# Patient Record
Sex: Female | Born: 1940
Health system: Southern US, Community
[De-identification: ages and names within clinical notes are randomized; demographics above are authoritative.]

## PROBLEM LIST (undated history)

## (undated) ENCOUNTER — Emergency Department (HOSPITAL_BASED_OUTPATIENT_CLINIC_OR_DEPARTMENT_OTHER): Admission: EM | Payer: Medicare PPO

## (undated) DIAGNOSIS — Z8719 Personal history of other diseases of the digestive system: Secondary | ICD-10-CM

## (undated) DIAGNOSIS — L409 Psoriasis, unspecified: Secondary | ICD-10-CM

## (undated) DIAGNOSIS — K219 Gastro-esophageal reflux disease without esophagitis: Secondary | ICD-10-CM

## (undated) DIAGNOSIS — I10 Essential (primary) hypertension: Secondary | ICD-10-CM

## (undated) DIAGNOSIS — M069 Rheumatoid arthritis, unspecified: Secondary | ICD-10-CM

## (undated) DIAGNOSIS — E119 Type 2 diabetes mellitus without complications: Secondary | ICD-10-CM

## (undated) DIAGNOSIS — S060X9A Concussion with loss of consciousness of unspecified duration, initial encounter: Secondary | ICD-10-CM

## (undated) DIAGNOSIS — W19XXXA Unspecified fall, initial encounter: Secondary | ICD-10-CM

## (undated) DIAGNOSIS — C801 Malignant (primary) neoplasm, unspecified: Secondary | ICD-10-CM

## (undated) DIAGNOSIS — G629 Polyneuropathy, unspecified: Secondary | ICD-10-CM

## (undated) DIAGNOSIS — L4050 Arthropathic psoriasis, unspecified: Secondary | ICD-10-CM

## (undated) DIAGNOSIS — IMO0001 Reserved for inherently not codable concepts without codable children: Secondary | ICD-10-CM

## (undated) DIAGNOSIS — Z923 Personal history of irradiation: Secondary | ICD-10-CM

## (undated) DIAGNOSIS — C3411 Malignant neoplasm of upper lobe, right bronchus or lung: Secondary | ICD-10-CM

## (undated) DIAGNOSIS — E1140 Type 2 diabetes mellitus with diabetic neuropathy, unspecified: Secondary | ICD-10-CM

## (undated) HISTORY — DX: Malignant (primary) neoplasm, unspecified: C80.1

## (undated) HISTORY — DX: Malignant neoplasm of upper lobe, right bronchus or lung: C34.11

## (undated) HISTORY — DX: Rheumatoid arthritis, unspecified: M06.9

## (undated) HISTORY — PX: CHOLECYSTECTOMY: SHX55

## (undated) HISTORY — PX: ABDOMINAL HYSTERECTOMY: SHX81

## (undated) HISTORY — PX: TONSILLECTOMY: SUR1361

## (undated) HISTORY — DX: Polyneuropathy, unspecified: G62.9

## (undated) HISTORY — DX: Essential (primary) hypertension: I10

## (undated) HISTORY — DX: Type 2 diabetes mellitus with diabetic neuropathy, unspecified: E11.40

## (undated) HISTORY — PX: DILATION AND CURETTAGE OF UTERUS: SHX78

## (undated) HISTORY — DX: Arthropathic psoriasis, unspecified: L40.50

## (undated) HISTORY — DX: Type 2 diabetes mellitus without complications: E11.9

## (undated) HISTORY — DX: Concussion with loss of consciousness of unspecified duration, initial encounter: S06.0X9A

## (undated) HISTORY — DX: Unspecified fall, initial encounter: W19.XXXA

## (undated) HISTORY — PX: FRACTURE SURGERY: SHX138

## (undated) HISTORY — DX: Personal history of other diseases of the digestive system: Z87.19

## (undated) HISTORY — PX: ABDOMINAL SURGERY: SHX537

---

## 1998-10-05 ENCOUNTER — Other Ambulatory Visit: Admission: RE | Admit: 1998-10-05 | Discharge: 1998-10-05 | Payer: Self-pay | Admitting: Obstetrics & Gynecology

## 2000-05-08 ENCOUNTER — Other Ambulatory Visit: Admission: RE | Admit: 2000-05-08 | Discharge: 2000-05-08 | Payer: Self-pay | Admitting: Obstetrics & Gynecology

## 2000-07-21 ENCOUNTER — Encounter: Payer: Self-pay | Admitting: Endocrinology

## 2000-07-21 ENCOUNTER — Encounter: Admission: RE | Admit: 2000-07-21 | Discharge: 2000-07-21 | Payer: Self-pay | Admitting: Endocrinology

## 2000-08-25 ENCOUNTER — Encounter: Payer: Self-pay | Admitting: General Surgery

## 2000-08-28 ENCOUNTER — Observation Stay (HOSPITAL_COMMUNITY): Admission: RE | Admit: 2000-08-28 | Discharge: 2000-08-29 | Payer: Self-pay | Admitting: General Surgery

## 2001-06-21 ENCOUNTER — Encounter: Admission: RE | Admit: 2001-06-21 | Discharge: 2001-07-26 | Payer: Self-pay | Admitting: Endocrinology

## 2001-08-20 ENCOUNTER — Other Ambulatory Visit: Admission: RE | Admit: 2001-08-20 | Discharge: 2001-08-20 | Payer: Self-pay | Admitting: Obstetrics & Gynecology

## 2002-02-11 ENCOUNTER — Emergency Department (HOSPITAL_COMMUNITY): Admission: EM | Admit: 2002-02-11 | Discharge: 2002-02-11 | Payer: Self-pay | Admitting: Emergency Medicine

## 2002-09-12 ENCOUNTER — Other Ambulatory Visit: Admission: RE | Admit: 2002-09-12 | Discharge: 2002-09-12 | Payer: Self-pay | Admitting: Obstetrics & Gynecology

## 2003-10-01 ENCOUNTER — Other Ambulatory Visit: Admission: RE | Admit: 2003-10-01 | Discharge: 2003-10-01 | Payer: Self-pay | Admitting: Obstetrics & Gynecology

## 2004-10-08 ENCOUNTER — Other Ambulatory Visit: Admission: RE | Admit: 2004-10-08 | Discharge: 2004-10-08 | Payer: Self-pay | Admitting: Obstetrics & Gynecology

## 2005-11-10 ENCOUNTER — Other Ambulatory Visit: Admission: RE | Admit: 2005-11-10 | Discharge: 2005-11-10 | Payer: Self-pay | Admitting: Obstetrics & Gynecology

## 2012-05-09 ENCOUNTER — Encounter: Payer: Medicare Other | Attending: Endocrinology | Admitting: Dietician

## 2012-05-09 DIAGNOSIS — E119 Type 2 diabetes mellitus without complications: Secondary | ICD-10-CM | POA: Insufficient documentation

## 2012-05-09 DIAGNOSIS — Z713 Dietary counseling and surveillance: Secondary | ICD-10-CM | POA: Insufficient documentation

## 2012-05-09 NOTE — Progress Notes (Signed)
  Medical Nutrition Therapy:  Appt start time: 1000 end time:  1115.   Assessment:  Primary concerns today: To help with taking care of my diabetes.  New onset DM 2.  Diagnosed approximately 3 months ago. A1C on 04/03/2012 was 9.1%. Coming for self-care education. Gives no family history of DM. Has a long history of arthritis and the medications for controlling the pain and tissue damage.  Today, notes that she is 71 years old and has lived a full life and will deal with the diabetes the best that she can.  BLOOD GLUCOSE:  Not currently monitoring blood glucose levels and does not verbalize an interest in starting.  HYPOGLYCEMIA:  Does not give a history that includes the S/S or low blood glucose.  HYPERGLYCEMIA:  Gives only a history that includes being more tired than previously.  MEDICATIONS: Completed medication review.   DIETARY INTAKE:  Usual eating pattern includes 3 meals and 1-2 snacks per day.Aiming for 1750 calories.    24-hr recall:  B ( AM): 9:00 Egg salad sandwich on the whole wheat sandwich thins. OR Raisin Bran cereal with skim mil and small banana  Snk ( AM): 1 life saver OR a piced of fruit or almonds.   L ( PM): Cup of sugar free ice cream, 10 almonds.  OR egg salad sandwich, strawberries, almonds Snk ( PM): later afternoon, 2 cocktails of scotch and water. OR no snack or alcohol D ( PM): Club sandwich at [employer] , with mayo and a beer.OR Hamburger with onion, and steamed cauliflower, broccoli and carrots Snk ( PM): No sugar ice cream, almonds. Beverages: Water, skim milk   Usual physical activity: Not currently exercising.  Notes that she use to walk and is considering getting back to walking.  Estimated energy needs:Ht:66.5 in  WT: 182.2 lbs  BMI: 29 kg/m2  Adj Wt:  148 lb (67) 1400 calories 150-155 g carbohydrates 100-105 g protein 36-39 g fat  Progress Towards Goal(s):  In progress.   Nutritional Diagnosis:  [place]-2.1 Inpaired nutrition utilization As related to  blood glucose.  As evidenced by diagnosis of type 2 diabetes with an AlC of 9.1% on 04/13/12..    Intervention:  Nutrition Completed review of the carbohydrate restricted diet for lowering blood glucose and the other self-care measures to prevent the short and long-term complications of diabetes.  Handouts given during visit include:  Living Well with diabetes  Controlling blood glucose  Menu suggestions for a 45 gm CHO diet.  Snack list  Monitoring/Evaluation:  Dietary intake, exercise, and body weight 8-12 weeks.

## 2012-05-13 ENCOUNTER — Encounter: Payer: Self-pay | Admitting: Dietician

## 2012-05-13 NOTE — Patient Instructions (Signed)
   Continue to read your food labels.  Try to cut-out the majority of sugar from your diet.  If you are doing sweet items or desserts, then plan to do them with the meal.  Try to eat 3 meals of 2 meals and a large snack each day.  Try to have a protein source at all meals and snacks.  Plan to try to use the monounsaturated fats for the majority, have looked to have less oxidative damage potential in the past.  Aim for more vegetables and fruit each day.  More healthy food as you called it.  Try to get more fiber into your diet.  The vegetables and fruits will work.

## 2013-10-23 ENCOUNTER — Other Ambulatory Visit: Payer: Self-pay | Admitting: Endocrinology

## 2013-10-23 DIAGNOSIS — R911 Solitary pulmonary nodule: Secondary | ICD-10-CM

## 2013-10-25 ENCOUNTER — Ambulatory Visit
Admission: RE | Admit: 2013-10-25 | Discharge: 2013-10-25 | Disposition: A | Discharge status: Home / Self Care | Payer: Medicare Other | Source: Ambulatory Visit | Attending: Endocrinology | Admitting: Endocrinology

## 2013-10-25 DIAGNOSIS — R911 Solitary pulmonary nodule: Secondary | ICD-10-CM

## 2013-10-25 MED ORDER — IOHEXOL 300 MG/ML  SOLN
75.0000 mL | Freq: Once | INTRAMUSCULAR | Status: AC | PRN
  Administered 2013-10-25: 75 mL via INTRAVENOUS

## 2013-11-06 ENCOUNTER — Encounter: Payer: Self-pay | Admitting: Internal Medicine

## 2013-11-07 ENCOUNTER — Ambulatory Visit (INDEPENDENT_AMBULATORY_CARE_PROVIDER_SITE_OTHER): Payer: Medicare Other | Admitting: Internal Medicine

## 2013-11-07 ENCOUNTER — Encounter: Payer: Self-pay | Admitting: Internal Medicine

## 2013-11-07 VITALS — BP 126/80 | HR 71 | Ht 66.0 in | Wt 176.2 lb

## 2013-11-07 DIAGNOSIS — R918 Other nonspecific abnormal finding of lung field: Secondary | ICD-10-CM

## 2013-11-07 DIAGNOSIS — Z23 Encounter for immunization: Secondary | ICD-10-CM

## 2013-11-07 DIAGNOSIS — R222 Localized swelling, mass and lump, trunk: Secondary | ICD-10-CM

## 2013-11-07 NOTE — Patient Instructions (Signed)
#  Right lung mass with lymph node -Possibilites are infection related to humira, lung cancer and rheumatoid arthritis related  PLAn  - Do Spirometry test next few days  - Do PET scan next few days  - Will discuss with radiologist and based on PET scan set up biopsy next few weeks

## 2013-11-07 NOTE — Progress Notes (Signed)
Subjective:    Patient ID: Megan [name], female    DOB: 04-13-1941, 73 y.o.   MRN: [id number] PCP [name], MD  HPI   IOV 11/07/2013  73 year old female. Accompanied by friend Mr Monica Becton who it is ok to share all health info. She says mid October when in [place] she developed acute onset of dry cough. Vividly remebers exact moment when she had cough. Is mild, persistent, dry in quality and not associate with sinus drainage, wheeze, dyspnea, hemoptysis or weight loss. Followed with PMD and then resulted in CDXR and then CT showes large RML mass that is necrotic with subcarinal node. THerefore referred here.   Lung mass relevant hx   - rheumatoid arthtiris +. On humira x 5 years   -  reports that she quit smoking about 15 years ago. Her smoking use included Cigarettes. She has a 135 pack-year smoking history. She does not have any smokeless tobacco history on file.     CLINICAL DATA: Nonproductive cough, remote history of smoking,  abnormal pulmonary nodule reported on previous chest x-ray  EXAM:  CT CHEST WITH CONTRAST  TECHNIQUE:  Multidetector CT imaging of the chest was performed during  intravenous contrast administration.  CONTRAST: 70mL OMNIPAQUE IOHEXOL 300 MG/ML SOLN  COMPARISON: PA and lateral chest x-ray of October 22, 2013.  FINDINGS:  There is dense consolidation of the anterior inferior aspect of the  right upper lobe corresponding to the abnormal density on the chest  x-ray. No air bronchograms are demonstrated within it. There is  patchy increased density along its periphery. The consolidated lung  abuts the mediastinum and the anterior pleural surface and extends  posteromedially to the right hilum. No calcifications are  demonstrated within this consolidated lung. The dimensions are 6.5  cm transversely x 5.5 cm AP x 5 cm in superior inferior dimension.  Elsewhere within the lungs no abnormal masses or parenchymal  consolidation  are demonstrated. There is a calcified 3 mm diameter  subpleural nodule in the left lower lobe on image 39 of series 2.  At mediastinal window settings the cardiac chambers are normal in  size. There is subcarinal lymphadenopathy measuring 2.1 x 2.5 cm.  There is a lymph node measuring approximately 1.3 cm in diameter in  the anterior right hilum seen on image 25 of series 2. There are  calcified normal-sized left infrahilar lymph nodes. The thoracic  esophagus does not appear abnormally distended. There is no pleural  nor pericardial effusion. The caliber of the thoracic aorta is  normal. There are no bulky axillary lymph nodes.  Within the upper abdomen the observed portions of the liver, spleen,  and kidneys are normal in appearance. The thoracic vertebral bodies  are preserved in height. There is degenerative disc change at  multiple levels with calcification of the anterior longitudinal  ligament. The sternum and retrosternal soft tissues appear normal.  IMPRESSION:  1. There is a irregularly marginated parenchymal consolidation or  soft tissue density mass in the anterior inferior aspect of the  right upper lobe. A borderline enlarged right hilar lymph node as  well as an enlarged subcarinal lymph node are demonstrated. The  findings are worrisome for malignancy although certainly atypical  infection could be present.  2. There is no pleural or pericardial effusion. There is no evidence  of CHF.  3. There is evidence of previous granulomatous infection on the  left.  Electronically Signed  By: [name] [place]  On: 10/25/2013  17:25     Past Medical History  Diagnosis Date  . Hypertension   . Diabetes mellitus   . Rheumatoid arthritis      Family History  Problem Relation Age of Onset  . Hypertension Other   . Stroke Other   . Heart attack Other   . Rheum arthritis      both sets of grandparents and father     History   Social History  . Marital Status: Divorced     Spouse Name: N/A    Number of Children: N/A  . Years of Education: N/A   Occupational History  . Not on file.   Social History Main Topics  . Smoking status: Former Smoker -- 3.00 packs/day for 45 years    Types: Cigarettes    Quit date: 12/05/1997  . Smokeless tobacco: Not on file  . Alcohol Use: Yes  . Drug Use: No  . Sexual Activity: Not on file   Other Topics Concern  . Not on file   Social History Narrative  . No narrative on file     Allergies  Allergen Reactions  . Remicade [Infliximab]      Outpatient Prescriptions Prior to Visit  Medication Sig Dispense Refill  . Adalimumab (HUMIRA [place]) Inject into the skin every 30 (thirty) days. Taking monthly or every two weks      . BIOTIN PO Take 1 tablet by mouth daily.      . clotrimazole (LOTRIMIN) 1 % cream Apply topically daily.      . Coenzyme Q10 (CO Q 10 PO) Take 1 capsule by mouth daily.      . Cyanocobalamin (B-12 PO) Take 1 tablet by mouth daily.      [name] losartan-hydrochlorothiazide (HYZAAR) 100-25 MG per tablet Take 1 tablet by mouth daily.      . metFORMIN (GLUCOPHAGE) 500 MG tablet Take 500 mg by mouth 2 (two) times daily with a meal.      . aspirin 325 MG tablet Take 325 mg by mouth as needed. As need at bedtime for pain.      [name] enalapril-hydrochlorothiazide (VASERETIC) 10-25 MG per tablet Take 1 tablet by mouth daily.      [name] estrogen-methylTESTOSTERone 0.625-1.25 MG per tablet Take 1 tablet by mouth daily.       No facility-administered medications prior to visit.      Review of Systems  Constitutional: Negative for fever and unexpected weight change.  HENT: Negative for congestion, dental problem, ear pain, nosebleeds, postnasal drip, rhinorrhea, sinus pressure, sneezing, sore throat and trouble swallowing.   Eyes: Negative for redness and itching.  Respiratory: Positive for cough. Negative for chest tightness, shortness of breath and wheezing.   Cardiovascular: Negative for palpitations and leg  swelling.  Gastrointestinal: Negative for nausea and vomiting.  Genitourinary: Negative for dysuria.  Musculoskeletal: Negative for joint swelling.  Skin: Negative for rash.  Neurological: Negative for headaches.  Hematological: Does not bruise/bleed easily.  Psychiatric/Behavioral: Negative for dysphoric mood. The patient is not nervous/anxious.        Objective:   Physical Exam  Vitals reviewed. Constitutional: She is oriented to person, place, and time. She appears well-developed and well-nourished. No distress.  HENT:  Head: Normocephalic and atraumatic.  Right Ear: External ear normal.  Left Ear: External ear normal.  Mouth/Throat: Oropharynx is clear and moist. No oropharyngeal exudate.  Eyes: Conjunctivae and EOM are normal. Pupils are equal, round, and reactive to light. Right eye exhibits no discharge. Left eye  exhibits no discharge. No scleral icterus.  Neck: Normal range of motion. Neck supple. No JVD present. No tracheal deviation present. No thyromegaly present.  Cardiovascular: Normal rate, regular rhythm, normal heart sounds and intact distal pulses.  Exam reveals no gallop and no friction rub.   No murmur heard. Pulmonary/Chest: Effort normal and breath sounds normal. No respiratory distress. She has no wheezes. She has no rales. She exhibits no tenderness.  Abdominal: Soft. Bowel sounds are normal. She exhibits no distension and no mass. There is no tenderness. There is no rebound and no guarding.  Musculoskeletal: Normal range of motion. She exhibits no edema and no tenderness.  Lymphadenopathy:    She has no cervical adenopathy.  Neurological: She is alert and oriented to person, place, and time. She has normal reflexes. No cranial nerve deficit. She exhibits normal muscle tone. Coordination normal.  Skin: Skin is warm and dry. No rash noted. She is not diaphoretic. No erythema. No pallor.  Psychiatric: She has a normal mood and affect. Her behavior is normal.  Judgment and thought content normal.          Assessment & Plan:

## 2013-11-09 ENCOUNTER — Encounter: Payer: Self-pay | Admitting: Internal Medicine

## 2013-11-09 ENCOUNTER — Telehealth: Payer: Self-pay | Admitting: Internal Medicine

## 2013-11-09 DIAGNOSIS — R918 Other nonspecific abnormal finding of lung field: Secondary | ICD-10-CM | POA: Insufficient documentation

## 2013-11-09 DIAGNOSIS — Z23 Encounter for immunization: Secondary | ICD-10-CM | POA: Insufficient documentation

## 2013-11-09 NOTE — Assessment & Plan Note (Signed)
#  Right lung mass with lymph node -Possibilites are infection related to humira, lung cancer and rheumatoid arthritis related  PLAn  - Do Spirometry test next few days  - Do PET scan next few days  - Will discuss with radiologist and based on PET scan set up biopsy next few weeks

## 2013-11-09 NOTE — Telephone Encounter (Signed)
PET scan 11/14/13. Arrange EBUS   Dr. Brand Males, M.D., [facility].C.P Pulmonary and Critical Care Medicine Staff Physician [place] [facility] Pager: [phone], If no answer or between  15:00h - 7:00h: call [phone]  11/09/2013 3:56 PM

## 2013-11-11 NOTE — Telephone Encounter (Signed)
I have her for EBUS 11/18/13 at 7.30am at [facility] long provisionally for EBUS bronch. This is basedo n PET results 11/14/13. Let patient know that after PET I will call and confirm this date

## 2013-11-12 ENCOUNTER — Encounter (HOSPITAL_COMMUNITY): Payer: Self-pay | Admitting: Pharmacy Technician

## 2013-11-12 ENCOUNTER — Encounter (HOSPITAL_COMMUNITY): Payer: Self-pay | Admitting: *Deleted

## 2013-11-12 NOTE — Telephone Encounter (Signed)
ATC, line busy, WCB. [place] Bing, CMA

## 2013-11-13 ENCOUNTER — Ambulatory Visit (HOSPITAL_COMMUNITY)
Admission: RE | Admit: 2013-11-13 | Discharge: 2013-11-13 | Disposition: A | Discharge status: Home / Self Care | Payer: Medicare Other | Source: Ambulatory Visit | Attending: Internal Medicine | Admitting: Internal Medicine

## 2013-11-13 DIAGNOSIS — R059 Cough, unspecified: Secondary | ICD-10-CM | POA: Insufficient documentation

## 2013-11-13 DIAGNOSIS — R05 Cough: Secondary | ICD-10-CM | POA: Insufficient documentation

## 2013-11-13 DIAGNOSIS — R222 Localized swelling, mass and lump, trunk: Secondary | ICD-10-CM | POA: Insufficient documentation

## 2013-11-13 DIAGNOSIS — R918 Other nonspecific abnormal finding of lung field: Secondary | ICD-10-CM

## 2013-11-13 DIAGNOSIS — Z87891 Personal history of nicotine dependence: Secondary | ICD-10-CM | POA: Insufficient documentation

## 2013-11-13 LAB — PULMONARY FUNCTION TEST
DL/VA % pred: 68 %
DL/VA: 3.37 ml/min/mmHg/L
DLCO unc % pred: 67 %
DLCO unc: 17.32 ml/min/mmHg
FEF 25-75 POST: 2.33 L/s
FEF 25-75 Pre: 1.96 L/sec
FEF2575-%CHANGE-POST: 18 %
FEF2575-%Pred-Post: 124 %
FEF2575-%Pred-Pre: 104 %
FEV1-%Change-Post: 3 %
FEV1-%PRED-POST: 105 %
FEV1-%PRED-PRE: 102 %
FEV1-POST: 2.43 L
FEV1-Pre: 2.35 L
FEV1FVC-%CHANGE-POST: 1 %
FEV1FVC-%PRED-PRE: 101 %
FEV6-%CHANGE-POST: 2 %
FEV6-%PRED-PRE: 104 %
FEV6-%Pred-Post: 106 %
FEV6-Post: 3.11 L
FEV6-Pre: 3.03 L
FEV6FVC-%PRED-POST: 105 %
FEV6FVC-%Pred-Pre: 105 %
FVC-%CHANGE-POST: 2 %
FVC-%Pred-Post: 101 %
FVC-%Pred-Pre: 99 %
FVC-Post: 3.11 L
FVC-Pre: 3.05 L
POST FEV1/FVC RATIO: 78 %
PRE FEV1/FVC RATIO: 77 %
PRE FEV6/FVC RATIO: 100 %
Post FEV6/FVC ratio: 100 %
RV % PRED: 106 %
RV: 2.42 L
TLC % pred: 109 %
TLC: 5.72 L

## 2013-11-13 MED ORDER — ALBUTEROL SULFATE (2.5 MG/3ML) 0.083% IN NEBU
2.5000 mg | INHALATION_SOLUTION | Freq: Once | RESPIRATORY_TRACT | Status: AC
  Administered 2013-11-13: 2.5 mg via RESPIRATORY_TRACT

## 2013-11-14 ENCOUNTER — Ambulatory Visit (HOSPITAL_COMMUNITY)
Admission: RE | Admit: 2013-11-14 | Discharge: 2013-11-14 | Disposition: A | Discharge status: Home / Self Care | Payer: Medicare Other | Source: Ambulatory Visit | Attending: Internal Medicine | Admitting: Internal Medicine

## 2013-11-14 ENCOUNTER — Telehealth: Payer: Self-pay | Admitting: Internal Medicine

## 2013-11-14 DIAGNOSIS — C771 Secondary and unspecified malignant neoplasm of intrathoracic lymph nodes: Secondary | ICD-10-CM | POA: Insufficient documentation

## 2013-11-14 DIAGNOSIS — E041 Nontoxic single thyroid nodule: Secondary | ICD-10-CM | POA: Insufficient documentation

## 2013-11-14 DIAGNOSIS — R918 Other nonspecific abnormal finding of lung field: Secondary | ICD-10-CM

## 2013-11-14 DIAGNOSIS — R222 Localized swelling, mass and lump, trunk: Secondary | ICD-10-CM | POA: Insufficient documentation

## 2013-11-14 LAB — GLUCOSE, CAPILLARY: Glucose-Capillary: 146 mg/dL — ABNORMAL HIGH (ref 70–99)

## 2013-11-14 MED ORDER — FLUDEOXYGLUCOSE F - 18 (FDG) INJECTION
17.1000 | Freq: Once | INTRAVENOUS | Status: AC | PRN
  Administered 2013-11-14: 17.1 via INTRAVENOUS

## 2013-11-14 NOTE — Telephone Encounter (Signed)
PFt 11/13/13 - isolated low dlco 67%  PET scan - left supraclav nodue + subcarinal node _ RML mass. D/w DR Hoss he feels IR can do left supraclav bx which would be best place due to highest staging  D/w patient -    - cancel bronch/ebus  Monday 11/15/13 - patient knows. plese tell bronch lab  - pleas set up IR guided core biipsy of left supraclav node ASAP next few days  - fu to see me or TP 3 days after bxc  THanks  Dr. Brand Males, M.D., [facility].C.P Pulmonary and Critical Care Medicine Staff Physician [place] [facility] Pager: [phone], If no answer or between  15:00h - 7:00h: call [phone]  11/14/2013 1:51 PM       Nm Pet Image Initial (pi) Skull Base To Thigh  11/14/2013   CLINICAL DATA:  Initial treatment strategy for right lung mass with lymph nodes.  EXAM: NUCLEAR MEDICINE PET SKULL BASE TO THIGH  FASTING BLOOD GLUCOSE:  Value: 146mg /dl  TECHNIQUE: 17.1 mCi F-18 FDG was injected intravenously. CT data was obtained and used for attenuation correction and anatomic localization only. (This was not acquired as a diagnostic CT examination.) Additional exam technical data entered on technologist worksheet.  COMPARISON:  CT CHEST W/CM dated 10/25/2013  FINDINGS: NECK  There are hypermetabolic left supraclavicular lymph nodes with adjacent 15 mm and 12 mm short axis lymph nodes (image 51 and 50) with SUV max 11.0. No additional hypermetabolic lymph nodes in the neck. There is nodular enlargement of the left lobe of thyroid gland to 21 mm. No metabolic activity is most consistent with a benign thyroid nodule.  CHEST  There is a hypermetabolic right middle lobe mass measuring 6.8 x 7.4 cm with SUV max 10.8. The hypermetabolic activity is a peripheral within the mass suggesting some central necrosis. No additional hypermetabolic nodules in the left lung or right lower lobe.  There is a hypermetabolic subcarinal lymph node between the esophagus and  bronchus intermedius measuring 17 mm short axis (image 85) with SUV max 9.0.  There is focal metabolic activity within the lumen of the distal esophagus (image 103) without mass lesion.  ABDOMEN/PELVIS  No abnormal hypermetabolic activity within the liver, pancreas, adrenal glands, or spleen. No hypermetabolic lymph nodes in the abdomen or pelvis.  SKELETON  No focal hypermetabolic activity to suggest skeletal metastasis.  IMPRESSION: 1. Hypermetabolic right middle lobe mass consists with primary bronchogenic carcinoma. 2. Contralateral hypermetabolic supraclavicular lymph nodes consistent with nodal metastasis. 3. Hypermetabolic subcarinal nodal metastasis. 4. No evidence of or distant metastasis. Focal metabolic activity in distal esophagus is likely physiologic. 5. Staging by FDG PET imaging TT3 NN3 M0   Electronically Signed   By: Suzy Bouchard M.D.   On: 11/14/2013 10:50

## 2013-11-14 NOTE — Telephone Encounter (Signed)
Nothing further needed, see 11-14-13 phone note. [place] Bing, CMA

## 2013-11-14 NOTE — Telephone Encounter (Signed)
Returning call can be reached at [phone].Megan [name]

## 2013-11-14 NOTE — Telephone Encounter (Signed)
lmomtcb x1 

## 2013-11-14 NOTE — Telephone Encounter (Signed)
Order placed. Megan [name], CMA  

## 2013-11-18 ENCOUNTER — Encounter (HOSPITAL_COMMUNITY): Payer: Medicare Other

## 2013-11-18 ENCOUNTER — Ambulatory Visit (HOSPITAL_COMMUNITY): Admission: RE | Admit: 2013-11-18 | Payer: Medicare Other | Source: Ambulatory Visit | Admitting: Internal Medicine

## 2013-11-18 HISTORY — DX: Gastro-esophageal reflux disease without esophagitis: K21.9

## 2013-11-18 SURGERY — ENDOBRONCHIAL ULTRASOUND (EBUS)
Anesthesia: General | Laterality: Bilateral

## 2013-11-18 NOTE — Telephone Encounter (Signed)
Have you guys scheduled with biopsy? Pt is wanting to know when this will be. Thanks.

## 2013-11-18 NOTE — Telephone Encounter (Signed)
Pt states she is calling regarding a biopsy that was to have been resched.  Megan [name]

## 2013-11-19 ENCOUNTER — Other Ambulatory Visit (HOSPITAL_COMMUNITY): Payer: Self-pay | Admitting: Radiology

## 2013-11-19 ENCOUNTER — Telehealth: Payer: Self-pay | Admitting: Internal Medicine

## 2013-11-19 NOTE — Telephone Encounter (Signed)
I spoke with the pt and she states she noticed a lump in her breast and wanted to know would this have shown in PET scan. I advised it is a full body scan so it would have scanned the area. I advised she should follow with her GYN about this lump and let them know she has a scan and they can review and give recs if needed. [place] Bing, CMA

## 2013-11-19 NOTE — Telephone Encounter (Signed)
We do not schedule the bx the radiology dept does pt will be contacted by them Megan [name]

## 2013-11-20 ENCOUNTER — Other Ambulatory Visit: Payer: Self-pay | Admitting: Obstetrics & Gynecology

## 2013-11-20 DIAGNOSIS — N644 Mastodynia: Secondary | ICD-10-CM

## 2013-11-21 ENCOUNTER — Encounter (HOSPITAL_COMMUNITY): Payer: Self-pay

## 2013-11-21 ENCOUNTER — Ambulatory Visit (HOSPITAL_COMMUNITY)
Admission: RE | Admit: 2013-11-21 | Discharge: 2013-11-21 | Disposition: A | Discharge status: Home / Self Care | Payer: Medicare Other | Source: Ambulatory Visit | Attending: Internal Medicine | Admitting: Internal Medicine

## 2013-11-21 DIAGNOSIS — C801 Malignant (primary) neoplasm, unspecified: Secondary | ICD-10-CM | POA: Insufficient documentation

## 2013-11-21 DIAGNOSIS — R918 Other nonspecific abnormal finding of lung field: Secondary | ICD-10-CM

## 2013-11-21 DIAGNOSIS — K219 Gastro-esophageal reflux disease without esophagitis: Secondary | ICD-10-CM | POA: Insufficient documentation

## 2013-11-21 DIAGNOSIS — Z90710 Acquired absence of both cervix and uterus: Secondary | ICD-10-CM | POA: Insufficient documentation

## 2013-11-21 DIAGNOSIS — C770 Secondary and unspecified malignant neoplasm of lymph nodes of head, face and neck: Secondary | ICD-10-CM | POA: Insufficient documentation

## 2013-11-21 DIAGNOSIS — E119 Type 2 diabetes mellitus without complications: Secondary | ICD-10-CM | POA: Insufficient documentation

## 2013-11-21 DIAGNOSIS — M069 Rheumatoid arthritis, unspecified: Secondary | ICD-10-CM | POA: Insufficient documentation

## 2013-11-21 DIAGNOSIS — Z79899 Other long term (current) drug therapy: Secondary | ICD-10-CM | POA: Insufficient documentation

## 2013-11-21 DIAGNOSIS — Z9089 Acquired absence of other organs: Secondary | ICD-10-CM | POA: Insufficient documentation

## 2013-11-21 DIAGNOSIS — R222 Localized swelling, mass and lump, trunk: Secondary | ICD-10-CM | POA: Insufficient documentation

## 2013-11-21 DIAGNOSIS — I10 Essential (primary) hypertension: Secondary | ICD-10-CM | POA: Insufficient documentation

## 2013-11-21 DIAGNOSIS — Z87891 Personal history of nicotine dependence: Secondary | ICD-10-CM | POA: Insufficient documentation

## 2013-11-21 LAB — APTT: aPTT: 27 seconds (ref 24–37)

## 2013-11-21 LAB — GLUCOSE, CAPILLARY: GLUCOSE-CAPILLARY: 115 mg/dL — AB (ref 70–99)

## 2013-11-21 LAB — CBC
HEMATOCRIT: 42.3 % (ref 36.0–46.0)
HEMOGLOBIN: 14.6 g/dL (ref 12.0–15.0)
MCH: 32.3 pg (ref 26.0–34.0)
MCHC: 34.5 g/dL (ref 30.0–36.0)
MCV: 93.6 fL (ref 78.0–100.0)
Platelets: 204 10*3/uL (ref 150–400)
RBC: 4.52 MIL/uL (ref 3.87–5.11)
RDW: 14.9 % (ref 11.5–15.5)
WBC: 9 10*3/uL (ref 4.0–10.5)

## 2013-11-21 LAB — PROTIME-INR
INR: 0.94 (ref 0.00–1.49)
Prothrombin Time: 12.4 seconds (ref 11.6–15.2)

## 2013-11-21 MED ORDER — FENTANYL CITRATE 0.05 MG/ML IJ SOLN
INTRAMUSCULAR | Status: AC | PRN
  Administered 2013-11-21 (×3): 25 ug via INTRAVENOUS

## 2013-11-21 MED ORDER — MIDAZOLAM HCL 2 MG/2ML IJ SOLN
INTRAMUSCULAR | Status: AC
  Filled 2013-11-21: qty 4

## 2013-11-21 MED ORDER — SODIUM CHLORIDE 0.9 % IV SOLN
Freq: Once | INTRAVENOUS | Status: AC
  Administered 2013-11-21: 14:00:00 via INTRAVENOUS

## 2013-11-21 MED ORDER — MIDAZOLAM HCL 2 MG/2ML IJ SOLN
INTRAMUSCULAR | Status: AC | PRN
  Administered 2013-11-21 (×2): 0.5 mg via INTRAVENOUS
  Administered 2013-11-21: 1 mg via INTRAVENOUS

## 2013-11-21 MED ORDER — FENTANYL CITRATE 0.05 MG/ML IJ SOLN
INTRAMUSCULAR | Status: AC
  Filled 2013-11-21: qty 4

## 2013-11-21 NOTE — Procedures (Signed)
Procedure:  Ultrasound guided core biopsy of left supraclavicular lymph node Findings:  Left supraclavicular lymph node sampled with 18 G core biopsy x 6. No complications.

## 2013-11-21 NOTE — Discharge Instructions (Signed)
Needle Biopsy Care After These instructions give you information on caring for yourself after your procedure. Your doctor may also give you more specific instructions. Call your doctor if you have any problems or questions after your procedure. HOME CARE  Rest for 4 hours after your biopsy, except for getting up to go to the bathroom or as told.  Keep the places where the needles were put in clean and dry.  Do not put powder or lotion on the sites.  Do not shower until 24 hours after the test. Remove all bandages (dressings) before showering.  Remove all bandages at least once every day. Gently clean the sites with soap and water. Keep putting a new bandage on until the skin is closed. Finding out the results of your test Ask your doctor when your test results will be ready. Make sure you follow up and get the test results. GET HELP RIGHT AWAY IF:   You have shortness of breath or trouble breathing.  You have pain or cramping in your belly (abdomen).  You feel sick to your stomach (nauseous) or throw up (vomit).  Any of the places where the needles were put in:  Are puffy (swollen) or red.  Are sore or hot to the touch.  Are draining yellowish-white fluid (pus).  Are bleeding after 10 minutes of pressing down on the site. Have someone keep pressing on any place that is bleeding until you see a doctor.  You have any unusual pain that will not stop.  You have a fever. If you go to the emergency room, tell the nurse that you had a biopsy. Take this paper with you to show the nurse. MAKE SURE YOU:   Understand these instructions.  Will watch your condition.  Will get help right away if you are not doing well or get worse. Document Released: 09/29/2008 Document Revised: 01/09/2012 Document Reviewed: 09/29/2008 [facility] Patient Information 2014 [place].

## 2013-11-21 NOTE — H&P (Signed)
Agree 

## 2013-11-21 NOTE — H&P (Signed)
Chief Complaint: "I'm here for a biopsy" Referring Physician:Ramaswamy HPI: Megan [name] is an 73 y.o. female who is found to have a right lung mass. Subsequent PET/CT shows activity in left sided supraclavicular nodes. IR is asked to biopsy this area for tissue diagnosis. PMHx, meds, imaging reviewed. Pt feels ok otherwise, no recent fevers, illness.  Past Medical History:  Past Medical History  Diagnosis Date  . Hypertension   . Diabetes mellitus   . Rheumatoid arthritis   . GERD (gastroesophageal reflux disease)     no meds for    Past Surgical History:  Past Surgical History  Procedure Laterality Date  . Cholecystectomy    . Abdominal hysterectomy      Family History:  Family History  Problem Relation Age of Onset  . Hypertension Other   . Stroke Other   . Heart attack Other   . Rheum arthritis      both sets of grandparents and father    Social History:  reports that she quit smoking about 15 years ago. Her smoking use included Cigarettes. She has a 135 pack-year smoking history. She has never used smokeless tobacco. She reports that she drinks alcohol. She reports that she does not use illicit drugs.  Allergies:  Allergies  Allergen Reactions  . Remicade [Infliximab] Anaphylaxis    Medications:   Medication List    ASK your doctor about these medications       B-12 PO  Take 1 tablet by mouth daily.     BIOTIN PO  Take 1 tablet by mouth daily.     calcipotriene-betamethasone ointment  Commonly known as:  TACLONEX  Apply 1 application topically daily as needed (rash).     cephALEXin 500 MG capsule  Commonly known as:  KEFLEX  Take 500 mg by mouth 4 (four) times daily.     clotrimazole 1 % cream  Commonly known as:  LOTRIMIN  Apply 1 application topically daily.     CO Q 10 PO  Take 1 capsule by mouth daily.     HUMIRA [place]  Inject 40 mg into the skin See admin instructions. Every other week     losartan-hydrochlorothiazide 100-25 MG per  tablet  Commonly known as:  HYZAAR  Take 1 tablet by mouth daily.     metFORMIN 500 MG tablet  Commonly known as:  GLUCOPHAGE  Take 500 mg by mouth 2 (two) times daily with a meal.     VITAMIN D PO  Take 1 tablet by mouth daily.        Please HPI for pertinent positives, otherwise complete 10 system ROS negative.  Physical Exam: BP 129/62  Pulse 67  Temp(Src) 97.4 F (36.3 C) (Oral)  Resp 17  Ht 5' 6.5" (1.689 m)  Wt 173 lb (78.472 kg)  BMI 27.51 kg/m2  SpO2 98% Body mass index is 27.51 kg/(m^2).   General Appearance:  Alert, cooperative, no distress, appears stated age  Head:  Normocephalic, without obvious abnormality, atraumatic  ENT: Unremarkable  Neck: Supple, symmetrical, trachea midline  Lungs:   Clear to auscultation bilaterally, no w/r/r, respirations unlabored without use of accessory muscles.  Chest Wall:  No tenderness or deformity  Heart:  Regular rate and rhythm, S1, S2 normal, no murmur, rub or gallop.  Abdomen:   Soft, non-tender, non distended.  Extremities: Extremities normal, atraumatic, no cyanosis or edema  Pulses: 2+ and symmetric  Neurologic: Normal affect, no gross deficits.   No results found for this  or any previous visit (from the past 48 hour(s)). No results found.  Assessment/Plan Rt lung mass (L)supraclavicular adenopathy Discussed US guided biopsy of LN. Explained procedure, risks, complications, use of sedation. Labs pending. Consent signed in chart  Ascencion Dike PA-C 11/21/2013, 1:29 PM

## 2013-11-25 ENCOUNTER — Telehealth: Payer: Self-pay | Admitting: Internal Medicine

## 2013-11-25 NOTE — Telephone Encounter (Signed)
According to phone note 11/14/13: D/w patient -  - cancel bronch/ebus Monday 11/15/13 - patient knows. plese tell bronch lab  - pleas set up IR guided core biipsy of left supraclav node ASAP next few days  - fu to see me or TP 3 days after bxc   ATC pt fast busy singal x 3 wcb

## 2013-11-25 NOTE — Telephone Encounter (Signed)
ATC again, line busy. [place] Bing, CMA

## 2013-11-26 NOTE — Telephone Encounter (Signed)
ATC again and line is busy. WCB. [place] Bing, CMA

## 2013-11-26 NOTE — Telephone Encounter (Signed)
Pt has already had the ultrasound guided biopsy on 11/21/13. ROV has been scheduled with MR on 11/29/13 at 2:15pm. Nothing further was needed.

## 2013-11-27 ENCOUNTER — Telehealth: Payer: Self-pay | Admitting: Internal Medicine

## 2013-11-27 NOTE — Telephone Encounter (Signed)
ATC patient to schedule appointment tomorrow is she wanted it; had to leave message for patient to call the office in the morning; will forward to [facility] and MR so they are both aware.

## 2013-11-27 NOTE — Telephone Encounter (Signed)
Got call yesterday that bx posititve for  Lung cancer. Seeing her 11/29/13 which is fine but if she wants to come in [place] PM; please give her appt due to sensitive matter. Overbook if needed.  Please do not divulge dx due to sensitive matter  Sending to triage who can fwed to [place] if needed Dr. Brand Males, M.D., [facility].C.P Pulmonary and Critical Care Medicine Staff Physician [place] [facility] Pager: [phone], If no answer or between  15:00h - 7:00h: call [phone]  11/27/2013 5:37 PM

## 2013-11-28 ENCOUNTER — Ambulatory Visit (INDEPENDENT_AMBULATORY_CARE_PROVIDER_SITE_OTHER): Payer: Medicare Other | Admitting: Internal Medicine

## 2013-11-28 ENCOUNTER — Encounter: Payer: Self-pay | Admitting: Internal Medicine

## 2013-11-28 VITALS — BP 140/88 | HR 72 | Ht 66.0 in | Wt 177.4 lb

## 2013-11-28 DIAGNOSIS — C3411 Malignant neoplasm of upper lobe, right bronchus or lung: Secondary | ICD-10-CM

## 2013-11-28 DIAGNOSIS — C3490 Malignant neoplasm of unspecified part of unspecified bronchus or lung: Secondary | ICD-10-CM

## 2013-11-28 HISTORY — DX: Malignant neoplasm of upper lobe, right bronchus or lung: C34.11

## 2013-11-28 NOTE — Addendum Note (Signed)
Addended by: Lilli Few on: 11/28/2013 01:58 PM   Modules accepted: Orders

## 2013-11-28 NOTE — Telephone Encounter (Signed)
appt set for today at 1:30pm. [name], [name]

## 2013-11-28 NOTE — Patient Instructions (Signed)
Please see Dr Earlie Server for lung cancer Try dulera 2 puff twice daily for cough, if cough does not go away return in 3 months  Followup  3 months as needed if cough does not go away; call us at that time

## 2013-11-28 NOTE — Progress Notes (Signed)
   Subjective:    Patient ID: Megan [name], female    DOB: November 19, 1940, 73 y.o.   MRN: [id number]  HPI   PET scan - suggestive of T3, N3, MO (stage 3B)  lesion NSCLC presumed - RML > 7cm mass, subcarinal node, contralateral left supraclav node  Sp LEft supraclav node bx 1/12/03/13 - here for results - shows Squamous Cell CA  Review of Systems  Constitutional: Negative for fever and unexpected weight change.  HENT: Negative for congestion, dental problem, ear pain, nosebleeds, postnasal drip, rhinorrhea, sinus pressure, sneezing, sore throat and trouble swallowing.   Eyes: Negative for redness and itching.  Respiratory: Negative for cough, chest tightness, shortness of breath and wheezing.   Cardiovascular: Negative for palpitations and leg swelling.  Gastrointestinal: Negative for nausea and vomiting.  Genitourinary: Negative for dysuria.  Musculoskeletal: Negative for joint swelling.  Skin: Negative for rash.  Neurological: Negative for headaches.  Hematological: Does not bruise/bleed easily.  Psychiatric/Behavioral: Negative for dysphoric mood. The patient is not nervous/anxious.        Objective:   Physical Exam  Filed Vitals:   11/28/13 1338  BP: 140/88  Pulse: 72  Height: 5\' 6"  (1.676 m)  Weight: 177 lb 6.4 oz (80.468 kg)  SpO2: 96%    Discussion only visit      Assessment & Plan:

## 2013-11-28 NOTE — Assessment & Plan Note (Signed)
Stage 3B lung cancer Sq acell. ECOG 00-1 but has RA.  New of dx, stging and brief prognosis given. Dulera sample given fo cough. REturn sa needed. Refer DR Julien Nordmann. All questions answered  > 50% of this > 25 min visit spent in face to face counseling (15 min visit converted to 25 min)

## 2013-11-29 ENCOUNTER — Ambulatory Visit: Payer: Medicare Other | Admitting: Internal Medicine

## 2013-11-29 ENCOUNTER — Telehealth: Payer: Self-pay | Admitting: *Deleted

## 2013-11-29 NOTE — Telephone Encounter (Signed)
Called pt with appt for [place] 12/05/13.  Pt verbalized understanding of time and place of appt

## 2013-12-05 ENCOUNTER — Ambulatory Visit: Payer: Medicare Other | Attending: Internal Medicine | Admitting: Physical Therapy

## 2013-12-05 ENCOUNTER — Encounter: Payer: Self-pay | Admitting: *Deleted

## 2013-12-05 ENCOUNTER — Encounter: Payer: Self-pay | Admitting: Internal Medicine

## 2013-12-05 ENCOUNTER — Ambulatory Visit
Admission: RE | Admit: 2013-12-05 | Discharge: 2013-12-05 | Disposition: A | Discharge status: Home / Self Care | Payer: Medicare Other | Source: Ambulatory Visit | Attending: Radiation Oncology | Admitting: Radiation Oncology

## 2013-12-05 ENCOUNTER — Ambulatory Visit (HOSPITAL_BASED_OUTPATIENT_CLINIC_OR_DEPARTMENT_OTHER): Payer: Medicare Other | Admitting: Internal Medicine

## 2013-12-05 ENCOUNTER — Telehealth: Payer: Self-pay | Admitting: Internal Medicine

## 2013-12-05 VITALS — BP 140/60 | HR 73 | Temp 97.3°F | Resp 18 | Ht 66.0 in | Wt 179.2 lb

## 2013-12-05 DIAGNOSIS — E119 Type 2 diabetes mellitus without complications: Secondary | ICD-10-CM | POA: Insufficient documentation

## 2013-12-05 DIAGNOSIS — Z87891 Personal history of nicotine dependence: Secondary | ICD-10-CM | POA: Insufficient documentation

## 2013-12-05 DIAGNOSIS — IMO0001 Reserved for inherently not codable concepts without codable children: Secondary | ICD-10-CM | POA: Insufficient documentation

## 2013-12-05 DIAGNOSIS — R293 Abnormal posture: Secondary | ICD-10-CM | POA: Insufficient documentation

## 2013-12-05 DIAGNOSIS — C3410 Malignant neoplasm of upper lobe, unspecified bronchus or lung: Secondary | ICD-10-CM

## 2013-12-05 DIAGNOSIS — R0789 Other chest pain: Secondary | ICD-10-CM

## 2013-12-05 DIAGNOSIS — C760 Malignant neoplasm of head, face and neck: Secondary | ICD-10-CM | POA: Insufficient documentation

## 2013-12-05 DIAGNOSIS — M069 Rheumatoid arthritis, unspecified: Secondary | ICD-10-CM | POA: Insufficient documentation

## 2013-12-05 DIAGNOSIS — C3490 Malignant neoplasm of unspecified part of unspecified bronchus or lung: Secondary | ICD-10-CM

## 2013-12-05 DIAGNOSIS — R109 Unspecified abdominal pain: Secondary | ICD-10-CM

## 2013-12-05 DIAGNOSIS — C773 Secondary and unspecified malignant neoplasm of axilla and upper limb lymph nodes: Secondary | ICD-10-CM

## 2013-12-05 MED ORDER — PROCHLORPERAZINE MALEATE 10 MG PO TABS: 10.0000 mg | ORAL_TABLET | Freq: Four times a day (QID) | ORAL | Status: DC | PRN

## 2013-12-05 NOTE — Telephone Encounter (Signed)
gv and printed appt sched and avs for pt for Feba dn March....sed added tx and emailed MW to addtx.Marland KitchenMarland Kitchen

## 2013-12-05 NOTE — Progress Notes (Signed)
Gave pt appt time for sim 12/06/13 at 3:00.

## 2013-12-05 NOTE — Progress Notes (Signed)
[facility] [phone]   [phone]  CONSULT NOTE  REFERRING PHYSICIAN: Dr. Brand Males  REASON FOR CONSULTATION:  73 years old white female recently diagnosed with lung cancer.  HPI Megan [name] is a 73 y.o. female was past medical history significant for multiple medical problems including history of diabetes mellitus, hypertension, rheumatoid arthritis, GERD as well as long history of smoking but quit in 1999. The patient mentions that she has been complaining of increasing cough and weakness as well as shortness of breath since October of 2014. She was finally seen by her primary care physician, Dr. Wilson Singer and chest x-ray was performed which showed abnormality in the right lung. This was followed by CT scan of the chest with contrast on 10/25/2013 and it showed dense consolidation of the anterior inferior aspect of the right upper lobe corresponding to the abnormal density on the chest x-ray. No air bronchograms are demonstrated within it. There is patchy increased density along its periphery. The consolidated lung abuts the mediastinum and the anterior pleural surface and extends posteromedially to the right hilum. No calcifications are demonstrated within this consolidated lung. The dimensions are 6.5 cm transversely x 5.5 cm AP x 5 cm in superior inferior dimension. There is subcarinal lymphadenopathy measuring 2.1 x 2.5 cm. There is a lymph node measuring approximately 1.3 cm in diameter in the anterior right hilum. The patient was referred to Dr. Chase Caller and a PET scan was performed on 11/14/2013 and it showed hypermetabolic right middle lobe mass consistent with primary bronchogenic carcinoma with contralateral hypermetabolic supraclavicular lymph nodes consistent with nodal metastasis as well as hypermetabolic subcarinal nodal metastasis. There was no evidence of distant metastasis but there was focal metabolic activity in the distal  esophagus likely physiologic.  On 11/21/2013 the patient underwent ultrasound-guided core biopsy of the left supraclavicular lymph node by interventional radiology. The final pathology (Accession: [id number]) was consistent with metastatic squamous cell carcinoma. The patient was referred to me today for evaluation and recommendation regarding treatment of her condition. When seen today she continues to complain of tightness in her chest as well as shortness of breath increased with exertion in addition to sore throat. She denied having any significant weight loss or night sweats. She denied having any nausea or vomiting. Her family history significant for mother who died at age 22 with cerebral hemorrhage, father died at age 86 with a stroke. The patient is single and has no children. She used to work as a Pharmacist, hospital. She was accompanied today by a friend, [name]. She has a history of smoking 2-3 packs per day for around 45 years but quit 12/01/1997. She drinks alcohol occasionally and no history of drug abuse.    HPI  Past Medical History  Diagnosis Date  . Hypertension   . Diabetes mellitus   . Rheumatoid arthritis   . GERD (gastroesophageal reflux disease)     no meds for    Past Surgical History  Procedure Laterality Date  . Cholecystectomy    . Abdominal hysterectomy      Family History  Problem Relation Age of Onset  . Hypertension Other   . Stroke Other   . Heart attack Other   . Rheum arthritis      both sets of grandparents and father    Social History History  Substance Use Topics  . Smoking status: Former Smoker -- 3.00 packs/day for 45 years    Types: Cigarettes    Quit date:  12/05/1997  . Smokeless tobacco: Never Used  . Alcohol Use: Yes     Comment: occasional    Allergies  Allergen Reactions  . Remicade [Infliximab] Anaphylaxis    Current Outpatient Prescriptions  Medication Sig Dispense Refill  . Adalimumab (HUMIRA [employer]) Inject 40 mg into the skin See  admin instructions. Every other week      . BIOTIN PO Take 1 tablet by mouth daily.      . calcipotriene-betamethasone (TACLONEX) ointment Apply 1 application topically daily as needed (rash).       . Cholecalciferol (VITAMIN D PO) Take 1 tablet by mouth daily.      . clotrimazole (LOTRIMIN) 1 % cream Apply 1 application topically daily.       . Coenzyme Q10 (CO Q 10 PO) Take 1 capsule by mouth daily.      . Cyanocobalamin (B-12 PO) Take 1 tablet by mouth daily.      [name] losartan-hydrochlorothiazide (HYZAAR) 100-25 MG per tablet Take 1 tablet by mouth daily.      . metFORMIN (GLUCOPHAGE) 500 MG tablet Take 500 mg by mouth 2 (two) times daily with a meal.      . Nutritional Supplements (ESTROVEN PO) Take 1 tablet by mouth daily.       No current facility-administered medications for this visit.    Review of Systems  Constitutional: negative Eyes: negative Ears, nose, mouth, throat, and face: negative Respiratory: positive for cough, dyspnea on exertion and pleurisy/chest pain Cardiovascular: negative Gastrointestinal: negative Genitourinary:negative Integument/breast: negative Hematologic/lymphatic: negative Musculoskeletal:negative Neurological: negative Behavioral/Psych: negative Endocrine: negative Allergic/Immunologic: negative  Physical Exam  DJT:TSVXB, healthy, no distress, well nourished, well developed and anxious SKIN: skin color, texture, turgor are normal, no rashes or significant lesions HEAD: Normocephalic, No masses, lesions, tenderness or abnormalities EYES: normal, PERRLA EARS: External ears normal, Canals clear OROPHARYNX:no exudate, no erythema and lips, buccal mucosa, and tongue normal  NECK: supple, no adenopathy, no JVD LYMPH:  no palpable lymphadenopathy, no hepatosplenomegaly BREAST:not examined LUNGS: expiratory wheezes bilaterally HEART: regular rate & rhythm, no murmurs and no gallops ABDOMEN:abdomen soft, non-tender, normal bowel sounds and no  masses or organomegaly BACK: Back symmetric, no curvature., No CVA tenderness EXTREMITIES:no joint deformities, effusion, or inflammation, no edema, no skin discoloration  NEURO: alert & oriented x 3 with fluent speech, no focal motor/sensory deficits  PERFORMANCE STATUS: ECOG 1  LABORATORY DATA: Lab Results  Component Value Date   WBC 9.0 11/21/2013   HGB 14.6 11/21/2013   HCT 42.3 11/21/2013   MCV 93.6 11/21/2013   PLT 204 11/21/2013      Chemistry   No results found for this basename: NA, K, CL, CO2, BUN, CREATININE, GLU   No results found for this basename: CALCIUM, ALKPHOS, AST, ALT, BILITOT       RADIOGRAPHIC STUDIES: Nm Pet Image Initial (pi) Skull Base To Thigh  11/14/2013   CLINICAL DATA:  Initial treatment strategy for right lung mass with lymph nodes.  EXAM: NUCLEAR MEDICINE PET SKULL BASE TO THIGH  FASTING BLOOD GLUCOSE:  Value: 146mg /dl  TECHNIQUE: 17.1 mCi F-18 FDG was injected intravenously. CT data was obtained and used for attenuation correction and anatomic localization only. (This was not acquired as a diagnostic CT examination.) Additional exam technical data entered on technologist worksheet.  COMPARISON:  CT CHEST W/CM dated 10/25/2013  FINDINGS: NECK  There are hypermetabolic left supraclavicular lymph nodes with adjacent 15 mm and 12 mm short axis lymph nodes (image 51 and 50) with SUV  max 11.0. No additional hypermetabolic lymph nodes in the neck. There is nodular enlargement of the left lobe of thyroid gland to 21 mm. No metabolic activity is most consistent with a benign thyroid nodule.  CHEST  There is a hypermetabolic right middle lobe mass measuring 6.8 x 7.4 cm with SUV max 10.8. The hypermetabolic activity is a peripheral within the mass suggesting some central necrosis. No additional hypermetabolic nodules in the left lung or right lower lobe.  There is a hypermetabolic subcarinal lymph node between the esophagus and bronchus intermedius measuring 17 mm short  axis (image 85) with SUV max 9.0.  There is focal metabolic activity within the lumen of the distal esophagus (image 103) without mass lesion.  ABDOMEN/PELVIS  No abnormal hypermetabolic activity within the liver, pancreas, adrenal glands, or spleen. No hypermetabolic lymph nodes in the abdomen or pelvis.  SKELETON  No focal hypermetabolic activity to suggest skeletal metastasis.  IMPRESSION: 1. Hypermetabolic right middle lobe mass consists with primary bronchogenic carcinoma. 2. Contralateral hypermetabolic supraclavicular lymph nodes consistent with nodal metastasis. 3. Hypermetabolic subcarinal nodal metastasis. 4. No evidence of or distant metastasis. Focal metabolic activity in distal esophagus is likely physiologic. 5. Staging by FDG PET imaging TT3 NN3 M0   Electronically Signed   By: Suzy Bouchard M.D.   On: 11/14/2013 10:50   US Biopsy  11/21/2013   CLINICAL DATA:  Right middle lobe lung mass and hypermetabolic lymphadenopathy in the left supraclavicular region.  EXAM: ULTRASOUND GUIDED CORE BIOPSY OF LEFT SUPRACLAVICULAR LYMPH NODE  MEDICATIONS: 2.0 mg IV Versed; 75 mcg IV Fentanyl  Total Moderate Sedation Time: 20 min  PROCEDURE: The procedure, risks, benefits, and alternatives were explained to the patient. Questions regarding the procedure were encouraged and answered. The patient understands and consents to the procedure.  The left neck was prepped with Betadine in a sterile fashion, and a sterile drape was applied covering the operative field. A sterile gown and sterile gloves were used for the procedure. Local anesthesia was provided with 1% Lidocaine.  Ultrasound was used to localize left supraclavicular lymphadenopathy. Under ultrasound guidance, an 18 gauge core biopsy device was advanced. A total of 5 core biopsy samples were obtained and submitted in formalin.  COMPLICATIONS: None.  FINDINGS: There are two adjacent enlarged left supraclavicular lymph nodes which correspond to recent PET  findings. Both nodes were sampled. There were no complications.  IMPRESSION: Ultrasound-guided core biopsy performed of left supraclavicular lymphadenopathy.   Electronically Signed   By: Aletta Edouard M.D.   On: 11/21/2013 16:06    ASSESSMENT: This is a very pleasant 73 years old white female recently diagnosed with a stage IIIB (T3, N3, M0) non-small cell lung cancer consistent with squamous cell carcinoma presented with large right upper lobe mass with mediastinal lymphadenopathy as well as contralateral supraclavicular lymph nodes diagnosed in January of 2015.   PLAN: I had a lengthy discussion with the patient today about her current disease stage, prognosis and treatment options. I will complete the staging workup by ordering MRI of the brain to rule out brain metastasis. The patient understands that she is not a candidate for surgical resection. I discussed with the patient and her treatment options including a course of concurrent chemoradiation with weekly carboplatin for AUC of 2 and paclitaxel 45 mg/M2. I discussed with the patient adverse effect of the chemotherapy including but not limited to alopecia, myelosuppression, nausea and vomiting, peripheral neuropathy, liver or renal dysfunction. She was seen earlier today by Dr.  Moody for evaluation and discussion of her radiotherapy option. I will arrange for the patient to have a chemotherapy education class before the first cycle of her treatment. She is expected to start the first cycle of her treatment on 12/16/2013. I will call her pharmacy was prescription for Compazine 10 mg by mouth every 6 hours as needed for nausea. The patient would come back for follow up visit in 3 weeks for evaluation and management any adverse effects of her treatment. She is followed by Dr. Estanislado Pandy for her rheumatoid arthritis and the expected to start a new treatment with [place] N[place] which is an oral small Monopril inhibitor of phosphodiesterase-4 fo cAMP. It  is usually associated with significant adverse effect including diarrhea, nausea and upper respiratory infection as well as vomiting and nasopharyngitis. It can also increase the risk of depression and weight loss.  I do not think it would be a good idea to start this treatment at this point especially when the patient is taken concurrent therapy or radiation. Her rheumatologic symptoms may improve while she is taking immunosuppressive chemotherapy. The patient was advised to call immediately if she has any concerning symptoms and interval  The patient voices understanding of current disease status and treatment options and is in agreement with the current care plan.  All questions were answered. The patient knows to call the clinic with any problems, questions or concerns. We can certainly see the patient much sooner if necessary.  Thank you so much for allowing me to participate in the care of Megan [name]. I will continue to follow up the patient with you and assist in her care.  I spent 60 minutes counseling the patient face to face. The total time spent in the appointment was 80 minutes.  Disclaimer: This note was dictated with voice recognition software. Similar sounding words can inadvertently be transcribed and may not be corrected upon review.   Roman Dubuc K. 12/05/2013, 3:03 PM

## 2013-12-05 NOTE — Progress Notes (Signed)
[facility] Work  Clinical Social Work met with patient, patient's friend [name], and medical oncologist to offer support and assess for psychosocial needs.  The patient expressed anxiety surrounding treatments and "not knowing what to expect".  The MD answered all of patient's questions.  Ms. Venezia is single and reports having no family and limited support.  CSW and patient briefly discussed availability of home care services if needed.  CSW encouraged patient to contact CSW if she is interested in services.   Polo Riley, MSW, LCSW, OSW-C Clinical Social Worker [facility] [phone]

## 2013-12-06 ENCOUNTER — Ambulatory Visit
Admission: RE | Admit: 2013-12-06 | Discharge: 2013-12-06 | Disposition: A | Discharge status: Home / Self Care | Payer: Medicare Other | Source: Ambulatory Visit | Attending: Radiation Oncology | Admitting: Radiation Oncology

## 2013-12-06 ENCOUNTER — Telehealth: Payer: Self-pay | Admitting: Internal Medicine

## 2013-12-06 ENCOUNTER — Telehealth: Payer: Self-pay | Admitting: *Deleted

## 2013-12-06 DIAGNOSIS — R599 Enlarged lymph nodes, unspecified: Secondary | ICD-10-CM | POA: Insufficient documentation

## 2013-12-06 DIAGNOSIS — K209 Esophagitis, unspecified without bleeding: Secondary | ICD-10-CM | POA: Insufficient documentation

## 2013-12-06 DIAGNOSIS — C342 Malignant neoplasm of middle lobe, bronchus or lung: Secondary | ICD-10-CM

## 2013-12-06 DIAGNOSIS — Y842 Radiological procedure and radiotherapy as the cause of abnormal reaction of the patient, or of later complication, without mention of misadventure at the time of the procedure: Secondary | ICD-10-CM | POA: Insufficient documentation

## 2013-12-06 DIAGNOSIS — Z510 Encounter for antineoplastic radiation therapy: Secondary | ICD-10-CM | POA: Insufficient documentation

## 2013-12-06 DIAGNOSIS — L589 Radiodermatitis, unspecified: Secondary | ICD-10-CM | POA: Insufficient documentation

## 2013-12-06 DIAGNOSIS — R197 Diarrhea, unspecified: Secondary | ICD-10-CM | POA: Insufficient documentation

## 2013-12-06 DIAGNOSIS — J029 Acute pharyngitis, unspecified: Secondary | ICD-10-CM | POA: Insufficient documentation

## 2013-12-06 DIAGNOSIS — R509 Fever, unspecified: Secondary | ICD-10-CM | POA: Insufficient documentation

## 2013-12-06 DIAGNOSIS — R490 Dysphonia: Secondary | ICD-10-CM | POA: Insufficient documentation

## 2013-12-06 DIAGNOSIS — Z79899 Other long term (current) drug therapy: Secondary | ICD-10-CM | POA: Insufficient documentation

## 2013-12-06 NOTE — Telephone Encounter (Signed)
Per staff message and POF I have scheduled appts. Advised scheduler to move labs  [place]

## 2013-12-06 NOTE — Progress Notes (Signed)
pateint was sent to ct sim 1st  And then to nursing to check meds and vitals

## 2013-12-06 NOTE — Telephone Encounter (Signed)
s.w. pt and advised on new d.t. for 2.16.15 appt...pt ok and aware

## 2013-12-07 NOTE — Patient Instructions (Signed)
We discussed her treatment options including a course of concurrent chemoradiation with weekly carboplatin and paclitaxel.

## 2013-12-10 ENCOUNTER — Ambulatory Visit
Admission: RE | Admit: 2013-12-10 | Discharge: 2013-12-10 | Disposition: A | Discharge status: Home / Self Care | Payer: Self-pay | Source: Ambulatory Visit | Attending: Obstetrics & Gynecology | Admitting: Obstetrics & Gynecology

## 2013-12-10 ENCOUNTER — Other Ambulatory Visit: Payer: Medicare Other

## 2013-12-10 ENCOUNTER — Encounter: Payer: Self-pay | Admitting: *Deleted

## 2013-12-10 DIAGNOSIS — N644 Mastodynia: Secondary | ICD-10-CM

## 2013-12-10 DIAGNOSIS — C3410 Malignant neoplasm of upper lobe, unspecified bronchus or lung: Secondary | ICD-10-CM | POA: Insufficient documentation

## 2013-12-10 NOTE — Progress Notes (Signed)
Radiation Oncology         [phone] ________________________________  Name: Megan [name] MRN: [id number]  Date: 12/05/2013  DOB: Sep 05, 1941  HU:[id number],TKPTWS DENNIS, MD  Brand Males, MD   Curt Bears, M.D.  REFERRING PHYSICIAN: Brand Males, MD   DIAGNOSIS: The encounter diagnosis was Malignant neoplasm of upper lobe, bronchus or lung.   HISTORY OF PRESENT ILLNESS::Megan [name] is a 73 y.o. female who is seen for an initial consultation visit. The patient indicates that she began experiencing a cough in October of 2014. This was associated with some shortness of breath and the patient was seen by her primary care physician where she mentioned these complaints. A chest x-ray was performed which showed an abnormality within the right lung and further workup was obtained. This included a CT scan of the chest with contrast which was completed on 10/25/2013. This showed dense consolidation within the right upper lobe corresponding to the abnormality seen on chest x-ray. Consolidated lung abutted the mediastinum. No calcifications were seen within the lung. The dimensions measured 6.5 x 5.5 x 5 cm. Subcarinal lymphadenopathy was also present measuring 2.1 x 2.5 cm. A enlarged right hilar lymph node was also present.  The patient was seen by pulmonary and proceeded to undergo a PET scan. This showed hypermetabolic activity within the right lung consistent with primary bronchogenic carcinoma. Contralateral hypermetabolic supraclavicular lymphadenopathy was also present within the left supraclavicular region. No evidence of distant metastasis was seen. Focal metabolic activity was seen within the distal esophagus which was felt to represent physiologic activity.  The patient proceeded to undergo an ultrasound-guided core biopsy of the left supraclavicular lymph node. This returned positive for squamous cell carcinoma. The patient therefore has been diagnosed with locally  advanced non-small cell lung cancer.  The patient is seen today in multidisciplinary thoracic clinic for consideration of radiation treatment as part of her overall treatment plan. She is also being seen by medical oncology today as well with Dr. Julien Nordmann.   PREVIOUS RADIATION THERAPY: No   PAST MEDICAL HISTORY:  has a past medical history of Hypertension; Diabetes mellitus; Rheumatoid arthritis; and GERD (gastroesophageal reflux disease).     PAST SURGICAL HISTORY: Past Surgical History  Procedure Laterality Date  . Cholecystectomy    . Abdominal hysterectomy       FAMILY HISTORY: family history includes Heart attack in her other; Hypertension in her other; Rheum arthritis in an other family member; Stroke in her other.   SOCIAL HISTORY:  reports that she quit smoking about 16 years ago. Her smoking use included Cigarettes. She has a 135 pack-year smoking history. She has never used smokeless tobacco. She reports that she drinks alcohol. She reports that she does not use illicit drugs.   ALLERGIES: Remicade   MEDICATIONS:  Current Outpatient Prescriptions  Medication Sig Dispense Refill  . BIOTIN PO Take 1 tablet by mouth daily.      . calcipotriene-betamethasone (TACLONEX) ointment Apply 1 application topically daily as needed (rash).       . Cholecalciferol (VITAMIN D PO) Take 1 tablet by mouth daily.      . clotrimazole (LOTRIMIN) 1 % cream Apply 1 application topically daily.       . Coenzyme Q10 (CO Q 10 PO) Take 1 capsule by mouth daily.      . Cyanocobalamin (B-12 PO) Take 1 tablet by mouth daily. 200      . fluticasone (CUTIVATE) 0.05 % cream Apply topically 2 (two) times daily.      [name]  losartan-hydrochlorothiazide (HYZAAR) 100-25 MG per tablet Take 1 tablet by mouth daily.      . metFORMIN (GLUCOPHAGE) 500 MG tablet Take 500 mg by mouth 2 (two) times daily with a meal.      . Nutritional Supplements (ESTROVEN PO) Take 1 tablet by mouth daily.      . prochlorperazine  (COMPAZINE) 10 MG tablet Take 1 tablet (10 mg total) by mouth every 6 (six) hours as needed for nausea or vomiting.  60 tablet  0   No current facility-administered medications for this encounter.     REVIEW OF SYSTEMS:  A 15 point review of systems is documented in the electronic medical record. This was obtained by the nursing staff. However, I reviewed this with the patient to discuss relevant findings and make appropriate changes.  Pertinent items are noted in HPI.    PHYSICAL EXAM:  vitals were not taken for this visit.  ECOG = 1  0 - Asymptomatic (Fully active, able to carry on all predisease activities without restriction)  1 - Symptomatic but completely ambulatory (Restricted in physically strenuous activity but ambulatory and able to carry out work of a light or sedentary nature. For example, light housework, office work)  2 - Symptomatic, <50% in bed during the day (Ambulatory and capable of all self care but unable to carry out any work activities. Up and about more than 50% of waking hours)  3 - Symptomatic, >50% in bed, but not bedbound (Capable of only limited self-care, confined to bed or chair 50% or more of waking hours)  4 - Bedbound (Completely disabled. Cannot carry on any self-care. Totally confined to bed or chair)  5 - Death   Eustace Pen MM, Creech RH, Tormey DC, et al. [phone]). "Toxicity and response criteria of the [facility]". [place] Oncol. 5 (6): 649-55  General: Well-developed, in no acute distress HEENT: Normocephalic, atraumatic; oral cavity clear Neck: Supple without any lymphadenopathy Cardiovascular: Regular rate and rhythm Respiratory: Decreased breath sounds within the right upper lung, clear to auscultation on the left GI: Soft, nontender, normal bowel sounds Extremities: No edema present Neuro: No focal deficits     LABORATORY DATA:  Lab Results  Component Value Date   WBC 9.0 11/21/2013   HGB 14.6 11/21/2013   HCT  42.3 11/21/2013   MCV 93.6 11/21/2013   PLT 204 11/21/2013   No results found for this basename: NA, K, CL, CO2   No results found for this basename: ALT, AST, GGT, ALKPHOS, BILITOT      RADIOGRAPHY: Nm Pet Image Initial (pi) Skull Base To Thigh  11/14/2013   CLINICAL DATA:  Initial treatment strategy for right lung mass with lymph nodes.  EXAM: NUCLEAR MEDICINE PET SKULL BASE TO THIGH  FASTING BLOOD GLUCOSE:  Value: 146mg /dl  TECHNIQUE: 17.1 mCi F-18 FDG was injected intravenously. CT data was obtained and used for attenuation correction and anatomic localization only. (This was not acquired as a diagnostic CT examination.) Additional exam technical data entered on technologist worksheet.  COMPARISON:  CT CHEST W/CM dated 10/25/2013  FINDINGS: NECK  There are hypermetabolic left supraclavicular lymph nodes with adjacent 15 mm and 12 mm short axis lymph nodes (image 51 and 50) with SUV max 11.0. No additional hypermetabolic lymph nodes in the neck. There is nodular enlargement of the left lobe of thyroid gland to 21 mm. No metabolic activity is most consistent with a benign thyroid nodule.  CHEST  There is a hypermetabolic right  middle lobe mass measuring 6.8 x 7.4 cm with SUV max 10.8. The hypermetabolic activity is a peripheral within the mass suggesting some central necrosis. No additional hypermetabolic nodules in the left lung or right lower lobe.  There is a hypermetabolic subcarinal lymph node between the esophagus and bronchus intermedius measuring 17 mm short axis (image 85) with SUV max 9.0.  There is focal metabolic activity within the lumen of the distal esophagus (image 103) without mass lesion.  ABDOMEN/PELVIS  No abnormal hypermetabolic activity within the liver, pancreas, adrenal glands, or spleen. No hypermetabolic lymph nodes in the abdomen or pelvis.  SKELETON  No focal hypermetabolic activity to suggest skeletal metastasis.  IMPRESSION: 1. Hypermetabolic right middle lobe mass consists  with primary bronchogenic carcinoma. 2. Contralateral hypermetabolic supraclavicular lymph nodes consistent with nodal metastasis. 3. Hypermetabolic subcarinal nodal metastasis. 4. No evidence of or distant metastasis. Focal metabolic activity in distal esophagus is likely physiologic. 5. Staging by FDG PET imaging TT3 NN3 M0   Electronically Signed   By: Suzy Bouchard M.D.   On: 11/14/2013 10:50   US Biopsy  11/21/2013   CLINICAL DATA:  Right middle lobe lung mass and hypermetabolic lymphadenopathy in the left supraclavicular region.  EXAM: ULTRASOUND GUIDED CORE BIOPSY OF LEFT SUPRACLAVICULAR LYMPH NODE  MEDICATIONS: 2.0 mg IV Versed; 75 mcg IV Fentanyl  Total Moderate Sedation Time: 20 min  PROCEDURE: The procedure, risks, benefits, and alternatives were explained to the patient. Questions regarding the procedure were encouraged and answered. The patient understands and consents to the procedure.  The left neck was prepped with Betadine in a sterile fashion, and a sterile drape was applied covering the operative field. A sterile gown and sterile gloves were used for the procedure. Local anesthesia was provided with 1% Lidocaine.  Ultrasound was used to localize left supraclavicular lymphadenopathy. Under ultrasound guidance, an 18 gauge core biopsy device was advanced. A total of 5 core biopsy samples were obtained and submitted in formalin.  COMPLICATIONS: None.  FINDINGS: There are two adjacent enlarged left supraclavicular lymph nodes which correspond to recent PET findings. Both nodes were sampled. There were no complications.  IMPRESSION: Ultrasound-guided core biopsy performed of left supraclavicular lymphadenopathy.   Electronically Signed   By: Aletta Edouard M.D.   On: 11/21/2013 16:06       IMPRESSION: The patient has a new diagnosis of non-small cell lung cancer, T3, N3, M0, stage IIIB. Her case was discussed in multidisciplinary thoracic conference this morning and she is felt to be an  appropriate candidate for definitive chemoradiation treatment.  I discussed with the patient a potential approximate 6-1/2 week course of radiation treatment with concurrent chemotherapy. We discussed the rationale of this treatment with the expected benefits of radiation treatment. We also discussed the possible side effects and risks of treatment as well. All of her questions were answered.    PLAN: The patient will proceed with a simulation as soon as possible such that we can begin treatment planning. I plan for the patient to begin radiation treatment on 12/16/2013. The patient will also undergo an MRI scan of the brain to complete her staging.    I spent 60 minutes face to face with the patient and more than 50% of that time was spent in counseling and/or coordination of care.    ________________________________   Jodelle Gross, MD, PhD

## 2013-12-13 ENCOUNTER — Ambulatory Visit: Payer: Medicare Other | Admitting: Radiation Oncology

## 2013-12-16 ENCOUNTER — Other Ambulatory Visit: Payer: Self-pay | Admitting: Internal Medicine

## 2013-12-16 ENCOUNTER — Ambulatory Visit (HOSPITAL_BASED_OUTPATIENT_CLINIC_OR_DEPARTMENT_OTHER): Payer: Medicare Other

## 2013-12-16 ENCOUNTER — Other Ambulatory Visit: Payer: Medicare Other

## 2013-12-16 ENCOUNTER — Other Ambulatory Visit (HOSPITAL_BASED_OUTPATIENT_CLINIC_OR_DEPARTMENT_OTHER): Payer: Medicare Other

## 2013-12-16 ENCOUNTER — Ambulatory Visit
Admission: RE | Admit: 2013-12-16 | Discharge: 2013-12-16 | Disposition: A | Discharge status: Home / Self Care | Payer: Medicare Other | Source: Ambulatory Visit | Attending: Radiation Oncology | Admitting: Radiation Oncology

## 2013-12-16 VITALS — BP 118/70 | HR 70 | Temp 96.7°F | Resp 18

## 2013-12-16 DIAGNOSIS — C342 Malignant neoplasm of middle lobe, bronchus or lung: Secondary | ICD-10-CM

## 2013-12-16 DIAGNOSIS — C770 Secondary and unspecified malignant neoplasm of lymph nodes of head, face and neck: Secondary | ICD-10-CM

## 2013-12-16 DIAGNOSIS — Z5111 Encounter for antineoplastic chemotherapy: Secondary | ICD-10-CM

## 2013-12-16 DIAGNOSIS — C3490 Malignant neoplasm of unspecified part of unspecified bronchus or lung: Secondary | ICD-10-CM

## 2013-12-16 LAB — COMPREHENSIVE METABOLIC PANEL (CC13)
ALBUMIN: 3.7 g/dL (ref 3.5–5.0)
ALK PHOS: 108 U/L (ref 40–150)
ALT: 47 U/L (ref 0–55)
AST: 33 U/L (ref 5–34)
Anion Gap: 12 mEq/L — ABNORMAL HIGH (ref 3–11)
BUN: 14.1 mg/dL (ref 7.0–26.0)
CALCIUM: 10.2 mg/dL (ref 8.4–10.4)
CHLORIDE: 103 meq/L (ref 98–109)
CO2: 28 mEq/L (ref 22–29)
Creatinine: 0.8 mg/dL (ref 0.6–1.1)
Glucose: 132 mg/dl (ref 70–140)
POTASSIUM: 3.1 meq/L — AB (ref 3.5–5.1)
Sodium: 142 mEq/L (ref 136–145)
Total Bilirubin: 0.44 mg/dL (ref 0.20–1.20)
Total Protein: 7.2 g/dL (ref 6.4–8.3)

## 2013-12-16 LAB — CBC WITH DIFFERENTIAL/PLATELET
BASO%: 0.7 % (ref 0.0–2.0)
Basophils Absolute: 0.1 10*3/uL (ref 0.0–0.1)
EOS%: 3.1 % (ref 0.0–7.0)
Eosinophils Absolute: 0.3 10*3/uL (ref 0.0–0.5)
HCT: 42.9 % (ref 34.8–46.6)
HGB: 14.8 g/dL (ref 11.6–15.9)
LYMPH#: 3.1 10*3/uL (ref 0.9–3.3)
LYMPH%: 32 % (ref 14.0–49.7)
MCH: 31.8 pg (ref 25.1–34.0)
MCHC: 34.5 g/dL (ref 31.5–36.0)
MCV: 92.3 fL (ref 79.5–101.0)
MONO#: 0.8 10*3/uL (ref 0.1–0.9)
MONO%: 8.4 % (ref 0.0–14.0)
NEUT#: 5.5 10*3/uL (ref 1.5–6.5)
NEUT%: 55.8 % (ref 38.4–76.8)
Platelets: 206 10*3/uL (ref 145–400)
RBC: 4.65 10*6/uL (ref 3.70–5.45)
RDW: 14.8 % — ABNORMAL HIGH (ref 11.2–14.5)
WBC: 9.8 10*3/uL (ref 3.9–10.3)
nRBC: 0 % (ref 0–0)

## 2013-12-16 LAB — TECHNOLOGIST REVIEW

## 2013-12-16 MED ORDER — SODIUM CHLORIDE 0.9 % IV SOLN
180.6000 mg | Freq: Once | INTRAVENOUS | Status: AC
  Administered 2013-12-16: 180 mg via INTRAVENOUS
  Filled 2013-12-16: qty 18

## 2013-12-16 MED ORDER — ONDANSETRON 16 MG/50ML IVPB (CHCC)
16.0000 mg | Freq: Once | INTRAVENOUS | Status: AC
  Administered 2013-12-16: 16 mg via INTRAVENOUS

## 2013-12-16 MED ORDER — DEXAMETHASONE SODIUM PHOSPHATE 20 MG/5ML IJ SOLN
INTRAMUSCULAR | Status: AC
  Filled 2013-12-16: qty 5

## 2013-12-16 MED ORDER — PACLITAXEL CHEMO INJECTION 300 MG/50ML: 45.0000 mg/m2 | Freq: Once | INTRAVENOUS | Status: DC

## 2013-12-16 MED ORDER — DEXAMETHASONE SODIUM PHOSPHATE 20 MG/5ML IJ SOLN
20.0000 mg | Freq: Once | INTRAMUSCULAR | Status: AC
  Administered 2013-12-16: 20 mg via INTRAVENOUS

## 2013-12-16 MED ORDER — FAMOTIDINE IN NACL 20-0.9 MG/50ML-% IV SOLN
20.0000 mg | Freq: Once | INTRAVENOUS | Status: AC
  Administered 2013-12-16: 20 mg via INTRAVENOUS

## 2013-12-16 MED ORDER — ONDANSETRON 16 MG/50ML IVPB (CHCC)
INTRAVENOUS | Status: AC
  Filled 2013-12-16: qty 16

## 2013-12-16 MED ORDER — SODIUM CHLORIDE 0.9 % IV SOLN
Freq: Once | INTRAVENOUS | Status: AC
  Administered 2013-12-16: 13:00:00 via INTRAVENOUS

## 2013-12-16 MED ORDER — FAMOTIDINE IN NACL 20-0.9 MG/50ML-% IV SOLN
INTRAVENOUS | Status: AC
  Filled 2013-12-16: qty 50

## 2013-12-16 MED ORDER — DIPHENHYDRAMINE HCL 50 MG/ML IJ SOLN
INTRAMUSCULAR | Status: AC
  Filled 2013-12-16: qty 1

## 2013-12-16 MED ORDER — PACLITAXEL CHEMO INJECTION 300 MG/50ML
45.0000 mg/m2 | Freq: Once | INTRAVENOUS | Status: AC
  Administered 2013-12-16: 90 mg via INTRAVENOUS
  Filled 2013-12-16: qty 15

## 2013-12-16 MED ORDER — DIPHENHYDRAMINE HCL 50 MG/ML IJ SOLN
50.0000 mg | Freq: Once | INTRAMUSCULAR | Status: AC
  Administered 2013-12-16: 50 mg via INTRAVENOUS

## 2013-12-16 NOTE — Patient Instructions (Addendum)
[facility] Discharge Instructions for Patients Receiving Chemotherapy  Today you received the following chemotherapy agents: Taxol, Carboplatin   To help prevent nausea and vomiting after your treatment, we encourage you to take your nausea medication as prescribed.   If you develop nausea and vomiting that is not controlled by your nausea medication, call the clinic.   BELOW ARE SYMPTOMS THAT SHOULD BE REPORTED IMMEDIATELY:  *FEVER GREATER THAN 100.5 F  *CHILLS WITH OR WITHOUT FEVER  NAUSEA AND VOMITING THAT IS NOT CONTROLLED WITH YOUR NAUSEA MEDICATION  *UNUSUAL SHORTNESS OF BREATH  *UNUSUAL BRUISING OR BLEEDING  TENDERNESS IN MOUTH AND THROAT WITH OR WITHOUT PRESENCE OF ULCERS  *URINARY PROBLEMS  *BOWEL PROBLEMS  UNUSUAL RASH Items with * indicate a potential emergency and should be followed up as soon as possible.  Feel free to call the clinic you have any questions or concerns. The clinic phone number is [phone].   Carboplatin injection What is this medicine? CARBOPLATIN (KAR boe pla tin) is a chemotherapy drug. It targets fast dividing cells, like cancer cells, and causes these cells to die. This medicine is used to treat ovarian cancer and many other cancers. This medicine may be used for other purposes; ask your health care provider or pharmacist if you have questions. COMMON BRAND NAME(S): Paraplatin What should I tell my health care provider before I take this medicine? They need to know if you have any of these conditions: -blood disorders -hearing problems -kidney disease -recent or ongoing radiation therapy -an unusual or allergic reaction to carboplatin, cisplatin, other chemotherapy, other medicines, foods, dyes, or preservatives -pregnant or trying to get pregnant -breast-feeding How should I use this medicine? This drug is usually given as an infusion into a vein. It is administered in a hospital or clinic by a specially trained  health care professional. Talk to your pediatrician regarding the use of this medicine in children. Special care may be needed. Overdosage: If you think you have taken too much of this medicine contact a poison control center or emergency room at once. NOTE: This medicine is only for you. Do not share this medicine with others. What if I miss a dose? It is important not to miss a dose. Call your doctor or health care professional if you are unable to keep an appointment. What may interact with this medicine? -medicines for seizures -medicines to increase blood counts like filgrastim, pegfilgrastim, sargramostim -some antibiotics like amikacin, gentamicin, neomycin, streptomycin, tobramycin -vaccines Talk to your doctor or health care professional before taking any of these medicines: -acetaminophen -aspirin -ibuprofen -ketoprofen -naproxen This list may not describe all possible interactions. Give your health care provider a list of all the medicines, herbs, non-prescription drugs, or dietary supplements you use. Also tell them if you smoke, drink alcohol, or use illegal drugs. Some items may interact with your medicine. What should I watch for while using this medicine? Your condition will be monitored carefully while you are receiving this medicine. You will need important blood work done while you are taking this medicine. This drug may make you feel generally unwell. This is not uncommon, as chemotherapy can affect healthy cells as well as cancer cells. Report any side effects. Continue your course of treatment even though you feel ill unless your doctor tells you to stop. In some cases, you may be given additional medicines to help with side effects. Follow all directions for their use. Call your doctor or health care professional for advice if  you get a fever, chills or sore throat, or other symptoms of a cold or flu. Do not treat yourself. This drug decreases your body's ability to fight  infections. Try to avoid being around people who are sick. This medicine may increase your risk to bruise or bleed. Call your doctor or health care professional if you notice any unusual bleeding. Be careful brushing and flossing your teeth or using a toothpick because you may get an infection or bleed more easily. If you have any dental work done, tell your dentist you are receiving this medicine. Avoid taking products that contain aspirin, acetaminophen, ibuprofen, naproxen, or ketoprofen unless instructed by your doctor. These medicines may hide a fever. Do not become pregnant while taking this medicine. Women should inform their doctor if they wish to become pregnant or think they might be pregnant. There is a potential for serious side effects to an unborn child. Talk to your health care professional or pharmacist for more information. Do not breast-feed an infant while taking this medicine. What side effects may I notice from receiving this medicine? Side effects that you should report to your doctor or health care professional as soon as possible: -allergic reactions like skin rash, itching or hives, swelling of the face, lips, or tongue -signs of infection - fever or chills, cough, sore throat, pain or difficulty passing urine -signs of decreased platelets or bleeding - bruising, pinpoint red spots on the skin, black, tarry stools, nosebleeds -signs of decreased red blood cells - unusually weak or tired, fainting spells, lightheadedness -breathing problems -changes in hearing -changes in vision -chest pain -high blood pressure -low blood counts - This drug may decrease the number of white blood cells, red blood cells and platelets. You may be at increased risk for infections and bleeding. -nausea and vomiting -pain, swelling, redness or irritation at the injection site -pain, tingling, numbness in the hands or feet -problems with balance, talking, walking -trouble passing urine or change  in the amount of urine Side effects that usually do not require medical attention (report to your doctor or health care professional if they continue or are bothersome): -hair loss -loss of appetite -metallic taste in the mouth or changes in taste This list may not describe all possible side effects. Call your doctor for medical advice about side effects. You may report side effects to FDA at 1-800-FDA-1088. Where should I keep my medicine? This drug is given in a hospital or clinic and will not be stored at home. NOTE: This sheet is a summary. It may not cover all possible information. If you have questions about this medicine, talk to your doctor, pharmacist, or health care provider.  2014, Elsevier/Gold Standard. (2008-01-22 14:38:05)  Paclitaxel injection -Taxol What is this medicine? PACLITAXEL (PAK li TAX el) is a chemotherapy drug. It targets fast dividing cells, like cancer cells, and causes these cells to die. This medicine is used to treat ovarian cancer, breast cancer, and other cancers. This medicine may be used for other purposes; ask your health care provider or pharmacist if you have questions. COMMON BRAND NAME(S): Onxol , Taxol What should I tell my health care provider before I take this medicine? They need to know if you have any of these conditions: -blood disorders -irregular heartbeat -infection (especially a virus infection such as chickenpox, cold sores, or herpes) -liver disease -previous or ongoing radiation therapy -an unusual or allergic reaction to paclitaxel, alcohol, polyoxyethylated castor oil, other chemotherapy agents, other medicines, foods, dyes, or  preservatives -pregnant or trying to get pregnant -breast-feeding How should I use this medicine? This drug is given as an infusion into a vein. It is administered in a hospital or clinic by a specially trained health care professional. Talk to your pediatrician regarding the use of this medicine in  children. Special care may be needed. Overdosage: If you think you have taken too much of this medicine contact a poison control center or emergency room at once. NOTE: This medicine is only for you. Do not share this medicine with others. What if I miss a dose? It is important not to miss your dose. Call your doctor or health care professional if you are unable to keep an appointment. What may interact with this medicine? Do not take this medicine with any of the following medications: -disulfiram -metronidazole This medicine may also interact with the following medications: -cyclosporine -diazepam -ketoconazole -medicines to increase blood counts like filgrastim, pegfilgrastim, sargramostim -other chemotherapy drugs like cisplatin, doxorubicin, epirubicin, etoposide, teniposide, vincristine -quinidine -testosterone -vaccines -verapamil Talk to your doctor or health care professional before taking any of these medicines: -acetaminophen -aspirin -ibuprofen -ketoprofen -naproxen This list may not describe all possible interactions. Give your health care provider a list of all the medicines, herbs, non-prescription drugs, or dietary supplements you use. Also tell them if you smoke, drink alcohol, or use illegal drugs. Some items may interact with your medicine. What should I watch for while using this medicine? Your condition will be monitored carefully while you are receiving this medicine. You will need important blood work done while you are taking this medicine. This drug may make you feel generally unwell. This is not uncommon, as chemotherapy can affect healthy cells as well as cancer cells. Report any side effects. Continue your course of treatment even though you feel ill unless your doctor tells you to stop. In some cases, you may be given additional medicines to help with side effects. Follow all directions for their use. Call your doctor or health care professional for advice  if you get a fever, chills or sore throat, or other symptoms of a cold or flu. Do not treat yourself. This drug decreases your body's ability to fight infections. Try to avoid being around people who are sick. This medicine may increase your risk to bruise or bleed. Call your doctor or health care professional if you notice any unusual bleeding. Be careful brushing and flossing your teeth or using a toothpick because you may get an infection or bleed more easily. If you have any dental work done, tell your dentist you are receiving this medicine. Avoid taking products that contain aspirin, acetaminophen, ibuprofen, naproxen, or ketoprofen unless instructed by your doctor. These medicines may hide a fever. Do not become pregnant while taking this medicine. Women should inform their doctor if they wish to become pregnant or think they might be pregnant. There is a potential for serious side effects to an unborn child. Talk to your health care professional or pharmacist for more information. Do not breast-feed an infant while taking this medicine. Men are advised not to father a child while receiving this medicine. What side effects may I notice from receiving this medicine? Side effects that you should report to your doctor or health care professional as soon as possible: -allergic reactions like skin rash, itching or hives, swelling of the face, lips, or tongue -low blood counts - This drug may decrease the number of white blood cells, red blood cells and platelets. You  may be at increased risk for infections and bleeding. -signs of infection - fever or chills, cough, sore throat, pain or difficulty passing urine -signs of decreased platelets or bleeding - bruising, pinpoint red spots on the skin, black, tarry stools, nosebleeds -signs of decreased red blood cells - unusually weak or tired, fainting spells, lightheadedness -breathing problems -chest pain -high or low blood pressure -mouth  sores -nausea and vomiting -pain, swelling, redness or irritation at the injection site -pain, tingling, numbness in the hands or feet -slow or irregular heartbeat -swelling of the ankle, feet, hands Side effects that usually do not require medical attention (report to your doctor or health care professional if they continue or are bothersome): -bone pain -complete hair loss including hair on your head, underarms, pubic hair, eyebrows, and eyelashes -changes in the color of fingernails -diarrhea -loosening of the fingernails -loss of appetite -muscle or joint pain -red flush to skin -sweating This list may not describe all possible side effects. Call your doctor for medical advice about side effects. You may report side effects to FDA at 1-800-FDA-1088. Where should I keep my medicine? This drug is given in a hospital or clinic and will not be stored at home. NOTE: This sheet is a summary. It may not cover all possible information. If you have questions about this medicine, talk to your doctor, pharmacist, or health care provider.  2014, Elsevier/Gold Standard. (2012-12-10 16:41:21)

## 2013-12-16 NOTE — Progress Notes (Signed)
Quick Note:  Call patient with the result and order K Dur 20 meq po qd X 7 days ______

## 2013-12-17 ENCOUNTER — Telehealth: Payer: Self-pay | Admitting: *Deleted

## 2013-12-17 ENCOUNTER — Ambulatory Visit
Admission: RE | Admit: 2013-12-17 | Discharge: 2013-12-17 | Disposition: A | Discharge status: Home / Self Care | Payer: Medicare Other | Source: Ambulatory Visit | Attending: Radiation Oncology | Admitting: Radiation Oncology

## 2013-12-17 DIAGNOSIS — E876 Hypokalemia: Secondary | ICD-10-CM

## 2013-12-17 MED ORDER — POTASSIUM CHLORIDE CRYS ER 20 MEQ PO TBCR: 20.0000 meq | EXTENDED_RELEASE_TABLET | Freq: Every day | ORAL | Status: DC

## 2013-12-17 NOTE — Telephone Encounter (Signed)
Message copied by Cherylynn Ridges on Tue Dec 17, 2013  4:58 PM ------      Message from: Christa See      Created: Mon Dec 16, 2013  4:11 PM      Regarding: 1st time chemo f/u call      Contact: [phone]       1st time taxol and carboplatin. MD is Mohamed. Please call and see how patient is. Thanks! Kristen  ------

## 2013-12-17 NOTE — Telephone Encounter (Signed)
Called patient with lab results and sent prescription for K Dur 20 mEq to pharmacy.  Per Awilda Metro, PA.  Patient verbalized understanding.

## 2013-12-17 NOTE — Telephone Encounter (Signed)
[place] for chemotherapy F/U.  Patient is doing well.  Denies n/v.  Denies any new side effects or symptoms.  Bowel and bladder is functioning well.  Eating and drinking well and I instructed to drink 64 oz minimum daily or at least the day before, of and after treatment.  Reports she took a compazine "to stay ahead of the game last night".  No nausea so far.  "I feel great!"  Denies questions at this time and encouraged to call if needed.  Reviewed how to call after hours in the case of an emergency.

## 2013-12-17 NOTE — Telephone Encounter (Signed)
Message copied by Norma Fredrickson on Tue Dec 17, 2013  9:13 AM ------      Message from: Britt Bottom      Created: Tue Dec 17, 2013  8:18 AM                   ----- Message -----         From: Curt Bears, MD         Sent: 12/16/2013   7:58 PM           To: Carlton Adam, PA-C, #            Call patient with the result and order K Dur 20 meq po qd X 7 days ------

## 2013-12-18 ENCOUNTER — Ambulatory Visit: Payer: Medicare Other

## 2013-12-19 ENCOUNTER — Ambulatory Visit
Admission: RE | Admit: 2013-12-19 | Discharge: 2013-12-19 | Disposition: A | Discharge status: Home / Self Care | Payer: Medicare Other | Source: Ambulatory Visit | Attending: Radiation Oncology | Admitting: Radiation Oncology

## 2013-12-20 ENCOUNTER — Ambulatory Visit
Admission: RE | Admit: 2013-12-20 | Discharge: 2013-12-20 | Disposition: A | Discharge status: Home / Self Care | Payer: Medicare Other | Source: Ambulatory Visit | Attending: Radiation Oncology | Admitting: Radiation Oncology

## 2013-12-20 ENCOUNTER — Telehealth: Payer: Self-pay | Admitting: *Deleted

## 2013-12-20 DIAGNOSIS — C3410 Malignant neoplasm of upper lobe, unspecified bronchus or lung: Secondary | ICD-10-CM

## 2013-12-20 MED ORDER — BIAFINE EX EMUL
CUTANEOUS | Status: DC | PRN
  Administered 2013-12-20: 16:00:00 via TOPICAL

## 2013-12-20 NOTE — Telephone Encounter (Signed)
Pt called stating that she has had a tough time with her finger after having it pricked for her CBC this past week.  She states that it has been bruised with a bump and feels it got infected.  She states "it has been a real ordeal for me".  Advised that she get stuck in the arm and to inform the phlebotomist that she does not want a finger stick anymore.  She also stated that her CBC will not be accurate because she has inactive hemachromatosis.  Asked her if she had informed Dr Vista Mink of this, she states no.  Advised that she inform Dr Vista Mink of this at her next f/u visit.  She verbalized understanding.  SLJ

## 2013-12-20 NOTE — Progress Notes (Signed)
Weekly rad tx, pt education done, biafine  gioven with rad book, no pain, no diff swallowing, , at present, discussed skin irritation,fatigue,throat changes, pain 3:58 PM

## 2013-12-20 NOTE — Progress Notes (Signed)
   Department of Radiation Oncology  Phone:  [phone] Fax:        [phone]  Weekly Treatment Note    Name: Megan [name] Date: 12/20/2013 MRN: [id number] DOB: 03-17-1941   Current dose: 8 Gy  Current fraction: 4   MEDICATIONS: Current Outpatient Prescriptions  Medication Sig Dispense Refill  . BIOTIN PO Take 1 tablet by mouth daily.      . calcipotriene-betamethasone (TACLONEX) ointment Apply 1 application topically daily as needed (rash).       . Cholecalciferol (VITAMIN D PO) Take 1 tablet by mouth daily.      . clotrimazole (LOTRIMIN) 1 % cream Apply 1 application topically daily.       . Coenzyme Q10 (CO Q 10 PO) Take 1 capsule by mouth daily.      . Cyanocobalamin (B-12 PO) Take 1 tablet by mouth daily. 200      . losartan-hydrochlorothiazide (HYZAAR) 100-25 MG per tablet Take 1 tablet by mouth daily.      . potassium chloride SA (K-DUR,KLOR-CON) 20 MEQ tablet Take 1 tablet (20 mEq total) by mouth daily.  7 tablet  0  . prochlorperazine (COMPAZINE) 10 MG tablet Take 1 tablet (10 mg total) by mouth every 6 (six) hours as needed for nausea or vomiting.  60 tablet  0  . emollient (BIAFINE) cream Apply 1 application topically 2 (two) times daily. Apply to skin chest area bid after rad tx and bedtime daily      . fluticasone (CUTIVATE) 0.05 % cream Apply topically 2 (two) times daily.      . metFORMIN (GLUCOPHAGE) 500 MG tablet Take 500 mg by mouth 2 (two) times daily with a meal.      . Nutritional Supplements (ESTROVEN PO) Take 1 tablet by mouth daily.       Current Facility-Administered Medications  Medication Dose Route Frequency Provider Last Rate Last Dose  . topical emolient (BIAFINE) emulsion   Topical PRN Marye Round, MD         ALLERGIES: Remicade   LABORATORY DATA:  Lab Results  Component Value Date   WBC 9.8 12/16/2013   HGB 14.8 12/16/2013   HCT 42.9 12/16/2013   MCV 92.3 12/16/2013   PLT 206 12/16/2013   Lab Results  Component Value  Date   NA 142 12/16/2013   K 3.1* 12/16/2013   CO2 28 12/16/2013   Lab Results  Component Value Date   ALT 47 12/16/2013   AST 33 12/16/2013   ALKPHOS 108 12/16/2013   BILITOT 0.44 12/16/2013     NARRATIVE: Megan [name] was seen today for weekly treatment management. The chart was checked and the patient's films were reviewed. The patient states she is doing well in her first week of treatment. No change in breathing. No esophagitis. No difficulty so far.  PHYSICAL EXAMINATION: Alert, in no acute distress. Weight 176.8 pounds     ASSESSMENT: The patient is doing satisfactorily with treatment.  PLAN: We will continue with the patient's radiation treatment as planned.

## 2013-12-23 ENCOUNTER — Ambulatory Visit
Admission: RE | Admit: 2013-12-23 | Discharge: 2013-12-23 | Disposition: A | Discharge status: Home / Self Care | Payer: Medicare Other | Source: Ambulatory Visit | Attending: Radiation Oncology | Admitting: Radiation Oncology

## 2013-12-23 ENCOUNTER — Ambulatory Visit (HOSPITAL_BASED_OUTPATIENT_CLINIC_OR_DEPARTMENT_OTHER): Payer: Medicare Other

## 2013-12-23 ENCOUNTER — Encounter: Payer: Self-pay | Admitting: Physician Assistant

## 2013-12-23 ENCOUNTER — Telehealth: Payer: Self-pay | Admitting: Internal Medicine

## 2013-12-23 ENCOUNTER — Other Ambulatory Visit (HOSPITAL_BASED_OUTPATIENT_CLINIC_OR_DEPARTMENT_OTHER): Payer: Medicare Other

## 2013-12-23 ENCOUNTER — Ambulatory Visit (HOSPITAL_BASED_OUTPATIENT_CLINIC_OR_DEPARTMENT_OTHER): Payer: Medicare Other | Admitting: Physician Assistant

## 2013-12-23 VITALS — BP 154/74 | HR 84 | Temp 97.4°F | Resp 18 | Ht 66.0 in | Wt 178.4 lb

## 2013-12-23 DIAGNOSIS — C770 Secondary and unspecified malignant neoplasm of lymph nodes of head, face and neck: Secondary | ICD-10-CM

## 2013-12-23 DIAGNOSIS — C3490 Malignant neoplasm of unspecified part of unspecified bronchus or lung: Secondary | ICD-10-CM

## 2013-12-23 DIAGNOSIS — C342 Malignant neoplasm of middle lobe, bronchus or lung: Secondary | ICD-10-CM

## 2013-12-23 DIAGNOSIS — Z5111 Encounter for antineoplastic chemotherapy: Secondary | ICD-10-CM

## 2013-12-23 LAB — COMPREHENSIVE METABOLIC PANEL (CC13)
ALT: 39 U/L (ref 0–55)
ANION GAP: 9 meq/L (ref 3–11)
AST: 26 U/L (ref 5–34)
Albumin: 3.3 g/dL — ABNORMAL LOW (ref 3.5–5.0)
Alkaline Phosphatase: 91 U/L (ref 40–150)
BUN: 12.9 mg/dL (ref 7.0–26.0)
CALCIUM: 9.3 mg/dL (ref 8.4–10.4)
CHLORIDE: 104 meq/L (ref 98–109)
CO2: 26 meq/L (ref 22–29)
Creatinine: 0.9 mg/dL (ref 0.6–1.1)
Glucose: 307 mg/dl — ABNORMAL HIGH (ref 70–140)
Potassium: 4 mEq/L (ref 3.5–5.1)
SODIUM: 138 meq/L (ref 136–145)
TOTAL PROTEIN: 6.4 g/dL (ref 6.4–8.3)
Total Bilirubin: 0.54 mg/dL (ref 0.20–1.20)

## 2013-12-23 LAB — CBC WITH DIFFERENTIAL/PLATELET
BASO%: 0.9 % (ref 0.0–2.0)
Basophils Absolute: 0.1 10*3/uL (ref 0.0–0.1)
EOS ABS: 0.2 10*3/uL (ref 0.0–0.5)
EOS%: 3.7 % (ref 0.0–7.0)
HEMATOCRIT: 41.1 % (ref 34.8–46.6)
HGB: 13.9 g/dL (ref 11.6–15.9)
LYMPH%: 21.9 % (ref 14.0–49.7)
MCH: 32.1 pg (ref 25.1–34.0)
MCHC: 33.9 g/dL (ref 31.5–36.0)
MCV: 94.7 fL (ref 79.5–101.0)
MONO#: 0.3 10*3/uL (ref 0.1–0.9)
MONO%: 5.6 % (ref 0.0–14.0)
NEUT#: 4.2 10*3/uL (ref 1.5–6.5)
NEUT%: 67.9 % (ref 38.4–76.8)
Platelets: 212 10*3/uL (ref 145–400)
RBC: 4.34 10*6/uL (ref 3.70–5.45)
RDW: 14.6 % — ABNORMAL HIGH (ref 11.2–14.5)
WBC: 6.2 10*3/uL (ref 3.9–10.3)
lymph#: 1.4 10*3/uL (ref 0.9–3.3)

## 2013-12-23 LAB — TECHNOLOGIST REVIEW

## 2013-12-23 MED ORDER — FAMOTIDINE IN NACL 20-0.9 MG/50ML-% IV SOLN
20.0000 mg | Freq: Once | INTRAVENOUS | Status: AC
  Administered 2013-12-23: 20 mg via INTRAVENOUS

## 2013-12-23 MED ORDER — ONDANSETRON 16 MG/50ML IVPB (CHCC)
16.0000 mg | Freq: Once | INTRAVENOUS | Status: AC
  Administered 2013-12-23: 16 mg via INTRAVENOUS

## 2013-12-23 MED ORDER — DEXAMETHASONE SODIUM PHOSPHATE 20 MG/5ML IJ SOLN
20.0000 mg | Freq: Once | INTRAMUSCULAR | Status: AC
  Administered 2013-12-23: 20 mg via INTRAVENOUS

## 2013-12-23 MED ORDER — ONDANSETRON 16 MG/50ML IVPB (CHCC)
INTRAVENOUS | Status: AC
  Filled 2013-12-23: qty 16

## 2013-12-23 MED ORDER — DIPHENHYDRAMINE HCL 50 MG/ML IJ SOLN
50.0000 mg | Freq: Once | INTRAMUSCULAR | Status: AC
  Administered 2013-12-23: 50 mg via INTRAVENOUS

## 2013-12-23 MED ORDER — SODIUM CHLORIDE 0.9 % IV SOLN
45.0000 mg/m2 | Freq: Once | INTRAVENOUS | Status: AC
  Administered 2013-12-23: 90 mg via INTRAVENOUS
  Filled 2013-12-23: qty 15

## 2013-12-23 MED ORDER — DEXAMETHASONE SODIUM PHOSPHATE 20 MG/5ML IJ SOLN
INTRAMUSCULAR | Status: AC
  Filled 2013-12-23: qty 5

## 2013-12-23 MED ORDER — SODIUM CHLORIDE 0.9 % IV SOLN
180.6000 mg | Freq: Once | INTRAVENOUS | Status: AC
  Administered 2013-12-23: 180 mg via INTRAVENOUS
  Filled 2013-12-23: qty 18

## 2013-12-23 MED ORDER — DIPHENHYDRAMINE HCL 50 MG/ML IJ SOLN
INTRAMUSCULAR | Status: AC
  Filled 2013-12-23: qty 1

## 2013-12-23 MED ORDER — SODIUM CHLORIDE 0.9 % IV SOLN
Freq: Once | INTRAVENOUS | Status: AC
  Administered 2013-12-23: 10:00:00 via INTRAVENOUS

## 2013-12-23 MED ORDER — FAMOTIDINE IN NACL 20-0.9 MG/50ML-% IV SOLN
INTRAVENOUS | Status: AC
  Filled 2013-12-23: qty 50

## 2013-12-23 NOTE — Patient Instructions (Signed)
[facility] Discharge Instructions for Patients Receiving Chemotherapy  Today you received the following chemotherapy agents Taxol and Carboplatin.  To help prevent nausea and vomiting after your treatment, we encourage you to take your nausea medication.   If you develop nausea and vomiting that is not controlled by your nausea medication, call the clinic.   BELOW ARE SYMPTOMS THAT SHOULD BE REPORTED IMMEDIATELY:  *FEVER GREATER THAN 100.5 F  *CHILLS WITH OR WITHOUT FEVER  NAUSEA AND VOMITING THAT IS NOT CONTROLLED WITH YOUR NAUSEA MEDICATION  *UNUSUAL SHORTNESS OF BREATH  *UNUSUAL BRUISING OR BLEEDING  TENDERNESS IN MOUTH AND THROAT WITH OR WITHOUT PRESENCE OF ULCERS  *URINARY PROBLEMS  *BOWEL PROBLEMS  UNUSUAL RASH Items with * indicate a potential emergency and should be followed up as soon as possible.  Feel free to call the clinic you have any questions or concerns. The clinic phone number is [phone].    

## 2013-12-23 NOTE — Progress Notes (Addendum)
No images are attached to the encounter. No scans are attached to the encounter. No scans are attached to the encounter. [place] VISIT PROGRESS NOTE  [name], MD [address] [place] [address]  DIAGNOSIS: Squamous cell lung cancer   Primary site: Lung (Right)   Staging method: AJCC 7th Edition   Clinical: Stage IIIB (T3, N3, M0)   Summary: Stage IIIB (T3, N3, M0)  PRIOR THERAPY: None  CURRENT THERAPY: Concurrent chemoradiation with weekly chemotherapy in the form of carboplatin for AUC of 2 and paclitaxel 45 mg per meter squared. Status post 1 week of treatment  DISEASE STAGE: Squamous cell lung cancer   Primary site: Lung (Right)   Staging method: AJCC 7th Edition   Clinical: Stage IIIB (T3, N3, M0)   Summary: Stage IIIB (T3, N3, M0)  CHEMOTHERAPY INTENT: Control/Palliative  CURRENT # OF CHEMOTHERAPY CYCLES: 2  CURRENT ANTIEMETICS: Zofran, dexamethasone, Compazine  CURRENT SMOKING STATUS: Former smoker, quit 12/05/1997  ORAL CHEMOTHERAPY AND CONSENT: n/a  CURRENT BISPHOSPHONATES USE: None  PAIN MANAGEMENT: no cancer related pain, patient does have RA  NARCOTICS INDUCED CONSTIPATION: none  LIVING WILL AND CODE STATUS: ?   INTERVAL HISTORY: Megan [name] 73 y.o. female returns for a scheduled regular symptom managed visit for followup of her recently diagnosed stage IIIB non-small cell lung cancer, squamous cell carcinoma. She is currently undergoing a course of concurrent chemoradiation and is status post 1 week of treatment. Overall she's tolerating her treatment without difficulty. She wanted be sure that we knew that her great-grandfather died from hemochromatosis and to have this added to her family history. She denied any fever, chills, nausea or vomiting, diarrhea or constipation. She denied any significant weight loss. She does complain of hot flashes and feels as though she is going through menopause  right now.  MEDICAL HISTORY: Past Medical History  Diagnosis Date  . Hypertension   . Diabetes mellitus   . Rheumatoid arthritis   . GERD (gastroesophageal reflux disease)     no meds for    ALLERGIES:  is allergic to remicade.  MEDICATIONS:  Current Outpatient Prescriptions  Medication Sig Dispense Refill  . BIOTIN PO Take 1 tablet by mouth daily.      . calcipotriene-betamethasone (TACLONEX) ointment Apply 1 application topically daily as needed (rash).       . Cholecalciferol (VITAMIN D PO) Take 1 tablet by mouth daily.      . clotrimazole (LOTRIMIN) 1 % cream Apply 1 application topically daily.       . Coenzyme Q10 (CO Q 10 PO) Take 1 capsule by mouth daily.      . Cyanocobalamin (B-12 PO) Take 1 tablet by mouth daily. 200      . emollient (BIAFINE) cream Apply 1 application topically 2 (two) times daily. Apply to skin chest area bid after rad tx and bedtime daily      . losartan-hydrochlorothiazide (HYZAAR) 100-25 MG per tablet Take 1 tablet by mouth daily.      . metFORMIN (GLUCOPHAGE) 500 MG tablet Take 500 mg by mouth 2 (two) times daily with a meal.      . potassium chloride SA (K-DUR,KLOR-CON) 20 MEQ tablet Take 1 tablet (20 mEq total) by mouth daily.  7 tablet  0  . fluticasone (CUTIVATE) 0.05 % cream Apply topically 2 (two) times daily.      . Nutritional Supplements (ESTROVEN PO) Take 1 tablet by mouth daily.      [name]  prochlorperazine (COMPAZINE) 10 MG tablet Take 1 tablet (10 mg total) by mouth every 6 (six) hours as needed for nausea or vomiting.  60 tablet  0   No current facility-administered medications for this visit.    SURGICAL HISTORY:  Past Surgical History  Procedure Laterality Date  . Cholecystectomy    . Abdominal hysterectomy      REVIEW OF SYSTEMS:  Constitutional: positive for Half flashes Eyes: negative Ears, nose, mouth, throat, and face: negative Respiratory: negative Cardiovascular: negative Gastrointestinal:  negative Genitourinary:negative Integument/breast: negative Hematologic/lymphatic: negative Musculoskeletal:negative Neurological: negative Behavioral/Psych: negative Endocrine: negative Allergic/Immunologic: negative   PHYSICAL EXAMINATION: General appearance: alert, cooperative and no distress Head: Normocephalic, without obvious abnormality, atraumatic Neck: no adenopathy, no carotid bruit, no JVD, supple, symmetrical, trachea midline and thyroid not enlarged, symmetric, no tenderness/mass/nodules Lymph nodes: Cervical, supraclavicular, and axillary nodes normal. Resp: clear to auscultation bilaterally Back: symmetric, no curvature. ROM normal. No CVA tenderness. Cardio: regular rate and rhythm, S1, S2 normal, no murmur, click, rub or gallop GI: soft, non-tender; bowel sounds normal; no masses,  no organomegaly Extremities: extremities normal, atraumatic, no cyanosis or edema Neurologic: Alert and oriented X 3, normal strength and tone. Normal symmetric reflexes. Normal coordination and gait  ECOG PERFORMANCE STATUS: 1 - Symptomatic but completely ambulatory  Blood pressure 154/74, pulse 84, temperature 97.4 F (36.3 C), temperature source Oral, resp. rate 18, height 5\' 6"  (1.676 m), weight 178 lb 6.4 oz (80.922 kg), SpO2 100.00%.  LABORATORY DATA: Lab Results  Component Value Date   WBC 6.2 12/23/2013   HGB 13.9 12/23/2013   HCT 41.1 12/23/2013   MCV 94.7 12/23/2013   PLT 212 12/23/2013      Chemistry      Component Value Date/Time   NA 142 12/16/2013 1226   K 3.1* 12/16/2013 1226   CO2 28 12/16/2013 1226   BUN 14.1 12/16/2013 1226   CREATININE 0.8 12/16/2013 1226      Component Value Date/Time   CALCIUM 10.2 12/16/2013 1226   ALKPHOS 108 12/16/2013 1226   AST 33 12/16/2013 1226   ALT 47 12/16/2013 1226   BILITOT 0.44 12/16/2013 1226       RADIOGRAPHIC STUDIES:  Mm Digital Diagnostic Unilat L  12/10/2013   CLINICAL DATA:  73 year old female with history of pain in the  medial left breast as well as a possible palpable abnormality, however this has since resolves and patient denies any current pain or palpable abnormalities.  EXAM: DIGITAL DIAGNOSTIC  LEFT MAMMOGRAM WITH CAD  COMPARISON:  Previous exams.  ACR Breast Density Category c: The breast tissue is heterogeneously dense, which may obscure small masses.  FINDINGS: No suspicious masses or calcifications are seen in the left breast. There is no mammographic evidence of malignancy in the left breast.  Mammographic images were processed with CAD.  IMPRESSION: No mammographic evidence of malignancy in the left breast.  RECOMMENDATION: 1. Recommend further decisions/management of the left breast pain (which has reportedly since resolved) be based on clinical grounds.  2. Routine screening mammography, for which the patient will be due May 2015.  I have discussed the findings and recommendations with the patient. Results were also provided in writing at the conclusion of the visit. If applicable, a reminder letter will be sent to the patient regarding the next appointment.  BI-RADS CATEGORY  1: Negative.   Electronically Signed   By: Everlean Alstrom M.D.   On: 12/10/2013 14:47     ASSESSMENT/PLAN: The patient is a very  pleasant 73 year old Caucasian female recently diagnosed with stage IIIB (T3, N3, M0) non-small cell lung cancer consistent with squamous cell carcinoma presenting with a large right upper lobe mass with mediastinal lymphadenopathy and contralateral supraclavicular lymph nodes. She's currently being treated with a course of concurrent chemoradiation status post 1 week of treatment. Overall she's tolerating her treatment without difficulty. The patient was discussed with him also seen by Dr. Julien Nordmann. She will continue on her course of concurrent chemoradiation in followup in 3 weeks  for another symptom management visit.     Wynetta Emery, Arnetia Bronk E, PA-C All questions were answered. The patient knows to call the  clinic with any problems, questions or concerns. We can certainly see the patient much sooner if necessary.  ADDENDUM:  Hematology/Oncology Attending:  I had a face to face encounter with the patient today. I recommended her care plan. This is a very pleasant 73 years old white female recently diagnosed with a stage IIIB non-small cell lung cancer. She is currently undergoing a course of concurrent chemoradiation with weekly carboplatin and paclitaxel and tolerating her treatment fairly well. She denied having any significant nausea or vomiting, no fever or chills. I recommended for the patient to continue with her treatment with concurrent chemoradiation as scheduled. She would come back for follow up visit in 3 weeks for evaluation and management any adverse effect of her treatment. She was advised to call immediately if she has any concerning symptoms in the interval. Eilleen Kempf., MD 12/23/2013

## 2013-12-23 NOTE — Telephone Encounter (Signed)
Gave pt appt for lab,md and chemo for MArch 2015

## 2013-12-23 NOTE — Patient Instructions (Signed)
Continue her course of concurrent chemoradiation as scheduled Followup in 3 weeks

## 2013-12-24 ENCOUNTER — Ambulatory Visit (HOSPITAL_COMMUNITY)
Admission: RE | Admit: 2013-12-24 | Discharge: 2013-12-24 | Disposition: A | Discharge status: Home / Self Care | Payer: Medicare Other | Source: Ambulatory Visit | Attending: Internal Medicine | Admitting: Internal Medicine

## 2013-12-24 ENCOUNTER — Ambulatory Visit
Admission: RE | Admit: 2013-12-24 | Discharge: 2013-12-24 | Disposition: A | Discharge status: Home / Self Care | Payer: Medicare Other | Source: Ambulatory Visit | Attending: Radiation Oncology | Admitting: Radiation Oncology

## 2013-12-24 DIAGNOSIS — C3490 Malignant neoplasm of unspecified part of unspecified bronchus or lung: Secondary | ICD-10-CM

## 2013-12-24 MED ORDER — GADOBENATE DIMEGLUMINE 529 MG/ML IV SOLN
15.0000 mL | Freq: Once | INTRAVENOUS | Status: AC | PRN
  Administered 2013-12-24: 15 mL via INTRAVENOUS

## 2013-12-25 ENCOUNTER — Other Ambulatory Visit: Payer: Self-pay | Admitting: *Deleted

## 2013-12-25 ENCOUNTER — Telehealth: Payer: Self-pay | Admitting: *Deleted

## 2013-12-25 ENCOUNTER — Ambulatory Visit
Admission: RE | Admit: 2013-12-25 | Discharge: 2013-12-25 | Disposition: A | Discharge status: Home / Self Care | Payer: Medicare Other | Source: Ambulatory Visit | Attending: Radiation Oncology | Admitting: Radiation Oncology

## 2013-12-25 NOTE — Telephone Encounter (Signed)
   Provider input needed: diarrhea, fever   Reason for call: "Reporting symptoms and side effects as instructed"  Constitutional: positive for fevers Gastrointestinal: positive for diarrhea   Patient last received chemotherapy/ treatment on 12-23-2013 Taxol/carboplatin  Patient was last seen in the office on 12-23-2013 with [name]  Next appt is 01-13-2014 with Dr. Julien Nordmann  Is patient having fevers greater than 100.5?  no, Temp = 98.9.  "My base temp = 97.5 so this is high for me."   Is patient having uncontrolled pain, or new pain? no   Is patient having new back pain that changes with position (worsens or eases when laying down?)  no   Is patient able to eat and drink? yes, ate 2 poached eggs, toast and O.J. This morning   Is patient able to pass stool without difficulty?   yes     Is patient having uncontrolled nausea?  no    patient calls 12/25/2013 with complaint of  Constitutional: positive for fevers Gastrointestinal: positive for diarrhea Diarrhea started about 8:50 am.  reports about four stools and has taken kaopectate and now feels better.     Summary Based on the above information advised patient to  Await return call with information from from provider and to mention this to RT staff.   Megan [name], Megan [name]  12/25/2013, 9:18 AM   Background Info  [name]   DOB: 1941-07-08   MR#: [id number]   CSN#   [id number] 12/25/2013

## 2013-12-25 NOTE — Telephone Encounter (Signed)
No new note.

## 2013-12-25 NOTE — Telephone Encounter (Signed)
Pt called and spoke to desk RN after speaking to triage.  Addressed concerns regarding diarrhea and her temperature.  Advised imodium prn for diarrhea and to drink plenty of fluids, and to call if she develops temp of 100.5 or greater.  She verbalized understanding.  She states she is feeling better now.  SLJ

## 2013-12-26 ENCOUNTER — Ambulatory Visit
Admission: RE | Admit: 2013-12-26 | Discharge: 2013-12-26 | Disposition: A | Discharge status: Home / Self Care | Payer: Medicare Other | Source: Ambulatory Visit | Attending: Radiation Oncology | Admitting: Radiation Oncology

## 2013-12-27 ENCOUNTER — Ambulatory Visit
Admission: RE | Admit: 2013-12-27 | Discharge: 2013-12-27 | Disposition: A | Discharge status: Home / Self Care | Payer: Medicare Other | Source: Ambulatory Visit | Attending: Radiation Oncology | Admitting: Radiation Oncology

## 2013-12-27 ENCOUNTER — Encounter: Payer: Self-pay | Admitting: Radiation Oncology

## 2013-12-27 VITALS — BP 152/88 | HR 93 | Temp 97.6°F | Resp 20 | Wt 176.9 lb

## 2013-12-27 DIAGNOSIS — C3490 Malignant neoplasm of unspecified part of unspecified bronchus or lung: Secondary | ICD-10-CM

## 2013-12-27 MED ORDER — HYDROCODONE-ACETAMINOPHEN 7.5-325 MG/15ML PO SOLN: 5.0000 mL | Freq: Four times a day (QID) | ORAL | Status: DC | PRN

## 2013-12-27 MED ORDER — SUCRALFATE 1 G PO TABS: 1.0000 g | ORAL_TABLET | Freq: Three times a day (TID) | ORAL | Status: DC

## 2013-12-27 NOTE — Progress Notes (Signed)
Weekly rad txs rt lung, 9/30 completed,c/o sore throat now, on  Feb 25th  Had fever and diarrhea,&6th fever and diarrhea,  She called and spoke with  Primary MD nurse, takes tylenol e.s. Prn, and cepacol throat lozenges resolved, but throat hurts with swallowing,not on Carafate as yet, no fever today, no sob, 99% room air sats,  No nausea ,  12:06 PM

## 2013-12-27 NOTE — Progress Notes (Signed)
  Radiation Oncology         [phone] ________________________________  Name: Megan [name] MRN: [id number]  Date: 12/27/2013  DOB: 11/04/1940  Weekly Radiation Therapy Management  Current Dose: 18 Gy     Planned Dose:  60 Gy  Narrative . . . . . . . . The patient presents for routine under treatment assessment.                                  Weekly rad txs rt lung, 9/30 completed,c/o sore throat now, on Feb 25th Had fever and diarrhea,&6th fever and diarrhea, She called and spoke with Primary MD nurse, takes tylenol e.s. Prn, and cepacol throat lozenges resolved, but throat hurts with swallowing,not on Carafate as yet, no fever today, no sob, 99% room air sats, No nausea            The patient is without complaint.                                 Set-up films were reviewed.                                 The chart was checked. Physical Findings. . .  weight is 176 lb 14.4 oz (80.241 kg). Her oral temperature is 97.6 F (36.4 C). Her blood pressure is 152/88 and her pulse is 93. Her respiration is 20 and oxygen saturation is 98%. . Weight essentially stable.  No significant changes. Impression . . . . . . . The patient is tolerating radiation. Plan . . . . . . . . . . . . Continue treatment as planned.  Given lortab and carafate.  ________________________________  Sheral Apley Tammi Klippel, M.D.

## 2013-12-30 ENCOUNTER — Encounter: Payer: Self-pay | Admitting: Specialist

## 2013-12-30 ENCOUNTER — Ambulatory Visit
Admission: RE | Admit: 2013-12-30 | Discharge: 2013-12-30 | Disposition: A | Discharge status: Home / Self Care | Payer: Medicare Other | Source: Ambulatory Visit | Attending: Radiation Oncology | Admitting: Radiation Oncology

## 2013-12-30 ENCOUNTER — Other Ambulatory Visit (HOSPITAL_BASED_OUTPATIENT_CLINIC_OR_DEPARTMENT_OTHER): Payer: Medicare Other

## 2013-12-30 ENCOUNTER — Ambulatory Visit (HOSPITAL_BASED_OUTPATIENT_CLINIC_OR_DEPARTMENT_OTHER): Payer: Medicare Other

## 2013-12-30 VITALS — BP 132/83 | HR 80 | Temp 97.9°F

## 2013-12-30 DIAGNOSIS — C3490 Malignant neoplasm of unspecified part of unspecified bronchus or lung: Secondary | ICD-10-CM

## 2013-12-30 DIAGNOSIS — C342 Malignant neoplasm of middle lobe, bronchus or lung: Secondary | ICD-10-CM

## 2013-12-30 DIAGNOSIS — Z5111 Encounter for antineoplastic chemotherapy: Secondary | ICD-10-CM

## 2013-12-30 LAB — CBC WITH DIFFERENTIAL/PLATELET
BASO%: 0.9 % (ref 0.0–2.0)
Basophils Absolute: 0 10*3/uL (ref 0.0–0.1)
EOS%: 2.4 % (ref 0.0–7.0)
Eosinophils Absolute: 0.1 10*3/uL (ref 0.0–0.5)
HEMATOCRIT: 41.5 % (ref 34.8–46.6)
HGB: 13.8 g/dL (ref 11.6–15.9)
LYMPH#: 0.8 10*3/uL — AB (ref 0.9–3.3)
LYMPH%: 17.8 % (ref 14.0–49.7)
MCH: 31.5 pg (ref 25.1–34.0)
MCHC: 33.3 g/dL (ref 31.5–36.0)
MCV: 94.7 fL (ref 79.5–101.0)
MONO#: 0.4 10*3/uL (ref 0.1–0.9)
MONO%: 8.7 % (ref 0.0–14.0)
NEUT#: 3.2 10*3/uL (ref 1.5–6.5)
NEUT%: 70.2 % (ref 38.4–76.8)
Platelets: 188 10*3/uL (ref 145–400)
RBC: 4.39 10*6/uL (ref 3.70–5.45)
RDW: 14.7 % — ABNORMAL HIGH (ref 11.2–14.5)
WBC: 4.6 10*3/uL (ref 3.9–10.3)

## 2013-12-30 LAB — COMPREHENSIVE METABOLIC PANEL (CC13)
ALT: 48 U/L (ref 0–55)
ANION GAP: 10 meq/L (ref 3–11)
AST: 26 U/L (ref 5–34)
Albumin: 3.4 g/dL — ABNORMAL LOW (ref 3.5–5.0)
Alkaline Phosphatase: 91 U/L (ref 40–150)
BUN: 11 mg/dL (ref 7.0–26.0)
CALCIUM: 9.3 mg/dL (ref 8.4–10.4)
CHLORIDE: 101 meq/L (ref 98–109)
CO2: 27 meq/L (ref 22–29)
Creatinine: 0.8 mg/dL (ref 0.6–1.1)
Glucose: 221 mg/dl — ABNORMAL HIGH (ref 70–140)
Potassium: 3.7 mEq/L (ref 3.5–5.1)
SODIUM: 139 meq/L (ref 136–145)
TOTAL PROTEIN: 6.5 g/dL (ref 6.4–8.3)
Total Bilirubin: 0.49 mg/dL (ref 0.20–1.20)

## 2013-12-30 LAB — TECHNOLOGIST REVIEW

## 2013-12-30 MED ORDER — ONDANSETRON 16 MG/50ML IVPB (CHCC)
INTRAVENOUS | Status: AC
  Filled 2013-12-30: qty 16

## 2013-12-30 MED ORDER — ONDANSETRON 16 MG/50ML IVPB (CHCC)
16.0000 mg | Freq: Once | INTRAVENOUS | Status: AC
  Administered 2013-12-30: 16 mg via INTRAVENOUS

## 2013-12-30 MED ORDER — DEXAMETHASONE SODIUM PHOSPHATE 20 MG/5ML IJ SOLN
INTRAMUSCULAR | Status: AC
  Filled 2013-12-30: qty 5

## 2013-12-30 MED ORDER — SODIUM CHLORIDE 0.9 % IV SOLN
180.6000 mg | Freq: Once | INTRAVENOUS | Status: AC
  Administered 2013-12-30: 180 mg via INTRAVENOUS
  Filled 2013-12-30: qty 18

## 2013-12-30 MED ORDER — HEPARIN SOD (PORK) LOCK FLUSH 100 UNIT/ML IV SOLN
500.0000 [IU] | Freq: Once | INTRAVENOUS | Status: AC | PRN
  Filled 2013-12-30: qty 5

## 2013-12-30 MED ORDER — DIPHENHYDRAMINE HCL 50 MG/ML IJ SOLN
INTRAMUSCULAR | Status: AC
  Filled 2013-12-30: qty 1

## 2013-12-30 MED ORDER — SODIUM CHLORIDE 0.9 % IV SOLN
45.0000 mg/m2 | Freq: Once | INTRAVENOUS | Status: AC
  Administered 2013-12-30: 90 mg via INTRAVENOUS
  Filled 2013-12-30: qty 15

## 2013-12-30 MED ORDER — DIPHENHYDRAMINE HCL 50 MG/ML IJ SOLN
50.0000 mg | Freq: Once | INTRAMUSCULAR | Status: AC
  Administered 2013-12-30: 50 mg via INTRAVENOUS

## 2013-12-30 MED ORDER — FAMOTIDINE IN NACL 20-0.9 MG/50ML-% IV SOLN
20.0000 mg | Freq: Once | INTRAVENOUS | Status: AC
  Administered 2013-12-30: 20 mg via INTRAVENOUS

## 2013-12-30 MED ORDER — SODIUM CHLORIDE 0.9 % IJ SOLN
10.0000 mL | INTRAMUSCULAR | Status: DC | PRN
  Filled 2013-12-30: qty 10

## 2013-12-30 MED ORDER — DEXAMETHASONE SODIUM PHOSPHATE 20 MG/5ML IJ SOLN
20.0000 mg | Freq: Once | INTRAMUSCULAR | Status: AC
  Administered 2013-12-30: 20 mg via INTRAVENOUS

## 2013-12-30 MED ORDER — FAMOTIDINE IN NACL 20-0.9 MG/50ML-% IV SOLN
INTRAVENOUS | Status: AC
  Filled 2013-12-30: qty 50

## 2013-12-30 NOTE — Patient Instructions (Signed)
[facility] Discharge Instructions for Patients Receiving Chemotherapy  Today you received the following chemotherapy agents Taxol and Carob  To help prevent nausea and vomiting after your treatment, we encourage you to take your nausea medication as directed/prescribed   If you develop nausea and vomiting that is not controlled by your nausea medication, call the clinic.   BELOW ARE SYMPTOMS THAT SHOULD BE REPORTED IMMEDIATELY:  *FEVER GREATER THAN 100.5 F  *CHILLS WITH OR WITHOUT FEVER  NAUSEA AND VOMITING THAT IS NOT CONTROLLED WITH YOUR NAUSEA MEDICATION  *UNUSUAL SHORTNESS OF BREATH  *UNUSUAL BRUISING OR BLEEDING  TENDERNESS IN MOUTH AND THROAT WITH OR WITHOUT PRESENCE OF ULCERS  *URINARY PROBLEMS  *BOWEL PROBLEMS  UNUSUAL RASH Items with * indicate a potential emergency and should be followed up as soon as possible.  Feel free to call the clinic you have any questions or concerns. The clinic phone number is [phone].

## 2013-12-30 NOTE — Progress Notes (Signed)
Met with patient while she was having chemo today.  She wrote on her original distress screen that she was "9" on the distress scale. She said that she was still at "9" today, but she seems to be finding a way to cope better with the uncertainty and lack of control that her diagnosis has brought to her life. Chaplain facilitated her progress towards acceptance of a new normal.  Epifania Gore, [facility], PhD

## 2013-12-31 ENCOUNTER — Ambulatory Visit
Admission: RE | Admit: 2013-12-31 | Discharge: 2013-12-31 | Disposition: A | Discharge status: Home / Self Care | Payer: Medicare Other | Source: Ambulatory Visit | Attending: Radiation Oncology | Admitting: Radiation Oncology

## 2014-01-01 ENCOUNTER — Ambulatory Visit
Admission: RE | Admit: 2014-01-01 | Discharge: 2014-01-01 | Disposition: A | Discharge status: Home / Self Care | Payer: Medicare Other | Source: Ambulatory Visit | Attending: Radiation Oncology | Admitting: Radiation Oncology

## 2014-01-02 ENCOUNTER — Ambulatory Visit
Admission: RE | Admit: 2014-01-02 | Discharge: 2014-01-02 | Disposition: A | Discharge status: Home / Self Care | Payer: Medicare Other | Source: Ambulatory Visit | Attending: Radiation Oncology | Admitting: Radiation Oncology

## 2014-01-03 ENCOUNTER — Ambulatory Visit
Admission: RE | Admit: 2014-01-03 | Discharge: 2014-01-03 | Disposition: A | Discharge status: Home / Self Care | Payer: Medicare Other | Source: Ambulatory Visit | Attending: Radiation Oncology | Admitting: Radiation Oncology

## 2014-01-03 ENCOUNTER — Ambulatory Visit: Admission: RE | Admit: 2014-01-03 | Payer: Medicare Other | Source: Ambulatory Visit | Admitting: Radiation Oncology

## 2014-01-03 VITALS — BP 130/57 | HR 94 | Temp 98.2°F | Ht 66.0 in | Wt 171.4 lb

## 2014-01-03 DIAGNOSIS — C3490 Malignant neoplasm of unspecified part of unspecified bronchus or lung: Secondary | ICD-10-CM

## 2014-01-03 MED ORDER — BIAFINE EX EMUL
Freq: Two times a day (BID) | CUTANEOUS | Status: DC
  Administered 2014-01-03: 16:00:00 via TOPICAL

## 2014-01-03 MED ORDER — HYDROCODONE-ACETAMINOPHEN 7.5-325 MG/15ML PO SOLN: 5.0000 mL | Freq: Four times a day (QID) | ORAL | Status: DC | PRN

## 2014-01-03 NOTE — Progress Notes (Signed)
Megan [name] has had 14 fractions to her right lung.  She is having sore throat pain which she says is better today.  She is using hycet and would like a refill.  She denies cough, hemoptysis and shortness of breath.  The skin on her mid chest is red with a raised rash.  She is using biafine and needs another tube.  Another tube has been given.  She had chemotherapy on Monday.  She reports having diarrhea this morning and this afternoon.  She did take two imodium.  She reports a poor appetite.  She has lost 2 lbs since last week.  She reports fatigue.

## 2014-01-03 NOTE — Progress Notes (Signed)
Department of Radiation Oncology  Phone:  [phone] Fax:        [phone]  Weekly Treatment Note    Name: Megan [name] Date: 01/03/2014 MRN: [id number] DOB: [birth year]   Current dose: 28 Gy  Current fraction: 14   MEDICATIONS: Current Outpatient Prescriptions  Medication Sig Dispense Refill  . acetaminophen (TYLENOL) 500 MG tablet Take by mouth every 6 (six) hours as needed.      [name] BIOTIN PO Take 1 tablet by mouth daily.      . calcipotriene-betamethasone (TACLONEX) ointment Apply 1 application topically daily as needed (rash).       . Cholecalciferol (VITAMIN D PO) Take 1 tablet by mouth daily.      . clotrimazole (LOTRIMIN) 1 % cream Apply 1 application topically daily.       . Coenzyme Q10 (CO Q 10 PO) Take 1 capsule by mouth daily.      . Cyanocobalamin (B-12 PO) Take 1 tablet by mouth daily. 200      . emollient (BIAFINE) cream Apply 1 application topically 2 (two) times daily. Apply to skin chest area bid after rad tx and bedtime daily      . fluticasone (CUTIVATE) 0.05 % cream Apply topically 2 (two) times daily.      [name] HYDROcodone-acetaminophen (HYCET) 7.5-325 mg/15 ml solution Take 5-15 mLs by mouth 4 (four) times daily as needed for moderate pain.  473 mL  0  . losartan-hydrochlorothiazide (HYZAAR) 100-25 MG per tablet Take 1 tablet by mouth daily.      [name] menthol-cetylpyridinium (CEPACOL) 3 MG lozenge Take 1 lozenge by mouth as needed for sore throat.      . metFORMIN (GLUCOPHAGE) 500 MG tablet Take 500 mg by mouth 2 (two) times daily with a meal.      . Nutritional Supplements (ESTROVEN PO) Take 1 tablet by mouth daily.      . potassium chloride SA (K-DUR,KLOR-CON) 20 MEQ tablet Take 1 tablet (20 mEq total) by mouth daily.  7 tablet  0  . prochlorperazine (COMPAZINE) 10 MG tablet Take 1 tablet (10 mg total) by mouth every 6 (six) hours as needed for nausea or vomiting.  60 tablet  0  . sucralfate (CARAFATE) 1 G tablet Take 1 tablet (1 g total) by mouth 4  (four) times daily -  with meals and at bedtime. 5 min before eating for esophagus irritation  120 tablet  5   Current Facility-Administered Medications  Medication Dose Route Frequency Provider Last Rate Last Dose  . topical emolient (BIAFINE) emulsion   Topical BID Marye Round, MD       Facility-Administered Medications Ordered in Other Encounters  Medication Dose Route Frequency Provider Last Rate Last Dose  . sodium chloride 0.9 % injection 10 mL  10 mL Intracatheter PRN Curt Bears, MD         ALLERGIES: Remicade   LABORATORY DATA:  Lab Results  Component Value Date   WBC 4.6 12/30/2013   HGB 13.8 12/30/2013   HCT 41.5 12/30/2013   MCV 94.7 12/30/2013   PLT 188 12/30/2013   Lab Results  Component Value Date   NA 139 12/30/2013   K 3.7 12/30/2013   CO2 27 12/30/2013   Lab Results  Component Value Date   ALT 48 12/30/2013   AST 26 12/30/2013   ALKPHOS 91 12/30/2013   BILITOT 0.49 12/30/2013     NARRATIVE: Megan [name] was seen today for weekly treatment management. The chart  was checked and the patient's films were reviewed. The patient states that she is doing relatively well. She does complain of a sore throat but states that this is better today. She requested a refill on her pain medication. She is notice some emerging skin changes in the chest area extending to the neck.  PHYSICAL EXAMINATION: height is 5\' 6"  (1.676 m) and weight is 171 lb 6.4 oz (77.747 kg). Her temperature is 98.2 F (36.8 C). Her blood pressure is 130/57 and her pulse is 94. Her oxygen saturation is 100%.        ASSESSMENT: The patient is doing satisfactorily with treatment.  PLAN: We will continue with the patient's radiation treatment as planned. Pain medication refill given today.

## 2014-01-06 ENCOUNTER — Ambulatory Visit
Admission: RE | Admit: 2014-01-06 | Discharge: 2014-01-06 | Disposition: A | Discharge status: Home / Self Care | Payer: Medicare Other | Source: Ambulatory Visit | Attending: Radiation Oncology | Admitting: Radiation Oncology

## 2014-01-06 ENCOUNTER — Other Ambulatory Visit (HOSPITAL_BASED_OUTPATIENT_CLINIC_OR_DEPARTMENT_OTHER): Payer: Medicare Other

## 2014-01-06 ENCOUNTER — Ambulatory Visit (HOSPITAL_BASED_OUTPATIENT_CLINIC_OR_DEPARTMENT_OTHER): Payer: Medicare Other

## 2014-01-06 VITALS — BP 132/71 | HR 81 | Temp 97.5°F | Resp 18

## 2014-01-06 DIAGNOSIS — C342 Malignant neoplasm of middle lobe, bronchus or lung: Secondary | ICD-10-CM

## 2014-01-06 DIAGNOSIS — C3490 Malignant neoplasm of unspecified part of unspecified bronchus or lung: Secondary | ICD-10-CM

## 2014-01-06 DIAGNOSIS — Z5111 Encounter for antineoplastic chemotherapy: Secondary | ICD-10-CM

## 2014-01-06 LAB — CBC WITH DIFFERENTIAL/PLATELET
BASO%: 0.6 % (ref 0.0–2.0)
Basophils Absolute: 0 10e3/uL (ref 0.0–0.1)
EOS%: 1.8 % (ref 0.0–7.0)
Eosinophils Absolute: 0.1 10e3/uL (ref 0.0–0.5)
HCT: 40.2 % (ref 34.8–46.6)
HGB: 13.6 g/dL (ref 11.6–15.9)
LYMPH%: 12.5 % — ABNORMAL LOW (ref 14.0–49.7)
MCH: 32.2 pg (ref 25.1–34.0)
MCHC: 33.8 g/dL (ref 31.5–36.0)
MCV: 95.4 fL (ref 79.5–101.0)
MONO#: 0.4 10e3/uL (ref 0.1–0.9)
MONO%: 10.1 % (ref 0.0–14.0)
NEUT#: 3.2 10e3/uL (ref 1.5–6.5)
NEUT%: 75 % (ref 38.4–76.8)
Platelets: 181 10e3/uL (ref 145–400)
RBC: 4.22 10e6/uL (ref 3.70–5.45)
RDW: 14.7 % — ABNORMAL HIGH (ref 11.2–14.5)
WBC: 4.2 10e3/uL (ref 3.9–10.3)
lymph#: 0.5 10e3/uL — ABNORMAL LOW (ref 0.9–3.3)

## 2014-01-06 LAB — COMPREHENSIVE METABOLIC PANEL (CC13)
ALBUMIN: 3.3 g/dL — AB (ref 3.5–5.0)
ALK PHOS: 83 U/L (ref 40–150)
ALT: 63 U/L — ABNORMAL HIGH (ref 0–55)
AST: 31 U/L (ref 5–34)
Anion Gap: 11 mEq/L (ref 3–11)
BUN: 14.5 mg/dL (ref 7.0–26.0)
CO2: 27 mEq/L (ref 22–29)
Calcium: 8.9 mg/dL (ref 8.4–10.4)
Chloride: 102 mEq/L (ref 98–109)
Creatinine: 0.8 mg/dL (ref 0.6–1.1)
Glucose: 213 mg/dl — ABNORMAL HIGH (ref 70–140)
POTASSIUM: 3.5 meq/L (ref 3.5–5.1)
SODIUM: 140 meq/L (ref 136–145)
Total Bilirubin: 0.49 mg/dL (ref 0.20–1.20)
Total Protein: 6.3 g/dL — ABNORMAL LOW (ref 6.4–8.3)

## 2014-01-06 LAB — TECHNOLOGIST REVIEW

## 2014-01-06 MED ORDER — ONDANSETRON 16 MG/50ML IVPB (CHCC)
16.0000 mg | Freq: Once | INTRAVENOUS | Status: AC
Start: 2014-01-06 — End: 2014-01-06
  Administered 2014-01-06: 16 mg via INTRAVENOUS

## 2014-01-06 MED ORDER — FAMOTIDINE IN NACL 20-0.9 MG/50ML-% IV SOLN
INTRAVENOUS | Status: AC
  Filled 2014-01-06: qty 50

## 2014-01-06 MED ORDER — FAMOTIDINE IN NACL 20-0.9 MG/50ML-% IV SOLN
20.0000 mg | Freq: Once | INTRAVENOUS | Status: AC
  Administered 2014-01-06: 20 mg via INTRAVENOUS

## 2014-01-06 MED ORDER — SODIUM CHLORIDE 0.9 % IV SOLN
180.0000 mg | Freq: Once | INTRAVENOUS | Status: AC
  Administered 2014-01-06: 180 mg via INTRAVENOUS
  Filled 2014-01-06: qty 18

## 2014-01-06 MED ORDER — DIPHENHYDRAMINE HCL 50 MG/ML IJ SOLN
INTRAMUSCULAR | Status: AC
  Filled 2014-01-06: qty 1

## 2014-01-06 MED ORDER — ONDANSETRON 16 MG/50ML IVPB (CHCC)
INTRAVENOUS | Status: AC
Start: 2014-01-06 — End: 2014-01-06
  Filled 2014-01-06: qty 16

## 2014-01-06 MED ORDER — SODIUM CHLORIDE 0.9 % IV SOLN
45.0000 mg/m2 | Freq: Once | INTRAVENOUS | Status: AC
  Administered 2014-01-06: 90 mg via INTRAVENOUS
  Filled 2014-01-06: qty 15

## 2014-01-06 MED ORDER — SODIUM CHLORIDE 0.9 % IV SOLN
Freq: Once | INTRAVENOUS | Status: AC
  Administered 2014-01-06: 09:00:00 via INTRAVENOUS

## 2014-01-06 MED ORDER — DEXAMETHASONE SODIUM PHOSPHATE 20 MG/5ML IJ SOLN
20.0000 mg | Freq: Once | INTRAMUSCULAR | Status: AC
  Administered 2014-01-06: 20 mg via INTRAVENOUS

## 2014-01-06 MED ORDER — DEXAMETHASONE SODIUM PHOSPHATE 20 MG/5ML IJ SOLN
INTRAMUSCULAR | Status: AC
  Filled 2014-01-06: qty 5

## 2014-01-06 MED ORDER — DIPHENHYDRAMINE HCL 50 MG/ML IJ SOLN
50.0000 mg | Freq: Once | INTRAMUSCULAR | Status: AC
  Administered 2014-01-06: 50 mg via INTRAVENOUS

## 2014-01-06 NOTE — Progress Notes (Signed)
Pt reports last Thursday she had 7 episodes of diarrhea in the AM and 2 episodes in the afternoon. She did take immodium x1 with easing of symptoms. Pt reports she went to urgent medical care on Saturday 01/04/14 for a diagnosis of cystitis with chief complaints of burning on urination. She was put on cipro 500 mg 1 BID and phenazopyridine 200 mg 1 TID after meals and has been taking these since Saturday. see flowsheet for complete infusion room RN assessment. Dr. Julien Nordmann notified via Rudene Anda, RN with verbal OK to proceed with chemotherapy.

## 2014-01-06 NOTE — Patient Instructions (Signed)
[facility] Discharge Instructions for Patients Receiving Chemotherapy  Today you received the following chemotherapy agents: Taxol and Carboplatin.  To help prevent nausea and vomiting after your treatment, we encourage you to take your nausea medication as prescribed.   If you develop nausea and vomiting that is not controlled by your nausea medication, call the clinic.   BELOW ARE SYMPTOMS THAT SHOULD BE REPORTED IMMEDIATELY:  *FEVER GREATER THAN 100.5 F  *CHILLS WITH OR WITHOUT FEVER  NAUSEA AND VOMITING THAT IS NOT CONTROLLED WITH YOUR NAUSEA MEDICATION  *UNUSUAL SHORTNESS OF BREATH  *UNUSUAL BRUISING OR BLEEDING  TENDERNESS IN MOUTH AND THROAT WITH OR WITHOUT PRESENCE OF ULCERS  *URINARY PROBLEMS  *BOWEL PROBLEMS  UNUSUAL RASH Items with * indicate a potential emergency and should be followed up as soon as possible.  Feel free to call the clinic you have any questions or concerns. The clinic phone number is [phone].    

## 2014-01-07 ENCOUNTER — Ambulatory Visit
Admission: RE | Admit: 2014-01-07 | Discharge: 2014-01-07 | Disposition: A | Discharge status: Home / Self Care | Payer: Medicare Other | Source: Ambulatory Visit | Attending: Radiation Oncology | Admitting: Radiation Oncology

## 2014-01-07 ENCOUNTER — Telehealth: Payer: Self-pay | Admitting: Internal Medicine

## 2014-01-07 NOTE — Telephone Encounter (Signed)
Pt came by and would like to change chemo to later on 3/16th MD and lab am and chemo pm, left message with Sharyn Lull

## 2014-01-08 ENCOUNTER — Ambulatory Visit
Admission: RE | Admit: 2014-01-08 | Discharge: 2014-01-08 | Disposition: A | Discharge status: Home / Self Care | Payer: Medicare Other | Source: Ambulatory Visit | Attending: Radiation Oncology | Admitting: Radiation Oncology

## 2014-01-09 ENCOUNTER — Ambulatory Visit
Admission: RE | Admit: 2014-01-09 | Discharge: 2014-01-09 | Disposition: A | Discharge status: Home / Self Care | Payer: Medicare Other | Source: Ambulatory Visit | Attending: Radiation Oncology | Admitting: Radiation Oncology

## 2014-01-10 ENCOUNTER — Ambulatory Visit
Admission: RE | Admit: 2014-01-10 | Discharge: 2014-01-10 | Disposition: A | Discharge status: Home / Self Care | Payer: Medicare Other | Source: Ambulatory Visit | Attending: Radiation Oncology | Admitting: Radiation Oncology

## 2014-01-10 ENCOUNTER — Encounter: Payer: Self-pay | Admitting: Radiation Oncology

## 2014-01-10 VITALS — BP 104/70 | HR 91 | Temp 98.1°F | Resp 20 | Wt 174.4 lb

## 2014-01-10 DIAGNOSIS — C3490 Malignant neoplasm of unspecified part of unspecified bronchus or lung: Secondary | ICD-10-CM

## 2014-01-10 NOTE — Progress Notes (Signed)
Department of Radiation Oncology  Phone:  [phone] Fax:        [phone]  Weekly Treatment Note    Name: Megan [name] Date: 01/10/2014 MRN: [id number] DOB: February 22, 1941   Current dose: 38 Gy  Current fraction: 19   MEDICATIONS: Current Outpatient Prescriptions  Medication Sig Dispense Refill  . acetaminophen (TYLENOL) 500 MG tablet Take by mouth every 6 (six) hours as needed.      [name] BIOTIN PO Take 1 tablet by mouth daily.      . calcipotriene-betamethasone (TACLONEX) ointment Apply 1 application topically daily as needed (rash).       . Cholecalciferol (VITAMIN D PO) Take 1 tablet by mouth daily.      . clotrimazole (LOTRIMIN) 1 % cream Apply 1 application topically daily.       . Coenzyme Q10 (CO Q 10 PO) Take 1 capsule by mouth daily.      . Cyanocobalamin (B-12 PO) Take 1 tablet by mouth daily. 200      . emollient (BIAFINE) cream Apply 1 application topically 2 (two) times daily. Apply to skin chest area bid after rad tx and bedtime daily      . fluticasone (CUTIVATE) 0.05 % cream Apply topically 2 (two) times daily.      [name] HYDROcodone-acetaminophen (HYCET) 7.5-325 mg/15 ml solution Take 5-15 mLs by mouth 4 (four) times daily as needed for moderate pain.  473 mL  0  . losartan-hydrochlorothiazide (HYZAAR) 100-25 MG per tablet Take 1 tablet by mouth daily.      [name] menthol-cetylpyridinium (CEPACOL) 3 MG lozenge Take 1 lozenge by mouth as needed for sore throat.      . metFORMIN (GLUCOPHAGE) 500 MG tablet Take 500 mg by mouth 2 (two) times daily with a meal.      . Nutritional Supplements (ESTROVEN PO) Take 1 tablet by mouth daily.      . potassium chloride SA (K-DUR,KLOR-CON) 20 MEQ tablet Take 1 tablet (20 mEq total) by mouth daily.  7 tablet  0  . prochlorperazine (COMPAZINE) 10 MG tablet Take 1 tablet (10 mg total) by mouth every 6 (six) hours as needed for nausea or vomiting.  60 tablet  0  . sucralfate (CARAFATE) 1 G tablet Take 1 tablet (1 g total) by mouth 4  (four) times daily -  with meals and at bedtime. 5 min before eating for esophagus irritation  120 tablet  5   No current facility-administered medications for this encounter.   Facility-Administered Medications Ordered in Other Encounters  Medication Dose Route Frequency Provider Last Rate Last Dose  . sodium chloride 0.9 % injection 10 mL  10 mL Intracatheter PRN Curt Bears, MD         ALLERGIES: Remicade   LABORATORY DATA:  Lab Results  Component Value Date   WBC 4.2 01/06/2014   HGB 13.6 01/06/2014   HCT 40.2 01/06/2014   MCV 95.4 01/06/2014   PLT 181 01/06/2014   Lab Results  Component Value Date   NA 140 01/06/2014   K 3.5 01/06/2014   CO2 27 01/06/2014   Lab Results  Component Value Date   ALT 63* 01/06/2014   AST 31 01/06/2014   ALKPHOS 83 01/06/2014   BILITOT 0.49 01/06/2014     NARRATIVE: Megan [name] was seen today for weekly treatment management. The chart was checked and the patient's films were reviewed. The patient states she is experiencing some ongoing esophagitis. Some hoarseness as well. She began using  Hycet medication for pain today which I believe will help significantly.  PHYSICAL EXAMINATION: weight is 174 lb 6.4 oz (79.107 kg). Her oral temperature is 98.1 F (36.7 C). Her blood pressure is 104/70 and her pulse is 91. Her respiration is 20 and oxygen saturation is 98%.        ASSESSMENT: The patient is doing satisfactorily with treatment.  PLAN: We will continue with the patient's radiation treatment as planned.

## 2014-01-10 NOTE — Progress Notes (Signed)
Pt states she began getting hoarse on Wed and noticed a sore throat. She states she "found her " Hycet and took first dose today at 12 noon. She reports dry cough, fatigue, loss of appetite. She is applying Biafine to chest treatment area for hyperpigmentation.

## 2014-01-13 ENCOUNTER — Ambulatory Visit
Admission: RE | Admit: 2014-01-13 | Discharge: 2014-01-13 | Disposition: A | Discharge status: Home / Self Care | Payer: Medicare Other | Source: Ambulatory Visit | Attending: Radiation Oncology | Admitting: Radiation Oncology

## 2014-01-13 ENCOUNTER — Other Ambulatory Visit (HOSPITAL_BASED_OUTPATIENT_CLINIC_OR_DEPARTMENT_OTHER): Payer: Medicare Other

## 2014-01-13 ENCOUNTER — Ambulatory Visit (HOSPITAL_BASED_OUTPATIENT_CLINIC_OR_DEPARTMENT_OTHER): Payer: Medicare Other | Admitting: Internal Medicine

## 2014-01-13 ENCOUNTER — Encounter: Payer: Self-pay | Admitting: Internal Medicine

## 2014-01-13 ENCOUNTER — Ambulatory Visit (HOSPITAL_BASED_OUTPATIENT_CLINIC_OR_DEPARTMENT_OTHER): Payer: Medicare Other

## 2014-01-13 VITALS — BP 107/46 | HR 102 | Temp 97.8°F | Resp 19 | Ht 66.0 in | Wt 170.2 lb

## 2014-01-13 DIAGNOSIS — C342 Malignant neoplasm of middle lobe, bronchus or lung: Secondary | ICD-10-CM

## 2014-01-13 DIAGNOSIS — Z5111 Encounter for antineoplastic chemotherapy: Secondary | ICD-10-CM

## 2014-01-13 DIAGNOSIS — I10 Essential (primary) hypertension: Secondary | ICD-10-CM

## 2014-01-13 DIAGNOSIS — C3490 Malignant neoplasm of unspecified part of unspecified bronchus or lung: Secondary | ICD-10-CM

## 2014-01-13 DIAGNOSIS — M069 Rheumatoid arthritis, unspecified: Secondary | ICD-10-CM

## 2014-01-13 DIAGNOSIS — E119 Type 2 diabetes mellitus without complications: Secondary | ICD-10-CM

## 2014-01-13 DIAGNOSIS — C770 Secondary and unspecified malignant neoplasm of lymph nodes of head, face and neck: Secondary | ICD-10-CM

## 2014-01-13 LAB — CBC WITH DIFFERENTIAL/PLATELET
BASO%: 0.8 % (ref 0.0–2.0)
Basophils Absolute: 0 10*3/uL (ref 0.0–0.1)
EOS%: 2.4 % (ref 0.0–7.0)
Eosinophils Absolute: 0.1 10*3/uL (ref 0.0–0.5)
HCT: 41.1 % (ref 34.8–46.6)
HGB: 13.8 g/dL (ref 11.6–15.9)
LYMPH%: 6.2 % — ABNORMAL LOW (ref 14.0–49.7)
MCH: 31.9 pg (ref 25.1–34.0)
MCHC: 33.7 g/dL (ref 31.5–36.0)
MCV: 94.9 fL (ref 79.5–101.0)
MONO#: 0.5 10*3/uL (ref 0.1–0.9)
MONO%: 12.9 % (ref 0.0–14.0)
NEUT#: 2.8 10*3/uL (ref 1.5–6.5)
NEUT%: 77.7 % — ABNORMAL HIGH (ref 38.4–76.8)
Platelets: 125 10*3/uL — ABNORMAL LOW (ref 145–400)
RBC: 4.33 10*6/uL (ref 3.70–5.45)
RDW: 15.2 % — ABNORMAL HIGH (ref 11.2–14.5)
WBC: 3.6 10*3/uL — ABNORMAL LOW (ref 3.9–10.3)
lymph#: 0.2 10*3/uL — ABNORMAL LOW (ref 0.9–3.3)

## 2014-01-13 LAB — COMPREHENSIVE METABOLIC PANEL (CC13)
ALT: 48 U/L (ref 0–55)
AST: 25 U/L (ref 5–34)
Albumin: 3.3 g/dL — ABNORMAL LOW (ref 3.5–5.0)
Alkaline Phosphatase: 74 U/L (ref 40–150)
Anion Gap: 10 mEq/L (ref 3–11)
BUN: 13.1 mg/dL (ref 7.0–26.0)
CO2: 26 mEq/L (ref 22–29)
Calcium: 8.8 mg/dL (ref 8.4–10.4)
Chloride: 102 mEq/L (ref 98–109)
Creatinine: 0.9 mg/dL (ref 0.6–1.1)
Glucose: 234 mg/dl — ABNORMAL HIGH (ref 70–140)
Potassium: 3.4 mEq/L — ABNORMAL LOW (ref 3.5–5.1)
Sodium: 138 mEq/L (ref 136–145)
Total Bilirubin: 0.71 mg/dL (ref 0.20–1.20)
Total Protein: 6.5 g/dL (ref 6.4–8.3)

## 2014-01-13 LAB — TECHNOLOGIST REVIEW

## 2014-01-13 MED ORDER — DEXAMETHASONE SODIUM PHOSPHATE 20 MG/5ML IJ SOLN
INTRAMUSCULAR | Status: AC
  Filled 2014-01-13: qty 5

## 2014-01-13 MED ORDER — DEXAMETHASONE SODIUM PHOSPHATE 20 MG/5ML IJ SOLN
20.0000 mg | Freq: Once | INTRAMUSCULAR | Status: AC
  Administered 2014-01-13: 20 mg via INTRAVENOUS

## 2014-01-13 MED ORDER — DIPHENHYDRAMINE HCL 50 MG/ML IJ SOLN
50.0000 mg | Freq: Once | INTRAMUSCULAR | Status: AC
  Administered 2014-01-13: 50 mg via INTRAVENOUS

## 2014-01-13 MED ORDER — FAMOTIDINE IN NACL 20-0.9 MG/50ML-% IV SOLN
INTRAVENOUS | Status: AC
  Filled 2014-01-13: qty 50

## 2014-01-13 MED ORDER — FAMOTIDINE IN NACL 20-0.9 MG/50ML-% IV SOLN
20.0000 mg | Freq: Once | INTRAVENOUS | Status: AC
  Administered 2014-01-13: 20 mg via INTRAVENOUS

## 2014-01-13 MED ORDER — DIPHENHYDRAMINE HCL 50 MG/ML IJ SOLN
INTRAMUSCULAR | Status: AC
  Filled 2014-01-13: qty 1

## 2014-01-13 MED ORDER — ONDANSETRON 16 MG/50ML IVPB (CHCC)
16.0000 mg | Freq: Once | INTRAVENOUS | Status: AC
  Administered 2014-01-13: 16 mg via INTRAVENOUS

## 2014-01-13 MED ORDER — SODIUM CHLORIDE 0.9 % IV SOLN
180.6000 mg | Freq: Once | INTRAVENOUS | Status: AC
  Administered 2014-01-13: 180 mg via INTRAVENOUS
  Filled 2014-01-13: qty 18

## 2014-01-13 MED ORDER — ONDANSETRON 16 MG/50ML IVPB (CHCC)
INTRAVENOUS | Status: AC
  Filled 2014-01-13: qty 16

## 2014-01-13 MED ORDER — SODIUM CHLORIDE 0.9 % IV SOLN
45.0000 mg/m2 | Freq: Once | INTRAVENOUS | Status: AC
  Administered 2014-01-13: 90 mg via INTRAVENOUS
  Filled 2014-01-13: qty 15

## 2014-01-13 MED ORDER — SODIUM CHLORIDE 0.9 % IV SOLN
Freq: Once | INTRAVENOUS | Status: AC
  Administered 2014-01-13: 14:00:00 via INTRAVENOUS

## 2014-01-13 NOTE — Patient Instructions (Signed)
[facility] Discharge Instructions for Patients Receiving Chemotherapy  Today you received the following chemotherapy agents: Taxol, Carboplatin  To help prevent nausea and vomiting after your treatment, we encourage you to take your nausea medication as prescribed.    If you develop nausea and vomiting that is not controlled by your nausea medication, call the clinic.   BELOW ARE SYMPTOMS THAT SHOULD BE REPORTED IMMEDIATELY:  *FEVER GREATER THAN 100.5 F  *CHILLS WITH OR WITHOUT FEVER  NAUSEA AND VOMITING THAT IS NOT CONTROLLED WITH YOUR NAUSEA MEDICATION  *UNUSUAL SHORTNESS OF BREATH  *UNUSUAL BRUISING OR BLEEDING  TENDERNESS IN MOUTH AND THROAT WITH OR WITHOUT PRESENCE OF ULCERS  *URINARY PROBLEMS  *BOWEL PROBLEMS  UNUSUAL RASH Items with * indicate a potential emergency and should be followed up as soon as possible.  Feel free to call the clinic you have any questions or concerns. The clinic phone number is [phone].

## 2014-01-13 NOTE — Progress Notes (Signed)
[facility] [phone]   [phone]  OFFICE PROGRESS NOTE  [name], MD [address] [place] [address]  DIAGNOSIS: Squamous cell lung cancer  Primary site: Lung (Right)  Staging method: AJCC 7th Edition  Clinical: Stage IIIB (T3, N3, M0)  Summary: Stage IIIB (T3, N3, M0)   PRIOR THERAPY: None   CURRENT THERAPY: Concurrent chemoradiation with weekly chemotherapy in the form of carboplatin for AUC of 2 and paclitaxel 45 mg/m2. Status post 4 week of treatment.   CHEMOTHERAPY INTENT: Control/Palliative  CURRENT # OF CHEMOTHERAPY CYCLES: 5 CURRENT ANTIEMETICS: Zofran, dexamethasone, Compazine  CURRENT SMOKING STATUS: Former smoker, quit 12/05/1997  ORAL CHEMOTHERAPY AND CONSENT: n/a  CURRENT BISPHOSPHONATES USE: None  PAIN MANAGEMENT: no cancer related pain, patient does have RA  NARCOTICS INDUCED CONSTIPATION: none  LIVING WILL AND CODE STATUS: ?   INTERVAL HISTORY: Megan [name] 73 y.o. female returns to the clinic today for followup visit. The patient is tolerating her concurrent chemoradiation fairly well with no significant adverse effects except for soreness throat. The patient denied having any significant weight loss or night sweats. She has no nausea or vomiting. She has no fever or chills. She denied having any significant chest pain but continues to have shortness breath with exertion with no cough or hemoptysis.  MEDICAL HISTORY: Past Medical History  Diagnosis Date  . Hypertension   . Diabetes mellitus   . Rheumatoid arthritis   . GERD (gastroesophageal reflux disease)     no meds for    ALLERGIES:  is allergic to remicade.  MEDICATIONS:  Current Outpatient Prescriptions  Medication Sig Dispense Refill  . acetaminophen (TYLENOL) 500 MG tablet Take by mouth every 6 (six) hours as needed.      [name] BIOTIN PO Take 1 tablet by mouth daily.      . calcipotriene-betamethasone (TACLONEX)  ointment Apply 1 application topically daily as needed (rash).       . Cholecalciferol (VITAMIN D PO) Take 1 tablet by mouth daily.      . clotrimazole (LOTRIMIN) 1 % cream Apply 1 application topically daily.       . Coenzyme Q10 (CO Q 10 PO) Take 1 capsule by mouth daily.      . Cyanocobalamin (B-12 PO) Take 1 tablet by mouth daily. 200      . emollient (BIAFINE) cream Apply 1 application topically 2 (two) times daily. Apply to skin chest area bid after rad tx and bedtime daily      . fluticasone (CUTIVATE) 0.05 % cream Apply topically 2 (two) times daily.      [name] HYDROcodone-acetaminophen (HYCET) 7.5-325 mg/15 ml solution Take 5-15 mLs by mouth 4 (four) times daily as needed for moderate pain.  473 mL  0  . losartan-hydrochlorothiazide (HYZAAR) 100-25 MG per tablet Take 1 tablet by mouth daily.      [name] menthol-cetylpyridinium (CEPACOL) 3 MG lozenge Take 1 lozenge by mouth as needed for sore throat.      . metFORMIN (GLUCOPHAGE) 500 MG tablet Take 500 mg by mouth 2 (two) times daily with a meal.      . Nutritional Supplements (ESTROVEN PO) Take 1 tablet by mouth daily.      . potassium chloride SA (K-DUR,KLOR-CON) 20 MEQ tablet Take 1 tablet (20 mEq total) by mouth daily.  7 tablet  0  . prochlorperazine (COMPAZINE) 10 MG tablet Take 1 tablet (10 mg total) by mouth every 6 (six)  hours as needed for nausea or vomiting.  60 tablet  0  . sucralfate (CARAFATE) 1 G tablet Take 1 tablet (1 g total) by mouth 4 (four) times daily -  with meals and at bedtime. 5 min before eating for esophagus irritation  120 tablet  5   No current facility-administered medications for this visit.   Facility-Administered Medications Ordered in Other Visits  Medication Dose Route Frequency Provider Last Rate Last Dose  . sodium chloride 0.9 % injection 10 mL  10 mL Intracatheter PRN Curt Bears, MD        SURGICAL HISTORY:  Past Surgical History  Procedure Laterality Date  . Cholecystectomy    . Abdominal  hysterectomy      REVIEW OF SYSTEMS:  A comprehensive review of systems was negative except for: Ears, nose, mouth, throat, and face: positive for sore throat   PHYSICAL EXAMINATION: General appearance: alert, cooperative and no distress Head: Normocephalic, without obvious abnormality, atraumatic Neck: no adenopathy, no JVD, supple, symmetrical, trachea midline and thyroid not enlarged, symmetric, no tenderness/mass/nodules Lymph nodes: Cervical, supraclavicular, and axillary nodes normal. Resp: clear to auscultation bilaterally Back: symmetric, no curvature. ROM normal. No CVA tenderness. Cardio: regular rate and rhythm, S1, S2 normal, no murmur, click, rub or gallop GI: soft, non-tender; bowel sounds normal; no masses,  no organomegaly Extremities: extremities normal, atraumatic, no cyanosis or edema  ECOG PERFORMANCE STATUS: 1 - Symptomatic but completely ambulatory  Blood pressure 107/46, pulse 102, temperature 97.8 F (36.6 C), temperature source Oral, resp. rate 19, height 5\' 6"  (1.676 m), weight 170 lb 3.2 oz (77.202 kg).  LABORATORY DATA: Lab Results  Component Value Date   WBC 3.6* 01/13/2014   HGB 13.8 01/13/2014   HCT 41.1 01/13/2014   MCV 94.9 01/13/2014   PLT 125* 01/13/2014      Chemistry      Component Value Date/Time   NA 140 01/06/2014 0800   K 3.5 01/06/2014 0800   CO2 27 01/06/2014 0800   BUN 14.5 01/06/2014 0800   CREATININE 0.8 01/06/2014 0800      Component Value Date/Time   CALCIUM 8.9 01/06/2014 0800   ALKPHOS 83 01/06/2014 0800   AST 31 01/06/2014 0800   ALT 63* 01/06/2014 0800   BILITOT 0.49 01/06/2014 0800       RADIOGRAPHIC STUDIES: Mr Jeri Cos AJ Contrast  2014/01/04   CLINICAL DATA:  Squamous cell carcinoma of the lung. Rule out metastatic disease.  EXAM: MRI HEAD WITHOUT AND WITH CONTRAST  TECHNIQUE: Multiplanar, multiecho pulse sequences of the brain and surrounding structures were obtained without and with intravenous contrast.  CONTRAST:  27mL MULTIHANCE  GADOBENATE DIMEGLUMINE 529 MG/ML IV SOLN  COMPARISON:  None.  FINDINGS: Ventricle size is normal. Negative for acute or chronic infarction. Negative for mass or edema. Negative for intracranial hemorrhage.  Postcontrast imaging reveals normal enhancement. No enhancing mass lesion. Calvarium intact.  Paranasal sinuses are clear.  IMPRESSION: No acute abnormality and negative for metastatic disease.   Electronically Signed   By: Franchot Gallo M.D.   On: Jan 04, 2014 16:00    ASSESSMENT AND PLAN: This is a very pleasant 73 years old white female with history of stage IIIB B. non-small cell lung cancer currently undergoing a course of concurrent chemoradiation with weekly carboplatin and paclitaxel is status post 4 cycles. The patient is tolerating her treatment fairly well with no significant adverse effects except for the radiation induced esophagitis. I recommended for her to continue her current treatment  as scheduled. She would come back for followup visit in 2 weeks for reevaluation.  She was advised to call immediately she has any concerning symptoms in the interval. The patient voices understanding of current disease status and treatment options and is in agreement with the current care plan.  All questions were answered. The patient knows to call the clinic with any problems, questions or concerns. We can certainly see the patient much sooner if necessary.   Disclaimer: This note was dictated with voice recognition software. Similar sounding words can inadvertently be transcribed and may not be corrected upon review.

## 2014-01-14 ENCOUNTER — Telehealth: Payer: Self-pay | Admitting: Internal Medicine

## 2014-01-14 ENCOUNTER — Ambulatory Visit
Admission: RE | Admit: 2014-01-14 | Discharge: 2014-01-14 | Disposition: A | Discharge status: Home / Self Care | Payer: Medicare Other | Source: Ambulatory Visit | Attending: Radiation Oncology | Admitting: Radiation Oncology

## 2014-01-14 NOTE — Telephone Encounter (Signed)
sw. pt and advised on 3.30.15 appt with MD visit...pt ok and aware

## 2014-01-15 ENCOUNTER — Ambulatory Visit
Admission: RE | Admit: 2014-01-15 | Discharge: 2014-01-15 | Disposition: A | Discharge status: Home / Self Care | Payer: Medicare Other | Source: Ambulatory Visit | Attending: Radiation Oncology | Admitting: Radiation Oncology

## 2014-01-16 ENCOUNTER — Ambulatory Visit: Admission: RE | Admit: 2014-01-16 | Payer: Medicare Other | Source: Ambulatory Visit | Admitting: Radiation Oncology

## 2014-01-16 ENCOUNTER — Ambulatory Visit
Admission: RE | Admit: 2014-01-16 | Discharge: 2014-01-16 | Disposition: A | Discharge status: Home / Self Care | Payer: Medicare Other | Source: Ambulatory Visit | Attending: Radiation Oncology | Admitting: Radiation Oncology

## 2014-01-16 NOTE — Progress Notes (Addendum)
Patient came to nursing c/o sore throat, difficulty swallowing,  And this morning had blood clot come through my nose, better has stopped, went to treatment stated she felt better after lyng on the table for treatment, c/o constipation as well, only using carafate 3x day 5 min before eating, encouraged her to take 4x day, last right before bedtime, and to try and drink fluids and take mira lax, or something comperable, she stated she would take milk of magnesia at home, will see MD tomorrow after her treatment chest erythema skin intact,uses biafine bid 3:02 PM

## 2014-01-17 ENCOUNTER — Ambulatory Visit
Admission: RE | Admit: 2014-01-17 | Discharge: 2014-01-17 | Disposition: A | Discharge status: Home / Self Care | Payer: Medicare Other | Source: Ambulatory Visit | Attending: Radiation Oncology | Admitting: Radiation Oncology

## 2014-01-17 VITALS — BP 105/58 | HR 113 | Ht 66.0 in | Wt 165.8 lb

## 2014-01-17 DIAGNOSIS — C3490 Malignant neoplasm of unspecified part of unspecified bronchus or lung: Secondary | ICD-10-CM

## 2014-01-17 DIAGNOSIS — C342 Malignant neoplasm of middle lobe, bronchus or lung: Secondary | ICD-10-CM | POA: Insufficient documentation

## 2014-01-17 MED ORDER — BIAFINE EX EMUL
Freq: Two times a day (BID) | CUTANEOUS | Status: DC
  Administered 2014-01-17: 16:00:00 via TOPICAL

## 2014-01-17 NOTE — Addendum Note (Signed)
Encounter addended by: Marye Round, MD on: 01/17/2014  3:50 PM<BR>     Documentation filed: Notes Section, Visit Diagnoses

## 2014-01-17 NOTE — Progress Notes (Signed)
Megan [name] had had 24 fractions to her right lung.  She is having pain in her esophagus with swallowing that she is rating a at a 4/10.  She is taking carafate and hycet.  She reports her swallowing is better today.  She has lost 5 lbs since 3/16.  Orthostatic vitals done: bp sitting: 101/62, hr 101, bp standing: 105/58, hr 113.  A dietician appointment will be made for her.  She reports having a cough and nose bleeds everyday. She reports shortness of breath with acitivty today.  Her oxygen saturation on room air is 98%.   She last had chemotherapy Monday.  She reports that she is starting to loose her hair.  The skin on her chest and upper back is red and dry.  She is using biafine and has requested a refill.

## 2014-01-17 NOTE — Addendum Note (Signed)
Encounter addended by: Jacqulyn Liner, RN on: 01/17/2014  3:55 PM<BR>     Documentation filed: Inpatient MAR

## 2014-01-17 NOTE — Progress Notes (Addendum)
  Radiation Oncology         [phone] ________________________________  Name: Megan [name] MRN: [id number]  Date: 12/06/2013  DOB: May 15, 1941  SIMULATION AND TREATMENT PLANNING NOTE  DIAGNOSIS:  Non-small cell lung cancer  Site:  Right middle lobe plus regional lymphadenopathy  NARRATIVE:  The patient was brought to the [facility].  Identity was confirmed.  All relevant records and images related to the planned course of therapy were reviewed.   Written consent to proceed with treatment was confirmed which was freely given after reviewing the details related to the planned course of therapy had been reviewed with the patient.  Then, the patient was set-up in a stable reproducible  supine position for radiation therapy.  CT images were obtained.  Surface markings were placed.    Medically necessary complex treatment device(s) for immobilization:  Customized VAC lock bag.   The CT images were loaded into the planning software.  Then the target and avoidance structures were contoured.  Treatment planning then occurred.  The radiation prescription was entered and confirmed.   I have requested : Intensity Modulated Radiotherapy (IMRT) is medically necessary for this case for the following reason:  Due to a complex lung target and regional lymph nodes, a IMRT technique will be necessary to adequately deliver radiation dose to the target while sparing the critical normal structures most prominently the lungs and spinal cord which are in close proximity.  The patient will undergo daily image guidance to ensure accurate localization of the target, and adequate minimize dose to the normal surrounding structures in close proximity to the target.   PLAN:  The patient will initially receive 60 Gy in 30 fractions.    Special treatment procedure The patient will also receive concurrent chemotherapy during the treatment. The patient may therefore experience increased toxicity or  side effects and the patient will be monitored for such problems. This may require extra lab work as necessary. This therefore constitutes a special treatment procedure.       ________________________________   Jodelle Gross, MD, PhD

## 2014-01-17 NOTE — Progress Notes (Signed)
Department of Radiation Oncology  Phone:  [phone] Fax:        [phone]  Weekly Treatment Note    Name: [name] Date: 01/17/2014 MRN: [id number] DOB: 22-Jun-1941   Current dose: 48 Gy  Current fraction: 24   MEDICATIONS: Current Outpatient Prescriptions  Medication Sig Dispense Refill  . BIOTIN PO Take 1 tablet by mouth daily.      . Cholecalciferol (VITAMIN D PO) Take 1 tablet by mouth daily.      . Coenzyme Q10 (CO Q 10 PO) Take 1 capsule by mouth daily.      . Cyanocobalamin (B-12 PO) Take 1 tablet by mouth daily. 200      . emollient (BIAFINE) cream Apply 1 application topically 2 (two) times daily. Apply to skin chest area bid after rad tx and bedtime daily      . HYDROcodone-acetaminophen (HYCET) 7.5-325 mg/15 ml solution Take 5-15 mLs by mouth 4 (four) times daily as needed for moderate pain.  473 mL  0  . ibuprofen (ADVIL,MOTRIN) 200 MG tablet Take 200 mg by mouth every 6 (six) hours as needed.      [name] losartan-hydrochlorothiazide (HYZAAR) 100-25 MG per tablet Take 1 tablet by mouth daily.      [name] menthol-cetylpyridinium (CEPACOL) 3 MG lozenge Take 1 lozenge by mouth as needed for sore throat.      . metFORMIN (GLUCOPHAGE) 500 MG tablet Take 500 mg by mouth 2 (two) times daily with a meal.      . prochlorperazine (COMPAZINE) 10 MG tablet Take 1 tablet (10 mg total) by mouth every 6 (six) hours as needed for nausea or vomiting.  60 tablet  0  . sucralfate (CARAFATE) 1 G tablet Take 1 tablet (1 g total) by mouth 4 (four) times daily -  with meals and at bedtime. 5 min before eating for esophagus irritation  120 tablet  5  . acetaminophen (TYLENOL) 500 MG tablet Take by mouth every 6 (six) hours as needed.      . calcipotriene-betamethasone (TACLONEX) ointment Apply 1 application topically daily as needed (rash).       . clotrimazole (LOTRIMIN) 1 % cream Apply 1 application topically daily.       . fluticasone (CUTIVATE) 0.05 % cream Apply topically 2 (two)  times daily.      . Nutritional Supplements (ESTROVEN PO) Take 1 tablet by mouth daily.      . potassium chloride SA (K-DUR,KLOR-CON) 20 MEQ tablet Take 1 tablet (20 mEq total) by mouth daily.  7 tablet  0   No current facility-administered medications for this encounter.   Facility-Administered Medications Ordered in Other Encounters  Medication Dose Route Frequency Provider Last Rate Last Dose  . sodium chloride 0.9 % injection 10 mL  10 mL Intracatheter PRN Curt Bears, MD         ALLERGIES: Remicade   LABORATORY DATA:  Lab Results  Component Value Date   WBC 3.6* 01/13/2014   HGB 13.8 01/13/2014   HCT 41.1 01/13/2014   MCV 94.9 01/13/2014   PLT 125* 01/13/2014   Lab Results  Component Value Date   NA 138 01/13/2014   K 3.4* 01/13/2014   CO2 26 01/13/2014   Lab Results  Component Value Date   ALT 48 01/13/2014   AST 25 01/13/2014   ALKPHOS 74 01/13/2014   BILITOT 0.71 01/13/2014     NARRATIVE: Megan [name] was seen today for weekly treatment management. The chart was checked  and the patient's films were reviewed. The patient states she continues to have some esophagitis. She is using Carafate and liquid pain medication and is swallowing a little better. She has lost some weight and this has been discussed with her. She will see the dietitian.  PHYSICAL EXAMINATION: height is 5\' 6"  (1.676 m) and weight is 165 lb 12.8 oz (75.206 kg). Her blood pressure is 105/58 and her pulse is 113. Her oxygen saturation is 98%.        ASSESSMENT: The patient is doing satisfactorily with treatment.  PLAN: We will continue with the patient's radiation treatment as planned.

## 2014-01-20 ENCOUNTER — Other Ambulatory Visit (HOSPITAL_BASED_OUTPATIENT_CLINIC_OR_DEPARTMENT_OTHER): Payer: Medicare Other

## 2014-01-20 ENCOUNTER — Other Ambulatory Visit: Payer: Self-pay | Admitting: Internal Medicine

## 2014-01-20 ENCOUNTER — Ambulatory Visit (HOSPITAL_BASED_OUTPATIENT_CLINIC_OR_DEPARTMENT_OTHER): Payer: Medicare Other

## 2014-01-20 ENCOUNTER — Ambulatory Visit
Admission: RE | Admit: 2014-01-20 | Discharge: 2014-01-20 | Disposition: A | Discharge status: Home / Self Care | Payer: Medicare Other | Source: Ambulatory Visit | Attending: Radiation Oncology | Admitting: Radiation Oncology

## 2014-01-20 VITALS — BP 101/57 | HR 72 | Temp 97.8°F | Resp 18

## 2014-01-20 DIAGNOSIS — Z5111 Encounter for antineoplastic chemotherapy: Secondary | ICD-10-CM

## 2014-01-20 DIAGNOSIS — C3490 Malignant neoplasm of unspecified part of unspecified bronchus or lung: Secondary | ICD-10-CM

## 2014-01-20 DIAGNOSIS — C770 Secondary and unspecified malignant neoplasm of lymph nodes of head, face and neck: Secondary | ICD-10-CM

## 2014-01-20 DIAGNOSIS — C342 Malignant neoplasm of middle lobe, bronchus or lung: Secondary | ICD-10-CM

## 2014-01-20 LAB — CBC WITH DIFFERENTIAL/PLATELET
BASO%: 0.6 % (ref 0.0–2.0)
Basophils Absolute: 0 10*3/uL (ref 0.0–0.1)
EOS ABS: 0.1 10*3/uL (ref 0.0–0.5)
EOS%: 1.2 % (ref 0.0–7.0)
HCT: 39.7 % (ref 34.8–46.6)
HGB: 13.6 g/dL (ref 11.6–15.9)
LYMPH#: 0.3 10*3/uL — AB (ref 0.9–3.3)
LYMPH%: 5.6 % — AB (ref 14.0–49.7)
MCH: 31.5 pg (ref 25.1–34.0)
MCHC: 34.3 g/dL (ref 31.5–36.0)
MCV: 91.9 fL (ref 79.5–101.0)
MONO#: 0.4 10*3/uL (ref 0.1–0.9)
MONO%: 7.9 % (ref 0.0–14.0)
NEUT#: 4.4 10*3/uL (ref 1.5–6.5)
NEUT%: 84.7 % — ABNORMAL HIGH (ref 38.4–76.8)
Platelets: 162 10*3/uL (ref 145–400)
RBC: 4.32 10*6/uL (ref 3.70–5.45)
RDW: 14.7 % — ABNORMAL HIGH (ref 11.2–14.5)
WBC: 5.2 10*3/uL (ref 3.9–10.3)

## 2014-01-20 LAB — COMPREHENSIVE METABOLIC PANEL (CC13)
ALBUMIN: 3.4 g/dL — AB (ref 3.5–5.0)
ALT: 54 U/L (ref 0–55)
AST: 32 U/L (ref 5–34)
Alkaline Phosphatase: 87 U/L (ref 40–150)
Anion Gap: 15 mEq/L — ABNORMAL HIGH (ref 3–11)
BILIRUBIN TOTAL: 0.62 mg/dL (ref 0.20–1.20)
BUN: 18.9 mg/dL (ref 7.0–26.0)
CALCIUM: 9.6 mg/dL (ref 8.4–10.4)
CHLORIDE: 100 meq/L (ref 98–109)
CO2: 27 meq/L (ref 22–29)
Creatinine: 1.1 mg/dL (ref 0.6–1.1)
GLUCOSE: 207 mg/dL — AB (ref 70–140)
POTASSIUM: 3.1 meq/L — AB (ref 3.5–5.1)
SODIUM: 141 meq/L (ref 136–145)
Total Protein: 6.7 g/dL (ref 6.4–8.3)

## 2014-01-20 LAB — TECHNOLOGIST REVIEW

## 2014-01-20 MED ORDER — CARBOPLATIN CHEMO INJECTION 450 MG/45ML
180.6000 mg | Freq: Once | INTRAVENOUS | Status: AC
  Administered 2014-01-20: 180 mg via INTRAVENOUS
  Filled 2014-01-20: qty 18

## 2014-01-20 MED ORDER — ONDANSETRON 16 MG/50ML IVPB (CHCC)
INTRAVENOUS | Status: AC
  Filled 2014-01-20: qty 16

## 2014-01-20 MED ORDER — DEXAMETHASONE SODIUM PHOSPHATE 20 MG/5ML IJ SOLN
20.0000 mg | Freq: Once | INTRAMUSCULAR | Status: AC
  Administered 2014-01-20: 20 mg via INTRAVENOUS

## 2014-01-20 MED ORDER — SODIUM CHLORIDE 0.9 % IV SOLN
45.0000 mg/m2 | Freq: Once | INTRAVENOUS | Status: AC
  Administered 2014-01-20: 90 mg via INTRAVENOUS
  Filled 2014-01-20: qty 15

## 2014-01-20 MED ORDER — FAMOTIDINE IN NACL 20-0.9 MG/50ML-% IV SOLN
INTRAVENOUS | Status: AC
  Filled 2014-01-20: qty 50

## 2014-01-20 MED ORDER — DIPHENHYDRAMINE HCL 50 MG/ML IJ SOLN
50.0000 mg | Freq: Once | INTRAMUSCULAR | Status: AC
  Administered 2014-01-20: 50 mg via INTRAVENOUS

## 2014-01-20 MED ORDER — DEXAMETHASONE SODIUM PHOSPHATE 20 MG/5ML IJ SOLN
INTRAMUSCULAR | Status: AC
  Filled 2014-01-20: qty 5

## 2014-01-20 MED ORDER — FAMOTIDINE IN NACL 20-0.9 MG/50ML-% IV SOLN
20.0000 mg | Freq: Once | INTRAVENOUS | Status: AC
  Administered 2014-01-20: 20 mg via INTRAVENOUS

## 2014-01-20 MED ORDER — SODIUM CHLORIDE 0.9 % IV SOLN
Freq: Once | INTRAVENOUS | Status: AC
  Administered 2014-01-20: 09:00:00 via INTRAVENOUS

## 2014-01-20 MED ORDER — ONDANSETRON 16 MG/50ML IVPB (CHCC)
16.0000 mg | Freq: Once | INTRAVENOUS | Status: AC
  Administered 2014-01-20: 16 mg via INTRAVENOUS

## 2014-01-20 MED ORDER — DIPHENHYDRAMINE HCL 50 MG/ML IJ SOLN
INTRAMUSCULAR | Status: AC
  Filled 2014-01-20: qty 1

## 2014-01-20 NOTE — Progress Notes (Signed)
Quick Note:  Call patient with the result and order K Dur 20 meq po qd X 7 days ______

## 2014-01-20 NOTE — Patient Instructions (Signed)
[facility] Discharge Instructions for Patients Receiving Chemotherapy  Today you received the following chemotherapy agents Taxol and Carboplatin.  To help prevent nausea and vomiting after your treatment, we encourage you to take your nausea medication.   If you develop nausea and vomiting that is not controlled by your nausea medication, call the clinic.   BELOW ARE SYMPTOMS THAT SHOULD BE REPORTED IMMEDIATELY:  *FEVER GREATER THAN 100.5 F  *CHILLS WITH OR WITHOUT FEVER  NAUSEA AND VOMITING THAT IS NOT CONTROLLED WITH YOUR NAUSEA MEDICATION  *UNUSUAL SHORTNESS OF BREATH  *UNUSUAL BRUISING OR BLEEDING  TENDERNESS IN MOUTH AND THROAT WITH OR WITHOUT PRESENCE OF ULCERS  *URINARY PROBLEMS  *BOWEL PROBLEMS  UNUSUAL RASH Items with * indicate a potential emergency and should be followed up as soon as possible.  Feel free to call the clinic you have any questions or concerns. The clinic phone number is [phone].    

## 2014-01-21 ENCOUNTER — Telehealth: Payer: Self-pay | Admitting: Medical Oncology

## 2014-01-21 ENCOUNTER — Ambulatory Visit
Admission: RE | Admit: 2014-01-21 | Discharge: 2014-01-21 | Disposition: A | Discharge status: Home / Self Care | Payer: Medicare Other | Source: Ambulatory Visit | Attending: Radiation Oncology | Admitting: Radiation Oncology

## 2014-01-21 DIAGNOSIS — E876 Hypokalemia: Secondary | ICD-10-CM

## 2014-01-21 MED ORDER — POTASSIUM CHLORIDE CRYS ER 20 MEQ PO TBCR: 20.0000 meq | EXTENDED_RELEASE_TABLET | Freq: Every day | ORAL | Status: DC

## 2014-01-21 NOTE — Telephone Encounter (Signed)
rx sent to pharmacy and pt notified.

## 2014-01-21 NOTE — Telephone Encounter (Signed)
Message copied by Ardeen Garland on Tue Jan 21, 2014  9:08 AM ------      Message from: Curt Bears      Created: Mon Jan 20, 2014  9:45 PM       Call patient with the result and order K Dur 20 meq po qd X 7 days ------

## 2014-01-22 ENCOUNTER — Ambulatory Visit
Admission: RE | Admit: 2014-01-22 | Discharge: 2014-01-22 | Disposition: A | Discharge status: Home / Self Care | Payer: Medicare Other | Source: Ambulatory Visit | Attending: Radiation Oncology | Admitting: Radiation Oncology

## 2014-01-23 ENCOUNTER — Ambulatory Visit: Payer: Medicare Other | Admitting: Nutrition

## 2014-01-23 ENCOUNTER — Ambulatory Visit
Admission: RE | Admit: 2014-01-23 | Discharge: 2014-01-23 | Disposition: A | Discharge status: Home / Self Care | Payer: Medicare Other | Source: Ambulatory Visit | Attending: Radiation Oncology | Admitting: Radiation Oncology

## 2014-01-23 NOTE — Progress Notes (Signed)
73 year old female diagnosed with stage III lung cancer.  She is a patient of Dr. Earlie Server and Dr. Lisbeth Renshaw.  Past medical history includes hypertension, diabetes, rheumatoid arthritis, and GERD.  Medications include vitamin D, CoQ10, vitamin B12, Glucophage, Compazine, and Carafate.  Labs include potassium 3.1, glucose 207, albumin 3.4 on March 23.  Height: 66 inches. Weight: 164.2 pounds March 26. Usual body weight: 176 pounds January 2015. BMI: 26.5.  Patient reports esophagitis secondary to radiation therapy.  She has 4 radiation treatments left.  She is swallowing some better.  She does report diarrhea for which she takes Kaopectate.  Noted potassium is decreased.  She will have continuation of weekly chemotherapy.  Patient is concerned regarding weight loss.  Nutrition diagnosis: Unintended weight loss related to diagnosis of lung cancer and associated treatments as evidenced by 7% weight loss from usual body weight.  Intervention: Patient was educated on strategies for increasing meals and snacks throughout the day and adding high-protein foods.  Patient was encouraged to maintain present weight.  Also educated patient on strategies for improving diarrhea.  Gently reinforced No concentrated sweets diet for blood sugar control.  Encouraged patient to try Carnation breakfast essentials for additional calories and protein.  Questions were answered.  Teach back method used.  Contact information was provided.  Monitoring, evaluation, goals: Patient will tolerate adequate calories and protein for weight maintenance.  Next visit: No followup scheduled at this time.

## 2014-01-24 ENCOUNTER — Ambulatory Visit
Admission: RE | Admit: 2014-01-24 | Discharge: 2014-01-24 | Disposition: A | Discharge status: Home / Self Care | Payer: Medicare Other | Source: Ambulatory Visit | Attending: Radiation Oncology | Admitting: Radiation Oncology

## 2014-01-24 ENCOUNTER — Encounter: Payer: Self-pay | Admitting: Radiation Oncology

## 2014-01-24 VITALS — BP 128/75 | HR 92 | Temp 97.7°F | Ht 66.0 in | Wt 165.1 lb

## 2014-01-24 DIAGNOSIS — C342 Malignant neoplasm of middle lobe, bronchus or lung: Secondary | ICD-10-CM

## 2014-01-24 NOTE — Progress Notes (Signed)
  Radiation Oncology         [phone] ________________________________  Name: Megan [name] MRN: [id number]  Date: 01/24/2014  DOB: Oct 01, 1941  COMPLEX SIMULATION  NOTE  Diagnosis: lung cancer  Narrative The patient has undergone a complex simulation for the patient's upcoming boost treatment.   Radiation dose prior to boost: 60 Gy  Boost dose to the high risk target:  6 Gy, to be delivered in 3 fractions  To accomplish the boost treatment, a 3-D tomotherapy plan has been designed to treat the boost clinical target volume. The final sinogram will determine the medically necessary number of complex treatment devices that will be used for the boost treatment A complex isodose plan is requested to ensure that the high-risk target region receives the appropriate radiation dose and that the nearby normal structures continue to be appropriately spared. Dose volume histograms for the cumulative plan will be reviewed for the relevant structures.   Total dose after boost:  66 Gy    ________________________________   Jodelle Gross, MD, PhD

## 2014-01-24 NOTE — Progress Notes (Signed)
Megan [name] has had 29 fractions to her right lung.  She is having pain due to a sore throat.  She is taking carafate and hycet about 3 times a day.  She reports that she had trouble swallowing a potassium pill that was stuck in her throat.  She also reports trouble swallowing solid food.  She denies shortness of breath and hemoptysis but does have occasional nose bleeds.  Had chemotherapy last Monday.  Her weight is stable this week.  She reports diarrhea from chemo and is taking imodium and kaopectate.  She says it is worse in the mornings.  The skin on her upper back is red and the skin on her right chest is red with a scabbed area.  She is using biafine and antibiotic ointment to the scabbed area.  She reports fatigue.

## 2014-01-24 NOTE — Progress Notes (Signed)
Department of Radiation Oncology  Phone:  [phone] Fax:        [phone]  Weekly Treatment Note    Name: Megan [name] Date: 01/24/2014 MRN: [id number] DOB: September 15, 1941   Current dose: 58 Gy  Current fraction: 29   MEDICATIONS: Current Outpatient Prescriptions  Medication Sig Dispense Refill  . acetaminophen (TYLENOL) 500 MG tablet Take by mouth every 6 (six) hours as needed.      [name] BIOTIN PO Take 1 tablet by mouth daily.      . Cholecalciferol (VITAMIN D PO) Take 1 tablet by mouth daily.      . clotrimazole (LOTRIMIN) 1 % cream Apply 1 application topically daily.       . Coenzyme Q10 (CO Q 10 PO) Take 1 capsule by mouth daily.      . Cyanocobalamin (B-12 PO) Take 1 tablet by mouth daily. 200      . emollient (BIAFINE) cream Apply 1 application topically 2 (two) times daily. Apply to skin chest area bid after rad tx and bedtime daily      . fluticasone (CUTIVATE) 0.05 % cream Apply topically 2 (two) times daily.      [name] HYDROcodone-acetaminophen (HYCET) 7.5-325 mg/15 ml solution Take 5-15 mLs by mouth 4 (four) times daily as needed for moderate pain.  473 mL  0  . ibuprofen (ADVIL,MOTRIN) 200 MG tablet Take 200 mg by mouth every 6 (six) hours as needed.      [name] losartan-hydrochlorothiazide (HYZAAR) 100-25 MG per tablet Take 1 tablet by mouth daily.      [name] menthol-cetylpyridinium (CEPACOL) 3 MG lozenge Take 1 lozenge by mouth as needed for sore throat.      . metFORMIN (GLUCOPHAGE) 500 MG tablet Take 500 mg by mouth 2 (two) times daily with a meal.      . Nutritional Supplements (ESTROVEN PO) Take 1 tablet by mouth daily.      . potassium chloride SA (K-DUR,KLOR-CON) 20 MEQ tablet Take 1 tablet (20 mEq total) by mouth daily.  7 tablet  0  . prochlorperazine (COMPAZINE) 10 MG tablet Take 1 tablet (10 mg total) by mouth every 6 (six) hours as needed for nausea or vomiting.  60 tablet  0  . sucralfate (CARAFATE) 1 G tablet Take 1 tablet (1 g total) by mouth 4 (four)  times daily -  with meals and at bedtime. 5 min before eating for esophagus irritation  120 tablet  5  . calcipotriene-betamethasone (TACLONEX) ointment Apply 1 application topically daily as needed (rash).        No current facility-administered medications for this encounter.   Facility-Administered Medications Ordered in Other Encounters  Medication Dose Route Frequency Provider Last Rate Last Dose  . sodium chloride 0.9 % injection 10 mL  10 mL Intracatheter PRN Curt Bears, MD         ALLERGIES: Remicade   LABORATORY DATA:  Lab Results  Component Value Date   WBC 5.2 01/20/2014   HGB 13.6 01/20/2014   HCT 39.7 01/20/2014   MCV 91.9 01/20/2014   PLT 162 01/20/2014   Lab Results  Component Value Date   NA 141 01/20/2014   K 3.1* 01/20/2014   CO2 27 01/20/2014   Lab Results  Component Value Date   ALT 54 01/20/2014   AST 32 01/20/2014   ALKPHOS 87 01/20/2014   BILITOT 0.62 01/20/2014     NARRATIVE: Megan [name] was seen today for weekly treatment management. The chart was checked  and the patient's films were reviewed. The patient is doing relatively well. She does have continued esophagitis/sore throat. This is not markedly changed. She is taking Carafate and pain medication for this.  PHYSICAL EXAMINATION: height is 5\' 6"  (1.676 m) and weight is 165 lb 1.6 oz (74.889 kg). Her temperature is 97.7 F (36.5 C). Her blood pressure is 128/75 and her pulse is 92. Her oxygen saturation is 98%.      radiation dermatitis is present in the lower neck and chest region. She has applied skin cream after treatment today.  ASSESSMENT: The patient is doing satisfactorily with treatment.  PLAN: We will continue with the patient's radiation treatment as planned. The patient is nearing the completion of her treatment. She will finish next week and I will see her one month after completion. We will continue her current management for esophagitis and her skin care regimen.

## 2014-01-27 ENCOUNTER — Ambulatory Visit: Payer: Medicare Other

## 2014-01-27 ENCOUNTER — Ambulatory Visit (HOSPITAL_BASED_OUTPATIENT_CLINIC_OR_DEPARTMENT_OTHER): Payer: Medicare Other

## 2014-01-27 ENCOUNTER — Other Ambulatory Visit: Payer: Medicare Other

## 2014-01-27 ENCOUNTER — Other Ambulatory Visit (HOSPITAL_BASED_OUTPATIENT_CLINIC_OR_DEPARTMENT_OTHER): Payer: Medicare Other

## 2014-01-27 ENCOUNTER — Encounter: Payer: Self-pay | Admitting: Physician Assistant

## 2014-01-27 ENCOUNTER — Ambulatory Visit
Admission: RE | Admit: 2014-01-27 | Discharge: 2014-01-27 | Disposition: A | Discharge status: Home / Self Care | Payer: Medicare Other | Source: Ambulatory Visit | Attending: Radiation Oncology | Admitting: Radiation Oncology

## 2014-01-27 ENCOUNTER — Ambulatory Visit (HOSPITAL_BASED_OUTPATIENT_CLINIC_OR_DEPARTMENT_OTHER): Payer: Medicare Other | Admitting: Physician Assistant

## 2014-01-27 ENCOUNTER — Telehealth: Payer: Self-pay | Admitting: Internal Medicine

## 2014-01-27 VITALS — BP 118/81 | HR 100 | Temp 97.9°F | Resp 20 | Ht 66.0 in | Wt 161.2 lb

## 2014-01-27 DIAGNOSIS — C3490 Malignant neoplasm of unspecified part of unspecified bronchus or lung: Secondary | ICD-10-CM

## 2014-01-27 DIAGNOSIS — L988 Other specified disorders of the skin and subcutaneous tissue: Secondary | ICD-10-CM

## 2014-01-27 DIAGNOSIS — R070 Pain in throat: Secondary | ICD-10-CM

## 2014-01-27 DIAGNOSIS — C342 Malignant neoplasm of middle lobe, bronchus or lung: Secondary | ICD-10-CM

## 2014-01-27 DIAGNOSIS — K208 Other esophagitis without bleeding: Secondary | ICD-10-CM

## 2014-01-27 DIAGNOSIS — C770 Secondary and unspecified malignant neoplasm of lymph nodes of head, face and neck: Secondary | ICD-10-CM

## 2014-01-27 DIAGNOSIS — Y842 Radiological procedure and radiotherapy as the cause of abnormal reaction of the patient, or of later complication, without mention of misadventure at the time of the procedure: Secondary | ICD-10-CM

## 2014-01-27 DIAGNOSIS — Z5111 Encounter for antineoplastic chemotherapy: Secondary | ICD-10-CM

## 2014-01-27 DIAGNOSIS — R1310 Dysphagia, unspecified: Secondary | ICD-10-CM

## 2014-01-27 LAB — CBC WITH DIFFERENTIAL/PLATELET
BASO%: 0.6 % (ref 0.0–2.0)
Basophils Absolute: 0 10*3/uL (ref 0.0–0.1)
EOS%: 0.4 % (ref 0.0–7.0)
Eosinophils Absolute: 0 10*3/uL (ref 0.0–0.5)
HCT: 38.5 % (ref 34.8–46.6)
HGB: 13.2 g/dL (ref 11.6–15.9)
LYMPH#: 0.2 10*3/uL — AB (ref 0.9–3.3)
LYMPH%: 3.7 % — ABNORMAL LOW (ref 14.0–49.7)
MCH: 32 pg (ref 25.1–34.0)
MCHC: 34.3 g/dL (ref 31.5–36.0)
MCV: 93.4 fL (ref 79.5–101.0)
MONO#: 0.6 10*3/uL (ref 0.1–0.9)
MONO%: 12.4 % (ref 0.0–14.0)
NEUT#: 4.1 10*3/uL (ref 1.5–6.5)
NEUT%: 82.9 % — AB (ref 38.4–76.8)
Platelets: 176 10*3/uL (ref 145–400)
RBC: 4.12 10*6/uL (ref 3.70–5.45)
RDW: 15.4 % — ABNORMAL HIGH (ref 11.2–14.5)
WBC: 4.9 10*3/uL (ref 3.9–10.3)
nRBC: 0 % (ref 0–0)

## 2014-01-27 LAB — COMPREHENSIVE METABOLIC PANEL (CC13)
ALBUMIN: 3.4 g/dL — AB (ref 3.5–5.0)
ALK PHOS: 91 U/L (ref 40–150)
ALT: 47 U/L (ref 0–55)
AST: 25 U/L (ref 5–34)
Anion Gap: 14 mEq/L — ABNORMAL HIGH (ref 3–11)
BILIRUBIN TOTAL: 0.76 mg/dL (ref 0.20–1.20)
BUN: 11.5 mg/dL (ref 7.0–26.0)
CO2: 24 mEq/L (ref 22–29)
Calcium: 9.7 mg/dL (ref 8.4–10.4)
Chloride: 102 mEq/L (ref 98–109)
Creatinine: 0.9 mg/dL (ref 0.6–1.1)
Glucose: 216 mg/dl — ABNORMAL HIGH (ref 70–140)
Potassium: 3.4 mEq/L — ABNORMAL LOW (ref 3.5–5.1)
Sodium: 140 mEq/L (ref 136–145)
Total Protein: 6.7 g/dL (ref 6.4–8.3)

## 2014-01-27 LAB — TECHNOLOGIST REVIEW

## 2014-01-27 MED ORDER — SODIUM CHLORIDE 0.9 % IV SOLN
180.0000 mg | Freq: Once | INTRAVENOUS | Status: AC
  Administered 2014-01-27: 180 mg via INTRAVENOUS
  Filled 2014-01-27: qty 18

## 2014-01-27 MED ORDER — FAMOTIDINE IN NACL 20-0.9 MG/50ML-% IV SOLN
INTRAVENOUS | Status: AC
  Filled 2014-01-27: qty 50

## 2014-01-27 MED ORDER — DIPHENHYDRAMINE HCL 50 MG/ML IJ SOLN
INTRAMUSCULAR | Status: AC
  Filled 2014-01-27: qty 1

## 2014-01-27 MED ORDER — FAMOTIDINE IN NACL 20-0.9 MG/50ML-% IV SOLN
20.0000 mg | Freq: Once | INTRAVENOUS | Status: AC
  Administered 2014-01-27: 20 mg via INTRAVENOUS

## 2014-01-27 MED ORDER — ONDANSETRON 16 MG/50ML IVPB (CHCC)
16.0000 mg | Freq: Once | INTRAVENOUS | Status: AC
  Administered 2014-01-27: 16 mg via INTRAVENOUS

## 2014-01-27 MED ORDER — ONDANSETRON 16 MG/50ML IVPB (CHCC)
INTRAVENOUS | Status: AC
  Filled 2014-01-27: qty 16

## 2014-01-27 MED ORDER — SODIUM CHLORIDE 0.9 % IV SOLN
Freq: Once | INTRAVENOUS | Status: AC
  Administered 2014-01-27: 10:00:00 via INTRAVENOUS

## 2014-01-27 MED ORDER — DEXAMETHASONE SODIUM PHOSPHATE 20 MG/5ML IJ SOLN
INTRAMUSCULAR | Status: AC
  Filled 2014-01-27: qty 5

## 2014-01-27 MED ORDER — PROCHLORPERAZINE MALEATE 10 MG PO TABS: 10.0000 mg | ORAL_TABLET | Freq: Four times a day (QID) | ORAL | Status: DC | PRN

## 2014-01-27 MED ORDER — DIPHENHYDRAMINE HCL 50 MG/ML IJ SOLN
50.0000 mg | Freq: Once | INTRAMUSCULAR | Status: AC
  Administered 2014-01-27: 50 mg via INTRAVENOUS

## 2014-01-27 MED ORDER — DEXAMETHASONE SODIUM PHOSPHATE 20 MG/5ML IJ SOLN
20.0000 mg | Freq: Once | INTRAMUSCULAR | Status: AC
  Administered 2014-01-27: 20 mg via INTRAVENOUS

## 2014-01-27 MED ORDER — SODIUM CHLORIDE 0.9 % IV SOLN
45.0000 mg/m2 | Freq: Once | INTRAVENOUS | Status: AC
  Administered 2014-01-27: 90 mg via INTRAVENOUS
  Filled 2014-01-27: qty 15

## 2014-01-27 NOTE — Patient Instructions (Signed)
Complete her course of concurrent chemoradiation as scheduled Followup Dr. Julien Nordmann in one month with a restaging CT scan of your chest reevaluate your disease

## 2014-01-27 NOTE — Telephone Encounter (Signed)
gv adn printed aptp sched for April....pt changed 4.20.15 time...done

## 2014-01-27 NOTE — Progress Notes (Addendum)
[place] [phone]   [phone]  SHARED VISIT PROGRESS NOTE  Megan Bolt, MD [place] [address]  DIAGNOSIS: Squamous cell lung cancer  Primary site: Lung (Right)  Staging method: AJCC 7th Edition  Clinical: Stage IIIB (T3, N3, M0)  Summary: Stage IIIB (T3, N3, M0)   PRIOR THERAPY: None   CURRENT THERAPY: Concurrent chemoradiation with weekly chemotherapy in the form of carboplatin for AUC of 2 and paclitaxel 45 mg/m2. Status post 6 week of treatment.   CHEMOTHERAPY INTENT: Control/Palliative  CURRENT # OF CHEMOTHERAPY CYCLES: 7 CURRENT ANTIEMETICS: Zofran, dexamethasone, Compazine  CURRENT SMOKING STATUS: Former smoker, quit 12/05/1997  ORAL CHEMOTHERAPY AND CONSENT: n/a  CURRENT BISPHOSPHONATES USE: None  PAIN MANAGEMENT: no cancer related pain, patient does have RA  NARCOTICS INDUCED CONSTIPATION: none  LIVING WILL AND CODE STATUS: ?   INTERVAL HISTORY: Megan [name] 73 y.o. female returns to the clinic today for followup visit accompanied by her husband. The patient is tolerating her concurrent chemoradiation fairly well with no significant adverse effects except for dysphasia and sore throat. She is also having some skin issues related to the radiation therapy. She is applying the prescribed cream as instructed. The patient denied having any significant weight loss or night sweats. She reports some nausea or vomiting that is well managed with her Compazine tablets. She requests a refill for the Compazine. She has no fever or chills. She denied having any significant chest pain but continues to have shortness breath with exertion with no cough or hemoptysis. The cough is occasionally productive of some Green secretions. She reports MAXIMUM TEMPERATURE of 99.8. She reports that her Humira was discontinued 11/10/2013. She was on Humira for her rheumatoid arthritis. She was interested in  getting a "shingles vaccine". Megan [name] is her rheumatologist. She reports some diarrhea now down to about 3 episodes per day. She is using Imodium however not exactly according to the instructions. Patient was also advised to avoid dairy while she is experiencing the diarrhea and to follow the instructions on the Imodium box.  MEDICAL HISTORY: Past Medical History  Diagnosis Date  . Hypertension   . Diabetes mellitus   . Rheumatoid arthritis   . GERD (gastroesophageal reflux disease)     no meds for    ALLERGIES:  is allergic to remicade.  MEDICATIONS:  Current Outpatient Prescriptions  Medication Sig Dispense Refill  . BIOTIN PO Take 1 tablet by mouth daily.      . calcipotriene-betamethasone (TACLONEX) ointment Apply 1 application topically daily as needed (rash).       . Cholecalciferol (VITAMIN D PO) Take 1 tablet by mouth daily.      . clotrimazole (LOTRIMIN) 1 % cream Apply 1 application topically daily.       . Coenzyme Q10 (CO Q 10 PO) Take 1 capsule by mouth daily.      . Cyanocobalamin (B-12 PO) Take 1 tablet by mouth daily. 200      . emollient (BIAFINE) cream Apply 1 application topically 2 (two) times daily. Apply to skin chest area bid after rad tx and bedtime daily      . fluticasone (CUTIVATE) 0.05 % cream Apply topically 2 (two) times daily.      [name] HYDROcodone-acetaminophen (HYCET) 7.5-325 mg/15 ml solution Take 5-15 mLs by mouth 4 (four) times daily as needed for moderate pain.  473 mL  0  . ibuprofen (ADVIL,MOTRIN) 200 MG tablet Take  200 mg by mouth every 6 (six) hours as needed.      [name] losartan-hydrochlorothiazide (HYZAAR) 100-25 MG per tablet Take 1 tablet by mouth daily.      [name] menthol-cetylpyridinium (CEPACOL) 3 MG lozenge Take 1 lozenge by mouth as needed for sore throat.      . metFORMIN (GLUCOPHAGE) 500 MG tablet Take 500 mg by mouth 2 (two) times daily with a meal.      . potassium chloride SA (K-DUR,KLOR-CON) 20 MEQ tablet Take 1 tablet (20 mEq total) by  mouth daily.  7 tablet  0  . prochlorperazine (COMPAZINE) 10 MG tablet Take 1 tablet (10 mg total) by mouth every 6 (six) hours as needed for nausea or vomiting.  60 tablet  0  . sucralfate (CARAFATE) 1 G tablet Take 1 tablet (1 g total) by mouth 4 (four) times daily -  with meals and at bedtime. 5 min before eating for esophagus irritation  120 tablet  5  . Nutritional Supplements (ESTROVEN PO) Take 1 tablet by mouth daily.       No current facility-administered medications for this visit.   Facility-Administered Medications Ordered in Other Visits  Medication Dose Route Frequency Provider Last Rate Last Dose  . CARBOplatin (PARAPLATIN) 180 mg in sodium chloride 0.9 % 100 mL chemo infusion  180 mg Intravenous Once Megan Bears, MD      . famotidine (PEPCID) IVPB 20 mg  20 mg Intravenous Once Megan Bears, MD   20 mg at 01/27/14 1003  . PACLitaxel (TAXOL) 90 mg in sodium chloride 0.9 % 250 mL chemo infusion (</= 80mg /m2)  45 mg/m2 (Treatment Plan Actual) Intravenous Once Megan Bears, MD      . sodium chloride 0.9 % injection 10 mL  10 mL Intracatheter PRN Megan Bears, MD        SURGICAL HISTORY:  Past Surgical History  Procedure Laterality Date  . Cholecystectomy    . Abdominal hysterectomy      REVIEW OF SYSTEMS:  A comprehensive review of systems was negative except for: Ears, nose, mouth, throat, and face: positive for sore throat   PHYSICAL EXAMINATION: General appearance: alert, cooperative and no distress Head: Normocephalic, without obvious abnormality, atraumatic Neck: no adenopathy, no JVD, supple, symmetrical, trachea midline and thyroid not enlarged, symmetric, no tenderness/mass/nodules Lymph nodes: Cervical, supraclavicular, and axillary nodes normal. Resp: clear to auscultation bilaterally Back: symmetric, no curvature. ROM normal. No CVA tenderness. Cardio: regular rate and rhythm, S1, S2 normal, no murmur, click, rub or gallop GI: soft, non-tender; bowel  sounds normal; no masses,  no organomegaly Extremities: extremities normal, atraumatic, no cyanosis or edema Skin: Left infraclavicular region with erythematous skin with some areas of blistering and open wound area. There is minimal serous sanguinous drainage, no evidence of superinfection. The mid back also has some erythematous radiation skin changes without skin breakdown  ECOG PERFORMANCE STATUS: 1 - Symptomatic but completely ambulatory  Blood pressure 118/81, pulse 100, temperature 97.9 F (36.6 C), temperature source Oral, resp. rate 20, height 5\' 6"  (1.676 m), weight 161 lb 3.2 oz (73.12 kg), SpO2 100.00%.  LABORATORY DATA: Lab Results  Component Value Date   WBC 4.9 01/27/2014   HGB 13.2 01/27/2014   HCT 38.5 01/27/2014   MCV 93.4 01/27/2014   PLT 176 01/27/2014      Chemistry      Component Value Date/Time   NA 140 01/27/2014 0821   K 3.4* 01/27/2014 0821   CO2 24 01/27/2014 5409  BUN 11.5 01/27/2014 0821   CREATININE 0.9 01/27/2014 0821      Component Value Date/Time   CALCIUM 9.7 01/27/2014 0821   ALKPHOS 91 01/27/2014 0821   AST 25 01/27/2014 0821   ALT 47 01/27/2014 0821   BILITOT 0.76 01/27/2014 4193       RADIOGRAPHIC STUDIES: Mr Jeri Cos Wo Contrast  12-31-2013   CLINICAL DATA:  Squamous cell carcinoma of the lung. Rule out metastatic disease.  EXAM: MRI HEAD WITHOUT AND WITH CONTRAST  TECHNIQUE: Multiplanar, multiecho pulse sequences of the brain and surrounding structures were obtained without and with intravenous contrast.  CONTRAST:  32mL MULTIHANCE GADOBENATE DIMEGLUMINE 529 MG/ML IV SOLN  COMPARISON:  None.  FINDINGS: Ventricle size is normal. Negative for acute or chronic infarction. Negative for mass or edema. Negative for intracranial hemorrhage.  Postcontrast imaging reveals normal enhancement. No enhancing mass lesion. Calvarium intact.  Paranasal sinuses are clear.  IMPRESSION: No acute abnormality and negative for metastatic disease.   Electronically Signed    By: Franchot Gallo M.D.   On: December 31, 2013 16:00    ASSESSMENT AND PLAN: This is a very pleasant 73 years old white female with history of stage IIIB B. non-small cell lung cancer currently undergoing a course of concurrent chemoradiation with weekly carboplatin and paclitaxel is status post 6 cycles. The patient is tolerating her treatment fairly well with no significant adverse effects except for the radiation induced esophagitis and radiation skin changes. Patient was discussed with and also seen by Dr. Julien [name]. For the diarrhea patient is to adjust her diet to avoid dairy as well as to continue with Imodium. She will followup with Dr. Lisbeth [name] regarding her radiation skin changes and dysphasia. A refill for her Compazine was sent her pharmacy of record via E. scribed. Patient was informed that the shingles vaccine is a live vaccine and therefore not recommended in her current immunocompromised state. She'll complete her course of concurrent chemoradiation as scheduled. She'll follow with Dr. Julien [name] in one month with a restaging CT scan of her chest with contrast to reevaluate her disease.  She was advised to call immediately she has any concerning symptoms in the interval. The patient voices understanding of current disease status and treatment options and is in agreement with the current care plan.  All questions were answered. The patient knows to call the clinic with any problems, questions or concerns. We can certainly see the patient much sooner if necessary.  Megan Adam PA-C  ADDENDUM: Hematology/Oncology Attending: I had a face to face encounter with the patient today. I recommended her care plan. This is a very pleasant 73 years old white female recently diagnosed with a stage IIIB non-small cell lung cancer and currently undergoing concurrent chemoradiation with weekly carboplatin and paclitaxel is status post 6 weeks of treatment. The patient expected to complete this course of  chemoradiation later this week. She is tolerating her treatment fairly well except for skin burns from the radiation as well as radiation induced esophagitis. She was advised to consult with radiation oncology regarding the treatment for the skin burns. She would continue treatment with Carafate and pain medication for the radiation induced esophagitis. The patient would come back for followup visit in one month for reevaluation after repeating CT scan of the chest. She was advised to call immediately she has any concerning symptoms in the interval.  Disclaimer: This note was dictated with voice recognition software. Similar sounding words can inadvertently be transcribed and may not be  corrected upon review. Eilleen Kempf., MD 01/27/2014

## 2014-01-28 ENCOUNTER — Telehealth: Payer: Self-pay | Admitting: Internal Medicine

## 2014-01-28 ENCOUNTER — Ambulatory Visit
Admission: RE | Admit: 2014-01-28 | Discharge: 2014-01-28 | Disposition: A | Discharge status: Home / Self Care | Payer: Medicare Other | Source: Ambulatory Visit | Attending: Radiation Oncology | Admitting: Radiation Oncology

## 2014-01-28 ENCOUNTER — Ambulatory Visit: Payer: Medicare Other

## 2014-01-28 NOTE — Telephone Encounter (Signed)
s.w. pt and advised on cx appts and May appts....pt ok and aware

## 2014-01-29 ENCOUNTER — Ambulatory Visit
Admission: RE | Admit: 2014-01-29 | Discharge: 2014-01-29 | Disposition: A | Discharge status: Home / Self Care | Payer: Medicare Other | Source: Ambulatory Visit | Attending: Radiation Oncology | Admitting: Radiation Oncology

## 2014-01-29 ENCOUNTER — Ambulatory Visit: Payer: Medicare Other | Admitting: Radiation Oncology

## 2014-01-29 VITALS — BP 103/55 | HR 93 | Temp 98.2°F | Wt 166.0 lb

## 2014-01-29 DIAGNOSIS — C342 Malignant neoplasm of middle lobe, bronchus or lung: Secondary | ICD-10-CM

## 2014-01-29 DIAGNOSIS — C3490 Malignant neoplasm of unspecified part of unspecified bronchus or lung: Secondary | ICD-10-CM

## 2014-01-29 MED ORDER — BIAFINE EX EMUL: Freq: Two times a day (BID) | CUTANEOUS | Status: DC

## 2014-01-29 MED ORDER — BIAFINE EX EMUL
CUTANEOUS | Status: DC | PRN
  Administered 2014-01-29: 1 via TOPICAL

## 2014-01-29 NOTE — Progress Notes (Signed)
Patient for weekly assessment of radiation to right lung.completed 32 of 33 treatments.Skin is red anterior ly and posteriorly.applying biafine at least twice daily.took imodium ad this morning as has been having diarrhea for the last week.Informed radiation not causative factor and questioned chemotherapy but after further discussion and review of medications informed patient it could be metformin which she has been on for 6 months with januvia samples being added over the last 6 days for total of 3 week regime.Applied the biafine to back.Will give additional tube and follow up card to see Dr.Moody in 1 month.

## 2014-01-29 NOTE — Progress Notes (Signed)
   Weekly Management Note:  Outpatient Current Dose:  64 Gy  Projected Dose: 66 Gy   Narrative:  The patient presents for routine under treatment assessment.  CBCT/MVCT images/Port film x-rays were reviewed.  The chart was checked. She denies any acute issues related to RT. She declines speaking with me for more than a minute due to being in a rush for a bridge match.    Physical Findings:  weight is 166 lb (75.297 kg). Her temperature is 98.2 F (36.8 C). Her blood pressure is 103/55 and her pulse is 93. Her oxygen saturation is 100%.  Physical exam declined.  CBC    Component Value Date/Time   WBC 4.9 01/27/2014 0821   WBC 9.0 11/21/2013 1312   RBC 4.12 01/27/2014 0821   RBC 4.52 11/21/2013 1312   HGB 13.2 01/27/2014 0821   HGB 14.6 11/21/2013 1312   HCT 38.5 01/27/2014 0821   HCT 42.3 11/21/2013 1312   PLT 176 01/27/2014 0821   PLT 204 11/21/2013 1312   MCV 93.4 01/27/2014 0821   MCV 93.6 11/21/2013 1312   MCH 32.0 01/27/2014 0821   MCH 32.3 11/21/2013 1312   MCHC 34.3 01/27/2014 0821   MCHC 34.5 11/21/2013 1312   RDW 15.4* 01/27/2014 0821   RDW 14.9 11/21/2013 1312   LYMPHSABS 0.2* 01/27/2014 0821   MONOABS 0.6 01/27/2014 0821   EOSABS 0.0 01/27/2014 0821   BASOSABS 0.0 01/27/2014 0821     CMP     Component Value Date/Time   NA 140 01/27/2014 0821   K 3.4* 01/27/2014 0821   CO2 24 01/27/2014 0821   GLUCOSE 216* 01/27/2014 0821   BUN 11.5 01/27/2014 0821   CREATININE 0.9 01/27/2014 0821   CALCIUM 9.7 01/27/2014 0821   PROT 6.7 01/27/2014 0821   ALBUMIN 3.4* 01/27/2014 0821   AST 25 01/27/2014 0821   ALT 47 01/27/2014 0821   ALKPHOS 91 01/27/2014 0821   BILITOT 0.76 01/27/2014 0821     Impression:  The patient is tolerating radiotherapy.   Plan:  Continue radiotherapy as planned. 1 mo f/u will be ordered.  -----------------------------------  Eppie Gibson, MD

## 2014-01-30 ENCOUNTER — Ambulatory Visit
Admission: RE | Admit: 2014-01-30 | Discharge: 2014-01-30 | Disposition: A | Discharge status: Home / Self Care | Payer: Medicare Other | Source: Ambulatory Visit | Attending: Radiation Oncology | Admitting: Radiation Oncology

## 2014-01-30 ENCOUNTER — Encounter: Payer: Self-pay | Admitting: Radiation Oncology

## 2014-02-03 ENCOUNTER — Other Ambulatory Visit: Payer: Medicare Other

## 2014-02-06 NOTE — Addendum Note (Signed)
Encounter addended by: Marye Round, MD on: 02/06/2014  7:30 PM<BR>     Documentation filed: Notes Section

## 2014-02-06 NOTE — Progress Notes (Signed)
  Radiation Oncology         [phone] ________________________________  Name: Megan [name] MRN: [id number]  Date: 01/30/2014  DOB: Jul 06, 1941  End of Treatment Note  Diagnosis:   Non-small cell lung cancer     Indication for treatment:  Curative       Radiation treatment dates:   12/16/2013 through 01/30/2014  Site/dose:   The patient was treated to the thoracic disease within the chest as well as regional lymphadenopathy. She received a initial treatment to a dose of 60 gray in 30 fractions using IMRT with daily image guidance.   the patient then received a boost for an additional 6 gray. The final dose was 66 gray.   Narrative: The patient tolerated radiation treatment relatively well.   The patient experienced some side effects towards the end of treatment including some skin irritation and esophagitis. Overall she did relatively well and was able to complete her prescribed course of treatment without substantial delay.   Plan: The patient has completed radiation treatment. The patient will return to radiation oncology clinic for routine followup in one month. I advised the patient to call or return sooner if they have any questions or concerns related to their recovery or treatment. ________________________________  Jodelle Gross, M.D., Ph.D.

## 2014-02-10 ENCOUNTER — Other Ambulatory Visit: Payer: Medicare Other

## 2014-02-13 ENCOUNTER — Telehealth: Payer: Self-pay | Admitting: *Deleted

## 2014-02-13 ENCOUNTER — Other Ambulatory Visit: Payer: Self-pay | Admitting: Radiation Oncology

## 2014-02-13 MED ORDER — BENZONATATE 200 MG PO CAPS: 200.0000 mg | ORAL_CAPSULE | Freq: Three times a day (TID) | ORAL | Status: DC | PRN

## 2014-02-13 NOTE — Telephone Encounter (Signed)
hycet rx signed by MD you can pick up anytime, patient said  To thank Dr. Lisbeth Renshaw and me she will be here in 10-15 minutes  3:45 PM

## 2014-02-13 NOTE — Telephone Encounter (Signed)
Called, the patient asked if she thought her cough medication was Tussinex, she said it started with a T",   So I called the pharmacy at  [facility] asked what cough medication was prescribed back in December was Tussinex, patient had thought it started with a T, spoke with Suezanne Jacquet the Pharmacist, last written in December , will call Dr.Moody to see if he will refill this and call patient back  11:18 AM

## 2014-02-13 NOTE — Telephone Encounter (Signed)
error 

## 2014-02-17 ENCOUNTER — Other Ambulatory Visit: Payer: Medicare Other

## 2014-02-24 ENCOUNTER — Other Ambulatory Visit: Payer: Medicare Other

## 2014-02-28 ENCOUNTER — Ambulatory Visit (HOSPITAL_COMMUNITY)
Admission: RE | Admit: 2014-02-28 | Discharge: 2014-02-28 | Disposition: A | Discharge status: Home / Self Care | Payer: Medicare Other | Source: Ambulatory Visit | Attending: Physician Assistant | Admitting: Physician Assistant

## 2014-02-28 ENCOUNTER — Encounter: Payer: Self-pay | Admitting: Radiation Oncology

## 2014-02-28 ENCOUNTER — Other Ambulatory Visit (HOSPITAL_BASED_OUTPATIENT_CLINIC_OR_DEPARTMENT_OTHER): Payer: Medicare Other

## 2014-02-28 ENCOUNTER — Telehealth: Payer: Self-pay | Admitting: *Deleted

## 2014-02-28 DIAGNOSIS — Z923 Personal history of irradiation: Secondary | ICD-10-CM | POA: Insufficient documentation

## 2014-02-28 DIAGNOSIS — C3490 Malignant neoplasm of unspecified part of unspecified bronchus or lung: Secondary | ICD-10-CM

## 2014-02-28 DIAGNOSIS — C770 Secondary and unspecified malignant neoplasm of lymph nodes of head, face and neck: Secondary | ICD-10-CM

## 2014-02-28 DIAGNOSIS — E876 Hypokalemia: Secondary | ICD-10-CM

## 2014-02-28 DIAGNOSIS — C342 Malignant neoplasm of middle lobe, bronchus or lung: Secondary | ICD-10-CM

## 2014-02-28 DIAGNOSIS — Z9221 Personal history of antineoplastic chemotherapy: Secondary | ICD-10-CM | POA: Insufficient documentation

## 2014-02-28 DIAGNOSIS — C3410 Malignant neoplasm of upper lobe, unspecified bronchus or lung: Secondary | ICD-10-CM | POA: Insufficient documentation

## 2014-02-28 LAB — COMPREHENSIVE METABOLIC PANEL (CC13)
ALT: 28 U/L (ref 0–55)
ANION GAP: 14 meq/L — AB (ref 3–11)
AST: 22 U/L (ref 5–34)
Albumin: 3.3 g/dL — ABNORMAL LOW (ref 3.5–5.0)
Alkaline Phosphatase: 89 U/L (ref 40–150)
BUN: 13.7 mg/dL (ref 7.0–26.0)
CALCIUM: 10.4 mg/dL (ref 8.4–10.4)
CHLORIDE: 101 meq/L (ref 98–109)
CO2: 26 meq/L (ref 22–29)
Creatinine: 1 mg/dL (ref 0.6–1.1)
Glucose: 201 mg/dl — ABNORMAL HIGH (ref 70–140)
Potassium: 2.8 mEq/L — CL (ref 3.5–5.1)
Sodium: 141 mEq/L (ref 136–145)
Total Bilirubin: 0.56 mg/dL (ref 0.20–1.20)
Total Protein: 6.5 g/dL (ref 6.4–8.3)

## 2014-02-28 LAB — CBC WITH DIFFERENTIAL/PLATELET
BASO%: 1 % (ref 0.0–2.0)
BASOS ABS: 0.1 10*3/uL (ref 0.0–0.1)
EOS%: 3.3 % (ref 0.0–7.0)
Eosinophils Absolute: 0.2 10*3/uL (ref 0.0–0.5)
HEMATOCRIT: 32.5 % — AB (ref 34.8–46.6)
HGB: 11 g/dL — ABNORMAL LOW (ref 11.6–15.9)
LYMPH#: 0.6 10*3/uL — AB (ref 0.9–3.3)
LYMPH%: 10.4 % — AB (ref 14.0–49.7)
MCH: 33.2 pg (ref 25.1–34.0)
MCHC: 34 g/dL (ref 31.5–36.0)
MCV: 97.8 fL (ref 79.5–101.0)
MONO#: 0.5 10*3/uL (ref 0.1–0.9)
MONO%: 9.2 % (ref 0.0–14.0)
NEUT#: 4.3 10*3/uL (ref 1.5–6.5)
NEUT%: 76.1 % (ref 38.4–76.8)
PLATELETS: 212 10*3/uL (ref 145–400)
RBC: 3.32 10*6/uL — ABNORMAL LOW (ref 3.70–5.45)
RDW: 18.1 % — ABNORMAL HIGH (ref 11.2–14.5)
WBC: 5.7 10*3/uL (ref 3.9–10.3)

## 2014-02-28 MED ORDER — POTASSIUM CHLORIDE CRYS ER 20 MEQ PO TBCR: 40.0000 meq | EXTENDED_RELEASE_TABLET | Freq: Every day | ORAL | Status: DC

## 2014-02-28 MED ORDER — IOHEXOL 300 MG/ML  SOLN
80.0000 mL | Freq: Once | INTRAMUSCULAR | Status: AC | PRN
  Administered 2014-02-28: 80 mL via INTRAVENOUS

## 2014-02-28 NOTE — Telephone Encounter (Signed)
Called patient with lab results and informed that a prescription has been sent to her pharmacy. Per Awilda Metro, PA.  Patient stated she could not swallow pill and wanted to know if there was something else she could take.  Spoke with Adrena.  Informed patient that she could crush pill and put in applesauce.  Per Awilda Metro.  Patient verbalized understanding.

## 2014-02-28 NOTE — Telephone Encounter (Signed)
Left message for patient to call back regarding lab results.  Sent a prescription for potassium to her pharmacy per Awilda Metro, PA.

## 2014-03-03 ENCOUNTER — Telehealth: Payer: Self-pay | Admitting: *Deleted

## 2014-03-03 ENCOUNTER — Telehealth: Payer: Self-pay | Admitting: Internal Medicine

## 2014-03-03 ENCOUNTER — Encounter: Payer: Self-pay | Admitting: Internal Medicine

## 2014-03-03 ENCOUNTER — Ambulatory Visit (HOSPITAL_BASED_OUTPATIENT_CLINIC_OR_DEPARTMENT_OTHER): Payer: Medicare Other | Admitting: Internal Medicine

## 2014-03-03 VITALS — BP 125/64 | HR 93 | Temp 97.6°F | Resp 18 | Ht 66.0 in | Wt 157.0 lb

## 2014-03-03 DIAGNOSIS — C342 Malignant neoplasm of middle lobe, bronchus or lung: Secondary | ICD-10-CM

## 2014-03-03 DIAGNOSIS — C770 Secondary and unspecified malignant neoplasm of lymph nodes of head, face and neck: Secondary | ICD-10-CM

## 2014-03-03 DIAGNOSIS — C3491 Malignant neoplasm of unspecified part of right bronchus or lung: Secondary | ICD-10-CM

## 2014-03-03 NOTE — Progress Notes (Signed)
[place] [phone]   [phone]  OFFICE PROGRESS NOTE  [name], MD [address] [place] [address]  DIAGNOSIS: Squamous cell lung cancer  Primary site: Lung (Right)  Staging method: AJCC 7th Edition  Clinical: Stage IIIB (T3, N3, M0)  Summary: Stage IIIB (T3, N3, M0)   PRIOR THERAPY: Concurrent chemoradiation with weekly chemotherapy in the form of carboplatin for AUC of 2 and paclitaxel 45 mg/m2. Status post 7 week of treatment.  Last dose was given 01/27/2014 no significant response in her disease.  CURRENT THERAPY: Consolidation chemotherapy with carboplatin for AUC of 5 and paclitaxel 175 mg/M2 every 3 weeks with Neulasta support. First dose on 03/17/2014.  CHEMOTHERAPY INTENT: Control/Palliative  CURRENT # OF CHEMOTHERAPY CYCLES: 1 CURRENT ANTIEMETICS: Zofran, dexamethasone, Compazine  CURRENT SMOKING STATUS: Former smoker, quit 12/05/1997  ORAL CHEMOTHERAPY AND CONSENT: n/a  CURRENT BISPHOSPHONATES USE: None  PAIN MANAGEMENT: no cancer related pain, patient does have RA  NARCOTICS INDUCED CONSTIPATION: none  LIVING WILL AND CODE STATUS: ?   INTERVAL HISTORY: Megan [name] 73 y.o. female returns to the clinic today for followup visit accompanied by a friend. The patient tolerated the previous course of concurrent chemoradiation fairly well with no significant adverse effects except for soreness throat and odynophagia which is slowly improving. The patient denied having any significant weight loss or night sweats. She has no nausea or vomiting. She has no fever or chills. She denied having any significant chest pain but continues to have shortness breath with exertion with no cough or hemoptysis. She had repeat CT scan of the chest performed recently and she is here for evaluation and discussion of her scan results.  MEDICAL HISTORY: Past Medical History  Diagnosis Date  . Hypertension   .  Diabetes mellitus   . Rheumatoid arthritis   . GERD (gastroesophageal reflux disease)     no meds for  . Hx of radiation therapy 12/16/13-01/30/14    lung 66Gy    ALLERGIES:  is allergic to remicade.  MEDICATIONS:  Current Outpatient Prescriptions  Medication Sig Dispense Refill  . ibuprofen (ADVIL,MOTRIN) 200 MG tablet Take 200 mg by mouth every 6 (six) hours as needed.      [name] losartan-hydrochlorothiazide (HYZAAR) 100-25 MG per tablet Take 1 tablet by mouth daily.      . potassium chloride SA (K-DUR,KLOR-CON) 20 MEQ tablet Take 2 tablets (40 mEq total) by mouth daily. For 7 days  14 tablet  0  . prochlorperazine (COMPAZINE) 10 MG tablet Take 1 tablet (10 mg total) by mouth every 6 (six) hours as needed for nausea or vomiting.  60 tablet  0  . benzonatate (TESSALON) 200 MG capsule Take 1 capsule (200 mg total) by mouth 3 (three) times daily as needed for cough.  40 capsule  1  . BIOTIN PO Take 1 tablet by mouth daily.      . calcipotriene-betamethasone (TACLONEX) ointment Apply 1 application topically daily as needed (rash).       . Cholecalciferol (VITAMIN D PO) Take 1 tablet by mouth daily.      . clotrimazole (LOTRIMIN) 1 % cream Apply 1 application topically daily.       . Coenzyme Q10 (CO Q 10 PO) Take 1 capsule by mouth daily.      . Cyanocobalamin (B-12 PO) Take 1 tablet by mouth daily. 200      . emollient (BIAFINE) cream Apply 1 application topically 2 (two)  times daily. Apply to skin chest area bid after rad tx and bedtime daily      . fluticasone (CUTIVATE) 0.05 % cream Apply topically 2 (two) times daily.      [name] HYDROcodone-acetaminophen (HYCET) 7.5-325 mg/15 ml solution Take 5-15 mLs by mouth 4 (four) times daily as needed for moderate pain.  473 mL  0  . menthol-cetylpyridinium (CEPACOL) 3 MG lozenge Take 1 lozenge by mouth as needed for sore throat.      . metFORMIN (GLUCOPHAGE) 500 MG tablet Take 500 mg by mouth 2 (two) times daily with a meal.      . Nutritional Supplements  (ESTROVEN PO) Take 1 tablet by mouth daily.      . sitaGLIPtin (JANUVIA) 100 MG tablet Take 100 mg by mouth daily. Started [place] about 6 days ago and therapy to continue for 3 weeks.      . sucralfate (CARAFATE) 1 G tablet Take 1 tablet (1 g total) by mouth 4 (four) times daily -  with meals and at bedtime. 5 min before eating for esophagus irritation  120 tablet  5   No current facility-administered medications for this visit.   Facility-Administered Medications Ordered in Other Visits  Medication Dose Route Frequency Provider Last Rate Last Dose  . sodium chloride 0.9 % injection 10 mL  10 mL Intracatheter PRN Curt Bears, MD        SURGICAL HISTORY:  Past Surgical History  Procedure Laterality Date  . Cholecystectomy    . Abdominal hysterectomy      REVIEW OF SYSTEMS:  A comprehensive review of systems was negative except for: Ears, nose, mouth, throat, and face: positive for sore throat   PHYSICAL EXAMINATION: General appearance: alert, cooperative and no distress Head: Normocephalic, without obvious abnormality, atraumatic Neck: no adenopathy, no JVD, supple, symmetrical, trachea midline and thyroid not enlarged, symmetric, no tenderness/mass/nodules Lymph nodes: Cervical, supraclavicular, and axillary nodes normal. Resp: clear to auscultation bilaterally Back: symmetric, no curvature. ROM normal. No CVA tenderness. Cardio: regular rate and rhythm, S1, S2 normal, no murmur, click, rub or gallop GI: soft, non-tender; bowel sounds normal; no masses,  no organomegaly Extremities: extremities normal, atraumatic, no cyanosis or edema  ECOG PERFORMANCE STATUS: 1 - Symptomatic but completely ambulatory  Blood pressure 125/64, pulse 93, temperature 97.6 F (36.4 C), resp. rate 18, height 5\' 6"  (1.676 m), weight 157 lb (71.215 kg), SpO2 99.00%.  LABORATORY DATA: Lab Results  Component Value Date   WBC 5.7 02/28/2014   HGB 11.0* 02/28/2014   HCT 32.5* 02/28/2014   MCV 97.8  02/28/2014   PLT 212 02/28/2014      Chemistry      Component Value Date/Time   NA 141 02/28/2014 0843   K 2.8* 02/28/2014 0843   CO2 26 02/28/2014 0843   BUN 13.7 02/28/2014 0843   CREATININE 1.0 02/28/2014 0843      Component Value Date/Time   CALCIUM 10.4 02/28/2014 0843   ALKPHOS 89 02/28/2014 0843   AST 22 02/28/2014 0843   ALT 28 02/28/2014 0843   BILITOT 0.56 02/28/2014 0843       RADIOGRAPHIC STUDIES: Ct Chest W Contrast  02/28/2014   CLINICAL DATA:  Restaging non-small cell lung cancer in the right lung diagnosed December 2012. Prior chemotherapy and radiation.  EXAM: CT CHEST WITH CONTRAST  TECHNIQUE: Multidetector CT imaging of the chest was performed during intravenous contrast administration.  CONTRAST:  21mL OMNIPAQUE IOHEXOL 300 MG/ML  SOLN  COMPARISON:  Chest CT  10/25/2013.  PET CT 11/14/2013.  FINDINGS: The right upper lobe mass anteriorly has decreased significantly in size, measuring 2.9 x 2.7 cm on image 24 of series 2. This previously measured 6.5 x 5.5 cm. Bandlike atelectasis noted medial to the mass adjacent to the mediastinum. Scattered small adjacent clustered nodules have also improved.  Ground-glass density noted in the right upper lobe more superiorly measures 13 mm on image 15 and is stable. Calcified granuloma noted in the left lower lobe. Small subpleural nodule along the minor fissure on image 26 measures 5 mm, stable.  Clustered nodular densities are noted in the right lower lobe, new since prior study, likely inflammatory. Recommend attention to these areas on followup imaging. No pleural effusions or pericardial effusion.  Heart is normal size. Aorta is normal caliber. No mediastinal, hilar, or axillary adenopathy.  Left thyroid nodule measures 2.1 cm, stable. Chest wall soft tissues are normal.  Imaging into the upper abdomen demonstrates diffuse fatty infiltration of the liver. 1.5 cm hypodensity noted anteriorly on image 53 was not definitively imaged on the prior study. More  superiorly in the liver, there is a 10 mm hypodensity on image 45, stable. These likely reflect cysts. Adrenal glands are normal.  No acute bony abnormality or focal bone lesion.  IMPRESSION: Significant decrease in the size of the anterior right upper lobe mass. Adjacent small clustered nodules have also decreased in size.  Subtle ground-glass density more superiorly in the right upper lobe measures 13 mm and is stable. Recommend attention on followup imaging.  New clustered nodular densities in the right lower lobe likely are inflammatory or related to treatment. Recommend attention on followup imaging.   Electronically Signed   By: Rolm Baptise M.D.   On: 02/28/2014 10:39   ASSESSMENT AND PLAN: This is a very pleasant 73 years old white female with history of stage IIIB non-small cell lung cancer status post a course of concurrent chemoradiation with weekly carboplatin and paclitaxel is status post 7 cycles. The patient tolerated her treatment fairly well with no significant adverse effects except for the radiation induced esophagitis.  Her recent scan showed significant improvement in her disease. I discussed the scan results and showed the images to the patient and her friend. I gave her the option of continuous observation and close monitoring versus proceeding with 3 cycles of consolidation chemotherapy with carboplatin for AUC of 5 and paclitaxel 175 mg/M2 every 3 weeks with Neulasta support. The patient is interested in proceeding with consolidation chemotherapy. I discussed with her the adverse effect including but not limited to alopecia, myelosuppression, nausea and vomiting, peripheral neuropathy, liver or renal dysfunction. She is expected to start the first cycle of this treatment on 03/17/2014. She would come back for followup visit in 3 weeks for reevaluation.  She was advised to call immediately if she has any concerning symptoms in the interval. The patient voices understanding of current  disease status and treatment options and is in agreement with the current care plan.  All questions were answered. The patient knows to call the clinic with any problems, questions or concerns. We can certainly see the patient much sooner if necessary. I spent 15 minutes of face-to-face counseling with the patient and her friend out of the total visit time 25 minutes.  Disclaimer: This note was dictated with voice recognition software. Similar sounding words can inadvertently be transcribed and may not be corrected upon review.

## 2014-03-03 NOTE — Telephone Encounter (Signed)
Mailed appt to pt today

## 2014-03-03 NOTE — Telephone Encounter (Signed)
Per staff message and POF I have scheduled appts. Advised scheduler to move lab appt JMW

## 2014-03-03 NOTE — Telephone Encounter (Signed)
Gave pt appt for lab and MD for MAy, emailed Sharyn Lull regarding chemo

## 2014-03-03 NOTE — Telephone Encounter (Signed)
called pt and left message regarding appt for May 2015

## 2014-03-05 ENCOUNTER — Ambulatory Visit
Admission: RE | Admit: 2014-03-05 | Discharge: 2014-03-05 | Disposition: A | Discharge status: Home / Self Care | Payer: Medicare Other | Source: Ambulatory Visit | Attending: Radiation Oncology | Admitting: Radiation Oncology

## 2014-03-05 DIAGNOSIS — C3410 Malignant neoplasm of upper lobe, unspecified bronchus or lung: Secondary | ICD-10-CM

## 2014-03-05 DIAGNOSIS — C342 Malignant neoplasm of middle lobe, bronchus or lung: Secondary | ICD-10-CM

## 2014-03-05 HISTORY — DX: Personal history of irradiation: Z92.3

## 2014-03-05 NOTE — Progress Notes (Signed)
Radiation Oncology         [phone] ________________________________  Name: Megan [name] MRN: [id number]  Date: 03/05/2014  DOB: [birth year]  Follow-Up Visit Note  CC: [name], MD  Brand Males, MD  Diagnosis:    Squamous cell lung cancer   Primary site: Lung (Right)   Staging method: AJCC 7th Edition   Clinical: Stage IIIB (T3, N3, M0)   Summary: Stage IIIB (T3, N3, M0)   Narrative:  The patient returns today for first follow-up after completing radiation treatment.  Seen patient has done well since finishing treatment. No significant difficulties in terms of shortness of breath or chest pain. Esophagitis has been improving. The patient has undergone repeat CT imaging and has discussed these results with medical oncology.                              ALLERGIES:  is allergic to remicade.  Meds: Current Outpatient Prescriptions  Medication Sig Dispense Refill  . losartan-hydrochlorothiazide (HYZAAR) 100-25 MG per tablet Take 1 tablet by mouth daily.      . potassium chloride SA (K-DUR,KLOR-CON) 20 MEQ tablet Take 2 tablets (40 mEq total) by mouth daily. For 7 days  14 tablet  0  . Cholecalciferol (VITAMIN D PO) Take 1 tablet by mouth daily.      . Coenzyme Q10 (CO Q 10 PO) Take 1 capsule by mouth daily.      . Cyanocobalamin (B-12 PO) Take 1 tablet by mouth daily. 200      . ibuprofen (ADVIL,MOTRIN) 200 MG tablet Take 200 mg by mouth every 6 (six) hours as needed.      . Nutritional Supplements (ESTROVEN PO) Take 1 tablet by mouth daily.      . prochlorperazine (COMPAZINE) 10 MG tablet Take 1 tablet (10 mg total) by mouth every 6 (six) hours as needed for nausea or vomiting.  60 tablet  0   No current facility-administered medications for this encounter.   Facility-Administered Medications Ordered in Other Encounters  Medication Dose Route Frequency Provider Last Rate Last Dose  . sodium chloride 0.9 % injection 10 mL  10 mL Intracatheter PRN Curt Bears, MD        Physical Findings: The patient is in no acute distress. Patient is alert and oriented.  vitals were not taken for this visit..   General: Well-developed, in no acute distress Cardiovascular: Regular rate and rhythm Respiratory: Clear to auscultation bilaterally    Lab Findings: Lab Results  Component Value Date   WBC 5.7 02/28/2014   HGB 11.0* 02/28/2014   HCT 32.5* 02/28/2014   MCV 97.8 02/28/2014   PLT 212 02/28/2014     Radiographic Findings: Ct Chest W Contrast  02/28/2014   CLINICAL DATA:  Restaging non-small cell lung cancer in the right lung diagnosed December 2012. Prior chemotherapy and radiation.  EXAM: CT CHEST WITH CONTRAST  TECHNIQUE: Multidetector CT imaging of the chest was performed during intravenous contrast administration.  CONTRAST:  55mL OMNIPAQUE IOHEXOL 300 MG/ML  SOLN  COMPARISON:  Chest CT 10/25/2013.  PET CT 11/14/2013.  FINDINGS: The right upper lobe mass anteriorly has decreased significantly in size, measuring 2.9 x 2.7 cm on image 24 of series 2. This previously measured 6.5 x 5.5 cm. Bandlike atelectasis noted medial to the mass adjacent to the mediastinum. Scattered small adjacent clustered nodules have also improved.  Ground-glass density noted in the right upper lobe more  superiorly measures 13 mm on image 15 and is stable. Calcified granuloma noted in the left lower lobe. Small subpleural nodule along the minor fissure on image 26 measures 5 mm, stable.  Clustered nodular densities are noted in the right lower lobe, new since prior study, likely inflammatory. Recommend attention to these areas on followup imaging. No pleural effusions or pericardial effusion.  Heart is normal size. Aorta is normal caliber. No mediastinal, hilar, or axillary adenopathy.  Left thyroid nodule measures 2.1 cm, stable. Chest wall soft tissues are normal.  Imaging into the upper abdomen demonstrates diffuse fatty infiltration of the liver. 1.5 cm hypodensity noted  anteriorly on image 53 was not definitively imaged on the prior study. More superiorly in the liver, there is a 10 mm hypodensity on image 45, stable. These likely reflect cysts. Adrenal glands are normal.  No acute bony abnormality or focal bone lesion.  IMPRESSION: Significant decrease in the size of the anterior right upper lobe mass. Adjacent small clustered nodules have also decreased in size.  Subtle ground-glass density more superiorly in the right upper lobe measures 13 mm and is stable. Recommend attention on followup imaging.  New clustered nodular densities in the right lower lobe likely are inflammatory or related to treatment. Recommend attention on followup imaging.   Electronically Signed   By: Rolm Baptise M.D.   On: 02/28/2014 10:39    Impression:    The patient has done satisfactorily in terms of recovering from radiation treatment. The patient has undergone a repeat CT scan of the chest which it showed a treatment response. The patient has discussed this result with medical oncology and a decision has been made to proceed with additional consolidative chemotherapy.  Plan:  The patient will return to clinic in 6 months.   Jodelle Gross, M.D., Ph.D.

## 2014-03-05 NOTE — Progress Notes (Signed)
Follow up s/p radiation chest 2/16/-15- 01/30/14 C/o esophagus, "feels like it is squeezed in a knot",eats solid foods a little at a time stated., not coughing much, k+ 2.8, taking Potassium with applesauce,  No difficulty swallowing liquids, still feels like something is stuck at botom of her sternum, no nausea, fatigue is better, painful arthitis in her back

## 2014-03-06 ENCOUNTER — Ambulatory Visit: Payer: Medicare Other | Admitting: Radiation Oncology

## 2014-03-10 ENCOUNTER — Telehealth: Payer: Self-pay | Admitting: Medical Oncology

## 2014-03-10 NOTE — Telephone Encounter (Signed)
Having esophageal irritation and painful swallowing. She saw Dr Wilson Singer and she said her "esophagus is like raw meat". Requests to postpone chemo for one week. Onc tx request sent.

## 2014-03-13 ENCOUNTER — Telehealth: Payer: Self-pay | Admitting: *Deleted

## 2014-03-13 NOTE — Telephone Encounter (Signed)
Per staff message and POF I have scheduled appts.  JMW  

## 2014-03-18 ENCOUNTER — Telehealth: Payer: Self-pay | Admitting: *Deleted

## 2014-03-18 ENCOUNTER — Other Ambulatory Visit: Payer: Self-pay | Admitting: *Deleted

## 2014-03-18 ENCOUNTER — Other Ambulatory Visit: Payer: Medicare Other

## 2014-03-18 ENCOUNTER — Ambulatory Visit: Payer: Medicare Other

## 2014-03-18 DIAGNOSIS — C3491 Malignant neoplasm of unspecified part of right bronchus or lung: Secondary | ICD-10-CM

## 2014-03-18 NOTE — Telephone Encounter (Signed)
Pt in the lab and does not want any lab work done or any chemo today.  She does not want to see or talk to anyone, she just wants to go home and she will come back next week for her appts.  SLJ

## 2014-03-19 ENCOUNTER — Ambulatory Visit: Payer: Medicare Other

## 2014-03-25 ENCOUNTER — Ambulatory Visit (HOSPITAL_BASED_OUTPATIENT_CLINIC_OR_DEPARTMENT_OTHER): Payer: Medicare Other | Admitting: Physician Assistant

## 2014-03-25 ENCOUNTER — Encounter: Payer: Self-pay | Admitting: Physician Assistant

## 2014-03-25 ENCOUNTER — Other Ambulatory Visit (HOSPITAL_BASED_OUTPATIENT_CLINIC_OR_DEPARTMENT_OTHER): Payer: Medicare Other

## 2014-03-25 ENCOUNTER — Telehealth: Payer: Self-pay | Admitting: Internal Medicine

## 2014-03-25 ENCOUNTER — Telehealth: Payer: Self-pay | Admitting: Medical Oncology

## 2014-03-25 ENCOUNTER — Ambulatory Visit: Payer: Medicare Other

## 2014-03-25 VITALS — BP 103/57 | HR 90 | Temp 98.3°F | Resp 18 | Ht 66.0 in | Wt 152.8 lb

## 2014-03-25 DIAGNOSIS — C3491 Malignant neoplasm of unspecified part of right bronchus or lung: Secondary | ICD-10-CM

## 2014-03-25 DIAGNOSIS — C770 Secondary and unspecified malignant neoplasm of lymph nodes of head, face and neck: Secondary | ICD-10-CM

## 2014-03-25 DIAGNOSIS — E876 Hypokalemia: Secondary | ICD-10-CM

## 2014-03-25 DIAGNOSIS — C3490 Malignant neoplasm of unspecified part of unspecified bronchus or lung: Secondary | ICD-10-CM

## 2014-03-25 DIAGNOSIS — C342 Malignant neoplasm of middle lobe, bronchus or lung: Secondary | ICD-10-CM

## 2014-03-25 DIAGNOSIS — R1310 Dysphagia, unspecified: Secondary | ICD-10-CM

## 2014-03-25 LAB — COMPREHENSIVE METABOLIC PANEL (CC13)
ALT: 28 U/L (ref 0–55)
ANION GAP: 13 meq/L — AB (ref 3–11)
AST: 20 U/L (ref 5–34)
Albumin: 3.4 g/dL — ABNORMAL LOW (ref 3.5–5.0)
Alkaline Phosphatase: 82 U/L (ref 40–150)
BILIRUBIN TOTAL: 0.77 mg/dL (ref 0.20–1.20)
BUN: 13.6 mg/dL (ref 7.0–26.0)
CO2: 27 meq/L (ref 22–29)
CREATININE: 1 mg/dL (ref 0.6–1.1)
Calcium: 9.8 mg/dL (ref 8.4–10.4)
Chloride: 102 mEq/L (ref 98–109)
GLUCOSE: 205 mg/dL — AB (ref 70–140)
Potassium: 2.9 mEq/L — CL (ref 3.5–5.1)
Sodium: 141 mEq/L (ref 136–145)
Total Protein: 6.4 g/dL (ref 6.4–8.3)

## 2014-03-25 LAB — CBC WITH DIFFERENTIAL/PLATELET
BASO%: 1.2 % (ref 0.0–2.0)
Basophils Absolute: 0.1 10*3/uL (ref 0.0–0.1)
EOS%: 2.1 % (ref 0.0–7.0)
Eosinophils Absolute: 0.1 10*3/uL (ref 0.0–0.5)
HEMATOCRIT: 36.7 % (ref 34.8–46.6)
HGB: 12.2 g/dL (ref 11.6–15.9)
LYMPH%: 10.9 % — AB (ref 14.0–49.7)
MCH: 33.8 pg (ref 25.1–34.0)
MCHC: 33.2 g/dL (ref 31.5–36.0)
MCV: 101.6 fL — ABNORMAL HIGH (ref 79.5–101.0)
MONO#: 0.6 10*3/uL (ref 0.1–0.9)
MONO%: 9.3 % (ref 0.0–14.0)
NEUT#: 4.6 10*3/uL (ref 1.5–6.5)
NEUT%: 76.5 % (ref 38.4–76.8)
PLATELETS: 205 10*3/uL (ref 145–400)
RBC: 3.61 10*6/uL — ABNORMAL LOW (ref 3.70–5.45)
RDW: 16.2 % — ABNORMAL HIGH (ref 11.2–14.5)
WBC: 6.1 10*3/uL (ref 3.9–10.3)
lymph#: 0.7 10*3/uL — ABNORMAL LOW (ref 0.9–3.3)

## 2014-03-25 MED ORDER — POTASSIUM CHLORIDE CRYS ER 20 MEQ PO TBCR: 40.0000 meq | EXTENDED_RELEASE_TABLET | Freq: Every day | ORAL | Status: DC

## 2014-03-25 MED ORDER — SUCRALFATE 1 GM/10ML PO SUSP: ORAL | Status: DC

## 2014-03-25 NOTE — Telephone Encounter (Signed)
Message copied by Ardeen Garland on Tue Mar 25, 2014  4:48 PM ------      Message from: Curt Bears      Created: Tue Mar 25, 2014 12:59 PM       Call patient with the result and order K Dur 40 meq po qd X 7 days ------

## 2014-03-25 NOTE — Telephone Encounter (Signed)
Pt received rx today.

## 2014-03-25 NOTE — Progress Notes (Signed)
Quick Note:  Call patient with the result and order K Dur 40 meq po qd X 7 days ______

## 2014-03-25 NOTE — Progress Notes (Addendum)
[place] Telephone:([phone]) [phone]   Fax:([phone]) [phone]  OFFICE PROGRESS NOTE  [name], MD [address] [place] [address] [place] [address]  DIAGNOSIS: Squamous cell lung cancer  Primary site: Lung (Right)  Staging method: AJCC 7th Edition  Clinical: Stage IIIB (T3, N3, M0)  Summary: Stage IIIB (T3, N3, M0)   PRIOR THERAPY: Concurrent chemoradiation with weekly chemotherapy in the form of carboplatin for AUC of 2 and paclitaxel 45 mg/m2. Status post 7 week of treatment.  Last dose was given 01/27/2014 no significant response in her disease.  CURRENT THERAPY: Consolidation chemotherapy with carboplatin for AUC of 5 and paclitaxel 175 mg/M2 every 3 weeks with Neulasta support. First dose on 03/17/2014.  CHEMOTHERAPY INTENT: Control/Palliative  CURRENT # OF CHEMOTHERAPY CYCLES: 1 CURRENT ANTIEMETICS: Zofran, dexamethasone, Compazine  CURRENT SMOKING STATUS: Former smoker, quit 12/05/1997  ORAL CHEMOTHERAPY AND CONSENT: n/a  CURRENT BISPHOSPHONATES USE: None  PAIN MANAGEMENT: no cancer related pain, patient does have RA  NARCOTICS INDUCED CONSTIPATION: none  LIVING WILL AND CODE STATUS: ?   INTERVAL HISTORY: Megan [name] 73 y.o. female returns to the clinic today for followup visit.  She is scheduled to start consolidation chemotherapy today. She presents today with multiple complaints surrounding continued difficulty swallowing. She has lost 4.2 pounds as her last office visit. She feels as though food in her medications get stuck mid esophagus. She is now approximately 2 months out from her course of concurrent chemoradiation. She also complains of fatigue interfering with her activity level.  The patient denied having any night sweats. She has no nausea or vomiting. She has no fever or chills. She denied having any significant chest pain but continues to have shortness breath with exertion. She also reports an intermittent dry, non-productive  cough. She has Tessalon Perles at home but has not taken any to see if it made any difference with her cough. Denied hemoptysis.    MEDICAL HISTORY: Past Medical History  Diagnosis Date  . Hypertension   . Diabetes mellitus   . Rheumatoid arthritis   . GERD (gastroesophageal reflux disease)     no meds for  . Hx of radiation therapy 12/16/13-01/30/14    lung 66Gy    ALLERGIES:  is allergic to remicade.  MEDICATIONS:  Current Outpatient Prescriptions  Medication Sig Dispense Refill  . losartan-hydrochlorothiazide (HYZAAR) 100-25 MG per tablet Take 1 tablet by mouth daily.      . prochlorperazine (COMPAZINE) 10 MG tablet Take 1 tablet (10 mg total) by mouth every 6 (six) hours as needed for nausea or vomiting.  60 tablet  0  . Coenzyme Q10 (CO Q 10 PO) Take 1 capsule by mouth daily.      . Cyanocobalamin (B-12 PO) Take 1 tablet by mouth daily. 200      . Nutritional Supplements (ESTROVEN PO) Take 1 tablet by mouth daily.      . potassium chloride SA (K-DUR,KLOR-CON) 20 MEQ tablet Take 2 tablets (40 mEq total) by mouth daily. For 7 days  14 tablet  0  . sucralfate (CARAFATE) 1 GM/10ML suspension Take 10 mL by mouth 4 times daily before meals.  240 mL  0   No current facility-administered medications for this visit.   Facility-Administered Medications Ordered in Other Visits  Medication Dose Route Frequency Provider Last Rate Last Dose  . sodium chloride 0.9 % injection 10 mL  10 mL Intracatheter PRN Curt Bears, MD  SURGICAL HISTORY:  Past Surgical History  Procedure Laterality Date  . Cholecystectomy    . Abdominal hysterectomy      REVIEW OF SYSTEMS:  A comprehensive review of systems was negative except for: Constitutional: positive for fatigue and weight loss Ears, nose, mouth, throat, and face: positive for sore throat and sensation of food and pills getting stuck mid-esophagus   PHYSICAL EXAMINATION: General appearance: alert, cooperative and no distress Head:  Normocephalic, without obvious abnormality, atraumatic Neck: no adenopathy, no JVD, supple, symmetrical, trachea midline and thyroid not enlarged, symmetric, no tenderness/mass/nodules Lymph nodes: Cervical, supraclavicular, and axillary nodes normal. Resp: clear to auscultation bilaterally Back: symmetric, no curvature. ROM normal. No CVA tenderness. Cardio: regular rate and rhythm, S1, S2 normal, no murmur, click, rub or gallop GI: soft, non-tender; bowel sounds normal; no masses,  no organomegaly Extremities: extremities normal, atraumatic, no cyanosis or edema  ECOG PERFORMANCE STATUS: 1 - Symptomatic but completely ambulatory  Blood pressure 103/57, pulse 90, temperature 98.3 F (36.8 C), temperature source Oral, resp. rate 18, height 5\' 6"  (1.676 m), weight 152 lb 12.8 oz (69.31 kg).  LABORATORY DATA: Lab Results  Component Value Date   WBC 6.1 03/25/2014   HGB 12.2 03/25/2014   HCT 36.7 03/25/2014   MCV 101.6* 03/25/2014   PLT 205 03/25/2014      Chemistry      Component Value Date/Time   NA 141 03/25/2014 0958   K 2.9* 03/25/2014 0958   CO2 27 03/25/2014 0958   BUN 13.6 03/25/2014 0958   CREATININE 1.0 03/25/2014 0958      Component Value Date/Time   CALCIUM 9.8 03/25/2014 0958   ALKPHOS 82 03/25/2014 0958   AST 20 03/25/2014 0958   ALT 28 03/25/2014 0958   BILITOT 0.77 03/25/2014 0958       RADIOGRAPHIC STUDIES: Ct Chest W Contrast  02/28/2014   CLINICAL DATA:  Restaging non-small cell lung cancer in the right lung diagnosed December 2012. Prior chemotherapy and radiation.  EXAM: CT CHEST WITH CONTRAST  TECHNIQUE: Multidetector CT imaging of the chest was performed during intravenous contrast administration.  CONTRAST:  41mL OMNIPAQUE IOHEXOL 300 MG/ML  SOLN  COMPARISON:  Chest CT 10/25/2013.  PET CT 11/14/2013.  FINDINGS: The right upper lobe mass anteriorly has decreased significantly in size, measuring 2.9 x 2.7 cm on image 24 of series 2. This previously measured 6.5 x 5.5  cm. Bandlike atelectasis noted medial to the mass adjacent to the mediastinum. Scattered small adjacent clustered nodules have also improved.  Ground-glass density noted in the right upper lobe more superiorly measures 13 mm on image 15 and is stable. Calcified granuloma noted in the left lower lobe. Small subpleural nodule along the minor fissure on image 26 measures 5 mm, stable.  Clustered nodular densities are noted in the right lower lobe, new since prior study, likely inflammatory. Recommend attention to these areas on followup imaging. No pleural effusions or pericardial effusion.  Heart is normal size. Aorta is normal caliber. No mediastinal, hilar, or axillary adenopathy.  Left thyroid nodule measures 2.1 cm, stable. Chest wall soft tissues are normal.  Imaging into the upper abdomen demonstrates diffuse fatty infiltration of the liver. 1.5 cm hypodensity noted anteriorly on image 53 was not definitively imaged on the prior study. More superiorly in the liver, there is a 10 mm hypodensity on image 45, stable. These likely reflect cysts. Adrenal glands are normal.  No acute bony abnormality or focal bone lesion.  IMPRESSION: Significant decrease  in the size of the anterior right upper lobe mass. Adjacent small clustered nodules have also decreased in size.  Subtle ground-glass density more superiorly in the right upper lobe measures 13 mm and is stable. Recommend attention on followup imaging.  New clustered nodular densities in the right lower lobe likely are inflammatory or related to treatment. Recommend attention on followup imaging.   Electronically Signed   By: Rolm Baptise M.D.   On: 02/28/2014 10:39   ASSESSMENT AND PLAN: This is a very pleasant 73 years old white female with history of stage IIIB non-small cell lung cancer status post a course of concurrent chemoradiation with weekly carboplatin and paclitaxel is status post 7 cycles. The patient tolerated her treatment fairly well with no  significant adverse effects except for the radiation induced esophagitis.  Her recent scan showed significant improvement in her disease. The patient was discussed with him and seen by Dr. Julien Nordmann. She was to start cycle #1 of her consolidation chemotherapy with carboplatin for AUC of 5 and paclitaxel 175 mg/M2 every 3 weeks with Neulasta support.  Her persistent dysphagia/odynophagia is concerning particularly with her description of a sensation of food being stuck mid esophagus. We'll refer the patient to gastroenterology for further evaluation and management. We'll postpone the start of her consolidation chemotherapy for 3 weeks while she is evaluated by gastroenterology.  Review of her chemistries revealed hypokalemia 2.9 mEq per liter. A prescription for potassium chloride 40 mEq by mouth daily for the next 7 days was sent her pharmacy of record via E. scribed. She would come back for followup visit in 3 weeks for reevaluation.   She was advised to call immediately if she has any concerning symptoms in the interval. The patient voices understanding of current disease status and treatment options and is in agreement with the current care plan.  All questions were answered. The patient knows to call the clinic with any problems, questions or concerns. We can certainly see the patient much sooner if necessary.   Disclaimer: This note was dictated with voice recognition software. Similar sounding words can inadvertently be transcribed and may not be corrected upon review.  Carlton Adam, PA-C  ADDENDUM: Hematology/Oncology Attending: I had a face to face encounter with the patient today. I recommended her care plan. This is a very pleasant 73 years old white female with history of stage IIIB non-small cell lung cancer status post a course of concurrent chemoradiation with weekly carboplatin and paclitaxel with partial response. She was supposed to start the first cycle of consolidation  chemotherapy with carboplatin and paclitaxel today but the patient continues to complain of increasing fatigue and weakness as well as dysphagia and odynophagia. She doesn't feel well enough to proceed with chemotherapy at this point. I will arrange for the patient to see gastroenterology for consideration of upper endoscopy and evaluation of her persistent dysphagia and odynophagia. I will delay the start of the first cycle of her treatment by 3 weeks until she has completed evaluation of her condition by gastroenterology. For the hypokalemia, we will start the patient on KDur 40 mEq by mouth daily for 7 days. She would come back for followup visit in 3 weeks for evaluation before starting the first cycle of her consolidation chemotherapy. She was advised to call immediately if she has any concerning symptoms in the interval. Curt Bears, MD 03/25/2014

## 2014-03-25 NOTE — Patient Instructions (Signed)
Your potassium level is low, take your potassium as prescribed for the next 7 days Prescription for Carafate suspension was sent to the [facility]. Take as as prescribed. Your being referred to a gastroenterologist for further evaluation and management of your difficulty/discomfort with swallowing. Your chemotherapy is on hold for now until you have completed your gastroenterology evaluation Followup in 3 weeks

## 2014-03-25 NOTE — Telephone Encounter (Signed)
gv and printed appt sched and avs for pt for June....sed added tx.

## 2014-03-31 ENCOUNTER — Telehealth: Payer: Self-pay | Admitting: *Deleted

## 2014-03-31 ENCOUNTER — Telehealth: Payer: Self-pay | Admitting: Internal Medicine

## 2014-03-31 ENCOUNTER — Other Ambulatory Visit: Payer: Medicare Other

## 2014-03-31 NOTE — Telephone Encounter (Signed)
Patient returning call.

## 2014-03-31 NOTE — Telephone Encounter (Signed)
appt set for 04-01-14.  Bing, CMA

## 2014-03-31 NOTE — Telephone Encounter (Signed)
Pt requesting Dx with Stage 3 lung cancer-- finished treatment 01/30/14 Pt c/o increased pain in Right lung -- pt reports that Dr Inda Merlin aware of this pain and is wanting her to see a GI specialist for her dysphagia, pt states he does not seem to be concerned with the pain.  April 15, 2014-- pt due to start extra rounds of Chemo. Pt states that she feels this is abnormal and would like to have an appt with MR asap.  Can leave a detailed message if pt does not answer.

## 2014-03-31 NOTE — Telephone Encounter (Signed)
Given my tight schedule this week would she be willing to see NP Tammy? Please let her know that I recommend she start with NP. Please reassure her NP is very good and will be in close touch about her care with me  Thanks  Dr. Brand Males, M.D., [facility].C.P Pulmonary and Critical Care Medicine Staff Physician [facility] Pager: [phone], If no answer or between  15:00h - 7:00h: call [phone]  03/31/2014 12:02 PM

## 2014-03-31 NOTE — Telephone Encounter (Signed)
Pt called wanting to know if a gastroenterology referral has been made.  Informed her that a referral was placed, advised that if she has any questions about the referral she can speak to the scheduling dept.  Pt verbalized understanding.  SLJ

## 2014-03-31 NOTE — Telephone Encounter (Signed)
lmomtcb for pt. Did not leave detailed msg given we will need to know how to proceed with scheduling pt an appt with TP.

## 2014-04-01 ENCOUNTER — Ambulatory Visit (INDEPENDENT_AMBULATORY_CARE_PROVIDER_SITE_OTHER)
Admission: RE | Admit: 2014-04-01 | Discharge: 2014-04-01 | Disposition: A | Discharge status: Home / Self Care | Payer: Medicare Other | Source: Ambulatory Visit | Attending: Adult Health | Admitting: Adult Health

## 2014-04-01 ENCOUNTER — Encounter: Payer: Self-pay | Admitting: Physician Assistant

## 2014-04-01 ENCOUNTER — Ambulatory Visit (INDEPENDENT_AMBULATORY_CARE_PROVIDER_SITE_OTHER): Payer: Medicare Other | Admitting: Adult Health

## 2014-04-01 ENCOUNTER — Encounter: Payer: Self-pay | Admitting: Adult Health

## 2014-04-01 VITALS — BP 112/68 | HR 87 | Temp 98.1°F | Ht 66.0 in | Wt 152.8 lb

## 2014-04-01 DIAGNOSIS — R071 Chest pain on breathing: Secondary | ICD-10-CM

## 2014-04-01 DIAGNOSIS — R0789 Other chest pain: Secondary | ICD-10-CM | POA: Insufficient documentation

## 2014-04-01 DIAGNOSIS — C3490 Malignant neoplasm of unspecified part of unspecified bronchus or lung: Secondary | ICD-10-CM

## 2014-04-01 MED ORDER — PANTOPRAZOLE SODIUM 40 MG PO TBEC: 40.0000 mg | DELAYED_RELEASE_TABLET | Freq: Every day | ORAL | Status: DC

## 2014-04-01 NOTE — Patient Instructions (Addendum)
Begin Protonix 40mg  daily before meal .  GERD diet  Need to check to see if GI referral has been done from Oncology if not we can make referral.  Eat small softer diet , do not skip meals.  Chest xray today .  Tylenol As needed  Pain .  Follow up Dr. Chase Caller in 3-4 weeks and As needed   Please contact office for sooner follow up if symptoms do not improve or worsen or seek emergency care

## 2014-04-01 NOTE — Progress Notes (Signed)
Subjective:    Patient ID: Megan [name], female    DOB: Apr 03, 1941, 73 y.o.   MRN: [id number] PCP [name], MD  HPI IOV 11/07/2013  73 year old female. Accompanied by friend Mr Monica Becton who it is ok to share all health info. She says mid October when in [place] she developed acute onset of dry cough. Vividly remebers exact moment when she had cough. Is mild, persistent, dry in quality and not associate with sinus drainage, wheeze, dyspnea, hemoptysis or weight loss. Followed with PMD and then resulted in CDXR and then CT showes large RML mass that is necrotic with subcarinal node. THerefore referred here.   Lung mass relevant hx   - rheumatoid arthtiris +. On humira x 5 years   -  reports that she quit smoking about 15 years ago. Her smoking use included Cigarettes. She has a 135 pack-year smoking history. She does not have any smokeless tobacco history on file.     CLINICAL DATA: Nonproductive cough, remote history of smoking,  abnormal pulmonary nodule reported on previous chest x-ray  EXAM:  CT CHEST WITH CONTRAST  TECHNIQUE:  Multidetector CT imaging of the chest was performed during  intravenous contrast administration.  CONTRAST: 78mL OMNIPAQUE IOHEXOL 300 MG/ML SOLN  COMPARISON: PA and lateral chest x-ray of October 22, 2013.  FINDINGS:  There is dense consolidation of the anterior inferior aspect of the  right upper lobe corresponding to the abnormal density on the chest  x-ray. No air bronchograms are demonstrated within it. There is  patchy increased density along its periphery. The consolidated lung  abuts the mediastinum and the anterior pleural surface and extends  posteromedially to the right hilum. No calcifications are  demonstrated within this consolidated lung. The dimensions are 6.5  cm transversely x 5.5 cm AP x 5 cm in superior inferior dimension.  Elsewhere within the lungs no abnormal masses or parenchymal  consolidation are  demonstrated. There is a calcified 3 mm diameter  subpleural nodule in the left lower lobe on image 39 of series 2.  At mediastinal window settings the cardiac chambers are normal in  size. There is subcarinal lymphadenopathy measuring 2.1 x 2.5 cm.  There is a lymph node measuring approximately 1.3 cm in diameter in  the anterior right hilum seen on image 25 of series 2. There are  calcified normal-sized left infrahilar lymph nodes. The thoracic  esophagus does not appear abnormally distended. There is no pleural  nor pericardial effusion. The caliber of the thoracic aorta is  normal. There are no bulky axillary lymph nodes.  Within the upper abdomen the observed portions of the liver, spleen,  and kidneys are normal in appearance. The thoracic vertebral bodies  are preserved in height. There is degenerative disc change at  multiple levels with calcification of the anterior longitudinal  ligament. The sternum and retrosternal soft tissues appear normal.  IMPRESSION:  1. There is a irregularly marginated parenchymal consolidation or  soft tissue density mass in the anterior inferior aspect of the  right upper lobe. A borderline enlarged right hilar lymph node as  well as an enlarged subcarinal lymph node are demonstrated. The  findings are worrisome for malignancy although certainly atypical  infection could be present.  2. There is no pleural or pericardial effusion. There is no evidence  of CHF.  3. There is evidence of previous granulomatous infection on the  left.  Electronically Signed  By: [name] Martinique  On: 10/25/2013 17:25  04/01/2014 Acute OV (Stage III B Non small cell lung cancer )  Complains of increased/consistent R lung pain, increased SOB, dry cough, some chest congestion, slight tightness, improving wheezing x4-6 weeks.   Seems to all stem around swallowing . When she tries to eat or swallow she feels discomfort along along the mid chest and right lung area. Has trouble  swallowing at times. Eating softer diet but feels food gets stuck . No exertional chest pain, orthopnea, hemoptysis, calf pain, or discolored mucus or fever.  Seen by Oncology last week and referred to GI. OV pending.  Her chemo is on hold for now .  She has completed XRT .  She had a CT chest done on 5/1 that showed decrease in size of RUL mass.  Pt seems very confused today with "whether I have cancer or not " . We discussed her Dx and ongoing treatment with Oncology . Support provided. Denies confusion . Previous MRI brain neg for mets.  Denies any f/c/s, hemoptysis, nausea, vomiting. Does have heartburn  No otc meds used for tx.  Eating seems to be associated with pain.      Review of Systems  Constitutional: Negative for fever and unexpected weight change.  HENT: Negative for congestion, dental problem, ear pain, nosebleeds, postnasal drip, rhinorrhea, sinus pressure, sneezing, sore throat and trouble swallowing.   Eyes: Negative for redness and itching.  Respiratory: Positive for cough.  Cardiovascular: Negative for palpitations and leg swelling.  Gastrointestinal: Negative for nausea and vomiting.  Genitourinary: Negative for dysuria.  Musculoskeletal: Negative for joint swelling.  Skin: Negative for rash.  Neurological: Negative for headaches.  Hematological: Does not bruise/bleed easily.  Psychiatric/Behavioral: Negative for dysphoric mood. The patient is not nervous/anxious.        Objective:   Physical Exam  Vitals reviewed. Constitutional: She is oriented to person, place, and time. She appears well-developed and well-nourished. No distress.  HENT:  Head: Normocephalic and atraumatic.  Right Ear: External ear normal.  Left Ear: External ear normal.  Mouth/Throat: Oropharynx is clear and moist. No oropharyngeal exudate.  Eyes: Conjunctivae and EOM are normal. Pupils are equal, round, and reactive to light. Right eye exhibits no discharge. Left eye exhibits no  discharge. No scleral icterus.  Neck: Normal range of motion. Neck supple. No JVD present. No tracheal deviation present. No thyromegaly present.  Cardiovascular: Normal rate, regular rhythm, normal heart sounds and intact distal pulses.  Exam reveals no gallop and no friction rub.   No murmur heard. Pulmonary/Chest: Effort normal and breath sounds normal. No respiratory distress. She has no wheezes. She has no rales. Minimal right chest wall tenderness, no deformity noted, no rash.  Abdominal: Soft. Bowel sounds are normal. She exhibits no distension and no mass. There is no tenderness. There is no rebound and no guarding.  Musculoskeletal: Normal range of motion. She exhibits no edema and no tenderness.  Lymphadenopathy:    She has no cervical adenopathy.  Neurological: She is alert and oriented to person, place, and time. She has normal reflexes. No cranial nerve deficit. She exhibits normal muscle tone. Coordination normal.  Skin: Skin is warm and dry. No rash noted. She is not diaphoretic. No erythema. No pallor.  Psychiatric: She has a normal mood and affect. Her behavior is normal. Judgment and thought content normal.          Assessment & Plan:

## 2014-04-01 NOTE — Assessment & Plan Note (Signed)
Continue w/ follow up with Oncology and planned tx plan

## 2014-04-01 NOTE — Assessment & Plan Note (Signed)
Right sided chest wall pain ? Etiology  Possibly referred pain from GERD /radiation induced esophagitis  Needs to see GI for Dysphagia   Plan  Begin Protonix 40mg  daily before meal .  GERD diet  Need to check to see if GI referral has been done from Oncology if not we can make referral.  Eat small softer diet , do not skip meals.  Chest xray today .  Tylenol As needed  Pain .  Follow up Dr. Chase Caller in 3-4 weeks and As needed   Please contact office for sooner follow up if symptoms do not improve or worsen or seek emergency care

## 2014-04-07 ENCOUNTER — Other Ambulatory Visit: Payer: Medicare Other

## 2014-04-08 ENCOUNTER — Ambulatory Visit: Payer: Medicare Other

## 2014-04-08 ENCOUNTER — Encounter: Payer: Self-pay | Admitting: Physician Assistant

## 2014-04-08 ENCOUNTER — Ambulatory Visit (INDEPENDENT_AMBULATORY_CARE_PROVIDER_SITE_OTHER): Payer: Medicare Other | Admitting: Physician Assistant

## 2014-04-08 VITALS — BP 112/72 | HR 99 | Ht 66.0 in | Wt 151.0 lb

## 2014-04-08 DIAGNOSIS — R1310 Dysphagia, unspecified: Secondary | ICD-10-CM

## 2014-04-08 DIAGNOSIS — K209 Esophagitis, unspecified without bleeding: Secondary | ICD-10-CM

## 2014-04-08 DIAGNOSIS — C3490 Malignant neoplasm of unspecified part of unspecified bronchus or lung: Secondary | ICD-10-CM

## 2014-04-08 MED ORDER — PANTOPRAZOLE SODIUM 40 MG PO TBEC: 40.0000 mg | DELAYED_RELEASE_TABLET | Freq: Every day | ORAL | Status: DC

## 2014-04-08 NOTE — Patient Instructions (Signed)
You have been scheduled for an endoscopy with propofol. Please follow written instructions given to you at your visit today. If you use inhalers (even only as needed), please bring them with you on the day of your procedure. Your physician has requested that you go to www.startemmi.com and enter the access code given to you at your visit today. This web site gives a general overview about your procedure. However, you should still follow specific instructions given to you by our office regarding your preparation for the procedure.  We sent a prescription for the pantoprazole sodium 40 mg, take 1 tab twice daily for 1 month.  [facility] [address]

## 2014-04-08 NOTE — Progress Notes (Addendum)
Subjective:    Patient ID: Megan [name], female    DOB: 1941-10-01, 73 y.o.   MRN: [id number]  HPI   Megan [name] is a pleasant 73 year old white female new to GI today referred by pulmonary for evaluation of dysphagia and odynophagia. Patient had recently been diagnosed with a stage IIIB (T3 N3 M0) squamous cell lung cancer of the right middle lobe and has completed a course of radiation from February through mid April 2015. She is anticipating starting chemotherapy on June 16 and is being followed by Dr. Earlie Server. Patient says she had no esophageal issues prior to undergoing radiation and that she started having dysphagia and odynophagia about halfway through her radiation course. She says she has had a lot of difficulty swallowing over the past 6 or 7 weeks and has lost a total of 30 pounds. She had been given Carafate recently but thought that made her nauseated so she stopped taking it. She was seen by pulmonary last week and started on protonix which she does feel is helping. She complains of pain with swallowing liquids and solids,  she's not had any regurgitation. She had also been having what she calls referred pain into her right chest which seems to be better over the past week. She has started drinking Ensure supplements but really has not had much of an appetite. Other medical problems include hypertension diabetes mellitus and history of rheumatoid arthritis or which she had been on Humira. Her Humira has been stopped until she is able to complete chemotherapy etc. CT of the chest done 01/12/2014 showed a right upper lobe mass which had decreased in size measuring 2.9 x 2.7 cm groundglass density in the right upper lobe more superiorly small subpleural nodule along the minor fissure clustered nodular densities are noted in the right lower lobe new, no mediastinal hilar or axillary adenopathy there's fatty infiltration of the liver and what are felt to be 2 small hepatic cysts.   Review  of Systems  Constitutional: Positive for appetite change and unexpected weight change.  HENT: Positive for trouble swallowing.   Eyes: Negative.   Respiratory: Negative.   Cardiovascular: Positive for chest pain.  Gastrointestinal: Negative.   Endocrine: Negative.   Genitourinary: Negative.   Musculoskeletal: Negative.   Allergic/Immunologic: Negative.   Neurological: Negative.   Hematological: Negative.   Psychiatric/Behavioral: Negative.    Outpatient Prescriptions Prior to Visit  Medication Sig Dispense Refill  . losartan-hydrochlorothiazide (HYZAAR) 100-25 MG per tablet Take 1 tablet by mouth daily.      . potassium chloride SA (K-DUR,KLOR-CON) 20 MEQ tablet Take 2 tablets (40 mEq total) by mouth daily. For 7 days  14 tablet  0  . prochlorperazine (COMPAZINE) 10 MG tablet Take 1 tablet (10 mg total) by mouth every 6 (six) hours as needed for nausea or vomiting.  60 tablet  0  . sucralfate (CARAFATE) 1 GM/10ML suspension Take 10 mL by mouth 4 times daily before meals.  240 mL  0  . pantoprazole (PROTONIX) 40 MG tablet Take 1 tablet (40 mg total) by mouth daily.  30 tablet  1   Facility-Administered Medications Prior to Visit  Medication Dose Route Frequency Provider Last Rate Last Dose  . sodium chloride 0.9 % injection 10 mL  10 mL Intracatheter PRN Curt Bears, MD       Allergies  Allergen Reactions  . Remicade [Infliximab] Anaphylaxis       Patient Active Problem List   Diagnosis Date Noted  . Right-sided  chest wall pain 04/01/2014  . Cancer of middle lobe of lung 01/17/2014  . Squamous cell lung cancer 11/28/2013  . Lung mass 11/09/2013  . Need for prophylactic vaccination and inoculation against influenza 11/09/2013   History  Substance Use Topics  . Smoking status: Former Smoker -- 3.00 packs/day for 45 years    Types: Cigarettes    Quit date: 12/05/1997  . Smokeless tobacco: Never Used  . Alcohol Use: Yes     Comment: occasional   family history  includes Heart attack in her other; Hemachromatosis in her other; Hypertension in her other; Rheum arthritis in an other family member; Stroke in her other.  Objective:   Physical Exam    well-developed older white female in no acute distress, pleasant blood pressure 112/72 pulse 99 height 5 foot 6 weight 151. HEENT; nontraumatic normal cephalic EOMI PERRLA sclera anicteric, Supple ;no JVD, Cardiovascular ;regular rate and rhythm with S1-S2 no murmur or gallop, Pulmonary; clear bilaterally, Abdomen; soft she has some minimal epigastric tenderness no guarding or rebound no palpable mass or hepatosplenomegaly bowel sounds are present, Rectal; exam not done, Extremities; no clubbing cyanosis or edema skin warm and dry, Psych ;mood and affect appropriate        Assessment & Plan:  #85  73 year old female with stage IIIB squamous cell lung CA/right lung who completed radiation therapy February 2015 through April 2015 anticipating chemotherapy initiation within the next couple of weeks. Patient with several week history of dysphagia and odynophagia, current to radiation therapy and consistent with radiation esophagitis. She has had some improvement over the past week on PPI therapy. Doubt stricture but cannot rule out #2 weight loss secondary to above, 30 pounds #3 rheumatoid arthritis #4 hypertension  Plan; increase Protonix to 40 mg by mouth twice daily Continue bland diet, advise patient to try eating 5 small meals per day 2 increase her calorie intake and to add ensure or boost high-protein supplements at least twice daily Will schedule for EGD with Dr. Hilarie Fredrickson  later this week for diagnostic purposes. Procedure discussed in detail with the patient and she is agreeable to proceed  Addendum: Reviewed and agree with initial management. Jerene Bears, MD

## 2014-04-10 ENCOUNTER — Encounter: Payer: Self-pay | Admitting: Internal Medicine

## 2014-04-10 ENCOUNTER — Ambulatory Visit (AMBULATORY_SURGERY_CENTER): Payer: Medicare Other | Admitting: Internal Medicine

## 2014-04-10 VITALS — BP 107/72 | HR 81 | Temp 98.2°F | Resp 20 | Ht 66.0 in | Wt 151.0 lb

## 2014-04-10 DIAGNOSIS — K208 Other esophagitis without bleeding: Secondary | ICD-10-CM

## 2014-04-10 DIAGNOSIS — R1310 Dysphagia, unspecified: Secondary | ICD-10-CM

## 2014-04-10 DIAGNOSIS — K222 Esophageal obstruction: Secondary | ICD-10-CM

## 2014-04-10 DIAGNOSIS — IMO0002 Reserved for concepts with insufficient information to code with codable children: Secondary | ICD-10-CM

## 2014-04-10 DIAGNOSIS — T66XXXA Radiation sickness, unspecified, initial encounter: Secondary | ICD-10-CM | POA: Insufficient documentation

## 2014-04-10 MED ORDER — SODIUM CHLORIDE 0.9 % IV SOLN: 500.0000 mL | INTRAVENOUS | Status: DC

## 2014-04-10 NOTE — Progress Notes (Signed)
Pt stable to RR 

## 2014-04-10 NOTE — Patient Instructions (Signed)
Discharge instructions given with verbal understanding. Resume previous medications. YOU HAD AN ENDOSCOPIC PROCEDURE TODAY AT [facility]: Refer to the procedure report that was given to you for any specific questions about what was found during the examination.  If the procedure report does not answer your questions, please call your gastroenterologist to clarify.  If you requested that your care partner not be given the details of your procedure findings, then the procedure report has been included in a sealed envelope for you to review at your convenience later.  YOU SHOULD EXPECT: Some feelings of bloating in the abdomen. Passage of more gas than usual.  Walking can help get rid of the air that was put into your GI tract during the procedure and reduce the bloating. If you had a lower endoscopy (such as a colonoscopy or flexible sigmoidoscopy) you may notice spotting of blood in your stool or on the toilet paper. If you underwent a bowel prep for your procedure, then you may not have a normal bowel movement for a few days.  DIET: Your first meal following the procedure should be a light meal and then it is ok to progress to your normal diet.  A half-sandwich or bowl of soup is an example of a good first meal.  Heavy or fried foods are harder to digest and may make you feel nauseous or bloated.  Likewise meals heavy in dairy and vegetables can cause extra gas to form and this can also increase the bloating.  Drink plenty of fluids but you should avoid alcoholic beverages for 24 hours.  ACTIVITY: Your care partner should take you home directly after the procedure.  You should plan to take it easy, moving slowly for the rest of the day.  You can resume normal activity the day after the procedure however you should NOT DRIVE or use heavy machinery for 24 hours (because of the sedation medicines used during the test).    SYMPTOMS TO REPORT IMMEDIATELY: A gastroenterologist can be reached  at any hour.  During normal business hours, 8:30 AM to 5:00 PM Monday through Friday, call [phone].  After hours and on weekends, please call the GI answering service at [phone] who will take a message and have the physician on call contact you.   Following upper endoscopy (EGD)  Vomiting of blood or coffee ground material  New chest pain or pain under the shoulder blades  Painful or persistently difficult swallowing  New shortness of breath  Fever of 100F or higher  Black, tarry-looking stools  FOLLOW UP: If any biopsies were taken you will be contacted by phone or by letter within the next 1-3 weeks.  Call your gastroenterologist if you have not heard about the biopsies in 3 weeks.  Our staff will call the home number listed on your records the next business day following your procedure to check on you and address any questions or concerns that you may have at that time regarding the information given to you following your procedure. This is a courtesy call and so if there is no answer at the home number and we have not heard from you through the emergency physician on call, we will assume that you have returned to your regular daily activities without incident.  SIGNATURES/CONFIDENTIALITY: You and/or your care partner have signed paperwork which will be entered into your electronic medical record.  These signatures attest to the fact that that the information above on your After Visit Summary  has been reviewed and is understood.  Full responsibility of the confidentiality of this discharge information lies with you and/or your care-partner. 

## 2014-04-10 NOTE — Op Note (Signed)
[employer]  [address] [place], [id number]   ENDOSCOPY PROCEDURE REPORT  PATIENT: Megan [name], Megan [name]  MR#: [id number] BIRTHDATE: May 29, 1941 , 73  yrs. old GENDER: Female ENDOSCOPIST: Jerene Bears, MD PROCEDURE DATE:  04/10/2014 PROCEDURE:  EGD, diagnostic ASA CLASS:     Class III INDICATIONS:  Dysphagia.   Odynophagia.  History of lung cancer status post radiation therapy MEDICATIONS: MAC sedation, administered by CRNA and propofol (Diprivan) 150mg  IV TOPICAL ANESTHETIC: none  DESCRIPTION OF PROCEDURE: After the risks benefits and alternatives of the procedure were thoroughly explained, informed consent was obtained.  The LB [id number] endoscope was introduced through the mouth and advanced to the second portion of the duodenum. Without limitations.  The instrument was slowly withdrawn as the mucosa was fully examined.     ESOPHAGUS: Moderately severe esophagitis was found in the middle third of the esophagus.  There is a focal area of esophagitis beginning 26 cm from the incisors and extending for approximately 3 cm. There was narrowing in this area. This is consistent with radiation esophagitis. The standard adult upper endoscope was able to be advanced across the inflamed and narrowed segment and advanced into the stomach. During inspection as the scope was removed, and a superficial dorsal tear was encountered at the level of narrowing. For this reason further dilation was not performed today   A 5 cm hiatal hernia was noted.  STOMACH: The mucosa of the stomach appeared normal.  DUODENUM: The duodenal mucosa showed no abnormalities in the bulb and second portion of the duodenum.  Retroflexed views revealed a hiatal hernia.     The scope was then withdrawn from the patient and the procedure completed.  COMPLICATIONS: There were no complications.  ENDOSCOPIC IMPRESSION: 1.   Radiation esophagitis and narrowing in the middle third of  the esophagus (26-29 cm from the incisors).  Superficial mucosal tear with passage of the standard adult upper endoscope 2.   5 cm hiatal hernia 3.   The mucosa of the stomach appeared normal 4.   The duodenal mucosa showed no abnormalities in the bulb and second portion of the duodenum  RECOMMENDATIONS: 1.  Continue daily pantoprazole 40 mg before breakfast 2.  Soft diet 3.  Anti-reflux regimen to be followed 4.  Continue liquid sucralfate 1 g 3 times daily before meals and at bedtime 5.  Further dilation can be performed as needed   eSigned:  Jerene Bears, MD 04/10/2014 3:15 PM   CC:The Patient, Gareth Eagle, MD, Curt Bears, MD, and [place], [place] PA-C  PATIENT NAME:  Megan [name], Megan [name] MR#: [id number]

## 2014-04-11 ENCOUNTER — Telehealth: Payer: Self-pay | Admitting: Gastroenterology

## 2014-04-11 ENCOUNTER — Other Ambulatory Visit: Payer: Self-pay | Admitting: Gastroenterology

## 2014-04-11 ENCOUNTER — Telehealth: Payer: Self-pay

## 2014-04-11 DIAGNOSIS — R1310 Dysphagia, unspecified: Secondary | ICD-10-CM

## 2014-04-11 DIAGNOSIS — C3490 Malignant neoplasm of unspecified part of unspecified bronchus or lung: Secondary | ICD-10-CM

## 2014-04-11 MED ORDER — SUCRALFATE 1 GM/10ML PO SUSP
ORAL | Status: DC
Start: 2014-04-11 — End: 2014-07-22

## 2014-04-11 NOTE — Telephone Encounter (Signed)
carafate not called in following EGD.  MEDICINE WAS RENEWED.

## 2014-04-11 NOTE — Telephone Encounter (Signed)
  Follow up Call-  Call back number 04/10/2014  Post procedure Call Back phone  # [phone]  Permission to leave phone message Yes     Patient questions:  Do you have a fever, pain , or abdominal swelling? no Pain Score  0 *  Have you tolerated food without any problems? yes  Have you been able to return to your normal activities? yes  Do you have any questions about your discharge instructions: Diet   no Medications  no Follow up visit  no  Do you have questions or concerns about your Care? no  Actions: * If pain score is 4 or above: No action needed, pain <4.

## 2014-04-15 ENCOUNTER — Ambulatory Visit (HOSPITAL_BASED_OUTPATIENT_CLINIC_OR_DEPARTMENT_OTHER): Payer: Medicare Other

## 2014-04-15 ENCOUNTER — Ambulatory Visit (HOSPITAL_BASED_OUTPATIENT_CLINIC_OR_DEPARTMENT_OTHER): Payer: Medicare Other | Admitting: Internal Medicine

## 2014-04-15 ENCOUNTER — Other Ambulatory Visit (HOSPITAL_BASED_OUTPATIENT_CLINIC_OR_DEPARTMENT_OTHER): Payer: Medicare Other

## 2014-04-15 ENCOUNTER — Telehealth: Payer: Self-pay | Admitting: Internal Medicine

## 2014-04-15 ENCOUNTER — Ambulatory Visit: Payer: Medicare Other | Admitting: Nutrition

## 2014-04-15 ENCOUNTER — Encounter: Payer: Self-pay | Admitting: Internal Medicine

## 2014-04-15 VITALS — BP 141/54 | HR 91 | Temp 97.9°F | Resp 18 | Ht 66.0 in | Wt 151.9 lb

## 2014-04-15 DIAGNOSIS — C342 Malignant neoplasm of middle lobe, bronchus or lung: Secondary | ICD-10-CM

## 2014-04-15 DIAGNOSIS — C3491 Malignant neoplasm of unspecified part of right bronchus or lung: Secondary | ICD-10-CM

## 2014-04-15 DIAGNOSIS — K209 Esophagitis, unspecified without bleeding: Secondary | ICD-10-CM

## 2014-04-15 DIAGNOSIS — E876 Hypokalemia: Secondary | ICD-10-CM

## 2014-04-15 DIAGNOSIS — Z5111 Encounter for antineoplastic chemotherapy: Secondary | ICD-10-CM

## 2014-04-15 DIAGNOSIS — C3490 Malignant neoplasm of unspecified part of unspecified bronchus or lung: Secondary | ICD-10-CM

## 2014-04-15 DIAGNOSIS — Y842 Radiological procedure and radiotherapy as the cause of abnormal reaction of the patient, or of later complication, without mention of misadventure at the time of the procedure: Secondary | ICD-10-CM

## 2014-04-15 LAB — COMPREHENSIVE METABOLIC PANEL (CC13)
ALK PHOS: 91 U/L (ref 40–150)
ALT: 26 U/L (ref 0–55)
AST: 18 U/L (ref 5–34)
Albumin: 3.2 g/dL — ABNORMAL LOW (ref 3.5–5.0)
Anion Gap: 12 mEq/L — ABNORMAL HIGH (ref 3–11)
BILIRUBIN TOTAL: 0.66 mg/dL (ref 0.20–1.20)
BUN: 12.7 mg/dL (ref 7.0–26.0)
CO2: 25 mEq/L (ref 22–29)
Calcium: 9.4 mg/dL (ref 8.4–10.4)
Chloride: 104 mEq/L (ref 98–109)
Creatinine: 0.8 mg/dL (ref 0.6–1.1)
GLUCOSE: 206 mg/dL — AB (ref 70–140)
Potassium: 2.7 mEq/L — CL (ref 3.5–5.1)
Sodium: 141 mEq/L (ref 136–145)
Total Protein: 6.3 g/dL — ABNORMAL LOW (ref 6.4–8.3)

## 2014-04-15 LAB — CBC WITH DIFFERENTIAL/PLATELET
BASO%: 1 % (ref 0.0–2.0)
Basophils Absolute: 0.1 10*3/uL (ref 0.0–0.1)
EOS ABS: 0.2 10*3/uL (ref 0.0–0.5)
EOS%: 3.8 % (ref 0.0–7.0)
HCT: 37.7 % (ref 34.8–46.6)
HGB: 13 g/dL (ref 11.6–15.9)
LYMPH%: 13.2 % — AB (ref 14.0–49.7)
MCH: 33.3 pg (ref 25.1–34.0)
MCHC: 34.4 g/dL (ref 31.5–36.0)
MCV: 96.8 fL (ref 79.5–101.0)
MONO#: 0.5 10*3/uL (ref 0.1–0.9)
MONO%: 8.7 % (ref 0.0–14.0)
NEUT#: 4.3 10*3/uL (ref 1.5–6.5)
NEUT%: 73.3 % (ref 38.4–76.8)
Platelets: 172 10*3/uL (ref 145–400)
RBC: 3.89 10*6/uL (ref 3.70–5.45)
RDW: 13.6 % (ref 11.2–14.5)
WBC: 5.9 10*3/uL (ref 3.9–10.3)
lymph#: 0.8 10*3/uL — ABNORMAL LOW (ref 0.9–3.3)

## 2014-04-15 MED ORDER — ONDANSETRON 16 MG/50ML IVPB (CHCC)
16.0000 mg | Freq: Once | INTRAVENOUS | Status: AC
Start: 2014-04-15 — End: 2014-04-15
  Administered 2014-04-15: 16 mg via INTRAVENOUS

## 2014-04-15 MED ORDER — SODIUM CHLORIDE 0.9 % IV SOLN
Freq: Once | INTRAVENOUS | Status: AC
  Administered 2014-04-15: 10:00:00 via INTRAVENOUS

## 2014-04-15 MED ORDER — FAMOTIDINE IN NACL 20-0.9 MG/50ML-% IV SOLN
20.0000 mg | Freq: Once | INTRAVENOUS | Status: AC
  Administered 2014-04-15: 20 mg via INTRAVENOUS

## 2014-04-15 MED ORDER — SODIUM CHLORIDE 0.9 % IV SOLN
Freq: Once | INTRAVENOUS | Status: AC
  Administered 2014-04-15: 12:00:00 via INTRAVENOUS
  Filled 2014-04-15: qty 100

## 2014-04-15 MED ORDER — SODIUM CHLORIDE 0.9 % IV SOLN
INTRAVENOUS | Status: AC
  Administered 2014-04-15: 11:00:00 via INTRAVENOUS
  Filled 2014-04-15: qty 100

## 2014-04-15 MED ORDER — DIPHENHYDRAMINE HCL 50 MG/ML IJ SOLN
50.0000 mg | Freq: Once | INTRAMUSCULAR | Status: AC
  Administered 2014-04-15: 50 mg via INTRAVENOUS

## 2014-04-15 MED ORDER — FAMOTIDINE IN NACL 20-0.9 MG/50ML-% IV SOLN
INTRAVENOUS | Status: AC
  Filled 2014-04-15: qty 50

## 2014-04-15 MED ORDER — ONDANSETRON 16 MG/50ML IVPB (CHCC)
INTRAVENOUS | Status: AC
  Filled 2014-04-15: qty 16

## 2014-04-15 MED ORDER — PACLITAXEL CHEMO INJECTION 300 MG/50ML
175.0000 mg/m2 | Freq: Once | INTRAVENOUS | Status: AC
  Administered 2014-04-15: 318 mg via INTRAVENOUS
  Filled 2014-04-15: qty 53

## 2014-04-15 MED ORDER — DEXAMETHASONE SODIUM PHOSPHATE 20 MG/5ML IJ SOLN
INTRAMUSCULAR | Status: AC
  Filled 2014-04-15: qty 5

## 2014-04-15 MED ORDER — DIPHENHYDRAMINE HCL 50 MG/ML IJ SOLN
INTRAMUSCULAR | Status: AC
  Filled 2014-04-15: qty 1

## 2014-04-15 MED ORDER — SODIUM CHLORIDE 0.9 % IV SOLN
411.0000 mg | Freq: Once | INTRAVENOUS | Status: AC
  Administered 2014-04-15: 410 mg via INTRAVENOUS
  Filled 2014-04-15: qty 41

## 2014-04-15 MED ORDER — DEXAMETHASONE SODIUM PHOSPHATE 20 MG/5ML IJ SOLN
20.0000 mg | Freq: Once | INTRAMUSCULAR | Status: AC
  Administered 2014-04-15: 20 mg via INTRAVENOUS

## 2014-04-15 NOTE — Telephone Encounter (Signed)
gv and printed appt sched and avs for pt for June and July....Marland Kitchensed added tx.

## 2014-04-15 NOTE — Progress Notes (Signed)
I received a phone call from nursing requesting nutrition followup for patient during chemotherapy.  I saw patient in March of 2015.  At that time, patient weighed 164.2 pounds March 26.  She is recovering from radiation esophagitis. She continues to have some difficulty swallowing however that has improved.  She is receiving consolidation chemotherapy.  Patient reports diarrhea has resolved.  She is trying to eat blander foods more often.  Current weight documented as 151.9 pounds June 16.  Potassium noted 2.7.  Nutrition diagnosis: Unintended weight loss continues.  Intervention: Patient again educated on strategies for adding calories and protein throughout the day in frequent small meals and snacks.  Recommended patient strive for weight maintenance.  Reinforced No concentrated sweets diet for improved glycemic control.  Recommended patient add oral nutrition supplements twice a day.  Provided fact sheets on strategies for increasing calories and protein, and a fact sheet on high potassium foods.  Questions were answered.  Teach back method used.  Monitoring, evaluation, goals: Patient has been unable to tolerate adequate calories and protein, and has had continued weight loss.  Next visit:Tuesday, July 7, during chemotherapy.

## 2014-04-15 NOTE — Patient Instructions (Signed)
[facility] Discharge Instructions for Patients Receiving Chemotherapy  Today you received the following chemotherapy agents: Taxol, Carboplatin   To help prevent nausea and vomiting after your treatment, we encourage you to take your nausea medication as prescribed.    If you develop nausea and vomiting that is not controlled by your nausea medication, call the clinic.   BELOW ARE SYMPTOMS THAT SHOULD BE REPORTED IMMEDIATELY:  *FEVER GREATER THAN 100.5 F  *CHILLS WITH OR WITHOUT FEVER  NAUSEA AND VOMITING THAT IS NOT CONTROLLED WITH YOUR NAUSEA MEDICATION  *UNUSUAL SHORTNESS OF BREATH  *UNUSUAL BRUISING OR BLEEDING  TENDERNESS IN MOUTH AND THROAT WITH OR WITHOUT PRESENCE OF ULCERS  *URINARY PROBLEMS  *BOWEL PROBLEMS  UNUSUAL RASH Items with * indicate a potential emergency and should be followed up as soon as possible.  Feel free to call the clinic you have any questions or concerns. The clinic phone number is [phone].

## 2014-04-15 NOTE — Progress Notes (Signed)
[facility] Telephone:([phone]   Fax:([phone]) [phone]  OFFICE PROGRESS NOTE  [name], MD [address] [place] [address] [place] [address]  DIAGNOSIS: Squamous cell lung cancer  Primary site: Lung (Right)  Staging method: AJCC 7th Edition  Clinical: Stage IIIB (T3, N3, M0)  Summary: Stage IIIB (T3, N3, M0)   PRIOR THERAPY: Concurrent chemoradiation with weekly chemotherapy in the form of carboplatin for AUC of 2 and paclitaxel 45 mg/m2. Status post 7 week of treatment.  Last dose was given 01/27/2014 no significant response in her disease.  CURRENT THERAPY: Consolidation chemotherapy with carboplatin for AUC of 5 and paclitaxel 175 mg/M2 every 3 weeks with Neulasta support. First dose on 04/15/2014.  CHEMOTHERAPY INTENT: Control/Palliative  CURRENT # OF CHEMOTHERAPY CYCLES: 1 CURRENT ANTIEMETICS: Zofran, dexamethasone, Compazine  CURRENT SMOKING STATUS: Former smoker, quit 12/05/1997  ORAL CHEMOTHERAPY AND CONSENT: n/a  CURRENT BISPHOSPHONATES USE: None  PAIN MANAGEMENT: no cancer related pain, patient does have RA  NARCOTICS INDUCED CONSTIPATION: none  LIVING WILL AND CODE STATUS: ?   INTERVAL HISTORY: [employer] 73 y.o. female returns to the clinic today for followup visit accompanied by a friend. She was seen recently by a gastroenterologist Dr. Hilarie Fredrickson and underwent upper endoscopy. It showed radiation esophagitis with narrowing in the middle third of the esophagus. There was also 5 CM hiatal hernia. He recommended for the patient to start treatment with Protonix 40 mg by mouth daily in addition to soft diet and to continue her treatment with Carafate. She is feeling much better today and she would like to resume her systemic chemotherapy that was delayed from few weeks ago. The patient denied having any significant weight loss or night sweats. She has no nausea or vomiting. She has no fever or chills. She denied having any significant  chest pain but continues to have shortness breath with exertion with no cough or hemoptysis.  MEDICAL HISTORY: Past Medical History  Diagnosis Date  . Hypertension   . Diabetes mellitus   . Rheumatoid arthritis   . GERD (gastroesophageal reflux disease)     no meds for  . Hx of radiation therapy 12/16/13-01/30/14    lung 66Gy  . Cancer     LUNG    ALLERGIES:  is allergic to remicade.  MEDICATIONS:  Current Outpatient Prescriptions  Medication Sig Dispense Refill  . losartan-hydrochlorothiazide (HYZAAR) 100-25 MG per tablet Take 1 tablet by mouth daily.      . pantoprazole (PROTONIX) 40 MG tablet Take 1 tablet (40 mg total) by mouth daily.  60 tablet  0  . prochlorperazine (COMPAZINE) 10 MG tablet Take 1 tablet (10 mg total) by mouth every 6 (six) hours as needed for nausea or vomiting.  60 tablet  0  . sucralfate (CARAFATE) 1 GM/10ML suspension Take 10 mL by mouth 4 times daily before meals.  240 mL  0   No current facility-administered medications for this visit.   Facility-Administered Medications Ordered in Other Visits  Medication Dose Route Frequency Provider Last Rate Last Dose  . sodium chloride 0.9 % injection 10 mL  10 mL Intracatheter PRN Curt Bears, MD        SURGICAL HISTORY:  Past Surgical History  Procedure Laterality Date  . Cholecystectomy    . Abdominal hysterectomy    . Dilation and curettage of uterus    . Abdominal surgery      REVIEW OF SYSTEMS:  Constitutional: negative Eyes: negative Ears, nose, mouth,  throat, and face: negative Respiratory: negative Cardiovascular: negative Gastrointestinal: positive for dyspepsia Genitourinary:negative Integument/breast: negative Hematologic/lymphatic: negative Musculoskeletal:negative Neurological: negative Behavioral/Psych: negative Endocrine: negative Allergic/Immunologic: negative   PHYSICAL EXAMINATION: General appearance: alert, cooperative and no distress Head: Normocephalic, without obvious  abnormality, atraumatic Neck: no adenopathy, no JVD, supple, symmetrical, trachea midline and thyroid not enlarged, symmetric, no tenderness/mass/nodules Lymph nodes: Cervical, supraclavicular, and axillary nodes normal. Resp: clear to auscultation bilaterally Back: symmetric, no curvature. ROM normal. No CVA tenderness. Cardio: regular rate and rhythm, S1, S2 normal, no murmur, click, rub or gallop GI: soft, non-tender; bowel sounds normal; no masses,  no organomegaly Extremities: extremities normal, atraumatic, no cyanosis or edema Neurologic: Alert and oriented X 3, normal strength and tone. Normal symmetric reflexes. Normal coordination and gait  ECOG PERFORMANCE STATUS: 1 - Symptomatic but completely ambulatory  Blood pressure 141/54, pulse 91, temperature 97.9 F (36.6 C), temperature source Oral, resp. rate 18, height 5\' 6"  (1.676 m), weight 151 lb 14.4 oz (68.901 kg).  LABORATORY DATA: Lab Results  Component Value Date   WBC 5.9 04/15/2014   HGB 13.0 04/15/2014   HCT 37.7 04/15/2014   MCV 96.8 04/15/2014   PLT 172 04/15/2014      Chemistry      Component Value Date/Time   NA 141 04/15/2014 0825   K 2.7* 04/15/2014 0825   CO2 25 04/15/2014 0825   BUN 12.7 04/15/2014 0825   CREATININE 0.8 04/15/2014 0825      Component Value Date/Time   CALCIUM 9.4 04/15/2014 0825   ALKPHOS 91 04/15/2014 0825   AST 18 04/15/2014 0825   ALT 26 04/15/2014 0825   BILITOT 0.66 04/15/2014 0825       RADIOGRAPHIC STUDIES:  ASSESSMENT AND PLAN: This is a very pleasant 73 years old white female with history of stage IIIB non-small cell lung cancer status post a course of concurrent chemoradiation with weekly carboplatin and paclitaxel status post 7 cycles. The patient tolerated her treatment fairly well with no significant adverse effects except for the radiation induced esophagitis.  She was recently seen by gastroenterology and was found to have radiation induced esophagitis and was started on  treatment with Protonix in addition to Carafate. I have a lengthy discussion with the patient today about her condition and again I offered her the option of continuous observation and close monitoring with repeat scan versus proceeding with the consolidation chemotherapy. She is interested in proceeding with chemotherapy and she will start today the first cycle of her treatment was carboplatin for AUC of 5 and paclitaxel 175 mg/M2 every 3 weeks with Neulasta. I reminded her with the adverse effect of this treatment including but not limited to alopecia, myelosuppression, nausea and vomiting, peripheral neuropathy, liver or renal dysfunction. She would come back for followup visit in 3 weeks for reevaluation.  She was advised to call immediately if she has any concerning symptoms in the interval. The patient voices understanding of current disease status and treatment options and is in agreement with the current care plan.  All questions were answered. The patient knows to call the clinic with any problems, questions or concerns. We can certainly see the patient much sooner if necessary. I spent 15 minutes of face-to-face counseling with the patient and her friend out of the total visit time 25 minutes.  Disclaimer: This note was dictated with voice recognition software. Similar sounding words can inadvertently be transcribed and may not be corrected upon review.

## 2014-04-16 ENCOUNTER — Ambulatory Visit (HOSPITAL_BASED_OUTPATIENT_CLINIC_OR_DEPARTMENT_OTHER): Payer: Medicare Other

## 2014-04-16 VITALS — BP 121/68 | HR 87 | Temp 97.5°F

## 2014-04-16 DIAGNOSIS — C342 Malignant neoplasm of middle lobe, bronchus or lung: Secondary | ICD-10-CM

## 2014-04-16 DIAGNOSIS — C3490 Malignant neoplasm of unspecified part of unspecified bronchus or lung: Secondary | ICD-10-CM

## 2014-04-16 DIAGNOSIS — Z5189 Encounter for other specified aftercare: Secondary | ICD-10-CM

## 2014-04-16 MED ORDER — PEGFILGRASTIM INJECTION 6 MG/0.6ML
6.0000 mg | Freq: Once | SUBCUTANEOUS | Status: AC
  Administered 2014-04-16: 6 mg via SUBCUTANEOUS
  Filled 2014-04-16: qty 0.6

## 2014-04-16 NOTE — Patient Instructions (Signed)

## 2014-04-22 ENCOUNTER — Other Ambulatory Visit (HOSPITAL_BASED_OUTPATIENT_CLINIC_OR_DEPARTMENT_OTHER): Payer: Medicare Other

## 2014-04-22 DIAGNOSIS — C770 Secondary and unspecified malignant neoplasm of lymph nodes of head, face and neck: Secondary | ICD-10-CM

## 2014-04-22 DIAGNOSIS — C3491 Malignant neoplasm of unspecified part of right bronchus or lung: Secondary | ICD-10-CM

## 2014-04-22 DIAGNOSIS — C342 Malignant neoplasm of middle lobe, bronchus or lung: Secondary | ICD-10-CM

## 2014-04-22 LAB — CBC WITH DIFFERENTIAL/PLATELET
BASO%: 0.7 % (ref 0.0–2.0)
Basophils Absolute: 0.1 10*3/uL (ref 0.0–0.1)
EOS%: 5.1 % (ref 0.0–7.0)
Eosinophils Absolute: 0.5 10*3/uL (ref 0.0–0.5)
HCT: 38.7 % (ref 34.8–46.6)
HGB: 12.9 g/dL (ref 11.6–15.9)
LYMPH%: 10.1 % — ABNORMAL LOW (ref 14.0–49.7)
MCH: 32.8 pg (ref 25.1–34.0)
MCHC: 33.5 g/dL (ref 31.5–36.0)
MCV: 98.1 fL (ref 79.5–101.0)
MONO#: 1 10*3/uL — AB (ref 0.1–0.9)
MONO%: 10.7 % (ref 0.0–14.0)
NEUT#: 6.9 10*3/uL — ABNORMAL HIGH (ref 1.5–6.5)
NEUT%: 73.4 % (ref 38.4–76.8)
PLATELETS: 147 10*3/uL (ref 145–400)
RBC: 3.94 10*6/uL (ref 3.70–5.45)
RDW: 13.4 % (ref 11.2–14.5)
WBC: 9.5 10*3/uL (ref 3.9–10.3)
lymph#: 1 10*3/uL (ref 0.9–3.3)

## 2014-04-22 LAB — COMPREHENSIVE METABOLIC PANEL
ALT: 33 U/L (ref 0–35)
AST: 22 U/L (ref 0–37)
Albumin: 3.4 g/dL — ABNORMAL LOW (ref 3.5–5.2)
Alkaline Phosphatase: 108 U/L (ref 39–117)
BUN: 22 mg/dL (ref 6–23)
CO2: 24 meq/L (ref 19–32)
Calcium: 9.1 mg/dL (ref 8.4–10.5)
Chloride: 102 mEq/L (ref 96–112)
Creatinine, Ser: 1.35 mg/dL — ABNORMAL HIGH (ref 0.50–1.10)
GLUCOSE: 182 mg/dL — AB (ref 70–99)
Potassium: 3.4 mEq/L — ABNORMAL LOW (ref 3.5–5.3)
SODIUM: 142 meq/L (ref 135–145)
TOTAL PROTEIN: 6.5 g/dL (ref 6.0–8.3)
Total Bilirubin: 0.3 mg/dL (ref 0.2–1.2)

## 2014-04-28 ENCOUNTER — Encounter: Payer: Self-pay | Admitting: Internal Medicine

## 2014-04-28 ENCOUNTER — Ambulatory Visit (INDEPENDENT_AMBULATORY_CARE_PROVIDER_SITE_OTHER): Payer: Medicare Other | Admitting: Internal Medicine

## 2014-04-28 VITALS — BP 130/82 | HR 100 | Ht 66.0 in | Wt 153.6 lb

## 2014-04-28 DIAGNOSIS — R0600 Dyspnea, unspecified: Secondary | ICD-10-CM | POA: Insufficient documentation

## 2014-04-28 DIAGNOSIS — R0989 Other specified symptoms and signs involving the circulatory and respiratory systems: Secondary | ICD-10-CM

## 2014-04-28 DIAGNOSIS — R0609 Other forms of dyspnea: Secondary | ICD-10-CM

## 2014-04-28 DIAGNOSIS — R0602 Shortness of breath: Secondary | ICD-10-CM | POA: Insufficient documentation

## 2014-04-28 NOTE — Progress Notes (Signed)
Subjective:    Patient ID: Megan [name], female    DOB: 08/24/1941, 73 y.o.   MRN: [id number]  HPI  DIAGNOSIS: Squamous cell lung cancer  Primary site: Lung (Right)  Staging method: AJCC 7th Edition  Clinical: Stage IIIB (T3, N3, M0)  Summary: Stage IIIB (T3, N3, M0)  PRIOR THERAPY: Concurrent chemoradiation with weekly chemotherapy in the form of carboplatin for AUC of 2 and paclitaxel 45 mg/m2. Status post 7 week of treatment. Last dose was given 01/27/2014 no significant response in her disease.  CURRENT THERAPY: Consolidation chemotherapy with carboplatin for AUC of 5 and paclitaxel 175 mg/M2 every 3 weeks with Neulasta support. First dose on 04/15/2014.  CHEMOTHERAPY INTENT: Control/Palliative  CURRENT # OF CHEMOTHERAPY CYCLES: 1  CURRENT ANTIEMETICS: Zofran, dexamethasone, Compazine  CURRENT SMOKING STATUS: Former smoker, quit 12/05/1997  ORAL CHEMOTHERAPY AND CONSENT: n/a  CURRENT BISPHOSPHONATES USE: None  PAIN MANAGEMENT: no cancer related pain, patient does have RA  NARCOTICS INDUCED CONSTIPATION: none  LIVING WILL AND CODE STATUS: ?   OV 04/28/2014  Chief Complaint  Patient presents with  . Follow-up    c/o: sob, wheezing and cough, reports breathing is worse then last ov    I have not seen her since she was diagnosed with lung cancer. She underwent concurrent chemotherapy and radiation with only limited response. Course was also complicated by significant radiation esophagitis. She seems to be slowly recovering from that although for the GI workup is in progress. She is now under new chemotherapy regimen needed to start her second cycle 05/06/2014. She is describing insidious onset of mild to moderate chronic dry cough since her diagnosis of lung cancer. The chemotherapy has not helped with cough. 02/28/2014 CT scan of the chest showed decreasing lung mass but despite this cough is the same. In addition she also has insidious onset of dyspnea on exertion for  moderate activities and relieved by rest without any associated chest pain. Pulmonary function test pre-chemotherapy showed only isolated reduction in diffusion capacity. She's not taking any inhalers. She has not been to pulmonary rehabilitation. She is willing to try inhalers but only after strongly counseled her to. Overall chemotherapy is leaving her  exhausted but the ECoG is still good at 0-1  Review of Systems  Constitutional: Negative for fever and unexpected weight change.  HENT: Positive for congestion. Negative for dental problem, ear pain, nosebleeds, postnasal drip, rhinorrhea, sinus pressure, sneezing, sore throat and trouble swallowing.   Eyes: Negative for redness and itching.  Respiratory: Positive for cough, shortness of breath and wheezing. Negative for chest tightness.   Cardiovascular: Positive for leg swelling. Negative for palpitations.  Gastrointestinal: Negative for nausea and vomiting.  Genitourinary: Negative for dysuria.  Musculoskeletal: Negative for joint swelling.  Skin: Negative for rash.  Neurological: Negative for headaches.  Hematological: Bruises/bleeds easily.  Psychiatric/Behavioral: Negative for dysphoric mood. The patient is nervous/anxious.        Objective:   Physical Exam  Vitals reviewed. Constitutional: She is oriented to person, place, and time. She appears well-developed and well-nourished. No distress.  HENT:  Head: Normocephalic and atraumatic.  Right Ear: External ear normal.  Left Ear: External ear normal.  Mouth/Throat: Oropharynx is clear and moist. No oropharyngeal exudate.  Wig +   Eyes: Conjunctivae and EOM are normal. Pupils are equal, round, and reactive to light. Right eye exhibits no discharge. Left eye exhibits no discharge. No scleral icterus.  Neck: Normal range of motion. Neck supple. No JVD present. No  tracheal deviation present. No thyromegaly present.  Cardiovascular: Normal rate, regular rhythm, normal heart sounds and  intact distal pulses.  Exam reveals no gallop and no friction rub.   No murmur heard. Pulmonary/Chest: Effort normal and breath sounds normal. No respiratory distress. She has no wheezes. She has no rales. She exhibits no tenderness.  Abdominal: Soft. Bowel sounds are normal. She exhibits no distension and no mass. There is no tenderness. There is no rebound and no guarding.  Musculoskeletal: Normal range of motion. She exhibits no edema and no tenderness.  Lymphadenopathy:    She has no cervical adenopathy.  Neurological: She is alert and oriented to person, place, and time. She has normal reflexes. No cranial nerve deficit. She exhibits normal muscle tone. Coordination normal.  Skin: Skin is warm and dry. No rash noted. She is not diaphoretic. No erythema. No pallor.  Psychiatric: She has a normal mood and affect. Her behavior is normal. Judgment and thought content normal.    Filed Vitals:   04/28/14 1515  BP: 130/82  Pulse: 100  Height: 5\' 6"  (1.676 m)  Weight: 153 lb 9.6 oz (69.673 kg)  SpO2: 96%         Assessment & Plan:  SHortness of breath  - could be due to cancer fatigue, lung mass, chemo and mild basal emphysema  - try advair 100/50 1 puff twice daily wihtout fail; learn technique  - refer pulmonary rehab on basis of lung cancer; start in 1-2 months after chemo   Followup  4 weeks to see me or NP to see response

## 2014-04-28 NOTE — Patient Instructions (Signed)
SHortness of breath  - could be due to cancer fatigue, lung mass, chemo and mild basal emphysema  - try advair 100/50 1 puff twice daily wihtout fail; learn technique  - refer pulmonary rehab on basis of lung cancer; start in 1-2 months after chemo   Followup  4 weeks to see me or NP to see response

## 2014-04-29 ENCOUNTER — Other Ambulatory Visit (HOSPITAL_BASED_OUTPATIENT_CLINIC_OR_DEPARTMENT_OTHER): Payer: Medicare Other

## 2014-04-29 DIAGNOSIS — C770 Secondary and unspecified malignant neoplasm of lymph nodes of head, face and neck: Secondary | ICD-10-CM

## 2014-04-29 DIAGNOSIS — C342 Malignant neoplasm of middle lobe, bronchus or lung: Secondary | ICD-10-CM

## 2014-04-29 DIAGNOSIS — C3491 Malignant neoplasm of unspecified part of right bronchus or lung: Secondary | ICD-10-CM

## 2014-04-29 LAB — COMPREHENSIVE METABOLIC PANEL (CC13)
ALBUMIN: 3.2 g/dL — AB (ref 3.5–5.0)
ALT: 19 U/L (ref 0–55)
ANION GAP: 10 meq/L (ref 3–11)
AST: 14 U/L (ref 5–34)
Alkaline Phosphatase: 99 U/L (ref 40–150)
BUN: 11.6 mg/dL (ref 7.0–26.0)
CALCIUM: 8.9 mg/dL (ref 8.4–10.4)
CO2: 25 meq/L (ref 22–29)
Chloride: 107 mEq/L (ref 98–109)
Creatinine: 0.8 mg/dL (ref 0.6–1.1)
GLUCOSE: 168 mg/dL — AB (ref 70–140)
POTASSIUM: 3.3 meq/L — AB (ref 3.5–5.1)
Sodium: 142 mEq/L (ref 136–145)
Total Bilirubin: 0.32 mg/dL (ref 0.20–1.20)
Total Protein: 6.2 g/dL — ABNORMAL LOW (ref 6.4–8.3)

## 2014-04-29 LAB — CBC WITH DIFFERENTIAL/PLATELET
BASO%: 0.8 % (ref 0.0–2.0)
Basophils Absolute: 0.1 10*3/uL (ref 0.0–0.1)
EOS ABS: 0.2 10*3/uL (ref 0.0–0.5)
EOS%: 2.7 % (ref 0.0–7.0)
HCT: 33.1 % — ABNORMAL LOW (ref 34.8–46.6)
HEMOGLOBIN: 11.1 g/dL — AB (ref 11.6–15.9)
LYMPH%: 9.2 % — AB (ref 14.0–49.7)
MCH: 32.8 pg (ref 25.1–34.0)
MCHC: 33.7 g/dL (ref 31.5–36.0)
MCV: 97.5 fL (ref 79.5–101.0)
MONO#: 0.6 10*3/uL (ref 0.1–0.9)
MONO%: 7 % (ref 0.0–14.0)
NEUT%: 80.3 % — ABNORMAL HIGH (ref 38.4–76.8)
NEUTROS ABS: 7.2 10*3/uL — AB (ref 1.5–6.5)
PLATELETS: 135 10*3/uL — AB (ref 145–400)
RBC: 3.39 10*6/uL — AB (ref 3.70–5.45)
RDW: 13.8 % (ref 11.2–14.5)
WBC: 9 10*3/uL (ref 3.9–10.3)
lymph#: 0.8 10*3/uL — ABNORMAL LOW (ref 0.9–3.3)

## 2014-05-03 NOTE — Assessment & Plan Note (Signed)
SHortness of breath  - could be due to cancer fatigue, lung mass, chemo and mild basal emphysema  - try advair 100/50 1 puff twice daily wihtout fail; learn technique  - refer pulmonary rehab on basis of lung cancer; start in 1-2 months after chemo   Followup  4 weeks to see me or NP to see response

## 2014-05-06 ENCOUNTER — Encounter: Payer: Self-pay | Admitting: Physician Assistant

## 2014-05-06 ENCOUNTER — Ambulatory Visit (HOSPITAL_BASED_OUTPATIENT_CLINIC_OR_DEPARTMENT_OTHER): Payer: Medicare Other | Admitting: Physician Assistant

## 2014-05-06 ENCOUNTER — Telehealth: Payer: Self-pay | Admitting: Internal Medicine

## 2014-05-06 ENCOUNTER — Ambulatory Visit (HOSPITAL_BASED_OUTPATIENT_CLINIC_OR_DEPARTMENT_OTHER): Payer: Medicare Other

## 2014-05-06 ENCOUNTER — Other Ambulatory Visit (HOSPITAL_BASED_OUTPATIENT_CLINIC_OR_DEPARTMENT_OTHER): Payer: Medicare Other

## 2014-05-06 ENCOUNTER — Ambulatory Visit: Payer: Medicare Other | Admitting: Nutrition

## 2014-05-06 VITALS — BP 95/60 | HR 96 | Temp 97.0°F | Resp 18 | Ht 66.0 in | Wt 155.0 lb

## 2014-05-06 VITALS — BP 118/57 | HR 88 | Resp 16

## 2014-05-06 DIAGNOSIS — C3491 Malignant neoplasm of unspecified part of right bronchus or lung: Secondary | ICD-10-CM

## 2014-05-06 DIAGNOSIS — Z5111 Encounter for antineoplastic chemotherapy: Secondary | ICD-10-CM

## 2014-05-06 DIAGNOSIS — C342 Malignant neoplasm of middle lobe, bronchus or lung: Secondary | ICD-10-CM

## 2014-05-06 DIAGNOSIS — C3490 Malignant neoplasm of unspecified part of unspecified bronchus or lung: Secondary | ICD-10-CM

## 2014-05-06 LAB — CBC WITH DIFFERENTIAL/PLATELET
BASO%: 3 % — AB (ref 0.0–2.0)
Basophils Absolute: 0.2 10*3/uL — ABNORMAL HIGH (ref 0.0–0.1)
EOS%: 2.2 % (ref 0.0–7.0)
Eosinophils Absolute: 0.2 10*3/uL (ref 0.0–0.5)
HCT: 33.7 % — ABNORMAL LOW (ref 34.8–46.6)
HGB: 11.4 g/dL — ABNORMAL LOW (ref 11.6–15.9)
LYMPH#: 0.8 10*3/uL — AB (ref 0.9–3.3)
LYMPH%: 10.8 % — AB (ref 14.0–49.7)
MCH: 33.1 pg (ref 25.1–34.0)
MCHC: 33.8 g/dL (ref 31.5–36.0)
MCV: 97.7 fL (ref 79.5–101.0)
MONO#: 0.7 10*3/uL (ref 0.1–0.9)
MONO%: 10 % (ref 0.0–14.0)
NEUT#: 5.2 10*3/uL (ref 1.5–6.5)
NEUT%: 74 % (ref 38.4–76.8)
Platelets: 212 10*3/uL (ref 145–400)
RBC: 3.45 10*6/uL — AB (ref 3.70–5.45)
RDW: 14.3 % (ref 11.2–14.5)
WBC: 7 10*3/uL (ref 3.9–10.3)

## 2014-05-06 LAB — COMPREHENSIVE METABOLIC PANEL (CC13)
ALT: 25 U/L (ref 0–55)
ANION GAP: 10 meq/L (ref 3–11)
AST: 17 U/L (ref 5–34)
Albumin: 3.2 g/dL — ABNORMAL LOW (ref 3.5–5.0)
Alkaline Phosphatase: 89 U/L (ref 40–150)
BILIRUBIN TOTAL: 0.51 mg/dL (ref 0.20–1.20)
BUN: 10.8 mg/dL (ref 7.0–26.0)
CHLORIDE: 106 meq/L (ref 98–109)
CO2: 23 meq/L (ref 22–29)
Calcium: 9.4 mg/dL (ref 8.4–10.4)
Creatinine: 0.9 mg/dL (ref 0.6–1.1)
Glucose: 218 mg/dl — ABNORMAL HIGH (ref 70–140)
Potassium: 4 mEq/L (ref 3.5–5.1)
Sodium: 140 mEq/L (ref 136–145)
TOTAL PROTEIN: 6.2 g/dL — AB (ref 6.4–8.3)

## 2014-05-06 MED ORDER — LORAZEPAM 2 MG/ML IJ SOLN
0.5000 mg | Freq: Once | INTRAMUSCULAR | Status: AC
  Administered 2014-05-06: 0.5 mg via INTRAVENOUS

## 2014-05-06 MED ORDER — DIPHENHYDRAMINE HCL 50 MG/ML IJ SOLN
INTRAMUSCULAR | Status: AC
  Filled 2014-05-06: qty 1

## 2014-05-06 MED ORDER — LORAZEPAM 2 MG/ML IJ SOLN
INTRAMUSCULAR | Status: AC
  Filled 2014-05-06: qty 1

## 2014-05-06 MED ORDER — FAMOTIDINE IN NACL 20-0.9 MG/50ML-% IV SOLN
20.0000 mg | Freq: Once | INTRAVENOUS | Status: AC
  Administered 2014-05-06: 20 mg via INTRAVENOUS

## 2014-05-06 MED ORDER — ONDANSETRON 16 MG/50ML IVPB (CHCC)
INTRAVENOUS | Status: AC
  Filled 2014-05-06: qty 16

## 2014-05-06 MED ORDER — DEXAMETHASONE SODIUM PHOSPHATE 20 MG/5ML IJ SOLN
INTRAMUSCULAR | Status: AC
  Filled 2014-05-06: qty 5

## 2014-05-06 MED ORDER — DEXAMETHASONE SODIUM PHOSPHATE 20 MG/5ML IJ SOLN
20.0000 mg | Freq: Once | INTRAMUSCULAR | Status: AC
  Administered 2014-05-06: 20 mg via INTRAVENOUS

## 2014-05-06 MED ORDER — FAMOTIDINE IN NACL 20-0.9 MG/50ML-% IV SOLN
INTRAVENOUS | Status: AC
  Filled 2014-05-06: qty 50

## 2014-05-06 MED ORDER — PACLITAXEL CHEMO INJECTION 300 MG/50ML
175.0000 mg/m2 | Freq: Once | INTRAVENOUS | Status: AC
  Administered 2014-05-06: 318 mg via INTRAVENOUS
  Filled 2014-05-06: qty 53

## 2014-05-06 MED ORDER — METHYLPREDNISOLONE SODIUM SUCC 125 MG IJ SOLR
125.0000 mg | Freq: Once | INTRAMUSCULAR | Status: AC | PRN
Start: 2014-05-06 — End: 2014-05-06
  Administered 2014-05-06: 125 mg via INTRAVENOUS

## 2014-05-06 MED ORDER — CARBOPLATIN CHEMO INJECTION 600 MG/60ML
411.0000 mg | Freq: Once | INTRAVENOUS | Status: AC
  Administered 2014-05-06: 410 mg via INTRAVENOUS
  Filled 2014-05-06: qty 41

## 2014-05-06 MED ORDER — ONDANSETRON 16 MG/50ML IVPB (CHCC)
16.0000 mg | Freq: Once | INTRAVENOUS | Status: AC
  Administered 2014-05-06: 16 mg via INTRAVENOUS

## 2014-05-06 MED ORDER — DIPHENHYDRAMINE HCL 50 MG/ML IJ SOLN
50.0000 mg | Freq: Once | INTRAMUSCULAR | Status: AC
  Administered 2014-05-06: 50 mg via INTRAVENOUS

## 2014-05-06 MED ORDER — SODIUM CHLORIDE 0.9 % IV SOLN
Freq: Once | INTRAVENOUS | Status: AC
  Administered 2014-05-06: 11:00:00 via INTRAVENOUS

## 2014-05-06 NOTE — Patient Instructions (Signed)
[facility] Discharge Instructions for Patients Receiving Chemotherapy  Today you received the following chemotherapy agents taxol/carboplatin  To help prevent nausea and vomiting after your treatment, we encourage you to take your nausea medication as directed   If you develop nausea and vomiting that is not controlled by your nausea medication, call the clinic.   BELOW ARE SYMPTOMS THAT SHOULD BE REPORTED IMMEDIATELY:  *FEVER GREATER THAN 100.5 F  *CHILLS WITH OR WITHOUT FEVER  NAUSEA AND VOMITING THAT IS NOT CONTROLLED WITH YOUR NAUSEA MEDICATION  *UNUSUAL SHORTNESS OF BREATH  *UNUSUAL BRUISING OR BLEEDING  TENDERNESS IN MOUTH AND THROAT WITH OR WITHOUT PRESENCE OF ULCERS  *URINARY PROBLEMS  *BOWEL PROBLEMS  UNUSUAL RASH Items with * indicate a potential emergency and should be followed up as soon as possible.  Feel free to call the clinic you have any questions or concerns. The clinic phone number is [phone].  

## 2014-05-06 NOTE — Progress Notes (Signed)
Approximately 10 minutes into carboplatin infusion pt c/o feeling clammy and dizzy.  Carboplatin paused immediately at approximately 1425.  Pt began vomiting and became flushed and diaphoretic by appearance.  Dr Julien Nordmann notified by Etta Grandchild, RN [phone].  RN instructed to administer 0.5mg  ativan IV and hold carboplatin.  IV flushed with NS wide opened, and ativan given 1430.  Awilda Metro, PAC to bedside approximately 1432.  Pt continued to vomit, was hypotensive, and 02 sats down to 89% RA.  125mg  Solumedrol administered at 1434 per Adrena.  2L O2 applied to patient at this time, and NS kept wide opened per PIV.  Approximately 1440 BP stabalized, Sats 93% on 2L O2, and pt no longer vomiting.  Pt kept comfortable and under close monitoring with frequent vital signs until approximately 1520.  Dr Julien Nordmann notified of patient's stable condition.  Dr. Julien Nordmann to bedside, informed pt ok to discharge to home, will rechallenge with carbo test dose at next appointment.  Pt verbalized understanding, VSS, no apparent distress.  Pt discharged to home with husband.  (see flowsheet for vitals)

## 2014-05-06 NOTE — Progress Notes (Signed)
Followup completed with patient.  Weight increased and documented as 155 pounds July 7.  Patient reports improving radiation esophagitis.  Difficulty swallowing continues but has improved.  Patient beginning to incorporate increase variety in her diet.  Nutrition diagnosis: Unintended weight loss improved.  Intervention: Patient encouraged to continue strategies for increased calories and protein throughout the day.  Patient to continue oral nutrition supplements with decreased appetite.  Patient goal is weight maintenance.  Questions answered.  Teach back method used.  Monitoring, evaluation, goals: Patient will tolerate adequate calories and protein to promote weight maintenance.  Next visit:  Tuesday, July 28, during chemotherapy.

## 2014-05-06 NOTE — Progress Notes (Addendum)
[place] Telephone:([phone]) [phone]   Fax:(336) [phone]  SHARED VISIT PROGRESS NOTE  [name], MD [address] [place] [address]  DIAGNOSIS: Squamous cell lung cancer  Primary site: Lung (Right)  Staging method: AJCC 7th Edition  Clinical: Stage IIIB (T3, N3, M0)  Summary: Stage IIIB (T3, N3, M0)   PRIOR THERAPY: Concurrent chemoradiation with weekly chemotherapy in the form of carboplatin for AUC of 2 and paclitaxel 45 mg/m2. Status post 7 week of treatment.  Last dose was given 01/27/2014 no significant response in her disease.  CURRENT THERAPY: Consolidation chemotherapy with carboplatin for AUC of 5 and paclitaxel 175 mg/M2 every 3 weeks with Neulasta support. First dose on 04/15/2014. Status post 1 cycle  CHEMOTHERAPY INTENT: Control/Palliative  CURRENT # OF CHEMOTHERAPY CYCLES: 2 CURRENT ANTIEMETICS: Zofran, dexamethasone, Compazine  CURRENT SMOKING STATUS: Former smoker, quit 12/05/1997  ORAL CHEMOTHERAPY AND CONSENT: n/a  CURRENT BISPHOSPHONATES USE: None  PAIN MANAGEMENT: no cancer related pain, patient does have RA  NARCOTICS INDUCED CONSTIPATION: none  LIVING WILL AND CODE STATUS: ?   INTERVAL HISTORY: BRAELEE HERRLE 73 y.o. female returns to the clinic today for followup visit. She voices no specific complaints today. She states that she continues to use both Carafate and Protonix as prescribed by her gastroenterologist. She presents today to proceed with cycle #2 of her consolidation chemotherapy with carboplatin and paclitaxel with Neulasta support.  The patient denied having any significant weight loss or night sweats. She has no nausea or vomiting. She has no fever or chills. She denied having any significant chest pain but continues to have shortness breath with exertion with no cough or hemoptysis.  MEDICAL HISTORY: Past Medical History  Diagnosis Date  . Hypertension   . Diabetes mellitus   . Rheumatoid  arthritis   . GERD (gastroesophageal reflux disease)     no meds for  . Hx of radiation therapy 12/16/13-01/30/14    lung 66Gy  . Cancer     LUNG    ALLERGIES:  is allergic to remicade.  MEDICATIONS:  Current Outpatient Prescriptions  Medication Sig Dispense Refill  . losartan-hydrochlorothiazide (HYZAAR) 100-25 MG per tablet Take 1 tablet by mouth daily.      . pantoprazole (PROTONIX) 40 MG tablet Take 1 tablet (40 mg total) by mouth daily.  60 tablet  0  . prochlorperazine (COMPAZINE) 10 MG tablet Take 1 tablet (10 mg total) by mouth every 6 (six) hours as needed for nausea or vomiting.  60 tablet  0  . sucralfate (CARAFATE) 1 GM/10ML suspension Take 10 mL by mouth 4 times daily before meals.  240 mL  0   No current facility-administered medications for this visit.   Facility-Administered Medications Ordered in Other Visits  Medication Dose Route Frequency Provider Last Rate Last Dose  . sodium chloride 0.9 % injection 10 mL  10 mL Intracatheter PRN Curt Bears, MD        SURGICAL HISTORY:  Past Surgical History  Procedure Laterality Date  . Cholecystectomy    . Abdominal hysterectomy    . Dilation and curettage of uterus    . Abdominal surgery      REVIEW OF SYSTEMS:  Constitutional: negative Eyes: negative Ears, nose, mouth, throat, and face: negative Respiratory: negative Cardiovascular: negative Gastrointestinal: positive for dyspepsia Genitourinary:negative Integument/breast: negative Hematologic/lymphatic: negative Musculoskeletal:negative Neurological: negative Behavioral/Psych: negative Endocrine: negative Allergic/Immunologic: negative   PHYSICAL EXAMINATION: General appearance: alert, cooperative and no distress Head: Normocephalic,  without obvious abnormality, atraumatic Neck: no adenopathy, no JVD, supple, symmetrical, trachea midline and thyroid not enlarged, symmetric, no tenderness/mass/nodules Lymph nodes: Cervical, supraclavicular, and axillary  nodes normal. Resp: clear to auscultation bilaterally Back: symmetric, no curvature. ROM normal. No CVA tenderness. Cardio: regular rate and rhythm, S1, S2 normal, no murmur, click, rub or gallop GI: soft, non-tender; bowel sounds normal; no masses,  no organomegaly Extremities: extremities normal, atraumatic, no cyanosis or edema Neurologic: Alert and oriented X 3, normal strength and tone. Normal symmetric reflexes. Normal coordination and gait  ECOG PERFORMANCE STATUS: 1 - Symptomatic but completely ambulatory  Blood pressure 95/60, pulse 96, temperature 97 F (36.1 C), temperature source Oral, resp. rate 18, height 5\' 6"  (1.676 m), weight 155 lb (70.308 kg), SpO2 99.00%.  LABORATORY DATA: Lab Results  Component Value Date   WBC 7.0 05/06/2014   HGB 11.4* 05/06/2014   HCT 33.7* 05/06/2014   MCV 97.7 05/06/2014   PLT 212 05/06/2014      Chemistry      Component Value Date/Time   NA 140 05/06/2014 0913   NA 142 04/22/2014 1503   K 4.0 05/06/2014 0913   K 3.4* 04/22/2014 1503   CL 102 04/22/2014 1503   CO2 23 05/06/2014 0913   CO2 24 04/22/2014 1503   BUN 10.8 05/06/2014 0913   BUN 22 04/22/2014 1503   CREATININE 0.9 05/06/2014 0913   CREATININE 1.35* 04/22/2014 1503      Component Value Date/Time   CALCIUM 9.4 05/06/2014 0913   CALCIUM 9.1 04/22/2014 1503   ALKPHOS 89 05/06/2014 0913   ALKPHOS 108 04/22/2014 1503   AST 17 05/06/2014 0913   AST 22 04/22/2014 1503   ALT 25 05/06/2014 0913   ALT 33 04/22/2014 1503   BILITOT 0.51 05/06/2014 0913   BILITOT 0.3 04/22/2014 1503       RADIOGRAPHIC STUDIES:  ASSESSMENT AND PLAN: This is a very pleasant 73 years old white female with history of stage IIIB non-small cell lung cancer status post a course of concurrent chemoradiation with weekly carboplatin and paclitaxel status post 7 cycles. The patient tolerated her treatment fairly well with no significant adverse effects except for the radiation induced esophagitis.  She was seen by gastroenterology and was  found to have radiation induced esophagitis and was started on treatment with Protonix in addition to Carafate. She status post 1 cycle of consolidation chemotherapy with carboplatin for AUC of 5 and paclitaxel 175 mg/M2 every 3 weeks with Neulasta. Patient was discussed with and also seen by Dr. Julien Nordmann. Her labs repeat. She'll proceed with cycle #2 of her consolidation chemotherapy today as scheduled. She'll continue weekly labs as scheduled and followup in 3 weeks for another symptom management visit prior to cycle #3 of her consolidation chemotherapy.  She was advised to call immediately if she has any concerning symptoms in the interval. The patient voices understanding of current disease status and treatment options and is in agreement with the current care plan.  All questions were answered. The patient knows to call the clinic with any problems, questions or concerns. We can certainly see the patient much sooner if necessary.  Carlton Adam PA-C  ADDENDUM:  Hematology/Oncology Attending:  I had a face to face encounter with the patient today. I recommended her care plan. This is a very pleasant 73 years old white female with stage IIIB non-small cell lung cancer status post induction concurrent chemoradiation with weekly carboplatin and paclitaxel and she was recently started  on consolidation chemotherapy with carboplatin and paclitaxel with Neulasta support every 3 weeks. She status post 1 cycle. She tolerated the first cycle of her treatment fairly well with no significant adverse effects. I recommended for her to proceed with cycle #2 today as scheduled. The patient would come back for followup visit in 3 weeks with the start of cycle #3.  Addendum: The patient completed her dose of paclitaxel today and within 10 minutes of her treatment with carboplatin for the second cycle today,  the patient had significant nausea and vomiting as well as shortness of breath and sore throat.  Carboplatin was discontinued. The patient should have a test dose next cycle before resuming her treatment with carboplatin. She was advised to call immediately if she has any concerning symptoms in the interval.  Disclaimer: This note was dictated with voice recognition software. Similar sounding words can inadvertently be transcribed and may not be corrected upon review. Eilleen Kempf., MD 05/07/2014

## 2014-05-06 NOTE — Telephone Encounter (Signed)
gv and rpinted appts for July,....

## 2014-05-06 NOTE — Progress Notes (Signed)
    1448--pt c/o esophageal irritation, per Dr Vista Mink okay to give an additional 20mg  pepcid IV.  SLJ

## 2014-05-07 ENCOUNTER — Other Ambulatory Visit: Payer: Self-pay | Admitting: Physician Assistant

## 2014-05-07 ENCOUNTER — Ambulatory Visit (HOSPITAL_BASED_OUTPATIENT_CLINIC_OR_DEPARTMENT_OTHER): Payer: Medicare Other

## 2014-05-07 VITALS — BP 130/54 | HR 94 | Temp 98.1°F

## 2014-05-07 DIAGNOSIS — C342 Malignant neoplasm of middle lobe, bronchus or lung: Secondary | ICD-10-CM

## 2014-05-07 DIAGNOSIS — C3491 Malignant neoplasm of unspecified part of right bronchus or lung: Secondary | ICD-10-CM

## 2014-05-07 DIAGNOSIS — Z5189 Encounter for other specified aftercare: Secondary | ICD-10-CM

## 2014-05-07 MED ORDER — PEGFILGRASTIM INJECTION 6 MG/0.6ML
6.0000 mg | Freq: Once | SUBCUTANEOUS | Status: AC
  Administered 2014-05-07: 6 mg via SUBCUTANEOUS
  Filled 2014-05-07: qty 0.6

## 2014-05-07 NOTE — Patient Instructions (Signed)
Continue weekly labs as scheduled Followup in 3 weeks prior to starting next scheduled cycle of chemotherapy

## 2014-05-13 ENCOUNTER — Other Ambulatory Visit (HOSPITAL_BASED_OUTPATIENT_CLINIC_OR_DEPARTMENT_OTHER): Payer: Medicare Other

## 2014-05-13 DIAGNOSIS — C342 Malignant neoplasm of middle lobe, bronchus or lung: Secondary | ICD-10-CM

## 2014-05-13 DIAGNOSIS — C3491 Malignant neoplasm of unspecified part of right bronchus or lung: Secondary | ICD-10-CM

## 2014-05-13 LAB — CBC WITH DIFFERENTIAL/PLATELET
BASO%: 2.9 % — AB (ref 0.0–2.0)
Basophils Absolute: 0.2 10*3/uL — ABNORMAL HIGH (ref 0.0–0.1)
EOS%: 6.7 % (ref 0.0–7.0)
Eosinophils Absolute: 0.6 10*3/uL — ABNORMAL HIGH (ref 0.0–0.5)
HEMATOCRIT: 33.3 % — AB (ref 34.8–46.6)
HEMOGLOBIN: 11.5 g/dL — AB (ref 11.6–15.9)
LYMPH#: 1 10*3/uL (ref 0.9–3.3)
LYMPH%: 11.6 % — ABNORMAL LOW (ref 14.0–49.7)
MCH: 32.6 pg (ref 25.1–34.0)
MCHC: 34.5 g/dL (ref 31.5–36.0)
MCV: 94.3 fL (ref 79.5–101.0)
MONO#: 0.6 10*3/uL (ref 0.1–0.9)
MONO%: 7.6 % (ref 0.0–14.0)
NEUT%: 71.2 % (ref 38.4–76.8)
NEUTROS ABS: 5.9 10*3/uL (ref 1.5–6.5)
Platelets: 161 10*3/uL (ref 145–400)
RBC: 3.53 10*6/uL — ABNORMAL LOW (ref 3.70–5.45)
RDW: 14.1 % (ref 11.2–14.5)
WBC: 8.3 10*3/uL (ref 3.9–10.3)

## 2014-05-13 LAB — COMPREHENSIVE METABOLIC PANEL (CC13)
ALBUMIN: 3.3 g/dL — AB (ref 3.5–5.0)
ALT: 32 U/L (ref 0–55)
AST: 18 U/L (ref 5–34)
Alkaline Phosphatase: 110 U/L (ref 40–150)
Anion Gap: 10 mEq/L (ref 3–11)
BUN: 20.2 mg/dL (ref 7.0–26.0)
CHLORIDE: 104 meq/L (ref 98–109)
CO2: 25 mEq/L (ref 22–29)
Calcium: 9.1 mg/dL (ref 8.4–10.4)
Creatinine: 1 mg/dL (ref 0.6–1.1)
GLUCOSE: 276 mg/dL — AB (ref 70–140)
POTASSIUM: 3.3 meq/L — AB (ref 3.5–5.1)
Sodium: 139 mEq/L (ref 136–145)
Total Bilirubin: 0.32 mg/dL (ref 0.20–1.20)
Total Protein: 6.2 g/dL — ABNORMAL LOW (ref 6.4–8.3)

## 2014-05-15 ENCOUNTER — Telehealth: Payer: Self-pay | Admitting: Internal Medicine

## 2014-05-15 NOTE — Telephone Encounter (Signed)
pt called to r/s lab...done...pt ok and aware of new d.t °

## 2014-05-20 ENCOUNTER — Other Ambulatory Visit: Payer: Medicare Other

## 2014-05-21 ENCOUNTER — Other Ambulatory Visit (HOSPITAL_BASED_OUTPATIENT_CLINIC_OR_DEPARTMENT_OTHER): Payer: Medicare Other

## 2014-05-21 DIAGNOSIS — C3491 Malignant neoplasm of unspecified part of right bronchus or lung: Secondary | ICD-10-CM

## 2014-05-21 DIAGNOSIS — C342 Malignant neoplasm of middle lobe, bronchus or lung: Secondary | ICD-10-CM

## 2014-05-21 LAB — CBC WITH DIFFERENTIAL/PLATELET
BASO%: 0.8 % (ref 0.0–2.0)
Basophils Absolute: 0.1 10*3/uL (ref 0.0–0.1)
EOS ABS: 0.4 10*3/uL (ref 0.0–0.5)
EOS%: 3.5 % (ref 0.0–7.0)
HCT: 34.5 % — ABNORMAL LOW (ref 34.8–46.6)
HGB: 11.5 g/dL — ABNORMAL LOW (ref 11.6–15.9)
LYMPH%: 8.5 % — ABNORMAL LOW (ref 14.0–49.7)
MCH: 32.2 pg (ref 25.1–34.0)
MCHC: 33.4 g/dL (ref 31.5–36.0)
MCV: 96.5 fL (ref 79.5–101.0)
MONO#: 0.9 10*3/uL (ref 0.1–0.9)
MONO%: 7.7 % (ref 0.0–14.0)
NEUT%: 79.5 % — ABNORMAL HIGH (ref 38.4–76.8)
NEUTROS ABS: 8.9 10*3/uL — AB (ref 1.5–6.5)
Platelets: 143 10*3/uL — ABNORMAL LOW (ref 145–400)
RBC: 3.57 10*6/uL — ABNORMAL LOW (ref 3.70–5.45)
RDW: 14.9 % — AB (ref 11.2–14.5)
WBC: 11.2 10*3/uL — AB (ref 3.9–10.3)
lymph#: 1 10*3/uL (ref 0.9–3.3)

## 2014-05-21 LAB — COMPREHENSIVE METABOLIC PANEL (CC13)
ALBUMIN: 3.2 g/dL — AB (ref 3.5–5.0)
ALK PHOS: 109 U/L (ref 40–150)
ALT: 27 U/L (ref 0–55)
AST: 18 U/L (ref 5–34)
Anion Gap: 12 mEq/L — ABNORMAL HIGH (ref 3–11)
BUN: 13.5 mg/dL (ref 7.0–26.0)
CO2: 23 mEq/L (ref 22–29)
Calcium: 9.3 mg/dL (ref 8.4–10.4)
Chloride: 105 mEq/L (ref 98–109)
Creatinine: 0.9 mg/dL (ref 0.6–1.1)
GLUCOSE: 222 mg/dL — AB (ref 70–140)
POTASSIUM: 3.4 meq/L — AB (ref 3.5–5.1)
Sodium: 140 mEq/L (ref 136–145)
TOTAL PROTEIN: 6.3 g/dL — AB (ref 6.4–8.3)
Total Bilirubin: 0.43 mg/dL (ref 0.20–1.20)

## 2014-05-22 ENCOUNTER — Telehealth: Payer: Self-pay | Admitting: Internal Medicine

## 2014-05-22 NOTE — Telephone Encounter (Signed)
returned pt call and confirmed appt....ok and aware

## 2014-05-23 ENCOUNTER — Telehealth (HOSPITAL_COMMUNITY): Payer: Self-pay

## 2014-05-23 NOTE — Telephone Encounter (Signed)
I have called and left a message with Kellye to inquire about participation in Pulmonary Rehab. Will send letter in mail and follow up.

## 2014-05-26 ENCOUNTER — Ambulatory Visit (HOSPITAL_BASED_OUTPATIENT_CLINIC_OR_DEPARTMENT_OTHER): Payer: Medicare Other | Admitting: Physician Assistant

## 2014-05-26 ENCOUNTER — Telehealth (HOSPITAL_COMMUNITY): Payer: Self-pay

## 2014-05-26 ENCOUNTER — Encounter: Payer: Self-pay | Admitting: Physician Assistant

## 2014-05-26 ENCOUNTER — Telehealth: Payer: Self-pay | Admitting: Internal Medicine

## 2014-05-26 ENCOUNTER — Other Ambulatory Visit (HOSPITAL_BASED_OUTPATIENT_CLINIC_OR_DEPARTMENT_OTHER): Payer: Medicare Other

## 2014-05-26 ENCOUNTER — Encounter: Payer: Self-pay | Admitting: Adult Health

## 2014-05-26 ENCOUNTER — Ambulatory Visit (INDEPENDENT_AMBULATORY_CARE_PROVIDER_SITE_OTHER): Payer: Medicare Other | Admitting: Adult Health

## 2014-05-26 VITALS — BP 118/83 | HR 89 | Temp 97.6°F | Resp 19 | Ht 66.0 in | Wt 153.0 lb

## 2014-05-26 VITALS — BP 128/88 | HR 106 | Ht 66.0 in | Wt 153.2 lb

## 2014-05-26 DIAGNOSIS — R1310 Dysphagia, unspecified: Secondary | ICD-10-CM

## 2014-05-26 DIAGNOSIS — C3410 Malignant neoplasm of upper lobe, unspecified bronchus or lung: Secondary | ICD-10-CM

## 2014-05-26 DIAGNOSIS — R0600 Dyspnea, unspecified: Secondary | ICD-10-CM

## 2014-05-26 DIAGNOSIS — C3491 Malignant neoplasm of unspecified part of right bronchus or lung: Secondary | ICD-10-CM

## 2014-05-26 DIAGNOSIS — R0609 Other forms of dyspnea: Secondary | ICD-10-CM

## 2014-05-26 DIAGNOSIS — C342 Malignant neoplasm of middle lobe, bronchus or lung: Secondary | ICD-10-CM

## 2014-05-26 DIAGNOSIS — R0989 Other specified symptoms and signs involving the circulatory and respiratory systems: Secondary | ICD-10-CM

## 2014-05-26 DIAGNOSIS — C3490 Malignant neoplasm of unspecified part of unspecified bronchus or lung: Secondary | ICD-10-CM

## 2014-05-26 LAB — CBC WITH DIFFERENTIAL/PLATELET
BASO%: 1.1 % (ref 0.0–2.0)
Basophils Absolute: 0.1 10*3/uL (ref 0.0–0.1)
EOS%: 3.4 % (ref 0.0–7.0)
Eosinophils Absolute: 0.3 10*3/uL (ref 0.0–0.5)
HEMATOCRIT: 35.4 % (ref 34.8–46.6)
HGB: 11.9 g/dL (ref 11.6–15.9)
LYMPH%: 9.5 % — AB (ref 14.0–49.7)
MCH: 32.3 pg (ref 25.1–34.0)
MCHC: 33.6 g/dL (ref 31.5–36.0)
MCV: 96.2 fL (ref 79.5–101.0)
MONO#: 0.7 10*3/uL (ref 0.1–0.9)
MONO%: 9.7 % (ref 0.0–14.0)
NEUT#: 5.9 10*3/uL (ref 1.5–6.5)
NEUT%: 76.3 % (ref 38.4–76.8)
PLATELETS: 228 10*3/uL (ref 145–400)
RBC: 3.68 10*6/uL — ABNORMAL LOW (ref 3.70–5.45)
RDW: 15.7 % — ABNORMAL HIGH (ref 11.2–14.5)
WBC: 7.7 10*3/uL (ref 3.9–10.3)
lymph#: 0.7 10*3/uL — ABNORMAL LOW (ref 0.9–3.3)

## 2014-05-26 LAB — COMPREHENSIVE METABOLIC PANEL (CC13)
ALK PHOS: 104 U/L (ref 40–150)
ALT: 29 U/L (ref 0–55)
ANION GAP: 8 meq/L (ref 3–11)
AST: 20 U/L (ref 5–34)
Albumin: 3.3 g/dL — ABNORMAL LOW (ref 3.5–5.0)
BILIRUBIN TOTAL: 0.62 mg/dL (ref 0.20–1.20)
BUN: 9 mg/dL (ref 7.0–26.0)
CO2: 26 mEq/L (ref 22–29)
CREATININE: 0.8 mg/dL (ref 0.6–1.1)
Calcium: 9.4 mg/dL (ref 8.4–10.4)
Chloride: 106 mEq/L (ref 98–109)
Glucose: 162 mg/dl — ABNORMAL HIGH (ref 70–140)
Potassium: 3.8 mEq/L (ref 3.5–5.1)
Sodium: 141 mEq/L (ref 136–145)
TOTAL PROTEIN: 6.5 g/dL (ref 6.4–8.3)

## 2014-05-26 MED ORDER — PANTOPRAZOLE SODIUM 40 MG PO TBEC: 40.0000 mg | DELAYED_RELEASE_TABLET | Freq: Every day | ORAL | Status: DC

## 2014-05-26 NOTE — Progress Notes (Signed)
Subjective:    Patient ID: Megan [name], female    DOB: [birth year], 73 y.o.   MRN: [id number] PCP [name], MD  HPI IOV 11/07/2013  73 year old female. Accompanied by friend Mr Monica Becton who it is ok to share all health info. She says mid October when in [place] she developed acute onset of dry cough. Vividly remebers exact moment when she had cough. Is mild, persistent, dry in quality and not associate with sinus drainage, wheeze, dyspnea, hemoptysis or weight loss. Followed with PMD and then resulted in CDXR and then CT showes large RML mass that is necrotic with subcarinal node. THerefore referred here.   Lung mass relevant hx   - rheumatoid arthtiris +. On humira x 5 years   -  reports that she quit smoking about 15 years ago. Her smoking use included Cigarettes. She has a 135 pack-year smoking history. She does not have any smokeless tobacco history on file.     CLINICAL DATA: Nonproductive cough, remote history of smoking,  abnormal pulmonary nodule reported on previous chest x-ray  EXAM:  CT CHEST WITH CONTRAST  TECHNIQUE:  Multidetector CT imaging of the chest was performed during  intravenous contrast administration.  CONTRAST: 18mL OMNIPAQUE IOHEXOL 300 MG/ML SOLN  COMPARISON: PA and lateral chest x-ray of October 22, 2013.  FINDINGS:  There is dense consolidation of the anterior inferior aspect of the  right upper lobe corresponding to the abnormal density on the chest  x-ray. No air bronchograms are demonstrated within it. There is  patchy increased density along its periphery. The consolidated lung  abuts the mediastinum and the anterior pleural surface and extends  posteromedially to the right hilum. No calcifications are  demonstrated within this consolidated lung. The dimensions are 6.5  cm transversely x 5.5 cm AP x 5 cm in superior inferior dimension.  Elsewhere within the lungs no abnormal masses or parenchymal  consolidation are  demonstrated. There is a calcified 3 mm diameter  subpleural nodule in the left lower lobe on image 39 of series 2.  At mediastinal window settings the cardiac chambers are normal in  size. There is subcarinal lymphadenopathy measuring 2.1 x 2.5 cm.  There is a lymph node measuring approximately 1.3 cm in diameter in  the anterior right hilum seen on image 25 of series 2. There are  calcified normal-sized left infrahilar lymph nodes. The thoracic  esophagus does not appear abnormally distended. There is no pleural  nor pericardial effusion. The caliber of the thoracic aorta is  normal. There are no bulky axillary lymph nodes.  Within the upper abdomen the observed portions of the liver, spleen,  and kidneys are normal in appearance. The thoracic vertebral bodies  are preserved in height. There is degenerative disc change at  multiple levels with calcification of the anterior longitudinal  ligament. The sternum and retrosternal soft tissues appear normal.  IMPRESSION:  1. There is a irregularly marginated parenchymal consolidation or  soft tissue density mass in the anterior inferior aspect of the  right upper lobe. A borderline enlarged right hilar lymph node as  well as an enlarged subcarinal lymph node are demonstrated. The  findings are worrisome for malignancy although certainly atypical  infection could be present.  2. There is no pleural or pericardial effusion. There is no evidence  of CHF.  3. There is evidence of previous granulomatous infection on the  left.  Electronically Signed  By: [name] Martinique  On: 10/25/2013 17:25  04/01/2014 Acute OV (Stage III B Non small cell lung cancer )  Complains of increased/consistent R lung pain, increased SOB, dry cough, some chest congestion, slight tightness, improving wheezing x4-6 weeks.   Seems to all stem around swallowing . When she tries to eat or swallow she feels discomfort along along the mid chest and right lung area. Has trouble  swallowing at times. Eating softer diet but feels food gets stuck . No exertional chest pain, orthopnea, hemoptysis, calf pain, or discolored mucus or fever.  Seen by Oncology last week and referred to GI. OV pending.  Her chemo is on hold for now .  She has completed XRT .  She had a CT chest done on 5/1 that showed decrease in size of RUL mass.  Pt seems very confused today with "whether I have cancer or not " . We discussed her Dx and ongoing treatment with Oncology . Support provided. Denies confusion . Previous MRI brain neg for mets.  Denies any f/c/s, hemoptysis, nausea, vomiting. Does have heartburn  No otc meds used for tx.  Eating seems to be associated with pain.  >rx Advair   05/26/2014 Follow up  Returns for 1 month follow up .  Says that her breathing/shortness of breath is slightly better.  However has sore throat.  Was seen by GI found to have severe radiation esophagitis, Was cont on Protonix . Feels much better. Still having swallow issues-some better. On/off.  Undergoing Chemo, very weak with low energy.  No hemoptysis, chest pain, increased edema or n/v/d.        Review of Systems  Constitutional: Negative for fever and unexpected weight change.  HENT: Negative for congestion, dental problem, ear pain, nosebleeds, postnasal drip, rhinorrhea, sinus pressure, sneezing, sore throat and trouble swallowing.   Eyes: Negative for redness and itching.  Respiratory: Positive for cough.  Cardiovascular: Negative for palpitations and leg swelling.  Gastrointestinal: Negative for nausea and vomiting.  Genitourinary: Negative for dysuria.  Musculoskeletal: Negative for joint swelling.  Skin: Negative for rash.  Neurological: Negative for headaches.  Hematological: Does not bruise/bleed easily.  Psychiatric/Behavioral: Negative for dysphoric mood. The patient is not nervous/anxious.        Objective:   Physical Exam  Vitals reviewed. Constitutional: She is oriented  to person, place, and time. She appears well-developed and well-nourished. No distress.  HENT:  Head: Normocephalic and atraumatic.  Right Ear: External ear normal.  Left Ear: External ear normal.  Mouth/Throat: Oropharynx is clear and moist. No oropharyngeal exudate.  Eyes: Conjunctivae and EOM are normal. Pupils are equal, round, and reactive to light. Right eye exhibits no discharge. Left eye exhibits no discharge. No scleral icterus.  Neck: Normal range of motion. Neck supple. No JVD present. No tracheal deviation present. No thyromegaly present.  Cardiovascular: Normal rate, regular rhythm, normal heart sounds and intact distal pulses.  Exam reveals no gallop and no friction rub.   No murmur heard. Pulmonary/Chest: Effort normal and breath sounds normal. No respiratory distress. She has no wheezes. She has no rales. Minimal right chest wall tenderness, no deformity noted, no rash.  Abdominal: Soft. Bowel sounds are normal. She exhibits no distension and no mass. There is no tenderness. There is no rebound and no guarding.  Musculoskeletal: Normal range of motion. She exhibits no edema and no tenderness.  Lymphadenopathy:    She has no cervical adenopathy.  Neurological: She is alert and oriented to person, place, and time. She has normal reflexes. No cranial  nerve deficit. She exhibits normal muscle tone. Coordination normal.  Skin: Skin is warm and dry. No rash noted. She is not diaphoretic. No erythema. No pallor.  Psychiatric: She has a normal mood and affect. Her behavior is normal. Judgment and thought content normal.          Assessment & Plan:

## 2014-05-26 NOTE — Patient Instructions (Signed)
Continue on Advair 1 puff Twice daily  -rinse well after use.  Continue on Protoinix 40mg  daily before meal  Follow up Dr. Chase Caller in 2 -3 months and As needed   Continue follow up with Oncology as planned

## 2014-05-26 NOTE — Telephone Encounter (Signed)
I have called and left a message with [facility] to inquire about participation in Pulmonary Rehab. Will send letter in mail and follow up.

## 2014-05-26 NOTE — Assessment & Plan Note (Addendum)
?  RAD complicated by radiation esophagitis  Improving on Advair and PPI /GERD tx   Plan  Continue on Advair 1 puff Twice daily  -rinse well after use.  Continue on Protoinix 40mg  daily before meal  Follow up Dr. Chase Caller in 2 -3 months and As needed   Continue follow up with Oncology as planned

## 2014-05-26 NOTE — Telephone Encounter (Signed)
gv and printed appt scehd and avs for pt for July thru Aug..[name]

## 2014-05-26 NOTE — Progress Notes (Addendum)
[facility] [phone]   [phone]  SHARED VISIT PROGRESS NOTE  Megan Bolt, MD [address] [place] [address]  DIAGNOSIS: Squamous cell lung cancer  Primary site: Lung (Right)  Staging method: AJCC 7th Edition  Clinical: Stage IIIB (T3, N3, M0)  Summary: Stage IIIB (T3, N3, M0)   PRIOR THERAPY: Concurrent chemoradiation with weekly chemotherapy in the form of carboplatin for AUC of 2 and paclitaxel 45 mg/m2. Status post 7 week of treatment.  Last dose was given 01/27/2014 no significant response in her disease.  CURRENT THERAPY: Consolidation chemotherapy with carboplatin for AUC of 5 and paclitaxel 175 mg/M2 every 3 weeks with Neulasta support. First dose on 04/15/2014. Status post 2 cycles. Carboplatin was discontinued starting cycle #2 secondary to hypersensitivity reaction.  CHEMOTHERAPY INTENT: Control/Palliative  CURRENT # OF CHEMOTHERAPY CYCLES: 3 CURRENT ANTIEMETICS: Zofran, dexamethasone, Compazine  CURRENT SMOKING STATUS: Former smoker, quit 12/05/1997  ORAL CHEMOTHERAPY AND CONSENT: n/a  CURRENT BISPHOSPHONATES USE: None  PAIN MANAGEMENT: no cancer related pain, patient does have RA  NARCOTICS INDUCED CONSTIPATION: none  LIVING WILL AND CODE STATUS: ?   INTERVAL HISTORY: Megan [name] 73 y.o. female returns to the clinic today for followup visit. She voices no specific complaints today. She states that she continues to use both Carafate and Protonix as prescribed by her gastroenterologist. She complains of continued esophageal irritation which she believes stem from her endoscopy performed approximately 2 months ago. She plans to followup with gastroenterology about these complaints. She had some nausea and vomiting during cycle #2. She presents today to proceed with cycle #3 of her consolidation chemotherapy with carboplatin and paclitaxel with Neulasta support.  The patient denied having any  significant weight loss or night sweats. She has no current nausea or vomiting. She has no fever or chills. She denied having any significant chest pain but continues to have shortness breath with exertion with no cough or hemoptysis.  MEDICAL HISTORY: Past Medical History  Diagnosis Date  . Hypertension   . Diabetes mellitus   . Rheumatoid arthritis   . GERD (gastroesophageal reflux disease)     no meds for  . Hx of radiation therapy 12/16/13-01/30/14    lung 66Gy  . Cancer     LUNG    ALLERGIES:  is allergic to remicade.  MEDICATIONS:  Current Outpatient Prescriptions  Medication Sig Dispense Refill  . losartan-hydrochlorothiazide (HYZAAR) 100-25 MG per tablet Take 1 tablet by mouth daily.      . sucralfate (CARAFATE) 1 GM/10ML suspension Take 10 mL by mouth 4 times daily before meals.  240 mL  0  . pantoprazole (PROTONIX) 40 MG tablet Take 1 tablet (40 mg total) by mouth daily.  90 tablet  1  . POTASSIUM CHLORIDE PO Take by mouth.      . prochlorperazine (COMPAZINE) 10 MG tablet TAKE 1 TABLET BY MOUTH EVERY 6 HOURS AS NEEDED FOR NAUSEA OR VOMITING  60 tablet  0   No current facility-administered medications for this visit.   Facility-Administered Medications Ordered in Other Visits  Medication Dose Route Frequency Provider Last Rate Last Dose  . sodium chloride 0.9 % injection 10 mL  10 mL Intracatheter PRN Curt Bears, MD        SURGICAL HISTORY:  Past Surgical History  Procedure Laterality Date  . Cholecystectomy    . Abdominal hysterectomy    . Dilation and curettage of uterus    . Abdominal surgery  REVIEW OF SYSTEMS:  Constitutional: negative Eyes: negative Ears, nose, mouth, throat, and face: negative Respiratory: negative Cardiovascular: negative Gastrointestinal: positive for dyspepsia Genitourinary:negative Integument/breast: negative Hematologic/lymphatic: negative Musculoskeletal:negative Neurological: negative Behavioral/Psych:  negative Endocrine: negative Allergic/Immunologic: negative   PHYSICAL EXAMINATION: General appearance: alert, cooperative and no distress Head: Normocephalic, without obvious abnormality, atraumatic Neck: no adenopathy, no JVD, supple, symmetrical, trachea midline and thyroid not enlarged, symmetric, no tenderness/mass/nodules Lymph nodes: Cervical, supraclavicular, and axillary nodes normal. Resp: clear to auscultation bilaterally Back: symmetric, no curvature. ROM normal. No CVA tenderness. Cardio: regular rate and rhythm, S1, S2 normal, no murmur, click, rub or gallop GI: soft, non-tender; bowel sounds normal; no masses,  no organomegaly Extremities: extremities normal, atraumatic, no cyanosis or edema Neurologic: Alert and oriented X 3, normal strength and tone. Normal symmetric reflexes. Normal coordination and gait  ECOG PERFORMANCE STATUS: 1 - Symptomatic but completely ambulatory  Blood pressure 118/83, pulse 89, temperature 97.6 F (36.4 C), temperature source Oral, resp. rate 19, height 5\' 6"  (1.676 m), weight 153 lb (69.4 kg).  LABORATORY DATA: Lab Results  Component Value Date   WBC 7.7 05/26/2014   HGB 11.9 05/26/2014   HCT 35.4 05/26/2014   MCV 96.2 05/26/2014   PLT 228 05/26/2014      Chemistry      Component Value Date/Time   NA 141 05/26/2014 0930   NA 142 04/22/2014 1503   K 3.8 05/26/2014 0930   K 3.4* 04/22/2014 1503   CL 102 04/22/2014 1503   CO2 26 05/26/2014 0930   CO2 24 04/22/2014 1503   BUN 9.0 05/26/2014 0930   BUN 22 04/22/2014 1503   CREATININE 0.8 05/26/2014 0930   CREATININE 1.35* 04/22/2014 1503      Component Value Date/Time   CALCIUM 9.4 05/26/2014 0930   CALCIUM 9.1 04/22/2014 1503   ALKPHOS 104 05/26/2014 0930   ALKPHOS 108 04/22/2014 1503   AST 20 05/26/2014 0930   AST 22 04/22/2014 1503   ALT 29 05/26/2014 0930   ALT 33 04/22/2014 1503   BILITOT 0.62 05/26/2014 0930   BILITOT 0.3 04/22/2014 1503       RADIOGRAPHIC STUDIES:  ASSESSMENT AND  PLAN: This is a very pleasant 73 years old white female with history of stage IIIB non-small cell lung cancer status post a course of concurrent chemoradiation with weekly carboplatin and paclitaxel status post 7 cycles. The patient tolerated her treatment fairly well with no significant adverse effects except for the radiation induced esophagitis.  She was seen by gastroenterology and was found to have radiation induced esophagitis and was started on treatment with Protonix in addition to Carafate. She complains of continued his esophageal irritation. She will followup with gastroenterology regarding this complaint. Patient was discussed with an also seen by Dr. Julien Nordmann. She'll be given carboplatin test dose prior to the start of cycle #3 of her consolidation chemotherapy with carboplatin and paclitaxel with Neulasta support. She will continue weekly labs as scheduled. She'll followup in 3 weeks with restaging CT scan of her chest with contrast to reevaluate her disease.  She was advised to call immediately if she has any concerning symptoms in the interval. The patient voices understanding of current disease status and treatment options and is in agreement with the current care plan.  All questions were answered. The patient knows to call the clinic with any problems, questions or concerns. We can certainly see the patient much sooner if necessary.  Carlton Adam, PA-C  Disclaimer: This note  was dictated with voice recognition software. Similar sounding words can inadvertently be transcribed and may not be corrected upon review. Carlton Adam, PA-C 05/26/2014  ADDENDUM: Hematology/Oncology Attending: I had a face to face encounter with the patient. I recommended her care plan. This is a very pleasant 73 years old white female with history of stage IIIB non-small cell lung cancer status post concurrent chemoradiation and she is currently undergoing consolidation chemotherapy with carboplatin  and paclitaxel. The patient developed hypersensitivity reaction to the last dose of carboplatin which was discontinued. She would have a test dose today before proceeding with the last dose of her treatment.  Her test dose was dosed in today and we will discontinue treatment was carboplatin and the patient will proceed with only paclitaxel for the last cycle of her consolidation treatment. She would come back for followup visit in 3 weeks after repeating CT scan of the chest for reevaluation of her disease. She was advised to call immediately if she has any concerning symptoms and interval.  Disclaimer: This note was dictated with voice recognition software. Similar sounding words can inadvertently be transcribed and may not be corrected upon review.

## 2014-05-26 NOTE — Telephone Encounter (Signed)
Dr. Hilarie Fredrickson do you approve of change to [place]?

## 2014-05-27 ENCOUNTER — Telehealth (HOSPITAL_COMMUNITY): Payer: Self-pay

## 2014-05-27 ENCOUNTER — Other Ambulatory Visit: Payer: Self-pay | Admitting: Internal Medicine

## 2014-05-27 ENCOUNTER — Ambulatory Visit: Payer: Medicare Other | Admitting: Nutrition

## 2014-05-27 ENCOUNTER — Ambulatory Visit (HOSPITAL_BASED_OUTPATIENT_CLINIC_OR_DEPARTMENT_OTHER): Payer: Medicare Other

## 2014-05-27 ENCOUNTER — Other Ambulatory Visit: Payer: Medicare Other

## 2014-05-27 VITALS — BP 121/70 | HR 87 | Temp 98.2°F | Resp 17

## 2014-05-27 DIAGNOSIS — Z5111 Encounter for antineoplastic chemotherapy: Secondary | ICD-10-CM

## 2014-05-27 DIAGNOSIS — C342 Malignant neoplasm of middle lobe, bronchus or lung: Secondary | ICD-10-CM

## 2014-05-27 DIAGNOSIS — C3491 Malignant neoplasm of unspecified part of right bronchus or lung: Secondary | ICD-10-CM

## 2014-05-27 MED ORDER — ONDANSETRON 16 MG/50ML IVPB (CHCC)
INTRAVENOUS | Status: AC
  Filled 2014-05-27: qty 16

## 2014-05-27 MED ORDER — DIPHENHYDRAMINE HCL 50 MG/ML IJ SOLN
INTRAMUSCULAR | Status: AC
  Filled 2014-05-27: qty 1

## 2014-05-27 MED ORDER — SODIUM CHLORIDE 0.9 % IV SOLN: 411.0000 mg | Freq: Once | INTRAVENOUS | Status: DC

## 2014-05-27 MED ORDER — FAMOTIDINE IN NACL 20-0.9 MG/50ML-% IV SOLN
INTRAVENOUS | Status: AC
  Filled 2014-05-27: qty 50

## 2014-05-27 MED ORDER — DEXAMETHASONE SODIUM PHOSPHATE 20 MG/5ML IJ SOLN
20.0000 mg | Freq: Once | INTRAMUSCULAR | Status: AC
  Administered 2014-05-27: 20 mg via INTRAVENOUS

## 2014-05-27 MED ORDER — SODIUM CHLORIDE 0.9 % IV SOLN
Freq: Once | INTRAVENOUS | Status: AC
  Administered 2014-05-27: 11:00:00 via INTRAVENOUS

## 2014-05-27 MED ORDER — FAMOTIDINE IN NACL 20-0.9 MG/50ML-% IV SOLN
20.0000 mg | Freq: Once | INTRAVENOUS | Status: AC
  Administered 2014-05-27: 20 mg via INTRAVENOUS

## 2014-05-27 MED ORDER — DEXAMETHASONE SODIUM PHOSPHATE 20 MG/5ML IJ SOLN
INTRAMUSCULAR | Status: AC
  Filled 2014-05-27: qty 5

## 2014-05-27 MED ORDER — DIPHENHYDRAMINE HCL 50 MG/ML IJ SOLN
50.0000 mg | Freq: Once | INTRAMUSCULAR | Status: AC
  Administered 2014-05-27: 50 mg via INTRAVENOUS

## 2014-05-27 MED ORDER — PACLITAXEL CHEMO INJECTION 300 MG/50ML
175.0000 mg/m2 | Freq: Once | INTRAVENOUS | Status: AC
  Administered 2014-05-27: 318 mg via INTRAVENOUS
  Filled 2014-05-27: qty 53

## 2014-05-27 MED ORDER — ONDANSETRON 16 MG/50ML IVPB (CHCC)
16.0000 mg | Freq: Once | INTRAVENOUS | Status: AC
  Administered 2014-05-27: 16 mg via INTRAVENOUS

## 2014-05-27 MED ORDER — CARBOPLATIN CHEMO INTRADERMAL TEST DOSE 100MCG/0.02ML
100.0000 ug | Freq: Once | INTRADERMAL | Status: AC
  Administered 2014-05-27: 100 ug via INTRADERMAL
  Filled 2014-05-27: qty 0.01

## 2014-05-27 NOTE — Telephone Encounter (Signed)
Ok with me 

## 2014-05-27 NOTE — Telephone Encounter (Signed)
Called patient regarding entrance to Pulmonary Rehab.  Patient states that they are interested in attending the program.  Megan [name] is going to verify insurance coverage and follow up.

## 2014-05-27 NOTE — Telephone Encounter (Signed)
I will accept patient but he must understand that he has a higher risk for an esophageal tear with any procedure since he has a radiation- induced stricture.

## 2014-05-27 NOTE — Telephone Encounter (Signed)
Dr. Hilarie Fredrickson do you approve of change?

## 2014-05-27 NOTE — Progress Notes (Signed)
Patient feels well with good appetite.  Weight is stable and documented as 153 pounds July 27.  Patient continues to have difficulty swallowing, but is going to see a GI physician.  Patient has no nutrition concerns.  Nutrition diagnosis: Unintended weight loss resolved.  Patient to continue strategies for adequate oral intake including oral nutrition supplements as needed to promote weight maintenance.  Patient agrees to contact me if she develops any nutrition concerns.  She has my contact information.   **Disclaimer: This note was dictated with voice recognition software. Similar sounding words can inadvertently be transcribed and this note may contain transcription errors which may not have been corrected upon publication of note.**

## 2014-05-28 ENCOUNTER — Telehealth: Payer: Self-pay | Admitting: Internal Medicine

## 2014-05-28 ENCOUNTER — Ambulatory Visit (HOSPITAL_BASED_OUTPATIENT_CLINIC_OR_DEPARTMENT_OTHER): Payer: Medicare Other

## 2014-05-28 VITALS — BP 116/58 | HR 93 | Temp 97.9°F

## 2014-05-28 DIAGNOSIS — C3491 Malignant neoplasm of unspecified part of right bronchus or lung: Secondary | ICD-10-CM

## 2014-05-28 DIAGNOSIS — C342 Malignant neoplasm of middle lobe, bronchus or lung: Secondary | ICD-10-CM

## 2014-05-28 DIAGNOSIS — Z5189 Encounter for other specified aftercare: Secondary | ICD-10-CM

## 2014-05-28 MED ORDER — PEGFILGRASTIM INJECTION 6 MG/0.6ML
6.0000 mg | Freq: Once | SUBCUTANEOUS | Status: AC
  Administered 2014-05-28: 6 mg via SUBCUTANEOUS
  Filled 2014-05-28: qty 0.6

## 2014-05-28 MED ORDER — FLUTICASONE-SALMETEROL 100-50 MCG/DOSE IN AEPB: 1.0000 | INHALATION_SPRAY | Freq: Two times a day (BID) | RESPIRATORY_TRACT | Status: DC

## 2014-05-28 NOTE — Telephone Encounter (Signed)
Pt informed Dr. Kelby Fam note and does understand the risk.  Appt scheduled

## 2014-05-28 NOTE — Telephone Encounter (Signed)
RX for advair has been called in. Nothing further needed

## 2014-05-28 NOTE — Telephone Encounter (Signed)
Lm for pt to call

## 2014-05-28 NOTE — Patient Instructions (Signed)
Continue weekly labs as scheduled Followup in 3 weeks a restaging CT scan of your chest to reevaluate your disease

## 2014-06-02 ENCOUNTER — Other Ambulatory Visit (HOSPITAL_BASED_OUTPATIENT_CLINIC_OR_DEPARTMENT_OTHER): Payer: Medicare Other

## 2014-06-02 DIAGNOSIS — C3490 Malignant neoplasm of unspecified part of unspecified bronchus or lung: Secondary | ICD-10-CM

## 2014-06-02 DIAGNOSIS — C342 Malignant neoplasm of middle lobe, bronchus or lung: Secondary | ICD-10-CM

## 2014-06-02 LAB — CBC WITH DIFFERENTIAL/PLATELET
BASO%: 1.4 % (ref 0.0–2.0)
BASOS ABS: 0.1 10*3/uL (ref 0.0–0.1)
EOS ABS: 0.6 10*3/uL — AB (ref 0.0–0.5)
EOS%: 10.1 % — ABNORMAL HIGH (ref 0.0–7.0)
HCT: 35.2 % (ref 34.8–46.6)
HGB: 11.9 g/dL (ref 11.6–15.9)
LYMPH%: 14.4 % (ref 14.0–49.7)
MCH: 32.3 pg (ref 25.1–34.0)
MCHC: 33.8 g/dL (ref 31.5–36.0)
MCV: 95.4 fL (ref 79.5–101.0)
MONO#: 0.4 10*3/uL (ref 0.1–0.9)
MONO%: 6.2 % (ref 0.0–14.0)
NEUT%: 67.9 % (ref 38.4–76.8)
NEUTROS ABS: 4.1 10*3/uL (ref 1.5–6.5)
Platelets: 195 10*3/uL (ref 145–400)
RBC: 3.69 10*6/uL — ABNORMAL LOW (ref 3.70–5.45)
RDW: 15.5 % — AB (ref 11.2–14.5)
WBC: 6 10*3/uL (ref 3.9–10.3)
lymph#: 0.9 10*3/uL (ref 0.9–3.3)

## 2014-06-02 LAB — COMPREHENSIVE METABOLIC PANEL (CC13)
ALBUMIN: 3.4 g/dL — AB (ref 3.5–5.0)
ALT: 19 U/L (ref 0–55)
ANION GAP: 10 meq/L (ref 3–11)
AST: 13 U/L (ref 5–34)
Alkaline Phosphatase: 118 U/L (ref 40–150)
BUN: 15 mg/dL (ref 7.0–26.0)
CALCIUM: 9.7 mg/dL (ref 8.4–10.4)
CHLORIDE: 105 meq/L (ref 98–109)
CO2: 23 meq/L (ref 22–29)
Creatinine: 0.8 mg/dL (ref 0.6–1.1)
Glucose: 201 mg/dl — ABNORMAL HIGH (ref 70–140)
POTASSIUM: 3.7 meq/L (ref 3.5–5.1)
Sodium: 138 mEq/L (ref 136–145)
Total Bilirubin: 0.64 mg/dL (ref 0.20–1.20)
Total Protein: 6.5 g/dL (ref 6.4–8.3)

## 2014-06-09 ENCOUNTER — Other Ambulatory Visit (HOSPITAL_BASED_OUTPATIENT_CLINIC_OR_DEPARTMENT_OTHER): Payer: Medicare Other

## 2014-06-09 DIAGNOSIS — C3490 Malignant neoplasm of unspecified part of unspecified bronchus or lung: Secondary | ICD-10-CM

## 2014-06-09 DIAGNOSIS — C342 Malignant neoplasm of middle lobe, bronchus or lung: Secondary | ICD-10-CM

## 2014-06-09 LAB — COMPREHENSIVE METABOLIC PANEL
ALT: 21 U/L (ref 0–35)
AST: 16 U/L (ref 0–37)
Albumin: 3.8 g/dL (ref 3.5–5.2)
Alkaline Phosphatase: 106 U/L (ref 39–117)
BUN: 13 mg/dL (ref 6–23)
CALCIUM: 8.9 mg/dL (ref 8.4–10.5)
CHLORIDE: 101 meq/L (ref 96–112)
CO2: 22 meq/L (ref 19–32)
CREATININE: 0.87 mg/dL (ref 0.50–1.10)
Glucose, Bld: 260 mg/dL — ABNORMAL HIGH (ref 70–99)
Potassium: 3.3 mEq/L — ABNORMAL LOW (ref 3.5–5.3)
Sodium: 136 mEq/L (ref 135–145)
Total Bilirubin: 0.5 mg/dL (ref 0.2–1.2)
Total Protein: 6.1 g/dL (ref 6.0–8.3)

## 2014-06-09 LAB — CBC WITH DIFFERENTIAL/PLATELET
BASO%: 1.5 % (ref 0.0–2.0)
Basophils Absolute: 0.2 10*3/uL — ABNORMAL HIGH (ref 0.0–0.1)
EOS ABS: 0.5 10*3/uL (ref 0.0–0.5)
EOS%: 4.5 % (ref 0.0–7.0)
HCT: 34.3 % — ABNORMAL LOW (ref 34.8–46.6)
HGB: 11.5 g/dL — ABNORMAL LOW (ref 11.6–15.9)
LYMPH#: 1 10*3/uL (ref 0.9–3.3)
LYMPH%: 9.1 % — ABNORMAL LOW (ref 14.0–49.7)
MCH: 32.5 pg (ref 25.1–34.0)
MCHC: 33.5 g/dL (ref 31.5–36.0)
MCV: 96.9 fL (ref 79.5–101.0)
MONO#: 0.7 10*3/uL (ref 0.1–0.9)
MONO%: 5.9 % (ref 0.0–14.0)
NEUT%: 79 % — ABNORMAL HIGH (ref 38.4–76.8)
NEUTROS ABS: 9 10*3/uL — AB (ref 1.5–6.5)
Platelets: 137 10*3/uL — ABNORMAL LOW (ref 145–400)
RBC: 3.54 10*6/uL — AB (ref 3.70–5.45)
RDW: 16.3 % — AB (ref 11.2–14.5)
WBC: 11.5 10*3/uL — AB (ref 3.9–10.3)

## 2014-06-13 ENCOUNTER — Ambulatory Visit (HOSPITAL_COMMUNITY): Payer: Medicare Other

## 2014-06-13 ENCOUNTER — Ambulatory Visit (HOSPITAL_COMMUNITY)
Admission: RE | Admit: 2014-06-13 | Discharge: 2014-06-13 | Disposition: A | Discharge status: Home / Self Care | Payer: Medicare Other | Source: Ambulatory Visit | Attending: Physician Assistant | Admitting: Physician Assistant

## 2014-06-13 ENCOUNTER — Encounter (HOSPITAL_COMMUNITY): Payer: Self-pay

## 2014-06-13 DIAGNOSIS — C3490 Malignant neoplasm of unspecified part of unspecified bronchus or lung: Secondary | ICD-10-CM | POA: Diagnosis not present

## 2014-06-13 MED ORDER — IOHEXOL 300 MG/ML  SOLN
80.0000 mL | Freq: Once | INTRAMUSCULAR | Status: AC | PRN
  Administered 2014-06-13: 80 mL via INTRAVENOUS

## 2014-06-16 ENCOUNTER — Other Ambulatory Visit (HOSPITAL_BASED_OUTPATIENT_CLINIC_OR_DEPARTMENT_OTHER): Payer: Medicare Other

## 2014-06-16 ENCOUNTER — Encounter (HOSPITAL_COMMUNITY)
Admission: RE | Admit: 2014-06-16 | Discharge: 2014-06-16 | Disposition: A | Discharge status: Home / Self Care | Payer: Medicare Other | Source: Ambulatory Visit | Attending: Internal Medicine | Admitting: Internal Medicine

## 2014-06-16 ENCOUNTER — Encounter (HOSPITAL_COMMUNITY): Payer: Self-pay

## 2014-06-16 VITALS — BP 112/70 | HR 108 | Resp 18 | Ht 66.0 in | Wt 153.9 lb

## 2014-06-16 DIAGNOSIS — C342 Malignant neoplasm of middle lobe, bronchus or lung: Secondary | ICD-10-CM | POA: Diagnosis not present

## 2014-06-16 DIAGNOSIS — R0609 Other forms of dyspnea: Secondary | ICD-10-CM | POA: Insufficient documentation

## 2014-06-16 DIAGNOSIS — R0989 Other specified symptoms and signs involving the circulatory and respiratory systems: Secondary | ICD-10-CM | POA: Insufficient documentation

## 2014-06-16 DIAGNOSIS — C3490 Malignant neoplasm of unspecified part of unspecified bronchus or lung: Secondary | ICD-10-CM | POA: Insufficient documentation

## 2014-06-16 DIAGNOSIS — R222 Localized swelling, mass and lump, trunk: Secondary | ICD-10-CM | POA: Diagnosis not present

## 2014-06-16 LAB — CBC WITH DIFFERENTIAL/PLATELET
BASO%: 1.1 % (ref 0.0–2.0)
BASOS ABS: 0.1 10*3/uL (ref 0.0–0.1)
EOS ABS: 0.2 10*3/uL (ref 0.0–0.5)
EOS%: 2.8 % (ref 0.0–7.0)
HEMATOCRIT: 36 % (ref 34.8–46.6)
HEMOGLOBIN: 11.9 g/dL (ref 11.6–15.9)
LYMPH#: 0.7 10*3/uL — AB (ref 0.9–3.3)
LYMPH%: 8.3 % — ABNORMAL LOW (ref 14.0–49.7)
MCH: 32.3 pg (ref 25.1–34.0)
MCHC: 33.2 g/dL (ref 31.5–36.0)
MCV: 97.3 fL (ref 79.5–101.0)
MONO#: 0.8 10*3/uL (ref 0.1–0.9)
MONO%: 9.6 % (ref 0.0–14.0)
NEUT%: 78.2 % — AB (ref 38.4–76.8)
NEUTROS ABS: 6.8 10*3/uL — AB (ref 1.5–6.5)
PLATELETS: 226 10*3/uL (ref 145–400)
RBC: 3.7 10*6/uL (ref 3.70–5.45)
RDW: 16.6 % — ABNORMAL HIGH (ref 11.2–14.5)
WBC: 8.7 10*3/uL (ref 3.9–10.3)

## 2014-06-16 LAB — COMPREHENSIVE METABOLIC PANEL (CC13)
ALT: 25 U/L (ref 0–55)
ANION GAP: 10 meq/L (ref 3–11)
AST: 21 U/L (ref 5–34)
Albumin: 3.5 g/dL (ref 3.5–5.0)
Alkaline Phosphatase: 101 U/L (ref 40–150)
BUN: 14.3 mg/dL (ref 7.0–26.0)
CALCIUM: 9.6 mg/dL (ref 8.4–10.4)
CHLORIDE: 104 meq/L (ref 98–109)
CO2: 25 meq/L (ref 22–29)
CREATININE: 0.8 mg/dL (ref 0.6–1.1)
GLUCOSE: 193 mg/dL — AB (ref 70–140)
Potassium: 3.5 mEq/L (ref 3.5–5.1)
Sodium: 139 mEq/L (ref 136–145)
Total Bilirubin: 0.63 mg/dL (ref 0.20–1.20)
Total Protein: 6.7 g/dL (ref 6.4–8.3)

## 2014-06-16 NOTE — Progress Notes (Signed)
Megan [name] 73 y.o. female Pulmonary Rehab Orientation Note Patient arrived today in [facility] for orientation to Pulmonary Rehab. She ambulated from [place] parking with little difficulty. She does not carry portable oxygen. Per pt, she uses oxygen never. Color good, skin warm and dry. Patient is oriented to time and place. Patient's medical history and medications reviewed. Heart rate is normal, breath sounds clear to auscultation, no wheezes, rales, or rhonchi, diminished in the right . Grip strength equal, strong. Patient does have RA which affects both hands and fingers. Distal pulses palpable. Patient reports she does take medications as prescribed. Patient states she follows a Regular. The patient reports no specific efforts to gain or lose weight. She does report loosing 20 plus pounds since her diagnosis of lung CA and chemo/radiation treatments. She describes esophogeal burn from the radiation which is now resolving with soft foods and a Protonix. She states she has to have her esophagus stretched in the near future r/t inability to swallow large pills. Patient's weight will be monitored closely. Demonstration and practice of PLB using pulse oximeter. Patient able to return demonstration satisfactorily. Safety and hand hygiene in the exercise area reviewed with patient. Patient voices understanding of the information reviewed. Department expectations discussed with patient and achievable goals were set. The patient shows enthusiasm about attending the program and we look forward to working with this nice gentleman. The patient is scheduled for a 6 min walk test on Thursday 8/20 at 3:45 and to begin exercise on Tuesday 8/25 at 10:30.   45 minutes was spent on a variety of activities such as assessment of the patient, obtaining baseline data including height, weight, BMI, and grip strength, verifying medical history, allergies, and current medications, and teaching patient  strategies for performing tasks with less respiratory effort with emphasis on pursed lip breathing.

## 2014-06-19 ENCOUNTER — Encounter (HOSPITAL_COMMUNITY)
Admission: RE | Admit: 2014-06-19 | Discharge: 2014-06-19 | Disposition: A | Discharge status: Home / Self Care | Payer: Medicare Other | Source: Ambulatory Visit | Attending: Internal Medicine | Admitting: Internal Medicine

## 2014-06-19 DIAGNOSIS — R222 Localized swelling, mass and lump, trunk: Secondary | ICD-10-CM | POA: Diagnosis not present

## 2014-06-19 NOTE — Progress Notes (Signed)
Megan [name] completed a Six-Minute Walk Test on 06/19/14 . Megan [name] walked 1,172 feet with 0 breaks.  The patient's lowest oxygen saturation was 91% , highest heart rate was 107 bpm , and highest blood pressure was 132/70. The patient was on room air.  Megan [name] stated that leg fatigue hindered their walk test.

## 2014-06-23 ENCOUNTER — Ambulatory Visit (HOSPITAL_BASED_OUTPATIENT_CLINIC_OR_DEPARTMENT_OTHER): Payer: Medicare Other | Admitting: Physician Assistant

## 2014-06-23 ENCOUNTER — Encounter: Payer: Self-pay | Admitting: Physician Assistant

## 2014-06-23 ENCOUNTER — Telehealth: Payer: Self-pay | Admitting: Physician Assistant

## 2014-06-23 ENCOUNTER — Telehealth: Payer: Self-pay | Admitting: *Deleted

## 2014-06-23 ENCOUNTER — Other Ambulatory Visit (HOSPITAL_BASED_OUTPATIENT_CLINIC_OR_DEPARTMENT_OTHER): Payer: Medicare Other

## 2014-06-23 VITALS — BP 132/65 | HR 89 | Temp 97.3°F | Resp 19 | Ht 66.0 in | Wt 156.7 lb

## 2014-06-23 DIAGNOSIS — Z79899 Other long term (current) drug therapy: Secondary | ICD-10-CM

## 2014-06-23 DIAGNOSIS — R5381 Other malaise: Secondary | ICD-10-CM

## 2014-06-23 DIAGNOSIS — R0989 Other specified symptoms and signs involving the circulatory and respiratory systems: Secondary | ICD-10-CM

## 2014-06-23 DIAGNOSIS — C3490 Malignant neoplasm of unspecified part of unspecified bronchus or lung: Secondary | ICD-10-CM

## 2014-06-23 DIAGNOSIS — R5383 Other fatigue: Secondary | ICD-10-CM

## 2014-06-23 DIAGNOSIS — G609 Hereditary and idiopathic neuropathy, unspecified: Secondary | ICD-10-CM

## 2014-06-23 DIAGNOSIS — R0609 Other forms of dyspnea: Secondary | ICD-10-CM

## 2014-06-23 DIAGNOSIS — C342 Malignant neoplasm of middle lobe, bronchus or lung: Secondary | ICD-10-CM

## 2014-06-23 DIAGNOSIS — R05 Cough: Secondary | ICD-10-CM

## 2014-06-23 DIAGNOSIS — R350 Frequency of micturition: Secondary | ICD-10-CM

## 2014-06-23 DIAGNOSIS — C770 Secondary and unspecified malignant neoplasm of lymph nodes of head, face and neck: Secondary | ICD-10-CM

## 2014-06-23 DIAGNOSIS — R059 Cough, unspecified: Secondary | ICD-10-CM

## 2014-06-23 LAB — COMPREHENSIVE METABOLIC PANEL (CC13)
ALT: 20 U/L (ref 0–55)
AST: 16 U/L (ref 5–34)
Albumin: 3.4 g/dL — ABNORMAL LOW (ref 3.5–5.0)
Alkaline Phosphatase: 96 U/L (ref 40–150)
Anion Gap: 9 mEq/L (ref 3–11)
BILIRUBIN TOTAL: 0.58 mg/dL (ref 0.20–1.20)
BUN: 14.9 mg/dL (ref 7.0–26.0)
CALCIUM: 9.3 mg/dL (ref 8.4–10.4)
CO2: 24 mEq/L (ref 22–29)
CREATININE: 0.8 mg/dL (ref 0.6–1.1)
Chloride: 108 mEq/L (ref 98–109)
Glucose: 157 mg/dl — ABNORMAL HIGH (ref 70–140)
Potassium: 4 mEq/L (ref 3.5–5.1)
Sodium: 141 mEq/L (ref 136–145)
Total Protein: 6.5 g/dL (ref 6.4–8.3)

## 2014-06-23 LAB — CBC WITH DIFFERENTIAL/PLATELET
BASO%: 0.8 % (ref 0.0–2.0)
Basophils Absolute: 0.1 10*3/uL (ref 0.0–0.1)
EOS%: 5.7 % (ref 0.0–7.0)
Eosinophils Absolute: 0.3 10*3/uL (ref 0.0–0.5)
HEMATOCRIT: 35 % (ref 34.8–46.6)
HGB: 11.6 g/dL (ref 11.6–15.9)
LYMPH%: 12.5 % — AB (ref 14.0–49.7)
MCH: 32 pg (ref 25.1–34.0)
MCHC: 33.1 g/dL (ref 31.5–36.0)
MCV: 96.4 fL (ref 79.5–101.0)
MONO#: 0.6 10*3/uL (ref 0.1–0.9)
MONO%: 9.3 % (ref 0.0–14.0)
NEUT#: 4.3 10*3/uL (ref 1.5–6.5)
NEUT%: 71.7 % (ref 38.4–76.8)
NRBC: 0 % (ref 0–0)
PLATELETS: 180 10*3/uL (ref 145–400)
RBC: 3.63 10*6/uL — AB (ref 3.70–5.45)
RDW: 15.8 % — ABNORMAL HIGH (ref 11.2–14.5)
WBC: 6 10*3/uL (ref 3.9–10.3)
lymph#: 0.8 10*3/uL — ABNORMAL LOW (ref 0.9–3.3)

## 2014-06-23 LAB — TECHNOLOGIST REVIEW

## 2014-06-23 NOTE — Telephone Encounter (Signed)
Pt confirmed labs/ov per 08/24 POF, sent msg to add chemo, gave pt AVS....KJ

## 2014-06-23 NOTE — Progress Notes (Addendum)
[place] [phone]   [phone]  SHARED VISIT PROGRESS NOTE  [name], MD [address] [place] [address]  DIAGNOSIS: Squamous cell lung cancer  Primary site: Lung (Right)  Staging method: AJCC 7th Edition  Clinical: Stage IIIB (T3, N3, M0)  Summary: Stage IIIB (T3, N3, M0)   PRIOR THERAPY: Concurrent chemoradiation with weekly chemotherapy in the form of carboplatin for AUC of 2 and paclitaxel 45 mg/m2. Status post 7 week of treatment.  Last dose was given 01/27/2014 no significant response in her disease.  CURRENT THERAPY: Consolidation chemotherapy with carboplatin for AUC of 5 and paclitaxel 175 mg/M2 every 3 weeks with Neulasta support. First dose on 04/15/2014. Status post 3 cycles. Carboplatin was discontinued starting cycle #2 secondary to hypersensitivity reaction.  CHEMOTHERAPY INTENT: Control/Palliative  CURRENT # OF CHEMOTHERAPY CYCLES: 3 CURRENT ANTIEMETICS: Zofran, dexamethasone, Compazine  CURRENT SMOKING STATUS: Former smoker, quit 12/05/1997  ORAL CHEMOTHERAPY AND CONSENT: n/a  CURRENT BISPHOSPHONATES USE: None  PAIN MANAGEMENT: no cancer related pain, patient does have RA  NARCOTICS INDUCED CONSTIPATION: none  LIVING WILL AND CODE STATUS: ?   INTERVAL HISTORY: Megan [name] 73 y.o. female returns to the clinic today for followup visit. She is accompanied by her friend today. She continues to complain of a dry cough and has shortness of breath with exertion. She was having some urinary frequency but this is improving. She denied fever or chills. She complains of leg weakness. She reports that she is to start physical therapy as ordered by Dr. Chase Caller. She notes some swelling in her ankles. This also is resolving. She has had some high sodium foods lately. She voices no other specific complaints today. She continue to have some difficulty with her esophagus and is followed by  gastroenterology. The patient denied having any significant weight loss or night sweats. She has no current nausea or vomiting. She has no fever or chills. She denied having any significant chest pain but continues to have shortness breath with exertion with  cough but no hemoptysis. She recently had a restaging CT scan of the chest and presents to discuss the results.  MEDICAL HISTORY: Past Medical History  Diagnosis Date  . Hypertension   . Diabetes mellitus   . Rheumatoid arthritis   . GERD (gastroesophageal reflux disease)     no meds for  . Hx of radiation therapy 12/16/13-01/30/14    lung 66Gy  . Cancer     LUNG    ALLERGIES:  is allergic to remicade and carboplatin.  MEDICATIONS:  Current Outpatient Prescriptions  Medication Sig Dispense Refill  . Biotin 5000 MCG CAPS Take by mouth.      . Fluticasone-Salmeterol (ADVAIR DISKUS) 100-50 MCG/DOSE AEPB Inhale 1 puff into the lungs 2 (two) times daily.  60 each  6  . losartan-hydrochlorothiazide (HYZAAR) 100-25 MG per tablet Take 1 tablet by mouth daily.      . magnesium oxide (MAG-OX) 400 MG tablet Take 400 mg by mouth daily.      . meloxicam (MOBIC) 15 MG tablet Take 15 mg by mouth daily.      [name] POTASSIUM CHLORIDE PO Take by mouth.      . prochlorperazine (COMPAZINE) 10 MG tablet TAKE 1 TABLET BY MOUTH EVERY 6 HOURS AS NEEDED FOR NAUSEA OR VOMITING  60 tablet  0  . sucralfate (CARAFATE) 1 GM/10ML suspension Take 10 mL by mouth 4 times daily before meals.  240 mL  0  . vitamin B-12 (CYANOCOBALAMIN) 100 MCG tablet Take 100 mcg by mouth daily.      [name] co-enzyme Q-10 50 MG capsule Take 50 mg by mouth daily.      . pantoprazole (PROTONIX) 40 MG tablet Take 1 tablet (40 mg total) by mouth daily.  90 tablet  1   No current facility-administered medications for this visit.   Facility-Administered Medications Ordered in Other Visits  Medication Dose Route Frequency Provider Last Rate Last Dose  . sodium chloride 0.9 % injection 10 mL  10  mL Intracatheter PRN Curt Bears, MD        SURGICAL HISTORY:  Past Surgical History  Procedure Laterality Date  . Cholecystectomy    . Abdominal hysterectomy    . Dilation and curettage of uterus    . Abdominal surgery      REVIEW OF SYSTEMS:  Constitutional: negative Eyes: negative Ears, nose, mouth, throat, and face: negative Respiratory: positive for cough and dyspnea on exertion Cardiovascular: negative Gastrointestinal: positive for dyspepsia and odynophagia Genitourinary:positive for frequency Integument/breast: negative Hematologic/lymphatic: negative Musculoskeletal:positive for muscle weakness Neurological: negative Behavioral/Psych: negative Endocrine: negative Allergic/Immunologic: negative   PHYSICAL EXAMINATION: General appearance: alert, cooperative and no distress Head: Normocephalic, without obvious abnormality, atraumatic Neck: no adenopathy, no JVD, supple, symmetrical, trachea midline and thyroid not enlarged, symmetric, no tenderness/mass/nodules Lymph nodes: Cervical, supraclavicular, and axillary nodes normal. Resp: clear to auscultation bilaterally Back: symmetric, no curvature. ROM normal. No CVA tenderness. Cardio: regular rate and rhythm, S1, S2 normal, no murmur, click, rub or gallop GI: soft, non-tender; bowel sounds normal; no masses,  no organomegaly Extremities: extremities normal, atraumatic, no cyanosis or edema Neurologic: Alert and oriented X 3, normal strength and tone. Normal symmetric reflexes. Normal coordination and gait  ECOG PERFORMANCE STATUS: 1 - Symptomatic but completely ambulatory  Blood pressure 132/65, pulse 89, temperature 97.3 F (36.3 C), temperature source Oral, resp. rate 19, height 5\' 6"  (1.676 m), weight 156 lb 11.2 oz (71.079 kg).  LABORATORY DATA: Lab Results  Component Value Date   WBC 6.0 06/23/2014   HGB 11.6 06/23/2014   HCT 35.0 06/23/2014   MCV 96.4 06/23/2014   PLT 180 06/23/2014      Chemistry        Component Value Date/Time   NA 141 06/23/2014 0955   NA 136 06/09/2014 0928   K 4.0 06/23/2014 0955   K 3.3* 06/09/2014 0928   CL 101 06/09/2014 0928   CO2 24 06/23/2014 0955   CO2 22 06/09/2014 0928   BUN 14.9 06/23/2014 0955   BUN 13 06/09/2014 0928   CREATININE 0.8 06/23/2014 0955   CREATININE 0.87 06/09/2014 0928      Component Value Date/Time   CALCIUM 9.3 06/23/2014 0955   CALCIUM 8.9 06/09/2014 0928   ALKPHOS 96 06/23/2014 0955   ALKPHOS 106 06/09/2014 0928   AST 16 06/23/2014 0955   AST 16 06/09/2014 0928   ALT 20 06/23/2014 0955   ALT 21 06/09/2014 0928   BILITOT 0.58 06/23/2014 0955   BILITOT 0.5 06/09/2014 0928       RADIOGRAPHIC STUDIES: Ct Chest W Contrast  06/13/2014   CLINICAL DATA:  Re- states lung cancer.  EXAM: CT CHEST WITH CONTRAST  TECHNIQUE: Multidetector CT imaging of the chest was performed during intravenous contrast administration.  CONTRAST:  29mL OMNIPAQUE IOHEXOL 300 MG/ML  SOLN  COMPARISON:  CT chest 02/28/2014  FINDINGS: Re- demonstrated 2 cm heterogeneous left thyroid mass. Normal heart size. No pericardial  effusion. Small hiatal hernia. Calcified mediastinal lymph nodes.  Interval development of right middle lobe and partial right upper lobe collapse which largely obscures the expected location of the previously described right upper lobe mass making measurement and comparison with prior difficult. This is likely secondary to a new 1.6 cm right hilar soft tissue mass. There is central lower attenuation within the region of atelectasis which measures 3.8 x 2.1 cm, favored to represent the previously described mass which is somewhat increased in size when compared to prior were it measured 2.9 x 2.7 cm.  There is abrupt cut off of the right middle lobe bronchus. Interval development of a 1.4 cm subpleural nodular opacity within the right lower lobe (image 29). Additionally there has been interval development of multiple mixed solid and ground-glass opacities within the right  lower lobe measuring 2.4 cm, 1.8 cm and 1.4 cm respectively. Unchanged calcified nodule within the peripheral aspect of the left lower lobe.  Imaging of the upper abdomen demonstrates unchanged low-attenuation hepatic lesions measuring 1.6 cm and 0.9 cm, favored to represent cysts. Status post cholecystectomy. Normal adrenal glands. Calcified granulomas in the spleen.  Mid thoracic spine degenerative change. No aggressive or acute appearing osseous lesions.  IMPRESSION: 1. Interval development of right middle lobe and partial right upper lobe atelectasis likely secondary to new right hilar soft tissue mass. The atelectasis largely obscures the previously described right upper lobe mass however the mass in the right upper lobe does appear to have increased in size when compared to prior examination. Additionally there is new abrupt cut off of the right middle lobe bronchus resulting in the above described right middle lobe atelectasis. Overall findings are concerning for disease progression. 2. Interval development of subpleural soft tissue nodule within the right lower lobe concerning for additional site of disease. An additional consideration given the location and appearance would be pulmonary infarct although this is felt to be less likely. 3. Interval development of multiple large solid and ground-glass opacities within the aerated right lower lobe. These may potentially be infectious, inflammatory or post treatment related however metastatic disease is of concern.   Electronically Signed   By: Lovey Newcomer M.D.   On: 06/13/2014 11:14    ASSESSMENT AND PLAN: This is a very pleasant 73 years old white female with history of stage IIIB non-small cell lung cancer status post a course of concurrent chemoradiation with weekly carboplatin and paclitaxel status post 7 cycles. The patient tolerated her treatment fairly well with no significant adverse effects except for the radiation induced esophagitis. She received  3 cycles of consolidation chemotherapy with carboplatin and paclitaxel however carboplatin was discontinued with cycle #2 secondary to hypersensitivity reaction. Her restaging CT scan of the chest reveals evidence for disease progression. The results were discussed with patient and her friend.  Patient was discussed with an also seen by Dr. Julien Nordmann. We will plan to proceed with immunotherapy with nivolumab.risks and benefits of this treatment were discussed with the patient including but not limited to diarrhea, skin rash, thyroid abnormalities and breathing difficulties related to pneumonitis. Patient voiced understanding and wishes to proceed, a verbal consent, with immunotherapy with nivolumab.. We anticipate the first cycle of Nivolumab to be given 06/30/14. She will follow up in 3 weeks prior to cycle #2. She was advised to call immediately if she has any concerning symptoms in the interval. The patient voices understanding of current disease status and treatment options and is in agreement with the current care plan.  All questions were answered. The patient knows to call the clinic with any problems, questions or concerns. We can certainly see the patient much sooner if necessary.  Disclaimer: This note was dictated with voice recognition software. Similar sounding words can inadvertently be transcribed and may not be corrected upon review. Carlton Adam, PA-C 06/23/2014  ADDENDUM: Hematology/Oncology Attending: I had a face to face encounter with the patient today. I recommended her care plan. This is a very pleasant 73 years old white female with progressive non-small cell lung cancer, squamous cell carcinoma status post a course of concurrent chemoradiation followed by 3 cycles of consolidation chemotherapy initially with carboplatin and paclitaxel during cycle #1 but the patient continues with 2 more cycles of single agent paclitaxel secondary to hypersensitivity reaction to  carboplatin. She tolerated her treatment fairly well except for mild fatigue and peripheral neuropathy. Her recent CT scan of the chest showed evidence for disease progression. I discussed the scan results with the patient today. I discussed with her several options for treatment of her condition including systemic chemotherapy with single agent gemcitabine versus treatment with immunotherapy with Nivolumab 3mg /KG.  The patient is interested in proceeding with immunotherapy. I discussed with her the adverse effect of this treatment including skin rash, diarrhea, liver or renal dysfunction as well as endocrine dysfunction. She would like to proceed with treatment as planned and she gives a verbal consent. She is expected to start the first cycle of this treatment next week after we receive authorization from her insurance company. She would come back for follow up visit in 3 weeks with the start of cycle #2. She was advised to call immediately if she has any concerning symptoms in the interval.  Disclaimer: This note was dictated with voice recognition software. Similar sounding words can inadvertently be transcribed and may be missed upon review. Eilleen Kempf., MD 06/23/2014

## 2014-06-23 NOTE — Patient Instructions (Signed)
Your CT scan revealed evidence of disease progression You will begin immunotherapy with Nivolumab on 06/30/2014 Follow up in 3 weeks, prior to the start of cycle #2

## 2014-06-23 NOTE — Telephone Encounter (Signed)
Per staff message and POF I have scheduled appts. Advised scheduler of appts. JMW  

## 2014-06-24 ENCOUNTER — Encounter (HOSPITAL_COMMUNITY)
Admission: RE | Admit: 2014-06-24 | Discharge: 2014-06-24 | Disposition: A | Discharge status: Home / Self Care | Payer: Medicare Other | Source: Ambulatory Visit | Attending: Internal Medicine | Admitting: Internal Medicine

## 2014-06-24 DIAGNOSIS — R222 Localized swelling, mass and lump, trunk: Secondary | ICD-10-CM | POA: Diagnosis not present

## 2014-06-24 NOTE — Progress Notes (Signed)
Today, Roger exercised at [facility]. Service time was from 1030 to 1215.  The patient exercised for more than 31 minutes performing aerobic, strengthening, and stretching exercises. Oxygen saturation, heart rate, blood pressure, rate of perceived exertion, and shortness of breath were all monitored before, during, and after exercise. Miche presented with no problems at today's exercise session.   There was no workload change during today's exercise session.  Pre-exercise vitals:   Weight kg: 71.1   Liters of O2: ra   SpO2: 96   HR: 98   BP: 130/88   CBG: na  Exercise vitals:   Highest heartrate:  109   Lowest oxygen saturation: 93   Highest blood pressure: 124/70   Liters of 02: ra  Post-exercise vitals:   SpO2: 96   HR: 92   BP: 114/60   Liters of O2: ra   CBG: na  Dr. Brand Males, Medical Director Dr. Algis Liming is immediately available during today's Pulmonary Rehab session for Beaulah Dinning on 06/24/2014 at 1030 class time.

## 2014-06-26 ENCOUNTER — Encounter (HOSPITAL_COMMUNITY)
Admission: RE | Admit: 2014-06-26 | Discharge: 2014-06-26 | Disposition: A | Discharge status: Home / Self Care | Payer: Medicare Other | Source: Ambulatory Visit | Attending: Internal Medicine | Admitting: Internal Medicine

## 2014-06-26 DIAGNOSIS — R222 Localized swelling, mass and lump, trunk: Secondary | ICD-10-CM | POA: Diagnosis not present

## 2014-06-26 NOTE — Progress Notes (Signed)
Today, Kylena exercised at [facility]. Service time was from 1100 to 1215.  The patient exercised for more than 31 minutes performing aerobic, strengthening, and stretching exercises. Oxygen saturation, heart rate, blood pressure, rate of perceived exertion, and shortness of breath were all monitored before, during, and after exercise. Jamilia presented with no problems at today's exercise session.   There was no workload change during today's exercise session.  Pre-exercise vitals:   Weight kg: 69.3   Liters of O2: ra   SpO2: 95   HR: 112   BP: 98/60   CBG: na  Exercise vitals:   Highest heartrate:  127 down to 112 with rest break   Lowest oxygen saturation: 91 increased to 97 with PLB and rest break   Highest blood pressure: 98/60   Liters of 02: ra  Post-exercise vitals:   SpO2: 96   HR: 112   BP: 114/60   Liters of O2: ra   CBG: na  Dr. Brand Males, Medical Director Dr. Broadus John is immediately available during today's Pulmonary Rehab session for Beaulah Dinning on 06/26/2014 at 1100 class time.

## 2014-06-30 ENCOUNTER — Ambulatory Visit (HOSPITAL_BASED_OUTPATIENT_CLINIC_OR_DEPARTMENT_OTHER): Payer: Medicare Other

## 2014-06-30 ENCOUNTER — Other Ambulatory Visit (HOSPITAL_BASED_OUTPATIENT_CLINIC_OR_DEPARTMENT_OTHER): Payer: Medicare Other

## 2014-06-30 VITALS — BP 107/60 | HR 87 | Temp 97.2°F | Resp 20

## 2014-06-30 DIAGNOSIS — C3490 Malignant neoplasm of unspecified part of unspecified bronchus or lung: Secondary | ICD-10-CM

## 2014-06-30 DIAGNOSIS — C342 Malignant neoplasm of middle lobe, bronchus or lung: Secondary | ICD-10-CM

## 2014-06-30 DIAGNOSIS — C770 Secondary and unspecified malignant neoplasm of lymph nodes of head, face and neck: Secondary | ICD-10-CM

## 2014-06-30 DIAGNOSIS — Z5112 Encounter for antineoplastic immunotherapy: Secondary | ICD-10-CM

## 2014-06-30 DIAGNOSIS — Z79899 Other long term (current) drug therapy: Secondary | ICD-10-CM

## 2014-06-30 LAB — CBC WITH DIFFERENTIAL/PLATELET
BASO%: 0.8 % (ref 0.0–2.0)
Basophils Absolute: 0.1 10*3/uL (ref 0.0–0.1)
EOS%: 9.1 % — ABNORMAL HIGH (ref 0.0–7.0)
Eosinophils Absolute: 0.6 10*3/uL — ABNORMAL HIGH (ref 0.0–0.5)
HCT: 36.1 % (ref 34.8–46.6)
HEMOGLOBIN: 12.1 g/dL (ref 11.6–15.9)
LYMPH%: 13.9 % — AB (ref 14.0–49.7)
MCH: 31.8 pg (ref 25.1–34.0)
MCHC: 33.5 g/dL (ref 31.5–36.0)
MCV: 94.8 fL (ref 79.5–101.0)
MONO#: 0.6 10*3/uL (ref 0.1–0.9)
MONO%: 9.1 % (ref 0.0–14.0)
NEUT%: 67.1 % (ref 38.4–76.8)
NEUTROS ABS: 4.3 10*3/uL (ref 1.5–6.5)
PLATELETS: 175 10*3/uL (ref 145–400)
RBC: 3.81 10*6/uL (ref 3.70–5.45)
RDW: 15.1 % — AB (ref 11.2–14.5)
WBC: 6.3 10*3/uL (ref 3.9–10.3)
lymph#: 0.9 10*3/uL (ref 0.9–3.3)

## 2014-06-30 LAB — COMPREHENSIVE METABOLIC PANEL (CC13)
ALK PHOS: 95 U/L (ref 40–150)
ALT: 23 U/L (ref 0–55)
AST: 18 U/L (ref 5–34)
Albumin: 3.7 g/dL (ref 3.5–5.0)
Anion Gap: 10 mEq/L (ref 3–11)
BUN: 13.8 mg/dL (ref 7.0–26.0)
CO2: 22 mEq/L (ref 22–29)
Calcium: 9.6 mg/dL (ref 8.4–10.4)
Chloride: 106 mEq/L (ref 98–109)
Creatinine: 0.8 mg/dL (ref 0.6–1.1)
GLUCOSE: 159 mg/dL — AB (ref 70–140)
Potassium: 3.2 mEq/L — ABNORMAL LOW (ref 3.5–5.1)
SODIUM: 138 meq/L (ref 136–145)
Total Bilirubin: 0.95 mg/dL (ref 0.20–1.20)
Total Protein: 6.8 g/dL (ref 6.4–8.3)

## 2014-06-30 LAB — TSH CHCC: TSH: 0.145 m[IU]/L — AB (ref 0.308–3.960)

## 2014-06-30 MED ORDER — SODIUM CHLORIDE 0.9 % IV SOLN
Freq: Once | INTRAVENOUS | Status: AC
  Administered 2014-06-30: 11:00:00 via INTRAVENOUS

## 2014-06-30 MED ORDER — SODIUM CHLORIDE 0.9 % IV SOLN
3.0000 mg/kg | Freq: Once | INTRAVENOUS | Status: AC
  Administered 2014-06-30: 200 mg via INTRAVENOUS
  Filled 2014-06-30: qty 20

## 2014-06-30 MED ORDER — SODIUM CHLORIDE 0.9 % IV SOLN: 3.0000 mg/kg | Freq: Once | INTRAVENOUS | Status: DC

## 2014-06-30 NOTE — Patient Instructions (Signed)
[facility] Discharge Instructions for Patients Receiving Chemotherapy  Today you received the following chemotherapy agents Nivolumab  To help prevent nausea and vomiting after your treatment, we encourage you to take your nausea medication  Compazine 10 mg my mouth as needed for nausea every 6 hours.   If you develop nausea and vomiting that is not controlled by your nausea medication, call the clinic.   BELOW ARE SYMPTOMS THAT SHOULD BE REPORTED IMMEDIATELY:  *FEVER GREATER THAN 100.5 F  *CHILLS WITH OR WITHOUT FEVER  NAUSEA AND VOMITING THAT IS NOT CONTROLLED WITH YOUR NAUSEA MEDICATION  *UNUSUAL SHORTNESS OF BREATH  *UNUSUAL BRUISING OR BLEEDING  TENDERNESS IN MOUTH AND THROAT WITH OR WITHOUT PRESENCE OF ULCERS  *URINARY PROBLEMS  *BOWEL PROBLEMS  UNUSUAL RASH Items with * indicate a potential emergency and should be followed up as soon as possible.  Feel free to call the clinic you have any questions or concerns. The clinic phone number is [phone].

## 2014-07-01 ENCOUNTER — Other Ambulatory Visit: Payer: Self-pay | Admitting: Medical Oncology

## 2014-07-01 ENCOUNTER — Telehealth: Payer: Self-pay | Admitting: *Deleted

## 2014-07-01 ENCOUNTER — Encounter (HOSPITAL_COMMUNITY)
Admission: RE | Admit: 2014-07-01 | Discharge: 2014-07-01 | Disposition: A | Discharge status: Home / Self Care | Payer: Medicare Other | Source: Ambulatory Visit | Attending: Internal Medicine | Admitting: Internal Medicine

## 2014-07-01 DIAGNOSIS — C342 Malignant neoplasm of middle lobe, bronchus or lung: Secondary | ICD-10-CM | POA: Diagnosis not present

## 2014-07-01 DIAGNOSIS — C3490 Malignant neoplasm of unspecified part of unspecified bronchus or lung: Secondary | ICD-10-CM | POA: Insufficient documentation

## 2014-07-01 DIAGNOSIS — R0989 Other specified symptoms and signs involving the circulatory and respiratory systems: Secondary | ICD-10-CM | POA: Insufficient documentation

## 2014-07-01 DIAGNOSIS — R0609 Other forms of dyspnea: Secondary | ICD-10-CM | POA: Insufficient documentation

## 2014-07-01 DIAGNOSIS — R222 Localized swelling, mass and lump, trunk: Secondary | ICD-10-CM | POA: Diagnosis present

## 2014-07-01 NOTE — Telephone Encounter (Signed)
Message copied by Cherylynn Ridges on Tue Jul 01, 2014  2:44 PM ------      Message from: Cora Collum      Created: Mon Jun 30, 2014 10:43 AM      Regarding: follow up call      Contact: [phone]       Nivolumab 1st time Dr. Julien Nordmann ------

## 2014-07-01 NOTE — Telephone Encounter (Signed)
Message copied by Wardell Heath on Tue Jul 01, 2014 11:19 AM ------      Message from: Carlton Adam      Created: Tue Jul 01, 2014 10:12 AM       Abnormal results, please call in following prescription and notify patient  KCl 20 meq by mouth daily for 7 days ------

## 2014-07-01 NOTE — Telephone Encounter (Signed)
Left message for patient to call back regarding lab results.

## 2014-07-01 NOTE — Telephone Encounter (Signed)
Dana for chemotherapy F/U.  "I am not doing too good."  Is patient's response to f/u call.  Denies n/v but says she was given this monoclonal antibody and now has arthritic pain to both hips and knees.  "It's a tale of woe and I don't know if it's worth it with the side effects.  I also have neuropathy that started with the last treatment."  Took ES Tylenol yesterday and today has tried Mobic.  Unable to recall a name of another pain medicine she has tried.  'I went to my rehab class and was able to suffer through.  Having balance changes also."  Denies any further new side effects or symptoms.  Currently lying in bed with legs elevated.  Bowel and bladder is functioning well.  Eating and drinking well and I instructed to drink 64 oz minimum daily or at least the day before, of and after treatment.  Lives alone but does have a friend she can call if needed.  Instructed to get up slowly to get her bearings before getting up or changing position. Denies questions at this time and encouraged to call if needed.  Reviewed how to call after hours in the case of an emergency.

## 2014-07-01 NOTE — Progress Notes (Signed)
err

## 2014-07-01 NOTE — Telephone Encounter (Signed)
Called and informed patient of lab results and to continue taking potassium as prescribed by pcp.  Per Awilda Metro, PA.  Patient verbalized understanding.

## 2014-07-01 NOTE — Progress Notes (Signed)
Today, Megan [name] exercised at [facility]. Service time was from 1030 to 1215.  The patient exercised for more than 31 minutes performing aerobic, strengthening, and stretching exercises. Oxygen saturation, heart rate, blood pressure, rate of perceived exertion, and shortness of breath were all monitored before, during, and after exercise. Megan [name] presented with no problems at today's exercise session however she was more tired and sluggish than usual r/t first cancer treatment yesterday. She has a history of lung CA with unsuccessful chemo/radiation treatment and now, per patient, she is receiving a new IV "medication" for her cancer.  There was no workload change during today's exercise session.  Pre-exercise vitals:   Weight kg: 69.2   Liters of O2: ra   SpO2: 98   HR: 104   BP: 104/72   CBG: na  Exercise vitals:   Highest heartrate:  121   Lowest oxygen saturation: 93   Highest blood pressure: 114/70   Liters of 02: ra  Post-exercise vitals:   SpO2: 96   HR: 104   BP: 102/60   Liters of O2: ra   CBG: na  Dr. Brand Males, Medical Director Dr. Broadus John is immediately available during today's Pulmonary Rehab session for Megan [name] on 07/01/2014 at 1030 class time.

## 2014-07-03 ENCOUNTER — Encounter (HOSPITAL_COMMUNITY)
Admission: RE | Admit: 2014-07-03 | Discharge: 2014-07-03 | Disposition: A | Discharge status: Home / Self Care | Payer: Medicare Other | Source: Ambulatory Visit | Attending: Internal Medicine | Admitting: Internal Medicine

## 2014-07-03 DIAGNOSIS — R222 Localized swelling, mass and lump, trunk: Secondary | ICD-10-CM | POA: Diagnosis not present

## 2014-07-03 NOTE — Progress Notes (Signed)
Today, Megan [name] exercised at [facility]. Service time was from 1030 to 1230.  The patient exercised for more than 31 minutes performing aerobic, strengthening, and stretching exercises. Oxygen saturation, heart rate, blood pressure, rate of perceived exertion, and shortness of breath were all monitored before, during, and after exercise. Megan [name] presented with no problems at today's exercise session. Megan [name] also attended an educations session on home oxygen use and safety.   There was no workload change during today's exercise session.  Pre-exercise vitals:   Weight kg: 86.7   Liters of O2: ra   SpO2: 93   HR: 101   BP: 96/66   CBG: na  Exercise vitals:   Highest heartrate:  122 down to 116 with rest break   Lowest oxygen saturation: 93   Highest blood pressure: 98/58   Liters of 02: ra  Post-exercise vitals:   SpO2: 93   HR: 95   BP: 90/60   Liters of O2: ra   CBG: na  Dr. Brand Males, Medical Director Dr. Wendee Beavers is immediately available during today's Pulmonary Rehab session for Megan [name] on 07/03/2014 at 1030 class time.

## 2014-07-08 ENCOUNTER — Encounter (HOSPITAL_COMMUNITY)
Admission: RE | Admit: 2014-07-08 | Discharge: 2014-07-08 | Disposition: A | Discharge status: Home / Self Care | Payer: Medicare Other | Source: Ambulatory Visit | Attending: Internal Medicine | Admitting: Internal Medicine

## 2014-07-08 DIAGNOSIS — R222 Localized swelling, mass and lump, trunk: Secondary | ICD-10-CM | POA: Diagnosis not present

## 2014-07-08 NOTE — Progress Notes (Signed)
Today, Keva exercised at [facility]. Service time was from 10:30am to 12:15.  The patient exercised for more than 31 minutes performing aerobic, strengthening, and stretching exercises. Oxygen saturation, heart rate, blood pressure, rate of perceived exertion, and shortness of breath were all monitored before, during, and after exercise. Teneshia presented with no problems at today's exercise session.   There was an increase in workload change during today's exercise session.  Pre-exercise vitals:   Weight kg: 69.4   Liters of O2: ra   SpO2: 96   HR: 109   BP: 102/68   CBG: na  Exercise vitals:   Highest heartrate:  178 decreased workload and HR was 113   Lowest oxygen saturation: 90   Highest blood pressure: 132/78   Liters of 02: ra  Post-exercise vitals:   SpO2: 91   HR: 99   BP: 98/68   Liters of O2: ra   CBG: na  Dr. Brand Males, Medical Director Dr. Wendee Beavers is immediately available during today's Pulmonary Rehab session for Beaulah Dinning on 07/08/14 at 10:30am class time.

## 2014-07-10 ENCOUNTER — Encounter (HOSPITAL_COMMUNITY)
Admission: RE | Admit: 2014-07-10 | Discharge: 2014-07-10 | Disposition: A | Discharge status: Home / Self Care | Payer: Medicare Other | Source: Ambulatory Visit | Attending: Internal Medicine | Admitting: Internal Medicine

## 2014-07-10 DIAGNOSIS — R222 Localized swelling, mass and lump, trunk: Secondary | ICD-10-CM | POA: Diagnosis not present

## 2014-07-10 NOTE — Progress Notes (Signed)
Today, Megan [name] exercised at [facility]. Service time was from 10:30 to 12:30.  The patient exercised for more than 31 minutes performing aerobic, strengthening, and stretching exercises. Oxygen saturation, heart rate, blood pressure, rate of perceived exertion, and shortness of breath were all monitored before, during, and after exercise. Megan [name] presented with no problems at today's exercise session.  The patient attended education class on Nutrition with Derek Mound.  There was an increase in workload change during today's exercise session.  Pre-exercise vitals:   Weight kg: 69.3   Liters of O2: ra   SpO2: 96   HR: 96   BP: 116/58   CBG: na  Exercise vitals:   Highest heartrate:  104   Lowest oxygen saturation: 92   Highest blood pressure: 126/62   Liters of 02: ra  Post-exercise vitals:   SpO2: 95   HR: 97   BP: 99/49   Liters of O2: ra   CBG: na  Dr. Brand Males, Medical Director Dr. Coralyn Pear is immediately available during today's Pulmonary Rehab session for Megan [name] on 07/10/14 at 10:30am class time.

## 2014-07-14 ENCOUNTER — Ambulatory Visit (HOSPITAL_BASED_OUTPATIENT_CLINIC_OR_DEPARTMENT_OTHER): Payer: Medicare Other

## 2014-07-14 ENCOUNTER — Encounter: Payer: Self-pay | Admitting: Physician Assistant

## 2014-07-14 ENCOUNTER — Other Ambulatory Visit (HOSPITAL_BASED_OUTPATIENT_CLINIC_OR_DEPARTMENT_OTHER): Payer: Medicare Other

## 2014-07-14 ENCOUNTER — Ambulatory Visit (HOSPITAL_BASED_OUTPATIENT_CLINIC_OR_DEPARTMENT_OTHER): Payer: Medicare Other | Admitting: Physician Assistant

## 2014-07-14 VITALS — BP 116/68 | HR 109 | Temp 98.2°F | Resp 18 | Ht 66.0 in | Wt 151.0 lb

## 2014-07-14 DIAGNOSIS — Z23 Encounter for immunization: Secondary | ICD-10-CM

## 2014-07-14 DIAGNOSIS — C770 Secondary and unspecified malignant neoplasm of lymph nodes of head, face and neck: Secondary | ICD-10-CM

## 2014-07-14 DIAGNOSIS — C342 Malignant neoplasm of middle lobe, bronchus or lung: Secondary | ICD-10-CM

## 2014-07-14 DIAGNOSIS — R5381 Other malaise: Secondary | ICD-10-CM

## 2014-07-14 DIAGNOSIS — Z5112 Encounter for antineoplastic immunotherapy: Secondary | ICD-10-CM

## 2014-07-14 DIAGNOSIS — G609 Hereditary and idiopathic neuropathy, unspecified: Secondary | ICD-10-CM

## 2014-07-14 DIAGNOSIS — Z79899 Other long term (current) drug therapy: Secondary | ICD-10-CM

## 2014-07-14 DIAGNOSIS — R059 Cough, unspecified: Secondary | ICD-10-CM

## 2014-07-14 DIAGNOSIS — C3491 Malignant neoplasm of unspecified part of right bronchus or lung: Secondary | ICD-10-CM

## 2014-07-14 DIAGNOSIS — R5383 Other fatigue: Secondary | ICD-10-CM

## 2014-07-14 DIAGNOSIS — R05 Cough: Secondary | ICD-10-CM

## 2014-07-14 DIAGNOSIS — C3490 Malignant neoplasm of unspecified part of unspecified bronchus or lung: Secondary | ICD-10-CM

## 2014-07-14 LAB — CBC WITH DIFFERENTIAL/PLATELET
BASO%: 0.7 % (ref 0.0–2.0)
BASOS ABS: 0 10*3/uL (ref 0.0–0.1)
EOS ABS: 0.6 10*3/uL — AB (ref 0.0–0.5)
EOS%: 8.7 % — ABNORMAL HIGH (ref 0.0–7.0)
HEMATOCRIT: 37.8 % (ref 34.8–46.6)
HGB: 12.3 g/dL (ref 11.6–15.9)
LYMPH%: 9.2 % — AB (ref 14.0–49.7)
MCH: 30.9 pg (ref 25.1–34.0)
MCHC: 32.6 g/dL (ref 31.5–36.0)
MCV: 94.6 fL (ref 79.5–101.0)
MONO#: 0.6 10*3/uL (ref 0.1–0.9)
MONO%: 9.3 % (ref 0.0–14.0)
NEUT%: 72.1 % (ref 38.4–76.8)
NEUTROS ABS: 4.9 10*3/uL (ref 1.5–6.5)
PLATELETS: 206 10*3/uL (ref 145–400)
RBC: 3.99 10*6/uL (ref 3.70–5.45)
RDW: 15.5 % — ABNORMAL HIGH (ref 11.2–14.5)
WBC: 6.8 10*3/uL (ref 3.9–10.3)
lymph#: 0.6 10*3/uL — ABNORMAL LOW (ref 0.9–3.3)

## 2014-07-14 LAB — COMPREHENSIVE METABOLIC PANEL (CC13)
ALT: 12 U/L (ref 0–55)
ANION GAP: 13 meq/L — AB (ref 3–11)
AST: 13 U/L (ref 5–34)
Albumin: 3.5 g/dL (ref 3.5–5.0)
Alkaline Phosphatase: 92 U/L (ref 40–150)
BILIRUBIN TOTAL: 0.73 mg/dL (ref 0.20–1.20)
BUN: 10.1 mg/dL (ref 7.0–26.0)
CHLORIDE: 105 meq/L (ref 98–109)
CO2: 21 meq/L — AB (ref 22–29)
CREATININE: 0.8 mg/dL (ref 0.6–1.1)
Calcium: 9.8 mg/dL (ref 8.4–10.4)
GLUCOSE: 159 mg/dL — AB (ref 70–140)
Potassium: 3.6 mEq/L (ref 3.5–5.1)
Sodium: 139 mEq/L (ref 136–145)
Total Protein: 7.1 g/dL (ref 6.4–8.3)

## 2014-07-14 LAB — TECHNOLOGIST REVIEW

## 2014-07-14 LAB — TSH CHCC: TSH: 0.075 m(IU)/L — ABNORMAL LOW (ref 0.308–3.960)

## 2014-07-14 MED ORDER — SODIUM CHLORIDE 0.9 % IV SOLN
Freq: Once | INTRAVENOUS | Status: AC
  Administered 2014-07-14: 11:00:00 via INTRAVENOUS

## 2014-07-14 MED ORDER — INFLUENZA VAC SPLIT QUAD 0.5 ML IM SUSP: 0.5000 mL | Freq: Once | INTRAMUSCULAR | Status: DC

## 2014-07-14 MED ORDER — SODIUM CHLORIDE 0.9 % IV SOLN
200.0000 mg | Freq: Once | INTRAVENOUS | Status: AC
  Administered 2014-07-14: 200 mg via INTRAVENOUS
  Filled 2014-07-14: qty 20

## 2014-07-14 MED ORDER — INFLUENZA VAC SPLIT QUAD 0.5 ML IM SUSY
0.5000 mL | PREFILLED_SYRINGE | Freq: Once | INTRAMUSCULAR | Status: AC
  Administered 2014-07-14: 0.5 mL via INTRAMUSCULAR
  Filled 2014-07-14: qty 0.5

## 2014-07-14 NOTE — Patient Instructions (Signed)
[facility] Discharge Instructions for Patients Receiving Chemotherapy  Today you received the following chemotherapy agents: Nivolumab  To help prevent nausea and vomiting after your treatment, we encourage you to take your nausea medication as prescribed.    If you develop nausea and vomiting that is not controlled by your nausea medication, call the clinic.   BELOW ARE SYMPTOMS THAT SHOULD BE REPORTED IMMEDIATELY:  *FEVER GREATER THAN 100.5 F  *CHILLS WITH OR WITHOUT FEVER  NAUSEA AND VOMITING THAT IS NOT CONTROLLED WITH YOUR NAUSEA MEDICATION  *UNUSUAL SHORTNESS OF BREATH  *UNUSUAL BRUISING OR BLEEDING  TENDERNESS IN MOUTH AND THROAT WITH OR WITHOUT PRESENCE OF ULCERS  *URINARY PROBLEMS  *BOWEL PROBLEMS  UNUSUAL RASH Items with * indicate a potential emergency and should be followed up as soon as possible.  Feel free to call the clinic you have any questions or concerns. The clinic phone number is [phone].

## 2014-07-14 NOTE — Patient Instructions (Signed)
You'll receive reflecting today Followup in 2 weeks

## 2014-07-14 NOTE — Progress Notes (Addendum)
[facility] Telephone:([phone]   [phone]) [phone]  SHARED VISIT PROGRESS NOTE  Megan Bolt, MD [address] [place] [address] [place]  [address]  DIAGNOSIS: Squamous cell lung cancer  Primary site: Lung (Right)  Staging method: AJCC 7th Edition  Clinical: Stage IIIB (T3, N3, M0)  Summary: Stage IIIB (T3, N3, M0)   PRIOR THERAPY:  1. Concurrent chemoradiation with weekly chemotherapy in the form of carboplatin for AUC of 2 and paclitaxel 45 mg/m2. Status post 7 week of treatment.  Last dose was given 01/27/2014 no significant response in her disease.  2. Consolidation chemotherapy with carboplatin for AUC of 5 and paclitaxel 175 mg/M2 every 3 weeks with Neulasta support. First dose on 04/15/2014. Status post 3 cycles. Carboplatin was discontinued starting cycle #2 secondary to hypersensitivity reaction.  CURRENT THERAPY: Immunotherapy with Opdivo (Nivolumab) 3mg /kg given every 2 weeks. Status post 1 cycle  CHEMOTHERAPY INTENT: Control/Palliative  CURRENT # OF CHEMOTHERAPY CYCLES: 2 CURRENT ANTIEMETICS: Zofran, dexamethasone, Compazine  CURRENT SMOKING STATUS: Former smoker, quit 12/05/1997  ORAL CHEMOTHERAPY AND CONSENT: n/a  CURRENT BISPHOSPHONATES USE: None  PAIN MANAGEMENT: no cancer related pain, patient does have RA  NARCOTICS INDUCED CONSTIPATION: none  LIVING WILL AND CODE STATUS: ?   INTERVAL HISTORY: Megan [name] 73 y.o. female returns to the clinic today for followup visit. She presents unaccompanied. Overall she tolerated her first cycle of immunotherapy with  nivolumab with the exception of chills. She had no documented fever. She denied skin rash, diarrhea or changes in her baseline shortness of breath. She continues to find neuropathy in both her toes and fingertips. It is more predominant in her toes, affecting her balance. She does occasionally trip but has had no falls. Regarding her fingertips her thumbs are affected the  greatest. She complains of chronic cough which is worse after eating. She feels food may be "going down the wrong way". She continue to have some difficulty with her esophagus and is followed by gastroenterology. She tells her that she is to see a new gastroenterologist, Dr. Deatra Ina, in October. Patient requests a flu vaccine today.  She complains of leg weakness. She continues with physical therapy as ordered by Dr. Chase Caller. She voices no other specific complaints today. The patient denied having any significant weight loss or night sweats. She has no current nausea or vomiting. She denied having any significant chest pain but continues to have shortness breath with exertion with  cough but no hemoptysis.   MEDICAL HISTORY: Past Medical History  Diagnosis Date  . Hypertension   . Diabetes mellitus   . Rheumatoid arthritis   . GERD (gastroesophageal reflux disease)     no meds for  . Hx of radiation therapy 12/16/13-01/30/14    lung 66Gy  . Cancer     LUNG    ALLERGIES:  is allergic to remicade and carboplatin.  MEDICATIONS:  Current Outpatient Prescriptions  Medication Sig Dispense Refill  . Biotin 5000 MCG CAPS Take by mouth.      . Fluticasone-Salmeterol (ADVAIR DISKUS) 100-50 MCG/DOSE AEPB Inhale 1 puff into the lungs 2 (two) times daily.  60 each  6  . losartan-hydrochlorothiazide (HYZAAR) 100-25 MG per tablet Take 1 tablet by mouth daily.      . meloxicam (MOBIC) 15 MG tablet Take 15 mg by mouth daily.      . pantoprazole (PROTONIX) 40 MG tablet Take 1 tablet (40 mg total) by mouth daily.  90 tablet  1  .  POTASSIUM CHLORIDE PO Take by mouth.      . prochlorperazine (COMPAZINE) 10 MG tablet TAKE 1 TABLET BY MOUTH EVERY 6 HOURS AS NEEDED FOR NAUSEA OR VOMITING  60 tablet  0  . sucralfate (CARAFATE) 1 GM/10ML suspension Take 10 mL by mouth 4 times daily before meals.  240 mL  0  . vitamin B-12 (CYANOCOBALAMIN) 100 MCG tablet Take 100 mcg by mouth daily.      [name] co-enzyme Q-10 50 MG  capsule Take 50 mg by mouth daily.      . magnesium oxide (MAG-OX) 400 MG tablet Take 400 mg by mouth daily.       Current Facility-Administered Medications  Medication Dose Route Frequency Provider Last Rate Last Dose  . Influenza vac split quadrivalent PF (FLUARIX) injection 0.5 mL  0.5 mL Intramuscular Once Roshaunda Starkey E Aayat Hajjar, PA-C       Facility-Administered Medications Ordered in Other Visits  Medication Dose Route Frequency Provider Last Rate Last Dose  . sodium chloride 0.9 % injection 10 mL  10 mL Intracatheter PRN Curt Bears, MD        SURGICAL HISTORY:  Past Surgical History  Procedure Laterality Date  . Cholecystectomy    . Abdominal hysterectomy    . Dilation and curettage of uterus    . Abdominal surgery      REVIEW OF SYSTEMS:  Constitutional: positive for chills and no documented fever Eyes: negative Ears, nose, mouth, throat, and face: negative Respiratory: positive for cough and dyspnea on exertion Cardiovascular: negative Gastrointestinal: positive for dyspepsia and odynophagia Genitourinary:negative Integument/breast: negative Hematologic/lymphatic: negative Musculoskeletal:positive for muscle weakness Neurological: negative Behavioral/Psych: negative Endocrine: negative Allergic/Immunologic: negative   PHYSICAL EXAMINATION: General appearance: alert, cooperative and no distress Head: Normocephalic, without obvious abnormality, atraumatic Neck: no adenopathy, no JVD, supple, symmetrical, trachea midline and thyroid not enlarged, symmetric, no tenderness/mass/nodules Lymph nodes: Cervical, supraclavicular, and axillary nodes normal. Resp: clear to auscultation bilaterally Back: symmetric, no curvature. ROM normal. No CVA tenderness. Cardio: regular rate and rhythm, S1, S2 normal, no murmur, click, rub or gallop GI: soft, non-tender; bowel sounds normal; no masses,  no organomegaly Extremities: extremities normal, atraumatic, no cyanosis or  edema Neurologic: Alert and oriented X 3, normal strength and tone. Normal symmetric reflexes. Normal coordination and gait  ECOG PERFORMANCE STATUS: 1 - Symptomatic but completely ambulatory  Blood pressure 116/68, pulse 109, temperature 98.2 F (36.8 C), temperature source Oral, resp. rate 18, height 5\' 6"  (1.676 m), weight 151 lb (68.493 kg), SpO2 96.00%.  LABORATORY DATA: Lab Results  Component Value Date   WBC 6.8 07/14/2014   HGB 12.3 07/14/2014   HCT 37.8 07/14/2014   MCV 94.6 07/14/2014   PLT 206 07/14/2014      Chemistry      Component Value Date/Time   NA 139 07/14/2014 0929   NA 136 06/09/2014 0928   K 3.6 07/14/2014 0929   K 3.3* 06/09/2014 0928   CL 101 06/09/2014 0928   CO2 21* 07/14/2014 0929   CO2 22 06/09/2014 0928   BUN 10.1 07/14/2014 0929   BUN 13 06/09/2014 0928   CREATININE 0.8 07/14/2014 0929   CREATININE 0.87 06/09/2014 0928      Component Value Date/Time   CALCIUM 9.8 07/14/2014 0929   CALCIUM 8.9 06/09/2014 0928   ALKPHOS 92 07/14/2014 0929   ALKPHOS 106 06/09/2014 0928   AST 13 07/14/2014 0929   AST 16 06/09/2014 0928   ALT 12 07/14/2014 0929   ALT 21 06/09/2014  2703   BILITOT 0.73 07/14/2014 0929   BILITOT 0.5 06/09/2014 5009       RADIOGRAPHIC STUDIES: Ct Chest W Contrast  06/13/2014   CLINICAL DATA:  Re- states lung cancer.  EXAM: CT CHEST WITH CONTRAST  TECHNIQUE: Multidetector CT imaging of the chest was performed during intravenous contrast administration.  CONTRAST:  72mL OMNIPAQUE IOHEXOL 300 MG/ML  SOLN  COMPARISON:  CT chest 02/28/2014  FINDINGS: Re- demonstrated 2 cm heterogeneous left thyroid mass. Normal heart size. No pericardial effusion. Small hiatal hernia. Calcified mediastinal lymph nodes.  Interval development of right middle lobe and partial right upper lobe collapse which largely obscures the expected location of the previously described right upper lobe mass making measurement and comparison with prior difficult. This is likely secondary to a  new 1.6 cm right hilar soft tissue mass. There is central lower attenuation within the region of atelectasis which measures 3.8 x 2.1 cm, favored to represent the previously described mass which is somewhat increased in size when compared to prior were it measured 2.9 x 2.7 cm.  There is abrupt cut off of the right middle lobe bronchus. Interval development of a 1.4 cm subpleural nodular opacity within the right lower lobe (image 29). Additionally there has been interval development of multiple mixed solid and ground-glass opacities within the right lower lobe measuring 2.4 cm, 1.8 cm and 1.4 cm respectively. Unchanged calcified nodule within the peripheral aspect of the left lower lobe.  Imaging of the upper abdomen demonstrates unchanged low-attenuation hepatic lesions measuring 1.6 cm and 0.9 cm, favored to represent cysts. Status post cholecystectomy. Normal adrenal glands. Calcified granulomas in the spleen.  Mid thoracic spine degenerative change. No aggressive or acute appearing osseous lesions.  IMPRESSION: 1. Interval development of right middle lobe and partial right upper lobe atelectasis likely secondary to new right hilar soft tissue mass. The atelectasis largely obscures the previously described right upper lobe mass however the mass in the right upper lobe does appear to have increased in size when compared to prior examination. Additionally there is new abrupt cut off of the right middle lobe bronchus resulting in the above described right middle lobe atelectasis. Overall findings are concerning for disease progression. 2. Interval development of subpleural soft tissue nodule within the right lower lobe concerning for additional site of disease. An additional consideration given the location and appearance would be pulmonary infarct although this is felt to be less likely. 3. Interval development of multiple large solid and ground-glass opacities within the aerated right lower lobe. These may  potentially be infectious, inflammatory or post treatment related however metastatic disease is of concern.   Electronically Signed   By: Lovey Newcomer M.D.   On: 06/13/2014 11:14    ASSESSMENT AND PLAN: This is a very pleasant 73 years old white female with history of stage IIIB non-small cell lung cancer status post a course of concurrent chemoradiation with weekly carboplatin and paclitaxel status post 7 cycles. The patient tolerated her treatment fairly well with no significant adverse effects except for the radiation induced esophagitis. She received 3 cycles of consolidation chemotherapy with carboplatin and paclitaxel however carboplatin was discontinued with cycle #2 secondary to hypersensitivity reaction. Her recent restaging CT scan of the chest reveals evidence for disease progression. She's now being treated with immunotherapy with nivolumab and 3 mg per kilogram given every 2 weeks, status post 1 cycle. The patient was discussed with and also seen by Dr. Julien Nordmann. Overall she tolerated her first cycle  of immunotherapy without difficulty. She will proceed with cycle #2 today as scheduled. She will receive her flu vaccine today as requested. She'll followup in 2 weeks prior to cycle #3. Will plan to do a restaging CT scan after she completes 4 cycles. She is encouraged to followup with gastroenterology as scheduled. She was advised to call immediately if she has any concerning symptoms in the interval. The patient voices understanding of current disease status and treatment options and is in agreement with the current care plan.  All questions were answered. The patient knows to call the clinic with any problems, questions or concerns. We can certainly see the patient much sooner if necessary.  Disclaimer: This note was dictated with voice recognition software. Similar sounding words can inadvertently be transcribed and may not be corrected upon review. Carlton Adam,  PA-C 07/14/2014   ADDENDUM: Hematology/Oncology Attending: I had a face to face encounter with the patient today. I recommended her care plan. This is a very pleasant 73 years old white female with metastatic non-small cell lung cancer, squamous cell carcinoma. She is currently undergoing immunotherapy with Nivolumab status post 1 cycle and tolerated her first cycle fairly well with no significant adverse effects except for persistent fatigue and peripheral neuropathy from the previous chemotherapy. I recommended for her to proceed with cycle #2 today as scheduled. She would come back for followup visit in 2 weeks with the next cycle of her treatment. She was advised to call immediately if she has any concerning symptoms in the interval.  Disclaimer: This note was dictated with voice recognition software. Similar sounding words can inadvertently be transcribed and may be missed upon review. Eilleen Kempf., MD 07/14/2014

## 2014-07-14 NOTE — Progress Notes (Signed)
Per Ilda Mori it is okay to give flue vaccine today.

## 2014-07-15 ENCOUNTER — Encounter (HOSPITAL_COMMUNITY)
Admission: RE | Admit: 2014-07-15 | Discharge: 2014-07-15 | Disposition: A | Discharge status: Home / Self Care | Payer: Medicare Other | Source: Ambulatory Visit | Attending: Internal Medicine | Admitting: Internal Medicine

## 2014-07-15 ENCOUNTER — Telehealth: Payer: Self-pay | Admitting: Internal Medicine

## 2014-07-15 ENCOUNTER — Telehealth: Payer: Self-pay | Admitting: Medical Oncology

## 2014-07-15 ENCOUNTER — Telehealth (HOSPITAL_COMMUNITY): Payer: Self-pay

## 2014-07-15 NOTE — Telephone Encounter (Signed)
lvm for pt regarding to 9.28 time change....mailed pt appt sched/avs and letter

## 2014-07-15 NOTE — Telephone Encounter (Signed)
Pt reported she had flu vaccine yesterday with Nivolimab and during Pulmonary rehab  visit later in day she had temp of 100.7 with chills , She saw her PCP who did CXR, antibiotic IM and "blood test". ( see EPIC note cardiac rehab). Note to [place].

## 2014-07-15 NOTE — Progress Notes (Signed)
Megan [name] presented to pulmonary rehab for her exercise session not feeling well. She stated she received her cancer treatment yesterday along with her flu vaccine. Megan [name] stated once she arrived home, she began to cough. She self administered cough medication with codeine last evening and went to bed. Today she looks unwell, complaining of feeling cold. HR 90, BP 100/60, O2 saturation 98% at rest on RA. No coughing noted at this time. Temp was elevated at 100.7 oral. Physical assessment only revealed rhonci to the right lower lung. No peripheral edema noted. RN notified primary care MD and appointment made for 12 noon today. Patient denied all other complaints and was released from pulmonary rehab appt with instructions to go directly to her primary care MD.

## 2014-07-17 ENCOUNTER — Encounter (HOSPITAL_COMMUNITY): Payer: Medicare Other

## 2014-07-18 ENCOUNTER — Ambulatory Visit (HOSPITAL_BASED_OUTPATIENT_CLINIC_OR_DEPARTMENT_OTHER): Payer: Medicare Other

## 2014-07-18 ENCOUNTER — Other Ambulatory Visit: Payer: Self-pay | Admitting: Nurse Practitioner

## 2014-07-18 ENCOUNTER — Encounter: Payer: Self-pay | Admitting: Nurse Practitioner

## 2014-07-18 ENCOUNTER — Telehealth: Payer: Self-pay | Admitting: *Deleted

## 2014-07-18 ENCOUNTER — Ambulatory Visit (HOSPITAL_BASED_OUTPATIENT_CLINIC_OR_DEPARTMENT_OTHER): Payer: Medicare Other | Admitting: Nurse Practitioner

## 2014-07-18 VITALS — BP 138/51 | HR 118 | Temp 98.4°F | Resp 18 | Ht 66.0 in | Wt 151.7 lb

## 2014-07-18 DIAGNOSIS — R509 Fever, unspecified: Secondary | ICD-10-CM

## 2014-07-18 DIAGNOSIS — G620 Drug-induced polyneuropathy: Secondary | ICD-10-CM

## 2014-07-18 DIAGNOSIS — I959 Hypotension, unspecified: Secondary | ICD-10-CM

## 2014-07-18 DIAGNOSIS — D696 Thrombocytopenia, unspecified: Secondary | ICD-10-CM

## 2014-07-18 DIAGNOSIS — E8809 Other disorders of plasma-protein metabolism, not elsewhere classified: Secondary | ICD-10-CM

## 2014-07-18 DIAGNOSIS — E860 Dehydration: Secondary | ICD-10-CM

## 2014-07-18 DIAGNOSIS — R530 Neoplastic (malignant) related fatigue: Secondary | ICD-10-CM

## 2014-07-18 DIAGNOSIS — C342 Malignant neoplasm of middle lobe, bronchus or lung: Secondary | ICD-10-CM

## 2014-07-18 DIAGNOSIS — C3490 Malignant neoplasm of unspecified part of unspecified bronchus or lung: Secondary | ICD-10-CM

## 2014-07-18 DIAGNOSIS — R252 Cramp and spasm: Secondary | ICD-10-CM | POA: Insufficient documentation

## 2014-07-18 DIAGNOSIS — T451X5A Adverse effect of antineoplastic and immunosuppressive drugs, initial encounter: Secondary | ICD-10-CM

## 2014-07-18 DIAGNOSIS — R5383 Other fatigue: Secondary | ICD-10-CM | POA: Insufficient documentation

## 2014-07-18 LAB — URINALYSIS, MICROSCOPIC - CHCC
BLOOD: NEGATIVE
Bilirubin (Urine): NEGATIVE
Glucose: NEGATIVE mg/dL
Ketones: NEGATIVE mg/dL
Leukocyte Esterase: NEGATIVE
Nitrite: NEGATIVE
Protein: 30 mg/dL
RBC / HPF: NEGATIVE (ref 0–2)
SPECIFIC GRAVITY, URINE: 1.015 (ref 1.003–1.035)
Urobilinogen, UR: 0.2 mg/dL (ref 0.2–1)
pH: 6 (ref 4.6–8.0)

## 2014-07-18 LAB — CBC WITH DIFFERENTIAL/PLATELET
BASO%: 0.4 % (ref 0.0–2.0)
Basophils Absolute: 0 10*3/uL (ref 0.0–0.1)
EOS%: 10.1 % — AB (ref 0.0–7.0)
Eosinophils Absolute: 0.5 10*3/uL (ref 0.0–0.5)
HEMATOCRIT: 35.1 % (ref 34.8–46.6)
HGB: 11.5 g/dL — ABNORMAL LOW (ref 11.6–15.9)
LYMPH%: 10.4 % — ABNORMAL LOW (ref 14.0–49.7)
MCH: 30.1 pg (ref 25.1–34.0)
MCHC: 32.9 g/dL (ref 31.5–36.0)
MCV: 91.4 fL (ref 79.5–101.0)
MONO#: 0.4 10*3/uL (ref 0.1–0.9)
MONO%: 6.8 % (ref 0.0–14.0)
NEUT#: 3.8 10*3/uL (ref 1.5–6.5)
NEUT%: 72.3 % (ref 38.4–76.8)
Platelets: 136 10*3/uL — ABNORMAL LOW (ref 145–400)
RBC: 3.84 10*6/uL (ref 3.70–5.45)
RDW: 15.7 % — ABNORMAL HIGH (ref 11.2–14.5)
WBC: 5.2 10*3/uL (ref 3.9–10.3)
lymph#: 0.5 10*3/uL — ABNORMAL LOW (ref 0.9–3.3)

## 2014-07-18 LAB — COMPREHENSIVE METABOLIC PANEL (CC13)
ALK PHOS: 99 U/L (ref 40–150)
ALT: 20 U/L (ref 0–55)
AST: 26 U/L (ref 5–34)
Albumin: 3.1 g/dL — ABNORMAL LOW (ref 3.5–5.0)
Anion Gap: 11 mEq/L (ref 3–11)
BUN: 13.3 mg/dL (ref 7.0–26.0)
CO2: 25 mEq/L (ref 22–29)
CREATININE: 1 mg/dL (ref 0.6–1.1)
Calcium: 9.1 mg/dL (ref 8.4–10.4)
Chloride: 95 mEq/L — ABNORMAL LOW (ref 98–109)
Glucose: 161 mg/dl — ABNORMAL HIGH (ref 70–140)
Potassium: 4 mEq/L (ref 3.5–5.1)
Sodium: 131 mEq/L — ABNORMAL LOW (ref 136–145)
Total Bilirubin: 1.01 mg/dL (ref 0.20–1.20)
Total Protein: 6.8 g/dL (ref 6.4–8.3)

## 2014-07-18 MED ORDER — ONDANSETRON 8 MG/50ML IVPB (CHCC)
8.0000 mg | Freq: Once | INTRAVENOUS | Status: AC
  Administered 2014-07-18: 8 mg via INTRAVENOUS

## 2014-07-18 MED ORDER — SODIUM CHLORIDE 0.9 % IV SOLN
Freq: Once | INTRAVENOUS | Status: AC
  Administered 2014-07-18: 13:00:00 via INTRAVENOUS

## 2014-07-18 MED ORDER — ONDANSETRON 8 MG/NS 50 ML IVPB
INTRAVENOUS | Status: AC
  Filled 2014-07-18: qty 8

## 2014-07-18 NOTE — Patient Instructions (Signed)

## 2014-07-18 NOTE — Assessment & Plan Note (Signed)
Patient is complaining of worsening fatigue within this past few weeks.  This may be related to her immunotherapy treatment; or infection.  Patient was encouraged to remain as active as possible.

## 2014-07-18 NOTE — Progress Notes (Signed)
[place]   Chief Complaint  Patient presents with  . Fever    HPI: Megan [name] 73 y.o. female diagnosed with lung cancer.  Currently undergoing Nivolumab  Immunotherapy.  Patient called the cancer Center today requesting urgent care visit.  She reports having an intermittent fever to maximum of 100.6 for the past 4-5 days.  She states that she went her per rectum provider on Wednesday, 07/16/2014 with the same complaints.  Patient had labs drawn; and also obtained a chest x-ray at her permit her provider's office.  Patient reports that her permit her provider noted she had no pneumonia per the chest x-ray.  Patient was initiated on Ceftin antibiotics at that time prophylactically.  Patient is complaining of worsening fatigue; but feels her appetite is stable.  She feels slightly dehydrated his requested IV fluid rehydration.  She states that her chronic bilateral lower leg cramping is managed by taking quinine on an intermittent basis.  She states the neuropathy to all of her extremities is stable at present.    She denies any UTI symptoms whatsoever.  She does complain of some chronic very mild shortness of breath; and occasionally has a dry nonproductive cough.  She states that she does take Tussionex cough syrup on occasion for her cough.  She denies any specific nausea, vomiting, or diarrhea recently.  Fever    .  Review of Systems  Constitutional: Positive for fever.    Past Medical History  Diagnosis Date  . Hypertension   . Diabetes mellitus   . Rheumatoid arthritis   . GERD (gastroesophageal reflux disease)     no meds for  . Hx of radiation therapy 12/16/13-01/30/14    lung 66Gy  . Cancer     LUNG    Past Surgical History  Procedure Laterality Date  . Cholecystectomy    . Abdominal hysterectomy    . Dilation and curettage of uterus    . Abdominal surgery      has Lung mass; Need for prophylactic vaccination and inoculation against  influenza; Squamous cell lung cancer; Cancer of middle lobe of lung; Right-sided chest wall pain; Radiation esophagitis; Dyspnea; Fatigue; Neuropathy due to chemotherapeutic drug; Fever; Dehydration; Leg cramps; Hypoalbuminemia; Thrombocytopenia, unspecified; and Hypotension on her problem list.     is allergic to remicade and carboplatin.    Medication List       This list is accurate as of: 07/18/14  6:41 PM.  Always use your most recent med list.               Biotin 5000 MCG Caps  Take by mouth.     cefUROXime 500 MG tablet  Commonly known as:  CEFTIN  Take 500 mg by mouth 2 (two) times daily with a meal.     co-enzyme Q-10 50 MG capsule  Take 50 mg by mouth daily.     Fluticasone-Salmeterol 100-50 MCG/DOSE Aepb  Commonly known as:  ADVAIR DISKUS  Inhale 1 puff into the lungs 2 (two) times daily.     losartan-hydrochlorothiazide 100-25 MG per tablet  Commonly known as:  HYZAAR  Take 1 tablet by mouth daily.     magnesium oxide 400 MG tablet  Commonly known as:  MAG-OX  Take 400 mg by mouth daily.     meloxicam 15 MG tablet  Commonly known as:  MOBIC  Take 15 mg by mouth daily.     pantoprazole 40 MG tablet  Commonly known as:  PROTONIX  Take 1 tablet (40 mg total) by mouth daily.     POTASSIUM CHLORIDE PO  Take by mouth.     prochlorperazine 10 MG tablet  Commonly known as:  COMPAZINE  TAKE 1 TABLET BY MOUTH EVERY 6 HOURS AS NEEDED FOR NAUSEA OR VOMITING     sucralfate 1 GM/10ML suspension  Commonly known as:  CARAFATE  Take 10 mL by mouth 4 times daily before meals.     vitamin B-12 100 MCG tablet  Commonly known as:  CYANOCOBALAMIN  Take 100 mcg by mouth daily.         PHYSICAL EXAMINATION  Blood pressure 138/51, pulse 118, temperature 98.4 F (36.9 C), temperature source Oral, resp. rate 18, height 5\' 6"  (1.676 m), weight 151 lb 11.2 oz (68.811 kg), SpO2 96.00%.  Physical Exam  Nursing note and vitals reviewed. Constitutional: She is oriented  to person, place, and time. Vital signs are normal. She appears dehydrated. She appears unhealthy. No distress.  HENT:  Head: Normocephalic and atraumatic.  Mouth/Throat: Oropharynx is clear and moist. No oropharyngeal exudate.  Eyes: Conjunctivae and EOM are normal. Pupils are equal, round, and reactive to light. No scleral icterus.  Neck: Normal range of motion. Neck supple. No JVD present. No thyromegaly present.  Cardiovascular: Normal rate, regular rhythm and intact distal pulses.  Exam reveals no friction rub.   No murmur heard. Pulmonary/Chest: Effort normal and breath sounds normal. No respiratory distress. She has no wheezes. She has no rales.  Occasional dry cough on exam only.  No acute respiratory distress.  Abdominal: Soft. Bowel sounds are normal. She exhibits no distension and no mass. There is no tenderness. There is no rebound.  Musculoskeletal: Normal range of motion. She exhibits no edema and no tenderness.  Lymphadenopathy:    She has no cervical adenopathy.  Neurological: She is alert and oriented to person, place, and time. Gait normal.  Skin: Skin is warm and dry. No rash noted. No erythema.  Psychiatric: Affect normal.    LABORATORY DATA:. CBC  Lab Results  Component Value Date   WBC 5.2 07/18/2014   RBC 3.84 07/18/2014   HGB 11.5* 07/18/2014   HCT 35.1 07/18/2014   PLT 136* 07/18/2014   MCV 91.4 07/18/2014   MCH 30.1 07/18/2014   MCHC 32.9 07/18/2014   RDW 15.7* 07/18/2014   LYMPHSABS 0.5* 07/18/2014   MONOABS 0.4 07/18/2014   EOSABS 0.5 07/18/2014   BASOSABS 0.0 07/18/2014     CMET  Lab Results  Component Value Date   NA 131* 07/18/2014   K 4.0 07/18/2014   CL 101 06/09/2014   CO2 25 07/18/2014   GLUCOSE 161* 07/18/2014   BUN 13.3 07/18/2014   CREATININE 1.0 07/18/2014   CALCIUM 9.1 07/18/2014   PROT 6.8 07/18/2014   ALBUMIN 3.1* 07/18/2014   AST 26 07/18/2014   ALT 20 07/18/2014   ALKPHOS 99 07/18/2014   BILITOT 1.01 07/18/2014     Ref Range 11:20  AM Glucose Negative mg/dL  Negative   Bilirubin (Urine) Negative  Negative   Ketones Negative mg/dL  Negative   Specific Gravity, Urine 1.003 - 1.035  1.015   Blood Negative  Negative   pH 4.6 - 8.0  6.0   Protein Negative- <30 mg/dL  30   Urobilinogen, UR 0.2 - 1 mg/dL  0.2   Nitrite Negative  Negative   Leukocyte Esterase Negative  Negative   RBC / HPF 0 - 2  Negative  WBC, UA 0 - 2  0-2   Bacteria, UA Negative- Trace  Trace   Epithelial Cells Negative- Few  Moderate   Mucus, UA Negative- Small  Moderate   Resulting Agency [facility]   ASSESSMENT/PLAN:    Squamous cell lung cancer  Assessment & Plan Patient received last cycle of Nivolumab : 07/14/2014.  She is scheduled for cycle 3 of the same therapy on 07/28/2014.  She will need to be scheduled for restaging scans what she has completed her fourth cycle of the Nivolumab.    Fatigue  Assessment & Plan Patient is complaining of worsening fatigue within this past few weeks.  This may be related to her immunotherapy treatment; or infection.  Patient was encouraged to remain as active as possible.   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   Fever  Assessment & Plan Patient reports temperature to maximum 100.6 earlier today.  Temperature within normal range while at the cancer Center today.  Urinalysis was negative.  White count is normal.  Pending blood culture and urine culture results.  Chest x-ray obtained per primary care provider 2 days ago revealed no pneumonia. Confirmed the patient was prescribed Ceftin antibiotic for her permit her provider just 2 days ago.  Advised patient to continue with Ceftin antibiotic as previously directed.  Also advised patient and her husband to call or come directed to the emergency department over the weekend if she develops any worsening symptoms  whatsoever.   Dehydration  Assessment & Plan Patient did appear dehydrated today; and received 1 L normal saline IV fluid rehydration today.  She did appear to feel much better following her IV fluids today.   Leg cramps  Assessment & Plan Patient suffers with chronic bilateral leg cramps; and takes quinine on an as-needed basis.  This is most likely a result of previous chemotherapy treatment.  Patient feels that these leg cramps are stable at present.  She denies any specific calf pain whatsoever.   Hypoalbuminemia  Assessment & Plan Patient's albumin has decreased; and patient was encouraged to push protein in her diet is much as possible.   Thrombocytopenia, unspecified  Assessment & Plan Count has dropped from 206 down to 136.  Patient denies any worsening issues with either easy bleeding or bruising.  Will continue to monitor.   Hypotension  Assessment & Plan Patient's  systolic blood pressure did drop down to 85 of following per IV fluid rehydration this afternoon.  Patient denied any new symptoms that could be related to hypertension whatsoever.  Patient states she felt much better following her IV fluid rehydration.  Patient did report that her permit her provider just discontinued all of her blood pressure medication 2 days ago due to hypertension issues.  Patient was advised to continue holding all antihypertensive medications.  Also, advised patient to call or go directed to the emergency department if she develops any new or worrisome symptoms whatsoever.  Advised both patient and her husband to meet you check her blood pressure at home on a regular basis; and follow up with her permit her provider for any other medication changes that may affect her blood pressure.     Patient stated understanding of all instructions; and was in agreement with this plan of care. The patient knows to call the clinic with any problems, questions or concerns.   Review/collaboration with  Dr.  Julien Nordmann regarding all aspects  of patient's visit today.   Total time spent with patient was  40 minutes;  with greater than  75 percent of that time spent in face to face counseling regarding her symptoms, extensive review of treatment plan, frequent monitoring of patient in the infusion area,  and coordination of care and follow up.  Disclaimer: This note was dictated with voice recognition software. Similar sounding words can inadvertently be transcribed and may not be corrected upon review.   Drue Second, NP 07/18/2014

## 2014-07-18 NOTE — Assessment & Plan Note (Signed)
Patient's  systolic blood pressure did drop down to 85 of following per IV fluid rehydration this afternoon.  Patient denied any new symptoms that could be related to hypertension whatsoever.  Patient states she felt much better following her IV fluid rehydration.  Patient did report that her permit her provider just discontinued all of her blood pressure medication 2 days ago due to hypertension issues.  Patient was advised to continue holding all antihypertensive medications.  Also, advised patient to call or go directed to the emergency department if she develops any new or worrisome symptoms whatsoever.  Advised both patient and her husband to meet you check her blood pressure at home on a regular basis; and follow up with her permit her provider for any other medication changes that may affect her blood pressure.

## 2014-07-18 NOTE — Assessment & Plan Note (Signed)
Count has dropped from 206 down to 136.  Patient denies any worsening issues with either easy bleeding or bruising.  Will continue to monitor.

## 2014-07-18 NOTE — Assessment & Plan Note (Signed)
Patient did appear dehydrated today; and received 1 L normal saline IV fluid rehydration today.  She did appear to feel much better following her IV fluids today.

## 2014-07-18 NOTE — Assessment & Plan Note (Signed)
Chemotherapy-induced neuropathy is chronic for patient; and patient states is stable at present.

## 2014-07-18 NOTE — Telephone Encounter (Signed)
   Provider input needed:  Need further instructions for symptoms management   Reason for call:   Temp of  100.6   -  Did not know how long pt has had the temp - could not work thermometer.   ALLERGIES:  is allergic to remicade and carboplatin.  Patient last received chemotherapy/ treatment on :  07/14/14    Nivolumab and  Flu shot.  Patient was last seen in the office on :  07/14/14 by Burnetta Sabin, [place].  Next appt is :  07/28/14  With  Erasmo Downer, NP  Is patient having fevers greater than 100.5?   YES  Is patient having uncontrolled pain, or new pain?  YES .  Stated had bilateral back legs cramping - right more than left.  Spoke with her primary  2 days ago  about this issue.  Is patient having new back pain that changes with position (worsens or eases when laying down?)  NO.  Is patient able to eat and drink? YES   Is patient able to pass stool without difficulty?   YES.    Is patient having uncontrolled nausea? NO  Patient  Called  07/18/2014 with complaint of   Increased temp 100.6 ( not sure how many days it has been going on ).  Spoke with primary 2 days ago. Had chest xray with no pneumonia.  Had shaking chills for a few days - primary md aware.  Had coughing which pt takes Codeine with relief.   Need to know what to do.  Summary Based on the above information advised  Pt  That  Cindee, NP for symptoms management will be notified for further instructions.  Pt's  Phone   [phone].   Elray Buba Le  07/18/2014, 8:56 AM   Background Info  Megan [name]   DOB: 04-29-1941   MR#: [id number]   CSN#   [id number] 07/18/2014

## 2014-07-18 NOTE — Assessment & Plan Note (Signed)
Patient suffers with chronic bilateral leg cramps; and takes quinine on an as-needed basis.  This is most likely a result of previous chemotherapy treatment.  Patient feels that these leg cramps are stable at present.  She denies any specific calf pain whatsoever.

## 2014-07-18 NOTE — Telephone Encounter (Signed)
Spoke with pt and gave pt appts time for today to see Cindee, NP for symptoms management.  Pt voiced understanding.

## 2014-07-18 NOTE — Assessment & Plan Note (Signed)
Patient's albumin has decreased; and patient was encouraged to push protein in her diet is much as possible.

## 2014-07-18 NOTE — Assessment & Plan Note (Signed)
Patient reports temperature to maximum 100.6 earlier today.  Temperature within normal range while at the cancer Center today.  Urinalysis was negative.  White count is normal.  Pending blood culture and urine culture results.  Chest x-ray obtained per primary care provider 2 days ago revealed no pneumonia. Confirmed the patient was prescribed Ceftin antibiotic for her permit her provider just 2 days ago.  Advised patient to continue with Ceftin antibiotic as previously directed.  Also advised patient and her husband to call or come directed to the emergency department over the weekend if she develops any worsening symptoms whatsoever.

## 2014-07-18 NOTE — Progress Notes (Signed)
Low BP reviewed with Selena Lesser, NP.  NP evaluated pt.  Ok to discharge.  Instructed patient to drink plenty of fluid and return for IV fluids if feeling lightheaded/dizzy.  Pt verbalized understanding.

## 2014-07-18 NOTE — Assessment & Plan Note (Signed)
Patient received last cycle of Nivolumab : 07/14/2014.  She is scheduled for cycle 3 of the same therapy on 07/28/2014.  She will need to be scheduled for restaging scans what she has completed her fourth cycle of the Nivolumab.

## 2014-07-19 LAB — URINE CULTURE

## 2014-07-21 ENCOUNTER — Telehealth: Payer: Self-pay

## 2014-07-21 NOTE — Telephone Encounter (Signed)
Pt came to cancer center, filled out triage form. Spoke with patient, states she still is not feeling well, states the last time she had fluids she felt so much better but this time she doesn't so is questioning if she received the correct fluids.. Informed pt that she received the same fluids as she did this time but denies drinking and extra fluids this weekend. Pt states she is very weak and is wondering if she is just having a reaction to flu vaccine, also states she had a fever, leg cramps and cough. Informed pt that [place] schedule is full today but she can see the symptom management NP Drue Second, pt requested VS to be done and she would see how they were before making up her mind. PT BP was 98/80, HR 112, TEMP 97.8. Pt states she was satisfied with VS at this time and she would just call if any more symptoms arised or persisted. Informed her she had a follow up with Mikey Bussing on 9/28 and to drink plenty of fluids, and to call with any fever, or any other issues. Pt verbalized understanding and denies any further questions or concerns.

## 2014-07-21 NOTE — Telephone Encounter (Signed)
Pt called and left message for Awilda Metro, PA stating she will be here at noon to see Ad[facility] and she would like her to review her chart from Friday, states she believes her treatment on Friday for dehydration wasn't correct and she still doesn't feel good.  Informed Awilda Metro, PA regarding pts request, [facility] schedule completely booked today along with Dr. Julien Nordmann. Called patient back and left message that patient could come fill out triage paperwork at the front desk and could see Drue Second, NP if needed.

## 2014-07-22 ENCOUNTER — Encounter (HOSPITAL_COMMUNITY): Payer: Medicare Other

## 2014-07-22 ENCOUNTER — Emergency Department (HOSPITAL_COMMUNITY)
Admission: EM | Admit: 2014-07-22 | Discharge: 2014-07-22 | Disposition: A | Discharge status: Home / Self Care | Payer: Medicare Other | Attending: Emergency Medicine | Admitting: Emergency Medicine

## 2014-07-22 ENCOUNTER — Encounter (HOSPITAL_COMMUNITY): Payer: Self-pay | Admitting: Emergency Medicine

## 2014-07-22 ENCOUNTER — Emergency Department (HOSPITAL_COMMUNITY): Payer: Medicare Other

## 2014-07-22 ENCOUNTER — Telehealth (HOSPITAL_COMMUNITY): Payer: Self-pay

## 2014-07-22 DIAGNOSIS — Z79899 Other long term (current) drug therapy: Secondary | ICD-10-CM | POA: Diagnosis not present

## 2014-07-22 DIAGNOSIS — M069 Rheumatoid arthritis, unspecified: Secondary | ICD-10-CM | POA: Diagnosis not present

## 2014-07-22 DIAGNOSIS — C3490 Malignant neoplasm of unspecified part of unspecified bronchus or lung: Secondary | ICD-10-CM | POA: Insufficient documentation

## 2014-07-22 DIAGNOSIS — R509 Fever, unspecified: Secondary | ICD-10-CM | POA: Insufficient documentation

## 2014-07-22 DIAGNOSIS — Z87891 Personal history of nicotine dependence: Secondary | ICD-10-CM | POA: Insufficient documentation

## 2014-07-22 DIAGNOSIS — M25579 Pain in unspecified ankle and joints of unspecified foot: Secondary | ICD-10-CM | POA: Insufficient documentation

## 2014-07-22 DIAGNOSIS — K219 Gastro-esophageal reflux disease without esophagitis: Secondary | ICD-10-CM | POA: Insufficient documentation

## 2014-07-22 DIAGNOSIS — M79605 Pain in left leg: Secondary | ICD-10-CM

## 2014-07-22 DIAGNOSIS — I10 Essential (primary) hypertension: Secondary | ICD-10-CM | POA: Insufficient documentation

## 2014-07-22 DIAGNOSIS — E119 Type 2 diabetes mellitus without complications: Secondary | ICD-10-CM | POA: Insufficient documentation

## 2014-07-22 DIAGNOSIS — IMO0002 Reserved for concepts with insufficient information to code with codable children: Secondary | ICD-10-CM | POA: Insufficient documentation

## 2014-07-22 DIAGNOSIS — Z791 Long term (current) use of non-steroidal anti-inflammatories (NSAID): Secondary | ICD-10-CM | POA: Diagnosis not present

## 2014-07-22 DIAGNOSIS — M79604 Pain in right leg: Secondary | ICD-10-CM

## 2014-07-22 LAB — COMPREHENSIVE METABOLIC PANEL
ALT: 24 U/L (ref 0–35)
AST: 19 U/L (ref 0–37)
Albumin: 2.9 g/dL — ABNORMAL LOW (ref 3.5–5.2)
Alkaline Phosphatase: 100 U/L (ref 39–117)
Anion gap: 13 (ref 5–15)
BUN: 10 mg/dL (ref 6–23)
CALCIUM: 8.7 mg/dL (ref 8.4–10.5)
CO2: 20 mEq/L (ref 19–32)
CREATININE: 0.67 mg/dL (ref 0.50–1.10)
Chloride: 103 mEq/L (ref 96–112)
GFR calc Af Amer: >90 mL/min (ref >90)
GFR, EST NON AFRICAN AMERICAN: 86 mL/min — AB (ref >90)
Glucose, Bld: 152 mg/dL — ABNORMAL HIGH (ref 70–99)
Potassium: 4.4 mEq/L (ref 3.7–5.3)
Sodium: 136 mEq/L — ABNORMAL LOW (ref 137–147)
TOTAL PROTEIN: 6.1 g/dL (ref 6.0–8.3)
Total Bilirubin: 0.5 mg/dL (ref 0.3–1.2)

## 2014-07-22 LAB — URINE MICROSCOPIC-ADD ON

## 2014-07-22 LAB — URINALYSIS, ROUTINE W REFLEX MICROSCOPIC
Bilirubin Urine: NEGATIVE
Glucose, UA: NEGATIVE mg/dL
Hgb urine dipstick: NEGATIVE
KETONES UR: NEGATIVE mg/dL
NITRITE: NEGATIVE
PH: 7 (ref 5.0–8.0)
Protein, ur: NEGATIVE mg/dL
Specific Gravity, Urine: 1.01 (ref 1.005–1.030)
Urobilinogen, UA: 0.2 mg/dL (ref 0.0–1.0)

## 2014-07-22 LAB — CBC
HEMATOCRIT: 31.2 % — AB (ref 36.0–46.0)
HEMOGLOBIN: 10.5 g/dL — AB (ref 12.0–15.0)
MCH: 30.2 pg (ref 26.0–34.0)
MCHC: 33.7 g/dL (ref 30.0–36.0)
MCV: 89.7 fL (ref 78.0–100.0)
Platelets: 184 10*3/uL (ref 150–400)
RBC: 3.48 MIL/uL — AB (ref 3.87–5.11)
RDW: 14.7 % (ref 11.5–15.5)
WBC: 4.2 10*3/uL (ref 4.0–10.5)

## 2014-07-22 LAB — I-STAT CG4 LACTIC ACID, ED: Lactic Acid, Venous: 1.31 mmol/L (ref 0.5–2.2)

## 2014-07-22 LAB — SEDIMENTATION RATE: SED RATE: 50 mm/h — AB (ref 0–22)

## 2014-07-22 MED ORDER — HYDROMORPHONE HCL 1 MG/ML IJ SOLN
1.0000 mg | Freq: Once | INTRAMUSCULAR | Status: AC
  Administered 2014-07-22: 1 mg via INTRAVENOUS
  Filled 2014-07-22: qty 1

## 2014-07-22 MED ORDER — MORPHINE SULFATE 4 MG/ML IJ SOLN
4.0000 mg | Freq: Once | INTRAMUSCULAR | Status: AC
  Administered 2014-07-22: 4 mg via INTRAVENOUS
  Filled 2014-07-22: qty 1

## 2014-07-22 MED ORDER — DOCUSATE SODIUM 100 MG PO CAPS: 100.0000 mg | ORAL_CAPSULE | Freq: Two times a day (BID) | ORAL | Status: DC

## 2014-07-22 MED ORDER — SODIUM CHLORIDE 0.9 % IV BOLUS (SEPSIS)
1000.0000 mL | Freq: Once | INTRAVENOUS | Status: AC
  Administered 2014-07-22: 1000 mL via INTRAVENOUS

## 2014-07-22 MED ORDER — OXYCODONE-ACETAMINOPHEN 5-325 MG PO TABS: 1.0000 | ORAL_TABLET | Freq: Four times a day (QID) | ORAL | Status: DC | PRN

## 2014-07-22 NOTE — ED Notes (Signed)
Bed: TK24 Expected date:  Expected time:  Means of arrival:  Comments: EMS- 73yo F, Joint pain, Hx of CA

## 2014-07-22 NOTE — Discharge Instructions (Signed)

## 2014-07-22 NOTE — ED Notes (Signed)
Pt in xray at present time. Will draw labs with pt return.

## 2014-07-22 NOTE — ED Notes (Signed)
Pt reports chronic pain that started 1 week ago- pt seen by [facility] 1 week ago x rays taken. Given RX pain meds. NO relief. Here for evaluation. Some inrovment last week. Now pain has increased. Generalized pain. Pt dneis any other complaints. EDP [place] assessed pt upon arrival. from [facility]. Flu shot and CHEMO Monday

## 2014-07-22 NOTE — ED Provider Notes (Signed)
CSN: [id number]     Arrival date & time 07/22/14  9811 History   First MD Initiated Contact with Patient 07/22/14 [phone]     No chief complaint on file.    (Consider location/radiation/quality/duration/timing/severity/associated sxs/prior Treatment) Patient is a 73 y.o. female presenting with general illness.  Illness Location:  Lower extremity joints Quality:  Pains Severity:  Moderate Onset quality:  Gradual Duration:  1 week Timing:  Constant Progression:  Worsening Chronicity:  New Context:  Stage 3 lung cancer, curently on immunotherapy, had PNA last month. Recieved NS in the [place] for dehydration last week Relieved by:  Nothing Worsened by:  Nothing Associated symptoms: fever (100.6 yesterday) and vomiting (dry-heaving)   Associated symptoms: no abdominal pain, no congestion, no cough, no diarrhea, no headaches, no loss of consciousness, no nausea, no rash, no rhinorrhea and no shortness of breath     Past Medical History  Diagnosis Date  . Hypertension   . Diabetes mellitus   . Rheumatoid arthritis   . GERD (gastroesophageal reflux disease)     no meds for  . Hx of radiation therapy 12/16/13-01/30/14    lung 66Gy  . Cancer     LUNG   Past Surgical History  Procedure Laterality Date  . Cholecystectomy    . Abdominal hysterectomy    . Dilation and curettage of uterus    . Abdominal surgery     Family History  Problem Relation Age of Onset  . Hypertension Other   . Stroke Other   . Heart attack Other   . Hemachromatosis Other   . Rheum arthritis      both sets of grandparents and father   History  Substance Use Topics  . Smoking status: Former Smoker -- 3.00 packs/day for 45 years    Types: Cigarettes    Quit date: 12/05/1997  . Smokeless tobacco: Never Used  . Alcohol Use: Yes     Comment: occasional   OB History   Grav Para Term Preterm Abortions TAB SAB Ect Mult Living                 Review of Systems  Constitutional: Positive for fever  (100.6 yesterday).  HENT: Negative for congestion and rhinorrhea.   Respiratory: Negative for cough and shortness of breath.   Gastrointestinal: Positive for vomiting (dry-heaving). Negative for nausea, abdominal pain and diarrhea.  Skin: Negative for rash.  Neurological: Negative for loss of consciousness and headaches.  All other systems reviewed and are negative.     Allergies  Remicade and Carboplatin  Home Medications   Prior to Admission medications   Medication Sig Start Date End Date Taking? Authorizing Provider  Biotin 5000 MCG CAPS Take by mouth.    Historical Provider, MD  cefUROXime (CEFTIN) 500 MG tablet Take 500 mg by mouth 2 (two) times daily with a meal.    Historical Provider, MD  co-enzyme Q-10 50 MG capsule Take 50 mg by mouth daily.    Historical Provider, MD  Fluticasone-Salmeterol (ADVAIR DISKUS) 100-50 MCG/DOSE AEPB Inhale 1 puff into the lungs 2 (two) times daily. 05/28/14   Brand Males, MD  losartan-hydrochlorothiazide (HYZAAR) 100-25 MG per tablet Take 1 tablet by mouth daily.    Historical Provider, MD  magnesium oxide (MAG-OX) 400 MG tablet Take 400 mg by mouth daily.    Historical Provider, MD  meloxicam (MOBIC) 15 MG tablet Take 15 mg by mouth daily.    Historical Provider, MD  pantoprazole (PROTONIX) 40 MG tablet  Take 1 tablet (40 mg total) by mouth daily. 05/26/14 05/26/15  Tammy S Parrett, NP  POTASSIUM CHLORIDE PO Take by mouth.    Historical Provider, MD  prochlorperazine (COMPAZINE) 10 MG tablet TAKE 1 TABLET BY MOUTH EVERY 6 HOURS AS NEEDED FOR NAUSEA OR VOMITING 05/07/14   Adrena E Johnson, PA-C  sucralfate (CARAFATE) 1 GM/10ML suspension Take 10 mL by mouth 4 times daily before meals. 04/11/14   Inda Castle, MD  vitamin B-12 (CYANOCOBALAMIN) 100 MCG tablet Take 100 mcg by mouth daily.    Historical Provider, MD   BP 139/83  Pulse 100  Temp(Src) 97.6 F (36.4 C) (Oral)  Resp 16  SpO2 98% Physical Exam  Nursing note and vitals  reviewed. Constitutional: She is oriented to person, place, and time. She appears well-developed and well-nourished. No distress.  HENT:  Head: Normocephalic and atraumatic.  Mouth/Throat: Oropharynx is clear and moist.  Eyes: EOM are normal. Pupils are equal, round, and reactive to light.  Neck: Normal range of motion. Neck supple.  Cardiovascular: Normal rate and regular rhythm.  Exam reveals no friction rub.   No murmur heard. Pulmonary/Chest: Effort normal and breath sounds normal. No respiratory distress. She has no wheezes. She has no rales.  Abdominal: Soft. She exhibits no distension. There is no tenderness. There is no rebound.  Musculoskeletal: She exhibits no edema.       Right knee: She exhibits normal range of motion, no swelling and no effusion. No tenderness found.       Left knee: She exhibits normal range of motion, no swelling and no effusion. No tenderness found.       Right ankle: She exhibits swelling (mild). She exhibits normal range of motion, no ecchymosis and no deformity. Tenderness (mild, diffuse).       Left ankle: She exhibits swelling (mild, diffuse). She exhibits normal range of motion and no ecchymosis. Tenderness (mild, diffuse).  Neurological: She is alert and oriented to person, place, and time.  Skin: She is not diaphoretic.    ED Course  Procedures (including critical care time) Labs Review Labs Reviewed  CBC  COMPREHENSIVE METABOLIC PANEL  SEDIMENTATION RATE  URINALYSIS, ROUTINE W REFLEX MICROSCOPIC  I-STAT CG4 LACTIC ACID, ED    Imaging Review Dg Chest 2 View  07/22/2014   CLINICAL DATA:  Lung cancer follow-up.  EXAM: CHEST  2 VIEW  COMPARISON:  July 15, 2014, October 23, 2019  FINDINGS: The heart size and mediastinal contours are stable. There is right lung volume loss. Right upper lobe perihilar airspace opacity is unchanged. A small calcified nodule is identified in the lateral left lung base unchanged. The soft tissues and osseous  structures are stable.  IMPRESSION: Right lung volume loss with right upper lobe perihilar airspace opacity unchanged compared to prior exam.   Electronically Signed   By: Abelardo Diesel M.D.   On: 07/22/2014 09:53   Dg Ankle Complete Left  07/22/2014   CLINICAL DATA:  Fever, bilateral ankle pain  EXAM: LEFT ANKLE COMPLETE - 3+ VIEW  COMPARISON:  None.  FINDINGS: Three views of left ankle submitted. No acute fracture or subluxation. Ankle mortise is preserved. Plantar spur of calcaneus.  IMPRESSION: No acute fracture or subluxation.  Plantar spur of calcaneus.   Electronically Signed   By: Lahoma Crocker M.D.   On: 07/22/2014 10:05   Dg Ankle Complete Right  07/22/2014   CLINICAL DATA:  Bilateral knee pain, bilateral ankle pain  EXAM: RIGHT ANKLE -  COMPLETE 3+ VIEW  COMPARISON:  None.  FINDINGS: Three views of right ankle submitted. No acute fracture or subluxation. Ankle mortise is preserved. Plantar spur of calcaneus.  IMPRESSION: No acute fracture or subluxation.  Plantar spur of calcaneus.   Electronically Signed   By: Lahoma Crocker M.D.   On: 07/22/2014 10:06   Dg Knee Complete 4 Views Left  07/22/2014   CLINICAL DATA:  Fever, cough, shortness of Breath bilateral knee pain, ankle pain  EXAM: LEFT KNEE - COMPLETE 4+ VIEW  COMPARISON:  None.  FINDINGS: Four views of the left knee submitted. No acute fracture or subluxation. Mild narrowing of medial joint compartment. Mild spurring of medial femoral condyle. Small joint effusion.  IMPRESSION: No acute fracture or subluxation.  Mild degenerative changes.   Electronically Signed   By: Lahoma Crocker M.D.   On: 07/22/2014 09:53   Dg Knee Complete 4 Views Right  07/22/2014   CLINICAL DATA:  Fever, shortness of breath, bilateral knee pain  EXAM: RIGHT KNEE - COMPLETE 4+ VIEW  COMPARISON:  None.  FINDINGS: Four views of the right knee submitted. No acute fracture or subluxation. Mild narrowing of medial joint compartment. Mild narrowing of patellofemoral joint space.   IMPRESSION: No acute fracture or subluxation.  Mild degenerative changes.   Electronically Signed   By: Lahoma Crocker M.D.   On: 07/22/2014 10:05     EKG Interpretation None      MDM   Final diagnoses:  Lower extremity pain, bilateral    73 year old female with history of stage III lung cancer presents with joint pain, fever. Bilateral knee and ankle pain for the past week. No trauma. She is currently receiving immunotherapy and is status post chemotherapy. 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 She had a fever yesterday 1.6. Also reports some vomiting. Denies any headaches, chest pain, shortness of breath, abdominal pain. Had CT last month showing interval progression of her disease. Has been to cancer center recently with dehydration, received fluids. Vitals at the cancer center showed some initial tachycardia, and then after resolution of tachycardia had some mild hypotension. Felt better after fluids boluses and went home with return precautions. Here with arthralgias, fever. Mild swelling of bilateral ankles without any erythema, cellulitis, deformity. Normal ROM of all her joints, mild tenderness in the ankles. Patient with normal BP here, mild tachycardia at 100. Will check labs, CXR, urine studies. Labs ok, xrays ok. I spoke with Dr. Julien Nordmann of Oncology about this patient - he stated pain meds are fine and she can f/u with the clinic as scheduled. No medical admission criteria identified.  Evelina Bucy, MD 07/22/14 [phone]

## 2014-07-22 NOTE — Progress Notes (Signed)
  CARE MANAGEMENT ED NOTE 07/22/2014  Patient:  Megan [name], Megan [name]   Account Number:  [id number]  Date Initiated:  07/22/2014  Documentation initiated by:  Jackelyn Poling  Subjective/Objective Assessment:   73 yr old [employer] medicare complete [place] pt c/o joint pain pmh cancer     Subjective/Objective Assessment Detail:   ED RN Caryl Pina spoke with ED CM about concern with pt possible need for home health or private duty nursing (PDN) services  Pt confirmed with CM ( female friend at bedside0 that pt lives alone  and had not used home health services before  Female friend lives near and was able to assist today but he feels pt was wobbly today  Pt noted to be nodding off and on during cm assessment     Action/Plan:   ED CM spoke with ED RN, pt and female friend at bedside Discussed in detail and provided written information on home health and PDN   Action/Plan Detail:   CM left a voice message for Polo Riley on her office and mobile numbers [phone]   Anticipated DC Date:       Status Recommendation to Physician:   Result of Recommendation:    Other ED [place]  Other  Outpatient Services - Pt will follow up   [place]   Choice offered to / List presented to:  C-1 Patient          Status of service:  Completed, signed off  ED Comments:   ED Comments Detail:    CM reviewed in details medicare guidelines, home health [facility]) (length of stay in home, types of [facility] staff available, coverage, primary caregiver, up to 24 hrs before services may be started), Private duty nursing (PDN-coverage, length of stay in the home types of staff available), CM provided a list of [place] home health agencies, PDN

## 2014-07-24 ENCOUNTER — Encounter (HOSPITAL_COMMUNITY): Payer: Medicare Other

## 2014-07-24 LAB — CULTURE, BLOOD (SINGLE)

## 2014-07-28 ENCOUNTER — Other Ambulatory Visit (HOSPITAL_BASED_OUTPATIENT_CLINIC_OR_DEPARTMENT_OTHER): Payer: Medicare Other

## 2014-07-28 ENCOUNTER — Ambulatory Visit (HOSPITAL_BASED_OUTPATIENT_CLINIC_OR_DEPARTMENT_OTHER): Payer: Medicare Other | Admitting: Oncology

## 2014-07-28 ENCOUNTER — Ambulatory Visit (HOSPITAL_BASED_OUTPATIENT_CLINIC_OR_DEPARTMENT_OTHER): Payer: Medicare Other

## 2014-07-28 ENCOUNTER — Encounter: Payer: Self-pay | Admitting: Oncology

## 2014-07-28 ENCOUNTER — Other Ambulatory Visit: Payer: Medicare Other

## 2014-07-28 ENCOUNTER — Ambulatory Visit: Payer: Medicare Other

## 2014-07-28 ENCOUNTER — Other Ambulatory Visit: Payer: Self-pay | Admitting: Medical Oncology

## 2014-07-28 VITALS — BP 99/68 | HR 106 | Temp 97.4°F | Resp 17 | Ht 66.0 in | Wt 143.8 lb

## 2014-07-28 DIAGNOSIS — M2550 Pain in unspecified joint: Secondary | ICD-10-CM

## 2014-07-28 DIAGNOSIS — C3490 Malignant neoplasm of unspecified part of unspecified bronchus or lung: Secondary | ICD-10-CM

## 2014-07-28 DIAGNOSIS — Z79899 Other long term (current) drug therapy: Secondary | ICD-10-CM

## 2014-07-28 DIAGNOSIS — C770 Secondary and unspecified malignant neoplasm of lymph nodes of head, face and neck: Secondary | ICD-10-CM

## 2014-07-28 DIAGNOSIS — C3491 Malignant neoplasm of unspecified part of right bronchus or lung: Secondary | ICD-10-CM

## 2014-07-28 DIAGNOSIS — C342 Malignant neoplasm of middle lobe, bronchus or lung: Secondary | ICD-10-CM

## 2014-07-28 DIAGNOSIS — Z5112 Encounter for antineoplastic immunotherapy: Secondary | ICD-10-CM

## 2014-07-28 DIAGNOSIS — R5381 Other malaise: Secondary | ICD-10-CM

## 2014-07-28 DIAGNOSIS — R5383 Other fatigue: Secondary | ICD-10-CM

## 2014-07-28 LAB — CBC WITH DIFFERENTIAL/PLATELET
BASO%: 0.9 % (ref 0.0–2.0)
BASOS ABS: 0.1 10*3/uL (ref 0.0–0.1)
EOS%: 5 % (ref 0.0–7.0)
Eosinophils Absolute: 0.4 10*3/uL (ref 0.0–0.5)
HCT: 40.6 % (ref 34.8–46.6)
HEMOGLOBIN: 13.1 g/dL (ref 11.6–15.9)
LYMPH%: 13.3 % — ABNORMAL LOW (ref 14.0–49.7)
MCH: 29.8 pg (ref 25.1–34.0)
MCHC: 32.1 g/dL (ref 31.5–36.0)
MCV: 92.7 fL (ref 79.5–101.0)
MONO#: 0.8 10*3/uL (ref 0.1–0.9)
MONO%: 9.6 % (ref 0.0–14.0)
NEUT#: 5.8 10*3/uL (ref 1.5–6.5)
NEUT%: 71.2 % (ref 38.4–76.8)
Platelets: 328 10*3/uL (ref 145–400)
RBC: 4.38 10*6/uL (ref 3.70–5.45)
RDW: 17 % — ABNORMAL HIGH (ref 11.2–14.5)
WBC: 8.2 10*3/uL (ref 3.9–10.3)
lymph#: 1.1 10*3/uL (ref 0.9–3.3)

## 2014-07-28 LAB — TECHNOLOGIST REVIEW

## 2014-07-28 LAB — COMPREHENSIVE METABOLIC PANEL (CC13)
ALK PHOS: 108 U/L (ref 40–150)
ALT: 23 U/L (ref 0–55)
AST: 20 U/L (ref 5–34)
Albumin: 3.4 g/dL — ABNORMAL LOW (ref 3.5–5.0)
Anion Gap: 11 mEq/L (ref 3–11)
BUN: 22.6 mg/dL (ref 7.0–26.0)
CO2: 23 mEq/L (ref 22–29)
Calcium: 9.7 mg/dL (ref 8.4–10.4)
Chloride: 104 mEq/L (ref 98–109)
Creatinine: 1.2 mg/dL — ABNORMAL HIGH (ref 0.6–1.1)
Glucose: 106 mg/dl (ref 70–140)
Potassium: 4.1 mEq/L (ref 3.5–5.1)
Sodium: 138 mEq/L (ref 136–145)
Total Bilirubin: 0.56 mg/dL (ref 0.20–1.20)
Total Protein: 6.9 g/dL (ref 6.4–8.3)

## 2014-07-28 LAB — TSH CHCC: TSH: 2.008 m(IU)/L (ref 0.308–3.960)

## 2014-07-28 MED ORDER — SODIUM CHLORIDE 0.9 % IV SOLN
Freq: Once | INTRAVENOUS | Status: AC
  Administered 2014-07-28: 15:00:00 via INTRAVENOUS

## 2014-07-28 MED ORDER — SODIUM CHLORIDE 0.9 % IV SOLN
200.0000 mg | Freq: Once | INTRAVENOUS | Status: AC
  Administered 2014-07-28: 200 mg via INTRAVENOUS
  Filled 2014-07-28: qty 20

## 2014-07-28 MED ORDER — IBUPROFEN 400 MG PO TABS: 400.0000 mg | ORAL_TABLET | Freq: Four times a day (QID) | ORAL | Status: DC | PRN

## 2014-07-28 NOTE — Progress Notes (Signed)
[place] [phone]   [phone]  SHARED VISIT PROGRESS NOTE  [name], MD [address] [place] [address]  DIAGNOSIS: Squamous cell lung cancer  Primary site: Lung (Right)  Staging method: AJCC 7th Edition  Clinical: Stage IIIB (T3, N3, M0)  Summary: Stage IIIB (T3, N3, M0)   PRIOR THERAPY:  1. Concurrent chemoradiation with weekly chemotherapy in the form of carboplatin for AUC of 2 and paclitaxel 45 mg/m2. Status post 7 week of treatment.  Last dose was given 01/27/2014 no significant response in her disease.  2. Consolidation chemotherapy with carboplatin for AUC of 5 and paclitaxel 175 mg/M2 every 3 weeks with Neulasta support. First dose on 04/15/2014. Status post 3 cycles. Carboplatin was discontinued starting cycle #2 secondary to hypersensitivity reaction.  CURRENT THERAPY: Immunotherapy with Opdivo (Nivolumab) 3mg /kg given every 2 weeks. Status post 2 cycles  CHEMOTHERAPY INTENT: Control/Palliative  CURRENT # OF CHEMOTHERAPY CYCLES: 3 CURRENT ANTIEMETICS: Zofran, dexamethasone, Compazine  CURRENT SMOKING STATUS: Former smoker, quit 12/05/1997  ORAL CHEMOTHERAPY AND CONSENT: n/a  CURRENT BISPHOSPHONATES USE: None  PAIN MANAGEMENT: no cancer related pain, patient does have RA  NARCOTICS INDUCED CONSTIPATION: none  LIVING WILL AND CODE STATUS: ?   INTERVAL HISTORY: Megan HODKINSON 73 y.o. female returns to the clinic today for followup visit accompanied by her friend. She had more arthralgias after her second cycle of Nivolumab. Was seen in the ER for this pain. Was given Percocet which she said did not work. Pain is diffuse throughout her body. States that her PCP gave her Flexeril which has helped some. She otherwise has had not fevers or chills. No changes in her baseline shortness of breath. She continues have neuropathy in both her toes and fingertips. It is more predominant in her toes,  affecting her balance. She does occasionally trip but has had no falls. Regarding her fingertips her thumbs are affected the greatest. She complains of chronic cough which is worse after eating. She feels food may be "going down the wrong way". She continue to have some difficulty with her esophagus and is followed by gastroenterology. She tells her that she is to see a new gastroenterologist, Dr. Deatra Ina, in October. She complains of leg weakness. She continues with physical therapy as ordered by Dr. Chase Caller. She voices no other specific complaints today. The patient denied having any significant weight loss or night sweats. She has no current nausea or vomiting. She denied having any significant chest pain but continues to have shortness breath with exertion with  cough but no hemoptysis.   MEDICAL HISTORY: Past Medical History  Diagnosis Date  . Hypertension   . Diabetes mellitus   . Rheumatoid arthritis   . GERD (gastroesophageal reflux disease)     no meds for  . Hx of radiation therapy 12/16/13-01/30/14    lung 66Gy  . Cancer     LUNG    ALLERGIES:  is allergic to remicade and carboplatin.  MEDICATIONS:  Current Outpatient Prescriptions  Medication Sig Dispense Refill  . Biotin 5000 MCG CAPS Take 1 capsule by mouth daily.       [name] co-enzyme Q-10 50 MG capsule Take 50 mg by mouth daily.      [name] docusate sodium (COLACE) 100 MG capsule Take 1 capsule (100 mg total) by mouth every 12 (twelve) hours.  60 capsule  0  . Fluticasone-Salmeterol (ADVAIR) 100-50 MCG/DOSE AEPB Inhale 1 puff into the lungs 2 (two)  times daily.      . magnesium oxide (MAG-OX) 400 MG tablet Take 400 mg by mouth daily.      . meloxicam (MOBIC) 15 MG tablet Take 15 mg by mouth daily.      . pantoprazole (PROTONIX) 40 MG tablet Take 40 mg by mouth daily.      . prochlorperazine (COMPAZINE) 10 MG tablet Take 10 mg by mouth every 6 (six) hours as needed for nausea or vomiting.      . vitamin B-12 (CYANOCOBALAMIN) 100 MCG  tablet Take 100 mcg by mouth daily.      [name] ibuprofen (ADVIL,MOTRIN) 400 MG tablet Take 1 tablet (400 mg total) by mouth every 6 (six) hours as needed. Take with food.  30 tablet  0  . losartan-hydrochlorothiazide (HYZAAR) 100-25 MG per tablet Take 1 tablet by mouth daily.      [name] oxyCODONE-acetaminophen (PERCOCET) 5-325 MG per tablet Take 1 tablet by mouth every 6 (six) hours as needed for moderate pain.  20 tablet  0   No current facility-administered medications for this visit.   Facility-Administered Medications Ordered in Other Visits  Medication Dose Route Frequency Provider Last Rate Last Dose  . nivolumab (OPDIVO) 200 mg in sodium chloride 0.9 % 100 mL chemo infusion  200 mg Intravenous Once Curt Bears, MD      . sodium chloride 0.9 % injection 10 mL  10 mL Intracatheter PRN Curt Bears, MD        SURGICAL HISTORY:  Past Surgical History  Procedure Laterality Date  . Cholecystectomy    . Abdominal hysterectomy    . Dilation and curettage of uterus    . Abdominal surgery      REVIEW OF SYSTEMS:  Constitutional: positive for chills and no documented fever Eyes: negative Ears, nose, mouth, throat, and face: negative Respiratory: positive for cough and dyspnea on exertion Cardiovascular: negative Gastrointestinal: positive for dyspepsia and odynophagia Genitourinary:negative Integument/breast: negative Hematologic/lymphatic: negative Musculoskeletal:positive for muscle weakness Neurological: negative Behavioral/Psych: negative Endocrine: negative Allergic/Immunologic: negative   PHYSICAL EXAMINATION: General appearance: alert, cooperative and no distress Head: Normocephalic, without obvious abnormality, atraumatic Neck: no adenopathy, no JVD, supple, symmetrical, trachea midline and thyroid not enlarged, symmetric, no tenderness/mass/nodules Lymph nodes: Cervical, supraclavicular, and axillary nodes normal. Resp: clear to auscultation bilaterally Back: symmetric,  no curvature. ROM normal. No CVA tenderness. Cardio: regular rate and rhythm, S1, S2 normal, no murmur, click, rub or gallop GI: soft, non-tender; bowel sounds normal; no masses,  no organomegaly Extremities: extremities normal, atraumatic, no cyanosis or edema Neurologic: Alert and oriented X 3, normal strength and tone. Normal symmetric reflexes. Normal coordination and gait  ECOG PERFORMANCE STATUS: 1 - Symptomatic but completely ambulatory  Blood pressure 99/68, pulse 106, temperature 97.4 F (36.3 C), temperature source Oral, resp. rate 17, height 5\' 6"  (1.676 m), weight 143 lb 12.8 oz (65.227 kg), SpO2 99.00%.  LABORATORY DATA: Lab Results  Component Value Date   WBC 8.2 07/28/2014   HGB 13.1 07/28/2014   HCT 40.6 07/28/2014   MCV 92.7 07/28/2014   PLT 328 07/28/2014      Chemistry      Component Value Date/Time   NA 138 07/28/2014 1326   NA 136* 07/22/2014 0955   K 4.1 07/28/2014 1326   K 4.4 07/22/2014 0955   CL 103 07/22/2014 0955   CO2 23 07/28/2014 1326   CO2 20 07/22/2014 0955   BUN 22.6 07/28/2014 1326   BUN 10 07/22/2014 0955   CREATININE  1.2* 07/28/2014 1326   CREATININE 0.67 07/22/2014 0955      Component Value Date/Time   CALCIUM 9.7 07/28/2014 1326   CALCIUM 8.7 07/22/2014 0955   ALKPHOS 108 07/28/2014 1326   ALKPHOS 100 07/22/2014 0955   AST 20 07/28/2014 1326   AST 19 07/22/2014 0955   ALT 23 07/28/2014 1326   ALT 24 07/22/2014 0955   BILITOT 0.56 07/28/2014 1326   BILITOT 0.5 07/22/2014 0955       RADIOGRAPHIC STUDIES: Ct Chest W Contrast  06/13/2014   CLINICAL DATA:  Re- states lung cancer.  EXAM: CT CHEST WITH CONTRAST  TECHNIQUE: Multidetector CT imaging of the chest was performed during intravenous contrast administration.  CONTRAST:  57mL OMNIPAQUE IOHEXOL 300 MG/ML  SOLN  COMPARISON:  CT chest 02/28/2014  FINDINGS: Re- demonstrated 2 cm heterogeneous left thyroid mass. Normal heart size. No pericardial effusion. Small hiatal hernia. Calcified mediastinal lymph  nodes.  Interval development of right middle lobe and partial right upper lobe collapse which largely obscures the expected location of the previously described right upper lobe mass making measurement and comparison with prior difficult. This is likely secondary to a new 1.6 cm right hilar soft tissue mass. There is central lower attenuation within the region of atelectasis which measures 3.8 x 2.1 cm, favored to represent the previously described mass which is somewhat increased in size when compared to prior were it measured 2.9 x 2.7 cm.  There is abrupt cut off of the right middle lobe bronchus. Interval development of a 1.4 cm subpleural nodular opacity within the right lower lobe (image 29). Additionally there has been interval development of multiple mixed solid and ground-glass opacities within the right lower lobe measuring 2.4 cm, 1.8 cm and 1.4 cm respectively. Unchanged calcified nodule within the peripheral aspect of the left lower lobe.  Imaging of the upper abdomen demonstrates unchanged low-attenuation hepatic lesions measuring 1.6 cm and 0.9 cm, favored to represent cysts. Status post cholecystectomy. Normal adrenal glands. Calcified granulomas in the spleen.  Mid thoracic spine degenerative change. No aggressive or acute appearing osseous lesions.  IMPRESSION: 1. Interval development of right middle lobe and partial right upper lobe atelectasis likely secondary to new right hilar soft tissue mass. The atelectasis largely obscures the previously described right upper lobe mass however the mass in the right upper lobe does appear to have increased in size when compared to prior examination. Additionally there is new abrupt cut off of the right middle lobe bronchus resulting in the above described right middle lobe atelectasis. Overall findings are concerning for disease progression. 2. Interval development of subpleural soft tissue nodule within the right lower lobe concerning for additional site of  disease. An additional consideration given the location and appearance would be pulmonary infarct although this is felt to be less likely. 3. Interval development of multiple large solid and ground-glass opacities within the aerated right lower lobe. These may potentially be infectious, inflammatory or post treatment related however metastatic disease is of concern.   Electronically Signed   By: Lovey Newcomer M.D.   On: 06/13/2014 11:14    ASSESSMENT AND PLAN: This is a very pleasant 73 years old white female with history of stage IIIB non-small cell lung cancer status post a course of concurrent chemoradiation with weekly carboplatin and paclitaxel status post 7 cycles. The patient tolerated her treatment fairly well with no significant adverse effects except for the radiation induced esophagitis. She received 3 cycles of consolidation chemotherapy with carboplatin  and paclitaxel however carboplatin was discontinued with cycle #2 secondary to hypersensitivity reaction. Her recent restaging CT scan of the chest reveals evidence for disease progression. She's now being treated with immunotherapy with nivolumab and 3 mg per kilogram given every 2 weeks, status post 2 cycles. The patient was discussed with and also seen by Dr. Julien Nordmann. Overall she is tolerating her immunotherapy without difficulty. She will proceed with cycle #3 today as scheduled. She'll followup in 2 weeks prior to cycle #4. Will plan to do a restaging CT scan after she completes 4 cycles.   For her arthralgias, I have prescribed Ibuprofen 400 mg every 6 hours. Will consider Voltaren gel if this is not effective.   She is encouraged to followup with gastroenterology as scheduled. She was advised to call immediately if she has any concerning symptoms in the interval. The patient voices understanding of current disease status and treatment options and is in agreement with the current care plan.  All questions were answered. The patient knows to  call the clinic with any problems, questions or concerns. We can certainly see the patient much sooner if necessary.   Mikey Bussing, NP 07/28/2014  ADDENDUM: Hematology/Oncology Attending: I had a face to face encounter with the patient. I recommended her care plan. This is a very pleasant 73 years old white female with a stage IIIB non-small cell lung cancer that showed evidence for disease progression recently and the patient was started on treatment with immunotherapy with Nivolumab status post 2 cycles. She is tolerating her treatment with immunotherapy fairly well but the patient continues to have some other complaints including arthralgia, dysphagia as well as weakness. She is currently undergoing pulmonary rehabilitation under the care of Dr. Chase Caller. She has no significant complaints related to the immunotherapy at this point. She denied having any significant skin rash, diarrhea. Her serum creatinines just above the normal range and it will need close monitoring. I recommended for the patient to proceed with cycle #3 today as scheduled. For the arthralgia, we started the patient on Motrin 400 mg by mouth every 6 hours as needed for pain. She would come back for followup visit in 2 weeks for reevaluation before starting cycle #4. She would have repeat imaging studies for restaging of her disease after cycle 4.  She was advised to call immediately if she has any concerning symptoms in the interval.  Disclaimer: This note was dictated with voice recognition software. Similar sounding words can inadvertently be transcribed and may be missed upon review. Eilleen Kempf., MD 07/30/2014

## 2014-07-28 NOTE — Patient Instructions (Signed)
[facility] Discharge Instructions for Patients Receiving Chemotherapy  Today you received the following chemotherapy agents; Nivolumab.   To help prevent nausea and vomiting after your treatment, we encourage you to take your nausea medication as directed.    If you develop nausea and vomiting that is not controlled by your nausea medication, call the clinic.   BELOW ARE SYMPTOMS THAT SHOULD BE REPORTED IMMEDIATELY:  *FEVER GREATER THAN 100.5 F  *CHILLS WITH OR WITHOUT FEVER  NAUSEA AND VOMITING THAT IS NOT CONTROLLED WITH YOUR NAUSEA MEDICATION  *UNUSUAL SHORTNESS OF BREATH  *UNUSUAL BRUISING OR BLEEDING  TENDERNESS IN MOUTH AND THROAT WITH OR WITHOUT PRESENCE OF ULCERS  *URINARY PROBLEMS  *BOWEL PROBLEMS  UNUSUAL RASH Items with * indicate a potential emergency and should be followed up as soon as possible.  Feel free to call the clinic you have any questions or concerns. The clinic phone number is [phone].

## 2014-07-29 ENCOUNTER — Ambulatory Visit (INDEPENDENT_AMBULATORY_CARE_PROVIDER_SITE_OTHER): Payer: Medicare Other | Admitting: Internal Medicine

## 2014-07-29 ENCOUNTER — Telehealth: Payer: Self-pay | Admitting: Internal Medicine

## 2014-07-29 ENCOUNTER — Encounter (HOSPITAL_COMMUNITY): Payer: Medicare Other

## 2014-07-29 ENCOUNTER — Encounter: Payer: Self-pay | Admitting: Internal Medicine

## 2014-07-29 ENCOUNTER — Telehealth (HOSPITAL_COMMUNITY): Payer: Self-pay

## 2014-07-29 VITALS — BP 110/78 | HR 103 | Ht 66.0 in | Wt 144.0 lb

## 2014-07-29 DIAGNOSIS — R0609 Other forms of dyspnea: Secondary | ICD-10-CM

## 2014-07-29 DIAGNOSIS — G893 Neoplasm related pain (acute) (chronic): Secondary | ICD-10-CM

## 2014-07-29 DIAGNOSIS — J9819 Other pulmonary collapse: Secondary | ICD-10-CM | POA: Insufficient documentation

## 2014-07-29 DIAGNOSIS — N289 Disorder of kidney and ureter, unspecified: Secondary | ICD-10-CM | POA: Insufficient documentation

## 2014-07-29 DIAGNOSIS — R0600 Dyspnea, unspecified: Secondary | ICD-10-CM

## 2014-07-29 DIAGNOSIS — R5381 Other malaise: Secondary | ICD-10-CM

## 2014-07-29 DIAGNOSIS — T451X5A Adverse effect of antineoplastic and immunosuppressive drugs, initial encounter: Secondary | ICD-10-CM

## 2014-07-29 DIAGNOSIS — G620 Drug-induced polyneuropathy: Secondary | ICD-10-CM

## 2014-07-29 DIAGNOSIS — C3490 Malignant neoplasm of unspecified part of unspecified bronchus or lung: Secondary | ICD-10-CM

## 2014-07-29 DIAGNOSIS — R64 Cachexia: Secondary | ICD-10-CM | POA: Insufficient documentation

## 2014-07-29 DIAGNOSIS — R5383 Other fatigue: Secondary | ICD-10-CM

## 2014-07-29 DIAGNOSIS — C3491 Malignant neoplasm of unspecified part of right bronchus or lung: Secondary | ICD-10-CM

## 2014-07-29 DIAGNOSIS — R0989 Other specified symptoms and signs involving the circulatory and respiratory systems: Secondary | ICD-10-CM

## 2014-07-29 DIAGNOSIS — R530 Neoplastic (malignant) related fatigue: Secondary | ICD-10-CM

## 2014-07-29 MED ORDER — DULOXETINE HCL 30 MG PO CPEP: 30.0000 mg | ORAL_CAPSULE | Freq: Every day | ORAL | Status: DC

## 2014-07-29 NOTE — Patient Instructions (Addendum)
#  Fatigue and anorexia  - fatigue and anorexia can be from disesase or from new drug Nivolumab  - agree with oxycodone  - agree with home PT - I will discuss with Dr Julien Nordmann  - I will see if I can get you to outpatient palliative care program if I Can find one   #PEripheral neuropathy  - this is due to pripor chemo cycle  - start duloxetine (cymbalta) 30 mg once daily for 1 week before increasing to 60 mg once day  - note if fatigue or headache worsen, call us immediately  #Body pain  - unclear cause but platin neuropathy playing a role  - agree with oxycodone  - I will see if I can get you to outpatient palliative care program if I Can find one  #Shortness of breath  - likely to mild emphysema, right middle lobe collapse (new aug 2015) - keep in mind Nivolumab can cause this problem too  - continue advair - oxycodone can help this potentially   #Acute kidney insufficiency  - new 07/28/14 and might be dehydration or related to Nivolumab - please do repeat chemistry with Dr Julien Nordmann or [name], MD in one week  #FOllowup - in 1 week you should have repeat blood test of CBC., BMET, and LFT - here or with PCP [name], MD  - retur in 4 weeks to see me - [place] Symptom Assessment (ESAS) score for you at followup

## 2014-07-29 NOTE — Progress Notes (Signed)
Subjective:    Patient ID: Megan [name], female    DOB: September 23, 1941, 73 y.o.   MRN: [id number]  HPI   OV 07/29/2014  Chief Complaint  Patient presents with  . Follow-up    Pt c/o increase in SOB and fatigue. Pt denies CP/tightness and cough.    She has stage 3B NSCLC. Her pulmonary problems pre lung cancer was limited to smoking hx with isolated low DLCO only and she was largely asymptomatic. In June 2015 she had some dyspnea and I started her on empiric advair that helped. She is coming now for routien fu  I am learning from her and review of recordds that she failed First line platin, paclitaxel Rx with last dose march 2015; no response in disesae. She moved onto consolidatin chemo with platin and paclitaxel through June 2015 but by June 2015 she develped severe peripheral neuropathy resulting in dc of platin Rx.  Then early sept 2013 strated on Nivolumab Rx IV Q2 weeks. She says tolerated first cycle fine. Got second cycle mid-sept 2015 along with flu shot and after that significant decompensation. Within a few days, pain got worse - in feet, thigh, mid back and worsenign appetite, detrioration in ECOG to 3-4 from 0-1 and worsening dyspnea. . Of note, CT aug 2015 before starting nivolumab showed new onset of RML collapse due to didsease. Now [facility] barely is able to do ADLs, needing cane, gets pushed around to doc office in wheel chair. She has started home PT. Advair helping but continues to have class 2-3 dyspnea on exertion  releived by rest,. She did get chemo Nivolumab (cycle #3) yesterday 07/28/14.    ESAS symptom score below captures her current problem. Respiratory is not her main issue. Below shows significant symptom burder all around esp neuropathic pain, fatiue, anxiety, tiredness, appetite and dyspnea,. Sounds like she has recently been given choice of advil, flexeril and oxycodone to help with pain. She is willing to try CYMBALTA (Duloxeteine)  that can help with platin  neuropathy and depression.    Labs show spike in creatinine yesterday - doubling - can be seen as delayed side effect of nivolumab       [place] Symptom Assessment Numerical Scale 0 is no problem -> 10 worst problem 07/29/2014    No Pain -> Worst pain 6, in mid scapula, left shoulder, buttock and thigh and feet - did have platin neuropathy  No Tiredness -> Worset tiredness 9  No Nausea -> Worst nausea 1  No Depression -> Worst depression 4  No Anxiety -> Worst Anxiety 6  No Drowsiness -> Worst Drowsiness 10  Best appetite-> Worst Appetitle 7  Best Feeling of well being -> Worst feeling 1  No dyspnea-> Worst dyspnea 9  Other problem (none -> severe) none  Completed by          Recent Labs Lab 07/28/14 1326  HGB 13.1  HCT 40.6  WBC 8.2  PLT 328     Recent Labs Lab 07/28/14 1326  NA 138  K 4.1  CO2 23  GLUCOSE 106  BUN 22.6  CREATININE 1.2*  CALCIUM 9.7      Review of Systems  Constitutional: Positive for fatigue. Negative for fever and unexpected weight change.  HENT: Negative for congestion, dental problem, ear pain, nosebleeds, postnasal drip, rhinorrhea, sinus pressure, sneezing, sore throat and trouble swallowing.   Eyes: Negative for redness and itching.  Respiratory: Positive for shortness of breath. Negative for cough, chest tightness and  wheezing.   Cardiovascular: Negative for palpitations and leg swelling.  Gastrointestinal: Negative for nausea and vomiting.  Genitourinary: Negative for dysuria.  Musculoskeletal: Negative for joint swelling.  Skin: Negative for rash.  Neurological: Negative for headaches.  Hematological: Does not bruise/bleed easily.  Psychiatric/Behavioral: Negative for dysphoric mood. The patient is not nervous/anxious.    Current outpatient prescriptions:Biotin 5000 MCG CAPS, Take 1 capsule by mouth daily. , Disp: , Rfl: ;  co-enzyme Q-10 50 MG capsule, Take 50 mg by mouth daily., Disp: , Rfl: ;  cyclobenzaprine (FLEXERIL)  5 MG tablet, Take 5 mg by mouth 3 (three) times daily as needed for muscle spasms., Disp: , Rfl: ;  docusate sodium (COLACE) 100 MG capsule, Take 1 capsule (100 mg total) by mouth every 12 (twelve) hours., Disp: 60 capsule, Rfl: 0 Fluticasone-Salmeterol (ADVAIR) 100-50 MCG/DOSE AEPB, Inhale 1 puff into the lungs 2 (two) times daily., Disp: , Rfl: ;  ibuprofen (ADVIL,MOTRIN) 400 MG tablet, Take 1 tablet (400 mg total) by mouth every 6 (six) hours as needed. Take with food., Disp: 30 tablet, Rfl: 0;  magnesium oxide (MAG-OX) 400 MG tablet, Take 400 mg by mouth daily., Disp: , Rfl: ;  meloxicam (MOBIC) 15 MG tablet, Take 15 mg by mouth daily., Disp: , Rfl:  oxyCODONE-acetaminophen (PERCOCET) 5-325 MG per tablet, Take 1 tablet by mouth every 6 (six) hours as needed for moderate pain., Disp: 20 tablet, Rfl: 0;  prochlorperazine (COMPAZINE) 10 MG tablet, Take 10 mg by mouth every 6 (six) hours as needed for nausea or vomiting., Disp: , Rfl: ;  vitamin B-12 (CYANOCOBALAMIN) 100 MCG tablet, Take 100 mcg by mouth daily., Disp: , Rfl:  No current facility-administered medications for this visit. Facility-Administered Medications Ordered in Other Visits: sodium chloride 0.9 % injection 10 mL, 10 mL, Intracatheter, PRN, Curt Bears, MD      Objective:   Physical Exam  Vitals reviewed. Constitutional: She is oriented to person, place, and time. She appears well-developed and well-nourished. No distress.  Sitting in wheel chair Body mass index is 23.25 kg/(m^2). ecog now3-4 Looks much weaker Has a cane  HENT:  Head: Normocephalic and atraumatic.  Right Ear: External ear normal.  Left Ear: External ear normal.  Mouth/Throat: Oropharynx is clear and moist. No oropharyngeal exudate.  Wig +  Eyes: Conjunctivae and EOM are normal. Pupils are equal, round, and reactive to light. Right eye exhibits no discharge. Left eye exhibits no discharge. No scleral icterus.  Neck: Normal range of motion. Neck supple. No  JVD present. No tracheal deviation present. No thyromegaly present.  Cardiovascular: Normal rate, regular rhythm, normal heart sounds and intact distal pulses.  Exam reveals no gallop and no friction rub.   No murmur heard. Pulmonary/Chest: Effort normal and breath sounds normal. No respiratory distress. She has no wheezes. She has no rales. She exhibits no tenderness.  Abdominal: Soft. Bowel sounds are normal. She exhibits no distension and no mass. There is no tenderness. There is no rebound and no guarding.  Musculoskeletal: Normal range of motion. She exhibits no edema and no tenderness.  Lymphadenopathy:    She has no cervical adenopathy.  Neurological: She is alert and oriented to person, place, and time. She has normal reflexes. No cranial nerve deficit. She exhibits normal muscle tone. Coordination normal.  Skin: Skin is warm and dry. No rash noted. She is not diaphoretic. No erythema. No pallor.  Psychiatric: She has a normal mood and affect. Her behavior is normal. Judgment and thought content  normal.    Filed Vitals:   07/29/14 1353  BP: 110/78  Pulse: 103  Height: 5\' 6"  (1.676 m)  Weight: 144 lb (65.318 kg)  SpO2: 98%          Assessment & Plan:  Overall deterioration. Multiple symptioms. High palliative need for physical disterss control.. Symptoms could be frfom NIVOLUMAB v disease  #Fatigue and anorexia  - fatigue and anorexia can be from disesase or from new drug Nivolumab  - agree with oxycodone  - agree with home PT - I will discuss with Dr Julien Nordmann  - I will see if I can get you to outpatient palliative care program if I Can find one (update: d/w [name] NP with pall care who can see her as opd)   #PEripheral neuropathy  - this is due to pripor chemo cycle with PLATIN  - start duloxetine (cymbalta) 30 mg once daily for 1 week before increasing to 60 mg once day  - note if fatigue or headache worsen with this dru, call us immediately  #Body pain  -  unclear cause but platin neuropathy playing a role  - agree with oxycodone  - I will see if I can get you to outpatient palliative care program if I Can find one  #Shortness of breath  - likely to mild emphysema, right middle lobe collapse (new aug 2015), cancer fatigue or NIVOLUMAB - keep in mind Nivolumab can cause this problem too  - continue advair - oxycodone can help this potentially - will consider oral morphine depending on course  #Acute kidney insufficiency  - new 07/28/14 and might be dehydration or related to Nivolumab - please do repeat chemistry with Dr Julien Nordmann or [name], MD in one week  #FOllowup - in 1 week you should have repeat blood test of CBC., BMET, and LFT - here or with PCP [name], MD  - retur in 4 weeks to see me - [place] Symptom Assessment (ESAS) score for you at followup                 .Dr. Brand Males, M.D., [facility].C.P Pulmonary and Critical Care Medicine Staff Physician [place] [facility] Pager: [phone], If no answer or between  15:00h - 7:00h: call [phone]  07/30/2014 9:27 AM

## 2014-07-29 NOTE — Progress Notes (Signed)
Discharge note: Patient is being discharged from pulmonary rehab related to a change in patients condition. Megan [name] is currently receiving treatment for lung cancer, which is causing a flare up with her rheumatoid arthritis. Per the patient, she has seen several physicians regarding this new onset of leg/foot/body pain including a visit to the ED on 9/22. She has been prescribed pain medication which the patient says is not working. She is having an extremely hard time walking. She is due to receive her first home health visit this afternoon. Megan [name] attended 6 exercise sessions before her untimely discharge. She did not meet her goal of increasing her stamina and strength and managing her shortness of breath with long conversations.

## 2014-07-29 NOTE — Telephone Encounter (Signed)
Please tell Megan [name] that [name] NP of palliative care will be able to see her to help her with her physical symptoms. Will be symptom based support. Patient is to call [name] directly on cell [phone] to set up appt. I highly recommend it  THanks  Dr. Brand Males, M.D., [facility].C.P Pulmonary and Critical Care Medicine Staff Physician [place] [facility] Pager: [phone], If no answer or between  15:00h - 7:00h: call [phone]  07/29/2014 5:16 PM

## 2014-07-29 NOTE — Telephone Encounter (Signed)
s.w. pt and advised on time change on 10.12....pt ok adn aware

## 2014-07-30 ENCOUNTER — Telehealth: Payer: Self-pay | Admitting: Internal Medicine

## 2014-07-30 NOTE — Telephone Encounter (Signed)
Called and spoke to pt. Informed pt of recs per MR. Phone number provided. Pt verbalized understanding and denied any further questions or concerns at this time.

## 2014-07-30 NOTE — Telephone Encounter (Signed)
We will check it again.

## 2014-07-30 NOTE — Telephone Encounter (Signed)
Hi Megan [name]  Wondering if [place] deterioration with dyspnea, fatigue, increased creat is Nivolumab related? Your thoughts?   She had a high creat 2 days ago: doubling of her creat. Could your office recheck this please in a week? Or you want me to?  Thanks  [employer]

## 2014-07-31 ENCOUNTER — Encounter (HOSPITAL_COMMUNITY): Payer: Medicare Other

## 2014-07-31 ENCOUNTER — Ambulatory Visit: Payer: Medicare Other | Admitting: Gastroenterology

## 2014-08-04 ENCOUNTER — Ambulatory Visit
Admission: RE | Admit: 2014-08-04 | Discharge: 2014-08-04 | Disposition: A | Discharge status: Home / Self Care | Payer: Medicare Other | Source: Ambulatory Visit | Attending: Internal Medicine | Admitting: Internal Medicine

## 2014-08-04 DIAGNOSIS — R530 Neoplastic (malignant) related fatigue: Secondary | ICD-10-CM

## 2014-08-04 DIAGNOSIS — M792 Neuralgia and neuritis, unspecified: Secondary | ICD-10-CM

## 2014-08-04 DIAGNOSIS — Z7189 Other specified counseling: Secondary | ICD-10-CM

## 2014-08-04 DIAGNOSIS — Z515 Encounter for palliative care: Secondary | ICD-10-CM

## 2014-08-04 NOTE — Consult Note (Signed)
Patient FX:[id number] [name]      DOB: 1941/03/07      HGD:[id number]     Consult Note from the Palliative Medicine Team at [place] Requested by:     PCP: Megan Bolt, MD Reason for Consultation              Phone                                                                                                                                Number:[phone]  Assessment of patients Current state:  Patient struggles with the physical and emotional components of life with a serious illness.   Consult is for introduction to the concept of Palliative Medicine, clarification of Advanced Directives,  holistic support and symptom management as indicated.   This NP Megan [name] reviewed medical records, received report from team, assessed the patient and then meet with  Megan [name] and her friend Megan [name] [phone]  in the outpatient radiation-oncology clinic.   A detailed discussion was had today regarding advanced directives.  Concepts specific to code status, artifical feeding and hydration, continued IV antibiotics and rehospitalization was had.   Values and goals of care important to patient  were attempted to be elicited.  Concept of  Palliative Care was discussed   Questions and concerns addressed. Patient  encouraged to call with questions or concerns.    Curriculum Parameters Description of the Parameter Patient's Response  C all it what it is  Please give me a summary of what the doctors have told you about your illness. Try to call it by name, and summarize what you heard the doctor say about where are you going next. Example: I have cancer XYZ. It is located XYZ . We are going to start chemo/radiation on XYZ...   "I know I have cancer"  O ptimize and Organize List your current symptoms i.e pain, constipation, depression, anger, nausea, insomnia, vomiting diarrhea. Supportive and palliative- care is all about optimizing your symptom management.     Start to organize your thoughts, your calendar, your important documents. Who would you want to help you with this journey? Who would you trust to make decisions for you if you could not make them yourself (your surrogate)?  Fatigue, poor appetite        P lanning Start working on your advanced directives, your living will and your code status with the help of the social worker and your oncologist. It is never to early to have these in order so you can enjoy each moment.   Consider working on your future goals for if your treatments are not going the way you want them to. Take charge with the help and guidance from your oncologist!     What else do you want to plan? A birthday, anniversary, a trip? Strike while the iron is hot. Set one goal each day.  MOST form introduced  and completed  E nergize What energizes and gets you up in the morning?   How is your family supporting you? How are they holding up? Would it be helpful to talk with our social worker about things related to your care and care givers?   What hobbies do you have and how can we creatively adjust them to meet your physical needs at this time?  I want to get back to my work at  the [place] Symptom Assessment Numerical Scale 0 is no problem -> 10 worst problem  08-04-14  No Pain -> Worst pain  4-reports continued "numb" pain in left hand/fingers  No Tiredness -> Worset tiredness  7  No Nausea -> Worst nausea  0  No Depression -> Worst depression  4   No Anxiety -> Worst Anxiety  5   No Drowsiness -> Worst Drowsiness  7  Best appetite-> Worst Appetitle  5  Best Feeling of well being -> Worst feeling  1  No dyspnea-> Worst dyspnea  5  Other problem (none -> severe)  none   Completed by        Overall improvement on [place] Scale, "I am definitely feeling better".  Note improved rating from previous [place]   Goals of Care: 1.  Code Status:  DNR/DNI   2. Scope of Treatment:  At this time patient is open to all availble and offered medcial interventions to prolong quality life.   4. Symptom Management:   1. Fatigue: -Pace yourself -Plan your day -Include naps and breaks -schedule a relaxing day -get a little exercise -fuel the body -consider complementary therapies   -deep breathing   -prayer/medication   -guided meditation   2. Pain (nueropathic) increase Cymbalta to 60 mg daily.  Discussed with Dr Purnell Shoemaker  5. Psychosocial:  Emotional support offered to patient.  Created space for her to share her thoughts and feeling regarding her experience with life limiting illness.  She expresses gratitude for today's conversation, "Sometimes you just need to talk about things"  6. Spiritual:  Strong community church support   Patient Documents Completed or Given: Document Given Completed  Advanced Directives Pkt    MOST  X  DNR  X  Gone from My Sight    Hard Choices      Brief HPI:    DIAGNOSIS: Squamous cell lung cancer  Primary site: Lung (Right)  Staging method: AJCC 7th Edition  Clinical: Stage IIIB (T3, N3, M0)  Summary: Stage IIIB (T3, N3, M0)  PRIOR THERAPY:  1) Concurrent chemoradiation with weekly chemotherapy in the form of carboplatin for AUC of 2 and paclitaxel 45 mg/m2. Status post 7 week of treatment. Last dose was given 01/27/2014 no significant response in her disease.  2) Consolidation chemotherapy with carboplatin for AUC of 5 and paclitaxel 175 mg/M2 every 3 weeks with Neulasta support. First dose on 04/15/2014. Status post 3 cycles. Carboplatin was discontinued starting cycle #2 secondary to hypersensitivity reaction.  CURRENT THERAPY: Immunotherapy with Opdivo (Nivolumab) 3mg /kg given every 2 weeks. Status post 3 cycles.  CHEMOTHERAPY INTENT: Control/Palliative  CURRENT # OF CHEMOTHERAPY CYCLES: 4  CURRENT ANTIEMETICS: Zofran, dexamethasone, Compazine  CURRENT SMOKING STATUS: Former  smoker, quit 12/05/1997  ORAL CHEMOTHERAPY AND CONSENT: n/a  CURRENT BISPHOSPHONATES USE: None  PAIN MANAGEMENT: no cancer related pain, patient does have RA  NARCOTICS INDUCED CONSTIPATION: none  LIVING WILL AND CODE STATUS: DNR/DNI-documented today   ROS: weakness, fatigue,  poor appetite, little motivation  PMH:  Past Medical History  Diagnosis Date  . Hypertension   . Diabetes mellitus   . Rheumatoid arthritis   . GERD (gastroesophageal reflux disease)     no meds for  . Hx of radiation therapy 12/16/13-01/30/14    lung 66Gy  . Cancer     LUNG     PSH: Past Surgical History  Procedure Laterality Date  . Cholecystectomy    . Abdominal hysterectomy    . Dilation and curettage of uterus    . Abdominal surgery     I have reviewed the FH and SH and  If appropriate update it with new information. Allergies  Allergen Reactions  . Remicade [Infliximab] Anaphylaxis  . Carboplatin    Scheduled Meds: Continuous Infusions: PRN Meds:.      There were no vitals taken for this visit.   PPS:  70%  No intake or output data in the 24 hours ending 08/04/14 1019  Physical Exam:  General: chronically ill appearing, NAD HEENT:  Moist buccal membranes, no exudatre Ext: without edema Neuro: moves all four extremities  Psych: Good insight into her disease process and choices, tearful at times during discussion regarding "the future"  Labs: CBC    Component Value Date/Time   WBC 8.2 07/28/2014 1326   WBC 4.2 07/22/2014 0955   RBC 4.38 07/28/2014 1326   RBC 3.48* 07/22/2014 0955   HGB 13.1 07/28/2014 1326   HGB 10.5* 07/22/2014 0955   HCT 40.6 07/28/2014 1326   HCT 31.2* 07/22/2014 0955   PLT 328 07/28/2014 1326   PLT 184 07/22/2014 0955   MCV 92.7 07/28/2014 1326   MCV 89.7 07/22/2014 0955   MCH 29.8 07/28/2014 1326   MCH 30.2 07/22/2014 0955   MCHC 32.1 07/28/2014 1326   MCHC 33.7 07/22/2014 0955   RDW 17.0* 07/28/2014 1326   RDW 14.7 07/22/2014 0955   LYMPHSABS 1.1 07/28/2014  1326   MONOABS 0.8 07/28/2014 1326   EOSABS 0.4 07/28/2014 1326   BASOSABS 0.1 07/28/2014 1326    BMET    Component Value Date/Time   NA 138 07/28/2014 1326   NA 136* 07/22/2014 0955   K 4.1 07/28/2014 1326   K 4.4 07/22/2014 0955   CL 103 07/22/2014 0955   CO2 23 07/28/2014 1326   CO2 20 07/22/2014 0955   GLUCOSE 106 07/28/2014 1326   GLUCOSE 152* 07/22/2014 0955   BUN 22.6 07/28/2014 1326   BUN 10 07/22/2014 0955   CREATININE 1.2* 07/28/2014 1326   CREATININE 0.67 07/22/2014 0955   CALCIUM 9.7 07/28/2014 1326   CALCIUM 8.7 07/22/2014 0955   GFRNONAA 86* 07/22/2014 0955   GFRAA >90 07/22/2014 0955    CMP     Component Value Date/Time   NA 138 07/28/2014 1326   NA 136* 07/22/2014 0955   K 4.1 07/28/2014 1326   K 4.4 07/22/2014 0955   CL 103 07/22/2014 0955   CO2 23 07/28/2014 1326   CO2 20 07/22/2014 0955   GLUCOSE 106 07/28/2014 1326   GLUCOSE 152* 07/22/2014 0955   BUN 22.6 07/28/2014 1326   BUN 10 07/22/2014 0955   CREATININE 1.2* 07/28/2014 1326   CREATININE 0.67 07/22/2014 0955   CALCIUM 9.7 07/28/2014 1326   CALCIUM 8.7 07/22/2014 0955   PROT 6.9 07/28/2014 1326   PROT 6.1 07/22/2014 0955   ALBUMIN 3.4* 07/28/2014 1326   ALBUMIN 2.9* 07/22/2014 0955   AST 20 07/28/2014 1326   AST 19 07/22/2014  0955   ALT 23 07/28/2014 1326   ALT 24 07/22/2014 0955   ALKPHOS 108 07/28/2014 1326   ALKPHOS 100 07/22/2014 0955   BILITOT 0.56 07/28/2014 1326   BILITOT 0.5 07/22/2014 0955   GFRNONAA 86* 07/22/2014 0955   GFRAA >90 07/22/2014 0955   ECOG PERFORMANCE STATUS* (Eastern Cooperative Oncology Group)  0 Fully active, able to continue with all pre-disease activities without restriction. Pt score  1 Restricted in physically strenuous activity but ambulatory and able to carry out work of a light or sedentary nature, e.g., light house work, office work.   2 Ambulatory and capable of all self-care but unable to carry out any work activities. Up and about more than 50% of waking hours.  2  3 Capable of only  limited self-care. Confined to bed or chair more than 50% of waking hours.   4 Completely disabled. Cannot carry on any self-care. Totally confined to bed or chair.   5 Dead.    As published in Am. J. Clin. Oncol.: Eustace Pen, M.M., Colon Flattery., Archer, D.C., Horton, Sharen Hint., Drexel Iha, P.P.: Toxicity And Response Criteria Of The [facility]. [place] 5:462-703, 1982.  The ECOG Performance Status is in the public domain therefore available for public use. To duplicate the scale, please cite the reference above and credit the [facility], Tyler Pita M.D., Group Chair   Time In Time Out Total Time Spent with Patient Total Overall Time  0845 1000 70 min 75 min    Greater than 50%  of this time was spent counseling and coordinating care related to the above assessment and plan.   Megan Lessen NP  Palliative Medicine Team Team Phone # [phone] Pager [phone]  Discussed with Dr Purnell Shoemaker

## 2014-08-05 ENCOUNTER — Telehealth: Payer: Self-pay | Admitting: Internal Medicine

## 2014-08-05 ENCOUNTER — Encounter (HOSPITAL_COMMUNITY): Payer: Medicare Other

## 2014-08-05 NOTE — Telephone Encounter (Signed)
Megan [name]  Please find out if patient did followup bmet after 07/28/14 at onc or pcp office. IF so, what is result? If not, please ask her when her next chemo is   Thanks  Dr. Brand Males, M.D., [facility].C.P Pulmonary and Critical Care Medicine Staff Physician [place] [facility] Pager: [phone], If no answer or between  15:00h - 7:00h: call [phone]  08/05/2014 10:37 AM

## 2014-08-06 NOTE — Telephone Encounter (Signed)
Ok if she is feeling ok please let her know that before the give chemo to make sure kidney function is stable on 08/11/14 and she needs blood draw on that day  Thanks  Dr. Brand Males, M.D., [facility].C.P Pulmonary and Critical Care Medicine Staff Physician [place] [facility] Pager: [phone], If no answer or between  15:00h - 7:00h: call [phone]  08/06/2014 1:03 PM

## 2014-08-06 NOTE — Telephone Encounter (Signed)
Called and spoke to pt. Pt denied having any lab work since 9/28. Pt has CMP on 9/28 in Epic. Pt states her next chemo is 10/12.   Will forward to MR to make aware.

## 2014-08-07 ENCOUNTER — Encounter (HOSPITAL_COMMUNITY): Payer: Medicare Other

## 2014-08-11 ENCOUNTER — Ambulatory Visit (HOSPITAL_BASED_OUTPATIENT_CLINIC_OR_DEPARTMENT_OTHER): Payer: Medicare Other

## 2014-08-11 ENCOUNTER — Other Ambulatory Visit: Payer: Medicare Other

## 2014-08-11 ENCOUNTER — Ambulatory Visit (HOSPITAL_BASED_OUTPATIENT_CLINIC_OR_DEPARTMENT_OTHER): Payer: Medicare Other | Admitting: Internal Medicine

## 2014-08-11 ENCOUNTER — Other Ambulatory Visit (HOSPITAL_BASED_OUTPATIENT_CLINIC_OR_DEPARTMENT_OTHER): Payer: Medicare Other

## 2014-08-11 ENCOUNTER — Encounter: Payer: Self-pay | Admitting: Internal Medicine

## 2014-08-11 VITALS — BP 162/92 | HR 101 | Temp 97.7°F | Resp 18 | Ht 66.0 in | Wt 142.7 lb

## 2014-08-11 DIAGNOSIS — Z5112 Encounter for antineoplastic immunotherapy: Secondary | ICD-10-CM

## 2014-08-11 DIAGNOSIS — G629 Polyneuropathy, unspecified: Secondary | ICD-10-CM

## 2014-08-11 DIAGNOSIS — C342 Malignant neoplasm of middle lobe, bronchus or lung: Secondary | ICD-10-CM

## 2014-08-11 DIAGNOSIS — C3491 Malignant neoplasm of unspecified part of right bronchus or lung: Secondary | ICD-10-CM

## 2014-08-11 LAB — CBC WITH DIFFERENTIAL/PLATELET
BASO%: 1.2 % (ref 0.0–2.0)
Basophils Absolute: 0.1 10*3/uL (ref 0.0–0.1)
EOS%: 6.5 % (ref 0.0–7.0)
Eosinophils Absolute: 0.5 10*3/uL (ref 0.0–0.5)
HEMATOCRIT: 38.2 % (ref 34.8–46.6)
HGB: 12.4 g/dL (ref 11.6–15.9)
LYMPH#: 0.8 10*3/uL — AB (ref 0.9–3.3)
LYMPH%: 10.7 % — ABNORMAL LOW (ref 14.0–49.7)
MCH: 29.7 pg (ref 25.1–34.0)
MCHC: 32.5 g/dL (ref 31.5–36.0)
MCV: 91.2 fL (ref 79.5–101.0)
MONO#: 0.6 10*3/uL (ref 0.1–0.9)
MONO%: 8.3 % (ref 0.0–14.0)
NEUT#: 5.4 10*3/uL (ref 1.5–6.5)
NEUT%: 73.3 % (ref 38.4–76.8)
Platelets: 181 10*3/uL (ref 145–400)
RBC: 4.18 10*6/uL (ref 3.70–5.45)
RDW: 17.3 % — ABNORMAL HIGH (ref 11.2–14.5)
WBC: 7.4 10*3/uL (ref 3.9–10.3)

## 2014-08-11 LAB — COMPREHENSIVE METABOLIC PANEL (CC13)
ALBUMIN: 3.4 g/dL — AB (ref 3.5–5.0)
ALT: 25 U/L (ref 0–55)
ANION GAP: 10 meq/L (ref 3–11)
AST: 19 U/L (ref 5–34)
Alkaline Phosphatase: 100 U/L (ref 40–150)
BUN: 16.2 mg/dL (ref 7.0–26.0)
CALCIUM: 9.5 mg/dL (ref 8.4–10.4)
CHLORIDE: 107 meq/L (ref 98–109)
CO2: 21 mEq/L — ABNORMAL LOW (ref 22–29)
CREATININE: 0.8 mg/dL (ref 0.6–1.1)
Glucose: 202 mg/dl — ABNORMAL HIGH (ref 70–140)
POTASSIUM: 4.1 meq/L (ref 3.5–5.1)
Sodium: 139 mEq/L (ref 136–145)
Total Bilirubin: 0.38 mg/dL (ref 0.20–1.20)
Total Protein: 6.3 g/dL — ABNORMAL LOW (ref 6.4–8.3)

## 2014-08-11 MED ORDER — SODIUM CHLORIDE 0.9 % IV SOLN
Freq: Once | INTRAVENOUS | Status: AC
  Administered 2014-08-11: 11:00:00 via INTRAVENOUS

## 2014-08-11 MED ORDER — OXYCODONE-ACETAMINOPHEN 10-325 MG PO TABS: 1.0000 | ORAL_TABLET | ORAL | Status: DC | PRN

## 2014-08-11 MED ORDER — SODIUM CHLORIDE 0.9 % IV SOLN
3.1000 mg/kg | Freq: Once | INTRAVENOUS | Status: AC
  Administered 2014-08-11: 200 mg via INTRAVENOUS
  Filled 2014-08-11: qty 20

## 2014-08-11 NOTE — Progress Notes (Signed)
[place] [phone]   [phone]  OFFICE PROGRESS NOTE  [name], MD [address] [place] [address]  DIAGNOSIS: Squamous cell lung cancer  Primary site: Lung (Right)  Staging method: AJCC 7th Edition  Clinical: Stage IIIB (T3, N3, M0)  Summary: Stage IIIB (T3, N3, M0)   PRIOR THERAPY:  1) Concurrent chemoradiation with weekly chemotherapy in the form of carboplatin for AUC of 2 and paclitaxel 45 mg/m2. Status post 7 week of treatment. Last dose was given 01/27/2014 no significant response in her disease.  2) Consolidation chemotherapy with carboplatin for AUC of 5 and paclitaxel 175 mg/M2 every 3 weeks with Neulasta support. First dose on 04/15/2014. Status post 3 cycles. Carboplatin was discontinued starting cycle #2 secondary to hypersensitivity reaction.  CURRENT THERAPY: Immunotherapy with Opdivo (Nivolumab) 3mg /kg given every 2 weeks. Status post 3 cycles.  CHEMOTHERAPY INTENT: Control/Palliative  CURRENT # OF CHEMOTHERAPY CYCLES: 4 CURRENT ANTIEMETICS: Zofran, dexamethasone, Compazine  CURRENT SMOKING STATUS: Former smoker, quit 12/05/1997  ORAL CHEMOTHERAPY AND CONSENT: n/a  CURRENT BISPHOSPHONATES USE: None  PAIN MANAGEMENT: no cancer related pain, patient does have RA  NARCOTICS INDUCED CONSTIPATION: none  LIVING WILL AND CODE STATUS: ?   INTERVAL HISTORY: Megan [name] 73 y.o. female returns to the clinic today for followup visit accompanied by a friend. She continues to complain of increasing fatigue and weakness as well as peripheral neuropathy. She was tried on several medications including lytic which she discontinued in her on as well as Cymbalta which also did not tolerate and in the process of tapering her dose down to discontinue it. She is tolerating her current chemotherapy fairly well with no significant adverse effects. The patient denied having any significant weight loss or night  sweats. She has no nausea or vomiting. She has no fever or chills. She denied having any significant chest pain but continues to have shortness breath with exertion with no cough or hemoptysis. She is here today to start cycle #4 of her treatment.  MEDICAL HISTORY: Past Medical History  Diagnosis Date  . Hypertension   . Diabetes mellitus   . Rheumatoid arthritis   . GERD (gastroesophageal reflux disease)     no meds for  . Hx of radiation therapy 12/16/13-01/30/14    lung 66Gy  . Cancer     LUNG    ALLERGIES:  is allergic to remicade; carboplatin; and hydromorphone.  MEDICATIONS:  Current Outpatient Prescriptions  Medication Sig Dispense Refill  . Biotin 5000 MCG CAPS Take 1 capsule by mouth daily.       [name] docusate sodium (COLACE) 100 MG capsule Take 1 capsule (100 mg total) by mouth every 12 (twelve) hours.  60 capsule  0  . DULoxetine (CYMBALTA) 30 MG capsule Take 1 capsule (30 mg total) by mouth daily.  40 capsule  3  . Fluticasone-Salmeterol (ADVAIR) 100-50 MCG/DOSE AEPB Inhale 1 puff into the lungs 2 (two) times daily.      [name] ibuprofen (ADVIL,MOTRIN) 400 MG tablet Take 1 tablet (400 mg total) by mouth every 6 (six) hours as needed. Take with food.  30 tablet  0  . meloxicam (MOBIC) 15 MG tablet Take 15 mg by mouth daily.      [name] oxyCODONE-acetaminophen (PERCOCET) 5-325 MG per tablet Take 1 tablet by mouth every 6 (six) hours as needed for moderate pain.  20 tablet  0  . prochlorperazine (COMPAZINE) 10 MG tablet Take 10 mg by  mouth every 6 (six) hours as needed for nausea or vomiting.      . vitamin B-12 (CYANOCOBALAMIN) 100 MCG tablet Take 100 mcg by mouth daily.       No current facility-administered medications for this visit.   Facility-Administered Medications Ordered in Other Visits  Medication Dose Route Frequency Provider Last Rate Last Dose  . sodium chloride 0.9 % injection 10 mL  10 mL Intracatheter PRN Curt Bears, MD        SURGICAL HISTORY:  Past Surgical  History  Procedure Laterality Date  . Cholecystectomy    . Abdominal hysterectomy    . Dilation and curettage of uterus    . Abdominal surgery      REVIEW OF SYSTEMS:  Constitutional: negative Eyes: negative Ears, nose, mouth, throat, and face: negative Respiratory: negative Cardiovascular: negative Gastrointestinal: positive for dyspepsia Genitourinary:negative Integument/breast: negative Hematologic/lymphatic: negative Musculoskeletal:negative Neurological: negative Behavioral/Psych: negative Endocrine: negative Allergic/Immunologic: negative   PHYSICAL EXAMINATION: General appearance: alert, cooperative and no distress Head: Normocephalic, without obvious abnormality, atraumatic Neck: no adenopathy, no JVD, supple, symmetrical, trachea midline and thyroid not enlarged, symmetric, no tenderness/mass/nodules Lymph nodes: Cervical, supraclavicular, and axillary nodes normal. Resp: clear to auscultation bilaterally Back: symmetric, no curvature. ROM normal. No CVA tenderness. Cardio: regular rate and rhythm, S1, S2 normal, no murmur, click, rub or gallop GI: soft, non-tender; bowel sounds normal; no masses,  no organomegaly Extremities: extremities normal, atraumatic, no cyanosis or edema Neurologic: Alert and oriented X 3, normal strength and tone. Normal symmetric reflexes. Normal coordination and gait  ECOG PERFORMANCE STATUS: 1 - Symptomatic but completely ambulatory  Blood pressure 162/92, pulse 101, temperature 97.7 F (36.5 C), temperature source Oral, resp. rate 18, height 5\' 6"  (1.676 m), weight 142 lb 11.2 oz (64.728 kg).  LABORATORY DATA: Lab Results  Component Value Date   WBC 7.4 08/11/2014   HGB 12.4 08/11/2014   HCT 38.2 08/11/2014   MCV 91.2 08/11/2014   PLT 181 08/11/2014      Chemistry      Component Value Date/Time   NA 139 08/11/2014 0910   NA 136* 07/22/2014 0955   K 4.1 08/11/2014 0910   K 4.4 07/22/2014 0955   CL 103 07/22/2014 0955   CO2  21* 08/11/2014 0910   CO2 20 07/22/2014 0955   BUN 16.2 08/11/2014 0910   BUN 10 07/22/2014 0955   CREATININE 0.8 08/11/2014 0910   CREATININE 0.67 07/22/2014 0955      Component Value Date/Time   CALCIUM 9.5 08/11/2014 0910   CALCIUM 8.7 07/22/2014 0955   ALKPHOS 100 08/11/2014 0910   ALKPHOS 100 07/22/2014 0955   AST 19 08/11/2014 0910   AST 19 07/22/2014 0955   ALT 25 08/11/2014 0910   ALT 24 07/22/2014 0955   BILITOT 0.38 08/11/2014 0910   BILITOT 0.5 07/22/2014 0955       RADIOGRAPHIC STUDIES:  ASSESSMENT AND PLAN: This is a very pleasant 73 years old white female with history of stage IIIB non-small cell lung cancer status post a course of concurrent chemoradiation with weekly carboplatin and paclitaxel status post 7 cycles. The patient tolerated her treatment fairly well with no significant adverse effects except for the radiation induced esophagitis.  She was also treated with 3 cycles of consolidation chemotherapy with carboplatin and paclitaxel but carboplatin was discontinued the start of cycle #2 secondary to hypersensitivity reaction. Her restaging scan showed evidence for disease progression and the patient is currently undergoing immunotherapy with Nivolumab 3 mg/KG  every 2 weeks is status post 3 cycles. I recommended for her to proceed with cycle #4 today as scheduled. For the peripheral neuropathy, I will refer her to [facility] neurology for evaluation. I also increased her pain medication to Percocet 10/325 mg by mouth every 6 hours as needed. She would come back for followup visit in 2 weeks for reevaluation with repeat CT scan of the chest for restaging of her disease. She was advised to call immediately if she has any concerning symptoms in the interval. The patient voices understanding of current disease status and treatment options and is in agreement with the current care plan.  All questions were answered. The patient knows to call the clinic with any problems,  questions or concerns. We can certainly see the patient much sooner if necessary. I spent 15 minutes of face-to-face counseling with the patient and her friend out of the total visit time 25 minutes.  Disclaimer: This note was dictated with voice recognition software. Similar sounding words can inadvertently be transcribed and may not be corrected upon review.

## 2014-08-11 NOTE — Addendum Note (Signed)
Addended by: Curt Bears on: 08/11/2014 06:24 PM   Modules accepted: Orders, Medications

## 2014-08-11 NOTE — Patient Instructions (Signed)
[facility] Discharge Instructions for Patients Receiving Chemotherapy  Today you received the following chemotherapy agents Nivolumab.  To help prevent nausea and vomiting after your treatment, we encourage you to take your nausea medication.   If you develop nausea and vomiting that is not controlled by your nausea medication, call the clinic.   BELOW ARE SYMPTOMS THAT SHOULD BE REPORTED IMMEDIATELY:  *FEVER GREATER THAN 100.5 F  *CHILLS WITH OR WITHOUT FEVER  NAUSEA AND VOMITING THAT IS NOT CONTROLLED WITH YOUR NAUSEA MEDICATION  *UNUSUAL SHORTNESS OF BREATH  *UNUSUAL BRUISING OR BLEEDING  TENDERNESS IN MOUTH AND THROAT WITH OR WITHOUT PRESENCE OF ULCERS  *URINARY PROBLEMS  *BOWEL PROBLEMS  UNUSUAL RASH Items with * indicate a potential emergency and should be followed up as soon as possible.  Feel free to call the clinic you have any questions or concerns. The clinic phone number is [phone].

## 2014-08-12 ENCOUNTER — Encounter (HOSPITAL_COMMUNITY): Payer: Medicare Other

## 2014-08-12 NOTE — Telephone Encounter (Signed)
Pt had CBC and CMP drawn on 10/12 along with her chemotherapy. Called and spoke to pt. She confirmed her blood draw and stated she is doing well.   Will forward to MR for FYI. Nothing further needed. Will sign off.

## 2014-08-12 NOTE — Telephone Encounter (Signed)
Creat better. Will close this

## 2014-08-13 ENCOUNTER — Encounter: Payer: Self-pay | Admitting: Neurology

## 2014-08-13 ENCOUNTER — Ambulatory Visit (INDEPENDENT_AMBULATORY_CARE_PROVIDER_SITE_OTHER): Payer: Medicare Other | Admitting: Neurology

## 2014-08-13 ENCOUNTER — Telehealth: Payer: Self-pay | Admitting: Internal Medicine

## 2014-08-13 VITALS — BP 171/88 | HR 95 | Ht 66.0 in | Wt 142.0 lb

## 2014-08-13 DIAGNOSIS — T451X5A Adverse effect of antineoplastic and immunosuppressive drugs, initial encounter: Principal | ICD-10-CM

## 2014-08-13 DIAGNOSIS — G620 Drug-induced polyneuropathy: Secondary | ICD-10-CM

## 2014-08-13 DIAGNOSIS — G622 Polyneuropathy due to other toxic agents: Secondary | ICD-10-CM

## 2014-08-13 MED ORDER — SUVOREXANT 20 MG PO TABS: 20.0000 mg | ORAL_TABLET | Freq: Every evening | ORAL | Status: DC | PRN

## 2014-08-13 MED ORDER — GABAPENTIN (ONCE-DAILY) 300 & 600 MG PO MISC: 1800.0000 mg | Freq: Every day | ORAL | Status: DC

## 2014-08-13 MED ORDER — LIDOCAINE 5 % EX PTCH: 3.0000 | MEDICATED_PATCH | CUTANEOUS | Status: DC

## 2014-08-13 NOTE — Progress Notes (Addendum)
[facility]    Provider:  Dr Jaynee Eagles Referring Provider: Anda Kraft, MD Primary Care Physician:  [name], MD  CC:  neuropathy  HPI:  Megan [name] is a 73 y.o. female here as a referral from Dr. Wilson Singer for neuropathy  This is a very pleasant 73 years old white female with history of stage IIIB non-small cell lung cancer status postcarboplatin and paclitaxel. Patient reports she had chemotherapy and radiation from [facility] - April 2nd.  6 weeks later started a second set of chemo, 3 sessions every 3 weeks. The neuropathy started about 3 weeks ago. Feels like burning, pins and needles and lightning strikes. Feet and left hand > right hand. It is in the entire feet to the ankles and entire left hand and digits 3-5 right hand. Severe, cramping in the feet and calfs. She is crying in the office today. Oxycodone not helping with the pain, doesn't even touch it. Didn't try Lyrica. Cymbalta worsened the symptoms. Nothing is helping.  She is using a cane. No falls. Balance if off. The pain is severe, 10/10 and keeps her from sleeping at night. Symptoms are continuous and never subside, but are worse at night.  Reviewed notes, labs and imaging from outside physicians, which showed: 1) Concurrent chemoradiation with weekly chemotherapy in the form of carboplatin for AUC of 2 and paclitaxel 45 mg/m2. Status post 7 week of treatment. Last dose was given 01/27/2014 no significant response in her disease.  2) Consolidation chemotherapy with carboplatin for AUC of 5 and paclitaxel 175 mg/M2 every 3 weeks with Neulasta support. First dose on 04/15/2014. Status post 3 cycles. Carboplatin was discontinued starting cycle #2 secondary to hypersensitivity reaction.  Her restaging scan showed evidence for disease progression and the patient is currently undergoing immunotherapy with Nivolumab 3 mg/KG every 2 weeks is status post 3 cycles.    Review of Systems: Patient  complains of symptoms per HPI as well as the following symptoms chills, fatigue, sweating, hearing loss, ringing in ears, SOB, constipation, numbness, weakness, aching muscles, muscle cramps, walking difficulty. Pertinent negatives per HPI. All others negative.   History   Social History  . Marital Status: Divorced    Spouse Name: N/A    Number of Children: 0  . Years of Education: MA   Occupational History  . Retired Other   Social History Main Topics  . Smoking status: Former Smoker -- 3.00 packs/day for 45 years    Types: Cigarettes    Quit date: 12/05/1997  . Smokeless tobacco: Never Used  . Alcohol Use: Yes     Comment: occasional  . Drug Use: No  . Sexual Activity: Not Currently   Other Topics Concern  . Not on file   Social History Narrative   Patient lives at home alone.   Caffeine Use: 2 cups daily    Family History  Problem Relation Age of Onset  . Hypertension Other   . Stroke Other   . Heart attack Other   . Hemachromatosis Other   . Rheum arthritis      both sets of grandparents and father  . Other Mother   . Other Father     failure to thrive    Past Medical History  Diagnosis Date  . Hypertension   . Diabetes mellitus   . Rheumatoid arthritis   . GERD (gastroesophageal reflux disease)     no meds for  . Hx of radiation therapy 12/16/13-01/30/14    lung  66Gy  . Cancer     LUNG    Past Surgical History  Procedure Laterality Date  . Cholecystectomy    . Abdominal hysterectomy    . Dilation and curettage of uterus    . Abdominal surgery      Current Outpatient Prescriptions  Medication Sig Dispense Refill  . Biotin 5000 MCG CAPS Take 1 capsule by mouth daily.       [name] docusate sodium (COLACE) 100 MG capsule Take 1 capsule (100 mg total) by mouth every 12 (twelve) hours.  60 capsule  0  . DULoxetine (CYMBALTA) 30 MG capsule Take 1 capsule (30 mg total) by mouth daily.  40 capsule  3  . Fluticasone-Salmeterol (ADVAIR) 100-50 MCG/DOSE AEPB  Inhale 1 puff into the lungs 2 (two) times daily.      [name] ibuprofen (ADVIL,MOTRIN) 400 MG tablet Take 1 tablet (400 mg total) by mouth every 6 (six) hours as needed. Take with food.  30 tablet  0  . losartan-hydrochlorothiazide (HYZAAR) 100-25 MG per tablet Take 1 tablet by mouth daily.      . meloxicam (MOBIC) 15 MG tablet Take 15 mg by mouth daily.      [name] oxyCODONE-acetaminophen (PERCOCET) 10-325 MG per tablet Take 1 tablet by mouth every 4 (four) hours as needed for pain.  30 tablet  0  . prochlorperazine (COMPAZINE) 10 MG tablet Take 10 mg by mouth every 6 (six) hours as needed for nausea or vomiting.      . vitamin B-12 (CYANOCOBALAMIN) 100 MCG tablet Take 100 mcg by mouth daily.       No current facility-administered medications for this visit.   Facility-Administered Medications Ordered in Other Visits  Medication Dose Route Frequency Provider Last Rate Last Dose  . sodium chloride 0.9 % injection 10 mL  10 mL Intracatheter PRN Curt Bears, MD        Allergies as of 08/13/2014 - Review Complete 08/13/2014  Allergen Reaction Noted  . Remicade [infliximab] Anaphylaxis 05/13/2012  . Carboplatin  05/29/2014  . Hydromorphone Rash 08/11/2014    Vitals: BP 171/88  Pulse 95  Ht 5\' 6"  (1.676 m)  Wt 142 lb (64.411 kg)  BMI 22.93 kg/m2 Last Weight:  Wt Readings from Last 1 Encounters:  08/13/14 142 lb (64.411 kg)   Last Height:   Ht Readings from Last 1 Encounters:  08/13/14 5\' 6"  (1.676 m)   Physical exam: Exam: Gen: crying, conversant, thin, well groomed                     CV: RRR, no MRG. No Carotid Bruits. No peripheral edema, warm, nontender Eyes: Conjunctivae clear without exudates or hemorrhage  Neuro: Detailed Neurologic Exam  Speech:    Speech is normal; fluent and spontaneous with normal comprehension.  Cognition:    The patient is oriented to person, place, and time;     recent and remote memory intact;     language fluent;     normal attention,  concentration,    fund of knowledge Cranial Nerves:    The pupils are equal, round, and reactive to light. The fundi are normal and spontaneous venous pulsations are present. Visual fields are full to finger confrontation. Extraocular movements are intact. Trigeminal sensation is intact and the muscles of mastication are normal. The face is symmetric. The palate elevates in the midline. Voice is normal. Shoulder shrug is normal. The tongue has normal motion without fasciculations.   Coordination:  Normal finger to nose and heel to shin.   Gait:    Ataxic, wide based, using a cane  Motor Observation:    Distal atrophy most significant in the FDI  Tone:    Normal muscle tone.    Posture:    Posture is normal. normal erect    Strength:    Mild weakness in the distal extremities. Strength is V/V in the upper and lower limbs.     Sensation: Absent vibration and proprioception in the distal extremities. Impaired pp and temp in the extremities. Allodynia bottom of the feet. +Romberg.     Reflex Exam: Absent ankle jerks   Toes:    The toes are downgoing bilaterally.   Clonus:    Clonus is absent.  Assessment/Plan:  This is a very pleasant 73 years old white female with history of stage IIIB non-small cell lung cancer status postcarboplatin and paclitaxel with resultant severe neuropathy. Exam shows severe loss of sensation in the distal extremities, +Romberg, Ataxic gait and Allodynia. Very distressing to patient, she is crying in the office. Keeps her up at night with pain. Cymbalta did not help. There is little evidence for particular agents that are more effective for treating neuropathy caused by chemotherapy. At this point just need to try different medications and somehow try to make patient comfortable. Will start with Gabapentin extended release and slowly titrate to 1800mg  as tolerated. If this does not help will try other agents. Will also give lidocaine patches to wear on feet at  night. Also provided Belsomra to see if this can help patient get to sleep. Discussed common side effects. Return to clinic in 3 months but patient will call with updates and we can discuss over the phone and try different things.  Sarina Ill, MD  [facility] [address] [place], [place] [address]  Phone [phone] Fax [phone] Lenor Coffin

## 2014-08-13 NOTE — Addendum Note (Signed)
Addended by: Sarina Ill B on: 08/13/2014 08:14 PM   Modules accepted: Orders

## 2014-08-13 NOTE — Telephone Encounter (Signed)
s.w. pt regarding to OCT appt....pt ok and aware

## 2014-08-14 ENCOUNTER — Telehealth: Payer: Self-pay | Admitting: Neurology

## 2014-08-14 ENCOUNTER — Encounter (HOSPITAL_COMMUNITY): Payer: Medicare Other

## 2014-08-14 NOTE — Telephone Encounter (Signed)
I called back.  Spoke with the patient.  She would like to stay on this med if possible.  Ins will be contacted to see if they will cover this medication.  They will notify patient of outcome once a decision has been made.

## 2014-08-14 NOTE — Telephone Encounter (Signed)
Patient stated Lidocaine patches cost over 800.00 at pharmacy.  Questioning if there's an alternative.  Please call and advise

## 2014-08-18 ENCOUNTER — Other Ambulatory Visit: Payer: Self-pay | Admitting: Medical Oncology

## 2014-08-18 ENCOUNTER — Telehealth: Payer: Self-pay | Admitting: Internal Medicine

## 2014-08-18 DIAGNOSIS — C3490 Malignant neoplasm of unspecified part of unspecified bronchus or lung: Secondary | ICD-10-CM

## 2014-08-18 DIAGNOSIS — R5383 Other fatigue: Secondary | ICD-10-CM

## 2014-08-18 NOTE — Telephone Encounter (Signed)
Called spoke with pt. She reports she made an appt with cardiology and then cancelled the appt for elevated HR. She reports she wants to leave this up to MR and Dr. Julien Nordmann.  She reports she has read that there are many things that can cause the heart rate to be elevated. Please advise thanks

## 2014-08-19 ENCOUNTER — Other Ambulatory Visit: Payer: Self-pay | Admitting: Neurology

## 2014-08-19 ENCOUNTER — Other Ambulatory Visit (HOSPITAL_BASED_OUTPATIENT_CLINIC_OR_DEPARTMENT_OTHER): Payer: Medicare Other

## 2014-08-19 ENCOUNTER — Ambulatory Visit (HOSPITAL_COMMUNITY)
Admission: RE | Admit: 2014-08-19 | Discharge: 2014-08-19 | Disposition: A | Discharge status: Home / Self Care | Payer: Medicare Other | Source: Ambulatory Visit | Attending: Internal Medicine | Admitting: Internal Medicine

## 2014-08-19 ENCOUNTER — Encounter (HOSPITAL_COMMUNITY): Payer: Medicare Other

## 2014-08-19 ENCOUNTER — Encounter (HOSPITAL_COMMUNITY): Payer: Self-pay

## 2014-08-19 DIAGNOSIS — Z9221 Personal history of antineoplastic chemotherapy: Secondary | ICD-10-CM | POA: Insufficient documentation

## 2014-08-19 DIAGNOSIS — C3491 Malignant neoplasm of unspecified part of right bronchus or lung: Secondary | ICD-10-CM | POA: Insufficient documentation

## 2014-08-19 DIAGNOSIS — C342 Malignant neoplasm of middle lobe, bronchus or lung: Secondary | ICD-10-CM

## 2014-08-19 DIAGNOSIS — C3490 Malignant neoplasm of unspecified part of unspecified bronchus or lung: Secondary | ICD-10-CM

## 2014-08-19 DIAGNOSIS — R5383 Other fatigue: Secondary | ICD-10-CM

## 2014-08-19 DIAGNOSIS — R05 Cough: Secondary | ICD-10-CM | POA: Diagnosis present

## 2014-08-19 DIAGNOSIS — Z79899 Other long term (current) drug therapy: Secondary | ICD-10-CM

## 2014-08-19 LAB — TSH CHCC: TSH: 0.787 m[IU]/L (ref 0.308–3.960)

## 2014-08-19 MED ORDER — SUVOREXANT 20 MG PO TABS: 20.0000 mg | ORAL_TABLET | Freq: Every evening | ORAL | Status: DC | PRN

## 2014-08-19 MED ORDER — IOHEXOL 300 MG/ML  SOLN
80.0000 mL | Freq: Once | INTRAMUSCULAR | Status: AC | PRN
  Administered 2014-08-19: 80 mL via INTRAVENOUS

## 2014-08-19 NOTE — Telephone Encounter (Signed)
Patient would like to proceed with getting Rx for Belsomra.  Request entered, forwarded to provider for approval.  Thank you.

## 2014-08-19 NOTE — Telephone Encounter (Signed)
Patient requesting script for Belsomra to be sent to the [facility].

## 2014-08-20 NOTE — Telephone Encounter (Signed)
She is right many things can elevate HR. When she comes in next I will do EKG. Till then hold off seeing cards unless something changes. She can always call us anytime with concerns  Thanks  Dr. Brand Males, M.D., [facility].C.P Pulmonary and Critical Care Medicine Staff Physician [facility] Pager: [phone], If no answer or between  15:00h - 7:00h: call [phone]  08/20/2014 11:21 AM

## 2014-08-20 NOTE — Telephone Encounter (Signed)
Rx signed and faxed.

## 2014-08-20 NOTE — Telephone Encounter (Signed)
Pt aware of rec's per MR Will keep f/u with our office. Pt has cancelled Cardiology appt.  Nothing further needed.

## 2014-08-21 ENCOUNTER — Encounter (HOSPITAL_COMMUNITY): Payer: Medicare Other

## 2014-08-21 ENCOUNTER — Telehealth: Payer: Self-pay | Admitting: Internal Medicine

## 2014-08-21 ENCOUNTER — Institutional Professional Consult (permissible substitution): Payer: Medicare Other | Admitting: Cardiology

## 2014-08-21 ENCOUNTER — Telehealth: Payer: Self-pay | Admitting: *Deleted

## 2014-08-21 ENCOUNTER — Ambulatory Visit (HOSPITAL_BASED_OUTPATIENT_CLINIC_OR_DEPARTMENT_OTHER): Payer: Medicare Other | Admitting: Internal Medicine

## 2014-08-21 ENCOUNTER — Encounter: Payer: Self-pay | Admitting: Internal Medicine

## 2014-08-21 VITALS — BP 104/67 | HR 109 | Temp 98.0°F | Resp 18 | Ht 66.0 in | Wt 142.7 lb

## 2014-08-21 DIAGNOSIS — M792 Neuralgia and neuritis, unspecified: Secondary | ICD-10-CM | POA: Insufficient documentation

## 2014-08-21 DIAGNOSIS — G893 Neoplasm related pain (acute) (chronic): Secondary | ICD-10-CM

## 2014-08-21 DIAGNOSIS — Z515 Encounter for palliative care: Secondary | ICD-10-CM | POA: Insufficient documentation

## 2014-08-21 DIAGNOSIS — R531 Weakness: Secondary | ICD-10-CM

## 2014-08-21 DIAGNOSIS — Z7189 Other specified counseling: Secondary | ICD-10-CM | POA: Insufficient documentation

## 2014-08-21 DIAGNOSIS — G629 Polyneuropathy, unspecified: Secondary | ICD-10-CM

## 2014-08-21 DIAGNOSIS — R5383 Other fatigue: Secondary | ICD-10-CM

## 2014-08-21 DIAGNOSIS — R5382 Chronic fatigue, unspecified: Secondary | ICD-10-CM

## 2014-08-21 DIAGNOSIS — T451X5A Adverse effect of antineoplastic and immunosuppressive drugs, initial encounter: Secondary | ICD-10-CM

## 2014-08-21 DIAGNOSIS — C342 Malignant neoplasm of middle lobe, bronchus or lung: Secondary | ICD-10-CM

## 2014-08-21 DIAGNOSIS — G620 Drug-induced polyneuropathy: Secondary | ICD-10-CM

## 2014-08-21 MED ORDER — IBUPROFEN 600 MG PO TABS: 600.0000 mg | ORAL_TABLET | Freq: Four times a day (QID) | ORAL | Status: DC | PRN

## 2014-08-21 NOTE — Telephone Encounter (Signed)
Per staff phone call and POF I have schedueld appts. Scheduler advised of appts.  JMW  

## 2014-08-21 NOTE — Telephone Encounter (Signed)
gv adn printed appt sched and avs for pt for NOV...sed added

## 2014-08-21 NOTE — Telephone Encounter (Signed)
gv and printed appt sched and avs for opt for NOV...sed added tx....emailed MW to add tx.

## 2014-08-21 NOTE — Progress Notes (Signed)
[place] [phone]   [phone]  OFFICE PROGRESS NOTE  [name], MD [address] [place] [address] [place] [address]  DIAGNOSIS: Squamous cell lung cancer  Primary site: Lung (Right)  Staging method: AJCC 7th Edition  Clinical: Stage IIIB (T3, N3, M0)  Summary: Stage IIIB (T3, N3, M0)   PRIOR THERAPY:  1) Concurrent chemoradiation with weekly chemotherapy in the form of carboplatin for AUC of 2 and paclitaxel 45 mg/m2. Status post 7 week of treatment. Last dose was given 01/27/2014 no significant response in her disease.  2) Consolidation chemotherapy with carboplatin for AUC of 5 and paclitaxel 175 mg/M2 every 3 weeks with Neulasta support. First dose on 04/15/2014. Status post 3 cycles. Carboplatin was discontinued starting cycle #2 secondary to hypersensitivity reaction.  CURRENT THERAPY: Immunotherapy with Opdivo (Nivolumab) 3mg /kg given every 2 weeks. Status post 4 cycles.  CHEMOTHERAPY INTENT: Control/Palliative  CURRENT # OF CHEMOTHERAPY CYCLES: 5 CURRENT ANTIEMETICS: Zofran, dexamethasone, Compazine  CURRENT SMOKING STATUS: Former smoker, quit 12/05/1997  ORAL CHEMOTHERAPY AND CONSENT: n/a  CURRENT BISPHOSPHONATES USE: None  PAIN MANAGEMENT: no cancer related pain, patient does have RA  NARCOTICS INDUCED CONSTIPATION: none  LIVING WILL AND CODE STATUS: ?   INTERVAL HISTORY: Megan [name] 73 y.o. female returns to the clinic today for followup visit accompanied by a friend. She came today with the same previous complaints including fatigue and weakness as well as peripheral neuropathy. She was tried on Lyrica and Cymbalta. She was unable to tolerate Cymbalta and decided to discontinue it. She felt a little bit better after starting ibuprofen 400 mg by mouth every 6 hours as needed for pain and she would like to increase the dose if possible. She is tolerating her current immunotherapy fairly well with no  significant adverse effects. The patient denied having any significant weight loss or night sweats. She has no nausea or vomiting. She has no fever or chills. She denied having any significant chest pain but continues to have shortness breath with exertion with no cough or hemoptysis. She is here today to start cycle #5 of her treatment. She had repeat CT scan of the chest, abdomen and pelvis performed recently and she is here for evaluation and discussion of her scan results and further treatment options.  MEDICAL HISTORY: Past Medical History  Diagnosis Date  . Hypertension   . Diabetes mellitus   . Rheumatoid arthritis   . GERD (gastroesophageal reflux disease)     no meds for  . Hx of radiation therapy 12/16/13-01/30/14    lung 66Gy  . Cancer     LUNG    ALLERGIES:  is allergic to remicade; carboplatin; and hydromorphone.  MEDICATIONS:  Current Outpatient Prescriptions  Medication Sig Dispense Refill  . Biotin 5000 MCG CAPS Take 1 capsule by mouth daily.       [name] docusate sodium (COLACE) 100 MG capsule Take 1 capsule (100 mg total) by mouth every 12 (twelve) hours.  60 capsule  0  . Gabapentin, PHN, (GRALISE STARTER) 300 & 600 MG MISC Take 1,800 mg by mouth at bedtime.  1 each  1  . ibuprofen (ADVIL,MOTRIN) 400 MG tablet Take 1 tablet (400 mg total) by mouth every 6 (six) hours as needed. Take with food.  30 tablet  0  . lidocaine (LIDODERM) 5 % Place 3 patches onto the skin daily. Remove & Discard patch within 12 hours or as directed by MD. May cut to fit.  90 patch  0  . losartan-hydrochlorothiazide (HYZAAR) 100-25 MG per tablet Take 1 tablet by mouth daily.      . prochlorperazine (COMPAZINE) 10 MG tablet Take 10 mg by mouth every 6 (six) hours as needed for nausea or vomiting.      . vitamin B-12 (CYANOCOBALAMIN) 100 MCG tablet Take 100 mcg by mouth daily.      . meloxicam (MOBIC) 15 MG tablet Take 15 mg by mouth daily.      . Suvorexant (BELSOMRA) 20 MG TABS Take 20 mg by mouth at  bedtime as needed.  30 tablet  3   No current facility-administered medications for this visit.   Facility-Administered Medications Ordered in Other Visits  Medication Dose Route Frequency Provider Last Rate Last Dose  . sodium chloride 0.9 % injection 10 mL  10 mL Intracatheter PRN Curt Bears, MD        SURGICAL HISTORY:  Past Surgical History  Procedure Laterality Date  . Cholecystectomy    . Abdominal hysterectomy    . Dilation and curettage of uterus    . Abdominal surgery      REVIEW OF SYSTEMS:  Constitutional: negative Eyes: negative Ears, nose, mouth, throat, and face: negative Respiratory: negative Cardiovascular: negative Gastrointestinal: positive for dyspepsia Genitourinary:negative Integument/breast: negative Hematologic/lymphatic: negative Musculoskeletal:negative Neurological: negative Behavioral/Psych: negative Endocrine: negative Allergic/Immunologic: negative   PHYSICAL EXAMINATION: General appearance: alert, cooperative and no distress Head: Normocephalic, without obvious abnormality, atraumatic Neck: no adenopathy, no JVD, supple, symmetrical, trachea midline and thyroid not enlarged, symmetric, no tenderness/mass/nodules Lymph nodes: Cervical, supraclavicular, and axillary nodes normal. Resp: clear to auscultation bilaterally Back: symmetric, no curvature. ROM normal. No CVA tenderness. Cardio: regular rate and rhythm, S1, S2 normal, no murmur, click, rub or gallop GI: soft, non-tender; bowel sounds normal; no masses,  no organomegaly Extremities: extremities normal, atraumatic, no cyanosis or edema Neurologic: Alert and oriented X 3, normal strength and tone. Normal symmetric reflexes. Normal coordination and gait  ECOG PERFORMANCE STATUS: 1 - Symptomatic but completely ambulatory  Blood pressure 104/67, pulse 109, temperature 98 F (36.7 C), temperature source Oral, resp. rate 18, height 5\' 6"  (1.676 m), weight 142 lb 11.2 oz (64.728 kg),  SpO2 98.00%.  LABORATORY DATA: Lab Results  Component Value Date   WBC 7.4 08/11/2014   HGB 12.4 08/11/2014   HCT 38.2 08/11/2014   MCV 91.2 08/11/2014   PLT 181 08/11/2014      Chemistry      Component Value Date/Time   NA 139 08/11/2014 0910   NA 136* 07/22/2014 0955   K 4.1 08/11/2014 0910   K 4.4 07/22/2014 0955   CL 103 07/22/2014 0955   CO2 21* 08/11/2014 0910   CO2 20 07/22/2014 0955   BUN 16.2 08/11/2014 0910   BUN 10 07/22/2014 0955   CREATININE 0.8 08/11/2014 0910   CREATININE 0.67 07/22/2014 0955      Component Value Date/Time   CALCIUM 9.5 08/11/2014 0910   CALCIUM 8.7 07/22/2014 0955   ALKPHOS 100 08/11/2014 0910   ALKPHOS 100 07/22/2014 0955   AST 19 08/11/2014 0910   AST 19 07/22/2014 0955   ALT 25 08/11/2014 0910   ALT 24 07/22/2014 0955   BILITOT 0.38 08/11/2014 0910   BILITOT 0.5 07/22/2014 0955       RADIOGRAPHIC STUDIES: Ct Chest W Contrast  08/19/2014   CLINICAL DATA:  Right lung cancer, diagnosed December 2014. Chemotherapy complete. Restaging. Dry cough.  EXAM: CT CHEST WITH CONTRAST  TECHNIQUE: Multidetector  CT imaging of the chest was performed during intravenous contrast administration.  CONTRAST:  68mL OMNIPAQUE IOHEXOL 300 MG/ML  SOLN  COMPARISON:  Chest CT 06/13/2014  FINDINGS: Again noted is the left thyroid nodule, measuring approximately 1.9 cm, stable. Chest wall soft tissues are normal. Heart is normal size. Aorta is normal caliber.  Calcified granuloma in the left lower lobe measures 7 mm. Calcified left hilar lymph nodes.  Again noted is right middle lobe and partial right upper lobe collapse, surrounding the right upper lobe mass. Best estimation of the central low-attenuation masslike area in the region of atelectasis currently measures 3.0 x 1.8 cm compared with 3.8 x 2.1 cm previously. Left hilar adenopathy measures up to 1.4 cm compared with 1.6 cm previously. Abrupt cut off of the right middle lobe bronchus again noted with a stable  appearance. Ground-glass opacification posteriorly in the right lower lobe has increased slightly superiorly where the area of ground-glass density measures up to approximately 3.1 cm in thickness compared to 2.4 cm previously. No confluent opacities or suspicious nodules on the left. No pleural effusions.  No mediastinal or axillary adenopathy. Imaging into the upper abdomen shows no acute findings. Prior cholecystectomy. Low-density areas with in the liver are stable, likely cysts. Calcified granulomas in the spleen.  No acute bony abnormality or focal bone lesion.  IMPRESSION: Continued atelectasis in the right middle lobe and portions of the right upper lobe with central low-density area measuring smaller than it did on prior study. Continued abrupt cut off of the right middle lobe bronchus and right hilar adenopathy.  Ground-glass opacities in the posterior right lower lobe have increased superiorly. This is nonspecific and could be infectious/inflammatory. Metastatic disease again cannot be excluded.   Electronically Signed   By: Rolm Baptise M.D.   On: 08/19/2014 16:51   ASSESSMENT AND PLAN: This is a very pleasant 73 years old white female with history of stage IIIB non-small cell lung cancer status post a course of concurrent chemoradiation with weekly carboplatin and paclitaxel status post 7 cycles. The patient tolerated her treatment fairly well with no significant adverse effects except for the radiation induced esophagitis.  She was also treated with 3 cycles of consolidation chemotherapy with carboplatin and paclitaxel but carboplatin was discontinued the start of cycle #2 secondary to hypersensitivity reaction. Her restaging scan showed evidence for disease progression and the patient is currently undergoing immunotherapy with Nivolumab 3 mg/KG every 2 weeks is status post 4 cycles. I recommended for her to proceed with cycle #4 today as scheduled. The recent CT scan of the chest showed no  evidence for disease progression. I discussed the scan results with the patient and her friend today. I recommended for her to continue her current treatment with Nivolumab every 2 weeks. For the peripheral neuropathy, she will continue treatment with Neurontin and Percocet. I also increased ibuprofen to 600 mg by mouth every 6 hours as needed for pain. She would come back for followup visit in 2 weeks for reevaluation before starting the next dose of her chemotherapy She was advised to call immediately if she has any concerning symptoms in the interval. The patient voices understanding of current disease status and treatment options and is in agreement with the current care plan.  All questions were answered. The patient knows to call the clinic with any problems, questions or concerns. We can certainly see the patient much sooner if necessary. I spent 15 minutes of face-to-face counseling with the patient and her  friend out of the total visit time 25 minutes.  Disclaimer: This note was dictated with voice recognition software. Similar sounding words can inadvertently be transcribed and may not be corrected upon review.

## 2014-08-22 ENCOUNTER — Ambulatory Visit (HOSPITAL_COMMUNITY): Payer: Medicare Other

## 2014-08-24 ENCOUNTER — Telehealth: Payer: Self-pay

## 2014-08-24 NOTE — Telephone Encounter (Signed)
[facility] notified us they have approved our request for coverage on Lidocaine Patches effective until 08/18/2015 Ref # [id number]

## 2014-08-25 ENCOUNTER — Ambulatory Visit (HOSPITAL_BASED_OUTPATIENT_CLINIC_OR_DEPARTMENT_OTHER): Payer: Medicare Other

## 2014-08-25 ENCOUNTER — Other Ambulatory Visit (HOSPITAL_BASED_OUTPATIENT_CLINIC_OR_DEPARTMENT_OTHER): Payer: Medicare Other

## 2014-08-25 VITALS — BP 130/66 | HR 97 | Temp 97.4°F | Resp 17

## 2014-08-25 DIAGNOSIS — R918 Other nonspecific abnormal finding of lung field: Secondary | ICD-10-CM

## 2014-08-25 DIAGNOSIS — Z5112 Encounter for antineoplastic immunotherapy: Secondary | ICD-10-CM

## 2014-08-25 DIAGNOSIS — C342 Malignant neoplasm of middle lobe, bronchus or lung: Secondary | ICD-10-CM

## 2014-08-25 DIAGNOSIS — C3491 Malignant neoplasm of unspecified part of right bronchus or lung: Secondary | ICD-10-CM

## 2014-08-25 LAB — CBC WITH DIFFERENTIAL/PLATELET
BASO%: 1.3 % (ref 0.0–2.0)
Basophils Absolute: 0.1 10*3/uL (ref 0.0–0.1)
EOS%: 6.7 % (ref 0.0–7.0)
Eosinophils Absolute: 0.5 10*3/uL (ref 0.0–0.5)
HCT: 40.5 % (ref 34.8–46.6)
HGB: 13 g/dL (ref 11.6–15.9)
LYMPH%: 11.2 % — AB (ref 14.0–49.7)
MCH: 28.9 pg (ref 25.1–34.0)
MCHC: 32.1 g/dL (ref 31.5–36.0)
MCV: 89.8 fL (ref 79.5–101.0)
MONO#: 0.6 10*3/uL (ref 0.1–0.9)
MONO%: 8.2 % (ref 0.0–14.0)
NEUT#: 5.2 10*3/uL (ref 1.5–6.5)
NEUT%: 72.6 % (ref 38.4–76.8)
Platelets: 179 10*3/uL (ref 145–400)
RBC: 4.51 10*6/uL (ref 3.70–5.45)
RDW: 16.1 % — ABNORMAL HIGH (ref 11.2–14.5)
WBC: 7.2 10*3/uL (ref 3.9–10.3)
lymph#: 0.8 10*3/uL — ABNORMAL LOW (ref 0.9–3.3)

## 2014-08-25 LAB — COMPREHENSIVE METABOLIC PANEL (CC13)
ALT: 30 U/L (ref 0–55)
ANION GAP: 9 meq/L (ref 3–11)
AST: 20 U/L (ref 5–34)
Albumin: 3.3 g/dL — ABNORMAL LOW (ref 3.5–5.0)
Alkaline Phosphatase: 101 U/L (ref 40–150)
BUN: 14.4 mg/dL (ref 7.0–26.0)
CALCIUM: 9.3 mg/dL (ref 8.4–10.4)
CHLORIDE: 104 meq/L (ref 98–109)
CO2: 23 meq/L (ref 22–29)
Creatinine: 0.8 mg/dL (ref 0.6–1.1)
Glucose: 235 mg/dl — ABNORMAL HIGH (ref 70–140)
POTASSIUM: 3.2 meq/L — AB (ref 3.5–5.1)
Sodium: 137 mEq/L (ref 136–145)
Total Bilirubin: 0.34 mg/dL (ref 0.20–1.20)
Total Protein: 6.3 g/dL — ABNORMAL LOW (ref 6.4–8.3)

## 2014-08-25 MED ORDER — SODIUM CHLORIDE 0.9 % IV SOLN
Freq: Once | INTRAVENOUS | Status: AC
  Administered 2014-08-25: 11:00:00 via INTRAVENOUS

## 2014-08-25 MED ORDER — IBUPROFEN 200 MG PO TABS
600.0000 mg | ORAL_TABLET | Freq: Once | ORAL | Status: AC
  Administered 2014-08-25: 600 mg via ORAL

## 2014-08-25 MED ORDER — IBUPROFEN 200 MG PO TABS
ORAL_TABLET | ORAL | Status: AC
  Filled 2014-08-25: qty 3

## 2014-08-25 MED ORDER — SODIUM CHLORIDE 0.9 % IV SOLN
3.0000 mg/kg | Freq: Once | INTRAVENOUS | Status: AC
  Administered 2014-08-25: 190 mg via INTRAVENOUS
  Filled 2014-08-25: qty 19

## 2014-08-25 NOTE — Progress Notes (Signed)
Pt requesting 600 mg of Ibuprofen for lower back pain.  Rollene Rotunda PA notified and order received to give pt Ibuprofen 600 mg PO now.

## 2014-08-25 NOTE — Progress Notes (Signed)
Per Adrena PA, okay to tx using 08/11/14 CMET.

## 2014-08-25 NOTE — Patient Instructions (Addendum)
[facility] Discharge Instructions for Patients Receiving Chemotherapy  Today you received the following chemotherapy agents: Nivolumab  To help prevent nausea and vomiting after your treatment, we encourage you to take your nausea medication as prescribed by your physician.   If you develop nausea and vomiting that is not controlled by your nausea medication, call the clinic.   BELOW ARE SYMPTOMS THAT SHOULD BE REPORTED IMMEDIATELY:  *FEVER GREATER THAN 100.5 F  *CHILLS WITH OR WITHOUT FEVER  NAUSEA AND VOMITING THAT IS NOT CONTROLLED WITH YOUR NAUSEA MEDICATION  *UNUSUAL SHORTNESS OF BREATH  *UNUSUAL BRUISING OR BLEEDING  TENDERNESS IN MOUTH AND THROAT WITH OR WITHOUT PRESENCE OF ULCERS  *URINARY PROBLEMS  *BOWEL PROBLEMS  UNUSUAL RASH Items with * indicate a potential emergency and should be followed up as soon as possible.  Feel free to call the clinic you have any questions or concerns. The clinic phone number is [phone].   Potassium Content of Foods Potassium is a mineral found in many foods and drinks. It helps keep fluids and minerals balanced in your body and affects how steadily your heart beats. Potassium also helps control your blood pressure and keep your muscles and nervous system healthy. Certain health conditions and medicines may change the balance of potassium in your body. When this happens, you can help balance your level of potassium through the foods that you do or do not eat. Your health care provider or dietitian may recommend an amount of potassium that you should have each day. The following lists of foods provide the amount of potassium (in parentheses) per serving in each item. HIGH IN POTASSIUM  The following foods and beverages have 200 mg or more of potassium per serving:  Apricots, 2 raw or 5 dry (200 mg).  Artichoke, 1 medium (345 mg).  Avocado, raw,  each (245 mg).  Banana, 1 medium (425 mg).  Beans, lima, or  baked beans, canned,  cup (280 mg).  Beans, white, canned,  cup (595 mg).  Beef roast, 3 oz (320 mg).  Beef, ground, 3 oz (270 mg).  Beets, raw or cooked,  cup (260 mg).  Bran muffin, 2 oz (300 mg).  Broccoli,  cup (230 mg).  Brussels sprouts,  cup (250 mg).  Cantaloupe,  cup (215 mg).  Cereal, 100% bran,  cup (200-400 mg).  Cheeseburger, single, fast food, 1 each (225-400 mg).  Chicken, 3 oz (220 mg).  Clams, canned, 3 oz (535 mg).  Crab, 3 oz (225 mg).  Dates, 5 each (270 mg).  Dried beans and peas,  cup (300-475 mg).  Figs, dried, 2 each (260 mg).  Fish: halibut, tuna, cod, snapper, 3 oz (480 mg).  Fish: salmon, haddock, swordfish, perch, 3 oz (300 mg).  Fish, tuna, canned 3 oz (200 mg).  [place] fries, fast food, 3 oz (470 mg).  Granola with fruit and nuts,  cup (200 mg).  Grapefruit juice,  cup (200 mg).  Greens, beet,  cup (655 mg).  Honeydew melon,  cup (200 mg).  Kale, raw, 1 cup (300 mg).  Kiwi, 1 medium (240 mg).  Kohlrabi, rutabaga, parsnips,  cup (280 mg).  Lentils,  cup (365 mg).  Mango, 1 each (325 mg).  Milk, chocolate, 1 cup (420 mg).  Milk: nonfat, low-fat, whole, buttermilk, 1 cup (350-380 mg).  Molasses, 1 Tbsp (295 mg).  Mushrooms,  cup (280) mg.  Nectarine, 1 each (275 mg).  Nuts: almonds, peanuts, hazelnuts, [place], cashew, mixed, 1 oz (200  mg).  Nuts, pistachios, 1 oz (295 mg).  Orange, 1 each (240 mg).  Orange juice,  cup (235 mg).  Papaya, medium,  fruit (390 mg).  Peanut butter, chunky, 2 Tbsp (240 mg).  Peanut butter, smooth, 2 Tbsp (210 mg).  Pear, 1 medium (200 mg).  Pomegranate, 1 whole (400 mg).  Pomegranate juice,  cup (215 mg).  Pork, 3 oz (350 mg).  Potato chips, salted, 1 oz (465 mg).  Potato, baked with skin, 1 medium (925 mg).  Potatoes, boiled,  cup (255 mg).  Potatoes, mashed,  cup (330 mg).  Prune juice,  cup (370 mg).  Prunes, 5 each (305 mg).  Pudding,  chocolate,  cup (230 mg).  Pumpkin, canned,  cup (250 mg).  Raisins, seedless,  cup (270 mg).  Seeds, sunflower or pumpkin, 1 oz (240 mg).  Soy milk, 1 cup (300 mg).  Spinach,  cup (420 mg).  Spinach, canned,  cup (370 mg).  Sweet potato, baked with skin, 1 medium (450 mg).  Swiss chard,  cup (480 mg).  Tomato or vegetable juice,  cup (275 mg).  Tomato sauce or puree,  cup (400-550 mg).  Tomato, raw, 1 medium (290 mg).  Tomatoes, canned,  cup (200-300 mg).  [place], 3 oz (250 mg).  Wheat germ, 1 oz (250 mg).  Winter squash,  cup (250 mg).  Yogurt, plain or fruited, 6 oz (260-435 mg).  Zucchini,  cup (220 mg). MODERATE IN POTASSIUM The following foods and beverages have 50-200 mg of potassium per serving:  Apple, 1 each (150 mg).  Apple juice,  cup (150 mg).  Applesauce,  cup (90 mg).  Apricot nectar,  cup (140 mg).  Asparagus, small spears,  cup or 6 spears (155 mg).  Bagel, cinnamon raisin, 1 each (130 mg).  Bagel, egg or plain, 4 in., 1 each (70 mg).  Beans, green,  cup (90 mg).  Beans, yellow,  cup (190 mg).  Beer, regular, 12 oz (100 mg).  Beets, canned,  cup (125 mg).  Blackberries,  cup (115 mg).  Blueberries,  cup (60 mg).  Bread, whole wheat, 1 slice (70 mg).  Broccoli, raw,  cup (145 mg).  Cabbage,  cup (150 mg).  Carrots, cooked or raw,  cup (180 mg).  Cauliflower, raw,  cup (150 mg).  Celery, raw,  cup (155 mg).  Cereal, bran flakes, cup (120-150 mg).  Cheese, cottage,  cup (110 mg).  Cherries, 10 each (150 mg).  Chocolate, 1 oz bar (165 mg).  Coffee, brewed 6 oz (90 mg).  Corn,  cup or 1 ear (195 mg).  Cucumbers,  cup (80 mg).  Egg, large, 1 each (60 mg).  Eggplant,  cup (60 mg).  Endive, raw, cup (80 mg).  English muffin, 1 each (65 mg).  Fish, orange roughy, 3 oz (150 mg).  Frankfurter, beef or pork, 1 each (75 mg).  Fruit cocktail,  cup (115 mg).  Grape juice,  cup  (170 mg).  Grapefruit,  fruit (175 mg).  Grapes,  cup (155 mg).  Greens: kale, turnip, collard,  cup (110-150 mg).  Ice cream or frozen yogurt, chocolate,  cup (175 mg).  Ice cream or frozen yogurt, vanilla,  cup (120-150 mg).  Lemons, limes, 1 each (80 mg).  Lettuce, all types, 1 cup (100 mg).  Mixed vegetables,  cup (150 mg).  Mushrooms, raw,  cup (110 mg).  Nuts: walnuts, pecans, or macadamia, 1 oz (125 mg).  Oatmeal,  cup (80 mg).  Okra,  cup (110 mg).  Onions, raw,  cup (120 mg).  Peach, 1 each (185 mg).  Peaches, canned,  cup (120 mg).  Pears, canned,  cup (120 mg).  Peas, green, frozen,  cup (90 mg).  Peppers, green,  cup (130 mg).  Peppers, red,  cup (160 mg).  Pineapple juice,  cup (165 mg).  Pineapple, fresh or canned,  cup (100 mg).  Plums, 1 each (105 mg).  Pudding, vanilla,  cup (150 mg).  Raspberries,  cup (90 mg).  Rhubarb,  cup (115 mg).  Rice, wild,  cup (80 mg).  Shrimp, 3 oz (155 mg).  Spinach, raw, 1 cup (170 mg).  Strawberries,  cup (125 mg).  Summer squash  cup (175-200 mg).  Swiss chard, raw, 1 cup (135 mg).  Tangerines, 1 each (140 mg).  Tea, brewed, 6 oz (65 mg).  Turnips,  cup (140 mg).  Watermelon,  cup (85 mg).  Wine, red, table, 5 oz (180 mg).  Wine, white, table, 5 oz (100 mg). LOW IN POTASSIUM The following foods and beverages have less than 50 mg of potassium per serving.  Bread, white, 1 slice (30 mg).  Carbonated beverages, 12 oz (less than 5 mg).  Cheese, 1 oz (20-30 mg).  Cranberries,  cup (45 mg).  Cranberry juice cocktail,  cup (20 mg).  Fats and oils, 1 Tbsp (less than 5 mg).  Hummus, 1 Tbsp (32 mg).  Nectar: papaya, mango, or pear,  cup (35 mg).  Rice, white or brown,  cup (50 mg).  Spaghetti or macaroni,  cup cooked (30 mg).  Tortilla, flour or corn, 1 each (50 mg).  Waffle, 4 in., 1 each (50 mg).  Water chestnuts,  cup (40 mg). Document  Released: 05/31/2005 Document Revised: 10/22/2013 Document Reviewed: 09/13/2013 [facility] Patient Information 2015 [place], [place]. This information is not intended to replace advice given to you by your health care provider. Make sure you discuss any questions you have with your health care provider.

## 2014-08-26 ENCOUNTER — Encounter (HOSPITAL_COMMUNITY): Payer: Medicare Other

## 2014-08-26 NOTE — Progress Notes (Signed)
Quick Note:  Call patient with the result and encourage K rich diet ______ 

## 2014-08-27 ENCOUNTER — Encounter: Payer: Self-pay | Admitting: Internal Medicine

## 2014-08-27 ENCOUNTER — Telehealth: Payer: Self-pay | Admitting: Medical Oncology

## 2014-08-27 ENCOUNTER — Ambulatory Visit (INDEPENDENT_AMBULATORY_CARE_PROVIDER_SITE_OTHER): Payer: Medicare Other | Admitting: Internal Medicine

## 2014-08-27 VITALS — BP 106/76 | HR 110 | Ht 66.0 in | Wt 146.0 lb

## 2014-08-27 DIAGNOSIS — F329 Major depressive disorder, single episode, unspecified: Secondary | ICD-10-CM | POA: Insufficient documentation

## 2014-08-27 DIAGNOSIS — G893 Neoplasm related pain (acute) (chronic): Secondary | ICD-10-CM

## 2014-08-27 DIAGNOSIS — R000 Tachycardia, unspecified: Secondary | ICD-10-CM | POA: Insufficient documentation

## 2014-08-27 DIAGNOSIS — R530 Neoplastic (malignant) related fatigue: Secondary | ICD-10-CM

## 2014-08-27 DIAGNOSIS — F32A Depression, unspecified: Secondary | ICD-10-CM | POA: Insufficient documentation

## 2014-08-27 MED ORDER — AZITHROMYCIN 250 MG PO TABS: ORAL_TABLET | ORAL | Status: DC

## 2014-08-27 MED ORDER — PREDNISONE 10 MG PO TABS: ORAL_TABLET | ORAL | Status: DC

## 2014-08-27 MED ORDER — DULOXETINE HCL 30 MG PO CPEP: 30.0000 mg | ORAL_CAPSULE | Freq: Two times a day (BID) | ORAL | Status: DC

## 2014-08-27 MED ORDER — ZOLPIDEM TARTRATE 5 MG PO TABS: 5.0000 mg | ORAL_TABLET | Freq: Every evening | ORAL | Status: DC | PRN

## 2014-08-27 MED ORDER — FLUTICASONE-SALMETEROL 100-50 MCG/DOSE IN AEPB: 1.0000 | INHALATION_SPRAY | Freq: Two times a day (BID) | RESPIRATORY_TRACT | Status: DC

## 2014-08-27 NOTE — Patient Instructions (Addendum)
#  Fatigue and anorexia  #PEripheral neuropathy  - this is due to pripor chemo cycle with plantinum #COPD exacerbatio #Tachycardia #Depression   -refill ambien  - start duloxetine (cymbalta) 30 mg once daily for 1 week before increasing to 60 mg once day  - note if fatigue or headache worsen, call us immediately - Take prednisone 40 mg daily x 2 days, then 20mg  daily x 2 days, then 10mg  daily x 2 days, then 5mg  daily x 2 days and stop - Take Z PAK - restart ADvair 100/50 1 puff bid - keep hydrated    #Followup  - ensure you respond when [place] nurse calls you - I will see you back in 3 weeks - IF fast heart rate persists, will consider low dose beta blocker

## 2014-08-27 NOTE — Telephone Encounter (Signed)
Message copied by Ardeen Garland on Wed Aug 27, 2014 11:44 AM ------      Message from: Curt Bears      Created: Tue Aug 26, 2014  9:26 PM       Call patient with the result and encourage K rich diet ------

## 2014-08-27 NOTE — Consult Note (Signed)
I have reviewed and discussed case with Nurse Practitioner And agree with documentation and plan as noted above   Nene Aranas J. Lakeysha Slutsky D.O.  Palliative Medicine Team at [place]  Team Phone: [phone]    

## 2014-08-27 NOTE — Progress Notes (Signed)
Subjective:    Patient ID: ELYSSE [name], female    DOB: 04-11-1941, 73 y.o.   MRN: [id number]  HPI   OV 07/29/2014  Chief Complaint  Patient presents with  . Follow-up    Pt c/o increase in SOB and fatigue. Pt denies CP/tightness and cough.    She has stage 3B NSCLC. Her pulmonary problems pre lung cancer was limited to smoking hx with isolated low DLCO only and she was largely asymptomatic. In June 2015 she had some dyspnea and I started her on empiric advair that helped. She is coming now for routien fu  I am learning from her and review of recordds that she failed First line platin, paclitaxel Rx with last dose march 2015; no response in disesae. She moved onto consolidatin chemo with platin and paclitaxel through June 2015 but by June 2015 she develped severe peripheral neuropathy resulting in dc of platin Rx.  Then early sept 2013 strated on Nivolumab Rx IV Q2 weeks. She says tolerated first cycle fine. Got second cycle mid-sept 2015 along with flu shot and after that significant decompensation. Within a few days, pain got worse - in feet, thigh, mid back and worsenign appetite, detrioration in ECOG to 3-4 from 0-1 and worsening dyspnea. . Of note, CT aug 2015 before starting nivolumab showed new onset of RML collapse due to didsease. Now [facility] barely is able to do ADLs, needing cane, gets pushed around to doc office in wheel chair. She has started home PT. Advair helping but continues to have class 2-3 dyspnea on exertion  releived by rest,. She did get chemo Nivolumab (cycle #3) yesterday 07/28/14.    ESAS symptom score below captures her current problem. Respiratory is not her main issue. Below shows significant symptom burder all around esp neuropathic pain, fatiue, anxiety, tiredness, appetite and dyspnea,. Sounds like she has recently been given choice of advil, flexeril and oxycodone to help with pain. She is willing to try CYMBALTA (Duloxeteine)  that can help with platin  neuropathy and depression.    Rec - palliative care opd appt with Megan [name]  - start cymbalta - juidicious nivolumab  OV 08/27/2014  Chief Complaint  Patient presents with  . Follow-up    Pt states she is depressed and c/o peripheral neuropathy. Pt c/o DOE, dry cough. Pt denies CP/tightness.      This is a follow-up appointment for patient with COPD and advanced lung cancer and significant physical and spiritual distress with peripheral neuropathy related to platinum-based therapy and deterioration in functional status to an ECOG of 3   - She is accompanied by her friend who came with her at last visit. At last visit I recommended that she start Cymbalta but she took it for 2 days and because she did not see any pain relief she quit taking the medication. She did follow up with [facility] palliative care nurse in the outpatient setting and had one visit with her [facility] can only see patients in radiation oncology and she made an exception for this patient]. Appears over the interim that Neuropathy deteriorated along with depression she's had severe crying spells. She was referred to neurology and she was started on gabapentin and lidocaine patch but these have not helped. Looking at her [place] symptom assessment scale is significant deterioration in pain, depression, anxiety, loss of appetite, feeling of well-being. In addition she is unable to do rehabilitation at home with physical therapy because of persistent tachycardia [EKG shows sinus rhythm]. He  feels that her quality of life is miserable with a neuropathy and depression  - She is also complaining of a 6 week history of worsening shortness of breath, cough and wheezing but without any sputum production. Of note she did not have the symptoms 4 weeks ago when she saw me but she insists that this has been going on for 6 weeks.  - Of throat, through all this she is going continue to get chemotherapy with Nivolumab    [place] Symptom  Assessment Numerical Scale 0 is no problem -> 10 worst problem 07/29/2014   08/27/2014   No Pain -> Worst pain 6, in mid scapula, left shoulder, buttock and thigh and feet - did have platin neuropathy 8.5  No Tiredness -> Worset tiredness 9 7  No Nausea -> Worst nausea 1 0  No Depression -> Worst depression 4 9  No Anxiety -> Worst Anxiety 6 9  No Drowsiness -> Worst Drowsiness 10 7  Best appetite-> Worst Appetitle 7 10  Best Feeling of well being -> Worst feeling 1 6  No dyspnea-> Worst dyspnea 9 8  Other problem (none -> severe) none Tachycardia - Physical therapy refuses to work wit her. Now in AECOPD with wheeze on exam  Completed by  MR MR  comments  All worse      Review of Systems  Constitutional: Negative for fever and unexpected weight change.  HENT: Negative for congestion, dental problem, ear pain, nosebleeds, postnasal drip, rhinorrhea, sinus pressure, sneezing, sore throat and trouble swallowing.   Eyes: Negative for redness and itching.  Respiratory: Positive for cough and shortness of breath. Negative for chest tightness and wheezing.   Cardiovascular: Negative for palpitations and leg swelling.  Gastrointestinal: Negative for nausea and vomiting.  Genitourinary: Negative for dysuria.  Musculoskeletal: Negative for joint swelling.  Skin: Negative for rash.  Neurological: Negative for headaches.  Hematological: Does not bruise/bleed easily.  Psychiatric/Behavioral: Negative for dysphoric mood. The patient is not nervous/anxious.    Current outpatient prescriptions:Biotin 5000 MCG CAPS, Take 1 capsule by mouth daily. , Disp: , Rfl: ;  docusate sodium (COLACE) 100 MG capsule, Take 1 capsule (100 mg total) by mouth every 12 (twelve) hours., Disp: 60 capsule, Rfl: 0;  Gabapentin, PHN, (GRALISE STARTER) 300 & 600 MG MISC, Take 1,800 mg by mouth at bedtime., Disp: 1 each, Rfl: 1 ibuprofen (ADVIL,MOTRIN) 600 MG tablet, Take 1 tablet (600 mg total) by mouth every 6 (six) hours  as needed. Take with food., Disp: 30 tablet, Rfl: 0;  lidocaine (LIDODERM) 5 %, Place 3 patches onto the skin daily. Remove & Discard patch within 12 hours or as directed by MD. May cut to fit., Disp: 90 patch, Rfl: 0;  losartan-hydrochlorothiazide (HYZAAR) 100-25 MG per tablet, Take 1 tablet by mouth daily., Disp: , Rfl:  vitamin B-12 (CYANOCOBALAMIN) 100 MCG tablet, Take 100 mcg by mouth daily., Disp: , Rfl: ;  zolpidem (AMBIEN) 5 MG tablet, Take 5 mg by mouth at bedtime as needed for sleep., Disp: , Rfl: ;  meloxicam (MOBIC) 15 MG tablet, Take 15 mg by mouth daily., Disp: , Rfl: ;  prochlorperazine (COMPAZINE) 10 MG tablet, Take 10 mg by mouth every 6 (six) hours as needed for nausea or vomiting., Disp: , Rfl:  No current facility-administered medications for this visit. Facility-Administered Medications Ordered in Other Visits: sodium chloride 0.9 % injection 10 mL, 10 mL, Intracatheter, PRN, Curt Bears, MD     Objective:   Physical Exam   Filed  Vitals:   08/27/14 1121  BP: 106/76  Pulse: 110  Height: 5\' 6"  (1.676 m)  Weight: 146 lb (66.225 kg)  SpO2: 97%   hysical Exam  Vitals reviewed. Constitutional: She is oriented to person, place, and time. She appears well-developed and well-nourished. No distress.  . Body mass index is 23.58 kg/(m^2).  ecog now3-4 Looks much weaker - similar to las OV though Has a cane  HENT:  Head: Normocephalic and atraumatic.  Right Ear: External ear normal.  Left Ear: External ear normal.  Mouth/Throat: Oropharynx is clear and moist. No oropharyngeal exudate.  Wig +  Eyes: Conjunctivae and EOM are normal. Pupils are equal, round, and reactive to light. Right eye exhibits no discharge. Left eye exhibits no discharge. No scleral icterus.  Neck: Normal range of motion. Neck supple. No JVD present. No tracheal deviation present. No thyromegaly present.  Cardiovascular: Normal rate, regular rhythm, normal heart sounds and intact distal pulses.  Exam  reveals no gallop and no friction rub.   No murmur heard. Pulmonary/Chest: Effort normal and breath sounds normal. No respiratory distress. She has no wheezes. She has no rales. She exhibits no tenderness.  Abdominal: Soft. Bowel sounds are normal. She exhibits no distension and no mass. There is no tenderness. There is no rebound and no guarding.  Musculoskeletal: Normal range of motion. She exhibits no edema and no tenderness.  Lymphadenopathy:    She has no cervical adenopathy.  Neurological: She is alert and oriented to person, place, and time. She has normal reflexes. No cranial nerve deficit. She exhibits normal muscle tone. Coordination normal.  Skin: Skin is warm and dry. No rash noted. She is not diaphoretic. No erythema. No pallor.  Psychiatric: She has a normal mood and affect. Her behavior is normal. Judgment and thought content normal.        Assessment & Plan:  #Fatigue and anorexia   - due to cancer and chemo   #COPD exacerbatio  - current ongoing 08/28/2014 #Tachycardia  - probably due to all of above  #PEripheral neuropathy  - this is due to pripor chemo cycle with plantinum - neurologist interventions have nnot helped (lidocaine patch and gabapentin)  #Depression  - severe and ongoing  - did not try cymbalta enough  #Poor Functional Status  #Deteriorating ESAS symptoms socre - significant physical and spiritual distress   PLAN   - she needs a community based opd palliative care program instead of going to a specialist for each symmptom. Given her significant distress, I have asked [name] (who can only see patients in rad onc) see her   # For Depression, Anxiety, Pain  - -refill ambien  - RESTARTt duloxetine (cymbalta) 30 mg once daily for 1 week before increasing to 60 mg once day  - note if fatigue or headache worsen, call us immediately  - counseled her extensively about restart cymbalta and giving it several weeks - continnue gabapentin  - dc  lidocaine patch if she wants - Will enquire about PSILOCYBIN (mushroom) hallucinogen for cancer patients (www.heffter.org)  #For AECOPD  Take prednisone 40 mg daily x 2 days, then 20mg  daily x 2 days, then 10mg  daily x 2 days, then 5mg  daily x 2 days and stop - Take Z PAK - restart ADvair 100/50 1 puff bid - keep hydrated  #For Tachycardia  - monitor  - if persists, can try low dose beta blocker   #For Fatigue   - keep hydrated  - if persists, or  worsens, ? Dc Nivolumab   #Followup  - ensure you respond when [facility] calls you - I will see you back in 3 weeks - 4 weeks - IF fast heart rate persists, will consider low dose beta blocker   > 50% of this > 40 min visit spent in face to face counseling (15 min visit converted to 40 min)    Dr. Brand Males, M.D., [facility]C.P Pulmonary and Critical Care Medicine Staff Physician [facility] Pager: [phone], If no answer or between  15:00h - 7:00h: call [phone]  08/28/2014 12:11 AM

## 2014-08-27 NOTE — Telephone Encounter (Signed)
Left message to call.I mailed k rich food list.

## 2014-08-27 NOTE — Telephone Encounter (Signed)
Brief Outpatient Oncology Nutrition Note  Patient has been identified to be at risk on malnutrition screen.  Wt Readings from Last 10 Encounters:  08/27/14 146 lb (66.225 kg)  08/21/14 142 lb 11.2 oz (64.728 kg)  08/13/14 142 lb (64.411 kg)  08/11/14 142 lb 11.2 oz (64.728 kg)  07/29/14 144 lb (65.318 kg)  07/28/14 143 lb 12.8 oz (65.227 kg)  07/18/14 151 lb 11.2 oz (68.811 kg)  07/14/14 151 lb (68.493 kg)  06/23/14 156 lb 11.2 oz (71.079 kg)  06/16/14 153 lb 14.1 oz (69.8 kg)    Dx:  Squamous cell lung CA stage IIIB s/p chemo now undergoing immunotherapy  Called patient due to weigh loss.  Patient seen by the [place] [facility] 05/27/14 with a weight of 153 lbs July 27.  Patient currently is not available.  Contact information for the [place] [facility] provided.  Antonieta Iba, RD, LDN

## 2014-08-28 ENCOUNTER — Encounter (HOSPITAL_COMMUNITY): Payer: Medicare Other

## 2014-08-28 ENCOUNTER — Telehealth: Payer: Self-pay | Admitting: Medical Oncology

## 2014-08-28 ENCOUNTER — Encounter: Payer: Self-pay | Admitting: Medical Oncology

## 2014-08-28 NOTE — Telephone Encounter (Signed)
Pt returned my call and said she received the list of K rich foods . She said she is on Kdur 20 , 2 tabs q hs.

## 2014-09-01 ENCOUNTER — Telehealth: Payer: Self-pay | Admitting: Neurology

## 2014-09-01 NOTE — Telephone Encounter (Signed)
Patient calling to request sooner appointment to see Dr. Jaynee Eagles due to her neuropathy medication not helping, please return call and advise.

## 2014-09-02 ENCOUNTER — Encounter (HOSPITAL_COMMUNITY): Payer: Medicare Other

## 2014-09-02 NOTE — Telephone Encounter (Signed)
Megan [name] - Please give patient an appointment asap. Call patient and work it out with her. Then let me know when she is coming so I can call her as well. This is high priority. thanks

## 2014-09-02 NOTE — Telephone Encounter (Signed)
Per Dr. Cathren Laine last OV note, pt is to call with updates and will discuss over phone and try different things. Fwd to Dr. Jaynee Eagles.

## 2014-09-02 NOTE — Telephone Encounter (Signed)
Spoke to patient. Scheduled appt for tomorrow (09/03/14).

## 2014-09-03 ENCOUNTER — Ambulatory Visit (INDEPENDENT_AMBULATORY_CARE_PROVIDER_SITE_OTHER): Payer: Medicare Other | Admitting: Neurology

## 2014-09-03 ENCOUNTER — Encounter: Payer: Self-pay | Admitting: Neurology

## 2014-09-03 ENCOUNTER — Other Ambulatory Visit: Payer: Self-pay | Admitting: *Deleted

## 2014-09-03 VITALS — BP 111/73 | HR 117 | Temp 97.9°F | Ht 66.0 in | Wt 140.5 lb

## 2014-09-03 DIAGNOSIS — G620 Drug-induced polyneuropathy: Secondary | ICD-10-CM

## 2014-09-03 DIAGNOSIS — T451X5A Adverse effect of antineoplastic and immunosuppressive drugs, initial encounter: Principal | ICD-10-CM

## 2014-09-03 DIAGNOSIS — G622 Polyneuropathy due to other toxic agents: Secondary | ICD-10-CM

## 2014-09-03 MED ORDER — GABAPENTIN (ONCE-DAILY) 600 MG PO TABS: 1800.0000 mg | ORAL_TABLET | Freq: Every day | ORAL | Status: DC

## 2014-09-03 MED ORDER — IBUPROFEN 600 MG PO TABS: 600.0000 mg | ORAL_TABLET | Freq: Four times a day (QID) | ORAL | Status: DC | PRN

## 2014-09-03 MED ORDER — DRONABINOL 10 MG PO CAPS: 10.0000 mg | ORAL_CAPSULE | Freq: Two times a day (BID) | ORAL | Status: DC

## 2014-09-03 NOTE — Patient Instructions (Addendum)
Overall you are doing fairly well but I do want to suggest a few things today:   Remember to drink plenty of fluid, eat healthy meals and do not skip any meals. Try to eat protein with a every meal and eat a healthy snack such as fruit or nuts in between meals. Try to keep a regular sleep-wake schedule and try to exercise daily, particularly in the form of walking, 20-30 minutes a day, if you can.   As far as your medications are concerned, I would like to suggest: Continue Gralise 1800mg  daily. Will start Marinol 10mg  twice daily. Side effects include dizziness, drowsiness, addiction.  I would like to see you back in 3 months, sooner if we need to. Please call us with any interim questions, concerns, problems, updates or refill requests.   Please also call us for any test results so we can go over those with you on the phone.  My clinical assistant and will answer any of your questions and relay your messages to me and also relay most of my messages to you.   Our phone number is [phone]. We also have an after hours call service for urgent matters and there is a physician on-call for urgent questions. For any emergencies you know to call 911 or go to the nearest emergency room

## 2014-09-03 NOTE — Progress Notes (Addendum)
[facility]    Provider:  Dr Jaynee Eagles Referring Provider: Anda Kraft, MD Primary Care Physician:  [name], MD  CC:  Peripheral neuropathy and insomnia  HPI:  Megan [name] is a 73 y.o. female here to follow up on her neuropathy and insomnia.  Interval History: She is still struggling with her peripheral neuropathy, nothing is helping. It is severe. Maybe the Gralise helped but then she ran out and didn't call for a prescription. She was recently given prednisone, cymbalta. Cymbalta made her symptoms worse. Started the Gralise and the 1800 mg worked. Belsomra didn't help. Ambien helps her sleep. Lyrica didn't work either for her peripheral neuropathy. Takes b12 daily and biotin. She is sleeping better with Ambien but the neuropathy is still severe. She can't fall asleep, possibly stress.  Paresthesias are in the feet and hands. They occurred in the setting of platin-based chemotherapy. They are continuous, worse at night when sleeping. She is also very emotional, crying a lot. Her appetite is very poor.  Visit 08/13/14: This is a very pleasant 73 years old white female with history of stage IIIB non-small cell lung cancer status postcarboplatin and paclitaxel. Patient reports she had chemotherapy and radiation from [place] - April 2nd. 6 weeks later started a second set of chemo, 3 sessions every 3 weeks. The neuropathy started about 3 weeks ago. Feels like burning, pins and needles and lightning strikes. Feet and left hand > right hand. It is in the entire feet to the ankles and entire left hand and digits 3-5 right hand. Severe, cramping in the feet and calfs. She is crying in the office today. Oxycodone not helping with the pain, doesn't even touch it. Didn't try Lyrica. Cymbalta worsened the symptoms. Nothing is helping. She is using a cane. No falls. Balance if off. The pain is severe, 10/10 and keeps her from sleeping at night. Symptoms are continuous  and never subside, but are worse at night.  Reviewed notes, labs and imaging from outside physicians, which showed: 1) Concurrent chemoradiation with weekly chemotherapy in the form of carboplatin for AUC of 2 and paclitaxel 45 mg/m2. Status post 7 week of treatment. Last dose was given 01/27/2014 no significant response in her disease.  2) Consolidation chemotherapy with carboplatin for AUC of 5 and paclitaxel 175 mg/M2 every 3 weeks with Neulasta support. First dose on 04/15/2014. Status post 3 cycles. Carboplatin was discontinued starting cycle #2 secondary to hypersensitivity reaction.  Her restaging scan showed evidence for disease progression and the patient is currently undergoing immunotherapy with Nivolumab 3 mg/KG every 2 weeks is status post 3 cycle  Review of Systems: Patient complains of symptoms per HPI as well as the following symptoms: wheezing, chills, fatigue, restless leg, numbness. Pertinent negatives per HPI. All others negative.   History   Social History  . Marital Status: Divorced    Spouse Name: N/A    Number of Children: 0  . Years of Education: MA   Occupational History  . Retired Other   Social History Main Topics  . Smoking status: Former Smoker -- 3.00 packs/day for 45 years    Types: Cigarettes    Quit date: 12/05/1997  . Smokeless tobacco: Never Used  . Alcohol Use: Yes     Comment: occasional  . Drug Use: No  . Sexual Activity: Not Currently   Other Topics Concern  . Not on file   Social History Narrative   Patient lives at home alone.  Caffeine Use: 2 cups daily    Family History  Problem Relation Age of Onset  . Hypertension Other   . Stroke Other   . Heart attack Other   . Hemachromatosis Other   . Rheum arthritis      both sets of grandparents and father  . Other Mother   . Other Father     failure to thrive    Past Medical History  Diagnosis Date  . Hypertension   . Diabetes mellitus   . Rheumatoid arthritis   . GERD  (gastroesophageal reflux disease)     no meds for  . Hx of radiation therapy 12/16/13-01/30/14    lung 66Gy  . Cancer     LUNG  . Neuropathy     Past Surgical History  Procedure Laterality Date  . Cholecystectomy    . Abdominal hysterectomy    . Dilation and curettage of uterus    . Abdominal surgery      Current Outpatient Prescriptions  Medication Sig Dispense Refill  . azithromycin (ZITHROMAX Z-PAK) 250 MG tablet Take as directed. 6 each 0  . Biotin 5000 MCG CAPS Take 1 capsule by mouth daily.     [name] docusate sodium (COLACE) 100 MG capsule Take 1 capsule (100 mg total) by mouth every 12 (twelve) hours. 60 capsule 0  . dronabinol (MARINOL) 10 MG capsule Take 1 capsule (10 mg total) by mouth 2 (two) times daily before a meal. 60 capsule 4  . Fluticasone-Salmeterol (ADVAIR DISKUS) 100-50 MCG/DOSE AEPB Inhale 1 puff into the lungs 2 (two) times daily. 60 each 5  . Gabapentin, Once-Daily, (GRALISE) 600 MG TABS Take 1,800 mg by mouth daily. 90 tablet 6  . Gabapentin, PHN, (GRALISE STARTER) 300 & 600 MG MISC Take 1,800 mg by mouth at bedtime. 1 each 1  . ibuprofen (ADVIL,MOTRIN) 600 MG tablet Take 1 tablet (600 mg total) by mouth every 6 (six) hours as needed. Take with food. 30 tablet 0  . lidocaine (LIDODERM) 5 % Place 3 patches onto the skin daily. Remove & Discard patch within 12 hours or as directed by MD. May cut to fit. 90 patch 0  . losartan-hydrochlorothiazide (HYZAAR) 100-25 MG per tablet Take 1 tablet by mouth daily.    . meloxicam (MOBIC) 15 MG tablet Take 15 mg by mouth daily.    . potassium chloride SA (K-DUR,KLOR-CON) 20 MEQ tablet Take 40 mEq by mouth daily.    . predniSONE (DELTASONE) 10 MG tablet 40 mg daily x 2 days, then 20mg  daily x 2 days, then 10mg  daily x 2 days, then 5mg  daily x 2 days and stop 15 tablet 0  . prochlorperazine (COMPAZINE) 10 MG tablet Take 10 mg by mouth every 6 (six) hours as needed for nausea or vomiting.    . vitamin B-12 (CYANOCOBALAMIN) 100 MCG  tablet Take 100 mcg by mouth daily.    [name] zolpidem (AMBIEN) 5 MG tablet Take 1 tablet (5 mg total) by mouth at bedtime as needed for sleep. 30 tablet 3   No current facility-administered medications for this visit.   Facility-Administered Medications Ordered in Other Visits  Medication Dose Route Frequency Provider Last Rate Last Dose  . sodium chloride 0.9 % injection 10 mL  10 mL Intracatheter PRN Curt Bears, MD   10 mL at 12/30/13 1121    Allergies as of 09/03/2014 - Review Complete 09/03/2014  Allergen Reaction Noted  . Remicade [infliximab] Anaphylaxis 05/13/2012  . Carboplatin  05/29/2014  . Hydromorphone  Rash 08/11/2014    Vitals: BP 111/73 mmHg  Pulse 117  Temp(Src) 97.9 F (36.6 C) (Oral)  Ht 5\' 6"  (1.676 m)  Wt 140 lb 8 oz (63.73 kg)  BMI 22.69 kg/m2 Last Weight:  Wt Readings from Last 1 Encounters:  09/03/14 140 lb 8 oz (63.73 kg)   Last Height:   Ht Readings from Last 1 Encounters:  09/03/14 5\' 6"  (1.676 m)    Neuro: Detailed Neurologic Exam: No Change  Speech:  Speech is normal; fluent and spontaneous with normal comprehension.  Cognition:  The patient is oriented to person, place, and time;   recent and remote memory intact;   language fluent;   normal attention, concentration, fund of knowledge Cranial Nerves:  The pupils are equal, round, and reactive to light. The fundi are normal and spontaneous venous pulsations are present. Visual fields are full to finger confrontation. Extraocular movements are intact. Trigeminal sensation is intact and the muscles of mastication are normal. The face is symmetric. The palate elevates in the midline. Voice is normal. Shoulder shrug is normal. The tongue has normal motion without fasciculations.   Coordination:  Normal finger to nose and heel to shin.   Gait:  Ataxic, wide based, using a cane  Motor Observation:  Distal atrophy most significant in the FDI  Tone:  Normal muscle  tone.   Posture:  Posture is normal. normal erect   Strength:  Mild weakness in the distal extremities. Strength is V/V in the upper and lower limbs.   Sensation: Absent vibration and proprioception in the distal extremities. Impaired pp and temp in the extremities. Allodynia bottom of the feet. +Romberg.   Reflex Exam: Absent ankle jerks   Toes:  The toes are downgoing bilaterally.  Clonus:  Clonus is absent.  Assessment/Plan: This is a very pleasant 73 years old white female with history of stage IIIB non-small cell lung cancer status postcarboplatin and paclitaxel with resultant severe neuropathy. Exam shows severe loss of sensation in the distal extremities, +Romberg, Ataxic gait and Allodynia. Very distressing to patient, she is crying in the office. Keeps her up at night with pain. Cymbalta did not help. There is little evidence for particular agents that are more effective for treating neuropathy caused by chemotherapy. At this point just need to try different medications and somehow try to make patient comfortable.   1. Will continue Gralise 1800mg  daily 2. Will prescribe Marinol for appetite, neuropathy and anxiety. Will help with her appetite loss. Will also help with her anxiety and there is literature which shows it may also be effective for pain management. Discussed the common side effects include abuse and addiction. 3. 3 months follow up or sooner if needed  Sarina Ill, MD  [facility] [address] [place], [place] [address]  Phone [phone] Fax [phone]   Lenor Coffin

## 2014-09-04 ENCOUNTER — Encounter (HOSPITAL_COMMUNITY): Payer: Medicare Other

## 2014-09-08 ENCOUNTER — Other Ambulatory Visit (HOSPITAL_BASED_OUTPATIENT_CLINIC_OR_DEPARTMENT_OTHER): Payer: Medicare Other

## 2014-09-08 ENCOUNTER — Ambulatory Visit (HOSPITAL_BASED_OUTPATIENT_CLINIC_OR_DEPARTMENT_OTHER): Payer: Medicare Other

## 2014-09-08 ENCOUNTER — Telehealth: Payer: Self-pay | Admitting: Physician Assistant

## 2014-09-08 ENCOUNTER — Encounter (HOSPITAL_COMMUNITY): Payer: Self-pay | Admitting: *Deleted

## 2014-09-08 ENCOUNTER — Ambulatory Visit (HOSPITAL_BASED_OUTPATIENT_CLINIC_OR_DEPARTMENT_OTHER): Payer: Medicare Other | Admitting: Physician Assistant

## 2014-09-08 ENCOUNTER — Other Ambulatory Visit: Payer: Self-pay | Admitting: Physician Assistant

## 2014-09-08 ENCOUNTER — Emergency Department (HOSPITAL_COMMUNITY)
Admission: EM | Admit: 2014-09-08 | Discharge: 2014-09-08 | Disposition: A | Discharge status: Home / Self Care | Payer: Medicare Other | Attending: Emergency Medicine | Admitting: Emergency Medicine

## 2014-09-08 ENCOUNTER — Encounter: Payer: Self-pay | Admitting: Physician Assistant

## 2014-09-08 ENCOUNTER — Telehealth: Payer: Self-pay | Admitting: *Deleted

## 2014-09-08 VITALS — BP 95/65 | HR 99 | Temp 98.2°F | Resp 18 | Wt 139.2 lb

## 2014-09-08 DIAGNOSIS — M069 Rheumatoid arthritis, unspecified: Secondary | ICD-10-CM | POA: Diagnosis not present

## 2014-09-08 DIAGNOSIS — Z79899 Other long term (current) drug therapy: Secondary | ICD-10-CM | POA: Diagnosis not present

## 2014-09-08 DIAGNOSIS — R739 Hyperglycemia, unspecified: Secondary | ICD-10-CM

## 2014-09-08 DIAGNOSIS — Z87891 Personal history of nicotine dependence: Secondary | ICD-10-CM | POA: Insufficient documentation

## 2014-09-08 DIAGNOSIS — C770 Secondary and unspecified malignant neoplasm of lymph nodes of head, face and neck: Secondary | ICD-10-CM

## 2014-09-08 DIAGNOSIS — Z8719 Personal history of other diseases of the digestive system: Secondary | ICD-10-CM | POA: Diagnosis not present

## 2014-09-08 DIAGNOSIS — Z791 Long term (current) use of non-steroidal anti-inflammatories (NSAID): Secondary | ICD-10-CM | POA: Diagnosis not present

## 2014-09-08 DIAGNOSIS — C3490 Malignant neoplasm of unspecified part of unspecified bronchus or lung: Secondary | ICD-10-CM

## 2014-09-08 DIAGNOSIS — G629 Polyneuropathy, unspecified: Secondary | ICD-10-CM | POA: Diagnosis not present

## 2014-09-08 DIAGNOSIS — E119 Type 2 diabetes mellitus without complications: Secondary | ICD-10-CM

## 2014-09-08 DIAGNOSIS — R5383 Other fatigue: Secondary | ICD-10-CM

## 2014-09-08 DIAGNOSIS — E1165 Type 2 diabetes mellitus with hyperglycemia: Secondary | ICD-10-CM | POA: Diagnosis present

## 2014-09-08 DIAGNOSIS — Z7951 Long term (current) use of inhaled steroids: Secondary | ICD-10-CM | POA: Diagnosis not present

## 2014-09-08 DIAGNOSIS — C3491 Malignant neoplasm of unspecified part of right bronchus or lung: Secondary | ICD-10-CM

## 2014-09-08 DIAGNOSIS — C342 Malignant neoplasm of middle lobe, bronchus or lung: Secondary | ICD-10-CM

## 2014-09-08 DIAGNOSIS — I10 Essential (primary) hypertension: Secondary | ICD-10-CM | POA: Diagnosis not present

## 2014-09-08 LAB — COMPREHENSIVE METABOLIC PANEL
ALT: 25 U/L (ref 0–35)
AST: 17 U/L (ref 0–37)
Albumin: 3.3 g/dL — ABNORMAL LOW (ref 3.5–5.2)
Alkaline Phosphatase: 118 U/L — ABNORMAL HIGH (ref 39–117)
Anion gap: 12 (ref 5–15)
BUN: 15 mg/dL (ref 6–23)
CALCIUM: 9.6 mg/dL (ref 8.4–10.5)
CHLORIDE: 92 meq/L — AB (ref 96–112)
CO2: 28 meq/L (ref 19–32)
CREATININE: 0.67 mg/dL (ref 0.50–1.10)
GFR, EST NON AFRICAN AMERICAN: 86 mL/min — AB (ref >90)
GLUCOSE: 399 mg/dL — AB (ref 70–99)
Potassium: 3.3 mEq/L — ABNORMAL LOW (ref 3.7–5.3)
Sodium: 132 mEq/L — ABNORMAL LOW (ref 137–147)
Total Bilirubin: 0.4 mg/dL (ref 0.3–1.2)
Total Protein: 6.3 g/dL (ref 6.0–8.3)

## 2014-09-08 LAB — URINALYSIS, ROUTINE W REFLEX MICROSCOPIC
Bilirubin Urine: NEGATIVE
Glucose, UA: >1000 mg/dL — AB
Ketones, ur: NEGATIVE mg/dL
Nitrite: NEGATIVE
PROTEIN: NEGATIVE mg/dL
Specific Gravity, Urine: 1.019 (ref 1.005–1.030)
Urobilinogen, UA: 0.2 mg/dL (ref 0.0–1.0)
pH: 6 (ref 5.0–8.0)

## 2014-09-08 LAB — URINE MICROSCOPIC-ADD ON

## 2014-09-08 LAB — COMPREHENSIVE METABOLIC PANEL (CC13)
ALBUMIN: 3.4 g/dL — AB (ref 3.5–5.0)
ALT: 25 U/L (ref 0–55)
ANION GAP: 13 meq/L — AB (ref 3–11)
AST: 13 U/L (ref 5–34)
Alkaline Phosphatase: 124 U/L (ref 40–150)
BILIRUBIN TOTAL: 0.62 mg/dL (ref 0.20–1.20)
BUN: 14.6 mg/dL (ref 7.0–26.0)
CHLORIDE: 96 meq/L — AB (ref 98–109)
CO2: 23 mEq/L (ref 22–29)
Calcium: 9.3 mg/dL (ref 8.4–10.4)
Creatinine: 1 mg/dL (ref 0.6–1.1)
GLUCOSE: 650 mg/dL — AB (ref 70–140)
POTASSIUM: 3.6 meq/L (ref 3.5–5.1)
SODIUM: 131 meq/L — AB (ref 136–145)
TOTAL PROTEIN: 6.2 g/dL — AB (ref 6.4–8.3)

## 2014-09-08 LAB — CBC WITH DIFFERENTIAL/PLATELET
BASO%: 1 % (ref 0.0–2.0)
Basophils Absolute: 0.1 10*3/uL (ref 0.0–0.1)
EOS ABS: 0 10*3/uL (ref 0.0–0.5)
EOS%: 0.1 % (ref 0.0–7.0)
HCT: 39.9 % (ref 34.8–46.6)
HGB: 13 g/dL (ref 11.6–15.9)
LYMPH#: 0.9 10*3/uL (ref 0.9–3.3)
LYMPH%: 14.7 % (ref 14.0–49.7)
MCH: 29.1 pg (ref 25.1–34.0)
MCHC: 32.7 g/dL (ref 31.5–36.0)
MCV: 89.1 fL (ref 79.5–101.0)
MONO#: 0.5 10*3/uL (ref 0.1–0.9)
MONO%: 8.9 % (ref 0.0–14.0)
NEUT%: 75.3 % (ref 38.4–76.8)
NEUTROS ABS: 4.4 10*3/uL (ref 1.5–6.5)
Platelets: 158 10*3/uL (ref 145–400)
RBC: 4.48 10*6/uL (ref 3.70–5.45)
RDW: 16.4 % — AB (ref 11.2–14.5)
WBC: 5.8 10*3/uL (ref 3.9–10.3)

## 2014-09-08 LAB — WHOLE BLOOD GLUCOSE
GLUCOSE: 481 mg/dL — AB (ref 70–100)
HRS PC: 0.75 Hours

## 2014-09-08 LAB — CBG MONITORING, ED
Glucose-Capillary: 395 mg/dL — ABNORMAL HIGH (ref 70–99)
Glucose-Capillary: 164 mg/dL — ABNORMAL HIGH (ref 70–99)

## 2014-09-08 LAB — CBC
HCT: 37.3 % (ref 36.0–46.0)
HEMOGLOBIN: 12.8 g/dL (ref 12.0–15.0)
MCH: 29.8 pg (ref 26.0–34.0)
MCHC: 34.3 g/dL (ref 30.0–36.0)
MCV: 86.7 fL (ref 78.0–100.0)
Platelets: 138 10*3/uL — ABNORMAL LOW (ref 150–400)
RBC: 4.3 MIL/uL (ref 3.87–5.11)
RDW: 15.2 % (ref 11.5–15.5)
WBC: 5 10*3/uL (ref 4.0–10.5)

## 2014-09-08 MED ORDER — METFORMIN HCL 500 MG PO TABS: 500.0000 mg | ORAL_TABLET | Freq: Two times a day (BID) | ORAL | Status: DC

## 2014-09-08 MED ORDER — INSULIN ASPART 100 UNIT/ML ~~LOC~~ SOLN
5.0000 [IU] | Freq: Once | SUBCUTANEOUS | Status: DC
  Filled 2014-09-08: qty 1

## 2014-09-08 MED ORDER — INSULIN REGULAR HUMAN 100 UNIT/ML IJ SOLN
20.0000 [IU] | Freq: Once | INTRAMUSCULAR | Status: AC
  Administered 2014-09-08: 20 [IU] via SUBCUTANEOUS
  Filled 2014-09-08: qty 0.2

## 2014-09-08 NOTE — Discharge Instructions (Signed)
Please call your doctor for a followup appointment within 24-48 hours. When you talk to your doctor please let them know that you were seen in the emergency department and have them acquire all of your records so that they can discuss the findings with you and formulate a treatment plan to fully care for your new and ongoing problems. ° °

## 2014-09-08 NOTE — ED Notes (Signed)
Pt is requesting that all fo CBG sticks are done IV and not in her fingers.

## 2014-09-08 NOTE — Patient Instructions (Signed)

## 2014-09-08 NOTE — Telephone Encounter (Signed)
Pt confirmed labs/ov per 11/09 POF, sent msg to r/s chemo, gave pt AVS ..... KJ

## 2014-09-08 NOTE — ED Notes (Signed)
Bed: OI37 Expected date:  Expected time:  Means of arrival:  Comments: hyperglycemia

## 2014-09-08 NOTE — ED Notes (Addendum)
Endocrine assessment: Pt with hx of DM taken off of metformin after significant weight loss post chemotherapy; pt reports increased thirst and frequent urination, however she thought that's due to gabapentin.

## 2014-09-08 NOTE — Telephone Encounter (Signed)
Per staff message and POF I have scheduled appts. Advised scheduler of appts. JMW  

## 2014-09-08 NOTE — ED Notes (Addendum)
Initial contact: Pt was at cancer center for treatment, however her CBG was too high, pt was given 20 units of insulin and CBG is 480 after. Pt was taken off of metformin in the summer after loosing significant amount of weight post chemotherapy.

## 2014-09-08 NOTE — Progress Notes (Signed)
Glucose 650 this am. Pt. States she has been very thirsty lately, drinking a lot of water, colas and up to the bathroom frequently. Received order to hold chemo this am and to give Regular Insulin 20 units SQ. Then to re-check CBG in 1 hour.  Re-explained this to pt and she verbalized understanding.  1236: Repeat Gluocse level=481.  Discussed with Harvie Junior, PA and decision made to send patient to the ED. Pt and her friend, Greer Pickerel informed of this and are agreeable.Report called to Marzetta Board, charge nurse and pt. Taken to ED room #14 via w/c. Report to [place], Therapist, sports

## 2014-09-08 NOTE — Progress Notes (Addendum)
[facility] [phone]   [phone]  OFFICE PROGRESS NOTE  [name], MD [address] [place] [address] [place] [address]  DIAGNOSIS: Squamous cell lung cancer  Primary site: Lung (Right)  Staging method: AJCC 7th Edition  Clinical: Stage IIIB (T3, N3, M0)  Summary: Stage IIIB (T3, N3, M0)   PRIOR THERAPY:  1) Concurrent chemoradiation with weekly chemotherapy in the form of carboplatin for AUC of 2 and paclitaxel 45 mg/m2. Status post 7 week of treatment. Last dose was given 01/27/2014 no significant response in her disease.  2) Consolidation chemotherapy with carboplatin for AUC of 5 and paclitaxel 175 mg/M2 every 3 weeks with Neulasta support. First dose on 04/15/2014. Status post 3 cycles. Carboplatin was discontinued starting cycle #2 secondary to hypersensitivity reaction.  CURRENT THERAPY: Immunotherapy with Opdivo (Nivolumab) 3mg /kg given every 2 weeks. Status post 5 cycles.  CHEMOTHERAPY INTENT: Control/Palliative  CURRENT # OF CHEMOTHERAPY CYCLES: 5 CURRENT ANTIEMETICS: Zofran, dexamethasone, Compazine  CURRENT SMOKING STATUS: Former smoker, quit 12/05/1997  ORAL CHEMOTHERAPY AND CONSENT: n/a  CURRENT BISPHOSPHONATES USE: None  PAIN MANAGEMENT: no cancer related pain, patient does have RA  NARCOTICS INDUCED CONSTIPATION: none  LIVING WILL AND CODE STATUS: ?   INTERVAL HISTORY: Megan [name] 74 y.o. female returns to the clinic today for followup visit accompanied by a friend. She came today with the same previous complaints including fatigue and weakness as well as peripheral neuropathy.she reports she was recently started on gabapentin. She complains of dry mouth since starting the gabapentin and has been drinking a lot of fluids. She is tolerating her current immunotherapy fairly well with no significant adverse effects.she denies any change in her baseline shortness of breath, diarrhea or skin rash. The patient  denied having any significant weight loss or night sweats. She has no nausea or vomiting. She has no fever or chills. She denied having any significant chest pain but continues to have shortness breath with exertion with no cough or hemoptysis. She is here today to start cycle #5 of her treatment.   MEDICAL HISTORY: Past Medical History  Diagnosis Date  . Hypertension   . Diabetes mellitus   . Rheumatoid arthritis   . GERD (gastroesophageal reflux disease)     no meds for  . Hx of radiation therapy 12/16/13-01/30/14    lung 66Gy  . Cancer     LUNG  . Neuropathy     ALLERGIES:  is allergic to remicade; carboplatin; and hydromorphone.  MEDICATIONS:  Current Outpatient Prescriptions  Medication Sig Dispense Refill  . Biotin 5000 MCG CAPS Take 1 capsule by mouth daily.     [name] docusate sodium (COLACE) 100 MG capsule Take 1 capsule (100 mg total) by mouth every 12 (twelve) hours. 60 capsule 0  . Fluticasone-Salmeterol (ADVAIR DISKUS) 100-50 MCG/DOSE AEPB Inhale 1 puff into the lungs 2 (two) times daily. 60 each 5  . Gabapentin, Once-Daily, (GRALISE) 600 MG TABS Take 1,800 mg by mouth daily. 90 tablet 6  . Gabapentin, PHN, (GRALISE STARTER) 300 & 600 MG MISC Take 1,800 mg by mouth at bedtime. 1 each 1  . ibuprofen (ADVIL,MOTRIN) 600 MG tablet Take 1 tablet (600 mg total) by mouth every 6 (six) hours as needed. Take with food. 30 tablet 0  . lidocaine (LIDODERM) 5 % Place 3 patches onto the skin daily. Remove & Discard patch within 12 hours or as directed by MD. May cut to fit. 90 patch 0  .  losartan-hydrochlorothiazide (HYZAAR) 100-25 MG per tablet Take 1 tablet by mouth daily.    . meloxicam (MOBIC) 15 MG tablet Take 15 mg by mouth daily.    . potassium chloride SA (K-DUR,KLOR-CON) 20 MEQ tablet Take 40 mEq by mouth daily.    . vitamin B-12 (CYANOCOBALAMIN) 100 MCG tablet Take 100 mcg by mouth daily.    [name] zolpidem (AMBIEN) 5 MG tablet Take 1 tablet (5 mg total) by mouth at bedtime as needed  for sleep. 30 tablet 3  . azithromycin (ZITHROMAX Z-PAK) 250 MG tablet Take as directed. 6 each 0  . dronabinol (MARINOL) 10 MG capsule Take 1 capsule (10 mg total) by mouth 2 (two) times daily before a meal. 60 capsule 4  . metFORMIN (GLUCOPHAGE) 500 MG tablet Take 1 tablet (500 mg total) by mouth 2 (two) times daily with a meal. 60 tablet 1  . predniSONE (DELTASONE) 10 MG tablet 40 mg daily x 2 days, then 20mg  daily x 2 days, then 10mg  daily x 2 days, then 5mg  daily x 2 days and stop 15 tablet 0  . prochlorperazine (COMPAZINE) 10 MG tablet Take 10 mg by mouth every 6 (six) hours as needed for nausea or vomiting.     No current facility-administered medications for this visit.   Facility-Administered Medications Ordered in Other Visits  Medication Dose Route Frequency Provider Last Rate Last Dose  . insulin aspart (novoLOG) injection 5 Units  5 Units Subcutaneous Once Johnna Acosta, MD      . sodium chloride 0.9 % injection 10 mL  10 mL Intracatheter PRN Curt Bears, MD   10 mL at 12/30/13 1121    SURGICAL HISTORY:  Past Surgical History  Procedure Laterality Date  . Cholecystectomy    . Abdominal hysterectomy    . Dilation and curettage of uterus    . Abdominal surgery      REVIEW OF SYSTEMS:  Constitutional: negative Eyes: negative Ears, nose, mouth, throat, and face: negative Respiratory: negative Cardiovascular: negative Gastrointestinal: positive for dyspepsia Genitourinary:negative Integument/breast: negative Hematologic/lymphatic: negative Musculoskeletal:negative Neurological: negative Behavioral/Psych: negative Endocrine: negative Allergic/Immunologic: negative   PHYSICAL EXAMINATION: General appearance: alert, cooperative and no distress Head: Normocephalic, without obvious abnormality, atraumatic Neck: no adenopathy, no JVD, supple, symmetrical, trachea midline and thyroid not enlarged, symmetric, no tenderness/mass/nodules Lymph nodes: Cervical,  supraclavicular, and axillary nodes normal. Resp: clear to auscultation bilaterally Back: symmetric, no curvature. ROM normal. No CVA tenderness. Cardio: regular rate and rhythm, S1, S2 normal, no murmur, click, rub or gallop GI: soft, non-tender; bowel sounds normal; no masses,  no organomegaly Extremities: extremities normal, atraumatic, no cyanosis or edema Neurologic: Alert and oriented X 3, normal strength and tone. Normal symmetric reflexes. Normal coordination and gait  ECOG PERFORMANCE STATUS: 1 - Symptomatic but completely ambulatory  Blood pressure 95/65, pulse 99, temperature 98.2 F (36.8 C), temperature source Oral, resp. rate 18, weight 139 lb 3.2 oz (63.141 kg), SpO2 96 %.  LABORATORY DATA: Lab Results  Component Value Date   WBC 5.0 09/08/2014   HGB 12.8 09/08/2014   HCT 37.3 09/08/2014   MCV 86.7 09/08/2014   PLT 138* 09/08/2014      Chemistry      Component Value Date/Time   NA 132* 09/08/2014 1417   NA 131* 09/08/2014 0946   K 3.3* 09/08/2014 1417   K 3.6 09/08/2014 0946   CL 92* 09/08/2014 1417   CO2 28 09/08/2014 1417   CO2 23 09/08/2014 0946   BUN 15 09/08/2014  1417   BUN 14.6 09/08/2014 0946   CREATININE 0.67 09/08/2014 1417   CREATININE 1.0 09/08/2014 0946   GLU 481* 09/08/2014 1319      Component Value Date/Time   CALCIUM 9.6 09/08/2014 1417   CALCIUM 9.3 09/08/2014 0946   ALKPHOS 118* 09/08/2014 1417   ALKPHOS 124 09/08/2014 0946   AST 17 09/08/2014 1417   AST 13 09/08/2014 0946   ALT 25 09/08/2014 1417   ALT 25 09/08/2014 0946   BILITOT 0.4 09/08/2014 1417   BILITOT 0.62 09/08/2014 0946       RADIOGRAPHIC STUDIES: Ct Chest W Contrast  08/19/2014   CLINICAL DATA:  Right lung cancer, diagnosed December 2014. Chemotherapy complete. Restaging. Dry cough.  EXAM: CT CHEST WITH CONTRAST  TECHNIQUE: Multidetector CT imaging of the chest was performed during intravenous contrast administration.  CONTRAST:  14mL OMNIPAQUE IOHEXOL 300 MG/ML   SOLN  COMPARISON:  Chest CT 06/13/2014  FINDINGS: Again noted is the left thyroid nodule, measuring approximately 1.9 cm, stable. Chest wall soft tissues are normal. Heart is normal size. Aorta is normal caliber.  Calcified granuloma in the left lower lobe measures 7 mm. Calcified left hilar lymph nodes.  Again noted is right middle lobe and partial right upper lobe collapse, surrounding the right upper lobe mass. Best estimation of the central low-attenuation masslike area in the region of atelectasis currently measures 3.0 x 1.8 cm compared with 3.8 x 2.1 cm previously. Left hilar adenopathy measures up to 1.4 cm compared with 1.6 cm previously. Abrupt cut off of the right middle lobe bronchus again noted with a stable appearance. Ground-glass opacification posteriorly in the right lower lobe has increased slightly superiorly where the area of ground-glass density measures up to approximately 3.1 cm in thickness compared to 2.4 cm previously. No confluent opacities or suspicious nodules on the left. No pleural effusions.  No mediastinal or axillary adenopathy. Imaging into the upper abdomen shows no acute findings. Prior cholecystectomy. Low-density areas with in the liver are stable, likely cysts. Calcified granulomas in the spleen.  No acute bony abnormality or focal bone lesion.  IMPRESSION: Continued atelectasis in the right middle lobe and portions of the right upper lobe with central low-density area measuring smaller than it did on prior study. Continued abrupt cut off of the right middle lobe bronchus and right hilar adenopathy.  Ground-glass opacities in the posterior right lower lobe have increased superiorly. This is nonspecific and could be infectious/inflammatory. Metastatic disease again cannot be excluded.   Electronically Signed   By: Rolm Baptise M.D.   On: 08/19/2014 16:51   ASSESSMENT AND PLAN: This is a very pleasant 73 years old white female with history of stage IIIB non-small cell lung  cancer status post a course of concurrent chemoradiation with weekly carboplatin and paclitaxel status post 7 cycles. The patient tolerated her treatment fairly well with no significant adverse effects except for the radiation induced esophagitis.  She was also treated with 3 cycles of consolidation chemotherapy with carboplatin and paclitaxel but carboplatin was discontinued the start of cycle #2 secondary to hypersensitivity reaction. Her restaging scan showed evidence for disease progression and the patient is currently undergoing immunotherapy with Nivolumab 3 mg/KG every 2 weeks is status post 5 cycles.  The recent CT scan of the chest showed no evidence for disease progression. Patient was discussed with and also seen by Dr. Julien Nordmann. Her chemistries revealed a serum glucose of 650.cycle #6 of her immunotherapy with Nivolumab will be postponed by  1 week. Patient was given 20 units of regular insulin in our infusion area, observed for 1 hour followed by repeat whole blood glucose which was 481. Patient was sent to the emergency room for further evaluation and management of her hyperglycemia. I also contacted her primary care/endocrinologist office Dr. Wilson Singer, who was in agreement with emergency room [facility] evaluation of her the elevated glucose reading. He would be able to work her in to be further evaluated in his office on 09/09/2014. Patient/caregiver is to call for a specific time. This information was given to both patient and her friend. For the peripheral neuropathy, she will continue treatment with Neurontin and Percocet. Continue ibuprofen to 600 mg by mouth every 6 hours as needed for pain. She will return in one week with repeat labs to proceed with cycle #6 of her immunotherapy and then again in 3 weeks prior to cycle #7 for another symptom management visit.  She was advised to call immediately if she has any concerning symptoms in the interval. The patient voices understanding of current  disease status and treatment options and is in agreement with the current care plan.  All questions were answered. The patient knows to call the clinic with any problems, questions or concerns. We can certainly see the patient much sooner if necessary.  Carlton Adam, PA_C  ADDENDUM: Hematology/Oncology Attending: I had a face to face encounter with the patient. I recommended her care plan. This is a very pleasant 73 years old white female with progressive non-small cell lung cancer, squamous cell carcinoma initially diagnosed as stage IIIB status post concurrent chemoradiation followed by systemic chemotherapy with carboplatin and paclitaxel and she is currently on treatment with immunotherapy with Nivolumab status post 4 cycles. The recent CT scan of the chest showed no evidence for disease progression. I discussed the scan results with the patient and her friend. I recommended for her to continue her current treatment with Nivolumab. Her blood sugar is so elevated today and the patient is not currently on any treatment. I will give her a dose of regular insulin 20 units followed by repeat blood sugar in one hour. If she continues to have significant elevation of her blood sugar the patient will be sent to the emergency department for further evaluation. We also discussed her case with her primary care physician for evaluation and management of her hyperglycemia. We will delay her treatment with cycle #5 by 1 week until you have good control of her blood sugar. She would come back for follow-up visit in 3 weeks with the start of cycle #6. She was advised to call immediately if she has any concerning symptoms in the interval.  Disclaimer: This note was dictated with voice recognition software. Similar sounding words can inadvertently be transcribed and may not be corrected upon review. Eilleen Kempf., MD 09/13/2014

## 2014-09-08 NOTE — ED Provider Notes (Signed)
CSN: [id number]     Arrival date & time 09/08/14  1347 History   First MD Initiated Contact with Patient 09/08/14 1517     Chief Complaint  Patient presents with  . Hyperglycemia     (Consider location/radiation/quality/duration/timing/severity/associated sxs/prior Treatment) HPI Comments: The pt is a 73 y/o female with stage 3 Lung CA - currently undergoing Chemo - she was in the CA center this AM for a treatment and was found to have high CBG ~ 400.  She was given fluids, and sent to the Ed.  She endorses having urinary frequency and increased thirst but denies fevers, pain, vomiting, diarrhea.  She does have increased freq but this is at night only.  She also endorses having a coke and OJ this AM.  She was started on DM meds with metformin several years ago - lost over 40 pounds which allowed her to stop the metformin, she has been off for over 1 year and has had good check ups at the office with Dr. Wilson Singer.  She was unaware that her CBG was rising.    Patient is a 73 y.o. female presenting with hyperglycemia. The history is provided by the patient and medical records.  Hyperglycemia   Past Medical History  Diagnosis Date  . Hypertension   . Diabetes mellitus   . Rheumatoid arthritis   . GERD (gastroesophageal reflux disease)     no meds for  . Hx of radiation therapy 12/16/13-01/30/14    lung 66Gy  . Cancer     LUNG  . Neuropathy    Past Surgical History  Procedure Laterality Date  . Cholecystectomy    . Abdominal hysterectomy    . Dilation and curettage of uterus    . Abdominal surgery     Family History  Problem Relation Age of Onset  . Hypertension Other   . Stroke Other   . Heart attack Other   . Hemachromatosis Other   . Rheum arthritis      both sets of grandparents and father  . Other Mother   . Other Father     failure to thrive   History  Substance Use Topics  . Smoking status: Former Smoker -- 3.00 packs/day for 45 years    Types: Cigarettes    Quit date:  12/05/1997  . Smokeless tobacco: Never Used  . Alcohol Use: Yes     Comment: occasional   OB History    No data available     Review of Systems  All other systems reviewed and are negative.     Allergies  Remicade; Carboplatin; and Hydromorphone  Home Medications   Prior to Admission medications   Medication Sig Start Date End Date Taking? Authorizing Provider  Biotin 5000 MCG CAPS Take 1 capsule by mouth daily.    Yes Historical Provider, MD  docusate sodium (COLACE) 100 MG capsule Take 1 capsule (100 mg total) by mouth every 12 (twelve) hours. 07/22/14  Yes Evelina Bucy, MD  Fluticasone-Salmeterol (ADVAIR DISKUS) 100-50 MCG/DOSE AEPB Inhale 1 puff into the lungs 2 (two) times daily. 08/27/14  Yes Brand Males, MD  Gabapentin, Once-Daily, (GRALISE) 600 MG TABS Take 1,800 mg by mouth daily. 09/03/14  Yes Melvenia Beam, MD  ibuprofen (ADVIL,MOTRIN) 600 MG tablet Take 1 tablet (600 mg total) by mouth every 6 (six) hours as needed. Take with food. 09/03/14  Yes Curt Bears, MD  lidocaine (LIDODERM) 5 % Place 3 patches onto the skin daily. Remove & Discard patch within  12 hours or as directed by MD. May cut to fit. 08/13/14  Yes Melvenia Beam, MD  losartan-hydrochlorothiazide (HYZAAR) 100-25 MG per tablet Take 1 tablet by mouth daily.   Yes Historical Provider, MD  meloxicam (MOBIC) 15 MG tablet Take 15 mg by mouth daily.   Yes Historical Provider, MD  potassium chloride SA (K-DUR,KLOR-CON) 20 MEQ tablet Take 40 mEq by mouth daily.   Yes Historical Provider, MD  vitamin B-12 (CYANOCOBALAMIN) 100 MCG tablet Take 100 mcg by mouth daily.   Yes Historical Provider, MD  zolpidem (AMBIEN) 5 MG tablet Take 1 tablet (5 mg total) by mouth at bedtime as needed for sleep. 08/27/14  Yes Brand Males, MD  azithromycin (ZITHROMAX Z-PAK) 250 MG tablet Take as directed. 08/27/14   Brand Males, MD  dronabinol (MARINOL) 10 MG capsule Take 1 capsule (10 mg total) by mouth 2 (two) times  daily before a meal. 09/03/14   Melvenia Beam, MD  Gabapentin, PHN, (GRALISE STARTER) 300 & 600 MG MISC Take 1,800 mg by mouth at bedtime. 08/13/14   Melvenia Beam, MD  metFORMIN (GLUCOPHAGE) 500 MG tablet Take 1 tablet (500 mg total) by mouth 2 (two) times daily with a meal. 09/08/14   Johnna Acosta, MD  predniSONE (DELTASONE) 10 MG tablet 40 mg daily x 2 days, then 20mg  daily x 2 days, then 10mg  daily x 2 days, then 5mg  daily x 2 days and stop 08/27/14   Brand Males, MD  prochlorperazine (COMPAZINE) 10 MG tablet Take 10 mg by mouth every 6 (six) hours as needed for nausea or vomiting.    Historical Provider, MD   BP 152/88 mmHg  Pulse 109  Temp(Src) 97.7 F (36.5 C) (Oral)  Resp 16  SpO2 96% Physical Exam  Constitutional: She appears well-developed and well-nourished. No distress.  HENT:  Head: Normocephalic and atraumatic.  Mouth/Throat: Oropharynx is clear and moist. No oropharyngeal exudate.  MMM  Eyes: Conjunctivae and EOM are normal. Pupils are equal, round, and reactive to light. Right eye exhibits no discharge. Left eye exhibits no discharge. No scleral icterus.  Neck: Normal range of motion. Neck supple. No JVD present. No thyromegaly present.  Cardiovascular: Normal rate, regular rhythm, normal heart sounds and intact distal pulses.  Exam reveals no gallop and no friction rub.   No murmur heard. Pulmonary/Chest: Effort normal and breath sounds normal. No respiratory distress. She has no wheezes. She has no rales.  Abdominal: Soft. Bowel sounds are normal. She exhibits no distension and no mass. There is no tenderness.  Musculoskeletal: Normal range of motion. She exhibits no edema or tenderness.  Lymphadenopathy:    She has no cervical adenopathy.  Neurological: She is alert.  Decreased sensation to the fingers and toes.  Normal strenght.  CN 3-12 normal.  Skin: Skin is warm and dry. No rash noted. No erythema.  Psychiatric: She has a normal mood and affect. Her  behavior is normal.  Nursing note and vitals reviewed.   ED Course  Procedures (including critical care time) Labs Review Labs Reviewed  CBC - Abnormal; Notable for the following:    Platelets 138 (*)    All other components within normal limits  COMPREHENSIVE METABOLIC PANEL - Abnormal; Notable for the following:    Sodium 132 (*)    Potassium 3.3 (*)    Chloride 92 (*)    Glucose, Bld 399 (*)    Albumin 3.3 (*)    Alkaline Phosphatase 118 (*)  GFR calc non Af Amer 86 (*)    All other components within normal limits  CBG MONITORING, ED - Abnormal; Notable for the following:    Glucose-Capillary 395 (*)    All other components within normal limits  CBG MONITORING, ED - Abnormal; Notable for the following:    Glucose-Capillary 164 (*)    All other components within normal limits  URINALYSIS, ROUTINE W REFLEX MICROSCOPIC  URINALYSIS, ROUTINE W REFLEX MICROSCOPIC  CBG MONITORING, ED    Imaging Review No results found.    MDM   Final diagnoses:  Hyperglycemia    The pt has a baseline neuropathy - she is on neurontin *(chemo induced neuropathy).  She has no new findings on exam and labs don't suggest, DKA.  Fluids have been given, check UA and recheck CBG, small am't of insulin, anticipate need to restart metformin.  D/w pt who is in agreement.  Filed Vitals:   09/08/14 1353  BP: 139/69  Pulse: 96  Temp: 97.8 F (36.6 C)  TempSrc: Oral  Resp: 16  SpO2: 98%   Patient's blood sugar has improved significantly after fluids, insulin is not required, the patient will be discharged home in stable condition, she is under the understanding that she will go see her family doctor within 2 days for recheck of a plastic blood sugar. Until that time we'll start metformin. Meds given in ED:  Medications  insulin aspart (novoLOG) injection 5 Units (not administered)    New Prescriptions   METFORMIN (GLUCOPHAGE) 500 MG TABLET    Take 1 tablet (500 mg total) by mouth 2 (two)  times daily with a meal.      Johnna Acosta, MD 09/08/14 1649

## 2014-09-08 NOTE — Patient Instructions (Signed)
Your blood sugar is extremely elevated and your being sent to the emergency room for further evaluation and management. Call Dr. Eugenio Hoes office in the morning for an appointment for further management Your scheduled immunotherapy is being postponed by 1 week due to your significantly elevated blood sugar Follow-up in again in 3 weeks prior to the next scheduled cycle of immunotherapy

## 2014-09-09 ENCOUNTER — Telehealth: Payer: Self-pay | Admitting: Internal Medicine

## 2014-09-09 ENCOUNTER — Encounter (HOSPITAL_COMMUNITY): Payer: Medicare Other

## 2014-09-09 NOTE — Telephone Encounter (Signed)
pt called to r/s appt..done...pt aware of new d.t °

## 2014-09-11 ENCOUNTER — Encounter (HOSPITAL_COMMUNITY): Payer: Medicare Other

## 2014-09-15 ENCOUNTER — Other Ambulatory Visit: Payer: Medicare Other

## 2014-09-15 ENCOUNTER — Telehealth: Payer: Self-pay | Admitting: Neurology

## 2014-09-15 ENCOUNTER — Telehealth: Payer: Self-pay

## 2014-09-15 ENCOUNTER — Ambulatory Visit: Payer: Medicare Other

## 2014-09-15 NOTE — Telephone Encounter (Signed)
Patient stated Pharmacy has not received Rx for dronabinol (MARINOL) 10 MG capsule and requesting Rx for Gabapentin sent to [facility] at [facility].  Dr. Jaynee Eagles had given samples of Gabapentin and medication was very beneficial.  Please call and advise.

## 2014-09-15 NOTE — Telephone Encounter (Deleted)
Patient would like to continue taking Gabapentin (Gralise).  Last OV note says:  Continue Gralise 1800mg  daily

## 2014-09-15 NOTE — Telephone Encounter (Signed)
Rx's were sent to the pharmacy on 11/04.  The pharmacy saved them on the patient's file.  I called the patient back, got no answer.  Left message.

## 2014-09-15 NOTE — Telephone Encounter (Signed)
error 

## 2014-09-16 ENCOUNTER — Ambulatory Visit (HOSPITAL_BASED_OUTPATIENT_CLINIC_OR_DEPARTMENT_OTHER): Payer: Medicare Other

## 2014-09-16 ENCOUNTER — Encounter (HOSPITAL_COMMUNITY): Payer: Medicare Other

## 2014-09-16 ENCOUNTER — Other Ambulatory Visit (HOSPITAL_BASED_OUTPATIENT_CLINIC_OR_DEPARTMENT_OTHER): Payer: Medicare Other

## 2014-09-16 DIAGNOSIS — C342 Malignant neoplasm of middle lobe, bronchus or lung: Secondary | ICD-10-CM

## 2014-09-16 DIAGNOSIS — C3491 Malignant neoplasm of unspecified part of right bronchus or lung: Secondary | ICD-10-CM

## 2014-09-16 DIAGNOSIS — Z5112 Encounter for antineoplastic immunotherapy: Secondary | ICD-10-CM

## 2014-09-16 LAB — CBC WITH DIFFERENTIAL/PLATELET
BASO%: 0.7 % (ref 0.0–2.0)
BASOS ABS: 0 10*3/uL (ref 0.0–0.1)
EOS ABS: 0 10*3/uL (ref 0.0–0.5)
EOS%: 0 % (ref 0.0–7.0)
HEMATOCRIT: 39.4 % (ref 34.8–46.6)
HGB: 13 g/dL (ref 11.6–15.9)
LYMPH%: 17 % (ref 14.0–49.7)
MCH: 28.8 pg (ref 25.1–34.0)
MCHC: 33 g/dL (ref 31.5–36.0)
MCV: 87.4 fL (ref 79.5–101.0)
MONO#: 0.5 10*3/uL (ref 0.1–0.9)
MONO%: 9.1 % (ref 0.0–14.0)
NEUT%: 73.2 % (ref 38.4–76.8)
NEUTROS ABS: 3.8 10*3/uL (ref 1.5–6.5)
PLATELETS: 161 10*3/uL (ref 145–400)
RBC: 4.51 10*6/uL (ref 3.70–5.45)
RDW: 16.8 % — ABNORMAL HIGH (ref 11.2–14.5)
WBC: 5.1 10*3/uL (ref 3.9–10.3)
lymph#: 0.9 10*3/uL (ref 0.9–3.3)

## 2014-09-16 LAB — COMPREHENSIVE METABOLIC PANEL (CC13)
ALK PHOS: 85 U/L (ref 40–150)
ALT: 24 U/L (ref 0–55)
ANION GAP: 8 meq/L (ref 3–11)
AST: 15 U/L (ref 5–34)
Albumin: 3.4 g/dL — ABNORMAL LOW (ref 3.5–5.0)
BILIRUBIN TOTAL: 0.6 mg/dL (ref 0.20–1.20)
BUN: 14.1 mg/dL (ref 7.0–26.0)
CO2: 22 meq/L (ref 22–29)
CREATININE: 0.8 mg/dL (ref 0.6–1.1)
Calcium: 9.4 mg/dL (ref 8.4–10.4)
Chloride: 103 mEq/L (ref 98–109)
GLUCOSE: 299 mg/dL — AB (ref 70–140)
Potassium: 3.8 mEq/L (ref 3.5–5.1)
Sodium: 133 mEq/L — ABNORMAL LOW (ref 136–145)
Total Protein: 6 g/dL — ABNORMAL LOW (ref 6.4–8.3)

## 2014-09-16 MED ORDER — SODIUM CHLORIDE 0.9 % IV SOLN
3.0000 mg/kg | Freq: Once | INTRAVENOUS | Status: DC
  Filled 2014-09-16: qty 19

## 2014-09-16 MED ORDER — SODIUM CHLORIDE 0.9 % IV SOLN
3.1000 mg/kg | Freq: Once | INTRAVENOUS | Status: AC
  Administered 2014-09-16: 200 mg via INTRAVENOUS
  Filled 2014-09-16: qty 20

## 2014-09-16 MED ORDER — SODIUM CHLORIDE 0.9 % IV SOLN
Freq: Once | INTRAVENOUS | Status: AC
  Administered 2014-09-16: 10:00:00 via INTRAVENOUS

## 2014-09-16 NOTE — Progress Notes (Signed)
Mohamed MD aware of patient ED visit with increased blood sugar. Discussed patient condition with patient family and is ok with proceeding with treatment.

## 2014-09-16 NOTE — Patient Instructions (Signed)
[facility] Discharge Instructions for Patients Receiving Chemotherapy  Today you received the following chemotherapy agents Nivolumab.  To help prevent nausea and vomiting after your treatment, we encourage you to take your nausea medication as directed.    If you develop nausea and vomiting that is not controlled by your nausea medication, call the clinic.   BELOW ARE SYMPTOMS THAT SHOULD BE REPORTED IMMEDIATELY:  *FEVER GREATER THAN 100.5 F  *CHILLS WITH OR WITHOUT FEVER  NAUSEA AND VOMITING THAT IS NOT CONTROLLED WITH YOUR NAUSEA MEDICATION  *UNUSUAL SHORTNESS OF BREATH  *UNUSUAL BRUISING OR BLEEDING  TENDERNESS IN MOUTH AND THROAT WITH OR WITHOUT PRESENCE OF ULCERS  *URINARY PROBLEMS  *BOWEL PROBLEMS  UNUSUAL RASH Items with * indicate a potential emergency and should be followed up as soon as possible.  Feel free to call the clinic you have any questions or concerns. The clinic phone number is [phone].

## 2014-09-17 NOTE — Telephone Encounter (Signed)
Patient calling to state that her pharmacy has still not received her Marinol and Gabapentin scripts, states that she's very upset because she "suffers everyday, all day long", please return call to patient and advise, she can be reached at [phone].

## 2014-09-17 NOTE — Telephone Encounter (Signed)
I called the pharmacy who said they do have both Rx's.  They were having issues with billing.  I called ins to see if there was anything we needed to do.  They verified they have approved coverage on Gral[employer] Ref # [id number] and say the pharmacy has to bill Medicare Part B for Marinol, instead of Part D Ref # [id number].  I called the pharmacy back.  They were able to get Gralise to go through, but said they would have to call Medicare about the Marinol because they are requesting a specific NDC.  I called the patient back.  She is aware.

## 2014-09-18 ENCOUNTER — Encounter (HOSPITAL_COMMUNITY): Payer: Medicare Other

## 2014-09-18 ENCOUNTER — Other Ambulatory Visit: Payer: Self-pay | Admitting: Medical Oncology

## 2014-09-18 DIAGNOSIS — C3490 Malignant neoplasm of unspecified part of unspecified bronchus or lung: Secondary | ICD-10-CM

## 2014-09-18 MED ORDER — IBUPROFEN 600 MG PO TABS: 600.0000 mg | ORAL_TABLET | Freq: Four times a day (QID) | ORAL | Status: DC | PRN

## 2014-09-19 ENCOUNTER — Telehealth: Payer: Self-pay | Admitting: Internal Medicine

## 2014-09-19 NOTE — Telephone Encounter (Signed)
returned pt call and lvm confirming appt

## 2014-09-22 ENCOUNTER — Ambulatory Visit: Payer: Medicare Other

## 2014-09-22 ENCOUNTER — Other Ambulatory Visit: Payer: Self-pay | Admitting: *Deleted

## 2014-09-22 ENCOUNTER — Other Ambulatory Visit: Payer: Medicare Other

## 2014-09-22 ENCOUNTER — Telehealth: Payer: Self-pay | Admitting: Internal Medicine

## 2014-09-22 ENCOUNTER — Ambulatory Visit: Payer: Medicare Other | Attending: Endocrinology

## 2014-09-22 NOTE — Telephone Encounter (Signed)
Confirmed with pharmacy that she picked up #30 of 600 mg ibuprofen on 09/19/14. Received refill request today for 400 mg ibuprofen. Pharmacy reports this was sent in error-should be 600 mg tablets. Will ask MD if he will increase quantity on this from #30.

## 2014-09-22 NOTE — Telephone Encounter (Signed)
returned pt call and confirmed appt cx...pt has Q2wk appt...Marland KitchenMarland Kitchen

## 2014-09-23 ENCOUNTER — Encounter (HOSPITAL_COMMUNITY): Payer: Medicare Other

## 2014-09-29 ENCOUNTER — Telehealth: Payer: Self-pay | Admitting: Internal Medicine

## 2014-09-29 ENCOUNTER — Other Ambulatory Visit (HOSPITAL_BASED_OUTPATIENT_CLINIC_OR_DEPARTMENT_OTHER): Payer: Medicare Other

## 2014-09-29 ENCOUNTER — Ambulatory Visit (HOSPITAL_BASED_OUTPATIENT_CLINIC_OR_DEPARTMENT_OTHER): Payer: Medicare Other

## 2014-09-29 ENCOUNTER — Ambulatory Visit (HOSPITAL_BASED_OUTPATIENT_CLINIC_OR_DEPARTMENT_OTHER): Payer: Medicare Other | Admitting: Internal Medicine

## 2014-09-29 ENCOUNTER — Encounter: Payer: Self-pay | Admitting: Internal Medicine

## 2014-09-29 ENCOUNTER — Telehealth: Payer: Self-pay | Admitting: *Deleted

## 2014-09-29 ENCOUNTER — Ambulatory Visit
Admission: RE | Admit: 2014-09-29 | Discharge: 2014-09-29 | Disposition: A | Discharge status: Home / Self Care | Payer: Medicare Other | Source: Ambulatory Visit | Attending: Internal Medicine | Admitting: Internal Medicine

## 2014-09-29 VITALS — BP 111/71 | HR 85 | Resp 18 | Ht 66.0 in | Wt 134.9 lb

## 2014-09-29 DIAGNOSIS — C342 Malignant neoplasm of middle lobe, bronchus or lung: Secondary | ICD-10-CM

## 2014-09-29 DIAGNOSIS — G629 Polyneuropathy, unspecified: Secondary | ICD-10-CM

## 2014-09-29 DIAGNOSIS — C3491 Malignant neoplasm of unspecified part of right bronchus or lung: Secondary | ICD-10-CM

## 2014-09-29 DIAGNOSIS — C770 Secondary and unspecified malignant neoplasm of lymph nodes of head, face and neck: Secondary | ICD-10-CM

## 2014-09-29 DIAGNOSIS — Z5112 Encounter for antineoplastic immunotherapy: Secondary | ICD-10-CM

## 2014-09-29 DIAGNOSIS — I10 Essential (primary) hypertension: Secondary | ICD-10-CM

## 2014-09-29 LAB — COMPREHENSIVE METABOLIC PANEL (CC13)
ALBUMIN: 3.7 g/dL (ref 3.5–5.0)
ALT: 16 U/L (ref 0–55)
ANION GAP: 13 meq/L — AB (ref 3–11)
AST: 13 U/L (ref 5–34)
Alkaline Phosphatase: 100 U/L (ref 40–150)
BUN: 20.6 mg/dL (ref 7.0–26.0)
CHLORIDE: 98 meq/L (ref 98–109)
CO2: 21 mEq/L — ABNORMAL LOW (ref 22–29)
CREATININE: 1 mg/dL (ref 0.6–1.1)
Calcium: 9.7 mg/dL (ref 8.4–10.4)
Glucose: 444 mg/dl — ABNORMAL HIGH (ref 70–140)
POTASSIUM: 3.7 meq/L (ref 3.5–5.1)
Sodium: 132 mEq/L — ABNORMAL LOW (ref 136–145)
Total Bilirubin: 0.75 mg/dL (ref 0.20–1.20)
Total Protein: 6.4 g/dL (ref 6.4–8.3)

## 2014-09-29 LAB — CBC WITH DIFFERENTIAL/PLATELET
BASO%: 0.1 % (ref 0.0–2.0)
Basophils Absolute: 0 10*3/uL (ref 0.0–0.1)
EOS ABS: 0 10*3/uL (ref 0.0–0.5)
EOS%: 0 % (ref 0.0–7.0)
HEMATOCRIT: 41.3 % (ref 34.8–46.6)
HGB: 13.9 g/dL (ref 11.6–15.9)
LYMPH#: 0.9 10*3/uL (ref 0.9–3.3)
LYMPH%: 13.7 % — AB (ref 14.0–49.7)
MCH: 29 pg (ref 25.1–34.0)
MCHC: 33.7 g/dL (ref 31.5–36.0)
MCV: 86.2 fL (ref 79.5–101.0)
MONO#: 0.6 10*3/uL (ref 0.1–0.9)
MONO%: 8 % (ref 0.0–14.0)
NEUT%: 78.2 % — AB (ref 38.4–76.8)
NEUTROS ABS: 5.4 10*3/uL (ref 1.5–6.5)
PLATELETS: 162 10*3/uL (ref 145–400)
RBC: 4.79 10*6/uL (ref 3.70–5.45)
RDW: 15.4 % — ABNORMAL HIGH (ref 11.2–14.5)
WBC: 6.9 10*3/uL (ref 3.9–10.3)

## 2014-09-29 LAB — TSH CHCC: TSH: 1.371 m[IU]/L (ref 0.308–3.960)

## 2014-09-29 MED ORDER — SODIUM CHLORIDE 0.9 % IV SOLN
Freq: Once | INTRAVENOUS | Status: AC
  Administered 2014-09-29: 11:00:00 via INTRAVENOUS

## 2014-09-29 MED ORDER — SODIUM CHLORIDE 0.9 % IV SOLN
3.0000 mg/kg | Freq: Once | INTRAVENOUS | Status: AC
  Administered 2014-09-29: 190 mg via INTRAVENOUS
  Filled 2014-09-29: qty 19

## 2014-09-29 NOTE — Progress Notes (Signed)
Declined nutritional referral for decreased appetite and diabetic diet options

## 2014-09-29 NOTE — Telephone Encounter (Signed)
Gave avs & cal for Dec. Sent mess to sch tcx.

## 2014-09-29 NOTE — Patient Instructions (Signed)
Nivolumab injection What is this medicine? NIVOLUMAB (nye VOL ue mab) is used to treat certain types of melanoma and lung cancer. What side effects may I notice from receiving this medicine? Side effects that you should report to your doctor or health care professional as soon as possible: -allergic reactions like skin rash, itching or hives, swelling of the face, lips, or tongue -black, tarry stools -bloody or watery diarrhea -changes in vision -chills -cough -depressed mood -eye pain -feeling anxious -fever -general ill feeling or flu-like symptoms -hair loss -loss of appetite -low blood counts - this medicine may decrease the number of white blood cells, red blood cells and platelets. You may be at increased risk for infections and bleeding -pain, tingling, numbness in the hands or feet -redness, blistering, peeling or loosening of the skin, including inside the mouth -red pinpoint spots on skin -signs of decreased platelets or bleeding - bruising, pinpoint red spots on the skin, black, tarry stools, blood in the urine -signs of decreased red blood cells - unusually weak or tired, feeling faint or lightheaded, falls -signs of infection - fever or chills, cough, sore throat, pain or trouble passing urine -signs and symptoms of a dangerous change in heartbeat or heart rhythm like chest pain; dizziness; fast or irregular heartbeat; palpitations; feeling faint or lightheaded, falls; breathing problems -signs and symptoms of high blood sugar such as dizziness; dry mouth; dry skin; fruity breath; nausea; stomach pain; increased hunger or thirst; increased urination -signs and symptoms of kidney injury like trouble passing urine or change in the amount of urine -signs and symptoms of liver injury like dark yellow or brown urine; general ill feeling or flu-like symptoms; light-colored stools; loss of appetite; nausea; right upper belly pain; unusually weak or tired; yellowing of the eyes or  skin -signs and symptoms of increased potassium like muscle weakness; chest pain; or fast, irregular heartbeat -signs and symptoms of low potassium like muscle cramps or muscle pain; chest pain; dizziness; feeling faint or lightheaded, falls; palpitations; breathing problems; or fast, irregular heartbeat -swelling of the ankles, feet, hands -weight gainSide effects that usually do not require medical attention (report to your doctor or health care professional if they continue or are bothersome): -constipation -general ill feeling or flu-like symptoms -hair loss -loss of appetite -nausea, vomiting This list may not describe all possible side effects. Call your doctor for medical advice about side effects. You may report side effects to FDA at 1-800-FDA-1088.  [name] Discharge Instructions for Patients Receiving Chemotherapy  Today you received the following chemotherapy agents NIVOLUMAB   To help prevent nausea and vomiting after your treatment, we encourage you to take your nausea medication AS NEEDED   If you develop nausea and vomiting that is not controlled by your nausea medication, call the clinic.   BELOW ARE SYMPTOMS THAT SHOULD BE REPORTED IMMEDIATELY:  *FEVER GREATER THAN 100.5 F  *CHILLS WITH OR WITHOUT FEVER  NAUSEA AND VOMITING THAT IS NOT CONTROLLED WITH YOUR NAUSEA MEDICATION  *UNUSUAL SHORTNESS OF BREATH  *UNUSUAL BRUISING OR BLEEDING  TENDERNESS IN MOUTH AND THROAT WITH OR WITHOUT PRESENCE OF ULCERS  *URINARY PROBLEMS  *BOWEL PROBLEMS  UNUSUAL RASH Items with * indicate a potential emergency and should be followed up as soon as possible.  Feel free to call the clinic you have any questions or concerns. The clinic phone number is [phone].

## 2014-09-29 NOTE — Progress Notes (Signed)
[facility] [phone]   [phone]  OFFICE PROGRESS NOTE  Megan Bolt, MD [address] [place] [address] [place] [address]  DIAGNOSIS: Squamous cell lung cancer  Primary site: Lung (Right)  Staging method: AJCC 7th Edition  Clinical: Stage IIIB (T3, N3, M0)  Summary: Stage IIIB (T3, N3, M0)   PRIOR THERAPY:  1) Concurrent chemoradiation with weekly chemotherapy in the form of carboplatin for AUC of 2 and paclitaxel 45 mg/m2. Status post 7 week of treatment. Last dose was given 01/27/2014 no significant response in her disease.  2) Consolidation chemotherapy with carboplatin for AUC of 5 and paclitaxel 175 mg/M2 every 3 weeks with Neulasta support. First dose on 04/15/2014. Status post 3 cycles. Carboplatin was discontinued starting cycle #2 secondary to hypersensitivity reaction.  CURRENT THERAPY: Immunotherapy with Opdivo (Nivolumab) 3mg /kg given every 2 weeks. Status post 6 cycles.  CHEMOTHERAPY INTENT: Control/Palliative  CURRENT # OF CHEMOTHERAPY CYCLES: 7 CURRENT ANTIEMETICS: Zofran, dexamethasone, Compazine  CURRENT SMOKING STATUS: Former smoker, quit 12/05/1997  ORAL CHEMOTHERAPY AND CONSENT: n/a  CURRENT BISPHOSPHONATES USE: None  PAIN MANAGEMENT: no cancer related pain, patient does have RA  NARCOTICS INDUCED CONSTIPATION: none  LIVING WILL AND CODE STATUS: ?   INTERVAL HISTORY: Megan [name] 73 y.o. female returns to the clinic today for followup visit accompanied by a friend. The patient is doing fine today was no specific complaints except for the persistent peripheral neuropathy more in the toes than the fingers. She tried Cymbalta but she was unable to tolerate it. She is currently on gabapentin 600 mg by mouth twice a day. The patient denied having any significant chest pain but has shortness breath with exertion with no cough or hemoptysis. She denied having any significant weight loss or night sweats. She has  no nausea or vomiting. She missed her treatment last week and she is here today to resume her immunotherapy with Nivolumab.  MEDICAL HISTORY: Past Medical History  Diagnosis Date  . Hypertension   . Diabetes mellitus   . Rheumatoid arthritis   . GERD (gastroesophageal reflux disease)     no meds for  . Hx of radiation therapy 12/16/13-01/30/14    lung 66Gy  . Cancer     LUNG  . Neuropathy     ALLERGIES:  is allergic to remicade; carboplatin; and hydromorphone.  MEDICATIONS:  Current Outpatient Prescriptions  Medication Sig Dispense Refill  . Biotin 5000 MCG CAPS Take 1 capsule by mouth daily.     [name] docusate sodium (COLACE) 100 MG capsule Take 1 capsule (100 mg total) by mouth every 12 (twelve) hours. (Patient taking differently: Take 100 mg by mouth daily. ) 60 capsule 0  . dronabinol (MARINOL) 10 MG capsule Take 1 capsule (10 mg total) by mouth 2 (two) times daily before a meal. 60 capsule 4  . Fluticasone-Salmeterol (ADVAIR DISKUS) 100-50 MCG/DOSE AEPB Inhale 1 puff into the lungs 2 (two) times daily. 60 each 5  . Gabapentin, Once-Daily, (GRALISE) 600 MG TABS Take 1,800 mg by mouth daily. 90 tablet 6  . Gabapentin, PHN, (GRALISE STARTER) 300 & 600 MG MISC Take 1,800 mg by mouth at bedtime. 1 each 1  . ibuprofen (ADVIL,MOTRIN) 600 MG tablet Take 1 tablet (600 mg total) by mouth every 6 (six) hours as needed. Take with food. 30 tablet 0  . lidocaine (LIDODERM) 5 % PLACE 3 PATCHES ONTO THE SKIN EVERY DAY. REMOVE AND DISCARD PATCH 12 HOURS LATER. ON FOR 12  HOURS, OFF FOR 12 HOURS. MAY CUT PATCH TO FIT 90 patch 1  . losartan-hydrochlorothiazide (HYZAAR) 100-25 MG per tablet Take 1 tablet by mouth daily.    . meloxicam (MOBIC) 15 MG tablet Take 15 mg by mouth daily.    . metFORMIN (GLUCOPHAGE) 500 MG tablet Take 1 tablet (500 mg total) by mouth 2 (two) times daily with a meal. 60 tablet 1  . potassium chloride SA (K-DUR,KLOR-CON) 20 MEQ tablet Take 40 mEq by mouth daily.    .  prochlorperazine (COMPAZINE) 10 MG tablet Take 10 mg by mouth every 6 (six) hours as needed for nausea or vomiting.    . vitamin B-12 (CYANOCOBALAMIN) 100 MCG tablet Take 100 mcg by mouth daily.    [name] zolpidem (AMBIEN) 10 MG tablet Take 10 mg by mouth at bedtime as needed for sleep.     No current facility-administered medications for this visit.   Facility-Administered Medications Ordered in Other Visits  Medication Dose Route Frequency Provider Last Rate Last Dose  . sodium chloride 0.9 % injection 10 mL  10 mL Intracatheter PRN Curt Bears, MD   10 mL at 12/30/13 1121    SURGICAL HISTORY:  Past Surgical History  Procedure Laterality Date  . Cholecystectomy    . Abdominal hysterectomy    . Dilation and curettage of uterus    . Abdominal surgery      REVIEW OF SYSTEMS:  Constitutional: negative Eyes: negative Ears, nose, mouth, throat, and face: negative Respiratory: negative Cardiovascular: negative Gastrointestinal: positive for dyspepsia Genitourinary:negative Integument/breast: negative Hematologic/lymphatic: negative Musculoskeletal:negative Neurological: negative Behavioral/Psych: negative Endocrine: negative Allergic/Immunologic: negative   PHYSICAL EXAMINATION: General appearance: alert, cooperative and no distress Head: Normocephalic, without obvious abnormality, atraumatic Neck: no adenopathy, no JVD, supple, symmetrical, trachea midline and thyroid not enlarged, symmetric, no tenderness/mass/nodules Lymph nodes: Cervical, supraclavicular, and axillary nodes normal. Resp: clear to auscultation bilaterally Back: symmetric, no curvature. ROM normal. No CVA tenderness. Cardio: regular rate and rhythm, S1, S2 normal, no murmur, click, rub or gallop GI: soft, non-tender; bowel sounds normal; no masses,  no organomegaly Extremities: extremities normal, atraumatic, no cyanosis or edema Neurologic: Alert and oriented X 3, normal strength and tone. Normal symmetric  reflexes. Normal coordination and gait  ECOG PERFORMANCE STATUS: 1 - Symptomatic but completely ambulatory  Blood pressure 111/71, pulse 85, temperature 0 F (-17.8 C), resp. rate 18, height 5\' 6"  (1.676 m), weight 134 lb 14.4 oz (61.19 kg).  LABORATORY DATA: Lab Results  Component Value Date   WBC 6.9 09/29/2014   HGB 13.9 09/29/2014   HCT 41.3 09/29/2014   MCV 86.2 09/29/2014   PLT 162 09/29/2014      Chemistry      Component Value Date/Time   NA 132* 09/29/2014 0917   NA 132* 09/08/2014 1417   K 3.7 09/29/2014 0917   K 3.3* 09/08/2014 1417   CL 92* 09/08/2014 1417   CO2 21* 09/29/2014 0917   CO2 28 09/08/2014 1417   BUN 20.6 09/29/2014 0917   BUN 15 09/08/2014 1417   CREATININE 1.0 09/29/2014 0917   CREATININE 0.67 09/08/2014 1417   GLU 481* 09/08/2014 1319      Component Value Date/Time   CALCIUM 9.7 09/29/2014 0917   CALCIUM 9.6 09/08/2014 1417   ALKPHOS 100 09/29/2014 0917   ALKPHOS 118* 09/08/2014 1417   AST 13 09/29/2014 0917   AST 17 09/08/2014 1417   ALT 16 09/29/2014 0917   ALT 25 09/08/2014 1417   BILITOT 0.75 09/29/2014  5320   BILITOT 0.4 09/08/2014 1417       RADIOGRAPHIC STUDIES:  ASSESSMENT AND PLAN: This is a very pleasant 73 years old white female with history of stage IIIB non-small cell lung cancer status post a course of concurrent chemoradiation with weekly carboplatin and paclitaxel status post 7 cycles. The patient tolerated her treatment fairly well with no significant adverse effects except for the radiation induced esophagitis.  She was also treated with 3 cycles of consolidation chemotherapy with carboplatin and paclitaxel but carboplatin was discontinued the start of cycle #2 secondary to hypersensitivity reaction. Her restaging scan showed evidence for disease progression and the patient is currently undergoing immunotherapy with Nivolumab 3 mg/KG every 2 weeks is status post 6 cycles. I recommended for her to proceed with cycle #7  today as scheduled. For the peripheral neuropathy, she will continue treatment with Neurontin and Percocet.  For the peripheral neuropathy she will continue on ibuprofen to 600 mg by mouth every 6 hours as needed for pain. She would come back for followup visit in 2 weeks for reevaluation before starting the next dose of her chemotherapy She was advised to call immediately if she has any concerning symptoms in the interval. The patient voices understanding of current disease status and treatment options and is in agreement with the current care plan.  All questions were answered. The patient knows to call the clinic with any problems, questions or concerns. We can certainly see the patient much sooner if necessary.  Disclaimer: This note was dictated with voice recognition software. Similar sounding words can inadvertently be transcribed and may not be corrected upon review.

## 2014-09-29 NOTE — Telephone Encounter (Signed)
Per staff message and POF I have scheduled appts. Advised scheduler of appts. JMW  

## 2014-09-30 ENCOUNTER — Encounter: Payer: Self-pay | Admitting: Internal Medicine

## 2014-09-30 ENCOUNTER — Ambulatory Visit (INDEPENDENT_AMBULATORY_CARE_PROVIDER_SITE_OTHER): Payer: Medicare Other | Admitting: Internal Medicine

## 2014-09-30 VITALS — BP 102/62 | HR 96 | Ht 66.0 in | Wt 138.0 lb

## 2014-09-30 DIAGNOSIS — G893 Neoplasm related pain (acute) (chronic): Secondary | ICD-10-CM

## 2014-09-30 DIAGNOSIS — C3490 Malignant neoplasm of unspecified part of unspecified bronchus or lung: Secondary | ICD-10-CM

## 2014-09-30 DIAGNOSIS — F329 Major depressive disorder, single episode, unspecified: Secondary | ICD-10-CM

## 2014-09-30 DIAGNOSIS — R530 Neoplastic (malignant) related fatigue: Secondary | ICD-10-CM

## 2014-09-30 DIAGNOSIS — R64 Cachexia: Secondary | ICD-10-CM

## 2014-09-30 DIAGNOSIS — F32A Depression, unspecified: Secondary | ICD-10-CM

## 2014-09-30 NOTE — Patient Instructions (Addendum)
#  COPD exacerbation  - resolved  #Fatigue and anorexia  #PEripheral neuropathy  - this is due to pripor chemo cycle with plantinum #Depression    - refer Home PT  -we discussed home hospice options when chemo no longer works: please keep this in mind   #Followup  -  I will see you back in 6  weeks

## 2014-09-30 NOTE — Progress Notes (Signed)
Subjective:    Patient ID: Megan [name], female    DOB: 04-11-1941, 73 y.o.   MRN: [id number]  HPI   OV 07/29/2014  Chief Complaint  Patient presents with  . Follow-up    Pt c/o increase in SOB and fatigue. Pt denies CP/tightness and cough.    She has stage 3B NSCLC. Her pulmonary problems pre lung cancer was limited to smoking hx with isolated low DLCO only and she was largely asymptomatic. In June 2015 she had some dyspnea and I started her on empiric advair that helped. She is coming now for routien fu  I am learning from her and review of recordds that she failed First line platin, paclitaxel Rx with last dose march 2015; no response in disesae. She moved onto consolidatin chemo with platin and paclitaxel through June 2015 but by June 2015 she develped severe peripheral neuropathy resulting in dc of platin Rx.  Then early sept 2013 strated on Nivolumab Rx IV Q2 weeks. She says tolerated first cycle fine. Got second cycle mid-sept 2015 along with flu shot and after that significant decompensation. Within a few days, pain got worse - in feet, thigh, mid back and worsenign appetite, detrioration in ECOG to 3-4 from 0-1 and worsening dyspnea. . Of note, CT aug 2015 before starting nivolumab showed new onset of RML collapse due to didsease. Now [facility] barely is able to do ADLs, needing cane, gets pushed around to doc office in wheel chair. She has started home PT. Advair helping but continues to have class 2-3 dyspnea on exertion  releived by rest,. She did get chemo Nivolumab (cycle #3) yesterday 07/28/14.    ESAS symptom score below captures her current problem. Respiratory is not her main issue. Below shows significant symptom burder all around esp neuropathic pain, fatiue, anxiety, tiredness, appetite and dyspnea,. Sounds like she has recently been given choice of advil, flexeril and oxycodone to help with pain. She is willing to try CYMBALTA (Duloxeteine)  that can help with platin  neuropathy and depression.    Rec - palliative care opd appt with Starleen Arms  - start cymbalta - juidicious nivolumab  OV 08/27/2014  Chief Complaint  Patient presents with  . Follow-up    Pt states she is depressed and c/o peripheral neuropathy. Pt c/o DOE, dry cough. Pt denies CP/tightness.      This is a follow-up appointment for patient with COPD and advanced lung cancer and significant physical and spiritual distress with peripheral neuropathy related to platinum-based therapy and deterioration in functional status to an ECOG of 3   - She is accompanied by her friend who came with her at last visit. At last visit I recommended that she start Cymbalta but she took it for 2 days and because she did not see any pain relief she quit taking the medication. She did follow up with [facility] palliative care nurse in the outpatient setting and had one visit with her [facility] can only see patients in radiation oncology and she made an exception for this patient]. Appears over the interim that Neuropathy deteriorated along with depression she's had severe crying spells. She was referred to neurology and she was started on gabapentin and lidocaine patch but these have not helped. Looking at her [place] symptom assessment scale is significant deterioration in pain, depression, anxiety, loss of appetite, feeling of well-being. In addition she is unable to do rehabilitation at home with physical therapy because of persistent tachycardia [EKG shows sinus rhythm]. He  feels that her quality of life is miserable with a neuropathy and depression  - She is also complaining of a 6 week history of worsening shortness of breath, cough and wheezing but without any sputum production. Of note she did not have the symptoms 4 weeks ago when she saw me but she insists that this has been going on for 6 weeks.  - , through all this she is going continue to get chemotherapy with Nivolumab    OV 09/30/2014  Chief  Complaint  Patient presents with  . Follow-up    Pt states she has an increase in fatigue. Pt c/o peripheral neuropathy, prod cough with clear mucus, pain in right subscapular area.    FU COOPD, Lung cancer with resultant neuropathy, fatigue, etc..   Somethings worse, some better Pain better wit multiple drugs. Did not tolerate cymbalta and lyrica Fatigue, weight loss, ECOG all worse - ECOG 4 now - sepnds most of day in bed  Struggling with illness coping. Knows she is failing to thrive. Attributes this to loss of home PT. Feels Nivolumuab only hope. Latest dose yesterday with DR [facility]; apparently told disease is stable. Wants to go on with chemo. Friend sees her failing and refusing to accept help.  She is in obvious denial partly about her failing health.  Discused hospice benefit when no longer chemo candidate - she feels she is not ready for hospice. Knows that hospitce is an option when things fail but she despite ECOG 3-4 does not she herself as being hospice eligible. . Discussed her advanced care plan with her: her advance care planning is that she move in with her friend Mr Elnora Morrison to assisted living so she can take care of him.  Met with pall care yuesterday: did not enjoy visit. Says she knows all the "pastorial stuff" can do job of [name] herself  Symptoms are rated below   [place] Symptom Assessment Numerical Scale 0 is no problem -> 10 worst problem 07/29/2014   08/27/2014  09/30/2014   No Pain -> Worst pain 6, in mid scapula, left shoulder, buttock and thigh and feet - did have platin neuropathy 8.5 7, some better  No Tiredness -> Worset tiredness 9 7 9, worse  No Nausea -> Worst nausea 1 0 9, worse  No Depression -> Worst depression 4 9 9, same  No Anxiety -> Worst Anxiety 6 9 7, same  No Drowsiness -> Worst Drowsiness 10 7 9, worse  Best appetite-> Worst Appetitle '7 10 8 ' same  Best Feeling of well being -> Worst feeling 1 6 7, worse  No dyspnea-> Worst dyspnea 9 8 4,  better  Other problem (none -> severe) none Tachycardia - Physical therapy refuses to work wit her. Now in AECOPD with wheeze on exam 10 nos  Completed by  MR/patent MR/patient patient  comments  All worse Some better, some worse    Review of Systems  Constitutional: Positive for fatigue. Negative for fever and unexpected weight change.  HENT: Positive for congestion. Negative for dental problem, ear pain, nosebleeds, postnasal drip, rhinorrhea, sinus pressure, sneezing, sore throat and trouble swallowing.   Eyes: Negative for redness and itching.  Respiratory: Positive for cough and shortness of breath. Negative for chest tightness and wheezing.   Cardiovascular: Negative for palpitations and leg swelling.  Gastrointestinal: Negative for nausea and vomiting.  Genitourinary: Negative for dysuria.  Musculoskeletal: Negative for joint swelling.  Skin: Negative for rash.  Neurological: Negative for headaches.  Hematological: Does not bruise/bleed easily.  Psychiatric/Behavioral: Negative for dysphoric mood. The patient is not nervous/anxious.        Objective:   Physical Exam   Filed Vitals:   09/30/14 1059  BP: 102/62  Pulse: 96  Height: '5\' 6"'  (1.676 m)  Weight: 138 lb (62.596 kg)  SpO2: 97%    hysical Exam  Vitals reviewed. Constitutional: She is oriented to person, place, and time. She appears well-developed and well-nourished. No distress.  . Body mass index is 23.58 kg/(m^2).  Body mass index is 22.28 kg/(m^2). 09/30/2014  ecog now3-4, sitting in wheel chair Looks much weaker - Has a cane  HENT:  Head: Normocephalic and atraumatic.  Right Ear: External ear normal.  Left Ear: External ear normal.  Mouth/Throat: Oropharynx is clear and moist. No oropharyngeal exudate.  Wig +  Eyes: Conjunctivae and EOM are normal. Pupils are equal, round, and reactive to light. Right eye exhibits no discharge. Left eye exhibits no discharge. No scleral icterus.  Neck: Normal range  of motion. Neck supple. No JVD present. No tracheal deviation present. No thyromegaly present.  Cardiovascular: Normal rate, regular rhythm, normal heart sounds and intact distal pulses.  Exam reveals no gallop and no friction rub.   No murmur heard. Pulmonary/Chest: Effort normal and breath sounds normal. No respiratory distress. She has no wheezes. She has no rales. She exhibits no tenderness.  Abdominal: Soft. Bowel sounds are normal. She exhibits no distension and no mass. There is no tenderness. There is no rebound and no guarding.  Musculoskeletal: Normal range of motion. She exhibits no edema and no tenderness.  Lymphadenopathy:    She has no cervical adenopathy.  Neurological: She is alert and oriented to person, place, and time. She has normal reflexes. No cranial nerve deficit. She exhibits normal muscle tone. Coordination normal.  Skin: Skin is warm and dry. No rash noted. She is not diaphoretic. No erythema. No pallor.  Psychiatric: She has a normal mood and affect. Her behavior is normal. Judgment and thought content normal.        Assessment & Plan:  #COPD exacerbation  - resolved  #Fatigue and anorexia  #PEripheral neuropathy  - this is due to pripor chemo cycle with plantinum #Depression    - refer Home PT  -we discussed home hospice options when chemo no longer works: please keep this in mind   #Followup  -  I will see you back in 6  Weeks    > 50% of this > 25 min visit spent in face to face counseling or coordination of care (15 min visit converted to 25 min)   Dr. Brand Males, M.D., [facility].C.P Pulmonary and Critical Care Medicine Staff Physician [facility] Pager: [phone], If no answer or between  15:00h - 7:00h: call [phone]  10/04/2014 5:15 AM

## 2014-10-01 ENCOUNTER — Ambulatory Visit (INDEPENDENT_AMBULATORY_CARE_PROVIDER_SITE_OTHER): Payer: Medicare Other | Admitting: Gastroenterology

## 2014-10-01 ENCOUNTER — Encounter: Payer: Self-pay | Admitting: Gastroenterology

## 2014-10-01 VITALS — BP 92/60 | HR 64 | Ht 66.0 in | Wt 136.4 lb

## 2014-10-01 DIAGNOSIS — K222 Esophageal obstruction: Secondary | ICD-10-CM | POA: Insufficient documentation

## 2014-10-01 MED ORDER — LANSOPRAZOLE 30 MG PO TBDP: 30.0000 mg | ORAL_TABLET | Freq: Every day | ORAL | Status: DC

## 2014-10-01 NOTE — Assessment & Plan Note (Signed)
Patient is symptomatic from the esophageal stricture.  Recommendations #1 begin Prevacid solution #2 upper endoscopy with Savary dilation under fluoroscopic guidance  Risks, alternatives, and complications of the procedure, including bleeding, perforation, and possible need for surgery, were explained to the patient.  Patient's questions were answered.

## 2014-10-01 NOTE — Assessment & Plan Note (Signed)
Further therapy per oncology

## 2014-10-01 NOTE — Addendum Note (Signed)
Addended by: Oda Kilts on: 10/01/2014 03:37 PM   Modules accepted: Orders

## 2014-10-01 NOTE — Progress Notes (Signed)
      History of Present Illness:  [name] is a 73 year old white female with stage IIIB squamous cell CA of the lung, status post r chemotherapy and adiation therapy, here for reevaluation of dysphagia.  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  Endoscopy was complicated by a superficial mucosal tear by the scope and dilation was not performed.  Patient continues to complain of dysphagia which has now worsened.  She has dysphagia to solids only.  She denies odynophagia.  She also complains of occasional spontaneous pain in the upper midepigastrium.    Review of Systems: Pertinent positive and negative review of systems were noted in the above HPI section. All other review of systems were otherwise negative.    Current Medications, Allergies, Past Medical History, Past Surgical History, Family History and Social History were reviewed in [facility] record  Vital signs were reviewed in today's medical record. Physical Exam: General: Chronically ill-appearing no acute distress Skin: anicteric Head: Normocephalic and atraumatic Eyes:  sclerae anicteric, EOMI Ears: Normal auditory acuity Mouth: No deformity or lesions Lungs: A few inspiratory and expiratory wheezes Heart: Regular rate and rhythm; no murmurs, rubs or bruits Abdomen: Soft, non tender and non distended. No masses, hepatosplenomegaly or hernias noted. Normal Bowel sounds Rectal:deferred Musculoskeletal: Symmetrical with no gross deformities  Pulses:  Normal pulses noted Extremities: No clubbing, cyanosis, edema or deformities noted Neurological: Alert oriented x 4, grossly nonfocal Psychological:  Alert and cooperative. Normal mood and affect  See Assessment and Plan under Problem List

## 2014-10-02 ENCOUNTER — Ambulatory Visit (INDEPENDENT_AMBULATORY_CARE_PROVIDER_SITE_OTHER): Payer: Medicare Other | Admitting: Podiatry

## 2014-10-02 ENCOUNTER — Telehealth: Payer: Self-pay | Admitting: Internal Medicine

## 2014-10-02 ENCOUNTER — Telehealth: Payer: Self-pay | Admitting: *Deleted

## 2014-10-02 VITALS — BP 92/60 | HR 64 | Resp 20 | Ht 66.0 in | Wt 136.0 lb

## 2014-10-02 DIAGNOSIS — E1142 Type 2 diabetes mellitus with diabetic polyneuropathy: Secondary | ICD-10-CM

## 2014-10-02 NOTE — Progress Notes (Signed)
   Subjective:    Patient ID: Megan [name], female    DOB: 04-May-1941, 73 y.o.   MRN: [id number]  HPI Comments: Burning and tingling and shooting pain. i have neuropathy in my feet. i was diagnosed 3 months ago .  My neurologist put me on medication for it. i also would like him to cut my toenails,      Review of Systems  Endocrine:       Diabetes   All other systems reviewed and are negative.      Objective:   Physical Exam I have reviewed her past medical history medications allergies surgery social history lab work and review of systems. Pulses are barely palpable bilateral capillary refill time is immediate to toes 1 through 5 bilateral. Neurologic sensorium is decreased percent as well as a monofilament to the level of the midfoot. Deep tendon reflexes are not elicitable. The pain receptors are elicitable. Muscle strength is about a 3 out of 4 dorsiflexors plantar flexors and inverters and everters. Orthopedic evaluation demonstrates rectus foot mild hammertoe deformities bilateral. Cutaneous evaluation demonstrates thin friable skin. No lesions noted.        Assessment & Plan:  Assessment: Severe uncontrolled diabetes based on lab reports a greater than 400 mg/dL of blood sugar. Severe diabetic sensory and motor neuropathy some of this I am sure is associated with chemotherapeutic agents but with the latest decline I feel that this is primarily due to hyperglycemia.  Plan: I discussed with her in great detail today the etiology pathology conservative versus surgical therapies. We discussed in great detail diabetes and diabetic peripheral neuropathy as well as hallux progresses. I did suggest that we consider a pair of diabetic shoes primarily because of her peripheral vascular disease as well as her peripheral neuropathy. I also encouraged her to start taking her gabapentin which she had discontinued. I will follow up with her once her diabetic shoes return.

## 2014-10-02 NOTE — Telephone Encounter (Signed)
Called patient to inform EGD at [facility] on 12/10 at 12:30pm  Explained to pt all instructions

## 2014-10-02 NOTE — Telephone Encounter (Signed)
Called and informed patient of elevated blood sugar.  Patient stated she was aware of elevated blood sugar and is taking metformin twice daily.  Informed patient she may need to see her pcp for review of her diabetes medication. Per Awilda Metro, PA.  Patient stated she would contact pcp. Called and informed patient's PCP of elevated blood sugar.

## 2014-10-02 NOTE — Telephone Encounter (Signed)
-----   Message from Carlton Adam, PA-C sent at 10/02/2014  9:44 AM EST ----- Abnormal results, please call  and notify patient of elevated blood sugar. Make sure she is taking her metformin. She may need to see her primary care physician for review of her diabetes medications

## 2014-10-02 NOTE — Telephone Encounter (Signed)
Noted and will forward to MR as an [place]

## 2014-10-03 ENCOUNTER — Telehealth: Payer: Self-pay | Admitting: Internal Medicine

## 2014-10-03 NOTE — Telephone Encounter (Signed)
Noted and will forward to MR as an [place]

## 2014-10-06 ENCOUNTER — Other Ambulatory Visit: Payer: Self-pay | Admitting: Medical Oncology

## 2014-10-06 DIAGNOSIS — C3490 Malignant neoplasm of unspecified part of unspecified bronchus or lung: Secondary | ICD-10-CM

## 2014-10-06 MED ORDER — IBUPROFEN 600 MG PO TABS: 600.0000 mg | ORAL_TABLET | Freq: Four times a day (QID) | ORAL | Status: DC | PRN

## 2014-10-08 ENCOUNTER — Encounter (HOSPITAL_COMMUNITY): Payer: Self-pay | Admitting: *Deleted

## 2014-10-08 NOTE — Progress Notes (Signed)
Pt was contacted and pre-op assessment was completed. Please disregard previous note.

## 2014-10-08 NOTE — Progress Notes (Signed)
Several unsuccessful attempts were made to contact pt. Pt phone had a busy signal with each attempt. Pt emergency contact was attempted; lvm but received no call back. A voice message was left on the Endo answering machine.

## 2014-10-09 ENCOUNTER — Ambulatory Visit (HOSPITAL_COMMUNITY)
Admission: RE | Admit: 2014-10-09 | Discharge: 2014-10-09 | Disposition: A | Discharge status: Home / Self Care | Payer: Medicare Other | Source: Ambulatory Visit | Attending: Gastroenterology | Admitting: Gastroenterology

## 2014-10-09 ENCOUNTER — Encounter (HOSPITAL_COMMUNITY): Payer: Self-pay | Admitting: *Deleted

## 2014-10-09 ENCOUNTER — Ambulatory Visit (HOSPITAL_COMMUNITY): Payer: Medicare Other | Admitting: Anesthesiology

## 2014-10-09 ENCOUNTER — Ambulatory Visit (HOSPITAL_COMMUNITY): Payer: Medicare Other

## 2014-10-09 ENCOUNTER — Encounter (HOSPITAL_COMMUNITY)
Admission: RE | Disposition: A | Discharge status: Home / Self Care | Payer: Self-pay | Source: Ambulatory Visit | Attending: Gastroenterology

## 2014-10-09 DIAGNOSIS — R1310 Dysphagia, unspecified: Secondary | ICD-10-CM | POA: Diagnosis present

## 2014-10-09 DIAGNOSIS — E119 Type 2 diabetes mellitus without complications: Secondary | ICD-10-CM | POA: Diagnosis not present

## 2014-10-09 DIAGNOSIS — K222 Esophageal obstruction: Secondary | ICD-10-CM | POA: Insufficient documentation

## 2014-10-09 DIAGNOSIS — Z87891 Personal history of nicotine dependence: Secondary | ICD-10-CM | POA: Diagnosis not present

## 2014-10-09 DIAGNOSIS — I10 Essential (primary) hypertension: Secondary | ICD-10-CM | POA: Diagnosis not present

## 2014-10-09 DIAGNOSIS — K219 Gastro-esophageal reflux disease without esophagitis: Secondary | ICD-10-CM | POA: Diagnosis not present

## 2014-10-09 DIAGNOSIS — C3490 Malignant neoplasm of unspecified part of unspecified bronchus or lung: Secondary | ICD-10-CM | POA: Diagnosis not present

## 2014-10-09 HISTORY — DX: Personal history of other diseases of the digestive system: Z87.19

## 2014-10-09 HISTORY — PX: SAVORY DILATION: SHX5439

## 2014-10-09 HISTORY — DX: Reserved for inherently not codable concepts without codable children: IMO0001

## 2014-10-09 HISTORY — PX: ESOPHAGOGASTRODUODENOSCOPY (EGD) WITH PROPOFOL: SHX5813

## 2014-10-09 LAB — GLUCOSE, CAPILLARY: GLUCOSE-CAPILLARY: 312 mg/dL — AB (ref 70–99)

## 2014-10-09 LAB — POCT I-STAT 4, (NA,K, GLUC, HGB,HCT)
Glucose, Bld: 290 mg/dL — ABNORMAL HIGH (ref 70–99)
HEMATOCRIT: 40 % (ref 36.0–46.0)
Hemoglobin: 13.6 g/dL (ref 12.0–15.0)
Potassium: 3.7 mEq/L (ref 3.7–5.3)
Sodium: 138 mEq/L (ref 137–147)

## 2014-10-09 SURGERY — ESOPHAGOGASTRODUODENOSCOPY (EGD) WITH PROPOFOL
Anesthesia: Monitor Anesthesia Care

## 2014-10-09 MED ORDER — LACTATED RINGERS IV SOLN
INTRAVENOUS | Status: DC | PRN
  Administered 2014-10-09: 14:00:00 via INTRAVENOUS

## 2014-10-09 MED ORDER — MIDAZOLAM HCL 5 MG/5ML IJ SOLN
INTRAMUSCULAR | Status: DC | PRN
  Administered 2014-10-09 (×2): 1 mg via INTRAVENOUS

## 2014-10-09 MED ORDER — PROPOFOL 10 MG/ML IV BOLUS
INTRAVENOUS | Status: DC | PRN
  Administered 2014-10-09: 20 mg via INTRAVENOUS
  Administered 2014-10-09: 10 mg via INTRAVENOUS
  Administered 2014-10-09 (×2): 20 mg via INTRAVENOUS

## 2014-10-09 MED ORDER — LACTATED RINGERS IV SOLN
INTRAVENOUS | Status: DC
  Administered 2014-10-09: 1000 mL via INTRAVENOUS

## 2014-10-09 MED ORDER — SODIUM CHLORIDE 0.9 % IV SOLN: INTRAVENOUS | Status: DC

## 2014-10-09 NOTE — Op Note (Signed)
[facility], [id number]   ENDOSCOPY WITH DILATION PROCEDURE REPORT  PATIENT: Megan [name], Megan [name]  MR#: [id number] BIRTHDATE: January 08, 1941 , 73  yrs. old GENDER: female ENDOSCOPIST: Inda Castle, MD ASSISTANT:   Horace Porteous REFERRED ZS:MOLMBEM Julien Nordmann, M.D. PROCEDURE DATE:  2014/10/17 PROCEDURE:   EGD w/ wire guided (savary) dilation - dilation of a radiation?"induced esophageal stricture ASA CLASS:   Class III INDICATIONS:therapeutic procedure. MEDICATIONS: Monitored anesthesia care TOPICAL ANESTHETIC:  DESCRIPTION OF PROCEDURE:   After the risks benefits and alternatives of the procedure were thoroughly explained, informed consent was obtained.  The     endoscope was introduced through the mouth  and advanced to the second portion of the duodenum , limited by Without limitations.   The instrument was slowly withdrawn as the mucosa was carefully examined.    ESOPHAGUS: There was a 5cm long fibrotic stricture 30 cm from the incisors.  The stricture was easily traversable.   Except for the findings listed the EGD was otherwise normal.  Dilation was then performed at the mid esophagus  Dilator:Savary over guidewire Size:12.06-12-13-65mm Reststance:moderate Heme:yes  There was minimal heme on the 15 mm dilator.  Dilators were passed under fluoroscopic guidance  COMPLICATIONS: There were no immediate complications.  ENDOSCOPIC IMPRESSION: 1.   There was a 5cm long stricture 30 cm from the incisors - status post redilation 2.   EGD was otherwise normal  RECOMMENDATIONS: repeat dilation in 2-3 weeks  eSigned:  Inda Castle, MD Oct 17, 2014 1:59 PM  CC:  CPT CODES: ICD CODES:  The ICD and CPT codes recommended by this software are interpretations from the data that the clinical staff has captured with the software.  The verification of the translation of this report to the ICD and  CPT codes and modifiers is the sole responsibility of the health care institution and practicing physician where this report was generated.  [place]. will not be held responsible for the validity of the ICD and CPT codes included on this report.  AMA assumes no liability for data contained or not contained herein. CPT is a Designer, television/film set of the [employer].

## 2014-10-09 NOTE — Anesthesia Preprocedure Evaluation (Signed)
Anesthesia Evaluation  Patient identified by MRN, date of birth, ID band Patient awake    Reviewed: Allergy & Precautions, H&P , NPO status , Patient's Chart, lab work & pertinent test results  Airway Mallampati: II  TM Distance: >3 FB Neck ROM: Full    Dental   Pulmonary shortness of breath, former smoker,  breath sounds clear to auscultation        Cardiovascular hypertension, Rhythm:Regular Rate:Normal     Neuro/Psych    GI/Hepatic Neg liver ROS, hiatal hernia, GERD-  ,  Endo/Other  diabetes  Renal/GU Renal disease     Musculoskeletal   Abdominal   Peds  Hematology   Anesthesia Other Findings   Reproductive/Obstetrics                             Anesthesia Physical Anesthesia Plan  ASA: III  Anesthesia Plan: MAC   Post-op Pain Management:    Induction: Intravenous  Airway Management Planned: Simple Face Mask  Additional Equipment:   Intra-op Plan:   Post-operative Plan:   Informed Consent: I have reviewed the patients History and Physical, chart, labs and discussed the procedure including the risks, benefits and alternatives for the proposed anesthesia with the patient or authorized representative who has indicated his/her understanding and acceptance.     Plan Discussed with: CRNA and Anesthesiologist  Anesthesia Plan Comments:         Anesthesia Quick Evaluation

## 2014-10-09 NOTE — Interval H&P Note (Signed)
History and Physical Interval Note:  10/09/2014 12:26 PM  Megan [name]  has presented today for surgery, with the diagnosis of dysphagia  The various methods of treatment have been discussed with the patient and family. After consideration of risks, benefits and other options for treatment, the patient has consented to  Procedure(s): ESOPHAGOGASTRODUODENOSCOPY (EGD) WITH PROPOFOL (N/A) SAVORY DILATION (N/A) as a surgical intervention .  The patient's history has been reviewed, patient examined, no change in status, stable for surgery.  I have reviewed the patient's chart and labs.  Questions were answered to the patient's satisfaction.     The recent H&P (dated *10/09/14**) was reviewed, the patient was examined and there is no change in the patients condition since that H&P was completed.   Erskine Emery  10/09/2014, 12:27 PM   Erskine Emery

## 2014-10-09 NOTE — Anesthesia Postprocedure Evaluation (Signed)
  Anesthesia Post-op Note  Patient: Megan [name]  Procedure(s) Performed: Procedure(s): ESOPHAGOGASTRODUODENOSCOPY (EGD) WITH PROPOFOL (N/A) SAVORY DILATION (N/A)  Patient Location: PACU and Endoscopy Unit  Anesthesia Type:MAC  Level of Consciousness: awake  Airway and Oxygen Therapy: Patient Spontanous Breathing  Post-op Pain: mild  Post-op Assessment: Post-op Vital signs reviewed  Post-op Vital Signs: Reviewed  Last Vitals:  Filed Vitals:   10/09/14 1424  BP: 122/81  Pulse: 79  Temp:   Resp: 19    Complications: No apparent anesthesia complications

## 2014-10-09 NOTE — Transfer of Care (Signed)
Immediate Anesthesia Transfer of Care Note  Patient: Megan [name]  Procedure(s) Performed: Procedure(s): ESOPHAGOGASTRODUODENOSCOPY (EGD) WITH PROPOFOL (N/A) SAVORY DILATION (N/A)  Patient Location: PACU and Endoscopy Unit  Anesthesia Type:MAC  Level of Consciousness: awake and alert   Airway & Oxygen Therapy: Patient Spontanous Breathing and Patient connected to nasal cannula oxygen  Post-op Assessment: Report given to PACU RN and Post -op Vital signs reviewed and stable  Post vital signs: Reviewed and stable  Complications: No apparent anesthesia complications

## 2014-10-09 NOTE — Discharge Instructions (Signed)
Esophagogastroduodenoscopy °Care After °Refer to this sheet in the next few weeks. These instructions provide you with information on caring for yourself after your procedure. Your caregiver may also give you more specific instructions. Your treatment has been planned according to current medical practices, but problems sometimes occur. Call your caregiver if you have any problems or questions after your procedure.  °HOME CARE INSTRUCTIONS °· Do not eat or drink anything until the numbing medicine (local anesthetic) has worn off and your gag reflex has returned. You will know that the local anesthetic has worn off when you can swallow comfortably. °· Do not drive for 12 hours after the procedure or as directed by your caregiver. °· Only take medicines as directed by your caregiver. °SEEK MEDICAL CARE IF:  °· You cannot stop coughing. °· You are not urinating at all or less than usual. °SEEK IMMEDIATE MEDICAL CARE IF: °· You have difficulty swallowing. °· You cannot eat or drink. °· You have worsening throat or chest pain. °· You have dizziness, lightheadedness, or you faint. °· You have nausea or vomiting. °· You have chills. °· You have a fever. °· You have severe abdominal pain. °· You have black, tarry, or bloody stools. °Document Released: 10/03/2012 Document Reviewed: 10/03/2012 °ExitCare® Patient Information ©2015 [employer]. This information is not intended to replace advice given to you by your health care provider. Make sure you discuss any questions you have with your health care provider. ° °

## 2014-10-09 NOTE — H&P (View-Only) (Signed)
      History of Present Illness:  Megan [name] is a 73 year old white female with stage IIIB squamous cell CA of the lung, status post r chemotherapy and adiation therapy, here for reevaluation of dysphagia.  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  Endoscopy was complicated by a superficial mucosal tear by the scope and dilation was not performed.  Patient continues to complain of dysphagia which has now worsened.  She has dysphagia to solids only.  She denies odynophagia.  She also complains of occasional spontaneous pain in the upper midepigastrium.    Review of Systems: Pertinent positive and negative review of systems were noted in the above HPI section. All other review of systems were otherwise negative.    Current Medications, Allergies, Past Medical History, Past Surgical History, Family History and Social History were reviewed in [facility] record  Vital signs were reviewed in today's medical record. Physical Exam: General: Chronically ill-appearing no acute distress Skin: anicteric Head: Normocephalic and atraumatic Eyes:  sclerae anicteric, EOMI Ears: Normal auditory acuity Mouth: No deformity or lesions Lungs: A few inspiratory and expiratory wheezes Heart: Regular rate and rhythm; no murmurs, rubs or bruits Abdomen: Soft, non tender and non distended. No masses, hepatosplenomegaly or hernias noted. Normal Bowel sounds Rectal:deferred Musculoskeletal: Symmetrical with no gross deformities  Pulses:  Normal pulses noted Extremities: No clubbing, cyanosis, edema or deformities noted Neurological: Alert oriented x 4, grossly nonfocal Psychological:  Alert and cooperative. Normal mood and affect  See Assessment and Plan under Problem List

## 2014-10-10 ENCOUNTER — Encounter (HOSPITAL_COMMUNITY): Payer: Self-pay | Admitting: Gastroenterology

## 2014-10-13 ENCOUNTER — Ambulatory Visit (HOSPITAL_BASED_OUTPATIENT_CLINIC_OR_DEPARTMENT_OTHER): Payer: Medicare Other | Admitting: Physician Assistant

## 2014-10-13 ENCOUNTER — Encounter: Payer: Self-pay | Admitting: Physician Assistant

## 2014-10-13 ENCOUNTER — Other Ambulatory Visit (HOSPITAL_BASED_OUTPATIENT_CLINIC_OR_DEPARTMENT_OTHER): Payer: Medicare Other

## 2014-10-13 ENCOUNTER — Ambulatory Visit (HOSPITAL_BASED_OUTPATIENT_CLINIC_OR_DEPARTMENT_OTHER): Payer: Medicare Other

## 2014-10-13 ENCOUNTER — Telehealth: Payer: Self-pay | Admitting: Physician Assistant

## 2014-10-13 ENCOUNTER — Telehealth: Payer: Self-pay | Admitting: Internal Medicine

## 2014-10-13 VITALS — BP 115/51 | HR 87 | Temp 97.9°F | Resp 19 | Ht 66.0 in | Wt 134.5 lb

## 2014-10-13 DIAGNOSIS — C342 Malignant neoplasm of middle lobe, bronchus or lung: Secondary | ICD-10-CM

## 2014-10-13 DIAGNOSIS — M069 Rheumatoid arthritis, unspecified: Secondary | ICD-10-CM

## 2014-10-13 DIAGNOSIS — G629 Polyneuropathy, unspecified: Secondary | ICD-10-CM

## 2014-10-13 DIAGNOSIS — C3491 Malignant neoplasm of unspecified part of right bronchus or lung: Secondary | ICD-10-CM

## 2014-10-13 DIAGNOSIS — R5383 Other fatigue: Secondary | ICD-10-CM

## 2014-10-13 DIAGNOSIS — Z79899 Other long term (current) drug therapy: Secondary | ICD-10-CM

## 2014-10-13 DIAGNOSIS — Z5112 Encounter for antineoplastic immunotherapy: Secondary | ICD-10-CM

## 2014-10-13 DIAGNOSIS — E119 Type 2 diabetes mellitus without complications: Secondary | ICD-10-CM

## 2014-10-13 LAB — COMPREHENSIVE METABOLIC PANEL (CC13)
ALBUMIN: 3.5 g/dL (ref 3.5–5.0)
ALT: 20 U/L (ref 0–55)
AST: 15 U/L (ref 5–34)
Alkaline Phosphatase: 85 U/L (ref 40–150)
Anion Gap: 13 mEq/L — ABNORMAL HIGH (ref 3–11)
BUN: 11.5 mg/dL (ref 7.0–26.0)
CHLORIDE: 100 meq/L (ref 98–109)
CO2: 23 mEq/L (ref 22–29)
Calcium: 9.3 mg/dL (ref 8.4–10.4)
Creatinine: 0.9 mg/dL (ref 0.6–1.1)
EGFR: 68 mL/min/{1.73_m2} — ABNORMAL LOW (ref >90)
Glucose: 362 mg/dl — ABNORMAL HIGH (ref 70–140)
Potassium: 3.6 mEq/L (ref 3.5–5.1)
Sodium: 136 mEq/L (ref 136–145)
Total Bilirubin: 0.5 mg/dL (ref 0.20–1.20)
Total Protein: 6 g/dL — ABNORMAL LOW (ref 6.4–8.3)

## 2014-10-13 LAB — CBC WITH DIFFERENTIAL/PLATELET
BASO%: 0.3 % (ref 0.0–2.0)
Basophils Absolute: 0 10*3/uL (ref 0.0–0.1)
EOS ABS: 0 10*3/uL (ref 0.0–0.5)
EOS%: 0 % (ref 0.0–7.0)
HCT: 40.9 % (ref 34.8–46.6)
HGB: 13.4 g/dL (ref 11.6–15.9)
LYMPH%: 12.4 % — ABNORMAL LOW (ref 14.0–49.7)
MCH: 29 pg (ref 25.1–34.0)
MCHC: 32.9 g/dL (ref 31.5–36.0)
MCV: 88.3 fL (ref 79.5–101.0)
MONO#: 0.5 10*3/uL (ref 0.1–0.9)
MONO%: 7.9 % (ref 0.0–14.0)
NEUT#: 5 10*3/uL (ref 1.5–6.5)
NEUT%: 79.4 % — ABNORMAL HIGH (ref 38.4–76.8)
PLATELETS: 177 10*3/uL (ref 145–400)
RBC: 4.63 10*6/uL (ref 3.70–5.45)
RDW: 16 % — AB (ref 11.2–14.5)
WBC: 6.3 10*3/uL (ref 3.9–10.3)
lymph#: 0.8 10*3/uL — ABNORMAL LOW (ref 0.9–3.3)

## 2014-10-13 LAB — TSH CHCC: TSH: 1.402 m[IU]/L (ref 0.308–3.960)

## 2014-10-13 MED ORDER — SODIUM CHLORIDE 0.9 % IV SOLN
Freq: Once | INTRAVENOUS | Status: AC
  Administered 2014-10-13: 13:00:00 via INTRAVENOUS

## 2014-10-13 MED ORDER — SODIUM CHLORIDE 0.9 % IV SOLN
200.0000 mg | Freq: Once | INTRAVENOUS | Status: AC
  Administered 2014-10-13: 200 mg via INTRAVENOUS
  Filled 2014-10-13: qty 20

## 2014-10-13 MED ORDER — SODIUM CHLORIDE 0.9 % IV SOLN: 3.0000 mg/kg | Freq: Once | INTRAVENOUS | Status: DC

## 2014-10-13 NOTE — Telephone Encounter (Signed)
Please advise MR if okay to do so? thanks

## 2014-10-13 NOTE — Telephone Encounter (Signed)
Yes that is fine for PT

## 2014-10-13 NOTE — Telephone Encounter (Signed)
Pt confirmed labs/ov per 12/14 POF, gave pt AVS.... KJ, chemo already added for the 28th, had to schedule pt labs/ov on the 24th books closed no availability during ML afternoon apts...Marland KitchenMarland Kitchen

## 2014-10-13 NOTE — Patient Instructions (Signed)
[facility] Discharge Instructions for Patients Receiving Chemotherapy  Today you received the following chemotherapy agents: Nivolumab  To help prevent nausea and vomiting after your treatment, we encourage you to take your nausea medication: as directed.   If you develop nausea and vomiting that is not controlled by your nausea medication, call the clinic.   BELOW ARE SYMPTOMS THAT SHOULD BE REPORTED IMMEDIATELY:  *FEVER GREATER THAN 100.5 F  *CHILLS WITH OR WITHOUT FEVER  NAUSEA AND VOMITING THAT IS NOT CONTROLLED WITH YOUR NAUSEA MEDICATION  *UNUSUAL SHORTNESS OF BREATH  *UNUSUAL BRUISING OR BLEEDING  TENDERNESS IN MOUTH AND THROAT WITH OR WITHOUT PRESENCE OF ULCERS  *URINARY PROBLEMS  *BOWEL PROBLEMS  UNUSUAL RASH Items with * indicate a potential emergency and should be followed up as soon as possible.  Feel free to call the clinic you have any questions or concerns. The clinic phone number is [phone].

## 2014-10-13 NOTE — Patient Instructions (Signed)
Contact Dr. Golden Pop office regarding her physical therapy Follow-up in 2 weeks

## 2014-10-13 NOTE — Progress Notes (Addendum)
[facility] [phone]   [phone]  OFFICE PROGRESS NOTE  [name], MD [address] [place] [address] [place] [address]  DIAGNOSIS: Squamous cell lung cancer  Primary site: Lung (Right)  Staging method: AJCC 7th Edition  Clinical: Stage IIIB (T3, N3, M0)  Summary: Stage IIIB (T3, N3, M0)   PRIOR THERAPY:  1) Concurrent chemoradiation with weekly chemotherapy in the form of carboplatin for AUC of 2 and paclitaxel 45 mg/m2. Status post 7 week of treatment. Last dose was given 01/27/2014 no significant response in her disease.  2) Consolidation chemotherapy with carboplatin for AUC of 5 and paclitaxel 175 mg/M2 every 3 weeks with Neulasta support. First dose on 04/15/2014. Status post 3 cycles. Carboplatin was discontinued starting cycle #2 secondary to hypersensitivity reaction.  CURRENT THERAPY: Immunotherapy with Opdivo (Nivolumab) 3mg /kg given every 2 weeks. Status post 7 cycles.  CHEMOTHERAPY INTENT: Control/Palliative  CURRENT # OF CHEMOTHERAPY CYCLES: 8 CURRENT ANTIEMETICS: Zofran, dexamethasone, Compazine  CURRENT SMOKING STATUS: Former smoker, quit 12/05/1997  ORAL CHEMOTHERAPY AND CONSENT: n/a  CURRENT BISPHOSPHONATES USE: None  PAIN MANAGEMENT: no cancer related pain, patient does have RA  NARCOTICS INDUCED CONSTIPATION: none  LIVING WILL AND CODE STATUS: ?   INTERVAL HISTORY: Megan [name] 73 y.o. female returns to the clinic today for followup visit accompanied by a friend. The patient is doing fine today was no specific complaints except for the persistent peripheral neuropathy more in the toes than the fingers. She tried Cymbalta but she was unable to tolerate it. She is currently on gabapentin 600 mg by mouth twice a day. She reports some improvement in her peripheral neuropathy, recently being able to make a fist with her hands. She notes some diarrhea, no more than 3 episodes a day, not occurring every day  the week and she feels this is this primarily related to her metformin. She denied any blood being in her stool. She has lost some weight but reports that she is eating well. This also may be related to her metformin and diabetes diagnosis. She notes some increasing difficulty with peripheral venous access. Should she continue on treatment for an extended period of time she may require a Port-A-Cath in the future. She reviewed states that Dr. Chase Caller had ordered some home physical therapy for her. She states that the therapists only comes once a week and she feels that she needs something more intensive. I advised her to contact Dr. Golden Pop office as he is the one that initially ordered the physical therapy. She continues to have some arthralgias and myalgias and muscle weakness. This may be related to her rheumatoid arthritis. The patient denied having any significant chest pain but has shortness breath with exertion with no cough or hemoptysis. She denied having any significant weight loss or night sweats. She has no nausea or vomiting. She presents to proceed with cycle #8 of her immunotherapy with Nivolumab.  MEDICAL HISTORY: Past Medical History  Diagnosis Date  . Hypertension   . Diabetes mellitus   . Rheumatoid arthritis   . GERD (gastroesophageal reflux disease)     no meds for  . Hx of radiation therapy 12/16/13-01/30/14    lung 66Gy  . Cancer     LUNG  . Neuropathy   . Shortness of breath dyspnea     with exertion  . History of hiatal hernia     ALLERGIES:  is allergic to remicade; carboplatin; and hydromorphone.  MEDICATIONS:  Current  Outpatient Prescriptions  Medication Sig Dispense Refill  . dronabinol (MARINOL) 10 MG capsule Take 1 capsule (10 mg total) by mouth 2 (two) times daily before a meal. 60 capsule 4  . Fluticasone-Salmeterol (ADVAIR DISKUS) 100-50 MCG/DOSE AEPB Inhale 1 puff into the lungs 2 (two) times daily. 60 each 5  . Gabapentin, Once-Daily, (GRALISE) 600 MG  TABS Take 1,800 mg by mouth daily. 90 tablet 6  . Gabapentin, PHN, (GRALISE STARTER) 300 & 600 MG MISC Take 1,800 mg by mouth at bedtime. 1 each 1  . lidocaine (LIDODERM) 5 % PLACE 3 PATCHES ONTO THE SKIN EVERY DAY. REMOVE AND DISCARD PATCH 12 HOURS LATER. ON FOR 12 HOURS, OFF FOR 12 HOURS. MAY CUT PATCH TO FIT 90 patch 1  . meloxicam (MOBIC) 15 MG tablet Take 15 mg by mouth daily.    . metFORMIN (GLUCOPHAGE) 500 MG tablet Take 1 tablet (500 mg total) by mouth 2 (two) times daily with a meal. 60 tablet 1  . potassium chloride SA (K-DUR,KLOR-CON) 20 MEQ tablet Take 40 mEq by mouth daily.    . prochlorperazine (COMPAZINE) 10 MG tablet Take 10 mg by mouth every 6 (six) hours as needed for nausea or vomiting.    [name] zolpidem (AMBIEN) 10 MG tablet Take 10 mg by mouth at bedtime as needed for sleep.    . Biotin 5000 MCG CAPS Take 1 capsule by mouth daily.     [name] docusate sodium (COLACE) 100 MG capsule Take 1 capsule (100 mg total) by mouth every 12 (twelve) hours. (Patient not taking: Reported on 10/13/2014) 60 capsule 0  . ibuprofen (ADVIL,MOTRIN) 600 MG tablet Take 1 tablet (600 mg total) by mouth every 6 (six) hours as needed. Take with food. (Patient not taking: Reported on 10/13/2014) 30 tablet 0  . lansoprazole (PREVACID SOLUTAB) 30 MG disintegrating tablet Take 1 tablet (30 mg total) by mouth daily. (Patient not taking: Reported on 10/13/2014) 90 tablet 3  . losartan-hydrochlorothiazide (HYZAAR) 100-25 MG per tablet Take 1 tablet by mouth daily.    . vitamin B-12 (CYANOCOBALAMIN) 100 MCG tablet Take 100 mcg by mouth daily.     No current facility-administered medications for this visit.   Facility-Administered Medications Ordered in Other Visits  Medication Dose Route Frequency Provider Last Rate Last Dose  . nivolumab (OPDIVO) 200 mg in sodium chloride 0.9 % 100 mL chemo infusion  200 mg Intravenous Once Curt Bears, MD 120 mL/hr at 10/13/14 1342 200 mg at 10/13/14 1342  . sodium chloride 0.9  % injection 10 mL  10 mL Intracatheter PRN Curt Bears, MD   10 mL at 12/30/13 1121    SURGICAL HISTORY:  Past Surgical History  Procedure Laterality Date  . Cholecystectomy    . Abdominal hysterectomy    . Dilation and curettage of uterus    . Abdominal surgery    . Tonsillectomy    . Esophagogastroduodenoscopy (egd) with propofol N/A 10/09/2014    Procedure: ESOPHAGOGASTRODUODENOSCOPY (EGD) WITH PROPOFOL;  Surgeon: Inda Castle, MD;  Location: [facility];  Service: Endoscopy;  Laterality: N/A;  . [place] dilation N/A 10/09/2014    Procedure: SAVORY DILATION;  Surgeon: Inda Castle, MD;  Location: [facility];  Service: Endoscopy;  Laterality: N/A;    REVIEW OF SYSTEMS:  Constitutional: negative Eyes: negative Ears, nose, mouth, throat, and face: negative Respiratory: positive for dyspnea on exertion and At her baseline Cardiovascular: negative Gastrointestinal: positive for diarrhea, dyspepsia and Diarrhea more likely related to her metformin Genitourinary:negative Integument/breast:  negative Hematologic/lymphatic: negative Musculoskeletal:positive for arthralgias, muscle weakness, myalgias and Present prior to her treatment with Nivolumab Neurological: negative Behavioral/Psych: negative Endocrine: negative Allergic/Immunologic: negative   PHYSICAL EXAMINATION: General appearance: alert, cooperative and no distress Head: Normocephalic, without obvious abnormality, atraumatic Neck: no adenopathy, no JVD, supple, symmetrical, trachea midline and thyroid not enlarged, symmetric, no tenderness/mass/nodules Lymph nodes: Cervical, supraclavicular, and axillary nodes normal. Resp: clear to auscultation bilaterally Back: symmetric, no curvature. ROM normal. No CVA tenderness. Cardio: regular rate and rhythm, S1, S2 normal, no murmur, click, rub or gallop GI: soft, non-tender; bowel sounds normal; no masses,  no organomegaly Extremities: extremities normal, atraumatic,  no cyanosis or edema Neurologic: Alert and oriented X 3, normal strength and tone. Normal symmetric reflexes. Normal coordination and gait  ECOG PERFORMANCE STATUS: 1 - Symptomatic but completely ambulatory  Blood pressure 115/51, pulse 87, temperature 97.9 F (36.6 C), temperature source Oral, resp. rate 19, height 5\' 6"  (1.676 m), weight 134 lb 8 oz (61.009 kg).  LABORATORY DATA: Lab Results  Component Value Date   WBC 6.3 10/13/2014   HGB 13.4 10/13/2014   HCT 40.9 10/13/2014   MCV 88.3 10/13/2014   PLT 177 10/13/2014      Chemistry      Component Value Date/Time   NA 136 10/13/2014 1112   NA 138 10/09/2014 1328   K 3.6 10/13/2014 1112   K 3.7 10/09/2014 1328   CL 92* 09/08/2014 1417   CO2 23 10/13/2014 1112   CO2 28 09/08/2014 1417   BUN 11.5 10/13/2014 1112   BUN 15 09/08/2014 1417   CREATININE 0.9 10/13/2014 1112   CREATININE 0.67 09/08/2014 1417   GLU 481* 09/08/2014 1319      Component Value Date/Time   CALCIUM 9.3 10/13/2014 1112   CALCIUM 9.6 09/08/2014 1417   ALKPHOS 85 10/13/2014 1112   ALKPHOS 118* 09/08/2014 1417   AST 15 10/13/2014 1112   AST 17 09/08/2014 1417   ALT 20 10/13/2014 1112   ALT 25 09/08/2014 1417   BILITOT 0.50 10/13/2014 1112   BILITOT 0.4 09/08/2014 1417       RADIOGRAPHIC STUDIES:  ASSESSMENT AND PLAN: This is a very pleasant 73 years old white female with history of stage IIIB non-small cell lung cancer status post a course of concurrent chemoradiation with weekly carboplatin and paclitaxel status post 7 cycles. The patient tolerated her treatment fairly well with no significant adverse effects except for the radiation induced esophagitis.  She was also treated with 3 cycles of consolidation chemotherapy with carboplatin and paclitaxel but carboplatin was discontinued the start of cycle #2 secondary to hypersensitivity reaction. Her restaging scan showed evidence for disease progression and the patient is currently undergoing  immunotherapy with Nivolumab 3 mg/KG every 2 weeks is status post 7 cycles. The patient was discussed with and also seen by Dr. Julien Nordmann. She will proceed with cycle #8 today as scheduled. For the peripheral neuropathy, she will continue treatment with Neurontin and Percocet.  For the peripheral neuropathy she will continue on ibuprofen to 600 mg by mouth every 6 hours as needed for pain. She will follow-up in 2 weeks prior to cycle #9 with a restaging CT scan of her chest with contrast to reevaluate her disease. She is advised to contact Dr. Golden Pop office regarding her physical therapy. We will monitor her symptoms of diarrhea very closely.  She was advised to call immediately if she has any concerning symptoms in the interval. The patient voices understanding of current  disease status and treatment options and is in agreement with the current care plan.  All questions were answered. The patient knows to call the clinic with any problems, questions or concerns. We can certainly see the patient much sooner if necessary.  Carlton Adam, PA-C 10/13/2014  ADDENDUM: Hematology/Oncology Attending: I had a face to face encounter with the patient today. I recommended her care plan. This is a very pleasant 73 years old white female with metastatic non-small cell lung cancer, squamous cell carcinoma currently undergoing treatment with immunotherapy with Nivolumab status post 7 cycles. She is tolerating her treatment fairly well with no significant adverse effect except for the persistent fatigue and peripheral neuropathy from the previous chemotherapy. Her peripheral neuropathy is better these days with the treatment with Neurontin and oxycodone. I recommended for the patient to proceed with cycle #8 today as scheduled. She would come back for follow-up visit in 2 weeks after repeating CT scan of the chest for reevaluation of her disease. She was advised to call immediately if she has any concerning  symptoms in the interval.  Disclaimer: This note was dictated with voice recognition software. Similar sounding words can inadvertently be transcribed and may not be corrected upon review. Eilleen Kempf., MD 10/13/2014

## 2014-10-13 NOTE — Telephone Encounter (Signed)
lmomtcb x1 for [employer]

## 2014-10-14 ENCOUNTER — Telehealth: Payer: Self-pay | Admitting: Gastroenterology

## 2014-10-14 NOTE — Telephone Encounter (Signed)
Called and spoke to [place]. Informed her of the ok per MR for PT. Sharyn Lull verbalized understanding and denied any further questions or concerns at this time.

## 2014-10-15 NOTE — Telephone Encounter (Signed)
Much calmer today, but still declines. Briefly discussed goals of dilation and also advised her of her dilation to 72mm with goal of 61mm. She says she has "so many other health issues" that she is not in the "right frame of mind" to do this right now. Offer to contact her in 2 to 3 weeks to revisit scheduling. She states "do not". I offered that if she encountered questions or problems, she could call me.  She refuses to schedule any type of appointment or procedure at this time. I will keep her endoscopy report on my desk with the hope I can avoid strife or confusion for her should she call to schedule.

## 2014-10-15 NOTE — Telephone Encounter (Signed)
Please inform the patient that she is absolutely incorrect.  We discussed in detail that dilation therapy is very gradual to minimize the risk for esophageal tear and that it might take more than one session to stretch her stricture.  The purpose of a follow-up dilation is to dilate the stricture sufficiently that it is less apt to occur.  She was dilated to 15 mm and ideally should be dilated to 18 mm

## 2014-10-15 NOTE — Telephone Encounter (Signed)
fyi

## 2014-10-22 ENCOUNTER — Ambulatory Visit (HOSPITAL_COMMUNITY)
Admission: RE | Admit: 2014-10-22 | Discharge: 2014-10-22 | Disposition: A | Discharge status: Home / Self Care | Payer: Medicare Other | Source: Ambulatory Visit | Attending: Physician Assistant | Admitting: Physician Assistant

## 2014-10-22 DIAGNOSIS — C342 Malignant neoplasm of middle lobe, bronchus or lung: Secondary | ICD-10-CM | POA: Insufficient documentation

## 2014-10-22 MED ORDER — IOHEXOL 300 MG/ML  SOLN
80.0000 mL | Freq: Once | INTRAMUSCULAR | Status: AC | PRN
  Administered 2014-10-22: 80 mL via INTRAVENOUS

## 2014-10-23 ENCOUNTER — Ambulatory Visit (HOSPITAL_BASED_OUTPATIENT_CLINIC_OR_DEPARTMENT_OTHER): Payer: Medicare Other | Admitting: Physician Assistant

## 2014-10-23 ENCOUNTER — Encounter: Payer: Self-pay | Admitting: Physician Assistant

## 2014-10-23 ENCOUNTER — Other Ambulatory Visit (HOSPITAL_BASED_OUTPATIENT_CLINIC_OR_DEPARTMENT_OTHER): Payer: Medicare Other

## 2014-10-23 ENCOUNTER — Telehealth: Payer: Self-pay | Admitting: *Deleted

## 2014-10-23 ENCOUNTER — Ambulatory Visit (HOSPITAL_BASED_OUTPATIENT_CLINIC_OR_DEPARTMENT_OTHER): Payer: Medicare Other

## 2014-10-23 ENCOUNTER — Telehealth: Payer: Self-pay | Admitting: Internal Medicine

## 2014-10-23 VITALS — BP 119/69 | HR 105 | Temp 98.3°F | Resp 21 | Ht 66.0 in | Wt 128.5 lb

## 2014-10-23 DIAGNOSIS — R739 Hyperglycemia, unspecified: Secondary | ICD-10-CM

## 2014-10-23 DIAGNOSIS — G629 Polyneuropathy, unspecified: Secondary | ICD-10-CM

## 2014-10-23 DIAGNOSIS — C3491 Malignant neoplasm of unspecified part of right bronchus or lung: Secondary | ICD-10-CM

## 2014-10-23 DIAGNOSIS — C342 Malignant neoplasm of middle lobe, bronchus or lung: Secondary | ICD-10-CM

## 2014-10-23 DIAGNOSIS — I10 Essential (primary) hypertension: Secondary | ICD-10-CM

## 2014-10-23 DIAGNOSIS — C3490 Malignant neoplasm of unspecified part of unspecified bronchus or lung: Secondary | ICD-10-CM

## 2014-10-23 LAB — CBC WITH DIFFERENTIAL/PLATELET
BASO%: 0.2 % (ref 0.0–2.0)
Basophils Absolute: 0 10*3/uL (ref 0.0–0.1)
EOS ABS: 0 10*3/uL (ref 0.0–0.5)
EOS%: 0 % (ref 0.0–7.0)
HEMATOCRIT: 42.8 % (ref 34.8–46.6)
HEMOGLOBIN: 14.4 g/dL (ref 11.6–15.9)
LYMPH%: 15.7 % (ref 14.0–49.7)
MCH: 29.4 pg (ref 25.1–34.0)
MCHC: 33.6 g/dL (ref 31.5–36.0)
MCV: 87.3 fL (ref 79.5–101.0)
MONO#: 0.6 10*3/uL (ref 0.1–0.9)
MONO%: 10.2 % (ref 0.0–14.0)
NEUT%: 73.9 % (ref 38.4–76.8)
NEUTROS ABS: 4.7 10*3/uL (ref 1.5–6.5)
Platelets: 171 10*3/uL (ref 145–400)
RBC: 4.9 10*6/uL (ref 3.70–5.45)
RDW: 15.4 % — ABNORMAL HIGH (ref 11.2–14.5)
WBC: 6.3 10*3/uL (ref 3.9–10.3)
lymph#: 1 10*3/uL (ref 0.9–3.3)

## 2014-10-23 LAB — COMPREHENSIVE METABOLIC PANEL (CC13)
ALT: 20 U/L (ref 0–55)
AST: 14 U/L (ref 5–34)
Albumin: 3.8 g/dL (ref 3.5–5.0)
Alkaline Phosphatase: 107 U/L (ref 40–150)
Anion Gap: 16 mEq/L — ABNORMAL HIGH (ref 3–11)
BUN: 15.2 mg/dL (ref 7.0–26.0)
CO2: 25 mEq/L (ref 22–29)
CREATININE: 1.3 mg/dL — AB (ref 0.6–1.1)
Calcium: 9.9 mg/dL (ref 8.4–10.4)
Chloride: 94 mEq/L — ABNORMAL LOW (ref 98–109)
EGFR: 41 mL/min/{1.73_m2} — ABNORMAL LOW (ref >90)
GLUCOSE: 544 mg/dL — AB (ref 70–140)
Potassium: 4 mEq/L (ref 3.5–5.1)
Sodium: 136 mEq/L (ref 136–145)
Total Bilirubin: 0.56 mg/dL (ref 0.20–1.20)
Total Protein: 6.6 g/dL (ref 6.4–8.3)

## 2014-10-23 LAB — WHOLE BLOOD GLUCOSE
Glucose: 410 mg/dL — ABNORMAL HIGH (ref 70–100)
HRS PC: 0.75 h

## 2014-10-23 LAB — TSH CHCC: TSH: 2.436 m[IU]/L (ref 0.308–3.960)

## 2014-10-23 MED ORDER — SODIUM CHLORIDE 0.9 % IV SOLN
Freq: Once | INTRAVENOUS | Status: AC
  Administered 2014-10-23: 11:00:00 via INTRAVENOUS

## 2014-10-23 MED ORDER — INSULIN REGULAR HUMAN 100 UNIT/ML IJ SOLN
20.0000 [IU] | Freq: Once | INTRAMUSCULAR | Status: AC
  Administered 2014-10-23: 20 [IU] via SUBCUTANEOUS
  Filled 2014-10-23: qty 0.2

## 2014-10-23 NOTE — Patient Instructions (Signed)
Dehydration, Adult Dehydration is when you lose more fluids from the body than you take in. Vital organs like the kidneys, brain, and heart cannot function without a proper amount of fluids and salt. Any loss of fluids from the body can cause dehydration.  CAUSES   Vomiting.  Diarrhea.  Excessive sweating.  Excessive urine output.  Fever. SYMPTOMS  Mild dehydration  Thirst.  Dry lips.  Slightly dry mouth. Moderate dehydration  Very dry mouth.  Sunken eyes.  Skin does not bounce back quickly when lightly pinched and released.  Dark urine and decreased urine production.  Decreased tear production.  Headache. Severe dehydration  Very dry mouth.  Extreme thirst.  Rapid, weak pulse (more than 100 beats per minute at rest).  Cold hands and feet.  Not able to sweat in spite of heat and temperature.  Rapid breathing.  Blue lips.  Confusion and lethargy.  Difficulty being awakened.  Minimal urine production.  No tears. DIAGNOSIS  Your caregiver will diagnose dehydration based on your symptoms and your exam. Blood and urine tests will help confirm the diagnosis. The diagnostic evaluation should also identify the cause of dehydration. TREATMENT  Treatment of mild or moderate dehydration can often be done at home by increasing the amount of fluids that you drink. It is best to drink small amounts of fluid more often. Drinking too much at one time can make vomiting worse. Refer to the home care instructions below. Severe dehydration needs to be treated at the hospital where you will probably be given intravenous (IV) fluids that contain water and electrolytes. HOME CARE INSTRUCTIONS   Ask your caregiver about specific rehydration instructions.  Drink enough fluids to keep your urine clear or pale yellow.  Drink small amounts frequently if you have nausea and vomiting.  Eat as you normally do.  Avoid:  Foods or drinks high in sugar.  Carbonated  drinks.  Juice.  Extremely hot or cold fluids.  Drinks with caffeine.  Fatty, greasy foods.  Alcohol.  Tobacco.  Overeating.  Gelatin desserts.  Wash your hands well to avoid spreading bacteria and viruses.  Only take over-the-counter or prescription medicines for pain, discomfort, or fever as directed by your caregiver.  Ask your caregiver if you should continue all prescribed and over-the-counter medicines.  Keep all follow-up appointments with your caregiver. SEEK MEDICAL CARE IF:  You have abdominal pain and it increases or stays in one area (localizes).  You have a rash, stiff neck, or severe headache.  You are irritable, sleepy, or difficult to awaken.  You are weak, dizzy, or extremely thirsty. SEEK IMMEDIATE MEDICAL CARE IF:   You are unable to keep fluids down or you get worse despite treatment.  You have frequent episodes of vomiting or diarrhea.  You have blood or green matter (bile) in your vomit.  You have blood in your stool or your stool looks black and tarry.  You have not urinated in 6 to 8 hours, or you have only urinated a small amount of very dark urine.  You have a fever.  You faint. MAKE SURE YOU:   Understand these instructions.  Will watch your condition.  Will get help right away if you are not doing well or get worse. Document Released: 10/17/2005 Document Revised: 01/09/2012 Document Reviewed: 06/06/2011 [employer] Patient Information 2015 [employer]. This information is not intended to replace advice given to you by your health care provider. Make sure you discuss any questions you have with your health care   provider.   Hyperglycemia Hyperglycemia occurs when the glucose (sugar) in your blood is too high. Hyperglycemia can happen for many reasons, but it most often happens to people who do not know they have diabetes or are not managing their diabetes properly.  CAUSES  Whether you have diabetes or not, there are other  causes of hyperglycemia. Hyperglycemia can occur when you have diabetes, but it can also occur in other situations that you might not be as aware of, such as: Diabetes  If you have diabetes and are having problems controlling your blood glucose, hyperglycemia could occur because of some of the following reasons:  Not following your meal plan.  Not taking your diabetes medications or not taking it properly.  Exercising less or doing less activity than you normally do.  Being sick. Pre-diabetes  This cannot be ignored. Before people develop Type 2 diabetes, they almost always have "pre-diabetes." This is when your blood glucose levels are higher than normal, but not yet high enough to be diagnosed as diabetes. Research has shown that some long-term damage to the body, especially the heart and circulatory system, may already be occurring during pre-diabetes. If you take action to manage your blood glucose when you have pre-diabetes, you may delay or prevent Type 2 diabetes from developing. Stress  If you have diabetes, you may be "diet" controlled or on oral medications or insulin to control your diabetes. However, you may find that your blood glucose is higher than usual in the hospital whether you have diabetes or not. This is often referred to as "stress hyperglycemia." Stress can elevate your blood glucose. This happens because of hormones put out by the body during times of stress. If stress has been the cause of your high blood glucose, it can be followed regularly by your caregiver. That way he/she can make sure your hyperglycemia does not continue to get worse or progress to diabetes. Steroids  Steroids are medications that act on the infection fighting system (immune system) to block inflammation or infection. One side effect can be a rise in blood glucose. Most people can produce enough extra insulin to allow for this rise, but for those who cannot, steroids make blood glucose levels go  even higher. It is not unusual for steroid treatments to "uncover" diabetes that is developing. It is not always possible to determine if the hyperglycemia will go away after the steroids are stopped. A special blood test called an A1c is sometimes done to determine if your blood glucose was elevated before the steroids were started. SYMPTOMS  Thirsty.  Frequent urination.  Dry mouth.  Blurred vision.  Tired or fatigue.  Weakness.  Sleepy.  Tingling in feet or leg. DIAGNOSIS  Diagnosis is made by monitoring blood glucose in one or all of the following ways:  A1c test. This is a chemical found in your blood.  Fingerstick blood glucose monitoring.  Laboratory results. TREATMENT  First, knowing the cause of the hyperglycemia is important before the hyperglycemia can be treated. Treatment may include, but is not be limited to:  Education.  Change or adjustment in medications.  Change or adjustment in meal plan.  Treatment for an illness, infection, etc.  More frequent blood glucose monitoring.  Change in exercise plan.  Decreasing or stopping steroids.  Lifestyle changes. HOME CARE INSTRUCTIONS   Test your blood glucose as directed.  Exercise regularly. Your caregiver will give you instructions about exercise. Pre-diabetes or diabetes which comes on with stress is helped by  exercising.  Eat wholesome, balanced meals. Eat often and at regular, fixed times. Your caregiver or nutritionist will give you a meal plan to guide your sugar intake.  Being at an ideal weight is important. If needed, losing as little as 10 to 15 pounds may help improve blood glucose levels. SEEK MEDICAL CARE IF:   You have questions about medicine, activity, or diet.  You continue to have symptoms (problems such as increased thirst, urination, or weight gain). SEEK IMMEDIATE MEDICAL CARE IF:   You are vomiting or have diarrhea.  Your breath smells fruity.  You are breathing faster or  slower.  You are very sleepy or incoherent.  You have numbness, tingling, or pain in your feet or hands.  You have chest pain.  Your symptoms get worse even though you have been following your caregiver's orders.  If you have any other questions or concerns. Document Released: 04/12/2001 Document Revised: 01/09/2012 Document Reviewed: 02/13/2012 [facility] Patient Information 2015 [place], [place]. This information is not intended to replace advice given to you by your health care provider. Make sure you discuss any questions you have with your health care provider.

## 2014-10-23 NOTE — Progress Notes (Addendum)
[place] [phone]   [phone]  OFFICE PROGRESS NOTE  [name], MD [address] [place] [address]  DIAGNOSIS: Squamous cell lung cancer  Primary site: Lung (Right)  Staging method: AJCC 7th Edition  Clinical: Stage IIIB (T3, N3, M0)  Summary: Stage IIIB (T3, N3, M0)   PRIOR THERAPY:  1) Concurrent chemoradiation with weekly chemotherapy in the form of carboplatin for AUC of 2 and paclitaxel 45 mg/m2. Status post 7 week of treatment. Last dose was given 01/27/2014 no significant response in her disease.  2) Consolidation chemotherapy with carboplatin for AUC of 5 and paclitaxel 175 mg/M2 every 3 weeks with Neulasta support. First dose on 04/15/2014. Status post 3 cycles. Carboplatin was discontinued starting cycle #2 secondary to hypersensitivity reaction.  CURRENT THERAPY: Immunotherapy with Opdivo (Nivolumab) 3mg /kg given every 2 weeks. Status post 8 cycles.  CHEMOTHERAPY INTENT: Control/Palliative  CURRENT # OF CHEMOTHERAPY CYCLES: 8 CURRENT ANTIEMETICS: Zofran, dexamethasone, Compazine  CURRENT SMOKING STATUS: Former smoker, quit 12/05/1997  ORAL CHEMOTHERAPY AND CONSENT: n/a  CURRENT BISPHOSPHONATES USE: None  PAIN MANAGEMENT: no cancer related pain, patient does have RA  NARCOTICS INDUCED CONSTIPATION: none  LIVING WILL AND CODE STATUS: ?   INTERVAL HISTORY: Megan [name] 73 y.o. female returns to the clinic today for followup visit accompanied by her significant other. Megan [name] is status post 8 cycles of immunotherapy with Nivolumab. Megan [name] recently had a restaging CT scan of her chest and presents to discuss the results. Megan [name] continues on gabapentin 600 mg by mouth twice daily with some improvement in her peripheral neuropathy. Megan [name] is followed by Dr. Wilson Singer for her diabetes and continues on metformin. Megan [name] has some occasional diarrhea related to the metformin. Megan [name] continues to tolerate the immunotherapy  without difficulty. Megan [name] has had no changes to her baseline shortness of breath, denies any skin rashes or significant diarrhea. Megan [name] has gained some weight and reports that Megan [name] is eating well. Megan [name] continues to have some arthralgias and myalgias and muscle weakness. This may be related to her rheumatoid arthritis. The patient denied having any significant chest pain but has shortness breath with exertion with no cough or hemoptysis. Megan [name] denied having any significant weight loss or night sweats. Megan [name] has no nausea or vomiting.   MEDICAL HISTORY: Past Medical History  Diagnosis Date  . Hypertension   . Diabetes mellitus   . Rheumatoid arthritis   . GERD (gastroesophageal reflux disease)     no meds for  . Hx of radiation therapy 12/16/13-01/30/14    lung 66Gy  . Cancer     LUNG  . Neuropathy   . Shortness of breath dyspnea     with exertion  . History of hiatal hernia     ALLERGIES:  is allergic to remicade; carboplatin; and hydromorphone.  MEDICATIONS:  Current Outpatient Prescriptions  Medication Sig Dispense Refill  . docusate sodium (COLACE) 100 MG capsule Take 1 capsule (100 mg total) by mouth every 12 (twelve) hours. 60 capsule 0  . dronabinol (MARINOL) 10 MG capsule Take 1 capsule (10 mg total) by mouth 2 (two) times daily before a meal. 60 capsule 4  . Fluticasone-Salmeterol (ADVAIR DISKUS) 100-50 MCG/DOSE AEPB Inhale 1 puff into the lungs 2 (two) times daily. 60 each 5  . Gabapentin, Once-Daily, (GRALISE) 600 MG TABS Take 1,800 mg by mouth daily. 90 tablet 6  . ibuprofen (ADVIL,MOTRIN) 600 MG tablet Take 1 tablet (600 mg total) by mouth every  6 (six) hours as needed. Take with food. 30 tablet 0  . lidocaine (LIDODERM) 5 % PLACE 3 PATCHES ONTO THE SKIN EVERY DAY. REMOVE AND DISCARD PATCH 12 HOURS LATER. ON FOR 12 HOURS, OFF FOR 12 HOURS. MAY CUT PATCH TO FIT 90 patch 1  . losartan-hydrochlorothiazide (HYZAAR) 100-25 MG per tablet Take 1 tablet by mouth daily.    . meloxicam (MOBIC) 15  MG tablet Take 15 mg by mouth daily.    . metFORMIN (GLUCOPHAGE) 500 MG tablet Take 1 tablet (500 mg total) by mouth 2 (two) times daily with a meal. 60 tablet 1  . potassium chloride SA (K-DUR,KLOR-CON) 20 MEQ tablet Take 40 mEq by mouth daily.    . prochlorperazine (COMPAZINE) 10 MG tablet Take 10 mg by mouth every 6 (six) hours as needed for nausea or vomiting.    . vitamin B-12 (CYANOCOBALAMIN) 100 MCG tablet Take 100 mcg by mouth daily.    [name] zolpidem (AMBIEN) 10 MG tablet Take 10 mg by mouth at bedtime as needed for sleep.    . Biotin 5000 MCG CAPS Take 1 capsule by mouth daily.     . Gabapentin, PHN, (GRALISE STARTER) 300 & 600 MG MISC Take 1,800 mg by mouth at bedtime. (Patient not taking: Reported on 10/23/2014) 1 each 1  . lansoprazole (PREVACID SOLUTAB) 30 MG disintegrating tablet Take 1 tablet (30 mg total) by mouth daily. (Patient not taking: Reported on 10/23/2014) 90 tablet 3   Current Facility-Administered Medications  Medication Dose Route Frequency Provider Last Rate Last Dose  . insulin regular (NOVOLIN R,HUMULIN R) 100 units/mL injection 20 Units  20 Units Subcutaneous Once Yumna Ebers E Nikoleta Dady, PA-C       Facility-Administered Medications Ordered in Other Visits  Medication Dose Route Frequency Provider Last Rate Last Dose  . sodium chloride 0.9 % injection 10 mL  10 mL Intracatheter PRN Curt Bears, MD   10 mL at 12/30/13 1121    SURGICAL HISTORY:  Past Surgical History  Procedure Laterality Date  . Cholecystectomy    . Abdominal hysterectomy    . Dilation and curettage of uterus    . Abdominal surgery    . Tonsillectomy    . Esophagogastroduodenoscopy (egd) with propofol N/A 10/09/2014    Procedure: ESOPHAGOGASTRODUODENOSCOPY (EGD) WITH PROPOFOL;  Surgeon: Inda Castle, MD;  Location: [facility];  Service: Endoscopy;  Laterality: N/A;  . Savory dilation N/A 10/09/2014    Procedure: SAVORY DILATION;  Surgeon: Inda Castle, MD;  Location: [facility];   Service: Endoscopy;  Laterality: N/A;    REVIEW OF SYSTEMS:  Constitutional: negative Eyes: negative Ears, nose, mouth, throat, and face: negative Respiratory: positive for dyspnea on exertion and At her baseline Cardiovascular: negative Gastrointestinal: positive for diarrhea, dyspepsia and Diarrhea more likely related to her metformin Genitourinary:negative Integument/breast: negative Hematologic/lymphatic: negative Musculoskeletal:positive for arthralgias, muscle weakness, myalgias and Present prior to her treatment with Nivolumab Neurological: negative Behavioral/Psych: negative Endocrine: negative Allergic/Immunologic: negative   PHYSICAL EXAMINATION: General appearance: alert, cooperative and no distress Head: Normocephalic, without obvious abnormality, atraumatic Neck: no adenopathy, no JVD, supple, symmetrical, trachea midline and thyroid not enlarged, symmetric, no tenderness/mass/nodules Lymph nodes: Cervical, supraclavicular, and axillary nodes normal. Resp: clear to auscultation bilaterally Back: symmetric, no curvature. ROM normal. No CVA tenderness. Cardio: regular rate and rhythm, S1, S2 normal, no murmur, click, rub or gallop GI: soft, non-tender; bowel sounds normal; no masses,  no organomegaly Extremities: extremities normal, atraumatic, no cyanosis or edema Neurologic: Alert and oriented  X 3, normal strength and tone. Normal symmetric reflexes. Normal coordination and gait  ECOG PERFORMANCE STATUS: 1 - Symptomatic but completely ambulatory  Blood pressure 119/69, pulse 105, temperature 98.3 F (36.8 C), temperature source Oral, resp. rate 21, height 5\' 6"  (1.676 m), weight 128 lb 8 oz (58.287 kg), SpO2 96 %.  LABORATORY DATA: Lab Results  Component Value Date   WBC 6.3 10/23/2014   HGB 14.4 10/23/2014   HCT 42.8 10/23/2014   MCV 87.3 10/23/2014   PLT 171 10/23/2014      Chemistry      Component Value Date/Time   NA 136 10/23/2014 0826   NA 138  10/09/2014 1328   K 4.0 10/23/2014 0826   K 3.7 10/09/2014 1328   CL 92* 09/08/2014 1417   CO2 25 10/23/2014 0826   CO2 28 09/08/2014 1417   BUN 15.2 10/23/2014 0826   BUN 15 09/08/2014 1417   CREATININE 1.3* 10/23/2014 0826   CREATININE 0.67 09/08/2014 1417   GLU 481* 09/08/2014 1319      Component Value Date/Time   CALCIUM 9.9 10/23/2014 0826   CALCIUM 9.6 09/08/2014 1417   ALKPHOS 107 10/23/2014 0826   ALKPHOS 118* 09/08/2014 1417   AST 14 10/23/2014 0826   AST 17 09/08/2014 1417   ALT 20 10/23/2014 0826   ALT 25 09/08/2014 1417   BILITOT 0.56 10/23/2014 0826   BILITOT 0.4 09/08/2014 1417       RADIOGRAPHIC STUDIES: Ct Chest W Contrast  10/22/2014   CLINICAL DATA:  Restaging non-small cell lung cancer  EXAM: CT CHEST WITH CONTRAST  TECHNIQUE: Multidetector CT imaging of the chest was performed during intravenous contrast administration.  CONTRAST:  49mL OMNIPAQUE IOHEXOL 300 MG/ML  SOLN  COMPARISON:  CT 08/19/2014  FINDINGS: No axillary supraclavicular lymphadenopathy. Seven mm nodule in the left lobe of thyroid gland is not changed. No mediastinal lymphadenopathy. There is mucosal thickening with a wound esophagus inferior to the carina. No central pulmonary embolism.  There is collapse of the right upper lobe unchanged from prior. Within the central portion of this collapse lung, there is a 15 by 11 mm low-density region which compares to 30 x 18 mm on prior. There is a consolidative pattern in the right lower lobe which is angular image 19, series 5 which likely represents postradiation change.  Small 4 mm nodule in the left upper lobe (image 19, series 5) not changed. Calcified nodule of the lung base is unchanged.  Limited view of the upper abdomen demonstrates normal adrenal glands. There is a low-density lesion the liver. Postcholecystectomy.  IMPRESSION: 1. Stable atelectasis in the right upper lobe. 2. Contraction of the central low attenuation region within this collapsed  lung. 3. Stable postradiation consolidative pattern in the superior segment of the right lower lobe. 4. Stable small left upper lobe pulmonary nodule.   Electronically Signed   By: Suzy Bouchard M.D.   On: 10/22/2014 10:11   Dg Esophagus Dilatation  10/09/2014   CLINICAL DATA:    ESOPHAGEAL DILATATION  Fluoroscopy was provided for use by the requesting physician.  No images  were obtained for radiographic interpretation.     ASSESSMENT AND PLAN: This is a very pleasant 73 years old white female with history of stage IIIB non-small cell lung cancer status post a course of concurrent chemoradiation with weekly carboplatin and paclitaxel status post 7 cycles. The patient tolerated her treatment fairly well with no significant adverse effects except for the radiation induced esophagitis.  Megan [name] was also treated with 3 cycles of consolidation chemotherapy with carboplatin and paclitaxel but carboplatin was discontinued the start of cycle #2 secondary to hypersensitivity reaction. Her restaging scan showed evidence for disease progression and the patient is currently undergoing immunotherapy with Nivolumab 3 mg/KG every 2 weeks is status post 8 cycles. The patient was discussed with and also seen by Dr. Julien Nordmann.  Her recent restaging CT scan of the chest with contrast revealed stable disease Megan [name] will proceed with cycle #9 on 10/27/2014 as scheduled. For the peripheral neuropathy, Megan [name] will continue treatment with Neurontin and Percocet.  For the peripheral neuropathy Megan [name] will continue on ibuprofen to 600 mg by mouth every 6 hours as needed for pain. Megan [name] will follow-up in 2 weeks prior to cycle #10.   Of note patient's chemistries came back and her blood glucose was 544. We brought her to our infusion area and gave her 20 units of regular insulin, had 250 ML's of normal saline running over an hour while we awaited a follow-up blood glucose reading which was 410. Megan [name] was advised to closely monitor her blood  sugars at home and to follow-up with Dr. Wilson Singer regarding her blood sugar control medications.   Megan [name] was advised to call immediately if Megan [name] has any concerning symptoms in the interval. The patient voices understanding of current disease status and treatment options and is in agreement with the current care plan.  All questions were answered. The patient knows to call the clinic with any problems, questions or concerns. We can certainly see the patient much sooner if necessary.  Carlton Adam, PA-C 10/23/2014  ADDENDUM: Hematology/Oncology Attending: I had a face to face encounter with the patient. I recommended her care plan. This is a very pleasant 73 years old white female with progressive non-small cell lung cancer, squamous cell carcinoma is currently undergoing immunotherapy with single agent Nivolumab status post 8 cycles. Megan [name] is tolerating her treatment with Nivolumab fairly well with no significant adverse effects. Megan [name] denied having any significant skin rash, diarrhea, nausea or vomiting. Repeat CT scan of the chest after cycle #8 showed no evidence for disease progression. I discussed the scan results with the patient and her friend today. I recommended for the patient to continue her current treatment with Nivolumab. Megan [name] will receive cycle #9 today. For the peripheral neuropathy Megan [name] will continue on Neurontin and Percocet. For the history of rheumatoid arthritis, the patient will continue on ibuprofen. For hyperglycemia, the patient was given 20 units of regular insulin today and Megan [name] was advised to see her primary care physician for evaluation and management of this problem. Megan [name] will come back for follow-up visit in 2 weeks with the start of cycle #10. The patient was advised to call immediately if Megan [name] has any concerning symptoms in the interval.  Disclaimer: This note was dictated with voice recognition software. Similar sounding words can inadvertently be transcribed and may be  missed upon review. Eilleen Kempf., MD 10/25/2014

## 2014-10-23 NOTE — Telephone Encounter (Signed)
, °

## 2014-10-23 NOTE — Telephone Encounter (Signed)
Per staff message and POF I have scheduled appts. Advised scheduler of appts. JMW Per staff message and POF I have scheduled appts. Advised scheduler of appts. JMW

## 2014-10-23 NOTE — Progress Notes (Signed)
Repeat Blood Glucose is 410. Made Megan Metro, PA aware.  OK to discharge patient to home after 500 cc .9NS completed, and for patient to follow up with her Endocrinologist, Dr. Wilson Singer. Discussed this with pt and she verbalizes understanding. Re-educated on dietary restrictions due to diabetes and high glucose levels. Pt. Verbalizes understanding but is not always compliant.Caregiver/ friend [name] also acknowledged understanding of instructions.

## 2014-10-23 NOTE — Patient Instructions (Signed)
Your blood sugar was very high today at 544. You were given 20 units of regular insulin to help lower it. Your repeat blood sugar after one hour was 410. You need to closely monitor your home blood sugar readings and get in touch with Dr. Wilson Singer for another appointment to discuss improved blood sugar control. You may need further medications to help improve your blood sugar control. Your restaging CT scan revealed stable disease. Follow-up as scheduled on 09/27/2014 for your next scheduled cycle of immunotherapy Follow-up 2 weeks after 09/27/2014 for another symptom management visit prior to your next scheduled cycle of immunotherapy

## 2014-10-27 ENCOUNTER — Telehealth: Payer: Self-pay | Admitting: Internal Medicine

## 2014-10-27 ENCOUNTER — Ambulatory Visit (HOSPITAL_BASED_OUTPATIENT_CLINIC_OR_DEPARTMENT_OTHER): Payer: Medicare Other

## 2014-10-27 ENCOUNTER — Telehealth: Payer: Self-pay | Admitting: Adult Health

## 2014-10-27 DIAGNOSIS — R1310 Dysphagia, unspecified: Secondary | ICD-10-CM

## 2014-10-27 DIAGNOSIS — Z5112 Encounter for antineoplastic immunotherapy: Secondary | ICD-10-CM

## 2014-10-27 DIAGNOSIS — C342 Malignant neoplasm of middle lobe, bronchus or lung: Secondary | ICD-10-CM

## 2014-10-27 MED ORDER — SODIUM CHLORIDE 0.9 % IV SOLN
Freq: Once | INTRAVENOUS | Status: AC
  Administered 2014-10-27: 12:00:00 via INTRAVENOUS

## 2014-10-27 MED ORDER — SODIUM CHLORIDE 0.9 % IV SOLN
175.0000 mg | Freq: Once | INTRAVENOUS | Status: AC
  Administered 2014-10-27: 175 mg via INTRAVENOUS
  Filled 2014-10-27: qty 17.5

## 2014-10-27 NOTE — Telephone Encounter (Signed)
TP gave RX. Closed out this note/spm

## 2014-10-27 NOTE — Telephone Encounter (Signed)
lmtcb X1 for pt  

## 2014-10-27 NOTE — Patient Instructions (Signed)
[facility] Discharge Instructions for Patients Receiving Chemotherapy  Today you received the following chemotherapy agents:  Nivolumab  To help prevent nausea and vomiting after your treatment, we encourage you to take your nausea medication as ordered per MD.   If you develop nausea and vomiting that is not controlled by your nausea medication, call the clinic.   BELOW ARE SYMPTOMS THAT SHOULD BE REPORTED IMMEDIATELY:  *FEVER GREATER THAN 100.5 F  *CHILLS WITH OR WITHOUT FEVER  NAUSEA AND VOMITING THAT IS NOT CONTROLLED WITH YOUR NAUSEA MEDICATION  *UNUSUAL SHORTNESS OF BREATH  *UNUSUAL BRUISING OR BLEEDING  TENDERNESS IN MOUTH AND THROAT WITH OR WITHOUT PRESENCE OF ULCERS  *URINARY PROBLEMS  *BOWEL PROBLEMS  UNUSUAL RASH Items with * indicate a potential emergency and should be followed up as soon as possible.  Feel free to call the clinic you have any questions or concerns. The clinic phone number is [phone].

## 2014-10-28 MED ORDER — PANTOPRAZOLE SODIUM 40 MG PO TBEC: 40.0000 mg | DELAYED_RELEASE_TABLET | Freq: Every day | ORAL | Status: DC

## 2014-10-28 NOTE — Telephone Encounter (Signed)
Needing refill of Pantoprazole 40mg  Upcoming appt 11/25/14 Refill sent to pharmacy. Nothing further needed.

## 2014-11-06 ENCOUNTER — Other Ambulatory Visit: Payer: Medicare Other

## 2014-11-06 ENCOUNTER — Ambulatory Visit: Payer: Medicare Other | Admitting: Internal Medicine

## 2014-11-10 ENCOUNTER — Other Ambulatory Visit (HOSPITAL_BASED_OUTPATIENT_CLINIC_OR_DEPARTMENT_OTHER): Payer: Medicare Other

## 2014-11-10 ENCOUNTER — Ambulatory Visit (HOSPITAL_BASED_OUTPATIENT_CLINIC_OR_DEPARTMENT_OTHER): Payer: Medicare Other | Admitting: Internal Medicine

## 2014-11-10 ENCOUNTER — Ambulatory Visit (HOSPITAL_BASED_OUTPATIENT_CLINIC_OR_DEPARTMENT_OTHER): Payer: Medicare Other

## 2014-11-10 ENCOUNTER — Encounter: Payer: Self-pay | Admitting: Internal Medicine

## 2014-11-10 ENCOUNTER — Telehealth: Payer: Self-pay | Admitting: Internal Medicine

## 2014-11-10 VITALS — BP 105/67 | HR 106 | Temp 97.6°F | Resp 17 | Ht 66.0 in | Wt 127.0 lb

## 2014-11-10 DIAGNOSIS — C770 Secondary and unspecified malignant neoplasm of lymph nodes of head, face and neck: Secondary | ICD-10-CM

## 2014-11-10 DIAGNOSIS — C342 Malignant neoplasm of middle lobe, bronchus or lung: Secondary | ICD-10-CM

## 2014-11-10 DIAGNOSIS — G629 Polyneuropathy, unspecified: Secondary | ICD-10-CM

## 2014-11-10 DIAGNOSIS — G620 Drug-induced polyneuropathy: Secondary | ICD-10-CM

## 2014-11-10 DIAGNOSIS — C3490 Malignant neoplasm of unspecified part of unspecified bronchus or lung: Secondary | ICD-10-CM

## 2014-11-10 DIAGNOSIS — T451X5A Adverse effect of antineoplastic and immunosuppressive drugs, initial encounter: Secondary | ICD-10-CM

## 2014-11-10 DIAGNOSIS — C3491 Malignant neoplasm of unspecified part of right bronchus or lung: Secondary | ICD-10-CM

## 2014-11-10 DIAGNOSIS — E1165 Type 2 diabetes mellitus with hyperglycemia: Secondary | ICD-10-CM

## 2014-11-10 DIAGNOSIS — Z5112 Encounter for antineoplastic immunotherapy: Secondary | ICD-10-CM

## 2014-11-10 LAB — CBC WITH DIFFERENTIAL/PLATELET
BASO%: 0.2 % (ref 0.0–2.0)
BASOS ABS: 0 10*3/uL (ref 0.0–0.1)
EOS ABS: 0 10*3/uL (ref 0.0–0.5)
EOS%: 0 % (ref 0.0–7.0)
HCT: 43.3 % (ref 34.8–46.6)
HEMOGLOBIN: 14 g/dL (ref 11.6–15.9)
LYMPH%: 18.1 % (ref 14.0–49.7)
MCH: 29.1 pg (ref 25.1–34.0)
MCHC: 32.4 g/dL (ref 31.5–36.0)
MCV: 89.8 fL (ref 79.5–101.0)
MONO#: 0.7 10*3/uL (ref 0.1–0.9)
MONO%: 15 % — ABNORMAL HIGH (ref 0.0–14.0)
NEUT%: 66.7 % (ref 38.4–76.8)
NEUTROS ABS: 3.1 10*3/uL (ref 1.5–6.5)
PLATELETS: 174 10*3/uL (ref 145–400)
RBC: 4.82 10*6/uL (ref 3.70–5.45)
RDW: 16 % — ABNORMAL HIGH (ref 11.2–14.5)
WBC: 4.6 10*3/uL (ref 3.9–10.3)
lymph#: 0.8 10*3/uL — ABNORMAL LOW (ref 0.9–3.3)

## 2014-11-10 LAB — COMPREHENSIVE METABOLIC PANEL (CC13)
ALT: 24 U/L (ref 0–55)
AST: 16 U/L (ref 5–34)
Albumin: 3.4 g/dL — ABNORMAL LOW (ref 3.5–5.0)
Alkaline Phosphatase: 95 U/L (ref 40–150)
Anion Gap: 13 mEq/L — ABNORMAL HIGH (ref 3–11)
BILIRUBIN TOTAL: 0.77 mg/dL (ref 0.20–1.20)
BUN: 17.8 mg/dL (ref 7.0–26.0)
CHLORIDE: 99 meq/L (ref 98–109)
CO2: 25 mEq/L (ref 22–29)
CREATININE: 1.1 mg/dL (ref 0.6–1.1)
Calcium: 10 mg/dL (ref 8.4–10.4)
EGFR: 50 mL/min/{1.73_m2} — ABNORMAL LOW (ref >90)
Glucose: 407 mg/dl — ABNORMAL HIGH (ref 70–140)
Potassium: 3.8 mEq/L (ref 3.5–5.1)
Sodium: 136 mEq/L (ref 136–145)
TOTAL PROTEIN: 6.6 g/dL (ref 6.4–8.3)

## 2014-11-10 LAB — WHOLE BLOOD GLUCOSE
Glucose: 400 mg/dL — ABNORMAL HIGH (ref 70–100)
HRS PC: 1 h

## 2014-11-10 LAB — TECHNOLOGIST REVIEW

## 2014-11-10 LAB — TSH CHCC: TSH: 6.336 m(IU)/L — ABNORMAL HIGH (ref 0.308–3.960)

## 2014-11-10 MED ORDER — SODIUM CHLORIDE 0.9 % IV SOLN
Freq: Once | INTRAVENOUS | Status: AC
  Administered 2014-11-10: 10:00:00 via INTRAVENOUS

## 2014-11-10 MED ORDER — INSULIN REGULAR HUMAN 100 UNIT/ML IJ SOLN
20.0000 [IU] | Freq: Once | INTRAMUSCULAR | Status: AC
  Administered 2014-11-10: 20 [IU] via SUBCUTANEOUS
  Filled 2014-11-10: qty 0.2

## 2014-11-10 MED ORDER — SODIUM CHLORIDE 0.9 % IV SOLN
3.0000 mg/kg | Freq: Once | INTRAVENOUS | Status: AC
  Administered 2014-11-10: 160 mg via INTRAVENOUS
  Filled 2014-11-10: qty 16

## 2014-11-10 NOTE — Progress Notes (Signed)
[facility] Telephone:([phone]) [phone]   Fax:([phone]) [phone]  OFFICE PROGRESS NOTE  [name], MD [address] [place] [address]  DIAGNOSIS: Squamous cell lung cancer  Primary site: Lung (Right)  Staging method: AJCC 7th Edition  Clinical: Stage IIIB (T3, N3, M0)  Summary: Stage IIIB (T3, N3, M0)   PRIOR THERAPY:  1) Concurrent chemoradiation with weekly chemotherapy in the form of carboplatin for AUC of 2 and paclitaxel 45 mg/m2. Status post 7 week of treatment. Last dose was given 01/27/2014 no significant response in her disease.  2) Consolidation chemotherapy with carboplatin for AUC of 5 and paclitaxel 175 mg/M2 every 3 weeks with Neulasta support. First dose on 04/15/2014. Status post 3 cycles. Carboplatin was discontinued starting cycle #2 secondary to hypersensitivity reaction.  CURRENT THERAPY: Immunotherapy with Opdivo (Nivolumab) 3mg /kg given every 2 weeks. Status post 9 cycles.  CHEMOTHERAPY INTENT: Control/Palliative  CURRENT # OF CHEMOTHERAPY CYCLES: 10 CURRENT ANTIEMETICS: Zofran, dexamethasone, Compazine  CURRENT SMOKING STATUS: Former smoker, quit 12/05/1997  ORAL CHEMOTHERAPY AND CONSENT: n/a   CURRENT BISPHOSPHONATES USE: None  PAIN MANAGEMENT: no cancer related pain, patient does have RA  NARCOTICS INDUCED CONSTIPATION: none  LIVING WILL AND CODE STATUS: ?   INTERVAL HISTORY: Megan [name] 74 y.o. female returns to the clinic today for followup visit accompanied by a friend. The patient is doing fine today was no specific complaints except for the persistent peripheral neuropathy. She is currently on gabapentin 600 mg by mouth twice a day. She also has uncontrolled blood sugar recently. She was seen by her primary care physician and started on insulin sliding scale in addition to metformin. She is also on treatment with Ceftin 500 mg by mouth twice a day for chest congestion and questionable bronchitis. The  patient denied having any significant chest pain but has shortness of breath with exertion with no cough or hemoptysis. She denied having any significant weight loss or night sweats. She has no nausea or vomiting. She is today to start cycle #10 of her treatment with Nivolumab.  MEDICAL HISTORY: Past Medical History  Diagnosis Date  . Hypertension   . Diabetes mellitus   . Rheumatoid arthritis   . GERD (gastroesophageal reflux disease)     no meds for  . Hx of radiation therapy 12/16/13-01/30/14    lung 66Gy  . Cancer     LUNG  . Neuropathy   . Shortness of breath dyspnea     with exertion  . History of hiatal hernia     ALLERGIES:  is allergic to remicade; carboplatin; and hydromorphone.  MEDICATIONS:  Current Outpatient Prescriptions  Medication Sig Dispense Refill  . B-D ULTRAFINE III SHORT PEN 31G X 8 MM MISC   0  . Biotin 5000 MCG CAPS Take 1 capsule by mouth daily.     [name] docusate sodium (COLACE) 100 MG capsule Take 1 capsule (100 mg total) by mouth every 12 (twelve) hours. 60 capsule 0  . dronabinol (MARINOL) 10 MG capsule Take 1 capsule (10 mg total) by mouth 2 (two) times daily before a meal. 60 capsule 4  . Fluticasone-Salmeterol (ADVAIR DISKUS) 100-50 MCG/DOSE AEPB Inhale 1 puff into the lungs 2 (two) times daily. 60 each 5  . Gabapentin, Once-Daily, (GRALISE) 600 MG TABS Take 1,800 mg by mouth daily. 90 tablet 6  . Gabapentin, PHN, (GRALISE STARTER) 300 & 600 MG MISC Take 1,800 mg by mouth at bedtime. (Patient not taking: Reported on  10/23/2014) 1 each 1  . HUMALOG KWIKPEN 100 UNIT/ML KiwkPen   0  . ibuprofen (ADVIL,MOTRIN) 600 MG tablet Take 1 tablet (600 mg total) by mouth every 6 (six) hours as needed. Take with food. 30 tablet 0  . lansoprazole (PREVACID SOLUTAB) 30 MG disintegrating tablet Take 1 tablet (30 mg total) by mouth daily. (Patient not taking: Reported on 10/23/2014) 90 tablet 3  . LEVEMIR FLEXTOUCH 100 UNIT/ML Pen   0  . lidocaine (LIDODERM) 5 % PLACE 3  PATCHES ONTO THE SKIN EVERY DAY. REMOVE AND DISCARD PATCH 12 HOURS LATER. ON FOR 12 HOURS, OFF FOR 12 HOURS. MAY CUT PATCH TO FIT 90 patch 1  . losartan-hydrochlorothiazide (HYZAAR) 100-25 MG per tablet Take 1 tablet by mouth daily.    . meloxicam (MOBIC) 15 MG tablet Take 15 mg by mouth daily.    . metFORMIN (GLUCOPHAGE) 500 MG tablet Take 1 tablet (500 mg total) by mouth 2 (two) times daily with a meal. 60 tablet 1  . pantoprazole (PROTONIX) 40 MG tablet Take 1 tablet (40 mg total) by mouth daily. 90 tablet 0  . potassium chloride SA (K-DUR,KLOR-CON) 20 MEQ tablet Take 40 mEq by mouth daily.    . prochlorperazine (COMPAZINE) 10 MG tablet Take 10 mg by mouth every 6 (six) hours as needed for nausea or vomiting.    . vitamin B-12 (CYANOCOBALAMIN) 100 MCG tablet Take 100 mcg by mouth daily.    [name] zolpidem (AMBIEN) 10 MG tablet Take 10 mg by mouth at bedtime as needed for sleep.     No current facility-administered medications for this visit.   Facility-Administered Medications Ordered in Other Visits  Medication Dose Route Frequency Provider Last Rate Last Dose  . sodium chloride 0.9 % injection 10 mL  10 mL Intracatheter PRN Curt Bears, MD   10 mL at 12/30/13 1121    SURGICAL HISTORY:  Past Surgical History  Procedure Laterality Date  . Cholecystectomy    . Abdominal hysterectomy    . Dilation and curettage of uterus    . Abdominal surgery    . Tonsillectomy    . Esophagogastroduodenoscopy (egd) with propofol N/A 10/09/2014    Procedure: ESOPHAGOGASTRODUODENOSCOPY (EGD) WITH PROPOFOL;  Surgeon: Inda Castle, MD;  Location: [facility];  Service: Endoscopy;  Laterality: N/A;  . Savory dilation N/A 10/09/2014    Procedure: SAVORY DILATION;  Surgeon: Inda Castle, MD;  Location: [facility];  Service: Endoscopy;  Laterality: N/A;    REVIEW OF SYSTEMS:  Constitutional: negative Eyes: negative Ears, nose, mouth, throat, and face: negative Respiratory:  negative Cardiovascular: negative Gastrointestinal: positive for dyspepsia Genitourinary:negative Integument/breast: negative Hematologic/lymphatic: negative Musculoskeletal:negative Neurological: negative Behavioral/Psych: negative Endocrine: negative Allergic/Immunologic: negative   PHYSICAL EXAMINATION: General appearance: alert, cooperative and no distress Head: Normocephalic, without obvious abnormality, atraumatic Neck: no adenopathy, no JVD, supple, symmetrical, trachea midline and thyroid not enlarged, symmetric, no tenderness/mass/nodules Lymph nodes: Cervical, supraclavicular, and axillary nodes normal. Resp: clear to auscultation bilaterally Back: symmetric, no curvature. ROM normal. No CVA tenderness. Cardio: regular rate and rhythm, S1, S2 normal, no murmur, click, rub or gallop GI: soft, non-tender; bowel sounds normal; no masses,  no organomegaly Extremities: extremities normal, atraumatic, no cyanosis or edema Neurologic: Alert and oriented X 3, normal strength and tone. Normal symmetric reflexes. Normal coordination and gait  ECOG PERFORMANCE STATUS: 1 - Symptomatic but completely ambulatory  Blood pressure 105/67, pulse 106, temperature 97.6 F (36.4 C), temperature source Oral, resp. rate 17, height 5\' 6"  (1.676  m), weight 127 lb (57.607 kg), SpO2 98 %.  LABORATORY DATA: Lab Results  Component Value Date   WBC 4.6 11/10/2014   HGB 14.0 11/10/2014   HCT 43.3 11/10/2014   MCV 89.8 11/10/2014   PLT 174 11/10/2014      Chemistry      Component Value Date/Time   NA 136 11/10/2014 0822   NA 138 10/09/2014 1328   K 3.8 11/10/2014 0822   K 3.7 10/09/2014 1328   CL 92* 09/08/2014 1417   CO2 25 11/10/2014 0822   CO2 28 09/08/2014 1417   BUN 17.8 11/10/2014 0822   BUN 15 09/08/2014 1417   CREATININE 1.1 11/10/2014 0822   CREATININE 0.67 09/08/2014 1417   GLU 410* 10/23/2014 1210      Component Value Date/Time   CALCIUM 10.0 11/10/2014 0822   CALCIUM 9.6  09/08/2014 1417   ALKPHOS 95 11/10/2014 0822   ALKPHOS 118* 09/08/2014 1417   AST 16 11/10/2014 0822   AST 17 09/08/2014 1417   ALT 24 11/10/2014 0822   ALT 25 09/08/2014 1417   BILITOT 0.77 11/10/2014 0822   BILITOT 0.4 09/08/2014 1417       RADIOGRAPHIC STUDIES:  ASSESSMENT AND PLAN: This is a very pleasant 74 years old white female with history of stage IIIB non-small cell lung cancer status post a course of concurrent chemoradiation with weekly carboplatin and paclitaxel status post 7 cycles. The patient tolerated her treatment fairly well with no significant adverse effects except for the radiation induced esophagitis.  She was also treated with 3 cycles of consolidation chemotherapy with carboplatin and paclitaxel but carboplatin was discontinued the start of cycle #2 secondary to hypersensitivity reaction. Her restaging scan showed evidence for disease progression and the patient is currently undergoing immunotherapy with Nivolumab 3 mg/KG every 2 weeks is status post 9 cycles. I recommended for her to proceed with cycle #10 today as scheduled. For the peripheral neuropathy, she will continue treatment with Neurontin and Percocet.  For the peripheral neuropathy she will continue on ibuprofen to 600 mg by mouth every 6 hours as needed for pain. For the hyperglycemia, I will give the patient regular insulin 20 unit today. She was advised to keep her appointment and follow-up with her primary care physician for more adjustment of her antidiabetic medications. She would come back for followup visit in 2 weeks for reevaluation before starting the next dose of her chemotherapy She was advised to call immediately if she has any concerning symptoms in the interval. The patient voices understanding of current disease status and treatment options and is in agreement with the current care plan.  All questions were answered. The patient knows to call the clinic with any problems, questions or  concerns. We can certainly see the patient much sooner if necessary.  Disclaimer: This note was dictated with voice recognition software. Similar sounding words can inadvertently be transcribed and may not be corrected upon review.

## 2014-11-10 NOTE — Progress Notes (Signed)
S/w dr Julien Nordmann about cbg 400. Instructed pt to contact her pcp. She strongly agreed she would.

## 2014-11-10 NOTE — Telephone Encounter (Signed)
gv adn printeda ppt sched and avs for pt for Jan adn Feb 2016....sed added tx.

## 2014-11-10 NOTE — Patient Instructions (Addendum)
[facility] Discharge Instructions for Patients Receiving Chemotherapy  Today you received the following chemotherapy agents nivolumab  To help prevent nausea and vomiting after your treatment, we encourage you to take your nausea medication if needed   If you develop nausea and vomiting that is not controlled by your nausea medication, call the clinic.   BELOW ARE SYMPTOMS THAT SHOULD BE REPORTED IMMEDIATELY:  *FEVER GREATER THAN 100.5 F  *CHILLS WITH OR WITHOUT FEVER  NAUSEA AND VOMITING THAT IS NOT CONTROLLED WITH YOUR NAUSEA MEDICATION  *UNUSUAL SHORTNESS OF BREATH  *UNUSUAL BRUISING OR BLEEDING  TENDERNESS IN MOUTH AND THROAT WITH OR WITHOUT PRESENCE OF ULCERS  *URINARY PROBLEMS  *BOWEL PROBLEMS  UNUSUAL RASH Items with * indicate a potential emergency and should be followed up as soon as possible.  Feel free to call the clinic you have any questions or concerns. The clinic phone number is [phone].   Contact Dr Wilson Singer about your blood sugars.

## 2014-11-12 ENCOUNTER — Encounter: Payer: Self-pay | Admitting: Physician Assistant

## 2014-11-12 NOTE — Progress Notes (Signed)
I called Dr. Eugenio Hoes office regarding the patient's abnormal TSH level. His office already had the laboratory results. I was informed that Dr. Wilson Singer will manage the patient's abnormal TSH. I relayed this information to Dr. Julien Nordmann. Dr. Eugenio Hoes office is aware that the patient is being treated with immunotherapy in the form of Nivolumab.  Carlton Adam, PA-C 11/12/2014

## 2014-11-13 ENCOUNTER — Encounter: Payer: Self-pay | Admitting: *Deleted

## 2014-11-13 ENCOUNTER — Encounter: Payer: Self-pay | Admitting: Neurology

## 2014-11-13 ENCOUNTER — Ambulatory Visit (INDEPENDENT_AMBULATORY_CARE_PROVIDER_SITE_OTHER): Payer: 59 | Admitting: Neurology

## 2014-11-13 ENCOUNTER — Encounter: Payer: Medicare Other | Attending: Endocrinology | Admitting: *Deleted

## 2014-11-13 VITALS — BP 79/57 | HR 99 | Ht 66.0 in | Wt 133.4 lb

## 2014-11-13 DIAGNOSIS — E119 Type 2 diabetes mellitus without complications: Secondary | ICD-10-CM | POA: Diagnosis not present

## 2014-11-13 DIAGNOSIS — G4700 Insomnia, unspecified: Secondary | ICD-10-CM

## 2014-11-13 DIAGNOSIS — T451X5A Adverse effect of antineoplastic and immunosuppressive drugs, initial encounter: Secondary | ICD-10-CM

## 2014-11-13 DIAGNOSIS — G620 Drug-induced polyneuropathy: Secondary | ICD-10-CM

## 2014-11-13 DIAGNOSIS — R634 Abnormal weight loss: Secondary | ICD-10-CM

## 2014-11-13 DIAGNOSIS — G609 Hereditary and idiopathic neuropathy, unspecified: Secondary | ICD-10-CM

## 2014-11-13 MED ORDER — DRONABINOL 10 MG PO CAPS: 20.0000 mg | ORAL_CAPSULE | Freq: Two times a day (BID) | ORAL | Status: DC

## 2014-11-13 NOTE — Progress Notes (Signed)
Diabetes Self-Management Education    Appt. Start Time: 1030 Appt. End Time: 1200  11/13/2014  [name], identified by name and date of birth, is a 74 y.o. female with a diagnosis of Diabetes: Type 2.  Other people present during visit:  Patient. Megan [name] comes for DSME related to dietary intake. Her present BMI is 20. Her goal is to weight 145#. At present her weight is 127#. She lacks appetite as well as being uncertain of what is acceptable for her to eat with T2DM. In view of her low BMI I have encouraged her to eat as much as she would like in relationship to the My Plate eating plan. She is taking insulin that will cover any excess carbohydrates. Megan [name] had Stage 3 lung cancer, is physically debilitated, walks with a cane and has a daily caregiver. She takes medical marijuana for pain management.  ASSESSMENT  There were no vitals taken for this visit. There is no weight on file to calculate BMI.  Initial Visit Information:  Are you currently following a meal plan?: No Are you taking your medications as prescribed?: Yes Are you checking your feet?: No How often do you need to have someone help you when you read instructions, pamphlets, or other written materials from your doctor or pharmacy?: 1 - Never   Psychosocial:   Patient Belief/Attitude about Diabetes: Other (comment) ("Pissed off" about general health status) Self-care barriers: Unsteady gait/risk for falls Self-management support: Doctor's office, Friends, CDE visits Other persons present: Patient Patient Concerns: Nutrition/Meal planning, Medication, Weight Control Special Needs: None Preferred Learning Style: No preference indicated Learning Readiness: Ready  Complications:   Last HgB A1C per patient/outside source: 8.5 mg/dL How often do you check your blood sugar?: 0 times/day (not testing) (makes fingers too sore)  Diet Intake:  Breakfast: oatmeal, 1 wheat bread, egg Lunch: swiss  cheese grilled cheese sandwich on wheat bread Dinner: Meat, vegetable, sweet potato Beverage(s): water, juice, diet soda  Exercise:  Exercise: ADL's  Individualized Plan for Diabetes Self-Management Training:   Learning Objective:  Patient will have a greater understanding of diabetes self-management. Patient education plan per assessed needs and concerns is to attend individual sessions     Education Topics Reviewed with Patient Today:  Factors that contribute to the development of diabetes Role of diet in the treatment of diabetes and the relationship between the three main macronutrients and blood glucose level, Meal options for control of blood glucose level and chronic complications. Other (comment) (not able due to health history) Relationship between chronic complications and blood glucose control  PATIENTS GOALS/Plan (Developed by the patient):  Nutrition: General guidelines for healthy choices and portions discussed Medications: take my medication as prescribed Reducing Risk: get labs drawn  Patient Instructions  Plan:  Aim for 4 Carb Choices per meal (60 grams) +/- 1 either way  Aim for 1-2 Carbs per snack if hungry  Include protein in moderation with your meals and snacks Consider reading food labels for Total Carbohydrate and Fat Grams of foods Consider checking BG at alternate times per day as directed by MD  Continue taking medication as directed by MD  Expected Outcomes:  Demonstrated interest in learning. Expect positive outcomes  Education material provided: Meal plan card and My Plate  If problems or questions, patient to contact team via:  Phone  Future DSME appointment: PRN

## 2014-11-13 NOTE — Patient Instructions (Signed)
Plan:  Aim for 4 Carb Choices per meal (60 grams) +/- 1 either way  Aim for 1-2 Carbs per snack if hungry  Include protein in moderation with your meals and snacks Consider reading food labels for Total Carbohydrate and Fat Grams of foods Consider checking BG at alternate times per day as directed by MD  Continue taking medication as directed by MD

## 2014-11-13 NOTE — Progress Notes (Addendum)
[facility]    Provider:  Dr Jaynee Eagles Referring Provider: Anda Kraft, MD Primary Care Physician:  [name], MD  CC:  Painful neuropathy and insomnia  HPI:  Megan [name] is a 74 y.o. female here as a follow up for painful neuropathy.  Interval History 11/13/2014: She is feeling better. She is taking the marinol and can sleep at night. The pain is manageable during the day with the Gralise. She has a new diagnosis of diabetes. Her thyroid function is elevated. She takes daily B12. She is having some swelling in her legs which is manageable however it makes it difficult to drive. She still has tingling in the fingers.   Her doctors include  [facility].  Dr Hipolito Bayley Dr. Wilson Singer  Interval History 09/03/2014: She is still struggling with her peripheral neuropathy, nothing is helping. It is severe. Maybe the Gralise helped but then she ran out and didn't call for a prescription. She was recently given prednisone, cymbalta. Cymbalta made her symptoms worse. Started the Gralise and the 1800 mg worked. Belsomra didn't help. Ambien helps her sleep. Lyrica didn't work either for her peripheral neuropathy. Takes b12 daily and biotin. She is sleeping better with Ambien but the neuropathy is still severe. She can't fall asleep, possibly stress. Paresthesias are in the feet and hands. They occurred in the setting of platin-based chemotherapy. They are continuous, worse at night when sleeping. She is also very emotional, crying a lot. Her appetite is very poor.  Visit 08/13/14: This is a very pleasant 74 years old white female with history of stage IIIB non-small cell lung cancer status postcarboplatin and paclitaxel. Patient reports she had chemotherapy and radiation from [place] - April 2nd. 6 weeks later started a second set of chemo, 3 sessions every 3 weeks. The neuropathy started about 3 weeks ago. Feels like burning, pins and needles and  lightning strikes. Feet and left hand > right hand. It is in the entire feet to the ankles and entire left hand and digits 3-5 right hand. Severe, cramping in the feet and calfs. She is crying in the office today. Oxycodone not helping with the pain, doesn't even touch it. Didn't try Lyrica. Cymbalta worsened the symptoms. Nothing is helping. She is using a cane. No falls. Balance if off. The pain is severe, 10/10 and keeps her from sleeping at night. Symptoms are continuous and never subside, but are worse at night.  Reviewed notes, labs and imaging from outside physicians, which showed: 1) Concurrent chemoradiation with weekly chemotherapy in the form of carboplatin for AUC of 2 and paclitaxel 45 mg/m2. Status post 7 week of treatment. Last dose was given 01/27/2014 no significant response in her disease.  2) Consolidation chemotherapy with carboplatin for AUC of 5 and paclitaxel 175 mg/M2 every 3 weeks with Neulasta support. First dose on 04/15/2014. Status post 3 cycles. Carboplatin was discontinued starting cycle #2 secondary to hypersensitivity reaction.  Her restaging scan showed evidence for disease progression and the patient is currently undergoing immunotherapy with Nivolumab 3 mg/KG every 2 weeks is status post 3 cycle  Review of Systems: Patient complains of symptoms per HPI as well as the following symptoms: double vision, excessive thirst, hearing loss, ear pain, ringing in ears, runny nose, trouble swallowing, SOB, leg swelling, joint pain, walking difficulty, depression, nervous, anxiety, agitation. Pertinent negatives per HPI. All others negative.   History   Social History  . Marital Status: Divorced    Spouse Name: N/A  Number of Children: 0  . Years of Education: MA   Occupational History  . Retired Other   Social History Main Topics  . Smoking status: Former Smoker -- 3.00 packs/day for 45 years    Types: Cigarettes    Quit date: 12/05/1997  . Smokeless tobacco:  Never Used  . Alcohol Use: No  . Drug Use: No  . Sexual Activity: Not Currently   Other Topics Concern  . Not on file   Social History Narrative   Patient lives at home alone.   Caffeine Use: 2 cups daily    Family History  Problem Relation Age of Onset  . Hypertension Other   . Stroke Other   . Heart attack Other   . Hemachromatosis Other   . Rheum arthritis      both sets of grandparents and father  . Other Mother   . Other Father     failure to thrive    Past Medical History  Diagnosis Date  . Hypertension   . Diabetes mellitus   . Rheumatoid arthritis   . GERD (gastroesophageal reflux disease)     no meds for  . Hx of radiation therapy 12/16/13-01/30/14    lung 66Gy  . Cancer     LUNG  . Neuropathy   . Shortness of breath dyspnea     with exertion  . History of hiatal hernia     Past Surgical History  Procedure Laterality Date  . Cholecystectomy    . Abdominal hysterectomy    . Dilation and curettage of uterus    . Abdominal surgery    . Tonsillectomy    . Esophagogastroduodenoscopy (egd) with propofol N/A 10/09/2014    Procedure: ESOPHAGOGASTRODUODENOSCOPY (EGD) WITH PROPOFOL;  Surgeon: Inda Castle, MD;  Location: [facility];  Service: Endoscopy;  Laterality: N/A;  . Savory dilation N/A 10/09/2014    Procedure: SAVORY DILATION;  Surgeon: Inda Castle, MD;  Location: [facility];  Service: Endoscopy;  Laterality: N/A;    Current Outpatient Prescriptions  Medication Sig Dispense Refill  . B-D ULTRAFINE III SHORT PEN 31G X 8 MM MISC   0  . Biotin 5000 MCG CAPS Take 1 capsule by mouth daily.     . cefUROXime (CEFTIN) 500 MG tablet Take 500 mg by mouth 2 (two) times daily with a meal.    . docusate sodium (COLACE) 100 MG capsule Take 1 capsule (100 mg total) by mouth every 12 (twelve) hours. 60 capsule 0  . dronabinol (MARINOL) 10 MG capsule Take 2 capsules (20 mg total) by mouth 2 (two) times daily before a meal. 120 capsule 5  .  Fluticasone-Salmeterol (ADVAIR DISKUS) 100-50 MCG/DOSE AEPB Inhale 1 puff into the lungs 2 (two) times daily. 60 each 5  . Gabapentin, Once-Daily, (GRALISE) 600 MG TABS Take 1,800 mg by mouth daily. 90 tablet 6  . HUMALOG KWIKPEN 100 UNIT/ML KiwkPen   0  . ibuprofen (ADVIL,MOTRIN) 600 MG tablet Take 1 tablet (600 mg total) by mouth every 6 (six) hours as needed. Take with food. 30 tablet 0  . lansoprazole (PREVACID SOLUTAB) 30 MG disintegrating tablet Take 1 tablet (30 mg total) by mouth daily. 90 tablet 3  . LEVEMIR FLEXTOUCH 100 UNIT/ML Pen   0  . lidocaine (LIDODERM) 5 % PLACE 3 PATCHES ONTO THE SKIN EVERY DAY. REMOVE AND DISCARD PATCH 12 HOURS LATER. ON FOR 12 HOURS, OFF FOR 12 HOURS. MAY CUT PATCH TO FIT 90 patch 1  . losartan-hydrochlorothiazide (HYZAAR)  100-25 MG per tablet Take 1 tablet by mouth daily.    . meloxicam (MOBIC) 15 MG tablet Take 15 mg by mouth daily.    . metFORMIN (GLUCOPHAGE) 500 MG tablet Take 1 tablet (500 mg total) by mouth 2 (two) times daily with a meal. 60 tablet 1  . pantoprazole (PROTONIX) 40 MG tablet Take 1 tablet (40 mg total) by mouth daily. 90 tablet 0  . potassium chloride SA (K-DUR,KLOR-CON) 20 MEQ tablet Take 40 mEq by mouth daily.    . prochlorperazine (COMPAZINE) 10 MG tablet Take 10 mg by mouth every 6 (six) hours as needed for nausea or vomiting.    . vitamin B-12 (CYANOCOBALAMIN) 100 MCG tablet Take 100 mcg by mouth daily.    [name] zolpidem (AMBIEN) 10 MG tablet Take 10 mg by mouth at bedtime as needed for sleep.     No current facility-administered medications for this visit.   Facility-Administered Medications Ordered in Other Visits  Medication Dose Route Frequency Provider Last Rate Last Dose  . sodium chloride 0.9 % injection 10 mL  10 mL Intracatheter PRN Curt Bears, MD   10 mL at 12/30/13 1121    Allergies as of 11/13/2014 - Review Complete 11/13/2014  Allergen Reaction Noted  . Remicade [infliximab] Anaphylaxis 05/13/2012  . Carboplatin   05/29/2014  . Hydromorphone Rash 08/11/2014    Vitals: BP 79/57 mmHg  Pulse 99  Ht 5\' 6"  (1.676 m)  Wt 133 lb 6.4 oz (60.51 kg)  BMI 21.54 kg/m2 Last Weight:  Wt Readings from Last 1 Encounters:  11/13/14 133 lb 6.4 oz (60.51 kg)   Last Height:   Ht Readings from Last 1 Encounters:  11/13/14 5\' 6"  (1.676 m)     Assessment/Plan: This is a very pleasant 74 years old white female with history of stage IIIB non-small cell lung cancer status postcarboplatin and paclitaxel with resultant severe neuropathy. Exam shows severe loss of sensation in the distal extremities, +Romberg, Ataxic gait and Allodynia. Very distressing to patient, . Keeps her up at night with pain. Gralise has helped with the pain during the day. Marinol has helped with her pain and insomnia however she is still waking up. She has gained weight since starting the marinol.   1. Will continue Gralise 1800mg  daily 2. Will continue Marinol for appetite, neuropathy and anxiety. Will help with her appetite loss. Will also help with her anxiety and there is literature which shows it may also be effective for pain management. Discussed the common side effects include abuse and addiction. Patient would like to increase dose. She tolerates it well. No dizzyness. It significantly helps with pain. Warned that it can cause orthostatic hypotension, seizures, dizziness, confusion, elevated heart rate and other serious and potentially life-threatening side effects and to be very careful with dosing. Max dosing for nausea is 15mg /m2, patient is 1.37m2 = max 27mg  per dose. Do not want to go this high. Gave her option to try 15mg  dose, she would like to try 20mg  dose at night to help her sleep with the excruciating pain. Warned to decrease to 10mg  if does not tolerate.  3. Could try to start Tegretol at a very low dose which may help the tingling that is left in her fingers. Would like to discuss with her other physicians first, Dr. Wilson Singer and Dr.  Earlie Server, as Tegretol has some significant potential side effects such as hyponatremia, leukopenia. 3. 3 months follow up or sooner if needed  Sarina Ill, MD  [facility] [address] [place], [place] [address]  Phone [phone] Fax [phone]  A total of 20 minutes was spent in with this patient. Over half this time was spent on counseling patient on the diagnosis and different therapeutic options available.

## 2014-11-14 LAB — B12 AND FOLATE PANEL
Folate: 11.9 ng/mL (ref >3.0)
Vitamin B-12: >1999 pg/mL — ABNORMAL HIGH (ref 211–946)

## 2014-11-15 ENCOUNTER — Other Ambulatory Visit: Payer: Self-pay | Admitting: Neurology

## 2014-11-24 ENCOUNTER — Other Ambulatory Visit (HOSPITAL_BASED_OUTPATIENT_CLINIC_OR_DEPARTMENT_OTHER): Payer: Medicare Other

## 2014-11-24 ENCOUNTER — Ambulatory Visit (HOSPITAL_BASED_OUTPATIENT_CLINIC_OR_DEPARTMENT_OTHER): Payer: Medicare Other | Admitting: Physician Assistant

## 2014-11-24 ENCOUNTER — Encounter: Payer: Self-pay | Admitting: Physician Assistant

## 2014-11-24 ENCOUNTER — Telehealth: Payer: Self-pay | Admitting: Internal Medicine

## 2014-11-24 ENCOUNTER — Ambulatory Visit (HOSPITAL_BASED_OUTPATIENT_CLINIC_OR_DEPARTMENT_OTHER): Payer: Medicare Other

## 2014-11-24 VITALS — BP 107/69 | HR 100 | Resp 18 | Ht 66.0 in | Wt 132.0 lb

## 2014-11-24 DIAGNOSIS — C3491 Malignant neoplasm of unspecified part of right bronchus or lung: Secondary | ICD-10-CM

## 2014-11-24 DIAGNOSIS — Z5112 Encounter for antineoplastic immunotherapy: Secondary | ICD-10-CM

## 2014-11-24 DIAGNOSIS — Z79899 Other long term (current) drug therapy: Secondary | ICD-10-CM

## 2014-11-24 DIAGNOSIS — R739 Hyperglycemia, unspecified: Secondary | ICD-10-CM

## 2014-11-24 DIAGNOSIS — C342 Malignant neoplasm of middle lobe, bronchus or lung: Secondary | ICD-10-CM

## 2014-11-24 DIAGNOSIS — G629 Polyneuropathy, unspecified: Secondary | ICD-10-CM

## 2014-11-24 LAB — CBC WITH DIFFERENTIAL/PLATELET
BASO%: 0.2 % (ref 0.0–2.0)
Basophils Absolute: 0 10*3/uL (ref 0.0–0.1)
EOS ABS: 0 10*3/uL (ref 0.0–0.5)
EOS%: 0 % (ref 0.0–7.0)
HEMATOCRIT: 39.8 % (ref 34.8–46.6)
HEMOGLOBIN: 13.1 g/dL (ref 11.6–15.9)
LYMPH%: 10.9 % — ABNORMAL LOW (ref 14.0–49.7)
MCH: 30 pg (ref 25.1–34.0)
MCHC: 32.9 g/dL (ref 31.5–36.0)
MCV: 91.1 fL (ref 79.5–101.0)
MONO#: 0.6 10*3/uL (ref 0.1–0.9)
MONO%: 8.7 % (ref 0.0–14.0)
NEUT#: 5.2 10*3/uL (ref 1.5–6.5)
NEUT%: 80.2 % — ABNORMAL HIGH (ref 38.4–76.8)
Platelets: 188 10*3/uL (ref 145–400)
RBC: 4.37 10*6/uL (ref 3.70–5.45)
RDW: 15.9 % — AB (ref 11.2–14.5)
WBC: 6.5 10*3/uL (ref 3.9–10.3)
lymph#: 0.7 10*3/uL — ABNORMAL LOW (ref 0.9–3.3)

## 2014-11-24 LAB — COMPREHENSIVE METABOLIC PANEL (CC13)
ALBUMIN: 3.4 g/dL — AB (ref 3.5–5.0)
ALT: 29 U/L (ref 0–55)
ANION GAP: 12 meq/L — AB (ref 3–11)
AST: 24 U/L (ref 5–34)
Alkaline Phosphatase: 88 U/L (ref 40–150)
BUN: 17.5 mg/dL (ref 7.0–26.0)
CO2: 24 mEq/L (ref 22–29)
CREATININE: 0.7 mg/dL (ref 0.6–1.1)
Calcium: 8.8 mg/dL (ref 8.4–10.4)
Chloride: 105 mEq/L (ref 98–109)
EGFR: 83 mL/min/{1.73_m2} — AB (ref >90)
Glucose: 179 mg/dl — ABNORMAL HIGH (ref 70–140)
POTASSIUM: 3.9 meq/L (ref 3.5–5.1)
SODIUM: 140 meq/L (ref 136–145)
Total Bilirubin: 0.57 mg/dL (ref 0.20–1.20)
Total Protein: 5.7 g/dL — ABNORMAL LOW (ref 6.4–8.3)

## 2014-11-24 LAB — TSH CHCC: TSH: 3.221 m(IU)/L (ref 0.308–3.960)

## 2014-11-24 MED ORDER — SODIUM CHLORIDE 0.9 % IV SOLN
Freq: Once | INTRAVENOUS | Status: AC
  Administered 2014-11-24: 12:00:00 via INTRAVENOUS

## 2014-11-24 MED ORDER — SODIUM CHLORIDE 0.9 % IV SOLN
3.0000 mg/kg | Freq: Once | INTRAVENOUS | Status: AC
  Administered 2014-11-24: 180 mg via INTRAVENOUS
  Filled 2014-11-24: qty 18

## 2014-11-24 NOTE — Telephone Encounter (Signed)
gv pt avs report for feb. AJ aware no availabiligy w/MM 28 and pt scheduled w/[facility].

## 2014-11-24 NOTE — Patient Instructions (Signed)
[facility] Discharge Instructions for Patients Receiving Chemotherapy  Today you received the following chemotherapy agents nivolumab   To help prevent nausea and vomiting after your treatment, we encourage you to take your nausea medication as directed   If you develop nausea and vomiting that is not controlled by your nausea medication, call the clinic.   BELOW ARE SYMPTOMS THAT SHOULD BE REPORTED IMMEDIATELY:  *FEVER GREATER THAN 100.5 F  *CHILLS WITH OR WITHOUT FEVER  NAUSEA AND VOMITING THAT IS NOT CONTROLLED WITH YOUR NAUSEA MEDICATION  *UNUSUAL SHORTNESS OF BREATH  *UNUSUAL BRUISING OR BLEEDING  TENDERNESS IN MOUTH AND THROAT WITH OR WITHOUT PRESENCE OF ULCERS  *URINARY PROBLEMS  *BOWEL PROBLEMS  UNUSUAL RASH Items with * indicate a potential emergency and should be followed up as soon as possible.  Feel free to call the clinic you have any questions or concerns. The clinic phone number is [phone].  

## 2014-11-24 NOTE — Progress Notes (Addendum)
[facility] [phone]   [phone]  OFFICE PROGRESS NOTE  [name], MD [address] [place] [address] [place] [address]  DIAGNOSIS: Squamous cell lung cancer  Primary site: Lung (Right)  Staging method: AJCC 7th Edition  Clinical: Stage IIIB (T3, N3, M0)  Summary: Stage IIIB (T3, N3, M0)   PRIOR THERAPY:  1) Concurrent chemoradiation with weekly chemotherapy in the form of carboplatin for AUC of 2 and paclitaxel 45 mg/m2. Status post 7 week of treatment. Last dose was given 01/27/2014 no significant response in her disease.  2) Consolidation chemotherapy with carboplatin for AUC of 5 and paclitaxel 175 mg/M2 every 3 weeks with Neulasta support. First dose on 04/15/2014. Status post 3 cycles. Carboplatin was discontinued starting cycle #2 secondary to hypersensitivity reaction.  CURRENT THERAPY: Immunotherapy with Opdivo (Nivolumab) 3mg /kg given every 2 weeks. Status post 10 cycles.  CHEMOTHERAPY INTENT: Control/Palliative  CURRENT # OF CHEMOTHERAPY CYCLES: 11 CURRENT ANTIEMETICS: Zofran, dexamethasone, Compazine  CURRENT SMOKING STATUS: Former smoker, quit 12/05/1997  ORAL CHEMOTHERAPY AND CONSENT: n/a   CURRENT BISPHOSPHONATES USE: None  PAIN MANAGEMENT: no cancer related pain, patient does have RA  NARCOTICS INDUCED CONSTIPATION: none  LIVING WILL AND CODE STATUS: ?   INTERVAL HISTORY: Megan [name] 74 y.o. female returns to the clinic today for followup visit accompanied by her significant other. The patient is doing fine today was no specific complaints except for the persistent peripheral neuropathy. She is currently on gabapentin 600 mg by mouth twice a day. She is also seeing a neurologist for the symptoms. She also has uncontrolled blood sugar recently. She was seen by her primary care physician and started on insulin sliding scale in addition to metformin. Her primary care physician, Dr. Wilson Singer is  monitoring/treating her elevated TSH. The patient denied having any significant chest pain but has shortness of breath with exertion with no cough or hemoptysis. She denied having any significant weight loss or night sweats. She has no nausea or vomiting. She is today to start cycle #11 of her treatment with Nivolumab.  MEDICAL HISTORY: Past Medical History  Diagnosis Date  . Hypertension   . Diabetes mellitus   . Rheumatoid arthritis   . GERD (gastroesophageal reflux disease)     no meds for  . Hx of radiation therapy 12/16/13-01/30/14    lung 66Gy  . Cancer     LUNG  . Neuropathy   . Shortness of breath dyspnea     with exertion  . History of hiatal hernia     ALLERGIES:  is allergic to remicade; carboplatin; and hydromorphone.  MEDICATIONS:  Current Outpatient Prescriptions  Medication Sig Dispense Refill  . B-D ULTRAFINE III SHORT PEN 31G X 8 MM MISC   0  . Biotin 5000 MCG CAPS Take 1 capsule by mouth daily.     . cefUROXime (CEFTIN) 500 MG tablet Take 500 mg by mouth 2 (two) times daily with a meal.    . docusate sodium (COLACE) 100 MG capsule Take 1 capsule (100 mg total) by mouth every 12 (twelve) hours. 60 capsule 0  . dronabinol (MARINOL) 10 MG capsule Take 2 capsules (20 mg total) by mouth 2 (two) times daily before a meal. 120 capsule 5  . Fluticasone-Salmeterol (ADVAIR DISKUS) 100-50 MCG/DOSE AEPB Inhale 1 puff into the lungs 2 (two) times daily. 60 each 5  . Gabapentin, Once-Daily, (GRALISE) 600 MG TABS Take 1,800 mg by mouth daily. 90 tablet 6  .  HUMALOG KWIKPEN 100 UNIT/ML KiwkPen   0  . ibuprofen (ADVIL,MOTRIN) 600 MG tablet Take 1 tablet (600 mg total) by mouth every 6 (six) hours as needed. Take with food. 30 tablet 0  . lansoprazole (PREVACID SOLUTAB) 30 MG disintegrating tablet Take 1 tablet (30 mg total) by mouth daily. 90 tablet 3  . LEVEMIR FLEXTOUCH 100 UNIT/ML Pen   0  . lidocaine (LIDODERM) 5 % PLACE 3 PATCHES ONTO THE SKIN EVERY DAY. LEAVE ON FOR 12 HOURS  AND OFF FOR 12 HOURS. MAY CUT PATCH TO FIT 90 patch 1  . losartan-hydrochlorothiazide (HYZAAR) 100-25 MG per tablet Take 1 tablet by mouth daily.    . meloxicam (MOBIC) 15 MG tablet Take 15 mg by mouth daily.    . metFORMIN (GLUCOPHAGE) 500 MG tablet Take 1 tablet (500 mg total) by mouth 2 (two) times daily with a meal. 60 tablet 1  . pantoprazole (PROTONIX) 40 MG tablet Take 1 tablet (40 mg total) by mouth daily. 90 tablet 0  . potassium chloride SA (K-DUR,KLOR-CON) 20 MEQ tablet Take 40 mEq by mouth daily.    . prochlorperazine (COMPAZINE) 10 MG tablet Take 10 mg by mouth every 6 (six) hours as needed for nausea or vomiting.    [name] UNABLE TO FIND Med Name: Metanex- Vitamin B    . vitamin B-12 (CYANOCOBALAMIN) 100 MCG tablet Take 100 mcg by mouth daily.    [name] zolpidem (AMBIEN) 10 MG tablet Take 10 mg by mouth at bedtime as needed for sleep.     No current facility-administered medications for this visit.   Facility-Administered Medications Ordered in Other Visits  Medication Dose Route Frequency Provider Last Rate Last Dose  . sodium chloride 0.9 % injection 10 mL  10 mL Intracatheter PRN Curt Bears, MD   10 mL at 12/30/13 1121    SURGICAL HISTORY:  Past Surgical History  Procedure Laterality Date  . Cholecystectomy    . Abdominal hysterectomy    . Dilation and curettage of uterus    . Abdominal surgery    . Tonsillectomy    . Esophagogastroduodenoscopy (egd) with propofol N/A 10/09/2014    Procedure: ESOPHAGOGASTRODUODENOSCOPY (EGD) WITH PROPOFOL;  Surgeon: Inda Castle, MD;  Location: [facility];  Service: Endoscopy;  Laterality: N/A;  . Savory dilation N/A 10/09/2014    Procedure: SAVORY DILATION;  Surgeon: Inda Castle, MD;  Location: [facility];  Service: Endoscopy;  Laterality: N/A;    REVIEW OF SYSTEMS:  Constitutional: negative Eyes: negative Ears, nose, mouth, throat, and face: negative Respiratory: negative Cardiovascular: negative Gastrointestinal:  negative Genitourinary:negative Integument/breast: negative Hematologic/lymphatic: negative Musculoskeletal:negative Neurological: positive for paresthesia Behavioral/Psych: negative Endocrine: negative Allergic/Immunologic: negative   PHYSICAL EXAMINATION: General appearance: alert, cooperative and no distress Head: Normocephalic, without obvious abnormality, atraumatic Neck: no adenopathy, no JVD, supple, symmetrical, trachea midline and thyroid not enlarged, symmetric, no tenderness/mass/nodules Lymph nodes: Cervical, supraclavicular, and axillary nodes normal. Resp: clear to auscultation bilaterally Back: symmetric, no curvature. ROM normal. No CVA tenderness. Cardio: regular rate and rhythm, S1, S2 normal, no murmur, click, rub or gallop GI: soft, non-tender; bowel sounds normal; no masses,  no organomegaly Extremities: extremities normal, atraumatic, no cyanosis or edema Neurologic: Alert and oriented X 3, normal strength and tone. Normal symmetric reflexes. Normal coordination and gait  ECOG PERFORMANCE STATUS: 1 - Symptomatic but completely ambulatory  Blood pressure 107/69, pulse 100, resp. rate 18, height 5\' 6"  (1.676 m), weight 132 lb (59.875 kg), SpO2 96 %.  LABORATORY DATA: Lab  Results  Component Value Date   WBC 6.5 11/24/2014   HGB 13.1 11/24/2014   HCT 39.8 11/24/2014   MCV 91.1 11/24/2014   PLT 188 11/24/2014      Chemistry      Component Value Date/Time   NA 140 11/24/2014 1005   NA 138 10/09/2014 1328   K 3.9 11/24/2014 1005   K 3.7 10/09/2014 1328   CL 92* 09/08/2014 1417   CO2 24 11/24/2014 1005   CO2 28 09/08/2014 1417   BUN 17.5 11/24/2014 1005   BUN 15 09/08/2014 1417   CREATININE 0.7 11/24/2014 1005   CREATININE 0.67 09/08/2014 1417   GLU 400* 11/10/2014 1127      Component Value Date/Time   CALCIUM 8.8 11/24/2014 1005   CALCIUM 9.6 09/08/2014 1417   ALKPHOS 88 11/24/2014 1005   ALKPHOS 118* 09/08/2014 1417   AST 24 11/24/2014 1005    AST 17 09/08/2014 1417   ALT 29 11/24/2014 1005   ALT 25 09/08/2014 1417   BILITOT 0.57 11/24/2014 1005   BILITOT 0.4 09/08/2014 1417       RADIOGRAPHIC STUDIES:  ASSESSMENT AND PLAN: This is a very pleasant 74 years old white female with history of stage IIIB non-small cell lung cancer status post a course of concurrent chemoradiation with weekly carboplatin and paclitaxel status post 7 cycles. The patient tolerated her treatment fairly well with no significant adverse effects except for the radiation induced esophagitis.  She was also treated with 3 cycles of consolidation chemotherapy with carboplatin and paclitaxel but carboplatin was discontinued the start of cycle #2 secondary to hypersensitivity reaction. Her restaging scan showed evidence for disease progression and the patient is currently undergoing immunotherapy with Nivolumab 3 mg/KG every 2 weeks is status post 10 cycles. Patient was discussed with and also seen by Dr. Julien Nordmann. She will proceed with cycle #11 today as scheduled. I recommended for her to proceed with cycle #10 today as scheduled. For the peripheral neuropathy, she will continue treatment with Neurontin,Percocet and ibuprofen. She'll follow-up with her neurologist as previously scheduled. For the hyperglycemia, she will follow-up with her primary care physician for more adjustment of her antidiabetic medications. She had elevated TSH level and this is being monitored/treated by her primary care physician Dr. Wilson Singer who is also an endocrinologist. TSH from today is pending. These labs will be sent to Dr. Eugenio Hoes office. She will follow-up  in 2 weeks for reevaluation before starting the next dose of her chemotherapy She was advised to call immediately if she has any concerning symptoms in the interval. The patient voices understanding of current disease status and treatment options and is in agreement with the current care plan.  All questions were answered. The  patient knows to call the clinic with any problems, questions or concerns. We can certainly see the patient much sooner if necessary.  [place] Adam, PA-C 11/24/2014  ADDENDUM: Hematology/Oncology Attending: I had a face to face encounter with the patient today. I recommended her care plan. this is a very pleasant 74 years old white female with metastatic non-small cell lung cancer currently undergoing immunotherapy with Nivolumab every 2 weeks is status post 10 cycles. The patient history rating her treatment well with no significant complaints except for the persistent peripheral neuropathy from prior treatment. She denied having any significant skin rash or diarrhea . I recommended for her to proceed with cycle #11 today as a scheduled. She will come back for follow-up visit in 2 weeks with  the start of cycle #12. She was advised to call immediately if she has any concerning symptoms in the interval.   Disclaimer: This note was dictated with voice recognition software. Similar sounding words can inadvertently be transcribed and may not be corrected upon review. Eilleen Kempf., MD 11/24/2014

## 2014-11-24 NOTE — Patient Instructions (Signed)
Continue labs and immunotherapy as scheduled Follow-up in 2 weeks prior to next scheduled cycle of immunotherapy

## 2014-11-25 ENCOUNTER — Ambulatory Visit: Payer: Medicare Other | Admitting: Internal Medicine

## 2014-11-26 ENCOUNTER — Telehealth: Payer: Self-pay | Admitting: Neurology

## 2014-11-26 ENCOUNTER — Encounter: Payer: Self-pay | Admitting: Internal Medicine

## 2014-11-26 ENCOUNTER — Ambulatory Visit (INDEPENDENT_AMBULATORY_CARE_PROVIDER_SITE_OTHER): Payer: Medicare Other | Admitting: Internal Medicine

## 2014-11-26 VITALS — BP 98/60 | HR 92 | Ht 66.0 in | Wt 133.0 lb

## 2014-11-26 DIAGNOSIS — R530 Neoplastic (malignant) related fatigue: Secondary | ICD-10-CM

## 2014-11-26 DIAGNOSIS — J439 Emphysema, unspecified: Secondary | ICD-10-CM | POA: Insufficient documentation

## 2014-11-26 MED ORDER — LAMOTRIGINE 25 MG PO TABS: ORAL_TABLET | ORAL | Status: DC

## 2014-11-26 NOTE — Patient Instructions (Addendum)
ICD-9-CM ICD-10-CM   1. Pulmonary emphysema, unspecified emphysema type 492.8 J43.9   2. Neoplastic malignant related fatigue 780.79 R53.0    - cotninue advair  - refer pulmonary rehab at [facility]; will ask for expedited start - buy optimum nutrtition whey protein powder at [facility] and have 1 scoop daily  Followup  2 months or sooner if needed

## 2014-11-26 NOTE — Telephone Encounter (Signed)
Perfect Dr Jaynee Eagles

## 2014-11-26 NOTE — Telephone Encounter (Signed)
Spoke to patient, will start Lamictal which will hopefully help with her mood as well as her neuropathy. This medication is well tolerated but need to titrate slowly to avoid rash and other side effects. Will start 25mg  daily then increase to 50mg  daily in 2 weeks and reevaluate patient at next appointment. I will cc Dr. Chase Caller on this phone note to make sure he is fine with this addition to her medications.    Dr. Chase Caller, please let me know if you have any reservations about starting Lamictal.  Don't hesitate to call me at [phone].Thank you!  Megan Dom, MD [facility] neurology

## 2014-11-26 NOTE — Progress Notes (Signed)
Subjective:    Patient ID: Megan [name], female    DOB: [birth year], 74 y.o.   MRN: [id number]  HPI   OV 11/26/2014  Chief Complaint  Patient presents with  . Follow-up    Pt stated her mobility has decreased. Pt c/o DOE, dry cough. Pt denies CP/tightness.     FU COOPD, Lung cancer with resultant neuropathy, fatigue, etc.. (declined hospice consideration dec 2015). Currently in Nivolumab Rx.    She seems a bit better overall. Her female friend Mr Elnora Morrison is with her today. SHe is now several months into Nivolumab Rx. ECOG is 3 - >70% if time in chair or bed during waking hours per female friend. This is an improvement. Possibly related to home PT visitiing her but she denies this was the case. Home PT is complete. SHe is now keen to join [facility]. She feels it helped her immensely in past. Peripheral neuropathy still a problem - her neurologist wrote to me and wondered about starting lamictal; I am fine wit hthat. (she has failed cymbalta and lyrica in past). Mood some better. Dyspnea is not the major problem of all things below      [place] Symptom Assessment Numerical Scale 0 is no problem -> 10 worst problem 07/29/2014   08/27/2014  09/30/2014   No Pain -> Worst pain 6, in mid scapula, left shoulder, buttock and thigh and feet - did have platin neuropathy 8.5 7, some better  No Tiredness -> Worset tiredness 9 7 9, worse  No Nausea -> Worst nausea 1 0 9, worse  No Depression -> Worst depression 4 9 9, same  No Anxiety -> Worst Anxiety 6 9 7, same  No Drowsiness -> Worst Drowsiness 10 7 9, worse  Best appetite-> Worst Appetitle 7 10 8  same  Best Feeling of well being -> Worst feeling 1 6 7, worse  No dyspnea-> Worst dyspnea 9 8 4, better  Other problem (none -> severe) none Tachycardia - Physical therapy refuses to work wit her. Now in AECOPD with wheeze on exam 10 nos  Completed by  MR/patent MR/patient  patient  comments  All worse Some better, some worse     Review of Systems  Constitutional: Negative for fever and unexpected weight change.  HENT: Negative for congestion, dental problem, ear pain, nosebleeds, postnasal drip, rhinorrhea, sinus pressure, sneezing, sore throat and trouble swallowing.   Eyes: Negative for redness and itching.  Respiratory: Positive for cough and shortness of breath. Negative for chest tightness and wheezing.   Cardiovascular: Negative for palpitations and leg swelling.  Gastrointestinal: Negative for nausea and vomiting.  Genitourinary: Negative for dysuria.  Musculoskeletal: Negative for joint swelling.  Skin: Negative for rash.  Neurological: Negative for headaches.  Hematological: Does not bruise/bleed easily.  Psychiatric/Behavioral: Negative for dysphoric mood. The patient is not nervous/anxious.     Current outpatient prescriptions:  .  B-D ULTRAFINE III SHORT PEN 31G X 8 MM MISC, , Disp: , Rfl: 0 .  dronabinol (MARINOL) 10 MG capsule, Take 2 capsules (20 mg total) by mouth 2 (two) times daily before a meal., Disp: 120 capsule, Rfl: 5 .  Gabapentin, Once-Daily, (GRALISE) 600 MG TABS, Take 1,800 mg by mouth daily., Disp: 90 tablet, Rfl: 6 .  HUMALOG KWIKPEN 100 UNIT/ML KiwkPen, , Disp: , Rfl: 0 .  ibuprofen (ADVIL,MOTRIN) 600 MG tablet, Take 1 tablet (600 mg total) by mouth every 6 (six) hours as needed. Take with food., Disp: 30  tablet, Rfl: 0 .  lansoprazole (PREVACID SOLUTAB) 30 MG disintegrating tablet, Take 1 tablet (30 mg total) by mouth daily. (Patient taking differently: Take 30 mg by mouth daily as needed. ), Disp: 90 tablet, Rfl: 3 .  LEVEMIR FLEXTOUCH 100 UNIT/ML Pen, , Disp: , Rfl: 0 .  lidocaine (LIDODERM) 5 %, PLACE 3 PATCHES ONTO THE SKIN EVERY DAY. LEAVE ON FOR 12 HOURS AND OFF FOR 12 HOURS. MAY CUT PATCH TO FIT, Disp: 90 patch, Rfl: 1 .  losartan-hydrochlorothiazide (HYZAAR) 100-25 MG per tablet, Take 1 tablet by mouth daily.,  Disp: , Rfl:  .  meloxicam (MOBIC) 15 MG tablet, Take 15 mg by mouth daily as needed. , Disp: , Rfl:  .  pantoprazole (PROTONIX) 40 MG tablet, Take 1 tablet (40 mg total) by mouth daily. (Patient taking differently: Take 40 mg by mouth daily as needed. ), Disp: 90 tablet, Rfl: 0 .  potassium chloride SA (K-DUR,KLOR-CON) 20 MEQ tablet, Take 40 mEq by mouth daily., Disp: , Rfl:  .  prochlorperazine (COMPAZINE) 10 MG tablet, Take 10 mg by mouth every 6 (six) hours as needed for nausea or vomiting., Disp: , Rfl:  .  UNABLE TO FIND, Med Name: Metanex- Vitamin B, Disp: , Rfl:  .  zolpidem (AMBIEN) 10 MG tablet, Take 10 mg by mouth at bedtime as needed for sleep., Disp: , Rfl:  .  docusate sodium (COLACE) 100 MG capsule, Take 1 capsule (100 mg total) by mouth every 12 (twelve) hours. (Patient not taking: Reported on 11/26/2014), Disp: 60 capsule, Rfl: 0 .  Fluticasone-Salmeterol (ADVAIR DISKUS) 100-50 MCG/DOSE AEPB, Inhale 1 puff into the lungs 2 (two) times daily. (Patient not taking: Reported on 11/26/2014), Disp: 60 each, Rfl: 5 No current facility-administered medications for this visit.  Facility-Administered Medications Ordered in Other Visits:  .  sodium chloride 0.9 % injection 10 mL, 10 mL, Intracatheter, PRN, Curt Bears, MD, 10 mL at 12/30/13 1121     Objective:   Physical Exam  Constitutional: She is oriented to person, place, and time. She appears well-developed and well-nourished. No distress.  Frail No longer in wheelchair  HENT:  Head: Normocephalic and atraumatic.  Right Ear: External ear normal.  Left Ear: External ear normal.  Mouth/Throat: Oropharynx is clear and moist. No oropharyngeal exudate.  Wig +  Eyes: Conjunctivae and EOM are normal. Pupils are equal, round, and reactive to light. Right eye exhibits no discharge. Left eye exhibits no discharge. No scleral icterus.  Neck: Normal range of motion. Neck supple. No JVD present. No tracheal deviation present. No  thyromegaly present.  Cardiovascular: Normal rate, regular rhythm, normal heart sounds and intact distal pulses.  Exam reveals no gallop and no friction rub.   No murmur heard. Pulmonary/Chest: Effort normal and breath sounds normal. No respiratory distress. She has no wheezes. She has no rales. She exhibits no tenderness.  Abdominal: Soft. Bowel sounds are normal. She exhibits no distension and no mass. There is no tenderness. There is no rebound and no guarding.  Musculoskeletal: Normal range of motion. She exhibits no edema or tenderness.  Walks with cane  Lymphadenopathy:    She has no cervical adenopathy.  Neurological: She is alert and oriented to person, place, and time. She has normal reflexes. No cranial nerve deficit. She exhibits normal muscle tone. Coordination normal.  Skin: Skin is warm and dry. No rash noted. She is not diaphoretic. No erythema. No pallor.  Psychiatric: She has a normal mood and affect. Her behavior  is normal. Judgment and thought content normal.  Improved mood  Vitals reviewed.    Filed Vitals:   11/26/14 1239  BP: 98/60  Pulse: 92  Height: 5\' 6"  (1.676 m)  Weight: 133 lb (60.328 kg)  SpO2: 97%   Estimated body mass index is 21.48 kg/(m^2) as calculated from the following:   Height as of this encounter: 5\' 6"  (1.676 m).   Weight as of this encounter: 133 lb (60.328 kg).      Assessment & Plan:     ICD-9-CM ICD-10-CM   1. Pulmonary emphysema, unspecified emphysema type 492.8 J43.9 AMB referral to rehabilitation  2. Neoplastic malignant related fatigue 780.79 R53.0 AMB referral to rehabilitation   - cotninue advair  - refer pulmonary rehab at [facility]; will ask for expedited start - buy optimum nutrtition whey protein powder at [facility] and have 1 scoop daily  Followup  2 months or sooner if needed   > 50% of this > 25 min visit spent in face to face counseling or coordination of care (15 min visit converted to 25 min)   Dr. Brand Males,  M.D., [facility].C.P Pulmonary and Critical Care Medicine Staff Physician [place] [facility]monary [facility] Pager: [phone], If no answer or between  15:00h - 7:00h: call [phone]  11/27/2014 5:39 AM

## 2014-11-28 ENCOUNTER — Telehealth: Payer: Self-pay | Admitting: Internal Medicine

## 2014-11-28 ENCOUNTER — Telehealth: Payer: Self-pay | Admitting: Gastroenterology

## 2014-11-28 ENCOUNTER — Telehealth (HOSPITAL_COMMUNITY): Payer: Self-pay

## 2014-11-28 NOTE — Telephone Encounter (Signed)
Spoke with patient. She would like me to call her Monday morning when she is at home and has a calendar available.

## 2014-11-28 NOTE — Telephone Encounter (Signed)
Spoke with patient regarding Pulmonary Rehab.  Patient is interested in getting started.  Sent order to Dr. Chase Caller.

## 2014-11-28 NOTE — Telephone Encounter (Signed)
Called cardiopulmonary, molly was not available to speak at this time.  Daneil Dan, do you have this form or know about this form?

## 2014-12-01 NOTE — Telephone Encounter (Signed)
Patient contacted and scheduled to her needs. EGD with ballon dilation 12/12/14.

## 2014-12-01 NOTE — Telephone Encounter (Signed)
lmtcb for [employer]. Form in MR's look-at's.

## 2014-12-02 NOTE — Telephone Encounter (Signed)
Received form. Completed form faxed back to [facility]. Nothing further needed.

## 2014-12-02 NOTE — Telephone Encounter (Signed)
Received call from [facility]. Megan [name] is requesting form be faxed over today. Megan [name] is faxing over another form to be signed today by MR. Once signed will fax.

## 2014-12-08 ENCOUNTER — Ambulatory Visit (HOSPITAL_BASED_OUTPATIENT_CLINIC_OR_DEPARTMENT_OTHER): Payer: Medicare Other | Admitting: Oncology

## 2014-12-08 ENCOUNTER — Encounter: Payer: Self-pay | Admitting: Oncology

## 2014-12-08 ENCOUNTER — Other Ambulatory Visit (HOSPITAL_BASED_OUTPATIENT_CLINIC_OR_DEPARTMENT_OTHER): Payer: Medicare Other

## 2014-12-08 ENCOUNTER — Ambulatory Visit (HOSPITAL_BASED_OUTPATIENT_CLINIC_OR_DEPARTMENT_OTHER): Payer: Medicare Other

## 2014-12-08 VITALS — BP 109/56 | HR 95 | Temp 97.9°F | Resp 18 | Ht 66.0 in | Wt 129.8 lb

## 2014-12-08 DIAGNOSIS — C770 Secondary and unspecified malignant neoplasm of lymph nodes of head, face and neck: Secondary | ICD-10-CM

## 2014-12-08 DIAGNOSIS — E1165 Type 2 diabetes mellitus with hyperglycemia: Secondary | ICD-10-CM

## 2014-12-08 DIAGNOSIS — G629 Polyneuropathy, unspecified: Secondary | ICD-10-CM

## 2014-12-08 DIAGNOSIS — C342 Malignant neoplasm of middle lobe, bronchus or lung: Secondary | ICD-10-CM

## 2014-12-08 DIAGNOSIS — Z5112 Encounter for antineoplastic immunotherapy: Secondary | ICD-10-CM

## 2014-12-08 DIAGNOSIS — C3491 Malignant neoplasm of unspecified part of right bronchus or lung: Secondary | ICD-10-CM

## 2014-12-08 LAB — CBC WITH DIFFERENTIAL/PLATELET
BASO%: 0.5 % (ref 0.0–2.0)
BASOS ABS: 0 10*3/uL (ref 0.0–0.1)
EOS ABS: 0 10*3/uL (ref 0.0–0.5)
EOS%: 0.4 % (ref 0.0–7.0)
HEMATOCRIT: 38.2 % (ref 34.8–46.6)
HGB: 12.5 g/dL (ref 11.6–15.9)
LYMPH%: 8.5 % — ABNORMAL LOW (ref 14.0–49.7)
MCH: 30 pg (ref 25.1–34.0)
MCHC: 32.8 g/dL (ref 31.5–36.0)
MCV: 91.3 fL (ref 79.5–101.0)
MONO#: 0.6 10*3/uL (ref 0.1–0.9)
MONO%: 12.9 % (ref 0.0–14.0)
NEUT%: 77.7 % — ABNORMAL HIGH (ref 38.4–76.8)
NEUTROS ABS: 3.9 10*3/uL (ref 1.5–6.5)
Platelets: 145 10*3/uL (ref 145–400)
RBC: 4.18 10*6/uL (ref 3.70–5.45)
RDW: 15.6 % — AB (ref 11.2–14.5)
WBC: 5 10*3/uL (ref 3.9–10.3)
lymph#: 0.4 10*3/uL — ABNORMAL LOW (ref 0.9–3.3)

## 2014-12-08 LAB — COMPREHENSIVE METABOLIC PANEL (CC13)
ALT: 26 U/L (ref 0–55)
ANION GAP: 10 meq/L (ref 3–11)
AST: 20 U/L (ref 5–34)
Albumin: 3.2 g/dL — ABNORMAL LOW (ref 3.5–5.0)
Alkaline Phosphatase: 109 U/L (ref 40–150)
BUN: 15 mg/dL (ref 7.0–26.0)
CALCIUM: 9 mg/dL (ref 8.4–10.4)
CHLORIDE: 104 meq/L (ref 98–109)
CO2: 24 meq/L (ref 22–29)
Creatinine: 0.8 mg/dL (ref 0.6–1.1)
EGFR: 79 mL/min/{1.73_m2} — AB (ref >90)
GLUCOSE: 208 mg/dL — AB (ref 70–140)
Potassium: 4 mEq/L (ref 3.5–5.1)
Sodium: 139 mEq/L (ref 136–145)
TOTAL PROTEIN: 6 g/dL — AB (ref 6.4–8.3)
Total Bilirubin: 0.58 mg/dL (ref 0.20–1.20)

## 2014-12-08 LAB — TSH CHCC: TSH: 2.325 m(IU)/L (ref 0.308–3.960)

## 2014-12-08 MED ORDER — SODIUM CHLORIDE 0.9 % IV SOLN
Freq: Once | INTRAVENOUS | Status: AC
  Administered 2014-12-08: 11:00:00 via INTRAVENOUS

## 2014-12-08 MED ORDER — SODIUM CHLORIDE 0.9 % IV SOLN
3.0000 mg/kg | Freq: Once | INTRAVENOUS | Status: AC
  Administered 2014-12-08: 180 mg via INTRAVENOUS
  Filled 2014-12-08: qty 18

## 2014-12-08 NOTE — Patient Instructions (Signed)
[place] Discharge Instructions for Patients Receiving Chemotherapy  Today you received the following chemotherapy agents nivolumab  To help prevent nausea and vomiting after your treatment, we encourage you to take your nausea medication if needed   If you develop nausea and vomiting that is not controlled by your nausea medication, call the clinic.   BELOW ARE SYMPTOMS THAT SHOULD BE REPORTED IMMEDIATELY:  *FEVER GREATER THAN 100.5 F  *CHILLS WITH OR WITHOUT FEVER  NAUSEA AND VOMITING THAT IS NOT CONTROLLED WITH YOUR NAUSEA MEDICATION  *UNUSUAL SHORTNESS OF BREATH  *UNUSUAL BRUISING OR BLEEDING  TENDERNESS IN MOUTH AND THROAT WITH OR WITHOUT PRESENCE OF ULCERS  *URINARY PROBLEMS  *BOWEL PROBLEMS  UNUSUAL RASH Items with * indicate a potential emergency and should be followed up as soon as possible.  Feel free to call the clinic you have any questions or concerns. The clinic phone number is [phone].   Contact Dr Wilson Singer about your blood sugars.

## 2014-12-08 NOTE — Progress Notes (Signed)
Ok to treat per Cyril Mourning, NP

## 2014-12-08 NOTE — Progress Notes (Signed)
[place] Telephone:([phone]   [phone]  OFFICE PROGRESS NOTE  [name], MD [address] [place] [address] [place] [address]  DIAGNOSIS: Squamous cell lung cancer  Primary site: Lung (Right)  Staging method: AJCC 7th Edition  Clinical: Stage IIIB (T3, N3, M0)  Summary: Stage IIIB (T3, N3, M0)   PRIOR THERAPY:  1) Concurrent chemoradiation with weekly chemotherapy in the form of carboplatin for AUC of 2 and paclitaxel 45 mg/m2. Status post 7 week of treatment. Last dose was given 01/27/2014 no significant response in her disease.  2) Consolidation chemotherapy with carboplatin for AUC of 5 and paclitaxel 175 mg/M2 every 3 weeks with Neulasta support. First dose on 04/15/2014. Status post 3 cycles. Carboplatin was discontinued starting cycle #2 secondary to hypersensitivity reaction.  CURRENT THERAPY: Immunotherapy with Opdivo (Nivolumab) 3mg /kg given every 2 weeks. Status post 11 cycles.  CHEMOTHERAPY INTENT: Control/Palliative  CURRENT # OF CHEMOTHERAPY CYCLES: 12 CURRENT ANTIEMETICS: Zofran, dexamethasone, Compazine  CURRENT SMOKING STATUS: Former smoker, quit 12/05/1997  ORAL CHEMOTHERAPY AND CONSENT: n/a   CURRENT BISPHOSPHONATES USE: None  PAIN MANAGEMENT: no cancer related pain, patient does have RA  NARCOTICS INDUCED CONSTIPATION: none  LIVING WILL AND CODE STATUS: ?   INTERVAL HISTORY: Megan [name] 74 y.o. female returns to the clinic today for followup visit accompanied by her friend. The patient is doing fine today was no specific complaints except for the persistent peripheral neuropathy. She is currently on gabapentin 600 mg by mouth twice a day. She is also seeing a neurologist for the symptoms. She also has uncontrolled blood sugar recently. PCP is following this and has her on Levemir. Her primary care physician, Dr. Wilson Singer is monitoring/treating her elevated TSH. The patient denied having any significant chest  pain but has shortness of breath with exertion with no cough or hemoptysis. She denied having any significant weight loss or night sweats. She has no nausea or vomiting. She is today to start cycle #12 of her treatment with Nivolumab.  MEDICAL HISTORY: Past Medical History  Diagnosis Date  . Hypertension   . Diabetes mellitus   . Rheumatoid arthritis   . GERD (gastroesophageal reflux disease)     no meds for  . Hx of radiation therapy 12/16/13-01/30/14    lung 66Gy  . Cancer     LUNG  . Neuropathy   . Shortness of breath dyspnea     with exertion  . History of hiatal hernia     ALLERGIES:  is allergic to remicade; carboplatin; and hydromorphone.  MEDICATIONS:  Current Outpatient Prescriptions  Medication Sig Dispense Refill  . B-D ULTRAFINE III SHORT PEN 31G X 8 MM MISC   0  . dronabinol (MARINOL) 10 MG capsule Take 2 capsules (20 mg total) by mouth 2 (two) times daily before a meal. 120 capsule 5  . Fluticasone-Salmeterol (ADVAIR DISKUS) 100-50 MCG/DOSE AEPB Inhale 1 puff into the lungs 2 (two) times daily. 60 each 5  . Gabapentin, Once-Daily, (GRALISE) 600 MG TABS Take 1,800 mg by mouth daily. 90 tablet 6  . HUMALOG KWIKPEN 100 UNIT/ML KiwkPen   0  . lamoTRIgine (LAMICTAL) 25 MG tablet Take 1 tablet daily x 14 days, then take 2 tablets daily x 14 days 60 tablet 6  . LEVEMIR FLEXTOUCH 100 UNIT/ML Pen   0  . lidocaine (LIDODERM) 5 % PLACE 3 PATCHES ONTO THE SKIN EVERY DAY. LEAVE ON FOR 12 HOURS AND OFF FOR 12 HOURS. MAY  CUT PATCH TO FIT 90 patch 1  . losartan-hydrochlorothiazide (HYZAAR) 100-25 MG per tablet Take 1 tablet by mouth daily.    . meloxicam (MOBIC) 15 MG tablet Take 15 mg by mouth daily as needed.     . pantoprazole (PROTONIX) 40 MG tablet Take 1 tablet (40 mg total) by mouth daily. (Patient taking differently: Take 40 mg by mouth daily as needed. ) 90 tablet 0  . potassium chloride SA (K-DUR,KLOR-CON) 20 MEQ tablet Take 40 mEq by mouth daily.    . prochlorperazine  (COMPAZINE) 10 MG tablet Take 10 mg by mouth every 6 (six) hours as needed for nausea or vomiting.    [name] zolpidem (AMBIEN) 10 MG tablet Take 10 mg by mouth at bedtime as needed for sleep.    [name] docusate sodium (COLACE) 100 MG capsule Take 1 capsule (100 mg total) by mouth every 12 (twelve) hours. (Patient not taking: Reported on 11/26/2014) 60 capsule 0  . ibuprofen (ADVIL,MOTRIN) 600 MG tablet Take 1 tablet (600 mg total) by mouth every 6 (six) hours as needed. Take with food. (Patient not taking: Reported on 12/08/2014) 30 tablet 0  . lansoprazole (PREVACID SOLUTAB) 30 MG disintegrating tablet Take 1 tablet (30 mg total) by mouth daily. (Patient not taking: Reported on 12/08/2014) 90 tablet 3  . UNABLE TO FIND Med Name: Metanex- Vitamin B     No current facility-administered medications for this visit.   Facility-Administered Medications Ordered in Other Visits  Medication Dose Route Frequency Provider Last Rate Last Dose  . sodium chloride 0.9 % injection 10 mL  10 mL Intracatheter PRN Curt Bears, MD   10 mL at 12/30/13 1121    SURGICAL HISTORY:  Past Surgical History  Procedure Laterality Date  . Cholecystectomy    . Abdominal hysterectomy    . Dilation and curettage of uterus    . Abdominal surgery    . Tonsillectomy    . Esophagogastroduodenoscopy (egd) with propofol N/A 10/09/2014    Procedure: ESOPHAGOGASTRODUODENOSCOPY (EGD) WITH PROPOFOL;  Surgeon: Inda Castle, MD;  Location: [facility];  Service: Endoscopy;  Laterality: N/A;  . Savory dilation N/A 10/09/2014    Procedure: SAVORY DILATION;  Surgeon: Inda Castle, MD;  Location: [facility];  Service: Endoscopy;  Laterality: N/A;    REVIEW OF SYSTEMS:  Constitutional: negative Eyes: negative Ears, nose, mouth, throat, and face: negative Respiratory: negative Cardiovascular: negative Gastrointestinal: negative Genitourinary:negative Integument/breast: negative Hematologic/lymphatic:  negative Musculoskeletal:negative Neurological: positive for paresthesia Behavioral/Psych: negative Endocrine: negative Allergic/Immunologic: negative   PHYSICAL EXAMINATION: General appearance: alert, cooperative and no distress Head: Normocephalic, without obvious abnormality, atraumatic Neck: no adenopathy, no JVD, supple, symmetrical, trachea midline and thyroid not enlarged, symmetric, no tenderness/mass/nodules Lymph nodes: Cervical, supraclavicular, and axillary nodes normal. Resp: clear to auscultation bilaterally Back: symmetric, no curvature. ROM normal. No CVA tenderness. Cardio: regular rate and rhythm, S1, S2 normal, no murmur, click, rub or gallop GI: soft, non-tender; bowel sounds normal; no masses,  no organomegaly Extremities: extremities normal, atraumatic, no cyanosis or edema Neurologic: Alert and oriented X 3, normal strength and tone. Normal symmetric reflexes. Normal coordination and gait  ECOG PERFORMANCE STATUS: 1 - Symptomatic but completely ambulatory  Blood pressure 109/56, pulse 95, temperature 97.9 F (36.6 C), temperature source Oral, resp. rate 18, height 5\' 6"  (1.676 m), weight 129 lb 12.8 oz (58.877 kg).  LABORATORY DATA: Lab Results  Component Value Date   WBC 5.0 12/08/2014   HGB 12.5 12/08/2014   HCT 38.2 12/08/2014  MCV 91.3 12/08/2014   PLT 145 12/08/2014      Chemistry      Component Value Date/Time   NA 139 12/08/2014 0914   NA 138 10/09/2014 1328   K 4.0 12/08/2014 0914   K 3.7 10/09/2014 1328   CL 92* 09/08/2014 1417   CO2 24 12/08/2014 0914   CO2 28 09/08/2014 1417   BUN 15.0 12/08/2014 0914   BUN 15 09/08/2014 1417   CREATININE 0.8 12/08/2014 0914   CREATININE 0.67 09/08/2014 1417   GLU 400* 11/10/2014 1127      Component Value Date/Time   CALCIUM 9.0 12/08/2014 0914   CALCIUM 9.6 09/08/2014 1417   ALKPHOS 109 12/08/2014 0914   ALKPHOS 118* 09/08/2014 1417   AST 20 12/08/2014 0914   AST 17 09/08/2014 1417   ALT 26  12/08/2014 0914   ALT 25 09/08/2014 1417   BILITOT 0.58 12/08/2014 0914   BILITOT 0.4 09/08/2014 1417       RADIOGRAPHIC STUDIES:  ASSESSMENT AND PLAN: This is a very pleasant 74 year old white female with history of stage IIIB non-small cell lung cancer status post a course of concurrent chemoradiation with weekly carboplatin and paclitaxel status post 7 cycles. The patient tolerated her treatment fairly well with no significant adverse effects except for the radiation induced esophagitis.  She was also treated with 3 cycles of consolidation chemotherapy with carboplatin and paclitaxel but carboplatin was discontinued the start of cycle #2 secondary to hypersensitivity reaction. Her restaging scan showed evidence for disease progression and the patient is currently undergoing immunotherapy with Nivolumab 3 mg/KG every 2 weeks is status post 11 cycles.  Patient was discussed with and also seen by Dr. Julien Nordmann. She will proceed with cycle #12 today as scheduled. Restaging CT scan will be scheduled prior to her next cycle of Nivolumab.   For the peripheral neuropathy, she will continue treatment with Neurontin,Percocet and ibuprofen. She'll follow-up with her neurologist as previously scheduled.  For the hyperglycemia, she will follow-up with her primary care physician for more adjustment of her antidiabetic medications.  She had elevated TSH level and this is being monitored/treated by her primary care physician Dr. Wilson Singer who is also an endocrinologist. TSH from today is pending. These labs will be sent to Dr. Eugenio Hoes office.  She will follow-up  in 2 weeks for reevaluation before starting the next dose of her chemotherapy She was advised to call immediately if she has any concerning symptoms in the interval. The patient voices understanding of current disease status and treatment options and is in agreement with the current care plan.  All questions were answered. The patient knows to call  the clinic with any problems, questions or concerns. We can certainly see the patient much sooner if necessary.  Mikey Bussing, DNP, AGPCNP-BC, AOCNP  12/08/2014  ADDENDUM: Hematology/Oncology Attending: I had a face to face encounter with the patient today. I recommended her care plan. This is a very pleasant 74 years old white female with metastatic non-small cell lung cancer, squamous cell carcinoma currently on treatment with immunotherapy with Nivolumab status post 11 cycles. She is tolerating her treatment well with no significant adverse effects. She continues to have persistent neuropathy from previous treatment and diabetes mellitus.  I recommended for the patient to proceed with cycle #12 today as scheduled. She will come back for follow-up visit in 2 weeks for evaluation after repeating CT scan of the chest for restaging of her disease. She was advised to call  immediately if she has any concerning symptoms in the interval.  Disclaimer: This note was dictated with voice recognition software. Similar sounding words can inadvertently be transcribed and may be missed upon review. Eilleen Kempf., MD 12/08/2014

## 2014-12-10 ENCOUNTER — Telehealth: Payer: Self-pay | Admitting: Internal Medicine

## 2014-12-10 NOTE — Telephone Encounter (Signed)
S/w pt confirming labs/ov per 02/09 POF, sent msg to add chemo KJ

## 2014-12-12 ENCOUNTER — Encounter (HOSPITAL_COMMUNITY): Payer: Self-pay | Admitting: Gastroenterology

## 2014-12-12 ENCOUNTER — Ambulatory Visit (HOSPITAL_COMMUNITY)
Admission: RE | Admit: 2014-12-12 | Discharge: 2014-12-12 | Disposition: A | Discharge status: Home / Self Care | Payer: Medicare Other | Source: Ambulatory Visit | Attending: Gastroenterology | Admitting: Gastroenterology

## 2014-12-12 ENCOUNTER — Encounter (HOSPITAL_COMMUNITY)
Admission: RE | Disposition: A | Discharge status: Home / Self Care | Payer: Self-pay | Source: Ambulatory Visit | Attending: Gastroenterology

## 2014-12-12 ENCOUNTER — Ambulatory Visit (HOSPITAL_COMMUNITY): Payer: Medicare Other

## 2014-12-12 DIAGNOSIS — Z885 Allergy status to narcotic agent status: Secondary | ICD-10-CM | POA: Diagnosis not present

## 2014-12-12 DIAGNOSIS — Z888 Allergy status to other drugs, medicaments and biological substances status: Secondary | ICD-10-CM | POA: Insufficient documentation

## 2014-12-12 DIAGNOSIS — R1310 Dysphagia, unspecified: Secondary | ICD-10-CM | POA: Diagnosis present

## 2014-12-12 DIAGNOSIS — K222 Esophageal obstruction: Secondary | ICD-10-CM | POA: Diagnosis not present

## 2014-12-12 DIAGNOSIS — E119 Type 2 diabetes mellitus without complications: Secondary | ICD-10-CM | POA: Insufficient documentation

## 2014-12-12 DIAGNOSIS — Z87891 Personal history of nicotine dependence: Secondary | ICD-10-CM | POA: Insufficient documentation

## 2014-12-12 DIAGNOSIS — K219 Gastro-esophageal reflux disease without esophagitis: Secondary | ICD-10-CM | POA: Insufficient documentation

## 2014-12-12 DIAGNOSIS — Z85118 Personal history of other malignant neoplasm of bronchus and lung: Secondary | ICD-10-CM | POA: Insufficient documentation

## 2014-12-12 DIAGNOSIS — I10 Essential (primary) hypertension: Secondary | ICD-10-CM | POA: Diagnosis not present

## 2014-12-12 DIAGNOSIS — M069 Rheumatoid arthritis, unspecified: Secondary | ICD-10-CM | POA: Diagnosis not present

## 2014-12-12 HISTORY — PX: BALLOON DILATION: SHX5330

## 2014-12-12 HISTORY — PX: ESOPHAGOGASTRODUODENOSCOPY: SHX5428

## 2014-12-12 SURGERY — EGD (ESOPHAGOGASTRODUODENOSCOPY)
Anesthesia: Moderate Sedation

## 2014-12-12 MED ORDER — SODIUM CHLORIDE 0.9 % IV SOLN: INTRAVENOUS | Status: DC

## 2014-12-12 MED ORDER — SODIUM CHLORIDE 0.9 % IV SOLN
INTRAVENOUS | Status: DC
  Administered 2014-12-12: 12:00:00 via INTRAVENOUS

## 2014-12-12 MED ORDER — FENTANYL CITRATE 0.05 MG/ML IJ SOLN
INTRAMUSCULAR | Status: AC
  Filled 2014-12-12: qty 2

## 2014-12-12 MED ORDER — MIDAZOLAM HCL 10 MG/2ML IJ SOLN
INTRAMUSCULAR | Status: AC
Start: 2014-12-12 — End: 2014-12-12
  Filled 2014-12-12: qty 2

## 2014-12-12 MED ORDER — DIPHENHYDRAMINE HCL 50 MG/ML IJ SOLN
INTRAMUSCULAR | Status: AC
  Filled 2014-12-12: qty 1

## 2014-12-12 MED ORDER — FENTANYL CITRATE 0.05 MG/ML IJ SOLN
INTRAMUSCULAR | Status: DC | PRN
  Administered 2014-12-12: 25 ug via INTRAVENOUS

## 2014-12-12 MED ORDER — DIPHENHYDRAMINE HCL 50 MG/ML IJ SOLN
INTRAMUSCULAR | Status: DC | PRN
  Administered 2014-12-12: 25 mg via INTRAVENOUS

## 2014-12-12 MED ORDER — MIDAZOLAM HCL 10 MG/2ML IJ SOLN
INTRAMUSCULAR | Status: DC | PRN
  Administered 2014-12-12: 2 mg via INTRAVENOUS

## 2014-12-12 NOTE — H&P (Signed)
_                                                                                                                History of Present Illness:  Megan [name] is a 74 year old white female with a radiation-induced esophageal stricture here for follow-up dilation.  At last dilation and December, 2015 she was dilated to 15 mm.  She's complaining of recurrent dysphagia solids and liquids.   Past Medical History  Diagnosis Date  . Hypertension   . Diabetes mellitus   . Rheumatoid arthritis   . GERD (gastroesophageal reflux disease)     no meds for  . Hx of radiation therapy 12/16/13-01/30/14    lung 66Gy  . Cancer     LUNG  . Neuropathy   . Shortness of breath dyspnea     with exertion  . History of hiatal hernia    Past Surgical History  Procedure Laterality Date  . Cholecystectomy    . Abdominal hysterectomy    . Dilation and curettage of uterus    . Abdominal surgery    . Tonsillectomy    . Esophagogastroduodenoscopy (egd) with propofol N/A 10/09/2014    Procedure: ESOPHAGOGASTRODUODENOSCOPY (EGD) WITH PROPOFOL;  Surgeon: Inda Castle, MD;  Location: [facility];  Service: Endoscopy;  Laterality: N/A;  . Savory dilation N/A 10/09/2014    Procedure: SAVORY DILATION;  Surgeon: Inda Castle, MD;  Location: [facility];  Service: Endoscopy;  Laterality: N/A;   family history includes Heart attack in her other; Hemachromatosis in her other; Hypertension in her other; Other in her father and mother; Rheum arthritis in an other family member; Stroke in her other. Current Facility-Administered Medications  Medication Dose Route Frequency Provider Last Rate Last Dose  . 0.9 %  sodium chloride infusion   Intravenous Continuous Inda Castle, MD 100 mL/hr at 12/12/14 1212     Facility-Administered Medications Ordered in Other Encounters  Medication Dose Route Frequency Provider Last Rate Last Dose  . sodium chloride 0.9 % injection 10 mL  10 mL Intracatheter PRN  Curt Bears, MD   10 mL at 12/30/13 1121   Allergies as of 12/01/2014 - Review Complete 11/26/2014  Allergen Reaction Noted  . Remicade [infliximab] Anaphylaxis 05/13/2012  . Carboplatin  05/29/2014  . Hydromorphone Rash 08/11/2014    reports that she quit smoking about 17 years ago. Her smoking use included Cigarettes. She has a 135 pack-year smoking history. She has never used smokeless tobacco. She reports that she does not drink alcohol or use illicit drugs.   Review of Systems: Pertinent positive and negative review of systems were noted in the above HPI section. All other review of systems were otherwise negative.  Vital signs were reviewed in today's medical record Physical Exam: General: Well developed , well nourished, no acute distress Skin: anicteric Head: Normocephalic and atraumatic Eyes:  sclerae anicteric, EOMI Ears: Normal auditory acuity Mouth: No deformity or lesions Neck:  Supple, no masses or thyromegaly Lungs: Clear throughout to auscultation Heart: Regular rate and rhythm; no murmurs, rubs or bruits Abdomen: Soft, non tender and non distended. No masses, hepatosplenomegaly or hernias noted. Normal Bowel sounds Rectal:deferred Musculoskeletal: Symmetrical with no gross deformities  Skin: No lesions on visible extremities Pulses:  Normal pulses noted Extremities: No clubbing, cyanosis, edema or deformities noted Neurological: Alert oriented x 4, grossly nonfocal Cervical Nodes:  No significant cervical adenopathy Inguinal Nodes: No significant inguinal adenopathy Psychological:  Alert and cooperative. Normal mood and affect  Impression #1 radiation-induced esophageal stricture #2 lung cancer  Plan EGD with Savary dilation under fluoroscopic guidance

## 2014-12-12 NOTE — Discharge Instructions (Signed)
Esophageal Dilatation The esophagus is the long, narrow tube which carries food and liquid from the mouth to the stomach. Esophageal dilatation is the technique used to stretch a blocked or narrowed portion of the esophagus. This procedure is used when a part of the esophagus has become so narrow that it becomes difficult, painful or even impossible to swallow. This is generally an uncomplicated form of treatment. When this is not successful, chest surgery may be required. This is a much more extensive form of treatment with a longer recovery time. CAUSES  Some of the more common causes of blockage or strictures of the esophagus are:  Narrowing from longstanding inflammation (soreness and redness) of the lower esophagus. This comes from the constant exposure of the lower esophagus to the acid which bubbles up from the stomach. Over time this causes scarring and narrowing of the lower esophagus.  Hiatal hernia in which a small part of the stomach bulges (herniates) up through the diaphragm. This can cause a gradual narrowing of the end of the esophagus.  Schatzki ring is a narrow ring of benign (non-cancerous) fibrous tissue which constricts the lower esophagus. The reason for this is not known.  Scleroderma is a connective tissue disorder that affects the esophagus and makes swallowing difficult.  Achalasia is an absence of nerves to the lower esophagus and to the esophageal sphincter. This is the circular muscle between the stomach and esophagus that relaxes to allow food into the stomach. After swallowing, it contracts to keep food in the stomach. This absence of nerves may be congenital (present since birth). This can cause irregular spasms of the lower esophageal muscle. This spasm does not open up to allow food and fluid through. The result is a persistent blockage with subsequent slow trickling of the esophageal contents into the stomach.  Strictures may develop from swallowing materials which  damage the esophagus. Some examples are strong acids or alkalis such as lye.  Growths such as benign (non-cancerous) and malignant (cancerous) tumors can block the esophagus.  Hereditary (present since birth) causes. DIAGNOSIS  Your caregiver often suspects this problem by taking a medical history. They will also do a physical exam. They can then prove their suspicions using X-rays and endoscopy. Endoscopy is an exam in which a tube like a small, flexible telescope is used to look at your esophagus.  TREATMENT There are different stretching (dilating) techniques that can be used. Simple bougie dilatation may be done in the office. This usually takes only a couple minutes. A numbing (anesthetic) spray of the throat is used. Endoscopy, when done, is done in an endoscopy suite under mild sedation. When fluoroscopy is used, the procedure is performed in X-ray. Other techniques require a little longer time. Recovery is usually quick. There is no waiting time to begin eating and drinking to test success of the treatment. Following are some of the methods used. Narrowing of the esophagus is treated by making it bigger. Commonly this is a mechanical problem which can be treated with stretching. This can be done in different ways. Your caregiver will discuss these with you. Some of the means used are:  A series of graduated (increasing thickness) flexible dilators can be used. These are weighted tubes passed through the esophagus into the stomach. The tubes used become progressively larger until the desired stretched size is reached. Graduated dilators are a simple and quick way of opening the esophagus. No visualization is required.  Another method is the use of endoscopy to place  a flexible wire across the stricture. The endoscope is removed and the wire left in place. A dilator with a hole through it from end to end is guided down the esophagus and across the stricture. One or more of these dilators are  passed over the wire. At the end of the exam, the wire is removed. This type of treatment may be performed in the X-ray department under fluoroscopy. An advantage of this procedure is the examiner is visualizing the end opening in the esophagus. °· Stretching of the esophagus may be done using balloons. Deflated balloons are placed through the endoscope and across the stricture. This type of balloon dilatation is often done at the time of endoscopy or fluoroscopy. Flexible endoscopy allows the examiner to directly view the stricture. A balloon is inserted in the deflated form into the area of narrowing. It is then inflated with air to a certain pressure that is preset for a given circumference. When inflated, it becomes sausage shaped, stretched, and makes the stricture larger. °· Achalasia requires a longer, larger balloon-type dilator. This is frequently done under X-ray control. In this situation, the spastic muscle fibers in the lower esophagus are stretched. °All of the above procedures make the passage of food and water into the stomach easier. They also make it easier for stomach contents to reflux back into the esophagus. Special medications may be used following the procedure to help prevent further stricturing. Proton-pump inhibitor medications are good at decreasing the amount of acid in the stomach juice. When stomach juice refluxes into the esophagus, the juice is no longer as acidic and is less likely to burn or scar the esophagus. °RISKS AND COMPLICATIONS °Esophageal dilatation is usually performed effectively and without problems. Some complications that can occur are: °· A small amount of bleeding almost always happens where the stretching takes place. If this is too excessive it may require more aggressive treatment. °· An uncommon complication is perforation (making a hole) of the esophagus. The esophagus is thin. It is easy to make a hole in it. If this happens, an operation may be necessary to  repair this. °· A small, undetected perforation could lead to an infection in the chest. This can be very serious. °HOME CARE INSTRUCTIONS  °· If you received sedation for your procedure, do not drive, make important decisions, or perform any activities requiring your full coordination. Do not drink alcohol, take sedatives, or use any mind altering chemicals unless instructed by your caregiver. °· You may use throat lozenges or warm salt water gargles if you have throat discomfort. °· You can begin eating and drinking normally on return home unless instructed otherwise. Do not purposely try to force large chunks of food down to test the benefits of your procedure. °· Mild discomfort can be eased with sips of ice water. °· Medications for discomfort may or may not be needed. °SEEK IMMEDIATE MEDICAL CARE IF:  °· You begin vomiting up blood. °· You develop black, tarry stools. °· You develop chills or an unexplained temperature of over 101°F (38.3°C) °· You develop chest or abdominal pain. °· You develop shortness of breath, or feel light-headed or faint. °· Your swallowing is becoming more painful, difficult, or you are unable to swallow. °MAKE SURE YOU:  °· Understand these instructions. °· Will watch your condition. °· Will get help right away if you are not doing well or get worse. °Document Released: 12/08/2005 Document Revised: 03/03/2014 Document Reviewed: 01/25/2006 °ExitCare® Patient Information ©2015 [employer].   This information is not intended to replace advice given to you by your health care provider. Make sure you discuss any questions you have with your health care provider. ° °

## 2014-12-12 NOTE — Op Note (Addendum)
[facility] [place], [address]   ENDOSCOPY WITH DILATION PROCEDURE REPORT  PATIENT: Megan [name], Megan [name]  MR#: [id number] BIRTHDATE: 11-30-1940 , 74  yrs. old GENDER: female ENDOSCOPIST: Inda Castle, MD ASSISTANT:   Bryan Lemma Cristopher Estimable REFERRED [id number] Julien Nordmann, M.D. PROCEDURE DATE:  01/04/2015 PROCEDURE:   EGD, diagnostic and Savary dilation of esophagus ASA CLASS:   Class III INDICATIONS:dilation of a radiation?"induced stricture. MEDICATIONS: Versed 2 mg IV, Fentanyl 25 mcg IV, and Benadryl 25 mg IV TOPICAL ANESTHETIC:   Cetacaine Spray  DESCRIPTION OF PROCEDURE:   After the risks benefits and alternatives of the procedure were thoroughly explained, informed consent was obtained.  The Pentax Gastroscope [id number]  endoscope was introduced through the mouth  and advanced to the second portion of the duodenum , limited by Without limitations.   The instrument was slowly withdrawn as the mucosa was carefully examined.    Again noted was a stricture beginning at approximately 25 cm from the incisors and extending 6-7 cm.  The 9 mm gastroscope easily traversed the stricture.  Mucosa was erythematous.   Except for the findings listed the EGD was otherwise normal.  Dilation was then performed at the proximal esophagus  Dilator:Savary over guidewire Size:14-16-17mm  Reststance:moderate Heme:yes  dilators were passed under fluoroscopic guidance.  There was minimal heme on the 17 mm dilator.  COMPLICATIONS: There were no immediate complications.  ENDOSCOPIC IMPRESSION: 1.   radiation?"induced esophageal stricture?"status post Savary dilation  RECOMMENDATIONS: repeat dilation as needed  eSigned:  Inda Castle, MD 01/04/15 1:09 PM Revised: 01/04/2015 1:09 PM CC:  CPT CODES: ICD CODES:  The ICD and CPT codes recommended by this software are interpretations from the data that the clinical staff has  captured with the software.  The verification of the translation of this report to the ICD and CPT codes and modifiers is the sole responsibility of the health care institution and practicing physician where this report was generated.  [employer]. will not be held responsible for the validity of the ICD and CPT codes included on this report.  AMA assumes no liability for data contained or not contained herein. CPT is a Designer, television/film set of the [employer].  PATIENT NAME:  Megan [name], Megan [name] MR#: [id number]

## 2014-12-12 NOTE — Progress Notes (Signed)
Pt refusing to allow check of blood glucose..I have been unable to reach this patient by phone.  A letter is being sent. To obtain blood from IV insertion.. Inserted w/out tourniquet d/t pt neuropathy.Marland KitchenMarland KitchenMarland Kitchen

## 2014-12-15 ENCOUNTER — Encounter (HOSPITAL_COMMUNITY): Payer: Self-pay | Admitting: Gastroenterology

## 2014-12-15 ENCOUNTER — Ambulatory Visit (HOSPITAL_COMMUNITY): Payer: Medicare Other

## 2014-12-15 ENCOUNTER — Encounter (HOSPITAL_COMMUNITY)
Admission: RE | Admit: 2014-12-15 | Discharge: 2014-12-15 | Disposition: A | Discharge status: Home / Self Care | Payer: Medicare Other | Source: Ambulatory Visit | Attending: Internal Medicine | Admitting: Internal Medicine

## 2014-12-15 VITALS — BP 88/50 | HR 89

## 2014-12-15 DIAGNOSIS — J439 Emphysema, unspecified: Secondary | ICD-10-CM | POA: Diagnosis not present

## 2014-12-15 DIAGNOSIS — C3490 Malignant neoplasm of unspecified part of unspecified bronchus or lung: Secondary | ICD-10-CM | POA: Diagnosis not present

## 2014-12-15 NOTE — Progress Notes (Addendum)
Megan [name] 74 y.o. female Pulmonary Rehab Orientation Note Patient arrived today in Cardiac and Pulmonary Rehab for orientation to Pulmonary Rehab. She was walked from the parking deck with her care giver, Gladys Damme. She does not carry portable oxygen. Per pt, she uses oxygen never. Color good, skin warm and dry. Patient is oriented to time and place. Patient's medical history and medications reviewed. Heart rate is normal, breath sounds clear to auscultation, no wheezes, rales, or rhonchi. Her blood pressure was low today, 82/40, no complaints of dizziness.  I gave patient 12 ounces of gatorade and rechecked BP and it only increased to 84/48.  She says she has had diarrhea for the last 3 days following a esophogeal stricture stretching procedure 3 days ago.  She has taken Immodium once.  Stressed to patient to take Immodium after each loose stool until they stop and to drink lots of fluids the next several days.  Still no complaints of dizziness.  Rechecked BP upon leaving which was again 86/50.    Grip strength equal, strong. Distal pulses 1+ bilateral pedal pulses present. Patient reports she does take medications as prescribed. Patient states she follows a Regular diet. The patient has been trying to gain weight by eating more and takes marinol to increase her appetite.. Patient's weight will be monitored closely. Demonstration and practice of PLB using pulse oximeter. Patient able to return demonstration satisfactorily. Safety and hand hygiene in the exercise area reviewed with patient. Patient voices understanding of the information reviewed. Department expectations discussed with patient and achievable goals were set.  Patient initially refused have CBG checked while in the program.  I stressed to patient that she cannot participate in the exercise sessions without checking CBG before exercising and after.  I also stressed that she will need to eat a high protein lunch before exercising to  keep her CBG from drastically decreasing with exercise.  She agrees to the requirements to participate in the program.    The patient shows indifference about attending the program and we look forward to working with this nice lady. The patient is scheduled for a 6 min walk test on Thursday, December 18, 2014 @ 3:15 pm and to begin exercise on Tuesday, December 23, 2014 in the 1:30pm class. [place] RN

## 2014-12-16 ENCOUNTER — Encounter (HOSPITAL_COMMUNITY)
Admission: RE | Admit: 2014-12-16 | Discharge: 2014-12-16 | Disposition: A | Discharge status: Home / Self Care | Payer: Medicare Other | Source: Ambulatory Visit | Attending: Internal Medicine | Admitting: Internal Medicine

## 2014-12-16 DIAGNOSIS — C3490 Malignant neoplasm of unspecified part of unspecified bronchus or lung: Secondary | ICD-10-CM | POA: Diagnosis not present

## 2014-12-16 NOTE — Progress Notes (Signed)
Megan [name] completed a Six-Minute Walk Test on 12/16/14 . Megan [name] walked 525 feet with 0 breaks.  The patient's lowest oxygen saturation was 92% , highest heart rate was 108 , and highest blood pressure was 107/59. The patient was on room air. Megan [name] stated that nothing hindered their walk test.

## 2014-12-18 ENCOUNTER — Encounter (HOSPITAL_COMMUNITY)
Admission: RE | Admit: 2014-12-18 | Discharge: 2014-12-18 | Disposition: A | Discharge status: Home / Self Care | Payer: Medicare Other | Source: Ambulatory Visit | Attending: Internal Medicine | Admitting: Internal Medicine

## 2014-12-18 ENCOUNTER — Ambulatory Visit (HOSPITAL_COMMUNITY): Payer: Medicare Other

## 2014-12-18 DIAGNOSIS — C3490 Malignant neoplasm of unspecified part of unspecified bronchus or lung: Secondary | ICD-10-CM | POA: Diagnosis not present

## 2014-12-18 LAB — GLUCOSE, CAPILLARY: Glucose-Capillary: 347 mg/dL — ABNORMAL HIGH (ref 70–99)

## 2014-12-18 NOTE — Progress Notes (Signed)
Patient arrived to pulmonary rehab today for her first day of exercise, she attended the Exercise for Pulmonary Patients class today.  We checked her CBG on the department glucometer and the result was 347, she was unable to get a urine sample for me to test for ketones.  She was very apprehensive to have her finger stuck to get a drop of blood, but did consent.  We discussed the importance of checking CBG before and after her exercise session.  Patient says she does not have a glucometer, but her care giver says that is untrue.  Patient says she does not check her CBG's at home.  Her blood pressure was 100/60, heart rate 89, spo2 98%.  She was not allowed to exercise with a CBG greater than 300.  Patient and care giver are calling Dr. Wilson Singer to discuss elevated CBG's and what to do.

## 2014-12-19 ENCOUNTER — Telehealth: Payer: Self-pay | Admitting: Internal Medicine

## 2014-12-19 ENCOUNTER — Ambulatory Visit (HOSPITAL_COMMUNITY)
Admission: RE | Admit: 2014-12-19 | Discharge: 2014-12-19 | Disposition: A | Discharge status: Home / Self Care | Payer: Medicare Other | Source: Ambulatory Visit | Attending: Oncology | Admitting: Oncology

## 2014-12-19 ENCOUNTER — Encounter (HOSPITAL_COMMUNITY): Payer: Self-pay

## 2014-12-19 ENCOUNTER — Other Ambulatory Visit (HOSPITAL_BASED_OUTPATIENT_CLINIC_OR_DEPARTMENT_OTHER): Payer: Medicare Other

## 2014-12-19 DIAGNOSIS — Z87891 Personal history of nicotine dependence: Secondary | ICD-10-CM | POA: Insufficient documentation

## 2014-12-19 DIAGNOSIS — Z923 Personal history of irradiation: Secondary | ICD-10-CM | POA: Diagnosis not present

## 2014-12-19 DIAGNOSIS — Z79899 Other long term (current) drug therapy: Secondary | ICD-10-CM | POA: Diagnosis not present

## 2014-12-19 DIAGNOSIS — C3491 Malignant neoplasm of unspecified part of right bronchus or lung: Secondary | ICD-10-CM

## 2014-12-19 DIAGNOSIS — E1165 Type 2 diabetes mellitus with hyperglycemia: Secondary | ICD-10-CM

## 2014-12-19 DIAGNOSIS — C342 Malignant neoplasm of middle lobe, bronchus or lung: Secondary | ICD-10-CM

## 2014-12-19 LAB — COMPREHENSIVE METABOLIC PANEL (CC13)
ALK PHOS: 102 U/L (ref 40–150)
ALT: 21 U/L (ref 0–55)
AST: 21 U/L (ref 5–34)
Albumin: 3.5 g/dL (ref 3.5–5.0)
Anion Gap: 14 mEq/L — ABNORMAL HIGH (ref 3–11)
BUN: 19.1 mg/dL (ref 7.0–26.0)
CHLORIDE: 102 meq/L (ref 98–109)
CO2: 27 mEq/L (ref 22–29)
Calcium: 9.7 mg/dL (ref 8.4–10.4)
Creatinine: 1.1 mg/dL (ref 0.6–1.1)
EGFR: 48 mL/min/{1.73_m2} — ABNORMAL LOW (ref >90)
Glucose: 88 mg/dl (ref 70–140)
Potassium: 3.1 mEq/L — ABNORMAL LOW (ref 3.5–5.1)
Sodium: 143 mEq/L (ref 136–145)
Total Bilirubin: 0.45 mg/dL (ref 0.20–1.20)
Total Protein: 6.5 g/dL (ref 6.4–8.3)

## 2014-12-19 LAB — CBC WITH DIFFERENTIAL/PLATELET
BASO%: 0.2 % (ref 0.0–2.0)
Basophils Absolute: 0 10*3/uL (ref 0.0–0.1)
EOS%: 0 % (ref 0.0–7.0)
Eosinophils Absolute: 0 10*3/uL (ref 0.0–0.5)
HCT: 38.4 % (ref 34.8–46.6)
HEMOGLOBIN: 12.9 g/dL (ref 11.6–15.9)
LYMPH#: 0.8 10*3/uL — AB (ref 0.9–3.3)
LYMPH%: 12.3 % — ABNORMAL LOW (ref 14.0–49.7)
MCH: 30.6 pg (ref 25.1–34.0)
MCHC: 33.6 g/dL (ref 31.5–36.0)
MCV: 91 fL (ref 79.5–101.0)
MONO#: 0.7 10*3/uL (ref 0.1–0.9)
MONO%: 10 % (ref 0.0–14.0)
NEUT#: 5.1 10*3/uL (ref 1.5–6.5)
NEUT%: 77.5 % — ABNORMAL HIGH (ref 38.4–76.8)
Platelets: 255 10*3/uL (ref 145–400)
RBC: 4.22 10*6/uL (ref 3.70–5.45)
RDW: 14.6 % — ABNORMAL HIGH (ref 11.2–14.5)
WBC: 6.5 10*3/uL (ref 3.9–10.3)

## 2014-12-19 LAB — TSH CHCC: TSH: 3.117 m[IU]/L (ref 0.308–3.960)

## 2014-12-19 MED ORDER — IOHEXOL 300 MG/ML  SOLN
80.0000 mL | Freq: Once | INTRAMUSCULAR | Status: AC | PRN
  Administered 2014-12-19: 80 mL via INTRAVENOUS

## 2014-12-19 NOTE — Telephone Encounter (Signed)
Called and spoke to Amy. Amy is to re-fax form to my attention. Unsure if this form was given to MR or not. Once form is received will have MR sign and will fax back.   Will forward to my box to follow.

## 2014-12-22 ENCOUNTER — Other Ambulatory Visit: Payer: Medicare Other

## 2014-12-22 ENCOUNTER — Ambulatory Visit (HOSPITAL_BASED_OUTPATIENT_CLINIC_OR_DEPARTMENT_OTHER): Payer: Medicare Other | Admitting: Oncology

## 2014-12-22 ENCOUNTER — Ambulatory Visit (HOSPITAL_BASED_OUTPATIENT_CLINIC_OR_DEPARTMENT_OTHER): Payer: Medicare Other

## 2014-12-22 ENCOUNTER — Telehealth: Payer: Self-pay | Admitting: Internal Medicine

## 2014-12-22 ENCOUNTER — Ambulatory Visit: Payer: Medicare Other | Admitting: Nutrition

## 2014-12-22 ENCOUNTER — Encounter: Payer: Self-pay | Admitting: Oncology

## 2014-12-22 VITALS — BP 110/57 | HR 71 | Temp 97.4°F | Resp 18 | Ht 66.0 in | Wt 129.7 lb

## 2014-12-22 DIAGNOSIS — C342 Malignant neoplasm of middle lobe, bronchus or lung: Secondary | ICD-10-CM

## 2014-12-22 DIAGNOSIS — Z5112 Encounter for antineoplastic immunotherapy: Secondary | ICD-10-CM

## 2014-12-22 DIAGNOSIS — G620 Drug-induced polyneuropathy: Secondary | ICD-10-CM

## 2014-12-22 MED ORDER — SODIUM CHLORIDE 0.9 % IV SOLN
Freq: Once | INTRAVENOUS | Status: AC
  Administered 2014-12-22: 10:00:00 via INTRAVENOUS

## 2014-12-22 MED ORDER — SODIUM CHLORIDE 0.9 % IV SOLN
3.0000 mg/kg | Freq: Once | INTRAVENOUS | Status: AC
  Administered 2014-12-22: 180 mg via INTRAVENOUS
  Filled 2014-12-22: qty 18

## 2014-12-22 NOTE — Patient Instructions (Signed)
[facility] Discharge Instructions for Patients Receiving Chemotherapy  Today you received the following chemotherapy agents: Nivolumab.  To help prevent nausea and vomiting after your treatment, we encourage you to take your nausea medication as prescribed.   If you develop nausea and vomiting that is not controlled by your nausea medication, call the clinic.   BELOW ARE SYMPTOMS THAT SHOULD BE REPORTED IMMEDIATELY:  *FEVER GREATER THAN 100.5 F  *CHILLS WITH OR WITHOUT FEVER  NAUSEA AND VOMITING THAT IS NOT CONTROLLED WITH YOUR NAUSEA MEDICATION  *UNUSUAL SHORTNESS OF BREATH  *UNUSUAL BRUISING OR BLEEDING  TENDERNESS IN MOUTH AND THROAT WITH OR WITHOUT PRESENCE OF ULCERS  *URINARY PROBLEMS  *BOWEL PROBLEMS  UNUSUAL RASH Items with * indicate a potential emergency and should be followed up as soon as possible.  Feel free to call the clinic you have any questions or concerns. The clinic phone number is [phone].

## 2014-12-22 NOTE — Telephone Encounter (Signed)
added appts....pt will get new sched in tx.

## 2014-12-22 NOTE — Progress Notes (Signed)
Nutrition follow-up completed with patient during chemotherapy. Patient has shown disease progression.  Noted hyperglycemia. Patient states she has a very good appetite.  She is status post esophageal dilatation. Diarrhea has improved.   Weight documented as 129.7 pounds February 22 decreased from 153 pounds July 27.   Nutrition diagnosis:  Unintended weight loss related to lung cancer and associated treatments as evidenced by 15% weight loss over 6 months.   Patient meets criteria for severe malnutrition in the context of chronic illness secondary to 15% weight loss over 6 months and severe depletion of body fat and muscle mass on physical exam.   Intervention:  Educated patient to consume higher calorie, higher protein foods snacks between meals.  Recommended patient  Consume oral nutrition supplements.  Provided basic diabetic diet education. Questions were answered.  Teach back method used.   Monitoring, evaluation, goals:  Patient will work to increase oral intake to minimize further weight loss.   Next visit:  Patient will contact me with questions or concerns.  I will follow as needed.  **Disclaimer: This note was dictated with voice recognition software. Similar sounding words can inadvertently be transcribed and this note may contain transcription errors which may not have been corrected upon publication of note.**

## 2014-12-22 NOTE — Progress Notes (Signed)
[facility] [phone]   [phone]  OFFICE PROGRESS NOTE  [name], MD [address] [place] [address]  DIAGNOSIS: Squamous cell lung cancer  Primary site: Lung (Right)  Staging method: AJCC 7th Edition  Clinical: Stage IIIB (T3, N3, M0)  Summary: Stage IIIB (T3, N3, M0)   PRIOR THERAPY:  1) Concurrent chemoradiation with weekly chemotherapy in the form of carboplatin for AUC of 2 and paclitaxel 45 mg/m2. Status post 7 week of treatment. Last dose was given 01/27/2014 no significant response in her disease.  2) Consolidation chemotherapy with carboplatin for AUC of 5 and paclitaxel 175 mg/M2 every 3 weeks with Neulasta support. First dose on 04/15/2014. Status post 3 cycles. Carboplatin was discontinued starting cycle #2 secondary to hypersensitivity reaction.  CURRENT THERAPY: Immunotherapy with Opdivo (Nivolumab) 3mg /kg given every 2 weeks. Status post 12 cycles.  CHEMOTHERAPY INTENT: Control/Palliative  CURRENT # OF CHEMOTHERAPY CYCLES: 13 CURRENT ANTIEMETICS: Zofran, dexamethasone, Compazine  CURRENT SMOKING STATUS: Former smoker, quit 12/05/1997  ORAL CHEMOTHERAPY AND CONSENT: n/a   CURRENT BISPHOSPHONATES USE: None  PAIN MANAGEMENT: no cancer related pain, patient does have RA  NARCOTICS INDUCED CONSTIPATION: none  LIVING WILL AND CODE STATUS: ?   INTERVAL HISTORY: Megan [name] 74 Megan.o. female returns to the clinic today for followup visit accompanied by her friend. The patient is doing fine today was no specific complaints except for the persistent peripheral neuropathy. Indicates that her medication for neuropathy was changed recently, but she does not know the name of this medication. She says that her neuropathy is better with the change in the medication. She also has uncontrolled blood sugar recently. PCP is following this and has her on Levemir. Her primary care physician, Dr. Wilson Singer is  monitoring/treating her elevated TSH. The patient denied having any significant chest pain but has shortness of breath with exertion with no cough or hemoptysis. She denied having any significant weight loss or night sweats. She has no nausea or vomiting. She had a recent upper endoscopy with dilatation. States that she still has the sensation of medications and water getting hung up in her throat. She is today to start cycle #13 of her treatment with Nivolumab.  MEDICAL HISTORY: Past Medical History  Diagnosis Date  . Hypertension   . Rheumatoid arthritis   . GERD (gastroesophageal reflux disease)     no meds for  . Hx of radiation therapy 12/16/13-01/30/14    lung 66Gy  . Neuropathy   . Shortness of breath dyspnea     with exertion  . History of hiatal hernia   . Diabetes mellitus     insulin  . Cancer dx'd 09/2013    Lung ca    ALLERGIES:  is allergic to remicade; carboplatin; and hydromorphone.  MEDICATIONS:  Current Outpatient Prescriptions  Medication Sig Dispense Refill  . B-D ULTRAFINE III SHORT PEN 31G X 8 MM MISC   0  . docusate sodium (COLACE) 100 MG capsule Take 1 capsule (100 mg total) by mouth every 12 (twelve) hours. 60 capsule 0  . dronabinol (MARINOL) 10 MG capsule Take 2 capsules (20 mg total) by mouth 2 (two) times daily before a meal. 120 capsule 5  . Fluticasone-Salmeterol (ADVAIR DISKUS) 100-50 MCG/DOSE AEPB Inhale 1 puff into the lungs 2 (two) times daily. 60 each 5  . Gabapentin, Once-Daily, (GRALISE) 600 MG TABS Take 1,800 mg by mouth daily. 90 tablet 6  . HUMALOG KWIKPEN 100  UNIT/ML KiwkPen   0  . ibuprofen (ADVIL,MOTRIN) 600 MG tablet Take 1 tablet (600 mg total) by mouth every 6 (six) hours as needed. Take with food. 30 tablet 0  . lamoTRIgine (LAMICTAL) 25 MG tablet Take 1 tablet daily x 14 days, then take 2 tablets daily x 14 days 60 tablet 6  . lansoprazole (PREVACID SOLUTAB) 30 MG disintegrating tablet Take 1 tablet (30 mg total) by mouth daily. 90  tablet 3  . LEVEMIR FLEXTOUCH 100 UNIT/ML Pen   0  . lidocaine (LIDODERM) 5 % PLACE 3 PATCHES ONTO THE SKIN EVERY DAY. LEAVE ON FOR 12 HOURS AND OFF FOR 12 HOURS. MAY CUT PATCH TO FIT 90 patch 1  . losartan-hydrochlorothiazide (HYZAAR) 100-25 MG per tablet Take 1 tablet by mouth daily.    . meloxicam (MOBIC) 15 MG tablet Take 15 mg by mouth daily as needed.     . pantoprazole (PROTONIX) 40 MG tablet Take 1 tablet (40 mg total) by mouth daily. (Patient taking differently: Take 40 mg by mouth daily as needed. ) 90 tablet 0  . potassium chloride SA (K-DUR,KLOR-CON) 20 MEQ tablet Take 40 mEq by mouth daily.    . prochlorperazine (COMPAZINE) 10 MG tablet Take 10 mg by mouth every 6 (six) hours as needed for nausea or vomiting.    [name] UNABLE TO FIND Med Name: Metanex- Vitamin B    . zolpidem (AMBIEN) 10 MG tablet Take 10 mg by mouth at bedtime as needed for sleep.    . metFORMIN (GLUCOPHAGE-XR) 500 MG 24 hr tablet Take 500 mg by mouth 4 (four) times daily.  5  . nystatin-triamcinolone (MYCOLOG II) cream Apply 1 application topically as directed.  0   No current facility-administered medications for this visit.   Facility-Administered Medications Ordered in Other Visits  Medication Dose Route Frequency Provider Last Rate Last Dose  . sodium chloride 0.9 % injection 10 mL  10 mL Intracatheter PRN Curt Bears, MD   10 mL at 12/30/13 1121    SURGICAL HISTORY:  Past Surgical History  Procedure Laterality Date  . Cholecystectomy    . Abdominal hysterectomy    . Dilation and curettage of uterus    . Abdominal surgery    . Tonsillectomy    . Esophagogastroduodenoscopy (egd) with propofol N/A 10/09/2014    Procedure: ESOPHAGOGASTRODUODENOSCOPY (EGD) WITH PROPOFOL;  Surgeon: Inda Castle, MD;  Location: [facility];  Service: Endoscopy;  Laterality: N/A;  . Savory dilation N/A 10/09/2014    Procedure: SAVORY DILATION;  Surgeon: Inda Castle, MD;  Location: [facility];  Service: Endoscopy;   Laterality: N/A;  . Esophagogastroduodenoscopy N/A 12/12/2014    Procedure: ESOPHAGOGASTRODUODENOSCOPY (EGD);  Surgeon: Inda Castle, MD;  Location: [facility];  Service: Endoscopy;  Laterality: N/A;  with dilation  . Balloon dilation N/A 12/12/2014    Procedure: BALLOON DILATION;  Surgeon: Inda Castle, MD;  Location: [facility];  Service: Endoscopy;  Laterality: N/A;    REVIEW OF SYSTEMS:  Constitutional: negative Eyes: negative Ears, nose, mouth, throat, and face: negative Respiratory: negative Cardiovascular: negative Gastrointestinal: negative Genitourinary:negative Integument/breast: negative Hematologic/lymphatic: negative Musculoskeletal:negative Neurological: positive for paresthesia Behavioral/Psych: negative Endocrine: negative Allergic/Immunologic: negative   PHYSICAL EXAMINATION: General appearance: alert, cooperative and no distress Head: Normocephalic, without obvious abnormality, atraumatic Neck: no adenopathy, no JVD, supple, symmetrical, trachea midline and thyroid not enlarged, symmetric, no tenderness/mass/nodules Lymph nodes: Cervical, supraclavicular, and axillary nodes normal. Resp: clear to auscultation bilaterally Back: symmetric, no curvature. ROM normal. No  CVA tenderness. Cardio: regular rate and rhythm, S1, S2 normal, no murmur, click, rub or gallop GI: soft, non-tender; bowel sounds normal; no masses,  no organomegaly Extremities: extremities normal, atraumatic, no cyanosis or edema Neurologic: Alert and oriented X 3, normal strength and tone. Normal symmetric reflexes. Normal coordination and gait  ECOG PERFORMANCE STATUS: 1 - Symptomatic but completely ambulatory  Blood pressure 110/57, pulse 71, temperature 97.4 F (36.3 C), temperature source Oral, resp. rate 18, height 5\' 6"  (1.676 m), weight 129 lb 11.2 oz (58.832 kg).  LABORATORY DATA: Lab Results  Component Value Date   WBC 6.5 12/19/2014   HGB 12.9 12/19/2014   HCT 38.4  12/19/2014   MCV 91.0 12/19/2014   PLT 255 12/19/2014      Chemistry      Component Value Date/Time   NA 143 12/19/2014 0936   NA 138 10/09/2014 1328   K 3.1* 12/19/2014 0936   K 3.7 10/09/2014 1328   CL 92* 09/08/2014 1417   CO2 27 12/19/2014 0936   CO2 28 09/08/2014 1417   BUN 19.1 12/19/2014 0936   BUN 15 09/08/2014 1417   CREATININE 1.1 12/19/2014 0936   CREATININE 0.67 09/08/2014 1417   GLU 400* 11/10/2014 1127      Component Value Date/Time   CALCIUM 9.7 12/19/2014 0936   CALCIUM 9.6 09/08/2014 1417   ALKPHOS 102 12/19/2014 0936   ALKPHOS 118* 09/08/2014 1417   AST 21 12/19/2014 0936   AST 17 09/08/2014 1417   ALT 21 12/19/2014 0936   ALT 25 09/08/2014 1417   BILITOT 0.45 12/19/2014 0936   BILITOT 0.4 09/08/2014 1417       RADIOGRAPHIC STUDIES: EXAM: CT CHEST WITH CONTRAST  TECHNIQUE: Multidetector CT imaging of the chest was performed during intravenous contrast administration.  CONTRAST: 49mL OMNIPAQUE IOHEXOL 300 MG/ML SOLN  COMPARISON: 10/22/2014  FINDINGS: Mediastinum/Nodes: The heart is normal in size. No pericardial effusion.  Mild coronary atherosclerosis in the LAD.  Atherosclerotic calcifications of the aortic arch.  8 mm short axis subcarinal node (series 2/ image 25), unchanged. No suspicious hilar or axillary lymphadenopathy.  Diffuse mid/lower esophageal wall thickening (series 2/ image 28), correlate for esophagitis.  Visualized thyroid is unremarkable.  Lungs/Pleura: Radiation changes with right upper lobe collapse.  Underlying 2.0 x 1.2 cm hypoenhancing lesion medially (series 2/image 19), previously 1.9 x 1.1 cm when measured in a similar fashion.  Additional radiation changes in the right lower lobe (series 5/image 21).  Calcified granuloma in the left lower lobe (series 5/ image 42). Visualized left lung is otherwise clear.  Trace right pleural effusion. No pneumothorax.  Upper abdomen:  Visualized upper abdomen is notable for a 1.2 x 1.7 cm cyst in the medial segment left hepatic lobe (series 2/image 55). Splenic granulomata. Cholecystectomy clips.  Musculoskeletal: Degenerative changes of the visualized thoracolumbar spine.  IMPRESSION: Stable radiation changes in the right upper lobe and right lower lobe.  Underlying 2.0 cm lesion medially in the right upper lobe is grossly unchanged and likely reflects post treatment changes. If there is concern for viable tumor, consider PET-CT.  8 mm short axis subcarinal node, unchanged.  Trace right pleural effusion.   Electronically Signed  By: Julian Hy M.D.  On: 12/19/2014 14:49  ASSESSMENT AND PLAN: This is a very pleasant 74 year old white female with history of stage IIIB non-small cell lung cancer status post a course of concurrent chemoradiation with weekly carboplatin and paclitaxel status post 7 cycles. The patient tolerated her  treatment fairly well with no significant adverse effects except for the radiation induced esophagitis.  She was also treated with 3 cycles of consolidation chemotherapy with carboplatin and paclitaxel but carboplatin was discontinued the start of cycle #2 secondary to hypersensitivity reaction. Her restaging scan showed evidence for disease progression and the patient is currently undergoing immunotherapy with Nivolumab 3 mg/KG every 2 weeks is status post 12 cycles.  Patient was discussed with and also seen by Dr. Julien Nordmann. CT scan results were reviewed and showed stable disease. She will proceed with cycle #13 today as scheduled.   She will follow-up  in 2 weeks for reevaluation before starting the next dose of her Nivolumab. She was advised to call immediately if she has any concerning symptoms in the interval. The patient voices understanding of current disease status and treatment options and is in agreement with the current care plan.  All questions were answered. The  patient knows to call the clinic with any problems, questions or concerns. We can certainly see the patient much sooner if necessary.  Mikey Bussing, DNP, AGPCNP-BC, AOCNP  12/22/2014    ADDENDUM: Hematology/Oncology Attending: I had a face to face encounter with the patient. I recommended her care plan. This is a very pleasant 74 years old white female with a stage IIIB non-small cell lung cancer, squamous cell carcinoma currently undergoing immunotherapy with Nivolumab status post 12 cycles. The patient is tolerating her treatment well with no significant adverse effects. Her recent CT scan of the chest showed no evidence for disease progression. I discussed the scan results with the patient today. I recommended for her to continue her current treatment with Nivolumab as a scheduled. For the peripheral neuropathy she will continue her current treatment as prescribed by neurology. She will come back for follow-up visit in 2 weeks with the next cycle of her treatment. The patient was advised to call immediately if she has any concerning symptoms in the interval.  Disclaimer: This note was dictated with voice recognition software. Similar sounding words can inadvertently be transcribed and may be missed upon review. Eilleen Kempf., MD 12/22/2014

## 2014-12-22 NOTE — Telephone Encounter (Signed)
Received fax. Fax given to MR to complete. Will fax once received.

## 2014-12-23 ENCOUNTER — Encounter (HOSPITAL_COMMUNITY)
Admission: RE | Admit: 2014-12-23 | Discharge: 2014-12-23 | Disposition: A | Discharge status: Home / Self Care | Payer: Medicare Other | Source: Ambulatory Visit | Attending: Internal Medicine | Admitting: Internal Medicine

## 2014-12-23 ENCOUNTER — Encounter (HOSPITAL_COMMUNITY): Payer: Medicare Other

## 2014-12-24 NOTE — Telephone Encounter (Signed)
Completed form faxed to [place]. Form placed in scan folder. Nothing further needed.

## 2014-12-25 ENCOUNTER — Encounter (HOSPITAL_COMMUNITY): Payer: Medicare Other

## 2014-12-25 NOTE — Progress Notes (Signed)
Unable to get a good blood sample to check CBG, patient refused to be stuck a third time.  Unable to exercise due to department policy of CBG before and after exercise.  Patient left without exercising on 12/23/2014.

## 2014-12-30 ENCOUNTER — Encounter (HOSPITAL_COMMUNITY): Payer: Medicare Other

## 2015-01-01 ENCOUNTER — Encounter (HOSPITAL_COMMUNITY): Admission: RE | Admit: 2015-01-01 | Payer: Medicare Other | Source: Ambulatory Visit

## 2015-01-05 ENCOUNTER — Encounter: Payer: Self-pay | Admitting: Physician Assistant

## 2015-01-05 ENCOUNTER — Telehealth: Payer: Self-pay | Admitting: Physician Assistant

## 2015-01-05 ENCOUNTER — Telehealth (HOSPITAL_COMMUNITY): Payer: Self-pay | Admitting: *Deleted

## 2015-01-05 ENCOUNTER — Telehealth: Payer: Self-pay | Admitting: *Deleted

## 2015-01-05 ENCOUNTER — Ambulatory Visit: Payer: Medicare Other

## 2015-01-05 ENCOUNTER — Other Ambulatory Visit (HOSPITAL_BASED_OUTPATIENT_CLINIC_OR_DEPARTMENT_OTHER): Payer: Medicare Other

## 2015-01-05 ENCOUNTER — Ambulatory Visit (HOSPITAL_BASED_OUTPATIENT_CLINIC_OR_DEPARTMENT_OTHER): Payer: Medicare Other | Admitting: Physician Assistant

## 2015-01-05 VITALS — BP 115/57 | HR 87 | Temp 97.1°F | Resp 18 | Ht 66.0 in | Wt 127.1 lb

## 2015-01-05 DIAGNOSIS — R197 Diarrhea, unspecified: Secondary | ICD-10-CM

## 2015-01-05 DIAGNOSIS — C3490 Malignant neoplasm of unspecified part of unspecified bronchus or lung: Secondary | ICD-10-CM

## 2015-01-05 DIAGNOSIS — C3491 Malignant neoplasm of unspecified part of right bronchus or lung: Secondary | ICD-10-CM

## 2015-01-05 DIAGNOSIS — C342 Malignant neoplasm of middle lobe, bronchus or lung: Secondary | ICD-10-CM

## 2015-01-05 DIAGNOSIS — C770 Secondary and unspecified malignant neoplasm of lymph nodes of head, face and neck: Secondary | ICD-10-CM

## 2015-01-05 LAB — COMPREHENSIVE METABOLIC PANEL (CC13)
ALK PHOS: 113 U/L (ref 40–150)
ALT: 23 U/L (ref 0–55)
ANION GAP: 11 meq/L (ref 3–11)
AST: 27 U/L (ref 5–34)
Albumin: 3.3 g/dL — ABNORMAL LOW (ref 3.5–5.0)
BUN: 14.7 mg/dL (ref 7.0–26.0)
CO2: 25 mEq/L (ref 22–29)
CREATININE: 0.8 mg/dL (ref 0.6–1.1)
Calcium: 9.3 mg/dL (ref 8.4–10.4)
Chloride: 107 mEq/L (ref 98–109)
EGFR: 78 mL/min/{1.73_m2} — ABNORMAL LOW (ref >90)
GLUCOSE: 102 mg/dL (ref 70–140)
POTASSIUM: 4 meq/L (ref 3.5–5.1)
Sodium: 143 mEq/L (ref 136–145)
Total Bilirubin: 0.37 mg/dL (ref 0.20–1.20)
Total Protein: 6.2 g/dL — ABNORMAL LOW (ref 6.4–8.3)

## 2015-01-05 LAB — CBC WITH DIFFERENTIAL/PLATELET
BASO%: 0.3 % (ref 0.0–2.0)
BASOS ABS: 0 10*3/uL (ref 0.0–0.1)
EOS ABS: 0 10*3/uL (ref 0.0–0.5)
EOS%: 0 % (ref 0.0–7.0)
HCT: 40.8 % (ref 34.8–46.6)
HGB: 13.2 g/dL (ref 11.6–15.9)
LYMPH%: 10.4 % — ABNORMAL LOW (ref 14.0–49.7)
MCH: 30.2 pg (ref 25.1–34.0)
MCHC: 32.3 g/dL (ref 31.5–36.0)
MCV: 93.4 fL (ref 79.5–101.0)
MONO#: 0.5 10*3/uL (ref 0.1–0.9)
MONO%: 7.8 % (ref 0.0–14.0)
NEUT#: 5.2 10*3/uL (ref 1.5–6.5)
NEUT%: 81.5 % — AB (ref 38.4–76.8)
PLATELETS: 171 10*3/uL (ref 145–400)
RBC: 4.37 10*6/uL (ref 3.70–5.45)
RDW: 14.7 % — ABNORMAL HIGH (ref 11.2–14.5)
WBC: 6.3 10*3/uL (ref 3.9–10.3)
lymph#: 0.7 10*3/uL — ABNORMAL LOW (ref 0.9–3.3)

## 2015-01-05 LAB — TECHNOLOGIST REVIEW

## 2015-01-05 LAB — TSH CHCC: TSH: 5.113 m(IU)/L — ABNORMAL HIGH (ref 0.308–3.960)

## 2015-01-05 NOTE — Progress Notes (Addendum)
[place] [phone]   [phone]  OFFICE PROGRESS NOTE  [name], MD [address] [place] [address] [place] [address]  DIAGNOSIS: Squamous cell lung cancer  Primary site: Lung (Right)  Staging method: AJCC 7th Edition  Clinical: Stage IIIB (T3, N3, M0)  Summary: Stage IIIB (T3, N3, M0)   PRIOR THERAPY:  1) Concurrent chemoradiation with weekly chemotherapy in the form of carboplatin for AUC of 2 and paclitaxel 45 mg/m2. Status post 7 week of treatment. Last dose was given 01/27/2014 no significant response in her disease.  2) Consolidation chemotherapy with carboplatin for AUC of 5 and paclitaxel 175 mg/M2 every 3 weeks with Neulasta support. First dose on 04/15/2014. Status post 3 cycles. Carboplatin was discontinued starting cycle #2 secondary to hypersensitivity reaction.  CURRENT THERAPY: Immunotherapy with Opdivo (Nivolumab) 3mg /kg given every 2 weeks. Status post 13 cycles.  CHEMOTHERAPY INTENT: Control/Palliative  CURRENT # OF CHEMOTHERAPY CYCLES: 13 CURRENT ANTIEMETICS: Zofran, dexamethasone, Compazine  CURRENT SMOKING STATUS: Former smoker, quit 12/05/1997  ORAL CHEMOTHERAPY AND CONSENT: n/a   CURRENT BISPHOSPHONATES USE: None  PAIN MANAGEMENT: no cancer related pain, patient does have RA  NARCOTICS INDUCED CONSTIPATION: none  LIVING WILL AND CODE STATUS: ?   INTERVAL HISTORY: Megan [name] 74 y.o. female returns to the clinic today for followup visit accompanied by her friend. The patient complains of diarrhea that she states has been present for the past 4 weeks. She is averaging 3-6 episodes per day and states that it is not bloody. She had significant increase in the number of episodes yesterday she states "about 20 episodes". She only took 2 Imodium tablets yesterday. She denies any bloody content. She's had a recent increase in her Lamictal but no other medication changes. She voiced no specific complaints  except for the persistent peripheral neuropathy.  She says that her neuropathy is better with the change in the medication. She also has uncontrolled blood sugar recently. PCP is following this and has her on Levemir. Her primary care physician, Dr. Wilson Singer is monitoring/treating her elevated TSH and blood sugars. The patient denied having any significant chest pain but has shortness of breath with exertion with no cough or hemoptysis. She denied having any significant weight loss or night sweats. She has no nausea or vomiting. She had a recent upper endoscopy with dilatation, approximately 1 month ago. States that she still has the sensation of medications and water getting hung up in her throat. She is today to start cycle #14 of her treatment with Nivolumab.  MEDICAL HISTORY: Past Medical History  Diagnosis Date  . Hypertension   . Rheumatoid arthritis   . GERD (gastroesophageal reflux disease)     no meds for  . Hx of radiation therapy 12/16/13-01/30/14    lung 66Gy  . Neuropathy   . Shortness of breath dyspnea     with exertion  . History of hiatal hernia   . Diabetes mellitus     insulin  . Cancer dx'd 09/2013    Lung ca    ALLERGIES:  is allergic to remicade; carboplatin; and hydromorphone.  MEDICATIONS:  Current Outpatient Prescriptions  Medication Sig Dispense Refill  . B-D ULTRAFINE III SHORT PEN 31G X 8 MM MISC   0  . docusate sodium (COLACE) 100 MG capsule Take 1 capsule (100 mg total) by mouth every 12 (twelve) hours. 60 capsule 0  . dronabinol (MARINOL) 10 MG capsule Take 2 capsules (20 mg total) by  mouth 2 (two) times daily before a meal. 120 capsule 5  . Fluticasone-Salmeterol (ADVAIR DISKUS) 100-50 MCG/DOSE AEPB Inhale 1 puff into the lungs 2 (two) times daily. 60 each 5  . Gabapentin, Once-Daily, (GRALISE) 600 MG TABS Take 1,800 mg by mouth daily. 90 tablet 6  . HUMALOG KWIKPEN 100 UNIT/ML KiwkPen   0  . ibuprofen (ADVIL,MOTRIN) 600 MG tablet Take 1 tablet (600 mg total)  by mouth every 6 (six) hours as needed. Take with food. 30 tablet 0  . lamoTRIgine (LAMICTAL) 25 MG tablet Take 1 tablet daily x 14 days, then take 2 tablets daily x 14 days 60 tablet 6  . lansoprazole (PREVACID SOLUTAB) 30 MG disintegrating tablet Take 1 tablet (30 mg total) by mouth daily. 90 tablet 3  . LEVEMIR FLEXTOUCH 100 UNIT/ML Pen   0  . lidocaine (LIDODERM) 5 % PLACE 3 PATCHES ONTO THE SKIN EVERY DAY. LEAVE ON FOR 12 HOURS AND OFF FOR 12 HOURS. MAY CUT PATCH TO FIT 90 patch 1  . losartan-hydrochlorothiazide (HYZAAR) 100-25 MG per tablet Take 1 tablet by mouth daily.    . meloxicam (MOBIC) 15 MG tablet Take 15 mg by mouth daily as needed.     . nystatin-triamcinolone (MYCOLOG II) cream Apply 1 application topically as directed.  0  . pantoprazole (PROTONIX) 40 MG tablet Take 1 tablet (40 mg total) by mouth daily. (Patient taking differently: Take 40 mg by mouth daily as needed. ) 90 tablet 0  . potassium chloride SA (K-DUR,KLOR-CON) 20 MEQ tablet Take 40 mEq by mouth daily.    . prochlorperazine (COMPAZINE) 10 MG tablet Take 10 mg by mouth every 6 (six) hours as needed for nausea or vomiting.    [name] zolpidem (AMBIEN) 10 MG tablet Take 10 mg by mouth at bedtime as needed for sleep.    . metFORMIN (GLUCOPHAGE-XR) 500 MG 24 hr tablet Take 500 mg by mouth 4 (four) times daily.  5  . UNABLE TO FIND Med Name: Metanex- Vitamin B     No current facility-administered medications for this visit.   Facility-Administered Medications Ordered in Other Visits  Medication Dose Route Frequency Provider Last Rate Last Dose  . sodium chloride 0.9 % injection 10 mL  10 mL Intracatheter PRN Curt Bears, MD   10 mL at 12/30/13 1121    SURGICAL HISTORY:  Past Surgical History  Procedure Laterality Date  . Cholecystectomy    . Abdominal hysterectomy    . Dilation and curettage of uterus    . Abdominal surgery    . Tonsillectomy    . Esophagogastroduodenoscopy (egd) with propofol N/A 10/09/2014     Procedure: ESOPHAGOGASTRODUODENOSCOPY (EGD) WITH PROPOFOL;  Surgeon: Inda Castle, MD;  Location: [facility];  Service: Endoscopy;  Laterality: N/A;  . Savory dilation N/A 10/09/2014    Procedure: SAVORY DILATION;  Surgeon: Inda Castle, MD;  Location: [facility];  Service: Endoscopy;  Laterality: N/A;  . Esophagogastroduodenoscopy N/A 12/12/2014    Procedure: ESOPHAGOGASTRODUODENOSCOPY (EGD);  Surgeon: Inda Castle, MD;  Location: [facility];  Service: Endoscopy;  Laterality: N/A;  with dilation  . Balloon dilation N/A 12/12/2014    Procedure: BALLOON DILATION;  Surgeon: Inda Castle, MD;  Location: [facility];  Service: Endoscopy;  Laterality: N/A;    REVIEW OF SYSTEMS:  Constitutional: negative Eyes: negative Ears, nose, mouth, throat, and face: negative Respiratory: negative Cardiovascular: negative Gastrointestinal: positive for diarrhea Genitourinary:negative Integument/breast: negative Hematologic/lymphatic: negative Musculoskeletal:negative Neurological: positive for paresthesia Behavioral/Psych: negative Endocrine:  negative Allergic/Immunologic: negative   PHYSICAL EXAMINATION: General appearance: alert, cooperative and no distress Head: Normocephalic, without obvious abnormality, atraumatic Neck: no adenopathy, no JVD, supple, symmetrical, trachea midline and thyroid not enlarged, symmetric, no tenderness/mass/nodules Lymph nodes: Cervical, supraclavicular, and axillary nodes normal. Resp: clear to auscultation bilaterally Back: symmetric, no curvature. ROM normal. No CVA tenderness. Cardio: regular rate and rhythm, S1, S2 normal, no murmur, click, rub or gallop GI: soft, non-tender; bowel sounds normal; no masses,  no organomegaly Extremities: extremities normal, atraumatic, no cyanosis or edema Neurologic: Alert and oriented X 3, normal strength and tone. Normal symmetric reflexes. Normal coordination and gait  ECOG PERFORMANCE STATUS: 1 - Symptomatic  but completely ambulatory  Blood pressure 115/57, pulse 87, temperature 97.1 F (36.2 C), temperature source Oral, resp. rate 18, height 5\' 6"  (1.676 m), weight 127 lb 1.6 oz (57.652 kg).  LABORATORY DATA: Lab Results  Component Value Date   WBC 6.3 01/05/2015   HGB 13.2 01/05/2015   HCT 40.8 01/05/2015   MCV 93.4 01/05/2015   PLT 171 01/05/2015      Chemistry      Component Value Date/Time   NA 143 01/05/2015 0921   NA 138 10/09/2014 1328   K 4.0 01/05/2015 0921   K 3.7 10/09/2014 1328   CL 92* 09/08/2014 1417   CO2 25 01/05/2015 0921   CO2 28 09/08/2014 1417   BUN 14.7 01/05/2015 0921   BUN 15 09/08/2014 1417   CREATININE 0.8 01/05/2015 0921   CREATININE 0.67 09/08/2014 1417   GLU 400* 11/10/2014 1127      Component Value Date/Time   CALCIUM 9.3 01/05/2015 0921   CALCIUM 9.6 09/08/2014 1417   ALKPHOS 113 01/05/2015 0921   ALKPHOS 118* 09/08/2014 1417   AST 27 01/05/2015 0921   AST 17 09/08/2014 1417   ALT 23 01/05/2015 0921   ALT 25 09/08/2014 1417   BILITOT 0.37 01/05/2015 0921   BILITOT 0.4 09/08/2014 1417       RADIOGRAPHIC STUDIES: EXAM: CT CHEST WITH CONTRAST  TECHNIQUE: Multidetector CT imaging of the chest was performed during intravenous contrast administration.  CONTRAST: 7mL OMNIPAQUE IOHEXOL 300 MG/ML SOLN  COMPARISON: 10/22/2014  FINDINGS: Mediastinum/Nodes: The heart is normal in size. No pericardial effusion.  Mild coronary atherosclerosis in the LAD.  Atherosclerotic calcifications of the aortic arch.  8 mm short axis subcarinal node (series 2/ image 25), unchanged. No suspicious hilar or axillary lymphadenopathy.  Diffuse mid/lower esophageal wall thickening (series 2/ image 28), correlate for esophagitis.  Visualized thyroid is unremarkable.  Lungs/Pleura: Radiation changes with right upper lobe collapse.  Underlying 2.0 x 1.2 cm hypoenhancing lesion medially (series 2/image 19), previously 1.9 x 1.1 cm  when measured in a similar fashion.  Additional radiation changes in the right lower lobe (series 5/image 21).  Calcified granuloma in the left lower lobe (series 5/ image 42). Visualized left lung is otherwise clear.  Trace right pleural effusion. No pneumothorax.  Upper abdomen: Visualized upper abdomen is notable for a 1.2 x 1.7 cm cyst in the medial segment left hepatic lobe (series 2/image 55). Splenic granulomata. Cholecystectomy clips.  Musculoskeletal: Degenerative changes of the visualized thoracolumbar spine.  IMPRESSION: Stable radiation changes in the right upper lobe and right lower lobe.  Underlying 2.0 cm lesion medially in the right upper lobe is grossly unchanged and likely reflects post treatment changes. If there is concern for viable tumor, consider PET-CT.  8 mm short axis subcarinal node, unchanged.  Trace right pleural effusion.  Electronically Signed  By: Julian Hy M.D.  On: 12/19/2014 14:49  ASSESSMENT AND PLAN: This is a very pleasant 74 year old white female with history of stage IIIB non-small cell lung cancer status post a course of concurrent chemoradiation with weekly carboplatin and paclitaxel status post 7 cycles. The patient tolerated her treatment fairly well with no significant adverse effects except for the radiation induced esophagitis.  She was also treated with 3 cycles of consolidation chemotherapy with carboplatin and paclitaxel but carboplatin was discontinued the start of cycle #2 secondary to hypersensitivity reaction. Her restaging scan showed evidence for disease progression and the patient is currently undergoing immunotherapy with Nivolumab 3 mg/KG every 2 weeks is status post 12 cycles.  Patient was discussed with and also seen by Dr. Julien Nordmann. Her most recent CT scan results showed stable disease. Her diarrhea is concerning and we will hold her treatment with immunotherapy today and have her obtain a stool  sample for C. difficile. We'll reschedule cycle #14 to begin in one week as long as the diarrhea is controlled or has resolved.   She will follow-up  in 3 weeks for reevaluation before starting the next dose of her Nivolumab. She was advised to call immediately if she has any concerning symptoms in the interval. The patient voices understanding of current disease status and treatment options and is in agreement with the current care plan.  All questions were answered. The patient knows to call the clinic with any problems, questions or concerns. We can certainly see the patient much sooner if necessary.  Carlton Adam, PA-C  01/05/2015    ADDENDUM: Hematology/Oncology Attending: I had a face to face encounter with the patient. I recommended her care plan. This is a very pleasant 74 years old white female with stage IIIB non-small cell lung cancer status post course of concurrent chemoradiation followed by consolidation chemotherapy and she is currently undergoing immunotherapy with Nivolumab status post 13 cycles. Her last CT scan showed evidence for stable disease. The patient had several episodes of diarrhea recently to find etiology but could be related to her immunotherapy. We will check her stool for C. Difficile. I will also reschedule her treatment with immunotherapy to start next week once she has improvement in her diarrhea. The patient would come back for follow-up visit in 3 weeks with the following cycle of her immunotherapy. She was advised to call immediately if she has any concerning symptoms in the interval or no improvement in her diarrhea.  Disclaimer: This note was dictated with voice recognition software. Similar sounding words can inadvertently be transcribed and may be missed upon review. Eilleen Kempf., MD 01/05/2015

## 2015-01-05 NOTE — Telephone Encounter (Signed)
Per staff message and POF I have scheduled appts. Advised scheduler of appts. JMW  

## 2015-01-05 NOTE — Patient Instructions (Signed)
Provide a stool sample to be tested for C. Difficile Take Imodium as instructed Return in one week for reevaluation prior to resuming your immunotherapy Follow-up in 3 weeks

## 2015-01-05 NOTE — Telephone Encounter (Signed)
Pt confirmed labs/ov per 03/07 POF, gave pt AVS... KJ, sent msg to add chemo

## 2015-01-06 ENCOUNTER — Other Ambulatory Visit (HOSPITAL_COMMUNITY)
Admission: RE | Admit: 2015-01-06 | Discharge: 2015-01-06 | Disposition: A | Discharge status: Home / Self Care | Payer: Medicare Other | Source: Ambulatory Visit | Attending: Internal Medicine | Admitting: Internal Medicine

## 2015-01-06 ENCOUNTER — Telehealth: Payer: Self-pay | Admitting: Neurology

## 2015-01-06 ENCOUNTER — Encounter (HOSPITAL_COMMUNITY): Payer: Medicare Other

## 2015-01-06 ENCOUNTER — Ambulatory Visit: Payer: Medicare Other

## 2015-01-06 NOTE — Telephone Encounter (Signed)
Pt is calling stating she has questions regarding lamoTRIgine (LAMICTAL) 25 MG tablet on the directions of how to take it.  Please call and advise.

## 2015-01-07 ENCOUNTER — Telehealth: Payer: Self-pay | Admitting: *Deleted

## 2015-01-07 ENCOUNTER — Telehealth: Payer: Self-pay | Admitting: Neurology

## 2015-01-07 ENCOUNTER — Other Ambulatory Visit: Payer: Self-pay | Admitting: Neurology

## 2015-01-07 DIAGNOSIS — G609 Hereditary and idiopathic neuropathy, unspecified: Secondary | ICD-10-CM

## 2015-01-07 MED ORDER — LAMOTRIGINE ER 25 MG PO TB24: 25.0000 mg | ORAL_TABLET | Freq: Every day | ORAL | Status: DC

## 2015-01-07 NOTE — Telephone Encounter (Signed)
Patient is having diarrhea with lamictal 50mg  daily. Will change to a different formulation lamictal 25mg  XR daily and see if that helps. If diarrhea continues, stop. If tolerated, will increase 25mg  daily q2weeks until at 100mg  daily

## 2015-01-07 NOTE — Telephone Encounter (Signed)
[facility] lab cancelled pt Cdiff order b/c stool is too formed. Reviewed with MD,Called pt instructed to call next time having loose stools/diarrhea and we can order Cdiff test at that time. Pt verbalized understanding. No concerns at this time.

## 2015-01-08 ENCOUNTER — Encounter (HOSPITAL_COMMUNITY): Payer: Medicare Other

## 2015-01-12 ENCOUNTER — Ambulatory Visit (HOSPITAL_BASED_OUTPATIENT_CLINIC_OR_DEPARTMENT_OTHER): Payer: Medicare Other

## 2015-01-12 ENCOUNTER — Other Ambulatory Visit (HOSPITAL_BASED_OUTPATIENT_CLINIC_OR_DEPARTMENT_OTHER): Payer: Medicare Other

## 2015-01-12 DIAGNOSIS — C3491 Malignant neoplasm of unspecified part of right bronchus or lung: Secondary | ICD-10-CM

## 2015-01-12 DIAGNOSIS — C342 Malignant neoplasm of middle lobe, bronchus or lung: Secondary | ICD-10-CM

## 2015-01-12 DIAGNOSIS — Z5112 Encounter for antineoplastic immunotherapy: Secondary | ICD-10-CM

## 2015-01-12 LAB — CBC WITH DIFFERENTIAL/PLATELET
BASO%: 0.2 % (ref 0.0–2.0)
Basophils Absolute: 0 10*3/uL (ref 0.0–0.1)
EOS ABS: 0 10*3/uL (ref 0.0–0.5)
EOS%: 0.2 % (ref 0.0–7.0)
HCT: 38.2 % (ref 34.8–46.6)
HGB: 12.7 g/dL (ref 11.6–15.9)
LYMPH%: 10.8 % — ABNORMAL LOW (ref 14.0–49.7)
MCH: 30.9 pg (ref 25.1–34.0)
MCHC: 33.2 g/dL (ref 31.5–36.0)
MCV: 92.9 fL (ref 79.5–101.0)
MONO#: 0.5 10*3/uL (ref 0.1–0.9)
MONO%: 7 % (ref 0.0–14.0)
NEUT%: 81.8 % — ABNORMAL HIGH (ref 38.4–76.8)
NEUTROS ABS: 5.2 10*3/uL (ref 1.5–6.5)
PLATELETS: 156 10*3/uL (ref 145–400)
RBC: 4.11 10*6/uL (ref 3.70–5.45)
RDW: 14.4 % (ref 11.2–14.5)
WBC: 6.4 10*3/uL (ref 3.9–10.3)
lymph#: 0.7 10*3/uL — ABNORMAL LOW (ref 0.9–3.3)

## 2015-01-12 LAB — COMPREHENSIVE METABOLIC PANEL (CC13)
ALBUMIN: 3.3 g/dL — AB (ref 3.5–5.0)
ALK PHOS: 106 U/L (ref 40–150)
ALT: 24 U/L (ref 0–55)
AST: 19 U/L (ref 5–34)
Anion Gap: 12 mEq/L — ABNORMAL HIGH (ref 3–11)
BILIRUBIN TOTAL: 0.59 mg/dL (ref 0.20–1.20)
BUN: 13.1 mg/dL (ref 7.0–26.0)
CO2: 26 mEq/L (ref 22–29)
Calcium: 9 mg/dL (ref 8.4–10.4)
Chloride: 102 mEq/L (ref 98–109)
Creatinine: 0.8 mg/dL (ref 0.6–1.1)
EGFR: 79 mL/min/{1.73_m2} — ABNORMAL LOW (ref >90)
GLUCOSE: 249 mg/dL — AB (ref 70–140)
POTASSIUM: 3.7 meq/L (ref 3.5–5.1)
Sodium: 140 mEq/L (ref 136–145)
Total Protein: 5.9 g/dL — ABNORMAL LOW (ref 6.4–8.3)

## 2015-01-12 MED ORDER — SODIUM CHLORIDE 0.9 % IV SOLN
3.0000 mg/kg | Freq: Once | INTRAVENOUS | Status: AC
  Administered 2015-01-12: 180 mg via INTRAVENOUS
  Filled 2015-01-12: qty 18

## 2015-01-12 MED ORDER — SODIUM CHLORIDE 0.9 % IV SOLN
Freq: Once | INTRAVENOUS | Status: AC
  Administered 2015-01-12: 15:00:00 via INTRAVENOUS

## 2015-01-13 ENCOUNTER — Encounter (HOSPITAL_COMMUNITY): Admission: RE | Admit: 2015-01-13 | Payer: Medicare Other | Source: Ambulatory Visit

## 2015-01-13 ENCOUNTER — Other Ambulatory Visit: Payer: Self-pay | Admitting: Neurology

## 2015-01-15 ENCOUNTER — Encounter (HOSPITAL_COMMUNITY): Payer: Medicare Other

## 2015-01-19 ENCOUNTER — Other Ambulatory Visit: Payer: Medicare Other

## 2015-01-19 ENCOUNTER — Ambulatory Visit: Payer: Medicare Other

## 2015-01-19 NOTE — Addendum Note (Signed)
Encounter addended by: Lance Morin, RN on: 01/19/2015  9:34 AM<BR>     Documentation filed: Clinical Notes, Notes Section

## 2015-01-19 NOTE — Progress Notes (Signed)
Discharge Note from Pulmonary Rehab  Megan [name] has been discharged from the pulmonary rehab program due to attendance and CBG sampling.  On patient's first day of exercise her CBG was 347 and was not allowed to exercise due to ACSM guidelines.  Instructed to take her sliding scale insulin when she arrives home.  Unable to get a urine sample from patient to check for ketones.  Patient dose not check her CBG's at home.  Initially patient said she had a glucose meter at home, and then later she changed her response and said she did not have one.  Her caregiver confirmed she has one, but does not use it because it is painful for her to stick her finger for a blood sample.  She has cold hands and I was able to get a good blood sample once after running warm water over patient's hands.  Another encounter was not as easy and it required patient being stuck a third time for a CBG, patient refused and did not return to the program.  I left 2 telephone messages for patient and she did not return a call.

## 2015-01-20 ENCOUNTER — Encounter (HOSPITAL_COMMUNITY): Payer: Medicare Other

## 2015-01-22 ENCOUNTER — Encounter (HOSPITAL_COMMUNITY): Payer: Medicare Other

## 2015-01-26 ENCOUNTER — Other Ambulatory Visit (HOSPITAL_BASED_OUTPATIENT_CLINIC_OR_DEPARTMENT_OTHER): Payer: Medicare Other

## 2015-01-26 ENCOUNTER — Ambulatory Visit (HOSPITAL_BASED_OUTPATIENT_CLINIC_OR_DEPARTMENT_OTHER): Payer: Medicare Other | Admitting: Physician Assistant

## 2015-01-26 ENCOUNTER — Telehealth: Payer: Self-pay

## 2015-01-26 ENCOUNTER — Encounter: Payer: Self-pay | Admitting: Physician Assistant

## 2015-01-26 ENCOUNTER — Telehealth: Payer: Self-pay | Admitting: *Deleted

## 2015-01-26 ENCOUNTER — Ambulatory Visit (HOSPITAL_BASED_OUTPATIENT_CLINIC_OR_DEPARTMENT_OTHER): Payer: Medicare Other

## 2015-01-26 ENCOUNTER — Telehealth: Payer: Self-pay | Admitting: Internal Medicine

## 2015-01-26 VITALS — BP 128/58 | HR 95 | Temp 97.4°F | Resp 18 | Ht 66.0 in | Wt 125.1 lb

## 2015-01-26 DIAGNOSIS — R739 Hyperglycemia, unspecified: Secondary | ICD-10-CM

## 2015-01-26 DIAGNOSIS — C3490 Malignant neoplasm of unspecified part of unspecified bronchus or lung: Secondary | ICD-10-CM

## 2015-01-26 DIAGNOSIS — C342 Malignant neoplasm of middle lobe, bronchus or lung: Secondary | ICD-10-CM | POA: Diagnosis not present

## 2015-01-26 DIAGNOSIS — E1165 Type 2 diabetes mellitus with hyperglycemia: Secondary | ICD-10-CM | POA: Diagnosis not present

## 2015-01-26 DIAGNOSIS — G622 Polyneuropathy due to other toxic agents: Secondary | ICD-10-CM | POA: Diagnosis not present

## 2015-01-26 DIAGNOSIS — C3491 Malignant neoplasm of unspecified part of right bronchus or lung: Secondary | ICD-10-CM

## 2015-01-26 DIAGNOSIS — R197 Diarrhea, unspecified: Secondary | ICD-10-CM | POA: Diagnosis not present

## 2015-01-26 LAB — CBC WITH DIFFERENTIAL/PLATELET
BASO%: 0.4 % (ref 0.0–2.0)
Basophils Absolute: 0 10*3/uL (ref 0.0–0.1)
EOS%: 1.9 % (ref 0.0–7.0)
Eosinophils Absolute: 0.1 10*3/uL (ref 0.0–0.5)
HCT: 42.3 % (ref 34.8–46.6)
HGB: 14 g/dL (ref 11.6–15.9)
LYMPH#: 0.7 10*3/uL — AB (ref 0.9–3.3)
LYMPH%: 9.6 % — ABNORMAL LOW (ref 14.0–49.7)
MCH: 30.8 pg (ref 25.1–34.0)
MCHC: 33.1 g/dL (ref 31.5–36.0)
MCV: 93 fL (ref 79.5–101.0)
MONO#: 0.5 10*3/uL (ref 0.1–0.9)
MONO%: 6.5 % (ref 0.0–14.0)
NEUT#: 6 10*3/uL (ref 1.5–6.5)
NEUT%: 81.6 % — ABNORMAL HIGH (ref 38.4–76.8)
Platelets: 162 10*3/uL (ref 145–400)
RBC: 4.55 10*6/uL (ref 3.70–5.45)
RDW: 14 % (ref 11.2–14.5)
WBC: 7.4 10*3/uL (ref 3.9–10.3)

## 2015-01-26 LAB — COMPREHENSIVE METABOLIC PANEL (CC13)
ALT: 24 U/L (ref 0–55)
AST: 21 U/L (ref 5–34)
Albumin: 3.6 g/dL (ref 3.5–5.0)
Alkaline Phosphatase: 150 U/L (ref 40–150)
Anion Gap: 17 mEq/L — ABNORMAL HIGH (ref 3–11)
BUN: 15.1 mg/dL (ref 7.0–26.0)
CALCIUM: 8.9 mg/dL (ref 8.4–10.4)
CO2: 19 mEq/L — ABNORMAL LOW (ref 22–29)
Chloride: 98 mEq/L (ref 98–109)
Creatinine: 1 mg/dL (ref 0.6–1.1)
EGFR: 54 mL/min/{1.73_m2} — ABNORMAL LOW (ref >90)
GLUCOSE: 628 mg/dL — AB (ref 70–140)
POTASSIUM: 4.4 meq/L (ref 3.5–5.1)
SODIUM: 134 meq/L — AB (ref 136–145)
TOTAL PROTEIN: 6.5 g/dL (ref 6.4–8.3)
Total Bilirubin: 1.11 mg/dL (ref 0.20–1.20)

## 2015-01-26 LAB — WHOLE BLOOD GLUCOSE: HRS PC: 4 h

## 2015-01-26 LAB — TSH CHCC: TSH: 3.689 m[IU]/L (ref 0.308–3.960)

## 2015-01-26 MED ORDER — INSULIN REGULAR HUMAN 100 UNIT/ML IJ SOLN
20.0000 [IU] | Freq: Once | INTRAMUSCULAR | Status: AC
  Administered 2015-01-26: 20 [IU] via SUBCUTANEOUS
  Filled 2015-01-26: qty 0.2

## 2015-01-26 NOTE — Telephone Encounter (Signed)
Called Dr. Eugenio Hoes office for an appt today or tomorrow d/t high BS. Pt blood glucose 628. Informed nurse pt was given insulin 20 units of regular and we will recheck BS. Appt made with Dr.Kohut at 1215 today. Informed pt of information, pt verbalized understanding.

## 2015-01-26 NOTE — Telephone Encounter (Signed)
Pt confirmed labs/ov per 03/28 POF, gave pt AVS and calendar.... KJ, sent msg to add chemo

## 2015-01-26 NOTE — Progress Notes (Signed)
Notified phlebotomist, Maudie Mercury, Insulin given per MD order and will need to recheck at 1120. Kim verbalized understanding.  1120 Blood sugar rechecked per FS by lab. [place], no numerical value. Instructed pt that she has a 1215 appointment with Dr.Kubic for elevated blood sugars. Pt verbalized understanding the importance of keeping this appointment. Please see lab values.

## 2015-01-26 NOTE — Telephone Encounter (Signed)
Per staff message and POF I have scheduled appts. Advised scheduler of appts. JMW  

## 2015-01-26 NOTE — Progress Notes (Addendum)
[place] [phone]   [phone]  OFFICE PROGRESS NOTE  [name], MD [address] [place] [address]  DIAGNOSIS: Squamous cell lung cancer  Primary site: Lung (Right)  Staging method: AJCC 7th Edition  Clinical: Stage IIIB (T3, N3, M0)  Summary: Stage IIIB (T3, N3, M0)   PRIOR THERAPY:  1) Concurrent chemoradiation with weekly chemotherapy in the form of carboplatin for AUC of 2 and paclitaxel 45 mg/m2. Status post 7 week of treatment. Last dose was given 01/27/2014 no significant response in her disease.  2) Consolidation chemotherapy with carboplatin for AUC of 5 and paclitaxel 175 mg/M2 every 3 weeks with Neulasta support. First dose on 04/15/2014. Status post 3 cycles. Carboplatin was discontinued starting cycle #2 secondary to hypersensitivity reaction.  CURRENT THERAPY: Immunotherapy with Opdivo (Nivolumab) 3mg /kg given every 2 weeks. Status post 14 cycles.  CHEMOTHERAPY INTENT: Control/Palliative  CURRENT # OF CHEMOTHERAPY CYCLES: 14 CURRENT ANTIEMETICS: Zofran, dexamethasone, Compazine  CURRENT SMOKING STATUS: Former smoker, quit 12/05/1997  ORAL CHEMOTHERAPY AND CONSENT: n/a   CURRENT BISPHOSPHONATES USE: None  PAIN MANAGEMENT: no cancer related pain, patient does have RA  NARCOTICS INDUCED CONSTIPATION: none  LIVING WILL AND CODE STATUS: ?   INTERVAL HISTORY: Megan [name] 74 y.o. female returns to the clinic today for followup visit accompanied by her friend. The patient complains of diarrhea that she states has been present for the past 6 weeks. She is averaging 3-6 episodes per day and states that it is not bloody. After a report of significantly increased frequency of episodes, we ordered her stool be tested for C. Difficile. Her specimen was rejected because it was "too formed". She reports she has stopped several of her medications because she thought they may be causing her "diarrhea".  She's had a recent increase in her Lamictal but no other medication changes. She voiced no specific complaints except for the persistent peripheral neuropathy.  She says that her neuropathy is better with the change in the medication. She also has uncontrolled blood sugar recently. PCP is following this and has her on Levemir. Her primary care physician, Dr. Wilson Singer,  is monitoring/treating her elevated TSH and blood sugars. The patient denied having any significant chest pain but has shortness of breath with exertion with no cough or hemoptysis. She denied having any significant weight loss or night sweats. She has no nausea or vomiting. She had a recent upper endoscopy with dilatation, approximately 1 month ago. States that she still has the sensation of medications and water getting hung up in her throat. She is today to start cycle #15 of her treatment with Nivolumab.  MEDICAL HISTORY: Past Medical History  Diagnosis Date  . Hypertension   . Rheumatoid arthritis   . GERD (gastroesophageal reflux disease)     no meds for  . Hx of radiation therapy 12/16/13-01/30/14    lung 66Gy  . Neuropathy   . Shortness of breath dyspnea     with exertion  . History of hiatal hernia   . Diabetes mellitus     insulin  . Cancer dx'd 09/2013    Lung ca    ALLERGIES:  is allergic to remicade; carboplatin; and hydromorphone.  MEDICATIONS:  Current Outpatient Prescriptions  Medication Sig Dispense Refill  . Gabapentin, Once-Daily, (GRALISE) 600 MG TABS Take 1,800 mg by mouth daily. 90 tablet 6  . HUMALOG KWIKPEN 100 UNIT/ML KiwkPen   0  . LamoTRIgine (LAMICTAL XR) 25  MG TB24 tablet Take 1 tablet (25 mg total) by mouth daily. 30 tablet 3  . LEVEMIR FLEXTOUCH 100 UNIT/ML Pen   0  . lidocaine (LIDODERM) 5 % APPLY 3 PATCHES ONTO THE SKIN ONCE DAILY. LEAVE ON FOR 12 HOURS AND OFF FOR 12 HOURS. MAY CUT PATCH TO FIT 90 patch 3  . losartan-hydrochlorothiazide (HYZAAR) 100-25 MG per tablet Take 1 tablet by mouth  daily.    [name] nystatin-triamcinolone (MYCOLOG II) cream Apply 1 application topically as directed.  0  . potassium chloride SA (K-DUR,KLOR-CON) 20 MEQ tablet Take 40 mEq by mouth daily.    . prochlorperazine (COMPAZINE) 10 MG tablet Take 10 mg by mouth every 6 (six) hours as needed for nausea or vomiting.    . B-D ULTRAFINE III SHORT PEN 31G X 8 MM MISC   0  . docusate sodium (COLACE) 100 MG capsule Take 1 capsule (100 mg total) by mouth every 12 (twelve) hours. (Patient not taking: Reported on 01/26/2015) 60 capsule 0  . dronabinol (MARINOL) 10 MG capsule Take 2 capsules (20 mg total) by mouth 2 (two) times daily before a meal. (Patient not taking: Reported on 01/26/2015) 120 capsule 5  . Fluticasone-Salmeterol (ADVAIR DISKUS) 100-50 MCG/DOSE AEPB Inhale 1 puff into the lungs 2 (two) times daily. (Patient not taking: Reported on 01/26/2015) 60 each 5  . ibuprofen (ADVIL,MOTRIN) 600 MG tablet Take 1 tablet (600 mg total) by mouth every 6 (six) hours as needed. Take with food. (Patient not taking: Reported on 01/26/2015) 30 tablet 0  . lansoprazole (PREVACID SOLUTAB) 30 MG disintegrating tablet Take 1 tablet (30 mg total) by mouth daily. (Patient not taking: Reported on 01/26/2015) 90 tablet 3  . meloxicam (MOBIC) 15 MG tablet Take 15 mg by mouth daily as needed.     . metFORMIN (GLUCOPHAGE-XR) 500 MG 24 hr tablet Take 500 mg by mouth 4 (four) times daily.  5  . pantoprazole (PROTONIX) 40 MG tablet Take 1 tablet (40 mg total) by mouth daily. (Patient not taking: Reported on 01/26/2015) 90 tablet 0  . UNABLE TO FIND Med Name: Metanex- Vitamin B    . zolpidem (AMBIEN) 10 MG tablet Take 10 mg by mouth at bedtime as needed for sleep.     No current facility-administered medications for this visit.   Facility-Administered Medications Ordered in Other Visits  Medication Dose Route Frequency Provider Last Rate Last Dose  . sodium chloride 0.9 % injection 10 mL  10 mL Intracatheter PRN Curt Bears, MD   10 mL  at 12/30/13 1121    SURGICAL HISTORY:  Past Surgical History  Procedure Laterality Date  . Cholecystectomy    . Abdominal hysterectomy    . Dilation and curettage of uterus    . Abdominal surgery    . Tonsillectomy    . Esophagogastroduodenoscopy (egd) with propofol N/A 10/09/2014    Procedure: ESOPHAGOGASTRODUODENOSCOPY (EGD) WITH PROPOFOL;  Surgeon: Inda Castle, MD;  Location: [facility];  Service: Endoscopy;  Laterality: N/A;  . Savory dilation N/A 10/09/2014    Procedure: SAVORY DILATION;  Surgeon: Inda Castle, MD;  Location: [facility];  Service: Endoscopy;  Laterality: N/A;  . Esophagogastroduodenoscopy N/A 12/12/2014    Procedure: ESOPHAGOGASTRODUODENOSCOPY (EGD);  Surgeon: Inda Castle, MD;  Location: [facility];  Service: Endoscopy;  Laterality: N/A;  with dilation  . Balloon dilation N/A 12/12/2014    Procedure: BALLOON DILATION;  Surgeon: Inda Castle, MD;  Location: [facility];  Service: Endoscopy;  Laterality: N/A;  REVIEW OF SYSTEMS:  Constitutional: negative Eyes: negative Ears, nose, mouth, throat, and face: negative Respiratory: negative Cardiovascular: negative Gastrointestinal: positive for diarrhea Genitourinary:negative Integument/breast: negative Hematologic/lymphatic: negative Musculoskeletal:negative Neurological: positive for paresthesia Behavioral/Psych: negative Endocrine: negative Allergic/Immunologic: negative   PHYSICAL EXAMINATION: General appearance: alert, cooperative and no distress Head: Normocephalic, without obvious abnormality, atraumatic Neck: no adenopathy, no JVD, supple, symmetrical, trachea midline and thyroid not enlarged, symmetric, no tenderness/mass/nodules Lymph nodes: Cervical, supraclavicular, and axillary nodes normal. Resp: clear to auscultation bilaterally Back: symmetric, no curvature. ROM normal. No CVA tenderness. Cardio: regular rate and rhythm, S1, S2 normal, no murmur, click, rub or gallop GI:  soft, non-tender; bowel sounds normal; no masses,  no organomegaly Extremities: extremities normal, atraumatic, no cyanosis or edema Neurologic: Alert and oriented X 3, normal strength and tone. Normal symmetric reflexes. Normal coordination and gait  ECOG PERFORMANCE STATUS: 1 - Symptomatic but completely ambulatory  Blood pressure 128/58, pulse 95, temperature 97.4 F (36.3 C), temperature source Oral, resp. rate 18, height 5\' 6"  (1.676 m), weight 125 lb 1.6 oz (56.745 kg), SpO2 98 %.  LABORATORY DATA: Lab Results  Component Value Date   WBC 7.4 01/26/2015   HGB 14.0 01/26/2015   HCT 42.3 01/26/2015   MCV 93.0 01/26/2015   PLT 162 01/26/2015      Chemistry      Component Value Date/Time   NA 134* 01/26/2015 0913   NA 138 10/09/2014 1328   K 4.4 01/26/2015 0913   K 3.7 10/09/2014 1328   CL 92* 09/08/2014 1417   CO2 19* 01/26/2015 0913   CO2 28 09/08/2014 1417   BUN 15.1 01/26/2015 0913   BUN 15 09/08/2014 1417   CREATININE 1.0 01/26/2015 0913   CREATININE 0.67 09/08/2014 1417   GLU  01/26/2015 1126    Above linear limit, confirmation by reference method to follow      Component Value Date/Time   CALCIUM 8.9 01/26/2015 0913   CALCIUM 9.6 09/08/2014 1417   ALKPHOS 150 01/26/2015 0913   ALKPHOS 118* 09/08/2014 1417   AST 21 01/26/2015 0913   AST 17 09/08/2014 1417   ALT 24 01/26/2015 0913   ALT 25 09/08/2014 1417   BILITOT 1.11 01/26/2015 0913   BILITOT 0.4 09/08/2014 1417       RADIOGRAPHIC STUDIES: EXAM: CT CHEST WITH CONTRAST  TECHNIQUE: Multidetector CT imaging of the chest was performed during intravenous contrast administration.  CONTRAST: 52mL OMNIPAQUE IOHEXOL 300 MG/ML SOLN  COMPARISON: 10/22/2014  FINDINGS: Mediastinum/Nodes: The heart is normal in size. No pericardial effusion.  Mild coronary atherosclerosis in the LAD.  Atherosclerotic calcifications of the aortic arch.  8 mm short axis subcarinal node (series 2/ image 25),  unchanged. No suspicious hilar or axillary lymphadenopathy.  Diffuse mid/lower esophageal wall thickening (series 2/ image 28), correlate for esophagitis.  Visualized thyroid is unremarkable.  Lungs/Pleura: Radiation changes with right upper lobe collapse.  Underlying 2.0 x 1.2 cm hypoenhancing lesion medially (series 2/image 19), previously 1.9 x 1.1 cm when measured in a similar fashion.  Additional radiation changes in the right lower lobe (series 5/image 21).  Calcified granuloma in the left lower lobe (series 5/ image 42). Visualized left lung is otherwise clear.  Trace right pleural effusion. No pneumothorax.  Upper abdomen: Visualized upper abdomen is notable for a 1.2 x 1.7 cm cyst in the medial segment left hepatic lobe (series 2/image 55). Splenic granulomata. Cholecystectomy clips.  Musculoskeletal: Degenerative changes of the visualized thoracolumbar spine.  IMPRESSION: Stable radiation changes  in the right upper lobe and right lower lobe.  Underlying 2.0 cm lesion medially in the right upper lobe is grossly unchanged and likely reflects post treatment changes. If there is concern for viable tumor, consider PET-CT.  8 mm short axis subcarinal node, unchanged.  Trace right pleural effusion.   Electronically Signed  By: Julian Hy M.D.  On: 12/19/2014 14:49  ASSESSMENT AND PLAN: This is a very pleasant 74 year old white female with history of stage IIIB non-small cell lung cancer status post a course of concurrent chemoradiation with weekly carboplatin and paclitaxel status post 7 cycles. The patient tolerated her treatment fairly well with no significant adverse effects except for the radiation induced esophagitis.  She was also treated with 3 cycles of consolidation chemotherapy with carboplatin and paclitaxel but carboplatin was discontinued the start of cycle #2 secondary to hypersensitivity reaction. Her restaging scan showed  evidence for disease progression and the patient is currently undergoing immunotherapy with Nivolumab 3 mg/KG every 2 weeks is status post 14 cycles.  Patient was discussed with and also seen by Dr. Julien Nordmann. Her most recent CT scan results showed stable disease. Her serum glucose today is 628 and we will hold her nivolumab treatment as a result. She was given 20 units of regular insulin in our infusion area. The post insulin reading was still "high" ( beyond the upper limit of the glucometer). She was scheduled to see Dr. Wilson Singer at 12:15 today for further management of her hyperglycemia. We'll reschedule cycle #15 to begin in one week as long as the diarrhea is controlled or has resolved and her serum glucose is under control.   She will follow-up  in 3 weeks for reevaluation before starting the next dose of her Nivolumab. She was advised to call immediately if she has any concerning symptoms in the interval. The patient voices understanding of current disease status and treatment options and is in agreement with the current care plan.  All questions were answered. The patient knows to call the clinic with any problems, questions or concerns. We can certainly see the patient much sooner if necessary.  Carlton Adam, PA-C  01/26/2015    ADDENDUM: Hematology/Oncology Attending: I had a face to face encounter with the patient. I recommended her care plan. This is a very pleasant 74 years old white female with stage IIIB non-small cell lung cancer, squamous cell carcinoma who is currently undergoing second line immunotherapy with Nivolumab status post 14 cycles. The patient is tolerating her treatment well except for few episodes of diarrhea and today significant hyperglycemia that could be secondary to her treatment with Nivolumab. I recommended for the patient to hold her treatment with Nivolumab today until we have better control of her hyperglycemia. She will receive 20 units of regular insulin in  the treatment room today. We also advised her to consult with her primary care physician Dr. Wilson Singer for management of her hyperglycemia. She will come back for follow-up visit in 3 weeks for reevaluation with the next dose of Nivolumab if her hyperglycemia is well-controlled. She was advised to call immediately if she has any concerning symptoms in the interval.  Disclaimer: This note was dictated with voice recognition software. Similar sounding words can inadvertently be transcribed and may be missed upon review. Eilleen Kempf., MD 01/31/2015

## 2015-01-26 NOTE — Patient Instructions (Signed)

## 2015-01-27 ENCOUNTER — Encounter (HOSPITAL_COMMUNITY): Payer: Medicare Other

## 2015-01-27 ENCOUNTER — Ambulatory Visit (INDEPENDENT_AMBULATORY_CARE_PROVIDER_SITE_OTHER): Payer: Medicare Other | Admitting: Internal Medicine

## 2015-01-27 ENCOUNTER — Encounter: Payer: Self-pay | Admitting: Internal Medicine

## 2015-01-27 VITALS — BP 98/68 | HR 107 | Ht 66.0 in | Wt 124.0 lb

## 2015-01-27 DIAGNOSIS — R530 Neoplastic (malignant) related fatigue: Secondary | ICD-10-CM | POA: Diagnosis not present

## 2015-01-27 DIAGNOSIS — J439 Emphysema, unspecified: Secondary | ICD-10-CM

## 2015-01-27 NOTE — Patient Instructions (Addendum)
ICD-9-CM ICD-10-CM   1. Pulmonary emphysema, unspecified emphysema type 492.8 J43.9   2. Neoplastic malignant related fatigue 780.79 R53.0     Continue advair  Followup  3 months Will check with Dr Julien Nordmann if hyperglycemia relatd to [facility]

## 2015-01-27 NOTE — Progress Notes (Signed)
Subjective:    Patient ID: Megan [name], female    DOB: Sep 02, 1941, 74 y.o.   MRN: [id number]  HPI     OV 11/26/2014  Chief Complaint  Patient presents with  . Follow-up    Pt stated her mobility has decreased. Pt c/o DOE, dry cough. Pt denies CP/tightness.     FU COOPD, Lung cancer with resultant neuropathy, fatigue, etc.. (declined hospice consideration dec 2015). Currently in Nivolumab Rx.    She seems a bit better overall. Her female friend Mr Megan [name] is with her today. SHe is now several months into Nivolumab Rx. ECOG is 3 - >70% if time in chair or bed during waking hours per female friend. This is an improvement. Possibly related to home PT visitiing her but she denies this was the case. Home PT is complete. SHe is now keen to join pulmonary rehab at [facility]. She feels it helped her immensely in past. Peripheral neuropathy still a problem - her neurologist wrote to me and wondered about starting lamictal; I am fine wit hthat. (she has failed cymbalta and lyrica in past). Mood some better. Dyspnea is not the major problem of all things below    OV 01/27/2015  Chief Complaint  Patient presents with  . Follow-up    Pt states she has not noticed a difference since last OV. Pt c/o weakness and neuropathy. Pt stated she now has DM and is taking SQ insulin. Pt c/o dysphasia on Sunday and called 911 and pt stated it was hpoglycemia, pts CBG was 55. Pt stated she is now having issues trying to get BS down to 500, endocrinologist is aware .    FU COOPD, Lung cancer with resultant neuropathy, fatigue, etc.. (declined hospice consideration dec 2015). Currently in Nivolumab Rx.   Overall better. Now driving. Could not enter pulmonary rehab due to hyperglycemia - difficult to control Nivlumab on hold. CT chest feb 2016 show cancer under control. Depression, fatigue, neuropathy all better. Of note, she had hypolgycemai few days ago and got aphasic.    [place] Symptom Assessment  Numerical Scale 0 is no problem -> 10 worst problem 07/29/2014   08/27/2014  09/30/2014  01/27/2015   No Pain -> Worst pain 6, in mid scapula, left shoulder, buttock and thigh and feet - did have platin neuropathy 8.5 7, some better 5  No Tiredness -> Worset tiredness 9 7 9, worse 5  No Nausea -> Worst nausea 1 0 9, worse 3  No Depression -> Worst depression 4 9 9, same 3  No Anxiety -> Worst Anxiety 6 9 7, same 6  No Drowsiness -> Worst Drowsiness 10 7 9, worse 3  Best appetite-> Worst Appetitle 7 10 8  same 7  Best Feeling of well being -> Worst feeling 1 6 7, worse 8  No dyspnea-> Worst dyspnea 9 8 4, better 4  Other problem (none -> severe) none Tachycardia - Physical therapy refuses to work wit her. Now in AECOPD with wheeze on exam 10 nos 7. hypoglycemia and aphasia. Called EMS easter sunday  Completed by  MR/patent MR/patient patient patient  comments  All worse Some better, some worse Improved, driving around       Current outpatient prescriptions:  .  B-D ULTRAFINE III SHORT PEN 31G X 8 MM MISC, , Disp: , Rfl: 0 .  Gabapentin, Once-Daily, (GRALISE) 600 MG TABS, Take 1,800 mg by mouth daily., Disp: 90 tablet, Rfl: 6 .  HUMALOG KWIKPEN 100 UNIT/ML KiwkPen, ,  Disp: , Rfl: 0 .  LamoTRIgine (LAMICTAL XR) 25 MG TB24 tablet, Take 1 tablet (25 mg total) by mouth daily., Disp: 30 tablet, Rfl: 3 .  LEVEMIR FLEXTOUCH 100 UNIT/ML Pen, , Disp: , Rfl: 0 .  lidocaine (LIDODERM) 5 %, APPLY 3 PATCHES ONTO THE SKIN ONCE DAILY. LEAVE ON FOR 12 HOURS AND OFF FOR 12 HOURS. MAY CUT PATCH TO FIT, Disp: 90 patch, Rfl: 3 .  losartan-hydrochlorothiazide (HYZAAR) 100-25 MG per tablet, Take 1 tablet by mouth daily., Disp: , Rfl:  .  nystatin-triamcinolone (MYCOLOG II) cream, Apply 1 application topically as directed., Disp: , Rfl: 0 .  potassium chloride SA (K-DUR,KLOR-CON) 20 MEQ tablet, Take 40 mEq by mouth daily., Disp: , Rfl:  .  prochlorperazine  (COMPAZINE) 10 MG tablet, Take 10 mg by mouth every 6 (six) hours as needed for nausea or vomiting., Disp: , Rfl:  .  docusate sodium (COLACE) 100 MG capsule, Take 1 capsule (100 mg total) by mouth every 12 (twelve) hours. (Patient not taking: Reported on 01/27/2015), Disp: 60 capsule, Rfl: 0 .  dronabinol (MARINOL) 10 MG capsule, Take 2 capsules (20 mg total) by mouth 2 (two) times daily before a meal. (Patient not taking: Reported on 01/27/2015), Disp: 120 capsule, Rfl: 5 .  Fluticasone-Salmeterol (ADVAIR DISKUS) 100-50 MCG/DOSE AEPB, Inhale 1 puff into the lungs 2 (two) times daily. (Patient not taking: Reported on 01/27/2015), Disp: 60 each, Rfl: 5 .  ibuprofen (ADVIL,MOTRIN) 600 MG tablet, Take 1 tablet (600 mg total) by mouth every 6 (six) hours as needed. Take with food. (Patient not taking: Reported on 01/27/2015), Disp: 30 tablet, Rfl: 0 .  lansoprazole (PREVACID SOLUTAB) 30 MG disintegrating tablet, Take 1 tablet (30 mg total) by mouth daily. (Patient not taking: Reported on 01/27/2015), Disp: 90 tablet, Rfl: 3 .  meloxicam (MOBIC) 15 MG tablet, Take 15 mg by mouth daily as needed. , Disp: , Rfl:  .  metFORMIN (GLUCOPHAGE-XR) 500 MG 24 hr tablet, Take 500 mg by mouth 4 (four) times daily., Disp: , Rfl: 5 .  pantoprazole (PROTONIX) 40 MG tablet, Take 1 tablet (40 mg total) by mouth daily. (Patient not taking: Reported on 01/27/2015), Disp: 90 tablet, Rfl: 0 .  UNABLE TO FIND, Med Name: Metanex- Vitamin B, Disp: , Rfl:  .  zolpidem (AMBIEN) 10 MG tablet, Take 10 mg by mouth at bedtime as needed for sleep., Disp: , Rfl:  No current facility-administered medications for this visit.  Facility-Administered Medications Ordered in Other Visits:  .  sodium chloride 0.9 % injection 10 mL, 10 mL, Intracatheter, PRN, Curt Bears, MD, 10 mL at 12/30/13 1121   Review of Systems  Constitutional: Negative for fever and unexpected weight change.  HENT: Negative for congestion, dental problem, ear pain,  nosebleeds, postnasal drip, rhinorrhea, sinus pressure, sneezing, sore throat and trouble swallowing.   Eyes: Negative for redness and itching.  Respiratory: Positive for cough and shortness of breath. Negative for chest tightness and wheezing.   Cardiovascular: Negative for palpitations and leg swelling.  Gastrointestinal: Negative for nausea and vomiting.  Genitourinary: Negative for dysuria.  Musculoskeletal: Negative for joint swelling.  Skin: Negative for rash.  Neurological: Negative for headaches.  Hematological: Does not bruise/bleed easily.  Psychiatric/Behavioral: Negative for dysphoric mood. The patient is not nervous/anxious.        Objective:   Physical Exam   Filed Vitals:   01/27/15 1612  BP: 98/68  Pulse: 107  Height: 5\' 6"  (1.676 m)  Weight: 124 lb (  56.246 kg)  SpO2: 97%    Estimated body mass index is 20.02 kg/(m^2) as calculated from the following:   Height as of this encounter: 5\' 6"  (1.676 m).   Weight as of this encounter: 124 lb (56.246 kg).  onstitutional: She is oriented to person, place, and time. She appears well-developed and well-nourished. No distress.  Frail No longer in wheelchair  HENT:  Head: Normocephalic and atraumatic.  Right Ear: External ear normal.  Left Ear: External ear normal.  Mouth/Throat: Oropharynx is clear and moist. No oropharyngeal exudate.  Wig +  Eyes: Conjunctivae and EOM are normal. Pupils are equal, round, and reactive to light. Right eye exhibits no discharge. Left eye exhibits no discharge. No scleral icterus.  Neck: Normal range of motion. Neck supple. No JVD present. No tracheal deviation present. No thyromegaly present.  Cardiovascular: Normal rate, regular rhythm, normal heart sounds and intact distal pulses.  Exam reveals no gallop and no friction rub.   No murmur heard. Pulmonary/Chest: Effort normal and breath sounds normal. No respiratory distress. She has no wheezes. She has no rales. She exhibits no  tenderness.  Abdominal: Soft. Bowel sounds are normal. She exhibits no distension and no mass. There is no tenderness. There is no rebound and no guarding.  Musculoskeletal: Normal range of motion. She exhibits no edema or tenderness.  Walks with cane  Lymphadenopathy:    She has no cervical adenopathy.  Neurological: She is alert and oriented to person, place, and time. She has normal reflexes. No cranial nerve deficit. She exhibits normal muscle tone. Coordination normal.  Skin: Skin is warm and dry. No rash noted. She is not diaphoretic. No erythema. No pallor.  Psychiatric: She has a normal mood and affect. Her behavior is normal. Judgment and thought content normal.  Improved mood  Vitals reviewe     Assessment & Plan:     ICD-9-CM ICD-10-CM   1. Pulmonary emphysema, unspecified emphysema type 492.8 J43.9   2. Neoplastic malignant related fatigue 780.79 R53.0    Improved overall COPD stable Hyperglycemia prob ? Related to Nivolumab - most likely  Per uptodate  REC Continue advair  Followup  3 months Will check with Dr Julien Nordmann if hyperglycemia relatd to Nivolumab (addendum: high sugars related to nivolumab but MOA unknown per Dr Julien Nordmann)  > 50% of this > 25 min visit spent in face to face counseling or coordination of care (15 min visit converted to 25 min)   Dr. Brand Males, M.D., [facility].C.P Pulmonary and Critical Care Medicine Staff Physician [facility] Pager: [phone], If no answer or between  15:00h - 7:00h: call [phone]  01/27/2015 4:59 PM

## 2015-01-28 ENCOUNTER — Ambulatory Visit: Payer: Medicare Other | Admitting: Internal Medicine

## 2015-01-29 ENCOUNTER — Encounter (HOSPITAL_COMMUNITY): Payer: Medicare Other

## 2015-01-29 ENCOUNTER — Other Ambulatory Visit: Payer: Self-pay | Admitting: Medical Oncology

## 2015-01-29 DIAGNOSIS — C3490 Malignant neoplasm of unspecified part of unspecified bronchus or lung: Secondary | ICD-10-CM

## 2015-01-29 NOTE — Patient Instructions (Signed)
You are to see Dr. Wilson Singer later today regarding your blood sugar control. Your immunotherapy treatment is being postponed for one week. Follow up in 3 weeks for re-evaluation

## 2015-02-02 ENCOUNTER — Other Ambulatory Visit (HOSPITAL_BASED_OUTPATIENT_CLINIC_OR_DEPARTMENT_OTHER): Payer: Medicare Other

## 2015-02-02 ENCOUNTER — Ambulatory Visit (HOSPITAL_BASED_OUTPATIENT_CLINIC_OR_DEPARTMENT_OTHER): Payer: Medicare Other

## 2015-02-02 VITALS — BP 123/85 | HR 92 | Temp 97.8°F | Resp 18

## 2015-02-02 DIAGNOSIS — C342 Malignant neoplasm of middle lobe, bronchus or lung: Secondary | ICD-10-CM

## 2015-02-02 DIAGNOSIS — Z5112 Encounter for antineoplastic immunotherapy: Secondary | ICD-10-CM | POA: Diagnosis not present

## 2015-02-02 DIAGNOSIS — R739 Hyperglycemia, unspecified: Secondary | ICD-10-CM | POA: Diagnosis not present

## 2015-02-02 DIAGNOSIS — C3490 Malignant neoplasm of unspecified part of unspecified bronchus or lung: Secondary | ICD-10-CM

## 2015-02-02 LAB — WHOLE BLOOD GLUCOSE
GLUCOSE: 299 mg/dL — AB (ref 70–100)
HRS PC: 2 h

## 2015-02-02 LAB — CBC WITH DIFFERENTIAL/PLATELET
BASO%: 0.3 % (ref 0.0–2.0)
Basophils Absolute: 0 10*3/uL (ref 0.0–0.1)
EOS%: 3.5 % (ref 0.0–7.0)
Eosinophils Absolute: 0.2 10*3/uL (ref 0.0–0.5)
HCT: 40.6 % (ref 34.8–46.6)
HGB: 13.7 g/dL (ref 11.6–15.9)
LYMPH#: 0.7 10*3/uL — AB (ref 0.9–3.3)
LYMPH%: 11 % — ABNORMAL LOW (ref 14.0–49.7)
MCH: 31 pg (ref 25.1–34.0)
MCHC: 33.7 g/dL (ref 31.5–36.0)
MCV: 91.9 fL (ref 79.5–101.0)
MONO#: 0.5 10*3/uL (ref 0.1–0.9)
MONO%: 7.6 % (ref 0.0–14.0)
NEUT#: 4.7 10*3/uL (ref 1.5–6.5)
NEUT%: 77.6 % — ABNORMAL HIGH (ref 38.4–76.8)
Platelets: 172 10*3/uL (ref 145–400)
RBC: 4.42 10*6/uL (ref 3.70–5.45)
RDW: 14 % (ref 11.2–14.5)
WBC: 6 10*3/uL (ref 3.9–10.3)

## 2015-02-02 LAB — COMPREHENSIVE METABOLIC PANEL (CC13)
ALBUMIN: 3.7 g/dL (ref 3.5–5.0)
ALT: 17 U/L (ref 0–55)
ANION GAP: 12 meq/L — AB (ref 3–11)
AST: 18 U/L (ref 5–34)
Alkaline Phosphatase: 120 U/L (ref 40–150)
BUN: 19.8 mg/dL (ref 7.0–26.0)
CO2: 26 meq/L (ref 22–29)
Calcium: 9.4 mg/dL (ref 8.4–10.4)
Chloride: 98 mEq/L (ref 98–109)
Creatinine: 0.8 mg/dL (ref 0.6–1.1)
EGFR: 69 mL/min/{1.73_m2} — AB (ref >90)
GLUCOSE: 345 mg/dL — AB (ref 70–140)
POTASSIUM: 4.9 meq/L (ref 3.5–5.1)
SODIUM: 137 meq/L (ref 136–145)
Total Bilirubin: 0.45 mg/dL (ref 0.20–1.20)
Total Protein: 6.5 g/dL (ref 6.4–8.3)

## 2015-02-02 MED ORDER — SODIUM CHLORIDE 0.9 % IV SOLN
Freq: Once | INTRAVENOUS | Status: AC
  Administered 2015-02-02: 10:00:00 via INTRAVENOUS

## 2015-02-02 MED ORDER — SODIUM CHLORIDE 0.9 % IV SOLN
3.0000 mg/kg | Freq: Once | INTRAVENOUS | Status: AC
  Administered 2015-02-02: 180 mg via INTRAVENOUS
  Filled 2015-02-02: qty 18

## 2015-02-02 MED ORDER — INSULIN REGULAR HUMAN 100 UNIT/ML IJ SOLN
15.0000 [IU] | Freq: Once | INTRAMUSCULAR | Status: AC
  Administered 2015-02-02: 15 [IU] via SUBCUTANEOUS
  Filled 2015-02-02: qty 0.15

## 2015-02-02 NOTE — Progress Notes (Signed)
Pt reports difficulty controlling blood sugars at home. States EMS has been out twice in the last week for low blood sugars. Pt states she does not check her blood sugar at home. Pt has equipment to check blood sugar, but states she does not know how to use the machine.  Reports she saw her PCP, Dr. Wilson Singer, on Friday 01/30/15.  Emphasized with patient the importance of checking blood sugars before giving herself insulin and eating regular meals and snacks.    Pt states she called her PCP, Dr. Eugenio Hoes office and confirmed that she does have an appointment on 02/03/15 at 2:30 pm with a diabetes educator there.  Emphasized the importance of keeping this appointment so that patient can learn how to better control her blood sugars at home. Pt verbalized understanding.  Per Dr. Julien Nordmann okay to proceed with treatment today with blood glucose of 345.  Verbal order received to give patient 15 units regular insulin subcutaneously and recheck cbg prior to discharge.   1355: Repeat blood sugar 299. Dr. Julien Nordmann notified. Okay to discharge patient home; pt to follow-up with PCP.

## 2015-02-02 NOTE — Patient Instructions (Signed)
[facility] Discharge Instructions for Patients  Today you received the following: Nivolumab   To help prevent nausea and vomiting after your treatment, we encourage you to take your nausea medication as directed.    If you develop nausea and vomiting that is not controlled by your nausea medication, call the clinic.   BELOW ARE SYMPTOMS THAT SHOULD BE REPORTED IMMEDIATELY:  *FEVER GREATER THAN 100.5 F  *CHILLS WITH OR WITHOUT FEVER  NAUSEA AND VOMITING THAT IS NOT CONTROLLED WITH YOUR NAUSEA MEDICATION  *UNUSUAL SHORTNESS OF BREATH  *UNUSUAL BRUISING OR BLEEDING  TENDERNESS IN MOUTH AND THROAT WITH OR WITHOUT PRESENCE OF ULCERS  *URINARY PROBLEMS  *BOWEL PROBLEMS  UNUSUAL RASH Items with * indicate a potential emergency and should be followed up as soon as possible.  Feel free to call the clinic you have any questions or concerns. The clinic phone number is [phone].  Please show the CHEMO ALERT CARD at check-in to the Emergency Department and triage nurse.   

## 2015-02-03 ENCOUNTER — Encounter (HOSPITAL_COMMUNITY): Payer: Medicare Other

## 2015-02-05 ENCOUNTER — Encounter (HOSPITAL_COMMUNITY): Payer: Medicare Other

## 2015-02-06 ENCOUNTER — Telehealth: Payer: Self-pay | Admitting: *Deleted

## 2015-02-06 NOTE — Telephone Encounter (Signed)
Per staff message and POF I have scheduled appts. Advised scheduler of appts. JMW  

## 2015-02-08 ENCOUNTER — Other Ambulatory Visit: Payer: Self-pay | Admitting: Neurology

## 2015-02-09 ENCOUNTER — Ambulatory Visit: Payer: Medicare Other | Admitting: Internal Medicine

## 2015-02-09 ENCOUNTER — Other Ambulatory Visit: Payer: Medicare Other

## 2015-02-09 NOTE — Telephone Encounter (Signed)
I spoke with Pharmacist who said they are unable to locate the Rx written in Jan.

## 2015-02-10 ENCOUNTER — Ambulatory Visit (INDEPENDENT_AMBULATORY_CARE_PROVIDER_SITE_OTHER): Payer: Medicare Other | Admitting: Podiatry

## 2015-02-10 ENCOUNTER — Encounter (HOSPITAL_COMMUNITY): Payer: Medicare Other

## 2015-02-10 ENCOUNTER — Telehealth: Payer: Self-pay | Admitting: *Deleted

## 2015-02-10 DIAGNOSIS — M2042 Other hammer toe(s) (acquired), left foot: Secondary | ICD-10-CM | POA: Diagnosis not present

## 2015-02-10 DIAGNOSIS — E1142 Type 2 diabetes mellitus with diabetic polyneuropathy: Secondary | ICD-10-CM

## 2015-02-10 DIAGNOSIS — M2041 Other hammer toe(s) (acquired), right foot: Secondary | ICD-10-CM

## 2015-02-10 NOTE — Progress Notes (Signed)
Patient ID: Megan [name], female   DOB: 08/16/1941, 74 y.o.   MRN: [id number] PATIENT PRESENTED FOR DIABETIC SHOE PICK UP.  PATIENT TRIED ON SHOES FOR GOOD FIT PATIENT EXPRESSED CONCERN THAT SHE MAY NOT BE ABLE TO WEAR THEM BUT WOULD NOT CLARIFY PATIENT STATED SHE WANTED TO CHECK OUT AND LEFT PRIOR TO SEEING DR HYATT.  PATIENT LEFT WITH 1 PAIR ORTHO FEET [facility] 10 WIDE 3 PAIRS CUSTOM DIABETIC INSERTS. WRITTEN BREAK IN AND WEAR INSTRUCTIONS GIVEN

## 2015-02-10 NOTE — Patient Instructions (Signed)

## 2015-02-10 NOTE — Telephone Encounter (Signed)
Left message for patient to call us back to see where Rx for marinol needed to be filled at. Pt has to different pharmacies in chart. Please ask if pt calls back. Also wanted to see how pt was doing on lamictal.

## 2015-02-11 NOTE — Telephone Encounter (Signed)
Patient called and stated she has stop taking the Rx Marinol for about 3 weeks and questioning if she needs to resume to forward Rx to [facility] on [address].  Lamictal has caused severe night sweats, at least 10-15 per night and hot flashes during the day.  Please call and advise.  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

## 2015-02-12 ENCOUNTER — Encounter (HOSPITAL_COMMUNITY): Payer: Medicare Other

## 2015-02-16 ENCOUNTER — Other Ambulatory Visit: Payer: Medicare Other

## 2015-02-16 ENCOUNTER — Ambulatory Visit: Payer: Medicare Other | Admitting: Physician Assistant

## 2015-02-16 ENCOUNTER — Ambulatory Visit: Payer: Medicare Other

## 2015-02-17 ENCOUNTER — Encounter (HOSPITAL_COMMUNITY): Payer: Medicare Other

## 2015-02-17 ENCOUNTER — Ambulatory Visit (HOSPITAL_BASED_OUTPATIENT_CLINIC_OR_DEPARTMENT_OTHER): Payer: Medicare Other

## 2015-02-17 ENCOUNTER — Encounter: Payer: Self-pay | Admitting: Physician Assistant

## 2015-02-17 ENCOUNTER — Ambulatory Visit (HOSPITAL_BASED_OUTPATIENT_CLINIC_OR_DEPARTMENT_OTHER): Payer: Medicare Other | Admitting: Physician Assistant

## 2015-02-17 ENCOUNTER — Telehealth: Payer: Self-pay | Admitting: Internal Medicine

## 2015-02-17 ENCOUNTER — Other Ambulatory Visit (HOSPITAL_BASED_OUTPATIENT_CLINIC_OR_DEPARTMENT_OTHER): Payer: Medicare Other

## 2015-02-17 ENCOUNTER — Telehealth: Payer: Self-pay | Admitting: *Deleted

## 2015-02-17 VITALS — BP 102/55 | HR 54 | Temp 97.7°F | Resp 19 | Ht 66.0 in | Wt 130.6 lb

## 2015-02-17 DIAGNOSIS — C342 Malignant neoplasm of middle lobe, bronchus or lung: Secondary | ICD-10-CM | POA: Diagnosis not present

## 2015-02-17 DIAGNOSIS — E119 Type 2 diabetes mellitus without complications: Secondary | ICD-10-CM

## 2015-02-17 DIAGNOSIS — Z5112 Encounter for antineoplastic immunotherapy: Secondary | ICD-10-CM

## 2015-02-17 DIAGNOSIS — Z79899 Other long term (current) drug therapy: Secondary | ICD-10-CM

## 2015-02-17 DIAGNOSIS — C3491 Malignant neoplasm of unspecified part of right bronchus or lung: Secondary | ICD-10-CM

## 2015-02-17 LAB — CBC WITH DIFFERENTIAL/PLATELET
BASO%: 1 % (ref 0.0–2.0)
Basophils Absolute: 0 10*3/uL (ref 0.0–0.1)
EOS ABS: 0.4 10*3/uL (ref 0.0–0.5)
EOS%: 7.2 % — ABNORMAL HIGH (ref 0.0–7.0)
HCT: 38.5 % (ref 34.8–46.6)
HGB: 12.7 g/dL (ref 11.6–15.9)
LYMPH#: 0.8 10*3/uL — AB (ref 0.9–3.3)
LYMPH%: 16.2 % (ref 14.0–49.7)
MCH: 29.8 pg (ref 25.1–34.0)
MCHC: 33 g/dL (ref 31.5–36.0)
MCV: 90.5 fL (ref 79.5–101.0)
MONO#: 0.4 10*3/uL (ref 0.1–0.9)
MONO%: 8.6 % (ref 0.0–14.0)
NEUT#: 3.4 10*3/uL (ref 1.5–6.5)
NEUT%: 67 % (ref 38.4–76.8)
Platelets: 189 10*3/uL (ref 145–400)
RBC: 4.25 10*6/uL (ref 3.70–5.45)
RDW: 14.2 % (ref 11.2–14.5)
WBC: 5.1 10*3/uL (ref 3.9–10.3)

## 2015-02-17 LAB — COMPREHENSIVE METABOLIC PANEL (CC13)
ALBUMIN: 3.3 g/dL — AB (ref 3.5–5.0)
ALK PHOS: 118 U/L (ref 40–150)
ALT: 16 U/L (ref 0–55)
ANION GAP: 13 meq/L — AB (ref 3–11)
AST: 14 U/L (ref 5–34)
BILIRUBIN TOTAL: 0.24 mg/dL (ref 0.20–1.20)
BUN: 16.2 mg/dL (ref 7.0–26.0)
CO2: 26 mEq/L (ref 22–29)
Calcium: 9.3 mg/dL (ref 8.4–10.4)
Chloride: 103 mEq/L (ref 98–109)
Creatinine: 0.7 mg/dL (ref 0.6–1.1)
EGFR: 87 mL/min/{1.73_m2} — AB (ref >90)
Glucose: 94 mg/dl (ref 70–140)
POTASSIUM: 3.9 meq/L (ref 3.5–5.1)
Sodium: 141 mEq/L (ref 136–145)
Total Protein: 6 g/dL — ABNORMAL LOW (ref 6.4–8.3)

## 2015-02-17 LAB — TECHNOLOGIST REVIEW

## 2015-02-17 LAB — TSH CHCC: TSH: 2.901 m(IU)/L (ref 0.308–3.960)

## 2015-02-17 MED ORDER — SODIUM CHLORIDE 0.9 % IV SOLN
Freq: Once | INTRAVENOUS | Status: AC
  Administered 2015-02-17: 09:00:00 via INTRAVENOUS

## 2015-02-17 MED ORDER — SODIUM CHLORIDE 0.9 % IV SOLN
3.0000 mg/kg | Freq: Once | INTRAVENOUS | Status: AC
  Administered 2015-02-17: 180 mg via INTRAVENOUS
  Filled 2015-02-17: qty 18

## 2015-02-17 NOTE — Patient Instructions (Signed)
[facility] Discharge Instructions for Patients Receiving Chemotherapy  Today you received the following chemotherapy agents:  Nivolumab.  To help prevent nausea and vomiting after your treatment, we encourage you to take your nausea medication as directed.   If you develop nausea and vomiting that is not controlled by your nausea medication, call the clinic.   BELOW ARE SYMPTOMS THAT SHOULD BE REPORTED IMMEDIATELY:  *FEVER GREATER THAN 100.5 F  *CHILLS WITH OR WITHOUT FEVER  NAUSEA AND VOMITING THAT IS NOT CONTROLLED WITH YOUR NAUSEA MEDICATION  *UNUSUAL SHORTNESS OF BREATH  *UNUSUAL BRUISING OR BLEEDING  TENDERNESS IN MOUTH AND THROAT WITH OR WITHOUT PRESENCE OF ULCERS  *URINARY PROBLEMS  *BOWEL PROBLEMS  UNUSUAL RASH Items with * indicate a potential emergency and should be followed up as soon as possible.  Feel free to call the clinic you have any questions or concerns. The clinic phone number is [phone].  Please show the CHEMO ALERT CARD at check-in to the Emergency Department and triage nurse.   

## 2015-02-17 NOTE — Progress Notes (Addendum)
[place] [phone]   [phone]  OFFICE PROGRESS NOTE  Megan Bolt, MD [address] [place] [address] [place] [address]  DIAGNOSIS: Squamous cell lung cancer  Primary site: Lung (Right)  Staging method: AJCC 7th Edition  Clinical: Stage IIIB (T3, N3, M0)  Summary: Stage IIIB (T3, N3, M0)   PRIOR THERAPY:  1) Concurrent chemoradiation with weekly chemotherapy in the form of carboplatin for AUC of 2 and paclitaxel 45 mg/m2. Status post 7 week of treatment. Last dose was given 01/27/2014 no significant response in her disease.  2) Consolidation chemotherapy with carboplatin for AUC of 5 and paclitaxel 175 mg/M2 every 3 weeks with Neulasta support. First dose on 04/15/2014. Status post 3 cycles. Carboplatin was discontinued starting cycle #2 secondary to hypersensitivity reaction.  CURRENT THERAPY: Immunotherapy with Opdivo (Nivolumab) '3mg'$ /kg given every 2 weeks. Status post 15 cycles.  CHEMOTHERAPY INTENT: Control/Palliative  CURRENT # OF CHEMOTHERAPY CYCLES: 16 CURRENT ANTIEMETICS: Zofran, dexamethasone, Compazine  CURRENT SMOKING STATUS: Former smoker, quit 12/05/1997  ORAL CHEMOTHERAPY AND CONSENT: n/a   CURRENT BISPHOSPHONATES USE: None  PAIN MANAGEMENT: no cancer related pain, patient does have RA  NARCOTICS INDUCED CONSTIPATION: none  LIVING WILL AND CODE STATUS: ?   INTERVAL HISTORY: THAILYN KHALID 74 y.o. female returns to the clinic today for followup visit. She states her blood sugars are now under very good control. She currently takes 6 units of short acting insulin before breakfast lunch and dinner in addition to 20 units of her long-acting insulin before dinner. She also make sure that she eats a snack before bedtime. Overall she is eating better and reports feeling better. She is no longer having diarrhea/loose stool. She voiced no other specific complaints except for the persistent peripheral neuropathy.  She  says that her neuropathy is better with the change in the medication. The patient denied having any significant chest pain but has shortness of breath with exertion with no cough or hemoptysis. She denied having any significant weight loss or night sweats. She has no nausea or vomiting.  She is today to start cycle #16 of her treatment with Nivolumab.  MEDICAL HISTORY: Past Medical History  Diagnosis Date  . Hypertension   . Rheumatoid arthritis   . GERD (gastroesophageal reflux disease)     no meds for  . Hx of radiation therapy 12/16/13-01/30/14    lung 66Gy  . Neuropathy   . Shortness of breath dyspnea     with exertion  . History of hiatal hernia   . Diabetes mellitus     insulin  . Cancer dx'd 09/2013    Lung ca    ALLERGIES:  is allergic to remicade; carboplatin; and hydromorphone.  MEDICATIONS:  Current Outpatient Prescriptions  Medication Sig Dispense Refill  . B-D ULTRAFINE III SHORT PEN 31G X 8 MM MISC   0  . docusate sodium (COLACE) 100 MG capsule Take 1 capsule (100 mg total) by mouth every 12 (twelve) hours. 60 capsule 0  . dronabinol (MARINOL) 10 MG capsule Take 2 capsules (20 mg total) by mouth 2 (two) times daily before a meal. 120 capsule 3  . Fluticasone-Salmeterol (ADVAIR DISKUS) 100-50 MCG/DOSE AEPB Inhale 1 puff into the lungs 2 (two) times daily. 60 each 5  . Gabapentin, Once-Daily, (GRALISE) 600 MG TABS Take 1,800 mg by mouth daily. 90 tablet 6  . HUMALOG KWIKPEN 100 UNIT/ML KiwkPen   0  . ibuprofen (ADVIL,MOTRIN) 600 MG tablet Take 1  tablet (600 mg total) by mouth every 6 (six) hours as needed. Take with food. 30 tablet 0  . LamoTRIgine (LAMICTAL XR) 25 MG TB24 tablet Take 1 tablet (25 mg total) by mouth daily. 30 tablet 3  . lansoprazole (PREVACID SOLUTAB) 30 MG disintegrating tablet Take 1 tablet (30 mg total) by mouth daily. 90 tablet 3  . LEVEMIR FLEXTOUCH 100 UNIT/ML Pen   0  . lidocaine (LIDODERM) 5 % APPLY 3 PATCHES ONTO THE SKIN ONCE DAILY. LEAVE ON  FOR 12 HOURS AND OFF FOR 12 HOURS. MAY CUT PATCH TO FIT 90 patch 3  . losartan-hydrochlorothiazide (HYZAAR) 100-25 MG per tablet Take 1 tablet by mouth daily.    . meloxicam (MOBIC) 15 MG tablet Take 15 mg by mouth daily as needed.     . metFORMIN (GLUCOPHAGE-XR) 500 MG 24 hr tablet Take 500 mg by mouth 4 (four) times daily.  5  . nystatin-triamcinolone (MYCOLOG II) cream Apply 1 application topically as directed.  0  . pantoprazole (PROTONIX) 40 MG tablet Take 1 tablet (40 mg total) by mouth daily. 90 tablet 0  . potassium chloride SA (K-DUR,KLOR-CON) 20 MEQ tablet Take 40 mEq by mouth daily.    . prochlorperazine (COMPAZINE) 10 MG tablet Take 10 mg by mouth every 6 (six) hours as needed for nausea or vomiting.    [name] UNABLE TO FIND Med Name: Metanex- Vitamin B    . zolpidem (AMBIEN) 10 MG tablet Take 10 mg by mouth at bedtime as needed for sleep.    . ciprofloxacin (CIPRO) 250 MG tablet   0  . ONETOUCH DELICA LANCETS FINE MISC   0  . ONETOUCH VERIO test strip   5   No current facility-administered medications for this visit.   Facility-Administered Medications Ordered in Other Visits  Medication Dose Route Frequency Provider Last Rate Last Dose  . sodium chloride 0.9 % injection 10 mL  10 mL Intracatheter PRN Curt Bears, MD   10 mL at 12/30/13 1121    SURGICAL HISTORY:  Past Surgical History  Procedure Laterality Date  . Cholecystectomy    . Abdominal hysterectomy    . Dilation and curettage of uterus    . Abdominal surgery    . Tonsillectomy    . Esophagogastroduodenoscopy (egd) with propofol N/A 10/09/2014    Procedure: ESOPHAGOGASTRODUODENOSCOPY (EGD) WITH PROPOFOL;  Surgeon: Inda Castle, MD;  Location: [facility];  Service: Endoscopy;  Laterality: N/A;  . Savory dilation N/A 10/09/2014    Procedure: SAVORY DILATION;  Surgeon: Inda Castle, MD;  Location: [facility];  Service: Endoscopy;  Laterality: N/A;  . Esophagogastroduodenoscopy N/A 12/12/2014    Procedure:  ESOPHAGOGASTRODUODENOSCOPY (EGD);  Surgeon: Inda Castle, MD;  Location: [facility];  Service: Endoscopy;  Laterality: N/A;  with dilation  . Balloon dilation N/A 12/12/2014    Procedure: BALLOON DILATION;  Surgeon: Inda Castle, MD;  Location: [facility];  Service: Endoscopy;  Laterality: N/A;    REVIEW OF SYSTEMS:  Constitutional: negative Eyes: negative Ears, nose, mouth, throat, and face: negative Respiratory: negative Cardiovascular: negative Gastrointestinal: positive for diarrhea Genitourinary:negative Integument/breast: negative Hematologic/lymphatic: negative Musculoskeletal:negative Neurological: positive for paresthesia Behavioral/Psych: negative Endocrine: negative Allergic/Immunologic: negative   PHYSICAL EXAMINATION: General appearance: alert, cooperative and no distress Head: Normocephalic, without obvious abnormality, atraumatic Neck: no adenopathy, no JVD, supple, symmetrical, trachea midline and thyroid not enlarged, symmetric, no tenderness/mass/nodules Lymph nodes: Cervical, supraclavicular, and axillary nodes normal. Resp: clear to auscultation bilaterally Back: symmetric, no curvature. ROM normal.  No CVA tenderness. Cardio: regular rate and rhythm, S1, S2 normal, no murmur, click, rub or gallop GI: soft, non-tender; bowel sounds normal; no masses,  no organomegaly Extremities: extremities normal, atraumatic, no cyanosis or edema Neurologic: Alert and oriented X 3, normal strength and tone. Normal symmetric reflexes. Normal coordination and gait  ECOG PERFORMANCE STATUS: 1 - Symptomatic but completely ambulatory  Blood pressure 102/55, pulse 54, temperature 97.7 F (36.5 C), temperature source Oral, resp. rate 19, height '5\' 6"'$  (1.676 m), weight 130 lb 9.6 oz (59.24 kg), SpO2 99 %.  LABORATORY DATA: Lab Results  Component Value Date   WBC 5.1 02/17/2015   HGB 12.7 02/17/2015   HCT 38.5 02/17/2015   MCV 90.5 02/17/2015   PLT 189 02/17/2015       Chemistry      Component Value Date/Time   NA 141 02/17/2015 0802   NA 138 10/09/2014 1328   K 3.9 02/17/2015 0802   K 3.7 10/09/2014 1328   CL 92* 09/08/2014 1417   CO2 26 02/17/2015 0802   CO2 28 09/08/2014 1417   BUN 16.2 02/17/2015 0802   BUN 15 09/08/2014 1417   CREATININE 0.7 02/17/2015 0802   CREATININE 0.67 09/08/2014 1417   GLU 299* 02/02/2015 1348      Component Value Date/Time   CALCIUM 9.3 02/17/2015 0802   CALCIUM 9.6 09/08/2014 1417   ALKPHOS 118 02/17/2015 0802   ALKPHOS 118* 09/08/2014 1417   AST 14 02/17/2015 0802   AST 17 09/08/2014 1417   ALT 16 02/17/2015 0802   ALT 25 09/08/2014 1417   BILITOT 0.24 02/17/2015 0802   BILITOT 0.4 09/08/2014 1417       RADIOGRAPHIC STUDIES: EXAM: CT CHEST WITH CONTRAST  TECHNIQUE: Multidetector CT imaging of the chest was performed during intravenous contrast administration.  CONTRAST: 82m OMNIPAQUE IOHEXOL 300 MG/ML SOLN  COMPARISON: 10/22/2014  FINDINGS: Mediastinum/Nodes: The heart is normal in size. No pericardial effusion.  Mild coronary atherosclerosis in the LAD.  Atherosclerotic calcifications of the aortic arch.  8 mm short axis subcarinal node (series 2/ image 25), unchanged. No suspicious hilar or axillary lymphadenopathy.  Diffuse mid/lower esophageal wall thickening (series 2/ image 28), correlate for esophagitis.  Visualized thyroid is unremarkable.  Lungs/Pleura: Radiation changes with right upper lobe collapse.  Underlying 2.0 x 1.2 cm hypoenhancing lesion medially (series 2/image 19), previously 1.9 x 1.1 cm when measured in a similar fashion.  Additional radiation changes in the right lower lobe (series 5/image 21).  Calcified granuloma in the left lower lobe (series 5/ image 42). Visualized left lung is otherwise clear.  Trace right pleural effusion. No pneumothorax.  Upper abdomen: Visualized upper abdomen is notable for a 1.2 x 1.7 cm cyst in the  medial segment left hepatic lobe (series 2/image 55). Splenic granulomata. Cholecystectomy clips.  Musculoskeletal: Degenerative changes of the visualized thoracolumbar spine.  IMPRESSION: Stable radiation changes in the right upper lobe and right lower lobe.  Underlying 2.0 cm lesion medially in the right upper lobe is grossly unchanged and likely reflects post treatment changes. If there is concern for viable tumor, consider PET-CT.  8 mm short axis subcarinal node, unchanged.  Trace right pleural effusion.   Electronically Signed  By: SJulian HyM.D.  On: 12/19/2014 14:49  ASSESSMENT AND PLAN: This is a very pleasant 74year old white female with history of stage IIIB non-small cell lung cancer status post a course of concurrent chemoradiation with weekly carboplatin and paclitaxel status post 7 cycles.  The patient tolerated her treatment fairly well with no significant adverse effects except for the radiation induced esophagitis.  She was also treated with 3 cycles of consolidation chemotherapy with carboplatin and paclitaxel but carboplatin was discontinued the start of cycle #2 secondary to hypersensitivity reaction. Her restaging scan showed evidence for disease progression and the patient is currently undergoing immunotherapy with Nivolumab 3 mg/KG every 2 weeks is status post 15 cycles.  Patient was discussed with and also seen by Dr. Julien Nordmann. Her most last CT scan results showed stable disease. Her serum glucose today is 94.  She will follow-up  in 2 weeks for reevaluation before starting the next dose of her Nivolumab with a restaging Ct scan of the chest with contrast to re-evaluate her disease. She was advised to call immediately if she has any concerning symptoms in the interval. The patient voices understanding of current disease status and treatment options and is in agreement with the current care plan.  All questions were answered. The patient knows  to call the clinic with any problems, questions or concerns. We can certainly see the patient much sooner if necessary.  Carlton Adam, PA-C  02/17/2015    ADDENDUM: Hematology/Oncology Attending: I had a face to face encounter with the patient today. I recommended her care plan. This is a very pleasant 74 years old white female with progressive stage IIIB non-small cell lung cancer currently undergoing treatment with immunotherapy with Nivolumab status post 15 cycles. The patient has been tolerating her treatment fairly well except for hyperglycemia which is currently under control. She was seen recently by her primary care physician adjusted her until diabetic medications. I recommended for the patient to proceed with cycle #16 today as scheduled. She will come back for follow-up visit in 2 weeks for evaluation after repeating CT scan of the chest for restaging of her disease. The patient was advised to call immediately if she has any concerning symptoms in the interval.  Disclaimer: This note was dictated with voice recognition software. Similar sounding words can inadvertently be transcribed and may be missed upon review. Eilleen Kempf., MD 02/17/2015

## 2015-02-17 NOTE — Telephone Encounter (Signed)
Pt confirmed labs/ov per 04/19 POF, gave pt AVS and Calendar..... KJ

## 2015-02-17 NOTE — Patient Instructions (Signed)
Follow-up in 2 weeks with a restaging CT scan of your chest to reevaluate your disease 

## 2015-02-17 NOTE — Telephone Encounter (Signed)
Per staff message and POF I have scheduled appts. Advised scheduler of appts. JMW  

## 2015-02-19 ENCOUNTER — Encounter (HOSPITAL_COMMUNITY): Payer: Medicare Other

## 2015-02-23 ENCOUNTER — Ambulatory Visit: Payer: Medicare Other | Admitting: Internal Medicine

## 2015-02-23 ENCOUNTER — Ambulatory Visit: Payer: Medicare Other | Admitting: Physician Assistant

## 2015-02-23 ENCOUNTER — Other Ambulatory Visit: Payer: Medicare Other

## 2015-02-23 NOTE — Telephone Encounter (Signed)
Let message. She can stop the Lamictal if she is having side effects. Happy to call in a prescription for Mrinol if she wants it, but she doesn't have to take it. We just want to help her neuropathy. If she feels well without medications, that is fine. Or if her pain is worsening, we can try something different. It is all up to her. Asked her to call back and talk to [facility] if needed. thanks

## 2015-02-24 ENCOUNTER — Encounter (HOSPITAL_COMMUNITY): Payer: Medicare Other

## 2015-02-24 ENCOUNTER — Encounter: Payer: Self-pay | Admitting: Podiatry

## 2015-02-26 ENCOUNTER — Encounter (HOSPITAL_COMMUNITY): Payer: Medicare Other

## 2015-03-02 ENCOUNTER — Ambulatory Visit: Payer: Medicare Other | Admitting: Internal Medicine

## 2015-03-02 ENCOUNTER — Other Ambulatory Visit: Payer: Medicare Other

## 2015-03-03 ENCOUNTER — Other Ambulatory Visit (HOSPITAL_BASED_OUTPATIENT_CLINIC_OR_DEPARTMENT_OTHER): Payer: Medicare Other

## 2015-03-03 ENCOUNTER — Ambulatory Visit (HOSPITAL_BASED_OUTPATIENT_CLINIC_OR_DEPARTMENT_OTHER): Payer: Medicare Other | Admitting: Nurse Practitioner

## 2015-03-03 ENCOUNTER — Telehealth: Payer: Self-pay | Admitting: Nurse Practitioner

## 2015-03-03 ENCOUNTER — Encounter (HOSPITAL_COMMUNITY): Payer: Medicare Other

## 2015-03-03 ENCOUNTER — Ambulatory Visit (HOSPITAL_BASED_OUTPATIENT_CLINIC_OR_DEPARTMENT_OTHER): Payer: Medicare Other

## 2015-03-03 VITALS — BP 101/63 | HR 90 | Temp 97.9°F | Resp 18 | Ht 66.0 in | Wt 131.5 lb

## 2015-03-03 DIAGNOSIS — R739 Hyperglycemia, unspecified: Secondary | ICD-10-CM | POA: Diagnosis not present

## 2015-03-03 DIAGNOSIS — C3490 Malignant neoplasm of unspecified part of unspecified bronchus or lung: Secondary | ICD-10-CM

## 2015-03-03 DIAGNOSIS — Z79899 Other long term (current) drug therapy: Secondary | ICD-10-CM

## 2015-03-03 DIAGNOSIS — C342 Malignant neoplasm of middle lobe, bronchus or lung: Secondary | ICD-10-CM

## 2015-03-03 DIAGNOSIS — Z5112 Encounter for antineoplastic immunotherapy: Secondary | ICD-10-CM

## 2015-03-03 DIAGNOSIS — C3491 Malignant neoplasm of unspecified part of right bronchus or lung: Secondary | ICD-10-CM

## 2015-03-03 LAB — COMPREHENSIVE METABOLIC PANEL (CC13)
ALK PHOS: 144 U/L (ref 40–150)
ALT: 15 U/L (ref 0–55)
ANION GAP: 11 meq/L (ref 3–11)
AST: 13 U/L (ref 5–34)
Albumin: 3.4 g/dL — ABNORMAL LOW (ref 3.5–5.0)
BILIRUBIN TOTAL: 0.4 mg/dL (ref 0.20–1.20)
BUN: 12.1 mg/dL (ref 7.0–26.0)
CO2: 28 meq/L (ref 22–29)
Calcium: 9.4 mg/dL (ref 8.4–10.4)
Chloride: 105 mEq/L (ref 98–109)
Creatinine: 0.7 mg/dL (ref 0.6–1.1)
EGFR: 87 mL/min/{1.73_m2} — ABNORMAL LOW (ref >90)
Glucose: 109 mg/dl (ref 70–140)
POTASSIUM: 3.9 meq/L (ref 3.5–5.1)
SODIUM: 144 meq/L (ref 136–145)
TOTAL PROTEIN: 6.1 g/dL — AB (ref 6.4–8.3)

## 2015-03-03 LAB — CBC WITH DIFFERENTIAL/PLATELET
BASO%: 0.8 % (ref 0.0–2.0)
Basophils Absolute: 0.1 10*3/uL (ref 0.0–0.1)
EOS%: 9.6 % — ABNORMAL HIGH (ref 0.0–7.0)
Eosinophils Absolute: 0.6 10*3/uL — ABNORMAL HIGH (ref 0.0–0.5)
HCT: 40.3 % (ref 34.8–46.6)
HGB: 13.4 g/dL (ref 11.6–15.9)
LYMPH%: 13.2 % — ABNORMAL LOW (ref 14.0–49.7)
MCH: 30.8 pg (ref 25.1–34.0)
MCHC: 33.3 g/dL (ref 31.5–36.0)
MCV: 92.6 fL (ref 79.5–101.0)
MONO#: 0.5 10*3/uL (ref 0.1–0.9)
MONO%: 8.3 % (ref 0.0–14.0)
NEUT#: 4.2 10*3/uL (ref 1.5–6.5)
NEUT%: 68.1 % (ref 38.4–76.8)
PLATELETS: 165 10*3/uL (ref 145–400)
RBC: 4.35 10*6/uL (ref 3.70–5.45)
RDW: 14.8 % — ABNORMAL HIGH (ref 11.2–14.5)
WBC: 6.1 10*3/uL (ref 3.9–10.3)
lymph#: 0.8 10*3/uL — ABNORMAL LOW (ref 0.9–3.3)

## 2015-03-03 LAB — TSH CHCC: TSH: 2.722 m(IU)/L (ref 0.308–3.960)

## 2015-03-03 MED ORDER — SODIUM CHLORIDE 0.9 % IV SOLN
Freq: Once | INTRAVENOUS | Status: AC
  Administered 2015-03-03: 11:00:00 via INTRAVENOUS

## 2015-03-03 MED ORDER — SODIUM CHLORIDE 0.9 % IV SOLN
3.0000 mg/kg | Freq: Once | INTRAVENOUS | Status: AC
  Administered 2015-03-03: 180 mg via INTRAVENOUS
  Filled 2015-03-03: qty 18

## 2015-03-03 NOTE — Patient Instructions (Signed)
[facility] Discharge Instructions for Patients Receiving Chemotherapy  Today you received the following chemotherapy agents: Nivolumab  To help prevent nausea and vomiting after your treatment, we encourage you to take your nausea medication: compazine 10 mf every 6 hours as needed.   If you develop nausea and vomiting that is not controlled by your nausea medication, call the clinic.   BELOW ARE SYMPTOMS THAT SHOULD BE REPORTED IMMEDIATELY:  *FEVER GREATER THAN 100.5 F  *CHILLS WITH OR WITHOUT FEVER  NAUSEA AND VOMITING THAT IS NOT CONTROLLED WITH YOUR NAUSEA MEDICATION  *UNUSUAL SHORTNESS OF BREATH  *UNUSUAL BRUISING OR BLEEDING  TENDERNESS IN MOUTH AND THROAT WITH OR WITHOUT PRESENCE OF ULCERS  *URINARY PROBLEMS  *BOWEL PROBLEMS  UNUSUAL RASH Items with * indicate a potential emergency and should be followed up as soon as possible.  Feel free to call the clinic you have any questions or concerns. The clinic phone number is [phone].  Please show the [place] at check-in to the Emergency Department and triage nurse.

## 2015-03-03 NOTE — Telephone Encounter (Signed)
per pof to sch pt appt-[facility] scan-gave pt copy of sch

## 2015-03-03 NOTE — Progress Notes (Signed)
  [place] OFFICE PROGRESS NOTE   DIAGNOSIS: Squamous cell lung cancer  Primary site: Lung (Right)  Staging method: AJCC 7th Edition  Clinical: Stage IIIB (T3, N3, M0)  Summary: Stage IIIB (T3, N3, M0)   PRIOR THERAPY:  1) Concurrent chemoradiation with weekly chemotherapy in the form of carboplatin for AUC of 2 and paclitaxel 45 mg/m2. Status post 7 week of treatment. Last dose was given 01/27/2014 no significant response in her disease.  2) Consolidation chemotherapy with carboplatin for AUC of 5 and paclitaxel 175 mg/M2 every 3 weeks with Neulasta support. First dose on 04/15/2014. Status post 3 cycles. Carboplatin was discontinued starting cycle #2 secondary to hypersensitivity reaction.  CURRENT THERAPY: Immunotherapy with Opdivo (Nivolumab) '3mg'$ /kg given every 2 weeks. Status post 16 cycles.     INTERVAL HISTORY:   Megan [name] returns as scheduled. She continues every 2 week Nivolumab. She denies nausea/vomiting. No mouth sores. No diarrhea. No rash. She has a persistent stable cough which has been present for over 2 years. No hemoptysis. No shortness of breath at rest. She reports a good appetite. She has stable pain related to neuropathy.  Objective:  Vital signs in last 24 hours:  Blood pressure 101/63, pulse 90, temperature 97.9 F (36.6 C), temperature source Oral, resp. rate 18, height '5\' 6"'$  (1.676 m), weight 131 lb 8 oz (59.648 kg), SpO2 99 %.    HEENT: No thrush or ulcers. Lymphatics: No palpable cervical or supraclavicular lymph nodes. Resp: Lungs are clear. Cardio: Regular rate and rhythm. GI: Abdomen soft and nontender. No hepatomegaly. Vascular: No leg edema. Calves soft and nontender. Skin: No rash.    Lab Results:  Lab Results  Component Value Date   WBC 6.1 03/03/2015   HGB 13.4 03/03/2015   HCT 40.3 03/03/2015   MCV 92.6 03/03/2015   PLT 165 03/03/2015   NEUTROABS 4.2 03/03/2015    Imaging:  No results  found.  Medications: I have reviewed the patient's current medications.  Assessment/Plan: 1. Stage IIIB non-small cell lung cancer currently undergoing treatment with immunotherapy with Nivolumab status post 16 cycles. 2. Hyperglycemia, currently under control.   Disposition: Megan [name] appears stable. Plan to proceed with cycle 17 Nivolumab today as scheduled.  Chest CT was ordered to be done on 02/27/2015. This has not been scheduled. We will try to get this scheduled prior to the next treatment in 2 weeks.  She will return for a follow-up visit and cycle 18 nivolumab in 2 weeks. She will contact the office in the interim with any problems.  Plan reviewed with Dr. Julien Nordmann.    Ned Card ANP/GNP-BC   03/03/2015  10:19 AM

## 2015-03-05 ENCOUNTER — Encounter (HOSPITAL_COMMUNITY): Payer: Medicare Other

## 2015-03-06 ENCOUNTER — Telehealth: Payer: Self-pay | Admitting: Physician Assistant

## 2015-03-06 NOTE — Telephone Encounter (Signed)
Pt called to r/s due to work schedule, sent msg to r/s chemo and pt confirmed new D/T .[name].. KJ

## 2015-03-06 NOTE — Telephone Encounter (Signed)
Chemo scheduled per staff message

## 2015-03-09 ENCOUNTER — Ambulatory Visit (HOSPITAL_COMMUNITY)
Admission: RE | Admit: 2015-03-09 | Discharge: 2015-03-09 | Disposition: A | Discharge status: Home / Self Care | Payer: Medicare Other | Source: Ambulatory Visit | Attending: Physician Assistant | Admitting: Physician Assistant

## 2015-03-09 DIAGNOSIS — C3491 Malignant neoplasm of unspecified part of right bronchus or lung: Secondary | ICD-10-CM

## 2015-03-09 DIAGNOSIS — Z08 Encounter for follow-up examination after completed treatment for malignant neoplasm: Secondary | ICD-10-CM | POA: Insufficient documentation

## 2015-03-09 DIAGNOSIS — R911 Solitary pulmonary nodule: Secondary | ICD-10-CM | POA: Diagnosis not present

## 2015-03-09 MED ORDER — IOHEXOL 300 MG/ML  SOLN
80.0000 mL | Freq: Once | INTRAMUSCULAR | Status: AC | PRN
  Administered 2015-03-09: 80 mL via INTRAVENOUS

## 2015-03-10 ENCOUNTER — Encounter (HOSPITAL_COMMUNITY): Payer: Medicare Other

## 2015-03-12 ENCOUNTER — Encounter (HOSPITAL_COMMUNITY): Payer: Medicare Other

## 2015-03-16 ENCOUNTER — Ambulatory Visit: Payer: Medicare Other

## 2015-03-16 ENCOUNTER — Ambulatory Visit: Payer: Medicare Other | Admitting: Physician Assistant

## 2015-03-16 ENCOUNTER — Other Ambulatory Visit: Payer: Medicare Other

## 2015-03-17 ENCOUNTER — Other Ambulatory Visit (HOSPITAL_BASED_OUTPATIENT_CLINIC_OR_DEPARTMENT_OTHER): Payer: Medicare Other

## 2015-03-17 ENCOUNTER — Ambulatory Visit (HOSPITAL_BASED_OUTPATIENT_CLINIC_OR_DEPARTMENT_OTHER): Payer: Medicare Other

## 2015-03-17 ENCOUNTER — Telehealth: Payer: Self-pay | Admitting: *Deleted

## 2015-03-17 ENCOUNTER — Encounter: Payer: Self-pay | Admitting: Physician Assistant

## 2015-03-17 ENCOUNTER — Encounter (HOSPITAL_COMMUNITY): Payer: Medicare Other

## 2015-03-17 ENCOUNTER — Ambulatory Visit: Payer: Medicare Other

## 2015-03-17 ENCOUNTER — Ambulatory Visit (HOSPITAL_BASED_OUTPATIENT_CLINIC_OR_DEPARTMENT_OTHER): Payer: Medicare Other | Admitting: Physician Assistant

## 2015-03-17 ENCOUNTER — Other Ambulatory Visit: Payer: Self-pay | Admitting: Oncology

## 2015-03-17 ENCOUNTER — Telehealth: Payer: Self-pay | Admitting: Physician Assistant

## 2015-03-17 ENCOUNTER — Encounter: Payer: Self-pay | Admitting: Oncology

## 2015-03-17 VITALS — BP 100/62 | HR 94 | Temp 97.8°F | Resp 18

## 2015-03-17 VITALS — BP 112/62 | HR 91 | Temp 97.8°F | Resp 17 | Ht 66.0 in | Wt 133.5 lb

## 2015-03-17 DIAGNOSIS — Z79899 Other long term (current) drug therapy: Secondary | ICD-10-CM | POA: Diagnosis not present

## 2015-03-17 DIAGNOSIS — C3490 Malignant neoplasm of unspecified part of unspecified bronchus or lung: Secondary | ICD-10-CM

## 2015-03-17 DIAGNOSIS — C342 Malignant neoplasm of middle lobe, bronchus or lung: Secondary | ICD-10-CM

## 2015-03-17 DIAGNOSIS — C3491 Malignant neoplasm of unspecified part of right bronchus or lung: Secondary | ICD-10-CM

## 2015-03-17 DIAGNOSIS — R911 Solitary pulmonary nodule: Secondary | ICD-10-CM | POA: Diagnosis not present

## 2015-03-17 DIAGNOSIS — G620 Drug-induced polyneuropathy: Secondary | ICD-10-CM

## 2015-03-17 DIAGNOSIS — R5383 Other fatigue: Secondary | ICD-10-CM

## 2015-03-17 DIAGNOSIS — Z5112 Encounter for antineoplastic immunotherapy: Secondary | ICD-10-CM

## 2015-03-17 DIAGNOSIS — T451X5A Adverse effect of antineoplastic and immunosuppressive drugs, initial encounter: Secondary | ICD-10-CM

## 2015-03-17 LAB — CBC WITH DIFFERENTIAL/PLATELET
BASO%: 0.5 % (ref 0.0–2.0)
Basophils Absolute: 0 10*3/uL (ref 0.0–0.1)
EOS ABS: 1.2 10*3/uL — AB (ref 0.0–0.5)
EOS%: 14.9 % — ABNORMAL HIGH (ref 0.0–7.0)
HEMATOCRIT: 39.9 % (ref 34.8–46.6)
HGB: 13.5 g/dL (ref 11.6–15.9)
LYMPH%: 12.2 % — ABNORMAL LOW (ref 14.0–49.7)
MCH: 30.8 pg (ref 25.1–34.0)
MCHC: 33.8 g/dL (ref 31.5–36.0)
MCV: 91.1 fL (ref 79.5–101.0)
MONO#: 0.6 10*3/uL (ref 0.1–0.9)
MONO%: 7.2 % (ref 0.0–14.0)
NEUT%: 65.2 % (ref 38.4–76.8)
NEUTROS ABS: 5.1 10*3/uL (ref 1.5–6.5)
PLATELETS: 176 10*3/uL (ref 145–400)
RBC: 4.38 10*6/uL (ref 3.70–5.45)
RDW: 14.4 % (ref 11.2–14.5)
WBC: 7.8 10*3/uL (ref 3.9–10.3)
lymph#: 1 10*3/uL (ref 0.9–3.3)

## 2015-03-17 LAB — COMPREHENSIVE METABOLIC PANEL (CC13)
ALK PHOS: 137 U/L (ref 40–150)
ALT: 19 U/L (ref 0–55)
AST: 18 U/L (ref 5–34)
Albumin: 3.4 g/dL — ABNORMAL LOW (ref 3.5–5.0)
Anion Gap: 10 mEq/L (ref 3–11)
BUN: 16.7 mg/dL (ref 7.0–26.0)
CO2: 26 mEq/L (ref 22–29)
CREATININE: 0.8 mg/dL (ref 0.6–1.1)
Calcium: 9.1 mg/dL (ref 8.4–10.4)
Chloride: 105 mEq/L (ref 98–109)
EGFR: 76 mL/min/{1.73_m2} — ABNORMAL LOW (ref >90)
Glucose: 223 mg/dl — ABNORMAL HIGH (ref 70–140)
Potassium: 3.6 mEq/L (ref 3.5–5.1)
Sodium: 140 mEq/L (ref 136–145)
Total Bilirubin: 0.49 mg/dL (ref 0.20–1.20)
Total Protein: 6.1 g/dL — ABNORMAL LOW (ref 6.4–8.3)

## 2015-03-17 LAB — TSH CHCC: TSH: 2.773 m[IU]/L (ref 0.308–3.960)

## 2015-03-17 MED ORDER — SODIUM CHLORIDE 0.9 % IV SOLN: Freq: Once | INTRAVENOUS | Status: DC

## 2015-03-17 MED ORDER — SODIUM CHLORIDE 0.9 % IV SOLN
3.0000 mg/kg | Freq: Once | INTRAVENOUS | Status: AC
  Administered 2015-03-17: 180 mg via INTRAVENOUS
  Filled 2015-03-17: qty 18

## 2015-03-17 NOTE — Progress Notes (Signed)
Megan [name] [phone]   Fax:([phone]) [phone]  OFFICE PROGRESS NOTE  [name], MD [address] [place] [address] [place] [address]  DIAGNOSIS: Squamous cell lung cancer  Primary site: Lung (Right)  Staging method: AJCC 7th Edition  Clinical: Stage IIIB (T3, N3, M0)  Summary: Stage IIIB (T3, N3, M0)   PRIOR THERAPY:  1) Concurrent chemoradiation with weekly chemotherapy in the form of carboplatin for AUC of 2 and paclitaxel 45 mg/m2. Status post 7 week of treatment. Last dose was given 01/27/2014 no significant response in her disease.  2) Consolidation chemotherapy with carboplatin for AUC of 5 and paclitaxel 175 mg/M2 every 3 weeks with Neulasta support. First dose on 04/15/2014. Status post 3 cycles. Carboplatin was discontinued starting cycle #2 secondary to hypersensitivity reaction.  CURRENT THERAPY: Immunotherapy with Opdivo (Nivolumab) '3mg'$ /kg given every 2 weeks. Status post 17 cycles.  CHEMOTHERAPY INTENT: Control/Palliative  CURRENT # OF CHEMOTHERAPY CYCLES: 18 CURRENT ANTIEMETICS: Zofran, dexamethasone, Compazine  CURRENT SMOKING STATUS: Former smoker, quit 12/05/1997  ORAL CHEMOTHERAPY AND CONSENT: n/a   CURRENT BISPHOSPHONATES USE: None  PAIN MANAGEMENT: no cancer related pain, patient does have RA  NARCOTICS INDUCED CONSTIPATION: none  LIVING WILL AND CODE STATUS: ?   INTERVAL HISTORY: Megan [name] 74 y.o. female returns to the clinic today for followup visit. She states her blood sugars continue to be under very good control. Overall she is feeling quite well.. She denies any episodes of diarrhea. She voiced no other specific complaints except for the persistent peripheral neuropathy.  She says that her neuropathy is better with the change in the medication. The patient denied having any significant chest pain but has shortness of breath with exertion with no cough or hemoptysis. She denied skin rash. She denied having  any significant weight loss or night sweats. She has no nausea or vomiting.  She denied any skin rash. She is today to start cycle #18 of her treatment with Nivolumab. She recently had a restaging CT scan of the chest with contrast and presents to discuss the results.  MEDICAL HISTORY: Past Medical History  Diagnosis Date  . Hypertension   . Rheumatoid arthritis   . GERD (gastroesophageal reflux disease)     no meds for  . Hx of radiation therapy 12/16/13-01/30/14    lung 66Gy  . Neuropathy   . Shortness of breath dyspnea     with exertion  . History of hiatal hernia   . Diabetes mellitus     insulin  . Cancer dx'd 09/2013    Lung ca    ALLERGIES:  is allergic to remicade; carboplatin; and hydromorphone.  MEDICATIONS:  Current Outpatient Prescriptions  Medication Sig Dispense Refill  . B-D ULTRAFINE III SHORT PEN 31G X 8 MM MISC   0  . docusate sodium (COLACE) 100 MG capsule Take 1 capsule (100 mg total) by mouth every 12 (twelve) hours. 60 capsule 0  . dronabinol (MARINOL) 10 MG capsule Take 2 capsules (20 mg total) by mouth 2 (two) times daily before a meal. 120 capsule 3  . Fluticasone-Salmeterol (ADVAIR DISKUS) 100-50 MCG/DOSE AEPB Inhale 1 puff into the lungs 2 (two) times daily. 60 each 5  . Gabapentin, Once-Daily, (GRALISE) 600 MG TABS Take 1,800 mg by mouth daily. 90 tablet 6  . HUMALOG KWIKPEN 100 UNIT/ML KiwkPen   0  . ibuprofen (ADVIL,MOTRIN) 600 MG tablet Take 1 tablet (600 mg total) by mouth every 6 (six) hours as needed.  Take with food. 30 tablet 0  . LamoTRIgine (LAMICTAL XR) 25 MG TB24 tablet Take 1 tablet (25 mg total) by mouth daily. 30 tablet 3  . LEVEMIR FLEXTOUCH 100 UNIT/ML Pen   0  . lidocaine (LIDODERM) 5 % APPLY 3 PATCHES ONTO THE SKIN ONCE DAILY. LEAVE ON FOR 12 HOURS AND OFF FOR 12 HOURS. MAY CUT PATCH TO FIT 90 patch 3  . losartan-hydrochlorothiazide (HYZAAR) 100-25 MG per tablet Take 1 tablet by mouth daily.    . meloxicam (MOBIC) 15 MG tablet Take 15 mg  by mouth daily as needed.     . nystatin-triamcinolone (MYCOLOG II) cream Apply 1 application topically as directed.  0  . ONETOUCH DELICA LANCETS FINE MISC   0  . ONETOUCH VERIO test strip   5  . pantoprazole (PROTONIX) 40 MG tablet Take 1 tablet (40 mg total) by mouth daily. 90 tablet 0  . potassium chloride SA (K-DUR,KLOR-CON) 20 MEQ tablet Take 40 mEq by mouth daily.    . prochlorperazine (COMPAZINE) 10 MG tablet Take 10 mg by mouth every 6 (six) hours as needed for nausea or vomiting.    [name] UNABLE TO FIND Med Name: Metanex- Vitamin B    . zolpidem (AMBIEN) 10 MG tablet Take 10 mg by mouth at bedtime as needed for sleep.    . lansoprazole (PREVACID SOLUTAB) 30 MG disintegrating tablet Take 1 tablet (30 mg total) by mouth daily. (Patient not taking: Reported on 03/17/2015) 90 tablet 3   No current facility-administered medications for this visit.   Facility-Administered Medications Ordered in Other Visits  Medication Dose Route Frequency Provider Last Rate Last Dose  . 0.9 %  sodium chloride infusion   Intravenous Once Megan Cullop E Mays Paino, PA-C      . nivolumab (OPDIVO) 180 mg in sodium chloride 0.9 % 100 mL chemo infusion  3 mg/kg (Order-Specific) Intravenous Once Carlton Adam, PA-C 118 mL/hr at 03/17/15 1620 180 mg at 03/17/15 1620  . sodium chloride 0.9 % injection 10 mL  10 mL Intracatheter PRN Curt Bears, MD   10 mL at 12/30/13 1121    SURGICAL HISTORY:  Past Surgical History  Procedure Laterality Date  . Cholecystectomy    . Abdominal hysterectomy    . Dilation and curettage of uterus    . Abdominal surgery    . Tonsillectomy    . Esophagogastroduodenoscopy (egd) with propofol N/A 10/09/2014    Procedure: ESOPHAGOGASTRODUODENOSCOPY (EGD) WITH PROPOFOL;  Surgeon: Inda Castle, MD;  Location: [facility];  Service: Endoscopy;  Laterality: N/A;  . Savory dilation N/A 10/09/2014    Procedure: SAVORY DILATION;  Surgeon: Inda Castle, MD;  Location: [facility];   Service: Endoscopy;  Laterality: N/A;  . Esophagogastroduodenoscopy N/A 12/12/2014    Procedure: ESOPHAGOGASTRODUODENOSCOPY (EGD);  Surgeon: Inda Castle, MD;  Location: [facility];  Service: Endoscopy;  Laterality: N/A;  with dilation  . Balloon dilation N/A 12/12/2014    Procedure: BALLOON DILATION;  Surgeon: Inda Castle, MD;  Location: [facility];  Service: Endoscopy;  Laterality: N/A;    REVIEW OF SYSTEMS:  Constitutional: negative Eyes: negative Ears, nose, mouth, throat, and face: negative Respiratory: negative Cardiovascular: negative Gastrointestinal: positive for diarrhea Genitourinary:negative Integument/breast: negative Hematologic/lymphatic: negative Musculoskeletal:negative Neurological: positive for paresthesia Behavioral/Psych: negative Endocrine: negative Allergic/Immunologic: negative   PHYSICAL EXAMINATION: General appearance: alert, cooperative and no distress Head: Normocephalic, without obvious abnormality, atraumatic Neck: no adenopathy, no JVD, supple, symmetrical, trachea midline and thyroid not enlarged, symmetric, no tenderness/mass/nodules  Lymph nodes: Cervical, supraclavicular, and axillary nodes normal. Resp: clear to auscultation bilaterally Back: symmetric, no curvature. ROM normal. No CVA tenderness. Cardio: regular rate and rhythm, S1, S2 normal, no murmur, click, rub or gallop GI: soft, non-tender; bowel sounds normal; no masses,  no organomegaly Extremities: extremities normal, atraumatic, no cyanosis or edema Neurologic: Alert and oriented X 3, normal strength and tone. Normal symmetric reflexes. Normal coordination and gait  ECOG PERFORMANCE STATUS: 1 - Symptomatic but completely ambulatory  Blood pressure 112/62, pulse 91, temperature 97.8 F (36.6 C), temperature source Oral, resp. rate 17, height '5\' 6"'$  (1.676 m), weight 133 lb 8 oz (60.555 kg), SpO2 98 %.  LABORATORY DATA: Lab Results  Component Value Date   WBC 7.8 03/17/2015     HGB 13.5 03/17/2015   HCT 39.9 03/17/2015   MCV 91.1 03/17/2015   PLT 176 03/17/2015      Chemistry      Component Value Date/Time   NA 140 03/17/2015 1348   NA 138 10/09/2014 1328   K 3.6 03/17/2015 1348   K 3.7 10/09/2014 1328   CL 92* 09/08/2014 1417   CO2 26 03/17/2015 1348   CO2 28 09/08/2014 1417   BUN 16.7 03/17/2015 1348   BUN 15 09/08/2014 1417   CREATININE 0.8 03/17/2015 1348   CREATININE 0.67 09/08/2014 1417   GLU 299* 02/02/2015 1348      Component Value Date/Time   CALCIUM 9.1 03/17/2015 1348   CALCIUM 9.6 09/08/2014 1417   ALKPHOS 137 03/17/2015 1348   ALKPHOS 118* 09/08/2014 1417   AST 18 03/17/2015 1348   AST 17 09/08/2014 1417   ALT 19 03/17/2015 1348   ALT 25 09/08/2014 1417   BILITOT 0.49 03/17/2015 1348   BILITOT 0.4 09/08/2014 1417       RADIOGRAPHIC STUDIES: Ct Chest W Contrast  03/09/2015   CLINICAL DATA:  Fall lung cancer diagnosed December 2014. Previous radiation therapy ongoing maintenance chemotherapy. Subsequent treatment evaluation.  EXAM: CT CHEST WITH CONTRAST  TECHNIQUE: Multidetector CT imaging of the chest was performed during intravenous contrast administration.  CONTRAST:  61m OMNIPAQUE IOHEXOL 300 MG/ML  SOLN  COMPARISON:  CT 12/19/2014  FINDINGS: Mediastinum/Nodes: No axillary supraclavicular lymphadenopathy. No mediastinal hilar lymphadenopathy. There is mild thickening through the distal esophagus. Central pulmonary embolism.  Lungs/Pleura: There is atelectasis in the medial right upper lobe. Region of low attenuation within the atelectatic lung measures 16 mm compared to 20 mm on prior. There is perihilar bandlike atelectasis and mild ground-glass opacity which is not changed from prior consistent with radiation postsurgical change. No new nodularity.  Within the left lower lobe there is a calcified peripheral nodule on image 42 which is not changed from prior. Slightly superior in the left lower lobe there is a round 6 mm nodule  (image 39, series 5 which is not present on comparison exam. Pint  Upper abdomen: Normal adrenal glands left hepatic lobe cysts noted.  Musculoskeletal: No aggressive osseous lesion.  IMPRESSION: 1. Stable postsurgical and postradiation change in the right hemi thorax. 2. New left lower lobe pulmonary nodule measuring 6 mm is concerning for a metastatic lesion. Consider short-term follow-up in 3 to 4 weeks to exclude a small focus of infection and if lesion persists recommend biopsy.   Electronically Signed   By: SSuzy BouchardM.D.   On: 03/09/2015 14:44     ASSESSMENT AND PLAN: This is a very pleasant 74year old white female with history of stage IIIB non-small  cell lung cancer status post a course of concurrent chemoradiation with weekly carboplatin and paclitaxel status post 7 cycles. The patient tolerated her treatment fairly well with no significant adverse effects except for the radiation induced esophagitis.  She was also treated with 3 cycles of consolidation chemotherapy with carboplatin and paclitaxel but carboplatin was discontinued the start of cycle #2 secondary to hypersensitivity reaction. Her restaging scan showed evidence for disease progression and the patient is currently undergoing immunotherapy with Nivolumab 3 mg/KG every 2 weeks is status post 17 cycles.   Her recent CT scan results showed relatively stable disease however, his base there is a new left lower lobe pulmonary nodule measuring 6 mm that was concerning for metastatic disease although it could also represent a small focus of infection. The radiologist, Dr. Leonia Reeves recommended short-term follow-up in 3-4 weeks and if lesion persists recommended a biopsy. The patient was reviewed with Dr. Alen Blew and Dr. Worthy Flank absence. She will continue at this point with her immunotherapy with Nivolumab as scheduled today. The CT scan results were discussed with Megan [name] today.   She will follow-up  in 2 weeks for reevaluation  before starting the next dose of her Nivolumab.   She was advised to call immediately if she has any concerning symptoms in the interval. The patient voices understanding of current disease status and treatment options and is in agreement with the current care plan.  All questions were answered. The patient knows to call the clinic with any problems, questions or concerns. We can certainly see the patient much sooner if necessary.  Carlton Adam, PA-C  03/17/2015

## 2015-03-17 NOTE — Telephone Encounter (Signed)
Per staff message and POF I have scheduled appts. Advised scheduler of appts and no available on 5/31 moved to 6/1 JMW

## 2015-03-17 NOTE — Patient Instructions (Signed)
Your recent restaging CT scan showed relatively stable disease with the exception of a new small (6 mm) left lower lobe nodule. We will closely monitor this area on subsequent CT scans Follow-up in 2 weeks prior to your next scheduled cycle of immunotherapy

## 2015-03-17 NOTE — Telephone Encounter (Signed)
Pt confirmed labs/ov per 05/17 POF, gave pt AVS and Calendar.... KJ, sent msg to add chemo

## 2015-03-19 ENCOUNTER — Encounter (HOSPITAL_COMMUNITY): Payer: Medicare Other

## 2015-03-20 ENCOUNTER — Telehealth: Payer: Self-pay | Admitting: Internal Medicine

## 2015-03-20 ENCOUNTER — Telehealth: Payer: Self-pay | Admitting: Neurology

## 2015-03-20 NOTE — Telephone Encounter (Signed)
Pt called and requested to speak with someone regarding medication zolpidem (AMBIEN) 10 MG tablet. She stated that her insurance would only cover a 90 day Rx one time a year. Please call and advise.

## 2015-03-20 NOTE — Telephone Encounter (Signed)
I called back.  Patient gets this med from another provider and will contact them instead.  She will call us back if anything further is needed from our office.

## 2015-03-20 NOTE — Telephone Encounter (Signed)
Adjusted time of 5/31 lab/fu. Spoke with patient she is aware. Confirmed appointments for 5/31 lab/AJ 10:45am and 6/1 inf @ 10am.

## 2015-03-24 ENCOUNTER — Encounter (HOSPITAL_COMMUNITY): Payer: Medicare Other

## 2015-03-26 ENCOUNTER — Encounter (HOSPITAL_COMMUNITY): Payer: Medicare Other

## 2015-03-31 ENCOUNTER — Ambulatory Visit (HOSPITAL_BASED_OUTPATIENT_CLINIC_OR_DEPARTMENT_OTHER): Payer: Medicare Other | Admitting: Physician Assistant

## 2015-03-31 ENCOUNTER — Encounter (HOSPITAL_COMMUNITY): Payer: Medicare Other

## 2015-03-31 ENCOUNTER — Other Ambulatory Visit (HOSPITAL_BASED_OUTPATIENT_CLINIC_OR_DEPARTMENT_OTHER): Payer: Medicare Other

## 2015-03-31 ENCOUNTER — Other Ambulatory Visit: Payer: Self-pay | Admitting: Lab

## 2015-03-31 ENCOUNTER — Encounter: Payer: Self-pay | Admitting: Physician Assistant

## 2015-03-31 VITALS — BP 133/65 | HR 91 | Temp 98.0°F | Resp 18 | Ht 66.0 in | Wt 134.6 lb

## 2015-03-31 DIAGNOSIS — C3491 Malignant neoplasm of unspecified part of right bronchus or lung: Secondary | ICD-10-CM

## 2015-03-31 DIAGNOSIS — Z79899 Other long term (current) drug therapy: Secondary | ICD-10-CM | POA: Diagnosis not present

## 2015-03-31 DIAGNOSIS — C342 Malignant neoplasm of middle lobe, bronchus or lung: Secondary | ICD-10-CM

## 2015-03-31 DIAGNOSIS — E1165 Type 2 diabetes mellitus with hyperglycemia: Secondary | ICD-10-CM | POA: Diagnosis not present

## 2015-03-31 DIAGNOSIS — Z5112 Encounter for antineoplastic immunotherapy: Secondary | ICD-10-CM

## 2015-03-31 DIAGNOSIS — M069 Rheumatoid arthritis, unspecified: Secondary | ICD-10-CM

## 2015-03-31 LAB — CBC WITH DIFFERENTIAL/PLATELET
BASO%: 0.7 % (ref 0.0–2.0)
BASOS ABS: 0.1 10*3/uL (ref 0.0–0.1)
EOS%: 24.5 % — ABNORMAL HIGH (ref 0.0–7.0)
Eosinophils Absolute: 2.5 10*3/uL — ABNORMAL HIGH (ref 0.0–0.5)
HEMATOCRIT: 41.7 % (ref 34.8–46.6)
HGB: 14.2 g/dL (ref 11.6–15.9)
LYMPH%: 17.6 % (ref 14.0–49.7)
MCH: 30.9 pg (ref 25.1–34.0)
MCHC: 34.1 g/dL (ref 31.5–36.0)
MCV: 90.7 fL (ref 79.5–101.0)
MONO#: 0.8 10*3/uL (ref 0.1–0.9)
MONO%: 8.1 % (ref 0.0–14.0)
NEUT#: 5 10*3/uL (ref 1.5–6.5)
NEUT%: 49.1 % (ref 38.4–76.8)
PLATELETS: 207 10*3/uL (ref 145–400)
RBC: 4.6 10*6/uL (ref 3.70–5.45)
RDW: 14.3 % (ref 11.2–14.5)
WBC: 10.1 10*3/uL (ref 3.9–10.3)
lymph#: 1.8 10*3/uL (ref 0.9–3.3)

## 2015-03-31 LAB — COMPREHENSIVE METABOLIC PANEL (CC13)
ALT: 14 U/L (ref 0–55)
AST: 18 U/L (ref 5–34)
Albumin: 3.7 g/dL (ref 3.5–5.0)
Alkaline Phosphatase: 121 U/L (ref 40–150)
Anion Gap: 12 mEq/L — ABNORMAL HIGH (ref 3–11)
BILIRUBIN TOTAL: 0.75 mg/dL (ref 0.20–1.20)
BUN: 16.5 mg/dL (ref 7.0–26.0)
CO2: 26 mEq/L (ref 22–29)
Calcium: 9.5 mg/dL (ref 8.4–10.4)
Chloride: 105 mEq/L (ref 98–109)
Creatinine: 0.8 mg/dL (ref 0.6–1.1)
EGFR: 75 mL/min/{1.73_m2} — ABNORMAL LOW (ref >90)
Glucose: 68 mg/dl — ABNORMAL LOW (ref 70–140)
Potassium: 3.6 mEq/L (ref 3.5–5.1)
Sodium: 143 mEq/L (ref 136–145)
Total Protein: 6.7 g/dL (ref 6.4–8.3)

## 2015-03-31 LAB — TSH CHCC: TSH: 3.254 m(IU)/L (ref 0.308–3.960)

## 2015-03-31 NOTE — Progress Notes (Addendum)
[place] [phone]   [phone]  OFFICE PROGRESS NOTE  [name], MD [address] [facility] [address] [place] [address]  DIAGNOSIS: Squamous cell lung cancer  Primary site: Lung (Right)  Staging method: AJCC 7th Edition  Clinical: Stage IIIB (T3, N3, M0)  Summary: Stage IIIB (T3, N3, M0)   PRIOR THERAPY:  1) Concurrent chemoradiation with weekly chemotherapy in the form of carboplatin for AUC of 2 and paclitaxel 45 mg/m2. Status post 7 week of treatment. Last dose was given 01/27/2014 no significant response in her disease.  2) Consolidation chemotherapy with carboplatin for AUC of 5 and paclitaxel 175 mg/M2 every 3 weeks with Neulasta support. First dose on 04/15/2014. Status post 3 cycles. Carboplatin was discontinued starting cycle #2 secondary to hypersensitivity reaction.  CURRENT THERAPY: Immunotherapy with Opdivo (Nivolumab) '3mg'$ /kg given every 2 weeks. Status post 18 cycles.  CHEMOTHERAPY INTENT: Control/Palliative  CURRENT # OF CHEMOTHERAPY CYCLES: 19 CURRENT ANTIEMETICS: Zofran, dexamethasone, Compazine  CURRENT SMOKING STATUS: Former smoker, quit 12/05/1997  ORAL CHEMOTHERAPY AND CONSENT: n/a   CURRENT BISPHOSPHONATES USE: None  PAIN MANAGEMENT: no cancer related pain, patient does have RA  NARCOTICS INDUCED CONSTIPATION: none  LIVING WILL AND CODE STATUS: ?   INTERVAL HISTORY: Megan [name] 74 y.o. female returns to the clinic today for followup visit. She has not decided of low blood sugar while awaiting the her labs drawn this morning. She ate some raisins and felt better. Overall she is otherwise feeling quite well.. She denies any episodes of diarrhea. She voiced no other specific complaints except for the persistent peripheral neuropathy.  She says that her neuropathy is better with the change in the medication. The patient denied having any significant chest pain but has shortness of breath with exertion  with no cough or hemoptysis. She denied skin rash. She denied having any significant weight loss or night sweats. She has no nausea or vomiting.  She denied any skin rash. She is today to start cycle #19 of her treatment with Nivolumab.   MEDICAL HISTORY: Past Medical History  Diagnosis Date  . Hypertension   . Rheumatoid arthritis   . GERD (gastroesophageal reflux disease)     no meds for  . Hx of radiation therapy 12/16/13-01/30/14    lung 66Gy  . Neuropathy   . Shortness of breath dyspnea     with exertion  . History of hiatal hernia   . Diabetes mellitus     insulin  . Cancer dx'd 09/2013    Lung ca    ALLERGIES:  is allergic to remicade; carboplatin; and hydromorphone.  MEDICATIONS:  Current Outpatient Prescriptions  Medication Sig Dispense Refill  . B-D ULTRAFINE III SHORT PEN 31G X 8 MM MISC   0  . docusate sodium (COLACE) 100 MG capsule Take 1 capsule (100 mg total) by mouth every 12 (twelve) hours. 60 capsule 0  . dronabinol (MARINOL) 10 MG capsule Take 2 capsules (20 mg total) by mouth 2 (two) times daily before a meal. 120 capsule 3  . Fluticasone-Salmeterol (ADVAIR DISKUS) 100-50 MCG/DOSE AEPB Inhale 1 puff into the lungs 2 (two) times daily. 60 each 5  . Gabapentin, Once-Daily, (GRALISE) 600 MG TABS Take 1,800 mg by mouth daily. 90 tablet 6  . HUMALOG KWIKPEN 100 UNIT/ML KiwkPen   0  . ibuprofen (ADVIL,MOTRIN) 600 MG tablet Take 1 tablet (600 mg total) by mouth every 6 (six) hours as needed. Take with food. 30 tablet  0  . LamoTRIgine (LAMICTAL XR) 25 MG TB24 tablet Take 1 tablet (25 mg total) by mouth daily. 30 tablet 3  . lansoprazole (PREVACID SOLUTAB) 30 MG disintegrating tablet Take 1 tablet (30 mg total) by mouth daily. (Patient not taking: Reported on 03/17/2015) 90 tablet 3  . LEVEMIR FLEXTOUCH 100 UNIT/ML Pen   0  . lidocaine (LIDODERM) 5 % APPLY 3 PATCHES ONTO THE SKIN ONCE DAILY. LEAVE ON FOR 12 HOURS AND OFF FOR 12 HOURS. MAY CUT PATCH TO FIT 90 patch 3  .  losartan-hydrochlorothiazide (HYZAAR) 100-25 MG per tablet Take 1 tablet by mouth daily.    . meloxicam (MOBIC) 15 MG tablet Take 15 mg by mouth daily as needed.     . nystatin-triamcinolone (MYCOLOG II) cream Apply 1 application topically as directed.  0  . ONETOUCH DELICA LANCETS FINE MISC   0  . ONETOUCH VERIO test strip   5  . pantoprazole (PROTONIX) 40 MG tablet Take 1 tablet (40 mg total) by mouth daily. 90 tablet 0  . potassium chloride SA (K-DUR,KLOR-CON) 20 MEQ tablet Take 40 mEq by mouth daily.    . prochlorperazine (COMPAZINE) 10 MG tablet Take 10 mg by mouth every 6 (six) hours as needed for nausea or vomiting.    [name] UNABLE TO FIND Med Name: Metanex- Vitamin B    . zolpidem (AMBIEN) 10 MG tablet Take 10 mg by mouth at bedtime as needed for sleep.     No current facility-administered medications for this visit.   Facility-Administered Medications Ordered in Other Visits  Medication Dose Route Frequency Provider Last Rate Last Dose  . sodium chloride 0.9 % injection 10 mL  10 mL Intracatheter PRN Curt Bears, MD   10 mL at 12/30/13 1121    SURGICAL HISTORY:  Past Surgical History  Procedure Laterality Date  . Cholecystectomy    . Abdominal hysterectomy    . Dilation and curettage of uterus    . Abdominal surgery    . Tonsillectomy    . Esophagogastroduodenoscopy (egd) with propofol N/A 10/09/2014    Procedure: ESOPHAGOGASTRODUODENOSCOPY (EGD) WITH PROPOFOL;  Surgeon: Inda Castle, MD;  Location: [facility];  Service: Endoscopy;  Laterality: N/A;  . Savory dilation N/A 10/09/2014    Procedure: SAVORY DILATION;  Surgeon: Inda Castle, MD;  Location: [facility];  Service: Endoscopy;  Laterality: N/A;  . Esophagogastroduodenoscopy N/A 12/12/2014    Procedure: ESOPHAGOGASTRODUODENOSCOPY (EGD);  Surgeon: Inda Castle, MD;  Location: [facility];  Service: Endoscopy;  Laterality: N/A;  with dilation  . Balloon dilation N/A 12/12/2014    Procedure: BALLOON DILATION;   Surgeon: Inda Castle, MD;  Location: [facility];  Service: Endoscopy;  Laterality: N/A;    REVIEW OF SYSTEMS:  Constitutional: positive for fatigue Eyes: negative Ears, nose, mouth, throat, and face: negative Respiratory: negative Cardiovascular: negative Gastrointestinal: negative Genitourinary:negative Integument/breast: negative Hematologic/lymphatic: negative Musculoskeletal:negative Neurological: positive for paresthesia Behavioral/Psych: negative Endocrine: negative Allergic/Immunologic: negative   PHYSICAL EXAMINATION: General appearance: alert, cooperative and no distress Head: Normocephalic, without obvious abnormality, atraumatic Neck: no adenopathy, no JVD, supple, symmetrical, trachea midline and thyroid not enlarged, symmetric, no tenderness/mass/nodules Lymph nodes: Cervical, supraclavicular, and axillary nodes normal. Resp: clear to auscultation bilaterally Back: symmetric, no curvature. ROM normal. No CVA tenderness. Cardio: regular rate and rhythm, S1, S2 normal, no murmur, click, rub or gallop GI: soft, non-tender; bowel sounds normal; no masses,  no organomegaly Extremities: extremities normal, atraumatic, no cyanosis or edema Neurologic: Alert and oriented X  3, normal strength and tone. Normal symmetric reflexes. Normal coordination and gait  ECOG PERFORMANCE STATUS: 1 - Symptomatic but completely ambulatory  Blood pressure 133/65, pulse 91, temperature 98 F (36.7 C), temperature source Oral, resp. rate 18, height '5\' 6"'$  (1.676 m), weight 134 lb 9.6 oz (61.054 kg), SpO2 100 %.  LABORATORY DATA: Lab Results  Component Value Date   WBC 10.1 03/31/2015   HGB 14.2 03/31/2015   HCT 41.7 03/31/2015   MCV 90.7 03/31/2015   PLT 207 03/31/2015      Chemistry      Component Value Date/Time   NA 143 03/31/2015 1048   NA 138 10/09/2014 1328   K 3.6 03/31/2015 1048   K 3.7 10/09/2014 1328   CL 92* 09/08/2014 1417   CO2 26 03/31/2015 1048   CO2 28  09/08/2014 1417   BUN 16.5 03/31/2015 1048   BUN 15 09/08/2014 1417   CREATININE 0.8 03/31/2015 1048   CREATININE 0.67 09/08/2014 1417   GLU 299* 02/02/2015 1348      Component Value Date/Time   CALCIUM 9.5 03/31/2015 1048   CALCIUM 9.6 09/08/2014 1417   ALKPHOS 121 03/31/2015 1048   ALKPHOS 118* 09/08/2014 1417   AST 18 03/31/2015 1048   AST 17 09/08/2014 1417   ALT 14 03/31/2015 1048   ALT 25 09/08/2014 1417   BILITOT 0.75 03/31/2015 1048   BILITOT 0.4 09/08/2014 1417       RADIOGRAPHIC STUDIES: Ct Chest W Contrast  03/09/2015   CLINICAL DATA:  Fall lung cancer diagnosed December 2014. Previous radiation therapy ongoing maintenance chemotherapy. Subsequent treatment evaluation.  EXAM: CT CHEST WITH CONTRAST  TECHNIQUE: Multidetector CT imaging of the chest was performed during intravenous contrast administration.  CONTRAST:  63m OMNIPAQUE IOHEXOL 300 MG/ML  SOLN  COMPARISON:  CT 12/19/2014  FINDINGS: Mediastinum/Nodes: No axillary supraclavicular lymphadenopathy. No mediastinal hilar lymphadenopathy. There is mild thickening through the distal esophagus. Central pulmonary embolism.  Lungs/Pleura: There is atelectasis in the medial right upper lobe. Region of low attenuation within the atelectatic lung measures 16 mm compared to 20 mm on prior. There is perihilar bandlike atelectasis and mild ground-glass opacity which is not changed from prior consistent with radiation postsurgical change. No new nodularity.  Within the left lower lobe there is a calcified peripheral nodule on image 42 which is not changed from prior. Slightly superior in the left lower lobe there is a round 6 mm nodule (image 39, series 5 which is not present on comparison exam. Pint  Upper abdomen: Normal adrenal glands left hepatic lobe cysts noted.  Musculoskeletal: No aggressive osseous lesion.  IMPRESSION: 1. Stable postsurgical and postradiation change in the right hemi thorax. 2. New left lower lobe pulmonary nodule  measuring 6 mm is concerning for a metastatic lesion. Consider short-term follow-up in 3 to 4 weeks to exclude a small focus of infection and if lesion persists recommend biopsy.   Electronically Signed   By: SSuzy BouchardM.D.   On: 03/09/2015 14:44     ASSESSMENT AND PLAN: This is a very pleasant 74year old white female with history of stage IIIB non-small cell lung cancer status post a course of concurrent chemoradiation with weekly carboplatin and paclitaxel status post 7 cycles. The patient tolerated her treatment fairly well with no significant adverse effects except for the radiation induced esophagitis.  She was also treated with 3 cycles of consolidation chemotherapy with carboplatin and paclitaxel but carboplatin was discontinued the start of cycle #2  secondary to hypersensitivity reaction. Her restaging scan showed evidence for disease progression and the patient is currently undergoing immunotherapy with Nivolumab 3 mg/KG every 2 weeks is status post 17 cycles.   Her recent CT scan results showed relatively stable disease however, his base there is a new left lower lobe pulmonary nodule measuring 6 mm that was concerning for metastatic disease although it could also represent a small focus of infection. The radiologist, Dr. Leonia Reeves recommended short-term follow-up in 3-4 weeks and if lesion persists recommended a biopsy. The patient was discussed with also seen by Dr. Julien Nordmann. She will continue at this point with her immunotherapy with Nivolumab as scheduled today.   Regarding her episodes of hypoglycemia, patient was advised to carry glucose tablets with her perhaps Glucerna protein bound. She is to discuss these issues with her primary care physician, Dr. Wilson Singer who manages her diabetes.  She will follow-up  in 2 weeks for reevaluation before starting the next dose of her Nivolumab. Her next restaging CT scans will be due after cycle #21.  She was advised to call immediately if she has  any concerning symptoms in the interval. The patient voices understanding of current disease status and treatment options and is in agreement with the current care plan.  All questions were answered. The patient knows to call the clinic with any problems, questions or concerns. We can certainly see the patient much sooner if necessary.  Carlton Adam, PA-C  03/31/2015   ADDENDUM: Hematology/Oncology Attending: I had a face to face encounter with the patient. I recommended her care plan. This is a very pleasant 74 years old white female with metastatic non-small cell lung cancer currently undergoing immunotherapy with Nivolumab every 2 weeks status post 18 cycles and tolerating it fairly well except for issues with her diabetes control. The patient had hypoglycemia this morning but this is improved after she had some juice. I recommended for her to proceed with cycle #19 of her treatment with Nivolumab as a scheduled today. The patient would come back for follow-up visit in 2 weeks for reevaluation before starting the next cycle of her immunotherapy. She was advised to call immediately if she has any concerning symptoms in the interval.  Disclaimer: This note was dictated with voice recognition software. Similar sounding words can inadvertently be transcribed and may not be corrected upon review. Eilleen Kempf., MD 04/06/2015

## 2015-04-01 ENCOUNTER — Telehealth: Payer: Self-pay | Admitting: Internal Medicine

## 2015-04-01 ENCOUNTER — Telehealth: Payer: Self-pay | Admitting: *Deleted

## 2015-04-01 ENCOUNTER — Ambulatory Visit (HOSPITAL_BASED_OUTPATIENT_CLINIC_OR_DEPARTMENT_OTHER): Payer: Medicare Other

## 2015-04-01 ENCOUNTER — Other Ambulatory Visit: Payer: Medicare Other

## 2015-04-01 VITALS — BP 126/76 | HR 86 | Temp 98.3°F | Resp 20

## 2015-04-01 DIAGNOSIS — C342 Malignant neoplasm of middle lobe, bronchus or lung: Secondary | ICD-10-CM

## 2015-04-01 DIAGNOSIS — Z5112 Encounter for antineoplastic immunotherapy: Secondary | ICD-10-CM | POA: Diagnosis not present

## 2015-04-01 MED ORDER — NIVOLUMAB CHEMO INJECTION 100 MG/10ML
3.0000 mg/kg | Freq: Once | INTRAVENOUS | Status: AC
  Administered 2015-04-01: 180 mg via INTRAVENOUS
  Filled 2015-04-01: qty 18

## 2015-04-01 MED ORDER — SODIUM CHLORIDE 0.9 % IV SOLN
Freq: Once | INTRAVENOUS | Status: AC
  Administered 2015-04-01: 10:00:00 via INTRAVENOUS

## 2015-04-01 NOTE — Patient Instructions (Signed)
[facility] Discharge Instructions for Patients Receiving Chemotherapy  Today you received the following chemotherapy agents:  Nivolumab  To help prevent nausea and vomiting after your treatment, we encourage you to take your nausea medication as ordered per MD.   If you develop nausea and vomiting that is not controlled by your nausea medication, call the clinic.   BELOW ARE SYMPTOMS THAT SHOULD BE REPORTED IMMEDIATELY:  *FEVER GREATER THAN 100.5 F  *CHILLS WITH OR WITHOUT FEVER  NAUSEA AND VOMITING THAT IS NOT CONTROLLED WITH YOUR NAUSEA MEDICATION  *UNUSUAL SHORTNESS OF BREATH  *UNUSUAL BRUISING OR BLEEDING  TENDERNESS IN MOUTH AND THROAT WITH OR WITHOUT PRESENCE OF ULCERS  *URINARY PROBLEMS  *BOWEL PROBLEMS  UNUSUAL RASH Items with * indicate a potential emergency and should be followed up as soon as possible.  Feel free to call the clinic you have any questions or concerns. The clinic phone number is [phone].  Please show the CHEMO ALERT CARD at check-in to the Emergency Department and triage nurse.   

## 2015-04-01 NOTE — Patient Instructions (Signed)
Follow-up with Dr. Wilson Singer rate regarding your blood sugars and keeping glucose tablets with you. Follow-up in 2 weeks prior to next scheduled cycle of immunotherapy

## 2015-04-01 NOTE — Telephone Encounter (Signed)
s.w. pt and advised on appts.Marland KitchenMarland KitchenMarland KitchenMarland Kitchenpt ok and ware....transferred pt to triage she has questions about if her BP is under a certain level.

## 2015-04-01 NOTE — Telephone Encounter (Signed)
Patient left message that she will come and have lab today.  She had a scare yesterday and took extra sugar and this am her BS was 384 so she took 6 units of "quick insulin."  Dr. Wilson Singer told her that her blood sugar is high but that anything under 300 is acceptable.  Discussed with Awilda Metro NP.   Discussed with patient while she is here for chemo.  She is fine.  She monitors her blood sugar and is learning how to anticipate low blood sugar.  She will call Dr. Wilson Singer if needed.

## 2015-04-02 ENCOUNTER — Encounter (HOSPITAL_COMMUNITY): Payer: Medicare Other

## 2015-04-06 ENCOUNTER — Telehealth: Payer: Self-pay | Admitting: Internal Medicine

## 2015-04-06 DIAGNOSIS — Z5112 Encounter for antineoplastic immunotherapy: Secondary | ICD-10-CM | POA: Insufficient documentation

## 2015-04-06 NOTE — Telephone Encounter (Signed)
Moved 6/14 f/u from [facility] to [facility] and adjusted times for lab/fu/tx. Left message for patient and mailed schedule.

## 2015-04-14 ENCOUNTER — Ambulatory Visit (HOSPITAL_BASED_OUTPATIENT_CLINIC_OR_DEPARTMENT_OTHER): Payer: Medicare Other

## 2015-04-14 ENCOUNTER — Ambulatory Visit: Payer: Medicare Other

## 2015-04-14 ENCOUNTER — Other Ambulatory Visit (HOSPITAL_BASED_OUTPATIENT_CLINIC_OR_DEPARTMENT_OTHER): Payer: Medicare Other

## 2015-04-14 ENCOUNTER — Ambulatory Visit (HOSPITAL_BASED_OUTPATIENT_CLINIC_OR_DEPARTMENT_OTHER): Payer: Medicare Other | Admitting: Nurse Practitioner

## 2015-04-14 ENCOUNTER — Encounter: Payer: Self-pay | Admitting: Nurse Practitioner

## 2015-04-14 VITALS — BP 117/60 | HR 95 | Temp 97.8°F | Resp 19 | Ht 66.0 in | Wt 139.1 lb

## 2015-04-14 DIAGNOSIS — Z5112 Encounter for antineoplastic immunotherapy: Secondary | ICD-10-CM

## 2015-04-14 DIAGNOSIS — C342 Malignant neoplasm of middle lobe, bronchus or lung: Secondary | ICD-10-CM | POA: Diagnosis not present

## 2015-04-14 DIAGNOSIS — C3490 Malignant neoplasm of unspecified part of unspecified bronchus or lung: Secondary | ICD-10-CM

## 2015-04-14 DIAGNOSIS — Z79899 Other long term (current) drug therapy: Secondary | ICD-10-CM

## 2015-04-14 DIAGNOSIS — E876 Hypokalemia: Secondary | ICD-10-CM | POA: Diagnosis not present

## 2015-04-14 DIAGNOSIS — N951 Menopausal and female climacteric states: Secondary | ICD-10-CM | POA: Diagnosis not present

## 2015-04-14 DIAGNOSIS — M069 Rheumatoid arthritis, unspecified: Secondary | ICD-10-CM

## 2015-04-14 DIAGNOSIS — L409 Psoriasis, unspecified: Secondary | ICD-10-CM | POA: Insufficient documentation

## 2015-04-14 DIAGNOSIS — C3491 Malignant neoplasm of unspecified part of right bronchus or lung: Secondary | ICD-10-CM

## 2015-04-14 DIAGNOSIS — R232 Flushing: Secondary | ICD-10-CM | POA: Insufficient documentation

## 2015-04-14 DIAGNOSIS — R609 Edema, unspecified: Secondary | ICD-10-CM | POA: Diagnosis not present

## 2015-04-14 LAB — CBC WITH DIFFERENTIAL/PLATELET
BASO%: 0.7 % (ref 0.0–2.0)
BASOS ABS: 0.1 10*3/uL (ref 0.0–0.1)
EOS ABS: 2.1 10*3/uL — AB (ref 0.0–0.5)
EOS%: 21.3 % — AB (ref 0.0–7.0)
HCT: 42 % (ref 34.8–46.6)
HGB: 14 g/dL (ref 11.6–15.9)
LYMPH%: 10.1 % — ABNORMAL LOW (ref 14.0–49.7)
MCH: 30.5 pg (ref 25.1–34.0)
MCHC: 33.4 g/dL (ref 31.5–36.0)
MCV: 91.2 fL (ref 79.5–101.0)
MONO#: 0.5 10*3/uL (ref 0.1–0.9)
MONO%: 5.6 % (ref 0.0–14.0)
NEUT#: 6.2 10*3/uL (ref 1.5–6.5)
NEUT%: 62.3 % (ref 38.4–76.8)
Platelets: 221 10*3/uL (ref 145–400)
RBC: 4.61 10*6/uL (ref 3.70–5.45)
RDW: 14.2 % (ref 11.2–14.5)
WBC: 9.9 10*3/uL (ref 3.9–10.3)
lymph#: 1 10*3/uL (ref 0.9–3.3)

## 2015-04-14 LAB — COMPREHENSIVE METABOLIC PANEL (CC13)
ALBUMIN: 3.4 g/dL — AB (ref 3.5–5.0)
ALT: 16 U/L (ref 0–55)
AST: 17 U/L (ref 5–34)
Alkaline Phosphatase: 124 U/L (ref 40–150)
Anion Gap: 13 mEq/L — ABNORMAL HIGH (ref 3–11)
BUN: 17.8 mg/dL (ref 7.0–26.0)
CALCIUM: 9.1 mg/dL (ref 8.4–10.4)
CHLORIDE: 104 meq/L (ref 98–109)
CO2: 25 mEq/L (ref 22–29)
CREATININE: 0.8 mg/dL (ref 0.6–1.1)
EGFR: 75 mL/min/{1.73_m2} — ABNORMAL LOW (ref >90)
Glucose: 74 mg/dl (ref 70–140)
POTASSIUM: 3.4 meq/L — AB (ref 3.5–5.1)
Sodium: 142 mEq/L (ref 136–145)
TOTAL PROTEIN: 6.2 g/dL — AB (ref 6.4–8.3)
Total Bilirubin: 0.6 mg/dL (ref 0.20–1.20)

## 2015-04-14 LAB — TSH CHCC: TSH: 1.952 m(IU)/L (ref 0.308–3.960)

## 2015-04-14 MED ORDER — SODIUM CHLORIDE 0.9 % IV SOLN
3.0000 mg/kg | Freq: Once | INTRAVENOUS | Status: AC
  Administered 2015-04-14: 180 mg via INTRAVENOUS
  Filled 2015-04-14: qty 18

## 2015-04-14 MED ORDER — SODIUM CHLORIDE 0.9 % IV SOLN
Freq: Once | INTRAVENOUS | Status: AC
  Administered 2015-04-14: 16:00:00 via INTRAVENOUS

## 2015-04-14 MED ORDER — VENLAFAXINE HCL 37.5 MG PO TABS: 37.5000 mg | ORAL_TABLET | Freq: Every day | ORAL | Status: DC

## 2015-04-14 NOTE — Patient Instructions (Signed)
[facility] Discharge Instructions for Patients Receiving Chemotherapy  Today you received the following chemotherapy agents:  Nivolumab.  To help prevent nausea and vomiting after your treatment, we encourage you to take your nausea medication as directed.   If you develop nausea and vomiting that is not controlled by your nausea medication, call the clinic.   BELOW ARE SYMPTOMS THAT SHOULD BE REPORTED IMMEDIATELY:  *FEVER GREATER THAN 100.5 F  *CHILLS WITH OR WITHOUT FEVER  NAUSEA AND VOMITING THAT IS NOT CONTROLLED WITH YOUR NAUSEA MEDICATION  *UNUSUAL SHORTNESS OF BREATH  *UNUSUAL BRUISING OR BLEEDING  TENDERNESS IN MOUTH AND THROAT WITH OR WITHOUT PRESENCE OF ULCERS  *URINARY PROBLEMS  *BOWEL PROBLEMS  UNUSUAL RASH Items with * indicate a potential emergency and should be followed up as soon as possible.  Feel free to call the clinic you have any questions or concerns. The clinic phone number is [phone].  Please show the CHEMO ALERT CARD at check-in to the Emergency Department and triage nurse.   

## 2015-04-14 NOTE — Assessment & Plan Note (Signed)
Patient is experiencing some mild, chronic peripheral edema to her bilateral ankles.  She denies any worsening shortness of breath whatsoever.  On exam.-Patient does have +1 edema to bilateral ankles.  Advised patient to try compression hose and elevating her legs above the level of for heart whenever possible.

## 2015-04-14 NOTE — Assessment & Plan Note (Signed)
Potassium down to 3.4 today.  Patient states that she ran out of her potassium tablets; impulse some over-the-counter potassium supplements instead.  Patient states she does have a refill of the potassium tablet; but she will be unable to fill them until Thursday, 04/16/2015 due to insurance reasons.  Advised patient to push potassium in her diet; into refill her potassium prescription as soon as possible.

## 2015-04-14 NOTE — Assessment & Plan Note (Signed)
Patient appears to be tolerating her Nivolumab immunotherapy fairly well.  Blood counts remained stable.  Patient will proceed with her Nivolumab infusion today as planned.  Patient has plans to return on 04/28/2015 for labs and her next immunotherapy infusion.  Will also need to schedule a follow-up visit for that same day.

## 2015-04-14 NOTE — Assessment & Plan Note (Signed)
Patient has a history of chronic rheumatoid arthritis.  Patient reports that she used to receive Humira injections on a regular basis to control her rheumatoid arthritis; but these injections were discontinued when she began chemotherapy.  She states that her rheumatoid arthritis was very well controlled while she was receiving chemotherapy; but the rheumatoid arthritis has progressed since she has discontinued the chemotherapy.  Patient states she has an appointment for follow-up with her rheumatologist tomorrow, 04/15/2015.  She continues to complain of some slightly increased joint discomfort.  Patient was advised per Dr. Julien Nordmann to withhold from reinitiating the Humira injections or receiving any steroid medication for treatment of her rheumatoid arthritis-since she is receiving immunotherapy for treatment of her lung cancer.

## 2015-04-14 NOTE — Progress Notes (Signed)
SYMPTOM MANAGEMENT CLINIC   HPI: Megan [name] 74 y.o. female diagnosed with lung cancer.  Currently undergoing Nivolumab immunotherapy.  Patient presents to the [facility] today to receive her Nivolumab immunotherapy.  Patient has a history of chronic rheumatoid arthritis.  Patient reports that she used to receive Humira injections on a regular basis to control her rheumatoid arthritis; but these injections were discontinued when she began chemotherapy.  She states that her rheumatoid arthritis was very well controlled while she was receiving chemotherapy; but the rheumatoid arthritis has progressed since she has discontinued the chemotherapy.  Patient states she has an appointment for follow-up with her rheumatologist tomorrow, 04/15/2015.  She continues to complain of some slightly increased joint discomfort.  HPI  ROS  Past Medical History  Diagnosis Date  . Hypertension   . Rheumatoid arthritis   . GERD (gastroesophageal reflux disease)     no meds for  . Hx of radiation therapy 12/16/13-01/30/14    lung 66Gy  . Neuropathy   . Shortness of breath dyspnea     with exertion  . History of hiatal hernia   . Diabetes mellitus     insulin  . Cancer dx'd 09/2013    Lung ca    Past Surgical History  Procedure Laterality Date  . Cholecystectomy    . Abdominal hysterectomy    . Dilation and curettage of uterus    . Abdominal surgery    . Tonsillectomy    . Esophagogastroduodenoscopy (egd) with propofol N/A 10/09/2014    Procedure: ESOPHAGOGASTRODUODENOSCOPY (EGD) WITH PROPOFOL;  Surgeon: Inda Castle, MD;  Location: [facility];  Service: Endoscopy;  Laterality: N/A;  . Savory dilation N/A 10/09/2014    Procedure: SAVORY DILATION;  Surgeon: Inda Castle, MD;  Location: [facility];  Service: Endoscopy;  Laterality: N/A;  . Esophagogastroduodenoscopy N/A 12/12/2014    Procedure: ESOPHAGOGASTRODUODENOSCOPY (EGD);  Surgeon: Inda Castle, MD;  Location: [facility];  Service: Endoscopy;  Laterality: N/A;  with dilation  . Balloon dilation N/A 12/12/2014    Procedure: BALLOON DILATION;  Surgeon: Inda Castle, MD;  Location: [facility];  Service: Endoscopy;  Laterality: N/A;    has Lung mass; Need for prophylactic vaccination and inoculation against influenza; Squamous cell lung cancer; Cancer of middle lobe of lung; Right-sided chest wall pain; Radiation esophagitis; Dyspnea; Fatigue; Neuropathy due to chemotherapeutic drug; Fever; Dehydration; Leg cramps; Hypoalbuminemia; Thrombocytopenia, unspecified; Hypotension; Cancer associated pain; Malignant cachexia; Acute renal insufficiency; Lung collapse; Chemotherapy-induced neuropathy; Palliative care encounter; DNR (do not resuscitate) discussion; Neuropathic pain; Tachycardia; Depression; Radiation-induced esophageal stricture; Hyperglycemia due to type 2 diabetes mellitus; Emphysema of lung; Encounter for antineoplastic immunotherapy; Hot flashes; Peripheral edema; Hypokalemia; and Rheumatoid arthritis on her problem list.    is allergic to remicade; carboplatin; and hydromorphone.    Medication List       This list is accurate as of: 04/14/15  4:59 PM.  Always use your most recent med list.               B-D ULTRAFINE III SHORT PEN 31G X 8 MM Misc  Generic drug:  Insulin Pen Needle     docusate sodium 100 MG capsule  Commonly known as:  COLACE  Take 1 capsule (100 mg total) by mouth every 12 (twelve) hours.     dronabinol 10 MG capsule  Commonly known as:  MARINOL  Take 2 capsules (20 mg total) by mouth 2 (two) times daily before a meal.  Fluticasone-Salmeterol 100-50 MCG/DOSE Aepb  Commonly known as:  ADVAIR DISKUS  Inhale 1 puff into the lungs 2 (two) times daily.     Gabapentin (Once-Daily) 600 MG Tabs  Commonly known as:  GRALISE  Take 1,800 mg by mouth daily.     HUMALOG KWIKPEN 100 UNIT/ML KiwkPen  Generic drug:  insulin lispro     ibuprofen 600 MG tablet    Commonly known as:  ADVIL,MOTRIN  Take 1 tablet (600 mg total) by mouth every 6 (six) hours as needed. Take with food.     LamoTRIgine 25 MG Tb24 tablet  Commonly known as:  LAMICTAL XR  Take 1 tablet (25 mg total) by mouth daily.     lansoprazole 30 MG disintegrating tablet  Commonly known as:  PREVACID SOLUTAB  Take 1 tablet (30 mg total) by mouth daily.     LEVEMIR FLEXTOUCH 100 UNIT/ML Pen  Generic drug:  Insulin Detemir     lidocaine 5 %  Commonly known as:  LIDODERM  APPLY 3 PATCHES ONTO THE SKIN ONCE DAILY. LEAVE ON FOR 12 HOURS AND OFF FOR 12 HOURS. MAY CUT PATCH TO FIT     losartan-hydrochlorothiazide 100-25 MG per tablet  Commonly known as:  HYZAAR  Take 1 tablet by mouth daily.     meloxicam 15 MG tablet  Commonly known as:  MOBIC  Take 15 mg by mouth daily as needed.     nystatin-triamcinolone cream  Commonly known as:  MYCOLOG II  Apply 1 application topically as directed.     ONETOUCH DELICA LANCETS FINE Misc     ONETOUCH VERIO test strip  Generic drug:  glucose blood     pantoprazole 40 MG tablet  Commonly known as:  PROTONIX  Take 1 tablet (40 mg total) by mouth daily.     potassium chloride SA 20 MEQ tablet  Commonly known as:  K-DUR,KLOR-CON  Take 40 mEq by mouth daily.     prochlorperazine 10 MG tablet  Commonly known as:  COMPAZINE  Take 10 mg by mouth every 6 (six) hours as needed for nausea or vomiting.     UNABLE TO FIND  Med Name: Metanex- Vitamin B     venlafaxine 37.5 MG tablet  Commonly known as:  EFFEXOR  Take 1 tablet (37.5 mg total) by mouth daily.     zolpidem 10 MG tablet  Commonly known as:  AMBIEN  Take 10 mg by mouth at bedtime as needed for sleep.         PHYSICAL EXAMINATION  Oncology Vitals 04/14/2015 04/01/2015 03/31/2015 03/17/2015 03/17/2015 03/03/2015 02/17/2015  Height 168 cm - 168 cm - 168 cm 168 cm 168 cm  Weight 63.095 kg - 61.054 kg - 60.555 kg 59.648 kg 59.24 kg  Weight (lbs) 139 lbs 2 oz - 134 lbs 10 oz - 133  lbs 8 oz 131 lbs 8 oz 130 lbs 10 oz  BMI (kg/m2) 22.45 kg/m2 - 21.72 kg/m2 - 21.55 kg/m2 21.22 kg/m2 21.08 kg/m2  Temp 97.8 98.3 98 97.8 97.8 97.9 97.7  Pulse 95 86 91 94 91 90 54  Resp '19 20 18 18 17 18 19  ' SpO2 100 100 100 100 98 99 99  BSA (m2) 1.71 m2 - 1.69 m2 - 1.68 m2 1.67 m2 1.66 m2   BP Readings from Last 3 Encounters:  04/14/15 117/60  04/01/15 126/76  03/31/15 133/65    Physical Exam  Constitutional: She is oriented to person, place, and time.  Patient appears  mildly fatigued, frail, and chronically ill.  HENT:  Head: Normocephalic and atraumatic.  Eyes: Conjunctivae and EOM are normal. Pupils are equal, round, and reactive to light. Right eye exhibits no discharge. Left eye exhibits no discharge. No scleral icterus.  Neck: Normal range of motion.  Pulmonary/Chest: Effort normal. No respiratory distress.  Musculoskeletal: Normal range of motion. She exhibits edema. She exhibits no tenderness.  +1 edema to bilateral ankles.  Neurological: She is alert and oriented to person, place, and time.  Skin: Skin is warm and dry. There is pallor.  Psychiatric: Affect normal.  Nursing note and vitals reviewed.   LABORATORY DATA:. Appointment on 04/14/2015  Component Date Value Ref Range Status  . WBC 04/14/2015 9.9  3.9 - 10.3 10e3/uL Final  . NEUT# 04/14/2015 6.2  1.5 - 6.5 10e3/uL Final  . HGB 04/14/2015 14.0  11.6 - 15.9 g/dL Final  . HCT 04/14/2015 42.0  34.8 - 46.6 % Final  . Platelets 04/14/2015 221  145 - 400 10e3/uL Final  . MCV 04/14/2015 91.2  79.5 - 101.0 fL Final  . MCH 04/14/2015 30.5  25.1 - 34.0 pg Final  . MCHC 04/14/2015 33.4  31.5 - 36.0 g/dL Final  . RBC 04/14/2015 4.61  3.70 - 5.45 10e6/uL Final  . RDW 04/14/2015 14.2  11.2 - 14.5 % Final  . lymph# 04/14/2015 1.0  0.9 - 3.3 10e3/uL Final  . MONO# 04/14/2015 0.5  0.1 - 0.9 10e3/uL Final  . Eosinophils Absolute 04/14/2015 2.1* 0.0 - 0.5 10e3/uL Final  . Basophils Absolute 04/14/2015 0.1  0.0 - 0.1  10e3/uL Final  . NEUT% 04/14/2015 62.3  38.4 - 76.8 % Final  . LYMPH% 04/14/2015 10.1* 14.0 - 49.7 % Final  . MONO% 04/14/2015 5.6  0.0 - 14.0 % Final  . EOS% 04/14/2015 21.3* 0.0 - 7.0 % Final  . BASO% 04/14/2015 0.7  0.0 - 2.0 % Final  . TSH 04/14/2015 1.952  0.308 - 3.960 m(IU)/L Final  . Sodium 04/14/2015 142  136 - 145 mEq/L Final  . Potassium 04/14/2015 3.4* 3.5 - 5.1 mEq/L Final  . Chloride 04/14/2015 104  98 - 109 mEq/L Final  . CO2 04/14/2015 25  22 - 29 mEq/L Final  . Glucose 04/14/2015 74  70 - 140 mg/dl Final  . BUN 04/14/2015 17.8  7.0 - 26.0 mg/dL Final  . Creatinine 04/14/2015 0.8  0.6 - 1.1 mg/dL Final  . Total Bilirubin 04/14/2015 0.60  0.20 - 1.20 mg/dL Final  . Alkaline Phosphatase 04/14/2015 124  40 - 150 U/L Final  . AST 04/14/2015 17  5 - 34 U/L Final  . ALT 04/14/2015 16  0 - 55 U/L Final  . Total Protein 04/14/2015 6.2* 6.4 - 8.3 g/dL Final  . Albumin 04/14/2015 3.4* 3.5 - 5.0 g/dL Final  . Calcium 04/14/2015 9.1  8.4 - 10.4 mg/dL Final  . Anion Gap 04/14/2015 13* 3 - 11 mEq/L Final  . EGFR 04/14/2015 75* >90 ml/min/1.73 m2 Final   eGFR is calculated using the CKD-EPI Creatinine Equation (2009)     RADIOGRAPHIC STUDIES: No results found.  ASSESSMENT/PLAN:    Squamous cell lung cancer Patient appears to be tolerating her Nivolumab immunotherapy fairly well.  Blood counts remained stable.  Patient will proceed with her Nivolumab infusion today as planned.  Patient has plans to return on 04/28/2015 for labs and her next immunotherapy infusion.  Will also need to schedule a follow-up visit for that same day.  Hot flashes Patient  is complaining of increased frequency of her hot flashes recently.  She states that she has on occasion had hot flashes on an every 20 minutes basis.  Will prescribe Effexor 37.5 mg on a daily basis to see if that helps.  Peripheral edema Patient is experiencing some mild, chronic peripheral edema to her bilateral ankles.  She  denies any worsening shortness of breath whatsoever.  On exam.-Patient does have +1 edema to bilateral ankles.  Advised patient to try compression hose and elevating her legs above the level of for heart whenever possible.  Hypokalemia Potassium down to 3.4 today.  Patient states that she ran out of her potassium tablets; impulse some over-the-counter potassium supplements instead.  Patient states she does have a refill of the potassium tablet; but she will be unable to fill them until Thursday, 04/16/2015 due to insurance reasons.  Advised patient to push potassium in her diet; into refill her potassium prescription as soon as possible.  Rheumatoid arthritis Patient has a history of chronic rheumatoid arthritis.  Patient reports that she used to receive Humira injections on a regular basis to control her rheumatoid arthritis; but these injections were discontinued when she began chemotherapy.  She states that her rheumatoid arthritis was very well controlled while she was receiving chemotherapy; but the rheumatoid arthritis has progressed since she has discontinued the chemotherapy.  Patient states she has an appointment for follow-up with her rheumatologist tomorrow, 04/15/2015.  She continues to complain of some slightly increased joint discomfort.  Patient was advised per Dr. Julien Nordmann to withhold from reinitiating the Humira injections or receiving any steroid medication for treatment of her rheumatoid arthritis-since she is receiving immunotherapy for treatment of her lung cancer.  Patient stated understanding of all instructions; and was in agreement with this plan of care. The patient knows to call the clinic with any problems, questions or concerns.   This was a shared visit with Dr. Julien Nordmann today.  Total time spent with patient was 25 minutes;  with greater than 75 percent of that time spent in face to face counseling regarding patient's symptoms,  and coordination of care and follow  up.  Disclaimer: This note was dictated with voice recognition software. Similar sounding words can inadvertently be transcribed and may not be corrected upon review.   Drue Second, NP 04/14/2015   ADDENDUM: Hematology/Oncology Attending: I had a face to face encounter with the patient. I recommended her care plan. This is a very pleasant 74 years old white female with progressive non-small cell lung cancer currently undergoing treatment with immunotherapy with Nivolumab status post 19 cycles and tolerating her treatment fairly well. She continues to complain of arthralgia as well as hot flashes. I recommended for the patient to start treatment with Effexor for the hot flashes. She will proceed with cycle #20 today as a scheduled. I will see the patient back for follow-up visit in 2 weeks for reevaluation before starting cycle #21. She was advised to call immediately if she has any concerning symptoms in the interval.  Disclaimer: This note was dictated with voice recognition software. Similar sounding words can inadvertently be transcribed and may not be corrected upon review. Eilleen Kempf., MD 04/15/2015

## 2015-04-14 NOTE — Assessment & Plan Note (Signed)
Patient is complaining of increased frequency of her hot flashes recently.  She states that she has on occasion had hot flashes on an every 20 minutes basis.  Will prescribe Effexor 37.5 mg on a daily basis to see if that helps.

## 2015-04-15 ENCOUNTER — Telehealth: Payer: Self-pay | Admitting: Nurse Practitioner

## 2015-04-15 NOTE — Telephone Encounter (Signed)
Called  And left a message with her 6/28 appoitnments

## 2015-04-28 ENCOUNTER — Encounter: Payer: Self-pay | Admitting: Physician Assistant

## 2015-04-28 ENCOUNTER — Other Ambulatory Visit: Payer: Medicare Other

## 2015-04-28 ENCOUNTER — Other Ambulatory Visit (HOSPITAL_BASED_OUTPATIENT_CLINIC_OR_DEPARTMENT_OTHER): Payer: Medicare Other

## 2015-04-28 ENCOUNTER — Ambulatory Visit (HOSPITAL_BASED_OUTPATIENT_CLINIC_OR_DEPARTMENT_OTHER): Payer: Medicare Other | Admitting: Physician Assistant

## 2015-04-28 ENCOUNTER — Telehealth: Payer: Self-pay | Admitting: *Deleted

## 2015-04-28 ENCOUNTER — Ambulatory Visit: Payer: Medicare Other

## 2015-04-28 ENCOUNTER — Ambulatory Visit (HOSPITAL_BASED_OUTPATIENT_CLINIC_OR_DEPARTMENT_OTHER): Payer: Medicare Other

## 2015-04-28 ENCOUNTER — Other Ambulatory Visit: Payer: Self-pay | Admitting: Adult Health

## 2015-04-28 ENCOUNTER — Telehealth: Payer: Self-pay | Admitting: Physician Assistant

## 2015-04-28 VITALS — BP 102/62 | HR 97 | Temp 97.8°F | Resp 18 | Ht 66.0 in | Wt 135.4 lb

## 2015-04-28 DIAGNOSIS — R197 Diarrhea, unspecified: Secondary | ICD-10-CM | POA: Diagnosis not present

## 2015-04-28 DIAGNOSIS — C342 Malignant neoplasm of middle lobe, bronchus or lung: Secondary | ICD-10-CM | POA: Diagnosis not present

## 2015-04-28 DIAGNOSIS — C3491 Malignant neoplasm of unspecified part of right bronchus or lung: Secondary | ICD-10-CM

## 2015-04-28 DIAGNOSIS — Z79899 Other long term (current) drug therapy: Secondary | ICD-10-CM

## 2015-04-28 DIAGNOSIS — E038 Other specified hypothyroidism: Secondary | ICD-10-CM | POA: Diagnosis not present

## 2015-04-28 DIAGNOSIS — Z5112 Encounter for antineoplastic immunotherapy: Secondary | ICD-10-CM

## 2015-04-28 LAB — CBC WITH DIFFERENTIAL/PLATELET
BASO%: 0.6 % (ref 0.0–2.0)
Basophils Absolute: 0 10*3/uL (ref 0.0–0.1)
EOS%: 21 % — ABNORMAL HIGH (ref 0.0–7.0)
Eosinophils Absolute: 1.7 10*3/uL — ABNORMAL HIGH (ref 0.0–0.5)
HCT: 40.1 % (ref 34.8–46.6)
HGB: 13.4 g/dL (ref 11.6–15.9)
LYMPH%: 10.5 % — ABNORMAL LOW (ref 14.0–49.7)
MCH: 30.7 pg (ref 25.1–34.0)
MCHC: 33.4 g/dL (ref 31.5–36.0)
MCV: 91.9 fL (ref 79.5–101.0)
MONO#: 0.5 10*3/uL (ref 0.1–0.9)
MONO%: 5.6 % (ref 0.0–14.0)
NEUT#: 5.1 10*3/uL (ref 1.5–6.5)
NEUT%: 62.3 % (ref 38.4–76.8)
Platelets: 189 10*3/uL (ref 145–400)
RBC: 4.37 10*6/uL (ref 3.70–5.45)
RDW: 14.1 % (ref 11.2–14.5)
WBC: 8.2 10*3/uL (ref 3.9–10.3)
lymph#: 0.9 10*3/uL (ref 0.9–3.3)

## 2015-04-28 LAB — COMPREHENSIVE METABOLIC PANEL (CC13)
ALT: 16 U/L (ref 0–55)
ANION GAP: 9 meq/L (ref 3–11)
AST: 14 U/L (ref 5–34)
Albumin: 3.3 g/dL — ABNORMAL LOW (ref 3.5–5.0)
Alkaline Phosphatase: 137 U/L (ref 40–150)
BUN: 20.3 mg/dL (ref 7.0–26.0)
CALCIUM: 8.5 mg/dL (ref 8.4–10.4)
CO2: 27 meq/L (ref 22–29)
CREATININE: 0.8 mg/dL (ref 0.6–1.1)
Chloride: 102 mEq/L (ref 98–109)
EGFR: 75 mL/min/{1.73_m2} — AB (ref >90)
Glucose: 312 mg/dl — ABNORMAL HIGH (ref 70–140)
Potassium: 3.5 mEq/L (ref 3.5–5.1)
Sodium: 138 mEq/L (ref 136–145)
Total Bilirubin: 0.39 mg/dL (ref 0.20–1.20)
Total Protein: 6 g/dL — ABNORMAL LOW (ref 6.4–8.3)

## 2015-04-28 MED ORDER — SODIUM CHLORIDE 0.9 % IV SOLN
Freq: Once | INTRAVENOUS | Status: AC
  Administered 2015-04-28: 16:00:00 via INTRAVENOUS

## 2015-04-28 MED ORDER — SODIUM CHLORIDE 0.9 % IV SOLN
3.0000 mg/kg | Freq: Once | INTRAVENOUS | Status: AC
  Administered 2015-04-28: 180 mg via INTRAVENOUS
  Filled 2015-04-28: qty 18

## 2015-04-28 NOTE — Telephone Encounter (Signed)
Per staff message and POF I have scheduled appts. Advised scheduler of appts. JMW  

## 2015-04-28 NOTE — Telephone Encounter (Signed)
per pof to sch pt appt-gave pt copy of avs-sent MW email to sch trmt-sent MM email to adv pt sch w/ Curcio no openings on his sch

## 2015-04-28 NOTE — Progress Notes (Addendum)
[place] [phone]) [phone]   [phone]  OFFICE PROGRESS NOTE  [name], MD [address] [place] [address] [place] [address]  DIAGNOSIS: Squamous cell lung cancer  Primary site: Lung (Right)  Staging method: AJCC 7th Edition  Clinical: Stage IIIB (T3, N3, M0)  Summary: Stage IIIB (T3, N3, M0)   PRIOR THERAPY:  1) Concurrent chemoradiation with weekly chemotherapy in the form of carboplatin for AUC of 2 and paclitaxel 45 mg/m2. Status post 7 week of treatment. Last dose was given 01/27/2014 no significant response in her disease.  2) Consolidation chemotherapy with carboplatin for AUC of 5 and paclitaxel 175 mg/M2 every 3 weeks with Neulasta support. First dose on 04/15/2014. Status post 3 cycles. Carboplatin was discontinued starting cycle #2 secondary to hypersensitivity reaction.  CURRENT THERAPY: Immunotherapy with Opdivo (Nivolumab) '3mg'$ /kg given every 2 weeks. Status post 20cycles.  CHEMOTHERAPY INTENT: Control/Palliative  CURRENT # OF CHEMOTHERAPY CYCLES: 21 CURRENT ANTIEMETICS: Zofran, dexamethasone, Compazine  CURRENT SMOKING STATUS: Former smoker, quit 12/05/1997  ORAL CHEMOTHERAPY AND CONSENT: n/a   CURRENT BISPHOSPHONATES USE: None  PAIN MANAGEMENT: no cancer related pain, patient does have RA  NARCOTICS INDUCED CONSTIPATION: none  LIVING WILL AND CODE STATUS: ?   INTERVAL HISTORY: Megan [name] 74 y.o. female returns to the clinic today for followup visit. She reports that she felt an ache on her right anterior rib cage but did not feel any lumps or bumps. This has resolved. verall she is otherwise feeling quite well.. She denies any episodes of diarrhea. She voiced no other specific complaints except for the persistent peripheral neuropathy.  She says that her neuropathy is better with the change in the medication. The patient denied having any significant chest pain but has shortness of breath with exertion with no  cough or hemoptysis. She denied skin rash or diarrhea. She denied having any significant weight loss or night sweats. She has no nausea or vomiting.  She denied any skin rash. She is today to start cycle #21 of her treatment with Nivolumab.   MEDICAL HISTORY: Past Medical History  Diagnosis Date  . Hypertension   . Rheumatoid arthritis   . GERD (gastroesophageal reflux disease)     no meds for  . Hx of radiation therapy 12/16/13-01/30/14    lung 66Gy  . Neuropathy   . Shortness of breath dyspnea     with exertion  . History of hiatal hernia   . Diabetes mellitus     insulin  . Cancer dx'd 09/2013    Lung ca    ALLERGIES:  is allergic to remicade; carboplatin; and hydromorphone.  MEDICATIONS:  Current Outpatient Prescriptions  Medication Sig Dispense Refill  . B-D ULTRAFINE III SHORT PEN 31G X 8 MM MISC   0  . docusate sodium (COLACE) 100 MG capsule Take 1 capsule (100 mg total) by mouth every 12 (twelve) hours. 60 capsule 0  . dronabinol (MARINOL) 10 MG capsule Take 2 capsules (20 mg total) by mouth 2 (two) times daily before a meal. 120 capsule 3  . Fluticasone-Salmeterol (ADVAIR DISKUS) 100-50 MCG/DOSE AEPB Inhale 1 puff into the lungs 2 (two) times daily. 60 each 5  . Gabapentin, Once-Daily, (GRALISE) 600 MG TABS Take 1,800 mg by mouth daily. 90 tablet 6  . HUMALOG KWIKPEN 100 UNIT/ML KiwkPen   0  . ibuprofen (ADVIL,MOTRIN) 600 MG tablet Take 1 tablet (600 mg total) by mouth every 6 (six) hours as needed. Take with food.  30 tablet 0  . lamoTRIgine (LAMICTAL) 25 MG tablet   2  . lansoprazole (PREVACID SOLUTAB) 30 MG disintegrating tablet Take 1 tablet (30 mg total) by mouth daily. 90 tablet 3  . LEVEMIR FLEXTOUCH 100 UNIT/ML Pen   0  . lidocaine (LIDODERM) 5 % APPLY 3 PATCHES ONTO THE SKIN ONCE DAILY. LEAVE ON FOR 12 HOURS AND OFF FOR 12 HOURS. MAY CUT PATCH TO FIT 90 patch 3  . losartan-hydrochlorothiazide (HYZAAR) 100-25 MG per tablet Take 1 tablet by mouth daily.    .  meloxicam (MOBIC) 15 MG tablet Take 15 mg by mouth daily as needed.     . nystatin-triamcinolone (MYCOLOG II) cream Apply 1 application topically as directed.  0  . ONETOUCH DELICA LANCETS FINE MISC   0  . ONETOUCH VERIO test strip   5  . pantoprazole (PROTONIX) 40 MG tablet Take 1 tablet (40 mg total) by mouth daily. 90 tablet 0  . pantoprazole (PROTONIX) 40 MG tablet TAKE 1 TABLET BY MOUTH DAILY 30 tablet 0  . potassium chloride SA (K-DUR,KLOR-CON) 20 MEQ tablet Take 40 mEq by mouth daily.    . prochlorperazine (COMPAZINE) 10 MG tablet Take 10 mg by mouth every 6 (six) hours as needed for nausea or vomiting.    . sulfaSALAzine (AZULFIDINE) 500 MG EC tablet TK 2 TS PO BID  2  . UNABLE TO FIND Med Name: Metanex- Vitamin B    . venlafaxine (EFFEXOR) 37.5 MG tablet Take 1 tablet (37.5 mg total) by mouth daily. 30 tablet 1  . zolpidem (AMBIEN) 10 MG tablet Take 10 mg by mouth at bedtime as needed for sleep.     No current facility-administered medications for this visit.   Facility-Administered Medications Ordered in Other Visits  Medication Dose Route Frequency Provider Last Rate Last Dose  . nivolumab (OPDIVO) 180 mg in sodium chloride 0.9 % 100 mL chemo infusion  3 mg/kg (Order-Specific) Intravenous Once Curt Bears, MD 118 mL/hr at 04/28/15 1622 180 mg at 04/28/15 1622  . sodium chloride 0.9 % injection 10 mL  10 mL Intracatheter PRN Curt Bears, MD   10 mL at 12/30/13 1121    SURGICAL HISTORY:  Past Surgical History  Procedure Laterality Date  . Cholecystectomy    . Abdominal hysterectomy    . Dilation and curettage of uterus    . Abdominal surgery    . Tonsillectomy    . Esophagogastroduodenoscopy (egd) with propofol N/A 10/09/2014    Procedure: ESOPHAGOGASTRODUODENOSCOPY (EGD) WITH PROPOFOL;  Surgeon: Inda Castle, MD;  Location: [facility];  Service: Endoscopy;  Laterality: N/A;  . Savory dilation N/A 10/09/2014    Procedure: SAVORY DILATION;  Surgeon: Inda Castle, MD;  Location: [facility];  Service: Endoscopy;  Laterality: N/A;  . Esophagogastroduodenoscopy N/A 12/12/2014    Procedure: ESOPHAGOGASTRODUODENOSCOPY (EGD);  Surgeon: Inda Castle, MD;  Location: [facility];  Service: Endoscopy;  Laterality: N/A;  with dilation  . Balloon dilation N/A 12/12/2014    Procedure: BALLOON DILATION;  Surgeon: Inda Castle, MD;  Location: [facility];  Service: Endoscopy;  Laterality: N/A;    REVIEW OF SYSTEMS:  Constitutional: positive for fatigue Eyes: negative Ears, nose, mouth, throat, and face: negative Respiratory: negative Cardiovascular: negative Gastrointestinal: negative Genitourinary:negative Integument/breast: negative Hematologic/lymphatic: negative Musculoskeletal:positive for bone pain Neurological: positive for paresthesia Behavioral/Psych: negative Endocrine: negative Allergic/Immunologic: negative   PHYSICAL EXAMINATION: General appearance: alert, cooperative and no distress Head: Normocephalic, without obvious abnormality, atraumatic Neck: no adenopathy, no  JVD, supple, symmetrical, trachea midline and thyroid not enlarged, symmetric, no tenderness/mass/nodules Lymph nodes: Cervical, supraclavicular, and axillary nodes normal. Resp: clear to auscultation bilaterally Back: symmetric, no curvature. ROM normal. No CVA tenderness. Cardio: regular rate and rhythm, S1, S2 normal, no murmur, click, rub or gallop GI: soft, non-tender; bowel sounds normal; no masses,  no organomegaly Extremities: extremities normal, atraumatic, no cyanosis or edema Neurologic: Alert and oriented X 3, normal strength and tone. Normal symmetric reflexes. Normal coordination and gait  ECOG PERFORMANCE STATUS: 1 - Symptomatic but completely ambulatory  Blood pressure 102/62, pulse 97, temperature 97.8 F (36.6 C), temperature source Oral, resp. rate 18, height '5\' 6"'$  (1.676 m), weight 135 lb 6.4 oz (61.417 kg), SpO2 95 %.  LABORATORY DATA: Lab  Results  Component Value Date   WBC 8.2 04/28/2015   HGB 13.4 04/28/2015   HCT 40.1 04/28/2015   MCV 91.9 04/28/2015   PLT 189 04/28/2015      Chemistry      Component Value Date/Time   NA 138 04/28/2015 1445   NA 138 10/09/2014 1328   K 3.5 04/28/2015 1445   K 3.7 10/09/2014 1328   CL 92* 09/08/2014 1417   CO2 27 04/28/2015 1445   CO2 28 09/08/2014 1417   BUN 20.3 04/28/2015 1445   BUN 15 09/08/2014 1417   CREATININE 0.8 04/28/2015 1445   CREATININE 0.67 09/08/2014 1417   GLU 299* 02/02/2015 1348      Component Value Date/Time   CALCIUM 8.5 04/28/2015 1445   CALCIUM 9.6 09/08/2014 1417   ALKPHOS 137 04/28/2015 1445   ALKPHOS 118* 09/08/2014 1417   AST 14 04/28/2015 1445   AST 17 09/08/2014 1417   ALT 16 04/28/2015 1445   ALT 25 09/08/2014 1417   BILITOT 0.39 04/28/2015 1445   BILITOT 0.4 09/08/2014 1417       RADIOGRAPHIC STUDIES: No results found.   ASSESSMENT AND PLAN: This is a very pleasant 74 year old white female with history of stage IIIB non-small cell lung cancer status post a course of concurrent chemoradiation with weekly carboplatin and paclitaxel status post 7 cycles. The patient tolerated her treatment fairly well with no significant adverse effects except for the radiation induced esophagitis.  She was also treated with 3 cycles of consolidation chemotherapy with carboplatin and paclitaxel but carboplatin was discontinued the start of cycle #2 secondary to hypersensitivity reaction. Her restaging scan showed evidence for disease progression and the patient is currently undergoing immunotherapy with Nivolumab 3 mg/KG every 2 weeks is status post 20 cycles.   Her recent CT scan results showed relatively stable disease however,  there is a new left lower lobe pulmonary nodule measuring 6 mm that was concerning for metastatic disease although it could also represent a small focus of infection. The radiologist, Dr. Leonia Reeves recommended short-term follow-up in  3-4 weeks and if lesion persists recommended a biopsy. The patient was discussed with also seen by Dr. Julien Nordmann. She will continue at this point with her immunotherapy with Nivolumab as scheduled.   She will follow-up  in 2 weeks for reevaluation before starting the next dose of her Nivolumab, cycle #22. When she returns in 2 weeks she will return with a restaging CT scan of the chest with contrast to reevaluate her disease.   She was advised to call immediately if she has any concerning symptoms in the interval. The patient voices understanding of current disease status and treatment options and is in agreement with the current  care plan.  All questions were answered. The patient knows to call the clinic with any problems, questions or concerns. We can certainly see the patient much sooner if necessary.  Carlton Adam, PA-C  04/28/2015  ADDENDUM: Hematology/Oncology Attending: I had a face to face encounter with the patient. I recommended her care plan. This is a very pleasant 74 years old white female with progressive non-small cell lung cancer, squamous cell carcinoma who is currently undergoing treatment with immunotherapy with Nivolumab status post 20 cycles. The patient is tolerating her treatment fairly well was no significant adverse effect except for intermittent hyper glycemia as well as hypothyroidism. She is currently undergoing treatment for her diabetes and hypothyroidism by her primary care physician. I recommended for the patient to proceed with cycle #21 today as a scheduled. She would come back for follow-up visit in 2 weeks for reevaluation before starting cycle #22after repeating CT scan of the chest for restaging of her disease. The patient was advised to call immediately if she has any concerning symptoms in the interval.  Disclaimer: This note was dictated with voice recognition software. Similar sounding words can inadvertently be transcribed and may be missed upon  review.  Eilleen Kempf., MD 05/02/2015

## 2015-04-28 NOTE — Patient Instructions (Signed)
[facility] Discharge Instructions for Patients Receiving Chemotherapy  Today you received the following chemotherapy agents nivolumab   To help prevent nausea and vomiting after your treatment, we encourage you to take your nausea medication as directed   If you develop nausea and vomiting that is not controlled by your nausea medication, call the clinic.   BELOW ARE SYMPTOMS THAT SHOULD BE REPORTED IMMEDIATELY:  *FEVER GREATER THAN 100.5 F  *CHILLS WITH OR WITHOUT FEVER  NAUSEA AND VOMITING THAT IS NOT CONTROLLED WITH YOUR NAUSEA MEDICATION  *UNUSUAL SHORTNESS OF BREATH  *UNUSUAL BRUISING OR BLEEDING  TENDERNESS IN MOUTH AND THROAT WITH OR WITHOUT PRESENCE OF ULCERS  *URINARY PROBLEMS  *BOWEL PROBLEMS  UNUSUAL RASH Items with * indicate a potential emergency and should be followed up as soon as possible.  Feel free to call the clinic you have any questions or concerns. The clinic phone number is [phone].  

## 2015-04-29 LAB — TSH CHCC: TSH: 2.673 m(IU)/L (ref 0.308–3.960)

## 2015-04-30 ENCOUNTER — Other Ambulatory Visit: Payer: Self-pay | Admitting: Neurology

## 2015-04-30 NOTE — Telephone Encounter (Signed)
I spoke with the patient who said she decided to stay on this med.

## 2015-05-01 NOTE — Patient Instructions (Signed)
Follow-up in 2 weeks with a restaging CT scan of your chest to reevaluate your disease 

## 2015-05-06 ENCOUNTER — Ambulatory Visit (INDEPENDENT_AMBULATORY_CARE_PROVIDER_SITE_OTHER): Payer: Medicare Other | Admitting: Internal Medicine

## 2015-05-06 ENCOUNTER — Encounter: Payer: Self-pay | Admitting: Internal Medicine

## 2015-05-06 VITALS — BP 96/50 | HR 92 | Wt 134.2 lb

## 2015-05-06 DIAGNOSIS — R530 Neoplastic (malignant) related fatigue: Secondary | ICD-10-CM

## 2015-05-06 DIAGNOSIS — M545 Low back pain, unspecified: Secondary | ICD-10-CM | POA: Insufficient documentation

## 2015-05-06 DIAGNOSIS — J209 Acute bronchitis, unspecified: Secondary | ICD-10-CM

## 2015-05-06 MED ORDER — AZITHROMYCIN 250 MG PO TABS: ORAL_TABLET | ORAL | Status: DC

## 2015-05-06 MED ORDER — PREDNISONE 10 MG PO TABS: ORAL_TABLET | ORAL | Status: DC

## 2015-05-06 NOTE — Patient Instructions (Addendum)
ICD-9-CM ICD-10-CM   1. Acute bronchitis, unspecified organism 466.0 J20.9   2. Neoplastic malignant related fatigue 780.79 R53.0   3. Low back pain without sciatica, unspecified back pain laterality 724.2 M54.5    Try zpak  Please take prednisone 40 mg x1 day, then 30 mg x1 day, then 20 mg x1 day, then 10 mg x1 day, and then 5 mg x1 day and stop  Will let cancer team know about your back ache  Followup  6 months or sooner if needed

## 2015-05-06 NOTE — Progress Notes (Signed)
Subjective:    Patient ID: Megan [name], female    DOB: [birth year], 74 y.o.   MRN: [id number]  HPI   Subjective:    Patient ID: Megan [name], female    DOB: [birth year], 74 y.o.   MRN: [id number]  HPI     OV 11/26/2014  Chief Complaint  Patient presents with  . Follow-up    Pt stated her mobility has decreased. Pt c/o DOE, dry cough. Pt denies CP/tightness.     FU COOPD, Lung cancer with resultant neuropathy, fatigue, etc.. (declined hospice consideration dec 2015). Currently in Nivolumab Rx.    She seems a bit better overall. Her female friend Mr Elnora Morrison is with her today. SHe is now several months into Nivolumab Rx. ECOG is 3 - >70% if time in chair or bed during waking hours per female friend. This is an improvement. Possibly related to home PT visitiing her but she denies this was the case. Home PT is complete. SHe is now keen to join pulmonary rehab at [facility]. She feels it helped her immensely in past. Peripheral neuropathy still a problem - her neurologist wrote to me and wondered about starting lamictal; I am fine wit hthat. (she has failed cymbalta and lyrica in past). Mood some better. Dyspnea is not the major problem of all things below   OV 01/27/15  FU COOPD, Lung cancer with resultant neuropathy, fatigue, etc.. (declined hospice consideration dec 2015). Currently in Nivolumab Rx.   Overall better. Now driving. Could not enter pulmonary rehab due to hyperglycemia - difficult to control Nivlumab on hold. CT chest feb 2016 show cancer under control. Depression, fatigue, neuropathy all better. Of note, she had hypolgycemai few days ago and got aphasic.     OV 05/06/2015  Chief Complaint  Patient presents with  . Follow-up    Pt c/o increased fatigue, increased sob with exertion, and non-productive cough with wheezing      FU COOPD, Lung cancer with resultant neuropathy from platin based Rx , fatigue, etc.. (declined hospice consideration dec 2015).  Currently in Nivolumab Rx with high sugar Rx   She presents for copd but has numerouis other complaints - all generally related to chemo sidefedcs. Last visit she was drivign car and improved. Says was doing well til la week ago. Ols Chart review shows last infusion cycle #21 of nivolumab 04/28/15. Sometime after that having  New rt msk chestg pain, non specific back ache, fatigue, sore throat. That still persists. Significant . Unavle to drive car now. Feel stsarted after a cold but cold is gone. Other symptoms persists but some worse and highlighted in black. Also with sore throat. No wheezing. No worsenign dyspnea. N neuromuscolar weakness or bladder or bowel problems   CT chest 03/09/15 - personally visualized image - shows clearance of cancer essentially IMPRESSION: 1. Stable postsurgical and postradiation change in the right hemi thorax. 2. New left lower lobe pulmonary nodule measuring 6 mm is concerning for a metastatic lesion. Consider short-term follow-up in 3 to 4 weeks to exclude a small focus of infection and if lesion persists recommend biopsy.   Electronically Signed  By: Suzy Bouchard M.D.  On: 03/09/2015 14:44   [place] Symptom Assessment Numerical Scale 0 is no problem -> 10 worst problem 07/29/2014   08/27/2014  09/30/2014  01/27/15  05/06/2015   No Pain -> Worst pain 6, in mid scapula, left shoulder, buttock and thigh and feet - did have platin neuropathy 8.5 7, some better  5 9  No Tiredness -> Worset tiredness 9 7 9, worse 5 9  No Nausea -> Worst nausea 1 0 9, worse 3 3  No Depression -> Worst depression 4 9 9, same 3 3  No Anxiety -> Worst Anxiety 6 9 7, same 6 3  No Drowsiness -> Worst Drowsiness 10 7 9, worse 3 8  Best appetite-> Worst Appetitle '7 10 8 '$ same 7 3  Best Feeling of well being -> Worst feeling 1 6 7, worse 8 8  No dyspnea-> Worst dyspnea 9 8 4, better 4 8  Other problem (none -> severe) none Tachycardia  - Physical therapy refuses to work wit her. Now in AECOPD with wheeze on exam 10 nos 7. hypoglycemia and aphasia. Called EMS [place] sunday 8  Completed by  MR/patent MR/patient patient patient   comments  All worse Some better, some worse Improved, driving around       Current outpatient prescriptions:  .  B-D ULTRAFINE III SHORT PEN 31G X 8 MM MISC, , Disp: , Rfl: 0 .  docusate sodium (COLACE) 100 MG capsule, Take 1 capsule (100 mg total) by mouth every 12 (twelve) hours., Disp: 60 capsule, Rfl: 0 .  dronabinol (MARINOL) 10 MG capsule, Take 2 capsules (20 mg total) by mouth 2 (two) times daily before a meal., Disp: 120 capsule, Rfl: 3 .  Fluticasone-Salmeterol (ADVAIR DISKUS) 100-50 MCG/DOSE AEPB, Inhale 1 puff into the lungs 2 (two) times daily., Disp: 60 each, Rfl: 5 .  Gabapentin, Once-Daily, (GRALISE) 600 MG TABS, Take 1,800 mg by mouth daily., Disp: 90 tablet, Rfl: 6 .  HUMALOG KWIKPEN 100 UNIT/ML KiwkPen, , Disp: , Rfl: 0 .  ibuprofen (ADVIL,MOTRIN) 600 MG tablet, Take 1 tablet (600 mg total) by mouth every 6 (six) hours as needed. Take with food., Disp: 30 tablet, Rfl: 0 .  LamoTRIgine (LAMICTAL XR) 25 MG TB24 tablet, TAKE 1 TABLET BY MOUTH EVERY DAY, Disp: 30 tablet, Rfl: 3 .  lansoprazole (PREVACID SOLUTAB) 30 MG disintegrating tablet, Take 1 tablet (30 mg total) by mouth daily., Disp: 90 tablet, Rfl: 3 .  LEVEMIR FLEXTOUCH 100 UNIT/ML Pen, , Disp: , Rfl: 0 .  lidocaine (LIDODERM) 5 %, APPLY 3 PATCHES ONTO THE SKIN ONCE DAILY. LEAVE ON FOR 12 HOURS AND OFF FOR 12 HOURS. MAY CUT PATCH TO FIT, Disp: 90 patch, Rfl: 3 .  losartan-hydrochlorothiazide (HYZAAR) 100-25 MG per tablet, Take 1 tablet by mouth daily., Disp: , Rfl:  .  meloxicam (MOBIC) 15 MG tablet, Take 15 mg by mouth daily as needed. , Disp: , Rfl:  .  nystatin-triamcinolone (MYCOLOG II) cream, Apply 1 application topically as directed., Disp: , Rfl: 0 .  ONETOUCH DELICA LANCETS FINE MISC, , Disp: , Rfl: 0 .   ONETOUCH VERIO test strip, , Disp: , Rfl: 5 .  pantoprazole (PROTONIX) 40 MG tablet, Take 1 tablet (40 mg total) by mouth daily., Disp: 90 tablet, Rfl: 0 .  pantoprazole (PROTONIX) 40 MG tablet, TAKE 1 TABLET BY MOUTH DAILY, Disp: 30 tablet, Rfl: 0 .  potassium chloride SA (K-DUR,KLOR-CON) 20 MEQ tablet, Take 40 mEq by mouth daily., Disp: , Rfl:  .  prochlorperazine (COMPAZINE) 10 MG tablet, Take 10 mg by mouth every 6 (six) hours as needed for nausea or vomiting., Disp: , Rfl:  .  sulfaSALAzine (AZULFIDINE) 500 MG EC tablet, TK 2 TS PO BID, Disp: , Rfl: 2 .  UNABLE TO FIND, Med Name: Metanex- Vitamin B, Disp: , Rfl:  .  venlafaxine (EFFEXOR) 37.5 MG tablet, Take 1 tablet (37.5 mg total) by mouth daily., Disp: 30 tablet, Rfl: 1 .  zolpidem (AMBIEN) 10 MG tablet, Take 10 mg by mouth at bedtime as needed for sleep., Disp: , Rfl:  No current facility-administered medications for this visit.  Facility-Administered Medications Ordered in Other Visits:  .  sodium chloride 0.9 % injection 10 mL, 10 mL, Intracatheter, PRN, Curt Bears, MD, 10 mL at 12/30/13 1121     Review of Systems  Constitutional: Positive for fatigue. Negative for fever, chills and unexpected weight change.  HENT: Negative for congestion, dental problem, ear pain, nosebleeds, postnasal drip, rhinorrhea, sinus pressure, sneezing, sore throat, trouble swallowing and voice change.   Eyes: Negative for visual disturbance.  Respiratory: Positive for cough and shortness of breath. Negative for choking.   Cardiovascular: Negative for chest pain and leg swelling.  Gastrointestinal: Negative for vomiting, abdominal pain and diarrhea.  Genitourinary: Negative for difficulty urinating.  Musculoskeletal: Negative for arthralgias.  Skin: Negative for rash.  Neurological: Negative for tremors, syncope and headaches.  Hematological: Does not bruise/bleed easily.       Objective:   Physical Exam  Constitutional: She is oriented to  person, place, and time. She appears well-developed and well-nourished. No distress.  Looks more deconditioned Sitting in wheel chair  HENT:  Head: Normocephalic and atraumatic.  Right Ear: External ear normal.  Left Ear: External ear normal.  Mouth/Throat: Oropharynx is clear and moist. No oropharyngeal exudate.  Eyes: Conjunctivae and EOM are normal. Pupils are equal, round, and reactive to light. Right eye exhibits no discharge. Left eye exhibits no discharge. No scleral icterus.  Neck: Normal range of motion. Neck supple. No JVD present. No tracheal deviation present. No thyromegaly present.  Cardiovascular: Normal rate, regular rhythm, normal heart sounds and intact distal pulses.  Exam reveals no gallop and no friction rub.   No murmur heard. Pulmonary/Chest: Effort normal and breath sounds normal. No respiratory distress. She has no wheezes. She has no rales. She exhibits no tenderness.  Rt costo chondral chest tenderness +  Abdominal: Soft. Bowel sounds are normal. She exhibits no distension and no mass. There is no tenderness. There is no rebound and no guarding.  Musculoskeletal: Normal range of motion. She exhibits no edema or tenderness.  Has cane  Lymphadenopathy:    She has no cervical adenopathy.  Neurological: She is alert and oriented to person, place, and time. She has normal reflexes. No cranial nerve deficit. She exhibits normal muscle tone. Coordination normal.  Skin: Skin is warm and dry. No rash noted. She is not diaphoretic. No erythema. No pallor.  Psychiatric: She has a normal mood and affect. Her behavior is normal. Judgment and thought content normal.  Vitals reviewed.   Filed Vitals:   05/06/15 1534  BP: 96/50  Pulse: 92  Weight: 134 lb 3.2 oz (60.873 kg)  SpO2: 91%         Assessment & Plan:     ICD-9-CM ICD-10-CM   1. Acute bronchitis, unspecified organism 466.0 J20.9   2. Neoplastic malignant related fatigue 780.79 R53.0   3. Low back pain  without sciatica, unspecified back pain laterality 724.2 M54.5     Try zpak  Please take prednisone 40 mg x1 day, then 30 mg x1 day, then 20 mg x1 day, then 10 mg x1 day, and then 5 mg x1 day and stop  Will let cancer team know about your back ache  Followup  6 months or  sooner if needed  Dr. Brand Males, M.D., [facility].C.P Pulmonary and Critical Care Medicine Staff Physician [facility] Pager: [phone], If no answer or between  15:00h - 7:00h: call [phone]  05/06/2015 3:59 PM

## 2015-05-07 ENCOUNTER — Ambulatory Visit (INDEPENDENT_AMBULATORY_CARE_PROVIDER_SITE_OTHER): Payer: Medicare Other | Admitting: Neurology

## 2015-05-07 ENCOUNTER — Telehealth: Payer: Self-pay | Admitting: Internal Medicine

## 2015-05-07 ENCOUNTER — Encounter: Payer: Self-pay | Admitting: Neurology

## 2015-05-07 VITALS — BP 85/57 | HR 110 | Ht 66.0 in | Wt 133.4 lb

## 2015-05-07 DIAGNOSIS — T451X5A Adverse effect of antineoplastic and immunosuppressive drugs, initial encounter: Principal | ICD-10-CM

## 2015-05-07 DIAGNOSIS — G609 Hereditary and idiopathic neuropathy, unspecified: Secondary | ICD-10-CM

## 2015-05-07 DIAGNOSIS — G620 Drug-induced polyneuropathy: Secondary | ICD-10-CM

## 2015-05-07 MED ORDER — LAMOTRIGINE ER 25 MG PO TB24: 100.0000 mg | ORAL_TABLET | Freq: Every day | ORAL | Status: DC

## 2015-05-07 MED ORDER — GABAPENTIN (ONCE-DAILY) 600 MG PO TABS: 1800.0000 mg | ORAL_TABLET | Freq: Every day | ORAL | Status: DC

## 2015-05-07 NOTE — Progress Notes (Signed)
[facility]    Provider:  Dr Jaynee Eagles Referring Provider: Anda Kraft, MD Primary Care Physician:  [name], MD  Interval History 05/07/2015: This is a wonderful 74 years old white female with history of stage IIIB non-small cell lung cancer status postcarboplatin and paclitaxel with resultant painful neuropathy.The feet are about the same. Still on the Gralise.  Lidocaine patches help somewhat. She is tolerating the Lamictal, no rashes. Discussed several options for her neuropathy, decided to slowly increase Lamictal to '50mg'$  then '75mg'$  and then '100mg'$ . This will hopefully help with her neuropathy and is a great medication for mood. Discussed at length. Ambien ishelping her sleep. Tried Lyrica before the Gralise and that did not work. The Gralise has worked the best so far.   Interval History 11/13/2014: She is feeling better. She is taking the marinol and can sleep at night. The pain is manageable during the day with the Gralise. She has a new diagnosis of diabetes. Her thyroid function is elevated. She takes daily B12. She is having some swelling in her legs which is manageable however it makes it difficult to drive. She still has tingling in the fingers.   Her doctors include  [facility].  Dr Hipolito Bayley Dr. Wilson Singer  Interval History 09/03/2014: She is still struggling with her peripheral neuropathy, nothing is helping. It is severe. Maybe the Gralise helped but then she ran out and didn't call for a prescription. She was recently given prednisone, cymbalta. Cymbalta made her symptoms worse. Started the Gralise and the 1800 mg worked. Belsomra didn't help. Ambien helps her sleep. Lyrica didn't work either for her peripheral neuropathy. Takes b12 daily and biotin. She is sleeping better with Ambien but the neuropathy is still severe. She can't fall asleep, possibly stress. Paresthesias are in the feet and hands. They occurred in the setting of  platin-based chemotherapy. They are continuous, worse at night when sleeping. She is also very emotional, crying a lot. Her appetite is very poor.  Visit 08/13/14: This is a very pleasant 74 years old white female with history of stage IIIB non-small cell lung cancer status postcarboplatin and paclitaxel. Patient reports she had chemotherapy and radiation from [place] - April 2nd. 6 weeks later started a second set of chemo, 3 sessions every 3 weeks. The neuropathy started about 3 weeks ago. Feels like burning, pins and needles and lightning strikes. Feet and left hand > right hand. It is in the entire feet to the ankles and entire left hand and digits 3-5 right hand. Severe, cramping in the feet and calfs. She is crying in the office today. Oxycodone not helping with the pain, doesn't even touch it. Didn't try Lyrica. Cymbalta worsened the symptoms. Nothing is helping. She is using a cane. No falls. Balance if off. The pain is severe, 10/10 and keeps her from sleeping at night. Symptoms are continuous and never subside, but are worse at night.  Reviewed notes, labs and imaging from outside physicians, which showed: 1) Concurrent chemoradiation with weekly chemotherapy in the form of carboplatin for AUC of 2 and paclitaxel 45 mg/m2. Status post 7 week of treatment. Last dose was given 01/27/2014 no significant response in her disease.  2) Consolidation chemotherapy with carboplatin for AUC of 5 and paclitaxel 175 mg/M2 every 3 weeks with Neulasta support. First dose on 04/15/2014. Status post 3 cycles. Carboplatin was discontinued starting cycle #2 secondary to hypersensitivity reaction.  Her restaging scan showed evidence for disease progression and the patient  is currently undergoing immunotherapy with Nivolumab 3 mg/KG every 2 weeks is status post 3 cycle  Review of Systems: Patient complains of symptoms per HPI as well as the following symptoms: Activity change, chills, fatigue, blurred vision,  cold intolerance, heat intolerance, bleed easily, cough, shortness of breath, ear pain, ringing in ears, runny nose, joint pain, back pain, aching muscles, walking difficulty, wounds. Pertinent negatives per HPI. All others negative.   History   Social History  . Marital Status: Divorced    Spouse Name: N/A  . Number of Children: 0  . Years of Education: MA   Occupational History  . Retired Other   Social History Main Topics  . Smoking status: Former Smoker -- 3.00 packs/day for 45 years    Types: Cigarettes    Quit date: 12/05/1997  . Smokeless tobacco: Never Used  . Alcohol Use: No  . Drug Use: No  . Sexual Activity: Not Currently   Other Topics Concern  . Not on file   Social History Narrative   Patient lives at home alone.   Caffeine Use: 2 cups daily    Family History  Problem Relation Age of Onset  . Hypertension Other   . Stroke Other   . Heart attack Other   . Hemachromatosis Other   . Rheum arthritis      both sets of grandparents and father  . Other Mother   . Other Father     failure to thrive    Past Medical History  Diagnosis Date  . Hypertension   . Rheumatoid arthritis   . GERD (gastroesophageal reflux disease)     no meds for  . Hx of radiation therapy 12/16/13-01/30/14    lung 66Gy  . Neuropathy   . Shortness of breath dyspnea     with exertion  . History of hiatal hernia   . Diabetes mellitus     insulin  . Cancer dx'd 09/2013    Lung ca    Past Surgical History  Procedure Laterality Date  . Cholecystectomy    . Abdominal hysterectomy    . Dilation and curettage of uterus    . Abdominal surgery    . Tonsillectomy    . Esophagogastroduodenoscopy (egd) with propofol N/A 10/09/2014    Procedure: ESOPHAGOGASTRODUODENOSCOPY (EGD) WITH PROPOFOL;  Surgeon: Inda Castle, MD;  Location: [facility];  Service: Endoscopy;  Laterality: N/A;  . Savory dilation N/A 10/09/2014    Procedure: SAVORY DILATION;  Surgeon: Inda Castle, MD;   Location: [facility];  Service: Endoscopy;  Laterality: N/A;  . Esophagogastroduodenoscopy N/A 12/12/2014    Procedure: ESOPHAGOGASTRODUODENOSCOPY (EGD);  Surgeon: Inda Castle, MD;  Location: [facility];  Service: Endoscopy;  Laterality: N/A;  with dilation  . Balloon dilation N/A 12/12/2014    Procedure: BALLOON DILATION;  Surgeon: Inda Castle, MD;  Location: [facility];  Service: Endoscopy;  Laterality: N/A;    Current Outpatient Prescriptions  Medication Sig Dispense Refill  . azithromycin (ZITHROMAX) 250 MG tablet Take as directed 6 tablet 0  . B-D ULTRAFINE III SHORT PEN 31G X 8 MM MISC   0  . dronabinol (MARINOL) 10 MG capsule Take 2 capsules (20 mg total) by mouth 2 (two) times daily before a meal. 120 capsule 3  . Fluticasone-Salmeterol (ADVAIR DISKUS) 100-50 MCG/DOSE AEPB Inhale 1 puff into the lungs 2 (two) times daily. 60 each 5  . Gabapentin, Once-Daily, (GRALISE) 600 MG TABS Take 1,800 mg by mouth daily. 90 tablet 6  .  HUMALOG KWIKPEN 100 UNIT/ML KiwkPen   0  . ibuprofen (ADVIL,MOTRIN) 600 MG tablet Take 1 tablet (600 mg total) by mouth every 6 (six) hours as needed. Take with food. 30 tablet 0  . LamoTRIgine (LAMICTAL XR) 25 MG TB24 tablet TAKE 1 TABLET BY MOUTH EVERY DAY 30 tablet 3  . LEVEMIR FLEXTOUCH 100 UNIT/ML Pen   0  . lidocaine (LIDODERM) 5 % APPLY 3 PATCHES ONTO THE SKIN ONCE DAILY. LEAVE ON FOR 12 HOURS AND OFF FOR 12 HOURS. MAY CUT PATCH TO FIT 90 patch 3  . losartan-hydrochlorothiazide (HYZAAR) 100-25 MG per tablet Take 1 tablet by mouth daily.    . meloxicam (MOBIC) 15 MG tablet Take 15 mg by mouth daily as needed.     . nystatin-triamcinolone (MYCOLOG II) cream Apply 1 application topically as directed.  0  . ONETOUCH DELICA LANCETS FINE MISC   0  . ONETOUCH VERIO test strip   5  . potassium chloride SA (K-DUR,KLOR-CON) 20 MEQ tablet Take 40 mEq by mouth daily.    . predniSONE (DELTASONE) 10 MG tablet 40 mg x1 day, then 30 mg x1 day, then 20 mg x1  day, then 10 mg x1 day, and then 5 mg x1 day and stop 11 tablet 0  . UNABLE TO FIND Med Name: Metanex- Vitamin B    . zolpidem (AMBIEN) 10 MG tablet Take 10 mg by mouth at bedtime as needed for sleep.    [name] docusate sodium (COLACE) 100 MG capsule Take 1 capsule (100 mg total) by mouth every 12 (twelve) hours. (Patient not taking: Reported on 05/07/2015) 60 capsule 0  . lansoprazole (PREVACID SOLUTAB) 30 MG disintegrating tablet Take 1 tablet (30 mg total) by mouth daily. (Patient not taking: Reported on 05/07/2015) 90 tablet 3  . pantoprazole (PROTONIX) 40 MG tablet Take 1 tablet (40 mg total) by mouth daily. (Patient not taking: Reported on 05/07/2015) 90 tablet 0  . pantoprazole (PROTONIX) 40 MG tablet TAKE 1 TABLET BY MOUTH DAILY 30 tablet 0  . prochlorperazine (COMPAZINE) 10 MG tablet Take 10 mg by mouth every 6 (six) hours as needed for nausea or vomiting.    . sulfaSALAzine (AZULFIDINE) 500 MG EC tablet TK 2 TS PO BID  2  . venlafaxine (EFFEXOR) 37.5 MG tablet Take 1 tablet (37.5 mg total) by mouth daily. (Patient not taking: Reported on 05/07/2015) 30 tablet 1   No current facility-administered medications for this visit.   Facility-Administered Medications Ordered in Other Visits  Medication Dose Route Frequency Provider Last Rate Last Dose  . sodium chloride 0.9 % injection 10 mL  10 mL Intracatheter PRN Curt Bears, MD   10 mL at 12/30/13 1121    Allergies as of 05/07/2015 - Review Complete 05/06/2015  Allergen Reaction Noted  . Remicade [infliximab] Anaphylaxis 05/13/2012  . Carboplatin  05/29/2014  . Hydromorphone Rash 08/11/2014    Vitals: BP 85/57 mmHg  Pulse 110  Ht '5\' 6"'$  (1.676 m)  Wt 133 lb 6.4 oz (60.51 kg)  BMI 21.54 kg/m2 Last Weight:  Wt Readings from Last 1 Encounters:  05/07/15 133 lb 6.4 oz (60.51 kg)   Last Height:   Ht Readings from Last 1 Encounters:  05/07/15 '5\' 6"'$  (1.676 m)   Gait:  Ataxic, wide based, using a cane  Motor Observation:  Distal  atrophy most significant in the FDI  Tone:  Normal muscle tone.   Posture:  Posture is normal. normal erect   Strength:  Mild weakness in  the distal extremities. Strength is V/V in the upper and lower limbs.   Sensation: Absent vibration and proprioception in the distal extremities. Impaired pp and temp in the extremities. Allodynia bottom of the feet. +Romberg.   Reflex Exam: Absent ankle jerks   Assessment/Plan: This is a wonderful 74 year old white female with history of stage IIIB non-small cell lung cancer status postcarboplatin and paclitaxel with resultant severe neuropathy. Exam shows severe loss of sensation in the distal extremities, +Romberg, Ataxic gait and Allodynia. Very distressing to patient, . Keeps her up at night with pain. Gralise has helped with the pain during the day.   1. Will continue Gralise '1800mg'$  daily 2. We'll continue the lidocaine topical and patches. 3. Will increase the Lamictal, discussed side effects and stop immediately for rash 4. Effexor is also an excellent medication for neuropathy, encouraged continued use 5. 3 months follow up or sooner if needed  Sarina Ill, MD  [facility] [address] [place], [place] [address]  Phone [phone] Fax [phone]  A total of *30 minutes was spent face-to-face with this patient. Over half this time was spent on counseling patient on the neuropathy diagnosis and different diagnostic and therapeutic options available.

## 2015-05-07 NOTE — Telephone Encounter (Signed)
pt called to confirm ct...done...pt ok and aware

## 2015-05-07 NOTE — Patient Instructions (Signed)
Overall you are doing fairly well but I do want to suggest a few things today:   Remember to drink plenty of fluid, eat healthy meals and do not skip any meals. Try to eat protein with a every meal and eat a healthy snack such as fruit or nuts in between meals. Try to keep a regular sleep-wake schedule and try to exercise daily, particularly in the form of walking, 20-30 minutes a day, if you can.   As far as your medications are concerned, I would like to suggest: Increase Lamictal XR to '50mg'$  in the evenings for 2-3 weeks Then increase to '75mg'$  in the evenings for 2 weeks Then increase to '100mg'$  in the evenings Stop for any rash  I would like to see you back in 3 months, sooner if we need to. Please call us with any interim questions, concerns, problems, updates or refill requests.   Please also call us for any test results so we can go over those with you on the phone.  My clinical assistant and will answer any of your questions and relay your messages to me and also relay most of my messages to you.   Our phone number is [phone]. We also have an after hours call service for urgent matters and there is a physician on-call for urgent questions. For any emergencies you know to call 911 or go to the nearest emergency room

## 2015-05-08 ENCOUNTER — Ambulatory Visit (HOSPITAL_COMMUNITY)
Admission: RE | Admit: 2015-05-08 | Discharge: 2015-05-08 | Disposition: A | Discharge status: Home / Self Care | Payer: Medicare Other | Source: Ambulatory Visit | Attending: Physician Assistant | Admitting: Physician Assistant

## 2015-05-08 ENCOUNTER — Encounter (HOSPITAL_COMMUNITY): Payer: Self-pay

## 2015-05-08 DIAGNOSIS — Z79899 Other long term (current) drug therapy: Secondary | ICD-10-CM | POA: Insufficient documentation

## 2015-05-08 DIAGNOSIS — C3491 Malignant neoplasm of unspecified part of right bronchus or lung: Secondary | ICD-10-CM | POA: Diagnosis not present

## 2015-05-08 MED ORDER — IOHEXOL 300 MG/ML  SOLN
100.0000 mL | Freq: Once | INTRAMUSCULAR | Status: AC | PRN
  Administered 2015-05-08: 80 mL via INTRAVENOUS

## 2015-05-12 ENCOUNTER — Encounter: Payer: Self-pay | Admitting: Oncology

## 2015-05-12 ENCOUNTER — Ambulatory Visit (HOSPITAL_BASED_OUTPATIENT_CLINIC_OR_DEPARTMENT_OTHER): Payer: Medicare Other | Admitting: Oncology

## 2015-05-12 ENCOUNTER — Ambulatory Visit (HOSPITAL_BASED_OUTPATIENT_CLINIC_OR_DEPARTMENT_OTHER): Payer: Medicare Other

## 2015-05-12 ENCOUNTER — Other Ambulatory Visit (HOSPITAL_BASED_OUTPATIENT_CLINIC_OR_DEPARTMENT_OTHER): Payer: Medicare Other

## 2015-05-12 ENCOUNTER — Telehealth: Payer: Self-pay | Admitting: Internal Medicine

## 2015-05-12 VITALS — BP 109/60 | HR 107 | Temp 97.8°F | Resp 18 | Ht 66.0 in | Wt 133.3 lb

## 2015-05-12 DIAGNOSIS — C342 Malignant neoplasm of middle lobe, bronchus or lung: Secondary | ICD-10-CM

## 2015-05-12 DIAGNOSIS — C3491 Malignant neoplasm of unspecified part of right bronchus or lung: Secondary | ICD-10-CM

## 2015-05-12 DIAGNOSIS — Z79899 Other long term (current) drug therapy: Secondary | ICD-10-CM | POA: Diagnosis not present

## 2015-05-12 DIAGNOSIS — Z5112 Encounter for antineoplastic immunotherapy: Secondary | ICD-10-CM

## 2015-05-12 DIAGNOSIS — M069 Rheumatoid arthritis, unspecified: Secondary | ICD-10-CM | POA: Diagnosis not present

## 2015-05-12 LAB — CBC WITH DIFFERENTIAL/PLATELET
BASO%: 0.2 % (ref 0.0–2.0)
Basophils Absolute: 0 10*3/uL (ref 0.0–0.1)
EOS ABS: 0 10*3/uL (ref 0.0–0.5)
EOS%: 0 % (ref 0.0–7.0)
HCT: 36.4 % (ref 34.8–46.6)
HEMOGLOBIN: 12.1 g/dL (ref 11.6–15.9)
LYMPH#: 1.7 10*3/uL (ref 0.9–3.3)
LYMPH%: 28.8 % (ref 14.0–49.7)
MCH: 30 pg (ref 25.1–34.0)
MCHC: 33.2 g/dL (ref 31.5–36.0)
MCV: 90.3 fL (ref 79.5–101.0)
MONO#: 0.7 10*3/uL (ref 0.1–0.9)
MONO%: 12.5 % (ref 0.0–14.0)
NEUT#: 3.4 10*3/uL (ref 1.5–6.5)
NEUT%: 58.5 % (ref 38.4–76.8)
Platelets: 273 10*3/uL (ref 145–400)
RBC: 4.03 10*6/uL (ref 3.70–5.45)
RDW: 13.6 % (ref 11.2–14.5)
WBC: 5.8 10*3/uL (ref 3.9–10.3)

## 2015-05-12 LAB — COMPREHENSIVE METABOLIC PANEL (CC13)
ALT: 21 U/L (ref 0–55)
AST: 13 U/L (ref 5–34)
Albumin: 3.6 g/dL (ref 3.5–5.0)
Alkaline Phosphatase: 133 U/L (ref 40–150)
Anion Gap: 9 mEq/L (ref 3–11)
BILIRUBIN TOTAL: 0.54 mg/dL (ref 0.20–1.20)
BUN: 20.5 mg/dL (ref 7.0–26.0)
CO2: 27 mEq/L (ref 22–29)
CREATININE: 0.8 mg/dL (ref 0.6–1.1)
Calcium: 9.3 mg/dL (ref 8.4–10.4)
Chloride: 100 mEq/L (ref 98–109)
EGFR: 71 mL/min/{1.73_m2} — ABNORMAL LOW (ref >90)
Glucose: 139 mg/dl (ref 70–140)
Potassium: 4.2 mEq/L (ref 3.5–5.1)
Sodium: 136 mEq/L (ref 136–145)
TOTAL PROTEIN: 6 g/dL — AB (ref 6.4–8.3)

## 2015-05-12 LAB — TSH CHCC: TSH: 8.787 m(IU)/L — ABNORMAL HIGH (ref 0.308–3.960)

## 2015-05-12 LAB — TECHNOLOGIST REVIEW

## 2015-05-12 MED ORDER — SODIUM CHLORIDE 0.9 % IV SOLN
Freq: Once | INTRAVENOUS | Status: AC
  Administered 2015-05-12: 12:00:00 via INTRAVENOUS

## 2015-05-12 MED ORDER — HEPARIN SOD (PORK) LOCK FLUSH 100 UNIT/ML IV SOLN
500.0000 [IU] | Freq: Once | INTRAVENOUS | Status: AC | PRN
  Administered 2015-05-12: 500 [IU]
  Filled 2015-05-12: qty 5

## 2015-05-12 MED ORDER — SODIUM CHLORIDE 0.9 % IV SOLN
3.0000 mg/kg | Freq: Once | INTRAVENOUS | Status: AC
  Administered 2015-05-12: 180 mg via INTRAVENOUS
  Filled 2015-05-12: qty 18

## 2015-05-12 MED ORDER — SODIUM CHLORIDE 0.9 % IJ SOLN
10.0000 mL | INTRAMUSCULAR | Status: DC | PRN
  Administered 2015-05-12: 10 mL
  Filled 2015-05-12: qty 10

## 2015-05-12 NOTE — Patient Instructions (Signed)
[facility] Discharge Instructions for Patients Receiving Chemotherapy  Today you received the following chemotherapy agents nivolumab   To help prevent nausea and vomiting after your treatment, we encourage you to take your nausea medication as directed   If you develop nausea and vomiting that is not controlled by your nausea medication, call the clinic.   BELOW ARE SYMPTOMS THAT SHOULD BE REPORTED IMMEDIATELY:  *FEVER GREATER THAN 100.5 F  *CHILLS WITH OR WITHOUT FEVER  NAUSEA AND VOMITING THAT IS NOT CONTROLLED WITH YOUR NAUSEA MEDICATION  *UNUSUAL SHORTNESS OF BREATH  *UNUSUAL BRUISING OR BLEEDING  TENDERNESS IN MOUTH AND THROAT WITH OR WITHOUT PRESENCE OF ULCERS  *URINARY PROBLEMS  *BOWEL PROBLEMS  UNUSUAL RASH Items with * indicate a potential emergency and should be followed up as soon as possible.  Feel free to call the clinic you have any questions or concerns. The clinic phone number is [phone].  

## 2015-05-12 NOTE — Progress Notes (Signed)
[place] Telephone:([phone]   [phone]  OFFICE PROGRESS NOTE  [name], MD [address] [place] [address] [place] [address]  DIAGNOSIS: Squamous cell lung cancer  Primary site: Lung (Right)  Staging method: AJCC 7th Edition  Clinical: Stage IIIB (T3, N3, M0)  Summary: Stage IIIB (T3, N3, M0)   PRIOR THERAPY:  1) Concurrent chemoradiation with weekly chemotherapy in the form of carboplatin for AUC of 2 and paclitaxel 45 mg/m2. Status post 7 week of treatment. Last dose was given 01/27/2014 no significant response in her disease.  2) Consolidation chemotherapy with carboplatin for AUC of 5 and paclitaxel 175 mg/M2 every 3 weeks with Neulasta support. First dose on 04/15/2014. Status post 3 cycles. Carboplatin was discontinued starting cycle #2 secondary to hypersensitivity reaction.  CURRENT THERAPY: Immunotherapy with Opdivo (Nivolumab) '3mg'$ /kg given every 2 weeks. Status post 21 cycles.  CHEMOTHERAPY INTENT: Control/Palliative  CURRENT # OF CHEMOTHERAPY CYCLES: 22 CURRENT ANTIEMETICS: Zofran, dexamethasone, Compazine  CURRENT SMOKING STATUS: Former smoker, quit 12/05/1997  ORAL CHEMOTHERAPY AND CONSENT: n/a   CURRENT BISPHOSPHONATES USE: None  PAIN MANAGEMENT: no cancer related pain, patient does have RA  NARCOTICS INDUCED CONSTIPATION: none  LIVING WILL AND CODE STATUS: ?   INTERVAL HISTORY: Megan [name] 74 y.o. female returns to the clinic today for followup visit. Patient reports increased pain to her hips, back, and knees. Pain is worse at night and she is try taking ibuprofen, Tylenol, and a muscle relaxer at bedtime. Given her history of rheumatoid arthritis she has called and left a message for her rheumatologist to discuss treatment. She would like to discuss resuming Humira with [place] today. She denies any episodes of diarrhea. She voiced no other specific complaints except for the persistent peripheral neuropathy.  She  says that her neuropathy is better with the change in the medication. The patient denied having any significant chest pain but has shortness of breath with exertion with no cough or hemoptysis. She denied skin rash or diarrhea. She denied having any significant weight loss or night sweats. She has no nausea or vomiting.  She denied any skin rash. She had a recent CT scan and is here to discuss the results. She is today to start cycle #22 of her treatment with Nivolumab.   MEDICAL HISTORY: Past Medical History  Diagnosis Date  . Hypertension   . Rheumatoid arthritis   . GERD (gastroesophageal reflux disease)     no meds for  . Hx of radiation therapy 12/16/13-01/30/14    lung 66Gy  . Neuropathy   . Shortness of breath dyspnea     with exertion  . History of hiatal hernia   . Diabetes mellitus     insulin  . Cancer dx'd 09/2013    Lung ca    ALLERGIES:  is allergic to remicade; carboplatin; and hydromorphone.  MEDICATIONS:  Current Outpatient Prescriptions  Medication Sig Dispense Refill  . azithromycin (ZITHROMAX) 250 MG tablet Take as directed 6 tablet 0  . B-D ULTRAFINE III SHORT PEN 31G X 8 MM MISC   0  . docusate sodium (COLACE) 100 MG capsule Take 1 capsule (100 mg total) by mouth every 12 (twelve) hours. 60 capsule 0  . dronabinol (MARINOL) 10 MG capsule Take 2 capsules (20 mg total) by mouth 2 (two) times daily before a meal. 120 capsule 3  . Fluticasone-Salmeterol (ADVAIR DISKUS) 100-50 MCG/DOSE AEPB Inhale 1 puff into the lungs 2 (two) times daily. [place]  each 5  . Gabapentin, Once-Daily, (GRALISE) 600 MG TABS Take 1,800 mg by mouth daily. 90 tablet 11  . HUMALOG KWIKPEN 100 UNIT/ML KiwkPen   0  . ibuprofen (ADVIL,MOTRIN) 600 MG tablet Take 1 tablet (600 mg total) by mouth every 6 (six) hours as needed. Take with food. 30 tablet 0  . LamoTRIgine (LAMICTAL XR) 25 MG TB24 tablet Take 4 tablets (100 mg total) by mouth daily. Start with 2 pills at night for 2 weeks. Than increase to 4  pills at night. Stop for rash. 120 tablet 11  . lansoprazole (PREVACID SOLUTAB) 30 MG disintegrating tablet Take 1 tablet (30 mg total) by mouth daily. 90 tablet 3  . LEVEMIR FLEXTOUCH 100 UNIT/ML Pen   0  . lidocaine (LIDODERM) 5 % APPLY 3 PATCHES ONTO THE SKIN ONCE DAILY. LEAVE ON FOR 12 HOURS AND OFF FOR 12 HOURS. MAY CUT PATCH TO FIT 90 patch 3  . losartan-hydrochlorothiazide (HYZAAR) 100-25 MG per tablet Take 1 tablet by mouth daily.    . meloxicam (MOBIC) 15 MG tablet Take 15 mg by mouth daily as needed.     . nystatin-triamcinolone (MYCOLOG II) cream Apply 1 application topically as directed.  0  . ONETOUCH DELICA LANCETS FINE MISC   0  . ONETOUCH VERIO test strip   5  . pantoprazole (PROTONIX) 40 MG tablet Take 1 tablet (40 mg total) by mouth daily. 90 tablet 0  . pantoprazole (PROTONIX) 40 MG tablet TAKE 1 TABLET BY MOUTH DAILY 30 tablet 0  . potassium chloride SA (K-DUR,KLOR-CON) 20 MEQ tablet Take 40 mEq by mouth daily.    . predniSONE (DELTASONE) 10 MG tablet 40 mg x1 day, then 30 mg x1 day, then 20 mg x1 day, then 10 mg x1 day, and then 5 mg x1 day and stop 11 tablet 0  . prochlorperazine (COMPAZINE) 10 MG tablet Take 10 mg by mouth every 6 (six) hours as needed for nausea or vomiting.    . sulfaSALAzine (AZULFIDINE) 500 MG EC tablet TK 2 TS PO BID  2  . UNABLE TO FIND Med Name: Metanex- Vitamin B    . venlafaxine (EFFEXOR) 37.5 MG tablet Take 1 tablet (37.5 mg total) by mouth daily. 30 tablet 1  . zolpidem (AMBIEN) 10 MG tablet Take 10 mg by mouth at bedtime as needed for sleep.     No current facility-administered medications for this visit.   Facility-Administered Medications Ordered in Other Visits  Medication Dose Route Frequency Provider Last Rate Last Dose  . sodium chloride 0.9 % injection 10 mL  10 mL Intracatheter PRN Curt Bears, MD   10 mL at 12/30/13 1121  . sodium chloride 0.9 % injection 10 mL  10 mL Intracatheter PRN Curt Bears, MD   10 mL at 05/12/15  1411    SURGICAL HISTORY:  Past Surgical History  Procedure Laterality Date  . Cholecystectomy    . Abdominal hysterectomy    . Dilation and curettage of uterus    . Abdominal surgery    . Tonsillectomy    . Esophagogastroduodenoscopy (egd) with propofol N/A 10/09/2014    Procedure: ESOPHAGOGASTRODUODENOSCOPY (EGD) WITH PROPOFOL;  Surgeon: Inda Castle, MD;  Location: [facility];  Service: Endoscopy;  Laterality: N/A;  . Savory dilation N/A 10/09/2014    Procedure: SAVORY DILATION;  Surgeon: Inda Castle, MD;  Location: [facility];  Service: Endoscopy;  Laterality: N/A;  . Esophagogastroduodenoscopy N/A 12/12/2014    Procedure: ESOPHAGOGASTRODUODENOSCOPY (EGD);  Surgeon: Herbie [place]  Shaaron Adler, MD;  Location: [facility];  Service: Endoscopy;  Laterality: N/A;  with dilation  . Balloon dilation N/A 12/12/2014    Procedure: BALLOON DILATION;  Surgeon: Inda Castle, MD;  Location: [facility];  Service: Endoscopy;  Laterality: N/A;    REVIEW OF SYSTEMS:  Constitutional: positive for fatigue Eyes: negative Ears, nose, mouth, throat, and face: negative Respiratory: negative Cardiovascular: negative Gastrointestinal: negative Genitourinary:negative Integument/breast: negative Hematologic/lymphatic: negative Musculoskeletal:positive for bone pain Neurological: positive for paresthesia Behavioral/Psych: negative Endocrine: negative Allergic/Immunologic: negative   PHYSICAL EXAMINATION: General appearance: alert, cooperative and no distress Head: Normocephalic, without obvious abnormality, atraumatic Neck: no adenopathy, no JVD, supple, symmetrical, trachea midline and thyroid not enlarged, symmetric, no tenderness/mass/nodules Lymph nodes: Cervical, supraclavicular, and axillary nodes normal. Resp: clear to auscultation bilaterally Back: symmetric, no curvature. ROM normal. No CVA tenderness. Cardio: regular rate and rhythm, S1, S2 normal, no murmur, click, rub or gallop GI:  soft, non-tender; bowel sounds normal; no masses,  no organomegaly Extremities: extremities normal, atraumatic, no cyanosis or edema Neurologic: Alert and oriented X 3, normal strength and tone. Normal symmetric reflexes. Normal coordination and gait  ECOG PERFORMANCE STATUS: 1 - Symptomatic but completely ambulatory  Blood pressure 109/60, pulse 107, temperature 97.8 F (36.6 C), temperature source Oral, resp. rate 18, height '5\' 6"'$  (1.676 m), weight 133 lb 4.8 oz (60.464 kg), SpO2 99 %.  LABORATORY DATA: Lab Results  Component Value Date   WBC 5.8 05/12/2015   HGB 12.1 05/12/2015   HCT 36.4 05/12/2015   MCV 90.3 05/12/2015   PLT 273 05/12/2015      Chemistry      Component Value Date/Time   NA 136 05/12/2015 1010   NA 138 10/09/2014 1328   K 4.2 05/12/2015 1010   K 3.7 10/09/2014 1328   CL 92* 09/08/2014 1417   CO2 27 05/12/2015 1010   CO2 28 09/08/2014 1417   BUN 20.5 05/12/2015 1010   BUN 15 09/08/2014 1417   CREATININE 0.8 05/12/2015 1010   CREATININE 0.67 09/08/2014 1417   GLU 299* 02/02/2015 1348      Component Value Date/Time   CALCIUM 9.3 05/12/2015 1010   CALCIUM 9.6 09/08/2014 1417   ALKPHOS 133 05/12/2015 1010   ALKPHOS 118* 09/08/2014 1417   AST 13 05/12/2015 1010   AST 17 09/08/2014 1417   ALT 21 05/12/2015 1010   ALT 25 09/08/2014 1417   BILITOT 0.54 05/12/2015 1010   BILITOT 0.4 09/08/2014 1417       RADIOGRAPHIC STUDIES: Ct Chest W Contrast  05/08/2015   CLINICAL DATA:  Squamous cell carcinoma of right lung; diagnosed 12/14. Radiation therapy completed 4/15. On maintenance chemotherapy.  EXAM: CT CHEST WITH CONTRAST  TECHNIQUE: Multidetector CT imaging of the chest was performed during intravenous contrast administration.  CONTRAST:  50m OMNIPAQUE IOHEXOL 300 MG/ML  SOLN  COMPARISON:  03/09/2015 and 12/19/2014.  FINDINGS: Mediastinum/Nodes: No supraclavicular adenopathy. Aortic atherosclerosis. Normal heart size, without pericardial effusion. LAD  coronary artery atherosclerosis. No central pulmonary embolism, on this non-dedicated study.  No well-defined mediastinal adenopathy. There is soft tissue density within the mediastinum, including the subcarinal station. Example image 25. This is similar. Soft tissue fullness about the right hilum, including on image 22 of series 2 is felt to be similar (radiation induced). No left hilar adenopathy.  Mild mediastinal shift to the right.  Lungs/Pleura: No pleural fluid. Similar narrowing of the right upper lobe bronchus. Right middle lobe bronchus is obstructed or collapse, similar.  Centrilobular emphysema. Volume loss in the medial rib right lower lobe is similar.  Calcified granuloma in the left lower lobe laterally on image 41.  The left lower lobe 6 mm nodule described on the prior exam is resolved. There is a subpleural 2 mm left lower lobe nodule on image 44 which is similar.  Favor minimal atelectasis in the dependent left lower lobe on image 30.  Redemonstration of mild volume loss and consolidation involving the medial right upper and right middle lobes. This measures maximally 1.3 cm on image 22 of series 2 versus 1.7 cm at the same level on the prior. The previously described central hypoattenuation is no longer identified.  Upper abdomen: Mild hepatic steatosis. Hepatic cysts. Old granulomatous disease in the spleen. Normal imaged portions of the stomach, pancreas, adrenal glands, kidneys. Cholecystectomy.  Musculoskeletal: No acute osseous abnormality.  IMPRESSION: 1. Volume loss in the right hemi thorax with decrease in medial right upper and right middle lobe consolidation. Favored to be primarily treatment related. No well-defined residual or recurrent disease. 2. The left lower lobe suspicious pulmonary nodule on the prior exam has resolved, favoring an infectious etiology. As the patient is currently undergoing chemotherapy, response to therapy of a pulmonary metastasis possible but felt less  likely. 3. No well-defined thoracic adenopathy. Increased density within the mediastinum is favored to be radiation induced and is not significantly changed. Recommend attention on follow-up. 4. Hepatic steatosis.   Electronically Signed   By: Abigail Miyamoto M.D.   On: 05/08/2015 10:13     ASSESSMENT AND PLAN: This is a very pleasant 74 year old white female with history of stage IIIB non-small cell lung cancer status post a course of concurrent chemoradiation with weekly carboplatin and paclitaxel status post 7 cycles. The patient tolerated her treatment fairly well with no significant adverse effects except for the radiation induced esophagitis.  She was also treated with 3 cycles of consolidation chemotherapy with carboplatin and paclitaxel but carboplatin was discontinued the start of cycle #2 secondary to hypersensitivity reaction. Her restaging scan showed evidence for disease progression and the patient is currently undergoing immunotherapy with Nivolumab 3 mg/KG every 2 weeks is status post 22 cycles.  The patient was seen and discussed with Dr. Julien Nordmann. CT scan results showed stable disease. Discussed her desire to resume Humira for her rheumatoid arthritis. She will interaction between Humira and Nivolumab was discussed. The patient was given the option of stopping Nivolumab to treat her rheumatoid arthritis symptoms with Humira versus continuing on with Nivolumab since her disease has remained stable on this disease. The patient were to opt to stop Nivolumab that we would consider possible chemotherapy for her. At this time, the patient like to continue the Nivolumab. Recommend that she continue to use ibuprofen or Aleve to help with her aches and pains and to discuss her rheumatoid arthritis management further with her rheumatologist.   She will follow-up  in 2 weeks for reevaluation before starting the next dose of her Nivolumab, cycle #23. When she returns in 2 weeks.   She was advised to  call immediately if she has any concerning symptoms in the interval. The patient voices understanding of current disease status and treatment options and is in agreement with the current care plan.  All questions were answered. The patient knows to call the clinic with any problems, questions or concerns. We can certainly see the patient much sooner if necessary.  Mikey Bussing, DNP, AGPCNP-BC, AOCNP  05/12/2015  05/12/2015  ADDENDUM: Hematology/Oncology Attending: I had a face to face encounter with the patient. I recommended her care plan. This is a very pleasant 74 years old white female with metastatic non-small cell lung cancer, squamous cell carcinoma who is currently undergoing treatment with immunotherapy with Nivolumab status post 21 cycles. The patient is tolerating her treatment well but recently has been complaining of flare of her rheumatoid arthritis as well as shortness of breath. Her rheumatologist is considering treatment with Humira for the rheumatoid arthritis. I'm not sure about the interaction with her current immunotherapy and I advised the patient to discuss with her rheumatologist other alternatives. She was seen recently by Dr. Chase Caller for the shortness of breath and he gave her a short course of prednisone that was tapered quickly. Her recent CT scan of the chest showed stable disease. I discussed the scan results with the patient and recommended for her to continue her current treatment with Nivolumab. I also discussed with the patient discontinuing her treatment with Nivolumab in case she would like to start Humira for her rheumatoid arthritis. She is not interested in this option. The patient will proceed with cycle #22 today as scheduled. She will come back for follow-up visit in 2 weeks with the next cycle of her treatment. The patient was advised to call immediately if she has any concerning symptoms in the interval.  Disclaimer: This note was dictated with  voice recognition software. Similar sounding words can inadvertently be transcribed and may be missed upon review. Eilleen Kempf., MD 05/13/2015

## 2015-05-12 NOTE — Telephone Encounter (Signed)
Gave and printed appt sched and avs for pt for July and AUG

## 2015-05-13 ENCOUNTER — Telehealth: Payer: Self-pay | Admitting: Gastroenterology

## 2015-05-13 NOTE — Telephone Encounter (Signed)
Doc of the Day This patient transferred her care from Dr Hilarie Fredrickson to Dr Deatra Ina a year ago. She said "he cut me for the EGD, he didn't call in my prescription, and he never made a follow up with me" "I don't want to see him". Now she doesn't want to see Deatra Ina. Wants to switch doctors to first available. States that she did not like her procedure experience with Dr. Deatra Ina. What do I do?

## 2015-05-13 NOTE — Telephone Encounter (Signed)
I am not able to accommodate her request to transfer care

## 2015-05-14 NOTE — Telephone Encounter (Signed)
no

## 2015-05-14 NOTE — Telephone Encounter (Signed)
Do I send this to each doctor to see if anyone will take her?

## 2015-05-14 NOTE — Telephone Encounter (Signed)
Patient wants to change providers. See the other notes for this encounter.

## 2015-05-26 ENCOUNTER — Ambulatory Visit: Payer: Medicare Other

## 2015-05-26 ENCOUNTER — Telehealth: Payer: Self-pay | Admitting: Physician Assistant

## 2015-05-26 ENCOUNTER — Telehealth: Payer: Self-pay | Admitting: *Deleted

## 2015-05-26 ENCOUNTER — Other Ambulatory Visit (HOSPITAL_BASED_OUTPATIENT_CLINIC_OR_DEPARTMENT_OTHER): Payer: Medicare Other

## 2015-05-26 ENCOUNTER — Encounter: Payer: Self-pay | Admitting: Internal Medicine

## 2015-05-26 ENCOUNTER — Ambulatory Visit (HOSPITAL_BASED_OUTPATIENT_CLINIC_OR_DEPARTMENT_OTHER): Payer: Medicare Other

## 2015-05-26 ENCOUNTER — Ambulatory Visit (HOSPITAL_BASED_OUTPATIENT_CLINIC_OR_DEPARTMENT_OTHER): Payer: Medicare Other | Admitting: Internal Medicine

## 2015-05-26 VITALS — BP 114/75 | HR 106 | Temp 97.7°F | Resp 17 | Ht 66.0 in | Wt 132.0 lb

## 2015-05-26 DIAGNOSIS — C342 Malignant neoplasm of middle lobe, bronchus or lung: Secondary | ICD-10-CM

## 2015-05-26 DIAGNOSIS — M069 Rheumatoid arthritis, unspecified: Secondary | ICD-10-CM

## 2015-05-26 DIAGNOSIS — Z79899 Other long term (current) drug therapy: Secondary | ICD-10-CM | POA: Diagnosis not present

## 2015-05-26 DIAGNOSIS — Z5112 Encounter for antineoplastic immunotherapy: Secondary | ICD-10-CM

## 2015-05-26 DIAGNOSIS — C3491 Malignant neoplasm of unspecified part of right bronchus or lung: Secondary | ICD-10-CM

## 2015-05-26 LAB — COMPREHENSIVE METABOLIC PANEL (CC13)
ALT: 18 U/L (ref 0–55)
ANION GAP: 9 meq/L (ref 3–11)
AST: 14 U/L (ref 5–34)
Albumin: 3.4 g/dL — ABNORMAL LOW (ref 3.5–5.0)
Alkaline Phosphatase: 136 U/L (ref 40–150)
BUN: 15.1 mg/dL (ref 7.0–26.0)
CALCIUM: 9 mg/dL (ref 8.4–10.4)
CO2: 24 mEq/L (ref 22–29)
Chloride: 105 mEq/L (ref 98–109)
Creatinine: 0.8 mg/dL (ref 0.6–1.1)
EGFR: 79 mL/min/{1.73_m2} — ABNORMAL LOW (ref >90)
Glucose: 101 mg/dl (ref 70–140)
POTASSIUM: 3.6 meq/L (ref 3.5–5.1)
SODIUM: 138 meq/L (ref 136–145)
TOTAL PROTEIN: 6.1 g/dL — AB (ref 6.4–8.3)
Total Bilirubin: 0.57 mg/dL (ref 0.20–1.20)

## 2015-05-26 LAB — CBC WITH DIFFERENTIAL/PLATELET
BASO%: 0.4 % (ref 0.0–2.0)
BASOS ABS: 0 10*3/uL (ref 0.0–0.1)
EOS%: 0 % (ref 0.0–7.0)
Eosinophils Absolute: 0 10*3/uL (ref 0.0–0.5)
HCT: 38.8 % (ref 34.8–46.6)
HGB: 12.8 g/dL (ref 11.6–15.9)
LYMPH%: 21.9 % (ref 14.0–49.7)
MCH: 30.3 pg (ref 25.1–34.0)
MCHC: 33 g/dL (ref 31.5–36.0)
MCV: 91.7 fL (ref 79.5–101.0)
MONO#: 0.5 10*3/uL (ref 0.1–0.9)
MONO%: 16.7 % — AB (ref 0.0–14.0)
NEUT#: 1.9 10*3/uL (ref 1.5–6.5)
NEUT%: 61 % (ref 38.4–76.8)
PLATELETS: 147 10*3/uL (ref 145–400)
RBC: 4.23 10*6/uL (ref 3.70–5.45)
RDW: 15.8 % — AB (ref 11.2–14.5)
WBC: 3.2 10*3/uL — AB (ref 3.9–10.3)
lymph#: 0.7 10*3/uL — ABNORMAL LOW (ref 0.9–3.3)

## 2015-05-26 LAB — TECHNOLOGIST REVIEW

## 2015-05-26 LAB — TSH CHCC: TSH: 4.519 m(IU)/L — ABNORMAL HIGH (ref 0.308–3.960)

## 2015-05-26 MED ORDER — SODIUM CHLORIDE 0.9 % IV SOLN
3.0000 mg/kg | Freq: Once | INTRAVENOUS | Status: AC
  Administered 2015-05-26: 180 mg via INTRAVENOUS
  Filled 2015-05-26: qty 18

## 2015-05-26 MED ORDER — SODIUM CHLORIDE 0.9 % IV SOLN: Freq: Once | INTRAVENOUS | Status: DC

## 2015-05-26 NOTE — Telephone Encounter (Signed)
Pt confirmed labs/ov per 07/26 POF, gave pt AVS and Calendar.[name] Banas, sent msg to add chemo

## 2015-05-26 NOTE — Telephone Encounter (Signed)
Per staff message and POF I have scheduled appts. Advised scheduler of appts. JMW  

## 2015-05-26 NOTE — Patient Instructions (Signed)
[facility] Discharge Instructions for Patients Receiving Chemotherapy  Today you received the following chemotherapy agents: Nivolumab.  To help prevent nausea and vomiting after your treatment, we encourage you to take your nausea medication: compazine 10 mg every 6 hours as needed.   If you develop nausea and vomiting that is not controlled by your nausea medication, call the clinic.   BELOW ARE SYMPTOMS THAT SHOULD BE REPORTED IMMEDIATELY:  *FEVER GREATER THAN 100.5 F  *CHILLS WITH OR WITHOUT FEVER  NAUSEA AND VOMITING THAT IS NOT CONTROLLED WITH YOUR NAUSEA MEDICATION  *UNUSUAL SHORTNESS OF BREATH  *UNUSUAL BRUISING OR BLEEDING  TENDERNESS IN MOUTH AND THROAT WITH OR WITHOUT PRESENCE OF ULCERS  *URINARY PROBLEMS  *BOWEL PROBLEMS  UNUSUAL RASH Items with * indicate a potential emergency and should be followed up as soon as possible.  Feel free to call the clinic you have any questions or concerns. The clinic phone number is [phone].  Please show the [facility] at check-in to the Emergency Department and triage nurse.

## 2015-05-26 NOTE — Progress Notes (Signed)
[facility] [phone]   [phone]  OFFICE PROGRESS NOTE  [name], MD [address] [place] [address] [place] [address]  DIAGNOSIS: Squamous cell lung cancer  Primary site: Lung (Right)  Staging method: AJCC 7th Edition  Clinical: Stage IIIB (T3, N3, M0)  Summary: Stage IIIB (T3, N3, M0)   PRIOR THERAPY:  1) Concurrent chemoradiation with weekly chemotherapy in the form of carboplatin for AUC of 2 and paclitaxel 45 mg/m2. Status post 7 week of treatment. Last dose was given 01/27/2014 no significant response in her disease.  2) Consolidation chemotherapy with carboplatin for AUC of 5 and paclitaxel 175 mg/M2 every 3 weeks with Neulasta support. First dose on 04/15/2014. Status post 3 cycles. Carboplatin was discontinued starting cycle #2 secondary to hypersensitivity reaction.  CURRENT THERAPY: Immunotherapy with Opdivo (Nivolumab) '3mg'$ /kg given every 2 weeks. Status post 22 cycles.  CHEMOTHERAPY INTENT: Control/Palliative  CURRENT # OF CHEMOTHERAPY CYCLES: 23 CURRENT ANTIEMETICS: Zofran, dexamethasone, Compazine  CURRENT SMOKING STATUS: Former smoker, quit 12/05/1997  ORAL CHEMOTHERAPY AND CONSENT: n/a  CURRENT BISPHOSPHONATES USE: None  PAIN MANAGEMENT: no cancer related pain, patient does have RA  NARCOTICS INDUCED CONSTIPATION: none  LIVING WILL AND CODE STATUS: ?  INTERVAL HISTORY: Megan [name] 74 y.o. female returns to the clinic today for follow-up visit. The patient is currently on immunotherapy with Nivolumab status post 22 cycles. She is tolerating her treatment well with no significant adverse effects. She continues to have fatigue and aching pain as well as arthralgia secondary to her rheumatoid arthritis. She is followed by Dr. Estanislado Pandy. She denied having any significant weight loss or night sweats. The patient denied having any chest pain, shortness of breath, cough or hemoptysis. She has no  significant nausea or vomiting, no fever or chills. She is here today to start cycle #23 of her immunotherapy.  MEDICAL HISTORY: Past Medical History  Diagnosis Date  . Hypertension   . Rheumatoid arthritis   . GERD (gastroesophageal reflux disease)     no meds for  . Hx of radiation therapy 12/16/13-01/30/14    lung 66Gy  . Neuropathy   . Shortness of breath dyspnea     with exertion  . History of hiatal hernia   . Diabetes mellitus     insulin  . Cancer dx'd 09/2013    Lung ca    ALLERGIES:  is allergic to remicade; carboplatin; and hydromorphone.  MEDICATIONS:  Current Outpatient Prescriptions  Medication Sig Dispense Refill  . azithromycin (ZITHROMAX) 250 MG tablet Take as directed 6 tablet 0  . B-D ULTRAFINE III SHORT PEN 31G X 8 MM MISC   0  . docusate sodium (COLACE) 100 MG capsule Take 1 capsule (100 mg total) by mouth every 12 (twelve) hours. 60 capsule 0  . dronabinol (MARINOL) 10 MG capsule Take 2 capsules (20 mg total) by mouth 2 (two) times daily before a meal. 120 capsule 3  . Fluticasone-Salmeterol (ADVAIR DISKUS) 100-50 MCG/DOSE AEPB Inhale 1 puff into the lungs 2 (two) times daily. 60 each 5  . Gabapentin, Once-Daily, (GRALISE) 600 MG TABS Take 1,800 mg by mouth daily. 90 tablet 11  . HUMALOG KWIKPEN 100 UNIT/ML KiwkPen   0  . ibuprofen (ADVIL,MOTRIN) 600 MG tablet Take 1 tablet (600 mg total) by mouth every 6 (six) hours as needed. Take with food. 30 tablet 0  . LamoTRIgine (LAMICTAL XR) 25 MG TB24 tablet Take 4 tablets (100 mg total) by mouth daily.  Start with 2 pills at night for 2 weeks. Than increase to 4 pills at night. Stop for rash. 120 tablet 11  . lansoprazole (PREVACID SOLUTAB) 30 MG disintegrating tablet Take 1 tablet (30 mg total) by mouth daily. 90 tablet 3  . LEVEMIR FLEXTOUCH 100 UNIT/ML Pen   0  . lidocaine (LIDODERM) 5 % APPLY 3 PATCHES ONTO THE SKIN ONCE DAILY. LEAVE ON FOR 12 HOURS AND OFF FOR 12 HOURS. MAY CUT PATCH TO FIT 90 patch 3  .  losartan-hydrochlorothiazide (HYZAAR) 100-25 MG per tablet Take 1 tablet by mouth daily.    . meloxicam (MOBIC) 15 MG tablet Take 15 mg by mouth daily as needed.     . nystatin-triamcinolone (MYCOLOG II) cream Apply 1 application topically as directed.  0  . ONETOUCH DELICA LANCETS FINE MISC   0  . ONETOUCH VERIO test strip   5  . pantoprazole (PROTONIX) 40 MG tablet Take 1 tablet (40 mg total) by mouth daily. 90 tablet 0  . pantoprazole (PROTONIX) 40 MG tablet TAKE 1 TABLET BY MOUTH DAILY 30 tablet 0  . potassium chloride SA (K-DUR,KLOR-CON) 20 MEQ tablet Take 40 mEq by mouth daily.    . predniSONE (DELTASONE) 10 MG tablet 40 mg x1 day, then 30 mg x1 day, then 20 mg x1 day, then 10 mg x1 day, and then 5 mg x1 day and stop 11 tablet 0  . prochlorperazine (COMPAZINE) 10 MG tablet Take 10 mg by mouth every 6 (six) hours as needed for nausea or vomiting.    . sulfaSALAzine (AZULFIDINE) 500 MG EC tablet TK 2 TS PO BID  2  . UNABLE TO FIND Med Name: Metanex- Vitamin B    . venlafaxine (EFFEXOR) 37.5 MG tablet Take 1 tablet (37.5 mg total) by mouth daily. 30 tablet 1  . zolpidem (AMBIEN) 10 MG tablet Take 10 mg by mouth at bedtime as needed for sleep.     No current facility-administered medications for this visit.   Facility-Administered Medications Ordered in Other Visits  Medication Dose Route Frequency Provider Last Rate Last Dose  . sodium chloride 0.9 % injection 10 mL  10 mL Intracatheter PRN Curt Bears, MD   10 mL at 12/30/13 1121    SURGICAL HISTORY:  Past Surgical History  Procedure Laterality Date  . Cholecystectomy    . Abdominal hysterectomy    . Dilation and curettage of uterus    . Abdominal surgery    . Tonsillectomy    . Esophagogastroduodenoscopy (egd) with propofol N/A 10/09/2014    Procedure: ESOPHAGOGASTRODUODENOSCOPY (EGD) WITH PROPOFOL;  Surgeon: Inda Castle, MD;  Location: [facility];  Service: Endoscopy;  Laterality: N/A;  . Savory dilation N/A 10/09/2014     Procedure: SAVORY DILATION;  Surgeon: Inda Castle, MD;  Location: [facility];  Service: Endoscopy;  Laterality: N/A;  . Esophagogastroduodenoscopy N/A 12/12/2014    Procedure: ESOPHAGOGASTRODUODENOSCOPY (EGD);  Surgeon: Inda Castle, MD;  Location: [facility];  Service: Endoscopy;  Laterality: N/A;  with dilation  . Balloon dilation N/A 12/12/2014    Procedure: BALLOON DILATION;  Surgeon: Inda Castle, MD;  Location: [facility];  Service: Endoscopy;  Laterality: N/A;    REVIEW OF SYSTEMS:  A comprehensive review of systems was negative except for: Constitutional: positive for fatigue Musculoskeletal: positive for arthralgias and muscle weakness   PHYSICAL EXAMINATION: General appearance: alert, cooperative, fatigued and no distress Head: Normocephalic, without obvious abnormality, atraumatic Neck: no adenopathy, no JVD, supple, symmetrical, trachea midline  and thyroid not enlarged, symmetric, no tenderness/mass/nodules Lymph nodes: Cervical, supraclavicular, and axillary nodes normal. Resp: clear to auscultation bilaterally Back: symmetric, no curvature. ROM normal. No CVA tenderness. Cardio: regular rate and rhythm, S1, S2 normal, no murmur, click, rub or gallop GI: soft, non-tender; bowel sounds normal; no masses,  no organomegaly Extremities: extremities normal, atraumatic, no cyanosis or edema  ECOG PERFORMANCE STATUS: 1 - Symptomatic but completely ambulatory  Blood pressure 114/75, pulse 106, temperature 97.7 F (36.5 C), temperature source Oral, resp. rate 17, height '5\' 6"'$  (1.676 m), weight 132 lb (59.875 kg), SpO2 98 %.  LABORATORY DATA: Lab Results  Component Value Date   WBC 3.2* 05/26/2015   HGB 12.8 05/26/2015   HCT 38.8 05/26/2015   MCV 91.7 05/26/2015   PLT 147 05/26/2015      Chemistry      Component Value Date/Time   NA 136 05/12/2015 1010   NA 138 10/09/2014 1328   K 4.2 05/12/2015 1010   K 3.7 10/09/2014 1328   CL 92* 09/08/2014 1417    CO2 27 05/12/2015 1010   CO2 28 09/08/2014 1417   BUN 20.5 05/12/2015 1010   BUN 15 09/08/2014 1417   CREATININE 0.8 05/12/2015 1010   CREATININE 0.67 09/08/2014 1417   GLU 299* 02/02/2015 1348      Component Value Date/Time   CALCIUM 9.3 05/12/2015 1010   CALCIUM 9.6 09/08/2014 1417   ALKPHOS 133 05/12/2015 1010   ALKPHOS 118* 09/08/2014 1417   AST 13 05/12/2015 1010   AST 17 09/08/2014 1417   ALT 21 05/12/2015 1010   ALT 25 09/08/2014 1417   BILITOT 0.54 05/12/2015 1010   BILITOT 0.4 09/08/2014 1417       RADIOGRAPHIC STUDIES: Ct Chest W Contrast  05/08/2015   CLINICAL DATA:  Squamous cell carcinoma of right lung; diagnosed 12/14. Radiation therapy completed 4/15. On maintenance chemotherapy.  EXAM: CT CHEST WITH CONTRAST  TECHNIQUE: Multidetector CT imaging of the chest was performed during intravenous contrast administration.  CONTRAST:  61m OMNIPAQUE IOHEXOL 300 MG/ML  SOLN  COMPARISON:  03/09/2015 and 12/19/2014.  FINDINGS: Mediastinum/Nodes: No supraclavicular adenopathy. Aortic atherosclerosis. Normal heart size, without pericardial effusion. LAD coronary artery atherosclerosis. No central pulmonary embolism, on this non-dedicated study.  No well-defined mediastinal adenopathy. There is soft tissue density within the mediastinum, including the subcarinal station. Example image 25. This is similar. Soft tissue fullness about the right hilum, including on image 22 of series 2 is felt to be similar (radiation induced). No left hilar adenopathy.  Mild mediastinal shift to the right.  Lungs/Pleura: No pleural fluid. Similar narrowing of the right upper lobe bronchus. Right middle lobe bronchus is obstructed or collapse, similar.  Centrilobular emphysema. Volume loss in the medial rib right lower lobe is similar.  Calcified granuloma in the left lower lobe laterally on image 41.  The left lower lobe 6 mm nodule described on the prior exam is resolved. There is a subpleural 2 mm left lower  lobe nodule on image 44 which is similar.  Favor minimal atelectasis in the dependent left lower lobe on image 30.  Redemonstration of mild volume loss and consolidation involving the medial right upper and right middle lobes. This measures maximally 1.3 cm on image 22 of series 2 versus 1.7 cm at the same level on the prior. The previously described central hypoattenuation is no longer identified.  Upper abdomen: Mild hepatic steatosis. Hepatic cysts. Old granulomatous disease in the spleen. Normal imaged portions of  the stomach, pancreas, adrenal glands, kidneys. Cholecystectomy.  Musculoskeletal: No acute osseous abnormality.  IMPRESSION: 1. Volume loss in the right hemi thorax with decrease in medial right upper and right middle lobe consolidation. Favored to be primarily treatment related. No well-defined residual or recurrent disease. 2. The left lower lobe suspicious pulmonary nodule on the prior exam has resolved, favoring an infectious etiology. As the patient is currently undergoing chemotherapy, response to therapy of a pulmonary metastasis possible but felt less likely. 3. No well-defined thoracic adenopathy. Increased density within the mediastinum is favored to be radiation induced and is not significantly changed. Recommend attention on follow-up. 4. Hepatic steatosis.   Electronically Signed   By: Abigail Miyamoto M.D.   On: 05/08/2015 10:13    ASSESSMENT AND PLAN: This is a very pleasant 74 years old white female with metastatic non-small cell lung cancer, squamous cell carcinoma who is currently undergoing treatment with immunotherapy with Nivolumab status post 22 cycles and tolerating her treatment fairly well with no significant evidence for disease progression. I recommended for the patient to proceed with cycle #23 today as a scheduled. She will come back for follow-up visit in 2 weeks for reevaluation before starting the next cycle of her treatment. The patient was advised to call immediately  if she has any concerning symptoms in the interval. The patient voices understanding of current disease status and treatment options and is in agreement with the current care plan.  All questions were answered. The patient knows to call the clinic with any problems, questions or concerns. We can certainly see the patient much sooner if necessary.  Disclaimer: This note was dictated with voice recognition software. Similar sounding words can inadvertently be transcribed and may not be corrected upon review.

## 2015-06-01 ENCOUNTER — Other Ambulatory Visit: Payer: Self-pay | Admitting: Neurology

## 2015-06-08 ENCOUNTER — Encounter: Payer: Self-pay | Admitting: *Deleted

## 2015-06-08 ENCOUNTER — Other Ambulatory Visit: Payer: Self-pay

## 2015-06-08 ENCOUNTER — Other Ambulatory Visit: Payer: Self-pay | Admitting: Neurology

## 2015-06-08 MED ORDER — DRONABINOL 10 MG PO CAPS: 20.0000 mg | ORAL_CAPSULE | Freq: Two times a day (BID) | ORAL | Status: DC

## 2015-06-08 NOTE — Progress Notes (Signed)
Faxed Rx Mariol '10mg'$  cap to pt pharmacy at [phone]. Received fax confirmation.

## 2015-06-09 ENCOUNTER — Other Ambulatory Visit (HOSPITAL_BASED_OUTPATIENT_CLINIC_OR_DEPARTMENT_OTHER): Payer: Medicare Other

## 2015-06-09 ENCOUNTER — Ambulatory Visit (HOSPITAL_BASED_OUTPATIENT_CLINIC_OR_DEPARTMENT_OTHER): Payer: Medicare Other

## 2015-06-09 ENCOUNTER — Telehealth: Payer: Self-pay | Admitting: *Deleted

## 2015-06-09 ENCOUNTER — Encounter: Payer: Self-pay | Admitting: Internal Medicine

## 2015-06-09 ENCOUNTER — Ambulatory Visit (HOSPITAL_BASED_OUTPATIENT_CLINIC_OR_DEPARTMENT_OTHER): Payer: Medicare Other | Admitting: Internal Medicine

## 2015-06-09 ENCOUNTER — Telehealth: Payer: Self-pay | Admitting: Internal Medicine

## 2015-06-09 VITALS — BP 107/60 | HR 111 | Temp 98.3°F | Resp 16 | Ht 66.0 in | Wt 130.3 lb

## 2015-06-09 DIAGNOSIS — C3491 Malignant neoplasm of unspecified part of right bronchus or lung: Secondary | ICD-10-CM

## 2015-06-09 DIAGNOSIS — G620 Drug-induced polyneuropathy: Secondary | ICD-10-CM

## 2015-06-09 DIAGNOSIS — C342 Malignant neoplasm of middle lobe, bronchus or lung: Secondary | ICD-10-CM

## 2015-06-09 DIAGNOSIS — Z5112 Encounter for antineoplastic immunotherapy: Secondary | ICD-10-CM

## 2015-06-09 DIAGNOSIS — Z79899 Other long term (current) drug therapy: Secondary | ICD-10-CM

## 2015-06-09 DIAGNOSIS — T451X5A Adverse effect of antineoplastic and immunosuppressive drugs, initial encounter: Secondary | ICD-10-CM

## 2015-06-09 LAB — COMPREHENSIVE METABOLIC PANEL (CC13)
ALBUMIN: 3.6 g/dL (ref 3.5–5.0)
ALK PHOS: 137 U/L (ref 40–150)
ALT: 19 U/L (ref 0–55)
ANION GAP: 7 meq/L (ref 3–11)
AST: 19 U/L (ref 5–34)
BILIRUBIN TOTAL: 1.02 mg/dL (ref 0.20–1.20)
BUN: 10.4 mg/dL (ref 7.0–26.0)
CALCIUM: 9 mg/dL (ref 8.4–10.4)
CHLORIDE: 103 meq/L (ref 98–109)
CO2: 25 mEq/L (ref 22–29)
Creatinine: 0.8 mg/dL (ref 0.6–1.1)
EGFR: 74 mL/min/{1.73_m2} — ABNORMAL LOW (ref >90)
Glucose: 366 mg/dl — ABNORMAL HIGH (ref 70–140)
Potassium: 4.2 mEq/L (ref 3.5–5.1)
SODIUM: 136 meq/L (ref 136–145)
Total Protein: 6 g/dL — ABNORMAL LOW (ref 6.4–8.3)

## 2015-06-09 LAB — CBC WITH DIFFERENTIAL/PLATELET
BASO%: 0.6 % (ref 0.0–2.0)
Basophils Absolute: 0 10*3/uL (ref 0.0–0.1)
EOS ABS: 0.3 10*3/uL (ref 0.0–0.5)
EOS%: 5.8 % (ref 0.0–7.0)
HCT: 38.2 % (ref 34.8–46.6)
HGB: 12.5 g/dL (ref 11.6–15.9)
LYMPH%: 8.5 % — ABNORMAL LOW (ref 14.0–49.7)
MCH: 30.5 pg (ref 25.1–34.0)
MCHC: 32.8 g/dL (ref 31.5–36.0)
MCV: 93.1 fL (ref 79.5–101.0)
MONO#: 0.4 10*3/uL (ref 0.1–0.9)
MONO%: 7.6 % (ref 0.0–14.0)
NEUT%: 77.5 % — ABNORMAL HIGH (ref 38.4–76.8)
NEUTROS ABS: 4.3 10*3/uL (ref 1.5–6.5)
PLATELETS: 169 10*3/uL (ref 145–400)
RBC: 4.1 10*6/uL (ref 3.70–5.45)
RDW: 15.9 % — ABNORMAL HIGH (ref 11.2–14.5)
WBC: 5.6 10*3/uL (ref 3.9–10.3)
lymph#: 0.5 10*3/uL — ABNORMAL LOW (ref 0.9–3.3)

## 2015-06-09 LAB — TSH CHCC: TSH: 3.845 m(IU)/L (ref 0.308–3.960)

## 2015-06-09 MED ORDER — SODIUM CHLORIDE 0.9 % IV SOLN
Freq: Once | INTRAVENOUS | Status: AC
  Administered 2015-06-09: 12:00:00 via INTRAVENOUS

## 2015-06-09 MED ORDER — SODIUM CHLORIDE 0.9 % IV SOLN
3.0000 mg/kg | Freq: Once | INTRAVENOUS | Status: AC
  Administered 2015-06-09: 180 mg via INTRAVENOUS
  Filled 2015-06-09: qty 18

## 2015-06-09 NOTE — Patient Instructions (Signed)
[facility] Discharge Instructions for Patients Receiving Chemotherapy  Today you received the following chemotherapy agents Nivolumab.  To help prevent nausea and vomiting after your treatment, we encourage you to take your nausea medication as prescribed.   If you develop nausea and vomiting that is not controlled by your nausea medication, call the clinic.   BELOW ARE SYMPTOMS THAT SHOULD BE REPORTED IMMEDIATELY:  *FEVER GREATER THAN 100.5 F  *CHILLS WITH OR WITHOUT FEVER  NAUSEA AND VOMITING THAT IS NOT CONTROLLED WITH YOUR NAUSEA MEDICATION  *UNUSUAL SHORTNESS OF BREATH  *UNUSUAL BRUISING OR BLEEDING  TENDERNESS IN MOUTH AND THROAT WITH OR WITHOUT PRESENCE OF ULCERS  *URINARY PROBLEMS  *BOWEL PROBLEMS  UNUSUAL RASH Items with * indicate a potential emergency and should be followed up as soon as possible.  Feel free to call the clinic you have any questions or concerns. The clinic phone number is [phone].  Please show the CHEMO ALERT CARD at check-in to the Emergency Department and triage nurse.   

## 2015-06-09 NOTE — Telephone Encounter (Signed)
Pt confirmed labs/ov per 08/09 POF, gave pt AVS and Calendar..... KJ, sent msg to add chemo

## 2015-06-09 NOTE — Progress Notes (Signed)
[facility] [phone]   [phone]) [phone]  OFFICE PROGRESS NOTE  [name], MD [address] [place] [address] [place]  [address]  DIAGNOSIS: Squamous cell lung cancer  Primary site: Lung (Right)  Staging method: AJCC 7th Edition  Clinical: Stage IIIB (T3, N3, M0)  Summary: Stage IIIB (T3, N3, M0)   PRIOR THERAPY:  1) Concurrent chemoradiation with weekly chemotherapy in the form of carboplatin for AUC of 2 and paclitaxel 45 mg/m2. Status post 7 week of treatment. Last dose was given 01/27/2014 no significant response in her disease.  2) Consolidation chemotherapy with carboplatin for AUC of 5 and paclitaxel 175 mg/M2 every 3 weeks with Neulasta support. First dose on 04/15/2014. Status post 3 cycles. Carboplatin was discontinued starting cycle #2 secondary to hypersensitivity reaction.  CURRENT THERAPY: Immunotherapy with Opdivo (Nivolumab) '3mg'$ /kg given every 2 weeks. Status post 23 cycles.  CHEMOTHERAPY INTENT: Control/Palliative  CURRENT # OF CHEMOTHERAPY CYCLES: 24 CURRENT ANTIEMETICS: Zofran, dexamethasone, Compazine  CURRENT SMOKING STATUS: Former smoker, quit 12/05/1997  ORAL CHEMOTHERAPY AND CONSENT: n/a  CURRENT BISPHOSPHONATES USE: None  PAIN MANAGEMENT: no cancer related pain, patient does have RA  NARCOTICS INDUCED CONSTIPATION: none  LIVING WILL AND CODE STATUS: ?  INTERVAL HISTORY: Megan [name] 74 y.o. female returns to the clinic today for follow-up visit. The patient is currently on immunotherapy with Nivolumab status post 23 cycles. She is tolerating her treatment well with no significant adverse effects. She denied having any significant weight loss or night sweats. The patient denied having any chest pain, shortness of breath, cough or hemoptysis. She has no significant nausea or vomiting, no fever or chills. She is here today to start cycle #24 of her immunotherapy.  MEDICAL HISTORY: Past  Medical History  Diagnosis Date  . Hypertension   . Rheumatoid arthritis   . GERD (gastroesophageal reflux disease)     no meds for  . Hx of radiation therapy 12/16/13-01/30/14    lung 66Gy  . Neuropathy   . Shortness of breath dyspnea     with exertion  . History of hiatal hernia   . Diabetes mellitus     insulin  . Cancer dx'd 09/2013    Lung ca    ALLERGIES:  is allergic to remicade; carboplatin; shellfish-derived products; and hydromorphone.  MEDICATIONS:  Current Outpatient Prescriptions  Medication Sig Dispense Refill  . azithromycin (ZITHROMAX) 250 MG tablet Take as directed 6 tablet 0  . B-D ULTRAFINE III SHORT PEN 31G X 8 MM MISC   0  . docusate sodium (COLACE) 100 MG capsule Take 1 capsule (100 mg total) by mouth every 12 (twelve) hours. 60 capsule 0  . dronabinol (MARINOL) 10 MG capsule Take 2 capsules (20 mg total) by mouth 2 (two) times daily before a meal. 120 capsule 3  . fluticasone (CUTIVATE) 0.05 % cream APPLY TO AFFECTED AREA BID PRN  5  . Fluticasone-Salmeterol (ADVAIR DISKUS) 100-50 MCG/DOSE AEPB Inhale 1 puff into the lungs 2 (two) times daily. 60 each 5  . Gabapentin, Once-Daily, (GRALISE) 600 MG TABS Take 1,800 mg by mouth daily. 90 tablet 11  . HUMALOG KWIKPEN 100 UNIT/ML KiwkPen   0  . ibuprofen (ADVIL,MOTRIN) 600 MG tablet Take 1 tablet (600 mg total) by mouth every 6 (six) hours as needed. Take with food. 30 tablet 0  . LamoTRIgine (LAMICTAL XR) 25 MG TB24 tablet Take 4 tablets (100 mg total) by mouth daily. Start with 2 pills at night  for 2 weeks. Than increase to 4 pills at night. Stop for rash. 120 tablet 11  . lansoprazole (PREVACID SOLUTAB) 30 MG disintegrating tablet Take 1 tablet (30 mg total) by mouth daily. 90 tablet 3  . LEVEMIR FLEXTOUCH 100 UNIT/ML Pen   0  . lidocaine (LIDODERM) 5 % APPLY 3 PATCHES EXTERNALLY TO THE SKIN EVERY DAY. LEAVE ON FOR 12 HOURS AND OFF FOR 12 HOURS. MAY CUT PATCH TO FIT 90 patch 3  . losartan-hydrochlorothiazide  (HYZAAR) 100-25 MG per tablet Take 1 tablet by mouth daily.    . meloxicam (MOBIC) 15 MG tablet Take 15 mg by mouth daily as needed.     . nystatin-triamcinolone (MYCOLOG II) cream Apply 1 application topically as directed.  0  . ONETOUCH DELICA LANCETS FINE MISC   0  . ONETOUCH VERIO test strip   5  . pantoprazole (PROTONIX) 40 MG tablet Take 1 tablet (40 mg total) by mouth daily. 90 tablet 0  . pantoprazole (PROTONIX) 40 MG tablet TAKE 1 TABLET BY MOUTH DAILY 30 tablet 0  . potassium chloride SA (K-DUR,KLOR-CON) 20 MEQ tablet Take 40 mEq by mouth daily.    . predniSONE (DELTASONE) 10 MG tablet 40 mg x1 day, then 30 mg x1 day, then 20 mg x1 day, then 10 mg x1 day, and then 5 mg x1 day and stop 11 tablet 0  . prochlorperazine (COMPAZINE) 10 MG tablet Take 10 mg by mouth every 6 (six) hours as needed for nausea or vomiting.    . sulfaSALAzine (AZULFIDINE) 500 MG EC tablet TK 2 TS PO BID  2  . UNABLE TO FIND Med Name: Metanex- Vitamin B    . venlafaxine (EFFEXOR) 37.5 MG tablet Take 1 tablet (37.5 mg total) by mouth daily. 30 tablet 1  . zolpidem (AMBIEN) 10 MG tablet Take 10 mg by mouth at bedtime as needed for sleep.     No current facility-administered medications for this visit.   Facility-Administered Medications Ordered in Other Visits  Medication Dose Route Frequency Provider Last Rate Last Dose  . sodium chloride 0.9 % injection 10 mL  10 mL Intracatheter PRN Curt Bears, MD   10 mL at 12/30/13 1121    SURGICAL HISTORY:  Past Surgical History  Procedure Laterality Date  . Cholecystectomy    . Abdominal hysterectomy    . Dilation and curettage of uterus    . Abdominal surgery    . Tonsillectomy    . Esophagogastroduodenoscopy (egd) with propofol N/A 10/09/2014    Procedure: ESOPHAGOGASTRODUODENOSCOPY (EGD) WITH PROPOFOL;  Surgeon: Inda Castle, MD;  Location: [facility];  Service: Endoscopy;  Laterality: N/A;  . Savory dilation N/A 10/09/2014    Procedure: SAVORY  DILATION;  Surgeon: Inda Castle, MD;  Location: [facility];  Service: Endoscopy;  Laterality: N/A;  . Esophagogastroduodenoscopy N/A 12/12/2014    Procedure: ESOPHAGOGASTRODUODENOSCOPY (EGD);  Surgeon: Inda Castle, MD;  Location: [facility];  Service: Endoscopy;  Laterality: N/A;  with dilation  . Balloon dilation N/A 12/12/2014    Procedure: BALLOON DILATION;  Surgeon: Inda Castle, MD;  Location: [facility];  Service: Endoscopy;  Laterality: N/A;    REVIEW OF SYSTEMS:  A comprehensive review of systems was negative except for: Constitutional: positive for fatigue Musculoskeletal: positive for arthralgias and muscle weakness   PHYSICAL EXAMINATION: General appearance: alert, cooperative, fatigued and no distress Head: Normocephalic, without obvious abnormality, atraumatic Neck: no adenopathy, no JVD, supple, symmetrical, trachea midline and thyroid not enlarged, symmetric,  no tenderness/mass/nodules Lymph nodes: Cervical, supraclavicular, and axillary nodes normal. Resp: clear to auscultation bilaterally Back: symmetric, no curvature. ROM normal. No CVA tenderness. Cardio: regular rate and rhythm, S1, S2 normal, no murmur, click, rub or gallop GI: soft, non-tender; bowel sounds normal; no masses,  no organomegaly Extremities: extremities normal, atraumatic, no cyanosis or edema  ECOG PERFORMANCE STATUS: 1 - Symptomatic but completely ambulatory  Blood pressure 107/60, pulse 111, temperature 98.3 F (36.8 C), temperature source Oral, resp. rate 16, height '5\' 6"'$  (1.676 m), weight 130 lb 4.8 oz (59.104 kg), SpO2 97 %.  LABORATORY DATA: Lab Results  Component Value Date   WBC 5.6 06/09/2015   HGB 12.5 06/09/2015   HCT 38.2 06/09/2015   MCV 93.1 06/09/2015   PLT 169 06/09/2015      Chemistry      Component Value Date/Time   NA 136 06/09/2015 1046   NA 138 10/09/2014 1328   K 4.2 06/09/2015 1046   K 3.7 10/09/2014 1328   CL 92* 09/08/2014 1417   CO2 25 06/09/2015  1046   CO2 28 09/08/2014 1417   BUN 10.4 06/09/2015 1046   BUN 15 09/08/2014 1417   CREATININE 0.8 06/09/2015 1046   CREATININE 0.67 09/08/2014 1417   GLU 299* 02/02/2015 1348      Component Value Date/Time   CALCIUM 9.0 06/09/2015 1046   CALCIUM 9.6 09/08/2014 1417   ALKPHOS 137 06/09/2015 1046   ALKPHOS 118* 09/08/2014 1417   AST 19 06/09/2015 1046   AST 17 09/08/2014 1417   ALT 19 06/09/2015 1046   ALT 25 09/08/2014 1417   BILITOT 1.02 06/09/2015 1046   BILITOT 0.4 09/08/2014 1417       RADIOGRAPHIC STUDIES: No results found.  ASSESSMENT AND PLAN: This is a very pleasant 74 years old white female with metastatic non-small cell lung cancer, squamous cell carcinoma who is currently undergoing treatment with immunotherapy with Nivolumab status post 23 cycles and tolerating her treatment fairly well with no significant evidence for disease progression. I recommended for the patient to proceed with cycle #24 today as a scheduled. She will come back for follow-up visit in 2 weeks for reevaluation before starting the next cycle of her treatment. The patient was advised to call immediately if she has any concerning symptoms in the interval. The patient voices understanding of current disease status and treatment options and is in agreement with the current care plan.  All questions were answered. The patient knows to call the clinic with any problems, questions or concerns. We can certainly see the patient much sooner if necessary.  Disclaimer: This note was dictated with voice recognition software. Similar sounding words can inadvertently be transcribed and may not be corrected upon review.

## 2015-06-09 NOTE — Telephone Encounter (Signed)
Per staff message and POF I have scheduled appts. Advised scheduler of appts. JMW  

## 2015-06-16 ENCOUNTER — Telehealth: Payer: Self-pay | Admitting: Neurology

## 2015-06-16 NOTE — Telephone Encounter (Signed)
Spoke w/ pt about burning pain on bottom of her feet. She states usually she does not have much feeling in the bottom of her feet but the burning pain started around 345 pm today. She does have neuropathy. This is nothing new. She uses lidocaine patches from 8pm-8am. Takes gralise and gabapentin. Also was prescribed sulfasalazine by Dr. Patrecia Pour and wanted to make the doctor aware. I offered her f/u appt with Dr. Jaynee Eagles for Thursday 06/18/15 at 1:15pm for check-in at 1:00pm. She took appointment but cannot wait this long. She would like a call from work-in doctor before then. She is aware Dr. Jaynee Eagles is out of the office until Thursday.

## 2015-06-16 NOTE — Telephone Encounter (Signed)
Spoke to mrs. G - Very pleasant patient reporting that she had a sudden onset of plantar pedis pain bilaterally she has a history of diabetes and she used to take Humira for rheumatological disorder there are therefore several risk factors she's also on oral vitamin B-12 supplement. I suggested that she tries and non-steroidal today massages her feet with the Epsom salt foot bath and make sure that she drinks a lot of fluid. She will call Dr. Oneida Alar to see if this is perhaps a rheumatological manifestation. If she doesn't get better by tomorrow she will see her primary care physician or the urgent care. C. Babara Buffalo M.D.

## 2015-06-16 NOTE — Telephone Encounter (Signed)
Patient called regarding sudden burning in bottom of her feet (has neuropathy). It has been pretty severe. Please call (she will be unavailable to talk between 6pm-7pm)

## 2015-06-17 NOTE — Telephone Encounter (Signed)
Thank you :)

## 2015-06-18 ENCOUNTER — Ambulatory Visit (INDEPENDENT_AMBULATORY_CARE_PROVIDER_SITE_OTHER): Payer: Medicare Other | Admitting: Neurology

## 2015-06-18 ENCOUNTER — Encounter: Payer: Self-pay | Admitting: Neurology

## 2015-06-18 VITALS — BP 122/66 | HR 114 | Ht 66.0 in | Wt 132.8 lb

## 2015-06-18 DIAGNOSIS — G620 Drug-induced polyneuropathy: Secondary | ICD-10-CM | POA: Diagnosis not present

## 2015-06-18 DIAGNOSIS — T451X5A Adverse effect of antineoplastic and immunosuppressive drugs, initial encounter: Principal | ICD-10-CM

## 2015-06-18 MED ORDER — DULOXETINE HCL 60 MG PO CPEP: 60.0000 mg | ORAL_CAPSULE | Freq: Every day | ORAL | Status: DC

## 2015-06-18 NOTE — Patient Instructions (Addendum)
Overall you are doing fairly well but I do want to suggest a few things today:  Week one: take 3 tabs Lamictal (lamotrigine) Week two: Take 2 tabs Week one: Take one tab then stop  Remember to drink plenty of fluid, eat healthy meals and do not skip any meals. Try to eat protein with a every meal and eat a healthy snack such as fruit or nuts in between meals. Try to keep a regular sleep-wake schedule and try to exercise daily, particularly in the form of walking, 20-30 minutes a day, if you can.   As far as your medications are concerned, I would like to suggest: Cymbalta 60 mg a day  I would like to see you back in 3 months, sooner if we need to. Please call us with any interim questions, concerns, problems, updates or refill requests.

## 2015-06-18 NOTE — Progress Notes (Signed)
[facility]    Provider:  Dr Jaynee Eagles Referring Provider: Anda Kraft, MD Primary Care Physician:  [name], MD  CC:  Peripheral neuropathy with acute burning pain in the feet  HPI:  Megan [name] is a 74 y.o. female here as a referral from Dr. Wilson Singer for peripheral neuropathy.  4pm Tuesday feet started burning significantly, like on fire on the bootm. This was new. The left hand also started to burn. She has never had it before. The burning is better. It was very difficult to walk. She took ibuprofen and it helped. The burning went away in 20 minutes. The left more than the right. She had shoes on, no new shoes. She was reclining on her bed watching TV. Excruciating on the left. Does not feel the Lamictal is helping with the neuropathy or with mood.  Interval History 05/07/2015: This is a wonderful 74 years old white female with history of stage IIIB non-small cell lung cancer status postcarboplatin and paclitaxel with resultant painful neuropathy.The feet are about the same. Still on the Gralise. Lidocaine patches help somewhat. She is tolerating the Lamictal, no rashes. Discussed several options for her neuropathy, decided to slowly increase Lamictal to '50mg'$  then '75mg'$  and then '100mg'$ . This will hopefully help with her neuropathy and is a great medication for mood. Discussed at length. Ambien ishelping her sleep. Tried Lyrica before the Gralise and that did not work. The Gralise has worked the best so far.   Interval History 11/13/2014: She is feeling better. She is taking the marinol and can sleep at night. The pain is manageable during the day with the Gralise. She has a new diagnosis of diabetes. Her thyroid function is elevated. She takes daily B12. She is having some swelling in her legs which is manageable however it makes it difficult to drive. She still has tingling in the fingers.   Her doctors include  [facility].  Dr Hipolito Bayley Dr. Wilson Singer  Interval History 09/03/2014: She is still struggling with her peripheral neuropathy, nothing is helping. It is severe. Maybe the Gralise helped but then she ran out and didn't call for a prescription. She was recently given prednisone, cymbalta. Cymbalta made her symptoms worse. Started the Gralise and the 1800 mg worked. Belsomra didn't help. Ambien helps her sleep. Lyrica didn't work either for her peripheral neuropathy. Takes b12 daily and biotin. She is sleeping better with Ambien but the neuropathy is still severe. She can't fall asleep, possibly stress. Paresthesias are in the feet and hands. They occurred in the setting of platin-based chemotherapy. They are continuous, worse at night when sleeping. She is also very emotional, crying a lot. Her appetite is very poor.  Visit 08/13/14: This is a very pleasant 74 years old white female with history of stage IIIB non-small cell lung cancer status postcarboplatin and paclitaxel. Patient reports she had chemotherapy and radiation from [place] - April 2nd. 6 weeks later started a second set of chemo, 3 sessions every 3 weeks. The neuropathy started about 3 weeks ago. Feels like burning, pins and needles and lightning strikes. Feet and left hand > right hand. It is in the entire feet to the ankles and entire left hand and digits 3-5 right hand. Severe, cramping in the feet and calfs. She is crying in the office today. Oxycodone not helping with the pain, doesn't even touch it. Didn't try Lyrica. Cymbalta worsened the symptoms. Nothing is helping. She is using a cane. No falls. Balance  if off. The pain is severe, 10/10 and keeps her from sleeping at night. Symptoms are continuous and never subside, but are worse at night.  Reviewed notes, labs and imaging from outside physicians, which showed: 1) Concurrent chemoradiation with weekly chemotherapy in the form of carboplatin for AUC of 2 and paclitaxel 45 mg/m2. Status post 7 week of  treatment. Last dose was given 01/27/2014 no significant response in her disease.  2) Consolidation chemotherapy with carboplatin for AUC of 5 and paclitaxel 175 mg/M2 every 3 weeks with Neulasta support. First dose on 04/15/2014. Status post 3 cycles. Carboplatin was discontinued starting cycle #2 secondary to hypersensitivity reaction.  Her restaging scan showed evidence for disease progression and the patient is currently undergoing immunotherapy with Nivolumab 3 mg/KG every 2 weeks is status post 3 cycle  Review of Systems: Patient complains of symptoms per HPI as well as the following symptoms: Cough, wheezing, shortness of breath, numbness, depression. Pertinent negatives per HPI. All others negative.   Social History   Social History  . Marital Status: Divorced    Spouse Name: N/A  . Number of Children: 0  . Years of Education: MA   Occupational History  . Retired Other   Social History Main Topics  . Smoking status: Former Smoker -- 3.00 packs/day for 45 years    Types: Cigarettes    Quit date: 12/05/1997  . Smokeless tobacco: Never Used  . Alcohol Use: No  . Drug Use: No  . Sexual Activity: Not Currently   Other Topics Concern  . Not on file   Social History Narrative   Patient lives at home alone.   Caffeine Use: 2 cups daily    Family History  Problem Relation Age of Onset  . Hypertension Other   . Stroke Other   . Heart attack Other   . Hemachromatosis Other   . Rheum arthritis      both sets of grandparents and father  . Other Mother   . Other Father     failure to thrive    Past Medical History  Diagnosis Date  . Hypertension   . Rheumatoid arthritis   . GERD (gastroesophageal reflux disease)     no meds for  . Hx of radiation therapy 12/16/13-01/30/14    lung 66Gy  . Neuropathy   . Shortness of breath dyspnea     with exertion  . History of hiatal hernia   . Diabetes mellitus     insulin  . Cancer dx'd 09/2013    Lung ca    Past  Surgical History  Procedure Laterality Date  . Cholecystectomy    . Abdominal hysterectomy    . Dilation and curettage of uterus    . Abdominal surgery    . Tonsillectomy    . Esophagogastroduodenoscopy (egd) with propofol N/A 10/09/2014    Procedure: ESOPHAGOGASTRODUODENOSCOPY (EGD) WITH PROPOFOL;  Surgeon: Inda Castle, MD;  Location: [facility];  Service: Endoscopy;  Laterality: N/A;  . Savory dilation N/A 10/09/2014    Procedure: SAVORY DILATION;  Surgeon: Inda Castle, MD;  Location: [facility];  Service: Endoscopy;  Laterality: N/A;  . Esophagogastroduodenoscopy N/A 12/12/2014    Procedure: ESOPHAGOGASTRODUODENOSCOPY (EGD);  Surgeon: Inda Castle, MD;  Location: [facility];  Service: Endoscopy;  Laterality: N/A;  with dilation  . Balloon dilation N/A 12/12/2014    Procedure: BALLOON DILATION;  Surgeon: Inda Castle, MD;  Location: [facility];  Service: Endoscopy;  Laterality: N/A;    Current  Outpatient Prescriptions  Medication Sig Dispense Refill  . azithromycin (ZITHROMAX) 250 MG tablet Take as directed 6 tablet 0  . B-D ULTRAFINE III SHORT PEN 31G X 8 MM MISC   0  . docusate sodium (COLACE) 100 MG capsule Take 1 capsule (100 mg total) by mouth every 12 (twelve) hours. 60 capsule 0  . dronabinol (MARINOL) 10 MG capsule Take 2 capsules (20 mg total) by mouth 2 (two) times daily before a meal. 120 capsule 3  . fluticasone (CUTIVATE) 0.05 % cream APPLY TO AFFECTED AREA BID PRN  5  . Fluticasone-Salmeterol (ADVAIR DISKUS) 100-50 MCG/DOSE AEPB Inhale 1 puff into the lungs 2 (two) times daily. 60 each 5  . Gabapentin, Once-Daily, (GRALISE) 600 MG TABS Take 1,800 mg by mouth daily. 90 tablet 11  . HUMALOG KWIKPEN 100 UNIT/ML KiwkPen   0  . ibuprofen (ADVIL,MOTRIN) 600 MG tablet Take 1 tablet (600 mg total) by mouth every 6 (six) hours as needed. Take with food. 30 tablet 0  . LamoTRIgine (LAMICTAL XR) 25 MG TB24 tablet Take 4 tablets (100 mg total) by mouth daily. Start  with 2 pills at night for 2 weeks. Than increase to 4 pills at night. Stop for rash. 120 tablet 11  . lansoprazole (PREVACID SOLUTAB) 30 MG disintegrating tablet Take 1 tablet (30 mg total) by mouth daily. 90 tablet 3  . LEVEMIR FLEXTOUCH 100 UNIT/ML Pen   0  . lidocaine (LIDODERM) 5 % APPLY 3 PATCHES EXTERNALLY TO THE SKIN EVERY DAY. LEAVE ON FOR 12 HOURS AND OFF FOR 12 HOURS. MAY CUT PATCH TO FIT 90 patch 3  . losartan-hydrochlorothiazide (HYZAAR) 100-25 MG per tablet Take 1 tablet by mouth daily.    . meloxicam (MOBIC) 15 MG tablet Take 15 mg by mouth daily as needed.     . nystatin-triamcinolone (MYCOLOG II) cream Apply 1 application topically as directed.  0  . ONETOUCH DELICA LANCETS FINE MISC   0  . ONETOUCH VERIO test strip   5  . pantoprazole (PROTONIX) 40 MG tablet Take 1 tablet (40 mg total) by mouth daily. 90 tablet 0  . pantoprazole (PROTONIX) 40 MG tablet TAKE 1 TABLET BY MOUTH DAILY 30 tablet 0  . potassium chloride SA (K-DUR,KLOR-CON) 20 MEQ tablet Take 40 mEq by mouth daily.    . predniSONE (DELTASONE) 10 MG tablet 40 mg x1 day, then 30 mg x1 day, then 20 mg x1 day, then 10 mg x1 day, and then 5 mg x1 day and stop 11 tablet 0  . prochlorperazine (COMPAZINE) 10 MG tablet Take 10 mg by mouth every 6 (six) hours as needed for nausea or vomiting.    . sulfaSALAzine (AZULFIDINE) 500 MG EC tablet TK 2 TS PO BID  2  . zolpidem (AMBIEN) 10 MG tablet Take 10 mg by mouth at bedtime as needed for sleep.    . DULoxetine (CYMBALTA) 60 MG capsule Take 1 capsule (60 mg total) by mouth daily. 30 capsule 11   No current facility-administered medications for this visit.   Facility-Administered Medications Ordered in Other Visits  Medication Dose Route Frequency Provider Last Rate Last Dose  . sodium chloride 0.9 % injection 10 mL  10 mL Intracatheter PRN Curt Bears, MD   10 mL at 12/30/13 1121    Allergies as of 06/18/2015 - Review Complete 06/09/2015  Allergen Reaction Noted  .  Remicade [infliximab] Anaphylaxis 05/13/2012  . Carboplatin  05/29/2014  . Shellfish-derived products Diarrhea 06/09/2015  . Hydromorphone Rash  08/11/2014    Vitals: BP 122/66 mmHg  Pulse 114  Ht '5\' 6"'$  (1.676 m)  Wt 132 lb 12.8 oz (60.238 kg)  BMI 21.44 kg/m2 Last Weight:  Wt Readings from Last 1 Encounters:  06/18/15 132 lb 12.8 oz (60.238 kg)   Last Height:   Ht Readings from Last 1 Encounters:  06/18/15 '5\' 6"'$  (1.676 m)     Sensation: Absent vibration and proprioception in the distal extremities. Impaired pp and temp in the distal extremities. Severe Allodynia and hyperesthesias bottom of the feet. +Romberg.   Reflex Exam: Absent ankle jerks   Assessment/Plan: This is a wonderful 74 year old white female with history of stage IIIB non-small cell lung cancer status postcarboplatin and paclitaxel with resultant severe neuropathy. Exam shows severe loss of sensation in the distal extremities, +Romberg, Ataxic gait and Allodynia and hyperesthesias. Very distressing to patient, . Keeps her up at night with pain. Gralise has helped with the pain during the day.   1. Will continue Gralise '1800mg'$  daily 2. We'll continue the lidocaine topical and patches. 3. Will discontinue Lamictal 4. She stopped taking Effexor, we'll start Cymbalta instead 5. 3 months follow up or sooner if needed  6. offered to try and have Botox approved. There is literature stating its use and peripheral polyneuropathy. The difficult task would be to have insurance approve it. She like to hold off on this right now.   Sarina Ill, MD  [facility] [address] [place], [place] [address]  Phone [phone] Fax [phone]  A total of 15 minutes was spent face-to-face with this patient. Over half this time was spent on counseling patient on the polyneuropathy diagnosis and different diagnostic and therapeutic options available.

## 2015-06-21 ENCOUNTER — Telehealth: Payer: Self-pay | Admitting: Neurology

## 2015-06-21 NOTE — Telephone Encounter (Signed)
Let patient know her EKG was normal sinus rhythm thanks

## 2015-06-22 NOTE — Telephone Encounter (Signed)
Spoke w/ pt to let her know she does not need to take marinol '10mg'$  (typically used to help with n/v) if she is not needing it. Only if it is helping her per Dr Jaynee Eagles. Her EKG was normal sinus rhythm. She verbalized understanding. Told her to call back with any further questions.

## 2015-06-22 NOTE — Telephone Encounter (Signed)
She doesn't have to take it, only if is helping her. Up to her. thanks

## 2015-06-22 NOTE — Telephone Encounter (Signed)
Patient is calling about Rx dronabinol 10 mg. She is unsure if she is still to take this Rx.  Please call.

## 2015-06-23 ENCOUNTER — Other Ambulatory Visit (HOSPITAL_BASED_OUTPATIENT_CLINIC_OR_DEPARTMENT_OTHER): Payer: Medicare Other

## 2015-06-23 ENCOUNTER — Ambulatory Visit (HOSPITAL_BASED_OUTPATIENT_CLINIC_OR_DEPARTMENT_OTHER): Payer: Medicare Other

## 2015-06-23 ENCOUNTER — Ambulatory Visit (HOSPITAL_BASED_OUTPATIENT_CLINIC_OR_DEPARTMENT_OTHER): Payer: Medicare Other | Admitting: Nurse Practitioner

## 2015-06-23 ENCOUNTER — Encounter: Payer: Self-pay | Admitting: Nurse Practitioner

## 2015-06-23 VITALS — BP 101/59 | HR 93 | Temp 97.7°F | Resp 18 | Ht 66.0 in | Wt 133.5 lb

## 2015-06-23 DIAGNOSIS — Z5112 Encounter for antineoplastic immunotherapy: Secondary | ICD-10-CM | POA: Diagnosis not present

## 2015-06-23 DIAGNOSIS — C342 Malignant neoplasm of middle lobe, bronchus or lung: Secondary | ICD-10-CM

## 2015-06-23 DIAGNOSIS — G620 Drug-induced polyneuropathy: Secondary | ICD-10-CM

## 2015-06-23 DIAGNOSIS — T451X5A Adverse effect of antineoplastic and immunosuppressive drugs, initial encounter: Secondary | ICD-10-CM

## 2015-06-23 DIAGNOSIS — C3490 Malignant neoplasm of unspecified part of unspecified bronchus or lung: Secondary | ICD-10-CM

## 2015-06-23 DIAGNOSIS — C3491 Malignant neoplasm of unspecified part of right bronchus or lung: Secondary | ICD-10-CM

## 2015-06-23 DIAGNOSIS — Z79899 Other long term (current) drug therapy: Secondary | ICD-10-CM

## 2015-06-23 LAB — CBC WITH DIFFERENTIAL/PLATELET
BASO%: 0.4 % (ref 0.0–2.0)
BASOS ABS: 0 10*3/uL (ref 0.0–0.1)
EOS%: 21.7 % — ABNORMAL HIGH (ref 0.0–7.0)
Eosinophils Absolute: 1.2 10*3/uL — ABNORMAL HIGH (ref 0.0–0.5)
HCT: 35.2 % (ref 34.8–46.6)
HGB: 11.7 g/dL (ref 11.6–15.9)
LYMPH%: 13.6 % — AB (ref 14.0–49.7)
MCH: 31.6 pg (ref 25.1–34.0)
MCHC: 33.2 g/dL (ref 31.5–36.0)
MCV: 95.1 fL (ref 79.5–101.0)
MONO#: 0.5 10*3/uL (ref 0.1–0.9)
MONO%: 10.1 % (ref 0.0–14.0)
NEUT%: 54.2 % (ref 38.4–76.8)
NEUTROS ABS: 2.9 10*3/uL (ref 1.5–6.5)
Platelets: 143 10*3/uL — ABNORMAL LOW (ref 145–400)
RBC: 3.7 10*6/uL (ref 3.70–5.45)
RDW: 15.7 % — ABNORMAL HIGH (ref 11.2–14.5)
WBC: 5.4 10*3/uL (ref 3.9–10.3)
lymph#: 0.7 10*3/uL — ABNORMAL LOW (ref 0.9–3.3)

## 2015-06-23 LAB — COMPREHENSIVE METABOLIC PANEL (CC13)
ALBUMIN: 3.5 g/dL (ref 3.5–5.0)
ALK PHOS: 113 U/L (ref 40–150)
ALT: 17 U/L (ref 0–55)
AST: 16 U/L (ref 5–34)
Anion Gap: 9 mEq/L (ref 3–11)
BILIRUBIN TOTAL: 0.48 mg/dL (ref 0.20–1.20)
BUN: 15.1 mg/dL (ref 7.0–26.0)
CALCIUM: 8.9 mg/dL (ref 8.4–10.4)
CO2: 26 mEq/L (ref 22–29)
Chloride: 104 mEq/L (ref 98–109)
Creatinine: 0.8 mg/dL (ref 0.6–1.1)
EGFR: 79 mL/min/{1.73_m2} — ABNORMAL LOW (ref >90)
GLUCOSE: 170 mg/dL — AB (ref 70–140)
Potassium: 3.8 mEq/L (ref 3.5–5.1)
SODIUM: 139 meq/L (ref 136–145)
TOTAL PROTEIN: 5.7 g/dL — AB (ref 6.4–8.3)

## 2015-06-23 LAB — TSH CHCC: TSH: 3.142 m(IU)/L (ref 0.308–3.960)

## 2015-06-23 MED ORDER — SODIUM CHLORIDE 0.9 % IV SOLN
3.0000 mg/kg | Freq: Once | INTRAVENOUS | Status: AC
  Administered 2015-06-23: 180 mg via INTRAVENOUS
  Filled 2015-06-23: qty 18

## 2015-06-23 MED ORDER — SODIUM CHLORIDE 0.9 % IV SOLN
Freq: Once | INTRAVENOUS | Status: AC
  Administered 2015-06-23: 15:00:00 via INTRAVENOUS

## 2015-06-23 NOTE — Assessment & Plan Note (Signed)
Patient continues to suffer with chronic neuropathy.  She states that the neuropathy is stable at present.  Will continue to monitor closely.

## 2015-06-23 NOTE — Progress Notes (Signed)
Phone call made to Dr. Annitta Needs, RA pt is not able to take new medication prescribed, [place]. Spoke to Amy who confirms an understanding.

## 2015-06-23 NOTE — Assessment & Plan Note (Signed)
Patient presents to the [facility] today to receive cycle 25 of her Nivolumab immunotherapy.  Patient states she's doing fairly well; with no new complaints.  She continues to suffer with some neuropathy to her bilateral feet as baseline.  She denies any recent fevers or chills.  Also, patient states that her rheumatologist would like to try ARAVA immunosuppressants for treatment of her rheumatoid arthritis; but would like approval per Dr. Julien Nordmann prior to initiating.  After careful review of the interaction between the immunosuppressants and the Nivolumab- Dr. Julien Nordmann has advised NOT to initiate the ARAVA since it may very well decrease the efficacy of the immunotherapy the patient is receiving for treatment of her lung cancer.  [facility] nurse will call the rheumatologist's office to inform.  Patient will be scheduled for restaging CT with contrast of the chest prior to returning on 07/07/2015 for labs, follow up visit to review scan results, and her next immunotherapy infusion.

## 2015-06-23 NOTE — Patient Instructions (Signed)
Nivolumab injection What is this medicine? NIVOLUMAB (nye VOL ue mab) is used to treat certain types of melanoma and lung cancer. This medicine may be used for other purposes; ask your health care provider or pharmacist if you have questions. COMMON BRAND NAME(S): Opdivo What should I tell my health care provider before I take this medicine? They need to know if you have any of these conditions: -eye disease, vision problems -history of pancreatitis -immune system problems -inflammatory bowel disease -kidney disease -liver disease -lung disease -lupus -myasthenia gravis -multiple sclerosis -organ transplant -stomach or intestine problems -thyroid disease -tingling of the fingers or toes, or other nerve disorder -an unusual or allergic reaction to nivolumab, other medicines, foods, dyes, or preservatives -pregnant or trying to get pregnant -breast-feeding How should I use this medicine? This medicine is for infusion into a vein. It is given by a health care professional in a hospital or clinic setting. A special MedGuide will be given to you before each treatment. Be sure to read this information carefully each time. Talk to your pediatrician regarding the use of this medicine in children. Special care may be needed. Overdosage: If you think you've taken too much of this medicine contact a poison control center or emergency room at once. Overdosage: If you think you have taken too much of this medicine contact a poison control center or emergency room at once. NOTE: This medicine is only for you. Do not share this medicine with others. What if I miss a dose? It is important not to miss your dose. Call your doctor or health care professional if you are unable to keep an appointment. What may interact with this medicine? Interactions have not been studied. This list may not describe all possible interactions. Give your health care provider a list of all the medicines, herbs,  non-prescription drugs, or dietary supplements you use. Also tell them if you smoke, drink alcohol, or use illegal drugs. Some items may interact with your medicine. What should I watch for while using this medicine? Tell your doctor or healthcare professional if your symptoms do not start to get better or if they get worse. Your condition will be monitored carefully while you are receiving this medicine. You may need blood work done while you are taking this medicine. What side effects may I notice from receiving this medicine? Side effects that you should report to your doctor or health care professional as soon as possible: -allergic reactions like skin rash, itching or hives, swelling of the face, lips, or tongue -black, tarry stools -bloody or watery diarrhea -changes in vision -chills -cough -depressed mood -eye pain -feeling anxious -fever -general ill feeling or flu-like symptoms -hair loss -loss of appetite -low blood counts - this medicine may decrease the number of white blood cells, red blood cells and platelets. You may be at increased risk for infections and bleeding -pain, tingling, numbness in the hands or feet -redness, blistering, peeling or loosening of the skin, including inside the mouth -red pinpoint spots on skin -signs of decreased platelets or bleeding - bruising, pinpoint red spots on the skin, black, tarry stools, blood in the urine -signs of decreased red blood cells - unusually weak or tired, feeling faint or lightheaded, falls -signs of infection - fever or chills, cough, sore throat, pain or trouble passing urine -signs and symptoms of a dangerous change in heartbeat or heart rhythm like chest pain; dizziness; fast or irregular heartbeat; palpitations; feeling faint or lightheaded, falls; breathing problems -signs   and symptoms of high blood sugar such as dizziness; dry mouth; dry skin; fruity breath; nausea; stomach pain; increased hunger or thirst; increased  urination -signs and symptoms of kidney injury like trouble passing urine or change in the amount of urine -signs and symptoms of liver injury like dark yellow or brown urine; general ill feeling or flu-like symptoms; light-colored stools; loss of appetite; nausea; right upper belly pain; unusually weak or tired; yellowing of the eyes or skin -signs and symptoms of increased potassium like muscle weakness; chest pain; or fast, irregular heartbeat -signs and symptoms of low potassium like muscle cramps or muscle pain; chest pain; dizziness; feeling faint or lightheaded, falls; palpitations; breathing problems; or fast, irregular heartbeat -swelling of the ankles, feet, hands -weight gainSide effects that usually do not require medical attention (report to your doctor or health care professional if they continue or are bothersome): -constipation -general ill feeling or flu-like symptoms -hair loss -loss of appetite -nausea, vomiting This list may not describe all possible side effects. Call your doctor for medical advice about side effects. You may report side effects to FDA at 1-800-FDA-1088. Where should I keep my medicine? This drug is given in a hospital or clinic and will not be stored at home. NOTE: This sheet is a summary. It may not cover all possible information. If you have questions about this medicine, talk to your doctor, pharmacist, or health care provider.  2015, Elsevier/Gold Standard. (2014-01-06 13:18:19)  

## 2015-06-23 NOTE — Progress Notes (Signed)
SYMPTOM MANAGEMENT CLINIC   HPI: Megan [name] 74 y.o. female diagnosed with lung cancer.  Currently undergoing Nivolumab  Immunotherapy.  Patient presents to the [facility] today to receive cycle 25 of her Nivolumab immunotherapy.  Patient states she's doing fairly well; with no new complaints.  She continues to suffer with some neuropathy to her bilateral feet as baseline.  She denies any recent fevers or chills.  Also, patient states that her rheumatologist would like to try ARAVA immunosuppressants for treatment of her rheumatoid arthritis; but would like approval per Dr. Julien Nordmann prior to initiating.  After careful review of the interaction between the immunosuppressants and the Nivolumab- Dr. Julien Nordmann has advised NOT to initiate the ARAVA since it may very well decrease the efficacy of the immunotherapy the patient is receiving for treatment of her lung cancer.  [facility] nurse will call the rheumatologist's office to inform.  Patient will be scheduled for restaging CT with contrast of the chest prior to returning on 07/07/2015 for labs, follow up visit to review scan results, and her next immunotherapy infusion.   HPI  ROS  Past Medical History  Diagnosis Date  . Hypertension   . Rheumatoid arthritis   . GERD (gastroesophageal reflux disease)     no meds for  . Hx of radiation therapy 12/16/13-01/30/14    lung 66Gy  . Neuropathy   . Shortness of breath dyspnea     with exertion  . History of hiatal hernia   . Diabetes mellitus     insulin  . Cancer dx'd 09/2013    Lung ca    Past Surgical History  Procedure Laterality Date  . Cholecystectomy    . Abdominal hysterectomy    . Dilation and curettage of uterus    . Abdominal surgery    . Tonsillectomy    . Esophagogastroduodenoscopy (egd) with propofol N/A 10/09/2014    Procedure: ESOPHAGOGASTRODUODENOSCOPY (EGD) WITH PROPOFOL;  Surgeon: Inda Castle, MD;  Location: [facility];  Service: Endoscopy;   Laterality: N/A;  . Savory dilation N/A 10/09/2014    Procedure: SAVORY DILATION;  Surgeon: Inda Castle, MD;  Location: [facility];  Service: Endoscopy;  Laterality: N/A;  . Esophagogastroduodenoscopy N/A 12/12/2014    Procedure: ESOPHAGOGASTRODUODENOSCOPY (EGD);  Surgeon: Inda Castle, MD;  Location: [facility];  Service: Endoscopy;  Laterality: N/A;  with dilation  . Balloon dilation N/A 12/12/2014    Procedure: BALLOON DILATION;  Surgeon: Inda Castle, MD;  Location: [facility];  Service: Endoscopy;  Laterality: N/A;    has Lung mass; Need for prophylactic vaccination and inoculation against influenza; Squamous cell lung cancer; Cancer of middle lobe of lung; Right-sided chest wall pain; Radiation esophagitis; Dyspnea; Fatigue; Neuropathy due to chemotherapeutic drug; Fever; Dehydration; Leg cramps; Hypoalbuminemia; Thrombocytopenia, unspecified; Hypotension; Cancer associated pain; Malignant cachexia; Acute renal insufficiency; Lung collapse; Chemotherapy-induced neuropathy; Palliative care encounter; DNR (do not resuscitate) discussion; Neuropathic pain; Tachycardia; Depression; Radiation-induced esophageal stricture; Hyperglycemia due to type 2 diabetes mellitus; Emphysema of lung; Encounter for antineoplastic immunotherapy; Hot flashes; Peripheral edema; Hypokalemia; Rheumatoid arthritis; Lumbago; and Acute bronchitis on her problem list.    is allergic to remicade; carboplatin; shellfish-derived products; and hydromorphone.    Medication List       This list is accurate as of: 06/23/15  4:06 PM.  Always use your most recent med list.               B-D ULTRAFINE III SHORT PEN 31G X 8 MM  Misc  Generic drug:  Insulin Pen Needle     dronabinol 10 MG capsule  Commonly known as:  MARINOL  Take 2 capsules (20 mg total) by mouth 2 (two) times daily before a meal.     DULoxetine 60 MG capsule  Commonly known as:  CYMBALTA  Take 1 capsule (60 mg total) by mouth daily.      fluticasone 0.05 % cream  Commonly known as:  CUTIVATE  APPLY TO AFFECTED AREA BID PRN     Fluticasone-Salmeterol 100-50 MCG/DOSE Aepb  Commonly known as:  ADVAIR DISKUS  Inhale 1 puff into the lungs 2 (two) times daily.     Gabapentin (Once-Daily) 600 MG Tabs  Commonly known as:  GRALISE  Take 1,800 mg by mouth daily.     HUMALOG KWIKPEN 100 UNIT/ML KiwkPen  Generic drug:  insulin lispro     ibuprofen 600 MG tablet  Commonly known as:  ADVIL,MOTRIN  Take 1 tablet (600 mg total) by mouth every 6 (six) hours as needed. Take with food.     LamoTRIgine 25 MG Tb24 tablet  Commonly known as:  LAMICTAL XR  Take 4 tablets (100 mg total) by mouth daily. Start with 2 pills at night for 2 weeks. Than increase to 4 pills at night. Stop for rash.     lansoprazole 30 MG disintegrating tablet  Commonly known as:  PREVACID SOLUTAB  Take 1 tablet (30 mg total) by mouth daily.     LEVEMIR FLEXTOUCH 100 UNIT/ML Pen  Generic drug:  Insulin Detemir     lidocaine 5 %  Commonly known as:  LIDODERM  APPLY 3 PATCHES EXTERNALLY TO THE SKIN EVERY DAY. LEAVE ON FOR 12 HOURS AND OFF FOR 12 HOURS. MAY CUT PATCH TO FIT     losartan-hydrochlorothiazide 100-25 MG per tablet  Commonly known as:  HYZAAR  Take 1 tablet by mouth daily.     meloxicam 15 MG tablet  Commonly known as:  MOBIC  Take 15 mg by mouth daily as needed.     nystatin-triamcinolone cream  Commonly known as:  MYCOLOG II  Apply 1 application topically as directed.     ONETOUCH DELICA LANCETS FINE Misc     ONETOUCH VERIO test strip  Generic drug:  glucose blood     pantoprazole 40 MG tablet  Commonly known as:  PROTONIX  TAKE 1 TABLET BY MOUTH DAILY     potassium chloride SA 20 MEQ tablet  Commonly known as:  K-DUR,KLOR-CON  Take 40 mEq by mouth daily.     prochlorperazine 10 MG tablet  Commonly known as:  COMPAZINE  Take 10 mg by mouth every 6 (six) hours as needed for nausea or vomiting.     sulfaSALAzine 500 MG EC  tablet  Commonly known as:  AZULFIDINE  TK 2 TS PO BID     zolpidem 10 MG tablet  Commonly known as:  AMBIEN  Take 10 mg by mouth at bedtime as needed for sleep.         PHYSICAL EXAMINATION  Oncology Vitals 06/23/2015 06/18/2015 06/09/2015 05/26/2015 05/12/2015 05/07/2015 05/06/2015  Height 168 _0  cm -  Weight 60.555 kg 60.238 kg 59.104 kg 59.875 kg 60.464 kg 60.51 kg 60.873 kg  Weight (lbs) 133 lbs 8 oz 132 lbs 13 oz 130 lbs 5 oz 132 lbs 133 lbs 5 oz 133 lbs 6 oz 134 lbs 3 oz  BMI (kg/m2) 21.55 kg/m2 21.43 kg/m2  21.03 kg/m2 21.31 kg/m2 21.52 kg/m2 21.53 kg/m2 -  Temp 97.7 - 98.3 97.7 97.8 - -  Pulse 93 114 111 106 107 110 92  Resp 18 - _0 - -  SpO2 95 - 97 98 99 - 91  BSA (m2) 1.68 m2 1.67 m2 1.66 m2 1.67 m2 1.68 m2 1.68 m2 -   BP Readings from Last 3 Encounters:  06/23/15 101/59  06/18/15 122/66  06/09/15 107/60    Physical Exam  Constitutional: She is oriented to person, place, and time.  Patient appears fatigued, frail, thin, and chronically ill.  HENT:  Head: Normocephalic and atraumatic.  Eyes: Conjunctivae and EOM are normal. Pupils are equal, round, and reactive to light. Right eye exhibits no discharge. Left eye exhibits no discharge. No scleral icterus.  Neck: Normal range of motion.  Pulmonary/Chest: Effort normal. No respiratory distress.  Musculoskeletal: Normal range of motion.  Neurological: She is alert and oriented to person, place, and time.  Skin: Skin is warm and dry.  Psychiatric: Affect normal.  Nursing note and vitals reviewed.   LABORATORY DATA:. Appointment on 06/23/2015  Component Date Value Ref Range Status  . WBC 06/23/2015 5.4  3.9 - 10.3 10e3/uL Final  . NEUT# 06/23/2015 2.9  1.5 - 6.5 10e3/uL Final  . HGB 06/23/2015 11.7  11.6 - 15.9 g/dL Final  . HCT 06/23/2015 35.2  34.8 - 46.6 % Final  . Platelets 06/23/2015 143* 145 - 400 10e3/uL Final  . MCV 06/23/2015 95.1  79.5 - 101.0 fL Final  . MCH 06/23/2015  31.6  25.1 - 34.0 pg Final  . MCHC 06/23/2015 33.2  31.5 - 36.0 g/dL Final  . RBC 06/23/2015 3.70  3.70 - 5.45 10e6/uL Final  . RDW 06/23/2015 15.7* 11.2 - 14.5 % Final  . lymph# 06/23/2015 0.7* 0.9 - 3.3 10e3/uL Final  . MONO# 06/23/2015 0.5  0.1 - 0.9 10e3/uL Final  . Eosinophils Absolute 06/23/2015 1.2* 0.0 - 0.5 10e3/uL Final  . Basophils Absolute 06/23/2015 0.0  0.0 - 0.1 10e3/uL Final  . NEUT% 06/23/2015 54.2  38.4 - 76.8 % Final  . LYMPH% 06/23/2015 13.6* 14.0 - 49.7 % Final  . MONO% 06/23/2015 10.1  0.0 - 14.0 % Final  . EOS% 06/23/2015 21.7* 0.0 - 7.0 % Final  . BASO% 06/23/2015 0.4  0.0 - 2.0 % Final  . TSH 06/23/2015 3.142  0.308 - 3.960 m(IU)/L Final  . Sodium 06/23/2015 139  136 - 145 mEq/L Final  . Potassium 06/23/2015 3.8  3.5 - 5.1 mEq/L Final  . Chloride 06/23/2015 104  98 - 109 mEq/L Final  . CO2 06/23/2015 26  22 - 29 mEq/L Final  . Glucose 06/23/2015 170* 70 - 140 mg/dl Final  . BUN 06/23/2015 15.1  7.0 - 26.0 mg/dL Final  . Creatinine 06/23/2015 0.8  0.6 - 1.1 mg/dL Final  . Total Bilirubin 06/23/2015 0.48  0.20 - 1.20 mg/dL Final  . Alkaline Phosphatase 06/23/2015 113  40 - 150 U/L Final  . AST 06/23/2015 16  5 - 34 U/L Final  . ALT 06/23/2015 17  0 - 55 U/L Final  . Total Protein 06/23/2015 5.7* 6.4 - 8.3 g/dL Final  . Albumin 06/23/2015 3.5  3.5 - 5.0 g/dL Final  . Calcium 06/23/2015 8.9  8.4 - 10.4 mg/dL Final  . Anion Gap 06/23/2015 9  3 - 11 mEq/L Final  . EGFR 06/23/2015 79* >90 ml/min/1.73 m2 Final   eGFR is calculated using the CKD-EPI  Creatinine Equation (2009)     RADIOGRAPHIC STUDIES: No results found.  ASSESSMENT/PLAN:    Neuropathy due to chemotherapeutic drug Patient continues to suffer with chronic neuropathy.  She states that the neuropathy is stable at present.  Will continue to monitor closely.  Squamous cell lung cancer Patient presents to the [facility] today to receive cycle 25 of her Nivolumab immunotherapy.  Patient states  she's doing fairly well; with no new complaints.  She continues to suffer with some neuropathy to her bilateral feet as baseline.  She denies any recent fevers or chills.  Also, patient states that her rheumatologist would like to try ARAVA immunosuppressants for treatment of her rheumatoid arthritis; but would like approval per Dr. Julien Nordmann prior to initiating.  After careful review of the interaction between the immunosuppressants and the Nivolumab- Dr. Julien Nordmann has advised NOT to initiate the ARAVA since it may very well decrease the efficacy of the immunotherapy the patient is receiving for treatment of her lung cancer.  [facility] nurse will call the rheumatologist's office to inform.  Patient will be scheduled for restaging CT with contrast of the chest prior to returning on 07/07/2015 for labs, follow up visit to review scan results, and her next immunotherapy infusion.   Patient stated understanding of all instructions; and was in agreement with this plan of care. The patient knows to call the clinic with any problems, questions or concerns.   This was a shared visit with Dr. Julien Nordmann today.    Total time spent with patient 25 minutes;  with greater 75  percent of that time spent in face to face counseling regarding patient's symptoms,  and coordination of care and follow up.  Disclaimer: This note was dictated with voice recognition software. Similar sounding words can inadvertently be transcribed and may not be corrected upon review.   Drue Second, NP 06/23/2015   ADDENDUM: Hematology/Oncology Attending: I had a face to face encounter with the patient. I recommended her care plan. This is a very pleasant 74 years old white female with metastatic non-small cell lung cancer, squamous cell carcinoma who is currently undergoing treatment with immunotherapy with Nivolumab status post 24 cycles. She is tolerating her treatment fairly well with no significant adverse effects except for mild  fatigue. I recommended for the patient to proceed with cycle #25 today as a scheduled. Her rheumatologist is interested in starting her on [place] for rheumatoid arthritis and I explained to the patient that this is immunosuppressive drug will interact with her treatment with Nivolumab and is better to avoid it. The patient agreed to the current plan. She would come back for follow-up visit in 2 weeks for reevaluation after repeating CT scan of the chest for restaging of her disease. She was advised to call immediately if she has any concerning symptoms in the interval.  Disclaimer: This note was dictated with voice recognition software. Similar sounding words can inadvertently be transcribed and may be missed upon review. Eilleen Kempf., MD 06/23/2015

## 2015-06-29 ENCOUNTER — Encounter (HOSPITAL_COMMUNITY): Payer: Self-pay

## 2015-06-29 ENCOUNTER — Ambulatory Visit (HOSPITAL_COMMUNITY)
Admission: RE | Admit: 2015-06-29 | Discharge: 2015-06-29 | Disposition: A | Discharge status: Home / Self Care | Payer: Medicare Other | Source: Ambulatory Visit | Attending: Nurse Practitioner | Admitting: Nurse Practitioner

## 2015-06-29 DIAGNOSIS — J90 Pleural effusion, not elsewhere classified: Secondary | ICD-10-CM | POA: Insufficient documentation

## 2015-06-29 DIAGNOSIS — C3490 Malignant neoplasm of unspecified part of unspecified bronchus or lung: Secondary | ICD-10-CM

## 2015-06-29 DIAGNOSIS — D7389 Other diseases of spleen: Secondary | ICD-10-CM | POA: Diagnosis not present

## 2015-06-29 DIAGNOSIS — C3491 Malignant neoplasm of unspecified part of right bronchus or lung: Secondary | ICD-10-CM | POA: Diagnosis not present

## 2015-06-29 DIAGNOSIS — K7689 Other specified diseases of liver: Secondary | ICD-10-CM | POA: Insufficient documentation

## 2015-06-29 DIAGNOSIS — I700 Atherosclerosis of aorta: Secondary | ICD-10-CM | POA: Diagnosis not present

## 2015-06-29 DIAGNOSIS — I2510 Atherosclerotic heart disease of native coronary artery without angina pectoris: Secondary | ICD-10-CM | POA: Insufficient documentation

## 2015-06-29 MED ORDER — IOHEXOL 300 MG/ML  SOLN
80.0000 mL | Freq: Once | INTRAMUSCULAR | Status: AC | PRN
  Administered 2015-06-29: 80 mL via INTRAVENOUS

## 2015-07-01 ENCOUNTER — Emergency Department (HOSPITAL_COMMUNITY): Payer: Medicare Other

## 2015-07-01 ENCOUNTER — Emergency Department (HOSPITAL_COMMUNITY)
Admission: EM | Admit: 2015-07-01 | Discharge: 2015-07-01 | Disposition: A | Discharge status: Home / Self Care | Payer: Medicare Other | Attending: Emergency Medicine | Admitting: Emergency Medicine

## 2015-07-01 ENCOUNTER — Encounter (HOSPITAL_COMMUNITY): Payer: Self-pay | Admitting: Emergency Medicine

## 2015-07-01 DIAGNOSIS — Z79899 Other long term (current) drug therapy: Secondary | ICD-10-CM | POA: Insufficient documentation

## 2015-07-01 DIAGNOSIS — S82841A Displaced bimalleolar fracture of right lower leg, initial encounter for closed fracture: Secondary | ICD-10-CM | POA: Insufficient documentation

## 2015-07-01 DIAGNOSIS — Y9289 Other specified places as the place of occurrence of the external cause: Secondary | ICD-10-CM | POA: Diagnosis not present

## 2015-07-01 DIAGNOSIS — Z87891 Personal history of nicotine dependence: Secondary | ICD-10-CM | POA: Diagnosis not present

## 2015-07-01 DIAGNOSIS — I10 Essential (primary) hypertension: Secondary | ICD-10-CM | POA: Insufficient documentation

## 2015-07-01 DIAGNOSIS — R42 Dizziness and giddiness: Secondary | ICD-10-CM | POA: Insufficient documentation

## 2015-07-01 DIAGNOSIS — E119 Type 2 diabetes mellitus without complications: Secondary | ICD-10-CM | POA: Insufficient documentation

## 2015-07-01 DIAGNOSIS — Y998 Other external cause status: Secondary | ICD-10-CM | POA: Insufficient documentation

## 2015-07-01 DIAGNOSIS — Z85118 Personal history of other malignant neoplasm of bronchus and lung: Secondary | ICD-10-CM | POA: Insufficient documentation

## 2015-07-01 DIAGNOSIS — G629 Polyneuropathy, unspecified: Secondary | ICD-10-CM | POA: Insufficient documentation

## 2015-07-01 DIAGNOSIS — Y9389 Activity, other specified: Secondary | ICD-10-CM | POA: Diagnosis not present

## 2015-07-01 DIAGNOSIS — W06XXXA Fall from bed, initial encounter: Secondary | ICD-10-CM | POA: Diagnosis not present

## 2015-07-01 DIAGNOSIS — M069 Rheumatoid arthritis, unspecified: Secondary | ICD-10-CM | POA: Insufficient documentation

## 2015-07-01 DIAGNOSIS — Z7951 Long term (current) use of inhaled steroids: Secondary | ICD-10-CM | POA: Diagnosis not present

## 2015-07-01 DIAGNOSIS — S99921A Unspecified injury of right foot, initial encounter: Secondary | ICD-10-CM | POA: Diagnosis not present

## 2015-07-01 DIAGNOSIS — Z923 Personal history of irradiation: Secondary | ICD-10-CM | POA: Diagnosis not present

## 2015-07-01 DIAGNOSIS — K219 Gastro-esophageal reflux disease without esophagitis: Secondary | ICD-10-CM | POA: Diagnosis not present

## 2015-07-01 DIAGNOSIS — S99911A Unspecified injury of right ankle, initial encounter: Secondary | ICD-10-CM | POA: Diagnosis present

## 2015-07-01 LAB — BASIC METABOLIC PANEL
ANION GAP: 7 (ref 5–15)
BUN: 19 mg/dL (ref 6–20)
CHLORIDE: 100 mmol/L — AB (ref 101–111)
CO2: 28 mmol/L (ref 22–32)
Calcium: 8.6 mg/dL — ABNORMAL LOW (ref 8.9–10.3)
Creatinine, Ser: 0.81 mg/dL (ref 0.44–1.00)
GFR calc non Af Amer: >60 mL/min (ref >60)
Glucose, Bld: 265 mg/dL — ABNORMAL HIGH (ref 65–99)
Potassium: 3.5 mmol/L (ref 3.5–5.1)
Sodium: 135 mmol/L (ref 135–145)

## 2015-07-01 LAB — CBG MONITORING, ED
GLUCOSE-CAPILLARY: 256 mg/dL — AB (ref 65–99)
GLUCOSE-CAPILLARY: 256 mg/dL — AB (ref 65–99)
Glucose-Capillary: 166 mg/dL — ABNORMAL HIGH (ref 65–99)

## 2015-07-01 LAB — URINALYSIS, ROUTINE W REFLEX MICROSCOPIC
Glucose, UA: >1000 mg/dL — AB
Hgb urine dipstick: NEGATIVE
KETONES UR: NEGATIVE mg/dL
LEUKOCYTES UA: NEGATIVE
NITRITE: NEGATIVE
PH: 5.5 (ref 5.0–8.0)
Protein, ur: NEGATIVE mg/dL
Specific Gravity, Urine: 1.034 — ABNORMAL HIGH (ref 1.005–1.030)
UROBILINOGEN UA: 0.2 mg/dL (ref 0.0–1.0)

## 2015-07-01 LAB — CBC
HCT: 35.2 % — ABNORMAL LOW (ref 36.0–46.0)
HEMOGLOBIN: 11.5 g/dL — AB (ref 12.0–15.0)
MCH: 31.3 pg (ref 26.0–34.0)
MCHC: 32.7 g/dL (ref 30.0–36.0)
MCV: 95.9 fL (ref 78.0–100.0)
Platelets: 140 10*3/uL — ABNORMAL LOW (ref 150–400)
RBC: 3.67 MIL/uL — AB (ref 3.87–5.11)
RDW: 15 % (ref 11.5–15.5)
WBC: 4.9 10*3/uL (ref 4.0–10.5)

## 2015-07-01 LAB — URINE MICROSCOPIC-ADD ON

## 2015-07-01 MED ORDER — SULFASALAZINE 500 MG PO TBEC
1000.0000 mg | DELAYED_RELEASE_TABLET | Freq: Two times a day (BID) | ORAL | Status: DC
  Administered 2015-07-01: 1000 mg via ORAL
  Filled 2015-07-01 (×2): qty 2

## 2015-07-01 MED ORDER — INSULIN ASPART 100 UNIT/ML ~~LOC~~ SOLN
6.0000 [IU] | Freq: Three times a day (TID) | SUBCUTANEOUS | Status: DC
  Administered 2015-07-01 (×2): 6 [IU] via SUBCUTANEOUS
  Filled 2015-07-01 (×2): qty 1

## 2015-07-01 MED ORDER — POTASSIUM CHLORIDE CRYS ER 20 MEQ PO TBCR
40.0000 meq | EXTENDED_RELEASE_TABLET | Freq: Every day | ORAL | Status: DC
Start: 2015-07-01 — End: 2015-07-01
  Administered 2015-07-01: 40 meq via ORAL
  Filled 2015-07-01: qty 2

## 2015-07-01 MED ORDER — SODIUM CHLORIDE 0.9 % IV BOLUS (SEPSIS)
500.0000 mL | Freq: Once | INTRAVENOUS | Status: AC
  Administered 2015-07-01: 500 mL via INTRAVENOUS

## 2015-07-01 MED ORDER — GABAPENTIN (ONCE-DAILY) 600 MG PO TABS: 600.0000 mg | ORAL_TABLET | Freq: Three times a day (TID) | ORAL | Status: DC

## 2015-07-01 MED ORDER — LOSARTAN POTASSIUM 50 MG PO TABS
100.0000 mg | ORAL_TABLET | Freq: Every day | ORAL | Status: DC
  Administered 2015-07-01: 100 mg via ORAL
  Filled 2015-07-01: qty 2

## 2015-07-01 MED ORDER — HYDROCHLOROTHIAZIDE 25 MG PO TABS
25.0000 mg | ORAL_TABLET | Freq: Every day | ORAL | Status: DC
  Administered 2015-07-01: 25 mg via ORAL
  Filled 2015-07-01: qty 1

## 2015-07-01 MED ORDER — LOSARTAN POTASSIUM-HCTZ 100-25 MG PO TABS: 1.0000 | ORAL_TABLET | Freq: Every day | ORAL | Status: DC

## 2015-07-01 MED ORDER — INSULIN LISPRO 100 UNIT/ML (KWIKPEN): 6.0000 [IU] | PEN_INJECTOR | Freq: Three times a day (TID) | SUBCUTANEOUS | Status: DC

## 2015-07-01 MED ORDER — DULOXETINE HCL 60 MG PO CPEP
60.0000 mg | ORAL_CAPSULE | Freq: Every day | ORAL | Status: DC
  Administered 2015-07-01: 60 mg via ORAL
  Filled 2015-07-01: qty 1

## 2015-07-01 MED ORDER — GABAPENTIN 300 MG PO CAPS
600.0000 mg | ORAL_CAPSULE | Freq: Three times a day (TID) | ORAL | Status: DC
  Administered 2015-07-01: 600 mg via ORAL
  Filled 2015-07-01: qty 2

## 2015-07-01 MED ORDER — HYDROCODONE-ACETAMINOPHEN 5-325 MG PO TABS: 1.0000 | ORAL_TABLET | ORAL | Status: DC | PRN

## 2015-07-01 NOTE — ED Provider Notes (Signed)
CSN: [id number]     Arrival date & time 07/01/15  0036 History   First MD Initiated Contact with Patient 07/01/15 0047     Chief Complaint  Patient presents with  . Fall  . Ankle Injury  . Foot Injury     (Consider location/radiation/quality/duration/timing/severity/associated sxs/prior Treatment) HPI Comments: Patient with a history of insulin dependent DM, lung CA on chemotherapy, rheumatoid arthritis presents with right ankle pain and deformity after fall earlier this evening. She reports her blood sugar was elevated today because she forgot to take her insulin yesterday. She elevated her insulin doses today per sliding scale schedule. No nausea or vomiting. Tonight when she got up to go to the bathroom she became lightheaded and off balance, fell against the wall and to the ground injuring her ankle. No head injury, neck pain, chest or abdominal pain. No SOB, recent fever or cough. No urinary symptoms.   Patient is a 74 y.o. female presenting with fall, lower extremity injury, and foot injury. The history is provided by the patient. No language interpreter was used.  Fall Pertinent negatives include no abdominal pain, chest pain, chills, fever, nausea, neck pain or vomiting.  Ankle Injury Pertinent negatives include no abdominal pain, chest pain, chills, fever, nausea, neck pain or vomiting.  Foot Injury Associated symptoms: no back pain, no fever and no neck pain     Past Medical History  Diagnosis Date  . Hypertension   . Rheumatoid arthritis   . GERD (gastroesophageal reflux disease)     no meds for  . Hx of radiation therapy 12/16/13-01/30/14    lung 66Gy  . Neuropathy   . Shortness of breath dyspnea     with exertion  . History of hiatal hernia   . Diabetes mellitus     insulin  . Cancer dx'd 09/2013    Lung ca   Past Surgical History  Procedure Laterality Date  . Cholecystectomy    . Abdominal hysterectomy    . Dilation and curettage of uterus    . Abdominal  surgery    . Tonsillectomy    . Esophagogastroduodenoscopy (egd) with propofol N/A 10/09/2014    Procedure: ESOPHAGOGASTRODUODENOSCOPY (EGD) WITH PROPOFOL;  Surgeon: Inda Castle, MD;  Location: [facility];  Service: Endoscopy;  Laterality: N/A;  . Savory dilation N/A 10/09/2014    Procedure: SAVORY DILATION;  Surgeon: Inda Castle, MD;  Location: [facility];  Service: Endoscopy;  Laterality: N/A;  . Esophagogastroduodenoscopy N/A 12/12/2014    Procedure: ESOPHAGOGASTRODUODENOSCOPY (EGD);  Surgeon: Inda Castle, MD;  Location: [facility];  Service: Endoscopy;  Laterality: N/A;  with dilation  . Balloon dilation N/A 12/12/2014    Procedure: BALLOON DILATION;  Surgeon: Inda Castle, MD;  Location: [facility];  Service: Endoscopy;  Laterality: N/A;   Family History  Problem Relation Age of Onset  . Hypertension Other   . Stroke Other   . Heart attack Other   . Hemachromatosis Other   . Rheum arthritis      both sets of grandparents and father  . Other Mother   . Other Father     failure to thrive   Social History  Substance Use Topics  . Smoking status: Former Smoker -- 3.00 packs/day for 45 years    Types: Cigarettes    Quit date: 12/05/1997  . Smokeless tobacco: Never Used  . Alcohol Use: No   OB History    No data available     Review of Systems  Constitutional: Negative for fever and chills.  HENT: Negative.   Eyes: Negative for visual disturbance.  Respiratory: Negative.  Negative for shortness of breath.   Cardiovascular: Negative.  Negative for chest pain.  Gastrointestinal: Negative.  Negative for nausea, vomiting and abdominal pain.  Genitourinary: Negative.  Negative for dysuria.  Musculoskeletal: Negative for back pain and neck pain.       See HPI.  Skin: Negative.  Negative for wound.  Neurological: Positive for light-headedness. Negative for syncope.      Allergies  Remicade; Carboplatin; Shellfish-derived products; and  Hydromorphone  Home Medications   Prior to Admission medications   Medication Sig Start Date End Date Taking? Authorizing Provider  DULoxetine (CYMBALTA) 60 MG capsule Take 1 capsule (60 mg total) by mouth daily. 06/18/15  Yes Melvenia Beam, MD  fluticasone (CUTIVATE) 0.05 % cream Apply 1 application topically daily.   Yes Historical Provider, MD  Fluticasone-Salmeterol (ADVAIR DISKUS) 100-50 MCG/DOSE AEPB Inhale 1 puff into the lungs 2 (two) times daily. Patient taking differently: Inhale 1 puff into the lungs 2 (two) times daily as needed (shortness of breath).  08/27/14  Yes Brand Males, MD  Gabapentin, Once-Daily, (GRALISE) 600 MG TABS Take 1,800 mg by mouth daily. Patient taking differently: Take 600 mg by mouth 3 (three) times daily.  05/07/15  Yes Melvenia Beam, MD  HUMALOG KWIKPEN 100 UNIT/ML KiwkPen Inject 6 Units into the skin 3 (three) times daily.  10/28/14  Yes Historical Provider, MD  ibuprofen (ADVIL,MOTRIN) 600 MG tablet Take 1 tablet (600 mg total) by mouth every 6 (six) hours as needed. Take with food. 10/06/14  Yes Curt Bears, MD  LEVEMIR FLEXTOUCH 100 UNIT/ML Pen Inject 28 Units into the skin daily at 10 pm.  10/28/14  Yes Historical Provider, MD  lidocaine (LIDODERM) 5 % APPLY 3 PATCHES EXTERNALLY TO THE SKIN EVERY DAY. LEAVE ON FOR 12 HOURS AND OFF FOR 12 HOURS. MAY CUT PATCH TO FIT 06/09/15  Yes Melvenia Beam, MD  losartan-hydrochlorothiazide (HYZAAR) 100-25 MG per tablet Take 1 tablet by mouth daily.   Yes Historical Provider, MD  nivolumab 3 mg/kg in sodium chloride 0.9 % 100 mL Inject 3 mg/kg into the vein 2 (two) times a week.   Yes Historical Provider, MD  potassium chloride SA (K-DUR,KLOR-CON) 20 MEQ tablet Take 40 mEq by mouth daily.   Yes Historical Provider, MD  sulfaSALAzine (AZULFIDINE) 500 MG EC tablet Take 1,000 mg by mouth 2 (two) times daily.   Yes Historical Provider, MD  B-D ULTRAFINE III SHORT PEN 31G X 8 MM MISC  10/28/14   Historical Provider,  MD  dronabinol (MARINOL) 10 MG capsule Take 2 capsules (20 mg total) by mouth 2 (two) times daily before a meal. Patient not taking: Reported on 07/01/2015 06/08/15   Melvenia Beam, MD  LamoTRIgine (LAMICTAL XR) 25 MG TB24 tablet Take 4 tablets (100 mg total) by mouth daily. Start with 2 pills at night for 2 weeks. Than increase to 4 pills at night. Stop for rash. Patient taking differently: Take 50 mg by mouth at bedtime. For 1 week then '25mg'$  for 1 week then stop 05/07/15   Melvenia Beam, MD  lansoprazole (PREVACID SOLUTAB) 30 MG disintegrating tablet Take 1 tablet (30 mg total) by mouth daily. Patient not taking: Reported on 07/01/2015 10/01/14   Inda Castle, MD  [facility] LANCETS FINE [place]  02/04/15   Historical Provider, MD  [facility] VERIO test strip  02/03/15  Historical Provider, MD  pantoprazole (PROTONIX) 40 MG tablet TAKE 1 TABLET BY MOUTH DAILY Patient not taking: Reported on 06/23/2015 04/28/15   Brand Males, MD  prochlorperazine (COMPAZINE) 10 MG tablet Take 10 mg by mouth every 6 (six) hours as needed for nausea or vomiting.    Historical Provider, MD  zolpidem (AMBIEN) 10 MG tablet Take 10 mg by mouth at bedtime as needed for sleep.    Historical Provider, MD   BP 110/53 mmHg  Pulse 81  Temp(Src) 97.8 F (36.6 C) (Oral)  Resp 11  SpO2 95% Physical Exam  Constitutional: She is oriented to person, place, and time. She appears well-developed and well-nourished.  HENT:  Head: Normocephalic and atraumatic.  Neck: Normal range of motion. Neck supple.  Cardiovascular: Normal rate and regular rhythm.   Pulmonary/Chest: Effort normal and breath sounds normal.  Abdominal: Soft. Bowel sounds are normal. There is no tenderness. There is no rebound and no guarding.  Musculoskeletal: Normal range of motion.  Right ankle significant swollen without significant bony deformity. Moves all toes. Distal pulses intact. NO calf tenderness. No midline or paracervical tenderness.    Neurological: She is alert and oriented to person, place, and time. Coordination normal.  Speech clear and focused. CN's 3-12 grossly intact. No deficits of coordination.   Skin: Skin is warm and dry. No rash noted.  Healing ecchymotic area to right shoulder, appears old.  Psychiatric: She has a normal mood and affect.    ED Course  Procedures (including critical care time) Labs Review Labs Reviewed  BASIC METABOLIC PANEL - Abnormal; Notable for the following:    Chloride 100 (*)    Glucose, Bld 265 (*)    Calcium 8.6 (*)    All other components within normal limits  CBC - Abnormal; Notable for the following:    RBC 3.67 (*)    Hemoglobin 11.5 (*)    HCT 35.2 (*)    Platelets 140 (*)    All other components within normal limits  URINALYSIS, ROUTINE W REFLEX MICROSCOPIC (NOT AT [facility]) - Abnormal; Notable for the following:    Color, Urine AMBER (*)    Specific Gravity, Urine 1.034 (*)    Glucose, UA >1000 (*)    Bilirubin Urine SMALL (*)    All other components within normal limits  CBG MONITORING, ED - Abnormal; Notable for the following:    Glucose-Capillary 256 (*)    All other components within normal limits  URINE CULTURE  URINE MICROSCOPIC-ADD ON    Imaging Review Dg Tibia/fibula Right  07/01/2015   CLINICAL DATA:  [place] getting out of bed this morning  EXAM: RIGHT TIBIA AND FIBULA - 2 VIEW  COMPARISON:  None.  FINDINGS: The distal fibular fracture is again evident. The proximal and midportions of the fibula and tibia are intact.  IMPRESSION: Proximal and midportions of the tibia and fibula are intact.   Electronically Signed   By: Andreas Newport M.D.   On: 07/01/2015 01:54   Dg Ankle Complete Right  07/01/2015   CLINICAL DATA:  [place] this morning.  EXAM: RIGHT ANKLE - COMPLETE 3+ VIEW  COMPARISON:  None.  FINDINGS: There is a long oblique fracture of the distal fibula beginning 8-10 cm above the tibiotalar joint line and extending down to the level of the tailor dome.  There is approximately 1/2 shaft width posterolateral displacement about this fracture. There is a fracture of the posterior lateral tibia with a few mm displacement. There is a  small fracture off the medial malleolar tip. There is mild asymmetric widening of the mortise medially.  IMPRESSION: Fractures of the distal fibula and posterior tibia. Small medial malleolar tip fragment.   Electronically Signed   By: Andreas Newport M.D.   On: 07/01/2015 01:52   Ct Chest W Contrast  06/29/2015   CLINICAL DATA:  74 year old female with history of right-sided lung cancer diagnosed in 2014. Restaging examination.  EXAM: CT CHEST WITH CONTRAST  TECHNIQUE: Multidetector CT imaging of the chest was performed during intravenous contrast administration.  CONTRAST:  7m OMNIPAQUE IOHEXOL 300 MG/ML  SOLN  COMPARISON:  Multiple prior chest CTs, most recently 05/08/2015.  FINDINGS: Mediastinum/Lymph Nodes: Heart size is normal. There is no significant pericardial fluid, thickening or pericardial calcification. There is atherosclerosis of the thoracic aorta, the great vessels of the mediastinum and the coronary arteries, including calcified atherosclerotic plaque in the left anterior descending coronary artery. Prominent subcarinal nodal tissue measuring 13 mm in short axis, stable compared to recent prior examinations. No other definite mediastinal lymphadenopathy. Soft tissue fullness in the right hilar region, similar to prior examinations, likely post treatment related. No left hilar lymphadenopathy. Numerous densely calcified left hilar lymph nodes again noted. Esophagus is unremarkable in appearance. No axillary lymphadenopathy.  Lungs/Pleura: There continues to be complete chronic collapse of the right upper and middle lobes, similar to prior examinations. Notably however, the overall bulk of the collapsed right upper lobe in particular appears more substantial than prior studies measuring up to 21 mm in thickness on  today's examination (compared with 13 mm on the prior). Architectural distortion in the hyperinflated right lower lobe, presumably from some chronic postradiation fibrosis. Trace right pleural effusion lying dependently. Small calcified granuloma in the left lower lobe. Other 2 mm left lower lobe pulmonary nodule is unchanged compared to prior examinations, favored to be benign. No acute consolidative airspace disease.  Upper Abdomen: 15 mm simple cyst in the left lobe of the liver between segments 2/3 is unchanged, as is a 9 mm low attenuation lesion in segment 4A which is too small to characterize, but favored to represent a tiny cyst. Status post cholecystectomy. Calcified granulomas in the spleen.  Musculoskeletal/Soft Tissues: Healing nondisplaced fracture of the anterior aspect of the right second rib in an area of mixed lucency and sclerosis, likely a pathologic fracture at site of radiation osteonecrosis. There are otherwise no aggressive appearing lytic or blastic lesions noted in the visualized portions of the skeleton.  IMPRESSION: 1. Posttreatment related changes of prior radiation therapy with complete chronic collapse of right upper and middle lobes. Although this is similar to prior examinations, the right upper lobe appears slightly more bulky than the prior study, which could suggest the presence of residual/recurrent disease. Given the presence of a new trace right pleural effusion, this could be further evaluated with repeat PET-CT if clinically appropriate. 2. Additional findings, as above, similar to prior examinations.   Electronically Signed   By: DVinnie LangtonM.D.   On: 06/29/2015 08:59   Dg Foot Complete Right  07/01/2015   CLINICAL DATA:  [place] bed this morning.  EXAM: RIGHT FOOT COMPLETE - 3+ VIEW  COMPARISON:  None.  FINDINGS: The fractures of the distal fibula and tibia are again evident. The foot is intact, with no evidence of acute fracture or dislocation. There is  mild hallux valgus.  IMPRESSION: Fractures of the distal tibia and fibula. Negative for acute fracture about the foot.  Electronically Signed   By: Andreas Newport M.D.   On: 07/01/2015 01:53   I have personally reviewed and evaluated these images and lab results as part of my medical decision-making.   EKG Interpretation   Date/Time:  Wednesday July 01 2015 00:43:28 EDT Ventricular Rate:  81 PR Interval:  185 QRS Duration: 80 QT Interval:  391 QTC Calculation: 454 R Axis:   51 Text Interpretation:  Sinus rhythm Consider left atrial enlargement No  significant change was found Confirmed by CAMPOS  MD, KEVIN ([id number]) on  07/01/2015 1:34:56 AM      MDM   Final diagnoses:  None    1. Bimalleolar fracture, right 2. Fall 3. Hyperglycemia  The patient is very well appearing. She will need orthopedic follow up for treatment of fracture. Blood sugar trending down without intervention.   She has no family to help her in the home. Will have social work consult in the morning to help set up in-home nursing care. She has a walker at home.  Patient care transferred to Delos Haring, Pa-C, for social work follow up.    Charlann Lange, PA-C 07/01/15 [place], MD 07/01/15 [phone]

## 2015-07-01 NOTE — ED Notes (Signed)
Bed: QW03 Expected date:  Expected time:  Means of arrival:  Comments: EMS fall obvious deformity right ankle

## 2015-07-01 NOTE — Evaluation (Signed)
Physical Therapy Evaluation Patient Details Name: Megan [name] MRN: [id number] DOB: [birth year] Today's Date: 07/01/2015   History of Present Illness  74 yo female admitted after fall at home, R bimalleolar ankle fx. Hx of DM, lung cancer, RA, HTN, neuropathy. Pt is from home alone  Clinical Impression  On eval, pt required Min assist for mobility-able to take a few hopping steps with RW. Pain rated 6/10 at rest, 7/10 with activity. Recommend ST rehab at SNF. Do not feel pt could safely manage at home alone at this time.     Follow Up Recommendations SNF;Supervision/Assistance - 24 hour    Equipment Recommendations  Wheelchair (measurements PT);Wheelchair cushion (measurements PT)    Recommendations for Other Services       Precautions / Restrictions Precautions Precautions: Fall Restrictions Weight Bearing Restrictions: No      Mobility  Bed Mobility Overal bed mobility: Needs Assistance Bed Mobility: Supine to Sit;Sit to Supine     Supine to sit: Min assist Sit to supine: Min assist   General bed mobility comments: Assist for R LE. VCs safety  Transfers Overall transfer level: Needs assistance Equipment used: Rolling walker (2 wheeled) Transfers: Sit to/from Stand Sit to Stand: Min assist         General transfer comment: Assist to rise, stabilize, control descent. VC safety, technique, hand placement.   Ambulation/Gait Ambulation/Gait assistance: Min assist Ambulation Distance (Feet): 4 Feet Assistive device: Rolling walker (2 wheeled) Gait Pattern/deviations: Step-to pattern     General Gait Details: VCs safety, technique, adherence to NWB status. Pt was able to take 4 hopping steps forward then backwards. Assist to stabilize and maneuver safely with walker  Stairs            Wheelchair Mobility    Modified Rankin (Stroke Patients Only)       Balance Overall balance assessment: Needs assistance;History of Falls         Standing  balance support: Bilateral upper extremity supported;During functional activity Standing balance-Leahy Scale: Poor Standing balance comment: needs RW                             Pertinent Vitals/Pain Pain Assessment: 0-10 Pain Score: 7  Pain Location: R ankle with activity. Rated 6/10 at rest Pain Descriptors / Indicators: Aching;Sore Pain Intervention(s): Limited activity within patient's tolerance;Monitored during session;Repositioned    Home Living Family/patient expects to be discharged to:: Private residence Living Arrangements: Alone Available Help at Discharge:  (housekeeper once/week) Type of Home: House Home Access: Stairs to enter   [employer] of Steps: 1 Home Layout: One level Home Equipment: Environmental consultant - 4 wheels;Cane - single point;Shower seat      Prior Function Level of Independence: Independent               Hand Dominance        Extremity/Trunk Assessment   Upper Extremity Assessment: Overall WFL for tasks assessed           Lower Extremity Assessment: RLE deficits/detail RLE Deficits / Details: hip flex 3/5. Splint lower leg    Cervical / Trunk Assessment: Normal  Communication   Communication: No difficulties  Cognition Arousal/Alertness: Awake/alert Behavior During Therapy: WFL for tasks assessed/performed Overall Cognitive Status: Within Functional Limits for tasks assessed                      General Comments      Exercises  Assessment/Plan    PT Assessment Patient needs continued PT services  PT Diagnosis Difficulty walking;Acute pain   PT Problem List Decreased strength;Decreased range of motion;Decreased activity tolerance;Decreased balance;Decreased mobility;Decreased coordination;Pain;Decreased knowledge of precautions;Decreased knowledge of use of DME  PT Treatment Interventions DME instruction;Gait training;Functional mobility training;Therapeutic activities;Therapeutic  exercise;Patient/family education;Balance training   PT Goals (Current goals can be found in the Care Plan section) Acute Rehab PT Goals Patient Stated Goal: less pain PT Goal Formulation: With patient Time For Goal Achievement: 07/08/15    Frequency Min 3X/week   Barriers to discharge        Co-evaluation               End of Session Equipment Utilized During Treatment: Gait belt Activity Tolerance: Patient limited by pain Patient left: in bed;with call bell/phone within reach      Functional Assessment Tool Used: clinical judgement Functional Limitation: Mobility: Walking and moving around Mobility: Walking and Moving Around Current Status (W4132): At least 20 percent but less than 40 percent impaired, limited or restricted Mobility: Walking and Moving Around Goal Status [phone]): At least 1 percent but less than 20 percent impaired, limited or restricted    Time: 0848-0902 PT Time Calculation (min) (ACUTE ONLY): 14 min   Charges:   PT Evaluation $Initial PT Evaluation Tier I: 1 Procedure     PT G Codes:   PT G-Codes **NOT FOR INPATIENT CLASS** Functional Assessment Tool Used: clinical judgement Functional Limitation: Mobility: Walking and moving around Mobility: Walking and Moving Around Current Status (U7253): At least 20 percent but less than 40 percent impaired, limited or restricted Mobility: Walking and Moving Around Goal Status [phone]): At least 1 percent but less than 20 percent impaired, limited or restricted    Megan [name], MPT Pager: [phone]

## 2015-07-01 NOTE — ED Notes (Signed)
Per EMS- pt was getting out of bed, became dizzy and fell down. No LOC. Remembers events of falls. Has deformity to right ankle. Rating pain 3/10. Initial BP 80/50. Last BP was 94/50. Has received 500 cc NS bolus. No pain medications given d/t BP. Left foot splinted. 12 Lead unremarkable. NSR. CBG 245 mg/dl. Patient lives alone. Hx lung CA-undergoing a form a chemotherapy (once every two weeks).

## 2015-07-01 NOTE — Progress Notes (Addendum)
Pt completed pt eval, csw submitted eval to snf for insurance auth. Pt bed anticipated to be available this afternoon. Pt s/o to pack pt belongings at home and bring back to hospital. Pt s/o to provide transportation for patient from ed to [facility] once bed is available this afternoon.   Belia Heman, [place] Work  [employer] [phone]

## 2015-07-01 NOTE — Clinical Social Work Placement (Signed)
   CLINICAL SOCIAL WORK PLACEMENT  NOTE  Date:  07/01/2015  Patient Details  Name: Megan [name] MRN: [id number] Date of Birth: January 03, 1941  Clinical Social Work is seeking post-discharge placement for this patient at the [place] level of care (*CSW will initial, date and re-position this form in  chart as items are completed):  Yes   Patient/family provided with [facility] Work Department's list of facilities offering this level of care within the geographic area requested by the patient (or if unable, by the patient's family).  Yes   Patient/family informed of their freedom to choose among providers that offer the needed level of care, that participate in Medicare, Medicaid or managed care program needed by the patient, have an available bed and are willing to accept the patient.  Yes   Patient/family informed of [facility]'s ownership interest in [facility] and [facility], as well as of the fact that they are under no obligation to receive care at these facilities.  PASRR submitted to EDS on 07/01/15     PASRR number received on 07/01/15     Existing PASRR number confirmed on       FL2 transmitted to all facilities in geographic area requested by pt/family on 07/01/15     FL2 transmitted to all facilities within larger geographic area on       Patient informed that his/her managed care company has contracts with or will negotiate with certain facilities, including the following:        Yes   Patient/family informed of bed offers received.  Patient chooses bed at [facility]     Physician recommends and patient chooses bed at      Patient to be transferred to [facility] on 07/01/15.  Patient to be transferred to facility by s/o private vehicle     Patient family notified on 07/01/15 of transfer.  Name of family member notified:  s/o at bedside and informed     PHYSICIAN       Additional  Comment:    _______________________________________________ Edson Snowball, LCSW 07/01/2015, 10:19 AM

## 2015-07-01 NOTE — ED Notes (Signed)
Ortho tech at bedside 

## 2015-07-01 NOTE — Discharge Instructions (Signed)
Ankle Fracture  A fracture is a break in a bone. The ankle joint is made up of three bones. These include the lower (distal)sections of your lower leg bones, called the tibia and fibula, along with a bone in your foot, called the talus. Depending on how bad the break is and if more than one ankle joint bone is broken, a cast or splint is used to protect and keep your injured bone from moving while it heals. Sometimes, surgery is required to help the fracture heal properly.   There are two general types of fractures:   Stable fracture. This includes a single fracture line through one bone, with no injury to ankle ligaments. A fracture of the talus that does not have any displacement (movement of the bone on either side of the fracture line) is also stable.   Unstable fracture. This includes more than one fracture line through one or more bones in the ankle joint. It also includes fractures that have displacement of the bone on either side of the fracture line.  CAUSES   A direct blow to the ankle.    Quickly and severely twisting your ankle.   Trauma, such as a car accident or falling from a significant height.  RISK FACTORS  You may be at a higher risk of ankle fracture if:   You have certain medical conditions.   You are involved in high-impact sports.   You are involved in a high-impact car accident.  SIGNS AND SYMPTOMS    Tender and swollen ankle.   Bruising around the injured ankle.   Pain on movement of the ankle.   Difficulty walking or putting weight on the ankle.   A cold foot below the site of the ankle injury. This can occur if the blood vessels passing through your injured ankle were also damaged.   Numbness in the foot below the site of the ankle injury.  DIAGNOSIS   An ankle fracture is usually diagnosed with a physical exam and X-rays. A CT scan may also be required for complex fractures.  TREATMENT   Stable fractures are treated with a cast or splint and using crutches to avoid putting  weight on your injured ankle. This is followed by an ankle strengthening program. Some patients require a special type of cast, depending on other medical problems they may have. Unstable fractures require surgery to ensure the bones heal properly. Your health care provider will tell you what type of fracture you have and the best treatment for your condition.  HOME CARE INSTRUCTIONS    Review correct crutch use with your health care provider and use your crutches as directed. Safe use of crutches is extremely important. Misuse of crutches can cause you to fall or cause injury to nerves in your hands or armpits.   Do not put weight or pressure on the injured ankle until directed by your health care provider.   To lessen the swelling, keep the injured leg elevated while sitting or lying down.   Apply ice to the injured area:   Put ice in a plastic bag.   Place a towel between your cast and the bag.   Leave the ice on for 20 minutes, 2-3 times a day.   If you have a plaster or fiberglass cast:   Do not try to scratch the skin under the cast with any objects. This can increase your risk of skin infection.   Check the skin around the cast every day. You   may put lotion on any red or sore areas.   Keep your cast dry and clean.   If you have a plaster splint:   Wear the splint as directed.   You may loosen the elastic around the splint if your toes become numb, tingle, or turn cold or blue.   Do not put pressure on any part of your cast or splint; it may break. Rest your cast only on a pillow the first 24 hours until it is fully hardened.   Your cast or splint can be protected during bathing with a plastic bag sealed to your skin with medical tape. Do not lower the cast or splint into water.   Take medicines as directed by your health care provider. Only take over-the-counter or prescription medicines for pain, discomfort, or fever as directed by your health care provider.   Do not drive a vehicle until  your health care provider specifically tells you it is safe to do so.   If your health care provider has given you a follow-up appointment, it is very important to keep that appointment. Not keeping the appointment could result in a chronic or permanent injury, pain, and disability. If you have any problem keeping the appointment, call the facility for assistance.  SEEK MEDICAL CARE IF:  You develop increased swelling or discomfort.  SEEK IMMEDIATE MEDICAL CARE IF:    Your cast gets damaged or breaks.   You have continued severe pain.   You develop new pain or swelling after the cast was put on.   Your skin or toenails below the injury turn blue or gray.   Your skin or toenails below the injury feel cold, numb, or have loss of sensitivity to touch.   There is a bad smell or pus draining from under the cast.  MAKE SURE YOU:    Understand these instructions.   Will watch your condition.   Will get help right away if you are not doing well or get worse.  Document Released: 10/14/2000 Document Revised: 10/22/2013 Document Reviewed: 05/16/2013  ExitCare Patient Information 2015 [employer]. This information is not intended to replace advice given to you by your health care provider. Make sure you discuss any questions you have with your health care provider.

## 2015-07-01 NOTE — Progress Notes (Signed)
CSW provided patient with signed fl2, placed in patient purse as directed by patient. Patient to be transported by pt s/o. Pt s/o to arrive at [facility] at 1:45. RN can call report to [phone].   Belia Heman, [place] Work  [employer] [phone]

## 2015-07-01 NOTE — Progress Notes (Signed)
CSW provided bed offers to patient. Pt requested CSW to look into [facility], [facility], and [facility]. Pt accepted offer from [facility] pending insurance authorization. Pt to complete pt eval for insurance auth.   Belia Heman, [place] Work  [employer] [phone]

## 2015-07-01 NOTE — ED Notes (Signed)
Pt reports waking up this morning and her CBG was 555 mg/dl. Says she has been "forgetting to take it, I don't know if I'm just getting old." Took 28 units of slow acting Insulin and 8 or 10 units of fast acting insulin." Pt recently fell and has bruising (yellow/maroon) on right shoulder-no obvious deformity. Pt does live alone.

## 2015-07-01 NOTE — ED Notes (Signed)
Call placed to [facility] to give report.  Report given to [place], [place]

## 2015-07-02 LAB — URINE CULTURE: Culture: NO GROWTH

## 2015-07-03 ENCOUNTER — Encounter (HOSPITAL_COMMUNITY): Payer: Self-pay | Admitting: *Deleted

## 2015-07-03 ENCOUNTER — Other Ambulatory Visit (HOSPITAL_COMMUNITY): Payer: Self-pay | Admitting: Orthopedic Surgery

## 2015-07-03 NOTE — Progress Notes (Signed)
Anesthesia Chart Review: SAME DAY WORK-UP. Patient is a 74 year old female scheduled for ORIF of right ankle fracture tomorrow at 7:30 PM. Reportedly, she was just seen today and added to the OR schedule. She was living independently but transferred to [facility] SN[facility] after falling and fracturing her ankle on 07/01/15.   History includes squamous cell lung cancer received cycle 25 of Nivolumab immunotherapy 06/23/15), chemo-induced neuropathy, RA, HTN, GERD, hiatal hernia, DM on insulin. PCP is Dr. Wilson Singer. HEM-ONC is Dr. Mckinley Jewel.  Meds list is being faxed to [facility] to update her MAR. She is on Levemir.  07/01/15 EKG: SR, consider LAE.  06/29/15 Chest CT:  IMPRESSION: 1. Posttreatment related changes of prior radiation therapy with complete chronic collapse of right upper and middle lobes. Although this is similar to prior examinations, the right upper lobe appears slightly more bulky than the prior study, which could suggest the presence of residual/recurrent disease. Given the presence of a new trace right pleural effusion, this could be further evaluated with repeat PET-CT if clinically appropriate. 2. Additional findings, as above, similar to prior examinations. See full report for details.  11/13/13 PFTs: FVC 3.05 (99%), FEV1 2.35 (102%), DLCOunc 17.32 (67%).  Labs from 07/01/15 noted. Glucose 265. Cr 0.81. H/H 11.5/35.2.   PAT phone interviewing RN called after 4 PM to report that SNF staff said patient's fasting CBG this morning was > 300 and last night 433 (after evening snack). Notified anesthesiologist Dr. Conrad Kaplan. Patient will get a CBG on arrival. If glucose is significantly elevated, hyperglycemia will be addressed at that time. Case is currently scheduled for 7:30AM, but could start later if extra time is needed to get her glucose better controlled before heading into the OR.  George Hugh [facility]/Anesthesiology Phone [phone] 07/03/2015 4:52 PM

## 2015-07-03 NOTE — Progress Notes (Signed)
I called Dr Eugenio Hoes office for records and the office is closed.

## 2015-07-03 NOTE — Progress Notes (Signed)
Megan [name] is at [facility] SN[facility].  I spoke to [facility], patients nurse.  Anche is going to check and see if patient has family and she will notify them.

## 2015-07-04 ENCOUNTER — Ambulatory Visit (HOSPITAL_COMMUNITY)
Admission: RE | Admit: 2015-07-04 | Discharge: 2015-07-04 | Disposition: A | Discharge status: Skilled Nursing Facility | Payer: Medicare Other | Source: Ambulatory Visit | Attending: Orthopedic Surgery | Admitting: Orthopedic Surgery

## 2015-07-04 ENCOUNTER — Encounter (HOSPITAL_COMMUNITY): Payer: Self-pay | Admitting: *Deleted

## 2015-07-04 ENCOUNTER — Ambulatory Visit (HOSPITAL_COMMUNITY): Payer: Medicare Other | Admitting: Vascular Surgery

## 2015-07-04 ENCOUNTER — Encounter (HOSPITAL_COMMUNITY)
Admission: RE | Disposition: A | Discharge status: Skilled Nursing Facility | Payer: Self-pay | Source: Ambulatory Visit | Attending: Orthopedic Surgery

## 2015-07-04 DIAGNOSIS — E119 Type 2 diabetes mellitus without complications: Secondary | ICD-10-CM | POA: Insufficient documentation

## 2015-07-04 DIAGNOSIS — S82401A Unspecified fracture of shaft of right fibula, initial encounter for closed fracture: Secondary | ICD-10-CM | POA: Diagnosis not present

## 2015-07-04 DIAGNOSIS — M069 Rheumatoid arthritis, unspecified: Secondary | ICD-10-CM | POA: Insufficient documentation

## 2015-07-04 DIAGNOSIS — Z87891 Personal history of nicotine dependence: Secondary | ICD-10-CM | POA: Insufficient documentation

## 2015-07-04 DIAGNOSIS — I10 Essential (primary) hypertension: Secondary | ICD-10-CM | POA: Insufficient documentation

## 2015-07-04 DIAGNOSIS — X58XXXA Exposure to other specified factors, initial encounter: Secondary | ICD-10-CM | POA: Insufficient documentation

## 2015-07-04 DIAGNOSIS — Z794 Long term (current) use of insulin: Secondary | ICD-10-CM | POA: Insufficient documentation

## 2015-07-04 DIAGNOSIS — Z85118 Personal history of other malignant neoplasm of bronchus and lung: Secondary | ICD-10-CM | POA: Insufficient documentation

## 2015-07-04 DIAGNOSIS — Z01818 Encounter for other preprocedural examination: Secondary | ICD-10-CM

## 2015-07-04 HISTORY — PX: ORIF ANKLE FRACTURE: SHX5408

## 2015-07-04 LAB — APTT: aPTT: 27 seconds (ref 24–37)

## 2015-07-04 LAB — HEPATIC FUNCTION PANEL
ALBUMIN: 3.1 g/dL — AB (ref 3.5–5.0)
ALT: 12 U/L — ABNORMAL LOW (ref 14–54)
AST: 17 U/L (ref 15–41)
Alkaline Phosphatase: 87 U/L (ref 38–126)
Bilirubin, Direct: <0.1 mg/dL — ABNORMAL LOW (ref 0.1–0.5)
TOTAL PROTEIN: 5.2 g/dL — AB (ref 6.5–8.1)
Total Bilirubin: 0.3 mg/dL (ref 0.3–1.2)

## 2015-07-04 LAB — GLUCOSE, CAPILLARY
GLUCOSE-CAPILLARY: 76 mg/dL (ref 65–99)
Glucose-Capillary: 83 mg/dL (ref 65–99)

## 2015-07-04 LAB — PROTIME-INR
INR: 1.02 (ref 0.00–1.49)
PROTHROMBIN TIME: 13.6 s (ref 11.6–15.2)

## 2015-07-04 SURGERY — OPEN REDUCTION INTERNAL FIXATION (ORIF) ANKLE FRACTURE
Anesthesia: General | Site: Ankle | Laterality: Right

## 2015-07-04 MED ORDER — STERILE WATER FOR INJECTION IJ SOLN
INTRAMUSCULAR | Status: AC
  Filled 2015-07-04: qty 10

## 2015-07-04 MED ORDER — DEXAMETHASONE SODIUM PHOSPHATE 4 MG/ML IJ SOLN
INTRAMUSCULAR | Status: DC | PRN
  Administered 2015-07-04: 4 mg via INTRAVENOUS

## 2015-07-04 MED ORDER — EPHEDRINE SULFATE 50 MG/ML IJ SOLN
INTRAMUSCULAR | Status: AC
  Filled 2015-07-04: qty 1

## 2015-07-04 MED ORDER — PHENYLEPHRINE HCL 10 MG/ML IJ SOLN
INTRAMUSCULAR | Status: DC | PRN
  Administered 2015-07-04 (×4): 80 ug via INTRAVENOUS

## 2015-07-04 MED ORDER — CEFAZOLIN SODIUM-DEXTROSE 2-3 GM-% IV SOLR
2.0000 g | INTRAVENOUS | Status: AC
  Administered 2015-07-04: 2 g via INTRAVENOUS
  Filled 2015-07-04: qty 50

## 2015-07-04 MED ORDER — 0.9 % SODIUM CHLORIDE (POUR BTL) OPTIME
TOPICAL | Status: DC | PRN
  Administered 2015-07-04: 1000 mL

## 2015-07-04 MED ORDER — FENTANYL CITRATE (PF) 250 MCG/5ML IJ SOLN
INTRAMUSCULAR | Status: AC
  Filled 2015-07-04: qty 5

## 2015-07-04 MED ORDER — OXYCODONE HCL 5 MG/5ML PO SOLN: 5.0000 mg | Freq: Once | ORAL | Status: AC | PRN

## 2015-07-04 MED ORDER — MIDAZOLAM HCL 5 MG/5ML IJ SOLN
INTRAMUSCULAR | Status: DC | PRN
  Administered 2015-07-04: 1 mg via INTRAVENOUS

## 2015-07-04 MED ORDER — BUPIVACAINE-EPINEPHRINE (PF) 0.5% -1:200000 IJ SOLN
INTRAMUSCULAR | Status: DC | PRN
  Administered 2015-07-04: 25 mL via PERINEURAL

## 2015-07-04 MED ORDER — PROPOFOL 10 MG/ML IV BOLUS
INTRAVENOUS | Status: AC
  Filled 2015-07-04: qty 20

## 2015-07-04 MED ORDER — MIDAZOLAM HCL 2 MG/2ML IJ SOLN
INTRAMUSCULAR | Status: AC
  Filled 2015-07-04: qty 4

## 2015-07-04 MED ORDER — ONDANSETRON HCL 4 MG/2ML IJ SOLN
INTRAMUSCULAR | Status: DC | PRN
  Administered 2015-07-04: 4 mg via INTRAVENOUS

## 2015-07-04 MED ORDER — PROPOFOL 10 MG/ML IV BOLUS
INTRAVENOUS | Status: DC | PRN
  Administered 2015-07-04: 100 mg via INTRAVENOUS

## 2015-07-04 MED ORDER — MUPIROCIN 2 % EX OINT: 1.0000 "application " | TOPICAL_OINTMENT | Freq: Once | CUTANEOUS | Status: DC

## 2015-07-04 MED ORDER — OXYCODONE HCL 5 MG PO TABS
ORAL_TABLET | ORAL | Status: AC
  Filled 2015-07-04: qty 1

## 2015-07-04 MED ORDER — SUCCINYLCHOLINE CHLORIDE 20 MG/ML IJ SOLN
INTRAMUSCULAR | Status: AC
  Filled 2015-07-04: qty 1

## 2015-07-04 MED ORDER — LACTATED RINGERS IV SOLN
INTRAVENOUS | Status: DC | PRN
  Administered 2015-07-04: 07:00:00 via INTRAVENOUS

## 2015-07-04 MED ORDER — PHENYLEPHRINE 40 MCG/ML (10ML) SYRINGE FOR IV PUSH (FOR BLOOD PRESSURE SUPPORT)
PREFILLED_SYRINGE | INTRAVENOUS | Status: AC
  Filled 2015-07-04: qty 10

## 2015-07-04 MED ORDER — HYDROMORPHONE HCL 1 MG/ML IJ SOLN: 0.2500 mg | INTRAMUSCULAR | Status: DC | PRN

## 2015-07-04 MED ORDER — OXYCODONE HCL 5 MG PO TABS
5.0000 mg | ORAL_TABLET | Freq: Once | ORAL | Status: AC | PRN
  Administered 2015-07-04: 5 mg via ORAL

## 2015-07-04 MED ORDER — PROMETHAZINE HCL 25 MG/ML IJ SOLN: 6.2500 mg | INTRAMUSCULAR | Status: DC | PRN

## 2015-07-04 MED ORDER — FENTANYL CITRATE (PF) 100 MCG/2ML IJ SOLN
INTRAMUSCULAR | Status: DC | PRN
  Administered 2015-07-04: 25 ug via INTRAVENOUS

## 2015-07-04 MED ORDER — ROCURONIUM BROMIDE 50 MG/5ML IV SOLN
INTRAVENOUS | Status: AC
  Filled 2015-07-04: qty 1

## 2015-07-04 SURGICAL SUPPLY — 49 items
BANDAGE ESMARK 6X9 LF (GAUZE/BANDAGES/DRESSINGS) IMPLANT
BIT DRILL 2.5X110 QC LCP DISP (BIT) ×2 IMPLANT
BNDG CMPR 9X6 STRL LF SNTH (GAUZE/BANDAGES/DRESSINGS)
BNDG COHESIVE 4X5 TAN STRL (GAUZE/BANDAGES/DRESSINGS) ×3 IMPLANT
BNDG ESMARK 6X9 LF (GAUZE/BANDAGES/DRESSINGS)
BNDG GAUZE ELAST 4 BULKY (GAUZE/BANDAGES/DRESSINGS) ×3 IMPLANT
COVER SURGICAL LIGHT HANDLE (MISCELLANEOUS) ×6 IMPLANT
CUFF TOURNIQUET SINGLE 34IN LL (TOURNIQUET CUFF) IMPLANT
CUFF TOURNIQUET SINGLE 44IN (TOURNIQUET CUFF) IMPLANT
DRAPE INCISE IOBAN 66X45 STRL (DRAPES) ×3 IMPLANT
DRAPE OEC MINIVIEW 54X84 (DRAPES) IMPLANT
DRAPE PROXIMA HALF (DRAPES) ×3 IMPLANT
DRAPE U-SHAPE 47X51 STRL (DRAPES) ×3 IMPLANT
DRSG ADAPTIC 3X8 NADH LF (GAUZE/BANDAGES/DRESSINGS) ×3 IMPLANT
DRSG PAD ABDOMINAL 8X10 ST (GAUZE/BANDAGES/DRESSINGS) ×3 IMPLANT
DURAPREP 26ML APPLICATOR (WOUND CARE) ×3 IMPLANT
ELECT REM PT RETURN 9FT ADLT (ELECTROSURGICAL) ×3
ELECTRODE REM PT RTRN 9FT ADLT (ELECTROSURGICAL) ×1 IMPLANT
GAUZE SPONGE 4X4 12PLY STRL (GAUZE/BANDAGES/DRESSINGS) ×3 IMPLANT
GLOVE BIOGEL PI IND STRL 9 (GLOVE) ×1 IMPLANT
GLOVE BIOGEL PI INDICATOR 9 (GLOVE) ×2
GLOVE SURG ORTHO 9.0 STRL STRW (GLOVE) ×3 IMPLANT
GOWN STRL REUS W/ TWL XL LVL3 (GOWN DISPOSABLE) ×3 IMPLANT
GOWN STRL REUS W/TWL XL LVL3 (GOWN DISPOSABLE) ×9
KIT BASIN OR (CUSTOM PROCEDURE TRAY) ×3 IMPLANT
KIT ROOM TURNOVER OR (KITS) ×3 IMPLANT
MANIFOLD NEPTUNE II (INSTRUMENTS) ×3 IMPLANT
NS IRRIG 1000ML POUR BTL (IV SOLUTION) ×3 IMPLANT
PACK ORTHO EXTREMITY (CUSTOM PROCEDURE TRAY) ×3 IMPLANT
PAD ARMBOARD 7.5X6 YLW CONV (MISCELLANEOUS) ×6 IMPLANT
PLATE LCP 3.5 1/3 TUB 8HX93 (Plate) ×2 IMPLANT
SCREW CORTEX 3.5 12MM (Screw) ×6 IMPLANT
SCREW CORTEX 3.5 16MM (Screw) ×2 IMPLANT
SCREW CORTEX 3.5 18MM (Screw) ×2 IMPLANT
SCREW CORTEX 3.5 20MM (Screw) ×4 IMPLANT
SCREW LOCK CORT ST 3.5X12 (Screw) IMPLANT
SCREW LOCK CORT ST 3.5X16 (Screw) IMPLANT
SCREW LOCK CORT ST 3.5X18 (Screw) IMPLANT
SCREW LOCK CORT ST 3.5X20 (Screw) IMPLANT
SPONGE GAUZE 4X4 12PLY STER LF (GAUZE/BANDAGES/DRESSINGS) ×2 IMPLANT
SPONGE LAP 18X18 X RAY DECT (DISPOSABLE) ×3 IMPLANT
STAPLER VISISTAT 35W (STAPLE) IMPLANT
SUCTION FRAZIER TIP 10 FR DISP (SUCTIONS) ×3 IMPLANT
SUT ETHILON 2 0 PSLX (SUTURE) IMPLANT
SUT VIC AB 2-0 CTB1 (SUTURE) ×6 IMPLANT
TOWEL OR 17X24 6PK STRL BLUE (TOWEL DISPOSABLE) ×3 IMPLANT
TOWEL OR 17X26 10 PK STRL BLUE (TOWEL DISPOSABLE) ×3 IMPLANT
TUBE CONNECTING 12'X1/4 (SUCTIONS) ×1
TUBE CONNECTING 12X1/4 (SUCTIONS) ×2 IMPLANT

## 2015-07-04 NOTE — Anesthesia Postprocedure Evaluation (Signed)
  Anesthesia Post-op Note  Patient: Megan [name]  Procedure(s) Performed: Procedure(s) with comments: OPEN REDUCTION INTERNAL FIXATION (ORIF) ANKLE FRACTURE (Right) - OPEN REDUCTION, INTERNAL FIXATION OF RIGHT ANKLE FRACTURE.   Patient Location: PACU  Anesthesia Type:GA combined with regional for post-op pain  Level of Consciousness: awake, alert  and oriented  Airway and Oxygen Therapy: Patient Spontanous Breathing  Post-op Pain: none  Post-op Assessment: Post-op Vital signs reviewed              Post-op Vital Signs: Reviewed  Last Vitals:  Filed Vitals:   07/04/15 1000  BP: 111/55  Pulse: 80  Temp: 36.9 C  Resp: 17    Complications: No apparent anesthesia complications

## 2015-07-04 NOTE — Anesthesia Procedure Notes (Addendum)
Anesthesia Regional Block:  Popliteal block  Pre-Anesthetic Checklist: ,, timeout performed, Correct Patient, Correct Site, Correct Laterality, Correct Procedure, Correct Position, site marked, Risks and benefits discussed,  Surgical consent,  Pre-op evaluation,  At surgeon's request and post-op pain management  Laterality: Right  Prep: chloraprep       Needles:  Injection technique: Single-shot  Needle Type: Echogenic Stimulator Needle     Needle Length: 9cm 9 cm Needle Gauge: 21 and 21 G    Additional Needles:  Procedures: ultrasound guided (picture in chart) Popliteal block Narrative:  Injection made incrementally with aspirations every 5 mL.  Performed by: Personally  Anesthesiologist: Suzette Battiest  Additional Notes: Risks and benefits discussed. Pt tolerated well with no immediate complications.   Procedure Name: LMA Insertion Date/Time: 07/04/2015 7:49 AM Performed by: Ollen Bowl Pre-anesthesia Checklist: Patient identified, Emergency Drugs available, Suction available, Patient being monitored and Timeout performed Patient Re-evaluated:Patient Re-evaluated prior to inductionOxygen Delivery Method: Circle system utilized and Simple face mask Preoxygenation: Pre-oxygenation with 100% oxygen Intubation Type: IV induction Ventilation: Mask ventilation without difficulty LMA: LMA inserted LMA Size: 4.0 Number of attempts: 1 Airway Equipment and Method: Patient positioned with wedge pillow and Stylet Placement Confirmation: positive ETCO2 and breath sounds checked- equal and bilateral Tube secured with: Tape Dental Injury: Teeth and Oropharynx as per pre-operative assessment

## 2015-07-04 NOTE — Transfer of Care (Signed)
Immediate Anesthesia Transfer of Care Note  Patient: Megan [name]  Procedure(s) Performed: Procedure(s) with comments: OPEN REDUCTION INTERNAL FIXATION (ORIF) ANKLE FRACTURE (Right) - OPEN REDUCTION, INTERNAL FIXATION OF RIGHT ANKLE FRACTURE.   Patient Location: PACU  Anesthesia Type:GA combined with regional for post-op pain  Level of Consciousness: awake, alert  and oriented  Airway & Oxygen Therapy: Patient Spontanous Breathing and Patient connected to nasal cannula oxygen  Post-op Assessment: Report given to RN and Post -op Vital signs reviewed and stable  Post vital signs: Reviewed and stable  Last Vitals:  Filed Vitals:   07/04/15 0620  BP: 126/52  Pulse: 83  Temp: 36.8 C  Resp: 18    Complications: No apparent anesthesia complications

## 2015-07-04 NOTE — H&P (Signed)
Megan [name] is an 74 y.o. female.   Chief Complaint: Right ankle fracture HPI: Patient is a 74 year old woman with acute right ankle fracture with unstable ankle.  Past Medical History  Diagnosis Date  . Hypertension   . Rheumatoid arthritis   . GERD (gastroesophageal reflux disease)     no meds for  . Hx of radiation therapy 12/16/13-01/30/14    lung 66Gy  . Neuropathy   . Shortness of breath dyspnea     with exertion  . History of hiatal hernia   . Diabetes mellitus     insulin  . Cancer dx'd 09/2013    Lung ca    Past Surgical History  Procedure Laterality Date  . Cholecystectomy    . Abdominal hysterectomy    . Dilation and curettage of uterus    . Abdominal surgery    . Tonsillectomy    . Esophagogastroduodenoscopy (egd) with propofol N/A 10/09/2014    Procedure: ESOPHAGOGASTRODUODENOSCOPY (EGD) WITH PROPOFOL;  Surgeon: Inda Castle, MD;  Location: [facility];  Service: Endoscopy;  Laterality: N/A;  . [place] dilation N/A 10/09/2014    Procedure: SAVORY DILATION;  Surgeon: Inda Castle, MD;  Location: [facility];  Service: Endoscopy;  Laterality: N/A;  . Esophagogastroduodenoscopy N/A 12/12/2014    Procedure: ESOPHAGOGASTRODUODENOSCOPY (EGD);  Surgeon: Inda Castle, MD;  Location: [facility];  Service: Endoscopy;  Laterality: N/A;  with dilation  . Balloon dilation N/A 12/12/2014    Procedure: BALLOON DILATION;  Surgeon: Inda Castle, MD;  Location: [facility];  Service: Endoscopy;  Laterality: N/A;    Family History  Problem Relation Age of Onset  . Hypertension Other   . Stroke Other   . Heart attack Other   . Hemachromatosis Other   . Rheum arthritis      both sets of grandparents and father  . Other Mother   . Other Father     failure to thrive   Social History:  reports that she quit smoking about 17 years ago. Her smoking use included Cigarettes. She has a 135 pack-year smoking history. She has never used smokeless tobacco. She  reports that she does not drink alcohol or use illicit drugs.  Allergies:  Allergies  Allergen Reactions  . Remicade [Infliximab] Anaphylaxis  . Carboplatin Other (See Comments)    Unknown reaction (listed on [facility] 07/03/15)  . Shellfish-Derived Products Diarrhea    " Crab cakes"  . Hydromorphone Rash    Medications Prior to Admission  Medication Sig Dispense Refill  . fluticasone (CUTIVATE) 0.05 % cream Apply 1 application topically 2 (two) times daily as needed (rash).     . Fluticasone-Salmeterol (ADVAIR DISKUS) 100-50 MCG/DOSE AEPB Inhale 1 puff into the lungs 2 (two) times daily. 60 each 5  . Gabapentin, Once-Daily, (GRALISE) 600 MG TABS Take 1,800 mg by mouth daily. 90 tablet 11  . HYDROcodone-acetaminophen (NORCO/VICODIN) 5-325 MG per tablet Take 1 tablet by mouth every 4 (four) hours as needed. (Patient taking differently: Take 1 tablet by mouth every 4 (four) hours as needed (pain). ) 20 tablet 0  . ibuprofen (ADVIL,MOTRIN) 600 MG tablet Take 1 tablet (600 mg total) by mouth every 6 (six) hours as needed. Take with food. (Patient taking differently: Take 600 mg by mouth every 6 (six) hours as needed (pain). Take with food.) 30 tablet 0  . insulin detemir (LEVEMIR) 100 UNIT/ML injection Inject 28 Units into the skin at bedtime.    . insulin lispro (HUMALOG) 100 UNIT/ML  injection Inject 6 Units into the skin 4 (four) times daily - after meals and at bedtime.    . lamoTRIgine (LAMICTAL) 25 MG tablet Take 25-50 mg by mouth See admin instructions. Take 2 tablets (50 mg) by mouth daily at bedtime through 07/04/15, then take 1 tablet (25 mg) daily at bedtime for 2 weeks, then stop    . pantoprazole (PROTONIX) 40 MG tablet TAKE 1 TABLET BY MOUTH DAILY 30 tablet 0  . potassium chloride SA (K-DUR,KLOR-CON) 20 MEQ tablet Take 40 mEq by mouth daily.    . prochlorperazine (COMPAZINE) 10 MG tablet Take 10 mg by mouth every 6 (six) hours as needed for nausea or vomiting.    . sulfaSALAzine (AZULFIDINE)  500 MG EC tablet Take 1,000 mg by mouth 2 (two) times daily.    [name] venlafaxine (EFFEXOR) 37.5 MG tablet Take 37.5 mg by mouth daily. For hot flashes    . zolpidem (AMBIEN) 10 MG tablet Take 10 mg by mouth at bedtime as needed for sleep.    . B-D ULTRAFINE III SHORT PEN 31G X 8 MM MISC   0  . docusate sodium (COLACE) 100 MG capsule Take 100 mg by mouth 2 (two) times daily as needed for mild constipation.    [name] dronabinol (MARINOL) 10 MG capsule Take 2 capsules (20 mg total) by mouth 2 (two) times daily before a meal. (Patient not taking: Reported on 07/01/2015) 120 capsule 3  . DULoxetine (CYMBALTA) 60 MG capsule Take 1 capsule (60 mg total) by mouth daily. (Patient not taking: Reported on 07/03/2015) 30 capsule 11  . LamoTRIgine (LAMICTAL XR) 25 MG TB24 tablet Take 4 tablets (100 mg total) by mouth daily. Start with 2 pills at night for 2 weeks. Than increase to 4 pills at night. Stop for rash. (Patient not taking: Reported on 07/03/2015) 120 tablet 11  . lansoprazole (PREVACID SOLUTAB) 30 MG disintegrating tablet Take 1 tablet (30 mg total) by mouth daily. (Patient not taking: Reported on 07/01/2015) 90 tablet 3  . lidocaine (LIDODERM) 5 % APPLY 3 PATCHES EXTERNALLY TO THE SKIN EVERY DAY. LEAVE ON FOR 12 HOURS AND OFF FOR 12 HOURS. MAY CUT PATCH TO FIT (Patient not taking: Reported on 07/03/2015) 90 patch 3  . ONETOUCH DELICA LANCETS FINE MISC   0  . ONETOUCH VERIO test strip   5    Results for orders placed or performed during the hospital encounter of 07/04/15 (from the past 48 hour(s))  Glucose, capillary     Status: None   Collection Time: 07/04/15  6:19 AM  Result Value Ref Range   Glucose-Capillary 83 65 - 99 mg/dL   No results found.  Review of Systems  All other systems reviewed and are negative.   Blood pressure 126/52, pulse 83, temperature 98.2 F (36.8 C), resp. rate 18, height '5\' 6"'$  (1.676 m), weight 60.328 kg (133 lb), SpO2 98 %. Physical Exam  On examination patient's foot is  neurovascularly intact. Radiograph shows a long oblique Weber B fibular fracture. Assessment/Plan Assessment: Right ankle long oblique Weber B fibular fracture.  Plan: We will plan for open reduction internal fixation risks and benefits were discussed including infection neurovascular injury DVT need for additional surgery. Patient states she understands and wishes to proceed at this time.  DUDA,MARCUS V 07/04/2015, 7:06 AM

## 2015-07-04 NOTE — Progress Notes (Signed)
Patient had watch in belongings that did not have back on it. Patient notified prior to placing in belongings bag

## 2015-07-04 NOTE — Anesthesia Preprocedure Evaluation (Addendum)
Anesthesia Evaluation  Patient identified by MRN, date of birth, ID band Patient awake    Reviewed: Allergy & Precautions, NPO status , Patient's Chart, lab work & pertinent test results  Airway Mallampati: II  TM Distance: >3 FB Neck ROM: Full  Mouth opening: Limited Mouth Opening  Dental  (+) Teeth Intact, Dental Advisory Given   Pulmonary shortness of breath and with exertion, COPDformer smoker,  S/p lung radiation breath sounds clear to auscultation        Cardiovascular hypertension, Pt. on medications Rhythm:Regular Rate:Normal     Neuro/Psych Depression negative neurological ROS     GI/Hepatic Neg liver ROS, hiatal hernia, GERD-  ,  Endo/Other  diabetes, Insulin Dependent  Renal/GU negative Renal ROS     Musculoskeletal  (+) Arthritis -,   Abdominal   Peds  Hematology  (+) anemia ,   Anesthesia Other Findings   Reproductive/Obstetrics                           Anesthesia Physical Anesthesia Plan  ASA: III  Anesthesia Plan: General   Post-op Pain Management: MAC Combined w/ Regional for Post-op pain   Induction: Intravenous  Airway Management Planned: LMA  Additional Equipment:   Intra-op Plan:   Post-operative Plan: Extubation in OR  Informed Consent: I have reviewed the patients History and Physical, chart, labs and discussed the procedure including the risks, benefits and alternatives for the proposed anesthesia with the patient or authorized representative who has indicated his/her understanding and acceptance.   Dental advisory given  Plan Discussed with:   Anesthesia Plan Comments:        Anesthesia Quick Evaluation

## 2015-07-04 NOTE — Progress Notes (Signed)
Orthopedic Tech Progress Note Patient Details:  Megan [name] 09-25-1941 [id number]  Ortho Devices Type of Ortho Device: CAM walker Ortho Device/Splint Location: rle Ortho Device/Splint Interventions: Application   Armeda Plumb 07/04/2015, 10:04 AM

## 2015-07-04 NOTE — Op Note (Signed)
07/04/2015  8:42 AM  PATIENT:  Megan [name]    PRE-OPERATIVE DIAGNOSIS:  Right Fibula Fracture  POST-OPERATIVE DIAGNOSIS:  Same  PROCEDURE:  OPEN REDUCTION INTERNAL FIXATION (ORIF) ANKLE FRACTURE  SURGEON:  Newt Minion, MD  PHYSICIAN ASSISTANT:None ANESTHESIA:   General  PREOPERATIVE INDICATIONS:  [name] is a  74 y.o. female with a diagnosis of Right Fibula Fracture who failed conservative measures and elected for surgical management.    The risks benefits and alternatives were discussed with the patient preoperatively including but not limited to the risks of infection, bleeding, nerve injury, cardiopulmonary complications, the need for revision surgery, among others, and the patient was willing to proceed.  OPERATIVE IMPLANTS: Synthes one third tubular plate 8 hole plate  OPERATIVE FINDINGS: Long oblique fracture of the fibula  OPERATIVE PROCEDURE: Patient was brought to the operating room and underwent a general and aesthetic. After adequate levels anesthesia obtained patient's right lower extremity was prepped using DuraPrep draped into a sterile field. A timeout was called. A lateral incision was made over the fibula. This was carried sharply down to bone. Subperiosteal dissection was used to freshen the fracture site. The fracture was reduced and 2 interfragmentary lag screws were placed to secure the fracture. A anti-glide plate was then placed laterally and also secured with compression screws. C-arm floss be verified alignment of the bone verified congruence of the mortise. The wound was irrigated with normal saline. The incision was closed using 2-0 nylon. A sterile compressive dressing was applied. Patient was extubated taken to the PACU in stable condition.

## 2015-07-06 NOTE — Discharge Summary (Signed)
Physician Discharge Summary  Patient ID: Megan [name] MRN: [id number] DOB/AGE: 1941-01-25 74 y.o.  Admit date: 07/04/2015 Discharge date: 07/06/2015  Admission Diagnoses: Right fibular fracture  Discharge Diagnoses: Right fibular fracture Active Problems:   * No active hospital problems. *   Discharged Condition: stable  Hospital Course: Patient's hospital course was essentially unremarkable. She underwent open reduction internal fixation of the fibular fracture and was discharged to home in stable condition  Consults: None  Significant Diagnostic Studies: labs: Routine labs  Treatments: surgery: See operative note  Discharge Exam: Blood pressure 111/55, pulse 80, temperature 98.5 F (36.9 C), resp. rate 17, height '5\' 6"'$  (1.676 m), weight 60.328 kg (133 lb), SpO2 97 %. Incision/Wound: dressing clean and dry  Disposition: 03-Skilled Nursing Facility  Discharge Instructions    Apply cam walker    Complete by:  As directed   Laterality:  Right     Call MD / Call 911    Complete by:  As directed   If you experience chest pain or shortness of breath, CALL 911 and be transported to the hospital emergency room.  If you develope a fever above 101 F, pus (white drainage) or increased drainage or redness at the wound, or calf pain, call your surgeon's office.     Constipation Prevention    Complete by:  As directed   Drink plenty of fluids.  Prune juice may be helpful.  You may use a stool softener, such as Colace (over the counter) 100 mg twice a day.  Use MiraLax (over the counter) for constipation as needed.     Diet - low sodium heart healthy    Complete by:  As directed      Elevate operative extremity    Complete by:  As directed      Increase activity slowly as tolerated    Complete by:  As directed      Touch down weight bearing    Complete by:  As directed   Laterality:  right  Extremity:  Lower            Medication List    STOP taking these medications         dronabinol 10 MG capsule  Commonly known as:  MARINOL     DULoxetine 60 MG capsule  Commonly known as:  CYMBALTA     lansoprazole 30 MG disintegrating tablet  Commonly known as:  PREVACID SOLUTAB     lidocaine 5 %  Commonly known as:  LIDODERM      TAKE these medications        B-D ULTRAFINE III SHORT PEN 31G X 8 MM Misc  Generic drug:  Insulin Pen Needle     docusate sodium 100 MG capsule  Commonly known as:  COLACE  Take 100 mg by mouth 2 (two) times daily as needed for mild constipation.     fluticasone 0.05 % cream  Commonly known as:  CUTIVATE  Apply 1 application topically 2 (two) times daily as needed (rash).     Fluticasone-Salmeterol 100-50 MCG/DOSE Aepb  Commonly known as:  ADVAIR DISKUS  Inhale 1 puff into the lungs 2 (two) times daily.     Gabapentin (Once-Daily) 600 MG Tabs  Commonly known as:  GRALISE  Take 1,800 mg by mouth daily.     HYDROcodone-acetaminophen 5-325 MG per tablet  Commonly known as:  NORCO/VICODIN  Take 1 tablet by mouth every 4 (four) hours as needed.     ibuprofen 600  MG tablet  Commonly known as:  ADVIL,MOTRIN  Take 1 tablet (600 mg total) by mouth every 6 (six) hours as needed. Take with food.     insulin detemir 100 UNIT/ML injection  Commonly known as:  LEVEMIR  Inject 28 Units into the skin at bedtime.     insulin lispro 100 UNIT/ML injection  Commonly known as:  HUMALOG  Inject 6 Units into the skin 4 (four) times daily - after meals and at bedtime.     lamoTRIgine 25 MG tablet  Commonly known as:  LAMICTAL  Take 25-50 mg by mouth See admin instructions. Take 2 tablets (50 mg) by mouth daily at bedtime through 07/04/15, then take 1 tablet (25 mg) daily at bedtime for 2 weeks, then stop     ONETOUCH DELICA LANCETS FINE Misc     ONETOUCH VERIO test strip  Generic drug:  glucose blood     pantoprazole 40 MG tablet  Commonly known as:  PROTONIX  TAKE 1 TABLET BY MOUTH DAILY     potassium chloride SA 20 MEQ tablet   Commonly known as:  K-DUR,KLOR-CON  Take 40 mEq by mouth daily.     prochlorperazine 10 MG tablet  Commonly known as:  COMPAZINE  Take 10 mg by mouth every 6 (six) hours as needed for nausea or vomiting.     sulfaSALAzine 500 MG EC tablet  Commonly known as:  AZULFIDINE  Take 1,000 mg by mouth 2 (two) times daily.     venlafaxine 37.5 MG tablet  Commonly known as:  EFFEXOR  Take 37.5 mg by mouth daily. For hot flashes     zolpidem 10 MG tablet  Commonly known as:  AMBIEN  Take 10 mg by mouth at bedtime as needed for sleep.           Follow-up Information    Follow up with Imogene Gravelle V, MD In 2 weeks.   Specialty:  Orthopedic Surgery   Contact information:   [place] [address] [phone]       Signed: Newt Minion 07/06/2015, 7:58 AM

## 2015-07-07 ENCOUNTER — Other Ambulatory Visit (HOSPITAL_BASED_OUTPATIENT_CLINIC_OR_DEPARTMENT_OTHER): Payer: Medicare Other

## 2015-07-07 ENCOUNTER — Ambulatory Visit: Payer: Medicare Other

## 2015-07-07 ENCOUNTER — Ambulatory Visit (HOSPITAL_BASED_OUTPATIENT_CLINIC_OR_DEPARTMENT_OTHER): Payer: Medicare Other | Admitting: Internal Medicine

## 2015-07-07 ENCOUNTER — Telehealth: Payer: Self-pay | Admitting: Internal Medicine

## 2015-07-07 ENCOUNTER — Encounter (HOSPITAL_COMMUNITY): Payer: Self-pay | Admitting: Orthopedic Surgery

## 2015-07-07 ENCOUNTER — Ambulatory Visit (HOSPITAL_BASED_OUTPATIENT_CLINIC_OR_DEPARTMENT_OTHER): Payer: Medicare Other

## 2015-07-07 VITALS — BP 114/67 | HR 95 | Temp 98.5°F | Resp 18

## 2015-07-07 DIAGNOSIS — C342 Malignant neoplasm of middle lobe, bronchus or lung: Secondary | ICD-10-CM

## 2015-07-07 DIAGNOSIS — Z5112 Encounter for antineoplastic immunotherapy: Secondary | ICD-10-CM | POA: Diagnosis not present

## 2015-07-07 DIAGNOSIS — R300 Dysuria: Secondary | ICD-10-CM

## 2015-07-07 DIAGNOSIS — Z79899 Other long term (current) drug therapy: Secondary | ICD-10-CM

## 2015-07-07 LAB — URINALYSIS, MICROSCOPIC - CHCC
BILIRUBIN (URINE): NEGATIVE
Blood: NEGATIVE
Glucose: 1000 mg/dL
KETONES: 160 mg/dL
Nitrite: NEGATIVE
SPECIFIC GRAVITY, URINE: 1.025 (ref 1.003–1.035)
Urobilinogen, UR: 0.2 mg/dL (ref 0.2–1)
pH: 5 (ref 4.6–8.0)

## 2015-07-07 LAB — TSH CHCC: TSH: 7.74 m[IU]/L — AB (ref 0.308–3.960)

## 2015-07-07 MED ORDER — SODIUM CHLORIDE 0.9 % IV SOLN
3.0000 mg/kg | Freq: Once | INTRAVENOUS | Status: AC
  Administered 2015-07-07: 180 mg via INTRAVENOUS
  Filled 2015-07-07: qty 14

## 2015-07-07 MED ORDER — SODIUM CHLORIDE 0.9 % IV SOLN
Freq: Once | INTRAVENOUS | Status: AC
  Administered 2015-07-07: 14:00:00 via INTRAVENOUS

## 2015-07-07 NOTE — Progress Notes (Signed)
Megan [name] [phone]   [phone]  OFFICE PROGRESS NOTE  [name], MD [address] [place] [address]  DIAGNOSIS: Squamous cell lung cancer  Primary site: Lung (Right)  Staging method: AJCC 7th Edition  Clinical: Stage IIIB (T3, N3, M0)  Summary: Stage IIIB (T3, N3, M0)   PRIOR THERAPY:  1) Concurrent chemoradiation with weekly chemotherapy in the form of carboplatin for AUC of 2 and paclitaxel 45 mg/m2. Status post 7 week of treatment. Last dose was given 01/27/2014 no significant response in her disease.  2) Consolidation chemotherapy with carboplatin for AUC of 5 and paclitaxel 175 mg/M2 every 3 weeks with Neulasta support. First dose on 04/15/2014. Status post 3 cycles. Carboplatin was discontinued starting cycle #2 secondary to hypersensitivity reaction.  CURRENT THERAPY: Immunotherapy with Opdivo (Nivolumab) '3mg'$ /kg given every 2 weeks. Status post 25 cycles.  CHEMOTHERAPY INTENT: Control/Palliative  CURRENT # OF CHEMOTHERAPY CYCLES: 24 CURRENT ANTIEMETICS: Zofran, dexamethasone, Compazine  CURRENT SMOKING STATUS: Former smoker, quit 12/05/1997  ORAL CHEMOTHERAPY AND CONSENT: n/a  CURRENT BISPHOSPHONATES USE: None  PAIN MANAGEMENT: no cancer related pain, patient does have RA  NARCOTICS INDUCED CONSTIPATION: none  LIVING WILL AND CODE STATUS: ?  INTERVAL HISTORY: Megan [name] 74 y.o. female returns to the clinic today for follow-up visit. The patient is currently on immunotherapy with Nivolumab status post 25 cycles. She is tolerating her treatment well with no significant adverse effects. She recently had a syncopal episode and fracture of her right ankle. She underwent open reduction with internal fixation of the right ankle fracture. She is currently a resident of the [facility] skilled nursing facility. She is feeling fine today except for dysuria. She denied having any significant  weight loss or night sweats. The patient denied having any chest pain, shortness of breath, cough or hemoptysis. She has no significant nausea or vomiting, no fever or chills. She had repeat CT scan of the chest performed recently and she is here for evaluation and discussion of her scan results.  MEDICAL HISTORY: Past Medical History  Diagnosis Date  . Hypertension   . Rheumatoid arthritis   . GERD (gastroesophageal reflux disease)     no meds for  . Hx of radiation therapy 12/16/13-01/30/14    lung 66Gy  . Neuropathy   . Shortness of breath dyspnea     with exertion  . History of hiatal hernia   . Diabetes mellitus     insulin  . Cancer dx'd 09/2013    Lung ca    ALLERGIES:  is allergic to remicade; carboplatin; shellfish-derived products; and hydromorphone.  MEDICATIONS:  Current Outpatient Prescriptions  Medication Sig Dispense Refill  . B-D ULTRAFINE III SHORT PEN 31G X 8 MM MISC   0  . docusate sodium (COLACE) 100 MG capsule Take 100 mg by mouth 2 (two) times daily as needed for mild constipation.    . fluticasone (CUTIVATE) 0.05 % cream Apply 1 application topically 2 (two) times daily as needed (rash).     . Fluticasone-Salmeterol (ADVAIR DISKUS) 100-50 MCG/DOSE AEPB Inhale 1 puff into the lungs 2 (two) times daily. 60 each 5  . Gabapentin, Once-Daily, (GRALISE) 600 MG TABS Take 1,800 mg by mouth daily. 90 tablet 11  . HYDROcodone-acetaminophen (NORCO/VICODIN) 5-325 MG per tablet Take 1 tablet by mouth every 4 (four) hours as needed. (Patient taking differently: Take 1 tablet by mouth every 4 (four) hours as needed (pain). ) 20 tablet 0  .  ibuprofen (ADVIL,MOTRIN) 600 MG tablet Take 1 tablet (600 mg total) by mouth every 6 (six) hours as needed. Take with food. (Patient taking differently: Take 600 mg by mouth every 6 (six) hours as needed (pain). Take with food.) 30 tablet 0  . insulin detemir (LEVEMIR) 100 UNIT/ML injection Inject 28 Units into the skin at bedtime.    .  insulin lispro (HUMALOG) 100 UNIT/ML injection Inject 6 Units into the skin 4 (four) times daily - after meals and at bedtime.    . lamoTRIgine (LAMICTAL) 25 MG tablet Take 25-50 mg by mouth See admin instructions. Take 2 tablets (50 mg) by mouth daily at bedtime through 07/04/15, then take 1 tablet (25 mg) daily at bedtime for 2 weeks, then stop    . ONETOUCH DELICA LANCETS FINE MISC   0  . ONETOUCH VERIO test strip   5  . pantoprazole (PROTONIX) 40 MG tablet TAKE 1 TABLET BY MOUTH DAILY 30 tablet 0  . potassium chloride SA (K-DUR,KLOR-CON) 20 MEQ tablet Take 40 mEq by mouth daily.    . prochlorperazine (COMPAZINE) 10 MG tablet Take 10 mg by mouth every 6 (six) hours as needed for nausea or vomiting.    . sulfaSALAzine (AZULFIDINE) 500 MG EC tablet Take 1,000 mg by mouth 2 (two) times daily.    [name] venlafaxine (EFFEXOR) 37.5 MG tablet Take 37.5 mg by mouth daily. For hot flashes    . zolpidem (AMBIEN) 10 MG tablet Take 10 mg by mouth at bedtime as needed for sleep.     No current facility-administered medications for this visit.   Facility-Administered Medications Ordered in Other Visits  Medication Dose Route Frequency Provider Last Rate Last Dose  . sodium chloride 0.9 % injection 10 mL  10 mL Intracatheter PRN Curt Bears, MD   10 mL at 12/30/13 1121    SURGICAL HISTORY:  Past Surgical History  Procedure Laterality Date  . Cholecystectomy    . Abdominal hysterectomy    . Dilation and curettage of uterus    . Abdominal surgery    . Tonsillectomy    . Esophagogastroduodenoscopy (egd) with propofol N/A 10/09/2014    Procedure: ESOPHAGOGASTRODUODENOSCOPY (EGD) WITH PROPOFOL;  Surgeon: Inda Castle, MD;  Location: [facility];  Service: Endoscopy;  Laterality: N/A;  . Savory dilation N/A 10/09/2014    Procedure: SAVORY DILATION;  Surgeon: Inda Castle, MD;  Location: [facility];  Service: Endoscopy;  Laterality: N/A;  . Esophagogastroduodenoscopy N/A 12/12/2014    Procedure:  ESOPHAGOGASTRODUODENOSCOPY (EGD);  Surgeon: Inda Castle, MD;  Location: [facility];  Service: Endoscopy;  Laterality: N/A;  with dilation  . Balloon dilation N/A 12/12/2014    Procedure: BALLOON DILATION;  Surgeon: Inda Castle, MD;  Location: [facility];  Service: Endoscopy;  Laterality: N/A;  . Orif ankle fracture Right 07/04/2015    Procedure: OPEN REDUCTION INTERNAL FIXATION (ORIF) ANKLE FRACTURE;  Surgeon: Newt Minion, MD;  Location: [facility];  Service: Orthopedics;  Laterality: Right;  OPEN REDUCTION, INTERNAL FIXATION OF RIGHT ANKLE FRACTURE.     REVIEW OF SYSTEMS:  Constitutional: negative Eyes: negative Ears, nose, mouth, throat, and face: negative Respiratory: positive for dyspnea on exertion Cardiovascular: negative Gastrointestinal: negative Genitourinary:negative Integument/breast: negative Hematologic/lymphatic: negative Musculoskeletal:positive for bone pain Neurological: negative Behavioral/Psych: negative Endocrine: negative Allergic/Immunologic: negative   PHYSICAL EXAMINATION: General appearance: alert, cooperative, fatigued and no distress Head: Normocephalic, without obvious abnormality, atraumatic Neck: no adenopathy, no JVD, supple, symmetrical, trachea midline and thyroid not enlarged, symmetric, no  tenderness/mass/nodules Lymph nodes: Cervical, supraclavicular, and axillary nodes normal. Resp: clear to auscultation bilaterally Back: symmetric, no curvature. ROM normal. No CVA tenderness. Cardio: regular rate and rhythm, S1, S2 normal, no murmur, click, rub or gallop GI: soft, non-tender; bowel sounds normal; no masses,  no organomegaly Extremities: extremities normal, atraumatic, no cyanosis or edema Neurologic: Alert and oriented X 3, normal strength and tone. Normal symmetric reflexes. Normal coordination and gait  ECOG PERFORMANCE STATUS: 1 - Symptomatic but completely ambulatory  There were no vitals taken for this visit.  LABORATORY DATA: Lab  Results  Component Value Date   WBC 4.9 07/01/2015   HGB 11.5* 07/01/2015   HCT 35.2* 07/01/2015   MCV 95.9 07/01/2015   PLT 140* 07/01/2015      Chemistry      Component Value Date/Time   NA 135 07/01/2015 0127   NA 139 06/23/2015 1311   K 3.5 07/01/2015 0127   K 3.8 06/23/2015 1311   CL 100* 07/01/2015 0127   CO2 28 07/01/2015 0127   CO2 26 06/23/2015 1311   BUN 19 07/01/2015 0127   BUN 15.1 06/23/2015 1311   CREATININE 0.81 07/01/2015 0127   CREATININE 0.8 06/23/2015 1311   GLU 299* 02/02/2015 1348      Component Value Date/Time   CALCIUM 8.6* 07/01/2015 0127   CALCIUM 8.9 06/23/2015 1311   ALKPHOS 87 07/04/2015 0649   ALKPHOS 113 06/23/2015 1311   AST 17 07/04/2015 0649   AST 16 06/23/2015 1311   ALT 12* 07/04/2015 0649   ALT 17 06/23/2015 1311   BILITOT 0.3 07/04/2015 0649   BILITOT 0.48 06/23/2015 1311       RADIOGRAPHIC STUDIES: Dg Tibia/fibula Right  07/01/2015   CLINICAL DATA:  [place] getting out of bed this morning  EXAM: RIGHT TIBIA AND FIBULA - 2 VIEW  COMPARISON:  None.  FINDINGS: The distal fibular fracture is again evident. The proximal and midportions of the fibula and tibia are intact.  IMPRESSION: Proximal and midportions of the tibia and fibula are intact.   Electronically Signed   By: Andreas Newport M.D.   On: 07/01/2015 01:54   Dg Ankle Complete Right  07/01/2015   CLINICAL DATA:  [place] this morning.  EXAM: RIGHT ANKLE - COMPLETE 3+ VIEW  COMPARISON:  None.  FINDINGS: There is a long oblique fracture of the distal fibula beginning 8-10 cm above the tibiotalar joint line and extending down to the level of the tailor dome. There is approximately 1/2 shaft width posterolateral displacement about this fracture. There is a fracture of the posterior lateral tibia with a few mm displacement. There is a small fracture off the medial malleolar tip. There is mild asymmetric widening of the mortise medially.  IMPRESSION: Fractures of the distal fibula and  posterior tibia. Small medial malleolar tip fragment.   Electronically Signed   By: Andreas Newport M.D.   On: 07/01/2015 01:52   Ct Chest W Contrast  06/29/2015   CLINICAL DATA:  74 year old female with history of right-sided lung cancer diagnosed in 2014. Restaging examination.  EXAM: CT CHEST WITH CONTRAST  TECHNIQUE: Multidetector CT imaging of the chest was performed during intravenous contrast administration.  CONTRAST:  33m OMNIPAQUE IOHEXOL 300 MG/ML  SOLN  COMPARISON:  Multiple prior chest CTs, most recently 05/08/2015.  FINDINGS: Mediastinum/Lymph Nodes: Heart size is normal. There is no significant pericardial fluid, thickening or pericardial calcification. There is atherosclerosis of the thoracic aorta, the great vessels of the mediastinum and the  coronary arteries, including calcified atherosclerotic plaque in the left anterior descending coronary artery. Prominent subcarinal nodal tissue measuring 13 mm in short axis, stable compared to recent prior examinations. No other definite mediastinal lymphadenopathy. Soft tissue fullness in the right hilar region, similar to prior examinations, likely post treatment related. No left hilar lymphadenopathy. Numerous densely calcified left hilar lymph nodes again noted. Esophagus is unremarkable in appearance. No axillary lymphadenopathy.  Lungs/Pleura: There continues to be complete chronic collapse of the right upper and middle lobes, similar to prior examinations. Notably however, the overall bulk of the collapsed right upper lobe in particular appears more substantial than prior studies measuring up to 21 mm in thickness on today's examination (compared with 13 mm on the prior). Architectural distortion in the hyperinflated right lower lobe, presumably from some chronic postradiation fibrosis. Trace right pleural effusion lying dependently. Small calcified granuloma in the left lower lobe. Other 2 mm left lower lobe pulmonary nodule is unchanged  compared to prior examinations, favored to be benign. No acute consolidative airspace disease.  Upper Abdomen: 15 mm simple cyst in the left lobe of the liver between segments 2/3 is unchanged, as is a 9 mm low attenuation lesion in segment 4A which is too small to characterize, but favored to represent a tiny cyst. Status post cholecystectomy. Calcified granulomas in the spleen.  Musculoskeletal/Soft Tissues: Healing nondisplaced fracture of the anterior aspect of the right second rib in an area of mixed lucency and sclerosis, likely a pathologic fracture at site of radiation osteonecrosis. There are otherwise no aggressive appearing lytic or blastic lesions noted in the visualized portions of the skeleton.  IMPRESSION: 1. Posttreatment related changes of prior radiation therapy with complete chronic collapse of right upper and middle lobes. Although this is similar to prior examinations, the right upper lobe appears slightly more bulky than the prior study, which could suggest the presence of residual/recurrent disease. Given the presence of a new trace right pleural effusion, this could be further evaluated with repeat PET-CT if clinically appropriate. 2. Additional findings, as above, similar to prior examinations.   Electronically Signed   By: Vinnie Langton M.D.   On: 06/29/2015 08:59   Dg Foot Complete Right  07/01/2015   CLINICAL DATA:  [place] getting out of bed this morning.  EXAM: RIGHT FOOT COMPLETE - 3+ VIEW  COMPARISON:  None.  FINDINGS: The fractures of the distal fibula and tibia are again evident. The foot is intact, with no evidence of acute fracture or dislocation. There is mild hallux valgus.  IMPRESSION: Fractures of the distal tibia and fibula. Negative for acute fracture about the foot.   Electronically Signed   By: Andreas Newport M.D.   On: 07/01/2015 01:53    ASSESSMENT AND PLAN: This is a very pleasant 74 years old white female with metastatic non-small cell lung cancer, squamous  cell carcinoma who is currently undergoing treatment with immunotherapy with Nivolumab status post 25 cycles and tolerating her treatment fairly well. The recent CT scan of the chest showed stable disease. I discussed the scan results with the patient today. I recommended for the patient to proceed with cycle #26 today as a scheduled. She will come back for follow-up visit in 2 weeks for reevaluation before starting the next cycle of her treatment. For the dysuria, I ordered a urinalysis to rule out urinary tract infection. For the recent right ankle fracture, the patient will continue physical therapy at the nursing facility. The patient was advised to call  immediately if she has any concerning symptoms in the interval. The patient voices understanding of current disease status and treatment options and is in agreement with the current care plan.  All questions were answered. The patient knows to call the clinic with any problems, questions or concerns. We can certainly see the patient much sooner if necessary.  Disclaimer: This note was dictated with voice recognition software. Similar sounding words can inadvertently be transcribed and may not be corrected upon review.

## 2015-07-07 NOTE — Telephone Encounter (Signed)
Gave patient avs report and appointments for September and October  °

## 2015-07-07 NOTE — Patient Instructions (Signed)
Nivolumab injection What is this medicine? NIVOLUMAB (nye VOL ue mab) is used to treat certain types of melanoma and lung cancer. This medicine may be used for other purposes; ask your health care provider or pharmacist if you have questions. COMMON BRAND NAME(S): Opdivo What should I tell my health care provider before I take this medicine? They need to know if you have any of these conditions: -eye disease, vision problems -history of pancreatitis -immune system problems -inflammatory bowel disease -kidney disease -liver disease -lung disease -lupus -myasthenia gravis -multiple sclerosis -organ transplant -stomach or intestine problems -thyroid disease -tingling of the fingers or toes, or other nerve disorder -an unusual or allergic reaction to nivolumab, other medicines, foods, dyes, or preservatives -pregnant or trying to get pregnant -breast-feeding How should I use this medicine? This medicine is for infusion into a vein. It is given by a health care professional in a hospital or clinic setting. A special MedGuide will be given to you before each treatment. Be sure to read this information carefully each time. Talk to your pediatrician regarding the use of this medicine in children. Special care may be needed. Overdosage: If you think you've taken too much of this medicine contact a poison control center or emergency room at once. Overdosage: If you think you have taken too much of this medicine contact a poison control center or emergency room at once. NOTE: This medicine is only for you. Do not share this medicine with others. What if I miss a dose? It is important not to miss your dose. Call your doctor or health care professional if you are unable to keep an appointment. What may interact with this medicine? Interactions have not been studied. This list may not describe all possible interactions. Give your health care provider a list of all the medicines, herbs,  non-prescription drugs, or dietary supplements you use. Also tell them if you smoke, drink alcohol, or use illegal drugs. Some items may interact with your medicine. What should I watch for while using this medicine? Tell your doctor or healthcare professional if your symptoms do not start to get better or if they get worse. Your condition will be monitored carefully while you are receiving this medicine. You may need blood work done while you are taking this medicine. What side effects may I notice from receiving this medicine? Side effects that you should report to your doctor or health care professional as soon as possible: -allergic reactions like skin rash, itching or hives, swelling of the face, lips, or tongue -black, tarry stools -bloody or watery diarrhea -changes in vision -chills -cough -depressed mood -eye pain -feeling anxious -fever -general ill feeling or flu-like symptoms -hair loss -loss of appetite -low blood counts - this medicine may decrease the number of white blood cells, red blood cells and platelets. You may be at increased risk for infections and bleeding -pain, tingling, numbness in the hands or feet -redness, blistering, peeling or loosening of the skin, including inside the mouth -red pinpoint spots on skin -signs of decreased platelets or bleeding - bruising, pinpoint red spots on the skin, black, tarry stools, blood in the urine -signs of decreased red blood cells - unusually weak or tired, feeling faint or lightheaded, falls -signs of infection - fever or chills, cough, sore throat, pain or trouble passing urine -signs and symptoms of a dangerous change in heartbeat or heart rhythm like chest pain; dizziness; fast or irregular heartbeat; palpitations; feeling faint or lightheaded, falls; breathing problems -signs   and symptoms of high blood sugar such as dizziness; dry mouth; dry skin; fruity breath; nausea; stomach pain; increased hunger or thirst; increased  urination -signs and symptoms of kidney injury like trouble passing urine or change in the amount of urine -signs and symptoms of liver injury like dark yellow or brown urine; general ill feeling or flu-like symptoms; light-colored stools; loss of appetite; nausea; right upper belly pain; unusually weak or tired; yellowing of the eyes or skin -signs and symptoms of increased potassium like muscle weakness; chest pain; or fast, irregular heartbeat -signs and symptoms of low potassium like muscle cramps or muscle pain; chest pain; dizziness; feeling faint or lightheaded, falls; palpitations; breathing problems; or fast, irregular heartbeat -swelling of the ankles, feet, hands -weight gainSide effects that usually do not require medical attention (report to your doctor or health care professional if they continue or are bothersome): -constipation -general ill feeling or flu-like symptoms -hair loss -loss of appetite -nausea, vomiting This list may not describe all possible side effects. Call your doctor for medical advice about side effects. You may report side effects to FDA at 1-800-FDA-1088. Where should I keep my medicine? This drug is given in a hospital or clinic and will not be stored at home. NOTE: This sheet is a summary. It may not cover all possible information. If you have questions about this medicine, talk to your doctor, pharmacist, or health care provider.  2015, Elsevier/Gold Standard. (2014-01-06 13:18:19)  

## 2015-07-21 ENCOUNTER — Ambulatory Visit (HOSPITAL_BASED_OUTPATIENT_CLINIC_OR_DEPARTMENT_OTHER): Payer: Medicare Other

## 2015-07-21 ENCOUNTER — Other Ambulatory Visit (HOSPITAL_BASED_OUTPATIENT_CLINIC_OR_DEPARTMENT_OTHER): Payer: Medicare Other

## 2015-07-21 ENCOUNTER — Ambulatory Visit (HOSPITAL_BASED_OUTPATIENT_CLINIC_OR_DEPARTMENT_OTHER): Payer: Medicare Other | Admitting: Nurse Practitioner

## 2015-07-21 ENCOUNTER — Ambulatory Visit: Payer: Medicare Other

## 2015-07-21 ENCOUNTER — Other Ambulatory Visit: Payer: Self-pay | Admitting: *Deleted

## 2015-07-21 VITALS — BP 102/62 | HR 101 | Temp 98.4°F | Resp 18 | Ht 62.0 in | Wt 133.0 lb

## 2015-07-21 DIAGNOSIS — Z79899 Other long term (current) drug therapy: Secondary | ICD-10-CM | POA: Diagnosis not present

## 2015-07-21 DIAGNOSIS — Z5112 Encounter for antineoplastic immunotherapy: Secondary | ICD-10-CM | POA: Diagnosis not present

## 2015-07-21 DIAGNOSIS — C3490 Malignant neoplasm of unspecified part of unspecified bronchus or lung: Secondary | ICD-10-CM | POA: Diagnosis not present

## 2015-07-21 DIAGNOSIS — C342 Malignant neoplasm of middle lobe, bronchus or lung: Secondary | ICD-10-CM

## 2015-07-21 LAB — CBC WITH DIFFERENTIAL/PLATELET
BASO%: 1.4 % (ref 0.0–2.0)
Basophils Absolute: 0.1 10*3/uL (ref 0.0–0.1)
EOS%: 10 % — AB (ref 0.0–7.0)
Eosinophils Absolute: 0.6 10*3/uL — ABNORMAL HIGH (ref 0.0–0.5)
HCT: 39.1 % (ref 34.8–46.6)
HGB: 12.7 g/dL (ref 11.6–15.9)
LYMPH%: 11.2 % — AB (ref 14.0–49.7)
MCH: 30.6 pg (ref 25.1–34.0)
MCHC: 32.5 g/dL (ref 31.5–36.0)
MCV: 94.2 fL (ref 79.5–101.0)
MONO#: 0.8 10*3/uL (ref 0.1–0.9)
MONO%: 12.8 % (ref 0.0–14.0)
NEUT%: 64.6 % (ref 38.4–76.8)
NEUTROS ABS: 3.9 10*3/uL (ref 1.5–6.5)
Platelets: 228 10*3/uL (ref 145–400)
RBC: 4.15 10*6/uL (ref 3.70–5.45)
RDW: 15.1 % — ABNORMAL HIGH (ref 11.2–14.5)
WBC: 6.1 10*3/uL (ref 3.9–10.3)
lymph#: 0.7 10*3/uL — ABNORMAL LOW (ref 0.9–3.3)

## 2015-07-21 LAB — COMPREHENSIVE METABOLIC PANEL (CC13)
ALT: 11 U/L (ref 0–55)
ANION GAP: 7 meq/L (ref 3–11)
AST: 12 U/L (ref 5–34)
Albumin: 3.4 g/dL — ABNORMAL LOW (ref 3.5–5.0)
Alkaline Phosphatase: 114 U/L (ref 40–150)
BILIRUBIN TOTAL: 0.54 mg/dL (ref 0.20–1.20)
BUN: 12.4 mg/dL (ref 7.0–26.0)
CHLORIDE: 104 meq/L (ref 98–109)
CO2: 28 meq/L (ref 22–29)
CREATININE: 0.8 mg/dL (ref 0.6–1.1)
Calcium: 9 mg/dL (ref 8.4–10.4)
EGFR: 75 mL/min/{1.73_m2} — ABNORMAL LOW (ref >90)
GLUCOSE: 227 mg/dL — AB (ref 70–140)
Potassium: 4.4 mEq/L (ref 3.5–5.1)
SODIUM: 140 meq/L (ref 136–145)
TOTAL PROTEIN: 5.5 g/dL — AB (ref 6.4–8.3)

## 2015-07-21 LAB — TECHNOLOGIST REVIEW: Technologist Review: 1

## 2015-07-21 LAB — TSH CHCC: TSH: 4.463 m[IU]/L — AB (ref 0.308–3.960)

## 2015-07-21 MED ORDER — SODIUM CHLORIDE 0.9 % IV SOLN
3.0000 mg/kg | Freq: Once | INTRAVENOUS | Status: AC
  Administered 2015-07-21: 180 mg via INTRAVENOUS
  Filled 2015-07-21: qty 14

## 2015-07-21 MED ORDER — SODIUM CHLORIDE 0.9 % IV SOLN
Freq: Once | INTRAVENOUS | Status: AC
  Administered 2015-07-21: 16:00:00 via INTRAVENOUS

## 2015-07-21 NOTE — Patient Instructions (Signed)
[facility] [address] Discharge Instructions for Patients Receiving Chemotherapy  Today you received the following chemotherapy agents :  nivolamab To help prevent nausea and vomiting after your treatment, we encourage you to take your nausea medication. If you develop nausea and vomiting that is not controlled by your nausea medication, call the clinic.   BELOW ARE SYMPTOMS THAT SHOULD BE REPORTED IMMEDIATELY:  *FEVER GREATER THAN 100.5 F  *CHILLS WITH OR WITHOUT FEVER  NAUSEA AND VOMITING THAT IS NOT CONTROLLED WITH YOUR NAUSEA MEDICATION  *UNUSUAL SHORTNESS OF BREATH  *UNUSUAL BRUISING OR BLEEDING  TENDERNESS IN MOUTH AND THROAT WITH OR WITHOUT PRESENCE OF ULCERS  *URINARY PROBLEMS  *BOWEL PROBLEMS  UNUSUAL RASH Items with * indicate a potential emergency and should be followed up as soon as possible.  Feel free to call the clinic you have any questions or concerns. The clinic phone number is [phone].  Please show the [facility] at check-in to the Emergency Department and triage nurse.

## 2015-07-21 NOTE — Progress Notes (Signed)
  [facility] OFFICE PROGRESS NOTE   DIAGNOSIS: Squamous cell lung cancer  Primary site: Lung (Right)  Staging method: AJCC 7th Edition  Clinical: Stage IIIB (T3, N3, M0)  Summary: Stage IIIB (T3, N3, M0)   PRIOR THERAPY:  1) Concurrent chemoradiation with weekly chemotherapy in the form of carboplatin for AUC of 2 and paclitaxel 45 mg/m2. Status post 7 week of treatment. Last dose was given 01/27/2014 no significant response in her disease.  2) Consolidation chemotherapy with carboplatin for AUC of 5 and paclitaxel 175 mg/M2 every 3 weeks with Neulasta support. First dose on 04/15/2014. Status post 3 cycles. Carboplatin was discontinued starting cycle #2 secondary to hypersensitivity reaction.  CURRENT THERAPY: Immunotherapy with Opdivo (Nivolumab) '3mg'$ /kg given every 2 weeks. Status post 26 cycles.   INTERVAL HISTORY:   [name] returns as scheduled. She completed cycle 26 nivolumab 07/07/2015. She denies nausea/vomiting. No mouth sores. No significant diarrhea. For the past few days she has had one loose stool a day. No shortness of breath. She has a stable cough. No fever. She reports stable pain related to neuropathy from previous chemotherapy. She is currently residing at a nursing facility and receiving physical therapy.  Objective:  Vital signs in last 24 hours:  Blood pressure 102/62, pulse 101, temperature 98.4 F (36.9 C), temperature source Oral, resp. rate 18, height '5\' 2"'$  (1.575 m), weight 133 lb (60.328 kg), SpO2 98 %.    HEENT: No thrush or ulcers. Resp: Lungs clear bilaterally. Cardio: Regular rate and rhythm. GI: Abdomen soft and nontender. No hepatomegaly. Vascular: No leg edema. She is wearing a boot/brace on the right leg.  Lab Results:  Lab Results  Component Value Date   WBC 4.9 07/01/2015   HGB 11.5* 07/01/2015   HCT 35.2* 07/01/2015   MCV 95.9 07/01/2015   PLT 140* 07/01/2015   NEUTROABS 2.9 06/23/2015    Imaging:  No  results found.  Medications: I have reviewed the patient's current medications.  Assessment/Plan: 1. Metastatic non-small cell lung cancer currently undergoing treatment with immunotherapy with Nivolumab status post 26 cycles.   Disposition: Megan [name] appears stable. She has completed 26 cycles of nivolumab. Plan to proceed with cycle 27 today as scheduled pending CBC and chemistry results. She will return for a follow-up visit in 2 weeks. She will contact the office in the interim with any problems.    Ned Card ANP/GNP-BC   07/21/2015  3:03 PM

## 2015-08-01 ENCOUNTER — Emergency Department (HOSPITAL_COMMUNITY): Payer: Medicare Other

## 2015-08-01 ENCOUNTER — Encounter (HOSPITAL_COMMUNITY): Payer: Self-pay | Admitting: Emergency Medicine

## 2015-08-01 ENCOUNTER — Emergency Department (HOSPITAL_COMMUNITY)
Admission: EM | Admit: 2015-08-01 | Discharge: 2015-08-02 | Disposition: A | Discharge status: Home / Self Care | Payer: Medicare Other | Attending: Emergency Medicine | Admitting: Emergency Medicine

## 2015-08-01 DIAGNOSIS — Z79899 Other long term (current) drug therapy: Secondary | ICD-10-CM | POA: Diagnosis not present

## 2015-08-01 DIAGNOSIS — S92334A Nondisplaced fracture of third metatarsal bone, right foot, initial encounter for closed fracture: Secondary | ICD-10-CM | POA: Insufficient documentation

## 2015-08-01 DIAGNOSIS — Z8719 Personal history of other diseases of the digestive system: Secondary | ICD-10-CM | POA: Insufficient documentation

## 2015-08-01 DIAGNOSIS — Y92003 Bedroom of unspecified non-institutional (private) residence as the place of occurrence of the external cause: Secondary | ICD-10-CM | POA: Diagnosis not present

## 2015-08-01 DIAGNOSIS — Z7951 Long term (current) use of inhaled steroids: Secondary | ICD-10-CM | POA: Diagnosis not present

## 2015-08-01 DIAGNOSIS — S92324A Nondisplaced fracture of second metatarsal bone, right foot, initial encounter for closed fracture: Secondary | ICD-10-CM | POA: Insufficient documentation

## 2015-08-01 DIAGNOSIS — Z794 Long term (current) use of insulin: Secondary | ICD-10-CM | POA: Diagnosis not present

## 2015-08-01 DIAGNOSIS — G629 Polyneuropathy, unspecified: Secondary | ICD-10-CM | POA: Diagnosis not present

## 2015-08-01 DIAGNOSIS — Y998 Other external cause status: Secondary | ICD-10-CM | POA: Insufficient documentation

## 2015-08-01 DIAGNOSIS — M069 Rheumatoid arthritis, unspecified: Secondary | ICD-10-CM | POA: Insufficient documentation

## 2015-08-01 DIAGNOSIS — W1839XA Other fall on same level, initial encounter: Secondary | ICD-10-CM | POA: Insufficient documentation

## 2015-08-01 DIAGNOSIS — Z87891 Personal history of nicotine dependence: Secondary | ICD-10-CM | POA: Insufficient documentation

## 2015-08-01 DIAGNOSIS — S99929A Unspecified injury of unspecified foot, initial encounter: Secondary | ICD-10-CM

## 2015-08-01 DIAGNOSIS — E119 Type 2 diabetes mellitus without complications: Secondary | ICD-10-CM | POA: Insufficient documentation

## 2015-08-01 DIAGNOSIS — S92314A Nondisplaced fracture of first metatarsal bone, right foot, initial encounter for closed fracture: Secondary | ICD-10-CM | POA: Insufficient documentation

## 2015-08-01 DIAGNOSIS — Z85118 Personal history of other malignant neoplasm of bronchus and lung: Secondary | ICD-10-CM | POA: Diagnosis not present

## 2015-08-01 DIAGNOSIS — S92344A Nondisplaced fracture of fourth metatarsal bone, right foot, initial encounter for closed fracture: Secondary | ICD-10-CM | POA: Diagnosis not present

## 2015-08-01 DIAGNOSIS — I10 Essential (primary) hypertension: Secondary | ICD-10-CM | POA: Insufficient documentation

## 2015-08-01 DIAGNOSIS — Y9301 Activity, walking, marching and hiking: Secondary | ICD-10-CM | POA: Diagnosis not present

## 2015-08-01 DIAGNOSIS — S92301A Fracture of unspecified metatarsal bone(s), right foot, initial encounter for closed fracture: Secondary | ICD-10-CM

## 2015-08-01 DIAGNOSIS — S99921A Unspecified injury of right foot, initial encounter: Secondary | ICD-10-CM | POA: Diagnosis present

## 2015-08-01 LAB — CBC WITH DIFFERENTIAL/PLATELET
BASOS ABS: 0.1 10*3/uL (ref 0.0–0.1)
Basophils Relative: 1 %
Eosinophils Absolute: 0.8 10*3/uL — ABNORMAL HIGH (ref 0.0–0.7)
Eosinophils Relative: 14 %
HEMATOCRIT: 35 % — AB (ref 36.0–46.0)
Hemoglobin: 11.2 g/dL — ABNORMAL LOW (ref 12.0–15.0)
LYMPHS ABS: 0.8 10*3/uL (ref 0.7–4.0)
LYMPHS PCT: 15 %
MCH: 29.9 pg (ref 26.0–34.0)
MCHC: 32 g/dL (ref 30.0–36.0)
MCV: 93.6 fL (ref 78.0–100.0)
MONO ABS: 0.7 10*3/uL (ref 0.1–1.0)
MONOS PCT: 13 %
NEUTROS ABS: 3.3 10*3/uL (ref 1.7–7.7)
Neutrophils Relative %: 57 %
Platelets: 178 10*3/uL (ref 150–400)
RBC: 3.74 MIL/uL — ABNORMAL LOW (ref 3.87–5.11)
RDW: 14.2 % (ref 11.5–15.5)
WBC: 5.6 10*3/uL (ref 4.0–10.5)

## 2015-08-01 LAB — BASIC METABOLIC PANEL
ANION GAP: 6 (ref 5–15)
BUN: 15 mg/dL (ref 6–20)
CALCIUM: 8.7 mg/dL — AB (ref 8.9–10.3)
CO2: 30 mmol/L (ref 22–32)
Chloride: 98 mmol/L — ABNORMAL LOW (ref 101–111)
Creatinine, Ser: 0.7 mg/dL (ref 0.44–1.00)
GFR calc Af Amer: >60 mL/min (ref >60)
GFR calc non Af Amer: >60 mL/min (ref >60)
GLUCOSE: 264 mg/dL — AB (ref 65–99)
Potassium: 3.7 mmol/L (ref 3.5–5.1)
Sodium: 134 mmol/L — ABNORMAL LOW (ref 135–145)

## 2015-08-01 LAB — CBG MONITORING, ED: Glucose-Capillary: 252 mg/dL — ABNORMAL HIGH (ref 65–99)

## 2015-08-01 MED ORDER — LOPERAMIDE HCL 2 MG PO CAPS
2.0000 mg | ORAL_CAPSULE | Freq: Once | ORAL | Status: AC
  Administered 2015-08-01: 2 mg via ORAL
  Filled 2015-08-01: qty 1

## 2015-08-01 MED ORDER — HYDROCODONE-ACETAMINOPHEN 5-325 MG PO TABS: 1.0000 | ORAL_TABLET | Freq: Four times a day (QID) | ORAL | Status: DC | PRN

## 2015-08-01 MED ORDER — INSULIN DETEMIR 100 UNIT/ML ~~LOC~~ SOLN
28.0000 [IU] | Freq: Once | SUBCUTANEOUS | Status: AC
  Administered 2015-08-01: 28 [IU] via SUBCUTANEOUS
  Filled 2015-08-01: qty 0.28

## 2015-08-01 MED ORDER — INSULIN ASPART 100 UNIT/ML ~~LOC~~ SOLN
6.0000 [IU] | Freq: Once | SUBCUTANEOUS | Status: AC
  Administered 2015-08-01: 6 [IU] via SUBCUTANEOUS
  Filled 2015-08-01: qty 1

## 2015-08-01 MED ORDER — HYDROCODONE-ACETAMINOPHEN 5-325 MG PO TABS
1.0000 | ORAL_TABLET | Freq: Once | ORAL | Status: AC
  Administered 2015-08-01: 1 via ORAL
  Filled 2015-08-01: qty 1

## 2015-08-01 NOTE — ED Notes (Signed)
Pt arrived via EMS with report of rt ankle pain s/p to sudden onset of weakness in rt leg and falling. Pt denies hitting head, LOC, visual disturbances, and headaches. (+)PMS, CRT brisk, no deformity/discoloration, noted slight swelling, and LROM.

## 2015-08-01 NOTE — ED Notes (Signed)
Pt requesting to speak with provider. Barrett, PA notified.

## 2015-08-01 NOTE — Discharge Instructions (Signed)
- Call Dr. Sharol Given on Monday morning to schedule a follow up appointment - Remain non-weightbearing until follow up with Dr. Sharol Given   Metatarsal Fracture, Undisplaced A metatarsal fracture is a break in the bone(s) of the foot. These are the bones of the foot that connect your toes to the bones of the ankle. DIAGNOSIS  The diagnoses of these fractures are usually made with X-rays. If there are problems in the forefoot and x-rays are normal a later bone scan will usually make the diagnosis.  TREATMENT AND HOME CARE INSTRUCTIONS  Treatment may or may not include a cast or walking shoe. When casts are needed the use is usually for short periods of time so as not to slow down healing with muscle wasting (atrophy).  Activities should be stopped until further advised by your caregiver.  Wear shoes with adequate shock absorbing capabilities and stiff soles.  Alternative exercise may be undertaken while waiting for healing. These may include bicycling and swimming, or as your caregiver suggests.  It is important to keep all follow-up visits or specialty referrals. The failure to keep these appointments could result in improper bone healing and chronic pain or disability.  Warning: Do not drive a car or operate a motor vehicle until your caregiver specifically tells you it is safe to do so. IF YOU DO NOT HAVE A CAST OR SPLINT:  You may walk on your injured foot as tolerated or advised.  Do not put any weight on your injured foot for as long as directed by your caregiver. Slowly increase the amount of time you walk on the foot as the pain allows or as advised.  Use crutches until you can bear weight without pain. A gradual increase in weight bearing may help.  Apply ice to the injury for 15-20 minutes each hour while awake for the first 2 days. Put the ice in a plastic bag and place a towel between the bag of ice and your skin.  Only take over-the-counter or prescription medicines for pain,  discomfort, or fever as directed by your caregiver. SEEK IMMEDIATE MEDICAL CARE IF:   Your cast gets damaged or breaks.  You have continued severe pain or more swelling than you did before the cast was put on, or the pain is not controlled with medications.  Your skin or nails below the injury turn blue or grey, or feel cold or numb.  There is a bad smell, or new stains or pus-like (purulent) drainage coming from the cast. MAKE SURE YOU:   Understand these instructions.  Will watch your condition.  Will get help right away if you are not doing well or get worse. Document Released: 07/09/2002 Document Revised: 01/09/2012 Document Reviewed: 05/30/2008 [facility] Patient Information 2015 [place], [place]. This information is not intended to replace advice given to you by your health care provider. Make sure you discuss any questions you have with your health care provider.  Cast or Splint Care Casts and splints support injured limbs and keep bones from moving while they heal. It is important to care for your cast or splint at home.  HOME CARE INSTRUCTIONS  Keep the cast or splint uncovered during the drying period. It can take 24 to 48 hours to dry if it is made of plaster. A fiberglass cast will dry in less than 1 hour.  Do not rest the cast on anything harder than a pillow for the first 24 hours.  Do not put weight on your injured limb or apply  pressure to the cast until your health care provider gives you permission.  Keep the cast or splint dry. Wet casts or splints can lose their shape and may not support the limb as well. A wet cast that has lost its shape can also create harmful pressure on your skin when it dries. Also, wet skin can become infected.  Cover the cast or splint with a plastic bag when bathing or when out in the rain or snow. If the cast is on the trunk of the body, take sponge baths until the cast is removed.  If your cast does become wet, dry it with a towel or a  blow dryer on the cool setting only.  Keep your cast or splint clean. Soiled casts may be wiped with a moistened cloth.  Do not place any hard or soft foreign objects under your cast or splint, such as cotton, toilet paper, lotion, or powder.  Do not try to scratch the skin under the cast with any object. The object could get stuck inside the cast. Also, scratching could lead to an infection. If itching is a problem, use a blow dryer on a cool setting to relieve discomfort.  Do not trim or cut your cast or remove padding from inside of it.  Exercise all joints next to the injury that are not immobilized by the cast or splint. For example, if you have a long leg cast, exercise the hip joint and toes. If you have an arm cast or splint, exercise the shoulder, elbow, thumb, and fingers.  Elevate your injured arm or leg on 1 or 2 pillows for the first 1 to 3 days to decrease swelling and pain.It is best if you can comfortably elevate your cast so it is higher than your heart. SEEK MEDICAL CARE IF:   Your cast or splint cracks.  Your cast or splint is too tight or too loose.  You have unbearable itching inside the cast.  Your cast becomes wet or develops a soft spot or area.  You have a bad smell coming from inside your cast.  You get an object stuck under your cast.  Your skin around the cast becomes red or raw.  You have new pain or worsening pain after the cast has been applied. SEEK IMMEDIATE MEDICAL CARE IF:   You have fluid leaking through the cast.  You are unable to move your fingers or toes.  You have discolored (blue or white), cool, painful, or very swollen fingers or toes beyond the cast.  You have tingling or numbness around the injured area.  You have severe pain or pressure under the cast.  You have any difficulty with your breathing or have shortness of breath.  You have chest pain. Document Released: 10/14/2000 Document Revised: 08/07/2013 Document Reviewed:  04/25/2013 [facility] Patient Information 2015 [place], [place]. This information is not intended to replace advice given to you by your health care provider. Make sure you discuss any questions you have with your health care provider.

## 2015-08-01 NOTE — ED Notes (Signed)
Bed: WA06 Expected date:  Expected time:  Means of arrival:  Comments: EMS - ankle injury

## 2015-08-01 NOTE — ED Provider Notes (Signed)
CSN: [id number]     Arrival date & time 08/01/15  1637 History   First MD Initiated Contact with Patient 08/01/15 1651     Chief Complaint  Patient presents with  . Ankle Pain   HPI  Ms. Brocious is a 74 year old female with PMHx of HTN and lung cancer presenting after a fall. Pt states she was walking around her bedroom when her right leg "just gave way". She fell to the floor and reports that she rolled her right ankle underneath her. She reports immediate severe pain over the dorsum and lateral aspect of her right food. She endorses swelling of the foot and pain with ambulation. Denies numbness, tingling or loss of sensation in the foot. Denies head injury or other injuries sustained in fall. She denies fever, chills, chest pain, shortness of breath, abdominal pain, nausea, vomiting, lightheadedness, dizziness, or syncope. Pt reports recent history of right distal fibula fracture requiring ORIF on 9/3 by Dr. Sharol Given. She reports no complications and is healing well.   Past Medical History  Diagnosis Date  . Hypertension   . Rheumatoid arthritis ([place])   . GERD (gastroesophageal reflux disease)     no meds for  . Hx of radiation therapy 12/16/13-01/30/14    lung 66Gy  . Neuropathy ([place])   . Shortness of breath dyspnea     with exertion  . History of hiatal hernia   . Diabetes mellitus     insulin  . Cancer [facility]) dx'd 09/2013    Lung ca   Past Surgical History  Procedure Laterality Date  . Cholecystectomy    . Abdominal hysterectomy    . Dilation and curettage of uterus    . Abdominal surgery    . Tonsillectomy    . Esophagogastroduodenoscopy (egd) with propofol N/A 10/09/2014    Procedure: ESOPHAGOGASTRODUODENOSCOPY (EGD) WITH PROPOFOL;  Surgeon: Inda Castle, MD;  Location: [facility];  Service: Endoscopy;  Laterality: N/A;  . Savory dilation N/A 10/09/2014    Procedure: SAVORY DILATION;  Surgeon: Inda Castle, MD;  Location: [place];  Service: Endoscopy;  Laterality:  N/A;  . Esophagogastroduodenoscopy N/A 12/12/2014    Procedure: ESOPHAGOGASTRODUODENOSCOPY (EGD);  Surgeon: Inda Castle, MD;  Location: [facility];  Service: Endoscopy;  Laterality: N/A;  with dilation  . Balloon dilation N/A 12/12/2014    Procedure: BALLOON DILATION;  Surgeon: Inda Castle, MD;  Location: [facility];  Service: Endoscopy;  Laterality: N/A;  . Orif ankle fracture Right 07/04/2015    Procedure: OPEN REDUCTION INTERNAL FIXATION (ORIF) ANKLE FRACTURE;  Surgeon: Newt Minion, MD;  Location: [place];  Service: Orthopedics;  Laterality: Right;  OPEN REDUCTION, INTERNAL FIXATION OF RIGHT ANKLE FRACTURE.    Family History  Problem Relation Age of Onset  . Hypertension Other   . Stroke Other   . Heart attack Other   . Hemachromatosis Other   . Rheum arthritis      both sets of grandparents and father  . Other Mother   . Other Father     failure to thrive   Social History  Substance Use Topics  . Smoking status: Former Smoker -- 3.00 packs/day for 45 years    Types: Cigarettes    Quit date: 12/05/1997  . Smokeless tobacco: Never Used  . Alcohol Use: No   OB History    No data available     Review of Systems  Constitutional: Negative for fever and chills.  Respiratory: Negative for shortness of  breath.   Cardiovascular: Negative for chest pain.  Gastrointestinal: Negative for nausea, vomiting and abdominal pain.  Musculoskeletal: Positive for arthralgias and gait problem.  Neurological: Negative for dizziness, syncope, light-headedness and headaches.      Allergies  Remicade; Carboplatin; Shellfish-derived products; and Hydromorphone  Home Medications   Prior to Admission medications   Medication Sig Start Date End Date Taking? Authorizing Provider  DULoxetine (CYMBALTA) 60 MG capsule Take 60 mg by mouth daily.  06/18/15  Yes Historical Provider, MD  fluticasone (CUTIVATE) 0.05 % cream Apply 1 application topically 2 (two) times daily as needed (rash).     Yes Historical Provider, MD  Fluticasone-Salmeterol (ADVAIR DISKUS) 100-50 MCG/DOSE AEPB Inhale 1 puff into the lungs 2 (two) times daily. 08/27/14  Yes Brand Males, MD  Gabapentin, Once-Daily, (GRALISE) 600 MG TABS Take 1,800 mg by mouth daily. 05/07/15  Yes Melvenia Beam, MD  insulin detemir (LEVEMIR) 100 UNIT/ML injection Inject 28 Units into the skin at bedtime.   Yes Historical Provider, MD  insulin lispro (HUMALOG) 100 UNIT/ML injection Inject 6 Units into the skin 4 (four) times daily - after meals and at bedtime.   Yes Historical Provider, MD  loperamide (IMODIUM A-D) 2 MG tablet Take 2 mg by mouth 4 (four) times daily as needed for diarrhea or loose stools.   Yes Historical Provider, MD  losartan-hydrochlorothiazide (HYZAAR) 100-25 MG tablet Take 1/2 tablet daily 07/12/15  Yes Historical Provider, MD  potassium chloride SA (K-DUR,KLOR-CON) 20 MEQ tablet Take 40 mEq by mouth daily.   Yes Historical Provider, MD  sulfaSALAzine (AZULFIDINE) 500 MG EC tablet Take 1,000 mg by mouth 2 (two) times daily.   Yes Historical Provider, MD  B-D ULTRAFINE III SHORT PEN 31G X 8 MM MISC  10/28/14   Historical Provider, MD  HYDROcodone-acetaminophen (NORCO/VICODIN) 5-325 MG tablet Take 1-2 tablets by mouth every 6 (six) hours as needed. 08/01/15   Riese Hellard, PA-C  ibuprofen (ADVIL,MOTRIN) 600 MG tablet Take 1 tablet (600 mg total) by mouth every 6 (six) hours as needed. Take with food. Patient taking differently: Take 600 mg by mouth every 6 (six) hours as needed (pain). Take with food. 10/06/14   Curt Bears, MD  [facility] DELICA LANCETS FINE [place]  02/04/15   Historical Provider, MD  pantoprazole (PROTONIX) 40 MG tablet TAKE 1 TABLET BY MOUTH DAILY Patient not taking: Reported on 08/01/2015 04/28/15   Brand Males, MD   BP 99/61 mmHg  Pulse 93  Temp(Src) 97.7 F (36.5 C) (Oral)  Resp 18  SpO2 93% Physical Exam  Constitutional: She appears well-developed and well-nourished. No distress.   HENT:  Head: Normocephalic and atraumatic.  Eyes: Conjunctivae and EOM are normal. Pupils are equal, round, and reactive to light. Right eye exhibits no discharge. Left eye exhibits no discharge. No scleral icterus.  Neck: Normal range of motion.  Cardiovascular: Normal rate and regular rhythm.   Pedal pulses palpable b/l. Cap refill < 3  Pulmonary/Chest: Effort normal and breath sounds normal. No respiratory distress. She has no wheezes. She has no rales.  Musculoskeletal: Normal range of motion.  Left mid-foot swollen and tender over dorsum. FROM of toes and ankle. No ecchymosis noted over dorsum or plantar aspect of foot. No visible deformity, laceration or abrasion.   Neurological: She is alert. No cranial nerve deficit. Coordination normal.  Cranial nerves 3-12 intact. 5/5 motor strength of BLE. Sensation to light touch intact.   Skin: Skin is warm and dry.  Psychiatric: She has  a normal mood and affect. Her behavior is normal.  Nursing note and vitals reviewed.   ED Course  Procedures (including critical care time) Labs Review Labs Reviewed  CBC WITH DIFFERENTIAL/PLATELET - Abnormal; Notable for the following:    RBC 3.74 (*)    Hemoglobin 11.2 (*)    HCT 35.0 (*)    Eosinophils Absolute 0.8 (*)    All other components within normal limits  BASIC METABOLIC PANEL - Abnormal; Notable for the following:    Sodium 134 (*)    Chloride 98 (*)    Glucose, Bld 264 (*)    Calcium 8.7 (*)    All other components within normal limits  CBG MONITORING, ED - Abnormal; Notable for the following:    Glucose-Capillary 252 (*)    All other components within normal limits  URINALYSIS, ROUTINE W REFLEX MICROSCOPIC (NOT AT [facility])    Imaging Review Dg Ankle Complete Right  08/01/2015   CLINICAL DATA:  Right ankle and foot pain with onset of weakness after falling yesterday . ORIF 07/04/2015.  EXAM: RIGHT ANKLE - COMPLETE 3+ VIEW  COMPARISON:  07/01/2015  FINDINGS: Interval plate and screw  fixation of the distal fibula. The medial malleolar tip fracture is again identified. There is also a posterior tibial fracture which is healing. No acute hardware complication. Mild bimalleolar soft tissue swelling is nonspecific, given prior surgery. No new fracture identified. Base of fifth metatarsal and talar dome intact. Small calcaneal spur.  IMPRESSION: Postsurgical in nonacute posttraumatic changes. No hardware complication or convincing evidence of acute osseous finding.   Electronically Signed   By: Abigail Miyamoto M.D.   On: 08/01/2015 18:32   Ct Foot Right Wo Contrast  08/01/2015   CLINICAL DATA:  Evaluate metatarsals of the right foot. Initial encounter.  EXAM: CT OF THE RIGHT FOOT WITHOUT CONTRAST  TECHNIQUE: Multidetector CT imaging of the right foot was performed according to the standard protocol. Multiplanar CT image reconstructions were also generated.  COMPARISON:  Radiography from earlier today  FINDINGS: Limited by osteopenia.  Base fractures of the first through fourth metatarsals with intertarsal and first tarsal metatarsal extension. These fractures are nondisplaced. Associated fracture involving the lateral and distal aspect of the medial cuneiform consistent with Lisfranc ligament avulsion. No offset at the Lisfranc joint.  Irregularity at the first metatarsal head is attributed to osteoarthritic spurring. No definite fracture at this level.  There is a coronal oblique fracture through the distal fibula without displacement, partly visualized. Previous plate and screw fixation of the distal fibula. Coronal fracture across the posterior malleolus without displacement. Nondisplaced medial malleolus tip avulsion fracture.  No gross displacement or rupture of tendons at the ankle.  IMPRESSION: Lisfranc ligament avulsion fracture of the medial cuneiform.  Nondisplaced first through fourth metatarsal base fractures.  Nondisplaced posterior and medial malleolus fractures. Nondisplaced distal  fibular fracture, partly visualized.   Electronically Signed   By: Monte Fantasia M.D.   On: 08/01/2015 21:47   Dg Foot Complete Right  08/01/2015   CLINICAL DATA:  Fall with right foot injury  EXAM: RIGHT FOOT COMPLETE - 3+ VIEW  COMPARISON:  None.  FINDINGS: There is a comminuted nondisplaced intra-articular fracture of the proximal right first metatarsal. There is a comminuted nondisplaced intra-articular fracture of the medial proximal right second metatarsal. There are comminuted nondisplaced intra-articular fractures of the proximal right third and fourth metatarsals. There is a nondisplaced fracture of the tip of the medial malleolus. No additional fracture is seen  in the right foot. No dislocation is seen in the right foot. No suspicious focal osseous lesion. There is a moderate hallux valgus deformity with associated first metatarsal phalangeal joint osteoarthritis and medial soft tissue bunion. Small plantar right calcaneal spur. Partially visualized is a surgical plate with interlocking screws in the distal right fibula. There is prominent soft tissue swelling surrounding each of the fracture sites in the right foot.  IMPRESSION: 1. Nondisplaced comminuted intra-articular fractures of the proximal first, second, third and fourth metatarsals in the right foot. The proximal first and second metatarsal fractures involve the Lisfranc joint. No subluxation in the right foot. 2. Nondisplaced right medial malleolar tip fracture.   Electronically Signed   By: Ilona Sorrel M.D.   On: 08/01/2015 18:30   I have personally reviewed and evaluated these images and lab results as part of my medical decision-making.   EKG Interpretation None      MDM   Final diagnoses:  Foot injury  Metatarsal fracture, right, closed, initial encounter   Pt presenting after mechanical fall at home with right ankle injury as noted above. VSS. Pt nontoxic. Right midfoot swollen and tender to palpation. No visible  deformity, ecchymosis or abrasions. FROM of toes and ankle. Foot neurovascularly intact. No evidence of compartment syndrome. Foot dg shows nondisplaced comminuted intra-articular fractures of the proximal 1-4 metatarsals with lisfranc joint involvement of 1-2 metatarsals. Consulted to [place] [facility] on call doctor who recommended foot CT, splint and follow up with Dr. Sharol Given early next week. Pt agrees with this plan. She has a knee scooter at home from previous right leg injury that she will use to get around. Her boyfriend is in ED and will drive her home. Will give vicodin for pain control. Return precautions given in discharge paperwork and discussed with pt at bedside. Pt stable for discharge   Josephina Gip, PA-C 08/02/15 0106  Virgel Manifold, MD 08/06/15 1433

## 2015-08-01 NOTE — ED Notes (Signed)
Awake. Verbally responsive. A/O x4. Resp even and unlabored. No audible adventitious breath sounds noted. ABC's intact. Family at bedside. 

## 2015-08-01 NOTE — ED Notes (Signed)
Pt unable to provided urine specimen at this time. Pt drinking water.

## 2015-08-01 NOTE — ED Notes (Signed)
Nurse notified blood sugar 252

## 2015-08-01 NOTE — ED Notes (Signed)
Patient has had numerous episodes of diarrhea. Urine contaminated.

## 2015-08-04 ENCOUNTER — Other Ambulatory Visit (HOSPITAL_BASED_OUTPATIENT_CLINIC_OR_DEPARTMENT_OTHER): Payer: Medicare Other

## 2015-08-04 ENCOUNTER — Telehealth: Payer: Self-pay | Admitting: Internal Medicine

## 2015-08-04 ENCOUNTER — Encounter: Payer: Self-pay | Admitting: Internal Medicine

## 2015-08-04 ENCOUNTER — Ambulatory Visit (HOSPITAL_BASED_OUTPATIENT_CLINIC_OR_DEPARTMENT_OTHER): Payer: Medicare Other | Admitting: Internal Medicine

## 2015-08-04 ENCOUNTER — Other Ambulatory Visit (HOSPITAL_COMMUNITY)
Admission: RE | Admit: 2015-08-04 | Discharge: 2015-08-04 | Disposition: A | Discharge status: Home / Self Care | Payer: Medicare Other | Source: Ambulatory Visit | Attending: Internal Medicine | Admitting: Internal Medicine

## 2015-08-04 ENCOUNTER — Ambulatory Visit (HOSPITAL_BASED_OUTPATIENT_CLINIC_OR_DEPARTMENT_OTHER): Payer: Medicare Other

## 2015-08-04 ENCOUNTER — Other Ambulatory Visit: Payer: Self-pay | Admitting: Medical Oncology

## 2015-08-04 ENCOUNTER — Telehealth: Payer: Self-pay | Admitting: *Deleted

## 2015-08-04 VITALS — BP 113/71 | HR 115 | Temp 98.3°F | Resp 18 | Ht 62.0 in | Wt 127.9 lb

## 2015-08-04 DIAGNOSIS — Z5112 Encounter for antineoplastic immunotherapy: Secondary | ICD-10-CM | POA: Diagnosis not present

## 2015-08-04 DIAGNOSIS — R197 Diarrhea, unspecified: Secondary | ICD-10-CM | POA: Diagnosis not present

## 2015-08-04 DIAGNOSIS — Z794 Long term (current) use of insulin: Secondary | ICD-10-CM | POA: Diagnosis not present

## 2015-08-04 DIAGNOSIS — Z7189 Other specified counseling: Secondary | ICD-10-CM | POA: Diagnosis not present

## 2015-08-04 DIAGNOSIS — C342 Malignant neoplasm of middle lobe, bronchus or lung: Secondary | ICD-10-CM | POA: Diagnosis not present

## 2015-08-04 DIAGNOSIS — C3490 Malignant neoplasm of unspecified part of unspecified bronchus or lung: Secondary | ICD-10-CM

## 2015-08-04 DIAGNOSIS — E1165 Type 2 diabetes mellitus with hyperglycemia: Secondary | ICD-10-CM

## 2015-08-04 LAB — CBC WITH DIFFERENTIAL/PLATELET
BASO%: 1.4 % (ref 0.0–2.0)
BASOS ABS: 0.1 10*3/uL (ref 0.0–0.1)
EOS ABS: 0.5 10*3/uL (ref 0.0–0.5)
EOS%: 6.8 % (ref 0.0–7.0)
HEMATOCRIT: 41.5 % (ref 34.8–46.6)
HEMOGLOBIN: 13.5 g/dL (ref 11.6–15.9)
LYMPH#: 0.8 10*3/uL — AB (ref 0.9–3.3)
LYMPH%: 11 % — ABNORMAL LOW (ref 14.0–49.7)
MCH: 30.8 pg (ref 25.1–34.0)
MCHC: 32.5 g/dL (ref 31.5–36.0)
MCV: 95 fL (ref 79.5–101.0)
MONO#: 0.8 10*3/uL (ref 0.1–0.9)
MONO%: 10 % (ref 0.0–14.0)
NEUT%: 70.8 % (ref 38.4–76.8)
NEUTROS ABS: 5.4 10*3/uL (ref 1.5–6.5)
Platelets: 262 10*3/uL (ref 145–400)
RBC: 4.37 10*6/uL (ref 3.70–5.45)
RDW: 15 % — AB (ref 11.2–14.5)
WBC: 7.6 10*3/uL (ref 3.9–10.3)

## 2015-08-04 LAB — TSH CHCC: TSH: 3.527 m[IU]/L (ref 0.308–3.960)

## 2015-08-04 LAB — CLOSTRIDIUM DIFFICILE BY PCR: Toxigenic C. Difficile by PCR: NEGATIVE

## 2015-08-04 LAB — COMPREHENSIVE METABOLIC PANEL (CC13)
ALBUMIN: 3.2 g/dL — AB (ref 3.5–5.0)
ALK PHOS: 117 U/L (ref 40–150)
ALT: 17 U/L (ref 0–55)
AST: 12 U/L (ref 5–34)
Anion Gap: 16 mEq/L — ABNORMAL HIGH (ref 3–11)
BILIRUBIN TOTAL: 0.63 mg/dL (ref 0.20–1.20)
BUN: 15.5 mg/dL (ref 7.0–26.0)
CALCIUM: 9.1 mg/dL (ref 8.4–10.4)
CO2: 16 mEq/L — ABNORMAL LOW (ref 22–29)
CREATININE: 1.1 mg/dL (ref 0.6–1.1)
Chloride: 99 mEq/L (ref 98–109)
EGFR: 50 mL/min/{1.73_m2} — ABNORMAL LOW (ref >90)
GLUCOSE: 542 mg/dL — AB (ref 70–140)
Potassium: 4.6 mEq/L (ref 3.5–5.1)
SODIUM: 132 meq/L — AB (ref 136–145)
TOTAL PROTEIN: 6 g/dL — AB (ref 6.4–8.3)

## 2015-08-04 LAB — C DIFFICILE QUICK SCREEN W PCR REFLEX
C Diff antigen: UNDETERMINED — AB
C Diff toxin: UNDETERMINED — AB

## 2015-08-04 LAB — TECHNOLOGIST REVIEW

## 2015-08-04 MED ORDER — INSULIN REGULAR HUMAN 100 UNIT/ML IJ SOLN
20.0000 [IU] | Freq: Once | INTRAMUSCULAR | Status: AC
  Administered 2015-08-04: 20 [IU] via SUBCUTANEOUS
  Filled 2015-08-04: qty 0.2

## 2015-08-04 NOTE — Telephone Encounter (Signed)
Pt confirmed labs/ov per 10/04 POF, gave pt AVS and Calendar.... KJ, sent msg to add chemo

## 2015-08-04 NOTE — Progress Notes (Signed)
[facility] [phone]   [phone]  OFFICE PROGRESS NOTE  [name], MD [address]  [place] [address]  DIAGNOSIS: Squamous cell lung cancer  Primary site: Lung (Right)  Staging method: AJCC 7th Edition  Clinical: Stage IIIB (T3, N3, M0)  Summary: Stage IIIB (T3, N3, M0)   PRIOR THERAPY:  1) Concurrent chemoradiation with weekly chemotherapy in the form of carboplatin for AUC of 2 and paclitaxel 45 mg/m2. Status post 7 week of treatment. Last dose was given 01/27/2014 no significant response in her disease.  2) Consolidation chemotherapy with carboplatin for AUC of 5 and paclitaxel 175 mg/M2 every 3 weeks with Neulasta support. First dose on 04/15/2014. Status post 3 cycles. Carboplatin was discontinued starting cycle #2 secondary to hypersensitivity reaction.  CURRENT THERAPY: Immunotherapy with Opdivo (Nivolumab) '3mg'$ /kg given every 2 weeks. Status post 27 cycles.  CHEMOTHERAPY INTENT: Control/Palliative  CURRENT # OF CHEMOTHERAPY CYCLES: 28 CURRENT ANTIEMETICS: Zofran, dexamethasone, Compazine  CURRENT SMOKING STATUS: Former smoker, quit 12/05/1997  ORAL CHEMOTHERAPY AND CONSENT: n/a  CURRENT BISPHOSPHONATES USE: None  PAIN MANAGEMENT: no cancer related pain, patient does have RA  NARCOTICS INDUCED CONSTIPATION: none  LIVING WILL AND CODE STATUS: ?  INTERVAL HISTORY: Megan [name] 74 y.o. female returns to the clinic today for follow-up visit. The patient is currently on immunotherapy with Nivolumab status post 27 cycles. She is tolerating her treatment well with no significant adverse effects but recently has few episodes of diarrhea on daily basis up to 6 times a day few days ago. She took some Imodium with mild improvement in her diarrhea. The patient also has uncontrolled hyperglycemia. She still undergoing rehabilitation after sustaining several fractures of the right lower extremity  recently. She is currently a resident of the [facility] skilled nursing facility. She denied having any significant weight loss or night sweats. The patient denied having any chest pain, shortness of breath, cough or hemoptysis. She has no significant nausea or vomiting, no fever or chills. She was supposed to start cycle #28 today.  MEDICAL HISTORY: Past Medical History  Diagnosis Date  . Hypertension   . Rheumatoid arthritis ([place])   . GERD (gastroesophageal reflux disease)     no meds for  . Hx of radiation therapy 12/16/13-01/30/14    lung 66Gy  . Neuropathy ([place])   . Shortness of breath dyspnea     with exertion  . History of hiatal hernia   . Diabetes mellitus     insulin  . Cancer ([facility]) dx'd 09/2013    Lung ca    ALLERGIES:  is allergic to remicade; carboplatin; shellfish-derived products; and hydromorphone.  MEDICATIONS:  Current Outpatient Prescriptions  Medication Sig Dispense Refill  . B-D ULTRAFINE III SHORT PEN 31G X 8 MM MISC   0  . DULoxetine (CYMBALTA) 60 MG capsule Take 60 mg by mouth daily.   11  . fluticasone (CUTIVATE) 0.05 % cream Apply 1 application topically 2 (two) times daily as needed (rash).     . Fluticasone-Salmeterol (ADVAIR DISKUS) 100-50 MCG/DOSE AEPB Inhale 1 puff into the lungs 2 (two) times daily. 60 each 5  . Gabapentin, Once-Daily, (GRALISE) 600 MG TABS Take 1,800 mg by mouth daily. 90 tablet 11  . HYDROcodone-acetaminophen (NORCO/VICODIN) 5-325 MG tablet Take 1-2 tablets by mouth every 6 (six) hours as needed. 10 tablet 0  . ibuprofen (ADVIL,MOTRIN) 600 MG tablet Take 1 tablet (600 mg total) by mouth every 6 (six) hours  as needed. Take with food. (Patient taking differently: Take 600 mg by mouth every 6 (six) hours as needed (pain). Take with food.) 30 tablet 0  . insulin detemir (LEVEMIR) 100 UNIT/ML injection Inject 28 Units into the skin at bedtime.    . insulin lispro (HUMALOG) 100 UNIT/ML injection Inject 6 Units into the skin 4 (four) times  daily - after meals and at bedtime.    [name] loperamide (IMODIUM A-D) 2 MG tablet Take 2 mg by mouth 4 (four) times daily as needed for diarrhea or loose stools.    [name] losartan-hydrochlorothiazide (HYZAAR) 100-25 MG tablet Take 1/2 tablet daily  2  . ONETOUCH DELICA LANCETS FINE MISC   0  . pantoprazole (PROTONIX) 40 MG tablet TAKE 1 TABLET BY MOUTH DAILY 30 tablet 0  . potassium chloride SA (K-DUR,KLOR-CON) 20 MEQ tablet Take 40 mEq by mouth daily.    . Probiotic Product (PROBIOTIC DAILY PO) Take 1 tablet by mouth daily.    [name] sulfaSALAzine (AZULFIDINE) 500 MG EC tablet Take 1,000 mg by mouth 2 (two) times daily.     No current facility-administered medications for this visit.   Facility-Administered Medications Ordered in Other Visits  Medication Dose Route Frequency Provider Last Rate Last Dose  . sodium chloride 0.9 % injection 10 mL  10 mL Intracatheter PRN Curt Bears, MD   10 mL at 12/30/13 1121    SURGICAL HISTORY:  Past Surgical History  Procedure Laterality Date  . Cholecystectomy    . Abdominal hysterectomy    . Dilation and curettage of uterus    . Abdominal surgery    . Tonsillectomy    . Esophagogastroduodenoscopy (egd) with propofol N/A 10/09/2014    Procedure: ESOPHAGOGASTRODUODENOSCOPY (EGD) WITH PROPOFOL;  Surgeon: Inda Castle, MD;  Location: [facility];  Service: Endoscopy;  Laterality: N/A;  . Savory dilation N/A 10/09/2014    Procedure: SAVORY DILATION;  Surgeon: Inda Castle, MD;  Location: [facility];  Service: Endoscopy;  Laterality: N/A;  . Esophagogastroduodenoscopy N/A 12/12/2014    Procedure: ESOPHAGOGASTRODUODENOSCOPY (EGD);  Surgeon: Inda Castle, MD;  Location: [facility];  Service: Endoscopy;  Laterality: N/A;  with dilation  . Balloon dilation N/A 12/12/2014    Procedure: BALLOON DILATION;  Surgeon: Inda Castle, MD;  Location: [facility];  Service: Endoscopy;  Laterality: N/A;  . Orif ankle fracture Right 07/04/2015    Procedure: OPEN  REDUCTION INTERNAL FIXATION (ORIF) ANKLE FRACTURE;  Surgeon: Newt Minion, MD;  Location: [facility];  Service: Orthopedics;  Laterality: Right;  OPEN REDUCTION, INTERNAL FIXATION OF RIGHT ANKLE FRACTURE.     REVIEW OF SYSTEMS:  Constitutional: positive for fatigue Eyes: negative Ears, nose, mouth, throat, and face: negative Respiratory: positive for dyspnea on exertion Cardiovascular: negative Gastrointestinal: positive for diarrhea Genitourinary:negative Integument/breast: negative Hematologic/lymphatic: negative Musculoskeletal:positive for bone pain Neurological: negative Behavioral/Psych: negative Endocrine: negative Allergic/Immunologic: negative   PHYSICAL EXAMINATION: General appearance: alert, cooperative, fatigued and no distress Head: Normocephalic, without obvious abnormality, atraumatic Neck: no adenopathy, no JVD, supple, symmetrical, trachea midline and thyroid not enlarged, symmetric, no tenderness/mass/nodules Lymph nodes: Cervical, supraclavicular, and axillary nodes normal. Resp: clear to auscultation bilaterally Back: symmetric, no curvature. ROM normal. No CVA tenderness. Cardio: regular rate and rhythm, S1, S2 normal, no murmur, click, rub or gallop GI: soft, non-tender; bowel sounds normal; no masses,  no organomegaly Extremities: extremities normal, atraumatic, no cyanosis or edema Neurologic: Alert and oriented X 3, normal strength and tone. Normal symmetric reflexes. Normal coordination and gait  ECOG PERFORMANCE STATUS: 1 - Symptomatic but completely ambulatory  Blood pressure 113/71, pulse 115, temperature 98.3 F (36.8 C), temperature source Oral, resp. rate 18, height '5\' 2"'$  (1.575 m), weight 127 lb 14.4 oz (58.015 kg), SpO2 97 %.  LABORATORY DATA: Lab Results  Component Value Date   WBC 7.6 08/04/2015   HGB 13.5 08/04/2015   HCT 41.5 08/04/2015   MCV 95.0 08/04/2015   PLT 262 08/04/2015      Chemistry      Component Value Date/Time   NA 132*  08/04/2015 0955   NA 134* 08/01/2015 2021   K 4.6 08/04/2015 0955   K 3.7 08/01/2015 2021   CL 98* 08/01/2015 2021   CO2 16* 08/04/2015 0955   CO2 30 08/01/2015 2021   BUN 15.5 08/04/2015 0955   BUN 15 08/01/2015 2021   CREATININE 1.1 08/04/2015 0955   CREATININE 0.70 08/01/2015 2021   GLU 299* 02/02/2015 1348      Component Value Date/Time   CALCIUM 9.1 08/04/2015 0955   CALCIUM 8.7* 08/01/2015 2021   ALKPHOS 117 08/04/2015 0955   ALKPHOS 87 07/04/2015 0649   AST 12 08/04/2015 0955   AST 17 07/04/2015 0649   ALT 17 08/04/2015 0955   ALT 12* 07/04/2015 0649   BILITOT 0.63 08/04/2015 0955   BILITOT 0.3 07/04/2015 0649       RADIOGRAPHIC STUDIES: Dg Ankle Complete Right  08/01/2015   CLINICAL DATA:  Right ankle and foot pain with onset of weakness after falling yesterday . ORIF 07/04/2015.  EXAM: RIGHT ANKLE - COMPLETE 3+ VIEW  COMPARISON:  07/01/2015  FINDINGS: Interval plate and screw fixation of the distal fibula. The medial malleolar tip fracture is again identified. There is also a posterior tibial fracture which is healing. No acute hardware complication. Mild bimalleolar soft tissue swelling is nonspecific, given prior surgery. No new fracture identified. Base of fifth metatarsal and talar dome intact. Small calcaneal spur.  IMPRESSION: Postsurgical in nonacute posttraumatic changes. No hardware complication or convincing evidence of acute osseous finding.   Electronically Signed   By: Abigail Miyamoto M.D.   On: 08/01/2015 18:32   Ct Foot Right Wo Contrast  08/01/2015   CLINICAL DATA:  Evaluate metatarsals of the right foot. Initial encounter.  EXAM: CT OF THE RIGHT FOOT WITHOUT CONTRAST  TECHNIQUE: Multidetector CT imaging of the right foot was performed according to the standard protocol. Multiplanar CT image reconstructions were also generated.  COMPARISON:  Radiography from earlier today  FINDINGS: Limited by osteopenia.  Base fractures of the first through fourth metatarsals  with intertarsal and first tarsal metatarsal extension. These fractures are nondisplaced. Associated fracture involving the lateral and distal aspect of the medial cuneiform consistent with Lisfranc ligament avulsion. No offset at the Lisfranc joint.  Irregularity at the first metatarsal head is attributed to osteoarthritic spurring. No definite fracture at this level.  There is a coronal oblique fracture through the distal fibula without displacement, partly visualized. Previous plate and screw fixation of the distal fibula. Coronal fracture across the posterior malleolus without displacement. Nondisplaced medial malleolus tip avulsion fracture.  No gross displacement or rupture of tendons at the ankle.  IMPRESSION: Lisfranc ligament avulsion fracture of the medial cuneiform.  Nondisplaced first through fourth metatarsal base fractures.  Nondisplaced posterior and medial malleolus fractures. Nondisplaced distal fibular fracture, partly visualized.   Electronically Signed   By: Monte Fantasia M.D.   On: 08/01/2015 21:47   Dg Foot Complete Right  08/01/2015  CLINICAL DATA:  Fall with right foot injury  EXAM: RIGHT FOOT COMPLETE - 3+ VIEW  COMPARISON:  None.  FINDINGS: There is a comminuted nondisplaced intra-articular fracture of the proximal right first metatarsal. There is a comminuted nondisplaced intra-articular fracture of the medial proximal right second metatarsal. There are comminuted nondisplaced intra-articular fractures of the proximal right third and fourth metatarsals. There is a nondisplaced fracture of the tip of the medial malleolus. No additional fracture is seen in the right foot. No dislocation is seen in the right foot. No suspicious focal osseous lesion. There is a moderate hallux valgus deformity with associated first metatarsal phalangeal joint osteoarthritis and medial soft tissue bunion. Small plantar right calcaneal spur. Partially visualized is a surgical plate with interlocking screws  in the distal right fibula. There is prominent soft tissue swelling surrounding each of the fracture sites in the right foot.  IMPRESSION: 1. Nondisplaced comminuted intra-articular fractures of the proximal first, second, third and fourth metatarsals in the right foot. The proximal first and second metatarsal fractures involve the Lisfranc joint. No subluxation in the right foot. 2. Nondisplaced right medial malleolar tip fracture.   Electronically Signed   By: Ilona Sorrel M.D.   On: 08/01/2015 18:30    ASSESSMENT AND PLAN: This is a very pleasant 74 years old white female with metastatic non-small cell lung cancer, squamous cell carcinoma who is currently undergoing treatment with immunotherapy with Nivolumab status post 27 cycles and tolerating her treatment fairly well except for recently several episodes of diarrhea concerning to be immune mediated from her treatment but other etiologies cannot be excluded. I recommended for the patient to have stool tested for C. Difficile. I also recommended for her to hold her treatment today and reschedule it for next week after improvement of the diarrhea. For the hyperglycemia, I will give the patient regular insulin 20 units today. I also advised her to discuss her diabetes medicine with her primary care physician. For the recent right ankle fracture, the patient will continue physical therapy at the nursing facility. She would come back for follow-up visit in one week for reevaluation before resuming her treatment. CODE STATUS was discussed with the patient and she is no CODE BLUE. The patient was advised to call immediately if she has any concerning symptoms in the interval. The patient voices understanding of current disease status and treatment options and is in agreement with the current care plan.  All questions were answered. The patient knows to call the clinic with any problems, questions or concerns. We can certainly see the patient much sooner if  necessary.  Disclaimer: This note was dictated with voice recognition software. Similar sounding words can inadvertently be transcribed and may not be corrected upon review.

## 2015-08-04 NOTE — Progress Notes (Signed)
Per Diane, RN, pt not to receive any treatment today.  Pt brought back to infusion for insulin administration.  20 units Novulin given for BS 542.  Stool sample obtained for CDiff testing.  Pt discharged via wheelchair to scheduling.

## 2015-08-04 NOTE — Telephone Encounter (Signed)
Per staff message and POF I have scheduled appts. Advised scheduler of appts and first available given. JMW  

## 2015-08-06 ENCOUNTER — Other Ambulatory Visit: Payer: Self-pay | Admitting: Medical Oncology

## 2015-08-06 DIAGNOSIS — C3490 Malignant neoplasm of unspecified part of unspecified bronchus or lung: Secondary | ICD-10-CM

## 2015-08-10 ENCOUNTER — Ambulatory Visit: Payer: Medicare Other | Admitting: Physician Assistant

## 2015-08-10 ENCOUNTER — Other Ambulatory Visit: Payer: Medicare Other

## 2015-08-11 ENCOUNTER — Ambulatory Visit: Payer: Medicare Other

## 2015-08-11 ENCOUNTER — Ambulatory Visit (HOSPITAL_BASED_OUTPATIENT_CLINIC_OR_DEPARTMENT_OTHER): Payer: Medicare Other

## 2015-08-11 ENCOUNTER — Ambulatory Visit: Payer: Medicare Other | Admitting: Internal Medicine

## 2015-08-11 ENCOUNTER — Other Ambulatory Visit: Payer: Self-pay | Admitting: *Deleted

## 2015-08-11 ENCOUNTER — Other Ambulatory Visit: Payer: Medicare Other

## 2015-08-11 ENCOUNTER — Telehealth: Payer: Self-pay | Admitting: Internal Medicine

## 2015-08-11 ENCOUNTER — Encounter: Payer: Self-pay | Admitting: Internal Medicine

## 2015-08-11 ENCOUNTER — Ambulatory Visit (HOSPITAL_BASED_OUTPATIENT_CLINIC_OR_DEPARTMENT_OTHER): Payer: Medicare Other | Admitting: Internal Medicine

## 2015-08-11 VITALS — BP 107/65 | HR 121 | Temp 98.2°F | Resp 17 | Ht 62.0 in | Wt 125.0 lb

## 2015-08-11 DIAGNOSIS — Z5112 Encounter for antineoplastic immunotherapy: Secondary | ICD-10-CM

## 2015-08-11 DIAGNOSIS — R197 Diarrhea, unspecified: Secondary | ICD-10-CM | POA: Diagnosis not present

## 2015-08-11 DIAGNOSIS — Z789 Other specified health status: Secondary | ICD-10-CM | POA: Insufficient documentation

## 2015-08-11 DIAGNOSIS — C342 Malignant neoplasm of middle lobe, bronchus or lung: Secondary | ICD-10-CM

## 2015-08-11 DIAGNOSIS — K521 Toxic gastroenteritis and colitis: Secondary | ICD-10-CM

## 2015-08-11 DIAGNOSIS — K7689 Other specified diseases of liver: Secondary | ICD-10-CM | POA: Insufficient documentation

## 2015-08-11 DIAGNOSIS — E748 Other specified disorders of carbohydrate metabolism: Secondary | ICD-10-CM | POA: Diagnosis not present

## 2015-08-11 DIAGNOSIS — E1165 Type 2 diabetes mellitus with hyperglycemia: Secondary | ICD-10-CM | POA: Diagnosis not present

## 2015-08-11 DIAGNOSIS — C3490 Malignant neoplasm of unspecified part of unspecified bronchus or lung: Secondary | ICD-10-CM

## 2015-08-11 DIAGNOSIS — C3491 Malignant neoplasm of unspecified part of right bronchus or lung: Secondary | ICD-10-CM

## 2015-08-11 LAB — CBC WITH DIFFERENTIAL/PLATELET
BASO%: 0.9 % (ref 0.0–2.0)
BASOS ABS: 0.1 10*3/uL (ref 0.0–0.1)
EOS ABS: 1.4 10*3/uL — AB (ref 0.0–0.5)
EOS%: 10.2 % — ABNORMAL HIGH (ref 0.0–7.0)
HEMATOCRIT: 40.6 % (ref 34.8–46.6)
HEMOGLOBIN: 13.2 g/dL (ref 11.6–15.9)
LYMPH#: 0.8 10*3/uL — AB (ref 0.9–3.3)
LYMPH%: 6.2 % — ABNORMAL LOW (ref 14.0–49.7)
MCH: 30.5 pg (ref 25.1–34.0)
MCHC: 32.5 g/dL (ref 31.5–36.0)
MCV: 93.7 fL (ref 79.5–101.0)
MONO#: 0.9 10*3/uL (ref 0.1–0.9)
MONO%: 6.8 % (ref 0.0–14.0)
NEUT#: 10.1 10*3/uL — ABNORMAL HIGH (ref 1.5–6.5)
NEUT%: 75.9 % (ref 38.4–76.8)
Platelets: 376 10*3/uL (ref 145–400)
RBC: 4.33 10*6/uL (ref 3.70–5.45)
RDW: 14.4 % (ref 11.2–14.5)
WBC: 13.3 10*3/uL — AB (ref 3.9–10.3)

## 2015-08-11 LAB — COMPREHENSIVE METABOLIC PANEL (CC13)
ALBUMIN: 2.8 g/dL — AB (ref 3.5–5.0)
ALK PHOS: 231 U/L — AB (ref 40–150)
ALT: 53 U/L (ref 0–55)
AST: 128 U/L — AB (ref 5–34)
Anion Gap: 8 mEq/L (ref 3–11)
BILIRUBIN TOTAL: 0.36 mg/dL (ref 0.20–1.20)
BUN: 9.7 mg/dL (ref 7.0–26.0)
CALCIUM: 8.7 mg/dL (ref 8.4–10.4)
CO2: 22 mEq/L (ref 22–29)
Chloride: 109 mEq/L (ref 98–109)
Creatinine: 0.7 mg/dL (ref 0.6–1.1)
EGFR: 86 mL/min/{1.73_m2} — AB (ref >90)
GLUCOSE: 210 mg/dL — AB (ref 70–140)
Potassium: 4.9 mEq/L (ref 3.5–5.1)
SODIUM: 139 meq/L (ref 136–145)
TOTAL PROTEIN: 5.4 g/dL — AB (ref 6.4–8.3)

## 2015-08-11 MED ORDER — PREDNISONE 20 MG PO TABS: 60.0000 mg | ORAL_TABLET | Freq: Every day | ORAL | Status: DC

## 2015-08-11 NOTE — Progress Notes (Signed)
[place] [phone]   [phone]  OFFICE PROGRESS NOTE  [name], MD [address] [place] [address] [place] [address]  DIAGNOSIS: Squamous cell lung cancer  Primary site: Lung (Right)  Staging method: AJCC 7th Edition  Clinical: Stage IIIB (T3, N3, M0)  Summary: Stage IIIB (T3, N3, M0)   PRIOR THERAPY:  1) Concurrent chemoradiation with weekly chemotherapy in the form of carboplatin for AUC of 2 and paclitaxel 45 mg/m2. Status post 7 week of treatment. Last dose was given 01/27/2014 no significant response in her disease.  2) Consolidation chemotherapy with carboplatin for AUC of 5 and paclitaxel 175 mg/M2 every 3 weeks with Neulasta support. First dose on 04/15/2014. Status post 3 cycles. Carboplatin was discontinued starting cycle #2 secondary to hypersensitivity reaction.  CURRENT THERAPY: Immunotherapy with Opdivo (Nivolumab) '3mg'$ /kg given every 2 weeks. Status post 27 cycles.  CHEMOTHERAPY INTENT: Control/Palliative  CURRENT # OF CHEMOTHERAPY CYCLES: 28 CURRENT ANTIEMETICS: Zofran, dexamethasone, Compazine  CURRENT SMOKING STATUS: Former smoker, quit 12/05/1997  ORAL CHEMOTHERAPY AND CONSENT: n/a  CURRENT BISPHOSPHONATES USE: None  PAIN MANAGEMENT: no cancer related pain, patient does have RA  NARCOTICS INDUCED CONSTIPATION: none  LIVING WILL AND CODE STATUS: ?  INTERVAL HISTORY: MARQUITTA [name] 74 y.o. female returns to the clinic today for follow-up visit. The patient is currently on immunotherapy with Nivolumab status post 27 cycles. She is tolerating her treatment well with no significant adverse effects but over the last 1-2 weeks she continues to have diarrhea on daily basis up to 6 times a day few days ago. She has a test for C. difficile that was negative. She still undergoing rehabilitation after sustaining several fractures of the right lower extremity recently. She is currently a resident of  the [facility] skilled nursing facility. She denied having any significant weight loss or night sweats. The patient denied having any chest pain, shortness of breath, cough or hemoptysis. She has no significant nausea or vomiting, no fever or chills. She is here for reevaluation before resuming her treatment with immunotherapy.  MEDICAL HISTORY: Past Medical History  Diagnosis Date  . Hypertension   . Rheumatoid arthritis ([place])   . GERD (gastroesophageal reflux disease)     no meds for  . Hx of radiation therapy 12/16/13-01/30/14    lung 66Gy  . Neuropathy ([place])   . Shortness of breath dyspnea     with exertion  . History of hiatal hernia   . Diabetes mellitus     insulin  . Cancer ([place]) dx'd 09/2013    Lung ca    ALLERGIES:  is allergic to remicade; carboplatin; shellfish-derived products; and hydromorphone.  MEDICATIONS:  Current Outpatient Prescriptions  Medication Sig Dispense Refill  . B-D ULTRAFINE III SHORT PEN 31G X 8 MM MISC   0  . DULoxetine (CYMBALTA) 60 MG capsule Take 60 mg by mouth daily.   11  . fluticasone (CUTIVATE) 0.05 % cream Apply 1 application topically 2 (two) times daily as needed (rash).     . Fluticasone-Salmeterol (ADVAIR DISKUS) 100-50 MCG/DOSE AEPB Inhale 1 puff into the lungs 2 (two) times daily. 60 each 5  . Gabapentin, Once-Daily, (GRALISE) 600 MG TABS Take 1,800 mg by mouth daily. 90 tablet 11  . HYDROcodone-acetaminophen (NORCO/VICODIN) 5-325 MG tablet Take 1-2 tablets by mouth every 6 (six) hours as needed. 10 tablet 0  . ibuprofen (ADVIL,MOTRIN) 600 MG tablet Take 1 tablet (600 mg total) by mouth every 6 (six)  hours as needed. Take with food. (Patient taking differently: Take 600 mg by mouth every 6 (six) hours as needed (pain). Take with food.) 30 tablet 0  . insulin detemir (LEVEMIR) 100 UNIT/ML injection Inject 28 Units into the skin at bedtime.    . insulin lispro (HUMALOG) 100 UNIT/ML injection Inject 6 Units into the skin 4 (four) times daily -  after meals and at bedtime.    [name] loperamide (IMODIUM A-D) 2 MG tablet Take 2 mg by mouth 4 (four) times daily as needed for diarrhea or loose stools.    [name] losartan-hydrochlorothiazide (HYZAAR) 100-25 MG tablet Take 1/2 tablet daily  2  . ONETOUCH DELICA LANCETS FINE MISC   0  . pantoprazole (PROTONIX) 40 MG tablet TAKE 1 TABLET BY MOUTH DAILY 30 tablet 0  . potassium chloride SA (K-DUR,KLOR-CON) 20 MEQ tablet Take 40 mEq by mouth daily.    . Probiotic Product (PROBIOTIC DAILY PO) Take 1 tablet by mouth daily.    [name] sulfaSALAzine (AZULFIDINE) 500 MG EC tablet Take 1,000 mg by mouth 2 (two) times daily.     No current facility-administered medications for this visit.   Facility-Administered Medications Ordered in Other Visits  Medication Dose Route Frequency Provider Last Rate Last Dose  . sodium chloride 0.9 % injection 10 mL  10 mL Intracatheter PRN Curt Bears, MD   10 mL at 12/30/13 1121    SURGICAL HISTORY:  Past Surgical History  Procedure Laterality Date  . Cholecystectomy    . Abdominal hysterectomy    . Dilation and curettage of uterus    . Abdominal surgery    . Tonsillectomy    . Esophagogastroduodenoscopy (egd) with propofol N/A 10/09/2014    Procedure: ESOPHAGOGASTRODUODENOSCOPY (EGD) WITH PROPOFOL;  Surgeon: Inda Castle, MD;  Location: [facility];  Service: Endoscopy;  Laterality: N/A;  . Savory dilation N/A 10/09/2014    Procedure: SAVORY DILATION;  Surgeon: Inda Castle, MD;  Location: [facility];  Service: Endoscopy;  Laterality: N/A;  . Esophagogastroduodenoscopy N/A 12/12/2014    Procedure: ESOPHAGOGASTRODUODENOSCOPY (EGD);  Surgeon: Inda Castle, MD;  Location: [facility];  Service: Endoscopy;  Laterality: N/A;  with dilation  . Balloon dilation N/A 12/12/2014    Procedure: BALLOON DILATION;  Surgeon: Inda Castle, MD;  Location: [facility];  Service: Endoscopy;  Laterality: N/A;  . Orif ankle fracture Right 07/04/2015    Procedure: OPEN  REDUCTION INTERNAL FIXATION (ORIF) ANKLE FRACTURE;  Surgeon: Newt Minion, MD;  Location: [place] [facility];  Service: Orthopedics;  Laterality: Right;  OPEN REDUCTION, INTERNAL FIXATION OF RIGHT ANKLE FRACTURE.     REVIEW OF SYSTEMS:  Constitutional: positive for fatigue Eyes: negative Ears, nose, mouth, throat, and face: negative Respiratory: positive for dyspnea on exertion Cardiovascular: negative Gastrointestinal: positive for diarrhea Genitourinary:negative Integument/breast: negative Hematologic/lymphatic: negative Musculoskeletal:positive for bone pain Neurological: negative Behavioral/Psych: negative Endocrine: negative Allergic/Immunologic: negative   PHYSICAL EXAMINATION: General appearance: alert, cooperative, fatigued and no distress Head: Normocephalic, without obvious abnormality, atraumatic Neck: no adenopathy, no JVD, supple, symmetrical, trachea midline and thyroid not enlarged, symmetric, no tenderness/mass/nodules Lymph nodes: Cervical, supraclavicular, and axillary nodes normal. Resp: clear to auscultation bilaterally Back: symmetric, no curvature. ROM normal. No CVA tenderness. Cardio: regular rate and rhythm, S1, S2 normal, no murmur, click, rub or gallop GI: soft, non-tender; bowel sounds normal; no masses,  no organomegaly Extremities: extremities normal, atraumatic, no cyanosis or edema Neurologic: Alert and oriented X 3, normal strength and tone. Normal symmetric reflexes. Normal coordination and  gait  ECOG PERFORMANCE STATUS: 1 - Symptomatic but completely ambulatory  Blood pressure 107/65, pulse 121, temperature 98.2 F (36.8 C), temperature source Oral, resp. rate 17, height '5\' 2"'$  (1.575 m), weight 125 lb (56.7 kg), SpO2 95 %.  LABORATORY DATA: Lab Results  Component Value Date   WBC 13.3* 08/11/2015   HGB 13.2 08/11/2015   HCT 40.6 08/11/2015   MCV 93.7 08/11/2015   PLT 376 08/11/2015      Chemistry      Component Value Date/Time   NA 132* 08/04/2015  0955   NA 134* 08/01/2015 2021   K 4.6 08/04/2015 0955   K 3.7 08/01/2015 2021   CL 98* 08/01/2015 2021   CO2 16* 08/04/2015 0955   CO2 30 08/01/2015 2021   BUN 15.5 08/04/2015 0955   BUN 15 08/01/2015 2021   CREATININE 1.1 08/04/2015 0955   CREATININE 0.70 08/01/2015 2021   GLU 299* 02/02/2015 1348      Component Value Date/Time   CALCIUM 9.1 08/04/2015 0955   CALCIUM 8.7* 08/01/2015 2021   ALKPHOS 117 08/04/2015 0955   ALKPHOS 87 07/04/2015 0649   AST 12 08/04/2015 0955   AST 17 07/04/2015 0649   ALT 17 08/04/2015 0955   ALT 12* 07/04/2015 0649   BILITOT 0.63 08/04/2015 0955   BILITOT 0.3 07/04/2015 0649       RADIOGRAPHIC STUDIES: Dg Ankle Complete Right  08/01/2015   CLINICAL DATA:  Right ankle and foot pain with onset of weakness after falling yesterday . ORIF 07/04/2015.  EXAM: RIGHT ANKLE - COMPLETE 3+ VIEW  COMPARISON:  07/01/2015  FINDINGS: Interval plate and screw fixation of the distal fibula. The medial malleolar tip fracture is again identified. There is also a posterior tibial fracture which is healing. No acute hardware complication. Mild bimalleolar soft tissue swelling is nonspecific, given prior surgery. No new fracture identified. Base of fifth metatarsal and talar dome intact. Small calcaneal spur.  IMPRESSION: Postsurgical in nonacute posttraumatic changes. No hardware complication or convincing evidence of acute osseous finding.   Electronically Signed   By: Abigail Miyamoto M.D.   On: 08/01/2015 18:32   Ct Foot Right Wo Contrast  08/01/2015   CLINICAL DATA:  Evaluate metatarsals of the right foot. Initial encounter.  EXAM: CT OF THE RIGHT FOOT WITHOUT CONTRAST  TECHNIQUE: Multidetector CT imaging of the right foot was performed according to the standard protocol. Multiplanar CT image reconstructions were also generated.  COMPARISON:  Radiography from earlier today  FINDINGS: Limited by osteopenia.  Base fractures of the first through fourth metatarsals with  intertarsal and first tarsal metatarsal extension. These fractures are nondisplaced. Associated fracture involving the lateral and distal aspect of the medial cuneiform consistent with Lisfranc ligament avulsion. No offset at the Lisfranc joint.  Irregularity at the first metatarsal head is attributed to osteoarthritic spurring. No definite fracture at this level.  There is a coronal oblique fracture through the distal fibula without displacement, partly visualized. Previous plate and screw fixation of the distal fibula. Coronal fracture across the posterior malleolus without displacement. Nondisplaced medial malleolus tip avulsion fracture.  No gross displacement or rupture of tendons at the ankle.  IMPRESSION: Lisfranc ligament avulsion fracture of the medial cuneiform.  Nondisplaced first through fourth metatarsal base fractures.  Nondisplaced posterior and medial malleolus fractures. Nondisplaced distal fibular fracture, partly visualized.   Electronically Signed   By: Monte Fantasia M.D.   On: 08/01/2015 21:47   Dg Foot Complete Right  08/01/2015  CLINICAL DATA:  Fall with right foot injury  EXAM: RIGHT FOOT COMPLETE - 3+ VIEW  COMPARISON:  None.  FINDINGS: There is a comminuted nondisplaced intra-articular fracture of the proximal right first metatarsal. There is a comminuted nondisplaced intra-articular fracture of the medial proximal right second metatarsal. There are comminuted nondisplaced intra-articular fractures of the proximal right third and fourth metatarsals. There is a nondisplaced fracture of the tip of the medial malleolus. No additional fracture is seen in the right foot. No dislocation is seen in the right foot. No suspicious focal osseous lesion. There is a moderate hallux valgus deformity with associated first metatarsal phalangeal joint osteoarthritis and medial soft tissue bunion. Small plantar right calcaneal spur. Partially visualized is a surgical plate with interlocking screws in  the distal right fibula. There is prominent soft tissue swelling surrounding each of the fracture sites in the right foot.  IMPRESSION: 1. Nondisplaced comminuted intra-articular fractures of the proximal first, second, third and fourth metatarsals in the right foot. The proximal first and second metatarsal fractures involve the Lisfranc joint. No subluxation in the right foot. 2. Nondisplaced right medial malleolar tip fracture.   Electronically Signed   By: Ilona Sorrel M.D.   On: 08/01/2015 18:30    ASSESSMENT AND PLAN: This is a very pleasant 74 years old white female with metastatic non-small cell lung cancer, squamous cell carcinoma who is currently undergoing treatment with immunotherapy with Nivolumab status post 27 cycles and tolerating her treatment fairly well except for recently several episodes of diarrhea concerning to be immune mediated from her treatment. C. difficile test was negative. The patient was found also today to have elevated liver enzymes questionable for immune mediated hepatitis. I recommended for the patient to hold her treatment with immunotherapy for now. I will start the patient on high dose of prednisone 1 mg/KG on daily basis. I will continue to monitor her closely with repeat blood work and visit in one week. For the hyperglycemia, I also advised her to discuss her diabetes medicine with her primary care physician. For the recent right ankle fracture, the patient will continue physical therapy at the nursing facility. She would come back for follow-up visit in one week for reevaluation before resuming her treatment. The patient was advised to call immediately if she has any concerning symptoms in the interval. The patient voices understanding of current disease status and treatment options and is in agreement with the current care plan.  All questions were answered. The patient knows to call the clinic with any problems, questions or concerns. We can certainly see the  patient much sooner if necessary.  Disclaimer: This note was dictated with voice recognition software. Similar sounding words can inadvertently be transcribed and may not be corrected upon review.

## 2015-08-11 NOTE — Telephone Encounter (Signed)
gave adn printed appt sched and avs

## 2015-08-12 ENCOUNTER — Ambulatory Visit (INDEPENDENT_AMBULATORY_CARE_PROVIDER_SITE_OTHER): Payer: Medicare Other | Admitting: Neurology

## 2015-08-12 VITALS — BP 110/82 | HR 116

## 2015-08-12 DIAGNOSIS — G620 Drug-induced polyneuropathy: Secondary | ICD-10-CM | POA: Diagnosis not present

## 2015-08-12 DIAGNOSIS — T451X5A Adverse effect of antineoplastic and immunosuppressive drugs, initial encounter: Secondary | ICD-10-CM | POA: Diagnosis not present

## 2015-08-12 MED ORDER — DULOXETINE HCL 60 MG PO CPEP: 60.0000 mg | ORAL_CAPSULE | Freq: Two times a day (BID) | ORAL | Status: DC

## 2015-08-12 NOTE — Patient Instructions (Signed)
Remember to drink plenty of fluid, eat healthy meals and do not skip any meals. Try to eat protein with a every meal and eat a healthy snack such as fruit or nuts in between meals. Try to keep a regular sleep-wake schedule and try to exercise daily, particularly in the form of walking, 20-30 minutes a day, if you can.   As far as your medications are concerned, I would like to suggest: increase Cymbalta to '60mg'$  twice daily  I would like to see you back in 4 months, sooner if we need to. Please call us with any interim questions, concerns, problems, updates or refill requests.   Please also call us for any test results so we can go over those with you on the phone.  My clinical assistant and will answer any of your questions and relay your messages to me and also relay most of my messages to you.   Our phone number is [phone]. We also have an after hours call service for urgent matters and there is a physician on-call for urgent questions. For any emergencies you know to call 911 or go to the nearest emergency room

## 2015-08-12 NOTE — Progress Notes (Signed)
[facility]    Provider: Dr Jaynee Eagles Referring Provider: Anda Kraft, MD Primary Care Physician: [name], MD  CC: Peripheral neuropathy due to chemotherapy  HPI: This is a wonderful 74 years old white female with history of stage IIIB non-small cell lung cancer status postcarboplatin and paclitaxel with resultant painful neuropathy.   Interval History 08/12/2015: She has fallen and broken several bones in the right leg. She in a cast currently. She had to go to skilled nursing for several weeks. Her legs just buckled and she fell. It was late at night, she got up to go to the bathroom before she went to sleep. She got lightheaded and she crashed onher ankle. She crawled over to the phone and called 911 and went to the emergency room. She was Dxed with low blood pressure. At a later incident, she was walking around the bed and just went down, no LOC, no SOB, no CP. Not lightheaded. Her legs just buckled, both of them, did not trip. No weakness, she can get out of low seats, she can walk up stairs, no new weakness. She was very active all week, no illnesses or inciting events. Howvere she has had diarrhea for 2 months and she is dehydrated. She was placed on prednisone yesterday, cdiff was negative. She was not taking marinol. She was no taking vicadin or any pain killer at the time. Cymbalta is helping. Her left hand is getting number. She is not in pain with the neuropathy, more numbness. Before she gets up, she puts her head down to help with blood pressure. They decreased her BP medication, then stopped it entirely. Her glucose has been running very high, 8 days ago 542 and 1 day ago was 210 and she is following with her pcp.   Interval history 06/18/2015: 4pm Tuesday feet started burning significantly, like on fire on the bootm. This was new. The left hand also started to burn. She has never had it before. The burning is better. It was very difficult to walk. She  took ibuprofen and it helped. The burning went away in 20 minutes. The left more than the right. She had shoes on, no new shoes. She was reclining on her bed watching TV. Excruciating on the left. Does not feel the Lamictal is helping with the neuropathy or with mood.  Interval History 05/07/2015: This is a wonderful 74 years old white female with history of stage IIIB non-small cell lung cancer status postcarboplatin and paclitaxel with resultant painful neuropathy.The feet are about the same. Still on the Gralise. Lidocaine patches help somewhat. She is tolerating the Lamictal, no rashes. Discussed several options for her neuropathy, decided to slowly increase Lamictal to '50mg'$  then '75mg'$  and then '100mg'$ . This will hopefully help with her neuropathy and is a great medication for mood. Discussed at length. Ambien ishelping her sleep. Tried Lyrica before the Gralise and that did not work. The Gralise has worked the best so far.   Interval History 11/13/2014: She is feeling better. She is taking the marinol and can sleep at night. The pain is manageable during the day with the Gralise. She has a new diagnosis of diabetes. Her thyroid function is elevated. She takes daily B12. She is having some swelling in her legs which is manageable however it makes it difficult to drive. She still has tingling in the fingers.   Her doctors include  [facility].  Dr Hipolito Bayley Dr. Wilson Singer  Interval History 09/03/2014: She is still  struggling with her peripheral neuropathy, nothing is helping. It is severe. Maybe the Gralise helped but then she ran out and didn't call for a prescription. She was recently given prednisone, cymbalta. Cymbalta made her symptoms worse. Started the Gralise and the 1800 mg worked. Belsomra didn't help. Ambien helps her sleep. Lyrica didn't work either for her peripheral neuropathy. Takes b12 daily and biotin. She is sleeping better with Ambien but the neuropathy is still  severe. She can't fall asleep, possibly stress. Paresthesias are in the feet and hands. They occurred in the setting of platin-based chemotherapy. They are continuous, worse at night when sleeping. She is also very emotional, crying a lot. Her appetite is very poor.  Visit 08/13/14: This is a very pleasant 74 years old white female with history of stage IIIB non-small cell lung cancer status postcarboplatin and paclitaxel. Patient reports she had chemotherapy and radiation from [place] - April 2nd. 6 weeks later started a second set of chemo, 3 sessions every 3 weeks. The neuropathy started about 3 weeks ago. Feels like burning, pins and needles and lightning strikes. Feet and left hand > right hand. It is in the entire feet to the ankles and entire left hand and digits 3-5 right hand. Severe, cramping in the feet and calfs. She is crying in the office today. Oxycodone not helping with the pain, doesn't even touch it. Didn't try Lyrica. Cymbalta worsened the symptoms. Nothing is helping. She is using a cane. No falls. Balance if off. The pain is severe, 10/10 and keeps her from sleeping at night. Symptoms are continuous and never subside, but are worse at night.  Reviewed notes, labs and imaging from outside physicians, which showed: 1) Concurrent chemoradiation with weekly chemotherapy in the form of carboplatin for AUC of 2 and paclitaxel 45 mg/m2. Status post 7 week of treatment. Last dose was given 01/27/2014 no significant response in her disease.  2) Consolidation chemotherapy with carboplatin for AUC of 5 and paclitaxel 175 mg/M2 every 3 weeks with Neulasta support. First dose on 04/15/2014. Status post 3 cycles. Carboplatin was discontinued starting cycle #2 secondary to hypersensitivity reaction.  Her restaging scan showed evidence for disease progression and the patient is currently undergoing immunotherapy with Nivolumab 3 mg/KG every 2 weeks is status post 3 cycle  Review of  Systems: Patient complains of symptoms per HPI as well as the following symptoms: Cough, wheezing, shortness of breath, numbness, depression. Pertinent negatives per HPI. All others negative. .   Social History   Social History  . Marital Status: Divorced    Spouse Name: N/A  . Number of Children: 0  . Years of Education: MA   Occupational History  . Retired Other   Social History Main Topics  . Smoking status: Former Smoker -- 3.00 packs/day for 45 years    Types: Cigarettes    Quit date: 12/05/1997  . Smokeless tobacco: Never Used  . Alcohol Use: No  . Drug Use: No  . Sexual Activity: Not Currently   Other Topics Concern  . Not on file   Social History Narrative   Patient lives at home alone.   Caffeine Use: 2 cups daily    Family History  Problem Relation Age of Onset  . Hypertension Other   . Stroke Other   . Heart attack Other   . Hemachromatosis Other   . Rheum arthritis      both sets of grandparents and father  . Other Mother   . Other Father  failure to thrive    Past Medical History  Diagnosis Date  . Hypertension   . Rheumatoid arthritis ([place])   . GERD (gastroesophageal reflux disease)     no meds for  . Hx of radiation therapy 12/16/13-01/30/14    lung 66Gy  . Neuropathy ([place])   . Shortness of breath dyspnea     with exertion  . History of hiatal hernia   . Diabetes mellitus     insulin  . Cancer [facility]) dx'd 09/2013    Lung ca    Past Surgical History  Procedure Laterality Date  . Cholecystectomy    . Abdominal hysterectomy    . Dilation and curettage of uterus    . Abdominal surgery    . Tonsillectomy    . Esophagogastroduodenoscopy (egd) with propofol N/A 10/09/2014    Procedure: ESOPHAGOGASTRODUODENOSCOPY (EGD) WITH PROPOFOL;  Surgeon: Inda Castle, MD;  Location: [place];  Service: Endoscopy;  Laterality: N/A;  . Savory dilation N/A 10/09/2014    Procedure: SAVORY DILATION;  Surgeon: Inda Castle, MD;  Location: [place];  Service: Endoscopy;  Laterality: N/A;  . Esophagogastroduodenoscopy N/A 12/12/2014    Procedure: ESOPHAGOGASTRODUODENOSCOPY (EGD);  Surgeon: Inda Castle, MD;  Location: [facility];  Service: Endoscopy;  Laterality: N/A;  with dilation  . Balloon dilation N/A 12/12/2014    Procedure: BALLOON DILATION;  Surgeon: Inda Castle, MD;  Location: [facility];  Service: Endoscopy;  Laterality: N/A;  . Orif ankle fracture Right 07/04/2015    Procedure: OPEN REDUCTION INTERNAL FIXATION (ORIF) ANKLE FRACTURE;  Surgeon: Newt Minion, MD;  Location: [place];  Service: Orthopedics;  Laterality: Right;  OPEN REDUCTION, INTERNAL FIXATION OF RIGHT ANKLE FRACTURE.     Current Outpatient Prescriptions  Medication Sig Dispense Refill  . B-D ULTRAFINE III SHORT PEN 31G X 8 MM MISC   0  . fluticasone (CUTIVATE) 0.05 % cream Apply 1 application topically 2 (two) times daily as needed (rash).     . Fluticasone-Salmeterol (ADVAIR DISKUS) 100-50 MCG/DOSE AEPB Inhale 1 puff into the lungs 2 (two) times daily. 60 each 5  . Gabapentin, Once-Daily, (GRALISE) 600 MG TABS Take 1,800 mg by mouth daily. 90 tablet 11  . HYDROcodone-acetaminophen (NORCO/VICODIN) 5-325 MG tablet Take 1-2 tablets by mouth every 6 (six) hours as needed. 10 tablet 0  . ibuprofen (ADVIL,MOTRIN) 600 MG tablet Take 1 tablet (600 mg total) by mouth every 6 (six) hours as needed. Take with food. (Patient taking differently: Take 600 mg by mouth every 6 (six) hours as needed (pain). Take with food.) 30 tablet 0  . insulin detemir (LEVEMIR) 100 UNIT/ML injection Inject 28 Units into the skin at bedtime.    . insulin lispro (HUMALOG) 100 UNIT/ML injection Inject 6 Units into the skin 4 (four) times daily - after meals and at bedtime.    [name] loperamide (IMODIUM A-D) 2 MG tablet Take 2 mg by mouth 4 (four) times daily as needed for diarrhea or loose stools.    [name] DELICA LANCETS FINE MISC   0  . pantoprazole (PROTONIX) 40 MG tablet TAKE 1  TABLET BY MOUTH DAILY 30 tablet 0  . potassium chloride SA (K-DUR,KLOR-CON) 20 MEQ tablet Take 40 mEq by mouth daily.    . predniSONE (DELTASONE) 20 MG tablet Take 3 tablets (60 mg total) by mouth daily with breakfast. 90 tablet 0  . Probiotic Product (PROBIOTIC DAILY PO) Take 1 tablet by mouth daily.    [name] sulfaSALAzine (AZULFIDINE)  500 MG EC tablet Take 1,000 mg by mouth 2 (two) times daily.    . DULoxetine (CYMBALTA) 60 MG capsule Take 1 capsule (60 mg total) by mouth 2 (two) times daily. 60 capsule 11   No current facility-administered medications for this visit.   Facility-Administered Medications Ordered in Other Visits  Medication Dose Route Frequency Provider Last Rate Last Dose  . sodium chloride 0.9 % injection 10 mL  10 mL Intracatheter PRN Curt Bears, MD   10 mL at 12/30/13 1121    Allergies as of 08/12/2015 - Review Complete 08/11/2015  Allergen Reaction Noted  . Remicade [infliximab] Anaphylaxis 05/13/2012  . Carboplatin Other (See Comments) 05/29/2014  . Shellfish-derived products Diarrhea 06/09/2015  . Hydromorphone Rash 08/11/2014    Vitals: BP 110/82 mmHg  Pulse 116  SpO2 87% Last Weight:  Wt Readings from Last 1 Encounters:  08/11/15 125 lb (56.7 kg)   Last Height:   Ht Readings from Last 1 Encounters:  08/11/15 '5\' 2"'$  (1.575 m)   Sensation (stable): Absent vibration and proprioception in the distal extremities. Impaired pp and temp in the distal extremities. Severe Allodynia and hyperesthesias bottom of the feet. +Romberg.   Reflex Exam: Absent ankle jerks   Assessment/Plan: This is a wonderful 74 year old white female with history of stage IIIB non-small cell lung cancer status postcarboplatin and paclitaxel with resultant severe neuropathy. Exam shows severe loss of sensation in the distal extremities, +Romberg, Ataxic gait and Allodynia and hyperesthesias. Very distressing to patient, . Keeps her up at night with pain. Gralise has helped with the  pain during the day.   1. Will continue Gralise '1800mg'$  daily 2. We'll continue the lidocaine topical and patches. 3. discontinued Lamictal 4. increase Cymbalta instead 5. 3 months follow up or sooner if needed  6. offered to try and have Botox approved. There is literature stating its use and peripheral polyneuropathy. The difficult task would be to have insurance approve it. She like to hold off on this right now.   Sarina Ill, MD  [facility] [address] [place], [place] [address]  Phone [phone] Fax [phone]  A total of 25 minutes was spent face-to-face with this patient. Over half this time was spent on counseling patient on the polyneuropathy diagnosis and different diagnostic and therapeutic options available.

## 2015-08-13 ENCOUNTER — Encounter: Payer: Self-pay | Admitting: Neurology

## 2015-08-18 ENCOUNTER — Other Ambulatory Visit: Payer: Medicare Other

## 2015-08-18 ENCOUNTER — Ambulatory Visit: Payer: Medicare Other | Admitting: Physician Assistant

## 2015-08-25 ENCOUNTER — Ambulatory Visit (HOSPITAL_BASED_OUTPATIENT_CLINIC_OR_DEPARTMENT_OTHER): Payer: Medicare Other | Admitting: Physician Assistant

## 2015-08-25 ENCOUNTER — Ambulatory Visit: Payer: Medicare Other

## 2015-08-25 ENCOUNTER — Other Ambulatory Visit: Payer: Self-pay | Admitting: Physician Assistant

## 2015-08-25 ENCOUNTER — Telehealth: Payer: Self-pay | Admitting: Internal Medicine

## 2015-08-25 ENCOUNTER — Other Ambulatory Visit (HOSPITAL_BASED_OUTPATIENT_CLINIC_OR_DEPARTMENT_OTHER): Payer: Medicare Other

## 2015-08-25 VITALS — BP 105/61 | HR 104 | Temp 97.4°F | Resp 18 | Ht 62.0 in | Wt 123.2 lb

## 2015-08-25 DIAGNOSIS — Z79899 Other long term (current) drug therapy: Secondary | ICD-10-CM | POA: Diagnosis not present

## 2015-08-25 DIAGNOSIS — Z23 Encounter for immunization: Secondary | ICD-10-CM | POA: Diagnosis not present

## 2015-08-25 DIAGNOSIS — C342 Malignant neoplasm of middle lobe, bronchus or lung: Secondary | ICD-10-CM | POA: Diagnosis not present

## 2015-08-25 DIAGNOSIS — E1165 Type 2 diabetes mellitus with hyperglycemia: Secondary | ICD-10-CM | POA: Diagnosis not present

## 2015-08-25 DIAGNOSIS — R197 Diarrhea, unspecified: Secondary | ICD-10-CM

## 2015-08-25 DIAGNOSIS — C3491 Malignant neoplasm of unspecified part of right bronchus or lung: Secondary | ICD-10-CM

## 2015-08-25 LAB — CBC WITH DIFFERENTIAL/PLATELET
BASO%: 0.9 % (ref 0.0–2.0)
BASOS ABS: 0.1 10*3/uL (ref 0.0–0.1)
EOS%: 1.9 % (ref 0.0–7.0)
Eosinophils Absolute: 0.1 10*3/uL (ref 0.0–0.5)
HCT: 39.6 % (ref 34.8–46.6)
HEMOGLOBIN: 13 g/dL (ref 11.6–15.9)
LYMPH%: 20.4 % (ref 14.0–49.7)
MCH: 30.9 pg (ref 25.1–34.0)
MCHC: 32.9 g/dL (ref 31.5–36.0)
MCV: 93.7 fL (ref 79.5–101.0)
MONO#: 0.7 10*3/uL (ref 0.1–0.9)
MONO%: 8.7 % (ref 0.0–14.0)
NEUT#: 5.1 10*3/uL (ref 1.5–6.5)
NEUT%: 68.1 % (ref 38.4–76.8)
Platelets: 250 10*3/uL (ref 145–400)
RBC: 4.22 10*6/uL (ref 3.70–5.45)
RDW: 15.3 % — AB (ref 11.2–14.5)
WBC: 7.5 10*3/uL (ref 3.9–10.3)
lymph#: 1.5 10*3/uL (ref 0.9–3.3)

## 2015-08-25 LAB — COMPREHENSIVE METABOLIC PANEL (CC13)
ALT: 11 U/L (ref 0–55)
AST: 8 U/L (ref 5–34)
Albumin: 2.8 g/dL — ABNORMAL LOW (ref 3.5–5.0)
Alkaline Phosphatase: 111 U/L (ref 40–150)
Anion Gap: 8 mEq/L (ref 3–11)
BUN: 14 mg/dL (ref 7.0–26.0)
CALCIUM: 8.4 mg/dL (ref 8.4–10.4)
CHLORIDE: 107 meq/L (ref 98–109)
CO2: 25 meq/L (ref 22–29)
Creatinine: 0.8 mg/dL (ref 0.6–1.1)
EGFR: 79 mL/min/{1.73_m2} — ABNORMAL LOW (ref >90)
Glucose: 183 mg/dl — ABNORMAL HIGH (ref 70–140)
POTASSIUM: 3.5 meq/L (ref 3.5–5.1)
SODIUM: 140 meq/L (ref 136–145)
Total Bilirubin: 0.33 mg/dL (ref 0.20–1.20)
Total Protein: 5 g/dL — ABNORMAL LOW (ref 6.4–8.3)

## 2015-08-25 LAB — URINALYSIS, MICROSCOPIC - CHCC
BLOOD: NEGATIVE
Bilirubin (Urine): NEGATIVE
GLUCOSE UR CHCC: NEGATIVE mg/dL
Ketones: NEGATIVE mg/dL
LEUKOCYTE ESTERASE: NEGATIVE
Nitrite: NEGATIVE
Protein: 30 mg/dL
SPECIFIC GRAVITY, URINE: 1.03 (ref 1.003–1.035)
UROBILINOGEN UR: 0.2 mg/dL (ref 0.2–1)
pH: 6 (ref 4.6–8.0)

## 2015-08-25 LAB — TSH CHCC: TSH: 5.431 m[IU]/L — AB (ref 0.308–3.960)

## 2015-08-25 LAB — TECHNOLOGIST REVIEW

## 2015-08-25 LAB — LACTATE DEHYDROGENASE (CC13): LDH: 143 U/L (ref 125–245)

## 2015-08-25 MED ORDER — INFLUENZA VAC SPLIT QUAD 0.5 ML IM SUSY
0.5000 mL | PREFILLED_SYRINGE | Freq: Once | INTRAMUSCULAR | Status: AC
  Administered 2015-08-25: 0.5 mL via INTRAMUSCULAR
  Filled 2015-08-25: qty 0.5

## 2015-08-25 NOTE — Progress Notes (Signed)
Megan [name], Megan [name], Megan [name], M0)  Summary: Stage IIIB (T3, Megan [name], M0)   PRIOR THERAPY:  1) Concurrent chemoradiation with weekly chemotherapy in the form of carboplatin for AUC of 2 and paclitaxel 45 mg/m2. Status post 7 week of treatment. Last dose was given 01/27/2014 no significant response in her disease.  2) Consolidation chemotherapy with carboplatin for AUC of 5 and paclitaxel 175 mg/M2 every 3 weeks with Neulasta support. First dose on 04/15/2014. Status post 3 cycles. Carboplatin was discontinued starting cycle #2 secondary to hypersensitivity reaction.  CURRENT THERAPY: Immunotherapy with Opdivo (Nivolumab) '3mg'$ /kg given every 2 weeks. Status post 27 cycles.  CHEMOTHERAPY INTENT: Control/Palliative  CURRENT # OF CHEMOTHERAPY CYCLES: 28 CURRENT ANTIEMETICS: Zofran, dexamethasone, Compazine  CURRENT SMOKING STATUS: Former smoker, quit 12/05/1997  ORAL CHEMOTHERAPY AND CONSENT: n/a  CURRENT BISPHOSPHONATES USE: None  PAIN MANAGEMENT: no cancer related pain, patient does have RA  NARCOTICS INDUCED CONSTIPATION: none  LIVING WILL AND CODE STATUS: ?  INTERVAL HISTORY: CAPRICIA [name] 74 y.o. female returns to the clinic today for follow-up visit. The patient is currently on immunotherapy with Nivolumab status post 27 cycles. She was tolerating her treatment well with no significant adverse effects but over the last 4 weeks she developed diarrhea on daily basis up to 7-8 times for which she was prescribed Prednisone 60 mg daily, with a slight improvement. She is also taking Imodium. Sheis C. difficile negative. In addition, she had UTI 3 weeks ago, for which she was prescribed antibiotic, which  may have exacerbated her diarrhea. She has some burning with urination and her UA is pending. Her appetite is slightly improved since having been discharged from rehab facility.  She still undergoing outpatient rehabilitation after sustaining several fractures of the right lower extremity recently.  She denied having any significant weight loss or night sweats. The patient denied having any chest pain. Her shortness of breath is not worse, without cough. She denies any hemoptysis. She has no significant nausea or vomiting, no fever or chills. She has chronic neuropathy. She is here for reevaluation before resuming her treatment with immunotherapy. She is also requesting flu shot today.  MEDICAL HISTORY: Past Medical History  Diagnosis Date  . Hypertension   . Rheumatoid arthritis ([place])   . GERD (gastroesophageal reflux disease)     no meds for  . Hx of radiation therapy 12/16/13-01/30/14    lung 66Gy  . Neuropathy ([place])   . Shortness of breath dyspnea     with exertion  . History of hiatal hernia   . Diabetes mellitus     insulin  . Cancer ([place]) dx'd 09/2013    Lung ca    ALLERGIES:  is allergic to remicade; carboplatin; shellfish-derived products; and hydromorphone.  MEDICATIONS:  Current Outpatient Prescriptions  Medication Sig Dispense Refill  . B-D ULTRAFINE III SHORT PEN 31G X 8 MM MISC   0  . DULoxetine (CYMBALTA) 60 MG capsule Take 1 capsule (60 mg total) by mouth 2 (two) times daily. 60 capsule 11  . fluticasone (CUTIVATE) 0.05 % cream Apply 1 application topically 2 (two) times daily as needed (rash).     . Fluticasone-Salmeterol (ADVAIR DISKUS) 100-50 MCG/DOSE AEPB Inhale 1 puff into the  lungs 2 (two) times daily. 60 each 5  . Gabapentin, Once-Daily, (GRALISE) 600 MG TABS Take 1,800 mg by mouth daily. 90 tablet 11  . HYDROcodone-acetaminophen (NORCO/VICODIN) 5-325 MG tablet Take 1-2 tablets by mouth every 6 (six) hours as needed. 10 tablet 0  . ibuprofen (ADVIL,MOTRIN) 600 MG  tablet Take 1 tablet (600 mg total) by mouth every 6 (six) hours as needed. Take with food. (Patient taking differently: Take 600 mg by mouth every 6 (six) hours as needed (pain). Take with food.) 30 tablet 0  . insulin detemir (LEVEMIR) 100 UNIT/ML injection Inject 28 Units into the skin at bedtime.    . insulin lispro (HUMALOG) 100 UNIT/ML injection Inject 6 Units into the skin 4 (four) times daily - after meals and at bedtime.    Megan [name] loperamide (IMODIUM A-D) 2 MG tablet Take 2 mg by mouth 4 (four) times daily as needed for diarrhea or loose stools.    Megan [name] DELICA LANCETS FINE MISC   0  . pantoprazole (PROTONIX) 40 MG tablet TAKE 1 TABLET BY MOUTH DAILY 30 tablet 0  . potassium chloride SA (K-DUR,KLOR-CON) 20 MEQ tablet Take 40 mEq by mouth daily.    . predniSONE (DELTASONE) 20 MG tablet Take 3 tablets (60 mg total) by mouth daily with breakfast. 90 tablet 0  . Probiotic Product (PROBIOTIC DAILY PO) Take 1 tablet by mouth daily.    Megan [name] sulfaSALAzine (AZULFIDINE) 500 MG EC tablet Take 1,000 mg by mouth 2 (two) times daily.     No current facility-administered medications for this visit.   Facility-Administered Medications Ordered in Other Visits  Medication Dose Route Frequency Provider Last Rate Last Dose  . sodium chloride 0.9 % injection 10 mL  10 mL Intracatheter PRN Megan [name], Megan   10 mL at 12/30/13 1121    SURGICAL HISTORY:  Past Surgical History  Procedure Laterality Date  . Cholecystectomy    . Abdominal hysterectomy    . Dilation and curettage of uterus    . Abdominal surgery    . Tonsillectomy    . Esophagogastroduodenoscopy (egd) with propofol N/A 10/09/2014    Procedure: ESOPHAGOGASTRODUODENOSCOPY (EGD) WITH PROPOFOL;  Surgeon: Megan [name], Megan;  Location: [place];  Service: Endoscopy;  Laterality: N/A;  . Savory dilation N/A 10/09/2014    Procedure: SAVORY DILATION;  Surgeon: Megan [name], Megan;  Location: [place];  Service: Endoscopy;  Laterality:  N/A;  . Esophagogastroduodenoscopy N/A 12/12/2014    Procedure: ESOPHAGOGASTRODUODENOSCOPY (EGD);  Surgeon: Megan [name], Megan;  [name];  Service: Endoscopy;  Laterality: N/A;  with dilation  . Balloon dilation N/A 12/12/2014    Procedure: BALLOON DILATION;  Surgeon: Megan [name], Megan;  [facility];  Service: Endoscopy;  Laterality: N/A;  . Orif ankle fracture Right 07/04/2015    Procedure: OPEN REDUCTION INTERNAL FIXATION (ORIF) ANKLE FRACTURE;  Surgeon: Newt Minion, Megan;  Location: [place];  Service: Orthopedics;  Laterality: Right;  OPEN REDUCTION, INTERNAL FIXATION OF RIGHT ANKLE FRACTURE.     REVIEW OF SYSTEMS:  Constitutional: positive for fatigue Eyes: negative Ears, nose, mouth, throat, and face: negative Respiratory: positive for dyspnea on exertion Cardiovascular: negative Gastrointestinal: positive for diarrhea Genitourinary:negative Integument/breast: negative Hematologic/lymphatic: negative Musculoskeletal:positive for bone pain Neurological: negative Behavioral/Psych: negative Endocrine: negative Allergic/Immunologic: negative   PHYSICAL EXAMINATION: General appearance: alert, cooperative, fatigued and no distress Head: Normocephalic, without obvious abnormality, atraumatic Neck: no adenopathy, no JVD, supple, symmetrical, trachea midline and thyroid not enlarged, symmetric, no  tenderness/mass/nodules Lymph nodes: Cervical, supraclavicular, and axillary nodes normal. Resp: bilateral expiratory rhonchi, no wheezing or rales Back: symmetric, no curvature. ROM normal. No CVA tenderness. Cardio: regular rate and rhythm, S1, S2 normal, no murmur, click, rub or gallop GI: soft, non-tender; bowel sounds normal; no masses,  no organomegaly Extremities: extremities normal, atraumatic, no cyanosis or edema on the left. She is wearing a mobile cast on the right Neurologic: Alert and oriented X 3, normal strength and tone. Normal symmetric reflexes. Normal  coordination and gait  ECOG PERFORMANCE STATUS: 1 - Symptomatic but completely ambulatory  There were no vitals taken for this visit. CBC Latest Ref Rng 08/11/2015 08/04/2015 08/01/2015  WBC 3.9 - 10.3 10e3/uL 13.3(H) 7.6 5.6  Hemoglobin 11.6 - 15.9 g/dL 13.2 13.5 11.2(L)  Hematocrit 34.8 - 46.6 % 40.6 41.5 35.0(L)  Platelets 145 - 400 10e3/uL 376 262 178     CMP Latest Ref Rng 08/11/2015 08/04/2015 08/01/2015  Glucose 70 - 140 mg/dl 210(H) 542(H) 264(H)  BUN 7.0 - 26.0 mg/dL 9.7 15.5 15  Creatinine 0.6 - 1.1 mg/dL 0.7 1.1 0.70  Sodium 136 - 145 mEq/L 139 132(L) 134(L)  Potassium 3.5 - 5.1 mEq/L 4.9 4.6 3.7  Chloride 101 - 111 mmol/L - - 98(L)  CO2 22 - 29 mEq/L 22 16(L) 30  Calcium 8.4 - 10.4 mg/dL 8.7 9.1 8.7(L)  Total Protein 6.4 - 8.3 g/dL 5.4(L) 6.0(L) -  Total Bilirubin 0.20 - 1.20 mg/dL 0.36 0.63 -  Alkaline Phos 40 - 150 U/L 231(H) 117 -  AST 5 - 34 U/L 128(H) 12 -  ALT 0 - 55 U/L 53 17 -     RADIOGRAPHIC STUDIES: None  ASSESSMENT AND PLAN: This is a very pleasant 74 years old white female with  Metastatic non-small cell lung cancer, squamous cell carcinoma  Currently undergoing treatment with immunotherapy with Nivolumab status post 27 cycles and tolerating her treatment fairly well except for recently several episodes of diarrhea concerning to be immune mediated from her treatment.  C. difficile test was negative. Treatment was held and in the interim, she was placed on high dose of prednisone 1 mg/KG on daily basis Her diarrhea continues to be present, and she is still on her steroid therapy, 60 mg daily, which will not allow her to continue with her treatment for now, despite improvement of her liver functions and unremarkable CBC Will continue to monitor her closely with repeat blood work and visit in 2 weeks with Dr. Julien Nordmann.  At the time, hopefully her symptoms would have resolved and she will be able to resume immunotherapy. For the hyperglycemia, I also advised  her to discuss her diabetes medicine with her primary care physician. For the recent right ankle fracture, the patient will continue physical therapy at the nursing facility. She is also scheduled to see her urologist and pulmonologist this week for follow up She would come back for follow-up visit in one week for reevaluation before resuming her treatment. She is to receive her flu shot today as per her request The patient was advised to call immediately if she has any concerning symptoms in the interval. The patient voices understanding of current disease status and treatment options and is in agreement with the current care plan.   All questions were answered. The patient knows to call the clinic with any problems, questions or concerns. We can certainly see the patient much sooner if necessary.  ADDENDUM: Hematology/Oncology Attending: I had a face to face encounter with  the patient today. I recommended her care plan. This is a very pleasant 74 years old white female with metastatic non-small cell lung cancer, squamous cell carcinoma status post several chemotherapy regimen and most recently treated with immunotherapy with Nivolumab status post 27 cycles. Her treatment has been on hold because of persistent diarrhea up to 10 times a day. She is currently on treatment with high-dose prednisone 1 mg/kilogram on daily basis for the last few weeks. She has some improvement in her diarrhea but not complete resolution. I recommended for the patient to continue her current treatment with prednisone for 2 more weeks. This will be tapered slowly once her diarrhea is improved. She also had liver dysfunction and is significantly improved compared to 2 weeks ago. We will not resume her immunotherapy until resolution of her diarrhea to grade 1 or less. The patient agreed to the current plan. She was advised to call immediately if she has any concerning symptoms in the interval.  Disclaimer: This note was  dictated with voice recognition software. Similar sounding words can inadvertently be transcribed and may be missed upon review. Eilleen Kempf., Megan 08/25/2015

## 2015-08-25 NOTE — Telephone Encounter (Signed)
Gave and printed appt sched and avs fo rpt; for NOV  °

## 2015-08-26 LAB — URINE CULTURE

## 2015-09-08 ENCOUNTER — Ambulatory Visit (HOSPITAL_BASED_OUTPATIENT_CLINIC_OR_DEPARTMENT_OTHER): Payer: Medicare Other | Admitting: Internal Medicine

## 2015-09-08 ENCOUNTER — Telehealth: Payer: Self-pay | Admitting: Internal Medicine

## 2015-09-08 ENCOUNTER — Other Ambulatory Visit (HOSPITAL_BASED_OUTPATIENT_CLINIC_OR_DEPARTMENT_OTHER): Payer: Medicare Other

## 2015-09-08 ENCOUNTER — Encounter: Payer: Self-pay | Admitting: Internal Medicine

## 2015-09-08 ENCOUNTER — Ambulatory Visit: Payer: Medicare Other

## 2015-09-08 VITALS — BP 136/81 | HR 99 | Temp 98.4°F | Resp 18 | Ht 62.0 in | Wt 132.3 lb

## 2015-09-08 DIAGNOSIS — R197 Diarrhea, unspecified: Secondary | ICD-10-CM | POA: Diagnosis not present

## 2015-09-08 DIAGNOSIS — E1165 Type 2 diabetes mellitus with hyperglycemia: Secondary | ICD-10-CM | POA: Diagnosis not present

## 2015-09-08 DIAGNOSIS — C342 Malignant neoplasm of middle lobe, bronchus or lung: Secondary | ICD-10-CM

## 2015-09-08 DIAGNOSIS — C3491 Malignant neoplasm of unspecified part of right bronchus or lung: Secondary | ICD-10-CM

## 2015-09-08 DIAGNOSIS — C3490 Malignant neoplasm of unspecified part of unspecified bronchus or lung: Secondary | ICD-10-CM

## 2015-09-08 LAB — CBC WITH DIFFERENTIAL/PLATELET
BASO%: 0.1 % (ref 0.0–2.0)
Basophils Absolute: 0 10*3/uL (ref 0.0–0.1)
EOS ABS: 0 10*3/uL (ref 0.0–0.5)
EOS%: 0.3 % (ref 0.0–7.0)
HCT: 42.2 % (ref 34.8–46.6)
HGB: 13.7 g/dL (ref 11.6–15.9)
LYMPH%: 13.2 % — ABNORMAL LOW (ref 14.0–49.7)
MCH: 30.4 pg (ref 25.1–34.0)
MCHC: 32.5 g/dL (ref 31.5–36.0)
MCV: 93.5 fL (ref 79.5–101.0)
MONO#: 0.6 10*3/uL (ref 0.1–0.9)
MONO%: 6.6 % (ref 0.0–14.0)
NEUT#: 7.8 10*3/uL — ABNORMAL HIGH (ref 1.5–6.5)
NEUT%: 79.8 % — AB (ref 38.4–76.8)
Platelets: 186 10*3/uL (ref 145–400)
RBC: 4.51 10*6/uL (ref 3.70–5.45)
RDW: 15.2 % — ABNORMAL HIGH (ref 11.2–14.5)
WBC: 9.7 10*3/uL (ref 3.9–10.3)
lymph#: 1.3 10*3/uL (ref 0.9–3.3)

## 2015-09-08 LAB — TECHNOLOGIST REVIEW

## 2015-09-08 LAB — COMPREHENSIVE METABOLIC PANEL (CC13)
ALT: 17 U/L (ref 0–55)
AST: 13 U/L (ref 5–34)
Albumin: 3 g/dL — ABNORMAL LOW (ref 3.5–5.0)
Alkaline Phosphatase: 162 U/L — ABNORMAL HIGH (ref 40–150)
Anion Gap: 9 mEq/L (ref 3–11)
BUN: 21 mg/dL (ref 7.0–26.0)
CALCIUM: 8.9 mg/dL (ref 8.4–10.4)
CHLORIDE: 102 meq/L (ref 98–109)
CO2: 21 mEq/L — ABNORMAL LOW (ref 22–29)
Creatinine: 1 mg/dL (ref 0.6–1.1)
EGFR: 55 mL/min/{1.73_m2} — ABNORMAL LOW (ref >90)
GLUCOSE: 548 mg/dL — AB (ref 70–140)
POTASSIUM: 4.4 meq/L (ref 3.5–5.1)
SODIUM: 132 meq/L — AB (ref 136–145)
Total Bilirubin: 0.57 mg/dL (ref 0.20–1.20)
Total Protein: 5.3 g/dL — ABNORMAL LOW (ref 6.4–8.3)

## 2015-09-08 LAB — TSH CHCC: TSH: 3.684 m[IU]/L (ref 0.308–3.960)

## 2015-09-08 MED ORDER — INSULIN REGULAR HUMAN 100 UNIT/ML IJ SOLN
20.0000 [IU] | Freq: Once | INTRAMUSCULAR | Status: AC
  Administered 2015-09-08: 20 [IU] via SUBCUTANEOUS
  Filled 2015-09-08: qty 0.2

## 2015-09-08 NOTE — Telephone Encounter (Signed)
Gave and printed appt sched and avs fo rpt for NOV adn DEc

## 2015-09-08 NOTE — Progress Notes (Signed)
VO Regular  Insulin 20 units sq to be given now. Pt to recheck sugar at home and follow up with PCP for additional instructions regarding her sugar. Pt will not be treated today 09/08/15

## 2015-09-08 NOTE — Progress Notes (Signed)
[facility] [phone]   [phone]  OFFICE PROGRESS NOTE  Megan Bolt, MD [address] [place] [address] [place] [address]  DIAGNOSIS: Squamous cell lung cancer  Primary site: Lung (Right)  Staging method: AJCC 7th Edition  Clinical: Stage IIIB (T3, N3, M0)  Summary: Stage IIIB (T3, N3, M0)   PRIOR THERAPY:  1) Concurrent chemoradiation with weekly chemotherapy in the form of carboplatin for AUC of 2 and paclitaxel 45 mg/m2. Status post 7 week of treatment. Last dose was given 01/27/2014 no significant response in her disease.  2) Consolidation chemotherapy with carboplatin for AUC of 5 and paclitaxel 175 mg/M2 every 3 weeks with Neulasta support. First dose on 04/15/2014. Status post 3 cycles. Carboplatin was discontinued starting cycle #2 secondary to hypersensitivity reaction.  CURRENT THERAPY: Immunotherapy with Opdivo (Nivolumab) '3mg'$ /kg given every 2 weeks. Status post 27 cycles.  CHEMOTHERAPY INTENT: Control/Palliative  CURRENT # OF CHEMOTHERAPY CYCLES: 28 CURRENT ANTIEMETICS: Zofran, dexamethasone, Compazine  CURRENT SMOKING STATUS: Former smoker, quit 12/05/1997  ORAL CHEMOTHERAPY AND CONSENT: n/a  CURRENT BISPHOSPHONATES USE: None  PAIN MANAGEMENT: no cancer related pain, patient does have RA  NARCOTICS INDUCED CONSTIPATION: none  LIVING WILL AND CODE STATUS: ?  INTERVAL HISTORY: Megan [name] 74 y.o. female returns to the clinic today for follow-up visit. The patient is currently on immunotherapy with Nivolumab status post 27 cycles. The patient has been off treatment for the last 6 weeks secondary to significant diarrhea and she has been on high-dose prednisone 60 mg by mouth daily since that time. Her diarrhea has significantly improved over the last week. The patient forgot to take her insulin earlier today and her blood sugar has been elevated. She denied having any significant weight loss or night  sweats. The patient denied having any chest pain, shortness of breath, cough or hemoptysis. She has no significant nausea or vomiting, no fever or chills. She is here for reevaluation before resuming her treatment with immunotherapy.  MEDICAL HISTORY: Past Medical History  Diagnosis Date  . Hypertension   . Rheumatoid arthritis ([place])   . GERD (gastroesophageal reflux disease)     no meds for  . Hx of radiation therapy 12/16/13-01/30/14    lung 66Gy  . Neuropathy ([place])   . Shortness of breath dyspnea     with exertion  . History of hiatal hernia   . Diabetes mellitus     insulin  . Cancer ([place]) dx'd 09/2013    Lung ca    ALLERGIES:  is allergic to remicade; carboplatin; shellfish-derived products; and hydromorphone.  MEDICATIONS:  Current Outpatient Prescriptions  Medication Sig Dispense Refill  . B-D ULTRAFINE III SHORT PEN 31G X 8 MM MISC   0  . dronabinol (MARINOL) 10 MG capsule     . DULoxetine (CYMBALTA) 60 MG capsule Take 1 capsule (60 mg total) by mouth 2 (two) times daily. 60 capsule 11  . fluticasone (CUTIVATE) 0.05 % cream Apply 1 application topically 2 (two) times daily as needed (rash).     . Fluticasone-Salmeterol (ADVAIR DISKUS) 100-50 MCG/DOSE AEPB Inhale 1 puff into the lungs 2 (two) times daily. 60 each 5  . gabapentin (NEURONTIN) 600 MG tablet     . Gabapentin, Once-Daily, (GRALISE) 600 MG TABS Take 1,800 mg by mouth daily. 90 tablet 11  . HYDROcodone-acetaminophen (NORCO/VICODIN) 5-325 MG tablet Take 1-2 tablets by mouth every 6 (six) hours as needed. 10 tablet 0  . ibuprofen (ADVIL,MOTRIN) 600 MG  tablet Take 1 tablet (600 mg total) by mouth every 6 (six) hours as needed. Take with food. (Patient taking differently: Take 600 mg by mouth every 6 (six) hours as needed (pain). Take with food.) 30 tablet 0  . insulin detemir (LEVEMIR) 100 UNIT/ML injection Inject 28 Units into the skin at bedtime.    . insulin lispro (HUMALOG) 100 UNIT/ML injection Inject 6 Units into  the skin 4 (four) times daily - after meals and at bedtime.    [name] loperamide (IMODIUM A-D) 2 MG tablet Take 2 mg by mouth 4 (four) times daily as needed for diarrhea or loose stools.    [name] losartan-hydrochlorothiazide (HYZAAR) 100-25 MG tablet     . ONETOUCH DELICA LANCETS FINE MISC   0  . pantoprazole (PROTONIX) 40 MG tablet TAKE 1 TABLET BY MOUTH DAILY 30 tablet 0  . potassium chloride SA (K-DUR,KLOR-CON) 20 MEQ tablet Take 40 mEq by mouth daily.    . predniSONE (DELTASONE) 20 MG tablet Take 3 tablets (60 mg total) by mouth daily with breakfast. 90 tablet 0  . Probiotic Product (PROBIOTIC DAILY PO) Take 1 tablet by mouth daily.    [name] sulfaSALAzine (AZULFIDINE) 500 MG EC tablet TK 2 TS PO BID  2  . venlafaxine (EFFEXOR) 37.5 MG tablet      No current facility-administered medications for this visit.   Facility-Administered Medications Ordered in Other Visits  Medication Dose Route Frequency Provider Last Rate Last Dose  . sodium chloride 0.9 % injection 10 mL  10 mL Intracatheter PRN Curt Bears, MD   10 mL at 12/30/13 1121    SURGICAL HISTORY:  Past Surgical History  Procedure Laterality Date  . Cholecystectomy    . Abdominal hysterectomy    . Dilation and curettage of uterus    . Abdominal surgery    . Tonsillectomy    . Esophagogastroduodenoscopy (egd) with propofol N/A 10/09/2014    Procedure: ESOPHAGOGASTRODUODENOSCOPY (EGD) WITH PROPOFOL;  Surgeon: Inda Castle, MD;  Location: [facility];  Service: Endoscopy;  Laterality: N/A;  . Savory dilation N/A 10/09/2014    Procedure: SAVORY DILATION;  Surgeon: Inda Castle, MD;  Location: [facility];  Service: Endoscopy;  Laterality: N/A;  . Esophagogastroduodenoscopy N/A 12/12/2014    Procedure: ESOPHAGOGASTRODUODENOSCOPY (EGD);  Surgeon: Inda Castle, MD;  Location: [facility];  Service: Endoscopy;  Laterality: N/A;  with dilation  . Balloon dilation N/A 12/12/2014    Procedure: BALLOON DILATION;  Surgeon: Inda Castle, MD;  Location: [facility];  Service: Endoscopy;  Laterality: N/A;  . Orif ankle fracture Right 07/04/2015    Procedure: OPEN REDUCTION INTERNAL FIXATION (ORIF) ANKLE FRACTURE;  Surgeon: Newt Minion, MD;  Location: [facility];  Service: Orthopedics;  Laterality: Right;  OPEN REDUCTION, INTERNAL FIXATION OF RIGHT ANKLE FRACTURE.     REVIEW OF SYSTEMS:  Constitutional: positive for fatigue Eyes: negative Ears, nose, mouth, throat, and face: negative Respiratory: positive for dyspnea on exertion Cardiovascular: negative Gastrointestinal: positive for diarrhea Genitourinary:negative Integument/breast: negative Hematologic/lymphatic: negative Musculoskeletal:positive for bone pain Neurological: negative Behavioral/Psych: negative Endocrine: negative Allergic/Immunologic: negative   PHYSICAL EXAMINATION: General appearance: alert, cooperative, fatigued and no distress Head: Normocephalic, without obvious abnormality, atraumatic Neck: no adenopathy, no JVD, supple, symmetrical, trachea midline and thyroid not enlarged, symmetric, no tenderness/mass/nodules Lymph nodes: Cervical, supraclavicular, and axillary nodes normal. Resp: clear to auscultation bilaterally Back: symmetric, no curvature. ROM normal. No CVA tenderness. Cardio: regular rate and rhythm, S1, S2 normal, no murmur, click, rub or  gallop GI: soft, non-tender; bowel sounds normal; no masses,  no organomegaly Extremities: extremities normal, atraumatic, no cyanosis or edema Neurologic: Alert and oriented X 3, normal strength and tone. Normal symmetric reflexes. Normal coordination and gait  ECOG PERFORMANCE STATUS: 1 - Symptomatic but completely ambulatory  Blood pressure 136/81, pulse 99, temperature 98.4 F (36.9 C), temperature source Oral, resp. rate 18, height '5\' 2"'$  (1.575 m), weight 132 lb 4.8 oz (60.011 kg), SpO2 96 %.  LABORATORY DATA: Lab Results  Component Value Date   WBC 9.7 09/08/2015   HGB 13.7 09/08/2015    HCT 42.2 09/08/2015   MCV 93.5 09/08/2015   PLT 186 09/08/2015      Chemistry      Component Value Date/Time   NA 132* 09/08/2015 0958   NA 134* 08/01/2015 2021   K 4.4 09/08/2015 0958   K 3.7 08/01/2015 2021   CL 98* 08/01/2015 2021   CO2 21* 09/08/2015 0958   CO2 30 08/01/2015 2021   BUN 21.0 09/08/2015 0958   BUN 15 08/01/2015 2021   CREATININE 1.0 09/08/2015 0958   CREATININE 0.70 08/01/2015 2021   GLU 299* 02/02/2015 1348      Component Value Date/Time   CALCIUM 8.9 09/08/2015 0958   CALCIUM 8.7* 08/01/2015 2021   ALKPHOS 162* 09/08/2015 0958   ALKPHOS 87 07/04/2015 0649   AST 13 09/08/2015 0958   AST 17 07/04/2015 0649   ALT 17 09/08/2015 0958   ALT 12* 07/04/2015 0649   BILITOT 0.57 09/08/2015 0958   BILITOT 0.3 07/04/2015 0649       RADIOGRAPHIC STUDIES: No results found.  ASSESSMENT AND PLAN: This is a very pleasant 74 years old white female with metastatic non-small cell lung cancer, squamous cell carcinoma who is currently undergoing treatment with immunotherapy with Nivolumab status post 27 cycles and tolerating her treatment fairly well except for recently several episodes of diarrhea concerning to be immune mediated from her treatment.  She has been on high-dose prednisone 60 mg by mouth daily for the last few weeks and her diarrhea has significantly improved last week. The patient is still on high-dose prednisone. I recommended for her to start tapering her prednisone dose slowly by taking 40 mg by mouth daily for 1 week followed by 20 mg by mouth daily for 1 week. I will see her back for follow-up visit in 2 weeks for reevaluation and more reduction of her prednisone dose before resuming her treatment with immunotherapy with Nivolumab. For the hyperglycemia, the patient would receive regular insulin 20 units in the clinic today and the patient was advised to monitor her blood glucose level closely at home and to reconsult with her primary care physician if  still elevated. For the recent right ankle fracture, the patient will continue physical therapy at home. The patient was advised to call immediately if she has any concerning symptoms in the interval. The patient voices understanding of current disease status and treatment options and is in agreement with the current care plan.  All questions were answered. The patient knows to call the clinic with any problems, questions or concerns. We can certainly see the patient much sooner if necessary.  Disclaimer: This note was dictated with voice recognition software. Similar sounding words can inadvertently be transcribed and may not be corrected upon review.

## 2015-09-14 ENCOUNTER — Other Ambulatory Visit: Payer: Self-pay | Admitting: Medical Oncology

## 2015-09-15 ENCOUNTER — Ambulatory Visit (HOSPITAL_COMMUNITY)
Admission: RE | Admit: 2015-09-15 | Discharge: 2015-09-15 | Disposition: A | Discharge status: Home / Self Care | Payer: Medicare Other | Source: Ambulatory Visit | Attending: Internal Medicine | Admitting: Internal Medicine

## 2015-09-15 DIAGNOSIS — C342 Malignant neoplasm of middle lobe, bronchus or lung: Secondary | ICD-10-CM | POA: Insufficient documentation

## 2015-09-15 DIAGNOSIS — J9811 Atelectasis: Secondary | ICD-10-CM | POA: Insufficient documentation

## 2015-09-15 MED ORDER — IOHEXOL 300 MG/ML  SOLN
75.0000 mL | Freq: Once | INTRAMUSCULAR | Status: AC | PRN
  Administered 2015-09-15: 75 mL via INTRAVENOUS

## 2015-09-16 ENCOUNTER — Telehealth: Payer: Self-pay | Admitting: *Deleted

## 2015-09-16 NOTE — Telephone Encounter (Signed)
VM message from patient regarding her prednisone taper. TC returned to patient  And she wanted clarification her her prednisone dosage. Per Dr. Worthy Flank note, pt should now be on 20 mg of prednisone daily and stay on that until her next appt with him which is on 09/22/15. At that time he will decide next adjustments in dosing of prednisone. Pt voiced understanding.

## 2015-09-22 ENCOUNTER — Telehealth: Payer: Self-pay | Admitting: Internal Medicine

## 2015-09-22 ENCOUNTER — Ambulatory Visit (HOSPITAL_BASED_OUTPATIENT_CLINIC_OR_DEPARTMENT_OTHER): Payer: Medicare Other

## 2015-09-22 ENCOUNTER — Encounter: Payer: Self-pay | Admitting: Physician Assistant

## 2015-09-22 ENCOUNTER — Other Ambulatory Visit (HOSPITAL_BASED_OUTPATIENT_CLINIC_OR_DEPARTMENT_OTHER): Payer: Medicare Other

## 2015-09-22 ENCOUNTER — Ambulatory Visit (HOSPITAL_BASED_OUTPATIENT_CLINIC_OR_DEPARTMENT_OTHER): Payer: Medicare Other | Admitting: Physician Assistant

## 2015-09-22 VITALS — BP 140/77 | HR 100 | Temp 98.2°F | Resp 18 | Ht 62.0 in | Wt 140.1 lb

## 2015-09-22 DIAGNOSIS — Z5112 Encounter for antineoplastic immunotherapy: Secondary | ICD-10-CM | POA: Diagnosis not present

## 2015-09-22 DIAGNOSIS — C3491 Malignant neoplasm of unspecified part of right bronchus or lung: Secondary | ICD-10-CM

## 2015-09-22 DIAGNOSIS — C342 Malignant neoplasm of middle lobe, bronchus or lung: Secondary | ICD-10-CM

## 2015-09-22 LAB — COMPREHENSIVE METABOLIC PANEL (CC13)
ALBUMIN: 3.1 g/dL — AB (ref 3.5–5.0)
ALK PHOS: 125 U/L (ref 40–150)
ALT: 20 U/L (ref 0–55)
ANION GAP: 9 meq/L (ref 3–11)
AST: 12 U/L (ref 5–34)
BILIRUBIN TOTAL: 0.3 mg/dL (ref 0.20–1.20)
BUN: 20.9 mg/dL (ref 7.0–26.0)
CALCIUM: 8.6 mg/dL (ref 8.4–10.4)
CO2: 22 mEq/L (ref 22–29)
CREATININE: 0.7 mg/dL (ref 0.6–1.1)
Chloride: 106 mEq/L (ref 98–109)
EGFR: 80 mL/min/{1.73_m2} — ABNORMAL LOW (ref >90)
Glucose: 231 mg/dl — ABNORMAL HIGH (ref 70–140)
Potassium: 3.7 mEq/L (ref 3.5–5.1)
Sodium: 137 mEq/L (ref 136–145)
Total Protein: 5.4 g/dL — ABNORMAL LOW (ref 6.4–8.3)

## 2015-09-22 LAB — CBC WITH DIFFERENTIAL/PLATELET
BASO%: 1 % (ref 0.0–2.0)
BASOS ABS: 0.1 10*3/uL (ref 0.0–0.1)
EOS%: 0.4 % (ref 0.0–7.0)
Eosinophils Absolute: 0 10*3/uL (ref 0.0–0.5)
HEMATOCRIT: 41.7 % (ref 34.8–46.6)
HEMOGLOBIN: 13.8 g/dL (ref 11.6–15.9)
LYMPH#: 1.4 10*3/uL (ref 0.9–3.3)
LYMPH%: 17.3 % (ref 14.0–49.7)
MCH: 30 pg (ref 25.1–34.0)
MCHC: 33 g/dL (ref 31.5–36.0)
MCV: 90.9 fL (ref 79.5–101.0)
MONO#: 0.6 10*3/uL (ref 0.1–0.9)
MONO%: 7.4 % (ref 0.0–14.0)
NEUT#: 6.1 10*3/uL (ref 1.5–6.5)
NEUT%: 73.9 % (ref 38.4–76.8)
PLATELETS: 164 10*3/uL (ref 145–400)
RBC: 4.59 10*6/uL (ref 3.70–5.45)
RDW: 14.9 % — AB (ref 11.2–14.5)
WBC: 8.2 10*3/uL (ref 3.9–10.3)

## 2015-09-22 LAB — TECHNOLOGIST REVIEW

## 2015-09-22 MED ORDER — SODIUM CHLORIDE 0.9 % IV SOLN
Freq: Once | INTRAVENOUS | Status: AC
  Administered 2015-09-22: 12:00:00 via INTRAVENOUS

## 2015-09-22 MED ORDER — SODIUM CHLORIDE 0.9 % IV SOLN
3.0000 mg/kg | Freq: Once | INTRAVENOUS | Status: AC
  Administered 2015-09-22: 180 mg via INTRAVENOUS
  Filled 2015-09-22: qty 14

## 2015-09-22 NOTE — Patient Instructions (Signed)
[facility] Discharge Instructions for Patients Receiving Chemotherapy  Today you received the following chemotherapy agents:  Nivolumab.  To help prevent nausea and vomiting after your treatment, we encourage you to take your nausea medication as directed.   If you develop nausea and vomiting that is not controlled by your nausea medication, call the clinic.   BELOW ARE SYMPTOMS THAT SHOULD BE REPORTED IMMEDIATELY:  *FEVER GREATER THAN 100.5 F  *CHILLS WITH OR WITHOUT FEVER  NAUSEA AND VOMITING THAT IS NOT CONTROLLED WITH YOUR NAUSEA MEDICATION  *UNUSUAL SHORTNESS OF BREATH  *UNUSUAL BRUISING OR BLEEDING  TENDERNESS IN MOUTH AND THROAT WITH OR WITHOUT PRESENCE OF ULCERS  *URINARY PROBLEMS  *BOWEL PROBLEMS  UNUSUAL RASH Items with * indicate a potential emergency and should be followed up as soon as possible.  Feel free to call the clinic you have any questions or concerns. The clinic phone number is [phone].  Please show the CHEMO ALERT CARD at check-in to the Emergency Department and triage nurse.   

## 2015-09-22 NOTE — Progress Notes (Signed)
[name] [phone]   [phone]  OFFICE PROGRESS NOTE  [name], MD [address] [place] [place] [address]  DIAGNOSIS: Squamous cell lung cancer  Primary site: Lung (Right)  Staging method: AJCC 7th Edition  Clinical: Stage IIIB (T3, N3, M0)  Summary: Stage IIIB (T3, N3, M0)   PRIOR THERAPY:  1) Concurrent chemoradiation with weekly chemotherapy in the form of carboplatin for AUC of 2 and paclitaxel 45 mg/m2. Status post 7 week of treatment. Last dose was given 01/27/2014 no significant response in her disease.  2) Consolidation chemotherapy with carboplatin for AUC of 5 and paclitaxel 175 mg/M2 every 3 weeks with Neulasta support. First dose on 04/15/2014. Status post 3 cycles. Carboplatin was discontinued starting cycle #2 secondary to hypersensitivity reaction.  CURRENT THERAPY: Immunotherapy with Opdivo (Nivolumab) '3mg'$ /kg given every 2 weeks. Status post 27 cycles.  CHEMOTHERAPY INTENT: Control/Palliative  CURRENT # OF CHEMOTHERAPY CYCLES: 29 CURRENT ANTIEMETICS: Zofran, dexamethasone, Compazine  CURRENT SMOKING STATUS: Former smoker, quit 12/05/1997  ORAL CHEMOTHERAPY AND CONSENT: n/a  CURRENT BISPHOSPHONATES USE: None  PAIN MANAGEMENT: no cancer related pain, patient does have RA  NARCOTICS INDUCED CONSTIPATION: none  LIVING WILL AND CODE STATUS: ?  INTERVAL HISTORY: Megan [name] 74 y.o. female returns to the clinic today for follow-up visit. The patient is currently on immunotherapy with Nivolumab status post 27 cycles. The patient has been off treatment for the last 6 weeks secondary to significant diarrhea and she has been initially on high-dose prednisone 60 mg by mouth daily, currently at 20 mg daily with resolution of diarrhea and improvement of her appetite. She has gained 9 lbs since her last visit.  She denied having any significant night sweats. The patient denied having any  chest pain, shortness of breath, cough or hemoptysis. She has no significant nausea or vomiting, no fever or chills. She is recovering form a UTI diagnosed 1 week ago, for which she has been taking Keflex, due to finish her therapy over the next few days.She is here for reevaluation before resuming her treatment with immunotherapy.  MEDICAL HISTORY: Past Medical History  Diagnosis Date  . Hypertension   . Rheumatoid arthritis ([place])   . GERD (gastroesophageal reflux disease)     no meds for  . Hx of radiation therapy 12/16/13-01/30/14    lung 66Gy  . Neuropathy ([place])   . Shortness of breath dyspnea     with exertion  . History of hiatal hernia   . Diabetes mellitus     insulin  . Cancer ([facility]) dx'd 09/2013    Lung ca    ALLERGIES:  is allergic to remicade; carboplatin; shellfish-derived products; and hydromorphone.  MEDICATIONS:  Current Outpatient Prescriptions  Medication Sig Dispense Refill  . cephALEXin (KEFLEX) 500 MG capsule Take 500 mg by mouth 2 (two) times daily.    . B-D ULTRAFINE III SHORT PEN 31G X 8 MM MISC   0  . dronabinol (MARINOL) 10 MG capsule     . DULoxetine (CYMBALTA) 60 MG capsule Take 1 capsule (60 mg total) by mouth 2 (two) times daily. 60 capsule 11  . fluticasone (CUTIVATE) 0.05 % cream Apply 1 application topically 2 (two) times daily as needed (rash).     . Fluticasone-Salmeterol (ADVAIR DISKUS) 100-50 MCG/DOSE AEPB Inhale 1 puff into the lungs 2 (two) times daily. 60 each 5  . gabapentin (NEURONTIN) 600 MG tablet     . Gabapentin, Once-Daily, (GRALISE) 600  MG TABS Take 1,800 mg by mouth daily. 90 tablet 11  . HYDROcodone-acetaminophen (NORCO/VICODIN) 5-325 MG tablet Take 1-2 tablets by mouth every 6 (six) hours as needed. 10 tablet 0  . ibuprofen (ADVIL,MOTRIN) 600 MG tablet Take 1 tablet (600 mg total) by mouth every 6 (six) hours as needed. Take with food. (Patient taking differently: Take 600 mg by mouth every 6 (six) hours as needed (pain). Take with  food.) 30 tablet 0  . insulin detemir (LEVEMIR) 100 UNIT/ML injection Inject 28 Units into the skin at bedtime.    . insulin lispro (HUMALOG) 100 UNIT/ML injection Inject 6 Units into the skin 4 (four) times daily - after meals and at bedtime.    [name] loperamide (IMODIUM A-D) 2 MG tablet Take 2 mg by mouth 4 (four) times daily as needed for diarrhea or loose stools.    [name] losartan-hydrochlorothiazide (HYZAAR) 100-25 MG tablet     . ONETOUCH DELICA LANCETS FINE MISC   0  . pantoprazole (PROTONIX) 40 MG tablet TAKE 1 TABLET BY MOUTH DAILY 30 tablet 0  . potassium chloride SA (K-DUR,KLOR-CON) 20 MEQ tablet Take 40 mEq by mouth daily.    . predniSONE (DELTASONE) 20 MG tablet Take 3 tablets (60 mg total) by mouth daily with breakfast. (Patient taking differently: Take 10 mg by mouth daily with breakfast. ) 90 tablet 0  . Probiotic Product (PROBIOTIC DAILY PO) Take 1 tablet by mouth daily.    [name] sulfaSALAzine (AZULFIDINE) 500 MG EC tablet TK 2 TS PO BID  2  . venlafaxine (EFFEXOR) 37.5 MG tablet      No current facility-administered medications for this visit.   Facility-Administered Medications Ordered in Other Visits  Medication Dose Route Frequency Provider Last Rate Last Dose  . nivolumab (OPDIVO) 180 mg in sodium chloride 0.9 % 100 mL chemo infusion  3 mg/kg (Order-Specific) Intravenous Once Curt Bears, MD      . sodium chloride 0.9 % injection 10 mL  10 mL Intracatheter PRN Curt Bears, MD   10 mL at 12/30/13 1121    SURGICAL HISTORY:  Past Surgical History  Procedure Laterality Date  . Cholecystectomy    . Abdominal hysterectomy    . Dilation and curettage of uterus    . Abdominal surgery    . Tonsillectomy    . Esophagogastroduodenoscopy (egd) with propofol N/A 10/09/2014    Procedure: ESOPHAGOGASTRODUODENOSCOPY (EGD) WITH PROPOFOL;  Surgeon: Inda Castle, MD;  Location: [facility];  Service: Endoscopy;  Laterality: N/A;  . Savory dilation N/A 10/09/2014    Procedure:  SAVORY DILATION;  Surgeon: Inda Castle, MD;  Location: [facility];  Service: Endoscopy;  Laterality: N/A;  . Esophagogastroduodenoscopy N/A 12/12/2014    Procedure: ESOPHAGOGASTRODUODENOSCOPY (EGD);  Surgeon: Inda Castle, MD;  Location: [facility];  Service: Endoscopy;  Laterality: N/A;  with dilation  . Balloon dilation N/A 12/12/2014    Procedure: BALLOON DILATION;  Surgeon: Inda Castle, MD;  Location: [facility];  Service: Endoscopy;  Laterality: N/A;  . Orif ankle fracture Right 07/04/2015    Procedure: OPEN REDUCTION INTERNAL FIXATION (ORIF) ANKLE FRACTURE;  Surgeon: Newt Minion, MD;  Location: [facility];  Service: Orthopedics;  Laterality: Right;  OPEN REDUCTION, INTERNAL FIXATION OF RIGHT ANKLE FRACTURE.     REVIEW OF SYSTEMS:  Constitutional: positive for fatigue Eyes: negative Ears, nose, mouth, throat, and face: negative Respiratory: positive for dyspnea on exertion Cardiovascular: negative Gastrointestinal: negative for diarrhea Genitourinary:negative Integument/breast: negative Hematologic/lymphatic: negative Musculoskeletal:positive for bone  pain. Her right metatarsal fracture is improving. Neurological: negative Behavioral/Psych: negative Endocrine: negative Allergic/Immunologic: negative   PHYSICAL EXAMINATION: General appearance: alert, cooperative, fatigued and no distress Head: Normocephalic, without obvious abnormality, atraumatic Neck: no adenopathy, no JVD, supple, symmetrical, trachea midline and thyroid not enlarged, symmetric, no tenderness/mass/nodules Lymph nodes: Cervical, supraclavicular, and axillary nodes normal. Resp: clear to auscultation bilaterally Back: symmetric, no curvature. ROM normal. No CVA tenderness. Cardio: regular rate and rhythm, S1, S2 normal, no murmur, click, rub or gallop GI: soft, non-tender; bowel sounds normal; no masses,  no organomegaly Extremities: extremities normal, atraumatic, no cyanosis or edema Neurologic:  Alert and oriented X 3, normal strength and tone. Normal symmetric reflexes. Normal coordination and gait  ECOG PERFORMANCE STATUS: 1 - Symptomatic but completely ambulatory  Blood pressure 140/77, pulse 100, temperature 98.2 F (36.8 C), temperature source Oral, resp. rate 18, height '5\' 2"'$  (1.575 m), weight 140 lb 1.6 oz (63.549 kg), SpO2 97 %.  LABORATORY DATA: CBC Latest Ref Rng 09/22/2015 09/08/2015 08/25/2015  WBC 3.9 - 10.3 10e3/uL 8.2 9.7 7.5  Hemoglobin 11.6 - 15.9 g/dL 13.8 13.7 13.0  Hematocrit 34.8 - 46.6 % 41.7 42.2 39.6  Platelets 145 - 400 10e3/uL 164 186 250   CMP Latest Ref Rng 09/22/2015 09/08/2015 08/25/2015  Glucose 70 - 140 mg/dl 231(H) 548(H) 183(H)  BUN 7.0 - 26.0 mg/dL 20.9 21.0 14.0  Creatinine 0.6 - 1.1 mg/dL 0.7 1.0 0.8  Sodium 136 - 145 mEq/L 137 132(L) 140  Potassium 3.5 - 5.1 mEq/L 3.7 4.4 3.5  Chloride 101 - 111 mmol/L - - -  CO2 22 - 29 mEq/L 22 21(L) 25  Calcium 8.4 - 10.4 mg/dL 8.6 8.9 8.4  Total Protein 6.4 - 8.3 g/dL 5.4(L) 5.3(L) 5.0(L)  Total Bilirubin 0.20 - 1.20 mg/dL 0.30 0.57 0.33  Alkaline Phos 40 - 150 U/L 125 162(H) 111  AST 5 - 34 U/L '12 13 8  '$ ALT 0 - 55 U/L '20 17 11     '$ RADIOGRAPHIC STUDIES: Ct Chest W Contrast  09/15/2015  CLINICAL DATA:  Restaging of right middle lobe lung cancer. EXAM: CT CHEST WITH CONTRAST TECHNIQUE: Multidetector CT imaging of the chest was performed during intravenous contrast administration. CONTRAST:  95m OMNIPAQUE IOHEXOL 300 MG/ML  SOLN COMPARISON:  CT scan of June 29, 2015. FINDINGS: No pneumothorax or definite pleural effusion is noted stable calcified granuloma is noted laterally in the left lower lobe. Immediately inferior to this, stable 2 mm nodule is noted. Stable chronic atelectasis of the right upper and middle lobes is noted. Stable architectural distortion is noted in the hyperinflated right lower lobe most consistent with post radiation fibrosis. No new pulmonary parenchymal findings are noted.  There is no evidence of thoracic aortic dissection or aneurysm. No significant osseous abnormality is noted. 13 mm subcarinal lymph node noted on prior exam is not well visualized currently. No definite significant adenopathy is seen at this time. Mild coronary artery calcifications are noted. Stable low densities are noted in the liver which are not significantly changed and most consistent with benign hepatic cysts. No other significant abnormality seen in the visualized portion of the upper abdomen. IMPRESSION: Stable chronic atelectasis of the right upper and middle lobes. Stable probable post radiation fibrosis in the hyperinflated right lower lobe. No significant changes noted compared to prior exam. Electronically Signed   By: JMarijo Conception M.D.   On: 09/15/2015 09:58    ASSESSMENT AND PLAN: This is a very pleasant 74 years  old white female with metastatic non-small cell lung cancer, squamous cell carcinoma who is currently undergoing treatment with immunotherapy with Nivolumab status post 27 cycles and tolerating her treatment fairly well except for recently several episodes of diarrhea concerning to be immune mediated from her treatment.  She has been on high-dose prednisone initially at  60 mg by mouth daily for the last few weeks, now at 20 mg daily. Her diarrhea has resolved. Will decrease her daily dose to 10 mg . Her most recent CT on 09/15/2015 shows stable disease. Will see her back for follow-up visit in 2 weeks for reevaluation Will resume her treatment today, cycle 28 with immunotherapy with Nivolumab.  For the recent right ankle fracture, the patient will continue physical therapy at home. She has requested a copy of her CT report as well as her most recent CBC and CMET, which have printed for her after discussion with Dr. Julien Nordmann. The patient was advised to call immediately if she has any concerning symptoms in the interval. The patient voices understanding of current disease  status and treatment options and is in agreement with the current care plan.  All questions were answered. The patient knows to call the clinic with any problems, questions or concerns. We can certainly see the patient much sooner if necessary.  ADDENDUM: Hematology/Oncology Attending: I had a face to face encounter with the patient. I recommended her care plan. This is a very pleasant 74 years old white female with metastatic non-small cell lung cancer, squamous cell carcinoma status post 28 cycles of immunotherapy with Nivolumab. Her treatment has been on hold for the last 6 weeks or so secondary to grade 3 diarrhea requiring treatment with tapered dose of prednisone. Her diarrhea had significantly improved and her dose of prednisone has been tapered recently. She was started milligram of prednisone today for the next week then 5 mg for 1 week followed by 5 mg every other day before discontinuing her treatment. The patient is feeling much better today and she would like to resume her treatment with Nivolumab. She had a recent CT scan of the chest that showed stable disease with no significant progression. I discussed the scan results with the patient today. I recommended for her to resume her treatment with Nivolumab with cycle #29 today. She would come back for follow-up visit in 2 weeks for reevaluation before starting cycle #30. The patient was advised to call immediately if she has any concerning symptoms in the interval.  Disclaimer: This note was dictated with voice recognition software. Similar sounding words can inadvertently be transcribed and may be missed upon review. Eilleen Kempf., MD 09/22/2015

## 2015-09-22 NOTE — Telephone Encounter (Signed)
Gave and printed appts ched and avs for pt for DEC  °

## 2015-09-28 ENCOUNTER — Telehealth: Payer: Self-pay | Admitting: Internal Medicine

## 2015-09-28 NOTE — Telephone Encounter (Signed)
returned call and pt line busy.Marland KitchenMarland KitchenMarland Kitchen

## 2015-10-01 ENCOUNTER — Telehealth: Payer: Self-pay | Admitting: Internal Medicine

## 2015-10-01 NOTE — Telephone Encounter (Signed)
returned call and r/s 12.20 to 12.21 per pt request....pt ok and aware

## 2015-10-05 ENCOUNTER — Other Ambulatory Visit: Payer: Self-pay | Admitting: Medical Oncology

## 2015-10-05 DIAGNOSIS — C3491 Malignant neoplasm of unspecified part of right bronchus or lung: Secondary | ICD-10-CM

## 2015-10-06 ENCOUNTER — Encounter: Payer: Self-pay | Admitting: Internal Medicine

## 2015-10-06 ENCOUNTER — Ambulatory Visit (HOSPITAL_BASED_OUTPATIENT_CLINIC_OR_DEPARTMENT_OTHER): Payer: Medicare Other | Admitting: Internal Medicine

## 2015-10-06 ENCOUNTER — Other Ambulatory Visit (HOSPITAL_BASED_OUTPATIENT_CLINIC_OR_DEPARTMENT_OTHER): Payer: Medicare Other

## 2015-10-06 ENCOUNTER — Ambulatory Visit (HOSPITAL_BASED_OUTPATIENT_CLINIC_OR_DEPARTMENT_OTHER): Payer: Medicare Other

## 2015-10-06 VITALS — BP 164/96 | HR 98 | Temp 98.7°F | Resp 18 | Ht 62.0 in | Wt 145.0 lb

## 2015-10-06 DIAGNOSIS — R197 Diarrhea, unspecified: Secondary | ICD-10-CM

## 2015-10-06 DIAGNOSIS — E1165 Type 2 diabetes mellitus with hyperglycemia: Secondary | ICD-10-CM

## 2015-10-06 DIAGNOSIS — Z794 Long term (current) use of insulin: Secondary | ICD-10-CM

## 2015-10-06 DIAGNOSIS — G620 Drug-induced polyneuropathy: Secondary | ICD-10-CM

## 2015-10-06 DIAGNOSIS — C342 Malignant neoplasm of middle lobe, bronchus or lung: Secondary | ICD-10-CM | POA: Diagnosis not present

## 2015-10-06 DIAGNOSIS — C3491 Malignant neoplasm of unspecified part of right bronchus or lung: Secondary | ICD-10-CM

## 2015-10-06 DIAGNOSIS — Z5112 Encounter for antineoplastic immunotherapy: Secondary | ICD-10-CM

## 2015-10-06 DIAGNOSIS — C3411 Malignant neoplasm of upper lobe, right bronchus or lung: Secondary | ICD-10-CM

## 2015-10-06 DIAGNOSIS — T451X5A Adverse effect of antineoplastic and immunosuppressive drugs, initial encounter: Secondary | ICD-10-CM

## 2015-10-06 LAB — COMPREHENSIVE METABOLIC PANEL
ALBUMIN: 3.3 g/dL — AB (ref 3.5–5.0)
ALK PHOS: 154 U/L — AB (ref 40–150)
ALT: 17 U/L (ref 0–55)
ANION GAP: 10 meq/L (ref 3–11)
AST: 13 U/L (ref 5–34)
BUN: 14.8 mg/dL (ref 7.0–26.0)
CALCIUM: 8.8 mg/dL (ref 8.4–10.4)
CO2: 24 mEq/L (ref 22–29)
Chloride: 102 mEq/L (ref 98–109)
Creatinine: 0.9 mg/dL (ref 0.6–1.1)
EGFR: 65 mL/min/{1.73_m2} — AB (ref >90)
Glucose: 549 mg/dl — ABNORMAL HIGH (ref 70–140)
POTASSIUM: 4.2 meq/L (ref 3.5–5.1)
SODIUM: 135 meq/L — AB (ref 136–145)
Total Bilirubin: 0.48 mg/dL (ref 0.20–1.20)
Total Protein: 6 g/dL — ABNORMAL LOW (ref 6.4–8.3)

## 2015-10-06 LAB — CBC WITH DIFFERENTIAL/PLATELET
BASO%: 1.2 % (ref 0.0–2.0)
BASOS ABS: 0.1 10*3/uL (ref 0.0–0.1)
EOS ABS: 0 10*3/uL (ref 0.0–0.5)
EOS%: 0.3 % (ref 0.0–7.0)
HEMATOCRIT: 42.1 % (ref 34.8–46.6)
HEMOGLOBIN: 13.9 g/dL (ref 11.6–15.9)
LYMPH%: 16 % (ref 14.0–49.7)
MCH: 29.7 pg (ref 25.1–34.0)
MCHC: 33 g/dL (ref 31.5–36.0)
MCV: 90.2 fL (ref 79.5–101.0)
MONO#: 0.5 10*3/uL (ref 0.1–0.9)
MONO%: 6.6 % (ref 0.0–14.0)
NEUT#: 5.4 10*3/uL (ref 1.5–6.5)
NEUT%: 75.9 % (ref 38.4–76.8)
PLATELETS: 196 10*3/uL (ref 145–400)
RBC: 4.67 10*6/uL (ref 3.70–5.45)
RDW: 15 % — AB (ref 11.2–14.5)
WBC: 7.1 10*3/uL (ref 3.9–10.3)
lymph#: 1.1 10*3/uL (ref 0.9–3.3)

## 2015-10-06 LAB — TECHNOLOGIST REVIEW

## 2015-10-06 MED ORDER — INSULIN REGULAR HUMAN 100 UNIT/ML IJ SOLN
20.0000 [IU] | Freq: Once | INTRAMUSCULAR | Status: AC
  Administered 2015-10-06: 20 [IU] via SUBCUTANEOUS
  Filled 2015-10-06: qty 0.2

## 2015-10-06 MED ORDER — SODIUM CHLORIDE 0.9 % IV SOLN
240.0000 mg | Freq: Once | INTRAVENOUS | Status: AC
  Administered 2015-10-06: 240 mg via INTRAVENOUS
  Filled 2015-10-06: qty 24

## 2015-10-06 MED ORDER — SODIUM CHLORIDE 0.9 % IV SOLN
Freq: Once | INTRAVENOUS | Status: AC
  Administered 2015-10-06: 13:00:00 via INTRAVENOUS

## 2015-10-06 NOTE — Progress Notes (Signed)
[place] [phone]   [phone]  OFFICE PROGRESS NOTE  [name], MD [address] [place] [address] [place] [address]  DIAGNOSIS: Squamous cell lung cancer  Primary site: Lung (Right)  Staging method: AJCC 7th Edition  Clinical: Stage IIIB (T3, N3, M0)  Summary: Stage IIIB (T3, N3, M0)   PRIOR THERAPY:  1) Concurrent chemoradiation with weekly chemotherapy in the form of carboplatin for AUC of 2 and paclitaxel 45 mg/m2. Status post 7 week of treatment. Last dose was given 01/27/2014 no significant response in her disease.  2) Consolidation chemotherapy with carboplatin for AUC of 5 and paclitaxel 175 mg/M2 every 3 weeks with Neulasta support. First dose on 04/15/2014. Status post 3 cycles. Carboplatin was discontinued starting cycle #2 secondary to hypersensitivity reaction.  CURRENT THERAPY: Immunotherapy with Opdivo (Nivolumab) '3mg'$ /kg given every 2 weeks. Status post 28 cycles.  CHEMOTHERAPY INTENT: Control/Palliative  CURRENT # OF CHEMOTHERAPY CYCLES: 29 CURRENT ANTIEMETICS: Zofran, dexamethasone, Compazine  CURRENT SMOKING STATUS: Former smoker, quit 12/05/1997  ORAL CHEMOTHERAPY AND CONSENT: n/a  CURRENT BISPHOSPHONATES USE: None  PAIN MANAGEMENT: no cancer related pain, patient does have RA  NARCOTICS INDUCED CONSTIPATION: none  LIVING WILL AND CODE STATUS: ?  INTERVAL HISTORY: Megan [name] 74 y.o. female returns to the clinic today for follow-up visit. The patient is currently on immunotherapy with Nivolumab status post 28 cycles. The patient related the last cycle of her immunotherapy fairly well with no significant complaints. She denied having any diarrhea. She has like 4-5 spots of rash on the arms and thigh. Her diabetes is not well-controlled. She continued to have significant hyperglycemia. She denied having any significant weight loss or night sweats. The patient denied having any chest  pain, shortness of breath, cough or hemoptysis. She has no significant nausea or vomiting, no fever or chills. She is here today to start cycle #29 of her treatment.  MEDICAL HISTORY: Past Medical History  Diagnosis Date  . Hypertension   . Rheumatoid arthritis ([place])   . GERD (gastroesophageal reflux disease)     no meds for  . Hx of radiation therapy 12/16/13-01/30/14    lung 66Gy  . Neuropathy ([place])   . Shortness of breath dyspnea     with exertion  . History of hiatal hernia   . Diabetes mellitus     insulin  . Cancer ([place]) dx'd 09/2013    Lung ca    ALLERGIES:  is allergic to remicade; carboplatin; shellfish-derived products; and hydromorphone.  MEDICATIONS:  Current Outpatient Prescriptions  Medication Sig Dispense Refill  . Fluticasone-Salmeterol (ADVAIR DISKUS) 100-50 MCG/DOSE AEPB Inhale 1 puff into the lungs 2 (two) times daily. 60 each 5  . gabapentin (NEURONTIN) 600 MG tablet 600 mg 3 (three) times daily.     [name] HYDROcodone-acetaminophen (NORCO/VICODIN) 5-325 MG tablet Take 1-2 tablets by mouth every 6 (six) hours as needed. 10 tablet 0  . ibuprofen (ADVIL,MOTRIN) 600 MG tablet Take 1 tablet (600 mg total) by mouth every 6 (six) hours as needed. Take with food. (Patient taking differently: Take 600 mg by mouth every 6 (six) hours as needed (pain). Take with food.) 30 tablet 0  . insulin detemir (LEVEMIR) 100 UNIT/ML injection Inject 28 Units into the skin at bedtime.    . insulin lispro (HUMALOG) 100 UNIT/ML injection Inject 6 Units into the skin 4 (four) times daily - after meals and at bedtime.    . mirabegron ER (MYRBETRIQ) 50 MG TB24  tablet Take 50 mg by mouth daily.    [name] DELICA LANCETS FINE MISC   0  . potassium chloride SA (K-DUR,KLOR-CON) 20 MEQ tablet Take 40 mEq by mouth daily.    . predniSONE (DELTASONE) 20 MG tablet Take 3 tablets (60 mg total) by mouth daily with breakfast. (Patient taking differently: Take 10 mg by mouth daily with breakfast. ) 90  tablet 0  . B-D ULTRAFINE III SHORT PEN 31G X 8 MM MISC   0  . DULoxetine (CYMBALTA) 60 MG capsule Take 1 capsule (60 mg total) by mouth 2 (two) times daily. 60 capsule 11  . fluticasone (CUTIVATE) 0.05 % cream Apply 1 application topically 2 (two) times daily as needed (rash).     [name] loperamide (IMODIUM A-D) 2 MG tablet Take 2 mg by mouth 4 (four) times daily as needed for diarrhea or loose stools.    . pantoprazole (PROTONIX) 40 MG tablet TAKE 1 TABLET BY MOUTH DAILY (Patient not taking: Reported on 10/06/2015) 30 tablet 0   No current facility-administered medications for this visit.   Facility-Administered Medications Ordered in Other Visits  Medication Dose Route Frequency Provider Last Rate Last Dose  . sodium chloride 0.9 % injection 10 mL  10 mL Intracatheter PRN Curt Bears, MD   10 mL at 12/30/13 1121    SURGICAL HISTORY:  Past Surgical History  Procedure Laterality Date  . Cholecystectomy    . Abdominal hysterectomy    . Dilation and curettage of uterus    . Abdominal surgery    . Tonsillectomy    . Esophagogastroduodenoscopy (egd) with propofol N/A 10/09/2014    Procedure: ESOPHAGOGASTRODUODENOSCOPY (EGD) WITH PROPOFOL;  Surgeon: Inda Castle, MD;  Location: [facility];  Service: Endoscopy;  Laterality: N/A;  . Savory dilation N/A 10/09/2014    Procedure: SAVORY DILATION;  Surgeon: Inda Castle, MD;  Location: [facility];  Service: Endoscopy;  Laterality: N/A;  . Esophagogastroduodenoscopy N/A 12/12/2014    Procedure: ESOPHAGOGASTRODUODENOSCOPY (EGD);  Surgeon: Inda Castle, MD;  Location: [facility];  Service: Endoscopy;  Laterality: N/A;  with dilation  . Balloon dilation N/A 12/12/2014    Procedure: BALLOON DILATION;  Surgeon: Inda Castle, MD;  Location: [facility];  Service: Endoscopy;  Laterality: N/A;  . Orif ankle fracture Right 07/04/2015    Procedure: OPEN REDUCTION INTERNAL FIXATION (ORIF) ANKLE FRACTURE;  Surgeon: Newt Minion, MD;  Location:  [facility];  Service: Orthopedics;  Laterality: Right;  OPEN REDUCTION, INTERNAL FIXATION OF RIGHT ANKLE FRACTURE.     REVIEW OF SYSTEMS:  Constitutional: positive for fatigue Eyes: negative Ears, nose, mouth, throat, and face: negative Respiratory: negative Cardiovascular: negative Gastrointestinal: negative Genitourinary:negative Integument/breast: negative Hematologic/lymphatic: negative Musculoskeletal:negative Neurological: negative Behavioral/Psych: negative Endocrine: negative Allergic/Immunologic: negative   PHYSICAL EXAMINATION: General appearance: alert, cooperative, fatigued and no distress Head: Normocephalic, without obvious abnormality, atraumatic Neck: no adenopathy, no JVD, supple, symmetrical, trachea midline and thyroid not enlarged, symmetric, no tenderness/mass/nodules Lymph nodes: Cervical, supraclavicular, and axillary nodes normal. Resp: clear to auscultation bilaterally Back: symmetric, no curvature. ROM normal. No CVA tenderness. Cardio: regular rate and rhythm, S1, S2 normal, no murmur, click, rub or gallop GI: soft, non-tender; bowel sounds normal; no masses,  no organomegaly Extremities: extremities normal, atraumatic, no cyanosis or edema Neurologic: Alert and oriented X 3, normal strength and tone. Normal symmetric reflexes. Normal coordination and gait  ECOG PERFORMANCE STATUS: 1 - Symptomatic but completely ambulatory  Blood pressure 164/96, pulse 98, temperature 98.7 F (37.1  C), temperature source Oral, resp. rate 18, height '5\' 2"'$  (1.575 m), weight 145 lb (65.772 kg), SpO2 98 %.  LABORATORY DATA: Lab Results  Component Value Date   WBC 7.1 10/06/2015   HGB 13.9 10/06/2015   HCT 42.1 10/06/2015   MCV 90.2 10/06/2015   PLT 196 10/06/2015      Chemistry      Component Value Date/Time   NA 135* 10/06/2015 1141   NA 134* 08/01/2015 2021   K 4.2 10/06/2015 1141   K 3.7 08/01/2015 2021   CL 98* 08/01/2015 2021   CO2 24 10/06/2015 1141   CO2  30 08/01/2015 2021   BUN 14.8 10/06/2015 1141   BUN 15 08/01/2015 2021   CREATININE 0.9 10/06/2015 1141   CREATININE 0.70 08/01/2015 2021   GLU 299* 02/02/2015 1348      Component Value Date/Time   CALCIUM 8.8 10/06/2015 1141   CALCIUM 8.7* 08/01/2015 2021   ALKPHOS 154* 10/06/2015 1141   ALKPHOS 87 07/04/2015 0649   AST 13 10/06/2015 1141   AST 17 07/04/2015 0649   ALT 17 10/06/2015 1141   ALT 12* 07/04/2015 0649   BILITOT 0.48 10/06/2015 1141   BILITOT 0.3 07/04/2015 0649       RADIOGRAPHIC STUDIES: Ct Chest W Contrast  09/15/2015  CLINICAL DATA:  Restaging of right middle lobe lung cancer. EXAM: CT CHEST WITH CONTRAST TECHNIQUE: Multidetector CT imaging of the chest was performed during intravenous contrast administration. CONTRAST:  29m OMNIPAQUE IOHEXOL 300 MG/ML  SOLN COMPARISON:  CT scan of June 29, 2015. FINDINGS: No pneumothorax or definite pleural effusion is noted stable calcified granuloma is noted laterally in the left lower lobe. Immediately inferior to this, stable 2 mm nodule is noted. Stable chronic atelectasis of the right upper and middle lobes is noted. Stable architectural distortion is noted in the hyperinflated right lower lobe most consistent with post radiation fibrosis. No new pulmonary parenchymal findings are noted. There is no evidence of thoracic aortic dissection or aneurysm. No significant osseous abnormality is noted. 13 mm subcarinal lymph node noted on prior exam is not well visualized currently. No definite significant adenopathy is seen at this time. Mild coronary artery calcifications are noted. Stable low densities are noted in the liver which are not significantly changed and most consistent with benign hepatic cysts. No other significant abnormality seen in the visualized portion of the upper abdomen. IMPRESSION: Stable chronic atelectasis of the right upper and middle lobes. Stable probable post radiation fibrosis in the hyperinflated right lower  lobe. No significant changes noted compared to prior exam. Electronically Signed   By: JMarijo Conception M.D.   On: 09/15/2015 09:58    ASSESSMENT AND PLAN: This is a very pleasant 74years old white female with metastatic non-small cell lung cancer, squamous cell carcinoma who is currently undergoing treatment with immunotherapy with Nivolumab status post 28 cycles and tolerating her treatment fairly well except for recently several episodes of diarrhea concerning to be immune mediated from her treatment. This completely resolved after the patient was treated with prednisone that has been tapered. She continues to have uncontrolled hyperglycemia. I will give the patient regular insulin 20 units today. I also advised her to contact her primary care physician for adjustment of her diabetic medications. She will proceed with cycle #29 of her treatment today as scheduled. I will see her back for follow-up visit in 2 weeks for reevaluation with the start of cycle #30.  For the recent right  ankle fracture, the patient will continue physical therapy at home. The patient was advised to call immediately if she has any concerning symptoms in the interval. The patient voices understanding of current disease status and treatment options and is in agreement with the current care plan.  All questions were answered. The patient knows to call the clinic with any problems, questions or concerns. We can certainly see the patient much sooner if necessary.  Disclaimer: This note was dictated with voice recognition software. Similar sounding words can inadvertently be transcribed and may not be corrected upon review.

## 2015-10-20 ENCOUNTER — Ambulatory Visit: Payer: Medicare Other

## 2015-10-20 ENCOUNTER — Ambulatory Visit: Payer: Medicare Other | Admitting: Internal Medicine

## 2015-10-20 ENCOUNTER — Other Ambulatory Visit: Payer: Medicare Other

## 2015-10-20 ENCOUNTER — Other Ambulatory Visit: Payer: Self-pay | Admitting: *Deleted

## 2015-10-20 DIAGNOSIS — C342 Malignant neoplasm of middle lobe, bronchus or lung: Secondary | ICD-10-CM

## 2015-10-21 ENCOUNTER — Ambulatory Visit (HOSPITAL_BASED_OUTPATIENT_CLINIC_OR_DEPARTMENT_OTHER): Payer: Medicare Other

## 2015-10-21 ENCOUNTER — Telehealth: Payer: Self-pay | Admitting: Internal Medicine

## 2015-10-21 ENCOUNTER — Ambulatory Visit (HOSPITAL_BASED_OUTPATIENT_CLINIC_OR_DEPARTMENT_OTHER): Payer: Medicare Other | Admitting: Physician Assistant

## 2015-10-21 ENCOUNTER — Other Ambulatory Visit (HOSPITAL_BASED_OUTPATIENT_CLINIC_OR_DEPARTMENT_OTHER): Payer: Medicare Other

## 2015-10-21 VITALS — BP 146/98 | HR 101 | Temp 98.4°F | Resp 18 | Ht 62.0 in | Wt 147.9 lb

## 2015-10-21 DIAGNOSIS — C342 Malignant neoplasm of middle lobe, bronchus or lung: Secondary | ICD-10-CM

## 2015-10-21 DIAGNOSIS — C3491 Malignant neoplasm of unspecified part of right bronchus or lung: Secondary | ICD-10-CM

## 2015-10-21 DIAGNOSIS — Z5112 Encounter for antineoplastic immunotherapy: Secondary | ICD-10-CM

## 2015-10-21 DIAGNOSIS — C3411 Malignant neoplasm of upper lobe, right bronchus or lung: Secondary | ICD-10-CM

## 2015-10-21 LAB — COMPREHENSIVE METABOLIC PANEL
ALBUMIN: 3.4 g/dL — AB (ref 3.5–5.0)
ALT: 13 U/L (ref 0–55)
AST: 14 U/L (ref 5–34)
Alkaline Phosphatase: 134 U/L (ref 40–150)
Anion Gap: 7 mEq/L (ref 3–11)
BUN: 13.2 mg/dL (ref 7.0–26.0)
CALCIUM: 9.1 mg/dL (ref 8.4–10.4)
CHLORIDE: 106 meq/L (ref 98–109)
CO2: 28 mEq/L (ref 22–29)
CREATININE: 0.8 mg/dL (ref 0.6–1.1)
EGFR: 77 mL/min/{1.73_m2} — ABNORMAL LOW (ref >90)
GLUCOSE: 61 mg/dL — AB (ref 70–140)
Potassium: 4.2 mEq/L (ref 3.5–5.1)
SODIUM: 141 meq/L (ref 136–145)
Total Bilirubin: 0.47 mg/dL (ref 0.20–1.20)
Total Protein: 6.1 g/dL — ABNORMAL LOW (ref 6.4–8.3)

## 2015-10-21 LAB — CBC WITH DIFFERENTIAL/PLATELET
BASO%: 1.3 % (ref 0.0–2.0)
Basophils Absolute: 0.1 10*3/uL (ref 0.0–0.1)
EOS%: 2 % (ref 0.0–7.0)
Eosinophils Absolute: 0.1 10*3/uL (ref 0.0–0.5)
HEMATOCRIT: 44.2 % (ref 34.8–46.6)
HEMOGLOBIN: 14.1 g/dL (ref 11.6–15.9)
LYMPH%: 15.2 % (ref 14.0–49.7)
MCH: 28.7 pg (ref 25.1–34.0)
MCHC: 32 g/dL (ref 31.5–36.0)
MCV: 89.7 fL (ref 79.5–101.0)
MONO#: 0.5 10*3/uL (ref 0.1–0.9)
MONO%: 8.3 % (ref 0.0–14.0)
NEUT#: 4.4 10*3/uL (ref 1.5–6.5)
NEUT%: 73.2 % (ref 38.4–76.8)
Platelets: 188 10*3/uL (ref 145–400)
RBC: 4.93 10*6/uL (ref 3.70–5.45)
RDW: 15 % — AB (ref 11.2–14.5)
WBC: 6 10*3/uL (ref 3.9–10.3)
lymph#: 0.9 10*3/uL (ref 0.9–3.3)

## 2015-10-21 LAB — TECHNOLOGIST REVIEW

## 2015-10-21 MED ORDER — SODIUM CHLORIDE 0.9 % IV SOLN
Freq: Once | INTRAVENOUS | Status: AC
  Administered 2015-10-21: 10:00:00 via INTRAVENOUS

## 2015-10-21 MED ORDER — SODIUM CHLORIDE 0.9 % IV SOLN
240.0000 mg | Freq: Once | INTRAVENOUS | Status: AC
  Administered 2015-10-21: 240 mg via INTRAVENOUS
  Filled 2015-10-21: qty 8

## 2015-10-21 NOTE — Patient Instructions (Signed)
[facility] Discharge Instructions for Patients Receiving Chemotherapy  Today you received the following chemotherapy agents:  Nivolumab.  To help prevent nausea and vomiting after your treatment, we encourage you to take your nausea medication as directed.   If you develop nausea and vomiting that is not controlled by your nausea medication, call the clinic.   BELOW ARE SYMPTOMS THAT SHOULD BE REPORTED IMMEDIATELY:  *FEVER GREATER THAN 100.5 F  *CHILLS WITH OR WITHOUT FEVER  NAUSEA AND VOMITING THAT IS NOT CONTROLLED WITH YOUR NAUSEA MEDICATION  *UNUSUAL SHORTNESS OF BREATH  *UNUSUAL BRUISING OR BLEEDING  TENDERNESS IN MOUTH AND THROAT WITH OR WITHOUT PRESENCE OF ULCERS  *URINARY PROBLEMS  *BOWEL PROBLEMS  UNUSUAL RASH Items with * indicate a potential emergency and should be followed up as soon as possible.  Feel free to call the clinic you have any questions or concerns. The clinic phone number is [phone].  Please show the CHEMO ALERT CARD at check-in to the Emergency Department and triage nurse.   

## 2015-10-21 NOTE — Progress Notes (Signed)
[place] [phone]   [phone]) [phone]  OFFICE PROGRESS NOTE  [name], MD [address] [place] [address] [place] [address]  DIAGNOSIS: Squamous cell lung cancer  Primary site: Lung (Right)  Staging method: AJCC 7th Edition  Clinical: Stage IIIB (T3, N3, M0)  Summary: Stage IIIB (T3, N3, M0)   PRIOR THERAPY:  1) Concurrent chemoradiation with weekly chemotherapy in the form of carboplatin for AUC of 2 and paclitaxel 45 mg/m2. Status post 7 week of treatment. Last dose was given 01/27/2014 no significant response in her disease.  2) Consolidation chemotherapy with carboplatin for AUC of 5 and paclitaxel 175 mg/M2 every 3 weeks with Neulasta support. First dose on 04/15/2014. Status post 3 cycles. Carboplatin was discontinued starting cycle #2 secondary to hypersensitivity reaction.  CURRENT THERAPY: Immunotherapy with Opdivo (Nivolumab) '3mg'$ /kg given every 2 weeks. Status post 30 cycles.  CHEMOTHERAPY INTENT: Control/Palliative  CURRENT # OF CHEMOTHERAPY CYCLES: 29 CURRENT ANTIEMETICS: Zofran, dexamethasone, Compazine  CURRENT SMOKING STATUS: Former smoker, quit 12/05/1997  ORAL CHEMOTHERAPY AND CONSENT: n/a  CURRENT BISPHOSPHONATES USE: None  PAIN MANAGEMENT: no cancer related pain, patient does have RA  NARCOTICS INDUCED CONSTIPATION: none  LIVING WILL AND CODE STATUS: ?  INTERVAL HISTORY: Megan [name] 74 y.o. female returns to the clinic today for follow-up visit. The patient is currently on immunotherapy with Nivolumab status post 30 cycles. She denied having any significant night sweats. The patient denied having any chest pain, shortness of breath, cough or hemoptysis. She has no significant nausea or vomiting, no fever or chills. Her appetite is improved. She has a very mild rash without itching in her extremities and trunk, which appear to be drug related. She has an appointment with her dermatologist in 2  weeks. She denies any bleeding issues.She is here for reevaluation before her treatment with immunotherapy, cycle 31.  MEDICAL HISTORY: Past Medical History  Diagnosis Date  . Hypertension   . Rheumatoid arthritis ([place])   . GERD (gastroesophageal reflux disease)     no meds for  . Hx of radiation therapy 12/16/13-01/30/14    lung 66Gy  . Neuropathy ([place])   . Shortness of breath dyspnea     with exertion  . History of hiatal hernia   . Diabetes mellitus     insulin  . Cancer ([place]) dx'd 09/2013    Lung ca  . Malignant neoplasm of right upper lobe of lung ([place]) 11/28/2013    ALLERGIES:  is allergic to remicade; carboplatin; shellfish-derived products; and hydromorphone.  MEDICATIONS:  Current Outpatient Prescriptions  Medication Sig Dispense Refill  . B-D ULTRAFINE III SHORT PEN 31G X 8 MM MISC   0  . DULoxetine (CYMBALTA) 60 MG capsule Take 1 capsule (60 mg total) by mouth 2 (two) times daily. 60 capsule 11  . fluticasone (CUTIVATE) 0.05 % cream Apply 1 application topically 2 (two) times daily as needed (rash).     . Fluticasone-Salmeterol (ADVAIR DISKUS) 100-50 MCG/DOSE AEPB Inhale 1 puff into the lungs 2 (two) times daily. 60 each 5  . gabapentin (NEURONTIN) 600 MG tablet 600 mg 3 (three) times daily.     [name] HYDROcodone-acetaminophen (NORCO/VICODIN) 5-325 MG tablet Take 1-2 tablets by mouth every 6 (six) hours as needed. 10 tablet 0  . ibuprofen (ADVIL,MOTRIN) 600 MG tablet Take 1 tablet (600 mg total) by mouth every 6 (six) hours as needed. Take with food. (Patient taking differently: Take 600 mg by mouth every 6 (six)  hours as needed (pain). Take with food.) 30 tablet 0  . insulin detemir (LEVEMIR) 100 UNIT/ML injection Inject 28 Units into the skin at bedtime.    . insulin lispro (HUMALOG) 100 UNIT/ML injection Inject 6 Units into the skin 4 (four) times daily - after meals and at bedtime.    [name] loperamide (IMODIUM A-D) 2 MG tablet Take 2 mg by mouth 4 (four) times daily as needed  for diarrhea or loose stools.    . mirabegron ER (MYRBETRIQ) 50 MG TB24 tablet Take 50 mg by mouth daily.    [name] DELICA LANCETS FINE MISC   0  . pantoprazole (PROTONIX) 40 MG tablet TAKE 1 TABLET BY MOUTH DAILY (Patient not taking: Reported on 10/06/2015) 30 tablet 0  . potassium chloride SA (K-DUR,KLOR-CON) 20 MEQ tablet Take 40 mEq by mouth daily.    . predniSONE (DELTASONE) 20 MG tablet Take 3 tablets (60 mg total) by mouth daily with breakfast. (Patient taking differently: Take 10 mg by mouth daily with breakfast. ) 90 tablet 0   No current facility-administered medications for this visit.   Facility-Administered Medications Ordered in Other Visits  Medication Dose Route Frequency Provider Last Rate Last Dose  . sodium chloride 0.9 % injection 10 mL  10 mL Intracatheter PRN Curt Bears, MD   10 mL at 12/30/13 1121    SURGICAL HISTORY:  Past Surgical History  Procedure Laterality Date  . Cholecystectomy    . Abdominal hysterectomy    . Dilation and curettage of uterus    . Abdominal surgery    . Tonsillectomy    . Esophagogastroduodenoscopy (egd) with propofol N/A 10/09/2014    Procedure: ESOPHAGOGASTRODUODENOSCOPY (EGD) WITH PROPOFOL;  Surgeon: Inda Castle, MD;  Location: [facility];  Service: Endoscopy;  Laterality: N/A;  . Savory dilation N/A 10/09/2014    Procedure: SAVORY DILATION;  Surgeon: Inda Castle, MD;  Location: [facility];  Service: Endoscopy;  Laterality: N/A;  . Esophagogastroduodenoscopy N/A 12/12/2014    Procedure: ESOPHAGOGASTRODUODENOSCOPY (EGD);  Surgeon: Inda Castle, MD;  Location: [facility];  Service: Endoscopy;  Laterality: N/A;  with dilation  . Balloon dilation N/A 12/12/2014    Procedure: BALLOON DILATION;  Surgeon: Inda Castle, MD;  Location: [facility];  Service: Endoscopy;  Laterality: N/A;  . Orif ankle fracture Right 07/04/2015    Procedure: OPEN REDUCTION INTERNAL FIXATION (ORIF) ANKLE FRACTURE;  Surgeon: Newt Minion,  MD;  Location: [facility];  Service: Orthopedics;  Laterality: Right;  OPEN REDUCTION, INTERNAL FIXATION OF RIGHT ANKLE FRACTURE.     REVIEW OF SYSTEMS:  Constitutional: positive for fatigue Eyes: negative Ears, nose, mouth, throat, and face: negative Respiratory: positive for dyspnea on exertion Cardiovascular: negative Gastrointestinal: negative for diarrhea Genitourinary:negative Integument/breast:  She has a mild rash like at the trunk and extremities. Hematologic/lymphatic: negative Musculoskeletal:positive for bone pain. Her right metatarsal fracture is improving. Neurological: negative Behavioral/Psych: negative Endocrine: negative Allergic/Immunologic: negative   PHYSICAL EXAMINATION: General appearance: alert, cooperative, fatigued and no distress Head: Normocephalic, without obvious abnormality, atraumatic Neck: no adenopathy, no JVD, supple, symmetrical, trachea midline and thyroid not enlarged, symmetric, no tenderness/mass/nodules Lymph nodes: Cervical, supraclavicular, and axillary nodes normal. Resp: clear to auscultation bilaterally Back: symmetric, no curvature. ROM normal. No CVA tenderness. Cardio: regular rate and rhythm, S1, S2 normal, no murmur, click, rub or gallop GI: soft, non-tender; bowel sounds normal; no masses,  no organomegaly Extremities: extremities normal, atraumatic, no cyanosis or edema. Mild rash-like lesions, non purulent, dry, non  pruritic in the trunk and extremities.   Neurologic: Alert and oriented X 3, normal strength and tone. Normal symmetric reflexes. Normal coordination and gait  ECOG PERFORMANCE STATUS: 1 - Symptomatic but completely ambulatory  Blood pressure 146/98, pulse 101, temperature 98.4 F (36.9 C), temperature source Oral, resp. rate 18, height '5\' 2"'$  (1.575 m), weight 147 lb 14.4 oz (67.087 kg), SpO2 98 %.  LABORATORY DATA: CBC Latest Ref Rng 10/21/2015 10/06/2015 09/22/2015  WBC 3.9 - 10.3 10e3/uL 6.0 7.1 8.2  Hemoglobin 11.6 -  15.9 g/dL 14.1 13.9 13.8  Hematocrit 34.8 - 46.6 % 44.2 42.1 41.7  Platelets 145 - 400 10e3/uL 188 196 164   CMP Latest Ref Rng 10/21/2015 10/06/2015 09/22/2015  Glucose 70 - 140 mg/dl 61(L) 549(H) 231(H)  BUN 7.0 - 26.0 mg/dL 13.2 14.8 20.9  Creatinine 0.6 - 1.1 mg/dL 0.8 0.9 0.7  Sodium 136 - 145 mEq/L 141 135(L) 137  Potassium 3.5 - 5.1 mEq/L 4.2 4.2 3.7  Chloride 101 - 111 mmol/L - - -  CO2 22 - 29 mEq/L '28 24 22  '$ Calcium 8.4 - 10.4 mg/dL 9.1 8.8 8.6  Total Protein 6.4 - 8.3 g/dL 6.1(L) 6.0(L) 5.4(L)  Total Bilirubin 0.20 - 1.20 mg/dL 0.47 0.48 0.30  Alkaline Phos 40 - 150 U/L 134 154(H) 125  AST 5 - 34 U/L '14 13 12  '$ ALT 0 - 55 U/L '13 17 20     '$ RADIOGRAPHIC STUDIES: No results found.  ASSESSMENT AND PLAN: This is a very pleasant 74 years old white female with metastatic non-small cell lung cancer, squamous cell carcinoma who is currently undergoing treatment with immunotherapy with Nivolumab status post 30 cycles and tolerating her treatment fairly well except for recently several episodes of diarrhea concerning to be immune mediated from her treatment.  Her most recent CT on 09/15/2015 shows stable disease. Recommended to continue treatment with Nivolumab with cycle #31 today Will see her back for follow-up visit in 2 weeks for reevaluation prior to cycle 32 For the recent right ankle fracture, the patient will continue physical therapy at home. For her rash, likely drug related, she was offered topical cream but she deferred till next visit, as she has an appointment with her dermatologist soon.   The patient was advised to call immediately if she has any concerning symptoms in the interval. The patient voices understanding of current disease status and treatment options and is in agreement with the current care plan.  All questions were answered. The patient knows to call the clinic with any problems, questions or concerns. We can certainly see the patient much sooner if  necessary.   Rondel Jumbo, PA-C 10/21/2015

## 2015-10-21 NOTE — Telephone Encounter (Signed)
Gave patient relative avs report and appointments for December and January.

## 2015-10-30 ENCOUNTER — Other Ambulatory Visit: Payer: Self-pay | Admitting: *Deleted

## 2015-10-30 DIAGNOSIS — C3411 Malignant neoplasm of upper lobe, right bronchus or lung: Secondary | ICD-10-CM

## 2015-11-03 ENCOUNTER — Ambulatory Visit (HOSPITAL_BASED_OUTPATIENT_CLINIC_OR_DEPARTMENT_OTHER): Payer: Medicare Other | Admitting: Physician Assistant

## 2015-11-03 ENCOUNTER — Ambulatory Visit (HOSPITAL_BASED_OUTPATIENT_CLINIC_OR_DEPARTMENT_OTHER): Payer: Medicare Other

## 2015-11-03 ENCOUNTER — Other Ambulatory Visit (HOSPITAL_BASED_OUTPATIENT_CLINIC_OR_DEPARTMENT_OTHER): Payer: Medicare Other

## 2015-11-03 VITALS — BP 98/52 | HR 114 | Temp 98.4°F | Resp 18 | Ht 62.0 in | Wt 143.6 lb

## 2015-11-03 DIAGNOSIS — L409 Psoriasis, unspecified: Secondary | ICD-10-CM | POA: Diagnosis not present

## 2015-11-03 DIAGNOSIS — C3411 Malignant neoplasm of upper lobe, right bronchus or lung: Secondary | ICD-10-CM

## 2015-11-03 DIAGNOSIS — Z5111 Encounter for antineoplastic chemotherapy: Secondary | ICD-10-CM

## 2015-11-03 DIAGNOSIS — C3491 Malignant neoplasm of unspecified part of right bronchus or lung: Secondary | ICD-10-CM

## 2015-11-03 DIAGNOSIS — E1165 Type 2 diabetes mellitus with hyperglycemia: Secondary | ICD-10-CM

## 2015-11-03 DIAGNOSIS — C342 Malignant neoplasm of middle lobe, bronchus or lung: Secondary | ICD-10-CM

## 2015-11-03 DIAGNOSIS — Z794 Long term (current) use of insulin: Secondary | ICD-10-CM

## 2015-11-03 DIAGNOSIS — Z5112 Encounter for antineoplastic immunotherapy: Secondary | ICD-10-CM

## 2015-11-03 LAB — CBC WITH DIFFERENTIAL/PLATELET
BASO%: 1.7 % (ref 0.0–2.0)
Basophils Absolute: 0.2 10*3/uL — ABNORMAL HIGH (ref 0.0–0.1)
EOS%: 19.4 % — AB (ref 0.0–7.0)
Eosinophils Absolute: 2 10*3/uL — ABNORMAL HIGH (ref 0.0–0.5)
HCT: 45.2 % (ref 34.8–46.6)
HGB: 15.1 g/dL (ref 11.6–15.9)
LYMPH%: 14.8 % (ref 14.0–49.7)
MCH: 29.8 pg (ref 25.1–34.0)
MCHC: 33.4 g/dL (ref 31.5–36.0)
MCV: 89.1 fL (ref 79.5–101.0)
MONO#: 0.6 10*3/uL (ref 0.1–0.9)
MONO%: 5.8 % (ref 0.0–14.0)
NEUT%: 58.3 % (ref 38.4–76.8)
NEUTROS ABS: 5.9 10*3/uL (ref 1.5–6.5)
PLATELETS: 227 10*3/uL (ref 145–400)
RBC: 5.07 10*6/uL (ref 3.70–5.45)
RDW: 14.6 % — ABNORMAL HIGH (ref 11.2–14.5)
WBC: 10.1 10*3/uL (ref 3.9–10.3)
lymph#: 1.5 10*3/uL (ref 0.9–3.3)

## 2015-11-03 LAB — COMPREHENSIVE METABOLIC PANEL
ALT: 13 U/L (ref 0–55)
ANION GAP: 11 meq/L (ref 3–11)
AST: 11 U/L (ref 5–34)
Albumin: 3.6 g/dL (ref 3.5–5.0)
Alkaline Phosphatase: 182 U/L — ABNORMAL HIGH (ref 40–150)
BUN: 21 mg/dL (ref 7.0–26.0)
CHLORIDE: 95 meq/L — AB (ref 98–109)
CO2: 28 meq/L (ref 22–29)
CREATININE: 1 mg/dL (ref 0.6–1.1)
Calcium: 9.3 mg/dL (ref 8.4–10.4)
EGFR: 55 mL/min/{1.73_m2} — ABNORMAL LOW (ref >90)
GLUCOSE: 396 mg/dL — AB (ref 70–140)
Potassium: 3.8 mEq/L (ref 3.5–5.1)
SODIUM: 134 meq/L — AB (ref 136–145)
Total Bilirubin: 0.54 mg/dL (ref 0.20–1.20)
Total Protein: 6.6 g/dL (ref 6.4–8.3)

## 2015-11-03 LAB — TECHNOLOGIST REVIEW

## 2015-11-03 MED ORDER — SODIUM CHLORIDE 0.9 % IV SOLN
240.0000 mg | Freq: Once | INTRAVENOUS | Status: AC
  Administered 2015-11-03: 240 mg via INTRAVENOUS
  Filled 2015-11-03: qty 20

## 2015-11-03 MED ORDER — SODIUM CHLORIDE 0.9 % IV SOLN
Freq: Once | INTRAVENOUS | Status: AC
  Administered 2015-11-03: 12:00:00 via INTRAVENOUS

## 2015-11-03 NOTE — Patient Instructions (Signed)
[facility] Discharge Instructions for Patients Receiving Chemotherapy  Today you received the following chemotherapy agents:  Nivolumab.  To help prevent nausea and vomiting after your treatment, we encourage you to take your nausea medication as directed.   If you develop nausea and vomiting that is not controlled by your nausea medication, call the clinic.   BELOW ARE SYMPTOMS THAT SHOULD BE REPORTED IMMEDIATELY:  *FEVER GREATER THAN 100.5 F  *CHILLS WITH OR WITHOUT FEVER  NAUSEA AND VOMITING THAT IS NOT CONTROLLED WITH YOUR NAUSEA MEDICATION  *UNUSUAL SHORTNESS OF BREATH  *UNUSUAL BRUISING OR BLEEDING  TENDERNESS IN MOUTH AND THROAT WITH OR WITHOUT PRESENCE OF ULCERS  *URINARY PROBLEMS  *BOWEL PROBLEMS  UNUSUAL RASH Items with * indicate a potential emergency and should be followed up as soon as possible.  Feel free to call the clinic you have any questions or concerns. The clinic phone number is [phone].  Please show the CHEMO ALERT CARD at check-in to the Emergency Department and triage nurse.   

## 2015-11-03 NOTE — Progress Notes (Signed)
Okay to treat despite elevated BS per Sharene Butters.  Pt took home insulin prior to arriving for treatment.

## 2015-11-03 NOTE — Progress Notes (Signed)
[facility] [phone]   [phone]  OFFICE PROGRESS NOTE  [name], MD [address] [place] [address] [place] [address]  DIAGNOSIS: Squamous cell lung cancer  Primary site: Lung (Right)  Staging method: AJCC 7th Edition  Clinical: Stage IIIB (T3, N3, M0)  Summary: Stage IIIB (T3, N3, M0)   PRIOR THERAPY:  1) Concurrent chemoradiation with weekly chemotherapy in the form of carboplatin for AUC of 2 and paclitaxel 45 mg/m2. Status post 7 week of treatment. Last dose was given 01/27/2014 no significant response in her disease.  2) Consolidation chemotherapy with carboplatin for AUC of 5 and paclitaxel 175 mg/M2 every 3 weeks with Neulasta support. First dose on 04/15/2014. Status post 3 cycles. Carboplatin was discontinued starting cycle #2 secondary to hypersensitivity reaction.  CURRENT THERAPY: Immunotherapy with Opdivo (Nivolumab) '3mg'$ /kg given every 2 weeks. Status post 31 cycles.  CHEMOTHERAPY INTENT: Control/Palliative  CURRENT # OF CHEMOTHERAPY CYCLES: 31 CURRENT ANTIEMETICS: Zofran, dexamethasone, Compazine  CURRENT SMOKING STATUS: Former smoker, quit 12/05/1997  ORAL CHEMOTHERAPY AND CONSENT: n/a  CURRENT BISPHOSPHONATES USE: None  PAIN MANAGEMENT: no cancer related pain, patient does have RA  NARCOTICS INDUCED CONSTIPATION: none  LIVING WILL AND CODE STATUS: ?  Megan [name] 75 y.o. female returns to the clinic today for follow-up visit. The patient is currently on immunotherapy with Nivolumab status post 31 cycles. She denied having any significant night sweats. The patient denied having any chest pain, shortness of breath, cough or hemoptysis. She has no significant nausea or vomiting, no fever or chills. Her appetite is improved. She has a very mild rash without itching in her extremities and trunk; biopsy taken by her dermatologist concluded that this was psoriasis and is to be  treated with topical meds. She denies any bleeding issues.She is here for reevaluation before her treatment with immunotherapy, cycle 32.  MEDICAL HISTORY: Past Medical History  Diagnosis Date  . Hypertension   . Rheumatoid arthritis ([place])   . GERD (gastroesophageal reflux disease)     no meds for  . Hx of radiation therapy 12/16/13-01/30/14    lung 66Gy  . Neuropathy ([place])   . Shortness of breath dyspnea     with exertion  . History of hiatal hernia   . Diabetes mellitus     insulin  . Cancer ([facility]) dx'd 09/2013    Lung ca  . Malignant neoplasm of right upper lobe of lung ([facility]) 11/28/2013    ALLERGIES:  is allergic to remicade; carboplatin; shellfish-derived products; and hydromorphone.  MEDICATIONS:  Current Outpatient Prescriptions  Medication Sig Dispense Refill  . B-D ULTRAFINE III SHORT PEN 31G X 8 MM MISC   0  . DULoxetine (CYMBALTA) 60 MG capsule Take 1 capsule (60 mg total) by mouth 2 (two) times daily. 60 capsule 11  . fluticasone (CUTIVATE) 0.05 % cream Apply 1 application topically 2 (two) times daily as needed (rash).     . Fluticasone-Salmeterol (ADVAIR DISKUS) 100-50 MCG/DOSE AEPB Inhale 1 puff into the lungs 2 (two) times daily. 60 each 5  . gabapentin (NEURONTIN) 600 MG tablet 600 mg 3 (three) times daily.     [name] HYDROcodone-acetaminophen (NORCO/VICODIN) 5-325 MG tablet Take 1-2 tablets by mouth every 6 (six) hours as needed. 10 tablet 0  . ibuprofen (ADVIL,MOTRIN) 600 MG tablet Take 1 tablet (600 mg total) by mouth every 6 (six) hours as needed. Take with food. (Patient taking differently: Take 600 mg by mouth every  6 (six) hours as needed (pain). Take with food.) 30 tablet 0  . insulin detemir (LEVEMIR) 100 UNIT/ML injection Inject 28 Units into the skin at bedtime.    . insulin lispro (HUMALOG) 100 UNIT/ML injection Inject 6 Units into the skin 4 (four) times daily - after meals and at bedtime.    [name] loperamide (IMODIUM A-D) 2 MG tablet Take 2 mg by mouth 4 (four)  times daily as needed for diarrhea or loose stools.    . mirabegron ER (MYRBETRIQ) 50 MG TB24 tablet Take 50 mg by mouth daily.    [name] DELICA LANCETS FINE MISC   0  . pantoprazole (PROTONIX) 40 MG tablet TAKE 1 TABLET BY MOUTH DAILY (Patient not taking: Reported on 10/06/2015) 30 tablet 0  . potassium chloride SA (K-DUR,KLOR-CON) 20 MEQ tablet Take 40 mEq by mouth daily.     No current facility-administered medications for this visit.   Facility-Administered Medications Ordered in Other Visits  Medication Dose Route Frequency Provider Last Rate Last Dose  . nivolumab (OPDIVO) 240 mg in sodium chloride 0.9 % 100 mL chemo infusion  240 mg Intravenous Once Curt Bears, MD      . sodium chloride 0.9 % injection 10 mL  10 mL Intracatheter PRN Curt Bears, MD   10 mL at 12/30/13 1121    SURGICAL HISTORY:  Past Surgical History  Procedure Laterality Date  . Cholecystectomy    . Abdominal hysterectomy    . Dilation and curettage of uterus    . Abdominal surgery    . Tonsillectomy    . Esophagogastroduodenoscopy (egd) with propofol N/A 10/09/2014    Procedure: ESOPHAGOGASTRODUODENOSCOPY (EGD) WITH PROPOFOL;  Surgeon: Inda Castle, MD;  Location: [facility];  Service: Endoscopy;  Laterality: N/A;  . Savory dilation N/A 10/09/2014    Procedure: SAVORY DILATION;  Surgeon: Inda Castle, MD;  Location: [facility];  Service: Endoscopy;  Laterality: N/A;  . Esophagogastroduodenoscopy N/A 12/12/2014    Procedure: ESOPHAGOGASTRODUODENOSCOPY (EGD);  Surgeon: Inda Castle, MD;  Location: [facility];  Service: Endoscopy;  Laterality: N/A;  with dilation  . Balloon dilation N/A 12/12/2014    Procedure: BALLOON DILATION;  Surgeon: Inda Castle, MD;  Location: [facility];  Service: Endoscopy;  Laterality: N/A;  . Orif ankle fracture Right 07/04/2015    Procedure: OPEN REDUCTION INTERNAL FIXATION (ORIF) ANKLE FRACTURE;  Surgeon: Newt Minion, MD;  Location: [facility];  Service:  Orthopedics;  Laterality: Right;  OPEN REDUCTION, INTERNAL FIXATION OF RIGHT ANKLE FRACTURE.     REVIEW OF SYSTEMS:  Constitutional: positive for fatigue Eyes: negative Ears, nose, mouth, throat, and face: negative Respiratory: positive for dyspnea on exertion Cardiovascular: negative Gastrointestinal: negative for diarrhea Genitourinary:negative Integument/breast:  She has a mild rash like at the trunk and extremities. Hematologic/lymphatic: negative Musculoskeletal:positive for bone pain. Her right metatarsal fracture is improving. Neurological: negative Behavioral/Psych: negative Endocrine: negative Allergic/Immunologic: negative   PHYSICAL EXAMINATION: General appearance: alert, cooperative, fatigued and no distress Head: Normocephalic, without obvious abnormality, atraumatic Neck: no adenopathy, no JVD, supple, symmetrical, trachea midline and thyroid not enlarged, symmetric, no tenderness/mass/nodules Lymph nodes: Cervical, supraclavicular, and axillary nodes normal. Resp: clear to auscultation bilaterally Back: symmetric, no curvature. ROM normal. No CVA tenderness. Cardio: regular rate and rhythm, S1, S2 normal, no murmur, click, rub or gallop GI: soft, non-tender; bowel sounds normal; no masses,  no organomegaly Extremities: extremities normal, atraumatic, no cyanosis or edema. Mild rash-like lesions, non purulent, dry, non pruritic in the trunk  and extremities.   Neurologic: Alert and oriented X 3, normal strength and tone. Normal symmetric reflexes. Normal coordination and gait  ECOG PERFORMANCE STATUS: 1 - Symptomatic but completely ambulatory  Blood pressure 98/52, pulse 114, temperature 98.4 F (36.9 C), temperature source Oral, resp. rate 18, height '5\' 2"'$  (1.575 m), weight 143 lb 9.6 oz (65.137 kg), SpO2 100 %.  LABORATORY DATA: CBC Latest Ref Rng 11/03/2015 10/21/2015 10/06/2015  WBC 3.9 - 10.3 10e3/uL 10.1 6.0 7.1  Hemoglobin 11.6 - 15.9 g/dL 15.1 14.1 13.9    Hematocrit 34.8 - 46.6 % 45.2 44.2 42.1  Platelets 145 - 400 10e3/uL 227 188 196   CMP Latest Ref Rng 11/03/2015 10/21/2015 10/06/2015  Glucose 70 - 140 mg/dl 396(H) 61(L) 549(H)  BUN 7.0 - 26.0 mg/dL 21.0 13.2 14.8  Creatinine 0.6 - 1.1 mg/dL 1.0 0.8 0.9  Sodium 136 - 145 mEq/L 134(L) 141 135(L)  Potassium 3.5 - 5.1 mEq/L 3.8 4.2 4.2  Chloride 101 - 111 mmol/L - - -  CO2 22 - 29 mEq/L '28 28 24  '$ Calcium 8.4 - 10.4 mg/dL 9.3 9.1 8.8  Total Protein 6.4 - 8.3 g/dL 6.6 6.1(L) 6.0(L)  Total Bilirubin 0.20 - 1.20 mg/dL 0.54 0.47 0.48  Alkaline Phos 40 - 150 U/L 182(H) 134 154(H)  AST 5 - 34 U/L '11 14 13  '$ ALT 0 - 55 U/L '13 13 17     '$ RADIOGRAPHIC STUDIES: No results found.  ASSESSMENT AND PLAN: This is a very pleasant 75 years old white female with metastatic non-small cell lung cancer, squamous cell carcinoma who is currently undergoing treatment with immunotherapy with Nivolumab status post 31 cycles and tolerating her treatment fairly well except for recently several episodes of diarrhea concerning to be immune mediated from her treatment.  Her most recent CT on 09/15/2015 shows stable disease. Recommended to continue treatment with Nivolumab with cycle #32 today Will see her back for follow-up visit in 2 weeks for reevaluation prior to cycle 33 After that a new CT chest abdomen and pelvis is to be performed to monitor treatment response For the recent right ankle fracture, the patient will continue physical therapy at home. For her rash, she is to initiate psoriasis therapy by her dermatologist Her blood pressure is low due to patient taking her BP meds this morning, will monitor during treatment She is to see her PCP soon for management of her elevated blood sugars.  The patient was advised to call immediately if she has any concerning symptoms in the Megan. The patient voices understanding of current disease status and treatment options and is in agreement with the current care  plan.  All questions were answered. The patient knows to call the clinic with any problems, questions or concerns. We can certainly see the patient much sooner if necessary.   Rondel Jumbo, PA-C 11/03/2015  ADDENDUM: Hematology/Oncology Attending: I had a face to face encounter with the patient today. I recommended her care plan. This is a very pleasant 75 years old white female with metastatic non-small cell lung cancer, squamous cell carcinoma was currently undergoing treatment with immunotherapy with Nivolumab status post 31 cycle. She is tolerating her treatment well except for mild macular skin rash on the extremities. She was seen recently by her dermatologist and was diagnosed with psoriasis. She is currently undergoing treatment for this condition by her dermatologist. I recommended for the patient to continue her current treatment with Nivolumab as a scheduled. She will receive cycle #32 today. I  will continue to monitor her skin rash closely as this could be also related to her current treatment with Nivolumab. She will come back for follow-up visit in 2 weeks for reevaluation. For the uncontrolled diabetes mellitus, the patient was advised to take her medication as a scheduled and treatment consult with her primary care physician. She was advised to call immediately if she has any concerning symptoms in the Megan.  Disclaimer: This note was dictated with voice recognition software. Similar sounding words can inadvertently be transcribed and may be missed upon review. Eilleen Kempf., MD 11/03/2015

## 2015-11-04 ENCOUNTER — Ambulatory Visit (INDEPENDENT_AMBULATORY_CARE_PROVIDER_SITE_OTHER): Payer: Medicare Other | Admitting: Internal Medicine

## 2015-11-04 ENCOUNTER — Telehealth: Payer: Self-pay | Admitting: Internal Medicine

## 2015-11-04 ENCOUNTER — Encounter: Payer: Self-pay | Admitting: Internal Medicine

## 2015-11-04 VITALS — BP 112/84 | HR 115 | Ht 66.0 in | Wt 147.0 lb

## 2015-11-04 DIAGNOSIS — R000 Tachycardia, unspecified: Secondary | ICD-10-CM | POA: Diagnosis not present

## 2015-11-04 DIAGNOSIS — J439 Emphysema, unspecified: Secondary | ICD-10-CM | POA: Diagnosis not present

## 2015-11-04 NOTE — Patient Instructions (Addendum)
ICD-9-CM ICD-10-CM   1. Pulmonary emphysema, unspecified emphysema type ([place]) 492.8 J43.9   2. Tachycardia 785.0 R00.0    Emphysema: This is stable. Continue inhalers as before. It is safe to take Prevnar vaccine because it is an inactivated vaccine. Please do this after discussion with Dr. Earlie Server  Tachycardia: Unclear cause. This is been present in the past. We will do an echocardiogram. We'll call you with the results.  Follow-up 6 months or sooner if needed

## 2015-11-04 NOTE — Telephone Encounter (Signed)
Per 1/3 pof f/u as scheduled. Patient already scheduled for lab/MM/tx 1/17 - added additional cycles and left message for patient. Patient to get new schedule 1/17.

## 2015-11-04 NOTE — Progress Notes (Signed)
Subjective:     Patient ID: Megan [name], female   DOB: [birth year], 75 y.o.   MRN: [id number]  HPI     OV 11/26/2014  Chief Complaint  Patient presents with  . Follow-up    Pt stated her mobility has decreased. Pt c/o DOE, dry cough. Pt denies CP/tightness.     FU COOPD, Lung cancer with resultant neuropathy, fatigue, etc.. (declined hospice consideration dec 2015). Currently in Nivolumab Rx.    She seems a bit better overall. Her female friend Mr Elnora Morrison is with her today. SHe is now several months into Nivolumab Rx. ECOG is 3 - >70% if time in chair or bed during waking hours per female friend. This is an improvement. Possibly related to home PT visitiing her but she denies this was the case. Home PT is complete. SHe is now keen to join pulmonary rehab at [facility]. She feels it helped her immensely in past. Peripheral neuropathy still a problem - her neurologist wrote to me and wondered about starting lamictal; I am fine wit hthat. (she has failed cymbalta and lyrica in past). Mood some better. Dyspnea is not the major problem of all things below   OV 01/27/15  FU COOPD, Lung cancer with resultant neuropathy, fatigue, etc.. (declined hospice consideration dec 2015). Currently in Nivolumab Rx.   Overall better. Now driving. Could not enter pulmonary rehab due to hyperglycemia - difficult to control Nivlumab on hold. CT chest feb 2016 show cancer under control. Depression, fatigue, neuropathy all better. Of note, she had hypolgycemai few days ago and got aphasic.     OV 05/06/2015  Chief Complaint  Patient presents with  . Follow-up    Pt c/o increased fatigue, increased sob with exertion, and non-productive cough with wheezing      FU COOPD, Lung cancer with resultant neuropathy from platin based Rx , fatigue, etc.. (declined hospice consideration dec 2015). Currently in Nivolumab Rx with high sugar Rx   She presents for copd but has numerouis other complaints - all generally  related to chemo sidefedcs. Last visit she was drivign car and improved. Says was doing well til la week ago. Ols Chart review shows last infusion cycle #21 of nivolumab 04/28/15. Sometime after that having  New rt msk chestg pain, non specific back ache, fatigue, sore throat. That still persists. Significant . Unavle to drive car now. Feel stsarted after a cold but cold is gone. Other symptoms persists but some worse and highlighted in black. Also with sore throat. No wheezing. No worsenign dyspnea. N neuromuscolar weakness or bladder or bowel problems   CT chest 03/09/15 - personally visualized image - shows clearance of cancer essentially IMPRESSION: 1. Stable postsurgical and postradiation change in the right hemi thorax. 2. New left lower lobe pulmonary nodule measuring 6 mm is concerning for a metastatic lesion. Consider short-term follow-up in 3 to 4 weeks to exclude a small focus of infection and if lesion persists recommend biopsy.   Electronically Signed  By: Suzy Bouchard M.D.  On: 03/09/2015 14:44   OV 11/04/2015  Chief Complaint  Patient presents with  . Follow-up    No new complaints. Elevated HR over the las several weeks - worsening.    Follow-up emphysema that is mild and the patient with lung cancer on salvage immunotherapy.  This is six-month follow-up. Dyspnea stable. Overall she's doing well. She did the [place] symptom assessment score that is detailed below. Overall no significant improvement in her quality of life except  as detailed below. In the past one week she is noticing worsening resting heart rate. In fact the nurse noticed heart rate of 120. She says this is been an issue on and off and makes her dyspneic. She thinks this needs a workup. Off note she has not had pneumonia vaccine on the assumption that this is a live vaccine. I reassured her that Prevnar is inactivated vaccine but she wants to take it only after discussion with Dr. Earlie Server the  oncologist  Past medical history - Reports accidental fracture of the right ankle in fall 2017. Apparently this was extensive. Was operated upon. Subsequently she also broke her right foot. She does not think he is a pathological fractures because of cancer. She thinks it could be because of osteoporosis. Any event she is now using a cane and she is doing well.  - Last seen by oncology 11/03/2015. On  Nivolumab immunotherapy every 2 weeks. She is finished 32 cycles. She is on multiple antiemetics. She was noticed to have some rash on her extremities which oncology is monitoring. She had CT scan of the chest 09/15/2015 and when compared to October 2016 it is reported to have stable right upper lobe and middle lobe atelectasis and right lower lobe radiation fibrosis. I personally did not visualize this film.  Lab work 10/21/2059 and reviewed with a creatinine of 0.8 mg percent and a normal sodium and potassium and hemoglobin that is normal at 14 g percent with normal white count and platelets. Her albumin is normal at 3.4 g percent as well.   [place] Symptom Assessment Numerical Scale 0 is no problem -> 10 worst problem 07/29/2014   08/27/2014  09/30/2014  01/27/15  05/06/2015  11/04/2015   No Pain -> Worst pain 6, in mid scapula, left shoulder, buttock and thigh and feet - did have platin neuropathy 8.5 7, some better 5 9 2, only in foot now  No Tiredness -> Worset tiredness 9 7 9, worse '5 9 2  '$ No Nausea -> Worst nausea 1 0 9, worse 3 3 0  No Depression -> Worst depression 4 9 9, same 3 3 0  No Anxiety -> Worst Anxiety 6 9 7, same '6 3 2  '$ No Drowsiness -> Worst Drowsiness 10 7 9, worse 3 8 0  Best appetite-> Worst Appetitle '7 10 8 '$ same '7 3 2  '$ Best Feeling of well being -> Worst feeling 1 6 7, worse '8 8 4  '$ No dyspnea-> Worst dyspnea 9 8 4, better '4 8 2  '$ Other problem (none -> severe) none Tachycardia - Physical therapy refuses to work wit her. Now in AECOPD  with wheeze on exam 10 nos 7. hypoglycemia and aphasia. Called EMS easter sunday 8 x  Completed by  MR/patent MR/patient patient patient  patient  comments  All worse Some better, some worse Improved, driving around  Driving, uses cane due to rt foot/ankle fracture sept 2016    Immunization History  Administered Date(s) Administered  . Influenza,inj,Quad PF,36+ Mos 11/07/2013, 07/14/2014, 08/25/2015      Review of Systems     Objective:   Physical Exam  Constitutional: She is oriented to person, place, and time. She appears well-developed and well-nourished. No distress.  Deconditioned but looking much better  HENT:  Head: Normocephalic and atraumatic.  Right Ear: External ear normal.  Left Ear: External ear normal.  Mouth/Throat: Oropharynx is clear and moist. No oropharyngeal exudate.  Wig +  Eyes: Conjunctivae and EOM are normal. Pupils  are equal, round, and reactive to light. Right eye exhibits no discharge. Left eye exhibits no discharge. No scleral icterus.  Neck: Normal range of motion. Neck supple. No JVD present. No tracheal deviation present. No thyromegaly present.  Cardiovascular: Normal rate, regular rhythm, normal heart sounds and intact distal pulses.  Exam reveals no gallop and no friction rub.   No murmur heard. Pulmonary/Chest: Effort normal and breath sounds normal. No respiratory distress. She has no wheezes. She has no rales. She exhibits no tenderness.  Abdominal: Soft. Bowel sounds are normal. She exhibits no distension and no mass. There is no tenderness. There is no rebound and no guarding.  Musculoskeletal: Normal range of motion. She exhibits edema. She exhibits no tenderness.  Antalgic gait + Uses cane+  Lymphadenopathy:    She has no cervical adenopathy.  Neurological: She is alert and oriented to person, place, and time. She has normal reflexes. No cranial nerve deficit. She exhibits normal muscle tone. Coordination normal.  Skin: Skin is  warm and dry. No rash noted. She is not diaphoretic. No erythema. No pallor.  Psychiatric: She has a normal mood and affect. Her behavior is normal. Judgment and thought content normal.  Vitals reviewed.    Filed Vitals:   11/04/15 1545  BP: 112/84  Pulse: 115  Height: '5\' 6"'$  (1.676 m)  Weight: 147 lb (66.679 kg)  SpO2: 97%        Assessment:       ICD-9-CM ICD-10-CM   1. Pulmonary emphysema, unspecified emphysema type ([place]) 492.8 J43.9   2. Tachycardia 785.0 R00.0   3. Need for prophylactic vaccination against Streptococcus pneumoniae (pneumococcus) V03.82 Z23        Plan:      Emphysema: This is stable. Continue inhalers as before. It is safe to take Prevnar vaccine because it is an inactivated vaccine. Please do this after discussion with Dr. Earlie Server  Tachycardia: Unclear cause. . This is been present in the past. We will do an echocardiogram. We'll call you with the results.  Follow-up 6 months or sooner if needed       Dr. Brand Males, M.D., [facility].C.P Pulmonary and Critical Care Medicine Staff Physician [facility] Pager: [phone], If no answer or between  15:00h - 7:00h: call [phone]  11/04/2015 4:25 PM

## 2015-11-06 ENCOUNTER — Ambulatory Visit (HOSPITAL_COMMUNITY)
Admission: RE | Admit: 2015-11-06 | Discharge: 2015-11-06 | Disposition: A | Discharge status: Home / Self Care | Payer: Medicare Other | Source: Ambulatory Visit | Attending: Internal Medicine | Admitting: Internal Medicine

## 2015-11-06 DIAGNOSIS — R000 Tachycardia, unspecified: Secondary | ICD-10-CM | POA: Insufficient documentation

## 2015-11-06 DIAGNOSIS — I10 Essential (primary) hypertension: Secondary | ICD-10-CM | POA: Insufficient documentation

## 2015-11-06 DIAGNOSIS — E119 Type 2 diabetes mellitus without complications: Secondary | ICD-10-CM | POA: Diagnosis not present

## 2015-11-06 NOTE — Progress Notes (Signed)
Echocardiogram 2D Echocardiogram has been performed.  Tresa Res 11/06/2015, 3:08 PM

## 2015-11-13 ENCOUNTER — Telehealth: Payer: Self-pay | Admitting: Internal Medicine

## 2015-11-13 NOTE — Telephone Encounter (Signed)
LEt ANGELIS GATES know that echo is normnal

## 2015-11-13 NOTE — Telephone Encounter (Signed)
Pt is aware of results. 

## 2015-11-17 ENCOUNTER — Other Ambulatory Visit (HOSPITAL_BASED_OUTPATIENT_CLINIC_OR_DEPARTMENT_OTHER): Payer: Medicare Other

## 2015-11-17 ENCOUNTER — Encounter: Payer: Self-pay | Admitting: Internal Medicine

## 2015-11-17 ENCOUNTER — Telehealth: Payer: Self-pay | Admitting: Internal Medicine

## 2015-11-17 ENCOUNTER — Ambulatory Visit (HOSPITAL_BASED_OUTPATIENT_CLINIC_OR_DEPARTMENT_OTHER): Payer: Medicare Other | Admitting: Internal Medicine

## 2015-11-17 ENCOUNTER — Other Ambulatory Visit: Payer: Self-pay | Admitting: Internal Medicine

## 2015-11-17 ENCOUNTER — Ambulatory Visit (HOSPITAL_BASED_OUTPATIENT_CLINIC_OR_DEPARTMENT_OTHER): Payer: Medicare Other

## 2015-11-17 ENCOUNTER — Other Ambulatory Visit: Payer: Self-pay | Admitting: Medical Oncology

## 2015-11-17 ENCOUNTER — Telehealth: Payer: Self-pay | Admitting: Neurology

## 2015-11-17 VITALS — BP 94/50 | HR 107 | Temp 98.0°F | Resp 18 | Ht 66.0 in | Wt 144.7 lb

## 2015-11-17 DIAGNOSIS — C3411 Malignant neoplasm of upper lobe, right bronchus or lung: Secondary | ICD-10-CM

## 2015-11-17 DIAGNOSIS — Z79899 Other long term (current) drug therapy: Secondary | ICD-10-CM | POA: Diagnosis not present

## 2015-11-17 DIAGNOSIS — C3492 Malignant neoplasm of unspecified part of left bronchus or lung: Secondary | ICD-10-CM | POA: Diagnosis not present

## 2015-11-17 DIAGNOSIS — E1165 Type 2 diabetes mellitus with hyperglycemia: Secondary | ICD-10-CM | POA: Diagnosis not present

## 2015-11-17 DIAGNOSIS — Z5112 Encounter for antineoplastic immunotherapy: Secondary | ICD-10-CM

## 2015-11-17 DIAGNOSIS — C342 Malignant neoplasm of middle lobe, bronchus or lung: Secondary | ICD-10-CM

## 2015-11-17 DIAGNOSIS — L409 Psoriasis, unspecified: Secondary | ICD-10-CM | POA: Diagnosis not present

## 2015-11-17 LAB — COMPREHENSIVE METABOLIC PANEL
ALBUMIN: 3.4 g/dL — AB (ref 3.5–5.0)
ALK PHOS: 149 U/L (ref 40–150)
ALT: 11 U/L (ref 0–55)
ANION GAP: 11 meq/L (ref 3–11)
AST: 13 U/L (ref 5–34)
BILIRUBIN TOTAL: 0.68 mg/dL (ref 0.20–1.20)
BUN: 26.7 mg/dL — ABNORMAL HIGH (ref 7.0–26.0)
CALCIUM: 9 mg/dL (ref 8.4–10.4)
CO2: 26 meq/L (ref 22–29)
CREATININE: 1 mg/dL (ref 0.6–1.1)
Chloride: 100 mEq/L (ref 98–109)
EGFR: 59 mL/min/{1.73_m2} — ABNORMAL LOW (ref >90)
Glucose: 304 mg/dl — ABNORMAL HIGH (ref 70–140)
Potassium: 3.5 mEq/L (ref 3.5–5.1)
Sodium: 137 mEq/L (ref 136–145)
TOTAL PROTEIN: 6.4 g/dL (ref 6.4–8.3)

## 2015-11-17 LAB — CBC WITH DIFFERENTIAL/PLATELET
BASO%: 0.7 % (ref 0.0–2.0)
Basophils Absolute: 0.1 10*3/uL (ref 0.0–0.1)
EOS ABS: 3.9 10*3/uL — AB (ref 0.0–0.5)
EOS%: 36.2 % — ABNORMAL HIGH (ref 0.0–7.0)
HEMATOCRIT: 44.4 % (ref 34.8–46.6)
HEMOGLOBIN: 14.5 g/dL (ref 11.6–15.9)
LYMPH#: 1.2 10*3/uL (ref 0.9–3.3)
LYMPH%: 10.6 % — ABNORMAL LOW (ref 14.0–49.7)
MCH: 28.9 pg (ref 25.1–34.0)
MCHC: 32.8 g/dL (ref 31.5–36.0)
MCV: 88.3 fL (ref 79.5–101.0)
MONO#: 0.6 10*3/uL (ref 0.1–0.9)
MONO%: 5.5 % (ref 0.0–14.0)
NEUT%: 47 % (ref 38.4–76.8)
NEUTROS ABS: 5.1 10*3/uL (ref 1.5–6.5)
PLATELETS: 192 10*3/uL (ref 145–400)
RBC: 5.02 10*6/uL (ref 3.70–5.45)
RDW: 15 % — ABNORMAL HIGH (ref 11.2–14.5)
WBC: 10.9 10*3/uL — AB (ref 3.9–10.3)

## 2015-11-17 LAB — TECHNOLOGIST REVIEW

## 2015-11-17 LAB — TSH: TSH: 2.379 m[IU]/L (ref 0.308–3.960)

## 2015-11-17 MED ORDER — SODIUM CHLORIDE 0.9 % IV SOLN
Freq: Once | INTRAVENOUS | Status: AC
  Administered 2015-11-17: 13:00:00 via INTRAVENOUS

## 2015-11-17 MED ORDER — NIVOLUMAB CHEMO INJECTION 100 MG/10ML
240.0000 mg | Freq: Once | INTRAVENOUS | Status: AC
  Administered 2015-11-17: 240 mg via INTRAVENOUS
  Filled 2015-11-17: qty 24

## 2015-11-17 NOTE — Telephone Encounter (Signed)
Received fax. Will work on [place].

## 2015-11-17 NOTE — [facility] (Signed)
[place] [phone]   [phone]  OFFICE PROGRESS NOTE  [name], MD [address] [place] [address] [place] [address]  DIAGNOSIS: Squamous cell lung cancer  Primary site: Lung (Right)  Staging method: AJCC 7th Edition  Clinical: Stage IIIB (T3, N3, M0)  Summary: Stage IIIB (T3, N3, M0)   PRIOR THERAPY:  1) Concurrent chemoradiation with weekly chemotherapy in the form of carboplatin for AUC of 2 and paclitaxel 45 mg/m2. Status post 7 week of treatment. Last dose was given 01/27/2014 no significant response in her disease.  2) Consolidation chemotherapy with carboplatin for AUC of 5 and paclitaxel 175 mg/M2 every 3 weeks with Neulasta support. First dose on 04/15/2014. Status post 3 cycles. Carboplatin was discontinued starting cycle #2 secondary to hypersensitivity reaction.  CURRENT THERAPY: Immunotherapy with Opdivo (Nivolumab) '3mg'$ /kg given every 2 weeks. Status post 32 cycles.  CHEMOTHERAPY INTENT: Control/Palliative  CURRENT # OF CHEMOTHERAPY CYCLES: 33 CURRENT ANTIEMETICS: Zofran, dexamethasone, Compazine  CURRENT SMOKING STATUS: Former smoker, quit 12/05/1997  ORAL CHEMOTHERAPY AND CONSENT: n/a  CURRENT BISPHOSPHONATES USE: None  PAIN MANAGEMENT: no cancer related pain, patient does have RA  NARCOTICS INDUCED CONSTIPATION: none  LIVING WILL AND CODE STATUS: ?  INTERVAL HISTORY: Megan [name] 75 y.o. female returns to the clinic today for follow-up visit. The patient is currently on immunotherapy with Nivolumab status post 32 cycles. The patient related the last cycle of her immunotherapy fairly well with no significant complaints except for few areas of macular rash on the arms & thighs. She denied having any diarrhea.  Her diabetes is still not well-controlled. She continued to have significant hyperglycemia. She denied having any significant weight loss or night sweats. The patient denied having any  chest pain, shortness of breath, cough or hemoptysis. She has no significant nausea or vomiting, no fever or chills. She is here today to start cycle #33 of her treatment.  MEDICAL HISTORY: Past Medical History  Diagnosis Date  . Hypertension   . Rheumatoid arthritis ([place])   . GERD (gastroesophageal reflux disease)     no meds for  . Hx of radiation therapy 12/16/13-01/30/14    lung 66Gy  . Neuropathy ([place])   . Shortness of breath dyspnea     with exertion  . History of hiatal hernia   . Diabetes mellitus     insulin  . Cancer ([place]) dx'd 09/2013    Lung ca  . Malignant neoplasm of right upper lobe of lung ([place]) 11/28/2013    ALLERGIES:  is allergic to remicade; carboplatin; shellfish-derived products; and hydromorphone.  MEDICATIONS:  Current Outpatient Prescriptions  Medication Sig Dispense Refill  . B-D ULTRAFINE III SHORT PEN 31G X 8 MM MISC   0  . DULoxetine (CYMBALTA) 60 MG capsule Take 1 capsule (60 mg total) by mouth 2 (two) times daily. 60 capsule 11  . fluticasone (CUTIVATE) 0.05 % cream Apply 1 application topically 2 (two) times daily as needed (rash).     . Fluticasone-Salmeterol (ADVAIR DISKUS) 100-50 MCG/DOSE AEPB Inhale 1 puff into the lungs 2 (two) times daily. 60 each 5  . HUMALOG KWIKPEN 100 UNIT/ML KiwkPen Inject 10 Units into the skin 4 (four) times daily as needed.  11  . HYDROcodone-acetaminophen (NORCO/VICODIN) 5-325 MG tablet Take 1-2 tablets by mouth every 6 (six) hours as needed. 10 tablet 0  . ibuprofen (ADVIL,MOTRIN) 600 MG tablet Take 1 tablet (600 mg total) by mouth every 6 (six) hours as  needed. Take with food. (Patient taking differently: Take 600 mg by mouth every 6 (six) hours as needed (pain). Take with food.) 30 tablet 0  . insulin detemir (LEVEMIR) 100 UNIT/ML injection Inject 28 Units into the skin at bedtime.    . insulin lispro (HUMALOG) 100 UNIT/ML injection Inject 6 Units into the skin 4 (four) times daily - after meals and at bedtime.    [name]  losartan-hydrochlorothiazide (HYZAAR) 100-25 MG tablet Take 1 tablet by mouth daily.    [name] nystatin-triamcinolone ointment (MYCOLOG) Apply 1 application topically 2 (two) times daily.    [name] DELICA LANCETS FINE MISC   0  . potassium chloride SA (K-DUR,KLOR-CON) 20 MEQ tablet Take 40 mEq by mouth daily.    [name] gabapentin (NEURONTIN) 600 MG tablet 600 mg 3 (three) times daily. Reported on 11/17/2015     No current facility-administered medications for this visit.   Facility-Administered Medications Ordered in Other Visits  Medication Dose Route Frequency Provider Last Rate Last Dose  . sodium chloride 0.9 % injection 10 mL  10 mL Intracatheter PRN Curt Bears, MD   10 mL at 12/30/13 1121    SURGICAL HISTORY:  Past Surgical History  Procedure Laterality Date  . Cholecystectomy    . Abdominal hysterectomy    . Dilation and curettage of uterus    . Abdominal surgery    . Tonsillectomy    . Esophagogastroduodenoscopy (egd) with propofol N/A 10/09/2014    Procedure: ESOPHAGOGASTRODUODENOSCOPY (EGD) WITH PROPOFOL;  Surgeon: Inda Castle, MD;  Location: [facility];  Service: Endoscopy;  Laterality: N/A;  . Savory dilation N/A 10/09/2014    Procedure: SAVORY DILATION;  Surgeon: Inda Castle, MD;  Location: [facility];  Service: Endoscopy;  Laterality: N/A;  . Esophagogastroduodenoscopy N/A 12/12/2014    Procedure: ESOPHAGOGASTRODUODENOSCOPY (EGD);  Surgeon: Inda Castle, MD;  Location[facility];  Service: Endoscopy;  Laterality: N/A;  with dilation  . Balloon dilation N/A 12/12/2014    Procedure: BALLOON DILATION;  Surgeon: Inda Castle, MD;  Location: [facility];  Service: Endoscopy;  Laterality: N/A;  . Orif ankle fracture Right 07/04/2015    Procedure: OPEN REDUCTION INTERNAL FIXATION (ORIF) ANKLE FRACTURE;  Surgeon: Newt Minion, MD;  Location: [facility];  Service: Orthopedics;  Laterality: Right;  OPEN REDUCTION, INTERNAL FIXATION OF RIGHT ANKLE FRACTURE.     REVIEW  OF SYSTEMS:  Constitutional: positive for fatigue Eyes: negative Ears, nose, mouth, throat, and face: negative Respiratory: negative Cardiovascular: negative Gastrointestinal: negative Genitourinary:negative Integument/breast: positive for rash Hematologic/lymphatic: negative Musculoskeletal:negative Neurological: negative Behavioral/Psych: negative Endocrine: negative Allergic/Immunologic: negative   PHYSICAL EXAMINATION: General appearance: alert, cooperative, fatigued and no distress Head: Normocephalic, without obvious abnormality, atraumatic Neck: no adenopathy, no JVD, supple, symmetrical, trachea midline and thyroid not enlarged, symmetric, no tenderness/mass/nodules Lymph nodes: Cervical, supraclavicular, and axillary nodes normal. Resp: clear to auscultation bilaterally Back: symmetric, no curvature. ROM normal. No CVA tenderness. Cardio: regular rate and rhythm, S1, S2 normal, no murmur, click, rub or gallop GI: soft, non-tender; bowel sounds normal; no masses,  no organomegaly Extremities: extremities normal, atraumatic, no cyanosis or edema Neurologic: Alert and oriented X 3, normal strength and tone. Normal symmetric reflexes. Normal coordination and gait  Skin exam: few macular areas of rash on the upper and lower extremities.     ECOG PERFORMANCE STATUS: 1 - Symptomatic but completely ambulatory  Blood pressure 94/50, pulse 107, temperature 98 F (36.7 C), temperature source Oral, resp. rate 18, height '5\' 6"'$  (1.676 m),  weight 144 lb 11.2 oz (65.635 kg), SpO2 99 %.  LABORATORY DATA: Lab Results  Component Value Date   WBC 10.9* 11/17/2015   HGB 14.5 11/17/2015   HCT 44.4 11/17/2015   MCV 88.3 11/17/2015   PLT 192 11/17/2015      Chemistry      Component Value Date/Time   NA 137 11/17/2015 1056   NA 134* 08/01/2015 2021   K 3.5 11/17/2015 1056   K 3.7 08/01/2015 2021   CL 98* 08/01/2015 2021   CO2 26 11/17/2015 1056   CO2 30 08/01/2015 2021   BUN  26.7* 11/17/2015 1056   BUN 15 08/01/2015 2021   CREATININE 1.0 11/17/2015 1056   CREATININE 0.70 08/01/2015 2021   GLU 299* 02/02/2015 1348      Component Value Date/Time   CALCIUM 9.0 11/17/2015 1056   CALCIUM 8.7* 08/01/2015 2021   ALKPHOS 149 11/17/2015 1056   ALKPHOS 87 07/04/2015 0649   AST 13 11/17/2015 1056   AST 17 07/04/2015 0649   ALT 11 11/17/2015 1056   ALT 12* 07/04/2015 0649   BILITOT 0.68 11/17/2015 1056   BILITOT 0.3 07/04/2015 0649       RADIOGRAPHIC STUDIES: No results found.  ASSESSMENT AND PLAN: This is a very pleasant 75 years old white female with metastatic non-small cell lung cancer, squamous cell carcinoma who is currently undergoing treatment with immunotherapy with Nivolumab status post 28 cycles and tolerating her treatment fairly well except for recently several episodes of diarrhea concerning to be immune mediated from her treatment. This completely resolved after the patient was treated with prednisone that has been tapered. She continues to have uncontrolled hyperglycemia. I recommended for the patient to contact her primary care physician for adjustment of her diabetic medications. She will proceed with cycle #33 of her treatment today as scheduled. I will see her back for follow-up visit in 2 weeks for reevaluation with the start of cycle #33.  For the skin rash, this is most likely secondary to her treatment but the patient was also diagnosed with psoriasis and her dermatologist is treating her rash as psoriasis. We will continue to monitor this macular skin rash closely. The patient was advised to call immediately if she has any concerning symptoms in the interval. The patient voices understanding of current disease status and treatment options and is in agreement with the current care plan.  All questions were answered. The patient knows to call the clinic with any problems, questions or concerns. We can certainly see the patient much sooner if  necessary.  Disclaimer: This note was dictated with voice recognition software. Similar sounding words can inadvertently be transcribed and may not be corrected upon review.

## 2015-11-17 NOTE — Patient Instructions (Signed)
[facility] Discharge Instructions for Patients Receiving Chemotherapy  Today you received the following chemotherapy agents:  Nivolumab.  To help prevent nausea and vomiting after your treatment, we encourage you to take your nausea medication as directed.   If you develop nausea and vomiting that is not controlled by your nausea medication, call the clinic.   BELOW ARE SYMPTOMS THAT SHOULD BE REPORTED IMMEDIATELY:  *FEVER GREATER THAN 100.5 F  *CHILLS WITH OR WITHOUT FEVER  NAUSEA AND VOMITING THAT IS NOT CONTROLLED WITH YOUR NAUSEA MEDICATION  *UNUSUAL SHORTNESS OF BREATH  *UNUSUAL BRUISING OR BLEEDING  TENDERNESS IN MOUTH AND THROAT WITH OR WITHOUT PRESENCE OF ULCERS  *URINARY PROBLEMS  *BOWEL PROBLEMS  UNUSUAL RASH Items with * indicate a potential emergency and should be followed up as soon as possible.  Feel free to call the clinic you have any questions or concerns. The clinic phone number is [phone].  Please show the CHEMO ALERT CARD at check-in to the Emergency Department and triage nurse.   

## 2015-11-17 NOTE — Telephone Encounter (Signed)
Pt returned Megan [name]'s call, message relayed. Pt said thank you!

## 2015-11-17 NOTE — Telephone Encounter (Signed)
pt sch already sch out-adv [facility] will call to sch CT scan

## 2015-11-17 NOTE — Telephone Encounter (Signed)
Patient is calling and states that [facility] called her and stated Rx Gabapentin 600 mg needs prior approval for her insurance.  She stated they say that [facility] sent a fax in regard to this. Thanks!

## 2015-11-17 NOTE — Telephone Encounter (Signed)
Called pt. LVM for her to call back. Please let pt know I received fax and will be working on this. Thank you

## 2015-11-17 NOTE — Telephone Encounter (Signed)
Called [facility] for Gralise '600mg'$  PA. Spoke to [place]. Gave tried/failed medications. ICD-10 code G62.0. She states she listed medication as pending and our office will receive fax to let us know decision on PA for medication.

## 2015-11-23 NOTE — Telephone Encounter (Signed)
Received denial from insurance company for [place] for [place].

## 2015-11-27 ENCOUNTER — Emergency Department (HOSPITAL_COMMUNITY)
Admission: EM | Admit: 2015-11-27 | Discharge: 2015-11-27 | Disposition: A | Discharge status: Home / Self Care | Payer: Medicare Other | Attending: Emergency Medicine | Admitting: Emergency Medicine

## 2015-11-27 ENCOUNTER — Encounter (HOSPITAL_COMMUNITY): Payer: Self-pay

## 2015-11-27 DIAGNOSIS — Z7951 Long term (current) use of inhaled steroids: Secondary | ICD-10-CM | POA: Diagnosis not present

## 2015-11-27 DIAGNOSIS — I10 Essential (primary) hypertension: Secondary | ICD-10-CM | POA: Diagnosis not present

## 2015-11-27 DIAGNOSIS — Z9049 Acquired absence of other specified parts of digestive tract: Secondary | ICD-10-CM | POA: Insufficient documentation

## 2015-11-27 DIAGNOSIS — G629 Polyneuropathy, unspecified: Secondary | ICD-10-CM | POA: Insufficient documentation

## 2015-11-27 DIAGNOSIS — Z794 Long term (current) use of insulin: Secondary | ICD-10-CM | POA: Insufficient documentation

## 2015-11-27 DIAGNOSIS — Z90710 Acquired absence of both cervix and uterus: Secondary | ICD-10-CM | POA: Diagnosis not present

## 2015-11-27 DIAGNOSIS — R197 Diarrhea, unspecified: Secondary | ICD-10-CM | POA: Diagnosis present

## 2015-11-27 DIAGNOSIS — Z87891 Personal history of nicotine dependence: Secondary | ICD-10-CM | POA: Diagnosis not present

## 2015-11-27 DIAGNOSIS — E119 Type 2 diabetes mellitus without complications: Secondary | ICD-10-CM | POA: Diagnosis not present

## 2015-11-27 DIAGNOSIS — M069 Rheumatoid arthritis, unspecified: Secondary | ICD-10-CM | POA: Insufficient documentation

## 2015-11-27 DIAGNOSIS — Z8719 Personal history of other diseases of the digestive system: Secondary | ICD-10-CM | POA: Insufficient documentation

## 2015-11-27 DIAGNOSIS — Z79899 Other long term (current) drug therapy: Secondary | ICD-10-CM | POA: Diagnosis not present

## 2015-11-27 DIAGNOSIS — Z85118 Personal history of other malignant neoplasm of bronchus and lung: Secondary | ICD-10-CM | POA: Diagnosis not present

## 2015-11-27 LAB — CBC WITH DIFFERENTIAL/PLATELET
Basophils Absolute: 0.1 10*3/uL (ref 0.0–0.1)
Basophils Relative: 1 %
EOS PCT: 3 %
Eosinophils Absolute: 0.2 10*3/uL (ref 0.0–0.7)
HEMATOCRIT: 42.6 % (ref 36.0–46.0)
HEMOGLOBIN: 14.2 g/dL (ref 12.0–15.0)
LYMPHS ABS: 1 10*3/uL (ref 0.7–4.0)
LYMPHS PCT: 15 %
MCH: 29.8 pg (ref 26.0–34.0)
MCHC: 33.3 g/dL (ref 30.0–36.0)
MCV: 89.5 fL (ref 78.0–100.0)
Monocytes Absolute: 1.1 10*3/uL — ABNORMAL HIGH (ref 0.1–1.0)
Monocytes Relative: 16 %
NEUTROS PCT: 65 %
Neutro Abs: 4.4 10*3/uL (ref 1.7–7.7)
Platelets: 182 10*3/uL (ref 150–400)
RBC: 4.76 MIL/uL (ref 3.87–5.11)
RDW: 14.5 % (ref 11.5–15.5)
WBC: 6.9 10*3/uL (ref 4.0–10.5)

## 2015-11-27 LAB — BASIC METABOLIC PANEL
Anion gap: 13 (ref 5–15)
BUN: 14 mg/dL (ref 6–20)
CHLORIDE: 96 mmol/L — AB (ref 101–111)
CO2: 25 mmol/L (ref 22–32)
Calcium: 9 mg/dL (ref 8.9–10.3)
Creatinine, Ser: 0.91 mg/dL (ref 0.44–1.00)
GFR calc Af Amer: >60 mL/min (ref >60)
GFR calc non Af Amer: >60 mL/min (ref >60)
Glucose, Bld: 386 mg/dL — ABNORMAL HIGH (ref 65–99)
POTASSIUM: 4.2 mmol/L (ref 3.5–5.1)
SODIUM: 134 mmol/L — AB (ref 135–145)

## 2015-11-27 LAB — POC OCCULT BLOOD, ED: Fecal Occult Bld: POSITIVE — AB

## 2015-11-27 NOTE — ED Provider Notes (Signed)
CSN: [id number]     Arrival date & time 11/27/15  1017 History   First MD Initiated Contact with Patient 11/27/15 1031     Chief Complaint  Patient presents with  . Diarrhea     (Consider location/radiation/quality/duration/timing/severity/associated sxs/prior Treatment) HPI   Megan [name] is a 75 y.o. female who presents for evaluation of watery diarrhea, brown in color for several days. She has had similar problem in the past related to her immunotherapy, for cancer. Her last infusion was about 10 days ago. She is concerned because today she noticed some blood on tissue when wiping, and dripping some blood in the commode. She has had decreased appetite for 3 days, but no vomiting. She denies fever, chills, cough, chest pain, weakness or dizziness. There are no other known modifying factors.   Past Medical History  Diagnosis Date  . Hypertension   . Rheumatoid arthritis ([place])   . GERD (gastroesophageal reflux disease)     no meds for  . Hx of radiation therapy 12/16/13-01/30/14    lung 66Gy  . Neuropathy ([place])   . Shortness of breath dyspnea     with exertion  . History of hiatal hernia   . Diabetes mellitus     insulin  . Cancer ([place]) dx'd 09/2013    Lung ca  . Malignant neoplasm of right upper lobe of lung ([place]) 11/28/2013   Past Surgical History  Procedure Laterality Date  . Cholecystectomy    . Abdominal hysterectomy    . Dilation and curettage of uterus    . Abdominal surgery    . Tonsillectomy    . Esophagogastroduodenoscopy (egd) with propofol N/A 10/09/2014    Procedure: ESOPHAGOGASTRODUODENOSCOPY (EGD) WITH PROPOFOL;  Surgeon: Inda Castle, MD;  Location: [facility];  Service: Endoscopy;  Laterality: N/A;  . Savory dilation N/A 10/09/2014    Procedure: SAVORY DILATION;  Surgeon: Inda Castle, MD;  Location: [facility];  Service: Endoscopy;  Laterality: N/A;  . Esophagogastroduodenoscopy N/A 12/12/2014    Procedure: ESOPHAGOGASTRODUODENOSCOPY (EGD);   Surgeon: Inda Castle, MD;  [facility];  Service: Endoscopy;  Laterality: N/A;  with dilation  . Balloon dilation N/A 12/12/2014    Procedure: BALLOON DILATION;  Surgeon: Inda Castle, MD;  Location[facility];  Service: Endoscopy;  Laterality: N/A;  . Orif ankle fracture Right 07/04/2015    Procedure: OPEN REDUCTION INTERNAL FIXATION (ORIF) ANKLE FRACTURE;  Surgeon: Newt Minion, MD;  Location: [facility];  Service: Orthopedics;  Laterality: Right;  OPEN REDUCTION, INTERNAL FIXATION OF RIGHT ANKLE FRACTURE.    Family History  Problem Relation Age of Onset  . Hypertension Other   . Stroke Other   . Heart attack Other   . Hemachromatosis Other   . Rheum arthritis      both sets of grandparents and father  . Other Mother   . Other Father     failure to thrive   Social History  Substance Use Topics  . Smoking status: Former Smoker -- 3.00 packs/day for 45 years    Types: Cigarettes    Quit date: 12/05/1997  . Smokeless tobacco: Never Used  . Alcohol Use: No   OB History    No data available     Review of Systems  All other systems reviewed and are negative.     Allergies  Remicade; Carboplatin; Shellfish-derived products; and Hydromorphone  Home Medications   Prior to Admission medications   Medication Sig Start Date End Date Taking?  Authorizing Provider  DULoxetine (CYMBALTA) 60 MG capsule Take 1 capsule (60 mg total) by mouth 2 (two) times daily. 08/12/15  Yes Melvenia Beam, MD  fluticasone (CUTIVATE) 0.05 % cream Apply 1 application topically 2 (two) times daily as needed (rash).    Yes Historical Provider, MD  gabapentin (NEURONTIN) 600 MG tablet 600 mg 3 (three) times daily. Reported on 11/17/2015 08/13/15  Yes Historical Provider, MD  HUMALOG KWIKPEN 100 UNIT/ML KiwkPen Inject 6 Units into the skin 3 (three) times daily.  10/07/15  Yes Historical Provider, MD  insulin detemir (LEVEMIR) 100 UNIT/ML injection Inject 26 Units into the skin at bedtime.     Yes Historical Provider, MD  losartan-hydrochlorothiazide (HYZAAR) 100-25 MG tablet Take 1 tablet by mouth daily. 10/13/15  Yes Historical Provider, MD  nystatin-triamcinolone ointment (MYCOLOG) Apply 1 application topically 2 (two) times daily. 10/20/15  Yes Historical Provider, MD  potassium chloride SA (K-DUR,KLOR-CON) 20 MEQ tablet Take 40 mEq by mouth daily.   Yes Historical Provider, MD  B-D ULTRAFINE III SHORT PEN 31G X 8 MM MISC  10/28/14   Historical Provider, MD  Fluticasone-Salmeterol (ADVAIR DISKUS) 100-50 MCG/DOSE AEPB Inhale 1 puff into the lungs 2 (two) times daily. Patient taking differently: Inhale 1 puff into the lungs 2 (two) times daily as needed (sob and wheezing).  08/27/14   Brand Males, MD  HYDROcodone-acetaminophen (NORCO/VICODIN) 5-325 MG tablet Take 1-2 tablets by mouth every 6 (six) hours as needed. Patient not taking: Reported on 11/27/2015 08/01/15   Lahoma Crocker Barrett, PA-C  ibuprofen (ADVIL,MOTRIN) 600 MG tablet Take 1 tablet (600 mg total) by mouth every 6 (six) hours as needed. Take with food. Patient not taking: Reported on 11/27/2015 10/06/14   Curt Bears, MD  [place] LANCETS FINE [place]  02/04/15   Historical Provider, MD   BP 91/62 mmHg  Pulse 115  Temp(Src) 97.5 F (36.4 C)  Resp 16  SpO2 100% Physical Exam  Constitutional: She is oriented to person, place, and time. She appears well-developed.  Elderly, frail  HENT:  Head: Normocephalic and atraumatic.  Right Ear: External ear normal.  Left Ear: External ear normal.  Eyes: Conjunctivae and EOM are normal. Pupils are equal, round, and reactive to light.  Neck: Normal range of motion and phonation normal. Neck supple.  Cardiovascular: Normal rate, regular rhythm and normal heart sounds.   Pulmonary/Chest: Effort normal and breath sounds normal. She exhibits no bony tenderness.  Abdominal: Soft. Bowel sounds are normal. She exhibits no distension. There is no tenderness.  Genitourinary:  There  are 3 areas of fissuring of the external anus, which are not actively bleeding at this time. There is a small amount of thin brown stool in the rectal vault. There is no external or palpable internal hemorrhoid.  Musculoskeletal: Normal range of motion.  Neurological: She is alert and oriented to person, place, and time. No cranial nerve deficit or sensory deficit. She exhibits normal muscle tone. Coordination normal.  Skin: Skin is warm, dry and intact.  Psychiatric: She has a normal mood and affect. Her behavior is normal. Judgment and thought content normal.  Nursing note and vitals reviewed.   ED Course  Procedures (including critical care time) Medications - No data to display  Patient Vitals for the past 24 hrs:  BP Temp Pulse Resp SpO2  11/27/15 1324 91/62 mmHg - 115 16 100 %  11/27/15 1036 130/73 mmHg 97.5 F (36.4 C) 114 - 93 %    3:27 PM Reevaluation with  update and discussion. After initial assessment and treatment, an updated evaluation reveals she feels better at this time. She has continued to have some mild diarrhea here. Findings discussed with patient, all questions answered. Lenoria Narine L     Labs Review Labs Reviewed  BASIC METABOLIC PANEL - Abnormal; Notable for the following:    Sodium 134 (*)    Chloride 96 (*)    Glucose, Bld 386 (*)    All other components within normal limits  CBC WITH DIFFERENTIAL/PLATELET - Abnormal; Notable for the following:    Monocytes Absolute 1.1 (*)    All other components within normal limits  POC OCCULT BLOOD, ED - Abnormal; Notable for the following:    Fecal Occult Bld POSITIVE (*)    All other components within normal limits    Imaging Review No results found. I have personally reviewed and evaluated these images and lab results as part of my medical decision-making.   EKG Interpretation None      MDM   Final diagnoses:  Diarrhea, unspecified type    Nonspecific diarrhea related to recent immunotherapy,  doubt infectious cause, or food toxin. Improved with IV fluids here. She has been getting relief with Imodium at home, so can continue that.  Nursing Notes Reviewed/ Care Coordinated Applicable Imaging Reviewed Interpretation of Laboratory Data incorporated into ED treatment  The patient appears reasonably screened and/or stabilized for discharge and I doubt any other medical condition or other [facility] requiring further screening, evaluation, or treatment in the ED at this time prior to discharge.  Plan: Home Medications- Immodium; Home Treatments- rest, Fluids; return here if the recommended treatment, does not improve the symptoms; Recommended follow up- PCP prn   Daleen Bo, MD 11/27/15 1528

## 2015-11-27 NOTE — ED Notes (Signed)
Patient incontinent of small amount of loose stool.  Peri-care completed and patient changed into clean brief.

## 2015-11-27 NOTE — Discharge Instructions (Signed)
Diarrhea Diarrhea is watery poop (stool). It can make you feel weak, tired, thirsty, or give you a dry mouth (signs of dehydration). Watery poop is a sign of another problem, most often an infection. It often lasts 2-3 days. It can last longer if it is a sign of something serious. Take care of yourself as told by your doctor. HOME CARE   Drink 1 cup (8 ounces) of fluid each time you have watery poop.  Do not drink the following fluids:  Those that contain simple sugars (fructose, glucose, galactose, lactose, sucrose, maltose).  Sports drinks.  Fruit juices.  Whole milk products.  Sodas.  Drinks with caffeine (coffee, tea, soda) or alcohol.  Oral rehydration solution may be used if the doctor says it is okay. You may make your own solution. Follow this recipe:   - teaspoon table salt.   teaspoon baking soda.   teaspoon salt substitute containing potassium chloride.  1 tablespoons sugar.  1 liter (34 ounces) of water.  Avoid the following foods:  High fiber foods, such as raw fruits and vegetables.  Nuts, seeds, and whole grain breads and cereals.   Those that are sweetened with sugar alcohols (xylitol, sorbitol, mannitol).  Try eating the following foods:  Starchy foods, such as rice, toast, pasta, low-sugar cereal, oatmeal, baked potatoes, crackers, and bagels.  Bananas.  Applesauce.  Eat probiotic-rich foods, such as yogurt and milk products that are fermented.  Wash your hands well after each time you have watery poop.  Only take medicine as told by your doctor.  Take a warm bath to help lessen burning or pain from having watery poop. GET HELP RIGHT AWAY IF:   You cannot drink fluids without throwing up (vomiting).  You keep throwing up.  You have blood in your poop, or your poop looks black and tarry.  You do not pee (urinate) in 6-8 hours, or there is only a small amount of very dark pee.  You have belly (abdominal) pain that gets worse or stays  in the same spot (localizes).  You are weak, dizzy, confused, or light-headed.  You have a very bad headache.  Your watery poop gets worse or does not get better.  You have a fever or lasting symptoms for more than 2-3 days.  You have a fever and your symptoms suddenly get worse. MAKE SURE YOU:   Understand these instructions.  Will watch your condition.  Will get help right away if you are not doing well or get worse.   This information is not intended to replace advice given to you by your health care provider. Make sure you discuss any questions you have with your health care provider.   Document Released: 04/04/2008 Document Revised: 11/07/2014 Document Reviewed: 06/24/2012 Elsevier Interactive Patient Education 2016 [place] Choices to Help Relieve Diarrhea, Adult When you have diarrhea, the foods you eat and your eating habits are very important. Choosing the right foods and drinks can help relieve diarrhea. Also, because diarrhea can last up to 7 days, you need to replace lost fluids and electrolytes (such as sodium, potassium, and chloride) in order to help prevent dehydration.  WHAT GENERAL GUIDELINES DO I NEED TO FOLLOW?  Slowly drink 1 cup (8 oz) of fluid for each episode of diarrhea. If you are getting enough fluid, your urine will be clear or pale yellow.  Eat starchy foods. Some good choices include white rice, white toast, pasta, low-fiber cereal, baked potatoes (without the skin), saltine  crackers, and bagels.  Avoid large servings of any cooked vegetables.  Limit fruit to two servings per day. A serving is  cup or 1 small piece.  Choose foods with less than 2 g of fiber per serving.  Limit fats to less than 8 tsp (38 g) per day.  Avoid fried foods.  Eat foods that have probiotics in them. Probiotics can be found in certain dairy products.  Avoid foods and beverages that may increase the speed at which food moves through the stomach and  intestines (gastrointestinal tract). Things to avoid include:  High-fiber foods, such as dried fruit, raw fruits and vegetables, nuts, seeds, and whole grain foods.  Spicy foods and high-fat foods.  Foods and beverages sweetened with high-fructose corn syrup, honey, or sugar alcohols such as xylitol, sorbitol, and mannitol. WHAT FOODS ARE RECOMMENDED? Grains White rice. White, [place], or pita breads (fresh or toasted), including plain rolls, buns, or bagels. White pasta. Saltine, soda, or graham crackers. Pretzels. Low-fiber cereal. Cooked cereals made with water (such as cornmeal, farina, or cream cereals). Plain muffins. Matzo. Melba toast. Zwieback.  Vegetables Potatoes (without the skin). Strained tomato and vegetable juices. Most well-cooked and canned vegetables without seeds. Tender lettuce. Fruits Cooked or canned applesauce, apricots, cherries, fruit cocktail, grapefruit, peaches, pears, or plums. Fresh bananas, apples without skin, cherries, grapes, cantaloupe, grapefruit, peaches, oranges, or plums.  Meat and Other Protein Products Baked or boiled chicken. Eggs. Tofu. Fish. Seafood. Smooth peanut butter. Ground or well-cooked tender beef, ham, veal, lamb, pork, or poultry.  Dairy Plain yogurt, kefir, and unsweetened liquid yogurt. Lactose-free milk, buttermilk, or soy milk. Plain hard cheese. Beverages Sport drinks. Clear broths. Diluted fruit juices (except prune). Regular, caffeine-free sodas such as ginger ale. Water. Decaffeinated teas. Oral rehydration solutions. Sugar-free beverages not sweetened with sugar alcohols. Other Bouillon, broth, or soups made from recommended foods.  The items listed above may not be a complete list of recommended foods or beverages. Contact your dietitian for more options. WHAT FOODS ARE NOT RECOMMENDED? Grains Whole grain, whole wheat, bran, or rye breads, rolls, pastas, crackers, and cereals. Wild or brown rice. Cereals that contain more than 2  g of fiber per serving. Corn tortillas or taco shells. Cooked or dry oatmeal. Granola. Popcorn. Vegetables Raw vegetables. Cabbage, broccoli, Brussels sprouts, artichokes, baked beans, beet greens, corn, kale, legumes, peas, sweet potatoes, and yams. Potato skins. Cooked spinach and cabbage. Fruits Dried fruit, including raisins and dates. Raw fruits. Stewed or dried prunes. Fresh apples with skin, apricots, mangoes, pears, raspberries, and strawberries.  Meat and Other Protein Products Chunky peanut butter. Nuts and seeds. Beans and lentils. Berniece Salines.  Dairy High-fat cheeses. Milk, chocolate milk, and beverages made with milk, such as milk shakes. Cream. Ice cream. Sweets and Desserts Sweet rolls, doughnuts, and sweet breads. Pancakes and waffles. Fats and Oils Butter. Cream sauces. Margarine. Salad oils. Plain salad dressings. Olives. Avocados.  Beverages Caffeinated beverages (such as coffee, tea, soda, or energy drinks). Alcoholic beverages. Fruit juices with pulp. Prune juice. Soft drinks sweetened with high-fructose corn syrup or sugar alcohols. Other Coconut. Hot sauce. Chili powder. Mayonnaise. Gravy. Cream-based or milk-based soups.  The items listed above may not be a complete list of foods and beverages to avoid. Contact your dietitian for more information. WHAT SHOULD I DO IF I BECOME DEHYDRATED? Diarrhea can sometimes lead to dehydration. Signs of dehydration include dark urine and dry mouth and skin. If you think you are dehydrated, you should rehydrate with an  oral rehydration solution. These solutions can be purchased at pharmacies, retail stores, or online.  Drink -1 cup (120-240 mL) of oral rehydration solution each time you have an episode of diarrhea. If drinking this amount makes your diarrhea worse, try drinking smaller amounts more often. For example, drink 1-3 tsp (5-15 mL) every 5-10 minutes.  A general rule for staying hydrated is to drink 1-2 L of fluid per day. Talk to  your health care provider about the specific amount you should be drinking each day. Drink enough fluids to keep your urine clear or pale yellow.   This information is not intended to replace advice given to you by your health care provider. Make sure you discuss any questions you have with your health care provider.   Document Released: 01/07/2004 Document Revised: 11/07/2014 Document Reviewed: 09/09/2013 Elsevier Interactive Patient Education [employer].

## 2015-11-27 NOTE — ED Notes (Signed)
Patient c/o watery diarrhea x 3days.  Patient also c/o very mild lower abdominal pain. Patient states she had scant amounts of bright red blood in stool this morning but states it is from irritation of rectum - patient states she has hx of same.  Patient states she is being treated with immunotherapy for lung cancer - last treatment 9 days ago. Patient reports she had similar episode previously and states once her immunotherapy stopped, her diarrhea resolved.  Patient denies other complaints.

## 2015-11-27 NOTE — Telephone Encounter (Signed)
PA pending. Having to fill out appeal letter.

## 2015-11-27 NOTE — ED Notes (Addendum)
Pt walked to the door in the room. Pt states she has a broken foot and has had ankle surgery 07/2015

## 2015-11-27 NOTE — ED Notes (Signed)
Pt has been drinking diet pepsi since coming to room

## 2015-11-30 ENCOUNTER — Ambulatory Visit (HOSPITAL_COMMUNITY)
Admission: RE | Admit: 2015-11-30 | Discharge: 2015-11-30 | Disposition: A | Discharge status: Home / Self Care | Payer: Medicare Other | Source: Ambulatory Visit | Attending: Internal Medicine | Admitting: Internal Medicine

## 2015-11-30 ENCOUNTER — Encounter (HOSPITAL_COMMUNITY): Payer: Self-pay

## 2015-11-30 DIAGNOSIS — Z5112 Encounter for antineoplastic immunotherapy: Secondary | ICD-10-CM | POA: Diagnosis not present

## 2015-11-30 DIAGNOSIS — R0602 Shortness of breath: Secondary | ICD-10-CM | POA: Insufficient documentation

## 2015-11-30 DIAGNOSIS — I2510 Atherosclerotic heart disease of native coronary artery without angina pectoris: Secondary | ICD-10-CM | POA: Insufficient documentation

## 2015-11-30 DIAGNOSIS — R05 Cough: Secondary | ICD-10-CM | POA: Diagnosis not present

## 2015-11-30 DIAGNOSIS — C3411 Malignant neoplasm of upper lobe, right bronchus or lung: Secondary | ICD-10-CM | POA: Insufficient documentation

## 2015-11-30 DIAGNOSIS — R918 Other nonspecific abnormal finding of lung field: Secondary | ICD-10-CM | POA: Insufficient documentation

## 2015-11-30 DIAGNOSIS — C342 Malignant neoplasm of middle lobe, bronchus or lung: Secondary | ICD-10-CM | POA: Diagnosis not present

## 2015-11-30 DIAGNOSIS — J9819 Other pulmonary collapse: Secondary | ICD-10-CM | POA: Diagnosis not present

## 2015-11-30 MED ORDER — IOHEXOL 300 MG/ML  SOLN
75.0000 mL | Freq: Once | INTRAMUSCULAR | Status: AC | PRN
  Administered 2015-11-30: 75 mL via INTRAVENOUS

## 2015-12-01 ENCOUNTER — Other Ambulatory Visit (HOSPITAL_BASED_OUTPATIENT_CLINIC_OR_DEPARTMENT_OTHER): Payer: Medicare Other

## 2015-12-01 ENCOUNTER — Telehealth: Payer: Self-pay | Admitting: Internal Medicine

## 2015-12-01 ENCOUNTER — Ambulatory Visit: Payer: Medicare Other

## 2015-12-01 ENCOUNTER — Ambulatory Visit (HOSPITAL_BASED_OUTPATIENT_CLINIC_OR_DEPARTMENT_OTHER): Payer: Medicare Other | Admitting: Internal Medicine

## 2015-12-01 ENCOUNTER — Encounter: Payer: Self-pay | Admitting: Internal Medicine

## 2015-12-01 ENCOUNTER — Telehealth: Payer: Self-pay | Admitting: Neurology

## 2015-12-01 VITALS — BP 122/63 | HR 120 | Temp 97.9°F | Resp 18 | Ht 66.0 in | Wt 136.1 lb

## 2015-12-01 DIAGNOSIS — L409 Psoriasis, unspecified: Secondary | ICD-10-CM

## 2015-12-01 DIAGNOSIS — Z5112 Encounter for antineoplastic immunotherapy: Secondary | ICD-10-CM

## 2015-12-01 DIAGNOSIS — C3411 Malignant neoplasm of upper lobe, right bronchus or lung: Secondary | ICD-10-CM

## 2015-12-01 DIAGNOSIS — C3492 Malignant neoplasm of unspecified part of left bronchus or lung: Secondary | ICD-10-CM

## 2015-12-01 DIAGNOSIS — R21 Rash and other nonspecific skin eruption: Secondary | ICD-10-CM | POA: Diagnosis not present

## 2015-12-01 DIAGNOSIS — E1165 Type 2 diabetes mellitus with hyperglycemia: Secondary | ICD-10-CM

## 2015-12-01 DIAGNOSIS — R197 Diarrhea, unspecified: Secondary | ICD-10-CM

## 2015-12-01 DIAGNOSIS — C342 Malignant neoplasm of middle lobe, bronchus or lung: Secondary | ICD-10-CM

## 2015-12-01 LAB — CBC WITH DIFFERENTIAL/PLATELET
BASO%: 2.5 % — AB (ref 0.0–2.0)
BASOS ABS: 0.3 10*3/uL — AB (ref 0.0–0.1)
EOS%: 17.8 % — ABNORMAL HIGH (ref 0.0–7.0)
Eosinophils Absolute: 1.9 10*3/uL — ABNORMAL HIGH (ref 0.0–0.5)
HEMATOCRIT: 42.4 % (ref 34.8–46.6)
HGB: 14.6 g/dL (ref 11.6–15.9)
LYMPH#: 1.8 10*3/uL (ref 0.9–3.3)
LYMPH%: 16.9 % (ref 14.0–49.7)
MCH: 29.9 pg (ref 25.1–34.0)
MCHC: 34.4 g/dL (ref 31.5–36.0)
MCV: 86.9 fL (ref 79.5–101.0)
MONO#: 1.4 10*3/uL — AB (ref 0.1–0.9)
MONO%: 12.4 % (ref 0.0–14.0)
NEUT#: 5.5 10*3/uL (ref 1.5–6.5)
NEUT%: 50.4 % (ref 38.4–76.8)
Platelets: 264 10*3/uL (ref 145–400)
RBC: 4.88 10*6/uL (ref 3.70–5.45)
RDW: 14.8 % — ABNORMAL HIGH (ref 11.2–14.5)
WBC: 10.9 10*3/uL — ABNORMAL HIGH (ref 3.9–10.3)

## 2015-12-01 LAB — TECHNOLOGIST REVIEW

## 2015-12-01 LAB — COMPREHENSIVE METABOLIC PANEL
ALT: 13 U/L (ref 0–55)
ANION GAP: 13 meq/L — AB (ref 3–11)
AST: 10 U/L (ref 5–34)
Albumin: 2.8 g/dL — ABNORMAL LOW (ref 3.5–5.0)
Alkaline Phosphatase: 156 U/L — ABNORMAL HIGH (ref 40–150)
BUN: 13.6 mg/dL (ref 7.0–26.0)
CALCIUM: 8.8 mg/dL (ref 8.4–10.4)
CHLORIDE: 102 meq/L (ref 98–109)
CO2: 22 mEq/L (ref 22–29)
Creatinine: 0.9 mg/dL (ref 0.6–1.1)
EGFR: 64 mL/min/{1.73_m2} — ABNORMAL LOW (ref >90)
GLUCOSE: 98 mg/dL (ref 70–140)
Potassium: 3.5 mEq/L (ref 3.5–5.1)
Sodium: 136 mEq/L (ref 136–145)
Total Bilirubin: 0.43 mg/dL (ref 0.20–1.20)
Total Protein: 5.7 g/dL — ABNORMAL LOW (ref 6.4–8.3)

## 2015-12-01 NOTE — Telephone Encounter (Signed)
Called and spoke to [name]. He looked up reason appeal was denied. Stated per Medical director: [name] is not FDA approved for her diagnosis. Only for shingles. [employer] will not allow this. Appeal letter was received and they still denied request. Gave fax number [phone] to have them fax letter to as well. Letter will not be generated and sent to our office for another couple days.

## 2015-12-01 NOTE — Telephone Encounter (Signed)
Appeal letter sent. Waiting on reply.

## 2015-12-01 NOTE — Telephone Encounter (Signed)
Pt confirmed labs/ov per 01/31 POF, gave pt AVS and Calendar... KJ

## 2015-12-01 NOTE — Progress Notes (Signed)
[name] [phone]   [phone]  OFFICE PROGRESS NOTE  Megan Bolt, MD [address] [name] [address] [name] [id number]  DIAGNOSIS: Squamous cell lung cancer  Primary site: Lung (Right)  Staging method: AJCC 7th Edition  Clinical: Stage IIIB (T3, N3, M0)  Summary: Stage IIIB (T3, N3, M0)   PRIOR THERAPY:  1) Concurrent chemoradiation with weekly chemotherapy in the form of carboplatin for AUC of 2 and paclitaxel 45 mg/m2. Status post 7 week of treatment. Last dose was given 01/27/2014 no significant response in her disease.  2) Consolidation chemotherapy with carboplatin for AUC of 5 and paclitaxel 175 mg/M2 every 3 weeks with Neulasta support. First dose on 04/15/2014. Status post 3 cycles. Carboplatin was discontinued starting cycle #2 secondary to hypersensitivity reaction.  CURRENT THERAPY: Immunotherapy with Opdivo (Nivolumab) '3mg'$ /kg given every 2 weeks. Status post 33 cycles.  CHEMOTHERAPY INTENT: Control/Palliative  CURRENT # OF CHEMOTHERAPY CYCLES: 33 CURRENT ANTIEMETICS: Zofran, dexamethasone, Compazine  CURRENT SMOKING STATUS: Former smoker, quit 12/05/1997  ORAL CHEMOTHERAPY AND CONSENT: n/a  CURRENT BISPHOSPHONATES USE: None  PAIN MANAGEMENT: no cancer related pain, patient does have RA  NARCOTICS INDUCED CONSTIPATION: none  LIVING WILL AND CODE STATUS: ?  INTERVAL HISTORY: [name] 75 y.o. female returns to the clinic today for follow-up visit accompanied by friend. The patient is currently on immunotherapy with Nivolumab status post 33 cycles. The patient has been tolerating her immunotherapy fairly well with no significant complaints except for few areas of macular rash on the arms & thighs. She also started having 4-5 episodes of diarrhea every day for the last 2 weeks. She has no diarrhea this morning. She continued to have significant hyperglycemia. She denied having any significant  weight loss or night sweats. The patient denied having any chest pain, shortness of breath, cough or hemoptysis. She has no significant nausea or vomiting, no fever or chills. She had repeat CT scan of the chest performed recently and she is here for evaluation and discussion of her scan results.  MEDICAL HISTORY: Past Medical History  Diagnosis Date  . Hypertension   . Rheumatoid arthritis ([place])   . GERD (gastroesophageal reflux disease)     no meds for  . Hx of radiation therapy 12/16/13-01/30/14    lung 66Gy  . Neuropathy ([place])   . Shortness of breath dyspnea     with exertion  . History of hiatal hernia   . Diabetes mellitus     insulin  . Cancer ([name]) dx'd 09/2013    Lung ca  . Malignant neoplasm of right upper lobe of lung ([place]) 11/28/2013    ALLERGIES:  is allergic to remicade; carboplatin; shellfish-derived products; and hydromorphone.  MEDICATIONS:  Current Outpatient Prescriptions  Medication Sig Dispense Refill  . loperamide (IMODIUM) 2 MG capsule Take 2 mg by mouth as needed for diarrhea or loose stools.    . B-D ULTRAFINE III SHORT PEN 31G X 8 MM MISC   0  . DULoxetine (CYMBALTA) 60 MG capsule Take 1 capsule (60 mg total) by mouth 2 (two) times daily. 60 capsule 11  . fluticasone (CUTIVATE) 0.05 % cream Apply 1 application topically 2 (two) times daily as needed (rash).     . Fluticasone-Salmeterol (ADVAIR DISKUS) 100-50 MCG/DOSE AEPB Inhale 1 puff into the lungs 2 (two) times daily. (Patient taking differently: Inhale 1 puff into the lungs 2 (two) times daily as needed (sob and wheezing). ) 60 each 5  .  gabapentin (NEURONTIN) 600 MG tablet 600 mg 3 (three) times daily. Reported on 11/17/2015    . HUMALOG KWIKPEN 100 UNIT/ML KiwkPen Inject 6 Units into the skin 3 (three) times daily.   11  . HYDROcodone-acetaminophen (NORCO/VICODIN) 5-325 MG tablet Take 1-2 tablets by mouth every 6 (six) hours as needed. (Patient not taking: Reported on 11/27/2015) 10 tablet 0  . ibuprofen  (ADVIL,MOTRIN) 600 MG tablet Take 1 tablet (600 mg total) by mouth every 6 (six) hours as needed. Take with food. (Patient not taking: Reported on 11/27/2015) 30 tablet 0  . insulin detemir (LEVEMIR) 100 UNIT/ML injection Inject 26 Units into the skin at bedtime.     [name] LEVEMIR FLEXTOUCH 100 UNIT/ML Pen INJECT 30 UNITS INTO THE SKIN ONCE D  0  . losartan-hydrochlorothiazide (HYZAAR) 100-25 MG tablet Take 1 tablet by mouth daily.    [name] nystatin-triamcinolone ointment (MYCOLOG) Apply 1 application topically 2 (two) times daily.    [name] DELICA LANCETS FINE MISC   0  . potassium chloride SA (K-DUR,KLOR-CON) 20 MEQ tablet Take 40 mEq by mouth daily.     No current facility-administered medications for this visit.   Facility-Administered Medications Ordered in Other Visits  Medication Dose Route Frequency Provider Last Rate Last Dose  . sodium chloride 0.9 % injection 10 mL  10 mL Intracatheter PRN Curt Bears, MD   10 mL at 12/30/13 1121    SURGICAL HISTORY:  Past Surgical History  Procedure Laterality Date  . Cholecystectomy    . Abdominal hysterectomy    . Dilation and curettage of uterus    . Abdominal surgery    . Tonsillectomy    . Esophagogastroduodenoscopy (egd) with propofol N/A 10/09/2014    Procedure: ESOPHAGOGASTRODUODENOSCOPY (EGD) WITH PROPOFOL;  Surgeon: Inda Castle, MD;  Location: [facility];  Service: Endoscopy;  Laterality: N/A;  . Savory dilation N/A 10/09/2014    Procedure: SAVORY DILATION;  Surgeon: Inda Castle, MD;  Location: [facility];  Service: Endoscopy;  Laterality: N/A;  . Esophagogastroduodenoscopy N/A 12/12/2014    Procedure: ESOPHAGOGASTRODUODENOSCOPY (EGD);  Surgeon: Inda Castle, MD;  [name];  Service: Endoscopy;  Laterality: N/A;  with dilation  . Balloon dilation N/A 12/12/2014    Procedure: BALLOON DILATION;  Surgeon: Inda Castle, MD;  Location: [facility];  Service: Endoscopy;  Laterality: N/A;  . Orif ankle  fracture Right 07/04/2015    Procedure: OPEN REDUCTION INTERNAL FIXATION (ORIF) ANKLE FRACTURE;  Surgeon: Newt Minion, MD;  [name];  Service: Orthopedics;  Laterality: Right;  OPEN REDUCTION, INTERNAL FIXATION OF RIGHT ANKLE FRACTURE.     REVIEW OF SYSTEMS:  Constitutional: positive for fatigue Eyes: negative Ears, nose, mouth, throat, and face: negative Respiratory: negative Cardiovascular: negative Gastrointestinal: negative Genitourinary:negative Integument/breast: positive for rash Hematologic/lymphatic: negative Musculoskeletal:negative Neurological: negative Behavioral/Psych: negative Endocrine: negative Allergic/Immunologic: negative   PHYSICAL EXAMINATION: General appearance: alert, cooperative, fatigued and no distress Head: Normocephalic, without obvious abnormality, atraumatic Neck: no adenopathy, no JVD, supple, symmetrical, trachea midline and thyroid not enlarged, symmetric, no tenderness/mass/nodules Lymph nodes: Cervical, supraclavicular, and axillary nodes normal. Resp: clear to auscultation bilaterally Back: symmetric, no curvature. ROM normal. No CVA tenderness. Cardio: regular rate and rhythm, S1, S2 normal, no murmur, click, rub or gallop GI: soft, non-tender; bowel sounds normal; no masses,  no organomegaly Extremities: extremities normal, atraumatic, no cyanosis or edema Neurologic: Alert and oriented X 3, normal strength and tone. Normal symmetric reflexes. Normal coordination and gait  Skin exam: few  macular areas of rash on the upper and lower extremities.     ECOG PERFORMANCE STATUS: 1 - Symptomatic but completely ambulatory  Blood pressure 122/63, pulse 120, temperature 97.9 F (36.6 C), temperature source Oral, resp. rate 18, height '5\' 6"'$  (1.676 m), weight 136 lb 1.6 oz (61.735 kg), SpO2 98 %.  LABORATORY DATA: Lab Results  Component Value Date   WBC 10.9* 12/01/2015   HGB 14.6 12/01/2015   HCT 42.4 12/01/2015   MCV 86.9 12/01/2015    PLT 264 12/01/2015      Chemistry      Component Value Date/Time   NA 136 12/01/2015 1104   NA 134* 11/27/2015 1136   K 3.5 12/01/2015 1104   K 4.2 11/27/2015 1136   CL 96* 11/27/2015 1136   CO2 22 12/01/2015 1104   CO2 25 11/27/2015 1136   BUN 13.6 12/01/2015 1104   BUN 14 11/27/2015 1136   CREATININE 0.9 12/01/2015 1104   CREATININE 0.91 11/27/2015 1136   GLU 299* 02/02/2015 1348      Component Value Date/Time   CALCIUM 8.8 12/01/2015 1104   CALCIUM 9.0 11/27/2015 1136   ALKPHOS 156* 12/01/2015 1104   ALKPHOS 87 07/04/2015 0649   AST 10 12/01/2015 1104   AST 17 07/04/2015 0649   ALT 13 12/01/2015 1104   ALT 12* 07/04/2015 0649   BILITOT 0.43 12/01/2015 1104   BILITOT 0.3 07/04/2015 0649       RADIOGRAPHIC STUDIES: Ct Chest W Contrast  11/30/2015  CLINICAL DATA:  Right lung cancer, Opdivo therapy in progress, radiation therapy complete. Cough and shortness of breath for 1-1/2 years. EXAM: CT CHEST WITH CONTRAST TECHNIQUE: Multidetector CT imaging of the chest was performed during intravenous contrast administration. CONTRAST:  76m OMNIPAQUE IOHEXOL 300 MG/ML  SOLN COMPARISON:  09/15/2015 and 06/29/2015. FINDINGS: Mediastinum/Nodes: No pathologically enlarged mediastinal, hilar or axillary lymph nodes. Calcified left hilar lymph nodes. Heart size normal. Coronary artery calcification. No pericardial effusion. Lungs/Pleura: Chronic collapse/consolidation involving the right upper and right middle lobes, stable. Architectural distortion in the right lower lobe, posteromedially, likely treatment related. Scattered ground-glass nodularity in the left lung is somewhat amorphous, making discrete measurement difficult. Overall appearance is stable. No pleural fluid. Upper abdomen: Low-attenuation lesions in the liver measure up to 1.4 cm, stable. Biliary ductal dilatation is also stable. Cholecystectomy. Visualized portions of the adrenal glands, kidneys, spleen, pancreas, stomach and  bowel are grossly unremarkable. No upper abdominal adenopathy. Musculoskeletal: No worrisome lytic or sclerotic lesions. Degenerative changes are seen in the spine. IMPRESSION: 1. Chronic collapse/consolidation in the right upper and right middle lobes with architectural distortion in the right lower lobe, likely treatment related. Findings are unchanged from recent prior examinations. 2. Scattered and somewhat amorphous ground-glass nodularity in the left lung, also stable. 3. Coronary artery calcification. Electronically Signed   By: MLorin PicketM.D.   On: 11/30/2015 08:55    ASSESSMENT AND PLAN: This is a very pleasant 75years old white female with metastatic non-small cell lung cancer, squamous cell carcinoma who is currently undergoing treatment with immunotherapy with Nivolumab status post 33 cycles and tolerating her treatment fairly well except for recently several episodes of diarrhea concerning to be immune mediated from her treatment. This completely resolved after the patient was treated with prednisone that has been tapered. The recent CT scan of the chest showed no evidence for disease progression. The patient continues to have persistent skin rash and now few episodes of diarrhea on daily basis for  the last 2 weeks. I gave her the option of continuing treatment with immunotherapy next week versus taking a break off treatment for the next 3 months and close monitoring. The patient is interested in observation at this point. I would see her back for follow-up visit in 3 months for reevaluation after repeating CT scan of the chest for restaging of her disease. She continues to have uncontrolled hyperglycemia. I recommended for the patient to contact her primary care physician for adjustment of her diabetic medications. For the skin rash, this is most likely secondary to her treatment but the patient was also diagnosed with psoriasis and her dermatologist is treating her rash as psoriasis.  We will continue to monitor this macular skin rash closely. The patient was advised to call immediately if she has any concerning symptoms in the interval. The patient voices understanding of current disease status and treatment options and is in agreement with the current care plan.  All questions were answered. The patient knows to call the clinic with any problems, questions or concerns. We can certainly see the patient much sooner if necessary.  Disclaimer: This note was dictated with voice recognition software. Similar sounding words can inadvertently be transcribed and may not be corrected upon review.

## 2015-12-01 NOTE — Telephone Encounter (Signed)
[employer] called to say pts appeal for gabapentin (NEURONTIN) 600 MG tablet has been denied and a letter has been sent to our office to explain. [phone]

## 2015-12-04 NOTE — Telephone Encounter (Signed)
Received denial letter for Gralise from [employer]. Letter dated 12/03/15.

## 2015-12-11 ENCOUNTER — Other Ambulatory Visit: Payer: Self-pay | Admitting: Neurology

## 2015-12-11 DIAGNOSIS — G2581 Restless legs syndrome: Secondary | ICD-10-CM | POA: Insufficient documentation

## 2015-12-11 NOTE — Telephone Encounter (Signed)
THANK YOU

## 2015-12-11 NOTE — Telephone Encounter (Signed)
Pt called back and is agreeable to a medication change. Please send to [place]. Thank you

## 2015-12-11 NOTE — Telephone Encounter (Signed)
Megan [name] - I have been trying to figure out what to do about this. Would you call patient and let her know what is going on with the gralise? They have denied it and and they also denied our secondary appeal.  In the meantime there is another equivalent long-acting gabapentin called Horizant that I am going to try and prescribe for patient to take the place of Gralise. Would you call patient and see if it is acceptable if we try to replace her Gralise with a similar medication that she takes at night called Horizant? ?

## 2015-12-11 NOTE — Telephone Encounter (Signed)
I called the patient to discuss info from notes below.  Got no answer.  Left message.  Asked that she please call us back to advise if she would be agreeable to drug change.  (Advised office closes at 26 today)

## 2015-12-14 MED ORDER — GABAPENTIN ENACARBIL ER 600 MG PO TBCR: 1200.0000 mg | EXTENDED_RELEASE_TABLET | Freq: Every day | ORAL | Status: DC

## 2015-12-14 NOTE — Telephone Encounter (Signed)
[facility]) has approved the request for coverage on Horizant (max 2 tabs daily) effective until 10/30/2016 Ref # [id number]

## 2015-12-15 ENCOUNTER — Other Ambulatory Visit: Payer: Medicare Other

## 2015-12-15 ENCOUNTER — Ambulatory Visit: Payer: Medicare Other | Admitting: Internal Medicine

## 2015-12-15 ENCOUNTER — Ambulatory Visit: Payer: Medicare Other

## 2015-12-29 ENCOUNTER — Other Ambulatory Visit: Payer: Medicare Other

## 2015-12-29 ENCOUNTER — Ambulatory Visit: Payer: Medicare Other

## 2015-12-29 ENCOUNTER — Ambulatory Visit: Payer: Medicare Other | Admitting: Internal Medicine

## 2016-01-12 ENCOUNTER — Ambulatory Visit: Payer: Medicare Other | Admitting: Internal Medicine

## 2016-01-12 ENCOUNTER — Ambulatory Visit: Payer: Medicare Other

## 2016-01-12 ENCOUNTER — Other Ambulatory Visit: Payer: Medicare Other

## 2016-02-01 ENCOUNTER — Emergency Department (HOSPITAL_COMMUNITY): Payer: Medicare Other

## 2016-02-01 ENCOUNTER — Emergency Department (HOSPITAL_COMMUNITY)
Admission: EM | Admit: 2016-02-01 | Discharge: 2016-02-01 | Disposition: A | Discharge status: Home / Self Care | Payer: Medicare Other | Attending: Emergency Medicine | Admitting: Emergency Medicine

## 2016-02-01 ENCOUNTER — Encounter (HOSPITAL_COMMUNITY): Payer: Self-pay | Admitting: Emergency Medicine

## 2016-02-01 DIAGNOSIS — Z87891 Personal history of nicotine dependence: Secondary | ICD-10-CM | POA: Insufficient documentation

## 2016-02-01 DIAGNOSIS — Y9389 Activity, other specified: Secondary | ICD-10-CM | POA: Diagnosis not present

## 2016-02-01 DIAGNOSIS — E119 Type 2 diabetes mellitus without complications: Secondary | ICD-10-CM | POA: Insufficient documentation

## 2016-02-01 DIAGNOSIS — Z923 Personal history of irradiation: Secondary | ICD-10-CM | POA: Insufficient documentation

## 2016-02-01 DIAGNOSIS — Y998 Other external cause status: Secondary | ICD-10-CM | POA: Diagnosis not present

## 2016-02-01 DIAGNOSIS — G629 Polyneuropathy, unspecified: Secondary | ICD-10-CM | POA: Diagnosis not present

## 2016-02-01 DIAGNOSIS — S82432A Displaced oblique fracture of shaft of left fibula, initial encounter for closed fracture: Secondary | ICD-10-CM | POA: Insufficient documentation

## 2016-02-01 DIAGNOSIS — Z8719 Personal history of other diseases of the digestive system: Secondary | ICD-10-CM | POA: Insufficient documentation

## 2016-02-01 DIAGNOSIS — Z8739 Personal history of other diseases of the musculoskeletal system and connective tissue: Secondary | ICD-10-CM | POA: Insufficient documentation

## 2016-02-01 DIAGNOSIS — Z794 Long term (current) use of insulin: Secondary | ICD-10-CM | POA: Insufficient documentation

## 2016-02-01 DIAGNOSIS — Z872 Personal history of diseases of the skin and subcutaneous tissue: Secondary | ICD-10-CM | POA: Insufficient documentation

## 2016-02-01 DIAGNOSIS — W1839XA Other fall on same level, initial encounter: Secondary | ICD-10-CM | POA: Diagnosis not present

## 2016-02-01 DIAGNOSIS — Y92000 Kitchen of unspecified non-institutional (private) residence as  the place of occurrence of the external cause: Secondary | ICD-10-CM | POA: Diagnosis not present

## 2016-02-01 DIAGNOSIS — Z85118 Personal history of other malignant neoplasm of bronchus and lung: Secondary | ICD-10-CM | POA: Insufficient documentation

## 2016-02-01 DIAGNOSIS — Z79899 Other long term (current) drug therapy: Secondary | ICD-10-CM | POA: Diagnosis not present

## 2016-02-01 DIAGNOSIS — I10 Essential (primary) hypertension: Secondary | ICD-10-CM | POA: Diagnosis not present

## 2016-02-01 DIAGNOSIS — S82402A Unspecified fracture of shaft of left fibula, initial encounter for closed fracture: Secondary | ICD-10-CM

## 2016-02-01 DIAGNOSIS — S99912A Unspecified injury of left ankle, initial encounter: Secondary | ICD-10-CM | POA: Diagnosis present

## 2016-02-01 HISTORY — DX: Psoriasis, unspecified: L40.9

## 2016-02-01 LAB — CBC WITH DIFFERENTIAL/PLATELET
BASOS ABS: 0 10*3/uL (ref 0.0–0.1)
BASOS PCT: 0 %
EOS ABS: 0.2 10*3/uL (ref 0.0–0.7)
Eosinophils Relative: 2 %
HCT: 41.5 % (ref 36.0–46.0)
HEMOGLOBIN: 14.2 g/dL (ref 12.0–15.0)
Lymphocytes Relative: 7 %
Lymphs Abs: 0.4 10*3/uL — ABNORMAL LOW (ref 0.7–4.0)
MCH: 29.7 pg (ref 26.0–34.0)
MCHC: 34.2 g/dL (ref 30.0–36.0)
MCV: 86.8 fL (ref 78.0–100.0)
MONO ABS: 0.5 10*3/uL (ref 0.1–1.0)
MONOS PCT: 7 %
NEUTROS ABS: 5.5 10*3/uL (ref 1.7–7.7)
NEUTROS PCT: 84 %
Platelets: 172 10*3/uL (ref 150–400)
RBC: 4.78 MIL/uL (ref 3.87–5.11)
RDW: 14.4 % (ref 11.5–15.5)
WBC: 6.6 10*3/uL (ref 4.0–10.5)

## 2016-02-01 LAB — BASIC METABOLIC PANEL
Anion gap: 11 (ref 5–15)
BUN: 18 mg/dL (ref 6–20)
CHLORIDE: 96 mmol/L — AB (ref 101–111)
CO2: 28 mmol/L (ref 22–32)
Calcium: 9 mg/dL (ref 8.9–10.3)
Creatinine, Ser: 0.87 mg/dL (ref 0.44–1.00)
Glucose, Bld: 347 mg/dL — ABNORMAL HIGH (ref 65–99)
POTASSIUM: 4 mmol/L (ref 3.5–5.1)
SODIUM: 135 mmol/L (ref 135–145)

## 2016-02-01 LAB — I-STAT TROPONIN, ED: TROPONIN I, POC: 0 ng/mL (ref 0.00–0.08)

## 2016-02-01 MED ORDER — HYDROCODONE-ACETAMINOPHEN 5-325 MG PO TABS: 1.0000 | ORAL_TABLET | Freq: Four times a day (QID) | ORAL | Status: DC | PRN

## 2016-02-01 MED ORDER — INSULIN ASPART PROT & ASPART (70-30 MIX) 100 UNIT/ML ~~LOC~~ SUSP
8.0000 [IU] | Freq: Once | SUBCUTANEOUS | Status: AC
  Administered 2016-02-01: 8 [IU] via SUBCUTANEOUS
  Filled 2016-02-01: qty 10

## 2016-02-01 MED ORDER — HYDROCODONE-ACETAMINOPHEN 5-325 MG PO TABS
1.0000 | ORAL_TABLET | Freq: Once | ORAL | Status: AC
  Administered 2016-02-01: 1 via ORAL
  Filled 2016-02-01: qty 1

## 2016-02-01 NOTE — ED Notes (Signed)
MD at bedside. 

## 2016-02-01 NOTE — Discharge Instructions (Signed)
Cast or Splint Care °Casts and splints support injured limbs and keep bones from moving while they heal. It is important to care for your cast or splint at home.   °HOME CARE INSTRUCTIONS °· Keep the cast or splint uncovered during the drying period. It can take 24 to 48 hours to dry if it is made of plaster. A fiberglass cast will dry in less than 1 hour. °· Do not rest the cast on anything harder than a pillow for the first 24 hours. °· Do not put weight on your injured limb or apply pressure to the cast until your health care provider gives you permission. °· Keep the cast or splint dry. Wet casts or splints can lose their shape and may not support the limb as well. A wet cast that has lost its shape can also create harmful pressure on your skin when it dries. Also, wet skin can become infected. °· Cover the cast or splint with a plastic bag when bathing or when out in the rain or snow. If the cast is on the trunk of the body, take sponge baths until the cast is removed. °· If your cast does become wet, dry it with a towel or a blow dryer on the cool setting only. °· Keep your cast or splint clean. Soiled casts may be wiped with a moistened cloth. °· Do not place any hard or soft foreign objects under your cast or splint, such as cotton, toilet paper, lotion, or powder. °· Do not try to scratch the skin under the cast with any object. The object could get stuck inside the cast. Also, scratching could lead to an infection. If itching is a problem, use a blow dryer on a cool setting to relieve discomfort. °· Do not trim or cut your cast or remove padding from inside of it. °· Exercise all joints next to the injury that are not immobilized by the cast or splint. For example, if you have a long leg cast, exercise the hip joint and toes. If you have an arm cast or splint, exercise the shoulder, elbow, thumb, and fingers. °· Elevate your injured arm or leg on 1 or 2 pillows for the first 1 to 3 days to decrease  swelling and pain. It is best if you can comfortably elevate your cast so it is higher than your heart. °SEEK MEDICAL CARE IF:  °· Your cast or splint cracks. °· Your cast or splint is too tight or too loose. °· You have unbearable itching inside the cast. °· Your cast becomes wet or develops a soft spot or area. °· You have a bad smell coming from inside your cast. °· You get an object stuck under your cast. °· Your skin around the cast becomes red or raw. °· You have new pain or worsening pain after the cast has been applied. °SEEK IMMEDIATE MEDICAL CARE IF:  °· You have fluid leaking through the cast. °· You are unable to move your fingers or toes. °· You have discolored (blue or white), cool, painful, or very swollen fingers or toes beyond the cast. °· You have tingling or numbness around the injured area. °· You have severe pain or pressure under the cast. °· You have any difficulty with your breathing or have shortness of breath. °· You have chest pain. °  °This information is not intended to replace advice given to you by your health care provider. Make sure you discuss any questions you have with your health care   provider.   Document Released: 10/14/2000 Document Revised: 08/07/2013 Document Reviewed: 04/25/2013 Elsevier Interactive Patient Education 2016 Elsevier [employer]  Fibular Ankle Fracture Treated With or Without Immobilization, Adult A fibular fracture at your ankle is a break (fracture) bone in the smallest of the two bones in your lower leg, located on the outside of your leg (fibula) close to the area at your ankle joint. CAUSES  Rolling your ankle.  Twisting your ankle.  Extreme flexing or extending of your foot.  Severe force on your ankle as when falling from a distance. RISK FACTORS  Jumping activities.  Participation in sports.  Osteoporosis.  Advanced age.  Previous ankle injuries. SIGNS AND SYMPTOMS  Pain.  Swelling.  Inability to put weight on injured  ankle.  Bruising.  Bone deformities at site of injury. DIAGNOSIS  This fracture is diagnosed with the help of an X-ray exam. TREATMENT  If the fractured bone did not move out of place it usually will heal without problems and does casting or splinting. If immobilization is needed for comfort or the fractured bone moved out of place and will not heal properly with immobilization, a cast or splint will be used. HOME CARE INSTRUCTIONS   Apply ice to the area of injury:  Put ice in a plastic bag.  Place a towel between your skin and the bag.  Leave the ice on for 20 minutes, 2-3 times a day.  Use crutches as directed. Resume walking without crutches as directed by your health care provider.  Only take over-the-counter or prescription medicines for pain, discomfort, or fever as directed by your health care provider.  If you have a removable splint or boot, do not remove the boot unless directed by your health care provider. SEEK MEDICAL CARE IF:   You have continued pain or more swelling  The medications do not control the pain. SEEK IMMEDIATE MEDICAL CARE IF:  You develop severe pain in the leg or foot.  Your skin or nails below the injury turn blue or grey or feel cold or numb. MAKE SURE YOU:   Understand these instructions.  Will watch your condition.  Will get help right away if you are not doing well or get worse.   This information is not intended to replace advice given to you by your health care provider. Make sure you discuss any questions you have with your health care provider.   Document Released: 10/17/2005 Document Revised: 11/07/2014 Document Reviewed: 05/29/2013 Elsevier Interactive Patient Education [employer].

## 2016-02-01 NOTE — ED Provider Notes (Signed)
CSN: [id number]     Arrival date & time 02/01/16  1033 History   First MD Initiated Contact with Patient 02/01/16 1041     Chief Complaint  Patient presents with  . Fall  . Near Syncope     (Consider location/radiation/quality/duration/timing/severity/associated sxs/prior Treatment) Patient is a 75 y.o. female presenting with fall and ankle pain. The history is provided by the patient.  Fall This is a new problem. The current episode started 1 to 2 hours ago. The problem occurs constantly. The problem has not changed since onset.Pertinent negatives include no chest pain, no abdominal pain, no headaches and no shortness of breath. Nothing aggravates the symptoms. Nothing relieves the symptoms. She has tried nothing for the symptoms.  Ankle Pain Location:  Ankle Injury: yes   Mechanism of injury: fall   Ankle location:  L ankle Pain details:    Quality:  Aching   Radiates to:  Does not radiate   Severity:  Moderate   Onset quality:  Sudden   Timing:  Constant   Progression:  Unchanged Chronicity:  New Dislocation: no   Foreign body present:  No foreign bodies Prior injury to area:  No Relieved by:  Nothing Worsened by:  Nothing tried Ineffective treatments:  None tried Associated symptoms: swelling     Past Medical History  Diagnosis Date  . Hypertension   . Rheumatoid arthritis ([place])   . GERD (gastroesophageal reflux disease)     no meds for  . Hx of radiation therapy 12/16/13-01/30/14    lung 66Gy  . Neuropathy ([place])   . Shortness of breath dyspnea     with exertion  . History of hiatal hernia   . Diabetes mellitus     insulin  . Cancer ([place]) dx'd 09/2013    Lung ca  . Malignant neoplasm of right upper lobe of lung ([place]) 11/28/2013  . Psoriasis    Past Surgical History  Procedure Laterality Date  . Cholecystectomy    . Abdominal hysterectomy    . Dilation and curettage of uterus    . Abdominal surgery    . Tonsillectomy    . Esophagogastroduodenoscopy (egd)  with propofol N/A 10/09/2014    Procedure: ESOPHAGOGASTRODUODENOSCOPY (EGD) WITH PROPOFOL;  Surgeon: Inda Castle, MD;  Location: [facility];  Service: Endoscopy;  Laterality: N/A;  . Savory dilation N/A 10/09/2014    Procedure: SAVORY DILATION;  Surgeon: Inda Castle, MD;  Location: [facility];  Service: Endoscopy;  Laterality: N/A;  . Esophagogastroduodenoscopy N/A 12/12/2014    Procedure: ESOPHAGOGASTRODUODENOSCOPY (EGD);  Surgeon: Inda Castle, MD;  Location: [facility];  Service: Endoscopy;  Laterality: N/A;  with dilation  . Balloon dilation N/A 12/12/2014    Procedure: BALLOON DILATION;  Surgeon: Inda Castle, MD;  Location: [facility];  Service: Endoscopy;  Laterality: N/A;  . Orif ankle fracture Right 07/04/2015    Procedure: OPEN REDUCTION INTERNAL FIXATION (ORIF) ANKLE FRACTURE;  Surgeon: Newt Minion, MD;  Location: [facility];  Service: Orthopedics;  Laterality: Right;  OPEN REDUCTION, INTERNAL FIXATION OF RIGHT ANKLE FRACTURE.    Family History  Problem Relation Age of Onset  . Hypertension Other   . Stroke Other   . Heart attack Other   . Hemachromatosis Other   . Rheum arthritis      both sets of grandparents and father  . Other Mother   . Other Father     failure to thrive   Social History  Substance Use Topics  .  Smoking status: Former Smoker -- 3.00 packs/day for 45 years    Types: Cigarettes    Quit date: 12/05/1997  . Smokeless tobacco: Never Used  . Alcohol Use: No   OB History    No data available     Review of Systems  Respiratory: Negative for shortness of breath.   Cardiovascular: Negative for chest pain.  Gastrointestinal: Negative for abdominal pain.  Neurological: Negative for headaches.  All other systems reviewed and are negative.     Allergies  Remicade; Carboplatin; and Hydromorphone  Home Medications   Prior to Admission medications   Medication Sig Start Date End Date Taking? Authorizing Provider  B-D ULTRAFINE III  SHORT PEN 31G X 8 MM MISC  10/28/14  Yes Historical Provider, MD  DULoxetine (CYMBALTA) 60 MG capsule Take 1 capsule (60 mg total) by mouth 2 (two) times daily. 08/12/15  Yes Melvenia Beam, MD  fluticasone (CUTIVATE) 0.05 % cream Apply 1 application topically 2 (two) times daily as needed (rash).    Yes Historical Provider, MD  Fluticasone-Salmeterol (ADVAIR DISKUS) 100-50 MCG/DOSE AEPB Inhale 1 puff into the lungs 2 (two) times daily. Patient taking differently: Inhale 1 puff into the lungs 2 (two) times daily as needed (sob and wheezing).  08/27/14  Yes Brand Males, MD  HUMALOG KWIKPEN 100 UNIT/ML KiwkPen Inject 6 Units into the skin 3 (three) times daily. Takes 6 units in the morning and at lunchtime, then takes 10 units in the evening 10/07/15  Yes Historical Provider, MD  insulin detemir (LEVEMIR) 100 UNIT/ML injection Inject 34 Units into the skin at bedtime.    Yes Historical Provider, MD  ketoconazole (NIZORAL) 2 % cream Apply 1 application topically 2 (two) times daily as needed for irritation.  01/04/16  Yes Historical Provider, MD  losartan-hydrochlorothiazide (HYZAAR) 100-25 MG tablet Take 1 tablet by mouth daily. 10/13/15  Yes Historical Provider, MD  [place]  02/04/15  Yes Historical Provider, MD  potassium chloride SA (K-DUR,KLOR-CON) 20 MEQ tablet Take 40 mEq by mouth daily.   Yes Historical Provider, MD  Gabapentin Enacarbil (HORIZANT) 600 MG TBCR Take 1,200 mg by mouth daily. As directed Patient not taking: Reported on 02/01/2016 12/14/15   Melvenia Beam, MD   BP 111/64 mmHg  Pulse 108  Temp(Src) 98.6 F (37 C) (Oral)  Resp 16  SpO2 97% Physical Exam  Constitutional: She is oriented to person, place, and time. She appears well-developed and well-nourished. No distress.  HENT:  Head: Normocephalic.  Eyes: Conjunctivae are normal.  Neck: Neck supple. No tracheal deviation present.  Cardiovascular: Normal rate, regular rhythm and normal heart  sounds.   Pulmonary/Chest: Effort normal and breath sounds normal. No respiratory distress. She has no wheezes. She has no rales. She exhibits no tenderness.  Abdominal: Soft. She exhibits no distension. There is no tenderness.  Musculoskeletal:       Left ankle: She exhibits swelling and ecchymosis. She exhibits no deformity and normal pulse. Tenderness. Lateral malleolus tenderness found. Achilles tendon normal.  Neurological: She is alert and oriented to person, place, and time. She has normal strength. No cranial nerve deficit or sensory deficit. Coordination normal. GCS eye subscore is 4. GCS verbal subscore is 5. GCS motor subscore is 6.  Skin: Skin is warm and dry.  Psychiatric: She has a normal mood and affect.    ED Course  Procedures (including critical care time)  SPLINT APPLICATION Date/Time: 6:57 AM Authorized by: Leo Grosser Consent: Verbal consent  obtained. Risks and benefits: risks, benefits and alternatives were discussed Consent given by: patient Splint applied by: orthopedic technician Location details: left ankle Splint type: short leg posterior Supplies used: plaster Post-procedure: The splinted body part was neurovascularly unchanged following the procedure. Patient tolerance: Patient tolerated the procedure well with no immediate complications.   Labs Review Labs Reviewed  BASIC METABOLIC PANEL - Abnormal; Notable for the following:    Chloride 96 (*)    Glucose, Bld 347 (*)    All other components within normal limits  CBC WITH DIFFERENTIAL/PLATELET - Abnormal; Notable for the following:    Lymphs Abs 0.4 (*)    All other components within normal limits  I-STAT TROPOININ, ED    Imaging Review Dg Ankle Complete Left  02/01/2016  CLINICAL DATA:  75 year old female fell in kitchen. Ankle pain. Initial encounter. EXAM: LEFT ANKLE COMPLETE - 3+ VIEW COMPARISON:  07/22/2014. FINDINGS: Oblique fracture of distal left fibula with slight separation of fracture  fragments. Slight irregularity of the cuboid. If there were tenderness in this region, left foot films recommended for further delineation. Plantar spur. IMPRESSION: Oblique fracture of distal left fibula with slight separation of fracture fragments. Slight irregularity of the cuboid. If there were tenderness in this region, left foot films recommended for further delineation. Electronically Signed   By: Genia Del M.D.   On: 02/01/2016 11:39   Dg Ankle Complete Right  02/01/2016  CLINICAL DATA:  [place] in kitchen this morning, patient states she was light headed when she fell, left ankle pain worse than right. Hx right ankle surgery x 2 by dr. Sullivan Lone: RIGHT ANKLE - COMPLETE 3+ VIEW COMPARISON:  08/01/2015 FINDINGS: Plate and screw fixation device within the distal right fibula. No acute fracture, subluxation or dislocation. Soft tissues are intact. IMPRESSION: No acute bony abnormality. Electronically Signed   By: Rolm Baptise M.D.   On: 02/01/2016 11:36   Dg Foot Complete Left  02/01/2016  CLINICAL DATA:  75 year old female with ankle fracture. Foot pain. Post fall. Subsequent encounter. EXAM: LEFT FOOT - COMPLETE 3+ VIEW COMPARISON:  02/01/2016 ankle films. FINDINGS: Fracture of the distal left fibula as noted on ankle films. No other fracture or dislocation noted. Plantar spur. IMPRESSION: Fracture of the distal left fibula as noted on ankle films. No other fracture or dislocation noted. Electronically Signed   By: Genia Del M.D.   On: 02/01/2016 13:12   I have personally reviewed and evaluated these images and lab results as part of my medical decision-making.   EKG Interpretation   Date/Time:  Monday February 01 2016 10:47:35 EDT Ventricular Rate:  109 PR Interval:  158 QRS Duration: 93 QT Interval:  336 QTC Calculation: 452 R Axis:   26 Text Interpretation:  Sinus tachycardia Probable left atrial enlargement  Baseline wander in lead(s) V2 No significant change since last tracing   Confirmed by Mylynn Dinh MD, Quillian Quince ([id number]) on 02/01/2016 12:13:43 PM      MDM   Final diagnoses:  Left fibular fracture, closed, initial encounter    75 y.o. female presents with fall from standing where she felt like her knees gave out, no LOC. Able to ambulate to EMS after fall. Has large swelling to left ankle and associated fibular fracture. No evidence of hardware complication on the right. Has seen Dr Sharol Given in the past for surgical procedure and requested to follow up with him. I scheduled an appointment for tomorrow to f/u in clinic as Pt is a fall risk and  would likely benefit from early establishment of care and referral for PT/OT at home or consideration of surgical repair given her difficulties with mobility. No high risk features or syncope. No significant hematologic or metabolic abnormalities to explain symptoms. Glucose is fairly high which Pt states is not uncommon, given home dose of insulin for correction. Plan to follow up with orthopedics tomorrow and PCP as needed. Return precautions discussed for worsening or new concerning symptoms.     Leo Grosser, MD 02/02/16 [phone]

## 2016-02-01 NOTE — ED Notes (Signed)
Pt from home. Hx of near-syncope. Today she woke up dizzy. Went to answer the phone and her knees gave out causing her to fall backwards onto her bottom. Pt complains of bilateral ankle pain. Hx of R ankle fx 6 months ago. Both ankles are swollen. Was able to walk with her walker with EMS.

## 2016-02-01 NOTE — ED Notes (Signed)
Bed: NI77 Expected date:  Expected time:  Means of arrival:  Comments: EMS- 75yo F, syncope/weakness/dizziness

## 2016-02-03 NOTE — Pre-Procedure Instructions (Signed)
Megan [name]  02/03/2016      [facility] - [place], [address] [place] [address] [address] Phone: [phone] Fax: [phone]    Your procedure is scheduled on 02/05/16.  Report to [facility] Admitting at 845 A.M.  Call this number if you have problems the morning of surgery:  [phone]   Remember:  Do not eat food or drink liquids after midnight.  Take these medicines the morning of surgery with A SIP OF WATER advair if needed, gabapentin, pain med if needed  STOP all herbel meds, nsaids (aleve,naproxen,advil,ibuprofen) starting today including vitamins, aspirin    How to Manage Your Diabetes Before and After Surgery  Why is it important to control my blood sugar before and after surgery? . Improving blood sugar levels before and after surgery helps healing and can limit problems. . A way of improving blood sugar control is eating a healthy diet by: o  Eating less sugar and carbohydrates o  Increasing activity/exercise o  Talking with your doctor about reaching your blood sugar goals . High blood sugars (greater than 180 mg/dL) can raise your risk of infections and slow your recovery, so you will need to focus on controlling your diabetes during the weeks before surgery. . Make sure that the doctor who takes care of your diabetes knows about your planned surgery including the date and location.  How do I manage my blood sugar before surgery? . Check your blood sugar at least 4 times a day, starting 2 days before surgery, to make sure that the level is not too high or low. o Check your blood sugar the morning of your surgery when you wake up and every 2 hours until you get to the Short Stay unit. . If your blood sugar is less than 70 mg/dL, you will need to treat for low blood sugar: o Do not take insulin. o Treat a low blood sugar (less than 70 mg/dL) with  cup  of clear juice (cranberry or apple), 4 glucose tablets, OR glucose gel. o Recheck blood sugar in 15 minutes after treatment (to make sure it is greater than 70 mg/dL). If your blood sugar is not greater than 70 mg/dL on recheck, call [phone] for further instructions. . Report your blood sugar to the short stay nurse when you get to Short Stay.  . If you are admitted to the hospital after surgery: o Your blood sugar will be checked by the staff and you will probably be given insulin after surgery (instead of oral diabetes medicines) to make sure you have good blood sugar levels. o The goal for blood sugar control after surgery is 80-180 mg/dL.  WHAT DO I DO ABOUT MY DIABETES MEDICATION?   [name] Do not take oral diabetes medicines (pills) the morning of surgery.  . THE NIGHT BEFORE SURGERY, take 28  units of levemir insulin. Do not take any dinner /bedtime dose of humalog insulin      . THE MORNING OF SURGERY, take No humalog insulin. except as instructed below  If your CBG is greater than 220 mg/dL, you may take  of your sliding scale (correction) dose of insulin.      Patient Signature:  Date:   Nurse Signature:  Date:   Reviewed and Endorsed by [facility] Patient Education Committee, August 2015   Do not wear jewelry, make-up or nail polish.  Do not wear lotions, powders, or perfumes.  You may wear deodorant.  Do not shave 48 hours prior to surgery.  Men may shave face and neck.  Do not bring valuables to the hospital.  [facility] is not responsible for any belongings or valuables.  Contacts, dentures or bridgework may not be worn into surgery.  Leave your suitcase in the car.  After surgery it may be brought to your room.  For patients admitted to the hospital, discharge time will be determined by your treatment team.  Patients discharged the day of surgery will not be allowed to drive home.   Name and phone number of your driver:   Special instructions:   Special  Instructions: [facility] - Preparing for Surgery  Before surgery, you can play an important role.  Because skin is not sterile, your skin needs to be as free of germs as possible.  You can reduce the number of germs on you skin by washing with CHG (chlorahexidine gluconate) soap before surgery.  CHG is an antiseptic cleaner which kills germs and bonds with the skin to continue killing germs even after washing.  Please DO NOT use if you have an allergy to CHG or antibacterial soaps.  If your skin becomes reddened/irritated stop using the CHG and inform your nurse when you arrive at Short Stay.  Do not shave (including legs and underarms) for at least 48 hours prior to the first CHG shower.  You may shave your face.  Please follow these instructions carefully:   1.  Shower with CHG Soap the night before surgery and the morning of Surgery.  2.  If you choose to wash your hair, wash your hair first as usual with your normal shampoo.  3.  After you shampoo, rinse your hair and body thoroughly to remove the Shampoo.  4.  Use CHG as you would any other liquid soap.  You can apply chg directly  to the skin and wash gently with scrungie or a clean washcloth.  5.  Apply the CHG Soap to your body ONLY FROM THE NECK DOWN.  Do not use on open wounds or open sores.  Avoid contact with your eyes ears, mouth and genitals (private parts).  Wash genitals (private parts)       with your normal soap.  6.  Wash thoroughly, paying special attention to the area where your surgery will be performed.  7.  Thoroughly rinse your body with warm water from the neck down.  8.  DO NOT shower/wash with your normal soap after using and rinsing off the CHG Soap.  9.  Pat yourself dry with a clean towel.            10.  Wear clean pajamas.            11.  Place clean sheets on your bed the night of your first shower and do not sleep with pets.  Day of Surgery  Do not apply any lotions/deodorants the morning of surgery.   Please wear clean clothes to the hospital/surgery center.  Please read over the following fact sheets that you were given. Pain Booklet, Coughing and Deep Breathing and Surgical Site Infection Prevention

## 2016-02-04 ENCOUNTER — Encounter (HOSPITAL_COMMUNITY): Payer: Self-pay | Admitting: Emergency Medicine

## 2016-02-04 ENCOUNTER — Encounter (HOSPITAL_COMMUNITY)
Admission: RE | Admit: 2016-02-04 | Discharge: 2016-02-04 | Disposition: A | Discharge status: Home / Self Care | Payer: Medicare Other | Source: Ambulatory Visit | Attending: Orthopedic Surgery | Admitting: Orthopedic Surgery

## 2016-02-04 ENCOUNTER — Emergency Department (HOSPITAL_COMMUNITY): Payer: Medicare Other

## 2016-02-04 ENCOUNTER — Other Ambulatory Visit (HOSPITAL_COMMUNITY): Payer: Self-pay | Admitting: Family

## 2016-02-04 ENCOUNTER — Observation Stay (HOSPITAL_COMMUNITY)
Admission: EM | Admit: 2016-02-04 | Discharge: 2016-02-12 | Disposition: A | Discharge status: Skilled Nursing Facility | Payer: Medicare Other | Attending: Internal Medicine | Admitting: Internal Medicine

## 2016-02-04 ENCOUNTER — Inpatient Hospital Stay (HOSPITAL_COMMUNITY): Payer: Medicare Other

## 2016-02-04 ENCOUNTER — Encounter (HOSPITAL_COMMUNITY): Payer: Self-pay | Admitting: Vascular Surgery

## 2016-02-04 DIAGNOSIS — R918 Other nonspecific abnormal finding of lung field: Secondary | ICD-10-CM

## 2016-02-04 DIAGNOSIS — Z87891 Personal history of nicotine dependence: Secondary | ICD-10-CM | POA: Insufficient documentation

## 2016-02-04 DIAGNOSIS — I959 Hypotension, unspecified: Secondary | ICD-10-CM | POA: Insufficient documentation

## 2016-02-04 DIAGNOSIS — E119 Type 2 diabetes mellitus without complications: Secondary | ICD-10-CM | POA: Insufficient documentation

## 2016-02-04 DIAGNOSIS — R000 Tachycardia, unspecified: Secondary | ICD-10-CM

## 2016-02-04 DIAGNOSIS — R0600 Dyspnea, unspecified: Secondary | ICD-10-CM

## 2016-02-04 DIAGNOSIS — K222 Esophageal obstruction: Secondary | ICD-10-CM

## 2016-02-04 DIAGNOSIS — S82402A Unspecified fracture of shaft of left fibula, initial encounter for closed fracture: Secondary | ICD-10-CM | POA: Insufficient documentation

## 2016-02-04 DIAGNOSIS — Z7189 Other specified counseling: Secondary | ICD-10-CM

## 2016-02-04 DIAGNOSIS — T66XXXA Radiation sickness, unspecified, initial encounter: Secondary | ICD-10-CM

## 2016-02-04 DIAGNOSIS — Z515 Encounter for palliative care: Secondary | ICD-10-CM

## 2016-02-04 DIAGNOSIS — M069 Rheumatoid arthritis, unspecified: Secondary | ICD-10-CM | POA: Insufficient documentation

## 2016-02-04 DIAGNOSIS — K625 Hemorrhage of anus and rectum: Secondary | ICD-10-CM | POA: Diagnosis present

## 2016-02-04 DIAGNOSIS — R55 Syncope and collapse: Secondary | ICD-10-CM | POA: Diagnosis not present

## 2016-02-04 DIAGNOSIS — R197 Diarrhea, unspecified: Secondary | ICD-10-CM

## 2016-02-04 DIAGNOSIS — C3490 Malignant neoplasm of unspecified part of unspecified bronchus or lung: Secondary | ICD-10-CM | POA: Diagnosis not present

## 2016-02-04 DIAGNOSIS — S82892A Other fracture of left lower leg, initial encounter for closed fracture: Secondary | ICD-10-CM | POA: Diagnosis not present

## 2016-02-04 DIAGNOSIS — E860 Dehydration: Secondary | ICD-10-CM | POA: Diagnosis not present

## 2016-02-04 DIAGNOSIS — K219 Gastro-esophageal reflux disease without esophagitis: Secondary | ICD-10-CM | POA: Insufficient documentation

## 2016-02-04 DIAGNOSIS — K208 Other esophagitis without bleeding: Secondary | ICD-10-CM

## 2016-02-04 DIAGNOSIS — R05 Cough: Secondary | ICD-10-CM

## 2016-02-04 DIAGNOSIS — W19XXXA Unspecified fall, initial encounter: Secondary | ICD-10-CM | POA: Insufficient documentation

## 2016-02-04 DIAGNOSIS — Z01818 Encounter for other preprocedural examination: Secondary | ICD-10-CM | POA: Diagnosis not present

## 2016-02-04 DIAGNOSIS — K648 Other hemorrhoids: Secondary | ICD-10-CM | POA: Insufficient documentation

## 2016-02-04 DIAGNOSIS — Z85118 Personal history of other malignant neoplasm of bronchus and lung: Secondary | ICD-10-CM | POA: Diagnosis not present

## 2016-02-04 DIAGNOSIS — R530 Neoplastic (malignant) related fatigue: Secondary | ICD-10-CM

## 2016-02-04 DIAGNOSIS — Z01812 Encounter for preprocedural laboratory examination: Secondary | ICD-10-CM | POA: Insufficient documentation

## 2016-02-04 DIAGNOSIS — C3411 Malignant neoplasm of upper lobe, right bronchus or lung: Secondary | ICD-10-CM

## 2016-02-04 DIAGNOSIS — Z794 Long term (current) use of insulin: Secondary | ICD-10-CM | POA: Diagnosis not present

## 2016-02-04 DIAGNOSIS — R600 Localized edema: Secondary | ICD-10-CM

## 2016-02-04 DIAGNOSIS — I10 Essential (primary) hypertension: Secondary | ICD-10-CM | POA: Diagnosis not present

## 2016-02-04 DIAGNOSIS — N179 Acute kidney failure, unspecified: Secondary | ICD-10-CM

## 2016-02-04 DIAGNOSIS — N289 Disorder of kidney and ureter, unspecified: Secondary | ICD-10-CM

## 2016-02-04 DIAGNOSIS — E1165 Type 2 diabetes mellitus with hyperglycemia: Secondary | ICD-10-CM

## 2016-02-04 DIAGNOSIS — T451X5A Adverse effect of antineoplastic and immunosuppressive drugs, initial encounter: Secondary | ICD-10-CM

## 2016-02-04 DIAGNOSIS — R112 Nausea with vomiting, unspecified: Secondary | ICD-10-CM | POA: Insufficient documentation

## 2016-02-04 DIAGNOSIS — R232 Flushing: Secondary | ICD-10-CM

## 2016-02-04 DIAGNOSIS — K633 Ulcer of intestine: Secondary | ICD-10-CM | POA: Diagnosis not present

## 2016-02-04 DIAGNOSIS — K7689 Other specified diseases of liver: Secondary | ICD-10-CM

## 2016-02-04 DIAGNOSIS — G629 Polyneuropathy, unspecified: Secondary | ICD-10-CM | POA: Diagnosis not present

## 2016-02-04 DIAGNOSIS — X58XXXA Exposure to other specified factors, initial encounter: Secondary | ICD-10-CM | POA: Insufficient documentation

## 2016-02-04 DIAGNOSIS — K921 Melena: Secondary | ICD-10-CM | POA: Insufficient documentation

## 2016-02-04 DIAGNOSIS — R252 Cramp and spasm: Secondary | ICD-10-CM

## 2016-02-04 DIAGNOSIS — D696 Thrombocytopenia, unspecified: Secondary | ICD-10-CM

## 2016-02-04 DIAGNOSIS — Z23 Encounter for immunization: Secondary | ICD-10-CM

## 2016-02-04 DIAGNOSIS — R609 Edema, unspecified: Secondary | ICD-10-CM

## 2016-02-04 DIAGNOSIS — E8809 Other disorders of plasma-protein metabolism, not elsewhere classified: Secondary | ICD-10-CM

## 2016-02-04 DIAGNOSIS — G620 Drug-induced polyneuropathy: Secondary | ICD-10-CM

## 2016-02-04 DIAGNOSIS — Z5112 Encounter for antineoplastic immunotherapy: Secondary | ICD-10-CM

## 2016-02-04 DIAGNOSIS — K529 Noninfective gastroenteritis and colitis, unspecified: Secondary | ICD-10-CM | POA: Diagnosis not present

## 2016-02-04 DIAGNOSIS — R300 Dysuria: Secondary | ICD-10-CM

## 2016-02-04 DIAGNOSIS — G2581 Restless legs syndrome: Secondary | ICD-10-CM

## 2016-02-04 DIAGNOSIS — J439 Emphysema, unspecified: Secondary | ICD-10-CM

## 2016-02-04 DIAGNOSIS — J9819 Other pulmonary collapse: Secondary | ICD-10-CM

## 2016-02-04 DIAGNOSIS — K521 Toxic gastroenteritis and colitis: Secondary | ICD-10-CM

## 2016-02-04 DIAGNOSIS — M792 Neuralgia and neuritis, unspecified: Secondary | ICD-10-CM

## 2016-02-04 DIAGNOSIS — R0789 Other chest pain: Secondary | ICD-10-CM

## 2016-02-04 DIAGNOSIS — L409 Psoriasis, unspecified: Secondary | ICD-10-CM | POA: Diagnosis present

## 2016-02-04 DIAGNOSIS — J209 Acute bronchitis, unspecified: Secondary | ICD-10-CM

## 2016-02-04 DIAGNOSIS — C342 Malignant neoplasm of middle lobe, bronchus or lung: Secondary | ICD-10-CM

## 2016-02-04 DIAGNOSIS — E876 Hypokalemia: Secondary | ICD-10-CM | POA: Diagnosis not present

## 2016-02-04 DIAGNOSIS — R109 Unspecified abdominal pain: Secondary | ICD-10-CM | POA: Insufficient documentation

## 2016-02-04 DIAGNOSIS — R933 Abnormal findings on diagnostic imaging of other parts of digestive tract: Secondary | ICD-10-CM

## 2016-02-04 DIAGNOSIS — R059 Cough, unspecified: Secondary | ICD-10-CM

## 2016-02-04 DIAGNOSIS — G893 Neoplasm related pain (acute) (chronic): Secondary | ICD-10-CM

## 2016-02-04 DIAGNOSIS — F329 Major depressive disorder, single episode, unspecified: Secondary | ICD-10-CM

## 2016-02-04 DIAGNOSIS — R64 Cachexia: Secondary | ICD-10-CM

## 2016-02-04 DIAGNOSIS — R509 Fever, unspecified: Secondary | ICD-10-CM

## 2016-02-04 DIAGNOSIS — F32A Depression, unspecified: Secondary | ICD-10-CM

## 2016-02-04 LAB — PROTIME-INR
INR: 0.91 (ref 0.00–1.49)
Prothrombin Time: 12.5 seconds (ref 11.6–15.2)

## 2016-02-04 LAB — COMPREHENSIVE METABOLIC PANEL
ALBUMIN: 3 g/dL — AB (ref 3.5–5.0)
ALT: 13 U/L — AB (ref 14–54)
AST: 17 U/L (ref 15–41)
Alkaline Phosphatase: 119 U/L (ref 38–126)
Anion gap: 15 (ref 5–15)
BUN: 18 mg/dL (ref 6–20)
CHLORIDE: 95 mmol/L — AB (ref 101–111)
CO2: 24 mmol/L (ref 22–32)
CREATININE: 1.69 mg/dL — AB (ref 0.44–1.00)
Calcium: 9.1 mg/dL (ref 8.9–10.3)
GFR calc Af Amer: 33 mL/min — ABNORMAL LOW (ref >60)
GFR, EST NON AFRICAN AMERICAN: 29 mL/min — AB (ref >60)
GLUCOSE: 312 mg/dL — AB (ref 65–99)
Potassium: 3 mmol/L — ABNORMAL LOW (ref 3.5–5.1)
SODIUM: 134 mmol/L — AB (ref 135–145)
Total Bilirubin: 1.1 mg/dL (ref 0.3–1.2)
Total Protein: 6.3 g/dL — ABNORMAL LOW (ref 6.5–8.1)

## 2016-02-04 LAB — C DIFFICILE QUICK SCREEN W PCR REFLEX
C Diff antigen: NEGATIVE
C Diff interpretation: NEGATIVE
C Diff toxin: NEGATIVE

## 2016-02-04 LAB — CBC
HCT: 46 % (ref 36.0–46.0)
HEMATOCRIT: 39 % (ref 36.0–46.0)
Hemoglobin: 15.1 g/dL — ABNORMAL HIGH (ref 12.0–15.0)
Hemoglobin: 12.8 g/dL (ref 12.0–15.0)
MCH: 29.3 pg (ref 26.0–34.0)
MCH: 29.4 pg (ref 26.0–34.0)
MCHC: 32.8 g/dL (ref 30.0–36.0)
MCHC: 32.8 g/dL (ref 30.0–36.0)
MCV: 89.3 fL (ref 78.0–100.0)
MCV: 89.4 fL (ref 78.0–100.0)
PLATELETS: 252 10*3/uL (ref 150–400)
Platelets: 216 10*3/uL (ref 150–400)
RBC: 5.15 MIL/uL — ABNORMAL HIGH (ref 3.87–5.11)
RBC: 4.36 MIL/uL (ref 3.87–5.11)
RDW: 14.8 % (ref 11.5–15.5)
RDW: 14.6 % (ref 11.5–15.5)
WBC: 10.9 10*3/uL — ABNORMAL HIGH (ref 4.0–10.5)
WBC: 8.5 10*3/uL (ref 4.0–10.5)

## 2016-02-04 LAB — TYPE AND SCREEN
ABO/RH(D): O NEG
ANTIBODY SCREEN: NEGATIVE

## 2016-02-04 LAB — I-STAT CHEM 8, ED
BUN: 21 mg/dL — AB (ref 6–20)
CHLORIDE: 93 mmol/L — AB (ref 101–111)
Calcium, Ion: 1.06 mmol/L — ABNORMAL LOW (ref 1.13–1.30)
Creatinine, Ser: 1.4 mg/dL — ABNORMAL HIGH (ref 0.44–1.00)
Glucose, Bld: 302 mg/dL — ABNORMAL HIGH (ref 65–99)
HEMATOCRIT: 51 % — AB (ref 36.0–46.0)
Hemoglobin: 17.3 g/dL — ABNORMAL HIGH (ref 12.0–15.0)
Potassium: 2.9 mmol/L — ABNORMAL LOW (ref 3.5–5.1)
SODIUM: 133 mmol/L — AB (ref 135–145)
TCO2: 24 mmol/L (ref 0–100)

## 2016-02-04 LAB — GLUCOSE, CAPILLARY
GLUCOSE-CAPILLARY: 264 mg/dL — AB (ref 65–99)
GLUCOSE-CAPILLARY: 422 mg/dL — AB (ref 65–99)

## 2016-02-04 LAB — I-STAT CG4 LACTIC ACID, ED: Lactic Acid, Venous: 2.13 mmol/L (ref 0.5–2.0)

## 2016-02-04 LAB — POC OCCULT BLOOD, ED: Fecal Occult Bld: POSITIVE — AB

## 2016-02-04 LAB — LACTIC ACID, PLASMA: Lactic Acid, Venous: 1.6 mmol/L (ref 0.5–2.0)

## 2016-02-04 LAB — ABO/RH: ABO/RH(D): O NEG

## 2016-02-04 MED ORDER — INSULIN ASPART 100 UNIT/ML ~~LOC~~ SOLN
8.0000 [IU] | Freq: Once | SUBCUTANEOUS | Status: AC
  Administered 2016-02-05: 3 [IU] via SUBCUTANEOUS

## 2016-02-04 MED ORDER — HYDROCODONE-ACETAMINOPHEN 5-325 MG PO TABS
1.0000 | ORAL_TABLET | Freq: Four times a day (QID) | ORAL | Status: DC | PRN
  Administered 2016-02-10 – 2016-02-11 (×2): 1 via ORAL
  Filled 2016-02-04 (×4): qty 1

## 2016-02-04 MED ORDER — INSULIN ASPART 100 UNIT/ML ~~LOC~~ SOLN
0.0000 [IU] | Freq: Three times a day (TID) | SUBCUTANEOUS | Status: DC
  Administered 2016-02-05: 7 [IU] via SUBCUTANEOUS
  Administered 2016-02-05: 3 [IU] via SUBCUTANEOUS
  Administered 2016-02-05: 2 [IU] via SUBCUTANEOUS
  Administered 2016-02-06: 1 [IU] via SUBCUTANEOUS
  Administered 2016-02-07: 2 [IU] via SUBCUTANEOUS
  Administered 2016-02-07 – 2016-02-08 (×3): 7 [IU] via SUBCUTANEOUS
  Administered 2016-02-09: 3 [IU] via SUBCUTANEOUS

## 2016-02-04 MED ORDER — GABAPENTIN ENACARBIL ER 600 MG PO TBCR: 300.0000 mg | EXTENDED_RELEASE_TABLET | Freq: Two times a day (BID) | ORAL | Status: DC

## 2016-02-04 MED ORDER — SODIUM CHLORIDE 0.9 % IV BOLUS (SEPSIS)
1000.0000 mL | Freq: Once | INTRAVENOUS | Status: AC
  Administered 2016-02-04: 1000 mL via INTRAVENOUS

## 2016-02-04 MED ORDER — DARIFENACIN HYDROBROMIDE ER 7.5 MG PO TB24
7.5000 mg | ORAL_TABLET | Freq: Every day | ORAL | Status: DC
  Administered 2016-02-04 – 2016-02-12 (×7): 7.5 mg via ORAL
  Filled 2016-02-04 (×9): qty 1

## 2016-02-04 MED ORDER — CEFAZOLIN SODIUM-DEXTROSE 2-4 GM/100ML-% IV SOLN: 2.0000 g | INTRAVENOUS | Status: DC

## 2016-02-04 MED ORDER — ACETAMINOPHEN 650 MG RE SUPP: 650.0000 mg | Freq: Four times a day (QID) | RECTAL | Status: DC | PRN

## 2016-02-04 MED ORDER — POTASSIUM CHLORIDE CRYS ER 20 MEQ PO TBCR
40.0000 meq | EXTENDED_RELEASE_TABLET | Freq: Once | ORAL | Status: AC
  Administered 2016-02-04: 40 meq via ORAL
  Filled 2016-02-04: qty 2

## 2016-02-04 MED ORDER — METRONIDAZOLE IN NACL 5-0.79 MG/ML-% IV SOLN
500.0000 mg | Freq: Once | INTRAVENOUS | Status: AC
  Administered 2016-02-04: 500 mg via INTRAVENOUS
  Filled 2016-02-04: qty 100

## 2016-02-04 MED ORDER — IOPAMIDOL (ISOVUE-300) INJECTION 61%
INTRAVENOUS | Status: AC
  Filled 2016-02-04: qty 100

## 2016-02-04 MED ORDER — GABAPENTIN 300 MG PO CAPS
300.0000 mg | ORAL_CAPSULE | Freq: Two times a day (BID) | ORAL | Status: DC
  Administered 2016-02-04 – 2016-02-12 (×14): 300 mg via ORAL
  Filled 2016-02-04 (×14): qty 1

## 2016-02-04 MED ORDER — ACETAMINOPHEN 325 MG PO TABS: 650.0000 mg | ORAL_TABLET | Freq: Four times a day (QID) | ORAL | Status: DC | PRN

## 2016-02-04 MED ORDER — INSULIN DETEMIR 100 UNIT/ML ~~LOC~~ SOLN
10.0000 [IU] | Freq: Every day | SUBCUTANEOUS | Status: DC
  Administered 2016-02-04: 10 [IU] via SUBCUTANEOUS
  Filled 2016-02-04 (×2): qty 0.1

## 2016-02-04 MED ORDER — CIPROFLOXACIN IN D5W 400 MG/200ML IV SOLN
400.0000 mg | Freq: Two times a day (BID) | INTRAVENOUS | Status: DC
  Administered 2016-02-05 – 2016-02-07 (×5): 400 mg via INTRAVENOUS
  Filled 2016-02-04 (×5): qty 200

## 2016-02-04 MED ORDER — ONDANSETRON HCL 4 MG PO TABS: 4.0000 mg | ORAL_TABLET | Freq: Four times a day (QID) | ORAL | Status: DC | PRN

## 2016-02-04 MED ORDER — SODIUM CHLORIDE 0.9 % IV SOLN
INTRAVENOUS | Status: DC
  Administered 2016-02-04: 22:00:00 via INTRAVENOUS

## 2016-02-04 MED ORDER — CIPROFLOXACIN IN D5W 400 MG/200ML IV SOLN
400.0000 mg | Freq: Once | INTRAVENOUS | Status: DC
  Filled 2016-02-04: qty 200

## 2016-02-04 MED ORDER — ONDANSETRON HCL 4 MG/2ML IJ SOLN: 4.0000 mg | Freq: Four times a day (QID) | INTRAMUSCULAR | Status: DC | PRN

## 2016-02-04 MED ORDER — INSULIN ASPART 100 UNIT/ML ~~LOC~~ SOLN
0.0000 [IU] | Freq: Every day | SUBCUTANEOUS | Status: DC
  Administered 2016-02-04: 5 [IU] via SUBCUTANEOUS
  Administered 2016-02-05: 4 [IU] via SUBCUTANEOUS
  Administered 2016-02-06: 5 [IU] via SUBCUTANEOUS
  Administered 2016-02-07: 3 [IU] via SUBCUTANEOUS
  Administered 2016-02-08: 4 [IU] via SUBCUTANEOUS
  Administered 2016-02-09: 2 [IU] via SUBCUTANEOUS

## 2016-02-04 MED ORDER — MOMETASONE FURO-FORMOTEROL FUM 100-5 MCG/ACT IN AERO
2.0000 | INHALATION_SPRAY | Freq: Two times a day (BID) | RESPIRATORY_TRACT | Status: DC
  Administered 2016-02-05 – 2016-02-12 (×6): 2 via RESPIRATORY_TRACT
  Filled 2016-02-04: qty 8.8

## 2016-02-04 MED ORDER — KETOCONAZOLE 2 % EX CREA
1.0000 "application " | TOPICAL_CREAM | Freq: Two times a day (BID) | CUTANEOUS | Status: DC | PRN
  Filled 2016-02-04: qty 15

## 2016-02-04 MED ORDER — SODIUM CHLORIDE 0.9% FLUSH
3.0000 mL | Freq: Two times a day (BID) | INTRAVENOUS | Status: DC
  Administered 2016-02-05: 3 mL via INTRAVENOUS
  Administered 2016-02-05: 11:00:00 via INTRAVENOUS
  Administered 2016-02-07 – 2016-02-12 (×9): 3 mL via INTRAVENOUS

## 2016-02-04 MED ORDER — METRONIDAZOLE IN NACL 5-0.79 MG/ML-% IV SOLN
500.0000 mg | Freq: Three times a day (TID) | INTRAVENOUS | Status: DC
  Administered 2016-02-05 – 2016-02-07 (×6): 500 mg via INTRAVENOUS
  Filled 2016-02-04 (×6): qty 100

## 2016-02-04 NOTE — ED Notes (Signed)
Precautions sign placed on the door

## 2016-02-04 NOTE — ED Notes (Signed)
Attempted report x 2 

## 2016-02-04 NOTE — Progress Notes (Signed)
Blood sugar >400. Serum glucose ordered. On call Provider paged awaiting return call.

## 2016-02-04 NOTE — Progress Notes (Signed)
Pt arrived c/o of n/v, diarrhea with blood passed x 2 days  cbg 265  BP 85/45 p. 120. Dr Sharol Given notified and pt was taken to ED for evaluation. Surgery cancelled.report given to ED staff.

## 2016-02-04 NOTE — ED Provider Notes (Signed)
CSN: [id number]     Arrival date & time 02/04/16  1448 History   First MD Initiated Contact with Patient 02/04/16 1500     Chief Complaint  Patient presents with  . Hypotension  . Rectal Bleeding     (Consider location/radiation/quality/duration/timing/severity/associated sxs/prior Treatment) HPI  75 year old female presents with acute GI bleeding. [place] with near-syncope 3 days ago and broke her ankle. Was in short stay for surgery today and her BP was in the 60s. Endorses multiple bloody BMs over past couple days. Developed diarrhea 2 days ago and then turned into pink and then red. Some abdominal pain when she presses but has not had pain otherwise. No dizziness now. No chest pain/dyspnea. Surgery was cancelled and she was sent down to ED.  Past Medical History  Diagnosis Date  . Hypertension   . Rheumatoid arthritis ([place])   . GERD (gastroesophageal reflux disease)     no meds for  . Hx of radiation therapy 12/16/13-01/30/14    lung 66Gy  . Neuropathy ([place])   . Shortness of breath dyspnea     with exertion  . History of hiatal hernia   . Diabetes mellitus     insulin  . Cancer ([place]) dx'd 09/2013    Lung ca  . Malignant neoplasm of right upper lobe of lung ([place]) 11/28/2013  . Psoriasis    Past Surgical History  Procedure Laterality Date  . Cholecystectomy    . Abdominal hysterectomy    . Dilation and curettage of uterus    . Abdominal surgery    . Tonsillectomy    . Esophagogastroduodenoscopy (egd) with propofol N/A 10/09/2014    Procedure: ESOPHAGOGASTRODUODENOSCOPY (EGD) WITH PROPOFOL;  Surgeon: Inda Castle, MD;  Location: [facility];  Service: Endoscopy;  Laterality: N/A;  . Savory dilation N/A 10/09/2014    Procedure: SAVORY DILATION;  Surgeon: Inda Castle, MD;  Location: [facility];  Service: Endoscopy;  Laterality: N/A;  . Esophagogastroduodenoscopy N/A 12/12/2014    Procedure: ESOPHAGOGASTRODUODENOSCOPY (EGD);  Surgeon: Inda Castle, MD;  Location: [facility];  Service: Endoscopy;  Laterality: N/A;  with dilation  . Balloon dilation N/A 12/12/2014    Procedure: BALLOON DILATION;  Surgeon: Inda Castle, MD;  Location: [facility];  Service: Endoscopy;  Laterality: N/A;  . Orif ankle fracture Right 07/04/2015    Procedure: OPEN REDUCTION INTERNAL FIXATION (ORIF) ANKLE FRACTURE;  Surgeon: Newt Minion, MD;  Location: [facility];  Service: Orthopedics;  Laterality: Right;  OPEN REDUCTION, INTERNAL FIXATION OF RIGHT ANKLE FRACTURE.    Family History  Problem Relation Age of Onset  . Hypertension Other   . Stroke Other   . Heart attack Other   . Hemachromatosis Other   . Rheum arthritis      both sets of grandparents and father  . Other Mother   . Other Father     failure to thrive   Social History  Substance Use Topics  . Smoking status: Former Smoker -- 3.00 packs/day for 45 years    Types: Cigarettes    Quit date: 12/05/1997  . Smokeless tobacco: Never Used  . Alcohol Use: No   OB History    No data available     Review of Systems  Constitutional: Negative for fever.  Respiratory: Negative for shortness of breath.   Cardiovascular: Negative for chest pain.  Gastrointestinal: Positive for abdominal pain, diarrhea and blood in stool. Negative for vomiting.  Neurological: Negative for dizziness, weakness and light-headedness.  All other systems reviewed and are negative.     Allergies  Carboplatin; Remicade; and Hydromorphone  Home Medications   Prior to Admission medications   Medication Sig Start Date End Date Taking? Authorizing Provider  B-D ULTRAFINE III SHORT PEN 31G X 8 MM MISC  10/28/14   Historical Provider, MD  fluticasone (CUTIVATE) 0.05 % cream Apply 1 application topically 2 (two) times daily as needed (rash).     Historical Provider, MD  Fluticasone-Salmeterol (ADVAIR DISKUS) 100-50 MCG/DOSE AEPB Inhale 1 puff into the lungs 2 (two) times daily. Patient taking differently: Inhale 1 puff into the lungs 2  (two) times daily as needed (sob and wheezing).  08/27/14   Brand Males, MD  Gabapentin Enacarbil (HORIZANT) 600 MG TBCR Take 1,200 mg by mouth daily. As directed Patient taking differently: Take 600 mg by mouth 2 (two) times daily. As directed 12/14/15   Melvenia Beam, MD  HUMALOG KWIKPEN 100 UNIT/ML KiwkPen Inject 6-10 Units into the skin 3 (three) times daily. Takes 6 units in the morning and at lunchtime, then takes 10 units in the evening 10/07/15   Historical Provider, MD  HYDROcodone-acetaminophen (NORCO/VICODIN) 5-325 MG tablet Take 1 tablet by mouth every 6 (six) hours as needed for moderate pain. 02/01/16   Leo Grosser, MD  insulin detemir (LEVEMIR) 100 UNIT/ML injection Inject 34 Units into the skin at bedtime.     Historical Provider, MD  ketoconazole (NIZORAL) 2 % cream Apply 1 application topically 2 (two) times daily as needed for irritation.  01/04/16   Historical Provider, MD  losartan-hydrochlorothiazide (HYZAAR) 100-25 MG tablet Take 1 tablet by mouth daily. 10/13/15   Historical Provider, MD  Jonetta Speak LANCETS FINE [place]  02/04/15   Historical Provider, MD  potassium chloride SA (K-DUR,KLOR-CON) 20 MEQ tablet Take 40 mEq by mouth daily.    Historical Provider, MD   BP 95/62 mmHg  Pulse 118  Temp(Src) 97.6 F (36.4 C) (Oral)  Resp 21  SpO2 97% Physical Exam  Constitutional: She is oriented to person, place, and time. She appears well-developed and well-nourished.  HENT:  Head: Normocephalic and atraumatic.  Right Ear: External ear normal.  Left Ear: External ear normal.  Nose: Nose normal.  Eyes: Right eye exhibits no discharge. Left eye exhibits no discharge.  Cardiovascular: Normal rate, regular rhythm and normal heart sounds.   Pulmonary/Chest: Effort normal and breath sounds normal.  Abdominal: Soft. She exhibits no distension. There is tenderness (lower).  Genitourinary: Rectal exam shows no external hemorrhoid and no fissure. Guaiac positive stool.  Small  amount of pink on glove in rectal exam. No stool  Neurological: She is alert and oriented to person, place, and time.  Skin: Skin is warm and dry.  Nursing note and vitals reviewed.   ED Course  Procedures (including critical care time) Labs Review Labs Reviewed  COMPREHENSIVE METABOLIC PANEL - Abnormal; Notable for the following:    Sodium 134 (*)    Potassium 3.0 (*)    Chloride 95 (*)    Glucose, Bld 312 (*)    Creatinine, Ser 1.69 (*)    Total Protein 6.3 (*)    Albumin 3.0 (*)    ALT 13 (*)    GFR calc non Af Amer 29 (*)    GFR calc Af Amer 33 (*)    All other components within normal limits  CBC - Abnormal; Notable for the following:    WBC 10.9 (*)    RBC 5.15 (*)  Hemoglobin 15.1 (*)    All other components within normal limits  GLUCOSE, CAPILLARY - Abnormal; Notable for the following:    Glucose-Capillary 422 (*)    All other components within normal limits  POC OCCULT BLOOD, ED - Abnormal; Notable for the following:    Fecal Occult Bld POSITIVE (*)    All other components within normal limits  I-STAT CG4 LACTIC ACID, ED - Abnormal; Notable for the following:    Lactic Acid, Venous 2.13 (*)    All other components within normal limits  I-STAT CHEM 8, ED - Abnormal; Notable for the following:    Sodium 133 (*)    Potassium 2.9 (*)    Chloride 93 (*)    BUN 21 (*)    Creatinine, Ser 1.40 (*)    Glucose, Bld 302 (*)    Calcium, Ion 1.06 (*)    Hemoglobin 17.3 (*)    HCT 51.0 (*)    All other components within normal limits  C DIFFICILE QUICK SCREEN W PCR REFLEX  GASTROINTESTINAL PANEL BY PCR, STOOL (REPLACES STOOL CULTURE)  PROTIME-INR  CBC  LACTIC ACID, PLASMA  CBC  COMPREHENSIVE METABOLIC PANEL  BASIC METABOLIC PANEL  CBC  TYPE AND SCREEN  ABO/RH    Imaging Review Ct Abdomen Pelvis Wo Contrast  02/04/2016  CLINICAL DATA:  Lower abdominal pain with gas and bloating, diarrhea for 4 days, bright red blood in stool, rectal bleeding, hypertension,  rheumatoid arthritis, lung cancer post radiation therapy, diabetes mellitus, former smoker EXAM: CT ABDOMEN AND PELVIS WITHOUT CONTRAST TECHNIQUE: Multidetector CT imaging of the abdomen and pelvis was performed following the standard protocol without IV contrast. Sagittal and coronal MPR images reconstructed from axial data set. COMPARISON:  PET-CT 11/14/2013, chest CT 11/30/2015 FINDINGS: Soft tissue density adjacent to RIGHT heart border likely reflects RIGHT middle lobe atelectasis, unchanged. Calcified granuloma LEFT lower lobe image 6 with additional tiny LEFT lower lobe nodule image 9, stable. Calcified granulomata within spleen. 9 mm cyst LEFT lobe liver image 11 unchanged. Questionable 7 mm subcapsular lesion lateral RIGHT lobe liver image 34. Lesion anterior lateral segment LEFT lobe liver 19 x 13 mm image 19 previously 14 x 9 mm. Gallbladder surgically absent. Remainder of liver, spleen, pancreas, kidneys, and adrenal glands normal. Wall thickening of ascending colon through distal transverse colon consistent with colitis. Associated significant hyperemia of transverse mesocolon. Cecum and distal colon appear unaffected. Stomach and small bowel loops normal appearance. Normal appendix Uterus surgically absent with nonvisualization of ovaries. No additional mass, adenopathy, free air free fluid. Bladder and ureters normal appearance. Scattered atherosclerotic calcifications. No acute osseous findings. IMPRESSION: Diffuse wall thickening of the ascending through distal transverse colon with associated hyperemia of the mesocolon compatible with colitis ; differential diagnosis includes infection and inflammatory bowel disease, ischemia less likely with this distribution though not excluded. Interval increase in size of a lesion at the lateral segment LEFT lobe liver question hepatic metastasis. Electronically Signed   By: Lavonia Dana M.D.   On: 02/04/2016 16:47   Dg Chest Port 1 View  02/04/2016  CLINICAL  DATA:  Cough, hypotension and diarrhea. History of squamous cell carcinoma of the lung. EXAM: PORTABLE CHEST 1 VIEW COMPARISON:  CT of the chest on 11/30/2015 FINDINGS: There is chronic volume loss of the right lung with dense chronic atelectasis and consolidation of the right middle lobe. No edema, infiltrate or pneumothorax identified. No pleural effusions are seen. The heart size is normal. IMPRESSION: Chronic right middle lobe  atelectasis and consolidation. Electronically Signed   By: Aletta Edouard M.D.   On: 02/04/2016 22:02   I have personally reviewed and evaluated these images and lab results as part of my medical decision-making.   EKG Interpretation None      MDM   Final diagnoses:  Rectal bleed  Abdominal pain  Cough  Acute Kidney Injury  Patient's hypotension resolved after IV fluid bolus. Patient's blood work shows acute kidney injury. Likely her dehydration/hypotension is from fluid loss. She does have some pink blood on rectal exam but her hemoglobin is normal. I do not think that her hypotension is from severe blood loss. She will be started on antibiotics given colitis seen on CT scan. With her diarrhea I favor this to be infectious. She has been on antibiotics recently and so this will order a C. difficile study as well. Admit to the hospitalist. Blood pressure is now normal.    Sherwood Gambler, MD 02/05/16 0006

## 2016-02-04 NOTE — H&P (Signed)
[facility] History and Physical  OLUWADAMILOLA [name] LEX:[id number] DOB: 02-Jul-1941 DOA: 02/04/2016   PCP: [name], MD  Specialists: Dr. Julien Nordmann is her oncologist  Chief Complaint: Low blood pressure and diarrhea  HPI: Megan [name] is a 75 y.o. female with a past medical history of diabetes on insulin, stage III lung cancer followed by oncology, history of rheumatoid arthritis, history of orthostatic hypotension who was in her usual state of health till this past Monday when she had an episode of syncope. Patient tells me that that she has orthostatic hypotension, which causes her to pass out once in a while. As a result of that syncopal episode, patient sustained a fracture of her left ankle. She was supposed to undergo surgery today. When she presented to the surgical center she was found to have low blood pressure. She was sent over to the emergency department. She tells me that for the past 3 days she's been having diarrhea. She has been taking Imodium. She's had multiple loose stools. She has seen some blood in the stool. She's had dry heaves. She saw one speck of blood when she dry heaved today. Denies any abdominal pain. No fever, no chills. She was in [place], [place] last weekend. She ate at a [employer] this past weekend at [facility] and thinks that this could be the reason for her diarrhea. Denies any recent antibiotic use. She has had episodes of diarrhea in the past which have been negative for C. difficile. The last episode was in 2016. Apparently this was treated with some form of steroid. Exact diagnosis is unknown.  In the emergency department, patient was initially found to be hypotensive and tachycardic. She was noted to have acute renal failure. She was given IV fluids with which her heart rate improved and her blood pressure improved. CT scan showed findings suggestive of colitis. She'll be brought in for further management.  Home  Medications: Prior to Admission medications   Medication Sig Start Date End Date Taking? Authorizing Provider  fluticasone (CUTIVATE) 0.05 % cream Apply 1 application topically 2 (two) times daily as needed (rash).    Yes Historical Provider, MD  Fluticasone-Salmeterol (ADVAIR DISKUS) 100-50 MCG/DOSE AEPB Inhale 1 puff into the lungs 2 (two) times daily. Patient taking differently: Inhale 1 puff into the lungs 2 (two) times daily as needed (sob and wheezing).  08/27/14  Yes Brand Males, MD  Gabapentin Enacarbil (HORIZANT) 600 MG TBCR Take 1,200 mg by mouth daily. As directed Patient taking differently: Take 600 mg by mouth 2 (two) times daily. As directed 12/14/15  Yes Melvenia Beam, MD  HUMALOG KWIKPEN 100 UNIT/ML KiwkPen Inject 6-10 Units into the skin 3 (three) times daily. Takes 6 units in the morning and at lunchtime, then takes 10 units in the evening 10/07/15  Yes Historical Provider, MD  HYDROcodone-acetaminophen (NORCO/VICODIN) 5-325 MG tablet Take 1 tablet by mouth every 6 (six) hours as needed for moderate pain. 02/01/16  Yes Leo Grosser, MD  insulin detemir (LEVEMIR) 100 UNIT/ML injection Inject 34 Units into the skin at bedtime.    Yes Historical Provider, MD  ketoconazole (NIZORAL) 2 % cream Apply 1 application topically 2 (two) times daily as needed for irritation.  01/04/16  Yes Historical Provider, MD  losartan-hydrochlorothiazide (HYZAAR) 100-25 MG tablet Take 1 tablet by mouth daily. 10/13/15  Yes Historical Provider, MD  potassium chloride SA (K-DUR,KLOR-CON) 20 MEQ tablet Take 40 mEq by mouth daily.   Yes Historical Provider, MD  VESICARE 5  MG tablet Take 5 mg by mouth daily. 01/09/16  Yes Historical Provider, MD    Allergies:  Allergies  Allergen Reactions  . Carboplatin Other (See Comments)    Neuropathy  . Remicade [Infliximab] Anaphylaxis  . Hydromorphone Rash    Past Medical History: Past Medical History  Diagnosis Date  . Hypertension   . Rheumatoid arthritis  ([place])   . GERD (gastroesophageal reflux disease)     no meds for  . Hx of radiation therapy 12/16/13-01/30/14    lung 66Gy  . Neuropathy ([place])   . Shortness of breath dyspnea     with exertion  . History of hiatal hernia   . Diabetes mellitus     insulin  . Cancer ([place]) dx'd 09/2013    Lung ca  . Malignant neoplasm of right upper lobe of lung ([place]) 11/28/2013  . Psoriasis     Past Surgical History  Procedure Laterality Date  . Cholecystectomy    . Abdominal hysterectomy    . Dilation and curettage of uterus    . Abdominal surgery    . Tonsillectomy    . Esophagogastroduodenoscopy (egd) with propofol N/A 10/09/2014    Procedure: ESOPHAGOGASTRODUODENOSCOPY (EGD) WITH PROPOFOL;  Surgeon: Inda Castle, MD;  Location: [facility];  Service: Endoscopy;  Laterality: N/A;  . Savory dilation N/A 10/09/2014    Procedure: SAVORY DILATION;  Surgeon: Inda Castle, MD;  Location: [facility];  Service: Endoscopy;  Laterality: N/A;  . Esophagogastroduodenoscopy N/A 12/12/2014    Procedure: ESOPHAGOGASTRODUODENOSCOPY (EGD);  Surgeon: Inda Castle, MD;  Location: [facility];  Service: Endoscopy;  Laterality: N/A;  with dilation  . Balloon dilation N/A 12/12/2014    Procedure: BALLOON DILATION;  Surgeon: Inda Castle, MD;  Location: [facility];  Service: Endoscopy;  Laterality: N/A;  . Orif ankle fracture Right 07/04/2015    Procedure: OPEN REDUCTION INTERNAL FIXATION (ORIF) ANKLE FRACTURE;  Surgeon: Newt Minion, MD;  Location: [facility];  Service: Orthopedics;  Laterality: Right;  OPEN REDUCTION, INTERNAL FIXATION OF RIGHT ANKLE FRACTURE.     Social History: She lives in [place] by herself. Denies smoking currently. No history of alcohol use. She is a past smoker. Usually independent with daily activities.  Family History:  Family History  Problem Relation Age of Onset  . Hypertension Other   . Stroke Other   . Heart attack Other   . Hemachromatosis Other   . Rheum arthritis        both sets of grandparents and father  . Other Mother   . Other Father     failure to thrive     Review of Systems - History obtained from the patient General ROS: positive for  - fatigue Psychological ROS: negative Ophthalmic ROS: negative ENT ROS: negative Allergy and Immunology ROS: negative Hematological and Lymphatic ROS: negative Endocrine ROS: negative Respiratory ROS: cough Cardiovascular ROS: no chest pain or dyspnea on exertion Gastrointestinal ROS: as in hpi Genito-Urinary ROS: no dysuria, trouble voiding, or hematuria Musculoskeletal ROS: left ankle pain Neurological ROS: no TIA or stroke symptoms Dermatological ROS: psoriasis  Physical Examination  Filed Vitals:   02/04/16 1721 02/04/16 1730 02/04/16 1815 02/04/16 1816  BP:  110/64 114/58   Pulse: 98   97  Temp:      TempSrc:      Resp: '15 17 17 17  ' SpO2: 100%   100%    BP 114/58 mmHg  Pulse 97  Temp(Src) 97.6 F (36.4 C) (Oral)  Resp  17  SpO2 100%  General appearance: alert, cooperative, appears stated age and no distress Head: Normocephalic, without obvious abnormality, atraumatic Eyes: conjunctivae/corneas clear. PERRL, EOM's intact.  Throat: dry mm Neck: no adenopathy, no carotid bruit, no JVD, supple, symmetrical, trachea midline and thyroid not enlarged, symmetric, no tenderness/mass/nodules Resp: Diminished air entry the bases. No crackles or wheezing. Cardio: regular rate and rhythm, S1, S2 normal, no murmur, click, rub or gallop GI: Abdomen is soft. Slightly tender in the lower quadrants without any rebound, rigidity or guarding. No masses or organomegaly. Extremities: Left lower leg is in a boot Pulses: 2+ and symmetric Skin: Skin color, texture, turgor normal. No rashes or lesions Lymph nodes: Cervical, supraclavicular, and axillary nodes normal. Neurologic: Awake and alert. Oriented 3. No focal neurological deficits.  Laboratory Data: Results for orders placed or performed during  the hospital encounter of 02/04/16 (from the past 48 hour(s))  POC occult blood, ED     Status: Abnormal   Collection Time: 02/04/16  3:13 PM  Result Value Ref Range   Fecal Occult Bld POSITIVE (A) NEGATIVE  Comprehensive metabolic panel     Status: Abnormal   Collection Time: 02/04/16  3:25 PM  Result Value Ref Range   Sodium 134 (L) 135 - 145 mmol/L   Potassium 3.0 (L) 3.5 - 5.1 mmol/L   Chloride 95 (L) 101 - 111 mmol/L   CO2 24 22 - 32 mmol/L   Glucose, Bld 312 (H) 65 - 99 mg/dL   BUN 18 6 - 20 mg/dL   Creatinine, Ser 1.69 (H) 0.44 - 1.00 mg/dL   Calcium 9.1 8.9 - 10.3 mg/dL   Total Protein 6.3 (L) 6.5 - 8.1 g/dL   Albumin 3.0 (L) 3.5 - 5.0 g/dL   AST 17 15 - 41 U/L   ALT 13 (L) 14 - 54 U/L   Alkaline Phosphatase 119 38 - 126 U/L   Total Bilirubin 1.1 0.3 - 1.2 mg/dL   GFR calc non Af Amer 29 (L) >60 mL/min   GFR calc Af Amer 33 (L) >60 mL/min    Comment: (NOTE) The eGFR has been calculated using the CKD EPI equation. This calculation has not been validated in all clinical situations. eGFR's persistently <60 mL/min signify possible Chronic Kidney Disease.    Anion gap 15 5 - 15  CBC     Status: Abnormal   Collection Time: 02/04/16  3:25 PM  Result Value Ref Range   WBC 10.9 (H) 4.0 - 10.5 K/uL   RBC 5.15 (H) 3.87 - 5.11 MIL/uL   Hemoglobin 15.1 (H) 12.0 - 15.0 g/dL   HCT 46.0 36.0 - 46.0 %   MCV 89.3 78.0 - 100.0 fL   MCH 29.3 26.0 - 34.0 pg   MCHC 32.8 30.0 - 36.0 g/dL   RDW 14.8 11.5 - 15.5 %   Platelets 252 150 - 400 K/uL  Type and screen [place]     Status: None   Collection Time: 02/04/16  3:25 PM  Result Value Ref Range   ABO/RH(D) O NEG    Antibody Screen NEG    Sample Expiration 02/07/2016   Protime-INR     Status: None   Collection Time: 02/04/16  3:25 PM  Result Value Ref Range   Prothrombin Time 12.5 11.6 - 15.2 seconds   INR 0.91 0.00 - 1.49  ABO/Rh     Status: None   Collection Time: 02/04/16  3:25 PM  Result Value Ref Range  ABO/RH(D) O NEG   I-Stat CG4 Lactic Acid, ED     Status: Abnormal   Collection Time: 02/04/16  3:34 PM  Result Value Ref Range   Lactic Acid, Venous 2.13 (HH) 0.5 - 2.0 mmol/L   Comment NOTIFIED PHYSICIAN   I-stat Chem 8, ED     Status: Abnormal   Collection Time: 02/04/16  3:34 PM  Result Value Ref Range   Sodium 133 (L) 135 - 145 mmol/L   Potassium 2.9 (L) 3.5 - 5.1 mmol/L   Chloride 93 (L) 101 - 111 mmol/L   BUN 21 (H) 6 - 20 mg/dL   Creatinine, Ser 1.40 (H) 0.44 - 1.00 mg/dL   Glucose, Bld 302 (H) 65 - 99 mg/dL   Calcium, Ion 1.06 (L) 1.13 - 1.30 mmol/L   TCO2 24 0 - 100 mmol/L   Hemoglobin 17.3 (H) 12.0 - 15.0 g/dL   HCT 51.0 (H) 36.0 - 46.0 %    Radiology Reports: Ct Abdomen Pelvis Wo Contrast  02/04/2016  CLINICAL DATA:  Lower abdominal pain with gas and bloating, diarrhea for 4 days, bright red blood in stool, rectal bleeding, hypertension, rheumatoid arthritis, lung cancer post radiation therapy, diabetes mellitus, former smoker EXAM: CT ABDOMEN AND PELVIS WITHOUT CONTRAST TECHNIQUE: Multidetector CT imaging of the abdomen and pelvis was performed following the standard protocol without IV contrast. Sagittal and coronal MPR images reconstructed from axial data set. COMPARISON:  PET-CT 11/14/2013, chest CT 11/30/2015 FINDINGS: Soft tissue density adjacent to RIGHT heart border likely reflects RIGHT middle lobe atelectasis, unchanged. Calcified granuloma LEFT lower lobe image 6 with additional tiny LEFT lower lobe nodule image 9, stable. Calcified granulomata within spleen. 9 mm cyst LEFT lobe liver image 11 unchanged. Questionable 7 mm subcapsular lesion lateral RIGHT lobe liver image 34. Lesion anterior lateral segment LEFT lobe liver 19 x 13 mm image 19 previously 14 x 9 mm. Gallbladder surgically absent. Remainder of liver, spleen, pancreas, kidneys, and adrenal glands normal. Wall thickening of ascending colon through distal transverse colon consistent with colitis. Associated  significant hyperemia of transverse mesocolon. Cecum and distal colon appear unaffected. Stomach and small bowel loops normal appearance. Normal appendix Uterus surgically absent with nonvisualization of ovaries. No additional mass, adenopathy, free air free fluid. Bladder and ureters normal appearance. Scattered atherosclerotic calcifications. No acute osseous findings. IMPRESSION: Diffuse wall thickening of the ascending through distal transverse colon with associated hyperemia of the mesocolon compatible with colitis ; differential diagnosis includes infection and inflammatory bowel disease, ischemia less likely with this distribution though not excluded. Interval increase in size of a lesion at the lateral segment LEFT lobe liver question hepatic metastasis. Electronically Signed   By: Lavonia Dana M.D.   On: 02/04/2016 16:47     Problem List  Principal Problem:   Acute colitis Active Problems:   Malignant neoplasm of right upper lobe of lung (HCC)   Neuropathy due to chemotherapeutic drug ([place])   Hypotension   Hypokalemia   Rheumatoid arthritis ([place])   Acute kidney injury ([place])   DM type 2 (diabetes mellitus, type 2) ([place])   Assessment: This is a 75 year old Caucasian female with past medical history as stated earlier, who presents with a 2-3 day history of diarrhea and was found to be hypotensive in the operating room today. She is very dehydrated and has acute renal failure. She is found to have colitis on CT scan.  Plan: #1 acute colitis with the diarrhea and mild hematochezia: She noticed some blood in the  stool as well. We will monitor CBCs closely. She will be given Cipro and Flagyl for now. Stool studies have been ordered. She has been taking Imodium at home. She's had only 1 episode today before she came in to the hospital. Her abdomen is benign. Lactic acid is noted to be slightly elevated and will be repeated.  #2 Hypotension: Secondary to hypovolemia. Improved with the  aggressive IV hydration. IV fluids will be continued  #3 Acute renal failure with hypokalemia: Most likely secondary to diarrhea and hypovolemia. She'll be given IV fluids. Labs will be repeated tomorrow morning. Replace potassium.  #4 Cough: Patient was noted to have a dry cough when I was examining her. Lungs are clear. She does have a history of lung cancer and emphysema. We will get a chest x-ray to further evaluate.  #5 Left ankle fracture: This was sustained on Monday when she had a syncopal episode. She was supposed to be operated on today by Dr. Sharol Given. Surgeries on 4 for now. Pain control.  #6 history of diabetes mellitus type 2 on insulin: Continue with Levemir at a lower dose. Sliding scale insulin coverage. Monitor CBGs.  #7 history of lung cancer: Followed by oncology. Currently off treatment. She developed psoriasis as a result of her immunotreatment.  #8 history of orthostatic hypotension and episodes of syncope: Monitor on telemetry. Will need continued evaluation as outpatient if necessary. Defer to PCP.  DVT Prophylaxis: SCDs Code Status: Full code Family Communication: Discussed with the patient  Disposition Plan: Admit to telemetry   Further management decisions will depend on results of further testing and patient's response to treatment.   [facility]  [facility] Pager [phone]  If 7PM-7AM, please contact night-coverage [email or web address] Password [email or web address]  02/04/2016, 6:24 PM

## 2016-02-04 NOTE — ED Notes (Addendum)
Attempted report x 1.  RN not assigned yet

## 2016-02-04 NOTE — Progress Notes (Signed)
Anesthesia Brief PAT Note: Patient is a 75 year old female was scheduled to undergo ORIF left fibula fracture on 02/05/16 by Dr. Sharol Given. However, when she arrived today she reported N/V/D, hematochezia (passing blood "clots" in her stool X 2 days), feeling faint and was tachycardic (120 bpm) and hypotensive (BP 85/46). I was asked to evaluate her urgently. When I arrived, patient was sitting upright in a hospital wheelchair. She was alert and cooperative. Faintness had improved. She did appear a little pale, although PAT RN felt her color looked improved from when she initially saw patient. Vitals were rechecked and by machine BP was 66/33, HR 126. Manual BP was 68/48. Due to her hypotension, tachycardia, and new onset hematochezia I recommended evaluation in the ED. PAT RN had already discussed with Dr. Sharol Given who also felt patient should be seen in the ED. He is also planning to postpone surgery. Patient transported to the ED earlier this afternoon and report given to ED RN.   History includes metastatic NSC squamous cell lung cancer s/p 33 cycles of Nivolumab immunotherapy, chemo-induced neuropathy, RA, HTN, GERD, hiatal hernia, DM on insulin, ORIF right ankle fracture '16. She had pre-syncope/fall on 02/01/16 and fractured her left fibula.  PCP is Dr. Wilson Singer. HEM-ONC is Dr. Mckinley Jewel. Pulmonologist is Dr. Chase Caller.  84/3/17 EKG: ST at 109 bpm, probable LAE.  11/06/15 Echo: Study Conclusions - Left ventricle: The cavity size was normal. Systolic function was  normal. The estimated ejection fraction was in the range of 60%  to 65%. Wall motion was normal; there were no regional wall  motion abnormalities. Due to tachycardia, there was fusion of  early and atrial contributions to ventricular filling. The study  is not technically sufficient to allow evaluation of LV diastolic  function.  11/30/15 Chest CT: IMPRESSION: 1. Chronic collapse/consolidation in the right upper and right middle lobes with  architectural distortion in the right lower lobe, likely treatment related. Findings are unchanged from recent prior examinations. 2. Scattered and somewhat amorphous ground-glass nodularity in the left lung, also stable. 3. Coronary artery calcification.  11/13/13 PFTs: FVC 3.05 (99%), FEV1 2.35 (102%), DLCOunc 17.32 (67%).  Labs from 02/01/16 noted. Cr 0.87. H/H WNL. Troponin negative. Glucose 347.    Patient currently undergoing evaluation in the ED. Dr. Sharol Given to reschedule surgery once she is felt medically stable.   George Hugh [facility] Short Stay Center/Anesthesiology Phone [phone] 02/04/2016 3:43 PM

## 2016-02-04 NOTE — ED Notes (Signed)
Admitting at bedside 

## 2016-02-04 NOTE — ED Notes (Signed)
Pt from short stay with c/o hypotension SBP 60s per pre op RN and PA.  Pt reports she started passing blood clots rectally yesterday.  Pt was suppose to have left leg fracture surgery tomorrow.  NAD, A&O.

## 2016-02-05 ENCOUNTER — Encounter (HOSPITAL_COMMUNITY): Admission: RE | Payer: Self-pay | Source: Ambulatory Visit

## 2016-02-05 ENCOUNTER — Ambulatory Visit (HOSPITAL_COMMUNITY): Admission: RE | Admit: 2016-02-05 | Payer: Medicare Other | Source: Ambulatory Visit | Admitting: Orthopedic Surgery

## 2016-02-05 ENCOUNTER — Encounter (HOSPITAL_COMMUNITY): Payer: Self-pay | Admitting: General Practice

## 2016-02-05 DIAGNOSIS — K921 Melena: Secondary | ICD-10-CM | POA: Diagnosis not present

## 2016-02-05 DIAGNOSIS — R933 Abnormal findings on diagnostic imaging of other parts of digestive tract: Secondary | ICD-10-CM | POA: Diagnosis not present

## 2016-02-05 DIAGNOSIS — N179 Acute kidney failure, unspecified: Secondary | ICD-10-CM | POA: Diagnosis present

## 2016-02-05 DIAGNOSIS — K529 Noninfective gastroenteritis and colitis, unspecified: Secondary | ICD-10-CM | POA: Diagnosis not present

## 2016-02-05 DIAGNOSIS — R1013 Epigastric pain: Secondary | ICD-10-CM | POA: Diagnosis not present

## 2016-02-05 DIAGNOSIS — R197 Diarrhea, unspecified: Secondary | ICD-10-CM | POA: Insufficient documentation

## 2016-02-05 DIAGNOSIS — R109 Unspecified abdominal pain: Secondary | ICD-10-CM | POA: Insufficient documentation

## 2016-02-05 LAB — CBC
HEMATOCRIT: 36.3 % (ref 36.0–46.0)
Hemoglobin: 11.9 g/dL — ABNORMAL LOW (ref 12.0–15.0)
MCH: 29.2 pg (ref 26.0–34.0)
MCHC: 32.8 g/dL (ref 30.0–36.0)
MCV: 89 fL (ref 78.0–100.0)
PLATELETS: 177 10*3/uL (ref 150–400)
RBC: 4.08 MIL/uL (ref 3.87–5.11)
RDW: 14.7 % (ref 11.5–15.5)
WBC: 7.3 10*3/uL (ref 4.0–10.5)

## 2016-02-05 LAB — GASTROINTESTINAL PANEL BY PCR, STOOL (REPLACES STOOL CULTURE)
ADENOVIRUS F40/41: NOT DETECTED
ASTROVIRUS: NOT DETECTED
Campylobacter species: NOT DETECTED
Cryptosporidium: NOT DETECTED
Cyclospora cayetanensis: NOT DETECTED
E. COLI O157: NOT DETECTED
ENTEROPATHOGENIC E COLI (EPEC): NOT DETECTED
ENTEROTOXIGENIC E COLI (ETEC): NOT DETECTED
Entamoeba histolytica: NOT DETECTED
Enteroaggregative E coli (EAEC): NOT DETECTED
Giardia lamblia: NOT DETECTED
Norovirus GI/GII: NOT DETECTED
PLESIMONAS SHIGELLOIDES: NOT DETECTED
ROTAVIRUS A: NOT DETECTED
SAPOVIRUS (I, II, IV, AND V): NOT DETECTED
Salmonella species: NOT DETECTED
Shiga like toxin producing E coli (STEC): NOT DETECTED
Shigella/Enteroinvasive E coli (EIEC): NOT DETECTED
Vibrio cholerae: NOT DETECTED
Vibrio species: NOT DETECTED
Yersinia enterocolitica: NOT DETECTED

## 2016-02-05 LAB — COMPREHENSIVE METABOLIC PANEL
ALBUMIN: 2.3 g/dL — AB (ref 3.5–5.0)
ALT: 10 U/L — ABNORMAL LOW (ref 14–54)
ANION GAP: 10 (ref 5–15)
AST: 10 U/L — AB (ref 15–41)
Alkaline Phosphatase: 86 U/L (ref 38–126)
BILIRUBIN TOTAL: 0.8 mg/dL (ref 0.3–1.2)
BUN: 16 mg/dL (ref 6–20)
CHLORIDE: 103 mmol/L (ref 101–111)
CO2: 22 mmol/L (ref 22–32)
Calcium: 7.9 mg/dL — ABNORMAL LOW (ref 8.9–10.3)
Creatinine, Ser: 1.16 mg/dL — ABNORMAL HIGH (ref 0.44–1.00)
GFR calc Af Amer: 52 mL/min — ABNORMAL LOW (ref >60)
GFR, EST NON AFRICAN AMERICAN: 45 mL/min — AB (ref >60)
Glucose, Bld: 263 mg/dL — ABNORMAL HIGH (ref 65–99)
POTASSIUM: 3.2 mmol/L — AB (ref 3.5–5.1)
Sodium: 135 mmol/L (ref 135–145)
TOTAL PROTEIN: 4.3 g/dL — AB (ref 6.5–8.1)

## 2016-02-05 LAB — BASIC METABOLIC PANEL
ANION GAP: 9 (ref 5–15)
BUN: 18 mg/dL (ref 6–20)
CALCIUM: 7.9 mg/dL — AB (ref 8.9–10.3)
CHLORIDE: 100 mmol/L — AB (ref 101–111)
CO2: 20 mmol/L — ABNORMAL LOW (ref 22–32)
CREATININE: 1.37 mg/dL — AB (ref 0.44–1.00)
GFR calc non Af Amer: 37 mL/min — ABNORMAL LOW (ref >60)
GFR, EST AFRICAN AMERICAN: 43 mL/min — AB (ref >60)
Glucose, Bld: 388 mg/dL — ABNORMAL HIGH (ref 65–99)
Potassium: 3 mmol/L — ABNORMAL LOW (ref 3.5–5.1)
SODIUM: 129 mmol/L — AB (ref 135–145)

## 2016-02-05 LAB — PHOSPHORUS: PHOSPHORUS: 3.3 mg/dL (ref 2.5–4.6)

## 2016-02-05 LAB — GLUCOSE, CAPILLARY
GLUCOSE-CAPILLARY: 314 mg/dL — AB (ref 65–99)
Glucose-Capillary: 195 mg/dL — ABNORMAL HIGH (ref 65–99)
Glucose-Capillary: 246 mg/dL — ABNORMAL HIGH (ref 65–99)
Glucose-Capillary: 304 mg/dL — ABNORMAL HIGH (ref 65–99)

## 2016-02-05 LAB — TSH: TSH: 5.961 u[IU]/mL — AB (ref 0.350–4.500)

## 2016-02-05 LAB — MAGNESIUM: MAGNESIUM: 1.6 mg/dL — AB (ref 1.7–2.4)

## 2016-02-05 SURGERY — OPEN REDUCTION INTERNAL FIXATION (ORIF) ANKLE FRACTURE
Anesthesia: General | Laterality: Left

## 2016-02-05 MED ORDER — ENOXAPARIN SODIUM 40 MG/0.4ML ~~LOC~~ SOLN
40.0000 mg | SUBCUTANEOUS | Status: DC
  Administered 2016-02-05 – 2016-02-06 (×2): 40 mg via SUBCUTANEOUS
  Filled 2016-02-05 (×2): qty 0.4

## 2016-02-05 MED ORDER — MAGNESIUM SULFATE 2 GM/50ML IV SOLN
2.0000 g | Freq: Once | INTRAVENOUS | Status: AC
  Administered 2016-02-05: 2 g via INTRAVENOUS
  Filled 2016-02-05: qty 50

## 2016-02-05 MED ORDER — INSULIN DETEMIR 100 UNIT/ML ~~LOC~~ SOLN
10.0000 [IU] | Freq: Once | SUBCUTANEOUS | Status: AC
  Administered 2016-02-05: 10 [IU] via SUBCUTANEOUS
  Filled 2016-02-05: qty 0.1

## 2016-02-05 MED ORDER — POTASSIUM CHLORIDE IN NACL 40-0.9 MEQ/L-% IV SOLN
INTRAVENOUS | Status: DC
  Administered 2016-02-05 – 2016-02-07 (×3): 100 mL/h via INTRAVENOUS
  Filled 2016-02-05 (×10): qty 1000

## 2016-02-05 MED ORDER — ENSURE ENLIVE PO LIQD: 237.0000 mL | Freq: Two times a day (BID) | ORAL | Status: DC

## 2016-02-05 MED ORDER — GLUCERNA SHAKE PO LIQD
237.0000 mL | Freq: Three times a day (TID) | ORAL | Status: DC
  Administered 2016-02-05 – 2016-02-08 (×4): 237 mL via ORAL

## 2016-02-05 MED ORDER — INSULIN DETEMIR 100 UNIT/ML ~~LOC~~ SOLN
20.0000 [IU] | Freq: Every day | SUBCUTANEOUS | Status: DC
  Administered 2016-02-05 – 2016-02-07 (×3): 20 [IU] via SUBCUTANEOUS
  Filled 2016-02-05 (×4): qty 0.2

## 2016-02-05 MED ORDER — SODIUM CHLORIDE 0.9 % IV SOLN
1.0000 g | Freq: Once | INTRAVENOUS | Status: AC
  Administered 2016-02-05: 1 g via INTRAVENOUS
  Filled 2016-02-05: qty 10

## 2016-02-05 MED ORDER — SACCHAROMYCES BOULARDII 250 MG PO CAPS
250.0000 mg | ORAL_CAPSULE | Freq: Two times a day (BID) | ORAL | Status: DC
  Administered 2016-02-05 – 2016-02-12 (×13): 250 mg via ORAL
  Filled 2016-02-05 (×13): qty 1

## 2016-02-05 NOTE — Care Management Note (Signed)
Case Management Note  Patient Details  Name: Megan [name] MRN: [id number] Date of Birth: 1941-03-13  Subjective/Objective:            Patient admitted from home alone for colitis. On IV Abx.         Action/Plan:  CM will continue to follow for DC planning  Expected Discharge Date:                  Expected Discharge Plan:  [place]  In-House Referral:     Discharge planning Services  CM Consult  Post Acute Care Choice:    Choice offered to:     DME Arranged:    DME Agency:     HH Arranged:    HH Agency:     Status of Service:  In process, will continue to follow  Medicare Important Message Given:    Date Medicare IM Given:    Medicare IM give by:    Date Additional Medicare IM Given:    Additional Medicare Important Message give by:     If discussed at [place] of Stay Meetings, dates discussed:    Additional Comments:  Carles Collet, RN 02/05/2016, 3:52 PM

## 2016-02-05 NOTE — Progress Notes (Signed)
Initial Nutrition Assessment  DOCUMENTATION CODES:   Not applicable  INTERVENTION:   -D/c Ensure Enlive po BID, each supplement provides 350 kcal and 20 grams of protein -Glucerna Shake po BID, each supplement provides 220 kcal and 10 grams of protein  NUTRITION DIAGNOSIS:   Inadequate oral intake related to poor appetite as evidenced by meal completion < 50%.  GOAL:   Patient will meet greater than or equal to 90% of their needs  MONITOR:   PO intake, Supplement acceptance, Labs, Weight trends, Skin, I & O's  REASON FOR ASSESSMENT:   Malnutrition Screening Tool    ASSESSMENT:   AIMI ESSNER is a 75 y.o. female with a past medical history of diabetes on insulin, stage III lung cancer followed by oncology, history of rheumatoid arthritis, history of orthostatic hypotension who was in her usual state of health till this past Monday when she had an episode of syncope. Patient tells me that that she has orthostatic hypotension, which causes her to pass out once in a while  Pt admitted with acute colitis.   Pt reports decreased appetite over the past 5 days, due to not feeling well. Typically, she has a good appetite and consumes 6 small, high protein meals daily to assist with blood sugar control. Pt reports she did very poorly with breakfast. Noted meal completion 25%.   Pt reports UBW is 140#. She reports she has recently gained weight, due to dietary indiscretion ("I started gaining weight because I was eating things like cheeseburgers"). Noted wt gain over the past year.   Pt reports decline in mobility due to lt ankle fracture. Nutrition-Focused physical exam completed. Findings are no fat depletion, mild muscle depletion, and no edema. Suspect muscle depletion is related to decline in mobility.   Pt reports she occasionally consumes Glucerna at home when intake is poor. She refused Ensure supplement, due to concern of elevated blood sugar. She is amenable to  Glucerna.   Case discussed with RN.   Labs reviewed: K: 3.2 (on IV supplementation), Mg: 1.6, CBGS: 195-422.    Diet Order:  Diet Carb Modified Fluid consistency:: Thin; Room service appropriate?: Yes  Skin:  Reviewed, no issues  Last BM:  02/04/16  Height:   Ht Readings from Last 1 Encounters:  02/04/16 '5\' 6"'$  (1.676 m)    Weight:   Wt Readings from Last 1 Encounters:  02/04/16 155 lb 1.6 oz (70.353 kg)    Ideal Body Weight:  59.1 kg  BMI:  Body mass index is 25.05 kg/(m^2).  Estimated Nutritional Needs:   Kcal:  1550-1750  Protein:  75-90 grams  Fluid:  1.5-1.7 L  EDUCATION NEEDS:   No education needs identified at this time  Jaelyn Cloninger A. Jimmye Norman, RD, LDN, CDE Pager: [phone] After hours Pager: [phone]

## 2016-02-05 NOTE — Progress Notes (Signed)
Inpatient Diabetes Program Recommendations  AACE/ADA: New Consensus Statement on Inpatient Glycemic Control (2015)  Target Ranges:  Prepandial:   less than 140 mg/dL      Peak postprandial:   less than 180 mg/dL (1-2 hours)      Critically ill patients:  140 - 180 mg/dL   Results for Megan [name], Megan [name] (MRN [id number]) as of 02/05/2016 08:25  Ref. Range 02/05/2016 04:01  Glucose Latest Ref Range: 65-99 mg/dL 263 (H)   Review of Glycemic Control  Diabetes history: DM 2 Outpatient Diabetes medications: Levemir 34 units, Humalog 6 units with breakfast, 6 units with lunch, 10 units with supper Current orders for Inpatient glycemic control: Levemir 10 units, Novolog Sensitive + HS scale  Inpatient Diabetes Program Recommendations:   Fasting glucose 263 this am after 10 units of Levemir last night, patient takes much more basal insulin at home, Please consider increasing basal insulin to Levemir 18-20 units QHS.  Thanks,  Tama Headings RN, MSN, [facility] Inpatient Diabetes Coordinator Team Pager [phone] (8a-5p)

## 2016-02-05 NOTE — Progress Notes (Signed)
[facility] PROGRESS NOTE  Megan [name] HFW:[id number] DOB: 1941/10/30 DOA: 02/04/2016 PCP: [name], MD  Length of stay: 1   Assessment/Plan: Principal Problem:   Acute colitis Active Problems:   Malignant neoplasm of right upper lobe of lung ([place])   Neuropathy due to chemotherapeutic drug ([place])   Hypotension   Hypokalemia   Rheumatoid arthritis ([place])   Acute kidney injury ([place])   DM type 2 (diabetes mellitus, type 2) ([place])   Abdominal pain    Megan [name] is a 75 y.o. female with a past medical history of diabetes on insulin, stage III lung cancer followed by oncology, history of rheumatoid arthritis, history of orthostatic hypotension who was in her usual state of health till this past Monday when she had an episode of syncope. In the emergency department, patient was initially found to be hypotensive and tachycardic. She was noted to have acute renal failure. She was given IV fluids with which her heart rate improved and her blood pressure improved. CT scan showed findings suggestive of colitis. She'll be brought in for further management.   Assessment and plan #1 acute colitis infectious versus ischemic versus inflammatory- with the diarrhea and mild hematochezia: Positive stool guaiac. Mild drop in hemoglobin from 15.1-11.9, likely dilutional Continue  Cipro and Flagyl for now. Stool studies have been ordered. C. difficile negative.   Requested [facility] to see the patient for possible flex sigmoidoscopy/colonoscopy. Patient has never had a colonoscopy  #2 Hypotension: Secondary to hypovolemia. Improved with the aggressive IV hydration. IV fluids will be continued  #3 Acute renal failure with hypokalemia, prerenal: Most likely secondary to diarrhea and hypovolemia. Improving slowly, 1.69> 1.16..    #4 Cough: Patient was noted to have a dry cough when I was examining her. Lungs are clear. She does have a history of lung cancer and  emphysema. Chest x-ray question right middle lobe consolidation.    #5 Left ankle fracture: This was sustained on Monday when she had a syncopal episode. She was supposed to be operated on today by Dr. Sharol Given. Surgeries on 4 for now. Pain control.  #6 history of diabetes mellitus type 2 on insulin: Uncontrolled, increase Levemir  . Sliding scale insulin coverage. Monitor CBGs.  #7 history of lung cancer: Followed by oncology. Currently off treatment. She developed psoriasis as a result of her immunotreatment.  #8 history of orthostatic hypotension and episodes of syncope: Monitor on telemetry. Will need continued evaluation as outpatient if necessary. Defer to PCP.   #9 hypocalcemia- check vitamin D levels, TSH, phosphorus, PTH and replete  DVT prophylaxsis Lovenox  Code Status:      Code Status Orders        Start     Ordered   02/04/16 1944  Full code   Continuous     02/04/16 1943     Family Communication: Discussed in detail with the patient, all imaging results, lab results explained to the patient   Disposition Plan:  GI consultation, discharge decision pending resolution of multiple electrolyte abnormalities      Consultants:  Gastroenterology  Procedures:  None  Antibiotics: Anti-infectives    Start     Dose/Rate Route Frequency Ordered Stop   02/05/16 0600  ciprofloxacin (CIPRO) IVPB 400 mg     400 mg 200 mL/hr over 60 Minutes Intravenous Every 12 hours 02/04/16 1943     02/05/16 0200  metroNIDAZOLE (FLAGYL) IVPB 500 mg     500  mg 100 mL/hr over 60 Minutes Intravenous Every 8 hours 02/04/16 1943     02/04/16 1800  ciprofloxacin (CIPRO) IVPB 400 mg     400 mg 200 mL/hr over 60 Minutes Intravenous  Once 02/04/16 1748     02/04/16 1800  metroNIDAZOLE (FLAGYL) IVPB 500 mg     500 mg 100 mL/hr over 60 Minutes Intravenous  Once 02/04/16 1748 02/04/16 2100         HPI/Subjective: Patient continues to have blood per rectum, she says that she has not seen  any Diarrhea. She had 2 episodes of blood per rectum yesterday   Objective: Filed Vitals:   02/04/16 1846 02/04/16 1932 02/04/16 2216 02/05/16 0500  BP:  99/66 114/58 100/65  Pulse: 98 101 104 69  Temp:  98.2 F (36.8 C)    TempSrc:  Oral    Resp: '14 18 18 16  '$ Height:  '5\' 6"'$  (1.676 m)    Weight:  70.353 kg (155 lb 1.6 oz)    SpO2: 98% 100% 97% 98%    Intake/Output Summary (Last 24 hours) at 02/05/16 1517 Last data filed at 02/05/16 6160  Gross per 24 hour  Intake      0 ml  Output    700 ml  Net   -700 ml    Exam:  General: No acute respiratory distress Lungs: Clear to auscultation bilaterally without wheezes or crackles Cardiovascular: Regular rate and rhythm without murmur gallop or rub normal S1 and S2 Abdomen: Nontender, nondistended, soft, bowel sounds positive, no rebound, no ascites, no appreciable mass Extremities: No significant cyanosis, clubbing, or edema bilateral lower extremities     Data Review   Micro Results Recent Results (from the past 240 hour(s))  C difficile quick scan w PCR reflex     Status: None   Collection Time: 02/04/16  8:40 PM  Result Value Ref Range Status   C Diff antigen NEGATIVE NEGATIVE Final   C Diff toxin NEGATIVE NEGATIVE Final   C Diff interpretation Negative for toxigenic C. difficile  Final    Radiology Reports Ct Abdomen Pelvis Wo Contrast  02/04/2016  CLINICAL DATA:  Lower abdominal pain with gas and bloating, diarrhea for 4 days, bright red blood in stool, rectal bleeding, hypertension, rheumatoid arthritis, lung cancer post radiation therapy, diabetes mellitus, former smoker EXAM: CT ABDOMEN AND PELVIS WITHOUT CONTRAST TECHNIQUE: Multidetector CT imaging of the abdomen and pelvis was performed following the standard protocol without IV contrast. Sagittal and coronal MPR images reconstructed from axial data set. COMPARISON:  PET-CT 11/14/2013, chest CT 11/30/2015 FINDINGS: Soft tissue density adjacent to RIGHT heart border  likely reflects RIGHT middle lobe atelectasis, unchanged. Calcified granuloma LEFT lower lobe image 6 with additional tiny LEFT lower lobe nodule image 9, stable. Calcified granulomata within spleen. 9 mm cyst LEFT lobe liver image 11 unchanged. Questionable 7 mm subcapsular lesion lateral RIGHT lobe liver image 34. Lesion anterior lateral segment LEFT lobe liver 19 x 13 mm image 19 previously 14 x 9 mm. Gallbladder surgically absent. Remainder of liver, spleen, pancreas, kidneys, and adrenal glands normal. Wall thickening of ascending colon through distal transverse colon consistent with colitis. Associated significant hyperemia of transverse mesocolon. Cecum and distal colon appear unaffected. Stomach and small bowel loops normal appearance. Normal appendix Uterus surgically absent with nonvisualization of ovaries. No additional mass, adenopathy, free air free fluid. Bladder and ureters normal appearance. Scattered atherosclerotic calcifications. No acute osseous findings. IMPRESSION: Diffuse wall thickening of the ascending through distal transverse  colon with associated hyperemia of the mesocolon compatible with colitis ; differential diagnosis includes infection and inflammatory bowel disease, ischemia less likely with this distribution though not excluded. Interval increase in size of a lesion at the lateral segment LEFT lobe liver question hepatic metastasis. Electronically Signed   By: Lavonia Dana M.D.   On: 02/04/2016 16:47   Dg Ankle Complete Left  02/01/2016  CLINICAL DATA:  75 year old female fell in kitchen. Ankle pain. Initial encounter. EXAM: LEFT ANKLE COMPLETE - 3+ VIEW COMPARISON:  07/22/2014. FINDINGS: Oblique fracture of distal left fibula with slight separation of fracture fragments. Slight irregularity of the cuboid. If there were tenderness in this region, left foot films recommended for further delineation. Plantar spur. IMPRESSION: Oblique fracture of distal left fibula with slight  separation of fracture fragments. Slight irregularity of the cuboid. If there were tenderness in this region, left foot films recommended for further delineation. Electronically Signed   By: Genia Del M.D.   On: 02/01/2016 11:39   Dg Ankle Complete Right  02/01/2016  CLINICAL DATA:  [place] in kitchen this morning, patient states she was light headed when she fell, left ankle pain worse than right. Hx right ankle surgery x 2 by dr. Sullivan Lone: RIGHT ANKLE - COMPLETE 3+ VIEW COMPARISON:  08/01/2015 FINDINGS: Plate and screw fixation device within the distal right fibula. No acute fracture, subluxation or dislocation. Soft tissues are intact. IMPRESSION: No acute bony abnormality. Electronically Signed   By: Rolm Baptise M.D.   On: 02/01/2016 11:36   Dg Chest Port 1 View  02/04/2016  CLINICAL DATA:  Cough, hypotension and diarrhea. History of squamous cell carcinoma of the lung. EXAM: PORTABLE CHEST 1 VIEW COMPARISON:  CT of the chest on 11/30/2015 FINDINGS: There is chronic volume loss of the right lung with dense chronic atelectasis and consolidation of the right middle lobe. No edema, infiltrate or pneumothorax identified. No pleural effusions are seen. The heart size is normal. IMPRESSION: Chronic right middle lobe atelectasis and consolidation. Electronically Signed   By: Aletta Edouard M.D.   On: 02/04/2016 22:02   Dg Foot Complete Left  02/01/2016  CLINICAL DATA:  75 year old female with ankle fracture. Foot pain. Post fall. Subsequent encounter. EXAM: LEFT FOOT - COMPLETE 3+ VIEW COMPARISON:  02/01/2016 ankle films. FINDINGS: Fracture of the distal left fibula as noted on ankle films. No other fracture or dislocation noted. Plantar spur. IMPRESSION: Fracture of the distal left fibula as noted on ankle films. No other fracture or dislocation noted. Electronically Signed   By: Genia Del M.D.   On: 02/01/2016 13:12     CBC  Recent Labs Lab 02/01/16 1139 02/04/16 1525 02/04/16 1534  02/04/16 2100 02/05/16 0401  WBC 6.6 10.9*  --  8.5 7.3  HGB 14.2 15.1* 17.3* 12.8 11.9*  HCT 41.5 46.0 51.0* 39.0 36.3  PLT 172 252  --  216 177  MCV 86.8 89.3  --  89.4 89.0  MCH 29.7 29.3  --  29.4 29.2  MCHC 34.2 32.8  --  32.8 32.8  RDW 14.4 14.8  --  14.6 14.7  LYMPHSABS 0.4*  --   --   --   --   MONOABS 0.5  --   --   --   --   EOSABS 0.2  --   --   --   --   BASOSABS 0.0  --   --   --   --     Chemistries  Recent Labs Lab 02/01/16 1139 02/04/16 1525 02/04/16 1534 02/04/16 2334 02/05/16 0401  NA 135 134* 133* 129* 135  K 4.0 3.0* 2.9* 3.0* 3.2*  CL 96* 95* 93* 100* 103  CO2 28 24  --  20* 22  GLUCOSE 347* 312* 302* 388* 263*  BUN 18 18 21* 18 16  CREATININE 0.87 1.69* 1.40* 1.37* 1.16*  CALCIUM 9.0 9.1  --  7.9* 7.9*  AST  --  17  --   --  10*  ALT  --  13*  --   --  10*  ALKPHOS  --  119  --   --  86  BILITOT  --  1.1  --   --  0.8   ------------------------------------------------------------------------------------------------------------------ estimated creatinine clearance is 39.8 mL/min (by C-G formula based on Cr of 1.16). ------------------------------------------------------------------------------------------------------------------ No results for input(s): HGBA1C in the last 72 hours. ------------------------------------------------------------------------------------------------------------------ No results for input(s): CHOL, HDL, LDLCALC, TRIG, CHOLHDL, LDLDIRECT in the last 72 hours. ------------------------------------------------------------------------------------------------------------------ No results for input(s): TSH, T4TOTAL, T3FREE, THYROIDAB in the last 72 hours.  Invalid input(s): FREET3 ------------------------------------------------------------------------------------------------------------------ No results for input(s): VITAMINB12, FOLATE, FERRITIN, TIBC, IRON, RETICCTPCT in the last 72 hours.  Coagulation profile  Recent  Labs Lab 02/04/16 1525  INR 0.91    No results for input(s): DDIMER in the last 72 hours.  Cardiac Enzymes No results for input(s): CKMB, TROPONINI, MYOGLOBIN in the last 168 hours.  Invalid input(s): CK ------------------------------------------------------------------------------------------------------------------ Invalid input(s): POCBNP   CBG:  Recent Labs Lab 02/04/16 1404 02/04/16 2214  GLUCAP 264* 422*       Studies: Ct Abdomen Pelvis Wo Contrast  02/04/2016  CLINICAL DATA:  Lower abdominal pain with gas and bloating, diarrhea for 4 days, bright red blood in stool, rectal bleeding, hypertension, rheumatoid arthritis, lung cancer post radiation therapy, diabetes mellitus, former smoker EXAM: CT ABDOMEN AND PELVIS WITHOUT CONTRAST TECHNIQUE: Multidetector CT imaging of the abdomen and pelvis was performed following the standard protocol without IV contrast. Sagittal and coronal MPR images reconstructed from axial data set. COMPARISON:  PET-CT 11/14/2013, chest CT 11/30/2015 FINDINGS: Soft tissue density adjacent to RIGHT heart border likely reflects RIGHT middle lobe atelectasis, unchanged. Calcified granuloma LEFT lower lobe image 6 with additional tiny LEFT lower lobe nodule image 9, stable. Calcified granulomata within spleen. 9 mm cyst LEFT lobe liver image 11 unchanged. Questionable 7 mm subcapsular lesion lateral RIGHT lobe liver image 34. Lesion anterior lateral segment LEFT lobe liver 19 x 13 mm image 19 previously 14 x 9 mm. Gallbladder surgically absent. Remainder of liver, spleen, pancreas, kidneys, and adrenal glands normal. Wall thickening of ascending colon through distal transverse colon consistent with colitis. Associated significant hyperemia of transverse mesocolon. Cecum and distal colon appear unaffected. Stomach and small bowel loops normal appearance. Normal appendix Uterus surgically absent with nonvisualization of ovaries. No additional mass, adenopathy,  free air free fluid. Bladder and ureters normal appearance. Scattered atherosclerotic calcifications. No acute osseous findings. IMPRESSION: Diffuse wall thickening of the ascending through distal transverse colon with associated hyperemia of the mesocolon compatible with colitis ; differential diagnosis includes infection and inflammatory bowel disease, ischemia less likely with this distribution though not excluded. Interval increase in size of a lesion at the lateral segment LEFT lobe liver question hepatic metastasis. Electronically Signed   By: Lavonia Dana M.D.   On: 02/04/2016 16:47   Dg Chest Port 1 View  02/04/2016  CLINICAL DATA:  Cough, hypotension and diarrhea. History of squamous cell carcinoma of the lung.  EXAM: PORTABLE CHEST 1 VIEW COMPARISON:  CT of the chest on 11/30/2015 FINDINGS: There is chronic volume loss of the right lung with dense chronic atelectasis and consolidation of the right middle lobe. No edema, infiltrate or pneumothorax identified. No pleural effusions are seen. The heart size is normal. IMPRESSION: Chronic right middle lobe atelectasis and consolidation. Electronically Signed   By: Aletta Edouard M.D.   On: 02/04/2016 22:02      No results found for: HGBA1C Lab Results  Component Value Date   CREATININE 1.16* 02/05/2016       Scheduled Meds: . ciprofloxacin  400 mg Intravenous Once  . ciprofloxacin  400 mg Intravenous Q12H  . darifenacin  7.5 mg Oral Daily  . feeding supplement (ENSURE ENLIVE)  237 mL Oral BID BM  . gabapentin  300 mg Oral BID  . insulin aspart  0-5 Units Subcutaneous QHS  . insulin aspart  0-9 Units Subcutaneous TID WC  . insulin detemir  10 Units Subcutaneous QHS  . metronidazole  500 mg Intravenous Q8H  . mometasone-formoterol  2 puff Inhalation BID  . sodium chloride flush  3 mL Intravenous Q12H   Continuous Infusions: . sodium chloride 100 mL/hr at 02/04/16 2132    Principal Problem:   Acute colitis Active Problems:    Malignant neoplasm of right upper lobe of lung ([facility])   Neuropathy due to chemotherapeutic drug ([place])   Hypotension   Hypokalemia   Rheumatoid arthritis ([place])   Acute kidney injury ([place])   DM type 2 (diabetes mellitus, type 2) ([place])   Abdominal pain    Time spent: 45 minutes   [facility] Pager [phone]. If 7PM-7AM, please contact night-coverage at [email or web address], password [facility] 02/05/2016, 8:38 AM  LOS: 1 day

## 2016-02-05 NOTE — Consult Note (Signed)
Consultation  Referring Provider:  [facility] hospitalist/[facility]rol  Primary Care Physician:  [name], MD Primary Gastroenterologist:  Former - Dr Deatra Ina  Reason for Consultation:   Diarrhea, rectal bleeding/ colitis on CT  HPI: Megan [name] is a 75 y.o. female  with history of insulin-dependent diabetes mellitus, hypertension, rheumatoid arthritis, is post cholecystectomy and hysterectomy. She has history of squamous cell lung cancer stage III B which she is followed by Dr. Earlie Server. She has undergone chemoradiation and most recently was treated with Nivolumab which was stopped in February 2017 plans for 3 months of observation. He did have some problems with mild diarrhea which was felt immune related and had been treated with a course of prednisone per oncology with improvement in symptoms. She was admitted through the emergency room yesterday hypotensive tachycardic and with complaints of diarrhea over the prior 3 days. So had a syncopal episode at home earlier this week but says she has history of orthostatic hypotension She's not been on any recent antibiotics and no new medications. CT of the abdomen and pelvis done yesterday without IV contrast shows right middle lobe atelectasis and wall thickening of the ascending colon to distal transverse colon consistent with a colitis infectious/inflammatory/question ischemic. Also mentioned increase in size of a left lobe liver lesion question metastatic lesion. C diff  quick scan was negative, GI pathogen panel pending she is being covered with Cipro and Flagyl. Patient hemodynamically stable today. Patient states that she has had periodic bouts of diarrhea for several years which may last for for 5 days and then she goes back to having normal bowel movements. She states that she got good relief from the course of steroids she was given for  diarrhea while she was on Nivolumab. She has had 2 courses of antibiotics over the past couple  of months for urinary tract infections the last about one month ago. She says she developed abrupt onset of diarrhea on Monday after eating at a [employer] on Sunday evening. She had at least 8-10 watery nonbloody bowel movements on Monday with no associated abdominal pain or cramping no fever but did have some dry heaves. Over the next couple of days she saw some bright red blood mixed with her bowel movements. Yesterday no blood noted and has not seen any blood today though she says her stool is still watery they're not as frequent. She has not had prior colonoscopy.   Past Medical History  Diagnosis Date  . Hypertension   . Rheumatoid arthritis ([place])   . GERD (gastroesophageal reflux disease)     no meds for  . Hx of radiation therapy 12/16/13-01/30/14    lung 66Gy  . Neuropathy ([place])   . Shortness of breath dyspnea     with exertion  . History of hiatal hernia   . Diabetes mellitus     insulin  . Cancer ([place]) dx'd 09/2013    Lung ca  . Malignant neoplasm of right upper lobe of lung ([place]) 11/28/2013  . Psoriasis     Past Surgical History  Procedure Laterality Date  . Cholecystectomy    . Abdominal hysterectomy    . Dilation and curettage of uterus    . Abdominal surgery    . Tonsillectomy    . Esophagogastroduodenoscopy (egd) with propofol N/A 10/09/2014    Procedure: ESOPHAGOGASTRODUODENOSCOPY (EGD) WITH PROPOFOL;  Surgeon: Inda Castle, MD;  Location: [facility];  Service: Endoscopy;  Laterality: N/A;  . Savory dilation N/A 10/09/2014  Procedure: SAVORY DILATION;  Surgeon: Inda Castle, MD;  Location: [facility];  Service: Endoscopy;  Laterality: N/A;  . Esophagogastroduodenoscopy N/A 12/12/2014    Procedure: ESOPHAGOGASTRODUODENOSCOPY (EGD);  Surgeon: Inda Castle, MD;  Location: [facility];  Service: Endoscopy;  Laterality: N/A;  with dilation  . Balloon dilation N/A 12/12/2014    Procedure: BALLOON DILATION;  Surgeon: Inda Castle, MD;  Location:  [facility];  Service: Endoscopy;  Laterality: N/A;  . Orif ankle fracture Right 07/04/2015    Procedure: OPEN REDUCTION INTERNAL FIXATION (ORIF) ANKLE FRACTURE;  Surgeon: Newt Minion, MD;  Location: [facility];  Service: Orthopedics;  Laterality: Right;  OPEN REDUCTION, INTERNAL FIXATION OF RIGHT ANKLE FRACTURE.   . Fracture surgery      Prior to Admission medications   Medication Sig Start Date End Date Taking? Authorizing Provider  fluticasone (CUTIVATE) 0.05 % cream Apply 1 application topically 2 (two) times daily as needed (rash).    Yes Historical Provider, MD  Fluticasone-Salmeterol (ADVAIR DISKUS) 100-50 MCG/DOSE AEPB Inhale 1 puff into the lungs 2 (two) times daily. Patient taking differently: Inhale 1 puff into the lungs 2 (two) times daily as needed (sob and wheezing).  08/27/14  Yes Brand Males, MD  Gabapentin Enacarbil (HORIZANT) 600 MG TBCR Take 1,200 mg by mouth daily. As directed Patient taking differently: Take 600 mg by mouth 2 (two) times daily. As directed 12/14/15  Yes Melvenia Beam, MD  HUMALOG KWIKPEN 100 UNIT/ML KiwkPen Inject 6-10 Units into the skin 3 (three) times daily. Takes 6 units in the morning and at lunchtime, then takes 10 units in the evening 10/07/15  Yes Historical Provider, MD  HYDROcodone-acetaminophen (NORCO/VICODIN) 5-325 MG tablet Take 1 tablet by mouth every 6 (six) hours as needed for moderate pain. 02/01/16  Yes Leo Grosser, MD  insulin detemir (LEVEMIR) 100 UNIT/ML injection Inject 34 Units into the skin at bedtime.    Yes Historical Provider, MD  ketoconazole (NIZORAL) 2 % cream Apply 1 application topically 2 (two) times daily as needed for irritation.  01/04/16  Yes Historical Provider, MD  losartan-hydrochlorothiazide (HYZAAR) 100-25 MG tablet Take 1 tablet by mouth daily. 10/13/15  Yes Historical Provider, MD  potassium chloride SA (K-DUR,KLOR-CON) 20 MEQ tablet Take 40 mEq by mouth daily.   Yes Historical Provider, MD  VESICARE 5 MG tablet Take 5  mg by mouth daily. 01/09/16  Yes Historical Provider, MD    Current Facility-Administered Medications  Medication Dose Route Frequency Provider Last Rate Last Dose  . 0.9 % NaCl with KCl 40 mEq / L  infusion   Intravenous Continuous Reyne Dumas, MD 100 mL/hr at 02/05/16 1059 100 mL/hr at 02/05/16 1059  . acetaminophen (TYLENOL) tablet 650 mg  650 mg Oral Q6H PRN Bonnielee Haff, MD       Or  . acetaminophen (TYLENOL) suppository 650 mg  650 mg Rectal Q6H PRN Bonnielee Haff, MD      . calcium gluconate 1 g in sodium chloride 0.9 % 100 mL IVPB  1 g Intravenous Once Reyne Dumas, MD      . ciprofloxacin (CIPRO) IVPB 400 mg  400 mg Intravenous Once Sherwood Gambler, MD   400 mg at 02/04/16 1824  . ciprofloxacin (CIPRO) IVPB 400 mg  400 mg Intravenous Q12H Bonnielee Haff, MD   400 mg at 02/05/16 0528  . darifenacin (ENABLEX) 24 hr tablet 7.5 mg  7.5 mg Oral Daily Bonnielee Haff, MD   7.5 mg at 02/05/16 1109  .  enoxaparin (LOVENOX) injection 40 mg  40 mg Subcutaneous Q24H Reyne Dumas, MD   40 mg at 02/05/16 1109  . feeding supplement (ENSURE ENLIVE) (ENSURE ENLIVE) liquid 237 mL  237 mL Oral BID BM Bonnielee Haff, MD   237 mL at 02/05/16 1000  . gabapentin (NEURONTIN) capsule 300 mg  300 mg Oral BID Bonnielee Haff, MD   300 mg at 02/05/16 1109  . HYDROcodone-acetaminophen (NORCO/VICODIN) 5-325 MG per tablet 1 tablet  1 tablet Oral Q6H PRN Bonnielee Haff, MD      . insulin aspart (novoLOG) injection 0-5 Units  0-5 Units Subcutaneous QHS Bonnielee Haff, MD   5 Units at 02/04/16 2242  . insulin aspart (novoLOG) injection 0-9 Units  0-9 Units Subcutaneous TID WC Bonnielee Haff, MD   2 Units at 02/05/16 0800  . insulin detemir (LEVEMIR) injection 20 Units  20 Units Subcutaneous QHS Reyne Dumas, MD      . ketoconazole (NIZORAL) 2 % cream 1 application  1 application Topical BID PRN Bonnielee Haff, MD      . magnesium sulfate IVPB 2 g 50 mL  2 g Intravenous Once Reyne Dumas, MD      . metroNIDAZOLE (FLAGYL)  IVPB 500 mg  500 mg Intravenous Q8H Bonnielee Haff, MD   500 mg at 02/05/16 1100  . mometasone-formoterol (DULERA) 100-5 MCG/ACT inhaler 2 puff  2 puff Inhalation BID Bonnielee Haff, MD   2 puff at 02/04/16 2230  . ondansetron (ZOFRAN) tablet 4 mg  4 mg Oral Q6H PRN Bonnielee Haff, MD       Or  . ondansetron (ZOFRAN) injection 4 mg  4 mg Intravenous Q6H PRN Bonnielee Haff, MD      . sodium chloride flush (NS) 0.9 % injection 3 mL  3 mL Intravenous Q12H Bonnielee Haff, MD       Facility-Administered Medications Ordered in Other Encounters  Medication Dose Route Frequency Provider Last Rate Last Dose  . sodium chloride 0.9 % injection 10 mL  10 mL Intracatheter PRN Curt Bears, MD   10 mL at 12/30/13 1121    Allergies as of 02/04/2016 - Review Complete 02/04/2016  Allergen Reaction Noted  . Carboplatin Other (See Comments) 05/29/2014  . Remicade [infliximab] Anaphylaxis 05/13/2012  . Hydromorphone Rash 08/11/2014    Family History  Problem Relation Age of Onset  . Hypertension Other   . Stroke Other   . Heart attack Other   . Hemachromatosis Other   . Rheum arthritis      both sets of grandparents and father  . Other Mother   . Other Father     failure to thrive    Social History   Social History  . Marital Status: Divorced    Spouse Name: N/A  . Number of Children: 0  . Years of Education: MA   Occupational History  . Retired Other   Social History Main Topics  . Smoking status: Former Smoker -- 3.00 packs/day for 45 years    Types: Cigarettes    Quit date: 12/05/1997  . Smokeless tobacco: Never Used  . Alcohol Use: No  . Drug Use: No  . Sexual Activity: Not Currently   Other Topics Concern  . Not on file   Social History Narrative   Patient lives at home alone.   Caffeine Use: 2 cups daily    Review of Systems: Pertinent positive and negative review of systems were noted in the above HPI section.  All other review of systems was  otherwise  negative.[name]  Physical Exam: Vital signs in last 24 hours: Temp:  [97.6 F (36.4 C)-98.2 F (36.8 C)] 98.2 F (36.8 C) (04/06 1932) Pulse Rate:  [69-120] 69 (04/07 0500) Resp:  [10-21] 16 (04/07 0500) BP: (85-114)/(46-66) 100/65 mmHg (04/07 0500) SpO2:  [96 %-100 %] 98 % (04/07 0500) Weight:  [141 lb (63.957 kg)-155 lb 1.6 oz (70.353 kg)] 155 lb 1.6 oz (70.353 kg) (04/06 1932) Last BM Date: 02/04/16 General:   Alert,  Well-developed, elderly WF,well-nourished, pleasant and cooperative in NAD Head:  Normocephalic and atraumatic. Eyes:  Sclera clear, no icterus.   Conjunctiva pink. Ears:  Normal auditory acuity. Nose:  No deformity, discharge,  or lesions. Mouth:  No deformity or lesions.   Neck:  Supple; no masses or thyromegaly. Lungs:  Clear throughout to auscultation.   No wheezes, crackles, or rhonchi. Heart:  Regular rate and rhythm; no murmurs, clicks, rubs,  or gallops. Abdomen:  Soft,nontender, BS active,nonpalp mass or hsm.   Rectal:  Deferred  . Pulses:  Normal pulses noted. Extremities:  Without clubbing or edema.- she has a boot on  left lower  leg Neurologic:  Alert and  oriented x4;  grossly normal neurologically. Skin:  Intact without significant lesions or rashes.. Psych:  Alert and cooperative. Normal mood and affect.  Intake/Output from previous day:   Intake/Output this shift: Total I/O In: 223 [P.O.:220; I.V.:3] Out: 1000 [Urine:1000]  Lab Results:  Recent Labs  02/04/16 1525 02/04/16 1534 02/04/16 2100 02/05/16 0401  WBC 10.9*  --  8.5 7.3  HGB 15.1* 17.3* 12.8 11.9*  HCT 46.0 51.0* 39.0 36.3  PLT 252  --  216 177   BMET  Recent Labs  02/04/16 1525 02/04/16 1534 02/04/16 2334 02/05/16 0401  NA 134* 133* 129* 135  K 3.0* 2.9* 3.0* 3.2*  CL 95* 93* 100* 103  CO2 24  --  20* 22  GLUCOSE 312* 302* 388* 263*  BUN 18 21* 18 16  CREATININE 1.69* 1.40* 1.37* 1.16*  CALCIUM 9.1  --  7.9* 7.9*   LFT  Recent Labs  02/05/16 0401  PROT  4.3*  ALBUMIN 2.3*  AST 10*  ALT 10*  ALKPHOS 86  BILITOT 0.8   PT/INR  Recent Labs  02/04/16 1525  LABPROT 12.5  INR 0.91    IMPRESSION:   #1  75 yo white female admitted with hypotension, tachycardia and acute diarrheal illness 5 days. She has a right-sided colitis on CT. At this time would treat as infectious, and agree with coverage with Cipro and Flagyl for now. Check stool for C. difficile by PCR. Unlikely to be ischemic with no associated pain. Add Florastor  twice daily She will need colonoscopy at some point, however if he improves during hospitalization for treatment of infectious colitis would wait and do colonoscopy as an outpatient after she is over the acute illness  #2 stage IIIB squamous cell lung cancer-currently not on any treatment over the past couple of months and CT shows enlargement of a small left lobe liver lesion possibly metastatic disease-she needs to see Dr. Earlie Server very soon  #3 history of reflux esophagitis and radiation-induced esophagitis #4 insulin-dependent diabetes mellitus #5 rheumatoid arthritis #6 left tibial fracture secondary to fall associated with her above acute illness #7 anemia-multifactorial and suspect most of drop since admission has been dilutional with hydration      Amy Esterwood  02/05/2016, 12:06 PM   I have reviewed the entire case in detail with the  above APP and discussed the plan in detail.  Therefore, I agree with the diagnoses recorded above. In addition,  I have personally interviewed and examined the patient and have personally reviewed any abdominal/pelvic CT scan images.  My additional thoughts are as follows:  While this appears to be acute diarrhea rather than chronic, her right colon does look abnl to me on CT scan, and there was some bleeding. I would like her to have a colonoscopy that day after tomorrow. We will start a clear liquid diet in the AM, and my PA will be by tomorrow to make preparation  orders.    Nelida Meuse III Pager [phone]  Mon-Fri 8a-5p [phone] after 5p, weekends, holidays

## 2016-02-05 NOTE — Care Management Obs Status (Signed)
[place] NOTIFICATION   Patient Details  Name: RYA RAUSCH MRN: [id number] Date of Birth: [birth year]   Medicare Observation Status Notification Given:  Yes    Carles Collet, RN 02/05/2016, 4:17 PM

## 2016-02-06 DIAGNOSIS — R1013 Epigastric pain: Secondary | ICD-10-CM | POA: Diagnosis not present

## 2016-02-06 DIAGNOSIS — K529 Noninfective gastroenteritis and colitis, unspecified: Secondary | ICD-10-CM

## 2016-02-06 DIAGNOSIS — R197 Diarrhea, unspecified: Secondary | ICD-10-CM | POA: Diagnosis not present

## 2016-02-06 DIAGNOSIS — N179 Acute kidney failure, unspecified: Secondary | ICD-10-CM | POA: Diagnosis not present

## 2016-02-06 DIAGNOSIS — K921 Melena: Secondary | ICD-10-CM | POA: Diagnosis not present

## 2016-02-06 LAB — CBC
HEMATOCRIT: 33.6 % — AB (ref 36.0–46.0)
Hemoglobin: 10.9 g/dL — ABNORMAL LOW (ref 12.0–15.0)
MCH: 29.3 pg (ref 26.0–34.0)
MCHC: 32.4 g/dL (ref 30.0–36.0)
MCV: 90.3 fL (ref 78.0–100.0)
PLATELETS: 188 10*3/uL (ref 150–400)
RBC: 3.72 MIL/uL — AB (ref 3.87–5.11)
RDW: 14.8 % (ref 11.5–15.5)
WBC: 6.2 10*3/uL (ref 4.0–10.5)

## 2016-02-06 LAB — COMPREHENSIVE METABOLIC PANEL
ALT: 7 U/L — AB (ref 14–54)
AST: 8 U/L — AB (ref 15–41)
Albumin: 2 g/dL — ABNORMAL LOW (ref 3.5–5.0)
Alkaline Phosphatase: 78 U/L (ref 38–126)
Anion gap: 7 (ref 5–15)
BUN: 6 mg/dL (ref 6–20)
CHLORIDE: 107 mmol/L (ref 101–111)
CO2: 27 mmol/L (ref 22–32)
CREATININE: 0.82 mg/dL (ref 0.44–1.00)
Calcium: 7.9 mg/dL — ABNORMAL LOW (ref 8.9–10.3)
GFR calc Af Amer: >60 mL/min (ref >60)
GLUCOSE: 132 mg/dL — AB (ref 65–99)
Potassium: 3.7 mmol/L (ref 3.5–5.1)
Sodium: 141 mmol/L (ref 135–145)
Total Bilirubin: 0.6 mg/dL (ref 0.3–1.2)
Total Protein: 4.1 g/dL — ABNORMAL LOW (ref 6.5–8.1)

## 2016-02-06 LAB — GLUCOSE, CAPILLARY
GLUCOSE-CAPILLARY: 127 mg/dL — AB (ref 65–99)
Glucose-Capillary: 83 mg/dL (ref 65–99)
Glucose-Capillary: 128 mg/dL — ABNORMAL HIGH (ref 65–99)
Glucose-Capillary: 359 mg/dL — ABNORMAL HIGH (ref 65–99)

## 2016-02-06 LAB — VITAMIN D 25 HYDROXY (VIT D DEFICIENCY, FRACTURES): VIT D 25 HYDROXY: 8.7 ng/mL — AB (ref 30.0–100.0)

## 2016-02-06 LAB — CALCIUM, IONIZED: CALCIUM, IONIZED, SERUM: 4.8 mg/dL (ref 4.5–5.6)

## 2016-02-06 LAB — PARATHYROID HORMONE, INTACT (NO CA): PTH: 33 pg/mL (ref 15–65)

## 2016-02-06 MED ORDER — POTASSIUM CHLORIDE CRYS ER 20 MEQ PO TBCR
40.0000 meq | EXTENDED_RELEASE_TABLET | Freq: Once | ORAL | Status: AC
  Administered 2016-02-06: 40 meq via ORAL
  Filled 2016-02-06: qty 2

## 2016-02-06 MED ORDER — PEG-KCL-NACL-NASULF-NA ASC-C 100 G PO SOLR
1.0000 | Freq: Once | ORAL | Status: AC
  Administered 2016-02-06: 200 g via ORAL
  Filled 2016-02-06: qty 1

## 2016-02-06 MED ORDER — SODIUM CHLORIDE 0.9 % IV SOLN
2.0000 g | Freq: Once | INTRAVENOUS | Status: AC
  Administered 2016-02-06: 2 g via INTRAVENOUS
  Filled 2016-02-06: qty 20

## 2016-02-06 MED ORDER — VITAMIN D (ERGOCALCIFEROL) 1.25 MG (50000 UNIT) PO CAPS
50000.0000 [IU] | ORAL_CAPSULE | ORAL | Status: DC
Start: 2016-02-06 — End: 2016-02-12
  Administered 2016-02-06: 50000 [IU] via ORAL
  Filled 2016-02-06: qty 1

## 2016-02-06 NOTE — Progress Notes (Signed)
Patient ID: Megan [name], female   DOB: [birth year], 75 y.o.   MRN: [id number]    Progress Note   Subjective    Feeling better, no abdominal pain or cramping, no obvious blood-still having diarrhea but less  GI path panel neg Cdiff quick screen Neg   Objective   Vital signs in last 24 hours: Temp:  [97.5 F (36.4 C)-98 F (36.7 C)] 98 F (36.7 C) (04/07 2132) Pulse Rate:  [89-117] 89 (04/08 0533) Resp:  [16-18] 16 (04/08 0533) BP: (100-121)/(41-90) 100/45 mmHg (04/08 0533) SpO2:  [94 %-98 %] 98 % (04/08 0926) Last BM Date: 02/06/16 General: older    white female in NAD Heart:  Regular rate and rhythm; no murmurs Lungs: Respirations even and unlabored, lungs CTA bilaterally Abdomen:  Soft, nontender and nondistended. Normal bowel sounds. Extremities:  Without edem boot on left LE. Neurologic:  Alert and oriented,  grossly normal neurologically. Psych:  Cooperative. Normal mood and affect.  Intake/Output from previous day: 04/07 0701 - 04/08 0700 In: 343 [P.O.:340; I.V.:3] Out: 1000 [Urine:1000] Intake/Output this shift:    Lab Results:  Recent Labs  02/04/16 2100 02/05/16 0401 02/06/16 0735  WBC 8.5 7.3 6.2  HGB 12.8 11.9* 10.9*  HCT 39.0 36.3 33.6*  PLT 216 177 188   BMET  Recent Labs  02/04/16 2334 02/05/16 0401 02/06/16 0735  NA 129* 135 141  K 3.0* 3.2* 3.7  CL 100* 103 107  CO2 20* 22 27  GLUCOSE 388* 263* 132*  BUN '18 16 6  ' CREATININE 1.37* 1.16* 0.82  CALCIUM 7.9* 7.9* 7.9*   LFT  Recent Labs  02/06/16 0735  PROT 4.1*  ALBUMIN 2.0*  AST 8*  ALT 7*  ALKPHOS 78  BILITOT 0.6   PT/INR  Recent Labs  02/04/16 1525  LABPROT 12.5  INR 0.91    Studies/Results: Ct Abdomen Pelvis Wo Contrast  02/04/2016  CLINICAL DATA:  Lower abdominal pain with gas and bloating, diarrhea for 4 days, bright red blood in stool, rectal bleeding, hypertension, rheumatoid arthritis, lung cancer post radiation therapy, diabetes mellitus, former smoker  EXAM: CT ABDOMEN AND PELVIS WITHOUT CONTRAST TECHNIQUE: Multidetector CT imaging of the abdomen and pelvis was performed following the standard protocol without IV contrast. Sagittal and coronal MPR images reconstructed from axial data set. COMPARISON:  PET-CT 11/14/2013, chest CT 11/30/2015 FINDINGS: Soft tissue density adjacent to RIGHT heart border likely reflects RIGHT middle lobe atelectasis, unchanged. Calcified granuloma LEFT lower lobe image 6 with additional tiny LEFT lower lobe nodule image 9, stable. Calcified granulomata within spleen. 9 mm cyst LEFT lobe liver image 11 unchanged. Questionable 7 mm subcapsular lesion lateral RIGHT lobe liver image 34. Lesion anterior lateral segment LEFT lobe liver 19 x 13 mm image 19 previously 14 x 9 mm. Gallbladder surgically absent. Remainder of liver, spleen, pancreas, kidneys, and adrenal glands normal. Wall thickening of ascending colon through distal transverse colon consistent with colitis. Associated significant hyperemia of transverse mesocolon. Cecum and distal colon appear unaffected. Stomach and small bowel loops normal appearance. Normal appendix Uterus surgically absent with nonvisualization of ovaries. No additional mass, adenopathy, free air free fluid. Bladder and ureters normal appearance. Scattered atherosclerotic calcifications. No acute osseous findings. IMPRESSION: Diffuse wall thickening of the ascending through distal transverse colon with associated hyperemia of the mesocolon compatible with colitis ; differential diagnosis includes infection and inflammatory bowel disease, ischemia less likely with this distribution though not excluded. Interval increase in size of a lesion at the lateral  segment LEFT lobe liver question hepatic metastasis. Electronically Signed   By: Lavonia Dana M.D.   On: 02/04/2016 16:47   Dg Chest Port 1 View  02/04/2016  CLINICAL DATA:  Cough, hypotension and diarrhea. History of squamous cell carcinoma of the lung.  EXAM: PORTABLE CHEST 1 VIEW COMPARISON:  CT of the chest on 11/30/2015 FINDINGS: There is chronic volume loss of the right lung with dense chronic atelectasis and consolidation of the right middle lobe. No edema, infiltrate or pneumothorax identified. No pleural effusions are seen. The heart size is normal. IMPRESSION: Chronic right middle lobe atelectasis and consolidation. Electronically Signed   By: Aletta Edouard M.D.   On: 02/04/2016 22:02       Assessment / Plan:    #1 75 yo female with acute diarrheal illness with right sided colitis on CT Infectious work up negative- she is being covered with Cipro and flagyl  Plan is for colonoscopy in am tomorrow Clear liquids today, prep this evening  have stopped lovenox for today  #2 Sqamous cell lung CA - no active treatment currently  However CT shows new possible liver met- needs to see Dr Julien Nordmann very soon after D/C #3 anemia  #4 RA #5 AKI - improved #6 hypokalemia- resolved #7 AODM  Principal Problem:   Acute colitis Active Problems:   Malignant neoplasm of right upper lobe of lung ([place])   Neuropathy due to chemotherapeutic drug ([place])   Hypotension   Hypokalemia   Rheumatoid arthritis ([place])   Acute kidney injury ([place])   DM type 2 (diabetes mellitus, type 2) ([place])   Abdominal pain   AKI (acute kidney injury) ([place])   Abnormal finding on GI tract imaging   Diarrhea   Hematochezia     LOS: 2 days   Amy Esterwood  02/06/2016, 12:51 PM  I have discussed the case with the PA, and that is the plan I formulated.  CC: diarrhea  We will proceed with colonoscopy tomorrow.

## 2016-02-06 NOTE — Progress Notes (Addendum)
[facility] PROGRESS NOTE  Megan [name] VVZ:[id number] DOB: Mar 17, 1941 DOA: 02/04/2016 PCP: [name], MD  Length of stay: 2   Assessment/Plan: Principal Problem:   Acute colitis Active Problems:   Malignant neoplasm of right upper lobe of lung ([place])   Neuropathy due to chemotherapeutic drug ([place])   Hypotension   Hypokalemia   Rheumatoid arthritis ([place])   Acute kidney injury ([place])   DM type 2 (diabetes mellitus, type 2) (HCC)   Abdominal pain   AKI (acute kidney injury) ([place])   Abnormal finding on GI tract imaging   Diarrhea   Hematochezia    Megan [name] is a 75 y.o. female with a past medical history of diabetes on insulin, stage III lung cancer followed by oncology, history of rheumatoid arthritis, history of orthostatic hypotension who was in her usual state of health till this past Monday when she had an episode of syncope. In the emergency department, patient was initially found to be hypotensive and tachycardic. She was noted to have acute renal failure. She was given IV fluids with which her heart rate improved and her blood pressure improved. CT scan showed findings suggestive of colitis. She'll be brought in for further management.   Assessment and plan #1 acute colitis infectious versus ischemic versus inflammatory- with the diarrhea and mild hematochezia: Positive stool guaiac. Mild drop in hemoglobin from 15.1-11.9, likely dilutional Continue  Cipro and Flagyl for now. Stool studies have been ordered. C. difficile negative.   Requested [facility] to see the patient for possible flex sigmoidoscopy/colonoscopy. Patient has never had a colonoscopy, plan to do colo in am per GI   #2 Hypotension: Secondary to hypovolemia. Improved with the aggressive IV hydration. IV fluids will be continued  #3 Acute renal failure with hypokalemia, prerenal: Most likely secondary to diarrhea and hypovolemia. Improving slowly, 1.69> 1.16..    #4 Cough:  Patient was noted to have a dry cough when I was examining her. Lungs are clear. She does have a history of lung cancer and emphysema. Chest x-ray question right middle lobe consolidation.    #5 Left ankle fracture: This was sustained on Monday when she had a syncopal episode. She was supposed to be operated on today by Dr. Sharol Given. Surgeries on 4 for now. Pain control.  #6 history of diabetes mellitus type 2 on insulin: Uncontrolled, increase Levemir  . Sliding scale insulin coverage. Monitor CBGs.  #7 history of lung cancer: Followed by oncology. Currently off treatment. She developed psoriasis as a result of her immunotreatment.  #8 history of orthostatic hypotension and episodes of syncope: Monitor on telemetry. Will need continued evaluation as outpatient if necessary. Defer to PCP.   #9 hypocalcemia-  vitamin D levels 8.7 , TSH 5.9 , phosphorus, PTH 33 a, re administer calcium gluconate   DVT prophylaxsis Lovenox  Code Status:      Code Status Orders        Start     Ordered   02/04/16 1944  Full code   Continuous     02/04/16 1943     Family Communication: Discussed in detail with the patient, all imaging results, lab results explained to the patient   Disposition Plan:  GI consultation, colonoscopy on Monday     Consultants:  Gastroenterology  Procedures:  None  Antibiotics: Anti-infectives    Start     Dose/Rate Route Frequency Ordered Stop   02/05/16 0600  ciprofloxacin (CIPRO) IVPB 400 mg  400 mg 200 mL/hr over 60 Minutes Intravenous Every 12 hours 02/04/16 1943     02/05/16 0200  metroNIDAZOLE (FLAGYL) IVPB 500 mg     500 mg 100 mL/hr over 60 Minutes Intravenous Every 8 hours 02/04/16 1943     02/04/16 1800  ciprofloxacin (CIPRO) IVPB 400 mg     400 mg 200 mL/hr over 60 Minutes Intravenous  Once 02/04/16 1748     02/04/16 1800  metroNIDAZOLE (FLAGYL) IVPB 500 mg     500 mg 100 mL/hr over 60 Minutes Intravenous  Once 02/04/16 1748 02/04/16 2100          HPI/Subjective: Less frequent episodes of bloody stools yesterday, stable overnight  Objective: Filed Vitals:   02/05/16 1504 02/05/16 2132 02/06/16 0533 02/06/16 0926  BP: 115/90 121/41 100/45   Pulse: 117 98 89   Temp: 97.5 F (36.4 C) 98 F (36.7 C)    TempSrc: Oral     Resp: '16 18 16   '$ Height:      Weight:      SpO2: 94% 98% 96% 98%    Intake/Output Summary (Last 24 hours) at 02/06/16 1228 Last data filed at 02/05/16 1330  Gross per 24 hour  Intake    120 ml  Output      0 ml  Net    120 ml    Exam:  General: No acute respiratory distress Lungs: Clear to auscultation bilaterally without wheezes or crackles Cardiovascular: Regular rate and rhythm without murmur gallop or rub normal S1 and S2 Abdomen: Nontender, nondistended, soft, bowel sounds positive, no rebound, no ascites, no appreciable mass Extremities: No significant cyanosis, clubbing, or edema bilateral lower extremities     Data Review   Micro Results Recent Results (from the past 240 hour(s))  Gastrointestinal Panel by PCR , Stool     Status: None   Collection Time: 02/04/16  8:40 PM  Result Value Ref Range Status   Campylobacter species NOT DETECTED NOT DETECTED Final   Plesimonas shigelloides NOT DETECTED NOT DETECTED Final   Salmonella species NOT DETECTED NOT DETECTED Final   Yersinia enterocolitica NOT DETECTED NOT DETECTED Final   Vibrio species NOT DETECTED NOT DETECTED Final   Vibrio cholerae NOT DETECTED NOT DETECTED Final   Enteroaggregative E coli (EAEC) NOT DETECTED NOT DETECTED Final   Enteropathogenic E coli (EPEC) NOT DETECTED NOT DETECTED Final   Enterotoxigenic E coli (ETEC) NOT DETECTED NOT DETECTED Final   Shiga like toxin producing E coli (STEC) NOT DETECTED NOT DETECTED Final   E. coli O157 NOT DETECTED NOT DETECTED Final   Shigella/Enteroinvasive E coli (EIEC) NOT DETECTED NOT DETECTED Final   Cryptosporidium NOT DETECTED NOT DETECTED Final   Cyclospora  cayetanensis NOT DETECTED NOT DETECTED Final   Entamoeba histolytica NOT DETECTED NOT DETECTED Final   Giardia lamblia NOT DETECTED NOT DETECTED Final   Adenovirus F40/41 NOT DETECTED NOT DETECTED Final   Astrovirus NOT DETECTED NOT DETECTED Final   Norovirus GI/GII NOT DETECTED NOT DETECTED Final   Rotavirus A NOT DETECTED NOT DETECTED Final   Sapovirus (I, II, IV, and V) NOT DETECTED NOT DETECTED Final  C difficile quick scan w PCR reflex     Status: None   Collection Time: 02/04/16  8:40 PM  Result Value Ref Range Status   C Diff antigen NEGATIVE NEGATIVE Final   C Diff toxin NEGATIVE NEGATIVE Final   C Diff interpretation Negative for toxigenic C. difficile  Final    Radiology Reports  Ct Abdomen Pelvis Wo Contrast  02/04/2016  CLINICAL DATA:  Lower abdominal pain with gas and bloating, diarrhea for 4 days, bright red blood in stool, rectal bleeding, hypertension, rheumatoid arthritis, lung cancer post radiation therapy, diabetes mellitus, former smoker EXAM: CT ABDOMEN AND PELVIS WITHOUT CONTRAST TECHNIQUE: Multidetector CT imaging of the abdomen and pelvis was performed following the standard protocol without IV contrast. Sagittal and coronal MPR images reconstructed from axial data set. COMPARISON:  PET-CT 11/14/2013, chest CT 11/30/2015 FINDINGS: Soft tissue density adjacent to RIGHT heart border likely reflects RIGHT middle lobe atelectasis, unchanged. Calcified granuloma LEFT lower lobe image 6 with additional tiny LEFT lower lobe nodule image 9, stable. Calcified granulomata within spleen. 9 mm cyst LEFT lobe liver image 11 unchanged. Questionable 7 mm subcapsular lesion lateral RIGHT lobe liver image 34. Lesion anterior lateral segment LEFT lobe liver 19 x 13 mm image 19 previously 14 x 9 mm. Gallbladder surgically absent. Remainder of liver, spleen, pancreas, kidneys, and adrenal glands normal. Wall thickening of ascending colon through distal transverse colon consistent with colitis.  Associated significant hyperemia of transverse mesocolon. Cecum and distal colon appear unaffected. Stomach and small bowel loops normal appearance. Normal appendix Uterus surgically absent with nonvisualization of ovaries. No additional mass, adenopathy, free air free fluid. Bladder and ureters normal appearance. Scattered atherosclerotic calcifications. No acute osseous findings. IMPRESSION: Diffuse wall thickening of the ascending through distal transverse colon with associated hyperemia of the mesocolon compatible with colitis ; differential diagnosis includes infection and inflammatory bowel disease, ischemia less likely with this distribution though not excluded. Interval increase in size of a lesion at the lateral segment LEFT lobe liver question hepatic metastasis. Electronically Signed   By: Lavonia Dana M.D.   On: 02/04/2016 16:47   Dg Ankle Complete Left  02/01/2016  CLINICAL DATA:  75 year old female fell in kitchen. Ankle pain. Initial encounter. EXAM: LEFT ANKLE COMPLETE - 3+ VIEW COMPARISON:  07/22/2014. FINDINGS: Oblique fracture of distal left fibula with slight separation of fracture fragments. Slight irregularity of the cuboid. If there were tenderness in this region, left foot films recommended for further delineation. Plantar spur. IMPRESSION: Oblique fracture of distal left fibula with slight separation of fracture fragments. Slight irregularity of the cuboid. If there were tenderness in this region, left foot films recommended for further delineation. Electronically Signed   By: Genia Del M.D.   On: 02/01/2016 11:39   Dg Ankle Complete Right  02/01/2016  CLINICAL DATA:  [place] in kitchen this morning, patient states she was light headed when she fell, left ankle pain worse than right. Hx right ankle surgery x 2 by dr. Sullivan Lone: RIGHT ANKLE - COMPLETE 3+ VIEW COMPARISON:  08/01/2015 FINDINGS: Plate and screw fixation device within the distal right fibula. No acute fracture, subluxation or  dislocation. Soft tissues are intact. IMPRESSION: No acute bony abnormality. Electronically Signed   By: Rolm Baptise M.D.   On: 02/01/2016 11:36   Dg Chest Port 1 View  02/04/2016  CLINICAL DATA:  Cough, hypotension and diarrhea. History of squamous cell carcinoma of the lung. EXAM: PORTABLE CHEST 1 VIEW COMPARISON:  CT of the chest on 11/30/2015 FINDINGS: There is chronic volume loss of the right lung with dense chronic atelectasis and consolidation of the right middle lobe. No edema, infiltrate or pneumothorax identified. No pleural effusions are seen. The heart size is normal. IMPRESSION: Chronic right middle lobe atelectasis and consolidation. Electronically Signed   By: Aletta Edouard M.D.   On: 02/04/2016  22:02   Dg Foot Complete Left  02/01/2016  CLINICAL DATA:  75 year old female with ankle fracture. Foot pain. Post fall. Subsequent encounter. EXAM: LEFT FOOT - COMPLETE 3+ VIEW COMPARISON:  02/01/2016 ankle films. FINDINGS: Fracture of the distal left fibula as noted on ankle films. No other fracture or dislocation noted. Plantar spur. IMPRESSION: Fracture of the distal left fibula as noted on ankle films. No other fracture or dislocation noted. Electronically Signed   By: Genia Del M.D.   On: 02/01/2016 13:12     CBC  Recent Labs Lab 02/01/16 1139 02/04/16 1525 02/04/16 1534 02/04/16 2100 02/05/16 0401 02/06/16 0735  WBC 6.6 10.9*  --  8.5 7.3 6.2  HGB 14.2 15.1* 17.3* 12.8 11.9* 10.9*  HCT 41.5 46.0 51.0* 39.0 36.3 33.6*  PLT 172 252  --  216 177 188  MCV 86.8 89.3  --  89.4 89.0 90.3  MCH 29.7 29.3  --  29.4 29.2 29.3  MCHC 34.2 32.8  --  32.8 32.8 32.4  RDW 14.4 14.8  --  14.6 14.7 14.8  LYMPHSABS 0.4*  --   --   --   --   --   MONOABS 0.5  --   --   --   --   --   EOSABS 0.2  --   --   --   --   --   BASOSABS 0.0  --   --   --   --   --     Chemistries   Recent Labs Lab 02/01/16 1139 02/04/16 1525 02/04/16 1534 02/04/16 2334 02/05/16 0401 02/05/16 0858  02/06/16 0735  NA 135 134* 133* 129* 135  --  141  K 4.0 3.0* 2.9* 3.0* 3.2*  --  3.7  CL 96* 95* 93* 100* 103  --  107  CO2 28 24  --  20* 22  --  27  GLUCOSE 347* 312* 302* 388* 263*  --  132*  BUN 18 18 21* 18 16  --  6  CREATININE 0.87 1.69* 1.40* 1.37* 1.16*  --  0.82  CALCIUM 9.0 9.1  --  7.9* 7.9*  --  7.9*  MG  --   --   --   --   --  1.6*  --   AST  --  17  --   --  10*  --  8*  ALT  --  13*  --   --  10*  --  7*  ALKPHOS  --  119  --   --  86  --  78  BILITOT  --  1.1  --   --  0.8  --  0.6   ------------------------------------------------------------------------------------------------------------------ estimated creatinine clearance is 56.3 mL/min (by C-G formula based on Cr of 0.82). ------------------------------------------------------------------------------------------------------------------ No results for input(s): HGBA1C in the last 72 hours. ------------------------------------------------------------------------------------------------------------------ No results for input(s): CHOL, HDL, LDLCALC, TRIG, CHOLHDL, LDLDIRECT in the last 72 hours. ------------------------------------------------------------------------------------------------------------------  Recent Labs  02/05/16 0858  TSH 5.961*   ------------------------------------------------------------------------------------------------------------------ No results for input(s): VITAMINB12, FOLATE, FERRITIN, TIBC, IRON, RETICCTPCT in the last 72 hours.  Coagulation profile  Recent Labs Lab 02/04/16 1525  INR 0.91    No results for input(s): DDIMER in the last 72 hours.  Cardiac Enzymes No results for input(s): CKMB, TROPONINI, MYOGLOBIN in the last 168 hours.  Invalid input(s): CK ------------------------------------------------------------------------------------------------------------------ Invalid input(s): POCBNP   CBG:  Recent Labs Lab 02/05/16 1158 02/05/16 1727 02/05/16 2131  02/06/16 0739 02/06/16 1141  GLUCAP 314* 246* 304* 127* 83       Studies: Ct Abdomen Pelvis Wo Contrast  02/04/2016  CLINICAL DATA:  Lower abdominal pain with gas and bloating, diarrhea for 4 days, bright red blood in stool, rectal bleeding, hypertension, rheumatoid arthritis, lung cancer post radiation therapy, diabetes mellitus, former smoker EXAM: CT ABDOMEN AND PELVIS WITHOUT CONTRAST TECHNIQUE: Multidetector CT imaging of the abdomen and pelvis was performed following the standard protocol without IV contrast. Sagittal and coronal MPR images reconstructed from axial data set. COMPARISON:  PET-CT 11/14/2013, chest CT 11/30/2015 FINDINGS: Soft tissue density adjacent to RIGHT heart border likely reflects RIGHT middle lobe atelectasis, unchanged. Calcified granuloma LEFT lower lobe image 6 with additional tiny LEFT lower lobe nodule image 9, stable. Calcified granulomata within spleen. 9 mm cyst LEFT lobe liver image 11 unchanged. Questionable 7 mm subcapsular lesion lateral RIGHT lobe liver image 34. Lesion anterior lateral segment LEFT lobe liver 19 x 13 mm image 19 previously 14 x 9 mm. Gallbladder surgically absent. Remainder of liver, spleen, pancreas, kidneys, and adrenal glands normal. Wall thickening of ascending colon through distal transverse colon consistent with colitis. Associated significant hyperemia of transverse mesocolon. Cecum and distal colon appear unaffected. Stomach and small bowel loops normal appearance. Normal appendix Uterus surgically absent with nonvisualization of ovaries. No additional mass, adenopathy, free air free fluid. Bladder and ureters normal appearance. Scattered atherosclerotic calcifications. No acute osseous findings. IMPRESSION: Diffuse wall thickening of the ascending through distal transverse colon with associated hyperemia of the mesocolon compatible with colitis ; differential diagnosis includes infection and inflammatory bowel disease, ischemia less likely  with this distribution though not excluded. Interval increase in size of a lesion at the lateral segment LEFT lobe liver question hepatic metastasis. Electronically Signed   By: Lavonia Dana M.D.   On: 02/04/2016 16:47   Dg Chest Port 1 View  02/04/2016  CLINICAL DATA:  Cough, hypotension and diarrhea. History of squamous cell carcinoma of the lung. EXAM: PORTABLE CHEST 1 VIEW COMPARISON:  CT of the chest on 11/30/2015 FINDINGS: There is chronic volume loss of the right lung with dense chronic atelectasis and consolidation of the right middle lobe. No edema, infiltrate or pneumothorax identified. No pleural effusions are seen. The heart size is normal. IMPRESSION: Chronic right middle lobe atelectasis and consolidation. Electronically Signed   By: Aletta Edouard M.D.   On: 02/04/2016 22:02      No results found for: HGBA1C Lab Results  Component Value Date   CREATININE 0.82 02/06/2016       Scheduled Meds: . ciprofloxacin  400 mg Intravenous Once  . ciprofloxacin  400 mg Intravenous Q12H  . darifenacin  7.5 mg Oral Daily  . enoxaparin (LOVENOX) injection  40 mg Subcutaneous Q24H  . feeding supplement (GLUCERNA SHAKE)  237 mL Oral TID BM  . gabapentin  300 mg Oral BID  . insulin aspart  0-5 Units Subcutaneous QHS  . insulin aspart  0-9 Units Subcutaneous TID WC  . insulin detemir  20 Units Subcutaneous QHS  . metronidazole  500 mg Intravenous Q8H  . mometasone-formoterol  2 puff Inhalation BID  . saccharomyces boulardii  250 mg Oral BID  . sodium chloride flush  3 mL Intravenous Q12H   Continuous Infusions: . 0.9 % NaCl with KCl 40 mEq / L 100 mL/hr (02/06/16 0001)    Principal Problem:   Acute colitis Active Problems:   Malignant neoplasm of right upper lobe of lung ([facility])   Neuropathy due  to chemotherapeutic drug ([place])   Hypotension   Hypokalemia   Rheumatoid arthritis ([facility])   Acute kidney injury ([place])   DM type 2 (diabetes mellitus, type 2) ([place])   Abdominal pain   AKI  (acute kidney injury) ([place])   Abnormal finding on GI tract imaging   Diarrhea   Hematochezia    Time spent: 64 minutes   [facility] Pager [phone]. If 7PM-7AM, please contact night-coverage at [email or web address], password [facility] 02/06/2016, 12:28 PM  LOS: 2 days

## 2016-02-07 ENCOUNTER — Encounter (HOSPITAL_COMMUNITY): Payer: Self-pay | Admitting: *Deleted

## 2016-02-07 ENCOUNTER — Encounter (HOSPITAL_COMMUNITY)
Admission: EM | Disposition: A | Discharge status: Skilled Nursing Facility | Payer: Self-pay | Source: Home / Self Care | Attending: Emergency Medicine

## 2016-02-07 DIAGNOSIS — K529 Noninfective gastroenteritis and colitis, unspecified: Secondary | ICD-10-CM | POA: Diagnosis not present

## 2016-02-07 DIAGNOSIS — S82892A Other fracture of left lower leg, initial encounter for closed fracture: Secondary | ICD-10-CM | POA: Diagnosis not present

## 2016-02-07 DIAGNOSIS — K648 Other hemorrhoids: Secondary | ICD-10-CM | POA: Diagnosis not present

## 2016-02-07 DIAGNOSIS — K633 Ulcer of intestine: Secondary | ICD-10-CM | POA: Diagnosis not present

## 2016-02-07 HISTORY — PX: COLONOSCOPY: SHX5424

## 2016-02-07 LAB — GLUCOSE, CAPILLARY
GLUCOSE-CAPILLARY: 155 mg/dL — AB (ref 65–99)
GLUCOSE-CAPILLARY: 93 mg/dL (ref 65–99)
GLUCOSE-CAPILLARY: 307 mg/dL — AB (ref 65–99)
GLUCOSE-CAPILLARY: 282 mg/dL — AB (ref 65–99)
Glucose-Capillary: 185 mg/dL — ABNORMAL HIGH (ref 65–99)

## 2016-02-07 LAB — COMPREHENSIVE METABOLIC PANEL
ALBUMIN: 2.2 g/dL — AB (ref 3.5–5.0)
ALT: 9 U/L — ABNORMAL LOW (ref 14–54)
ANION GAP: 8 (ref 5–15)
AST: 13 U/L — AB (ref 15–41)
Alkaline Phosphatase: 86 U/L (ref 38–126)
CHLORIDE: 108 mmol/L (ref 101–111)
CO2: 23 mmol/L (ref 22–32)
Calcium: 8 mg/dL — ABNORMAL LOW (ref 8.9–10.3)
Creatinine, Ser: 0.72 mg/dL (ref 0.44–1.00)
GFR calc Af Amer: >60 mL/min (ref >60)
GFR calc non Af Amer: >60 mL/min (ref >60)
GLUCOSE: 187 mg/dL — AB (ref 65–99)
POTASSIUM: 4 mmol/L (ref 3.5–5.1)
SODIUM: 139 mmol/L (ref 135–145)
TOTAL PROTEIN: 4.4 g/dL — AB (ref 6.5–8.1)
Total Bilirubin: 0.5 mg/dL (ref 0.3–1.2)

## 2016-02-07 LAB — CBC
HCT: 34.9 % — ABNORMAL LOW (ref 36.0–46.0)
Hemoglobin: 11.2 g/dL — ABNORMAL LOW (ref 12.0–15.0)
MCH: 28.9 pg (ref 26.0–34.0)
MCHC: 32.1 g/dL (ref 30.0–36.0)
MCV: 89.9 fL (ref 78.0–100.0)
PLATELETS: 190 10*3/uL (ref 150–400)
RBC: 3.88 MIL/uL (ref 3.87–5.11)
RDW: 14.8 % (ref 11.5–15.5)
WBC: 5.5 10*3/uL (ref 4.0–10.5)

## 2016-02-07 SURGERY — COLONOSCOPY
Anesthesia: Moderate Sedation

## 2016-02-07 MED ORDER — MIDAZOLAM HCL 5 MG/ML IJ SOLN
INTRAMUSCULAR | Status: AC
  Filled 2016-02-07: qty 2

## 2016-02-07 MED ORDER — FENTANYL CITRATE (PF) 100 MCG/2ML IJ SOLN
INTRAMUSCULAR | Status: AC
  Filled 2016-02-07: qty 4

## 2016-02-07 MED ORDER — DIPHENHYDRAMINE HCL 50 MG/ML IJ SOLN
INTRAMUSCULAR | Status: AC
  Filled 2016-02-07: qty 1

## 2016-02-07 MED ORDER — SODIUM CHLORIDE 0.9 % IV SOLN
2.0000 g | Freq: Once | INTRAVENOUS | Status: AC
  Administered 2016-02-07: 2 g via INTRAVENOUS
  Filled 2016-02-07: qty 20

## 2016-02-07 MED ORDER — POTASSIUM CHLORIDE IN NACL 40-0.9 MEQ/L-% IV SOLN
INTRAVENOUS | Status: DC
  Administered 2016-02-07: 50 mL/h via INTRAVENOUS
  Filled 2016-02-07 (×4): qty 1000

## 2016-02-07 MED ORDER — FENTANYL CITRATE (PF) 100 MCG/2ML IJ SOLN
INTRAMUSCULAR | Status: DC | PRN
  Administered 2016-02-07 (×2): 25 ug via INTRAVENOUS

## 2016-02-07 MED ORDER — MIDAZOLAM HCL 10 MG/2ML IJ SOLN
INTRAMUSCULAR | Status: DC | PRN
  Administered 2016-02-07 (×3): 1 mg via INTRAVENOUS
  Administered 2016-02-07: 2 mg via INTRAVENOUS

## 2016-02-07 MED ORDER — HEPARIN SODIUM (PORCINE) 5000 UNIT/ML IJ SOLN
5000.0000 [IU] | Freq: Three times a day (TID) | INTRAMUSCULAR | Status: DC
  Administered 2016-02-07 – 2016-02-12 (×12): 5000 [IU] via SUBCUTANEOUS
  Filled 2016-02-07 (×13): qty 1

## 2016-02-07 NOTE — Op Note (Signed)
[facility] Patient Name: Megan [name] Procedure Date : 02/07/2016 MRN: [id number] Attending MD: Estill Cotta. Danis MD, MD Date of Birth: 1941/07/24 CSN: [id number] Age: 75 Admit Type: Inpatient Procedure:                Colonoscopy Indications:              Clinically significant diarrhea of unexplained                            origin, Hematochezia, Abnormal CT of the GI tract Providers:                Mallie Mussel L. Danis MD, MD, Kingsley Plan, RN, Cherylynn Ridges, Technician Referring MD:              Medicines:                Midazolam 5 mg IV, Fentanyl 50 micrograms IV Complications:            No immediate complications. Estimated Blood Loss:     Estimated blood loss: none. Estimated blood loss                            was minimal. Procedure:                Pre-Anesthesia Assessment:                           - Prior to the procedure, a History and Physical                            was performed, and patient medications and                            allergies were reviewed. The patient's tolerance of                            previous anesthesia was also reviewed. The risks                            and benefits of the procedure and the sedation                            options and risks were discussed with the patient.                            All questions were answered, and informed consent                            was obtained. Prior Anticoagulants: The patient has                            taken no previous anticoagulant or antiplatelet  agents. ASA Grade Assessment: III - A patient with                            severe systemic disease. After reviewing the risks                            and benefits, the patient was deemed in                            satisfactory condition to undergo the procedure.                           After obtaining informed consent, the colonoscope           was passed under direct vision. Throughout the                            procedure, the patient's blood pressure, pulse, and                            oxygen saturations were monitored continuously. The                            EC-[id number] ([id number]) scope was introduced through                            the anus and advanced to the the cecum, identified                            by appendiceal orifice and ileocecal valve. The                            colonoscopy was somewhat difficult due to a                            tortuous distal sigmoid colon. Successful                            completion of the procedure was aided by                            withdrawing the scope and replacing with the                            pediatric colonoscope. The patient tolerated the                            procedure well. The quality of the bowel                            preparation was excellent. The ileocecal valve,                            appendiceal orifice, and rectum were photographed.  The bowel preparation used was MoviPrep. Scope In: 11:31:45 AM Scope Out: 04:54:09 AM Scope Withdrawal Time: 0 hours 8 minutes 23 seconds  Total Procedure Duration: 0 hours 14 minutes 24 seconds  Findings:      The perianal and digital rectal examinations were normal.      An area of moderately congested mucosa was found from ascending colon to       the distal sigmoid, with associated paucity of vacularity. Biopsies were       taken with a cold forceps for histology from the right colon, proximal       transverse colon and sigmoid colon. The rectal mucosa was normal.      Internal hemorrhoids were found during retroflexion. The hemorrhoids       were Grade I (internal hemorrhoids that do not prolapse).      Multiple five mm ulcers were found in a short segment of the proximal       transverse colon. No bleeding was present.      The exam was otherwise without  abnormality on direct and retroflexion       views. Impression:               - nonspecifically Congested mucosa from ascending                            colon to rectum. Biopsied.                           - Internal hemorrhoids.                           - Multiple ulcers in the proximal transverse colon.                           - The examination was otherwise normal on direct                            and retroflexion views.                           Overall clinical picture most consistent with acute                            infectious colitis, less likely IBD and doubtful to                            be ischemia. Moderate Sedation:      Moderate (conscious) sedation was administered by the endoscopy nurse       and supervised by the endoscopist. The following parameters were       monitored: oxygen saturation, heart rate, blood pressure, respiratory       rate, EKG, adequacy of pulmonary ventilation, and response to care. Recommendation:           - Diabetic (ADA) diet.                           - Await pathology results.                           -  Discontinue antibiotics today. (done)                           - No repeat routine colonoscopy due to age.                           The patient can go home anytime from a GI                            perspective. Our office will call with biopsy                            results and follow up plan. Procedure Code(s):        --- Professional ---                           [phone], Colonoscopy, flexible; with biopsy, single                            or multiple Diagnosis Code(s):        --- Professional ---                           K63.89, Other specified diseases of intestine                           K63.3, Ulcer of intestine                           K64.0, First degree hemorrhoids                           R19.7, Diarrhea, unspecified                           K92.1, Melena (includes Hematochezia)                            R93.3, Abnormal findings on diagnostic imaging of                            other parts of digestive tract CPT copyright 2016 [employer]. All rights reserved. The codes documented in this report are preliminary and upon coder review may  be revised to meet current compliance requirements. Andreanna Mikolajczak L. Danis MD, MD 02/07/2016 12:01:50 PM This report has been signed electronically. Number of Addenda: 0

## 2016-02-07 NOTE — Progress Notes (Signed)
[facility] Progress Note                                                                                                                                                                                                                      Patient Demographics:    Megan [name], is a 75 y.o. female, DOB - 28-Jun-1941, QIH:[id number]  Admit date - 02/04/2016   Admitting Physician Bonnielee Haff, MD  Outpatient Primary MD for the patient is [name], MD  LOS - 3  Outpatient Specialists:   Chief Complaint  Patient presents with  . Hypotension  . Rectal Bleeding        Subjective:    Megan [name] today has, No headache, No chest pain, No abdominal pain - No Nausea, No new weakness tingling or numbness, No Cough - SOB. Improved diarrhea.   Assessment  & Plan :     1. Acute diffuse colitis with diarrhea. Diarrhea has improved, GI pathogen panel negative, H&H when accounted for dilution with IV fluids is stable. No further GI bleed. GI on board, currently on IV Cipro Flagyl and bowel rest, due for colonoscopy later on 02/07/2016. Continue supportive care and monitor.  2. ARF due to dehydration with hypotension all caused by diarrhea and decreased oral intake along with ARB. Resolved with hydration. Discontinue ARB as she has recurrent episodes of syncope at home which sound like orthostatic episodes.  3. Syncope due to orthostatic hypotension from diarrhea and dehydration causing left distal fibula fracture. Placed in cast, Dr. Sharol Given to follow. Likely surgery on Monday, no weightbearing for now.  4. History of lung cancer. Currently off treatment, follow with oncology post discharge.  5. Multiple episodes of syncope. Most likely due to hypotension, hold ARB upon discharge, will add 30 day event monitor and outpatient cardiology follow-up as well.  6. Hypocalcemia.  Mild, due to GI losses, was stable upon admission. Replace and monitor Ion. Calcium.   7. DM type II on Lantus and sliding scale.  CBG (last 3)   Recent Labs  02/06/16 2244 02/07/16 0355 02/07/16 0800  GLUCAP 359* 185* 155*     No results found for: HGBA1C   Code Status : Full  Family Communication  : None  Disposition Plan  : To be decided  Barriers For Discharge : GI evaluation  Consults  :  GI, orthopedics  Procedures  :   CT abdomen pelvis with diffuse colitis  Colonoscopy due  DVT Prophylaxis  :  Heparin -  SCDs    Lab Results  Component Value Date   PLT 190 02/07/2016    Antibiotics  :     Anti-infectives    Start     Dose/Rate Route Frequency Ordered Stop   02/05/16 0600  ciprofloxacin (CIPRO) IVPB 400 mg     400 mg 200 mL/hr over 60 Minutes Intravenous Every 12 hours 02/04/16 1943     02/05/16 0200  metroNIDAZOLE (FLAGYL) IVPB 500 mg     500 mg 100 mL/hr over 60 Minutes Intravenous Every 8 hours 02/04/16 1943     02/04/16 1800  ciprofloxacin (CIPRO) IVPB 400 mg     400 mg 200 mL/hr over 60 Minutes Intravenous  Once 02/04/16 1748     02/04/16 1800  metroNIDAZOLE (FLAGYL) IVPB 500 mg     500 mg 100 mL/hr over 60 Minutes Intravenous  Once 02/04/16 1748 02/04/16 2100        Objective:   Filed Vitals:   02/06/16 0926 02/06/16 1348 02/06/16 2240 02/07/16 0416  BP:  116/89 130/59 116/69  Pulse:  89 102 87  Temp:  98.7 F (37.1 C) 98.1 F (36.7 C) 97.7 F (36.5 C)  TempSrc:   Oral Oral  Resp:  '19 18 18  '$ Height:      Weight:      SpO2: 98%  99% 97%    Wt Readings from Last 3 Encounters:  02/04/16 70.353 kg (155 lb 1.6 oz)  02/04/16 63.957 kg (141 lb)  12/01/15 61.735 kg (136 lb 1.6 oz)     Intake/Output Summary (Last 24 hours) at 02/07/16 1006 Last data filed at 02/07/16 0930  Gross per 24 hour  Intake   1100 ml  Output    600 ml  Net    500 ml     Physical Exam  Awake Alert, Oriented X 3, No new F.N deficits, Normal  affect [place].AT,PERRAL Supple Neck,No JVD, No cervical lymphadenopathy appriciated.  Symmetrical Chest wall movement, Good air movement bilaterally, CTAB RRR,No Gallops,Rubs or new Murmurs, No Parasternal Heave +ve B.Sounds, Abd Soft, No tenderness, No organomegaly appriciated, No rebound - guarding or rigidity. No Cyanosis, Clubbing or edema, No new Rash or bruise, L leg in CAM boot/cast    Data Review:    CBC  Recent Labs Lab 02/01/16 1139 02/04/16 1525 02/04/16 1534 02/04/16 2100 02/05/16 0401 02/06/16 0735 02/07/16 0740  WBC 6.6 10.9*  --  8.5 7.3 6.2 5.5  HGB 14.2 15.1* 17.3* 12.8 11.9* 10.9* 11.2*  HCT 41.5 46.0 51.0* 39.0 36.3 33.6* 34.9*  PLT 172 252  --  216 177 188 190  MCV 86.8 89.3  --  89.4 89.0 90.3 89.9  MCH 29.7 29.3  --  29.4 29.2 29.3 28.9  MCHC 34.2 32.8  --  32.8 32.8 32.4 32.1  RDW 14.4 14.8  --  14.6 14.7 14.8 14.8  LYMPHSABS 0.4*  --   --   --   --   --   --   MONOABS 0.5  --   --   --   --   --   --   EOSABS 0.2  --   --   --   --   --   --   BASOSABS 0.0  --   --   --   --   --   --     Chemistries   Recent Labs Lab 02/04/16 1525 02/04/16 1534 02/04/16 2334 02/05/16 0401 02/05/16 0076 02/06/16 0735 02/07/16 0740  NA 134* 133* 129* 135  --  141 139  K 3.0* 2.9* 3.0* 3.2*  --  3.7 4.0  CL 95* 93* 100* 103  --  107 108  CO2 24  --  20* 22  --  27 23  GLUCOSE 312* 302* 388* 263*  --  132* 187*  BUN 18 21* 18 16  --  6 <5*  CREATININE 1.69* 1.40* 1.37* 1.16*  --  0.82 0.72  CALCIUM 9.1  --  7.9* 7.9*  --  7.9* 8.0*  MG  --   --   --   --  1.6*  --   --   AST 17  --   --  10*  --  8* 13*  ALT 13*  --   --  10*  --  7* 9*  ALKPHOS 119  --   --  86  --  78 86  BILITOT 1.1  --   --  0.8  --  0.6 0.5   ------------------------------------------------------------------------------------------------------------------ No results for input(s): CHOL, HDL, LDLCALC, TRIG, CHOLHDL, LDLDIRECT in the last 72 hours.  No results found for:  HGBA1C ------------------------------------------------------------------------------------------------------------------  Recent Labs  02/05/16 0858  TSH 5.961*   ------------------------------------------------------------------------------------------------------------------ No results for input(s): VITAMINB12, FOLATE, FERRITIN, TIBC, IRON, RETICCTPCT in the last 72 hours.  Coagulation profile  Recent Labs Lab 02/04/16 1525  INR 0.91   CBG (last 3)   Recent Labs  02/06/16 2244 02/07/16 0355 02/07/16 0800  GLUCAP 359* 185* 155*     No results for input(s): DDIMER in the last 72 hours.  Cardiac Enzymes No results for input(s): CKMB, TROPONINI, MYOGLOBIN in the last 168 hours.  Invalid input(s): CK ------------------------------------------------------------------------------------------------------------------ No results found for: BNP  Inpatient Medications  Scheduled Meds: . calcium gluconate  2 g Intravenous Once  . ciprofloxacin  400 mg Intravenous Once  . ciprofloxacin  400 mg Intravenous Q12H  . darifenacin  7.5 mg Oral Daily  . feeding supplement (GLUCERNA SHAKE)  237 mL Oral TID BM  . gabapentin  300 mg Oral BID  . insulin aspart  0-5 Units Subcutaneous QHS  . insulin aspart  0-9 Units Subcutaneous TID WC  . insulin detemir  20 Units Subcutaneous QHS  . metronidazole  500 mg Intravenous Q8H  . mometasone-formoterol  2 puff Inhalation BID  . saccharomyces boulardii  250 mg Oral BID  . sodium chloride flush  3 mL Intravenous Q12H  . Vitamin D (Ergocalciferol)  50,000 Units Oral Q7 days   Continuous Infusions: . 0.9 % NaCl with KCl 40 mEq / L     PRN Meds:.acetaminophen **OR** [DISCONTINUED] acetaminophen, HYDROcodone-acetaminophen, ketoconazole, ondansetron **OR** ondansetron (ZOFRAN) IV  Micro Results Recent Results (from the past 240 hour(s))  Gastrointestinal Panel by PCR , Stool     Status: None   Collection Time: 02/04/16  8:40 PM  Result  Value Ref Range Status   Campylobacter species NOT DETECTED NOT DETECTED Final   Plesimonas shigelloides NOT DETECTED NOT DETECTED Final   Salmonella species NOT DETECTED NOT DETECTED Final   Yersinia enterocolitica NOT DETECTED NOT DETECTED Final   Vibrio species NOT DETECTED NOT DETECTED Final   Vibrio cholerae NOT DETECTED NOT DETECTED Final   Enteroaggregative E coli (EAEC) NOT DETECTED NOT DETECTED Final   Enteropathogenic E coli (EPEC) NOT DETECTED NOT DETECTED Final   Enterotoxigenic E coli (ETEC) NOT DETECTED NOT DETECTED Final   Shiga like toxin producing E coli (STEC) NOT DETECTED NOT DETECTED Final  E. coli O157 NOT DETECTED NOT DETECTED Final   Shigella/Enteroinvasive E coli (EIEC) NOT DETECTED NOT DETECTED Final   Cryptosporidium NOT DETECTED NOT DETECTED Final   Cyclospora cayetanensis NOT DETECTED NOT DETECTED Final   Entamoeba histolytica NOT DETECTED NOT DETECTED Final   Giardia lamblia NOT DETECTED NOT DETECTED Final   Adenovirus F40/41 NOT DETECTED NOT DETECTED Final   Astrovirus NOT DETECTED NOT DETECTED Final   Norovirus GI/GII NOT DETECTED NOT DETECTED Final   Rotavirus A NOT DETECTED NOT DETECTED Final   Sapovirus (I, II, IV, and V) NOT DETECTED NOT DETECTED Final  C difficile quick scan w PCR reflex     Status: None   Collection Time: 02/04/16  8:40 PM  Result Value Ref Range Status   C Diff antigen NEGATIVE NEGATIVE Final   C Diff toxin NEGATIVE NEGATIVE Final   C Diff interpretation Negative for toxigenic C. difficile  Final    Radiology Reports Ct Abdomen Pelvis Wo Contrast  02/04/2016  CLINICAL DATA:  Lower abdominal pain with gas and bloating, diarrhea for 4 days, bright red blood in stool, rectal bleeding, hypertension, rheumatoid arthritis, lung cancer post radiation therapy, diabetes mellitus, former smoker EXAM: CT ABDOMEN AND PELVIS WITHOUT CONTRAST TECHNIQUE: Multidetector CT imaging of the abdomen and pelvis was performed following the standard  protocol without IV contrast. Sagittal and coronal MPR images reconstructed from axial data set. COMPARISON:  PET-CT 11/14/2013, chest CT 11/30/2015 FINDINGS: Soft tissue density adjacent to RIGHT heart border likely reflects RIGHT middle lobe atelectasis, unchanged. Calcified granuloma LEFT lower lobe image 6 with additional tiny LEFT lower lobe nodule image 9, stable. Calcified granulomata within spleen. 9 mm cyst LEFT lobe liver image 11 unchanged. Questionable 7 mm subcapsular lesion lateral RIGHT lobe liver image 34. Lesion anterior lateral segment LEFT lobe liver 19 x 13 mm image 19 previously 14 x 9 mm. Gallbladder surgically absent. Remainder of liver, spleen, pancreas, kidneys, and adrenal glands normal. Wall thickening of ascending colon through distal transverse colon consistent with colitis. Associated significant hyperemia of transverse mesocolon. Cecum and distal colon appear unaffected. Stomach and small bowel loops normal appearance. Normal appendix Uterus surgically absent with nonvisualization of ovaries. No additional mass, adenopathy, free air free fluid. Bladder and ureters normal appearance. Scattered atherosclerotic calcifications. No acute osseous findings. IMPRESSION: Diffuse wall thickening of the ascending through distal transverse colon with associated hyperemia of the mesocolon compatible with colitis ; differential diagnosis includes infection and inflammatory bowel disease, ischemia less likely with this distribution though not excluded. Interval increase in size of a lesion at the lateral segment LEFT lobe liver question hepatic metastasis. Electronically Signed   By: Lavonia Dana M.D.   On: 02/04/2016 16:47   Dg Ankle Complete Left  02/01/2016  CLINICAL DATA:  75 year old female fell in kitchen. Ankle pain. Initial encounter. EXAM: LEFT ANKLE COMPLETE - 3+ VIEW COMPARISON:  07/22/2014. FINDINGS: Oblique fracture of distal left fibula with slight separation of fracture fragments.  Slight irregularity of the cuboid. If there were tenderness in this region, left foot films recommended for further delineation. Plantar spur. IMPRESSION: Oblique fracture of distal left fibula with slight separation of fracture fragments. Slight irregularity of the cuboid. If there were tenderness in this region, left foot films recommended for further delineation. Electronically Signed   By: Genia Del M.D.   On: 02/01/2016 11:39   Dg Ankle Complete Right  02/01/2016  CLINICAL DATA:  [place] in kitchen this morning, patient states she was light headed when  she fell, left ankle pain worse than right. Hx right ankle surgery x 2 by dr. Sullivan Lone: RIGHT ANKLE - COMPLETE 3+ VIEW COMPARISON:  08/01/2015 FINDINGS: Plate and screw fixation device within the distal right fibula. No acute fracture, subluxation or dislocation. Soft tissues are intact. IMPRESSION: No acute bony abnormality. Electronically Signed   By: Rolm Baptise M.D.   On: 02/01/2016 11:36   Dg Chest Port 1 View  02/04/2016  CLINICAL DATA:  Cough, hypotension and diarrhea. History of squamous cell carcinoma of the lung. EXAM: PORTABLE CHEST 1 VIEW COMPARISON:  CT of the chest on 11/30/2015 FINDINGS: There is chronic volume loss of the right lung with dense chronic atelectasis and consolidation of the right middle lobe. No edema, infiltrate or pneumothorax identified. No pleural effusions are seen. The heart size is normal. IMPRESSION: Chronic right middle lobe atelectasis and consolidation. Electronically Signed   By: Aletta Edouard M.D.   On: 02/04/2016 22:02   Dg Foot Complete Left  02/01/2016  CLINICAL DATA:  75 year old female with ankle fracture. Foot pain. Post fall. Subsequent encounter. EXAM: LEFT FOOT - COMPLETE 3+ VIEW COMPARISON:  02/01/2016 ankle films. FINDINGS: Fracture of the distal left fibula as noted on ankle films. No other fracture or dislocation noted. Plantar spur. IMPRESSION: Fracture of the distal left fibula as noted on ankle  films. No other fracture or dislocation noted. Electronically Signed   By: Genia Del M.D.   On: 02/01/2016 13:12    Time Spent in minutes  30   Markelle Najarian K M.D on 02/07/2016 at 10:06 AM  Between 7am to 7pm - Pager - [phone]  After 7pm go to [email or web address] - password [facility]  [facility] -  Office  [phone]

## 2016-02-07 NOTE — Interval H&P Note (Signed)
History and Physical Interval Note:  02/07/2016 11:16 AM  Megan [name]  has presented today for surgery, with the diagnosis of colitis  The various methods of treatment have been discussed with the patient and family. After consideration of risks, benefits and other options for treatment, the patient has consented to  Procedure(s): COLONOSCOPY (N/A) as a surgical intervention .  The patient's history has been reviewed, patient examined, no change in status, stable for surgery.  I have reviewed the patient's chart and labs.  Questions were answered to the patient's satisfaction.     Nelida Meuse III

## 2016-02-07 NOTE — H&P (View-Only) (Signed)
Patient ID: Megan [name], female   DOB: 1941-05-17, 75 y.o.   MRN: [id number]    Progress Note   Subjective    Feeling better, no abdominal pain or cramping, no obvious blood-still having diarrhea but less  GI path panel neg Cdiff quick screen Neg   Objective   Vital signs in last 24 hours: Temp:  [97.5 F (36.4 C)-98 F (36.7 C)] 98 F (36.7 C) (04/07 2132) Pulse Rate:  [89-117] 89 (04/08 0533) Resp:  [16-18] 16 (04/08 0533) BP: (100-121)/(41-90) 100/45 mmHg (04/08 0533) SpO2:  [94 %-98 %] 98 % (04/08 0926) Last BM Date: 02/06/16 General: older    white female in NAD Heart:  Regular rate and rhythm; no murmurs Lungs: Respirations even and unlabored, lungs CTA bilaterally Abdomen:  Soft, nontender and nondistended. Normal bowel sounds. Extremities:  Without edem boot on left LE. Neurologic:  Alert and oriented,  grossly normal neurologically. Psych:  Cooperative. Normal mood and affect.  Intake/Output from previous day: 04/07 0701 - 04/08 0700 In: 343 [P.O.:340; I.V.:3] Out: 1000 [Urine:1000] Intake/Output this shift:    Lab Results:  Recent Labs  02/04/16 2100 02/05/16 0401 02/06/16 0735  WBC 8.5 7.3 6.2  HGB 12.8 11.9* 10.9*  HCT 39.0 36.3 33.6*  PLT 216 177 188   BMET  Recent Labs  02/04/16 2334 02/05/16 0401 02/06/16 0735  NA 129* 135 141  K 3.0* 3.2* 3.7  CL 100* 103 107  CO2 20* 22 27  GLUCOSE 388* 263* 132*  BUN '18 16 6  ' CREATININE 1.37* 1.16* 0.82  CALCIUM 7.9* 7.9* 7.9*   LFT  Recent Labs  02/06/16 0735  PROT 4.1*  ALBUMIN 2.0*  AST 8*  ALT 7*  ALKPHOS 78  BILITOT 0.6   PT/INR  Recent Labs  02/04/16 1525  LABPROT 12.5  INR 0.91    Studies/Results: Ct Abdomen Pelvis Wo Contrast  02/04/2016  CLINICAL DATA:  Lower abdominal pain with gas and bloating, diarrhea for 4 days, bright red blood in stool, rectal bleeding, hypertension, rheumatoid arthritis, lung cancer post radiation therapy, diabetes mellitus, former smoker  EXAM: CT ABDOMEN AND PELVIS WITHOUT CONTRAST TECHNIQUE: Multidetector CT imaging of the abdomen and pelvis was performed following the standard protocol without IV contrast. Sagittal and coronal MPR images reconstructed from axial data set. COMPARISON:  PET-CT 11/14/2013, chest CT 11/30/2015 FINDINGS: Soft tissue density adjacent to RIGHT heart border likely reflects RIGHT middle lobe atelectasis, unchanged. Calcified granuloma LEFT lower lobe image 6 with additional tiny LEFT lower lobe nodule image 9, stable. Calcified granulomata within spleen. 9 mm cyst LEFT lobe liver image 11 unchanged. Questionable 7 mm subcapsular lesion lateral RIGHT lobe liver image 34. Lesion anterior lateral segment LEFT lobe liver 19 x 13 mm image 19 previously 14 x 9 mm. Gallbladder surgically absent. Remainder of liver, spleen, pancreas, kidneys, and adrenal glands normal. Wall thickening of ascending colon through distal transverse colon consistent with colitis. Associated significant hyperemia of transverse mesocolon. Cecum and distal colon appear unaffected. Stomach and small bowel loops normal appearance. Normal appendix Uterus surgically absent with nonvisualization of ovaries. No additional mass, adenopathy, free air free fluid. Bladder and ureters normal appearance. Scattered atherosclerotic calcifications. No acute osseous findings. IMPRESSION: Diffuse wall thickening of the ascending through distal transverse colon with associated hyperemia of the mesocolon compatible with colitis ; differential diagnosis includes infection and inflammatory bowel disease, ischemia less likely with this distribution though not excluded. Interval increase in size of a lesion at the lateral  segment LEFT lobe liver question hepatic metastasis. Electronically Signed   By: Lavonia Dana M.D.   On: 02/04/2016 16:47   Dg Chest Port 1 View  02/04/2016  CLINICAL DATA:  Cough, hypotension and diarrhea. History of squamous cell carcinoma of the lung.  EXAM: PORTABLE CHEST 1 VIEW COMPARISON:  CT of the chest on 11/30/2015 FINDINGS: There is chronic volume loss of the right lung with dense chronic atelectasis and consolidation of the right middle lobe. No edema, infiltrate or pneumothorax identified. No pleural effusions are seen. The heart size is normal. IMPRESSION: Chronic right middle lobe atelectasis and consolidation. Electronically Signed   By: Aletta Edouard M.D.   On: 02/04/2016 22:02       Assessment / Plan:    #1 75 yo female with acute diarrheal illness with right sided colitis on CT Infectious work up negative- she is being covered with Cipro and flagyl  Plan is for colonoscopy in am tomorrow Clear liquids today, prep this evening  have stopped lovenox for today  #2 Sqamous cell lung CA - no active treatment currently  However CT shows new possible liver met- needs to see Dr Julien Nordmann very soon after D/C #3 anemia  #4 RA #5 AKI - improved #6 hypokalemia- resolved #7 AODM  Principal Problem:   Acute colitis Active Problems:   Malignant neoplasm of right upper lobe of lung ([place])   Neuropathy due to chemotherapeutic drug ([place])   Hypotension   Hypokalemia   Rheumatoid arthritis ([place])   Acute kidney injury ([place])   DM type 2 (diabetes mellitus, type 2) ([place])   Abdominal pain   AKI (acute kidney injury) ([place])   Abnormal finding on GI tract imaging   Diarrhea   Hematochezia     LOS: 2 days   Amy Esterwood  02/06/2016, 12:51 PM  I have discussed the case with the PA, and that is the plan I formulated.  CC: diarrhea  We will proceed with colonoscopy tomorrow.

## 2016-02-08 ENCOUNTER — Ambulatory Visit: Payer: Self-pay | Admitting: Neurology

## 2016-02-08 ENCOUNTER — Other Ambulatory Visit (HOSPITAL_COMMUNITY): Payer: Self-pay | Admitting: Family

## 2016-02-08 ENCOUNTER — Other Ambulatory Visit: Payer: Self-pay | Admitting: Physician Assistant

## 2016-02-08 DIAGNOSIS — K529 Noninfective gastroenteritis and colitis, unspecified: Secondary | ICD-10-CM | POA: Diagnosis not present

## 2016-02-08 DIAGNOSIS — R55 Syncope and collapse: Secondary | ICD-10-CM

## 2016-02-08 LAB — GLUCOSE, CAPILLARY
GLUCOSE-CAPILLARY: 342 mg/dL — AB (ref 65–99)
Glucose-Capillary: 310 mg/dL — ABNORMAL HIGH (ref 65–99)

## 2016-02-08 LAB — COMPREHENSIVE METABOLIC PANEL
ALBUMIN: 2.3 g/dL — AB (ref 3.5–5.0)
ALK PHOS: 88 U/L (ref 38–126)
ALT: 9 U/L — AB (ref 14–54)
ANION GAP: 11 (ref 5–15)
AST: 17 U/L (ref 15–41)
BUN: <5 mg/dL — ABNORMAL LOW (ref 6–20)
CALCIUM: 8.1 mg/dL — AB (ref 8.9–10.3)
CHLORIDE: 107 mmol/L (ref 101–111)
CO2: 23 mmol/L (ref 22–32)
Creatinine, Ser: 0.6 mg/dL (ref 0.44–1.00)
GFR calc non Af Amer: >60 mL/min (ref >60)
GLUCOSE: 118 mg/dL — AB (ref 65–99)
Potassium: 3.8 mmol/L (ref 3.5–5.1)
SODIUM: 141 mmol/L (ref 135–145)
Total Bilirubin: 0.5 mg/dL (ref 0.3–1.2)
Total Protein: 4.4 g/dL — ABNORMAL LOW (ref 6.5–8.1)

## 2016-02-08 LAB — CBC
HCT: 34.5 % — ABNORMAL LOW (ref 36.0–46.0)
HEMOGLOBIN: 11.5 g/dL — AB (ref 12.0–15.0)
MCH: 29.9 pg (ref 26.0–34.0)
MCHC: 33.3 g/dL (ref 30.0–36.0)
MCV: 89.6 fL (ref 78.0–100.0)
PLATELETS: 198 10*3/uL (ref 150–400)
RBC: 3.85 MIL/uL — AB (ref 3.87–5.11)
RDW: 14.8 % (ref 11.5–15.5)
WBC: 6.7 10*3/uL (ref 4.0–10.5)

## 2016-02-08 LAB — TYPE AND SCREEN
ABO/RH(D): O NEG
Antibody Screen: NEGATIVE

## 2016-02-08 MED ORDER — PRO-STAT SUGAR FREE PO LIQD
30.0000 mL | Freq: Three times a day (TID) | ORAL | Status: DC
  Administered 2016-02-08: 30 mL via ORAL
  Filled 2016-02-08 (×2): qty 30

## 2016-02-08 MED ORDER — INSULIN DETEMIR 100 UNIT/ML ~~LOC~~ SOLN
25.0000 [IU] | Freq: Every day | SUBCUTANEOUS | Status: DC
  Filled 2016-02-08: qty 0.25

## 2016-02-08 MED ORDER — METOPROLOL TARTRATE 1 MG/ML IV SOLN: 5.0000 mg | INTRAVENOUS | Status: DC | PRN

## 2016-02-08 NOTE — Progress Notes (Signed)
Asked to arrange 30-day event monitor and f/u appt for recurrent syncope.  Office contacted, they will call me when the appointments are arranged.   Rosaria Ferries, Hershal Coria 02/08/2016 4:24 PM Beeper [phone]

## 2016-02-08 NOTE — Progress Notes (Signed)
TR[facility] Progress Note                                                                                                                                                                                                                      Patient Demographics:    Megan [name], is a 75 y.o. female, DOB - [birth year], XQJ:[id number]  Admit date - 02/04/2016   Admitting Physician Bonnielee Haff, MD  Outpatient Primary MD for the patient is [name], MD  LOS - 4  Outpatient Specialists:   Chief Complaint  Patient presents with  . Hypotension  . Rectal Bleeding        Subjective:    Megan [name] today has, No headache, No chest pain, No abdominal pain - No Nausea, No new weakness tingling or numbness, No Cough - SOB. Improved diarrhea.Minimal left leg pain.   Assessment  & Plan :     1. Acute diffuse colitis with diarrhea. Diarrhea has improved, GI pathogen panel negative, H&H when accounted for dilution with IV fluids is stable. No further GI bleed. GI on board, currently on IV Cipro Flagyl and bowel rest, Had colonoscopy on 02/07/2016. Colonoscopy suggested most likely of infectious colitis, continue present care, symptoms much improved.   2. ARF due to dehydration with hypotension all caused by diarrhea and decreased oral intake along with ARB. Resolved with hydration. Discontinue ARB as she has recurrent episodes of syncope at home which sound like orthostatic episodes.  3. Fall with left distal fibula fracture. Placed in cast, Dr. Sharol Given to follow. Likely surgery on 02/10/2016, no weightbearing for now.  4. History of lung cancer. Currently off treatment, follow with oncology post discharge.  5. Multiple episodes of syncope. Most likely due to hypotension, hold ARB upon discharge, will add 30 day event monitor and outpatient cardiology follow-up as well.  Discussed with cardiologist.  6. Hypocalcemia. Mild, due to GI losses, was stable upon admission. Replace and monitor Ion. Calcium.   7. DM type II on Lantus and sliding scale.  CBG (last 3)   Recent Labs  02/07/16 1223 02/07/16 1637 02/07/16 2109  GLUCAP 93 307* 282*     No results found for: HGBA1C   Code Status : Full  Family Communication  : None  Disposition Plan  : To be decided  Barriers For Discharge : GI evaluation  Consults  :  GI, orthopedics  Procedures  :   CT abdomen pelvis with diffuse colitis  Colonoscopy None for 07/20/2016 with  colonic ulcers noted suggestive of most likely infection versus IBD, less likely ischemia.  Left distal fibula surgical correction due on 02/10/2016  DVT Prophylaxis  :  Heparin - SCDs    Lab Results  Component Value Date   PLT 198 02/08/2016    Antibiotics  :     Anti-infectives    Start     Dose/Rate Route Frequency Ordered Stop   02/05/16 0600  [MAR Hold]  ciprofloxacin (CIPRO) IVPB 400 mg  Status:  Discontinued     (MAR Hold since 02/07/16 1104)   400 mg 200 mL/hr over 60 Minutes Intravenous Every 12 hours 02/04/16 1943 02/07/16 1239   02/05/16 0200  [MAR Hold]  metroNIDAZOLE (FLAGYL) IVPB 500 mg  Status:  Discontinued     (MAR Hold since 02/07/16 1104)   500 mg 100 mL/hr over 60 Minutes Intravenous Every 8 hours 02/04/16 1943 02/07/16 1239   02/04/16 1800  ciprofloxacin (CIPRO) IVPB 400 mg  Status:  Discontinued     400 mg 200 mL/hr over 60 Minutes Intravenous  Once 02/04/16 1748 02/07/16 1311   02/04/16 1800  metroNIDAZOLE (FLAGYL) IVPB 500 mg     500 mg 100 mL/hr over 60 Minutes Intravenous  Once 02/04/16 1748 02/04/16 2100        Objective:   Filed Vitals:   02/07/16 1200 02/07/16 1407 02/07/16 2106 02/08/16 0528  BP: 134/69 118/49 157/90 115/61  Pulse: 95 91 103 91  Temp:  97.8 F (36.6 C) 98.1 F (36.7 C) 98.4 F (36.9 C)  TempSrc:  Oral Oral Oral  Resp: '14 19 18 16  '$ Height:      Weight:       SpO2: 98% 96% 96% 98%    Wt Readings from Last 3 Encounters:  02/04/16 70.353 kg (155 lb 1.6 oz)  02/04/16 63.957 kg (141 lb)  12/01/15 61.735 kg (136 lb 1.6 oz)     Intake/Output Summary (Last 24 hours) at 02/08/16 0943 Last data filed at 02/08/16 0929  Gross per 24 hour  Intake  736.6 ml  Output    900 ml  Net -163.4 ml     Physical Exam  Awake Alert, Oriented X 3, No new F.N deficits, Normal affect [place].AT,PERRAL Supple Neck,No JVD, No cervical lymphadenopathy appriciated.  Symmetrical Chest wall movement, Good air movement bilaterally, CTAB RRR,No Gallops,Rubs or new Murmurs, No Parasternal Heave +ve B.Sounds, Abd Soft, No tenderness, No organomegaly appriciated, No rebound - guarding or rigidity. No Cyanosis, Clubbing or edema, No new Rash or bruise, L leg in CAM boot/cast    Data Review:    CBC  Recent Labs Lab 02/01/16 1139  02/04/16 2100 02/05/16 0401 02/06/16 0735 02/07/16 0740 02/08/16 0530  WBC 6.6  < > 8.5 7.3 6.2 5.5 6.7  HGB 14.2  < > 12.8 11.9* 10.9* 11.2* 11.5*  HCT 41.5  < > 39.0 36.3 33.6* 34.9* 34.5*  PLT 172  < > 216 177 188 190 198  MCV 86.8  < > 89.4 89.0 90.3 89.9 89.6  MCH 29.7  < > 29.4 29.2 29.3 28.9 29.9  MCHC 34.2  < > 32.8 32.8 32.4 32.1 33.3  RDW 14.4  < > 14.6 14.7 14.8 14.8 14.8  LYMPHSABS 0.4*  --   --   --   --   --   --   MONOABS 0.5  --   --   --   --   --   --   EOSABS 0.2  --   --   --   --   --   --  BASOSABS 0.0  --   --   --   --   --   --   < > = values in this interval not displayed.  Chemistries   Recent Labs Lab 02/04/16 1525  02/04/16 2334 02/05/16 0401 02/05/16 0858 02/06/16 0735 02/07/16 0740 02/08/16 0530  NA 134*  < > 129* 135  --  141 139 141  K 3.0*  < > 3.0* 3.2*  --  3.7 4.0 3.8  CL 95*  < > 100* 103  --  107 108 107  CO2 24  --  20* 22  --  '27 23 23  '$ GLUCOSE 312*  < > 388* 263*  --  132* 187* 118*  BUN 18  < > 18 16  --  6 <5* <5*  CREATININE 1.69*  < > 1.37* 1.16*  --  0.82 0.72 0.60    CALCIUM 9.1  --  7.9* 7.9*  --  7.9* 8.0* 8.1*  MG  --   --   --   --  1.6*  --   --   --   AST 17  --   --  10*  --  8* 13* 17  ALT 13*  --   --  10*  --  7* 9* 9*  ALKPHOS 119  --   --  86  --  78 86 88  BILITOT 1.1  --   --  0.8  --  0.6 0.5 0.5  < > = values in this interval not displayed. ------------------------------------------------------------------------------------------------------------------ No results for input(s): CHOL, HDL, LDLCALC, TRIG, CHOLHDL, LDLDIRECT in the last 72 hours.  No results found for: HGBA1C ------------------------------------------------------------------------------------------------------------------ No results for input(s): TSH, T4TOTAL, T3FREE, THYROIDAB in the last 72 hours.  Invalid input(s): FREET3 ------------------------------------------------------------------------------------------------------------------ No results for input(s): VITAMINB12, FOLATE, FERRITIN, TIBC, IRON, RETICCTPCT in the last 72 hours.  Coagulation profile  Recent Labs Lab 02/04/16 1525  INR 0.91   CBG (last 3)   Recent Labs  02/07/16 1223 02/07/16 1637 02/07/16 2109  GLUCAP 93 307* 282*     No results for input(s): DDIMER in the last 72 hours.  Cardiac Enzymes No results for input(s): CKMB, TROPONINI, MYOGLOBIN in the last 168 hours.  Invalid input(s): CK ------------------------------------------------------------------------------------------------------------------ No results found for: BNP  Inpatient Medications  Scheduled Meds: . darifenacin  7.5 mg Oral Daily  . feeding supplement (GLUCERNA SHAKE)  237 mL Oral TID BM  . feeding supplement (PRO-STAT SUGAR FREE 64)  30 mL Oral TID WC  . gabapentin  300 mg Oral BID  . heparin subcutaneous  5,000 Units Subcutaneous 3 times per day  . insulin aspart  0-5 Units Subcutaneous QHS  . insulin aspart  0-9 Units Subcutaneous TID WC  . insulin detemir  20 Units Subcutaneous QHS  .  mometasone-formoterol  2 puff Inhalation BID  . saccharomyces boulardii  250 mg Oral BID  . sodium chloride flush  3 mL Intravenous Q12H  . Vitamin D (Ergocalciferol)  50,000 Units Oral Q7 days   Continuous Infusions:   PRN Meds:.acetaminophen **OR** [DISCONTINUED] acetaminophen, HYDROcodone-acetaminophen, ketoconazole, [DISCONTINUED] ondansetron **OR** ondansetron (ZOFRAN) IV  Micro Results Recent Results (from the past 240 hour(s))  Gastrointestinal Panel by PCR , Stool     Status: None   Collection Time: 02/04/16  8:40 PM  Result Value Ref Range Status   Campylobacter species NOT DETECTED NOT DETECTED Final   Plesimonas shigelloides NOT DETECTED NOT DETECTED Final   Salmonella species NOT DETECTED NOT DETECTED  Final   Yersinia enterocolitica NOT DETECTED NOT DETECTED Final   Vibrio species NOT DETECTED NOT DETECTED Final   Vibrio cholerae NOT DETECTED NOT DETECTED Final   Enteroaggregative E coli (EAEC) NOT DETECTED NOT DETECTED Final   Enteropathogenic E coli (EPEC) NOT DETECTED NOT DETECTED Final   Enterotoxigenic E coli (ETEC) NOT DETECTED NOT DETECTED Final   Shiga like toxin producing E coli (STEC) NOT DETECTED NOT DETECTED Final   E. coli O157 NOT DETECTED NOT DETECTED Final   Shigella/Enteroinvasive E coli (EIEC) NOT DETECTED NOT DETECTED Final   Cryptosporidium NOT DETECTED NOT DETECTED Final   Cyclospora cayetanensis NOT DETECTED NOT DETECTED Final   Entamoeba histolytica NOT DETECTED NOT DETECTED Final   Giardia lamblia NOT DETECTED NOT DETECTED Final   Adenovirus F40/41 NOT DETECTED NOT DETECTED Final   Astrovirus NOT DETECTED NOT DETECTED Final   Norovirus GI/GII NOT DETECTED NOT DETECTED Final   Rotavirus A NOT DETECTED NOT DETECTED Final   Sapovirus (I, II, IV, and V) NOT DETECTED NOT DETECTED Final  C difficile quick scan w PCR reflex     Status: None   Collection Time: 02/04/16  8:40 PM  Result Value Ref Range Status   C Diff antigen NEGATIVE NEGATIVE Final    C Diff toxin NEGATIVE NEGATIVE Final   C Diff interpretation Negative for toxigenic C. difficile  Final    Radiology Reports Ct Abdomen Pelvis Wo Contrast  02/04/2016  CLINICAL DATA:  Lower abdominal pain with gas and bloating, diarrhea for 4 days, bright red blood in stool, rectal bleeding, hypertension, rheumatoid arthritis, lung cancer post radiation therapy, diabetes mellitus, former smoker EXAM: CT ABDOMEN AND PELVIS WITHOUT CONTRAST TECHNIQUE: Multidetector CT imaging of the abdomen and pelvis was performed following the standard protocol without IV contrast. Sagittal and coronal MPR images reconstructed from axial data set. COMPARISON:  PET-CT 11/14/2013, chest CT 11/30/2015 FINDINGS: Soft tissue density adjacent to RIGHT heart border likely reflects RIGHT middle lobe atelectasis, unchanged. Calcified granuloma LEFT lower lobe image 6 with additional tiny LEFT lower lobe nodule image 9, stable. Calcified granulomata within spleen. 9 mm cyst LEFT lobe liver image 11 unchanged. Questionable 7 mm subcapsular lesion lateral RIGHT lobe liver image 34. Lesion anterior lateral segment LEFT lobe liver 19 x 13 mm image 19 previously 14 x 9 mm. Gallbladder surgically absent. Remainder of liver, spleen, pancreas, kidneys, and adrenal glands normal. Wall thickening of ascending colon through distal transverse colon consistent with colitis. Associated significant hyperemia of transverse mesocolon. Cecum and distal colon appear unaffected. Stomach and small bowel loops normal appearance. Normal appendix Uterus surgically absent with nonvisualization of ovaries. No additional mass, adenopathy, free air free fluid. Bladder and ureters normal appearance. Scattered atherosclerotic calcifications. No acute osseous findings. IMPRESSION: Diffuse wall thickening of the ascending through distal transverse colon with associated hyperemia of the mesocolon compatible with colitis ; differential diagnosis includes infection and  inflammatory bowel disease, ischemia less likely with this distribution though not excluded. Interval increase in size of a lesion at the lateral segment LEFT lobe liver question hepatic metastasis. Electronically Signed   By: Lavonia Dana M.D.   On: 02/04/2016 16:47   Dg Ankle Complete Left  02/01/2016  CLINICAL DATA:  75 year old female fell in kitchen. Ankle pain. Initial encounter. EXAM: LEFT ANKLE COMPLETE - 3+ VIEW COMPARISON:  07/22/2014. FINDINGS: Oblique fracture of distal left fibula with slight separation of fracture fragments. Slight irregularity of the cuboid. If there were tenderness in this region, left foot  films recommended for further delineation. Plantar spur. IMPRESSION: Oblique fracture of distal left fibula with slight separation of fracture fragments. Slight irregularity of the cuboid. If there were tenderness in this region, left foot films recommended for further delineation. Electronically Signed   By: Genia Del M.D.   On: 02/01/2016 11:39   Dg Ankle Complete Right  02/01/2016  CLINICAL DATA:  [place] in kitchen this morning, patient states she was light headed when she fell, left ankle pain worse than right. Hx right ankle surgery x 2 by dr. Sullivan Lone: RIGHT ANKLE - COMPLETE 3+ VIEW COMPARISON:  08/01/2015 FINDINGS: Plate and screw fixation device within the distal right fibula. No acute fracture, subluxation or dislocation. Soft tissues are intact. IMPRESSION: No acute bony abnormality. Electronically Signed   By: Rolm Baptise M.D.   On: 02/01/2016 11:36   Dg Chest Port 1 View  02/04/2016  CLINICAL DATA:  Cough, hypotension and diarrhea. History of squamous cell carcinoma of the lung. EXAM: PORTABLE CHEST 1 VIEW COMPARISON:  CT of the chest on 11/30/2015 FINDINGS: There is chronic volume loss of the right lung with dense chronic atelectasis and consolidation of the right middle lobe. No edema, infiltrate or pneumothorax identified. No pleural effusions are seen. The heart size is  normal. IMPRESSION: Chronic right middle lobe atelectasis and consolidation. Electronically Signed   By: Aletta Edouard M.D.   On: 02/04/2016 22:02   Dg Foot Complete Left  02/01/2016  CLINICAL DATA:  75 year old female with ankle fracture. Foot pain. Post fall. Subsequent encounter. EXAM: LEFT FOOT - COMPLETE 3+ VIEW COMPARISON:  02/01/2016 ankle films. FINDINGS: Fracture of the distal left fibula as noted on ankle films. No other fracture or dislocation noted. Plantar spur. IMPRESSION: Fracture of the distal left fibula as noted on ankle films. No other fracture or dislocation noted. Electronically Signed   By: Genia Del M.D.   On: 02/01/2016 13:12    Time Spent in minutes  30   Vinnie Gombert K M.D on 02/08/2016 at 9:43 AM  Between 7am to 7pm - Pager - [phone]  After 7pm go to [email or web address] - password [facility]  [facility] -  Office  [phone]

## 2016-02-08 NOTE — Progress Notes (Signed)
Patient ID: Megan [name], female   DOB: July 15, 1941, 75 y.o.   MRN: [id number] Patient states that she is much improved from her GI symptoms. We will plan for open reduction internal fixation of her ankle fracture on Wednesday.

## 2016-02-09 ENCOUNTER — Encounter (HOSPITAL_COMMUNITY): Payer: Self-pay | Admitting: Gastroenterology

## 2016-02-09 DIAGNOSIS — K529 Noninfective gastroenteritis and colitis, unspecified: Secondary | ICD-10-CM | POA: Diagnosis not present

## 2016-02-09 LAB — COMPREHENSIVE METABOLIC PANEL
ALT: 12 U/L — ABNORMAL LOW (ref 14–54)
ANION GAP: 11 (ref 5–15)
AST: 18 U/L (ref 15–41)
Albumin: 2.3 g/dL — ABNORMAL LOW (ref 3.5–5.0)
Alkaline Phosphatase: 112 U/L (ref 38–126)
BILIRUBIN TOTAL: 1 mg/dL (ref 0.3–1.2)
BUN: 10 mg/dL (ref 6–20)
CALCIUM: 7.7 mg/dL — AB (ref 8.9–10.3)
CO2: 22 mmol/L (ref 22–32)
Chloride: 102 mmol/L (ref 101–111)
Creatinine, Ser: 0.89 mg/dL (ref 0.44–1.00)
Glucose, Bld: 521 mg/dL — ABNORMAL HIGH (ref 65–99)
POTASSIUM: 4.5 mmol/L (ref 3.5–5.1)
Sodium: 135 mmol/L (ref 135–145)
TOTAL PROTEIN: 4.7 g/dL — AB (ref 6.5–8.1)

## 2016-02-09 LAB — GLUCOSE, CAPILLARY
GLUCOSE-CAPILLARY: 416 mg/dL — AB (ref 65–99)
GLUCOSE-CAPILLARY: 418 mg/dL — AB (ref 65–99)
GLUCOSE-CAPILLARY: 225 mg/dL — AB (ref 65–99)
GLUCOSE-CAPILLARY: 214 mg/dL — AB (ref 65–99)
Glucose-Capillary: 482 mg/dL — ABNORMAL HIGH (ref 65–99)

## 2016-02-09 LAB — PROTIME-INR
INR: 1.02 (ref 0.00–1.49)
PROTHROMBIN TIME: 13.6 s (ref 11.6–15.2)

## 2016-02-09 LAB — CALCIUM, IONIZED: Calcium, Ionized, Serum: 5.1 mg/dL (ref 4.5–5.6)

## 2016-02-09 LAB — MRSA PCR SCREENING: MRSA BY PCR: NEGATIVE

## 2016-02-09 MED ORDER — INSULIN ASPART 100 UNIT/ML ~~LOC~~ SOLN
15.0000 [IU] | Freq: Once | SUBCUTANEOUS | Status: AC
  Administered 2016-02-09: 15 [IU] via SUBCUTANEOUS

## 2016-02-09 MED ORDER — CHLORHEXIDINE GLUCONATE 4 % EX LIQD
Freq: Once | CUTANEOUS | Status: AC
  Administered 2016-02-10: 04:00:00 via TOPICAL
  Filled 2016-02-09: qty 15

## 2016-02-09 MED ORDER — CHLORHEXIDINE GLUCONATE 4 % EX LIQD: 60.0000 mL | Freq: Once | CUTANEOUS | Status: DC

## 2016-02-09 MED ORDER — INSULIN DETEMIR 100 UNIT/ML ~~LOC~~ SOLN
40.0000 [IU] | Freq: Every day | SUBCUTANEOUS | Status: DC
  Filled 2016-02-09: qty 0.4

## 2016-02-09 MED ORDER — CEFAZOLIN SODIUM-DEXTROSE 2-4 GM/100ML-% IV SOLN
2.0000 g | INTRAVENOUS | Status: AC
  Administered 2016-02-10: 2 g via INTRAVENOUS
  Filled 2016-02-09: qty 100

## 2016-02-09 MED ORDER — INSULIN ASPART 100 UNIT/ML ~~LOC~~ SOLN
5.0000 [IU] | Freq: Three times a day (TID) | SUBCUTANEOUS | Status: DC
  Administered 2016-02-09 – 2016-02-12 (×8): 5 [IU] via SUBCUTANEOUS

## 2016-02-09 MED ORDER — INSULIN DETEMIR 100 UNIT/ML ~~LOC~~ SOLN
30.0000 [IU] | Freq: Every day | SUBCUTANEOUS | Status: DC
  Administered 2016-02-09: 30 [IU] via SUBCUTANEOUS
  Filled 2016-02-09 (×2): qty 0.3

## 2016-02-09 NOTE — Progress Notes (Signed)
Inpatient Diabetes Program Recommendations  AACE/ADA: New Consensus Statement on Inpatient Glycemic Control (2015)  Target Ranges:  Prepandial:   less than 140 mg/dL      Peak postprandial:   less than 180 mg/dL (1-2 hours)      Critically ill patients:  140 - 180 mg/dL   Results for SEVANNA, BALLENGEE (MRN [id number]) as of 02/09/2016 08:31  Ref. Range 02/08/2016 11:58 02/08/2016 21:20  Glucose-Capillary Latest Ref Range: 65-99 mg/dL 310 (H) 342 (H)   Results for SHANENA, PELLEGRINO (MRN [id number]) as of 02/09/2016 08:31  Ref. Range 02/08/2016 05:30 02/09/2016 05:24  Glucose Latest Ref Range: 65-99 mg/dL 118 (H) 521 (H)   Review of Glycemic Control  Diabetes history: DM 2 Outpatient Diabetes medications: Humalog 6-10 units TID (6 units breakfast, 6 units lunch, 10 units supper), Levemir 34 units QHS Current orders for Inpatient glycemic control: Levemir 30 units, Novolog Sensitive + HS  Inpatient Diabetes Program Recommendations: Insulin - Basal: Basal insulin orders changed yesterday, Patient did not get basal insulin dose yesterday due to dosing moving from HS to Daily. Lab glucose in 500's this am. Patient needs basal insulin ASAP this am.  Fasting lab glucose yest 118 mg/dl, basal insulin dose is appropriate, will probably need meal coverage instead of increase in basal since she takes Humalog 04-05-09 at home. Glucose increased into the 300's at meal times yesterday.  Thanks,  Tama Headings RN, MSN, [facility] Inpatient Diabetes Coordinator Team Pager [phone] (8a-5p)

## 2016-02-09 NOTE — Progress Notes (Signed)
Patient found to have CBG=482@ 8AM. Per MD order, patient received 15 units Novolog and 30 units Levemir. At 11:30 her CBG=416 and per MD order the patient received additional 15 units Novolog. At 13:30 o'clock, CBG was rechecked and was 418. Patient received 15 units Novolog per MD order and at 16:00 o'clock, CBG=225. Will continue to monitor.

## 2016-02-09 NOTE — Progress Notes (Signed)
TRH Progress Note                                                                                                                                                                                                                      Patient Demographics:    Megan [name], is a 75 y.o. female, DOB - 04-22-1941, XIP:[id number]  Admit date - 02/04/2016   Admitting Physician Bonnielee Haff, MD  Outpatient Primary MD for the patient is [name], MD  LOS - 5  Outpatient Specialists:   Chief Complaint  Patient presents with  . Hypotension  . Rectal Bleeding        Subjective:    Megan [name] today has, No headache, No chest pain, No abdominal pain - No Nausea, No new weakness tingling or numbness, No Cough - SOB. Improved diarrhea. Minimal left leg pain, awaits surgery for her left leg on 02/10/2016.   Assessment  & Plan :     1. Acute diffuse colitis with diarrhea. Diarrhea has improved, GI pathogen panel negative, H&H when accounted for dilution with IV fluids is stable. No further GI bleed. GI on board, currently on IV Cipro Flagyl and bowel rest, Had colonoscopy on 02/07/2016. Colonoscopy suggested most likely of infectious colitis, continue present care, symptoms much improved.   2. ARF due to dehydration with hypotension all caused by diarrhea and decreased oral intake along with ARB. Resolved with hydration. Discontinue ARB as she has recurrent episodes of syncope at home which sound like orthostatic episodes.  3. Fall with left distal fibula fracture. Placed in cast, Dr. Sharol Given to follow. Likely surgery on 02/10/2016, no weightbearing for now.  4. History of lung cancer. Currently off treatment, follow with oncology post discharge.  5. Multiple episodes of syncope. Most likely due to hypotension, hold ARB upon discharge, will add 30 day event monitor  and outpatient cardiology follow-up as well. Discussed with cardiologist.  6. Hypocalcemia. Mild, due to GI losses, was stable upon admission. Replaced and stable.   7. DM type II on Levimir, sliding scale pre-meal NovoLog, sugars high as Levemir dose was missed by the staff on 02/08/2016  CBG (last 3)   Recent Labs  02/08/16 1158 02/08/16 2120 02/09/16 0805  GLUCAP 310* 342* 482*     No results found for: HGBA1C   Code Status : Full  Family Communication  : None  Disposition Plan  : To be decided  Barriers For Discharge : GI evaluation  Consults  :  GI, orthopedics  Procedures  :   CT abdomen pelvis with diffuse colitis  Colonoscopy None for 07/20/2016 with colonic ulcers noted suggestive of most likely infection versus IBD, less likely ischemia.  Left distal fibula surgical correction due on 02/10/2016  DVT Prophylaxis  :  Heparin - SCDs    Lab Results  Component Value Date   PLT 198 02/08/2016    Antibiotics  :     Anti-infectives    Start     Dose/Rate Route Frequency Ordered Stop   02/05/16 0600  [MAR Hold]  ciprofloxacin (CIPRO) IVPB 400 mg  Status:  Discontinued     (MAR Hold since 02/07/16 1104)   400 mg 200 mL/hr over 60 Minutes Intravenous Every 12 hours 02/04/16 1943 02/07/16 1239   02/05/16 0200  [MAR Hold]  metroNIDAZOLE (FLAGYL) IVPB 500 mg  Status:  Discontinued     (MAR Hold since 02/07/16 1104)   500 mg 100 mL/hr over 60 Minutes Intravenous Every 8 hours 02/04/16 1943 02/07/16 1239   02/04/16 1800  ciprofloxacin (CIPRO) IVPB 400 mg  Status:  Discontinued     400 mg 200 mL/hr over 60 Minutes Intravenous  Once 02/04/16 1748 02/07/16 1311   02/04/16 1800  metroNIDAZOLE (FLAGYL) IVPB 500 mg     500 mg 100 mL/hr over 60 Minutes Intravenous  Once 02/04/16 1748 02/04/16 2100        Objective:   Filed Vitals:   02/08/16 1504 02/08/16 2203 02/08/16 2205 02/09/16 0511  BP: 132/91  117/60 124/62  Pulse: 101 102 101 98  Temp: 98.6 F (37 C)  98.4 F (36.9 C) 98.4 F (36.9 C) 98.3 F (36.8 C)  TempSrc: Oral   Oral  Resp: '16 18 18 17  '$ Height:      Weight:      SpO2: 99% 98% 98% 95%    Wt Readings from Last 3 Encounters:  02/04/16 70.353 kg (155 lb 1.6 oz)  02/04/16 63.957 kg (141 lb)  12/01/15 61.735 kg (136 lb 1.6 oz)     Intake/Output Summary (Last 24 hours) at 02/09/16 0958 Last data filed at 02/09/16 0900  Gross per 24 hour  Intake    840 ml  Output      0 ml  Net    840 ml     Physical Exam  Awake Alert, Oriented X 3, No new F.N deficits, Normal affect [place].AT,PERRAL Supple Neck,No JVD, No cervical lymphadenopathy appriciated.  Symmetrical Chest wall movement, Good air movement bilaterally, CTAB RRR,No Gallops,Rubs or new Murmurs, No Parasternal Heave +ve B.Sounds, Abd Soft, No tenderness, No organomegaly appriciated, No rebound - guarding or rigidity. No Cyanosis, Clubbing or edema, No new Rash or bruise, L leg in CAM boot/cast    Data Review:    CBC  Recent Labs Lab 02/04/16 2100 02/05/16 0401 02/06/16 0735 02/07/16 0740 02/08/16 0530  WBC 8.5 7.3 6.2 5.5 6.7  HGB 12.8 11.9* 10.9* 11.2* 11.5*  HCT 39.0 36.3 33.6* 34.9* 34.5*  PLT 216 177 188 190 198  MCV 89.4 89.0 90.3 89.9 89.6  MCH 29.4 29.2 29.3 28.9 29.9  MCHC 32.8 32.8 32.4 32.1 33.3  RDW 14.6 14.7 14.8 14.8 14.8    Chemistries   Recent Labs Lab 02/05/16 0401 02/05/16 0858 02/06/16 0735 02/07/16 0740 02/08/16 0530 02/09/16 0524  NA 135  --  141 139 141 135  K 3.2*  --  3.7 4.0 3.8 4.5  CL 103  --  107 108 107 102  CO2 22  --  '27 23 23 22  '$ GLUCOSE 263*  --  132* 187* 118* 521*  BUN 16  --  6 <5* <5* 10  CREATININE 1.16*  --  0.82 0.72 0.60 0.89  CALCIUM 7.9*  --  7.9* 8.0* 8.1* 7.7*  MG  --  1.6*  --   --   --   --   AST 10*  --  8* 13* 17 18  ALT 10*  --  7* 9* 9* 12*  ALKPHOS 86  --  78 86 88 112  BILITOT 0.8  --  0.6 0.5 0.5 1.0    ------------------------------------------------------------------------------------------------------------------ No results for input(s): CHOL, HDL, LDLCALC, TRIG, CHOLHDL, LDLDIRECT in the last 72 hours.  No results found for: HGBA1C ------------------------------------------------------------------------------------------------------------------ No results for input(s): TSH, T4TOTAL, T3FREE, THYROIDAB in the last 72 hours.  Invalid input(s): FREET3 ------------------------------------------------------------------------------------------------------------------ No results for input(s): VITAMINB12, FOLATE, FERRITIN, TIBC, IRON, RETICCTPCT in the last 72 hours.  Coagulation profile  Recent Labs Lab 02/04/16 1525 02/09/16 0524  INR 0.91 1.02   CBG (last 3)   Recent Labs  02/08/16 1158 02/08/16 2120 02/09/16 0805  GLUCAP 310* 342* 482*     No results for input(s): DDIMER in the last 72 hours.  Cardiac Enzymes No results for input(s): CKMB, TROPONINI, MYOGLOBIN in the last 168 hours.  Invalid input(s): CK ------------------------------------------------------------------------------------------------------------------ No results found for: BNP  Inpatient Medications  Scheduled Meds: . darifenacin  7.5 mg Oral Daily  . feeding supplement (GLUCERNA SHAKE)  237 mL Oral TID BM  . feeding supplement (PRO-STAT SUGAR FREE 64)  30 mL Oral TID WC  . gabapentin  300 mg Oral BID  . heparin subcutaneous  5,000 Units Subcutaneous 3 times per day  . insulin aspart  0-5 Units Subcutaneous QHS  . insulin aspart  0-9 Units Subcutaneous TID WC  . insulin aspart  5 Units Subcutaneous TID WC  . insulin detemir  30 Units Subcutaneous Daily  . mometasone-formoterol  2 puff Inhalation BID  . saccharomyces boulardii  250 mg Oral BID  . sodium chloride flush  3 mL Intravenous Q12H  . Vitamin D (Ergocalciferol)  50,000 Units Oral Q7 days   Continuous Infusions:   PRN  Meds:.acetaminophen **OR** [DISCONTINUED] acetaminophen, HYDROcodone-acetaminophen, ketoconazole, metoprolol, [DISCONTINUED] ondansetron **OR** ondansetron (ZOFRAN) IV  Micro Results Recent Results (from the past 240 hour(s))  Gastrointestinal Panel by PCR , Stool     Status: None   Collection Time: 02/04/16  8:40 PM  Result Value Ref Range Status   Campylobacter species NOT DETECTED NOT DETECTED Final   Plesimonas shigelloides NOT DETECTED NOT DETECTED Final   Salmonella species NOT DETECTED NOT DETECTED Final   Yersinia enterocolitica NOT DETECTED NOT DETECTED Final   Vibrio species NOT DETECTED NOT DETECTED Final   Vibrio cholerae NOT DETECTED NOT DETECTED Final   Enteroaggregative E coli (EAEC) NOT DETECTED NOT DETECTED Final   Enteropathogenic E coli (EPEC) NOT DETECTED NOT DETECTED Final   Enterotoxigenic E coli (ETEC) NOT DETECTED NOT DETECTED Final   Shiga like toxin producing E coli (STEC) NOT DETECTED NOT DETECTED Final   E. coli O157 NOT DETECTED NOT DETECTED Final   Shigella/Enteroinvasive E coli (EIEC) NOT DETECTED NOT DETECTED Final   Cryptosporidium NOT DETECTED NOT DETECTED Final   Cyclospora cayetanensis NOT DETECTED NOT DETECTED Final   Entamoeba histolytica NOT DETECTED NOT DETECTED Final   Giardia lamblia NOT DETECTED NOT DETECTED Final   Adenovirus F40/41 NOT DETECTED NOT DETECTED Final  Astrovirus NOT DETECTED NOT DETECTED Final   Norovirus GI/GII NOT DETECTED NOT DETECTED Final   Rotavirus A NOT DETECTED NOT DETECTED Final   Sapovirus (I, II, IV, and V) NOT DETECTED NOT DETECTED Final  C difficile quick scan w PCR reflex     Status: None   Collection Time: 02/04/16  8:40 PM  Result Value Ref Range Status   C Diff antigen NEGATIVE NEGATIVE Final   C Diff toxin NEGATIVE NEGATIVE Final   C Diff interpretation Negative for toxigenic C. difficile  Final    Radiology Reports Ct Abdomen Pelvis Wo Contrast  02/04/2016  CLINICAL DATA:  Lower abdominal pain with gas  and bloating, diarrhea for 4 days, bright red blood in stool, rectal bleeding, hypertension, rheumatoid arthritis, lung cancer post radiation therapy, diabetes mellitus, former smoker EXAM: CT ABDOMEN AND PELVIS WITHOUT CONTRAST TECHNIQUE: Multidetector CT imaging of the abdomen and pelvis was performed following the standard protocol without IV contrast. Sagittal and coronal MPR images reconstructed from axial data set. COMPARISON:  PET-CT 11/14/2013, chest CT 11/30/2015 FINDINGS: Soft tissue density adjacent to RIGHT heart border likely reflects RIGHT middle lobe atelectasis, unchanged. Calcified granuloma LEFT lower lobe image 6 with additional tiny LEFT lower lobe nodule image 9, stable. Calcified granulomata within spleen. 9 mm cyst LEFT lobe liver image 11 unchanged. Questionable 7 mm subcapsular lesion lateral RIGHT lobe liver image 34. Lesion anterior lateral segment LEFT lobe liver 19 x 13 mm image 19 previously 14 x 9 mm. Gallbladder surgically absent. Remainder of liver, spleen, pancreas, kidneys, and adrenal glands normal. Wall thickening of ascending colon through distal transverse colon consistent with colitis. Associated significant hyperemia of transverse mesocolon. Cecum and distal colon appear unaffected. Stomach and small bowel loops normal appearance. Normal appendix Uterus surgically absent with nonvisualization of ovaries. No additional mass, adenopathy, free air free fluid. Bladder and ureters normal appearance. Scattered atherosclerotic calcifications. No acute osseous findings. IMPRESSION: Diffuse wall thickening of the ascending through distal transverse colon with associated hyperemia of the mesocolon compatible with colitis ; differential diagnosis includes infection and inflammatory bowel disease, ischemia less likely with this distribution though not excluded. Interval increase in size of a lesion at the lateral segment LEFT lobe liver question hepatic metastasis. Electronically Signed    By: Lavonia Dana M.D.   On: 02/04/2016 16:47   Dg Ankle Complete Left  02/01/2016  CLINICAL DATA:  75 year old female fell in kitchen. Ankle pain. Initial encounter. EXAM: LEFT ANKLE COMPLETE - 3+ VIEW COMPARISON:  07/22/2014. FINDINGS: Oblique fracture of distal left fibula with slight separation of fracture fragments. Slight irregularity of the cuboid. If there were tenderness in this region, left foot films recommended for further delineation. Plantar spur. IMPRESSION: Oblique fracture of distal left fibula with slight separation of fracture fragments. Slight irregularity of the cuboid. If there were tenderness in this region, left foot films recommended for further delineation. Electronically Signed   By: Genia Del M.D.   On: 02/01/2016 11:39   Dg Ankle Complete Right  02/01/2016  CLINICAL DATA:  [place] in kitchen this morning, patient states she was light headed when she fell, left ankle pain worse than right. Hx right ankle surgery x 2 by dr. Sullivan Lone: RIGHT ANKLE - COMPLETE 3+ VIEW COMPARISON:  08/01/2015 FINDINGS: Plate and screw fixation device within the distal right fibula. No acute fracture, subluxation or dislocation. Soft tissues are intact. IMPRESSION: No acute bony abnormality. Electronically Signed   By: Rolm Baptise M.D.   On:  02/01/2016 11:36   Dg Chest Port 1 View  02/04/2016  CLINICAL DATA:  Cough, hypotension and diarrhea. History of squamous cell carcinoma of the lung. EXAM: PORTABLE CHEST 1 VIEW COMPARISON:  CT of the chest on 11/30/2015 FINDINGS: There is chronic volume loss of the right lung with dense chronic atelectasis and consolidation of the right middle lobe. No edema, infiltrate or pneumothorax identified. No pleural effusions are seen. The heart size is normal. IMPRESSION: Chronic right middle lobe atelectasis and consolidation. Electronically Signed   By: Aletta Edouard M.D.   On: 02/04/2016 22:02   Dg Foot Complete Left  02/01/2016  CLINICAL DATA:  75 year old female  with ankle fracture. Foot pain. Post fall. Subsequent encounter. EXAM: LEFT FOOT - COMPLETE 3+ VIEW COMPARISON:  02/01/2016 ankle films. FINDINGS: Fracture of the distal left fibula as noted on ankle films. No other fracture or dislocation noted. Plantar spur. IMPRESSION: Fracture of the distal left fibula as noted on ankle films. No other fracture or dislocation noted. Electronically Signed   By: Genia Del M.D.   On: 02/01/2016 13:12    Time Spent in minutes  30   SINGH,PRASHANT K M.D on 02/09/2016 at 9:58 AM  Between 7am to 7pm - Pager - [phone]  After 7pm go to [email or web address] - password [facility]  [facility] -  Office  [phone]

## 2016-02-10 ENCOUNTER — Other Ambulatory Visit: Payer: Self-pay

## 2016-02-10 ENCOUNTER — Observation Stay (HOSPITAL_COMMUNITY): Payer: Medicare Other | Admitting: Anesthesiology

## 2016-02-10 ENCOUNTER — Encounter (HOSPITAL_COMMUNITY)
Admission: EM | Disposition: A | Discharge status: Skilled Nursing Facility | Payer: Self-pay | Source: Home / Self Care | Attending: Emergency Medicine

## 2016-02-10 ENCOUNTER — Encounter (HOSPITAL_COMMUNITY): Payer: Self-pay | Admitting: Anesthesiology

## 2016-02-10 ENCOUNTER — Telehealth: Payer: Self-pay

## 2016-02-10 DIAGNOSIS — K633 Ulcer of intestine: Secondary | ICD-10-CM | POA: Diagnosis not present

## 2016-02-10 DIAGNOSIS — S82892A Other fracture of left lower leg, initial encounter for closed fracture: Secondary | ICD-10-CM | POA: Diagnosis not present

## 2016-02-10 DIAGNOSIS — K529 Noninfective gastroenteritis and colitis, unspecified: Secondary | ICD-10-CM | POA: Diagnosis not present

## 2016-02-10 DIAGNOSIS — K648 Other hemorrhoids: Secondary | ICD-10-CM | POA: Diagnosis not present

## 2016-02-10 HISTORY — PX: ORIF ANKLE FRACTURE: SHX5408

## 2016-02-10 LAB — GLUCOSE, CAPILLARY
GLUCOSE-CAPILLARY: 152 mg/dL — AB (ref 65–99)
GLUCOSE-CAPILLARY: 269 mg/dL — AB (ref 65–99)
Glucose-Capillary: 356 mg/dL — ABNORMAL HIGH (ref 65–99)
Glucose-Capillary: 343 mg/dL — ABNORMAL HIGH (ref 65–99)
Glucose-Capillary: 185 mg/dL — ABNORMAL HIGH (ref 65–99)
Glucose-Capillary: 165 mg/dL — ABNORMAL HIGH (ref 65–99)
Glucose-Capillary: 357 mg/dL — ABNORMAL HIGH (ref 65–99)

## 2016-02-10 SURGERY — OPEN REDUCTION INTERNAL FIXATION (ORIF) ANKLE FRACTURE
Anesthesia: General | Laterality: Left

## 2016-02-10 MED ORDER — MIDAZOLAM HCL 2 MG/2ML IJ SOLN
INTRAMUSCULAR | Status: AC
  Filled 2016-02-10: qty 2

## 2016-02-10 MED ORDER — MIDAZOLAM HCL 2 MG/2ML IJ SOLN
INTRAMUSCULAR | Status: AC
  Administered 2016-02-10: 1 mg via INTRAVENOUS
  Filled 2016-02-10: qty 2

## 2016-02-10 MED ORDER — MESALAMINE 800 MG PO TBEC: 2400.0000 mg | DELAYED_RELEASE_TABLET | Freq: Two times a day (BID) | ORAL | Status: DC

## 2016-02-10 MED ORDER — CEFAZOLIN SODIUM 1-5 GM-% IV SOLN
1.0000 g | Freq: Four times a day (QID) | INTRAVENOUS | Status: AC
  Administered 2016-02-10 – 2016-02-11 (×3): 1 g via INTRAVENOUS
  Filled 2016-02-10 (×3): qty 50

## 2016-02-10 MED ORDER — INSULIN DETEMIR 100 UNIT/ML ~~LOC~~ SOLN
5.0000 [IU] | Freq: Once | SUBCUTANEOUS | Status: DC
  Filled 2016-02-10: qty 0.05

## 2016-02-10 MED ORDER — SODIUM CHLORIDE 0.9 % IV SOLN
INTRAVENOUS | Status: DC
  Administered 2016-02-10: 1000 mL via INTRAVENOUS

## 2016-02-10 MED ORDER — ARTIFICIAL TEARS OP OINT
TOPICAL_OINTMENT | OPHTHALMIC | Status: AC
  Filled 2016-02-10: qty 3.5

## 2016-02-10 MED ORDER — MIDAZOLAM HCL 2 MG/2ML IJ SOLN
1.0000 mg | Freq: Once | INTRAMUSCULAR | Status: AC
  Administered 2016-02-10: 1 mg via INTRAVENOUS

## 2016-02-10 MED ORDER — METOCLOPRAMIDE HCL 5 MG/ML IJ SOLN: 5.0000 mg | Freq: Three times a day (TID) | INTRAMUSCULAR | Status: DC | PRN

## 2016-02-10 MED ORDER — LIDOCAINE HCL (CARDIAC) 20 MG/ML IV SOLN
INTRAVENOUS | Status: DC | PRN
  Administered 2016-02-10: 20 mg via INTRAVENOUS

## 2016-02-10 MED ORDER — ROCURONIUM BROMIDE 50 MG/5ML IV SOLN
INTRAVENOUS | Status: AC
  Filled 2016-02-10: qty 1

## 2016-02-10 MED ORDER — LACTATED RINGERS IV SOLN
INTRAVENOUS | Status: DC
  Administered 2016-02-10 (×2): via INTRAVENOUS

## 2016-02-10 MED ORDER — MORPHINE SULFATE (PF) 2 MG/ML IV SOLN
2.0000 mg | INTRAVENOUS | Status: DC | PRN
  Administered 2016-02-11 (×3): 2 mg via INTRAVENOUS
  Filled 2016-02-10 (×3): qty 1

## 2016-02-10 MED ORDER — INSULIN DETEMIR 100 UNIT/ML ~~LOC~~ SOLN
25.0000 [IU] | Freq: Two times a day (BID) | SUBCUTANEOUS | Status: DC
  Administered 2016-02-10: 25 [IU] via SUBCUTANEOUS
  Filled 2016-02-10 (×3): qty 0.25

## 2016-02-10 MED ORDER — OXYCODONE HCL 5 MG PO TABS: 5.0000 mg | ORAL_TABLET | ORAL | Status: DC | PRN

## 2016-02-10 MED ORDER — PROPOFOL 10 MG/ML IV BOLUS
INTRAVENOUS | Status: DC | PRN
  Administered 2016-02-10: 150 mg via INTRAVENOUS

## 2016-02-10 MED ORDER — ONDANSETRON HCL 4 MG/2ML IJ SOLN: 4.0000 mg | Freq: Four times a day (QID) | INTRAMUSCULAR | Status: DC | PRN

## 2016-02-10 MED ORDER — ACETAMINOPHEN 650 MG RE SUPP: 650.0000 mg | Freq: Four times a day (QID) | RECTAL | Status: DC | PRN

## 2016-02-10 MED ORDER — SUCCINYLCHOLINE CHLORIDE 20 MG/ML IJ SOLN
INTRAMUSCULAR | Status: AC
  Filled 2016-02-10: qty 1

## 2016-02-10 MED ORDER — EPHEDRINE SULFATE 50 MG/ML IJ SOLN
INTRAMUSCULAR | Status: AC
  Filled 2016-02-10: qty 1

## 2016-02-10 MED ORDER — ONDANSETRON HCL 4 MG PO TABS: 4.0000 mg | ORAL_TABLET | Freq: Four times a day (QID) | ORAL | Status: DC | PRN

## 2016-02-10 MED ORDER — ONDANSETRON HCL 4 MG/2ML IJ SOLN
INTRAMUSCULAR | Status: AC
  Filled 2016-02-10: qty 2

## 2016-02-10 MED ORDER — METHOCARBAMOL 1000 MG/10ML IJ SOLN: 500.0000 mg | Freq: Four times a day (QID) | INTRAVENOUS | Status: DC | PRN

## 2016-02-10 MED ORDER — INSULIN ASPART 100 UNIT/ML ~~LOC~~ SOLN
0.0000 [IU] | SUBCUTANEOUS | Status: DC
  Administered 2016-02-10: 15 [IU] via SUBCUTANEOUS

## 2016-02-10 MED ORDER — FENTANYL CITRATE (PF) 100 MCG/2ML IJ SOLN
INTRAMUSCULAR | Status: DC | PRN
  Administered 2016-02-10: 50 ug via INTRAVENOUS

## 2016-02-10 MED ORDER — FENTANYL CITRATE (PF) 100 MCG/2ML IJ SOLN: 25.0000 ug | INTRAMUSCULAR | Status: DC | PRN

## 2016-02-10 MED ORDER — ACETAMINOPHEN 325 MG PO TABS: 650.0000 mg | ORAL_TABLET | Freq: Four times a day (QID) | ORAL | Status: DC | PRN

## 2016-02-10 MED ORDER — FENTANYL CITRATE (PF) 100 MCG/2ML IJ SOLN
INTRAMUSCULAR | Status: AC
  Filled 2016-02-10: qty 2

## 2016-02-10 MED ORDER — MIDAZOLAM HCL 2 MG/2ML IJ SOLN
1.0000 mg | Freq: Once | INTRAMUSCULAR | Status: AC
  Administered 2016-02-10: 2 mg via INTRAVENOUS

## 2016-02-10 MED ORDER — SODIUM CHLORIDE 0.9 % IJ SOLN
INTRAMUSCULAR | Status: AC
Start: 2016-02-10 — End: 2016-02-10
  Filled 2016-02-10: qty 10

## 2016-02-10 MED ORDER — SODIUM CHLORIDE 0.9 % IV SOLN
INTRAVENOUS | Status: DC
  Administered 2016-02-10: 250 mL via INTRAVENOUS

## 2016-02-10 MED ORDER — LIDOCAINE HCL (CARDIAC) 20 MG/ML IV SOLN
INTRAVENOUS | Status: AC
  Filled 2016-02-10: qty 5

## 2016-02-10 MED ORDER — INSULIN DETEMIR 100 UNIT/ML ~~LOC~~ SOLN
20.0000 [IU] | Freq: Two times a day (BID) | SUBCUTANEOUS | Status: DC
  Administered 2016-02-10: 20 [IU] via SUBCUTANEOUS
  Filled 2016-02-10 (×2): qty 0.2

## 2016-02-10 MED ORDER — ROPIVACAINE HCL 5 MG/ML IJ SOLN
INTRAMUSCULAR | Status: DC | PRN
  Administered 2016-02-10: 25 mL via PERINEURAL

## 2016-02-10 MED ORDER — PROPOFOL 10 MG/ML IV BOLUS
INTRAVENOUS | Status: AC
  Filled 2016-02-10: qty 20

## 2016-02-10 MED ORDER — METOCLOPRAMIDE HCL 5 MG PO TABS: 5.0000 mg | ORAL_TABLET | Freq: Three times a day (TID) | ORAL | Status: DC | PRN

## 2016-02-10 MED ORDER — FENTANYL CITRATE (PF) 100 MCG/2ML IJ SOLN
50.0000 ug | Freq: Once | INTRAMUSCULAR | Status: AC
  Administered 2016-02-10: 50 ug via INTRAVENOUS

## 2016-02-10 MED ORDER — INSULIN ASPART 100 UNIT/ML ~~LOC~~ SOLN
0.0000 [IU] | Freq: Every day | SUBCUTANEOUS | Status: DC
  Administered 2016-02-10: 5 [IU] via SUBCUTANEOUS
  Administered 2016-02-11: 2 [IU] via SUBCUTANEOUS

## 2016-02-10 MED ORDER — METHOCARBAMOL 500 MG PO TABS: 500.0000 mg | ORAL_TABLET | Freq: Four times a day (QID) | ORAL | Status: DC | PRN

## 2016-02-10 MED ORDER — FENTANYL CITRATE (PF) 250 MCG/5ML IJ SOLN
INTRAMUSCULAR | Status: AC
  Filled 2016-02-10: qty 5

## 2016-02-10 MED ORDER — 0.9 % SODIUM CHLORIDE (POUR BTL) OPTIME
TOPICAL | Status: DC | PRN
  Administered 2016-02-10: 1000 mL

## 2016-02-10 MED ORDER — INSULIN ASPART 100 UNIT/ML ~~LOC~~ SOLN
0.0000 [IU] | Freq: Three times a day (TID) | SUBCUTANEOUS | Status: DC
  Administered 2016-02-10 – 2016-02-12 (×3): 8 [IU] via SUBCUTANEOUS

## 2016-02-10 SURGICAL SUPPLY — 48 items
BANDAGE ESMARK 6X9 LF (GAUZE/BANDAGES/DRESSINGS) IMPLANT
BIT DRILL 2.5X110 QC LCP DISP (BIT) ×2 IMPLANT
BNDG CMPR 9X6 STRL LF SNTH (GAUZE/BANDAGES/DRESSINGS)
BNDG COHESIVE 4X5 TAN STRL (GAUZE/BANDAGES/DRESSINGS) ×3 IMPLANT
BNDG ESMARK 6X9 LF (GAUZE/BANDAGES/DRESSINGS)
BNDG GAUZE ELAST 4 BULKY (GAUZE/BANDAGES/DRESSINGS) ×3 IMPLANT
COVER SURGICAL LIGHT HANDLE (MISCELLANEOUS) ×6 IMPLANT
CUFF TOURNIQUET SINGLE 34IN LL (TOURNIQUET CUFF) IMPLANT
CUFF TOURNIQUET SINGLE 44IN (TOURNIQUET CUFF) IMPLANT
DRAPE INCISE IOBAN 66X45 STRL (DRAPES) ×3 IMPLANT
DRAPE OEC MINIVIEW 54X84 (DRAPES) IMPLANT
DRAPE PROXIMA HALF (DRAPES) ×3 IMPLANT
DRAPE U-SHAPE 47X51 STRL (DRAPES) ×3 IMPLANT
DRSG ADAPTIC 3X8 NADH LF (GAUZE/BANDAGES/DRESSINGS) ×3 IMPLANT
DRSG PAD ABDOMINAL 8X10 ST (GAUZE/BANDAGES/DRESSINGS) ×3 IMPLANT
DURAPREP 26ML APPLICATOR (WOUND CARE) ×3 IMPLANT
ELECT REM PT RETURN 9FT ADLT (ELECTROSURGICAL) ×3
ELECTRODE REM PT RTRN 9FT ADLT (ELECTROSURGICAL) ×1 IMPLANT
GAUZE SPONGE 4X4 12PLY STRL (GAUZE/BANDAGES/DRESSINGS) ×3 IMPLANT
GLOVE BIOGEL PI IND STRL 9 (GLOVE) ×1 IMPLANT
GLOVE BIOGEL PI INDICATOR 9 (GLOVE) ×2
GLOVE SURG ORTHO 9.0 STRL STRW (GLOVE) ×3 IMPLANT
GOWN STRL REUS W/ TWL XL LVL3 (GOWN DISPOSABLE) ×3 IMPLANT
GOWN STRL REUS W/TWL XL LVL3 (GOWN DISPOSABLE) ×9
KIT BASIN OR (CUSTOM PROCEDURE TRAY) ×3 IMPLANT
KIT ROOM TURNOVER OR (KITS) ×3 IMPLANT
MANIFOLD NEPTUNE II (INSTRUMENTS) ×3 IMPLANT
NS IRRIG 1000ML POUR BTL (IV SOLUTION) ×3 IMPLANT
PACK ORTHO EXTREMITY (CUSTOM PROCEDURE TRAY) ×3 IMPLANT
PAD ARMBOARD 7.5X6 YLW CONV (MISCELLANEOUS) ×6 IMPLANT
PLATE LCP 3.5 1/3 TUB 7HX81 (Plate) ×2 IMPLANT
SCREW CORTEX 3.5 12MM (Screw) ×4 IMPLANT
SCREW CORTEX 3.5 14MM (Screw) ×2 IMPLANT
SCREW LOCK CORT ST 3.5X12 (Screw) IMPLANT
SCREW LOCK CORT ST 3.5X14 (Screw) IMPLANT
SCREW LOCK T15 FT 14X3.5X2.9X (Screw) IMPLANT
SCREW LOCKING 3.5X14 (Screw) ×6 IMPLANT
SPONGE LAP 18X18 X RAY DECT (DISPOSABLE) ×3 IMPLANT
STAPLER VISISTAT 35W (STAPLE) IMPLANT
SUCTION FRAZIER HANDLE 10FR (MISCELLANEOUS) ×2
SUCTION TUBE FRAZIER 10FR DISP (MISCELLANEOUS) ×1 IMPLANT
SUT ETHILON 2 0 PSLX (SUTURE) IMPLANT
SUT VIC AB 2-0 CT1 27 (SUTURE) ×6
SUT VIC AB 2-0 CT1 TAPERPNT 27 (SUTURE) ×2 IMPLANT
TOWEL OR 17X24 6PK STRL BLUE (TOWEL DISPOSABLE) ×3 IMPLANT
TOWEL OR 17X26 10 PK STRL BLUE (TOWEL DISPOSABLE) ×3 IMPLANT
TUBE CONNECTING 12'X1/4 (SUCTIONS) ×1
TUBE CONNECTING 12X1/4 (SUCTIONS) ×2 IMPLANT

## 2016-02-10 NOTE — H&P (View-Only) (Signed)
Patient ID: Megan [name], female   DOB: 14-Oct-1941, 75 y.o.   MRN: [id number] Patient states that she is much improved from her GI symptoms. We will plan for open reduction internal fixation of her ankle fracture on Wednesday.

## 2016-02-10 NOTE — Anesthesia Procedure Notes (Addendum)
Anesthesia Regional Block:  Popliteal block  Pre-Anesthetic Checklist: ,, timeout performed, Correct Patient, Correct Site, Correct Laterality, Correct Procedure, Correct Position, site marked, Risks and benefits discussed,  Surgical consent,  Pre-op evaluation,  At surgeon's request and post-op pain management  Laterality: Left  Prep: chloraprep       Needles:  Injection technique: Single-shot  Needle Type: Echogenic Stimulator Needle     Needle Length: 9cm 9 cm Needle Gauge: 21 and 21 G    Additional Needles:  Procedures: ultrasound guided (picture in chart) and nerve stimulator Popliteal block Narrative:  Start time: 02/10/2016 10:20 AM End time: 02/10/2016 10:26 AM Injection made incrementally with aspirations every 5 mL.  Performed by: Personally  Anesthesiologist: Suzette Battiest   Performed by: Ebbie Latus E    Procedure Name: LMA Insertion Date/Time: 02/10/2016 11:01 AM Performed by: Scheryl Darter Pre-anesthesia Checklist: Patient identified, Emergency Drugs available, Suction available, Patient being monitored and Timeout performed Patient Re-evaluated:Patient Re-evaluated prior to inductionOxygen Delivery Method: Circle system utilized Preoxygenation: Pre-oxygenation with 100% oxygen Intubation Type: IV induction Ventilation: Mask ventilation without difficulty LMA: LMA inserted LMA Size: 4.0 Number of attempts: 1

## 2016-02-10 NOTE — Progress Notes (Addendum)
Patient back from OR. LLE - dressing intact covered with Ace Wrap and ortho  boot.  Alert and oriented x 4, no acute distress noted, no complaints. CBG=152 - per MD order no coverage at this time. Restart ACHS with night coverage '@16'$ ;;. Will continue to monitor.

## 2016-02-10 NOTE — Op Note (Signed)
02/04/2016 - 02/10/2016  11:37 AM  PATIENT:  Megan [name]    PRE-OPERATIVE DIAGNOSIS:  Left Ankle Fracture  POST-OPERATIVE DIAGNOSIS:  Same  PROCEDURE:  OPEN REDUCTION INTERNAL FIXATION (ORIF) ANKLE FRACTURE  SURGEON:  Newt Minion, MD  PHYSICIAN ASSISTANT:None ANESTHESIA:   General  PREOPERATIVE INDICATIONS:  Megan [name] is a  75 y.o. female with a diagnosis of Left Ankle Fracture who failed conservative measures and elected for surgical management.    The risks benefits and alternatives were discussed with the patient preoperatively including but not limited to the risks of infection, bleeding, nerve injury, cardiopulmonary complications, the need for revision surgery, among others, and the patient was willing to proceed.  OPERATIVE IMPLANTS: 7-hole one third tubular plate  OPERATIVE FINDINGS: Comminution at the fracture site would not allow for interfragmentary screw, significant osteoporotic bone  OPERATIVE PROCEDURE: Patient was brought to operating room after undergoing a popliteal block she then underwent a general anesthetic. After adequate levels anesthesia were obtained agents left lower extremity was prepped using DuraPrep draped into a sterile field. A timeout was called. A lateral incision was made over the fibula this was carried sharply down to bone. Subperiosteal dissection was used to freshen the fracture site. The fracture was reduced a interfrag screw was attempted however patient had significant osteoporotic bone and this was not providing any additional stable fixation. A anti-glide plate was placed posterior laterally 3 compression screws proximally and 2 locking screws distally. C-arm floss be verified alignment. The wound was irrigated with normal saline. Subcutaneous is closed using 0 Vicryl skin was closed using 2-0 nylon. A sterile compressive dressing was applied patient was extubated taken the PACU in stable condition.

## 2016-02-10 NOTE — Transfer of Care (Signed)
Immediate Anesthesia Transfer of Care Note  Patient: Megan [name]  Procedure(s) Performed: Procedure(s): OPEN REDUCTION INTERNAL FIXATION (ORIF) ANKLE FRACTURE (Left)  Patient Location: PACU  Anesthesia Type:General and Regional  Level of Consciousness: awake, alert , oriented and sedated  Airway & Oxygen Therapy: Patient Spontanous Breathing and Patient connected to nasal cannula oxygen  Post-op Assessment: Report given to RN, Post -op Vital signs reviewed and stable and Patient moving all extremities  Post vital signs: Reviewed and stable  Last Vitals:  Filed Vitals:   02/10/16 1040 02/10/16 1044  BP: 127/72 127/72  Pulse: 95 95  Temp:    Resp: 21 17    Complications: No apparent anesthesia complications

## 2016-02-10 NOTE — Telephone Encounter (Signed)
Called [employer] and initiated the prior auth needed for Asacol. ( Asacol '800mg'$  2 po T.I. D ) awaiting review.

## 2016-02-10 NOTE — Progress Notes (Signed)
TRH Progress Note                                                                                                                                                                                                                      Patient Demographics:    Megan [name], is a 75 y.o. female, DOB - 1941-06-13, BUL:[id number]  Admit date - 02/04/2016   Admitting Physician Bonnielee Haff, MD  Outpatient Primary MD for the patient is [name], MD  LOS - 6  Outpatient Specialists:   Chief Complaint  Patient presents with  . Hypotension  . Rectal Bleeding        Subjective:    Megan [name] today has, No headache, No chest pain, No abdominal pain - No Nausea, No new weakness tingling or numbness, No Cough - SOB. Improved diarrhea. Minimal left leg pain, awaits surgery for her left leg on 02/10/2016.   Assessment  & Plan :     1. Acute diffuse colitis with diarrhea. Diarrhea has improved, GI pathogen panel negative, H&H when accounted for dilution with IV fluids is stable. No further GI bleed. GI on board, finished IV Cipro Flagyl and bowel rest, Had colonoscopy on 02/07/2016. Colonoscopy suggested most likely of infectious colitis, continue present care, symptoms resolved.   2. ARF due to dehydration with hypotension all caused by diarrhea and decreased oral intake along with ARB. Resolved with hydration. Discontinue ARB as she has recurrent episodes of syncope at home which sound like orthostatic episodes.  3. Fall with left distal fibula fracture. Placed in cast, Dr. Sharol Given to follow. Surgery on 02/10/2016, no weightbearing for now. Will need SNF.  4. History of lung cancer. Currently off treatment, follow with oncology post discharge.  5. Multiple episodes of syncope. Most likely due to hypotension, hold ARB upon discharge, will add 30 day event monitor  and outpatient cardiology follow-up as well. Discussed with cardiologist.  6. Hypocalcemia. Mild, due to GI losses, was stable upon admission. Replaced and stable.   7. DM type II on Levimir, sliding scale + pre-meal NovoLog, Levemir now increased to 25 twice a day she takes 34 units at home, increased sliding scale, will make it run on the slightly higher side as she is nothing by mouth and then shoot for more tighter control in the evening.  CBG (last 3)   Recent Labs  02/09/16 2125 02/10/16 0809 02/10/16 0923  GLUCAP 214* 356* 343*     No results found for: HGBA1C  Code Status : Full  Family Communication  : None  Disposition Plan  : To be decided  Barriers For Discharge : GI evaluation  Consults  :  GI, orthopedics  Procedures  :   CT abdomen pelvis with diffuse colitis  Colonoscopy None for 07/20/2016 with colonic ulcers noted suggestive of most likely infection versus IBD, less likely ischemia.  Left distal fibula surgical correction due on 02/10/2016  DVT Prophylaxis  :  Heparin - SCDs    Lab Results  Component Value Date   PLT 198 02/08/2016    Antibiotics  :     Anti-infectives    Start     Dose/Rate Route Frequency Ordered Stop   02/10/16 1000  ceFAZolin (ANCEF) IVPB 2g/100 mL premix     2 g 200 mL/hr over 30 Minutes Intravenous To ShortStay Surgical 02/09/16 1200 02/11/16 1000   02/05/16 0600  [MAR Hold]  ciprofloxacin (CIPRO) IVPB 400 mg  Status:  Discontinued     (MAR Hold since 02/07/16 1104)   400 mg 200 mL/hr over 60 Minutes Intravenous Every 12 hours 02/04/16 1943 02/07/16 1239   02/05/16 0200  [MAR Hold]  metroNIDAZOLE (FLAGYL) IVPB 500 mg  Status:  Discontinued     (MAR Hold since 02/07/16 1104)   500 mg 100 mL/hr over 60 Minutes Intravenous Every 8 hours 02/04/16 1943 02/07/16 1239   02/04/16 1800  ciprofloxacin (CIPRO) IVPB 400 mg  Status:  Discontinued     400 mg 200 mL/hr over 60 Minutes Intravenous  Once 02/04/16 1748 02/07/16 1311     02/04/16 1800  metroNIDAZOLE (FLAGYL) IVPB 500 mg     500 mg 100 mL/hr over 60 Minutes Intravenous  Once 02/04/16 1748 02/04/16 2100        Objective:   Filed Vitals:   02/09/16 0511 02/09/16 1329 02/09/16 2207 02/10/16 0609  BP: 124/62 114/49 109/52 127/71  Pulse: 98 107 100 91  Temp: 98.3 F (36.8 C) 98.1 F (36.7 C) 97.4 F (36.3 C) 97.4 F (36.3 C)  TempSrc: Oral Oral Oral   Resp: '17 18 18 18  '$ Height:      Weight:      SpO2: 95% 99% 98% 99%    Wt Readings from Last 3 Encounters:  02/04/16 70.353 kg (155 lb 1.6 oz)  02/04/16 63.957 kg (141 lb)  12/01/15 61.735 kg (136 lb 1.6 oz)     Intake/Output Summary (Last 24 hours) at 02/10/16 0927 Last data filed at 02/10/16 0900  Gross per 24 hour  Intake    360 ml  Output      0 ml  Net    360 ml     Physical Exam  Awake Alert, Oriented X 3, No new F.N deficits, Normal affect [place].AT,PERRAL Supple Neck,No JVD, No cervical lymphadenopathy appriciated.  Symmetrical Chest wall movement, Good air movement bilaterally, CTAB RRR,No Gallops,Rubs or new Murmurs, No Parasternal Heave +ve B.Sounds, Abd Soft, No tenderness, No organomegaly appriciated, No rebound - guarding or rigidity. No Cyanosis, Clubbing or edema, No new Rash or bruise, L leg in CAM boot/cast    Data Review:    CBC  Recent Labs Lab 02/04/16 2100 02/05/16 0401 02/06/16 0735 02/07/16 0740 02/08/16 0530  WBC 8.5 7.3 6.2 5.5 6.7  HGB 12.8 11.9* 10.9* 11.2* 11.5*  HCT 39.0 36.3 33.6* 34.9* 34.5*  PLT 216 177 188 190 198  MCV 89.4 89.0 90.3 89.9 89.6  MCH 29.4 29.2 29.3 28.9 29.9  MCHC 32.8 32.8 32.4  32.1 33.3  RDW 14.6 14.7 14.8 14.8 14.8    Chemistries   Recent Labs Lab 02/05/16 0401 02/05/16 0858 02/06/16 0735 02/07/16 0740 02/08/16 0530 02/09/16 0524  NA 135  --  141 139 141 135  K 3.2*  --  3.7 4.0 3.8 4.5  CL 103  --  107 108 107 102  CO2 22  --  '27 23 23 22  '$ GLUCOSE 263*  --  132* 187* 118* 521*  BUN 16  --  6 <5* <5* 10   CREATININE 1.16*  --  0.82 0.72 0.60 0.89  CALCIUM 7.9*  --  7.9* 8.0* 8.1* 7.7*  MG  --  1.6*  --   --   --   --   AST 10*  --  8* 13* 17 18  ALT 10*  --  7* 9* 9* 12*  ALKPHOS 86  --  78 86 88 112  BILITOT 0.8  --  0.6 0.5 0.5 1.0   ------------------------------------------------------------------------------------------------------------------ No results for input(s): CHOL, HDL, LDLCALC, TRIG, CHOLHDL, LDLDIRECT in the last 72 hours.  No results found for: HGBA1C ------------------------------------------------------------------------------------------------------------------ No results for input(s): TSH, T4TOTAL, T3FREE, THYROIDAB in the last 72 hours.  Invalid input(s): FREET3 ------------------------------------------------------------------------------------------------------------------ No results for input(s): VITAMINB12, FOLATE, FERRITIN, TIBC, IRON, RETICCTPCT in the last 72 hours.  Coagulation profile  Recent Labs Lab 02/04/16 1525 02/09/16 0524  INR 0.91 1.02   CBG (last 3)   Recent Labs  02/09/16 2125 02/10/16 0809 02/10/16 0923  GLUCAP 214* 356* 343*     No results for input(s): DDIMER in the last 72 hours.  Cardiac Enzymes No results for input(s): CKMB, TROPONINI, MYOGLOBIN in the last 168 hours.  Invalid input(s): CK ------------------------------------------------------------------------------------------------------------------ No results found for: BNP  Inpatient Medications  Scheduled Meds: .  ceFAZolin (ANCEF) IV  2 g Intravenous To SS-Surg  . darifenacin  7.5 mg Oral Daily  . feeding supplement (GLUCERNA SHAKE)  237 mL Oral TID BM  . feeding supplement (PRO-STAT SUGAR FREE 64)  30 mL Oral TID WC  . gabapentin  300 mg Oral BID  . heparin subcutaneous  5,000 Units Subcutaneous 3 times per day  . insulin aspart  0-15 Units Subcutaneous Q4H  . insulin aspart  5 Units Subcutaneous TID WC  . insulin detemir  20 Units Subcutaneous BID  .  mometasone-formoterol  2 puff Inhalation BID  . saccharomyces boulardii  250 mg Oral BID  . sodium chloride flush  3 mL Intravenous Q12H  . Vitamin D (Ergocalciferol)  50,000 Units Oral Q7 days   Continuous Infusions: . sodium chloride 1,000 mL (02/10/16 0804)   PRN Meds:.acetaminophen **OR** [DISCONTINUED] acetaminophen, HYDROcodone-acetaminophen, ketoconazole, metoprolol, [DISCONTINUED] ondansetron **OR** ondansetron (ZOFRAN) IV  Micro Results Recent Results (from the past 240 hour(s))  Gastrointestinal Panel by PCR , Stool     Status: None   Collection Time: 02/04/16  8:40 PM  Result Value Ref Range Status   Campylobacter species NOT DETECTED NOT DETECTED Final   Plesimonas shigelloides NOT DETECTED NOT DETECTED Final   Salmonella species NOT DETECTED NOT DETECTED Final   Yersinia enterocolitica NOT DETECTED NOT DETECTED Final   Vibrio species NOT DETECTED NOT DETECTED Final   Vibrio cholerae NOT DETECTED NOT DETECTED Final   Enteroaggregative E coli (EAEC) NOT DETECTED NOT DETECTED Final   Enteropathogenic E coli (EPEC) NOT DETECTED NOT DETECTED Final   Enterotoxigenic E coli (ETEC) NOT DETECTED NOT DETECTED Final   Shiga like toxin producing E coli (STEC)  NOT DETECTED NOT DETECTED Final   E. coli O157 NOT DETECTED NOT DETECTED Final   Shigella/Enteroinvasive E coli (EIEC) NOT DETECTED NOT DETECTED Final   Cryptosporidium NOT DETECTED NOT DETECTED Final   Cyclospora cayetanensis NOT DETECTED NOT DETECTED Final   Entamoeba histolytica NOT DETECTED NOT DETECTED Final   Giardia lamblia NOT DETECTED NOT DETECTED Final   Adenovirus F40/41 NOT DETECTED NOT DETECTED Final   Astrovirus NOT DETECTED NOT DETECTED Final   Norovirus GI/GII NOT DETECTED NOT DETECTED Final   Rotavirus A NOT DETECTED NOT DETECTED Final   Sapovirus (I, II, IV, and V) NOT DETECTED NOT DETECTED Final  C difficile quick scan w PCR reflex     Status: None   Collection Time: 02/04/16  8:40 PM  Result Value Ref  Range Status   C Diff antigen NEGATIVE NEGATIVE Final   C Diff toxin NEGATIVE NEGATIVE Final   C Diff interpretation Negative for toxigenic C. difficile  Final  MRSA PCR Screening     Status: None   Collection Time: 02/09/16  5:00 PM  Result Value Ref Range Status   MRSA by PCR NEGATIVE NEGATIVE Final    Comment:        The GeneXpert MRSA Assay (FDA approved for NASAL specimens only), is one component of a comprehensive MRSA colonization surveillance program. It is not intended to diagnose MRSA infection nor to guide or monitor treatment for MRSA infections.     Radiology Reports Ct Abdomen Pelvis Wo Contrast  02/04/2016  CLINICAL DATA:  Lower abdominal pain with gas and bloating, diarrhea for 4 days, bright red blood in stool, rectal bleeding, hypertension, rheumatoid arthritis, lung cancer post radiation therapy, diabetes mellitus, former smoker EXAM: CT ABDOMEN AND PELVIS WITHOUT CONTRAST TECHNIQUE: Multidetector CT imaging of the abdomen and pelvis was performed following the standard protocol without IV contrast. Sagittal and coronal MPR images reconstructed from axial data set. COMPARISON:  PET-CT 11/14/2013, chest CT 11/30/2015 FINDINGS: Soft tissue density adjacent to RIGHT heart border likely reflects RIGHT middle lobe atelectasis, unchanged. Calcified granuloma LEFT lower lobe image 6 with additional tiny LEFT lower lobe nodule image 9, stable. Calcified granulomata within spleen. 9 mm cyst LEFT lobe liver image 11 unchanged. Questionable 7 mm subcapsular lesion lateral RIGHT lobe liver image 34. Lesion anterior lateral segment LEFT lobe liver 19 x 13 mm image 19 previously 14 x 9 mm. Gallbladder surgically absent. Remainder of liver, spleen, pancreas, kidneys, and adrenal glands normal. Wall thickening of ascending colon through distal transverse colon consistent with colitis. Associated significant hyperemia of transverse mesocolon. Cecum and distal colon appear unaffected. Stomach  and small bowel loops normal appearance. Normal appendix Uterus surgically absent with nonvisualization of ovaries. No additional mass, adenopathy, free air free fluid. Bladder and ureters normal appearance. Scattered atherosclerotic calcifications. No acute osseous findings. IMPRESSION: Diffuse wall thickening of the ascending through distal transverse colon with associated hyperemia of the mesocolon compatible with colitis ; differential diagnosis includes infection and inflammatory bowel disease, ischemia less likely with this distribution though not excluded. Interval increase in size of a lesion at the lateral segment LEFT lobe liver question hepatic metastasis. Electronically Signed   By: Lavonia Dana M.D.   On: 02/04/2016 16:47   Dg Ankle Complete Left  02/01/2016  CLINICAL DATA:  75 year old female fell in kitchen. Ankle pain. Initial encounter. EXAM: LEFT ANKLE COMPLETE - 3+ VIEW COMPARISON:  07/22/2014. FINDINGS: Oblique fracture of distal left fibula with slight separation of fracture fragments. Slight irregularity of  the cuboid. If there were tenderness in this region, left foot films recommended for further delineation. Plantar spur. IMPRESSION: Oblique fracture of distal left fibula with slight separation of fracture fragments. Slight irregularity of the cuboid. If there were tenderness in this region, left foot films recommended for further delineation. Electronically Signed   By: Genia Del M.D.   On: 02/01/2016 11:39   Dg Ankle Complete Right  02/01/2016  CLINICAL DATA:  [place] in kitchen this morning, patient states she was light headed when she fell, left ankle pain worse than right. Hx right ankle surgery x 2 by dr. Sullivan Lone: RIGHT ANKLE - COMPLETE 3+ VIEW COMPARISON:  08/01/2015 FINDINGS: Plate and screw fixation device within the distal right fibula. No acute fracture, subluxation or dislocation. Soft tissues are intact. IMPRESSION: No acute bony abnormality. Electronically Signed   By:  Rolm Baptise M.D.   On: 02/01/2016 11:36   Dg Chest Port 1 View  02/04/2016  CLINICAL DATA:  Cough, hypotension and diarrhea. History of squamous cell carcinoma of the lung. EXAM: PORTABLE CHEST 1 VIEW COMPARISON:  CT of the chest on 11/30/2015 FINDINGS: There is chronic volume loss of the right lung with dense chronic atelectasis and consolidation of the right middle lobe. No edema, infiltrate or pneumothorax identified. No pleural effusions are seen. The heart size is normal. IMPRESSION: Chronic right middle lobe atelectasis and consolidation. Electronically Signed   By: Aletta Edouard M.D.   On: 02/04/2016 22:02   Dg Foot Complete Left  02/01/2016  CLINICAL DATA:  75 year old female with ankle fracture. Foot pain. Post fall. Subsequent encounter. EXAM: LEFT FOOT - COMPLETE 3+ VIEW COMPARISON:  02/01/2016 ankle films. FINDINGS: Fracture of the distal left fibula as noted on ankle films. No other fracture or dislocation noted. Plantar spur. IMPRESSION: Fracture of the distal left fibula as noted on ankle films. No other fracture or dislocation noted. Electronically Signed   By: Genia Del M.D.   On: 02/01/2016 13:12    Time Spent in minutes  30   SINGH,PRASHANT K M.D on 02/10/2016 at 9:27 AM  Between 7am to 7pm - Pager - [phone]  After 7pm go to [email or web address] - password [facility]  [facility] -  Office  [phone]

## 2016-02-10 NOTE — Progress Notes (Signed)
Patient went to OR for left ankle fracture repair. Alert and oriented x4; no complaints at this time. Report was given to the nurse in short stay.

## 2016-02-10 NOTE — Interval H&P Note (Signed)
History and Physical Interval Note:  02/10/2016 6:46 AM  Megan [name]  has presented today for surgery, with the diagnosis of Left Ankle Fracture  The various methods of treatment have been discussed with the patient and family. After consideration of risks, benefits and other options for treatment, the patient has consented to  Procedure(s): OPEN REDUCTION INTERNAL FIXATION (ORIF) ANKLE FRACTURE (Left) as a surgical intervention .  The patient's history has been reviewed, patient examined, no change in status, stable for surgery.  I have reviewed the patient's chart and labs.  Questions were answered to the patient's satisfaction.     [name] V

## 2016-02-10 NOTE — Anesthesia Postprocedure Evaluation (Signed)
Anesthesia Post Note  Patient: Megan [name]  Procedure(s) Performed: Procedure(s) (LRB): OPEN REDUCTION INTERNAL FIXATION (ORIF) ANKLE FRACTURE (Left)  Patient location during evaluation: PACU Anesthesia Type: General and Regional Level of consciousness: awake and alert Pain management: pain level controlled Vital Signs Assessment: post-procedure vital signs reviewed and stable Respiratory status: spontaneous breathing, nonlabored ventilation, respiratory function stable and patient connected to nasal cannula oxygen Cardiovascular status: blood pressure returned to baseline and stable Postop Assessment: no signs of nausea or vomiting Anesthetic complications: no    Last Vitals:  Filed Vitals:   02/10/16 1300 02/10/16 1327  BP: 124/55 124/61  Pulse: 80 78  Temp:    Resp: 20 19    Last Pain:  Filed Vitals:   02/10/16 1329  PainSc: 0-No pain                 Tiajuana Amass

## 2016-02-10 NOTE — Progress Notes (Addendum)
Nutrition Follow-up  DOCUMENTATION CODES:   Not applicable  INTERVENTION:   -D/c Glucerna Shake, due to poor acceptance -D/c Prostat, due to poor acceptance -RD will monitor for diet advancement and adequacy of PO intake  NUTRITION DIAGNOSIS:   Inadequate oral intake related to poor appetite as evidenced by meal completion < 50%.  Progressing  GOAL:   Patient will meet greater than or equal to 90% of their needs  Progressing  MONITOR:   PO intake, Supplement acceptance, Labs, Weight trends, Skin, I & O's  REASON FOR ASSESSMENT:   Malnutrition Screening Tool    ASSESSMENT:   Megan [name] is a 75 y.o. female with a past medical history of diabetes on insulin, stage III lung cancer followed by oncology, history of rheumatoid arthritis, history of orthostatic hypotension who was in her usual state of health till this past Monday when she had an episode of syncope. Patient tells me that that she has orthostatic hypotension, which causes her to pass out once in a while  Pt out of room for scheduled ORIF of lt ankle today.   Case discussed with RN. She report's pt's intake has improved greatly since admission. She is consuming meals well; PO: 75-100% per doc flowsheet records. RN reports that blood sugars were elevated yesterday (in the 400's) and RN was correcting with insulin. Reviewed DM coordinator note from 02/09/16; recommending insulin meal coverage (basal insulin orders increased on 02/08/16, however, pt did not receive basal insulin dose that day due to dosing changes).   RN reports that pt does not like the Glucerna and Prostat supplements and is refusing them. RD will s/c due to increased PO intake and poor acceptance.   Labs reviewed: CBGS: 214-356.  Diet Order:  Diet NPO time specified  Skin:  Reviewed, no issues  Last BM:  02/04/16  Height:   Ht Readings from Last 1 Encounters:  02/04/16 '5\' 6"'$  (1.676 m)    Weight:   Wt Readings from Last 1  Encounters:  02/04/16 155 lb 1.6 oz (70.353 kg)    Ideal Body Weight:  59.1 kg  BMI:  Body mass index is 25.05 kg/(m^2).  Estimated Nutritional Needs:   Kcal:  1550-1750  Protein:  75-90 grams  Fluid:  1.5-1.7 L  EDUCATION NEEDS:   No education needs identified at this time  Nathan Stallworth A. Jimmye Norman, RD, LDN, CDE Pager: [phone] After hours Pager: [phone]

## 2016-02-10 NOTE — Anesthesia Preprocedure Evaluation (Signed)
Anesthesia Evaluation  Patient identified by MRN, date of birth, ID band Patient awake    Reviewed: Allergy & Precautions, NPO status , Patient's Chart, lab work & pertinent test results  Airway Mallampati: II  TM Distance: >3 FB Neck ROM: Full  Mouth opening: Limited Mouth Opening  Dental  (+) Teeth Intact, Dental Advisory Given   Pulmonary shortness of breath and with exertion, COPD, former smoker,  S/p lung radiation   breath sounds clear to auscultation       Cardiovascular hypertension, Pt. on medications  Rhythm:Regular Rate:Normal     Neuro/Psych Depression negative neurological ROS     GI/Hepatic Neg liver ROS, hiatal hernia, GERD  ,  Endo/Other  diabetes, Poorly Controlled, Insulin Dependent  Renal/GU negative Renal ROS     Musculoskeletal  (+) Arthritis ,   Abdominal   Peds  Hematology  (+) anemia ,   Anesthesia Other Findings   Reproductive/Obstetrics                             Lab Results  Component Value Date   WBC 6.7 02/08/2016   HGB 11.5* 02/08/2016   HCT 34.5* 02/08/2016   MCV 89.6 02/08/2016   PLT 198 02/08/2016   Lab Results  Component Value Date   CREATININE 0.89 02/09/2016   BUN 10 02/09/2016   NA 135 02/09/2016   K 4.5 02/09/2016   CL 102 02/09/2016   CO2 22 02/09/2016    Anesthesia Physical  Anesthesia Plan  ASA: III  Anesthesia Plan: General   Post-op Pain Management:  Regional for Post-op pain   Induction: Intravenous  Airway Management Planned: LMA  Additional Equipment:   Intra-op Plan:   Post-operative Plan: Extubation in OR  Informed Consent: I have reviewed the patients History and Physical, chart, labs and discussed the procedure including the risks, benefits and alternatives for the proposed anesthesia with the patient or authorized representative who has indicated his/her understanding and acceptance.   Dental advisory  given  Plan Discussed with: CRNA  Anesthesia Plan Comments:         Anesthesia Quick Evaluation

## 2016-02-11 ENCOUNTER — Encounter (HOSPITAL_COMMUNITY): Payer: Self-pay | Admitting: Orthopedic Surgery

## 2016-02-11 DIAGNOSIS — K529 Noninfective gastroenteritis and colitis, unspecified: Secondary | ICD-10-CM | POA: Diagnosis not present

## 2016-02-11 LAB — CBC
HEMATOCRIT: 33.3 % — AB (ref 36.0–46.0)
Hemoglobin: 10.6 g/dL — ABNORMAL LOW (ref 12.0–15.0)
MCH: 29 pg (ref 26.0–34.0)
MCHC: 31.8 g/dL (ref 30.0–36.0)
MCV: 91.2 fL (ref 78.0–100.0)
PLATELETS: 198 10*3/uL (ref 150–400)
RBC: 3.65 MIL/uL — ABNORMAL LOW (ref 3.87–5.11)
RDW: 15 % (ref 11.5–15.5)
WBC: 7.5 10*3/uL (ref 4.0–10.5)

## 2016-02-11 LAB — BASIC METABOLIC PANEL
Anion gap: 10 (ref 5–15)
BUN: 8 mg/dL (ref 6–20)
CO2: 28 mmol/L (ref 22–32)
Calcium: 8.1 mg/dL — ABNORMAL LOW (ref 8.9–10.3)
Chloride: 103 mmol/L (ref 101–111)
Creatinine, Ser: 0.72 mg/dL (ref 0.44–1.00)
GLUCOSE: 149 mg/dL — AB (ref 65–99)
Potassium: 3.8 mmol/L (ref 3.5–5.1)
Sodium: 141 mmol/L (ref 135–145)

## 2016-02-11 LAB — GLUCOSE, CAPILLARY
GLUCOSE-CAPILLARY: 70 mg/dL (ref 65–99)
GLUCOSE-CAPILLARY: 284 mg/dL — AB (ref 65–99)
GLUCOSE-CAPILLARY: 69 mg/dL (ref 65–99)
GLUCOSE-CAPILLARY: 71 mg/dL (ref 65–99)
Glucose-Capillary: 231 mg/dL — ABNORMAL HIGH (ref 65–99)

## 2016-02-11 MED ORDER — HYDROCODONE-ACETAMINOPHEN 5-325 MG PO TABS
1.0000 | ORAL_TABLET | ORAL | Status: DC | PRN
  Administered 2016-02-11 (×2): 1 via ORAL
  Filled 2016-02-11: qty 1

## 2016-02-11 MED ORDER — HYDROCODONE-ACETAMINOPHEN 5-325 MG PO TABS
1.0000 | ORAL_TABLET | ORAL | Status: DC | PRN
  Administered 2016-02-11: 1 via ORAL
  Administered 2016-02-11 – 2016-02-12 (×2): 2 via ORAL
  Filled 2016-02-11: qty 1
  Filled 2016-02-11 (×2): qty 2

## 2016-02-11 MED ORDER — INSULIN DETEMIR 100 UNIT/ML ~~LOC~~ SOLN
20.0000 [IU] | Freq: Two times a day (BID) | SUBCUTANEOUS | Status: DC
  Administered 2016-02-11 – 2016-02-12 (×3): 20 [IU] via SUBCUTANEOUS
  Filled 2016-02-11 (×4): qty 0.2

## 2016-02-11 MED ORDER — MORPHINE SULFATE (PF) 2 MG/ML IV SOLN
2.0000 mg | INTRAVENOUS | Status: DC | PRN
  Administered 2016-02-11 – 2016-02-12 (×2): 2 mg via INTRAVENOUS
  Filled 2016-02-11 (×3): qty 1

## 2016-02-11 NOTE — Evaluation (Signed)
Physical Therapy Evaluation Patient Details Name: Megan [name] MRN: [id number] DOB: 1941/02/17 Today's Date: 02/11/2016   History of Present Illness  Pt adm with colitis and lt ankle fx after fall due to presyncopal episode. Underwent ORIF of lt ankle on 02/10/16. PMH - rt ankle fx, DM, lung CA, RA, HTN, neuropathy  Clinical Impression  Pt admitted with above diagnosis and presents to PT with functional limitations due to deficits listed below (See PT problem list). Pt needs skilled PT to maximize independence and safety to allow discharge to SNF.  Pt with decline in bed mobility, transfers, and gait due to weight bearing limitations and pain.     Follow Up Recommendations SNF    Equipment Recommendations  None recommended by PT    Recommendations for Other Services       Precautions / Restrictions Precautions Precautions: Fall Required Braces or Orthoses: Other Brace/Splint Other Brace/Splint: CAM boot on when up Restrictions Weight Bearing Restrictions: Yes LLE Weight Bearing: Touchdown weight bearing      Mobility  Bed Mobility Overal bed mobility: Needs Assistance Bed Mobility: Supine to Sit     Supine to sit: Min assist     General bed mobility comments: Assist to move LLE  Transfers Overall transfer level: Needs assistance Equipment used: Rolling walker (2 wheeled) Transfers: Sit to/from [facility] Sit to Stand: Min assist Stand pivot transfers: Min assist       General transfer comment: Assist to bring hips up and for balance. Verbal cues for hand placement and for walker placement during pivot  Ambulation/Gait                Stairs            Wheelchair Mobility    Modified Rankin (Stroke Patients Only)       Balance Overall balance assessment: Needs assistance Sitting-balance support: No upper extremity supported;Feet supported Sitting balance-Leahy Scale: Normal     Standing balance support: Bilateral  upper extremity supported Standing balance-Leahy Scale: Poor Standing balance comment: rolling walker for support due to weight bearing limitation on LLE                             Pertinent Vitals/Pain Pain Assessment: Faces Faces Pain Scale: Hurts even more Pain Location: lt ankle Pain Descriptors / Indicators: Grimacing;Guarding Pain Intervention(s): Limited activity within patient's tolerance;Monitored during session;Repositioned    Home Living Family/patient expects to be discharged to:: Skilled nursing facility Living Arrangements: Alone   Type of Home: House Home Access: Stairs to enter   [employer] of Steps: 1 Home Layout: One level Home Equipment: Walker - 4 wheels;Walker - standard;Shower seat      Prior Function Level of Independence: Independent               Hand Dominance        Extremity/Trunk Assessment   Upper Extremity Assessment: Defer to OT evaluation           Lower Extremity Assessment: LLE deficits/detail   LLE Deficits / Details: hip flex 3/5, lower leg in splint and CAM boot  Cervical / Trunk Assessment: Normal  Communication   Communication: HOH  Cognition Arousal/Alertness: Awake/alert Behavior During Therapy: WFL for tasks assessed/performed Overall Cognitive Status: Within Functional Limits for tasks assessed                      General Comments  Exercises        Assessment/Plan    PT Assessment Patient needs continued PT services  PT Diagnosis Difficulty walking;Acute pain   PT Problem List Decreased strength;Decreased mobility;Decreased balance;Decreased knowledge of use of DME;Pain  PT Treatment Interventions DME instruction;Gait training;Functional mobility training;Therapeutic activities;Therapeutic exercise;Balance training;Patient/family education   PT Goals (Current goals can be found in the Care Plan section) Acute Rehab PT Goals Patient Stated Goal: Return home  eventually PT Goal Formulation: With patient Time For Goal Achievement: 02/18/16 Potential to Achieve Goals: Good    Frequency Min 3X/week   Barriers to discharge Inaccessible home environment;Decreased caregiver support Lives alone and has step to get in    Co-evaluation               End of Session Equipment Utilized During Treatment: Gait belt Activity Tolerance: Patient tolerated treatment well Patient left: in chair;with call bell/phone within reach;with chair alarm set Nurse Communication: Mobility status    Functional Assessment Tool Used: clinical judgement Functional Limitation: Mobility: Walking and moving around Mobility: Walking and Moving Around Current Status [phone]): At least 20 percent but less than 40 percent impaired, limited or restricted Mobility: Walking and Moving Around Goal Status [phone]): At least 1 percent but less than 20 percent impaired, limited or restricted    Time: 0926-0948 PT Time Calculation (min) (ACUTE ONLY): 22 min   Charges:   PT Evaluation $PT Eval Moderate Complexity: 1 Procedure     PT G Codes:   PT G-Codes **NOT FOR INPATIENT CLASS** Functional Assessment Tool Used: clinical judgement Functional Limitation: Mobility: Walking and moving around Mobility: Walking and Moving Around Current Status ([id number]): At least 20 percent but less than 40 percent impaired, limited or restricted Mobility: Walking and Moving Around Goal Status [phone]): At least 1 percent but less than 20 percent impaired, limited or restricted    [facility] 02/11/2016, 10:21 AM [facility] PT [phone]

## 2016-02-11 NOTE — Plan of Care (Signed)
Problem: Food- and Nutrition-Related Knowledge Deficit (NB-1.1) Goal: Nutrition education Formal process to instruct or train a patient/client in a skill or to impart knowledge to help patients/clients voluntarily manage or modify food choices and eating behavior to maintain or improve health. Outcome: Adequate for Discharge  RD consulted for nutrition education regarding diabetes.   No results found for: HGBA1C   Spoke with pt at bedside. She reports she was diagnosed with DM a few years ago and attended DMSE/T, however, has multiple questions related to DM management. She expressed concern over elevated CBGs during hospitalization. She had multiple questions regarding carbohydrate counting and nutrition therapy related to DM. Pt expressed intention to better control her DM once she is discharged.   RD provided "Carbohydrate Counting for People with Diabetes" handout from the Academy of [employer] Dietetics. Discussed different food groups and their effects on blood sugar, emphasizing carbohydrate-containing foods. Provided list of carbohydrates and recommended serving sizes of common foods.  Discussed importance of controlled and consistent carbohydrate intake throughout the day. Provided examples of ways to balance meals/snacks and encouraged intake of high-fiber, whole grain complex carbohydrates. Teach back method used.  Expect good compliance.  Body mass index is 25.05 kg/(m^2). Pt meets criteria for overweight based on current BMI.  Current diet order is Carb Modified, patient is consuming approximately 75-100% of meals at this time. Labs and medications reviewed. RD will continue to follow.   Waylon Koffler A. Jimmye Norman, RD, LDN, CDE Pager: [phone] After hours Pager: [phone]

## 2016-02-11 NOTE — Progress Notes (Signed)
Patient ID: Megan [name], female   DOB: 1941-05-25, 75 y.o.   MRN: [id number] Postoperative day 1 left ankle open reduction internal fixation. Patient may be touchdown weightbearing with her fracture boot in place. She does not need to wear the boot if she is laying in bed. We will follow up in the office in 1 week. Anticipate discharge to skilled nursing.

## 2016-02-11 NOTE — Progress Notes (Signed)
OT Cancellation Note  Patient Details Name: Megan [name] MRN: [id number] DOB: 10-07-1941   Cancelled Treatment:    Reason Eval/Treat Not Completed: Pain limiting ability to participate. Pt reporting 9/10 pain in leg. Pt reports, "I had PT and sat up in the chair for 2 hours, I just got back in the bed and I'm done for the day." OT explained role of therapy and importance of mobilizing at intervals throughout the day. OT offering to reattempt later today, timed with pain meds. Pt reporting she feels it is unlikely she will be agreeable to therapy then either. OT to reattempt evaluation as schedule permits.   Hortencia Pilar 02/11/2016, 12:02 PM

## 2016-02-11 NOTE — Clinical Social Work Note (Signed)
Clinical Social Work Assessment  Patient Details  Name: Megan [name] MRN: [id number] Date of Birth: 04-11-1941  Date of referral:  02/11/16               Reason for consult:  Facility Placement                Permission sought to share information with:  Family Supports, Chartered certified accountant granted to share information::  Yes, Verbal Permission Granted  Name::        Agency::  [facility]  Relationship::     Contact Information:     Housing/Transportation Living arrangements for the past 2 months:  [place] of Information:  Patient Patient Interpreter Needed:  None Criminal Activity/Legal Involvement Pertinent to Current Situation/Hospitalization:  No - Comment as needed Significant Relationships:  Significant Other Lives with:  Self Do you feel safe going back to the place where you live?  No Need for family participation in patient care:  No (Coment)  Care giving concerns:  Pt lives at home alone and is currently impaired following foot fracture- does not have enough assistance at home to return home safely    Social Worker assessment / plan:  CSW spoke with pt concerning PT recommendation for SNF.  Pt is aware of recommendation for SNF and states she has been placed for rehab in the past and is familiar with the process.  Pt plans to be placed in assisted living within the next year at [place] to help prevent continued fractures which have lead to her multiple admissions to SNF in recent years.  Employment status:  Retired Nurse, adult PT Recommendations:  [place] / Referral to community resources:  [place]  Patient/Family's Response to care:  Pt is agreeable to SNF placement and recognizes she would be unsafe to return home at this time.  Patient/Family's Understanding of and Emotional Response to Diagnosis, Current Treatment, and Prognosis:  Pt  has good understanding of her condition since she has had previous fractures.  Pt is hopeful about rehab but wishes that this wouldn't have happened during spring when she would prefer to spend time outside- hopeful that she will recover quickly and be able to return home.  Emotional Assessment Appearance:  Appears stated age Attitude/Demeanor/Rapport:    Affect (typically observed):  Appropriate, Pleasant, Accepting Orientation:  Oriented to Self, Oriented to Place, Oriented to  Time, Oriented to Situation Alcohol / Substance use:  Not Applicable Psych involvement (Current and /or in the community):  No (Comment)  Discharge Needs  Concerns to be addressed:  Care Coordination, Discharge Planning Concerns Readmission within the last 30 days:  No Current discharge risk:  Physical Impairment Barriers to Discharge:  Continued Medical Work up   Cranford Mon, LCSW 02/11/2016, 10:32 AM

## 2016-02-11 NOTE — Progress Notes (Addendum)
TRH Progress Note                                                                                                                                                                                                                      Patient Demographics:    Megan [name], is a 75 y.o. female, DOB - Sep 26, 1941, JWJ:[id number]  Admit date - 02/04/2016   Admitting Physician Bonnielee Haff, MD  Outpatient Primary MD for the patient is [name], MD  LOS - 7  Outpatient Specialists:   Chief Complaint  Patient presents with  . Hypotension  . Rectal Bleeding      Summary  Pleasant 75 y.o. female with a past medical history of diabetes on insulin, stage III lung cancer followed by oncology, history of rheumatoid arthritis, history of orthostatic hypotension who was in her usual state of health till she had an episode of syncope on 02/01/2016, she hurt her left ankle and fractured it, she had a similar episode a few months ago and she had fractured her right ankle, she has had multiple episodes of syncopal events in the past. Upon admission she also had diffuse diarrhea and a GI workup was done which included a colonoscopy all suggestive of infectious colitis which has been adequately treated.  Her diarrhea has resolved, she was also seen by orthopedic surgeon Dr. Sharol Given and she underwent surgical correction of her left similar fracture on 02/10/2016. She lives alone and will require SNF. Due to her recurrent syncope 30 day event monitor has also been arranged, however I think her most likely cause of recurrent syncope is orthostatic hypotension, she was on a ARB which will be discontinued upon discharge.    Subjective:    Megan [name] today has, No headache, No chest pain, No abdominal pain - No Nausea, No new weakness tingling or numbness, No Cough - SOB. Improved  diarrhea. Minimal left leg pain, awaits surgery for her left leg on 02/10/2016.   Assessment  & Plan :     1. Acute diffuse colitis with diarrhea. Diarrhea has improved, GI pathogen panel negative, H&H when accounted for dilution with IV fluids is stable. No further GI bleed. GI on board, finished IV Cipro Flagyl , Had colonoscopy on 02/07/2016. Colonoscopy suggested most likely of infectious colitis, continue present care, symptoms have completely resolved. Outpatient follow-up with  GI in 1-2 weeks.  2. ARF due to dehydration with hypotension all caused by diarrhea and decreased oral intake along with ARB. Resolved  with hydration. Discontinue ARB upon discharge as she has recurrent episodes of syncope at home which sound like orthostatic episodes.  3. Fall with left distal fibula fracture. Underwent surgical correction by Dr. Sharol Given on 02/10/2016, touch toe weightbearing on left leg, aspirin for DVT prophylaxis, minimal perioperative blood loss with anemia not requiring transfusion, she is supposed to wear the cam boot when out of bed, she lives alone and will require SNF more so in the light of multiples falls previously.  4. History of lung cancer. Currently off treatment, follow with oncology post discharge.  5. Multiple episodes of syncope. Most likely due to hypotension, hold ARB upon discharge, will add 30 day event monitor and outpatient cardiology follow-up as well. Discussed with cardiologist.  6. Hypocalcemia. Mild, due to GI losses, was stable upon admission. Replaced and stable.   7. DM type II on Levimir, sliding scale + pre-meal NovoLog, will drop Levemir dose slightly.  CBG (last 3)   Recent Labs  02/10/16 1619 02/10/16 2228 02/11/16 0825  GLUCAP 269* 357* 70     No results found for: HGBA1C   Code Status : Full  Family Communication  : None  Disposition Plan  : SNF  Barriers For Discharge : None DC Post op day #2,  02-12-16  Consults  :  GI,  orthopedics  Procedures  :   CT abdomen pelvis with diffuse colitis  Colonoscopy None for 07/20/2016 with colonic ulcers noted suggestive of most likely infection versus IBD, less likely ischemia.  Left distal fibula surgical correction on 02/10/2016  DVT Prophylaxis  :  Heparin - SCDs    Lab Results  Component Value Date   PLT 198 02/11/2016    Antibiotics  :     Anti-infectives    Start     Dose/Rate Route Frequency Ordered Stop   02/10/16 1700  ceFAZolin (ANCEF) IVPB 1 g/50 mL premix     1 g 100 mL/hr over 30 Minutes Intravenous Every 6 hours 02/10/16 1254 02/11/16 0457   02/10/16 1000  ceFAZolin (ANCEF) IVPB 2g/100 mL premix     2 g 200 mL/hr over 30 Minutes Intravenous To [facility]Stay [facility] 02/09/16 1200 02/10/16 1106   02/05/16 0600  [MAR Hold]  ciprofloxacin (CIPRO) IVPB 400 mg  Status:  Discontinued     (MAR Hold since 02/07/16 1104)   400 mg 200 mL/hr over 60 Minutes Intravenous Every 12 hours 02/04/16 1943 02/07/16 1239   02/05/16 0200  [MAR Hold]  metroNIDAZOLE (FLAGYL) IVPB 500 mg  Status:  Discontinued     (MAR Hold since 02/07/16 1104)   500 mg 100 mL/hr over 60 Minutes Intravenous Every 8 hours 02/04/16 1943 02/07/16 1239   02/04/16 1800  ciprofloxacin (CIPRO) IVPB 400 mg  Status:  Discontinued     400 mg 200 mL/hr over 60 Minutes Intravenous  Once 02/04/16 1748 02/07/16 1311   02/04/16 1800  metroNIDAZOLE (FLAGYL) IVPB 500 mg     500 mg 100 mL/hr over 60 Minutes Intravenous  Once 02/04/16 1748 02/04/16 2100        Objective:   Filed Vitals:   02/10/16 1357 02/10/16 1800 02/10/16 2233 02/11/16 0508  BP: 120/66 121/61 112/45 117/63  Pulse: 80 72 103 100  Temp:   99.6 F (37.6 C) 98 F (36.7 C)  TempSrc:   Oral Oral  Resp: '18 18 18   '$ Height:      Weight:      SpO2: 99% 99% 100% 93%  Wt Readings from Last 3 Encounters:  02/04/16 70.353 kg (155 lb 1.6 oz)  02/04/16 63.957 kg (141 lb)  12/01/15 61.735 kg (136 lb 1.6 oz)     Intake/Output  Summary (Last 24 hours) at 02/11/16 0925 Last data filed at 02/11/16 0600  Gross per 24 hour  Intake 1579.33 ml  Output   1700 ml  Net -120.67 ml     Physical Exam  Awake Alert, Oriented X 3, No new F.N deficits, Normal affect [place].AT,PERRAL Supple Neck,No JVD, No cervical lymphadenopathy appriciated.  Symmetrical Chest wall movement, Good air movement bilaterally, CTAB RRR,No Gallops,Rubs or new Murmurs, No Parasternal Heave +ve B.Sounds, Abd Soft, No tenderness, No organomegaly appriciated, No rebound - guarding or rigidity. No Cyanosis, Clubbing or edema, No new Rash or bruise, L leg in CAM boot/cast    Data Review:    CBC  Recent Labs Lab 02/05/16 0401 02/06/16 0735 02/07/16 0740 02/08/16 0530 02/11/16 0458  WBC 7.3 6.2 5.5 6.7 7.5  HGB 11.9* 10.9* 11.2* 11.5* 10.6*  HCT 36.3 33.6* 34.9* 34.5* 33.3*  PLT 177 188 190 198 198  MCV 89.0 90.3 89.9 89.6 91.2  MCH 29.2 29.3 28.9 29.9 29.0  MCHC 32.8 32.4 32.1 33.3 31.8  RDW 14.7 14.8 14.8 14.8 15.0    Chemistries   Recent Labs Lab 02/05/16 0401 02/05/16 0858 02/06/16 0735 02/07/16 0740 02/08/16 0530 02/09/16 0524 02/11/16 0458  NA 135  --  141 139 141 135 141  K 3.2*  --  3.7 4.0 3.8 4.5 3.8  CL 103  --  107 108 107 102 103  CO2 22  --  '27 23 23 22 28  '$ GLUCOSE 263*  --  132* 187* 118* 521* 149*  BUN 16  --  6 <5* <5* 10 8  CREATININE 1.16*  --  0.82 0.72 0.60 0.89 0.72  CALCIUM 7.9*  --  7.9* 8.0* 8.1* 7.7* 8.1*  MG  --  1.6*  --   --   --   --   --   AST 10*  --  8* 13* 17 18  --   ALT 10*  --  7* 9* 9* 12*  --   ALKPHOS 86  --  78 86 88 112  --   BILITOT 0.8  --  0.6 0.5 0.5 1.0  --    ------------------------------------------------------------------------------------------------------------------ No results for input(s): CHOL, HDL, LDLCALC, TRIG, CHOLHDL, LDLDIRECT in the last 72 hours.  No results found for:  HGBA1C ------------------------------------------------------------------------------------------------------------------ No results for input(s): TSH, T4TOTAL, T3FREE, THYROIDAB in the last 72 hours.  Invalid input(s): FREET3 ------------------------------------------------------------------------------------------------------------------ No results for input(s): VITAMINB12, FOLATE, FERRITIN, TIBC, IRON, RETICCTPCT in the last 72 hours.  Coagulation profile  Recent Labs Lab 02/04/16 1525 02/09/16 0524  INR 0.91 1.02   CBG (last 3)   Recent Labs  02/10/16 1619 02/10/16 2228 02/11/16 0825  GLUCAP 269* 357* 70     No results for input(s): DDIMER in the last 72 hours.  Cardiac Enzymes No results for input(s): CKMB, TROPONINI, MYOGLOBIN in the last 168 hours.  Invalid input(s): CK ------------------------------------------------------------------------------------------------------------------ No results found for: BNP  Inpatient Medications  Scheduled Meds: . darifenacin  7.5 mg Oral Daily  . gabapentin  300 mg Oral BID  . heparin subcutaneous  5,000 Units Subcutaneous 3 times per day  . insulin aspart  0-15 Units Subcutaneous TID WC  . insulin aspart  0-5 Units Subcutaneous QHS  . insulin aspart  5 Units Subcutaneous TID WC  .  insulin detemir  25 Units Subcutaneous BID  . mometasone-formoterol  2 puff Inhalation BID  . saccharomyces boulardii  250 mg Oral BID  . sodium chloride flush  3 mL Intravenous Q12H  . Vitamin D (Ergocalciferol)  50,000 Units Oral Q7 days   Continuous Infusions: . sodium chloride Stopped (02/11/16 0840)   PRN Meds:.acetaminophen **OR** [DISCONTINUED] acetaminophen, HYDROcodone-acetaminophen, ketoconazole, metoprolol, [DISCONTINUED] ondansetron **OR** ondansetron (ZOFRAN) IV  Micro Results Recent Results (from the past 240 hour(s))  Gastrointestinal Panel by PCR , Stool     Status: None   Collection Time: 02/04/16  8:40 PM  Result Value  Ref Range Status   Campylobacter species NOT DETECTED NOT DETECTED Final   Plesimonas shigelloides NOT DETECTED NOT DETECTED Final   Salmonella species NOT DETECTED NOT DETECTED Final   Yersinia enterocolitica NOT DETECTED NOT DETECTED Final   Vibrio species NOT DETECTED NOT DETECTED Final   Vibrio cholerae NOT DETECTED NOT DETECTED Final   Enteroaggregative E coli (EAEC) NOT DETECTED NOT DETECTED Final   Enteropathogenic E coli (EPEC) NOT DETECTED NOT DETECTED Final   Enterotoxigenic E coli (ETEC) NOT DETECTED NOT DETECTED Final   Shiga like toxin producing E coli (STEC) NOT DETECTED NOT DETECTED Final   E. coli O157 NOT DETECTED NOT DETECTED Final   Shigella/Enteroinvasive E coli (EIEC) NOT DETECTED NOT DETECTED Final   Cryptosporidium NOT DETECTED NOT DETECTED Final   Cyclospora cayetanensis NOT DETECTED NOT DETECTED Final   Entamoeba histolytica NOT DETECTED NOT DETECTED Final   Giardia lamblia NOT DETECTED NOT DETECTED Final   Adenovirus F40/41 NOT DETECTED NOT DETECTED Final   Astrovirus NOT DETECTED NOT DETECTED Final   Norovirus GI/GII NOT DETECTED NOT DETECTED Final   Rotavirus A NOT DETECTED NOT DETECTED Final   Sapovirus (I, II, IV, and V) NOT DETECTED NOT DETECTED Final  C difficile quick scan w PCR reflex     Status: None   Collection Time: 02/04/16  8:40 PM  Result Value Ref Range Status   C Diff antigen NEGATIVE NEGATIVE Final   C Diff toxin NEGATIVE NEGATIVE Final   C Diff interpretation Negative for toxigenic C. difficile  Final  MRSA PCR Screening     Status: None   Collection Time: 02/09/16  5:00 PM  Result Value Ref Range Status   MRSA by PCR NEGATIVE NEGATIVE Final    Comment:        The GeneXpert MRSA Assay (FDA approved for NASAL specimens only), is one component of a comprehensive MRSA colonization surveillance program. It is not intended to diagnose MRSA infection nor to guide or monitor treatment for MRSA infections.     Radiology Reports Ct  Abdomen Pelvis Wo Contrast  02/04/2016  CLINICAL DATA:  Lower abdominal pain with gas and bloating, diarrhea for 4 days, bright red blood in stool, rectal bleeding, hypertension, rheumatoid arthritis, lung cancer post radiation therapy, diabetes mellitus, former smoker EXAM: CT ABDOMEN AND PELVIS WITHOUT CONTRAST TECHNIQUE: Multidetector CT imaging of the abdomen and pelvis was performed following the standard protocol without IV contrast. Sagittal and coronal MPR images reconstructed from axial data set. COMPARISON:  PET-CT 11/14/2013, chest CT 11/30/2015 FINDINGS: Soft tissue density adjacent to RIGHT heart border likely reflects RIGHT middle lobe atelectasis, unchanged. Calcified granuloma LEFT lower lobe image 6 with additional tiny LEFT lower lobe nodule image 9, stable. Calcified granulomata within spleen. 9 mm cyst LEFT lobe liver image 11 unchanged. Questionable 7 mm subcapsular lesion lateral RIGHT lobe liver image 34. Lesion anterior lateral  segment LEFT lobe liver 19 x 13 mm image 19 previously 14 x 9 mm. Gallbladder surgically absent. Remainder of liver, spleen, pancreas, kidneys, and adrenal glands normal. Wall thickening of ascending colon through distal transverse colon consistent with colitis. Associated significant hyperemia of transverse mesocolon. Cecum and distal colon appear unaffected. Stomach and small bowel loops normal appearance. Normal appendix Uterus surgically absent with nonvisualization of ovaries. No additional mass, adenopathy, free air free fluid. Bladder and ureters normal appearance. Scattered atherosclerotic calcifications. No acute osseous findings. IMPRESSION: Diffuse wall thickening of the ascending through distal transverse colon with associated hyperemia of the mesocolon compatible with colitis ; differential diagnosis includes infection and inflammatory bowel disease, ischemia less likely with this distribution though not excluded. Interval increase in size of a lesion at  the lateral segment LEFT lobe liver question hepatic metastasis. Electronically Signed   By: Lavonia Dana M.D.   On: 02/04/2016 16:47   Dg Ankle Complete Left  02/01/2016  CLINICAL DATA:  75 year old female fell in kitchen. Ankle pain. Initial encounter. EXAM: LEFT ANKLE COMPLETE - 3+ VIEW COMPARISON:  07/22/2014. FINDINGS: Oblique fracture of distal left fibula with slight separation of fracture fragments. Slight irregularity of the cuboid. If there were tenderness in this region, left foot films recommended for further delineation. Plantar spur. IMPRESSION: Oblique fracture of distal left fibula with slight separation of fracture fragments. Slight irregularity of the cuboid. If there were tenderness in this region, left foot films recommended for further delineation. Electronically Signed   By: Genia Del M.D.   On: 02/01/2016 11:39   Dg Ankle Complete Right  02/01/2016  CLINICAL DATA:  [place] in kitchen this morning, patient states she was light headed when she fell, left ankle pain worse than right. Hx right ankle surgery x 2 by dr. Sullivan Lone: RIGHT ANKLE - COMPLETE 3+ VIEW COMPARISON:  08/01/2015 FINDINGS: Plate and screw fixation device within the distal right fibula. No acute fracture, subluxation or dislocation. Soft tissues are intact. IMPRESSION: No acute bony abnormality. Electronically Signed   By: Rolm Baptise M.D.   On: 02/01/2016 11:36   Dg Chest Port 1 View  02/04/2016  CLINICAL DATA:  Cough, hypotension and diarrhea. History of squamous cell carcinoma of the lung. EXAM: PORTABLE CHEST 1 VIEW COMPARISON:  CT of the chest on 11/30/2015 FINDINGS: There is chronic volume loss of the right lung with dense chronic atelectasis and consolidation of the right middle lobe. No edema, infiltrate or pneumothorax identified. No pleural effusions are seen. The heart size is normal. IMPRESSION: Chronic right middle lobe atelectasis and consolidation. Electronically Signed   By: Aletta Edouard M.D.   On:  02/04/2016 22:02   Dg Foot Complete Left  02/01/2016  CLINICAL DATA:  75 year old female with ankle fracture. Foot pain. Post fall. Subsequent encounter. EXAM: LEFT FOOT - COMPLETE 3+ VIEW COMPARISON:  02/01/2016 ankle films. FINDINGS: Fracture of the distal left fibula as noted on ankle films. No other fracture or dislocation noted. Plantar spur. IMPRESSION: Fracture of the distal left fibula as noted on ankle films. No other fracture or dislocation noted. Electronically Signed   By: Genia Del M.D.   On: 02/01/2016 13:12    Time Spent in minutes  30   SINGH,PRASHANT K M.D on 02/11/2016 at 9:25 AM  Between 7am to 7pm - Pager - [phone]  After 7pm go to [email or web address] - password [facility]  [facility] -  Office  [phone]

## 2016-02-11 NOTE — NC FL2 (Signed)
[place] LEVEL OF CARE SCREENING TOOL     IDENTIFICATION  Patient Name: Megan [name] Birthdate: 1941-04-02 Sex: female Admission Date (Current Location): 02/04/2016  [facility] and [place] Number:  Herbalist and Address:  [facility] [place], [place] [address], [place], [place] [address]      Provider Number: [id number]  Attending Physician Name and Address:  Thurnell Lose, MD  Relative Name and Phone Number:       Current Level of Care: Hospital Recommended Level of Care: [place] Prior Approval Number:    Date Approved/Denied:   PASRR Number: [id number]  Discharge Plan: SNF    Current Diagnoses: Patient Active Problem List   Diagnosis Date Noted  . AKI (acute kidney injury) ([place]) 02/05/2016  . Abdominal pain   . Abnormal finding on GI tract imaging   . Diarrhea   . Hematochezia   . Acute kidney injury ([place]) 02/04/2016  . Acute colitis 02/04/2016  . DM type 2 (diabetes mellitus, type 2) ([place]) 02/04/2016  . RLS (restless legs syndrome) 12/11/2015  . Diarrhea due to drug 08/11/2015  . Liver dysfunction 08/11/2015  . Dysuria 07/07/2015  . Lumbago 05/06/2015  . Acute bronchitis 05/06/2015  . Hot flashes 04/14/2015  . Peripheral edema 04/14/2015  . Hypokalemia 04/14/2015  . Rheumatoid arthritis ([place]) 04/14/2015  . Encounter for antineoplastic immunotherapy 04/06/2015  . Emphysema of lung ([place]) 11/26/2014  . Hyperglycemia due to type 2 diabetes mellitus ([place]) 11/10/2014  . Radiation-induced esophageal stricture 10/01/2014  . Tachycardia 08/27/2014  . Depression 08/27/2014  . Palliative care encounter 08/21/2014  . DNR (do not resuscitate) discussion 08/21/2014  . Neuropathic pain 08/21/2014  . Cancer associated pain 07/29/2014  . Malignant cachexia ([place]) 07/29/2014  . Acute renal insufficiency 07/29/2014  . Lung collapse 07/29/2014  . Chemotherapy-induced neuropathy ([place]) 07/29/2014  . Fatigue  07/18/2014  . Neuropathy due to chemotherapeutic drug ([place]) 07/18/2014  . Fever 07/18/2014  . Dehydration 07/18/2014  . Leg cramps 07/18/2014  . Hypoalbuminemia 07/18/2014  . Thrombocytopenia, unspecified ([place]) 07/18/2014  . Hypotension 07/18/2014  . Dyspnea 04/28/2014  . Radiation esophagitis 04/10/2014  . Right-sided chest wall pain 04/01/2014  . Cancer of middle lobe of lung ([place]) 01/17/2014  . Malignant neoplasm of right upper lobe of lung ([place]) 11/28/2013  . Lung mass 11/09/2013  . Need for prophylactic vaccination and inoculation against influenza 11/09/2013    Orientation RESPIRATION BLADDER Height & Weight     Situation, Time, Self, Place  Normal Continent Weight: 155 lb 1.6 oz (70.353 kg) Height:  '5\' 6"'$  (167.6 cm)  BEHAVIORAL SYMPTOMS/MOOD NEUROLOGICAL BOWEL NUTRITION STATUS   (None)  (None) Continent  (See discharge summary)  AMBULATORY STATUS COMMUNICATION OF NEEDS Skin   Limited Assist Verbally Surgical wounds (Incision Left Ankle )                       Personal Care Assistance Level of Assistance  Bathing, Feeding, Dressing Bathing Assistance: Limited assistance Feeding assistance: Independent Dressing Assistance: Limited assistance     Functional Limitations Info  Sight, Hearing, Speech Sight Info: Adequate Hearing Info: Adequate Speech Info: Adequate    SPECIAL CARE FACTORS FREQUENCY  PT (By licensed PT), OT (By licensed OT)     PT Frequency: 5/ week OT Frequency: 5/ week            Contractures Contractures Info: Not present    Additional Factors Info  Code Status  Code Status Info: Full             Current Medications (02/11/2016):  This is the current hospital active medication list Current Facility-Administered Medications  Medication Dose Route Frequency Provider Last Rate Last Dose  . 0.9 %  sodium chloride infusion   Intravenous Continuous Newt Minion, MD   Stopped at 02/11/16 0840  . acetaminophen (TYLENOL) tablet 650 mg   650 mg Oral Q6H PRN Bonnielee Haff, MD      . darifenacin (ENABLEX) 24 hr tablet 7.5 mg  7.5 mg Oral Daily Bonnielee Haff, MD   7.5 mg at 02/11/16 0836  . gabapentin (NEURONTIN) capsule 300 mg  300 mg Oral BID Bonnielee Haff, MD   300 mg at 02/11/16 0836  . heparin injection 5,000 Units  5,000 Units Subcutaneous 3 times per day Thurnell Lose, MD   5,000 Units at 02/11/16 0602  . HYDROcodone-acetaminophen (NORCO/VICODIN) 5-325 MG per tablet 1 tablet  1 tablet Oral Q4H PRN Thurnell Lose, MD   1 tablet at 02/11/16 [id number]  . insulin aspart (novoLOG) injection 0-15 Units  0-15 Units Subcutaneous TID WC Thurnell Lose, MD   8 Units at 02/10/16 1734  . insulin aspart (novoLOG) injection 0-5 Units  0-5 Units Subcutaneous QHS Thurnell Lose, MD   5 Units at 02/10/16 2229  . insulin aspart (novoLOG) injection 5 Units  5 Units Subcutaneous TID WC Thurnell Lose, MD   5 Units at 02/11/16 1019  . insulin detemir (LEVEMIR) injection 20 Units  20 Units Subcutaneous BID Thurnell Lose, MD   20 Units at 02/11/16 1018  . ketoconazole (NIZORAL) 2 % cream 1 application  1 application Topical BID PRN Bonnielee Haff, MD      . metoprolol (LOPRESSOR) injection 5 mg  5 mg Intravenous Q4H PRN Thurnell Lose, MD      . mometasone-formoterol (DULERA) 100-5 MCG/ACT inhaler 2 puff  2 puff Inhalation BID Bonnielee Haff, MD   2 puff at 02/08/16 1013  . ondansetron (ZOFRAN) injection 4 mg  4 mg Intravenous Q6H PRN Newt Minion, MD      . saccharomyces boulardii (FLORASTOR) capsule 250 mg  250 mg Oral BID Amy S Esterwood, PA-C   250 mg at 02/11/16 0836  . sodium chloride flush (NS) 0.9 % injection 3 mL  3 mL Intravenous Q12H Bonnielee Haff, MD   3 mL at 02/11/16 0836  . Vitamin D (Ergocalciferol) (DRISDOL) capsule 50,000 Units  50,000 Units Oral Q7 days Reyne Dumas, MD   50,000 Units at 02/06/16 1354   Facility-Administered Medications Ordered in Other Encounters  Medication Dose Route Frequency Provider Last Rate  Last Dose  . sodium chloride 0.9 % injection 10 mL  10 mL Intracatheter PRN Curt Bears, MD   10 mL at 12/30/13 1121     Discharge Medications: Please see discharge summary for a list of discharge medications.  Relevant Imaging Results:  Relevant Lab Results:   Additional Information [id number]  Samule Dry, LCSW

## 2016-02-11 NOTE — Progress Notes (Signed)
Inpatient Diabetes Program Recommendations  AACE/ADA: New Consensus Statement on Inpatient Glycemic Control (2015)  Target Ranges:  Prepandial:   less than 140 mg/dL      Peak postprandial:   less than 180 mg/dL (1-2 hours)      Critically ill patients:  140 - 180 mg/dL   Results for Megan, [name] (MRN [id number]) as of 02/11/2016 12:40  Ref. Range 02/11/2016 08:25 02/11/2016 11:57  Glucose-Capillary Latest Ref Range: 65-99 mg/dL 70 284 (H)   Review of Glycemic Control  Diabetes history: DM 2 Outpatient Diabetes medications: Humalog 6-10 units TID with meals, Levemir 34 units QHS Current orders for Inpatient glycemic control: Levemir 20 units BID, Novolog Moderate + HS scale + Novolog 5 units TID meal coverage  Inpatient Diabetes Program Recommendations:  Insulin - Meal Coverage: Glucose increased significantly with meals still. Consider increasing meal coverage to Novolog 8 units TID if patient consumes at least 50% of meals.  Thanks,  Tama Headings RN, MSN, [facility] Inpatient Diabetes Coordinator Team Pager [phone] (8a-5p)

## 2016-02-12 DIAGNOSIS — N179 Acute kidney failure, unspecified: Secondary | ICD-10-CM

## 2016-02-12 DIAGNOSIS — R1013 Epigastric pain: Secondary | ICD-10-CM

## 2016-02-12 DIAGNOSIS — R933 Abnormal findings on diagnostic imaging of other parts of digestive tract: Secondary | ICD-10-CM | POA: Diagnosis not present

## 2016-02-12 DIAGNOSIS — K529 Noninfective gastroenteritis and colitis, unspecified: Secondary | ICD-10-CM | POA: Diagnosis not present

## 2016-02-12 LAB — GLUCOSE, CAPILLARY
GLUCOSE-CAPILLARY: 108 mg/dL — AB (ref 65–99)
Glucose-Capillary: 268 mg/dL — ABNORMAL HIGH (ref 65–99)

## 2016-02-12 LAB — HEMOGLOBIN A1C
HEMOGLOBIN A1C: 12.8 % — AB (ref 4.8–5.6)
MEAN PLASMA GLUCOSE: 321 mg/dL

## 2016-02-12 LAB — HEMOGLOBIN AND HEMATOCRIT, BLOOD
HCT: 32.3 % — ABNORMAL LOW (ref 36.0–46.0)
Hemoglobin: 10.5 g/dL — ABNORMAL LOW (ref 12.0–15.0)

## 2016-02-12 MED ORDER — VITAMIN D (ERGOCALCIFEROL) 1.25 MG (50000 UNIT) PO CAPS: 50000.0000 [IU] | ORAL_CAPSULE | ORAL | Status: DC

## 2016-02-12 MED ORDER — ASPIRIN EC 325 MG PO TBEC: 325.0000 mg | DELAYED_RELEASE_TABLET | Freq: Every day | ORAL | Status: DC

## 2016-02-12 MED ORDER — INSULIN ASPART 100 UNIT/ML ~~LOC~~ SOLN: 0.0000 [IU] | Freq: Three times a day (TID) | SUBCUTANEOUS | Status: DC

## 2016-02-12 MED ORDER — HYDROCODONE-ACETAMINOPHEN 5-325 MG PO TABS: 1.0000 | ORAL_TABLET | Freq: Four times a day (QID) | ORAL | Status: DC | PRN

## 2016-02-12 MED ORDER — GABAPENTIN 300 MG PO CAPS: 300.0000 mg | ORAL_CAPSULE | Freq: Two times a day (BID) | ORAL | Status: DC

## 2016-02-12 MED ORDER — SACCHAROMYCES BOULARDII 250 MG PO CAPS: 250.0000 mg | ORAL_CAPSULE | Freq: Two times a day (BID) | ORAL | Status: DC

## 2016-02-12 NOTE — Progress Notes (Signed)
Physical Therapy Treatment Patient Details Name: Megan [name] MRN: [id number] DOB: 14-Mar-1941 Today's Date: 02/12/2016    History of Present Illness Pt adm with colitis and lt ankle fx after fall due to presyncopal episode. Underwent ORIF of lt ankle on 02/10/16. PMH - rt ankle fx, DM, lung CA, RA, HTN, neuropathy    PT Comments    Pt performed increased activity and mobility.  Pt remains to require min assist.  Pt agitated with tightening of cam boot but once tightened pt pleasant and agreeable.  Pt not fitting correctly in cam boot and nurse notified.  Pt to d/c to SNF today with orders entered.    Follow Up Recommendations  SNF     Equipment Recommendations  None recommended by PT    Recommendations for Other Services       Precautions / Restrictions Precautions Precautions: Fall Required Braces or Orthoses: Other Brace/Splint Other Brace/Splint: CAM boot on when up Restrictions Weight Bearing Restrictions: Yes LLE Weight Bearing: Touchdown weight bearing    Mobility  Bed Mobility Overal bed mobility: Needs Assistance Bed Mobility: Sit to Supine       Sit to supine: Min assist   General bed mobility comments: Pt required assist to Lift B LEs into bed.  Once in bed pt able to scoot in supine with supervision for technique.   Transfers Overall transfer level: Needs assistance Equipment used: Rolling walker (2 wheeled) Transfers: Sit to/from Stand Sit to Stand: Min assist Stand pivot transfers: Min assist       General transfer comment: Pt required cues for hand placement and assist to boost hips into standing.    Ambulation/Gait Ambulation/Gait assistance: Min assist Ambulation Distance (Feet): 16 Feet Assistive device: Rolling walker (2 wheeled) Gait Pattern/deviations: Step-to pattern;Antalgic;Trunk flexed;Shuffle     General Gait Details: VCs for safety.  Pt able to adhere to TDWB to LLE.  Pt advances LLE forward with TDWB and then shuffles R step  forward with support from UEs.  Pt fatigues quickly.     Stairs            Wheelchair Mobility    Modified Rankin (Stroke Patients Only)       Balance Overall balance assessment: Needs assistance Sitting-balance support: [name] upper extremity supported;Feet supported Sitting balance-Leahy Scale: Normal     Standing balance support: Bilateral upper extremity supported Standing balance-Leahy Scale: Poor Standing balance comment: requires both hands on walker to maintain WB status                    Cognition Arousal/Alertness: Awake/alert Behavior During Therapy: WFL for tasks assessed/performed Overall Cognitive Status: Within Functional Limits for tasks assessed                      Exercises      General Comments        Pertinent Vitals/Pain Pain Assessment: 0-10 Pain Score: 8  Pain Location: L ankle Pain Descriptors / Indicators: Grimacing;Guarding;Stabbing Pain Intervention(s): Monitored during session;Repositioned;Premedicated before session    Home Living Family/patient expects to be discharged to:: Skilled nursing facility Living Arrangements: Alone                  Prior Function Level of Independence: Independent          PT Goals (current goals can now be found in the care plan section) Acute Rehab PT Goals Patient Stated Goal: Return home eventually Potential to Achieve Goals: Good Progress towards  PT goals: Progressing toward goals    Frequency  Min 3X/week    PT Plan      Co-evaluation             End of Session Equipment Utilized During Treatment: Gait belt Activity Tolerance: Patient tolerated treatment well Patient left: in chair;with call bell/phone within reach;with chair alarm set     Time: 1030-1101 PT Time Calculation (min) (ACUTE ONLY): 31 min  Charges:  $Gait Training: 8-22 mins $Therapeutic Activity: 8-22 mins                    G Codes:      Cristela Blue 02/15/2016, 11:14 AM  Governor Rooks, PTA pager [phone]

## 2016-02-12 NOTE — Progress Notes (Signed)
Patient will DC to: [facility] [place] Anticipated DC date: 02/12/16 Family notified: N/A Transport by: PTAR 2pm  CSW signing off.  Cedric Fishman, [place] Social Worker [phone]

## 2016-02-12 NOTE — Clinical Social Work Placement (Signed)
   CLINICAL SOCIAL WORK PLACEMENT  NOTE  Date:  02/12/2016  Patient Details  Name: Megan [name] MRN: [id number] Date of Birth: Nov 21, 1940  Clinical Social Work is seeking post-discharge placement for this patient at the [place] level of care (*CSW will initial, date and re-position this form in  chart as items are completed):  Yes   Patient/family provided with [facility] Work Department's list of facilities offering this level of care within the geographic area requested by the patient (or if unable, by the patient's family).  Yes   Patient/family informed of their freedom to choose among providers that offer the needed level of care, that participate in Medicare, Medicaid or managed care program needed by the patient, have an available bed and are willing to accept the patient.  Yes   Patient/family informed of [facility]'s ownership interest in [facility] and [facility], as well as of the fact that they are under no obligation to receive care at these facilities.  PASRR submitted to EDS on       PASRR number received on       Existing PASRR number confirmed on 02/11/16     FL2 transmitted to all facilities in geographic area requested by pt/family on 02/11/16     FL2 transmitted to all facilities within larger geographic area on       Patient informed that his/her managed care company has contracts with or will negotiate with certain facilities, including the following:        Yes   Patient/family informed of bed offers received.  Patient chooses bed at [facility] [place] recommends and patient chooses bed at      Patient to be transferred to [facility] [place] on 02/12/16.  Patient to be transferred to facility by PTAR     Patient family notified on 02/12/16 of transfer.  Name of family member notified:  Patient calling family     PHYSICIAN       Additional Comment:     _______________________________________________ Benard Halsted, [place] 02/12/2016, 12:46 PM

## 2016-02-12 NOTE — Progress Notes (Signed)
Report given to [facility].  Pt taken via PTAR.

## 2016-02-12 NOTE — Care Management Note (Signed)
Case Management Note  Patient Details  Name: Megan [name] MRN: [id number] Date of Birth: 13-Jul-1941  Subjective/Objective:                 Patient to discharge to SNF today as facilitated through [place].   Action/Plan:   Expected Discharge Date:                  Expected Discharge Plan:  [place]  In-House Referral:  Clinical Social Work  Discharge planning Services  CM Consult  Post Acute Care Choice:  NA Choice offered to:     DME Arranged:    DME Agency:     HH Arranged:    [place] Agency:     Status of Service:  Completed, signed off  Medicare Important Message Given:    Date Medicare IM Given:    Medicare IM give by:    Date Additional Medicare IM Given:    Additional Medicare Important Message give by:     If discussed at [place] of Stay Meetings, dates discussed:    Additional Comments:  Carles Collet, RN 02/12/2016, 12:34 PM

## 2016-02-12 NOTE — Discharge Summary (Signed)
Physician Discharge Summary   Patient ID: Megan [name] MRN: [id number] DOB/AGE: [birth year] 75 y.o.  Admit date: 02/04/2016 Discharge date: 02/12/2016  Primary Care Physician:  [name], MD  Discharge Diagnoses:    . Acute kidney injury (HCC)With hypotension  . Acute colitisWith diarrhea  . Hypotension with syncope  . Malignant neoplasm of right upper lobe of lung ([place]) . Neuropathy due to chemotherapeutic drug ([place]) . Rheumatoid arthritis ([place]) . Hypokalemia . fall with left distal fibula fracture status post surgical repair  Consults: Orthopedics, Dr. Sharol Given Gastroenterology  Recommendations for Outpatient Follow-up:  1. Continue physical therapy 2. Please repeat CBC/BMET at next visit 3. Follow hemoglobin A1c Per orthopedics recommendations Patient may be touchdown weightbearing with her fracture boot in place. She does not need to wear the boot if she is laying in bed. We will follow up in the office in 1 week.  DIET: Carb modified diet    Allergies:   Allergies  Allergen Reactions  . Carboplatin Other (See Comments)    Neuropathy  . Remicade [Infliximab] Anaphylaxis  . Hydromorphone Rash     DISCHARGE MEDICATIONS: Current Discharge Medication List    START taking these medications   Details  gabapentin (NEURONTIN) 300 MG capsule Take 1 capsule (300 mg total) by mouth 2 (two) times daily. Qty: 30 capsule, Refills: 0    insulin aspart (NOVOLOG) 100 UNIT/ML injection Inject 0-15 Units into the skin 3 (three) times daily with meals. Sliding scale  CBG 70 - 120: 0 units: CBG 121 - 150: 2 units; CBG 151 - 200: 3 units; CBG 201 - 250: 5 units; CBG 251 - 300: 8 units;CBG 301 - 350: 11 units; CBG 351 - 400: 15 units; CBG > 400 : 15 units and notify MD Qty: 10 mL, Refills: 11    saccharomyces boulardii (FLORASTOR) 250 MG capsule Take 1 capsule (250 mg total) by mouth 2 (two) times daily.    Vitamin D, Ergocalciferol, (DRISDOL) 50000 units CAPS capsule  Take 1 capsule (50,000 Units total) by mouth every 7 (seven) days. Qty: 30 capsule      CONTINUE these medications which have CHANGED   Details  HYDROcodone-acetaminophen (NORCO/VICODIN) 5-325 MG tablet Take 1-2 tablets by mouth every 6 (six) hours as needed for moderate pain. Qty: 30 tablet, Refills: 0      CONTINUE these medications which have NOT CHANGED   Details  fluticasone (CUTIVATE) 0.05 % cream Apply 1 application topically 2 (two) times daily as needed (rash).     Fluticasone-Salmeterol (ADVAIR DISKUS) 100-50 MCG/DOSE AEPB Inhale 1 puff into the lungs 2 (two) times daily. Qty: 60 each, Refills: 5    insulin detemir (LEVEMIR) 100 UNIT/ML injection Inject 34 Units into the skin at bedtime.     ketoconazole (NIZORAL) 2 % cream Apply 1 application topically 2 (two) times daily as needed for irritation.  Refills: 5    VESICARE 5 MG tablet Take 5 mg by mouth daily. Refills: 6    Mesalamine (ASACOL HD) 800 MG TBEC Take 3 tablets (2,400 mg total) by mouth 2 (two) times daily. Qty: 180 tablet, Refills: 2      STOP taking these medications     Gabapentin Enacarbil (HORIZANT) 600 MG TBCR      HUMALOG KWIKPEN 100 UNIT/ML KiwkPen      losartan-hydrochlorothiazide (HYZAAR) 100-25 MG tablet      potassium chloride SA (K-DUR,KLOR-CON) 20 MEQ tablet          Brief H and P: For  complete details please refer to admission H and P, but in brief, 75 y.o. female with a past medical history of diabetes on insulin, stage III lung cancer followed by oncology, history of rheumatoid arthritis, history of orthostatic hypotension who was in her usual state of health till she had an episode of syncope on 02/01/2016, she hurt her left ankle and fractured it, she had a similar episode a few months ago and she had fractured her right ankle, she has had multiple episodes of syncopal events in the past. Upon admission she also had diffuse diarrhea and a GI workup was done which included a  colonoscopy all suggestive of infectious colitis which has been adequately treated.  Her diarrhea has resolved, she was also seen by orthopedic surgeon Dr. Sharol Given and she underwent surgical correction of her left similar fracture on 02/10/2016. She lives alone and will require SNF. Due to her recurrent syncope 30 day event monitor has also been arranged.  Hospital Course:   Acute diffuse colitis with diarrhea. Mild hematochezia, stool guaiac positive-improved - Diarrhea has improved, GI pathogen panel negative, H&H stable. No further GI bleed.  - Patient was placed on IV ciprofloxacin and Flagyl, she has completed the full course.  - Patient underwent colonoscopy on 02/07/2016. Colonoscopy suggested most likely of infectious colitis. Outpatient follow-up with [facility] in 1-2 weeks.   ARF due to dehydration with hypotension  - all caused by diarrhea and decreased oral intake along with ARB.  - Resolved with IV fluid hydration.  - Discontinued ARB/HCTZ upon discharge as she has recurrent episodes of syncope at home which sound like orthostatic episodes.  Fall with left distal fibula fracture.  - Orthopedics was consulted, patient underwent surgical correction by Dr. Sharol Given on 02/10/2016 - Orthopedics recommended touch toe weightbearing on left leg - she is supposed to wear the cam boot when out of bed  History of lung cancer. Currently off treatment, follow with oncology post discharge.   Multiple episodes of syncope. Most likely due to hypotension, hold ARB upon discharge -Patient will have 30 day event monitor and outpatient cardiology follow-up arranged  Hypocalcemia. Mild, due to GI losses, was stable upon admission. Replaced and stable.    DM type II  - Continue Levemir, sliding scale insulin   Day of Discharge BP 123/66 mmHg  Pulse 93  Temp(Src) 98.1 F (36.7 C) (Oral)  Resp 16  Ht '5\' 6"'$  (1.676 m)  Wt 70.353 kg (155 lb 1.6 oz)  BMI 25.05 kg/m2  SpO2 97%  Physical  Exam: General: Alert and awake oriented x3 not in any acute distress. HEENT: anicteric sclera, pupils reactive to light and accommodation CVS: S1-S2 clear no murmur rubs or gallops Chest: clear to auscultation bilaterally, no wheezing rales or rhonchi Abdomen: soft nontender, nondistended, normal bowel sounds Extremities: no cyanosis, clubbing, left lower extremity in immobilizer Neuro: Cranial nerves II-XII intact, no focal neurological deficits   The results of significant diagnostics from this hospitalization (including imaging, microbiology, ancillary and laboratory) are listed below for reference.    LAB RESULTS: Basic Metabolic Panel:  Recent Labs Lab 02/09/16 0524 02/11/16 0458  NA 135 141  K 4.5 3.8  CL 102 103  CO2 22 28  GLUCOSE 521* 149*  BUN 10 8  CREATININE 0.89 0.72  CALCIUM 7.7* 8.1*   Liver Function Tests:  Recent Labs Lab 02/08/16 0530 02/09/16 0524  AST 17 18  ALT 9* 12*  ALKPHOS 88 112  BILITOT 0.5 1.0  PROT 4.4*  4.7*  ALBUMIN 2.3* 2.3*   No results for input(s): LIPASE, AMYLASE in the last 168 hours. No results for input(s): AMMONIA in the last 168 hours. CBC:  Recent Labs Lab 02/08/16 0530 02/11/16 0458 02/12/16 0416  WBC 6.7 7.5  --   HGB 11.5* 10.6* 10.5*  HCT 34.5* 33.3* 32.3*  MCV 89.6 91.2  --   PLT 198 198  --    Cardiac Enzymes: No results for input(s): CKTOTAL, CKMB, CKMBINDEX, TROPONINI in the last 168 hours. BNP: Invalid input(s): POCBNP CBG:  Recent Labs Lab 02/11/16 2117 02/12/16 0822  GLUCAP 231* 108*    Significant Diagnostic Studies:  Ct Abdomen Pelvis Wo Contrast  02/04/2016  CLINICAL DATA:  Lower abdominal pain with gas and bloating, diarrhea for 4 days, bright red blood in stool, rectal bleeding, hypertension, rheumatoid arthritis, lung cancer post radiation therapy, diabetes mellitus, former smoker EXAM: CT ABDOMEN AND PELVIS WITHOUT CONTRAST TECHNIQUE: Multidetector CT imaging of the abdomen and pelvis was  performed following the standard protocol without IV contrast. Sagittal and coronal MPR images reconstructed from axial data set. COMPARISON:  PET-CT 11/14/2013, chest CT 11/30/2015 FINDINGS: Soft tissue density adjacent to RIGHT heart border likely reflects RIGHT middle lobe atelectasis, unchanged. Calcified granuloma LEFT lower lobe image 6 with additional tiny LEFT lower lobe nodule image 9, stable. Calcified granulomata within spleen. 9 mm cyst LEFT lobe liver image 11 unchanged. Questionable 7 mm subcapsular lesion lateral RIGHT lobe liver image 34. Lesion anterior lateral segment LEFT lobe liver 19 x 13 mm image 19 previously 14 x 9 mm. Gallbladder surgically absent. Remainder of liver, spleen, pancreas, kidneys, and adrenal glands normal. Wall thickening of ascending colon through distal transverse colon consistent with colitis. Associated significant hyperemia of transverse mesocolon. Cecum and distal colon appear unaffected. Stomach and small bowel loops normal appearance. Normal appendix Uterus surgically absent with nonvisualization of ovaries. No additional mass, adenopathy, free air free fluid. Bladder and ureters normal appearance. Scattered atherosclerotic calcifications. No acute osseous findings. IMPRESSION: Diffuse wall thickening of the ascending through distal transverse colon with associated hyperemia of the mesocolon compatible with colitis ; differential diagnosis includes infection and inflammatory bowel disease, ischemia less likely with this distribution though not excluded. Interval increase in size of a lesion at the lateral segment LEFT lobe liver question hepatic metastasis. Electronically Signed   By: Lavonia Dana M.D.   On: 02/04/2016 16:47   Dg Chest Port 1 View  02/04/2016  CLINICAL DATA:  Cough, hypotension and diarrhea. History of squamous cell carcinoma of the lung. EXAM: PORTABLE CHEST 1 VIEW COMPARISON:  CT of the chest on 11/30/2015 FINDINGS: There is chronic volume loss of  the right lung with dense chronic atelectasis and consolidation of the right middle lobe. No edema, infiltrate or pneumothorax identified. No pleural effusions are seen. The heart size is normal. IMPRESSION: Chronic right middle lobe atelectasis and consolidation. Electronically Signed   By: Aletta Edouard M.D.   On: 02/04/2016 22:02    2D ECHO:   Disposition and Follow-up: Discharge Instructions    Diet Carb Modified    Complete by:  As directed      Discharge instructions    Complete by:  As directed   It is VERY IMPORTANT that you follow up with a PCP on a regular basis.  Check your blood glucoses before each meal and at bedtime and maintain a log of your readings.  Bring this log with you when you follow up with your PCP so  that he or she can adjust your insulin at your follow up visit.     Elevate operative extremity    Complete by:  As directed      Increase activity slowly    Complete by:  As directed      Touch down weight bearing    Complete by:  As directed   Laterality:  left  Extremity:  Lower            DISPOSITION: SNF   DISCHARGE FOLLOW-UP Follow-up Information    Follow up with [facility] On 02/16/2016.   Specialty:  Cardiology   Why:  Come in to get event monitor place at 10:15 am for a 10:30 am appointment.   Contact information:   [address], [place] [phone]      Follow up with Jettie Booze., MD On 03/22/2016.   Specialties:  Cardiology, Radiology, Interventional Cardiology   Why:  Please arrive at 1:45 pm for a 2:00 pm appointment.   Contact information:   [address] [place] [address] [phone]       Follow up with Newt Minion, MD In 1 week.   Specialty:  Orthopedic Surgery   Why:  for hospital follow-up   Contact information:   [place] [address] [phone]       Follow up with Nelida Meuse III, MD. Schedule an  appointment as soon as possible for a visit in 2 weeks.   Specialty:  Gastroenterology   Why:  for hospital follow-up   Contact information:   [place] [address] [phone]        Time spent on Discharge: 11mns   Signed:   RAI,RIPUDEEP M.D. [facility] 02/12/2016, 11:29 AM Pager: [phone]

## 2016-02-12 NOTE — Evaluation (Signed)
Occupational Therapy Evaluation Patient Details Name: Megan [name] MRN: [id number] DOB: Mar 17, 1941 Today's Date: 02/12/2016    History of Present Illness Pt adm with colitis and lt ankle fx after fall due to presyncopal episode. Underwent ORIF of lt ankle on 02/10/16. PMH - rt ankle fx, DM, lung CA, RA, HTN, neuropathy   Clinical Impression   Pt was independent prior to admission. She presents with generalized weakness and pain managed with morphine. She requires set up to moderate assistance for ADL. Pt lives alone and will require post acute rehab in SNF prior to return home. Will defer further OT to SNF.    Follow Up Recommendations  SNF;Supervision/Assistance - 24 hour    Equipment Recommendations       Recommendations for Other Services       Precautions / Restrictions Precautions Precautions: Fall Required Braces or Orthoses: Other Brace/Splint Other Brace/Splint: CAM boot on when up Restrictions Weight Bearing Restrictions: Yes LLE Weight Bearing: Touchdown weight bearing      Mobility Bed Mobility               General bed mobility comments: pt in chair  Transfers Overall transfer level: Needs assistance Equipment used: Rolling walker (2 wheeled) Transfers: Sit to/from [facility] Sit to Stand: Min assist Stand pivot transfers: Min assist            Balance     Sitting balance-Leahy Scale: Normal       Standing balance-Leahy Scale: Poor Standing balance comment: requires both hands on walker to maintain WB status                            ADL Overall ADL's : Needs assistance/impaired Eating/Feeding: Independent;Sitting   Grooming: Wash/dry hands;Wash/dry face;Brushing hair;Sitting;Set up   Upper Body Bathing: Set up;Sitting   Lower Body Bathing: Sit to/from stand;Moderate assistance;Sitting/lateral leans   Upper Body Dressing : Set up;Sitting   Lower Body Dressing: Sit to/from stand;Moderate  assistance;Sitting/lateral leans   Toilet Transfer: Minimal assistance;Stand-pivot;BSC   Toileting- Clothing Manipulation and Hygiene: Supervision/safety;Sitting/lateral lean               Vision     Perception     Praxis      Pertinent Vitals/Pain Pain Assessment: No/denies pain Pain Intervention(s): Premedicated before session     Hand Dominance Right   Extremity/Trunk Assessment Upper Extremity Assessment Upper Extremity Assessment: Overall WFL for tasks assessed   Lower Extremity Assessment Lower Extremity Assessment: Defer to PT evaluation   Cervical / Trunk Assessment Cervical / Trunk Assessment: Normal   Communication Communication Communication: HOH   Cognition Arousal/Alertness: Awake/alert Behavior During Therapy: WFL for tasks assessed/performed Overall Cognitive Status: Within Functional Limits for tasks assessed                     General Comments       Exercises       Shoulder Instructions      Home Living Family/patient expects to be discharged to:: Skilled nursing facility Living Arrangements: Alone                                      Prior Functioning/Environment Level of Independence: Independent             OT Diagnosis: Generalized weakness;Acute pain   OT Problem List:  OT Treatment/Interventions:      OT Goals(Current goals can be found in the care plan section) Acute Rehab OT Goals Patient Stated Goal: Return home eventually  OT Frequency:     Barriers to D/C:            Co-evaluation              End of Session Equipment Utilized During Treatment: Gait belt;Rolling walker (CAM boot)  Activity Tolerance: Patient tolerated treatment well Patient left: in chair;with call bell/phone within reach;with chair alarm set   Time: 0939-1000 OT Time Calculation (min): 21 min Charges:  OT General Charges $OT Visit: 1 Procedure OT Evaluation $OT Eval Low Complexity: 1  Procedure $OT Eval Moderate Complexity: 1 Procedure G-Codes: OT G-codes **NOT FOR INPATIENT CLASS** Functional Assessment Tool Used: clinical judgement Functional Limitation: Self care Self Care Current Status (Y0737): At least 40 percent but less than 60 percent impaired, limited or restricted Self Care Goal Status (T0626): At least 40 percent but less than 60 percent impaired, limited or restricted Self Care Discharge Status [phone]): At least 40 percent but less than 60 percent impaired, limited or restricted  Malka So 02/12/2016, 10:08 AM  [phone]

## 2016-02-16 ENCOUNTER — Ambulatory Visit (INDEPENDENT_AMBULATORY_CARE_PROVIDER_SITE_OTHER): Payer: Medicare Other

## 2016-02-16 DIAGNOSIS — R55 Syncope and collapse: Secondary | ICD-10-CM | POA: Diagnosis not present

## 2016-02-17 ENCOUNTER — Inpatient Hospital Stay (HOSPITAL_COMMUNITY)
Admission: EM | Admit: 2016-02-17 | Discharge: 2016-02-20 | DRG: 637 | Disposition: A | Discharge status: Skilled Nursing Facility | Payer: Medicare Other | Attending: Internal Medicine | Admitting: Internal Medicine

## 2016-02-17 ENCOUNTER — Emergency Department (HOSPITAL_COMMUNITY): Payer: Medicare Other

## 2016-02-17 ENCOUNTER — Encounter (HOSPITAL_COMMUNITY): Payer: Self-pay | Admitting: Emergency Medicine

## 2016-02-17 ENCOUNTER — Other Ambulatory Visit: Payer: Self-pay

## 2016-02-17 DIAGNOSIS — R451 Restlessness and agitation: Secondary | ICD-10-CM | POA: Diagnosis present

## 2016-02-17 DIAGNOSIS — Z85118 Personal history of other malignant neoplasm of bronchus and lung: Secondary | ICD-10-CM | POA: Diagnosis not present

## 2016-02-17 DIAGNOSIS — Z794 Long term (current) use of insulin: Secondary | ICD-10-CM

## 2016-02-17 DIAGNOSIS — M069 Rheumatoid arthritis, unspecified: Secondary | ICD-10-CM | POA: Diagnosis present

## 2016-02-17 DIAGNOSIS — F4489 Other dissociative and conversion disorders: Secondary | ICD-10-CM | POA: Diagnosis present

## 2016-02-17 DIAGNOSIS — Z79899 Other long term (current) drug therapy: Secondary | ICD-10-CM

## 2016-02-17 DIAGNOSIS — Z7951 Long term (current) use of inhaled steroids: Secondary | ICD-10-CM | POA: Diagnosis not present

## 2016-02-17 DIAGNOSIS — K219 Gastro-esophageal reflux disease without esophagitis: Secondary | ICD-10-CM | POA: Diagnosis present

## 2016-02-17 DIAGNOSIS — E1010 Type 1 diabetes mellitus with ketoacidosis without coma: Secondary | ICD-10-CM

## 2016-02-17 DIAGNOSIS — N179 Acute kidney failure, unspecified: Secondary | ICD-10-CM | POA: Diagnosis present

## 2016-02-17 DIAGNOSIS — C3411 Malignant neoplasm of upper lobe, right bronchus or lung: Secondary | ICD-10-CM | POA: Diagnosis not present

## 2016-02-17 DIAGNOSIS — C787 Secondary malignant neoplasm of liver and intrahepatic bile duct: Secondary | ICD-10-CM | POA: Diagnosis present

## 2016-02-17 DIAGNOSIS — E0811 Diabetes mellitus due to underlying condition with ketoacidosis with coma: Secondary | ICD-10-CM | POA: Insufficient documentation

## 2016-02-17 DIAGNOSIS — Z87891 Personal history of nicotine dependence: Secondary | ICD-10-CM | POA: Diagnosis not present

## 2016-02-17 DIAGNOSIS — L409 Psoriasis, unspecified: Secondary | ICD-10-CM | POA: Diagnosis present

## 2016-02-17 DIAGNOSIS — E871 Hypo-osmolality and hyponatremia: Secondary | ICD-10-CM | POA: Diagnosis present

## 2016-02-17 DIAGNOSIS — Z8249 Family history of ischemic heart disease and other diseases of the circulatory system: Secondary | ICD-10-CM | POA: Diagnosis not present

## 2016-02-17 DIAGNOSIS — E0910 Drug or chemical induced diabetes mellitus with ketoacidosis without coma: Secondary | ICD-10-CM | POA: Diagnosis not present

## 2016-02-17 DIAGNOSIS — I10 Essential (primary) hypertension: Secondary | ICD-10-CM | POA: Diagnosis present

## 2016-02-17 DIAGNOSIS — Z823 Family history of stroke: Secondary | ICD-10-CM

## 2016-02-17 DIAGNOSIS — J439 Emphysema, unspecified: Secondary | ICD-10-CM | POA: Diagnosis present

## 2016-02-17 DIAGNOSIS — E1310 Other specified diabetes mellitus with ketoacidosis without coma: Secondary | ICD-10-CM | POA: Diagnosis present

## 2016-02-17 DIAGNOSIS — E119 Type 2 diabetes mellitus without complications: Secondary | ICD-10-CM

## 2016-02-17 DIAGNOSIS — J9600 Acute respiratory failure, unspecified whether with hypoxia or hypercapnia: Secondary | ICD-10-CM | POA: Diagnosis present

## 2016-02-17 DIAGNOSIS — E876 Hypokalemia: Secondary | ICD-10-CM | POA: Diagnosis not present

## 2016-02-17 DIAGNOSIS — E0810 Diabetes mellitus due to underlying condition with ketoacidosis without coma: Secondary | ICD-10-CM | POA: Diagnosis not present

## 2016-02-17 DIAGNOSIS — Z791 Long term (current) use of non-steroidal anti-inflammatories (NSAID): Secondary | ICD-10-CM | POA: Diagnosis not present

## 2016-02-17 DIAGNOSIS — Z923 Personal history of irradiation: Secondary | ICD-10-CM

## 2016-02-17 DIAGNOSIS — D72829 Elevated white blood cell count, unspecified: Secondary | ICD-10-CM

## 2016-02-17 DIAGNOSIS — Z888 Allergy status to other drugs, medicaments and biological substances status: Secondary | ICD-10-CM | POA: Diagnosis not present

## 2016-02-17 DIAGNOSIS — E860 Dehydration: Secondary | ICD-10-CM | POA: Diagnosis present

## 2016-02-17 DIAGNOSIS — R0602 Shortness of breath: Secondary | ICD-10-CM | POA: Diagnosis present

## 2016-02-17 DIAGNOSIS — G9340 Encephalopathy, unspecified: Secondary | ICD-10-CM | POA: Diagnosis not present

## 2016-02-17 DIAGNOSIS — R933 Abnormal findings on diagnostic imaging of other parts of digestive tract: Secondary | ICD-10-CM | POA: Diagnosis present

## 2016-02-17 DIAGNOSIS — G9341 Metabolic encephalopathy: Secondary | ICD-10-CM | POA: Diagnosis present

## 2016-02-17 LAB — BASIC METABOLIC PANEL
ANION GAP: 13 (ref 5–15)
ANION GAP: 13 (ref 5–15)
Anion gap: 17 — ABNORMAL HIGH (ref 5–15)
BUN: 22 mg/dL — AB (ref 6–20)
BUN: 25 mg/dL — ABNORMAL HIGH (ref 6–20)
BUN: 20 mg/dL (ref 6–20)
BUN: 16 mg/dL (ref 6–20)
BUN: 15 mg/dL (ref 6–20)
CHLORIDE: 90 mmol/L — AB (ref 101–111)
CHLORIDE: 101 mmol/L (ref 101–111)
CHLORIDE: 107 mmol/L (ref 101–111)
CHLORIDE: 108 mmol/L (ref 101–111)
CHLORIDE: 106 mmol/L (ref 101–111)
CO2: 12 mmol/L — AB (ref 22–32)
CO2: 16 mmol/L — AB (ref 22–32)
CO2: 17 mmol/L — AB (ref 22–32)
CREATININE: 1.9 mg/dL — AB (ref 0.44–1.00)
CREATININE: 1.45 mg/dL — AB (ref 0.44–1.00)
CREATININE: 1.16 mg/dL — AB (ref 0.44–1.00)
CREATININE: 1.05 mg/dL — AB (ref 0.44–1.00)
Calcium: 9.1 mg/dL (ref 8.9–10.3)
Calcium: 7.6 mg/dL — ABNORMAL LOW (ref 8.9–10.3)
Calcium: 7.1 mg/dL — ABNORMAL LOW (ref 8.9–10.3)
Calcium: 7 mg/dL — ABNORMAL LOW (ref 8.9–10.3)
Calcium: 7 mg/dL — ABNORMAL LOW (ref 8.9–10.3)
Creatinine, Ser: 1.85 mg/dL — ABNORMAL HIGH (ref 0.44–1.00)
GFR calc Af Amer: 30 mL/min — ABNORMAL LOW (ref >60)
GFR calc non Af Amer: 26 mL/min — ABNORMAL LOW (ref >60)
GFR calc non Af Amer: 25 mL/min — ABNORMAL LOW (ref >60)
GFR calc non Af Amer: 35 mL/min — ABNORMAL LOW (ref >60)
GFR calc non Af Amer: 45 mL/min — ABNORMAL LOW (ref >60)
GFR calc non Af Amer: 51 mL/min — ABNORMAL LOW (ref >60)
GFR, EST AFRICAN AMERICAN: 29 mL/min — AB (ref >60)
GFR, EST AFRICAN AMERICAN: 40 mL/min — AB (ref >60)
GFR, EST AFRICAN AMERICAN: 52 mL/min — AB (ref >60)
GFR, EST AFRICAN AMERICAN: 59 mL/min — AB (ref >60)
GLUCOSE: 775 mg/dL — AB (ref 65–99)
Glucose, Bld: 689 mg/dL (ref 65–99)
Glucose, Bld: 296 mg/dL — ABNORMAL HIGH (ref 65–99)
Glucose, Bld: 186 mg/dL — ABNORMAL HIGH (ref 65–99)
Glucose, Bld: 186 mg/dL — ABNORMAL HIGH (ref 65–99)
POTASSIUM: 4.9 mmol/L (ref 3.5–5.1)
POTASSIUM: 4.6 mmol/L (ref 3.5–5.1)
POTASSIUM: 3.3 mmol/L — AB (ref 3.5–5.1)
POTASSIUM: 3.7 mmol/L (ref 3.5–5.1)
POTASSIUM: 3.6 mmol/L (ref 3.5–5.1)
SODIUM: 136 mmol/L (ref 135–145)
SODIUM: 137 mmol/L (ref 135–145)
SODIUM: 136 mmol/L (ref 135–145)
Sodium: 127 mmol/L — ABNORMAL LOW (ref 135–145)
Sodium: 130 mmol/L — ABNORMAL LOW (ref 135–145)

## 2016-02-17 LAB — GLUCOSE, CAPILLARY
GLUCOSE-CAPILLARY: 182 mg/dL — AB (ref 65–99)
GLUCOSE-CAPILLARY: 163 mg/dL — AB (ref 65–99)
Glucose-Capillary: 324 mg/dL — ABNORMAL HIGH (ref 65–99)
Glucose-Capillary: 217 mg/dL — ABNORMAL HIGH (ref 65–99)
Glucose-Capillary: 204 mg/dL — ABNORMAL HIGH (ref 65–99)
Glucose-Capillary: 195 mg/dL — ABNORMAL HIGH (ref 65–99)
Glucose-Capillary: 149 mg/dL — ABNORMAL HIGH (ref 65–99)

## 2016-02-17 LAB — CBC WITH DIFFERENTIAL/PLATELET
BASOS ABS: 0 10*3/uL (ref 0.0–0.1)
BASOS PCT: 0 %
Band Neutrophils: 6 %
Blasts: 0 %
EOS PCT: 0 %
Eosinophils Absolute: 0 10*3/uL (ref 0.0–0.7)
HEMATOCRIT: 41.5 % (ref 36.0–46.0)
Hemoglobin: 13.2 g/dL (ref 12.0–15.0)
LYMPHS ABS: 2.2 10*3/uL (ref 0.7–4.0)
Lymphocytes Relative: 16 %
MCH: 30.3 pg (ref 26.0–34.0)
MCHC: 31.8 g/dL (ref 30.0–36.0)
MCV: 95.2 fL (ref 78.0–100.0)
METAMYELOCYTES PCT: 15 %
MONO ABS: 1 10*3/uL (ref 0.1–1.0)
MONOS PCT: 7 %
Myelocytes: 17 %
NEUTROS ABS: 10.7 10*3/uL — AB (ref 1.7–7.7)
Neutrophils Relative %: 37 %
Other: 0 %
Platelets: 589 10*3/uL — ABNORMAL HIGH (ref 150–400)
Promyelocytes Absolute: 2 %
RBC: 4.36 MIL/uL (ref 3.87–5.11)
RDW: 15 % (ref 11.5–15.5)
WBC: 13.9 10*3/uL — AB (ref 4.0–10.5)
nRBC: 0 /100 WBC

## 2016-02-17 LAB — PHOSPHORUS
PHOSPHORUS: 2.1 mg/dL — AB (ref 2.5–4.6)
Phosphorus: 6.9 mg/dL — ABNORMAL HIGH (ref 2.5–4.6)
Phosphorus: 3 mg/dL (ref 2.5–4.6)

## 2016-02-17 LAB — TROPONIN I
TROPONIN I: 0.03 ng/mL (ref <0.031)
TROPONIN I: 0.03 ng/mL (ref <0.031)
TROPONIN I: 0.04 ng/mL — AB (ref <0.031)
TROPONIN I: 0.07 ng/mL — AB (ref <0.031)

## 2016-02-17 LAB — I-STAT CG4 LACTIC ACID, ED
LACTIC ACID, VENOUS: 3.65 mmol/L — AB (ref 0.5–2.0)
Lactic Acid, Venous: 1.56 mmol/L (ref 0.5–2.0)

## 2016-02-17 LAB — BLOOD GAS, VENOUS

## 2016-02-17 LAB — I-STAT ARTERIAL BLOOD GAS, ED
ACID-BASE DEFICIT: 22 mmol/L — AB (ref 0.0–2.0)
Bicarbonate: 5.4 mEq/L — ABNORMAL LOW (ref 20.0–24.0)
O2 Saturation: 98 %
PCO2 ART: 17.4 mmHg — AB (ref 35.0–45.0)
PO2 ART: 127 mmHg — AB (ref 80.0–100.0)
Patient temperature: 98.6
TCO2: 6 mmol/L (ref 0–100)
pH, Arterial: 7.103 — CL (ref 7.350–7.450)

## 2016-02-17 LAB — HEPATIC FUNCTION PANEL
ALBUMIN: 2.5 g/dL — AB (ref 3.5–5.0)
ALT: 13 U/L — ABNORMAL LOW (ref 14–54)
AST: 25 U/L (ref 15–41)
Alkaline Phosphatase: 126 U/L (ref 38–126)
BILIRUBIN DIRECT: 0.1 mg/dL (ref 0.1–0.5)
Indirect Bilirubin: 1.9 mg/dL — ABNORMAL HIGH (ref 0.3–0.9)
Total Bilirubin: 2 mg/dL — ABNORMAL HIGH (ref 0.3–1.2)
Total Protein: 5.6 g/dL — ABNORMAL LOW (ref 6.5–8.1)

## 2016-02-17 LAB — POCT I-STAT 3, ART BLOOD GAS (G3+)
Acid-base deficit: 12 mmol/L — ABNORMAL HIGH (ref 0.0–2.0)
Acid-base deficit: 9 mmol/L — ABNORMAL HIGH (ref 0.0–2.0)
Acid-base deficit: 4 mmol/L — ABNORMAL HIGH (ref 0.0–2.0)
BICARBONATE: 15.4 meq/L — AB (ref 20.0–24.0)
Bicarbonate: 14 mEq/L — ABNORMAL LOW (ref 20.0–24.0)
Bicarbonate: 19.4 mEq/L — ABNORMAL LOW (ref 20.0–24.0)
O2 SAT: 96 %
O2 SAT: 97 %
O2 Saturation: 96 %
PCO2 ART: 29.8 mmHg — AB (ref 35.0–45.0)
PCO2 ART: 26 mmHg — AB (ref 35.0–45.0)
PCO2 ART: 30.4 mmHg — AB (ref 35.0–45.0)
PH ART: 7.413 (ref 7.350–7.450)
PO2 ART: 89 mmHg (ref 80.0–100.0)
PO2 ART: 84 mmHg (ref 80.0–100.0)
Patient temperature: 97.8
Patient temperature: 97.3
Patient temperature: 98.5
TCO2: 15 mmol/L (ref 0–100)
TCO2: 16 mmol/L (ref 0–100)
TCO2: 20 mmol/L (ref 0–100)
pH, Arterial: 7.279 — ABNORMAL LOW (ref 7.350–7.450)
pH, Arterial: 7.379 (ref 7.350–7.450)
pO2, Arterial: 77 mmHg — ABNORMAL LOW (ref 80.0–100.0)

## 2016-02-17 LAB — I-STAT VENOUS BLOOD GAS, ED
Acid-base deficit: 28 mmol/L — ABNORMAL HIGH (ref 0.0–2.0)
Bicarbonate: 4 mEq/L — ABNORMAL LOW (ref 20.0–24.0)
O2 Saturation: 77 %
pCO2, Ven: 19.7 mmHg — ABNORMAL LOW (ref 45.0–50.0)
pH, Ven: 6.92 — CL (ref 7.250–7.300)
pO2, Ven: 67 mmHg — ABNORMAL HIGH (ref 31.0–45.0)

## 2016-02-17 LAB — CBG MONITORING, ED
GLUCOSE-CAPILLARY: 501 mg/dL — AB (ref 65–99)
GLUCOSE-CAPILLARY: 461 mg/dL — AB (ref 65–99)
GLUCOSE-CAPILLARY: 336 mg/dL — AB (ref 65–99)
Glucose-Capillary: >600 mg/dL (ref 65–99)

## 2016-02-17 LAB — URINALYSIS, ROUTINE W REFLEX MICROSCOPIC
BILIRUBIN URINE: NEGATIVE
Glucose, UA: >1000 mg/dL — AB
LEUKOCYTES UA: NEGATIVE
NITRITE: NEGATIVE
PH: 5 (ref 5.0–8.0)
PROTEIN: 30 mg/dL — AB
Specific Gravity, Urine: 1.023 (ref 1.005–1.030)

## 2016-02-17 LAB — MAGNESIUM
MAGNESIUM: 2.5 mg/dL — AB (ref 1.7–2.4)
Magnesium: 2.1 mg/dL (ref 1.7–2.4)
Magnesium: 1.6 mg/dL — ABNORMAL LOW (ref 1.7–2.4)

## 2016-02-17 LAB — URINE MICROSCOPIC-ADD ON: RBC / HPF: NONE SEEN RBC/hpf (ref 0–5)

## 2016-02-17 LAB — LIPASE, BLOOD: Lipase: 14 U/L (ref 11–51)

## 2016-02-17 LAB — BETA-HYDROXYBUTYRIC ACID: Beta-Hydroxybutyric Acid: >4.5 mmol/L — ABNORMAL HIGH (ref 0.05–0.27)

## 2016-02-17 LAB — MRSA PCR SCREENING: MRSA by PCR: NEGATIVE

## 2016-02-17 MED ORDER — IPRATROPIUM-ALBUTEROL 0.5-2.5 (3) MG/3ML IN SOLN: 3.0000 mL | RESPIRATORY_TRACT | Status: DC | PRN

## 2016-02-17 MED ORDER — MAGNESIUM SULFATE 4 GM/100ML IV SOLN
4.0000 g | Freq: Once | INTRAVENOUS | Status: AC
  Administered 2016-02-17: 4 g via INTRAVENOUS
  Filled 2016-02-17: qty 100

## 2016-02-17 MED ORDER — STERILE WATER FOR INJECTION IV SOLN
INTRAVENOUS | Status: DC
  Administered 2016-02-17 – 2016-02-18 (×3): via INTRAVENOUS
  Filled 2016-02-17 (×9): qty 850

## 2016-02-17 MED ORDER — SODIUM CHLORIDE 0.9 % IV BOLUS (SEPSIS)
750.0000 mL | Freq: Once | INTRAVENOUS | Status: AC
  Administered 2016-02-17: 750 mL via INTRAVENOUS

## 2016-02-17 MED ORDER — SACCHAROMYCES BOULARDII 250 MG PO CAPS
250.0000 mg | ORAL_CAPSULE | Freq: Two times a day (BID) | ORAL | Status: DC
  Administered 2016-02-18 – 2016-02-20 (×5): 250 mg via ORAL
  Filled 2016-02-17 (×6): qty 1

## 2016-02-17 MED ORDER — SODIUM BICARBONATE 8.4 % IV SOLN
50.0000 meq | Freq: Once | INTRAVENOUS | Status: AC
  Administered 2016-02-17: 50 meq via INTRAVENOUS

## 2016-02-17 MED ORDER — SODIUM CHLORIDE 0.9 % IV SOLN: 1000.0000 mL | INTRAVENOUS | Status: DC

## 2016-02-17 MED ORDER — PIPERACILLIN-TAZOBACTAM 3.375 G IVPB 30 MIN
3.3750 g | Freq: Once | INTRAVENOUS | Status: AC
  Administered 2016-02-17: 3.375 g via INTRAVENOUS
  Filled 2016-02-17: qty 50

## 2016-02-17 MED ORDER — DEXTROSE-NACL 5-0.45 % IV SOLN
INTRAVENOUS | Status: DC
  Administered 2016-02-17: 75 mL/h via INTRAVENOUS

## 2016-02-17 MED ORDER — ACETAMINOPHEN 650 MG RE SUPP: 650.0000 mg | Freq: Four times a day (QID) | RECTAL | Status: DC | PRN

## 2016-02-17 MED ORDER — POTASSIUM CHLORIDE 10 MEQ/100ML IV SOLN
10.0000 meq | INTRAVENOUS | Status: AC
  Administered 2016-02-17 (×3): 10 meq via INTRAVENOUS
  Filled 2016-02-17 (×3): qty 100

## 2016-02-17 MED ORDER — SODIUM CHLORIDE 0.9 % IV SOLN
1000.0000 mL | Freq: Once | INTRAVENOUS | Status: AC
  Administered 2016-02-17: 1000 mL via INTRAVENOUS

## 2016-02-17 MED ORDER — BISACODYL 10 MG RE SUPP: 10.0000 mg | Freq: Every day | RECTAL | Status: DC | PRN

## 2016-02-17 MED ORDER — PIPERACILLIN-TAZOBACTAM 3.375 G IVPB
3.3750 g | Freq: Three times a day (TID) | INTRAVENOUS | Status: DC
  Administered 2016-02-17 – 2016-02-20 (×8): 3.375 g via INTRAVENOUS
  Filled 2016-02-17 (×12): qty 50

## 2016-02-17 MED ORDER — FLUMAZENIL 0.5 MG/5ML IV SOLN
0.2000 mg | Freq: Once | INTRAVENOUS | Status: AC
  Administered 2016-02-17: 0.2 mg via INTRAVENOUS
  Filled 2016-02-17: qty 5

## 2016-02-17 MED ORDER — HYDROCODONE-ACETAMINOPHEN 5-325 MG PO TABS
1.0000 | ORAL_TABLET | Freq: Four times a day (QID) | ORAL | Status: DC | PRN
  Administered 2016-02-18 – 2016-02-19 (×2): 1 via ORAL
  Filled 2016-02-17 (×2): qty 1

## 2016-02-17 MED ORDER — IPRATROPIUM-ALBUTEROL 0.5-2.5 (3) MG/3ML IN SOLN
3.0000 mL | Freq: Once | RESPIRATORY_TRACT | Status: AC
  Administered 2016-02-17: 3 mL via RESPIRATORY_TRACT
  Filled 2016-02-17: qty 3

## 2016-02-17 MED ORDER — SODIUM CHLORIDE 0.9 % IV SOLN
INTRAVENOUS | Status: DC
  Administered 2016-02-17 – 2016-02-18 (×2): via INTRAVENOUS

## 2016-02-17 MED ORDER — DARIFENACIN HYDROBROMIDE ER 7.5 MG PO TB24
7.5000 mg | ORAL_TABLET | Freq: Every day | ORAL | Status: DC
  Administered 2016-02-18 – 2016-02-20 (×3): 7.5 mg via ORAL
  Filled 2016-02-17 (×3): qty 1

## 2016-02-17 MED ORDER — LORAZEPAM 2 MG/ML IJ SOLN
0.5000 mg | Freq: Once | INTRAMUSCULAR | Status: AC
  Administered 2016-02-17: 0.5 mg via INTRAVENOUS
  Filled 2016-02-17: qty 1

## 2016-02-17 MED ORDER — SODIUM BICARBONATE 8.4 % IV SOLN
INTRAVENOUS | Status: AC
  Filled 2016-02-17: qty 50

## 2016-02-17 MED ORDER — GABAPENTIN 300 MG PO CAPS
300.0000 mg | ORAL_CAPSULE | Freq: Two times a day (BID) | ORAL | Status: DC
  Administered 2016-02-18 – 2016-02-20 (×5): 300 mg via ORAL
  Filled 2016-02-17 (×7): qty 1

## 2016-02-17 MED ORDER — SODIUM PHOSPHATE 3 MMOLE/ML IV SOLN
30.0000 mmol | Freq: Once | INTRAVENOUS | Status: AC
  Administered 2016-02-17: 30 mmol via INTRAVENOUS
  Filled 2016-02-17: qty 10

## 2016-02-17 MED ORDER — SENNOSIDES-DOCUSATE SODIUM 8.6-50 MG PO TABS
1.0000 | ORAL_TABLET | Freq: Every evening | ORAL | Status: DC | PRN
  Filled 2016-02-17: qty 1

## 2016-02-17 MED ORDER — POTASSIUM PHOSPHATES 15 MMOLE/5ML IV SOLN
30.0000 mmol | Freq: Once | INTRAVENOUS | Status: DC
  Filled 2016-02-17: qty 10

## 2016-02-17 MED ORDER — POTASSIUM CHLORIDE 10 MEQ/100ML IV SOLN
10.0000 meq | INTRAVENOUS | Status: AC
Start: 2016-02-17 — End: 2016-02-17
  Administered 2016-02-17 (×2): 10 meq via INTRAVENOUS
  Filled 2016-02-17 (×2): qty 100

## 2016-02-17 MED ORDER — INSULIN ASPART 100 UNIT/ML ~~LOC~~ SOLN
0.0000 [IU] | Freq: Three times a day (TID) | SUBCUTANEOUS | Status: DC
  Administered 2016-02-18: 5 [IU] via SUBCUTANEOUS

## 2016-02-17 MED ORDER — SODIUM CHLORIDE 0.9 % IV SOLN
INTRAVENOUS | Status: DC
  Administered 2016-02-17: 09:00:00 via INTRAVENOUS

## 2016-02-17 MED ORDER — MAGNESIUM CITRATE PO SOLN: 1.0000 | Freq: Once | ORAL | Status: DC | PRN

## 2016-02-17 MED ORDER — SODIUM CHLORIDE 0.9 % IV SOLN
INTRAVENOUS | Status: DC
  Administered 2016-02-17: 5.4 [IU]/h via INTRAVENOUS
  Filled 2016-02-17: qty 2.5

## 2016-02-17 MED ORDER — ACETAMINOPHEN 325 MG PO TABS: 650.0000 mg | ORAL_TABLET | Freq: Four times a day (QID) | ORAL | Status: DC | PRN

## 2016-02-17 MED ORDER — SODIUM CHLORIDE 0.9 % IV SOLN
INTRAVENOUS | Status: AC
  Administered 2016-02-17: 11:00:00 via INTRAVENOUS

## 2016-02-17 MED ORDER — MESALAMINE 1.2 G PO TBEC
2400.0000 mg | DELAYED_RELEASE_TABLET | Freq: Two times a day (BID) | ORAL | Status: DC
  Administered 2016-02-18 – 2016-02-20 (×3): 2.4 g via ORAL
  Filled 2016-02-17 (×5): qty 2

## 2016-02-17 MED ORDER — ONDANSETRON HCL 4 MG PO TABS: 4.0000 mg | ORAL_TABLET | Freq: Four times a day (QID) | ORAL | Status: DC | PRN

## 2016-02-17 MED ORDER — VANCOMYCIN HCL IN DEXTROSE 750-5 MG/150ML-% IV SOLN
750.0000 mg | INTRAVENOUS | Status: DC
  Filled 2016-02-17: qty 150

## 2016-02-17 MED ORDER — ONDANSETRON HCL 4 MG/2ML IJ SOLN: 4.0000 mg | Freq: Four times a day (QID) | INTRAMUSCULAR | Status: DC | PRN

## 2016-02-17 MED ORDER — HEPARIN SODIUM (PORCINE) 5000 UNIT/ML IJ SOLN
5000.0000 [IU] | Freq: Three times a day (TID) | INTRAMUSCULAR | Status: DC
  Administered 2016-02-17 (×2): 5000 [IU] via SUBCUTANEOUS
  Filled 2016-02-17 (×4): qty 1

## 2016-02-17 MED ORDER — VANCOMYCIN HCL 10 G IV SOLR
1500.0000 mg | Freq: Once | INTRAVENOUS | Status: AC
  Administered 2016-02-17: 1500 mg via INTRAVENOUS
  Filled 2016-02-17: qty 1500

## 2016-02-17 MED ORDER — TRAZODONE HCL 50 MG PO TABS
25.0000 mg | ORAL_TABLET | Freq: Every evening | ORAL | Status: DC | PRN
  Filled 2016-02-17: qty 1

## 2016-02-17 NOTE — Progress Notes (Signed)
This is a no charge note  Pending and admission per Dr. Roxanne Mins  75 year old lady with past medical history of lung cancer, hypertension, diabetes mellitus, GERD, rheumatoid arthritis, psoriasis, who presents with shortness of breath.  Patient was recently hospitalized from 4/6-4/14 due to colitis and left distal fibular fracture. She underwent surgery by Dr. Francia Greaves 02/10/16, and discharged to rehabilitation facility. Today pt presents with SOB, but no CP per dr. Roxanne Mins. CXR showed volume loss over RML and RUL, but no infiltration. Patient was found to have DKA with AG>30 (bicarbonate less than 7) and CBG 775. 2L NS were ordered and then 125 cc/h. Insulin gtt was ordered by ED, will start soon. Pt is accepted to SDU as inpt status.   Ivor Costa, MD  [facility] Pager [phone]  If 7PM-7AM, please contact night-coverage [email or web address] Password [facility] 02/17/2016, 6:47 AM

## 2016-02-17 NOTE — H&P (Signed)
STAFF NOTE: I, Dr Ann Lions have personally reviewed patient's available data, including medical history, events of note, physical examination and test results as part of my evaluation. I have discussed with resident/NP and other care providers such as pharmacist, RN and RRT.  In addition,  I personally evaluated patient and elicited key findings of   S: Megan [name] known to me from opd for mild emphhysema in setting of advanced lung cancer. Course last few years complicated by severe depression and neuropathy and physical deconditioning following platinum based chemo. In 2016 on nivolumab and complicated by hyperglycemia (severe uncontrolled).  In Jan 2017 CT with relative good control of cancer and significant inprovement in performance status.  and s/p interval break in MAb infusion.  In April 2017 she had ankle fracture but now admitted 02/17/2016 with severe hyperglycemia and severe DKA  O: Looks very deconditioned and much worse than I have seen her in office Bair hugger on Mildly confused - took a while to recognize you Tachypneic Not paradoxical Soft abdomen  PULMONARY  Recent Labs Lab 02/17/16 0650 02/17/16 0938  PHART  --  7.103*  PCO2ART  --  17.4*  PO2ART  --  127.0*  HCO3 4.0* 5.4*  TCO2 <5 6  O2SAT 77.0 98.0    CBC  Recent Labs Lab 02/11/16 0458 02/12/16 0416 02/17/16 0530  HGB 10.6* 10.5* 13.2  HCT 33.3* 32.3* 41.5  WBC 7.5  --  13.9*  PLT 198  --  589*    COAGULATION No results for input(s): INR in the last 168 hours.  CARDIAC   Recent Labs Lab 02/17/16 0530 02/17/16 0901  TROPONINI 0.03 0.03   No results for input(s): PROBNP in the last 168 hours.   CHEMISTRY  Recent Labs Lab 02/11/16 0458 02/17/16 0530 02/17/16 0901  NA 141 127* 130*  K 3.8 4.9 4.6  CL 103 90* 101  CO2 28 <7* <7*  GLUCOSE 149* 775* 689*  BUN 8 22* 25*  CREATININE 0.72 1.85* 1.90*  CALCIUM 8.1* 9.1 7.6*  MG  --   --  2.1  PHOS  --   --  6.9*   Estimated  Creatinine Clearance: 24.3 mL/min (by C-G formula based on Cr of 1.9).   LIVER  Recent Labs Lab 02/17/16 0644  AST 25  ALT 13*  ALKPHOS 126  BILITOT 2.0*  PROT 5.6*  ALBUMIN 2.5*     INFECTIOUS  Recent Labs Lab 02/17/16 1001  LATICACIDVEN 3.65*     ENDOCRINE CBG (last 3)   Recent Labs  02/17/16 0751 02/17/16 0853 02/17/16 1004  GLUCAP >600* >600* 501*         IMAGING x48h  - image(s) personally visualized  -   highlighted in bold Dg Chest Port 1 View  02/17/2016  CLINICAL DATA:  Dyspnea. History of hypertension. Previous right lung cancer. EXAM: PORTABLE CHEST 1 VIEW COMPARISON:  Chest 02/04/2016.  CT chest 11/30/2015 FINDINGS: Unchanged appearance of the chest with chronic volume loss in the right upper and middle lungs associated with mediastinal shift towards the right and hyperinflation of the left lung. No acute consolidation or airspace disease. No blunting of costophrenic angles. No pneumothorax. Heart size and pulmonary vascularity are normal. IMPRESSION: Chronic volume loss in the right middle and upper lungs. No evidence of active pulmonary disease. Electronically Signed   By: Lucienne Capers M.D.   On: 02/17/2016 05:52       A: severfe DKA with compensation High risk for resp failure  due to lung cancer and underlying emo=physema AKI due to DKA Lactic acidosis  P: DKA protocol A-line mgmt Aggressive resus Fluids Broard abx ABG serial monitoring Mag phos monitoring Intubate if worse - full code  Sjhje has no family -  A friend Mr Megan [name] and another lady friend would accompany her to office visits.  In past has declined hospice but that was 2015 when ECOG was 4.Most recently in Jan 2017 ECOG was 2 and she was driving  .  Rest per NP/medical resident whose note is outlined above and that I agree with  The patient is critically ill with multiple organ systems failure and requires high complexity decision making for assessment and support,  frequent evaluation and titration of therapies, application of advanced monitoring technologies and extensive interpretation of multiple databases.   Critical Care Time devoted to patient care services described in this note is  30  Minutes. This time reflects time of care of this signee Dr Brand Males. This critical care time does not reflect procedure time, or teaching time or supervisory time of PA/NP/Med student/Med Resident etc but could involve care discussion time    Dr. Brand Males, M.D., [facility].C.P Pulmonary and Critical Care Medicine Staff Physician [facility] Pager: [phone], If no answer or between  15:00h - 7:00h: call [phone]  02/17/2016 10:48 AM

## 2016-02-17 NOTE — Progress Notes (Signed)
Inpatient Diabetes Program Recommendations  AACE/ADA: New Consensus Statement on Inpatient Glycemic Control (2015)  Target Ranges:  Prepandial:   less than 140 mg/dL      Peak postprandial:   less than 180 mg/dL (1-2 hours)      Critically ill patients:  140 - 180 mg/dL   Review of Glycemic Control  Diabetes history: DM 2 Outpatient Diabetes medications: Lantus 34 units, Novolog Moderate correction Current orders for Inpatient glycemic control: insulin gtt  Note patient in DKA transferring to the unit. On insulin gtt at this time. Patient is from SNF for rehab after recent admission. Unsure if patient was actually receiving insulin at the SNF looking at severity of DKA. Will monitor patient while inpatient. Refer to DKA sidebar for fluid and transition help. Last admission patient was on Lantus 34 units Q24hrs and Novolog Moderate + Novolog 5 units meal coverage if patient will be eating later. Patient to be on insulin gtt until acidosis clears AND glucose is at goal.  Thanks,  Tama Headings RN, MSN, [facility] Coordinator Team Pager [phone] (8a-5p)

## 2016-02-17 NOTE — ED Notes (Signed)
Critical Care NP at bedside placing a radial arterial line.

## 2016-02-17 NOTE — Progress Notes (Signed)
Pharmacy Antibiotic Note  Megan [name] is a 75 y.o. female admitted on 02/17/2016 with sepsis.  Pharmacy has been consulted for vancomycin and zosyn dosing. Pt is hypothermic and WBC is elevated at 13.9. Scr is elevated at 1.9. Lactic acid was elevated but is now trending down.   Plan: - Vancomycin '1500mg'$  IV x 1 then '750mg'$  IV Q24H - Zosyn 3.375gm IV Q8H (4hf inf) - F/u renal fxn, C&S, clinical status and trough at SS     Temp (24hrs), Avg:94.2 F (34.6 C), Min:93.9 F (34.4 C), Max:94.5 F (34.7 C)   Recent Labs Lab 02/11/16 0458 02/17/16 0530 02/17/16 0901 02/17/16 1001 02/17/16 1105  WBC 7.5 13.9*  --   --   --   CREATININE 0.72 1.85* 1.90*  --   --   LATICACIDVEN  --   --   --  3.65* 1.56    Estimated Creatinine Clearance: 24.3 mL/min (by C-G formula based on Cr of 1.9).    Allergies  Allergen Reactions  . Carboplatin Other (See Comments)    Neuropathy  . Remicade [Infliximab] Anaphylaxis  . Hydromorphone Rash    Antimicrobials this admission: Vanc 4/19>> Zosyn 4/19>>  Dose adjustments this admission: N/A  Microbiology results: Pending  Thank you for allowing pharmacy to be a part of this patient's care.  Makayah Pauli, Rande Lawman 02/17/2016 11:22 AM

## 2016-02-17 NOTE — Consult Note (Signed)
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 Recent hospitalized from 4/10 - 4/14 after a fall with left distal fibula fracture status post surgical repair by Dr. Sharol Given and colitis. Presents 4/19 to ED from SNF after being found to have increased SOB. In ED, in acute respiratory distress and altered mental status. CXR revealed chronic interstitial disease with no acute findings however patient was found to be in DKA. PCCM to admit.   PAST MEDICAL HISTORY :  She  has a past medical history of Hypertension; Rheumatoid arthritis ([place]); GERD (gastroesophageal reflux disease); radiation therapy (12/16/13-01/30/14); Neuropathy ([place]); Shortness of breath dyspnea; History of hiatal hernia; Diabetes mellitus; Cancer ([place]) (dx'd 09/2013); Malignant neoplasm of right upper lobe of lung ([place]) (11/28/2013); and Psoriasis.  PAST SURGICAL HISTORY: She  has past surgical history that includes Cholecystectomy; Abdominal hysterectomy; Dilation and curettage of uterus; Abdominal surgery; Tonsillectomy; Esophagogastroduodenoscopy (egd) with propofol (N/A, 10/09/2014); Savory dilation (N/A, 10/09/2014); Esophagogastroduodenoscopy (N/A, 12/12/2014); Balloon dilation (N/A, 12/12/2014); ORIF ankle fracture (Right, 07/04/2015); Fracture surgery; Colonoscopy (N/A, 02/07/2016); and ORIF ankle fracture (Left, 02/10/2016).  Allergies  Allergen Reactions  . Carboplatin Other (See Comments)    Neuropathy  . Remicade [Infliximab] Anaphylaxis  . Hydromorphone Rash    Current Facility-Administered  Medications on File Prior to Encounter  Medication  . sodium chloride 0.9 % injection 10 mL   Current Outpatient Prescriptions on File Prior to Encounter  Medication Sig  . fluticasone (CUTIVATE) 0.05 % cream Apply 1 application topically 2 (two) times daily as needed (rash).   . Fluticasone-Salmeterol (ADVAIR DISKUS) 100-50 MCG/DOSE AEPB Inhale 1 puff into the lungs 2 (two) times daily. (Patient taking differently: Inhale 1 puff into the lungs 2 (two) times daily as needed (sob and wheezing). )  . gabapentin (NEURONTIN) 300 MG capsule Take 1 capsule (300 mg total) by mouth 2 (two) times daily.  [name] HYDROcodone-acetaminophen (NORCO/VICODIN) 5-325 MG tablet Take 1-2 tablets by mouth every 6 (six) hours as needed for moderate pain.  [name] insulin aspart (NOVOLOG) 100 UNIT/ML injection Inject 0-15 Units into the skin 3 (three) times daily with meals. Sliding scale  CBG 70 - 120: 0 units: CBG 121 - 150: 2 units; CBG 151 - 200: 3 units; CBG 201 - 250: 5 units; CBG 251 - 300: 8 units;CBG 301 - 350: 11 units; CBG 351 - 400: 15 units; CBG > 400 : 15 units and notify MD  . insulin detemir (LEVEMIR) 100 UNIT/ML injection Inject 34 Units into the skin at bedtime.   [name] ketoconazole (NIZORAL) 2 % cream Apply 1 application topically 2 (two) times daily as needed for irritation.   . Mesalamine (ASACOL HD) 800 MG TBEC Take 3 tablets (2,400 mg total) by mouth 2 (two) times daily.  [name] saccharomyces boulardii (FLORASTOR) 250 MG capsule Take 1 capsule (250 mg total) by mouth 2 (two) times daily.  . VESICARE 5 MG tablet Take 5 mg by mouth daily.  . Vitamin D, Ergocalciferol, (DRISDOL) 50000 units CAPS capsule Take 1 capsule (50,000 Units total) by mouth every 7 (seven) days.  FAMILY HISTORY:  Her indicated that her mother is deceased. She indicated that her father is deceased.   SOCIAL HISTORY: She  reports that she quit smoking about 18 years ago. Her smoking use included Cigarettes. She has a 135 pack-year smoking  history. She has never used smokeless tobacco. She reports that she does not drink alcohol or use illicit drugs.  REVIEW OF SYSTEMS:   Unable to obtain.   SUBJECTIVE:  Adult female in acute respiratory distress. Laying in bed lethargic with kussmaul respirations.   VITAL SIGNS: BP 102/44 mmHg  Pulse 111  Temp(Src) 96.6 F (35.9 C)  Resp 21  SpO2 99%  HEMODYNAMICS:    VENTILATOR SETTINGS:    INTAKE / OUTPUT:    PHYSICAL EXAMINATION: General: Adult female, laying in bed, acute respiratory distress  Neuro: lethargic, follows commands  HEENT: PERRL, atraumatic, normocephalic  Cardiovascular:  No MRG, tachycardia regular rhythm, modeling to bilaterally lower extremities  Lungs: Kussmaul respirations, bilateral wheezing, no crackles or rales.   Abdomen: Active bowel sounds, no tenderness, no distention  Musculoskeletal: Left leg boot, sutures to left lower leg - dry, intact Skin: Cool, intact, modeling to lower extremities   LABS:  BMET  Recent Labs Lab 02/11/16 0458 02/17/16 0530 02/17/16 0901  NA 141 127* 130*  K 3.8 4.9 4.6  CL 103 90* 101  CO2 28 <7* <7*  BUN 8 22* 25*  CREATININE 0.72 1.85* 1.90*  GLUCOSE 149* 775* 689*    Electrolytes  Recent Labs Lab 02/11/16 0458 02/17/16 0530 02/17/16 0901  CALCIUM 8.1* 9.1 7.6*  MG  --   --  2.1  PHOS  --   --  6.9*    CBC  Recent Labs Lab 02/11/16 0458 02/12/16 0416 02/17/16 0530  WBC 7.5  --  13.9*  HGB 10.6* 10.5* 13.2  HCT 33.3* 32.3* 41.5  PLT 198  --  589*    Coag's No results for input(s): APTT, INR in the last 168 hours.  Sepsis Markers  Recent Labs Lab 02/17/16 1001 02/17/16 1105  LATICACIDVEN 3.65* 1.56    ABG  Recent Labs Lab 02/17/16 0938  PHART 7.103*  PCO2ART 17.4*  PO2ART 127.0*    Liver Enzymes  Recent Labs Lab 02/17/16 0644  AST 25  ALT 13*  ALKPHOS 126  BILITOT 2.0*  ALBUMIN 2.5*    Cardiac Enzymes  Recent Labs Lab 02/17/16 0530 02/17/16 0901   TROPONINI 0.03 0.03    Glucose  Recent Labs Lab 02/12/16 1200 02/17/16 0751 02/17/16 0853 02/17/16 1004 02/17/16 1106 02/17/16 1210  GLUCAP 268* >600* >600* 501* 461* 336*    Imaging Dg Chest Port 1 View  02/17/2016  CLINICAL DATA:  Dyspnea. History of hypertension. Previous right lung cancer. EXAM: PORTABLE CHEST 1 VIEW COMPARISON:  Chest 02/04/2016.  CT chest 11/30/2015 FINDINGS: Unchanged appearance of the chest with chronic volume loss in the right upper and middle lungs associated with mediastinal shift towards the right and hyperinflation of the left lung. No acute consolidation or airspace disease. No blunting of costophrenic angles. No pneumothorax. Heart size and pulmonary vascularity are normal. IMPRESSION: Chronic volume loss in the right middle and upper lungs. No evidence of active pulmonary disease. Electronically Signed   By: Lucienne Capers M.D.   On: 02/17/2016 05:52     STUDIES:  4/19 CXR : No acute findings   CULTURES: Urine 4/19 >> Blood x 2 4/19 >>  ANTIBIOTICS: Vancomycin 4/19 >> Zoysn 4/19 >>   SIGNIFICANT EVENTS: 4/14 :  D/C from hospital post surgical repair of left distal fibula fracture  4/19: presents ED with SOB from SNF   LINES/TUBES: Foley 4/19 >>  Right radial aline 4/19>>>  DISCUSSION: 75 year old female presented to ED on 4/19 with SOB found to be in Compensating DKA complicated by underlying lung CA and emphysema. Will admit to ICU to monitor closely for respiratory decompensation and DKA protocol.   ASSESSMENT / PLAN:  PULMONARY A:  Tachypnea in setting of metabolic acidosis and DKA H/O Stage III Squamous Cell Lung CA, Emphysema  P:   ABG q 4 hours to watch for decompensation  Supplemental oxygen as needed  Intubation if worsening   CARDIOVASCULAR A:  SIRS vs sepsis  Circulatory shock, hypovolemia vs sepsis  P:  Cardiac Monitoring  Fluid replacement  Trend Troponin   RENAL A:   Acute Kidney Injury  Lactic  Acidosis - Clearing  Hyponatremia  P:   Serial BMP  Trend Lactic Acid  Aggressive Fluid resuscitation  Electrolyte replacement as needed    GASTROINTESTINAL A:   H/O colitis   P:   NPO  PPI   HEMATOLOGIC A:  H/O Stage III Squamous Cell CA with possible Mets to Liver  P:  Daily CBC   heparin for DVT prophylaxis   INFECTIOUS A:  R/o Septic Shock unknown source  Leukocytosis  Hypothermia   P:   PAN cultures  Bair hugger  Trend WBC and Fever curve  Vancomycin and Zoysn (see above)  ENDOCRINE A:   DKA with compensation  H/O DM 2     P:   DKA protocol  Beta-hydroxybutyric acid  Fluid resuscitation insulin gtt POC BG checks potassium/phos monitoring electrolyte repletion protocol  NEUROLOGIC A:   Acute Metabolic Encephalopathy   P:   DKA treatment  RASS goal: 0 Hold all sedating meds     FAMILY  - Updates: No family at bedside will attempt to call  - Inter-disciplinary family meet or Palliative Care meeting due by: 02/24/2016   Erick Colace ACNP-BC [place] Pager # [phone] OR # [phone] if no answer   02/17/2016, 12:49 PM

## 2016-02-17 NOTE — ED Notes (Addendum)
Pt. arrived with EMS from [facility] at [facility] ( for rehab s/p left ankle fracture surgery)  reports sudden onset SOB this morning , received Haldol 5 mg IV by EMS for agitation / restlessness during transport , breath sounds clear / no cough or congestion .

## 2016-02-17 NOTE — ED Provider Notes (Signed)
CSN: [id number]     Arrival date & time 02/17/16  0506 History   First MD Initiated Contact with Patient 02/17/16 [id number]     Chief Complaint  Patient presents with  . Shortness of Breath     (Consider location/radiation/quality/duration/timing/severity/associated sxs/prior Treatment) Patient is a 75 y.o. female presenting with shortness of breath. The history is provided by the patient.  Shortness of Breath She Has a history of metastatic lung cancer as well as pulmonary emphysema. She is one week post an open reduction internal fixation of ankle fracture. She is at a skilled nursing facility where she apparently suddenly became dyspneic this evening. EMS noted that she was agitated and had to give her haloperidol. She denies chest pain, cough, fever, chills. Nothing makes symptoms better nothing makes them worse.  Past Medical History  Diagnosis Date  . Hypertension   . Rheumatoid arthritis ([place])   . GERD (gastroesophageal reflux disease)     no meds for  . Hx of radiation therapy 12/16/13-01/30/14    lung 66Gy  . Neuropathy ([place])   . Shortness of breath dyspnea     with exertion  . History of hiatal hernia   . Diabetes mellitus     insulin  . Cancer ([place]) dx'd 09/2013    Lung ca  . Malignant neoplasm of right upper lobe of lung ([place]) 11/28/2013  . Psoriasis    Past Surgical History  Procedure Laterality Date  . Cholecystectomy    . Abdominal hysterectomy    . Dilation and curettage of uterus    . Abdominal surgery    . Tonsillectomy    . Esophagogastroduodenoscopy (egd) with propofol N/A 10/09/2014    Procedure: ESOPHAGOGASTRODUODENOSCOPY (EGD) WITH PROPOFOL;  Surgeon: Inda Castle, MD;  Location: [facility];  Service: Endoscopy;  Laterality: N/A;  . Savory dilation N/A 10/09/2014    Procedure: SAVORY DILATION;  Surgeon: Inda Castle, MD;  Location: [facility];  Service: Endoscopy;  Laterality: N/A;  . Esophagogastroduodenoscopy N/A 12/12/2014    Procedure:  ESOPHAGOGASTRODUODENOSCOPY (EGD);  Surgeon: Inda Castle, MD;  Location: [facility];  Service: Endoscopy;  Laterality: N/A;  with dilation  . Balloon dilation N/A 12/12/2014    Procedure: BALLOON DILATION;  Surgeon: Inda Castle, MD;  Location: [facility];  Service: Endoscopy;  Laterality: N/A;  . Orif ankle fracture Right 07/04/2015    Procedure: OPEN REDUCTION INTERNAL FIXATION (ORIF) ANKLE FRACTURE;  Surgeon: Newt Minion, MD;  Location: [facility];  Service: Orthopedics;  Laterality: Right;  OPEN REDUCTION, INTERNAL FIXATION OF RIGHT ANKLE FRACTURE.   . Fracture surgery    . Colonoscopy N/A 02/07/2016    Procedure: COLONOSCOPY;  Surgeon: Doran Stabler, MD;  Location: [facility] ENDOSCOPY;  Service: Endoscopy;  Laterality: N/A;  . Orif ankle fracture Left 02/10/2016    Procedure: OPEN REDUCTION INTERNAL FIXATION (ORIF) ANKLE FRACTURE;  Surgeon: Newt Minion, MD;  Location: [facility];  Service: Orthopedics;  Laterality: Left;   Family History  Problem Relation Age of Onset  . Hypertension Other   . Stroke Other   . Heart attack Other   . Hemachromatosis Other   . Rheum arthritis      both sets of grandparents and father  . Other Mother   . Other Father     failure to thrive   Social History  Substance Use Topics  . Smoking status: Former Smoker -- 3.00 packs/day for 45 years    Types: Cigarettes    Quit  date: 12/05/1997  . Smokeless tobacco: Never Used  . Alcohol Use: No   OB History    No data available     Review of Systems  Respiratory: Positive for shortness of breath.   All other systems reviewed and are negative.     Allergies  Carboplatin; Remicade; and Hydromorphone  Home Medications   Prior to Admission medications   Medication Sig Start Date End Date Taking? Authorizing Provider  fluticasone (CUTIVATE) 0.05 % cream Apply 1 application topically 2 (two) times daily as needed (rash).     Historical Provider, MD  Fluticasone-Salmeterol (ADVAIR DISKUS) 100-50  MCG/DOSE AEPB Inhale 1 puff into the lungs 2 (two) times daily. Patient taking differently: Inhale 1 puff into the lungs 2 (two) times daily as needed (sob and wheezing).  08/27/14   Brand Males, MD  gabapentin (NEURONTIN) 300 MG capsule Take 1 capsule (300 mg total) by mouth 2 (two) times daily. 02/12/16   Ripudeep Krystal Eaton, MD  HYDROcodone-acetaminophen (NORCO/VICODIN) 5-325 MG tablet Take 1-2 tablets by mouth every 6 (six) hours as needed for moderate pain. 02/12/16   Ripudeep Krystal Eaton, MD  insulin aspart (NOVOLOG) 100 UNIT/ML injection Inject 0-15 Units into the skin 3 (three) times daily with meals. Sliding scale  CBG 70 - 120: 0 units: CBG 121 - 150: 2 units; CBG 151 - 200: 3 units; CBG 201 - 250: 5 units; CBG 251 - 300: 8 units;CBG 301 - 350: 11 units; CBG 351 - 400: 15 units; CBG > 400 : 15 units and notify MD 02/12/16   Ripudeep Krystal Eaton, MD  insulin detemir (LEVEMIR) 100 UNIT/ML injection Inject 34 Units into the skin at bedtime.     Historical Provider, MD  ketoconazole (NIZORAL) 2 % cream Apply 1 application topically 2 (two) times daily as needed for irritation.  01/04/16   Historical Provider, MD  Mesalamine (ASACOL HD) 800 MG TBEC Take 3 tablets (2,400 mg total) by mouth 2 (two) times daily. 02/10/16   Nelida Meuse III, MD  saccharomyces boulardii (FLORASTOR) 250 MG capsule Take 1 capsule (250 mg total) by mouth 2 (two) times daily. 02/12/16   Ripudeep Krystal Eaton, MD  VESICARE 5 MG tablet Take 5 mg by mouth daily. 01/09/16   Historical Provider, MD  Vitamin D, Ergocalciferol, (DRISDOL) 50000 units CAPS capsule Take 1 capsule (50,000 Units total) by mouth every 7 (seven) days. 02/12/16   Ripudeep Krystal Eaton, MD   There were no vitals taken for this visit. Physical Exam  Nursing note and vitals reviewed.  75 year old female, obviously hyperventilating, but in no actual respiratory distress. Vital signs are significant for tachycardia and tachypnea. Oxygen saturation is 100%, which is normal. Head is  normocephalic and atraumatic. PERRLA, EOMI. Oropharynx is clear. Neck is nontender and supple without adenopathy or JVD. Back is nontender and there is no CVA tenderness. Lungs have a slightly prolonged exhalation phase, and there are some faint rales at the right base. Chest is nontender. Heart has regular rate and rhythm without murmur. Abdomen is soft, flat, nontender without masses or hepatosplenomegaly and peristalsis is normoactive. Extremities have no cyanosis or edema, full range of motion is present. Skin is warm and dry without rash. Neurologic: She is awake and will answer questions but is very limited as to how much she will speak, cranial nerves are intact, there are no motor or sensory deficits.  ED Course  Procedures (including critical care time) Labs Review Results for orders placed or  performed during the hospital encounter of 02/17/16  Troponin I  Result Value Ref Range   Troponin I 0.03 <0.031 ng/mL  Basic metabolic panel  Result Value Ref Range   Sodium 127 (L) 135 - 145 mmol/L   Potassium 4.9 3.5 - 5.1 mmol/L   Chloride 90 (L) 101 - 111 mmol/L   CO2 <7 (L) 22 - 32 mmol/L   Glucose, Bld 775 (HH) 65 - 99 mg/dL   BUN 22 (H) 6 - 20 mg/dL   Creatinine, Ser 1.85 (H) 0.44 - 1.00 mg/dL   Calcium 9.1 8.9 - 10.3 mg/dL   GFR calc non Af Amer 26 (L) >60 mL/min   GFR calc Af Amer 30 (L) >60 mL/min  CBC with Differential  Result Value Ref Range   WBC 13.9 (H) 4.0 - 10.5 K/uL   RBC 4.36 3.87 - 5.11 MIL/uL   Hemoglobin 13.2 12.0 - 15.0 g/dL   HCT 41.5 36.0 - 46.0 %   MCV 95.2 78.0 - 100.0 fL   MCH 30.3 26.0 - 34.0 pg   MCHC 31.8 30.0 - 36.0 g/dL   RDW 15.0 11.5 - 15.5 %   Platelets 589 (H) 150 - 400 K/uL   Neutrophils Relative % 37 %   Lymphocytes Relative 16 %   Monocytes Relative 7 %   Eosinophils Relative 0 %   Basophils Relative 0 %   Band Neutrophils 6 %   Metamyelocytes Relative 15 %   Myelocytes 17 %   Promyelocytes Absolute 2 %   Blasts 0 %   nRBC 0 0  /100 WBC   Other 0 %   Neutro Abs 10.7 (H) 1.7 - 7.7 K/uL   Lymphs Abs 2.2 0.7 - 4.0 K/uL   Monocytes Absolute 1.0 0.1 - 1.0 K/uL   Eosinophils Absolute 0.0 0.0 - 0.7 K/uL   Basophils Absolute 0.0 0.0 - 0.1 K/uL   RBC Morphology BURR CELLS    WBC Morphology      MODERATE LEFT SHIFT (>5% METAS AND MYELOS,OCC PRO NOTED)  I-Stat venous blood gas, ED  Result Value Ref Range   pH, Ven 6.920 (LL) 7.250 - 7.300   pCO2, Ven 19.7 (L) 45.0 - 50.0 mmHg   pO2, Ven 67.0 (H) 31.0 - 45.0 mmHg   Bicarbonate 4.0 (L) 20.0 - 24.0 mEq/L   TCO2 <5 0 - 100 mmol/L   O2 Saturation 77.0 %   Acid-base deficit 28.0 (H) 0.0 - 2.0 mmol/L   Patient temperature HIDE    Sample type VENOUS    Comment NOTIFIED PHYSICIAN    Imaging Review Dg Chest Port 1 View  02/17/2016  CLINICAL DATA:  Dyspnea. History of hypertension. Previous right lung cancer. EXAM: PORTABLE CHEST 1 VIEW COMPARISON:  Chest 02/04/2016.  CT chest 11/30/2015 FINDINGS: Unchanged appearance of the chest with chronic volume loss in the right upper and middle lungs associated with mediastinal shift towards the right and hyperinflation of the left lung. No acute consolidation or airspace disease. No blunting of costophrenic angles. No pneumothorax. Heart size and pulmonary vascularity are normal. IMPRESSION: Chronic volume loss in the right middle and upper lungs. No evidence of active pulmonary disease. Electronically Signed   By: Lucienne Capers M.D.   On: 02/17/2016 05:52   I have personally reviewed and evaluated these images and lab results as part of my medical decision-making.   EKG Interpretation   Date/Time:  Wednesday February 17 2016 05:12:53 EDT Ventricular Rate:  120 PR Interval:  165 QRS  Duration: 99 QT Interval:  341 QTC Calculation: 482 R Axis:   46 Text Interpretation:  Sinus tachycardia Probable left atrial enlargement  When compared with ECG of 02/10/2016, HEART RATE has increased Confirmed by  [facility]  MD, Kalei Mckillop ([id number]) on  02/17/2016 5:32:02 AM      CRITICAL CARE Performed by: YYFRT,MYTRZ Total critical care time: 80 minutes Critical care time was exclusive of separately billable procedures and treating other patients. Critical care was necessary to treat or prevent imminent or life-threatening deterioration. Critical care was time spent personally by me on the following activities: development of treatment plan with patient and/or surrogate as well as nursing, discussions with consultants, evaluation of patient's response to treatment, examination of patient, obtaining history from patient or surrogate, ordering and performing treatments and interventions, ordering and review of laboratory studies, ordering and review of radiographic studies, pulse oximetry and re-evaluation of patient's condition.  MDM   Final diagnoses:  Diabetic ketoacidosis without coma associated with type 1 diabetes mellitus ([place])  Acute kidney injury (nontraumatic) ([facility])    Acute dyspnea. She is afebrile and therefore I doubt pneumonia. Chest x-ray will be obtained. She is at that time frame that she would be at risk for pulmonary embolism. Old records are reviewed confirming recent ankle surgery, history of lung cancer, history of emphysema. She'll be given a therapeutic trial of albuterol with ipratropium. She is also given a small dose of lorazepam.  Laboratory workup was come back showing a sinus in ketoacidosis with anion gap of 30. She seemed overly sedated following lorazepam and is given Romazicon with improvement in mental status. She is also noted to have acute kidney injury, probably secondary to dehydration. Sedating event is not obvious. She is given aggressive IV hydration and started on glucose stabilizer. Case is discussed with Dr. Blaine [facility] of [facility] agrees to admit the patient to stepdown unit.  Delora Fuel, MD 73/56/70 1410

## 2016-02-17 NOTE — H&P (Signed)
History and Physical    Megan [name]:[id number] DOB: Feb 15, 1941 DOA: 02/17/2016  Referring MD/NP/PA:  PCP: [name], MD  Outpatient Specialists: Patient Care Team: Anda Kraft, MD as PCP - General (Endocrinology) Curt Bears, MD as Consulting Physician (Oncology) Patient coming from:  Rehab   Chief Complaint: DKA and shortness of breath  HPI: Megan [name] is a 75 y.o. female with medical history significant of DM2, Lung Ca not on current therapy,recently hospitalized from [facility] 02/12/2016 due to colitis and status post left distal fibular fracture requiring surgery by Dr. Sharol Given on 02/10/16.  She was discharged to rehabilitation facility.she was brought by EMS due to increasing shortness of breath without chest pain. Patient is in acute respiratory distress, therefore no further history could not be obtained. ER physician performed just x-ray, which showed volume loss over the right middle lobe and right upper lobe, but no infiltration. She was afebrile. She did demonstrate coarse mild respirations. She was agitated as well.   ED Course:  Labs showed the patient to have DKA, Glucose was 775, Anion Gap >30, Lactic Acid 3.82, Bicarb 6.92 All other workup pending. Glu in urine >1000 with Ketone >80. S/p 2 L NS, Insulin drip initiated. She continues to receive aggressive hydration and supportive treatment including Haldol for agitation.. Other workup in progress.  She will be admitted to SDU.    Review of Systems: As per HPI otherwise 10 point review unable to be obtained due to patient's agitation. .   Past Medical History  Diagnosis Date  . Hypertension   . Rheumatoid arthritis ([place])   . GERD (gastroesophageal reflux disease)     no meds for  . Hx of radiation therapy 12/16/13-01/30/14    lung 66Gy  . Neuropathy ([place])   . Shortness of breath dyspnea     with exertion  . History of hiatal hernia   . Diabetes mellitus     insulin  . Cancer ([place]) dx'd 09/2013      Lung ca  . Malignant neoplasm of right upper lobe of lung ([place]) 11/28/2013  . Psoriasis     Past Surgical History  Procedure Laterality Date  . Cholecystectomy    . Abdominal hysterectomy    . Dilation and curettage of uterus    . Abdominal surgery    . Tonsillectomy    . Esophagogastroduodenoscopy (egd) with propofol N/A 10/09/2014    Procedure: ESOPHAGOGASTRODUODENOSCOPY (EGD) WITH PROPOFOL;  Surgeon: Inda Castle, MD;  Location: [facility];  Service: Endoscopy;  Laterality: N/A;  . Savory dilation N/A 10/09/2014    Procedure: SAVORY DILATION;  Surgeon: Inda Castle, MD;  Location: [facility];  Service: Endoscopy;  Laterality: N/A;  . Esophagogastroduodenoscopy N/A 12/12/2014    Procedure: ESOPHAGOGASTRODUODENOSCOPY (EGD);  Surgeon: Inda Castle, MD;  Location: [facility];  Service: Endoscopy;  Laterality: N/A;  with dilation  . Balloon dilation N/A 12/12/2014    Procedure: BALLOON DILATION;  Surgeon: Inda Castle, MD;  Location: [facility];  Service: Endoscopy;  Laterality: N/A;  . Orif ankle fracture Right 07/04/2015    Procedure: OPEN REDUCTION INTERNAL FIXATION (ORIF) ANKLE FRACTURE;  Surgeon: Newt Minion, MD;  Location: [facility];  Service: Orthopedics;  Laterality: Right;  OPEN REDUCTION, INTERNAL FIXATION OF RIGHT ANKLE FRACTURE.   . Fracture surgery    . Colonoscopy N/A 02/07/2016    Procedure: COLONOSCOPY;  Surgeon: Doran Stabler, MD;  Location: [facility] ENDOSCOPY;  Service: Endoscopy;  Laterality: N/A;  . Orif  ankle fracture Left 02/10/2016    Procedure: OPEN REDUCTION INTERNAL FIXATION (ORIF) ANKLE FRACTURE;  Surgeon: Newt Minion, MD;  Location: [facility];  Service: Orthopedics;  Laterality: Left;     reports that she quit smoking about 18 years ago. Her smoking use included Cigarettes. She has a 135 pack-year smoking history. She has never used smokeless tobacco. She reports that she does not drink alcohol or use illicit drugs.  Allergies  Allergen Reactions   . Carboplatin Other (See Comments)    Neuropathy  . Remicade [Infliximab] Anaphylaxis  . Hydromorphone Rash    Family History  Problem Relation Age of Onset  . Hypertension Other   . Stroke Other   . Heart attack Other   . Hemachromatosis Other   . Rheum arthritis      both sets of grandparents and father  . Other Mother   . Other Father     failure to thrive    Family history reviewed and not pertinent (If you reviewed it)  Prior to Admission medications   Medication Sig Start Date End Date Taking? Authorizing Provider  fluticasone (CUTIVATE) 0.05 % cream Apply 1 application topically 2 (two) times daily as needed (rash).     Historical Provider, MD  Fluticasone-Salmeterol (ADVAIR DISKUS) 100-50 MCG/DOSE AEPB Inhale 1 puff into the lungs 2 (two) times daily. Patient taking differently: Inhale 1 puff into the lungs 2 (two) times daily as needed (sob and wheezing).  08/27/14   Brand Males, MD  gabapentin (NEURONTIN) 300 MG capsule Take 1 capsule (300 mg total) by mouth 2 (two) times daily. 02/12/16   Ripudeep Krystal Eaton, MD  HYDROcodone-acetaminophen (NORCO/VICODIN) 5-325 MG tablet Take 1-2 tablets by mouth every 6 (six) hours as needed for moderate pain. 02/12/16   Ripudeep Krystal Eaton, MD  insulin aspart (NOVOLOG) 100 UNIT/ML injection Inject 0-15 Units into the skin 3 (three) times daily with meals. Sliding scale  CBG 70 - 120: 0 units: CBG 121 - 150: 2 units; CBG 151 - 200: 3 units; CBG 201 - 250: 5 units; CBG 251 - 300: 8 units;CBG 301 - 350: 11 units; CBG 351 - 400: 15 units; CBG > 400 : 15 units and notify MD 02/12/16   Ripudeep Krystal Eaton, MD  insulin detemir (LEVEMIR) 100 UNIT/ML injection Inject 34 Units into the skin at bedtime.     Historical Provider, MD  ketoconazole (NIZORAL) 2 % cream Apply 1 application topically 2 (two) times daily as needed for irritation.  01/04/16   Historical Provider, MD  Mesalamine (ASACOL HD) 800 MG TBEC Take 3 tablets (2,400 mg total) by mouth 2 (two) times  daily. 02/10/16   Nelida Meuse III, MD  saccharomyces boulardii (FLORASTOR) 250 MG capsule Take 1 capsule (250 mg total) by mouth 2 (two) times daily. 02/12/16   Ripudeep Krystal Eaton, MD  VESICARE 5 MG tablet Take 5 mg by mouth daily. 01/09/16   Historical Provider, MD  Vitamin D, Ergocalciferol, (DRISDOL) 50000 units CAPS capsule Take 1 capsule (50,000 Units total) by mouth every 7 (seven) days. 02/12/16   Ripudeep Krystal Eaton, MD    Physical Exam: Filed Vitals:   02/17/16 0715 02/17/16 0730 02/17/16 0745 02/17/16 0817  BP: 115/93 112/55 130/57 111/68  Pulse: 120 116 120 121  Resp: '18 26 18 24  '$ SpO2: 99% 100% 99% 100%      Constitutional: in Acute respiratory distress, uncomfortable  Filed Vitals:   02/17/16 0715 02/17/16 0730 02/17/16 0745 02/17/16 3536  BP: 115/93 112/55 130/57 111/68  Pulse: 120 116 120 121  Resp: '18 26 18 24  '$ SpO2: 99% 100% 99% 100%   Eyes: PERRL, lids and conjunctivae normal ENMT: Mucous membranes are moist. Posterior pharynx clear of any exudate or lesions.Normal dentition.  Neck: normal, supple, no masses, no thyromegaly Respiratory: remarkable for bilateral wheezing, no crackles or rales. Kussmaul respirations. No accessory muscle use.  Cardiovascular:  Tachy Regular rate and rhythm, no murmurs / rubs / gallops. No extremity edema. 2+ pedal pulses. No carotid bruits.  Abdomen: no tenderness, no masses palpated. No hepatosplenomegaly. Bowel sounds positive.  Musculoskeletal: no clubbing / cyanosis. No joint deformity upper and lower extremities. Left leg with boot.Good ROM, no contractures. Normal muscle tone.  Skin: no rashes, lesions, ulcers. No induration Neurologic: CN 2-12 grossly intact. Sensation intact, DTR normal. Strength unable to assess as patient is agitated.  Psychiatric: Unable to assess due to agitation, anxious mood.     Labs on Admission: I have personally reviewed following labs and imaging studies  CBC:  Recent Labs Lab 02/11/16 0458  02/12/16 0416 02/17/16 0530  WBC 7.5  --  13.9*  NEUTROABS  --   --  10.7*  HGB 10.6* 10.5* 13.2  HCT 33.3* 32.3* 41.5  MCV 91.2  --  95.2  PLT 198  --  589*    Basic Metabolic Panel:  Recent Labs Lab 02/11/16 0458 02/17/16 0530  NA 141 127*  K 3.8 4.9  CL 103 90*  CO2 28 <7*  GLUCOSE 149* 775*  BUN 8 22*  CREATININE 0.72 1.85*  CALCIUM 8.1* 9.1    GFR: Estimated Creatinine Clearance: 25 mL/min (by C-G formula based on Cr of 1.85).  Liver Function Tests:  Recent Labs Lab 02/17/16 0644  AST 25  ALT 13*  ALKPHOS 126  BILITOT 2.0*  PROT 5.6*  ALBUMIN 2.5*    Recent Labs Lab 02/17/16 0644  LIPASE 14   No results for input(s): AMMONIA in the last 168 hours.  Coagulation Profile: No results for input(s): INR, PROTIME in the last 168 hours.  Cardiac Enzymes:  Recent Labs Lab 02/17/16 0530  TROPONINI 0.03    BNP (last 3 results) No results for input(s): PROBNP in the last 8760 hours.  HbA1C: No results for input(s): HGBA1C in the last 72 hours.  CBG:  Recent Labs Lab 02/11/16 1707 02/11/16 2117 02/12/16 0822 02/12/16 1200 02/17/16 0751  GLUCAP 71 231* 108* 268* >600*    Lipid Profile: No results for input(s): CHOL, HDL, LDLCALC, TRIG, CHOLHDL, LDLDIRECT in the last 72 hours.  Thyroid Function Tests: No results for input(s): TSH, T4TOTAL, FREET4, T3FREE, THYROIDAB in the last 72 hours.  Anemia Panel: No results for input(s): VITAMINB12, FOLATE, FERRITIN, TIBC, IRON, RETICCTPCT in the last 72 hours.  Urine analysis:    Component Value Date/Time   COLORURINE YELLOW 02/17/2016 0712   APPEARANCEUR CLEAR 02/17/2016 0712   LABSPEC 1.023 02/17/2016 0712   LABSPEC 1.030 08/25/2015 1130   PHURINE 5.0 02/17/2016 0712   PHURINE 6.0 08/25/2015 1130   GLUCOSEU >1000* 02/17/2016 0712   GLUCOSEU Negative 08/25/2015 1130   HGBUR TRACE* 02/17/2016 0712   HGBUR Negative 08/25/2015 1130   BILIRUBINUR NEGATIVE 02/17/2016 0712   BILIRUBINUR  Negative 08/25/2015 1130   KETONESUR >80* 02/17/2016 0712   KETONESUR Negative 08/25/2015 1130   PROTEINUR 30* 02/17/2016 0712   PROTEINUR 30 08/25/2015 1130   UROBILINOGEN 0.2 08/25/2015 1130   UROBILINOGEN 0.2 07/01/2015 0345   NITRITE NEGATIVE  02/17/2016 0712   NITRITE Negative 08/25/2015 1130   LEUKOCYTESUR NEGATIVE 02/17/2016 0712   LEUKOCYTESUR Negative 08/25/2015 1130    Sepsis Labs: '@LABRCNTIP'$ (procalcitonin:4,lacticidven:4) ) Recent Results (from the past 240 hour(s))  MRSA PCR Screening     Status: None   Collection Time: 02/09/16  5:00 PM  Result Value Ref Range Status   MRSA by PCR NEGATIVE NEGATIVE Final    Comment:        The GeneXpert MRSA Assay (FDA approved for NASAL specimens only), is one component of a comprehensive MRSA colonization surveillance program. It is not intended to diagnose MRSA infection nor to guide or monitor treatment for MRSA infections.      Radiological Exams on Admission: Dg Chest Port 1 View  02/17/2016  CLINICAL DATA:  Dyspnea. History of hypertension. Previous right lung cancer. EXAM: PORTABLE CHEST 1 VIEW COMPARISON:  Chest 02/04/2016.  CT chest 11/30/2015 FINDINGS: Unchanged appearance of the chest with chronic volume loss in the right upper and middle lungs associated with mediastinal shift towards the right and hyperinflation of the left lung. No acute consolidation or airspace disease. No blunting of costophrenic angles. No pneumothorax. Heart size and pulmonary vascularity are normal. IMPRESSION: Chronic volume loss in the right middle and upper lungs. No evidence of active pulmonary disease. Electronically Signed   By: Lucienne Capers M.D.   On: 02/17/2016 05:52     Assessment/Plan Principal Problem:   DKA (diabetic ketoacidoses) ([place]) Active Problems:   Malignant neoplasm of right upper lobe of lung ([place])   Dehydration   DM type 2 (diabetes mellitus, type 2) ([facility])   Abnormal finding on GI tract imaging   Leukocytosis     Diabetic Ketoacidosis (DKA),Type 2 in the setting of type 2 diabetes mellitus with diabetic nephropathy/hyperglycemia, likely precipitated by recent hospitalization for colitis and L distal fibular fracture with ORIF 4/12  Admission Glucose was 775, Anion Gap >30, Lactic Acid 3.82, Bicarb 6.92 All other workup pending. S/p 2 L NS, then 125 cc/h, Insulin drip initiated  Admit for SDU IV Bolus NS, then aggressive hydration Bicarb drip Check Mg and Phos bid Potassium IV prn Glucomander. Serum Ketone Hold oral hypoglycemics UA with cultures  ABG repeat in 6 hrs Nothing by mouth for now.    Acute Respiratory Failure and wheezing, Kussmaul resps.History of Lung Cancer. CXR wNAD.  -Oxygen as needed  -After sputum cultures are obtained, consider antibiotic coverage with levaquin. -Procalcitonin -IV Nebulizers as needed, DuoNeb -Mucinex as needed  -IV fluids -antipyretics -Repeat CBC in am Code status to be readdressed as she may need intubation if symptoms do not improve or woersen  Acute kidney injury likely due to dehydration Peak creatinine 1.85      IFV -  Strict I/O and daily weights -  Renally dose medications.    Leukocytosis, likely related to underlying infection, rapid breathing. WBC 13.9 -  abx as above -  Repeat WBC in AM  History of Lung Cancer, Squamous Cell Ca, with possible liver mets, no active treatment To see Dr. Julien Nordmann soon as OP Will notify Oncology of patient's admission as courtesy  S/p Left Leg Fracture s/p ORIF 1/12 Dr. Ancil Boozer was on Rehab.  Will need wound care consult/OT/PT when stable   DVT prophylaxis: Heparin Code Status:   Full  Family Communication:  No family at bedside Disposition Plan: Expect patient to be >48 hrs Consults called:  None Admission status: inpatient  SDU    [facility] PA-C [facility]  If 7PM-7AM, please contact night-coverage [email or web address] Password [email or web address]  02/17/2016, 8:45 AM

## 2016-02-17 NOTE — Procedures (Signed)
Arterial Catheter Insertion Procedure Note Megan [name] [id number] 06-Dec-1940  Procedure: Insertion of Arterial Catheter  Indications: Blood pressure monitoring and Frequent blood sampling  Procedure Details Consent: Unable to obtain consent because of altered level of consciousness. Time Out: Verified patient identification, verified procedure, site/side was marked, verified correct patient position, special equipment/implants available, medications/allergies/relevent history reviewed, required imaging and test results available.  Performed  Maximum sterile technique was used including antiseptics, cap, gloves, gown, hand hygiene, mask and sheet. Skin prep: Chlorhexidine; local anesthetic administered 20 gauge catheter was inserted into right radial artery using the Seldinger technique.  Evaluation Blood flow good; BP tracing good. Complications: No apparent complications.   Clementeen Graham 02/17/2016 Erick Colace ACNP-BC [place] Pager # [phone] OR # [phone] if no answer

## 2016-02-17 NOTE — ED Notes (Signed)
Admitting MD at bedside, made aware of BP. Reports critical care will come see pt. Verbal order given for amp of bicarb.

## 2016-02-18 ENCOUNTER — Inpatient Hospital Stay (HOSPITAL_COMMUNITY): Payer: Medicare Other

## 2016-02-18 DIAGNOSIS — E860 Dehydration: Secondary | ICD-10-CM

## 2016-02-18 DIAGNOSIS — E0811 Diabetes mellitus due to underlying condition with ketoacidosis with coma: Secondary | ICD-10-CM

## 2016-02-18 DIAGNOSIS — E0810 Diabetes mellitus due to underlying condition with ketoacidosis without coma: Secondary | ICD-10-CM

## 2016-02-18 LAB — BASIC METABOLIC PANEL
Anion gap: 15 (ref 5–15)
BUN: 11 mg/dL (ref 6–20)
CALCIUM: 6.9 mg/dL — AB (ref 8.9–10.3)
CO2: 19 mmol/L — AB (ref 22–32)
Chloride: 102 mmol/L (ref 101–111)
Creatinine, Ser: 0.99 mg/dL (ref 0.44–1.00)
GFR, EST NON AFRICAN AMERICAN: 55 mL/min — AB (ref >60)
Glucose, Bld: 144 mg/dL — ABNORMAL HIGH (ref 65–99)
Potassium: 3.3 mmol/L — ABNORMAL LOW (ref 3.5–5.1)
Sodium: 136 mmol/L (ref 135–145)

## 2016-02-18 LAB — COMPREHENSIVE METABOLIC PANEL
ALBUMIN: 1.6 g/dL — AB (ref 3.5–5.0)
ALK PHOS: 75 U/L (ref 38–126)
ALT: 11 U/L — ABNORMAL LOW (ref 14–54)
ANION GAP: 16 — AB (ref 5–15)
AST: 14 U/L — ABNORMAL LOW (ref 15–41)
BILIRUBIN TOTAL: 1.1 mg/dL (ref 0.3–1.2)
BUN: 9 mg/dL (ref 6–20)
CO2: 19 mmol/L — ABNORMAL LOW (ref 22–32)
Calcium: 6.9 mg/dL — ABNORMAL LOW (ref 8.9–10.3)
Chloride: 103 mmol/L (ref 101–111)
Creatinine, Ser: 0.95 mg/dL (ref 0.44–1.00)
GFR calc non Af Amer: 58 mL/min — ABNORMAL LOW (ref >60)
GLUCOSE: 180 mg/dL — AB (ref 65–99)
Potassium: 3 mmol/L — ABNORMAL LOW (ref 3.5–5.1)
Sodium: 138 mmol/L (ref 135–145)
Total Protein: 3.6 g/dL — ABNORMAL LOW (ref 6.5–8.1)

## 2016-02-18 LAB — POCT I-STAT 3, ART BLOOD GAS (G3+)
ACID-BASE DEFICIT: 3 mmol/L — AB (ref 0.0–2.0)
ACID-BASE EXCESS: 1 mmol/L (ref 0.0–2.0)
Acid-base deficit: 2 mmol/L (ref 0.0–2.0)
BICARBONATE: 20.9 meq/L (ref 20.0–24.0)
BICARBONATE: 23.8 meq/L (ref 20.0–24.0)
Bicarbonate: 21.2 mEq/L (ref 20.0–24.0)
O2 SAT: 95 %
O2 Saturation: 97 %
O2 Saturation: 97 %
PCO2 ART: 30 mmHg — AB (ref 35.0–45.0)
PH ART: 7.459 — AB (ref 7.350–7.450)
PO2 ART: 72 mmHg — AB (ref 80.0–100.0)
PO2 ART: 79 mmHg — AB (ref 80.0–100.0)
Patient temperature: 98.7
TCO2: 22 mmol/L (ref 0–100)
TCO2: 22 mmol/L (ref 0–100)
TCO2: 25 mmol/L (ref 0–100)
pCO2 arterial: 30.8 mmHg — ABNORMAL LOW (ref 35.0–45.0)
pCO2 arterial: 31.6 mmHg — ABNORMAL LOW (ref 35.0–45.0)
pH, Arterial: 7.429 (ref 7.350–7.450)
pH, Arterial: 7.496 — ABNORMAL HIGH (ref 7.350–7.450)
pO2, Arterial: 90 mmHg (ref 80.0–100.0)

## 2016-02-18 LAB — CBC
HCT: 29.8 % — ABNORMAL LOW (ref 36.0–46.0)
HEMATOCRIT: 28.6 % — AB (ref 36.0–46.0)
HEMOGLOBIN: 9.9 g/dL — AB (ref 12.0–15.0)
Hemoglobin: 9.7 g/dL — ABNORMAL LOW (ref 12.0–15.0)
MCH: 28.6 pg (ref 26.0–34.0)
MCH: 29.2 pg (ref 26.0–34.0)
MCHC: 33.2 g/dL (ref 30.0–36.0)
MCHC: 33.9 g/dL (ref 30.0–36.0)
MCV: 86.1 fL (ref 78.0–100.0)
MCV: 86.1 fL (ref 78.0–100.0)
PLATELETS: 338 10*3/uL (ref 150–400)
Platelets: 300 10*3/uL (ref 150–400)
RBC: 3.46 MIL/uL — ABNORMAL LOW (ref 3.87–5.11)
RBC: 3.32 MIL/uL — AB (ref 3.87–5.11)
RDW: 14.5 % (ref 11.5–15.5)
RDW: 14.7 % (ref 11.5–15.5)
WBC: 6.2 10*3/uL (ref 4.0–10.5)
WBC: 5.7 10*3/uL (ref 4.0–10.5)

## 2016-02-18 LAB — GLUCOSE, CAPILLARY
GLUCOSE-CAPILLARY: 122 mg/dL — AB (ref 65–99)
GLUCOSE-CAPILLARY: 125 mg/dL — AB (ref 65–99)
GLUCOSE-CAPILLARY: 141 mg/dL — AB (ref 65–99)
GLUCOSE-CAPILLARY: 144 mg/dL — AB (ref 65–99)
GLUCOSE-CAPILLARY: 151 mg/dL — AB (ref 65–99)
GLUCOSE-CAPILLARY: 152 mg/dL — AB (ref 65–99)
GLUCOSE-CAPILLARY: 155 mg/dL — AB (ref 65–99)
GLUCOSE-CAPILLARY: 157 mg/dL — AB (ref 65–99)
GLUCOSE-CAPILLARY: 162 mg/dL — AB (ref 65–99)
GLUCOSE-CAPILLARY: 185 mg/dL — AB (ref 65–99)
GLUCOSE-CAPILLARY: 223 mg/dL — AB (ref 65–99)
GLUCOSE-CAPILLARY: 249 mg/dL — AB (ref 65–99)
Glucose-Capillary: 180 mg/dL — ABNORMAL HIGH (ref 65–99)
Glucose-Capillary: 162 mg/dL — ABNORMAL HIGH (ref 65–99)
Glucose-Capillary: 151 mg/dL — ABNORMAL HIGH (ref 65–99)
Glucose-Capillary: 159 mg/dL — ABNORMAL HIGH (ref 65–99)
Glucose-Capillary: 148 mg/dL — ABNORMAL HIGH (ref 65–99)
Glucose-Capillary: 163 mg/dL — ABNORMAL HIGH (ref 65–99)
Glucose-Capillary: 161 mg/dL — ABNORMAL HIGH (ref 65–99)
Glucose-Capillary: 162 mg/dL — ABNORMAL HIGH (ref 65–99)

## 2016-02-18 LAB — MAGNESIUM: MAGNESIUM: 1.8 mg/dL (ref 1.7–2.4)

## 2016-02-18 LAB — URINE CULTURE: CULTURE: NO GROWTH

## 2016-02-18 LAB — PROTIME-INR
INR: 1.11 (ref 0.00–1.49)
Prothrombin Time: 14.5 seconds (ref 11.6–15.2)

## 2016-02-18 LAB — TROPONIN I: TROPONIN I: 0.06 ng/mL — AB (ref <0.031)

## 2016-02-18 LAB — PHOSPHORUS: Phosphorus: 1.6 mg/dL — ABNORMAL LOW (ref 2.5–4.6)

## 2016-02-18 MED ORDER — INSULIN ASPART 100 UNIT/ML ~~LOC~~ SOLN
0.0000 [IU] | Freq: Three times a day (TID) | SUBCUTANEOUS | Status: DC
  Administered 2016-02-19 (×2): 5 [IU] via SUBCUTANEOUS
  Administered 2016-02-19: 3 [IU] via SUBCUTANEOUS
  Administered 2016-02-20: 1 [IU] via SUBCUTANEOUS

## 2016-02-18 MED ORDER — SODIUM CHLORIDE 0.45 % IV SOLN: INTRAVENOUS | Status: AC

## 2016-02-18 MED ORDER — INSULIN ASPART 100 UNIT/ML ~~LOC~~ SOLN: 0.0000 [IU] | Freq: Every day | SUBCUTANEOUS | Status: DC

## 2016-02-18 MED ORDER — INSULIN GLARGINE 100 UNIT/ML ~~LOC~~ SOLN
15.0000 [IU] | Freq: Every day | SUBCUTANEOUS | Status: DC
  Administered 2016-02-18 – 2016-02-20 (×3): 15 [IU] via SUBCUTANEOUS
  Filled 2016-02-18 (×3): qty 0.15

## 2016-02-18 MED ORDER — DEXTROSE 5 % IV SOLN: 10.0000 mmol | Freq: Once | INTRAVENOUS | Status: DC

## 2016-02-18 MED ORDER — INSULIN ASPART 100 UNIT/ML ~~LOC~~ SOLN: 0.0000 [IU] | SUBCUTANEOUS | Status: DC

## 2016-02-18 MED ORDER — SODIUM PHOSPHATE 3 MMOLE/ML IV SOLN
30.0000 mmol | Freq: Once | INTRAVENOUS | Status: AC
  Administered 2016-02-18: 30 mmol via INTRAVENOUS
  Filled 2016-02-18: qty 10

## 2016-02-18 MED ORDER — VANCOMYCIN HCL IN DEXTROSE 750-5 MG/150ML-% IV SOLN
750.0000 mg | Freq: Two times a day (BID) | INTRAVENOUS | Status: DC
  Filled 2016-02-18 (×2): qty 150

## 2016-02-18 MED ORDER — ENOXAPARIN SODIUM 40 MG/0.4ML ~~LOC~~ SOLN
40.0000 mg | SUBCUTANEOUS | Status: DC
  Administered 2016-02-18 – 2016-02-19 (×2): 40 mg via SUBCUTANEOUS
  Filled 2016-02-18 (×3): qty 0.4

## 2016-02-18 MED ORDER — POTASSIUM CHLORIDE CRYS ER 20 MEQ PO TBCR
40.0000 meq | EXTENDED_RELEASE_TABLET | ORAL | Status: AC
  Administered 2016-02-18: 40 meq via ORAL
  Filled 2016-02-18 (×2): qty 2

## 2016-02-18 NOTE — Progress Notes (Signed)
PULMONARY / CRITICAL CARE MEDICINE   Name: CYAN CLIPPINGER MRN: [id number] DOB: [birth year]    ADMISSION DATE:  02/17/2016 CONSULTATION DATE:  02/17/2016  REFERRING MD:  Elgergawy   CHIEF COMPLAINT: DKA and Dyspnea   HISTORY OF PRESENT ILLNESS:   75 year old female with sign medical history of DM2, Stage III squamous cell lung CA of middle lobe (not currently on therapy) with possible mets to liver, chemotherapy induced neuropathy, RA, orthostatic hypotension, and emphysema . Recent hospitalized from 4/10 - 4/14 after a fall with left distal fibula fracture status post surgical repair by Dr. Sharol Given and colitis. Presents 4/19 to ED from SNF after being found to have increased SOB. In ED, in acute respiratory distress and altered mental status. CXR revealed chronic interstitial disease with no acute findings however patient was found to be in DKA. PCCM to admit.    SUBJECTIVE:  Doing much better, sitting up in chair.  Has loose stools overnight  VITAL SIGNS: BP 105/62 mmHg  Pulse 112  Temp(Src) 98.7 F (37.1 C) (Oral)  Resp 17  SpO2 97%  HEMODYNAMICS:    VENTILATOR SETTINGS:    INTAKE / OUTPUT: I/O last 3 completed shifts: In: 7202.2 [I.V.:6442.2; IV Piggyback:760] Out: 5621 [Urine:1505]  PHYSICAL EXAMINATION: General: Adult female, sitting in chair Neuro: lethargic, follows commands  HEENT: PERRL, atraumatic, normocephalic  Cardiovascular:  No MRG, regular rate regular rhythm Lungs: clear, no wheezing, no crackles or rales.   Abdomen: Active bowel sounds, no tenderness, no distention  Musculoskeletal: Left leg boot, sutures to left lower leg - dry, intact Skin: warm dry  LABS:  BMET  Recent Labs Lab 02/17/16 1957 02/18/16 0020 02/18/16 0845  NA 136 136 138  K 3.6 3.3* 3.0*  CL 106 102 103  CO2 17* 19* 19*  BUN '15 11 9  ' CREATININE 1.05* 0.99 0.95  GLUCOSE 186* 144* 180*    Electrolytes  Recent Labs Lab 02/17/16 1320  02/17/16 1957 02/18/16 0020  02/18/16 0755 02/18/16 0845  CALCIUM  --   < > 7.0* 6.9*  --  6.9*  MG 1.6*  --  2.5*  --  1.8  --   PHOS 2.1*  --  3.0  --  1.6*  --   < > = values in this interval not displayed.  CBC  Recent Labs Lab 02/17/16 0530 02/18/16 0305 02/18/16 0845  WBC 13.9* 6.2 5.7  HGB 13.2 9.9* 9.7*  HCT 41.5 29.8* 28.6*  PLT 589* 338 300    Coag's  Recent Labs Lab 02/18/16 0845  INR 1.11    Sepsis Markers  Recent Labs Lab 02/17/16 1001 02/17/16 1105  LATICACIDVEN 3.65* 1.56    ABG  Recent Labs Lab 02/18/16 0049 02/18/16 0403 02/18/16 1012  PHART 7.429 7.459* 7.496*  PCO2ART 31.6* 30.0* 30.8*  PO2ART 90.0 72.0* 79.0*    Liver Enzymes  Recent Labs Lab 02/17/16 0644 02/18/16 0845  AST 25 14*  ALT 13* 11*  ALKPHOS 126 75  BILITOT 2.0* 1.1  ALBUMIN 2.5* 1.6*    Cardiac Enzymes  Recent Labs Lab 02/17/16 1320 02/17/16 1957 02/18/16 0755  TROPONINI 0.04* 0.07* 0.06*    Glucose  Recent Labs Lab 02/18/16 0438 02/18/16 0542 02/18/16 0639 02/18/16 0743 02/18/16 0845 02/18/16 0953  GLUCAP 152* 162* 159* 157* 155* 148*    Imaging Portable Chest X-ray (1 View)  02/18/2016  CLINICAL DATA:  Short of breath EXAM: PORTABLE CHEST 1 VIEW COMPARISON:  Radiograph 02/17/2016 FINDINGS: Patient is rotated rightward.  There is collapse of the RIGHT middle lobe again demonstrated. Mediastinal shift to the RIGHT. No acute osseous findings. LEFT lung is clear. IMPRESSION: 1. No acute cardiopulmonary findings. 2. Chronic collapse the RIGHT middle lobe. Electronically Signed   By: Suzy Bouchard M.D.   On: 02/18/2016 07:17     STUDIES:  4/19 CXR : No acute findings   CULTURES: Urine 4/19 >> Blood x 2 4/19 >>  ANTIBIOTICS: Vancomycin 4/19 >> Zoysn 4/19 >>   SIGNIFICANT EVENTS: 4/14 : D/C from hospital post surgical repair of left distal fibula fracture  4/19: presents ED with SOB from SNF   LINES/TUBES: Foley 4/19 >>  Right radial aline  4/19>>>  DISCUSSION: 75 year old female presented to ED on 4/19 with SOB found to be in Compensating DKA complicated by underlying lung CA and emphysema. Will admit to ICU to monitor closely for respiratory decompensation and DKA protocol.   ASSESSMENT / PLAN:  PULMONARY A:  Tachypnea in setting of metabolic acidosis and DKA- improving with resolution of DKA H/O Stage III Squamous Cell Lung CA, Emphysema  P:   Alk noted from pain? , prior lobectomy,  Dc aline No more abg  CARDIOVASCULAR A:  SIRS vs sepsis  Circulatory shock, hypovolemia vs sepsis  Troponin leak P:  Dc trop Dc tele  RENAL A:   Acute Kidney Injury -resolving Lactic Acidosis - resolved Hyponatremia- 2/2 glucose  Hypokalemia Hypophosphatemia P:   Serial BMP  Electrolyte replacement as needed  Allow pos baalnce  GASTROINTESTINAL A:   H/O colitis   P:   NPO, start diet once off drip PPI  If further loose stools check cdiff  HEMATOLOGIC A:  H/O Stage III Squamous Cell CA with possible Mets to Liver  P:  Daily CBC  [place] heparin dc, add lovenox  INFECTIOUS A:  R/o Septic Shock unknown source  Leukocytosis  Hypothermia  R/o gastroenteritis P:   PAN cultures  Trend WBC and Fever curve  Dc Vancomycin Zoysn (see above) Pct now and in am   ENDOCRINE A:   DKA- gap closing and acidosis resolving H/O DM 2     P:   insulin gtt, transition off drip soon when AG resolves POC BG checks potassium/phos monitoring electrolyte repletion protocol  NEUROLOGIC A:   Acute Metabolic Encephalopathy -improved  P:   DKA treatment  RASS goal: 0 Hold all sedating meds   FAMILY  - Updates: family and pt in room  - Inter-disciplinary family meet or Palliative Care meeting due by: 02/24/2016   Lucious Groves, DO IMTS PGY-3 02/18/2016, 10:53 AM   STAFF NOTE: Linwood Dibbles, MD FACP have personally reviewed patient's available data, including medical history, events of note, physical  examination and test results as part of my evaluation. I have discussed with resident/NP and other care providers such as pharmacist, RN and RRT. In addition, I personally evaluated patient and elicited key findings of: in chair, alert, bicarb above 18, transition off drip with lantus, SSI, diet, NOT impressed for infection at all, pct, dc vanc, keep zosyn for now, likley to dc all ABX in am , allow pos balance still, resp alk from agitation / pain, restriction fro lobectomy, dc abg, dc a line, to [facility], med floor   Lavon Paganini. Titus Mould, MD, [place] Pgr: [facility] 02/18/2016 11:14 AM  '

## 2016-02-18 NOTE — Progress Notes (Signed)
Pharmacy Antibiotic Note  Megan [name] is a 75 y.o. female admitted on 02/17/2016 with sepsis.  Pharmacy has been consulted for vancomycin and zosyn dosing. Renal function improved. SCr 0.95 CrCl ~ 49.   Plan: - Inc vancomycin to '750mg'$  IV Q12H - Zosyn 3.375gm IV Q8H (4hf inf) - F/u renal fxn, C&S, clinical status and trough at SS     Temp (24hrs), Avg:96.8 F (36 C), Min:94.5 F (34.7 C), Max:98.7 F (37.1 C)   Recent Labs Lab 02/17/16 0530  02/17/16 1001 02/17/16 1105 02/17/16 1205 02/17/16 1605 02/17/16 1957 02/18/16 0020 02/18/16 0305 02/18/16 0845  WBC 13.9*  --   --   --   --   --   --   --  6.2 5.7  CREATININE 1.85*  < >  --   --  1.45* 1.16* 1.05* 0.99  --  0.95  LATICACIDVEN  --   --  3.65* 1.56  --   --   --   --   --   --   < > = values in this interval not displayed.  Estimated Creatinine Clearance: 48.6 mL/min (by C-G formula based on Cr of 0.95).    Allergies  Allergen Reactions  . Carboplatin Other (See Comments)    Neuropathy  . Remicade [Infliximab] Anaphylaxis  . Hydromorphone Rash    Antimicrobials this admission: Vanc 4/19>> Zosyn 4/19>>  Microbiology results: 4/19 blood cx x 2 >> 4/19 urine cx >>  Thank you for allowing pharmacy to be a part of this patient's care.  Myrene Galas, PharmD 02/18/2016 10:53 AM

## 2016-02-18 NOTE — Progress Notes (Signed)
CSW received T/C from [place] [phone]) at [place] at [facility]. She reports that Patient was receiving rehab s/p left ankle fracture surgery. [facility] reports that they are reserving bed for Patient's return. CSW will continue to follow for disposition when medically cleared.     Emiliano Dyer, LCSW [facility] ED/20M Clinical Social Worker [phone]

## 2016-02-18 NOTE — Evaluation (Signed)
Physical Therapy Evaluation Patient Details Name: Megan [name] MRN: [id number] DOB: [birth year] Today's Date: 02/18/2016   History of Present Illness  pt presents with DKA with recent admit for L ankle fx s/p ORIF on 02/10/16.  pt with hx of Lung CA with liver mets, Emphysema, DM, Depression, Neuropathy, R ankle fx, and RA.    Clinical Impression  Pt is oriented to location, but not situation.  Pt is difficult to stay on topic at times and needs gentle re-direction.  Unclear if this is pts baseline as she had been living alone with a "caretaker" prior to her recent admit for an ankle fx.  At this time feel pt needs to return to SNF level of care.  Will continue to follow.      Follow Up Recommendations SNF    Equipment Recommendations  None recommended by PT    Recommendations for Other Services       Precautions / Restrictions Precautions Precautions: Fall Required Braces or Orthoses: Other Brace/Splint Other Brace/Splint: CAM boot on when up Restrictions Weight Bearing Restrictions: Yes LLE Weight Bearing: Touchdown weight bearing      Mobility  Bed Mobility Overal bed mobility: Needs Assistance Bed Mobility: Supine to Sit;Rolling Rolling: Min assist   Supine to sit: Mod assist;HOB elevated     General bed mobility comments: A with L LE and bringing hips towards EOB.    Transfers Overall transfer level: Needs assistance Equipment used: 2 person hand held assist Transfers: Sit to/from [facility] Sit to Stand: Mod assist;+2 physical assistance Stand pivot transfers: Mod assist;+2 physical assistance       General transfer comment: pt not maintaining TDWBing on L LE despite cueing.  pt tends to take pivotal steps towards recliner.    Ambulation/Gait                Stairs            Wheelchair Mobility    Modified Rankin (Stroke Patients Only)       Balance Overall balance assessment: Needs assistance Sitting-balance  support: No upper extremity supported;Feet supported Sitting balance-Leahy Scale: Fair     Standing balance support: During functional activity Standing balance-Leahy Scale: Poor                               Pertinent Vitals/Pain Pain Assessment: Faces Faces Pain Scale: Hurts a little bit Pain Location: pt indicates discomfort in bed. Pain Descriptors / Indicators: Discomfort Pain Intervention(s): Repositioned;Monitored during session    Home Living Family/patient expects to be discharged to:: Skilled nursing facility                 Additional Comments: pt had been at [facility] since D/C on 02/12/16.    Prior Function Level of Independence: Needs assistance         Comments: Unclear level of A pt was needing since D/C to [place].       Hand Dominance   Dominant Hand: Right    Extremity/Trunk Assessment   Upper Extremity Assessment: Generalized weakness           Lower Extremity Assessment: LLE deficits/detail   LLE Deficits / Details: Lower leg in cast boot from recent ankle fx.  pt strength grossly 2/5.    Cervical / Trunk Assessment: Normal  Communication   Communication: HOH  Cognition Arousal/Alertness: Awake/alert Behavior During Therapy: WFL for tasks assessed/performed Overall Cognitive Status: No family/caregiver  present to determine baseline cognitive functioning                      General Comments      Exercises        Assessment/Plan    PT Assessment Patient needs continued PT services  PT Diagnosis Difficulty walking;Acute pain;Generalized weakness   PT Problem List Decreased strength;Decreased balance;Decreased activity tolerance;Decreased mobility;Decreased coordination;Decreased cognition;Decreased knowledge of use of DME;Decreased safety awareness;Decreased knowledge of precautions;Impaired sensation  PT Treatment Interventions DME instruction;Gait training;Functional mobility training;Therapeutic  activities;Therapeutic exercise;Balance training;Neuromuscular re-education;Patient/family education;Cognitive remediation   PT Goals (Current goals can be found in the Care Plan section) Acute Rehab PT Goals Patient Stated Goal: Return home eventually PT Goal Formulation: With patient Time For Goal Achievement: 03/03/16 Potential to Achieve Goals: Good    Frequency Min 2X/week   Barriers to discharge        Co-evaluation               End of Session Equipment Utilized During Treatment: Gait belt (L cam boot) Activity Tolerance: Patient limited by fatigue Patient left: in chair;with call bell/phone within reach;with chair alarm set Nurse Communication: Mobility status         Time: 6270-3500 PT Time Calculation (min) (ACUTE ONLY): 31 min   Charges:   PT Evaluation $PT Eval Moderate Complexity: 1 Procedure PT Treatments $Therapeutic Activity: 8-22 mins   PT G CodesCatarina Hartshorn, [place] [phone] 02/18/2016, 10:51 AM

## 2016-02-18 NOTE — Progress Notes (Addendum)
Patient arrived on unit from [facility].  No family at bedside. Patient has an outside heart monitor on that she was wearing prior to being hospitalized.  Left AC IV infusing and covered with BP cuff.  BP cuff removed.  IV infiltrated and blister noted on arm above AC.  Telemetry placed per MD order and CMT notified.

## 2016-02-18 NOTE — Clinical Documentation Improvement (Signed)
Critical Care  Abnormal Lab/Test Results:   Potassium: 4/20: 3.0. 4/20: 3.3.   Possible Clinical Conditions associated with below indicators  Hypokalemia  Other Condition  Cannot Clinically Determine   Treatment Provided: 02/18/16:  Potassium chloride 40 meq po.   Please exercise your independent, professional judgment when responding. A specific answer is not anticipated or expected.   Thank You,  [place] [phone]

## 2016-02-18 NOTE — Progress Notes (Signed)
Patient states that she wants to report that her medication for her ulcerative colitis has not been working and therefore does not want to take it anymore.

## 2016-02-18 NOTE — Progress Notes (Signed)
Pt has has 3 large loose stools, last stool had portions that appeared bright red and were bloody. Blood was moderate in amount. Pt states history of colitis, and chart says recent d/c with colitis last week. Also had 3-4 large kidney-bean colored pills in stool. Resident Dr Guy Franco notified of these findings. Stat CBC ordered and collected. No other orders given at this time. Will continue to monitor pt closely.

## 2016-02-18 NOTE — Progress Notes (Signed)
Upon assessment patient noted to be wearing a cardiac monitor. Patient states that she is to wear it for 30 days per her cardiologist.

## 2016-02-19 LAB — GLUCOSE, CAPILLARY
GLUCOSE-CAPILLARY: 187 mg/dL — AB (ref 65–99)
GLUCOSE-CAPILLARY: 176 mg/dL — AB (ref 65–99)
Glucose-Capillary: 216 mg/dL — ABNORMAL HIGH (ref 65–99)
Glucose-Capillary: 236 mg/dL — ABNORMAL HIGH (ref 65–99)

## 2016-02-19 LAB — MAGNESIUM: MAGNESIUM: 1.7 mg/dL (ref 1.7–2.4)

## 2016-02-19 LAB — PHOSPHORUS: Phosphorus: 2.1 mg/dL — ABNORMAL LOW (ref 2.5–4.6)

## 2016-02-19 MED ORDER — POTASSIUM PHOSPHATES 15 MMOLE/5ML IV SOLN
40.0000 meq | Freq: Once | INTRAVENOUS | Status: DC
  Filled 2016-02-19: qty 9.09

## 2016-02-19 MED ORDER — MAGNESIUM SULFATE 2 GM/50ML IV SOLN
2.0000 g | Freq: Once | INTRAVENOUS | Status: AC
  Administered 2016-02-19: 2 g via INTRAVENOUS
  Filled 2016-02-19: qty 50

## 2016-02-19 MED ORDER — POTASSIUM CHLORIDE CRYS ER 20 MEQ PO TBCR
20.0000 meq | EXTENDED_RELEASE_TABLET | Freq: Once | ORAL | Status: AC
  Administered 2016-02-19: 20 meq via ORAL
  Filled 2016-02-19: qty 1

## 2016-02-19 MED ORDER — POTASSIUM & SODIUM PHOSPHATES 280-160-250 MG PO PACK
1.0000 | PACK | Freq: Three times a day (TID) | ORAL | Status: AC
  Administered 2016-02-19 (×3): 1 via ORAL
  Filled 2016-02-19 (×3): qty 1

## 2016-02-19 NOTE — Telephone Encounter (Signed)
Per the pharmacy the prior auth has not been approved yet for the Asacol.

## 2016-02-19 NOTE — Progress Notes (Signed)
Inpatient Diabetes Program Recommendations  AACE/ADA: New Consensus Statement on Inpatient Glycemic Control (2015)  Target Ranges:  Prepandial:   less than 140 mg/dL      Peak postprandial:   less than 180 mg/dL (1-2 hours)      Critically ill patients:  140 - 180 mg/dL   Review of Glycemic Control  Diabetes history: DM 2 Outpatient Diabetes medications: Levemir 34 units, Novolog Moderate TID Current orders for Inpatient glycemic control: Lantus 15 units, Novolog Moderate + HS  Inpatient Diabetes Program Recommendations:   Insulin - Basal: Glucose >200 mg/dl this am. Patient takes Levemir 34 units at home. Please increase Levemir to 20 units Daily. Correction (SSI): Patient has 2 correction scales ordered, Please d/c one.   Thanks,  Tama Headings RN, MSN, [facility] Inpatient Diabetes Coordinator Team Pager [phone] (8a-5p)

## 2016-02-19 NOTE — Progress Notes (Signed)
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 Recent hospitalized from 4/10 - 4/14 after a fall with left distal fibula fracture status post surgical repair by Dr. Sharol Given and colitis. Presents 4/19 to ED from SNF after being found to have increased SOB. In ED, in acute respiratory distress and altered mental status. CXR revealed chronic interstitial disease with no acute findings however patient was found to be in DKA. PCCM to admit.    SUBJECTIVE:  Pt reports feeling better.  Notes cough but nothing comes up.  No acute events.  She anticipates going back to [facility] soon.    VITAL SIGNS: BP 128/64 mmHg  Pulse 92  Temp(Src) 98 F (36.7 C) (Oral)  Resp 20  Wt 160 lb 15 oz (73 kg)  SpO2 99%  INTAKE / OUTPUT: I/O last 3 completed shifts: In: 6093.7 [P.O.:120; I.V.:5613.7; IV Piggyback:360] Out: 600 [Urine:600]  PHYSICAL EXAMINATION: General:  Adult female in NAD, sitting up in bed Neuro: AAOx4, speech clear, MAE HEENT: PERRL, atraumatic, normocephalic  Cardiovascular:  No MRG, regular rate regular rhythm Lungs: non-labored, lungs bilaterally with rhonchi Abdomen: Active bowel sounds, no tenderness, no distention  Musculoskeletal: Left leg boot, sutures to left lower leg - dry, intact Skin: warm dry  LABS:  BMET  Recent Labs Lab 02/17/16 1957 02/18/16 0020 02/18/16 0845  NA 136 136 138  K 3.6 3.3* 3.0*  CL 106 102 103  CO2 17* 19* 19*  BUN '15 11 9  '$ CREATININE 1.05* 0.99 0.95  GLUCOSE 186* 144* 180*    Electrolytes  Recent  Labs Lab 02/17/16 1957 02/18/16 0020 02/18/16 0755 02/18/16 0845 02/19/16 0559  CALCIUM 7.0* 6.9*  --  6.9*  --   MG 2.5*  --  1.8  --  1.7  PHOS 3.0  --  1.6*  --  2.1*    CBC  Recent Labs Lab 02/17/16 0530 02/18/16 0305 02/18/16 0845  WBC 13.9* 6.2 5.7  HGB 13.2 9.9* 9.7*  HCT 41.5 29.8* 28.6*  PLT 589* 338 300    Coag's  Recent Labs Lab 02/18/16 0845  INR 1.11    Sepsis Markers  Recent Labs Lab 02/17/16 1001 02/17/16 1105  LATICACIDVEN 3.65* 1.56    ABG  Recent Labs Lab 02/18/16 0049 02/18/16 0403 02/18/16 1012  PHART 7.429 7.459* 7.496*  PCO2ART 31.6* 30.0* 30.8*  PO2ART 90.0 72.0* 79.0*    Liver Enzymes  Recent Labs Lab 02/17/16 0644 02/18/16 0845  AST 25 14*  ALT 13* 11*  ALKPHOS 126 75  BILITOT 2.0* 1.1  ALBUMIN 2.5* 1.6*    Cardiac Enzymes  Recent Labs Lab 02/17/16 1320 02/17/16 1957 02/18/16 0755  TROPONINI 0.04* 0.07* 0.06*    Glucose  Recent Labs Lab 02/18/16 1053 02/18/16 1140 02/18/16 1234 02/18/16 1516 02/18/16 1719 02/18/16 2020  GLUCAP 163* 161* 162* 223* 249* 185*    Imaging No results found.   STUDIES:  4/19 CXR : No acute findings   CULTURES: Urine 4/19 >> Blood x 2 4/19 >>  ANTIBIOTICS: Vancomycin 4/19 >> 4/20 Zoysn 4/19 >>  SIGNIFICANT EVENTS: 4/14  D/C from hospital post surgical repair of left distal fibula fracture  4/19  presents ED with SOB from SNF  4/20  Tx out of ICU to med surg  LINES/TUBES: Foley 4/19 >>  Right radial aline 4/19 >> 4/20  DISCUSSION: 75 year old female presented to ED on 4/19 with SOB found to be in Compensating DKA complicated by underlying lung CA and emphysema. Admitted to ICU to monitor closely for respiratory decompensation and DKA protocol. DKA resolved, transferred out of ICU. Cultures negative thus far.    ASSESSMENT / PLAN:  PULMONARY A:  Tachypnea in setting of metabolic acidosis and DKA - resolved with resolution of DKA H/O Stage III  Squamous Cell Lung CA, Emphysema  P:   Pulmonary hygiene - IS, mobilize   CARDIOVASCULAR A:  SIRS vs sepsis - resolved Circulatory shock, hypovolemia vs sepsis - resolved, suspect hypovolemia.  Cultures negative Troponin leak P:  No acute interventions  RENAL A:   Acute Kidney Injury - resolving Lactic Acidosis - resolved Hyponatremia- 2/2 glucose  Hypokalemia Hypophosphatemia P:   Monitor BMP / UOP  Replace electrolytes as indicated  Replace K+ Phosphorus 4/21  GASTROINTESTINAL A:   H/O colitis   P:   Diet as tolerated  PPI   HEMATOLOGIC A:  H/O Stage III Squamous Cell CA with possible Mets to Liver  P:  Trend CBC  Lovenox for DVT prophylaxis   INFECTIOUS A:  R/o Septic Shock unknown source - doubt infection, suspect volume depletion with DKA Leukocytosis  Hypothermia  R/o gastroenteritis P:   Follow cultures as above  Trend WBC and fever curve  Zoysn as above  ENDOCRINE A:   DKA - gap closed and acidosis resolved H/O DM 2     P:   SSI with ACHS CBG   NEUROLOGIC A:   Acute Metabolic Encephalopathy - resolved  P:   DKA treatment  RASS goal: 0 Hold all sedating meds   FAMILY  - Updates:  Patient updated at bedside.  She brings up conversation regarding how anxious she felt on admission and that she "wants to change her papers" indicating she would not want to be on machines "at the end if it is severe".  She is unable to clearly state if she would want to be DNR at this time.  Will continue further discussions.    - Inter-disciplinary family meet or Palliative Care meeting due by: 02/24/2016  GLOBAL:  Have asked social work to review for possible discharge in am 4/22 pending lab review back to [facility].     Noe Gens, NP-C [facility] Pgr: [phone] or if no answer [phone] 02/19/2016, 9:25 AM

## 2016-02-19 NOTE — Care Management Important Message (Signed)
Important Message  Patient Details  Name: Megan [name] MRN: [id number] Date of Birth: Dec 15, 1940   Medicare Important Message Given:  Yes    [name] 02/19/2016, 1:36 PM

## 2016-02-20 DIAGNOSIS — C3411 Malignant neoplasm of upper lobe, right bronchus or lung: Secondary | ICD-10-CM

## 2016-02-20 DIAGNOSIS — G9340 Encephalopathy, unspecified: Secondary | ICD-10-CM

## 2016-02-20 LAB — BASIC METABOLIC PANEL
Anion gap: 9 (ref 5–15)
CO2: 27 mmol/L (ref 22–32)
CREATININE: 0.68 mg/dL (ref 0.44–1.00)
Calcium: 7.6 mg/dL — ABNORMAL LOW (ref 8.9–10.3)
Chloride: 103 mmol/L (ref 101–111)
GFR calc Af Amer: >60 mL/min (ref >60)
GLUCOSE: 160 mg/dL — AB (ref 65–99)
POTASSIUM: 3.3 mmol/L — AB (ref 3.5–5.1)
SODIUM: 139 mmol/L (ref 135–145)

## 2016-02-20 LAB — GLUCOSE, CAPILLARY
Glucose-Capillary: 131 mg/dL — ABNORMAL HIGH (ref 65–99)
Glucose-Capillary: 143 mg/dL — ABNORMAL HIGH (ref 65–99)

## 2016-02-20 LAB — CBC
HCT: 31.2 % — ABNORMAL LOW (ref 36.0–46.0)
HEMOGLOBIN: 10 g/dL — AB (ref 12.0–15.0)
MCH: 28.9 pg (ref 26.0–34.0)
MCHC: 32.1 g/dL (ref 30.0–36.0)
MCV: 90.2 fL (ref 78.0–100.0)
PLATELETS: 292 10*3/uL (ref 150–400)
RBC: 3.46 MIL/uL — AB (ref 3.87–5.11)
RDW: 15.3 % (ref 11.5–15.5)
WBC: 5.9 10*3/uL (ref 4.0–10.5)

## 2016-02-20 LAB — MAGNESIUM: Magnesium: 1.9 mg/dL (ref 1.7–2.4)

## 2016-02-20 LAB — PHOSPHORUS: Phosphorus: 2.3 mg/dL — ABNORMAL LOW (ref 2.5–4.6)

## 2016-02-20 MED ORDER — MAGNESIUM SULFATE 2 GM/50ML IV SOLN
2.0000 g | Freq: Once | INTRAVENOUS | Status: AC
  Administered 2016-02-20: 2 g via INTRAVENOUS
  Filled 2016-02-20: qty 50

## 2016-02-20 MED ORDER — INSULIN DETEMIR 100 UNIT/ML ~~LOC~~ SOLN: 20.0000 [IU] | Freq: Every day | SUBCUTANEOUS | Status: DC

## 2016-02-20 MED ORDER — IPRATROPIUM-ALBUTEROL 0.5-2.5 (3) MG/3ML IN SOLN: 3.0000 mL | RESPIRATORY_TRACT | Status: DC | PRN

## 2016-02-20 MED ORDER — HYDROCODONE-ACETAMINOPHEN 5-325 MG PO TABS: 1.0000 | ORAL_TABLET | Freq: Four times a day (QID) | ORAL | Status: DC | PRN

## 2016-02-20 MED ORDER — POTASSIUM CHLORIDE CRYS ER 20 MEQ PO TBCR: 40.0000 meq | EXTENDED_RELEASE_TABLET | Freq: Once | ORAL | Status: DC

## 2016-02-20 MED ORDER — POTASSIUM & SODIUM PHOSPHATES 280-160-250 MG PO PACK
2.0000 | PACK | Freq: Once | ORAL | Status: AC
  Administered 2016-02-20: 2 via ORAL
  Filled 2016-02-20: qty 2

## 2016-02-20 NOTE — Progress Notes (Signed)
Report called to [place] at [place] at [place].  All questions answered.

## 2016-02-20 NOTE — Progress Notes (Signed)
AVS & prescriptions given to patient.

## 2016-02-20 NOTE — Clinical Social Work Note (Signed)
Patient to be d/c'ed today to [facility].  Patient and family agreeable to plans will transport via personal vehicle, RN to call report to [phone].  Evette Cristal, MSW, [place]

## 2016-02-20 NOTE — Discharge Summary (Signed)
PATIENT DETAILS Name: Megan [name] Age: 75 y.o. Sex: female Date of Birth: 17-Dec-1940 MRN: [id number]. Admitting Physician: Brand Males, MD ZMO:[id number] DENNIS, MD  Admit Date: 02/17/2016 Discharge date: 02/20/2016  Recommendations for Outpatient Follow-up:  1. Please ensure follow-up with orthopedics, gastroenterology, oncology and cardiology (see below)  2. Please repeat CBC/BMET in 1 week 3. Please follow blood cultures till final 4. Optimize insulin regimen.  PRIMARY DISCHARGE DIAGNOSIS:  Principal Problem:   DKA (diabetic ketoacidoses) (HCC) Active Problems:   Malignant neoplasm of right upper lobe of lung (HCC)   Dehydration   DM type 2 (diabetes mellitus, type 2) (HCC)   Abnormal finding on GI tract imaging   Leukocytosis   Diabetic ketoacidosis with coma associated with diabetes mellitus due to underlying condition [facility])      PAST MEDICAL HISTORY: Past Medical History  Diagnosis Date  . Hypertension   . Rheumatoid arthritis ([place])   . GERD (gastroesophageal reflux disease)     no meds for  . Hx of radiation therapy 12/16/13-01/30/14    lung 66Gy  . Neuropathy ([place])   . Shortness of breath dyspnea     with exertion  . History of hiatal hernia   . Diabetes mellitus     insulin  . Cancer ([place]) dx'd 09/2013    Lung ca  . Malignant neoplasm of right upper lobe of lung ([place]) 11/28/2013  . Psoriasis     DISCHARGE MEDICATIONS: Current Discharge Medication List    START taking these medications   Details  ipratropium-albuterol (DUONEB) 0.5-2.5 (3) MG/3ML SOLN Take 3 mLs by nebulization every 4 (four) hours as needed.      CONTINUE these medications which have CHANGED   Details  HYDROcodone-acetaminophen (NORCO/VICODIN) 5-325 MG tablet Take 1-2 tablets by mouth every 6 (six) hours as needed for moderate pain. Qty: 30 tablet, Refills: 0    insulin detemir (LEVEMIR) 100 UNIT/ML injection Inject 0.2 mLs (20 Units total) into the skin at  bedtime. Qty: 10 mL, Refills: 0      CONTINUE these medications which have NOT CHANGED   Details  budesonide-formoterol (SYMBICORT) 80-4.5 MCG/ACT inhaler Inhale 2 puffs into the lungs 2 (two) times daily.    fluticasone (CUTIVATE) 0.05 % cream Apply 1 application topically 2 (two) times daily as needed (rash).     gabapentin (NEURONTIN) 300 MG capsule Take 1 capsule (300 mg total) by mouth 2 (two) times daily. Qty: 30 capsule, Refills: 0    insulin aspart (NOVOLOG) 100 UNIT/ML injection Inject 0-15 Units into the skin 3 (three) times daily with meals. Sliding scale  CBG 70 - 120: 0 units: CBG 121 - 150: 2 units; CBG 151 - 200: 3 units; CBG 201 - 250: 5 units; CBG 251 - 300: 8 units;CBG 301 - 350: 11 units; CBG 351 - 400: 15 units; CBG > 400 : 15 units and notify MD Qty: 10 mL, Refills: 11    ketoconazole (NIZORAL) 2 % cream Apply 1 application topically 2 (two) times daily as needed for irritation.  Refills: 5    Mesalamine (ASACOL HD) 800 MG TBEC Take 3 tablets (2,400 mg total) by mouth 2 (two) times daily. Qty: 180 tablet, Refills: 2    saccharomyces boulardii (FLORASTOR) 250 MG capsule Take 1 capsule (250 mg total) by mouth 2 (two) times daily.    VESICARE 5 MG tablet Take 5 mg by mouth daily. Refills: 6    Vitamin D, Ergocalciferol, (DRISDOL) 50000 units CAPS capsule Take 1 capsule (  50,000 Units total) by mouth every 7 (seven) days. Qty: 30 capsule      STOP taking these medications     Fluticasone-Salmeterol (ADVAIR DISKUS) 100-50 MCG/DOSE AEPB         ALLERGIES:   Allergies  Allergen Reactions  . Carboplatin Other (See Comments)    Neuropathy  . Remicade [Infliximab] Anaphylaxis  . 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 Recent hospitalized from 4/10 - 4/14 after a fall with left distal fibula fracture status post surgical repair by Dr. Sharol Given and colitis. Presented 4/19 to ED from SNF after being found to have increased SOB. In ED, in acute respiratory distress and altered mental status. CXR revealed chronic interstitial disease with no acute findings however patient was found to be in DKA. PCCM admitted the patient to the intensive care unit.  CONSULTATIONS:   pulmonary/intensive care  PERTINENT RADIOLOGIC STUDIES: Ct Abdomen Pelvis Wo Contrast  02/04/2016  CLINICAL DATA:  Lower abdominal pain with gas and bloating, diarrhea for 4 days, bright red blood in stool, rectal bleeding, hypertension, rheumatoid arthritis, lung cancer post radiation therapy, diabetes mellitus, former smoker EXAM: CT ABDOMEN AND PELVIS WITHOUT CONTRAST TECHNIQUE: Multidetector CT imaging of the abdomen and pelvis was performed following the standard protocol without IV contrast. Sagittal and coronal MPR images reconstructed from axial data set. COMPARISON:  PET-CT 11/14/2013, chest CT 11/30/2015 FINDINGS: Soft tissue density adjacent to RIGHT heart border likely reflects RIGHT middle lobe atelectasis, unchanged. Calcified granuloma LEFT lower lobe image 6 with additional tiny LEFT lower lobe nodule image 9, stable. Calcified granulomata within spleen. 9 mm cyst LEFT lobe liver image 11 unchanged. Questionable 7 mm subcapsular lesion lateral RIGHT lobe liver image 34. Lesion anterior lateral segment LEFT lobe liver 19 x 13 mm image 19 previously 14 x 9 mm. Gallbladder surgically absent. Remainder of liver, spleen, pancreas, kidneys, and adrenal glands normal. Wall thickening of ascending colon through distal transverse colon consistent with colitis. Associated significant hyperemia of transverse mesocolon. Cecum and distal colon appear unaffected. Stomach and small bowel loops normal appearance.  Normal appendix Uterus surgically absent with nonvisualization of ovaries. No additional mass, adenopathy, free air free fluid. Bladder and ureters normal appearance. Scattered atherosclerotic calcifications. No acute osseous findings. IMPRESSION: Diffuse wall thickening of the ascending through distal transverse colon with associated hyperemia of the mesocolon compatible with colitis ; differential diagnosis includes infection and inflammatory bowel disease, ischemia less likely with this distribution though not excluded. Interval increase in size of a lesion at the lateral segment LEFT lobe liver question hepatic metastasis. Electronically Signed   By: Lavonia Dana M.D.   On: 02/04/2016 16:47   Dg Ankle Complete Left  02/01/2016  CLINICAL DATA:  75 year old female fell in kitchen. Ankle pain. Initial encounter. EXAM: LEFT ANKLE COMPLETE - 3+ VIEW COMPARISON:  07/22/2014. FINDINGS: Oblique fracture of distal left fibula with slight separation of fracture fragments. Slight irregularity of the cuboid. If there were tenderness in this region, left foot films recommended for further delineation. Plantar spur. IMPRESSION: Oblique fracture of distal left fibula with slight separation of fracture fragments. Slight irregularity of the cuboid. If there were tenderness in this region, left foot films recommended for further  delineation. Electronically Signed   By: Genia Del M.D.   On: 02/01/2016 11:39   Dg Ankle Complete Right  02/01/2016  CLINICAL DATA:  [place] in kitchen this morning, patient states she was light headed when she fell, left ankle pain worse than right. Hx right ankle surgery x 2 by dr. Sullivan Lone: RIGHT ANKLE - COMPLETE 3+ VIEW COMPARISON:  08/01/2015 FINDINGS: Plate and screw fixation device within the distal right fibula. No acute fracture, subluxation or dislocation. Soft tissues are intact. IMPRESSION: No acute bony abnormality. Electronically Signed   By: Rolm Baptise M.D.   On: 02/01/2016 11:36    Portable Chest X-ray (1 View)  02/18/2016  CLINICAL DATA:  Short of breath EXAM: PORTABLE CHEST 1 VIEW COMPARISON:  Radiograph 02/17/2016 FINDINGS: Patient is rotated rightward. There is collapse of the RIGHT middle lobe again demonstrated. Mediastinal shift to the RIGHT. No acute osseous findings. LEFT lung is clear. IMPRESSION: 1. No acute cardiopulmonary findings. 2. Chronic collapse the RIGHT middle lobe. Electronically Signed   By: Suzy Bouchard M.D.   On: 02/18/2016 07:17   Dg Chest Port 1 View  02/17/2016  CLINICAL DATA:  Dyspnea. History of hypertension. Previous right lung cancer. EXAM: PORTABLE CHEST 1 VIEW COMPARISON:  Chest 02/04/2016.  CT chest 11/30/2015 FINDINGS: Unchanged appearance of the chest with chronic volume loss in the right upper and middle lungs associated with mediastinal shift towards the right and hyperinflation of the left lung. No acute consolidation or airspace disease. No blunting of costophrenic angles. No pneumothorax. Heart size and pulmonary vascularity are normal. IMPRESSION: Chronic volume loss in the right middle and upper lungs. No evidence of active pulmonary disease. Electronically Signed   By: Lucienne Capers M.D.   On: 02/17/2016 05:52   Dg Chest Port 1 View  02/04/2016  CLINICAL DATA:  Cough, hypotension and diarrhea. History of squamous cell carcinoma of the lung. EXAM: PORTABLE CHEST 1 VIEW COMPARISON:  CT of the chest on 11/30/2015 FINDINGS: There is chronic volume loss of the right lung with dense chronic atelectasis and consolidation of the right middle lobe. No edema, infiltrate or pneumothorax identified. No pleural effusions are seen. The heart size is normal. IMPRESSION: Chronic right middle lobe atelectasis and consolidation. Electronically Signed   By: Aletta Edouard M.D.   On: 02/04/2016 22:02   Dg Foot Complete Left  02/01/2016  CLINICAL DATA:  75 year old female with ankle fracture. Foot pain. Post fall. Subsequent encounter. EXAM: LEFT  FOOT - COMPLETE 3+ VIEW COMPARISON:  02/01/2016 ankle films. FINDINGS: Fracture of the distal left fibula as noted on ankle films. No other fracture or dislocation noted. Plantar spur. IMPRESSION: Fracture of the distal left fibula as noted on ankle films. No other fracture or dislocation noted. Electronically Signed   By: Genia Del M.D.   On: 02/01/2016 13:12     PERTINENT LAB RESULTS: CBC:  Recent Labs  02/18/16 0845 02/20/16 0338  WBC 5.7 5.9  HGB 9.7* 10.0*  HCT 28.6* 31.2*  PLT 300 292   CMET CMP     Component Value Date/Time   NA 139 02/20/2016 0338   NA 136 12/01/2015 1104   K 3.3* 02/20/2016 0338   K 3.5 12/01/2015 1104   CL 103 02/20/2016 0338   CO2 27 02/20/2016 0338   CO2 22 12/01/2015 1104   GLUCOSE 160* 02/20/2016 0338   GLUCOSE 98 12/01/2015 1104   BUN <5* 02/20/2016 0338   BUN 13.6 12/01/2015 1104   CREATININE 0.68  02/20/2016 0338   CREATININE 0.9 12/01/2015 1104   CALCIUM 7.6* 02/20/2016 0338   CALCIUM 8.8 12/01/2015 1104   PROT 3.6* 02/18/2016 0845   PROT 5.7* 12/01/2015 1104   ALBUMIN 1.6* 02/18/2016 0845   ALBUMIN 2.8* 12/01/2015 1104   AST 14* 02/18/2016 0845   AST 10 12/01/2015 1104   ALT 11* 02/18/2016 0845   ALT 13 12/01/2015 1104   ALKPHOS 75 02/18/2016 0845   ALKPHOS 156* 12/01/2015 1104   BILITOT 1.1 02/18/2016 0845   BILITOT 0.43 12/01/2015 1104   GFRNONAA >60 02/20/2016 0338   GFRAA >60 02/20/2016 0338    GFR Estimated Creatinine Clearance: 63.1 mL/min (by C-G formula based on Cr of 0.68). No results for input(s): LIPASE, AMYLASE in the last 72 hours.  Recent Labs  02/17/16 1320 02/17/16 1957 02/18/16 0755  TROPONINI 0.04* 0.07* 0.06*   Invalid input(s): POCBNP No results for input(s): DDIMER in the last 72 hours. No results for input(s): HGBA1C in the last 72 hours. No results for input(s): CHOL, HDL, LDLCALC, TRIG, CHOLHDL, LDLDIRECT in the last 72 hours. No results for input(s): TSH, T4TOTAL, T3FREE, THYROIDAB in the  last 72 hours.  Invalid input(s): FREET3 No results for input(s): VITAMINB12, FOLATE, FERRITIN, TIBC, IRON, RETICCTPCT in the last 72 hours. Coags:  Recent Labs  02/18/16 0845  INR 1.11   Microbiology: Recent Results (from the past 240 hour(s))  Urine culture     Status: None   Collection Time: 02/17/16  7:12 AM  Result Value Ref Range Status   Specimen Description URINE, CATHETERIZED  Final   Special Requests ADDED [id number] [phone]  Final   Culture NO GROWTH 1 DAY  Final   Report Status 02/18/2016 FINAL  Final  Culture, blood (Routine X 2) w Reflex to ID Panel     Status: None (Preliminary result)   Collection Time: 02/17/16  9:50 AM  Result Value Ref Range Status   Specimen Description BLOOD RIGHT ANTECUBITAL  Final   Special Requests BOTTLES DRAWN AEROBIC AND ANAEROBIC 5CC  Final   Culture NO GROWTH 2 DAYS  Final   Report Status PENDING  Incomplete  Culture, blood (Routine X 2) w Reflex to ID Panel     Status: None (Preliminary result)   Collection Time: 02/17/16 10:06 AM  Result Value Ref Range Status   Specimen Description BLOOD LEFT HAND  Final   Special Requests IN PEDIATRIC BOTTLE 2CC  Final   Culture NO GROWTH 2 DAYS  Final   Report Status PENDING  Incomplete  MRSA PCR Screening     Status: None   Collection Time: 02/17/16 12:52 PM  Result Value Ref Range Status   MRSA by PCR NEGATIVE NEGATIVE Final    Comment:        The GeneXpert MRSA Assay (FDA approved for NASAL specimens only), is one component of a comprehensive MRSA colonization surveillance program. It is not intended to diagnose MRSA infection nor to guide or monitor treatment for MRSA infections.      BRIEF HOSPITAL COURSE:   Principal Problem: DKA (diabetic ketoacidoses): Resolved with insulin infusion, back on Levemir with SSI. CBGs well-controlled. Further optimization can be done while at [facility]. A1c 12.8.  Active Problems: Acute respiratory distress: Likely tachypnea secondary to severe  metabolic acidosis. Resolved. Lungs are completely clear on exam on discharge.  SIR's: Likely noninfectious, probably secondary to DKA. Blood/urine cultures continue to be negative. Chest x-ray without evidence of infection. Empirically treated with vancomycin and Zosyn. We will  discontinue all antibiotics on admission is no evidence of infection currently.  Acute renal failure: Likely prerenal azotemia in a setting of DKA. Resolved with correction of DKA.  Acute metabolic encephalopathy: Secondary to DKA, resolved. Completely awake and alert.  Hyponatremia: Resolved, likely secondary to hyperglycemia.  Hypokalemia and hypophosphatemia: Likely secondary to insulin use, being supplemented. Please recheck electrolytes in 1 week.  Minimal troponin elevation: Likely secondary to demand ischemia, suspect no further workup required while inpatient. Outpatient cardiology follow-up scheduled for next month.  H/O Stage III Squamous Cell CA with possible Mets to Liver : Currently on no chemotherapy-ensure follow-up with Dr. Julien Nordmann  Recent Fall with left distal fibula fracture: Underwent recent surgery by Dr. Sharol Given on 02/10/16. Please ensure follow-up with Dr. Sharol Given in 1 week. Orthopedics had recommended touch toe weightbearing to left leg with Cam boot  History of multiple episodes of syncope: Per prior chart review-this was likely secondary to hypotension/orthostatics-all antihypertensives have been discontinued. Outpatient follow-up with cardiology has been arranged.   TODAY-DAY OF DISCHARGE:  Subjective:   Megan [name] today has no headache,no chest abdominal pain,no new weakness tingling or numbness, feels much better wants to go home today.   Objective:   Blood pressure 116/75, pulse 88, temperature 98.1 F (36.7 C), temperature source Oral, resp. rate 18, weight 73 kg (160 lb 15 oz), SpO2 96 %.  Intake/Output Summary (Last 24 hours) at 02/20/16 0916 Last data filed at 02/20/16 0600   Gross per 24 hour  Intake    460 ml  Output   1600 ml  Net  -1140 ml   Filed Weights   02/18/16 2021  Weight: 73 kg (160 lb 15 oz)    Exam Awake Alert, Oriented *3, No new F.N deficits, Normal affect [place].AT,PERRAL Supple Neck,No JVD, No cervical lymphadenopathy appriciated.  Symmetrical Chest wall movement, Good air movement bilaterally, CTAB RRR,No Gallops,Rubs or new Murmurs, No Parasternal Heave +ve B.Sounds, Abd Soft, Non tender, No organomegaly appriciated, No rebound -guarding or rigidity. No Cyanosis, Clubbing or edema, No new Rash or bruise  DISCHARGE CONDITION: Stable  DISPOSITION: SNF  DISCHARGE INSTRUCTIONS:    Check CBGs before meals and at bedtime  Activity:  Patient may be touchdown weightbearing with her fracture boot in place. She does not need to wear the boot if she is laying in bed.  Get Medicines reviewed and adjusted: Please take all your medications with you for your next visit with your Primary MD  Please request your Primary MD to go over all hospital tests and procedure/radiological results at the follow up, please ask your Primary MD to get all Hospital records sent to his/her office.  If you experience worsening of your admission symptoms, develop shortness of breath, life threatening emergency, suicidal or homicidal thoughts you must seek medical attention immediately by calling 911 or calling your MD immediately  if symptoms less severe.  You must read complete instructions/literature along with all the possible adverse reactions/side effects for all the Medicines you take and that have been prescribed to you. Take any new Medicines after you have completely understood and accpet all the possible adverse reactions/side effects.   Do not drive when taking Pain medications.   Do not take more than prescribed Pain, Sleep and Anxiety Medications  Special Instructions: If you have smoked or chewed Tobacco  in the last 2 yrs please stop smoking, stop  any regular Alcohol  and or any Recreational drug use.  Wear Seat belts while driving.  Please note  You were cared for by a hospitalist during your hospital stay. Once you are discharged, your primary care physician will handle any further medical issues. Please note that NO REFILLS for any discharge medications will be authorized once you are discharged, as it is imperative that you return to your primary care physician (or establish a relationship with a primary care physician if you do not have one) for your aftercare needs so that they can reassess your need for medications and monitor your lab values.   Diet recommendation: Diabetic Diet Heart Healthy diet   Discharge Instructions    Call MD for:  redness, tenderness, or signs of infection (pain, swelling, redness, odor or green/yellow discharge around incision site)    Complete by:  As directed      Call MD for:  severe uncontrolled pain    Complete by:  As directed      Diet - low sodium heart healthy    Complete by:  As directed      Diet Carb Modified    Complete by:  As directed      Increase activity slowly    Complete by:  As directed   Patient may be touchdown weightbearing with her fracture boot in place. She does not need to wear the boot if she is laying in bed           Follow-up Information    Follow up with Eilleen Kempf., MD On 03/07/2016.   Specialty:  Oncology   Why:  Appt at 8:45   Contact information:   [place] [address] [phone]       Follow up with [name], MD. Schedule an appointment as soon as possible for a visit in 1 week.   Specialty:  Endocrinology   Contact information:   [address] [place] [address] [phone]       Follow up with DUDA,MARCUS V, MD. Schedule an appointment as soon as possible for a visit in 1 week.   Specialty:  Orthopedic Surgery   Contact information:   [place]  [address] [phone]       Follow up with Nelida Meuse III, MD On 03/22/2016.   Specialty:  Gastroenterology   Why:  appt at 9:45 am   Contact information:   [place] [address] [phone]       Follow up with Sanda Klein, MD. Schedule an appointment as soon as possible for a visit on 03/23/2016.   Specialty:  Cardiology   Why:  appt at 10:15 am   Contact information:   [address] [place] [address] [phone]       Follow up with [facility], MD On 03/10/2016.   Specialty:  Pulmonary Disease   Contact information:   [place] [address] [phone]      Total Time spent on discharge equals  45 minutes.  SignedOren Binet 02/20/2016 9:16 AM

## 2016-02-20 NOTE — NC FL2 (Signed)
[place] LEVEL OF CARE SCREENING TOOL     IDENTIFICATION  Patient Name: Megan [name] Birthdate: [birth year] Sex: female Admission Date (Current Location): 02/17/2016  [facility] and [place] Number:  Herbalist and Address:  [facility], [place] [address], [place], [place] [address]      Provider Number: [id number]  Attending Physician Name and Address:  Jonetta Osgood, MD  Relative Name and Phone Number:  Gladys Damme Significant other [phone]    Current Level of Care: Hospital Recommended Level of Care: [place] Prior Approval Number:    Date Approved/Denied:   PASRR Number: [id number]  Discharge Plan: SNF    Current Diagnoses: Patient Active Problem List   Diagnosis Date Noted  . DKA (diabetic ketoacidoses) ([place]) 02/17/2016  . Leukocytosis 02/17/2016  . Acute kidney injury (nontraumatic) ([place])   . Diabetic ketoacidosis with coma associated with diabetes mellitus due to underlying condition ([place])   . AKI (acute kidney injury) ([place]) 02/05/2016  . Abdominal pain   . Abnormal finding on GI tract imaging   . Diarrhea   . Hematochezia   . Acute kidney injury ([place]) 02/04/2016  . Acute colitis 02/04/2016  . DM type 2 (diabetes mellitus, type 2) ([place]) 02/04/2016  . RLS (restless legs syndrome) 12/11/2015  . Diarrhea due to drug 08/11/2015  . Liver dysfunction 08/11/2015  . Dysuria 07/07/2015  . Lumbago 05/06/2015  . Acute bronchitis 05/06/2015  . Hot flashes 04/14/2015  . Peripheral edema 04/14/2015  . Hypokalemia 04/14/2015  . Rheumatoid arthritis ([place]) 04/14/2015  . Encounter for antineoplastic immunotherapy 04/06/2015  . Emphysema of lung ([place]) 11/26/2014  . Hyperglycemia due to type 2 diabetes mellitus ([place]) 11/10/2014  . Radiation-induced esophageal stricture 10/01/2014  . Tachycardia 08/27/2014  . Depression 08/27/2014  . Palliative care encounter 08/21/2014  . DNR (do not  resuscitate) discussion 08/21/2014  . Neuropathic pain 08/21/2014  . Cancer associated pain 07/29/2014  . Malignant cachexia ([place]) 07/29/2014  . Acute renal insufficiency 07/29/2014  . Lung collapse 07/29/2014  . Chemotherapy-induced neuropathy ([place]) 07/29/2014  . Fatigue 07/18/2014  . Neuropathy due to chemotherapeutic drug ([place]) 07/18/2014  . Fever 07/18/2014  . Dehydration 07/18/2014  . Leg cramps 07/18/2014  . Hypoalbuminemia 07/18/2014  . Thrombocytopenia, unspecified ([place]) 07/18/2014  . Hypotension 07/18/2014  . Dyspnea 04/28/2014  . Radiation esophagitis 04/10/2014  . Right-sided chest wall pain 04/01/2014  . Cancer of middle lobe of lung ([place]) 01/17/2014  . Malignant neoplasm of right upper lobe of lung ([place]) 11/28/2013  . Lung mass 11/09/2013  . Need for prophylactic vaccination and inoculation against influenza 11/09/2013    Orientation RESPIRATION BLADDER Height & Weight     Self, Time, Situation, Place    Continent Weight: 160 lb 15 oz (73 kg) Height:     BEHAVIORAL SYMPTOMS/MOOD NEUROLOGICAL BOWEL NUTRITION STATUS      Continent    AMBULATORY STATUS COMMUNICATION OF NEEDS Skin   Limited Assist Verbally Surgical wounds                       Personal Care Assistance Level of Assistance  Bathing, Dressing Bathing Assistance: Limited assistance   Dressing Assistance: Limited assistance     Functional Limitations Info             SPECIAL CARE FACTORS FREQUENCY  PT (By licensed PT), OT (By licensed OT)     PT Frequency: 5/week OT Frequency: 5/week  Contractures Contractures Info: Not present    Additional Factors Info  Code Status Code Status Info: Full              Current Medications (02/20/2016):  This is the current hospital active medication list Current Facility-Administered Medications  Medication Dose Route Frequency Provider Last Rate Last Dose  . acetaminophen (TYLENOL) tablet 650 mg  650 mg Oral Q6H PRN Rondel Jumbo, PA-C       Or  . acetaminophen (TYLENOL) suppository 650 mg  650 mg Rectal Q6H PRN Rondel Jumbo, PA-C      . bisacodyl (DULCOLAX) suppository 10 mg  10 mg Rectal Daily PRN Rondel Jumbo, PA-C      . darifenacin (ENABLEX) 24 hr tablet 7.5 mg  7.5 mg Oral Daily Rondel Jumbo, PA-C   7.5 mg at 02/20/16 1015  . enoxaparin (LOVENOX) injection 40 mg  40 mg Subcutaneous Q24H Raylene Miyamoto, MD   40 mg at 02/19/16 1302  . gabapentin (NEURONTIN) capsule 300 mg  300 mg Oral BID Rondel Jumbo, PA-C   300 mg at 02/20/16 1015  . HYDROcodone-acetaminophen (NORCO/VICODIN) 5-325 MG per tablet 1-2 tablet  1-2 tablet Oral Q6H PRN Rondel Jumbo, PA-C   1 tablet at 02/19/16 2315  . insulin aspart (novoLOG) injection 0-15 Units  0-15 Units Subcutaneous TID WC Lucious Groves, DO   1 Units at 02/20/16 0815  . insulin aspart (novoLOG) injection 0-5 Units  0-5 Units Subcutaneous QHS Lucious Groves, DO   0 Units at 02/18/16 2200  . insulin glargine (LANTUS) injection 15 Units  15 Units Subcutaneous Daily Lucious Groves, DO   15 Units at 02/20/16 1014  . ipratropium-albuterol (DUONEB) 0.5-2.5 (3) MG/3ML nebulizer solution 3 mL  3 mL Nebulization Q4H PRN Rondel Jumbo, PA-C      . magnesium citrate solution 1 Bottle  1 Bottle Oral Once PRN Rondel Jumbo, PA-C      . magnesium sulfate IVPB 2 g 50 mL  2 g Intravenous Once Jonetta Osgood, MD   2 g at 02/20/16 1011  . mesalamine (LIALDA) EC tablet 2.4 g  2,400 mg Oral BID Rondel Jumbo, PA-C   2.4 g at 02/20/16 1016  . ondansetron (ZOFRAN) tablet 4 mg  4 mg Oral Q6H PRN Rondel Jumbo, PA-C       Or  . ondansetron [facility]) injection 4 mg  4 mg Intravenous Q6H PRN Rondel Jumbo, PA-C      . piperacillin-tazobactam (ZOSYN) IVPB 3.375 g  3.375 g Intravenous Q8H Rachel L Rumbarger, RPH   3.375 g at 02/20/16 0216  . saccharomyces boulardii (FLORASTOR) capsule 250 mg  250 mg Oral BID Rondel Jumbo, PA-C   250 mg at 02/20/16 1015  . senna-docusate  (Senokot-S) tablet 1 tablet  1 tablet Oral QHS PRN Rondel Jumbo, PA-C      . traZODone (DESYREL) tablet 25 mg  25 mg Oral QHS PRN Rondel Jumbo, PA-C       Facility-Administered Medications Ordered in Other Encounters  Medication Dose Route Frequency Provider Last Rate Last Dose  . sodium chloride 0.9 % injection 10 mL  10 mL Intracatheter PRN Curt Bears, MD   10 mL at 12/30/13 1121     Discharge Medications: Please see discharge summary for a list of discharge medications.  Relevant Imaging Results:  Relevant Lab Results:   Additional Information [id number]  Ross Ludwig, LCSWA

## 2016-02-22 LAB — CULTURE, BLOOD (ROUTINE X 2)
CULTURE: NO GROWTH
CULTURE: NO GROWTH

## 2016-02-24 ENCOUNTER — Telehealth: Payer: Self-pay | Admitting: Medical Oncology

## 2016-02-24 NOTE — Telephone Encounter (Signed)
I left a return call to nurse. I need to know who referred pt and for what diagnosis? If hospice referral for anything other than her cancer please call PCP for attending .

## 2016-02-25 ENCOUNTER — Telehealth: Payer: Self-pay | Admitting: Medical Oncology

## 2016-02-25 NOTE — Telephone Encounter (Signed)
Pt was referred to hospice when she was at [facility]. I told hospice RN that Julien Nordmann is not going to sign off as attending as he did not order it and pt has upcoming scan and  appts.

## 2016-02-26 ENCOUNTER — Inpatient Hospital Stay (HOSPITAL_COMMUNITY)
Admission: EM | Admit: 2016-02-26 | Discharge: 2016-03-02 | DRG: 638 | Disposition: A | Discharge status: Skilled Nursing Facility | Payer: Medicare Other | Attending: Internal Medicine | Admitting: Internal Medicine

## 2016-02-26 ENCOUNTER — Inpatient Hospital Stay (HOSPITAL_COMMUNITY): Payer: Medicare Other

## 2016-02-26 ENCOUNTER — Encounter (HOSPITAL_COMMUNITY): Payer: Self-pay

## 2016-02-26 DIAGNOSIS — I10 Essential (primary) hypertension: Secondary | ICD-10-CM | POA: Diagnosis present

## 2016-02-26 DIAGNOSIS — Z85118 Personal history of other malignant neoplasm of bronchus and lung: Secondary | ICD-10-CM | POA: Diagnosis not present

## 2016-02-26 DIAGNOSIS — E872 Acidosis, unspecified: Secondary | ICD-10-CM

## 2016-02-26 DIAGNOSIS — L89150 Pressure ulcer of sacral region, unstageable: Secondary | ICD-10-CM | POA: Diagnosis present

## 2016-02-26 DIAGNOSIS — E440 Moderate protein-calorie malnutrition: Secondary | ICD-10-CM | POA: Diagnosis present

## 2016-02-26 DIAGNOSIS — E1110 Type 2 diabetes mellitus with ketoacidosis without coma: Secondary | ICD-10-CM

## 2016-02-26 DIAGNOSIS — I951 Orthostatic hypotension: Secondary | ICD-10-CM | POA: Diagnosis present

## 2016-02-26 DIAGNOSIS — Z794 Long term (current) use of insulin: Secondary | ICD-10-CM | POA: Diagnosis not present

## 2016-02-26 DIAGNOSIS — J439 Emphysema, unspecified: Secondary | ICD-10-CM | POA: Diagnosis present

## 2016-02-26 DIAGNOSIS — N289 Disorder of kidney and ureter, unspecified: Secondary | ICD-10-CM

## 2016-02-26 DIAGNOSIS — C3411 Malignant neoplasm of upper lobe, right bronchus or lung: Secondary | ICD-10-CM | POA: Diagnosis present

## 2016-02-26 DIAGNOSIS — E1140 Type 2 diabetes mellitus with diabetic neuropathy, unspecified: Secondary | ICD-10-CM | POA: Diagnosis present

## 2016-02-26 DIAGNOSIS — D72829 Elevated white blood cell count, unspecified: Secondary | ICD-10-CM | POA: Diagnosis present

## 2016-02-26 DIAGNOSIS — L8990 Pressure ulcer of unspecified site, unspecified stage: Secondary | ICD-10-CM | POA: Insufficient documentation

## 2016-02-26 DIAGNOSIS — R197 Diarrhea, unspecified: Secondary | ICD-10-CM | POA: Diagnosis present

## 2016-02-26 DIAGNOSIS — E1310 Other specified diabetes mellitus with ketoacidosis without coma: Secondary | ICD-10-CM | POA: Diagnosis present

## 2016-02-26 DIAGNOSIS — E11649 Type 2 diabetes mellitus with hypoglycemia without coma: Secondary | ICD-10-CM | POA: Diagnosis present

## 2016-02-26 DIAGNOSIS — Z923 Personal history of irradiation: Secondary | ICD-10-CM

## 2016-02-26 DIAGNOSIS — M069 Rheumatoid arthritis, unspecified: Secondary | ICD-10-CM | POA: Diagnosis present

## 2016-02-26 DIAGNOSIS — Z87891 Personal history of nicotine dependence: Secondary | ICD-10-CM | POA: Diagnosis not present

## 2016-02-26 DIAGNOSIS — Z6825 Body mass index (BMI) 25.0-25.9, adult: Secondary | ICD-10-CM

## 2016-02-26 DIAGNOSIS — E0810 Diabetes mellitus due to underlying condition with ketoacidosis without coma: Secondary | ICD-10-CM

## 2016-02-26 DIAGNOSIS — G620 Drug-induced polyneuropathy: Secondary | ICD-10-CM | POA: Diagnosis present

## 2016-02-26 DIAGNOSIS — N179 Acute kidney failure, unspecified: Secondary | ICD-10-CM | POA: Diagnosis present

## 2016-02-26 DIAGNOSIS — E860 Dehydration: Secondary | ICD-10-CM | POA: Diagnosis present

## 2016-02-26 DIAGNOSIS — K51019 Ulcerative (chronic) pancolitis with unspecified complications: Secondary | ICD-10-CM | POA: Diagnosis not present

## 2016-02-26 DIAGNOSIS — R238 Other skin changes: Secondary | ICD-10-CM | POA: Diagnosis present

## 2016-02-26 DIAGNOSIS — S82892A Other fracture of left lower leg, initial encounter for closed fracture: Secondary | ICD-10-CM

## 2016-02-26 DIAGNOSIS — T451X5A Adverse effect of antineoplastic and immunosuppressive drugs, initial encounter: Secondary | ICD-10-CM | POA: Diagnosis present

## 2016-02-26 DIAGNOSIS — K5190 Ulcerative colitis, unspecified, without complications: Secondary | ICD-10-CM | POA: Diagnosis present

## 2016-02-26 DIAGNOSIS — L909 Atrophic disorder of skin, unspecified: Secondary | ICD-10-CM

## 2016-02-26 DIAGNOSIS — C787 Secondary malignant neoplasm of liver and intrahepatic bile duct: Secondary | ICD-10-CM | POA: Diagnosis present

## 2016-02-26 DIAGNOSIS — E871 Hypo-osmolality and hyponatremia: Secondary | ICD-10-CM | POA: Diagnosis present

## 2016-02-26 DIAGNOSIS — R739 Hyperglycemia, unspecified: Secondary | ICD-10-CM | POA: Diagnosis present

## 2016-02-26 LAB — I-STAT VENOUS BLOOD GAS, ED
ACID-BASE DEFICIT: 17 mmol/L — AB (ref 0.0–2.0)
BICARBONATE: 9.7 meq/L — AB (ref 20.0–24.0)
O2 SAT: 35 %
PO2 VEN: 26 mmHg — AB (ref 31.0–45.0)
TCO2: 11 mmol/L (ref 0–100)
pCO2, Ven: 27 mmHg — ABNORMAL LOW (ref 45.0–50.0)
pH, Ven: 7.164 — CL (ref 7.250–7.300)

## 2016-02-26 LAB — URINE MICROSCOPIC-ADD ON: Bacteria, UA: NONE SEEN

## 2016-02-26 LAB — CBC WITH DIFFERENTIAL/PLATELET
BASOS PCT: 2 %
Basophils Absolute: 0.4 10*3/uL — ABNORMAL HIGH (ref 0.0–0.1)
EOS PCT: 0 %
Eosinophils Absolute: 0 10*3/uL (ref 0.0–0.7)
HCT: 48.5 % — ABNORMAL HIGH (ref 36.0–46.0)
HEMOGLOBIN: 16.3 g/dL — AB (ref 12.0–15.0)
LYMPHS PCT: 6 %
Lymphs Abs: 1.2 10*3/uL (ref 0.7–4.0)
MCH: 29.9 pg (ref 26.0–34.0)
MCHC: 33.6 g/dL (ref 30.0–36.0)
MCV: 88.8 fL (ref 78.0–100.0)
MONOS PCT: 5 %
Monocytes Absolute: 1 10*3/uL (ref 0.1–1.0)
Neutro Abs: 17.5 10*3/uL — ABNORMAL HIGH (ref 1.7–7.7)
Neutrophils Relative %: 87 %
Platelets: 355 10*3/uL (ref 150–400)
RBC: 5.46 MIL/uL — ABNORMAL HIGH (ref 3.87–5.11)
RDW: 15.1 % (ref 11.5–15.5)
WBC MORPHOLOGY: INCREASED
WBC: 20.1 10*3/uL — ABNORMAL HIGH (ref 4.0–10.5)

## 2016-02-26 LAB — BASIC METABOLIC PANEL
Anion gap: 22 — ABNORMAL HIGH (ref 5–15)
Anion gap: 23 — ABNORMAL HIGH (ref 5–15)
Anion gap: 10 (ref 5–15)
Anion gap: 10 (ref 5–15)
BUN: 34 mg/dL — ABNORMAL HIGH (ref 6–20)
BUN: 33 mg/dL — AB (ref 6–20)
BUN: 30 mg/dL — AB (ref 6–20)
BUN: 28 mg/dL — AB (ref 6–20)
CHLORIDE: 94 mmol/L — AB (ref 101–111)
CHLORIDE: 94 mmol/L — AB (ref 101–111)
CHLORIDE: 102 mmol/L (ref 101–111)
CHLORIDE: 103 mmol/L (ref 101–111)
CO2: 10 mmol/L — AB (ref 22–32)
CO2: 10 mmol/L — AB (ref 22–32)
CO2: 18 mmol/L — AB (ref 22–32)
CO2: 18 mmol/L — AB (ref 22–32)
CREATININE: 2.18 mg/dL — AB (ref 0.44–1.00)
CREATININE: 1.39 mg/dL — AB (ref 0.44–1.00)
CREATININE: 1.24 mg/dL — AB (ref 0.44–1.00)
Calcium: 7.9 mg/dL — ABNORMAL LOW (ref 8.9–10.3)
Calcium: 7.8 mg/dL — ABNORMAL LOW (ref 8.9–10.3)
Calcium: 7.5 mg/dL — ABNORMAL LOW (ref 8.9–10.3)
Calcium: 7.7 mg/dL — ABNORMAL LOW (ref 8.9–10.3)
Creatinine, Ser: 2.15 mg/dL — ABNORMAL HIGH (ref 0.44–1.00)
GFR calc Af Amer: 24 mL/min — ABNORMAL LOW (ref >60)
GFR calc Af Amer: 42 mL/min — ABNORMAL LOW (ref >60)
GFR calc Af Amer: 48 mL/min — ABNORMAL LOW (ref >60)
GFR calc non Af Amer: 21 mL/min — ABNORMAL LOW (ref >60)
GFR calc non Af Amer: 21 mL/min — ABNORMAL LOW (ref >60)
GFR calc non Af Amer: 36 mL/min — ABNORMAL LOW (ref >60)
GFR calc non Af Amer: 42 mL/min — ABNORMAL LOW (ref >60)
GFR, EST AFRICAN AMERICAN: 25 mL/min — AB (ref >60)
GLUCOSE: 490 mg/dL — AB (ref 65–99)
Glucose, Bld: 520 mg/dL — ABNORMAL HIGH (ref 65–99)
Glucose, Bld: 202 mg/dL — ABNORMAL HIGH (ref 65–99)
Glucose, Bld: 128 mg/dL — ABNORMAL HIGH (ref 65–99)
POTASSIUM: 4.4 mmol/L (ref 3.5–5.1)
POTASSIUM: 4.5 mmol/L (ref 3.5–5.1)
Potassium: 5.2 mmol/L — ABNORMAL HIGH (ref 3.5–5.1)
Potassium: 4.8 mmol/L (ref 3.5–5.1)
Sodium: 126 mmol/L — ABNORMAL LOW (ref 135–145)
Sodium: 127 mmol/L — ABNORMAL LOW (ref 135–145)
Sodium: 130 mmol/L — ABNORMAL LOW (ref 135–145)
Sodium: 131 mmol/L — ABNORMAL LOW (ref 135–145)

## 2016-02-26 LAB — URINALYSIS, ROUTINE W REFLEX MICROSCOPIC
HGB URINE DIPSTICK: NEGATIVE
Ketones, ur: 40 mg/dL — AB
LEUKOCYTES UA: NEGATIVE
Nitrite: NEGATIVE
Protein, ur: NEGATIVE mg/dL
SPECIFIC GRAVITY, URINE: 1.026 (ref 1.005–1.030)
pH: 5 (ref 5.0–8.0)

## 2016-02-26 LAB — HEPATIC FUNCTION PANEL
ALBUMIN: 2.4 g/dL — AB (ref 3.5–5.0)
ALK PHOS: 113 U/L (ref 38–126)
ALT: 8 U/L — AB (ref 14–54)
AST: 15 U/L (ref 15–41)
Bilirubin, Direct: 0.2 mg/dL (ref 0.1–0.5)
Indirect Bilirubin: 1.2 mg/dL — ABNORMAL HIGH (ref 0.3–0.9)
TOTAL PROTEIN: 4.7 g/dL — AB (ref 6.5–8.1)
Total Bilirubin: 1.4 mg/dL — ABNORMAL HIGH (ref 0.3–1.2)

## 2016-02-26 LAB — CBG MONITORING, ED
GLUCOSE-CAPILLARY: 220 mg/dL — AB (ref 65–99)
Glucose-Capillary: 485 mg/dL — ABNORMAL HIGH (ref 65–99)
Glucose-Capillary: 312 mg/dL — ABNORMAL HIGH (ref 65–99)
Glucose-Capillary: 198 mg/dL — ABNORMAL HIGH (ref 65–99)
Glucose-Capillary: 187 mg/dL — ABNORMAL HIGH (ref 65–99)
Glucose-Capillary: 154 mg/dL — ABNORMAL HIGH (ref 65–99)

## 2016-02-26 LAB — C DIFFICILE QUICK SCREEN W PCR REFLEX
C DIFFICILE (CDIFF) INTERP: NEGATIVE
C DIFFICILE (CDIFF) TOXIN: NEGATIVE
C Diff antigen: NEGATIVE

## 2016-02-26 LAB — MRSA PCR SCREENING: MRSA by PCR: NEGATIVE

## 2016-02-26 LAB — MAGNESIUM: MAGNESIUM: 1.7 mg/dL (ref 1.7–2.4)

## 2016-02-26 LAB — PHOSPHORUS: PHOSPHORUS: 3.6 mg/dL (ref 2.5–4.6)

## 2016-02-26 LAB — LIPASE, BLOOD: Lipase: 18 U/L (ref 11–51)

## 2016-02-26 MED ORDER — IPRATROPIUM-ALBUTEROL 0.5-2.5 (3) MG/3ML IN SOLN: 3.0000 mL | RESPIRATORY_TRACT | Status: DC | PRN

## 2016-02-26 MED ORDER — SODIUM CHLORIDE 0.9 % IV BOLUS (SEPSIS)
1000.0000 mL | Freq: Once | INTRAVENOUS | Status: AC
  Administered 2016-02-26: 1000 mL via INTRAVENOUS

## 2016-02-26 MED ORDER — MESALAMINE 1.2 G PO TBEC
2400.0000 mg | DELAYED_RELEASE_TABLET | Freq: Two times a day (BID) | ORAL | Status: DC
  Administered 2016-02-26: 2.4 g via ORAL
  Filled 2016-02-26 (×11): qty 2

## 2016-02-26 MED ORDER — ONDANSETRON HCL 4 MG/2ML IJ SOLN
INTRAMUSCULAR | Status: AC
  Administered 2016-02-26: 4 mg
  Filled 2016-02-26: qty 2

## 2016-02-26 MED ORDER — SODIUM CHLORIDE 0.9 % IV SOLN
INTRAVENOUS | Status: DC
  Administered 2016-02-26: 13:00:00 via INTRAVENOUS

## 2016-02-26 MED ORDER — DEXTROSE-NACL 5-0.45 % IV SOLN
INTRAVENOUS | Status: AC
  Administered 2016-02-26: 15:00:00 via INTRAVENOUS

## 2016-02-26 MED ORDER — ZINC OXIDE 11.3 % EX CREA
TOPICAL_CREAM | Freq: Two times a day (BID) | CUTANEOUS | Status: DC
  Administered 2016-02-26 – 2016-02-27 (×3): 1 via TOPICAL
  Administered 2016-02-27 – 2016-02-28 (×2): via TOPICAL
  Administered 2016-02-28: 1 via TOPICAL
  Administered 2016-02-29 – 2016-03-02 (×5): via TOPICAL
  Filled 2016-02-26: qty 56

## 2016-02-26 MED ORDER — ALUM & MAG HYDROXIDE-SIMETH 200-200-20 MG/5ML PO SUSP
30.0000 mL | Freq: Once | ORAL | Status: AC
  Administered 2016-02-26: 30 mL via ORAL
  Filled 2016-02-26: qty 30

## 2016-02-26 MED ORDER — GABAPENTIN 300 MG PO CAPS
300.0000 mg | ORAL_CAPSULE | Freq: Two times a day (BID) | ORAL | Status: DC
  Administered 2016-02-26 – 2016-03-02 (×10): 300 mg via ORAL
  Filled 2016-02-26 (×10): qty 1

## 2016-02-26 MED ORDER — MOMETASONE FURO-FORMOTEROL FUM 100-5 MCG/ACT IN AERO
2.0000 | INHALATION_SPRAY | Freq: Two times a day (BID) | RESPIRATORY_TRACT | Status: DC
  Administered 2016-02-27 – 2016-03-02 (×8): 2 via RESPIRATORY_TRACT
  Filled 2016-02-26 (×2): qty 8.8

## 2016-02-26 MED ORDER — SODIUM CHLORIDE 0.9 % IV SOLN
INTRAVENOUS | Status: AC
  Administered 2016-02-26: 2.8 [IU]/h via INTRAVENOUS
  Filled 2016-02-26: qty 2.5

## 2016-02-26 MED ORDER — DEXTROSE-NACL 5-0.45 % IV SOLN: INTRAVENOUS | Status: DC

## 2016-02-26 MED ORDER — ENOXAPARIN SODIUM 30 MG/0.3ML ~~LOC~~ SOLN
30.0000 mg | SUBCUTANEOUS | Status: DC
  Administered 2016-02-26 – 2016-03-01 (×5): 30 mg via SUBCUTANEOUS
  Filled 2016-02-26 (×6): qty 0.3

## 2016-02-26 MED ORDER — COLLAGENASE 250 UNIT/GM EX OINT
TOPICAL_OINTMENT | Freq: Every day | CUTANEOUS | Status: DC
  Administered 2016-02-26: 1 via TOPICAL
  Administered 2016-02-27 – 2016-03-02 (×5): via TOPICAL
  Filled 2016-02-26: qty 30

## 2016-02-26 MED ORDER — DARIFENACIN HYDROBROMIDE ER 7.5 MG PO TB24
7.5000 mg | ORAL_TABLET | Freq: Every day | ORAL | Status: DC
  Administered 2016-02-27 – 2016-03-02 (×5): 7.5 mg via ORAL
  Filled 2016-02-26 (×6): qty 1

## 2016-02-26 MED ORDER — GABAPENTIN 300 MG PO CAPS: 300.0000 mg | ORAL_CAPSULE | Freq: Two times a day (BID) | ORAL | Status: DC

## 2016-02-26 MED ORDER — SODIUM CHLORIDE 0.9 % IV SOLN
INTRAVENOUS | Status: DC
  Administered 2016-02-26: 2.5 [IU]/h via INTRAVENOUS
  Filled 2016-02-26: qty 2.5

## 2016-02-26 NOTE — H&P (Signed)
[facility] History and Physical  Megan [name] PIR:[id number] DOB: 1941/02/25 DOA: 02/26/2016  Referring physician: Regenia Skeeter PCP: [name], MD   Chief Complaint: hyperglycemia  HPI: Megan [name] is a 75 y.o. female with a past medical history of malignant neoplasm right upper lobe, diabetes, diabetic ketoacidosis with coma, attention persistent diarrhea since emergency department from home with the chief complaint of hyperglycemia. Initial evaluation in the emergency room reveals DKA.  Information is obtained from the patient and the chart. Patient discharged from rehabilitation facility 2 days ago was at home yesterday. States not eating but has been taken her insulin. Date she "gags" on medications and food. Said symptoms include nausea and persistent diarrhea. He reports abdomen tender to the touch but denies any abdominal pain. She states her diarrhea is at baseline. States she has one loose stool "every hour on the hour". She denies bright red blood per rectum melena dysuria hematuria. She denies fever chills cough shortness of breath. Was in the hospital prior to rehabilitation facility with DKA home health nurse first visit this morning noted her glucose was greater than 500. He denies chest pain palpitation headache dizziness syncope or near-syncope.  In the emergency department she is afebrile hemodynamically stable and not hypoxic. She is provided with 2 L of normal saline and insulin drip is initiated  Review of Systems:  10 point review of systems complete and all systems are negative except as indicated in the history of present illness   Past Medical History  Diagnosis Date  . Hypertension   . Rheumatoid arthritis ([place])   . GERD (gastroesophageal reflux disease)     no meds for  . Hx of radiation therapy 12/16/13-01/30/14    lung 66Gy  . Neuropathy ([place])   . Shortness of breath dyspnea     with exertion  . History of hiatal hernia   . Diabetes  mellitus     insulin  . Cancer ([place]) dx'd 09/2013    Lung ca  . Malignant neoplasm of right upper lobe of lung ([place]) 11/28/2013  . Psoriasis    Past Surgical History  Procedure Laterality Date  . Cholecystectomy    . Abdominal hysterectomy    . Dilation and curettage of uterus    . Abdominal surgery    . Tonsillectomy    . Esophagogastroduodenoscopy (egd) with propofol N/A 10/09/2014    Procedure: ESOPHAGOGASTRODUODENOSCOPY (EGD) WITH PROPOFOL;  Surgeon: Inda Castle, MD;  Location: [facility];  Service: Endoscopy;  Laterality: N/A;  . Savory dilation N/A 10/09/2014    Procedure: SAVORY DILATION;  Surgeon: Inda Castle, MD;  Location: [facility];  Service: Endoscopy;  Laterality: N/A;  . Esophagogastroduodenoscopy N/A 12/12/2014    Procedure: ESOPHAGOGASTRODUODENOSCOPY (EGD);  Surgeon: Inda Castle, MD;  [facility];  Service: Endoscopy;  Laterality: N/A;  with dilation  . Balloon dilation N/A 12/12/2014    Procedure: BALLOON DILATION;  Surgeon: Inda Castle, MD;  Location: [facility];  Service: Endoscopy;  Laterality: N/A;  . Orif ankle fracture Right 07/04/2015    Procedure: OPEN REDUCTION INTERNAL FIXATION (ORIF) ANKLE FRACTURE;  Surgeon: Newt Minion, MD;  Location: [facility];  Service: Orthopedics;  Laterality: Right;  OPEN REDUCTION, INTERNAL FIXATION OF RIGHT ANKLE FRACTURE.   . Fracture surgery    . Colonoscopy N/A 02/07/2016    Procedure: COLONOSCOPY;  Surgeon: Doran Stabler, MD;  Location: [facility] ENDOSCOPY;  Service: Endoscopy;  Laterality: N/A;  . Orif ankle fracture Left 02/10/2016  Procedure: OPEN REDUCTION INTERNAL FIXATION (ORIF) ANKLE FRACTURE;  Surgeon: Newt Minion, MD;  Location: [facility];  Service: Orthopedics;  Laterality: Left;   Social History:  reports that she quit smoking about 18 years ago. Her smoking use included Cigarettes. She has a 135 pack-year smoking history. She has never used smokeless tobacco. She reports that she does not drink  alcohol or use illicit drugs. Lives at home with a companion she has home health nurse just recently discharged from rehabilitation facility. Will assist with ADLs Allergies  Allergen Reactions  . Carboplatin Other (See Comments)    Neuropathy  . Remicade [Infliximab] Anaphylaxis  . Hydromorphone Rash    Family History  Problem Relation Age of Onset  . Hypertension Other   . Stroke Other   . Heart attack Other   . Hemachromatosis Other   . Rheum arthritis      both sets of grandparents and father  . Other Mother   . Other Father     failure to thrive     Prior to Admission medications   Medication Sig Start Date End Date Taking? Authorizing Provider  acetaminophen (TYLENOL) 325 MG tablet Take 650 mg by mouth every 6 (six) hours as needed for mild pain.   Yes Historical Provider, MD  budesonide-formoterol (SYMBICORT) 80-4.5 MCG/ACT inhaler Inhale 2 puffs into the lungs 2 (two) times daily.   Yes Historical Provider, MD  diphenoxylate-atropine (LOMOTIL) 2.5-0.025 MG tablet Take 1 tablet by mouth 4 (four) times daily as needed for diarrhea or loose stools.   Yes Historical Provider, MD  fluticasone (CUTIVATE) 0.05 % cream Apply 1 application topically 2 (two) times daily as needed (rash).    Yes Historical Provider, MD  gabapentin (NEURONTIN) 300 MG capsule Take 1 capsule (300 mg total) by mouth 2 (two) times daily. 02/12/16  Yes Ripudeep Krystal Eaton, MD  HYDROcodone-acetaminophen (NORCO/VICODIN) 5-325 MG tablet Take 1-2 tablets by mouth every 6 (six) hours as needed for moderate pain. 02/20/16  Yes Shanker Kristeen Mans, MD  insulin aspart (NOVOLOG) 100 UNIT/ML injection Inject 0-15 Units into the skin 3 (three) times daily with meals. Sliding scale  CBG 70 - 120: 0 units: CBG 121 - 150: 2 units; CBG 151 - 200: 3 units; CBG 201 - 250: 5 units; CBG 251 - 300: 8 units;CBG 301 - 350: 11 units; CBG 351 - 400: 15 units; CBG > 400 : 15 units and notify MD 02/12/16  Yes Ripudeep K Rai, MD  insulin detemir  (LEVEMIR) 100 UNIT/ML injection Inject 0.2 mLs (20 Units total) into the skin at bedtime. Patient taking differently: Inject 24 Units into the skin at bedtime.  02/20/16  Yes Shanker Kristeen Mans, MD  ipratropium-albuterol (DUONEB) 0.5-2.5 (3) MG/3ML SOLN Take 3 mLs by nebulization every 4 (four) hours as needed. 02/20/16  Yes Shanker Kristeen Mans, MD  ketoconazole (NIZORAL) 2 % cream Apply 1 application topically 2 (two) times daily as needed for irritation.  01/04/16  Yes Historical Provider, MD  Mesalamine (ASACOL HD) 800 MG TBEC Take 3 tablets (2,400 mg total) by mouth 2 (two) times daily. 02/10/16  Yes Nelida Meuse III, MD  saccharomyces boulardii (FLORASTOR) 250 MG capsule Take 1 capsule (250 mg total) by mouth 2 (two) times daily. 02/12/16  Yes Ripudeep Krystal Eaton, MD  VESICARE 5 MG tablet Take 5 mg by mouth daily. 01/09/16  Yes Historical Provider, MD  Vitamin D, Ergocalciferol, (DRISDOL) 50000 units CAPS capsule Take 1 capsule (50,000 Units total) by  mouth every 7 (seven) days. 02/12/16  Yes Ripudeep Krystal Eaton, MD   Physical Exam: Filed Vitals:   02/26/16 0945 02/26/16 1044 02/26/16 1118 02/26/16 1145  BP: 141/85 119/40 129/59 110/55  Pulse: 126 111 105 103  Temp: 97.5 F (36.4 C)  97.7 F (36.5 C)   TempSrc: Oral  Rectal   Resp: 32 18 16   SpO2: 99% 100% 100% 100%    Wt Readings from Last 3 Encounters:  02/18/16 73 kg (160 lb 15 oz)  02/04/16 70.353 kg (155 lb 1.6 oz)  02/04/16 63.957 kg (141 lb)    General:  Appears Slightly lethargic somewhat ill but not toxic and comfortable Eyes: PERRL, normal lids, irises & conjunctiva ENT: grossly normal hearing, lips & tongue is membranes of her mouth are pale dry Neck: no LAD, masses or thyromegaly Cardiovascular: RRR, no m/r/g. No LE edema.   Respiratory: CTA bilaterally, no w/r/r. Normal respiratory effort. Abdomen: soft, ntnd positive bowel sounds throughout no guarding Skin: Red rash echo area extending to low back and forward to perinuem. No open  areas no drainage no odor Musculoskeletal: grossly normal tone BUE/BLE Psychiatric: grossly normal mood and affect, speech fluent and appropriate Neurologic: grossly non-focal. Speech clear facial symmetry moves all extremities spontaneously           Labs on Admission:  Basic Metabolic Panel:  Recent Labs Lab 02/20/16 0338 02/26/16 1036  NA 139 126*  K 3.3* 5.2*  CL 103 94*  CO2 27 10*  GLUCOSE 160* 520*  BUN <5* 34*  CREATININE 0.68 2.15*  CALCIUM 7.6* 7.9*  MG 1.9  --   PHOS 2.3*  --    Liver Function Tests: No results for input(s): AST, ALT, ALKPHOS, BILITOT, PROT, ALBUMIN in the last 168 hours. No results for input(s): LIPASE, AMYLASE in the last 168 hours. No results for input(s): AMMONIA in the last 168 hours. CBC:  Recent Labs Lab 02/20/16 0338 02/26/16 1036  WBC 5.9 20.1*  NEUTROABS  --  17.5*  HGB 10.0* 16.3*  HCT 31.2* 48.5*  MCV 90.2 88.8  PLT 292 355   Cardiac Enzymes: No results for input(s): CKTOTAL, CKMB, CKMBINDEX, TROPONINI in the last 168 hours.  BNP (last 3 results) No results for input(s): BNP in the last 8760 hours.  ProBNP (last 3 results) No results for input(s): PROBNP in the last 8760 hours.  CBG:  Recent Labs Lab 02/19/16 1635 02/19/16 2116 02/20/16 0741 02/20/16 1204 02/26/16 0955  GLUCAP 187* 176* 131* 143* 485*    Radiological Exams on Admission: No results found.  EKG:  Assessment/Plan Principal Problem:   DKA (diabetic ketoacidoses) (HCC) Active Problems:   Malignant neoplasm of right upper lobe of lung (HCC)   Dehydration   Acute renal insufficiency   DM type 2 (diabetes mellitus, type 2) (HCC)   Diarrhea   Leukocytosis   Skin breakdown   Hyponatremia   #1 DKA. Etiology uncertain. Patient reports decreased oral intake compliance with insulin.Of note recent admission for same No change in chronic diarrhea. She does have a leukocytosis. Anion gap 20. Venous blood gas with ph 7.1 bicarb 9.7.Home  medications include Levemir 20 units daily at bedtime with sliding scale -Admit to step down -Vigorous IV fluids -Insulin drip and glucomander protocol -BMET every 4 hours x5.  -Transition to D5 normal saline when glucose less than 250 per protocol -Resume long-acting and sliding scale and gap closed  #2. Acute renal insufficiency. Likely related to #1. Creatinine 2.18 on  admission. Chart review indicates creatinine within the limits of normal 2 weeks ago. -Vigorous IV fluids -Hold nephrotoxins -Monitor urine output -Recheck in the morning  #3. Leukocytosis. WBC 20.1 on admission. May be reactive. Rectal temp 97.7. Hemodynamically stable and not hypoxic. On unremarkable. Chest x-ray unremarkable.  -hold off on antibiotics for now -Recheck in the morning  4. Chronic diarrhea/ recent colitis. Patient reports no change in baseline frequency or amount. -gi pathogen panel -C. difficile PCR -Resume home Imodium once clear  #5. Stage III squama cell lung cancer. Middle lobe. Not currently on therapy. Chart review indicates possible metastases to liver. Appears stable at baseline -Continue home inhalers -Oxygen supplementation as indicated  #6. Chemotherapy-induced neuropathy, orthostatic hypotension and emphysema. Appears stable at baseline. -Continue home meds  #7. Left distal fibula fracture status post surgical repair by Dr. Sharol Given 3 weeks ago. -Scheduled for outpatient follow-up 2 days -physical therapy  #8. Stage I skin breakdown low back and sacral area -Wound consult    Code Status: full DVT Prophylaxis: Family Communication: none present Disposition Plan: may need snf. Of note she has no family. A friend named [name] stays with her on occasion. Home health initiated after last hospitalization. His had palliative consult but declined hospice. In January of this year ECOG 2  Time spent: 27 minutes  [facility]

## 2016-02-26 NOTE — ED Notes (Signed)
Pt had bowel movement in brief. Cleaned and new brief placed by this RN. Pressure ulcer pad placed to sacral area where rash is after medication ointment administered.

## 2016-02-26 NOTE — ED Notes (Signed)
Pt complaining of nausea and dry heaving over emesis bag.

## 2016-02-26 NOTE — Telephone Encounter (Signed)
-----   Message from Curt Bears, MD sent at 02/25/2016  5:52 PM EDT ----- Regarding: RE: palliative care referral OK for palliative care referral. ----- Message -----    From: Ardeen Garland, RN    Sent: 02/25/2016  12:33 PM      To: Curt Bears, MD Subject: palliative care referral                       I told Hospice that you will not be attending for hospice referral as it is unclear what pt referred for or who referred her.  Now Hospice calling to see if you would order a palliative care referral? Pt has f/u may 5th with scans

## 2016-02-26 NOTE — ED Notes (Signed)
Assisted Juli, RN with obtaining rectal temperature and in and out cath; patient readjusted on stretcher and visitor at bedside

## 2016-02-26 NOTE — Consult Note (Addendum)
WOC wound consult note Reason for Consult: Consult requested for sacrum/buttocks Wound type: Bilat buttocks affected are 20X12cm, and inner groin are red and macerated with patchy areas of partial thickness skin loss; appearance consistent with moisture associated skin damage related to incontinence Sacrum area in inner gluteal fold with unstageable pressure injury; 2X.1cm, 100% yellow slough, painful to touch, no odor, scant amt yellow drainage. Pressure Ulcer POA: Yes Dressing procedure/placement/frequency: Air mattress to reduce pressure and increase airflow to affected areas, barrier cream to protect and repel moisture to buttocks and groin, Santyl ointment to provide enzymatic debridement of nonviable tissue to sacrum/inner gluteal fold. Please re-consult if further assistance is needed.  Thank-you,  Julien Girt MSN, [place], [place], [place], [place]

## 2016-02-26 NOTE — ED Notes (Signed)
Pt presents from home with report of CBG: 570, pt given 20 units of novolog with repeat: 498.  Pt has had no appetite.

## 2016-02-26 NOTE — Telephone Encounter (Signed)
I left a message at hospice for palliative care referral.

## 2016-02-26 NOTE — ED Notes (Signed)
Ice chips given

## 2016-02-26 NOTE — ED Notes (Signed)
POCT CBG resulted 312; Almyra Free, RN present in room

## 2016-02-26 NOTE — ED Provider Notes (Signed)
CSN: [id number]     Arrival date & time 02/26/16  8921 History   First MD Initiated Contact with Patient 02/26/16 1014     Chief Complaint  Patient presents with  . Hyperglycemia     (Consider location/radiation/quality/duration/timing/severity/associated sxs/prior Treatment) HPI  75 year old female brought in by home health aide for multiple issues. The patient herself is very vague on when her symptoms have started. Apparently patient has been having diarrhea for "a while" was diagnosed with colitis earlier this month. She was recently discharged from the hospital for DKA and sent to a rehabilitation facility and then went home yesterday. Home health nurse checked on her this morning and noticed her glucose was over 500 and she was complaining of diarrhea. She also has a rash to her buttocks. The patient states the rash has been there for a couple weeks. She is having chronic diarrhea, had blood but that was over 1 week ago. Is also nauseated and "gagging" whenever trying to eat. However she denies that she's vomiting. She denies abdominal pain. Chest pain. Endorses dysuria but cannot tell for how long.   Past Medical History  Diagnosis Date  . Hypertension   . Rheumatoid arthritis ([place])   . GERD (gastroesophageal reflux disease)     no meds for  . Hx of radiation therapy 12/16/13-01/30/14    lung 66Gy  . Neuropathy ([place])   . Shortness of breath dyspnea     with exertion  . History of hiatal hernia   . Diabetes mellitus     insulin  . Cancer ([place]) dx'd 09/2013    Lung ca  . Malignant neoplasm of right upper lobe of lung ([place]) 11/28/2013  . Psoriasis    Past Surgical History  Procedure Laterality Date  . Cholecystectomy    . Abdominal hysterectomy    . Dilation and curettage of uterus    . Abdominal surgery    . Tonsillectomy    . Esophagogastroduodenoscopy (egd) with propofol N/A 10/09/2014    Procedure: ESOPHAGOGASTRODUODENOSCOPY (EGD) WITH PROPOFOL;  Surgeon: Inda Castle,  MD;  Location: [place];  Service: Endoscopy;  Laterality: N/A;  . Savory dilation N/A 10/09/2014    Procedure: SAVORY DILATION;  Surgeon: Inda Castle, MD;  Location: [place];  Service: Endoscopy;  Laterality: N/A;  . Esophagogastroduodenoscopy N/A 12/12/2014    Procedure: ESOPHAGOGASTRODUODENOSCOPY (EGD);  Surgeon: Inda Castle, MD;  Location: [facility];  Service: Endoscopy;  Laterality: N/A;  with dilation  . Balloon dilation N/A 12/12/2014    Procedure: BALLOON DILATION;  Surgeon: Inda Castle, MD;  Location: [facility];  Service: Endoscopy;  Laterality: N/A;  . Orif ankle fracture Right 07/04/2015    Procedure: OPEN REDUCTION INTERNAL FIXATION (ORIF) ANKLE FRACTURE;  Surgeon: Newt Minion, MD;  Location: [place];  Service: Orthopedics;  Laterality: Right;  OPEN REDUCTION, INTERNAL FIXATION OF RIGHT ANKLE FRACTURE.   . Fracture surgery    . Colonoscopy N/A 02/07/2016    Procedure: COLONOSCOPY;  Surgeon: Doran Stabler, MD;  Location: [facility] ENDOSCOPY;  Service: Endoscopy;  Laterality: N/A;  . Orif ankle fracture Left 02/10/2016    Procedure: OPEN REDUCTION INTERNAL FIXATION (ORIF) ANKLE FRACTURE;  Surgeon: Newt Minion, MD;  Location: [place];  Service: Orthopedics;  Laterality: Left;   Family History  Problem Relation Age of Onset  . Hypertension Other   . Stroke Other   . Heart attack Other   . Hemachromatosis Other   . Rheum arthritis  both sets of grandparents and father  . Other Mother   . Other Father     failure to thrive   Social History  Substance Use Topics  . Smoking status: Former Smoker -- 3.00 packs/day for 45 years    Types: Cigarettes    Quit date: 12/05/1997  . Smokeless tobacco: Never Used  . Alcohol Use: No   OB History    No data available     Review of Systems  Respiratory: Negative for shortness of breath.   Cardiovascular: Negative for chest pain.  Gastrointestinal: Positive for nausea, vomiting and diarrhea. Negative for abdominal  pain.  Genitourinary: Positive for dysuria.  Skin: Positive for color change and wound.  All other systems reviewed and are negative.     Allergies  Carboplatin; Remicade; and Hydromorphone  Home Medications   Prior to Admission medications   Medication Sig Start Date End Date Taking? Authorizing Provider  budesonide-formoterol (SYMBICORT) 80-4.5 MCG/ACT inhaler Inhale 2 puffs into the lungs 2 (two) times daily.    Historical Provider, MD  fluticasone (CUTIVATE) 0.05 % cream Apply 1 application topically 2 (two) times daily as needed (rash).     Historical Provider, MD  gabapentin (NEURONTIN) 300 MG capsule Take 1 capsule (300 mg total) by mouth 2 (two) times daily. 02/12/16   Ripudeep Krystal Eaton, MD  HYDROcodone-acetaminophen (NORCO/VICODIN) 5-325 MG tablet Take 1-2 tablets by mouth every 6 (six) hours as needed for moderate pain. 02/20/16   Shanker Kristeen Mans, MD  insulin aspart (NOVOLOG) 100 UNIT/ML injection Inject 0-15 Units into the skin 3 (three) times daily with meals. Sliding scale  CBG 70 - 120: 0 units: CBG 121 - 150: 2 units; CBG 151 - 200: 3 units; CBG 201 - 250: 5 units; CBG 251 - 300: 8 units;CBG 301 - 350: 11 units; CBG 351 - 400: 15 units; CBG > 400 : 15 units and notify MD 02/12/16   Ripudeep Krystal Eaton, MD  insulin detemir (LEVEMIR) 100 UNIT/ML injection Inject 0.2 mLs (20 Units total) into the skin at bedtime. 02/20/16   Shanker Kristeen Mans, MD  ipratropium-albuterol (DUONEB) 0.5-2.5 (3) MG/3ML SOLN Take 3 mLs by nebulization every 4 (four) hours as needed. 02/20/16   Shanker Kristeen Mans, MD  ketoconazole (NIZORAL) 2 % cream Apply 1 application topically 2 (two) times daily as needed for irritation.  01/04/16   Historical Provider, MD  Mesalamine (ASACOL HD) 800 MG TBEC Take 3 tablets (2,400 mg total) by mouth 2 (two) times daily. 02/10/16   Nelida Meuse III, MD  saccharomyces boulardii (FLORASTOR) 250 MG capsule Take 1 capsule (250 mg total) by mouth 2 (two) times daily. 02/12/16   Ripudeep Krystal Eaton, MD  VESICARE 5 MG tablet Take 5 mg by mouth daily. 01/09/16   Historical Provider, MD  Vitamin D, Ergocalciferol, (DRISDOL) 50000 units CAPS capsule Take 1 capsule (50,000 Units total) by mouth every 7 (seven) days. 02/12/16   Ripudeep K Rai, MD   BP 141/85 mmHg  Pulse 126  Temp(Src) 97.5 F (36.4 C) (Oral)  Resp 32  SpO2 99% Physical Exam  Constitutional: She is oriented to person, place, and time. She appears well-developed and well-nourished.  HENT:  Head: Normocephalic and atraumatic.  Right Ear: External ear normal.  Left Ear: External ear normal.  Nose: Nose normal.  Mouth/Throat: Mucous membranes are dry.  Eyes: Right eye exhibits no discharge. Left eye exhibits no discharge.  Cardiovascular: Regular rhythm and normal heart sounds.  Tachycardia present.  Pulmonary/Chest: Effort normal and breath sounds normal.  Abdominal: Soft. She exhibits no distension. There is no tenderness.  Neurological: She is alert and oriented to person, place, and time.  Skin: Skin is warm and dry.     Nursing note and vitals reviewed.   ED Course  Procedures (including critical care time) Labs Review Labs Reviewed  BASIC METABOLIC PANEL - Abnormal; Notable for the following:    Sodium 126 (*)    Potassium 5.2 (*)    Chloride 94 (*)    CO2 10 (*)    Glucose, Bld 520 (*)    BUN 34 (*)    Creatinine, Ser 2.15 (*)    Calcium 7.9 (*)    GFR calc non Af Amer 21 (*)    GFR calc Af Amer 25 (*)    Anion gap 22 (*)    All other components within normal limits  CBC WITH DIFFERENTIAL/PLATELET - Abnormal; Notable for the following:    WBC 20.1 (*)    RBC 5.46 (*)    Hemoglobin 16.3 (*)    HCT 48.5 (*)    Neutro Abs 17.5 (*)    Basophils Absolute 0.4 (*)    All other components within normal limits  URINALYSIS, ROUTINE W REFLEX MICROSCOPIC (NOT AT [facility] - Abnormal; Notable for the following:    APPearance CLOUDY (*)    Glucose, UA >1000 (*)    Bilirubin Urine LARGE (*)    Ketones, ur  40 (*)    All other components within normal limits  URINE MICROSCOPIC-ADD ON - Abnormal; Notable for the following:    Squamous Epithelial / LPF 0-5 (*)    Casts HYALINE CASTS (*)    All other components within normal limits  HEPATIC FUNCTION PANEL - Abnormal; Notable for the following:    Total Protein 4.7 (*)    Albumin 2.4 (*)    ALT 8 (*)    Total Bilirubin 1.4 (*)    Indirect Bilirubin 1.2 (*)    All other components within normal limits  BASIC METABOLIC PANEL - Abnormal; Notable for the following:    Sodium 127 (*)    Chloride 94 (*)    CO2 10 (*)    Glucose, Bld 490 (*)    BUN 33 (*)    Creatinine, Ser 2.18 (*)    Calcium 7.8 (*)    GFR calc non Af Amer 21 (*)    GFR calc Af Amer 24 (*)    Anion gap 23 (*)    All other components within normal limits  I-STAT VENOUS BLOOD GAS, ED - Abnormal; Notable for the following:    pH, Ven 7.164 (*)    pCO2, Ven 27.0 (*)    pO2, Ven 26.0 (*)    Bicarbonate 9.7 (*)    Acid-base deficit 17.0 (*)    All other components within normal limits  CBG MONITORING, ED - Abnormal; Notable for the following:    Glucose-Capillary 485 (*)    All other components within normal limits  CBG MONITORING, ED - Abnormal; Notable for the following:    Glucose-Capillary 312 (*)    All other components within normal limits  CBG MONITORING, ED - Abnormal; Notable for the following:    Glucose-Capillary 220 (*)    All other components within normal limits  CBG MONITORING, ED - Abnormal; Notable for the following:    Glucose-Capillary 198 (*)    All other components within normal limits  URINE CULTURE  C  DIFFICILE QUICK SCREEN W PCR REFLEX  GASTROINTESTINAL PANEL BY PCR, STOOL (REPLACES STOOL CULTURE)  LIPASE, BLOOD  BASIC METABOLIC PANEL  BASIC METABOLIC PANEL  BASIC METABOLIC PANEL  CBC  MAGNESIUM  PHOSPHORUS  BASIC METABOLIC PANEL    Imaging Review Dg Chest Portable 1 View  02/26/2016  CLINICAL DATA:  Elevated blood sugar. Decreased  appetite. History of right middle lobe lung cancer. Clinical concern for pneumonia. EXAM: PORTABLE CHEST 1 VIEW COMPARISON:  Chest radiograph 02/18/2016.  Chest CT 11/30/2015 FINDINGS: Chronic volume loss in the right lung is unchanged. Rightward mediastinal shift is unchanged. There is no consolidation. The left lung is clear. No pulmonary edema. No pneumothorax or large pleural effusion. Osseous structures appear unchanged. IMPRESSION: No evidence of pneumonia.  Chronic volume loss in the right lung. Electronically Signed   By: Jeb Levering M.D.   On: 02/26/2016 12:35   I have personally reviewed and evaluated these images and lab results as part of my medical decision-making.   EKG Interpretation None      CRITICAL CARE Performed by: Sherwood Gambler T   Total critical care time: 35 minutes  Critical care time was exclusive of separately billable procedures and treating other patients.  Critical care was necessary to treat or prevent imminent or life-threatening deterioration.  Critical care was time spent personally by me on the following activities: development of treatment plan with patient and/or surrogate as well as nursing, discussions with consultants, evaluation of patient's response to treatment, examination of patient, obtaining history from patient or surrogate, ordering and performing treatments and interventions, ordering and review of laboratory studies, ordering and review of radiographic studies, pulse oximetry and re-evaluation of patient's condition.  MDM   Final diagnoses:  Diabetic ketoacidosis without coma associated with diabetes mellitus due to underlying condition ([place])  Metabolic acidosis  Acute kidney injury [facility])    Patient presents in recurrent DKA. She is initially tachycardic but blood pressure is adequate. Tachycardia has improved with IV fluids. She has evidence of acute kidney injury as well. PH 7.1. She is not altered however and vital signs have  improved. Think she is stable enough for admission to the stepdown unit. Unclear exactly why she has gone back into DKA. She has reports of dysuria but her urine shows no obvious UTI. She has elevated white blood cell count but no clear etiology. No fevers. At this point she will need careful monitoring and has been placed on an insulin drip. Admit to the hospitalist.    Sherwood Gambler, MD 02/26/16 [phone]

## 2016-02-27 DIAGNOSIS — R197 Diarrhea, unspecified: Secondary | ICD-10-CM

## 2016-02-27 DIAGNOSIS — N289 Disorder of kidney and ureter, unspecified: Secondary | ICD-10-CM

## 2016-02-27 DIAGNOSIS — E0810 Diabetes mellitus due to underlying condition with ketoacidosis without coma: Secondary | ICD-10-CM

## 2016-02-27 DIAGNOSIS — E860 Dehydration: Secondary | ICD-10-CM

## 2016-02-27 LAB — GASTROINTESTINAL PANEL BY PCR, STOOL (REPLACES STOOL CULTURE)
ADENOVIRUS F40/41: NOT DETECTED
Astrovirus: NOT DETECTED
CYCLOSPORA CAYETANENSIS: NOT DETECTED
Campylobacter species: NOT DETECTED
Cryptosporidium: NOT DETECTED
E. COLI O157: NOT DETECTED
ENTAMOEBA HISTOLYTICA: NOT DETECTED
ENTEROPATHOGENIC E COLI (EPEC): NOT DETECTED
Enteroaggregative E coli (EAEC): NOT DETECTED
Enterotoxigenic E coli (ETEC): NOT DETECTED
Giardia lamblia: NOT DETECTED
Norovirus GI/GII: NOT DETECTED
PLESIMONAS SHIGELLOIDES: NOT DETECTED
ROTAVIRUS A: NOT DETECTED
SALMONELLA SPECIES: NOT DETECTED
SAPOVIRUS (I, II, IV, AND V): NOT DETECTED
Shiga like toxin producing E coli (STEC): NOT DETECTED
Shigella/Enteroinvasive E coli (EIEC): NOT DETECTED
Vibrio cholerae: NOT DETECTED
Vibrio species: NOT DETECTED
YERSINIA ENTEROCOLITICA: NOT DETECTED

## 2016-02-27 LAB — BASIC METABOLIC PANEL
ANION GAP: 10 (ref 5–15)
ANION GAP: 8 (ref 5–15)
Anion gap: 10 (ref 5–15)
BUN: 26 mg/dL — ABNORMAL HIGH (ref 6–20)
BUN: 27 mg/dL — AB (ref 6–20)
BUN: 19 mg/dL (ref 6–20)
CALCIUM: 7.8 mg/dL — AB (ref 8.9–10.3)
CALCIUM: 7.3 mg/dL — AB (ref 8.9–10.3)
CHLORIDE: 102 mmol/L (ref 101–111)
CHLORIDE: 103 mmol/L (ref 101–111)
CO2: 17 mmol/L — ABNORMAL LOW (ref 22–32)
CO2: 18 mmol/L — AB (ref 22–32)
CO2: 18 mmol/L — AB (ref 22–32)
Calcium: 7.6 mg/dL — ABNORMAL LOW (ref 8.9–10.3)
Chloride: 103 mmol/L (ref 101–111)
Creatinine, Ser: 1.31 mg/dL — ABNORMAL HIGH (ref 0.44–1.00)
Creatinine, Ser: 1.11 mg/dL — ABNORMAL HIGH (ref 0.44–1.00)
Creatinine, Ser: 0.88 mg/dL (ref 0.44–1.00)
GFR calc Af Amer: 45 mL/min — ABNORMAL LOW (ref >60)
GFR calc non Af Amer: 39 mL/min — ABNORMAL LOW (ref >60)
GFR calc non Af Amer: 48 mL/min — ABNORMAL LOW (ref >60)
GFR calc non Af Amer: >60 mL/min (ref >60)
GFR, EST AFRICAN AMERICAN: 55 mL/min — AB (ref >60)
GLUCOSE: 260 mg/dL — AB (ref 65–99)
Glucose, Bld: 137 mg/dL — ABNORMAL HIGH (ref 65–99)
Glucose, Bld: 138 mg/dL — ABNORMAL HIGH (ref 65–99)
POTASSIUM: 4.9 mmol/L (ref 3.5–5.1)
Potassium: 3.9 mmol/L (ref 3.5–5.1)
Potassium: 3.5 mmol/L (ref 3.5–5.1)
SODIUM: 129 mmol/L — AB (ref 135–145)
Sodium: 129 mmol/L — ABNORMAL LOW (ref 135–145)
Sodium: 131 mmol/L — ABNORMAL LOW (ref 135–145)

## 2016-02-27 LAB — GLUCOSE, CAPILLARY
GLUCOSE-CAPILLARY: 122 mg/dL — AB (ref 65–99)
GLUCOSE-CAPILLARY: 128 mg/dL — AB (ref 65–99)
GLUCOSE-CAPILLARY: 129 mg/dL — AB (ref 65–99)
GLUCOSE-CAPILLARY: 137 mg/dL — AB (ref 65–99)
GLUCOSE-CAPILLARY: 143 mg/dL — AB (ref 65–99)
GLUCOSE-CAPILLARY: 156 mg/dL — AB (ref 65–99)
GLUCOSE-CAPILLARY: 163 mg/dL — AB (ref 65–99)
GLUCOSE-CAPILLARY: 175 mg/dL — AB (ref 65–99)
GLUCOSE-CAPILLARY: 196 mg/dL — AB (ref 65–99)
GLUCOSE-CAPILLARY: 226 mg/dL — AB (ref 65–99)
GLUCOSE-CAPILLARY: 78 mg/dL (ref 65–99)
Glucose-Capillary: 128 mg/dL — ABNORMAL HIGH (ref 65–99)
Glucose-Capillary: 133 mg/dL — ABNORMAL HIGH (ref 65–99)
Glucose-Capillary: 161 mg/dL — ABNORMAL HIGH (ref 65–99)
Glucose-Capillary: 164 mg/dL — ABNORMAL HIGH (ref 65–99)
Glucose-Capillary: 172 mg/dL — ABNORMAL HIGH (ref 65–99)
Glucose-Capillary: 203 mg/dL — ABNORMAL HIGH (ref 65–99)
Glucose-Capillary: 215 mg/dL — ABNORMAL HIGH (ref 65–99)
Glucose-Capillary: 258 mg/dL — ABNORMAL HIGH (ref 65–99)
Glucose-Capillary: 141 mg/dL — ABNORMAL HIGH (ref 65–99)
Glucose-Capillary: 67 mg/dL (ref 65–99)

## 2016-02-27 LAB — CBC
HCT: 40.3 % (ref 36.0–46.0)
Hemoglobin: 13.2 g/dL (ref 12.0–15.0)
MCH: 28.7 pg (ref 26.0–34.0)
MCHC: 32.8 g/dL (ref 30.0–36.0)
MCV: 87.6 fL (ref 78.0–100.0)
Platelets: 330 K/uL (ref 150–400)
RBC: 4.6 MIL/uL (ref 3.87–5.11)
RDW: 15.3 % (ref 11.5–15.5)
WBC: 17.2 K/uL — ABNORMAL HIGH (ref 4.0–10.5)

## 2016-02-27 MED ORDER — INSULIN GLARGINE 100 UNIT/ML ~~LOC~~ SOLN
20.0000 [IU] | Freq: Every day | SUBCUTANEOUS | Status: DC
  Administered 2016-02-27: 20 [IU] via SUBCUTANEOUS
  Filled 2016-02-27 (×2): qty 0.2

## 2016-02-27 MED ORDER — INSULIN ASPART 100 UNIT/ML ~~LOC~~ SOLN
0.0000 [IU] | Freq: Three times a day (TID) | SUBCUTANEOUS | Status: DC
  Administered 2016-02-27: 2 [IU] via SUBCUTANEOUS
  Administered 2016-02-28 (×2): 3 [IU] via SUBCUTANEOUS
  Administered 2016-02-28: 5 [IU] via SUBCUTANEOUS
  Administered 2016-02-29 (×2): 11 [IU] via SUBCUTANEOUS
  Administered 2016-02-29: 8 [IU] via SUBCUTANEOUS
  Administered 2016-03-01 (×2): 5 [IU] via SUBCUTANEOUS
  Administered 2016-03-01: 11 [IU] via SUBCUTANEOUS

## 2016-02-27 MED ORDER — SODIUM CHLORIDE 0.9 % IV SOLN
INTRAVENOUS | Status: DC
  Administered 2016-02-27 – 2016-02-29 (×3): via INTRAVENOUS

## 2016-02-27 MED ORDER — INSULIN GLARGINE 100 UNIT/ML ~~LOC~~ SOLN
20.0000 [IU] | Freq: Every day | SUBCUTANEOUS | Status: DC
  Filled 2016-02-27: qty 0.2

## 2016-02-27 MED ORDER — INSULIN ASPART 100 UNIT/ML ~~LOC~~ SOLN
0.0000 [IU] | Freq: Every day | SUBCUTANEOUS | Status: DC
  Administered 2016-02-28: 5 [IU] via SUBCUTANEOUS
  Administered 2016-02-29: 3 [IU] via SUBCUTANEOUS
  Administered 2016-03-01: 2 [IU] via SUBCUTANEOUS

## 2016-02-27 NOTE — Progress Notes (Signed)
Hypoglycemic Event  CBG: 67  Treatment: Food  Symptoms: None  Follow-up CBG: Time:2140 CBG Result:78   Possible Reasons for Event: Poor appetite   2 Comments/MD notified:yes    Megan [name]

## 2016-02-27 NOTE — Progress Notes (Signed)
Difficult to assess urine output due to pts very loose stools (hard to distinguish if there is urine mixed in) and pt is incontinent and cannot say if she has urinated or not.  Bladder scan @ 0200 revealed 250 ml in bladder.  Pt did void and soak the bed at 0500.  RN will continue to monitor.

## 2016-02-27 NOTE — Progress Notes (Signed)
[place] TEAM 1 - Stepdown/ICU TEAM PROGRESS NOTE  SUDIE BANDEL YDX:[id number] DOB: Feb 02, 1941 DOA: 02/26/2016 PCP: [name], MD  Admit HPI / Brief Narrative: 75 y.o. female with a history of malignant neoplasm RUL, RA, persisting diarrhea w/ diffuse colitis since early April 2017, and DM w/ diabetic ketoacidosis/coma who presented to the ED with hyperglycemia. Evaluation in the emergency room revealed DKA.  Patient was discharged from a rehabilitation facility 2 days prior. Stated she had not been eating because she "gags" on medications and food.   HPI/Subjective: Pt clarifies to me that she has had diarrhea off and on since pretty much January but that it has never stopped/has been continuous since the beginning of April.  She reports severe nausea and regurgitation as well which has severely limited her intake.  She denies chest pain shortness of breath or headache.  Assessment/Plan:  DKA Gap closed and bicarb improved - transition off insulin gtt - follow CBGs and metabolic parameters  Acute renal injury Due to prerenal azotemia - improving w/ hydration - follow   Recent Labs Lab 02/26/16 1213 02/26/16 1656 02/26/16 2032 02/27/16 0019 02/27/16 0401  CREATININE 2.18* 1.39* 1.24* 1.31* 1.11*    Recent diffuse colitis - ongoing diarrhea  Idiopathic - previously thought to have been related to her immunotherapy, but she has now been off that for 3 months - w/u underway  Stage III Squamous cell CA of  R lung Followed by Dr. Inda Merlin  Recent L distal fibula fx s/p surgical repair Has not been able to participate w/ rehab due to weakness/general FTT  Stage 1 skin breakdown - low back/sacrum WOC Team / RN following   Code Status: FULL Family Communication: spoke w/ pt and her long term friend at bedside  Disposition Plan: SDU   Consultants: none  Procedures: none  Antibiotics: none  DVT prophylaxis: lovenox   Objective: Blood pressure  130/58, pulse 111, temperature 97.5 F (36.4 C), temperature source Oral, resp. rate 14, height '5\' 6"'$  (1.676 m), weight 73 kg (160 lb 15 oz), SpO2 99 %.  Intake/Output Summary (Last 24 hours) at 02/27/16 1150 Last data filed at 02/27/16 0700  Gross per 24 hour  Intake 1077.5 ml  Output      0 ml  Net 1077.5 ml   Exam: General: No acute respiratory distress - cachectic  Lungs: Clear to auscultation bilaterally without wheezes or crackles Cardiovascular: Regular rate and rhythm without murmur gallop or rub normal S1 and S2 Abdomen: Nontender, nondistended, soft, bowel sounds positive, no rebound, no ascites, no appreciable mass Extremities: No significant cyanosis, clubbing, or edema bilateral lower extremities  Data Reviewed:  Basic Metabolic Panel:  Recent Labs Lab 02/26/16 1213 02/26/16 1656 02/26/16 2032 02/27/16 0019 02/27/16 0401  NA 127* 130* 131* 129* 131*  K 4.8 4.4 4.5 4.9 3.9  CL 94* 102 103 102 103  CO2 10* 18* 18* 17* 18*  GLUCOSE 490* 202* 128* 260* 137*  BUN 33* 30* 28* 27* 26*  CREATININE 2.18* 1.39* 1.24* 1.31* 1.11*  CALCIUM 7.8* 7.5* 7.7* 7.6* 7.8*  MG  --  1.7  --   --   --   PHOS  --  3.6  --   --   --     CBC:  Recent Labs Lab 02/26/16 1036 02/27/16 0019  WBC 20.1* 17.2*  NEUTROABS 17.5*  --   HGB 16.3* 13.2  HCT 48.5* 40.3  MCV 88.8 87.6  PLT 355 330    Liver  Function Tests:  Recent Labs Lab 02/26/16 1213  AST 15  ALT 8*  ALKPHOS 113  BILITOT 1.4*  PROT 4.7*  ALBUMIN 2.4*    Recent Labs Lab 02/26/16 1213  LIPASE 18    CBG:  Recent Labs Lab 02/27/16 0650 02/27/16 0757 02/27/16 0859 02/27/16 1013 02/27/16 1125  GLUCAP 203* 172* 137* 143* 164*    Recent Results (from the past 240 hour(s))  MRSA PCR Screening     Status: None   Collection Time: 02/17/16 12:52 PM  Result Value Ref Range Status   MRSA by PCR NEGATIVE NEGATIVE Final    Comment:        The GeneXpert MRSA Assay (FDA approved for NASAL  specimens only), is one component of a comprehensive MRSA colonization surveillance program. It is not intended to diagnose MRSA infection nor to guide or monitor treatment for MRSA infections.   Urine culture     Status: None (Preliminary result)   Collection Time: 02/26/16 11:26 AM  Result Value Ref Range Status   Specimen Description URINE, CATHETERIZED  Final   Special Requests NONE  Final   Culture TOO YOUNG TO READ  Final   Report Status PENDING  Incomplete  C difficile quick scan w PCR reflex     Status: None   Collection Time: 02/26/16  5:39 PM  Result Value Ref Range Status   C Diff antigen NEGATIVE NEGATIVE Final   C Diff toxin NEGATIVE NEGATIVE Final   C Diff interpretation Negative for toxigenic C. difficile  Final  MRSA PCR Screening     Status: None   Collection Time: 02/26/16  7:15 PM  Result Value Ref Range Status   MRSA by PCR NEGATIVE NEGATIVE Final    Comment:        The GeneXpert MRSA Assay (FDA approved for NASAL specimens only), is one component of a comprehensive MRSA colonization surveillance program. It is not intended to diagnose MRSA infection nor to guide or monitor treatment for MRSA infections.      Studies:   Recent x-ray studies have been reviewed in detail by the Attending Physician  Scheduled Meds:  Scheduled Meds: . collagenase   Topical Daily  . darifenacin  7.5 mg Oral Daily  . enoxaparin (LOVENOX) injection  30 mg Subcutaneous Q24H  . gabapentin  300 mg Oral BID  . mesalamine  2,400 mg Oral BID  . mometasone-formoterol  2 puff Inhalation BID  . zinc oxide   Topical BID    Time spent on care of this patient: 35 mins   MCCLUNG,JEFFREY T , MD   [facility] Office  [phone] Pager - Text Page per Shea Evans as per below:  On-Call/Text Page:      [email or web address]      password [email or web address]  If 7PM-7AM, please contact night-coverage [email or web address] Password [email or web address] 02/27/2016, 11:50 AM   LOS: 1 day

## 2016-02-27 NOTE — Plan of Care (Signed)
Problem: Bowel/Gastric: Goal: Will not experience complications related to bowel motility Outcome: Not Progressing Pt has uncontrolled diarrhea that has been ongoing for months and is not receiving anything for treatment. Cdiff and GI panel negative.

## 2016-02-27 NOTE — Progress Notes (Signed)
Pt receives mesalamine to treat colitis however the mesalamine comes out with her bowel movements completely whole and unprocessed.  Pt says this always happens.  She may need to take something else considering her body is not receiving the medications effects and that her diarrhea is uncontrollable with 5-7 episodes daily.

## 2016-02-27 NOTE — Plan of Care (Signed)
Problem: Skin Integrity: Goal: Risk for impaired skin integrity will decrease Outcome: Progressing Pt has rash on buttocks area most likely from wearing diapers at home.  Cream is applied BID, and area is kept as dry as possible for an incontinent patient.

## 2016-02-28 DIAGNOSIS — E1310 Other specified diabetes mellitus with ketoacidosis without coma: Principal | ICD-10-CM

## 2016-02-28 DIAGNOSIS — K51019 Ulcerative (chronic) pancolitis with unspecified complications: Secondary | ICD-10-CM

## 2016-02-28 LAB — COMPREHENSIVE METABOLIC PANEL
ALT: 8 U/L — AB (ref 14–54)
AST: 16 U/L (ref 15–41)
Albumin: 1.9 g/dL — ABNORMAL LOW (ref 3.5–5.0)
Alkaline Phosphatase: 78 U/L (ref 38–126)
Anion gap: 9 (ref 5–15)
BILIRUBIN TOTAL: 1 mg/dL (ref 0.3–1.2)
BUN: 15 mg/dL (ref 6–20)
CHLORIDE: 104 mmol/L (ref 101–111)
CO2: 20 mmol/L — ABNORMAL LOW (ref 22–32)
CREATININE: 0.89 mg/dL (ref 0.44–1.00)
Calcium: 7.6 mg/dL — ABNORMAL LOW (ref 8.9–10.3)
Glucose, Bld: 153 mg/dL — ABNORMAL HIGH (ref 65–99)
POTASSIUM: 4.3 mmol/L (ref 3.5–5.1)
Sodium: 133 mmol/L — ABNORMAL LOW (ref 135–145)
TOTAL PROTEIN: 3.6 g/dL — AB (ref 6.5–8.1)

## 2016-02-28 LAB — CBC
HCT: 39.1 % (ref 36.0–46.0)
Hemoglobin: 12.9 g/dL (ref 12.0–15.0)
MCH: 29.7 pg (ref 26.0–34.0)
MCHC: 33 g/dL (ref 30.0–36.0)
MCV: 89.9 fL (ref 78.0–100.0)
PLATELETS: 307 10*3/uL (ref 150–400)
RBC: 4.35 MIL/uL (ref 3.87–5.11)
RDW: 15.9 % — AB (ref 11.5–15.5)
WBC: 10.5 10*3/uL (ref 4.0–10.5)

## 2016-02-28 LAB — GLUCOSE, CAPILLARY
GLUCOSE-CAPILLARY: 140 mg/dL — AB (ref 65–99)
GLUCOSE-CAPILLARY: 177 mg/dL — AB (ref 65–99)
GLUCOSE-CAPILLARY: 355 mg/dL — AB (ref 65–99)
Glucose-Capillary: 241 mg/dL — ABNORMAL HIGH (ref 65–99)
Glucose-Capillary: 179 mg/dL — ABNORMAL HIGH (ref 65–99)

## 2016-02-28 LAB — URINE CULTURE

## 2016-02-28 LAB — TSH: TSH: 8.675 u[IU]/mL — ABNORMAL HIGH (ref 0.350–4.500)

## 2016-02-28 MED ORDER — INSULIN GLARGINE 100 UNIT/ML ~~LOC~~ SOLN
14.0000 [IU] | Freq: Every day | SUBCUTANEOUS | Status: DC
  Administered 2016-02-28: 14 [IU] via SUBCUTANEOUS
  Filled 2016-02-28 (×3): qty 0.14

## 2016-02-28 MED ORDER — INSULIN GLARGINE 100 UNIT/ML ~~LOC~~ SOLN
10.0000 [IU] | Freq: Every day | SUBCUTANEOUS | Status: DC
  Filled 2016-02-28: qty 0.1

## 2016-02-28 MED ORDER — LOPERAMIDE HCL 2 MG PO CAPS: 2.0000 mg | ORAL_CAPSULE | ORAL | Status: DC | PRN

## 2016-02-28 MED ORDER — METHYLPREDNISOLONE SODIUM SUCC 40 MG IJ SOLR
40.0000 mg | Freq: Three times a day (TID) | INTRAMUSCULAR | Status: DC
  Administered 2016-02-28 – 2016-03-01 (×6): 40 mg via INTRAVENOUS
  Filled 2016-02-28 (×6): qty 1

## 2016-02-28 NOTE — Progress Notes (Signed)
[facility] TEAM 1 - Stepdown/ICU TEAM PROGRESS NOTE  DELAILAH SPIETH ELF:[id number] DOB: 1941/04/29 DOA: 02/26/2016 PCP: [name], MD  Admit HPI / Brief Narrative: 75 y.o. female with a history of malignant neoplasm RUL, RA, persisting diarrhea w/ diffuse colitis since early April 2017, and DM w/ diabetic ketoacidosis/coma who presented to the ED with hyperglycemia. Evaluation in the emergency room revealed DKA.  Patient was discharged from a rehabilitation facility 2 days prior. Stated she had not been eating because she "gags" on medications and food.   HPI/Subjective: Pt is feeling much better in general today.  She reports some ability to consume soup and a shake.  She denies hematochezia, but does continue to have diarrhea.  Denies cp, vomiting, or sob.    Assessment/Plan:  DKA - uncontrolled DM2 DKA now resolved - some relative hypoglycemia last night - adjust lantus - follow CBG trend - high risk to rebound into DKA w/ need for steroid - follow in SDU 24hrs additional   Uncontrolled Severe Ulcerative colitis - ongoing diarrhea  Diarrhea initially thought to have been related to her immunotherapy (has now been off for ~2.5 months) - biopsy during colonoscopy 02/07/16 c/w UC, and pt was started on mesalamine - has continued to have diarrhea - will begin short course of steroid, and ask her GI MD to see her in the morning   Acute renal injury - resolved Due to prerenal azotemia due to very limited intake   Recent Labs Lab 02/26/16 2032 02/27/16 0019 02/27/16 0401 02/27/16 1806 02/28/16 0239  CREATININE 1.24* 1.31* 1.11* 0.88 0.89    Possible hypothyroidism TSH elevated on last 2 checks - check FT4  Stage III Squamous cell CA of  R lung Followed by Dr. Inda Merlin - due to see him in clinic tomorrow so will alert him to her admit   Recent L distal fibula fx s/p surgical repair Has not been able to participate w/ rehab due to weakness/general FTT  Stage 1 skin  breakdown - low back/sacrum WOC Team / RN following   Code Status: FULL Family Communication: spoke w/ pt and her long term friend at bedside  Disposition Plan: SDU   Consultants: none  Procedures: none  Antibiotics: none  DVT prophylaxis: lovenox   Objective: Blood pressure 128/64, pulse 103, temperature 97.6 F (36.4 C), temperature source Oral, resp. rate 14, height '5\' 6"'$  (1.676 m), weight 73 kg (160 lb 15 oz), SpO2 97 %.  Intake/Output Summary (Last 24 hours) at 02/28/16 1047 Last data filed at 02/28/16 0944  Gross per 24 hour  Intake   1263 ml  Output      0 ml  Net   1263 ml   Exam: General: No acute respiratory distress - cachectic  Lungs: Clear to auscultation bilaterally without wheezes  Cardiovascular: Regular rhythm w/ mild tachycardia - without murmur gallop or rub  Abdomen: mildly tender diffusely, soft, bowel sounds positive, no rebound, no ascites, no appreciable mass Extremities: No significant cyanosis, clubbing, or edema bilateral lower extremities  Data Reviewed:  Basic Metabolic Panel:  Recent Labs Lab 02/26/16 1656 02/26/16 2032 02/27/16 0019 02/27/16 0401 02/27/16 1806 02/28/16 0239  NA 130* 131* 129* 131* 129* 133*  K 4.4 4.5 4.9 3.9 3.5 4.3  CL 102 103 102 103 103 104  CO2 18* 18* 17* 18* 18* 20*  GLUCOSE 202* 128* 260* 137* 138* 153*  BUN 30* 28* 27* 26* 19 15  CREATININE 1.39* 1.24* 1.31* 1.11* 0.88 0.89  CALCIUM  7.5* 7.7* 7.6* 7.8* 7.3* 7.6*  MG 1.7  --   --   --   --   --   PHOS 3.6  --   --   --   --   --     CBC:  Recent Labs Lab 02/26/16 1036 02/27/16 0019 02/28/16 0239  WBC 20.1* 17.2* 10.5  NEUTROABS 17.5*  --   --   HGB 16.3* 13.2 12.9  HCT 48.5* 40.3 39.1  MCV 88.8 87.6 89.9  PLT 355 330 307    Liver Function Tests:  Recent Labs Lab 02/26/16 1213 02/28/16 0239  AST 15 16  ALT 8* 8*  ALKPHOS 113 78  BILITOT 1.4* 1.0  PROT 4.7* 3.6*  ALBUMIN 2.4* 1.9*    Recent Labs Lab 02/26/16 1213  LIPASE  18    CBG:  Recent Labs Lab 02/27/16 1658 02/27/16 2114 02/27/16 2143 02/28/16 0007 02/28/16 0824  GLUCAP 141* 67 78 140* 177*    Recent Results (from the past 240 hour(s))  Urine culture     Status: None (Preliminary result)   Collection Time: 02/26/16 11:26 AM  Result Value Ref Range Status   Specimen Description URINE, CATHETERIZED  Final   Special Requests NONE  Final   Culture TOO YOUNG TO READ  Final   Report Status PENDING  Incomplete  C difficile quick scan w PCR reflex     Status: None   Collection Time: 02/26/16  5:39 PM  Result Value Ref Range Status   C Diff antigen NEGATIVE NEGATIVE Final   C Diff toxin NEGATIVE NEGATIVE Final   C Diff interpretation Negative for toxigenic C. difficile  Final  Gastrointestinal Panel by PCR , Stool     Status: None   Collection Time: 02/26/16  5:39 PM  Result Value Ref Range Status   Campylobacter species NOT DETECTED NOT DETECTED Final   Plesimonas shigelloides NOT DETECTED NOT DETECTED Final   Salmonella species NOT DETECTED NOT DETECTED Final   Yersinia enterocolitica NOT DETECTED NOT DETECTED Final   Vibrio species NOT DETECTED NOT DETECTED Final   Vibrio cholerae NOT DETECTED NOT DETECTED Final   Enteroaggregative E coli (EAEC) NOT DETECTED NOT DETECTED Final   Enteropathogenic E coli (EPEC) NOT DETECTED NOT DETECTED Final   Enterotoxigenic E coli (ETEC) NOT DETECTED NOT DETECTED Final   Shiga like toxin producing E coli (STEC) NOT DETECTED NOT DETECTED Final   E. coli O157 NOT DETECTED NOT DETECTED Final   Shigella/Enteroinvasive E coli (EIEC) NOT DETECTED NOT DETECTED Final   Cryptosporidium NOT DETECTED NOT DETECTED Final   Cyclospora cayetanensis NOT DETECTED NOT DETECTED Final   Entamoeba histolytica NOT DETECTED NOT DETECTED Final   Giardia lamblia NOT DETECTED NOT DETECTED Final   Adenovirus F40/41 NOT DETECTED NOT DETECTED Final   Astrovirus NOT DETECTED NOT DETECTED Final   Norovirus GI/GII NOT DETECTED NOT  DETECTED Final   Rotavirus A NOT DETECTED NOT DETECTED Final   Sapovirus (I, II, IV, and V) NOT DETECTED NOT DETECTED Final  MRSA PCR Screening     Status: None   Collection Time: 02/26/16  7:15 PM  Result Value Ref Range Status   MRSA by PCR NEGATIVE NEGATIVE Final    Comment:        The GeneXpert MRSA Assay (FDA approved for NASAL specimens only), is one component of a comprehensive MRSA colonization surveillance program. It is not intended to diagnose MRSA infection nor to guide or monitor treatment for MRSA infections.  Studies:   Recent x-ray studies have been reviewed in detail by the Attending Physician  Scheduled Meds:  Scheduled Meds: . collagenase   Topical Daily  . darifenacin  7.5 mg Oral Daily  . enoxaparin (LOVENOX) injection  30 mg Subcutaneous Q24H  . gabapentin  300 mg Oral BID  . insulin aspart  0-15 Units Subcutaneous TID WC  . insulin aspart  0-5 Units Subcutaneous QHS  . insulin glargine  20 Units Subcutaneous QHS  . mesalamine  2,400 mg Oral BID  . mometasone-formoterol  2 puff Inhalation BID  . zinc oxide   Topical BID    Time spent on care of this patient: 35 mins   MCCLUNG,JEFFREY T , MD   [facility] Office  [phone] Pager - Text Page per Shea Evans as per below:  On-Call/Text Page:      [email or web address]      password [email or web address]  If 7PM-7AM, please contact night-coverage [email or web address] Password [email or web address] 02/28/2016, 10:47 AM   LOS: 2 days

## 2016-02-29 ENCOUNTER — Other Ambulatory Visit: Payer: Medicare Other

## 2016-02-29 ENCOUNTER — Ambulatory Visit (HOSPITAL_COMMUNITY): Payer: Medicare Other

## 2016-02-29 DIAGNOSIS — E119 Type 2 diabetes mellitus without complications: Secondary | ICD-10-CM

## 2016-02-29 DIAGNOSIS — E440 Moderate protein-calorie malnutrition: Secondary | ICD-10-CM | POA: Insufficient documentation

## 2016-02-29 DIAGNOSIS — Z794 Long term (current) use of insulin: Secondary | ICD-10-CM

## 2016-02-29 DIAGNOSIS — K51919 Ulcerative colitis, unspecified with unspecified complications: Secondary | ICD-10-CM

## 2016-02-29 LAB — CBC
HCT: 34.1 % — ABNORMAL LOW (ref 36.0–46.0)
Hemoglobin: 11.5 g/dL — ABNORMAL LOW (ref 12.0–15.0)
MCH: 29.9 pg (ref 26.0–34.0)
MCHC: 33.7 g/dL (ref 30.0–36.0)
MCV: 88.8 fL (ref 78.0–100.0)
Platelets: 256 10*3/uL (ref 150–400)
RBC: 3.84 MIL/uL — ABNORMAL LOW (ref 3.87–5.11)
RDW: 15.8 % — ABNORMAL HIGH (ref 11.5–15.5)
WBC: 6.4 10*3/uL (ref 4.0–10.5)

## 2016-02-29 LAB — COMPREHENSIVE METABOLIC PANEL
ALBUMIN: 1.9 g/dL — AB (ref 3.5–5.0)
ALK PHOS: 82 U/L (ref 38–126)
ALT: 7 U/L — AB (ref 14–54)
ANION GAP: 7 (ref 5–15)
AST: 8 U/L — ABNORMAL LOW (ref 15–41)
BILIRUBIN TOTAL: 0.5 mg/dL (ref 0.3–1.2)
BUN: 10 mg/dL (ref 6–20)
CO2: 21 mmol/L — AB (ref 22–32)
Calcium: 7.4 mg/dL — ABNORMAL LOW (ref 8.9–10.3)
Chloride: 105 mmol/L (ref 101–111)
Creatinine, Ser: 0.67 mg/dL (ref 0.44–1.00)
GFR calc Af Amer: >60 mL/min (ref >60)
GFR calc non Af Amer: >60 mL/min (ref >60)
GLUCOSE: 352 mg/dL — AB (ref 65–99)
Potassium: 4.4 mmol/L (ref 3.5–5.1)
SODIUM: 133 mmol/L — AB (ref 135–145)
TOTAL PROTEIN: 3.6 g/dL — AB (ref 6.5–8.1)

## 2016-02-29 LAB — GLUCOSE, CAPILLARY
GLUCOSE-CAPILLARY: 342 mg/dL — AB (ref 65–99)
Glucose-Capillary: 307 mg/dL — ABNORMAL HIGH (ref 65–99)
Glucose-Capillary: 291 mg/dL — ABNORMAL HIGH (ref 65–99)

## 2016-02-29 LAB — T4, FREE: Free T4: 0.93 ng/dL (ref 0.61–1.12)

## 2016-02-29 MED ORDER — DIPHENHYDRAMINE HCL 50 MG/ML IJ SOLN
12.5000 mg | Freq: Once | INTRAMUSCULAR | Status: AC
  Administered 2016-02-29: 12.5 mg via INTRAVENOUS
  Filled 2016-02-29: qty 1

## 2016-02-29 MED ORDER — INSULIN GLARGINE 100 UNIT/ML ~~LOC~~ SOLN
20.0000 [IU] | Freq: Every day | SUBCUTANEOUS | Status: DC
  Administered 2016-02-29: 20 [IU] via SUBCUTANEOUS
  Filled 2016-02-29 (×2): qty 0.2

## 2016-02-29 MED ORDER — INSULIN ASPART 100 UNIT/ML ~~LOC~~ SOLN
4.0000 [IU] | Freq: Three times a day (TID) | SUBCUTANEOUS | Status: DC
  Administered 2016-02-29 – 2016-03-01 (×4): 4 [IU] via SUBCUTANEOUS

## 2016-02-29 MED ORDER — DIPHENHYDRAMINE HCL 25 MG PO CAPS
25.0000 mg | ORAL_CAPSULE | Freq: Once | ORAL | Status: DC
  Filled 2016-02-29: qty 1

## 2016-02-29 NOTE — Evaluation (Signed)
Physical Therapy Evaluation Patient Details Name: Megan [name] MRN: [id number] DOB: [birth year] Today's Date: 02/29/2016   History of Present Illness  75 y.o. female with a history of malignant neoplasm RUL, RA, persisting diarrhea w/ diffuse colitis since early April 2017, and DM w/ diabetic ketoacidosis/coma who presented to the ED with hyperglycemia. Evaluation in the emergency room revealed DKA.  Pt has been recovering from left tib fib fracture as weell. Of note, pt was discharged from a SNF to home 2 days prior to admit.  Clinical Impression  Pt admitted with above diagnosis. Pt currently with functional limitations due to the deficits listed below (see PT Problem List). Pt was able to transfer to chair with min assist and cues but could not maintain TTWB.  Sent text page and note to Dr. Thereasa Solo to try and consult Dr. Sharol Given while pt in hospital as need to clarify WB status as well as if CAM boot still needed as pt left hers at home and will need one ordered if she still needs it.  Will follow acutely.  Pt will benefit from skilled PT to increase their independence and safety with mobility to allow discharge to the venue listed below.      Follow Up Recommendations SNF (depends on if pt can weight bear, then HHPT appropriate)    Equipment Recommendations  None recommended by PT    Recommendations for Other Services       Precautions / Restrictions Precautions Precautions: Fall Required Braces or Orthoses: Other Brace/Splint Other Brace/Splint: CAM boot on when up Restrictions Weight Bearing Restrictions: Yes LLE Weight Bearing: Touchdown weight bearing Other Position/Activity Restrictions: Have asked Dr. Thereasa Solo to contact Dr. Sharol Given to address precautions for left LE at present time. Pt left her CAM boot at home.  Need Dr. Sharol Given to advise as to how fxs are healing and update precautions.       Mobility  Bed Mobility Overal bed mobility: Needs Assistance Bed Mobility: Supine to  Sit;Rolling Rolling: Supervision   Supine to sit: Min assist     General bed mobility comments: A with L LE and bringing hips towards EOB.    Transfers Overall transfer level: Needs assistance Equipment used: Rolling walker (2 wheeled) Transfers: Sit to/from [facility] Sit to Stand: Min assist;+2 safety/equipment Stand pivot transfers: Min assist;+2 safety/equipment       General transfer comment: pt not maintaining TDWBing on L LE despite cueing therefore assisted pt to maintain.  pt tends to take pivotal steps towards recliner.    Ambulation/Gait                Stairs            Wheelchair Mobility    Modified Rankin (Stroke Patients Only)       Balance Overall balance assessment: Needs assistance;History of Falls Sitting-balance support: No upper extremity supported;Feet supported Sitting balance-Leahy Scale: Fair     Standing balance support: Bilateral upper extremity supported;During functional activity Standing balance-Leahy Scale: Poor Standing balance comment: requires both hands on RW for weight bearing status and even then does not maintain unless cued constantly.                              Pertinent Vitals/Pain Pain Assessment: Faces Faces Pain Scale: Hurts little more Pain Location: left LE Pain Descriptors / Indicators: Grimacing;Guarding;Discomfort Pain Intervention(s): Limited activity within patient's tolerance;Monitored during session;Repositioned  VSS    Home  Living Family/patient expects to be discharged to:: Private residence Living Arrangements: Alone   Type of Home: House Home Access: Stairs to enter   [employer] of Steps: 1 Home Layout: One level Home Equipment: [place] - 4 wheels;Walker - standard;Shower seat;Walker - 2 wheels;Crutches Additional Comments: pt had been at [employer] since D/C on 02/12/16.    Prior Function Level of Independence: Needs assistance   Gait / Transfers  Assistance Needed: Was limited at SNF per pt to transfers mostly due to poor tolerance of activity.    ADL's / Homemaking Assistance Needed: pt states she could do sponge bath but struggles to dress        Hand Dominance   Dominant Hand: Right    Extremity/Trunk Assessment   Upper Extremity Assessment: Defer to OT evaluation           Lower Extremity Assessment: LLE deficits/detail RLE Deficits / Details: hip flex 3/5.    Cervical / Trunk Assessment: Normal  Communication   Communication: HOH  Cognition Arousal/Alertness: Awake/alert Behavior During Therapy: WFL for tasks assessed/performed Overall Cognitive Status: No family/caregiver present to determine baseline cognitive functioning                      General Comments      Exercises        Assessment/Plan    PT Assessment Patient needs continued PT services  PT Diagnosis Difficulty walking;Acute pain;Generalized weakness   PT Problem List Decreased strength;Decreased balance;Decreased activity tolerance;Decreased mobility;Decreased coordination;Decreased cognition;Decreased knowledge of use of DME;Decreased safety awareness;Decreased knowledge of precautions;Impaired sensation  PT Treatment Interventions DME instruction;Gait training;Functional mobility training;Therapeutic activities;Therapeutic exercise;Balance training;Neuromuscular re-education;Patient/family education;Cognitive remediation   PT Goals (Current goals can be found in the Care Plan section) Acute Rehab PT Goals Patient Stated Goal: Return home  PT Goal Formulation: With patient Time For Goal Achievement: 03/14/16 Potential to Achieve Goals: Good    Frequency Min 3X/week   Barriers to discharge Inaccessible home environment;Decreased caregiver support      Co-evaluation               End of Session Equipment Utilized During Treatment: Gait belt Activity Tolerance: Patient limited by fatigue Patient left: in  chair;with call bell/phone within reach Nurse Communication: Mobility status         Time: 7741-4239 PT Time Calculation (min) (ACUTE ONLY): 26 min   Charges:   PT Evaluation $PT Eval Moderate Complexity: 1 Procedure PT Treatments $Therapeutic Activity: 8-22 mins   PT G CodesDenice Paradise 2016-03-21, 4:40 PM Tevyn Codd,PT Acute Rehabilitation [phone] (pager)

## 2016-02-29 NOTE — Progress Notes (Signed)
[facility] TEAM 1 - Stepdown/ICU TEAM PROGRESS NOTE  BRACHA FRANKOWSKI ION:[id number] DOB: May 31, 1941 DOA: 02/26/2016 PCP: [name], MD  Admit HPI / Brief Narrative: 75 y.o. female with a history of malignant neoplasm RUL, RA, persisting diarrhea w/ diffuse colitis since early April 2017, and DM w/ diabetic ketoacidosis/coma who presented to the ED with hyperglycemia. Evaluation in the emergency room revealed DKA.  Patient was discharged from a rehabilitation facility 2 days prior. Stated she had not been eating because she "gags" on medications and food.   HPI/Subjective: The patient states she is feeling temperamental which she blames on the steroids.  She admits to improved intake and appetite.  Diarrhea continues but she feels that it is less frequent now.  She denies chest pain nausea or vomiting.  Assessment/Plan:  DKA - uncontrolled DM2 DKA resolved - CBG now poorly controlled due to need for steroid - high risk to rebound into DKA - adjust tx again today and follow   Uncontrolled Severe Ulcerative colitis - ongoing diarrhea  Diarrhea initially thought to have been related to her immunotherapy (has now been off for ~2.5 months) - biopsy during colonoscopy 02/07/16 c/w UC and pt was started on mesalamine - has continued to have diarrhea - initiated short course of steroid which appears to be making a significant impact - will not yet consult GI for inpatient visit as the patient appears to be improving at this time  Acute renal injury  Due to prerenal azotemia due to very limited intake - resolved with volume resuscitation  Recent Labs Lab 02/27/16 0019 02/27/16 0401 02/27/16 1806 02/28/16 0239 02/29/16 0420  CREATININE 1.31* 1.11* 0.88 0.89 0.67    Possible hypothyroidism TSH elevated on last 2 checks but FT4 normal so will not start tx  Stage III Squamous cell CA of  R lung Followed by Dr. Inda Merlin who has been added as a consult - no acute questions for him  at this time   Recent L distal fibula fx s/p surgical repair Has not been able to participate w/ rehab due to weakness/general FTT - at time of discharge 02/12/16 was to be touchdown weightbearing only while wearing fracture boot - was to f/u in office w/ Dr. Sharol Given one week after d/c but was unable to keep this appointment due to illness - will ask Dr. Sharol Given to f/u w/ her during her admit   Stage 1 skin breakdown - low back/sacrum WOC Team / RN following   Code Status: FULL Family Communication: no family/friends present at time of exam today  Disposition Plan: transfer to med bed - cont steroids - follow stools and intake - clarify weight bearing status   Consultants: none  Procedures: none  Antibiotics: none  DVT prophylaxis: lovenox   Objective: Blood pressure 105/57, pulse 98, temperature 97.8 F (36.6 C), temperature source Oral, resp. rate 18, height '5\' 6"'$  (1.676 m), weight 73 kg (160 lb 15 oz), SpO2 100 %.  Intake/Output Summary (Last 24 hours) at 02/29/16 1544 Last data filed at 02/29/16 0600  Gross per 24 hour  Intake    550 ml  Output    375 ml  Net    175 ml   Exam: General: No acute respiratory distress  Lungs: Clear to auscultation bilaterally without wheezes or crackles  Cardiovascular: Regular rate and rhythm without murmur Abdomen: mildly tender diffusely, soft, bowel sounds positive, no rebound, no ascites Extremities: No significant cyanosis, clubbing, or edema bilateral lower extremities  Data Reviewed:  Basic Metabolic Panel:  Recent Labs Lab 02/26/16 1656  02/27/16 0019 02/27/16 0401 02/27/16 1806 02/28/16 0239 02/29/16 0420  NA 130*  < > 129* 131* 129* 133* 133*  K 4.4  < > 4.9 3.9 3.5 4.3 4.4  CL 102  < > 102 103 103 104 105  CO2 18*  < > 17* 18* 18* 20* 21*  GLUCOSE 202*  < > 260* 137* 138* 153* 352*  BUN 30*  < > 27* 26* '19 15 10  '$ CREATININE 1.39*  < > 1.31* 1.11* 0.88 0.89 0.67  CALCIUM 7.5*  < > 7.6* 7.8* 7.3* 7.6* 7.4*  MG 1.7  --    --   --   --   --   --   PHOS 3.6  --   --   --   --   --   --   < > = values in this interval not displayed.  CBC:  Recent Labs Lab 02/26/16 1036 02/27/16 0019 02/28/16 0239 02/29/16 0420  WBC 20.1* 17.2* 10.5 6.4  NEUTROABS 17.5*  --   --   --   HGB 16.3* 13.2 12.9 11.5*  HCT 48.5* 40.3 39.1 34.1*  MCV 88.8 87.6 89.9 88.8  PLT 355 330 307 256    Liver Function Tests:  Recent Labs Lab 02/26/16 1213 02/28/16 0239 02/29/16 0420  AST 15 16 8*  ALT 8* 8* 7*  ALKPHOS 113 78 82  BILITOT 1.4* 1.0 0.5  PROT 4.7* 3.6* 3.6*  ALBUMIN 2.4* 1.9* 1.9*    Recent Labs Lab 02/26/16 1213  LIPASE 18    CBG:  Recent Labs Lab 02/28/16 1221 02/28/16 1601 02/28/16 2204 02/29/16 0839 02/29/16 1146  GLUCAP 241* 179* 355* 307* 291*    Recent Results (from the past 240 hour(s))  Urine culture     Status: Abnormal   Collection Time: 02/26/16 11:26 AM  Result Value Ref Range Status   Specimen Description URINE, CATHETERIZED  Final   Special Requests NONE  Final   Culture 7,000 COLONIES/mL INSIGNIFICANT GROWTH (A)  Final   Report Status 02/28/2016 FINAL  Final  C difficile quick scan w PCR reflex     Status: None   Collection Time: 02/26/16  5:39 PM  Result Value Ref Range Status   C Diff antigen NEGATIVE NEGATIVE Final   C Diff toxin NEGATIVE NEGATIVE Final   C Diff interpretation Negative for toxigenic C. difficile  Final  Gastrointestinal Panel by PCR , Stool     Status: None   Collection Time: 02/26/16  5:39 PM  Result Value Ref Range Status   Campylobacter species NOT DETECTED NOT DETECTED Final   Plesimonas shigelloides NOT DETECTED NOT DETECTED Final   Salmonella species NOT DETECTED NOT DETECTED Final   Yersinia enterocolitica NOT DETECTED NOT DETECTED Final   Vibrio species NOT DETECTED NOT DETECTED Final   Vibrio cholerae NOT DETECTED NOT DETECTED Final   Enteroaggregative E coli (EAEC) NOT DETECTED NOT DETECTED Final   Enteropathogenic E coli (EPEC) NOT  DETECTED NOT DETECTED Final   Enterotoxigenic E coli (ETEC) NOT DETECTED NOT DETECTED Final   Shiga like toxin producing E coli (STEC) NOT DETECTED NOT DETECTED Final   E. coli O157 NOT DETECTED NOT DETECTED Final   Shigella/Enteroinvasive E coli (EIEC) NOT DETECTED NOT DETECTED Final   Cryptosporidium NOT DETECTED NOT DETECTED Final   Cyclospora cayetanensis NOT DETECTED NOT DETECTED Final   Entamoeba histolytica NOT DETECTED NOT DETECTED Final   Giardia lamblia  NOT DETECTED NOT DETECTED Final   Adenovirus F40/41 NOT DETECTED NOT DETECTED Final   Astrovirus NOT DETECTED NOT DETECTED Final   Norovirus GI/GII NOT DETECTED NOT DETECTED Final   Rotavirus A NOT DETECTED NOT DETECTED Final   Sapovirus (I, II, IV, and V) NOT DETECTED NOT DETECTED Final  MRSA PCR Screening     Status: None   Collection Time: 02/26/16  7:15 PM  Result Value Ref Range Status   MRSA by PCR NEGATIVE NEGATIVE Final    Comment:        The GeneXpert MRSA Assay (FDA approved for NASAL specimens only), is one component of a comprehensive MRSA colonization surveillance program. It is not intended to diagnose MRSA infection nor to guide or monitor treatment for MRSA infections.      Studies:   Recent x-ray studies have been reviewed in detail by the Attending Physician  Scheduled Meds:  Scheduled Meds: . collagenase   Topical Daily  . darifenacin  7.5 mg Oral Daily  . enoxaparin (LOVENOX) injection  30 mg Subcutaneous Q24H  . gabapentin  300 mg Oral BID  . insulin aspart  0-15 Units Subcutaneous TID WC  . insulin aspart  0-5 Units Subcutaneous QHS  . insulin glargine  14 Units Subcutaneous QHS  . mesalamine  2,400 mg Oral BID  . methylPREDNISolone (SOLU-MEDROL) injection  40 mg Intravenous Q8H  . mometasone-formoterol  2 puff Inhalation BID  . zinc oxide   Topical BID    Time spent on care of this patient: 35 mins   MCCLUNG,JEFFREY T , MD   [facility] Office  [phone] Pager - Text  Page per Shea Evans as per below:  On-Call/Text Page:      [email or web address]      password [email or web address]  If 7PM-7AM, please contact night-coverage [email or web address] Password [email or web address] 02/29/2016, 3:44 PM   LOS: 3 days

## 2016-02-29 NOTE — Progress Notes (Signed)
Initial Nutrition Assessment  DOCUMENTATION CODES:   Non-severe (moderate) malnutrition in context of chronic illness  INTERVENTION:   No nutrition intervention at this time --- patient declined  NUTRITION DIAGNOSIS:   Malnutrition related to chronic illness as evidenced by moderate depletion of body fat, moderate depletions of muscle mass  GOAL:   Patient will meet greater than or equal to 90% of their needs  MONITOR:   PO intake, Labs, Weight trends, Skin, I & O's  REASON FOR ASSESSMENT:   Consult Poor PO  ASSESSMENT:   75 y.o. Female with a history of malignant neoplasm RUL, RA, persisting diarrhea w/ diffuse colitis since early April 2017, and DM w/ diabetic ketoacidosis/coma who presented to the ED with hyperglycemia. Evaluation in the emergency room revealed DKA.  Patient reports a poor appetite for 5 days PTA. PO intake very poor at 10-20% per flowsheet records. She's unsure if she's lost weight or not. Pt does not care for Ensure or any other supplements offered.  Nutrition-Focused physical exam completed. Findings are moderate fat depletion, moderate muscle depletion, and no edema.   Diet Order:  Diet regular Room service appropriate?: Yes; Fluid consistency:: Thin  Skin:  Wound (see comment) (Unstageable injury to sacrum)  Last BM:  5/1  Height:   Ht Readings from Last 1 Encounters:  02/27/16 '5\' 6"'$  (1.676 m)    Weight:   Wt Readings from Last 1 Encounters:  02/27/16 160 lb 15 oz (73 kg)     Ideal Body Weight:  59 kg  BMI:  Body mass index is 25.99 kg/(m^2).  Estimated Nutritional Needs:   Kcal:  1550-1750  Protein:  75-90 gm  Fluid:  1.5-1.7 L  EDUCATION NEEDS:   No education needs identified at this time  Arthur Holms, RD, LDN Pager #: [phone] After-Hours Pager #: [phone]

## 2016-02-29 NOTE — Progress Notes (Addendum)
Inpatient Diabetes Program Recommendations  AACE/ADA: New Consensus Statement on Inpatient Glycemic Control (2015)  Target Ranges:  Prepandial:   less than 140 mg/dL      Peak postprandial:   less than 180 mg/dL (1-2 hours)      Critically ill patients:  140 - 180 mg/dL  Results for Megan [name], Megan [name] (MRN [id number]) as of 02/29/2016 09:43  Ref. Range 02/28/2016 08:24 02/28/2016 12:21 02/28/2016 16:01 02/28/2016 22:04 02/29/2016 08:39  Glucose-Capillary Latest Ref Range: 65-99 mg/dL 177 (H) 241 (H) 179 (H) 355 (H) 307 (H)   Review of Glycemic Control  Diabetes history: DM2 Outpatient Diabetes medications: Levemir 24 units QHS, Novolog 0-15 units TID with meals Current orders for Inpatient glycemic control: Lantus 14 units QHS, Novolog 0-15 units TID with meals, Novolog 0-5 units QHS  Inpatient Diabetes Program Recommendations: Insulin - Basal: Please consider increasing Lantus to 20 units QHS.  Insulin - Meal Coverage: If steroids are continued as ordered, please consider ordering Novolog 3 units TID with meals for meal coverage.  Insulin-Correction: Please consider ordering Novolog bedtime correction scale.  Thanks, Barnie Alderman, RN, MSN, CDE Diabetes Coordinator Inpatient Diabetes Program [phone] (Team Pager from [place] to [place]) [phone] ([place] office) [phone] [facility] office) [phone] [facility] office)

## 2016-02-29 NOTE — Care Management Important Message (Signed)
Important Message  Patient Details  Name: Megan [name] MRN: [id number] Date of Birth: 1941/05/03   Medicare Important Message Given:  Yes    Nathen May 02/29/2016, 3:53 PM

## 2016-03-01 LAB — BASIC METABOLIC PANEL
ANION GAP: 9 (ref 5–15)
BUN: 12 mg/dL (ref 6–20)
BUN: 12 mg/dL (ref 4–21)
CHLORIDE: 105 mmol/L (ref 101–111)
CO2: 22 mmol/L (ref 22–32)
Calcium: 7.6 mg/dL — ABNORMAL LOW (ref 8.9–10.3)
Creatinine, Ser: 0.61 mg/dL (ref 0.44–1.00)
Creatinine: 0.6 mg/dL (ref 0.5–1.1)
GFR calc non Af Amer: >60 mL/min (ref >60)
Glucose, Bld: 248 mg/dL — ABNORMAL HIGH (ref 65–99)
Glucose: 248 mg/dL
POTASSIUM: 4.1 mmol/L (ref 3.5–5.1)
SODIUM: 136 mmol/L — AB (ref 137–147)
SODIUM: 136 mmol/L (ref 135–145)

## 2016-03-01 LAB — CBC
HEMATOCRIT: 33.8 % — AB (ref 36.0–46.0)
HEMOGLOBIN: 11.1 g/dL — AB (ref 12.0–15.0)
MCH: 29.6 pg (ref 26.0–34.0)
MCHC: 32.8 g/dL (ref 30.0–36.0)
MCV: 90.1 fL (ref 78.0–100.0)
Platelets: 275 10*3/uL (ref 150–400)
RBC: 3.75 MIL/uL — AB (ref 3.87–5.11)
RDW: 16.2 % — ABNORMAL HIGH (ref 11.5–15.5)
WBC: 7.5 10*3/uL (ref 4.0–10.5)

## 2016-03-01 LAB — CBC AND DIFFERENTIAL: WBC: 7.5 10^3/mL

## 2016-03-01 LAB — GLUCOSE, CAPILLARY
GLUCOSE-CAPILLARY: 342 mg/dL — AB (ref 65–99)
GLUCOSE-CAPILLARY: 202 mg/dL — AB (ref 65–99)
GLUCOSE-CAPILLARY: 234 mg/dL — AB (ref 65–99)
Glucose-Capillary: 268 mg/dL — ABNORMAL HIGH (ref 65–99)
Glucose-Capillary: 218 mg/dL — ABNORMAL HIGH (ref 65–99)

## 2016-03-01 MED ORDER — PREDNISONE 50 MG PO TABS
60.0000 mg | ORAL_TABLET | Freq: Every day | ORAL | Status: DC
  Administered 2016-03-01: 60 mg via ORAL
  Administered 2016-03-02: 10 mg via ORAL
  Filled 2016-03-01 (×2): qty 1

## 2016-03-01 MED ORDER — INSULIN GLARGINE 100 UNIT/ML ~~LOC~~ SOLN
24.0000 [IU] | Freq: Every day | SUBCUTANEOUS | Status: DC
  Administered 2016-03-01: 24 [IU] via SUBCUTANEOUS
  Filled 2016-03-01 (×2): qty 0.24

## 2016-03-01 MED ORDER — INSULIN ASPART 100 UNIT/ML ~~LOC~~ SOLN
5.0000 [IU] | Freq: Three times a day (TID) | SUBCUTANEOUS | Status: DC
  Administered 2016-03-02: 5 [IU] via SUBCUTANEOUS
  Administered 2016-03-02: 9 [IU] via SUBCUTANEOUS

## 2016-03-01 MED ORDER — INSULIN ASPART 100 UNIT/ML ~~LOC~~ SOLN
0.0000 [IU] | Freq: Three times a day (TID) | SUBCUTANEOUS | Status: DC
  Administered 2016-03-02: 9 [IU] via SUBCUTANEOUS

## 2016-03-01 NOTE — Progress Notes (Addendum)
Inpatient Diabetes Program Recommendations  AACE/ADA: New Consensus Statement on Inpatient Glycemic Control (2015)  Target Ranges:  Prepandial:   less than 140 mg/dL      Peak postprandial:   less than 180 mg/dL (1-2 hours)      Critically ill patients:  140 - 180 mg/dL   Review of Glycemic Control Results for MYLEI, BRACKEEN (MRN [id number]) as of 03/01/2016 10:44  Ref. Range 02/29/2016 08:39 02/29/2016 11:46 02/29/2016 15:44 02/29/2016 22:43 03/01/2016 07:55  Glucose-Capillary Latest Ref Range: 65-99 mg/dL 307 (H) 291 (H) 342 (H) 268 (H) 218 (H)   Diabetes history: DM2 Outpatient Diabetes medications: Levemir 24 units QHS, Novolog 0-15 units TID with meals Current orders for Inpatient glycemic control: Lantus 20 units QHS, 4 units tid, Novolog meal coverage Novolog 0-15 units TID with meals, Novolog 0-5 units QHS  Inpatient Diabetes Program Recommendations: Insulin - Basal: Please consider increasing Lantus to 23 units QHS.  Insulin - Meal Coverage: If steroids are continued as ordered, please consider increasing Novolog 6 units TID with meals for meal coverage. Also consider changing diet order to carb modified.  Thank you, Nani Gasser. Clotee Schlicker, RN, MSN, CDE Inpatient Glycemic Control Team Team Pager [phone] (8am-5pm) 03/01/2016 10:44 AM

## 2016-03-01 NOTE — NC FL2 (Signed)
[place] MEDICAID FL2 LEVEL OF CARE SCREENING TOOL     IDENTIFICATION  Patient Name: Megan [name] Birthdate: [birth year] Sex: female Admission Date (Current Location): 02/26/2016  [facility] and [place] Number:  Herbalist and Address:  [facility] [place], [place] [address], [place], [place] [address]      Provider Number: [id number]  Attending Physician Name and Address:  Hosie Poisson, MD  Relative Name and Phone Number:  Gladys Damme Significant other [phone]    Current Level of Care: Hospital Recommended Level of Care: [place] Prior Approval Number:    Date Approved/Denied:   PASRR Number: [id number]  Discharge Plan: SNF    Current Diagnoses: Patient Active Problem List   Diagnosis Date Noted  . Malnutrition of moderate degree 02/29/2016  . Skin breakdown 02/26/2016  . Hyponatremia 02/26/2016  . Lactic acidosis 02/26/2016  . Pressure ulcer 02/26/2016  . DKA (diabetic ketoacidoses) ([place]) 02/17/2016  . Leukocytosis 02/17/2016  . Acute kidney injury (nontraumatic) ([place])   . Diabetic ketoacidosis with coma associated with diabetes mellitus due to underlying condition ([place])   . AKI (acute kidney injury) ([place]) 02/05/2016  . Abdominal pain   . Abnormal finding on GI tract imaging   . Diarrhea   . Hematochezia   . Acute kidney injury ([place]) 02/04/2016  . Acute colitis 02/04/2016  . DM type 2 (diabetes mellitus, type 2) ([place]) 02/04/2016  . RLS (restless legs syndrome) 12/11/2015  . Diarrhea due to drug 08/11/2015  . Liver dysfunction 08/11/2015  . Dysuria 07/07/2015  . Lumbago 05/06/2015  . Acute bronchitis 05/06/2015  . Hot flashes 04/14/2015  . Peripheral edema 04/14/2015  . Hypokalemia 04/14/2015  . Rheumatoid arthritis ([place]) 04/14/2015  . Encounter for antineoplastic immunotherapy 04/06/2015  . Emphysema of lung ([place]) 11/26/2014  . Hyperglycemia due to type 2 diabetes mellitus ([place]) 11/10/2014  .  Radiation-induced esophageal stricture 10/01/2014  . Tachycardia 08/27/2014  . Depression 08/27/2014  . Palliative care encounter 08/21/2014  . DNR (do not resuscitate) discussion 08/21/2014  . Neuropathic pain 08/21/2014  . Cancer associated pain 07/29/2014  . Malignant cachexia ([place]) 07/29/2014  . Acute renal insufficiency 07/29/2014  . Lung collapse 07/29/2014  . Chemotherapy-induced neuropathy ([place]) 07/29/2014  . Fatigue 07/18/2014  . Neuropathy due to chemotherapeutic drug ([place]) 07/18/2014  . Fever 07/18/2014  . Dehydration 07/18/2014  . Leg cramps 07/18/2014  . Hypoalbuminemia 07/18/2014  . Thrombocytopenia, unspecified ([place]) 07/18/2014  . Hypotension 07/18/2014  . Dyspnea 04/28/2014  . Radiation esophagitis 04/10/2014  . Right-sided chest wall pain 04/01/2014  . Cancer of middle lobe of lung ([place]) 01/17/2014  . Malignant neoplasm of right upper lobe of lung ([place]) 11/28/2013  . Lung mass 11/09/2013  . Need for prophylactic vaccination and inoculation against influenza 11/09/2013    Orientation RESPIRATION BLADDER Height & Weight     Self, Time, Situation, Place  Normal Continent Weight: 160 lb 15 oz (73 kg) Height:  '5\' 6"'$  (167.6 cm)  BEHAVIORAL SYMPTOMS/MOOD NEUROLOGICAL BOWEL NUTRITION STATUS      Continent Diet  AMBULATORY STATUS COMMUNICATION OF NEEDS Skin   Limited Assist Verbally Surgical wounds                       Personal Care Assistance Level of Assistance  Bathing, Dressing Bathing Assistance: Limited assistance Feeding assistance: Independent Dressing Assistance: Limited assistance     Functional Limitations Info    Sight Info: Adequate Hearing Info: Adequate Speech Info: Adequate  SPECIAL CARE FACTORS FREQUENCY  PT (By licensed PT), OT (By licensed OT)     PT Frequency: 5/wk OT Frequency: 5/wk            Contractures      Additional Factors Info  Code Status, Allergies, Insulin Sliding Scale Code Status Info: FULL Allergies  Info: Carboplatin, Remicade, Hydromorphone   Insulin Sliding Scale Info: 5/day       Current Medications (03/01/2016):  This is the current hospital active medication list Current Facility-Administered Medications  Medication Dose Route Frequency Provider Last Rate Last Dose  . collagenase (SANTYL) ointment   Topical Daily Edwin Dada, MD      . darifenacin (ENABLEX) 24 hr tablet 7.5 mg  7.5 mg Oral Daily Lezlie Octave Black, NP   7.5 mg at 03/01/16 0851  . enoxaparin (LOVENOX) injection 30 mg  30 mg Subcutaneous Q24H Radene Gunning, NP   30 mg at 02/29/16 2312  . gabapentin (NEURONTIN) capsule 300 mg  300 mg Oral BID Radene Gunning, NP   300 mg at 03/01/16 0850  . insulin aspart (novoLOG) injection 0-15 Units  0-15 Units Subcutaneous TID WC Cherene Altes, MD   5 Units at 03/01/16 0848  . insulin aspart (novoLOG) injection 0-5 Units  0-5 Units Subcutaneous QHS Cherene Altes, MD   3 Units at 02/29/16 2348  . insulin aspart (novoLOG) injection 4 Units  4 Units Subcutaneous TID WC Cherene Altes, MD   4 Units at 03/01/16 0848  . insulin glargine (LANTUS) injection 20 Units  20 Units Subcutaneous QHS Cherene Altes, MD   20 Units at 02/29/16 2348  . ipratropium-albuterol (DUONEB) 0.5-2.5 (3) MG/3ML nebulizer solution 3 mL  3 mL Nebulization Q4H PRN Radene Gunning, NP      . mesalamine (LIALDA) EC tablet 2.4 g  2,400 mg Oral BID Radene Gunning, NP   2.4 g at 02/26/16 2148  . mometasone-formoterol (DULERA) 100-5 MCG/ACT inhaler 2 puff  2 puff Inhalation BID Radene Gunning, NP   2 puff at 02/29/16 1949  . predniSONE (DELTASONE) tablet 60 mg  60 mg Oral QAC breakfast Hosie Poisson, MD      . zinc oxide (BALMEX) 11.3 % cream   Topical BID Sherwood Gambler, MD       Facility-Administered Medications Ordered in Other Encounters  Medication Dose Route Frequency Provider Last Rate Last Dose  . sodium chloride 0.9 % injection 10 mL  10 mL Intracatheter PRN Curt Bears, MD   10 mL at 12/30/13  1121     Discharge Medications: Please see discharge summary for a list of discharge medications.  Relevant Imaging Results:  Relevant Lab Results:   Additional Information [id number]  [place]ranford Mon, [place]

## 2016-03-01 NOTE — Clinical Documentation Improvement (Addendum)
Hospitalists and/or Associates  Please document query responses in the progress notes and discharge summary, not on the CDI BPA form.  Thank you.  The current documentation in the medical record is: "Stage I skin breakdown low back and sacral area" documented in the H&P.  The [facility] nurse consult has been completed and is noted below.  If you agree with the [place] nurse's assessment, please update the documentation in the current medical record.   [facility] nurse consult by Julien Girt RN on 02/26/16 at 1:06 pm with the following information: "Wound type: Bilat buttocks affected are 20X12cm, and inner groin are red and macerated with patchy areas of partial thickness skin loss; appearance consistent with moisture associated skin damage related to incontinence  Sacrum area in inner gluteal fold with unstageable pressure injury; 2X.1cm, 100% yellow slough, painful to touch, no odor, scant amt yellow drainage.  Pressure Ulcer POA: Yes  Dressing procedure/placement/frequency: Air mattress to reduce pressure and increase airflow to affected areas, barrier cream to protect and repel moisture to buttocks and groin, Santyl ointment to provide enzymatic debridement of nonviable tissue to sacrum/inner gluteal fold."    Please exercise your independent, professional judgment when responding. A specific answer is not anticipated or expected.   Thank You, Erling Conte  RN BSN CCDS [phone] Health Information Management [place]

## 2016-03-01 NOTE — Care Management Note (Signed)
Case Management Note  Patient Details  Name: Megan [name] MRN: [id number] Date of Birth: Nov 04, 1940  Subjective/Objective:                 Spoke with patient in the room along with [place] CSW. Patient dc'd to [facility] with last DC of ORIF repair of ankle. Was home for a few days prior to readmission for colitis. She states that she does not want to go back to [facility], she would like to go home first, with home health and see how it goes. CSW and CM suggested she work with PT again for a more thorough eval. Patient stated she may like to llow up with [facility] if she decides on SNF after [place]. [facility] referred to [facility] [place], however Dr. Antionette Poles who is currently treating patient would like to continue to be her primary. Unsure if patient aware she had H Hospice referral, as she did not mention it when we spoke to her. [place] not specified.    Action/Plan:  Will need HH SW, along with PT to help facilitate with referral to SNF as outpatient if needed.  Expected Discharge Date:                  Expected Discharge Plan:  [place]  In-House Referral:     Discharge planning Services  CM Consult  Post Acute Care Choice:  Home Health Choice offered to:     DME Arranged:    DME Agency:     HH Arranged:    [place] Agency:     Status of Service:  In process, will continue to follow  Medicare Important Message Given:  Yes Date Medicare IM Given:    Medicare IM give by:    Date Additional Medicare IM Given:    Additional Medicare Important Message give by:     If discussed at [place] of Stay Meetings, dates discussed:    Additional Comments:  Carles Collet, RN 03/01/2016, 11:19 AM

## 2016-03-01 NOTE — Progress Notes (Signed)
Patient ID: Megan [name], female   DOB: 21-Sep-1941, 75 y.o.   MRN: [id number] Patient is status post open reduction internal fixation left ankle Weber B fibular fracture. The incision is healing nicely. Orders written for dry dressing change. Orders are written for weightbearing as tolerated with physical therapy with her fracture boot in place. Patient is to only weight-bear with a fracture boot in place. I will follow-up in the office.

## 2016-03-01 NOTE — Progress Notes (Signed)
PROGRESS NOTE  Megan [name] SJG:[id number] DOB: 1941/04/18 DOA: 02/26/2016 PCP: [name], MD  Admit HPI / Brief Narrative: 75 y.o. female with a history of malignant neoplasm RUL, RA, persisting diarrhea w/ diffuse colitis since early April 2017, and DM w/ diabetic ketoacidosis/coma who presented to the ED with hyperglycemia. Evaluation in the emergency room revealed DKA.     HPI/Subjective: Diarrhea improved.  She denies chest pain nausea or vomiting.  Assessment/Plan:  DKA - uncontrolled DM2 DKA resolved - CBG now poorly controlled due to need for steroid - high risk to rebound into DKA - adjust tx again today and follow  CBG (last 3)   Recent Labs  02/29/16 2243 03/01/16 0755 03/01/16 1227  GLUCAP 268* 218* 342*      Uncontrolled Severe Ulcerative colitis - ongoing diarrhea  Diarrhea initially thought to have been related to her immunotherapy (has now been off for ~2.5 months) - biopsy during colonoscopy 02/07/16 c/w UC and pt was started on mesalamine - has continued to have diarrhea - initiated short course of steroid which appears to be making a significant impact - will not yet consult GI for inpatient visit as the patient appears to be improving at this time Change to po steroids, plan for quick taper.   Acute renal injury  Due to prerenal azotemia due to very limited intake - resolved with volume resuscitation  Recent Labs Lab 02/27/16 0401 02/27/16 1806 02/28/16 0239 02/29/16 0420 03/01/16 0659  CREATININE 1.11* 0.88 0.89 0.67 0.61    Possible hypothyroidism TSH elevated on last 2 checks but FT4 normal so will not start tx  Stage III Squamous cell CA of  R lung Followed by Dr. Inda Merlin who has been added as a consult - no acute questions for him at this time   Recent L distal fibula fx s/p surgical repair Has not been able to participate w/ rehab due to weakness/general FTT - at time of discharge 02/12/16 was to be touchdown weightbearing  only while wearing fracture boot - was to f/u in office w/ Dr. Sharol Given one week after d/c but was unable to keep this appointment due to illness -  Incision healing good. Weight brearing as tolerated with her fracture boot in place.   Stage 1 skin breakdown - low back/sacrum WOC Team / RN following   Code Status: FULL Family Communication: no family/friends present at time of exam today  Disposition Plan: change to carb modified diet, plan for SNF IN AM.   Consultants: Dr Sharol Given orthopedics.  Procedures: none  Antibiotics: none  DVT prophylaxis: lovenox   Objective: Blood pressure 112/64, pulse 105, temperature 98.1 F (36.7 C), temperature source Oral, resp. rate 16, height '5\' 6"'$  (1.676 m), weight 73 kg (160 lb 15 oz), SpO2 100 %.  Intake/Output Summary (Last 24 hours) at 03/01/16 1740 Last data filed at 03/01/16 0658  Gross per 24 hour  Intake 371.67 ml  Output    450 ml  Net -78.33 ml   Exam: General: No acute respiratory distress  Lungs: Clear to auscultation bilaterally without wheezes or crackles  Cardiovascular: Regular rate and rhythm without murmur Abdomen: mildly tender diffusely, soft, bowel sounds positive, no rebound, no ascites Extremities: No significant cyanosis, clubbing, or edema bilateral lower extremities  Data Reviewed:  Basic Metabolic Panel:  Recent Labs Lab 02/26/16 1656  02/27/16 0401 02/27/16 1806 02/28/16 0239 02/29/16 0420 03/01/16 0659  NA 130*  < > 131* 129* 133* 133* 136  K 4.4  < >  3.9 3.5 4.3 4.4 4.1  CL 102  < > 103 103 104 105 105  CO2 18*  < > 18* 18* 20* 21* 22  GLUCOSE 202*  < > 137* 138* 153* 352* 248*  BUN 30*  < > 26* '19 15 10 12  '$ CREATININE 1.39*  < > 1.11* 0.88 0.89 0.67 0.61  CALCIUM 7.5*  < > 7.8* 7.3* 7.6* 7.4* 7.6*  MG 1.7  --   --   --   --   --   --   PHOS 3.6  --   --   --   --   --   --   < > = values in this interval not displayed.  CBC:  Recent Labs Lab 02/26/16 1036 02/27/16 0019 02/28/16 0239  02/29/16 0420 03/01/16 0659  WBC 20.1* 17.2* 10.5 6.4 7.5  NEUTROABS 17.5*  --   --   --   --   HGB 16.3* 13.2 12.9 11.5* 11.1*  HCT 48.5* 40.3 39.1 34.1* 33.8*  MCV 88.8 87.6 89.9 88.8 90.1  PLT 355 330 307 256 275    Liver Function Tests:  Recent Labs Lab 02/26/16 1213 02/28/16 0239 02/29/16 0420  AST 15 16 8*  ALT 8* 8* 7*  ALKPHOS 113 78 82  BILITOT 1.4* 1.0 0.5  PROT 4.7* 3.6* 3.6*  ALBUMIN 2.4* 1.9* 1.9*    Recent Labs Lab 02/26/16 1213  LIPASE 18    CBG:  Recent Labs Lab 02/29/16 1146 02/29/16 1544 02/29/16 2243 03/01/16 0755 03/01/16 1227  GLUCAP 291* 342* 268* 218* 342*    Recent Results (from the past 240 hour(s))  Urine culture     Status: Abnormal   Collection Time: 02/26/16 11:26 AM  Result Value Ref Range Status   Specimen Description URINE, CATHETERIZED  Final   Special Requests NONE  Final   Culture 7,000 COLONIES/mL INSIGNIFICANT GROWTH (A)  Final   Report Status 02/28/2016 FINAL  Final  C difficile quick scan w PCR reflex     Status: None   Collection Time: 02/26/16  5:39 PM  Result Value Ref Range Status   C Diff antigen NEGATIVE NEGATIVE Final   C Diff toxin NEGATIVE NEGATIVE Final   C Diff interpretation Negative for toxigenic C. difficile  Final  Gastrointestinal Panel by PCR , Stool     Status: None   Collection Time: 02/26/16  5:39 PM  Result Value Ref Range Status   Campylobacter species NOT DETECTED NOT DETECTED Final   Plesimonas shigelloides NOT DETECTED NOT DETECTED Final   Salmonella species NOT DETECTED NOT DETECTED Final   Yersinia enterocolitica NOT DETECTED NOT DETECTED Final   Vibrio species NOT DETECTED NOT DETECTED Final   Vibrio cholerae NOT DETECTED NOT DETECTED Final   Enteroaggregative E coli (EAEC) NOT DETECTED NOT DETECTED Final   Enteropathogenic E coli (EPEC) NOT DETECTED NOT DETECTED Final   Enterotoxigenic E coli (ETEC) NOT DETECTED NOT DETECTED Final   Shiga like toxin producing E coli (STEC) NOT  DETECTED NOT DETECTED Final   E. coli O157 NOT DETECTED NOT DETECTED Final   Shigella/Enteroinvasive E coli (EIEC) NOT DETECTED NOT DETECTED Final   Cryptosporidium NOT DETECTED NOT DETECTED Final   Cyclospora cayetanensis NOT DETECTED NOT DETECTED Final   Entamoeba histolytica NOT DETECTED NOT DETECTED Final   Giardia lamblia NOT DETECTED NOT DETECTED Final   Adenovirus F40/41 NOT DETECTED NOT DETECTED Final   Astrovirus NOT DETECTED NOT DETECTED Final   Norovirus GI/GII NOT  DETECTED NOT DETECTED Final   Rotavirus A NOT DETECTED NOT DETECTED Final   Sapovirus (I, II, IV, and V) NOT DETECTED NOT DETECTED Final  MRSA PCR Screening     Status: None   Collection Time: 02/26/16  7:15 PM  Result Value Ref Range Status   MRSA by PCR NEGATIVE NEGATIVE Final    Comment:        The GeneXpert MRSA Assay (FDA approved for NASAL specimens only), is one component of a comprehensive MRSA colonization surveillance program. It is not intended to diagnose MRSA infection nor to guide or monitor treatment for MRSA infections.      Studies:   Recent x-ray studies have been reviewed in detail by the Attending Physician  Scheduled Meds:  Scheduled Meds: . collagenase   Topical Daily  . darifenacin  7.5 mg Oral Daily  . enoxaparin (LOVENOX) injection  30 mg Subcutaneous Q24H  . gabapentin  300 mg Oral BID  . insulin aspart  0-15 Units Subcutaneous TID WC  . insulin aspart  0-5 Units Subcutaneous QHS  . insulin aspart  4 Units Subcutaneous TID WC  . insulin glargine  20 Units Subcutaneous QHS  . mesalamine  2,400 mg Oral BID  . mometasone-formoterol  2 puff Inhalation BID  . predniSONE  60 mg Oral QAC breakfast  . zinc oxide   Topical BID    Time spent on care of this patient: 3 mins   Japneet Staggs , MD   [facility] Office  [phone] Pager - Text Page per Shea Evans as per below:  On-Call/Text Page:      [email or web address]      password [email or web address]  If 7PM-7AM, please contact  night-coverage [email or web address] Password [email or web address] 03/01/2016, 5:40 PM   LOS: 4 days

## 2016-03-01 NOTE — Evaluation (Signed)
Occupational Therapy Evaluation Patient Details Name: Megan [name] MRN: [id number] DOB: Jun 19, 1941 Today's Date: 03/01/2016    History of Present Illness 75 y.o. female with a history of malignant neoplasm RUL, RA, persisting diarrhea w/ diffuse colitis since early April 2017, and DM w/ diabetic ketoacidosis/coma who presented to the ED with hyperglycemia. Evaluation in the emergency room revealed DKA.  Pt has been recovering from left tib fib fracture as weell. Of note, pt was discharged from a SNF to home 2 days prior to admit.   Clinical Impression   Pt with hx of falls and fractures with poor activity tolerance due to complex medical hx. Pt presents with generalized weakness, poor endurance and impaired balance interfering with ability to perform mobility and ADL/IADL independently. Pt is agreeable to SNF for ST rehab. Her goal is to go on a bus trip with her friends next week. Pt became angry with her significant other when he asked this therapist about the referral pt has for hospice. Agree with plan for SNF as pt is not safe to manage at home alone.    Follow Up Recommendations  SNF;Supervision/Assistance - 24 hour    Equipment Recommendations       Recommendations for Other Services       Precautions / Restrictions Precautions Precautions: Fall Required Braces or Orthoses: Other Brace/Splint Other Brace/Splint: CAM boot on when up Restrictions Weight Bearing Restrictions: Yes LLE Weight Bearing: Weight bearing as tolerated Other Position/Activity Restrictions: Dr. Sharol Given clarified that pt is WBAT in boot only.       Mobility Bed Mobility Overal bed mobility: Needs Assistance Bed Mobility: Supine to Sit;Sit to Supine     Supine to sit: Supervision Sit to supine: Supervision   General bed mobility comments: No assist required. Supervision provided for safety.   Transfers Overall transfer level: Needs assistance Equipment used: Rolling walker (2  wheeled) Transfers: Sit to/from Stand Sit to Stand: Min guard         General transfer comment: guarding for safety, verbal cues for hand placement on walker    Balance Overall balance assessment: Needs assistance;History of Falls Sitting-balance support: Feet supported;No upper extremity supported Sitting balance-Leahy Scale: Fair     Standing balance support: Bilateral upper extremity supported;During functional activity Standing balance-Leahy Scale: Poor Standing balance comment: UE support required for balance                            ADL Overall ADL's : Needs assistance/impaired Eating/Feeding: Independent;Sitting   Grooming: Wash/dry hands;Standing;Min guard   Upper Body Bathing: Set up;Sitting   Lower Body Bathing: Minimal assistance;Sit to/from stand   Upper Body Dressing : Set up;Sitting   Lower Body Dressing: Minimal assistance;Sit to/from stand   Toilet Transfer: Minimal assistance;Ambulation;RW   Toileting- Clothing Manipulation and Hygiene: Minimal assistance;Sit to/from stand         General ADL Comments: Pt with decreased activity tolerance.     Vision     Perception     Praxis      Pertinent Vitals/Pain Pain Assessment: Faces Pain Score: 0-No pain Faces Pain Scale: Hurts little more Pain Location: LLE Pain Descriptors / Indicators: Operative site guarding Pain Intervention(s): Limited activity within patient's tolerance;Monitored during session;Repositioned     Hand Dominance Right   Extremity/Trunk Assessment Upper Extremity Assessment Upper Extremity Assessment: Generalized weakness   Lower Extremity Assessment Lower Extremity Assessment: Defer to PT evaluation   Cervical / Trunk Assessment Cervical /  Trunk Assessment:  (hx of chronic back pain)   Communication Communication Communication: HOH   Cognition Arousal/Alertness: Awake/alert Behavior During Therapy: WFL for tasks assessed/performed Overall Cognitive  Status: Impaired/Different from baseline Area of Impairment: Safety/judgement         Safety/Judgement: Decreased awareness of safety;Decreased awareness of deficits     General Comments: Pt adamant that she will be ready to go on a bus trip with her friends next Tuesday.   General Comments       Exercises       Shoulder Instructions      Home Living Family/patient expects to be discharged to:: Private residence Living Arrangements: Alone Available Help at Discharge: Friend(s);Available PRN/intermittently Type of Home: House Home Access: Stairs to enter [employer] of Steps: 1   Home Layout: One level         Biochemist, clinical: Standard     Home Equipment: Environmental consultant - 4 wheels;Walker - standard;Shower seat;Walker - 2 wheels;Crutches   Additional Comments: pt had been at [employer] since D/C on 02/12/16.      Prior Functioning/Environment Level of Independence: Needs assistance  Gait / Transfers Assistance Needed: Was limited at SNF per pt to transfers mostly due to poor tolerance of activity.   ADL's / Homemaking Assistance Needed: pt states she could do sponge bath but struggles to dress        OT Diagnosis: Generalized weakness;Cognitive deficits   OT Problem List:     OT Treatment/Interventions:      OT Goals(Current goals can be found in the care plan section) Acute Rehab OT Goals Patient Stated Goal: go on bus trip next week with friends  OT Frequency:     Barriers to D/C:            Co-evaluation              End of Session Equipment Utilized During Treatment: Gait belt;Rolling walker (CAM boot)  Activity Tolerance: Patient tolerated treatment well Patient left: in bed;with call bell/phone within reach;with bed alarm set;with family/visitor present   Time: 6629-4765 OT Time Calculation (min): 24 min Charges:  OT General Charges $OT Visit: 1 Procedure OT Evaluation $OT Eval Moderate Complexity: 1 Procedure OT Treatments $Self  Care/Home Management : 8-22 mins G-Codes:    Malka So 03/01/2016, 3:13 PM  [phone]

## 2016-03-01 NOTE — Progress Notes (Signed)
Physical Therapy Treatment Patient Details Name: Megan [name] MRN: [id number] DOB: [birth year] Today's Date: 03/01/2016    History of Present Illness 75 y.o. female with a history of malignant neoplasm RUL, RA, persisting diarrhea w/ diffuse colitis since early April 2017, and DM w/ diabetic ketoacidosis/coma who presented to the ED with hyperglycemia. Evaluation in the emergency room revealed DKA.  Pt has been recovering from left tib fib fracture as weell. Of note, pt was discharged from a SNF to home 2 days prior to admit.    PT Comments    Pt progressing towards physical therapy goals. CAM boot present this session and pt was able to don/doff boot with min assist. Pt was able to progress gait training with RW for support. Overall tolerance for functional activity remains low and pt is concerned with her abilities to manage at home without consistent reliable assist from family/friends. Pt is interested in SNF for short term rehab and I feel this is an appropriate d/c disposition as I do not feel the pt could manage IADL's at home alone such as cooking. Will continue to follow and progress as able per POC.   Follow Up Recommendations  SNF     Equipment Recommendations  None recommended by PT    Recommendations for Other Services       Precautions / Restrictions Precautions Precautions: Fall Required Braces or Orthoses: Other Brace/Splint Other Brace/Splint: CAM boot on when up Restrictions Weight Bearing Restrictions: Yes LLE Weight Bearing: Weight bearing as tolerated Other Position/Activity Restrictions: Dr. Sharol Given clarified that pt is WBAT in boot only.     Mobility  Bed Mobility Overal bed mobility: Needs Assistance Bed Mobility: Supine to Sit;Sit to Supine     Supine to sit: Supervision Sit to supine: Supervision   General bed mobility comments: No assist required. Supervision provided for safety.   Transfers Overall transfer level: Needs assistance Equipment  used: Rolling walker (2 wheeled) Transfers: Sit to/from Stand Sit to Stand: Min guard         General transfer comment: Hands-on guarding and VC's for hand placement on seated surface for safety as pt powered-up to full standing position. Light guard as pt controlled descent back to bed.   Ambulation/Gait Ambulation/Gait assistance: Min guard Ambulation Distance (Feet): 200 Feet Assistive device: Rolling walker (2 wheeled) Gait Pattern/deviations: Step-through pattern;Decreased stride length;Trunk flexed Gait velocity: Decreased Gait velocity interpretation: Below normal speed for age/gender General Gait Details: VC's for sequencing and technique with the RW. Pt was eventually able to achieve a smoother gait pattern. Pt appears fatigued at end of gait training however reports only minimal fatigue.    Stairs            Wheelchair Mobility    Modified Rankin (Stroke Patients Only)       Balance Overall balance assessment: Needs assistance;History of Falls Sitting-balance support: Feet supported;No upper extremity supported Sitting balance-Leahy Scale: Fair     Standing balance support: Bilateral upper extremity supported;During functional activity Standing balance-Leahy Scale: Poor Standing balance comment: UE support required for balance                    Cognition Arousal/Alertness: Awake/alert Behavior During Therapy: WFL for tasks assessed/performed Overall Cognitive Status: Within Functional Limits for tasks assessed                      Exercises      General Comments        Pertinent  Vitals/Pain Pain Assessment: Faces Faces Pain Scale: Hurts little more Pain Location: LLE Pain Descriptors / Indicators: Operative site guarding Pain Intervention(s): Limited activity within patient's tolerance;Monitored during session;Repositioned    Home Living                      Prior Function            PT Goals (current goals can  now be found in the care plan section) Acute Rehab PT Goals Patient Stated Goal: Return home  PT Goal Formulation: With patient Time For Goal Achievement: 03/14/16 Potential to Achieve Goals: Good Progress towards PT goals: Progressing toward goals    Frequency  Min 3X/week    PT Plan Current plan remains appropriate    Co-evaluation             End of Session Equipment Utilized During Treatment: Gait belt Activity Tolerance: Patient limited by fatigue Patient left: in chair;with call bell/phone within reach     Time: 1100-1138 PT Time Calculation (min) (ACUTE ONLY): 38 min  Charges:  $Gait Training: 23-37 mins $Therapeutic Activity: 8-22 mins                    G Codes:      Rolinda Roan 2016-03-10, 1:33 PM   Rolinda Roan, PT, DPT Acute Rehabilitation Services Pager: [phone]

## 2016-03-01 NOTE — Progress Notes (Signed)
RN spoke with MD Karleen Hampshire regarding discontinued insulin. MD states give the 1700 doses. RN administered medication per sliding scale and order. Dorita Fray 03/01/2016 6:02 PM

## 2016-03-02 LAB — URINALYSIS, ROUTINE W REFLEX MICROSCOPIC
Bilirubin Urine: NEGATIVE
Glucose, UA: 100 mg/dL — AB
Hgb urine dipstick: NEGATIVE
Ketones, ur: NEGATIVE mg/dL
Nitrite: NEGATIVE
Protein, ur: NEGATIVE mg/dL
Specific Gravity, Urine: 1.02 (ref 1.005–1.030)
pH: 5.5 (ref 5.0–8.0)

## 2016-03-02 LAB — GLUCOSE, CAPILLARY
Glucose-Capillary: 157 mg/dL — ABNORMAL HIGH (ref 65–99)
Glucose-Capillary: 112 mg/dL — ABNORMAL HIGH (ref 65–99)

## 2016-03-02 LAB — URINE MICROSCOPIC-ADD ON: RBC / HPF: NONE SEEN RBC/hpf (ref 0–5)

## 2016-03-02 MED ORDER — PREDNISONE 20 MG PO TABS: 40.0000 mg | ORAL_TABLET | Freq: Every day | ORAL | Status: DC

## 2016-03-02 MED ORDER — PREDNISONE 20 MG PO TABS: ORAL_TABLET | ORAL | Status: DC

## 2016-03-02 NOTE — Discharge Summary (Signed)
Physician Discharge Summary  KEA CALLAN OEU:[id number] DOB: 1941/08/04 DOA: 02/26/2016  PCP: [name], MD  Admit date: 02/26/2016 Discharge date: 03/02/2016  Time spent: 25 minutes  Recommendations for Outpatient Follow-up:  1. Follow up with PCP IN ONE WEEK  2. Follow up with GI IN ONE WEEK.   Discharge Diagnoses:  Principal Problem:   DKA (diabetic ketoacidoses) ([place]) Active Problems:   Malignant neoplasm of right upper lobe of lung (HCC)   Dehydration   Acute renal insufficiency   DM type 2 (diabetes mellitus, type 2) (HCC)   Diarrhea   Leukocytosis   Skin breakdown   Hyponatremia   Lactic acidosis   Pressure ulcer   Malnutrition of moderate degree   Discharge Condition: improved  Diet recommendation: carb modified  Filed Weights   02/27/16 0502  Weight: 73 kg (160 lb 15 oz)    History of present illness:  75 y.o. female with a history of malignant neoplasm RUL, RA, persisting diarrhea w/ diffuse colitis since early April 2017, and DM w/ diabetic ketoacidosis/coma who presented to the ED with hyperglycemia. Evaluation in the emergency room revealed DKA.  Hospital Course:  DKA - uncontrolled DM2 DKA resolved - CBG's slightly elevated from steroids.       Uncontrolled Severe Ulcerative colitis - ongoing diarrhea  Diarrhea initially thought to have been related to her immunotherapy (has now been off for ~2.5 months) - biopsy during colonoscopy 02/07/16 c/w UC and pt was started on mesalamine - has continued to have diarrhea - initiated short course of steroid which appears to be making a significant impact - will not yet consult GI for inpatient visit as the patient appears to be improving at this time Change to po steroids, plan for quick taper.   Acute renal injury  Due to prerenal azotemia due to very limited intake - resolved with volume resuscitation  Last Labs      Recent Labs Lab 02/27/16 0401 02/27/16 1806 02/28/16 0239  02/29/16 0420 03/01/16 0659  CREATININE 1.11* 0.88 0.89 0.67 0.61      Possible hypothyroidism TSH elevated on last 2 checks but FT4 normal so will not start tx  Stage III Squamous cell CA of R lung Followed by Dr. Inda Merlin who has been added as a consult - no acute questions for him at this time   Recent L distal fibula fx s/p surgical repair Has not been able to participate w/ rehab due to weakness/general FTT - at time of discharge 02/12/16 was to be touchdown weightbearing only while wearing fracture boot - was to f/u in office w/ Dr. Sharol Given one week after d/c but was unable to keep this appointment due to illness -  Dr. Sharol Given saw pt recommended Weight brearing as tolerated with her fracture boot in place.   Stage 1 skin breakdown - low back/sacrum WOC Team / RN following       Procedures:  NONE  Consultations:  Dr Sharol Given.   Discharge Exam: Filed Vitals:   03/02/16 0558 03/02/16 0941  BP: 119/69   Pulse: 83 93  Temp: 97.7 F (36.5 C)   Resp: 19 20    General: alert comfortable Cardiovascular: s1s2 Respiratory: ctab  Discharge Instructions   Discharge Instructions    Diet Carb Modified    Complete by:  As directed      Discharge instructions    Complete by:  As directed   Follow up with Gastroenterology in one week.     Weight bearing as  tolerated    Complete by:  As directed   Laterality:  left  Extremity:  Lower  Weightbearing as tolerated with fracture boot in place          Current Discharge Medication List    START taking these medications   Details  predniSONE (DELTASONE) 20 MG tablet Prednisone 40 mg daily for 5 days. Qty: 10 tablet, Refills: 0      CONTINUE these medications which have NOT CHANGED   Details  acetaminophen (TYLENOL) 325 MG tablet Take 650 mg by mouth every 6 (six) hours as needed for mild pain.    budesonide-formoterol (SYMBICORT) 80-4.5 MCG/ACT inhaler Inhale 2 puffs into the lungs 2 (two) times daily.     diphenoxylate-atropine (LOMOTIL) 2.5-0.025 MG tablet Take 1 tablet by mouth 4 (four) times daily as needed for diarrhea or loose stools.    fluticasone (CUTIVATE) 0.05 % cream Apply 1 application topically 2 (two) times daily as needed (rash).     gabapentin (NEURONTIN) 300 MG capsule Take 1 capsule (300 mg total) by mouth 2 (two) times daily. Qty: 30 capsule, Refills: 0    insulin aspart (NOVOLOG) 100 UNIT/ML injection Inject 0-15 Units into the skin 3 (three) times daily with meals. Sliding scale  CBG 70 - 120: 0 units: CBG 121 - 150: 2 units; CBG 151 - 200: 3 units; CBG 201 - 250: 5 units; CBG 251 - 300: 8 units;CBG 301 - 350: 11 units; CBG 351 - 400: 15 units; CBG > 400 : 15 units and notify MD Qty: 10 mL, Refills: 11    insulin detemir (LEVEMIR) 100 UNIT/ML injection Inject 0.2 mLs (20 Units total) into the skin at bedtime. Qty: 10 mL, Refills: 0    ipratropium-albuterol (DUONEB) 0.5-2.5 (3) MG/3ML SOLN Take 3 mLs by nebulization every 4 (four) hours as needed.    ketoconazole (NIZORAL) 2 % cream Apply 1 application topically 2 (two) times daily as needed for irritation.  Refills: 5    Mesalamine (ASACOL HD) 800 MG TBEC Take 3 tablets (2,400 mg total) by mouth 2 (two) times daily. Qty: 180 tablet, Refills: 2    saccharomyces boulardii (FLORASTOR) 250 MG capsule Take 1 capsule (250 mg total) by mouth 2 (two) times daily.    VESICARE 5 MG tablet Take 5 mg by mouth daily. Refills: 6    Vitamin D, Ergocalciferol, (DRISDOL) 50000 units CAPS capsule Take 1 capsule (50,000 Units total) by mouth every 7 (seven) days. Qty: 30 capsule      STOP taking these medications     HYDROcodone-acetaminophen (NORCO/VICODIN) 5-325 MG tablet        Allergies  Allergen Reactions  . Carboplatin Other (See Comments)    Neuropathy  . Remicade [Infliximab] Anaphylaxis  . Hydromorphone Rash   Follow-up Information    Follow up with DUDA,MARCUS V, MD In 1 week.   Specialty:  Orthopedic Surgery    Contact information:   [place] [address] [phone]       Follow up with [name], MD. Schedule an appointment as soon as possible for a visit in 1 week.   Specialty:  Endocrinology   Contact information:   [address] [place] [address] [phone]       Follow up with Nelida Meuse III, MD. Schedule an appointment as soon as possible for a visit in 1 week.   Specialty:  Gastroenterology   Contact information:   [address]  [place] [address] [phone]        The results of significant diagnostics from this hospitalization (including imaging, microbiology, ancillary and laboratory) are listed below for reference.    Significant Diagnostic Studies: Ct Abdomen Pelvis Wo Contrast  02/04/2016  CLINICAL DATA:  Lower abdominal pain with gas and bloating, diarrhea for 4 days, bright red blood in stool, rectal bleeding, hypertension, rheumatoid arthritis, lung cancer post radiation therapy, diabetes mellitus, former smoker EXAM: CT ABDOMEN AND PELVIS WITHOUT CONTRAST TECHNIQUE: Multidetector CT imaging of the abdomen and pelvis was performed following the standard protocol without IV contrast. Sagittal and coronal MPR images reconstructed from axial data set. COMPARISON:  PET-CT 11/14/2013, chest CT 11/30/2015 FINDINGS: Soft tissue density adjacent to RIGHT heart border likely reflects RIGHT middle lobe atelectasis, unchanged. Calcified granuloma LEFT lower lobe image 6 with additional tiny LEFT lower lobe nodule image 9, stable. Calcified granulomata within spleen. 9 mm cyst LEFT lobe liver image 11 unchanged. Questionable 7 mm subcapsular lesion lateral RIGHT lobe liver image 34. Lesion anterior lateral segment LEFT lobe liver 19 x 13 mm image 19 previously 14 x 9 mm. Gallbladder surgically absent. Remainder of liver, spleen, pancreas, kidneys, and adrenal glands normal. Wall thickening of ascending colon through  distal transverse colon consistent with colitis. Associated significant hyperemia of transverse mesocolon. Cecum and distal colon appear unaffected. Stomach and small bowel loops normal appearance. Normal appendix Uterus surgically absent with nonvisualization of ovaries. No additional mass, adenopathy, free air free fluid. Bladder and ureters normal appearance. Scattered atherosclerotic calcifications. No acute osseous findings. IMPRESSION: Diffuse wall thickening of the ascending through distal transverse colon with associated hyperemia of the mesocolon compatible with colitis ; differential diagnosis includes infection and inflammatory bowel disease, ischemia less likely with this distribution though not excluded. Interval increase in size of a lesion at the lateral segment LEFT lobe liver question hepatic metastasis. Electronically Signed   By: Lavonia Dana M.D.   On: 02/04/2016 16:47   Dg Chest Portable 1 View  02/26/2016  CLINICAL DATA:  Elevated blood sugar. Decreased appetite. History of right middle lobe lung cancer. Clinical concern for pneumonia. EXAM: PORTABLE CHEST 1 VIEW COMPARISON:  Chest radiograph 02/18/2016.  Chest CT 11/30/2015 FINDINGS: Chronic volume loss in the right lung is unchanged. Rightward mediastinal shift is unchanged. There is no consolidation. The left lung is clear. No pulmonary edema. No pneumothorax or large pleural effusion. Osseous structures appear unchanged. IMPRESSION: No evidence of pneumonia.  Chronic volume loss in the right lung. Electronically Signed   By: Jeb Levering M.D.   On: 02/26/2016 12:35   Portable Chest X-ray (1 View)  02/18/2016  CLINICAL DATA:  Short of breath EXAM: PORTABLE CHEST 1 VIEW COMPARISON:  Radiograph 02/17/2016 FINDINGS: Patient is rotated rightward. There is collapse of the RIGHT middle lobe again demonstrated. Mediastinal shift to the RIGHT. No acute osseous findings. LEFT lung is clear. IMPRESSION: 1. No acute cardiopulmonary findings.  2. Chronic collapse the RIGHT middle lobe. Electronically Signed   By: Suzy Bouchard M.D.   On: 02/18/2016 07:17   Dg Chest Port 1 View  02/17/2016  CLINICAL DATA:  Dyspnea. History of hypertension. Previous right lung cancer. EXAM: PORTABLE CHEST 1 VIEW COMPARISON:  Chest 02/04/2016.  CT chest 11/30/2015 FINDINGS: Unchanged appearance of the chest with chronic volume loss in the right upper and middle lungs associated with mediastinal shift towards the right and hyperinflation of the left lung. No acute consolidation or airspace disease. No blunting of costophrenic angles.  No pneumothorax. Heart size and pulmonary vascularity are normal. IMPRESSION: Chronic volume loss in the right middle and upper lungs. No evidence of active pulmonary disease. Electronically Signed   By: Lucienne Capers M.D.   On: 02/17/2016 05:52   Dg Chest Port 1 View  02/04/2016  CLINICAL DATA:  Cough, hypotension and diarrhea. History of squamous cell carcinoma of the lung. EXAM: PORTABLE CHEST 1 VIEW COMPARISON:  CT of the chest on 11/30/2015 FINDINGS: There is chronic volume loss of the right lung with dense chronic atelectasis and consolidation of the right middle lobe. No edema, infiltrate or pneumothorax identified. No pleural effusions are seen. The heart size is normal. IMPRESSION: Chronic right middle lobe atelectasis and consolidation. Electronically Signed   By: Aletta Edouard M.D.   On: 02/04/2016 22:02    Microbiology: Recent Results (from the past 240 hour(s))  Urine culture     Status: Abnormal   Collection Time: 02/26/16 11:26 AM  Result Value Ref Range Status   Specimen Description URINE, CATHETERIZED  Final   Special Requests NONE  Final   Culture 7,000 COLONIES/mL INSIGNIFICANT GROWTH (A)  Final   Report Status 02/28/2016 FINAL  Final  C difficile quick scan w PCR reflex     Status: None   Collection Time: 02/26/16  5:39 PM  Result Value Ref Range Status   C Diff antigen NEGATIVE NEGATIVE Final   C  Diff toxin NEGATIVE NEGATIVE Final   C Diff interpretation Negative for toxigenic C. difficile  Final  Gastrointestinal Panel by PCR , Stool     Status: None   Collection Time: 02/26/16  5:39 PM  Result Value Ref Range Status   Campylobacter species NOT DETECTED NOT DETECTED Final   Plesimonas shigelloides NOT DETECTED NOT DETECTED Final   Salmonella species NOT DETECTED NOT DETECTED Final   Yersinia enterocolitica NOT DETECTED NOT DETECTED Final   Vibrio species NOT DETECTED NOT DETECTED Final   Vibrio cholerae NOT DETECTED NOT DETECTED Final   Enteroaggregative E coli (EAEC) NOT DETECTED NOT DETECTED Final   Enteropathogenic E coli (EPEC) NOT DETECTED NOT DETECTED Final   Enterotoxigenic E coli (ETEC) NOT DETECTED NOT DETECTED Final   Shiga like toxin producing E coli (STEC) NOT DETECTED NOT DETECTED Final   E. coli O157 NOT DETECTED NOT DETECTED Final   Shigella/Enteroinvasive E coli (EIEC) NOT DETECTED NOT DETECTED Final   Cryptosporidium NOT DETECTED NOT DETECTED Final   Cyclospora cayetanensis NOT DETECTED NOT DETECTED Final   Entamoeba histolytica NOT DETECTED NOT DETECTED Final   Giardia lamblia NOT DETECTED NOT DETECTED Final   Adenovirus F40/41 NOT DETECTED NOT DETECTED Final   Astrovirus NOT DETECTED NOT DETECTED Final   Norovirus GI/GII NOT DETECTED NOT DETECTED Final   Rotavirus A NOT DETECTED NOT DETECTED Final   Sapovirus (I, II, IV, and V) NOT DETECTED NOT DETECTED Final  MRSA PCR Screening     Status: None   Collection Time: 02/26/16  7:15 PM  Result Value Ref Range Status   MRSA by PCR NEGATIVE NEGATIVE Final    Comment:        The GeneXpert MRSA Assay (FDA approved for NASAL specimens only), is one component of a comprehensive MRSA colonization surveillance program. It is not intended to diagnose MRSA infection nor to guide or monitor treatment for MRSA infections.      Labs: Basic Metabolic Panel:  Recent Labs Lab 02/26/16 1656  02/27/16 0401  02/27/16 1806 02/28/16 1308 02/29/16 6578 03/01/16 4696  NA 130*  < > 131* 129* 133* 133* 136  K 4.4  < > 3.9 3.5 4.3 4.4 4.1  CL 102  < > 103 103 104 105 105  CO2 18*  < > 18* 18* 20* 21* 22  GLUCOSE 202*  < > 137* 138* 153* 352* 248*  BUN 30*  < > 26* '19 15 10 12  '$ CREATININE 1.39*  < > 1.11* 0.88 0.89 0.67 0.61  CALCIUM 7.5*  < > 7.8* 7.3* 7.6* 7.4* 7.6*  MG 1.7  --   --   --   --   --   --   PHOS 3.6  --   --   --   --   --   --   < > = values in this interval not displayed. Liver Function Tests:  Recent Labs Lab 02/26/16 1213 02/28/16 0239 02/29/16 0420  AST 15 16 8*  ALT 8* 8* 7*  ALKPHOS 113 78 82  BILITOT 1.4* 1.0 0.5  PROT 4.7* 3.6* 3.6*  ALBUMIN 2.4* 1.9* 1.9*    Recent Labs Lab 02/26/16 1213  LIPASE 18   No results for input(s): AMMONIA in the last 168 hours. CBC:  Recent Labs Lab 02/26/16 1036 02/27/16 0019 02/28/16 0239 02/29/16 0420 03/01/16 0659  WBC 20.1* 17.2* 10.5 6.4 7.5  NEUTROABS 17.5*  --   --   --   --   HGB 16.3* 13.2 12.9 11.5* 11.1*  HCT 48.5* 40.3 39.1 34.1* 33.8*  MCV 88.8 87.6 89.9 88.8 90.1  PLT 355 330 307 256 275   Cardiac Enzymes: No results for input(s): CKTOTAL, CKMB, CKMBINDEX, TROPONINI in the last 168 hours. BNP: BNP (last 3 results) No results for input(s): BNP in the last 8760 hours.  ProBNP (last 3 results) No results for input(s): PROBNP in the last 8760 hours.  CBG:  Recent Labs Lab 03/01/16 1227 03/01/16 1741 03/01/16 2131 03/02/16 0847 03/02/16 1242  GLUCAP 342* 202* 234* 157* 112*       Signed:  Tyrhonda Georgiades MD.  [facility] 03/02/2016, 1:48 PM

## 2016-03-02 NOTE — Progress Notes (Signed)
Pt is refusing her medication; mesalamine (LIALDA) EC tablet 2.4 g constantly. Physician sticky note was left. Will continue to monitor.

## 2016-03-02 NOTE — Progress Notes (Signed)
Nsg Discharge Note  Admit Date:  02/26/2016 Discharge date: 03/02/2016   Megan [name] to be D/C'd [name] per MD order.  Report called to [facility], no questions or concerns voiced. Pt's friends is transporting pt to facility.   Discharge Medication:   Medication List    STOP taking these medications        HYDROcodone-acetaminophen 5-325 MG tablet  Commonly known as:  NORCO/VICODIN      TAKE these medications        acetaminophen 325 MG tablet  Commonly known as:  TYLENOL  Take 650 mg by mouth every 6 (six) hours as needed for mild pain.     budesonide-formoterol 80-4.5 MCG/ACT inhaler  Commonly known as:  SYMBICORT  Inhale 2 puffs into the lungs 2 (two) times daily.     diphenoxylate-atropine 2.5-0.025 MG tablet  Commonly known as:  LOMOTIL  Take 1 tablet by mouth 4 (four) times daily as needed for diarrhea or loose stools.     fluticasone 0.05 % cream  Commonly known as:  CUTIVATE  Apply 1 application topically 2 (two) times daily as needed (rash).     gabapentin 300 MG capsule  Commonly known as:  NEURONTIN  Take 1 capsule (300 mg total) by mouth 2 (two) times daily.     insulin aspart 100 UNIT/ML injection  Commonly known as:  novoLOG  Inject 0-15 Units into the skin 3 (three) times daily with meals. Sliding scale  CBG 70 - 120: 0 units: CBG 121 - 150: 2 units; CBG 151 - 200: 3 units; CBG 201 - 250: 5 units; CBG 251 - 300: 8 units;CBG 301 - 350: 11 units; CBG 351 - 400: 15 units; CBG > 400 : 15 units and notify MD     insulin detemir 100 UNIT/ML injection  Commonly known as:  LEVEMIR  Inject 0.2 mLs (20 Units total) into the skin at bedtime.     ipratropium-albuterol 0.5-2.5 (3) MG/3ML Soln  Commonly known as:  DUONEB  Take 3 mLs by nebulization every 4 (four) hours as needed.     ketoconazole 2 % cream  Commonly known as:  NIZORAL  Apply 1 application topically 2 (two) times daily as needed for irritation.     Mesalamine 800 MG Tbec  Commonly  known as:  ASACOL HD  Take 3 tablets (2,400 mg total) by mouth 2 (two) times daily.     predniSONE 20 MG tablet  Commonly known as:  DELTASONE  Prednisone 40 mg daily for 5 days.     saccharomyces boulardii 250 MG capsule  Commonly known as:  FLORASTOR  Take 1 capsule (250 mg total) by mouth 2 (two) times daily.     VESICARE 5 MG tablet  Generic drug:  solifenacin  Take 5 mg by mouth daily.     Vitamin D (Ergocalciferol) 50000 units Caps capsule  Commonly known as:  DRISDOL  Take 1 capsule (50,000 Units total) by mouth every 7 (seven) days.        Discharge Assessment: Filed Vitals:   03/02/16 0941 03/02/16 1441  BP:  108/61  Pulse: 93 90  Temp:  98.4 F (36.9 C)  Resp: 20 16   Skin clean, dry and intact without evidence of skin break down, no evidence of skin tears noted. IV catheter discontinued with catheter tips intact. Site without signs and symptoms of complications - no redness or edema noted at insertion site, patient denies c/o pain - only slight tenderness  at site.  Dressing with slight pressure applied. Patient in room getting dress at this time. Pt to call RN when is ready for wheelchair transport to main entrance. Call bell within reach. Bed low and locked. Friend at bedside.   Dorita Fray, RN 03/02/2016 3:31 PM

## 2016-03-02 NOTE — Progress Notes (Signed)
Pt escorted by CNA tech to main entrance via wheelchair. Dorita Fray 03/02/2016 4:29 PM

## 2016-03-02 NOTE — Progress Notes (Signed)
Patient will discharge to [facility] Anticipated discharge date: 5/3 Family notified: [name] at bedside Transportation by friend  CSW signing off.  Domenica Reamer, [place] Social Worker [phone]

## 2016-03-02 NOTE — Consult Note (Signed)
Lucky Trotta M. Haylen Shelnutt, EdD 

## 2016-03-03 ENCOUNTER — Non-Acute Institutional Stay (SKILLED_NURSING_FACILITY): Payer: Medicare Other | Admitting: Internal Medicine

## 2016-03-03 ENCOUNTER — Encounter: Payer: Self-pay | Admitting: Internal Medicine

## 2016-03-03 DIAGNOSIS — C3411 Malignant neoplasm of upper lobe, right bronchus or lung: Secondary | ICD-10-CM | POA: Diagnosis not present

## 2016-03-03 DIAGNOSIS — M792 Neuralgia and neuritis, unspecified: Secondary | ICD-10-CM

## 2016-03-03 DIAGNOSIS — I959 Hypotension, unspecified: Secondary | ICD-10-CM | POA: Diagnosis not present

## 2016-03-03 DIAGNOSIS — K51919 Ulcerative colitis, unspecified with unspecified complications: Secondary | ICD-10-CM | POA: Diagnosis not present

## 2016-03-03 DIAGNOSIS — R5381 Other malaise: Secondary | ICD-10-CM

## 2016-03-03 DIAGNOSIS — Z794 Long term (current) use of insulin: Secondary | ICD-10-CM | POA: Diagnosis not present

## 2016-03-03 DIAGNOSIS — S82402S Unspecified fracture of shaft of left fibula, sequela: Secondary | ICD-10-CM

## 2016-03-03 DIAGNOSIS — E440 Moderate protein-calorie malnutrition: Secondary | ICD-10-CM | POA: Diagnosis not present

## 2016-03-03 DIAGNOSIS — E1140 Type 2 diabetes mellitus with diabetic neuropathy, unspecified: Secondary | ICD-10-CM

## 2016-03-03 DIAGNOSIS — E162 Hypoglycemia, unspecified: Secondary | ICD-10-CM

## 2016-03-03 NOTE — Progress Notes (Signed)
LOCATION: [facility]  PCP: [name], MD   Code Status: Full Code  Goals of care: Advanced Directive information Advanced Directives 02/29/2016  Does patient have an advance directive? Yes  Type of Advance Directive -  Does patient want to make changes to advanced directive? -  Copy of advanced directive(s) in chart? -       Extended Emergency Contact Information Primary Emergency Contact: Beckerdite,George Address: [address]          [place], [place] [address] [place] Phone: [phone] Relation: Significant other Secondary Emergency Contact: Kateri Plummer [place] [employer] Phone: [phone] Relation: Friend   Allergies  Allergen Reactions  . Carboplatin Other (See Comments)    Neuropathy  . Remicade [Infliximab] Anaphylaxis  . Hydromorphone Rash    Chief Complaint  Patient presents with  . New Admit To SNF    New Admission     HPI:  Patient is a 75 y.o. female seen today for short term rehabilitation post hospital admission from 02/26/16-03/02/16 with DKA and ongoing diarrhea from ulcerative colitis. She also had AKI. She received iv fluids and iv insulin. She was started on prednisone with improvement in her bowel movement. She has PMH of stage 3 squamous cell cancer of right lung. She had a recent fall and left distal fibula fracture and had undergone surgical repair and has a boot for left leg. She is seen in her room today. She had low sugar reading this am in 30s and received 2 cups of orange juice and now her blood sugar is in 100s. She denies any symptom at present.   Review of Systems:  Constitutional: Negative for fever, diaphoresis. Energy level is slowly returning.  HENT: Negative for headache, congestion, nasal discharge, difficulty swallowing.   Eyes: Negative for blurred vision, double vision and discharge.  Respiratory: Negative for cough, shortness of breath and wheezing.     Cardiovascular: Negative for chest pain, palpitations, leg swelling.  Gastrointestinal: Negative for heartburn, nausea, vomiting, abdominal pain. Last bowel movement was yesterday. Genitourinary: Negative for flank pain. Positive for some burning with urination.  Musculoskeletal: Negative for fall in the facility.  Skin: Negative for itching, rash.  Neurological: Negative for dizziness. Psychiatric/Behavioral: Negative for depression   Past Medical History  Diagnosis Date  . Hypertension   . Rheumatoid arthritis ([place])   . GERD (gastroesophageal reflux disease)     no meds for  . Hx of radiation therapy 12/16/13-01/30/14    lung 66Gy  . Neuropathy ([place])   . Shortness of breath dyspnea     with exertion  . History of hiatal hernia   . Diabetes mellitus     insulin  . Cancer ([place]) dx'd 09/2013    Lung ca  . Malignant neoplasm of right upper lobe of lung ([place]) 11/28/2013  . Psoriasis    Past Surgical History  Procedure Laterality Date  . Cholecystectomy    . Abdominal hysterectomy    . Dilation and curettage of uterus    . Abdominal surgery    . Tonsillectomy    . Esophagogastroduodenoscopy (egd) with propofol N/A 10/09/2014    Procedure: ESOPHAGOGASTRODUODENOSCOPY (EGD) WITH PROPOFOL;  Surgeon: Inda Castle, MD;  Location: [facility];  Service: Endoscopy;  Laterality: N/A;  . Savory dilation N/A 10/09/2014    Procedure: SAVORY DILATION;  Surgeon: Inda Castle, MD;  Location: [facility];  Service: Endoscopy;  Laterality: N/A;  . Esophagogastroduodenoscopy N/A 12/12/2014  Procedure: ESOPHAGOGASTRODUODENOSCOPY (EGD);  Surgeon: Inda Castle, MD;  Location: [facility];  Service: Endoscopy;  Laterality: N/A;  with dilation  . Balloon dilation N/A 12/12/2014    Procedure: BALLOON DILATION;  Surgeon: Inda Castle, MD;  Location: [facility];  Service: Endoscopy;  Laterality: N/A;  . Orif ankle fracture Right 07/04/2015    Procedure: OPEN REDUCTION INTERNAL FIXATION  (ORIF) ANKLE FRACTURE;  Surgeon: Newt Minion, MD;  Location: [facility];  Service: Orthopedics;  Laterality: Right;  OPEN REDUCTION, INTERNAL FIXATION OF RIGHT ANKLE FRACTURE.   . Fracture surgery    . Colonoscopy N/A 02/07/2016    Procedure: COLONOSCOPY;  Surgeon: Doran Stabler, MD;  Location[facility] END[facility]COPY;  Service: Endoscopy;  Laterality: N/A;  . Orif ankle fracture Left 02/10/2016    Procedure: OPEN REDUCTION INTERNAL FIXATION (ORIF) ANKLE FRACTURE;  Surgeon: Newt Minion, MD;  Location: [facility];  Service: Orthopedics;  Laterality: Left;   Social History:   reports that she quit smoking about 18 years ago. Her smoking use included Cigarettes. She has a 135 pack-year smoking history. She has never used smokeless tobacco. She reports that she does not drink alcohol or use illicit drugs.  Family History  Problem Relation Age of [place]  . Hypertension Other   . Stroke Other   . Heart attack Other   . Hemachromatosis Other   . Rheum arthritis      both sets of grandparents and father  . Other Mother   . Other Father     failure to thrive    Medications:   Medication List       This list is accurate as of: 03/03/16 10:47 AM.  Always use your most recent med list.               acetaminophen 325 MG tablet  Commonly known as:  TYLENOL  Take 650 mg by mouth every 6 (six) hours as needed for mild pain.     budesonide-formoterol 80-4.5 MCG/ACT inhaler  Commonly known as:  SYMBICORT  Inhale 2 puffs into the lungs 2 (two) times daily.     diphenoxylate-atropine 2.5-0.025 MG tablet  Commonly known as:  LOMOTIL  Take 1 tablet by mouth 4 (four) times daily as needed for diarrhea or loose stools.     gabapentin 300 MG capsule  Commonly known as:  NEURONTIN  Take 1 capsule (300 mg total) by mouth 2 (two) times daily.     insulin aspart 100 UNIT/ML injection  Commonly known as:  novoLOG  Inject 0-15 Units into the skin 3 (three) times daily with meals. Sliding scale  CBG 70 - 120: 0  units: CBG 121 - 150: 2 units; CBG 151 - 200: 3 units; CBG 201 - 250: 5 units; CBG 251 - 300: 8 units;CBG 301 - 350: 11 units; CBG 351 - 400: 15 units; CBG > 400 : 15 units and notify MD     insulin detemir 100 UNIT/ML injection  Commonly known as:  LEVEMIR  Inject 0.2 mLs (20 Units total) into the skin at bedtime.     ipratropium-albuterol 0.5-2.5 (3) MG/3ML Soln  Commonly known as:  DUONEB  Take 3 mLs by nebulization every 4 (four) hours as needed.     Mesalamine 800 MG Tbec  Commonly known as:  ASACOL HD  Take 3 tablets (2,400 mg total) by mouth 2 (two) times daily.     predniSONE 20 MG tablet  Commonly known as:  DELTASONE  Prednisone 40 mg daily for 5 days.     saccharomyces boulardii 250 MG capsule  Commonly known as:  FLORASTOR  Take 1 capsule (250 mg total) by mouth 2 (two) times daily.     VESICARE 5 MG tablet  Generic drug:  solifenacin  Take 5 mg by mouth daily.     Vitamin D (Ergocalciferol) 50000 units Caps capsule  Commonly known as:  DRISDOL  Take 1 capsule (50,000 Units total) by mouth every 7 (seven) days.        Immunizations: Immunization History  Administered Date(s) Administered  . Influenza,inj,Quad PF,36+ Mos 11/07/2013, 07/14/2014, 08/25/2015  . PPD Test 03/02/2016  . Pneumococcal-Unspecified 12/02/2015     Physical Exam: Filed Vitals:   03/03/16 1038  BP: 99/65  Pulse: 100  Temp: 97.5 F (36.4 C)  TempSrc: Oral  Resp: 16  Height: '5\' 6"'$  (1.676 m)  Weight: 160 lb 14.4 oz (72.984 kg)  SpO2: 96%   Body mass index is 25.98 kg/(m^2).  General- elderly female, frail, well built, in no acute distress Head- normocephalic, atraumatic Nose- no nasal discharge Throat- moist mucus membrane Eyes- PERRLA, EOMI, no pallor, no icterus, no discharge, normal conjunctiva, normal sclera Neck- no cervical lymphadenopathy Cardiovascular- normal s1,s2, no murmur, no leg edema Respiratory- bilateral poor air movement, no wheeze, + rhonchi, no crackles,  no use of accessory muscles Abdomen- bowel sounds present, soft, non tender Musculoskeletal- able to move all 4 extremities,limited left lower extremity movement, dressing to left leg clean and dry, left foot boot at bedside Neurological- alert and oriented to person, place and time Skin- warm and dry, psoriasis lesions on her legs Psychiatry- somewhat anxious    Labs reviewed: Basic Metabolic Panel:  Recent Labs  02/19/16 0559 02/20/16 0338  02/26/16 1656  02/28/16 0239 02/29/16 0420 03/01/16 03/01/16 0659  NA  --  139  < > 130*  < > 133* 133* 136* 136  K  --  3.3*  < > 4.4  < > 4.3 4.4  --  4.1  CL  --  103  < > 102  < > 104 105  --  105  CO2  --  27  < > 18*  < > 20* 21*  --  22  GLUCOSE  --  160*  < > 202*  < > 153* 352*  --  248*  BUN  --  <5*  < > 30*  < > '15 10 12 12  '$ CREATININE  --  0.68  < > 1.39*  < > 0.89 0.67 0.6 0.61  CALCIUM  --  7.6*  < > 7.5*  < > 7.6* 7.4*  --  7.6*  MG 1.7 1.9  --  1.7  --   --   --   --   --   PHOS 2.1* 2.3*  --  3.6  --   --   --   --   --   < > = values in this interval not displayed. Liver Function Tests:  Recent Labs  02/26/16 1213 02/28/16 0239 02/29/16 0420  AST 15 16 8*  ALT 8* 8* 7*  ALKPHOS 113 78 82  BILITOT 1.4* 1.0 0.5  PROT 4.7* 3.6* 3.6*  ALBUMIN 2.4* 1.9* 1.9*    Recent Labs  02/17/16 0644 02/26/16 1213  LIPASE 14 18   No results for input(s): AMMONIA in the last 8760 hours. CBC:  Recent Labs  02/01/16 1139  02/17/16 0530  02/26/16 1036  02/28/16 0239  02/29/16 0420 03/01/16 03/01/16 0659  WBC 6.6  < > 13.9*  < > 20.1*  < > 10.5 6.4 7.5 7.5  NEUTROABS 5.5  --  10.7*  --  17.5*  --   --   --   --   --   HGB 14.2  < > 13.2  < > 16.3*  < > 12.9 11.5*  --  11.1*  HCT 41.5  < > 41.5  < > 48.5*  < > 39.1 34.1*  --  33.8*  MCV 86.8  < > 95.2  < > 88.8  < > 89.9 88.8  --  90.1  PLT 172  < > 589*  < > 355  < > 307 256  --  275  < > = values in this interval not displayed. Cardiac Enzymes:  Recent Labs   02/17/16 1320 02/17/16 1957 02/18/16 0755  TROPONINI 0.04* 0.07* 0.06*   BNP: Invalid input(s): POCBNP CBG:  Recent Labs  03/01/16 2131 03/02/16 0847 03/02/16 1242  GLUCAP 234* 157* 112*    Radiological Exams: Ct Abdomen Pelvis Wo Contrast  02/04/2016  CLINICAL DATA:  Lower abdominal pain with gas and bloating, diarrhea for 4 days, bright red blood in stool, rectal bleeding, hypertension, rheumatoid arthritis, lung cancer post radiation therapy, diabetes mellitus, former smoker EXAM: CT ABDOMEN AND PELVIS WITHOUT CONTRAST TECHNIQUE: Multidetector CT imaging of the abdomen and pelvis was performed following the standard protocol without IV contrast. Sagittal and coronal MPR images reconstructed from axial data set. COMPARISON:  PET-CT 11/14/2013, chest CT 11/30/2015 FINDINGS: Soft tissue density adjacent to RIGHT heart border likely reflects RIGHT middle lobe atelectasis, unchanged. Calcified granuloma LEFT lower lobe image 6 with additional tiny LEFT lower lobe nodule image 9, stable. Calcified granulomata within spleen. 9 mm cyst LEFT lobe liver image 11 unchanged. Questionable 7 mm subcapsular lesion lateral RIGHT lobe liver image 34. Lesion anterior lateral segment LEFT lobe liver 19 x 13 mm image 19 previously 14 x 9 mm. Gallbladder surgically absent. Remainder of liver, spleen, pancreas, kidneys, and adrenal glands normal. Wall thickening of ascending colon through distal transverse colon consistent with colitis. Associated significant hyperemia of transverse mesocolon. Cecum and distal colon appear unaffected. Stomach and small bowel loops normal appearance. Normal appendix Uterus surgically absent with nonvisualization of ovaries. No additional mass, adenopathy, free air free fluid. Bladder and ureters normal appearance. Scattered atherosclerotic calcifications. No acute osseous findings. IMPRESSION: Diffuse wall thickening of the ascending through distal transverse colon with associated  hyperemia of the mesocolon compatible with colitis ; differential diagnosis includes infection and inflammatory bowel disease, ischemia less likely with this distribution though not excluded. Interval increase in size of a lesion at the lateral segment LEFT lobe liver question hepatic metastasis. Electronically Signed   By: Lavonia Dana M.D.   On: 02/04/2016 16:47   Dg Chest Portable 1 View  02/26/2016  CLINICAL DATA:  Elevated blood sugar. Decreased appetite. History of right middle lobe lung cancer. Clinical concern for pneumonia. EXAM: PORTABLE CHEST 1 VIEW COMPARISON:  Chest radiograph 02/18/2016.  Chest CT 11/30/2015 FINDINGS: Chronic volume loss in the right lung is unchanged. Rightward mediastinal shift is unchanged. There is no consolidation. The left lung is clear. No pulmonary edema. No pneumothorax or large pleural effusion. Osseous structures appear unchanged. IMPRESSION: No evidence of pneumonia.  Chronic volume loss in the right lung. Electronically Signed   By: Jeb Levering M.D.   On: 02/26/2016 12:35   Portable Chest X-ray (1 View)  02/18/2016  CLINICAL DATA:  Short of breath EXAM: PORTABLE CHEST 1 VIEW COMPARISON:  Radiograph 02/17/2016 FINDINGS: Patient is rotated rightward. There is collapse of the RIGHT middle lobe again demonstrated. Mediastinal shift to the RIGHT. No acute osseous findings. LEFT lung is clear. IMPRESSION: 1. No acute cardiopulmonary findings. 2. Chronic collapse the RIGHT middle lobe. Electronically Signed   By: Suzy Bouchard M.D.   On: 02/18/2016 07:17   Dg Chest Port 1 View  02/17/2016  CLINICAL DATA:  Dyspnea. History of hypertension. Previous right lung cancer. EXAM: PORTABLE CHEST 1 VIEW COMPARISON:  Chest 02/04/2016.  CT chest 11/30/2015 FINDINGS: Unchanged appearance of the chest with chronic volume loss in the right upper and middle lungs associated with mediastinal shift towards the right and hyperinflation of the left lung. No acute consolidation or  airspace disease. No blunting of costophrenic angles. No pneumothorax. Heart size and pulmonary vascularity are normal. IMPRESSION: Chronic volume loss in the right middle and upper lungs. No evidence of active pulmonary disease. Electronically Signed   By: Lucienne Capers M.D.   On: 02/17/2016 05:52   Dg Chest Port 1 View  02/04/2016  CLINICAL DATA:  Cough, hypotension and diarrhea. History of squamous cell carcinoma of the lung. EXAM: PORTABLE CHEST 1 VIEW COMPARISON:  CT of the chest on 11/30/2015 FINDINGS: There is chronic volume loss of the right lung with dense chronic atelectasis and consolidation of the right middle lobe. No edema, infiltrate or pneumothorax identified. No pleural effusions are seen. The heart size is normal. IMPRESSION: Chronic right middle lobe atelectasis and consolidation. Electronically Signed   By: Aletta Edouard M.D.   On: 02/04/2016 22:02    Assessment/Plan  Physical deconditioning With generalized weakness. Will have her work with physical therapy and occupational therapy team to help with gait training and muscle strengthening exercises.fall precautions. Skin care. Encourage to be out of bed.   Protein calorie malnutrition Get dietary consult. Encourage po intake  Stage 3 squamous cell cancer of right lung Breathing currently stable. To follow with oncology. Continue her bronchodilator treatment  Ulcerative colitis Stable. Continue prednisone 40 mg daily until 03/07/16. Continue mesalamine bid and lomotil as needed for loose stool  Neuropathic pain from chemotherapy drugs. Continue neurontin 300 mg bid  Hypoglycemia Improved with orange juice. Monitor cbg closely with her on insulin  DM uncontrolled With recent DKA and hospitalization. Monitor cbg. Continue levemir 20 u daily with SSI novolog. Lab Results  Component Value Date   HGBA1C 12.8* 02/11/2016   Hypotension Monitor bp bid with orthostasis daily x 1 week. Off all bp medication. Hydration  encouraged. Consider compression stocking and abdominal binder if needed.  Left distal fibula fracture S/p surgical reapir, to wear her left foot boot and WBAT. To follow up with Dr Sharol Given    Goals of care: short term rehabilitation   Labs/tests ordered: cbc, cmp 03/07/16  Family/ staff Communication: reviewed care plan with patient and nursing supervisor    Blanchie Serve, MD Internal Medicine [place], [place] [address] Cell Phone (Monday-Friday 8 am - 5 pm): [phone] On Call: [phone] and follow prompts after 5 pm and on weekends Office Phone: [phone] Office Fax: [phone]

## 2016-03-04 ENCOUNTER — Telehealth: Payer: Self-pay | Admitting: Internal Medicine

## 2016-03-04 ENCOUNTER — Non-Acute Institutional Stay (SKILLED_NURSING_FACILITY): Payer: Medicare Other | Admitting: Family

## 2016-03-04 ENCOUNTER — Encounter: Payer: Self-pay | Admitting: Family

## 2016-03-04 DIAGNOSIS — E118 Type 2 diabetes mellitus with unspecified complications: Secondary | ICD-10-CM

## 2016-03-04 NOTE — Progress Notes (Signed)
Location:  [place] Room Number: 1205 Place of Service:  SNF (31) Provider:  Marlowe Sax, FNP-C Cher Nakai, MD   [name], MD  Patient Care Team: Anda Kraft, MD as PCP - General (Endocrinology) Curt Bears, MD as Consulting Physician (Oncology)  Extended Emergency Contact Information Primary Emergency Contact: Beckerdite,George Address: [address]          [place], [place] [address] [place] Phone: [phone] Relation: Significant other Secondary Emergency Contact: Kateri Plummer [place] Mobile Phone: [phone] Relation: Friend  Code Status:  Full Code  Goals of care: Advanced Directive information Advanced Directives 02/29/2016  Does patient have an advance directive? Yes  Type of Advance Directive -  Does patient want to make changes to advanced directive? -  Copy of advanced directive(s) in chart? -     Chief Complaint  Patient presents with  . Acute Visit    Follow up on Low Blood Sugar    HPI:  Pt is a 75 y.o. female seen today at [facility]  for an acute visit for evaluation of hypoglycemia. She has a medical history of HTN, RA, GERD, Type 2 DM, Neuropathy, Lung CA among others. She is Status post hospital admission from 02/26/16-03/02/16 with DKA and ongoing diarrhea from ulcerative colitis.She is seen in her room today.Facility Nurse supervisor reports patient had low fasting blood sugar readings two conscegative morning 33 and 55. She denies any acute issues this visit.  Past Medical History  Diagnosis Date  . Hypertension   . Rheumatoid arthritis ([place])   . GERD (gastroesophageal reflux disease)     no meds for  . Hx of radiation therapy 12/16/13-01/30/14    lung 66Gy  . Neuropathy ([place])   . Shortness of breath dyspnea     with exertion  . History of hiatal hernia   . Diabetes mellitus     insulin  . Cancer ([place]) dx'd 09/2013    Lung  ca  . Malignant neoplasm of right upper lobe of lung ([place]) 11/28/2013  . Psoriasis    Past Surgical History  Procedure Laterality Date  . Cholecystectomy    . Abdominal hysterectomy    . Dilation and curettage of uterus    . Abdominal surgery    . Tonsillectomy    . Esophagogastroduodenoscopy (egd) with propofol N/A 10/09/2014    Procedure: ESOPHAGOGASTRODUODENOSCOPY (EGD) WITH PROPOFOL;  Surgeon: Inda Castle, MD;  Location: [facility];  Service: Endoscopy;  Laterality: N/A;  . Savory dilation N/A 10/09/2014    Procedure: SAVORY DILATION;  Surgeon: Inda Castle, MD;  Location: [place];  Service: Endoscopy;  Laterality: N/A;  . Esophagogastroduodenoscopy N/A 12/12/2014    Procedure: ESOPHAGOGASTRODUODENOSCOPY (EGD);  Surgeon: Inda Castle, MD;  Location: [facility];  Service: Endoscopy;  Laterality: N/A;  with dilation  . Balloon dilation N/A 12/12/2014    Procedure: BALLOON DILATION;  Surgeon: Inda Castle, MD;  Location: [facility] ENDOSCOPY;  Service: Endoscopy;  Laterality: N/A;  . Orif ankle fracture Right 07/04/2015    Procedure: OPEN REDUCTION INTERNAL FIXATION (ORIF) ANKLE FRACTURE;  Surgeon: Newt Minion, MD;  Location: [facility];  Service: Orthopedics;  Laterality: Right;  OPEN REDUCTION, INTERNAL FIXATION OF RIGHT ANKLE FRACTURE.   . Fracture surgery    . Colonoscopy N/A 02/07/2016    Procedure: COLONOSCOPY;  Surgeon: Doran Stabler, MD;  Location: [facility] ENDOSCOPY;  Service: Endoscopy;  Laterality: N/A;  .  Orif ankle fracture Left 02/10/2016    Procedure: OPEN REDUCTION INTERNAL FIXATION (ORIF) ANKLE FRACTURE;  Surgeon: Newt Minion, MD;  Location: [facility];  Service: Orthopedics;  Laterality: Left;    Allergies  Allergen Reactions  . Carboplatin Other (See Comments)    Neuropathy  . Remicade [Infliximab] Anaphylaxis  . Hydromorphone Rash      Medication List       This list is accurate as of: 03/04/16 10:32 AM.  Always use your most recent med list.                 acetaminophen 325 MG tablet  Commonly known as:  TYLENOL  Take 650 mg by mouth every 6 (six) hours as needed for mild pain.     budesonide-formoterol 80-4.5 MCG/ACT inhaler  Commonly known as:  SYMBICORT  Inhale 2 puffs into the lungs 2 (two) times daily.     diphenoxylate-atropine 2.5-0.025 MG tablet  Commonly known as:  LOMOTIL  Take 1 tablet by mouth 4 (four) times daily as needed for diarrhea or loose stools.     fluticasone 0.05 % cream  Commonly known as:  CUTIVATE  Apply cream to rash/psoriasis on sacrum topically twice daily. Apply 1st before Nizoral cream.     gabapentin 300 MG capsule  Commonly known as:  NEURONTIN  Take 1 capsule (300 mg total) by mouth 2 (two) times daily.     insulin detemir 100 UNIT/ML injection  Commonly known as:  LEVEMIR  Inject 0.2 mLs (20 Units total) into the skin at bedtime.     insulin lispro 100 UNIT/ML injection  Commonly known as:  HUMALOG  Check CBG inject SSI Sub Q 3 times daily with meals. DM 0-69 = hypoglycemic protocol, 70 -120 = 0 units 121- 150 = 2 units, 151 - 200 = 3 units, 201 - 250 = 5 units, 251 - 300 = 8 units, 301 - 350 = 11 units, 351 - 400 = 15 units > 400 = 15 units Call MD.     ipratropium-albuterol 0.5-2.5 (3) MG/3ML Soln  Commonly known as:  DUONEB  Take 3 mLs by nebulization every 4 (four) hours as needed.     ketoconazole 2 % cream  Commonly known as:  NIZORAL  Apply 1 application topically daily. Apply cream to rash/psoriasis to sacrum topically twice a daily. Apply after Cutivate cream.     Mesalamine 800 MG Tbec  Commonly known as:  ASACOL HD  Take 3 tablets (2,400 mg total) by mouth 2 (two) times daily.     oxybutynin 5 MG 24 hr tablet  Commonly known as:  DITROPAN-XL  Take 5 mg by mouth at bedtime.     predniSONE 20 MG tablet  Commonly known as:  DELTASONE  Prednisone 40 mg daily for 5 days.     saccharomyces boulardii 250 MG capsule  Commonly known as:  FLORASTOR  Take 1 capsule (250 mg total)  by mouth 2 (two) times daily.     Vitamin D (Ergocalciferol) 50000 units Caps capsule  Commonly known as:  DRISDOL  Take 1 capsule (50,000 Units total) by mouth every 7 (seven) days.        Review of Systems  Constitutional: Negative for fever, chills, activity change, appetite change and fatigue.  HENT: Negative.   Eyes: Negative for discharge and visual disturbance.  Respiratory: Negative for cough, chest tightness, shortness of breath and wheezing.   Gastrointestinal: Negative for nausea, vomiting, abdominal pain, constipation and abdominal distention.  Endocrine: Negative for polydipsia, polyphagia and polyuria.  Genitourinary: Negative for dysuria, urgency, frequency and flank pain.  Skin: Negative.   Neurological: Negative for dizziness, seizures, syncope, weakness, light-headedness and headaches.  Psychiatric/Behavioral: Negative.     Immunization History  Administered Date(s) Administered  . Influenza,inj,Quad PF,36+ Mos 11/07/2013, 07/14/2014, 08/25/2015  . PPD Test 03/02/2016  . Pneumococcal-Unspecified 12/02/2015   Pertinent  Health Maintenance Due  Topic Date Due  . FOOT EXAM  12/06/1950  . OPHTHALMOLOGY EXAM  12/06/1950  . URINE MICROALBUMIN  12/06/1950  . DEXA SCAN  12/05/2006  . MAMMOGRAM  12/11/2015  . INFLUENZA VACCINE  05/31/2016  . HEMOGLOBIN A1C  08/12/2016  . PNA vac Low Risk Adult (2 of 2 - PCV13) 12/01/2016  . COLONOSCOPY  02/06/2026   Fall Risk  12/15/2014 11/24/2014 11/10/2014 10/23/2014 10/13/2014  Falls in the past year? No No No No No  Number falls in past yr: - - - - -  Injury with Fall? No - - No No  Risk for fall due to : - Impaired balance/gait - Impaired balance/gait;Impaired mobility Impaired balance/gait;Impaired mobility  Risk for fall due to (comments): - - - - -   Functional Status Survey:    Filed Vitals:   03/04/16 1024  BP: 101/62  Pulse: 88  Temp: 97.7 F (36.5 C)  Resp: 16  Height: '5\' 6"'$  (1.676 m)  Weight: 160 lb  (72.576 kg)  SpO2: 96%   Body mass index is 25.84 kg/(m^2). Physical Exam  Constitutional: She is oriented to person, place, and time. She appears well-developed and well-nourished. No distress.  HENT:  Head: Normocephalic.  Mouth/Throat: Oropharynx is clear and moist.  Eyes: Conjunctivae are normal. Pupils are equal, round, and reactive to light. Right eye exhibits no discharge. Left eye exhibits no discharge. No scleral icterus.  Neck: Normal range of motion. Neck supple. No JVD present.  Cardiovascular: Normal rate, regular rhythm, normal heart sounds and intact distal pulses.  Exam reveals no gallop and no friction rub.   No murmur heard. Pulmonary/Chest: Effort normal and breath sounds normal. No respiratory distress. She has no wheezes. She has no rales.  Abdominal: Soft. Bowel sounds are normal. She exhibits no distension. There is no tenderness. There is no rebound and no guarding.  Musculoskeletal: She exhibits no edema.  Left leg boot in place.   Lymphadenopathy:    She has no cervical adenopathy.  Neurological: She is oriented to person, place, and time.  Skin: Skin is warm and dry. No rash noted. No erythema. No pallor.  Psychiatric: She has a normal mood and affect.    Labs reviewed:  Recent Labs  02/19/16 0559 02/20/16 0338  02/26/16 1656  02/28/16 0239 02/29/16 0420 03/01/16 03/01/16 0659  NA  --  139  < > 130*  < > 133* 133* 136* 136  K  --  3.3*  < > 4.4  < > 4.3 4.4  --  4.1  CL  --  103  < > 102  < > 104 105  --  105  CO2  --  27  < > 18*  < > 20* 21*  --  22  GLUCOSE  --  160*  < > 202*  < > 153* 352*  --  248*  BUN  --  <5*  < > 30*  < > '15 10 12 12  '$ CREATININE  --  0.68  < > 1.39*  < > 0.89 0.67 0.6 0.61  CALCIUM  --  7.6*  < > 7.5*  < > 7.6* 7.4*  --  7.6*  MG 1.7 1.9  --  1.7  --   --   --   --   --   PHOS 2.1* 2.3*  --  3.6  --   --   --   --   --   < > = values in this interval not displayed.  Recent Labs  02/26/16 1213 02/28/16 0239  02/29/16 0420  AST 15 16 8*  ALT 8* 8* 7*  ALKPHOS 113 78 82  BILITOT 1.4* 1.0 0.5  PROT 4.7* 3.6* 3.6*  ALBUMIN 2.4* 1.9* 1.9*    Recent Labs  02/01/16 1139  02/17/16 0530  02/26/16 1036  02/28/16 0239 02/29/16 0420 03/01/16 03/01/16 0659  WBC 6.6  < > 13.9*  < > 20.1*  < > 10.5 6.4 7.5 7.5  NEUTROABS 5.5  --  10.7*  --  17.5*  --   --   --   --   --   HGB 14.2  < > 13.2  < > 16.3*  < > 12.9 11.5*  --  11.1*  HCT 41.5  < > 41.5  < > 48.5*  < > 39.1 34.1*  --  33.8*  MCV 86.8  < > 95.2  < > 88.8  < > 89.9 88.8  --  90.1  PLT 172  < > 589*  < > 355  < > 307 256  --  275  < > = values in this interval not displayed. Lab Results  Component Value Date   TSH 8.675* 02/28/2016   Lab Results  Component Value Date   HGBA1C 12.8* 02/11/2016   No results found for: CHOL, HDL, LDLCALC, LDLDIRECT, TRIG, CHOLHDL  Significant Diagnostic Results in last 30 days:  Ct Abdomen Pelvis Wo Contrast  02/04/2016  CLINICAL DATA:  Lower abdominal pain with gas and bloating, diarrhea for 4 days, bright red blood in stool, rectal bleeding, hypertension, rheumatoid arthritis, lung cancer post radiation therapy, diabetes mellitus, former smoker EXAM: CT ABDOMEN AND PELVIS WITHOUT CONTRAST TECHNIQUE: Multidetector CT imaging of the abdomen and pelvis was performed following the standard protocol without IV contrast. Sagittal and coronal MPR images reconstructed from axial data set. COMPARISON:  PET-CT 11/14/2013, chest CT 11/30/2015 FINDINGS: Soft tissue density adjacent to RIGHT heart border likely reflects RIGHT middle lobe atelectasis, unchanged. Calcified granuloma LEFT lower lobe image 6 with additional tiny LEFT lower lobe nodule image 9, stable. Calcified granulomata within spleen. 9 mm cyst LEFT lobe liver image 11 unchanged. Questionable 7 mm subcapsular lesion lateral RIGHT lobe liver image 34. Lesion anterior lateral segment LEFT lobe liver 19 x 13 mm image 19 previously 14 x 9 mm. Gallbladder  surgically absent. Remainder of liver, spleen, pancreas, kidneys, and adrenal glands normal. Wall thickening of ascending colon through distal transverse colon consistent with colitis. Associated significant hyperemia of transverse mesocolon. Cecum and distal colon appear unaffected. Stomach and small bowel loops normal appearance. Normal appendix Uterus surgically absent with nonvisualization of ovaries. No additional mass, adenopathy, free air free fluid. Bladder and ureters normal appearance. Scattered atherosclerotic calcifications. No acute osseous findings. IMPRESSION: Diffuse wall thickening of the ascending through distal transverse colon with associated hyperemia of the mesocolon compatible with colitis ; differential diagnosis includes infection and inflammatory bowel disease, ischemia less likely with this distribution though not excluded. Interval increase in size of a lesion at the lateral segment LEFT lobe liver question hepatic metastasis. Electronically  Signed   By: Lavonia Dana M.D.   On: 02/04/2016 16:47   Dg Chest Portable 1 View  02/26/2016  CLINICAL DATA:  Elevated blood sugar. Decreased appetite. History of right middle lobe lung cancer. Clinical concern for pneumonia. EXAM: PORTABLE CHEST 1 VIEW COMPARISON:  Chest radiograph 02/18/2016.  Chest CT 11/30/2015 FINDINGS: Chronic volume loss in the right lung is unchanged. Rightward mediastinal shift is unchanged. There is no consolidation. The left lung is clear. No pulmonary edema. No pneumothorax or large pleural effusion. Osseous structures appear unchanged. IMPRESSION: No evidence of pneumonia.  Chronic volume loss in the right lung. Electronically Signed   By: Jeb Levering M.D.   On: 02/26/2016 12:35   Portable Chest X-ray (1 View)  02/18/2016  CLINICAL DATA:  Short of breath EXAM: PORTABLE CHEST 1 VIEW COMPARISON:  Radiograph 02/17/2016 FINDINGS: Patient is rotated rightward. There is collapse of the RIGHT middle lobe again  demonstrated. Mediastinal shift to the RIGHT. No acute osseous findings. LEFT lung is clear. IMPRESSION: 1. No acute cardiopulmonary findings. 2. Chronic collapse the RIGHT middle lobe. Electronically Signed   By: Suzy Bouchard M.D.   On: 02/18/2016 07:17   Dg Chest Port 1 View  02/17/2016  CLINICAL DATA:  Dyspnea. History of hypertension. Previous right lung cancer. EXAM: PORTABLE CHEST 1 VIEW COMPARISON:  Chest 02/04/2016.  CT chest 11/30/2015 FINDINGS: Unchanged appearance of the chest with chronic volume loss in the right upper and middle lungs associated with mediastinal shift towards the right and hyperinflation of the left lung. No acute consolidation or airspace disease. No blunting of costophrenic angles. No pneumothorax. Heart size and pulmonary vascularity are normal. IMPRESSION: Chronic volume loss in the right middle and upper lungs. No evidence of active pulmonary disease. Electronically Signed   By: Lucienne Capers M.D.   On: 02/17/2016 05:52   Dg Chest Port 1 View  02/04/2016  CLINICAL DATA:  Cough, hypotension and diarrhea. History of squamous cell carcinoma of the lung. EXAM: PORTABLE CHEST 1 VIEW COMPARISON:  CT of the chest on 11/30/2015 FINDINGS: There is chronic volume loss of the right lung with dense chronic atelectasis and consolidation of the right middle lobe. No edema, infiltrate or pneumothorax identified. No pleural effusions are seen. The heart size is normal. IMPRESSION: Chronic right middle lobe atelectasis and consolidation. Electronically Signed   By: Aletta Edouard M.D.   On: 02/04/2016 22:02    Assessment/Plan Type 2 Diabetes Mellitus  Hypoglycemia episodes reported x 2 In the morning. Last Hgb A1C 12.8 (02/11/2016). Continue current Humalog per Sliding scale. Reduce Levemir to 15 units SQ at bedtime. Encourage bedtime snack. Continue to monitor.    Family/ staff Communication:Reviewed plan of care with patient and facility Nurse supervisor  Labs/tests ordered:  None

## 2016-03-04 NOTE — Telephone Encounter (Signed)
Pt aware that Dr Chase Caller does not practice Primary Care for insurance purposes.  Gave number for Primary Care at [facility].  Nothing further needed.

## 2016-03-07 ENCOUNTER — Other Ambulatory Visit: Payer: Self-pay | Admitting: *Deleted

## 2016-03-07 ENCOUNTER — Telehealth: Payer: Self-pay | Admitting: Internal Medicine

## 2016-03-07 ENCOUNTER — Ambulatory Visit (HOSPITAL_BASED_OUTPATIENT_CLINIC_OR_DEPARTMENT_OTHER): Payer: Medicare Other | Admitting: Internal Medicine

## 2016-03-07 ENCOUNTER — Ambulatory Visit (HOSPITAL_COMMUNITY)
Admission: RE | Admit: 2016-03-07 | Discharge: 2016-03-07 | Disposition: A | Discharge status: Home / Self Care | Payer: Medicare Other | Source: Ambulatory Visit | Attending: Endocrinology | Admitting: Endocrinology

## 2016-03-07 ENCOUNTER — Ambulatory Visit (HOSPITAL_BASED_OUTPATIENT_CLINIC_OR_DEPARTMENT_OTHER): Payer: Medicare Other

## 2016-03-07 ENCOUNTER — Telehealth: Payer: Self-pay | Admitting: Gastroenterology

## 2016-03-07 ENCOUNTER — Encounter: Payer: Self-pay | Admitting: Internal Medicine

## 2016-03-07 VITALS — BP 108/68 | HR 116 | Temp 97.9°F | Resp 18 | Ht 66.0 in | Wt 130.3 lb

## 2016-03-07 DIAGNOSIS — E119 Type 2 diabetes mellitus without complications: Secondary | ICD-10-CM

## 2016-03-07 DIAGNOSIS — R197 Diarrhea, unspecified: Secondary | ICD-10-CM | POA: Diagnosis not present

## 2016-03-07 DIAGNOSIS — C3492 Malignant neoplasm of unspecified part of left bronchus or lung: Secondary | ICD-10-CM | POA: Diagnosis not present

## 2016-03-07 DIAGNOSIS — R634 Abnormal weight loss: Secondary | ICD-10-CM

## 2016-03-07 DIAGNOSIS — E860 Dehydration: Secondary | ICD-10-CM

## 2016-03-07 DIAGNOSIS — C3411 Malignant neoplasm of upper lobe, right bronchus or lung: Secondary | ICD-10-CM

## 2016-03-07 LAB — CBC WITH DIFFERENTIAL/PLATELET
BASO%: 0.4 % (ref 0.0–2.0)
Basophils Absolute: 0.1 10*3/uL (ref 0.0–0.1)
EOS%: 0.4 % (ref 0.0–7.0)
Eosinophils Absolute: 0.1 10*3/uL (ref 0.0–0.5)
HEMATOCRIT: 44.2 % (ref 34.8–46.6)
HEMOGLOBIN: 14.4 g/dL (ref 11.6–15.9)
LYMPH#: 2.1 10*3/uL (ref 0.9–3.3)
LYMPH%: 12.5 % — ABNORMAL LOW (ref 14.0–49.7)
MCH: 29.7 pg (ref 25.1–34.0)
MCHC: 32.6 g/dL (ref 31.5–36.0)
MCV: 91.1 fL (ref 79.5–101.0)
MONO#: 1.2 10*3/uL — AB (ref 0.1–0.9)
MONO%: 6.9 % (ref 0.0–14.0)
NEUT%: 79.8 % — ABNORMAL HIGH (ref 38.4–76.8)
NEUTROS ABS: 13.4 10*3/uL — AB (ref 1.5–6.5)
NRBC: 0 % (ref 0–0)
Platelets: 282 10*3/uL (ref 145–400)
RBC: 4.85 10*6/uL (ref 3.70–5.45)
RDW: 16.3 % — ABNORMAL HIGH (ref 11.2–14.5)
WBC: 16.8 10*3/uL — AB (ref 3.9–10.3)

## 2016-03-07 LAB — COMPREHENSIVE METABOLIC PANEL
ALBUMIN: 2.6 g/dL — AB (ref 3.5–5.0)
ALK PHOS: 117 U/L (ref 40–150)
ALT: 13 U/L (ref 0–55)
ANION GAP: 10 meq/L (ref 3–11)
AST: 8 U/L (ref 5–34)
BUN: 13.7 mg/dL (ref 7.0–26.0)
CALCIUM: 8.3 mg/dL — AB (ref 8.4–10.4)
CHLORIDE: 102 meq/L (ref 98–109)
CO2: 26 mEq/L (ref 22–29)
CREATININE: 0.8 mg/dL (ref 0.6–1.1)
EGFR: 71 mL/min/{1.73_m2} — ABNORMAL LOW (ref >90)
Glucose: 166 mg/dl — ABNORMAL HIGH (ref 70–140)
POTASSIUM: 3.9 meq/L (ref 3.5–5.1)
Sodium: 138 mEq/L (ref 136–145)
Total Bilirubin: 0.63 mg/dL (ref 0.20–1.20)
Total Protein: 5.1 g/dL — ABNORMAL LOW (ref 6.4–8.3)

## 2016-03-07 LAB — CBC AND DIFFERENTIAL
HCT: 40 % (ref 36–46)
Hemoglobin: 12.6 g/dL (ref 12.0–16.0)
PLATELETS: 266 10*3/uL (ref 150–399)
WBC: 15.3 10*3/mL

## 2016-03-07 LAB — HEPATIC FUNCTION PANEL
ALT: 8 U/L (ref 7–35)
AST: 7 U/L — AB (ref 13–35)
Alkaline Phosphatase: 117 U/L (ref 25–125)
BILIRUBIN, TOTAL: 0.6 mg/dL

## 2016-03-07 LAB — BASIC METABOLIC PANEL
BUN: 14 mg/dL (ref 4–21)
Creatinine: 0.7 mg/dL (ref 0.5–1.1)
GLUCOSE: 176 mg/dL
POTASSIUM: 4 mmol/L (ref 3.4–5.3)
Sodium: 141 mmol/L (ref 137–147)

## 2016-03-07 LAB — TECHNOLOGIST REVIEW

## 2016-03-07 MED ORDER — SODIUM CHLORIDE 0.9 % IV SOLN: 1000.0000 mL | INTRAVENOUS | Status: DC

## 2016-03-07 MED ORDER — SODIUM CHLORIDE 0.9 % IV SOLN: INTRAVENOUS | Status: DC

## 2016-03-07 MED ORDER — SODIUM CHLORIDE 0.9 % IV SOLN
1000.0000 mL | INTRAVENOUS | Status: AC
  Administered 2016-03-07: 1000 mL via INTRAVENOUS

## 2016-03-07 NOTE — Discharge Instructions (Signed)
Pt received 1000cc normal saline via piv.

## 2016-03-07 NOTE — Telephone Encounter (Signed)
per pof to sch pt appt-gave pt copy of avs-sent back ot lab °

## 2016-03-07 NOTE — Progress Notes (Signed)
Diagnosis Association: Dehydration (276.51  MD: Curt Bears  Procedure: Pt recieved 1000 cc normal saline via piv.  Pt tolerated procedure well.  Post procedure: Pt alert, oriented and ambulatory to wheelchair at discharge.

## 2016-03-07 NOTE — Addendum Note (Signed)
Addended by: Lucile Crater on: 03/07/2016 11:16 AM   Modules accepted: Orders

## 2016-03-07 NOTE — Addendum Note (Signed)
Addended by: Lucile Crater on: 03/07/2016 10:20 AM   Modules accepted: Orders

## 2016-03-07 NOTE — Progress Notes (Signed)
[place] [phone]   [phone]) [phone]  OFFICE PROGRESS NOTE  [name], MD [address] [place] [address]  DIAGNOSIS: Squamous cell lung cancer  Primary site: Lung (Right)  Staging method: AJCC 7th Edition  Clinical: Stage IIIB (T3, N3, M0)  Summary: Stage IIIB (T3, N3, M0)   PRIOR THERAPY:  1) Concurrent chemoradiation with weekly chemotherapy in the form of carboplatin for AUC of 2 and paclitaxel 45 mg/m2. Status post 7 week of treatment. Last dose was given 01/27/2014 no significant response in her disease.  2) Consolidation chemotherapy with carboplatin for AUC of 5 and paclitaxel 175 mg/M2 every 3 weeks with Neulasta support. First dose on 04/15/2014. Status post 3 cycles. Carboplatin was discontinued starting cycle #2 secondary to hypersensitivity reaction.  CURRENT THERAPY: Immunotherapy with Opdivo (Nivolumab) '3mg'$ /kg given every 2 weeks. Status post 33 cycles.  CHEMOTHERAPY INTENT: Control/Palliative  CURRENT # OF CHEMOTHERAPY CYCLES: 33 CURRENT ANTIEMETICS: Zofran, dexamethasone, Compazine  CURRENT SMOKING STATUS: Former smoker, quit 12/05/1997  ORAL CHEMOTHERAPY AND CONSENT: n/a  CURRENT BISPHOSPHONATES USE: None  PAIN MANAGEMENT: no cancer related pain, patient does have RA  NARCOTICS INDUCED CONSTIPATION: none  LIVING WILL AND CODE STATUS: ?  INTERVAL HISTORY: Megan [name] 75 y.o. female returns to the clinic today for follow-up visit. The patient has been observation for the last few months. She had several admissions recently to [facility] with diabetic ketoacidosis. She is not very happy with her current diabetes care and she is considering switching to a different endocrinologist. She continues to have significant fatigue and weakness. She also lost a lot of weight recently. The patient denied having any chest pain, shortness of breath, cough or hemoptysis. She has no  significant nausea or vomiting, no fever or chills. She is currently a resident of skilled nursing facility. She was supposed to have repeat CT scan of the chest on 03/01/2016 but she missed her appointment because of the recent hospitalization. She is currently followed by palliative care.  MEDICAL HISTORY: Past Medical History  Diagnosis Date  . Hypertension   . Rheumatoid arthritis ([place])   . GERD (gastroesophageal reflux disease)     no meds for  . Hx of radiation therapy 12/16/13-01/30/14    lung 66Gy  . Neuropathy ([place])   . Shortness of breath dyspnea     with exertion  . History of hiatal hernia   . Diabetes mellitus     insulin  . Cancer ([place]) dx'd 09/2013    Lung ca  . Malignant neoplasm of right upper lobe of lung ([place]) 11/28/2013  . Psoriasis     ALLERGIES:  is allergic to carboplatin; remicade; and hydromorphone.  MEDICATIONS:  Current Outpatient Prescriptions  Medication Sig Dispense Refill  . acetaminophen (TYLENOL) 325 MG tablet Take 650 mg by mouth every 6 (six) hours as needed for mild pain.    . budesonide-formoterol (SYMBICORT) 80-4.5 MCG/ACT inhaler Inhale 2 puffs into the lungs 2 (two) times daily.     . Clobetasol Propionate 0.05 % shampoo     . diphenoxylate-atropine (LOMOTIL) 2.5-0.025 MG tablet Take 1 tablet by mouth 4 (four) times daily as needed for diarrhea or loose stools.    [name] doxycycline (VIBRA-TABS) 100 MG tablet     . fluconazole (DIFLUCAN) 100 MG tablet     . fluticasone (CUTIVATE) 0.05 % cream Apply cream to rash/psoriasis on sacrum topically twice daily. Apply 1st before Nizoral cream.    .  gabapentin (NEURONTIN) 300 MG capsule Take 1 capsule (300 mg total) by mouth 2 (two) times daily. 30 capsule 0  . HORIZANT 600 MG TBCR     . insulin detemir (LEVEMIR) 100 UNIT/ML injection Inject 0.2 mLs (20 Units total) into the skin at bedtime. 10 mL 0  . insulin lispro (HUMALOG) 100 UNIT/ML injection Check CBG inject SSI Sub Q 3 times daily with meals. DM  0-69 = hypoglycemic protocol, 70 -120 = 0 units 121- 150 = 2 units, 151 - 200 = 3 units, 201 - 250 = 5 units, 251 - 300 = 8 units, 301 - 350 = 11 units, 351 - 400 = 15 units > 400 = 15 units Call MD.    . ipratropium-albuterol (DUONEB) 0.5-2.5 (3) MG/3ML SOLN Take 3 mLs by nebulization every 4 (four) hours as needed.    [name] ketoconazole (NIZORAL) 2 % cream Apply 1 application topically daily. Apply cream to rash/psoriasis to sacrum topically twice a daily. Apply after Cutivate cream.    . losartan-hydrochlorothiazide (HYZAAR) 100-25 MG tablet     . Mesalamine (ASACOL HD) 800 MG TBEC Take 3 tablets (2,400 mg total) by mouth 2 (two) times daily. 180 tablet 2  . Meth-Hyo-M Bl-Na Phos-Ph Sal (URIBEL) 118 MG CAPS     . ONETOUCH DELICA LANCETS FINE MISC     . oxybutynin (DITROPAN-XL) 5 MG 24 hr tablet Take 5 mg by mouth at bedtime.    . predniSONE (DELTASONE) 20 MG tablet Prednisone 40 mg daily for 5 days. 10 tablet 0  . saccharomyces boulardii (FLORASTOR) 250 MG capsule Take 1 capsule (250 mg total) by mouth 2 (two) times daily.    . VESICARE 5 MG tablet     . Vitamin D, Ergocalciferol, (DRISDOL) 50000 units CAPS capsule Take 1 capsule (50,000 Units total) by mouth every 7 (seven) days. 30 capsule    No current facility-administered medications for this visit.   Facility-Administered Medications Ordered in Other Visits  Medication Dose Route Frequency Provider Last Rate Last Dose  . sodium chloride 0.9 % injection 10 mL  10 mL Intracatheter PRN Curt Bears, MD   10 mL at 12/30/13 1121    SURGICAL HISTORY:  Past Surgical History  Procedure Laterality Date  . Cholecystectomy    . Abdominal hysterectomy    . Dilation and curettage of uterus    . Abdominal surgery    . Tonsillectomy    . Esophagogastroduodenoscopy (egd) with propofol N/A 10/09/2014    Procedure: ESOPHAGOGASTRODUODENOSCOPY (EGD) WITH PROPOFOL;  Surgeon: Inda Castle, MD;  Location: [facility];  Service: Endoscopy;   Laterality: N/A;  . Savory dilation N/A 10/09/2014    Procedure: SAVORY DILATION;  Surgeon: Inda Castle, MD;  Location: [facility];  Service: Endoscopy;  Laterality: N/A;  . Esophagogastroduodenoscopy N/A 12/12/2014    Procedure: ESOPHAGOGASTRODUODENOSCOPY (EGD);  Surgeon: Inda Castle, MD;  Location: [facility];  Service: Endoscopy;  Laterality: N/A;  with dilation  . Balloon dilation N/A 12/12/2014    Procedure: BALLOON DILATION;  Surgeon: Inda Castle, MD;  Location: [facility];  Service: Endoscopy;  Laterality: N/A;  . Orif ankle fracture Right 07/04/2015    Procedure: OPEN REDUCTION INTERNAL FIXATION (ORIF) ANKLE FRACTURE;  Surgeon: Newt Minion, MD;  Location: [facility];  Service: Orthopedics;  Laterality: Right;  OPEN REDUCTION, INTERNAL FIXATION OF RIGHT ANKLE FRACTURE.   . Fracture surgery    . Colonoscopy N/A 02/07/2016    Procedure: COLONOSCOPY;  Surgeon: Nelida Meuse  III, MD;  Location: [facility];  Service: Endoscopy;  Laterality: N/A;  . Orif ankle fracture Left 02/10/2016    Procedure: OPEN REDUCTION INTERNAL FIXATION (ORIF) ANKLE FRACTURE;  Surgeon: Newt Minion, MD;  Location: [facility];  Service: Orthopedics;  Laterality: Left;    REVIEW OF SYSTEMS:  Constitutional: positive for fatigue and weight loss Eyes: negative Ears, nose, mouth, throat, and face: negative Respiratory: negative Cardiovascular: negative Gastrointestinal: negative Genitourinary:negative Integument/breast: positive for rash Hematologic/lymphatic: negative Musculoskeletal:positive for muscle weakness Neurological: negative Behavioral/Psych: negative Endocrine: negative Allergic/Immunologic: negative   PHYSICAL EXAMINATION: General appearance: alert, cooperative, fatigued and no distress Head: Normocephalic, without obvious abnormality, atraumatic Neck: no adenopathy, no JVD, supple, symmetrical, trachea midline and thyroid not enlarged, symmetric, no tenderness/mass/nodules Lymph nodes:  Cervical, supraclavicular, and axillary nodes normal. Resp: clear to auscultation bilaterally Back: symmetric, no curvature. ROM normal. No CVA tenderness. Cardio: regular rate and rhythm, S1, S2 normal, no murmur, click, rub or gallop GI: soft, non-tender; bowel sounds normal; no masses,  no organomegaly Extremities: extremities normal, atraumatic, no cyanosis or edema Neurologic: Alert and oriented X 3, normal strength and tone. Normal symmetric reflexes. Normal coordination and gait  Skin exam: few macular areas of rash on the upper and lower extremities.     ECOG PERFORMANCE STATUS: 1 - Symptomatic but completely ambulatory  Blood pressure 108/68, pulse 116, temperature 97.9 F (36.6 C), temperature source Oral, resp. rate 18, height '5\' 6"'$  (1.676 m), weight 130 lb 4.8 oz (59.104 kg), SpO2 100 %.  LABORATORY DATA: Lab Results  Component Value Date   WBC 7.5 03/01/2016   HGB 11.1* 03/01/2016   HCT 33.8* 03/01/2016   MCV 90.1 03/01/2016   PLT 275 03/01/2016      Chemistry      Component Value Date/Time   NA 136 03/01/2016 0659   NA 136* 03/01/2016   NA 136 12/01/2015 1104   K 4.1 03/01/2016 0659   K 3.5 12/01/2015 1104   CL 105 03/01/2016 0659   CO2 22 03/01/2016 0659   CO2 22 12/01/2015 1104   BUN 12 03/01/2016 0659   BUN 12 03/01/2016   BUN 13.6 12/01/2015 1104   CREATININE 0.61 03/01/2016 0659   CREATININE 0.6 03/01/2016   CREATININE 0.9 12/01/2015 1104   GLU 248 03/01/2016      Component Value Date/Time   CALCIUM 7.6* 03/01/2016 0659   CALCIUM 8.8 12/01/2015 1104   ALKPHOS 82 02/29/2016 0420   ALKPHOS 156* 12/01/2015 1104   AST 8* 02/29/2016 0420   AST 10 12/01/2015 1104   ALT 7* 02/29/2016 0420   ALT 13 12/01/2015 1104   BILITOT 0.5 02/29/2016 0420   BILITOT 0.43 12/01/2015 1104       RADIOGRAPHIC STUDIES: Dg Chest Portable 1 View  02/26/2016  CLINICAL DATA:  Elevated blood sugar. Decreased appetite. History of right middle lobe lung cancer.  Clinical concern for pneumonia. EXAM: PORTABLE CHEST 1 VIEW COMPARISON:  Chest radiograph 02/18/2016.  Chest CT 11/30/2015 FINDINGS: Chronic volume loss in the right lung is unchanged. Rightward mediastinal shift is unchanged. There is no consolidation. The left lung is clear. No pulmonary edema. No pneumothorax or large pleural effusion. Osseous structures appear unchanged. IMPRESSION: No evidence of pneumonia.  Chronic volume loss in the right lung. Electronically Signed   By: Jeb Levering M.D.   On: 02/26/2016 12:35   Portable Chest X-ray (1 View)  02/18/2016  CLINICAL DATA:  Short of breath EXAM: PORTABLE CHEST 1 VIEW COMPARISON:  Radiograph  02/17/2016 FINDINGS: Patient is rotated rightward. There is collapse of the RIGHT middle lobe again demonstrated. Mediastinal shift to the RIGHT. No acute osseous findings. LEFT lung is clear. IMPRESSION: 1. No acute cardiopulmonary findings. 2. Chronic collapse the RIGHT middle lobe. Electronically Signed   By: Suzy Bouchard M.D.   On: 02/18/2016 07:17   Dg Chest Port 1 View  02/17/2016  CLINICAL DATA:  Dyspnea. History of hypertension. Previous right lung cancer. EXAM: PORTABLE CHEST 1 VIEW COMPARISON:  Chest 02/04/2016.  CT chest 11/30/2015 FINDINGS: Unchanged appearance of the chest with chronic volume loss in the right upper and middle lungs associated with mediastinal shift towards the right and hyperinflation of the left lung. No acute consolidation or airspace disease. No blunting of costophrenic angles. No pneumothorax. Heart size and pulmonary vascularity are normal. IMPRESSION: Chronic volume loss in the right middle and upper lungs. No evidence of active pulmonary disease. Electronically Signed   By: Lucienne Capers M.D.   On: 02/17/2016 05:52    ASSESSMENT AND PLAN: This is a very pleasant 75 years old white female with metastatic non-small cell lung cancer, squamous cell carcinoma who is currently undergoing treatment with immunotherapy with  Nivolumab status post 33 cycles and tolerating her treatment fairly well except for recently several episodes of diarrhea concerning to be immune mediated from her treatment. This completely resolved after the patient was treated with prednisone that has been tapered. She has been observation for the last few months. She missed her restaging scan. I will arrange for the patient to have repeat CT scan of the chest this week. For the dehydration, I will repeat CBC and comprehensive metabolic panel today and arrange for the patient to receive 1 L of normal saline. The patient would come back for follow-up visit in 2 weeks for evaluation and discussion of her scan results.  The patient was advised to call immediately if she has any concerning symptoms in the interval. The patient voices understanding of current disease status and treatment options and is in agreement with the current care plan.  All questions were answered. The patient knows to call the clinic with any problems, questions or concerns. We can certainly see the patient much sooner if necessary.  Disclaimer: This note was dictated with voice recognition software. Similar sounding words can inadvertently be transcribed and may not be corrected upon review.

## 2016-03-08 ENCOUNTER — Encounter: Payer: Self-pay | Admitting: Family

## 2016-03-08 ENCOUNTER — Telehealth: Payer: Self-pay | Admitting: Medical Oncology

## 2016-03-08 ENCOUNTER — Non-Acute Institutional Stay (SKILLED_NURSING_FACILITY): Payer: Medicare Other | Admitting: Family

## 2016-03-08 DIAGNOSIS — E46 Unspecified protein-calorie malnutrition: Secondary | ICD-10-CM | POA: Diagnosis not present

## 2016-03-08 DIAGNOSIS — D72829 Elevated white blood cell count, unspecified: Secondary | ICD-10-CM | POA: Diagnosis not present

## 2016-03-08 DIAGNOSIS — K521 Toxic gastroenteritis and colitis: Secondary | ICD-10-CM

## 2016-03-08 NOTE — Progress Notes (Signed)
Location:  [place] Room Number: 1205 Place of Service:  SNF [id number]) Provider:  Marlowe Sax, NP Blanchie Serve, MD   [name], MD  Patient Care Team: Anda Kraft, MD as PCP - General (Endocrinology) Curt Bears, MD as Consulting Physician (Oncology)  Extended Emergency Contact Information Primary Emergency Contact: Beckerdite,George Address: [address]          [place], [place] [address] [place] [employer] Phone: [phone] Relation: Significant other Secondary Emergency Contact: [place] Mobile Phone: [phone] Relation: Friend  Code Status:  Full Code  Goals of care: Advanced Directive information Advanced Directives 03/07/2016  Does patient have an advance directive? Yes  Type of Advance Directive Healthcare Power of Attorney  Copy of advanced directive(s) in chart? No - copy requested     Chief Complaint  Patient presents with  . Acute Visit    Acute Concerns    HPI:  Pt is a 75 y.o. female seen today at [facility] for an acute visit for follow up abnormal lab results. She has a medical history of HTN, RA, GERD, Lung CA, Type 2 DM, Neuropathy, Psoriasis among others. She is seen in her today. She states her current Colitis medication is too expensive and does not get absorbed after she takes it. She has noticed a whole capsule in her stools. She request to see her GI specialist. She has an appointment with Dr. Simona Huh at [place]. Her recent lab results showed Total protein 4.2, Alb 2.58, Ca 7.9 and WBC 15.3. She states had burning with voiding 03/07/2016 but symptoms are much better today. She denies any fever, chills, cough or shortness of breath. Facility staff report no new concerns.    Past Medical History  Diagnosis Date  . Hypertension   . Rheumatoid arthritis ([place])   . GERD (gastroesophageal reflux disease)     no meds for  . Hx of  radiation therapy 12/16/13-01/30/14    lung 66Gy  . Neuropathy ([place])   . Shortness of breath dyspnea     with exertion  . History of hiatal hernia   . Diabetes mellitus     insulin  . Cancer ([place]) dx'd 09/2013    Lung ca  . Malignant neoplasm of right upper lobe of lung ([place]) 11/28/2013  . Psoriasis    Past Surgical History  Procedure Laterality Date  . Cholecystectomy    . Abdominal hysterectomy    . Dilation and curettage of uterus    . Abdominal surgery    . Tonsillectomy    . Esophagogastroduodenoscopy (egd) with propofol N/A 10/09/2014    Procedure: ESOPHAGOGASTRODUODENOSCOPY (EGD) WITH PROPOFOL;  Surgeon: Inda Castle, MD;  Location: [place];  Service: Endoscopy;  Laterality: N/A;  . Savory dilation N/A 10/09/2014    Procedure: SAVORY DILATION;  Surgeon: Inda Castle, MD;  Location: [place];  Service: Endoscopy;  Laterality: N/A;  . Esophagogastroduodenoscopy N/A 12/12/2014    Procedure: ESOPHAGOGASTRODUODENOSCOPY (EGD);  Surgeon: Inda Castle, MD;  Location[facility];  Service: Endoscopy;  Laterality: N/A;  with dilation  . Balloon dilation N/A 12/12/2014    Procedure: BALLOON DILATION;  Surgeon: Inda Castle, MD;  Location: [facility];  Service: Endoscopy;  Laterality: N/A;  . Orif ankle fracture Right 07/04/2015    Procedure: OPEN REDUCTION INTERNAL FIXATION (ORIF) ANKLE FRACTURE;  Surgeon: Newt Minion, MD;  Location: [place];  Service: Orthopedics;  Laterality: Right;  OPEN REDUCTION, INTERNAL FIXATION OF RIGHT ANKLE FRACTURE.   . Fracture surgery    . Colonoscopy N/A 02/07/2016    Procedure: COLONOSCOPY;  Surgeon: Doran Stabler, MD;  Location: [facility] ENDOSCOPY;  Service: Endoscopy;  Laterality: N/A;  . Orif ankle fracture Left 02/10/2016    Procedure: OPEN REDUCTION INTERNAL FIXATION (ORIF) ANKLE FRACTURE;  Surgeon: Newt Minion, MD;  Location: [place];  Service: Orthopedics;  Laterality: Left;    Allergies  Allergen Reactions  . Carboplatin Other  (See Comments)    Neuropathy  . Remicade [Infliximab] Anaphylaxis  . Hydromorphone Rash      Medication List       This list is accurate as of: 03/08/16 11:10 AM.  Always use your most recent med list.               acetaminophen 325 MG tablet  Commonly known as:  TYLENOL  Take 650 mg by mouth every 6 (six) hours as needed for mild pain.     budesonide-formoterol 80-4.5 MCG/ACT inhaler  Commonly known as:  SYMBICORT  Inhale 2 puffs into the lungs 2 (two) times daily.     diphenoxylate-atropine 2.5-0.025 MG tablet  Commonly known as:  LOMOTIL  Take 1 tablet by mouth 4 (four) times daily as needed for diarrhea or loose stools.     fluticasone 0.05 % cream  Commonly known as:  CUTIVATE  Apply cream to rash/psoriasis on sacrum topically twice daily. Apply 1st before Nizoral cream.     gabapentin 300 MG capsule  Commonly known as:  NEURONTIN  Take 1 capsule (300 mg total) by mouth 2 (two) times daily.     insulin lispro 100 UNIT/ML injection  Commonly known as:  HUMALOG  Check CBG inject SSI Sub Q 3 times daily with meals. DM 0-69 = hypoglycemic protocol, 70 -120 = 0 units 121- 150 = 2 units, 151 - 200 = 3 units, 201 - 250 = 5 units, 251 - 300 = 8 units, 301 - 350 = 11 units, 351 - 400 = 15 units > 400 = 15 units Call MD.     ipratropium-albuterol 0.5-2.5 (3) MG/3ML Soln  Commonly known as:  DUONEB  Take 3 mLs by nebulization every 4 (four) hours as needed.     ketoconazole 2 % cream  Commonly known as:  NIZORAL  Apply 1 application topically daily. Apply cream to rash/psoriasis to sacrum topically twice a daily. Apply after Cutivate cream.     LEVEMIR 100 UNIT/ML injection  Generic drug:  insulin detemir  Inject 15 units SQ at bedtime.     Mesalamine 800 MG Tbec  Commonly known as:  ASACOL HD  Take 3 tablets (2,400 mg total) by mouth 2 (two) times daily.     ONETOUCH DELICA LANCETS FINE Misc     oxybutynin 5 MG 24 hr tablet  Commonly known as:  DITROPAN-XL  Take  5 mg by mouth at bedtime.     saccharomyces boulardii 250 MG capsule  Commonly known as:  FLORASTOR  Take 1 capsule (250 mg total) by mouth 2 (two) times daily.     Vitamin D (Ergocalciferol) 50000 units Caps capsule  Commonly known as:  DRISDOL  Take 1 capsule (50,000 Units total) by mouth every 7 (seven) days.        Review of Systems  Constitutional: Negative for fever, chills, activity change, appetite change and fatigue.  HENT: Negative.  Negative for congestion, rhinorrhea, sinus pressure  and sore throat.   Eyes: Negative.  Negative for discharge and visual disturbance.  Respiratory: Negative for cough, chest tightness, shortness of breath and wheezing.   Cardiovascular: Negative for chest pain, palpitations and leg swelling.  Gastrointestinal: Positive for diarrhea. Negative for nausea, vomiting, abdominal pain, constipation and abdominal distention.  Endocrine: Negative for polydipsia, polyphagia and polyuria.  Genitourinary: Negative for dysuria, urgency, frequency and flank pain.  Musculoskeletal: Positive for gait problem.  Skin: Negative.   Neurological: Negative for dizziness, seizures, syncope, weakness, light-headedness and headaches.  Psychiatric/Behavioral: Negative.     Immunization History  Administered Date(s) Administered  . Influenza,inj,Quad PF,36+ Mos 11/07/2013, 07/14/2014, 08/25/2015  . PPD Test 03/02/2016  . Pneumococcal-Unspecified 12/02/2015   Pertinent  Health Maintenance Due  Topic Date Due  . FOOT EXAM  12/06/1950  . OPHTHALMOLOGY EXAM  12/06/1950  . DEXA SCAN  12/05/2006  . MAMMOGRAM  12/11/2015  . INFLUENZA VACCINE  05/31/2016  . HEMOGLOBIN A1C  08/12/2016  . PNA vac Low Risk Adult (2 of 2 - PCV13) 12/01/2016  . COLONOSCOPY  02/06/2026   Fall Risk  12/15/2014 11/24/2014 11/10/2014 10/23/2014 10/13/2014  Falls in the past year? No No No No No  Number falls in past yr: - - - - -  Injury with Fall? No - - No No  Risk for fall due to : -  Impaired balance/gait - Impaired balance/gait;Impaired mobility Impaired balance/gait;Impaired mobility  Risk for fall due to (comments): - - - - -   Functional Status Survey:    Filed Vitals:   03/08/16 1033  BP: 108/66  Pulse: 97  Temp: 97.5 F (36.4 C)  Resp: 16  Height: '5\' 6"'$  (1.676 m)  Weight: 137 lb (62.143 kg)  SpO2: 95%   Body mass index is 22.12 kg/(m^2). Physical Exam  Constitutional: She is oriented to person, place, and time. She appears well-developed and well-nourished. No distress.  HENT:  Head: Normocephalic.  Mouth/Throat: Oropharynx is clear and moist.  Eyes: Conjunctivae and EOM are normal. Pupils are equal, round, and reactive to light. Right eye exhibits no discharge. Left eye exhibits no discharge. No scleral icterus.  Neck: Normal range of motion. No JVD present.  Cardiovascular: Normal rate, regular rhythm, normal heart sounds and intact distal pulses.  Exam reveals no gallop and no friction rub.   No murmur heard. Pulmonary/Chest: Effort normal and breath sounds normal. No respiratory distress. She has no wheezes. She has no rales.  Abdominal: Soft. Bowel sounds are normal. She exhibits no distension. There is no tenderness. There is no rebound and no guarding.  Musculoskeletal: She exhibits no edema or tenderness.  Left leg boot in place.   Lymphadenopathy:    She has no cervical adenopathy.  Neurological: She is oriented to person, place, and time.  Skin: Skin is warm and dry. No erythema.  Psychiatric: She has a normal mood and affect.    Labs reviewed:  Recent Labs  02/19/16 0559 02/20/16 0338  02/26/16 1656  02/28/16 0239 02/29/16 0420  03/01/16 0659 03/07/16 03/07/16 1003  NA  --  139  < > 130*  < > 133* 133*  < > 136 141 138  K  --  3.3*  < > 4.4  < > 4.3 4.4  --  4.1 4.0 3.9  CL  --  103  < > 102  < > 104 105  --  105  --   --   CO2  --  27  < > 18*  < >  20* 21*  --  22  --  26  GLUCOSE  --  160*  < > 202*  < > 153* 352*  --  248*   --  166*  BUN  --  <5*  < > 30*  < > 15 10  < > 12 14 13.7  CREATININE  --  0.68  < > 1.39*  < > 0.89 0.67  < > 0.61 0.7 0.8  CALCIUM  --  7.6*  < > 7.5*  < > 7.6* 7.4*  --  7.6*  --  8.3*  MG 1.7 1.9  --  1.7  --   --   --   --   --   --   --   PHOS 2.1* 2.3*  --  3.6  --   --   --   --   --   --   --   < > = values in this interval not displayed.  Recent Labs  02/28/16 0239 02/29/16 0420 03/07/16 03/07/16 1003  AST 16 8* 7* 8  ALT 8* 7* 8 13  ALKPHOS 78 82 117 117  BILITOT 1.0 0.5  --  0.63  PROT 3.6* 3.6*  --  5.1*  ALBUMIN 1.9* 1.9*  --  2.6*    Recent Labs  02/17/16 0530  02/26/16 1036  02/29/16 0420  03/01/16 0659 03/07/16 03/07/16 1003  WBC 13.9*  < > 20.1*  < > 6.4  < > 7.5 15.3 16.8*  NEUTROABS 10.7*  --  17.5*  --   --   --   --   --  13.4*  HGB 13.2  < > 16.3*  < > 11.5*  --  11.1* 12.6 14.4  HCT 41.5  < > 48.5*  < > 34.1*  --  33.8* 40 44.2  MCV 95.2  < > 88.8  < > 88.8  --  90.1  --  91.1  PLT 589*  < > 355  < > 256  --  275 266 282  < > = values in this interval not displayed. Lab Results  Component Value Date   TSH 8.675* 02/28/2016   Lab Results  Component Value Date   HGBA1C 12.8* 02/11/2016   No results found for: CHOL, HDL, LDLCALC, LDLDIRECT, TRIG, CHOLHDL  Significant Diagnostic Results in last 30 days:  Dg Chest Portable 1 View  02/26/2016  CLINICAL DATA:  Elevated blood sugar. Decreased appetite. History of right middle lobe lung cancer. Clinical concern for pneumonia. EXAM: PORTABLE CHEST 1 VIEW COMPARISON:  Chest radiograph 02/18/2016.  Chest CT 11/30/2015 FINDINGS: Chronic volume loss in the right lung is unchanged. Rightward mediastinal shift is unchanged. There is no consolidation. The left lung is clear. No pulmonary edema. No pneumothorax or large pleural effusion. Osseous structures appear unchanged. IMPRESSION: No evidence of pneumonia.  Chronic volume loss in the right lung. Electronically Signed   By: Jeb Levering M.D.   On:  02/26/2016 12:35   Portable Chest X-ray (1 View)  02/18/2016  CLINICAL DATA:  Short of breath EXAM: PORTABLE CHEST 1 VIEW COMPARISON:  Radiograph 02/17/2016 FINDINGS: Patient is rotated rightward. There is collapse of the RIGHT middle lobe again demonstrated. Mediastinal shift to the RIGHT. No acute osseous findings. LEFT lung is clear. IMPRESSION: 1. No acute cardiopulmonary findings. 2. Chronic collapse the RIGHT middle lobe. Electronically Signed   By: Suzy Bouchard M.D.   On: 02/18/2016 07:17   Dg Chest Port 1 View  02/17/2016  CLINICAL DATA:  Dyspnea. History of hypertension. Previous right lung cancer. EXAM: PORTABLE CHEST 1 VIEW COMPARISON:  Chest 02/04/2016.  CT chest 11/30/2015 FINDINGS: Unchanged appearance of the chest with chronic volume loss in the right upper and middle lungs associated with mediastinal shift towards the right and hyperinflation of the left lung. No acute consolidation or airspace disease. No blunting of costophrenic angles. No pneumothorax. Heart size and pulmonary vascularity are normal. IMPRESSION: Chronic volume loss in the right middle and upper lungs. No evidence of active pulmonary disease. Electronically Signed   By: Lucienne Capers M.D.   On: 02/17/2016 05:52    Assessment/Plan Diarrhea Chronic.Has appointment Dr. Simona Huh at [facility] 03/22/2016.   Protein malnutrition   recent lab results showed Total protein 4.2, Alb 2.58, Ca 7.9 (03/07/2016).Consult RD   Leukocytosis  Afebrile. WBC 15.3 (03/07/2016).Negative for cough.Reports burning with voiding. Obtain Urine specimen for U/A and C/S. Continue to monitor.   Family/ staff Communication: Reviewed plan of care with patient and facility Nurse supervisor   Labs/tests ordered:  None

## 2016-03-08 NOTE — Telephone Encounter (Signed)
I gave number for scheduling to [facility] transportation)  to schedule CT scan

## 2016-03-09 NOTE — Telephone Encounter (Signed)
Unable to reach pt no voice mail set up see last communication   Unable to reach pt by phone letter mailed to have the pt contact our office to discuss results and medication ------  Notes Recorded by Barron Alvine, RN on 02/18/2016 at 3:25 PM Left message on machine to call back ------  Notes Recorded by Barron Alvine, RN on 02/11/2016 at 2:26 PM Left message on machine to call back ------  Notes Recorded by Barron Alvine, RN on 02/10/2016 at 9:17 AM Left message on machine to call back ------  Notes Recorded by Barron Alvine, RN on 02/10/2016 at 9:15 AM 03/22/16 at 9:45 am appt with Dr Loletha Carrow Asacol sent to the pharmacy ------  Notes Recorded by Nelida Meuse III, MD on 02/09/2016 at 5:05 PM Her colon biopsies show a chronic condition called Ulcerative colitis, which has been calling this diarrhea. So it looks like it was not a recent infection.  I would like her to start some new medication to get it under control, and then I would like to see her in about 4 weeks to see how she is feeling  Please prescribe Asacol HD 800 mg , 3 tablets twice daily, Disp #180, RF #2

## 2016-03-10 ENCOUNTER — Ambulatory Visit (INDEPENDENT_AMBULATORY_CARE_PROVIDER_SITE_OTHER): Payer: Medicare Other | Admitting: Internal Medicine

## 2016-03-10 ENCOUNTER — Encounter: Payer: Self-pay | Admitting: Internal Medicine

## 2016-03-10 VITALS — BP 100/62 | HR 98 | Ht 66.0 in | Wt 130.6 lb

## 2016-03-10 DIAGNOSIS — J439 Emphysema, unspecified: Secondary | ICD-10-CM

## 2016-03-10 NOTE — Telephone Encounter (Signed)
Voice mail is full and not accepting any new messages on cell, message left on home number to return call.

## 2016-03-10 NOTE — Progress Notes (Signed)
Subjective:     Patient ID: Megan [name], female   DOB: 19-Feb-1941, 75 y.o.   MRN: [id number]  HPI    OV 03/10/2016  Chief Complaint  Patient presents with  . Follow-up    Pt states her breathing is unchanged since last OV. Pt states she recently fractured her left fibia and has been recent d/c from hsopital. Pt denies cough and CP/tightness.    Rayme Bui Swier follows for her mild emphysema in the setting of advanced lung cancer for which she is on immunotherapy. Reports overall has been complications from chemotherapy and immunotherapy related diabetes, physical deconditioning recent fractures previous neuropathic pain and depression. At this point in time her respiratory status is stable. She's never had exacerbation in a long time for COPD. She is on Symbicort. I asked her if she still wants to continue to follow-up with pulmonary given her mild degree of emphysema. She says that because I was diagnosed lung cancer it is best for her to come and see me every 6 months.     has a past medical history of Hypertension; Rheumatoid arthritis ([place]); GERD (gastroesophageal reflux disease); radiation therapy (12/16/13-01/30/14); Neuropathy ([place]); Shortness of breath dyspnea; History of hiatal hernia; Diabetes mellitus; Cancer ([place]) (dx'd 09/2013); Malignant neoplasm of right upper lobe of lung ([place]) (11/28/2013); and Psoriasis.   reports that she quit smoking about 18 years ago. Her smoking use included Cigarettes. She has a 135 pack-year smoking history. She has never used smokeless tobacco.  Past Surgical History  Procedure Laterality Date  . Cholecystectomy    . Abdominal hysterectomy    . Dilation and curettage of uterus    . Abdominal surgery    . Tonsillectomy    . Esophagogastroduodenoscopy (egd) with propofol N/A 10/09/2014    Procedure: ESOPHAGOGASTRODUODENOSCOPY (EGD) WITH PROPOFOL;  Surgeon: Inda Castle, MD;  Location: [facility] [place];  Service: Endoscopy;  Laterality: N/A;   . Savory dilation N/A 10/09/2014    Procedure: SAVORY DILATION;  Surgeon: Inda Castle, MD;  Location: [place];  Service: Endoscopy;  Laterality: N/A;  . Esophagogastroduodenoscopy N/A 12/12/2014    Procedure: ESOPHAGOGASTRODUODENOSCOPY (EGD);  Surgeon: Inda Castle, MD;  Location: [facility];  Service: Endoscopy;  Laterality: N/A;  with dilation  . Balloon dilation N/A 12/12/2014    Procedure: BALLOON DILATION;  Surgeon: Inda Castle, MD;  [facility];  Service: Endoscopy;  Laterality: N/A;  . Orif ankle fracture Right 07/04/2015    Procedure: OPEN REDUCTION INTERNAL FIXATION (ORIF) ANKLE FRACTURE;  Surgeon: Newt Minion, MD;  Location: [place];  Service: Orthopedics;  Laterality: Right;  OPEN REDUCTION, INTERNAL FIXATION OF RIGHT ANKLE FRACTURE.   . Fracture surgery    . Colonoscopy N/A 02/07/2016    Procedure: COLONOSCOPY;  Surgeon: Doran Stabler, MD;  [facility] ENDOSCOPY;  Service: Endoscopy;  Laterality: N/A;  . Orif ankle fracture Left 02/10/2016    Procedure: OPEN REDUCTION INTERNAL FIXATION (ORIF) ANKLE FRACTURE;  Surgeon: Newt Minion, MD;  Location: [place];  Service: Orthopedics;  Laterality: Left;    Allergies  Allergen Reactions  . Carboplatin Other (See Comments)    Neuropathy  . Remicade [Infliximab] Anaphylaxis  . Hydromorphone Rash    Immunization History  Administered Date(s) Administered  . Influenza,inj,Quad PF,36+ Mos 11/07/2013, 07/14/2014, 08/25/2015  . PPD Test 03/02/2016  . Pneumococcal-Unspecified 12/02/2015    Family History  Problem Relation Age of Onset  . Hypertension Other   . Stroke Other   .  Heart attack Other   . Hemachromatosis Other   . Rheum arthritis      both sets of grandparents and father  . Other Mother   . Other Father     failure to thrive     Current outpatient prescriptions:  .  budesonide-formoterol (SYMBICORT) 80-4.5 MCG/ACT inhaler, Inhale 2 puffs into the lungs 2 (two) times daily. , Disp: , Rfl:   .  fluticasone (CUTIVATE) 0.05 % cream, Apply cream to rash/psoriasis on sacrum topically twice daily. Apply 1st before Nizoral cream., Disp: , Rfl:  .  gabapentin (NEURONTIN) 300 MG capsule, Take 1 capsule (300 mg total) by mouth 2 (two) times daily. (Patient taking differently: Take 300 mg by mouth daily. ), Disp: 30 capsule, Rfl: 0 .  insulin detemir (LEVEMIR) 100 UNIT/ML injection, Inject 15 units SQ at bedtime., Disp: , Rfl:  .  insulin lispro (HUMALOG) 100 UNIT/ML injection, Check CBG inject SSI Sub Q 3 times daily with meals. DM 0-69 = hypoglycemic protocol, 70 -120 = 0 units 121- 150 = 2 units, 151 - 200 = 3 units, 201 - 250 = 5 units, 251 - 300 = 8 units, 301 - 350 = 11 units, 351 - 400 = 15 units > 400 = 15 units Call MD., Disp: , Rfl:  .  ipratropium-albuterol (DUONEB) 0.5-2.5 (3) MG/3ML SOLN, Take 3 mLs by nebulization every 4 (four) hours as needed., Disp: , Rfl:  .  ketoconazole (NIZORAL) 2 % cream, Apply 1 application topically daily. Apply cream to rash/psoriasis to sacrum topically twice a daily. Apply after Cutivate cream., Disp: , Rfl:  .  ONETOUCH DELICA LANCETS FINE MISC, , Disp: , Rfl:  .  Vitamin D, Ergocalciferol, (DRISDOL) 50000 units CAPS capsule, Take 1 capsule (50,000 Units total) by mouth every 7 (seven) days., Disp: 30 capsule, Rfl:  .  oxybutynin (DITROPAN-XL) 5 MG 24 hr tablet, Take 5 mg by mouth at bedtime. Reported on 03/10/2016, Disp: , Rfl:  .  saccharomyces boulardii (FLORASTOR) 250 MG capsule, Take 1 capsule (250 mg total) by mouth 2 (two) times daily. (Patient not taking: Reported on 03/10/2016), Disp: , Rfl:  No current facility-administered medications for this visit.  Facility-Administered Medications Ordered in Other Visits:  .  sodium chloride 0.9 % injection 10 mL, 10 mL, Intracatheter, PRN, Curt Bears, MD, 10 mL at 12/30/13 1121     Review of Systems     Objective:   Physical Exam  Filed Vitals:   03/10/16 1520  BP: 100/62  Pulse: 98   Height: '5\' 6"'$  (1.676 m)  Weight: 130 lb 9.6 oz (59.24 kg)  SpO2: 98%    Deconditioned looking female sitting in a wheelchair. Alert and oriented 3. FLat affect but more cheerful than several weeks ago. Right ankle in boot. Clear to auscultation bilaterally normal heart sounds.      Assessment:       ICD-9-CM ICD-10-CM   1. Pulmonary emphysema, unspecified emphysema type ([place]) 492.8 J43.9    \     Plan:     Continue Symbicort Albuterol as needed   Dr. Brand Males, M.D., [facility].C.P Pulmonary and Critical Care Medicine Staff Physician [facility] Pager: [phone], If no answer or between  15:00h - 7:00h: call [phone]  03/10/2016 3:43 PM

## 2016-03-10 NOTE — Patient Instructions (Addendum)
ICD-9-CM ICD-10-CM   1. Pulmonary emphysema, unspecified emphysema type ([facility]) 492.8 J43.9    Stable disease  Plan Continue symbicort  followup 6 months or sooner if needed

## 2016-03-11 ENCOUNTER — Non-Acute Institutional Stay (SKILLED_NURSING_FACILITY): Payer: Medicare Other | Admitting: Family

## 2016-03-11 ENCOUNTER — Encounter: Payer: Self-pay | Admitting: Family

## 2016-03-11 DIAGNOSIS — S82842S Displaced bimalleolar fracture of left lower leg, sequela: Secondary | ICD-10-CM

## 2016-03-11 DIAGNOSIS — M792 Neuralgia and neuritis, unspecified: Secondary | ICD-10-CM

## 2016-03-11 DIAGNOSIS — E118 Type 2 diabetes mellitus with unspecified complications: Secondary | ICD-10-CM

## 2016-03-11 DIAGNOSIS — C3411 Malignant neoplasm of upper lobe, right bronchus or lung: Secondary | ICD-10-CM

## 2016-03-11 DIAGNOSIS — J439 Emphysema, unspecified: Secondary | ICD-10-CM | POA: Diagnosis not present

## 2016-03-11 DIAGNOSIS — N3000 Acute cystitis without hematuria: Secondary | ICD-10-CM | POA: Diagnosis not present

## 2016-03-11 DIAGNOSIS — E46 Unspecified protein-calorie malnutrition: Secondary | ICD-10-CM | POA: Diagnosis not present

## 2016-03-11 DIAGNOSIS — K5100 Ulcerative (chronic) pancolitis without complications: Secondary | ICD-10-CM

## 2016-03-11 DIAGNOSIS — R269 Unspecified abnormalities of gait and mobility: Secondary | ICD-10-CM

## 2016-03-11 NOTE — Progress Notes (Signed)
Location:  [facility] Room Number: 1205 Place of Service:  SNF (31)  Provider: Marlowe Sax, NP Blanchie Serve, MD  Patient Care Team: No Pcp Per Patient as PCP - General (General Practice) Curt Bears, MD as Consulting Physician (Oncology)  Extended Emergency Contact Information Primary Emergency Contact: Beckerdite,George Address: [address]          [place], [place] [address] [place] [employer] Phone: [phone] Relation: Significant other Secondary Emergency Contact: [place] Mobile Phone: [phone] Relation: Friend  Code Status: Full Code  Goals of care:  Advanced Directive information Advanced Directives 03/07/2016  Does patient have an advance directive? Yes  Type of Advance Directive Healthcare Power of Attorney  Copy of advanced directive(s) in chart? No - copy requested     Allergies  Allergen Reactions  . Carboplatin Other (See Comments)    Neuropathy  . Remicade [Infliximab] Anaphylaxis  . Hydromorphone Rash    Chief Complaint  Patient presents with  . Discharge Note    Discharged from SNF    HPI:  75 y.o. female seen at [facility] for discharge home. She was here for short term rehabilitation post hospital admission from 02/26/16-03/02/16 with DKA and ongoing diarrhea from ulcerative colitis. She also had AKI. She received iv fluids and iv insulin. She was started on prednisone with improvement in her bowel movement.She had a fall and left distal fibula fracture underwent surgical repair.She has been wearing a boot for left leg which was recent discontinued by ortho specialist. She has a past medical history of HTN, Rheumatoid arthritis, GERD, radiation therapy Neuropathy, ulcerative colitis,Type 2  Diabetes mellitus, Malignant neoplasm of right upper lobe of lung, Psoriasis among others.She is seen in her room today.During her stay at the Rehab her  morning blood sugars were in the 30's her bedtime Lantus was reduced. Her CBG's  Are now ranging in the 200's. Her recent urine specimen lab results showed nitrite positive, small leukocytes. Urine culture had > 100,000 colonies of Klebsiella Pneumoniae. She denies any fever or chills. She has worked with PT/OT now stable for discharge home. She will be discharge with Home health PT/OT to continue with ROM, Exercise, gait stability and Muscle strengthening. She will require a home health Aid to assist with ADL's. She does not require any DME states has own wheelchair and walker at home. All prescription medication will be written for one month supply. Cipro 500 mg Tablet Twice daily for seven days prescription for UTI written. Home Health Service will be arranged by facility social worker prior to discharge home.    Past Medical History  Diagnosis Date  . Hypertension   . Rheumatoid arthritis ([place])   . GERD (gastroesophageal reflux disease)     no meds for  . Hx of radiation therapy 12/16/13-01/30/14    lung 66Gy  . Neuropathy ([place])   . Shortness of breath dyspnea     with exertion  . History of hiatal hernia   . Diabetes mellitus     insulin  . Cancer ([place]) dx'd 09/2013    Lung ca  . Malignant neoplasm of right upper lobe of lung ([place]) 11/28/2013  . Psoriasis     Past Surgical History  Procedure Laterality Date  . Cholecystectomy    . Abdominal hysterectomy    . Dilation and curettage of uterus    . Abdominal surgery    . Tonsillectomy    .  Esophagogastroduodenoscopy (egd) with propofol N/A 10/09/2014    Procedure: ESOPHAGOGASTRODUODENOSCOPY (EGD) WITH PROPOFOL;  Surgeon: Inda Castle, MD;  Location: [facility];  Service: Endoscopy;  Laterality: N/A;  . Savory dilation N/A 10/09/2014    Procedure: SAVORY DILATION;  Surgeon: Inda Castle, MD;  Location: [facility];  Service: Endoscopy;  Laterality: N/A;  . Esophagogastroduodenoscopy N/A 12/12/2014    Procedure:  ESOPHAGOGASTRODUODENOSCOPY (EGD);  Surgeon: Inda Castle, MD;  Location: [facility];  Service: Endoscopy;  Laterality: N/A;  with dilation  . Balloon dilation N/A 12/12/2014    Procedure: BALLOON DILATION;  Surgeon: Inda Castle, MD;  Location: [facility];  Service: Endoscopy;  Laterality: N/A;  . Orif ankle fracture Right 07/04/2015    Procedure: OPEN REDUCTION INTERNAL FIXATION (ORIF) ANKLE FRACTURE;  Surgeon: Newt Minion, MD;  Location: [facility];  Service: Orthopedics;  Laterality: Right;  OPEN REDUCTION, INTERNAL FIXATION OF RIGHT ANKLE FRACTURE.   . Fracture surgery    . Colonoscopy N/A 02/07/2016    Procedure: COLONOSCOPY;  Surgeon: Doran Stabler, MD;  Location: [facility];  Service: Endoscopy;  Laterality: N/A;  . Orif ankle fracture Left 02/10/2016    Procedure: OPEN REDUCTION INTERNAL FIXATION (ORIF) ANKLE FRACTURE;  Surgeon: Newt Minion, MD;  Location: [facility];  Service: Orthopedics;  Laterality: Left;      reports that she quit smoking about 18 years ago. Her smoking use included Cigarettes. She has a 135 pack-year smoking history. She has never used smokeless tobacco. She reports that she does not drink alcohol or use illicit drugs. Social History   Social History  . Marital Status: Divorced    Spouse Name: N/A  . Number of Children: 0  . Years of Education: MA   Occupational History  . Retired Other   Social History Main Topics  . Smoking status: Former Smoker -- 3.00 packs/day for 45 years    Types: Cigarettes    Quit date: 12/05/1997  . Smokeless tobacco: Never Used  . Alcohol Use: No  . Drug Use: No  . Sexual Activity: Not Currently   Other Topics Concern  . Not on file   Social History Narrative   Patient lives at home alone.   Caffeine Use: 2 cups daily   Functional Status Survey:    Allergies  Allergen Reactions  . Carboplatin Other (See Comments)    Neuropathy  . Remicade [Infliximab] Anaphylaxis  . Hydromorphone Rash    Pertinent   Health Maintenance Due  Topic Date Due  . FOOT EXAM  12/06/1950  . OPHTHALMOLOGY EXAM  12/06/1950  . URINE MICROALBUMIN  12/06/1950  . DEXA SCAN  12/05/2006  . MAMMOGRAM  12/11/2015  . INFLUENZA VACCINE  05/31/2016  . HEMOGLOBIN A1C  08/12/2016  . PNA vac Low Risk Adult (2 of 2 - PCV13) 12/01/2016  . COLONOSCOPY  02/06/2026    Medications:   Medication List       This list is accurate as of: 03/11/16 10:05 AM.  Always use your most recent med list.               acetaminophen 325 MG tablet  Commonly known as:  TYLENOL  Take 650 mg by mouth every 6 (six) hours as needed for mild pain.     budesonide-formoterol 80-4.5 MCG/ACT inhaler  Commonly known as:  SYMBICORT  Inhale 2 puffs into the lungs 2 (two) times daily.     diphenoxylate-atropine 2.5-0.025 MG tablet  Commonly known as:  LOMOTIL  Take 1 tablet by mouth every 4 (four) hours as needed for diarrhea or loose stools.     fluticasone 0.05 % cream  Commonly known as:  CUTIVATE  Apply cream to rash/psoriasis on sacrum topically twice daily. Apply 1st before Nizoral cream.     gabapentin 300 MG capsule  Commonly known as:  NEURONTIN  Take 300 mg by mouth 2 (two) times daily.     insulin lispro 100 UNIT/ML injection  Commonly known as:  HUMALOG  Check CBG inject SSI Sub Q 3 times daily with meals. DM 0-69 = hypoglycemic protocol, 70 -120 = 0 units 121- 150 = 2 units, 151 - 200 = 3 units, 201 - 250 = 5 units, 251 - 300 = 8 units, 301 - 350 = 11 units, 351 - 400 = 15 units > 400 = 15 units Call MD.     ipratropium-albuterol 0.5-2.5 (3) MG/3ML Soln  Commonly known as:  DUONEB  Take 3 mLs by nebulization every 4 (four) hours as needed.     ketoconazole 2 % cream  Commonly known as:  NIZORAL  Apply 1 application topically daily. Apply cream to rash/psoriasis to sacrum topically twice a daily. Apply after Cutivate cream.     LEVEMIR 100 UNIT/ML injection  Generic drug:  insulin detemir  Inject 15 units SQ at bedtime.      Mesalamine 800 MG Tbec  Take 3 tablets = 2400 mg by mouth twice daily     ONETOUCH DELICA LANCETS FINE Misc     oxybutynin 5 MG 24 hr tablet  Commonly known as:  DITROPAN-XL  Take 5 mg by mouth at bedtime. Reported on 03/10/2016     PROSTAT PO  Take 30 mLs by mouth 2 (two) times daily.     saccharomyces boulardii 250 MG capsule  Commonly known as:  FLORASTOR  Take 250 mg by mouth 2 (two) times daily.     Vitamin D (Ergocalciferol) 50000 units Caps capsule  Commonly known as:  DRISDOL  Take 1 capsule (50,000 Units total) by mouth every 7 (seven) days.        Review of Systems  Constitutional: Negative for fever, chills, activity change, appetite change and fatigue.  HENT: Negative.   Eyes: Negative.   Respiratory: Negative for cough, chest tightness, shortness of breath and wheezing.   Cardiovascular: Negative.   Gastrointestinal: Negative for nausea, vomiting, abdominal pain, constipation and abdominal distention.  Endocrine: Negative.   Genitourinary: Negative for dysuria, urgency and flank pain.  Musculoskeletal: Positive for gait problem.  Skin: Negative.   Neurological: Negative.   Psychiatric/Behavioral: Negative.     Filed Vitals:   03/11/16 0940  BP: 111/57  Pulse: 81  Temp: 97.2 F (36.2 C)  Resp: 21  Height: '5\' 6"'$  (1.676 m)  Weight: 134 lb 3.2 oz (60.873 kg)  SpO2: 99%   Body mass index is 21.67 kg/(m^2). Physical Exam  Constitutional: She is oriented to person, place, and time. She appears well-developed and well-nourished. No distress.  HENT:  Head: Normocephalic.  Mouth/Throat: Oropharynx is clear and moist. No oropharyngeal exudate.  Eyes: Conjunctivae and EOM are normal. Pupils are equal, round, and reactive to light. Right eye exhibits no discharge. Left eye exhibits no discharge. No scleral icterus.  Neck: Normal range of motion. No JVD present.  Cardiovascular: Normal rate, regular rhythm, normal heart sounds and intact distal pulses.   Exam reveals no gallop and no friction rub.   No murmur heard. Pulmonary/Chest: Effort normal  and breath sounds normal. No respiratory distress. She has no wheezes. She has no rales.  Abdominal: Soft. Bowel sounds are normal. She exhibits no distension. There is no tenderness. There is no rebound and no guarding.  Musculoskeletal: She exhibits no edema or tenderness.  Normal ROM except left leg limited due to pain.   Lymphadenopathy:    She has no cervical adenopathy.  Neurological: She is oriented to person, place, and time.  Skin: Skin is warm and dry. No rash noted. No erythema. No pallor.  Psychiatric: She has a normal mood and affect.    Labs reviewed: Basic Metabolic Panel:  Recent Labs  02/19/16 0559 02/20/16 0338  02/26/16 1656  02/28/16 0239 02/29/16 0420  03/01/16 0659 03/07/16 03/07/16 1003  NA  --  139  < > 130*  < > 133* 133*  < > 136 141 138  K  --  3.3*  < > 4.4  < > 4.3 4.4  --  4.1 4.0 3.9  CL  --  103  < > 102  < > 104 105  --  105  --   --   CO2  --  27  < > 18*  < > 20* 21*  --  22  --  26  GLUCOSE  --  160*  < > 202*  < > 153* 352*  --  248*  --  166*  BUN  --  <5*  < > 30*  < > 15 10  < > 12 14 13.7  CREATININE  --  0.68  < > 1.39*  < > 0.89 0.67  < > 0.61 0.7 0.8  CALCIUM  --  7.6*  < > 7.5*  < > 7.6* 7.4*  --  7.6*  --  8.3*  MG 1.7 1.9  --  1.7  --   --   --   --   --   --   --   PHOS 2.1* 2.3*  --  3.6  --   --   --   --   --   --   --   < > = values in this interval not displayed. Liver Function Tests:  Recent Labs  02/28/16 0239 02/29/16 0420 03/07/16 03/07/16 1003  AST 16 8* 7* 8  ALT 8* 7* 8 13  ALKPHOS 78 82 117 117  BILITOT 1.0 0.5  --  0.63  PROT 3.6* 3.6*  --  5.1*  ALBUMIN 1.9* 1.9*  --  2.6*    Recent Labs  02/17/16 0644 02/26/16 1213  LIPASE 14 18   No results for input(s): AMMONIA in the last 8760 hours. CBC:  Recent Labs  02/17/16 0530  02/26/16 1036  02/29/16 0420  03/01/16 0659 03/07/16 03/07/16 1003  WBC  13.9*  < > 20.1*  < > 6.4  < > 7.5 15.3 16.8*  NEUTROABS 10.7*  --  17.5*  --   --   --   --   --  13.4*  HGB 13.2  < > 16.3*  < > 11.5*  --  11.1* 12.6 14.4  HCT 41.5  < > 48.5*  < > 34.1*  --  33.8* 40 44.2  MCV 95.2  < > 88.8  < > 88.8  --  90.1  --  91.1  PLT 589*  < > 355  < > 256  --  275 266 282  < > = values in this interval not displayed. Cardiac Enzymes:  Recent Labs  02/17/16 1320 02/17/16 1957 02/18/16 0755  TROPONINI 0.04* 0.07* 0.06*   BNP: Invalid input(s): POCBNP CBG:  Recent Labs  03/01/16 2131 03/02/16 0847 03/02/16 1242  GLUCAP 234* 157* 112*    Procedures and Imaging Studies During Stay: Dg Chest Portable 1 View  02/26/2016  CLINICAL DATA:  Elevated blood sugar. Decreased appetite. History of right middle lobe lung cancer. Clinical concern for pneumonia. EXAM: PORTABLE CHEST 1 VIEW COMPARISON:  Chest radiograph 02/18/2016.  Chest CT 11/30/2015 FINDINGS: Chronic volume loss in the right lung is unchanged. Rightward mediastinal shift is unchanged. There is no consolidation. The left lung is clear. No pulmonary edema. No pneumothorax or large pleural effusion. Osseous structures appear unchanged. IMPRESSION: No evidence of pneumonia.  Chronic volume loss in the right lung. Electronically Signed   By: Jeb Levering M.D.   On: 02/26/2016 12:35   Portable Chest X-ray (1 View)  02/18/2016  CLINICAL DATA:  Short of breath EXAM: PORTABLE CHEST 1 VIEW COMPARISON:  Radiograph 02/17/2016 FINDINGS: Patient is rotated rightward. There is collapse of the RIGHT middle lobe again demonstrated. Mediastinal shift to the RIGHT. No acute osseous findings. LEFT lung is clear. IMPRESSION: 1. No acute cardiopulmonary findings. 2. Chronic collapse the RIGHT middle lobe. Electronically Signed   By: Suzy Bouchard M.D.   On: 02/18/2016 07:17   Dg Chest Port 1 View  02/17/2016  CLINICAL DATA:  Dyspnea. History of hypertension. Previous right lung cancer. EXAM: PORTABLE CHEST 1 VIEW  COMPARISON:  Chest 02/04/2016.  CT chest 11/30/2015 FINDINGS: Unchanged appearance of the chest with chronic volume loss in the right upper and middle lungs associated with mediastinal shift towards the right and hyperinflation of the left lung. No acute consolidation or airspace disease. No blunting of costophrenic angles. No pneumothorax. Heart size and pulmonary vascularity are normal. IMPRESSION: Chronic volume loss in the right middle and upper lungs. No evidence of active pulmonary disease. Electronically Signed   By: Lucienne Capers M.D.   On: 02/17/2016 05:52    Assessment/Plan:    Urinary Tract Infection  Afebrile. Her recent urine specimen lab results showed nitrite positive, small leukocytes. Urine culture had > 100,000 colonies of Klebsiella Pneumoniae. Cipro 500 mg Tablet Twice daily for seven days prescription for UTI written.  Type 2 DM Recent Hospital admission DKA.During her stay at the Rehab her morning blood sugars were in the 30's her bedtime Lantus was reduced to 15 units. Her CBG's  are now ranging in the 200's.Continue Lantus 15 units SQ and Humalog per SSI. Follow up with PCP to monitor Hgb A1C  Left distal Fibula fracture   S/p surgical reapir,  left foot boot recent discontinued by [facility].WBAT.Pain under controlled.She will be discharge with Home health PT/OT to continue with ROM, Exercise, gait stability and Muscle strengthening. She will require a home health Aid to assist with ADL's. She does not require any DME states has own wheelchair and walker at home. Follow up with Dr Sharol Given as directed.   Ulcerative colitis Stable.Completed prednisone 40 mg daily 03/07/16.Mesalamine discontinued due to insurance coverage too expensive. Continue on  lomotil as needed for loose stool.   Emphysema  Stable. Continue on Duonebs and Symbicort. Seen 03/11/2016 by Dr. Chase Caller at [facility] patient to follow up in 6 months.   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 On immunotherapy.Continue Duonebs and Symbicort.  Neuropathic pain Secondary to chemotherapy drugs. Continue neurontin 300 mg bid  Protein calorie malnutrition Continue on protein supplement.Encourage oral oral intake. Follow up with PCP to monitor BMP in 1-2 weeks  Abnormal Gait  Has worked with PT/OT. She will be discharge with Home health PT/OT to continue with ROM, Exercise, gait stability and Muscle strengthening. She will require a home health Aid to assist with ADL's. She does not require any DME states has own wheelchair and walker at home.Fall and safety precaution.      Patient is being discharged with the following home health services:   PT/OT to continue with ROM, Exercise, gait stability and Muscle strengthening.  Home health Aid to assist with ADL's.  Patient is being discharged with the following durable medical equipment:   She does not require any DME states has own wheelchair and walker at home.  All prescription medication will be written for one month supply.  Patient has been advised to f/u with their PCP in 1-2 weeks to bring them up to date on their rehab stay.  Social services at facility was responsible for arranging this appointment.  Pt was provided with a 30 day supply of prescriptions for medications and refills must be obtained from their PCP.  For controlled substances, a more limited supply may be provided adequate until PCP appointment only.  Future labs/tests needed:  CBC, BMP in 1-2 weeks with PCP

## 2016-03-14 ENCOUNTER — Ambulatory Visit (HOSPITAL_COMMUNITY)
Admission: RE | Admit: 2016-03-14 | Discharge: 2016-03-14 | Disposition: A | Discharge status: Home / Self Care | Payer: Medicare Other | Source: Ambulatory Visit | Attending: Internal Medicine | Admitting: Internal Medicine

## 2016-03-14 ENCOUNTER — Encounter (HOSPITAL_COMMUNITY): Payer: Self-pay

## 2016-03-14 DIAGNOSIS — I7090 Unspecified atherosclerosis: Secondary | ICD-10-CM | POA: Diagnosis not present

## 2016-03-14 DIAGNOSIS — C3411 Malignant neoplasm of upper lobe, right bronchus or lung: Secondary | ICD-10-CM | POA: Insufficient documentation

## 2016-03-14 DIAGNOSIS — I2510 Atherosclerotic heart disease of native coronary artery without angina pectoris: Secondary | ICD-10-CM | POA: Insufficient documentation

## 2016-03-14 DIAGNOSIS — Z983 Post therapeutic collapse of lung status: Secondary | ICD-10-CM | POA: Insufficient documentation

## 2016-03-14 DIAGNOSIS — J90 Pleural effusion, not elsewhere classified: Secondary | ICD-10-CM | POA: Diagnosis not present

## 2016-03-14 DIAGNOSIS — Z9221 Personal history of antineoplastic chemotherapy: Secondary | ICD-10-CM | POA: Insufficient documentation

## 2016-03-14 DIAGNOSIS — C342 Malignant neoplasm of middle lobe, bronchus or lung: Secondary | ICD-10-CM | POA: Diagnosis present

## 2016-03-14 DIAGNOSIS — Z5112 Encounter for antineoplastic immunotherapy: Secondary | ICD-10-CM

## 2016-03-14 MED ORDER — IOPAMIDOL (ISOVUE-300) INJECTION 61%
75.0000 mL | Freq: Once | INTRAVENOUS | Status: AC | PRN
  Administered 2016-03-14: 75 mL via INTRAVENOUS

## 2016-03-14 NOTE — Telephone Encounter (Signed)
Unable to reach pt all available numbers, letter previously mailed to have the pt contact us and provide a good working number

## 2016-03-15 DIAGNOSIS — K5100 Ulcerative (chronic) pancolitis without complications: Secondary | ICD-10-CM | POA: Diagnosis not present

## 2016-03-15 DIAGNOSIS — J439 Emphysema, unspecified: Secondary | ICD-10-CM | POA: Diagnosis not present

## 2016-03-15 DIAGNOSIS — M545 Low back pain: Secondary | ICD-10-CM | POA: Diagnosis not present

## 2016-03-15 DIAGNOSIS — E119 Type 2 diabetes mellitus without complications: Secondary | ICD-10-CM | POA: Diagnosis not present

## 2016-03-15 DIAGNOSIS — C3411 Malignant neoplasm of upper lobe, right bronchus or lung: Secondary | ICD-10-CM | POA: Diagnosis not present

## 2016-03-15 DIAGNOSIS — S82832D Other fracture of upper and lower end of left fibula, subsequent encounter for closed fracture with routine healing: Secondary | ICD-10-CM | POA: Diagnosis not present

## 2016-03-22 ENCOUNTER — Ambulatory Visit (INDEPENDENT_AMBULATORY_CARE_PROVIDER_SITE_OTHER): Payer: Medicare Other | Admitting: Gastroenterology

## 2016-03-22 ENCOUNTER — Encounter: Payer: Self-pay | Admitting: Gastroenterology

## 2016-03-22 ENCOUNTER — Ambulatory Visit: Payer: Medicare Other | Admitting: Interventional Cardiology

## 2016-03-22 VITALS — BP 108/68 | HR 103 | Ht 66.0 in | Wt 137.0 lb

## 2016-03-22 DIAGNOSIS — R197 Diarrhea, unspecified: Secondary | ICD-10-CM

## 2016-03-22 DIAGNOSIS — K5100 Ulcerative (chronic) pancolitis without complications: Secondary | ICD-10-CM

## 2016-03-22 MED ORDER — BUDESONIDE 9 MG PO TB24: 9.0000 mg | ORAL_TABLET | Freq: Every day | ORAL | Status: DC

## 2016-03-22 NOTE — Patient Instructions (Signed)
You have been scheduled for an appointment with PA Jonni Sanger at [facility]. Your appointment is on 03-23-2016 at 130pm. Please arrive at 1pm for registration. Make certain to bring a list of current medications, including any over the counter medications or vitamins. Also bring your co-pay if you have one as well as your insurance cards. [facility] is located at [address]. Should you need to reschedule your appointment, please contact them at [phone].  If you are age 60 or older, your body mass index should be between 23-30. Your Body mass index is 22.12 kg/(m^2). If this is out of the aforementioned range listed, please consider follow up with your Primary Care Provider.  If you are age 46 or younger, your body mass index should be between 19-25. Your Body mass index is 22.12 kg/(m^2). If this is out of the aformentioned range listed, please consider follow up with your Primary Care Provider.   Thank you for choosing [facility]  Dr Wilfrid Lund III

## 2016-03-22 NOTE — Progress Notes (Signed)
[facility] Progress Note  Chief Complaint: Diarrhea and colitis  Subjective History:  This is office follow-up for a 75 year old woman I met during hospital consult about 6 weeks ago. She had an acute worsening of chronic diarrhea that she had attributed to her immunotherapy for malignancy. Colonoscopy showed mild pancolitis consistent with UC. Biopsies also confirmed this. I prescribed Asacol HD, she says it was not helping very much, and she felt that most of the tablets or passing right through her into the toilet. She seems to feel like the problem has gotten somewhat better on its own, but she also told me that when I first saw her. Her husband seems to disagree, and says that she has loose stool most days and sometimes has accidents because she cannot make to the bathroom on time. This is made worse by her limited mobility from an ankle fracture 2 months ago. She was initially having rectal bleeding explained by internal hemorrhoids seen on colonoscopy, none since the recent hospital discharge. As an aside, she is complaining of it painful "boil" on her sacrum that she noticed about 2 days ago. It seems to be getting worse and is painful to sit.  ROS: Cardiovascular:  no chest pain Respiratory: no dyspnea  The patient's Past Medical, Family and Social History were reviewed and are on file in the EMR.  Objective:  Med list reviewed  Vital signs in last 24 hrs: Filed Vitals:   03/22/16 0957  BP: 108/68  Pulse: 103    Physical Exam Chronically ill-appearing elderly woman, ambulates with some difficulty and assistance.  HEENT: sclera anicteric, oral mucosa moist without lesions  Neck: supple, no thyromegaly, JVD or lymphadenopathy  Cardiac: RRR without murmurs, S1S2 heard, no peripheral edema  Pulm: clear to auscultation bilaterally, normal RR and effort noted  Abdomen: soft, no tenderness, with active bowel sounds. No guarding or palpable hepatosplenomegaly.  Skin; warm  and dry, no jaundice or rash Furuncle on sacrum - tender, with some fullness below the skin. Mild surrounding erythema.  Recent Labs:  Colon biopsies as noted above  '@ASSESSMENTPLANBEGIN' @ Assessment: And plan Encounter Diagnoses  Name Primary?  . Ulcerative pancolitis without complication ([place]) Yes  . Diarrhea, unspecified type    I had hoped to change her to Lialda hoping for better absorption and effect, but I showed her one of the tablets and she says it is much too large to swallow. I prescribed desonide, I'm hopeful it is affordable. If not, we may need to make an appeal to her insurance because she is unable to swallow pills the size of sulfasalazine or Lialda.  2 Imodium tablets every morning and 2 in the early afternoon if she has continued diarrhea.  Sacral infected skin lesion - we have obtained her an appointment with the surgical group tomorrow for incision and drainage. Follow-up in 3 months or sooner as needed.  Total 25 minutes time, over half spent reviewing the nature of ulcerative colitis its available treatments and management of her skin lesion.   Nelida Meuse III

## 2016-03-23 ENCOUNTER — Ambulatory Visit (INDEPENDENT_AMBULATORY_CARE_PROVIDER_SITE_OTHER): Payer: Medicare Other | Admitting: Cardiovascular Disease

## 2016-03-23 ENCOUNTER — Encounter: Payer: Self-pay | Admitting: Cardiovascular Disease

## 2016-03-23 VITALS — BP 112/64 | HR 104 | Ht 66.0 in | Wt 135.4 lb

## 2016-03-23 DIAGNOSIS — R55 Syncope and collapse: Secondary | ICD-10-CM | POA: Diagnosis not present

## 2016-03-23 NOTE — Patient Instructions (Signed)
Your physician recommends that you continue on your current medications as directed. Please refer to the Current Medication list given to you today.  Dr Sallyanne Kuster recommends that you schedule a follow-up appointment in 6 months. You will receive a reminder letter in the mail two months in advance. If you don't receive a letter, please call our office to schedule the follow-up appointment.  If you need a refill on your cardiac medications before your next appointment, please call your pharmacy.

## 2016-03-24 ENCOUNTER — Telehealth: Payer: Self-pay | Admitting: Gastroenterology

## 2016-03-25 DIAGNOSIS — R55 Syncope and collapse: Secondary | ICD-10-CM | POA: Insufficient documentation

## 2016-03-25 NOTE — Progress Notes (Signed)
Patient ID: Megan [name], female   DOB: 1940/11/02, 75 y.o.   MRN: [id number]    Cardiology Office Note    Date:  03/25/2016   ID:  Megan [name], DOB 05/08/1941, MRN [id number]  PCP:  No PCP Per Patient  Cardiologist:   Sanda Klein, MD   Chief Complaint  Patient presents with  . Follow-up    event monitor  post ED--several times in April  pt c/o dull "twinge" in chest yesterday; SOB on exertion; swelling in legs/feet/ankles--broke her left foot because of Syncope    History of Present Illness:  Megan [name] is a 75 y.o. female recently hospitalized with an episode of syncope. This occurred in the setting of diabetic ketoacidosis and dehydration related to diarrhea. She has been receiving immunotherapy for lung cancer, complicated by newly developed psoriasis and colitis. Her right lung squamous cell carcinoma does appear to respond to treatment, unfortunately the treatment has been associated with multiple complications.  Following that episode she was discharged with an event monitor which she only wore for a brief period of time. The monitor only shows sinus rhythm and brief sinus tachycardia, likely physiological. She has not had any new episodes of syncope. She has an occasional "twinge" in her chest that is not associated with activity, but rather with movement. She appears very frail, with her BMI just over 21. She does have complaints of neuropathy involving her hands and feet and has unsteadiness in her gait because of this. She is receiving physical therapy through the end of June.  Interestingly, she is very lean and she only developed diabetes roughly 5 years ago. She is on insulin. She does not have any other coronary risk factors, specifically does not have hypertension, hypercholesterolemia, family history of coronary artery disease and she would smoking almost 20 years ago (history of 45 pack year smoking, quit 1999). Multiple chest CTs have shown very  little atherosclerotic the position of calcium in her coronary distribution. Her echocardiogram in January 2017 was essentially normal study. He was tachycardic event.   Past Medical History  Diagnosis Date  . Hypertension   . Rheumatoid arthritis ([place])   . GERD (gastroesophageal reflux disease)     no meds for  . Hx of radiation therapy 12/16/13-01/30/14    lung 66Gy  . Neuropathy ([place])   . Shortness of breath dyspnea     with exertion  . History of hiatal hernia   . Diabetes mellitus     insulin  . Psoriasis   . Cancer ([place]) dx'd 09/2013    Lung ca  . Malignant neoplasm of right upper lobe of lung ([place]) 11/28/2013    Past Surgical History  Procedure Laterality Date  . Cholecystectomy    . Abdominal hysterectomy    . Dilation and curettage of uterus    . Abdominal surgery    . Tonsillectomy    . Esophagogastroduodenoscopy (egd) with propofol N/A 10/09/2014    Procedure: ESOPHAGOGASTRODUODENOSCOPY (EGD) WITH PROPOFOL;  Surgeon: Inda Castle, MD;  Location: [place];  Service: Endoscopy;  Laterality: N/A;  . Savory dilation N/A 10/09/2014    Procedure: SAVORY DILATION;  Surgeon: Inda Castle, MD;  Location: [place];  Service: Endoscopy;  Laterality: N/A;  . Esophagogastroduodenoscopy N/A 12/12/2014    Procedure: ESOPHAGOGASTRODUODENOSCOPY (EGD);  Surgeon: Inda Castle, MD;  Location: [facility];  Service: Endoscopy;  Laterality: N/A;  with dilation  . Balloon dilation N/A 12/12/2014    Procedure: BALLOON DILATION;  Surgeon: Inda Castle, MD;  Location: [facility];  Service: Endoscopy;  Laterality: N/A;  . Orif ankle fracture Right 07/04/2015    Procedure: OPEN REDUCTION INTERNAL FIXATION (ORIF) ANKLE FRACTURE;  Surgeon: Newt Minion, MD;  Location: [place];  Service: Orthopedics;  Laterality: Right;  OPEN REDUCTION, INTERNAL FIXATION OF RIGHT ANKLE FRACTURE.   . Fracture surgery    . Colonoscopy N/A 02/07/2016    Procedure: COLONOSCOPY;  Surgeon: Doran Stabler, MD;  Location[facility] END[facility]COPY;  Service: Endoscopy;  Laterality: N/A;  . Orif ankle fracture Left 02/10/2016    Procedure: OPEN REDUCTION INTERNAL FIXATION (ORIF) ANKLE FRACTURE;  Surgeon: Newt Minion, MD;  Location: [place];  Service: Orthopedics;  Laterality: Left;    Current Medications: Outpatient Prescriptions Prior to Visit  Medication Sig Dispense Refill  . Budesonide (UCERIS) 9 MG TB24 Take 9 mg by mouth daily. 30 tablet 3  . fluticasone (CUTIVATE) 0.05 % cream Apply cream to rash/psoriasis on sacrum topically twice daily. Apply 1st before Nizoral cream.    . insulin detemir (LEVEMIR) 100 UNIT/ML injection Inject 15 units SQ at bedtime.    . insulin lispro (HUMALOG) 100 UNIT/ML injection Check CBG inject SSI Sub Q 3 times daily with meals. DM 0-69 = hypoglycemic protocol, 70 -120 = 0 units 121- 150 = 2 units, 151 - 200 = 3 units, 201 - 250 = 5 units, 251 - 300 = 8 units, 301 - 350 = 11 units, 351 - 400 = 15 units > 400 = 15 units Call MD.    . ketoconazole (NIZORAL) 2 % cream Apply 1 application topically daily. Apply cream to rash/psoriasis to sacrum topically twice a daily. Apply after Cutivate cream.    . Vitamin D, Ergocalciferol, (DRISDOL) 50000 units CAPS capsule Take 1 capsule (50,000 Units total) by mouth every 7 (seven) days. 30 capsule   . gabapentin (NEURONTIN) 300 MG capsule Take 300 mg by mouth 2 (two) times daily.    [name] ipratropium-albuterol (DUONEB) 0.5-2.5 (3) MG/3ML SOLN Take 3 mLs by nebulization every 4 (four) hours as needed.    [name] oxybutynin (DITROPAN-XL) 5 MG 24 hr tablet Take 5 mg by mouth at bedtime. Reported on 03/10/2016    . Pollen Extracts (PROSTAT PO) Take 30 mLs by mouth 2 (two) times daily.     Facility-Administered Medications Prior to Visit  Medication Dose Route Frequency Provider Last Rate Last Dose  . sodium chloride 0.9 % injection 10 mL  10 mL Intracatheter PRN Curt Bears, MD   10 mL at 12/30/13 1121     Allergies:   Carboplatin; Remicade; and  Hydromorphone   Social History   Social History  . Marital Status: Divorced    Spouse Name: N/A  . Number of Children: 0  . Years of Education: MA   Occupational History  . Retired Other   Social History Main Topics  . Smoking status: Former Smoker -- 3.00 packs/day for 45 years    Types: Cigarettes    Quit date: 12/05/1997  . Smokeless tobacco: Never Used  . Alcohol Use: No  . Drug Use: No  . Sexual Activity: Not Currently   Other Topics Concern  . None   Social History Narrative   Patient lives at home alone.   Caffeine Use: 2 cups daily     Family History:  The patient's family history includes Heart attack in her other; Hemachromatosis in her other; Hypertension in her other; Other in her father and  mother; Stroke in her other.   ROS:   Please see the history of present illness.    ROS All other systems reviewed and are negative.   PHYSICAL EXAM:   VS:  BP 112/64 mmHg  Pulse 104  Ht '5\' 6"'$  (1.676 m)  Wt 61.417 kg (135 lb 6.4 oz)  BMI 21.86 kg/m2   GEN: Appears thin and frail, well developed, in no acute distress HEENT: normal Neck: no JVD, carotid bruits, or masses Cardiac: RRR; no murmurs, rubs, or gallops,no edema  Respiratory:  clear to auscultation bilaterally, normal work of breathing GI: soft, nontender, nondistended, + BS MS: no deformity or atrophy Skin: warm and dry, no rash Neuro:  Alert and Oriented x 3, Strength and sensation are intact Psych: euthymic mood, full affect  Wt Readings from Last 3 Encounters:  03/23/16 61.417 kg (135 lb 6.4 oz)  03/22/16 62.143 kg (137 lb)  03/11/16 60.873 kg (134 lb 3.2 oz)      Studies/Labs Reviewed:   EKG:  EKG is not ordered today.  The ekg ordered April 19 demonstrates sinus tachycardia without repolarization abnormalities  Recent Labs: 02/26/2016: Magnesium 1.7 02/28/2016: TSH 8.675* 03/07/2016: ALT 13; BUN 13.7; Creatinine 0.8; HGB 14.4; Platelets 282; Potassium 3.9; Sodium 138   Additional studies/  records that were reviewed today include:  Hospital records from April    ASSESSMENT:    1. Syncope Secondary to hypotension      PLAN:  In order of problems listed above:  Mrs. Connole Had a presentation highly compatible with syncope related to hypotension, in turn secondary to hypovolemia in the setting of DKA and gastrointestinal volume loss. Her event monitor, although very abbreviated, did not show any significant arrhythmia. Her physical exam does not suggest the presence of any significant cardiac illness. She has limited coronary risk factors and no complaints of angina. There have been no ECG changes suggestive of ischemic abnormality. We discussed the fact that hyperglycemia over a level of roughly 180 mg/deciliter will lead to obligatory diuresis and dehydration. She has a penchant for drinking iced tea and caffeinated beverages. She wrinkles her nose at the thought of drinking water. I told her that the ingredients in tea, coffee and caffeinated sodas are all diuretics and might actually worsen the fluid loss. She will do her best to drink more water.  She does not have any evidence of structural heart disease. The suspicion for coronary artery disease is low, especially since her diabetes is of relatively recent onset and she quit smoking 20 years ago. It is likely that her prognosis is primarily determined by her lung neoplasm and additional cardiac workup at this point does not appear justified.    Medication Adjustments/Labs and Tests Ordered: Current medicines are reviewed at length with the patient today.  Concerns regarding medicines are outlined above.  Medication changes, Labs and Tests ordered today are listed in the Patient Instructions below. Patient Instructions  Your physician recommends that you continue on your current medications as directed. Please refer to the Current Medication list given to you today.  Dr Sallyanne Kuster recommends that you schedule a follow-up  appointment in 6 months. You will receive a reminder letter in the mail two months in advance. If you don't receive a letter, please call our office to schedule the follow-up appointment.  If you need a refill on your cardiac medications before your next appointment, please call your pharmacy.    Signed, Sanda Klein, MD  03/25/2016 1:07 PM  [facility] [address], [place], [place]  [address] Phone: [phone]; Fax: [phone]

## 2016-03-29 ENCOUNTER — Telehealth: Payer: Self-pay | Admitting: Internal Medicine

## 2016-03-29 NOTE — Telephone Encounter (Signed)
Fax was received with same request. Form completed and faxed back.

## 2016-03-29 NOTE — Telephone Encounter (Signed)
pt called to resched 5/31 apt

## 2016-03-30 ENCOUNTER — Ambulatory Visit: Payer: Medicare Other | Admitting: Internal Medicine

## 2016-03-30 NOTE — Telephone Encounter (Signed)
[facility] fax received. Uceris 9 mg is approved with insurance through 10-30-2016. Pt notified and aware. She states the imodium is working well. I did advise her to start the Uceris.

## 2016-03-31 ENCOUNTER — Encounter: Payer: Self-pay | Admitting: Internal Medicine

## 2016-03-31 ENCOUNTER — Telehealth: Payer: Self-pay | Admitting: Internal Medicine

## 2016-03-31 ENCOUNTER — Ambulatory Visit (HOSPITAL_BASED_OUTPATIENT_CLINIC_OR_DEPARTMENT_OTHER): Payer: Medicare Other | Admitting: Internal Medicine

## 2016-03-31 VITALS — BP 99/54 | HR 97 | Temp 98.4°F | Resp 17 | Ht 66.0 in | Wt 133.0 lb

## 2016-03-31 DIAGNOSIS — G620 Drug-induced polyneuropathy: Secondary | ICD-10-CM

## 2016-03-31 DIAGNOSIS — T451X5A Adverse effect of antineoplastic and immunosuppressive drugs, initial encounter: Secondary | ICD-10-CM

## 2016-03-31 DIAGNOSIS — Z9221 Personal history of antineoplastic chemotherapy: Secondary | ICD-10-CM | POA: Diagnosis not present

## 2016-03-31 DIAGNOSIS — Z923 Personal history of irradiation: Secondary | ICD-10-CM | POA: Diagnosis not present

## 2016-03-31 DIAGNOSIS — C3411 Malignant neoplasm of upper lobe, right bronchus or lung: Secondary | ICD-10-CM

## 2016-03-31 NOTE — Progress Notes (Signed)
[place] Telephone:([phone]) [phone]   Fax:([phone]) [phone]  OFFICE PROGRESS NOTE  No PCP Per Patient No address on file  DIAGNOSIS: Squamous cell lung cancer  Primary site: Lung (Right)  Staging method: AJCC 7th Edition  Clinical: Stage IIIB (T3, N3, M0)  Summary: Stage IIIB (T3, N3, M0)   PRIOR THERAPY:  1) Concurrent chemoradiation with weekly chemotherapy in the form of carboplatin for AUC of 2 and paclitaxel 45 mg/m2. Status post 7 week of treatment. Last dose was given 01/27/2014 no significant response in her disease.  2) Consolidation chemotherapy with carboplatin for AUC of 5 and paclitaxel 175 mg/M2 every 3 weeks with Neulasta support. First dose on 04/15/2014. Status post 3 cycles. Carboplatin was discontinued starting cycle #2 secondary to hypersensitivity reaction. 3) Immunotherapy with Opdivo (Nivolumab) '3mg'$ /kg given every 2 weeks. Status post 33 cycles discontinued secondary to intolerance.  CURRENT THERAPY: Observation.  CHEMOTHERAPY INTENT: Control/Palliative  CURRENT # OF CHEMOTHERAPY CYCLES: 33 CURRENT ANTIEMETICS: Zofran, dexamethasone, Compazine  CURRENT SMOKING STATUS: Former smoker, quit 12/05/1997  ORAL CHEMOTHERAPY AND CONSENT: n/a  CURRENT BISPHOSPHONATES USE: None  PAIN MANAGEMENT: no cancer related pain, patient does have RA  NARCOTICS INDUCED CONSTIPATION: none  LIVING WILL AND CODE STATUS: ?  INTERVAL HISTORY: Megan [name] 75 y.o. female returns to the clinic today for follow-up visit. The patient is feeling fine today with no specific complaints. She has another fall recently and had ecchymosis around the right eye. She was seen by her cardiologist. He did not feel that her falls are secondary to orthostatic hypotension. She continues to have significant fatigue and weakness. The patient denied having any chest pain, shortness of breath, cough or hemoptysis. She has no significant nausea or vomiting, no fever or  chills. She had repeat CT scan of the chest performed recently and she is here for evaluation and discussion of her scan results.  MEDICAL HISTORY: Past Medical History  Diagnosis Date  . Hypertension   . Rheumatoid arthritis ([place])   . GERD (gastroesophageal reflux disease)     no meds for  . Hx of radiation therapy 12/16/13-01/30/14    lung 66Gy  . Neuropathy ([place])   . Shortness of breath dyspnea     with exertion  . History of hiatal hernia   . Diabetes mellitus     insulin  . Psoriasis   . Cancer ([place]) dx'd 09/2013    Lung ca  . Malignant neoplasm of right upper lobe of lung ([place]) 11/28/2013    ALLERGIES:  is allergic to carboplatin; remicade; and hydromorphone.  MEDICATIONS:  Current Outpatient Prescriptions  Medication Sig Dispense Refill  . Budesonide (UCERIS) 9 MG TB24 Take 9 mg by mouth daily. 30 tablet 3  . fluticasone (CUTIVATE) 0.05 % cream Apply cream to rash/psoriasis on sacrum topically twice daily. Apply 1st before Nizoral cream.    . insulin detemir (LEVEMIR) 100 UNIT/ML injection Inject 15 units SQ at bedtime.    . insulin lispro (HUMALOG) 100 UNIT/ML injection Check CBG inject SSI Sub Q 3 times daily with meals. DM 0-69 = hypoglycemic protocol, 70 -120 = 0 units 121- 150 = 2 units, 151 - 200 = 3 units, 201 - 250 = 5 units, 251 - 300 = 8 units, 301 - 350 = 11 units, 351 - 400 = 15 units > 400 = 15 units Call MD.    . ketoconazole (NIZORAL) 2 % cream Apply 1 application topically daily. Apply cream to rash/psoriasis  to sacrum topically twice a daily. Apply after Cutivate cream.    . Vitamin D, Ergocalciferol, (DRISDOL) 50000 units CAPS capsule Take 1 capsule (50,000 Units total) by mouth every 7 (seven) days. 30 capsule    No current facility-administered medications for this visit.   Facility-Administered Medications Ordered in Other Visits  Medication Dose Route Frequency Provider Last Rate Last Dose  . sodium chloride 0.9 % injection 10 mL  10 mL Intracatheter  PRN Curt Bears, MD   10 mL at 12/30/13 1121    SURGICAL HISTORY:  Past Surgical History  Procedure Laterality Date  . Cholecystectomy    . Abdominal hysterectomy    . Dilation and curettage of uterus    . Abdominal surgery    . Tonsillectomy    . Esophagogastroduodenoscopy (egd) with propofol N/A 10/09/2014    Procedure: ESOPHAGOGASTRODUODENOSCOPY (EGD) WITH PROPOFOL;  Surgeon: Inda Castle, MD;  Location: [place];  Service: Endoscopy;  Laterality: N/A;  . Savory dilation N/A 10/09/2014    Procedure: SAVORY DILATION;  Surgeon: Inda Castle, MD;  Location: [place];  Service: Endoscopy;  Laterality: N/A;  . Esophagogastroduodenoscopy N/A 12/12/2014    Procedure: ESOPHAGOGASTRODUODENOSCOPY (EGD);  Surgeon: Inda Castle, MD;  Location: [facility];  Service: Endoscopy;  Laterality: N/A;  with dilation  . Balloon dilation N/A 12/12/2014    Procedure: BALLOON DILATION;  Surgeon: Inda Castle, MD;  Location: [facility];  Service: Endoscopy;  Laterality: N/A;  . Orif ankle fracture Right 07/04/2015    Procedure: OPEN REDUCTION INTERNAL FIXATION (ORIF) ANKLE FRACTURE;  Surgeon: Newt Minion, MD;  Location: [place];  Service: Orthopedics;  Laterality: Right;  OPEN REDUCTION, INTERNAL FIXATION OF RIGHT ANKLE FRACTURE.   . Fracture surgery    . Colonoscopy N/A 02/07/2016    Procedure: COLONOSCOPY;  Surgeon: Doran Stabler, MD;  Location[facility] END[facility]COPY;  Service: Endoscopy;  Laterality: N/A;  . Orif ankle fracture Left 02/10/2016    Procedure: OPEN REDUCTION INTERNAL FIXATION (ORIF) ANKLE FRACTURE;  Surgeon: Newt Minion, MD;  Location: [place];  Service: Orthopedics;  Laterality: Left;    REVIEW OF SYSTEMS:  Constitutional: positive for fatigue and weight loss Eyes: negative Ears, nose, mouth, throat, and face: negative Respiratory: negative Cardiovascular: negative Gastrointestinal: negative Genitourinary:negative Integument/breast: positive for  rash Hematologic/lymphatic: negative Musculoskeletal:positive for muscle weakness Neurological: negative Behavioral/Psych: negative Endocrine: negative Allergic/Immunologic: negative   PHYSICAL EXAMINATION: General appearance: alert, cooperative, fatigued and no distress Head: Normocephalic, without obvious abnormality, atraumatic Neck: no adenopathy, no JVD, supple, symmetrical, trachea midline and thyroid not enlarged, symmetric, no tenderness/mass/nodules Lymph nodes: Cervical, supraclavicular, and axillary nodes normal. Resp: clear to auscultation bilaterally Back: symmetric, no curvature. ROM normal. No CVA tenderness. Cardio: regular rate and rhythm, S1, S2 normal, no murmur, click, rub or gallop GI: soft, non-tender; bowel sounds normal; no masses,  no organomegaly Extremities: extremities normal, atraumatic, no cyanosis or edema Neurologic: Alert and oriented X 3, normal strength and tone. Normal symmetric reflexes. Normal coordination and gait  Skin exam: few macular areas of rash on the upper and lower extremities.     ECOG PERFORMANCE STATUS: 1 - Symptomatic but completely ambulatory  There were no vitals taken for this visit.  LABORATORY DATA: Lab Results  Component Value Date   WBC 16.8* 03/07/2016   HGB 14.4 03/07/2016   HCT 44.2 03/07/2016   MCV 91.1 03/07/2016   PLT 282 03/07/2016      Chemistry      Component Value  Date/Time   NA 138 03/07/2016 1003   NA 141 03/07/2016   NA 136 03/01/2016 0659   K 3.9 03/07/2016 1003   K 4.0 03/07/2016   CL 105 03/01/2016 0659   CO2 26 03/07/2016 1003   CO2 22 03/01/2016 0659   BUN 13.7 03/07/2016 1003   BUN 14 03/07/2016   BUN 12 03/01/2016 0659   CREATININE 0.8 03/07/2016 1003   CREATININE 0.7 03/07/2016   CREATININE 0.61 03/01/2016 0659   GLU 176 03/07/2016      Component Value Date/Time   CALCIUM 8.3* 03/07/2016 1003   CALCIUM 7.6* 03/01/2016 0659   ALKPHOS 117 03/07/2016 1003   ALKPHOS 117 03/07/2016    AST 8 03/07/2016 1003   AST 7* 03/07/2016   ALT 13 03/07/2016 1003   ALT 8 03/07/2016   BILITOT 0.63 03/07/2016 1003   BILITOT 0.5 02/29/2016 0420       RADIOGRAPHIC STUDIES: Ct Chest W Contrast  03/14/2016  CLINICAL DATA:  Right-sided lung cancer. Radiation therapy complete 4/15. Immune therapy stopped. In January of 2017. EXAM: CT CHEST WITH CONTRAST TECHNIQUE: Multidetector CT imaging of the chest was performed during intravenous contrast administration. CONTRAST:  37m ISOVUE-300 IOPAMIDOL (ISOVUE-300) INJECTION 61% COMPARISON:  Chest radiographs including 02/26/2016. Most recent chest CT 11/30/2015. FINDINGS: Mediastinum/Nodes: No supraclavicular adenopathy. Tortuous thoracic aorta. Normal heart size, without pericardial effusion. Lad coronary artery atherosclerosis. No central pulmonary embolism, on this non-dedicated study. No mediastinal or hilar adenopathy. Lungs/Pleura: Small right pleural effusion is new. Endobronchial caught off involving the right upper and right middle lobes, similar. Mosaic attenuation throughout both lungs is new. Left lower lobe ground-glass nodularity is less conspicuous today. Similar right upper and middle lobe collapse. Posterior right upper lobe marked architectural distortion is similar, and favored to be treatment related. Upper abdomen: Cholecystectomy. Left hepatic lobe cysts. Old granulomatous disease in the spleen. Normal imaged portions of the pancreas, stomach, adrenal glands, kidneys. Abdominal aortic atherosclerosis. Musculoskeletal: Convex right thoracic spine curvature. IMPRESSION: 1. Similar appearance of the medial right lung. Right upper and right middle lobe collapse with presumed treatment related architectural distortion in the adjacent right lower lobe. 2. New small right pleural effusion. 3. Relatively diffuse mosaic attenuation throughout both lungs, new. Considerations include atypical infection or drug toxicity. 4.  Atherosclerosis,  including within the coronary arteries. Electronically Signed   By: KAbigail MiyamotoM.D.   On: 03/14/2016 16:18    ASSESSMENT AND PLAN: This is a very pleasant 75years old white female with metastatic non-small cell lung cancer, squamous cell carcinoma who is currently undergoing treatment with immunotherapy with Nivolumab status post 33 cycles and tolerating her treatment fairly well except for recently several episodes of diarrhea concerning to be immune mediated from her treatment. This completely resolved after the patient was treated with prednisone that has been tapered. She has been observation for the last few months.  The recent CT scan of the chest showed no evidence for disease progression. I discussed the scan results with the patient today. I recommended for her to continue on observation with repeat CT scan of the chest in 3 months for restaging of her disease. The patient was advised to call immediately if she has any concerning symptoms in the interval. The patient voices understanding of current disease status and treatment options and is in agreement with the current care plan.  All questions were answered. The patient knows to call the clinic with any problems, questions or concerns. We can certainly see  the patient much sooner if necessary.  Disclaimer: This note was dictated with voice recognition software. Similar sounding words can inadvertently be transcribed and may not be corrected upon review.

## 2016-03-31 NOTE — Telephone Encounter (Signed)
Gave pt apt & avs °

## 2016-04-07 ENCOUNTER — Telehealth: Payer: Self-pay | Admitting: Internal Medicine

## 2016-04-07 NOTE — Telephone Encounter (Signed)
Rec'd from [facility] forward 60 pages Dr. Cruzita Lederer

## 2016-04-14 ENCOUNTER — Inpatient Hospital Stay (HOSPITAL_COMMUNITY)
Admission: EM | Admit: 2016-04-14 | Discharge: 2016-04-16 | DRG: 638 | Disposition: A | Discharge status: Home Health | Payer: Medicare Other | Attending: Family Medicine | Admitting: Family Medicine

## 2016-04-14 ENCOUNTER — Encounter (HOSPITAL_COMMUNITY): Payer: Self-pay | Admitting: *Deleted

## 2016-04-14 DIAGNOSIS — E869 Volume depletion, unspecified: Secondary | ICD-10-CM | POA: Diagnosis present

## 2016-04-14 DIAGNOSIS — Z794 Long term (current) use of insulin: Secondary | ICD-10-CM

## 2016-04-14 DIAGNOSIS — E1310 Other specified diabetes mellitus with ketoacidosis without coma: Principal | ICD-10-CM | POA: Diagnosis present

## 2016-04-14 DIAGNOSIS — E1140 Type 2 diabetes mellitus with diabetic neuropathy, unspecified: Secondary | ICD-10-CM | POA: Diagnosis present

## 2016-04-14 DIAGNOSIS — M069 Rheumatoid arthritis, unspecified: Secondary | ICD-10-CM | POA: Diagnosis present

## 2016-04-14 DIAGNOSIS — Z8249 Family history of ischemic heart disease and other diseases of the circulatory system: Secondary | ICD-10-CM

## 2016-04-14 DIAGNOSIS — Z923 Personal history of irradiation: Secondary | ICD-10-CM

## 2016-04-14 DIAGNOSIS — I10 Essential (primary) hypertension: Secondary | ICD-10-CM | POA: Diagnosis present

## 2016-04-14 DIAGNOSIS — E1110 Type 2 diabetes mellitus with ketoacidosis without coma: Secondary | ICD-10-CM

## 2016-04-14 DIAGNOSIS — Z87891 Personal history of nicotine dependence: Secondary | ICD-10-CM

## 2016-04-14 DIAGNOSIS — Z9221 Personal history of antineoplastic chemotherapy: Secondary | ICD-10-CM | POA: Diagnosis not present

## 2016-04-14 DIAGNOSIS — Z823 Family history of stroke: Secondary | ICD-10-CM

## 2016-04-14 DIAGNOSIS — K219 Gastro-esophageal reflux disease without esophagitis: Secondary | ICD-10-CM | POA: Diagnosis present

## 2016-04-14 DIAGNOSIS — C3411 Malignant neoplasm of upper lobe, right bronchus or lung: Secondary | ICD-10-CM | POA: Diagnosis present

## 2016-04-14 LAB — CBC
HCT: 35.3 % — ABNORMAL LOW (ref 36.0–46.0)
HEMATOCRIT: 41.8 % (ref 36.0–46.0)
Hemoglobin: 13.5 g/dL (ref 12.0–15.0)
Hemoglobin: 11.9 g/dL — ABNORMAL LOW (ref 12.0–15.0)
MCH: 28.3 pg (ref 26.0–34.0)
MCH: 28.8 pg (ref 26.0–34.0)
MCHC: 32.3 g/dL (ref 30.0–36.0)
MCHC: 33.7 g/dL (ref 30.0–36.0)
MCV: 87.6 fL (ref 78.0–100.0)
MCV: 85.5 fL (ref 78.0–100.0)
Platelets: 221 10*3/uL (ref 150–400)
Platelets: 173 10*3/uL (ref 150–400)
RBC: 4.77 MIL/uL (ref 3.87–5.11)
RBC: 4.13 MIL/uL (ref 3.87–5.11)
RDW: 15.4 % (ref 11.5–15.5)
RDW: 15.4 % (ref 11.5–15.5)
WBC: 11.4 10*3/uL — ABNORMAL HIGH (ref 4.0–10.5)
WBC: 7.9 10*3/uL (ref 4.0–10.5)

## 2016-04-14 LAB — BASIC METABOLIC PANEL
Anion gap: 20 — ABNORMAL HIGH (ref 5–15)
Anion gap: 7 (ref 5–15)
BUN: 25 mg/dL — AB (ref 6–20)
BUN: 22 mg/dL — ABNORMAL HIGH (ref 6–20)
CHLORIDE: 96 mmol/L — AB (ref 101–111)
CO2: 15 mmol/L — ABNORMAL LOW (ref 22–32)
CO2: 25 mmol/L (ref 22–32)
Calcium: 9.2 mg/dL (ref 8.9–10.3)
Calcium: 8.5 mg/dL — ABNORMAL LOW (ref 8.9–10.3)
Chloride: 101 mmol/L (ref 101–111)
Creatinine, Ser: 1.2 mg/dL — ABNORMAL HIGH (ref 0.44–1.00)
Creatinine, Ser: 0.83 mg/dL (ref 0.44–1.00)
GFR calc Af Amer: 50 mL/min — ABNORMAL LOW (ref >60)
GFR calc Af Amer: >60 mL/min (ref >60)
GFR calc non Af Amer: 43 mL/min — ABNORMAL LOW (ref >60)
GFR calc non Af Amer: >60 mL/min (ref >60)
GLUCOSE: 562 mg/dL — AB (ref 65–99)
Glucose, Bld: 252 mg/dL — ABNORMAL HIGH (ref 65–99)
POTASSIUM: 3.8 mmol/L (ref 3.5–5.1)
Potassium: 3.4 mmol/L — ABNORMAL LOW (ref 3.5–5.1)
Sodium: 131 mmol/L — ABNORMAL LOW (ref 135–145)
Sodium: 133 mmol/L — ABNORMAL LOW (ref 135–145)

## 2016-04-14 LAB — CBG MONITORING, ED
GLUCOSE-CAPILLARY: 386 mg/dL — AB (ref 65–99)
Glucose-Capillary: >600 mg/dL (ref 65–99)
Glucose-Capillary: 571 mg/dL (ref 65–99)
Glucose-Capillary: 340 mg/dL — ABNORMAL HIGH (ref 65–99)

## 2016-04-14 LAB — GLUCOSE, CAPILLARY
Glucose-Capillary: 287 mg/dL — ABNORMAL HIGH (ref 65–99)
Glucose-Capillary: 254 mg/dL — ABNORMAL HIGH (ref 65–99)
Glucose-Capillary: 148 mg/dL — ABNORMAL HIGH (ref 65–99)
Glucose-Capillary: 117 mg/dL — ABNORMAL HIGH (ref 65–99)
Glucose-Capillary: 90 mg/dL (ref 65–99)
Glucose-Capillary: 89 mg/dL (ref 65–99)
Glucose-Capillary: 116 mg/dL — ABNORMAL HIGH (ref 65–99)
Glucose-Capillary: 139 mg/dL — ABNORMAL HIGH (ref 65–99)

## 2016-04-14 LAB — MAGNESIUM: Magnesium: 1.7 mg/dL (ref 1.7–2.4)

## 2016-04-14 LAB — PHOSPHORUS: Phosphorus: 3.2 mg/dL (ref 2.5–4.6)

## 2016-04-14 LAB — MRSA PCR SCREENING: MRSA by PCR: NEGATIVE

## 2016-04-14 LAB — TROPONIN I: Troponin I: <0.03 ng/mL (ref <0.031)

## 2016-04-14 MED ORDER — DEXTROSE-NACL 5-0.45 % IV SOLN: INTRAVENOUS | Status: DC

## 2016-04-14 MED ORDER — BUDESONIDE 3 MG PO CPEP
9.0000 mg | ORAL_CAPSULE | Freq: Every day | ORAL | Status: DC
  Administered 2016-04-15 – 2016-04-16 (×2): 9 mg via ORAL
  Filled 2016-04-14 (×2): qty 3

## 2016-04-14 MED ORDER — FLUTICASONE PROPIONATE 0.05 % EX CREA: TOPICAL_CREAM | Freq: Two times a day (BID) | CUTANEOUS | Status: DC

## 2016-04-14 MED ORDER — POTASSIUM CHLORIDE 10 MEQ/100ML IV SOLN
10.0000 meq | INTRAVENOUS | Status: AC
  Administered 2016-04-14 (×4): 10 meq via INTRAVENOUS
  Filled 2016-04-14 (×4): qty 100

## 2016-04-14 MED ORDER — GABAPENTIN 600 MG PO TABS
600.0000 mg | ORAL_TABLET | Freq: Two times a day (BID) | ORAL | Status: DC
  Filled 2016-04-14: qty 1

## 2016-04-14 MED ORDER — SODIUM CHLORIDE 0.9 % IV SOLN
INTRAVENOUS | Status: AC
  Administered 2016-04-14: 16:00:00 via INTRAVENOUS

## 2016-04-14 MED ORDER — DEXTROSE-NACL 5-0.45 % IV SOLN
INTRAVENOUS | Status: DC
  Administered 2016-04-14: 18:00:00 via INTRAVENOUS

## 2016-04-14 MED ORDER — INSULIN ASPART 100 UNIT/ML ~~LOC~~ SOLN: 0.0000 [IU] | Freq: Every day | SUBCUTANEOUS | Status: DC

## 2016-04-14 MED ORDER — KETOCONAZOLE 2 % EX CREA
1.0000 "application " | TOPICAL_CREAM | Freq: Every day | CUTANEOUS | Status: DC
  Administered 2016-04-14: 1 via TOPICAL
  Filled 2016-04-14: qty 15

## 2016-04-14 MED ORDER — ENOXAPARIN SODIUM 40 MG/0.4ML ~~LOC~~ SOLN
40.0000 mg | SUBCUTANEOUS | Status: DC
  Administered 2016-04-14 – 2016-04-15 (×2): 40 mg via SUBCUTANEOUS
  Filled 2016-04-14 (×3): qty 0.4

## 2016-04-14 MED ORDER — SODIUM CHLORIDE 0.9 % IV SOLN
INTRAVENOUS | Status: DC
  Administered 2016-04-14: 6.8 [IU]/h via INTRAVENOUS
  Administered 2016-04-14: 5.1 [IU]/h via INTRAVENOUS
  Filled 2016-04-14: qty 2.5

## 2016-04-14 MED ORDER — ENOXAPARIN SODIUM 40 MG/0.4ML ~~LOC~~ SOLN: 40.0000 mg | SUBCUTANEOUS | Status: DC

## 2016-04-14 MED ORDER — BUDESONIDE 9 MG PO TB24: 9.0000 mg | ORAL_TABLET | Freq: Every day | ORAL | Status: DC

## 2016-04-14 MED ORDER — SODIUM CHLORIDE 0.9 % IV SOLN
INTRAVENOUS | Status: DC
  Administered 2016-04-14 – 2016-04-15 (×2): via INTRAVENOUS

## 2016-04-14 MED ORDER — SODIUM CHLORIDE 0.9 % IV BOLUS (SEPSIS)
1000.0000 mL | Freq: Once | INTRAVENOUS | Status: AC
  Administered 2016-04-14: 1000 mL via INTRAVENOUS

## 2016-04-14 MED ORDER — GABAPENTIN 300 MG PO CAPS
600.0000 mg | ORAL_CAPSULE | Freq: Two times a day (BID) | ORAL | Status: DC
  Administered 2016-04-14 – 2016-04-16 (×4): 600 mg via ORAL
  Filled 2016-04-14 (×5): qty 2

## 2016-04-14 MED ORDER — DARIFENACIN HYDROBROMIDE ER 7.5 MG PO TB24
7.5000 mg | ORAL_TABLET | Freq: Every day | ORAL | Status: DC
  Administered 2016-04-14 – 2016-04-16 (×3): 7.5 mg via ORAL
  Filled 2016-04-14 (×3): qty 1

## 2016-04-14 MED ORDER — INSULIN ASPART 100 UNIT/ML ~~LOC~~ SOLN
0.0000 [IU] | Freq: Three times a day (TID) | SUBCUTANEOUS | Status: DC
  Administered 2016-04-15: 11 [IU] via SUBCUTANEOUS
  Administered 2016-04-15 – 2016-04-16 (×2): 3 [IU] via SUBCUTANEOUS

## 2016-04-14 MED ORDER — DIPHENOXYLATE-ATROPINE 2.5-0.025 MG PO TABS
2.0000 | ORAL_TABLET | Freq: Once | ORAL | Status: AC
  Administered 2016-04-14: 2 via ORAL
  Filled 2016-04-14: qty 2

## 2016-04-14 MED ORDER — INSULIN DETEMIR 100 UNIT/ML ~~LOC~~ SOLN
20.0000 [IU] | Freq: Every day | SUBCUTANEOUS | Status: DC
  Administered 2016-04-14 – 2016-04-15 (×2): 20 [IU] via SUBCUTANEOUS
  Filled 2016-04-14 (×2): qty 0.2

## 2016-04-14 MED ORDER — SODIUM CHLORIDE 0.9 % IV SOLN
INTRAVENOUS | Status: DC
  Administered 2016-04-14: 6.8 [IU]/h via INTRAVENOUS
  Filled 2016-04-14: qty 2.5

## 2016-04-14 NOTE — Progress Notes (Signed)
ED CM notified Tim of [facility] that pt is being admitted so pt can be followed for d/c needs

## 2016-04-14 NOTE — H&P (Signed)
History and Physical  Megan [name] WJX:[id number] DOB: [birth year] DOA: 04/14/2016  Referring physician: ER Physician PCP: No PCP Per Patient  Outpatient Specialists:    Patient coming from: Home  Chief Complaint: Elevated blood sugar, polyuria and polydipsia  HPI: 75 year old female with history of DM with Neuropathy, cancer of right lung, psoriasis, RA and Hypertension. According to the patient, her blood sugar had been well controlled until she ate [place] last night. Subsequently, patient has found it difficult controlling his blood sugar, with associated polyuria and polydipsia. On presentation to the hospital, patient was found to be in DKA. No headache, no neck pain, no chest pain, no GI symptoms. No fever or chills.  ED Course: Started on insulin drip.  Pertinent labs: Anion gap of 20, blood sugar of 562, sodium of 131, Scr of 1.2 (up from 0.8).   Review of Systems: As in HPI. Negative for fever, visual changes, sore throat, rash, new muscle aches, chest pain, SOB, dysuria, bleeding, n/v/abdominal pain.  Past Medical History  Diagnosis Date  . Hypertension   . Rheumatoid arthritis ([place])   . GERD (gastroesophageal reflux disease)     no meds for  . Hx of radiation therapy 12/16/13-01/30/14    lung 66Gy  . Neuropathy ([place])   . Shortness of breath dyspnea     with exertion  . History of hiatal hernia   . Diabetes mellitus     insulin  . Psoriasis   . Cancer ([place]) dx'd 09/2013    Lung ca  . Malignant neoplasm of right upper lobe of lung ([place]) 11/28/2013    Past Surgical History  Procedure Laterality Date  . Cholecystectomy    . Abdominal hysterectomy    . Dilation and curettage of uterus    . Abdominal surgery    . Tonsillectomy    . Esophagogastroduodenoscopy (egd) with propofol N/A 10/09/2014    Procedure: ESOPHAGOGASTRODUODENOSCOPY (EGD) WITH PROPOFOL;  Surgeon: Inda Castle, MD;  Location: [place];  Service: Endoscopy;  Laterality: N/A;  . Savory  dilation N/A 10/09/2014    Procedure: SAVORY DILATION;  Surgeon: Inda Castle, MD;  Location: [place];  Service: Endoscopy;  Laterality: N/A;  . Esophagogastroduodenoscopy N/A 12/12/2014    Procedure: ESOPHAGOGASTRODUODENOSCOPY (EGD);  Surgeon: Inda Castle, MD;  Location: [facility];  Service: Endoscopy;  Laterality: N/A;  with dilation  . Balloon dilation N/A 12/12/2014    Procedure: BALLOON DILATION;  Surgeon: Inda Castle, MD;  Location: [facility];  Service: Endoscopy;  Laterality: N/A;  . Orif ankle fracture Right 07/04/2015    Procedure: OPEN REDUCTION INTERNAL FIXATION (ORIF) ANKLE FRACTURE;  Surgeon: Newt Minion, MD;  Location: [place];  Service: Orthopedics;  Laterality: Right;  OPEN REDUCTION, INTERNAL FIXATION OF RIGHT ANKLE FRACTURE.   . Fracture surgery    . Colonoscopy N/A 02/07/2016    Procedure: COLONOSCOPY;  Surgeon: Doran Stabler, MD;  Location: [facility] ENDOSCOPY;  Service: Endoscopy;  Laterality: N/A;  . Orif ankle fracture Left 02/10/2016    Procedure: OPEN REDUCTION INTERNAL FIXATION (ORIF) ANKLE FRACTURE;  Surgeon: Newt Minion, MD;  Location: [place];  Service: Orthopedics;  Laterality: Left;     reports that she quit smoking about 18 years ago. Her smoking use included Cigarettes. She has a 135 pack-year smoking history. She has never used smokeless tobacco. She reports that she does not drink alcohol or use illicit drugs.  Allergies  Allergen Reactions  . Carboplatin Other (See  Comments)    Neuropathy  . Remicade [Infliximab] Anaphylaxis  . Hydromorphone Rash    Family History  Problem Relation Age of Onset  . Hypertension Other   . Stroke Other   . Heart attack Other   . Hemachromatosis Other   . Rheum arthritis      both sets of grandparents and father  . Other Mother   . Other Father     failure to thrive     Prior to Admission medications   Medication Sig Start Date End Date Taking? Authorizing Provider  Budesonide (UCERIS) 9 MG TB24  Take 9 mg by mouth daily. 03/22/16  Yes Nelida Meuse III, MD  fluticasone (CUTIVATE) 0.05 % cream Apply cream to rash/psoriasis on sacrum topically twice daily. Apply 1st before Nizoral cream.   Yes Historical Provider, MD  gabapentin (NEURONTIN) 600 MG tablet Take 600 mg by mouth 2 (two) times daily.   Yes Historical Provider, MD  insulin detemir (LEVEMIR) 100 UNIT/ML injection Inject 20 Units into the skin at bedtime. Inject 15 units SQ at bedtime.   Yes Historical Provider, MD  insulin lispro (HUMALOG) 100 UNIT/ML injection Inject 6 Units into the skin 3 (three) times daily with meals. Check CBG inject SSI Sub Q 3 times daily with meals. DM 0-69 = hypoglycemic protocol, 70 -120 = 0 units 121- 150 = 2 units, 151 - 200 = 3 units, 201 - 250 = 5 units, 251 - 300 = 8 units, 301 - 350 = 11 units, 351 - 400 = 15 units > 400 = 15 units Call MD.   Yes Historical Provider, MD  ketoconazole (NIZORAL) 2 % cream Apply 1 application topically daily. Apply cream to rash/psoriasis to sacrum topically twice a daily. Apply after Cutivate cream.   Yes Historical Provider, MD  solifenacin (VESICARE) 5 MG tablet Take 5 mg by mouth daily.   Yes Historical Provider, MD  Vitamin D, Ergocalciferol, (DRISDOL) 50000 units CAPS capsule Take 1 capsule (50,000 Units total) by mouth every 7 (seven) days. 02/12/16   Ripudeep Krystal Eaton, MD    Physical Exam: Filed Vitals:   04/14/16 1145  BP: 99/69  Pulse: 103  Temp: 97.9 F (36.6 C)  TempSrc: Oral  Resp: 15  SpO2: 97%    Constitutional:  . Appears calm and comfortable Eyes:  . No pallor. No jaundice.  ENMT:  . external ears, nose appear normal Neck:  . Neck is supple. No JVD Respiratory:  . CTA bilaterally, no w/r/r.  . Respiratory effort normal. No retractions or accessory muscle use Cardiovascular:  . S1S2 . No LE extremity edema   Abdomen:  . Abdomen is soft and non tender. Organs are difficult to assess. Neurologic:  . Awake and alert. . Moves all  limbs.  Wt Readings from Last 3 Encounters:  03/31/16 60.328 kg (133 lb)  03/23/16 61.417 kg (135 lb 6.4 oz)  03/22/16 62.143 kg (137 lb)    I have personally reviewed following labs.  Labs on Admission:  CBC:  Recent Labs Lab 04/14/16 1148  WBC 11.4*  HGB 13.5  HCT 41.8  MCV 87.6  PLT 106   Basic Metabolic Panel:  Recent Labs Lab 04/14/16 1148  NA 131*  K 3.8  CL 96*  CO2 15*  GLUCOSE 562*  BUN 25*  CREATININE 1.20*  CALCIUM 9.2   Liver Function Tests: No results for input(s): AST, ALT, ALKPHOS, BILITOT, PROT, ALBUMIN in the last 168 hours. No results for input(s): LIPASE, AMYLASE  in the last 168 hours. No results for input(s): AMMONIA in the last 168 hours. Coagulation Profile: No results for input(s): INR, PROTIME in the last 168 hours. Cardiac Enzymes:  Recent Labs Lab 04/14/16 1148  TROPONINI <0.03   BNP (last 3 results) No results for input(s): PROBNP in the last 8760 hours. HbA1C: No results for input(s): HGBA1C in the last 72 hours. CBG:  Recent Labs Lab 04/14/16 1140  GLUCAP >600*   Lipid Profile: No results for input(s): CHOL, HDL, LDLCALC, TRIG, CHOLHDL, LDLDIRECT in the last 72 hours. Thyroid Function Tests: No results for input(s): TSH, T4TOTAL, FREET4, T3FREE, THYROIDAB in the last 72 hours. Anemia Panel: No results for input(s): VITAMINB12, FOLATE, FERRITIN, TIBC, IRON, RETICCTPCT in the last 72 hours. Urine analysis:    Component Value Date/Time   COLORURINE YELLOW 03/02/2016 1212   APPEARANCEUR CLOUDY* 03/02/2016 1212   LABSPEC 1.020 03/02/2016 1212   LABSPEC 1.030 08/25/2015 1130   PHURINE 5.5 03/02/2016 1212   PHURINE 6.0 08/25/2015 1130   GLUCOSEU 100* 03/02/2016 1212   GLUCOSEU Negative 08/25/2015 1130   HGBUR NEGATIVE 03/02/2016 1212   HGBUR Negative 08/25/2015 1130   BILIRUBINUR NEGATIVE 03/02/2016 1212   BILIRUBINUR Negative 08/25/2015 1130   KETONESUR NEGATIVE 03/02/2016 1212   KETONESUR Negative 08/25/2015  1130   PROTEINUR NEGATIVE 03/02/2016 1212   PROTEINUR 30 08/25/2015 1130   UROBILINOGEN 0.2 08/25/2015 1130   UROBILINOGEN 0.2 07/01/2015 0345   NITRITE NEGATIVE 03/02/2016 1212   NITRITE Negative 08/25/2015 1130   LEUKOCYTESUR LARGE* 03/02/2016 1212   LEUKOCYTESUR Negative 08/25/2015 1130   Sepsis Labs: '@LABRCNTIP'$ (procalcitonin:4,lacticidven:4) )No results found for this or any previous visit (from the past 240 hour(s)).    Radiological Exams on Admission: No results found.  Active Problems:   DKA (diabetic ketoacidoses) (HCC)   Assessment/Plan 1. Diabetic ketoacidosis 2. Volume depletion   Admit patient to Step Down  Aggressive hydration  Start patient in DKA protocol  Diabetes education  Monitor and correct electrolytes  Further management will depend on hospital course  DVT prophylaxis: Lovenox Code Status: Full Family Communication:  Disposition Plan: Home   Consults called: None   Admission status: inpatient    Time spent: 60 minutes  Dana Allan, MD  [facility] Pager #: [phone] 7PM-7AM contact night coverage as above   04/14/2016, 2:28 PM

## 2016-04-14 NOTE — ED Notes (Signed)
Pt reports hyperglycemia and feeling thirsty and polyuria since early this am.  States she checked her BG at home and it read 'HI' on her meter.  Pt is A&Ox 4.  Pt reports diarrhea as well, has hx of colitis.

## 2016-04-14 NOTE — Progress Notes (Signed)
UR completed [facility]

## 2016-04-14 NOTE — ED Provider Notes (Signed)
CSN: [id number]     Arrival date & time 04/14/16  1115 History   First MD Initiated Contact with Patient 04/14/16 1121     Chief Complaint  Patient presents with  . Hyperglycemia      HPI  Patient presents for evaluation of high blood sugar with polyuria since this morning. Blood sugar at home was 600. She took 20 units of regular insulin this morning. Her normal doses 6 in the morning. She takes 20 of Levemir every afternoon. 6 units daily with meals. Has somewhat of a sliding scale as she will "sometimes give a little more" if she has high. She states it was "normal" yesterday.  She has been urinating frequently and thirsty. Drinking water upon arrival here.  Significant history of metastatic non-small cell lung cancer. Status post chemotherapy 2 courses and immunotherapy 33 courses. Her last treatment was for immunotherapy in January. Is was discontinued because she developed a colitis. Has been following with Dr. Julien Nordmann. Her most recent CT scan did not show any progression of disease.  She had previously been placed on Uceris/budesonide by mouth for her colitis. Has not taken that in over a month.  No other change in medications. She denies pain. She has occasional episodes of lightheadedness and orthostasis and falls. She felt lightheaded at home today and presents here.  Past Medical History  Diagnosis Date  . Hypertension   . Rheumatoid arthritis ([place])   . GERD (gastroesophageal reflux disease)     no meds for  . Hx of radiation therapy 12/16/13-01/30/14    lung 66Gy  . Neuropathy ([place])   . Shortness of breath dyspnea     with exertion  . History of hiatal hernia   . Diabetes mellitus     insulin  . Psoriasis   . Cancer ([place]) dx'd 09/2013    Lung ca  . Malignant neoplasm of right upper lobe of lung ([place]) 11/28/2013   Past Surgical History  Procedure Laterality Date  . Cholecystectomy    . Abdominal hysterectomy    . Dilation and curettage of uterus    . Abdominal  surgery    . Tonsillectomy    . Esophagogastroduodenoscopy (egd) with propofol N/A 10/09/2014    Procedure: ESOPHAGOGASTRODUODENOSCOPY (EGD) WITH PROPOFOL;  Surgeon: Inda Castle, MD;  Location: [place];  Service: Endoscopy;  Laterality: N/A;  . Savory dilation N/A 10/09/2014    Procedure: SAVORY DILATION;  Surgeon: Inda Castle, MD;  Location: [place];  Service: Endoscopy;  Laterality: N/A;  . Esophagogastroduodenoscopy N/A 12/12/2014    Procedure: ESOPHAGOGASTRODUODENOSCOPY (EGD);  Surgeon: Inda Castle, MD;  Location: [facility];  Service: Endoscopy;  Laterality: N/A;  with dilation  . Balloon dilation N/A 12/12/2014    Procedure: BALLOON DILATION;  Surgeon: Inda Castle, MD;  Location: [facility];  Service: Endoscopy;  Laterality: N/A;  . Orif ankle fracture Right 07/04/2015    Procedure: OPEN REDUCTION INTERNAL FIXATION (ORIF) ANKLE FRACTURE;  Surgeon: Newt Minion, MD;  Location: [place];  Service: Orthopedics;  Laterality: Right;  OPEN REDUCTION, INTERNAL FIXATION OF RIGHT ANKLE FRACTURE.   . Fracture surgery    . Colonoscopy N/A 02/07/2016    Procedure: COLONOSCOPY;  Surgeon: Doran Stabler, MD;  Location: [facility] ENDOSCOPY;  Service: Endoscopy;  Laterality: N/A;  . Orif ankle fracture Left 02/10/2016    Procedure: OPEN REDUCTION INTERNAL FIXATION (ORIF) ANKLE FRACTURE;  Surgeon: Newt Minion, MD;  Location: [place];  Service: Orthopedics;  Laterality:  Left;   Family History  Problem Relation Age of Onset  . Hypertension Other   . Stroke Other   . Heart attack Other   . Hemachromatosis Other   . Rheum arthritis      both sets of grandparents and father  . Other Mother   . Other Father     failure to thrive   Social History  Substance Use Topics  . Smoking status: Former Smoker -- 3.00 packs/day for 45 years    Types: Cigarettes    Quit date: 12/05/1997  . Smokeless tobacco: Never Used  . Alcohol Use: No   OB History    No data available     Review of  Systems  Constitutional: Negative for fever, chills, diaphoresis, appetite change and fatigue.  HENT: Negative for mouth sores, sore throat and trouble swallowing.   Eyes: Negative for visual disturbance.  Respiratory: Negative for cough, chest tightness, shortness of breath and wheezing.   Cardiovascular: Negative for chest pain.  Gastrointestinal: Positive for diarrhea. Negative for nausea, vomiting, abdominal pain and abdominal distention.  Endocrine: Positive for polydipsia, polyphagia and polyuria.  Genitourinary: Negative for dysuria, frequency and hematuria.  Musculoskeletal: Negative for gait problem.  Skin: Negative for color change, pallor and rash.  Neurological: Positive for light-headedness. Negative for dizziness, syncope and headaches.  Hematological: Does not bruise/bleed easily.  Psychiatric/Behavioral: Negative for behavioral problems and confusion.      Allergies  Carboplatin; Remicade; and Hydromorphone  Home Medications   Prior to Admission medications   Medication Sig Start Date End Date Taking? Authorizing Provider  Budesonide (UCERIS) 9 MG TB24 Take 9 mg by mouth daily. 03/22/16  Yes Nelida Meuse III, MD  fluticasone (CUTIVATE) 0.05 % cream Apply cream to rash/psoriasis on sacrum topically twice daily. Apply 1st before Nizoral cream.   Yes Historical Provider, MD  gabapentin (NEURONTIN) 600 MG tablet Take 600 mg by mouth 2 (two) times daily.   Yes Historical Provider, MD  insulin detemir (LEVEMIR) 100 UNIT/ML injection Inject 20 Units into the skin at bedtime. Inject 15 units SQ at bedtime.   Yes Historical Provider, MD  insulin lispro (HUMALOG) 100 UNIT/ML injection Inject 6 Units into the skin 3 (three) times daily with meals. Check CBG inject SSI Sub Q 3 times daily with meals. DM 0-69 = hypoglycemic protocol, 70 -120 = 0 units 121- 150 = 2 units, 151 - 200 = 3 units, 201 - 250 = 5 units, 251 - 300 = 8 units, 301 - 350 = 11 units, 351 - 400 = 15 units > 400 =  15 units Call MD.   Yes Historical Provider, MD  ketoconazole (NIZORAL) 2 % cream Apply 1 application topically daily. Apply cream to rash/psoriasis to sacrum topically twice a daily. Apply after Cutivate cream.   Yes Historical Provider, MD  solifenacin (VESICARE) 5 MG tablet Take 5 mg by mouth daily.   Yes Historical Provider, MD  Vitamin D, Ergocalciferol, (DRISDOL) 50000 units CAPS capsule Take 1 capsule (50,000 Units total) by mouth every 7 (seven) days. 02/12/16   Ripudeep Krystal Eaton, MD   BP 99/69 mmHg  Pulse 103  Temp(Src) 97.9 F (36.6 C) (Oral)  Resp 15  SpO2 97% Physical Exam  Constitutional: She is oriented to person, place, and time. She appears well-developed and well-nourished. No distress.  HENT:  Head: Normocephalic.  Mouth/Throat:    Eyes: Conjunctivae are normal. Pupils are equal, round, and reactive to light. No scleral icterus.  Neck: Normal  range of motion. Neck supple. No thyromegaly present.  Cardiovascular: Normal rate and regular rhythm.  Exam reveals no gallop and no friction rub.   No murmur heard. Pulmonary/Chest: Effort normal and breath sounds normal. No respiratory distress. She has no wheezes. She has no rales.  Abdominal: Soft. Bowel sounds are normal. She exhibits no distension. There is no tenderness. There is no rebound.  Musculoskeletal: Normal range of motion.  Neurological: She is alert and oriented to person, place, and time.  Skin: Skin is warm and dry. No rash noted.  Psychiatric: She has a normal mood and affect. Her behavior is normal.    ED Course  Procedures (including critical care time) Labs Review Labs Reviewed  BASIC METABOLIC PANEL - Abnormal; Notable for the following:    Sodium 131 (*)    Chloride 96 (*)    CO2 15 (*)    Glucose, Bld 562 (*)    BUN 25 (*)    Creatinine, Ser 1.20 (*)    GFR calc non Af Amer 43 (*)    GFR calc Af Amer 50 (*)    Anion gap 20 (*)    All other components within normal limits  CBC - Abnormal;  Notable for the following:    WBC 11.4 (*)    All other components within normal limits  CBG MONITORING, ED - Abnormal; Notable for the following:    Glucose-Capillary >600 (*)    All other components within normal limits  TROPONIN I  URINALYSIS, ROUTINE W REFLEX MICROSCOPIC (NOT AT [facility])    Imaging Review No results found. I have personally reviewed and evaluated these images and lab results as part of my medical decision-making.   EKG Interpretation None      MDM   Final diagnoses:  Diabetic ketoacidosis without coma associated with type 2 diabetes mellitus (HCC)    Is not tachypneic. Clinically is not tachypneic. She is mentating well.. Started on glucose stabilizer and IV fluids.  Labs show bicarbonate 15. Anion gap 20. Mild pseudohyponatremia 131. Glucose 562.  Discussed with hospitalist. Patient be admitted and continued on insulin drip/glucose stabilizer.  CRITICAL CARE Performed by: Tanna Furry JOSEPH   Total critical care time: 60 minutes  Critical care time was exclusive of separately billable procedures and treating other patients.  Critical care was necessary to treat or prevent imminent or life-threatening deterioration.  Critical care was time spent personally by me on the following activities: development of treatment plan with patient and/or surrogate as well as nursing, discussions with consultants, evaluation of patient's response to treatment, examination of patient, obtaining history from patient or surrogate, ordering and performing treatments and interventions, ordering and review of laboratory studies, ordering and review of radiographic studies, pulse oximetry and re-evaluation of patient's condition.   Tanna Furry, MD 04/14/16 1410

## 2016-04-14 NOTE — ED Notes (Signed)
MD at bedside. 

## 2016-04-14 NOTE — ED Notes (Signed)
Bed: WA19 Expected date:  Expected time:  Means of arrival:  Comments: 

## 2016-04-15 LAB — BASIC METABOLIC PANEL
Anion gap: 5 (ref 5–15)
BUN: 14 mg/dL (ref 6–20)
CHLORIDE: 110 mmol/L (ref 101–111)
CO2: 24 mmol/L (ref 22–32)
CREATININE: 0.62 mg/dL (ref 0.44–1.00)
Calcium: 8.6 mg/dL — ABNORMAL LOW (ref 8.9–10.3)
Glucose, Bld: 126 mg/dL — ABNORMAL HIGH (ref 65–99)
POTASSIUM: 3.6 mmol/L (ref 3.5–5.1)
SODIUM: 139 mmol/L (ref 135–145)

## 2016-04-15 LAB — URINE MICROSCOPIC-ADD ON: RBC / HPF: NONE SEEN RBC/hpf (ref 0–5)

## 2016-04-15 LAB — URINALYSIS, ROUTINE W REFLEX MICROSCOPIC
Bilirubin Urine: NEGATIVE
Glucose, UA: 250 mg/dL — AB
Hgb urine dipstick: NEGATIVE
Ketones, ur: NEGATIVE mg/dL
Nitrite: NEGATIVE
Protein, ur: NEGATIVE mg/dL
Specific Gravity, Urine: 1.009 (ref 1.005–1.030)
pH: 5 (ref 5.0–8.0)

## 2016-04-15 LAB — GLUCOSE, CAPILLARY
Glucose-Capillary: 59 mg/dL — ABNORMAL LOW (ref 65–99)
Glucose-Capillary: 114 mg/dL — ABNORMAL HIGH (ref 65–99)
Glucose-Capillary: 304 mg/dL — ABNORMAL HIGH (ref 65–99)
Glucose-Capillary: 165 mg/dL — ABNORMAL HIGH (ref 65–99)
Glucose-Capillary: 200 mg/dL — ABNORMAL HIGH (ref 65–99)

## 2016-04-15 MED ORDER — INSULIN ASPART 100 UNIT/ML ~~LOC~~ SOLN
6.0000 [IU] | Freq: Three times a day (TID) | SUBCUTANEOUS | Status: DC
  Administered 2016-04-15 – 2016-04-16 (×2): 6 [IU] via SUBCUTANEOUS

## 2016-04-15 MED ORDER — HYDROCORTISONE VALERATE 0.2 % EX CREA
TOPICAL_CREAM | Freq: Two times a day (BID) | CUTANEOUS | Status: DC
  Administered 2016-04-16: 11:00:00 via TOPICAL
  Filled 2016-04-15: qty 15

## 2016-04-15 NOTE — Progress Notes (Signed)
Pt as selected [facility] for [facility]. Pt may benefit with HHRN/PT/NA, will need face to face and orders please.  Private duty sitters list given to pt.

## 2016-04-15 NOTE — Progress Notes (Signed)
Inpatient Diabetes Program Recommendations  AACE/ADA: New Consensus Statement on Inpatient Glycemic Control (2015)  Target Ranges:  Prepandial:   less than 140 mg/dL      Peak postprandial:   less than 180 mg/dL (1-2 hours)      Critically ill patients:  140 - 180 mg/dL   Results for Megan [name], Megan [name] (MRN [id number]) as of 04/15/2016 09:52  Ref. Range 04/14/2016 11:40 04/14/2016 12:56 04/14/2016 14:04 04/14/2016 15:02 04/14/2016 15:57 04/14/2016 17:02 04/14/2016 17:53 04/14/2016 18:53 04/14/2016 19:59 04/14/2016 21:05 04/14/2016 22:14 04/14/2016 22:54  Glucose-Capillary Latest Ref Range: 65-99 mg/dL >600 (HH) 571 (HH) 386 (H) 340 (H) 287 (H) 254 (H) 148 (H) 117 (H) 90 89 116 (H) 139 (H)   Results for Megan [name], Megan [name] (MRN [id number]) as of 04/15/2016 09:52  Ref. Range 04/15/2016 07:15 04/15/2016 08:06  Glucose-Capillary Latest Ref Range: 65-99 mg/dL 59 (L) 114 (H)    Admit DKA  History: DM, Lung Cancer  Home DM Meds: Levemir 20 units QHS        Humalog SSI tidwc  Current Insulin Orders: Levemir 20 units QHS      Novolog Moderate Correction Scale/ SSI (0-15 units) TID AC + HS     -Note patient transitioned off IV Insulin drip last PM.  Was given Levemir 20 units at 9pm last evening.  -Hypoglycemic this AM (CBG 59 mg/dl).       MD- Please consider decreasing Levemir slightly to 18 units QHS (20% reduction)     --Will follow patient during hospitalization--  [name] RN, MSN, CDE Diabetes Coordinator Inpatient Glycemic Control Team Team Pager: [phone] (8a-5p)

## 2016-04-15 NOTE — Progress Notes (Signed)
PROGRESS NOTE    Megan [name]  SPQ:[id number] DOB: December 26, 1940 DOA: 04/14/2016 PCP: No PCP Per Patient  Outpatient Specialists:   Brief Narrative: 75 year old female with history of DM with Neuropathy, cancer of right lung, psoriasis, RA and Hypertension. According to the patient, her blood sugar had been well controlled until she ate [place] last night. Subsequently, patient has found it difficult controlling his blood sugar, with associated polyuria and polydipsia. On presentation to the hospital, patient was found to be in DKA. No headache, no neck pain, no chest pain, no GI symptoms. No fever or chills.  Assessment & Plan:   Active Problems:   DKA (diabetic ketoacidoses) (HCC)  Assessment/Plan  Diabetic ketoacidosis, resolved but blood sugar is still uncontrolled  Volume depletion, resolving   Continue hydration.  Resume home insulin regimen with sliding scale coverage. Adjust insulin accordingly prior to DC. Likely DC in am with Home Health Nursing/PT and aide.  Diabetes education  Monitor and correct electrolytes  Further management will depend on hospital course  DVT prophylaxis: Lovenox Code Status: Full Family Communication:  Disposition Plan: Home  Consults called: None  Admission status: inpatient   Subjective: No new complaints. Blood sugar remains uncontrolled.  Objective: Filed Vitals:   04/15/16 0020 04/15/16 0458 04/15/16 0912 04/15/16 1413  BP: 112/58 113/50 132/71 130/72  Pulse: 79 78 87 84  Temp: 98.2 F (36.8 C) 97.8 F (36.6 C) 98.4 F (36.9 C) 97.7 F (36.5 C)  TempSrc: Oral Oral Oral Oral  Resp: '20 16 18 16  '$ Height:      Weight:      SpO2: 99% 99% 98% 98%    Intake/Output Summary (Last 24 hours) at 04/15/16 1501 Last data filed at 04/15/16 0807  Gross per 24 hour  Intake 1301.73 ml  Output   2350 ml  Net -1048.27 ml   Filed Weights   04/14/16 1600  Weight: 60 kg (132 lb 4.4 oz)     Examination:  Constitutional:   Appears calm and comfortable Eyes:   No pallor. No jaundice.  ENMT:   external ears, nose appear normal Neck:   Neck is supple. No JVD Respiratory:   CTA bilaterally, no w/r/r.   Respiratory effort normal. No retractions or accessory muscle use Cardiovascular:   S1S2  No LE extremity edema Abdomen:   Abdomen is soft and non tender. Organs are difficult to assess. Neurologic:   Awake and alert. Moves all limbs.te.    CBC:  Recent Labs Lab 04/14/16 1148 04/14/16 1606  WBC 11.4* 7.9  HGB 13.5 11.9*  HCT 41.8 35.3*  MCV 87.6 85.5  PLT 221 333   Basic Metabolic Panel:  Recent Labs Lab 04/14/16 1148 04/14/16 1606 04/15/16 0454  NA 131* 133* 139  K 3.8 3.4* 3.6  CL 96* 101 110  CO2 15* 25 24  GLUCOSE 562* 252* 126*  BUN 25* 22* 14  CREATININE 1.20* 0.83 0.62  CALCIUM 9.2 8.5* 8.6*  MG  --  1.7  --   PHOS  --  3.2  --    GFR: Estimated Creatinine Clearance: 57.8 mL/min (by C-G formula based on Cr of 0.62). Liver Function Tests: No results for input(s): AST, ALT, ALKPHOS, BILITOT, PROT, ALBUMIN in the last 168 hours. No results for input(s): LIPASE, AMYLASE in the last 168 hours. No results for input(s): AMMONIA in the last 168 hours. Coagulation Profile: No results for input(s): INR, PROTIME in the last 168 hours. Cardiac Enzymes:  Recent  Labs Lab 04/14/16 1148  TROPONINI <0.03   BNP (last 3 results) No results for input(s): PROBNP in the last 8760 hours. HbA1C: No results for input(s): HGBA1C in the last 72 hours. CBG:  Recent Labs Lab 04/14/16 2214 04/14/16 2254 04/15/16 0715 04/15/16 0806 04/15/16 1102  GLUCAP 116* 139* 59* 114* 304*   Lipid Profile: No results for input(s): CHOL, HDL, LDLCALC, TRIG, CHOLHDL, LDLDIRECT in the last 72 hours. Thyroid Function Tests: No results for input(s): TSH, T4TOTAL, FREET4, T3FREE, THYROIDAB in the last 72 hours. Anemia Panel: No results  for input(s): VITAMINB12, FOLATE, FERRITIN, TIBC, IRON, RETICCTPCT in the last 72 hours. Urine analysis:    Component Value Date/Time   COLORURINE YELLOW 04/15/2016 0358   APPEARANCEUR CLEAR 04/15/2016 0358   LABSPEC 1.009 04/15/2016 0358   LABSPEC 1.030 08/25/2015 1130   PHURINE 5.0 04/15/2016 0358   PHURINE 6.0 08/25/2015 1130   GLUCOSEU 250* 04/15/2016 0358   GLUCOSEU Negative 08/25/2015 1130   HGBUR NEGATIVE 04/15/2016 0358   HGBUR Negative 08/25/2015 1130   BILIRUBINUR NEGATIVE 04/15/2016 0358   BILIRUBINUR Negative 08/25/2015 Megan [name] 04/15/2016 0358   KETONESUR Negative 08/25/2015 1130   PROTEINUR NEGATIVE 04/15/2016 0358   PROTEINUR 30 08/25/2015 1130   UROBILINOGEN 0.2 08/25/2015 1130   UROBILINOGEN 0.2 07/01/2015 0345   NITRITE NEGATIVE 04/15/2016 0358   NITRITE Negative 08/25/2015 1130   LEUKOCYTESUR TRACE* 04/15/2016 0358   LEUKOCYTESUR Negative 08/25/2015 1130   Sepsis Labs: '@LABRCNTIP'$ (procalcitonin:4,lacticidven:4)  ) Recent Results (from the past 240 hour(s))  MRSA PCR Screening     Status: None   Collection Time: 04/14/16  3:59 PM  Result Value Ref Range Status   MRSA by PCR NEGATIVE NEGATIVE Final    Comment:        The GeneXpert MRSA Assay (FDA approved for NASAL specimens only), is one component of a comprehensive MRSA colonization surveillance program. It is not intended to diagnose MRSA infection nor to guide or monitor treatment for MRSA infections.          Radiology Studies: No results found.      Scheduled Meds: . budesonide  9 mg Oral Daily  . darifenacin  7.5 mg Oral Daily  . enoxaparin (LOVENOX) injection  40 mg Subcutaneous Q24H  . gabapentin  600 mg Oral BID  . hydrocortisone valerate cream   Topical BID  . insulin aspart  0-15 Units Subcutaneous TID WC  . insulin aspart  0-5 Units Subcutaneous QHS  . insulin aspart  6 Units Subcutaneous TID WC  . insulin detemir  20 Units Subcutaneous QHS  .  ketoconazole  1 application Topical Daily   Continuous Infusions: . sodium chloride 75 mL/hr at 04/15/16 0948     LOS: 1 day    Time spent: 25 Minutes.    Dana Allan, MD  [facility] Pager #: [phone] 7PM-7AM contact night coverage as above

## 2016-04-16 DIAGNOSIS — E1310 Other specified diabetes mellitus with ketoacidosis without coma: Principal | ICD-10-CM

## 2016-04-16 LAB — GLUCOSE, CAPILLARY: Glucose-Capillary: 192 mg/dL — ABNORMAL HIGH (ref 65–99)

## 2016-04-16 NOTE — Progress Notes (Signed)
Patient discharged.  Leaving with personal belongings.  Accompanied by friend.  Denies pain.  Reports understanding of discharge instructions.  No s/s of distress.  Room air.  No complaints.

## 2016-04-16 NOTE — Discharge Summary (Signed)
Physician Discharge Summary  Megan [name] DJS:[id number] DOB: Oct 25, 1941 DOA: 04/14/2016  PCP: No PCP Per Patient  Admit date: 04/14/2016 Discharge date: 04/16/2016  Time spent: 35 minutes  Recommendations for Outpatient Follow-up:  Follow up PCP in 2 weeks   Discharge Diagnoses:  Active Problems:   DKA (diabetic ketoacidoses) ([place])   Discharge Condition: Stable  Diet recommendation: Carb modified diet  Filed Weights   04/14/16 1600  Weight: 60 kg (132 lb 4.4 oz)    History of present illness:  75 year old female with history of DM with Neuropathy, cancer of right lung, psoriasis, RA and Hypertension. According to the patient, her blood sugar had been well controlled until she ate [place] last night. Subsequently, patient has found it difficult controlling his blood sugar, with associated polyuria and polydipsia. On presentation to the hospital, patient was found to be in DKA. No headache, no neck pain, no chest pain, no GI symptoms. No fever or chills.   Hospital Course:   Diabetic ketoacidosis- resolved, patient came with DKA which is now resolved. AG is 5. Blood glucose is Normal.  Diabetes mellitus- patient takes regular insulin along with Levemir at home. As per patient she ate one bowl of [place]apple after that blood sugar went up .Will continue with the same dose, patient will check blood glucose at home and call her PCP if again has elevated blood sugar.  Procedures:  None  Consultations:  None   Discharge Exam: Filed Vitals:   04/15/16 2102 04/16/16 0518  BP: 143/89 149/79  Pulse: 80 78  Temp: 98.7 F (37.1 C) 98.1 F (36.7 C)  Resp: 20 18    General:  Appears in no acute distress Cardiovascular: S1-S2 regular                                                 Respiratory: clear to auscultation bilaterally   D ischarge Instructions   Discharge Instructions    Diet - low sodium heart healthy    Complete by:  As directed   Diabetic diet     Increase activity slowly    Complete by:  As directed           Current Discharge Medication List    CONTINUE these medications which have NOT CHANGED   Details  Budesonide (UCERIS) 9 MG TB24 Take 9 mg by mouth daily. Qty: 30 tablet, Refills: 3    fluticasone (CUTIVATE) 0.05 % cream Apply cream to rash/psoriasis on sacrum topically twice daily. Apply 1st before Nizoral cream.    gabapentin (NEURONTIN) 600 MG tablet Take 600 mg by mouth 2 (two) times daily.    insulin detemir (LEVEMIR) 100 UNIT/ML injection Inject 20 Units into the skin at bedtime. Inject 15 units SQ at bedtime.    insulin lispro (HUMALOG) 100 UNIT/ML injection Inject 6 Units into the skin 3 (three) times daily with meals. Check CBG inject SSI Sub Q 3 times daily with meals. DM 0-69 = hypoglycemic protocol, 70 -120 = 0 units 121- 150 = 2 units, 151 - 200 = 3 units, 201 - 250 = 5 units, 251 - 300 = 8 units, 301 - 350 = 11 units, 351 - 400 = 15 units > 400 = 15 units Call MD.    ketoconazole (NIZORAL) 2 % cream Apply 1 application topically daily. Apply cream to  rash/psoriasis to sacrum topically twice a daily. Apply after Cutivate cream.    solifenacin (VESICARE) 5 MG tablet Take 5 mg by mouth daily.    Vitamin D, Ergocalciferol, (DRISDOL) 50000 units CAPS capsule Take 1 capsule (50,000 Units total) by mouth every 7 (seven) days. Qty: 30 capsule       Allergies  Allergen Reactions  . Carboplatin Other (See Comments)    Neuropathy  . Remicade [Infliximab] Anaphylaxis  . Hydromorphone Rash      The results of significant diagnostics from this hospitalization (including imaging, microbiology, ancillary and laboratory) are listed below for reference.    Significant Diagnostic Studies: No results found.  Microbiology: Recent Results (from the past 240 hour(s))  MRSA PCR Screening     Status: None   Collection Time: 04/14/16  3:59 PM  Result Value Ref Range Status   MRSA by PCR NEGATIVE NEGATIVE Final     Comment:        The GeneXpert MRSA Assay (FDA approved for NASAL specimens only), is one component of a comprehensive MRSA colonization surveillance program. It is not intended to diagnose MRSA infection nor to guide or monitor treatment for MRSA infections.      Labs: Basic Metabolic Panel:  Recent Labs Lab 04/14/16 1148 04/14/16 1606 04/15/16 0454  NA 131* 133* 139  K 3.8 3.4* 3.6  CL 96* 101 110  CO2 15* 25 24  GLUCOSE 562* 252* 126*  BUN 25* 22* 14  CREATININE 1.20* 0.83 0.62  CALCIUM 9.2 8.5* 8.6*  MG  --  1.7  --   PHOS  --  3.2  --    Liver Function Tests: No results for input(s): AST, ALT, ALKPHOS, BILITOT, PROT, ALBUMIN in the last 168 hours. No results for input(s): LIPASE, AMYLASE in the last 168 hours. No results for input(s): AMMONIA in the last 168 hours. CBC:  Recent Labs Lab 04/14/16 1148 04/14/16 1606  WBC 11.4* 7.9  HGB 13.5 11.9*  HCT 41.8 35.3*  MCV 87.6 85.5  PLT 221 173   Cardiac Enzymes:  Recent Labs Lab 04/14/16 1148  TROPONINI <0.03    CBG:  Recent Labs Lab 04/15/16 0806 04/15/16 1102 04/15/16 1711 04/15/16 2138 04/16/16 0743  GLUCAP 114* 304* 165* 200* 192*       Signed:  Eleonore Chiquito S MD.  [facility] 04/16/2016, 10:07 AM

## 2016-05-03 ENCOUNTER — Other Ambulatory Visit: Payer: Self-pay | Admitting: Nurse Practitioner

## 2016-05-04 ENCOUNTER — Ambulatory Visit (INDEPENDENT_AMBULATORY_CARE_PROVIDER_SITE_OTHER): Payer: Medicare Other | Admitting: Internal Medicine

## 2016-05-04 ENCOUNTER — Encounter: Payer: Self-pay | Admitting: Internal Medicine

## 2016-05-04 VITALS — BP 108/70 | HR 106 | Ht 66.0 in | Wt 141.0 lb

## 2016-05-04 DIAGNOSIS — E139 Other specified diabetes mellitus without complications: Secondary | ICD-10-CM | POA: Insufficient documentation

## 2016-05-04 DIAGNOSIS — E1310 Other specified diabetes mellitus with ketoacidosis without coma: Secondary | ICD-10-CM

## 2016-05-04 DIAGNOSIS — Z794 Long term (current) use of insulin: Secondary | ICD-10-CM

## 2016-05-04 DIAGNOSIS — E1122 Type 2 diabetes mellitus with diabetic chronic kidney disease: Secondary | ICD-10-CM | POA: Insufficient documentation

## 2016-05-04 DIAGNOSIS — E1110 Type 2 diabetes mellitus with ketoacidosis without coma: Secondary | ICD-10-CM

## 2016-05-04 LAB — POCT GLYCOSYLATED HEMOGLOBIN (HGB A1C): Hemoglobin A1C: 10.5

## 2016-05-04 MED ORDER — GLUCAGON (RDNA) 1 MG IJ KIT: 1.0000 mg | PACK | Freq: Once | INTRAMUSCULAR | Status: DC | PRN

## 2016-05-04 NOTE — Addendum Note (Signed)
Addended by: Caprice Beaver T on: 05/04/2016 02:21 PM   Modules accepted: Orders

## 2016-05-04 NOTE — Patient Instructions (Addendum)
Please continue: - Levemir 20 units at bedtime. - Humalog 6 units 3x a day 15 min before a meal  Change: - Humalog Sliding scale: 150-200: + 2 unit 201-250: + 4 units 251-300: + 6 units 301-350: + 8 units >350: + 9 units  Please return in 1.5 months with your sugar log.   Please let me know if the sugars are consistently <80 or >200.  PATIENT INSTRUCTIONS FOR TYPE 2 DIABETES:  **Please join MyChart!** - see attached instructions about how to join if you have not done so already.  DIET AND EXERCISE Diet and exercise is an important part of diabetic treatment.  We recommended aerobic exercise in the form of brisk walking (working between 40-60% of maximal aerobic capacity, similar to brisk walking) for 150 minutes per week (such as 30 minutes five days per week) along with 3 times per week performing 'resistance' training (using various gauge rubber tubes with handles) 5-10 exercises involving the major muscle groups (upper body, lower body and core) performing 10-15 repetitions (or near fatigue) each exercise. Start at half the above goal but build slowly to reach the above goals. If limited by weight, joint pain, or disability, we recommend daily walking in a swimming pool with water up to waist to reduce pressure from joints while allow for adequate exercise.    BLOOD GLUCOSES Monitoring your blood glucoses is important for continued management of your diabetes. Please check your blood glucoses 2-4 times a day: fasting, before meals and at bedtime (you can rotate these measurements - e.g. one day check before the 3 meals, the next day check before 2 of the meals and before bedtime, etc.).   HYPOGLYCEMIA (low blood sugar) Hypoglycemia is usually a reaction to not eating, exercising, or taking too much insulin/ other diabetes drugs.  Symptoms include tremors, sweating, hunger, confusion, headache, etc. Treat IMMEDIATELY with 15 grams of Carbs: . 4 glucose tablets .  cup regular  juice/soda . 2 tablespoons raisins . 4 teaspoons sugar . 1 tablespoon honey Recheck blood glucose in 15 mins and repeat above if still symptomatic/blood glucose <100.  RECOMMENDATIONS TO REDUCE YOUR RISK OF DIABETIC COMPLICATIONS: * Take your prescribed MEDICATION(S) * Follow a DIABETIC diet: Complex carbs, fiber rich foods, (monounsaturated and polyunsaturated) fats * AVOID saturated/trans fats, high fat foods, >2,300 mg salt per day. * EXERCISE at least 5 times a week for 30 minutes or preferably daily.  * DO NOT SMOKE OR DRINK more than 1 drink a day. * Check your FEET every day. Do not wear tightfitting shoes. Contact us if you develop an ulcer * See your EYE doctor once a year or more if needed * Get a FLU shot once a year * Get a PNEUMONIA vaccine once before and once after age 79 years  GOALS:  * Your Hemoglobin A1c of <7%  * fasting sugars need to be <130 * after meals sugars need to be <180 (2h after you start eating) * Your Systolic BP should be 967 or lower  * Your Diastolic BP should be 80 or lower  * Your HDL (Good Cholesterol) should be 40 or higher  * Your LDL (Bad Cholesterol) should be 100 or lower. * Your Triglycerides should be 150 or lower  * Your Urine microalbumin (kidney function) should be <30 * Your Body Mass Index should be 25 or lower    Please consider the following ways to cut down carbs and fat and increase fiber and micronutrients in your diet: -  substitute whole grain for white bread or pasta - substitute brown rice for white rice - substitute 90-calorie flat bread pieces for slices of bread when possible - substitute sweet potatoes or yams for white potatoes - substitute humus for margarine - substitute tofu for cheese when possible - substitute almond or rice milk for regular milk (would not drink soy milk daily due to concern for soy estrogen influence on breast cancer risk) - substitute dark chocolate for other sweets when possible -  substitute water - can add lemon or orange slices for taste - for diet sodas (artificial sweeteners will trick your body that you can eat sweets without getting calories and will lead you to overeating and weight gain in the long run) - do not skip breakfast or other meals (this will slow down the metabolism and will result in more weight gain over time)  - can try smoothies made from fruit and almond/rice milk in am instead of regular breakfast - can also try old-fashioned (not instant) oatmeal made with almond/rice milk in am - order the dressing on the side when eating salad at a restaurant (pour less than half of the dressing on the salad) - eat as little meat as possible - can try juicing, but should not forget that juicing will get rid of the fiber, so would alternate with eating raw veg./fruits or drinking smoothies - use as little oil as possible, even when using olive oil - can dress a salad with a mix of balsamic vinegar and lemon juice, for e.g. - use agave nectar, stevia sugar, or regular sugar rather than artificial sweateners - steam or broil/roast veggies  - snack on veggies/fruit/nuts (unsalted, preferably) when possible, rather than processed foods - reduce or eliminate aspartame in diet (it is in diet sodas, chewing gum, etc) Read the labels!  Try to read Dr. Janene Harvey book: "Program for Reversing Diabetes" for other ideas for healthy eating.

## 2016-05-04 NOTE — Progress Notes (Signed)
Patient ID: Megan [name], female   DOB: [birth year], 75 y.o.   MRN: [id number]  HPI: [name] is a 75 y.o.-year-old female, self-referred for management of DM2, dx in 2013, insulin-dependent since 2014, uncontrolled, with complications (DKA in the setting of dehydration). She was seen by Dr. Wilson Singer before.  I reviewed patient's chart: She has a complicated medical history, with a diagnosis of ulcerative colitis with significant diarrhea that caused dehydration in the past and episodes of DKA. She had to recent admissions for this in 02/26/2016 and 04/14/2016. While in rehabilitation, her CBGs dropped to 30s on her previous dose of insulin, therefore, her long-acting insulin was decreased. This caused her CBGs to stay in the 200s.  She also has a history of lung cancer diagnosed 09/2013. She had chemotherapy and radiation therapy for this, now immunotherapy. She has malignant cachexia.  Last hemoglobin A1c was: Lab Results  Component Value Date   HGBA1C 12.8* 02/11/2016  12/30/2015: 12% 09/18/2015: 11.3% 06/17/2015: 7.5% 04/08/2015: 10.9% 09/10/2014: 8.5%  11/04/2013: 6.2%  Pt is on a regimen of: - Levemir 20 units at bedtime units after supper - Humalog 6 units 3x a day, before meals + sliding scale: 0-69 = hypoglycemic protocol 70 -120 = 0 units  121- 150 = 2 units 151 - 200 = 3 units 201 - 250 = 5 units 251 - 300 = 8 units 301 - 350 = 11 units 351 - 400 = 15 units  > 400 = 15 units, Call MD She was on Metformin >> stopped when starting insulin.  Pt checks her sugars 1-2 a day and they are: - am: 68, 71-271, 360 - 2h after b'fast: n/c - before lunch: n/c - 2h after lunch: n/c - before dinner: n/c - 2h after dinner: n/c - bedtime: n/c - nighttime: n/c No lows. Lowest sugar was 30 >> 68; she has hypoglycemia awareness at 50-60.  Highest sugar was 600s at admission.    Glucometer: ?  Pt's meals are: - Breakfast: Cereal or oatmeal with blueberries or eggs  with sausage - Lunch: Sandwich and soup - Dinner: Meat, vegetables, small amount of starch - Snacks: 3-4 a day: Peanuts  - no CKD, last BUN/creatinine:  Lab Results  Component Value Date   BUN 14 04/15/2016   BUN 22* 04/14/2016   CREATININE 0.62 04/15/2016   CREATININE 0.83 04/14/2016   - last set of lipids: 04/04/2013: 141/145/41/71 No results found for: CHOL, HDL, LDLCALC, LDLDIRECT, TRIG, CHOLHDL - last eye exam was in 12/2015 ? DR. Dr. Lady Gary. - + numbness and tingling in her feet. PN from ChTx.  Pt does not have FH of DM.  ROS: Constitutional: no weight gain/loss, no fatigue, + hot flushes, + excessive urination, + nocturia Eyes: +  blurry vision, no xerophthalmia ENT: no sore throat, no nodules palpated in throat, no dysphagia/odynophagia, no hoarseness Cardiovascular: no CP/SOB/palpitations/leg swelling Respiratory: + cough/no SOB Gastrointestinal: no N/V/D/C Musculoskeletal: no muscle/joint aches Skin: no rashes Neurological: no tremors/+ numbness/+ tingling/no dizziness Psychiatric: no depression/anxiety  Past Medical History  Diagnosis Date  . Hypertension   . Rheumatoid arthritis ([place])   . GERD (gastroesophageal reflux disease)     no meds for  . Hx of radiation therapy 12/16/13-01/30/14    lung 66Gy  . Neuropathy ([place])   . Shortness of breath dyspnea     with exertion  . History of hiatal hernia   . Diabetes mellitus     insulin  . Psoriasis   .  Cancer ([place]) dx'd 09/2013    Lung ca  . Malignant neoplasm of right upper lobe of lung ([place]) 11/28/2013   Past Surgical History  Procedure Laterality Date  . Cholecystectomy    . Abdominal hysterectomy    . Dilation and curettage of uterus    . Abdominal surgery    . Tonsillectomy    . Esophagogastroduodenoscopy (egd) with propofol N/A 10/09/2014    Procedure: ESOPHAGOGASTRODUODENOSCOPY (EGD) WITH PROPOFOL;  Surgeon: Inda Castle, MD;  Location: [place];  Service: Endoscopy;  Laterality: N/A;  .  [place] dilation N/A 10/09/2014    Procedure: SAVORY DILATION;  Surgeon: Inda Castle, MD;  Location: [place];  Service: Endoscopy;  Laterality: N/A;  . Esophagogastroduodenoscopy N/A 12/12/2014    Procedure: ESOPHAGOGASTRODUODENOSCOPY (EGD);  Surgeon: Inda Castle, MD;  Location: [facility];  Service: Endoscopy;  Laterality: N/A;  with dilation  . Balloon dilation N/A 12/12/2014    Procedure: BALLOON DILATION;  Surgeon: Inda Castle, MD;  Location: [facility];  Service: Endoscopy;  Laterality: N/A;  . Orif ankle fracture Right 07/04/2015    Procedure: OPEN REDUCTION INTERNAL FIXATION (ORIF) ANKLE FRACTURE;  Surgeon: Newt Minion, MD;  Location: [place];  Service: Orthopedics;  Laterality: Right;  OPEN REDUCTION, INTERNAL FIXATION OF RIGHT ANKLE FRACTURE.   . Fracture surgery    . Colonoscopy N/A 02/07/2016    Procedure: COLONOSCOPY;  Surgeon: Doran Stabler, MD;  Location: [facility];  Service: Endoscopy;  Laterality: N/A;  . Orif ankle fracture Left 02/10/2016    Procedure: OPEN REDUCTION INTERNAL FIXATION (ORIF) ANKLE FRACTURE;  Surgeon: Newt Minion, MD;  Location: [place];  Service: Orthopedics;  Laterality: Left;   Social History   Social History  . Marital Status: Divorced    Spouse Name: N/A  . Number of Children: 0   Occupational History  . Retired Pharmacist, hospital Other   Social History Main Topics  . Smoking status: Former Smoker -- 3.00 packs/day for 45 years    Types: Cigarettes    Quit date: 12/05/1997  . Smokeless tobacco: Never Used  . Alcohol Use: No  . Drug Use: No   Social History Narrative   Patient lives at home alone.   Caffeine Use: 2 cups daily   Current Outpatient Prescriptions on File Prior to Visit  Medication Sig Dispense Refill  . Budesonide (UCERIS) 9 MG TB24 Take 9 mg by mouth daily. 30 tablet 3  . fluticasone (CUTIVATE) 0.05 % cream Apply cream to rash/psoriasis on sacrum topically twice daily. Apply 1st before Nizoral cream.    . gabapentin  (NEURONTIN) 600 MG tablet Take 600 mg by mouth 2 (two) times daily.    . insulin detemir (LEVEMIR) 100 UNIT/ML injection Inject 20 Units into the skin at bedtime. Inject 15 units SQ at bedtime.    . insulin lispro (HUMALOG) 100 UNIT/ML injection Inject 6 Units into the skin 3 (three) times daily with meals. Check CBG inject SSI Sub Q 3 times daily with meals. DM 0-69 = hypoglycemic protocol, 70 -120 = 0 units 121- 150 = 2 units, 151 - 200 = 3 units, 201 - 250 = 5 units, 251 - 300 = 8 units, 301 - 350 = 11 units, 351 - 400 = 15 units > 400 = 15 units Call MD.    . ketoconazole (NIZORAL) 2 % cream Apply 1 application topically daily. Apply cream to rash/psoriasis to sacrum topically twice a daily. Apply after Cutivate cream.    .  solifenacin (VESICARE) 5 MG tablet Take 5 mg by mouth daily.    . Vitamin D, Ergocalciferol, (DRISDOL) 50000 units CAPS capsule Take 1 capsule (50,000 Units total) by mouth every 7 (seven) days. (Patient not taking: Reported on 05/04/2016) 30 capsule    Current Facility-Administered Medications on File Prior to Visit  Medication Dose Route Frequency Provider Last Rate Last Dose  . sodium chloride 0.9 % injection 10 mL  10 mL Intracatheter PRN Curt Bears, MD   10 mL at 12/30/13 1121   Allergies  Allergen Reactions  . Carboplatin Other (See Comments)    Neuropathy  . Remicade [Infliximab] Anaphylaxis  . Hydromorphone Rash   Family History  Problem Relation Age of Onset  . Hypertension Other   . Stroke Other   . Heart attack Other   . Hemachromatosis Other   . Rheum arthritis      both sets of grandparents and father  . Other Mother   . Other Father     failure to thrive   PE: BP 108/70 mmHg  Pulse 106  Ht '5\' 6"'$  (1.676 m)  Wt 141 lb (63.957 kg)  BMI 22.77 kg/m2  SpO2 96% Wt Readings from Last 3 Encounters:  05/04/16 141 lb (63.957 kg)  04/14/16 132 lb 4.4 oz (60 kg)  03/31/16 133 lb (60.328 kg)   Constitutional: Thin, in NAD Eyes: PERRLA, EOMI, no  exophthalmos ENT: moist mucous membranes, no thyromegaly, no cervical lymphadenopathy Cardiovascular: tachycardia, RR, No MRG, + bilateral. Peri-ankle edema (pitting) Respiratory: CTA B Gastrointestinal: abdomen soft, NT, ND, BS+ Musculoskeletal: no deformities, strength intact in all 4 Skin: moist, warm, + ungual rashes - both fingers and toes Neurological: no tremor with outstretched hands, DTR normal in all 4  ASSESSMENT: 1. DM2, insulin-dependent, uncontrolled, with complications - DKA in the setting of dehydration  Pulmonologist: Dr. Chase Caller Oncologist: Dr. Earlie Server Cardiologist: Dr. Sallyanne Kuster Ophthalmologist: Dr. Clifton James  PLAN:  1. Patient with long-standing, uncontrolled diabetes, on basal-bolus insulin regimen, which became insufficient. She had 2 episodes of DKA in the context of dehydration from uncontrolled diarrhea from colitis. The diarrhea has improved, and she now has approximately 3 stools a day, which are more formed compared to before. - Her sugars have improved since her last HbA1c in 01/2016, which was 12.8%. Today, HbA1c was 10.5%. - At this visit, we reviewed her insulin regimen and discussed that we will need to stay with insulin for now (although I plan to check her for type I at next visit), but we need to optimize the regimen. She is taking approximately 50-50 basal and bolus insulin, which is ideal. Her sliding scale is ok, but at higher sugars, he will give her up to 21 units at the meal, which I believe is too much. We'll adjust the sliding scale now. Ideally, she will have a more flexible regiment of different doses depending on the carbohydrate load or the size of the meals. She is planning to start a diet that gives her fixed carbohydrates with each meal. We will reevaluate this at next visit and adjust the Humalog as needed. For now, I strongly advised her to start checking sugars before every meal, since now she is only checking it in the morning, and  therefore cannot use the sliding scale with the rest of the meals. - I suggested to:  Patient Instructions  Please continue: - Levemir 20 units at bedtime. - Humalog 6 units 3x a day 15 min before a meal  Change: - Humalog  Sliding scale: 150-200: + 2 unit 201-250: + 4 units 251-300: + 6 units 301-350: + 8 units >350: + 9 units  Please return in 1.5 months with your sugar log.   Please let me know if the sugars are consistently <80 or >200.  - Strongly advised her to start checking sugars at different times of the day - check 3 times a day, rotating checks - given sugar log and advised how to fill it and to bring it at next appt  - given foot care handout and explained the principles  - given instructions for hypoglycemia management "15-15 rule"  - advised for yearly eye exams >> she is up-to-date - Return to clinic in 1.5 mo with sugar log   Philemon Kingdom, MD PhD [facility]

## 2016-05-06 ENCOUNTER — Telehealth: Payer: Self-pay

## 2016-05-06 ENCOUNTER — Telehealth: Payer: Self-pay | Admitting: Internal Medicine

## 2016-05-06 NOTE — Telephone Encounter (Signed)
Returned patient phone call, no answer; left message to return call regarding sliding scale.

## 2016-05-06 NOTE — Telephone Encounter (Signed)
Patient power of Attorney,George Beckerdite would like to take to you about patient sliding scale,  [phone]

## 2016-05-10 ENCOUNTER — Encounter: Payer: Self-pay | Admitting: Gastroenterology

## 2016-05-20 ENCOUNTER — Other Ambulatory Visit: Payer: Self-pay | Admitting: Internal Medicine

## 2016-05-25 ENCOUNTER — Telehealth: Payer: Self-pay | Admitting: Internal Medicine

## 2016-05-25 ENCOUNTER — Other Ambulatory Visit: Payer: Self-pay

## 2016-05-25 NOTE — Telephone Encounter (Signed)
Megan [name] called from [facility] stated patient, patient need  31 gauge 8 mm pen needles. Please advise

## 2016-05-26 ENCOUNTER — Other Ambulatory Visit: Payer: Self-pay

## 2016-05-26 MED ORDER — INSULIN PEN NEEDLE 32G X 5 MM MISC: 3 refills | Status: DC

## 2016-05-26 NOTE — Telephone Encounter (Signed)
I sent in patient needles to pharmacy, called patient to notify her they were sent in.

## 2016-05-26 NOTE — Telephone Encounter (Signed)
Pt calling to see why we have not called in the rx for her needles yet, I informed her that we were made aware of her need yesterday at 2:30 pm, the MD just responded on what to call in at 7:30 AM, she is upset because she is completely out. Upon sympathizing with the pt and reassuring her that we would be able to get to this today, she demanded it be done now. I requested and attempted to educate her on the need to give the office more than a 3 hour notice if she is out of meds, she said we have too many rules and hung up the phone.  Almyra Free please call in asap for this pt and let her know it is completed.

## 2016-05-26 NOTE — Telephone Encounter (Signed)
No, let's order 32 gauge, 5 mm needles

## 2016-06-14 ENCOUNTER — Other Ambulatory Visit: Payer: Self-pay | Admitting: Internal Medicine

## 2016-06-16 ENCOUNTER — Ambulatory Visit (INDEPENDENT_AMBULATORY_CARE_PROVIDER_SITE_OTHER): Payer: Medicare Other | Admitting: Internal Medicine

## 2016-06-16 ENCOUNTER — Encounter: Payer: Self-pay | Admitting: Internal Medicine

## 2016-06-16 VITALS — BP 120/62 | HR 96 | Ht 65.5 in | Wt 152.0 lb

## 2016-06-16 DIAGNOSIS — Z794 Long term (current) use of insulin: Secondary | ICD-10-CM

## 2016-06-16 DIAGNOSIS — E1310 Other specified diabetes mellitus with ketoacidosis without coma: Secondary | ICD-10-CM | POA: Diagnosis not present

## 2016-06-16 DIAGNOSIS — E1110 Type 2 diabetes mellitus with ketoacidosis without coma: Secondary | ICD-10-CM

## 2016-06-16 MED ORDER — INSULIN LISPRO 100 UNIT/ML (KWIKPEN): PEN_INJECTOR | SUBCUTANEOUS | 5 refills | Status: DC

## 2016-06-16 MED ORDER — ONETOUCH VERIO VI STRP: ORAL_STRIP | 11 refills | Status: DC

## 2016-06-16 NOTE — Progress Notes (Signed)
Patient ID: Megan [name], female   DOB: [birth year], 75 y.o.   MRN: [id number]  HPI: Megan [name] is a 75 y.o.-year-old female, returning for f/u for DM2, dx in 2013, insulin-dependent since 2014, uncontrolled, with complications (DKA in the setting of dehydration). Last visit 1.5 mo ago.  Reviewed hx: She has a complicated medical history, with a diagnosis of ulcerative colitis with significant diarrhea that caused dehydration in the past and episodes of DKA. She had to recent admissions for this in 02/26/2016 and 04/14/2016. While in rehabilitation, her CBGs dropped to 30s on her previous dose of insulin, therefore, her long-acting insulin was decreased. This caused her CBGs to stay in the 200s.  She also has a history of lung cancer diagnosed 09/2013. She had chemotherapy and radiation therapy for this, now immunotherapy. She has malignant cachexia.  Last hemoglobin A1c was: Lab Results  Component Value Date   HGBA1C 10.5 05/04/2016   HGBA1C 12.8 (H) 02/11/2016  12/30/2015: 12% 09/18/2015: 11.3% 06/17/2015: 7.5% 04/08/2015: 10.9% 09/10/2014: 8.5%  11/04/2013: 6.2%  Pt was on a regimen of: - Levemir 20 units at bedtime units after supper - Humalog 6 units 3x a day, before meals + sliding scale: 0-69 = hypoglycemic protocol 70 -120 = 0 units  121- 150 = 2 units 151 - 200 = 3 units 201 - 250 = 5 units 251 - 300 = 8 units 301 - 350 = 11 units 351 - 400 = 15 units  > 400 = 15 units, Call MD She was on Metformin >> stopped when starting insulin.  At last visit, we adjusted to:   Pt checks her sugars 1-2 a day and they are: - am: 68, 71-271, 360 >> 87-291 - 2h after b'fast: n/c >> 171-528 - before lunch: n/c >> 113-260 - 2h after lunch: n/c >> 134-439 - before dinner: n/c >> 99-600 - 2h after dinner: n/c >> 128-479 - bedtime: n/c >> 306-594 - nighttime: n/c No lows. Lowest sugar was 30 >> 68; she has hypoglycemia awareness at 50-60.  Highest sugar was 600s at  admission.    Glucometer: ?  Pt's meals are: - Breakfast: Cereal or oatmeal with blueberries or eggs with sausage - Lunch: Sandwich and soup - Dinner: Meat, vegetables, small amount of starch - Snacks: 3-4 a day: Peanuts  - no CKD, last BUN/creatinine:  Lab Results  Component Value Date   BUN 14 04/15/2016   BUN 22 (H) 04/14/2016   CREATININE 0.62 04/15/2016   CREATININE 0.83 04/14/2016   - last set of lipids: 04/04/2013: 141/145/41/71 No results found for: CHOL, HDL, LDLCALC, LDLDIRECT, TRIG, CHOLHDL - last eye exam was in 12/2015 ? DR. Dr. Lady Gary. - + numbness and tingling in her feet. PN from ChTx.  ROS: Constitutional: + weight gain, + fatigue, + hot flushes, + excessive urination Eyes: +  blurry vision, no xerophthalmia ENT: no sore throat, no nodules palpated in throat, no dysphagia/odynophagia, no hoarseness Cardiovascular: no CP/+ SOB/no palpitations/+ leg swelling Respiratory: + cough/+ SOB Gastrointestinal: no N/V/D/C Musculoskeletal: no muscle/+ joint aches Skin: no rashes Neurological: no tremors/numbness/tingling/no dizziness  I reviewed pt's medications, allergies, PMH, social hx, family hx, and changes were documented in the history of present illness. Otherwise, unchanged from my initial visit note.  Past Medical History:  Diagnosis Date  . Cancer ([place]) dx'd 09/2013   Lung ca  . Diabetes mellitus    insulin  . GERD (gastroesophageal reflux disease)    no meds for  .  History of hiatal hernia   . Hx of radiation therapy 12/16/13-01/30/14   lung 66Gy  . Hypertension   . Malignant neoplasm of right upper lobe of lung ([place]) 11/28/2013  . Neuropathy ([place])   . Psoriasis   . Rheumatoid arthritis ([place])   . Shortness of breath dyspnea    with exertion   Past Surgical History:  Procedure Laterality Date  . ABDOMINAL HYSTERECTOMY    . ABDOMINAL SURGERY    . BALLOON DILATION N/A 12/12/2014   Procedure: BALLOON DILATION;  Surgeon: Inda Castle, MD;   Location: [facility];  Service: Endoscopy;  Laterality: N/A;  . CHOLECYSTECTOMY    . COLONOSCOPY N/A 02/07/2016   Procedure: COLONOSCOPY;  Surgeon: Doran Stabler, MD;  Location: [facility];  Service: Endoscopy;  Laterality: N/A;  . DILATION AND CURETTAGE OF UTERUS    . ESOPHAGOGASTRODUODENOSCOPY N/A 12/12/2014   Procedure: ESOPHAGOGASTRODUODENOSCOPY (EGD);  Surgeon: Inda Castle, MD;  Location: [facility];  Service: Endoscopy;  Laterality: N/A;  with dilation  . ESOPHAGOGASTRODUODENOSCOPY (EGD) WITH PROPOFOL N/A 10/09/2014   Procedure: ESOPHAGOGASTRODUODENOSCOPY (EGD) WITH PROPOFOL;  Surgeon: Inda Castle, MD;  Location: [place];  Service: Endoscopy;  Laterality: N/A;  . FRACTURE SURGERY    . ORIF ANKLE FRACTURE Right 07/04/2015   Procedure: OPEN REDUCTION INTERNAL FIXATION (ORIF) ANKLE FRACTURE;  Surgeon: Newt Minion, MD;  Location: [place];  Service: Orthopedics;  Laterality: Right;  OPEN REDUCTION, INTERNAL FIXATION OF RIGHT ANKLE FRACTURE.   [name] ORIF ANKLE FRACTURE Left 02/10/2016   Procedure: OPEN REDUCTION INTERNAL FIXATION (ORIF) ANKLE FRACTURE;  Surgeon: Newt Minion, MD;  Location: [place];  Service: Orthopedics;  Laterality: Left;  . SAVORY DILATION N/A 10/09/2014   Procedure: SAVORY DILATION;  Surgeon: Inda Castle, MD;  Location: [place];  Service: Endoscopy;  Laterality: N/A;  . TONSILLECTOMY     Social History   Social History  . Marital Status: Divorced    Spouse Name: N/A  . Number of Children: 0   Occupational History  . Retired Pharmacist, hospital Other   Social History Main Topics  . Smoking status: Former Smoker -- 3.00 packs/day for 45 years    Types: Cigarettes    Quit date: 12/05/1997  . Smokeless tobacco: Never Used  . Alcohol Use: No  . Drug Use: No   Social History Narrative   Patient lives at home alone.   Caffeine Use: 2 cups daily   Current Outpatient Prescriptions on File Prior to Visit  Medication Sig Dispense Refill  . Budesonide  (UCERIS) 9 MG TB24 Take 9 mg by mouth daily. 30 tablet 3  . fluticasone (CUTIVATE) 0.05 % cream Apply cream to rash/psoriasis on sacrum topically twice daily. Apply 1st before Nizoral cream.    . gabapentin (NEURONTIN) 600 MG tablet Take 600 mg by mouth 2 (two) times daily.    [name] glucagon (GLUCAGON EMERGENCY) 1 MG injection Inject 1 mg into the muscle once as needed. 1 each 12  . HORIZANT 600 MG TBCR TK 2 TS D UTD  6  . HUMALOG KWIKPEN 100 UNIT/ML KiwkPen INJECT 10 UNITS UNDER THE SKIN FOUR TIMES DAILY AS NEEDED FOR SUGAR GREATER THAN 350 AS DIRECTED 15 mL 0  . insulin detemir (LEVEMIR) 100 UNIT/ML injection Inject 20 Units into the skin at bedtime. Inject 15 units SQ at bedtime.    . insulin lispro (HUMALOG) 100 UNIT/ML injection Inject 6-15 Units into the skin 3 (three) times daily with meals.    [name]  Insulin Pen Needle 32G X 5 MM MISC Used to check sugar 4 times daily. 100 each 3  . ketoconazole (NIZORAL) 2 % cream Apply 1 application topically daily. Apply cream to rash/psoriasis to sacrum topically twice a daily. Apply after Cutivate cream.    . ONETOUCH DELICA LANCETS FINE MISC USE TO TEST BLOOD SUGAR FOUR TIMES DAILY AS NEEDED 200 each 11  . solifenacin (VESICARE) 5 MG tablet Take 5 mg by mouth daily.    . Vitamin D, Ergocalciferol, (DRISDOL) 50000 units CAPS capsule Take 1 capsule (50,000 Units total) by mouth every 7 (seven) days. (Patient not taking: Reported on 05/04/2016) 30 capsule    Current Facility-Administered Medications on File Prior to Visit  Medication Dose Route Frequency Provider Last Rate Last Dose  . sodium chloride 0.9 % injection 10 mL  10 mL Intracatheter PRN Curt Bears, MD       Allergies  Allergen Reactions  . Carboplatin Other (See Comments)    Neuropathy  . Remicade [Infliximab] Anaphylaxis  . Hydromorphone Rash   Family History  Problem Relation Age of Onset  . Hypertension Other   . Stroke Other   . Heart attack Other   . Hemachromatosis Other   . Rheum  arthritis      both sets of grandparents and father  . Other Mother   . Other Father     failure to thrive   PE: BP 120/62 (BP Location: Right Arm, Patient Position: Sitting)   Pulse 96   Ht 5' 5.5" (1.664 m)   Wt 152 lb (68.9 kg)   BMI 24.91 kg/m  Wt Readings from Last 3 Encounters:  06/16/16 152 lb (68.9 kg)  05/04/16 141 lb (64 kg)  04/14/16 132 lb 4.4 oz (60 kg)   Constitutional: Thin, in NAD Eyes: PERRLA, EOMI, no exophthalmos ENT: moist mucous membranes, no thyromegaly, no cervical lymphadenopathy Cardiovascular: tachycardia, RR, No MRG, + bilateral. Peri-ankle edema (pitting) Respiratory: CTA B Gastrointestinal: abdomen soft, NT, ND, BS+ Musculoskeletal: no deformities, strength intact in all 4 Skin: moist, warm, + ungual rashes - both fingers and toes Neurological: no tremor with outstretched hands, DTR normal in all 4  ASSESSMENT: 1. DM2, insulin-dependent, uncontrolled, with complications - DKA in the setting of dehydration  Pulmonologist: Dr. Chase Caller Oncologist: Dr. Earlie Server Cardiologist: Dr. Sallyanne Kuster Ophthalmologist: Dr. Clifton James  PLAN:  1. Patient with long-standing, uncontrolled diabetes, on basal-bolus insulin regimen, which became insufficient. She had 2 episodes of DKA in the context of dehydration from uncontrolled diarrhea from colitis. Now better.  - Her sugars have improved since 01/2016 - last HbA1c decreased to 10.5%. - At last visit, we reviewed her insulin regimen and discussed that we will need to stay with insulin for now (although I plan to check her for type I soon), but we adjusted the sliding scale.   - CBG today: 263 >> will only check the pancreatic antibodies today, cannot check the C-peptide - I suggested to:  Patient Instructions  Please continue: - Levemir 20 units at bedtime.  Please increase: - Humalog 15 min before a meal  8 units before a smaller meal  12 units before a larger meal   Please do not skip the insulin if  sugars are 70-100.   If the sugars are <70, take 1/2 of the Humalog dose.  - Humalog Sliding scale: 150-200: + 2 unit 201-250: + 4 units 251-300: + 6 units 301-350: + 8 units >350: + 9 units  Please return in 1.5 months  with your sugar log.   Please let me know if the sugars are consistently <80 or >200.  - Strongly advised her to start checking sugars at different times of the day - check 3 times a day, rotating checks - advised for yearly eye exams >> she is up-to-date - Return to clinic in 1.5 mo with sugar log   Orders Placed This Encounter  Procedures  . Anti-islet cell antibody  . Glutamic acid decarboxylase auto abs   Office Visit on 06/16/2016  Component Date Value Ref Range Status  . Pancreatic Islet Cell Antibody 06/23/2016 <5  <5 JDF Units Final   Comment:    Laboratory Developed Test performed using a reagent labeled by  the manufacturer as ASR Class I or ASR Class II (non-blood bank).    This test was developed and its analytical performance  characteristics have been determined by [facility]. It has not been cleared or approved by the [place] Food and Drug Administration. This assay has been validated  pursuant to the CLIA regulations and is used for clinical  purposes.   . Glutamic Acid Decarb Ab 06/20/2016 24* <5 IU/mL Final   Comment: This test was performed using the GAD65 ELISA method. New method, ELISA, is standardized against the International reference preparation 97/550, is reported in International Units/mL (IU/mL) and a new reference range was implemented.    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 This is managed with insulin, as we are doing now. Sincerely, Philemon Kingdom MD  Philemon Kingdom, MD PhD [facility]  ,

## 2016-06-16 NOTE — Patient Instructions (Addendum)
Please continue: - Levemir 20 units at bedtime.  Please increase: - Humalog 15 min before a meal  8 units before a smaller meal  12 units before a larger meal   Please do not skip the insulin if sugars are 70-100.   If the sugars are <70, take 1/2 of the Humalog dose.  - Humalog Sliding scale: 150-200: + 2 unit 201-250: + 4 units 251-300: + 6 units 301-350: + 8 units >350: + 9 units  Please return in 1.5 months with your sugar log.   Please let me know if the sugars are consistently <80 or >200.

## 2016-06-20 LAB — GLUTAMIC ACID DECARBOXYLASE AUTO ABS: GLUTAMIC ACID DECARB AB: 24 [IU]/mL — AB (ref <5)

## 2016-06-23 LAB — ANTI-ISLET CELL ANTIBODY: Pancreatic Islet Cell Antibody: <5 JDF Units (ref <5)

## 2016-06-28 ENCOUNTER — Other Ambulatory Visit (HOSPITAL_BASED_OUTPATIENT_CLINIC_OR_DEPARTMENT_OTHER): Payer: Medicare Other

## 2016-06-28 ENCOUNTER — Ambulatory Visit (INDEPENDENT_AMBULATORY_CARE_PROVIDER_SITE_OTHER): Payer: Medicare Other | Admitting: Gastroenterology

## 2016-06-28 ENCOUNTER — Encounter: Payer: Self-pay | Admitting: Gastroenterology

## 2016-06-28 VITALS — BP 116/64 | HR 90 | Ht 65.5 in | Wt 154.0 lb

## 2016-06-28 DIAGNOSIS — R1310 Dysphagia, unspecified: Secondary | ICD-10-CM

## 2016-06-28 DIAGNOSIS — K529 Noninfective gastroenteritis and colitis, unspecified: Secondary | ICD-10-CM | POA: Diagnosis not present

## 2016-06-28 DIAGNOSIS — C3411 Malignant neoplasm of upper lobe, right bronchus or lung: Secondary | ICD-10-CM

## 2016-06-28 DIAGNOSIS — T451X5A Adverse effect of antineoplastic and immunosuppressive drugs, initial encounter: Secondary | ICD-10-CM

## 2016-06-28 DIAGNOSIS — K222 Esophageal obstruction: Secondary | ICD-10-CM | POA: Diagnosis not present

## 2016-06-28 DIAGNOSIS — G620 Drug-induced polyneuropathy: Secondary | ICD-10-CM

## 2016-06-28 LAB — CBC WITH DIFFERENTIAL/PLATELET
BASO%: 0.6 % (ref 0.0–2.0)
BASOS ABS: 0.1 10*3/uL (ref 0.0–0.1)
EOS ABS: 0.5 10*3/uL (ref 0.0–0.5)
EOS%: 5.5 % (ref 0.0–7.0)
HCT: 41.6 % (ref 34.8–46.6)
HEMOGLOBIN: 13.5 g/dL (ref 11.6–15.9)
LYMPH%: 10.8 % — AB (ref 14.0–49.7)
MCH: 28.4 pg (ref 25.1–34.0)
MCHC: 32.5 g/dL (ref 31.5–36.0)
MCV: 87.4 fL (ref 79.5–101.0)
MONO#: 0.5 10*3/uL (ref 0.1–0.9)
MONO%: 5.4 % (ref 0.0–14.0)
NEUT%: 77.7 % — ABNORMAL HIGH (ref 38.4–76.8)
NEUTROS ABS: 7.6 10*3/uL — AB (ref 1.5–6.5)
PLATELETS: 195 10*3/uL (ref 145–400)
RBC: 4.76 10*6/uL (ref 3.70–5.45)
RDW: 16.8 % — AB (ref 11.2–14.5)
WBC: 9.8 10*3/uL (ref 3.9–10.3)
lymph#: 1.1 10*3/uL (ref 0.9–3.3)

## 2016-06-28 LAB — COMPREHENSIVE METABOLIC PANEL
ALBUMIN: 3.4 g/dL — AB (ref 3.5–5.0)
ALK PHOS: 149 U/L (ref 40–150)
ALT: 13 U/L (ref 0–55)
ANION GAP: 15 meq/L — AB (ref 3–11)
AST: 14 U/L (ref 5–34)
BILIRUBIN TOTAL: 0.89 mg/dL (ref 0.20–1.20)
BUN: 18.1 mg/dL (ref 7.0–26.0)
CO2: 21 mEq/L — ABNORMAL LOW (ref 22–29)
Calcium: 9.3 mg/dL (ref 8.4–10.4)
Chloride: 102 mEq/L (ref 98–109)
Creatinine: 1 mg/dL (ref 0.6–1.1)
EGFR: 57 mL/min/{1.73_m2} — AB (ref >90)
Glucose: 347 mg/dl — ABNORMAL HIGH (ref 70–140)
Potassium: 3.8 mEq/L (ref 3.5–5.1)
Sodium: 138 mEq/L (ref 136–145)
TOTAL PROTEIN: 6.6 g/dL (ref 6.4–8.3)

## 2016-06-28 NOTE — Patient Instructions (Addendum)
If you are age 74 or older, your body mass index should be between 23-30. Your Body mass index is 25.24 kg/m. If this is out of the aforementioned range listed, please consider follow up with your Primary Care Provider.  If you are age 18 or younger, your body mass index should be between 19-25. Your Body mass index is 25.24 kg/m. If this is out of the aformentioned range listed, please consider follow up with your Primary Care Provider.   You have been scheduled for an endoscopy. Please follow written instructions given to you at your visit today. If you use inhalers (even only as needed), please bring them with you on the day of your procedure.  Thank you for choosing [facility]  Dr Wilfrid Lund III

## 2016-06-28 NOTE — Progress Notes (Signed)
[facility] Progress Note  Chief Complaint: Ulcerative colitis and dysphagia  Subjective  History:  Please see my 03/22/2016 office note for details. The patient appeared to have ulcerative pancolitis on a colonoscopy while hospitalized several months ago. She did not seem to improve on Asacol HD, and felt she would not be able to swallow Lialda, and decided she did not want to try Uceris.  However, Mykenna indicates that her diarrhea completely resolved. She still feels this was also now secondary to the immunotherapy she was taking for lung cancer. She has had slow worsening of dysphagia, for which she previously saw Dr. Deatra Ina. An upper endoscopy in late 2015 and again in early 2016 revealed a mid to distal radiation-induced esophageal stricture. Dr. Deatra Ina performed a wire-guided dilation, and Huong indicates that he gave her several months of relief from dysphagia. Yesterday she felt that a boiled egg became stuck, and sometimes even seems like water will get hung up. Despite that, her appetite is good and her weight stable. She is also following up with her oncologist to see the status of her lung cancer, apparently having recently had a CT scan.  ROS: Cardiovascular:  no chest pain Respiratory: Chronic dyspnea with minimal exertion  The patient's Past Medical, Family and Social History were reviewed and are on file in the EMR.  Objective:  Med list reviewed  Vital signs in last 24 hrs: Vitals:   06/28/16 1600  BP: 116/64  Pulse: 90    Physical Exam  Chronically ill-appearing woman, mildly dyspneic at rest  HEENT: sclera anicteric, oral mucosa moist without lesions  Neck: supple, no thyromegaly, JVD or lymphadenopathy  Cardiac: RRR without murmurs, S1S2 heard, no peripheral edema  Pulm: clear to auscultation bilaterally, normal RR and effort noted  Abdomen: soft, no tenderness, with active bowel sounds. No guarding or palpable hepatosplenomegaly.  Skin; warm and  dry, no jaundice or rash  No recent labs or imaging are available for review  '@ASSESSMENTPLANBEGIN'$ @ Assessment: Encounter Diagnoses  Name Primary?  . Acute colitis Yes  . Dysphagia   . Radiation-induced esophageal stricture     The nature of her colitis is unclear. It certainly looked chronic on biopsies, but currently does not seem to be causing symptoms. She would like to just take as needed Imodium rather than any colitis specific medication.  Regarding her dysphagia and esophageal stricture, it is unclear, which benefit she had from esophageal dilation in the past. Nevertheless, she says the problem is becoming slowly worse and she would like to see if another dilation is feasible.  Plan: Have scheduled EGD with wire-guided dilation under x-ray guidance in outpatient hospital endoscopy lab. I have asked her to call me between now and then if there is any significant change in her pulmonary condition or malignancy which might change our plans.  Total time 30 minutes, over half spent in counseling and coordination of care.   Nelida Meuse III

## 2016-06-29 ENCOUNTER — Ambulatory Visit (HOSPITAL_COMMUNITY)
Admission: RE | Admit: 2016-06-29 | Discharge: 2016-06-29 | Disposition: A | Discharge status: Home / Self Care | Payer: Medicare Other | Source: Ambulatory Visit | Attending: Internal Medicine | Admitting: Internal Medicine

## 2016-06-29 ENCOUNTER — Encounter (HOSPITAL_COMMUNITY): Payer: Self-pay

## 2016-06-29 DIAGNOSIS — I700 Atherosclerosis of aorta: Secondary | ICD-10-CM | POA: Diagnosis not present

## 2016-06-29 DIAGNOSIS — C3411 Malignant neoplasm of upper lobe, right bronchus or lung: Secondary | ICD-10-CM | POA: Diagnosis present

## 2016-06-29 DIAGNOSIS — J90 Pleural effusion, not elsewhere classified: Secondary | ICD-10-CM | POA: Insufficient documentation

## 2016-06-29 DIAGNOSIS — T451X5A Adverse effect of antineoplastic and immunosuppressive drugs, initial encounter: Secondary | ICD-10-CM | POA: Insufficient documentation

## 2016-06-29 DIAGNOSIS — K7689 Other specified diseases of liver: Secondary | ICD-10-CM | POA: Diagnosis not present

## 2016-06-29 DIAGNOSIS — G620 Drug-induced polyneuropathy: Secondary | ICD-10-CM | POA: Diagnosis not present

## 2016-06-29 DIAGNOSIS — I2510 Atherosclerotic heart disease of native coronary artery without angina pectoris: Secondary | ICD-10-CM | POA: Insufficient documentation

## 2016-06-29 DIAGNOSIS — J984 Other disorders of lung: Secondary | ICD-10-CM | POA: Insufficient documentation

## 2016-06-29 DIAGNOSIS — J9811 Atelectasis: Secondary | ICD-10-CM | POA: Insufficient documentation

## 2016-06-29 MED ORDER — IOPAMIDOL (ISOVUE-300) INJECTION 61%
75.0000 mL | Freq: Once | INTRAVENOUS | Status: AC | PRN
  Administered 2016-06-29: 75 mL via INTRAVENOUS

## 2016-07-01 ENCOUNTER — Other Ambulatory Visit: Payer: Self-pay

## 2016-07-01 ENCOUNTER — Telehealth: Payer: Self-pay | Admitting: Internal Medicine

## 2016-07-01 MED ORDER — INSULIN PEN NEEDLE 32G X 5 MM MISC: 3 refills | Status: DC

## 2016-07-01 NOTE — Telephone Encounter (Signed)
Pt needs a script for the BD Pen Needles for 5x daily  [facility] [address]

## 2016-07-05 ENCOUNTER — Encounter: Payer: Self-pay | Admitting: Internal Medicine

## 2016-07-05 ENCOUNTER — Ambulatory Visit (HOSPITAL_BASED_OUTPATIENT_CLINIC_OR_DEPARTMENT_OTHER): Payer: Medicare Other | Admitting: Internal Medicine

## 2016-07-05 ENCOUNTER — Telehealth: Payer: Self-pay | Admitting: Internal Medicine

## 2016-07-05 VITALS — BP 136/89 | HR 110 | Temp 98.5°F | Resp 18 | Ht 65.5 in | Wt 156.7 lb

## 2016-07-05 DIAGNOSIS — C3411 Malignant neoplasm of upper lobe, right bronchus or lung: Secondary | ICD-10-CM

## 2016-07-05 NOTE — Progress Notes (Signed)
[facility] [phone]   [phone]  OFFICE PROGRESS NOTE  Binnie Rail, MD [place] [address]  DIAGNOSIS: Squamous cell lung cancer  Primary site: Lung (Right)  Staging method: AJCC 7th Edition  Clinical: Stage IIIB (T3, N3, M0)  Summary: Stage IIIB (T3, N3, M0)   PRIOR THERAPY:  1) Concurrent chemoradiation with weekly chemotherapy in the form of carboplatin for AUC of 2 and paclitaxel 45 mg/m2. Status post 7 week of treatment. Last dose was given 01/27/2014 no significant response in her disease.  2) Consolidation chemotherapy with carboplatin for AUC of 5 and paclitaxel 175 mg/M2 every 3 weeks with Neulasta support. First dose on 04/15/2014. Status post 3 cycles. Carboplatin was discontinued starting cycle #2 secondary to hypersensitivity reaction. 3) Immunotherapy with Opdivo (Nivolumab) '3mg'$ /kg given every 2 weeks. Status post 33 cycles discontinued secondary to intolerance.  CURRENT THERAPY: Observation.  CHEMOTHERAPY INTENT: Control/Palliative  CURRENT # OF CHEMOTHERAPY CYCLES: 0 CURRENT ANTIEMETICS: Zofran, dexamethasone, Compazine  CURRENT SMOKING STATUS: Former smoker, quit 12/05/1997  ORAL CHEMOTHERAPY AND CONSENT: n/a  CURRENT BISPHOSPHONATES USE: None  PAIN MANAGEMENT: no cancer related pain, patient does have RA  NARCOTICS INDUCED CONSTIPATION: none  LIVING WILL AND CODE STATUS: ?  INTERVAL HISTORY: BERKELEY VELDMAN 75 y.o. female returns to the clinic today for follow-up visit. The patient is feeling fine today with no specific complaints. The patient denied having any chest pain, shortness of breath, cough or hemoptysis. She has no significant nausea or vomiting, no fever or chills. No significant weight loss or night sweats. She had repeat CT scan of the chest performed recently and she is here for evaluation and discussion of her scan results.  MEDICAL HISTORY: Past Medical History:    Diagnosis Date  . Cancer ([facility]) dx'd 09/2013   Lung ca  . Diabetes mellitus    insulin  . GERD (gastroesophageal reflux disease)    no meds for  . History of hiatal hernia   . Hx of radiation therapy 12/16/13-01/30/14   lung 66Gy  . Hypertension   . Malignant neoplasm of right upper lobe of lung ([place]) 11/28/2013  . Neuropathy ([place])   . Psoriasis   . Rheumatoid arthritis ([facility])   . Shortness of breath dyspnea    with exertion    ALLERGIES:  is allergic to carboplatin; remicade [infliximab]; and hydromorphone.  MEDICATIONS:  Current Outpatient Prescriptions  Medication Sig Dispense Refill  . DULoxetine (CYMBALTA) 60 MG capsule     . fluticasone (CUTIVATE) 0.05 % cream Apply cream to rash/psoriasis on sacrum topically twice daily. Apply 1st before Nizoral cream.    . gabapentin (NEURONTIN) 600 MG tablet Take 600 mg by mouth 2 (two) times daily.    [name] HORIZANT 600 MG TBCR TK 2 TS D UTD  6  . insulin detemir (LEVEMIR) 100 UNIT/ML injection Inject 20 Units into the skin at bedtime. Inject 15 units SQ at bedtime.    . insulin lispro (HUMALOG KWIKPEN) 100 UNIT/ML KiwkPen INJECT 8-20 units 3x a day under skin 30 mL 5  . Insulin Pen Needle 32G X 5 MM MISC Used to check sugar 5 times daily. 100 each 3  . ketoconazole (NIZORAL) 2 % cream Apply 1 application topically daily. Apply cream to rash/psoriasis to sacrum topically twice a daily. Apply after Cutivate cream.    . ONETOUCH DELICA LANCETS FINE MISC USE TO TEST BLOOD SUGAR FOUR TIMES DAILY AS NEEDED 200 each  11  . ONETOUCH VERIO test strip Use 4x a day 200 each 11  . solifenacin (VESICARE) 5 MG tablet Take 5 mg by mouth daily.     No current facility-administered medications for this visit.    Facility-Administered Medications Ordered in Other Visits  Medication Dose Route Frequency Provider Last Rate Last Dose  . sodium chloride 0.9 % injection 10 mL  10 mL Intracatheter PRN Curt Bears, MD        SURGICAL HISTORY:  Past Surgical  History:  Procedure Laterality Date  . ABDOMINAL HYSTERECTOMY    . ABDOMINAL SURGERY    . BALLOON DILATION N/A 12/12/2014   Procedure: BALLOON DILATION;  Surgeon: Inda Castle, MD;  Location: [facility];  Service: Endoscopy;  Laterality: N/A;  . CHOLECYSTECTOMY    . COLONOSCOPY N/A 02/07/2016   Procedure: COLONOSCOPY;  Surgeon: Doran Stabler, MD;  Location: [facility];  Service: Endoscopy;  Laterality: N/A;  . DILATION AND CURETTAGE OF UTERUS    . ESOPHAGOGASTRODUODENOSCOPY N/A 12/12/2014   Procedure: ESOPHAGOGASTRODUODENOSCOPY (EGD);  Surgeon: Inda Castle, MD;  Location: [facility];  Service: Endoscopy;  Laterality: N/A;  with dilation  . ESOPHAGOGASTRODUODENOSCOPY (EGD) WITH PROPOFOL N/A 10/09/2014   Procedure: ESOPHAGOGASTRODUODENOSCOPY (EGD) WITH PROPOFOL;  Surgeon: Inda Castle, MD;  Location: [facility];  Service: Endoscopy;  Laterality: N/A;  . FRACTURE SURGERY    . ORIF ANKLE FRACTURE Right 07/04/2015   Procedure: OPEN REDUCTION INTERNAL FIXATION (ORIF) ANKLE FRACTURE;  Surgeon: Newt Minion, MD;  Location: [facility];  Service: Orthopedics;  Laterality: Right;  OPEN REDUCTION, INTERNAL FIXATION OF RIGHT ANKLE FRACTURE.   [name] ORIF ANKLE FRACTURE Left 02/10/2016   Procedure: OPEN REDUCTION INTERNAL FIXATION (ORIF) ANKLE FRACTURE;  Surgeon: Newt Minion, MD;  Location: [facility];  Service: Orthopedics;  Laterality: Left;  . SAVORY DILATION N/A 10/09/2014   Procedure: SAVORY DILATION;  Surgeon: Inda Castle, MD;  Location: [facility];  Service: Endoscopy;  Laterality: N/A;  . TONSILLECTOMY      REVIEW OF SYSTEMS:  A comprehensive review of systems was negative except for: Constitutional: positive for fatigue   PHYSICAL EXAMINATION: General appearance: alert, cooperative, fatigued and no distress Head: Normocephalic, without obvious abnormality, atraumatic Neck: no adenopathy, no JVD, supple, symmetrical, trachea midline and thyroid not enlarged, symmetric, no  tenderness/mass/nodules Lymph nodes: Cervical, supraclavicular, and axillary nodes normal. Resp: clear to auscultation bilaterally Back: symmetric, no curvature. ROM normal. No CVA tenderness. Cardio: regular rate and rhythm, S1, S2 normal, no murmur, click, rub or gallop GI: soft, non-tender; bowel sounds normal; no masses,  no organomegaly Extremities: extremities normal, atraumatic, no cyanosis or edema Neurologic: Alert and oriented X 3, normal strength and tone. Normal symmetric reflexes. Normal coordination and gait  ECOG PERFORMANCE STATUS: 1 - Symptomatic but completely ambulatory  Blood pressure 136/89, pulse (!) 110, temperature 98.5 F (36.9 C), temperature source Oral, resp. rate 18, height 5' 5.5" (1.664 m), weight 156 lb 11.2 oz (71.1 kg), SpO2 98 %.  LABORATORY DATA: Lab Results  Component Value Date   WBC 9.8 06/28/2016   HGB 13.5 06/28/2016   HCT 41.6 06/28/2016   MCV 87.4 06/28/2016   PLT 195 06/28/2016      Chemistry      Component Value Date/Time   NA 138 06/28/2016 1058   K 3.8 06/28/2016 1058   CL 110 04/15/2016 0454   CO2 21 (L) 06/28/2016 1058   BUN 18.1 06/28/2016 1058   CREATININE 1.0 06/28/2016 1058  GLU 176 03/07/2016      Component Value Date/Time   CALCIUM 9.3 06/28/2016 1058   ALKPHOS 149 06/28/2016 1058   AST 14 06/28/2016 1058   ALT 13 06/28/2016 1058   BILITOT 0.89 06/28/2016 1058       RADIOGRAPHIC STUDIES: Ct Chest W Contrast  Result Date: 06/29/2016 CLINICAL DATA:  Right upper lobe stage IIIB squamous cell lung cancer diagnosed in 2014, chemotherapy completed, radiation therapy completed in 2015. Chronic cough. EXAM: CT CHEST WITH CONTRAST TECHNIQUE: Multidetector CT imaging of the chest was performed during intravenous contrast administration. CONTRAST:  91m ISOVUE-300 IOPAMIDOL (ISOVUE-300) INJECTION 61% COMPARISON:  03/14/2016 FINDINGS: Cardiovascular: Atherosclerotic calcification of the aortic arch and in the left anterior  descending coronary artery. Mediastinum/Nodes: Wall thickening in the mid thoracic esophagus with ill definition of surrounding wall margins, similar to prior. Left infrahilar node 0.9 cm in short axis, unchanged. Lungs/Pleura: Stable appearance of chronically occluded left upper lobe bronchus and chronic occlusion of the right middle lobe bronchus. There is occlusion of some of the segmental and subsegmental bronchi in the right lower lobe. Result is collapse of the right upper lobe and right middle lobe along with some chronic scarring/ atelectasis in the right lower lobe in a pattern unchanged from previous. No increase in the associated soft tissue density to suggest malignancy. Much of these findings are probably treatment related. Small right pleural effusion, likely with some mild loculations superiorly. Chronic attenuation of pulmonary arterial and pulmonary venous segments from the atelectatic part of the right lung. Calcified granuloma, left lower lobe, image 97/5, with some adjacent chronic minimal noncalcified nodularity. The prior mosaic attenuation in the lungs has resolved. Upper Abdomen: 1.1 by 0.7 cm hypodense lesion in segment 4A of the liver, no change from 02/28/2014, considered benign. Similar cystic lesion in the lateral segment left hepatic lobe 2.0 by 1.3 cm on image 142/2, previously 1.8 by 1.4 cm back on 02/28/2014. Cholecystectomy. Old granulomatous disease of the spleen. Musculoskeletal: Degenerative glenohumeral arthropathy, right greater the left. Lower thoracic kyphosis with thoracic spondylosis. No findings of osseous metastatic disease. IMPRESSION: 1. Stable appearance of the chest with chronic bronchial occlusion in the right upper lobe, right middle lobe, and some segments of the right lower lobe, resulting in chronic atelectasis with some associated scarring. Much of these findings are likely treatment related. No compelling findings of active recurrent malignancy. 2. Old  granulomatous disease. 3. Coronary and aortic arch atherosclerotic calcification. 4. Benign-appearing hepatic cysts. 5. Small right pleural effusion with some mild loculations superiorly, stable. Electronically Signed   By: WVan ClinesM.D.   On: 06/29/2016 09:18    ASSESSMENT AND PLAN: This is a very pleasant 75years old white female with metastatic non-small cell lung cancer, squamous cell carcinoma who is currently undergoing treatment with immunotherapy with Nivolumab status post 33 cycles and tolerating her treatment fairly well except for recently several episodes of diarrhea concerning to be immune mediated from her treatment. This completely resolved after the patient was treated with prednisone that has been tapered. The patient is currently on observation and she is feeling fine.  The recent CT scan of the chest showed no evidence for disease progression. I discussed the scan results with the patient today. I recommended for her to continue on observation with repeat CT scan of the chest in 4 months for restaging of her disease. The patient was advised to call immediately if she has any concerning symptoms in the interval. The patient voices understanding  of current disease status and treatment options and is in agreement with the current care plan.  All questions were answered. The patient knows to call the clinic with any problems, questions or concerns. We can certainly see the patient much sooner if necessary.  Disclaimer: This note was dictated with voice recognition software. Similar sounding words can inadvertently be transcribed and may not be corrected upon review.

## 2016-07-05 NOTE — Telephone Encounter (Signed)
AVS REPORT & SCHD GIVEN PER 07/05/16 LOS.

## 2016-07-05 NOTE — Telephone Encounter (Signed)
AVS REPORT AND SCHD GIVEN PER 07/05/16 LOS.

## 2016-07-25 ENCOUNTER — Telehealth: Payer: Self-pay | Admitting: Internal Medicine

## 2016-07-25 ENCOUNTER — Other Ambulatory Visit: Payer: Self-pay

## 2016-07-25 MED ORDER — INSULIN DETEMIR 100 UNIT/ML ~~LOC~~ SOLN: 20.0000 [IU] | Freq: Every day | SUBCUTANEOUS | 1 refills | Status: DC

## 2016-07-25 NOTE — Telephone Encounter (Signed)
levemir flex touch needs to be called into [facility]

## 2016-07-25 NOTE — Telephone Encounter (Signed)
Pt also needs the BD ultra fine pen needles called in please 2 boxes in the rx Pt also needs one touch verio test strips 5 times a day Pt also needs lancets for her verio meter

## 2016-07-27 ENCOUNTER — Other Ambulatory Visit: Payer: Self-pay

## 2016-07-27 MED ORDER — INSULIN PEN NEEDLE 32G X 5 MM MISC: 3 refills | Status: DC

## 2016-07-27 MED ORDER — ONETOUCH DELICA LANCETS FINE MISC: 11 refills | Status: DC

## 2016-07-27 MED ORDER — INSULIN DETEMIR 100 UNIT/ML ~~LOC~~ SOLN: 20.0000 [IU] | Freq: Every day | SUBCUTANEOUS | 1 refills | Status: DC

## 2016-07-27 MED ORDER — INSULIN DETEMIR 100 UNIT/ML FLEXPEN: PEN_INJECTOR | SUBCUTANEOUS | 2 refills | Status: DC

## 2016-07-27 MED ORDER — ONETOUCH VERIO VI STRP: ORAL_STRIP | 11 refills | Status: DC

## 2016-07-27 NOTE — Telephone Encounter (Signed)
[facility] calling for verbal on flex touch levemir  Pt is requesting the pens not the vial please and thank you

## 2016-07-27 NOTE — Telephone Encounter (Signed)
Levemir, needles, strips, and lancet sent to pharmacy.

## 2016-08-02 ENCOUNTER — Ambulatory Visit (INDEPENDENT_AMBULATORY_CARE_PROVIDER_SITE_OTHER): Payer: Medicare Other | Admitting: Orthopedic Surgery

## 2016-08-02 DIAGNOSIS — I87321 Chronic venous hypertension (idiopathic) with inflammation of right lower extremity: Secondary | ICD-10-CM

## 2016-08-02 DIAGNOSIS — M2011 Hallux valgus (acquired), right foot: Secondary | ICD-10-CM

## 2016-08-02 DIAGNOSIS — M19071 Primary osteoarthritis, right ankle and foot: Secondary | ICD-10-CM

## 2016-08-02 DIAGNOSIS — M1712 Unilateral primary osteoarthritis, left knee: Secondary | ICD-10-CM

## 2016-08-09 ENCOUNTER — Ambulatory Visit (INDEPENDENT_AMBULATORY_CARE_PROVIDER_SITE_OTHER): Payer: Medicare Other | Admitting: Orthopedic Surgery

## 2016-08-09 DIAGNOSIS — M2011 Hallux valgus (acquired), right foot: Secondary | ICD-10-CM

## 2016-08-12 ENCOUNTER — Encounter: Payer: Self-pay | Admitting: Internal Medicine

## 2016-08-12 ENCOUNTER — Ambulatory Visit (INDEPENDENT_AMBULATORY_CARE_PROVIDER_SITE_OTHER): Payer: Medicare Other | Admitting: Internal Medicine

## 2016-08-12 VITALS — BP 134/94 | HR 88 | Temp 97.7°F | Resp 18 | Wt 166.0 lb

## 2016-08-12 VITALS — BP 118/76 | HR 102 | Ht 65.5 in | Wt 164.0 lb

## 2016-08-12 DIAGNOSIS — Z23 Encounter for immunization: Secondary | ICD-10-CM | POA: Diagnosis not present

## 2016-08-12 DIAGNOSIS — G620 Drug-induced polyneuropathy: Secondary | ICD-10-CM | POA: Diagnosis not present

## 2016-08-12 DIAGNOSIS — M069 Rheumatoid arthritis, unspecified: Secondary | ICD-10-CM | POA: Diagnosis not present

## 2016-08-12 DIAGNOSIS — E139 Other specified diabetes mellitus without complications: Secondary | ICD-10-CM

## 2016-08-12 DIAGNOSIS — E109 Type 1 diabetes mellitus without complications: Secondary | ICD-10-CM

## 2016-08-12 DIAGNOSIS — C342 Malignant neoplasm of middle lobe, bronchus or lung: Secondary | ICD-10-CM

## 2016-08-12 DIAGNOSIS — T451X5A Adverse effect of antineoplastic and immunosuppressive drugs, initial encounter: Secondary | ICD-10-CM

## 2016-08-12 LAB — POCT GLYCOSYLATED HEMOGLOBIN (HGB A1C): Hemoglobin A1C: 8.3

## 2016-08-12 NOTE — Patient Instructions (Addendum)
Please continue: - Levemir 20 units at bedtime.  Please change the Humalog dose: - insulin to carb ratio 1:7. If you still see high sugars after meals, then decrease the ICR to 1:6  Please do not skip the insulin if sugars are 70-100.   If the sugars are <70, take 1/2 of the Humalog dose.  Continue: - Humalog Sliding scale: 150-200: + 2 unit 201-250: + 4 units 251-300: + 6 units 301-350: + 8 units >350: + 9 units  Please schedule an appt with Antonieta Iba with nutrition.  Please return in 3 months with your sugar log.   Please let me know if the sugars are consistently <80 or >200.

## 2016-08-12 NOTE — Progress Notes (Signed)
Subjective:    Patient ID: Megan [name], female    DOB: [birth year], 75 y.o.   MRN: [id number]  HPI She is here to establish with a new pcp.    Lung cancer on right; metastatic non-small cell cancer:  She has had chemotherapy and immunotherapy.  She is currently just being observed.  She is following with oncology.    Hypertension: She did have syncope in the past and her BP medication was discontinued.  She has been good since then -  No syncope and her BP has been controlled.  She is compliant with a low sodium diet.    Diabetes: She is following with Dr Cruzita Lederer.  She is taking her medication daily as prescribed. She is not compliant with a diabetic diet.  Her sugars have become better controlled.    Neuropathy:  She has numbness/tingling and occasionally burning in her feet and left hand.  This is secondary to the chemotherapy, carboplatin.  She takes gabapentin and cymbalta.    Medications and allergies reviewed with patient and updated if appropriate.  Patient Active Problem List   Diagnosis Date Noted  . LADA (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), managed as type 1 ([place]) 05/04/2016  . Syncope 03/25/2016  . Malnutrition of moderate degree 02/29/2016  . Skin breakdown 02/26/2016  . Hyponatremia 02/26/2016  . Lactic acidosis 02/26/2016  . Pressure ulcer 02/26/2016  . Leukocytosis 02/17/2016  . Diabetic ketoacidosis with coma associated with diabetes mellitus due to underlying condition ([place])   . Abdominal pain   . Abnormal finding on GI tract imaging   . Diarrhea   . Hematochezia   . Acute kidney injury ([place]) 02/04/2016  . Acute colitis 02/04/2016  . RLS (restless legs syndrome) 12/11/2015  . Diarrhea due to drug 08/11/2015  . Liver dysfunction 08/11/2015  . Lumbago 05/06/2015  . Hot flashes 04/14/2015  . Peripheral edema 04/14/2015  . Hypokalemia 04/14/2015  . Rheumatoid arthritis ([place]) 04/14/2015  . Encounter for antineoplastic immunotherapy 04/06/2015  .  Emphysema of lung ([place]) 11/26/2014  . Radiation-induced esophageal stricture 10/01/2014  . Tachycardia 08/27/2014  . Depression 08/27/2014  . Palliative care encounter 08/21/2014  . DNR (do not resuscitate) discussion 08/21/2014  . Neuropathic pain 08/21/2014  . Cancer associated pain 07/29/2014  . Malignant cachexia ([place]) 07/29/2014  . Lung collapse 07/29/2014  . Chemotherapy-induced neuropathy ([place]) 07/29/2014  . Fatigue 07/18/2014  . Neuropathy due to chemotherapeutic drug ([place]) 07/18/2014  . Fever 07/18/2014  . Dehydration 07/18/2014  . Leg cramps 07/18/2014  . Hypoalbuminemia 07/18/2014  . Thrombocytopenia, unspecified 07/18/2014  . Dyspnea 04/28/2014  . Radiation esophagitis 04/10/2014  . Right-sided chest wall pain 04/01/2014  . Cancer of middle lobe of lung ([place]) 01/17/2014  . Malignant neoplasm of right upper lobe of lung ([place]) 11/28/2013  . Lung mass 11/09/2013  . Need for prophylactic vaccination and inoculation against influenza 11/09/2013    Current Outpatient Prescriptions on File Prior to Visit  Medication Sig Dispense Refill  . DULoxetine (CYMBALTA) 60 MG capsule     . fluticasone (CUTIVATE) 0.05 % cream Apply cream to rash/psoriasis on sacrum topically twice daily. Apply 1st before Nizoral cream.    . gabapentin (NEURONTIN) 600 MG tablet Take 600 mg by mouth 2 (two) times daily.    [name] HORIZANT 600 MG TBCR TK 2 TS D UTD  6  . Insulin Detemir (LEVEMIR FLEXPEN) 100 UNIT/ML Pen Inject 20 units into the skin at bedtime. 10 pen 2  . insulin  lispro (HUMALOG KWIKPEN) 100 UNIT/ML KiwkPen INJECT 8-20 units 3x a day under skin (Patient taking differently: INJECT 8-20 units 4x a day under skin) 30 mL 5  . Insulin Pen Needle 32G X 5 MM MISC Used to check sugar 5 times daily. 100 each 3  . ketoconazole (NIZORAL) 2 % cream Apply 1 application topically daily. Apply cream to rash/psoriasis to sacrum topically twice a daily. Apply after Cutivate cream.    . ONETOUCH DELICA LANCETS  FINE MISC USE TO TEST BLOOD SUGAR FIVE TIMES DAILY AS NEEDED 200 each 11  . ONETOUCH VERIO test strip Use 4x a day 200 each 11  . solifenacin (VESICARE) 5 MG tablet Take 5 mg by mouth daily.     Current Facility-Administered Medications on File Prior to Visit  Medication Dose Route Frequency Provider Last Rate Last Dose  . sodium chloride 0.9 % injection 10 mL  10 mL Intracatheter PRN Curt Bears, MD        Past Medical History:  Diagnosis Date  . Cancer ([place]) dx'd 09/2013   Lung ca  . Diabetes mellitus    insulin  . GERD (gastroesophageal reflux disease)    no meds for  . History of hiatal hernia   . Hx of radiation therapy 12/16/13-01/30/14   lung 66Gy  . Hypertension   . Malignant neoplasm of right upper lobe of lung ([place]) 11/28/2013  . Neuropathy ([place])   . Psoriasis   . Rheumatoid arthritis ([place])   . Shortness of breath dyspnea    with exertion    Past Surgical History:  Procedure Laterality Date  . ABDOMINAL HYSTERECTOMY    . ABDOMINAL SURGERY    . BALLOON DILATION N/A 12/12/2014   Procedure: BALLOON DILATION;  Surgeon: Inda Castle, MD;  Location: [facility];  Service: Endoscopy;  Laterality: N/A;  . CHOLECYSTECTOMY    . COLONOSCOPY N/A 02/07/2016   Procedure: COLONOSCOPY;  Surgeon: Doran Stabler, MD;  Location: [facility] ENDOSCOPY;  Service: Endoscopy;  Laterality: N/A;  . DILATION AND CURETTAGE OF UTERUS    . ESOPHAGOGASTRODUODENOSCOPY N/A 12/12/2014   Procedure: ESOPHAGOGASTRODUODENOSCOPY (EGD);  Surgeon: Inda Castle, MD;  Location: [facility];  Service: Endoscopy;  Laterality: N/A;  with dilation  . ESOPHAGOGASTRODUODENOSCOPY (EGD) WITH PROPOFOL N/A 10/09/2014   Procedure: ESOPHAGOGASTRODUODENOSCOPY (EGD) WITH PROPOFOL;  Surgeon: Inda Castle, MD;  Location: [place];  Service: Endoscopy;  Laterality: N/A;  . FRACTURE SURGERY    . ORIF ANKLE FRACTURE Right 07/04/2015   Procedure: OPEN REDUCTION INTERNAL FIXATION (ORIF) ANKLE FRACTURE;  Surgeon: Newt Minion, MD;  Location: [place];  Service: Orthopedics;  Laterality: Right;  OPEN REDUCTION, INTERNAL FIXATION OF RIGHT ANKLE FRACTURE.   [name] ORIF ANKLE FRACTURE Left 02/10/2016   Procedure: OPEN REDUCTION INTERNAL FIXATION (ORIF) ANKLE FRACTURE;  Surgeon: Newt Minion, MD;  Location: [place];  Service: Orthopedics;  Laterality: Left;  . SAVORY DILATION N/A 10/09/2014   Procedure: SAVORY DILATION;  Surgeon: Inda Castle, MD;  Location: [place];  Service: Endoscopy;  Laterality: N/A;  . TONSILLECTOMY      Social History   Social History  . Marital status: Divorced    Spouse name: N/A  . Number of children: 0  . Years of education: MA   Occupational History  . Retired Other   Social History Main Topics  . Smoking status: Former Smoker    Packs/day: 3.00    Years: 45.00    Types: Cigarettes    Quit  date: 12/05/1997  . Smokeless tobacco: Never Used  . Alcohol use No  . Drug use: No  . Sexual activity: Not Currently   Other Topics Concern  . Not on file   Social History Narrative   Patient lives at home alone.   Caffeine Use: 2 cups daily    Family History  Problem Relation Age of Onset  . Other Mother   . Other Father     failure to thrive  . Hypertension Other   . Stroke Other   . Heart attack Other   . Hemachromatosis Other   . Rheum arthritis      both sets of grandparents and father    Review of Systems  Constitutional: Negative for appetite change, chills and fever.       Hot flashes  Eyes: Positive for visual disturbance (blurry vision at times).  Respiratory: Positive for cough, shortness of breath (minimal, very inactive) and wheezing.   Cardiovascular: Positive for leg swelling. Negative for chest pain and palpitations.  Gastrointestinal: Negative for abdominal pain, blood in stool, constipation and diarrhea.       No gerd  Genitourinary: Negative for dysuria and hematuria.  Neurological: Negative for light-headedness and headaches.    Psychiatric/Behavioral: Negative for dysphoric mood. The patient is not nervous/anxious.        Objective:   Vitals:   08/12/16 1511  BP: (!) 134/94  Pulse: 88  Resp: 18  Temp: 97.7 F (36.5 C)   Filed Weights   08/12/16 1511  Weight: 166 lb (75.3 kg)   Body mass index is 27.2 kg/m.   Physical Exam Constitutional: She appears well-developed and well-nourished. No distress.  HENT:  Head: Normocephalic and atraumatic.  Right Ear: External ear normal.  Left Ear: External ear normal.    Mouth/Throat: Oropharynx is clear and moist.  Eyes: Conjunctivae normal.  Neck: Neck supple. No tracheal deviation present. No thyromegaly present.  No carotid bruit    Cardiovascular: Normal rate, regular rhythm and normal heart sounds.   No murmur heard.  2 + b/l LE edema. Pulmonary/Chest: Effort normal and breath sounds normal. No respiratory distress. She has no wheezes. She has no rales.  Abdominal: Soft. She exhibits no distension. There is no tenderness.  Lymphadenopathy: She has no cervical adenopathy.  Msk: right foot bandaged and in soft boot Skin: Skin is warm and dry. She is not diaphoretic.  Psychiatric: She has a normal mood and affect. Her behavior is normal.         Assessment & Plan:   See Problem List for Assessment and Plan of chronic medical problems.

## 2016-08-12 NOTE — Patient Instructions (Addendum)
  It was nice to meet you.  Flu vaccine administered today.   Medications reviewed and updated.  No changes recommended at this time.   Please followup in 6 months

## 2016-08-12 NOTE — Assessment & Plan Note (Signed)
Continue gabapentin and cymbalta at current doses

## 2016-08-12 NOTE — Assessment & Plan Note (Signed)
Pain increasing Will follow up with Dr Estanislado Pandy

## 2016-08-12 NOTE — Progress Notes (Signed)
Pre visit review using our clinic review tool, if applicable. No additional management support is needed unless otherwise documented below in the visit note. 

## 2016-08-12 NOTE — Assessment & Plan Note (Signed)
Following with oncology Current treatment - observation

## 2016-08-12 NOTE — Assessment & Plan Note (Addendum)
Management per Dr Cruzita Lederer Sugars becoming better controlled recently Not compliant with a diabetic diet

## 2016-08-12 NOTE — Progress Notes (Signed)
Patient ID: Megan [name], female   DOB: [birth year], 75 y.o.   MRN: [id number]  HPI: Megan [name] is a 75 y.o.-year-old female, returning for f/u for DM2, dx in 2013, and as LADA in 04/2016, insulin-dependent since 2014, uncontrolled, with complications (DKA in the setting of dehydration). Last visit 3 mo ago.  She had foot sx 2.5 weeks ago >> R foot in special shoe  Reviewed hx: She has a complicated medical history, with a diagnosis of ulcerative colitis with significant diarrhea that caused dehydration in the past and episodes of DKA. She had to recent admissions for this in 02/26/2016 and 04/14/2016. While in rehabilitation, her CBGs dropped to 30s on her previous dose of insulin, therefore, her long-acting insulin was decreased. This caused her CBGs to stay in the 200s.  She also has a history of lung cancer diagnosed 09/2013. She had chemotherapy and radiation therapy for this, now immunotherapy. She has malignant cachexia.  Last hemoglobin A1c was: Lab Results  Component Value Date   HGBA1C 10.5 05/04/2016   HGBA1C 12.8 (H) 02/11/2016  12/30/2015: 12% 09/18/2015: 11.3% 06/17/2015: 7.5% 04/08/2015: 10.9% 09/10/2014: 8.5%  11/04/2013: 6.2%  Pt was on a regimen of: - Levemir 20 units at bedtime units after supper - Humalog 6 units 3x a day, before meals + sliding scale: 0-69 = hypoglycemic protocol 70 -120 = 0 units  121- 150 = 2 units 151 - 200 = 3 units 201 - 250 = 5 units 251 - 300 = 8 units 301 - 350 = 11 units 351 - 400 = 15 units  > 400 = 15 units, Call MD She was on Metformin >> stopped when starting insulin.  At last visit, we adjusted to: - Levemir 20 units at bedtime. - Humalog 15 min before a meal  8 units before a smaller meal  12 units before a larger meal   Please do not skip the insulin if sugars are 70-100.   If the sugars are <70, take 1/2 of the Humalog dose. - Humalog Sliding scale: 150-200: + 2 unit 201-250: + 4 units 251-300: + 6  units 301-350: + 8 units >350: + 9 units  Pt checks her sugars 3x a day and they are still very variable: - am: 68, 71-271, 360 >> 87-291 >> 89-269, 318, 533 - 2h after b'fast: n/c >> 171-528 >> n/c - before lunch: n/c >> 113-260 >> 111, 162-470 - 2h after lunch: n/c >> 134-439 >> n/c - before dinner: n/c >> 99-600 >> 70, 96-447 - 2h after dinner: n/c >> 128-479 >> n/c - bedtime: n/c >> 306-594 >> 145-383 - nighttime: n/c No lows. Lowest sugar was 30 >> 68 >> 70; she has hypoglycemia awareness at 50-60.  Highest sugar was 533.  Pt's meals are: - Breakfast: Cereal or oatmeal with blueberries or eggs with sausage - Lunch: Sandwich and soup - Dinner: Meat, vegetables, small amount of starch - Snacks: 3-4 a day: Peanuts  - no CKD, last BUN/creatinine:  Lab Results  Component Value Date   BUN 18.1 06/28/2016   BUN 14 04/15/2016   CREATININE 1.0 06/28/2016   CREATININE 0.62 04/15/2016   - last set of lipids: 04/04/2013: 141/145/41/71 No results found for: CHOL, HDL, LDLCALC, LDLDIRECT, TRIG, CHOLHDL - last eye exam was in 12/2015 ? DR. Dr. Lady Gary. - + numbness and tingling in her feet. PN from [facility].  ROS: Constitutional: + weight gain, + fatigue, + hot flushes Eyes: no blurry vision, no xerophthalmia ENT:  no sore throat, no nodules palpated in throat, no dysphagia/odynophagia, no hoarseness Cardiovascular: no CP/+ SOB/no palpitations/+ leg swelling Respiratory: + cough/+ SOB Gastrointestinal: no N/V/D/C Musculoskeletal: no muscle/+ joint aches Skin: no rashes Neurological: no tremors/numbness/tingling/no dizziness  I reviewed pt's medications, allergies, PMH, social hx, family hx, and changes were documented in the history of present illness. Otherwise, unchanged from my initial visit note.  Past Medical History:  Diagnosis Date  . Cancer ([place]) dx'd 09/2013   Lung ca  . Diabetes mellitus    insulin  . GERD (gastroesophageal reflux disease)    no meds for  .  History of hiatal hernia   . Hx of radiation therapy 12/16/13-01/30/14   lung 66Gy  . Hypertension   . Malignant neoplasm of right upper lobe of lung ([place]) 11/28/2013  . Neuropathy ([place])   . Psoriasis   . Rheumatoid arthritis ([place])   . Shortness of breath dyspnea    with exertion   Past Surgical History:  Procedure Laterality Date  . ABDOMINAL HYSTERECTOMY    . ABDOMINAL SURGERY    . BALLOON DILATION N/A 12/12/2014   Procedure: BALLOON DILATION;  Surgeon: Inda Castle, MD;  Location: [facility];  Service: Endoscopy;  Laterality: N/A;  . CHOLECYSTECTOMY    . COLONOSCOPY N/A 02/07/2016   Procedure: COLONOSCOPY;  Surgeon: Doran Stabler, MD;  Location: [facility] ENDOSCOPY;  Service: Endoscopy;  Laterality: N/A;  . DILATION AND CURETTAGE OF UTERUS    . ESOPHAGOGASTRODUODENOSCOPY N/A 12/12/2014   Procedure: ESOPHAGOGASTRODUODENOSCOPY (EGD);  Surgeon: Inda Castle, MD;  Location: [facility];  Service: Endoscopy;  Laterality: N/A;  with dilation  . ESOPHAGOGASTRODUODENOSCOPY (EGD) WITH PROPOFOL N/A 10/09/2014   Procedure: ESOPHAGOGASTRODUODENOSCOPY (EGD) WITH PROPOFOL;  Surgeon: Inda Castle, MD;  Location: [place];  Service: Endoscopy;  Laterality: N/A;  . FRACTURE SURGERY    . ORIF ANKLE FRACTURE Right 07/04/2015   Procedure: OPEN REDUCTION INTERNAL FIXATION (ORIF) ANKLE FRACTURE;  Surgeon: Newt Minion, MD;  Location: [place];  Service: Orthopedics;  Laterality: Right;  OPEN REDUCTION, INTERNAL FIXATION OF RIGHT ANKLE FRACTURE.   [name] ORIF ANKLE FRACTURE Left 02/10/2016   Procedure: OPEN REDUCTION INTERNAL FIXATION (ORIF) ANKLE FRACTURE;  Surgeon: Newt Minion, MD;  Location: [place];  Service: Orthopedics;  Laterality: Left;  . SAVORY DILATION N/A 10/09/2014   Procedure: SAVORY DILATION;  Surgeon: Inda Castle, MD;  Location: [place];  Service: Endoscopy;  Laterality: N/A;  . TONSILLECTOMY     Social History   Social History  . Marital Status: Divorced    Spouse Name: N/A  .  Number of Children: 0   Occupational History  . Retired Pharmacist, hospital Other   Social History Main Topics  . Smoking status: Former Smoker -- 3.00 packs/day for 45 years    Types: Cigarettes    Quit date: 12/05/1997  . Smokeless tobacco: Never Used  . Alcohol Use: No  . Drug Use: No   Social History Narrative   Patient lives at home alone.   Caffeine Use: 2 cups daily   Current Outpatient Prescriptions on File Prior to Visit  Medication Sig Dispense Refill  . DULoxetine (CYMBALTA) 60 MG capsule     . fluticasone (CUTIVATE) 0.05 % cream Apply cream to rash/psoriasis on sacrum topically twice daily. Apply 1st before Nizoral cream.    . gabapentin (NEURONTIN) 600 MG tablet Take 600 mg by mouth 2 (two) times daily.    [name] HORIZANT 600 MG TBCR TK 2 TS  D UTD  6  . Insulin Detemir (LEVEMIR FLEXPEN) 100 UNIT/ML Pen Inject 20 units into the skin at bedtime. 10 pen 2  . insulin lispro (HUMALOG KWIKPEN) 100 UNIT/ML KiwkPen INJECT 8-20 units 3x a day under skin (Patient taking differently: INJECT 8-20 units 4x a day under skin) 30 mL 5  . Insulin Pen Needle 32G X 5 MM MISC Used to check sugar 5 times daily. 100 each 3  . ketoconazole (NIZORAL) 2 % cream Apply 1 application topically daily. Apply cream to rash/psoriasis to sacrum topically twice a daily. Apply after Cutivate cream.    . ONETOUCH DELICA LANCETS FINE MISC USE TO TEST BLOOD SUGAR FIVE TIMES DAILY AS NEEDED 200 each 11  . ONETOUCH VERIO test strip Use 4x a day 200 each 11  . solifenacin (VESICARE) 5 MG tablet Take 5 mg by mouth daily.     Current Facility-Administered Medications on File Prior to Visit  Medication Dose Route Frequency Provider Last Rate Last Dose  . sodium chloride 0.9 % injection 10 mL  10 mL Intracatheter PRN Curt Bears, MD       Allergies  Allergen Reactions  . Carboplatin Other (See Comments)    Neuropathy  . Remicade [Infliximab] Anaphylaxis  . Hydromorphone Rash   Family History  Problem Relation Age of  Onset  . Other Mother   . Other Father     failure to thrive  . Hypertension Other   . Stroke Other   . Heart attack Other   . Hemachromatosis Other   . Rheum arthritis      both sets of grandparents and father   PE: BP 118/76   Pulse (!) 102   Ht 5' 5.5" (1.664 m)   Wt 164 lb (74.4 kg)   BMI 26.88 kg/m  Wt Readings from Last 3 Encounters:  08/12/16 164 lb (74.4 kg)  07/05/16 156 lb 11.2 oz (71.1 kg)  06/28/16 154 lb (69.9 kg)   Constitutional: Thin, in NAD Eyes: PERRLA, EOMI, no exophthalmos ENT: moist mucous membranes, no thyromegaly, no cervical lymphadenopathy Cardiovascular: tachycardia, RR, No MRG, + bilateral. Peri-ankle edema (pitting) Respiratory: CTA B Gastrointestinal: abdomen soft, NT, ND, BS+ Musculoskeletal: no deformities, strength intact in all 4 Skin: moist, warm, R foot in therap. shoe Neurological: no tremor with outstretched hands, DTR normal in all 4  ASSESSMENT: 1. LADA, insulin-dependent, uncontrolled, with complications - DKA in the setting of dehydration  Pulmonologist: Dr. Chase Caller Oncologist: Dr. Earlie Server Cardiologist: Dr. Sallyanne Kuster Ophthalmologist: Dr. Clifton James  PLAN:  1. Patient with long-standing, uncontrolled diabetes, dx'ed as LADA at last visit, on basal-bolus insulin regimen, which became insufficient. Sugars are still very variable >> will give her an ICR and have her back for a carb counting class. She is counting carbs now, but would like a refresher. - I suggested to:  Patient Instructions  Please continue: - Levemir 20 units at bedtime.  Please change the Humalog dose: - insulin to carb ratio 1:7. If you still see high sugars after meals, then decrease the ICR to 1:6  Please do not skip the insulin if sugars are 70-100.   If the sugars are <70, take 1/2 of the Humalog dose.  Continue: - Humalog Sliding scale: 150-200: + 2 unit 201-250: + 4 units 251-300: + 6 units 301-350: + 8 units >350: + 9 units  Please schedule  an appt with Antonieta Iba with nutrition.  Please return in 3 months with your sugar log.   Please let me  know if the sugars are consistently <80 or >200.  - HbA1c today: 8.2% (better!) - Strongly advised her to start checking sugars at different times of the day - check 3 times a day, rotating checks - advised for yearly eye exams >> she is up-to-date - Return to clinic in 3 mo with sugar log   Philemon Kingdom, MD PhD [facility] Endocrinology

## 2016-08-14 ENCOUNTER — Other Ambulatory Visit: Payer: Self-pay | Admitting: Neurology

## 2016-08-14 DIAGNOSIS — G620 Drug-induced polyneuropathy: Secondary | ICD-10-CM

## 2016-08-14 DIAGNOSIS — T451X5A Adverse effect of antineoplastic and immunosuppressive drugs, initial encounter: Principal | ICD-10-CM

## 2016-08-16 ENCOUNTER — Telehealth: Payer: Self-pay | Admitting: Neurology

## 2016-08-16 ENCOUNTER — Other Ambulatory Visit: Payer: Self-pay | Admitting: *Deleted

## 2016-08-16 MED ORDER — DULOXETINE HCL 60 MG PO CPEP: 60.0000 mg | ORAL_CAPSULE | Freq: Two times a day (BID) | ORAL | 1 refills | Status: DC

## 2016-08-16 NOTE — Telephone Encounter (Signed)
Pt called in asking for a refill on DULoxetine (CYMBALTA) 60 MG capsule. She has made an appt for 11/27. Pt is out.

## 2016-08-16 NOTE — Telephone Encounter (Signed)
Called patient back. She verified she is taking cymbalta '60mg'$  2 times daily. I verified we had correct pharmacy on file. Advised I will send enough to get her to f/u appt. She verbalized understanding.   Sent rx electronically to pt pharmacy as requested.

## 2016-08-22 ENCOUNTER — Telehealth: Payer: Self-pay | Admitting: Gastroenterology

## 2016-08-22 NOTE — Telephone Encounter (Signed)
Patient called and requesting we reschedule her EGD w/ wire guided dilatation under xray guidance from 11/14. She is now scheduled on 11/20 @ [facility] at 9:45. Spoke to [place] in endoscopy, patient notified. She still does not have her instructions, so mailed her a new set of instructions.

## 2016-08-22 NOTE — Telephone Encounter (Signed)
Printed off previous EGD prep instruction, to include diabetic medication instructions and mailed to patient. Only change was date.

## 2016-08-22 NOTE — Progress Notes (Signed)
EGD prep instructions mailed, 08/22/16. jf

## 2016-08-29 ENCOUNTER — Encounter (INDEPENDENT_AMBULATORY_CARE_PROVIDER_SITE_OTHER): Payer: Self-pay | Admitting: Orthopedic Surgery

## 2016-08-29 ENCOUNTER — Ambulatory Visit (INDEPENDENT_AMBULATORY_CARE_PROVIDER_SITE_OTHER): Payer: Medicare Other | Admitting: Orthopedic Surgery

## 2016-08-29 VITALS — Ht 65.5 in | Wt 165.0 lb

## 2016-08-29 DIAGNOSIS — M79671 Pain in right foot: Secondary | ICD-10-CM

## 2016-08-29 DIAGNOSIS — M21611 Bunion of right foot: Secondary | ICD-10-CM

## 2016-08-29 DIAGNOSIS — M792 Neuralgia and neuritis, unspecified: Secondary | ICD-10-CM

## 2016-08-29 DIAGNOSIS — R609 Edema, unspecified: Secondary | ICD-10-CM

## 2016-08-29 NOTE — Progress Notes (Signed)
Office Visit Note   Patient: Megan [name]           Date of Birth: 17-Sep-1941           MRN: [id number] Visit Date: 08/29/2016              Requested by: Binnie Rail, MD [place], [place] [id number] PCP: Binnie Rail, MD   Assessment & Plan: Visit Diagnoses:  1. Bunion of great toe of right foot   2. Pain in right foot   3. Neuropathic pain   4. Peripheral edema     Plan: States will go to [place] and obtain by compression stockings today. Wear these daily. Will follow up in the office as needed  Follow-Up Instructions: Return if symptoms worsen or fail to improve.   Orders:  No orders of the defined types were placed in this encounter.  No orders of the defined types were placed in this encounter.     Procedures: No procedures performed   Clinical Data: No additional findings.   Subjective: Chief Complaint  Patient presents with  . Right Foot - Routine Post Op  . Routine Post Op    status post fusion at the base of the first metatarsal and [employer] osteotomy for her bunion deformity of the right foot on 07/26/2016    Patient presents today for two week follow up of fusion at the base of the first metatarsal and [employer] osteotomy for her bunion deformity of the right foot surgery on 07/26/2016. Patient is approximately 5 weeks post op. She has intermittent stabbing pain at right great toe where bunion deformity was. She is also having pain medial right midfoot. She is taking oxycodone for pain management in evening so she can sleep. She went to [facility] supply to obtain compression stockings but they were out at that time, she is going by there today to try to obtain a pair.    Review of Systems  Constitutional: Negative for chills and fever.  All other systems reviewed and are negative.    Objective: Vital Signs: Ht 5' 5.5" (1.664 m)   Wt 165 lb (74.8 kg)   BMI 27.04 kg/m   Physical Exam  The incisions are healed quite  nicely.  There is no redness, no cellulitis, no drainage, no signs of infection.   She has significant venous stasis swelling in both lower extremities.    Ortho Exam  Specialty Comments:  No specialty comments available.  Imaging: No results found.   PMFS History: Patient Active Problem List   Diagnosis Date Noted  . LADA (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), managed as type 1 ([place]) 05/04/2016  . Syncope 03/25/2016  . Malnutrition of moderate degree 02/29/2016  . Skin breakdown 02/26/2016  . Leukocytosis 02/17/2016  . Diabetic ketoacidosis with coma associated with diabetes mellitus due to underlying condition ([place])   . Abnormal finding on GI tract imaging   . Hematochezia   . Acute kidney injury ([place]) 02/04/2016  . Acute colitis 02/04/2016  . RLS (restless legs syndrome) 12/11/2015  . Diarrhea due to drug 08/11/2015  . Liver dysfunction 08/11/2015  . Lumbago 05/06/2015  . Hot flashes 04/14/2015  . Peripheral edema 04/14/2015  . Rheumatoid arthritis ([place]) 04/14/2015  . Encounter for antineoplastic immunotherapy 04/06/2015  . Emphysema of lung ([place]) 11/26/2014  . Radiation-induced esophageal stricture 10/01/2014  . Tachycardia 08/27/2014  . Depression 08/27/2014  . Palliative care encounter 08/21/2014  . DNR (do  not resuscitate) discussion 08/21/2014  . Neuropathic pain 08/21/2014  . Cancer associated pain 07/29/2014  . Malignant cachexia ([place]) 07/29/2014  . Lung collapse 07/29/2014  . Fatigue 07/18/2014  . Neuropathy due to chemotherapeutic drug ([place]) 07/18/2014  . Leg cramps 07/18/2014  . Hypoalbuminemia 07/18/2014  . Thrombocytopenia, unspecified 07/18/2014  . Dyspnea 04/28/2014  . Radiation esophagitis 04/10/2014  . Right-sided chest wall pain 04/01/2014  . Cancer of middle lobe of lung ([place]) 01/17/2014  . Malignant neoplasm of right upper lobe of lung ([place]) 11/28/2013  . Lung mass 11/09/2013   Past Medical History:  Diagnosis Date  . Cancer ([place]) dx'd  09/2013   Lung ca  . Diabetes mellitus    insulin  . GERD (gastroesophageal reflux disease)    no meds for  . History of hiatal hernia   . Hx of radiation therapy 12/16/13-01/30/14   lung 66Gy  . Hypertension   . Malignant neoplasm of right upper lobe of lung ([place]) 11/28/2013  . Neuropathy ([place])   . Psoriasis   . Rheumatoid arthritis ([place])   . Shortness of breath dyspnea    with exertion    Family History  Problem Relation Age of Onset  . Other Mother   . Other Father     failure to thrive  . Hypertension Other   . Stroke Other   . Heart attack Other   . Hemachromatosis Other   . Rheum arthritis      both sets of grandparents and father    Past Surgical History:  Procedure Laterality Date  . ABDOMINAL HYSTERECTOMY    . ABDOMINAL SURGERY    . BALLOON DILATION N/A 12/12/2014   Procedure: BALLOON DILATION;  Surgeon: Inda Castle, MD;  Location: [facility];  Service: Endoscopy;  Laterality: N/A;  . CHOLECYSTECTOMY    . COLONOSCOPY N/A 02/07/2016   Procedure: COLONOSCOPY;  Surgeon: Doran Stabler, MD;  Location: [facility] ENDOSCOPY;  Service: Endoscopy;  Laterality: N/A;  . DILATION AND CURETTAGE OF UTERUS    . ESOPHAGOGASTRODUODENOSCOPY N/A 12/12/2014   Procedure: ESOPHAGOGASTRODUODENOSCOPY (EGD);  Surgeon: Inda Castle, MD;  Location: [facility];  Service: Endoscopy;  Laterality: N/A;  with dilation  . ESOPHAGOGASTRODUODENOSCOPY (EGD) WITH PROPOFOL N/A 10/09/2014   Procedure: ESOPHAGOGASTRODUODENOSCOPY (EGD) WITH PROPOFOL;  Surgeon: Inda Castle, MD;  Location: [facility];  Service: Endoscopy;  Laterality: N/A;  . FRACTURE SURGERY    . ORIF ANKLE FRACTURE Right 07/04/2015   Procedure: OPEN REDUCTION INTERNAL FIXATION (ORIF) ANKLE FRACTURE;  Surgeon: Newt Minion, MD;  Location: [facility];  Service: Orthopedics;  Laterality: Right;  OPEN REDUCTION, INTERNAL FIXATION OF RIGHT ANKLE FRACTURE.   [name] ORIF ANKLE FRACTURE Left 02/10/2016   Procedure: OPEN REDUCTION INTERNAL FIXATION  (ORIF) ANKLE FRACTURE;  Surgeon: Newt Minion, MD;  Location: [facility];  Service: Orthopedics;  Laterality: Left;  . SAVORY DILATION N/A 10/09/2014   Procedure: SAVORY DILATION;  Surgeon: Inda Castle, MD;  Location: [facility];  Service: Endoscopy;  Laterality: N/A;  . TONSILLECTOMY     Social History   Occupational History  . Retired Other   Social History Main Topics  . Smoking status: Former Smoker    Packs/day: 3.00    Years: 45.00    Types: Cigarettes    Quit date: 12/05/1997  . Smokeless tobacco: Never Used  . Alcohol use No  . Drug use: No  . Sexual activity: Not Currently

## 2016-09-01 ENCOUNTER — Encounter: Payer: Self-pay | Admitting: Dietician

## 2016-09-01 ENCOUNTER — Encounter: Payer: Medicare Other | Attending: Internal Medicine | Admitting: Dietician

## 2016-09-01 DIAGNOSIS — Z713 Dietary counseling and surveillance: Secondary | ICD-10-CM | POA: Insufficient documentation

## 2016-09-01 DIAGNOSIS — E139 Other specified diabetes mellitus without complications: Secondary | ICD-10-CM

## 2016-09-01 DIAGNOSIS — E109 Type 1 diabetes mellitus without complications: Secondary | ICD-10-CM | POA: Diagnosis present

## 2016-09-01 NOTE — Patient Instructions (Signed)
Plan:  Aim for 3-4 Carb Choices per meal (45-60 grams) +/- 1 either way  Aim for 0-1 Carbs per snack if hungry   Avoid carbohydrate in your morning and afternoon snack unless your blood sugar is low.  Have up to 15-30 grams of carbohydrates with your bedtime snack. Include protein in moderation with your meals and snacks Consider reading food labels for Total Carbohydrate and Fat Grams of foods Consider  increasing your activity level by walking or armchair exercises or bike for 30 minutes daily as tolerated Consider checking BG at alternate times per day as directed by MD  Consider taking medication as directed by MD  Your Insulin to Carbohydrate ratio is 1:7  This means that for each 7 grams of carbohydrate that you are going to eat, you should take 1 unit of insulin.  Example:  If you are going to eat 60 grams of carbohydrate at the meal, 60 divided by 7 = 8 units of insulin.  Add 8 units plus the number of units of insulin that you need for your blood sugar correction based on your blood sugar reading.  Consider the Type 1 Diabetes support group.  Call first to know where they will meet.

## 2016-09-02 NOTE — Progress Notes (Signed)
Diabetes Self-Management Education  Visit Type:  (P) Follow-up  Appt. Start Time: 1445 Appt. End Time: 1615  09/02/2016  [name], identified by name and date of birth, is a 75 y.o. female with a diagnosis of Diabetes: (P)  (LADA).  She currently takes 20 units of Levemir q HS and Humalog before each meal and HS "based on a sliding scale based on CBG".    Patient is here alone.  She had a recent visit to Dr. Cruzita Lederer and does not understand the insulin to carbohydrate ratio and how to apply this.  She would also like to learn more about healthy eating on a diabetic diet to help not gain further weight.  UBW of 155-160 lbs.  Other hx includes lung cancer since 2014. She is followed by Dr. Julien Nordmann.  She recently told him that the medication was too strong and she no longer wished to take it.  Her weight decreased to 121 lbs in August because I couldn't eat because of a "burned esophagus".  Her weight has increased to 167 lbs today.  She states that her esophagus needs stretched again.  Other hx incudes, HTN, GERD, neuropathy, EGFR 57, and ulcerative colitis history but reports that this is not currently bothering her.    Currently a friend is living with her.  They share the shopping but she does most of the cooking.  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  ASSESSMENT  Height 5' 5.5" (1.664 m), weight 167 lb (75.8 kg). Body mass index is 27.37 kg/m.       Diabetes Self-Management Education - 09/01/16 1513      Health Coping   How would you rate your overall health? (P)  Good     Complications   How often do you check your blood sugar? (P)  > 4 times/day   Fasting Blood glucose range (mg/dL) (P)  >200   Postprandial Blood glucose range (mg/dL) (P)  >200   Number of hypoglycemic episodes per month (P)  0  eats to avoid low blood sugar   Number of hyperglycemic episodes per week (P)  21   Can you tell when your blood sugar is high? (P)  No   What do you do if  your blood sugar is high? (P)  takes about 25 units of fast acting insulin   Have you had a dilated eye exam in the past 12 months? (P)  Yes   Have you had a dental exam in the past 12 months? (P)  Yes   Are you checking your feet? (P)  Yes   How many days per week are you checking your feet? (P)  7     Dietary Intake   Breakfast (P)  instant oatmeal oatmeal, frozen fruit (berries or peaches) or occasional eggs, bacon, toast and jelly AND black coffee   Snack (morning) (P)  apple or protein bar or 1/2 pimento cheese crackers, or PB and crackers or occasional candy   Lunch (P)  sandwich (roast beef and tomato on 45 calorie [employer] bread) and Pringles and diet coke and 1 1/2 cups sugar free ice cream   Snack (afternoon) (P)  1 1/2 cups sugar free ice cream OR snacks at friends homes   Dinner (P)  Meat, 1/2 potato, vegetables OR hamburger on bun with corn on the cob or fried okra or canned baked beans   Snack (evening) (P)  apple   Beverage(s) (P)  water, diet soda  Exercise   Exercise Type (P)  ADL's   How many days per week to you exercise? (P)  0   How many minutes per day do you exercise? (P)  0   Total minutes per week of exercise (P)  0     Patient Education   Previous Diabetes Education (P)  Yes (please comment)  2014, 2016 at our office     Subsequent Visit   Since your last visit have you continued or begun to take your medications as prescribed? (P)  Yes      Learning Objective:  Patient will have a greater understanding of diabetes self-management. Patient education plan is to attend individual and/or group sessions per assessed needs and concerns.   Plan:   Patient Instructions  Plan:  Aim for 3-4 Carb Choices per meal (45-60 grams) +/- 1 either way  Aim for 0-1 Carbs per snack if hungry   Avoid carbohydrate in your morning and afternoon snack unless your blood sugar is low.  Have up to 15-30 grams of carbohydrates with your bedtime snack. Include protein in  moderation with your meals and snacks Consider reading food labels for Total Carbohydrate and Fat Grams of foods Consider  increasing your activity level by walking or armchair exercises or bike for 30 minutes daily as tolerated Consider checking BG at alternate times per day as directed by MD  Consider taking medication as directed by MD  Your Insulin to Carbohydrate ratio is 1:7  This means that for each 7 grams of carbohydrate that you are going to eat, you should take 1 unit of insulin.  Example:  If you are going to eat 60 grams of carbohydrate at the meal, 60 divided by 7 = 8 units of insulin.  Add 8 units plus the number of units of insulin that you need for your blood sugar correction based on your blood sugar reading.  Consider the Type 1 Diabetes support group.  Call first to know where they will meet.   Expected Outcomes:   Patient was able to return demonstrate.  She demonstrated interest.  Discussed importance of good nutrition and not restricting intake.    Education material provided: Living Well with Diabetes, Food label handouts, A1C conversion sheet, Meal plan card, My Plate, Snack sheet and Support group flyer  If problems or questions, patient to contact team via:  Phone and Email  Future DSME appointment: -  Patient to return in January prior to her appointment with Dr. Cruzita Lederer the same day.

## 2016-09-04 NOTE — Progress Notes (Deleted)
*IMAGE* Office Visit Note  Patient: Megan [name]             Date of Birth: Sep 15, 1941           MRN: [id number]             PCP: Binnie Rail, MD Referring: Binnie Rail, MD Visit Date: 09/09/2016 Occupation:'@GUAROCC'$ @    Subjective:  No chief complaint on file.   History of Present Illness: Megan [name] is a 75 y.o. female ***   Activities of Daily Living:  Patient reports morning stiffness for *** {minute/hour:19697}.   Patient {ACTIONS;DENIES/REPORTS:21021675::"Denies"} nocturnal pain.  Difficulty dressing/grooming: {ACTIONS;DENIES/REPORTS:21021675::"Denies"} Difficulty climbing stairs: {ACTIONS;DENIES/REPORTS:21021675::"Denies"} Difficulty getting out of chair: {ACTIONS;DENIES/REPORTS:21021675::"Denies"} Difficulty using hands for taps, buttons, cutlery, and/or writing: {ACTIONS;DENIES/REPORTS:21021675::"Denies"}   No Rheumatology ROS completed.   PMFS History:  Patient Active Problem List   Diagnosis Date Noted  . Megan (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), managed as type 1 ([name]) 05/04/2016  . Megan [name] 03/25/2016  . Megan [name] of moderate degree 02/29/2016  . Megan [name] 02/26/2016  . Megan [name] 02/17/2016  . Megan [name] with coma associated with diabetes mellitus due to underlying condition (Megan [name])   . Abnormal finding on GI tract imaging   . Hematochezia   . Acute kidney injury (Megan [name]) 02/04/2016  . Acute colitis 02/04/2016  . RLS (restless legs syndrome) 12/11/2015  . Diarrhea due to drug 08/11/2015  . Liver dysfunction 08/11/2015  . Lumbago 05/06/2015  . Hot flashes 04/14/2015  . Peripheral edema 04/14/2015  . Rheumatoid arthritis (Megan [name]) 04/14/2015  . Encounter for antineoplastic immunotherapy 04/06/2015  . Emphysema of lung (Megan [name]) 11/26/2014  . Radiation-induced esophageal stricture 10/01/2014  . Tachycardia 08/27/2014  . Depression 08/27/2014  . Palliative care encounter 08/21/2014  . DNR (do not resuscitate) discussion  08/21/2014  . Neuropathic pain 08/21/2014  . Cancer associated pain 07/29/2014  . Malignant cachexia ([place]) 07/29/2014  . Lung collapse 07/29/2014  . Fatigue 07/18/2014  . Neuropathy due to chemotherapeutic drug (Megan [name]) 07/18/2014  . Leg cramps 07/18/2014  . Hypoalbuminemia 07/18/2014  . Thrombocytopenia, unspecified 07/18/2014  . Dyspnea 04/28/2014  . Radiation esophagitis 04/10/2014  . Right-sided chest wall pain 04/01/2014  . Cancer of middle lobe of lung ([place]) 01/17/2014  . Malignant neoplasm of right upper lobe of lung ([name]) 11/28/2013  . Lung mass 11/09/2013    Past Medical History:  Diagnosis Date  . Cancer (Megan [name]) dx'd 09/2013   Lung ca  . Diabetes mellitus    insulin  . GERD (gastroesophageal reflux disease)    no meds for  . History of hiatal hernia   . Hx of radiation therapy 12/16/13-01/30/14   lung 66Gy  . Hypertension   . Malignant neoplasm of right upper lobe of lung (Megan [name]) 11/28/2013  . Neuropathy (Megan [name])   . Psoriasis   . Rheumatoid arthritis (Megan [name])   . Shortness of breath dyspnea    with exertion    Family History  Problem Relation Age of Onset  . Other Mother   . Other Father     failure to thrive  . Hypertension Other   . Stroke Other   . Heart attack Other   . Hemachromatosis Other   . Rheum arthritis      both sets of grandparents and father   Past Surgical History:  Procedure Laterality Date  . ABDOMINAL HYSTERECTOMY    . ABDOMINAL SURGERY    . BALLOON DILATION N/A 12/12/2014   Procedure: BALLOON DILATION;  Surgeon: Megan Castle,  MD;  Location: [facility];  Service: Endoscopy;  Laterality: N/A;  . CHOLECYSTECTOMY    . COLONOSCOPY N/A 02/07/2016   Procedure: COLONOSCOPY;  Surgeon: Megan Stabler, MD;  Location: [facility] ENDOSCOPY;  Service: Endoscopy;  Laterality: N/A;  . DILATION AND CURETTAGE OF UTERUS    . ESOPHAGOGASTRODUODENOSCOPY N/A 12/12/2014   Procedure: ESOPHAGOGASTRODUODENOSCOPY (EGD);  Surgeon: Megan Castle, MD;  Location: [facility];   Service: Endoscopy;  Laterality: N/A;  with dilation  . ESOPHAGOGASTRODUODENOSCOPY (EGD) WITH PROPOFOL N/A 10/09/2014   Procedure: ESOPHAGOGASTRODUODENOSCOPY (EGD) WITH PROPOFOL;  Surgeon: Megan Castle, MD;  Location: [name];  Service: Endoscopy;  Laterality: N/A;  . FRACTURE SURGERY    . ORIF ANKLE FRACTURE Right 07/04/2015   Procedure: OPEN REDUCTION INTERNAL FIXATION (ORIF) ANKLE FRACTURE;  Surgeon: Megan Minion, MD;  Location: [facility];  Service: Orthopedics;  Laterality: Right;  OPEN REDUCTION, INTERNAL FIXATION OF RIGHT ANKLE FRACTURE.   [name] ORIF ANKLE FRACTURE Left 02/10/2016   Procedure: OPEN REDUCTION INTERNAL FIXATION (ORIF) ANKLE FRACTURE;  Surgeon: Megan Minion, MD;  Location: [name];  Service: Orthopedics;  Laterality: Left;  . SAVORY DILATION N/A 10/09/2014   Procedure: SAVORY DILATION;  Surgeon: Megan Castle, MD;  Location: [facility];  Service: Endoscopy;  Laterality: N/A;  . TONSILLECTOMY     Social History   Social History Narrative   Patient lives at home alone.   Caffeine Use: 2 cups daily     Objective: Vital Signs: There were no vitals taken for this visit.   Physical Exam   Musculoskeletal Exam: ***  CDAI Exam: No CDAI exam completed.    Investigation: No additional findings.   Imaging: No results found.  Speciality Comments: No specialty comments available.    Procedures:  No procedures performed Allergies: Carboplatin; Remicade [infliximab]; and Hydromorphone   Assessment / Plan: Visit Diagnoses: No diagnosis found.    Orders: No orders of the defined types were placed in this encounter.  No orders of the defined types were placed in this encounter.   Face-to-face time spent with patient was *** minutes. 50% of time was spent in counseling and coordination of care.  Follow-Up Instructions: No Follow-up on file.

## 2016-09-08 ENCOUNTER — Encounter: Payer: Self-pay | Admitting: *Deleted

## 2016-09-08 ENCOUNTER — Ambulatory Visit (INDEPENDENT_AMBULATORY_CARE_PROVIDER_SITE_OTHER): Payer: Medicare Other | Admitting: Orthopedic Surgery

## 2016-09-08 VITALS — Ht 65.0 in | Wt 167.0 lb

## 2016-09-08 DIAGNOSIS — I10 Essential (primary) hypertension: Secondary | ICD-10-CM | POA: Insufficient documentation

## 2016-09-08 DIAGNOSIS — L4050 Arthropathic psoriasis, unspecified: Secondary | ICD-10-CM | POA: Insufficient documentation

## 2016-09-08 DIAGNOSIS — E1140 Type 2 diabetes mellitus with diabetic neuropathy, unspecified: Secondary | ICD-10-CM

## 2016-09-08 DIAGNOSIS — M21611 Bunion of right foot: Secondary | ICD-10-CM

## 2016-09-08 DIAGNOSIS — E119 Type 2 diabetes mellitus without complications: Secondary | ICD-10-CM

## 2016-09-08 DIAGNOSIS — L409 Psoriasis, unspecified: Secondary | ICD-10-CM

## 2016-09-08 DIAGNOSIS — I152 Hypertension secondary to endocrine disorders: Secondary | ICD-10-CM | POA: Insufficient documentation

## 2016-09-08 HISTORY — DX: Arthropathic psoriasis, unspecified: L40.50

## 2016-09-08 HISTORY — DX: Type 2 diabetes mellitus without complications: E11.9

## 2016-09-08 HISTORY — DX: Type 2 diabetes mellitus with diabetic neuropathy, unspecified: E11.40

## 2016-09-08 NOTE — Progress Notes (Signed)
Office Visit Note   Patient: Megan [name]           Date of Birth: 1941-10-31           MRN: [id number] Visit Date: 09/08/2016              Requested by: Binnie Rail, MD [place], [place] [address] PCP: Binnie Rail, MD   Assessment & Plan: Visit Diagnoses:  1. Bunion of great toe of right foot     Plan: Follow-up in 3 weeks and start wearing her compression stockings. Patient is status post fusion the base of the first metatarsal medial cuneiform as well as Chevron osteotomy for bunion. The bony prominence on the plantar aspect the rocker-bottom deformity has resolved patient has increased venous stasis swelling in the importance of her getting the compression stockings was discussed. Patient is currently ambulating without problems with her balance at this time.  Follow-Up Instructions: Return in about 3 weeks (around 09/29/2016).   Orders:  No orders of the defined types were placed in this encounter.  No orders of the defined types were placed in this encounter.     Procedures: No procedures performed   Clinical Data: No additional findings.   Subjective: Chief Complaint  Patient presents with  . Right Foot - Routine Post Op    07/26/16 right fusion base of the 1st MT and lisfranc joint right chevron osteotomy     Patient is 6 weeks  s/p right fusion base of the 1st MT and LF joint right chevron    Review of Systems   Objective: Vital Signs: Ht '5\' 5"'$  (1.651 m)   Wt 167 lb (75.8 kg)   BMI 27.79 kg/m   Physical Exam on examination patient has increased venous swelling there is no callus no plantar ulcers. Her toes are straight no overlapping of her toes. There is no redness no cellulitis no drainage. She has a little bit of a hard soft tissue mass about 3 mm in diameter the proximal incision we will see how this does with time and with compression.  Ortho Exam  Specialty Comments:  No specialty comments available.  Imaging: No  results found.   PMFS History: Patient Active Problem List   Diagnosis Date Noted  . Psoriasis 09/08/2016  . Psoriatic arthritis ([place]) 09/08/2016  . Hypertension 09/08/2016  . Diabetic neuropathy ([place]) 09/08/2016  . Diabetes ([place]) 09/08/2016  . LADA (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), managed as type 1 ([place]) 05/04/2016  . Syncope 03/25/2016  . Malnutrition of moderate degree 02/29/2016  . Skin breakdown 02/26/2016  . Leukocytosis 02/17/2016  . Diabetic ketoacidosis with coma associated with diabetes mellitus due to underlying condition ([place])   . Abnormal finding on GI tract imaging   . Hematochezia   . Acute kidney injury ([place]) 02/04/2016  . Acute colitis 02/04/2016  . RLS (restless legs syndrome) 12/11/2015  . Diarrhea due to drug 08/11/2015  . Liver dysfunction 08/11/2015  . Lumbago 05/06/2015  . Hot flashes 04/14/2015  . Peripheral edema 04/14/2015  . Rheumatoid arthritis ([place]) 04/14/2015  . Encounter for antineoplastic immunotherapy 04/06/2015  . Emphysema of lung ([place]) 11/26/2014  . Radiation-induced esophageal stricture 10/01/2014  . Tachycardia 08/27/2014  . Depression 08/27/2014  . Palliative care encounter 08/21/2014  . DNR (do not resuscitate) discussion 08/21/2014  . Neuropathic pain 08/21/2014  . Cancer associated pain 07/29/2014  . Malignant cachexia ([place]) 07/29/2014  . Lung collapse 07/29/2014  . Fatigue 07/18/2014  .  Neuropathy due to chemotherapeutic drug ([place]) 07/18/2014  . Leg cramps 07/18/2014  . Hypoalbuminemia 07/18/2014  . Thrombocytopenia, unspecified 07/18/2014  . Dyspnea 04/28/2014  . Radiation esophagitis 04/10/2014  . Right-sided chest wall pain 04/01/2014  . Cancer of middle lobe of lung ([place]) 01/17/2014  . Malignant neoplasm of right upper lobe of lung ([place]) 11/28/2013  . Lung mass 11/09/2013   Past Medical History:  Diagnosis Date  . Cancer ([place]) dx'd 09/2013   Lung ca  . Diabetes ([place]) 09/08/2016  . Diabetes mellitus     insulin  . Diabetic neuropathy ([place]) 09/08/2016  . GERD (gastroesophageal reflux disease)    no meds for  . History of hiatal hernia   . Hx of radiation therapy 12/16/13-01/30/14   lung 66Gy  . Hypertension   . Malignant neoplasm of right upper lobe of lung ([place]) 11/28/2013  . Neuropathy ([place])   . Psoriasis   . Psoriatic arthritis ([place]) 09/08/2016  . Rheumatoid arthritis ([place])   . Shortness of breath dyspnea    with exertion    Family History  Problem Relation Age of Onset  . Other Mother   . Other Father     failure to thrive  . Hypertension Other   . Stroke Other   . Heart attack Other   . Hemachromatosis Other   . Rheum arthritis      both sets of grandparents and father    Past Surgical History:  Procedure Laterality Date  . ABDOMINAL HYSTERECTOMY    . ABDOMINAL SURGERY    . BALLOON DILATION N/A 12/12/2014   Procedure: BALLOON DILATION;  Surgeon: Inda Castle, MD;  Location: [facility];  Service: Endoscopy;  Laterality: N/A;  . CHOLECYSTECTOMY    . COLONOSCOPY N/A 02/07/2016   Procedure: COLONOSCOPY;  Surgeon: Doran Stabler, MD;  Location: [facility] ENDOSCOPY;  Service: Endoscopy;  Laterality: N/A;  . DILATION AND CURETTAGE OF UTERUS    . ESOPHAGOGASTRODUODENOSCOPY N/A 12/12/2014   Procedure: ESOPHAGOGASTRODUODENOSCOPY (EGD);  Surgeon: Inda Castle, MD;  Location: [facility];  Service: Endoscopy;  Laterality: N/A;  with dilation  . ESOPHAGOGASTRODUODENOSCOPY (EGD) WITH PROPOFOL N/A 10/09/2014   Procedure: ESOPHAGOGASTRODUODENOSCOPY (EGD) WITH PROPOFOL;  Surgeon: Inda Castle, MD;  Location: [facility];  Service: Endoscopy;  Laterality: N/A;  . FRACTURE SURGERY    . ORIF ANKLE FRACTURE Right 07/04/2015   Procedure: OPEN REDUCTION INTERNAL FIXATION (ORIF) ANKLE FRACTURE;  Surgeon: Newt Minion, MD;  Location: [facility];  Service: Orthopedics;  Laterality: Right;  OPEN REDUCTION, INTERNAL FIXATION OF RIGHT ANKLE FRACTURE.   [name] ORIF ANKLE FRACTURE Left 02/10/2016   Procedure:  OPEN REDUCTION INTERNAL FIXATION (ORIF) ANKLE FRACTURE;  Surgeon: Newt Minion, MD;  Location: [facility];  Service: Orthopedics;  Laterality: Left;  . SAVORY DILATION N/A 10/09/2014   Procedure: SAVORY DILATION;  Surgeon: Inda Castle, MD;  Location: [facility];  Service: Endoscopy;  Laterality: N/A;  . TONSILLECTOMY     Social History   Occupational History  . Retired Other   Social History Main Topics  . Smoking status: Former Smoker    Packs/day: 3.00    Years: 45.00    Types: Cigarettes    Quit date: 12/05/1997  . Smokeless tobacco: Never Used  . Alcohol use No  . Drug use: No  . Sexual activity: Not Currently

## 2016-09-09 ENCOUNTER — Ambulatory Visit: Payer: Medicare Other | Admitting: Rheumatology

## 2016-09-09 ENCOUNTER — Ambulatory Visit (INDEPENDENT_AMBULATORY_CARE_PROVIDER_SITE_OTHER): Payer: Medicare Other | Admitting: Rheumatology

## 2016-09-09 VITALS — BP 136/64 | HR 102 | Resp 16 | Ht 65.5 in | Wt 165.0 lb

## 2016-09-09 DIAGNOSIS — L409 Psoriasis, unspecified: Secondary | ICD-10-CM | POA: Diagnosis not present

## 2016-09-09 DIAGNOSIS — C342 Malignant neoplasm of middle lobe, bronchus or lung: Secondary | ICD-10-CM

## 2016-09-09 DIAGNOSIS — L4050 Arthropathic psoriasis, unspecified: Secondary | ICD-10-CM

## 2016-09-09 DIAGNOSIS — I10 Essential (primary) hypertension: Secondary | ICD-10-CM

## 2016-09-09 DIAGNOSIS — E118 Type 2 diabetes mellitus with unspecified complications: Secondary | ICD-10-CM

## 2016-09-09 DIAGNOSIS — E1149 Type 2 diabetes mellitus with other diabetic neurological complication: Secondary | ICD-10-CM

## 2016-09-09 LAB — HM DIABETES EYE EXAM

## 2016-09-09 MED ORDER — CLOBETASOL PROPIONATE 0.05 % EX CREA
1.0000 | TOPICAL_CREAM | Freq: Two times a day (BID) | CUTANEOUS | 1 refills | Status: DC
Start: 2016-09-09 — End: 2016-12-12

## 2016-09-09 NOTE — Progress Notes (Signed)
*IMAGE* Office Visit Note  Patient: Megan [name]             Date of Birth: 26-Nov-1940           MRN: [id number]             PCP: Binnie Rail, MD Referring: Binnie Rail, MD Visit Date: 09/09/2016 Occupation:'@GUAROCC'$ @    Subjective:  Pain in hands   History of Present Illness: Megan [name] is a 75 y.o. female with history of psoriasis and psoriatic arthritis. She returns today after her last visit in June 2016. She states she has had problems with frequent falls which is stopped after stopping her losartan. She recalls having right ankle joint fracture, 2 months later left fibular fracture and then 2 months later bilateral metatarsal fractures which were all associated with syncopal episodes and falls. 7 weeks ago she underwent right foot reconstruction surgery by Dr. due to. She states she still has a lot of pain in the scar tissue. She also has neuropathy in her left hand and to some extent in her right hand. Her last chemotherapy was in January of 2017 and his next is a schedule in June 2018 per patient. Her arthritis was better controlled all she was receiving chemotherapy. Now she is having flares of psoriasis. She describes last flare was on her lower extremities. She's having increased joint pain which she describes in her hands, hips, knee joints. She states Dr. Sharol Given did x-ray of her left knee recently and told her it was bone on bone. She had 2 cortisone shots. The last  she reports was 4-5 months ago. She recalls having rectal bleeding about 6 months ago which was diagnosed as colitis but resolve by itself without treatment. She is scheduled for esophageal stretching in November 2017.  Activities of Daily Living:  Patient reports morning stiffness for 30 minutes.   Patient Denies nocturnal pain.  Difficulty dressing/grooming: Reports Difficulty climbing stairs: Reports Difficulty getting out of chair: Reports Difficulty using hands for taps, buttons, cutlery, and/or  writing: Denies   Review of Systems  Constitutional: Positive for fatigue. Negative for night sweats, weight gain, weight loss and weakness.  HENT: Negative for mouth sores, trouble swallowing, trouble swallowing, mouth dryness and nose dryness.   Eyes: Positive for dryness. Negative for pain, redness and visual disturbance.  Respiratory: Negative for cough, shortness of breath and difficulty breathing.   Cardiovascular: Negative for chest pain, palpitations, hypertension, irregular heartbeat and swelling in legs/feet.  Gastrointestinal: Negative for blood in stool, constipation and diarrhea.  Endocrine: Negative for increased urination.  Genitourinary: Negative for vaginal dryness.  Musculoskeletal: Positive for arthralgias, joint pain and morning stiffness. Negative for joint swelling, myalgias, muscle weakness, muscle tenderness and myalgias.  Skin: Positive for rash. Negative for color change, hair loss, skin tightness, ulcers and sensitivity to sunlight.  Allergic/Immunologic: Negative for susceptible to infections.  Neurological: Negative for dizziness, memory loss and night sweats.  Hematological: Negative for swollen glands.  Psychiatric/Behavioral: Negative for depressed mood and sleep disturbance. The patient is not nervous/anxious.     PMFS History:  Patient Active Problem List   Diagnosis Date Noted  . Psoriatic arthropathy ([place]) 09/08/2016  . Psoriatic arthritis ([place]) 09/08/2016  . Hypertension 09/08/2016  . Diabetic neuropathy ([place]) 09/08/2016  . Diabetes ([place]) 09/08/2016  . LADA (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), managed as type 1 ([place]) 05/04/2016  . Syncope 03/25/2016  . Malnutrition of moderate degree 02/29/2016  . Skin breakdown 02/26/2016  .  Leukocytosis 02/17/2016  . Diabetic ketoacidosis with coma associated with diabetes mellitus due to underlying condition ([place])   . Abnormal finding on GI tract imaging   . Hematochezia   . Acute kidney injury ([place])  02/04/2016  . Acute colitis 02/04/2016  . RLS (restless legs syndrome) 12/11/2015  . Diarrhea due to drug 08/11/2015  . Liver dysfunction 08/11/2015  . Lumbago 05/06/2015  . Hot flashes 04/14/2015  . Peripheral edema 04/14/2015  . Rheumatoid arthritis ([place]) 04/14/2015  . Encounter for antineoplastic immunotherapy 04/06/2015  . Emphysema of lung ([place]) 11/26/2014  . Radiation-induced esophageal stricture 10/01/2014  . Tachycardia 08/27/2014  . Depression 08/27/2014  . Palliative care encounter 08/21/2014  . DNR (do not resuscitate) discussion 08/21/2014  . Neuropathic pain 08/21/2014  . Cancer associated pain 07/29/2014  . Malignant cachexia ([place]) 07/29/2014  . Lung collapse 07/29/2014  . Fatigue 07/18/2014  . Neuropathy due to chemotherapeutic drug ([place]) 07/18/2014  . Leg cramps 07/18/2014  . Hypoalbuminemia 07/18/2014  . Thrombocytopenia, unspecified 07/18/2014  . Dyspnea 04/28/2014  . Radiation esophagitis 04/10/2014  . Right-sided chest wall pain 04/01/2014  . Cancer of middle lobe of lung ([place]) 01/17/2014  . Malignant neoplasm of right upper lobe of lung ([place]) 11/28/2013  . Lung mass 11/09/2013    Past Medical History:  Diagnosis Date  . Cancer ([place]) dx'd 09/2013   Lung ca  . Diabetes ([place]) 09/08/2016  . Diabetes mellitus    insulin  . Diabetic neuropathy ([place]) 09/08/2016  . GERD (gastroesophageal reflux disease)    no meds for  . History of hiatal hernia   . Hx of radiation therapy 12/16/13-01/30/14   lung 66Gy  . Hypertension   . Malignant neoplasm of right upper lobe of lung ([place]) 11/28/2013  . Neuropathy ([place])   . Psoriasis   . Psoriatic arthritis ([place]) 09/08/2016  . Rheumatoid arthritis ([place])   . Shortness of breath dyspnea    with exertion    Family History  Problem Relation Age of Onset  . Other Mother   . Other Father     failure to thrive  . Hypertension Other   . Stroke Other   . Heart attack Other   . Hemachromatosis Other   . Rheum arthritis       both sets of grandparents and father   Past Surgical History:  Procedure Laterality Date  . ABDOMINAL HYSTERECTOMY    . ABDOMINAL SURGERY    . BALLOON DILATION N/A 12/12/2014   Procedure: BALLOON DILATION;  Surgeon: Inda Castle, MD;  Location: [facility];  Service: Endoscopy;  Laterality: N/A;  . CHOLECYSTECTOMY    . COLONOSCOPY N/A 02/07/2016   Procedure: COLONOSCOPY;  Surgeon: Doran Stabler, MD;  Location[facility] ENDOSCOPY;  Service: Endoscopy;  Laterality: N/A;  . DILATION AND CURETTAGE OF UTERUS    . ESOPHAGOGASTRODUODENOSCOPY N/A 12/12/2014   Procedure: ESOPHAGOGASTRODUODENOSCOPY (EGD);  Surgeon: Inda Castle, MD;  Location: [facility];  Service: Endoscopy;  Laterality: N/A;  with dilation  . ESOPHAGOGASTRODUODENOSCOPY (EGD) WITH PROPOFOL N/A 10/09/2014   Procedure: ESOPHAGOGASTRODUODENOSCOPY (EGD) WITH PROPOFOL;  Surgeon: Inda Castle, MD;  Location: [facility];  Service: Endoscopy;  Laterality: N/A;  . FRACTURE SURGERY    . ORIF ANKLE FRACTURE Right 07/04/2015   Procedure: OPEN REDUCTION INTERNAL FIXATION (ORIF) ANKLE FRACTURE;  Surgeon: Newt Minion, MD;  Location: [facility];  Service: Orthopedics;  Laterality: Right;  OPEN REDUCTION, INTERNAL FIXATION OF RIGHT ANKLE FRACTURE.   [name] ORIF ANKLE FRACTURE Left  02/10/2016   Procedure: OPEN REDUCTION INTERNAL FIXATION (ORIF) ANKLE FRACTURE;  Surgeon: Newt Minion, MD;  Location: [facility];  Service: Orthopedics;  Laterality: Left;  . SAVORY DILATION N/A 10/09/2014   Procedure: SAVORY DILATION;  Surgeon: Inda Castle, MD;  Location: [facility];  Service: Endoscopy;  Laterality: N/A;  . TONSILLECTOMY     Social History   Social History Narrative   Patient lives at home alone.   Caffeine Use: 2 cups daily     Objective: Vital Signs: BP 136/64 (BP Location: Left Arm, Patient Position: Sitting, Cuff Size: Large)   Pulse (!) 102   Resp 16   Ht 5' 5.5" (1.664 m)   Wt 165 lb (74.8 kg)   BMI 27.04 kg/m    Physical Exam   Constitutional: She is oriented to person, place, and time. She appears well-developed and well-nourished.  HENT:  Head: Normocephalic and atraumatic.  Eyes: Conjunctivae and EOM are normal.  Neck: Normal range of motion.  Cardiovascular: Normal rate, regular rhythm, normal heart sounds and intact distal pulses.   Pulmonary/Chest: Effort normal and breath sounds normal.  Abdominal: Soft. Bowel sounds are normal.  Lymphadenopathy:    She has no cervical adenopathy.  Neurological: She is alert and oriented to person, place, and time.  Skin: Skin is warm and dry. Capillary refill takes less than 2 seconds.  Dry skin noted on bilateral lower extremities she has some areas where she has psoriasis and it is resolved almost.  Psychiatric: She has a normal mood and affect. Her behavior is normal.  Nursing note and vitals reviewed.    Musculoskeletal Exam: C-spine good range of motion she has some kyphosis lumbar spine good range of motion. No SI joint tenderness. She has good range of motion of her shoulders, elbows, wrist joints, MCPs, PIPs and DIPs with no synovitis she has bilateral CMC arthritis with right CMC subluxation. She is good range of motion of her hip joints knee joints ankles ankle joints are limited especially on the right ankle. She is warmth on palpation of her left knee and slight warmth on palpation of her right ankle. She she had right foot reconstruction by Dr.Duda and is healing gradually.  CDAI Exam: No CDAI exam completed.    Investigation: Findings:  September 2014 TB: Negative hepatitis negative, 06/02/2016 uric acid 4.7, 06/28/2016 CMP showed glucose 347, GFR 57, CBC normal    Imaging: No results found.  Speciality Comments: No specialty comments available.    Procedures:  No procedures performed Allergies: Carboplatin; Remicade [infliximab]; and Hydromorphone   Assessment / Plan: Visit Diagnoses:  Psoriasis: She had mild flare recently which is  improving. She states she has had 2 flares in the last 6 months of both lower extremities. I will give her topical cream clobetasol which can be used prn. Side effects were discussed.  Psoriatic arthropathy ([place]): She does not have any synovitis on examination today. She has some warmth and discomfort in her left knee which could be due to end-stage arthritis. She states she had x-rays by Dr. Sharol Given recently. She also had cortisone injection in her knee which was helpful. We had detailed discussion regarding the use of sulfasalazine. I do not think it's necessary to start at this point patient does not have much inflammation on the examination. But in case she has more problems in future that would be a good choice. I will need approval to use sulfasalazine in future  from Dr. Earlie Server in case her arthritis  or psoriasis gets worse. She plans to discuss that with Dr.Mohammed next visit as well.  Right foot pain and neuropathy, she states some she had reconstruction surgery. She is gradually recovering from that.  Cancer of middle lobe of lung ([place]) she's been followed by Dr. Earlie Server .  Essential hypertension: Appears to be well controlled.  Type 2 diabetes mellitus : Followed by PCP  Other diabetic neurological complication associated with type 2 diabetes mellitus ([place])  : She has neuropathy in her bilateral upper extremities more prominent in the left hand, uncertain if it's due to diabetic neuropathy or use of immunotherapy.     Face-to-face time spent with patient was 30 minutes. 50% of time was spent in counseling and coordination of care.  Follow-Up Instructions: Return in about 6 months (around 03/09/2017) for Psoriatic arthritis.      Bo Merino, MD, [facility]

## 2016-09-13 ENCOUNTER — Other Ambulatory Visit: Payer: Self-pay | Admitting: Neurology

## 2016-09-13 ENCOUNTER — Telehealth: Payer: Self-pay | Admitting: Radiology

## 2016-09-13 ENCOUNTER — Encounter (HOSPITAL_COMMUNITY): Payer: Self-pay | Admitting: *Deleted

## 2016-09-13 DIAGNOSIS — D849 Immunodeficiency, unspecified: Secondary | ICD-10-CM

## 2016-09-13 DIAGNOSIS — D899 Disorder involving the immune mechanism, unspecified: Principal | ICD-10-CM

## 2016-09-13 NOTE — Telephone Encounter (Signed)
Placed order for the G6PD

## 2016-09-13 NOTE — Telephone Encounter (Signed)
-----   Message from Bo Merino, MD sent at 09/13/2016 12:54 PM EST ----- Patient is having some right knee joint pain and discomfort last visit. I discussed use of sulfasalazine in future if she wants it. According to Dr. Lew Dawes notes we can start her on sulfasalazine as needed. If she is willing to do that I may need G6PD if it's not in chart and baseline labs before starting on sulfasalazine. We will also need to give her a  and get consent Thanks    .

## 2016-09-13 NOTE — Telephone Encounter (Signed)
There is not a G6PD in her chart

## 2016-09-14 ENCOUNTER — Other Ambulatory Visit: Payer: Self-pay | Admitting: Radiology

## 2016-09-14 DIAGNOSIS — D899 Disorder involving the immune mechanism, unspecified: Principal | ICD-10-CM

## 2016-09-14 DIAGNOSIS — D849 Immunodeficiency, unspecified: Secondary | ICD-10-CM

## 2016-09-14 NOTE — Telephone Encounter (Signed)
I spoke to patient/ agreed to send her the information in [place], and have her sign consent when she is here for the labs today. Her consent form, handout is on my desk

## 2016-09-15 ENCOUNTER — Encounter: Payer: Self-pay | Admitting: Internal Medicine

## 2016-09-15 ENCOUNTER — Other Ambulatory Visit: Payer: Self-pay | Admitting: Radiology

## 2016-09-15 ENCOUNTER — Telehealth: Payer: Self-pay | Admitting: Radiology

## 2016-09-15 DIAGNOSIS — Z79899 Other long term (current) drug therapy: Secondary | ICD-10-CM

## 2016-09-15 LAB — GLUCOSE 6 PHOSPHATE DEHYDROGENASE: G-6PDH: 13 U/g Hgb (ref 4.6–13.5)

## 2016-09-15 MED ORDER — SULFASALAZINE 500 MG PO TBEC
1000.0000 mg | DELAYED_RELEASE_TABLET | Freq: Two times a day (BID) | ORAL | 2 refills | Status: DC
Start: 2016-09-15 — End: 2016-12-12

## 2016-09-15 NOTE — Telephone Encounter (Signed)
My chart message and labs placed

## 2016-09-16 NOTE — Progress Notes (Signed)
Spoke with diabetic coordinator , Modena Nunnery and told her what I had instructed patient to do regarding Insulin prior to EGD on 09/19/16.  I had instructed patient to  Take no bedtime dsoe nite before procedure for humalog Insulin, Levemir Insulin- pm dose day before procedure since patient is TYPE I diabetic to take 80% of dose which is 16 units and if glucose if > 220 am of proedure to take 1/2 of correction dose of Insulin.  Modena Nunnery , Diabetic Coordinator verifed this were the correct instructions.

## 2016-09-19 ENCOUNTER — Ambulatory Visit (HOSPITAL_COMMUNITY): Payer: Medicare Other | Admitting: Anesthesiology

## 2016-09-19 ENCOUNTER — Ambulatory Visit (HOSPITAL_COMMUNITY)
Admission: RE | Admit: 2016-09-19 | Discharge: 2016-09-19 | Disposition: A | Discharge status: Home / Self Care | Payer: Medicare Other | Source: Ambulatory Visit | Attending: Gastroenterology | Admitting: Gastroenterology

## 2016-09-19 ENCOUNTER — Encounter (HOSPITAL_COMMUNITY)
Admission: RE | Disposition: A | Discharge status: Home / Self Care | Payer: Self-pay | Source: Ambulatory Visit | Attending: Gastroenterology

## 2016-09-19 ENCOUNTER — Ambulatory Visit (HOSPITAL_COMMUNITY): Payer: Medicare Other

## 2016-09-19 ENCOUNTER — Encounter (HOSPITAL_COMMUNITY): Payer: Self-pay

## 2016-09-19 DIAGNOSIS — I739 Peripheral vascular disease, unspecified: Secondary | ICD-10-CM | POA: Diagnosis not present

## 2016-09-19 DIAGNOSIS — R1310 Dysphagia, unspecified: Secondary | ICD-10-CM | POA: Diagnosis not present

## 2016-09-19 DIAGNOSIS — Z90710 Acquired absence of both cervix and uterus: Secondary | ICD-10-CM | POA: Insufficient documentation

## 2016-09-19 DIAGNOSIS — Z79899 Other long term (current) drug therapy: Secondary | ICD-10-CM | POA: Insufficient documentation

## 2016-09-19 DIAGNOSIS — R0600 Dyspnea, unspecified: Secondary | ICD-10-CM | POA: Diagnosis not present

## 2016-09-19 DIAGNOSIS — M069 Rheumatoid arthritis, unspecified: Secondary | ICD-10-CM | POA: Insufficient documentation

## 2016-09-19 DIAGNOSIS — Z923 Personal history of irradiation: Secondary | ICD-10-CM | POA: Insufficient documentation

## 2016-09-19 DIAGNOSIS — K449 Diaphragmatic hernia without obstruction or gangrene: Secondary | ICD-10-CM | POA: Insufficient documentation

## 2016-09-19 DIAGNOSIS — J449 Chronic obstructive pulmonary disease, unspecified: Secondary | ICD-10-CM | POA: Diagnosis not present

## 2016-09-19 DIAGNOSIS — Z85118 Personal history of other malignant neoplasm of bronchus and lung: Secondary | ICD-10-CM | POA: Insufficient documentation

## 2016-09-19 DIAGNOSIS — K529 Noninfective gastroenteritis and colitis, unspecified: Secondary | ICD-10-CM

## 2016-09-19 DIAGNOSIS — Z794 Long term (current) use of insulin: Secondary | ICD-10-CM | POA: Diagnosis not present

## 2016-09-19 DIAGNOSIS — Z9049 Acquired absence of other specified parts of digestive tract: Secondary | ICD-10-CM | POA: Insufficient documentation

## 2016-09-19 DIAGNOSIS — E1040 Type 1 diabetes mellitus with diabetic neuropathy, unspecified: Secondary | ICD-10-CM | POA: Diagnosis not present

## 2016-09-19 DIAGNOSIS — K228 Other specified diseases of esophagus: Secondary | ICD-10-CM

## 2016-09-19 DIAGNOSIS — K2289 Other specified disease of esophagus: Secondary | ICD-10-CM

## 2016-09-19 DIAGNOSIS — L409 Psoriasis, unspecified: Secondary | ICD-10-CM | POA: Diagnosis not present

## 2016-09-19 DIAGNOSIS — K222 Esophageal obstruction: Secondary | ICD-10-CM | POA: Insufficient documentation

## 2016-09-19 DIAGNOSIS — K219 Gastro-esophageal reflux disease without esophagitis: Secondary | ICD-10-CM | POA: Insufficient documentation

## 2016-09-19 DIAGNOSIS — I10 Essential (primary) hypertension: Secondary | ICD-10-CM | POA: Insufficient documentation

## 2016-09-19 DIAGNOSIS — Z888 Allergy status to other drugs, medicaments and biological substances status: Secondary | ICD-10-CM | POA: Insufficient documentation

## 2016-09-19 HISTORY — PX: ESOPHAGOGASTRODUODENOSCOPY (EGD) WITH PROPOFOL: SHX5813

## 2016-09-19 HISTORY — PX: SAVORY DILATION: SHX5439

## 2016-09-19 LAB — GLUCOSE, CAPILLARY
Glucose-Capillary: 346 mg/dL — ABNORMAL HIGH (ref 65–99)
Glucose-Capillary: 294 mg/dL — ABNORMAL HIGH (ref 65–99)

## 2016-09-19 SURGERY — ESOPHAGOGASTRODUODENOSCOPY (EGD) WITH PROPOFOL
Anesthesia: Monitor Anesthesia Care

## 2016-09-19 MED ORDER — INSULIN ASPART 100 UNIT/ML ~~LOC~~ SOLN
4.0000 [IU] | SUBCUTANEOUS | Status: AC
  Administered 2016-09-19: 4 [IU] via SUBCUTANEOUS
  Filled 2016-09-19: qty 0.04

## 2016-09-19 MED ORDER — SODIUM CHLORIDE 0.9 % IV SOLN: INTRAVENOUS | Status: DC

## 2016-09-19 MED ORDER — LACTATED RINGERS IV SOLN
INTRAVENOUS | Status: DC
  Administered 2016-09-19: 09:00:00 via INTRAVENOUS

## 2016-09-19 MED ORDER — PROPOFOL 500 MG/50ML IV EMUL
INTRAVENOUS | Status: DC | PRN
  Administered 2016-09-19: 100 ug/kg/min via INTRAVENOUS

## 2016-09-19 MED ORDER — ONDANSETRON HCL 4 MG/2ML IJ SOLN
INTRAMUSCULAR | Status: AC
  Filled 2016-09-19: qty 2

## 2016-09-19 MED ORDER — PROPOFOL 500 MG/50ML IV EMUL
INTRAVENOUS | Status: DC | PRN
  Administered 2016-09-19: 40 mg via INTRAVENOUS

## 2016-09-19 MED ORDER — INSULIN ASPART 100 UNIT/ML ~~LOC~~ SOLN
6.0000 [IU] | Freq: Once | SUBCUTANEOUS | Status: AC
  Administered 2016-09-19: 6 [IU] via SUBCUTANEOUS
  Filled 2016-09-19: qty 0.06

## 2016-09-19 MED ORDER — INSULIN ASPART 100 UNIT/ML ~~LOC~~ SOLN
6.0000 [IU] | Freq: Once | SUBCUTANEOUS | Status: DC
  Filled 2016-09-19: qty 0.06

## 2016-09-19 MED ORDER — PROPOFOL 10 MG/ML IV BOLUS
INTRAVENOUS | Status: AC
  Filled 2016-09-19: qty 20

## 2016-09-19 SURGICAL SUPPLY — 15 items

## 2016-09-19 NOTE — Interval H&P Note (Signed)
History and Physical Interval Note:  09/19/2016 8:45 AM  Megan [name]  has presented today for surgery, with the diagnosis of dysphagia-radiation induced stricture  The various methods of treatment have been discussed with the patient and family. After consideration of risks, benefits and other options for treatment, the patient has consented to  Procedure(s) with comments: ESOPHAGOGASTRODUODENOSCOPY (EGD) WITH PROPOFOL (N/A) - with savary dil tt SAVORY DILATION (N/A) as a surgical intervention .  The patient's history has been reviewed, patient examined, no change in status, stable for surgery.  I have reviewed the patient's chart and labs.  Questions were answered to the patient's satisfaction.     Nelida Meuse III

## 2016-09-19 NOTE — H&P (Signed)
History:  This patient presents for endoscopic testing for dysphagia.  [place] Artist Referring physician: Binnie Rail, MD  Past Medical History: Past Medical History:  Diagnosis Date  . Cancer ([place]) dx'd 09/2013   Lung ca  . Diabetes ([place]) 09/08/2016   type I diabetic patient reported on 09/13/16   . Diabetes mellitus    insulin  . Diabetic neuropathy ([place]) 09/08/2016  . GERD (gastroesophageal reflux disease)    no meds for  . History of hiatal hernia   . Hx of radiation therapy 12/16/13-01/30/14   lung 66Gy  . Hypertension   . Malignant neoplasm of right upper lobe of lung ([place]) 11/28/2013  . Neuropathy ([place])   . Psoriasis   . Psoriatic arthritis ([place]) 09/08/2016  . Rheumatoid arthritis ([place])   . Shortness of breath dyspnea    with exertion     Past Surgical History: Past Surgical History:  Procedure Laterality Date  . ABDOMINAL HYSTERECTOMY    . ABDOMINAL SURGERY    . BALLOON DILATION N/A 12/12/2014   Procedure: BALLOON DILATION;  Surgeon: Inda Castle, MD;  Location: [facility];  Service: Endoscopy;  Laterality: N/A;  . CHOLECYSTECTOMY    . COLONOSCOPY N/A 02/07/2016   Procedure: COLONOSCOPY;  Surgeon: Doran Stabler, MD;  Location: [facility] END[facility]COPY;  Service: Endoscopy;  Laterality: N/A;  . DILATION AND CURETTAGE OF UTERUS    . ESOPHAGOGASTRODUODENOSCOPY N/A 12/12/2014   Procedure: ESOPHAGOGASTRODUODENOSCOPY (EGD);  Surgeon: Inda Castle, MD;  Location: [facility];  Service: Endoscopy;  Laterality: N/A;  with dilation  . ESOPHAGOGASTRODUODENOSCOPY (EGD) WITH PROPOFOL N/A 10/09/2014   Procedure: ESOPHAGOGASTRODUODENOSCOPY (EGD) WITH PROPOFOL;  Surgeon: Inda Castle, MD;  Location: [place];  Service: Endoscopy;  Laterality: N/A;  . FRACTURE SURGERY    . ORIF ANKLE FRACTURE Right 07/04/2015   Procedure: OPEN REDUCTION INTERNAL FIXATION (ORIF) ANKLE FRACTURE;  Surgeon: Newt Minion, MD;  Location: [place];  Service: Orthopedics;  Laterality: Right;  OPEN  REDUCTION, INTERNAL FIXATION OF RIGHT ANKLE FRACTURE.   [name] ORIF ANKLE FRACTURE Left 02/10/2016   Procedure: OPEN REDUCTION INTERNAL FIXATION (ORIF) ANKLE FRACTURE;  Surgeon: Newt Minion, MD;  Location: [place];  Service: Orthopedics;  Laterality: Left;  . SAVORY DILATION N/A 10/09/2014   Procedure: SAVORY DILATION;  Surgeon: Inda Castle, MD;  Location: [place];  Service: Endoscopy;  Laterality: N/A;  . TONSILLECTOMY      Allergies: Allergies  Allergen Reactions  . Carboplatin Other (See Comments)    Neuropathy  . Remicade [Infliximab] Anaphylaxis  . Hydromorphone Rash    Outpatient Meds: Current Facility-Administered Medications  Medication Dose Route Frequency Provider Last Rate Last Dose  . 0.9 %  sodium chloride infusion   Intravenous Continuous Nelida Meuse III, MD      . insulin aspart (novoLOG) injection 6 Units  6 Units Subcutaneous Once Rica Koyanagi, MD      . lactated ringers infusion   Intravenous Continuous Doran Stabler, MD       Facility-Administered Medications Ordered in Other Encounters  Medication Dose Route Frequency Provider Last Rate Last Dose  . sodium chloride 0.9 % injection 10 mL  10 mL Intracatheter PRN Curt Bears, MD          ___________________________________________________________________ Objective   Exam:  BP 135/84   Pulse 98   Temp 97.9 F (36.6 C) (Oral)   Resp 14   Ht 5' 5.5" (1.664 m)   Wt 165 lb (74.8  kg)   SpO2 95%   BMI 27.04 kg/m    CV: RRR without murmur, S1/S2, no JVD, no peripheral edema  Resp: clear to auscultation bilaterally, normal RR and effort noted  GI: soft, no tenderness, with active bowel sounds. No guarding or palpable organomegaly noted.  Neuro: awake, alert and oriented x 3. Normal gross motor function and fluent speech   Assessment:  dysphagia  Plan:  egd with dilation   Nelida Meuse III

## 2016-09-19 NOTE — Transfer of Care (Signed)
Immediate Anesthesia Transfer of Care Note  Patient: CLEONE HULICK  Procedure(s) Performed: Procedure(s) with comments: ESOPHAGOGASTRODUODENOSCOPY (EGD) WITH PROPOFOL (N/A) - with savary dil tt SAVORY DILATION (N/A)  Patient Location: PACU  Anesthesia Type:MAC  Level of Consciousness:  sedated, patient cooperative and responds to stimulation  Airway & Oxygen Therapy:Patient Spontanous Breathing and Patient connected to face mask oxgen  Post-op Assessment:  Report given to PACU RN and Post -op Vital signs reviewed and stable  Post vital signs:  Reviewed and stable  Last Vitals:  Vitals:   09/19/16 0826  BP: 135/84  Pulse: 98  Resp: 14  Temp: 11.6 C    Complications: No apparent anesthesia complications

## 2016-09-19 NOTE — Anesthesia Preprocedure Evaluation (Signed)
Anesthesia Evaluation  Patient identified by MRN, date of birth, ID band Patient awake    Airway Mallampati: II  TM Distance: >3 FB Neck ROM: Full    Dental  (+) Teeth Intact, Dental Advisory Given   Pulmonary shortness of breath, COPD, former smoker,    breath sounds clear to auscultation       Cardiovascular hypertension, + Peripheral Vascular Disease   Rhythm:Regular Rate:Normal     Neuro/Psych  Neuromuscular disease    GI/Hepatic hiatal hernia, GERD  ,  Endo/Other  diabetes  Renal/GU Renal disease     Musculoskeletal  (+) Arthritis ,   Abdominal   Peds  Hematology   Anesthesia Other Findings   Reproductive/Obstetrics                             Anesthesia Physical Anesthesia Plan  ASA: III  Anesthesia Plan: MAC   Post-op Pain Management:    Induction: Intravenous  Airway Management Planned: Nasal Cannula and Natural Airway  Additional Equipment:   Intra-op Plan:   Post-operative Plan: Extubation in OR  Informed Consent: I have reviewed the patients History and Physical, chart, labs and discussed the procedure including the risks, benefits and alternatives for the proposed anesthesia with the patient or authorized representative who has indicated his/her understanding and acceptance.     Plan Discussed with:   Anesthesia Plan Comments:         Anesthesia Quick Evaluation

## 2016-09-19 NOTE — Op Note (Signed)
[facility] Patient Name: Megan [name] Procedure Date: 09/19/2016 MRN: [id number] Attending MD: Estill Cotta. Loletha Carrow , MD Date of Birth: 29-Jun-1941 CSN: [id number] Age: 75 Admit Type: Outpatient Procedure:                Upper GI endoscopy Indications:              Dysphagia, For therapy of esophageal stenosis Providers:                Mallie Mussel L. Loletha Carrow, MD, Hilma Favors, RN, Alfonso Patten,                            Technician, Arnoldo Hooker, CRNA Referring MD:              Medicines:                Monitored Anesthesia Care Complications:            No immediate complications. Estimated Blood Loss:     Estimated blood loss: none. Procedure:                Pre-Anesthesia Assessment:                           - Prior to the procedure, a History and Physical                            was performed, and patient medications and                            allergies were reviewed. The patient's tolerance of                            previous anesthesia was also reviewed. The risks                            and benefits of the procedure and the sedation                            options and risks were discussed with the patient.                            All questions were answered, and informed consent                            was obtained. Prior Anticoagulants: The patient has                            taken no previous anticoagulant or antiplatelet                            agents. ASA Grade Assessment: III - A patient with                            severe systemic disease. After reviewing the risks  and benefits, the patient was deemed in                            satisfactory condition to undergo the procedure.                           After obtaining informed consent, the endoscope was                            passed under direct vision. Throughout the                            procedure, the patient's blood pressure, pulse, and                      oxygen saturations were monitored continuously. The                            EG-[id number] ([id number]) scope was introduced through the                            mouth, and advanced to the second part of duodenum.                            The upper GI endoscopy was accomplished without                            difficulty. The patient tolerated the procedure                            well. Scope In: Scope Out: Findings:      One moderate benign-appearing (radiation-induced), intrinsic stenosis       was found 25 to 30 cm from the incisors. This measured 1 cm (inner       diameter) x 5 cm (in length) and was traversed. A guidewire was placed       under fluoroscopic guidance and the scope was withdrawn. Dilation was       performed with a Savary dilator with mild resistance at 12 mm and       moderate resistance at 14 mm. Repeat esophagoscopy revelaed a good       result.      The cardia and gastric fundus were normal on retroflexion.      The stomach was normal.      The examined duodenum was normal. Impression:               - Benign-appearing esophageal stenosis. Unable to                            dilate.                           - Normal stomach.                           - Normal examined duodenum.                           -  No specimens collected. Moderate Sedation:      MAC sedation used Recommendation:           - Patient has a contact number available for                            emergencies. The signs and symptoms of potential                            delayed complications were discussed with the                            patient. Return to normal activities tomorrow.                            Written discharge instructions were provided to the                            patient.                           - Resume previous diet.                           - Continue present medications. Procedure Code(s):        --- Professional ---                            [phone], Esophagogastroduodenoscopy, flexible,                            transoral; with insertion of guide wire followed by                            passage of dilator(s) through esophagus over guide                            wire Diagnosis Code(s):        --- Professional ---                           K22.2, Esophageal obstruction                           R13.10, Dysphagia, unspecified CPT copyright 2016 American Medical Association. All rights reserved. The codes documented in this report are preliminary and upon coder review may  be revised to meet current compliance requirements. [name] L. Loletha Carrow, MD 09/19/2016 9:53:01 AM This report has been signed electronically. Number of Addenda: 0

## 2016-09-19 NOTE — Anesthesia Postprocedure Evaluation (Signed)
Anesthesia Post Note  Patient: Megan [name]  Procedure(s) Performed: Procedure(s) (LRB): ESOPHAGOGASTRODUODENOSCOPY (EGD) WITH PROPOFOL (N/A) SAVORY DILATION (N/A)  Patient location during evaluation: Endoscopy Anesthesia Type: MAC Level of consciousness: awake and alert Pain management: pain level controlled Vital Signs Assessment: post-procedure vital signs reviewed and stable Respiratory status: spontaneous breathing, nonlabored ventilation, respiratory function stable and patient connected to nasal cannula oxygen Cardiovascular status: stable and blood pressure returned to baseline Anesthetic complications: no    Last Vitals:  Vitals:   09/19/16 0826 09/19/16 0942  BP: 135/84 (!) 153/87  Pulse: 98 91  Resp: 14 16  Temp: 36.6 C 36.3 C    Last Pain:  Vitals:   09/19/16 0942  TempSrc: Oral                 Jaelynne Hockley,JAMES TERRILL

## 2016-09-19 NOTE — Discharge Instructions (Signed)
YOU HAD AN ENDOSCOPIC PROCEDURE TODAY: Refer to the procedure report and other information in the discharge instructions given to you for any specific questions about what was found during the examination. If this information does not answer your questions, please call [place] office at [phone] to clarify.   YOU SHOULD EXPECT: Some feelings of bloating in the abdomen. Passage of more gas than usual. Walking can help get rid of the air that was put into your GI tract during the procedure and reduce the bloating. If you had a lower endoscopy (such as a colonoscopy or flexible sigmoidoscopy) you may notice spotting of blood in your stool or on the toilet paper. Some abdominal soreness may be present for a day or two, also.  DIET: Your first meal following the procedure should be a light meal and then it is ok to progress to your normal diet. A half-sandwich or bowl of soup is an example of a good first meal. Heavy or fried foods are harder to digest and may make you feel nauseous or bloated. Drink plenty of fluids but you should avoid alcoholic beverages for 24 hours. If you had a esophageal dilation, please see attached instructions for diet.    ACTIVITY: Your care partner should take you home directly after the procedure. You should plan to take it easy, moving slowly for the rest of the day. You can resume normal activity the day after the procedure however YOU SHOULD NOT DRIVE, use power tools, machinery or perform tasks that involve climbing or major physical exertion for 24 hours (because of the sedation medicines used during the test).   SYMPTOMS TO REPORT IMMEDIATELY: A gastroenterologist can be reached at any hour. Please call [phone]  for any of the following symptoms:   Following upper endoscopy (EGD, EUS, ERCP, esophageal dilation) Vomiting of blood or coffee ground material  New, significant abdominal pain  New, significant chest pain or pain under the shoulder blades  Painful or  persistently difficult swallowing  New shortness of breath  Black, tarry-looking or red, bloody stools  FOLLOW UP:  If any biopsies were taken you will be contacted by phone or by letter within the next 1-3 weeks. Call [phone]  if you have not heard about the biopsies in 3 weeks.  Please also call with any specific questions about appointments or follow up tests.  

## 2016-09-20 ENCOUNTER — Encounter (HOSPITAL_COMMUNITY): Payer: Self-pay | Admitting: Gastroenterology

## 2016-09-26 ENCOUNTER — Ambulatory Visit: Payer: Medicare Other | Admitting: Neurology

## 2016-09-27 ENCOUNTER — Ambulatory Visit (INDEPENDENT_AMBULATORY_CARE_PROVIDER_SITE_OTHER): Payer: Medicare Other | Admitting: Orthopedic Surgery

## 2016-09-27 VITALS — Ht 65.0 in | Wt 165.0 lb

## 2016-09-27 DIAGNOSIS — R2241 Localized swelling, mass and lump, right lower limb: Secondary | ICD-10-CM

## 2016-09-27 NOTE — Progress Notes (Signed)
Office Visit Note   Patient: Megan [name]           Date of Birth: 06/26/1941           MRN: [id number] Visit Date: 09/27/2016              Requested by: Binnie Rail, MD [place], [place] [id number] PCP: Binnie Rail, MD   Assessment & Plan: Visit Diagnoses:  1. Mass of foot, right     Plan: Due to pain with shoewear pain with activities of daily living patient states she would like to have the soft tissue mass excised. This essentially medially over the base of the first metatarsal. We will set this up at her convenience outpatient surgery following the office 2 weeks postoperatively. Risk and benefits were discussed including infection neurovascular injury persistent pain and recurrence of the mass. Patient states she understands wish proceed at this time. Previous radiographs showed a fracture through the base of the first metatarsal and most likely this is reactive bone to this fracture.  Follow-Up Instructions: Return in about 2 weeks (around 10/11/2016).   Orders:  No orders of the defined types were placed in this encounter.  No orders of the defined types were placed in this encounter.     Procedures: No procedures performed   Clinical Data: No additional findings.   Subjective: Chief Complaint  Patient presents with  . Right Foot - Pain    07/26/16 right fusion base of the 1st MT and lisfranc joint right chevron osteotomy       S/p surgery on 07/26/16 right fusion base of the 1st MT and lisfranc joint right chevron osteotomy .Patient states that she is having pain at incision from bunion, She states that a hard knot has developed since surgery and that she thinks this maybe scar tissue. She does have tenderness with shoe wear and that it is getting more uncmofortable.     Review of Systems   Objective: Vital Signs: Ht '5\' 5"'$  (1.651 m)   Wt 165 lb (74.8 kg)   BMI 27.46 kg/m   Physical Exam on examination patient is alert oriented no  adenopathy well-dressed normal affect normal respiratory effort she does have venous stasis swelling in her right leg. She has a strong dorsalis pedis pulse. Review the radiographs shows a previous fracture through the base of first metatarsal with some bony prominence medially. This is exactly where she is tender with the bony mass prominent medially. There is no cellulitis no skin color changes no signs of infection. The mass is not cystic  Ortho Exam  Specialty Comments:  No specialty comments available.  Imaging: No results found.   PMFS History: Patient Active Problem List   Diagnosis Date Noted  . Psoriatic arthropathy ([place]) 09/08/2016  . Psoriatic arthritis ([place]) 09/08/2016  . Hypertension 09/08/2016  . Diabetic neuropathy ([place]) 09/08/2016  . Diabetes ([place]) 09/08/2016  . LADA (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), managed as type 1 ([place]) 05/04/2016  . Syncope 03/25/2016  . Malnutrition of moderate degree 02/29/2016  . Skin breakdown 02/26/2016  . Leukocytosis 02/17/2016  . Diabetic ketoacidosis with coma associated with diabetes mellitus due to underlying condition ([place])   . Abnormal finding on GI tract imaging   . Hematochezia   . Acute kidney injury ([place]) 02/04/2016  . Acute colitis 02/04/2016  . RLS (restless legs syndrome) 12/11/2015  . Diarrhea due to drug 08/11/2015  . Liver dysfunction 08/11/2015  . Lumbago  05/06/2015  . Hot flashes 04/14/2015  . Peripheral edema 04/14/2015  . Rheumatoid arthritis ([place]) 04/14/2015  . Encounter for antineoplastic immunotherapy 04/06/2015  . Emphysema of lung ([place]) 11/26/2014  . Radiation-induced esophageal stricture 10/01/2014  . Tachycardia 08/27/2014  . Depression 08/27/2014  . Palliative care encounter 08/21/2014  . DNR (do not resuscitate) discussion 08/21/2014  . Neuropathic pain 08/21/2014  . Cancer associated pain 07/29/2014  . Malignant cachexia ([place]) 07/29/2014  . Lung collapse 07/29/2014  . Fatigue 07/18/2014  .  Neuropathy due to chemotherapeutic drug ([place]) 07/18/2014  . Leg cramps 07/18/2014  . Hypoalbuminemia 07/18/2014  . Thrombocytopenia, unspecified 07/18/2014  . Dyspnea 04/28/2014  . Radiation esophagitis 04/10/2014  . Right-sided chest wall pain 04/01/2014  . Cancer of middle lobe of lung ([place]) 01/17/2014  . Malignant neoplasm of right upper lobe of lung ([place]) 11/28/2013  . Lung mass 11/09/2013   Past Medical History:  Diagnosis Date  . Cancer ([place]) dx'd 09/2013   Lung ca  . Diabetes ([place]) 09/08/2016   type I diabetic patient reported on 09/13/16   . Diabetes mellitus    insulin  . Diabetic neuropathy ([place]) 09/08/2016  . GERD (gastroesophageal reflux disease)    no meds for  . History of hiatal hernia   . Hx of radiation therapy 12/16/13-01/30/14   lung 66Gy  . Hypertension   . Malignant neoplasm of right upper lobe of lung ([place]) 11/28/2013  . Neuropathy ([place])   . Psoriasis   . Psoriatic arthritis ([place]) 09/08/2016  . Rheumatoid arthritis ([place])   . Shortness of breath dyspnea    with exertion    Family History  Problem Relation Age of Onset  . Other Mother   . Other Father     failure to thrive  . Hypertension Other   . Stroke Other   . Heart attack Other   . Hemachromatosis Other   . Rheum arthritis      both sets of grandparents and father    Past Surgical History:  Procedure Laterality Date  . ABDOMINAL HYSTERECTOMY    . ABDOMINAL SURGERY    . BALLOON DILATION N/A 12/12/2014   Procedure: BALLOON DILATION;  Surgeon: Inda Castle, MD;  Location: [facility];  Service: Endoscopy;  Laterality: N/A;  . CHOLECYSTECTOMY    . COLONOSCOPY N/A 02/07/2016   Procedure: COLONOSCOPY;  Surgeon: Doran Stabler, MD;  Location: [facility] ENDOSCOPY;  Service: Endoscopy;  Laterality: N/A;  . DILATION AND CURETTAGE OF UTERUS    . ESOPHAGOGASTRODUODENOSCOPY N/A 12/12/2014   Procedure: ESOPHAGOGASTRODUODENOSCOPY (EGD);  Surgeon: Inda Castle, MD;  Location: [facility];  Service:  Endoscopy;  Laterality: N/A;  with dilation  . ESOPHAGOGASTRODUODENOSCOPY (EGD) WITH PROPOFOL N/A 10/09/2014   Procedure: ESOPHAGOGASTRODUODENOSCOPY (EGD) WITH PROPOFOL;  Surgeon: Inda Castle, MD;  Location: [place];  Service: Endoscopy;  Laterality: N/A;  . ESOPHAGOGASTRODUODENOSCOPY (EGD) WITH PROPOFOL N/A 09/19/2016   Procedure: ESOPHAGOGASTRODUODENOSCOPY (EGD) WITH PROPOFOL;  Surgeon: Doran Stabler, MD;  Location: [facility];  Service: Gastroenterology;  Laterality: N/A;  with savary dil tt  . FRACTURE SURGERY    . ORIF ANKLE FRACTURE Right 07/04/2015   Procedure: OPEN REDUCTION INTERNAL FIXATION (ORIF) ANKLE FRACTURE;  Surgeon: Newt Minion, MD;  Location: [place];  Service: Orthopedics;  Laterality: Right;  OPEN REDUCTION, INTERNAL FIXATION OF RIGHT ANKLE FRACTURE.   [name] ORIF ANKLE FRACTURE Left 02/10/2016   Procedure: OPEN REDUCTION INTERNAL FIXATION (ORIF) ANKLE FRACTURE;  Surgeon: Newt Minion, MD;  Location: [place];  Service: Orthopedics;  Laterality: Left;  . SAVORY DILATION N/A 10/09/2014   Procedure: SAVORY DILATION;  Surgeon: Inda Castle, MD;  Location: [place];  Service: Endoscopy;  Laterality: N/A;  . SAVORY DILATION N/A 09/19/2016   Procedure: SAVORY DILATION;  Surgeon: Doran Stabler, MD;  Location: [facility];  Service: Gastroenterology;  Laterality: N/A;  . TONSILLECTOMY     Social History   Occupational History  . Retired Other   Social History Main Topics  . Smoking status: Former Smoker    Packs/day: 3.00    Years: 45.00    Types: Cigarettes    Quit date: 12/05/1997  . Smokeless tobacco: Never Used  . Alcohol use No  . Drug use: No  . Sexual activity: Not Currently

## 2016-09-29 ENCOUNTER — Telehealth: Payer: Self-pay | Admitting: Radiology

## 2016-09-29 NOTE — Telephone Encounter (Signed)
I have gotten notice patient did not read my chart notice regarding SSZ / I did give her the information when she came in for her lab work. She is aware of the need for labs in one month, then every three months. Her consent form was sent for scanning

## 2016-10-01 ENCOUNTER — Encounter (HOSPITAL_COMMUNITY): Payer: Self-pay | Admitting: Emergency Medicine

## 2016-10-01 ENCOUNTER — Emergency Department (HOSPITAL_COMMUNITY)
Admission: EM | Admit: 2016-10-01 | Discharge: 2016-10-01 | Disposition: A | Discharge status: Home / Self Care | Payer: Medicare Other | Attending: Emergency Medicine | Admitting: Emergency Medicine

## 2016-10-01 DIAGNOSIS — Z79899 Other long term (current) drug therapy: Secondary | ICD-10-CM | POA: Insufficient documentation

## 2016-10-01 DIAGNOSIS — I10 Essential (primary) hypertension: Secondary | ICD-10-CM | POA: Insufficient documentation

## 2016-10-01 DIAGNOSIS — K625 Hemorrhage of anus and rectum: Secondary | ICD-10-CM | POA: Insufficient documentation

## 2016-10-01 DIAGNOSIS — Z87891 Personal history of nicotine dependence: Secondary | ICD-10-CM | POA: Insufficient documentation

## 2016-10-01 DIAGNOSIS — R030 Elevated blood-pressure reading, without diagnosis of hypertension: Secondary | ICD-10-CM

## 2016-10-01 DIAGNOSIS — E118 Type 2 diabetes mellitus with unspecified complications: Secondary | ICD-10-CM

## 2016-10-01 DIAGNOSIS — K529 Noninfective gastroenteritis and colitis, unspecified: Secondary | ICD-10-CM | POA: Diagnosis not present

## 2016-10-01 DIAGNOSIS — E1043 Type 1 diabetes mellitus with diabetic autonomic (poly)neuropathy: Secondary | ICD-10-CM | POA: Diagnosis not present

## 2016-10-01 DIAGNOSIS — R739 Hyperglycemia, unspecified: Secondary | ICD-10-CM

## 2016-10-01 DIAGNOSIS — Z85118 Personal history of other malignant neoplasm of bronchus and lung: Secondary | ICD-10-CM | POA: Insufficient documentation

## 2016-10-01 DIAGNOSIS — I152 Hypertension secondary to endocrine disorders: Secondary | ICD-10-CM | POA: Diagnosis present

## 2016-10-01 LAB — COMPREHENSIVE METABOLIC PANEL
ALBUMIN: 3.5 g/dL (ref 3.5–5.0)
ALK PHOS: 329 U/L — AB (ref 38–126)
ALT: 62 U/L — ABNORMAL HIGH (ref 14–54)
ANION GAP: 11 (ref 5–15)
AST: 31 U/L (ref 15–41)
BUN: 10 mg/dL (ref 6–20)
CALCIUM: 9.1 mg/dL (ref 8.9–10.3)
CO2: 24 mmol/L (ref 22–32)
Chloride: 101 mmol/L (ref 101–111)
Creatinine, Ser: 0.74 mg/dL (ref 0.44–1.00)
GFR calc Af Amer: >60 mL/min (ref >60)
GFR calc non Af Amer: >60 mL/min (ref >60)
GLUCOSE: 356 mg/dL — AB (ref 65–99)
Potassium: 4.2 mmol/L (ref 3.5–5.1)
SODIUM: 136 mmol/L (ref 135–145)
Total Bilirubin: 0.4 mg/dL (ref 0.3–1.2)
Total Protein: 6.6 g/dL (ref 6.5–8.1)

## 2016-10-01 LAB — URINALYSIS, ROUTINE W REFLEX MICROSCOPIC
BILIRUBIN URINE: NEGATIVE
Glucose, UA: >1000 mg/dL — AB
HGB URINE DIPSTICK: NEGATIVE
Ketones, ur: 15 mg/dL — AB
Leukocytes, UA: NEGATIVE
Nitrite: NEGATIVE
PROTEIN: NEGATIVE mg/dL
Specific Gravity, Urine: 1.031 — ABNORMAL HIGH (ref 1.005–1.030)
pH: 6.5 (ref 5.0–8.0)

## 2016-10-01 LAB — CBC
HEMATOCRIT: 42.4 % (ref 36.0–46.0)
HEMOGLOBIN: 13.6 g/dL (ref 12.0–15.0)
MCH: 26.8 pg (ref 26.0–34.0)
MCHC: 32.1 g/dL (ref 30.0–36.0)
MCV: 83.6 fL (ref 78.0–100.0)
Platelets: 201 10*3/uL (ref 150–400)
RBC: 5.07 MIL/uL (ref 3.87–5.11)
RDW: 16.5 % — ABNORMAL HIGH (ref 11.5–15.5)
WBC: 8.2 10*3/uL (ref 4.0–10.5)

## 2016-10-01 LAB — URINE MICROSCOPIC-ADD ON

## 2016-10-01 LAB — CBG MONITORING, ED
Glucose-Capillary: 304 mg/dL — ABNORMAL HIGH (ref 65–99)
Glucose-Capillary: 259 mg/dL — ABNORMAL HIGH (ref 65–99)

## 2016-10-01 LAB — TYPE AND SCREEN
ABO/RH(D): O NEG
Antibody Screen: NEGATIVE

## 2016-10-01 LAB — POC OCCULT BLOOD, ED: FECAL OCCULT BLD: POSITIVE — AB

## 2016-10-01 MED ORDER — SODIUM CHLORIDE 0.9 % IV BOLUS (SEPSIS)
500.0000 mL | Freq: Once | INTRAVENOUS | Status: AC
  Administered 2016-10-01: 500 mL via INTRAVENOUS

## 2016-10-01 MED ORDER — LISINOPRIL 10 MG PO TABS: 10.0000 mg | ORAL_TABLET | Freq: Every day | ORAL | 0 refills | Status: DC

## 2016-10-01 MED ORDER — SODIUM CHLORIDE 0.9 % IV BOLUS (SEPSIS)
1000.0000 mL | Freq: Once | INTRAVENOUS | Status: AC
  Administered 2016-10-01: 1000 mL via INTRAVENOUS

## 2016-10-01 MED ORDER — SODIUM CHLORIDE 0.9 % IV BOLUS (SEPSIS): 1000.0000 mL | Freq: Once | INTRAVENOUS | Status: DC

## 2016-10-01 MED ORDER — PREDNISONE 20 MG PO TABS: 40.0000 mg | ORAL_TABLET | Freq: Every day | ORAL | 0 refills | Status: DC

## 2016-10-01 MED ORDER — INSULIN DETEMIR 100 UNIT/ML FLEXPEN: PEN_INJECTOR | SUBCUTANEOUS | 2 refills | Status: DC

## 2016-10-01 MED ORDER — INSULIN ASPART 100 UNIT/ML ~~LOC~~ SOLN
10.0000 [IU] | Freq: Once | SUBCUTANEOUS | Status: AC
  Administered 2016-10-01: 10 [IU] via SUBCUTANEOUS
  Filled 2016-10-01: qty 1

## 2016-10-01 NOTE — Discharge Instructions (Signed)
Please take the prednisone '40mg'$  once a day for your ulcerative colitis. Please take 10 units of your humalog before meals and according to your sliding scale as discussed with you by Dr. Nehemiah Settle. You also need to up your levamir to 30 units at bedtime. Please keep track of your blood sugars regularly and write them down in a book. You also need to follow up with [facility] on Monday. Please call them and schedule an appointment on Monday this is very important. You also need to schedule an appointment with your primary care doctor on Monday for blood pressure recheck this is very important. Return to the Ed if you develop worsening blood sugars, vomiting, chest pain, shortness of breath, vision changes or for any other reasons please have a low threshold to return to the Emergency department.

## 2016-10-01 NOTE — ED Triage Notes (Signed)
Pt. Stated, I've had rectal bleeding for a couple days

## 2016-10-01 NOTE — Consult Note (Signed)
Consult Note  Megan [name] UXL:[id number] DOB: [birth year] DOA: 10/01/2016  Referring physician: Dr Rex Kras, ED physician PCP: Binnie Rail, MD   Reason for Consult: Assist with insulin management of the patient.  HPI: Megan [name] is a 75 y.o. female with a history of insulin-dependent diabetes, diabetic neuropathy, GERD, hiatal hernia, hypertension, ulcerative colitis, history of lung cancer. Patient presented with bloody diarrhea 4-5 episodes since 3 AM this morning. She denies abdominal pain. The ER consulted GI, who believes she has ulcerative colitis flare and recommended outpatient follow-up for the patient. I was consulted to albuterol and her blood sugars, specially because her blood sugars been poorly controlled recently and she has had DKA in the past. The patient reports her blood sugars being in the 2-300 range, although was 500 this morning. She reports that this does not happen often. She is on a carb counting regimen (1 unit for every 7 g of carbs) with sliding scale on top of that. On average she takes about 7-9 units of insulin pre-meal. She checks her blood sugars before meals and daily at bedtime. Additionally, she takes Levemir 20 units at bedtime.  Emergency Department Course:  Review of Systems:   Pt denies any fevers, chills, nausea, vomiting, diarrhea, constipation, abdominal pain, shortness of breath, dyspnea on exertion, orthopnea, cough, wheezing, palpitations, headache, vision changes, lightheadedness, dizziness, melena.  Review of systems are otherwise negative  Past Medical History:  Diagnosis Date  . Cancer ([place]) dx'd 09/2013   Lung ca  . Diabetes ([place]) 09/08/2016   type I diabetic patient reported on 09/13/16   . Diabetes mellitus    insulin  . Diabetic neuropathy ([place]) 09/08/2016  . GERD (gastroesophageal reflux disease)    no meds for  . History of hiatal hernia   . Hx of radiation therapy 12/16/13-01/30/14   lung 66Gy  . Hypertension   .  Malignant neoplasm of right upper lobe of lung ([place]) 11/28/2013  . Neuropathy ([place])   . Psoriasis   . Psoriatic arthritis ([place]) 09/08/2016  . Rheumatoid arthritis ([place])   . Shortness of breath dyspnea    with exertion   Past Surgical History:  Procedure Laterality Date  . ABDOMINAL HYSTERECTOMY    . ABDOMINAL SURGERY    . BALLOON DILATION N/A 12/12/2014   Procedure: BALLOON DILATION;  Surgeon: Inda Castle, MD;  Location: [facility];  Service: Endoscopy;  Laterality: N/A;  . CHOLECYSTECTOMY    . COLONOSCOPY N/A 02/07/2016   Procedure: COLONOSCOPY;  Surgeon: Doran Stabler, MD;  Location: [facility] END[facility]COPY;  Service: Endoscopy;  Laterality: N/A;  . DILATION AND CURETTAGE OF UTERUS    . ESOPHAGOGASTRODUODENOSCOPY N/A 12/12/2014   Procedure: ESOPHAGOGASTRODUODENOSCOPY (EGD);  Surgeon: Inda Castle, MD;  Location: [facility];  Service: Endoscopy;  Laterality: N/A;  with dilation  . ESOPHAGOGASTRODUODENOSCOPY (EGD) WITH PROPOFOL N/A 10/09/2014   Procedure: ESOPHAGOGASTRODUODENOSCOPY (EGD) WITH PROPOFOL;  Surgeon: Inda Castle, MD;  Location: [facility];  Service: Endoscopy;  Laterality: N/A;  . ESOPHAGOGASTRODUODENOSCOPY (EGD) WITH PROPOFOL N/A 09/19/2016   Procedure: ESOPHAGOGASTRODUODENOSCOPY (EGD) WITH PROPOFOL;  Surgeon: Doran Stabler, MD;  Location: [facility] ENDOSCOPY;  Service: Gastroenterology;  Laterality: N/A;  with savary dil tt  . FRACTURE SURGERY    . ORIF ANKLE FRACTURE Right 07/04/2015   Procedure: OPEN REDUCTION INTERNAL FIXATION (ORIF) ANKLE FRACTURE;  Surgeon: Newt Minion, MD;  Location: [facility];  Service: Orthopedics;  Laterality: Right;  OPEN REDUCTION, INTERNAL FIXATION OF RIGHT ANKLE FRACTURE.   [name]  ORIF ANKLE FRACTURE Left 02/10/2016   Procedure: OPEN REDUCTION INTERNAL FIXATION (ORIF) ANKLE FRACTURE;  Surgeon: Newt Minion, MD;  Location: [facility];  Service: Orthopedics;  Laterality: Left;  . SAVORY DILATION N/A 10/09/2014   Procedure: SAVORY DILATION;  Surgeon: Inda Castle, MD;  Location: [facility];  Service: Endoscopy;  Laterality: N/A;  . SAVORY DILATION N/A 09/19/2016   Procedure: SAVORY DILATION;  Surgeon: Doran Stabler, MD;  Location: [facility] ENDOSCOPY;  Service: Gastroenterology;  Laterality: N/A;  . TONSILLECTOMY     Social History:  reports that she quit smoking about 18 years ago. Her smoking use included Cigarettes. She has a 135.00 pack-year smoking history. She has never used smokeless tobacco. She reports that she does not drink alcohol or use drugs. Patient lives at [place]  . Carboplatin Other (See Comments)    Neuropathy  . Remicade [Infliximab] Anaphylaxis  . Hydromorphone Rash    Family History  Problem Relation Age of Onset  . Other Mother   . Other Father     failure to thrive  . Hypertension Other   . Stroke Other   . Heart attack Other   . Hemachromatosis Other   . Rheum arthritis      both sets of grandparents and father      Prior to Admission medications   Medication Sig Start Date End Date Taking? Authorizing Provider  B-D UF III MINI PEN NEEDLES 31G X 5 MM MISC U TO CHECK SUGAR FID 07/25/16  Yes Historical Provider, MD  clobetasol cream (TEMOVATE) 6.31 % Apply 1 application topically 2 (two) times daily. 09/09/16  Yes Bo Merino, MD  DULoxetine (CYMBALTA) 60 MG capsule Take 1 capsule (60 mg total) by mouth 2 (two) times daily. 08/16/16  Yes Britt Bottom, MD  fluticasone-salmeterol (ADVAIR HFA) 497-02 MCG/ACT inhaler Inhale 2 puffs into the lungs 2 (two) times daily as needed.    Yes Historical Provider, MD  HORIZANT 600 MG TBCR TAKE (600 MG) 1 TABLET TWICE DAILY 05/02/16  Yes Historical Provider, MD  HORIZANT 600 MG TBCR TAKE 2 TABLETS DAILY AS DIRECTED 09/13/16  Yes Melvenia Beam, MD  Insulin Detemir (LEVEMIR FLEXPEN) 100 UNIT/ML Pen Inject 20 units into the skin at bedtime. 07/27/16  Yes Philemon Kingdom, MD  insulin lispro (HUMALOG KWIKPEN) 100 UNIT/ML KiwkPen INJECT 8-20  units 3x a day under skin Patient taking differently: Inject 8-20 Units into the skin 4 (four) times daily. Per patient sliding scale. 06/16/16  Yes Philemon Kingdom, MD  Insulin Pen Needle 32G X 5 MM MISC Used to check sugar 5 times daily. 07/27/16  Yes Philemon Kingdom, MD  ketoconazole (NIZORAL) 2 % cream Apply 1 application topically daily. Apply cream to rash/psoriasis to sacrum daily. Apply after TEMOVATE cream.   Yes Historical Provider, MD  [facility] DELICA LANCETS FINE MISC USE TO TEST BLOOD SUGAR FIVE TIMES DAILY AS NEEDED 07/27/16  Yes Philemon Kingdom, MD  [facility] VERIO test strip Use 4x a day 07/27/16  Yes Philemon Kingdom, MD  oxyCODONE-acetaminophen (PERCOCET/ROXICET) 5-325 MG tablet TAKE 1 TABLET BY MOUTH EVERY4-6 HOURS AS NEEDED FOR PAIN. 08/20/16  Yes Historical Provider, MD  solifenacin (VESICARE) 5 MG tablet Take 5 mg by mouth at bedtime.    Yes Historical Provider, MD  sulfaSALAzine (AZULFIDINE) 500 MG EC tablet Take 2 tablets (1,000 mg total) by mouth 2 (two) times daily. 09/15/16  Yes Bo Merino, MD  potassium chloride SA (K-DUR,KLOR-CON) 20 MEQ  tablet Take 20 mEq by mouth 2 (two) times daily.    Historical Provider, MD    Physical Exam: BP 180/84   Pulse 101   Temp 97.6 F (36.4 C) (Oral)   Resp 20   SpO2 95%   General: Older Caucasian female. Awake and alert and oriented x3. No acute cardiopulmonary distress.  HEENT: Normocephalic atraumatic.  Right and left ears normal in appearance.  Pupils equal, round, reactive to light. Extraocular muscles are intact. Sclerae anicteric and noninjected.  Moist mucosal membranes. No mucosal lesions.  Neck: Neck supple without lymphadenopathy. No carotid bruits. No masses palpated.  Cardiovascular: Regular rate with normal S1-S2 sounds. No murmurs, rubs, gallops auscultated. No JVD.  Respiratory: Good respiratory effort with no wheezes, rales, rhonchi. Lungs clear to auscultation bilaterally.  No accessory muscle use. Abdomen:  Soft, nontender, nondistended. Active bowel sounds. No masses or hepatosplenomegaly  Skin: No rashes, lesions, or ulcerations.  Dry, warm to touch. 2+ dorsalis pedis and radial pulses. Musculoskeletal: No calf or leg pain. All major joints not erythematous nontender.  No upper or lower joint deformation.  Good ROM.  No contractures  Psychiatric: Intact judgment and insight. Pleasant and cooperative. Neurologic: No focal neurological deficits. Strength is 5/5 and symmetric in upper and lower extremities.  Cranial nerves II through XII are grossly intact.           Labs on Admission: I have personally reviewed following labs and imaging studies  CBC:  Recent Labs Lab 10/01/16 0945  WBC 8.2  HGB 13.6  HCT 42.4  MCV 83.6  PLT 259   Basic Metabolic Panel:  Recent Labs Lab 10/01/16 0945  NA 136  K 4.2  CL 101  CO2 24  GLUCOSE 356*  BUN 10  CREATININE 0.74  CALCIUM 9.1   GFR: Estimated Creatinine Clearance: 62.4 mL/min (by C-G formula based on SCr of 0.74 mg/dL). Liver Function Tests:  Recent Labs Lab 10/01/16 0945  AST 31  ALT 62*  ALKPHOS 329*  BILITOT 0.4  PROT 6.6  ALBUMIN 3.5   No results for input(s): LIPASE, AMYLASE in the last 168 hours. No results for input(s): AMMONIA in the last 168 hours. Coagulation Profile: No results for input(s): INR, PROTIME in the last 168 hours. Cardiac Enzymes: No results for input(s): CKTOTAL, CKMB, CKMBINDEX, TROPONINI in the last 168 hours. BNP (last 3 results) No results for input(s): PROBNP in the last 8760 hours. HbA1C: No results for input(s): HGBA1C in the last 72 hours. CBG:  Recent Labs Lab 10/01/16 1414 10/01/16 1518  GLUCAP 304* 259*   Lipid Profile: No results for input(s): CHOL, HDL, LDLCALC, TRIG, CHOLHDL, LDLDIRECT in the last 72 hours. Thyroid Function Tests: No results for input(s): TSH, T4TOTAL, FREET4, T3FREE, THYROIDAB in the last 72 hours. Anemia Panel: No results for input(s): VITAMINB12,  FOLATE, FERRITIN, TIBC, IRON, RETICCTPCT in the last 72 hours. Urine analysis:    Component Value Date/Time   COLORURINE YELLOW 10/01/2016 [place] 10/01/2016 1204   LABSPEC 1.031 (H) 10/01/2016 1204   LABSPEC 1.030 08/25/2015 1130   PHURINE 6.5 10/01/2016 1204   GLUCOSEU >1000 (A) 10/01/2016 1204   GLUCOSEU Negative 08/25/2015 1130   HGBUR NEGATIVE 10/01/2016 1204   BILIRUBINUR NEGATIVE 10/01/2016 1204   BILIRUBINUR Negative 08/25/2015 1130   KETONESUR 15 (A) 10/01/2016 1204   PROTEINUR NEGATIVE 10/01/2016 1204   UROBILINOGEN 0.2 08/25/2015 1130   NITRITE NEGATIVE 10/01/2016 1204   LEUKOCYTESUR NEGATIVE 10/01/2016 1204   LEUKOCYTESUR Negative  08/25/2015 1130   Sepsis Labs: '@LABRCNTIP'$ (procalcitonin:4,lacticidven:4) )No results found for this or any previous visit (from the past 240 hour(s)).   Radiological Exams on Admission: No results found.   Assessment/Plan: #1 ulcer of colitis flare  Patient to start prednisone  Follow up with GI as instructed #2 diabetes type 2  Increase Levemir to 30 units at bedtime  Humalog 10 units 3 times a day with sliding scale insulin  I would recommend early follow-up with her care physician or endocrinologist in the early R the week to assure that the patient's blood sugars remain under control  Continue diabetic diet  Discussed symptoms of hypoglycemia  Discussed that patient should seek follow-up sooner if blood sugars remain greater than 400. #3 hypertension  Start 10 mg of lisinopril daily  Thank you for allowing me to just be in the care of this patient.  Truett Mainland, DO [facility] Pager [phone]  If 7PM-7AM, please contact night-coverage [email or web address] Password [email or web address]

## 2016-10-01 NOTE — ED Notes (Signed)
Pt ambulated independently to restroom. Tolerated well. RN at bedside.

## 2016-10-01 NOTE — ED Provider Notes (Signed)
[facility] DEPT Provider Note   CSN: [id number] Arrival date & time: 10/01/16  1194     History   Chief Complaint Chief Complaint  Patient presents with  . Rectal Bleeding    HPI Megan [name] is a 75 y.o. female.  75 year old Caucasian female with a past medical history significant for linsulin-dependent diabetes, diabetic neuropathy, GERD, hiatal hernia, hypertension, ulcerative colitis, history of lung cancerthat presents to the ED today with complaints of rectal bleeding. Patient states that yesterday she had the urge to defecate however was unable to have a bowel movement. States that she woke up approximately 3 AM this morning with diarrhea. Patient also endorses having dark red blood in her stool this morning. States she's had 4-5 episodes of dark red stool since 3 AM this morning. The patient was diagnosed with colitis back in 11/2015 with similar symptom but complained of abd pain at that and states this feels different. She denies any abd pain at this time. Patient states that she has not had any further complications from this and controls her diarrhea with immodium. She colonoscopy performed on 4/17 by Dr Loletha Carrow that showed mild pancolitis consistent with UC. Biopsies also confirmed this. She is not currently on any medication for UC. She also had an endoscopy performed on 11/17 for dysphagia that showed benging esophageal stricture. She also endorses feeling weak since yesterday. Feels she may be dehydrated. She states her blood sugars have been elevated in the 500s yesterday. She states that she has had poor control of her blood sugars because she is not sure how to dose her insulin based on carb counting. Patient denies any fever, chills, lightheadedness, dizziness, headache, vision changes, cough, chest pain, shortness of breath, abdominal pain, nausea, emesis, urinary symptoms, melena, numbness/tingling.      Past Medical History:  Diagnosis Date  . Cancer ([place])  dx'd 09/2013   Lung ca  . Diabetes ([place]) 09/08/2016   type I diabetic patient reported on 09/13/16   . Diabetes mellitus    insulin  . Diabetic neuropathy ([place]) 09/08/2016  . GERD (gastroesophageal reflux disease)    no meds for  . History of hiatal hernia   . Hx of radiation therapy 12/16/13-01/30/14   lung 66Gy  . Hypertension   . Malignant neoplasm of right upper lobe of lung ([place]) 11/28/2013  . Neuropathy ([place])   . Psoriasis   . Psoriatic arthritis ([place]) 09/08/2016  . Rheumatoid arthritis ([address])   . Shortness of breath dyspnea    with exertion    Patient Active Problem List   Diagnosis Date Noted  . Psoriatic arthropathy ([place]) 09/08/2016  . Psoriatic arthritis ([place]) 09/08/2016  . Hypertension 09/08/2016  . Diabetic neuropathy ([place]) 09/08/2016  . Diabetes ([place]) 09/08/2016  . LADA (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), managed as type 1 ([place]) 05/04/2016  . Syncope 03/25/2016  . Malnutrition of moderate degree 02/29/2016  . Skin breakdown 02/26/2016  . Leukocytosis 02/17/2016  . Diabetic ketoacidosis with coma associated with diabetes mellitus due to underlying condition ([place])   . Abnormal finding on GI tract imaging   . Hematochezia   . Acute kidney injury ([place]) 02/04/2016  . Acute colitis 02/04/2016  . RLS (restless legs syndrome) 12/11/2015  . Diarrhea due to drug 08/11/2015  . Liver dysfunction 08/11/2015  . Lumbago 05/06/2015  . Hot flashes 04/14/2015  . Peripheral edema 04/14/2015  . Rheumatoid arthritis ([place]) 04/14/2015  . Encounter for antineoplastic immunotherapy 04/06/2015  . Emphysema of lung ([place]) 11/26/2014  .  Radiation-induced esophageal stricture 10/01/2014  . Tachycardia 08/27/2014  . Depression 08/27/2014  . Palliative care encounter 08/21/2014  . DNR (do not resuscitate) discussion 08/21/2014  . Neuropathic pain 08/21/2014  . Cancer associated pain 07/29/2014  . Malignant cachexia ([place]) 07/29/2014  . Lung collapse 07/29/2014  . Fatigue 07/18/2014    . Neuropathy due to chemotherapeutic drug ([place]) 07/18/2014  . Leg cramps 07/18/2014  . Hypoalbuminemia 07/18/2014  . Thrombocytopenia, unspecified 07/18/2014  . Dyspnea 04/28/2014  . Radiation esophagitis 04/10/2014  . Right-sided chest wall pain 04/01/2014  . Cancer of middle lobe of lung ([place]) 01/17/2014  . Malignant neoplasm of right upper lobe of lung ([place]) 11/28/2013  . Lung mass 11/09/2013    Past Surgical History:  Procedure Laterality Date  . ABDOMINAL HYSTERECTOMY    . ABDOMINAL SURGERY    . BALLOON DILATION N/A 12/12/2014   Procedure: BALLOON DILATION;  Surgeon: Inda Castle, MD;  Location: [facility];  Service: Endoscopy;  Laterality: N/A;  . CHOLECYSTECTOMY    . COLONOSCOPY N/A 02/07/2016   Procedure: COLONOSCOPY;  Surgeon: Doran Stabler, MD;  Location: [facility] ENDOSCOPY;  Service: Endoscopy;  Laterality: N/A;  . DILATION AND CURETTAGE OF UTERUS    . ESOPHAGOGASTRODUODENOSCOPY N/A 12/12/2014   Procedure: ESOPHAGOGASTRODUODENOSCOPY (EGD);  Surgeon: Inda Castle, MD;  Location: [facility];  Service: Endoscopy;  Laterality: N/A;  with dilation  . ESOPHAGOGASTRODUODENOSCOPY (EGD) WITH PROPOFOL N/A 10/09/2014   Procedure: ESOPHAGOGASTRODUODENOSCOPY (EGD) WITH PROPOFOL;  Surgeon: Inda Castle, MD;  Location: [facility];  Service: Endoscopy;  Laterality: N/A;  . ESOPHAGOGASTRODUODENOSCOPY (EGD) WITH PROPOFOL N/A 09/19/2016   Procedure: ESOPHAGOGASTRODUODENOSCOPY (EGD) WITH PROPOFOL;  Surgeon: Doran Stabler, MD;  Location: [facility] ENDOSCOPY;  Service: Gastroenterology;  Laterality: N/A;  with savary dil tt  . FRACTURE SURGERY    . ORIF ANKLE FRACTURE Right 07/04/2015   Procedure: OPEN REDUCTION INTERNAL FIXATION (ORIF) ANKLE FRACTURE;  Surgeon: Newt Minion, MD;  Location: [facility];  Service: Orthopedics;  Laterality: Right;  OPEN REDUCTION, INTERNAL FIXATION OF RIGHT ANKLE FRACTURE.   [name] ORIF ANKLE FRACTURE Left 02/10/2016   Procedure: OPEN REDUCTION INTERNAL FIXATION (ORIF)  ANKLE FRACTURE;  Surgeon: Newt Minion, MD;  Location: [facility];  Service: Orthopedics;  Laterality: Left;  . SAVORY DILATION N/A 10/09/2014   Procedure: SAVORY DILATION;  Surgeon: Inda Castle, MD;  Location: [facility];  Service: Endoscopy;  Laterality: N/A;  . SAVORY DILATION N/A 09/19/2016   Procedure: SAVORY DILATION;  Surgeon: Doran Stabler, MD;  Location: [facility] ENDOSCOPY;  Service: Gastroenterology;  Laterality: N/A;  . TONSILLECTOMY      OB History    No data available       Home Medications    Prior to Admission medications   Medication Sig Start Date End Date Taking? Authorizing Provider  B-D UF III MINI PEN NEEDLES 31G X 5 MM MISC U TO CHECK SUGAR FID 07/25/16   Historical Provider, MD  clobetasol cream (TEMOVATE) 5.40 % Apply 1 application topically 2 (two) times daily. 09/09/16   Bo Merino, MD  DULoxetine (CYMBALTA) 60 MG capsule Take 1 capsule (60 mg total) by mouth 2 (two) times daily. 08/16/16   Britt Bottom, MD  fluticasone-salmeterol (ADVAIR HFA) 086-76 MCG/ACT inhaler Inhale 2 puffs into the lungs 2 (two) times daily as needed.     Historical Provider, MD  HORIZANT 600 MG TBCR TAKE (600 MG) 1 TABLET TWICE DAILY 05/02/16   Historical Provider, MD  Freddi Starr  600 MG TBCR TAKE 2 TABLETS DAILY AS DIRECTED 09/13/16   Melvenia Beam, MD  Insulin Detemir (LEVEMIR FLEXPEN) 100 UNIT/ML Pen Inject 20 units into the skin at bedtime. 07/27/16   Philemon Kingdom, MD  insulin lispro (HUMALOG KWIKPEN) 100 UNIT/ML KiwkPen INJECT 8-20 units 3x a day under skin Patient taking differently: Inject 8-20 Units into the skin 4 (four) times daily. Per patient sliding scale. 06/16/16   Philemon Kingdom, MD  Insulin Pen Needle 32G X 5 MM MISC Used to check sugar 5 times daily. 07/27/16   Philemon Kingdom, MD  ketoconazole (NIZORAL) 2 % cream Apply 1 application topically daily. Apply cream to rash/psoriasis to sacrum daily. Apply after TEMOVATE cream.    Historical Provider, MD  [facility]  DELICA LANCETS FINE MISC USE TO TEST BLOOD SUGAR FIVE TIMES DAILY AS NEEDED 07/27/16   Philemon Kingdom, MD  [facility] VERIO test strip Use 4x a day 07/27/16   Philemon Kingdom, MD  oxyCODONE-acetaminophen (PERCOCET/ROXICET) 5-325 MG tablet TAKE 1 TABLET BY MOUTH EVERY4-6 HOURS AS NEEDED FOR PAIN. 08/20/16   Historical Provider, MD  potassium chloride SA (K-DUR,KLOR-CON) 20 MEQ tablet Take 20 mEq by mouth 2 (two) times daily.    Historical Provider, MD  solifenacin (VESICARE) 5 MG tablet Take 5 mg by mouth at bedtime.     Historical Provider, MD  sulfaSALAzine (AZULFIDINE) 500 MG EC tablet Take 2 tablets (1,000 mg total) by mouth 2 (two) times daily. 09/15/16   Bo Merino, MD    Family History Family History  Problem Relation Age of Onset  . Other Mother   . Other Father     failure to thrive  . Hypertension Other   . Stroke Other   . Heart attack Other   . Hemachromatosis Other   . Rheum arthritis      both sets of grandparents and father    Social History Social History  Substance Use Topics  . Smoking status: Former Smoker    Packs/day: 3.00    Years: 45.00    Types: Cigarettes    Quit date: 12/05/1997  . Smokeless tobacco: Never Used  . Alcohol use No     Allergies   Carboplatin; Remicade [infliximab]; and Hydromorphone   Review of Systems Review of Systems  Constitutional: Negative for chills and fever.  Eyes: Negative for pain and visual disturbance.  Respiratory: Negative for cough and shortness of breath.   Cardiovascular: Negative for chest pain, palpitations and leg swelling.  Gastrointestinal: Positive for blood in stool, constipation and diarrhea. Negative for abdominal distention, abdominal pain, nausea and vomiting.  Genitourinary: Negative for dysuria, frequency, hematuria and urgency.  Skin: Negative for color change and rash.  Neurological: Positive for weakness (generalized). Negative for dizziness, syncope, light-headedness, numbness and headaches.    All other systems reviewed and are negative.    Physical Exam Updated Vital Signs BP 170/94 (BP Location: Right Arm)   Pulse 98   Temp 97.6 F (36.4 C) (Oral)   Resp 18   SpO2 98%   Physical Exam  Constitutional: She is oriented to person, place, and time. She appears well-developed and well-nourished. No distress.  HENT:  Head: Normocephalic and atraumatic.  Mouth/Throat: Uvula is midline, oropharynx is clear and moist and mucous membranes are normal. Mucous membranes are not pale.  Eyes: Conjunctivae are normal. Pupils are equal, round, and reactive to light. Right eye exhibits no discharge. Left eye exhibits no discharge. No scleral icterus.  No conjunctival pallor noted.  Neck:  Normal range of motion. Neck supple. No thyromegaly present.  Cardiovascular: Regular rhythm, normal heart sounds and intact distal pulses.  Tachycardia present.  Exam reveals no gallop and no friction rub.   No murmur heard. Pulses are 2+ bilat.  Pulmonary/Chest: Effort normal and breath sounds normal. No respiratory distress. She has no decreased breath sounds. She has no wheezes.  Abdominal: Soft. Bowel sounds are normal. She exhibits no distension. There is no tenderness. There is no rebound and no guarding.  Genitourinary:  Genitourinary Comments: Rectal exam was performed with chaperone present. No external or internal hemorrhoids noted. No tenderness to palpation of the rectal vault. There was soft brown stool noted in the rectal vault. Rectal tone was normal. Hemoccult was positive.  Musculoskeletal: Normal range of motion.  Lymphadenopathy:    She has no cervical adenopathy.  Neurological: She is alert and oriented to person, place, and time.  Skin: Skin is warm and dry. Capillary refill takes less than 2 seconds. No pallor.  Nursing note and vitals reviewed.    ED Treatments / Results  Labs (all labs ordered are listed, but only abnormal results are displayed) Labs Reviewed   COMPREHENSIVE METABOLIC PANEL - Abnormal; Notable for the following:       Result Value   Glucose, Bld 356 (*)    ALT 62 (*)    Alkaline Phosphatase 329 (*)    All other components within normal limits  CBC - Abnormal; Notable for the following:    RDW 16.5 (*)    All other components within normal limits  URINALYSIS, ROUTINE W REFLEX MICROSCOPIC (NOT AT [facility]) - Abnormal; Notable for the following:    Specific Gravity, Urine 1.031 (*)    Glucose, UA >1000 (*)    Ketones, ur 15 (*)    All other components within normal limits  URINE MICROSCOPIC-ADD ON - Abnormal; Notable for the following:    Squamous Epithelial / LPF 0-5 (*)    Bacteria, UA RARE (*)    All other components within normal limits  POC OCCULT BLOOD, ED - Abnormal; Notable for the following:    Fecal Occult Bld POSITIVE (*)    All other components within normal limits  CBG MONITORING, ED - Abnormal; Notable for the following:    Glucose-Capillary 304 (*)    All other components within normal limits  CBG MONITORING, ED - Abnormal; Notable for the following:    Glucose-Capillary 259 (*)    All other components within normal limits  TYPE AND SCREEN    EKG  EKG Interpretation None       Radiology No results found.  Procedures Procedures (including critical care time)  Medications Ordered in ED Medications  sodium chloride 0.9 % bolus 1,000 mL (0 mLs Intravenous Stopped 10/01/16 1353)  insulin aspart (novoLOG) injection 10 Units (10 Units Subcutaneous Given 10/01/16 1416)  sodium chloride 0.9 % bolus 500 mL (0 mLs Intravenous Stopped 10/01/16 1625)     Initial Impression / Assessment and Plan / ED Course  I have reviewed the triage vital signs and the nursing notes.  Pertinent labs & imaging results that were available during my care of the patient were reviewed by me and considered in my medical decision making (see chart for details).  Clinical Course   Patient presents to the ED for rectal bleeding  and elevated bg. Patient rectal exam was hemoccult positive. No gross hematochezia or melena. Hgb is normal. Patient has history of UC and feel this is related  to the UC flare. Abd exam was benign. Denies any abd pain. Consulted with [facility]. They feel this is related to her UC flare. They do not recommend admission. They feel patients needs prednisone '40mg'$  daily until she can see them in the office this week. Patient also has elevated bs of 400. She was given 10 units regular insulin and 1.5 fluid bolus with improvement in bs to the 200s. Patient anion gap is normal. Small amount of ketones in the urine but low suspicion for DKA. I am cautious to start patient on prednisone given her recent elevate bs. I consulted with hospital medicine to ask them to see patient and advise on insulin regimen. Dr. Nehemiah Settle with [facility] saw patient and recommends increasing her levemir to 30 units at bedtime and huamlog 10 units 3x per day with sliding scale insulin. He dicussed with her need to follow with pcp this week for recheck of bs. Patient was also noted to be htn. States that she has hx of htn but bp meds dc by pcp. HTN in ed. Dr. Nehemiah Settle recommends starting lisinopril 10 mg. She also needs to see her pcp this week for bp recheck. Patient has been tachycardic in ED. Fluids improved tachycardia. Patient has history of tachycardia on review of prior vs. I personally sent a epic message to Dr. Lelan Pons and Dr. Quay Burow to try and see patient this week for close follow up. Patient was seen and examined by Dr. Rex Kras who is agreeable to the above plan. Pt is hemodynamically stable, in NAD, & able to ambulate in the ED. She has has no episodes of rectal bleeding.  Pain has been managed & has no complaints prior to dc. Pt is comfortable with above plan and is stable for discharge at this time. All questions were answered prior to disposition. Strict return precautions for f/u to the ED were discussed.    Final Clinical  Impressions(s) / ED Diagnoses   Final diagnoses:  Rectal bleeding  Elevated blood sugar  Elevated blood pressure reading    New Prescriptions Discharge Medication List as of 10/01/2016  4:16 PM       Doristine Devoid, PA-C 10/01/16 [place], MD 10/04/16 [phone]

## 2016-10-03 ENCOUNTER — Encounter (INDEPENDENT_AMBULATORY_CARE_PROVIDER_SITE_OTHER): Payer: Self-pay | Admitting: Orthopedic Surgery

## 2016-10-03 ENCOUNTER — Ambulatory Visit (INDEPENDENT_AMBULATORY_CARE_PROVIDER_SITE_OTHER): Payer: Medicare Other

## 2016-10-03 ENCOUNTER — Ambulatory Visit (INDEPENDENT_AMBULATORY_CARE_PROVIDER_SITE_OTHER): Payer: Medicare Other | Admitting: Orthopedic Surgery

## 2016-10-03 ENCOUNTER — Telehealth: Payer: Self-pay | Admitting: Gastroenterology

## 2016-10-03 DIAGNOSIS — M25512 Pain in left shoulder: Secondary | ICD-10-CM | POA: Diagnosis not present

## 2016-10-03 DIAGNOSIS — M25511 Pain in right shoulder: Secondary | ICD-10-CM

## 2016-10-03 MED ORDER — METHYLPREDNISOLONE ACETATE 40 MG/ML IJ SUSP
40.0000 mg | INTRAMUSCULAR | Status: AC | PRN
  Administered 2016-10-03: 40 mg via INTRA_ARTICULAR

## 2016-10-03 MED ORDER — LIDOCAINE HCL 1 % IJ SOLN
5.0000 mL | INTRAMUSCULAR | Status: AC | PRN
  Administered 2016-10-03: 5 mL

## 2016-10-03 NOTE — Progress Notes (Signed)
Office Visit Note   Patient: Megan [name]           Date of Birth: 1941/06/15           MRN: [id number] Visit Date: 10/03/2016              Requested by: Binnie Rail, MD [place], [place] [address] PCP: Binnie Rail, MD   Assessment & Plan: Visit Diagnoses:  1. Acute pain of right shoulder   2. Acute pain of left shoulder     Plan: Impingement bilateral shoulders after informed consent both shoulders were injected from the posterior portal C tolerated this well. Patient did have some small swelling in the medial aspect of her right foot this is getting better and I recommended against pursuing surgical intervention for excision of the swollen area. Feel that this should continue to resolve on its own.  Follow-Up Instructions: Return if symptoms worsen or fail to improve.   Orders:  Orders Placed This Encounter  Procedures  . XR Shoulder Right  . XR Shoulder Left   No orders of the defined types were placed in this encounter.     Procedures: Large Joint Inj Date/Time: 10/03/2016 10:55 AM Performed by: DUDA, MARCUS V Authorized by: Newt Minion   Consent Given by:  Patient Site marked: the procedure site was marked   Timeout: prior to procedure the correct patient, procedure, and site was verified   Indications:  Pain and diagnostic evaluation Location:  Shoulder Site:  R subacromial bursa Prep: patient was prepped and draped in usual sterile fashion   Needle Size:  22 G Needle Length:  1.5 inches Approach:  Posterior Ultrasound Guidance: No   Fluoroscopic Guidance: No   Arthrogram: No   Medications:  5 mL lidocaine 1 %; 40 mg methylPREDNISolone acetate 40 MG/ML Aspiration Attempted: No   Patient tolerance:  Patient tolerated the procedure well with no immediate complications Large Joint Inj Date/Time: 10/03/2016 10:55 AM Performed by: DUDA, MARCUS V Authorized by: Newt Minion   Consent Given by:  Patient Site marked: the procedure  site was marked   Timeout: prior to procedure the correct patient, procedure, and site was verified   Indications:  Pain and diagnostic evaluation Location:  Shoulder Site:  L subacromial bursa Prep: patient was prepped and draped in usual sterile fashion   Needle Size:  22 G Needle Length:  1.5 inches Approach:  Posterior Ultrasound Guidance: No   Fluoroscopic Guidance: No   Arthrogram: No   Medications:  5 mL lidocaine 1 %; 40 mg methylPREDNISolone acetate 40 MG/ML Aspiration Attempted: No   Patient tolerance:  Patient tolerated the procedure well with no immediate complications     Clinical Data: No additional findings.   Subjective: Chief Complaint  Patient presents with  . Right Shoulder - Pain  . Left Shoulder - Pain    Patient presents today with bilateral shoulder pain. Right is worse than the left. She was recently in hospital for rectal bleeding on Saturday and has been having pain bilateral shoulders since. She has had decreased range of motion. Request evaluation and possible injection today.    Review of Systems   Objective: Vital Signs: There were no vitals taken for this visit.  Physical Exam on examination patient is alert oriented no adenopathy well-dressed normal affect normal rest or efforts she does have an antalgic gait difficulty getting from sitting to a standing position without assistance. Examination of both shoulders  she has abduction flexion of only about 110 she has pain with Neer and Hawkins impingement test pain with drop arm test bilaterally. The before meals joint is tender to palpation bilaterally she has no radicular symptoms.  Ortho Exam  Specialty Comments:  No specialty comments available.  Imaging: Xr Shoulder Left  Result Date: 10/03/2016 Two-view radiographs the left shoulder shows a congruent glenohumeral joint space. There is A-C arthropathy. There is decreased subacromial joint space. The lung fields are clear.  Xr  Shoulder Right  Result Date: 10/03/2016 Two-view radiographs the right shoulder shows some A- C arthropathy. The glenohumeral joint is congruent. No lung field abnormalities. There is a decrease in the subacromial joint space.    PMFS History: Patient Active Problem List   Diagnosis Date Noted  . Psoriatic arthropathy ([place]) 09/08/2016  . Psoriatic arthritis ([place]) 09/08/2016  . Hypertension 09/08/2016  . Diabetic neuropathy ([place]) 09/08/2016  . Diabetes ([place]) 09/08/2016  . LADA (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), managed as type 1 ([place]) 05/04/2016  . Syncope 03/25/2016  . Malnutrition of moderate degree 02/29/2016  . Skin breakdown 02/26/2016  . Leukocytosis 02/17/2016  . Diabetic ketoacidosis with coma associated with diabetes mellitus due to underlying condition ([place])   . Abnormal finding on GI tract imaging   . Hematochezia   . Acute kidney injury ([place]) 02/04/2016  . Acute colitis 02/04/2016  . RLS (restless legs syndrome) 12/11/2015  . Diarrhea due to drug 08/11/2015  . Liver dysfunction 08/11/2015  . Lumbago 05/06/2015  . Hot flashes 04/14/2015  . Peripheral edema 04/14/2015  . Rheumatoid arthritis ([place]) 04/14/2015  . Encounter for antineoplastic immunotherapy 04/06/2015  . Emphysema of lung ([place]) 11/26/2014  . Radiation-induced esophageal stricture 10/01/2014  . Tachycardia 08/27/2014  . Depression 08/27/2014  . Palliative care encounter 08/21/2014  . DNR (do not resuscitate) discussion 08/21/2014  . Neuropathic pain 08/21/2014  . Cancer associated pain 07/29/2014  . Malignant cachexia ([place]) 07/29/2014  . Lung collapse 07/29/2014  . Fatigue 07/18/2014  . Neuropathy due to chemotherapeutic drug ([place]) 07/18/2014  . Leg cramps 07/18/2014  . Hypoalbuminemia 07/18/2014  . Thrombocytopenia, unspecified 07/18/2014  . Dyspnea 04/28/2014  . Radiation esophagitis 04/10/2014  . Right-sided chest wall pain 04/01/2014  . Cancer of middle lobe of lung ([place]) 01/17/2014  .  Malignant neoplasm of right upper lobe of lung ([place]) 11/28/2013  . Lung mass 11/09/2013   Past Medical History:  Diagnosis Date  . Cancer ([place]) dx'd 09/2013   Lung ca  . Diabetes ([place]) 09/08/2016   type I diabetic patient reported on 09/13/16   . Diabetes mellitus    insulin  . Diabetic neuropathy ([place]) 09/08/2016  . GERD (gastroesophageal reflux disease)    no meds for  . History of hiatal hernia   . Hx of radiation therapy 12/16/13-01/30/14   lung 66Gy  . Hypertension   . Malignant neoplasm of right upper lobe of lung ([place]) 11/28/2013  . Neuropathy ([place])   . Psoriasis   . Psoriatic arthritis ([place]) 09/08/2016  . Rheumatoid arthritis ([place])   . Shortness of breath dyspnea    with exertion    Family History  Problem Relation Age of Onset  . Other Mother   . Other Father     failure to thrive  . Hypertension Other   . Stroke Other   . Heart attack Other   . Hemachromatosis Other   . Rheum arthritis      both sets of grandparents and father  Past Surgical History:  Procedure Laterality Date  . ABDOMINAL HYSTERECTOMY    . ABDOMINAL SURGERY    . BALLOON DILATION N/A 12/12/2014   Procedure: BALLOON DILATION;  Surgeon: Inda Castle, MD;  Location: [facility];  Service: Endoscopy;  Laterality: N/A;  . CHOLECYSTECTOMY    . COLONOSCOPY N/A 02/07/2016   Procedure: COLONOSCOPY;  Surgeon: Doran Stabler, MD;  Location: [facility] ENDOSCOPY;  Service: Endoscopy;  Laterality: N/A;  . DILATION AND CURETTAGE OF UTERUS    . ESOPHAGOGASTRODUODENOSCOPY N/A 12/12/2014   Procedure: ESOPHAGOGASTRODUODENOSCOPY (EGD);  Surgeon: Inda Castle, MD;  Location: [facility];  Service: Endoscopy;  Laterality: N/A;  with dilation  . ESOPHAGOGASTRODUODENOSCOPY (EGD) WITH PROPOFOL N/A 10/09/2014   Procedure: ESOPHAGOGASTRODUODENOSCOPY (EGD) WITH PROPOFOL;  Surgeon: Inda Castle, MD;  Location: [facility];  Service: Endoscopy;  Laterality: N/A;  . ESOPHAGOGASTRODUODENOSCOPY (EGD) WITH PROPOFOL N/A  09/19/2016   Procedure: ESOPHAGOGASTRODUODENOSCOPY (EGD) WITH PROPOFOL;  Surgeon: Doran Stabler, MD;  Location: [facility];  Service: Gastroenterology;  Laterality: N/A;  with savary dil tt  . FRACTURE SURGERY    . ORIF ANKLE FRACTURE Right 07/04/2015   Procedure: OPEN REDUCTION INTERNAL FIXATION (ORIF) ANKLE FRACTURE;  Surgeon: Newt Minion, MD;  Location: [facility];  Service: Orthopedics;  Laterality: Right;  OPEN REDUCTION, INTERNAL FIXATION OF RIGHT ANKLE FRACTURE.   [name] ORIF ANKLE FRACTURE Left 02/10/2016   Procedure: OPEN REDUCTION INTERNAL FIXATION (ORIF) ANKLE FRACTURE;  Surgeon: Newt Minion, MD;  Location: [facility];  Service: Orthopedics;  Laterality: Left;  . SAVORY DILATION N/A 10/09/2014   Procedure: SAVORY DILATION;  Surgeon: Inda Castle, MD;  Location: [facility];  Service: Endoscopy;  Laterality: N/A;  . SAVORY DILATION N/A 09/19/2016   Procedure: SAVORY DILATION;  Surgeon: Doran Stabler, MD;  Location: [facility];  Service: Gastroenterology;  Laterality: N/A;  . TONSILLECTOMY     Social History   Occupational History  . Retired Other   Social History Main Topics  . Smoking status: Former Smoker    Packs/day: 3.00    Years: 45.00    Types: Cigarettes    Quit date: 12/05/1997  . Smokeless tobacco: Never Used  . Alcohol use No  . Drug use: No  . Sexual activity: Not Currently

## 2016-10-03 NOTE — Telephone Encounter (Signed)
Megan [name],    Please call this patient.   I rec'd a message from the ED provider who saw her for rectal bleeding.  It is difficult to tell from the records, but it is not clear to me that this bleeding is really from her colitis, which was mild when I found it in April and recently under very good control without medication.  And her glucose was over 500 in the ED.   The bleeding sounds more likely to be hemorrhoidal in nature.  Therefore, please have her stop the prednisone they prescribed in the ED.  Find out how she is feeling so I can make a plan for follow up.

## 2016-10-04 ENCOUNTER — Encounter: Payer: Self-pay | Admitting: Gastroenterology

## 2016-10-04 ENCOUNTER — Ambulatory Visit (INDEPENDENT_AMBULATORY_CARE_PROVIDER_SITE_OTHER): Payer: Medicare Other | Admitting: Gastroenterology

## 2016-10-04 VITALS — BP 120/78 | HR 95 | Ht 65.0 in | Wt 162.0 lb

## 2016-10-04 DIAGNOSIS — K51911 Ulcerative colitis, unspecified with rectal bleeding: Secondary | ICD-10-CM | POA: Diagnosis not present

## 2016-10-04 NOTE — Progress Notes (Signed)
[facility] Progress Note  Chief Complaint: Diarrhea with rectal bleeding, history of ulcerative colitis  Subjective  History:  Megan [name] sees me after an ED visit 3 days ago. As before, she is a somewhat limited historian due to her memory. When I last saw her in the clinic, she seemed to have no symptoms of colitis in the way of abdominal pain diarrhea or rectal bleeding, and in fact had been off medicines for months. She underwent upper endoscopy with balloon dilation of a radiation-induced esophageal stricture recently, and says she had moderate improvement in dysphagia after that. Seems that late last week she was having some loose bloody stool that went on for about 2 days before she presented to the ED. She denies any abdominal pain at that time or now. It seems they felt she was likely having a flare of her colitis after discussing it with my on-call partner. She was put on prednisone 40 mg a day, however her glucose was over 350. When I heard about it yesterday, I asked her to stop the prednisone and see me today. She says the bleeding stopped yesterday she is still having some intermittent loose stool. One Imodium seems to stop her diarrhea. She has not recently had any antibiotics. Of note, her colitis, when discovered in May of this year, was patchy in the transverse colon and sigmoid. She did not felt that she got any better on his occult HD, and she would not try Lialda because the tablets were too big. At that time, she also did not want to use Uceris because she was feeling better.  ROS: Cardiovascular:  no chest pain Respiratory: no dyspnea She is feeling intermittently dizzy today and also had some dysuria for which she saw urology yesterday. She says she she did not have a urinary infection and he treated her with Pyridium.  The patient's Past Medical, Family and Social History were reviewed and are on file in the EMR.  Objective:  Med list reviewed  Vital signs in last 24  hrs: Vitals:   10/04/16 1616  BP: 120/78  Pulse: 95    Physical Exam  Somewhat feeble elderly woman  HEENT: sclera anicteric, oral mucosa moist without lesions  Neck: supple, no thyromegaly, JVD or lymphadenopathy  Cardiac: RRR without murmurs, S1S2 heard, no peripheral edema  Pulm: clear to auscultation bilaterally, normal RR and effort noted  Abdomen: soft, no tenderness, with active bowel sounds. No guarding or palpable hepatosplenomegaly.  Skin; warm and dry, no jaundice or rash  Recent Labs:  Cbc nml 12/2  CMP Latest Ref Rng & Units 10/01/2016 06/28/2016 04/15/2016  Glucose 65 - 99 mg/dL 356(H) 347(H) 126(H)  BUN 6 - 20 mg/dL 10 18.1 14  Creatinine 0.44 - 1.00 mg/dL 0.74 1.0 0.62  Sodium 135 - 145 mmol/L 136 138 139  Potassium 3.5 - 5.1 mmol/L 4.2 3.8 3.6  Chloride 101 - 111 mmol/L 101 - 110  CO2 22 - 32 mmol/L 24 21(L) 24  Calcium 8.9 - 10.3 mg/dL 9.1 9.3 8.6(L)  Total Protein 6.5 - 8.1 g/dL 6.6 6.6 -  Total Bilirubin 0.3 - 1.2 mg/dL 0.4 0.89 -  Alkaline Phos 38 - 126 U/L 329(H) 149 -  AST 15 - 41 U/L 31 14 -  ALT 14 - 54 U/L 62(H) 13 -     '@ASSESSMENTPLANBEGIN'$ @ Assessment: Encounter Diagnosis  Name Primary?  . Ulcerative colitis with rectal bleeding, unspecified location [facility]) Yes   She is a very poor historian, and  does not seem to been having any symptoms of colitis until this episode late last week. It's possible it was some hemorrhoidal bleeding, but I think at this point we have to assume it may have been the colitis, especially since she has not been on any therapy. I think perhaps she has had ongoing diarrhea but perhaps just come to live with it and self treated with Imodium.   Plan: Uceris 9 mg once daily, 12 days worth of samples were given with instructions. We will call her next week to see she is feeling. If doing well, we'll continue that medicine for at least the next few months and follow-up   Total time 30 minutes, over half spent in  counseling and coordination of care.   Nelida Meuse III

## 2016-10-04 NOTE — Patient Instructions (Addendum)
Uceris samples:  One tablet every morning for 12 days  We will call you next week to see how you are feeling.   If you are age 74 or older, your body mass index should be between 23-30. Your Body mass index is 26.96 kg/m. If this is out of the aforementioned range listed, please consider follow up with your Primary Care Provider.  If you are age 33 or younger, your body mass index should be between 19-25. Your Body mass index is 26.96 kg/m. If this is out of the aformentioned range listed, please consider follow up with your Primary Care Provider.   Thank you for choosing [facility]  Dr Wilfrid Lund III

## 2016-10-04 NOTE — Telephone Encounter (Addendum)
Called patient, she states that the rectal bleeding is doing better, only a small amount on the tissue after a bm. Denies any pain. Instructed to stop taking her prednisone, she states only has today's dosage left and will not take. Patient saw her orthopedic doctor yesterday and states her blood pressure was normal. Blood sugar yesterday was 108 in the am, today it was 305 in the am. She called our office yesterday morning and was scheduled a follow up appointment, she will see Dr. Loletha Carrow this afternoon at 4:00.

## 2016-10-05 ENCOUNTER — Telehealth: Payer: Self-pay | Admitting: *Deleted

## 2016-10-05 NOTE — Telephone Encounter (Signed)
Called and spoke to pt. Advised AA,MD has meeting 10/26/16 that morning. Asked if she is willing to come at 4 or 430pm instead. Pt agreeable to come at 4pm instead. Advised her to check in by 345pm. She wrote this down and verbalized understanding.

## 2016-10-13 ENCOUNTER — Other Ambulatory Visit: Payer: Self-pay | Admitting: Neurology

## 2016-10-13 ENCOUNTER — Telehealth: Payer: Self-pay | Admitting: Internal Medicine

## 2016-10-13 NOTE — Telephone Encounter (Signed)
Pt needs one touch verio test strips 5 times a daily testing call into [facility] on [address]

## 2016-10-14 MED ORDER — ONETOUCH VERIO VI STRP: ORAL_STRIP | 11 refills | Status: DC

## 2016-10-14 NOTE — Telephone Encounter (Signed)
Refill submitted. 

## 2016-10-18 ENCOUNTER — Inpatient Hospital Stay (INDEPENDENT_AMBULATORY_CARE_PROVIDER_SITE_OTHER): Payer: Medicare Other | Admitting: Orthopedic Surgery

## 2016-10-21 ENCOUNTER — Telehealth: Payer: Self-pay

## 2016-10-21 NOTE — Telephone Encounter (Signed)
Patient returned call today with an update on how she is doing with her U.C. She has completed the Uceris and indicated that she is feeling well. No current rectal bleeding.

## 2016-10-25 ENCOUNTER — Other Ambulatory Visit: Payer: Self-pay

## 2016-10-25 MED ORDER — BUDESONIDE 9 MG PO TB24: 3.0000 mg | ORAL_TABLET | Freq: Every day | ORAL | 1 refills | Status: DC

## 2016-10-25 NOTE — Telephone Encounter (Signed)
I would very much like her to take it once a day for the next 2 months if it is affordable, then see me in the office in 2 months

## 2016-10-25 NOTE — Telephone Encounter (Signed)
Left a message to return call. Appointment booked for follow upand RX sent in.

## 2016-10-26 ENCOUNTER — Encounter: Payer: Self-pay | Admitting: Neurology

## 2016-10-26 ENCOUNTER — Ambulatory Visit (INDEPENDENT_AMBULATORY_CARE_PROVIDER_SITE_OTHER): Payer: Medicare Other | Admitting: Neurology

## 2016-10-26 ENCOUNTER — Ambulatory Visit: Payer: Medicare Other | Admitting: Neurology

## 2016-10-26 VITALS — BP 127/77 | HR 118 | Ht 65.0 in | Wt 158.2 lb

## 2016-10-26 DIAGNOSIS — G620 Drug-induced polyneuropathy: Secondary | ICD-10-CM | POA: Diagnosis not present

## 2016-10-26 DIAGNOSIS — T451X5A Adverse effect of antineoplastic and immunosuppressive drugs, initial encounter: Principal | ICD-10-CM

## 2016-10-26 MED ORDER — DULOXETINE HCL 60 MG PO CPEP: 60.0000 mg | ORAL_CAPSULE | Freq: Two times a day (BID) | ORAL | 12 refills | Status: DC

## 2016-10-26 MED ORDER — LORAZEPAM 0.5 MG PO TABS: ORAL_TABLET | ORAL | 3 refills | Status: DC

## 2016-10-26 MED ORDER — PREGABALIN 150 MG PO CAPS: 150.0000 mg | ORAL_CAPSULE | Freq: Two times a day (BID) | ORAL | 5 refills | Status: DC

## 2016-10-26 NOTE — Progress Notes (Signed)
[facility]    Provider:  Dr Jaynee Eagles Referring Provider: Binnie Rail, MD Primary Care Physician:  Binnie Rail, MD  HPI: This is a wonderful 75 years old white female with history of stage IIIB non-small cell lung cancer status postcarboplatin and paclitaxel with resultant painful neuropathy.  Interval history 10/26/2016: She is here for painful neuropathy. Her feet are numb. Not much pain in the feet anymore. She has pain in the hands. She can grip things and has some strength but the left hand is worse than the right. She has done well on the medications but today would like to try Lyrica instead of long-acting gabapentin. She is on 1200-'1800mg'$  of long-acting gabapentin so will start her at a higher dose of Lyrica '150mg'$  twice daily. Discussed side effects.    Interval History 08/12/2015: She has fallen and broken several bones in the right leg. She in a cast currently. She had to go to skilled nursing for several weeks. Her legs just buckled and she fell. It was late at night, she got up to go to the bathroom before she went to sleep. She got lightheaded and she crashed onher ankle. She crawled over to the phone and called 911 and went to the emergency room. She was Dxed with low blood pressure. At a later incident, she was walking around the bed and just went down, no LOC, no SOB, no CP. Not lightheaded. Her legs just buckled, both of them, did not trip. No weakness, she can get out of low seats, she can walk up stairs, no new weakness. She was very active all week, no illnesses or inciting events. Howvere she has had diarrhea for 2 months and she is dehydrated. She was placed on prednisone yesterday, cdiff was negative. She was not taking marinol. She was no taking vicadin or any pain killer at the time. Cymbalta is helping. Her left hand is getting number. She is not in pain with the neuropathy, more numbness. Before she gets up, she puts her head down to help with blood  pressure. They decreased her BP medication, then stopped it entirely. Her glucose has been running very high, 8 days ago 542 and 1 day ago was 210 and she is following with her pcp.   Interval history 06/18/2015: 4pm Tuesday feet started burning significantly, like on fire on the bootm. This was new. The left hand also started to burn. She has never had it before. The burning is better. It was very difficult to walk. She took ibuprofen and it helped. The burning went away in 20 minutes. The left more than the right. She had shoes on, no new shoes. She was reclining on her bed watching TV. Excruciating on the left. Does not feel the Lamictal is helping with the neuropathy or with mood.  Interval History 05/07/2015: This is a wonderful 75 years old white female with history of stage IIIB non-small cell lung cancer status postcarboplatin and paclitaxel with resultant painful neuropathy.The feet are about the same. Still on the Gralise. Lidocaine patches help somewhat. She is tolerating the Lamictal, no rashes. Discussed several options for her neuropathy, decided to slowly increase Lamictal to '50mg'$  then '75mg'$  and then '100mg'$ . This will hopefully help with her neuropathy and is a great medication for mood. Discussed at length. Ambien ishelping her sleep. Tried Lyrica before the Gralise and that did not work. The Gralise has worked the best so far.   Interval History 11/13/2014: She is feeling better. She  is taking the marinol and can sleep at night. The pain is manageable during the day with the Gralise. She has a new diagnosis of diabetes. Her thyroid function is elevated. She takes daily B12. She is having some swelling in her legs which is manageable however it makes it difficult to drive. She still has tingling in the fingers.   Her doctors include  [facility].  Dr Hipolito Bayley Dr. Wilson Singer  Interval History 09/03/2014: She is still struggling with her peripheral neuropathy,  nothing is helping. It is severe. Maybe the Gralise helped but then she ran out and didn't call for a prescription. She was recently given prednisone, cymbalta. Cymbalta made her symptoms worse. Started the Gralise and the 1800 mg worked. Belsomra didn't help. Ambien helps her sleep. Lyrica didn't work either for her peripheral neuropathy. Takes b12 daily and biotin. She is sleeping better with Ambien but the neuropathy is still severe. She can't fall asleep, possibly stress. Paresthesias are in the feet and hands. They occurred in the setting of platin-based chemotherapy. They are continuous, worse at night when sleeping. She is also very emotional, crying a lot. Her appetite is very poor.  Visit 08/13/14: This is a very pleasant 75 years old white female with history of stage IIIB non-small cell lung cancer status postcarboplatin and paclitaxel. Patient reports she had chemotherapy and radiation from [place] - April 2nd. 6 weeks later started a second set of chemo, 3 sessions every 3 weeks. The neuropathy started about 3 weeks ago. Feels like burning, pins and needles and lightning strikes. Feet and left hand > right hand. It is in the entire feet to the ankles and entire left hand and digits 3-5 right hand. Severe, cramping in the feet and calfs. She is crying in the office today. Oxycodone not helping with the pain, doesn't even touch it. Didn't try Lyrica. Cymbalta worsened the symptoms. Nothing is helping. She is using a cane. No falls. Balance if off. The pain is severe, 10/10 and keeps her from sleeping at night. Symptoms are continuous and never subside, but are worse at night.  Reviewed notes, labs and imaging from outside physicians, which showed: 1) Concurrent chemoradiation with weekly chemotherapy in the form of carboplatin for AUC of 2 and paclitaxel 45 mg/m2. Status post 7 week of treatment. Last dose was given 01/27/2014 no significant response in her disease.  2) Consolidation  chemotherapy with carboplatin for AUC of 5 and paclitaxel 175 mg/M2 every 3 weeks with Neulasta support. First dose on 04/15/2014. Status post 3 cycles. Carboplatin was discontinued starting cycle #2 secondary to hypersensitivity reaction.  Her restaging scan showed evidence for disease progression and the patient is currently undergoing immunotherapy with Nivolumab 3 mg/KG every 2 weeks is status post 3 cycle  Review of Systems: Patient complains of symptoms per HPI as well as the following symptoms: Cough, wheezing, shortness of breath, numbness, depression. Pertinent negatives per HPI. All others negative.   Social History   Social History  . Marital status: Divorced    Spouse name: N/A  . Number of children: 0  . Years of education: MA   Occupational History  . Retired Other   Social History Main Topics  . Smoking status: Former Smoker    Packs/day: 3.00    Years: 45.00    Types: Cigarettes    Quit date: 12/05/1997  . Smokeless tobacco: Never Used  . Alcohol use No  . Drug use: No  . Sexual activity: Not  Currently   Other Topics Concern  . Not on file   Social History Narrative   Patient lives at home alone.   Caffeine Use: 2 cups daily    Family History  Problem Relation Age of Onset  . Other Mother   . Other Father     failure to thrive  . Hypertension Other   . Stroke Other   . Heart attack Other   . Hemachromatosis Other   . Rheum arthritis      both sets of grandparents and father    Past Medical History:  Diagnosis Date  . Cancer ([place]) dx'd 09/2013   Lung ca  . Diabetes ([place]) 09/08/2016   type I diabetic patient reported on 09/13/16   . Diabetes mellitus    insulin  . Diabetic neuropathy ([place]) 09/08/2016  . GERD (gastroesophageal reflux disease)    no meds for  . History of hiatal hernia   . Hx of radiation therapy 12/16/13-01/30/14   lung 66Gy  . Hypertension   . Malignant neoplasm of right upper lobe of lung ([place]) 11/28/2013  . Neuropathy ([place])    . Psoriasis   . Psoriatic arthritis ([place]) 09/08/2016  . Rheumatoid arthritis ([place])   . Shortness of breath dyspnea    with exertion    Past Surgical History:  Procedure Laterality Date  . ABDOMINAL HYSTERECTOMY    . ABDOMINAL SURGERY    . BALLOON DILATION N/A 12/12/2014   Procedure: BALLOON DILATION;  Surgeon: Inda Castle, MD;  Location: [facility];  Service: Endoscopy;  Laterality: N/A;  . CHOLECYSTECTOMY    . COLONOSCOPY N/A 02/07/2016   Procedure: COLONOSCOPY;  Surgeon: Doran Stabler, MD;  Location: [facility];  Service: Endoscopy;  Laterality: N/A;  . DILATION AND CURETTAGE OF UTERUS    . ESOPHAGOGASTRODUODENOSCOPY N/A 12/12/2014   Procedure: ESOPHAGOGASTRODUODENOSCOPY (EGD);  Surgeon: Inda Castle, MD;  Location: [facility];  Service: Endoscopy;  Laterality: N/A;  with dilation  . ESOPHAGOGASTRODUODENOSCOPY (EGD) WITH PROPOFOL N/A 10/09/2014   Procedure: ESOPHAGOGASTRODUODENOSCOPY (EGD) WITH PROPOFOL;  Surgeon: Inda Castle, MD;  Location: [facility] [place];  Service: Endoscopy;  Laterality: N/A;  . ESOPHAGOGASTRODUODENOSCOPY (EGD) WITH PROPOFOL N/A 09/19/2016   Procedure: ESOPHAGOGASTRODUODENOSCOPY (EGD) WITH PROPOFOL;  Surgeon: Doran Stabler, MD;  Location: [facility];  Service: Gastroenterology;  Laterality: N/A;  with savary dil tt  . FRACTURE SURGERY    . ORIF ANKLE FRACTURE Right 07/04/2015   Procedure: OPEN REDUCTION INTERNAL FIXATION (ORIF) ANKLE FRACTURE;  Surgeon: Newt Minion, MD;  Location: [place];  Service: Orthopedics;  Laterality: Right;  OPEN REDUCTION, INTERNAL FIXATION OF RIGHT ANKLE FRACTURE.   [name] ORIF ANKLE FRACTURE Left 02/10/2016   Procedure: OPEN REDUCTION INTERNAL FIXATION (ORIF) ANKLE FRACTURE;  Surgeon: Newt Minion, MD;  Location: [place];  Service: Orthopedics;  Laterality: Left;  . SAVORY DILATION N/A 10/09/2014   Procedure: SAVORY DILATION;  Surgeon: Inda Castle, MD;  Location: [place];  Service: Endoscopy;  Laterality: N/A;  .  SAVORY DILATION N/A 09/19/2016   Procedure: SAVORY DILATION;  Surgeon: Doran Stabler, MD;  Location: [facility];  Service: Gastroenterology;  Laterality: N/A;  . TONSILLECTOMY      Current Outpatient Prescriptions  Medication Sig Dispense Refill  . B-D UF III MINI PEN NEEDLES 31G X 5 MM MISC U TO CHECK SUGAR FID  3  . Budesonide (UCERIS) 9 MG TB24 Take 3 mg by mouth daily. 90 tablet 1  . clobetasol cream (  TEMOVATE) 2.99 % Apply 1 application topically 2 (two) times daily. 45 g 1  . DULoxetine (CYMBALTA) 60 MG capsule TAKE ONE CAPSULE BY MOUTH TWICE DAILY 60 capsule 0  . fluticasone-salmeterol (ADVAIR HFA) 115-21 MCG/ACT inhaler Inhale 2 puffs into the lungs 2 (two) times daily as needed.     [name] HORIZANT 600 MG TBCR TAKE (600 MG) 1 TABLET TWICE DAILY  6  . HORIZANT 600 MG TBCR TAKE 2 TABLETS DAILY AS DIRECTED 60 tablet 0  . Insulin Detemir (LEVEMIR FLEXPEN) 100 UNIT/ML Pen Inject 30 units into the skin at bedtime. 10 pen 2  . insulin lispro (HUMALOG KWIKPEN) 100 UNIT/ML KiwkPen INJECT 8-20 units 3x a day under skin (Patient taking differently: Inject 8-20 Units into the skin 4 (four) times daily. Per patient sliding scale.) 30 mL 5  . Insulin Pen Needle 32G X 5 MM MISC Used to check sugar 5 times daily. 100 each 3  . ketoconazole (NIZORAL) 2 % cream Apply 1 application topically daily. Apply cream to rash/psoriasis to sacrum daily. Apply after TEMOVATE cream.    . lisinopril (PRINIVIL,ZESTRIL) 10 MG tablet Take 1 tablet (10 mg total) by mouth daily. 30 tablet 0  . ONETOUCH DELICA LANCETS FINE MISC USE TO TEST BLOOD SUGAR FIVE TIMES DAILY AS NEEDED 200 each 11  . ONETOUCH VERIO test strip Use 5x a day 300 each 11  . oxyCODONE-acetaminophen (PERCOCET/ROXICET) 5-325 MG tablet TAKE 1 TABLET BY MOUTH EVERY4-6 HOURS AS NEEDED FOR PAIN.  0  . potassium chloride SA (K-DUR,KLOR-CON) 20 MEQ tablet Take 20 mEq by mouth 2 (two) times daily.    . predniSONE (DELTASONE) 20 MG tablet Take 2 tablets (40  mg total) by mouth daily with breakfast. 10 tablet 0  . solifenacin (VESICARE) 5 MG tablet Take 5 mg by mouth at bedtime.     . sulfaSALAzine (AZULFIDINE) 500 MG EC tablet Take 2 tablets (1,000 mg total) by mouth 2 (two) times daily. 120 tablet 2   No current facility-administered medications for this visit.    Facility-Administered Medications Ordered in Other Visits  Medication Dose Route Frequency Provider Last Rate Last Dose  . sodium chloride 0.9 % injection 10 mL  10 mL Intracatheter PRN Curt Bears, MD        Allergies as of 10/26/2016 - Review Complete 10/26/2016  Allergen Reaction Noted  . Carboplatin Other (See Comments) 05/29/2014  . Remicade [infliximab] Anaphylaxis 05/13/2012  . Hydromorphone Rash 08/11/2014    Vitals: BP 127/77 (BP Location: Right Arm, Patient Position: Sitting, Cuff Size: Normal)   Pulse (!) 118   Ht '5\' 5"'$  (1.651 m)   Wt 158 lb 3.2 oz (71.8 kg)   BMI 26.33 kg/m  Last Weight:  Wt Readings from Last 1 Encounters:  10/26/16 158 lb 3.2 oz (71.8 kg)   Last Height:   Ht Readings from Last 1 Encounters:  10/26/16 '5\' 5"'$  (1.651 m)    Sensation (stable): Absent vibration and proprioception in the distal extremities. Impaired pp and temp in the distal extremities. Severe Allodynia and hyperesthesias bottom of the feet. +Romberg.   Reflex Exam: Absent ankle jerks   Assessment/Plan: This is a wonderful 75 year old white female with history of stage IIIB non-small cell lung cancer status postcarboplatin and paclitaxel with resultant severe neuropathy. Exam shows severe loss of sensation in the distal extremities, +Romberg, Ataxic gait and Allodynia and hyperesthesias. Very distressing to patient, . Keeps her up at night with pain. Gralise has helped with the  pain during the day. But she would like to try Lyrica.  1. Will stop Gralise '1800mg'$  daily and start Lyrica 2. We'll continue the lidocaine topical and patches. 3. Continue Cymbalta  4. 1  year follow up or sooner if needed    Sarina Ill, MD  [facility] [address] [place], [place] [address]  Phone [phone] Fax [phone]  A total of 25 minutes was spent face-to-face with this patient. Over half this time was spent on counseling patient on the polyneuropathy diagnosis and different diagnostic and therapeutic options available.

## 2016-10-26 NOTE — Patient Instructions (Addendum)
As far as your medications are concerned, I would like to suggest: Stop [place], start [place] phone number is [phone]. We also have an after hours call service for urgent matters and there is a physician on-call for urgent questions. For any emergencies you know to call 911 or go to the nearest emergency room  Pregabalin capsules What is this medicine? PREGABALIN (pre GAB a lin) is used to treat nerve pain from diabetes, shingles, spinal cord injury, and fibromyalgia. It is also used to control seizures in epilepsy. This medicine may be used for other purposes; ask your health care provider or pharmacist if you have questions. COMMON BRAND NAME(S): Lyrica What should I tell my health care provider before I take this medicine? They need to know if you have any of these conditions: -bleeding problems -heart disease, including heart failure -history of alcohol or drug abuse -kidney disease -suicidal thoughts, plans, or attempt; a previous suicide attempt by you or a family member -an unusual or allergic reaction to pregabalin, gabapentin, other medicines, foods, dyes, or preservatives -pregnant or trying to get pregnant or trying to conceive with your partner -breast-feeding How should I use this medicine? Take this medicine by mouth with a glass of water. Follow the directions on the prescription label. You can take this medicine with or without food. Take your doses at regular intervals. Do not take your medicine more often than directed. Do not stop taking except on your doctor's advice. A special MedGuide will be given to you by the pharmacist with each prescription and refill. Be sure to read this information carefully each time. Talk to your pediatrician regarding the use of this medicine in children. Special care may be needed. Overdosage: If you think you have taken too much of this medicine contact a poison control center or emergency room at once. NOTE: This medicine is only  for you. Do not share this medicine with others. What if I miss a dose? If you miss a dose, take it as soon as you can. If it is almost time for your next dose, take only that dose. Do not take double or extra doses. What may interact with this medicine? -alcohol -certain medicines for blood pressure like captopril, enalapril, or lisinopril -certain medicines for diabetes, like pioglitazone or rosiglitazone -certain medicines for anxiety or sleep -narcotic medicines for pain This list may not describe all possible interactions. Give your health care provider a list of all the medicines, herbs, non-prescription drugs, or dietary supplements you use. Also tell them if you smoke, drink alcohol, or use illegal drugs. Some items may interact with your medicine. What should I watch for while using this medicine? Tell your doctor or healthcare professional if your symptoms do not start to get better or if they get worse. Visit your doctor or health care professional for regular checks on your progress. Do not stop taking except on your doctor's advice. You may develop a severe reaction. Your doctor will tell you how much medicine to take. Wear a medical identification bracelet or chain if you are taking this medicine for seizures, and carry a card that describes your disease and details of your medicine and dosage times. You may get drowsy or dizzy. Do not drive, use machinery, or do anything that needs mental alertness until you know how this medicine affects you. Do not stand or sit up quickly, especially if you are an older patient. This reduces the risk of dizzy or fainting spells. Alcohol may  interfere with the effect of this medicine. Avoid alcoholic drinks. If you have a heart condition, like congestive heart failure, and notice that you are retaining water and have swelling in your hands or feet, contact your health care provider immediately. The use of this medicine may increase the chance of  suicidal thoughts or actions. Pay special attention to how you are responding while on this medicine. Any worsening of mood, or thoughts of suicide or dying should be reported to your health care professional right away. This medicine has caused reduced sperm counts in some men. This may interfere with the ability to father a child. You should talk to your doctor or health care professional if you are concerned about your fertility. Women who become pregnant while using this medicine for seizures may enroll in the [place] Pregnancy Registry by calling [phone]. This registry collects information about the safety of antiepileptic drug use during pregnancy. What side effects may I notice from receiving this medicine? Side effects that you should report to your doctor or health care professional as soon as possible: -allergic reactions like skin rash, itching or hives, swelling of the face, lips, or tongue -breathing problems -changes in vision -chest pain -confusion -jerking or unusual movements of any part of your body -loss of memory -muscle pain, tenderness, or weakness -suicidal thoughts or other mood changes -swelling of the ankles, feet, hands -unusual bruising or bleeding Side effects that usually do not require medical attention (report to your doctor or health care professional if they continue or are bothersome): -dizziness -drowsiness -dry mouth -headache -nausea -tremors -trouble sleeping -weight gain This list may not describe all possible side effects. Call your doctor for medical advice about side effects. You may report side effects to FDA at 1-800-FDA-1088. Where should I keep my medicine? Keep out of the reach of children. This medicine can be abused. Keep your medicine in a safe place to protect it from theft. Do not share this medicine with anyone. Selling or giving away this medicine is dangerous and against the law. This medicine may  cause accidental overdose and death if it taken by other adults, children, or pets. Mix any unused medicine with a substance like cat litter or coffee grounds. Then throw the medicine away in a sealed container like a sealed bag or a coffee can with a lid. Do not use the medicine after the expiration date. Store at room temperature between 15 and 30 degrees C (59 and 86 degrees F). NOTE: This sheet is a summary. It may not cover all possible information. If you have questions about this medicine, talk to your doctor, pharmacist, or health care provider.  2017 [employer] Standard (2015-11-19 10:26:12)  Lorazepam tablets What is this medicine? LORAZEPAM (lor A ze pam) is a benzodiazepine. It is used to treat anxiety. This medicine may be used for other purposes; ask your health care provider or pharmacist if you have questions. COMMON BRAND NAME(S): Ativan What should I tell my health care provider before I take this medicine? They need to know if you have any of these conditions: -glaucoma -history of drug or alcohol abuse problem -kidney disease -liver disease -lung or breathing disease, like asthma -mental illness -myasthenia gravis -Parkinson's disease -suicidal thoughts, plans, or attempt; a previous suicide attempt by you or a family member -an unusual or allergic reaction to lorazepam, other medicines, foods, dyes, or preservatives -pregnant or trying to get pregnant -breast-feeding How should I use this medicine? Take this  medicine by mouth with a glass of water. Follow the directions on the prescription label. Take your medicine at regular intervals. Do not take it more often than directed. Do not stop taking except on your doctor's advice. A special MedGuide will be given to you by the pharmacist with each prescription and refill. Be sure to read this information carefully each time. Talk to your pediatrician regarding the use of this medicine in children. While this drug may  be used in children as young as 12 years for selected conditions, precautions do apply. Overdosage: If you think you have taken too much of this medicine contact a poison control center or emergency room at once. NOTE: This medicine is only for you. Do not share this medicine with others. What if I miss a dose? If you miss a dose, take it as soon as you can. If it is almost time for your next dose, take only that dose. Do not take double or extra doses. What may interact with this medicine? Do not take this medicine with any of the following medications: -narcotic medicines for cough -sodium oxybate This medicine may also interact with the following medications: -alcohol -antihistamines for allergy, cough and cold -certain medicines for anxiety or sleep -certain medicines for depression, like amitriptyline, fluoxetine, sertraline -certain medicines for seizures like carbamazepine, phenobarbital, phenytoin, primidone -general anesthetics like lidocaine, pramoxine, tetracaine -MAOIs like Carbex, Eldepryl, Marplan, Nardil, and Parnate -medicines that relax muscles for surgery -narcotic medicines for pain -phenothiazines like chlorpromazine, mesoridazine, prochlorperazine, thioridazine This list may not describe all possible interactions. Give your health care provider a list of all the medicines, herbs, non-prescription drugs, or dietary supplements you use. Also tell them if you smoke, drink alcohol, or use illegal drugs. Some items may interact with your medicine. What should I watch for while using this medicine? Tell your doctor or health care professional if your symptoms do not start to get better or if they get worse. Do not stop taking except on your doctor's advice. You may develop a severe reaction. Your doctor will tell you how much medicine to take. You may get drowsy or dizzy. Do not drive, use machinery, or do anything that needs mental alertness until you know how this medicine  affects you. To reduce the risk of dizzy and fainting spells, do not stand or sit up quickly, especially if you are an older patient. Alcohol may increase dizziness and drowsiness. Avoid alcoholic drinks. If you are taking another medicine that also causes drowsiness, you may have more side effects. Give your health care provider a list of all medicines you use. Your doctor will tell you how much medicine to take. Do not take more medicine than directed. Call emergency for help if you have problems breathing or unusual sleepiness. What side effects may I notice from receiving this medicine? Side effects that you should report to your doctor or health care professional as soon as possible: -allergic reactions like skin rash, itching or hives, swelling of the face, lips, or tongue -breathing problems -confusion -loss of balance or coordination -signs and symptoms of low blood pressure like dizziness; feeling faint or lightheaded, falls; unusually weak or tired -suicidal thoughts or other mood changes Side effects that usually do not require medical attention (report to your doctor or health care professional if they continue or are bothersome): -dizziness -headache -nausea, vomiting -tiredness This list may not describe all possible side effects. Call your doctor for medical advice about side effects. You may  report side effects to FDA at 1-800-FDA-1088. Where should I keep my medicine? Keep out of the reach of children. This medicine can be abused. Keep your medicine in a safe place to protect it from theft. Do not share this medicine with anyone. Selling or giving away this medicine is dangerous and against the law. This medicine may cause accidental overdose and death if taken by other adults, children, or pets. Mix any unused medicine with a substance like cat litter or coffee grounds. Then throw the medicine away in a sealed container like a sealed bag or a coffee can with a lid. Do not use the  medicine after the expiration date. Store at room temperature between 20 and 25 degrees C (68 and 77 degrees F). Protect from light. Keep container tightly closed. NOTE: This sheet is a summary. It may not cover all possible information. If you have questions about this medicine, talk to your doctor, pharmacist, or health care provider.  2017 [employer]vier/Gold Standard (2015-07-16 15:54:27)

## 2016-10-26 NOTE — Progress Notes (Signed)
Faxed printed rx ativan and lyrica to pt pharmacy. Fax: [phone]. Received confirmation.

## 2016-10-27 ENCOUNTER — Other Ambulatory Visit: Payer: Self-pay

## 2016-10-27 MED ORDER — BUDESONIDE 9 MG PO TB24: 9.0000 mg | ORAL_TABLET | Freq: Every day | ORAL | 1 refills | Status: DC

## 2016-10-27 NOTE — Telephone Encounter (Signed)
Received fax from [facility] [address] for clarification of Uceris. Per Dr Loletha Carrow' ov note, pt should be on '9mg'$  of Uceris qd. Rx changed from '3mg'$  qd  to '9mg'$  qd and sent electronically.  Pharmacy informed by fax as well.

## 2016-10-28 NOTE — Telephone Encounter (Signed)
Patient has been advised of office visit 12/12/16 as well as to take Uceris once daily x 2 months. She verbalizes understanding of this.

## 2016-10-31 DIAGNOSIS — S060X9A Concussion with loss of consciousness of unspecified duration, initial encounter: Secondary | ICD-10-CM

## 2016-10-31 DIAGNOSIS — W19XXXA Unspecified fall, initial encounter: Secondary | ICD-10-CM

## 2016-10-31 DIAGNOSIS — S060XAA Concussion with loss of consciousness status unknown, initial encounter: Secondary | ICD-10-CM

## 2016-10-31 HISTORY — DX: Concussion with loss of consciousness of unspecified duration, initial encounter: S06.0X9A

## 2016-10-31 HISTORY — DX: Unspecified fall, initial encounter: W19.XXXA

## 2016-10-31 HISTORY — DX: Concussion with loss of consciousness status unknown, initial encounter: S06.0XAA

## 2016-11-03 ENCOUNTER — Other Ambulatory Visit (HOSPITAL_BASED_OUTPATIENT_CLINIC_OR_DEPARTMENT_OTHER): Payer: Medicare Other

## 2016-11-03 ENCOUNTER — Ambulatory Visit (HOSPITAL_COMMUNITY)
Admission: RE | Admit: 2016-11-03 | Discharge: 2016-11-03 | Disposition: A | Discharge status: Home / Self Care | Payer: Medicare Other | Source: Ambulatory Visit | Attending: Internal Medicine | Admitting: Internal Medicine

## 2016-11-03 ENCOUNTER — Encounter (HOSPITAL_COMMUNITY): Payer: Self-pay

## 2016-11-03 DIAGNOSIS — J701 Chronic and other pulmonary manifestations due to radiation: Secondary | ICD-10-CM | POA: Insufficient documentation

## 2016-11-03 DIAGNOSIS — C3411 Malignant neoplasm of upper lobe, right bronchus or lung: Secondary | ICD-10-CM

## 2016-11-03 DIAGNOSIS — I700 Atherosclerosis of aorta: Secondary | ICD-10-CM | POA: Diagnosis not present

## 2016-11-03 DIAGNOSIS — I2510 Atherosclerotic heart disease of native coronary artery without angina pectoris: Secondary | ICD-10-CM | POA: Diagnosis not present

## 2016-11-03 LAB — COMPREHENSIVE METABOLIC PANEL
ALBUMIN: 3.4 g/dL — AB (ref 3.5–5.0)
ALK PHOS: 213 U/L — AB (ref 40–150)
ALT: 20 U/L (ref 0–55)
ANION GAP: 9 meq/L (ref 3–11)
AST: 18 U/L (ref 5–34)
BUN: 11.6 mg/dL (ref 7.0–26.0)
CALCIUM: 9.3 mg/dL (ref 8.4–10.4)
CHLORIDE: 107 meq/L (ref 98–109)
CO2: 27 mEq/L (ref 22–29)
Creatinine: 0.7 mg/dL (ref 0.6–1.1)
EGFR: 81 mL/min/{1.73_m2} — ABNORMAL LOW (ref >90)
Glucose: 112 mg/dl (ref 70–140)
POTASSIUM: 3.9 meq/L (ref 3.5–5.1)
Sodium: 143 mEq/L (ref 136–145)
Total Bilirubin: 0.31 mg/dL (ref 0.20–1.20)
Total Protein: 6.2 g/dL — ABNORMAL LOW (ref 6.4–8.3)

## 2016-11-03 LAB — CBC WITH DIFFERENTIAL/PLATELET
BASO%: 1.2 % (ref 0.0–2.0)
BASOS ABS: 0.1 10*3/uL (ref 0.0–0.1)
EOS ABS: 0.1 10*3/uL (ref 0.0–0.5)
EOS%: 1.7 % (ref 0.0–7.0)
HEMATOCRIT: 40.4 % (ref 34.8–46.6)
HEMOGLOBIN: 13 g/dL (ref 11.6–15.9)
LYMPH#: 0.8 10*3/uL — AB (ref 0.9–3.3)
LYMPH%: 10.3 % — ABNORMAL LOW (ref 14.0–49.7)
MCH: 26.9 pg (ref 25.1–34.0)
MCHC: 32 g/dL (ref 31.5–36.0)
MCV: 84.1 fL (ref 79.5–101.0)
MONO#: 0.6 10*3/uL (ref 0.1–0.9)
MONO%: 7.4 % (ref 0.0–14.0)
NEUT#: 6.2 10*3/uL (ref 1.5–6.5)
NEUT%: 79.4 % — AB (ref 38.4–76.8)
PLATELETS: 225 10*3/uL (ref 145–400)
RBC: 4.81 10*6/uL (ref 3.70–5.45)
RDW: 19 % — ABNORMAL HIGH (ref 11.2–14.5)
WBC: 7.8 10*3/uL (ref 3.9–10.3)

## 2016-11-03 LAB — TECHNOLOGIST REVIEW

## 2016-11-10 ENCOUNTER — Telehealth: Payer: Self-pay | Admitting: Internal Medicine

## 2016-11-10 ENCOUNTER — Encounter: Payer: Self-pay | Admitting: Internal Medicine

## 2016-11-10 ENCOUNTER — Ambulatory Visit (HOSPITAL_BASED_OUTPATIENT_CLINIC_OR_DEPARTMENT_OTHER): Payer: Medicare Other | Admitting: Internal Medicine

## 2016-11-10 VITALS — BP 95/60 | HR 109 | Temp 97.9°F | Resp 16 | Ht 65.0 in | Wt 159.1 lb

## 2016-11-10 DIAGNOSIS — G629 Polyneuropathy, unspecified: Secondary | ICD-10-CM

## 2016-11-10 DIAGNOSIS — C3411 Malignant neoplasm of upper lobe, right bronchus or lung: Secondary | ICD-10-CM

## 2016-11-10 DIAGNOSIS — Z85118 Personal history of other malignant neoplasm of bronchus and lung: Secondary | ICD-10-CM

## 2016-11-10 DIAGNOSIS — M1990 Unspecified osteoarthritis, unspecified site: Secondary | ICD-10-CM | POA: Diagnosis not present

## 2016-11-10 DIAGNOSIS — M069 Rheumatoid arthritis, unspecified: Secondary | ICD-10-CM | POA: Diagnosis not present

## 2016-11-10 NOTE — Telephone Encounter (Signed)
Reviewed appts and gave avs.

## 2016-11-10 NOTE — Progress Notes (Signed)
[place] [phone]   [phone]  OFFICE PROGRESS NOTE  Binnie Rail, MD [place] [address]  DIAGNOSIS: Squamous cell lung cancer  Primary site: Lung (Right)  Staging method: AJCC 7th Edition  Clinical: Stage IIIB (T3, N3, M0)  Summary: Stage IIIB (T3, N3, M0)   PRIOR THERAPY:  1) Concurrent chemoradiation with weekly chemotherapy in the form of carboplatin for AUC of 2 and paclitaxel 45 mg/m2. Status post 7 week of treatment. Last dose was given 01/27/2014 no significant response in her disease.  2) Consolidation chemotherapy with carboplatin for AUC of 5 and paclitaxel 175 mg/M2 every 3 weeks with Neulasta support. First dose on 04/15/2014. Status post 3 cycles. Carboplatin was discontinued starting cycle #2 secondary to hypersensitivity reaction. 3) Immunotherapy with Opdivo (Nivolumab) '3mg'$ /kg given every 2 weeks. Status post 33 cycles discontinued secondary to intolerance.  CURRENT THERAPY: Observation.  CHEMOTHERAPY INTENT: Control/Palliative  CURRENT # OF CHEMOTHERAPY CYCLES: 0 CURRENT ANTIEMETICS: Zofran, dexamethasone, Compazine  CURRENT SMOKING STATUS: Former smoker, quit 12/05/1997  ORAL CHEMOTHERAPY AND CONSENT: n/a  CURRENT BISPHOSPHONATES USE: None  PAIN MANAGEMENT: no cancer related pain, patient does have RA  NARCOTICS INDUCED CONSTIPATION: none  LIVING WILL AND CODE STATUS: ?  INTERVAL HISTORY: Megan [name] 76 y.o. female came to the clinic today for three-month follow-up visit. The patient is feeling very well today. She continues to have mild peripheral neuropathy as well as arthritis secondary to her rheumatoid arthritis. She also has some issues with her hyperglycemia but this is monitored closely by her primary care physician. She denied having any chest pain but has shortness breath with exertion with mild cough. She has no fever or chills. She has no weight loss or night  sweats. She feels very well and she has been off treatment for more than one year. She had repeat CT scan of the chest performed recently and she is here for evaluation and discussion of her scan results.  MEDICAL HISTORY: Past Medical History:  Diagnosis Date  . Cancer ([place]) dx'd 09/2013   Lung ca  . Diabetes ([place]) 09/08/2016   type I diabetic patient reported on 09/13/16   . Diabetes mellitus    insulin  . Diabetic neuropathy ([place]) 09/08/2016  . GERD (gastroesophageal reflux disease)    no meds for  . History of hiatal hernia   . Hx of radiation therapy 12/16/13-01/30/14   lung 66Gy  . Hypertension   . Malignant neoplasm of right upper lobe of lung ([place]) 11/28/2013  . Neuropathy ([place])   . Psoriasis   . Psoriatic arthritis ([place]) 09/08/2016  . Rheumatoid arthritis ([place])   . Shortness of breath dyspnea    with exertion    ALLERGIES:  is allergic to carboplatin; remicade [infliximab]; and hydromorphone.  MEDICATIONS:  Current Outpatient Prescriptions  Medication Sig Dispense Refill  . B-D UF III MINI PEN NEEDLES 31G X 5 MM MISC U TO CHECK SUGAR FID  3  . Budesonide (UCERIS) 9 MG TB24 Take 9 mg by mouth daily. Pt should take '9mg'$  once daily 90 tablet 1  . clobetasol cream (TEMOVATE) 2.69 % Apply 1 application topically 2 (two) times daily. 45 g 1  . DULoxetine (CYMBALTA) 60 MG capsule Take 1 capsule (60 mg total) by mouth 2 (two) times daily. 60 capsule 12  . fluticasone-salmeterol (ADVAIR HFA) 115-21 MCG/ACT inhaler Inhale 2 puffs into the lungs 2 (two) times daily as needed.     [name]  Insulin Detemir (LEVEMIR FLEXPEN) 100 UNIT/ML Pen Inject 30 units into the skin at bedtime. 10 pen 2  . insulin lispro (HUMALOG KWIKPEN) 100 UNIT/ML KiwkPen INJECT 8-20 units 3x a day under skin (Patient taking differently: Inject 8-20 Units into the skin 4 (four) times daily. Per patient sliding scale.) 30 mL 5  . Insulin Pen Needle 32G X 5 MM MISC Used to check sugar 5 times daily. 100 each 3  .  ketoconazole (NIZORAL) 2 % cream Apply 1 application topically daily. Apply cream to rash/psoriasis to sacrum daily. Apply after TEMOVATE cream.    . LORazepam (ATIVAN) 0.5 MG tablet Take one tablet at onset of panic attack, may repeat in 30 minutes to one hour if needed 30 tablet 3  . ONETOUCH DELICA LANCETS FINE MISC USE TO TEST BLOOD SUGAR FIVE TIMES DAILY AS NEEDED 200 each 11  . ONETOUCH VERIO test strip Use 5x a day 300 each 11  . oxyCODONE-acetaminophen (PERCOCET/ROXICET) 5-325 MG tablet TAKE 1 TABLET BY MOUTH EVERY4-6 HOURS AS NEEDED FOR PAIN.  0  . pregabalin (LYRICA) 150 MG capsule Take 1 capsule (150 mg total) by mouth 2 (two) times daily. 60 capsule 5  . solifenacin (VESICARE) 5 MG tablet Take 5 mg by mouth at bedtime.     . sulfaSALAzine (AZULFIDINE) 500 MG EC tablet Take 2 tablets (1,000 mg total) by mouth 2 (two) times daily. 120 tablet 2   No current facility-administered medications for this visit.    Facility-Administered Medications Ordered in Other Visits  Medication Dose Route Frequency Provider Last Rate Last Dose  . sodium chloride 0.9 % injection 10 mL  10 mL Intracatheter PRN Curt Bears, MD        SURGICAL HISTORY:  Past Surgical History:  Procedure Laterality Date  . ABDOMINAL HYSTERECTOMY    . ABDOMINAL SURGERY    . BALLOON DILATION N/A 12/12/2014   Procedure: BALLOON DILATION;  Surgeon: Inda Castle, MD;  Location: [facility];  Service: Endoscopy;  Laterality: N/A;  . CHOLECYSTECTOMY    . COLONOSCOPY N/A 02/07/2016   Procedure: COLONOSCOPY;  Surgeon: Doran Stabler, MD;  Location[facility] ENDOSCOPY;  Service: Endoscopy;  Laterality: N/A;  . DILATION AND CURETTAGE OF UTERUS    . ESOPHAGOGASTRODUODENOSCOPY N/A 12/12/2014   Procedure: ESOPHAGOGASTRODUODENOSCOPY (EGD);  Surgeon: Inda Castle, MD;  Location: [facility];  Service: Endoscopy;  Laterality: N/A;  with dilation  . ESOPHAGOGASTRODUODENOSCOPY (EGD) WITH PROPOFOL N/A 10/09/2014   Procedure:  ESOPHAGOGASTRODUODENOSCOPY (EGD) WITH PROPOFOL;  Surgeon: Inda Castle, MD;  Location: [facility];  Service: Endoscopy;  Laterality: N/A;  . ESOPHAGOGASTRODUODENOSCOPY (EGD) WITH PROPOFOL N/A 09/19/2016   Procedure: ESOPHAGOGASTRODUODENOSCOPY (EGD) WITH PROPOFOL;  Surgeon: Doran Stabler, MD;  Location: [facility] ENDOSCOPY;  Service: Gastroenterology;  Laterality: N/A;  with savary dil tt  . FRACTURE SURGERY    . ORIF ANKLE FRACTURE Right 07/04/2015   Procedure: OPEN REDUCTION INTERNAL FIXATION (ORIF) ANKLE FRACTURE;  Surgeon: Newt Minion, MD;  Location: [facility];  Service: Orthopedics;  Laterality: Right;  OPEN REDUCTION, INTERNAL FIXATION OF RIGHT ANKLE FRACTURE.   [name] ORIF ANKLE FRACTURE Left 02/10/2016   Procedure: OPEN REDUCTION INTERNAL FIXATION (ORIF) ANKLE FRACTURE;  Surgeon: Newt Minion, MD;  Location: [facility];  Service: Orthopedics;  Laterality: Left;  . SAVORY DILATION N/A 10/09/2014   Procedure: SAVORY DILATION;  Surgeon: Inda Castle, MD;  Location: [facility];  Service: Endoscopy;  Laterality: N/A;  . SAVORY DILATION N/A 09/19/2016  Procedure: SAVORY DILATION;  Surgeon: Doran Stabler, MD;  Location: [facility];  Service: Gastroenterology;  Laterality: N/A;  . TONSILLECTOMY      REVIEW OF SYSTEMS:  A comprehensive review of systems was negative except for: Constitutional: positive for fatigue Respiratory: positive for cough and dyspnea on exertion Musculoskeletal: positive for arthralgias   PHYSICAL EXAMINATION: General appearance: alert, cooperative and no distress Head: Normocephalic, without obvious abnormality, atraumatic Neck: no adenopathy, no JVD, supple, symmetrical, trachea midline and thyroid not enlarged, symmetric, no tenderness/mass/nodules Lymph nodes: Cervical, supraclavicular, and axillary nodes normal. Resp: clear to auscultation bilaterally Back: symmetric, no curvature. ROM normal. No CVA tenderness. Cardio: regular rate and rhythm, S1, S2 normal, no  murmur, click, rub or gallop GI: soft, non-tender; bowel sounds normal; no masses,  no organomegaly Extremities: extremities normal, atraumatic, no cyanosis or edema    ECOG PERFORMANCE STATUS: 1 - Symptomatic but completely ambulatory  Blood pressure 95/60, pulse (!) 109, temperature 97.9 F (36.6 C), temperature source Oral, resp. rate 16, height '5\' 5"'$  (1.651 m), weight 159 lb 1.6 oz (72.2 kg), SpO2 97 %.  LABORATORY DATA: Lab Results  Component Value Date   WBC 7.8 11/03/2016   HGB 13.0 11/03/2016   HCT 40.4 11/03/2016   MCV 84.1 11/03/2016   PLT 225 11/03/2016      Chemistry      Component Value Date/Time   NA 143 11/03/2016 1108   K 3.9 11/03/2016 1108   CL 101 10/01/2016 0945   CO2 27 11/03/2016 1108   BUN 11.6 11/03/2016 1108   CREATININE 0.7 11/03/2016 1108   GLU 176 03/07/2016      Component Value Date/Time   CALCIUM 9.3 11/03/2016 1108   ALKPHOS 213 (H) 11/03/2016 1108   AST 18 11/03/2016 1108   ALT 20 11/03/2016 1108   BILITOT 0.31 11/03/2016 1108       RADIOGRAPHIC STUDIES: Ct Chest Wo Contrast  Result Date: 11/03/2016 CLINICAL DATA:  Stage IIIB right upper lobe squamous cell lung carcinoma diagnosed December 2014 status post concurrent chemoradiation therapy completed March 2015 with subsequent consolidation chemotherapy and immunotherapy, currently on observation. Restaging. No acute symptoms are reported. EXAM: CT CHEST WITHOUT CONTRAST TECHNIQUE: Multidetector CT imaging of the chest was performed following the standard protocol without IV contrast. COMPARISON:  06/29/2016 chest CT. FINDINGS: Cardiovascular: Normal heart size. Stable trace pericardial effusion/ thickening. Left anterior descending coronary atherosclerosis. Atherosclerotic nonaneurysmal thoracic aorta. Normal caliber pulmonary arteries. Mediastinum/Nodes: Stable 1.3 cm partially calcified left thyroid lobe nodule. Stable mild circumferential wall thickening in the middle third of the thoracic  esophagus. No axillary adenopathy. Stable coarsely calcified left hilar nodes from prior granulomatous disease. No pathologically enlarged mediastinal or gross hilar nodes on this noncontrast study. Lungs/Pleura: No pneumothorax. No pleural effusion. Stable subcentimeter calcified peripheral left lower lobe granuloma. There is a thick bandlike consolidation in the medial parahilar right lung measuring up to the 1.9 cm thickness, with associated volume loss and architectural distortion, unchanged, compatible with stable radiation fibrosis. Minimal patchy ground-glass opacities in the basilar right lower lobe are new. Ground-glass left upper lobe 4 mm pulmonary nodule (series 4/ image 47) and ground-glass 8 x 4 mm central subpleural left upper lobe pulmonary nodule (series 4/ image 69) are both stable back to 10/25/2013 chest CT and probably benign. No acute consolidative airspace disease or new significant pulmonary nodules. Upper abdomen: Cholecystectomy. Two simple liver cysts, largest 1.9 cm in the left liver lobe. Stable granulomatous calcifications in the spleen.  Musculoskeletal: No aggressive appearing focal osseous lesions. Moderate thoracic spondylosis. IMPRESSION: 1. Stable radiation fibrosis in the medial right lung. No evidence of local tumor recurrence. 2. No evidence of metastatic disease in the chest. 3. Aortic atherosclerosis.  One vessel coronary atherosclerosis. 4. Stable nonspecific mild circumferential wall thickening in the middle third of the thoracic esophagus, probably treatment related. Electronically Signed   By: Ilona Sorrel M.D.   On: 11/03/2016 14:05    ASSESSMENT AND PLAN:  This is a very pleasant 76 years old white female with metastatic non-small cell lung cancer status post several chemotherapy regimens as well as concurrent radiation. She also status post treatment with immunotherapy with Nivolumab 433 cycles. She had significant improvement in her disease and she has been on  observation for more than a year. The patient is doing fine today. Her repeat CT scan of the chest showed stable disease with no concerning findings for metastatic or recurrent disease in the chest. I discussed the scan results with the patient today. I recommended for her to continue on observation with repeat CT scan of the chest in 3 months for restaging of her disease. She was advised to call immediately if she has any concerning symptoms in the interval. The patient voices understanding of current disease status and treatment options and is in agreement with the current care plan.  All questions were answered. The patient knows to call the clinic with any problems, questions or concerns. We can certainly see the patient much sooner if necessary. I spent 10 minutes counseling the patient face to face. The total time spent in the appointment was 15 minutes. Disclaimer: This note was dictated with voice recognition software. Similar sounding words can inadvertently be transcribed and may not be corrected upon review.

## 2016-11-11 ENCOUNTER — Ambulatory Visit (INDEPENDENT_AMBULATORY_CARE_PROVIDER_SITE_OTHER): Payer: Medicare Other | Admitting: Internal Medicine

## 2016-11-11 ENCOUNTER — Encounter: Payer: Medicare Other | Attending: Internal Medicine | Admitting: Dietician

## 2016-11-11 ENCOUNTER — Encounter: Payer: Self-pay | Admitting: Internal Medicine

## 2016-11-11 VITALS — BP 118/80 | HR 105 | Ht 66.0 in | Wt 163.0 lb

## 2016-11-11 DIAGNOSIS — E109 Type 1 diabetes mellitus without complications: Secondary | ICD-10-CM | POA: Insufficient documentation

## 2016-11-11 DIAGNOSIS — Z713 Dietary counseling and surveillance: Secondary | ICD-10-CM | POA: Insufficient documentation

## 2016-11-11 DIAGNOSIS — E139 Other specified diabetes mellitus without complications: Secondary | ICD-10-CM

## 2016-11-11 LAB — POCT GLYCOSYLATED HEMOGLOBIN (HGB A1C): Hemoglobin A1C: 8.7

## 2016-11-11 NOTE — Patient Instructions (Addendum)
Eat Breakfast, Lunch, and Dinner every day.  Include carbohydrate and protein each time.  Aim for 45-60 grams of carbohydrates with each meal. Have a snack with no more than 15 grams of carbohydrates and a protein when you are hungry. Work on improving the nutrition quality of your diet. If you are unable to eat a meal then drink a Glucerna or Boost Glucose Control.

## 2016-11-11 NOTE — Progress Notes (Signed)
Patient ID: LONA SIX, female   DOB: [birth year], 76 y.o.   MRN: [id number]  HPI: Megan [name] is a 76 y.o.-year-old female, returning for f/u for DM2, dx in 2013, and as LADA in 04/2016, insulin-dependent since 2014, uncontrolled, with complications (DKA, PN). Last visit 3 mo ago.  She has colitis >> will call her GI dr.  Reviewed hx: She has a complicated medical history, with a diagnosis of ulcerative colitis with significant diarrhea that caused dehydration in the past and episodes of DKA. She had to recent admissions for this in 02/26/2016 and 04/14/2016. While in rehabilitation, her CBGs dropped to 30s on her previous dose of insulin, therefore, her long-acting insulin was decreased. This caused her CBGs to stay in the 200s.  She also has a history of lung cancer diagnosed 09/2013. She had chemotherapy and radiation therapy for this, now immunotherapy. She has malignant cachexia.  Last hemoglobin A1c was: Lab Results  Component Value Date   HGBA1C 8.3 08/12/2016   HGBA1C 10.5 05/04/2016   HGBA1C 12.8 (H) 02/11/2016  12/30/2015: 12% 09/18/2015: 11.3% 06/17/2015: 7.5% 04/08/2015: 10.9% 09/10/2014: 8.5%  11/04/2013: 6.2%  At last visit, we adjusted to: - Levemir 20 units at bedtime. - Humalog mealtime dose: insulin to carb ratio 1:7 >> could not do the calculations >> using 8-23 units per meal  Please do not skip the insulin if sugars are 70-100.   If the sugars are <70, take 1/2 of the Humalog dose. - Humalog Sliding scale: 150-200: + 2 unit 201-250: + 4 units 251-300: + 6 units 301-350: + 8 units >350: + 9 units  Pt checks her sugars 3x a day and they are still very variable: - am: 68, 71-271, 360 >> 87-291 >> 89-269, 318, 533 >> 93-281, 313 - 2h after b'fast: n/c >> 171-528 >> n/c - before lunch: n/c >> 113-260 >> 111, 162-470 >> 114, 122, 187-453 (snack before lunch) - 2h after lunch: n/c >> 134-439 >> n/c - before dinner: n/c >> 99-600 >> 70, 96-447 >>  121-464 - 2h after dinner: n/c >> 128-479 >> n/c  - bedtime: n/c >> 306-594 >> 145-383 >> 84, 166-573 - nighttime: n/c >> 57 x1 No lows. Lowest sugar was 30 >> 68 >> 70 >> 57; she has hypoglycemia awareness at 50-60.  Highest sugar was 533 >> 573 .  Pt's meals are: - Breakfast: Cereal or oatmeal with blueberries or eggs with sausage - Lunch: Sandwich and soup - Dinner: Meat, vegetables, small amount of starch - Snacks: 3-4 a day: Peanuts  - no CKD, last BUN/creatinine:  Lab Results  Component Value Date   BUN 11.6 11/03/2016   BUN 10 10/01/2016   CREATININE 0.7 11/03/2016   CREATININE 0.74 10/01/2016   - last set of lipids: 04/04/2013: 141/145/41/71 No results found for: CHOL, HDL, LDLCALC, LDLDIRECT, TRIG, CHOLHDL - last eye exam was in 12/2015. ? DR. Dr. Lady Gary. - + numbness and tingling in her feet. PN from [facility].  ROS: Constitutional: no weight gain, + fatigue, + hot flushes Eyes: no blurry vision, no xerophthalmia ENT: no sore throat, no nodules palpated in throat, no dysphagia/odynophagia, no hoarseness Cardiovascular: no CP/+ SOB/no palpitations/+ leg swelling Respiratory: no cough/+ SOB Gastrointestinal: no N/V/D/C Musculoskeletal: no muscle/+ joint aches Skin: no rashes Neurological: no tremors/numbness/tingling/no dizziness  I reviewed pt's medications, allergies, PMH, social hx, family hx, and changes were documented in the history of present illness. Otherwise, unchanged from my initial visit note.  Past Medical History:  Diagnosis Date  . Cancer ([place]) dx'd 09/2013   Lung ca  . Diabetes ([place]) 09/08/2016   type I diabetic patient reported on 09/13/16   . Diabetes mellitus    insulin  . Diabetic neuropathy ([place]) 09/08/2016  . GERD (gastroesophageal reflux disease)    no meds for  . History of hiatal hernia   . Hx of radiation therapy 12/16/13-01/30/14   lung 66Gy  . Hypertension   . Malignant neoplasm of right upper lobe of lung ([place]) 11/28/2013  .  Neuropathy ([place])   . Psoriasis   . Psoriatic arthritis ([place]) 09/08/2016  . Rheumatoid arthritis ([place])   . Shortness of breath dyspnea    with exertion   Past Surgical History:  Procedure Laterality Date  . ABDOMINAL HYSTERECTOMY    . ABDOMINAL SURGERY    . BALLOON DILATION N/A 12/12/2014   Procedure: BALLOON DILATION;  Surgeon: Inda Castle, MD;  Location: [facility];  Service: Endoscopy;  Laterality: N/A;  . CHOLECYSTECTOMY    . COLONOSCOPY N/A 02/07/2016   Procedure: COLONOSCOPY;  Surgeon: Doran Stabler, MD;  Location: [facility];  Service: Endoscopy;  Laterality: N/A;  . DILATION AND CURETTAGE OF UTERUS    . ESOPHAGOGASTRODUODENOSCOPY N/A 12/12/2014   Procedure: ESOPHAGOGASTRODUODENOSCOPY (EGD);  Surgeon: Inda Castle, MD;  Location: [facility];  Service: Endoscopy;  Laterality: N/A;  with dilation  . ESOPHAGOGASTRODUODENOSCOPY (EGD) WITH PROPOFOL N/A 10/09/2014   Procedure: ESOPHAGOGASTRODUODENOSCOPY (EGD) WITH PROPOFOL;  Surgeon: Inda Castle, MD;  Location: [facility];  Service: Endoscopy;  Laterality: N/A;  . ESOPHAGOGASTRODUODENOSCOPY (EGD) WITH PROPOFOL N/A 09/19/2016   Procedure: ESOPHAGOGASTRODUODENOSCOPY (EGD) WITH PROPOFOL;  Surgeon: Doran Stabler, MD;  Location: [facility];  Service: Gastroenterology;  Laterality: N/A;  with savary dil tt  . FRACTURE SURGERY    . ORIF ANKLE FRACTURE Right 07/04/2015   Procedure: OPEN REDUCTION INTERNAL FIXATION (ORIF) ANKLE FRACTURE;  Surgeon: Newt Minion, MD;  Location: [facility];  Service: Orthopedics;  Laterality: Right;  OPEN REDUCTION, INTERNAL FIXATION OF RIGHT ANKLE FRACTURE.   [name] ORIF ANKLE FRACTURE Left 02/10/2016   Procedure: OPEN REDUCTION INTERNAL FIXATION (ORIF) ANKLE FRACTURE;  Surgeon: Newt Minion, MD;  Location: [facility];  Service: Orthopedics;  Laterality: Left;  . SAVORY DILATION N/A 10/09/2014   Procedure: SAVORY DILATION;  Surgeon: Inda Castle, MD;  Location: [facility];  Service: Endoscopy;   Laterality: N/A;  . SAVORY DILATION N/A 09/19/2016   Procedure: SAVORY DILATION;  Surgeon: Doran Stabler, MD;  Location: [facility];  Service: Gastroenterology;  Laterality: N/A;  . TONSILLECTOMY     Social History   Social History  . Marital Status: Divorced    Spouse Name: N/A  . Number of Children: 0   Occupational History  . Retired Pharmacist, hospital Other   Social History Main Topics  . Smoking status: Former Smoker -- 3.00 packs/day for 45 years    Types: Cigarettes    Quit date: 12/05/1997  . Smokeless tobacco: Never Used  . Alcohol Use: No  . Drug Use: No   Social History Narrative   Patient lives at home alone.   Caffeine Use: 2 cups daily   Current Outpatient Prescriptions on File Prior to Visit  Medication Sig Dispense Refill  . B-D UF III MINI PEN NEEDLES 31G X 5 MM MISC U TO CHECK SUGAR FID  3  . Budesonide (UCERIS) 9 MG TB24 Take 9 mg by mouth daily. Pt should take '9mg'$  once daily  90 tablet 1  . clobetasol cream (TEMOVATE) 2.84 % Apply 1 application topically 2 (two) times daily. 45 g 1  . DULoxetine (CYMBALTA) 60 MG capsule Take 1 capsule (60 mg total) by mouth 2 (two) times daily. 60 capsule 12  . fluticasone-salmeterol (ADVAIR HFA) 115-21 MCG/ACT inhaler Inhale 2 puffs into the lungs 2 (two) times daily as needed.     . Insulin Detemir (LEVEMIR FLEXPEN) 100 UNIT/ML Pen Inject 30 units into the skin at bedtime. (Patient taking differently: 20 Units. Inject 30 units into the skin at bedtime.) 10 pen 2  . insulin lispro (HUMALOG KWIKPEN) 100 UNIT/ML KiwkPen INJECT 8-20 units 3x a day under skin (Patient taking differently: Inject 8-20 Units into the skin 4 (four) times daily. Per patient sliding scale.) 30 mL 5  . Insulin Pen Needle 32G X 5 MM MISC Used to check sugar 5 times daily. 100 each 3  . ketoconazole (NIZORAL) 2 % cream Apply 1 application topically daily. Apply cream to rash/psoriasis to sacrum daily. Apply after TEMOVATE cream.    . LORazepam (ATIVAN) 0.5 MG  tablet Take one tablet at onset of panic attack, may repeat in 30 minutes to one hour if needed 30 tablet 3  . ONETOUCH DELICA LANCETS FINE MISC USE TO TEST BLOOD SUGAR FIVE TIMES DAILY AS NEEDED 200 each 11  . ONETOUCH VERIO test strip Use 5x a day 300 each 11  . oxyCODONE-acetaminophen (PERCOCET/ROXICET) 5-325 MG tablet TAKE 1 TABLET BY MOUTH EVERY4-6 HOURS AS NEEDED FOR PAIN.  0  . pregabalin (LYRICA) 150 MG capsule Take 1 capsule (150 mg total) by mouth 2 (two) times daily. 60 capsule 5  . solifenacin (VESICARE) 5 MG tablet Take 5 mg by mouth at bedtime.     . sulfaSALAzine (AZULFIDINE) 500 MG EC tablet Take 2 tablets (1,000 mg total) by mouth 2 (two) times daily. 120 tablet 2   Current Facility-Administered Medications on File Prior to Visit  Medication Dose Route Frequency Provider Last Rate Last Dose  . sodium chloride 0.9 % injection 10 mL  10 mL Intracatheter PRN Curt Bears, MD       Allergies  Allergen Reactions  . Carboplatin Other (See Comments)    Neuropathy  . Remicade [Infliximab] Anaphylaxis  . Hydromorphone Rash   Family History  Problem Relation Age of Onset  . Other Mother   . Other Father     failure to thrive  . Hypertension Other   . Stroke Other   . Heart attack Other   . Hemachromatosis Other   . Rheum arthritis      both sets of grandparents and father   PE: BP 118/80 (BP Location: Left Arm, Patient Position: Sitting)   Pulse (!) 105   Ht '5\' 6"'$  (1.676 m)   Wt 163 lb (73.9 kg)   SpO2 93%   BMI 26.31 kg/m  Wt Readings from Last 3 Encounters:  11/11/16 163 lb (73.9 kg)  11/10/16 159 lb 1.6 oz (72.2 kg)  10/26/16 158 lb 3.2 oz (71.8 kg)   Constitutional: Thin, in NAD Eyes: PERRLA, EOMI, no exophthalmos ENT: moist mucous membranes, no thyromegaly, no cervical lymphadenopathy Cardiovascular: tachycardia, RR, No MRG, + bilateral. Peri-ankle edema (pitting) Respiratory: CTA B Gastrointestinal: abdomen soft, NT, ND, BS+ Musculoskeletal: no  deformities, strength intact in all 4 Skin: moist, warm Neurological: no tremor with outstretched hands, DTR normal in all 4  ASSESSMENT: 1. LADA, insulin-dependent, uncontrolled, with complications - DKA in the setting of dehydration -  PN  Pulmonologist: Dr. Chase Caller Oncologist: Dr. Earlie Server Cardiologist: Dr. Sallyanne Kuster Ophthalmologist: Dr. Clifton James  PLAN:  1. Patient with long-standing, uncontrolled LADA, on basal-bolus insulin regimen, with variable sugars. Sugars are still very fluctuating. At last visit, I gave her an [place] but she could not do this >> will go back to fixed doses of insulin with meals, but increase them slightly. Will also add a dose of Lantus in am. - I suggested to:  Patient Instructions  Please continue: - Levemir 20 units at bedtime and add 10 units in am  - Humalog mealtime dose  - small meal: 8-12 units  - regular meal: 14-18 units  - larger meal: 20-24 units  Please do not skip the insulin if sugars are 70-100.   If the sugars are <70, take 1/2 of the Humalog dose.  - Humalog Sliding scale: 150-200: + 2 unit 201-250: + 4 units 251-300: + 6 units 301-350: + 8 units >350: + 9 units  Please return in 3 months with your sugar log.   Please let me know if the sugars are consistently <80 or >200.  - HbA1c today: 8.7% (higher!) - continue checking sugars at different times of the day - check 3 times a day, rotating checks - advised for yearly eye exams >> she is up-to-date - Return to clinic in 3 mo with sugar log   Philemon Kingdom, MD PhD [facility] Endocrinology

## 2016-11-11 NOTE — Progress Notes (Signed)
  Medical Nutrition Therapy:  Appt start time: 5465 end time:  0354.  Patient was seen late due to not arriving late in the computer.   Assessment:  Primary concerns today: Patient is here today for a follow up.  Hx includes LADA.  She tried carbohydrate counting since last visit and was not able to carbohydrate count or calculate the amount of insulin for her to take.  Her diet is poor quality with increased sandwiches, chips and lack of vegetables at times.  Her blood sugar readings were noted and very high.  Her A1C increased to 8.7% today and her insulin was increased by MD today.  Her weight today is 163 lbs.  She would like to lose 10 lbs.  Discussed with her importance of increasing the quality of her diet and not to restrict to lose weight.    Other hx includes lung cancer since 2014. She is followed by Dr. Julien Nordmann.  She recently told him that the medication was too strong and she no longer wished to take it.  Her weight decreased to 121 lbs in August because I couldn't eat because of a "burned esophagus".  Other hx incudes, HTN, GERD, neuropathy, EGFR 57, and ulcerative colitis history which she has struggled with over the past few days.  Currently a friend is living with her.  They share the shopping but she does most of the cooking.  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  Preferred Learning Style:   No preference indicated   Learning Readiness:   Ready  MEDICATIONS: see list to include Levemir and Humalog with meals and based on sliding scale for CBG's.  Levemir was increased to bid today.   DIETARY INTAKE:  24-hr recall:  B ( AM): instant oatmeal or eggs and bacon with occasional toast and jelly and black coffee  Snk ( AM): fruit  L ( PM): pimento cheese sandwich, pringles, diet coke Snk ( PM): sugar free ice cream  D ( PM): meat, potato, vegetables, starchy vegetable or bread Snk ( PM): fruit Beverages: water, diet soda, black coffee  Usual physical activity:  ADL's.  Patient is quite short of breath.  Estimated energy needs: 1600 calories 180 g carbohydrates 120 g protein 44 g fat  Progress Towards Goal(s):  In progress.   Nutritional Diagnosis:  NB-1.1 Food and nutrition-related knowledge deficit As related to balance of carbohydrate, protein, and fat.  As evidenced by diet hx and patient report.    Intervention:  Nutrition counseling/education review.  Reviewed sources of carbohydrate, portions and basics of a balanced diet.    Eat Breakfast, Lunch, and Dinner every day.  Include carbohydrate and protein each time.  Aim for 45-60 grams of carbohydrates with each meal. Have a snack with no more than 15 grams of carbohydrates and a protein when you are hungry. Work on improving the nutrition quality of your diet. If you are unable to eat a meal then drink a Glucerna or Boost Glucose Control.  Teaching Method Utilized:  Visual Auditory Hands on  Handouts given during visit include:  My plate  Barriers to learning/adherence to lifestyle change: increased shortness of breath, ?memory issues/ability to focus.  Demonstrated degree of understanding via:  Teach Back   Monitoring/Evaluation:  Dietary intake, exercise, label reading, and body weight in 3 month(s).

## 2016-11-11 NOTE — Patient Instructions (Addendum)
Please continue: - Levemir 20 units at bedtime and add 10 units in am  - Humalog mealtime dose  - small meal: 8-12 units  - regular meal: 14-18 units  - larger meal: 20-24 units  Please do not skip the insulin if sugars are 70-100.   If the sugars are <70, take 1/2 of the Humalog dose.  - Humalog Sliding scale: 150-200: + 2 unit 201-250: + 4 units 251-300: + 6 units 301-350: + 8 units >350: + 9 units  Please return in 3 months with your sugar log.   Please let me know if the sugars are consistently <80 or >200.

## 2016-11-14 ENCOUNTER — Other Ambulatory Visit (INDEPENDENT_AMBULATORY_CARE_PROVIDER_SITE_OTHER): Payer: Medicare Other

## 2016-11-14 DIAGNOSIS — E109 Type 1 diabetes mellitus without complications: Secondary | ICD-10-CM | POA: Diagnosis not present

## 2016-11-14 DIAGNOSIS — E139 Other specified diabetes mellitus without complications: Secondary | ICD-10-CM

## 2016-11-14 LAB — LIPID PANEL
Cholesterol: 201 mg/dL — ABNORMAL HIGH (ref 0–200)
HDL: 57.4 mg/dL (ref >39.00)
LDL Cholesterol: 128 mg/dL — ABNORMAL HIGH (ref 0–99)
NONHDL: 143.83
TRIGLYCERIDES: 79 mg/dL (ref 0.0–149.0)
Total CHOL/HDL Ratio: 4
VLDL: 15.8 mg/dL (ref 0.0–40.0)

## 2016-11-16 ENCOUNTER — Other Ambulatory Visit: Payer: Self-pay | Admitting: Nurse Practitioner

## 2016-11-21 ENCOUNTER — Telehealth: Payer: Self-pay | Admitting: Gastroenterology

## 2016-11-22 NOTE — Telephone Encounter (Signed)
It would be very unusual for that medicine, but I suppose it is possible. Please stop it for 5 days, then resume it once every other day and let's see what happens.

## 2016-11-22 NOTE — Telephone Encounter (Signed)
Patient states she has been experiencing constipation for over 2 weeks, then last week had one bout of diarrhea, she now is continuing with constipation. She thinks her symptoms are from the Uceris. She has follow up appointment with you on 2/12. Please advise if you'd like her to do anything differently.

## 2016-11-22 NOTE — Telephone Encounter (Signed)
Patient advised of Dr. Loletha Carrow' recommendations. She will stop Uceris for 5 days then resume taking it every other day. Follow up at upcoming appointment.

## 2016-12-01 ENCOUNTER — Telehealth: Payer: Self-pay | Admitting: Gastroenterology

## 2016-12-01 NOTE — Telephone Encounter (Signed)
Patient has stopped taking the Uceris, has experience more constipation. Please advise.

## 2016-12-02 NOTE — Telephone Encounter (Signed)
One half to one packet/capful  of miralax once a day, stay off the Uceris and I will talk to her about it more when she sees me on the 12th

## 2016-12-02 NOTE — Telephone Encounter (Signed)
Patient advised to try miralax, stop Uceris and follow up on 2/12 in office.

## 2016-12-12 ENCOUNTER — Ambulatory Visit (INDEPENDENT_AMBULATORY_CARE_PROVIDER_SITE_OTHER): Payer: Medicare Other | Admitting: Gastroenterology

## 2016-12-12 ENCOUNTER — Encounter: Payer: Self-pay | Admitting: Gastroenterology

## 2016-12-12 VITALS — BP 114/74 | HR 120 | Ht 65.0 in | Wt 169.1 lb

## 2016-12-12 DIAGNOSIS — K222 Esophageal obstruction: Secondary | ICD-10-CM | POA: Diagnosis not present

## 2016-12-12 DIAGNOSIS — K5901 Slow transit constipation: Secondary | ICD-10-CM

## 2016-12-12 NOTE — Patient Instructions (Addendum)
Constipation:  Senokot - S , one tablet daily  Miralax , one half to one whole  capful in water or juice once daily.  If you are age 75 or older, your body mass index should be between 23-30. Your Body mass index is 28.14 kg/m. If this is out of the aforementioned range listed, please consider follow up with your Primary Care Provider.  If you are age 25 or younger, your body mass index should be between 19-25. Your Body mass index is 28.14 kg/m. If this is out of the aformentioned range listed, please consider follow up with your Primary Care Provider.   Thank you for choosing [facility]  Dr Wilfrid Lund III

## 2016-12-12 NOTE — Progress Notes (Signed)
[name] Progress Note  Chief Complaint: Constipation  Subjective  History:  Megan sees me for constipation. I had seen her a few times over this last 6-9 months for colitis. She seemed to have chronic ulcerative colitis, but did not always take her meds as ordered. She also continues to have memory trouble, which makes her history somewhat difficult to obtain. She vaguely recalls seeing me 2 months ago, but did not recall that she had been in the ED just prior to that. Because she had had a flare of abdominal pain and rectal bleeding, I put her on Uceris thinking that perhaps her colitis had flared. He called [place] back about 2 weeks later saying that she was feeling well, so the plan was continue it for about 2 months. She then called 2 or 3 weeks ago saying she was feeling more constipated and thought the medicine might be related. She ultimately stopped it, has been taking some as needed MiraLAX but still having difficulty moving her bowels. She'll go every 3-5 days and sometimes sit on the toilet for a long while and have to strain. Lastly, she still has intermittent dysphagia improved after an EGD with dilation. She has a radiation-induced stricture that is challenging to dilate. ROS: Cardiovascular:  no chest pain Respiratory: no dyspnea  The patient's Past Medical, Family and Social History were reviewed and are on file in the EMR. While she does have narcotic pain meds listed on her chart, she has not been taking them regularly. Objective:  Med list reviewed  Vital signs in last 24 hrs: Vitals:   12/12/16 1500  BP: 114/74  Pulse: (!) 120    Physical Exam    HEENT: sclera anicteric, oral mucosa moist without lesions  Neck: supple, no thyromegaly, JVD or lymphadenopathy  Cardiac: RRR without murmurs, S1S2 heard, no peripheral edema  Pulm: clear to auscultation bilaterally, normal RR and effort noted  Abdomen: soft, No tenderness, with active bowel sounds. No guarding  or palpable hepatosplenomegaly.  Skin; warm and dry, no jaundice or rash   '@ASSESSMENTPLANBEGIN'$ @ Assessment: Encounter Diagnoses  Name Primary?  . Slow transit constipation Yes  . Radiation-induced esophageal stricture    She does not seem to be having colitis symptoms at this point. She will stay off the Uceris and I have outlined a bowel regimen for her to take. I gave it to her in written form because of her memory difficulty.  I encouraged her to call me in about a month with an update and I will see her as needed.   Total time 20 minutes, over half spent in counseling and coordination of care.   Nelida Meuse III

## 2016-12-15 ENCOUNTER — Other Ambulatory Visit: Payer: Self-pay | Admitting: Rheumatology

## 2016-12-15 ENCOUNTER — Other Ambulatory Visit: Payer: Self-pay | Admitting: Internal Medicine

## 2016-12-15 NOTE — Telephone Encounter (Signed)
Last Visit: 09/09/16 Next Visit: 03/08/17 Labs with Oncologist: 11/03/16 Alk. Phos [phone]  Okay to refill SSZ?

## 2017-01-05 ENCOUNTER — Encounter (INDEPENDENT_AMBULATORY_CARE_PROVIDER_SITE_OTHER): Payer: Self-pay | Admitting: Orthopedic Surgery

## 2017-01-05 ENCOUNTER — Ambulatory Visit (INDEPENDENT_AMBULATORY_CARE_PROVIDER_SITE_OTHER): Payer: Medicare Other

## 2017-01-05 ENCOUNTER — Ambulatory Visit (INDEPENDENT_AMBULATORY_CARE_PROVIDER_SITE_OTHER): Payer: Medicare Other | Admitting: Orthopedic Surgery

## 2017-01-05 DIAGNOSIS — R2241 Localized swelling, mass and lump, right lower limb: Secondary | ICD-10-CM

## 2017-01-05 DIAGNOSIS — T8484XA Pain due to internal orthopedic prosthetic devices, implants and grafts, initial encounter: Secondary | ICD-10-CM | POA: Diagnosis not present

## 2017-01-05 NOTE — Progress Notes (Signed)
Office Visit Note   Patient: Megan [name]           Date of Birth: Oct 15, 1941           MRN: [id number] Visit Date: 01/05/2017              Requested by: Binnie Rail, MD [place], [place] [id number] PCP: Binnie Rail, MD  Chief Complaint  Patient presents with  . Right Foot - Pain    HPI: Patient is a 76 y.o female who presents today with right foot pain. She was pending soft tissue mass excision, but this was cancelled because she thought it had resolved.  She feels she has bone growth to medial aspect of midfoot. She complains of swelling. She has pain with walking. She has history of fusion at the base of the first metatarsal Lisfranc joint, right foot, with a [employer] osteotomy of the great toe 07/26/16. Maxcine Ham, RT   Patient states she has difficulty putting on her compression socks she has not been using them. She states she does have a sock donner Assessment & Plan: Visit Diagnoses:  1. Mass of right foot   2. Painful orthopaedic hardware [facility])     Plan: We'll plan for removal of the Lisfranc screw right foot. Recommended new balance sneakers recommended orthotics recommended that she wear her medical compression stockings.  Follow-Up Instructions: Return in about 2 weeks (around 01/19/2017).   Ortho Exam On examination patient is alert oriented no adenopathy well-dressed normal affect normal respiratory effort she has a slow antalgic gait. Examination she's a good dorsalis pedis pulse she has pain to palpation over a prominent screw head across the Lisfranc joint. There is no redness no cellulitis no signs of infection. Patient has venous stasis swelling with pitting edema up to the tibial tubercle. ROS: Complete review of systems negative except as mentioned in the history of present illness. Imaging: Xr Foot Complete Right  Result Date: 01/05/2017 Three-view radiographs the right foot shows stable internal fixation across the Lisfranc fracture  dislocation. Of note patient has had significant backing out of the Lisfranc screw. There is no rocker bottom deformity.   Labs: Lab Results  Component Value Date   HGBA1C 8.7 11/11/2016   HGBA1C 8.3 08/12/2016   HGBA1C 10.5 05/04/2016   ESRSEDRATE 50 (H) 07/22/2014   REPTSTATUS 02/28/2016 FINAL 02/26/2016   CULT 7,000 COLONIES/mL INSIGNIFICANT GROWTH (A) 02/26/2016    Orders:  Orders Placed This Encounter  Procedures  . XR Foot Complete Right   No orders of the defined types were placed in this encounter.    Procedures: No procedures performed  Clinical Data: No additional findings.  Subjective: Review of Systems  Objective: Vital Signs: There were no vitals taken for this visit.  Specialty Comments:  No specialty comments available.  PMFS History: Patient Active Problem List   Diagnosis Date Noted  . Painful orthopaedic hardware ([place]) 01/05/2017  . Psoriatic arthropathy ([place]) 09/08/2016  . Psoriatic arthritis ([place]) 09/08/2016  . Hypertension 09/08/2016  . Diabetic neuropathy ([place]) 09/08/2016  . 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 Syncope 03/25/2016  . Malnutrition of moderate degree 02/29/2016  . Skin breakdown 02/26/2016  . Leukocytosis 02/17/2016  . Diabetic ketoacidosis with coma associated with diabetes mellitus due to underlying condition ([place])   . Abnormal finding on GI tract imaging   . Hematochezia   . Acute kidney injury ([place]) 02/04/2016  .  RLS (restless legs syndrome) 12/11/2015  . Diarrhea due to drug 08/11/2015  . Liver dysfunction 08/11/2015  . Lumbago 05/06/2015  . Hot flashes 04/14/2015  . Peripheral edema 04/14/2015  . Rheumatoid arthritis ([place]) 04/14/2015  . Encounter for antineoplastic immunotherapy 04/06/2015  . Emphysema of lung ([place]) 11/26/2014  . Radiation-induced esophageal stricture 10/01/2014  . Tachycardia 08/27/2014  . Depression 08/27/2014  . Palliative care encounter  08/21/2014  . DNR (do not resuscitate) discussion 08/21/2014  . Neuropathic pain 08/21/2014  . Cancer associated pain 07/29/2014  . Malignant cachexia ([place]) 07/29/2014  . Lung collapse 07/29/2014  . Fatigue 07/18/2014  . Neuropathy due to chemotherapeutic drug ([place]) 07/18/2014  . Leg cramps 07/18/2014  . Hypoalbuminemia 07/18/2014  . Thrombocytopenia, unspecified 07/18/2014  . Dyspnea 04/28/2014  . Radiation esophagitis 04/10/2014  . Right-sided chest wall pain 04/01/2014  . Cancer of middle lobe of lung ([place]) 01/17/2014  . Malignant neoplasm of right upper lobe of lung ([place]) 11/28/2013  . Lung mass 11/09/2013   Past Medical History:  Diagnosis Date  . Cancer ([place]) dx'd 09/2013   Lung ca  . Diabetes ([place]) 09/08/2016   type I diabetic patient reported on 09/13/16   . Diabetes mellitus    insulin  . Diabetic neuropathy ([place]) 09/08/2016  . GERD (gastroesophageal reflux disease)    no meds for  . History of hiatal hernia   . Hx of radiation therapy 12/16/13-01/30/14   lung 66Gy  . Hypertension   . Malignant neoplasm of right upper lobe of lung ([place]) 11/28/2013  . Neuropathy ([place])   . Psoriasis   . Psoriatic arthritis ([place]) 09/08/2016  . Rheumatoid arthritis ([place])   . Shortness of breath dyspnea    with exertion    Family History  Problem Relation Age of Onset  . Other Mother   . Other Father     failure to thrive  . Hypertension Other   . Stroke Other   . Heart attack Other   . Hemachromatosis Other   . Rheum arthritis      both sets of grandparents and father    Past Surgical History:  Procedure Laterality Date  . ABDOMINAL HYSTERECTOMY    . ABDOMINAL SURGERY    . BALLOON DILATION N/A 12/12/2014   Procedure: BALLOON DILATION;  Surgeon: Inda Castle, MD;  Location: [facility];  Service: Endoscopy;  Laterality: N/A;  . CHOLECYSTECTOMY    . COLONOSCOPY N/A 02/07/2016   Procedure: COLONOSCOPY;  Surgeon: Doran Stabler, MD;  Location: [facility] ENDOSCOPY;  Service:  Endoscopy;  Laterality: N/A;  . DILATION AND CURETTAGE OF UTERUS    . ESOPHAGOGASTRODUODENOSCOPY N/A 12/12/2014   Procedure: ESOPHAGOGASTRODUODENOSCOPY (EGD);  Surgeon: Inda Castle, MD;  Location: [facility];  Service: Endoscopy;  Laterality: N/A;  with dilation  . ESOPHAGOGASTRODUODENOSCOPY (EGD) WITH PROPOFOL N/A 10/09/2014   Procedure: ESOPHAGOGASTRODUODENOSCOPY (EGD) WITH PROPOFOL;  Surgeon: Inda Castle, MD;  Location: [facility];  Service: Endoscopy;  Laterality: N/A;  . ESOPHAGOGASTRODUODENOSCOPY (EGD) WITH PROPOFOL N/A 09/19/2016   Procedure: ESOPHAGOGASTRODUODENOSCOPY (EGD) WITH PROPOFOL;  Surgeon: Doran Stabler, MD;  Location: [facility] ENDOSCOPY;  Service: Gastroenterology;  Laterality: N/A;  with savary dil tt  . FRACTURE SURGERY    . ORIF ANKLE FRACTURE Right 07/04/2015   Procedure: OPEN REDUCTION INTERNAL FIXATION (ORIF) ANKLE FRACTURE;  Surgeon: Newt Minion, MD;  Location: [facility];  Service: Orthopedics;  Laterality: Right;  OPEN REDUCTION, INTERNAL FIXATION OF RIGHT ANKLE FRACTURE.   [name] ORIF ANKLE  FRACTURE Left 02/10/2016   Procedure: OPEN REDUCTION INTERNAL FIXATION (ORIF) ANKLE FRACTURE;  Surgeon: Newt Minion, MD;  Location: [facility];  Service: Orthopedics;  Laterality: Left;  . SAVORY DILATION N/A 10/09/2014   Procedure: SAVORY DILATION;  Surgeon: Inda Castle, MD;  Location: [place];  Service: Endoscopy;  Laterality: N/A;  . SAVORY DILATION N/A 09/19/2016   Procedure: SAVORY DILATION;  Surgeon: Doran Stabler, MD;  Location: [facility];  Service: Gastroenterology;  Laterality: N/A;  . TONSILLECTOMY     Social History   Occupational History  . Retired Other   Social History Main Topics  . Smoking status: Former Smoker    Packs/day: 3.00    Years: 45.00    Types: Cigarettes    Quit date: 12/05/1997  . Smokeless tobacco: Never Used  . Alcohol use No  . Drug use: No  . Sexual activity: Not Currently

## 2017-01-11 ENCOUNTER — Emergency Department (HOSPITAL_COMMUNITY): Payer: Medicare Other

## 2017-01-11 ENCOUNTER — Observation Stay (HOSPITAL_COMMUNITY)
Admission: EM | Admit: 2017-01-11 | Discharge: 2017-01-12 | Disposition: A | Discharge status: Home / Self Care | Payer: Medicare Other | Attending: Internal Medicine | Admitting: Internal Medicine

## 2017-01-11 ENCOUNTER — Encounter (HOSPITAL_COMMUNITY): Payer: Self-pay | Admitting: *Deleted

## 2017-01-11 DIAGNOSIS — Z85118 Personal history of other malignant neoplasm of bronchus and lung: Secondary | ICD-10-CM | POA: Insufficient documentation

## 2017-01-11 DIAGNOSIS — R52 Pain, unspecified: Secondary | ICD-10-CM

## 2017-01-11 DIAGNOSIS — F329 Major depressive disorder, single episode, unspecified: Secondary | ICD-10-CM | POA: Diagnosis not present

## 2017-01-11 DIAGNOSIS — Z794 Long term (current) use of insulin: Secondary | ICD-10-CM | POA: Diagnosis not present

## 2017-01-11 DIAGNOSIS — C342 Malignant neoplasm of middle lobe, bronchus or lung: Secondary | ICD-10-CM | POA: Diagnosis not present

## 2017-01-11 DIAGNOSIS — E1140 Type 2 diabetes mellitus with diabetic neuropathy, unspecified: Secondary | ICD-10-CM | POA: Diagnosis not present

## 2017-01-11 DIAGNOSIS — S60211A Contusion of right wrist, initial encounter: Secondary | ICD-10-CM | POA: Insufficient documentation

## 2017-01-11 DIAGNOSIS — I609 Nontraumatic subarachnoid hemorrhage, unspecified: Secondary | ICD-10-CM

## 2017-01-11 DIAGNOSIS — S00211A Abrasion of right eyelid and periocular area, initial encounter: Secondary | ICD-10-CM | POA: Diagnosis not present

## 2017-01-11 DIAGNOSIS — Z23 Encounter for immunization: Secondary | ICD-10-CM | POA: Insufficient documentation

## 2017-01-11 DIAGNOSIS — W108XXA Fall (on) (from) other stairs and steps, initial encounter: Secondary | ICD-10-CM | POA: Diagnosis not present

## 2017-01-11 DIAGNOSIS — E1149 Type 2 diabetes mellitus with other diabetic neurological complication: Secondary | ICD-10-CM | POA: Diagnosis not present

## 2017-01-11 DIAGNOSIS — Y9301 Activity, walking, marching and hiking: Secondary | ICD-10-CM | POA: Insufficient documentation

## 2017-01-11 DIAGNOSIS — I6200 Nontraumatic subdural hemorrhage, unspecified: Secondary | ICD-10-CM | POA: Diagnosis present

## 2017-01-11 DIAGNOSIS — S066X0A Traumatic subarachnoid hemorrhage without loss of consciousness, initial encounter: Secondary | ICD-10-CM | POA: Diagnosis not present

## 2017-01-11 DIAGNOSIS — E1040 Type 1 diabetes mellitus with diabetic neuropathy, unspecified: Secondary | ICD-10-CM | POA: Diagnosis not present

## 2017-01-11 DIAGNOSIS — Y929 Unspecified place or not applicable: Secondary | ICD-10-CM | POA: Insufficient documentation

## 2017-01-11 DIAGNOSIS — I10 Essential (primary) hypertension: Secondary | ICD-10-CM | POA: Diagnosis not present

## 2017-01-11 DIAGNOSIS — Z87891 Personal history of nicotine dependence: Secondary | ICD-10-CM | POA: Diagnosis not present

## 2017-01-11 DIAGNOSIS — Y999 Unspecified external cause status: Secondary | ICD-10-CM | POA: Insufficient documentation

## 2017-01-11 DIAGNOSIS — F32A Depression, unspecified: Secondary | ICD-10-CM | POA: Diagnosis present

## 2017-01-11 DIAGNOSIS — S0993XA Unspecified injury of face, initial encounter: Secondary | ICD-10-CM | POA: Diagnosis present

## 2017-01-11 DIAGNOSIS — W19XXXA Unspecified fall, initial encounter: Secondary | ICD-10-CM

## 2017-01-11 LAB — CBC WITH DIFFERENTIAL/PLATELET
BASOS ABS: 0.1 10*3/uL (ref 0.0–0.1)
Basophils Relative: 1 %
EOS PCT: 5 %
Eosinophils Absolute: 0.5 10*3/uL (ref 0.0–0.7)
HCT: 41 % (ref 36.0–46.0)
Hemoglobin: 13.1 g/dL (ref 12.0–15.0)
LYMPHS ABS: 1.6 10*3/uL (ref 0.7–4.0)
LYMPHS PCT: 18 %
MCH: 28.9 pg (ref 26.0–34.0)
MCHC: 32 g/dL (ref 30.0–36.0)
MCV: 90.5 fL (ref 78.0–100.0)
MONO ABS: 0.6 10*3/uL (ref 0.1–1.0)
Monocytes Relative: 7 %
Neutro Abs: 6.3 10*3/uL (ref 1.7–7.7)
Neutrophils Relative %: 69 %
PLATELETS: 183 10*3/uL (ref 150–400)
RBC: 4.53 MIL/uL (ref 3.87–5.11)
RDW: 17 % — AB (ref 11.5–15.5)
WBC: 9.1 10*3/uL (ref 4.0–10.5)

## 2017-01-11 LAB — GLUCOSE, CAPILLARY: GLUCOSE-CAPILLARY: 422 mg/dL — AB (ref 65–99)

## 2017-01-11 LAB — BASIC METABOLIC PANEL
Anion gap: 7 (ref 5–15)
BUN: 20 mg/dL (ref 6–20)
CO2: 27 mmol/L (ref 22–32)
Calcium: 9.1 mg/dL (ref 8.9–10.3)
Chloride: 104 mmol/L (ref 101–111)
Creatinine, Ser: 0.73 mg/dL (ref 0.44–1.00)
GFR calc Af Amer: >60 mL/min (ref >60)
GLUCOSE: 241 mg/dL — AB (ref 65–99)
POTASSIUM: 3.7 mmol/L (ref 3.5–5.1)
Sodium: 138 mmol/L (ref 135–145)

## 2017-01-11 LAB — PROTIME-INR
INR: 0.88
Prothrombin Time: 12 seconds (ref 11.4–15.2)

## 2017-01-11 LAB — GLUCOSE, RANDOM: Glucose, Bld: 449 mg/dL — ABNORMAL HIGH (ref 65–99)

## 2017-01-11 LAB — APTT: aPTT: 24 seconds (ref 24–36)

## 2017-01-11 MED ORDER — INSULIN ASPART 100 UNIT/ML ~~LOC~~ SOLN
8.0000 [IU] | Freq: Once | SUBCUTANEOUS | Status: AC
  Administered 2017-01-11: 8 [IU] via SUBCUTANEOUS

## 2017-01-11 MED ORDER — DULOXETINE HCL 60 MG PO CPEP
60.0000 mg | ORAL_CAPSULE | Freq: Two times a day (BID) | ORAL | Status: DC
  Administered 2017-01-11 – 2017-01-12 (×2): 60 mg via ORAL
  Filled 2017-01-11 (×2): qty 1

## 2017-01-11 MED ORDER — PREGABALIN 75 MG PO CAPS
150.0000 mg | ORAL_CAPSULE | Freq: Two times a day (BID) | ORAL | Status: DC
  Administered 2017-01-11 – 2017-01-12 (×2): 150 mg via ORAL
  Filled 2017-01-11 (×2): qty 2

## 2017-01-11 MED ORDER — MOMETASONE FURO-FORMOTEROL FUM 200-5 MCG/ACT IN AERO
2.0000 | INHALATION_SPRAY | Freq: Two times a day (BID) | RESPIRATORY_TRACT | Status: DC
  Administered 2017-01-12: 2 via RESPIRATORY_TRACT
  Filled 2017-01-11: qty 8.8

## 2017-01-11 MED ORDER — ALBUTEROL SULFATE (2.5 MG/3ML) 0.083% IN NEBU
2.5000 mg | INHALATION_SOLUTION | RESPIRATORY_TRACT | Status: DC | PRN
Start: 2017-01-11 — End: 2017-01-12

## 2017-01-11 MED ORDER — SULFASALAZINE 500 MG PO TBEC
1000.0000 mg | DELAYED_RELEASE_TABLET | Freq: Two times a day (BID) | ORAL | Status: DC
  Administered 2017-01-11 – 2017-01-12 (×2): 1000 mg via ORAL
  Filled 2017-01-11 (×2): qty 2

## 2017-01-11 MED ORDER — DARIFENACIN HYDROBROMIDE ER 7.5 MG PO TB24
7.5000 mg | ORAL_TABLET | Freq: Every day | ORAL | Status: DC
  Administered 2017-01-11: 7.5 mg via ORAL
  Filled 2017-01-11: qty 1

## 2017-01-11 MED ORDER — INSULIN ASPART 100 UNIT/ML ~~LOC~~ SOLN
0.0000 [IU] | Freq: Three times a day (TID) | SUBCUTANEOUS | Status: DC
  Administered 2017-01-12: 3 [IU] via SUBCUTANEOUS
  Administered 2017-01-12: 5 [IU] via SUBCUTANEOUS

## 2017-01-11 MED ORDER — LIDOCAINE-EPINEPHRINE-TETRACAINE (LET) SOLUTION
3.0000 mL | Freq: Once | NASAL | Status: AC
  Administered 2017-01-11: 16:00:00 3 mL via TOPICAL
  Filled 2017-01-11: qty 3

## 2017-01-11 MED ORDER — ACETAMINOPHEN 650 MG RE SUPP: 650.0000 mg | Freq: Four times a day (QID) | RECTAL | Status: DC | PRN

## 2017-01-11 MED ORDER — INSULIN DETEMIR 100 UNIT/ML ~~LOC~~ SOLN
20.0000 [IU] | Freq: Every day | SUBCUTANEOUS | Status: DC
  Administered 2017-01-11: 20 [IU] via SUBCUTANEOUS
  Filled 2017-01-11 (×2): qty 0.2

## 2017-01-11 MED ORDER — ACETAMINOPHEN 325 MG PO TABS: 650.0000 mg | ORAL_TABLET | Freq: Four times a day (QID) | ORAL | Status: DC | PRN

## 2017-01-11 MED ORDER — TETANUS-DIPHTH-ACELL PERTUSSIS 5-2.5-18.5 LF-MCG/0.5 IM SUSP
0.5000 mL | Freq: Once | INTRAMUSCULAR | Status: AC
  Administered 2017-01-11: 0.5 mL via INTRAMUSCULAR
  Filled 2017-01-11: qty 0.5

## 2017-01-11 MED ORDER — OXYCODONE-ACETAMINOPHEN 5-325 MG PO TABS
1.0000 | ORAL_TABLET | Freq: Four times a day (QID) | ORAL | Status: DC | PRN
  Administered 2017-01-11 – 2017-01-12 (×2): 1 via ORAL
  Filled 2017-01-11 (×2): qty 1

## 2017-01-11 MED ORDER — BACITRACIN ZINC 500 UNIT/GM EX OINT
TOPICAL_OINTMENT | CUTANEOUS | Status: AC
  Administered 2017-01-11: 1 via TOPICAL
  Filled 2017-01-11: qty 0.9

## 2017-01-11 MED ORDER — INSULIN ASPART 100 UNIT/ML ~~LOC~~ SOLN: 0.0000 [IU] | Freq: Every day | SUBCUTANEOUS | Status: DC

## 2017-01-11 NOTE — ED Provider Notes (Signed)
[facility] DEPT Provider Note   CSN: [id number] Arrival date & time: 01/11/17  1317  By signing my name below, I, Jeanell Sparrow, attest that this documentation has been prepared under the direction and in the presence of non-physician practitioner, Clayton Bibles, PA-C. Electronically Signed: Jeanell Sparrow, Scribe. 01/11/2017. 1:45 PM.  History   Chief Complaint Chief Complaint  Patient presents with  . Fall  . Facial Injury   The history is provided by the patient. No language interpreter was used.   HPI Comments: Megan [name] is a 76 y.o. female who presents to the Emergency Department complaining of a fall occurred today. She states she was walking down steps when someone called her name, she turned around, and lost her footing, fell onto gravel cement. She denies any head injury or LOC. She reports associated right periorbital wounds and constant moderate right-sided facial pain. She did get her right hand out to slow her fall and has some soreness over the ulnar aspect of her hand.  She is unsure of tetanus status. She denies any dental problem, visual disturbance, difficulty with eye movement, chest pain, abdominal pain, neck pain, BLE/hip pain, or other complaints.     PCP: Binnie Rail, MD  Past Medical History:  Diagnosis Date  . Cancer ([place]) dx'd 09/2013   Lung ca  . Diabetes ([place]) 09/08/2016   type I diabetic patient reported on 09/13/16   . Diabetes mellitus    insulin  . Diabetic neuropathy ([place]) 09/08/2016  . GERD (gastroesophageal reflux disease)    no meds for  . History of hiatal hernia   . Hx of radiation therapy 12/16/13-01/30/14   lung 66Gy  . Hypertension   . Malignant neoplasm of right upper lobe of lung ([place]) 11/28/2013  . Neuropathy ([place])   . Psoriasis   . Psoriatic arthritis ([place]) 09/08/2016  . Rheumatoid arthritis ([place])   . Shortness of breath dyspnea    with exertion    Patient Active Problem List   Diagnosis Date Noted  . Subdural  hemorrhage ([place]) 01/11/2017  . Diabetes mellitus ([place]) 01/11/2017  . Painful orthopaedic hardware ([place]) 01/05/2017  . Psoriatic arthropathy ([place]) 09/08/2016  . Psoriatic arthritis ([place]) 09/08/2016  . Hypertension 09/08/2016  . Diabetic neuropathy ([place]) 09/08/2016  . LADA (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), managed as type 1 ([place]) 05/04/2016  . Syncope 03/25/2016  . Malnutrition of moderate degree 02/29/2016  . Skin breakdown 02/26/2016  . Leukocytosis 02/17/2016  . Diabetic ketoacidosis with coma associated with diabetes mellitus due to underlying condition ([place])   . Abnormal finding on GI tract imaging   . Hematochezia   . Acute kidney injury ([place]) 02/04/2016  . RLS (restless legs syndrome) 12/11/2015  . Diarrhea due to drug 08/11/2015  . Liver dysfunction 08/11/2015  . Lumbago 05/06/2015  . Hot flashes 04/14/2015  . Peripheral edema 04/14/2015  . Rheumatoid arthritis ([place]) 04/14/2015  . Encounter for antineoplastic immunotherapy 04/06/2015  . Emphysema of lung ([place]) 11/26/2014  . Radiation-induced esophageal stricture 10/01/2014  . Tachycardia 08/27/2014  . Depression 08/27/2014  . Palliative care encounter 08/21/2014  . DNR (do not resuscitate) discussion 08/21/2014  . Neuropathic pain 08/21/2014  . Cancer associated pain 07/29/2014  . Malignant cachexia ([place]) 07/29/2014  . Lung collapse 07/29/2014  . Fatigue 07/18/2014  . Neuropathy due to chemotherapeutic drug ([place]) 07/18/2014  . Leg cramps 07/18/2014  . Hypoalbuminemia 07/18/2014  . Thrombocytopenia, unspecified 07/18/2014  . Dyspnea 04/28/2014  . Radiation esophagitis 04/10/2014  .  Right-sided chest wall pain 04/01/2014  . Cancer of middle lobe of lung ([place]) 01/17/2014  . Malignant neoplasm of right upper lobe of lung ([place]) 11/28/2013  . Lung mass 11/09/2013    Past Surgical History:  Procedure Laterality Date  . ABDOMINAL HYSTERECTOMY    . ABDOMINAL SURGERY    . BALLOON DILATION N/A 12/12/2014    Procedure: BALLOON DILATION;  Surgeon: Inda Castle, MD;  Location: [facility];  Service: Endoscopy;  Laterality: N/A;  . CHOLECYSTECTOMY    . COLONOSCOPY N/A 02/07/2016   Procedure: COLONOSCOPY;  Surgeon: Doran Stabler, MD;  Location: [facility] ENDOSCOPY;  Service: Endoscopy;  Laterality: N/A;  . DILATION AND CURETTAGE OF UTERUS    . ESOPHAGOGASTRODUODENOSCOPY N/A 12/12/2014   Procedure: ESOPHAGOGASTRODUODENOSCOPY (EGD);  Surgeon: Inda Castle, MD;  Location: [facility];  Service: Endoscopy;  Laterality: N/A;  with dilation  . ESOPHAGOGASTRODUODENOSCOPY (EGD) WITH PROPOFOL N/A 10/09/2014   Procedure: ESOPHAGOGASTRODUODENOSCOPY (EGD) WITH PROPOFOL;  Surgeon: Inda Castle, MD;  Location: [facility];  Service: Endoscopy;  Laterality: N/A;  . ESOPHAGOGASTRODUODENOSCOPY (EGD) WITH PROPOFOL N/A 09/19/2016   Procedure: ESOPHAGOGASTRODUODENOSCOPY (EGD) WITH PROPOFOL;  Surgeon: Doran Stabler, MD;  Location: [facility] ENDOSCOPY;  Service: Gastroenterology;  Laterality: N/A;  with savary dil tt  . FRACTURE SURGERY    . ORIF ANKLE FRACTURE Right 07/04/2015   Procedure: OPEN REDUCTION INTERNAL FIXATION (ORIF) ANKLE FRACTURE;  Surgeon: Newt Minion, MD;  Location: [facility];  Service: Orthopedics;  Laterality: Right;  OPEN REDUCTION, INTERNAL FIXATION OF RIGHT ANKLE FRACTURE.   [name] ORIF ANKLE FRACTURE Left 02/10/2016   Procedure: OPEN REDUCTION INTERNAL FIXATION (ORIF) ANKLE FRACTURE;  Surgeon: Newt Minion, MD;  Location: [facility];  Service: Orthopedics;  Laterality: Left;  . SAVORY DILATION N/A 10/09/2014   Procedure: SAVORY DILATION;  Surgeon: Inda Castle, MD;  Location: [facility];  Service: Endoscopy;  Laterality: N/A;  . SAVORY DILATION N/A 09/19/2016   Procedure: SAVORY DILATION;  Surgeon: Doran Stabler, MD;  Location: [facility] ENDOSCOPY;  Service: Gastroenterology;  Laterality: N/A;  . TONSILLECTOMY      OB History    No data available       Home Medications    Prior to Admission  medications   Medication Sig Start Date End Date Taking? Authorizing Provider  B-D UF III MINI PEN NEEDLES 31G X 5 MM MISC U TO CHECK SUGAR FID 07/25/16   Historical Provider, MD  B-D UF III MINI PEN NEEDLES 31G X 5 MM MISC USE TO CHECK SUGAR FIVE TIMES DAILY 12/15/16   Philemon Kingdom, MD  DULoxetine (CYMBALTA) 60 MG capsule Take 1 capsule (60 mg total) by mouth 2 (two) times daily. 10/26/16   Melvenia Beam, MD  fluticasone-salmeterol (ADVAIR HFA) [phone] MCG/ACT inhaler Inhale 2 puffs into the lungs 2 (two) times daily as needed.     Historical Provider, MD  Insulin Detemir (LEVEMIR FLEXPEN) 100 UNIT/ML Pen Inject 30 units into the skin at bedtime. Patient taking differently: 20 Units. Inject 30 units into the skin at bedtime. 10/01/16   Doristine Devoid, PA-C  insulin lispro (HUMALOG KWIKPEN) 100 UNIT/ML KiwkPen INJECT 8-20 units 3x a day under skin Patient taking differently: Inject 8-20 Units into the skin 4 (four) times daily. Per patient sliding scale. 06/16/16   Philemon Kingdom, MD  LORazepam (ATIVAN) 0.5 MG tablet Take one tablet at onset of panic attack, may repeat in 30 minutes to one hour if needed 10/26/16  Melvenia Beam, MD  ONETOUCH DELICA LANCETS FINE MISC USE TO TEST BLOOD SUGAR FIVE TIMES DAILY AS NEEDED 07/27/16   Philemon Kingdom, MD  [facility] VERIO test strip Use 5x a day 10/14/16   Philemon Kingdom, MD  oxyCODONE-acetaminophen (PERCOCET/ROXICET) 5-325 MG tablet TAKE 1 TABLET BY MOUTH EVERY4-6 HOURS AS NEEDED FOR PAIN. 08/20/16   Historical Provider, MD  pregabalin (LYRICA) 150 MG capsule Take 1 capsule (150 mg total) by mouth 2 (two) times daily. 10/26/16   Melvenia Beam, MD  solifenacin (VESICARE) 5 MG tablet Take 5 mg by mouth at bedtime.     Historical Provider, MD  sulfaSALAzine (AZULFIDINE) 500 MG EC tablet TAKE 2 TABLETS BY MOUTH TWICE DAILY 12/15/16   Eliezer Lofts, PA-C    Family History Family History  Problem Relation Age of Onset  . Other Mother   . Other  Father     failure to thrive  . Hypertension Other   . Stroke Other   . Heart attack Other   . Hemachromatosis Other   . Rheum arthritis      both sets of grandparents and father    Social History Social History  Substance Use Topics  . Smoking status: Former Smoker    Packs/day: 3.00    Years: 45.00    Types: Cigarettes    Quit date: 12/05/1997  . Smokeless tobacco: Never Used  . Alcohol use No     Allergies   Carboplatin; Remicade [infliximab]; and Hydromorphone   Review of Systems Review of Systems  HENT: Negative for dental problem.   Eyes: Negative for visual disturbance.  Cardiovascular: Negative for chest pain.  Gastrointestinal: Negative for abdominal pain.  Musculoskeletal: Positive for myalgias (Right facial). Negative for neck pain.  Skin: Positive for wound (Right perorbital).  Neurological: Negative for syncope.     Physical Exam Updated Vital Signs BP 147/84 (BP Location: Right Arm)   Pulse 98   Temp 97.3 F (36.3 C) (Oral)   Resp 17   Ht 5' 5.5" (1.664 m)   Wt 74.8 kg   SpO2 94%   BMI 27.04 kg/m   Physical Exam  Constitutional: She appears well-developed and well-nourished. No distress.  HENT:  Head: Normocephalic.  Large abrasions superior to the right eye brow and lateral to right eye. Tenderness to right maxilla. EOM intact.   Eyes: Conjunctivae are normal.  Neck: Normal range of motion. Neck supple.  Cardiovascular: Normal rate.   Pulmonary/Chest: Effort normal. She exhibits no tenderness.  Abdominal: Soft. She exhibits no distension and no mass. There is no tenderness. There is no rebound and no guarding.  Musculoskeletal: Normal range of motion. She exhibits no tenderness.  Spine nontender, no crepitus, or stepoffs. RUE with tenderess and early ecchymosis to the ulnar aspect of the wrist and hand. Full active range of motion of all digits, strength 5/5, sensation intact, capillary refill < 2 seconds.   Neurological: She is alert. She  exhibits normal muscle tone. Coordination normal.  Moves all extremities equally.  No gross neurologic deficits.    Skin: She is not diaphoretic.  Psychiatric: She has a normal mood and affect. Her behavior is normal.  Nursing note and vitals reviewed.    ED Treatments / Results  DIAGNOSTIC STUDIES: .    COORDINATION OF CARE: 1:49 PM- Pt advised of plan for treatment and pt agrees.  Labs (all labs ordered are listed, but only abnormal results are displayed) Labs Reviewed  BASIC METABOLIC PANEL - Abnormal; Notable  for the following:       Result Value   Glucose, Bld 241 (*)    All other components within normal limits  CBC WITH DIFFERENTIAL/PLATELET - Abnormal; Notable for the following:    RDW 17.0 (*)    All other components within normal limits  PROTIME-INR  APTT    EKG  EKG Interpretation None       Radiology Dg Wrist Complete Right  Result Date: 01/11/2017 CLINICAL DATA:  [place] on concrete. Facial laceration and trauma. Wrist pain. EXAM: RIGHT WRIST - COMPLETE 3+ VIEW COMPARISON:  None. FINDINGS: Chronic osteoarthritic changes at the first carpal/metacarpal joint an the distal radioulnar joint. Joint space narrowing, osteophytes and loose bodies. No acute fracture. IMPRESSION: Chronic osteoarthritic changes as above.  No acute finding. Electronically Signed   By: Nelson Chimes M.D.   On: 01/11/2017 15:12   Ct Head Wo Contrast  Addendum Date: 01/11/2017   ADDENDUM REPORT: 01/11/2017 15:26 ADDENDUM: The original report was by Dr. Van Clines. The following addendum is by Dr. Van Clines: Critical Value/emergent results were called by telephone at the time of interpretation on 01/11/2017 at 3:23 pm to [place], PA-C , who verbally acknowledged these results. Electronically Signed   By: Van Clines M.D.   On: 01/11/2017 15:26   Result Date: 01/11/2017 CLINICAL DATA:  Fall down stairs on 2 cm and with gravel. Abrasion along the right face with bruising.  History of right upper lobe squamous cell carcinoma. EXAM: CT HEAD WITHOUT CONTRAST CT MAXILLOFACIAL WITHOUT CONTRAST CT CERVICAL SPINE WITHOUT CONTRAST TECHNIQUE: Multidetector CT imaging of the head, cervical spine, and maxillofacial structures were performed using the standard protocol without intravenous contrast. Multiplanar CT image reconstructions of the cervical spine and maxillofacial structures were also generated. COMPARISON:  Brain MRI dated 12/24/2013 FINDINGS: CT HEAD FINDINGS Brain: The brainstem, cerebellum, cerebral peduncles, thalami, basal ganglia, basilar cisterns, and ventricular system appear within normal limits. No mass lesion or acute CVA. On images 18-21 of series 15 there is density within a right parietal sulcus compatible with a trace amount of subarachnoid hemorrhage. Vascular: There is atherosclerotic calcification of the cavernous carotid arteries bilaterally. Skull: Unremarkable Other: No supplemental non-categorized findings. CT MAXILLOFACIAL FINDINGS Osseous: Degenerative sclerosis along the right temporomandibular joint. There is evidence of bilateral nasal fractures but given the relative lack of surrounding soft tissue swelling, these could be chronic/old. Correlate with bruising and tenderness. No orbital fracture or additional facial fractures identified Orbits: No intraorbital abnormality. Sinuses: Unremarkable Soft tissues: Right periorbital and right facial soft tissue swelling. CT CERVICAL SPINE FINDINGS Alignment: No vertebral subluxation is observed. Skull base and vertebrae: Spurring at the anterior C1- 2 articulation. Reduced intervertebral disc height at all levels between C3 and C7 with mild degenerative endplate findings. No cervical spine fracture is identified. Small left C7 cervical rib. Soft tissues and spinal canal: Unremarkable Disc levels: Uncinate spurring causes foraminal narrowing on the right at C3-4, C4-5, and C5-6 ; and on the left at C4-5 and C5-6, and  to a lesser extent at C6-7. Upper chest: Right apical scarring. Other: No supplemental non-categorized findings. IMPRESSION: 1. There is a trace amount of acute subarachnoid hemorrhage within a right parietal lobe sulcus (images 18-21 of series 15). This is a very minimal amount and only well seen on the parasagittal images. 2. Age indeterminate bilateral nasal bone fractures, correlate with any focal tenderness in determining whether these are acute. 3. Right periorbital and right facial soft tissue swelling. 4. Cervical  spondylosis and degenerative disc disease causing foraminal impingement at C3-4, C4-5, C5-6, and to a lesser extent at C6-7. Radiology assistant personnel have been notified to put me in telephone contact with the referring physician or the referring physician's clinical representative in order to discuss these findings. Once this communication is established I will issue an addendum to this report for documentation purposes. Electronically Signed: By: Van Clines M.D. On: 01/11/2017 15:20   Ct Cervical Spine Wo Contrast  Addendum Date: 01/11/2017   ADDENDUM REPORT: 01/11/2017 15:26 ADDENDUM: The original report was by Dr. Van Clines. The following addendum is by Dr. Van Clines: Critical Value/emergent results were called by telephone at the time of interpretation on 01/11/2017 at 3:23 pm to [place], PA-C , who verbally acknowledged these results. Electronically Signed   By: Van Clines M.D.   On: 01/11/2017 15:26   Result Date: 01/11/2017 CLINICAL DATA:  Fall down stairs on 2 cm and with gravel. Abrasion along the right face with bruising. History of right upper lobe squamous cell carcinoma. EXAM: CT HEAD WITHOUT CONTRAST CT MAXILLOFACIAL WITHOUT CONTRAST CT CERVICAL SPINE WITHOUT CONTRAST TECHNIQUE: Multidetector CT imaging of the head, cervical spine, and maxillofacial structures were performed using the standard protocol without intravenous contrast.  Multiplanar CT image reconstructions of the cervical spine and maxillofacial structures were also generated. COMPARISON:  Brain MRI dated 12/24/2013 FINDINGS: CT HEAD FINDINGS Brain: The brainstem, cerebellum, cerebral peduncles, thalami, basal ganglia, basilar cisterns, and ventricular system appear within normal limits. No mass lesion or acute CVA. On images 18-21 of series 15 there is density within a right parietal sulcus compatible with a trace amount of subarachnoid hemorrhage. Vascular: There is atherosclerotic calcification of the cavernous carotid arteries bilaterally. Skull: Unremarkable Other: No supplemental non-categorized findings. CT MAXILLOFACIAL FINDINGS Osseous: Degenerative sclerosis along the right temporomandibular joint. There is evidence of bilateral nasal fractures but given the relative lack of surrounding soft tissue swelling, these could be chronic/old. Correlate with bruising and tenderness. No orbital fracture or additional facial fractures identified Orbits: No intraorbital abnormality. Sinuses: Unremarkable Soft tissues: Right periorbital and right facial soft tissue swelling. CT CERVICAL SPINE FINDINGS Alignment: No vertebral subluxation is observed. Skull base and vertebrae: Spurring at the anterior C1- 2 articulation. Reduced intervertebral disc height at all levels between C3 and C7 with mild degenerative endplate findings. No cervical spine fracture is identified. Small left C7 cervical rib. Soft tissues and spinal canal: Unremarkable Disc levels: Uncinate spurring causes foraminal narrowing on the right at C3-4, C4-5, and C5-6 ; and on the left at C4-5 and C5-6, and to a lesser extent at C6-7. Upper chest: Right apical scarring. Other: No supplemental non-categorized findings. IMPRESSION: 1. There is a trace amount of acute subarachnoid hemorrhage within a right parietal lobe sulcus (images 18-21 of series 15). This is a very minimal amount and only well seen on the parasagittal  images. 2. Age indeterminate bilateral nasal bone fractures, correlate with any focal tenderness in determining whether these are acute. 3. Right periorbital and right facial soft tissue swelling. 4. Cervical spondylosis and degenerative disc disease causing foraminal impingement at C3-4, C4-5, C5-6, and to a lesser extent at C6-7. Radiology assistant personnel have been notified to put me in telephone contact with the referring physician or the referring physician's clinical representative in order to discuss these findings. Once this communication is established I will issue an addendum to this report for documentation purposes. Electronically Signed: By: Van Clines M.D. On: 01/11/2017 15:20  Dg Hand Complete Right  Result Date: 01/11/2017 CLINICAL DATA:  [place] on concrete.  Pain. EXAM: RIGHT HAND - COMPLETE 3+ VIEW COMPARISON:  None. FINDINGS: No evidence of fracture or dislocation. Chronic osteoarthritis at the first carpometacarpal joint and metacarpal phalangeal joint and affecting the disc radioulnar joint. IMPRESSION: No acute traumatic finding.  Chronic degenerative changes as above. Electronically Signed   By: Nelson Chimes M.D.   On: 01/11/2017 15:13   Ct Maxillofacial Wo Cm  Addendum Date: 01/11/2017   ADDENDUM REPORT: 01/11/2017 15:26 ADDENDUM: The original report was by Dr. Van Clines. The following addendum is by Dr. Van Clines: Critical Value/emergent results were called by telephone at the time of interpretation on 01/11/2017 at 3:23 pm to [place], PA-C , who verbally acknowledged these results. Electronically Signed   By: Van Clines M.D.   On: 01/11/2017 15:26   Result Date: 01/11/2017 CLINICAL DATA:  Fall down stairs on 2 cm and with gravel. Abrasion along the right face with bruising. History of right upper lobe squamous cell carcinoma. EXAM: CT HEAD WITHOUT CONTRAST CT MAXILLOFACIAL WITHOUT CONTRAST CT CERVICAL SPINE WITHOUT CONTRAST TECHNIQUE:  Multidetector CT imaging of the head, cervical spine, and maxillofacial structures were performed using the standard protocol without intravenous contrast. Multiplanar CT image reconstructions of the cervical spine and maxillofacial structures were also generated. COMPARISON:  Brain MRI dated 12/24/2013 FINDINGS: CT HEAD FINDINGS Brain: The brainstem, cerebellum, cerebral peduncles, thalami, basal ganglia, basilar cisterns, and ventricular system appear within normal limits. No mass lesion or acute CVA. On images 18-21 of series 15 there is density within a right parietal sulcus compatible with a trace amount of subarachnoid hemorrhage. Vascular: There is atherosclerotic calcification of the cavernous carotid arteries bilaterally. Skull: Unremarkable Other: No supplemental non-categorized findings. CT MAXILLOFACIAL FINDINGS Osseous: Degenerative sclerosis along the right temporomandibular joint. There is evidence of bilateral nasal fractures but given the relative lack of surrounding soft tissue swelling, these could be chronic/old. Correlate with bruising and tenderness. No orbital fracture or additional facial fractures identified Orbits: No intraorbital abnormality. Sinuses: Unremarkable Soft tissues: Right periorbital and right facial soft tissue swelling. CT CERVICAL SPINE FINDINGS Alignment: No vertebral subluxation is observed. Skull base and vertebrae: Spurring at the anterior C1- 2 articulation. Reduced intervertebral disc height at all levels between C3 and C7 with mild degenerative endplate findings. No cervical spine fracture is identified. Small left C7 cervical rib. Soft tissues and spinal canal: Unremarkable Disc levels: Uncinate spurring causes foraminal narrowing on the right at C3-4, C4-5, and C5-6 ; and on the left at C4-5 and C5-6, and to a lesser extent at C6-7. Upper chest: Right apical scarring. Other: No supplemental non-categorized findings. IMPRESSION: 1. There is a trace amount of acute  subarachnoid hemorrhage within a right parietal lobe sulcus (images 18-21 of series 15). This is a very minimal amount and only well seen on the parasagittal images. 2. Age indeterminate bilateral nasal bone fractures, correlate with any focal tenderness in determining whether these are acute. 3. Right periorbital and right facial soft tissue swelling. 4. Cervical spondylosis and degenerative disc disease causing foraminal impingement at C3-4, C4-5, C5-6, and to a lesser extent at C6-7. Radiology assistant personnel have been notified to put me in telephone contact with the referring physician or the referring physician's clinical representative in order to discuss these findings. Once this communication is established I will issue an addendum to this report for documentation purposes. Electronically Signed: By: Van Clines M.D. On: 01/11/2017 15:20  Procedures Procedures (including critical care time)  Medications Ordered in ED Medications  Tdap (BOOSTRIX) injection 0.5 mL (0.5 mLs Intramuscular Given 01/11/17 1405)  lidocaine-EPINEPHrine-tetracaine (LET) solution (3 mLs Topical Given 01/11/17 1532)  bacitracin ointment (1 application Topical Given 01/11/17 1617)     Initial Impression / Assessment and Plan / ED Course  I have reviewed the triage vital signs and the nursing notes.  Pertinent labs & imaging results that were available during my care of the patient were reviewed by me and considered in my medical decision making (see chart for details).  Clinical Course as of Jan 12 1744  Wed Jan 11, 2017  1720 I spoke with Dr Cyndy Freeze per hospitalist Dr Graciela Husbands request.  Pt may be admitted at [facility] with neuro checks.  No repeat CT unless patient develops neurologic symptoms.    [EW]    Clinical Course User Index [EW] Clayton Bibles, PA-C    Afebrile, nontoxic patient with injury to her right face and right hand/wrist after mechanical fall.  Dr Laverta [place], ED attending, also saw  patient.  CT head, maxillofacial,c-spine significant for small subarachnoid hemorrhage.   Xray right right and hand negative.  Tdap updated.  Wound care performed by nurse.  Admitted for observation to [facility], Dr Oda Cogan accepting.  Discussed pt with Dr Cyndy Freeze who provided recommendations as above.    Final Clinical Impressions(s) / ED Diagnoses   Final diagnoses:  Fall, initial encounter  SAH (subarachnoid hemorrhage) ([place])    New Prescriptions New Prescriptions   No medications on file   I personally performed the services described in this documentation, which was scribed in my presence. The recorded information has been reviewed and is accurate.    Clayton Bibles, PA-C 01/11/17 1815    Margette Fast, MD 01/11/17 2011

## 2017-01-11 NOTE — Progress Notes (Signed)
Nursing Note: cbg [id number].A: paged on-call and ordered stat glucose per protocol

## 2017-01-11 NOTE — ED Notes (Signed)
Hospitalist at bedside 

## 2017-01-11 NOTE — H&P (Signed)
TRH H&P   Patient Demographics:    Megan [name], is a 76 y.o. female  MRN: [id number]   DOB - April 07, 1941  Admit Date - 01/11/2017  Outpatient Primary MD for the patient is Binnie Rail, MD  Referring MD/NP/PA: PA [place]  Outpatient Specialists: Oncology Dr. Earlie Server  Patient coming from: Home  Chief Complaint  Patient presents with  . Fall  . Facial Injury      HPI:    Megan [name]  is a 77 y.o. female, With past medical history of lung cancer, diabetes mellitus, psoriatic arthritis, neuropathy, she presents to as long ED with mechanical fall and facial injury, patient reports she was walking down steps when someone called her name, she turned around, lost her footing and fell to the right side on renal cement, she denies syncope, lightheadedness or dizziness preceding the fall, as well denies any loss of consciousness,, as well she reports right wrist and hand pain, NAD no evidence of fracture and right hand and wrist x-ray, there was trace amount of subarachnoid hemorrhage, questionable nasal fracture, she denies any headache, visual problems, chest pain, shortness of breath, neck pain, fever chills or cough, significant labs abnormalities, hospitalist requested to admit the patient for observation for her subarachnoid hemorrhage.    Review of systems:    In addition to the HPI above,  No Fever-chills, No Headache, No changes with Vision or hearing, No problems swallowing food or Liquids, No Chest pain, Cough or Shortness of Breath, No Abdominal pain, No Nausea or Vommitting, Bowel movements are regular, No Blood in stool or Urine, No dysuria, No new skin rashes or bruises, Complaints of right-sided facial pain from fall, as well right hand and wrist pain No new weakness, tingling, numbness in any extremity, No recent weight gain or loss, No polyuria,  polydypsia or polyphagia, No significant Mental Stressors.  A full 10 point Review of Systems was done, except as stated above, all other Review of Systems were negative.   With Past History of the following :    Past Medical History:  Diagnosis Date  . Cancer ([place]) dx'd 09/2013   Lung ca  . Diabetes ([place]) 09/08/2016   type I diabetic patient reported on 09/13/16   . Diabetes mellitus    insulin  . Diabetic neuropathy ([place]) 09/08/2016  . GERD (gastroesophageal reflux disease)    no meds for  . History of hiatal hernia   . Hx of radiation therapy 12/16/13-01/30/14   lung 66Gy  . Hypertension   . Malignant neoplasm of right upper lobe of lung ([place]) 11/28/2013  . Neuropathy ([place])   . Psoriasis   . Psoriatic arthritis ([place]) 09/08/2016  . Rheumatoid arthritis ([place])   . Shortness of breath dyspnea    with exertion      Past Surgical History:  Procedure Laterality Date  . ABDOMINAL HYSTERECTOMY    .  ABDOMINAL SURGERY    . BALLOON DILATION N/A 12/12/2014   Procedure: BALLOON DILATION;  Surgeon: Inda Castle, MD;  Location: [facility];  Service: Endoscopy;  Laterality: N/A;  . CHOLECYSTECTOMY    . COLONOSCOPY N/A 02/07/2016   Procedure: COLONOSCOPY;  Surgeon: Doran Stabler, MD;  Location: [facility] ENDOSCOPY;  Service: Endoscopy;  Laterality: N/A;  . DILATION AND CURETTAGE OF UTERUS    . ESOPHAGOGASTRODUODENOSCOPY N/A 12/12/2014   Procedure: ESOPHAGOGASTRODUODENOSCOPY (EGD);  Surgeon: Inda Castle, MD;  Location: [facility];  Service: Endoscopy;  Laterality: N/A;  with dilation  . ESOPHAGOGASTRODUODENOSCOPY (EGD) WITH PROPOFOL N/A 10/09/2014   Procedure: ESOPHAGOGASTRODUODENOSCOPY (EGD) WITH PROPOFOL;  Surgeon: Inda Castle, MD;  Location: [place];  Service: Endoscopy;  Laterality: N/A;  . ESOPHAGOGASTRODUODENOSCOPY (EGD) WITH PROPOFOL N/A 09/19/2016   Procedure: ESOPHAGOGASTRODUODENOSCOPY (EGD) WITH PROPOFOL;  Surgeon: Doran Stabler, MD;  Location: [facility] ENDOSCOPY;   Service: Gastroenterology;  Laterality: N/A;  with savary dil tt  . FRACTURE SURGERY    . ORIF ANKLE FRACTURE Right 07/04/2015   Procedure: OPEN REDUCTION INTERNAL FIXATION (ORIF) ANKLE FRACTURE;  Surgeon: Newt Minion, MD;  Location: [place];  Service: Orthopedics;  Laterality: Right;  OPEN REDUCTION, INTERNAL FIXATION OF RIGHT ANKLE FRACTURE.   [name] ORIF ANKLE FRACTURE Left 02/10/2016   Procedure: OPEN REDUCTION INTERNAL FIXATION (ORIF) ANKLE FRACTURE;  Surgeon: Newt Minion, MD;  Location: [place];  Service: Orthopedics;  Laterality: Left;  . SAVORY DILATION N/A 10/09/2014   Procedure: SAVORY DILATION;  Surgeon: Inda Castle, MD;  Location: [place];  Service: Endoscopy;  Laterality: N/A;  . SAVORY DILATION N/A 09/19/2016   Procedure: SAVORY DILATION;  Surgeon: Doran Stabler, MD;  Location: [facility] ENDOSCOPY;  Service: Gastroenterology;  Laterality: N/A;  . TONSILLECTOMY        Social History:     Social History  Substance Use Topics  . Smoking status: Former Smoker    Packs/day: 3.00    Years: 45.00    Types: Cigarettes    Quit date: 12/05/1997  . Smokeless tobacco: Never Used  . Alcohol use No     Lives - At home  Mobility - independent     Family History :     Family History  Problem Relation Age of Onset  . Other Mother   . Other Father     failure to thrive  . Hypertension Other   . Stroke Other   . Heart attack Other   . Hemachromatosis Other   . Rheum arthritis      both sets of grandparents and father      Home Medications:   Prior to Admission medications   Medication Sig Start Date End Date Taking? Authorizing Provider  B-D UF III MINI PEN NEEDLES 31G X 5 MM MISC U TO CHECK SUGAR FID 07/25/16   Historical Provider, MD  B-D UF III MINI PEN NEEDLES 31G X 5 MM MISC USE TO CHECK SUGAR FIVE TIMES DAILY 12/15/16   Philemon Kingdom, MD  DULoxetine (CYMBALTA) 60 MG capsule Take 1 capsule (60 mg total) by mouth 2 (two) times daily. 10/26/16   Melvenia Beam, MD   fluticasone-salmeterol (ADVAIR HFA) [phone] MCG/ACT inhaler Inhale 2 puffs into the lungs 2 (two) times daily as needed.     Historical Provider, MD  Insulin Detemir (LEVEMIR FLEXPEN) 100 UNIT/ML Pen Inject 30 units into the skin at bedtime. Patient taking differently: 20 Units. Inject 30 units into the skin  at bedtime. 10/01/16   Doristine Devoid, PA-C  insulin lispro (HUMALOG KWIKPEN) 100 UNIT/ML KiwkPen INJECT 8-20 units 3x a day under skin Patient taking differently: Inject 8-20 Units into the skin 4 (four) times daily. Per patient sliding scale. 06/16/16   Philemon Kingdom, MD  LORazepam (ATIVAN) 0.5 MG tablet Take one tablet at onset of panic attack, may repeat in 30 minutes to one hour if needed 10/26/16   Melvenia Beam, MD  [facility] DELICA LANCETS FINE MISC USE TO TEST BLOOD SUGAR FIVE TIMES DAILY AS NEEDED 07/27/16   Philemon Kingdom, MD  [facility] VERIO test strip Use 5x a day 10/14/16   Philemon Kingdom, MD  oxyCODONE-acetaminophen (PERCOCET/ROXICET) 5-325 MG tablet TAKE 1 TABLET BY MOUTH EVERY4-6 HOURS AS NEEDED FOR PAIN. 08/20/16   Historical Provider, MD  pregabalin (LYRICA) 150 MG capsule Take 1 capsule (150 mg total) by mouth 2 (two) times daily. 10/26/16   Melvenia Beam, MD  solifenacin (VESICARE) 5 MG tablet Take 5 mg by mouth at bedtime.     Historical Provider, MD  sulfaSALAzine (AZULFIDINE) 500 MG EC tablet TAKE 2 TABLETS BY MOUTH TWICE DAILY 12/15/16   Eliezer Lofts, PA-C     Allergies:     Allergies  Allergen Reactions  . Carboplatin Other (See Comments)    Neuropathy  . Remicade [Infliximab] Anaphylaxis  . Hydromorphone Rash     Physical Exam:   Vitals  Blood pressure 147/84, pulse 98, temperature 97.3 F (36.3 C), temperature source Oral, resp. rate 17, height 5' 5.5" (1.664 m), weight 74.8 kg (165 lb), SpO2 94 %.   1. General Well-developed female lying in bed in NAD,   2. Normal affect and insight, Not Suicidal or Homicidal, Awake Alert, Oriented X  3.  3. No F.N deficits, ALL C.Nerves Intact, Strength 5/5 all 4 extremities, Sensation intact all 4 extremities, Plantars down going.  4. Ears and Eyes appear Normal, Conjunctivae clear, PERRLA. Moist Oral Mucosa. Patient with bruise/skin laceration in the right periorbital area.  5. Supple Neck, No JVD, No cervical lymphadenopathy appriciated, No Carotid Bruits.  6. Symmetrical Chest wall movement, Good air movement bilaterally, CTAB.  7. RRR, No Gallops, Rubs or Murmurs, No Parasternal Heave.(Chronic right foot swelling)  8. Positive Bowel Sounds, Abdomen Soft, No tenderness, No organomegaly appriciated,No rebound -guarding or rigidity.  9.  No Cyanosis, Normal Skin Turgor, No Skin Rash or Bruise.  10. Good muscle tone,  joints appear normal , no effusions, Normal ROM.    Data Review:    CBC  Recent Labs Lab 01/11/17 1549  WBC 9.1  HGB 13.1  HCT 41.0  PLT 183  MCV 90.5  MCH 28.9  MCHC 32.0  RDW 17.0*  LYMPHSABS 1.6  MONOABS 0.6  EOSABS 0.5  BASOSABS 0.1   ------------------------------------------------------------------------------------------------------------------  Chemistries   Recent Labs Lab 01/11/17 1549  NA 138  K 3.7  CL 104  CO2 27  GLUCOSE 241*  BUN 20  CREATININE 0.73  CALCIUM 9.1   ------------------------------------------------------------------------------------------------------------------ estimated creatinine clearance is 62.2 mL/min (by C-G formula based on SCr of 0.73 mg/dL). ------------------------------------------------------------------------------------------------------------------ No results for input(s): TSH, T4TOTAL, T3FREE, THYROIDAB in the last 72 hours.  Invalid input(s): FREET3  Coagulation profile  Recent Labs Lab 01/11/17 1549  INR 0.88   ------------------------------------------------------------------------------------------------------------------- No results for input(s): DDIMER in the last 72  hours. -------------------------------------------------------------------------------------------------------------------  Cardiac Enzymes No results for input(s): CKMB, TROPONINI, MYOGLOBIN in the last 168 hours.  Invalid input(s): CK ------------------------------------------------------------------------------------------------------------------ No results found  for: BNP   ---------------------------------------------------------------------------------------------------------------  Urinalysis    Component Value Date/Time   COLORURINE YELLOW 10/01/2016 [place] 10/01/2016 1204   LABSPEC 1.031 (H) 10/01/2016 1204   LABSPEC 1.030 08/25/2015 1130   PHURINE 6.5 10/01/2016 1204   GLUCOSEU >1000 (A) 10/01/2016 1204   GLUCOSEU Negative 08/25/2015 1130   HGBUR NEGATIVE 10/01/2016 1204   BILIRUBINUR NEGATIVE 10/01/2016 1204   BILIRUBINUR Negative 08/25/2015 1130   KETONESUR 15 (A) 10/01/2016 1204   PROTEINUR NEGATIVE 10/01/2016 1204   UROBILINOGEN 0.2 08/25/2015 1130   NITRITE NEGATIVE 10/01/2016 1204   LEUKOCYTESUR NEGATIVE 10/01/2016 1204   LEUKOCYTESUR Negative 08/25/2015 1130    ----------------------------------------------------------------------------------------------------------------   Imaging Results:    Dg Wrist Complete Right  Result Date: 01/11/2017 CLINICAL DATA:  [place] on concrete. Facial laceration and trauma. Wrist pain. EXAM: RIGHT WRIST - COMPLETE 3+ VIEW COMPARISON:  None. FINDINGS: Chronic osteoarthritic changes at the first carpal/metacarpal joint an the distal radioulnar joint. Joint space narrowing, osteophytes and loose bodies. No acute fracture. IMPRESSION: Chronic osteoarthritic changes as above.  No acute finding. Electronically Signed   By: Nelson Chimes M.D.   On: 01/11/2017 15:12   Ct Head Wo Contrast  Addendum Date: 01/11/2017   ADDENDUM REPORT: 01/11/2017 15:26 ADDENDUM: The original report was by Dr. Van Clines. The  following addendum is by Dr. Van Clines: Critical Value/emergent results were called by telephone at the time of interpretation on 01/11/2017 at 3:23 pm to [place], PA-C , who verbally acknowledged these results. Electronically Signed   By: Van Clines M.D.   On: 01/11/2017 15:26   Result Date: 01/11/2017 CLINICAL DATA:  Fall down stairs on 2 cm and with gravel. Abrasion along the right face with bruising. History of right upper lobe squamous cell carcinoma. EXAM: CT HEAD WITHOUT CONTRAST CT MAXILLOFACIAL WITHOUT CONTRAST CT CERVICAL SPINE WITHOUT CONTRAST TECHNIQUE: Multidetector CT imaging of the head, cervical spine, and maxillofacial structures were performed using the standard protocol without intravenous contrast. Multiplanar CT image reconstructions of the cervical spine and maxillofacial structures were also generated. COMPARISON:  Brain MRI dated 12/24/2013 FINDINGS: CT HEAD FINDINGS Brain: The brainstem, cerebellum, cerebral peduncles, thalami, basal ganglia, basilar cisterns, and ventricular system appear within normal limits. No mass lesion or acute CVA. On images 18-21 of series 15 there is density within a right parietal sulcus compatible with a trace amount of subarachnoid hemorrhage. Vascular: There is atherosclerotic calcification of the cavernous carotid arteries bilaterally. Skull: Unremarkable Other: No supplemental non-categorized findings. CT MAXILLOFACIAL FINDINGS Osseous: Degenerative sclerosis along the right temporomandibular joint. There is evidence of bilateral nasal fractures but given the relative lack of surrounding soft tissue swelling, these could be chronic/old. Correlate with bruising and tenderness. No orbital fracture or additional facial fractures identified Orbits: No intraorbital abnormality. Sinuses: Unremarkable Soft tissues: Right periorbital and right facial soft tissue swelling. CT CERVICAL SPINE FINDINGS Alignment: No vertebral subluxation is observed.  Skull base and vertebrae: Spurring at the anterior C1- 2 articulation. Reduced intervertebral disc height at all levels between C3 and C7 with mild degenerative endplate findings. No cervical spine fracture is identified. Small left C7 cervical rib. Soft tissues and spinal canal: Unremarkable Disc levels: Uncinate spurring causes foraminal narrowing on the right at C3-4, C4-5, and C5-6 ; and on the left at C4-5 and C5-6, and to a lesser extent at C6-7. Upper chest: Right apical scarring. Other: No supplemental non-categorized findings. IMPRESSION: 1. There is a trace amount of acute subarachnoid hemorrhage within  a right parietal lobe sulcus (images 18-21 of series 15). This is a very minimal amount and only well seen on the parasagittal images. 2. Age indeterminate bilateral nasal bone fractures, correlate with any focal tenderness in determining whether these are acute. 3. Right periorbital and right facial soft tissue swelling. 4. Cervical spondylosis and degenerative disc disease causing foraminal impingement at C3-4, C4-5, C5-6, and to a lesser extent at C6-7. Radiology assistant personnel have been notified to put me in telephone contact with the referring physician or the referring physician's clinical representative in order to discuss these findings. Once this communication is established I will issue an addendum to this report for documentation purposes. Electronically Signed: By: Van Clines M.D. On: 01/11/2017 15:20   Ct Cervical Spine Wo Contrast  Addendum Date: 01/11/2017   ADDENDUM REPORT: 01/11/2017 15:26 ADDENDUM: The original report was by Dr. Van Clines. The following addendum is by Dr. Van Clines: Critical Value/emergent results were called by telephone at the time of interpretation on 01/11/2017 at 3:23 pm to [place], PA-C , who verbally acknowledged these results. Electronically Signed   By: Van Clines M.D.   On: 01/11/2017 15:26   Result Date:  01/11/2017 CLINICAL DATA:  Fall down stairs on 2 cm and with gravel. Abrasion along the right face with bruising. History of right upper lobe squamous cell carcinoma. EXAM: CT HEAD WITHOUT CONTRAST CT MAXILLOFACIAL WITHOUT CONTRAST CT CERVICAL SPINE WITHOUT CONTRAST TECHNIQUE: Multidetector CT imaging of the head, cervical spine, and maxillofacial structures were performed using the standard protocol without intravenous contrast. Multiplanar CT image reconstructions of the cervical spine and maxillofacial structures were also generated. COMPARISON:  Brain MRI dated 12/24/2013 FINDINGS: CT HEAD FINDINGS Brain: The brainstem, cerebellum, cerebral peduncles, thalami, basal ganglia, basilar cisterns, and ventricular system appear within normal limits. No mass lesion or acute CVA. On images 18-21 of series 15 there is density within a right parietal sulcus compatible with a trace amount of subarachnoid hemorrhage. Vascular: There is atherosclerotic calcification of the cavernous carotid arteries bilaterally. Skull: Unremarkable Other: No supplemental non-categorized findings. CT MAXILLOFACIAL FINDINGS Osseous: Degenerative sclerosis along the right temporomandibular joint. There is evidence of bilateral nasal fractures but given the relative lack of surrounding soft tissue swelling, these could be chronic/old. Correlate with bruising and tenderness. No orbital fracture or additional facial fractures identified Orbits: No intraorbital abnormality. Sinuses: Unremarkable Soft tissues: Right periorbital and right facial soft tissue swelling. CT CERVICAL SPINE FINDINGS Alignment: No vertebral subluxation is observed. Skull base and vertebrae: Spurring at the anterior C1- 2 articulation. Reduced intervertebral disc height at all levels between C3 and C7 with mild degenerative endplate findings. No cervical spine fracture is identified. Small left C7 cervical rib. Soft tissues and spinal canal: Unremarkable Disc levels:  Uncinate spurring causes foraminal narrowing on the right at C3-4, C4-5, and C5-6 ; and on the left at C4-5 and C5-6, and to a lesser extent at C6-7. Upper chest: Right apical scarring. Other: No supplemental non-categorized findings. IMPRESSION: 1. There is a trace amount of acute subarachnoid hemorrhage within a right parietal lobe sulcus (images 18-21 of series 15). This is a very minimal amount and only well seen on the parasagittal images. 2. Age indeterminate bilateral nasal bone fractures, correlate with any focal tenderness in determining whether these are acute. 3. Right periorbital and right facial soft tissue swelling. 4. Cervical spondylosis and degenerative disc disease causing foraminal impingement at C3-4, C4-5, C5-6, and to a lesser extent at C6-7. Radiology assistant  personnel have been notified to put me in telephone contact with the referring physician or the referring physician's clinical representative in order to discuss these findings. Once this communication is established I will issue an addendum to this report for documentation purposes. Electronically Signed: By: Van Clines M.D. On: 01/11/2017 15:20   Dg Hand Complete Right  Result Date: 01/11/2017 CLINICAL DATA:  [place] on concrete.  Pain. EXAM: RIGHT HAND - COMPLETE 3+ VIEW COMPARISON:  None. FINDINGS: No evidence of fracture or dislocation. Chronic osteoarthritis at the first carpometacarpal joint and metacarpal phalangeal joint and affecting the disc radioulnar joint. IMPRESSION: No acute traumatic finding.  Chronic degenerative changes as above. Electronically Signed   By: Nelson Chimes M.D.   On: 01/11/2017 15:13   Ct Maxillofacial Wo Cm  Addendum Date: 01/11/2017   ADDENDUM REPORT: 01/11/2017 15:26 ADDENDUM: The original report was by Dr. Van Clines. The following addendum is by Dr. Van Clines: Critical Value/emergent results were called by telephone at the time of interpretation on 01/11/2017 at 3:23 pm to  [place], PA-C , who verbally acknowledged these results. Electronically Signed   By: Van Clines M.D.   On: 01/11/2017 15:26   Result Date: 01/11/2017 CLINICAL DATA:  Fall down stairs on 2 cm and with gravel. Abrasion along the right face with bruising. History of right upper lobe squamous cell carcinoma. EXAM: CT HEAD WITHOUT CONTRAST CT MAXILLOFACIAL WITHOUT CONTRAST CT CERVICAL SPINE WITHOUT CONTRAST TECHNIQUE: Multidetector CT imaging of the head, cervical spine, and maxillofacial structures were performed using the standard protocol without intravenous contrast. Multiplanar CT image reconstructions of the cervical spine and maxillofacial structures were also generated. COMPARISON:  Brain MRI dated 12/24/2013 FINDINGS: CT HEAD FINDINGS Brain: The brainstem, cerebellum, cerebral peduncles, thalami, basal ganglia, basilar cisterns, and ventricular system appear within normal limits. No mass lesion or acute CVA. On images 18-21 of series 15 there is density within a right parietal sulcus compatible with a trace amount of subarachnoid hemorrhage. Vascular: There is atherosclerotic calcification of the cavernous carotid arteries bilaterally. Skull: Unremarkable Other: No supplemental non-categorized findings. CT MAXILLOFACIAL FINDINGS Osseous: Degenerative sclerosis along the right temporomandibular joint. There is evidence of bilateral nasal fractures but given the relative lack of surrounding soft tissue swelling, these could be chronic/old. Correlate with bruising and tenderness. No orbital fracture or additional facial fractures identified Orbits: No intraorbital abnormality. Sinuses: Unremarkable Soft tissues: Right periorbital and right facial soft tissue swelling. CT CERVICAL SPINE FINDINGS Alignment: No vertebral subluxation is observed. Skull base and vertebrae: Spurring at the anterior C1- 2 articulation. Reduced intervertebral disc height at all levels between C3 and C7 with mild degenerative  endplate findings. No cervical spine fracture is identified. Small left C7 cervical rib. Soft tissues and spinal canal: Unremarkable Disc levels: Uncinate spurring causes foraminal narrowing on the right at C3-4, C4-5, and C5-6 ; and on the left at C4-5 and C5-6, and to a lesser extent at C6-7. Upper chest: Right apical scarring. Other: No supplemental non-categorized findings. IMPRESSION: 1. There is a trace amount of acute subarachnoid hemorrhage within a right parietal lobe sulcus (images 18-21 of series 15). This is a very minimal amount and only well seen on the parasagittal images. 2. Age indeterminate bilateral nasal bone fractures, correlate with any focal tenderness in determining whether these are acute. 3. Right periorbital and right facial soft tissue swelling. 4. Cervical spondylosis and degenerative disc disease causing foraminal impingement at C3-4, C4-5, C5-6, and to a lesser extent at C6-7.  Radiology assistant personnel have been notified to put me in telephone contact with the referring physician or the referring physician's clinical representative in order to discuss these findings. Once this communication is established I will issue an addendum to this report for documentation purposes. Electronically Signed: By: Van Clines M.D. On: 01/11/2017 15:20      Assessment & Plan:    Active Problems:   Cancer of middle lobe of lung (HCC)   Depression   Diabetic neuropathy (HCC)   Diabetes mellitus ([place])   Subarachnoid bleed ([place])   Subarachnoid hemorrhage - There is a trace amount of subarachnoid hemorrhage within the right parietal lobe sulcus, very minimal, secondary to mechanical fall, ED discussed with neurosurgery Dr. Cyndy Freeze who recommended admission overnight for observation with neuro checks, if no change in neurological status, and no complaints she can be discharged in a.m., she can be admitted to [facility] long and no need to repeat imaging in absence of symptoms. - There  is finding of nasal fracture on imaging, No no tenderness on palpation no swelling or bruising to indicate an acute nasal fracture.  Diabetes mellitus - Continue with home dose Levemir 20 units at bedtime, as will continue with insulin sliding scale and carb modified diet  History of stage III squamous cell cancer - Patient is following with Dr. Inda Merlin, she reports she had lab and imaging scheduled in 2 weeks  Depression - Continue with Cymbalta  Psoriatic arthritis - Continue with sulfasalazine  Neuropathy - Continue to diabetes and chemotherapy, continue with Cecille Rubin,  DVT Prophylaxis  SCDs   AM Labs Ordered, also please review Full Orders  Family Communication: Admission, patients condition and plan of care including tests being ordered have been discussed with the patient  who indicate understanding and agree with the plan and Code Status.  Code Status full  Likely DC to  Home  Condition GUARDED    Consults called: ED D/W neurosurgery Dr Cyndy Freeze  Admission status: Observation  Time spent in minutes : 55 minutes   Omarri Eich M.D on 01/11/2017 at 5:54 PM  Between 7am to 7pm - Pager - [phone]. After 7pm go to [email or web address] - password [facility]  [facility] - Office  [phone]

## 2017-01-11 NOTE — ED Triage Notes (Signed)
Pt reports she was going down the steps, someone called her name startling her.  Turned to the right and fell on a cement with gravel.  Denies LOC.  Large abrasion noted on R side of her R eye.  Bruising noted.  Pt is A&Ox 4.  Denies is h/a or dizziness at this time.  Pt also reports pain on R top of her head but does not remember hitting that area.  Denies taking anticoagulants

## 2017-01-12 ENCOUNTER — Observation Stay (HOSPITAL_COMMUNITY): Payer: Medicare Other

## 2017-01-12 DIAGNOSIS — E1140 Type 2 diabetes mellitus with diabetic neuropathy, unspecified: Secondary | ICD-10-CM | POA: Diagnosis not present

## 2017-01-12 DIAGNOSIS — E1149 Type 2 diabetes mellitus with other diabetic neurological complication: Secondary | ICD-10-CM | POA: Diagnosis not present

## 2017-01-12 DIAGNOSIS — C342 Malignant neoplasm of middle lobe, bronchus or lung: Secondary | ICD-10-CM | POA: Diagnosis not present

## 2017-01-12 DIAGNOSIS — I609 Nontraumatic subarachnoid hemorrhage, unspecified: Secondary | ICD-10-CM | POA: Diagnosis not present

## 2017-01-12 LAB — BASIC METABOLIC PANEL
ANION GAP: 5 (ref 5–15)
BUN: 15 mg/dL (ref 6–20)
CALCIUM: 8.5 mg/dL — AB (ref 8.9–10.3)
CO2: 28 mmol/L (ref 22–32)
Chloride: 104 mmol/L (ref 101–111)
Creatinine, Ser: 0.76 mg/dL (ref 0.44–1.00)
GFR calc non Af Amer: >60 mL/min (ref >60)
Glucose, Bld: 269 mg/dL — ABNORMAL HIGH (ref 65–99)
Potassium: 3.8 mmol/L (ref 3.5–5.1)
Sodium: 137 mmol/L (ref 135–145)

## 2017-01-12 LAB — GLUCOSE, CAPILLARY
GLUCOSE-CAPILLARY: 277 mg/dL — AB (ref 65–99)
GLUCOSE-CAPILLARY: 246 mg/dL — AB (ref 65–99)
Glucose-Capillary: 173 mg/dL — ABNORMAL HIGH (ref 65–99)

## 2017-01-12 LAB — CBC
HCT: 35.6 % — ABNORMAL LOW (ref 36.0–46.0)
HEMOGLOBIN: 11.3 g/dL — AB (ref 12.0–15.0)
MCH: 28 pg (ref 26.0–34.0)
MCHC: 31.7 g/dL (ref 30.0–36.0)
MCV: 88.3 fL (ref 78.0–100.0)
Platelets: 148 10*3/uL — ABNORMAL LOW (ref 150–400)
RBC: 4.03 MIL/uL (ref 3.87–5.11)
RDW: 16.8 % — ABNORMAL HIGH (ref 11.5–15.5)
WBC: 6 10*3/uL (ref 4.0–10.5)

## 2017-01-12 MED ORDER — CEPHALEXIN 500 MG PO CAPS: 500.0000 mg | ORAL_CAPSULE | Freq: Two times a day (BID) | ORAL | 0 refills | Status: AC

## 2017-01-12 MED ORDER — TRIPLE ANTIBIOTIC 5-400-5000 EX OINT: TOPICAL_OINTMENT | Freq: Four times a day (QID) | CUTANEOUS | 0 refills | Status: DC

## 2017-01-12 MED ORDER — CEPHALEXIN 500 MG PO CAPS: 500.0000 mg | ORAL_CAPSULE | ORAL | Status: DC

## 2017-01-12 MED ORDER — SACCHAROMYCES BOULARDII 250 MG PO CAPS
250.0000 mg | ORAL_CAPSULE | Freq: Two times a day (BID) | ORAL | Status: DC
  Administered 2017-01-12: 250 mg via ORAL
  Filled 2017-01-12: qty 1

## 2017-01-12 MED ORDER — OXYCODONE-ACETAMINOPHEN 5-325 MG PO TABS
2.0000 | ORAL_TABLET | ORAL | Status: AC
  Administered 2017-01-12: 2 via ORAL
  Filled 2017-01-12: qty 2

## 2017-01-12 MED ORDER — BACITRACIN ZINC 500 UNIT/GM EX OINT
TOPICAL_OINTMENT | Freq: Three times a day (TID) | CUTANEOUS | Status: DC
  Administered 2017-01-12: 16.6667 via TOPICAL
  Filled 2017-01-12: qty 28.35

## 2017-01-12 MED ORDER — SACCHAROMYCES BOULARDII 250 MG PO CAPS: 250.0000 mg | ORAL_CAPSULE | Freq: Two times a day (BID) | ORAL | 0 refills | Status: DC

## 2017-01-12 MED ORDER — CEPHALEXIN 500 MG PO CAPS: 500.0000 mg | ORAL_CAPSULE | Freq: Two times a day (BID) | ORAL | Status: DC

## 2017-01-12 MED ORDER — INSULIN DETEMIR 100 UNIT/ML FLEXPEN: PEN_INJECTOR | SUBCUTANEOUS | 2 refills | Status: DC

## 2017-01-12 NOTE — Discharge Summary (Signed)
Megan [name], is a 76 y.o. female  DOB 08-09-1941  MRN [id number].  Admission date:  01/11/2017  Admitting Physician  Albertine Patricia, MD  Discharge Date:  01/12/2017   Primary MD  Binnie Rail, MD  Recommendations for primary care physician for things to follow:  - Please check CBC, BMP daily next visit - [place]unt to keep her follow-up appointment with oncology Dr. Inda Merlin   Admission Diagnosis  SAH (subarachnoid hemorrhage) ([place]) [I60.9] Fall, initial encounter [W19.XXXA]   Discharge Diagnosis  SAH (subarachnoid hemorrhage) ([place]) [I60.9] Fall, initial encounter [W19.XXXA]    Active Problems:   Cancer of middle lobe of lung (HCC)   Depression   Diabetic neuropathy (HCC)   Diabetes mellitus ([place])   Subarachnoid bleed ([place])      Past Medical History:  Diagnosis Date  . Cancer ([place]) dx'd 09/2013   Lung ca  . Diabetes ([place]) 09/08/2016   type I diabetic patient reported on 09/13/16   . Diabetes mellitus    insulin  . Diabetic neuropathy ([place]) 09/08/2016  . GERD (gastroesophageal reflux disease)    no meds for  . History of hiatal hernia   . Hx of radiation therapy 12/16/13-01/30/14   lung 66Gy  . Hypertension   . Malignant neoplasm of right upper lobe of lung ([place]) 11/28/2013  . Neuropathy ([place])   . Psoriasis   . Psoriatic arthritis ([place]) 09/08/2016  . Rheumatoid arthritis ([place])   . Shortness of breath dyspnea    with exertion    Past Surgical History:  Procedure Laterality Date  . ABDOMINAL HYSTERECTOMY    . ABDOMINAL SURGERY    . BALLOON DILATION N/A 12/12/2014   Procedure: BALLOON DILATION;  Surgeon: Inda Castle, MD;  Location: [facility];  Service: Endoscopy;  Laterality: N/A;  . CHOLECYSTECTOMY    . COLONOSCOPY N/A 02/07/2016   Procedure: COLONOSCOPY;  Surgeon: Doran Stabler, MD;  Location: [facility] ENDOSCOPY;  Service: Endoscopy;  Laterality: N/A;  . DILATION AND  CURETTAGE OF UTERUS    . ESOPHAGOGASTRODUODENOSCOPY N/A 12/12/2014   Procedure: ESOPHAGOGASTRODUODENOSCOPY (EGD);  Surgeon: Inda Castle, MD;  Location: [facility];  Service: Endoscopy;  Laterality: N/A;  with dilation  . ESOPHAGOGASTRODUODENOSCOPY (EGD) WITH PROPOFOL N/A 10/09/2014   Procedure: ESOPHAGOGASTRODUODENOSCOPY (EGD) WITH PROPOFOL;  Surgeon: Inda Castle, MD;  Location: [place];  Service: Endoscopy;  Laterality: N/A;  . ESOPHAGOGASTRODUODENOSCOPY (EGD) WITH PROPOFOL N/A 09/19/2016   Procedure: ESOPHAGOGASTRODUODENOSCOPY (EGD) WITH PROPOFOL;  Surgeon: Doran Stabler, MD;  Location: [facility] ENDOSCOPY;  Service: Gastroenterology;  Laterality: N/A;  with savary dil tt  . FRACTURE SURGERY    . ORIF ANKLE FRACTURE Right 07/04/2015   Procedure: OPEN REDUCTION INTERNAL FIXATION (ORIF) ANKLE FRACTURE;  Surgeon: Newt Minion, MD;  Location: [facility];  Service: Orthopedics;  Laterality: Right;  OPEN REDUCTION, INTERNAL FIXATION OF RIGHT ANKLE FRACTURE.   [name] ORIF ANKLE FRACTURE Left 02/10/2016   Procedure: OPEN REDUCTION INTERNAL FIXATION (ORIF) ANKLE FRACTURE;  Surgeon: Newt Minion, MD;  Location: [facility];  Service: Orthopedics;  Laterality: Left;  . SAVORY DILATION N/A 10/09/2014   Procedure: SAVORY DILATION;  Surgeon: Inda Castle, MD;  Location: [facility];  Service: Endoscopy;  Laterality: N/A;  . SAVORY DILATION N/A 09/19/2016   Procedure: SAVORY DILATION;  Surgeon: Doran Stabler, MD;  Location: [facility] ENDOSCOPY;  Service: Gastroenterology;  Laterality: N/A;  . TONSILLECTOMY         History of present illness and  Hospital Course:     Kindly see H&P for history of present illness and admission details, please review complete Labs, Consult reports and Test reports for all details in brief  HPI  from the history and physical done on the day of admission 01/11/2017   Megan [name]  is a 76 y.o. female, With past medical history of lung cancer, diabetes mellitus, psoriatic  arthritis, neuropathy, she presents to as long ED with mechanical fall and facial injury, patient reports she was walking down steps when someone called her name, she turned around, lost her footing and fell to the right side on renal cement, she denies syncope, lightheadedness or dizziness preceding the fall, as well denies any loss of consciousness,, as well she reports right wrist and hand pain, NAD no evidence of fracture and right hand and wrist x-ray, there was trace amount of subarachnoid hemorrhage, questionable nasal fracture, she denies any headache, visual problems, chest pain, shortness of breath, neck pain, fever chills or cough, significant labs abnormalities, hospitalist requested to admit the patient for observation for her subarachnoid hemorrhage.  Hospital Course   Subarachnoid hemorrhage - There is a trace amount of subarachnoid hemorrhage within the right parietal lobe sulcus, very minimal, secondary to mechanical fall, ED discussed with neurosurgery Dr. Cyndy Freeze who recommended admission overnight for observation with neuro checks, patient denies any focal deficits this a.m., ambulatory in the hallway with no assistance, denies any headache dizziness or visual problem, so she will be discharged home today, with instruction to come back to ED if she does develop any new focal deficits or visual symptoms. - There is finding of nasal fracture on imaging, No no tenderness on palpation no swelling or bruising to indicate an acute nasal fracture/patient report she had nasal reconstruction surgery.  Diabetes mellitus - Poorly controlled, will increase home dose Levemir from 20-25, continue with insulin sliding scale, she is being followed by endocrinologist as an outpatient  Fascial laceration - Superficial wounds, No deep cut in the skin to be sutured, continue with local wound care, neomycin ointment 4 times daily , will be discharged on Keflex for next 5 days.  History of stage III  squamous cell cancer - Patient is following with Dr. Inda Merlin, she reports she is scheduled for follow-up with him in couple weeks, - repeat CT chest without contrast was during hospital stay to evaluate focal tenderness in the right Rib cage area for last 2 weeks to rule out metastasis with No definite evidence of recurrent or metastatic disease .  Depression - Continue with Cymbalta  Psoriatic arthritis - Continue with sulfasalazine  Neuropathy - Continue to diabetes and chemotherapy, continue with Lyrica   Discharge Condition:  stable   Follow UP  Follow-up Information    Binnie Rail, MD Follow up in 1 week(s).   Specialty:  Internal Medicine Contact information: [place] [address] [phone]        Eilleen Kempf., MD Follow up.   Specialty:  Oncology Contact information: [id number]  [place] [address] [phone]             Discharge Instructions  and  Discharge Medications     Discharge Instructions    Diet - low sodium heart healthy    Complete by:  As directed    Discharge instructions    Complete by:  As directed    Follow with Primary MD Binnie Rail, MD in 7 days   Get CBC, CMP,  checked  by Primary MD next visit.    Activity: As tolerated with Full fall precautions use walker/cane & assistance as needed   Disposition Home   Diet: Carbohydrate modified   On your next visit with your primary care physician please Get Medicines reviewed and adjusted.   Please request your Prim.MD to go over all Hospital Tests and Procedure/Radiological results at the follow up, please get all Hospital records sent to your Prim MD by signing hospital release before you go home.   If you experience worsening of your admission symptoms, develop shortness of breath, life threatening emergency, suicidal or homicidal thoughts you must seek medical attention immediately by calling 911 or calling your MD immediately   if symptoms less severe.  You Must read complete instructions/literature along with all the possible adverse reactions/side effects for all the Medicines you take and that have been prescribed to you. Take any new Medicines after you have completely understood and accpet all the possible adverse reactions/side effects.   Do not drive, operating heavy machinery, perform activities at heights, swimming or participation in water activities or provide baby sitting services if your were admitted for syncope or siezures until you have seen by Primary MD or a Neurologist and advised to do so again.  Do not drive when taking Pain medications.    Do not take more than prescribed Pain, Sleep and Anxiety Medications  Special Instructions: If you have smoked or chewed Tobacco  in the last 2 yrs please stop smoking, stop any regular Alcohol  and or any Recreational drug use.  Wear Seat belts while driving.   Please note  You were cared for by a hospitalist during your hospital stay. If you have any questions about your discharge medications or the care you received while you were in the hospital after you are discharged, you can call the unit and asked to speak with the hospitalist on call if the hospitalist that took care of you is not available. Once you are discharged, your primary care physician will handle any further medical issues. Please note that NO REFILLS for any discharge medications will be authorized once you are discharged, as it is imperative that you return to your primary care physician (or establish a relationship with a primary care physician if you do not have one) for your aftercare needs so that they can reassess your need for medications and monitor your lab values.   Increase activity slowly    Complete by:  As directed      Allergies as of 01/12/2017      Reactions   Carboplatin Other (See Comments)   Neuropathy   Remicade [infliximab] Anaphylaxis   Hydromorphone Rash        Medication List    STOP taking these medications   LORazepam 0.5 MG tablet Commonly known as:  ATIVAN     TAKE these medications   B-D UF III MINI PEN NEEDLES 31G X 5 MM Misc Generic drug:  Insulin Pen Needle U TO CHECK SUGAR  FID   B-D UF III MINI PEN NEEDLES 31G X 5 MM Misc Generic drug:  Insulin Pen Needle USE TO CHECK SUGAR FIVE TIMES DAILY   cephALEXin 500 MG capsule Commonly known as:  KEFLEX Take 1 capsule (500 mg total) by mouth every 12 (twelve) hours.   DULoxetine 60 MG capsule Commonly known as:  CYMBALTA Take 1 capsule (60 mg total) by mouth 2 (two) times daily.   fluticasone-salmeterol 115-21 MCG/ACT inhaler Commonly known as:  ADVAIR HFA Inhale 2 puffs into the lungs 2 (two) times daily as needed.   Insulin Detemir 100 UNIT/ML Pen Commonly known as:  LEVEMIR FLEXPEN Inject 25 units into the skin at bedtime. What changed:  additional instructions   insulin lispro 100 UNIT/ML KiwkPen Commonly known as:  HUMALOG KWIKPEN INJECT 8-20 units 3x a day under skin What changed:  how much to take  how to take this  when to take this  additional instructions   neomycin-bacitracin-polymyxin [phone] ointment Apply topically 4 (four) times daily. Apply to  wounds at the right face   ONETOUCH DELICA LANCETS FINE Misc USE TO TEST BLOOD SUGAR FIVE TIMES DAILY AS NEEDED   ONETOUCH VERIO test strip Generic drug:  glucose blood Use 5x a day   oxyCODONE-acetaminophen 5-325 MG tablet Commonly known as:  PERCOCET/ROXICET TAKE 1 TABLET BY MOUTH EVERY4-6 HOURS AS NEEDED FOR PAIN.   PAMPRIN MAX PAIN FORMULA PO Take 1 tablet by mouth at bedtime as needed (sleep).   pregabalin 150 MG capsule Commonly known as:  LYRICA Take 1 capsule (150 mg total) by mouth 2 (two) times daily.   saccharomyces boulardii 250 MG capsule Commonly known as:  FLORASTOR Take 1 capsule (250 mg total) by mouth 2 (two) times daily.   solifenacin 5 MG tablet Commonly known as:   VESICARE Take 5 mg by mouth at bedtime.   sulfaSALAzine 500 MG EC tablet Commonly known as:  AZULFIDINE TAKE 2 TABLETS BY MOUTH TWICE DAILY         Diet and Activity recommendation: See Discharge Instructions above   Consults obtained -  ED D/E neurosurgey via phone   Major procedures and Radiology Reports - PLEASE review detailed and final reports for all details, in brief -    Dg Wrist Complete Right  Result Date: 01/11/2017 CLINICAL DATA:  [place] on concrete. Facial laceration and trauma. Wrist pain. EXAM: RIGHT WRIST - COMPLETE 3+ VIEW COMPARISON:  None. FINDINGS: Chronic osteoarthritic changes at the first carpal/metacarpal joint an the distal radioulnar joint. Joint space narrowing, osteophytes and loose bodies. No acute fracture. IMPRESSION: Chronic osteoarthritic changes as above.  No acute finding. Electronically Signed   By: Nelson Chimes M.D.   On: 01/11/2017 15:12   Ct Head Wo Contrast  Addendum Date: 01/11/2017   ADDENDUM REPORT: 01/11/2017 15:26 ADDENDUM: The original report was by Dr. Van Clines. The following addendum is by Dr. Van Clines: Critical Value/emergent results were called by telephone at the time of interpretation on 01/11/2017 at 3:23 pm to [place], PA-C , who verbally acknowledged these results. Electronically Signed   By: Van Clines M.D.   On: 01/11/2017 15:26   Result Date: 01/11/2017 CLINICAL DATA:  Fall down stairs on 2 cm and with gravel. Abrasion along the right face with bruising. History of right upper lobe squamous cell carcinoma. EXAM: CT HEAD WITHOUT CONTRAST CT MAXILLOFACIAL WITHOUT CONTRAST CT CERVICAL SPINE WITHOUT CONTRAST TECHNIQUE: Multidetector CT imaging of the head, cervical spine, and maxillofacial structures were  performed using the standard protocol without intravenous contrast. Multiplanar CT image reconstructions of the cervical spine and maxillofacial structures were also generated. COMPARISON:  Brain MRI dated  12/24/2013 FINDINGS: CT HEAD FINDINGS Brain: The brainstem, cerebellum, cerebral peduncles, thalami, basal ganglia, basilar cisterns, and ventricular system appear within normal limits. No mass lesion or acute CVA. On images 18-21 of series 15 there is density within a right parietal sulcus compatible with a trace amount of subarachnoid hemorrhage. Vascular: There is atherosclerotic calcification of the cavernous carotid arteries bilaterally. Skull: Unremarkable Other: No supplemental non-categorized findings. CT MAXILLOFACIAL FINDINGS Osseous: Degenerative sclerosis along the right temporomandibular joint. There is evidence of bilateral nasal fractures but given the relative lack of surrounding soft tissue swelling, these could be chronic/old. Correlate with bruising and tenderness. No orbital fracture or additional facial fractures identified Orbits: No intraorbital abnormality. Sinuses: Unremarkable Soft tissues: Right periorbital and right facial soft tissue swelling. CT CERVICAL SPINE FINDINGS Alignment: No vertebral subluxation is observed. Skull base and vertebrae: Spurring at the anterior C1- 2 articulation. Reduced intervertebral disc height at all levels between C3 and C7 with mild degenerative endplate findings. No cervical spine fracture is identified. Small left C7 cervical rib. Soft tissues and spinal canal: Unremarkable Disc levels: Uncinate spurring causes foraminal narrowing on the right at C3-4, C4-5, and C5-6 ; and on the left at C4-5 and C5-6, and to a lesser extent at C6-7. Upper chest: Right apical scarring. Other: No supplemental non-categorized findings. IMPRESSION: 1. There is a trace amount of acute subarachnoid hemorrhage within a right parietal lobe sulcus (images 18-21 of series 15). This is a very minimal amount and only well seen on the parasagittal images. 2. Age indeterminate bilateral nasal bone fractures, correlate with any focal tenderness in determining whether these are acute.  3. Right periorbital and right facial soft tissue swelling. 4. Cervical spondylosis and degenerative disc disease causing foraminal impingement at C3-4, C4-5, C5-6, and to a lesser extent at C6-7. Radiology assistant personnel have been notified to put me in telephone contact with the referring physician or the referring physician's clinical representative in order to discuss these findings. Once this communication is established I will issue an addendum to this report for documentation purposes. Electronically Signed: By: Van Clines M.D. On: 01/11/2017 15:20   Ct Chest Wo Contrast  Result Date: 01/12/2017 CLINICAL DATA:  [place] on 01/11/2017, pain under RIGHT breast for 1 month, history of lung cancer post chemotherapy and radiation therapy, hypertension, rheumatoid arthritis, GERD, diabetes mellitus, psoriatic arthritis, former smoker EXAM: CT CHEST WITHOUT CONTRAST TECHNIQUE: Multidetector CT imaging of the chest was performed following the standard protocol without IV contrast. Sagittal and coronal MPR images reconstructed from axial data set. COMPARISON:  06/29/2016, 11/03/2016 FINDINGS: Cardiovascular: Scattered atherosclerotic calcifications aorta and coronary arteries. Aorta normal caliber. Normal heart size. Minimal pericardial fluid. Mediastinum/Nodes: Mediastinal shift to RIGHT secondary to RIGHT hemithorax volume loss. Calcified LEFT hilar nodes. Normal sized RIGHT paratracheal nodes. Abnormal soft tissue at the RIGHT hilum may be related to a combination of atelectasis and vascular structures but with the lack of IV contrast it is difficult to exclude RIGHT hilar adenopathy. No definite thoracic adenopathy. Thickened wall of the midesophagus, question due to radiation. Previously identified LEFT thyroid nodule not well delineated on current study. Lungs/Pleura: Thick bandlike atelectasis in medial RIGHT lung likely RIGHT upper lobe atelectasis unchanged. Additional round atelectasis into the  RIGHT upper lobe posteriorly extending to hilum. Narrowing of central RIGHT bronchi. Observed RIGHT hilar and  perihilar changes likely reflects sequela of radiation therapy though it is difficult to exclude residual underlying tumor in the absence of IV contrast. Mild interseptal thickening in the RIGHT upper lobe. Calcified granuloma LEFT lower lobe image 105. 2 mm LEFT lower lobe nodule image 113 unchanged, an additional granuloma by prior study. No new mass or nodule. No pulmonary infiltrate, pleural effusion or pneumothorax. Ground-glass opacities in RIGHT lower lobe on previous exam resolved. Subsegmental atelectasis at base of lingula. Upper Abdomen: Calcified granulomata within spleen. Post cholecystectomy. Two small probable hepatic cysts unchanged larger 19 mm. Remaining visualized upper abdomen unremarkable. Musculoskeletal: No acute osseous abnormalities. Specifically, no lower anterior RIGHT rib lesions identified. Old fracture anterior RIGHT second rib unchanged. IMPRESSION: Stable RIGHT lung atelectasis and perihilar changes likely reflecting sequela of radiation therapy. Old granulomatous disease. Stable 3 mm LEFT upper lobe sub solid nodule. Resolution of ground-glass infiltrate in RIGHT lower lobe. No definite evidence of recurrent or metastatic disease as above. Aortic atherosclerosis and coronary arterial calcification. Chronic thickening of mid esophagus question related to radiation. Electronically Signed   By: Lavonia Dana M.D.   On: 01/12/2017 10:17   Ct Cervical Spine Wo Contrast  Addendum Date: 01/11/2017   ADDENDUM REPORT: 01/11/2017 15:26 ADDENDUM: The original report was by Dr. Van Clines. The following addendum is by Dr. Van Clines: Critical Value/emergent results were called by telephone at the time of interpretation on 01/11/2017 at 3:23 pm to [place], PA-C , who verbally acknowledged these results. Electronically Signed   By: Van Clines M.D.   On: 01/11/2017  15:26   Result Date: 01/11/2017 CLINICAL DATA:  Fall down stairs on 2 cm and with gravel. Abrasion along the right face with bruising. History of right upper lobe squamous cell carcinoma. EXAM: CT HEAD WITHOUT CONTRAST CT MAXILLOFACIAL WITHOUT CONTRAST CT CERVICAL SPINE WITHOUT CONTRAST TECHNIQUE: Multidetector CT imaging of the head, cervical spine, and maxillofacial structures were performed using the standard protocol without intravenous contrast. Multiplanar CT image reconstructions of the cervical spine and maxillofacial structures were also generated. COMPARISON:  Brain MRI dated 12/24/2013 FINDINGS: CT HEAD FINDINGS Brain: The brainstem, cerebellum, cerebral peduncles, thalami, basal ganglia, basilar cisterns, and ventricular system appear within normal limits. No mass lesion or acute CVA. On images 18-21 of series 15 there is density within a right parietal sulcus compatible with a trace amount of subarachnoid hemorrhage. Vascular: There is atherosclerotic calcification of the cavernous carotid arteries bilaterally. Skull: Unremarkable Other: No supplemental non-categorized findings. CT MAXILLOFACIAL FINDINGS Osseous: Degenerative sclerosis along the right temporomandibular joint. There is evidence of bilateral nasal fractures but given the relative lack of surrounding soft tissue swelling, these could be chronic/old. Correlate with bruising and tenderness. No orbital fracture or additional facial fractures identified Orbits: No intraorbital abnormality. Sinuses: Unremarkable Soft tissues: Right periorbital and right facial soft tissue swelling. CT CERVICAL SPINE FINDINGS Alignment: No vertebral subluxation is observed. Skull base and vertebrae: Spurring at the anterior C1- 2 articulation. Reduced intervertebral disc height at all levels between C3 and C7 with mild degenerative endplate findings. No cervical spine fracture is identified. Small left C7 cervical rib. Soft tissues and spinal canal:  Unremarkable Disc levels: Uncinate spurring causes foraminal narrowing on the right at C3-4, C4-5, and C5-6 ; and on the left at C4-5 and C5-6, and to a lesser extent at C6-7. Upper chest: Right apical scarring. Other: No supplemental non-categorized findings. IMPRESSION: 1. There is a trace amount of acute subarachnoid hemorrhage within a right parietal lobe  sulcus (images 18-21 of series 15). This is a very minimal amount and only well seen on the parasagittal images. 2. Age indeterminate bilateral nasal bone fractures, correlate with any focal tenderness in determining whether these are acute. 3. Right periorbital and right facial soft tissue swelling. 4. Cervical spondylosis and degenerative disc disease causing foraminal impingement at C3-4, C4-5, C5-6, and to a lesser extent at C6-7. Radiology assistant personnel have been notified to put me in telephone contact with the referring physician or the referring physician's clinical representative in order to discuss these findings. Once this communication is established I will issue an addendum to this report for documentation purposes. Electronically Signed: By: Van Clines M.D. On: 01/11/2017 15:20   Dg Hand Complete Right  Result Date: 01/11/2017 CLINICAL DATA:  [place] on concrete.  Pain. EXAM: RIGHT HAND - COMPLETE 3+ VIEW COMPARISON:  None. FINDINGS: No evidence of fracture or dislocation. Chronic osteoarthritis at the first carpometacarpal joint and metacarpal phalangeal joint and affecting the disc radioulnar joint. IMPRESSION: No acute traumatic finding.  Chronic degenerative changes as above. Electronically Signed   By: Nelson Chimes M.D.   On: 01/11/2017 15:13   Xr Foot Complete Right  Result Date: 01/05/2017 Three-view radiographs the right foot shows stable internal fixation across the Lisfranc fracture dislocation. Of note patient has had significant backing out of the Lisfranc screw. There is no rocker bottom deformity.  Ct Maxillofacial  Wo Cm  Addendum Date: 01/11/2017   ADDENDUM REPORT: 01/11/2017 15:26 ADDENDUM: The original report was by Dr. Van Clines. The following addendum is by Dr. Van Clines: Critical Value/emergent results were called by telephone at the time of interpretation on 01/11/2017 at 3:23 pm to [place], PA-C , who verbally acknowledged these results. Electronically Signed   By: Van Clines M.D.   On: 01/11/2017 15:26   Result Date: 01/11/2017 CLINICAL DATA:  Fall down stairs on 2 cm and with gravel. Abrasion along the right face with bruising. History of right upper lobe squamous cell carcinoma. EXAM: CT HEAD WITHOUT CONTRAST CT MAXILLOFACIAL WITHOUT CONTRAST CT CERVICAL SPINE WITHOUT CONTRAST TECHNIQUE: Multidetector CT imaging of the head, cervical spine, and maxillofacial structures were performed using the standard protocol without intravenous contrast. Multiplanar CT image reconstructions of the cervical spine and maxillofacial structures were also generated. COMPARISON:  Brain MRI dated 12/24/2013 FINDINGS: CT HEAD FINDINGS Brain: The brainstem, cerebellum, cerebral peduncles, thalami, basal ganglia, basilar cisterns, and ventricular system appear within normal limits. No mass lesion or acute CVA. On images 18-21 of series 15 there is density within a right parietal sulcus compatible with a trace amount of subarachnoid hemorrhage. Vascular: There is atherosclerotic calcification of the cavernous carotid arteries bilaterally. Skull: Unremarkable Other: No supplemental non-categorized findings. CT MAXILLOFACIAL FINDINGS Osseous: Degenerative sclerosis along the right temporomandibular joint. There is evidence of bilateral nasal fractures but given the relative lack of surrounding soft tissue swelling, these could be chronic/old. Correlate with bruising and tenderness. No orbital fracture or additional facial fractures identified Orbits: No intraorbital abnormality. Sinuses: Unremarkable Soft  tissues: Right periorbital and right facial soft tissue swelling. CT CERVICAL SPINE FINDINGS Alignment: No vertebral subluxation is observed. Skull base and vertebrae: Spurring at the anterior C1- 2 articulation. Reduced intervertebral disc height at all levels between C3 and C7 with mild degenerative endplate findings. No cervical spine fracture is identified. Small left C7 cervical rib. Soft tissues and spinal canal: Unremarkable Disc levels: Uncinate spurring causes foraminal narrowing on the right at C3-4, C4-5, and  C5-6 ; and on the left at C4-5 and C5-6, and to a lesser extent at C6-7. Upper chest: Right apical scarring. Other: No supplemental non-categorized findings. IMPRESSION: 1. There is a trace amount of acute subarachnoid hemorrhage within a right parietal lobe sulcus (images 18-21 of series 15). This is a very minimal amount and only well seen on the parasagittal images. 2. Age indeterminate bilateral nasal bone fractures, correlate with any focal tenderness in determining whether these are acute. 3. Right periorbital and right facial soft tissue swelling. 4. Cervical spondylosis and degenerative disc disease causing foraminal impingement at C3-4, C4-5, C5-6, and to a lesser extent at C6-7. Radiology assistant personnel have been notified to put me in telephone contact with the referring physician or the referring physician's clinical representative in order to discuss these findings. Once this communication is established I will issue an addendum to this report for documentation purposes. Electronically Signed: By: Van Clines M.D. On: 01/11/2017 15:20    Micro Results     No results found for this or any previous visit (from the past 240 hour(s)).     Today   Subjective:   Megan [name] today has no headache,no chest or abdominal pain,no new weakness tingling or numbness, feels much better wants to go home today. Relates in the hallway with no deficits, she only complains  of right facial pain at laceration site.  Objective:   Blood pressure 136/85, pulse 85, temperature 97.8 F (36.6 C), temperature source Oral, resp. rate 16, height 5' 5.5" (1.664 m), weight 75.8 kg (167 lb), SpO2 97 %.   Intake/Output Summary (Last 24 hours) at 01/12/17 1324 Last data filed at 01/12/17 0953  Gross per 24 hour  Intake              240 ml  Output                0 ml  Net              240 ml    Exam Awake Alert, Oriented x 3 Supple Neck,No JVD, Right periorbital facial wounds secondary to pain, or discharge. Symmetrical Chest wall movement, Good air movement bilaterally, CTAB RRR,No Gallops,Rubs or new Murmurs, No Parasternal Heave +ve B.Sounds, Abd Soft, Non tender, No rebound -guarding or rigidity. No Cyanosis, Clubbing , No new Rash or bruise  Data Review   CBC w Diff: Lab Results  Component Value Date   WBC 6.0 01/12/2017   HGB 11.3 (L) 01/12/2017   HGB 13.0 11/03/2016   HCT 35.6 (L) 01/12/2017   HCT 40.4 11/03/2016   PLT 148 (L) 01/12/2017   PLT 225 11/03/2016   LYMPHOPCT 18 01/11/2017   LYMPHOPCT 10.3 (L) 11/03/2016   BANDSPCT 6 02/17/2016   MONOPCT 7 01/11/2017   MONOPCT 7.4 11/03/2016   EOSPCT 5 01/11/2017   EOSPCT 1.7 11/03/2016   BASOPCT 1 01/11/2017   BASOPCT 1.2 11/03/2016    CMP: Lab Results  Component Value Date   NA 137 01/12/2017   NA 143 11/03/2016   K 3.8 01/12/2017   K 3.9 11/03/2016   CL 104 01/12/2017   CO2 28 01/12/2017   CO2 27 11/03/2016   BUN 15 01/12/2017   BUN 11.6 11/03/2016   CREATININE 0.76 01/12/2017   CREATININE 0.7 11/03/2016   GLU 176 03/07/2016   PROT 6.2 (L) 11/03/2016   ALBUMIN 3.4 (L) 11/03/2016   BILITOT 0.31 11/03/2016   ALKPHOS 213 (H) 11/03/2016   AST 18 11/03/2016  ALT 20 11/03/2016  .   Total Time in preparing paper work, data evaluation and todays exam - 35 minutes  Megan [name] M.D on 01/12/2017 at 1:24 PM  [facility]   Office  [phone]

## 2017-01-12 NOTE — Discharge Instructions (Signed)
Follow with Primary MD Binnie Rail, MD in 7 days   Get CBC, CMP,  checked  by Primary MD next visit.    Activity: As tolerated with Full fall precautions use walker/cane & assistance as needed   Disposition Home   Diet: Carbohydrate modified   On your next visit with your primary care physician please Get Medicines reviewed and adjusted.   Please request your Prim.MD to go over all Hospital Tests and Procedure/Radiological results at the follow up, please get all Hospital records sent to your Prim MD by signing hospital release before you go home.   If you experience worsening of your admission symptoms, develop shortness of breath, life threatening emergency, suicidal or homicidal thoughts you must seek medical attention immediately by calling 911 or calling your MD immediately  if symptoms less severe.  You Must read complete instructions/literature along with all the possible adverse reactions/side effects for all the Medicines you take and that have been prescribed to you. Take any new Medicines after you have completely understood and accpet all the possible adverse reactions/side effects.   Do not drive, operating heavy machinery, perform activities at heights, swimming or participation in water activities or provide baby sitting services if your were admitted for syncope or siezures until you have seen by Primary MD or a Neurologist and advised to do so again.  Do not drive when taking Pain medications.    Do not take more than prescribed Pain, Sleep and Anxiety Medications  Special Instructions: If you have smoked or chewed Tobacco  in the last 2 yrs please stop smoking, stop any regular Alcohol  and or any Recreational drug use.  Wear Seat belts while driving.   Please note  You were cared for by a hospitalist during your hospital stay. If you have any questions about your discharge medications or the care you received while you were in the hospital after you are  discharged, you can call the unit and asked to speak with the hospitalist on call if the hospitalist that took care of you is not available. Once you are discharged, your primary care physician will handle any further medical issues. Please note that NO REFILLS for any discharge medications will be authorized once you are discharged, as it is imperative that you return to your primary care physician (or establish a relationship with a primary care physician if you do not have one) for your aftercare needs so that they can reassess your need for medications and monitor your lab values.

## 2017-01-20 ENCOUNTER — Other Ambulatory Visit: Payer: Self-pay | Admitting: Internal Medicine

## 2017-02-02 ENCOUNTER — Encounter (HOSPITAL_COMMUNITY): Payer: Self-pay

## 2017-02-02 ENCOUNTER — Ambulatory Visit (HOSPITAL_COMMUNITY)
Admission: RE | Admit: 2017-02-02 | Discharge: 2017-02-02 | Disposition: A | Discharge status: Home / Self Care | Payer: Medicare Other | Source: Ambulatory Visit | Attending: Internal Medicine | Admitting: Internal Medicine

## 2017-02-02 ENCOUNTER — Other Ambulatory Visit (HOSPITAL_BASED_OUTPATIENT_CLINIC_OR_DEPARTMENT_OTHER): Payer: Medicare Other

## 2017-02-02 DIAGNOSIS — J9811 Atelectasis: Secondary | ICD-10-CM | POA: Diagnosis not present

## 2017-02-02 DIAGNOSIS — C3411 Malignant neoplasm of upper lobe, right bronchus or lung: Secondary | ICD-10-CM | POA: Insufficient documentation

## 2017-02-02 DIAGNOSIS — Z85118 Personal history of other malignant neoplasm of bronchus and lung: Secondary | ICD-10-CM

## 2017-02-02 LAB — CBC WITH DIFFERENTIAL/PLATELET
BASO%: 0.9 % (ref 0.0–2.0)
BASOS ABS: 0.1 10*3/uL (ref 0.0–0.1)
EOS ABS: 0.4 10*3/uL (ref 0.0–0.5)
EOS%: 5.6 % (ref 0.0–7.0)
HCT: 42 % (ref 34.8–46.6)
HGB: 13.5 g/dL (ref 11.6–15.9)
LYMPH%: 16.9 % (ref 14.0–49.7)
MCH: 28.5 pg (ref 25.1–34.0)
MCHC: 32.1 g/dL (ref 31.5–36.0)
MCV: 88.8 fL (ref 79.5–101.0)
MONO#: 0.7 10*3/uL (ref 0.1–0.9)
MONO%: 9.8 % (ref 0.0–14.0)
NEUT#: 4.4 10*3/uL (ref 1.5–6.5)
NEUT%: 66.8 % (ref 38.4–76.8)
Platelets: 174 10*3/uL (ref 145–400)
RBC: 4.73 10*6/uL (ref 3.70–5.45)
RDW: 16.7 % — ABNORMAL HIGH (ref 11.2–14.5)
WBC: 6.6 10*3/uL (ref 3.9–10.3)
lymph#: 1.1 10*3/uL (ref 0.9–3.3)

## 2017-02-02 LAB — COMPREHENSIVE METABOLIC PANEL
ALT: 28 U/L (ref 0–55)
AST: 29 U/L (ref 5–34)
Albumin: 3.8 g/dL (ref 3.5–5.0)
Alkaline Phosphatase: 203 U/L — ABNORMAL HIGH (ref 40–150)
Anion Gap: 9 mEq/L (ref 3–11)
BUN: 17.4 mg/dL (ref 7.0–26.0)
CO2: 29 meq/L (ref 22–29)
Calcium: 9.9 mg/dL (ref 8.4–10.4)
Chloride: 103 mEq/L (ref 98–109)
Creatinine: 0.9 mg/dL (ref 0.6–1.1)
EGFR: 66 mL/min/{1.73_m2} — AB (ref >90)
GLUCOSE: 114 mg/dL (ref 70–140)
POTASSIUM: 4.1 meq/L (ref 3.5–5.1)
SODIUM: 140 meq/L (ref 136–145)
Total Bilirubin: 0.5 mg/dL (ref 0.20–1.20)
Total Protein: 6.9 g/dL (ref 6.4–8.3)

## 2017-02-02 MED ORDER — IOPAMIDOL (ISOVUE-300) INJECTION 61%
INTRAVENOUS | Status: AC
  Filled 2017-02-02: qty 75

## 2017-02-02 MED ORDER — IOPAMIDOL (ISOVUE-300) INJECTION 61%
75.0000 mL | Freq: Once | INTRAVENOUS | Status: AC | PRN
  Administered 2017-02-02: 75 mL via INTRAVENOUS

## 2017-02-03 ENCOUNTER — Other Ambulatory Visit: Payer: Self-pay | Admitting: Internal Medicine

## 2017-02-07 ENCOUNTER — Other Ambulatory Visit: Payer: Self-pay | Admitting: Emergency Medicine

## 2017-02-07 ENCOUNTER — Encounter: Payer: Self-pay | Admitting: Internal Medicine

## 2017-02-07 ENCOUNTER — Ambulatory Visit (INDEPENDENT_AMBULATORY_CARE_PROVIDER_SITE_OTHER): Payer: Medicare Other | Admitting: Internal Medicine

## 2017-02-07 VITALS — BP 152/100 | HR 109 | Temp 98.1°F | Resp 16 | Ht 66.0 in | Wt 176.0 lb

## 2017-02-07 DIAGNOSIS — E1140 Type 2 diabetes mellitus with diabetic neuropathy, unspecified: Secondary | ICD-10-CM

## 2017-02-07 DIAGNOSIS — Z794 Long term (current) use of insulin: Secondary | ICD-10-CM | POA: Diagnosis not present

## 2017-02-07 DIAGNOSIS — I609 Nontraumatic subarachnoid hemorrhage, unspecified: Secondary | ICD-10-CM | POA: Diagnosis not present

## 2017-02-07 DIAGNOSIS — R2689 Other abnormalities of gait and mobility: Secondary | ICD-10-CM | POA: Diagnosis not present

## 2017-02-07 DIAGNOSIS — S0181XA Laceration without foreign body of other part of head, initial encounter: Secondary | ICD-10-CM | POA: Insufficient documentation

## 2017-02-07 DIAGNOSIS — I10 Essential (primary) hypertension: Secondary | ICD-10-CM

## 2017-02-07 DIAGNOSIS — S0181XD Laceration without foreign body of other part of head, subsequent encounter: Secondary | ICD-10-CM | POA: Diagnosis not present

## 2017-02-07 MED ORDER — AMLODIPINE BESYLATE 2.5 MG PO TABS: 2.5000 mg | ORAL_TABLET | Freq: Every day | ORAL | 3 refills | Status: DC

## 2017-02-07 NOTE — Assessment & Plan Note (Signed)
Lab Results  Component Value Date   HGBA1C 8.7 11/11/2016   Insulin increased in hospital  Has scheduled follow up with Dr Cruzita Lederer Will work on increasing walking Decrease sugar intake

## 2017-02-07 NOTE — Progress Notes (Signed)
Pre visit review using our clinic review tool, if applicable. No additional management support is needed unless otherwise documented below in the visit note. 

## 2017-02-07 NOTE — Patient Instructions (Addendum)
  Medications reviewed and updated.  Changes include starting amlodipine 2.5 mg daily.   Monitor your BP at home - it should be less than 140/90.   Your prescription(s) have been submitted to your pharmacy. Please take as directed and contact our office if you believe you are having problem(s) with the medication(s).  A referral was ordered for physical therapy  Please followup in 6 months

## 2017-02-07 NOTE — Assessment & Plan Note (Signed)
Off medication - it was stopped a while ago Has been elevated Start amlodipine 2.5 mg daily Monitor bp at home - may need to adjust medication

## 2017-02-07 NOTE — Assessment & Plan Note (Signed)
Very small No concerning neurological deficits/symptoms Balance is poor - at risk for future falls - referred to PT, uses cane/rollator to ambulate

## 2017-02-07 NOTE — Assessment & Plan Note (Signed)
Related to neuropathy, deconditioning, chronic foot issues Using cane, rollator Will refer to PT Will try increasing her walking with rollator

## 2017-02-07 NOTE — Assessment & Plan Note (Signed)
Healed Can continue to apply ointment, but healed

## 2017-02-07 NOTE — Progress Notes (Signed)
Subjective:    Patient ID: Megan [name], female    DOB: June 20, 1941, 76 y.o.   MRN: [id number]  HPI She is here for follow up from the hospital.     She was admitted 01/11/17 - 01/12/17 after a fall.    She had a mechanical fall and facial injury.  She reported she lost her footing when someone called her and she turned around and fell to the right side on cement stairs and into a bush.  There was no pre-syncopal symptoms and she denied LOC.  She had a small subarachnoid hemorrhage, questionable nasal fracture, wrist and hand pain without fracture.  She denied headache, vision changes, chest pain, SOB at that time.  She was observed overnight and had frequent neuro checks.  Neurosurgery was consulted.  She was discharged home the next day.  She has not felt steady since leaving the hospital.  She uses a cane to walk and at home the rollator.  She is interested in doing balance exercises.  She wants to walk more.  She has neuropathy from chemo and she knows that affects her balance.  She is taking lyrica and it helps, but she thinks it is causing a slight tremor.    Diabetes:  Sugars were not controlled in the hospital.  Her levemir was increased to 25, she is on SS insulin.  She follows with Dr Cruzita Lederer and has a follow up. she is not compliant with a low sugar diet.  She is currently not exercising regularly.     Facial laceration: superficial.  No sutures needed.  Local wound care only.  She is concerned about developing scar tissue and may want to see a Psychiatric nurse.   Hypertension: She has a cuff at home, but does not check it at home.  She is not currently on any medication.  She was on medication in the past, but it was controlled off medication.  She has gained weight and she thinks that has cause her BP to go up.     Medications and allergies reviewed with patient and updated if appropriate.  Patient Active Problem List   Diagnosis Date Noted  . Facial laceration 02/07/2017    . Poor balance 02/07/2017  . Diabetes mellitus ([place]) 01/11/2017  . Subarachnoid bleed ([place]) 01/11/2017  . Painful orthopaedic hardware ([place]) 01/05/2017  . Psoriatic arthropathy ([place]) 09/08/2016  . Psoriatic arthritis ([place]) 09/08/2016  . Hypertension 09/08/2016  . Diabetic neuropathy ([place]) 09/08/2016  . 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 Syncope 03/25/2016  . Malnutrition of moderate degree 02/29/2016  . Skin breakdown 02/26/2016  . Leukocytosis 02/17/2016  . Diabetic ketoacidosis with coma associated with diabetes mellitus due to underlying condition ([place])   . Abnormal finding on GI tract imaging   . Hematochezia   . Acute kidney injury ([place]) 02/04/2016  . RLS (restless legs syndrome) 12/11/2015  . Diarrhea due to drug 08/11/2015  . Liver dysfunction 08/11/2015  . Lumbago 05/06/2015  . Hot flashes 04/14/2015  . Peripheral edema 04/14/2015  . Rheumatoid arthritis ([place]) 04/14/2015  . Encounter for antineoplastic immunotherapy 04/06/2015  . Emphysema of lung ([place]) 11/26/2014  . Radiation-induced esophageal stricture 10/01/2014  . Tachycardia 08/27/2014  . Depression 08/27/2014  . Palliative care encounter 08/21/2014  . DNR (do not resuscitate) discussion 08/21/2014  . Neuropathic pain 08/21/2014  . Cancer associated pain 07/29/2014  . Malignant cachexia ([place]) 07/29/2014  . Lung collapse 07/29/2014  .  Fatigue 07/18/2014  . Neuropathy due to chemotherapeutic drug ([place]) 07/18/2014  . Leg cramps 07/18/2014  . Hypoalbuminemia 07/18/2014  . Thrombocytopenia, unspecified 07/18/2014  . Dyspnea 04/28/2014  . Radiation esophagitis 04/10/2014  . Right-sided chest wall pain 04/01/2014  . Cancer of middle lobe of lung ([place]) 01/17/2014  . Malignant neoplasm of right upper lobe of lung ([place]) 11/28/2013  . Lung mass 11/09/2013    Current Outpatient Prescriptions on File Prior to Visit  Medication Sig Dispense Refill  .  APAP-Pamabrom-Pyrilamine (PAMPRIN MAX PAIN FORMULA PO) Take 1 tablet by mouth at bedtime as needed (sleep).    . B-D UF III MINI PEN NEEDLES 31G X 5 MM MISC U TO CHECK SUGAR FID  3  . B-D UF III MINI PEN NEEDLES 31G X 5 MM MISC USE TO CHECK SUGAR FIVE TIMES DAILY 100 each 0  . DULoxetine (CYMBALTA) 60 MG capsule Take 1 capsule (60 mg total) by mouth 2 (two) times daily. 60 capsule 12  . fluticasone-salmeterol (ADVAIR HFA) 115-21 MCG/ACT inhaler Inhale 2 puffs into the lungs 2 (two) times daily as needed.     . Insulin Detemir (LEVEMIR FLEXPEN) 100 UNIT/ML Pen Inject 25 units into the skin at bedtime. 10 pen 2  . insulin lispro (HUMALOG KWIKPEN) 100 UNIT/ML KiwkPen INJECT 8-20 units 3x a day under skin (Patient taking differently: Inject 8-20 Units into the skin 4 (four) times daily. Per patient sliding scale.) 30 mL 5  . neomycin-bacitracin-polymyxin (NEOSPORIN) [phone] ointment Apply topically 4 (four) times daily. Apply to  wounds at the right face 28 g 0  . ONETOUCH DELICA LANCETS FINE MISC USE TO TEST BLOOD SUGAR FIVE TIMES DAILY AS NEEDED 200 each 11  . ONETOUCH VERIO test strip Use 5x a day 300 each 11  . oxyCODONE-acetaminophen (PERCOCET/ROXICET) 5-325 MG tablet TAKE 1 TABLET BY MOUTH EVERY4-6 HOURS AS NEEDED FOR PAIN.  0  . pregabalin (LYRICA) 150 MG capsule Take 1 capsule (150 mg total) by mouth 2 (two) times daily. 60 capsule 5  . saccharomyces boulardii (FLORASTOR) 250 MG capsule Take 1 capsule (250 mg total) by mouth 2 (two) times daily. 30 capsule 0  . solifenacin (VESICARE) 5 MG tablet Take 5 mg by mouth at bedtime.     . sulfaSALAzine (AZULFIDINE) 500 MG EC tablet TAKE 2 TABLETS BY MOUTH TWICE DAILY 120 tablet 2   No current facility-administered medications on file prior to visit.     Past Medical History:  Diagnosis Date  . Cancer ([place]) dx'd 09/2013   Lung ca  . Diabetes ([place]) 09/08/2016   type I diabetic patient reported on 09/13/16   . Diabetes mellitus    insulin  .  Diabetic neuropathy ([place]) 09/08/2016  . GERD (gastroesophageal reflux disease)    no meds for  . History of hiatal hernia   . Hx of radiation therapy 12/16/13-01/30/14   lung 66Gy  . Hypertension   . Malignant neoplasm of right upper lobe of lung ([place]) 11/28/2013  . Neuropathy ([place])   . Psoriasis   . Psoriatic arthritis ([place]) 09/08/2016  . Rheumatoid arthritis ([place])   . Shortness of breath dyspnea    with exertion    Past Surgical History:  Procedure Laterality Date  . ABDOMINAL HYSTERECTOMY    . ABDOMINAL SURGERY    . BALLOON DILATION N/A 12/12/2014   Procedure: BALLOON DILATION;  Surgeon: Inda Castle, MD;  Location: [facility];  Service: Endoscopy;  Laterality: N/A;  . CHOLECYSTECTOMY    .  COLONOSCOPY N/A 02/07/2016   Procedure: COLONOSCOPY;  Surgeon: Doran Stabler, MD;  Location: [facility];  Service: Endoscopy;  Laterality: N/A;  . DILATION AND CURETTAGE OF UTERUS    . ESOPHAGOGASTRODUODENOSCOPY N/A 12/12/2014   Procedure: ESOPHAGOGASTRODUODENOSCOPY (EGD);  Surgeon: Inda Castle, MD;  Location: [facility];  Service: Endoscopy;  Laterality: N/A;  with dilation  . ESOPHAGOGASTRODUODENOSCOPY (EGD) WITH PROPOFOL N/A 10/09/2014   Procedure: ESOPHAGOGASTRODUODENOSCOPY (EGD) WITH PROPOFOL;  Surgeon: Inda Castle, MD;  Location: [facility];  Service: Endoscopy;  Laterality: N/A;  . ESOPHAGOGASTRODUODENOSCOPY (EGD) WITH PROPOFOL N/A 09/19/2016   Procedure: ESOPHAGOGASTRODUODENOSCOPY (EGD) WITH PROPOFOL;  Surgeon: Doran Stabler, MD;  Location: [facility];  Service: Gastroenterology;  Laterality: N/A;  with savary dil tt  . FRACTURE SURGERY    . ORIF ANKLE FRACTURE Right 07/04/2015   Procedure: OPEN REDUCTION INTERNAL FIXATION (ORIF) ANKLE FRACTURE;  Surgeon: Newt Minion, MD;  Location: [facility];  Service: Orthopedics;  Laterality: Right;  OPEN REDUCTION, INTERNAL FIXATION OF RIGHT ANKLE FRACTURE.   [name] ORIF ANKLE FRACTURE Left 02/10/2016   Procedure: OPEN REDUCTION INTERNAL  FIXATION (ORIF) ANKLE FRACTURE;  Surgeon: Newt Minion, MD;  Location: [facility];  Service: Orthopedics;  Laterality: Left;  . SAVORY DILATION N/A 10/09/2014   Procedure: SAVORY DILATION;  Surgeon: Inda Castle, MD;  Location: [facility];  Service: Endoscopy;  Laterality: N/A;  . SAVORY DILATION N/A 09/19/2016   Procedure: SAVORY DILATION;  Surgeon: Doran Stabler, MD;  Location: [facility];  Service: Gastroenterology;  Laterality: N/A;  . TONSILLECTOMY      Social History   Social History  . Marital status: Divorced    Spouse name: N/A  . Number of children: 0  . Years of education: MA   Occupational History  . Retired Other   Social History Main Topics  . Smoking status: Former Smoker    Packs/day: 3.00    Years: 45.00    Types: Cigarettes    Quit date: 12/05/1997  . Smokeless tobacco: Never Used  . Alcohol use No  . Drug use: No  . Sexual activity: Not Currently   Other Topics Concern  . Not on file   Social History Narrative   Patient lives at home alone.   Caffeine Use: 2 cups daily    Family History  Problem Relation Age of Onset  . Other Mother   . Other Father     failure to thrive  . Hypertension Other   . Stroke Other   . Heart attack Other   . Hemachromatosis Other   . Rheum arthritis      both sets of grandparents and father    Review of Systems  Constitutional: Negative for fever.  Eyes: Positive for visual disturbance (blurry vision - not related to recent fall).  Respiratory: Positive for cough, shortness of breath (mild) and wheezing (with inhalation).   Cardiovascular: Positive for leg swelling (mild). Negative for chest pain and palpitations.  Neurological: Negative for dizziness, light-headedness and headaches.       Objective:   Vitals:   02/07/17 1513  BP: (!) 152/100  Pulse: (!) 109  Resp: 16  Temp: 98.1 F (36.7 C)   Filed Weights   02/07/17 1513  Weight: 176 lb (79.8 kg)   Body mass index is 28.41 kg/m.  Wt  Readings from Last 3 Encounters:  02/07/17 176 lb (79.8 kg)  01/11/17 167 lb (75.8 kg)  12/12/16 169 lb 2 oz (76.7  kg)     Physical Exam Constitutional: Appears well-developed and well-nourished. No distress.  HENT:  Head: Normocephalic and atraumatic.  Neck: Neck supple. No tracheal deviation present. No thyromegaly present.  No cervical lymphadenopathy Cardiovascular: Normal rate, regular rhythm and normal heart sounds.   2/6 systolic murmur heard. No carotid bruit .  No edema Pulmonary/Chest: Effort normal and breath sounds normal. No respiratory distress. No has no wheezes. No rales.  Neuro: CN 2-12 intact, unsteady balance - using a cane Skin: Skin is warm and dry. Not diaphoretic. healed scarring around right orbit Psychiatric: Normal mood and affect. Behavior is normal.       Assessment & Plan:   See Problem List for Assessment and Plan of chronic medical problems.

## 2017-02-09 ENCOUNTER — Telehealth: Payer: Self-pay | Admitting: Internal Medicine

## 2017-02-09 ENCOUNTER — Encounter: Payer: Self-pay | Admitting: Internal Medicine

## 2017-02-09 ENCOUNTER — Ambulatory Visit (HOSPITAL_BASED_OUTPATIENT_CLINIC_OR_DEPARTMENT_OTHER): Payer: Medicare Other | Admitting: Internal Medicine

## 2017-02-09 VITALS — BP 123/74 | HR 116 | Temp 97.5°F | Resp 17 | Ht 66.0 in | Wt 176.2 lb

## 2017-02-09 DIAGNOSIS — Z85118 Personal history of other malignant neoplasm of bronchus and lung: Secondary | ICD-10-CM

## 2017-02-09 DIAGNOSIS — R000 Tachycardia, unspecified: Secondary | ICD-10-CM

## 2017-02-09 DIAGNOSIS — C3411 Malignant neoplasm of upper lobe, right bronchus or lung: Secondary | ICD-10-CM

## 2017-02-09 NOTE — Telephone Encounter (Signed)
Appointments scheduled per 02/09/17 los. Patient was given a copy of the AVS report and appointment schedule, per 02/09/17 los.

## 2017-02-09 NOTE — Progress Notes (Signed)
[facility] [phone]   [phone]  OFFICE PROGRESS NOTE  Binnie Rail, MD [place] [address]  DIAGNOSIS: Squamous cell lung cancer  Primary site: Lung (Right)  Staging method: AJCC 7th Edition  Clinical: Stage IIIB (T3, N3, M0)  Summary: Stage IIIB (T3, N3, M0)   PRIOR THERAPY:  1) Concurrent chemoradiation with weekly chemotherapy in the form of carboplatin for AUC of 2 and paclitaxel 45 mg/m2. Status post 7 week of treatment. Last dose was given 01/27/2014 no significant response in her disease.  2) Consolidation chemotherapy with carboplatin for AUC of 5 and paclitaxel 175 mg/M2 every 3 weeks with Neulasta support. First dose on 04/15/2014. Status post 3 cycles. Carboplatin was discontinued starting cycle #2 secondary to hypersensitivity reaction. 3) Immunotherapy with Opdivo (Nivolumab) '3mg'$ /kg given every 2 weeks. Status post 33 cycles discontinued secondary to intolerance.  CURRENT THERAPY: Observation.  CHEMOTHERAPY INTENT: Control/Palliative  CURRENT # OF CHEMOTHERAPY CYCLES: 0 CURRENT ANTIEMETICS: Zofran, dexamethasone, Compazine  CURRENT SMOKING STATUS: Former smoker, quit 12/05/1997  ORAL CHEMOTHERAPY AND CONSENT: n/a  CURRENT BISPHOSPHONATES USE: None  PAIN MANAGEMENT: no cancer related pain, patient does have RA  NARCOTICS INDUCED CONSTIPATION: none  LIVING WILL AND CODE STATUS: ?  INTERVAL HISTORY: WILLEAN [name] 76 y.o. female returns to the clinic today for follow-up visit. The patient is feeling fine with no specific complaints. She had a fall few weeks ago. She denied having any chest pain, shortness breath, cough or hemoptysis. She denied having any weight loss or night sweats. She has no nausea, vomiting, diarrhea or constipation. She has no fever or chills. She had repeat CT scan of the chest performed recently and she is here for evaluation and discussion of her scan  results.  MEDICAL HISTORY: Past Medical History:  Diagnosis Date  . Cancer ([place]) dx'd 09/2013   Lung ca  . Diabetes ([place]) 09/08/2016   type I diabetic patient reported on 09/13/16   . Diabetes mellitus    insulin  . Diabetic neuropathy ([place]) 09/08/2016  . GERD (gastroesophageal reflux disease)    no meds for  . History of hiatal hernia   . Hx of radiation therapy 12/16/13-01/30/14   lung 66Gy  . Hypertension   . Malignant neoplasm of right upper lobe of lung ([place]) 11/28/2013  . Neuropathy ([place])   . Psoriasis   . Psoriatic arthritis ([place]) 09/08/2016  . Rheumatoid arthritis ([place])   . Shortness of breath dyspnea    with exertion    ALLERGIES:  is allergic to carboplatin; remicade [infliximab]; and hydromorphone.  MEDICATIONS:  Current Outpatient Prescriptions  Medication Sig Dispense Refill  . amLODipine (NORVASC) 2.5 MG tablet Take 1 tablet (2.5 mg total) by mouth daily. 90 tablet 3  . APAP-Pamabrom-Pyrilamine (PAMPRIN MAX PAIN FORMULA PO) Take 1 tablet by mouth at bedtime as needed (sleep).    . B-D UF III MINI PEN NEEDLES 31G X 5 MM MISC U TO CHECK SUGAR FID  3  . B-D UF III MINI PEN NEEDLES 31G X 5 MM MISC USE TO CHECK SUGAR FIVE TIMES DAILY 100 each 0  . DULoxetine (CYMBALTA) 60 MG capsule Take 1 capsule (60 mg total) by mouth 2 (two) times daily. 60 capsule 12  . fluticasone-salmeterol (ADVAIR HFA) 115-21 MCG/ACT inhaler Inhale 2 puffs into the lungs 2 (two) times daily as needed.     . Insulin Detemir (LEVEMIR FLEXPEN) 100 UNIT/ML Pen Inject 25 units into  the skin at bedtime. 10 pen 2  . insulin lispro (HUMALOG KWIKPEN) 100 UNIT/ML KiwkPen INJECT 8-20 units 3x a day under skin (Patient taking differently: Inject 8-20 Units into the skin 4 (four) times daily. Per patient sliding scale.) 30 mL 5  . neomycin-bacitracin-polymyxin (NEOSPORIN) [phone] ointment Apply topically 4 (four) times daily. Apply to  wounds at the right face 28 g 0  . ONETOUCH DELICA LANCETS FINE MISC USE TO  TEST BLOOD SUGAR FIVE TIMES DAILY AS NEEDED 200 each 11  . ONETOUCH VERIO test strip Use 5x a day 300 each 11  . oxyCODONE-acetaminophen (PERCOCET/ROXICET) 5-325 MG tablet TAKE 1 TABLET BY MOUTH EVERY4-6 HOURS AS NEEDED FOR PAIN.  0  . pregabalin (LYRICA) 150 MG capsule Take 1 capsule (150 mg total) by mouth 2 (two) times daily. 60 capsule 5  . saccharomyces boulardii (FLORASTOR) 250 MG capsule Take 1 capsule (250 mg total) by mouth 2 (two) times daily. 30 capsule 0  . solifenacin (VESICARE) 5 MG tablet Take 5 mg by mouth at bedtime.     . sulfaSALAzine (AZULFIDINE) 500 MG EC tablet TAKE 2 TABLETS BY MOUTH TWICE DAILY 120 tablet 2   No current facility-administered medications for this visit.     SURGICAL HISTORY:  Past Surgical History:  Procedure Laterality Date  . ABDOMINAL HYSTERECTOMY    . ABDOMINAL SURGERY    . BALLOON DILATION N/A 12/12/2014   Procedure: BALLOON DILATION;  Surgeon: Inda Castle, MD;  Location: [facility];  Service: Endoscopy;  Laterality: N/A;  . CHOLECYSTECTOMY    . COLONOSCOPY N/A 02/07/2016   Procedure: COLONOSCOPY;  Surgeon: Doran Stabler, MD;  Location: [facility] ENDOSCOPY;  Service: Endoscopy;  Laterality: N/A;  . DILATION AND CURETTAGE OF UTERUS    . ESOPHAGOGASTRODUODENOSCOPY N/A 12/12/2014   Procedure: ESOPHAGOGASTRODUODENOSCOPY (EGD);  Surgeon: Inda Castle, MD;  Location: [facility];  Service: Endoscopy;  Laterality: N/A;  with dilation  . ESOPHAGOGASTRODUODENOSCOPY (EGD) WITH PROPOFOL N/A 10/09/2014   Procedure: ESOPHAGOGASTRODUODENOSCOPY (EGD) WITH PROPOFOL;  Surgeon: Inda Castle, MD;  Location: [facility];  Service: Endoscopy;  Laterality: N/A;  . ESOPHAGOGASTRODUODENOSCOPY (EGD) WITH PROPOFOL N/A 09/19/2016   Procedure: ESOPHAGOGASTRODUODENOSCOPY (EGD) WITH PROPOFOL;  Surgeon: Doran Stabler, MD;  Location: [facility];  Service: Gastroenterology;  Laterality: N/A;  with savary dil tt  . FRACTURE SURGERY    . ORIF ANKLE FRACTURE Right  07/04/2015   Procedure: OPEN REDUCTION INTERNAL FIXATION (ORIF) ANKLE FRACTURE;  Surgeon: Newt Minion, MD;  Location: [facility];  Service: Orthopedics;  Laterality: Right;  OPEN REDUCTION, INTERNAL FIXATION OF RIGHT ANKLE FRACTURE.   [name] ORIF ANKLE FRACTURE Left 02/10/2016   Procedure: OPEN REDUCTION INTERNAL FIXATION (ORIF) ANKLE FRACTURE;  Surgeon: Newt Minion, MD;  Location: [facility];  Service: Orthopedics;  Laterality: Left;  . SAVORY DILATION N/A 10/09/2014   Procedure: SAVORY DILATION;  Surgeon: Inda Castle, MD;  Location: [facility];  Service: Endoscopy;  Laterality: N/A;  . SAVORY DILATION N/A 09/19/2016   Procedure: SAVORY DILATION;  Surgeon: Doran Stabler, MD;  Location: [facility];  Service: Gastroenterology;  Laterality: N/A;  . TONSILLECTOMY      REVIEW OF SYSTEMS:  A comprehensive review of systems was negative except for: Constitutional: positive for fatigue Musculoskeletal: positive for arthralgias   PHYSICAL EXAMINATION: General appearance: alert, cooperative, fatigued and no distress Head: Normocephalic, without obvious abnormality, atraumatic Neck: no adenopathy, no JVD, supple, symmetrical, trachea midline and thyroid not enlarged, symmetric, no  tenderness/mass/nodules Lymph nodes: Cervical, supraclavicular, and axillary nodes normal. Resp: clear to auscultation bilaterally Back: symmetric, no curvature. ROM normal. No CVA tenderness. Cardio: regular rate and rhythm, S1, S2 normal, no murmur, click, rub or gallop GI: soft, non-tender; bowel sounds normal; no masses,  no organomegaly Extremities: extremities normal, atraumatic, no cyanosis or edema    ECOG PERFORMANCE STATUS: 1 - Symptomatic but completely ambulatory  Blood pressure 123/74, pulse (!) 116, temperature 97.5 F (36.4 C), temperature source Oral, resp. rate 17, height '5\' 6"'$  (1.676 m), weight 176 lb 3.2 oz (79.9 kg), SpO2 96 %.  LABORATORY DATA: Lab Results  Component Value Date   WBC 6.6 02/02/2017    HGB 13.5 02/02/2017   HCT 42.0 02/02/2017   MCV 88.8 02/02/2017   PLT 174 02/02/2017      Chemistry      Component Value Date/Time   NA 140 02/02/2017 1040   K 4.1 02/02/2017 1040   CL 104 01/12/2017 0427   CO2 29 02/02/2017 1040   BUN 17.4 02/02/2017 1040   CREATININE 0.9 02/02/2017 1040   GLU 176 03/07/2016      Component Value Date/Time   CALCIUM 9.9 02/02/2017 1040   ALKPHOS 203 (H) 02/02/2017 1040   AST 29 02/02/2017 1040   ALT 28 02/02/2017 1040   BILITOT 0.50 02/02/2017 1040       RADIOGRAPHIC STUDIES: Dg Wrist Complete Right  Result Date: 01/11/2017 CLINICAL DATA:  [place] on concrete. Facial laceration and trauma. Wrist pain. EXAM: RIGHT WRIST - COMPLETE 3+ VIEW COMPARISON:  None. FINDINGS: Chronic osteoarthritic changes at the first carpal/metacarpal joint an the distal radioulnar joint. Joint space narrowing, osteophytes and loose bodies. No acute fracture. IMPRESSION: Chronic osteoarthritic changes as above.  No acute finding. Electronically Signed   By: Nelson Chimes M.D.   On: 01/11/2017 15:12   Ct Head Wo Contrast  Addendum Date: 01/11/2017   ADDENDUM REPORT: 01/11/2017 15:26 ADDENDUM: The original report was by Dr. Van Clines. The following addendum is by Dr. Van Clines: Critical Value/emergent results were called by telephone at the time of interpretation on 01/11/2017 at 3:23 pm to [place], PA-C , who verbally acknowledged these results. Electronically Signed   By: Van Clines M.D.   On: 01/11/2017 15:26   Result Date: 01/11/2017 CLINICAL DATA:  Fall down stairs on 2 cm and with gravel. Abrasion along the right face with bruising. History of right upper lobe squamous cell carcinoma. EXAM: CT HEAD WITHOUT CONTRAST CT MAXILLOFACIAL WITHOUT CONTRAST CT CERVICAL SPINE WITHOUT CONTRAST TECHNIQUE: Multidetector CT imaging of the head, cervical spine, and maxillofacial structures were performed using the standard protocol without intravenous  contrast. Multiplanar CT image reconstructions of the cervical spine and maxillofacial structures were also generated. COMPARISON:  Brain MRI dated 12/24/2013 FINDINGS: CT HEAD FINDINGS Brain: The brainstem, cerebellum, cerebral peduncles, thalami, basal ganglia, basilar cisterns, and ventricular system appear within normal limits. No mass lesion or acute CVA. On images 18-21 of series 15 there is density within a right parietal sulcus compatible with a trace amount of subarachnoid hemorrhage. Vascular: There is atherosclerotic calcification of the cavernous carotid arteries bilaterally. Skull: Unremarkable Other: No supplemental non-categorized findings. CT MAXILLOFACIAL FINDINGS Osseous: Degenerative sclerosis along the right temporomandibular joint. There is evidence of bilateral nasal fractures but given the relative lack of surrounding soft tissue swelling, these could be chronic/old. Correlate with bruising and tenderness. No orbital fracture or additional facial fractures identified Orbits: No intraorbital abnormality. Sinuses: Unremarkable Soft tissues: Right  periorbital and right facial soft tissue swelling. CT CERVICAL SPINE FINDINGS Alignment: No vertebral subluxation is observed. Skull base and vertebrae: Spurring at the anterior C1- 2 articulation. Reduced intervertebral disc height at all levels between C3 and C7 with mild degenerative endplate findings. No cervical spine fracture is identified. Small left C7 cervical rib. Soft tissues and spinal canal: Unremarkable Disc levels: Uncinate spurring causes foraminal narrowing on the right at C3-4, C4-5, and C5-6 ; and on the left at C4-5 and C5-6, and to a lesser extent at C6-7. Upper chest: Right apical scarring. Other: No supplemental non-categorized findings. IMPRESSION: 1. There is a trace amount of acute subarachnoid hemorrhage within a right parietal lobe sulcus (images 18-21 of series 15). This is a very minimal amount and only well seen on the  parasagittal images. 2. Age indeterminate bilateral nasal bone fractures, correlate with any focal tenderness in determining whether these are acute. 3. Right periorbital and right facial soft tissue swelling. 4. Cervical spondylosis and degenerative disc disease causing foraminal impingement at C3-4, C4-5, C5-6, and to a lesser extent at C6-7. Radiology assistant personnel have been notified to put me in telephone contact with the referring physician or the referring physician's clinical representative in order to discuss these findings. Once this communication is established I will issue an addendum to this report for documentation purposes. Electronically Signed: By: Van Clines M.D. On: 01/11/2017 15:20   Ct Chest Wo Contrast  Result Date: 01/12/2017 CLINICAL DATA:  [place] on 01/11/2017, pain under RIGHT breast for 1 month, history of lung cancer post chemotherapy and radiation therapy, hypertension, rheumatoid arthritis, GERD, diabetes mellitus, psoriatic arthritis, former smoker EXAM: CT CHEST WITHOUT CONTRAST TECHNIQUE: Multidetector CT imaging of the chest was performed following the standard protocol without IV contrast. Sagittal and coronal MPR images reconstructed from axial data set. COMPARISON:  06/29/2016, 11/03/2016 FINDINGS: Cardiovascular: Scattered atherosclerotic calcifications aorta and coronary arteries. Aorta normal caliber. Normal heart size. Minimal pericardial fluid. Mediastinum/Nodes: Mediastinal shift to RIGHT secondary to RIGHT hemithorax volume loss. Calcified LEFT hilar nodes. Normal sized RIGHT paratracheal nodes. Abnormal soft tissue at the RIGHT hilum may be related to a combination of atelectasis and vascular structures but with the lack of IV contrast it is difficult to exclude RIGHT hilar adenopathy. No definite thoracic adenopathy. Thickened wall of the midesophagus, question due to radiation. Previously identified LEFT thyroid nodule not well delineated on current  study. Lungs/Pleura: Thick bandlike atelectasis in medial RIGHT lung likely RIGHT upper lobe atelectasis unchanged. Additional round atelectasis into the RIGHT upper lobe posteriorly extending to hilum. Narrowing of central RIGHT bronchi. Observed RIGHT hilar and perihilar changes likely reflects sequela of radiation therapy though it is difficult to exclude residual underlying tumor in the absence of IV contrast. Mild interseptal thickening in the RIGHT upper lobe. Calcified granuloma LEFT lower lobe image 105. 2 mm LEFT lower lobe nodule image 113 unchanged, an additional granuloma by prior study. No new mass or nodule. No pulmonary infiltrate, pleural effusion or pneumothorax. Ground-glass opacities in RIGHT lower lobe on previous exam resolved. Subsegmental atelectasis at base of lingula. Upper Abdomen: Calcified granulomata within spleen. Post cholecystectomy. Two small probable hepatic cysts unchanged larger 19 mm. Remaining visualized upper abdomen unremarkable. Musculoskeletal: No acute osseous abnormalities. Specifically, no lower anterior RIGHT rib lesions identified. Old fracture anterior RIGHT second rib unchanged. IMPRESSION: Stable RIGHT lung atelectasis and perihilar changes likely reflecting sequela of radiation therapy. Old granulomatous disease. Stable 3 mm LEFT upper lobe sub solid nodule. Resolution of  ground-glass infiltrate in RIGHT lower lobe. No definite evidence of recurrent or metastatic disease as above. Aortic atherosclerosis and coronary arterial calcification. Chronic thickening of mid esophagus question related to radiation. Electronically Signed   By: Lavonia Dana M.D.   On: 01/12/2017 10:17   Ct Chest W Contrast  Result Date: 02/02/2017 CLINICAL DATA:  Restaging lung cancer. Initial diagnosis 2015. Status post chemotherapy and radiation therapy. Chronic cough. EXAM: CT CHEST WITH CONTRAST TECHNIQUE: Multidetector CT imaging of the chest was performed during intravenous contrast  administration. CONTRAST:  43m ISOVUE-300 IOPAMIDOL (ISOVUE-300) INJECTION 61% COMPARISON:  Multiple prior chest CTs. The most recent are from January and March of 2018 FINDINGS: Chest wall: No breast masses, supraclavicular or axillary lymphadenopathy. Small scattered lymph nodes are stable. The thyroid gland is normal. Cardiovascular: The heart is normal in size. No pericardial effusion. Stable tortuosity, ectasia and calcification of the thoracic aorta. No dissection. The branch vessels are patent. Stable coronary artery calcifications. Mediastinum/Nodes: No mediastinal or hilar mass for lymphadenopathy. Stable soft tissue density surrounding the right mainstem bronchus and bronchus intermedius. Stable marked narrowing of the right upper lobe bronchus with chronic right upper lobe atelectasis. Stable surrounding right infrahilar density likely chronic radiation fibrosis and atelectasis. No CT findings suspicious for recurrent tumor. There is marked thickening of the mid esophageal wall likely due to radiation effect. Lungs/Pleura: Stable small calcified granuloma in the left lower lobe. There are few tiny scattered pulmonary nodules which are stable. No new pulmonary lesions or acute pulmonary findings. No pleural effusion. Upper Abdomen: No significant upper abdominal findings. Stable hepatic cysts. No findings worrisome for hepatic or adrenal gland metastasis. Stable left adrenal gland adenoma. Musculoskeletal: No significant bony findings. No evidence of osseous metastatic disease. IMPRESSION: 1. Stable CT appearance of the chest. Stable changes of radiation involving the right paramediastinal lung and esophagus. Chronic right upper lobe atelectasis. 2. A few stable scattered tiny pulmonary nodules but no findings suspicious for metastatic disease. 3. No mediastinal or hilar mass or adenopathy. Electronically Signed   By: PMarijo SanesM.D.   On: 02/02/2017 15:10   Ct Cervical Spine Wo Contrast  Addendum  Date: 01/11/2017   ADDENDUM REPORT: 01/11/2017 15:26 ADDENDUM: The original report was by Dr. WVan Clines The following addendum is by Dr. WVan Clines Critical Value/emergent results were called by telephone at the time of interpretation on 01/11/2017 at 3:23 pm to [place] PA-C , who verbally acknowledged these results. Electronically Signed   By: WVan ClinesM.D.   On: 01/11/2017 15:26   Result Date: 01/11/2017 CLINICAL DATA:  Fall down stairs on 2 cm and with gravel. Abrasion along the right face with bruising. History of right upper lobe squamous cell carcinoma. EXAM: CT HEAD WITHOUT CONTRAST CT MAXILLOFACIAL WITHOUT CONTRAST CT CERVICAL SPINE WITHOUT CONTRAST TECHNIQUE: Multidetector CT imaging of the head, cervical spine, and maxillofacial structures were performed using the standard protocol without intravenous contrast. Multiplanar CT image reconstructions of the cervical spine and maxillofacial structures were also generated. COMPARISON:  Brain MRI dated 12/24/2013 FINDINGS: CT HEAD FINDINGS Brain: The brainstem, cerebellum, cerebral peduncles, thalami, basal ganglia, basilar cisterns, and ventricular system appear within normal limits. No mass lesion or acute CVA. On images 18-21 of series 15 there is density within a right parietal sulcus compatible with a trace amount of subarachnoid hemorrhage. Vascular: There is atherosclerotic calcification of the cavernous carotid arteries bilaterally. Skull: Unremarkable Other: No supplemental non-categorized findings. CT MAXILLOFACIAL FINDINGS Osseous: Degenerative sclerosis along the right  temporomandibular joint. There is evidence of bilateral nasal fractures but given the relative lack of surrounding soft tissue swelling, these could be chronic/old. Correlate with bruising and tenderness. No orbital fracture or additional facial fractures identified Orbits: No intraorbital abnormality. Sinuses: Unremarkable Soft tissues: Right  periorbital and right facial soft tissue swelling. CT CERVICAL SPINE FINDINGS Alignment: No vertebral subluxation is observed. Skull base and vertebrae: Spurring at the anterior C1- 2 articulation. Reduced intervertebral disc height at all levels between C3 and C7 with mild degenerative endplate findings. No cervical spine fracture is identified. Small left C7 cervical rib. Soft tissues and spinal canal: Unremarkable Disc levels: Uncinate spurring causes foraminal narrowing on the right at C3-4, C4-5, and C5-6 ; and on the left at C4-5 and C5-6, and to a lesser extent at C6-7. Upper chest: Right apical scarring. Other: No supplemental non-categorized findings. IMPRESSION: 1. There is a trace amount of acute subarachnoid hemorrhage within a right parietal lobe sulcus (images 18-21 of series 15). This is a very minimal amount and only well seen on the parasagittal images. 2. Age indeterminate bilateral nasal bone fractures, correlate with any focal tenderness in determining whether these are acute. 3. Right periorbital and right facial soft tissue swelling. 4. Cervical spondylosis and degenerative disc disease causing foraminal impingement at C3-4, C4-5, C5-6, and to a lesser extent at C6-7. Radiology assistant personnel have been notified to put me in telephone contact with the referring physician or the referring physician's clinical representative in order to discuss these findings. Once this communication is established I will issue an addendum to this report for documentation purposes. Electronically Signed: By: Van Clines M.D. On: 01/11/2017 15:20   Dg Hand Complete Right  Result Date: 01/11/2017 CLINICAL DATA:  [place] on concrete.  Pain. EXAM: RIGHT HAND - COMPLETE 3+ VIEW COMPARISON:  None. FINDINGS: No evidence of fracture or dislocation. Chronic osteoarthritis at the first carpometacarpal joint and metacarpal phalangeal joint and affecting the disc radioulnar joint. IMPRESSION: No acute traumatic  finding.  Chronic degenerative changes as above. Electronically Signed   By: Nelson Chimes M.D.   On: 01/11/2017 15:13   Ct Maxillofacial Wo Cm  Addendum Date: 01/11/2017   ADDENDUM REPORT: 01/11/2017 15:26 ADDENDUM: The original report was by Dr. Van Clines. The following addendum is by Dr. Van Clines: Critical Value/emergent results were called by telephone at the time of interpretation on 01/11/2017 at 3:23 pm to [place], PA-C , who verbally acknowledged these results. Electronically Signed   By: Van Clines M.D.   On: 01/11/2017 15:26   Result Date: 01/11/2017 CLINICAL DATA:  Fall down stairs on 2 cm and with gravel. Abrasion along the right face with bruising. History of right upper lobe squamous cell carcinoma. EXAM: CT HEAD WITHOUT CONTRAST CT MAXILLOFACIAL WITHOUT CONTRAST CT CERVICAL SPINE WITHOUT CONTRAST TECHNIQUE: Multidetector CT imaging of the head, cervical spine, and maxillofacial structures were performed using the standard protocol without intravenous contrast. Multiplanar CT image reconstructions of the cervical spine and maxillofacial structures were also generated. COMPARISON:  Brain MRI dated 12/24/2013 FINDINGS: CT HEAD FINDINGS Brain: The brainstem, cerebellum, cerebral peduncles, thalami, basal ganglia, basilar cisterns, and ventricular system appear within normal limits. No mass lesion or acute CVA. On images 18-21 of series 15 there is density within a right parietal sulcus compatible with a trace amount of subarachnoid hemorrhage. Vascular: There is atherosclerotic calcification of the cavernous carotid arteries bilaterally. Skull: Unremarkable Other: No supplemental non-categorized findings. CT MAXILLOFACIAL FINDINGS Osseous: Degenerative sclerosis along  the right temporomandibular joint. There is evidence of bilateral nasal fractures but given the relative lack of surrounding soft tissue swelling, these could be chronic/old. Correlate with bruising and  tenderness. No orbital fracture or additional facial fractures identified Orbits: No intraorbital abnormality. Sinuses: Unremarkable Soft tissues: Right periorbital and right facial soft tissue swelling. CT CERVICAL SPINE FINDINGS Alignment: No vertebral subluxation is observed. Skull base and vertebrae: Spurring at the anterior C1- 2 articulation. Reduced intervertebral disc height at all levels between C3 and C7 with mild degenerative endplate findings. No cervical spine fracture is identified. Small left C7 cervical rib. Soft tissues and spinal canal: Unremarkable Disc levels: Uncinate spurring causes foraminal narrowing on the right at C3-4, C4-5, and C5-6 ; and on the left at C4-5 and C5-6, and to a lesser extent at C6-7. Upper chest: Right apical scarring. Other: No supplemental non-categorized findings. IMPRESSION: 1. There is a trace amount of acute subarachnoid hemorrhage within a right parietal lobe sulcus (images 18-21 of series 15). This is a very minimal amount and only well seen on the parasagittal images. 2. Age indeterminate bilateral nasal bone fractures, correlate with any focal tenderness in determining whether these are acute. 3. Right periorbital and right facial soft tissue swelling. 4. Cervical spondylosis and degenerative disc disease causing foraminal impingement at C3-4, C4-5, C5-6, and to a lesser extent at C6-7. Radiology assistant personnel have been notified to put me in telephone contact with the referring physician or the referring physician's clinical representative in order to discuss these findings. Once this communication is established I will issue an addendum to this report for documentation purposes. Electronically Signed: By: Van Clines M.D. On: 01/11/2017 15:20    ASSESSMENT AND PLAN:  This is a very pleasant 76 years old white female with metastatic non-small cell lung cancer status post a course of concurrent chemoradiation as well as consolidation chemotherapy  discontinued secondary to intolerance. 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 She has been observation for more than 15 months and the patient has been doing fine with no specific complaints. Her recent CT scan of the chest showed no evidence for disease progression. I discussed the scan results with the patient today and recommended for her to continue on observation with repeat CT scan of the chest in 3 months. For the tachycardia, she will continue her current medication as prescribed by his primary care physician and I encouraged the patient to increase her oral intake. She was advised to call immediately if she has any concerning symptoms in the interval. The patient voices understanding of current disease status and treatment options and is in agreement with the current care plan. All questions were answered. The patient knows to call the clinic with any problems, questions or concerns. We can certainly see the patient much sooner if necessary. I spent 10 minutes counseling the patient face to face. The total time spent in the appointment was 15 minutes.  Disclaimer: This note was dictated with voice recognition software. Similar sounding words can inadvertently be transcribed and may not be corrected upon review.

## 2017-02-10 ENCOUNTER — Encounter: Payer: Medicare Other | Admitting: Dietician

## 2017-02-10 ENCOUNTER — Ambulatory Visit: Payer: Medicare Other | Admitting: Internal Medicine

## 2017-02-14 ENCOUNTER — Encounter (INDEPENDENT_AMBULATORY_CARE_PROVIDER_SITE_OTHER): Payer: Self-pay | Admitting: Orthopedic Surgery

## 2017-02-14 ENCOUNTER — Ambulatory Visit (INDEPENDENT_AMBULATORY_CARE_PROVIDER_SITE_OTHER): Payer: Medicare Other | Admitting: Orthopedic Surgery

## 2017-02-14 VITALS — Ht 66.0 in | Wt 176.0 lb

## 2017-02-14 DIAGNOSIS — T8484XA Pain due to internal orthopedic prosthetic devices, implants and grafts, initial encounter: Secondary | ICD-10-CM

## 2017-02-15 ENCOUNTER — Encounter: Payer: Self-pay | Admitting: Internal Medicine

## 2017-02-15 ENCOUNTER — Ambulatory Visit (INDEPENDENT_AMBULATORY_CARE_PROVIDER_SITE_OTHER): Payer: Medicare Other | Admitting: Internal Medicine

## 2017-02-15 VITALS — BP 146/82 | Temp 98.6°F | Resp 14 | Wt 183.6 lb

## 2017-02-15 DIAGNOSIS — E139 Other specified diabetes mellitus without complications: Secondary | ICD-10-CM

## 2017-02-15 DIAGNOSIS — E109 Type 1 diabetes mellitus without complications: Secondary | ICD-10-CM | POA: Diagnosis not present

## 2017-02-15 LAB — POCT GLYCOSYLATED HEMOGLOBIN (HGB A1C): HEMOGLOBIN A1C: 7.9

## 2017-02-15 NOTE — Progress Notes (Signed)
Patient ID: Megan [name], female   DOB: 05/28/1941, 76 y.o.   MRN: [id number]  HPI: Megan [name] is a 76 y.o.-year-old female, returning for f/u for DM2, dx in 2013, and as LADA in 04/2016, insulin-dependent since 2014, uncontrolled, with complications (DKA, PN). Last visit 3 mo ago.  She fell in 12/2016 had a small subarachnoid hemorrhage. No fractures.  Reviewed hx: She has a complicated medical history, with a diagnosis of ulcerative colitis with significant diarrhea that caused dehydration in the past and episodes of DKA. She had 2 admissions for this in 02/26/2016 and 04/14/2016. While in rehabilitation, her CBGs dropped to 30s on her previous dose of insulin, therefore, her long-acting insulin was decreased.   She also has a history of lung cancer diagnosed 09/2013. She had chemotherapy and radiation therapy for this, now immunotherapy. She has malignant cachexia >> gained weight since last visit.  Last hemoglobin A1c was: Lab Results  Component Value Date   HGBA1C 8.7 11/11/2016   HGBA1C 8.3 08/12/2016   HGBA1C 10.5 05/04/2016  12/30/2015: 12% 09/18/2015: 11.3% 06/17/2015: 7.5% 04/08/2015: 10.9% 09/10/2014: 8.5%  11/04/2013: 6.2%  At last visit, we adjusted to: - Levemir 10 units in am and 20 units at bedtime. - Humalog mealtime dose: Previously insulin to carb ratio 1:7 >> could not do the calculations >> using 8-23 units per meal. Now on: - Not following the above regimen! She takes 8 units for each meal and uses another SSI  - ? (has this at home). She also takes 8 units at bedtime + the SSI.   Pt checks her sugars 3x a day and they are still very variable per her great log: - am: 68, 71-271, 360 >> 87-291 >> 89-269, 318, 533 >> 93-281, 313 >> 84-393 - 2h after b'fast: n/c >> 171-528 >> n/c - before lunch: n/c >> 113-260 >> 111, 162-470 >> 114, 122, 187-453 (snack before lunch) >> 159-464 - 2h after lunch: n/c >> 134-439 >> n/c - before dinner: n/c >>  99-600 >> 70, 96-447 >> 121-464 >> 98-576 - 2h after dinner: n/c >> 128-479 >> n/c  - bedtime: n/c >> 306-594 >> 145-383 >> 84, 166-573 >> 113-447 - nighttime: n/c >> 57 x1 No lows. Lowest sugar was 30 >> 68 >> 70 >> 57 >> 84; she has hypoglycemia awareness at 50-60.  Highest sugar was 533 >> 573 >> 464.  Pt's meals are: - Breakfast: Cereal or oatmeal with blueberries or eggs with sausage - Lunch: Sandwich and soup - Dinner: Meat, vegetables, small amount of starch - Snacks: 3-4 a day: Peanuts  - no CKD, last BUN/creatinine:  Lab Results  Component Value Date   BUN 17.4 02/02/2017   BUN 15 01/12/2017   CREATININE 0.9 02/02/2017   CREATININE 0.76 01/12/2017   - last set of lipids: 04/04/2013: 141/145/41/71 Lab Results  Component Value Date   CHOL 201 (H) 11/14/2016   HDL 57.40 11/14/2016   LDLCALC 128 (H) 11/14/2016   TRIG 79.0 11/14/2016   CHOLHDL 4 11/14/2016   - last eye exam was in 08/2016. No DR. Dr. Lady Gary. - + numbness and tingling in her feet. PN from [facility].  ROS: Constitutional: + weight gain, + fatigue, + hot flushes, + nocturia Eyes: no blurry vision, no xerophthalmia ENT: no sore throat, no nodules palpated in throat, no dysphagia/odynophagia,+ hoarseness Cardiovascular: no CP/+ SOB/no palpitations/++ leg swelling Respiratory: +cough/+ SOB Gastrointestinal: no N/V/D/C Musculoskeletal: no muscle/+ joint aches (RA) Skin: no rashes Neurological: no  tremors/numbness/tingling/no dizziness  I reviewed pt's medications, allergies, PMH, social hx, family hx, and changes were documented in the history of present illness. Otherwise, unchanged from my initial visit note.  Past Medical History:  Diagnosis Date  . Cancer ([place]) dx'd 09/2013   Lung ca  . Diabetes ([place]) 09/08/2016   type I diabetic patient reported on 09/13/16   . Diabetes mellitus    insulin  . Diabetic neuropathy ([place]) 09/08/2016  . GERD (gastroesophageal reflux disease)    no meds for  .  History of hiatal hernia   . Hx of radiation therapy 12/16/13-01/30/14   lung 66Gy  . Hypertension   . Malignant neoplasm of right upper lobe of lung ([place]) 11/28/2013  . Neuropathy   . Psoriasis   . Psoriatic arthritis ([place]) 09/08/2016  . Rheumatoid arthritis ([place])   . Shortness of breath dyspnea    with exertion   Past Surgical History:  Procedure Laterality Date  . ABDOMINAL HYSTERECTOMY    . ABDOMINAL SURGERY    . BALLOON DILATION N/A 12/12/2014   Procedure: BALLOON DILATION;  Surgeon: Inda Castle, MD;  Location: [facility];  Service: Endoscopy;  Laterality: N/A;  . CHOLECYSTECTOMY    . COLONOSCOPY N/A 02/07/2016   Procedure: COLONOSCOPY;  Surgeon: Doran Stabler, MD;  Location: [facility];  Service: Endoscopy;  Laterality: N/A;  . DILATION AND CURETTAGE OF UTERUS    . ESOPHAGOGASTRODUODENOSCOPY N/A 12/12/2014   Procedure: ESOPHAGOGASTRODUODENOSCOPY (EGD);  Surgeon: Inda Castle, MD;  Location: [facility];  Service: Endoscopy;  Laterality: N/A;  with dilation  . ESOPHAGOGASTRODUODENOSCOPY (EGD) WITH PROPOFOL N/A 10/09/2014   Procedure: ESOPHAGOGASTRODUODENOSCOPY (EGD) WITH PROPOFOL;  Surgeon: Inda Castle, MD;  Location: [place];  Service: Endoscopy;  Laterality: N/A;  . ESOPHAGOGASTRODUODENOSCOPY (EGD) WITH PROPOFOL N/A 09/19/2016   Procedure: ESOPHAGOGASTRODUODENOSCOPY (EGD) WITH PROPOFOL;  Surgeon: Doran Stabler, MD;  Location: [facility];  Service: Gastroenterology;  Laterality: N/A;  with savary dil tt  . FRACTURE SURGERY    . ORIF ANKLE FRACTURE Right 07/04/2015   Procedure: OPEN REDUCTION INTERNAL FIXATION (ORIF) ANKLE FRACTURE;  Surgeon: Newt Minion, MD;  Location: [place];  Service: Orthopedics;  Laterality: Right;  OPEN REDUCTION, INTERNAL FIXATION OF RIGHT ANKLE FRACTURE.   [name] ORIF ANKLE FRACTURE Left 02/10/2016   Procedure: OPEN REDUCTION INTERNAL FIXATION (ORIF) ANKLE FRACTURE;  Surgeon: Newt Minion, MD;  Location: [place];  Service: Orthopedics;   Laterality: Left;  . SAVORY DILATION N/A 10/09/2014   Procedure: SAVORY DILATION;  Surgeon: Inda Castle, MD;  Location: [place];  Service: Endoscopy;  Laterality: N/A;  . SAVORY DILATION N/A 09/19/2016   Procedure: SAVORY DILATION;  Surgeon: Doran Stabler, MD;  Location: [facility];  Service: Gastroenterology;  Laterality: N/A;  . TONSILLECTOMY     Social History   Social History  . Marital Status: Divorced    Spouse Name: N/A  . Number of Children: 0   Occupational History  . Retired Pharmacist, hospital Other   Social History Main Topics  . Smoking status: Former Smoker -- 3.00 packs/day for 45 years    Types: Cigarettes    Quit date: 12/05/1997  . Smokeless tobacco: Never Used  . Alcohol Use: No  . Drug Use: No   Social History Narrative   Patient lives at home alone.   Caffeine Use: 2 cups daily   Current Outpatient Prescriptions on File Prior to Visit  Medication Sig Dispense Refill  . amLODipine (NORVASC) 2.5  MG tablet Take 1 tablet (2.5 mg total) by mouth daily. 90 tablet 3  . APAP-Pamabrom-Pyrilamine (PAMPRIN MAX PAIN FORMULA PO) Take 1 tablet by mouth at bedtime as needed (sleep).    . B-D UF III MINI PEN NEEDLES 31G X 5 MM MISC U TO CHECK SUGAR FID  3  . B-D UF III MINI PEN NEEDLES 31G X 5 MM MISC USE TO CHECK SUGAR FIVE TIMES DAILY 100 each 0  . DULoxetine (CYMBALTA) 60 MG capsule Take 1 capsule (60 mg total) by mouth 2 (two) times daily. 60 capsule 12  . fluticasone-salmeterol (ADVAIR HFA) 115-21 MCG/ACT inhaler Inhale 2 puffs into the lungs 2 (two) times daily as needed.     . Insulin Detemir (LEVEMIR FLEXPEN) 100 UNIT/ML Pen Inject 25 units into the skin at bedtime. 10 pen 2  . insulin lispro (HUMALOG KWIKPEN) 100 UNIT/ML KiwkPen INJECT 8-20 units 3x a day under skin (Patient taking differently: Inject 8-20 Units into the skin 4 (four) times daily. Per patient sliding scale.) 30 mL 5  . neomycin-bacitracin-polymyxin (NEOSPORIN) [phone] ointment Apply  topically 4 (four) times daily. Apply to  wounds at the right face 28 g 0  . ONETOUCH DELICA LANCETS FINE MISC USE TO TEST BLOOD SUGAR FIVE TIMES DAILY AS NEEDED 200 each 11  . ONETOUCH VERIO test strip Use 5x a day 300 each 11  . oxyCODONE-acetaminophen (PERCOCET/ROXICET) 5-325 MG tablet TAKE 1 TABLET BY MOUTH EVERY4-6 HOURS AS NEEDED FOR PAIN.  0  . pregabalin (LYRICA) 150 MG capsule Take 1 capsule (150 mg total) by mouth 2 (two) times daily. 60 capsule 5  . saccharomyces boulardii (FLORASTOR) 250 MG capsule Take 1 capsule (250 mg total) by mouth 2 (two) times daily. 30 capsule 0  . solifenacin (VESICARE) 5 MG tablet Take 5 mg by mouth at bedtime.     . sulfaSALAzine (AZULFIDINE) 500 MG EC tablet TAKE 2 TABLETS BY MOUTH TWICE DAILY 120 tablet 2   No current facility-administered medications on file prior to visit.    Allergies  Allergen Reactions  . Carboplatin Other (See Comments)    Neuropathy  . Remicade [Infliximab] Anaphylaxis  . Hydromorphone Rash   Family History  Problem Relation Age of Onset  . Other Mother   . Other Father     failure to thrive  . Hypertension Other   . Stroke Other   . Heart attack Other   . Hemachromatosis Other   . Rheum arthritis      both sets of grandparents and father   PE: BP (!) 146/82 (BP Location: Left Arm, Patient Position: Sitting, Cuff Size: Normal)   Temp 98.6 F (37 C) (Oral)   Resp 14   Wt 183 lb 9.6 oz (83.3 kg)   BMI 29.63 kg/m  Wt Readings from Last 3 Encounters:  02/15/17 183 lb 9.6 oz (83.3 kg)  02/14/17 176 lb (79.8 kg)  02/09/17 176 lb 3.2 oz (79.9 kg)   Constitutional: Thin, in NAD Eyes: PERRLA, EOMI, no exophthalmos ENT: moist mucous membranes, no thyromegaly, no cervical lymphadenopathy Cardiovascular: RRR, No MRG, + bilateral. Peri-ankle edema (pitting) Respiratory: CTA B Gastrointestinal: abdomen soft, NT, ND, BS+ Musculoskeletal: no deformities, strength intact in all 4 Skin: moist, warm, + bruise o L side of  face, near eye Neurological: no tremor with outstretched hands, DTR normal in all 4  ASSESSMENT: 1. LADA, insulin-dependent, uncontrolled, with complications - DKA in the setting of dehydration - PN  Pulmonologist: Dr. Chase Caller Oncologist: Dr. Earlie Server  Cardiologist: Dr. Sallyanne Kuster Ophthalmologist: Dr. Clifton James  PLAN:  1. Patient with long-standing, uncontrolled LADA, on basal-bolus insulin regimen, with variable sugars. Sugars are still very fluctuating. She is not using the rapid acting insulin that I gave her last time. She is basically chasing her high CBGs rather than prevent them. We discussed how to adjust her regimen to improve the CBG variability. She agrees to try this. - I suggested to:  Patient Instructions  Please increase: - Levemir to 15 units in am and 25 at bedtime  Please change: - Humalog mealtime dose  - small meal: 12 units  - regular meal: 18 units  - larger meal: 24 units  Please do not skip the insulin if sugars are 70-100.   If the sugars are <70, take 1/2 of the Humalog dose.  - Humalog Sliding scale: 150-200: + 2 unit 201-250: + 4 units 251-300: + 6 units 301-350: + 8 units >350: + 9 units    Please let me know if the sugars are consistently <80 or >200.  Please return in 3 months with your sugar log.   - HbA1c today: 7.9% (better!) - continue checking sugars at different times of the day - check 3 times a day, rotating checks - advised for yearly eye exams >> she is UTD - Return to clinic in 3 mo with sugar log   Philemon Kingdom, MD PhD [facility]

## 2017-02-15 NOTE — Patient Instructions (Addendum)
Please increase: - Levemir to 15 units in am and 25 at bedtime  Please change: - Humalog mealtime dose  - small meal: 12 units  - regular meal: 18 units  - larger meal: 24 units  Please do not skip the insulin if sugars are 70-100.   If the sugars are <70, take 1/2 of the Humalog dose.  - Humalog Sliding scale: 150-200: + 2 unit 201-250: + 4 units 251-300: + 6 units 301-350: + 8 units >350: + 9 units    Please let me know if the sugars are consistently <80 or >200.  Please return in 3 months with your sugar log.

## 2017-02-20 NOTE — Progress Notes (Signed)
Office Visit Note   Patient: Megan [name]           Date of Birth: October 29, 1941           MRN: [id number] Visit Date: 02/14/2017              Requested by: Binnie Rail, MD [place], [place] [id number] PCP: Binnie Rail, MD  Chief Complaint  Patient presents with  . Right Foot - Routine Post Op    2 week s/p lisfranc joint screw removal      HPI: Patient presents for follow-up status post removal of deep retained hardware.  Assessment & Plan: Visit Diagnoses:  1. Painful orthopaedic hardware [facility])     Plan: Follow-up in 3 weeks. Increase her weightbearing as tolerated.  Follow-Up Instructions: Return in about 3 weeks (around 03/07/2017).   Ortho Exam  Patient is alert, oriented, no adenopathy, well-dressed, normal affect, normal respiratory effort. Examination there is no redness no cellulitis no signs of infection.  Imaging: No results found.  Labs: Lab Results  Component Value Date   HGBA1C 7.9 02/15/2017   HGBA1C 8.7 11/11/2016   HGBA1C 8.3 08/12/2016   ESRSEDRATE 50 (H) 07/22/2014   REPTSTATUS 02/28/2016 FINAL 02/26/2016   CULT 7,000 COLONIES/mL INSIGNIFICANT GROWTH (A) 02/26/2016    Orders:  No orders of the defined types were placed in this encounter.  No orders of the defined types were placed in this encounter.    Procedures: No procedures performed  Clinical Data: No additional findings.  ROS:  All other systems negative, except as noted in the HPI. Review of Systems  Objective: Vital Signs: Ht '5\' 6"'$  (1.676 m)   Wt 176 lb (79.8 kg)   BMI 28.41 kg/m   Specialty Comments:  No specialty comments available.  PMFS History: Patient Active Problem List   Diagnosis Date Noted  . Facial laceration 02/07/2017  . Poor balance 02/07/2017  . Diabetes mellitus ([place]) 01/11/2017  . Subarachnoid bleed ([place]) 01/11/2017  . Painful orthopaedic hardware ([place]) 01/05/2017  . Psoriatic arthropathy ([place]) 09/08/2016  . Psoriatic  arthritis ([place]) 09/08/2016  . Hypertension 09/08/2016  . Diabetic neuropathy ([place]) 09/08/2016  . LADA (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), managed as type 1 ([place]) 05/04/2016  . Syncope 03/25/2016  . Malnutrition of moderate degree 02/29/2016  . Leukocytosis 02/17/2016  . Diabetic ketoacidosis with coma associated with diabetes mellitus due to underlying condition ([place])   . Abnormal finding on GI tract imaging   . Hematochezia   . RLS (restless legs syndrome) 12/11/2015  . Liver dysfunction 08/11/2015  . Lumbago 05/06/2015  . Hot flashes 04/14/2015  . Peripheral edema 04/14/2015  . Rheumatoid arthritis ([place]) 04/14/2015  . Encounter for antineoplastic immunotherapy 04/06/2015  . Emphysema of lung ([place]) 11/26/2014  . Radiation-induced esophageal stricture 10/01/2014  . Tachycardia 08/27/2014  . Depression 08/27/2014  . Palliative care encounter 08/21/2014  . DNR (do not resuscitate) discussion 08/21/2014  . Neuropathic pain 08/21/2014  . Cancer associated pain 07/29/2014  . Malignant cachexia ([place]) 07/29/2014  . Lung collapse 07/29/2014  . Fatigue 07/18/2014  . Neuropathy due to chemotherapeutic drug ([place]) 07/18/2014  . Leg cramps 07/18/2014  . Hypoalbuminemia 07/18/2014  . Thrombocytopenia, unspecified ([place]) 07/18/2014  . Dyspnea 04/28/2014  . Radiation esophagitis 04/10/2014  . Right-sided chest wall pain 04/01/2014  . Cancer of middle lobe of lung ([place]) 01/17/2014  . Malignant neoplasm of right upper lobe of lung ([place]) 11/28/2013  . Lung mass  11/09/2013   Past Medical History:  Diagnosis Date  . Cancer ([place]) dx'd 09/2013   Lung ca  . Diabetes ([place]) 09/08/2016   type I diabetic patient reported on 09/13/16   . Diabetes mellitus    insulin  . Diabetic neuropathy ([place]) 09/08/2016  . GERD (gastroesophageal reflux disease)    no meds for  . History of hiatal hernia   . Hx of radiation therapy 12/16/13-01/30/14   lung 66Gy  . Hypertension   . Malignant neoplasm of right  upper lobe of lung ([place]) 11/28/2013  . Neuropathy   . Psoriasis   . Psoriatic arthritis ([place]) 09/08/2016  . Rheumatoid arthritis ([place])   . Shortness of breath dyspnea    with exertion    Family History  Problem Relation Age of Onset  . Other Mother   . Other Father     failure to thrive  . Hypertension Other   . Stroke Other   . Heart attack Other   . Hemachromatosis Other   . Rheum arthritis      both sets of grandparents and father    Past Surgical History:  Procedure Laterality Date  . ABDOMINAL HYSTERECTOMY    . ABDOMINAL SURGERY    . BALLOON DILATION N/A 12/12/2014   Procedure: BALLOON DILATION;  Surgeon: Inda Castle, MD;  Location: [facility];  Service: Endoscopy;  Laterality: N/A;  . CHOLECYSTECTOMY    . COLONOSCOPY N/A 02/07/2016   Procedure: COLONOSCOPY;  Surgeon: Doran Stabler, MD;  Location: [facility];  Service: Endoscopy;  Laterality: N/A;  . DILATION AND CURETTAGE OF UTERUS    . ESOPHAGOGASTRODUODENOSCOPY N/A 12/12/2014   Procedure: ESOPHAGOGASTRODUODENOSCOPY (EGD);  Surgeon: Inda Castle, MD;  Location: [facility];  Service: Endoscopy;  Laterality: N/A;  with dilation  . ESOPHAGOGASTRODUODENOSCOPY (EGD) WITH PROPOFOL N/A 10/09/2014   Procedure: ESOPHAGOGASTRODUODENOSCOPY (EGD) WITH PROPOFOL;  Surgeon: Inda Castle, MD;  Location: [place];  Service: Endoscopy;  Laterality: N/A;  . ESOPHAGOGASTRODUODENOSCOPY (EGD) WITH PROPOFOL N/A 09/19/2016   Procedure: ESOPHAGOGASTRODUODENOSCOPY (EGD) WITH PROPOFOL;  Surgeon: Doran Stabler, MD;  Location: [facility];  Service: Gastroenterology;  Laterality: N/A;  with savary dil tt  . FRACTURE SURGERY    . ORIF ANKLE FRACTURE Right 07/04/2015   Procedure: OPEN REDUCTION INTERNAL FIXATION (ORIF) ANKLE FRACTURE;  Surgeon: Newt Minion, MD;  Location: [place];  Service: Orthopedics;  Laterality: Right;  OPEN REDUCTION, INTERNAL FIXATION OF RIGHT ANKLE FRACTURE.   [name] ORIF ANKLE FRACTURE Left 02/10/2016    Procedure: OPEN REDUCTION INTERNAL FIXATION (ORIF) ANKLE FRACTURE;  Surgeon: Newt Minion, MD;  Location: [place];  Service: Orthopedics;  Laterality: Left;  . SAVORY DILATION N/A 10/09/2014   Procedure: SAVORY DILATION;  Surgeon: Inda Castle, MD;  Location: [place];  Service: Endoscopy;  Laterality: N/A;  . SAVORY DILATION N/A 09/19/2016   Procedure: SAVORY DILATION;  Surgeon: Doran Stabler, MD;  Location: [facility];  Service: Gastroenterology;  Laterality: N/A;  . TONSILLECTOMY     Social History   Occupational History  . Retired Other   Social History Main Topics  . Smoking status: Former Smoker    Packs/day: 3.00    Years: 45.00    Types: Cigarettes    Quit date: 12/05/1997  . Smokeless tobacco: Never Used  . Alcohol use No  . Drug use: No  . Sexual activity: Not Currently

## 2017-02-24 ENCOUNTER — Other Ambulatory Visit: Payer: Self-pay | Admitting: Internal Medicine

## 2017-03-01 ENCOUNTER — Ambulatory Visit: Payer: Medicare Other | Attending: Internal Medicine

## 2017-03-01 DIAGNOSIS — R208 Other disturbances of skin sensation: Secondary | ICD-10-CM | POA: Diagnosis present

## 2017-03-01 DIAGNOSIS — M25671 Stiffness of right ankle, not elsewhere classified: Secondary | ICD-10-CM

## 2017-03-01 DIAGNOSIS — R2689 Other abnormalities of gait and mobility: Secondary | ICD-10-CM | POA: Diagnosis present

## 2017-03-01 DIAGNOSIS — M6281 Muscle weakness (generalized): Secondary | ICD-10-CM

## 2017-03-01 NOTE — Therapy (Signed)
[facility] [address] [place], [place], [address] Phone: [phone]   Fax:  [phone]  Physical Therapy Evaluation  Patient Details  Name: Megan [name] MRN: [id number] Date of Birth: [birth year] Referring Provider: Dr. Quay Burow  Encounter Date: 03/01/2017      PT End of Session - 03/01/17 1537    Visit Number 1   Number of Visits 17   Date for PT Re-Evaluation 04/30/17   Authorization Type Medicare: G-CODE AND PROGRESS NOTE EVERY 10TH VISIT.   PT Start Time 1441   PT Stop Time 1518   PT Time Calculation (min) 37 min   Equipment Utilized During Treatment Gait belt   Activity Tolerance Patient tolerated treatment well   Behavior During Therapy WFL for tasks assessed/performed      Past Medical History:  Diagnosis Date  . Cancer ([place]) dx'd 09/2013   Lung ca  . Diabetes ([place]) 09/08/2016   type I diabetic patient reported on 09/13/16   . Diabetes mellitus    insulin  . Diabetic neuropathy ([place]) 09/08/2016  . GERD (gastroesophageal reflux disease)    no meds for  . History of hiatal hernia   . Hx of radiation therapy 12/16/13-01/30/14   lung 66Gy  . Hypertension   . Malignant neoplasm of right upper lobe of lung ([place]) 11/28/2013  . Neuropathy   . Psoriasis   . Psoriatic arthritis ([place]) 09/08/2016  . Rheumatoid arthritis ([place])   . Shortness of breath dyspnea    with exertion    Past Surgical History:  Procedure Laterality Date  . ABDOMINAL HYSTERECTOMY    . ABDOMINAL SURGERY    . BALLOON DILATION N/A 12/12/2014   Procedure: BALLOON DILATION;  Surgeon: Inda Castle, MD;  Location: [facility];  Service: Endoscopy;  Laterality: N/A;  . CHOLECYSTECTOMY    . COLONOSCOPY N/A 02/07/2016   Procedure: COLONOSCOPY;  Surgeon: Doran Stabler, MD;  Location: [facility] END[facility]COPY;  Service: Endoscopy;  Laterality: N/A;  . DILATION AND CURETTAGE OF UTERUS    . ESOPHAGOGASTRODUODENOSCOPY N/A 12/12/2014   Procedure:  ESOPHAGOGASTRODUODENOSCOPY (EGD);  Surgeon: Inda Castle, MD;  Location: [facility];  Service: Endoscopy;  Laterality: N/A;  with dilation  . ESOPHAGOGASTRODUODENOSCOPY (EGD) WITH PROPOFOL N/A 10/09/2014   Procedure: ESOPHAGOGASTRODUODENOSCOPY (EGD) WITH PROPOFOL;  Surgeon: Inda Castle, MD;  Location: [facility];  Service: Endoscopy;  Laterality: N/A;  . ESOPHAGOGASTRODUODENOSCOPY (EGD) WITH PROPOFOL N/A 09/19/2016   Procedure: ESOPHAGOGASTRODUODENOSCOPY (EGD) WITH PROPOFOL;  Surgeon: Doran Stabler, MD;  Location: [facility] ENDOSCOPY;  Service: Gastroenterology;  Laterality: N/A;  with savary dil tt  . FRACTURE SURGERY    . ORIF ANKLE FRACTURE Right 07/04/2015   Procedure: OPEN REDUCTION INTERNAL FIXATION (ORIF) ANKLE FRACTURE;  Surgeon: Newt Minion, MD;  Location: [facility];  Service: Orthopedics;  Laterality: Right;  OPEN REDUCTION, INTERNAL FIXATION OF RIGHT ANKLE FRACTURE.   [name] ORIF ANKLE FRACTURE Left 02/10/2016   Procedure: OPEN REDUCTION INTERNAL FIXATION (ORIF) ANKLE FRACTURE;  Surgeon: Newt Minion, MD;  Location: [facility];  Service: Orthopedics;  Laterality: Left;  . SAVORY DILATION N/A 10/09/2014   Procedure: SAVORY DILATION;  Surgeon: Inda Castle, MD;  Location: [facility];  Service: Endoscopy;  Laterality: N/A;  . SAVORY DILATION N/A 09/19/2016   Procedure: SAVORY DILATION;  Surgeon: Doran Stabler, MD;  Location: [facility] ENDOSCOPY;  Service: Gastroenterology;  Laterality: N/A;  . TONSILLECTOMY      There were no vitals filed for  this visit.       Subjective Assessment - 03/01/17 1448    Subjective Pt reported she has been falling due to poor balance since 2014, but balance worsened after receiving a chemo drug which caused neuropathy in B hands and feet. Pt with hx of syncope. Pt recently fell on her face and had a subarachnoid hemorrage. The fall occurred when someone called her name and she turned around and stepped wrong. Pt with hx of falls over the last 1.5 years,  fractured B ankles and metatarsals s/p ORIF with no current restrictions/precautions. Pt also recently had a loose screw in R ankle repaired by Dr. Sharol Given.    Pertinent History HTN, active R lung CA (pt reports it is now "dormant" not in remission), emphysema, neuropathy in B hands/feet, RA, hx of B ankle fracture s/p ORIFs, hx of metatarsal fx's    Patient Stated Goals Improve awareness when walking, improve balance, be able to monitor her feet better   Currently in Pain? No/denies            [facility] PT Assessment - 03/01/17 1453      Assessment   Medical Diagnosis Poor balance   Referring Provider Dr. Quay Burow   [place] Date/Surgical Date 03/31/13  with pt reporting balance getting worse   Hand Dominance Right   Prior Therapy Rehab after ankle fractures     Precautions   Precautions Fall   Precaution Comments based on hx and DGI score.      Restrictions   Weight Bearing Restrictions No     Balance Screen   Has the patient fallen in the past 6 months Yes   How many times? 2   Has the patient had a decrease in activity level because of a fear of falling?  Yes   Is the patient reluctant to leave their home because of a fear of falling?  Yes     [place] residence   Living Arrangements Other (Comment)  [name]: rents a room from pt (former boyfriend)   Available Help at Discharge Friend(s)   Type of [place] to enter   Entrance Stairs-Number of Steps 1   Entrance Stairs-Rails Can reach both   [place] One level   [place] - single point;Walker - 4 wheels;Walker - 2 wheels;Crutches;Shower seat;Grab bars - tub/shower     Prior Function   Level of [place] Independent   Vocation Retired   Leisure She belongs to organizations and was on [employer] of those organizations before illness, play bridge, get together with friends     Cognition   Overall Cognitive Status Within Functional Limits for tasks  assessed     Observation/Other Assessments   Focus on Therapeutic Outcomes (FOTO)  ABC: 31.9% with scores closer to 100% indicating increased confidence in balance.      Sensation   Light Touch Impaired by gross assessment   Additional Comments Pt reported constant N/T in B hands and feet. Pt reported decr. light touch in L UE/hand and L LE.     Posture/Postural Control   Posture/Postural Control Postural limitations   Postural Limitations Forward head     ROM / Strength   AROM / PROM / Strength AROM;Strength     AROM   Overall AROM  Deficits   Overall AROM Comments BUE/LE AROM WFL except for decrease R ankle DF 2/2 hx of fractures and surgery.     Strength  Overall Strength Deficits   Overall Strength Comments B hip flex: 3+/5, knee ext: 4/5, knee flex: 4/5, R ankle DF: 2/5 (2/2 decr. ROM), L ankle DF: 3+/5. B hip abd/add tested in seated position: 4/5.     Transfers   Transfers Sit to Stand;Stand to Sit   Sit to Stand 5: Supervision;Without upper extremity assist;From chair/3-in-1   Stand to Sit 5: Supervision;Without upper extremity assist;To chair/3-in-1   Comments Pt reports difficulty performing STS txfs from low and soft surfaces.     Ambulation/Gait   Ambulation/Gait Yes   Ambulation/Gait Assistance 5: Supervision   Ambulation/Gait Assistance Details Pt amb. with intermittent use of SPC. Pt noted to experience incr. R foot pronation during gait.   Ambulation Distance (Feet) 200 Feet   Assistive device None;Straight cane   Gait Pattern Step-through pattern;Decreased stride length;Decreased dorsiflexion - left;Decreased dorsiflexion - right  R foot pronation during stance, B genu valgus   Ambulation Surface Indoor;Level   Gait velocity 2.78f/sec.  no AD     Standardized Balance Assessment   Standardized Balance Assessment Dynamic Gait Index;Timed Up and Go Test     Dynamic Gait Index   Level Surface Mild Impairment   Change in Gait Speed Mild Impairment   Gait  with Horizontal Head Turns Normal   Gait with Vertical Head Turns Moderate Impairment   Gait and Pivot Turn Mild Impairment   Step Over Obstacle Mild Impairment   Step Around Obstacles Mild Impairment   Steps Mild Impairment   Total Score 16     Timed Up and Go Test   TUG Normal TUG   Normal TUG (seconds) 11.1  no AD                           PT Education - 03/01/17 1537    Education provided Yes   Education Details PT reviewed outcome measure results, PT frequency/duration and POC.    Person(s) Educated Patient   Methods Explanation   Comprehension Verbalized understanding          PT Short Term Goals - 03/01/17 1548      PT SHORT TERM GOAL #1   Title Pt will be IND in HEP to improve balance, strength, and flexibility. TARGET DATE FOR ALL STGS: 03/29/17   Status New     PT SHORT TERM GOAL #2   Title Pt will improve DGI score to >/=18/24 to decr. falls risk.    Status New     PT SHORT TERM GOAL #3   Title Pt will improve gait speed to >/=2.610fsec. to safely amb. in the community, without AD.    Status New     PT SHORT TERM GOAL #4   Title Pt will amb. 400' over even terrain, IND, to improve functional mobility.    Status New           PT Long Term Goals - 03/01/17 1549      PT LONG TERM GOAL #1   Title Pt will verbalize understanding of falls risk prevention strategies to reduce risk of another falls. TARGET DATE FOR ALL LTGS: 04/26/17   Status New     PT LONG TERM GOAL #2   Title Pt will amb. 800' over even/uneven terrain (outdoors) with LRAD, at MOD I level to improve functional mobility.    Status New     PT LONG TERM GOAL #3   Title Pt will improve DGI score to >/=20/24 to  decr. falls risk.    Status New     PT LONG TERM GOAL #4   Title Pt will perform floor to standing txfs at MOD I level to improve safety during functional mobility.    Status New               Plan - 03/01/17 1538    Clinical Impression Statement Pt  is a pleasant 76y/o female presenting to OPPT neuro s/p fall and subarachonoid hemorrhage. Pt's PMH significant for the following: HTN, active R lung CA (pt reports it is now "dormant" not in remission), emphysema, neuropathy in B hands/feet, RA, hx of B ankle fracture s/p ORIFs, hx of metatarsal fx's. Pt's DGI score indicates pt is at risk for falls and pt's gait speed indicates pt is not able to safely amubulate in the community. Pt's rehab potential could be impacted by hx of R lung CA, which pt reports is active but dormant right now. The following deficit were noted during exam: gait deviations, impaired sensation, decr. ROM, impaired balance, decr. strength and endurance. Edema noted around B ankles. Pt very willing to participate in PT and may be able to d/c early based on progress. Pt would benefit from skilled PT to improve safety during functional mobility.    Rehab Potential Good   Clinical Impairments Affecting Rehab Potential see above   PT Frequency 2x / week   PT Duration 8 weeks   PT Treatment/Interventions ADLs/Self Care Home Management;Biofeedback;Electrical Stimulation;Cryotherapy;Neuromuscular re-education;Balance training;Therapeutic exercise;Manual techniques;Therapeutic activities;Functional mobility training;Stair training;Gait training;Orthotic Fit/Training;DME Instruction;Patient/family education;Vestibular   PT Next Visit Plan Provide falls risk handout and Initiate balance, strength, and flexibility HEP. Perform/educate on floor txfs per pt request   Consulted and Agree with Plan of Care Patient      Patient will benefit from skilled therapeutic intervention in order to improve the following deficits and impairments:  Abnormal gait, Decreased endurance, Decreased knowledge of use of DME, Increased edema, Postural dysfunction, Impaired flexibility, Decreased range of motion, Decreased balance, Decreased mobility, Decreased strength  Visit Diagnosis: Other abnormalities of  gait and mobility - Plan: PT plan of care cert/re-cert  Muscle weakness (generalized) - Plan: PT plan of care cert/re-cert  Other disturbances of skin sensation - Plan: PT plan of care cert/re-cert  Stiffness of right ankle, not elsewhere classified - Plan: PT plan of care cert/re-cert      G-Codes - 84/16/60 1552    Functional Assessment Tool Used (Outpatient Only) DGI: 16/24 and ABC: 31.9%, gait speed: 2.34f/sec without AD   Functional Limitation Mobility: Walking and moving around   Mobility: Walking and Moving Around Current Status [phone] At least 40 percent but less than 60 percent impaired, limited or restricted   Mobility: Walking and Moving Around Goal Status [phone] At least 1 percent but less than 20 percent impaired, limited or restricted       Problem List Patient Active Problem List   Diagnosis Date Noted  . Facial laceration 02/07/2017  . Poor balance 02/07/2017  . Diabetes mellitus ([facility] 01/11/2017  . Subarachnoid bleed ([facility] 01/11/2017  . Painful orthopaedic hardware ([facility] 01/05/2017  . Psoriatic arthropathy ([facility] 09/08/2016  . Psoriatic arthritis ([facility] 09/08/2016  . Hypertension 09/08/2016  . Diabetic neuropathy ([facility] 09/08/2016  . LADA (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), managed as type 1 ([facility] 05/04/2016  . Syncope 03/25/2016  . Malnutrition of moderate degree 02/29/2016  . Leukocytosis 02/17/2016  . Diabetic ketoacidosis with coma associated with diabetes mellitus due to underlying condition ([facility]   .  Abnormal finding on GI tract imaging   . Hematochezia   . RLS (restless legs syndrome) 12/11/2015  . Liver dysfunction 08/11/2015  . Lumbago 05/06/2015  . Hot flashes 04/14/2015  . Peripheral edema 04/14/2015  . Rheumatoid arthritis ([place]) 04/14/2015  . Encounter for antineoplastic immunotherapy 04/06/2015  . Emphysema of lung ([place]) 11/26/2014  . Radiation-induced esophageal stricture 10/01/2014  . Tachycardia 08/27/2014  . Depression 08/27/2014   . Palliative care encounter 08/21/2014  . DNR (do not resuscitate) discussion 08/21/2014  . Neuropathic pain 08/21/2014  . Cancer associated pain 07/29/2014  . Malignant cachexia ([place]) 07/29/2014  . Lung collapse 07/29/2014  . Fatigue 07/18/2014  . Neuropathy due to chemotherapeutic drug ([place]) 07/18/2014  . Leg cramps 07/18/2014  . Hypoalbuminemia 07/18/2014  . Thrombocytopenia, unspecified ([place]) 07/18/2014  . Dyspnea 04/28/2014  . Radiation esophagitis 04/10/2014  . Right-sided chest wall pain 04/01/2014  . Cancer of middle lobe of lung ([place]) 01/17/2014  . Malignant neoplasm of right upper lobe of lung ([place]) 11/28/2013  . Lung mass 11/09/2013    Jaysin Gayler L 03/01/2017, 3:54 PM  [facility] [address] [place], [place], [address] Phone: [phone]   Fax:  [phone]  Name: Megan [name] MRN: [id number] Date of Birth: 04-08-1941  Geoffry Paradise, PT,DPT 03/01/17 3:54 PM Phone: [phone] Fax: [phone]

## 2017-03-03 NOTE — Progress Notes (Signed)
Office Visit Note  Patient: Megan [name]             Date of Birth: 02-10-1941           MRN: [id number]             PCP: Binnie Rail, MD Referring: Binnie Rail, MD Visit Date: 03/08/2017 Occupation: '@GUAROCC'$ @    Subjective:  Right ankle pain   History of Present Illness: Megan [name] is a 76 y.o. female with history of psoriatic arthritis and osteoarthritis overlap. She states she had surgery on her right foot about 8 months ago. She had a screw in place which had to be removed about 2 months ago. She still continues to have pain and discomfort in her right foot and ankle. She had right fibula fracture in the past and has a plate there. She's been having some stiffness in her hands. He also complains of neuropathy in her bilateral hands. She has some discomfort in her left knee joint.   Activities of Daily Living:  Patient reports morning stiffness for 5 minutes.   Patient Reports nocturnal pain.  Difficulty dressing/grooming: Denies Difficulty climbing stairs: Reports Difficulty getting out of chair: Denies Difficulty using hands for taps, buttons, cutlery, and/or writing: Denies   Review of Systems  Constitutional: Positive for fatigue. Negative for night sweats, weight gain, weight loss and weakness.  HENT: Positive for mouth dryness. Negative for mouth sores, trouble swallowing, trouble swallowing and nose dryness.   Eyes: Positive for dryness. Negative for pain, redness and visual disturbance.  Respiratory: Positive for shortness of breath. Negative for cough and difficulty breathing.   Cardiovascular: Negative for chest pain, palpitations, hypertension, irregular heartbeat and swelling in legs/feet.  Gastrointestinal: Negative for blood in stool, constipation and diarrhea.  Endocrine: Negative for increased urination.  Genitourinary: Negative for vaginal dryness.  Musculoskeletal: Positive for arthralgias, joint pain and morning stiffness. Negative for  joint swelling, myalgias, muscle weakness, muscle tenderness and myalgias.  Skin: Negative for color change, rash, hair loss, skin tightness, ulcers and sensitivity to sunlight.  Allergic/Immunologic: Negative for susceptible to infections.  Neurological: Negative for dizziness, memory loss and night sweats.  Hematological: Negative for swollen glands.  Psychiatric/Behavioral: Negative for depressed mood and sleep disturbance. The patient is not nervous/anxious.     PMFS History:  Patient Active Problem List   Diagnosis Date Noted  . Primary osteoarthritis of both hands 03/08/2017  . High risk medication use 03/07/2017  . Facial laceration 02/07/2017  . Poor balance 02/07/2017  . Diabetes mellitus ([place]) 01/11/2017  . Subarachnoid bleed ([place]) 01/11/2017  . Painful orthopaedic hardware ([place]) 01/05/2017  . Psoriatic arthropathy ([place]) 09/08/2016  . Hypertension 09/08/2016  . Diabetic neuropathy ([place]) 09/08/2016  . LADA (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), managed as type 1 ([place]) 05/04/2016  . Syncope 03/25/2016  . Malnutrition of moderate degree 02/29/2016  . Leukocytosis 02/17/2016  . Diabetic ketoacidosis with coma associated with diabetes mellitus due to underlying condition ([place])   . Abnormal finding on GI tract imaging   . Hematochezia   . RLS (restless legs syndrome) 12/11/2015  . Liver dysfunction 08/11/2015  . Lumbago 05/06/2015  . Hot flashes 04/14/2015  . Peripheral edema 04/14/2015  . Psoriasis 04/14/2015  . Encounter for antineoplastic immunotherapy 04/06/2015  . Emphysema of lung ([place]) 11/26/2014  . Radiation-induced esophageal stricture 10/01/2014  . Tachycardia 08/27/2014  . Depression 08/27/2014  . Palliative care encounter 08/21/2014  . DNR (do not resuscitate) discussion 08/21/2014  .  Neuropathic pain 08/21/2014  . Cancer associated pain 07/29/2014  . Malignant cachexia ([place]) 07/29/2014  . Lung collapse 07/29/2014  . Fatigue 07/18/2014  . Neuropathy due  to chemotherapeutic drug ([place]) 07/18/2014  . Leg cramps 07/18/2014  . Hypoalbuminemia 07/18/2014  . Thrombocytopenia, unspecified ([place]) 07/18/2014  . Dyspnea 04/28/2014  . Radiation esophagitis 04/10/2014  . Right-sided chest wall pain 04/01/2014  . Cancer of middle lobe of lung ([place]) 01/17/2014  . Malignant neoplasm of right upper lobe of lung ([place]) 11/28/2013  . Lung mass 11/09/2013    Past Medical History:  Diagnosis Date  . Cancer ([place]) dx'd 09/2013   Lung ca  . Diabetes ([place]) 09/08/2016   type I diabetic patient reported on 09/13/16   . Diabetes mellitus    insulin  . Diabetic neuropathy ([place]) 09/08/2016  . GERD (gastroesophageal reflux disease)    no meds for  . History of hiatal hernia   . Hx of radiation therapy 12/16/13-01/30/14   lung 66Gy  . Hypertension   . Malignant neoplasm of right upper lobe of lung ([place]) 11/28/2013  . Neuropathy   . Psoriasis   . Psoriatic arthritis ([place]) 09/08/2016  . Rheumatoid arthritis ([place])   . Shortness of breath dyspnea    with exertion    Family History  Problem Relation Age of Onset  . Other Mother   . Other Father     failure to thrive  . Hypertension Other   . Stroke Other   . Heart attack Other   . Hemachromatosis Other   . Rheum arthritis      both sets of grandparents and father   Past Surgical History:  Procedure Laterality Date  . ABDOMINAL HYSTERECTOMY    . ABDOMINAL SURGERY    . BALLOON DILATION N/A 12/12/2014   Procedure: BALLOON DILATION;  Surgeon: Inda Castle, MD;  Location: [facility];  Service: Endoscopy;  Laterality: N/A;  . CHOLECYSTECTOMY    . COLONOSCOPY N/A 02/07/2016   Procedure: COLONOSCOPY;  Surgeon: Doran Stabler, MD;  Location: [facility];  Service: Endoscopy;  Laterality: N/A;  . DILATION AND CURETTAGE OF UTERUS    . ESOPHAGOGASTRODUODENOSCOPY N/A 12/12/2014   Procedure: ESOPHAGOGASTRODUODENOSCOPY (EGD);  Surgeon: Inda Castle, MD;  Location: [facility];  Service: Endoscopy;  Laterality:  N/A;  with dilation  . ESOPHAGOGASTRODUODENOSCOPY (EGD) WITH PROPOFOL N/A 10/09/2014   Procedure: ESOPHAGOGASTRODUODENOSCOPY (EGD) WITH PROPOFOL;  Surgeon: Inda Castle, MD;  Location: [facility];  Service: Endoscopy;  Laterality: N/A;  . ESOPHAGOGASTRODUODENOSCOPY (EGD) WITH PROPOFOL N/A 09/19/2016   Procedure: ESOPHAGOGASTRODUODENOSCOPY (EGD) WITH PROPOFOL;  Surgeon: Doran Stabler, MD;  Location: [facility];  Service: Gastroenterology;  Laterality: N/A;  with savary dil tt  . FRACTURE SURGERY    . ORIF ANKLE FRACTURE Right 07/04/2015   Procedure: OPEN REDUCTION INTERNAL FIXATION (ORIF) ANKLE FRACTURE;  Surgeon: Newt Minion, MD;  Location: [facility];  Service: Orthopedics;  Laterality: Right;  OPEN REDUCTION, INTERNAL FIXATION OF RIGHT ANKLE FRACTURE.   [name] ORIF ANKLE FRACTURE Left 02/10/2016   Procedure: OPEN REDUCTION INTERNAL FIXATION (ORIF) ANKLE FRACTURE;  Surgeon: Newt Minion, MD;  Location: [facility];  Service: Orthopedics;  Laterality: Left;  . SAVORY DILATION N/A 10/09/2014   Procedure: SAVORY DILATION;  Surgeon: Inda Castle, MD;  Location: [facility];  Service: Endoscopy;  Laterality: N/A;  . SAVORY DILATION N/A 09/19/2016   Procedure: SAVORY DILATION;  Surgeon: Doran Stabler, MD;  Location: [facility];  Service: Gastroenterology;  Laterality: N/A;  . TONSILLECTOMY     Social History   Social History Narrative   Patient lives at home alone.   Caffeine Use: 2 cups daily     Objective: Vital Signs: BP 124/78 (BP Location: Left Arm, Patient Position: Sitting, Cuff Size: Normal)   Pulse (!) 104   Ht 5' 5.5" (1.664 m)   Wt 182 lb (82.6 kg)   BMI 29.83 kg/m    Physical Exam  Constitutional: She is oriented to person, place, and time. She appears well-developed and well-nourished.  HENT:  Head: Normocephalic and atraumatic.  Eyes: Conjunctivae and EOM are normal.  Neck: Normal range of motion.  Cardiovascular: Normal rate, regular rhythm, normal heart sounds  and intact distal pulses.   Pulmonary/Chest: Effort normal and breath sounds normal.  Abdominal: Soft. Bowel sounds are normal.  Lymphadenopathy:    She has no cervical adenopathy.  Neurological: She is alert and oriented to person, place, and time.  Skin: Skin is warm and dry. Capillary refill takes less than 2 seconds.  Psychiatric: She has a normal mood and affect. Her behavior is normal.  Nursing note and vitals reviewed.    Musculoskeletal Exam: C-spine good range of motion she is some thoracic kyphosis good range of motion of her lumbar spine. Shoulder joints elbow joints wrist joints are good range of motion with no synovitis. She had no synovitis over her MCP joints. She had right CMC subluxation. No PIP/DIP swelling was noted. She is some warmth on palpation of her left knee joint. Right ankle joint appears swollen with decreased range of motion.  CDAI Exam: CDAI Homunculus Exam:   Tenderness:  RUE: carpometacarpal RLE: tibiotalar LLE: tibiofemoral  Joint Counts:  CDAI Tender Joint count: 1 CDAI Swollen Joint count: 0  Global Assessments:  Patient Global Assessment: 4 Provider Global Assessment: 4  CDAI Calculated Score: 9    Investigation: No additional findings.  CBC Latest Ref Rng & Units 02/02/2017 01/12/2017 01/11/2017  WBC 3.9 - 10.3 10e3/uL 6.6 6.0 9.1  Hemoglobin 11.6 - 15.9 g/dL 13.5 11.3(L) 13.1  Hematocrit 34.8 - 46.6 % 42.0 35.6(L) 41.0  Platelets 145 - 400 10e3/uL 174 148(L) 183    CMP Latest Ref Rng & Units 02/02/2017 01/12/2017 01/11/2017  Glucose 70 - 140 mg/dl 114 269(H) 449(H)  BUN 7.0 - 26.0 mg/dL 17.4 15 -  Creatinine 0.6 - 1.1 mg/dL 0.9 0.76 -  Sodium 136 - 145 mEq/L 140 137 -  Potassium 3.5 - 5.1 mEq/L 4.1 3.8 -  Chloride 101 - 111 mmol/L - 104 -  CO2 22 - 29 mEq/L 29 28 -  Calcium 8.4 - 10.4 mg/dL 9.9 8.5(L) -  Total Protein 6.4 - 8.3 g/dL 6.9 - -  Total Bilirubin 0.20 - 1.20 mg/dL 0.50 - -  Alkaline Phos 40 - 150 U/L 203(H) - -  AST 5 -  34 U/L 29 - -  ALT 0 - 55 U/L 28 - -    Imaging: No results found.  Speciality Comments: No specialty comments available.    Procedures:  No procedures performed Allergies: Carboplatin; Remicade [infliximab]; and Hydromorphone   Assessment / Plan:     Visit Diagnoses: Psoriatic arthropathy ([place]): She does not have much synovitis in her hands. Her left knee joint has some warmth. She also has underlying osteoarthritis which is contributing to her left knee joint discomfort. She has chronic right ankle joint issues for which she's been seeing Dr. Sharol Given.  Psoriasis: She does not have any  active lesions.  High risk medication use - Sulfasalazine 500 mg 2 tablets by mouth twice a day. She's been tolerating medication well her labs are stable. She will need labs every 3 months while she is on the medication.  Primary osteoarthritis of both hands: She has some right CMC discomfort. Joint protection and muscle strengthening discussed.  Chronic pain of right ankle: She still have some residual swelling . She had surgery on her right ankle about 2 months ago. She is unstable gait and she is going for some balance training.  Chronic midline low back pain without sciatica: Doing better  She has multiple other problems which are listed as follows:  History of lung cancer  Thrombocytopenia, unspecified (HCC)  RLS (restless legs syndrome)  Neuropathic pain  Neuropathy due to chemotherapeutic drug (HCC)  History of diabetes mellitus/ latent autoimmune diabetes  History of hypertension  History of subarachnoid hemorrhage  History of depression  History of esophagitis.(s/p Radiation)    Orders: No orders of the defined types were placed in this encounter.  Meds ordered this encounter  Medications  . sulfaSALAzine (AZULFIDINE) 500 MG EC tablet    Sig: Take 2 tablets (1,000 mg total) by mouth 2 (two) times daily.    Dispense:  120 tablet    Refill:  2    Face-to-face time  spent with patient was 30 minutes. 50% of time was spent in counseling and coordination of care.  Follow-Up Instructions: Return in about 6 months (around 09/08/2017) for Psoriatic arthritis.   Bo Merino, MD  Note - This record has been created using Editor, commissioning.  Chart creation errors have been sought, but may not always  have been located. Such creation errors do not reflect on  the standard of medical care.

## 2017-03-07 ENCOUNTER — Encounter: Payer: Self-pay | Admitting: Physical Therapy

## 2017-03-07 ENCOUNTER — Ambulatory Visit: Payer: Medicare Other | Admitting: Physical Therapy

## 2017-03-07 DIAGNOSIS — R2689 Other abnormalities of gait and mobility: Secondary | ICD-10-CM | POA: Diagnosis not present

## 2017-03-07 DIAGNOSIS — Z79899 Other long term (current) drug therapy: Secondary | ICD-10-CM | POA: Insufficient documentation

## 2017-03-07 NOTE — Patient Instructions (Addendum)
It is important to avoid accidents which may result in broken bones.  Here are a few ideas on how to make your home safer so you will be less likely to trip or fall.  1. Use nonskid mats or non slip strips in your shower or tub, on your bathroom floor and around sinks.  If you know that you have spilled water, wipe it up! 2. In the bathroom, it is important to have properly installed grab bars on the walls or on the edge of the tub.  Towel racks are NOT strong enough for you to hold onto or to pull on for support. 3. Stairs and hallways should have enough light.  Add lamps or night lights if you need ore light. 4. It is good to have handrails on both sides of the stairs if possible.  Always fix broken handrails right away. 5. It is important to see the edges of steps.  Paint the edges of outdoor steps white so you can see them better.  Put colored tape on the edge of inside steps. 6. Throw-rugs are dangerous because they can slide.  Removing the rugs is the best idea, but if they must stay, add adhesive carpet tape to prevent slipping. 7. Do not keep things on stairs or in the halls.  Remove small furniture that blocks the halls as it may cause you to trip.  Keep telephone and electrical cords out of the way where you walk. 8. Always were sturdy, rubber-soled shoes for good support.  Never wear just socks, especially on the stairs.  Socks may cause you to slip or fall.  Do not wear full-length housecoats as you can easily trip on the bottom.  Place the things you use the most on the shelves that are the easiest to reach.  If you use a stepstool, make sure it is in good condition.  If you feel unsteady, DO NOT climb, ask for help.  SIT TO STAND: Feet Narrow    Place feet shoulder width apart.. Lean chest forward. Raise hips and straighten knees to stand. _10__ reps per set, _1_ sets per day, _1-2__ days per week Hold onto a support.  Copyright  VHI. All rights reserved.   Standing  Marching   Using a chair if necessary, march in place. Repeat 10 times. Do 2 sessions per day.  http://gt2.exer.us/344   Copyright  VHI. All rights reserved.   Hip Backward Kick   Using a chair for balance, keep legs shoulder width apart and toes pointed for- ward. Slowly extend one leg back, keeping knee straight. Do not lean forward. Repeat with other leg. Repeat 10 times. Do  1-2 sessions per day.  http://gt2.exer.us/340   Copyright  VHI. All rights reserved.     Hip Side Kick   Holding a chair for balance, keep legs shoulder width apart and toes pointed forward. Swing a leg out to side, keeping knee straight. Do not lean. Repeat using other leg. Repeat 10 times. Do 1-2 sessions per day.  http://gt2.exer.us/342   Copyright  VHI. All rights reserved.        Standing On One Leg Without Support .  Stand on one leg in neutral spine without support. Hold 10 seconds. Repeat on other leg. Do 2 repetitions, 1 sets.  http://bt.exer.us/36   Copyright  VHI. All rights reserved.            Braiding   Move to side: 1) cross right leg in front of left, 2) bring  back leg out to side, then 3) cross right leg behind left, 4) bring left leg out to side. Continue sequence in same direction. Reverse sequence, moving in opposite direction. Repeat sequence 2 times per session. Do 1 sessions per day.   Copyright  VHI. All rights reserved.

## 2017-03-08 ENCOUNTER — Ambulatory Visit (INDEPENDENT_AMBULATORY_CARE_PROVIDER_SITE_OTHER): Payer: Medicare Other | Admitting: Rheumatology

## 2017-03-08 ENCOUNTER — Encounter: Payer: Self-pay | Admitting: Rheumatology

## 2017-03-08 ENCOUNTER — Encounter: Payer: Self-pay | Admitting: Physical Therapy

## 2017-03-08 VITALS — BP 124/78 | HR 104 | Ht 65.5 in | Wt 182.0 lb

## 2017-03-08 DIAGNOSIS — D696 Thrombocytopenia, unspecified: Secondary | ICD-10-CM

## 2017-03-08 DIAGNOSIS — G620 Drug-induced polyneuropathy: Secondary | ICD-10-CM

## 2017-03-08 DIAGNOSIS — Z8659 Personal history of other mental and behavioral disorders: Secondary | ICD-10-CM | POA: Diagnosis not present

## 2017-03-08 DIAGNOSIS — M19042 Primary osteoarthritis, left hand: Secondary | ICD-10-CM

## 2017-03-08 DIAGNOSIS — M545 Low back pain: Secondary | ICD-10-CM

## 2017-03-08 DIAGNOSIS — Z8639 Personal history of other endocrine, nutritional and metabolic disease: Secondary | ICD-10-CM

## 2017-03-08 DIAGNOSIS — Z79899 Other long term (current) drug therapy: Secondary | ICD-10-CM

## 2017-03-08 DIAGNOSIS — G2581 Restless legs syndrome: Secondary | ICD-10-CM | POA: Diagnosis not present

## 2017-03-08 DIAGNOSIS — M792 Neuralgia and neuritis, unspecified: Secondary | ICD-10-CM

## 2017-03-08 DIAGNOSIS — Z85118 Personal history of other malignant neoplasm of bronchus and lung: Secondary | ICD-10-CM | POA: Diagnosis not present

## 2017-03-08 DIAGNOSIS — T451X5A Adverse effect of antineoplastic and immunosuppressive drugs, initial encounter: Secondary | ICD-10-CM

## 2017-03-08 DIAGNOSIS — L4050 Arthropathic psoriasis, unspecified: Secondary | ICD-10-CM | POA: Diagnosis not present

## 2017-03-08 DIAGNOSIS — Z8679 Personal history of other diseases of the circulatory system: Secondary | ICD-10-CM

## 2017-03-08 DIAGNOSIS — L409 Psoriasis, unspecified: Secondary | ICD-10-CM | POA: Diagnosis not present

## 2017-03-08 DIAGNOSIS — M25571 Pain in right ankle and joints of right foot: Secondary | ICD-10-CM

## 2017-03-08 DIAGNOSIS — Z8719 Personal history of other diseases of the digestive system: Secondary | ICD-10-CM

## 2017-03-08 DIAGNOSIS — G8929 Other chronic pain: Secondary | ICD-10-CM

## 2017-03-08 DIAGNOSIS — M19041 Primary osteoarthritis, right hand: Secondary | ICD-10-CM

## 2017-03-08 MED ORDER — SULFASALAZINE 500 MG PO TBEC: 1000.0000 mg | DELAYED_RELEASE_TABLET | Freq: Two times a day (BID) | ORAL | 2 refills | Status: DC

## 2017-03-08 NOTE — Patient Instructions (Signed)
Standing Labs We placed an order today for your standing lab work.    Please come back and get your standing labs in July and every 3 months.  We have open lab Monday through Friday from 8:30-11:30 AM and 1:30-4 PM at the office of Dr. Tresa Moore, PA.   The office is located at [address] [place], [place], [place] [address] No appointment is necessary.   Labs are drawn by [facility].  You may receive a bill from [facility] for your lab work.

## 2017-03-08 NOTE — Therapy (Signed)
[facility] [address] [place], [place], [address] Phone: [phone]   Fax:  [phone]  Physical Therapy Treatment  Patient Details  Name: Megan [name] MRN: [id number] Date of Birth: 10/19/1941 Referring Provider: Dr. Quay Burow  Encounter Date: 03/07/2017      PT End of Session - 03/08/17 2059    Visit Number 2   Number of Visits 17   Date for PT Re-Evaluation 04/30/17   Authorization Type Medicare: G-CODE AND PROGRESS NOTE EVERY 10TH VISIT.   PT Start Time 1316   PT Stop Time 1402   PT Time Calculation (min) 46 min      Past Medical History:  Diagnosis Date  . Cancer ([place]) dx'd 09/2013   Lung ca  . Diabetes ([place]) 09/08/2016   type I diabetic patient reported on 09/13/16   . Diabetes mellitus    insulin  . Diabetic neuropathy ([place]) 09/08/2016  . GERD (gastroesophageal reflux disease)    no meds for  . History of hiatal hernia   . Hx of radiation therapy 12/16/13-01/30/14   lung 66Gy  . Hypertension   . Malignant neoplasm of right upper lobe of lung ([place]) 11/28/2013  . Neuropathy   . Psoriasis   . Psoriatic arthritis ([place]) 09/08/2016  . Rheumatoid arthritis ([place])   . Shortness of breath dyspnea    with exertion    Past Surgical History:  Procedure Laterality Date  . ABDOMINAL HYSTERECTOMY    . ABDOMINAL SURGERY    . BALLOON DILATION N/A 12/12/2014   Procedure: BALLOON DILATION;  Surgeon: Inda Castle, MD;  Location: [facility];  Service: Endoscopy;  Laterality: N/A;  . CHOLECYSTECTOMY    . COLONOSCOPY N/A 02/07/2016   Procedure: COLONOSCOPY;  Surgeon: Doran Stabler, MD;  Location: [facility] ENDOSCOPY;  Service: Endoscopy;  Laterality: N/A;  . DILATION AND CURETTAGE OF UTERUS    . ESOPHAGOGASTRODUODENOSCOPY N/A 12/12/2014   Procedure: ESOPHAGOGASTRODUODENOSCOPY (EGD);  Surgeon: Inda Castle, MD;  Location: [facility];  Service: Endoscopy;  Laterality: N/A;  with dilation  .  ESOPHAGOGASTRODUODENOSCOPY (EGD) WITH PROPOFOL N/A 10/09/2014   Procedure: ESOPHAGOGASTRODUODENOSCOPY (EGD) WITH PROPOFOL;  Surgeon: Inda Castle, MD;  Location: [facility];  Service: Endoscopy;  Laterality: N/A;  . ESOPHAGOGASTRODUODENOSCOPY (EGD) WITH PROPOFOL N/A 09/19/2016   Procedure: ESOPHAGOGASTRODUODENOSCOPY (EGD) WITH PROPOFOL;  Surgeon: Doran Stabler, MD;  Location: [facility] ENDOSCOPY;  Service: Gastroenterology;  Laterality: N/A;  with savary dil tt  . FRACTURE SURGERY    . ORIF ANKLE FRACTURE Right 07/04/2015   Procedure: OPEN REDUCTION INTERNAL FIXATION (ORIF) ANKLE FRACTURE;  Surgeon: Newt Minion, MD;  Location: [facility];  Service: Orthopedics;  Laterality: Right;  OPEN REDUCTION, INTERNAL FIXATION OF RIGHT ANKLE FRACTURE.   [name] ORIF ANKLE FRACTURE Left 02/10/2016   Procedure: OPEN REDUCTION INTERNAL FIXATION (ORIF) ANKLE FRACTURE;  Surgeon: Newt Minion, MD;  Location: [facility];  Service: Orthopedics;  Laterality: Left;  . SAVORY DILATION N/A 10/09/2014   Procedure: SAVORY DILATION;  Surgeon: Inda Castle, MD;  Location: [facility];  Service: Endoscopy;  Laterality: N/A;  . SAVORY DILATION N/A 09/19/2016   Procedure: SAVORY DILATION;  Surgeon: Doran Stabler, MD;  Location: [facility] ENDOSCOPY;  Service: Gastroenterology;  Laterality: N/A;  . TONSILLECTOMY      There were no vitals filed for this visit.      Subjective Assessment - 03/08/17 2049    Subjective Pt reports no problems since PT eval last week;  reports ankles are swollen with Rt worse than Lt; pt using cane for assistance with amb.   Pertinent History HTN, active R lung CA (pt reports it is now "dormant" not in remission), emphysema, neuropathy in B hands/feet, RA, hx of B ankle fracture s/p ORIFs, hx of metatarsal fx's    Patient Stated Goals Improve awareness when walking, improve balance, be able to monitor her feet better   Currently in Pain? No/denies                         [facility] Adult PT  Treatment/Exercise - 03/08/17 0001      Transfers   Transfers Sit to Stand;Stand to Sit   Sit to Stand 5: Supervision   Number of Reps 10 reps   Comments pt able to perform without stabilizing LE's against mat table     Exercises   Exercises Ankle     Knee/Hip Exercises: Stretches   Active Hamstring Stretch Both;1 rep;30 seconds  runner's stretch     Ankle Exercises: Stretches   Gastroc Stretch 1 rep;30 seconds  2" block used     Ankle Exercises: Seated   Heel Raises 10 reps   Toe Raise 10 reps             Balance Exercises - 03/08/17 2051      Balance Exercises: Standing   SLS Eyes open;Intermittent upper extremity support;10 secs   Sidestepping 2 reps     Pt performed balance exercises for HEP - forward, back and side kicks x 10 reps each Ankle sways x 10 reps; marching in place x 10 reps: SLS 10 sec x 2 reps each leg with UE support Tandem stance 30 secs x 2 reps (with UE support) - partial tandem stance Braiding along counter top 2 reps with CGA and cues for sequence      PT Education - 03/08/17 2057    Education provided Yes   Education Details Balance HEP; hamstring and heel cord stretch   Person(s) Educated Patient   Methods Explanation;Demonstration;Handout   Comprehension Verbalized understanding;Returned demonstration          PT Short Term Goals - 03/01/17 1548      PT SHORT TERM GOAL #1   Title Pt will be IND in HEP to improve balance, strength, and flexibility. TARGET DATE FOR ALL STGS: 03/29/17   Status New     PT SHORT TERM GOAL #2   Title Pt will improve DGI score to >/=18/24 to decr. falls risk.    Status New     PT SHORT TERM GOAL #3   Title Pt will improve gait speed to >/=2.51f/sec. to safely amb. in the community, without AD.    Status New     PT SHORT TERM GOAL #4   Title Pt will amb. 400' over even terrain, IND, to improve functional mobility.    Status New           PT Long Term Goals - 03/01/17 1549      PT LONG  TERM GOAL #1   Title Pt will verbalize understanding of falls risk prevention strategies to reduce risk of another falls. TARGET DATE FOR ALL LTGS: 04/26/17   Status New     PT LONG TERM GOAL #2   Title Pt will amb. 800' over even/uneven terrain (outdoors) with LRAD, at MOD I level to improve functional mobility.    Status New     PT LONG TERM  GOAL #3   Title Pt will improve DGI score to >/=20/24 to decr. falls risk.    Status New     PT LONG TERM GOAL #4   Title Pt will perform floor to standing txfs at MOD I level to improve safety during functional mobility.    Status New               Plan - 03/08/17 2059    Clinical Impression Statement Pt presents with high level balance deficits, requiring UE support intermittently for support for safety; pt required frequent seated rest periods due to c/o fatigue   Rehab Potential Good   PT Frequency 2x / week   PT Duration 8 weeks   PT Treatment/Interventions ADLs/Self Care Home Management;Biofeedback;Electrical Stimulation;Cryotherapy;Neuromuscular re-education;Balance training;Therapeutic exercise;Manual techniques;Therapeutic activities;Functional mobility training;Stair training;Gait training;Orthotic Fit/Training;DME Instruction;Patient/family education;Vestibular   PT Next Visit Plan Review HEP and modify prn:  floor to stand transfer ? ( this was not performed in 1st session as in plan)   PT Home Exercise Plan balance and heel cord and hamstring stretching   Consulted and Agree with Plan of Care Patient      Patient will benefit from skilled therapeutic intervention in order to improve the following deficits and impairments:  Abnormal gait, Decreased endurance, Decreased knowledge of use of DME, Increased edema, Postural dysfunction, Impaired flexibility, Decreased range of motion, Decreased balance, Decreased mobility, Decreased strength  Visit Diagnosis: Other abnormalities of gait and mobility     Problem List Patient  Active Problem List   Diagnosis Date Noted  . Primary osteoarthritis of both hands 03/08/2017  . High risk medication use 03/07/2017  . Facial laceration 02/07/2017  . Poor balance 02/07/2017  . Diabetes mellitus ([place]) 01/11/2017  . Subarachnoid bleed ([place]) 01/11/2017  . Painful orthopaedic hardware ([place]) 01/05/2017  . Psoriatic arthropathy ([place]) 09/08/2016  . Hypertension 09/08/2016  . Diabetic neuropathy ([place]) 09/08/2016  . LADA (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), managed as type 1 ([place]) 05/04/2016  . Syncope 03/25/2016  . Malnutrition of moderate degree 02/29/2016  . Leukocytosis 02/17/2016  . Diabetic ketoacidosis with coma associated with diabetes mellitus due to underlying condition ([place])   . Abnormal finding on GI tract imaging   . Hematochezia   . RLS (restless legs syndrome) 12/11/2015  . Liver dysfunction 08/11/2015  . Lumbago 05/06/2015  . Hot flashes 04/14/2015  . Peripheral edema 04/14/2015  . Psoriasis 04/14/2015  . Encounter for antineoplastic immunotherapy 04/06/2015  . Emphysema of lung ([place]) 11/26/2014  . Radiation-induced esophageal stricture 10/01/2014  . Tachycardia 08/27/2014  . Depression 08/27/2014  . Palliative care encounter 08/21/2014  . DNR (do not resuscitate) discussion 08/21/2014  . Neuropathic pain 08/21/2014  . Cancer associated pain 07/29/2014  . Malignant cachexia ([place]) 07/29/2014  . Lung collapse 07/29/2014  . Fatigue 07/18/2014  . Neuropathy due to chemotherapeutic drug ([place]) 07/18/2014  . Leg cramps 07/18/2014  . Hypoalbuminemia 07/18/2014  . Thrombocytopenia, unspecified ([place]) 07/18/2014  . Dyspnea 04/28/2014  . Radiation esophagitis 04/10/2014  . Right-sided chest wall pain 04/01/2014  . Cancer of middle lobe of lung ([place]) 01/17/2014  . Malignant neoplasm of right upper lobe of lung ([place]) 11/28/2013  . Lung mass 11/09/2013    Alda Lea, PT 03/08/2017, 9:05 PM  [facility] [address] [place], [place], [address] Phone: [phone]   Fax:  [phone]  Name: NASTASSJA WITKOP MRN: [id number] Date of Birth: [birth year]

## 2017-03-10 ENCOUNTER — Encounter: Payer: Self-pay | Admitting: Rehabilitation

## 2017-03-10 ENCOUNTER — Ambulatory Visit: Payer: Medicare Other | Admitting: Rehabilitation

## 2017-03-10 DIAGNOSIS — R2689 Other abnormalities of gait and mobility: Secondary | ICD-10-CM | POA: Diagnosis not present

## 2017-03-10 DIAGNOSIS — R208 Other disturbances of skin sensation: Secondary | ICD-10-CM

## 2017-03-10 DIAGNOSIS — M6281 Muscle weakness (generalized): Secondary | ICD-10-CM

## 2017-03-10 NOTE — Therapy (Signed)
[facility] [address] [place], [place], [address] Phone: [phone]   Fax:  [phone]  Physical Therapy Treatment  Patient Details  Name: Megan [name] MRN: [id number] Date of Birth: 1941/09/09 Referring Provider: Dr. Quay Burow  Encounter Date: 03/10/2017      PT End of Session - 03/10/17 1121    Visit Number 3   Number of Visits 17   Date for PT Re-Evaluation 04/30/17   Authorization Type Medicare: G-CODE AND PROGRESS NOTE EVERY 10TH VISIT.   PT Start Time 1017   PT Stop Time 1100   PT Time Calculation (min) 43 min   Activity Tolerance Patient tolerated treatment well   Behavior During Therapy WFL for tasks assessed/performed      Past Medical History:  Diagnosis Date  . Cancer ([place]) dx'd 09/2013   Lung ca  . Diabetes ([place]) 09/08/2016   type I diabetic patient reported on 09/13/16   . Diabetes mellitus    insulin  . Diabetic neuropathy ([place]) 09/08/2016  . GERD (gastroesophageal reflux disease)    no meds for  . History of hiatal hernia   . Hx of radiation therapy 12/16/13-01/30/14   lung 66Gy  . Hypertension   . Malignant neoplasm of right upper lobe of lung ([place]) 11/28/2013  . Neuropathy   . Psoriasis   . Psoriatic arthritis ([place]) 09/08/2016  . Rheumatoid arthritis ([place])   . Shortness of breath dyspnea    with exertion    Past Surgical History:  Procedure Laterality Date  . ABDOMINAL HYSTERECTOMY    . ABDOMINAL SURGERY    . BALLOON DILATION N/A 12/12/2014   Procedure: BALLOON DILATION;  Surgeon: Inda Castle, MD;  Location: [facility];  Service: Endoscopy;  Laterality: N/A;  . CHOLECYSTECTOMY    . COLONOSCOPY N/A 02/07/2016   Procedure: COLONOSCOPY;  Surgeon: Doran Stabler, MD;  Location[facility] END[facility]COPY;  Service: Endoscopy;  Laterality: N/A;  . DILATION AND CURETTAGE OF UTERUS    . ESOPHAGOGASTRODUODENOSCOPY N/A 12/12/2014   Procedure: ESOPHAGOGASTRODUODENOSCOPY (EGD);  Surgeon: Inda Castle, MD;  Location: [facility];  Service: Endoscopy;  Laterality: N/A;  with dilation  . ESOPHAGOGASTRODUODENOSCOPY (EGD) WITH PROPOFOL N/A 10/09/2014   Procedure: ESOPHAGOGASTRODUODENOSCOPY (EGD) WITH PROPOFOL;  Surgeon: Inda Castle, MD;  Location: [facility];  Service: Endoscopy;  Laterality: N/A;  . ESOPHAGOGASTRODUODENOSCOPY (EGD) WITH PROPOFOL N/A 09/19/2016   Procedure: ESOPHAGOGASTRODUODENOSCOPY (EGD) WITH PROPOFOL;  Surgeon: Doran Stabler, MD;  Location: [facility];  Service: Gastroenterology;  Laterality: N/A;  with savary dil tt  . FRACTURE SURGERY    . ORIF ANKLE FRACTURE Right 07/04/2015   Procedure: OPEN REDUCTION INTERNAL FIXATION (ORIF) ANKLE FRACTURE;  Surgeon: Newt Minion, MD;  Location: [facility];  Service: Orthopedics;  Laterality: Right;  OPEN REDUCTION, INTERNAL FIXATION OF RIGHT ANKLE FRACTURE.   [name] ORIF ANKLE FRACTURE Left 02/10/2016   Procedure: OPEN REDUCTION INTERNAL FIXATION (ORIF) ANKLE FRACTURE;  Surgeon: Newt Minion, MD;  Location: [facility];  Service: Orthopedics;  Laterality: Left;  . SAVORY DILATION N/A 10/09/2014   Procedure: SAVORY DILATION;  Surgeon: Inda Castle, MD;  Location: [facility];  Service: Endoscopy;  Laterality: N/A;  . SAVORY DILATION N/A 09/19/2016   Procedure: SAVORY DILATION;  Surgeon: Doran Stabler, MD;  Location: [facility];  Service: Gastroenterology;  Laterality: N/A;  . TONSILLECTOMY      There were no vitals filed for this visit.      Subjective  Assessment - 03/10/17 1032    Subjective Pt with no memory of evaluation, but did recall last visit.  Pt reports doing exercises once, had some soreness in hips.    Pertinent History HTN, active R lung CA (pt reports it is now "dormant" not in remission), emphysema, neuropathy in B hands/feet, RA, hx of B ankle fracture s/p ORIFs, hx of metatarsal fx's    Patient Stated Goals Improve awareness when walking, improve balance, be able to monitor her feet better   Currently in  Pain? No/denies                         [facility] Adult PT Treatment/Exercise - 03/10/17 1048      Self-Care   Self-Care Other Self-Care Comments   Other Self-Care Comments  Discussed pursed lip breathing during session due to noted holding breath during exercises.  She is familiar with this from past pulmonary rehab, but needs cues during function to carry over.  Also educated on benefits of wearing more supportive shoes (her diabetic shoes) to provide support to inside of foot at navicular bone as it is very dropped and she tends to over pronate in current shoes.       Neuro Re-ed    Neuro Re-ed Details  In // bars worked on high level balance; standing on rocker board in horizontal bias working from [employer] support>single UE support>no UE support x 15 secs x 3 sets.  Progressed to standing on rocker board (horizontal) without support performing head turns up/down x 10 reps, side/side x 10 reps.  Ended balance exercises with gait x 145' with ball toss.  Cues for larger toss to increase balance challenge.  Min/guard intermittently, but no overt LOB.      Exercises   Exercises Other Exercises   Other Exercises  Went over current HEP with cues for sit<>stand exercise.  All others went well, see pt instruction for details on exercises and reps performed.                  PT Education - 03/10/17 1121    Education provided Yes   Education Details HEP, see self care   Person(s) Educated Patient   Methods Explanation   Comprehension Verbalized understanding          PT Short Term Goals - 03/01/17 1548      PT SHORT TERM GOAL #1   Title Pt will be IND in HEP to improve balance, strength, and flexibility. TARGET DATE FOR ALL STGS: 03/29/17   Status New     PT SHORT TERM GOAL #2   Title Pt will improve DGI score to >/=18/24 to decr. falls risk.    Status New     PT SHORT TERM GOAL #3   Title Pt will improve gait speed to >/=2.53f/sec. to safely amb. in the community,  without AD.    Status New     PT SHORT TERM GOAL #4   Title Pt will amb. 400' over even terrain, IND, to improve functional mobility.    Status New           PT Long Term Goals - 03/01/17 1549      PT LONG TERM GOAL #1   Title Pt will verbalize understanding of falls risk prevention strategies to reduce risk of another falls. TARGET DATE FOR ALL LTGS: 04/26/17   Status New     PT LONG TERM GOAL #2   Title Pt  will amb. 800' over even/uneven terrain (outdoors) with LRAD, at MOD I level to improve functional mobility.    Status New     PT LONG TERM GOAL #3   Title Pt will improve DGI score to >/=20/24 to decr. falls risk.    Status New     PT LONG TERM GOAL #4   Title Pt will perform floor to standing txfs at MOD I level to improve safety during functional mobility.    Status New               Plan - 03/10/17 1122    Clinical Impression Statement Reviewed pts HEP with cues needed for correct technique, cues for breathing throughout mobility.  Also continue to focus on high level balance with vestibular challenges and also recommendations for more supportive foot wear as her shoes are very soft and she tends to over pronate due to dropped navicular bone.    Rehab Potential Good   PT Frequency 2x / week   PT Duration 8 weeks   PT Treatment/Interventions ADLs/Self Care Home Management;Biofeedback;Electrical Stimulation;Cryotherapy;Neuromuscular re-education;Balance training;Therapeutic exercise;Manual techniques;Therapeutic activities;Functional mobility training;Stair training;Gait training;Orthotic Fit/Training;DME Instruction;Patient/family education;Vestibular   PT Next Visit Plan  floor to stand transfer ? ( this was not performed in 1st session as in plan), continue with high level balance-SLS, compliant surfaces   PT Home Exercise Plan balance and heel cord and hamstring stretching   Consulted and Agree with Plan of Care Patient      Patient will benefit from  skilled therapeutic intervention in order to improve the following deficits and impairments:  Abnormal gait, Decreased endurance, Decreased knowledge of use of DME, Increased edema, Postural dysfunction, Impaired flexibility, Decreased range of motion, Decreased balance, Decreased mobility, Decreased strength  Visit Diagnosis: Other abnormalities of gait and mobility  Muscle weakness (generalized)  Other disturbances of skin sensation     Problem List Patient Active Problem List   Diagnosis Date Noted  . Primary osteoarthritis of both hands 03/08/2017  . High risk medication use 03/07/2017  . Facial laceration 02/07/2017  . Poor balance 02/07/2017  . Diabetes mellitus ([place]) 01/11/2017  . Subarachnoid bleed ([place]) 01/11/2017  . Painful orthopaedic hardware ([place]) 01/05/2017  . Psoriatic arthropathy ([place]) 09/08/2016  . Hypertension 09/08/2016  . Diabetic neuropathy ([place]) 09/08/2016  . LADA (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), managed as type 1 ([place]) 05/04/2016  . Syncope 03/25/2016  . Malnutrition of moderate degree 02/29/2016  . Leukocytosis 02/17/2016  . Diabetic ketoacidosis with coma associated with diabetes mellitus due to underlying condition ([place])   . Abnormal finding on GI tract imaging   . Hematochezia   . RLS (restless legs syndrome) 12/11/2015  . Liver dysfunction 08/11/2015  . Lumbago 05/06/2015  . Hot flashes 04/14/2015  . Peripheral edema 04/14/2015  . Psoriasis 04/14/2015  . Encounter for antineoplastic immunotherapy 04/06/2015  . Emphysema of lung ([place]) 11/26/2014  . Radiation-induced esophageal stricture 10/01/2014  . Tachycardia 08/27/2014  . Depression 08/27/2014  . Palliative care encounter 08/21/2014  . DNR (do not resuscitate) discussion 08/21/2014  . Neuropathic pain 08/21/2014  . Cancer associated pain 07/29/2014  . Malignant cachexia ([place]) 07/29/2014  . Lung collapse 07/29/2014  . Fatigue 07/18/2014  . Neuropathy due to chemotherapeutic drug  ([place]) 07/18/2014  . Leg cramps 07/18/2014  . Hypoalbuminemia 07/18/2014  . Thrombocytopenia, unspecified ([place]) 07/18/2014  . Dyspnea 04/28/2014  . Radiation esophagitis 04/10/2014  . Right-sided chest wall pain 04/01/2014  . Cancer of middle lobe of lung (  [place]) 01/17/2014  . Malignant neoplasm of right upper lobe of lung ([place]) 11/28/2013  . Lung mass 11/09/2013    Cameron Sprang, PT, MPT [facility] [address] [place], [place], [address] Phone: [phone]   Fax:  [phone] 03/10/17, 11:25 AM  Name: ROAN SAWCHUK MRN: [id number] Date of Birth: 05/15/1941

## 2017-03-10 NOTE — Patient Instructions (Signed)
   Place feet shoulder width apart.. Lean chest forward. Raise hips and straighten knees to stand. _10__ reps per set, _1_ sets per day, _1-2__ days per week Hold onto a support.  Copyright  VHI. All rights reserved.   Standing Marching   Using a chair if necessary, march in place. Repeat 10 times. Do 2 sessions per day.  http://gt2.exer.us/344   Copyright  VHI. All rights reserved.   Hip Backward Kick   Using a chair for balance, keep legs shoulder width apart and toes pointed for- ward. Slowly extend one leg back, keeping knee straight. Do not lean forward. Repeat with other leg. Repeat 10 times. Do  1-2 sessions per day.  http://gt2.exer.us/340   Copyright  VHI. All rights reserved.     Hip Side Kick   Holding a chair for balance, keep legs shoulder width apart and toes pointed forward. Swing a leg out to side, keeping knee straight. Do not lean. Repeat using other leg. Repeat 10 times. Do 1-2 sessions per day.  http://gt2.exer.us/342   Copyright  VHI. All rights reserved.        Standing On One Leg Without Support .  Stand on one leg in neutral spine without support. Hold 10 seconds. Repeat on other leg. Do 2 repetitions, 1 sets.  http://bt.exer.us/36   Copyright  VHI. All rights reserved.            Braiding   Move to side: 1) cross right leg in front of left, 2) bring back leg out to side, then 3) cross right leg behind left, 4) bring left leg out to side. Continue sequence in same direction. Reverse sequence, moving in opposite direction. Repeat sequence 2 times per session. Do 1 sessions per day.   Copyright  VHI. All rights reserved.

## 2017-03-13 ENCOUNTER — Other Ambulatory Visit: Payer: Self-pay | Admitting: Internal Medicine

## 2017-03-15 ENCOUNTER — Ambulatory Visit: Payer: Medicare Other | Admitting: Physical Therapy

## 2017-03-15 ENCOUNTER — Encounter: Payer: Self-pay | Admitting: Physical Therapy

## 2017-03-15 DIAGNOSIS — R2689 Other abnormalities of gait and mobility: Secondary | ICD-10-CM

## 2017-03-15 DIAGNOSIS — R208 Other disturbances of skin sensation: Secondary | ICD-10-CM

## 2017-03-15 DIAGNOSIS — M6281 Muscle weakness (generalized): Secondary | ICD-10-CM

## 2017-03-15 NOTE — Therapy (Signed)
[facility] [address] [place], [place], [address] Phone: [phone]   Fax:  [phone]  Physical Therapy Treatment  Patient Details  Name: Megan [name] MRN: [id number] Date of Birth: [birth year] Referring Provider: Dr. Quay Burow  Encounter Date: 03/15/2017      PT End of Session - 03/15/17 1448    Visit Number 4   Number of Visits 17   Date for PT Re-Evaluation 04/30/17   Authorization Type Medicare: G-CODE AND PROGRESS NOTE EVERY 10TH VISIT.   PT Start Time 1403   PT Stop Time 1444   PT Time Calculation (min) 41 min   Activity Tolerance Patient tolerated treatment well   Behavior During Therapy WFL for tasks assessed/performed      Past Medical History:  Diagnosis Date  . Cancer ([place]) dx'd 09/2013   Lung ca  . Diabetes ([place]) 09/08/2016   type I diabetic patient reported on 09/13/16   . Diabetes mellitus    insulin  . Diabetic neuropathy ([place]) 09/08/2016  . GERD (gastroesophageal reflux disease)    no meds for  . History of hiatal hernia   . Hx of radiation therapy 12/16/13-01/30/14   lung 66Gy  . Hypertension   . Malignant neoplasm of right upper lobe of lung ([place]) 11/28/2013  . Neuropathy   . Psoriasis   . Psoriatic arthritis ([place]) 09/08/2016  . Rheumatoid arthritis ([place])   . Shortness of breath dyspnea    with exertion    Past Surgical History:  Procedure Laterality Date  . ABDOMINAL HYSTERECTOMY    . ABDOMINAL SURGERY    . BALLOON DILATION N/A 12/12/2014   Procedure: BALLOON DILATION;  Surgeon: Inda Castle, MD;  Location: [facility];  Service: Endoscopy;  Laterality: N/A;  . CHOLECYSTECTOMY    . COLONOSCOPY N/A 02/07/2016   Procedure: COLONOSCOPY;  Surgeon: Doran Stabler, MD;  Location: [facility] ENDOSCOPY;  Service: Endoscopy;  Laterality: N/A;  . DILATION AND CURETTAGE OF UTERUS    . ESOPHAGOGASTRODUODENOSCOPY N/A 12/12/2014   Procedure: ESOPHAGOGASTRODUODENOSCOPY (EGD);  Surgeon: Inda Castle, MD;  Location: [facility];  Service: Endoscopy;  Laterality: N/A;  with dilation  . ESOPHAGOGASTRODUODENOSCOPY (EGD) WITH PROPOFOL N/A 10/09/2014   Procedure: ESOPHAGOGASTRODUODENOSCOPY (EGD) WITH PROPOFOL;  Surgeon: Inda Castle, MD;  Location: [facility];  Service: Endoscopy;  Laterality: N/A;  . ESOPHAGOGASTRODUODENOSCOPY (EGD) WITH PROPOFOL N/A 09/19/2016   Procedure: ESOPHAGOGASTRODUODENOSCOPY (EGD) WITH PROPOFOL;  Surgeon: Doran Stabler, MD;  Location: [facility] ENDOSCOPY;  Service: Gastroenterology;  Laterality: N/A;  with savary dil tt  . FRACTURE SURGERY    . ORIF ANKLE FRACTURE Right 07/04/2015   Procedure: OPEN REDUCTION INTERNAL FIXATION (ORIF) ANKLE FRACTURE;  Surgeon: Newt Minion, MD;  Location: [facility];  Service: Orthopedics;  Laterality: Right;  OPEN REDUCTION, INTERNAL FIXATION OF RIGHT ANKLE FRACTURE.   [name] ORIF ANKLE FRACTURE Left 02/10/2016   Procedure: OPEN REDUCTION INTERNAL FIXATION (ORIF) ANKLE FRACTURE;  Surgeon: Newt Minion, MD;  Location: [facility];  Service: Orthopedics;  Laterality: Left;  . SAVORY DILATION N/A 10/09/2014   Procedure: SAVORY DILATION;  Surgeon: Inda Castle, MD;  Location: [facility];  Service: Endoscopy;  Laterality: N/A;  . SAVORY DILATION N/A 09/19/2016   Procedure: SAVORY DILATION;  Surgeon: Doran Stabler, MD;  Location: [facility] ENDOSCOPY;  Service: Gastroenterology;  Laterality: N/A;  . TONSILLECTOMY      There were no vitals filed for this visit.      Subjective  Assessment - 03/15/17 1405    Subjective Pt has been working on some of the exercises at home.   Pertinent History HTN, active R lung CA (pt reports it is now "dormant" not in remission), emphysema, neuropathy in B hands/feet, RA, hx of B ankle fracture s/p ORIFs, hx of metatarsal fx's    Patient Stated Goals Improve awareness when walking, improve balance, be able to monitor her feet better   Currently in Pain? No/denies                         [facility]  Adult PT Treatment/Exercise - 03/15/17 0001      Ambulation/Gait   Ambulation/Gait Yes   Ambulation/Gait Assistance 5: Supervision;4: Min guard  min guard working on head turns   Ambulation/Gait Assistance Details working on activity tolerance, cues for arm swing.  balance with head turns using SPC   Ambulation Distance (Feet) 400 Feet   Assistive device None;Straight cane   Gait Pattern Step-through pattern;Decreased stride length;Decreased dorsiflexion - left;Decreased dorsiflexion - right   Ambulation Surface Level;Indoor                PT Education - 03/15/17 1432    Education provided Yes   Education Details Performed HEP as written, see self care, min cues for technique.   Person(s) Educated Patient   Methods Explanation;Demonstration;Verbal cues;Handout   Comprehension Verbalized understanding;Returned demonstration;Verbal cues required          PT Short Term Goals - 03/01/17 1548      PT SHORT TERM GOAL #1   Title Pt will be IND in HEP to improve balance, strength, and flexibility. TARGET DATE FOR ALL STGS: 03/29/17   Status New     PT SHORT TERM GOAL #2   Title Pt will improve DGI score to >/=18/24 to decr. falls risk.    Status New     PT SHORT TERM GOAL #3   Title Pt will improve gait speed to >/=2.61f/sec. to safely amb. in the community, without AD.    Status New     PT SHORT TERM GOAL #4   Title Pt will amb. 400' over even terrain, IND, to improve functional mobility.    Status New           PT Long Term Goals - 03/01/17 1549      PT LONG TERM GOAL #1   Title Pt will verbalize understanding of falls risk prevention strategies to reduce risk of another falls. TARGET DATE FOR ALL LTGS: 04/26/17   Status New     PT LONG TERM GOAL #2   Title Pt will amb. 800' over even/uneven terrain (outdoors) with LRAD, at MOD I level to improve functional mobility.    Status New     PT LONG TERM GOAL #3   Title Pt will improve DGI score to >/=20/24 to  decr. falls risk.    Status New     PT LONG TERM GOAL #4   Title Pt will perform floor to standing txfs at MOD I level to improve safety during functional mobility.    Status New               Plan - 03/15/17 1448    Clinical Impression Statement Pt demonstrated understanding of HEP with min cues for technique; pt requires UE support for standing balance.  Dynamic gait training with head turns; pt requires min guard on even surface.   Rehab Potential Good  PT Frequency 2x / week   PT Duration 8 weeks   PT Treatment/Interventions ADLs/Self Care Home Management;Biofeedback;Electrical Stimulation;Cryotherapy;Neuromuscular re-education;Balance training;Therapeutic exercise;Manual techniques;Therapeutic activities;Functional mobility training;Stair training;Gait training;Orthotic Fit/Training;DME Instruction;Patient/family education;Vestibular   PT Next Visit Plan  floor to stand transfer ? ( this was not performed in 1st session as in plan), continue with high level balance-SLS, compliant surfaces   PT Home Exercise Plan balance and heel cord and hamstring stretching   Consulted and Agree with Plan of Care Patient      Patient will benefit from skilled therapeutic intervention in order to improve the following deficits and impairments:  Abnormal gait, Decreased endurance, Decreased knowledge of use of DME, Increased edema, Postural dysfunction, Impaired flexibility, Decreased range of motion, Decreased balance, Decreased mobility, Decreased strength  Visit Diagnosis: Other abnormalities of gait and mobility  Muscle weakness (generalized)  Other disturbances of skin sensation     Problem List Patient Active Problem List   Diagnosis Date Noted  . Primary osteoarthritis of both hands 03/08/2017  . High risk medication use 03/07/2017  . Facial laceration 02/07/2017  . Poor balance 02/07/2017  . Diabetes mellitus ([place]) 01/11/2017  . Subarachnoid bleed ([place]) 01/11/2017  .  Painful orthopaedic hardware ([place]) 01/05/2017  . Psoriatic arthropathy ([place]) 09/08/2016  . Hypertension 09/08/2016  . Diabetic neuropathy ([place]) 09/08/2016  . LADA (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), managed as type 1 ([place]) 05/04/2016  . Syncope 03/25/2016  . Malnutrition of moderate degree 02/29/2016  . Leukocytosis 02/17/2016  . Diabetic ketoacidosis with coma associated with diabetes mellitus due to underlying condition ([place])   . Abnormal finding on GI tract imaging   . Hematochezia   . RLS (restless legs syndrome) 12/11/2015  . Liver dysfunction 08/11/2015  . Lumbago 05/06/2015  . Hot flashes 04/14/2015  . Peripheral edema 04/14/2015  . Psoriasis 04/14/2015  . Encounter for antineoplastic immunotherapy 04/06/2015  . Emphysema of lung ([place]) 11/26/2014  . Radiation-induced esophageal stricture 10/01/2014  . Tachycardia 08/27/2014  . Depression 08/27/2014  . Palliative care encounter 08/21/2014  . DNR (do not resuscitate) discussion 08/21/2014  . Neuropathic pain 08/21/2014  . Cancer associated pain 07/29/2014  . Malignant cachexia ([place]) 07/29/2014  . Lung collapse 07/29/2014  . Fatigue 07/18/2014  . Neuropathy due to chemotherapeutic drug ([place]) 07/18/2014  . Leg cramps 07/18/2014  . Hypoalbuminemia 07/18/2014  . Thrombocytopenia, unspecified ([place]) 07/18/2014  . Dyspnea 04/28/2014  . Radiation esophagitis 04/10/2014  . Right-sided chest wall pain 04/01/2014  . Cancer of middle lobe of lung ([place]) 01/17/2014  . Malignant neoplasm of right upper lobe of lung ([place]) 11/28/2013  . Lung mass 11/09/2013    Megan [name], Megan [name]  03/15/17, 3:49 PM [facility] [address] [place], [place], [address] Phone: [phone]   Fax:  [phone]  Name: Megan [name] MRN: [id number] Date of Birth: Mar 11, 1941

## 2017-03-15 NOTE — Patient Instructions (Signed)
   Place feet shoulder width apart.. Lean chest forward. Raise hips and straighten knees to stand. _10__ reps per set, _1_ sets per day, _1-2__ days per week Hold onto a support.  Copyright  VHI. All rights reserved.   Standing Marching   Using a chair if necessary, march in place. Repeat 10 times. Do 2 sessions per day.  http://gt2.exer.us/344   Copyright  VHI. All rights reserved.   Hip Backward Kick   Using a chair for balance, keep legs shoulder width apart and toes pointed for- ward. Slowly extend one leg back, keeping knee straight. Do not lean forward. Repeat with other leg. Repeat 10 times. Do  1-2 sessions per day.  http://gt2.exer.us/340   Copyright  VHI. All rights reserved.     Hip Side Kick   Holding a chair for balance, keep legs shoulder width apart and toes pointed forward. Swing a leg out to side, keeping knee straight. Do not lean. Repeat using other leg. Repeat 10 times. Do 1-2 sessions per day.  http://gt2.exer.us/342   Copyright  VHI. All rights reserved.        Standing On One Leg Without Support .  Stand on one leg in neutral spine without support. Hold 10 seconds. Repeat on other leg. Do 2 repetitions, 1 sets.  http://bt.exer.us/36   Copyright  VHI. All rights reserved.            Braiding   Move to side: 1) cross right leg in front of left, 2) bring back leg out to side, then 3) cross right leg behind left, 4) bring left leg out to side. Continue sequence in same direction. Reverse sequence, moving in opposite direction. Repeat sequence 2 times per session. Do 1 sessions per day.   Copyright  VHI. All rights reserved.

## 2017-03-17 ENCOUNTER — Ambulatory Visit: Payer: Medicare Other

## 2017-03-17 DIAGNOSIS — R2689 Other abnormalities of gait and mobility: Secondary | ICD-10-CM

## 2017-03-17 DIAGNOSIS — M6281 Muscle weakness (generalized): Secondary | ICD-10-CM

## 2017-03-17 NOTE — Therapy (Signed)
[facility] [address] [place], [place], [address] Phone: [phone]   Fax:  [phone]  Physical Therapy Treatment  Patient Details  Name: Megan [name] MRN: [id number] Date of Birth: 08/20/1941 Referring Provider: Dr. Quay Burow  Encounter Date: 03/17/2017      PT End of Session - 03/17/17 1537    Visit Number 5   Number of Visits 17   Date for PT Re-Evaluation 04/30/17   Authorization Type Medicare: G-CODE AND PROGRESS NOTE EVERY 10TH VISIT.   PT Start Time 1447   PT Stop Time 1528   PT Time Calculation (min) 41 min   Equipment Utilized During Treatment Gait belt   Activity Tolerance Patient tolerated treatment well   Behavior During Therapy WFL for tasks assessed/performed      Past Medical History:  Diagnosis Date  . Cancer ([place]) dx'd 09/2013   Lung ca  . Diabetes ([place]) 09/08/2016   type I diabetic patient reported on 09/13/16   . Diabetes mellitus    insulin  . Diabetic neuropathy ([place]) 09/08/2016  . GERD (gastroesophageal reflux disease)    no meds for  . History of hiatal hernia   . Hx of radiation therapy 12/16/13-01/30/14   lung 66Gy  . Hypertension   . Malignant neoplasm of right upper lobe of lung ([place]) 11/28/2013  . Neuropathy   . Psoriasis   . Psoriatic arthritis ([place]) 09/08/2016  . Rheumatoid arthritis ([place])   . Shortness of breath dyspnea    with exertion    Past Surgical History:  Procedure Laterality Date  . ABDOMINAL HYSTERECTOMY    . ABDOMINAL SURGERY    . BALLOON DILATION N/A 12/12/2014   Procedure: BALLOON DILATION;  Surgeon: Inda Castle, MD;  Location: [facility];  Service: Endoscopy;  Laterality: N/A;  . CHOLECYSTECTOMY    . COLONOSCOPY N/A 02/07/2016   Procedure: COLONOSCOPY;  Surgeon: Doran Stabler, MD;  Location: [facility] ENDOSCOPY;  Service: Endoscopy;  Laterality: N/A;  . DILATION AND CURETTAGE OF UTERUS    . ESOPHAGOGASTRODUODENOSCOPY N/A 12/12/2014   Procedure:  ESOPHAGOGASTRODUODENOSCOPY (EGD);  Surgeon: Inda Castle, MD;  Location: [facility];  Service: Endoscopy;  Laterality: N/A;  with dilation  . ESOPHAGOGASTRODUODENOSCOPY (EGD) WITH PROPOFOL N/A 10/09/2014   Procedure: ESOPHAGOGASTRODUODENOSCOPY (EGD) WITH PROPOFOL;  Surgeon: Inda Castle, MD;  Location: [facility];  Service: Endoscopy;  Laterality: N/A;  . ESOPHAGOGASTRODUODENOSCOPY (EGD) WITH PROPOFOL N/A 09/19/2016   Procedure: ESOPHAGOGASTRODUODENOSCOPY (EGD) WITH PROPOFOL;  Surgeon: Doran Stabler, MD;  Location: [facility];  Service: Gastroenterology;  Laterality: N/A;  with savary dil tt  . FRACTURE SURGERY    . ORIF ANKLE FRACTURE Right 07/04/2015   Procedure: OPEN REDUCTION INTERNAL FIXATION (ORIF) ANKLE FRACTURE;  Surgeon: Newt Minion, MD;  Location: [facility];  Service: Orthopedics;  Laterality: Right;  OPEN REDUCTION, INTERNAL FIXATION OF RIGHT ANKLE FRACTURE.   [name] ORIF ANKLE FRACTURE Left 02/10/2016   Procedure: OPEN REDUCTION INTERNAL FIXATION (ORIF) ANKLE FRACTURE;  Surgeon: Newt Minion, MD;  Location: [facility];  Service: Orthopedics;  Laterality: Left;  . SAVORY DILATION N/A 10/09/2014   Procedure: SAVORY DILATION;  Surgeon: Inda Castle, MD;  Location: [facility];  Service: Endoscopy;  Laterality: N/A;  . SAVORY DILATION N/A 09/19/2016   Procedure: SAVORY DILATION;  Surgeon: Doran Stabler, MD;  Location: [facility];  Service: Gastroenterology;  Laterality: N/A;  . TONSILLECTOMY      There were no vitals filed for  this visit.      Subjective Assessment - 03/17/17 1450    Subjective Pt denied falls since last visit. Pt reported her urologist told pt to take OTC AZO at the first sign of UTI, pt reports it has helped stop UTI and with urinary incontinence. Pt reports her hips have hurt after last two sessions.    Pertinent History HTN, active R lung CA (pt reports it is now "dormant" not in remission), emphysema, neuropathy in B hands/feet, RA, hx of B ankle  fracture s/p ORIFs, hx of metatarsal fx's    Patient Stated Goals Improve awareness when walking, improve balance, be able to monitor her feet better   Currently in Pain? No/denies        Therex: Pt performed therex in hooklying, sidelying, and seated positions, please see pt instructions for HEP details. No c/o pain noted during therex. -Hooklying and seated: piriformis stretch 2x30sec./LE with cues for technique. Pt performed seated stretch, added to HEP. -Sidelying clamshells: 2x10reps/LE, cues for technique.                 [facility] Adult PT Treatment/Exercise - 03/17/17 1500      Transfers   Transfers Floor to Transfer   Floor to Transfer 4: Min guard;With upper extremity assist;5: Supervision   Floor to Transfer Details (indicate cue type and reason) Pt performed    Floor to Transfer Details Tactile cues for initiation;Tactile cues for sequencing;Tactile cues for weight shifting;Tactile cues for placement;Verbal cues for sequencing;Verbal cues for technique;Verbal cues for precautions/safety     High Level Balance   High Level Balance Activities Side stepping;Marching forwards;Other (comment)   High Level Balance Comments Pt performed sidestepping and marches on compliant surfaces: 4x7' in // bars with 0-2 UE support and cues and demo for safety and technique. Pt performed B SLS with 10 sec. holds x3 reps/LE with pt progressing to no UE support. Cues for technique.                 PT Education - 03/17/17 1536    Education provided Yes   Education Details PT provided pt with seated piriformis stretch and modified hip abd. to clamshells vs. standing hip abduction due to pt c/o pain after performing standing hip abd.    Person(s) Educated Patient   Methods Explanation;Demonstration;Verbal cues;Handout   Comprehension Returned demonstration;Verbalized understanding          PT Short Term Goals - 03/01/17 1548      PT SHORT TERM GOAL #1   Title Pt will be  IND in HEP to improve balance, strength, and flexibility. TARGET DATE FOR ALL STGS: 03/29/17   Status New     PT SHORT TERM GOAL #2   Title Pt will improve DGI score to >/=18/24 to decr. falls risk.    Status New     PT SHORT TERM GOAL #3   Title Pt will improve gait speed to >/=2.39f/sec. to safely amb. in the community, without AD.    Status New     PT SHORT TERM GOAL #4   Title Pt will amb. 400' over even terrain, IND, to improve functional mobility.    Status New           PT Long Term Goals - 03/01/17 1549      PT LONG TERM GOAL #1   Title Pt will verbalize understanding of falls risk prevention strategies to reduce risk of another falls. TARGET DATE FOR ALL LTGS: 04/26/17  Status New     PT LONG TERM GOAL #2   Title Pt will amb. 800' over even/uneven terrain (outdoors) with LRAD, at MOD I level to improve functional mobility.    Status New     PT LONG TERM GOAL #3   Title Pt will improve DGI score to >/=20/24 to decr. falls risk.    Status New     PT LONG TERM GOAL #4   Title Pt will perform floor to standing txfs at MOD I level to improve safety during functional mobility.    Status New               Plan - 03/17/17 1537    Clinical Impression Statement Pt demonstrated progress as she was able to perform floor txfs with min guard to S. Pt tolerated sidelying clamshell strengthening vs. standing hip abd (which pt c/o soreness afterwards). Pt continues to experience LOB and incr. postural sway during SLS activities without UE support and would continue to benefit from skilled PT to improve safety during functional mobility.    Rehab Potential Good   PT Frequency 2x / week   PT Duration 8 weeks   PT Treatment/Interventions ADLs/Self Care Home Management;Biofeedback;Electrical Stimulation;Cryotherapy;Neuromuscular re-education;Balance training;Therapeutic exercise;Manual techniques;Therapeutic activities;Functional mobility training;Stair training;Gait  training;Orthotic Fit/Training;DME Instruction;Patient/family education;Vestibular   PT Next Visit Plan continue with high level balance-SLS, compliant surfaces   PT Home Exercise Plan balance and heel cord and hamstring stretching   Consulted and Agree with Plan of Care Patient      Patient will benefit from skilled therapeutic intervention in order to improve the following deficits and impairments:  Abnormal gait, Decreased endurance, Decreased knowledge of use of DME, Increased edema, Postural dysfunction, Impaired flexibility, Decreased range of motion, Decreased balance, Decreased mobility, Decreased strength  Visit Diagnosis: Other abnormalities of gait and mobility  Muscle weakness (generalized)     Problem List Patient Active Problem List   Diagnosis Date Noted  . Primary osteoarthritis of both hands 03/08/2017  . High risk medication use 03/07/2017  . Facial laceration 02/07/2017  . Poor balance 02/07/2017  . Diabetes mellitus ([place]) 01/11/2017  . Subarachnoid bleed ([place]) 01/11/2017  . Painful orthopaedic hardware ([place]) 01/05/2017  . Psoriatic arthropathy ([place]) 09/08/2016  . Hypertension 09/08/2016  . Diabetic neuropathy ([place]) 09/08/2016  . LADA (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), managed as type 1 ([place]) 05/04/2016  . Syncope 03/25/2016  . Malnutrition of moderate degree 02/29/2016  . Leukocytosis 02/17/2016  . Diabetic ketoacidosis with coma associated with diabetes mellitus due to underlying condition ([place])   . Abnormal finding on GI tract imaging   . Hematochezia   . RLS (restless legs syndrome) 12/11/2015  . Liver dysfunction 08/11/2015  . Lumbago 05/06/2015  . Hot flashes 04/14/2015  . Peripheral edema 04/14/2015  . Psoriasis 04/14/2015  . Encounter for antineoplastic immunotherapy 04/06/2015  . Emphysema of lung ([place]) 11/26/2014  . Radiation-induced esophageal stricture 10/01/2014  . Tachycardia 08/27/2014  . Depression 08/27/2014  . Palliative care  encounter 08/21/2014  . DNR (do not resuscitate) discussion 08/21/2014  . Neuropathic pain 08/21/2014  . Cancer associated pain 07/29/2014  . Malignant cachexia ([place]) 07/29/2014  . Lung collapse 07/29/2014  . Fatigue 07/18/2014  . Neuropathy due to chemotherapeutic drug ([place]) 07/18/2014  . Leg cramps 07/18/2014  . Hypoalbuminemia 07/18/2014  . Thrombocytopenia, unspecified ([place]) 07/18/2014  . Dyspnea 04/28/2014  . Radiation esophagitis 04/10/2014  . Right-sided chest wall pain 04/01/2014  . Cancer of middle lobe of lung (  [place]) 01/17/2014  . Malignant neoplasm of right upper lobe of lung ([place]) 11/28/2013  . Lung mass 11/09/2013    Skylan Lara L 03/17/2017, 4:30 PM  [facility] [address] [place], [place], [address] Phone: [phone]   Fax:  [phone]  Name: Megan [name] MRN: [id number] Date of Birth: [birth year]  Geoffry Paradise, PT,DPT 03/17/17 4:32 PM Phone: [phone] Fax: [phone]

## 2017-03-17 NOTE — Patient Instructions (Addendum)
Abduction: Clam (Eccentric) - Side-Lying    Lie on side with knees bent. Lift top knee, keeping feet together. Keep trunk steady, with hips forward. Slowly lower leg. _10__ reps per set, _2__ sets per day, _3__ days per week. Perform with both legs.  http://ecce.exer.us/65   Copyright  VHI. All rights reserved.    Piriformis Stretch, Sitting    Sit, one ankle on opposite knee, same-side hand on crossed knee. Push down on knee, keeping spine straight. Lean torso forward, with flat back, until tension is felt in hamstrings and gluteals of crossed-leg side. Hold _30__ seconds. Repeat on the other side and hold for 30 seconds. Repeat __3_ times per session per leg. Do _2-3__ sessions per day.  Copyright  VHI. All rights reserved.

## 2017-03-21 ENCOUNTER — Other Ambulatory Visit: Payer: Self-pay | Admitting: Internal Medicine

## 2017-03-21 ENCOUNTER — Ambulatory Visit: Payer: Medicare Other

## 2017-03-22 ENCOUNTER — Inpatient Hospital Stay (HOSPITAL_COMMUNITY)
Admission: EM | Admit: 2017-03-22 | Discharge: 2017-03-25 | DRG: 871 | Disposition: A | Discharge status: Home / Self Care | Payer: Medicare Other | Attending: Internal Medicine | Admitting: Internal Medicine

## 2017-03-22 ENCOUNTER — Encounter (HOSPITAL_COMMUNITY): Payer: Self-pay

## 2017-03-22 ENCOUNTER — Emergency Department (HOSPITAL_COMMUNITY): Payer: Medicare Other

## 2017-03-22 ENCOUNTER — Ambulatory Visit: Payer: Medicare Other

## 2017-03-22 DIAGNOSIS — N10 Acute tubulo-interstitial nephritis: Secondary | ICD-10-CM | POA: Diagnosis not present

## 2017-03-22 DIAGNOSIS — F329 Major depressive disorder, single episode, unspecified: Secondary | ICD-10-CM | POA: Diagnosis present

## 2017-03-22 DIAGNOSIS — E876 Hypokalemia: Secondary | ICD-10-CM | POA: Diagnosis present

## 2017-03-22 DIAGNOSIS — E871 Hypo-osmolality and hyponatremia: Secondary | ICD-10-CM | POA: Diagnosis present

## 2017-03-22 DIAGNOSIS — E11649 Type 2 diabetes mellitus with hypoglycemia without coma: Secondary | ICD-10-CM | POA: Diagnosis not present

## 2017-03-22 DIAGNOSIS — Z79899 Other long term (current) drug therapy: Secondary | ICD-10-CM

## 2017-03-22 DIAGNOSIS — Z888 Allergy status to other drugs, medicaments and biological substances status: Secondary | ICD-10-CM

## 2017-03-22 DIAGNOSIS — I152 Hypertension secondary to endocrine disorders: Secondary | ICD-10-CM | POA: Diagnosis present

## 2017-03-22 DIAGNOSIS — L4050 Arthropathic psoriasis, unspecified: Secondary | ICD-10-CM | POA: Diagnosis not present

## 2017-03-22 DIAGNOSIS — Z923 Personal history of irradiation: Secondary | ICD-10-CM

## 2017-03-22 DIAGNOSIS — N3000 Acute cystitis without hematuria: Secondary | ICD-10-CM | POA: Diagnosis present

## 2017-03-22 DIAGNOSIS — Z85118 Personal history of other malignant neoplasm of bronchus and lung: Secondary | ICD-10-CM

## 2017-03-22 DIAGNOSIS — R4182 Altered mental status, unspecified: Secondary | ICD-10-CM | POA: Diagnosis present

## 2017-03-22 DIAGNOSIS — N12 Tubulo-interstitial nephritis, not specified as acute or chronic: Secondary | ICD-10-CM

## 2017-03-22 DIAGNOSIS — R410 Disorientation, unspecified: Secondary | ICD-10-CM

## 2017-03-22 DIAGNOSIS — Z794 Long term (current) use of insulin: Secondary | ICD-10-CM

## 2017-03-22 DIAGNOSIS — Z79891 Long term (current) use of opiate analgesic: Secondary | ICD-10-CM

## 2017-03-22 DIAGNOSIS — Z792 Long term (current) use of antibiotics: Secondary | ICD-10-CM

## 2017-03-22 DIAGNOSIS — C3411 Malignant neoplasm of upper lobe, right bronchus or lung: Secondary | ICD-10-CM | POA: Diagnosis present

## 2017-03-22 DIAGNOSIS — J439 Emphysema, unspecified: Secondary | ICD-10-CM | POA: Diagnosis present

## 2017-03-22 DIAGNOSIS — E1142 Type 2 diabetes mellitus with diabetic polyneuropathy: Secondary | ICD-10-CM | POA: Diagnosis not present

## 2017-03-22 DIAGNOSIS — M19042 Primary osteoarthritis, left hand: Secondary | ICD-10-CM | POA: Diagnosis present

## 2017-03-22 DIAGNOSIS — C342 Malignant neoplasm of middle lobe, bronchus or lung: Secondary | ICD-10-CM | POA: Diagnosis present

## 2017-03-22 DIAGNOSIS — Z7951 Long term (current) use of inhaled steroids: Secondary | ICD-10-CM

## 2017-03-22 DIAGNOSIS — Z90710 Acquired absence of both cervix and uterus: Secondary | ICD-10-CM

## 2017-03-22 DIAGNOSIS — I10 Essential (primary) hypertension: Secondary | ICD-10-CM

## 2017-03-22 DIAGNOSIS — D696 Thrombocytopenia, unspecified: Secondary | ICD-10-CM | POA: Diagnosis not present

## 2017-03-22 DIAGNOSIS — K219 Gastro-esophageal reflux disease without esophagitis: Secondary | ICD-10-CM | POA: Diagnosis present

## 2017-03-22 DIAGNOSIS — A4151 Sepsis due to Escherichia coli [E. coli]: Secondary | ICD-10-CM | POA: Diagnosis not present

## 2017-03-22 DIAGNOSIS — A419 Sepsis, unspecified organism: Secondary | ICD-10-CM

## 2017-03-22 DIAGNOSIS — M19041 Primary osteoarthritis, right hand: Secondary | ICD-10-CM | POA: Diagnosis present

## 2017-03-22 DIAGNOSIS — Z885 Allergy status to narcotic agent status: Secondary | ICD-10-CM

## 2017-03-22 DIAGNOSIS — M069 Rheumatoid arthritis, unspecified: Secondary | ICD-10-CM | POA: Diagnosis present

## 2017-03-22 DIAGNOSIS — Z87891 Personal history of nicotine dependence: Secondary | ICD-10-CM

## 2017-03-22 DIAGNOSIS — G9341 Metabolic encephalopathy: Secondary | ICD-10-CM | POA: Diagnosis present

## 2017-03-22 DIAGNOSIS — Z1639 Resistance to other specified antimicrobial drug: Secondary | ICD-10-CM | POA: Diagnosis not present

## 2017-03-22 DIAGNOSIS — E1165 Type 2 diabetes mellitus with hyperglycemia: Secondary | ICD-10-CM | POA: Diagnosis present

## 2017-03-22 LAB — CBC WITH DIFFERENTIAL/PLATELET
BASOS ABS: 0 10*3/uL (ref 0.0–0.1)
BASOS PCT: 0 %
Eosinophils Absolute: 0 10*3/uL (ref 0.0–0.7)
Eosinophils Relative: 0 %
HEMATOCRIT: 33.8 % — AB (ref 36.0–46.0)
Hemoglobin: 10.8 g/dL — ABNORMAL LOW (ref 12.0–15.0)
LYMPHS PCT: 8 %
Lymphs Abs: 0.6 10*3/uL — ABNORMAL LOW (ref 0.7–4.0)
MCH: 28.2 pg (ref 26.0–34.0)
MCHC: 32 g/dL (ref 30.0–36.0)
MCV: 88.3 fL (ref 78.0–100.0)
MONO ABS: 0.7 10*3/uL (ref 0.1–1.0)
Monocytes Relative: 9 %
NEUTROS ABS: 6.4 10*3/uL (ref 1.7–7.7)
NEUTROS PCT: 83 %
Platelets: 92 10*3/uL — ABNORMAL LOW (ref 150–400)
RBC: 3.83 MIL/uL — AB (ref 3.87–5.11)
RDW: 17.3 % — AB (ref 11.5–15.5)
WBC: 7.8 10*3/uL (ref 4.0–10.5)

## 2017-03-22 LAB — COMPREHENSIVE METABOLIC PANEL
ALT: 27 U/L (ref 14–54)
ANION GAP: 11 (ref 5–15)
AST: 32 U/L (ref 15–41)
Albumin: 2.8 g/dL — ABNORMAL LOW (ref 3.5–5.0)
Alkaline Phosphatase: 154 U/L — ABNORMAL HIGH (ref 38–126)
BILIRUBIN TOTAL: 1.5 mg/dL — AB (ref 0.3–1.2)
BUN: 11 mg/dL (ref 6–20)
CHLORIDE: 100 mmol/L — AB (ref 101–111)
CO2: 21 mmol/L — ABNORMAL LOW (ref 22–32)
Calcium: 7.7 mg/dL — ABNORMAL LOW (ref 8.9–10.3)
Creatinine, Ser: 0.93 mg/dL (ref 0.44–1.00)
GFR, EST NON AFRICAN AMERICAN: 59 mL/min — AB (ref >60)
Glucose, Bld: 257 mg/dL — ABNORMAL HIGH (ref 65–99)
POTASSIUM: 3.1 mmol/L — AB (ref 3.5–5.1)
Sodium: 132 mmol/L — ABNORMAL LOW (ref 135–145)
TOTAL PROTEIN: 5.7 g/dL — AB (ref 6.5–8.1)

## 2017-03-22 LAB — URINALYSIS, ROUTINE W REFLEX MICROSCOPIC
Bilirubin Urine: NEGATIVE
Ketones, ur: 80 mg/dL — AB
NITRITE: POSITIVE — AB
PH: 6 (ref 5.0–8.0)
Protein, ur: 100 mg/dL — AB
Specific Gravity, Urine: 1.014 (ref 1.005–1.030)

## 2017-03-22 LAB — GLUCOSE, CAPILLARY: Glucose-Capillary: 333 mg/dL — ABNORMAL HIGH (ref 65–99)

## 2017-03-22 LAB — PROTIME-INR
INR: 1.11
Prothrombin Time: 14.4 seconds (ref 11.4–15.2)

## 2017-03-22 LAB — I-STAT CG4 LACTIC ACID, ED
LACTIC ACID, VENOUS: 0.8 mmol/L (ref 0.5–1.9)
LACTIC ACID, VENOUS: 0.57 mmol/L (ref 0.5–1.9)

## 2017-03-22 LAB — MAGNESIUM: MAGNESIUM: 1.8 mg/dL (ref 1.7–2.4)

## 2017-03-22 MED ORDER — DULOXETINE HCL 60 MG PO CPEP
60.0000 mg | ORAL_CAPSULE | Freq: Two times a day (BID) | ORAL | Status: DC
  Administered 2017-03-23 – 2017-03-25 (×6): 60 mg via ORAL
  Filled 2017-03-22 (×6): qty 1

## 2017-03-22 MED ORDER — INSULIN DETEMIR 100 UNIT/ML ~~LOC~~ SOLN
30.0000 [IU] | Freq: Every day | SUBCUTANEOUS | Status: DC
  Administered 2017-03-23 (×2): 30 [IU] via SUBCUTANEOUS
  Filled 2017-03-22 (×2): qty 0.3

## 2017-03-22 MED ORDER — IBUPROFEN 400 MG PO TABS
600.0000 mg | ORAL_TABLET | Freq: Once | ORAL | Status: AC
  Administered 2017-03-22: 600 mg via ORAL
  Filled 2017-03-22: qty 1

## 2017-03-22 MED ORDER — PREGABALIN 75 MG PO CAPS
150.0000 mg | ORAL_CAPSULE | Freq: Two times a day (BID) | ORAL | Status: DC
  Administered 2017-03-23 – 2017-03-25 (×6): 150 mg via ORAL
  Filled 2017-03-22 (×6): qty 2

## 2017-03-22 MED ORDER — INSULIN DETEMIR 100 UNIT/ML FLEXPEN: 15.0000 [IU] | PEN_INJECTOR | Freq: Every day | SUBCUTANEOUS | Status: DC

## 2017-03-22 MED ORDER — SULFASALAZINE 500 MG PO TBEC
1000.0000 mg | DELAYED_RELEASE_TABLET | Freq: Two times a day (BID) | ORAL | Status: DC
  Administered 2017-03-23 – 2017-03-25 (×6): 1000 mg via ORAL
  Filled 2017-03-22 (×6): qty 2

## 2017-03-22 MED ORDER — POTASSIUM CHLORIDE IN NACL 20-0.9 MEQ/L-% IV SOLN
INTRAVENOUS | Status: DC
  Administered 2017-03-22: 21:00:00 via INTRAVENOUS
  Filled 2017-03-22 (×2): qty 1000

## 2017-03-22 MED ORDER — ONDANSETRON HCL 4 MG PO TABS: 4.0000 mg | ORAL_TABLET | Freq: Four times a day (QID) | ORAL | Status: DC | PRN

## 2017-03-22 MED ORDER — ACETAMINOPHEN 500 MG PO TABS
1000.0000 mg | ORAL_TABLET | Freq: Once | ORAL | Status: AC
  Administered 2017-03-22: 1000 mg via ORAL
  Filled 2017-03-22: qty 2

## 2017-03-22 MED ORDER — ACETAMINOPHEN 650 MG RE SUPP: 650.0000 mg | Freq: Four times a day (QID) | RECTAL | Status: DC | PRN

## 2017-03-22 MED ORDER — DEXTROSE 5 % IV SOLN
2.0000 g | Freq: Once | INTRAVENOUS | Status: AC
  Administered 2017-03-22: 2 g via INTRAVENOUS
  Filled 2017-03-22: qty 2

## 2017-03-22 MED ORDER — INSULIN ASPART 100 UNIT/ML ~~LOC~~ SOLN
0.0000 [IU] | Freq: Every day | SUBCUTANEOUS | Status: DC
  Administered 2017-03-22: 4 [IU] via SUBCUTANEOUS
  Administered 2017-03-24: 2 [IU] via SUBCUTANEOUS

## 2017-03-22 MED ORDER — MOMETASONE FURO-FORMOTEROL FUM 200-5 MCG/ACT IN AERO
2.0000 | INHALATION_SPRAY | Freq: Two times a day (BID) | RESPIRATORY_TRACT | Status: DC
  Administered 2017-03-23 – 2017-03-25 (×6): 2 via RESPIRATORY_TRACT
  Filled 2017-03-22: qty 8.8

## 2017-03-22 MED ORDER — SODIUM CHLORIDE 0.9 % IV BOLUS (SEPSIS)
1000.0000 mL | Freq: Once | INTRAVENOUS | Status: AC
  Administered 2017-03-22: 1000 mL via INTRAVENOUS

## 2017-03-22 MED ORDER — DEXTROSE 5 % IV SOLN: 1.0000 g | INTRAVENOUS | Status: DC

## 2017-03-22 MED ORDER — ONDANSETRON HCL 4 MG/2ML IJ SOLN: 4.0000 mg | Freq: Four times a day (QID) | INTRAMUSCULAR | Status: DC | PRN

## 2017-03-22 MED ORDER — ACETAMINOPHEN 325 MG PO TABS: 650.0000 mg | ORAL_TABLET | Freq: Four times a day (QID) | ORAL | Status: DC | PRN

## 2017-03-22 MED ORDER — INSULIN ASPART 100 UNIT/ML ~~LOC~~ SOLN
0.0000 [IU] | Freq: Three times a day (TID) | SUBCUTANEOUS | Status: DC
  Administered 2017-03-23: 5 [IU] via SUBCUTANEOUS
  Administered 2017-03-23 (×2): 3 [IU] via SUBCUTANEOUS
  Administered 2017-03-24: 5 [IU] via SUBCUTANEOUS
  Administered 2017-03-25 (×2): 3 [IU] via SUBCUTANEOUS

## 2017-03-22 NOTE — Progress Notes (Signed)
Pharmacy Antibiotic Note  Megan [name] is a 76 y.o. female admitted on 03/22/2017 with pyelonephritis.  Pharmacy has been consulted for ceftriaxone dosing.  Got 2g of ceftriaxone at 1931 today  Plan: - ceftriaxone 1 g iv q24h, 1st dose tomorrow at 1900    Temp (24hrs), Avg:100.7 F (38.2 C), Min:98.4 F (36.9 C), Max:102.6 F (39.2 C)   Recent Labs Lab 03/22/17 1745 03/22/17 1757 03/22/17 1954  WBC 7.8  --   --   CREATININE 0.93  --   --   LATICACIDVEN  --  0.80 0.57    CrCl cannot be calculated (Unknown ideal weight.).    Allergies  Allergen Reactions  . Carboplatin Other (See Comments)    Neuropathy  . Remicade [Infliximab] Anaphylaxis  . Hydromorphone Rash    Thank you for allowing pharmacy to be a part of this patient's care.  Terrius Gentile, Tsz-Yin 03/22/2017 9:06 PM

## 2017-03-22 NOTE — ED Notes (Signed)
Patient taken to xray.

## 2017-03-22 NOTE — ED Provider Notes (Signed)
[facility] DEPT Provider Note   CSN: [id number] Arrival date & time: 03/22/17  1708     History   Chief Complaint Chief Complaint  Patient presents with  . Altered Mental Status    HPI Megan [name] is a 76 y.o. female.  Pt presents to the ED with mental status changes.  Pt is a poor historian as she is somewhat confused.  She said that she called EMS, but is unable to tell me why.  She denies any pain.      Past Medical History:  Diagnosis Date  . Cancer ([place]) dx'd 09/2013   Lung ca  . Diabetes ([place]) 09/08/2016   type I diabetic patient reported on 09/13/16   . Diabetes mellitus    insulin  . Diabetic neuropathy ([place]) 09/08/2016  . GERD (gastroesophageal reflux disease)    no meds for  . History of hiatal hernia   . Hx of radiation therapy 12/16/13-01/30/14   lung 66Gy  . Hypertension   . Malignant neoplasm of right upper lobe of lung ([place]) 11/28/2013  . Neuropathy   . Psoriasis   . Psoriatic arthritis ([place]) 09/08/2016  . Rheumatoid arthritis ([place])   . Shortness of breath dyspnea    with exertion    Patient Active Problem List   Diagnosis Date Noted  . Altered mental status 03/22/2017  . Primary osteoarthritis of both hands 03/08/2017  . High risk medication use 03/07/2017  . Facial laceration 02/07/2017  . Poor balance 02/07/2017  . Diabetes mellitus ([place]) 01/11/2017  . Subarachnoid bleed ([place]) 01/11/2017  . Painful orthopaedic hardware ([place]) 01/05/2017  . Psoriatic arthropathy ([place]) 09/08/2016  . Hypertension 09/08/2016  . Diabetic neuropathy ([place]) 09/08/2016  . LADA (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), managed as type 1 ([place]) 05/04/2016  . Syncope 03/25/2016  . Malnutrition of moderate degree 02/29/2016  . Leukocytosis 02/17/2016  . Diabetic ketoacidosis with coma associated with diabetes mellitus due to underlying condition ([place])   . Abnormal finding on GI tract imaging   . Hematochezia   . RLS (restless legs syndrome) 12/11/2015  .  Liver dysfunction 08/11/2015  . Lumbago 05/06/2015  . Hot flashes 04/14/2015  . Peripheral edema 04/14/2015  . Psoriasis 04/14/2015  . Encounter for antineoplastic immunotherapy 04/06/2015  . Emphysema of lung ([place]) 11/26/2014  . Radiation-induced esophageal stricture 10/01/2014  . Tachycardia 08/27/2014  . Depression 08/27/2014  . Palliative care encounter 08/21/2014  . DNR (do not resuscitate) discussion 08/21/2014  . Neuropathic pain 08/21/2014  . Cancer associated pain 07/29/2014  . Malignant cachexia ([place]) 07/29/2014  . Lung collapse 07/29/2014  . Fatigue 07/18/2014  . Neuropathy due to chemotherapeutic drug ([place]) 07/18/2014  . Leg cramps 07/18/2014  . Hypoalbuminemia 07/18/2014  . Thrombocytopenia, unspecified ([place]) 07/18/2014  . Dyspnea 04/28/2014  . Radiation esophagitis 04/10/2014  . Right-sided chest wall pain 04/01/2014  . Cancer of middle lobe of lung ([place]) 01/17/2014  . Malignant neoplasm of right upper lobe of lung ([place]) 11/28/2013  . Lung mass 11/09/2013    Past Surgical History:  Procedure Laterality Date  . ABDOMINAL HYSTERECTOMY    . ABDOMINAL SURGERY    . BALLOON DILATION N/A 12/12/2014   Procedure: BALLOON DILATION;  Surgeon: Inda Castle, MD;  Location: [facility];  Service: Endoscopy;  Laterality: N/A;  . CHOLECYSTECTOMY    . COLONOSCOPY N/A 02/07/2016   Procedure: COLONOSCOPY;  Surgeon: Doran Stabler, MD;  Location: [facility] ENDOSCOPY;  Service: Endoscopy;  Laterality: N/A;  . DILATION AND  CURETTAGE OF UTERUS    . ESOPHAGOGASTRODUODENOSCOPY N/A 12/12/2014   Procedure: ESOPHAGOGASTRODUODENOSCOPY (EGD);  Surgeon: Inda Castle, MD;  Location: [facility];  Service: Endoscopy;  Laterality: N/A;  with dilation  . ESOPHAGOGASTRODUODENOSCOPY (EGD) WITH PROPOFOL N/A 10/09/2014   Procedure: ESOPHAGOGASTRODUODENOSCOPY (EGD) WITH PROPOFOL;  Surgeon: Inda Castle, MD;  Location: [facility];  Service: Endoscopy;  Laterality: N/A;  .  ESOPHAGOGASTRODUODENOSCOPY (EGD) WITH PROPOFOL N/A 09/19/2016   Procedure: ESOPHAGOGASTRODUODENOSCOPY (EGD) WITH PROPOFOL;  Surgeon: Doran Stabler, MD;  Location: [facility] ENDOSCOPY;  Service: Gastroenterology;  Laterality: N/A;  with savary dil tt  . FRACTURE SURGERY    . ORIF ANKLE FRACTURE Right 07/04/2015   Procedure: OPEN REDUCTION INTERNAL FIXATION (ORIF) ANKLE FRACTURE;  Surgeon: Newt Minion, MD;  Location: [facility];  Service: Orthopedics;  Laterality: Right;  OPEN REDUCTION, INTERNAL FIXATION OF RIGHT ANKLE FRACTURE.   [name] ORIF ANKLE FRACTURE Left 02/10/2016   Procedure: OPEN REDUCTION INTERNAL FIXATION (ORIF) ANKLE FRACTURE;  Surgeon: Newt Minion, MD;  Location: [facility];  Service: Orthopedics;  Laterality: Left;  . SAVORY DILATION N/A 10/09/2014   Procedure: SAVORY DILATION;  Surgeon: Inda Castle, MD;  Location: [facility];  Service: Endoscopy;  Laterality: N/A;  . SAVORY DILATION N/A 09/19/2016   Procedure: SAVORY DILATION;  Surgeon: Doran Stabler, MD;  Location: [facility] ENDOSCOPY;  Service: Gastroenterology;  Laterality: N/A;  . TONSILLECTOMY      OB History    No data available       Home Medications    Prior to Admission medications   Medication Sig Start Date End Date Taking? Authorizing Provider  amLODipine (NORVASC) 2.5 MG tablet Take 1 tablet (2.5 mg total) by mouth daily. 02/07/17   Burns, [name], MD  APAP-Pamabrom-Pyrilamine (PAMPRIN MAX PAIN FORMULA PO) Take 1 tablet by mouth at bedtime as needed (sleep).    [provider]  augmented betamethasone dipropionate (DIPROLENE-AF) 0.05 % ointment APP AA BID PRN 02/24/17   [provider]  B-D UF III MINI PEN NEEDLES 31G X 5 MM MISC USE TO CHECK SUGAR FIVE TIMES DAILY 03/13/17   Philemon Kingdom, MD  DULoxetine (CYMBALTA) 60 MG capsule Take 1 capsule (60 mg total) by mouth 2 (two) times daily. 10/26/16   Melvenia Beam, MD  fluticasone-salmeterol (ADVAIR HFA) [phone] MCG/ACT inhaler Inhale 2 puffs into the  lungs 2 (two) times daily as needed.     [provider]  insulin lispro (HUMALOG KWIKPEN) 100 UNIT/ML KiwkPen INJECT 8-20 units 3x a day under skin Patient taking differently: Inject 8-20 Units into the skin 4 (four) times daily. Per patient sliding scale. 06/16/16   Philemon Kingdom, MD  LEVEMIR FLEXTOUCH 100 UNIT/ML Pen INJECT 30 UNITS INTO SKIN EVERY NIGHT AT BEDTIME 03/21/17   Philemon Kingdom, MD  neomycin-bacitracin-polymyxin (NEOSPORIN) [phone] ointment Apply topically 4 (four) times daily. Apply to  wounds at the right face 01/12/17   Elgergawy, Silver Huguenin, MD  [facility] DELICA LANCETS FINE MISC USE TO TEST BLOOD SUGAR FIVE TIMES DAILY AS NEEDED 07/27/16   Philemon Kingdom, MD  [facility] VERIO test strip Use 5x a day 10/14/16   Philemon Kingdom, MD  oxyCODONE-acetaminophen (PERCOCET/ROXICET) 5-325 MG tablet TAKE 1 TABLET BY MOUTH EVERY4-6 HOURS AS NEEDED FOR PAIN. 08/20/16   [provider]  pregabalin (LYRICA) 150 MG capsule Take 1 capsule (150 mg total) by mouth 2 (two) times daily. 10/26/16   Melvenia Beam, MD  saccharomyces boulardii (FLORASTOR) 250 MG capsule  Take 1 capsule (250 mg total) by mouth 2 (two) times daily. Patient not taking: Reported on 03/08/2017 01/12/17   Elgergawy, Silver Huguenin, MD  solifenacin (VESICARE) 5 MG tablet Take 5 mg by mouth at bedtime.     [provider]  sulfaSALAzine (AZULFIDINE) 500 MG EC tablet Take 2 tablets (1,000 mg total) by mouth 2 (two) times daily. 03/08/17   Bo Merino, MD    Family History Family History  Problem Relation Age of Onset  . Other Mother   . Other Father        failure to thrive  . Hypertension Other   . Stroke Other   . Heart attack Other   . Hemachromatosis Other   . Rheum arthritis Unknown        both sets of grandparents and father    Social History Social History  Substance Use Topics  . Smoking status: Former Smoker    Packs/day: 3.00    Years: 45.00    Types: Cigarettes    Quit  date: 12/05/1997  . Smokeless tobacco: Never Used  . Alcohol use No     Allergies   Carboplatin; Remicade [infliximab]; and Hydromorphone   Review of Systems Review of Systems  Unable to perform ROS: Mental status change  All other systems reviewed and are negative.    Physical Exam Updated Vital Signs BP 121/70   Pulse 97   Temp (!) 101.9 F (38.8 C) (Rectal)   Resp (!) 23   SpO2 92%   Physical Exam  Constitutional: She appears well-developed and well-nourished.  HENT:  Head: Normocephalic and atraumatic.  Right Ear: External ear normal.  Left Ear: External ear normal.  Nose: Nose normal.  Mouth/Throat: Mucous membranes are dry.  Eyes: Conjunctivae and EOM are normal. Pupils are equal, round, and reactive to light.  Neck: Normal range of motion. Neck supple.  Cardiovascular: Regular rhythm, normal heart sounds and intact distal pulses.  Tachycardia present.   Pulmonary/Chest: Effort normal and breath sounds normal.  Abdominal: Soft. Bowel sounds are normal.  Musculoskeletal: Normal range of motion.  Neurological: She is alert.  Oriented to person.  She knows she's at a hospital, but not sure which one.  Skin: Capillary refill takes less than 2 seconds.  Psychiatric: She has a normal mood and affect.  Nursing note and vitals reviewed.    ED Treatments / Results  Labs (all labs ordered are listed, but only abnormal results are displayed) Labs Reviewed  COMPREHENSIVE METABOLIC PANEL - Abnormal; Notable for the following:       Result Value   Sodium 132 (*)    Potassium 3.1 (*)    Chloride 100 (*)    CO2 21 (*)    Glucose, Bld 257 (*)    Calcium 7.7 (*)    Total Protein 5.7 (*)    Albumin 2.8 (*)    Alkaline Phosphatase 154 (*)    Total Bilirubin 1.5 (*)    GFR calc non Af Amer 59 (*)    All other components within normal limits  CBC WITH DIFFERENTIAL/PLATELET - Abnormal; Notable for the following:    RBC 3.83 (*)    Hemoglobin 10.8 (*)    HCT 33.8 (*)     RDW 17.3 (*)    Platelets 92 (*)    Lymphs Abs 0.6 (*)    All other components within normal limits  URINALYSIS, ROUTINE W REFLEX MICROSCOPIC - Abnormal; Notable for the following:    Color, Urine AMBER (*)  APPearance HAZY (*)    Glucose, UA >=500 (*)    Hgb urine dipstick SMALL (*)    Ketones, ur 80 (*)    Protein, ur 100 (*)    Nitrite POSITIVE (*)    Leukocytes, UA MODERATE (*)    Bacteria, UA MANY (*)    Squamous Epithelial / LPF 6-30 (*)    All other components within normal limits  CULTURE, BLOOD (ROUTINE X 2)  CULTURE, BLOOD (ROUTINE X 2)  PROTIME-INR  I-STAT CG4 LACTIC ACID, ED  I-STAT CG4 LACTIC ACID, ED    EKG  EKG Interpretation  Date/Time:  Wednesday Mar 22 2017 17:35:43 EDT Ventricular Rate:  107 PR Interval:    QRS Duration: 92 QT Interval:  346 QTC Calculation: 462 R Axis:   21 Text Interpretation:  Sinus tachycardia Borderline T abnormalities, lateral leads Confirmed by Isla Pence [phone]) on 03/22/2017 5:47:30 PM       Radiology Dg Chest 2 View  Result Date: 03/22/2017 CLINICAL DATA:  Short of breath EXAM: CHEST  2 VIEW COMPARISON:  02/02/2017 FINDINGS: Volume loss on the right as before. Left lung is clear. Stable enlarged cardiomediastinal silhouette. No pneumothorax. IMPRESSION: Volume loss on the right with shift of mediastinal contents to the right. This finding is unchanged. No definite acute interval change compared to prior. Electronically Signed   By: Donavan Foil M.D.   On: 03/22/2017 18:38    Procedures Procedures (including critical care time)  Medications Ordered in ED Medications  sodium chloride 0.9 % bolus 1,000 mL (0 mLs Intravenous Stopped 03/22/17 1855)    And  sodium chloride 0.9 % bolus 1,000 mL (1,000 mLs Intravenous New Bag/Given 03/22/17 1932)    And  sodium chloride 0.9 % bolus 1,000 mL (1,000 mLs Intravenous New Bag/Given 03/22/17 1856)  cefTRIAXone (ROCEPHIN) 2 g in dextrose 5 % 50 mL IVPB (2 g Intravenous New  Bag/Given 03/22/17 1931)  insulin aspart (novoLOG) injection 0-15 Units (not administered)  insulin aspart (novoLOG) injection 0-5 Units (not administered)  acetaminophen (TYLENOL) tablet 1,000 mg (1,000 mg Oral Given 03/22/17 1802)  ibuprofen (ADVIL,MOTRIN) tablet 600 mg (600 mg Oral Given 03/22/17 1931)     Initial Impression / Assessment and Plan / ED Course  I have reviewed the triage vital signs and the nursing notes.  Pertinent labs & imaging results that were available during my care of the patient were reviewed by me and considered in my medical decision making (see chart for details).   mental status has improved with IVFs and fever treatment.  Pt also given 2 g rocephin IV.  She was d/w Dr. Loleta Books (triad) for admission.  This patient meets SIRS Criteria and may be septic. SIRS = Systemic Inflammatory Response Syndrome  Best Practice Recommends:   Notify the nurse immediately to increase monitoring of patient.   The recent clinical data is shown below. Vitals:   03/22/17 1847 03/22/17 1900 03/22/17 1915 03/22/17 1930  BP:  125/73 125/65 121/70  Pulse:  100 (!) 103 97  Resp:  (!) 21 (!) 23 (!) 23  Temp: (!) 101.9 F (38.8 C)     TempSrc: Rectal     SpO2:  92% 90% 92%     Final Clinical Impressions(s) / ED Diagnoses   Final diagnoses:  Sepsis, due to unspecified organism [facility])  Acute cystitis without hematuria  Metabolic encephalopathy    New Prescriptions New Prescriptions   No medications on file     Isla Pence, MD 03/22/17 1941

## 2017-03-22 NOTE — H&P (Signed)
History and Physical  Patient Name: Megan [name]     IOE:[id number]    DOB: 08-22-1941    DOA: 03/22/2017 PCP: Binnie Rail, MD   Patient coming from: Home  Chief Complaint: Weakness, fever, confusion  HPI: Megan [name] is a 76 y.o. female with a past medical history significant for Psoriatic arthritis on sulfasalazine, HTN, IDDM, recent SAH, emphysema and lung CA stage IIIB stable not on chemo, and thrombocytopenia who presents with fever.  The patient was in her usual state of health until about 3 days ago when she started to "ricochet", by which she means she felt very weak, when she got up she had to hold onto the wall to walk, and she felt unsteady. Her caregiver has seen her like this many times before, and so they didn't think much of it, but over the next 2 days, she felt progressive weight worse, with shakes, diaphoresis, dry heaves.  She is a somewhat altered historian, complains of probably chronic cough no recent change, right foot pain since her hardware removal a month ago, and also urinary frequency which is both chronic and worse lately.  ED course: -Temp 102.35F, heart rate 108, respirations 28, blood pressure 149/63, pulse oximetry on room air -Na 132, K 3.1, Cr 0.93 (baseline 0.9), WBC 7.8K, Hgb 10.8, platelets 92. -Lactate 0.8 -INR 1.1 -Urinalysis showed nitrates and WBCs TNTC -Chest x-ray showed old mediastinal shift, no new airspace opacities -She was given 2 L of fluid and ceftriaxone and TRH was asked to evaluate for pyelonephritis     ROS: Review of Systems  Constitutional: Positive for chills, diaphoresis, fever and malaise/fatigue.  Respiratory: Positive for cough (chronic). Negative for sputum production and shortness of breath.   Gastrointestinal: Positive for nausea and vomiting. Negative for abdominal pain.  Genitourinary: Positive for frequency. Negative for dysuria, flank pain, hematuria and urgency.  Skin: Negative for rash (and no  redness swelling of right foot).  Neurological: Positive for weakness.  Psychiatric/Behavioral: Positive for memory loss (confusion).  All other systems reviewed and are negative.         Past Medical History:  Diagnosis Date  . Cancer ([place]) dx'd 09/2013   Lung ca  . Diabetes ([place]) 09/08/2016   type I diabetic patient reported on 09/13/16   . Diabetes mellitus    insulin  . Diabetic neuropathy ([place]) 09/08/2016  . GERD (gastroesophageal reflux disease)    no meds for  . History of hiatal hernia   . Hx of radiation therapy 12/16/13-01/30/14   lung 66Gy  . Hypertension   . Malignant neoplasm of right upper lobe of lung ([place]) 11/28/2013  . Neuropathy   . Psoriasis   . Psoriatic arthritis ([place]) 09/08/2016  . Rheumatoid arthritis ([place])   . Shortness of breath dyspnea    with exertion    Past Surgical History:  Procedure Laterality Date  . ABDOMINAL HYSTERECTOMY    . ABDOMINAL SURGERY    . BALLOON DILATION N/A 12/12/2014   Procedure: BALLOON DILATION;  Surgeon: Inda Castle, MD;  Location: [facility];  Service: Endoscopy;  Laterality: N/A;  . CHOLECYSTECTOMY    . COLONOSCOPY N/A 02/07/2016   Procedure: COLONOSCOPY;  Surgeon: Doran Stabler, MD;  Location: [facility] ENDOSCOPY;  Service: Endoscopy;  Laterality: N/A;  . DILATION AND CURETTAGE OF UTERUS    . ESOPHAGOGASTRODUODENOSCOPY N/A 12/12/2014   Procedure: ESOPHAGOGASTRODUODENOSCOPY (EGD);  Surgeon: Inda Castle, MD;  Location: [facility];  Service: Endoscopy;  Laterality: N/A;  with dilation  . ESOPHAGOGASTRODUODENOSCOPY (EGD) WITH PROPOFOL N/A 10/09/2014   Procedure: ESOPHAGOGASTRODUODENOSCOPY (EGD) WITH PROPOFOL;  Surgeon: Inda Castle, MD;  Location: [place];  Service: Endoscopy;  Laterality: N/A;  . ESOPHAGOGASTRODUODENOSCOPY (EGD) WITH PROPOFOL N/A 09/19/2016   Procedure: ESOPHAGOGASTRODUODENOSCOPY (EGD) WITH PROPOFOL;  Surgeon: Doran Stabler, MD;  Location: [facility] ENDOSCOPY;  Service: Gastroenterology;   Laterality: N/A;  with savary dil tt  . FRACTURE SURGERY    . ORIF ANKLE FRACTURE Right 07/04/2015   Procedure: OPEN REDUCTION INTERNAL FIXATION (ORIF) ANKLE FRACTURE;  Surgeon: Newt Minion, MD;  Location: [place];  Service: Orthopedics;  Laterality: Right;  OPEN REDUCTION, INTERNAL FIXATION OF RIGHT ANKLE FRACTURE.   [name] ORIF ANKLE FRACTURE Left 02/10/2016   Procedure: OPEN REDUCTION INTERNAL FIXATION (ORIF) ANKLE FRACTURE;  Surgeon: Newt Minion, MD;  Location: [facility];  Service: Orthopedics;  Laterality: Left;  . SAVORY DILATION N/A 10/09/2014   Procedure: SAVORY DILATION;  Surgeon: Inda Castle, MD;  Location: [place];  Service: Endoscopy;  Laterality: N/A;  . SAVORY DILATION N/A 09/19/2016   Procedure: SAVORY DILATION;  Surgeon: Doran Stabler, MD;  Location: [facility] ENDOSCOPY;  Service: Gastroenterology;  Laterality: N/A;  . TONSILLECTOMY      Social History: Patient lives alone, has a caregiver [name] who lives with her last thre years.  The patient walks unassisted, supposed to use a cane.  Claims to me she still drives.  She is a former smoker.  Was a Pharmacist, hospital.  From [facility] [place].    Allergies  Allergen Reactions  . Carboplatin Other (See Comments)    Neuropathy  . Remicade [Infliximab] Anaphylaxis  . Hydromorphone Rash    Family history: family history includes Heart attack in her other; Hemachromatosis in her other; Hypertension in her other; Other in her father and mother; Stroke in her other.  Prior to Admission medications   Medication Sig Start Date End Date Taking? Authorizing Provider  amLODipine (NORVASC) 2.5 MG tablet Take 1 tablet (2.5 mg total) by mouth daily. 02/07/17   Burns, [name], MD  APAP-Pamabrom-Pyrilamine (PAMPRIN MAX PAIN FORMULA PO) Take 1 tablet by mouth at bedtime as needed (sleep).    [provider]  augmented betamethasone dipropionate (DIPROLENE-AF) 0.05 % ointment APP AA BID PRN 02/24/17   [provider]  B-D UF III MINI PEN  NEEDLES 31G X 5 MM MISC USE TO CHECK SUGAR FIVE TIMES DAILY 03/13/17   Philemon Kingdom, MD  DULoxetine (CYMBALTA) 60 MG capsule Take 1 capsule (60 mg total) by mouth 2 (two) times daily. 10/26/16   Melvenia Beam, MD  fluticasone-salmeterol (ADVAIR HFA) [phone] MCG/ACT inhaler Inhale 2 puffs into the lungs 2 (two) times daily as needed.     [provider]  insulin lispro (HUMALOG KWIKPEN) 100 UNIT/ML KiwkPen INJECT 8-20 units 3x a day under skin Patient taking differently: Inject 8-20 Units into the skin 4 (four) times daily. Per patient sliding scale. 06/16/16   Philemon Kingdom, MD  LEVEMIR FLEXTOUCH 100 UNIT/ML Pen INJECT 30 UNITS INTO SKIN EVERY NIGHT AT BEDTIME 03/21/17   Philemon Kingdom, MD  neomycin-bacitracin-polymyxin (NEOSPORIN) [phone] ointment Apply topically 4 (four) times daily. Apply to  wounds at the right face 01/12/17   Elgergawy, Silver Huguenin, MD  [facility] DELICA LANCETS FINE MISC USE TO TEST BLOOD SUGAR FIVE TIMES DAILY AS NEEDED 07/27/16   Philemon Kingdom, MD  [facility] VERIO test strip Use 5x a day 10/14/16   Philemon Kingdom, MD  oxyCODONE-acetaminophen (PERCOCET/ROXICET) 5-325 MG tablet TAKE 1 TABLET BY MOUTH EVERY4-6 HOURS AS NEEDED FOR PAIN. 08/20/16   [provider]  pregabalin (LYRICA) 150 MG capsule Take 1 capsule (150 mg total) by mouth 2 (two) times daily. 10/26/16   Melvenia Beam, MD  saccharomyces boulardii (FLORASTOR) 250 MG capsule Take 1 capsule (250 mg total) by mouth 2 (two) times daily. Patient not taking: Reported on 03/08/2017 01/12/17   Elgergawy, Silver Huguenin, MD  solifenacin (VESICARE) 5 MG tablet Take 5 mg by mouth at bedtime.     [provider]  sulfaSALAzine (AZULFIDINE) 500 MG EC tablet Take 2 tablets (1,000 mg total) by mouth 2 (two) times daily. 03/08/17   Bo Merino, MD       Physical Exam: BP 127/66   Pulse 94   Temp (!) 101.9 F (38.8 C) (Rectal)   Resp (!) 23   SpO2 94%  General appearance:  Well-developed, elderly adult female, alert and in no acute distress, appears tired, weak.   Eyes: Anicteric, conjunctiva pink, lids and lashes normal. PERRL.    ENT: No nasal deformity, discharge, epistaxis.  Hearing normal. OP moist without lesions.   Neck: No neck masses.  Trachea midline.  No thyromegaly/tenderness. Lymph: No cervical or supraclavicular lymphadenopathy. Skin: Warm and diaphoretic.  No jaundice.  No suspicious rashes or lesions.  No redness or swelling or pain of right foot, except mild point tenderness at site of old hardware removal Cardiac: Tachycardic, nl S1-S2, no murmurs appreciated.  Capillary refill is brisk.  JVP not visible.  No LE edema.  Radial pulses 2+ and symmetric. Respiratory: Normal respiratory rate and rhythm.  CTAB without rales.  Rare wheezes. Abdomen: Abdomen soft.  No TTP. No ascites, distension, hepatosplenomegaly.   MSK: No deformities or effusions.  No cyanosis or clubbing. Neuro: Cranial nerves 3-12 intact.  Sensation intact to light touch. Speech is fluent.  Muscle strength 5/5 and symmetric.    Psych: Sensorium intact and responding to questions, attention normal, oriented to person, plac and time, but tangential, seems somewhat forgetful.  Affect normal.  Judgment and insight appear normal.     Labs on Admission:  I have personally reviewed following labs and imaging studies: CBC:  Recent Labs Lab 03/22/17 1745  WBC 7.8  NEUTROABS 6.4  HGB 10.8*  HCT 33.8*  MCV 88.3  PLT 92*   Basic Metabolic Panel:  Recent Labs Lab 03/22/17 1745  NA 132*  K 3.1*  CL 100*  CO2 21*  GLUCOSE 257*  BUN 11  CREATININE 0.93  CALCIUM 7.7*   GFR: CrCl cannot be calculated (Unknown ideal weight.).  Liver Function Tests:  Recent Labs Lab 03/22/17 1745  AST 32  ALT 27  ALKPHOS 154*  BILITOT 1.5*  PROT 5.7*  ALBUMIN 2.8*   No results for input(s): LIPASE, AMYLASE in the last 168 hours. No results for input(s): AMMONIA in the last 168  hours. Coagulation Profile:  Recent Labs Lab 03/22/17 1745  INR 1.11   Cardiac Enzymes: No results for input(s): CKTOTAL, CKMB, CKMBINDEX, TROPONINI in the last 168 hours. BNP (last 3 results) No results for input(s): PROBNP in the last 8760 hours. HbA1C: No results for input(s): HGBA1C in the last 72 hours. CBG: No results for input(s): GLUCAP in the last 168 hours. Lipid Profile: No results for input(s): CHOL, HDL, LDLCALC, TRIG, CHOLHDL, LDLDIRECT in the last 72 hours. Thyroid Function Tests: No results for input(s): TSH, T4TOTAL, FREET4, T3FREE, THYROIDAB in the last 72  hours. Anemia Panel: No results for input(s): VITAMINB12, FOLATE, FERRITIN, TIBC, IRON, RETICCTPCT in the last 72 hours. Sepsis Labs: Lactic acid 0.8 Invalid input(s): PROCALCITONIN, LACTICIDVEN No results found for this or any previous visit (from the past 240 hour(s)).       Radiological Exams on Admission: Personally reviewed CXR shows no new airspace disease: Dg Chest 2 View  Result Date: 03/22/2017 CLINICAL DATA:  Short of breath EXAM: CHEST  2 VIEW COMPARISON:  02/02/2017 FINDINGS: Volume loss on the right as before. Left lung is clear. Stable enlarged cardiomediastinal silhouette. No pneumothorax. IMPRESSION: Volume loss on the right with shift of mediastinal contents to the right. This finding is unchanged. No definite acute interval change compared to prior. Electronically Signed   By: Donavan Foil M.D.   On: 03/22/2017 18:38    EKG: Independently reviewed. Rate 107, QTc 462, sinus tachycardia, no ST changes.    Assessment/Plan Principal Problem:   Altered mental status Active Problems:   Malignant neoplasm of right upper lobe of lung (HCC)   Cancer of middle lobe of lung (HCC)   Thrombocytopenia, unspecified (HCC)   Emphysema of lung (HCC)   Psoriatic arthropathy ([place])   Essential hypertension   Type 2 diabetes mellitus with diabetic polyneuropathy, with long-term current use of insulin  (HCC)   Acute pyelonephritis   Hyponatremia   Hypokalemia  1. Pyelonephritis, SIRS criteria:  No evidence of end organ damage.  Doubt postoperative right foot infection.   -Ceftriaxone IV -Follow up blood and urine cultures -Hold Vesicare   2. Hyponatremia:  Mild -Check urine sodium and free water clearance  3. Hypokalemia:  Mild -M IVF with K -Check mag  4. Insulin-dependent diabetes:  Hyperglycemic at presentation -Continue Levemir -SSI with meals  5. Thrombocytopenia:  Worse than usual -Monitor -SCDs  6. Emphysema:  Clinically inactive -Continue Advair  7. Psoriatic arthritis:  Stable -Continue sulfasalazine  8. Hypertension: Hypertensive at admission -hold amlodipine until hemodynamics clearer  9. Other medications: -Continue duloxetine, Lyrica          DVT prophylaxis: SCDs  Code Status: Limited, no CPR, intubation, POA is lawyer in [place] Communication: None present  Disposition Plan: Anticipate IV fluids, IV antibiotisc and follow cultures Consults called: None Admission status: OBS At the point of initial evaluation, it is my clinical opinion that admission for OBSERVATION is reasonable and necessary because the patient's presenting complaints in the context of their chronic conditions represent sufficient risk of deterioration or significant morbidity to constitute reasonable grounds for close observation in the hospital setting, but that the patient may be medically stable for discharge from the hospital within 24 to 48 hours or may need additional fluids and IV antibiotics as an inpatient.   Medical decision making: Patient seen at 7:50 PM on 03/22/2017.  The patient was discussed with Dr. Gilford Raid.  What exists of the patient's chart was reviewed in depth and summarized above.  Clinical condition: HR improving with fluids, still altered.        Edwin Dada [facility] Pager [phone]

## 2017-03-22 NOTE — ED Triage Notes (Signed)
Pt presents via [facility] for evaluation of ams today. Pt is CA pt with stage 4 lung cancer, reports currently not receiving treatment. Pt is alert and oriented to person/place/situation but is confused about details. No meds PTA.

## 2017-03-22 NOTE — Progress Notes (Signed)
Addendum: CT of head from March reviewed, trace amount of blood in parietal lobe sulcus.  Was watched overnight, asymptomatic, had no problems since then.  Today on Neuro exam she has normal and equal pupils, EOM are intact without nystagmus.  Cranial nerves 5-12 are normal.  Cranial nerve 7 is symmetrical.  Cranial nerve 8 is within normal limits.  Cranial nerves 9 and 10 reveal equal palate elevation.  Cranial nerve 11 reveals sternocleidomastoid strong.  Cranial nerve 12 is midline.  Motor strength testing is 5/5 in the upper and lower extremities bilaterally with normal motor, tone and bulk. Sensory examination is intact to light touch.  No pronator drift. The patient is oriented to time, place and person.  Speech is fluent.  Naming is grossly intact.  Recall, recent and remote, as well as general fund of knowledge seem slightly globally impaired.  Attention span and concentration are within normal limits.  I do not suspect encephalitis or worsening of her SAH.

## 2017-03-23 DIAGNOSIS — R410 Disorientation, unspecified: Secondary | ICD-10-CM | POA: Diagnosis not present

## 2017-03-23 DIAGNOSIS — J439 Emphysema, unspecified: Secondary | ICD-10-CM | POA: Diagnosis present

## 2017-03-23 DIAGNOSIS — E11649 Type 2 diabetes mellitus with hypoglycemia without coma: Secondary | ICD-10-CM | POA: Diagnosis not present

## 2017-03-23 DIAGNOSIS — E876 Hypokalemia: Secondary | ICD-10-CM | POA: Diagnosis present

## 2017-03-23 DIAGNOSIS — D696 Thrombocytopenia, unspecified: Secondary | ICD-10-CM | POA: Diagnosis present

## 2017-03-23 DIAGNOSIS — Z87891 Personal history of nicotine dependence: Secondary | ICD-10-CM | POA: Diagnosis not present

## 2017-03-23 DIAGNOSIS — E871 Hypo-osmolality and hyponatremia: Secondary | ICD-10-CM | POA: Diagnosis present

## 2017-03-23 DIAGNOSIS — Z794 Long term (current) use of insulin: Secondary | ICD-10-CM | POA: Diagnosis not present

## 2017-03-23 DIAGNOSIS — I10 Essential (primary) hypertension: Secondary | ICD-10-CM | POA: Diagnosis present

## 2017-03-23 DIAGNOSIS — G9341 Metabolic encephalopathy: Secondary | ICD-10-CM | POA: Diagnosis present

## 2017-03-23 DIAGNOSIS — E1142 Type 2 diabetes mellitus with diabetic polyneuropathy: Secondary | ICD-10-CM | POA: Diagnosis present

## 2017-03-23 DIAGNOSIS — E1165 Type 2 diabetes mellitus with hyperglycemia: Secondary | ICD-10-CM | POA: Diagnosis present

## 2017-03-23 DIAGNOSIS — F329 Major depressive disorder, single episode, unspecified: Secondary | ICD-10-CM | POA: Diagnosis present

## 2017-03-23 DIAGNOSIS — K219 Gastro-esophageal reflux disease without esophagitis: Secondary | ICD-10-CM | POA: Diagnosis present

## 2017-03-23 DIAGNOSIS — Z79899 Other long term (current) drug therapy: Secondary | ICD-10-CM | POA: Diagnosis not present

## 2017-03-23 DIAGNOSIS — C342 Malignant neoplasm of middle lobe, bronchus or lung: Secondary | ICD-10-CM | POA: Diagnosis not present

## 2017-03-23 DIAGNOSIS — A419 Sepsis, unspecified organism: Secondary | ICD-10-CM | POA: Diagnosis not present

## 2017-03-23 DIAGNOSIS — R7881 Bacteremia: Secondary | ICD-10-CM

## 2017-03-23 DIAGNOSIS — M19041 Primary osteoarthritis, right hand: Secondary | ICD-10-CM | POA: Diagnosis present

## 2017-03-23 DIAGNOSIS — C3411 Malignant neoplasm of upper lobe, right bronchus or lung: Secondary | ICD-10-CM | POA: Diagnosis present

## 2017-03-23 DIAGNOSIS — A4151 Sepsis due to Escherichia coli [E. coli]: Secondary | ICD-10-CM | POA: Diagnosis present

## 2017-03-23 DIAGNOSIS — N3000 Acute cystitis without hematuria: Secondary | ICD-10-CM | POA: Diagnosis present

## 2017-03-23 DIAGNOSIS — M069 Rheumatoid arthritis, unspecified: Secondary | ICD-10-CM | POA: Diagnosis present

## 2017-03-23 DIAGNOSIS — N10 Acute tubulo-interstitial nephritis: Secondary | ICD-10-CM | POA: Diagnosis present

## 2017-03-23 DIAGNOSIS — Z7951 Long term (current) use of inhaled steroids: Secondary | ICD-10-CM | POA: Diagnosis not present

## 2017-03-23 DIAGNOSIS — M19042 Primary osteoarthritis, left hand: Secondary | ICD-10-CM | POA: Diagnosis present

## 2017-03-23 DIAGNOSIS — Z885 Allergy status to narcotic agent status: Secondary | ICD-10-CM | POA: Diagnosis not present

## 2017-03-23 DIAGNOSIS — L4050 Arthropathic psoriasis, unspecified: Secondary | ICD-10-CM | POA: Diagnosis present

## 2017-03-23 LAB — BLOOD CULTURE ID PANEL (REFLEXED)
Acinetobacter baumannii: NOT DETECTED
CANDIDA PARAPSILOSIS: NOT DETECTED
CANDIDA TROPICALIS: NOT DETECTED
CARBAPENEM RESISTANCE: NOT DETECTED
Candida albicans: NOT DETECTED
Candida glabrata: NOT DETECTED
Candida krusei: NOT DETECTED
ENTEROBACTERIACEAE SPECIES: DETECTED — AB
ENTEROCOCCUS SPECIES: NOT DETECTED
Enterobacter cloacae complex: NOT DETECTED
Escherichia coli: DETECTED — AB
HAEMOPHILUS INFLUENZAE: NOT DETECTED
KLEBSIELLA PNEUMONIAE: NOT DETECTED
Klebsiella oxytoca: NOT DETECTED
Listeria monocytogenes: NOT DETECTED
NEISSERIA MENINGITIDIS: NOT DETECTED
PSEUDOMONAS AERUGINOSA: NOT DETECTED
Proteus species: NOT DETECTED
STREPTOCOCCUS PYOGENES: NOT DETECTED
STREPTOCOCCUS SPECIES: NOT DETECTED
Serratia marcescens: NOT DETECTED
Staphylococcus aureus (BCID): NOT DETECTED
Staphylococcus species: NOT DETECTED
Streptococcus agalactiae: NOT DETECTED
Streptococcus pneumoniae: NOT DETECTED

## 2017-03-23 LAB — BASIC METABOLIC PANEL
ANION GAP: 8 (ref 5–15)
BUN: 12 mg/dL (ref 6–20)
CALCIUM: 7.5 mg/dL — AB (ref 8.9–10.3)
CO2: 22 mmol/L (ref 22–32)
CREATININE: 0.97 mg/dL (ref 0.44–1.00)
Chloride: 104 mmol/L (ref 101–111)
GFR calc non Af Amer: 56 mL/min — ABNORMAL LOW (ref >60)
Glucose, Bld: 296 mg/dL — ABNORMAL HIGH (ref 65–99)
Potassium: 3.1 mmol/L — ABNORMAL LOW (ref 3.5–5.1)
SODIUM: 134 mmol/L — AB (ref 135–145)

## 2017-03-23 LAB — CBC
HCT: 33.5 % — ABNORMAL LOW (ref 36.0–46.0)
Hemoglobin: 10.6 g/dL — ABNORMAL LOW (ref 12.0–15.0)
MCH: 28.2 pg (ref 26.0–34.0)
MCHC: 31.6 g/dL (ref 30.0–36.0)
MCV: 89.1 fL (ref 78.0–100.0)
PLATELETS: 85 10*3/uL — AB (ref 150–400)
RBC: 3.76 MIL/uL — ABNORMAL LOW (ref 3.87–5.11)
RDW: 17.5 % — ABNORMAL HIGH (ref 11.5–15.5)
WBC: 5.3 10*3/uL (ref 4.0–10.5)

## 2017-03-23 LAB — GLUCOSE, CAPILLARY
GLUCOSE-CAPILLARY: 199 mg/dL — AB (ref 65–99)
Glucose-Capillary: 237 mg/dL — ABNORMAL HIGH (ref 65–99)
Glucose-Capillary: 179 mg/dL — ABNORMAL HIGH (ref 65–99)
Glucose-Capillary: 165 mg/dL — ABNORMAL HIGH (ref 65–99)

## 2017-03-23 LAB — OSMOLALITY: OSMOLALITY: 294 mosm/kg (ref 275–295)

## 2017-03-23 LAB — SODIUM, URINE, RANDOM: SODIUM UR: 60 mmol/L

## 2017-03-23 MED ORDER — DEXTROSE 5 % IV SOLN
2.0000 g | INTRAVENOUS | Status: DC
  Administered 2017-03-23 – 2017-03-24 (×2): 2 g via INTRAVENOUS
  Filled 2017-03-23 (×2): qty 2

## 2017-03-23 MED ORDER — IPRATROPIUM-ALBUTEROL 0.5-2.5 (3) MG/3ML IN SOLN
3.0000 mL | RESPIRATORY_TRACT | Status: DC | PRN
  Administered 2017-03-23: 3 mL via RESPIRATORY_TRACT
  Filled 2017-03-23: qty 3

## 2017-03-23 MED ORDER — POTASSIUM CHLORIDE CRYS ER 20 MEQ PO TBCR
40.0000 meq | EXTENDED_RELEASE_TABLET | Freq: Once | ORAL | Status: AC
  Administered 2017-03-23: 40 meq via ORAL
  Filled 2017-03-23: qty 2

## 2017-03-23 NOTE — Progress Notes (Signed)
PROGRESS NOTE    Megan [name]  IWP:[id number] DOB: [birth year] DOA: 03/22/2017 PCP: Binnie Rail, MD   Brief Narrative:  76 year old female with past medical history of psoriatic arthritis on sulfasalazine, hypertension, diabetes type 2, recent subarachnoid hemorrhage, empyema and lung cancer stage IIIB not on chemotherapy, thrombocytopenia came to the ER with the complaints of abdominal pain and fever. Patient stated she has not been feeling herself and had reported weakness. Upon admission she was found to have urinary tract infection therefore started on Levaquin. At the time she also met SIRS criteria.   Assessment & Plan:   Principal Problem:   Altered mental status Active Problems:   Malignant neoplasm of right upper lobe of lung (HCC)   Cancer of middle lobe of lung (HCC)   Thrombocytopenia, unspecified (HCC)   Emphysema of lung (HCC)   Psoriatic arthropathy ([place])   Essential hypertension   Type 2 diabetes mellitus with diabetic polyneuropathy, with long-term current use of insulin (HCC)   Acute pyelonephritis   Hyponatremia   Hypokalemia  Urinary tract infection with concerns of acute pyelonephritis Gram-negative rod bacteremia-urine is a likely source -Sepsis protocol initiated -Continue IV fluids. Blood cultures growing Escherichia coli and Enterobacter -Continue ceftriaxone IV at this time. Follow-up culture sensitivities -Order urine culture -Pain control -If she decompensates, we will broaden her antibiotic coverage -No need for CT abdomen pelvis at this  Hyponatremia -Appears to be improving with IV fluids -We'll closely monitor  Hypokalemia -Replete as needed -Magnesium 1.8  Diabetes type 2 -Continue Levemir 30u and sliding scale  Thrombocytopenia -Continue to monitor  Emphysema -Continue nebulizer  Psoriatic arthritis, stable -Continue sulfasalazine  Hypertension  -We'll continue hold Norvasc as her blood pressure is stable but afraid  she could become bacteremic and going to septic shock. Can restart it once her blood pressure remains stable. Meanwhile if needed we can treated with IV hydralazine  History of depression -Continue duloxetine and lyrica  DVT prophylaxis: SCDs  Code Status: Partial Family Communication:  None at bedside. Patient is well Disposition Plan: Change to inpatient  It is my clinical opinion that admission to INPATIENT is reasonable and necessary in this 76 y.o. female . presenting with symptoms of abdominal pain, concerning for sepsis secondary to urinary tract infection/acute pyelonephritis . in the context of PMH including: Lung cancer,, cytopenia, psoriatic arthritis . with pertinent positives on physical exam including: CVA tenderness . and pertinent positives on radiographic and laboratory data including: UA shows concerns for UTI, blood cultures growing gram-negative rods . Workup and treatment include awaiting culture sensitivities meanwhile getting IV ceftriaxone acute pyelonephritis complicated by gram-negative rod bacteremia. She will require IV antibiotic treatment for now.  Given the aforementioned, the predictability of an adverse outcome is felt to be significant. I expect that the patient will require at least 2 midnights in the hospital to treat this condition.   Consultants:   None  Procedures:   None  Antimicrobials:   Ceftriaxone 5/23 >   Subjective: Patient still reports of bilateral back pain especially when tablet on her back. No other complaints at this time besides mild urinary burning.  Objective: Vitals:   03/22/17 1945 03/22/17 2000 03/22/17 2100 03/23/17 0447  BP: (!) 133/58 127/66 (!) 105/56 133/65  Pulse: 94 94 93 94  Resp: (!) 22 (!) '23 20 19  ' Temp:   98.4 F (36.9 C) 98.7 F (37.1 C)  TempSrc:   Oral Oral  SpO2: 91% 94% 93% 94%  Intake/Output Summary (Last 24 hours) at 03/23/17 1143 Last data filed at 03/23/17 0910  Gross per 24 hour    Intake             1025 ml  Output                0 ml  Net             1025 ml   There were no vitals filed for this visit.  Examination:  General exam: Appears calm and comfortable  Respiratory system: Clear to auscultation. Respiratory effort normal. Cardiovascular system: S1 & S2 heard, RRR. No JVD, murmurs, rubs, gallops or clicks. No pedal edema. Gastrointestinal system: Abdomen is nondistended, soft and nontender. No organomegaly or masses felt. Normal bowel sounds heard.Bilateral CVA tenderness Central nervous system: Alert and oriented. No focal neurological deficits. Extremities: Symmetric 5 x 5 power. Skin: No rashes, lesions or ulcers Psychiatry: Judgement and insight appear normal. Mood & affect appropriate.     Data Reviewed:   CBC:  Recent Labs Lab 03/22/17 1745 03/23/17 0310  WBC 7.8 5.3  NEUTROABS 6.4  --   HGB 10.8* 10.6*  HCT 33.8* 33.5*  MCV 88.3 89.1  PLT 92* 85*   Basic Metabolic Panel:  Recent Labs Lab 03/22/17 1745 03/22/17 2203 03/23/17 0310  NA 132*  --  134*  K 3.1*  --  3.1*  CL 100*  --  104  CO2 21*  --  22  GLUCOSE 257*  --  296*  BUN 11  --  12  CREATININE 0.93  --  0.97  CALCIUM 7.7*  --  7.5*  MG  --  1.8  --    GFR: CrCl cannot be calculated (Unknown ideal weight.). Liver Function Tests:  Recent Labs Lab 03/22/17 1745  AST 32  ALT 27  ALKPHOS 154*  BILITOT 1.5*  PROT 5.7*  ALBUMIN 2.8*   No results for input(s): LIPASE, AMYLASE in the last 168 hours. No results for input(s): AMMONIA in the last 168 hours. Coagulation Profile:  Recent Labs Lab 03/22/17 1745  INR 1.11   Cardiac Enzymes: No results for input(s): CKTOTAL, CKMB, CKMBINDEX, TROPONINI in the last 168 hours. BNP (last 3 results) No results for input(s): PROBNP in the last 8760 hours. HbA1C: No results for input(s): HGBA1C in the last 72 hours. CBG:  Recent Labs Lab 03/22/17 2145 03/23/17 0739  GLUCAP 333* 237*   Lipid Profile: No  results for input(s): CHOL, HDL, LDLCALC, TRIG, CHOLHDL, LDLDIRECT in the last 72 hours. Thyroid Function Tests: No results for input(s): TSH, T4TOTAL, FREET4, T3FREE, THYROIDAB in the last 72 hours. Anemia Panel: No results for input(s): VITAMINB12, FOLATE, FERRITIN, TIBC, IRON, RETICCTPCT in the last 72 hours. Sepsis Labs:  Recent Labs Lab 03/22/17 1757 03/22/17 1954  LATICACIDVEN 0.80 0.57    Recent Results (from the past 240 hour(s))  Culture, blood (Routine x 2)     Status: None (Preliminary result)   Collection Time: 03/22/17  5:51 PM  Result Value Ref Range Status   Specimen Description BLOOD BLOOD LEFT FOREARM  Final   Special Requests   Final    BOTTLES DRAWN AEROBIC AND ANAEROBIC Blood Culture adequate volume   Culture  Setup Time   Final    GRAM NEGATIVE RODS IN BOTH AEROBIC AND ANAEROBIC BOTTLES Organism ID to follow CRITICAL RESULT CALLED TO, READ BACK BY AND VERIFIED WITH: Karren Cobble 03/23/17 @ 0846 M VESTAL    Culture GRAM NEGATIVE  RODS  Final   Report Status PENDING  Incomplete  Blood Culture ID Panel (Reflexed)     Status: Abnormal   Collection Time: 03/22/17  5:51 PM  Result Value Ref Range Status   Enterococcus species NOT DETECTED NOT DETECTED Final   Listeria monocytogenes NOT DETECTED NOT DETECTED Final   Staphylococcus species NOT DETECTED NOT DETECTED Final   Staphylococcus aureus NOT DETECTED NOT DETECTED Final   Streptococcus species NOT DETECTED NOT DETECTED Final   Streptococcus agalactiae NOT DETECTED NOT DETECTED Final   Streptococcus pneumoniae NOT DETECTED NOT DETECTED Final   Streptococcus pyogenes NOT DETECTED NOT DETECTED Final   Acinetobacter baumannii NOT DETECTED NOT DETECTED Final   Enterobacteriaceae species DETECTED (A) NOT DETECTED Final    Comment: Enterobacteriaceae represent a large family of gram-negative bacteria, not a single organism. CRITICAL RESULT CALLED TO, READ BACK BY AND VERIFIED WITH: Karren Cobble 03/23/17 @ 0846 M VESTAL      Enterobacter cloacae complex NOT DETECTED NOT DETECTED Final   Escherichia coli DETECTED (A) NOT DETECTED Final    Comment: CRITICAL RESULT CALLED TO, READ BACK BY AND VERIFIED WITH: Karren Cobble 03/23/17 @ 0846 M VESTAL    Klebsiella oxytoca NOT DETECTED NOT DETECTED Final   Klebsiella pneumoniae NOT DETECTED NOT DETECTED Final   Proteus species NOT DETECTED NOT DETECTED Final   Serratia marcescens NOT DETECTED NOT DETECTED Final   Carbapenem resistance NOT DETECTED NOT DETECTED Final   Haemophilus influenzae NOT DETECTED NOT DETECTED Final   Neisseria meningitidis NOT DETECTED NOT DETECTED Final   Pseudomonas aeruginosa NOT DETECTED NOT DETECTED Final   Candida albicans NOT DETECTED NOT DETECTED Final   Candida glabrata NOT DETECTED NOT DETECTED Final   Candida krusei NOT DETECTED NOT DETECTED Final   Candida parapsilosis NOT DETECTED NOT DETECTED Final   Candida tropicalis NOT DETECTED NOT DETECTED Final  Culture, blood (Routine x 2)     Status: None (Preliminary result)   Collection Time: 03/22/17  6:10 PM  Result Value Ref Range Status   Specimen Description BLOOD RIGHT ANTECUBITAL  Final   Special Requests   Final    BOTTLES DRAWN AEROBIC AND ANAEROBIC Blood Culture adequate volume   Culture  Setup Time   Final    GRAM NEGATIVE RODS IN BOTH AEROBIC AND ANAEROBIC BOTTLES CRITICAL RESULT CALLED TO, READ BACK BY AND VERIFIED WITH: Karren Cobble 03/23/17 @ 0846 M VESTAL    Culture GRAM NEGATIVE RODS  Final   Report Status PENDING  Incomplete         Radiology Studies: Dg Chest 2 View  Result Date: 03/22/2017 CLINICAL DATA:  Short of breath EXAM: CHEST  2 VIEW COMPARISON:  02/02/2017 FINDINGS: Volume loss on the right as before. Left lung is clear. Stable enlarged cardiomediastinal silhouette. No pneumothorax. IMPRESSION: Volume loss on the right with shift of mediastinal contents to the right. This finding is unchanged. No definite acute interval change compared to prior.  Electronically Signed   By: Donavan Foil M.D.   On: 03/22/2017 18:38        Scheduled Meds: . DULoxetine  60 mg Oral BID  . insulin aspart  0-15 Units Subcutaneous TID WC  . insulin aspart  0-5 Units Subcutaneous QHS  . insulin detemir  30 Units Subcutaneous Q2200  . mometasone-formoterol  2 puff Inhalation BID  . pregabalin  150 mg Oral BID  . sulfaSALAzine  1,000 mg Oral BID   Continuous Infusions: . 0.9 % NaCl with KCl 20  mEq / L 75 mL/hr at 03/22/17 2127  . cefTRIAXone (ROCEPHIN)  IV       LOS: 0 days    Time spent: 35 mins     Ankit Arsenio Loader, MD [facility] Pager [phone]   If 7PM-7AM, please contact night-coverage [email or web address] Password [facility] 03/23/2017, 11:43 AM

## 2017-03-23 NOTE — Progress Notes (Signed)
PHARMACY - PHYSICIAN COMMUNICATION CRITICAL VALUE ALERT - BLOOD CULTURE IDENTIFICATION (BCID)  Results for orders placed or performed during the hospital encounter of 03/22/17  Blood Culture ID Panel (Reflexed) (Collected: 03/22/2017  5:51 PM)  Result Value Ref Range   Enterococcus species NOT DETECTED NOT DETECTED   Listeria monocytogenes NOT DETECTED NOT DETECTED   Staphylococcus species NOT DETECTED NOT DETECTED   Staphylococcus aureus NOT DETECTED NOT DETECTED   Streptococcus species NOT DETECTED NOT DETECTED   Streptococcus agalactiae NOT DETECTED NOT DETECTED   Streptococcus pneumoniae NOT DETECTED NOT DETECTED   Streptococcus pyogenes NOT DETECTED NOT DETECTED   Acinetobacter baumannii NOT DETECTED NOT DETECTED   Enterobacteriaceae species DETECTED (A) NOT DETECTED   Enterobacter cloacae complex NOT DETECTED NOT DETECTED   Escherichia coli DETECTED (A) NOT DETECTED   Klebsiella oxytoca NOT DETECTED NOT DETECTED   Klebsiella pneumoniae NOT DETECTED NOT DETECTED   Proteus species NOT DETECTED NOT DETECTED   Serratia marcescens NOT DETECTED NOT DETECTED   Carbapenem resistance NOT DETECTED NOT DETECTED   Haemophilus influenzae NOT DETECTED NOT DETECTED   Neisseria meningitidis NOT DETECTED NOT DETECTED   Pseudomonas aeruginosa NOT DETECTED NOT DETECTED   Candida albicans NOT DETECTED NOT DETECTED   Candida glabrata NOT DETECTED NOT DETECTED   Candida krusei NOT DETECTED NOT DETECTED   Candida parapsilosis NOT DETECTED NOT DETECTED   Candida tropicalis NOT DETECTED NOT DETECTED   76 year old female admitted with suspected pyelonephritis.  She now has both sets of blood cultures positive for Gram negative rods with E coli detected on BCID.  She received ceftriaxone 2g IV last night and is currently on ceftriaxone 1g IV q24h.  Name of physician (or Provider) Contacted: Dr. Reesa Chew  Changes to prescribed antibiotics required: Increase Ceftriaxone to 2g IV q24h for bacteremia per  protocol.  Norva Riffle 03/23/2017  9:20 AM

## 2017-03-24 ENCOUNTER — Inpatient Hospital Stay (HOSPITAL_COMMUNITY): Payer: Medicare Other

## 2017-03-24 DIAGNOSIS — A419 Sepsis, unspecified organism: Secondary | ICD-10-CM

## 2017-03-24 LAB — BASIC METABOLIC PANEL
Anion gap: 7 (ref 5–15)
BUN: 6 mg/dL (ref 6–20)
CALCIUM: 7.9 mg/dL — AB (ref 8.9–10.3)
CO2: 24 mmol/L (ref 22–32)
Chloride: 105 mmol/L (ref 101–111)
Creatinine, Ser: 0.75 mg/dL (ref 0.44–1.00)
GFR calc Af Amer: >60 mL/min (ref >60)
GFR calc non Af Amer: >60 mL/min (ref >60)
GLUCOSE: 143 mg/dL — AB (ref 65–99)
Potassium: 3.5 mmol/L (ref 3.5–5.1)
Sodium: 136 mmol/L (ref 135–145)

## 2017-03-24 LAB — GLUCOSE, CAPILLARY
GLUCOSE-CAPILLARY: 208 mg/dL — AB (ref 65–99)
Glucose-Capillary: 111 mg/dL — ABNORMAL HIGH (ref 65–99)
Glucose-Capillary: 55 mg/dL — ABNORMAL LOW (ref 65–99)
Glucose-Capillary: 98 mg/dL (ref 65–99)
Glucose-Capillary: 230 mg/dL — ABNORMAL HIGH (ref 65–99)

## 2017-03-24 LAB — CBC WITH DIFFERENTIAL/PLATELET
Basophils Absolute: 0 10*3/uL (ref 0.0–0.1)
Basophils Relative: 0 %
EOS PCT: 1 %
Eosinophils Absolute: 0.1 10*3/uL (ref 0.0–0.7)
HCT: 35.3 % — ABNORMAL LOW (ref 36.0–46.0)
Hemoglobin: 11.2 g/dL — ABNORMAL LOW (ref 12.0–15.0)
LYMPHS ABS: 0.8 10*3/uL (ref 0.7–4.0)
Lymphocytes Relative: 11 %
MCH: 27.9 pg (ref 26.0–34.0)
MCHC: 31.7 g/dL (ref 30.0–36.0)
MCV: 87.8 fL (ref 78.0–100.0)
MONO ABS: 0.8 10*3/uL (ref 0.1–1.0)
MONOS PCT: 11 %
Neutro Abs: 5.5 10*3/uL (ref 1.7–7.7)
Neutrophils Relative %: 76 %
PLATELETS: 115 10*3/uL — AB (ref 150–400)
RBC: 4.02 MIL/uL (ref 3.87–5.11)
RDW: 17.4 % — AB (ref 11.5–15.5)
WBC: 7.2 10*3/uL (ref 4.0–10.5)

## 2017-03-24 MED ORDER — POTASSIUM CHLORIDE 20 MEQ/15ML (10%) PO SOLN
40.0000 meq | ORAL | Status: AC
  Administered 2017-03-24 (×2): 40 meq via ORAL
  Filled 2017-03-24 (×2): qty 30

## 2017-03-24 MED ORDER — INSULIN DETEMIR 100 UNIT/ML ~~LOC~~ SOLN
15.0000 [IU] | Freq: Every day | SUBCUTANEOUS | Status: DC
  Administered 2017-03-24: 15 [IU] via SUBCUTANEOUS
  Filled 2017-03-24: qty 0.15

## 2017-03-24 MED ORDER — IPRATROPIUM-ALBUTEROL 0.5-2.5 (3) MG/3ML IN SOLN
3.0000 mL | Freq: Four times a day (QID) | RESPIRATORY_TRACT | Status: DC
  Administered 2017-03-24 (×2): 3 mL via RESPIRATORY_TRACT
  Filled 2017-03-24 (×2): qty 3

## 2017-03-24 MED ORDER — IPRATROPIUM-ALBUTEROL 0.5-2.5 (3) MG/3ML IN SOLN: 3.0000 mL | RESPIRATORY_TRACT | Status: DC | PRN

## 2017-03-24 NOTE — Care Management Note (Signed)
Case Management Note  Patient Details  Name: Megan [name] MRN: [id number] Date of Birth: 12-02-1940  Subjective/Objective:    Pt admitted on 03/22/17 with sepsis due to pyelonephritis.  PTA, pt independent, lives at home with [name].                  Action/Plan: Pt denies any needs for home; will follow.    Expected Discharge Date:                  Expected Discharge Plan:  Home/Self Care  In-House Referral:     Discharge planning Services  CM Consult  Post Acute Care Choice:    Choice offered to:     DME Arranged:    DME Agency:     HH Arranged:    HH Agency:     Status of Service:  In process, will continue to follow  If discussed at Long Length of Stay Meetings, dates discussed:    Additional Comments:  Ella Bodo, RN 03/24/2017, 4:18 PM

## 2017-03-24 NOTE — Progress Notes (Addendum)
PROGRESS NOTE    Megan [name]  WIO:[id number] DOB: May 23, 1941 DOA: 03/22/2017 PCP: Binnie Rail, MD    Brief Narrative:  76 yo female presents with the chief complain of weakness, confusion and fever. Patient known to have HTN, T2DM, recent SAH, emphysema, stage IIIB lung cancer. Worsening weakness for the last 3 days before admission, associated with increased urinary frequency. On the initial physical examination, she was septic with temperature of 102.6, heart rate 108, respiratory rate 28 and blood pressure 149/63. Her lungs were clear to auscultation, heart S1 and S2 rhythmic and tachycardic, abdomen soft and non tender, lower extremity with no edema. Urina analysis positive for infection. Patient admitted with the working diagnosis of sepsis due to pyelonephritis. Started on antibiotic therapy with ceftriaxone, bloos cultures positive for E coli and enterobacter.    Assessment & Plan:   Principal Problem:   Altered mental status Active Problems:   Malignant neoplasm of right upper lobe of lung (HCC)   Cancer of middle lobe of lung (HCC)   Thrombocytopenia, unspecified (HCC)   Emphysema of lung (HCC)   Psoriatic arthropathy ([place])   Essential hypertension   Type 2 diabetes mellitus with diabetic polyneuropathy, with long-term current use of insulin (HCC)   Acute pyelonephritis   Hyponatremia   Hypokalemia   1. Sepsis due to pyelonephritis, complicated with gram negative bacteremia (present on admission). Will continue antibiotic therapy with Ceftriaxone, wbc at 5,3. t max 100.8 on 05/24 at 1500 hours. Blood culture and urine culture positive for E. Coli, will follow sensitivities and will repeat blood cultures. Will order renal US to rule out obstructive uropathy.   2. HypoNatremia and Hypokalemia. Serum Na at 134 from 132, will continue to replete K with kcl, follow renal panel in am, renal function preserved with cr at 0.97.   3. T2DM. Patient with new episode of  hypoglycemia, down to 53. Will reduce dose of long acting insulin from 30 to 15 and will continue glucose cover and monitoring with insulin sliding scale.   4. COPD. Chest film personally reviewed, noted chronic retraction of mediastinum to the right, post radiation changes. Will increase bronchodilator therapy frequency as needed and scheduled. Positive rhonchi on examination.   5. HTN. Blood pressure systolic 384 to 536, will continue close monitoring, holding amlodipine.   6. Depression. Continue duloxetine   7. Psoriatic arthritis. Continue sulfasalazine.    DVT prophylaxis: scd Code Status: Partial, no cpr, no intubation or defibrillation  Family Communication:  Disposition Plan: Home    Consultants:     Procedures:     Antimicrobials:   Ceftriaxone    Subjective: Patient feeling better, denies nausea or vomiting, no abdominal pain or urinary symptoms. Had urine infections in the past and follows with urology as outpatient.   Objective: Vitals:   03/23/17 1505 03/23/17 2112 03/24/17 0422 03/24/17 0720  BP: (!) 146/72 133/64 120/67   Pulse: (!) 106 97 88   Resp: 20 18 17    Temp: (!) 100.8 F (38.2 C) 98.7 F (37.1 C) 98.9 F (37.2 C)   TempSrc: Oral Oral    SpO2: 91% 91% 97% 98%    Intake/Output Summary (Last 24 hours) at 03/24/17 0848 Last data filed at 03/24/17 0422  Gross per 24 hour  Intake             1575 ml  Output                0 ml  Net  1575 ml   There were no vitals filed for this visit.  Examination:  General exam: deconditioned E ENT: mild pallor, no icterus, oral mucosa moist. Respiratory system: Bilateral scattered rhonchi, with bibasilar rales, no wheezing.   Cardiovascular system: S1 & S2 heard, RRR. No JVD, murmurs, rubs, gallops or clicks. No pedal edema. Gastrointestinal system: Abdomen is mildly distended, soft and nontender. No organomegaly or masses felt. Normal bowel sounds heard. Central nervous system: Alert and  oriented. No focal neurological deficits. Extremities: Symmetric 5 x 5 power. Skin: No rashes, lesions or ulcers      Data Reviewed: I have personally reviewed following labs and imaging studies  CBC:  Recent Labs Lab 03/22/17 1745 03/23/17 0310  WBC 7.8 5.3  NEUTROABS 6.4  --   HGB 10.8* 10.6*  HCT 33.8* 33.5*  MCV 88.3 89.1  PLT 92* 85*   Basic Metabolic Panel:  Recent Labs Lab 03/22/17 1745 03/22/17 2203 03/23/17 0310  NA 132*  --  134*  K 3.1*  --  3.1*  CL 100*  --  104  CO2 21*  --  22  GLUCOSE 257*  --  296*  BUN 11  --  12  CREATININE 0.93  --  0.97  CALCIUM 7.7*  --  7.5*  MG  --  1.8  --    GFR: CrCl cannot be calculated (Unknown ideal weight.). Liver Function Tests:  Recent Labs Lab 03/22/17 1745  AST 32  ALT 27  ALKPHOS 154*  BILITOT 1.5*  PROT 5.7*  ALBUMIN 2.8*   No results for input(s): LIPASE, AMYLASE in the last 168 hours. No results for input(s): AMMONIA in the last 168 hours. Coagulation Profile:  Recent Labs Lab 03/22/17 1745  INR 1.11   Cardiac Enzymes: No results for input(s): CKTOTAL, CKMB, CKMBINDEX, TROPONINI in the last 168 hours. BNP (last 3 results) No results for input(s): PROBNP in the last 8760 hours. HbA1C: No results for input(s): HGBA1C in the last 72 hours. CBG:  Recent Labs Lab 03/23/17 1206 03/23/17 1656 03/23/17 2111 03/24/17 0735 03/24/17 0803  GLUCAP 199* 179* 165* 55* 111*   Lipid Profile: No results for input(s): CHOL, HDL, LDLCALC, TRIG, CHOLHDL, LDLDIRECT in the last 72 hours. Thyroid Function Tests: No results for input(s): TSH, T4TOTAL, FREET4, T3FREE, THYROIDAB in the last 72 hours. Anemia Panel: No results for input(s): VITAMINB12, FOLATE, FERRITIN, TIBC, IRON, RETICCTPCT in the last 72 hours. Sepsis Labs:  Recent Labs Lab 03/22/17 1757 03/22/17 1954  LATICACIDVEN 0.80 0.57    Recent Results (from the past 240 hour(s))  Culture, blood (Routine x 2)     Status: None  (Preliminary result)   Collection Time: 03/22/17  5:51 PM  Result Value Ref Range Status   Specimen Description BLOOD BLOOD LEFT FOREARM  Final   Special Requests   Final    BOTTLES DRAWN AEROBIC AND ANAEROBIC Blood Culture adequate volume   Culture  Setup Time   Final    GRAM NEGATIVE RODS IN BOTH AEROBIC AND ANAEROBIC BOTTLES Organism ID to follow CRITICAL RESULT CALLED TO, READ BACK BY AND VERIFIED WITH: Karren Cobble 03/23/17 @ 22 M VESTAL    Culture GRAM NEGATIVE RODS  Final   Report Status PENDING  Incomplete  Blood Culture ID Panel (Reflexed)     Status: Abnormal   Collection Time: 03/22/17  5:51 PM  Result Value Ref Range Status   Enterococcus species NOT DETECTED NOT DETECTED Final   Listeria monocytogenes NOT DETECTED NOT DETECTED  Final   Staphylococcus species NOT DETECTED NOT DETECTED Final   Staphylococcus aureus NOT DETECTED NOT DETECTED Final   Streptococcus species NOT DETECTED NOT DETECTED Final   Streptococcus agalactiae NOT DETECTED NOT DETECTED Final   Streptococcus pneumoniae NOT DETECTED NOT DETECTED Final   Streptococcus pyogenes NOT DETECTED NOT DETECTED Final   Acinetobacter baumannii NOT DETECTED NOT DETECTED Final   Enterobacteriaceae species DETECTED (A) NOT DETECTED Final    Comment: Enterobacteriaceae represent a large family of gram-negative bacteria, not a single organism. CRITICAL RESULT CALLED TO, READ BACK BY AND VERIFIED WITH: Karren Cobble 03/23/17 @ 0846 M VESTAL    Enterobacter cloacae complex NOT DETECTED NOT DETECTED Final   Escherichia coli DETECTED (A) NOT DETECTED Final    Comment: CRITICAL RESULT CALLED TO, READ BACK BY AND VERIFIED WITH: Karren Cobble 03/23/17 @ 0846 M VESTAL    Klebsiella oxytoca NOT DETECTED NOT DETECTED Final   Klebsiella pneumoniae NOT DETECTED NOT DETECTED Final   Proteus species NOT DETECTED NOT DETECTED Final   Serratia marcescens NOT DETECTED NOT DETECTED Final   Carbapenem resistance NOT DETECTED NOT DETECTED Final    Haemophilus influenzae NOT DETECTED NOT DETECTED Final   Neisseria meningitidis NOT DETECTED NOT DETECTED Final   Pseudomonas aeruginosa NOT DETECTED NOT DETECTED Final   Candida albicans NOT DETECTED NOT DETECTED Final   Candida glabrata NOT DETECTED NOT DETECTED Final   Candida krusei NOT DETECTED NOT DETECTED Final   Candida parapsilosis NOT DETECTED NOT DETECTED Final   Candida tropicalis NOT DETECTED NOT DETECTED Final  Culture, blood (Routine x 2)     Status: None (Preliminary result)   Collection Time: 03/22/17  6:10 PM  Result Value Ref Range Status   Specimen Description BLOOD RIGHT ANTECUBITAL  Final   Special Requests   Final    BOTTLES DRAWN AEROBIC AND ANAEROBIC Blood Culture adequate volume   Culture  Setup Time   Final    GRAM NEGATIVE RODS IN BOTH AEROBIC AND ANAEROBIC BOTTLES CRITICAL RESULT CALLED TO, READ BACK BY AND VERIFIED WITH: Karren Cobble 03/23/17 @ 0846 M VESTAL    Culture GRAM NEGATIVE RODS  Final   Report Status PENDING  Incomplete         Radiology Studies: Dg Chest 2 View  Result Date: 03/22/2017 CLINICAL DATA:  Short of breath EXAM: CHEST  2 VIEW COMPARISON:  02/02/2017 FINDINGS: Volume loss on the right as before. Left lung is clear. Stable enlarged cardiomediastinal silhouette. No pneumothorax. IMPRESSION: Volume loss on the right with shift of mediastinal contents to the right. This finding is unchanged. No definite acute interval change compared to prior. Electronically Signed   By: Donavan Foil M.D.   On: 03/22/2017 18:38        Scheduled Meds: . DULoxetine  60 mg Oral BID  . insulin aspart  0-15 Units Subcutaneous TID WC  . insulin aspart  0-5 Units Subcutaneous QHS  . insulin detemir  30 Units Subcutaneous Q2200  . mometasone-formoterol  2 puff Inhalation BID  . pregabalin  150 mg Oral BID  . sulfaSALAzine  1,000 mg Oral BID   Continuous Infusions: . 0.9 % NaCl with KCl 20 mEq / L 75 mL/hr at 03/22/17 2127  . cefTRIAXone (ROCEPHIN)  IV  Stopped (03/23/17 1829)     LOS: 1 day       Mauricio Gerome Apley, MD [facility] Pager [phone]  If 7PM-7AM, please contact night-coverage [email or web address] Password [facility] 03/24/2017, 8:48 AM

## 2017-03-25 LAB — CBC WITH DIFFERENTIAL/PLATELET
BASOS ABS: 0 10*3/uL (ref 0.0–0.1)
Basophils Relative: 1 %
Eosinophils Absolute: 0 10*3/uL (ref 0.0–0.7)
Eosinophils Relative: 1 %
HCT: 33.5 % — ABNORMAL LOW (ref 36.0–46.0)
Hemoglobin: 10.7 g/dL — ABNORMAL LOW (ref 12.0–15.0)
Lymphocytes Relative: 12 %
Lymphs Abs: 0.8 10*3/uL (ref 0.7–4.0)
MCH: 28 pg (ref 26.0–34.0)
MCHC: 31.9 g/dL (ref 30.0–36.0)
MCV: 87.7 fL (ref 78.0–100.0)
MONO ABS: 0.7 10*3/uL (ref 0.1–1.0)
Monocytes Relative: 10 %
Neutro Abs: 5.2 10*3/uL (ref 1.7–7.7)
Neutrophils Relative %: 77 %
PLATELETS: 108 10*3/uL — AB (ref 150–400)
RBC: 3.82 MIL/uL — AB (ref 3.87–5.11)
RDW: 17.4 % — AB (ref 11.5–15.5)
WBC: 6.8 10*3/uL (ref 4.0–10.5)

## 2017-03-25 LAB — URINE CULTURE

## 2017-03-25 LAB — CULTURE, BLOOD (ROUTINE X 2)
Special Requests: ADEQUATE
Special Requests: ADEQUATE

## 2017-03-25 LAB — BASIC METABOLIC PANEL
ANION GAP: 7 (ref 5–15)
BUN: 7 mg/dL (ref 6–20)
CALCIUM: 8 mg/dL — AB (ref 8.9–10.3)
CO2: 24 mmol/L (ref 22–32)
Chloride: 103 mmol/L (ref 101–111)
Creatinine, Ser: 0.76 mg/dL (ref 0.44–1.00)
GFR calc Af Amer: >60 mL/min (ref >60)
Glucose, Bld: 198 mg/dL — ABNORMAL HIGH (ref 65–99)
POTASSIUM: 3.5 mmol/L (ref 3.5–5.1)
Sodium: 134 mmol/L — ABNORMAL LOW (ref 135–145)

## 2017-03-25 LAB — GLUCOSE, CAPILLARY
GLUCOSE-CAPILLARY: 158 mg/dL — AB (ref 65–99)
Glucose-Capillary: 197 mg/dL — ABNORMAL HIGH (ref 65–99)

## 2017-03-25 MED ORDER — CIPROFLOXACIN HCL 250 MG PO TABS
250.0000 mg | ORAL_TABLET | Freq: Two times a day (BID) | ORAL | Status: DC
Start: 2017-03-25 — End: 2017-03-25
  Administered 2017-03-25: 250 mg via ORAL
  Filled 2017-03-25: qty 1

## 2017-03-25 MED ORDER — IPRATROPIUM-ALBUTEROL 0.5-2.5 (3) MG/3ML IN SOLN: 3.0000 mL | RESPIRATORY_TRACT | Status: DC | PRN

## 2017-03-25 MED ORDER — IPRATROPIUM-ALBUTEROL 0.5-2.5 (3) MG/3ML IN SOLN
3.0000 mL | Freq: Four times a day (QID) | RESPIRATORY_TRACT | Status: DC
  Administered 2017-03-25: 3 mL via RESPIRATORY_TRACT
  Filled 2017-03-25: qty 3

## 2017-03-25 MED ORDER — CIPROFLOXACIN HCL 250 MG PO TABS: 250.0000 mg | ORAL_TABLET | Freq: Two times a day (BID) | ORAL | 0 refills | Status: DC

## 2017-03-25 NOTE — Evaluation (Signed)
Physical Therapy Evaluation Patient Details Name: Megan [name] MRN: [id number] DOB: [birth year] Today's Date: 03/25/2017   History of Present Illness   Megan [name] is a 76 y.o. female with a past medical history significant for Psoriatic arthritis on sulfasalazine, HTN, IDDM, recent SAH, emphysema and lung CA stage IIIB stable not on chemo, and thrombocytopenia who presents with fever, weakness, and confusion.  Clinical Impression  Pt appears to be near baseline. Pt with noted balance deficits and reports she attends a balance class 2x/wk at [facility]. Recommend pt go back to using a cane and to keep attending balance class. Acute PT to follow.    Follow Up Recommendations No PT follow up;Supervision/Assistance - 24 hour ( cont going to balance class at outpt Neuro)    Equipment Recommendations  None recommended by PT    Recommendations for Other Services       Precautions / Restrictions Precautions Precautions: Fall Restrictions Weight Bearing Restrictions: No      Mobility  Bed Mobility Overal bed mobility: Modified Independent             General bed mobility comments: used bed rail  Transfers Overall transfer level: Needs assistance Equipment used: None Transfers: Sit to/from Stand Sit to Stand: Supervision         General transfer comment: increased time but no difficulty  Ambulation/Gait Ambulation/Gait assistance: Min guard Ambulation Distance (Feet): 150 Feet Assistive device: None Gait Pattern/deviations: Step-through pattern;Decreased stride length;Shuffle Gait velocity: slow Gait velocity interpretation: Below normal speed for age/gender General Gait Details: pt inconsistently picked up feet, R LE worse than the R, pt with 1 episode of LOB but able to recover without assist. mild DOE, pt reports this to be normal due to her lung cancer  Stairs            Wheelchair Mobility    Modified Rankin (Stroke Patients Only)        Balance Overall balance assessment: History of Falls                                           Pertinent Vitals/Pain Pain Assessment: No/denies pain    Home Living Family/patient expects to be discharged to:: Private residence Living Arrangements: Other (Comment) (friend named [name]) Available Help at Discharge: Friend(s) Type of Home: House Home Access: Stairs to enter Entrance Stairs-Rails: None Entrance Stairs-Number of Steps: 1 Home Layout: One level Home Equipment: Environmental consultant - 2 wheels;Walker - 4 wheels;Cane - single point;Shower seat;Grab bars - toilet;Grab bars - tub/shower;Hand held shower head      Prior Function Level of Independence: Independent               Hand Dominance   Dominant Hand: Right    Extremity/Trunk Assessment   Upper Extremity Assessment Upper Extremity Assessment: Generalized weakness    Lower Extremity Assessment Lower Extremity Assessment: Generalized weakness (grossly 4-/5 bilat LE, h/o bilat broken legs)    Cervical / Trunk Assessment Cervical / Trunk Assessment: Kyphotic  Communication   Communication: No difficulties  Cognition Arousal/Alertness: Awake/alert Behavior During Therapy: WFL for tasks assessed/performed Overall Cognitive Status: Within Functional Limits for tasks assessed  General Comments      Exercises     Assessment/Plan    PT Assessment Patient needs continued PT services  PT Problem List Decreased strength;Decreased activity tolerance;Decreased balance;Decreased mobility       PT Treatment Interventions DME instruction;Gait training;Stair training;Functional mobility training;Therapeutic activities;Therapeutic exercise;Balance training    PT Goals (Current goals can be found in the Care Plan section)  Acute Rehab PT Goals Patient Stated Goal: home toda PT Goal Formulation: With patient Time For Goal Achievement:  04/01/17 Potential to Achieve Goals: Good Additional Goals Additional Goal #1: Pt to score >19 on DGI to indicate minimal falls risk.    Frequency Min 3X/week   Barriers to discharge        Co-evaluation               AM-PAC PT "6 Clicks" Daily Activity  Outcome Measure Difficulty turning over in bed (including adjusting bedclothes, sheets and blankets)?: None Difficulty moving from lying on back to sitting on the side of the bed? : A Little Difficulty sitting down on and standing up from a chair with arms (e.g., wheelchair, bedside commode, etc,.)?: A Little Help needed moving to and from a bed to chair (including a wheelchair)?: A Little Help needed walking in hospital room?: A Little Help needed climbing 3-5 steps with a railing? : A Little 6 Click Score: 19    End of Session Equipment Utilized During Treatment: Gait belt Activity Tolerance: Patient tolerated treatment well Patient left: in bed;with call bell/phone within reach Nurse Communication: Mobility status PT Visit Diagnosis: Unsteadiness on feet (R26.81);Muscle weakness (generalized) (M62.81)    Time: 8416-6063 PT Time Calculation (min) (ACUTE ONLY): 12 min   Charges:   PT Evaluation $PT Eval Low Complexity: 1 Procedure     PT G CodesKittie Plater, PT, DPT Pager #: [phone] Office #: [phone]   [place] 03/25/2017, 1:29 PM

## 2017-03-25 NOTE — Discharge Summary (Signed)
Physician Discharge Summary  Megan [name] TGG:[id number] DOB: [birth year] DOA: 03/22/2017  PCP: Binnie Rail, MD  Admit date: 03/22/2017 Discharge date: 03/25/2017  Admitted From: Home  Disposition:  Home   Recommendations for Outpatient Follow-up:  1. Follow up with PCP in 1- week 2. Patient placed on ciprofloxacin to complete 10 days, to complete on June 2nd.  Home Health: No  Equipment/Devices: No   Discharge Condition: Stable  CODE STATUS: Full  Diet recommendation: Heart Healthy / Carb Modified   Brief/Interim Summary: 76 yo female presents with the chief complain of weakness, confusion and fever. Patient known to have HTN, T2DM, recent SAH, emphysema, stage IIIB lung cancer. Worsening weakness for the last 3 days before admission, associated with increased urinary frequency. On the initial physical examination, she was septic with temperature of 102.6, heart rate 108, respiratory rate 28 and blood pressure 149/63. Her lungs were clear to auscultation, heart S1 and S2 rhythmic and tachycardic, abdomen soft and non tender, lower extremity with no edema. Urine analysis positive for infection. Sodium 132, potassium 3.1, chloride 100, bicarbonate 21, glucose 257, BUN 11, creatinine 0.93, calcium 7.7, AST 32, ALT 27, white count 7.8, Hb10.8, hematocrit 33.8, platelets 92. Lactic acid 0.8. Chest x-ray with mediastinal deviation of the right side, chronic. EKG with normal sinus rhythm, positive LVH per EKG criteria.    Patient admitted with the working diagnosis of sepsis due to pyelonephritis, complicated by thrombo-cytopenia, hyponatremia and hypokalemia.  1. Sepsis due to pyelonephritis complicated by gram-negative bacteremia, Escherichia coli, present on admission. The patient was admitted to the medical unit with a remote telemetry monitor, she was started on antibiotic therapy with IV ceftriaxone. Urine and blood culture came back positive Escherichia coli which was resistant to  Bactrim but sensitive to cephalosporins and fluoroquinolones. Patient was successfully transitioned to oral ciprofloxacin, to complete therapy June 2nd. As part of the workup renal ultrasonography was normal.   2. Hyponatremia and hypokalemia. Electrolytes were corrected, patient received diastolic saline and potassium chloride. Patient is tolerating well by mouth diet. Her discharge potassium is 3.5, sodium 134. Renal function remained preserved with a creatinine of 0.76.  3. Type 2 diabetes mellitus. Patient was continued on insulin therapy, had basal long-acting insulin and developed one  episode of hypoglycemia. Currently patient tolerated po well. Plan to resume home insulin regimen, that dose not include long acting insulin. Capillary glucose 208, 98, 230, 158.   4. COPD, no signs of acute exacerbation. Chest film personal review, mediastinum retractor the right side with loss of lung volume. Since be chronic and a result of radiation therapy. Positive pleural effusion seen on renal ultrasonography. Patient received bronchodilator therapy while in the hospital.  5. Hypertension. Blood pressure remained well-controlled, antihypertensive agents were held due to risk of hypotension, by the time of discharge she will resume amlodipine. Discharge systolic blood pressure 703 to 150 mmHg.   6. Depression. Continue duloxetine, no agitation or confusion.  7. Psoriatic arthritis. Continue sulfasalazine.   8. Thrombocytopenia. Consider to be reactive, discharge platelet count 108. [name] Discharge Diagnoses:  Principal Problem:   Altered mental status Active Problems:   Malignant neoplasm of right upper lobe of lung (HCC)   Cancer of middle lobe of lung (HCC)   Thrombocytopenia, unspecified (HCC)   Emphysema of lung (HCC)   Psoriatic arthropathy ([place])   Essential hypertension   Type 2 diabetes mellitus with diabetic polyneuropathy, with long-term current use of insulin (HCC)   Acute pyelonephritis  Hyponatremia   Hypokalemia    Discharge Instructions   Allergies as of 03/25/2017      Reactions   Carboplatin Other (See Comments)   Neuropathy   Remicade [infliximab] Anaphylaxis   Hydromorphone Rash      Medication List    STOP taking these medications   LEVEMIR FLEXTOUCH 100 UNIT/ML Pen Generic drug:  Insulin Detemir     TAKE these medications   amLODipine 2.5 MG tablet Commonly known as:  NORVASC Take 1 tablet (2.5 mg total) by mouth daily.   augmented betamethasone dipropionate 0.05 % ointment Commonly known as:  DIPROLENE-AF APP AA BID PRN   ciprofloxacin 250 MG tablet Commonly known as:  CIPRO Take 1 tablet (250 mg total) by mouth 2 (two) times daily.   DULoxetine 60 MG capsule Commonly known as:  CYMBALTA Take 1 capsule (60 mg total) by mouth 2 (two) times daily.   insulin lispro 100 UNIT/ML KiwkPen Commonly known as:  HUMALOG KWIKPEN INJECT 8-20 units 3x a day under skin What changed:  how much to take  how to take this  when to take this  additional instructions   oxyCODONE-acetaminophen 5-325 MG tablet Commonly known as:  PERCOCET/ROXICET TAKE 1 TABLET BY MOUTH EVERY4-6 HOURS AS NEEDED FOR PAIN.   pregabalin 150 MG capsule Commonly known as:  LYRICA Take 1 capsule (150 mg total) by mouth 2 (two) times daily.   sulfaSALAzine 500 MG EC tablet Commonly known as:  AZULFIDINE Take 2 tablets (1,000 mg total) by mouth 2 (two) times daily.       Allergies  Allergen Reactions  . Carboplatin Other (See Comments)    Neuropathy  . Remicade [Infliximab] Anaphylaxis  . Hydromorphone Rash    Consultations:     Procedures/Studies: Dg Chest 2 View  Result Date: 03/22/2017 CLINICAL DATA:  Short of breath EXAM: CHEST  2 VIEW COMPARISON:  02/02/2017 FINDINGS: Volume loss on the right as before. Left lung is clear. Stable enlarged cardiomediastinal silhouette. No pneumothorax. IMPRESSION: Volume loss on the right with shift of mediastinal  contents to the right. This finding is unchanged. No definite acute interval change compared to prior. Electronically Signed   By: Donavan Foil M.D.   On: 03/22/2017 18:38   US Renal  Result Date: 03/24/2017 CLINICAL DATA:  76 year old female with pyelonephritis. Treated lung cancer. EXAM: RENAL / URINARY TRACT ULTRASOUND COMPLETE COMPARISON:  Chest CT 02/02/2017. CT Abdomen and Pelvis without contrast 02/04/2016. FINDINGS: Right Kidney: Length: 10.8 cm.  The Left Kidney: Length: 11.5 cm. Echogenicity within normal limits. No mass or hydronephrosis visualized. Bladder: Appears normal for degree of bladder distention. Other findings: At least moderate size right pleural effusion (image 4). Much smaller appearing left pleural effusion (image 23). IMPRESSION: 1. Normal ultrasound appearance of both kidneys and the urinary bladder. 2. Right greater than left pleural effusions are new since the April chest CT. Electronically Signed   By: Genevie Ann M.D.   On: 03/24/2017 11:40      Subjective: Patient feeling well, no nausea or vomiting, no chest pain or dyspnea. No abdominal pain, out of bed as tolerated.   Discharge Exam: Vitals:   03/24/17 2200 03/25/17 0501  BP: (!) 153/81 137/63  Pulse: (!) 10 99  Resp: 16 16  Temp: 98.8 F (37.1 C) 99.1 F (37.3 C)   Vitals:   03/24/17 1606 03/24/17 2025 03/24/17 2200 03/25/17 0501  BP:   (!) 153/81 137/63  Pulse:   (!) 10 99  Resp:  16 16  Temp:   98.8 F (37.1 C) 99.1 F (37.3 C)  TempSrc:   Oral   SpO2: (!) 89% 92% 91% 92%    General: Pt is alert, awake, not in acute distress E ENT. No pallor or icterus. Oral mucosa moist.  Cardiovascular: RRR, S1/S2 +, no rubs, no gallops Respiratory: CTA bilaterally, no wheezing, scattered rhonchi Abdominal: Soft, NT, ND, bowel sounds + Extremities: no edema, no cyanosis    The results of significant diagnostics from this hospitalization (including imaging, microbiology, ancillary and laboratory) are  listed below for reference.     Microbiology: Recent Results (from the past 240 hour(s))  Culture, blood (Routine x 2)     Status: Abnormal   Collection Time: 03/22/17  5:51 PM  Result Value Ref Range Status   Specimen Description BLOOD BLOOD LEFT FOREARM  Final   Special Requests   Final    BOTTLES DRAWN AEROBIC AND ANAEROBIC Blood Culture adequate volume   Culture  Setup Time   Final    GRAM NEGATIVE RODS IN BOTH AEROBIC AND ANAEROBIC BOTTLES CRITICAL RESULT CALLED TO, READ BACK BY AND VERIFIED WITH: Karren Cobble 03/23/17 @ 0846 M VESTAL    Culture ESCHERICHIA COLI (A)  Final   Report Status 03/25/2017 FINAL  Final   Organism ID, Bacteria ESCHERICHIA COLI  Final      Susceptibility   Escherichia coli - MIC*    AMPICILLIN <=2 SENSITIVE Sensitive     CEFAZOLIN <=4 SENSITIVE Sensitive     CEFEPIME <=1 SENSITIVE Sensitive     CEFTAZIDIME <=1 SENSITIVE Sensitive     CEFTRIAXONE <=1 SENSITIVE Sensitive     CIPROFLOXACIN <=0.25 SENSITIVE Sensitive     GENTAMICIN <=1 SENSITIVE Sensitive     IMIPENEM <=0.25 SENSITIVE Sensitive     TRIMETH/SULFA >=320 RESISTANT Resistant     AMPICILLIN/SULBACTAM <=2 SENSITIVE Sensitive     PIP/TAZO <=4 SENSITIVE Sensitive     Extended ESBL NEGATIVE Sensitive     * ESCHERICHIA COLI  Blood Culture ID Panel (Reflexed)     Status: Abnormal   Collection Time: 03/22/17  5:51 PM  Result Value Ref Range Status   Enterococcus species NOT DETECTED NOT DETECTED Final   Listeria monocytogenes NOT DETECTED NOT DETECTED Final   Staphylococcus species NOT DETECTED NOT DETECTED Final   Staphylococcus aureus NOT DETECTED NOT DETECTED Final   Streptococcus species NOT DETECTED NOT DETECTED Final   Streptococcus agalactiae NOT DETECTED NOT DETECTED Final   Streptococcus pneumoniae NOT DETECTED NOT DETECTED Final   Streptococcus pyogenes NOT DETECTED NOT DETECTED Final   Acinetobacter baumannii NOT DETECTED NOT DETECTED Final   Enterobacteriaceae species DETECTED (A)  NOT DETECTED Final    Comment: Enterobacteriaceae represent a large family of gram-negative bacteria, not a single organism. CRITICAL RESULT CALLED TO, READ BACK BY AND VERIFIED WITH: Karren Cobble 03/23/17 @ 0846 M VESTAL    Enterobacter cloacae complex NOT DETECTED NOT DETECTED Final   Escherichia coli DETECTED (A) NOT DETECTED Final    Comment: CRITICAL RESULT CALLED TO, READ BACK BY AND VERIFIED WITH: Karren Cobble 03/23/17 @ 0846 M VESTAL    Klebsiella oxytoca NOT DETECTED NOT DETECTED Final   Klebsiella pneumoniae NOT DETECTED NOT DETECTED Final   Proteus species NOT DETECTED NOT DETECTED Final   Serratia marcescens NOT DETECTED NOT DETECTED Final   Carbapenem resistance NOT DETECTED NOT DETECTED Final   Haemophilus influenzae NOT DETECTED NOT DETECTED Final   Neisseria meningitidis NOT DETECTED NOT DETECTED Final  Pseudomonas aeruginosa NOT DETECTED NOT DETECTED Final   Candida albicans NOT DETECTED NOT DETECTED Final   Candida glabrata NOT DETECTED NOT DETECTED Final   Candida krusei NOT DETECTED NOT DETECTED Final   Candida parapsilosis NOT DETECTED NOT DETECTED Final   Candida tropicalis NOT DETECTED NOT DETECTED Final  Culture, blood (Routine x 2)     Status: Abnormal   Collection Time: 03/22/17  6:10 PM  Result Value Ref Range Status   Specimen Description BLOOD RIGHT ANTECUBITAL  Final   Special Requests   Final    BOTTLES DRAWN AEROBIC AND ANAEROBIC Blood Culture adequate volume   Culture  Setup Time   Final    GRAM NEGATIVE RODS IN BOTH AEROBIC AND ANAEROBIC BOTTLES CRITICAL RESULT CALLED TO, READ BACK BY AND VERIFIED WITH: Karren Cobble 03/23/17 @ 0846 M VESTAL    Culture (A)  Final    ESCHERICHIA COLI SUSCEPTIBILITIES PERFORMED ON PREVIOUS CULTURE WITHIN THE LAST 5 DAYS.    Report Status 03/25/2017 FINAL  Final  Culture, Urine     Status: Abnormal   Collection Time: 03/22/17  6:50 PM  Result Value Ref Range Status   Specimen Description URINE, RANDOM  Final   Special  Requests NONE  Final   Culture >=100,000 COLONIES/mL ESCHERICHIA COLI (A)  Final   Report Status 03/25/2017 FINAL  Final   Organism ID, Bacteria ESCHERICHIA COLI (A)  Final      Susceptibility   Escherichia coli - MIC*    AMPICILLIN <=2 SENSITIVE Sensitive     CEFAZOLIN <=4 SENSITIVE Sensitive     CEFTRIAXONE <=1 SENSITIVE Sensitive     CIPROFLOXACIN <=0.25 SENSITIVE Sensitive     GENTAMICIN <=1 SENSITIVE Sensitive     IMIPENEM <=0.25 SENSITIVE Sensitive     NITROFURANTOIN <=16 SENSITIVE Sensitive     TRIMETH/SULFA >=320 RESISTANT Resistant     AMPICILLIN/SULBACTAM <=2 SENSITIVE Sensitive     PIP/TAZO <=4 SENSITIVE Sensitive     Extended ESBL NEGATIVE Sensitive     * >=100,000 COLONIES/mL ESCHERICHIA COLI     Labs: BNP (last 3 results) No results for input(s): BNP in the last 8760 hours. Basic Metabolic Panel:  Recent Labs Lab 03/22/17 1745 03/22/17 2203 03/23/17 0310 03/24/17 1425 03/25/17 0500  NA 132*  --  134* 136 134*  K 3.1*  --  3.1* 3.5 3.5  CL 100*  --  104 105 103  CO2 21*  --  22 24 24   GLUCOSE 257*  --  296* 143* 198*  BUN 11  --  12 6 7   CREATININE 0.93  --  0.97 0.75 0.76  CALCIUM 7.7*  --  7.5* 7.9* 8.0*  MG  --  1.8  --   --   --    Liver Function Tests:  Recent Labs Lab 03/22/17 1745  AST 32  ALT 27  ALKPHOS 154*  BILITOT 1.5*  PROT 5.7*  ALBUMIN 2.8*   No results for input(s): LIPASE, AMYLASE in the last 168 hours. No results for input(s): AMMONIA in the last 168 hours. CBC:  Recent Labs Lab 03/22/17 1745 03/23/17 0310 03/24/17 1425 03/25/17 0500  WBC 7.8 5.3 7.2 6.8  NEUTROABS 6.4  --  5.5 5.2  HGB 10.8* 10.6* 11.2* 10.7*  HCT 33.8* 33.5* 35.3* 33.5*  MCV 88.3 89.1 87.8 87.7  PLT 92* 85* 115* 108*   Cardiac Enzymes: No results for input(s): CKTOTAL, CKMB, CKMBINDEX, TROPONINI in the last 168 hours. BNP: Invalid input(s): POCBNP CBG:  Recent  Labs Lab 03/24/17 0803 03/24/17 1220 03/24/17 1656 03/24/17 2204  03/25/17 0759  GLUCAP 111* 208* 98 230* 158*   D-Dimer No results for input(s): DDIMER in the last 72 hours. Hgb A1c No results for input(s): HGBA1C in the last 72 hours. Lipid Profile No results for input(s): CHOL, HDL, LDLCALC, TRIG, CHOLHDL, LDLDIRECT in the last 72 hours. Thyroid function studies No results for input(s): TSH, T4TOTAL, T3FREE, THYROIDAB in the last 72 hours.  Invalid input(s): FREET3 Anemia work up No results for input(s): VITAMINB12, FOLATE, FERRITIN, TIBC, IRON, RETICCTPCT in the last 72 hours. Urinalysis    Component Value Date/Time   COLORURINE AMBER (A) 03/22/2017 1850   APPEARANCEUR HAZY (A) 03/22/2017 1850   LABSPEC 1.014 03/22/2017 1850   LABSPEC 1.030 08/25/2015 1130   PHURINE 6.0 03/22/2017 1850   GLUCOSEU >=500 (A) 03/22/2017 1850   GLUCOSEU Negative 08/25/2015 1130   HGBUR SMALL (A) 03/22/2017 1850   BILIRUBINUR NEGATIVE 03/22/2017 1850   BILIRUBINUR Negative 08/25/2015 1130   KETONESUR 80 (A) 03/22/2017 1850   PROTEINUR 100 (A) 03/22/2017 1850   UROBILINOGEN 0.2 08/25/2015 1130   NITRITE POSITIVE (A) 03/22/2017 1850   LEUKOCYTESUR MODERATE (A) 03/22/2017 1850   LEUKOCYTESUR Negative 08/25/2015 1130   Sepsis Labs Invalid input(s): PROCALCITONIN,  WBC,  LACTICIDVEN Microbiology Recent Results (from the past 240 hour(s))  Culture, blood (Routine x 2)     Status: Abnormal   Collection Time: 03/22/17  5:51 PM  Result Value Ref Range Status   Specimen Description BLOOD BLOOD LEFT FOREARM  Final   Special Requests   Final    BOTTLES DRAWN AEROBIC AND ANAEROBIC Blood Culture adequate volume   Culture  Setup Time   Final    GRAM NEGATIVE RODS IN BOTH AEROBIC AND ANAEROBIC BOTTLES CRITICAL RESULT CALLED TO, READ BACK BY AND VERIFIED WITH: Karren Cobble 03/23/17 @ 0846 M VESTAL    Culture ESCHERICHIA COLI (A)  Final   Report Status 03/25/2017 FINAL  Final   Organism ID, Bacteria ESCHERICHIA COLI  Final      Susceptibility   Escherichia coli -  MIC*    AMPICILLIN <=2 SENSITIVE Sensitive     CEFAZOLIN <=4 SENSITIVE Sensitive     CEFEPIME <=1 SENSITIVE Sensitive     CEFTAZIDIME <=1 SENSITIVE Sensitive     CEFTRIAXONE <=1 SENSITIVE Sensitive     CIPROFLOXACIN <=0.25 SENSITIVE Sensitive     GENTAMICIN <=1 SENSITIVE Sensitive     IMIPENEM <=0.25 SENSITIVE Sensitive     TRIMETH/SULFA >=320 RESISTANT Resistant     AMPICILLIN/SULBACTAM <=2 SENSITIVE Sensitive     PIP/TAZO <=4 SENSITIVE Sensitive     Extended ESBL NEGATIVE Sensitive     * ESCHERICHIA COLI  Blood Culture ID Panel (Reflexed)     Status: Abnormal   Collection Time: 03/22/17  5:51 PM  Result Value Ref Range Status   Enterococcus species NOT DETECTED NOT DETECTED Final   Listeria monocytogenes NOT DETECTED NOT DETECTED Final   Staphylococcus species NOT DETECTED NOT DETECTED Final   Staphylococcus aureus NOT DETECTED NOT DETECTED Final   Streptococcus species NOT DETECTED NOT DETECTED Final   Streptococcus agalactiae NOT DETECTED NOT DETECTED Final   Streptococcus pneumoniae NOT DETECTED NOT DETECTED Final   Streptococcus pyogenes NOT DETECTED NOT DETECTED Final   Acinetobacter baumannii NOT DETECTED NOT DETECTED Final   Enterobacteriaceae species DETECTED (A) NOT DETECTED Final    Comment: Enterobacteriaceae represent a large family of gram-negative bacteria, not a single organism. CRITICAL RESULT CALLED TO,  READ BACK BY AND VERIFIED WITH: Karren Cobble 03/23/17 @ 0846 M VESTAL    Enterobacter cloacae complex NOT DETECTED NOT DETECTED Final   Escherichia coli DETECTED (A) NOT DETECTED Final    Comment: CRITICAL RESULT CALLED TO, READ BACK BY AND VERIFIED WITH: Karren Cobble 03/23/17 @ 0846 M VESTAL    Klebsiella oxytoca NOT DETECTED NOT DETECTED Final   Klebsiella pneumoniae NOT DETECTED NOT DETECTED Final   Proteus species NOT DETECTED NOT DETECTED Final   Serratia marcescens NOT DETECTED NOT DETECTED Final   Carbapenem resistance NOT DETECTED NOT DETECTED Final    Haemophilus influenzae NOT DETECTED NOT DETECTED Final   Neisseria meningitidis NOT DETECTED NOT DETECTED Final   Pseudomonas aeruginosa NOT DETECTED NOT DETECTED Final   Candida albicans NOT DETECTED NOT DETECTED Final   Candida glabrata NOT DETECTED NOT DETECTED Final   Candida krusei NOT DETECTED NOT DETECTED Final   Candida parapsilosis NOT DETECTED NOT DETECTED Final   Candida tropicalis NOT DETECTED NOT DETECTED Final  Culture, blood (Routine x 2)     Status: Abnormal   Collection Time: 03/22/17  6:10 PM  Result Value Ref Range Status   Specimen Description BLOOD RIGHT ANTECUBITAL  Final   Special Requests   Final    BOTTLES DRAWN AEROBIC AND ANAEROBIC Blood Culture adequate volume   Culture  Setup Time   Final    GRAM NEGATIVE RODS IN BOTH AEROBIC AND ANAEROBIC BOTTLES CRITICAL RESULT CALLED TO, READ BACK BY AND VERIFIED WITH: Karren Cobble 03/23/17 @ 0846 M VESTAL    Culture (A)  Final    ESCHERICHIA COLI SUSCEPTIBILITIES PERFORMED ON PREVIOUS CULTURE WITHIN THE LAST 5 DAYS.    Report Status 03/25/2017 FINAL  Final  Culture, Urine     Status: Abnormal   Collection Time: 03/22/17  6:50 PM  Result Value Ref Range Status   Specimen Description URINE, RANDOM  Final   Special Requests NONE  Final   Culture >=100,000 COLONIES/mL ESCHERICHIA COLI (A)  Final   Report Status 03/25/2017 FINAL  Final   Organism ID, Bacteria ESCHERICHIA COLI (A)  Final      Susceptibility   Escherichia coli - MIC*    AMPICILLIN <=2 SENSITIVE Sensitive     CEFAZOLIN <=4 SENSITIVE Sensitive     CEFTRIAXONE <=1 SENSITIVE Sensitive     CIPROFLOXACIN <=0.25 SENSITIVE Sensitive     GENTAMICIN <=1 SENSITIVE Sensitive     IMIPENEM <=0.25 SENSITIVE Sensitive     NITROFURANTOIN <=16 SENSITIVE Sensitive     TRIMETH/SULFA >=320 RESISTANT Resistant     AMPICILLIN/SULBACTAM <=2 SENSITIVE Sensitive     PIP/TAZO <=4 SENSITIVE Sensitive     Extended ESBL NEGATIVE Sensitive     * >=100,000 COLONIES/mL ESCHERICHIA  COLI     Time coordinating discharge: 45 minutes  SIGNED:   Tawni Millers, MD  [facility] 03/25/2017, 10:29 AM Pager   If 7PM-7AM, please contact night-coverage [email or web address] Password [email or web address]

## 2017-03-25 NOTE — Progress Notes (Signed)
Pt is happy to go home. Ambulated to the hall with PT, steady gait. Verbalized of feeling a lot better. Discharge instructions given, discharged to home with son.

## 2017-03-28 ENCOUNTER — Ambulatory Visit: Payer: Medicare Other

## 2017-03-28 VITALS — BP 144/88 | HR 111

## 2017-03-28 DIAGNOSIS — R2689 Other abnormalities of gait and mobility: Secondary | ICD-10-CM

## 2017-03-28 DIAGNOSIS — M25671 Stiffness of right ankle, not elsewhere classified: Secondary | ICD-10-CM | POA: Diagnosis present

## 2017-03-28 DIAGNOSIS — M6281 Muscle weakness (generalized): Secondary | ICD-10-CM | POA: Diagnosis present

## 2017-03-28 DIAGNOSIS — R208 Other disturbances of skin sensation: Secondary | ICD-10-CM | POA: Diagnosis present

## 2017-03-28 NOTE — Therapy (Addendum)
[facility] [address] [place], [place], [address] Phone: [phone]   Fax:  [phone]  Physical Therapy Treatment  Patient Details  Name: Megan [name] MRN: [id number] Date of Birth: 1941/08/20 Referring Provider: Dr. Quay Burow  Encounter Date: 03/28/2017      PT End of Session - 03/28/17 1458    Visit Number 6   Number of Visits 17   Date for PT Re-Evaluation 04/30/17   Authorization Type Medicare: G-CODE AND PROGRESS NOTE EVERY 10TH VISIT.   PT Start Time 1413   PT Stop Time 1445  pt requested to finish early 2/2 fatigue   PT Time Calculation (min) 32 min   Equipment Utilized During Treatment --  min guard to S prn   Activity Tolerance Patient tolerated treatment well;Patient limited by fatigue   Behavior During Therapy WFL for tasks assessed/performed      Past Medical History:  Diagnosis Date  . Cancer ([place]) dx'd 09/2013   Lung ca  . Diabetes ([place]) 09/08/2016   type I diabetic patient reported on 09/13/16   . Diabetes mellitus    insulin  . Diabetic neuropathy ([place]) 09/08/2016  . GERD (gastroesophageal reflux disease)    no meds for  . History of hiatal hernia   . Hx of radiation therapy 12/16/13-01/30/14   lung 66Gy  . Hypertension   . Malignant neoplasm of right upper lobe of lung ([place]) 11/28/2013  . Neuropathy   . Psoriasis   . Psoriatic arthritis ([place]) 09/08/2016  . Rheumatoid arthritis ([place])   . Shortness of breath dyspnea    with exertion    Past Surgical History:  Procedure Laterality Date  . ABDOMINAL HYSTERECTOMY    . ABDOMINAL SURGERY    . BALLOON DILATION N/A 12/12/2014   Procedure: BALLOON DILATION;  Surgeon: Inda Castle, MD;  Location: [facility];  Service: Endoscopy;  Laterality: N/A;  . CHOLECYSTECTOMY    . COLONOSCOPY N/A 02/07/2016   Procedure: COLONOSCOPY;  Surgeon: Doran Stabler, MD;  [facility];  Service: Endoscopy;  Laterality: N/A;  . DILATION AND  CURETTAGE OF UTERUS    . ESOPHAGOGASTRODUODENOSCOPY N/A 12/12/2014   Procedure: ESOPHAGOGASTRODUODENOSCOPY (EGD);  Surgeon: Inda Castle, MD;  Location: [facility];  Service: Endoscopy;  Laterality: N/A;  with dilation  . ESOPHAGOGASTRODUODENOSCOPY (EGD) WITH PROPOFOL N/A 10/09/2014   Procedure: ESOPHAGOGASTRODUODENOSCOPY (EGD) WITH PROPOFOL;  Surgeon: Inda Castle, MD;  Location: [facility];  Service: Endoscopy;  Laterality: N/A;  . ESOPHAGOGASTRODUODENOSCOPY (EGD) WITH PROPOFOL N/A 09/19/2016   Procedure: ESOPHAGOGASTRODUODENOSCOPY (EGD) WITH PROPOFOL;  Surgeon: Doran Stabler, MD;  [facility] ENDOSCOPY;  Service: Gastroenterology;  Laterality: N/A;  with savary dil tt  . FRACTURE SURGERY    . ORIF ANKLE FRACTURE Right 07/04/2015   Procedure: OPEN REDUCTION INTERNAL FIXATION (ORIF) ANKLE FRACTURE;  Surgeon: Newt Minion, MD;  Location: [facility];  Service: Orthopedics;  Laterality: Right;  OPEN REDUCTION, INTERNAL FIXATION OF RIGHT ANKLE FRACTURE.   [name] ORIF ANKLE FRACTURE Left 02/10/2016   Procedure: OPEN REDUCTION INTERNAL FIXATION (ORIF) ANKLE FRACTURE;  Surgeon: Newt Minion, MD;  Location: [facility];  Service: Orthopedics;  Laterality: Left;  . SAVORY DILATION N/A 10/09/2014   Procedure: SAVORY DILATION;  Surgeon: Inda Castle, MD;  Location: [facility];  Service: Endoscopy;  Laterality: N/A;  . SAVORY DILATION N/A 09/19/2016   Procedure: SAVORY DILATION;  Surgeon: Doran Stabler, MD;  [facility] ENDOSCOPY;  Service: Gastroenterology;  Laterality: N/A;  . TONSILLECTOMY      Vitals:   03/28/17 1424 03/28/17 1444  BP: 132/82 (!) 144/88  Pulse: (!) 102 (!) 111  SpO2: 93% 95%  At rest and after DGI and ambulation. Pt reports MD told pt that as long as her resting HR is <120bpm, she should not be concerned.       Subjective Assessment - 03/28/17 1456    Subjective Pt reported she was hospitalized last week 2/2 UTI and experienced incr. impaired balance, confusion and  fever. Pt reported she feels much better and is ready to participate in PT.    Pertinent History HTN, active R lung CA (pt reports it is now "dormant" not in remission), emphysema, neuropathy in B hands/feet, RA, hx of B ankle fracture s/p ORIFs, hx of metatarsal fx's    Patient Stated Goals Improve awareness when walking, improve balance, be able to monitor her feet better   Currently in Pain? No/denies            [facility] PT Assessment - 03/28/17 1504      Sensation   Light Touch Impaired by gross assessment   Additional Comments Pt reported constant N/T in B hands and feet. Pt reported decr. light touch in L hand but normal sensation in B LEs.     ROM / Strength   AROM / PROM / Strength AROM;Strength     AROM   Overall AROM  Deficits   Overall AROM Comments BUE/LE AROM WFL except for decrease R ankle DF 2/2 hx of fractures and surgery.     Strength   Overall Strength Deficits   Overall Strength Comments B hip flex: 3+/5, knee ext: 4/5, knee flex: 4/5, R ankle DF: 2/5 (2/2 decr. ROM), L ankle DF: 3+/5. B hip abd/add tested in seated position: 4-/5.                     [facility] Adult PT Treatment/Exercise - 03/28/17 1423      Ambulation/Gait   Ambulation/Gait Yes   Ambulation/Gait Assistance 5: Supervision   Ambulation/Gait Assistance Details Cues to improve stride length, heel strike, reciprocal arm swing. Pt reported moderate SOB at end of amb. SaO2 on room air: 95-96% and HR: 111bpm after amb.    Ambulation Distance (Feet) 415 Feet  60'x2, 100'x2   Assistive device None;Straight cane   Gait Pattern Step-through pattern;Decreased stride length;Decreased dorsiflexion - left;Decreased dorsiflexion - right;Decreased arm swing - right;Decreased arm swing - left   Ambulation Surface Level;Indoor   Gait velocity First trial: 2.65f/sec. no AD, Second trial: 2.032fsec.     Standardized Balance Assessment   Standardized Balance Assessment Dynamic Gait Index     Dynamic  Gait Index   Level Surface Moderate Impairment   Change in Gait Speed Moderate Impairment   Gait with Horizontal Head Turns Mild Impairment   Gait with Vertical Head Turns Mild Impairment   Gait and Pivot Turn Mild Impairment   Step Over Obstacle Moderate Impairment   Step Around Obstacles Mild Impairment   Steps Mild Impairment   Total Score 13                PT Education - 03/28/17 1457    Education provided Yes   Education Details PT discussed re-eval findings and outcome measure results and goal progress. PT requested pt schedule 2x/week for 4 weeks based on progress and POC.    Person(s) Educated Patient   Methods Explanation   Comprehension  Verbalized understanding          PT Short Term Goals - 03/28/17 1506      PT SHORT TERM GOAL #1   Title Pt will be IND in HEP to improve balance, strength, and flexibility. TARGET DATE FOR ALL STGS: 03/29/17   Status New     PT SHORT TERM GOAL #2   Title Pt will improve DGI score to >/=18/24 to decr. falls risk.    Status Not Met     PT SHORT TERM GOAL #3   Title Pt will improve gait speed to >/=2.64f/sec. to safely amb. in the community, without AD.    Status Not Met     PT SHORT TERM GOAL #4   Title Pt will amb. 400' over even terrain, IND, to improve functional mobility.    Status Not Met           PT Long Term Goals - 03/01/17 1549      PT LONG TERM GOAL #1   Title Pt will verbalize understanding of falls risk prevention strategies to reduce risk of another falls. TARGET DATE FOR ALL LTGS: 04/26/17   Status New     PT LONG TERM GOAL #2   Title Pt will amb. 800' over even/uneven terrain (outdoors) with LRAD, at MOD I level to improve functional mobility.    Status New     PT LONG TERM GOAL #3   Title Pt will improve DGI score to >/=20/24 to decr. falls risk.    Status New     PT LONG TERM GOAL #4   Title Pt will perform floor to standing txfs at MOD I level to improve safety during functional mobility.     Status New               Plan - 03/28/17 1459    Clinical Impression Statement Today's skilled session focused on re-eval and assessing goal progress, as pt was recently hospitalized for an UTI. PT assessed pt's ability to participate in PT, prior to assessing goals. Pt denied sensation changes and B UE/LE AROM [facility] B ankle DF 2/2 hx of fractures and surgery. Pt's strength was not significantly different than evaluation. Pt did not meet STGs 2, 3, and 4. PT will assess STG 1 next session. However, pt not meeting STGs likely due to hospitalization and pt not performing HEP or coming to PT 2/2 UTI. Pt's DGI score indicates pt is at risk for falls and her walking speed indicates pt is unable to safely amb. in the community. Pt would continue to benefit from skilled PT to improve safety during functional mobility.    Rehab Potential Good   PT Frequency 2x / week   PT Duration 8 weeks   PT Treatment/Interventions ADLs/Self Care Home Management;Biofeedback;Electrical Stimulation;Cryotherapy;Neuromuscular re-education;Balance training;Therapeutic exercise;Manual techniques;Therapeutic activities;Functional mobility training;Stair training;Gait training;Orthotic Fit/Training;DME Instruction;Patient/family education;Vestibular   PT Next Visit Plan Assess STG 1 and progress HEP as tolerated. Make sure pt schedule visits. continue with high level balance-SLS, compliant surfaces   PT Home Exercise Plan balance and heel cord and hamstring stretching   Consulted and Agree with Plan of Care Patient      Patient will benefit from skilled therapeutic intervention in order to improve the following deficits and impairments:  Abnormal gait, Decreased endurance, Decreased knowledge of use of DME, Increased edema, Postural dysfunction, Impaired flexibility, Decreased range of motion, Decreased balance, Decreased mobility, Decreased strength  Visit Diagnosis: Other abnormalities of gait  and mobility  Muscle weakness (generalized)   G-code: mobility DGI: 13/24 and gait speed no AD: 2.53f/sec. Current: CK Goal: CI  Problem List Patient Active Problem List   Diagnosis Date Noted  . Altered mental status 03/22/2017  . Acute pyelonephritis 03/22/2017  . Hyponatremia 03/22/2017  . Hypokalemia 03/22/2017  . Primary osteoarthritis of both hands 03/08/2017  . High risk medication use 03/07/2017  . Facial laceration 02/07/2017  . Poor balance 02/07/2017  . Type 2 diabetes mellitus with diabetic polyneuropathy, with long-term current use of insulin ([facility] 01/11/2017  . Subarachnoid bleed ([place] 01/11/2017  . Painful orthopaedic hardware (HD[place] 01/05/2017  . Psoriatic arthropathy (HM[place] 09/08/2016  . Essential hypertension 09/08/2016  . Diabetic neuropathy ([place] 09/08/2016  . LADA (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), managed as type 1 (HPr[place] 05/04/2016  . Syncope 03/25/2016  . Malnutrition of moderate degree 02/29/2016  . Leukocytosis 02/17/2016  . Diabetic ketoacidosis with coma associated with diabetes mellitus due to underlying condition (HHawai[facility] [place]   . Abnormal finding on GI tract imaging   . Hematochezia   . RLS (restless legs syndrome) 12/11/2015  . Liver dysfunction 08/11/2015  . Lumbago 05/06/2015  . Hot flashes 04/14/2015  . Peripheral edema 04/14/2015  . Psoriasis 04/14/2015  . Encounter for antineoplastic immunotherapy 04/06/2015  . Emphysema of lung ([place] 11/26/2014  . Radiation-induced esophageal stricture 10/01/2014  . Tachycardia 08/27/2014  . Depression 08/27/2014  . Palliative care encounter 08/21/2014  . DNR (do not resuscitate) discussion 08/21/2014  . Neuropathic pain 08/21/2014  . Cancer associated pain 07/29/2014  . Malignant cachexia ([place] 07/29/2014  . Lung collapse 07/29/2014  . Fatigue 07/18/2014  . Neuropathy due to chemotherapeutic drug (HPaden 07/18/2014  . Leg cramps 07/18/2014  . Hypoalbuminemia 07/18/2014  . Thrombocytopenia,  unspecified (HDent 07/18/2014  . Dyspnea 04/28/2014  . Radiation esophagitis 04/10/2014  . Right-sided chest wall pain 04/01/2014  . Cancer of middle lobe of lung (HSportsmen Ac[place] 01/17/2014  . Malignant neoplasm of right upper lobe of lung ([place] 11/28/2013  . Lung mass 11/09/2013    Miller,Jennifer L 03/28/2017, 3:09 PM  [facility] [place] [address]Phone: [phone]  Fax:  [phone] Name: JJODELLE FAUSTOMRN: [id number] of Birth: [birth year] JGeoffry [place] PT,DPT 03/28/17 3:10 PM Phone: [phone]: [phone]

## 2017-03-29 ENCOUNTER — Ambulatory Visit (INDEPENDENT_AMBULATORY_CARE_PROVIDER_SITE_OTHER): Payer: Medicare Other | Admitting: Internal Medicine

## 2017-03-29 ENCOUNTER — Encounter: Payer: Self-pay | Admitting: Internal Medicine

## 2017-03-29 VITALS — BP 128/80 | HR 118 | Temp 97.9°F | Resp 16 | Wt 172.0 lb

## 2017-03-29 DIAGNOSIS — N10 Acute tubulo-interstitial nephritis: Secondary | ICD-10-CM

## 2017-03-29 DIAGNOSIS — I10 Essential (primary) hypertension: Secondary | ICD-10-CM | POA: Diagnosis not present

## 2017-03-29 DIAGNOSIS — Z794 Long term (current) use of insulin: Secondary | ICD-10-CM | POA: Diagnosis not present

## 2017-03-29 DIAGNOSIS — D696 Thrombocytopenia, unspecified: Secondary | ICD-10-CM | POA: Diagnosis not present

## 2017-03-29 DIAGNOSIS — E1142 Type 2 diabetes mellitus with diabetic polyneuropathy: Secondary | ICD-10-CM

## 2017-03-29 DIAGNOSIS — R197 Diarrhea, unspecified: Secondary | ICD-10-CM

## 2017-03-29 DIAGNOSIS — E876 Hypokalemia: Secondary | ICD-10-CM

## 2017-03-29 DIAGNOSIS — E871 Hypo-osmolality and hyponatremia: Secondary | ICD-10-CM

## 2017-03-29 LAB — CULTURE, BLOOD (ROUTINE X 2)
Culture: NO GROWTH
Culture: NO GROWTH
SPECIAL REQUESTS: ADEQUATE
Special Requests: ADEQUATE

## 2017-03-29 MED ORDER — CEPHALEXIN 500 MG PO CAPS: 500.0000 mg | ORAL_CAPSULE | Freq: Three times a day (TID) | ORAL | 0 refills | Status: DC

## 2017-03-29 NOTE — Patient Instructions (Addendum)
Start taking florastar probiotics.  Stop the uceris.     Test(s) ordered today. Your results will be released to [place] (or called to you) after review, usually within 72hours after test completion. If any changes need to be made, you will be notified at that same time.    Medications reviewed and updated.  Changes include stopping the cipro and starting keflex for your UTI.  Your prescription(s) have been submitted to your pharmacy. Please take as directed and contact our office if you believe you are having problem(s) with the medication(s).  Please followup in 6 months

## 2017-03-29 NOTE — Progress Notes (Signed)
Subjective:    Patient ID: Megan [name], female    DOB: September 06, 1941, 76 y.o.   MRN: [id number]  HPI The patient is here for follow up.  Admitted 03/22/17-03/25/17.  She went to the emergency room for weakness, fever and confusion.  3 days prior to going to the emergency room she started to feel weak and had to hold onto things to walk. She was unsteady. This progressively worsened over the next couple of days and she started shaking, sweating and was having dry heaves. She was complaining of chronic urinary frequency, which had been worse recently. She was not a good historian. Her temperature in the emergency room was 102.6. Heart rate 108, respirations 28, blood pressure 149/63. Lactate was normal. Urinalysis showed nitrates, WBCs there were too numerous to count. Chest x-ray did not show any acute findings. She was given 2 L of fluids and started on ceftriaxone.  She was admitted for altered mental status, pyelonephritis, hyponatremia, hypokalemia.  Pyelonephritis: She did meet SIRS criteria. She was started on ceftriaxone. Blood cultures have remained negative.  Urine culture was positive for Escherichia coli.  A renal US was normal.  She was discharged home with Cipro twice daily for 1 week.  She stopped the cipro due to a rash.    Hyponatremia, hypokalemia:  Both were mild.  They improved with repletion.    Thrombocytopenia: This was slightly worse and she arrived in the emergency room, but did improve during her hospitalization.  Diabetes:   Her sugars this morning was 453.  Yesterday morning it was 75.  She is following with Dr Cruzita Lederer.  She is trying to get herself on a better, more regular schedule.  She knows she needs to do.    Htn:  Her BP was stable while in the hospital.  Her HR has been elevated - this is not new.  She has seen cardiology and was told it is ok as long as it stays less than 120.   Rash:  When she got out of the hospital she got a rash secondary to Cipro.   The rash was diffuse and itchy.  She stopped taking the medication and took benadryl per the pharmacy.  The rash is gone.  She also had some diarrhea from the Cipro.  She started her uceris, which she took for colitis in the past.  She is not taking a probtiotic.  She denies abdominal pain or blood in her stool.    Medications and allergies reviewed with patient and updated if appropriate.  Patient Active Problem List   Diagnosis Date Noted  . Altered mental status 03/22/2017  . Acute pyelonephritis 03/22/2017  . Hyponatremia 03/22/2017  . Hypokalemia 03/22/2017  . Primary osteoarthritis of both hands 03/08/2017  . High risk medication use 03/07/2017  . Facial laceration 02/07/2017  . Poor balance 02/07/2017  . Type 2 diabetes mellitus with diabetic polyneuropathy, with long-term current use of insulin ([place]) 01/11/2017  . Subarachnoid bleed ([place]) 01/11/2017  . Painful orthopaedic hardware ([place]) 01/05/2017  . Psoriatic arthropathy ([place]) 09/08/2016  . Essential hypertension 09/08/2016  . Diabetic neuropathy ([place]) 09/08/2016  . LADA (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), managed as type 1 ([place]) 05/04/2016  . Syncope 03/25/2016  . Malnutrition of moderate degree 02/29/2016  . Leukocytosis 02/17/2016  . Diabetic ketoacidosis with coma associated with diabetes mellitus due to underlying condition ([place])   . Abnormal finding on GI tract imaging   . Hematochezia   . RLS (restless  legs syndrome) 12/11/2015  . Liver dysfunction 08/11/2015  . Lumbago 05/06/2015  . Hot flashes 04/14/2015  . Peripheral edema 04/14/2015  . Psoriasis 04/14/2015  . Encounter for antineoplastic immunotherapy 04/06/2015  . Emphysema of lung ([place]) 11/26/2014  . Radiation-induced esophageal stricture 10/01/2014  . Tachycardia 08/27/2014  . Depression 08/27/2014  . Palliative care encounter 08/21/2014  . DNR (do not resuscitate) discussion 08/21/2014  . Neuropathic pain 08/21/2014  . Cancer associated pain  07/29/2014  . Malignant cachexia ([place]) 07/29/2014  . Lung collapse 07/29/2014  . Fatigue 07/18/2014  . Neuropathy due to chemotherapeutic drug ([place]) 07/18/2014  . Leg cramps 07/18/2014  . Hypoalbuminemia 07/18/2014  . Thrombocytopenia, unspecified ([place]) 07/18/2014  . Dyspnea 04/28/2014  . Radiation esophagitis 04/10/2014  . Right-sided chest wall pain 04/01/2014  . Cancer of middle lobe of lung ([place]) 01/17/2014  . Malignant neoplasm of right upper lobe of lung ([place]) 11/28/2013    Current Outpatient Prescriptions on File Prior to Visit  Medication Sig Dispense Refill  . amLODipine (NORVASC) 2.5 MG tablet Take 1 tablet (2.5 mg total) by mouth daily. 90 tablet 3  . augmented betamethasone dipropionate (DIPROLENE-AF) 0.05 % ointment APP AA BID PRN  1  . DULoxetine (CYMBALTA) 60 MG capsule Take 1 capsule (60 mg total) by mouth 2 (two) times daily. 60 capsule 12  . insulin lispro (HUMALOG KWIKPEN) 100 UNIT/ML KiwkPen INJECT 8-20 units 3x a day under skin (Patient taking differently: Inject 8-20 Units into the skin 4 (four) times daily. Per patient sliding scale.) 30 mL 5  . oxyCODONE-acetaminophen (PERCOCET/ROXICET) 5-325 MG tablet TAKE 1 TABLET BY MOUTH EVERY4-6 HOURS AS NEEDED FOR PAIN.  0  . pregabalin (LYRICA) 150 MG capsule Take 1 capsule (150 mg total) by mouth 2 (two) times daily. 60 capsule 5  . sulfaSALAzine (AZULFIDINE) 500 MG EC tablet Take 2 tablets (1,000 mg total) by mouth 2 (two) times daily. 120 tablet 2   No current facility-administered medications on file prior to visit.     Past Medical History:  Diagnosis Date  . Cancer ([place]) dx'd 09/2013   Lung ca  . Diabetes ([place]) 09/08/2016   type I diabetic patient reported on 09/13/16   . Diabetes mellitus    insulin  . Diabetic neuropathy ([place]) 09/08/2016  . GERD (gastroesophageal reflux disease)    no meds for  . History of hiatal hernia   . Hx of radiation therapy 12/16/13-01/30/14   lung 66Gy  . Hypertension   . Malignant  neoplasm of right upper lobe of lung ([place]) 11/28/2013  . Neuropathy   . Psoriasis   . Psoriatic arthritis ([place]) 09/08/2016  . Rheumatoid arthritis ([place])   . Shortness of breath dyspnea    with exertion    Past Surgical History:  Procedure Laterality Date  . ABDOMINAL HYSTERECTOMY    . ABDOMINAL SURGERY    . BALLOON DILATION N/A 12/12/2014   Procedure: BALLOON DILATION;  Surgeon: Inda Castle, MD;  Location[facility];  Service: Endoscopy;  Laterality: N/A;  . CHOLECYSTECTOMY    . COLONOSCOPY N/A 02/07/2016   Procedure: COLONOSCOPY;  Surgeon: Doran Stabler, MD;  [facility];  Service: Endoscopy;  Laterality: N/A;  . DILATION AND CURETTAGE OF UTERUS    . ESOPHAGOGASTRODUODENOSCOPY N/A 12/12/2014   Procedure: ESOPHAGOGASTRODUODENOSCOPY (EGD);  Surgeon: Inda Castle, MD;  [facility];  Service: Endoscopy;  Laterality: N/A;  with dilation  . ESOPHAGOGASTRODUODENOSCOPY (EGD) WITH PROPOFOL N/A 10/09/2014   Procedure: ESOPHAGOGASTRODUODENOSCOPY (EGD)  WITH PROPOFOL;  Surgeon: Inda Castle, MD;  Location: [facility] ENDOSCOPY;  Service: Endoscopy;  Laterality: N/A;  . ESOPHAGOGASTRODUODENOSCOPY (EGD) WITH PROPOFOL N/A 09/19/2016   Procedure: ESOPHAGOGASTRODUODENOSCOPY (EGD) WITH PROPOFOL;  Surgeon: Doran Stabler, MD;  [facility];  Service: Gastroenterology;  Laterality: N/A;  with savary dil tt  . FRACTURE SURGERY    . ORIF ANKLE FRACTURE Right 07/04/2015   Procedure: OPEN REDUCTION INTERNAL FIXATION (ORIF) ANKLE FRACTURE;  Surgeon: Newt Minion, MD;  Location: [facility];  Service: Orthopedics;  Laterality: Right;  OPEN REDUCTION, INTERNAL FIXATION OF RIGHT ANKLE FRACTURE.   [name] ORIF ANKLE FRACTURE Left 02/10/2016   Procedure: OPEN REDUCTION INTERNAL FIXATION (ORIF) ANKLE FRACTURE;  Surgeon: Newt Minion, MD;  Location: [place];  Service: Orthopedics;  Laterality: Left;  . SAVORY DILATION N/A 10/09/2014   Procedure: SAVORY DILATION;  Surgeon: Inda Castle, MD;   Location: [place];  Service: Endoscopy;  Laterality: N/A;  . SAVORY DILATION N/A 09/19/2016   Procedure: SAVORY DILATION;  Surgeon: Doran Stabler, MD;  [facility];  Service: Gastroenterology;  Laterality: N/A;  . TONSILLECTOMY      Social History   Social History  . Marital status: Divorced    Spouse name: N/A  . Number of children: 0  . Years of education: MA   Occupational History  . Retired Other   Social History Main Topics  . Smoking status: Former Smoker    Packs/day: 3.00    Years: 45.00    Types: Cigarettes    Quit date: 12/05/1997  . Smokeless tobacco: Never Used  . Alcohol use No  . Drug use: No  . Sexual activity: Not Currently   Other Topics Concern  . Not on file   Social History Narrative   Patient lives at home alone.   Caffeine Use: 2 cups daily    Family History  Problem Relation Age of Onset  . Other Mother   . Other Father        failure to thrive  . Hypertension Other   . Stroke Other   . Heart attack Other   . Hemachromatosis Other   . Rheum arthritis Unknown        both sets of grandparents and father    Review of Systems  Constitutional: Positive for fatigue. Negative for appetite change (pretty good), chills and fever.  Respiratory: Positive for cough, shortness of breath and wheezing.   Cardiovascular: Positive for leg swelling (chronic). Negative for chest pain and palpitations.  Gastrointestinal: Positive for diarrhea. Negative for abdominal pain, blood in stool and nausea.  Genitourinary: Positive for frequency (chronic, not new). Negative for dysuria and hematuria.  Skin: Positive for rash (resolved).  Neurological: Positive for dizziness. Negative for headaches.       Objective:   Vitals:   03/29/17 1009  BP: 128/80  Pulse: (!) 118  Resp: 16  Temp: 97.9 F (36.6 C)   Wt Readings from Last 3 Encounters:  03/29/17 172 lb (78 kg)  03/08/17 182 lb (82.6 kg)  02/15/17 183 lb 9.6 oz (83.3 kg)   Body  mass index is 28.19 kg/m.   Physical Exam    Constitutional: Appears well-developed and well-nourished. No distress.  HENT:  Head: Normocephalic and atraumatic.  Neck: Neck supple. No tracheal deviation present. No thyromegaly present.  No cervical lymphadenopathy Cardiovascular: tachycardia, regular rhythm and normal heart sounds.   No murmur heard. No carotid bruit .  1 + b/l  LE edema Pulmonary/Chest: Effort normal and breath sounds normal. No respiratory distress. No has no wheezes. No rales.  Abdomen: soft, non tender, non distended Skin: Skin is warm and dry. Not diaphoretic.  Psychiatric: Normal mood and affect. Behavior is normal.      Assessment & Plan:    See Problem List for Assessment and Plan of chronic medical problems.

## 2017-03-29 NOTE — Assessment & Plan Note (Signed)
Improved prior to leaving hospital Will not repeat today Eating / drinking normally

## 2017-03-29 NOTE — Assessment & Plan Note (Signed)
Since being in hospital and being on antibiotics Start probiotic Stop uceris for now If no improvement she will let me know

## 2017-03-29 NOTE — Assessment & Plan Note (Signed)
Has follow up with Dr Cruzita Lederer scheduled Sugars are variable at home - she is trying to have a more regular schedule - she states she knows what she needs to do She will call Dr Cruzita Lederer prior to her appt if needed for uncontrolled sugars

## 2017-03-29 NOTE — Assessment & Plan Note (Signed)
Had allergic rash to Cipro - stopped medication Will start keflex TID x 1 week Discussed she may be at increased risk for future UTI's -  monitor for subtle symptoms/signs

## 2017-03-29 NOTE — Assessment & Plan Note (Signed)
BP well controlled Current regimen effective and well tolerated Continue current medications at current doses  Elevated HR not new - has seen cardiology and will just monitor

## 2017-03-29 NOTE — Assessment & Plan Note (Signed)
Improved - likely reactive Has follow up with oncology in July

## 2017-03-31 ENCOUNTER — Other Ambulatory Visit: Payer: Self-pay | Admitting: Internal Medicine

## 2017-03-31 ENCOUNTER — Ambulatory Visit: Payer: Medicare Other | Attending: Internal Medicine

## 2017-03-31 DIAGNOSIS — R208 Other disturbances of skin sensation: Secondary | ICD-10-CM | POA: Insufficient documentation

## 2017-03-31 DIAGNOSIS — M6281 Muscle weakness (generalized): Secondary | ICD-10-CM | POA: Diagnosis present

## 2017-03-31 DIAGNOSIS — R2689 Other abnormalities of gait and mobility: Secondary | ICD-10-CM | POA: Diagnosis not present

## 2017-03-31 DIAGNOSIS — M25671 Stiffness of right ankle, not elsewhere classified: Secondary | ICD-10-CM | POA: Diagnosis present

## 2017-03-31 NOTE — Therapy (Signed)
[facility] [address] [place], [place], [address] Phone: [phone]   Fax:  [phone]  Physical Therapy Treatment  Patient Details  Name: Megan [name] MRN: [id number] Date of Birth: 04-Oct-1941 Referring Provider: Dr. Quay Burow  Encounter Date: 03/31/2017      PT End of Session - 03/31/17 1450    Visit Number 7   Number of Visits 17   Date for PT Re-Evaluation 04/30/17   Authorization Type Medicare: G-CODE AND PROGRESS NOTE EVERY 10TH VISIT.   PT Start Time 1401   PT Stop Time 1443   PT Time Calculation (min) 42 min   Equipment Utilized During Treatment --  S prn   Activity Tolerance Patient tolerated treatment well   Behavior During Therapy WFL for tasks assessed/performed      Past Medical History:  Diagnosis Date  . Cancer ([place]) dx'd 09/2013   Lung ca  . Diabetes ([place]) 09/08/2016   type I diabetic patient reported on 09/13/16   . Diabetes mellitus    insulin  . Diabetic neuropathy ([place]) 09/08/2016  . GERD (gastroesophageal reflux disease)    no meds for  . History of hiatal hernia   . Hx of radiation therapy 12/16/13-01/30/14   lung 66Gy  . Hypertension   . Malignant neoplasm of right upper lobe of lung ([place]) 11/28/2013  . Neuropathy   . Psoriasis   . Psoriatic arthritis ([place]) 09/08/2016  . Rheumatoid arthritis ([place])   . Shortness of breath dyspnea    with exertion    Past Surgical History:  Procedure Laterality Date  . ABDOMINAL HYSTERECTOMY    . ABDOMINAL SURGERY    . BALLOON DILATION N/A 12/12/2014   Procedure: BALLOON DILATION;  Surgeon: Inda Castle, MD;  Location: [facility];  Service: Endoscopy;  Laterality: N/A;  . CHOLECYSTECTOMY    . COLONOSCOPY N/A 02/07/2016   Procedure: COLONOSCOPY;  Surgeon: Doran Stabler, MD;  Location: [facility] ENDOSCOPY;  Service: Endoscopy;  Laterality: N/A;  . DILATION AND CURETTAGE OF UTERUS    . ESOPHAGOGASTRODUODENOSCOPY N/A 12/12/2014   Procedure:  ESOPHAGOGASTRODUODENOSCOPY (EGD);  Surgeon: Inda Castle, MD;  Location: [facility];  Service: Endoscopy;  Laterality: N/A;  with dilation  . ESOPHAGOGASTRODUODENOSCOPY (EGD) WITH PROPOFOL N/A 10/09/2014   Procedure: ESOPHAGOGASTRODUODENOSCOPY (EGD) WITH PROPOFOL;  Surgeon: Inda Castle, MD;  Location: [facility];  Service: Endoscopy;  Laterality: N/A;  . ESOPHAGOGASTRODUODENOSCOPY (EGD) WITH PROPOFOL N/A 09/19/2016   Procedure: ESOPHAGOGASTRODUODENOSCOPY (EGD) WITH PROPOFOL;  Surgeon: Doran Stabler, MD;  Location: [facility] ENDOSCOPY;  Service: Gastroenterology;  Laterality: N/A;  with savary dil tt  . FRACTURE SURGERY    . ORIF ANKLE FRACTURE Right 07/04/2015   Procedure: OPEN REDUCTION INTERNAL FIXATION (ORIF) ANKLE FRACTURE;  Surgeon: Newt Minion, MD;  Location: [facility];  Service: Orthopedics;  Laterality: Right;  OPEN REDUCTION, INTERNAL FIXATION OF RIGHT ANKLE FRACTURE.   [name] ORIF ANKLE FRACTURE Left 02/10/2016   Procedure: OPEN REDUCTION INTERNAL FIXATION (ORIF) ANKLE FRACTURE;  Surgeon: Newt Minion, MD;  Location: [facility];  Service: Orthopedics;  Laterality: Left;  . SAVORY DILATION N/A 10/09/2014   Procedure: SAVORY DILATION;  Surgeon: Inda Castle, MD;  Location: [facility];  Service: Endoscopy;  Laterality: N/A;  . SAVORY DILATION N/A 09/19/2016   Procedure: SAVORY DILATION;  Surgeon: Doran Stabler, MD;  Location: [facility] ENDOS[facility];  Service: Gastroenterology;  Laterality: N/A;  . TONSILLECTOMY      There were no vitals  filed for this visit.      Subjective Assessment - 03/31/17 1405    Subjective Pt reported her blood sugar was in the 60's before lunch and she ate some grapefruit and now feels better. Pt denied falls.    Pertinent History HTN, active R lung CA (pt reports it is now "dormant" not in remission), emphysema, neuropathy in B hands/feet, RA, hx of B ankle fracture s/p ORIFs, hx of metatarsal fx's    Patient Stated Goals Improve awareness when walking, improve  balance, be able to monitor her feet better   Currently in Pain? No/denies          Therex and Neuro re-ed: Pt performed therex in standing, seated and sidelying positions. Pt required cue for technique.  Pt performed balance activities in standing with intermittent UE support and cues for technique. Pt denied pain during session and reported she felt much better since eating grapefruit earlier this afternoon. Please see pt instructions for HEP details, changes updated in bold.                       PT Education - 03/31/17 1450    Education provided Yes   Education Details PT progressed/modified HEP as tolerated and provided pt with updated copy.   Person(s) Educated Patient   Methods Explanation;Demonstration;Verbal cues;Handout   Comprehension Verbalized understanding;Returned demonstration          PT Short Term Goals - 03/31/17 1452      PT SHORT TERM GOAL #1   Title Pt will be IND in HEP to improve balance, strength, and flexibility. TARGET DATE FOR ALL STGS: 03/29/17   Status Partially Met     PT SHORT TERM GOAL #2   Title Pt will improve DGI score to >/=18/24 to decr. falls risk.    Status Not Met     PT SHORT TERM GOAL #3   Title Pt will improve gait speed to >/=2.28f/sec. to safely amb. in the community, without AD.    Status Not Met     PT SHORT TERM GOAL #4   Title Pt will amb. 400' over even terrain, IND, to improve functional mobility.    Status Not Met           PT Long Term Goals - 03/01/17 1549      PT LONG TERM GOAL #1   Title Pt will verbalize understanding of falls risk prevention strategies to reduce risk of another falls. TARGET DATE FOR ALL LTGS: 04/26/17   Status New     PT LONG TERM GOAL #2   Title Pt will amb. 800' over even/uneven terrain (outdoors) with LRAD, at MOD I level to improve functional mobility.    Status New     PT LONG TERM GOAL #3   Title Pt will improve DGI score to >/=20/24 to decr. falls risk.     Status New     PT LONG TERM GOAL #4   Title Pt will perform floor to standing txfs at MOD I level to improve safety during functional mobility.    Status New               Plan - 03/31/17 1451    Clinical Impression Statement Pt partially met STG 1, as she required cues for technique during several activities. Overall, pt demonstrated improved balance and strength as she tolerated progression of balance activities and incr. reps/sets during strengthening exercises. Pt's endurance improved today, as she only required  one seaeted rest break during session today. Continue with POC.    Rehab Potential Good   PT Frequency 2x / week   PT Duration 8 weeks   PT Treatment/Interventions ADLs/Self Care Home Management;Biofeedback;Electrical Stimulation;Cryotherapy;Neuromuscular re-education;Balance training;Therapeutic exercise;Manual techniques;Therapeutic activities;Functional mobility training;Stair training;Gait training;Orthotic Fit/Training;DME Instruction;Patient/family education;Vestibular   PT Next Visit Plan continue with high level balance-SLS, compliant surfaces, hip strengthening.   PT Home Exercise Plan balance and heel cord and hamstring stretching   Consulted and Agree with Plan of Care Patient      Patient will benefit from skilled therapeutic intervention in order to improve the following deficits and impairments:  Abnormal gait, Decreased endurance, Decreased knowledge of use of DME, Increased edema, Postural dysfunction, Impaired flexibility, Decreased range of motion, Decreased balance, Decreased mobility, Decreased strength  Visit Diagnosis: Other abnormalities of gait and mobility  Muscle weakness (generalized)     Problem List Patient Active Problem List   Diagnosis Date Noted  . Altered mental status 03/22/2017  . Acute pyelonephritis 03/22/2017  . Hyponatremia 03/22/2017  . Hypokalemia 03/22/2017  . Primary osteoarthritis of both hands 03/08/2017  . High  risk medication use 03/07/2017  . Facial laceration 02/07/2017  . Poor balance 02/07/2017  . Type 2 diabetes mellitus with diabetic polyneuropathy, with long-term current use of insulin ([place]) 01/11/2017  . Subarachnoid bleed ([place]) 01/11/2017  . Painful orthopaedic hardware ([place]) 01/05/2017  . Psoriatic arthropathy ([place]) 09/08/2016  . Essential hypertension 09/08/2016  . Diabetic neuropathy ([place]) 09/08/2016  . LADA (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), managed as type 1 ([place]) 05/04/2016  . Syncope 03/25/2016  . Malnutrition of moderate degree 02/29/2016  . Leukocytosis 02/17/2016  . Diabetic ketoacidosis with coma associated with diabetes mellitus due to underlying condition ([place])   . Abnormal finding on GI tract imaging   . Diarrhea   . Hematochezia   . RLS (restless legs syndrome) 12/11/2015  . Liver dysfunction 08/11/2015  . Lumbago 05/06/2015  . Peripheral edema 04/14/2015  . Psoriasis 04/14/2015  . Encounter for antineoplastic immunotherapy 04/06/2015  . Emphysema of lung ([place]) 11/26/2014  . Radiation-induced esophageal stricture 10/01/2014  . Tachycardia 08/27/2014  . Depression 08/27/2014  . Palliative care encounter 08/21/2014  . DNR (do not resuscitate) discussion 08/21/2014  . Neuropathic pain 08/21/2014  . Cancer associated pain 07/29/2014  . Malignant cachexia ([place]) 07/29/2014  . Lung collapse 07/29/2014  . Fatigue 07/18/2014  . Neuropathy due to chemotherapeutic drug ([place]) 07/18/2014  . Leg cramps 07/18/2014  . Hypoalbuminemia 07/18/2014  . Thrombocytopenia, unspecified ([place]) 07/18/2014  . Dyspnea 04/28/2014  . Radiation esophagitis 04/10/2014  . Right-sided chest wall pain 04/01/2014  . Cancer of middle lobe of lung ([place]) 01/17/2014  . Malignant neoplasm of right upper lobe of lung ([place]) 11/28/2013    Lasheika Ortloff L 03/31/2017, 2:53 PM  [facility] [address] [place], [place],  [address] Phone: [phone]   Fax:  [phone]  Name: Megan [name] MRN: [id number] Date of Birth: 10/22/1941  Geoffry Paradise, PT,DPT 03/31/17 2:55 PM Phone: [phone] Fax: [phone]

## 2017-03-31 NOTE — Patient Instructions (Signed)
SIT TO STAND: Feet Narrow    Place feet shoulder width apart.. Lean chest forward. Raise hips and straighten knees to stand. _10__ reps per set, _1_ sets per day, _1__ times per day. Hold onto a support as needed.  Copyright  VHI. All rights reserved.   Standing Marching   Using a chair if necessary, March along the kitchen counter (about 10 feet). Repeat 4 times. Do 1 sessions per day.  http://gt2.exer.us/344   Copyright  VHI. All rights reserved.   Hip Backward Kick   Using a chair for balance, keep legs shoulder width apart and toes pointed for- ward. Slowly extend one leg back, keeping knee straight. Do not lean forward. Repeat with other leg. Repeat 10 times. Perform 3 sets. Do  3 sessions per week.  http://gt2.exer.us/340   Copyright  VHI. All rights reserved.     Hip Side Kick   Holding a chair for balance, keep legs shoulder width apart and toes pointed forward.Take a half a take back with kicking leg and then kick leg out to side, keeping knee straight. Do not lean. Repeat using other leg. Repeat 10 times. Perform 1 set. Do 1 3essions per week.  http://gt2.exer.us/342   Copyright  VHI. All rights reserved.        Standing On One Leg Without Support .  Perform at [employer]. Stand on one leg in neutral spine, hold support as needed. Progress by attempting without support (hover hand on counter). Hold 10 seconds. Repeat on other leg. Do 2 repetitions, 1 sets. Perform every day.  http://bt.exer.us/36   Copyright  VHI. All rights reserved.       Braiding   Hold counter with one hand: Move to side: 1) cross right leg in front of left, 2) bring back leg out to side, then 3) cross right leg behind left, 4) bring left leg out to side. Continue sequence in same direction. Reverse sequence, moving in opposite direction. Repeat sequence 2 times per session. Do 1 sessions per day.   Copyright  VHI. All rights reserved.    Abduction: Clam (Eccentric) - Side-Lying    Lie on side with knees bent. Tie yellow band above knees. Lift top knee, keeping feet together. Keep trunk steady, with hips forward. Slowly lower leg. _10__ reps per set, _2__ sets per day, _3__ days per week. Perform with both legs.  http://ecce.exer.us/65   Copyright  VHI. All rights reserved.    Piriformis Stretch, Sitting    Sit, one ankle on opposite knee, same-side hand on crossed knee. Push down on knee, keeping spine straight. Lean torso forward, with flat back, until tension is felt in hamstrings and gluteals of crossed-leg side. Hold _30__ seconds. Repeat on the other side and hold for 30 seconds. Repeat __3_ times per session per leg. Do _2-3__ sessions per day.  Copyright  VHI. All rights reserved.

## 2017-04-04 ENCOUNTER — Ambulatory Visit: Payer: Medicare Other | Admitting: Physical Therapy

## 2017-04-04 DIAGNOSIS — R2689 Other abnormalities of gait and mobility: Secondary | ICD-10-CM

## 2017-04-04 DIAGNOSIS — R208 Other disturbances of skin sensation: Secondary | ICD-10-CM

## 2017-04-04 DIAGNOSIS — M6281 Muscle weakness (generalized): Secondary | ICD-10-CM

## 2017-04-04 DIAGNOSIS — M25671 Stiffness of right ankle, not elsewhere classified: Secondary | ICD-10-CM

## 2017-04-04 NOTE — Therapy (Signed)
[facility] [address] [place], [place], [address] Phone: [phone]   Fax:  [phone]  Physical Therapy Treatment  Patient Details  Name: Megan [name] MRN: [id number] Date of Birth: October 12, 1941 Referring Provider: Dr. Quay Burow  Encounter Date: 04/04/2017      PT End of Session - 04/04/17 1438    Visit Number 8   Number of Visits 17   Date for PT Re-Evaluation 04/30/17   Authorization Type Medicare: G-CODE AND PROGRESS NOTE EVERY 10TH VISIT.   PT Start Time 1400   PT Stop Time 1442   PT Time Calculation (min) 42 min   Equipment Utilized During Treatment Gait belt   Activity Tolerance Patient tolerated treatment well   Behavior During Therapy WFL for tasks assessed/performed      Past Medical History:  Diagnosis Date  . Cancer ([place]) dx'd 09/2013   Lung ca  . Diabetes ([place]) 09/08/2016   type I diabetic patient reported on 09/13/16   . Diabetes mellitus    insulin  . Diabetic neuropathy ([name]) 09/08/2016  . GERD (gastroesophageal reflux disease)    no meds for  . History of hiatal hernia   . Hx of radiation therapy 12/16/13-01/30/14   lung 66Gy  . Hypertension   . Malignant neoplasm of right upper lobe of lung ([place]) 11/28/2013  . Neuropathy   . Psoriasis   . Psoriatic arthritis ([name]) 09/08/2016  . Rheumatoid arthritis ([place])   . Shortness of breath dyspnea    with exertion    Past Surgical History:  Procedure Laterality Date  . ABDOMINAL HYSTERECTOMY    . ABDOMINAL SURGERY    . BALLOON DILATION N/A 12/12/2014   Procedure: BALLOON DILATION;  Surgeon: Inda Castle, MD;  Location: [facility];  Service: Endoscopy;  Laterality: N/A;  . CHOLECYSTECTOMY    . COLONOSCOPY N/A 02/07/2016   Procedure: COLONOSCOPY;  Surgeon: Doran Stabler, MD;  Location: [facility] ENDOSCOPY;  Service: Endoscopy;  Laterality: N/A;  . DILATION AND CURETTAGE OF UTERUS    . ESOPHAGOGASTRODUODENOSCOPY N/A 12/12/2014   Procedure:  ESOPHAGOGASTRODUODENOSCOPY (EGD);  Surgeon: Inda Castle, MD;  Location: [facility];  Service: Endoscopy;  Laterality: N/A;  with dilation  . ESOPHAGOGASTRODUODENOSCOPY (EGD) WITH PROPOFOL N/A 10/09/2014   Procedure: ESOPHAGOGASTRODUODENOSCOPY (EGD) WITH PROPOFOL;  Surgeon: Inda Castle, MD;  Location: [facility];  Service: Endoscopy;  Laterality: N/A;  . ESOPHAGOGASTRODUODENOSCOPY (EGD) WITH PROPOFOL N/A 09/19/2016   Procedure: ESOPHAGOGASTRODUODENOSCOPY (EGD) WITH PROPOFOL;  Surgeon: Doran Stabler, MD;  Location: [facility];  Service: Gastroenterology;  Laterality: N/A;  with savary dil tt  . FRACTURE SURGERY    . ORIF ANKLE FRACTURE Right 07/04/2015   Procedure: OPEN REDUCTION INTERNAL FIXATION (ORIF) ANKLE FRACTURE;  Surgeon: Newt Minion, MD;  Location: [facility];  Service: Orthopedics;  Laterality: Right;  OPEN REDUCTION, INTERNAL FIXATION OF RIGHT ANKLE FRACTURE.   [name] ORIF ANKLE FRACTURE Left 02/10/2016   Procedure: OPEN REDUCTION INTERNAL FIXATION (ORIF) ANKLE FRACTURE;  Surgeon: Newt Minion, MD;  Location: [facility];  Service: Orthopedics;  Laterality: Left;  . SAVORY DILATION N/A 10/09/2014   Procedure: SAVORY DILATION;  Surgeon: Inda Castle, MD;  Location: [facility];  Service: Endoscopy;  Laterality: N/A;  . SAVORY DILATION N/A 09/19/2016   Procedure: SAVORY DILATION;  Surgeon: Doran Stabler, MD;  Location: [facility];  Service: Gastroenterology;  Laterality: N/A;  . TONSILLECTOMY      There were no vitals filed for  this visit.      Subjective Assessment - 04/04/17 1401    Subjective has a new puppy (4 months old) so is able to get out and walk more.  has been doing a lot today with [employer], [employer].     Pertinent History HTN, active R lung CA (pt reports it is now "dormant" not in remission), emphysema, neuropathy in B hands/feet, RA, hx of B ankle fracture s/p ORIFs, hx of metatarsal fx's    Patient Stated Goals Improve awareness when walking, improve balance, be  able to monitor her feet better   Currently in Pain? No/denies                         [facility] Adult PT Treatment/Exercise - 04/04/17 0001      Knee/Hip Exercises: Aerobic   Stepper SciFit L2.0 x 5 min; 4 extremities     Knee/Hip Exercises: Seated   Sit to Sand 10 reps;without UE support  with band to maintain neutral hip     Knee/Hip Exercises: Supine   Bridges Limitations x10   Straight Leg Raises Both;10 reps   Other Supine Knee/Hip Exercises hooklying single limb clamshell with red theraband x 10 bil   Other Supine Knee/Hip Exercises hooklying marching x10 bil with red theraband             Balance Exercises - 04/04/17 1428      Balance Exercises: Standing   Rockerboard Anterior/posterior;Lateral;Head turns;10 reps;Intermittent UE support  min A with min cues for hip strategy             PT Short Term Goals - 03/31/17 1452      PT SHORT TERM GOAL #1   Title Pt will be IND in HEP to improve balance, strength, and flexibility. TARGET DATE FOR ALL STGS: 03/29/17   Status Partially Met     PT SHORT TERM GOAL #2   Title Pt will improve DGI score to >/=18/24 to decr. falls risk.    Status Not Met     PT SHORT TERM GOAL #3   Title Pt will improve gait speed to >/=2.37f/sec. to safely amb. in the community, without AD.    Status Not Met     PT SHORT TERM GOAL #4   Title Pt will amb. 400' over even terrain, IND, to improve functional mobility.    Status Not Met           PT Long Term Goals - 03/01/17 1549      PT LONG TERM GOAL #1   Title Pt will verbalize understanding of falls risk prevention strategies to reduce risk of another falls. TARGET DATE FOR ALL LTGS: 04/26/17   Status New     PT LONG TERM GOAL #2   Title Pt will amb. 800' over even/uneven terrain (outdoors) with LRAD, at MOD I level to improve functional mobility.    Status New     PT LONG TERM GOAL #3   Title Pt will improve DGI score to >/=20/24 to decr. falls risk.     Status New     PT LONG TERM GOAL #4   Title Pt will perform floor to standing txfs at MOD I level to improve safety during functional mobility.    Status New               Plan - 04/04/17 1438    Clinical Impression Statement Session today focued on lower extremity strengthening and balance  exercises, needing min A for balance.  Overall progressing well given setback with hospitalization.  Has goals to walk [place] (going to [place] in Oct) without falling.  Will continue to benefit from PT to maximize function.    PT Treatment/Interventions ADLs/Self Care Home Management;Biofeedback;Electrical Stimulation;Cryotherapy;Neuromuscular re-education;Balance training;Therapeutic exercise;Manual techniques;Therapeutic activities;Functional mobility training;Stair training;Gait training;Orthotic Fit/Training;DME Instruction;Patient/family education;Vestibular   PT Next Visit Plan continue with high level balance-SLS, compliant surfaces, hip strengthening.   Consulted and Agree with Plan of Care Patient      Patient will benefit from skilled therapeutic intervention in order to improve the following deficits and impairments:  Abnormal gait, Decreased endurance, Decreased knowledge of use of DME, Increased edema, Postural dysfunction, Impaired flexibility, Decreased range of motion, Decreased balance, Decreased mobility, Decreased strength  Visit Diagnosis: Other abnormalities of gait and mobility  Muscle weakness (generalized)  Other disturbances of skin sensation  Stiffness of right ankle, not elsewhere classified     Problem List Patient Active Problem List   Diagnosis Date Noted  . Altered mental status 03/22/2017  . Acute pyelonephritis 03/22/2017  . Hyponatremia 03/22/2017  . Hypokalemia 03/22/2017  . Primary osteoarthritis of both hands 03/08/2017  . High risk medication use 03/07/2017  . Facial laceration 02/07/2017  . Poor balance 02/07/2017  . Type 2  diabetes mellitus with diabetic polyneuropathy, with long-term current use of insulin ([place]) 01/11/2017  . Subarachnoid bleed ([place]) 01/11/2017  . Painful orthopaedic hardware ([place]) 01/05/2017  . Psoriatic arthropathy ([place]) 09/08/2016  . Essential hypertension 09/08/2016  . Diabetic neuropathy ([place]) 09/08/2016  . LADA (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), managed as type 1 ([place]) 05/04/2016  . Syncope 03/25/2016  . Malnutrition of moderate degree 02/29/2016  . Leukocytosis 02/17/2016  . Diabetic ketoacidosis with coma associated with diabetes mellitus due to underlying condition ([place])   . Abnormal finding on GI tract imaging   . Diarrhea   . Hematochezia   . RLS (restless legs syndrome) 12/11/2015  . Liver dysfunction 08/11/2015  . Lumbago 05/06/2015  . Peripheral edema 04/14/2015  . Psoriasis 04/14/2015  . Encounter for antineoplastic immunotherapy 04/06/2015  . Emphysema of lung ([place]) 11/26/2014  . Radiation-induced esophageal stricture 10/01/2014  . Tachycardia 08/27/2014  . Depression 08/27/2014  . Palliative care encounter 08/21/2014  . DNR (do not resuscitate) discussion 08/21/2014  . Neuropathic pain 08/21/2014  . Cancer associated pain 07/29/2014  . Malignant cachexia ([place]) 07/29/2014  . Lung collapse 07/29/2014  . Fatigue 07/18/2014  . Neuropathy due to chemotherapeutic drug ([place]) 07/18/2014  . Leg cramps 07/18/2014  . Hypoalbuminemia 07/18/2014  . Thrombocytopenia, unspecified ([place]) 07/18/2014  . Dyspnea 04/28/2014  . Radiation esophagitis 04/10/2014  . Right-sided chest wall pain 04/01/2014  . Cancer of middle lobe of lung ([place]) 01/17/2014  . Malignant neoplasm of right upper lobe of lung ([place]) 11/28/2013      Laureen Abrahams, PT, DPT 04/04/17 2:49 PM    [facility] [address] [place], [place], [address] Phone: [phone]   Fax:  [phone]  Name: KIRBIE STODGHILL MRN:  [id number] Date of Birth: 1941/09/10

## 2017-04-05 ENCOUNTER — Other Ambulatory Visit: Payer: Self-pay | Admitting: Neurology

## 2017-04-07 ENCOUNTER — Other Ambulatory Visit: Payer: Self-pay | Admitting: Internal Medicine

## 2017-04-13 ENCOUNTER — Ambulatory Visit: Payer: Medicare Other | Admitting: Rehabilitation

## 2017-04-14 ENCOUNTER — Ambulatory Visit: Payer: Medicare Other | Admitting: Physical Therapy

## 2017-04-14 ENCOUNTER — Encounter: Payer: Self-pay | Admitting: Physical Therapy

## 2017-04-14 DIAGNOSIS — R2689 Other abnormalities of gait and mobility: Secondary | ICD-10-CM

## 2017-04-14 NOTE — Therapy (Signed)
[facility] [address] [place], [place], [address] Phone: [phone]   Fax:  [phone]  Physical Therapy Treatment  Patient Details  Name: Megan [name] MRN: [id number] Date of Birth: [birth year] Referring Provider: Dr. Quay Burow  Encounter Date: 04/14/2017      PT End of Session - 04/14/17 0857    Visit Number 9   Number of Visits 17   Date for PT Re-Evaluation 04/30/17   Authorization Type Medicare: G-CODE AND PROGRESS NOTE EVERY 10TH VISIT.   PT Start Time 0803   PT Stop Time 0844   PT Time Calculation (min) 41 min   Equipment Utilized During Treatment Gait belt   Activity Tolerance Patient tolerated treatment well   Behavior During Therapy WFL for tasks assessed/performed      Past Medical History:  Diagnosis Date  . Cancer ([place]) dx'd 09/2013   Lung ca  . Diabetes ([place]) 09/08/2016   type I diabetic patient reported on 09/13/16   . Diabetes mellitus    insulin  . Diabetic neuropathy ([place]) 09/08/2016  . GERD (gastroesophageal reflux disease)    no meds for  . History of hiatal hernia   . Hx of radiation therapy 12/16/13-01/30/14   lung 66Gy  . Hypertension   . Malignant neoplasm of right upper lobe of lung ([place]) 11/28/2013  . Neuropathy   . Psoriasis   . Psoriatic arthritis ([place]) 09/08/2016  . Rheumatoid arthritis ([place])   . Shortness of breath dyspnea    with exertion    Past Surgical History:  Procedure Laterality Date  . ABDOMINAL HYSTERECTOMY    . ABDOMINAL SURGERY    . BALLOON DILATION N/A 12/12/2014   Procedure: BALLOON DILATION;  Surgeon: Inda Castle, MD;  Location: [facility];  Service: Endoscopy;  Laterality: N/A;  . CHOLECYSTECTOMY    . COLONOSCOPY N/A 02/07/2016   Procedure: COLONOSCOPY;  Surgeon: Doran Stabler, MD;  Location: [facility] ENDOSCOPY;  Service: Endoscopy;  Laterality: N/A;  . DILATION AND CURETTAGE OF UTERUS    . ESOPHAGOGASTRODUODENOSCOPY N/A 12/12/2014   Procedure:  ESOPHAGOGASTRODUODENOSCOPY (EGD);  Surgeon: Inda Castle, MD;  Location: [facility];  Service: Endoscopy;  Laterality: N/A;  with dilation  . ESOPHAGOGASTRODUODENOSCOPY (EGD) WITH PROPOFOL N/A 10/09/2014   Procedure: ESOPHAGOGASTRODUODENOSCOPY (EGD) WITH PROPOFOL;  Surgeon: Inda Castle, MD;  Location: [facility];  Service: Endoscopy;  Laterality: N/A;  . ESOPHAGOGASTRODUODENOSCOPY (EGD) WITH PROPOFOL N/A 09/19/2016   Procedure: ESOPHAGOGASTRODUODENOSCOPY (EGD) WITH PROPOFOL;  Surgeon: Doran Stabler, MD;  Location: [facility] ENDOSCOPY;  Service: Gastroenterology;  Laterality: N/A;  with savary dil tt  . FRACTURE SURGERY    . ORIF ANKLE FRACTURE Right 07/04/2015   Procedure: OPEN REDUCTION INTERNAL FIXATION (ORIF) ANKLE FRACTURE;  Surgeon: Newt Minion, MD;  Location: [facility];  Service: Orthopedics;  Laterality: Right;  OPEN REDUCTION, INTERNAL FIXATION OF RIGHT ANKLE FRACTURE.   [name] ORIF ANKLE FRACTURE Left 02/10/2016   Procedure: OPEN REDUCTION INTERNAL FIXATION (ORIF) ANKLE FRACTURE;  Surgeon: Newt Minion, MD;  Location: [facility];  Service: Orthopedics;  Laterality: Left;  . SAVORY DILATION N/A 10/09/2014   Procedure: SAVORY DILATION;  Surgeon: Inda Castle, MD;  Location: [facility];  Service: Endoscopy;  Laterality: N/A;  . SAVORY DILATION N/A 09/19/2016   Procedure: SAVORY DILATION;  Surgeon: Doran Stabler, MD;  Location: [facility];  Service: Gastroenterology;  Laterality: N/A;  . TONSILLECTOMY      There were no vitals filed for  this visit.      Subjective Assessment - 04/14/17 0805    Subjective No changes since last visit.  No falls.   Pertinent History HTN, active R lung CA (pt reports it is now "dormant" not in remission), emphysema, neuropathy in B hands/feet, RA, hx of B ankle fracture s/p ORIFs, hx of metatarsal fx's    Patient Stated Goals Improve awareness when walking, improve balance, be able to monitor her feet better   Currently in Pain? No/denies                          [facility] Adult PT Treatment/Exercise - 04/14/17 0001      High Level Balance   High Level Balance Comments Standing on solid surface EO x 30 sec, EC x 30 seconds (with increased sway); standing on foam EO x 30 seconds; unable to maintain EC >3 seconds (indicates possible decreased vestibular system use for balance)     Self-Care   Self-Care Other Self-Care Comments   Other Self-Care Comments  Discussed difficulties that patient notices in daily activities in regards to balance-mostly, it is difficulty with outdoor, grassly, unlevel surfaces.  Discussed results of EO/EC on solid and foam surfaces, which likely indicates decreased vestibular system use for balance.  Discussed how to address in future sessions.             Balance Exercises - 04/14/17 0824      Balance Exercises: Standing   Standing Eyes Opened Wide (BOA);Narrow base of support (BOS);Foam/compliant surface;Head turns;5 reps  Head nods x 5 reps   Rockerboard Anterior/posterior;Lateral;EO  Hip/ankle strategy work, arm swing, trunk rotation   Balance Beam Marching in place x 10 reps, then alternating leg kicks x10, alternating step taps x 10 forward and back   Tandem Gait Forward;Intermittent upper extremity support;2 reps  length of counter   Marching Limitations Marching forwad>tandem marching in parallel bars, intermittent UE support, 2-3 reps length of bars   Other Standing Exercises On compliant mat surface:  alternating step taps to cones 2 sets of 10, with intermittent UE support.             PT Education - 04/14/17 0856    Education provided Yes   Education Details Educated on different balance systems for balance, likely decreased in vestibular system use given pt's inability to hold balance EC on foam; discussed safety with outdoor/grass negotiation   Person(s) Educated Patient   Methods Explanation   Comprehension Verbalized understanding          PT Short Term  Goals - 03/31/17 1452      PT SHORT TERM GOAL #1   Title Pt will be IND in HEP to improve balance, strength, and flexibility. TARGET DATE FOR ALL STGS: 03/29/17   Status Partially Met     PT SHORT TERM GOAL #2   Title Pt will improve DGI score to >/=18/24 to decr. falls risk.    Status Not Met     PT SHORT TERM GOAL #3   Title Pt will improve gait speed to >/=2.2f/sec. to safely amb. in the community, without AD.    Status Not Met     PT SHORT TERM GOAL #4   Title Pt will amb. 400' over even terrain, IND, to improve functional mobility.    Status Not Met           PT Long Term Goals - 03/01/17 1549      PT  LONG TERM GOAL #1   Title Pt will verbalize understanding of falls risk prevention strategies to reduce risk of another falls. TARGET DATE FOR ALL LTGS: 04/26/17   Status New     PT LONG TERM GOAL #2   Title Pt will amb. 800' over even/uneven terrain (outdoors) with LRAD, at MOD I level to improve functional mobility.    Status New     PT LONG TERM GOAL #3   Title Pt will improve DGI score to >/=20/24 to decr. falls risk.    Status New     PT LONG TERM GOAL #4   Title Pt will perform floor to standing txfs at MOD I level to improve safety during functional mobility.    Status New               Plan - 04/14/17 2010    Clinical Impression Statement Skilled PT session today focused on compliant surface balance activities incorporating SLS, head turns, decreased UE support.  At session's end, performed EO/EC on solid and foam surfaces.  Pt unable to maintain EC on foam> 3 seconds, indicating likely decreased vestibular system use for balance.  Pt may benefit from skilled PT to address vestibular system with balance and to continue to work on strengthening and gait.   PT Treatment/Interventions ADLs/Self Care Home Management;Biofeedback;Electrical Stimulation;Cryotherapy;Neuromuscular re-education;Balance training;Therapeutic exercise;Manual techniques;Therapeutic  activities;Functional mobility training;Stair training;Gait training;Orthotic Fit/Training;DME Instruction;Patient/family education;Vestibular   PT Next Visit Plan GCODE next visit; work on decreased vestibular system with corner balance exercises (?Sensory Organization test for formal measures?)   Consulted and Agree with Plan of Care Patient      Patient will benefit from skilled therapeutic intervention in order to improve the following deficits and impairments:  Abnormal gait, Decreased endurance, Decreased knowledge of use of DME, Increased edema, Postural dysfunction, Impaired flexibility, Decreased range of motion, Decreased balance, Decreased mobility, Decreased strength  Visit Diagnosis: Other abnormalities of gait and mobility     Problem List Patient Active Problem List   Diagnosis Date Noted  . Altered mental status 03/22/2017  . Acute pyelonephritis 03/22/2017  . Hyponatremia 03/22/2017  . Hypokalemia 03/22/2017  . Primary osteoarthritis of both hands 03/08/2017  . High risk medication use 03/07/2017  . Facial laceration 02/07/2017  . Poor balance 02/07/2017  . Type 2 diabetes mellitus with diabetic polyneuropathy, with long-term current use of insulin ([place]) 01/11/2017  . Subarachnoid bleed ([place]) 01/11/2017  . Painful orthopaedic hardware ([place]) 01/05/2017  . Psoriatic arthropathy ([place]) 09/08/2016  . Essential hypertension 09/08/2016  . Diabetic neuropathy ([place]) 09/08/2016  . LADA (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), managed as type 1 ([place]) 05/04/2016  . Syncope 03/25/2016  . Malnutrition of moderate degree 02/29/2016  . Leukocytosis 02/17/2016  . Diabetic ketoacidosis with coma associated with diabetes mellitus due to underlying condition ([place])   . Abnormal finding on GI tract imaging   . Diarrhea   . Hematochezia   . RLS (restless legs syndrome) 12/11/2015  . Liver dysfunction 08/11/2015  . Lumbago 05/06/2015  . Peripheral edema 04/14/2015  . Psoriasis  04/14/2015  . Encounter for antineoplastic immunotherapy 04/06/2015  . Emphysema of lung ([place]) 11/26/2014  . Radiation-induced esophageal stricture 10/01/2014  . Tachycardia 08/27/2014  . Depression 08/27/2014  . Palliative care encounter 08/21/2014  . DNR (do not resuscitate) discussion 08/21/2014  . Neuropathic pain 08/21/2014  . Cancer associated pain 07/29/2014  . Malignant cachexia ([place]) 07/29/2014  . Lung collapse 07/29/2014  . Fatigue 07/18/2014  . Neuropathy due  to chemotherapeutic drug ([place]) 07/18/2014  . Leg cramps 07/18/2014  . Hypoalbuminemia 07/18/2014  . Thrombocytopenia, unspecified ([place]) 07/18/2014  . Dyspnea 04/28/2014  . Radiation esophagitis 04/10/2014  . Right-sided chest wall pain 04/01/2014  . Cancer of middle lobe of lung ([place]) 01/17/2014  . Malignant neoplasm of right upper lobe of lung ([place]) 11/28/2013    MARRIOTT,AMY W. 04/14/2017, 9:02 AM Frazier Butt., PT  [facility] [address] [place], [place], [address] Phone: [phone]   Fax:  [phone]  Name: Megan [name] MRN: [id number] Date of Birth: [birth year]

## 2017-04-17 ENCOUNTER — Ambulatory Visit: Payer: Medicare Other | Admitting: Rehabilitation

## 2017-04-20 ENCOUNTER — Ambulatory Visit: Payer: Medicare Other | Admitting: Rehabilitation

## 2017-04-20 ENCOUNTER — Encounter: Payer: Self-pay | Admitting: Rehabilitation

## 2017-04-20 DIAGNOSIS — M6281 Muscle weakness (generalized): Secondary | ICD-10-CM

## 2017-04-20 DIAGNOSIS — R208 Other disturbances of skin sensation: Secondary | ICD-10-CM

## 2017-04-20 DIAGNOSIS — R2689 Other abnormalities of gait and mobility: Secondary | ICD-10-CM | POA: Diagnosis not present

## 2017-04-20 NOTE — Therapy (Signed)
[facility] [address] [place], [place], [address] Phone: [phone]   Fax:  [phone]  Physical Therapy Treatment and D/C Summary  Patient Details  Name: Megan [name] MRN: [id number] Date of Birth: 1941-08-21 Referring Provider: Dr. Quay Burow  Encounter Date: 04/20/2017      PT End of Session - 04/20/17 0851    Visit Number 10   Number of Visits 17   Date for PT Re-Evaluation 04/30/17   Authorization Type Medicare: G-CODE AND PROGRESS NOTE EVERY 10TH VISIT.   PT Start Time [phone]  D/C visit, did not need whole time   PT Stop Time 0920   PT Time Calculation (min) 34 min   Equipment Utilized During Treatment Gait belt   Activity Tolerance Patient tolerated treatment well   Behavior During Therapy WFL for tasks assessed/performed      Past Medical History:  Diagnosis Date  . Cancer ([place]) dx'd 09/2013   Lung ca  . Diabetes ([place]) 09/08/2016   type I diabetic patient reported on 09/13/16   . Diabetes mellitus    insulin  . Diabetic neuropathy ([place]) 09/08/2016  . GERD (gastroesophageal reflux disease)    no meds for  . History of hiatal hernia   . Hx of radiation therapy 12/16/13-01/30/14   lung 66Gy  . Hypertension   . Malignant neoplasm of right upper lobe of lung ([place]) 11/28/2013  . Neuropathy   . Psoriasis   . Psoriatic arthritis ([place]) 09/08/2016  . Rheumatoid arthritis ([place])   . Shortness of breath dyspnea    with exertion    Past Surgical History:  Procedure Laterality Date  . ABDOMINAL HYSTERECTOMY    . ABDOMINAL SURGERY    . BALLOON DILATION N/A 12/12/2014   Procedure: BALLOON DILATION;  Surgeon: Inda Castle, MD;  Location: [facility];  Service: Endoscopy;  Laterality: N/A;  . CHOLECYSTECTOMY    . COLONOSCOPY N/A 02/07/2016   Procedure: COLONOSCOPY;  Surgeon: Doran Stabler, MD;  Location: [facility] ENDOSCOPY;  Service: Endoscopy;  Laterality: N/A;  . DILATION AND CURETTAGE OF UTERUS    .  ESOPHAGOGASTRODUODENOSCOPY N/A 12/12/2014   Procedure: ESOPHAGOGASTRODUODENOSCOPY (EGD);  Surgeon: Inda Castle, MD;  Location: [facility];  Service: Endoscopy;  Laterality: N/A;  with dilation  . ESOPHAGOGASTRODUODENOSCOPY (EGD) WITH PROPOFOL N/A 10/09/2014   Procedure: ESOPHAGOGASTRODUODENOSCOPY (EGD) WITH PROPOFOL;  Surgeon: Inda Castle, MD;  Location: [facility];  Service: Endoscopy;  Laterality: N/A;  . ESOPHAGOGASTRODUODENOSCOPY (EGD) WITH PROPOFOL N/A 09/19/2016   Procedure: ESOPHAGOGASTRODUODENOSCOPY (EGD) WITH PROPOFOL;  Surgeon: Doran Stabler, MD;  Location: [facility];  Service: Gastroenterology;  Laterality: N/A;  with savary dil tt  . FRACTURE SURGERY    . ORIF ANKLE FRACTURE Right 07/04/2015   Procedure: OPEN REDUCTION INTERNAL FIXATION (ORIF) ANKLE FRACTURE;  Surgeon: Newt Minion, MD;  Location: [facility];  Service: Orthopedics;  Laterality: Right;  OPEN REDUCTION, INTERNAL FIXATION OF RIGHT ANKLE FRACTURE.   [name] ORIF ANKLE FRACTURE Left 02/10/2016   Procedure: OPEN REDUCTION INTERNAL FIXATION (ORIF) ANKLE FRACTURE;  Surgeon: Newt Minion, MD;  Location: [facility];  Service: Orthopedics;  Laterality: Left;  . SAVORY DILATION N/A 10/09/2014   Procedure: SAVORY DILATION;  Surgeon: Inda Castle, MD;  Location: [facility];  Service: Endoscopy;  Laterality: N/A;  . SAVORY DILATION N/A 09/19/2016   Procedure: SAVORY DILATION;  Surgeon: Doran Stabler, MD;  Location: [facility];  Service: Gastroenterology;  Laterality: N/A;  . TONSILLECTOMY  There were no vitals filed for this visit.      Subjective Assessment - 04/20/17 0850    Subjective Reports no changes since last visit.  No falls.    Pertinent History HTN, active R lung CA (pt reports it is now "dormant" not in remission), emphysema, neuropathy in B hands/feet, RA, hx of B ankle fracture s/p ORIFs, hx of metatarsal fx's    Patient Stated Goals Improve awareness when walking, improve balance, be able to  monitor her feet better   Currently in Pain? No/denies                         [facility] Adult PT Treatment/Exercise - 04/20/17 0903      Ambulation/Gait   Ambulation/Gait Yes   Ambulation/Gait Assistance 6: Modified independent (Device/Increase time)   Ambulation/Gait Assistance Details Assessed LTG with gait over outdoor surfaces with SPC.  Note she has on her diabetic (more supportive) shoes today and was able to ambulate up to 900' at mod I level.  Note good confidence in her walking.  She did have one single mild LOB, however was able to self correct without assist or concern for a fall from PT>    Ambulation Distance (Feet) 900 Feet   Assistive device Straight cane   Gait Pattern Step-through pattern;Decreased stride length;Decreased dorsiflexion - left;Decreased dorsiflexion - right;Decreased arm swing - right;Decreased arm swing - left   Ambulation Surface Level;Indoor;Unlevel;Outdoor;Paved;Gravel;Grass   Stairs Yes   Stairs Assistance 6: Modified independent (Device/Increase time)   Stair Management Technique One rail Right;Alternating pattern;Forwards     Standardized Balance Assessment   Standardized Balance Assessment Dynamic Gait Index     Dynamic Gait Index   Level Surface Normal   Change in Gait Speed Mild Impairment   Gait with Horizontal Head Turns Normal  for her normal gait pattern   Gait with Vertical Head Turns Normal  for her normal gait pattern   Gait and Pivot Turn Normal   Step Over Obstacle Mild Impairment   Step Around Obstacles Normal   Steps Mild Impairment   Total Score 21     Self-Care   Self-Care Other Self-Care Comments   Other Self-Care Comments  Pt able to verbalize understanding of fall prevention strategies and stated that she was able to get up from the floor (she got into floor to retrieve item under bed) by  herself as taught here in therapy.                    PT Short Term Goals - 03/31/17 1452      PT SHORT  TERM GOAL #1   Title Pt will be IND in HEP to improve balance, strength, and flexibility. TARGET DATE FOR ALL STGS: 03/29/17   Status Partially Met     PT SHORT TERM GOAL #2   Title Pt will improve DGI score to >/=18/24 to decr. falls risk.    Status Not Met     PT SHORT TERM GOAL #3   Title Pt will improve gait speed to >/=2.79f/sec. to safely amb. in the community, without AD.    Status Not Met     PT SHORT TERM GOAL #4   Title Pt will amb. 400' over even terrain, IND, to improve functional mobility.    Status Not Met           PT Long Term Goals - 04/20/17 0855      PT LONG  TERM GOAL #1   Title Pt will verbalize understanding of falls risk prevention strategies to reduce risk of another falls. TARGET DATE FOR ALL LTGS: 04/26/17   Baseline met Apr 27, 2017   Status Achieved     PT LONG TERM GOAL #2   Title Pt will amb. 800' over even/uneven terrain (outdoors) with LRAD, at MOD I level to improve functional mobility.    Baseline met 04/27/2017   Status Achieved     PT LONG TERM GOAL #3   Title Pt will improve DGI score to >/=20/24 to decr. falls risk.    Baseline 21/24 on 2017-04-27   Status Achieved     PT LONG TERM GOAL #4   Title Pt will perform floor to standing txfs at MOD I level to improve safety during functional mobility.    Baseline met 04/27/17   Status Achieved               Plan - 04/27/17 7353    Clinical Impression Statement Skilled session focused on addressing LTGs as she wishes to DC at this time due to having several appts/things going.  Note she has met 4/4 LTGs and has made excellent progress with balance and confidence in her balance.  She feels that she would like to return to PT in the fall and therefore educated to have Dr. Quay Burow write another order for PT at that time.   Pt verbalized understanding.    PT Treatment/Interventions ADLs/Self Care Home Management;Biofeedback;Electrical Stimulation;Cryotherapy;Neuromuscular re-education;Balance  training;Therapeutic exercise;Manual techniques;Therapeutic activities;Functional mobility training;Stair training;Gait training;Orthotic Fit/Training;DME Instruction;Patient/family education;Vestibular   PT Next Visit Plan n/a   Consulted and Agree with Plan of Care Patient      Patient will benefit from skilled therapeutic intervention in order to improve the following deficits and impairments:  Abnormal gait, Decreased endurance, Decreased knowledge of use of DME, Increased edema, Postural dysfunction, Impaired flexibility, Decreased range of motion, Decreased balance, Decreased mobility, Decreased strength  Visit Diagnosis: Other abnormalities of gait and mobility  Muscle weakness (generalized)  Other disturbances of skin sensation       G-Codes - 04-27-17 0922    Functional Assessment Tool Used (Outpatient Only) DGI: 21/24, ABC:    Functional Limitation Mobility: Walking and moving around   Mobility: Walking and Moving Around Current Status [phone]) At least 1 percent but less than 20 percent impaired, limited or restricted   Mobility: Walking and Moving Around Goal Status [phone]) At least 1 percent but less than 20 percent impaired, limited or restricted   Mobility: Walking and Moving Around Discharge Status [phone]) At least 1 percent but less than 20 percent impaired, limited or restricted      PHYSICAL THERAPY DISCHARGE SUMMARY  Visits from Start of Care: 10  Current functional level related to goals / functional outcomes: See LTGs above   Remaining deficits: Pt with high level balance and endurance deficits.  Has HEP and walking program to address these.    Education / Equipment: HEP  Plan: Patient agrees to discharge.  Patient goals were met. Patient is being discharged due to meeting the stated rehab goals.  ?????         Problem List Patient Active Problem List   Diagnosis Date Noted  . Altered mental status 03/22/2017  . Acute pyelonephritis 03/22/2017   . Hyponatremia 03/22/2017  . Hypokalemia 03/22/2017  . Primary osteoarthritis of both hands 03/08/2017  . High risk medication use 03/07/2017  . Facial laceration 02/07/2017  . Poor balance 02/07/2017  .  Type 2 diabetes mellitus with diabetic polyneuropathy, with long-term current use of insulin ([place]) 01/11/2017  . Subarachnoid bleed ([place]) 01/11/2017  . Painful orthopaedic hardware ([place]) 01/05/2017  . Psoriatic arthropathy ([place]) 09/08/2016  . Essential hypertension 09/08/2016  . Diabetic neuropathy ([place]) 09/08/2016  . LADA (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), managed as type 1 ([place]) 05/04/2016  . Syncope 03/25/2016  . Malnutrition of moderate degree 02/29/2016  . Leukocytosis 02/17/2016  . Diabetic ketoacidosis with coma associated with diabetes mellitus due to underlying condition ([place])   . Abnormal finding on GI tract imaging   . Diarrhea   . Hematochezia   . RLS (restless legs syndrome) 12/11/2015  . Liver dysfunction 08/11/2015  . Lumbago 05/06/2015  . Peripheral edema 04/14/2015  . Psoriasis 04/14/2015  . Encounter for antineoplastic immunotherapy 04/06/2015  . Emphysema of lung ([place]) 11/26/2014  . Radiation-induced esophageal stricture 10/01/2014  . Tachycardia 08/27/2014  . Depression 08/27/2014  . Palliative care encounter 08/21/2014  . DNR (do not resuscitate) discussion 08/21/2014  . Neuropathic pain 08/21/2014  . Cancer associated pain 07/29/2014  . Malignant cachexia ([place]) 07/29/2014  . Lung collapse 07/29/2014  . Fatigue 07/18/2014  . Neuropathy due to chemotherapeutic drug ([place]) 07/18/2014  . Leg cramps 07/18/2014  . Hypoalbuminemia 07/18/2014  . Thrombocytopenia, unspecified ([place]) 07/18/2014  . Dyspnea 04/28/2014  . Radiation esophagitis 04/10/2014  . Right-sided chest wall pain 04/01/2014  . Cancer of middle lobe of lung ([place]) 01/17/2014  . Malignant neoplasm of right upper lobe of lung ([place]) 11/28/2013   Cameron Sprang, PT, MPT [facility] [address] [place], [place], [address] Phone: [phone]   Fax:  [phone] 04/20/17, 10:31 AM  Name: LASHICA HANNAY MRN: [id number] Date of Birth: [birth year]

## 2017-04-21 ENCOUNTER — Ambulatory Visit: Payer: Medicare Other | Admitting: Rehabilitation

## 2017-04-24 ENCOUNTER — Other Ambulatory Visit: Payer: Self-pay | Admitting: Internal Medicine

## 2017-04-24 ENCOUNTER — Ambulatory Visit: Payer: Medicare Other | Admitting: Rehabilitation

## 2017-04-27 ENCOUNTER — Ambulatory Visit: Payer: Medicare Other | Admitting: Rehabilitation

## 2017-05-03 ENCOUNTER — Other Ambulatory Visit: Payer: Self-pay | Admitting: Neurology

## 2017-05-04 NOTE — Telephone Encounter (Signed)
Rx printed, signed and faxed to pharmacy.

## 2017-05-08 ENCOUNTER — Other Ambulatory Visit: Payer: Medicare Other

## 2017-05-08 ENCOUNTER — Other Ambulatory Visit: Payer: Self-pay

## 2017-05-08 ENCOUNTER — Other Ambulatory Visit (HOSPITAL_BASED_OUTPATIENT_CLINIC_OR_DEPARTMENT_OTHER): Payer: Medicare Other

## 2017-05-08 DIAGNOSIS — C3411 Malignant neoplasm of upper lobe, right bronchus or lung: Secondary | ICD-10-CM

## 2017-05-08 DIAGNOSIS — Z85118 Personal history of other malignant neoplasm of bronchus and lung: Secondary | ICD-10-CM

## 2017-05-08 DIAGNOSIS — Z79899 Other long term (current) drug therapy: Secondary | ICD-10-CM

## 2017-05-08 LAB — COMPREHENSIVE METABOLIC PANEL
ALK PHOS: 161 U/L — AB (ref 40–150)
ALT: 22 U/L (ref 0–55)
ANION GAP: 12 meq/L — AB (ref 3–11)
AST: 20 U/L (ref 5–34)
Albumin: 3.7 g/dL (ref 3.5–5.0)
BILIRUBIN TOTAL: 0.45 mg/dL (ref 0.20–1.20)
BUN: 16.8 mg/dL (ref 7.0–26.0)
CO2: 28 mEq/L (ref 22–29)
CREATININE: 0.8 mg/dL (ref 0.6–1.1)
Calcium: 9.5 mg/dL (ref 8.4–10.4)
Chloride: 103 mEq/L (ref 98–109)
EGFR: 70 mL/min/{1.73_m2} — ABNORMAL LOW (ref >90)
Glucose: 95 mg/dl (ref 70–140)
Potassium: 3.6 mEq/L (ref 3.5–5.1)
Sodium: 144 mEq/L (ref 136–145)
TOTAL PROTEIN: 6.8 g/dL (ref 6.4–8.3)

## 2017-05-08 LAB — CBC WITH DIFFERENTIAL/PLATELET
BASO%: 0.9 % (ref 0.0–2.0)
Basophils Absolute: 0.1 10*3/uL (ref 0.0–0.1)
EOS ABS: 0.5 10*3/uL (ref 0.0–0.5)
EOS%: 6.4 % (ref 0.0–7.0)
HEMATOCRIT: 40.1 % (ref 34.8–46.6)
HGB: 13.2 g/dL (ref 11.6–15.9)
LYMPH%: 14.3 % (ref 14.0–49.7)
MCH: 29.2 pg (ref 25.1–34.0)
MCHC: 32.9 g/dL (ref 31.5–36.0)
MCV: 88.9 fL (ref 79.5–101.0)
MONO#: 0.6 10*3/uL (ref 0.1–0.9)
MONO%: 7.8 % (ref 0.0–14.0)
NEUT#: 5.7 10*3/uL (ref 1.5–6.5)
NEUT%: 70.6 % (ref 38.4–76.8)
PLATELETS: 188 10*3/uL (ref 145–400)
RBC: 4.5 10*6/uL (ref 3.70–5.45)
RDW: 18.5 % — AB (ref 11.2–14.5)
WBC: 8.1 10*3/uL (ref 3.9–10.3)
lymph#: 1.2 10*3/uL (ref 0.9–3.3)

## 2017-05-08 LAB — TECHNOLOGIST REVIEW: Technologist Review: 3

## 2017-05-08 MED ORDER — INSULIN DETEMIR 100 UNIT/ML FLEXPEN: PEN_INJECTOR | SUBCUTANEOUS | 1 refills | Status: DC

## 2017-05-10 ENCOUNTER — Ambulatory Visit (HOSPITAL_BASED_OUTPATIENT_CLINIC_OR_DEPARTMENT_OTHER): Payer: Medicare Other | Admitting: Internal Medicine

## 2017-05-10 ENCOUNTER — Encounter: Payer: Self-pay | Admitting: Internal Medicine

## 2017-05-10 ENCOUNTER — Other Ambulatory Visit: Payer: Self-pay | Admitting: Internal Medicine

## 2017-05-10 ENCOUNTER — Telehealth: Payer: Self-pay | Admitting: Internal Medicine

## 2017-05-10 VITALS — BP 152/98 | HR 103 | Temp 97.6°F | Ht 65.5 in | Wt 182.8 lb

## 2017-05-10 DIAGNOSIS — C3411 Malignant neoplasm of upper lobe, right bronchus or lung: Secondary | ICD-10-CM

## 2017-05-10 DIAGNOSIS — Z85118 Personal history of other malignant neoplasm of bronchus and lung: Secondary | ICD-10-CM | POA: Diagnosis not present

## 2017-05-10 DIAGNOSIS — C342 Malignant neoplasm of middle lobe, bronchus or lung: Secondary | ICD-10-CM

## 2017-05-10 NOTE — Progress Notes (Signed)
[place] [phone]   [phone]  OFFICE PROGRESS NOTE  Binnie Rail, MD [place] [address]  DIAGNOSIS: Squamous cell lung cancer  Primary site: Lung (Right)  Staging method: AJCC 7th Edition  Clinical: Stage IIIB (T3, N3, M0)  Summary: Stage IIIB (T3, N3, M0)   PRIOR THERAPY:  1) Concurrent chemoradiation with weekly chemotherapy in the form of carboplatin for AUC of 2 and paclitaxel 45 mg/m2. Status post 7 week of treatment. Last dose was given 01/27/2014 no significant response in her disease.  2) Consolidation chemotherapy with carboplatin for AUC of 5 and paclitaxel 175 mg/M2 every 3 weeks with Neulasta support. First dose on 04/15/2014. Status post 3 cycles. Carboplatin was discontinued starting cycle #2 secondary to hypersensitivity reaction. 3) Immunotherapy with Opdivo (Nivolumab) 3mg /kg given every 2 weeks. Status post 33 cycles discontinued secondary to intolerance.  CURRENT THERAPY: Observation.  INTERVAL HISTORY: Megan [name] 76 y.o. female returns to the clinic today for follow-up visit. The patient is feeling fine with no specific complaints. She denied having any recent weight loss or night sweats. She has no nausea, vomiting, diarrhea or constipation. She has no fever or chills. She is currently on observation and feeling fine. She has no chest pain, shortness breath, cough or hemoptysis. She was supposed to have repeat CT scan of the chest before this visit for restaging of her disease but unfortunately her scan was not scheduled as ordered.  MEDICAL HISTORY: Past Medical History:  Diagnosis Date  . Cancer ([place]) dx'd 09/2013   Lung ca  . Diabetes ([place]) 09/08/2016   type I diabetic patient reported on 09/13/16   . Diabetes mellitus    insulin  . Diabetic neuropathy ([place]) 09/08/2016  . GERD (gastroesophageal reflux disease)    no meds for  . History of hiatal hernia   . Hx of radiation  therapy 12/16/13-01/30/14   lung 66Gy  . Hypertension   . Malignant neoplasm of right upper lobe of lung ([place]) 11/28/2013  . Neuropathy   . Psoriasis   . Psoriatic arthritis ([place]) 09/08/2016  . Rheumatoid arthritis ([place])   . Shortness of breath dyspnea    with exertion    ALLERGIES:  is allergic to carboplatin; remicade [infliximab]; ciprofloxacin; and hydromorphone.  MEDICATIONS:  Current Outpatient Prescriptions  Medication Sig Dispense Refill  . amLODipine (NORVASC) 2.5 MG tablet Take 1 tablet (2.5 mg total) by mouth daily. 90 tablet 3  . augmented betamethasone dipropionate (DIPROLENE-AF) 0.05 % ointment APP AA BID PRN  1  . B-D UF III MINI PEN NEEDLES 31G X 5 MM MISC USE TO CHECK SUGAR FIVE TIMES DAILY 100 each 0  . DULoxetine (CYMBALTA) 60 MG capsule Take 1 capsule (60 mg total) by mouth 2 (two) times daily. 60 capsule 12  . HUMALOG KWIKPEN 100 UNIT/ML KiwkPen ADMINISTER 8 TO 20 UNITS UNDER THE SKIN THREE TIMES DAILY 30 mL 0  . Insulin Detemir (LEVEMIR FLEXTOUCH) 100 UNIT/ML Pen Inject 15 units in the morning, and 25 units in the pm. 15 pen 1  . LYRICA 150 MG capsule TAKE 1 CAPSULE BY MOUTH TWICE DAILY 60 capsule 5  . sulfaSALAzine (AZULFIDINE) 500 MG EC tablet Take 2 tablets (1,000 mg total) by mouth 2 (two) times daily. 120 tablet 2   No current facility-administered medications for this visit.     SURGICAL HISTORY:  Past Surgical History:  Procedure Laterality Date  . ABDOMINAL HYSTERECTOMY    .  ABDOMINAL SURGERY    . BALLOON DILATION N/A 12/12/2014   Procedure: BALLOON DILATION;  Surgeon: Inda Castle, MD;  Location: [facility];  Service: Endoscopy;  Laterality: N/A;  . CHOLECYSTECTOMY    . COLONOSCOPY N/A 02/07/2016   Procedure: COLONOSCOPY;  Surgeon: Doran Stabler, MD;  Location: [facility] ENDOSCOPY;  Service: Endoscopy;  Laterality: N/A;  . DILATION AND CURETTAGE OF UTERUS    . ESOPHAGOGASTRODUODENOSCOPY N/A 12/12/2014   Procedure: ESOPHAGOGASTRODUODENOSCOPY (EGD);   Surgeon: Inda Castle, MD;  Location: [facility];  Service: Endoscopy;  Laterality: N/A;  with dilation  . ESOPHAGOGASTRODUODENOSCOPY (EGD) WITH PROPOFOL N/A 10/09/2014   Procedure: ESOPHAGOGASTRODUODENOSCOPY (EGD) WITH PROPOFOL;  Surgeon: Inda Castle, MD;  Location: [place];  Service: Endoscopy;  Laterality: N/A;  . ESOPHAGOGASTRODUODENOSCOPY (EGD) WITH PROPOFOL N/A 09/19/2016   Procedure: ESOPHAGOGASTRODUODENOSCOPY (EGD) WITH PROPOFOL;  Surgeon: Doran Stabler, MD;  Location: [facility] END[facility]COPY;  Service: Gastroenterology;  Laterality: N/A;  with savary dil tt  . FRACTURE SURGERY    . ORIF ANKLE FRACTURE Right 07/04/2015   Procedure: OPEN REDUCTION INTERNAL FIXATION (ORIF) ANKLE FRACTURE;  Surgeon: Newt Minion, MD;  Location: [place];  Service: Orthopedics;  Laterality: Right;  OPEN REDUCTION, INTERNAL FIXATION OF RIGHT ANKLE FRACTURE.   [name] ORIF ANKLE FRACTURE Left 02/10/2016   Procedure: OPEN REDUCTION INTERNAL FIXATION (ORIF) ANKLE FRACTURE;  Surgeon: Newt Minion, MD;  Location: [place];  Service: Orthopedics;  Laterality: Left;  . SAVORY DILATION N/A 10/09/2014   Procedure: SAVORY DILATION;  Surgeon: Inda Castle, MD;  Location: [facility];  Service: Endoscopy;  Laterality: N/A;  . SAVORY DILATION N/A 09/19/2016   Procedure: SAVORY DILATION;  Surgeon: Doran Stabler, MD;  Location: [facility];  Service: Gastroenterology;  Laterality: N/A;  . TONSILLECTOMY      REVIEW OF SYSTEMS:  A comprehensive review of systems was negative except for: Musculoskeletal: positive for arthralgias   PHYSICAL EXAMINATION: General appearance: alert, cooperative and no distress Head: Normocephalic, without obvious abnormality, atraumatic Neck: no adenopathy, no JVD, supple, symmetrical, trachea midline and thyroid not enlarged, symmetric, no tenderness/mass/nodules Lymph nodes: Cervical, supraclavicular, and axillary nodes normal. Resp: clear to auscultation bilaterally Back: symmetric, no  curvature. ROM normal. No CVA tenderness. Cardio: regular rate and rhythm, S1, S2 normal, no murmur, click, rub or gallop GI: soft, non-tender; bowel sounds normal; no masses,  no organomegaly Extremities: extremities normal, atraumatic, no cyanosis or edema    ECOG PERFORMANCE STATUS: 1 - Symptomatic but completely ambulatory  Blood pressure (!) 152/98, pulse (!) 103, temperature 97.6 F (36.4 C), height 5' 5.5" (1.664 m), weight 182 lb 12.8 oz (82.9 kg), SpO2 95 %.  LABORATORY DATA: Lab Results  Component Value Date   WBC 8.1 05/08/2017   HGB 13.2 05/08/2017   HCT 40.1 05/08/2017   MCV 88.9 05/08/2017   PLT 188 05/08/2017      Chemistry      Component Value Date/Time   NA 144 05/08/2017 0803   K 3.6 05/08/2017 0803   CL 103 03/25/2017 0500   CO2 28 05/08/2017 0803   BUN 16.8 05/08/2017 0803   CREATININE 0.8 05/08/2017 0803   GLU 176 03/07/2016      Component Value Date/Time   CALCIUM 9.5 05/08/2017 0803   ALKPHOS 161 (H) 05/08/2017 0803   AST 20 05/08/2017 0803   ALT 22 05/08/2017 0803   BILITOT 0.45 05/08/2017 0803       RADIOGRAPHIC STUDIES: No results found.  ASSESSMENT AND PLAN:  This is a very pleasant 76 years old white female with metastatic non-small cell lung cancer status post a course of concurrent chemoradiation as well as consolidation chemotherapy discontinued secondary to intolerance and significant neuropathy. The patient was treated with immunotherapy with Nivolumab status post 33 cycles and tolerated the treatment well but it was discontinued secondary to chemotherapy-induced colitis and diarrhea. She is currently on observation and feeling fine but she did not have the restaging CT scan of the chest as previously ordered. I will arrange for the patient to have her scan performed in the next few days and if there is no evidence for disease progression, I would see her back for follow-up visit in 4 months for reevaluation and repeat CT scan of the  chest. She was advised to call immediately if she has any concerning symptoms in the interval. The patient voices understanding of current disease status and treatment options and is in agreement with the current care plan. All questions were answered. The patient knows to call the clinic with any problems, questions or concerns. We can certainly see the patient much sooner if necessary. I spent 10 minutes counseling the patient face to face. The total time spent in the appointment was 15 minutes.  Disclaimer: This note was dictated with voice recognition software. Similar sounding words can inadvertently be transcribed and may not be corrected upon review.

## 2017-05-10 NOTE — Telephone Encounter (Signed)
Scheduled appt per 7/11 los - Gave patient AVS and calender per [facility] to contact patient with CT schedule.

## 2017-05-15 ENCOUNTER — Ambulatory Visit (HOSPITAL_COMMUNITY): Admission: RE | Admit: 2017-05-15 | Payer: Medicare Other | Source: Ambulatory Visit

## 2017-05-17 ENCOUNTER — Ambulatory Visit (HOSPITAL_COMMUNITY)
Admission: RE | Admit: 2017-05-17 | Discharge: 2017-05-17 | Disposition: A | Discharge status: Home / Self Care | Payer: Medicare Other | Source: Ambulatory Visit | Attending: Internal Medicine | Admitting: Internal Medicine

## 2017-05-17 ENCOUNTER — Encounter (HOSPITAL_COMMUNITY): Payer: Self-pay

## 2017-05-17 DIAGNOSIS — I700 Atherosclerosis of aorta: Secondary | ICD-10-CM | POA: Insufficient documentation

## 2017-05-17 DIAGNOSIS — J9811 Atelectasis: Secondary | ICD-10-CM | POA: Insufficient documentation

## 2017-05-17 DIAGNOSIS — J90 Pleural effusion, not elsewhere classified: Secondary | ICD-10-CM | POA: Insufficient documentation

## 2017-05-17 DIAGNOSIS — Z9889 Other specified postprocedural states: Secondary | ICD-10-CM | POA: Insufficient documentation

## 2017-05-17 DIAGNOSIS — C3411 Malignant neoplasm of upper lobe, right bronchus or lung: Secondary | ICD-10-CM

## 2017-05-17 DIAGNOSIS — I2510 Atherosclerotic heart disease of native coronary artery without angina pectoris: Secondary | ICD-10-CM | POA: Insufficient documentation

## 2017-05-17 MED ORDER — IOPAMIDOL (ISOVUE-300) INJECTION 61%
INTRAVENOUS | Status: AC
Start: 2017-05-17 — End: 2017-05-17
  Filled 2017-05-17: qty 75

## 2017-05-17 MED ORDER — IOPAMIDOL (ISOVUE-300) INJECTION 61%
75.0000 mL | Freq: Once | INTRAVENOUS | Status: AC | PRN
  Administered 2017-05-17: 75 mL via INTRAVENOUS

## 2017-05-18 ENCOUNTER — Encounter: Payer: Self-pay | Admitting: Internal Medicine

## 2017-05-18 ENCOUNTER — Ambulatory Visit (INDEPENDENT_AMBULATORY_CARE_PROVIDER_SITE_OTHER): Payer: Medicare Other | Admitting: Internal Medicine

## 2017-05-18 VITALS — BP 128/82 | HR 106 | Wt 184.0 lb

## 2017-05-18 DIAGNOSIS — E139 Other specified diabetes mellitus without complications: Secondary | ICD-10-CM

## 2017-05-18 DIAGNOSIS — E109 Type 1 diabetes mellitus without complications: Secondary | ICD-10-CM | POA: Diagnosis not present

## 2017-05-18 LAB — POCT GLYCOSYLATED HEMOGLOBIN (HGB A1C): HEMOGLOBIN A1C: 7.3

## 2017-05-18 MED ORDER — INSULIN DETEMIR 100 UNIT/ML FLEXPEN: PEN_INJECTOR | SUBCUTANEOUS | 3 refills | Status: DC

## 2017-05-18 MED ORDER — INSULIN LISPRO 100 UNIT/ML (KWIKPEN): PEN_INJECTOR | SUBCUTANEOUS | 3 refills | Status: DC

## 2017-05-18 NOTE — Addendum Note (Signed)
Addended by: Caprice Beaver T on: 05/18/2017 03:16 PM   Modules accepted: Orders

## 2017-05-18 NOTE — Patient Instructions (Addendum)
Please change the regimen: - Levemir to 20 units in am and 25 at bedtime - Humalog mealtime dose  - small meal: 16 units  - regular meal: 20 units  - larger meal: 26 units  Please do not skip the insulin if sugars are 70-100.   If the sugars are <70, take 1/2 of the Humalog dose.  Please return in 3 months with your sugar log.

## 2017-05-18 NOTE — Progress Notes (Signed)
Patient ID: Megan [name], female   DOB: [birth year], 76 y.o.   MRN: [id number]  HPI: Megan [name] is a 76 y.o.-year-old female, returning for f/u for DM2, dx in 2013, and as LADA in 04/2016, insulin-dependent since 2014, uncontrolled, with complications (DKA, ? Related PN). Last visit 3 mo ago.  She was admitted with sepsis 2/2 pyelonephritis 02/2017. Saw Dr Jeffie Pollock yesterday >> Urinary tract inf >> UCx pending.  Reviewed hx: She has a complicated medical history, with a diagnosis of ulcerative colitis with significant diarrhea that caused dehydration in the past and episodes of DKA. She had 2 admissions for this in 02/26/2016 and 04/14/2016. While in rehabilitation, her CBGs dropped to 30s on her previous dose of insulin, therefore, her long-acting insulin was decreased.   She also has a history of lung cancer diagnosed 09/2013. She had chemotherapy and radiation therapy for this, now immunetx.   Last hemoglobin A1c was: Lab Results  Component Value Date   HGBA1C 7.9 02/15/2017   HGBA1C 8.7 11/11/2016   HGBA1C 8.3 08/12/2016  12/30/2015: 12% 09/18/2015: 11.3% 06/17/2015: 7.5% 04/08/2015: 10.9% 09/10/2014: 8.5%  11/04/2013: 6.2%  At last visit, we adjusted to: - Levemir to 15 units in am and 25 at bedtime - Humalog mealtime dose  - small meal: 12 units  - regular meal: 18 units  - larger meal: 24 units  Please do not skip the insulin if sugars are 70-100.   If the sugars are <70, take 1/2 of the Humalog dose.   Pt checks her sugars 3x a day >> brings a great log: - am: 89-269, 318, 533 >> 93-281, 313 >> 84-393 >> 90, 105-306 - 2h after b'fast: n/c >> 171-528 >> n/c - before lunch:  114, 122, 187-453 (snack before lunch) >> 159-464 >> 126-332 - 2h after lunch: n/c >> 134-439 >> n/c - before dinner: 99-600 >> 70, 96-447 >> 121-464 >> 98-576 >> 118, 146-449  - 2h after dinner: n/c >> 128-479 >> n/c  - bedtime: n/c >> 306-594 >> 145-383 >> 84, 166-573 >> 113-447 >> 63,  144-358 - nighttime: n/c >> 57 x1 No lows. Lowest sugar was 84 >> 63; she has hypoglycemia awareness at 50-60.  Highest sugar was 533 >> 573 >> 464 >> 449.  Pt's meals are: - Breakfast: Cereal or oatmeal with blueberries or eggs with sausage - Lunch: Sandwich and soup - Dinner: Meat, vegetables, small amount of starch - Snacks: 3-4 a day: Peanuts  - No h/o CKD, last BUN/creatinine:  Lab Results  Component Value Date   BUN 16.8 05/08/2017   BUN 7 03/25/2017   CREATININE 0.8 05/08/2017   CREATININE 0.76 03/25/2017   - last set of lipids: Lab Results  Component Value Date   CHOL 201 (H) 11/14/2016   HDL 57.40 11/14/2016   LDLCALC 128 (H) 11/14/2016   TRIG 79.0 11/14/2016   CHOLHDL 4 11/14/2016   - last eye exam was in 08/2016. No DR. Dr. Lady Gary. - she has numbness and tingling in her feet. She has PN from ChTx.  ROS: Constitutional: no weight gain/no weight loss, no fatigue, no subjective hyperthermia, no subjective hypothermia Eyes: no blurry vision, no xerophthalmia ENT: no sore throat, no nodules palpated in throat, no dysphagia, no odynophagia, no hoarseness Cardiovascular: no CP/no SOB/no palpitations/no leg swelling Respiratory: + cough/no SOB/no wheezing Gastrointestinal: no N/no V/no D/no C/no acid reflux Musculoskeletal: no muscle aches/no joint aches Skin: no rashes, no hair loss Neurological: no tremors/no numbness/no tingling/no dizziness  I reviewed pt's medications, allergies, PMH, social hx, family hx, and changes were documented in the history of present illness. Otherwise, unchanged from my initial visit note.   Past Medical History:  Diagnosis Date  . Cancer ([place]) dx'd 09/2013   Lung ca  . Diabetes ([place]) 09/08/2016   type I diabetic patient reported on 09/13/16   . Diabetes mellitus    insulin  . Diabetic neuropathy ([place]) 09/08/2016  . GERD (gastroesophageal reflux disease)    no meds for  . History of hiatal hernia   . Hx of radiation therapy  12/16/13-01/30/14   lung 66Gy  . Hypertension   . Malignant neoplasm of right upper lobe of lung ([place]) 11/28/2013  . Neuropathy   . Psoriasis   . Psoriatic arthritis ([place]) 09/08/2016  . Rheumatoid arthritis ([place])   . Shortness of breath dyspnea    with exertion   Past Surgical History:  Procedure Laterality Date  . ABDOMINAL HYSTERECTOMY    . ABDOMINAL SURGERY    . BALLOON DILATION N/A 12/12/2014   Procedure: BALLOON DILATION;  Surgeon: Inda Castle, MD;  Location: [facility];  Service: Endoscopy;  Laterality: N/A;  . CHOLECYSTECTOMY    . COLONOSCOPY N/A 02/07/2016   Procedure: COLONOSCOPY;  Surgeon: Doran Stabler, MD;  Location: [facility] END[facility]COPY;  Service: Endoscopy;  Laterality: N/A;  . DILATION AND CURETTAGE OF UTERUS    . ESOPHAGOGASTRODUODENOSCOPY N/A 12/12/2014   Procedure: ESOPHAGOGASTRODUODENOSCOPY (EGD);  Surgeon: Inda Castle, MD;  Location: [facility];  Service: Endoscopy;  Laterality: N/A;  with dilation  . ESOPHAGOGASTRODUODENOSCOPY (EGD) WITH PROPOFOL N/A 10/09/2014   Procedure: ESOPHAGOGASTRODUODENOSCOPY (EGD) WITH PROPOFOL;  Surgeon: Inda Castle, MD;  Location: [place];  Service: Endoscopy;  Laterality: N/A;  . ESOPHAGOGASTRODUODENOSCOPY (EGD) WITH PROPOFOL N/A 09/19/2016   Procedure: ESOPHAGOGASTRODUODENOSCOPY (EGD) WITH PROPOFOL;  Surgeon: Doran Stabler, MD;  Location: [facility];  Service: Gastroenterology;  Laterality: N/A;  with savary dil tt  . FRACTURE SURGERY    . ORIF ANKLE FRACTURE Right 07/04/2015   Procedure: OPEN REDUCTION INTERNAL FIXATION (ORIF) ANKLE FRACTURE;  Surgeon: Newt Minion, MD;  Location: [place];  Service: Orthopedics;  Laterality: Right;  OPEN REDUCTION, INTERNAL FIXATION OF RIGHT ANKLE FRACTURE.   [name] ORIF ANKLE FRACTURE Left 02/10/2016   Procedure: OPEN REDUCTION INTERNAL FIXATION (ORIF) ANKLE FRACTURE;  Surgeon: Newt Minion, MD;  Location: [place];  Service: Orthopedics;  Laterality: Left;  . SAVORY DILATION N/A 10/09/2014    Procedure: SAVORY DILATION;  Surgeon: Inda Castle, MD;  Location: [place];  Service: Endoscopy;  Laterality: N/A;  . SAVORY DILATION N/A 09/19/2016   Procedure: SAVORY DILATION;  Surgeon: Doran Stabler, MD;  Location: [facility];  Service: Gastroenterology;  Laterality: N/A;  . TONSILLECTOMY     Social History   Social History  . Marital Status: Divorced    Spouse Name: N/A  . Number of Children: 0   Occupational History  . Retired Pharmacist, hospital Other   Social History Main Topics  . Smoking status: Former Smoker -- 3.00 packs/day for 45 years    Types: Cigarettes    Quit date: 12/05/1997  . Smokeless tobacco: Never Used  . Alcohol Use: No  . Drug Use: No   Social History Narrative   Patient lives at home alone.   Caffeine Use: 2 cups daily   Current Outpatient Prescriptions on File Prior to Visit  Medication Sig Dispense Refill  . amLODipine (NORVASC) 2.5 MG tablet  Take 1 tablet (2.5 mg total) by mouth daily. 90 tablet 3  . augmented betamethasone dipropionate (DIPROLENE-AF) 0.05 % ointment APP AA BID PRN  1  . B-D UF III MINI PEN NEEDLES 31G X 5 MM MISC USE TO CHECK SUGAR FIVE TIMES DAILY 100 each 0  . DULoxetine (CYMBALTA) 60 MG capsule Take 1 capsule (60 mg total) by mouth 2 (two) times daily. 60 capsule 12  . HUMALOG KWIKPEN 100 UNIT/ML KiwkPen ADMINISTER 8 TO 20 UNITS UNDER THE SKIN THREE TIMES DAILY 30 mL 0  . Insulin Detemir (LEVEMIR FLEXTOUCH) 100 UNIT/ML Pen Inject 15 units in the morning, and 25 units in the pm. 15 pen 1  . LYRICA 150 MG capsule TAKE 1 CAPSULE BY MOUTH TWICE DAILY 60 capsule 5  . sulfaSALAzine (AZULFIDINE) 500 MG EC tablet Take 2 tablets (1,000 mg total) by mouth 2 (two) times daily. 120 tablet 2   No current facility-administered medications on file prior to visit.    Allergies  Allergen Reactions  . Carboplatin Other (See Comments)    Neuropathy  . Remicade [Infliximab] Anaphylaxis  . Ciprofloxacin Rash  . Hydromorphone Rash    Family History  Problem Relation Age of Onset  . Other Mother   . Other Father        failure to thrive  . Hypertension Other   . Stroke Other   . Heart attack Other   . Hemachromatosis Other   . Rheum arthritis Unknown        both sets of grandparents and father   PE: BP 128/82 (BP Location: Left Arm, Patient Position: Sitting)   Pulse (!) 106   Wt 184 lb (83.5 kg)   SpO2 94%   BMI 30.15 kg/m   Wt Readings from Last 3 Encounters:  05/18/17 184 lb (83.5 kg)  05/10/17 182 lb 12.8 oz (82.9 kg)  03/29/17 172 lb (78 kg)   Constitutional: overweight, in NAD Eyes: PERRLA, EOMI, no exophthalmos ENT: moist mucous membranes, no thyromegaly, no cervical lymphadenopathy Cardiovascular: tachycardia, RR, No MRG Respiratory: CTA B Gastrointestinal: abdomen soft, NT, ND, BS+ Musculoskeletal: no deformities, strength intact in all 4 Skin: moist, warm, no rashes Neurological: no tremor with outstretched hands, DTR normal in all 4   ASSESSMENT: 1. LADA, insulin-dependent, uncontrolled, with complications - DKA in the setting of dehydration (- PN) - this may be 2/2 ChTx.  Pulmonologist: Dr. Chase Caller Oncologist: Dr. Earlie Server Cardiologist: Dr. Sallyanne Kuster Ophthalmologist: Dr. Clifton James  PLAN:  1. Patient with long-standing, uncontrolled LADA, on basal-bolus insulin regimen with slightly better control at this visit, but still with very fluctuating sugars. She is doing a better job taking her insulin with meals. We also increased her insulin doses at last visit >> will need to increase them a little more as her sugars are still above target. - I suggested to:  Patient Instructions  Please change the regimen: - Levemir to 20 units in am and 25 at bedtime - Humalog mealtime dose  - small meal: 16 units  - regular meal: 20 units  - larger meal: 26 units  Please do not skip the insulin if sugars are 70-100.   If the sugars are <70, take 1/2 of the Humalog dose.  Please return in 3  months with your sugar log.   - today, HbA1c is 7.3% (best in a long while) - continue checking sugars at different times of the day - check 3x a day, rotating checks - advised for yearly eye exams >>  she is UTD - reviewed her hospitalization records >> No DKA - Return to clinic in 3 mo with sugar log     Philemon Kingdom, MD PhD [facility] Endocrinology

## 2017-05-28 ENCOUNTER — Other Ambulatory Visit: Payer: Self-pay | Admitting: Internal Medicine

## 2017-06-08 ENCOUNTER — Other Ambulatory Visit: Payer: Self-pay | Admitting: Internal Medicine

## 2017-06-29 ENCOUNTER — Other Ambulatory Visit: Payer: Self-pay | Admitting: Internal Medicine

## 2017-07-07 ENCOUNTER — Other Ambulatory Visit: Payer: Self-pay | Admitting: Rheumatology

## 2017-07-10 NOTE — Telephone Encounter (Signed)
Last Visit: 03/08/17 Next Visit: 09/08/17 Labs: 05/08/17 Alkaline Phos. 161 previous 154 and prior to that 203 (labs performed by Oncology)  Okay to refill SSZ?

## 2017-07-10 NOTE — Telephone Encounter (Signed)
ok 

## 2017-07-13 ENCOUNTER — Other Ambulatory Visit: Payer: Self-pay | Admitting: *Deleted

## 2017-07-16 ENCOUNTER — Other Ambulatory Visit: Payer: Self-pay | Admitting: Internal Medicine

## 2017-07-24 ENCOUNTER — Other Ambulatory Visit: Payer: Self-pay | Admitting: Rheumatology

## 2017-07-24 NOTE — Telephone Encounter (Signed)
Last Visit: 03/08/17 Next Visit: 09/08/17 Labs: 05/08/17 Alkaline Phos. 161 previous 154 and prior to that 203 (labs performed by Oncology)  Okay to refill per Dr. Estanislado Pandy

## 2017-07-29 ENCOUNTER — Other Ambulatory Visit: Payer: Self-pay | Admitting: Internal Medicine

## 2017-08-02 ENCOUNTER — Other Ambulatory Visit: Payer: Self-pay | Admitting: Internal Medicine

## 2017-08-17 ENCOUNTER — Encounter: Payer: Self-pay | Admitting: Internal Medicine

## 2017-08-17 ENCOUNTER — Ambulatory Visit (INDEPENDENT_AMBULATORY_CARE_PROVIDER_SITE_OTHER): Payer: Medicare Other | Admitting: Internal Medicine

## 2017-08-17 VITALS — BP 130/82 | HR 104 | Wt 193.0 lb

## 2017-08-17 DIAGNOSIS — E139 Other specified diabetes mellitus without complications: Secondary | ICD-10-CM

## 2017-08-17 DIAGNOSIS — E109 Type 1 diabetes mellitus without complications: Secondary | ICD-10-CM

## 2017-08-17 LAB — POCT GLYCOSYLATED HEMOGLOBIN (HGB A1C): HEMOGLOBIN A1C: 6.8

## 2017-08-17 MED ORDER — INSULIN LISPRO 100 UNIT/ML (KWIKPEN): PEN_INJECTOR | SUBCUTANEOUS | 3 refills | Status: DC

## 2017-08-17 MED ORDER — INSULIN DETEMIR 100 UNIT/ML FLEXPEN: PEN_INJECTOR | SUBCUTANEOUS | 3 refills | Status: DC

## 2017-08-17 MED ORDER — GLUCOSE BLOOD VI STRP: ORAL_STRIP | 5 refills | Status: DC

## 2017-08-17 MED ORDER — INSULIN PEN NEEDLE 31G X 5 MM MISC: 5 refills | Status: DC

## 2017-08-17 MED ORDER — ONETOUCH DELICA LANCETS FINE MISC: 5 refills | Status: DC

## 2017-08-17 NOTE — Progress Notes (Signed)
Patient ID: Megan [name], female   DOB: 1941-10-22, 76 y.o.   MRN: [id number]  HPI: Megan [name] is a 76 y.o.-year-old female, returning for f/u for DM2, dx in 2013, and as LADA in 04/2016, insulin-dependent since 2014, uncontrolled, with complications (DKA, ? Related PN). Last visit 3 mo ago.  She had EMT come to her home yesterday after she fell >> CBG 57. Today a CBG was 49.   Reviewed hx: She has a complicated medical history, with a diagnosis of ulcerative colitis with significant diarrhea that caused dehydration in the past and episodes of DKA. She had 2 admissions for this in 02/26/2016 and 04/14/2016. While in rehabilitation, her CBGs dropped to 30s on her previous dose of insulin, therefore, her long-acting insulin was decreased.   She also has a history of lung cancer diagnosed 09/2013. She had chemotherapy and radiation therapy for this, now on immune tx.  Last hemoglobin A1c was: Lab Results  Component Value Date   HGBA1C 7.3 05/18/2017   HGBA1C 7.9 02/15/2017   HGBA1C 8.7 11/11/2016  12/30/2015: 12% 09/18/2015: 11.3% 06/17/2015: 7.5% 04/08/2015: 10.9% 09/10/2014: 8.5%  11/04/2013: 6.2%  At last visit, we adjusted to: - Levemir 20 >> 15 units in am and 25 at bedtime - Humalog mealtime dose  - small meal: 16 units  - regular meal: 20 units  -also takes this at bedtime  - larger meal: 26 units  Please do not skip the insulin if sugars are 70-100.   If the sugars are <70, take 1/2 of the Humalog dose.  She had a SSI before, but was not using it.  Pt checks her sugars 3x a day >> brings a great log - extremely fluctuating sugars: - am:  93-281, 313 >> 84-393 >> 90, 105-306 >> 68-323, 419, 536 - 2h after b'fast: n/c >> 171-528 >> n/c >> 59 - before lunch:   159-464 >> 126-332 >> 67-316, 450 - 2h after lunch: n/c >> 134-439 >> n/c >> 47 - before dinner:  98-576 >> 118, 146-449 >> 85-364 - 2h after dinner: n/c >> 128-479 >> n/c  - bedtime: 113-447 >> 63,  144-358 >> 88-336, 431, 510 - nighttime: n/c >> 57 x1 Lowest sugar was 84 >> 63 >> 47; she has hypoglycemia awareness at 50s.  Highest sugar was 533 >> 573 >> 464 >> 449 >> 500s.  Pt's meals are: - Breakfast: Cereal or oatmeal with blueberries or eggs with sausage - Lunch: Sandwich and soup - Dinner: Meat, vegetables, small amount of starch - Snacks: 3-4 a day: Peanuts  - NO CKD, last BUN/creatinine:  Lab Results  Component Value Date   BUN 16.8 05/08/2017   BUN 7 03/25/2017   CREATININE 0.8 05/08/2017   CREATININE 0.76 03/25/2017   - + HL; last set of lipids: Lab Results  Component Value Date   CHOL 201 (H) 11/14/2016   HDL 57.40 11/14/2016   LDLCALC 128 (H) 11/14/2016   TRIG 79.0 11/14/2016   CHOLHDL 4 11/14/2016   - last eye exam was in 08/2016 >> No DR. Dr. Lady Gary. - + numbness and tingling in her feet. She has PN from ChTx.  ROS: Constitutional: + weight gain/no weight loss, + fatigue,+ hot flushes, no subjective hypothermia, + nocturia Eyes: + blurry vision, no xerophthalmia ENT: + sore throat, no nodules palpated in throat, no dysphagia, no odynophagia, + hoarseness Cardiovascular: no CP/no SOB/no palpitations/+ leg swelling Respiratory: + cough/+ SOB/+ wheezing Gastrointestinal: no N/no V/no D/no C/+ acid  reflux Musculoskeletal: no muscle aches/no joint aches Skin: no rashes, no hair loss Neurological: no tremors/+ numbness/+ tingling/no dizziness  I reviewed pt's medications, allergies, PMH, social hx, family hx, and changes were documented in the history of present illness. Otherwise, unchanged from my initial visit note.  Past Medical History:  Diagnosis Date  . Cancer ([place]) dx'd 09/2013   Lung ca  . Diabetes ([place]) 09/08/2016   type I diabetic patient reported on 09/13/16   . Diabetes mellitus    insulin  . Diabetic neuropathy ([place]) 09/08/2016  . GERD (gastroesophageal reflux disease)    no meds for  . History of hiatal hernia   . Hx of radiation  therapy 12/16/13-01/30/14   lung 66Gy  . Hypertension   . Malignant neoplasm of right upper lobe of lung ([place]) 11/28/2013  . Neuropathy   . Psoriasis   . Psoriatic arthritis ([place]) 09/08/2016  . Rheumatoid arthritis ([place])   . Shortness of breath dyspnea    with exertion   Past Surgical History:  Procedure Laterality Date  . ABDOMINAL HYSTERECTOMY    . ABDOMINAL SURGERY    . BALLOON DILATION N/A 12/12/2014   Procedure: BALLOON DILATION;  Surgeon: Inda Castle, MD;  Location: [facility];  Service: Endoscopy;  Laterality: N/A;  . CHOLECYSTECTOMY    . COLONOSCOPY N/A 02/07/2016   Procedure: COLONOSCOPY;  Surgeon: Doran Stabler, MD;  Location: [facility] ENDOSCOPY;  Service: Endoscopy;  Laterality: N/A;  . DILATION AND CURETTAGE OF UTERUS    . ESOPHAGOGASTRODUODENOSCOPY N/A 12/12/2014   Procedure: ESOPHAGOGASTRODUODENOSCOPY (EGD);  Surgeon: Inda Castle, MD;  Location: [facility];  Service: Endoscopy;  Laterality: N/A;  with dilation  . ESOPHAGOGASTRODUODENOSCOPY (EGD) WITH PROPOFOL N/A 10/09/2014   Procedure: ESOPHAGOGASTRODUODENOSCOPY (EGD) WITH PROPOFOL;  Surgeon: Inda Castle, MD;  Location: [place];  Service: Endoscopy;  Laterality: N/A;  . ESOPHAGOGASTRODUODENOSCOPY (EGD) WITH PROPOFOL N/A 09/19/2016   Procedure: ESOPHAGOGASTRODUODENOSCOPY (EGD) WITH PROPOFOL;  Surgeon: Doran Stabler, MD;  Location: [facility] ENDOSCOPY;  Service: Gastroenterology;  Laterality: N/A;  with savary dil tt  . FRACTURE SURGERY    . ORIF ANKLE FRACTURE Right 07/04/2015   Procedure: OPEN REDUCTION INTERNAL FIXATION (ORIF) ANKLE FRACTURE;  Surgeon: Newt Minion, MD;  Location: [place];  Service: Orthopedics;  Laterality: Right;  OPEN REDUCTION, INTERNAL FIXATION OF RIGHT ANKLE FRACTURE.   [name] ORIF ANKLE FRACTURE Left 02/10/2016   Procedure: OPEN REDUCTION INTERNAL FIXATION (ORIF) ANKLE FRACTURE;  Surgeon: Newt Minion, MD;  Location: [place];  Service: Orthopedics;  Laterality: Left;  . SAVORY DILATION N/A  10/09/2014   Procedure: SAVORY DILATION;  Surgeon: Inda Castle, MD;  Location: [place];  Service: Endoscopy;  Laterality: N/A;  . SAVORY DILATION N/A 09/19/2016   Procedure: SAVORY DILATION;  Surgeon: Doran Stabler, MD;  Location: [facility] ENDOSCOPY;  Service: Gastroenterology;  Laterality: N/A;  . TONSILLECTOMY     Social History   Social History  . Marital Status: Divorced    Spouse Name: N/A  . Number of Children: 0   Occupational History  . Retired Pharmacist, hospital Other   Social History Main Topics  . Smoking status: Former Smoker -- 3.00 packs/day for 45 years    Types: Cigarettes    Quit date: 12/05/1997  . Smokeless tobacco: Never Used  . Alcohol Use: No  . Drug Use: No   Social History Narrative   Patient lives at home alone.   Caffeine Use: 2 cups daily   Current  Outpatient Prescriptions on File Prior to Visit  Medication Sig Dispense Refill  . amLODipine (NORVASC) 2.5 MG tablet Take 1 tablet (2.5 mg total) by mouth daily. 90 tablet 3  . augmented betamethasone dipropionate (DIPROLENE-AF) 0.05 % ointment APP AA BID PRN  1  . B-D UF III MINI PEN NEEDLES 31G X 5 MM MISC USE TO CHECK SUGAR FIVE TIMES DAILY 100 each 0  . DULoxetine (CYMBALTA) 60 MG capsule Take 1 capsule (60 mg total) by mouth 2 (two) times daily. 60 capsule 12  . HUMALOG KWIKPEN 100 UNIT/ML KiwkPen ADMINISTER 15 TO 26 UNITS UNDER THE SKIN THREE TIMES DAILY 15 mL 0  . Insulin Detemir (LEVEMIR FLEXTOUCH) 100 UNIT/ML Pen Inject 20 units in the morning, and 25 units in the pm. 15 pen 3  . LEVEMIR FLEXTOUCH 100 UNIT/ML Pen INJECT 20 UNITS INTO THE SKIN AT BEDTIME 30 mL 0  . LYRICA 150 MG capsule TAKE 1 CAPSULE BY MOUTH TWICE DAILY 60 capsule 5  . ONETOUCH DELICA LANCETS FINE MISC USE TO CHECK BLOOD SUGAR FOUR TIMES DAILY AS NEEDED 200 each 0  . sulfaSALAzine (AZULFIDINE) 500 MG EC tablet TAKE 2 TABLETS(1000 MG) BY MOUTH TWICE DAILY 120 tablet 0   No current facility-administered medications on file prior to  visit.    Allergies  Allergen Reactions  . Carboplatin Other (See Comments)    Neuropathy  . Remicade [Infliximab] Anaphylaxis  . Ciprofloxacin Rash  . Hydromorphone Rash   Family History  Problem Relation Age of Onset  . Other Mother   . Other Father        failure to thrive  . Hypertension Other   . Stroke Other   . Heart attack Other   . Hemachromatosis Other   . Rheum arthritis Unknown        both sets of grandparents and father   PE: BP 130/82 (BP Location: Left Arm, Patient Position: Sitting, Cuff Size: Normal)   Pulse (!) 104   Wt 193 lb (87.5 kg)   SpO2 93%   BMI 31.63 kg/m  Wt Readings from Last 3 Encounters:  08/17/17 193 lb (87.5 kg)  05/18/17 184 lb (83.5 kg)  05/10/17 182 lb 12.8 oz (82.9 kg)   Constitutional: overweight, in NAD Eyes: PERRLA, EOMI, no exophthalmos ENT: moist mucous membranes, no thyromegaly, no cervical lymphadenopathy Cardiovascular: tachycardia, RR, No MRG Respiratory: CTA B Gastrointestinal: abdomen soft, NT, ND, BS+ Musculoskeletal: no deformities, strength intact in all 4 Skin: moist, warm, no rashes Neurological: no tremor with outstretched hands, DTR normal in all 4  ASSESSMENT: 1. LADA, insulin-dependent, uncontrolled, with complications - DKA in the setting of dehydration (- PN) - this may be 2/2 ChTx.  Pulmonologist: Dr. Chase Caller Oncologist: Dr. Earlie Server Cardiologist: Dr. Sallyanne Kuster Ophthalmologist: Dr. Clifton James  PLAN:  1. Patient with long-standing, poorly controlled LADA, on basal-bolus insulin regimen, with very fluctuating CBGs: 40s-500s. She does not know what may cause the sugars to go in one of the other direction. She brings a good log, but does not enter comments about the sugars >> advised to do so. She also documents the insulin doses in her log >> I noticed she is taking a full mealtime dose of Humalog at bedtime, too, along with Levemir >> will discourage her from using this >> will only use a Correction (and  even then, a lower dose) if sugars >300. Will also ask her to hold mealtime insulin if sugars are <60. - OTW, the sugars are so variable and  mostly high >> cannot change much more in her regimen now - we also discussed about the Libre CGM >> given info about suppliers >> she agrees to try - I suggested to:  Patient Instructions  Please continue: - Levemir 15 units in am and 25 at bedtime - Humalog mealtime dose  - small meal: 16 units  - regular meal: 20 units  - larger meal: 26 units  Please do not skip the insulin if sugars are 70-100.   If the sugars are 60-70, take 1/2 of the Humalog dose. If the sugars are <60, skip insulin with that meal  Stop Humalog at bedtime, unless sugars >300: take 5 units or >400: 8 units   Please return in 3 months with your sugar log.   - today, HbA1c is 6.8% (lower) - continue checking sugars at different times of the day - check 3x-4x a day, rotating checks - advised for yearly eye exams >> she is UTD - she will have the flu shot soon - Return to clinic in 3 mo with sugar log    - time spent with the patient: 25 min, of which >50% was spent in reviewing her detailed sugar log (she keeps great records of her sugars), discussing her hypo- and hyper-glycemic episodes, reviewing  previous labs and insulin doses and developing a plan to avoid hypo- and hyper-glycemia.    Philemon Kingdom, MD PhD [facility]

## 2017-08-17 NOTE — Patient Instructions (Addendum)
Please continue: - Levemir 15 units in am and 25 at bedtime - Humalog mealtime dose  - small meal: 16 units  - regular meal: 20 units  - larger meal: 26 units  Please do not skip the insulin if sugars are 70-100.   If the sugars are 60-70, take 1/2 of the Humalog dose. If the sugars are <60, skip insulin with that meal  Stop Humalog at bedtime, unless sugars >300: take 5 units or >400: 8 units   Please return in 3 months with your sugar log.

## 2017-08-20 ENCOUNTER — Other Ambulatory Visit: Payer: Self-pay | Admitting: Internal Medicine

## 2017-08-22 ENCOUNTER — Ambulatory Visit (INDEPENDENT_AMBULATORY_CARE_PROVIDER_SITE_OTHER): Payer: Medicare Other | Admitting: Emergency Medicine

## 2017-08-22 ENCOUNTER — Encounter: Payer: Self-pay | Admitting: Emergency Medicine

## 2017-08-22 DIAGNOSIS — R05 Cough: Secondary | ICD-10-CM | POA: Diagnosis not present

## 2017-08-22 DIAGNOSIS — R059 Cough, unspecified: Secondary | ICD-10-CM | POA: Insufficient documentation

## 2017-08-22 MED ORDER — LORATADINE 10 MG PO TABS: 10.0000 mg | ORAL_TABLET | Freq: Every day | ORAL | 3 refills | Status: DC

## 2017-08-22 MED ORDER — OMEPRAZOLE 20 MG PO CPDR: 20.0000 mg | DELAYED_RELEASE_CAPSULE | Freq: Every day | ORAL | 11 refills | Status: DC

## 2017-08-22 MED ORDER — GUAIFENESIN ER 600 MG PO TB12: 600.0000 mg | ORAL_TABLET | Freq: Two times a day (BID) | ORAL | 3 refills | Status: DC

## 2017-08-22 NOTE — Patient Instructions (Signed)
Please start using Mucinex 600 mg once or twice a day to help with your chest congestion Please start loratadine 10 mg once a day until your next visit here Stop your current stomach acid medication. Start omeprazole 20 mg twice a day.  Take this medication either 1 hour before or 1 hour after eating Get your CT scan as already planned by Dr. Julien Nordmann. Follow with Dr. Chase Caller in 2-3 weeks or next available after that to review your scan, discuss your cough.  He may decide to restart you on every day inhaled medication at that time.

## 2017-08-22 NOTE — Assessment & Plan Note (Signed)
This is acute on chronic worsening of cough in a patient with a history of clinical asthma, suspected mild obstruction on pulmonary function testing, squamous cell lung cancer currently on observation.  Clearly the differential here is broad but her description of her symptoms sounds upper airway in nature.  She may have gotten some relief when she started a "stomach acid medicine" but she cannot tell me which one.  Her memory and history giving is poor-she cannot remember being on Symbicort as instructed by Dr. Chase Caller last visit.  I believe it would be reasonable to treat her for rhinitis, GERD empirically.  Have her start Mucinex to help with her perceived chest congestion that she cannot clear.  She is scheduled for a CT chest next week.  She can follow-up with Dr. Chase Caller to determine whether the upper airway targeted therapies have been beneficial, review the CT scan and ensure no evidence for progression of her malignancy as a cause for her cough.  Finally I suspect that she may benefit from being back on bronchodilators.  Need to evaluate this once we see how she responds to GERD and rhinitis therapy.    Please start using Mucinex 600 mg once or twice a day to help with your chest congestion Please start loratadine 10 mg once a day until your next visit here Stop your current stomach acid medication. Start omeprazole 20 mg twice a day.  Take this medication either 1 hour before or 1 hour after eating Get your CT scan as already planned by Dr. Julien Nordmann. Follow with Dr. Chase Caller in 2-3 weeks or next available after that to review your scan, discuss your cough.  He may decide to restart you on every day inhaled medication at that time.

## 2017-08-22 NOTE — Progress Notes (Signed)
Subjective:    Patient ID: Megan [name], female    DOB: Jun 26, 1941, 76 y.o.   MRN: [id number]  HPI 76 year old former smoker (135 pack years), followed in our office for COPD, history of stage IIIb squamous cell lung cancer of the right lung treated with chemoradiation, currently on observation and following with Dr. Julien Nordmann.  She also has a history of diabetes, GERD, hiatal hernia, hypertension, rheumatoid arthritis not currently on immunosuppression.  Her Most recent CT chest 05/17/17 was reviewed by me.  This shows some radiation change in the paramediastinal right lung, chronic right upper lobe atelectasis, a new small right pleural effusion.  She is here today for an acute visit for evaluation of persistent cough. Seemed to worsen about 8 months ago, but has been present for 3+ years. Associated with some voice hoarseness. She has has some nasal congestion and gtt. She was formerly on Symbicort per Dr Chase Caller > she cannot remember anything about this!!  PFT from 2015 with ? Mild obstruction. She has some GERD sx, usually at night, worse when she is supine.   She is scheduled for a repeat CT chest next week.   Review of Systems   Past Medical History:  Diagnosis Date  . Cancer ([place]) dx'd 09/2013   Lung ca  . Diabetes ([place]) 09/08/2016   type I diabetic patient reported on 09/13/16   . Diabetes mellitus    insulin  . Diabetic neuropathy ([place]) 09/08/2016  . GERD (gastroesophageal reflux disease)    no meds for  . History of hiatal hernia   . Hx of radiation therapy 12/16/13-01/30/14   lung 66Gy  . Hypertension   . Malignant neoplasm of right upper lobe of lung ([place]) 11/28/2013  . Neuropathy   . Psoriasis   . Psoriatic arthritis ([place]) 09/08/2016  . Rheumatoid arthritis ([place])   . Shortness of breath dyspnea    with exertion     Family History  Problem Relation Age of Onset  . Other Mother   . Other Father        failure to thrive  . Hypertension Other   . Stroke Other     . Heart attack Other   . Hemachromatosis Other   . Rheum arthritis Unknown        both sets of grandparents and father     Social History   Social History  . Marital status: Divorced    Spouse name: N/A  . Number of children: 0  . Years of education: MA   Occupational History  . Retired Other   Social History Main Topics  . Smoking status: Former Smoker    Packs/day: 3.00    Years: 45.00    Types: Cigarettes    Quit date: 12/05/1997  . Smokeless tobacco: Never Used  . Alcohol use No  . Drug use: No  . Sexual activity: Not Currently   Other Topics Concern  . Not on file   Social History Narrative   Patient lives at home alone.   Caffeine Use: 2 cups daily     Allergies  Allergen Reactions  . Carboplatin Other (See Comments)    Neuropathy  . Remicade [Infliximab] Anaphylaxis  . Ciprofloxacin Rash  . Hydromorphone Rash     Outpatient Medications Prior to Visit  Medication Sig Dispense Refill  . amLODipine (NORVASC) 2.5 MG tablet Take 1 tablet (2.5 mg total) by mouth daily. 90 tablet 3  . augmented betamethasone dipropionate (DIPROLENE-AF) 0.05 % ointment APP AA  BID PRN  1  . B-D UF III MINI PEN NEEDLES 31G X 5 MM MISC USE TO CHECK SUGAR FIVE TIMES DAILY 100 each 0  . DULoxetine (CYMBALTA) 60 MG capsule Take 1 capsule (60 mg total) by mouth 2 (two) times daily. 60 capsule 12  . glucose blood test strip Use as instructed 4x a day - Verio 200 each 5  . Insulin Detemir (LEVEMIR FLEXTOUCH) 100 UNIT/ML Pen Inject 20 units in the morning, and 25 units in the pm. 15 pen 3  . insulin lispro (HUMALOG KWIKPEN) 100 UNIT/ML KiwkPen ADMINISTER 15 TO 26 UNITS UNDER THE SKIN THREE TIMES DAILY 15 pen 3  . Insulin Pen Needle (B-D UF III MINI PEN NEEDLES) 31G X 5 MM MISC USE TO take insulin FIVE TIMES DAILY 200 each 5  . LYRICA 150 MG capsule TAKE 1 CAPSULE BY MOUTH TWICE DAILY 60 capsule 5  . ONETOUCH DELICA LANCETS FINE MISC USE TO CHECK BLOOD SUGAR FOUR TIMES DAILY 200 each 5  .  sulfaSALAzine (AZULFIDINE) 500 MG EC tablet TAKE 2 TABLETS(1000 MG) BY MOUTH TWICE DAILY 120 tablet 0   No facility-administered medications prior to visit.          Objective:   Physical Exam  Vitals:   08/22/17 1146  BP: (!) 140/98  Pulse: (!) 106  SpO2: 97%  Weight: 189 lb (85.7 kg)  Height: 5\' 6"  (1.676 m)   Gen: Pleasant, elderly woman, in no distress,  normal affect  ENT: No lesions,  mouth clear,  oropharynx clear, no postnasal drip  Neck: No JVD, no stridor  Lungs: No use of accessory muscles, clear without rales or rhonchi  Cardiovascular: RRR, heart sounds normal, no murmur or gallops, no peripheral edema  Musculoskeletal: No deformities, no cyanosis or clubbing  Neuro: Awake, interacting, somewhat confused and unable to give history.  No apparent focal deficits  Skin: Warm, no lesions or rashes     Assessment & Plan:  Cough This is acute on chronic worsening of cough in a patient with a history of clinical asthma, suspected mild obstruction on pulmonary function testing, squamous cell lung cancer currently on observation.  Clearly the differential here is broad but her description of her symptoms sounds upper airway in nature.  She may have gotten some relief when she started a "stomach acid medicine" but she cannot tell me which one.  Her memory and history giving is poor-she cannot remember being on Symbicort as instructed by Dr. Chase Caller last visit.  I believe it would be reasonable to treat her for rhinitis, GERD empirically.  Have her start Mucinex to help with her perceived chest congestion that she cannot clear.  She is scheduled for a CT chest next week.  She can follow-up with Dr. Chase Caller to determine whether the upper airway targeted therapies have been beneficial, review the CT scan and ensure no evidence for progression of her malignancy as a cause for her cough.  Finally I suspect that she may benefit from being back on bronchodilators.  Need to  evaluate this once we see how she responds to GERD and rhinitis therapy.    Please start using Mucinex 600 mg once or twice a day to help with your chest congestion Please start loratadine 10 mg once a day until your next visit here Stop your current stomach acid medication. Start omeprazole 20 mg twice a day.  Take this medication either 1 hour before or 1 hour after eating Get your CT scan  as already planned by Dr. Julien Nordmann. Follow with Dr. Chase Caller in 2-3 weeks or next available after that to review your scan, discuss your cough.  He may decide to restart you on every day inhaled medication at that time.  Baltazar Apo, MD, PhD 08/22/2017, 12:36 PM [facility] [phone] or if no answer [phone]

## 2017-08-28 NOTE — Progress Notes (Signed)
Office Visit Note  Patient: Megan [name]             Date of Birth: 26-Mar-1941           MRN: [id number]             PCP: Binnie Rail, MD Referring: Binnie Rail, MD Visit Date: 09/08/2017 Occupation: @GUAROCC @    Subjective:  Arthritis (BIL hand, foot pain )   History of Present Illness: Megan [name] is a 76 y.o. female with history of psoriatic arthritis and psoriasis. She states she's been having intermittent swelling in her bilateral hands for the last 6 months. She's been tolerating sulfasalazine well. She's been having some discomfort in her feet as well. She states she has had foot surgery in the past.  Activities of Daily Living:  Patient reports morning stiffness for 0 minute.   Patient Reports nocturnal pain.  Difficulty dressing/grooming: Denies Difficulty climbing stairs: Denies Difficulty getting out of chair: Denies Difficulty using hands for taps, buttons, cutlery, and/or writing: Denies   Review of Systems  Constitutional: Positive for fatigue. Negative for night sweats, weight gain, weight loss and weakness.  HENT: Positive for mouth dryness. Negative for mouth sores, trouble swallowing, trouble swallowing and nose dryness.   Eyes: Positive for dryness. Negative for pain, redness and visual disturbance.  Respiratory: Negative for cough, shortness of breath and difficulty breathing.   Cardiovascular: Negative for chest pain, palpitations, hypertension, irregular heartbeat and swelling in legs/feet.  Gastrointestinal: Negative for blood in stool, constipation and diarrhea.  Endocrine: Negative for increased urination.  Genitourinary: Negative for vaginal dryness.  Musculoskeletal: Positive for arthralgias, joint pain and morning stiffness. Negative for joint swelling, myalgias, muscle weakness, muscle tenderness and myalgias.  Skin: Positive for rash. Negative for color change, hair loss, skin tightness, ulcers and sensitivity to sunlight.    Allergic/Immunologic: Negative for susceptible to infections.  Neurological: Negative for dizziness, memory loss and night sweats.  Hematological: Negative for swollen glands.  Psychiatric/Behavioral: Negative for depressed mood and sleep disturbance. The patient is not nervous/anxious.     PMFS History:  Patient Active Problem List   Diagnosis Date Noted  . Cough 08/22/2017  . Altered mental status 03/22/2017  . Acute pyelonephritis 03/22/2017  . Hyponatremia 03/22/2017  . Hypokalemia 03/22/2017  . Primary osteoarthritis of both hands 03/08/2017  . High risk medication use 03/07/2017  . Facial laceration 02/07/2017  . Poor balance 02/07/2017  . Type 2 diabetes mellitus with diabetic polyneuropathy, with long-term current use of insulin ([place]) 01/11/2017  . Subarachnoid bleed ([place]) 01/11/2017  . Painful orthopaedic hardware ([place]) 01/05/2017  . Psoriatic arthropathy ([place]) 09/08/2016  . Essential hypertension 09/08/2016  . Diabetic neuropathy ([place]) 09/08/2016  . LADA (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), managed as type 1 ([place]) 05/04/2016  . Syncope 03/25/2016  . Malnutrition of moderate degree 02/29/2016  . Leukocytosis 02/17/2016  . Diabetic ketoacidosis with coma associated with diabetes mellitus due to underlying condition ([place])   . Abnormal finding on GI tract imaging   . Diarrhea   . Hematochezia   . RLS (restless legs syndrome) 12/11/2015  . Liver dysfunction 08/11/2015  . Lumbago 05/06/2015  . Peripheral edema 04/14/2015  . Psoriasis 04/14/2015  . Encounter for antineoplastic immunotherapy 04/06/2015  . Emphysema of lung ([place]) 11/26/2014  . Radiation-induced esophageal stricture 10/01/2014  . Tachycardia 08/27/2014  . Depression 08/27/2014  . Palliative care encounter 08/21/2014  . DNR (do not resuscitate) discussion 08/21/2014  . Neuropathic pain  08/21/2014  . Cancer associated pain 07/29/2014  . Malignant cachexia ([place]) 07/29/2014  . Lung collapse 07/29/2014   . Fatigue 07/18/2014  . Neuropathy due to chemotherapeutic drug ([place]) 07/18/2014  . Leg cramps 07/18/2014  . Hypoalbuminemia 07/18/2014  . Thrombocytopenia, unspecified ([place]) 07/18/2014  . Dyspnea 04/28/2014  . Radiation esophagitis 04/10/2014  . Right-sided chest wall pain 04/01/2014  . Cancer of middle lobe of lung ([place]) 01/17/2014  . Malignant neoplasm of right upper lobe of lung ([place]) 11/28/2013    Past Medical History:  Diagnosis Date  . Cancer ([place]) dx'd 09/2013   Lung ca  . Diabetes ([place]) 09/08/2016   type I diabetic patient reported on 09/13/16   . Diabetes mellitus    insulin  . Diabetic neuropathy ([place]) 09/08/2016  . GERD (gastroesophageal reflux disease)    no meds for  . History of hiatal hernia   . Hx of radiation therapy 12/16/13-01/30/14   lung 66Gy  . Hypertension   . Malignant neoplasm of right upper lobe of lung ([place]) 11/28/2013  . Neuropathy   . Psoriasis   . Psoriatic arthritis ([place]) 09/08/2016  . Rheumatoid arthritis ([place])   . Shortness of breath dyspnea    with exertion    Family History  Problem Relation Age of Onset  . Other Mother   . Other Father        failure to thrive  . Hypertension Other   . Stroke Other   . Heart attack Other   . Hemachromatosis Other   . Rheum arthritis Unknown        both sets of grandparents and father   Past Surgical History:  Procedure Laterality Date  . ABDOMINAL HYSTERECTOMY    . ABDOMINAL SURGERY    . CHOLECYSTECTOMY    . DILATION AND CURETTAGE OF UTERUS    . FRACTURE SURGERY    . TONSILLECTOMY     Social History   Social History Narrative   Patient lives at home alone.   Caffeine Use: 2 cups daily     Objective: Vital Signs: BP 126/79 (BP Location: Left Arm, Patient Position: Sitting, Cuff Size: Normal)   Pulse 94   Resp 20   Ht 5\' 6"  (1.676 m)   Wt 195 lb (88.5 kg)   BMI 31.47 kg/m    Physical Exam  Constitutional: She is oriented to person, place, and time. She appears well-developed and  well-nourished.  HENT:  Head: Normocephalic and atraumatic.  Eyes: Conjunctivae and EOM are normal.  Neck: Normal range of motion.  Cardiovascular: Normal rate, regular rhythm, normal heart sounds and intact distal pulses.  Pulmonary/Chest: Effort normal and breath sounds normal.  Abdominal: Soft. Bowel sounds are normal.  Lymphadenopathy:    She has no cervical adenopathy.  Neurological: She is alert and oriented to person, place, and time.  Skin: Skin is warm and dry. Capillary refill takes less than 2 seconds.  Psychiatric: She has a normal mood and affect. Her behavior is normal.  Nursing note and vitals reviewed.    Musculoskeletal Exam: C-spine and thoracic lumbar spine good range of motion. Shoulder joints elbow joints wrist joints good range of motion. She is no synovitis over MCPs PIPs or DIPs. Hip joints knee joints ankles MTPs PIPs with good range of motion. She has osteoarthritic changes in her feet due to osteoarthritis. She also has bilateral pes planus.  CDAI Exam: CDAI Homunculus Exam:   Tenderness:  Right foot: 1st MTP Left foot: 1st MTP  Joint Counts:  CDAI Tender Joint count: 0 CDAI Swollen Joint count: 0  Global Assessments:  Patient Global Assessment: 2 Provider Global Assessment: 2  CDAI Calculated Score: 4    Investigation: No additional findings. CBC Latest Ref Rng & Units 05/08/2017 03/25/2017 03/24/2017  WBC 3.9 - 10.3 10e3/uL 8.1 6.8 7.2  Hemoglobin 11.6 - 15.9 g/dL 13.2 10.7(L) 11.2(L)  Hematocrit 34.8 - 46.6 % 40.1 33.5(L) 35.3(L)  Platelets 145 - 400 10e3/uL 188 108(L) 115(L)   CMP Latest Ref Rng & Units 05/08/2017 03/25/2017 03/24/2017  Glucose 70 - 140 mg/dl 95 198(H) 143(H)  BUN 7.0 - 26.0 mg/dL 16.8 7 6   Creatinine 0.6 - 1.1 mg/dL 0.8 0.76 0.75  Sodium 136 - 145 mEq/L 144 134(L) 136  Potassium 3.5 - 5.1 mEq/L 3.6 3.5 3.5  Chloride 101 - 111 mmol/L - 103 105  CO2 22 - 29 mEq/L 28 24 24   Calcium 8.4 - 10.4 mg/dL 9.5 8.0(L) 7.9(L)  Total  Protein 6.4 - 8.3 g/dL 6.8 - -  Total Bilirubin 0.20 - 1.20 mg/dL 0.45 - -  Alkaline Phos 40 - 150 U/L 161(H) - -  AST 5 - 34 U/L 20 - -  ALT 0 - 55 U/L 22 - -    Imaging: No results found.  Speciality Comments: No specialty comments available.    Procedures:  No procedures performed Allergies: Carboplatin; Remicade [infliximab]; Ciprofloxacin; and Hydromorphone   Assessment / Plan:     Visit Diagnoses: Psoriatic arthropathy [facility]): Patient has no synovitis on examination. She's been tolerating sulfasalazine well.  Psoriasis: She has no active lesions. She is some nail dystrophy.  High risk medication use - Sulfasalazine 500 mg 2 tablets by mouth twice a day - Plan: CBC with Differential/Platelet, COMPLETE METABOLIC PANEL WITH GFR. Patient states that she will get labs with Dr. Worthy Flank office. I've advised to get labs every 3 months.  Primary osteoarthritis of both hands: Joint protection and muscle strengthening discussed.  Pes planus of both feet: She's been having feet discomfort due to having pes planus. I've given her prescription for orthotics.  Chronic midline low back pain without sciatica: Pain is tolerable.  Other medical problems are listed as follows:  History of lung cancer  RLS (restless legs syndrome)  Neuropathic pain  History of thrombocytopenia  Neuropathy due to chemotherapeutic drug (HCC)  History of hypertension  History of depression  History of subarachnoid hemorrhage  History of diabetes mellitus - latent autoimmune diabetes  History of esophagitis - (s/p Radiation)     Orders: Orders Placed This Encounter  Procedures  . CBC with Differential/Platelet  . COMPLETE METABOLIC PANEL WITH GFR   No orders of the defined types were placed in this encounter.   Face-to-face time spent with patient was 30 minutes. Greater than50% of time was spent in counseling and coordination of care.  Follow-Up Instructions: Return in about 5 months  (around 02/06/2018) for Psoriatic arthritis Ps.   Bo Merino, MD  Note - This record has been created using Editor, commissioning.  Chart creation errors have been sought, but may not always  have been located. Such creation errors do not reflect on  the standard of medical care.

## 2017-08-30 ENCOUNTER — Telehealth: Payer: Self-pay | Admitting: Internal Medicine

## 2017-08-30 ENCOUNTER — Other Ambulatory Visit: Payer: Self-pay

## 2017-08-30 MED ORDER — FREESTYLE LIBRE 14 DAY READER DEVI: 1.0000 | Freq: Every day | 0 refills | Status: DC

## 2017-08-30 MED ORDER — FREESTYLE LIBRE 14 DAY SENSOR MISC: 1.0000 | 2 refills | Status: DC

## 2017-08-30 NOTE — Telephone Encounter (Signed)
Submitted in the RX for the [place].

## 2017-08-30 NOTE — Telephone Encounter (Signed)
Patient contacted the [facility] [place]).They told her they would get back with her and never did (over 10 days) for the [facility]. She just spoke with [place] said Dr. Cruzita Lederer would have to put in an order for it. Please call patient to advise.

## 2017-09-05 ENCOUNTER — Encounter: Payer: Self-pay | Admitting: Internal Medicine

## 2017-09-05 ENCOUNTER — Ambulatory Visit: Payer: Medicare Other | Admitting: Internal Medicine

## 2017-09-05 VITALS — BP 126/80 | HR 103 | Ht 66.5 in | Wt 194.0 lb

## 2017-09-05 DIAGNOSIS — J439 Emphysema, unspecified: Secondary | ICD-10-CM

## 2017-09-05 DIAGNOSIS — R05 Cough: Secondary | ICD-10-CM | POA: Diagnosis not present

## 2017-09-05 DIAGNOSIS — R059 Cough, unspecified: Secondary | ICD-10-CM

## 2017-09-05 MED ORDER — BUDESONIDE-FORMOTEROL FUMARATE 80-4.5 MCG/ACT IN AERO: 2.0000 | INHALATION_SPRAY | Freq: Two times a day (BID) | RESPIRATORY_TRACT | 6 refills | Status: DC

## 2017-09-05 NOTE — Progress Notes (Signed)
Subjective:     Patient ID: Megan [name], female   DOB: 1941/03/31, 76 y.o.   MRN: [id number]  HPI     OV 09/05/2017  Chief Complaint  Patient presents with  . Follow-up    Pt states that she has been doing good except that she has had some chest congestion and a cough with mild SOB.    76 year old female stage IIIB lung cancer was survive the odds and has cancer in complete remission. Currently on expectant follow-up with Dr. Julien Nordmann. She has mild emphysema based on based onreduction in diffusion capacity in January 2015 and CT scan findings.Her course was   complicated by diabetes and neuropathy and depression and weight loss and fatigue all due to chemotherapy. Currently she is on observation therapy. I'm not seen her personally in a while. She returns now because of ongoing cough and congestion and nocturnal [name] the last 1 year. There is no sputum production. She saw my colleague Dr. Lamonte Sakai a few weeks ago and was given Claritin which is helped partially but only a little bit. Symptoms still persist. She is not taking her Symbicort anymore. She does not even remember me prescribing this. FeNO 18ppb 09/05/2017 . [name] She's had significant weight gain since I last saw her . RSI cough score 21 and c/w LPR cough    Dr Lorenza [place] Reflux Symptom Index (> 13-15 suggestive of LPR cough) 0 -> 5  =  none ->severe problem 09/05/2017   Hoarseness of problem with voice 3  Clearing  Of Throat 4  Excess throat mucus or feeling of post nasal drip 1  Difficulty swallowing food, liquid or tablets 1  Cough after eating or lying down 3  Breathing difficulties or choking episodes 1  Troublesome or annoying cough 4  Sensation of something sticking in throat or lump in throat 0  Heartburn, chest pain, indigestion, or stomach acid coming up 4  TOTAL 21      has a past medical history of Cancer ([place]) (dx'd 09/2013), Diabetes ([place]) (09/08/2016), Diabetes mellitus, Diabetic neuropathy ([place]) (09/08/2016),  GERD (gastroesophageal reflux disease), History of hiatal hernia, radiation therapy (12/16/13-01/30/14), Hypertension, Malignant neoplasm of right upper lobe of lung ([place]) (11/28/2013), Neuropathy, Psoriasis, Psoriatic arthritis ([place]) (09/08/2016), Rheumatoid arthritis ([place]), and Shortness of breath dyspnea.   reports that she quit smoking about 19 years ago. Her smoking use included cigarettes. She has a 135.00 pack-year smoking history. she has never used smokeless tobacco.  Past Surgical History:  Procedure Laterality Date  . ABDOMINAL HYSTERECTOMY    . ABDOMINAL SURGERY    . CHOLECYSTECTOMY    . DILATION AND CURETTAGE OF UTERUS    . FRACTURE SURGERY    . TONSILLECTOMY      Allergies  Allergen Reactions  . Carboplatin Other (See Comments)    Neuropathy  . Remicade [Infliximab] Anaphylaxis  . Ciprofloxacin Rash  . Hydromorphone Rash    Immunization History  Administered Date(s) Administered  . Influenza, High Dose Seasonal PF 08/12/2016, 08/30/2017  . Influenza,inj,Quad PF,6+ Mos 11/07/2013, 07/14/2014, 08/25/2015  . PPD Test 03/02/2016  . Pneumococcal-Unspecified 12/02/2015  . Tdap 01/11/2017    Family History  Problem Relation Age of Onset  . Other Mother   . Other Father        failure to thrive  . Hypertension Other   . Stroke Other   . Heart attack Other   . Hemachromatosis Other   . Rheum arthritis Unknown        both  sets of grandparents and father     Current Outpatient Medications:  .  amLODipine (NORVASC) 2.5 MG tablet, Take 1 tablet (2.5 mg total) by mouth daily., Disp: 90 tablet, Rfl: 3 .  augmented betamethasone dipropionate (DIPROLENE-AF) 0.05 % ointment, APP AA BID PRN, Disp: , Rfl: 1 .  B-D UF III MINI PEN NEEDLES 31G X 5 MM MISC, USE TO CHECK SUGAR FIVE TIMES DAILY, Disp: 100 each, Rfl: 0 .  Continuous Blood Gluc Receiver (FREESTYLE LIBRE 14 DAY READER) DEVI, 1 Device by Does not apply route daily., Disp: 1 Device, Rfl: 0 .  Continuous Blood Gluc  Sensor (FREESTYLE LIBRE 14 DAY SENSOR) MISC, Inject 1 Device into the skin every 14 (fourteen) days., Disp: 3 each, Rfl: 2 .  DULoxetine (CYMBALTA) 60 MG capsule, Take 1 capsule (60 mg total) by mouth 2 (two) times daily., Disp: 60 capsule, Rfl: 12 .  glucose blood test strip, Use as instructed 4x a day - Verio, Disp: 200 each, Rfl: 5 .  Insulin Detemir (LEVEMIR FLEXTOUCH) 100 UNIT/ML Pen, Inject 20 units in the morning, and 25 units in the pm., Disp: 15 pen, Rfl: 3 .  insulin lispro (HUMALOG KWIKPEN) 100 UNIT/ML KiwkPen, ADMINISTER 15 TO 26 UNITS UNDER THE SKIN THREE TIMES DAILY, Disp: 15 pen, Rfl: 3 .  Insulin Pen Needle (B-D UF III MINI PEN NEEDLES) 31G X 5 MM MISC, USE TO take insulin FIVE TIMES DAILY, Disp: 200 each, Rfl: 5 .  loratadine (CLARITIN) 10 MG tablet, Take 1 tablet (10 mg total) by mouth daily., Disp: 30 tablet, Rfl: 3 .  LYRICA 150 MG capsule, TAKE 1 CAPSULE BY MOUTH TWICE DAILY, Disp: 60 capsule, Rfl: 5 .  omeprazole (PRILOSEC) 20 MG capsule, Take 1 capsule (20 mg total) by mouth daily., Disp: 30 capsule, Rfl: 11 .  ONETOUCH DELICA LANCETS FINE MISC, USE TO CHECK BLOOD SUGAR FOUR TIMES DAILY, Disp: 200 each, Rfl: 5 .  sulfaSALAzine (AZULFIDINE) 500 MG EC tablet, TAKE 2 TABLETS(1000 MG) BY MOUTH TWICE DAILY, Disp: 120 tablet, Rfl: 0 .  guaiFENesin (MUCINEX) 600 MG 12 hr tablet, Take 1 tablet (600 mg total) by mouth 2 (two) times daily. (Patient not taking: Reported on 09/05/2017), Disp: 30 tablet, Rfl: 3   Review of Systems     Objective:   Physical Exam  Constitutional: She is oriented to person, place, and time. She appears well-developed and well-nourished. No distress.  Obese now No longer in wheel chair  HENT:  Head: Normocephalic and atraumatic.  Right Ear: External ear normal.  Left Ear: External ear normal.  Mouth/Throat: Oropharynx is clear and moist. No oropharyngeal exudate.  Eyes: Conjunctivae and EOM are normal. Pupils are equal, round, and reactive to light.  Right eye exhibits no discharge. Left eye exhibits no discharge. No scleral icterus.  Neck: Normal range of motion. Neck supple. No JVD present. No tracheal deviation present. No thyromegaly present.  Cardiovascular: Normal rate, regular rhythm, normal heart sounds and intact distal pulses. Exam reveals no gallop and no friction rub.  No murmur heard. Pulmonary/Chest: Effort normal and breath sounds normal. No respiratory distress. She has no wheezes. She has no rales. She exhibits no tenderness.  Abdominal: Soft. Bowel sounds are normal. She exhibits no distension and no mass. There is no tenderness. There is no rebound and no guarding.  Musculoskeletal: Normal range of motion. She exhibits no edema or tenderness.  Has cane  Lymphadenopathy:    She has no cervical adenopathy.  Neurological: She is  alert and oriented to person, place, and time. She has normal reflexes. No cranial nerve deficit. She exhibits normal muscle tone. Coordination normal.  Skin: Skin is warm and dry. No rash noted. She is not diaphoretic. No erythema. No pallor.  Psychiatric: She has a normal mood and affect. Her behavior is normal. Judgment and thought content normal.  No longer depressed and teary  Vitals reviewed.   Vitals:   09/05/17 1003  BP: 126/80  Pulse: (!) 103  SpO2: 95%  Weight: 194 lb (88 kg)  Height: 5' 6.5" (1.689 m)    Estimated body mass index is 30.84 kg/m as calculated from the following:   Height as of this encounter: 5' 6.5" (1.689 m).   Weight as of this encounter: 194 lb (88 kg).      Assessment:       ICD-10-CM   1. Cough R05   2. Pulmonary emphysema, unspecified emphysema type ([place]) J43.9        Plan:      Restart symbicort 80/4.5, 2 puff twice daily Await CT chest results from Dr Earlie Server  Followup 4-8 weeks with Dr Chase Caller to report prgress  - cough score at followup   > 50% of this > 25 min visit spent in face to face counseling or coordination of care     Dr. Brand Males, M.D., [facility].C.P Pulmonary and Critical Care Medicine Staff Physician [facility] Pager: [phone], If no answer or between  15:00h - 7:00h: call [phone]  09/05/2017 10:42 AM

## 2017-09-05 NOTE — Patient Instructions (Signed)
ICD-10-CM   1. Cough R05   2. Pulmonary emphysema, unspecified emphysema type (HCC) J43.9     Restart symbicort 80/4.5, 2 puff twice daily Await CT chest results from Dr Earlie Server  Followup 4-8 weeks with Dr Chase Caller to report prgress  - cough score at followup

## 2017-09-08 ENCOUNTER — Encounter (HOSPITAL_COMMUNITY): Payer: Self-pay

## 2017-09-08 ENCOUNTER — Ambulatory Visit (HOSPITAL_COMMUNITY)
Admission: RE | Admit: 2017-09-08 | Discharge: 2017-09-08 | Disposition: A | Discharge status: Home / Self Care | Payer: Medicare Other | Source: Ambulatory Visit | Attending: Internal Medicine | Admitting: Internal Medicine

## 2017-09-08 ENCOUNTER — Encounter: Payer: Self-pay | Admitting: Rheumatology

## 2017-09-08 ENCOUNTER — Telehealth: Payer: Self-pay | Admitting: Internal Medicine

## 2017-09-08 ENCOUNTER — Ambulatory Visit: Payer: Medicare Other | Admitting: Rheumatology

## 2017-09-08 ENCOUNTER — Other Ambulatory Visit (HOSPITAL_BASED_OUTPATIENT_CLINIC_OR_DEPARTMENT_OTHER): Payer: Medicare Other

## 2017-09-08 VITALS — BP 126/79 | HR 94 | Resp 20 | Ht 66.0 in | Wt 195.0 lb

## 2017-09-08 DIAGNOSIS — L409 Psoriasis, unspecified: Secondary | ICD-10-CM | POA: Diagnosis not present

## 2017-09-08 DIAGNOSIS — Z8659 Personal history of other mental and behavioral disorders: Secondary | ICD-10-CM

## 2017-09-08 DIAGNOSIS — Z85118 Personal history of other malignant neoplasm of bronchus and lung: Secondary | ICD-10-CM | POA: Diagnosis not present

## 2017-09-08 DIAGNOSIS — J439 Emphysema, unspecified: Secondary | ICD-10-CM | POA: Insufficient documentation

## 2017-09-08 DIAGNOSIS — C342 Malignant neoplasm of middle lobe, bronchus or lung: Secondary | ICD-10-CM

## 2017-09-08 DIAGNOSIS — M545 Low back pain, unspecified: Secondary | ICD-10-CM

## 2017-09-08 DIAGNOSIS — M19041 Primary osteoarthritis, right hand: Secondary | ICD-10-CM | POA: Diagnosis not present

## 2017-09-08 DIAGNOSIS — M792 Neuralgia and neuritis, unspecified: Secondary | ICD-10-CM

## 2017-09-08 DIAGNOSIS — Z79899 Other long term (current) drug therapy: Secondary | ICD-10-CM | POA: Diagnosis not present

## 2017-09-08 DIAGNOSIS — Z8679 Personal history of other diseases of the circulatory system: Secondary | ICD-10-CM

## 2017-09-08 DIAGNOSIS — C3411 Malignant neoplasm of upper lobe, right bronchus or lung: Secondary | ICD-10-CM | POA: Diagnosis not present

## 2017-09-08 DIAGNOSIS — Z862 Personal history of diseases of the blood and blood-forming organs and certain disorders involving the immune mechanism: Secondary | ICD-10-CM

## 2017-09-08 DIAGNOSIS — I700 Atherosclerosis of aorta: Secondary | ICD-10-CM | POA: Diagnosis not present

## 2017-09-08 DIAGNOSIS — M2141 Flat foot [pes planus] (acquired), right foot: Secondary | ICD-10-CM

## 2017-09-08 DIAGNOSIS — M19042 Primary osteoarthritis, left hand: Secondary | ICD-10-CM

## 2017-09-08 DIAGNOSIS — K760 Fatty (change of) liver, not elsewhere classified: Secondary | ICD-10-CM | POA: Insufficient documentation

## 2017-09-08 DIAGNOSIS — Z8639 Personal history of other endocrine, nutritional and metabolic disease: Secondary | ICD-10-CM

## 2017-09-08 DIAGNOSIS — L4050 Arthropathic psoriasis, unspecified: Secondary | ICD-10-CM

## 2017-09-08 DIAGNOSIS — K229 Disease of esophagus, unspecified: Secondary | ICD-10-CM | POA: Insufficient documentation

## 2017-09-08 DIAGNOSIS — G2581 Restless legs syndrome: Secondary | ICD-10-CM

## 2017-09-08 DIAGNOSIS — I2510 Atherosclerotic heart disease of native coronary artery without angina pectoris: Secondary | ICD-10-CM | POA: Diagnosis not present

## 2017-09-08 DIAGNOSIS — T451X5A Adverse effect of antineoplastic and immunosuppressive drugs, initial encounter: Secondary | ICD-10-CM

## 2017-09-08 DIAGNOSIS — G8929 Other chronic pain: Secondary | ICD-10-CM

## 2017-09-08 DIAGNOSIS — M2142 Flat foot [pes planus] (acquired), left foot: Secondary | ICD-10-CM

## 2017-09-08 DIAGNOSIS — G620 Drug-induced polyneuropathy: Secondary | ICD-10-CM | POA: Diagnosis not present

## 2017-09-08 DIAGNOSIS — Z8719 Personal history of other diseases of the digestive system: Secondary | ICD-10-CM

## 2017-09-08 LAB — COMPREHENSIVE METABOLIC PANEL
ALT: 17 U/L (ref 0–55)
ANION GAP: 9 meq/L (ref 3–11)
AST: 15 U/L (ref 5–34)
Albumin: 3.8 g/dL (ref 3.5–5.0)
Alkaline Phosphatase: 182 U/L — ABNORMAL HIGH (ref 40–150)
BILIRUBIN TOTAL: 0.45 mg/dL (ref 0.20–1.20)
BUN: 13.8 mg/dL (ref 7.0–26.0)
CO2: 28 meq/L (ref 22–29)
Calcium: 9.4 mg/dL (ref 8.4–10.4)
Chloride: 103 mEq/L (ref 98–109)
Creatinine: 0.9 mg/dL (ref 0.6–1.1)
EGFR: 60 mL/min/{1.73_m2} — AB (ref >60)
GLUCOSE: 182 mg/dL — AB (ref 70–140)
POTASSIUM: 4.1 meq/L (ref 3.5–5.1)
SODIUM: 141 meq/L (ref 136–145)
TOTAL PROTEIN: 7.1 g/dL (ref 6.4–8.3)

## 2017-09-08 LAB — CBC WITH DIFFERENTIAL/PLATELET
BASO%: 1.1 % (ref 0.0–2.0)
BASOS ABS: 0.1 10*3/uL (ref 0.0–0.1)
EOS%: 3.5 % (ref 0.0–7.0)
Eosinophils Absolute: 0.3 10*3/uL (ref 0.0–0.5)
HEMATOCRIT: 43 % (ref 34.8–46.6)
HEMOGLOBIN: 13.8 g/dL (ref 11.6–15.9)
LYMPH#: 1.3 10*3/uL (ref 0.9–3.3)
LYMPH%: 17.8 % (ref 14.0–49.7)
MCH: 28.9 pg (ref 25.1–34.0)
MCHC: 32.1 g/dL (ref 31.5–36.0)
MCV: 90.1 fL (ref 79.5–101.0)
MONO#: 0.6 10*3/uL (ref 0.1–0.9)
MONO%: 8.3 % (ref 0.0–14.0)
NEUT#: 4.9 10*3/uL (ref 1.5–6.5)
NEUT%: 69.3 % (ref 38.4–76.8)
PLATELETS: 155 10*3/uL (ref 145–400)
RBC: 4.77 10*6/uL (ref 3.70–5.45)
RDW: 16.8 % — AB (ref 11.2–14.5)
WBC: 7.1 10*3/uL (ref 3.9–10.3)

## 2017-09-08 MED ORDER — IOPAMIDOL (ISOVUE-300) INJECTION 61%
75.0000 mL | Freq: Once | INTRAVENOUS | Status: AC | PRN
  Administered 2017-09-08: 75 mL via INTRAVENOUS

## 2017-09-08 MED ORDER — IOPAMIDOL (ISOVUE-300) INJECTION 61%
INTRAVENOUS | Status: AC
  Filled 2017-09-08: qty 75

## 2017-09-08 NOTE — Patient Instructions (Signed)
Standing Labs We placed an order today for your standing lab work.    Please come back and get your standing labs in November and every 3 months.  We have open lab Monday through Friday from 8:30-11:30 AM and 1:30-4 PM at the office of Dr. Bo Merino.   The office is located at [address], [place], [place], [place] [address] No appointment is necessary.   Labs are drawn by [facility].  You may receive a bill from [facility] for your lab work. If you have any questions regarding directions or hours of operation,  please call [phone].

## 2017-09-08 NOTE — Telephone Encounter (Signed)
Lvm advising appt time and provider chgd on 11/14 from [facility] at 2pm to [facility] at 8.30am due to md admin time.

## 2017-09-13 ENCOUNTER — Ambulatory Visit: Payer: Medicare Other

## 2017-09-13 ENCOUNTER — Encounter: Payer: Self-pay | Admitting: *Deleted

## 2017-09-13 ENCOUNTER — Encounter: Payer: Self-pay | Admitting: Oncology

## 2017-09-13 ENCOUNTER — Ambulatory Visit: Payer: Medicare Other | Admitting: Internal Medicine

## 2017-09-13 ENCOUNTER — Telehealth: Payer: Self-pay | Admitting: Internal Medicine

## 2017-09-13 ENCOUNTER — Ambulatory Visit: Payer: Medicare Other | Admitting: Oncology

## 2017-09-13 VITALS — BP 173/93 | HR 104 | Temp 98.0°F | Resp 18 | Ht 66.0 in | Wt 195.7 lb

## 2017-09-13 DIAGNOSIS — C3411 Malignant neoplasm of upper lobe, right bronchus or lung: Secondary | ICD-10-CM

## 2017-09-13 DIAGNOSIS — Z85118 Personal history of other malignant neoplasm of bronchus and lung: Secondary | ICD-10-CM | POA: Diagnosis not present

## 2017-09-13 DIAGNOSIS — Z923 Personal history of irradiation: Secondary | ICD-10-CM | POA: Diagnosis not present

## 2017-09-13 DIAGNOSIS — Z9221 Personal history of antineoplastic chemotherapy: Secondary | ICD-10-CM | POA: Diagnosis not present

## 2017-09-13 NOTE — Telephone Encounter (Signed)
Gave avs and calendar for March 2019 °

## 2017-09-13 NOTE — Progress Notes (Signed)
[place] OFFICE PROGRESS NOTE  Binnie Rail, MD [place] [address]  DIAGNOSIS: Squamous cell lung cancer  Primary site: Lung (Right)  Staging method: AJCC 7th Edition  Clinical: Stage IIIB (T3, N3, M0)  Summary: Stage IIIB (T3, N3, M0)   PRIOR THERAPY: 1) Concurrent chemoradiation with weekly chemotherapy in the form of carboplatin for AUC of 2 and paclitaxel 45 mg/m2. Status post 7 week of treatment. Last dose was given 01/27/2014 no significant response in her disease.  2) Consolidation chemotherapy with carboplatin for AUC of 5 and paclitaxel 175 mg/M2 every 3 weeks with Neulasta support. First dose on 04/15/2014. Status post 3 cycles. Carboplatin was discontinued starting cycle #2 secondary to hypersensitivity reaction. 3) Immunotherapy with Opdivo (Nivolumab) 3mg /kg given every 2 weeks. Status post 33 cycles discontinued secondary to intolerance.   CURRENT THERAPY: Observation  INTERVAL HISTORY: Megan [name] 76 y.o. female returns for routine follow-up visit by herself.  The patient has been doing very well except for mild fatigue.  She has been eating well and gaining back her weight.  She denies fevers and chills.  Denies chest pain, shortness of breath, cough, hemoptysis.  Denies nausea, vomiting, constipation, diarrhea.  The patient had a recent restaging CT scan is here to discuss the results.  MEDICAL HISTORY: Past Medical History:  Diagnosis Date  . Cancer ([place]) dx'd 09/2013   Lung ca  . Diabetes ([place]) 09/08/2016   type I diabetic patient reported on 09/13/16   . Diabetes mellitus    insulin  . Diabetic neuropathy ([place]) 09/08/2016  . GERD (gastroesophageal reflux disease)    no meds for  . History of hiatal hernia   . Hx of radiation therapy 12/16/13-01/30/14   lung 66Gy  . Hypertension   . Malignant neoplasm of right upper lobe of lung ([place]) 11/28/2013  . Neuropathy   . Psoriasis   . Psoriatic arthritis ([place])  09/08/2016  . Rheumatoid arthritis ([place])   . Shortness of breath dyspnea    with exertion    ALLERGIES:  is allergic to carboplatin; remicade [infliximab]; ciprofloxacin; and hydromorphone.  MEDICATIONS:  Current Outpatient Medications  Medication Sig Dispense Refill  . amLODipine (NORVASC) 2.5 MG tablet Take 1 tablet (2.5 mg total) by mouth daily. 90 tablet 3  . augmented betamethasone dipropionate (DIPROLENE-AF) 0.05 % ointment APP AA BID PRN  1  . B-D UF III MINI PEN NEEDLES 31G X 5 MM MISC USE TO CHECK SUGAR FIVE TIMES DAILY 100 each 0  . budesonide-formoterol (SYMBICORT) 80-4.5 MCG/ACT inhaler Inhale 2 puffs 2 (two) times daily into the lungs. 1 Inhaler 6  . Continuous Blood Gluc Receiver (FREESTYLE LIBRE 14 DAY READER) DEVI 1 Device by Does not apply route daily. 1 Device 0  . Continuous Blood Gluc Sensor (FREESTYLE LIBRE 14 DAY SENSOR) MISC Inject 1 Device into the skin every 14 (fourteen) days. 3 each 2  . DULoxetine (CYMBALTA) 60 MG capsule Take 1 capsule (60 mg total) by mouth 2 (two) times daily. 60 capsule 12  . glucose blood test strip Use as instructed 4x a day - Verio 200 each 5  . guaiFENesin (MUCINEX) 600 MG 12 hr tablet Take 1 tablet (600 mg total) by mouth 2 (two) times daily. 30 tablet 3  . Insulin Detemir (LEVEMIR FLEXTOUCH) 100 UNIT/ML Pen Inject 20 units in the morning, and 25 units in the pm. 15 pen 3  . insulin lispro (HUMALOG KWIKPEN) 100 UNIT/ML KiwkPen ADMINISTER 15  TO 26 UNITS UNDER THE SKIN THREE TIMES DAILY 15 pen 3  . Insulin Pen Needle (B-D UF III MINI PEN NEEDLES) 31G X 5 MM MISC USE TO take insulin FIVE TIMES DAILY 200 each 5  . loratadine (CLARITIN) 10 MG tablet Take 1 tablet (10 mg total) by mouth daily. 30 tablet 3  . LYRICA 150 MG capsule TAKE 1 CAPSULE BY MOUTH TWICE DAILY 60 capsule 5  . omeprazole (PRILOSEC) 20 MG capsule Take 1 capsule (20 mg total) by mouth daily. 30 capsule 11  . ONETOUCH DELICA LANCETS FINE MISC USE TO CHECK BLOOD SUGAR FOUR  TIMES DAILY 200 each 5  . sulfaSALAzine (AZULFIDINE) 500 MG EC tablet TAKE 2 TABLETS(1000 MG) BY MOUTH TWICE DAILY 120 tablet 0   No current facility-administered medications for this visit.     SURGICAL HISTORY:  Past Surgical History:  Procedure Laterality Date  . ABDOMINAL HYSTERECTOMY    . ABDOMINAL SURGERY    . CHOLECYSTECTOMY    . DILATION AND CURETTAGE OF UTERUS    . FRACTURE SURGERY    . TONSILLECTOMY      REVIEW OF SYSTEMS:   Review of Systems  Constitutional: Negative for appetite change, chills, fever and unexpected weight change. Positive for mild fatigue. HENT:   Negative for mouth sores, nosebleeds, sore throat and trouble swallowing.   Eyes: Negative for eye problems and icterus.  Respiratory: Negative for cough, hemoptysis, shortness of breath and wheezing.   Cardiovascular: Negative for chest pain and leg swelling.  Gastrointestinal: Negative for abdominal pain, constipation, diarrhea, nausea and vomiting.  Genitourinary: Negative for bladder incontinence, difficulty urinating, dysuria, frequency and hematuria.   Musculoskeletal: Negative for back pain, gait problem, neck pain and neck stiffness.  Skin: Negative for itching and rash.  Neurological: Negative for dizziness, extremity weakness, gait problem, headaches, light-headedness and seizures.  Hematological: Negative for adenopathy. Does not bruise/bleed easily.  Psychiatric/Behavioral: Negative for confusion, depression and sleep disturbance. The patient is not nervous/anxious.     PHYSICAL EXAMINATION:  Blood pressure (!) 173/93, pulse (!) 104, temperature 98 F (36.7 C), temperature source Oral, resp. rate 18, height 5\' 6"  (1.676 m), weight 195 lb 11.2 oz (88.8 kg), SpO2 97 %.  ECOG PERFORMANCE STATUS: 1 - Symptomatic but completely ambulatory  Physical Exam  Constitutional: Oriented to person, place, and time and well-developed, well-nourished, and in no distress. No distress.  HENT:  Head:  Normocephalic and atraumatic.  Mouth/Throat: Oropharynx is clear and moist. No oropharyngeal exudate.  Eyes: Conjunctivae are normal. Right eye exhibits no discharge. Left eye exhibits no discharge. No scleral icterus.  Neck: Normal range of motion. Neck supple.  Cardiovascular: Normal rate, regular rhythm, normal heart sounds and intact distal pulses.   Pulmonary/Chest: Effort normal and breath sounds normal. No respiratory distress. No wheezes. No rales.  Abdominal: Soft. Bowel sounds are normal. Exhibits no distension and no mass. There is no tenderness.  Musculoskeletal: Normal range of motion. Exhibits no edema.  Lymphadenopathy:    No cervical adenopathy.  Neurological: Alert and oriented to person, place, and time. Exhibits normal muscle tone. Gait normal. Coordination normal.  Skin: Skin is warm and dry. No rash noted. Not diaphoretic. No erythema. No pallor.  Psychiatric: Mood, memory and judgment normal.  Vitals reviewed.  LABORATORY DATA: Lab Results  Component Value Date   WBC 7.1 09/08/2017   HGB 13.8 09/08/2017   HCT 43.0 09/08/2017   MCV 90.1 09/08/2017   PLT 155 09/08/2017  Chemistry      Component Value Date/Time   NA 141 09/08/2017 1213   K 4.1 09/08/2017 1213   CL 103 03/25/2017 0500   CO2 28 09/08/2017 1213   BUN 13.8 09/08/2017 1213   CREATININE 0.9 09/08/2017 1213   GLU 176 03/07/2016      Component Value Date/Time   CALCIUM 9.4 09/08/2017 1213   ALKPHOS 182 (H) 09/08/2017 1213   AST 15 09/08/2017 1213   ALT 17 09/08/2017 1213   BILITOT 0.45 09/08/2017 1213       RADIOGRAPHIC STUDIES:  Ct Chest W Contrast  Result Date: 09/08/2017 CLINICAL DATA:  Lung cancer diagnosed in 2015 with chemotherapy completed in December 2016. Radiation therapy completed 4/15. Cough and shortness of breath. Right upper lobe primary. EXAM: CT CHEST WITH CONTRAST TECHNIQUE: Multidetector CT imaging of the chest was performed during intravenous contrast  administration. CONTRAST:  63mL ISOVUE-300 IOPAMIDOL (ISOVUE-300) INJECTION 61% COMPARISON:  05/17/2017 FINDINGS: Cardiovascular: Aortic atherosclerosis. Tortuous thoracic aorta. Normal heart size, without pericardial effusion. Lad and left main coronary artery atherosclerosis. No central pulmonary embolism, on this non-dedicated study. Mediastinum/Nodes: No supraclavicular adenopathy. No mediastinal or left hilar adenopathy. Right hilum not well evaluated secondary to surrounding volume loss. Tiny hiatal hernia. Distal esophageal wall thickening is felt to be slightly improved, including on image 70/series 2. Lungs/Pleura: Resolved right-sided pleural effusion mild centrilobular emphysema. Similar right-sided endobronchial compression, most significant in the right upper and right middle lobes. Similar collapse of the right middle lobe and at least portions of the right upper lobe. No well-defined underlying mass.calcified left lower lobe granuloma. Upper Abdomen: Hepatic steatosis. Well-circumscribed hypoattenuating liver lesions are similar, most consistent with cysts. Cholecystectomy. Normal imaged portions of the stomach, pancreas, adrenal glands, kidneys. Old granulomatous disease in the spleen. Musculoskeletal: Moderate thoracic spondylosis. IMPRESSION: 1. Resolved tiny right pleural effusion compared to 05/17/2017. 2. Otherwise, similar appearance of the chest. Right middle and upper lobe collapse secondary to chronic endobronchial compression, treatment effects. 3. Coronary artery atherosclerosis. Aortic Atherosclerosis (ICD10-I70.0). 4.  Emphysema (ICD10-J43.9). 5. Improved esophageal wall thickening, likely representing radiation induced esophagitis. 6. Hepatic steatosis. Electronically Signed   By: Abigail Miyamoto M.D.   On: 09/08/2017 16:04     ASSESSMENT/PLAN:  Malignant neoplasm of right upper lobe of lung [facility]) This is a very pleasant 76 year old white female with metastatic non-small cell lung  cancer status post a course of concurrent chemoradiation as well as consolidation chemotherapy discontinued secondary to intolerance and significant neuropathy. The patient was treated with immunotherapy with Nivolumab status post 33 cycles and tolerated the treatment well but it was discontinued secondary to chemotherapy-induced colitis and diarrhea. She is currently on observation and feeling fine.  She had a recent restaging CT scan of the chest.  The patient was seen with Dr. Julien Nordmann.  CT scan results were discussed with the patient.  CT scan shows stable disease.  Recommend continued observation.  The patient will have a restaging CT scan of the chest in 4 months and follow-up thereafter to discuss the results.   She was advised to call immediately if she has any concerning symptoms in the interval. The patient voices understanding of current disease status and treatment options and is in agreement with the current care plan. All questions were answered. The patient knows to call the clinic with any problems, questions or concerns. We can certainly see the patient much sooner if necessary.  Orders Placed This Encounter  Procedures  . CT CHEST W  CONTRAST    Standing Status:   Future    Standing Expiration Date:   09/13/2018    Order Specific Question:   If indicated for the ordered procedure, I authorize the administration of contrast media per Radiology protocol    Answer:   Yes    Order Specific Question:   Preferred imaging location?    Answer:   [facility]    Order Specific Question:   Radiology Contrast Protocol - do NOT remove file path    Answer:   [email or web address]    Order Specific Question:   Reason for Exam additional comments    Answer:   lung cancer. restaging.  Megan [name] CBC with Differential/Platelet    Standing Status:   Future    Standing Expiration Date:   09/13/2018  . Comprehensive metabolic panel    Standing Status:   Future     Standing Expiration Date:   09/13/2018   Mikey Bussing, DNP, AGPCNP-BC, AOCNP 09/13/17  ADDENDUM: Hematology/Oncology Attending: I had a face-to-face encounter with the patient today.  I recommended her care plan.  This is a very pleasant 76 years old white female with metastatic non-small cell lung cancer, squamous cell carcinoma that was initially diagnosed with concurrent chemoradiation followed by consolidation chemotherapy followed by immunotherapy was Nivolumab status post series 3 cycles discontinued secondary to chemotherapy-induced colitis and diarrhea.  The patient has been on observation for the last 2 years and she has been feeling much better. Her recent CT scan of the chest showed no evidence for disease progression.  I discussed the scan results with the patient today and recommended for her to continue on observation with repeat CT scan of the chest in 4 months. She was advised to call immediately if she has any concerning symptoms in the interval.  Disclaimer: This note was dictated with voice recognition software. Similar sounding words can inadvertently be transcribed and may be missed upon review. Eilleen Kempf, MD 09/13/17

## 2017-09-13 NOTE — Assessment & Plan Note (Addendum)
This is a very pleasant 76 year old white female with metastatic non-small cell lung cancer status post a course of concurrent chemoradiation as well as consolidation chemotherapy discontinued secondary to intolerance and significant neuropathy. The patient was treated with immunotherapy with Nivolumab status post 33 cycles and tolerated the treatment well but it was discontinued secondary to chemotherapy-induced colitis and diarrhea. She is currently on observation and feeling fine.  She had a recent restaging CT scan of the chest.  The patient was seen with Dr. Julien Nordmann.  CT scan results were discussed with the patient.  CT scan shows stable disease.  Recommend continued observation.  The patient will have a restaging CT scan of the chest in 4 months and follow-up thereafter to discuss the results.   She was advised to call immediately if she has any concerning symptoms in the interval. The patient voices understanding of current disease status and treatment options and is in agreement with the current care plan. All questions were answered. The patient knows to call the clinic with any problems, questions or concerns. We can certainly see the patient much sooner if necessary.

## 2017-09-15 ENCOUNTER — Other Ambulatory Visit: Payer: Self-pay | Admitting: Internal Medicine

## 2017-09-15 ENCOUNTER — Other Ambulatory Visit: Payer: Self-pay | Admitting: Rheumatology

## 2017-09-15 NOTE — Telephone Encounter (Signed)
Last visit   Next visit  Labs

## 2017-09-18 NOTE — Telephone Encounter (Signed)
Last visit: 09/08/17 Next visit due in April 2019. Message sent to the front to schedule patient.  Labs: 09/08/17 Elevated glucose and elevated alkaline phosphatase (Labs by PCP)  Faythe Ghee to refill per Dr. Estanislado Pandy

## 2017-09-19 IMAGING — CR DG ANKLE COMPLETE 3+V*L*
4 series · 4 of 4 positions shown · non-contrast
Comparison: 07/22/2014.

CLINICAL DATA: 75-year-old female fell in kitchen. Ankle pain.
Initial encounter.

EXAM:
LEFT ANKLE COMPLETE - 3+ VIEW

[x ankle ap left]
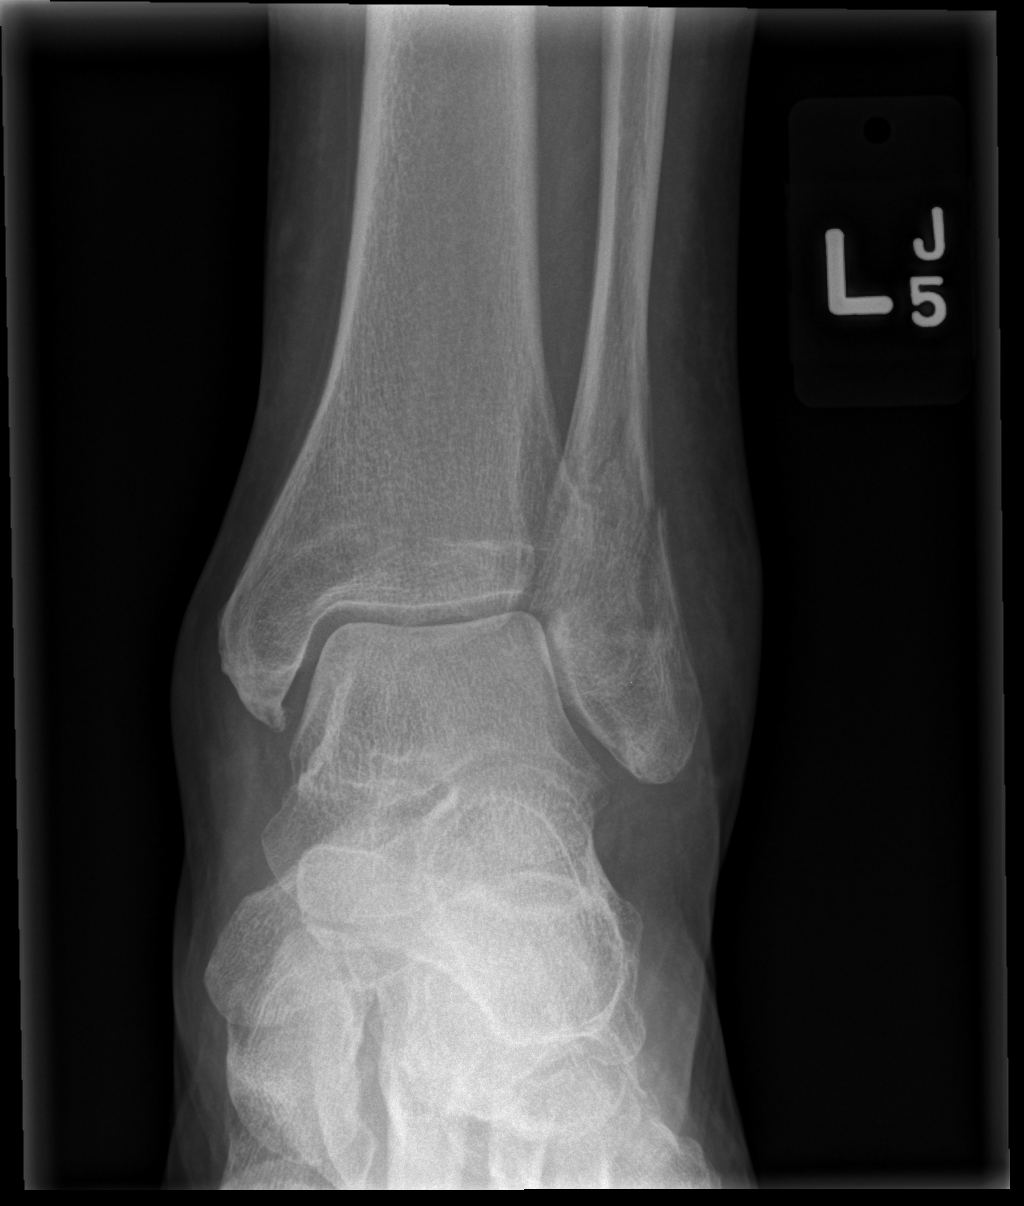

[x ankle obl left]
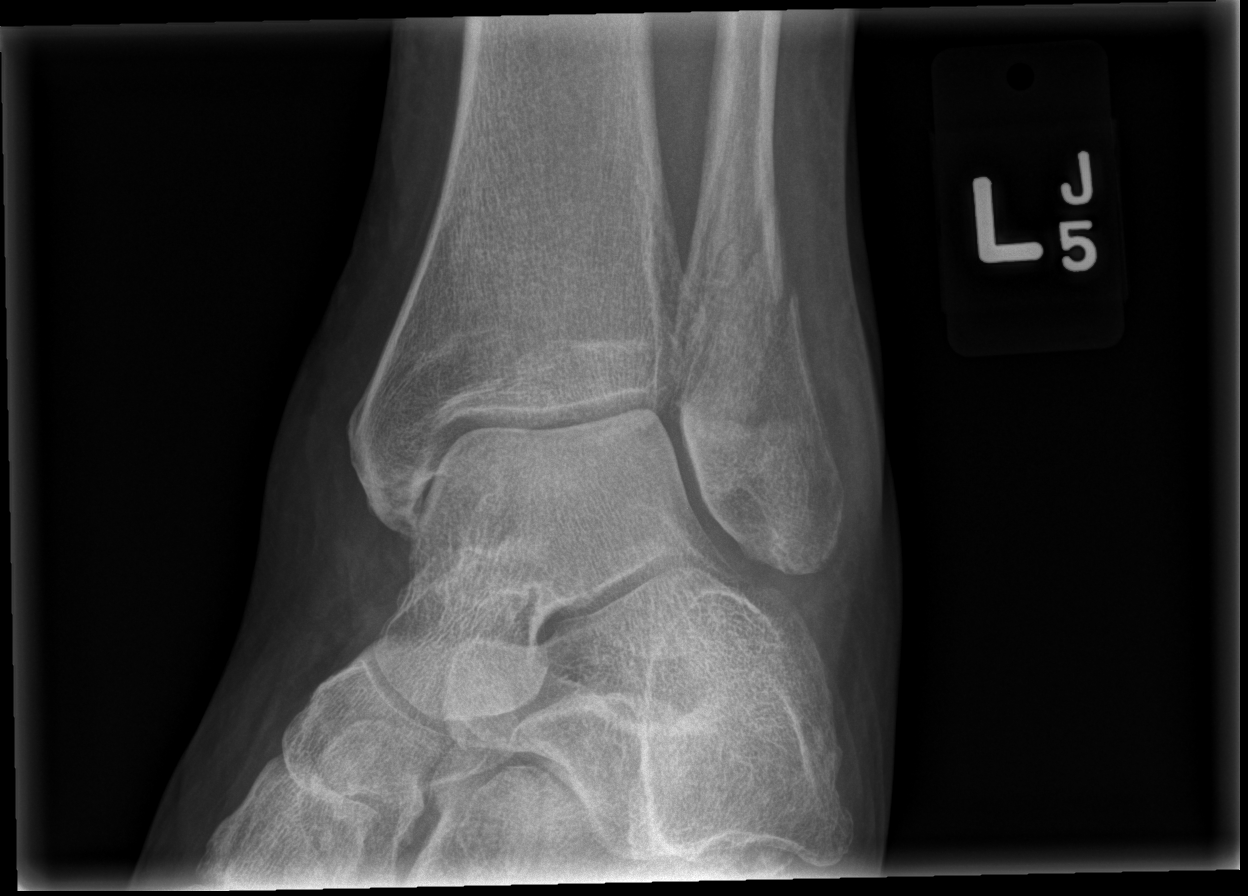

[x ankle lat left (1 of 2)]
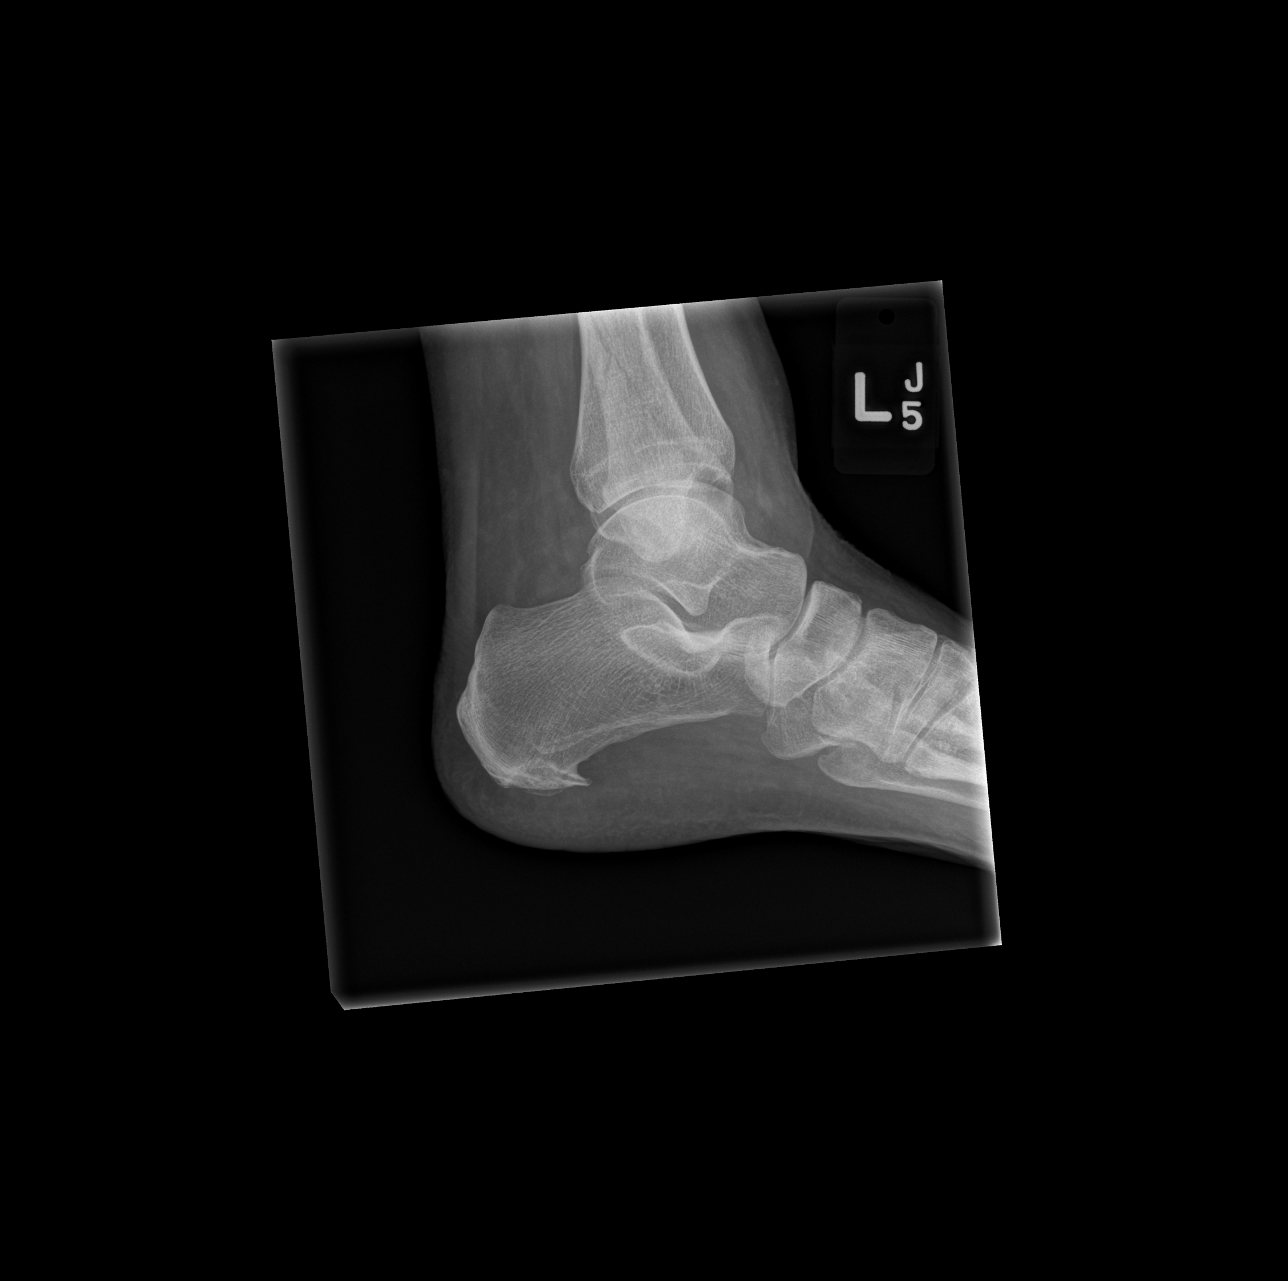

[x ankle lat left (2 of 2)]
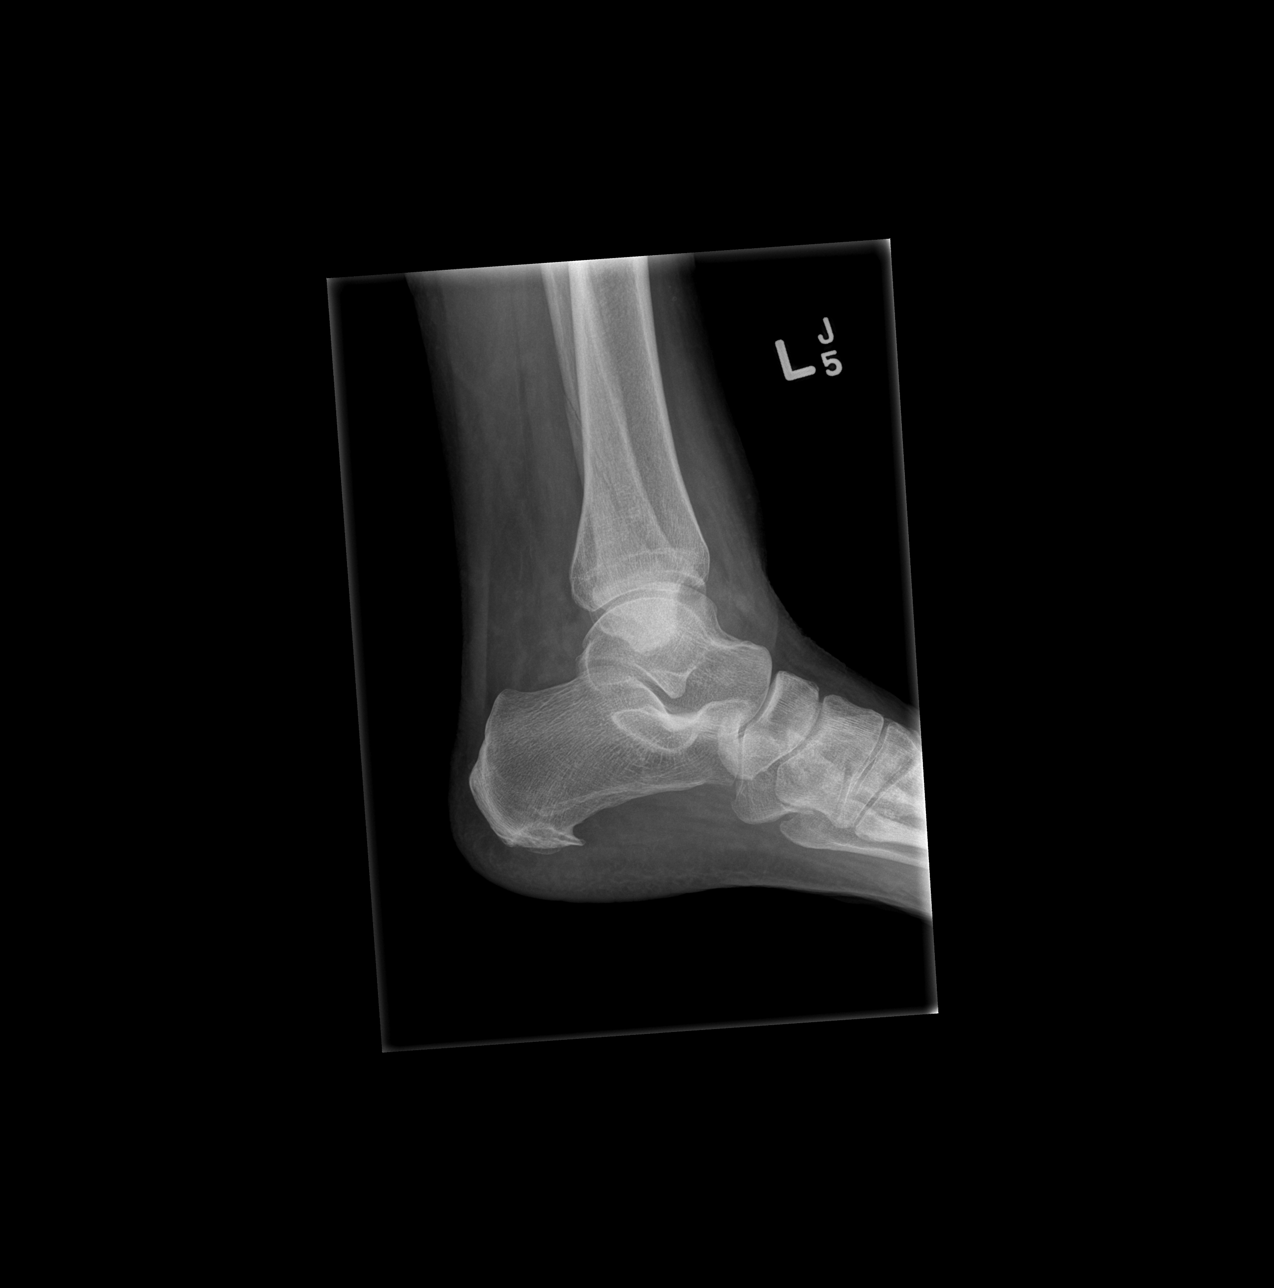

[4 of 4 positions shown; findings below may reference images not displayed]

FINDINGS: Oblique fracture of distal left fibula with slight separation of
fracture fragments.

Slight irregularity of the cuboid. If there were tenderness in this
region, left foot films recommended for further delineation.

Plantar spur.
IMPRESSION: Oblique fracture of distal left fibula with slight separation of
fracture fragments.

Slight irregularity of the cuboid. If there were tenderness in this
region, left foot films recommended for further delineation.

## 2017-09-19 IMAGING — CR DG FOOT COMPLETE 3+V*L*
3 series · 3 of 3 positions shown · non-contrast
Comparison: 02/01/2016 ankle films.

CLINICAL DATA: 75-year-old female with ankle fracture. Foot pain.
Post fall. Subsequent encounter.

EXAM:
LEFT FOOT - COMPLETE 3+ VIEW

[x foot ap left]
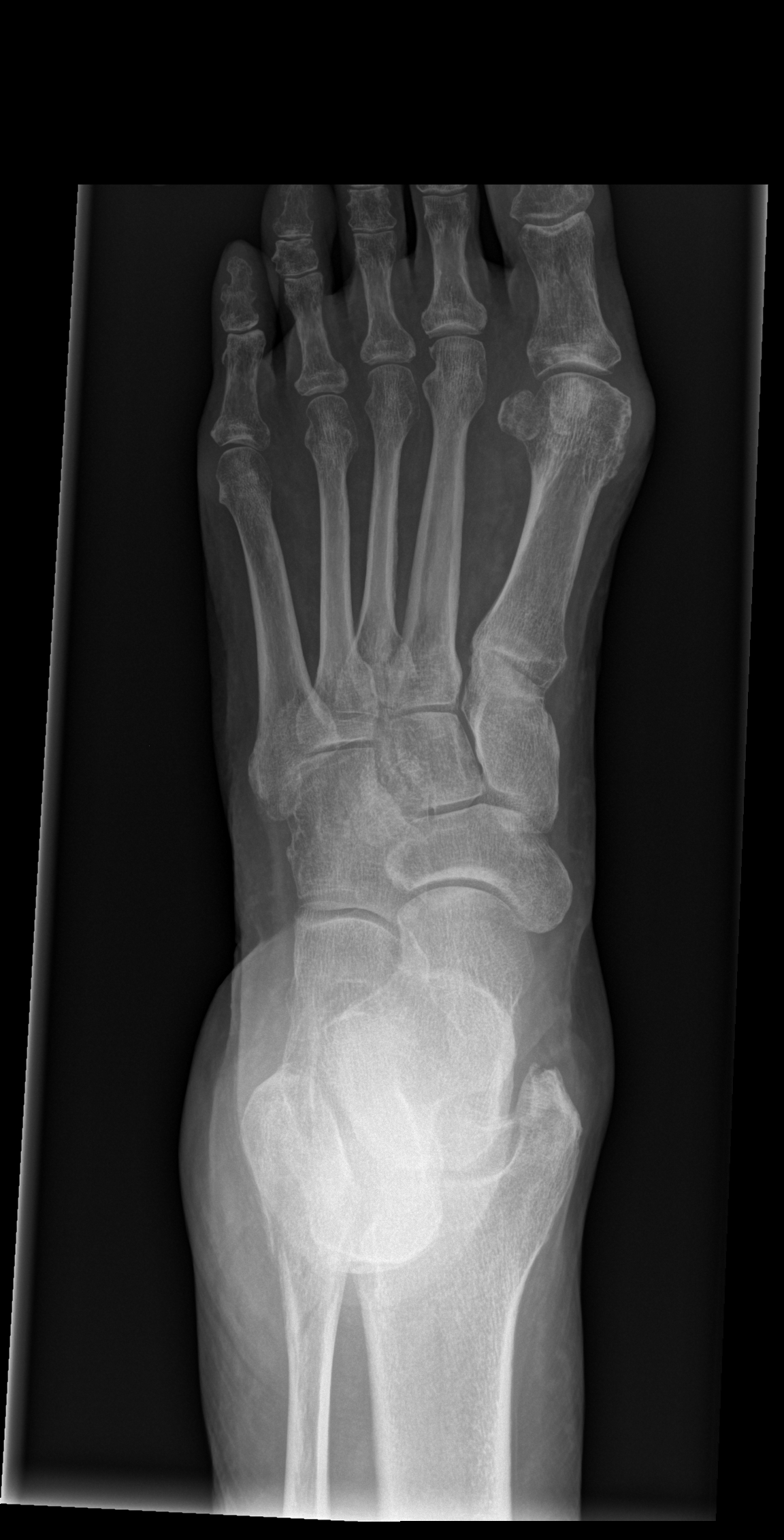

[x foot obl left]
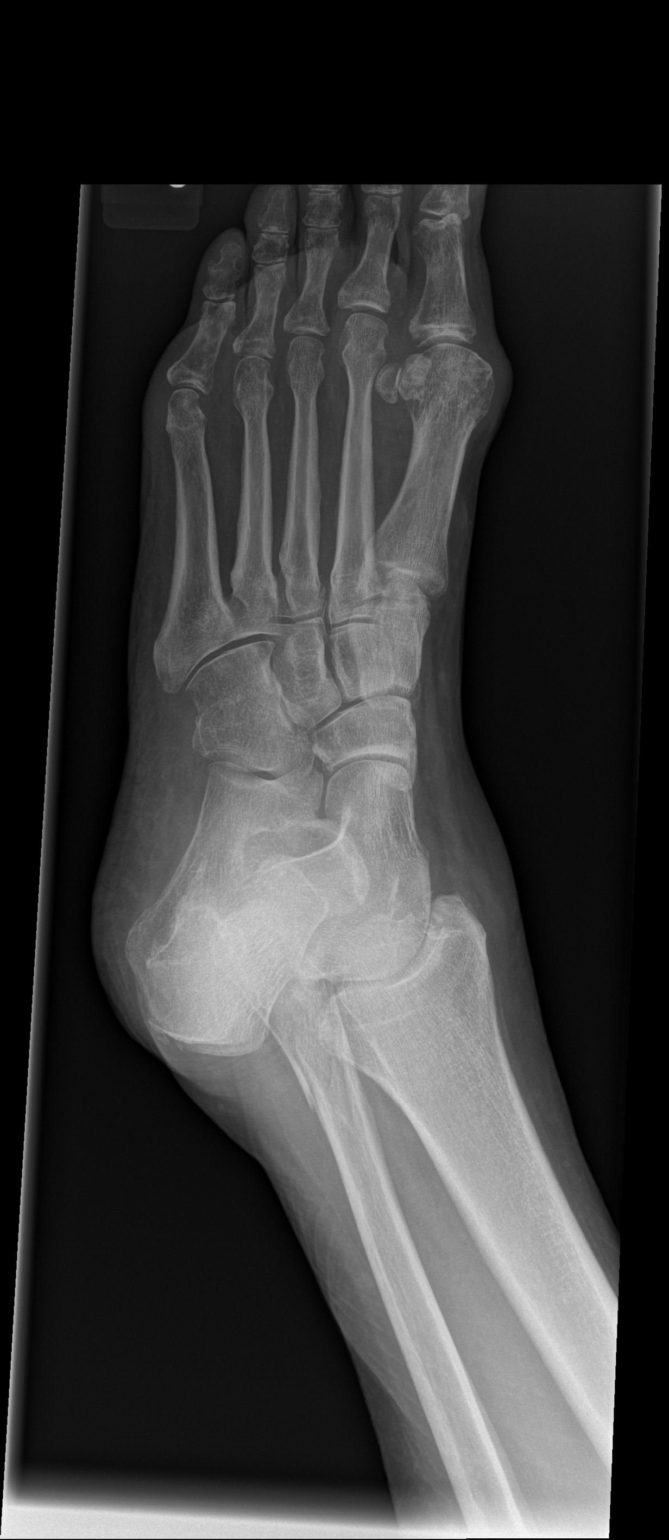

[x foot lat left]
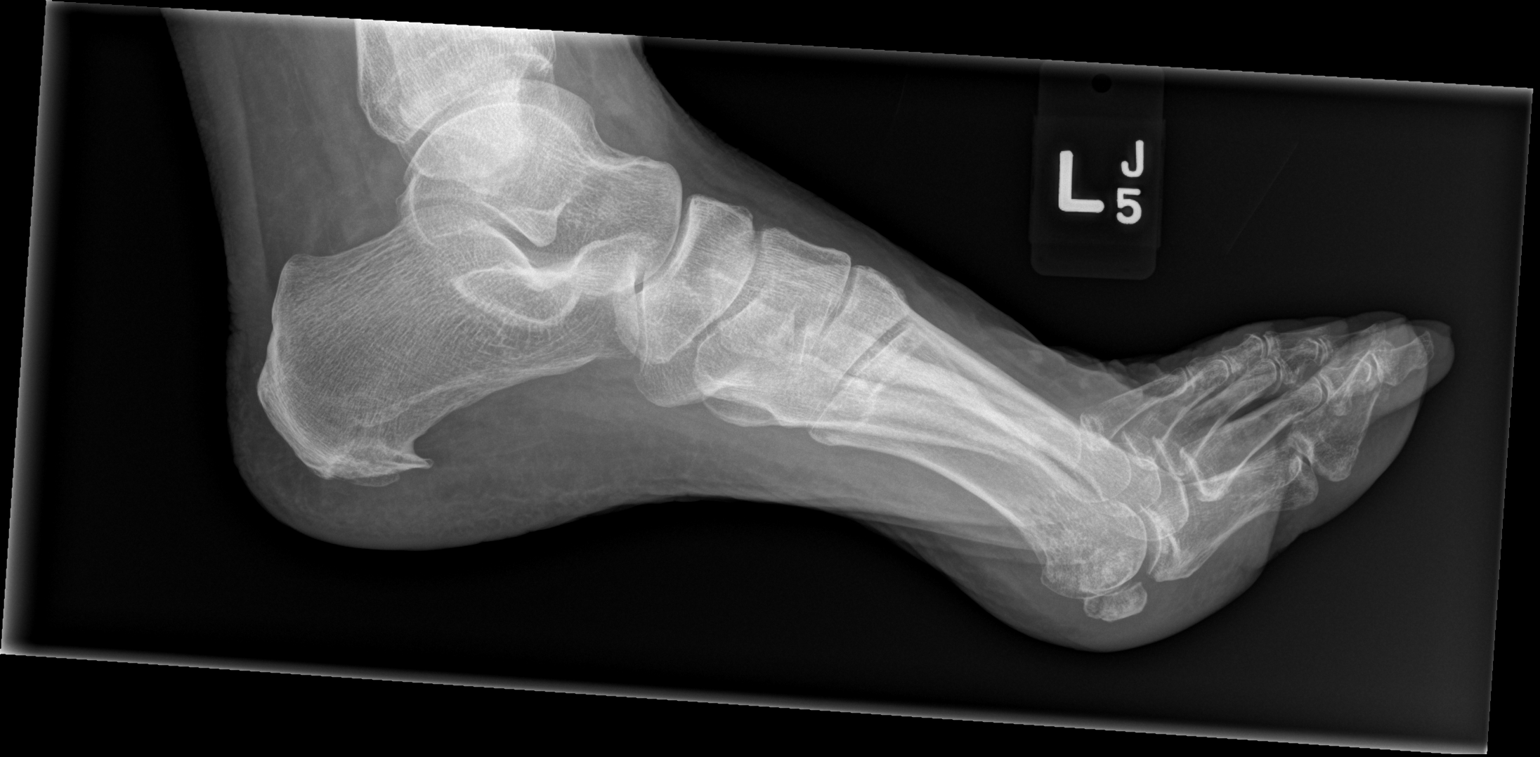

[3 of 3 positions shown; findings below may reference images not displayed]

FINDINGS: Fracture of the distal left fibula as noted on ankle films. No other
fracture or dislocation noted.

Plantar spur.
IMPRESSION: Fracture of the distal left fibula as noted on ankle films. No other
fracture or dislocation noted.

## 2017-09-27 ENCOUNTER — Telehealth: Payer: Self-pay | Admitting: Internal Medicine

## 2017-09-27 NOTE — Telephone Encounter (Signed)
Megan [name] with [facility] ph# [phone] called re: They need certificate of medical necessity with a diagnosis code. Also, they need Medicare compliant chart notes

## 2017-09-27 NOTE — Telephone Encounter (Signed)
Waiting for the fax to come through

## 2017-09-28 NOTE — Progress Notes (Signed)
Subjective:    Patient ID: Megan [name], female    DOB: [birth year], 76 y.o.   MRN: [id number]  HPI The patient is here for follow up.  Hypertension: She is taking her medication daily. She is compliant with a low sodium diet.  She denies chest pain, palpitations and regular headaches. She is not exercising regularly.    Diabetes: She is following with Dr Cruzita Lederer.  She is taking her medication daily as prescribed. She is compliant with a diabetic diet. She is not exercising regularly.  She checks her feet daily and denies foot lesions. She is up-to-date with an ophthalmology examination.   Metastatic lung cancer:  She just saw oncology this month and is under observation.  Her disease is stable.   Neuropathy, numbness/tingling in hands and feet from chemo:  She is taking lyrica and cymbalta.  She would like to switch the management over to me if possible.  The medications do help.  She is having side effects from the lyrica (weight gain, dizziness, SOB) and wants to come off of it.  She was on gabapentin in the past.  She would ideally like to go back on the gabapentin.  She was on both the extended release and short-acting formula.  She went on Lyrica once the gabapentin was not covered by insurance.  Medications and allergies reviewed with patient and updated if appropriate.  Patient Active Problem List   Diagnosis Date Noted  . Cough 08/22/2017  . Primary osteoarthritis of both hands 03/08/2017  . High risk medication use 03/07/2017  . Poor balance 02/07/2017  . Type 2 diabetes mellitus with diabetic polyneuropathy, with long-term current use of insulin ([place]) 01/11/2017  . Subarachnoid bleed ([place]) 01/11/2017  . Psoriatic arthropathy ([place]) 09/08/2016  . Essential hypertension 09/08/2016  . Diabetic neuropathy ([place]) 09/08/2016  . LADA (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), managed as type 1 ([place]) 05/04/2016  . Syncope 03/25/2016  . Malnutrition of moderate degree 02/29/2016    . Diabetic ketoacidosis with coma associated with diabetes mellitus due to underlying condition ([place])   . Abnormal finding on GI tract imaging   . Diarrhea   . Hematochezia   . RLS (restless legs syndrome) 12/11/2015  . Liver dysfunction 08/11/2015  . Lumbago 05/06/2015  . Peripheral edema 04/14/2015  . Psoriasis 04/14/2015  . Encounter for antineoplastic immunotherapy 04/06/2015  . Emphysema of lung ([place]) 11/26/2014  . Radiation-induced esophageal stricture 10/01/2014  . Tachycardia 08/27/2014  . Palliative care encounter 08/21/2014  . DNR (do not resuscitate) discussion 08/21/2014  . Cancer associated pain 07/29/2014  . Malignant cachexia ([place]) 07/29/2014  . Lung collapse 07/29/2014  . Fatigue 07/18/2014  . Neuropathy due to chemotherapeutic drug ([place]) 07/18/2014  . Leg cramps 07/18/2014  . Hypoalbuminemia 07/18/2014  . Thrombocytopenia, unspecified ([place]) 07/18/2014  . Dyspnea 04/28/2014  . Radiation esophagitis 04/10/2014  . Right-sided chest wall pain 04/01/2014  . Cancer of middle lobe of lung ([place]) 01/17/2014  . Malignant neoplasm of right upper lobe of lung ([place]) 11/28/2013    Current Outpatient Medications on File Prior to Visit  Medication Sig Dispense Refill  . amLODipine (NORVASC) 2.5 MG tablet Take 1 tablet (2.5 mg total) by mouth daily. 90 tablet 3  . augmented betamethasone dipropionate (DIPROLENE-AF) 0.05 % ointment APP AA BID PRN  1  . B-D UF III MINI PEN NEEDLES 31G X 5 MM MISC USE TO CHECK SUGAR FIVE TIMES DAILY 100 each 0  . budesonide-formoterol (SYMBICORT) 80-4.5 MCG/ACT inhaler  Inhale 2 puffs 2 (two) times daily into the lungs. 1 Inhaler 6  . Continuous Blood Gluc Receiver (FREESTYLE LIBRE 14 DAY READER) DEVI 1 Device by Does not apply route daily. 1 Device 0  . Continuous Blood Gluc Sensor (FREESTYLE LIBRE 14 DAY SENSOR) MISC Inject 1 Device into the skin every 14 (fourteen) days. 3 each 2  . DULoxetine (CYMBALTA) 60 MG capsule Take 1 capsule (60 mg  total) by mouth 2 (two) times daily. 60 capsule 12  . glucose blood test strip Use as instructed 4x a day - Verio 200 each 5  . guaiFENesin (MUCINEX) 600 MG 12 hr tablet Take 1 tablet (600 mg total) by mouth 2 (two) times daily. 30 tablet 3  . Insulin Detemir (LEVEMIR FLEXTOUCH) 100 UNIT/ML Pen Inject 20 units in the morning, and 25 units in the pm. 15 pen 3  . insulin lispro (HUMALOG KWIKPEN) 100 UNIT/ML KiwkPen ADMINISTER 15 TO 26 UNITS UNDER THE SKIN THREE TIMES DAILY 15 pen 3  . Insulin Pen Needle (B-D UF III MINI PEN NEEDLES) 31G X 5 MM MISC USE TO take insulin FIVE TIMES DAILY 200 each 5  . loratadine (CLARITIN) 10 MG tablet Take 1 tablet (10 mg total) by mouth daily. 30 tablet 3  . omeprazole (PRILOSEC) 20 MG capsule Take 1 capsule (20 mg total) by mouth daily. 30 capsule 11  . ONETOUCH DELICA LANCETS FINE MISC USE TO CHECK BLOOD SUGAR FOUR TIMES DAILY 200 each 5  . sulfaSALAzine (AZULFIDINE) 500 MG EC tablet TAKE 2 TABLETS(1000 MG) BY MOUTH TWICE DAILY 120 tablet 0   No current facility-administered medications on file prior to visit.     Past Medical History:  Diagnosis Date  . Cancer ([place]) dx'd 09/2013   Lung ca  . Diabetes ([place]) 09/08/2016   type I diabetic patient reported on 09/13/16   . Diabetes mellitus    insulin  . Diabetic neuropathy ([place]) 09/08/2016  . GERD (gastroesophageal reflux disease)    no meds for  . History of hiatal hernia   . Hx of radiation therapy 12/16/13-01/30/14   lung 66Gy  . Hypertension   . Malignant neoplasm of right upper lobe of lung ([place]) 11/28/2013  . Neuropathy   . Psoriasis   . Psoriatic arthritis ([place]) 09/08/2016  . Rheumatoid arthritis ([place])   . Shortness of breath dyspnea    with exertion    Past Surgical History:  Procedure Laterality Date  . ABDOMINAL HYSTERECTOMY    . ABDOMINAL SURGERY    . BALLOON DILATION N/A 12/12/2014   Procedure: BALLOON DILATION;  Surgeon: Inda Castle, MD;  Location: [facility];  Service: Endoscopy;   Laterality: N/A;  . CHOLECYSTECTOMY    . COLONOSCOPY N/A 02/07/2016   Procedure: COLONOSCOPY;  Surgeon: Doran Stabler, MD;  Location[facility];  Service: Endoscopy;  Laterality: N/A;  . DILATION AND CURETTAGE OF UTERUS    . ESOPHAGOGASTRODUODENOSCOPY N/A 12/12/2014   Procedure: ESOPHAGOGASTRODUODENOSCOPY (EGD);  Surgeon: Inda Castle, MD;  Location: [facility];  Service: Endoscopy;  Laterality: N/A;  with dilation  . ESOPHAGOGASTRODUODENOSCOPY (EGD) WITH PROPOFOL N/A 10/09/2014   Procedure: ESOPHAGOGASTRODUODENOSCOPY (EGD) WITH PROPOFOL;  Surgeon: Inda Castle, MD;  Location: [facility];  Service: Endoscopy;  Laterality: N/A;  . ESOPHAGOGASTRODUODENOSCOPY (EGD) WITH PROPOFOL N/A 09/19/2016   Procedure: ESOPHAGOGASTRODUODENOSCOPY (EGD) WITH PROPOFOL;  Surgeon: Doran Stabler, MD;  Location: [facility] ENDOSCOPY;  Service: Gastroenterology;  Laterality: N/A;  with savary dil tt  . FRACTURE  SURGERY    . ORIF ANKLE FRACTURE Right 07/04/2015   Procedure: OPEN REDUCTION INTERNAL FIXATION (ORIF) ANKLE FRACTURE;  Surgeon: Newt Minion, MD;  Location: [facility];  Service: Orthopedics;  Laterality: Right;  OPEN REDUCTION, INTERNAL FIXATION OF RIGHT ANKLE FRACTURE.   [name] ORIF ANKLE FRACTURE Left 02/10/2016   Procedure: OPEN REDUCTION INTERNAL FIXATION (ORIF) ANKLE FRACTURE;  Surgeon: Newt Minion, MD;  Location: [facility];  Service: Orthopedics;  Laterality: Left;  . SAVORY DILATION N/A 10/09/2014   Procedure: SAVORY DILATION;  Surgeon: Inda Castle, MD;  Location: [facility];  Service: Endoscopy;  Laterality: N/A;  . SAVORY DILATION N/A 09/19/2016   Procedure: SAVORY DILATION;  Surgeon: Doran Stabler, MD;  Location: [facility] ENDOS[facility];  Service: Gastroenterology;  Laterality: N/A;  . TONSILLECTOMY      Social History   Socioeconomic History  . Marital status: Divorced    Spouse name: Not on file  . Number of children: 0  . Years of education: MA  . Highest education level: Not on file  Social  Needs  . Financial resource strain: Not on file  . Food insecurity - worry: Not on file  . Food insecurity - inability: Not on file  . Transportation needs - medical: Not on file  . Transportation needs - non-medical: Not on file  Occupational History  . Occupation: Retired    Fish farm manager: OTHER  Tobacco Use  . Smoking status: Former Smoker    Packs/day: 3.00    Years: 45.00    Pack years: 135.00    Types: Cigarettes    Last attempt to quit: 12/05/1997    Years since quitting: 19.8  . Smokeless tobacco: Never Used  Substance and Sexual Activity  . Alcohol use: No    Alcohol/week: 0.0 oz    Comment: rarely   . Drug use: No  . Sexual activity: Not Currently  Other Topics Concern  . Not on file  Social History Narrative   Patient lives at home alone.   Caffeine Use: 2 cups daily    Family History  Problem Relation Age of Onset  . Other Mother   . Other Father        failure to thrive  . Hypertension Other   . Stroke Other   . Heart attack Other   . Hemachromatosis Other   . Rheum arthritis Unknown        both sets of grandparents and father    Review of Systems  Constitutional: Negative for fatigue.  Respiratory: Positive for cough and shortness of breath. Negative for wheezing.   Cardiovascular: Positive for leg swelling. Negative for chest pain and palpitations.  Neurological: Negative for light-headedness and headaches.       Objective:   Vitals:   09/29/17 1558  BP: 126/90  Pulse: 100  Resp: 16  Temp: 97.8 F (36.6 C)  SpO2: 95%   Wt Readings from Last 3 Encounters:  09/29/17 197 lb (89.4 kg)  09/13/17 195 lb 11.2 oz (88.8 kg)  09/08/17 195 lb (88.5 kg)   Body mass index is 31.8 kg/m.   Physical Exam    Constitutional: Appears well-developed and well-nourished. No distress.  HENT:  Head: Normocephalic and atraumatic.  Neck: Neck supple. No tracheal deviation present. No thyromegaly present.  No cervical lymphadenopathy Cardiovascular: Normal  rate, regular rhythm and normal heart sounds.   No murmur heard. No carotid bruit .  1+ bilateral lower extremity edema Pulmonary/Chest: Effort normal and breath sounds normal.  No respiratory distress. No has no wheezes. No rales.  Skin: Skin is warm and dry. Not diaphoretic.  Psychiatric: Normal mood and affect. Behavior is normal.      Assessment & Plan:    See Problem List for Assessment and Plan of chronic medical problems.

## 2017-09-28 NOTE — Patient Instructions (Addendum)
Take  75 mg of the lyrica twice a day for two weeks.  Decrease to 50 mg twice daily for one week. Then 25 mg twice daily for one week. Then stop.   No immunization administered today.     Your prescription(s) have been submitted to your pharmacy. Please take as directed and contact our office if you believe you are having problem(s) with the medication(s).   Please followup in 6 months

## 2017-09-29 ENCOUNTER — Ambulatory Visit: Payer: Medicare Other | Admitting: Internal Medicine

## 2017-09-29 VITALS — BP 126/90 | HR 100 | Temp 97.8°F | Resp 16 | Wt 197.0 lb

## 2017-09-29 DIAGNOSIS — E1142 Type 2 diabetes mellitus with diabetic polyneuropathy: Secondary | ICD-10-CM

## 2017-09-29 DIAGNOSIS — G620 Drug-induced polyneuropathy: Secondary | ICD-10-CM | POA: Diagnosis not present

## 2017-09-29 DIAGNOSIS — Z794 Long term (current) use of insulin: Secondary | ICD-10-CM

## 2017-09-29 DIAGNOSIS — T451X5A Adverse effect of antineoplastic and immunosuppressive drugs, initial encounter: Secondary | ICD-10-CM | POA: Diagnosis not present

## 2017-09-29 DIAGNOSIS — I10 Essential (primary) hypertension: Secondary | ICD-10-CM | POA: Diagnosis not present

## 2017-09-29 MED ORDER — PREGABALIN 25 MG PO CAPS: 75.0000 mg | ORAL_CAPSULE | Freq: Two times a day (BID) | ORAL | 0 refills | Status: DC

## 2017-09-30 ENCOUNTER — Encounter: Payer: Self-pay | Admitting: Internal Medicine

## 2017-09-30 NOTE — Assessment & Plan Note (Signed)
Management per endocrine  Lab Results  Component Value Date   HGBA1C 7.3 05/18/2017   Overall sugars well controlled

## 2017-09-30 NOTE — Assessment & Plan Note (Signed)
I will prescribe her medications Neuropathy secondary to chemotherapy Taper off Lyrica-decrease to 75 mg twice daily and then taper as instructed When she is off the Lyrica will restart gabapentin Continue Cymbalta

## 2017-09-30 NOTE — Assessment & Plan Note (Signed)
Blood pressure adequately controlled Continue current medication at current doses

## 2017-10-02 ENCOUNTER — Telehealth: Payer: Self-pay | Admitting: Internal Medicine

## 2017-10-02 NOTE — Telephone Encounter (Signed)
[facility] needs certificate of necessity (need ICD10 Code) and chart notes faxed to them fax# [phone]

## 2017-10-02 NOTE — Telephone Encounter (Signed)
Will send over pertinent information to them when I get the fax from their office.

## 2017-10-11 ENCOUNTER — Telehealth: Payer: Self-pay | Admitting: Internal Medicine

## 2017-10-11 ENCOUNTER — Other Ambulatory Visit: Payer: Self-pay | Admitting: Internal Medicine

## 2017-10-11 MED ORDER — GABAPENTIN 300 MG PO CAPS: 300.0000 mg | ORAL_CAPSULE | Freq: Two times a day (BID) | ORAL | 3 refills | Status: DC

## 2017-10-11 NOTE — Telephone Encounter (Signed)
rx sent for gabapentin 300 mg bid  -- will adjust medication dose as needed.

## 2017-10-11 NOTE — Telephone Encounter (Signed)
Copied from [place] [phone]. Topic: General - Other >> Oct 11, 2017  9:22 AM Yvette Rack wrote: Reason for CRM: patient was calling to see if Dr Quay Burow had found out if her insurance will cover Gabapentin she hasn't heard from anyone about the medicine please call pt at [phone] patient would like to use [id number] - [place], [place] - [address] AT [address] [phone] (Phone)

## 2017-10-11 NOTE — Telephone Encounter (Signed)
Spoke with pt and advised that we do not know if the med is covered until we send it to the pharmacy. Please sent RX to pharmacy and I will complete a PA if needed.

## 2017-10-11 NOTE — Telephone Encounter (Signed)
Do you know anything about this? 

## 2017-10-12 ENCOUNTER — Ambulatory Visit: Payer: Medicare Other

## 2017-10-12 ENCOUNTER — Telehealth: Payer: Self-pay | Admitting: Internal Medicine

## 2017-10-12 NOTE — Telephone Encounter (Signed)
Called and left message, we need a form faxed to [place] stating this in order to fax the needed information

## 2017-10-12 NOTE — Telephone Encounter (Signed)
[facility] called re: request for certificate of medical necessity needing to be faxed back to them along with patient chart notes. Fax# [phone]

## 2017-10-17 ENCOUNTER — Ambulatory Visit: Payer: Medicare Other | Admitting: Neurology

## 2017-10-17 ENCOUNTER — Ambulatory Visit: Payer: Medicare Other | Admitting: Internal Medicine

## 2017-10-17 ENCOUNTER — Encounter: Payer: Self-pay | Admitting: Neurology

## 2017-10-17 ENCOUNTER — Encounter: Payer: Self-pay | Admitting: Internal Medicine

## 2017-10-17 VITALS — BP 135/92 | HR 108 | Ht 66.0 in | Wt 196.8 lb

## 2017-10-17 VITALS — BP 150/92 | HR 107 | Ht 66.0 in | Wt 196.0 lb

## 2017-10-17 DIAGNOSIS — G5602 Carpal tunnel syndrome, left upper limb: Secondary | ICD-10-CM | POA: Diagnosis not present

## 2017-10-17 DIAGNOSIS — R0600 Dyspnea, unspecified: Secondary | ICD-10-CM | POA: Diagnosis not present

## 2017-10-17 DIAGNOSIS — J439 Emphysema, unspecified: Secondary | ICD-10-CM | POA: Diagnosis not present

## 2017-10-17 DIAGNOSIS — R061 Stridor: Secondary | ICD-10-CM

## 2017-10-17 NOTE — Progress Notes (Signed)
Subjective:     Patient ID: Megan [name], female   DOB: November 17, 1940, 75 y.o.   MRN: [id number]  HPI    OV 09/05/2017  Chief Complaint  Patient presents with  . Follow-up    Pt states that she has been doing good except that she has had some chest congestion and a cough with mild SOB.    76 year old female stage IIIB lung cancer was survive the odds and has cancer in complete remission. Currently on expectant follow-up with Dr. Julien Nordmann. She has mild emphysema based on based onreduction in diffusion capacity in January 2015 and CT scan findings.Her course was   complicated by diabetes and neuropathy and depression and weight loss and fatigue all due to chemotherapy. Currently she is on observation therapy. I'm not seen her personally in a while. She returns now because of ongoing cough and congestion and nocturnal [name] the last 1 year. There is no sputum production. She saw my colleague Dr. Lamonte Sakai a few weeks ago and was given Claritin which is helped partially but only a little bit. Symptoms still persist. She is not taking her Symbicort anymore. She does not even remember me prescribing this. FeNO 18ppb 09/05/2017 . [name] She's had significant weight gain since I last saw her . RSI cough score 21 and c/w LPR cough  OV 10/17/2017  Chief Complaint  Patient presents with  . Follow-up    Pt states she has been doing pretty good since last visit. States that she is still coughing since last visit but is some less. Also states that she becomes SOB with exertion. Denies any CP.    Follow-up cough  Last visit on basis of emphysema was started on Symbicort.  She tells me that the cough might be better but she did not fully show up.  Definitely she is not worse.  RSI cough score is the same at 21.  In addition she is complaining of some dyspnea.  This dyspnea is there on exertion but also when she talks.  She has a stridorous noise at the end of inspiration at rest and she tells me that it has been  like this for 3 years.  She has tried some Mucinex without much success.  But there is no symptoms of COPD exacerbation.  Again it is not fully clear if she is compliant with her Symbicort.  Most recently she had CT scan of the chest that shows no evidence of cancer recurrence but there is endobronchial compression of her main bronchial tubes.  She denies any intubation or radiation treatment to the neck but she did get radiation to the upper chest.  She is not very interested and stent replacement or evaluation by interventional pulmonary.  She is open to having a ENT evaluation to evaluate her neck  Dr Lorenza Cambridge Reflux Symptom Index (> 13-15 suggestive of LPR cough)  09/05/2017  10/17/2017   Hoarseness of problem with voice 3 3  Clearing  Of Throat 4 3  Excess throat mucus or feeling of post nasal drip 1 3  Difficulty swallowing food, liquid or tablets 1 3  Cough after eating or lying down 3 3  Breathing difficulties or choking episodes 1 1  Troublesome or annoying cough 4 2  Sensation of something sticking in throat or lump in throat 0 0  Heartburn, chest pain, indigestion, or stomach acid coming up 4 3  TOTAL 21 21    IMPRESSION: CT chest 1. Resolved tiny right pleural effusion compared to  05/17/2017. 2. Otherwise, similar appearance of the chest. Right middle and upper lobe collapse secondary to chronic endobronchial compression, treatment effects. 3. Coronary artery atherosclerosis. Aortic Atherosclerosis (ICD10-I70.0). 4.  Emphysema (ICD10-J43.9). 5. Improved esophageal wall thickening, likely representing radiation induced esophagitis. 6. Hepatic steatosis.   Electronically Signed   By: Abigail Miyamoto M.D.   On: 09/08/2017 16:04    has a past medical history of Cancer ([place]) (dx'd 09/2013), Concussion (2018), Diabetes ([place]) (09/08/2016), Diabetes mellitus, Diabetic neuropathy ([place]) (09/08/2016), Fall (2018), GERD (gastroesophageal reflux disease), History of colitis, History  of hiatal hernia, radiation therapy (12/16/13-01/30/14), Hypertension, Malignant neoplasm of right upper lobe of lung ([place]) (11/28/2013), Neuropathy, Psoriasis, Psoriatic arthritis ([place]) (09/08/2016), Rheumatoid arthritis ([place]), and Shortness of breath dyspnea.   reports that she quit smoking about 19 years ago. Her smoking use included cigarettes. She has a 135.00 pack-year smoking history. she has never used smokeless tobacco.  Past Surgical History:  Procedure Laterality Date  . ABDOMINAL HYSTERECTOMY    . ABDOMINAL SURGERY    . BALLOON DILATION N/A 12/12/2014   Procedure: BALLOON DILATION;  Surgeon: Inda Castle, MD;  Location: [facility];  Service: Endoscopy;  Laterality: N/A;  . CHOLECYSTECTOMY    . COLONOSCOPY N/A 02/07/2016   Procedure: COLONOSCOPY;  Surgeon: Doran Stabler, MD;  Location: [facility];  Service: Endoscopy;  Laterality: N/A;  . DILATION AND CURETTAGE OF UTERUS    . ESOPHAGOGASTRODUODENOSCOPY N/A 12/12/2014   Procedure: ESOPHAGOGASTRODUODENOSCOPY (EGD);  Surgeon: Inda Castle, MD;  Location: [facility];  Service: Endoscopy;  Laterality: N/A;  with dilation  . ESOPHAGOGASTRODUODENOSCOPY (EGD) WITH PROPOFOL N/A 10/09/2014   Procedure: ESOPHAGOGASTRODUODENOSCOPY (EGD) WITH PROPOFOL;  Surgeon: Inda Castle, MD;  Location: [place];  Service: Endoscopy;  Laterality: N/A;  . ESOPHAGOGASTRODUODENOSCOPY (EGD) WITH PROPOFOL N/A 09/19/2016   Procedure: ESOPHAGOGASTRODUODENOSCOPY (EGD) WITH PROPOFOL;  Surgeon: Doran Stabler, MD;  Location: [facility];  Service: Gastroenterology;  Laterality: N/A;  with savary dil tt  . FRACTURE SURGERY    . ORIF ANKLE FRACTURE Right 07/04/2015   Procedure: OPEN REDUCTION INTERNAL FIXATION (ORIF) ANKLE FRACTURE;  Surgeon: Newt Minion, MD;  Location: [facility];  Service: Orthopedics;  Laterality: Right;  OPEN REDUCTION, INTERNAL FIXATION OF RIGHT ANKLE FRACTURE.   [name] ORIF ANKLE FRACTURE Left 02/10/2016   Procedure: OPEN REDUCTION  INTERNAL FIXATION (ORIF) ANKLE FRACTURE;  Surgeon: Newt Minion, MD;  Location: [facility];  Service: Orthopedics;  Laterality: Left;  . SAVORY DILATION N/A 10/09/2014   Procedure: SAVORY DILATION;  Surgeon: Inda Castle, MD;  Location: [place];  Service: Endoscopy;  Laterality: N/A;  . SAVORY DILATION N/A 09/19/2016   Procedure: SAVORY DILATION;  Surgeon: Doran Stabler, MD;  Location: [facility];  Service: Gastroenterology;  Laterality: N/A;  . TONSILLECTOMY      Allergies  Allergen Reactions  . Carboplatin Other (See Comments)    Neuropathy  . Remicade [Infliximab] Anaphylaxis  . Ciprofloxacin Rash  . Hydromorphone Rash    Immunization History  Administered Date(s) Administered  . Influenza, High Dose Seasonal PF 08/12/2016, 08/30/2017  . Influenza,inj,Quad PF,6+ Mos 11/07/2013, 07/14/2014, 08/25/2015  . PPD Test 03/02/2016  . Pneumococcal-Unspecified 12/02/2015  . Tdap 01/11/2017    Family History  Problem Relation Age of Onset  . Other Mother   . Other Father        failure to thrive  . Hypertension Other   . Stroke Other   . Heart attack Other   .  Hemachromatosis Other   . Rheum arthritis Unknown        both sets of grandparents and father     Current Outpatient Medications:  .  amLODipine (NORVASC) 2.5 MG tablet, Take 1 tablet (2.5 mg total) by mouth daily., Disp: 90 tablet, Rfl: 3 .  augmented betamethasone dipropionate (DIPROLENE-AF) 0.05 % ointment, APP AA BID PRN, Disp: , Rfl: 1 .  B-D UF III MINI PEN NEEDLES 31G X 5 MM MISC, USE TO CHECK SUGAR FIVE TIMES DAILY, Disp: 100 each, Rfl: 0 .  budesonide-formoterol (SYMBICORT) 80-4.5 MCG/ACT inhaler, Inhale 2 puffs 2 (two) times daily into the lungs., Disp: 1 Inhaler, Rfl: 6 .  Continuous Blood Gluc Receiver (FREESTYLE LIBRE 14 DAY READER) DEVI, 1 Device by Does not apply route daily., Disp: 1 Device, Rfl: 0 .  Continuous Blood Gluc Sensor (FREESTYLE LIBRE 14 DAY SENSOR) MISC, Inject 1 Device into the skin  every 14 (fourteen) days., Disp: 3 each, Rfl: 2 .  DULoxetine (CYMBALTA) 60 MG capsule, Take 1 capsule (60 mg total) by mouth 2 (two) times daily., Disp: 60 capsule, Rfl: 12 .  gabapentin (NEURONTIN) 300 MG capsule, Take 1 capsule (300 mg total) by mouth 2 (two) times daily., Disp: 60 capsule, Rfl: 3 .  glucose blood test strip, Use as instructed 4x a day - Verio, Disp: 200 each, Rfl: 5 .  guaiFENesin (MUCINEX) 600 MG 12 hr tablet, Take 1 tablet (600 mg total) by mouth 2 (two) times daily., Disp: 30 tablet, Rfl: 3 .  Insulin Detemir (LEVEMIR FLEXTOUCH) 100 UNIT/ML Pen, Inject 20 units in the morning, and 25 units in the pm., Disp: 15 pen, Rfl: 3 .  insulin lispro (HUMALOG KWIKPEN) 100 UNIT/ML KiwkPen, ADMINISTER 15 TO 26 UNITS UNDER THE SKIN THREE TIMES DAILY, Disp: 15 pen, Rfl: 3 .  Insulin Pen Needle (B-D UF III MINI PEN NEEDLES) 31G X 5 MM MISC, USE TO take insulin FIVE TIMES DAILY, Disp: 200 each, Rfl: 5 .  loratadine (CLARITIN) 10 MG tablet, Take 1 tablet (10 mg total) by mouth daily., Disp: 30 tablet, Rfl: 3 .  omeprazole (PRILOSEC) 20 MG capsule, Take 1 capsule (20 mg total) by mouth daily., Disp: 30 capsule, Rfl: 11 .  ONETOUCH DELICA LANCETS FINE MISC, USE TO CHECK BLOOD SUGAR FOUR TIMES DAILY, Disp: 200 each, Rfl: 5 .  ONETOUCH DELICA LANCETS FINE MISC, TEST BLOOD SUGAR FIVE TIMES DAILY AS NEEDED, Disp: 200 each, Rfl: 0 .  pregabalin (LYRICA) 25 MG capsule, Take 3 capsules (75 mg total) by mouth 2 (two) times daily., Disp: 180 capsule, Rfl: 0 .  sulfaSALAzine (AZULFIDINE) 500 MG EC tablet, TAKE 2 TABLETS(1000 MG) BY MOUTH TWICE DAILY, Disp: 120 tablet, Rfl: 0   Review of Systems     Objective:   Physical Exam  Constitutional: She is oriented to person, place, and time. She appears well-developed and well-nourished. No distress.  HENT:  Head: Normocephalic and atraumatic.  Right Ear: External ear normal.  Left Ear: External ear normal.  Mouth/Throat: Oropharynx is clear and moist.  No oropharyngeal exudate.  Eyes: Conjunctivae and EOM are normal. Pupils are equal, round, and reactive to light. Right eye exhibits no discharge. Left eye exhibits no discharge. No scleral icterus.  Neck: Normal range of motion. Neck supple. No JVD present. No tracheal deviation present. No thyromegaly present.  Cardiovascular: Normal rate, regular rhythm, normal heart sounds and intact distal pulses. Exam reveals no gallop and no friction rub.  No murmur heard. Pulmonary/Chest: Effort  normal and breath sounds normal. No respiratory distress. She has no wheezes. She has no rales. She exhibits no tenderness.  Lung clear But when she talks during inspriatioin there is a stridorous noise -s ays been like this for 3year  Abdominal: Soft. Bowel sounds are normal. She exhibits no distension and no mass. There is no tenderness. There is no rebound and no guarding.  Musculoskeletal: Normal range of motion. She exhibits no edema or tenderness.  Lymphadenopathy:    She has no cervical adenopathy.  Neurological: She is alert and oriented to person, place, and time. She has normal reflexes. No cranial nerve deficit. She exhibits normal muscle tone. Coordination normal.  Skin: Skin is warm and dry. No rash noted. She is not diaphoretic. No erythema. No pallor.  Psychiatric: She has a normal mood and affect. Her behavior is normal. Judgment and thought content normal.  Vitals reviewed.    Vitals:   10/17/17 1607  BP: (!) 150/92  Pulse: (!) 107  SpO2: 97%  Weight: 196 lb (88.9 kg)  Height: 5\' 6"  (1.676 m)    Estimated body mass index is 31.64 kg/m as calculated from the following:   Height as of this encounter: 5\' 6"  (1.676 m).   Weight as of this encounter: 196 lb (88.9 kg).      Assessment:       ICD-10-CM   1. Pulmonary emphysema, unspecified emphysema type ([place]) J43.9   2. Dyspnea, unspecified type R06.00   3. Inspiratory stridor R06.1        Plan:     Pulmonary emphysema,  unspecified emphysema type ([place])  - continue symbicort daily - can try Mucinex as needed    Dyspnea, unspecified type Inspiratory stridor  - refer and  see ENT to see if there is any negative effective of upper chest radiation to your neck  - if this is negative, we can discuss evaluation by [facility] interventional pulmonary but currently agree you want to hold off   Followup Any worsening of cough , please call us and we can try a short prednisone burst 2 months but after seeing ENT  (> 50% of this 15 min visit spent in face to face counseling or/and coordination of care)   Dr. Brand Males, M.D., [facility].C.P Pulmonary and Critical Care Medicine Staff Physician, [place] Director - Interstitial Lung Disease  Program  P[facility] [place] at [place], [place], [address]  Pager: [phone], If no answer or between  15:00h - 7:00h: call [phone] Telephone: [phone]

## 2017-10-17 NOTE — Addendum Note (Signed)
Addended by: Lorretta Harp on: 10/17/2017 04:52 PM   Modules accepted: Orders

## 2017-10-17 NOTE — Patient Instructions (Addendum)
Pulmonary emphysema, unspecified emphysema type ([place])  - continue symbicort daily - can try Mucinex as needed    Dyspnea, unspecified type Inspiratory stridor  - refer and  see ENT to see if there is any negative effective of upper chest radiation to your neck  - if this is negative, we can discuss evaluation by [facility] interventional pulmonary but currently agree you want to hold off   Followup Any worsening of cough , please call us and we can try a short prednisone burst 2 months but after seeing ENT

## 2017-10-17 NOTE — Progress Notes (Signed)
[facility]    Provider:  Dr Jaynee Eagles Referring Provider: Binnie Rail, MD Primary Care Physician:  Binnie Rail, MD   HPI: This is a wonderful 76 years old white female with history of stage IIIB non-small cell lung cancer status postcarboplatin and paclitaxel with resultant painful neuropathy. She is still on the Lyrica. Her left hand feels "sandy", all the fingers, feels like sand paper, she feels weakness in her left hand and is dropping, she has APB muscle wasting. Feet are stable, numb, no significant pain. New issue is the left hand symptoms.  Interval history 10/26/2016: She is here for painful neuropathy. Her feet are numb. Not much pain in the feet anymore. She has pain in the hands. She can grip things and has some strength but the left hand is worse than the right. She has done well on the medications but today would like to try Lyrica instead of long-acting gabapentin. She is on 1200-1800mg  of long-acting gabapentin so will start her at a higher dose of Lyrica 150mg  twice daily. Discussed side effects.    Interval History 08/12/2015:She has fallen and broken several bones in the right leg. She in a cast currently. She had to go to skilled nursing for several weeks. Her legs just buckled and she fell. It was late at night, she got up to go to the bathroom before she went to sleep. She got lightheaded and she crashed onher ankle. She crawled over to the phone and called 911 and went to the emergency room. She was Dxed with low blood pressure. At a later incident, she was walking around the bed and just went down, no LOC, no SOB, no CP. Not lightheaded. Her legs just buckled, both of them, did not trip. No weakness, she can get out of low seats, she can walk up stairs, no new weakness. She was very active all week, no illnesses or inciting events. Howvere she has had diarrhea for 2 months and she is dehydrated. She was placed on prednisone yesterday, cdiff was  negative. She was not taking marinol. She was no taking vicadin or any pain killer at the time. Cymbalta is helping. Her left hand is getting number. She is not in pain with the neuropathy, more numbness. Before she gets up, she puts her head down to help with blood pressure. They decreased her BP medication, then stopped it entirely. Her glucose has been running very high, 8 days ago 542 and 1 day ago was 210 and she is following with her pcp.   Interval history 06/18/2015:4pm Tuesday feet started burning significantly, like on fire on the bootm. This was new. The left hand also started to burn. She has never had it before. The burning is better. It was very difficult to walk. She took ibuprofen and it helped. The burning went away in 20 minutes. The left more than the right. She had shoes on, no new shoes. She was reclining on her bed watching TV. Excruciating on the left. Does not feel the Lamictal is helping with the neuropathy or with mood.  Interval History 05/07/2015: This is a wonderful 76 years old white female with history of stage IIIB non-small cell lung cancer status postcarboplatin and paclitaxel with resultant painful neuropathy.The feet are about the same. Still on the Gralise. Lidocaine patches help somewhat. She is tolerating the Lamictal, no rashes. Discussed several options for her neuropathy, decided to slowly increase Lamictal to 50mg  then 75mg  and then 100mg . This will hopefully  help with her neuropathy and is a great medication for mood. Discussed at length. Ambien ishelping her sleep. Tried Lyrica before the Gralise and that did not work. The Gralise has worked the best so far.   Interval History 11/13/2014: She is feeling better. She is taking the marinol and can sleep at night. The pain is manageable during the day with the Gralise. She has a new diagnosis of diabetes. Her thyroid function is elevated. She takes daily B12. She is having some swelling in her legs which is  manageable however it makes it difficult to drive. She still has tingling in the fingers.   Her doctors include  [facility].  Dr Hipolito Bayley Dr. Wilson Singer  Interval History 09/03/2014:She is still struggling with her peripheral neuropathy, nothing is helping. It is severe. Maybe the Gralise helped but then she ran out and didn't call for a prescription. She was recently given prednisone, cymbalta. Cymbalta made her symptoms worse. Started the Gralise and the 1800 mg worked. Belsomra didn't help. Ambien helps her sleep. Lyrica didn't work either for her peripheral neuropathy. Takes b12 daily and biotin. She is sleeping better with Ambien but the neuropathy is still severe. She can't fall asleep, possibly stress. Paresthesias are in the feet and hands. They occurred in the setting of platin-based chemotherapy. They are continuous, worse at night when sleeping. She is also very emotional, crying a lot. Her appetite is very poor.  Visit 08/13/14:This is a very pleasant 76 years old white female with history of stage IIIB non-small cell lung cancer status postcarboplatin and paclitaxel. Patient reports she had chemotherapy and radiation from [place] - April 2nd. 6 weeks later started a second set of chemo, 3 sessions every 3 weeks. The neuropathy started about 3 weeks ago. Feels like burning, pins and needles and lightning strikes. Feet and left hand >right hand. It is in the entire feet to the ankles and entire left hand and digits 3-5 right hand. Severe, cramping in the feet and calfs. She is crying in the office today. Oxycodone not helping with the pain, doesn't even touch it. Didn't try Lyrica. Cymbalta worsened the symptoms. Nothing is helping. She is using a cane. No falls. Balance if off. The pain is severe, 10/10 and keeps her from sleeping at night. Symptoms are continuous and never subside, but are worse at night.  Reviewed notes, labs and imaging from outside  physicians, which showed: 1) Concurrent chemoradiation with weekly chemotherapy in the form of carboplatin for AUC of 2 and paclitaxel 45 mg/m2. Status post 7 week of treatment. Last dose was given 01/27/2014 no significant response in her disease.  2) Consolidation chemotherapy with carboplatin for AUC of 5 and paclitaxel 175 mg/M2 every 3 weeks with Neulasta support. First dose on 04/15/2014. Status post 3 cycles. Carboplatin was discontinued starting cycle #2 secondary to hypersensitivity reaction.  Her restaging scan showed evidence for disease progression and the patient is currently undergoing immunotherapy with Nivolumab 3 mg/KG every 2 weeks is status post 3 cycle   Review of Systems: Patient complains of symptoms per HPI as well as the following symptoms: left hand numbness and tingling. Pertinent negatives and positives per HPI. All others negative.   Social History   Socioeconomic History  . Marital status: Divorced    Spouse name: Not on file  . Number of children: 0  . Years of education: MA  . Highest education level: Master's degree (e.g., MA, MS, MEng, MEd, MSW, MBA)  Social  Needs  . Financial resource strain: Not on file  . Food insecurity - worry: Not on file  . Food insecurity - inability: Not on file  . Transportation needs - medical: Not on file  . Transportation needs - non-medical: Not on file  Occupational History  . Occupation: Retired    Fish farm manager: OTHER  Tobacco Use  . Smoking status: Former Smoker    Packs/day: 3.00    Years: 45.00    Pack years: 135.00    Types: Cigarettes    Last attempt to quit: 12/05/1997    Years since quitting: 19.8  . Smokeless tobacco: Never Used  Substance and Sexual Activity  . Alcohol use: No    Alcohol/week: 0.0 oz    Comment: rarely   . Drug use: No  . Sexual activity: Not Currently  Other Topics Concern  . Not on file  Social History Narrative   Patient lives at home alone.   Caffeine Use: 1 cups daily   Right  handed    Family History  Problem Relation Age of Onset  . Other Mother   . Other Father        failure to thrive  . Hypertension Other   . Stroke Other   . Heart attack Other   . Hemachromatosis Other   . Rheum arthritis Unknown        both sets of grandparents and father    Past Medical History:  Diagnosis Date  . Cancer ([place]) dx'd 09/2013   Lung ca  . Concussion 2018  . Diabetes ([place]) 09/08/2016   type I diabetic patient reported on 09/13/16   . Diabetes mellitus    insulin  . Diabetic neuropathy ([place]) 09/08/2016  . Fall 2018  . GERD (gastroesophageal reflux disease)    no meds for  . History of colitis   . History of hiatal hernia   . Hx of radiation therapy 12/16/13-01/30/14   lung 66Gy  . Hypertension   . Malignant neoplasm of right upper lobe of lung ([place]) 11/28/2013  . Neuropathy   . Psoriasis   . Psoriatic arthritis ([place]) 09/08/2016  . Rheumatoid arthritis ([place])   . Shortness of breath dyspnea    with exertion    Past Surgical History:  Procedure Laterality Date  . ABDOMINAL HYSTERECTOMY    . ABDOMINAL SURGERY    . BALLOON DILATION N/A 12/12/2014   Procedure: BALLOON DILATION;  Surgeon: Inda Castle, MD;  Location: [facility];  Service: Endoscopy;  Laterality: N/A;  . CHOLECYSTECTOMY    . COLONOSCOPY N/A 02/07/2016   Procedure: COLONOSCOPY;  Surgeon: Doran Stabler, MD;  Location: [facility];  Service: Endoscopy;  Laterality: N/A;  . DILATION AND CURETTAGE OF UTERUS    . ESOPHAGOGASTRODUODENOSCOPY N/A 12/12/2014   Procedure: ESOPHAGOGASTRODUODENOSCOPY (EGD);  Surgeon: Inda Castle, MD;  Location: [facility];  Service: Endoscopy;  Laterality: N/A;  with dilation  . ESOPHAGOGASTRODUODENOSCOPY (EGD) WITH PROPOFOL N/A 10/09/2014   Procedure: ESOPHAGOGASTRODUODENOSCOPY (EGD) WITH PROPOFOL;  Surgeon: Inda Castle, MD;  Location: [facility];  Service: Endoscopy;  Laterality: N/A;  . ESOPHAGOGASTRODUODENOSCOPY (EGD) WITH PROPOFOL N/A 09/19/2016    Procedure: ESOPHAGOGASTRODUODENOSCOPY (EGD) WITH PROPOFOL;  Surgeon: Doran Stabler, MD;  Location: [facility] ENDOSCOPY;  Service: Gastroenterology;  Laterality: N/A;  with savary dil tt  . FRACTURE SURGERY    . ORIF ANKLE FRACTURE Right 07/04/2015   Procedure: OPEN REDUCTION INTERNAL FIXATION (ORIF) ANKLE FRACTURE;  Surgeon: Newt Minion, MD;  [facility]  OR;  Service: Orthopedics;  Laterality: Right;  OPEN REDUCTION, INTERNAL FIXATION OF RIGHT ANKLE FRACTURE.   [name] ORIF ANKLE FRACTURE Left 02/10/2016   Procedure: OPEN REDUCTION INTERNAL FIXATION (ORIF) ANKLE FRACTURE;  Surgeon: Newt Minion, MD;  Location: [facility];  Service: Orthopedics;  Laterality: Left;  . SAVORY DILATION N/A 10/09/2014   Procedure: SAVORY DILATION;  Surgeon: Inda Castle, MD;  Location: [facility];  Service: Endoscopy;  Laterality: N/A;  . SAVORY DILATION N/A 09/19/2016   Procedure: SAVORY DILATION;  Surgeon: Doran Stabler, MD;  Location: [facility] ENDOSCOPY;  Service: Gastroenterology;  Laterality: N/A;  . TONSILLECTOMY      Current Outpatient Medications  Medication Sig Dispense Refill  . amLODipine (NORVASC) 2.5 MG tablet Take 1 tablet (2.5 mg total) by mouth daily. 90 tablet 3  . augmented betamethasone dipropionate (DIPROLENE-AF) 0.05 % ointment APP AA BID PRN  1  . B-D UF III MINI PEN NEEDLES 31G X 5 MM MISC USE TO CHECK SUGAR FIVE TIMES DAILY 100 each 0  . budesonide-formoterol (SYMBICORT) 80-4.5 MCG/ACT inhaler Inhale 2 puffs 2 (two) times daily into the lungs. 1 Inhaler 6  . Continuous Blood Gluc Receiver (FREESTYLE LIBRE 14 DAY READER) DEVI 1 Device by Does not apply route daily. 1 Device 0  . Continuous Blood Gluc Sensor (FREESTYLE LIBRE 14 DAY SENSOR) MISC Inject 1 Device into the skin every 14 (fourteen) days. 3 each 2  . DULoxetine (CYMBALTA) 60 MG capsule Take 1 capsule (60 mg total) by mouth 2 (two) times daily. 60 capsule 12  . gabapentin (NEURONTIN) 300 MG capsule Take 1 capsule (300 mg total) by mouth 2 (two)  times daily. 60 capsule 3  . glucose blood test strip Use as instructed 4x a day - Verio 200 each 5  . guaiFENesin (MUCINEX) 600 MG 12 hr tablet Take 1 tablet (600 mg total) by mouth 2 (two) times daily. 30 tablet 3  . Insulin Detemir (LEVEMIR FLEXTOUCH) 100 UNIT/ML Pen Inject 20 units in the morning, and 25 units in the pm. 15 pen 3  . insulin lispro (HUMALOG KWIKPEN) 100 UNIT/ML KiwkPen ADMINISTER 15 TO 26 UNITS UNDER THE SKIN THREE TIMES DAILY 15 pen 3  . Insulin Pen Needle (B-D UF III MINI PEN NEEDLES) 31G X 5 MM MISC USE TO take insulin FIVE TIMES DAILY 200 each 5  . loratadine (CLARITIN) 10 MG tablet Take 1 tablet (10 mg total) by mouth daily. 30 tablet 3  . omeprazole (PRILOSEC) 20 MG capsule Take 1 capsule (20 mg total) by mouth daily. 30 capsule 11  . ONETOUCH DELICA LANCETS FINE MISC USE TO CHECK BLOOD SUGAR FOUR TIMES DAILY 200 each 5  . ONETOUCH DELICA LANCETS FINE MISC TEST BLOOD SUGAR FIVE TIMES DAILY AS NEEDED 200 each 0  . pregabalin (LYRICA) 25 MG capsule Take 3 capsules (75 mg total) by mouth 2 (two) times daily. 180 capsule 0  . sulfaSALAzine (AZULFIDINE) 500 MG EC tablet TAKE 2 TABLETS(1000 MG) BY MOUTH TWICE DAILY 120 tablet 0   No current facility-administered medications for this visit.     Allergies as of 10/17/2017 - Review Complete 10/17/2017  Allergen Reaction Noted  . Carboplatin Other (See Comments) 05/29/2014  . Remicade [infliximab] Anaphylaxis 05/13/2012  . Ciprofloxacin Rash 03/29/2017  . Hydromorphone Rash 08/11/2014    Vitals: BP (!) 135/92 (BP Location: Right Arm, Patient Position: Sitting)   Pulse (!) 108   Ht 5\' 6"  (1.676 m)   Wt 196 lb 12.8  oz (89.3 kg)   BMI 31.76 kg/m  Last Weight:  Wt Readings from Last 1 Encounters:  10/17/17 196 lb 12.8 oz (89.3 kg)   Last Height:   Ht Readings from Last 1 Encounters:  10/17/17 5\' 6"  (1.676 m)    + peripheral swelling Bilat LE    Sensation (stable): Absent vibration and proprioception in the  distal extremities. Impaired pp and temp in the distal extremities. Severe Allodynia and hyperesthesias bottom of the feet. +Romberg.   Reflex Exam: Absent ankle jerks  +left Tinel's sign and Phalen's maneuver at the left wrist.   Assessment/Plan: This is a wonderful 76 year old white female with history of stage IIIB non-small cell lung cancer status postcarboplatin and paclitaxel with resultant severe neuropathy. Exam shows severe loss of sensation in the distal extremities, +Romberg, Ataxic gait and Allodynia and hyperesthesias.    Neuropathy: improved, Doing well can try and titrate off of the lyrica, has gained weight and has peripheral swelling stopping the Lyrica may help. Wouldn't recommend starting the Gabapentin unless needed as this can cause weight gain as well as swelling  Likely left>right CTS: emg/ncs of bilateral uppers  Orders Placed This Encounter  Procedures  . NCV with EMG(electromyography)    Sarina Ill, MD  [facility] [address] [place], [place] [address]  Phone [phone] Fax [phone]  A total of 25  minutes was spent face-to-face with this patient. Over half this time was spent on counseling patient on the neuropathy and CTS diagnosis and different diagnostic and therapeutic options available.

## 2017-10-18 ENCOUNTER — Other Ambulatory Visit: Payer: Self-pay | Admitting: *Deleted

## 2017-10-18 MED ORDER — BUDESONIDE-FORMOTEROL FUMARATE 80-4.5 MCG/ACT IN AERO: 2.0000 | INHALATION_SPRAY | Freq: Two times a day (BID) | RESPIRATORY_TRACT | 6 refills | Status: DC

## 2017-10-19 ENCOUNTER — Telehealth: Payer: Self-pay | Admitting: Rheumatology

## 2017-10-19 NOTE — Telephone Encounter (Signed)
I LMOM for patient to call, and schedule rov for April 2019.

## 2017-10-19 NOTE — Telephone Encounter (Signed)
-----   Message from Carole Binning, LPN sent at 28/31/5176 12:20 PM EST ----- Regarding: Please schedule patient a follow up visit Please schedule patient a follow up visit. Patient due April 2019. Thanks!

## 2017-10-20 ENCOUNTER — Other Ambulatory Visit: Payer: Self-pay | Admitting: Rheumatology

## 2017-10-23 ENCOUNTER — Telehealth: Payer: Self-pay | Admitting: Internal Medicine

## 2017-10-23 NOTE — Telephone Encounter (Signed)
Copied from [place]. Topic: Quick Communication - See Telephone Encounter >> Oct 23, 2017 10:29 AM Cleaster Corin, NT wrote: CRM for notification. See Telephone encounter for:   10/23/17. Rj called from [employer] to see if faxed was received. Rj can be reached at  [phone] [phone]

## 2017-10-25 NOTE — Telephone Encounter (Signed)
Last Visit: 09/08/17 Next Visit: 02/07/18 Labs: 09/08/17 elevated glucose and elevated alkaline phophatase  Okay to refill per Dr. Estanislado Pandy

## 2017-11-13 ENCOUNTER — Other Ambulatory Visit: Payer: Self-pay | Admitting: Emergency Medicine

## 2017-11-17 ENCOUNTER — Encounter: Payer: Self-pay | Admitting: Internal Medicine

## 2017-11-17 ENCOUNTER — Ambulatory Visit: Payer: Medicare Other | Admitting: Internal Medicine

## 2017-11-17 VITALS — BP 140/96 | HR 105 | Ht 66.0 in | Wt 195.8 lb

## 2017-11-17 DIAGNOSIS — T451X5A Adverse effect of antineoplastic and immunosuppressive drugs, initial encounter: Secondary | ICD-10-CM

## 2017-11-17 DIAGNOSIS — E109 Type 1 diabetes mellitus without complications: Secondary | ICD-10-CM

## 2017-11-17 DIAGNOSIS — E139 Other specified diabetes mellitus without complications: Secondary | ICD-10-CM

## 2017-11-17 DIAGNOSIS — G620 Drug-induced polyneuropathy: Secondary | ICD-10-CM | POA: Diagnosis not present

## 2017-11-17 LAB — POCT GLYCOSYLATED HEMOGLOBIN (HGB A1C): HEMOGLOBIN A1C: 8.8

## 2017-11-17 LAB — GLUCOSE, POCT (MANUAL RESULT ENTRY): POC Glucose: 87 mg/dl (ref 70–99)

## 2017-11-17 MED ORDER — INSULIN LISPRO 100 UNIT/ML (KWIKPEN): PEN_INJECTOR | SUBCUTANEOUS | 3 refills | Status: DC

## 2017-11-17 MED ORDER — INSULIN DETEMIR 100 UNIT/ML FLEXPEN: PEN_INJECTOR | SUBCUTANEOUS | 3 refills | Status: DC

## 2017-11-17 NOTE — Progress Notes (Signed)
Patient ID: Megan [name], female   DOB: June 21, 1941, 77 y.o.   MRN: [id number]  HPI: Megan [name] is a 77 y.o.-year-old female, returning for f/u for DM2, dx in 2013, and as LADA in 04/2016, insulin-dependent since 2014, uncontrolled, with complications (DKA, ? Related PN). Last visit 3 mo ago.  Reviewed history: She has a complicated medical history, with a diagnosis of ulcerative colitis with significant diarrhea that caused dehydration in the past and episodes of DKA. She had 2 admissions for this in 02/26/2016 and 04/14/2016. While in rehabilitation, her CBGs dropped to 30s on her previous dose of insulin, therefore, her long-acting insulin was decreased.   She also has a history of lung cancer diagnosed 09/2013. She had chemotherapy and radiation therapy for this.  Last hemoglobin A1c was: 08/17/2017: HbA1c 6.8% Lab Results  Component Value Date   HGBA1C 7.3 05/18/2017   HGBA1C 7.9 02/15/2017   HGBA1C 8.7 11/11/2016  12/30/2015: 12% 09/18/2015: 11.3% 06/17/2015: 7.5% 04/08/2015: 10.9% 09/10/2014: 8.5%  11/04/2013: 6.2%  At last visit, we adjusted to: - Levemir 15 units in am and 25 at bedtime - Humalog mealtime dose  - small meal: 16 units  - regular meal: 20 units  - larger meal: 26 units  Please do not skip the insulin if sugars are 70-100.   If the sugars are 60-70, take 1/2 of the Humalog dose.  If the sugars are <60, skip insulin with that meal  No Humalog at bedtime, unless sugars >300: take 5 units or >400: 8 units She had a SSI before, but was not using it.  Pt checks her sugars 3 times a day per her great log sugars continue to be very fluctuating, but higher since last visit: - am:   84-393 >> 90, 105-306 >> 68-323, 419, 536 >> 175-379, 438 - 2h after b'fast: n/c >> 171-528 >> n/c >> 59 >> n/c - before lunch:   159-464 >> 126-332 >> 67-316, 450 >> 100-281, 412 - 2h after lunch: n/c >> 134-439 >> n/c >> 47 >> 79 - before dinner:  98-576 >> 118,  146-449 >> 85-364 >> 66, 130-528 - 2h after dinner: n/c >> 128-479 >> n/c  - bedtime: 113-447 >> 63, 144-358 >> 88-336, 431, 510 >> 108-352, 430 - nighttime: n/c >> 57 x1 >> n/c Lowest sugar was 47 >> 66; she has hypoglycemia awareness at 50s.  Highest sugar was 500s >> 528  Pt's meals are: - Breakfast: Cereal or oatmeal with blueberries or eggs with sausage - Lunch: Sandwich and soup - Dinner: Meat, vegetables, small amount of starch - Snacks: 3-4 a day: Peanuts  -No CKD, last BUN/creatinine:  Lab Results  Component Value Date   BUN 13.8 09/08/2017   BUN 16.8 05/08/2017   CREATININE 0.9 09/08/2017   CREATININE 0.8 05/08/2017   -+ HL: last set of lipids: Lab Results  Component Value Date   CHOL 201 (H) 11/14/2016   HDL 57.40 11/14/2016   LDLCALC 128 (H) 11/14/2016   TRIG 79.0 11/14/2016   CHOLHDL 4 11/14/2016   - last eye exam was in 08/2016: No DR. Dr. Lady Gary. -+ Numbness and tingling in her feet. She has PN from ChTx.  Since last visit, she stopped Lyrica.  She was given Neurontin, but did not start yet.  ROS: Constitutional: + Weight gain/no weight loss, + fatigue, + subjective hyperthermia, no subjective hypothermia, + nocturia Eyes: + Blurry vision, no xerophthalmia ENT: no sore throat, no nodules palpated in throat, no  dysphagia, no odynophagia, no hoarseness Cardiovascular: no CP/+ SOB/no palpitations/no leg swelling Respiratory: + Cough/+ SOB/+ wheezing Gastrointestinal: no N/no V/no D/no C/no acid reflux Musculoskeletal: no muscle aches/no joint aches Skin: no rashes, no hair loss Neurological: no tremors/+ numbness/+ tingling/no dizziness  I reviewed pt's medications, allergies, PMH, social hx, family hx, and changes were documented in the history of present illness. Otherwise, unchanged from my initial visit note.  Past Medical History:  Diagnosis Date  . Cancer ([place]) dx'd 09/2013   Lung ca  . Concussion 2018  . Diabetes ([place]) 09/08/2016   type I  diabetic patient reported on 09/13/16   . Diabetes mellitus    insulin  . Diabetic neuropathy ([place]) 09/08/2016  . Fall 2018  . GERD (gastroesophageal reflux disease)    no meds for  . History of colitis   . History of hiatal hernia   . Hx of radiation therapy 12/16/13-01/30/14   lung 66Gy  . Hypertension   . Malignant neoplasm of right upper lobe of lung ([place]) 11/28/2013  . Neuropathy   . Psoriasis   . Psoriatic arthritis ([name]) 09/08/2016  . Rheumatoid arthritis ([place])   . Shortness of breath dyspnea    with exertion   Past Surgical History:  Procedure Laterality Date  . ABDOMINAL HYSTERECTOMY    . ABDOMINAL SURGERY    . BALLOON DILATION N/A 12/12/2014   Procedure: BALLOON DILATION;  Surgeon: Inda Castle, MD;  Location: [facility];  Service: Endoscopy;  Laterality: N/A;  . CHOLECYSTECTOMY    . COLONOSCOPY N/A 02/07/2016   Procedure: COLONOSCOPY;  Surgeon: Doran Stabler, MD;  Location: [facility] ENDOSCOPY;  Service: Endoscopy;  Laterality: N/A;  . DILATION AND CURETTAGE OF UTERUS    . ESOPHAGOGASTRODUODENOSCOPY N/A 12/12/2014   Procedure: ESOPHAGOGASTRODUODENOSCOPY (EGD);  Surgeon: Inda Castle, MD;  Location: [facility];  Service: Endoscopy;  Laterality: N/A;  with dilation  . ESOPHAGOGASTRODUODENOSCOPY (EGD) WITH PROPOFOL N/A 10/09/2014   Procedure: ESOPHAGOGASTRODUODENOSCOPY (EGD) WITH PROPOFOL;  Surgeon: Inda Castle, MD;  Location: [facility];  Service: Endoscopy;  Laterality: N/A;  . ESOPHAGOGASTRODUODENOSCOPY (EGD) WITH PROPOFOL N/A 09/19/2016   Procedure: ESOPHAGOGASTRODUODENOSCOPY (EGD) WITH PROPOFOL;  Surgeon: Doran Stabler, MD;  Location: [facility] ENDOSCOPY;  Service: Gastroenterology;  Laterality: N/A;  with savary dil tt  . FRACTURE SURGERY    . ORIF ANKLE FRACTURE Right 07/04/2015   Procedure: OPEN REDUCTION INTERNAL FIXATION (ORIF) ANKLE FRACTURE;  Surgeon: Newt Minion, MD;  Location: [facility];  Service: Orthopedics;  Laterality: Right;  OPEN REDUCTION, INTERNAL  FIXATION OF RIGHT ANKLE FRACTURE.   [name] ORIF ANKLE FRACTURE Left 02/10/2016   Procedure: OPEN REDUCTION INTERNAL FIXATION (ORIF) ANKLE FRACTURE;  Surgeon: Newt Minion, MD;  Location: [facility];  Service: Orthopedics;  Laterality: Left;  . SAVORY DILATION N/A 10/09/2014   Procedure: SAVORY DILATION;  Surgeon: Inda Castle, MD;  Location: [facility];  Service: Endoscopy;  Laterality: N/A;  . SAVORY DILATION N/A 09/19/2016   Procedure: SAVORY DILATION;  Surgeon: Doran Stabler, MD;  Location: [facility] ENDOS[facility];  Service: Gastroenterology;  Laterality: N/A;  . TONSILLECTOMY     Social History   Social History  . Marital Status: Divorced    Spouse Name: N/A  . Number of Children: 0   Occupational History  . Retired Pharmacist, hospital Other   Social History Main Topics  . Smoking status: Former Smoker -- 3.00 packs/day for 45 years    Types: Cigarettes    Quit date: 12/05/1997  .  Smokeless tobacco: Never Used  . Alcohol Use: No  . Drug Use: No   Social History Narrative   Patient lives at home alone.   Caffeine Use: 2 cups daily   Current Outpatient Medications on File Prior to Visit  Medication Sig Dispense Refill  . amLODipine (NORVASC) 2.5 MG tablet Take 1 tablet (2.5 mg total) by mouth daily. 90 tablet 3  . augmented betamethasone dipropionate (DIPROLENE-AF) 0.05 % ointment APP AA BID PRN  1  . B-D UF III MINI PEN NEEDLES 31G X 5 MM MISC USE TO CHECK SUGAR FIVE TIMES DAILY 100 each 0  . budesonide-formoterol (SYMBICORT) 80-4.5 MCG/ACT inhaler Inhale 2 puffs into the lungs 2 (two) times daily. 1 Inhaler 6  . Continuous Blood Gluc Receiver (FREESTYLE LIBRE 14 DAY READER) DEVI 1 Device by Does not apply route daily. 1 Device 0  . Continuous Blood Gluc Sensor (FREESTYLE LIBRE 14 DAY SENSOR) MISC Inject 1 Device into the skin every 14 (fourteen) days. 3 each 2  . DULoxetine (CYMBALTA) 60 MG capsule Take 1 capsule (60 mg total) by mouth 2 (two) times daily. 60 capsule 12  . gabapentin (NEURONTIN)  300 MG capsule Take 1 capsule (300 mg total) by mouth 2 (two) times daily. 60 capsule 3  . glucose blood test strip Use as instructed 4x a day - Verio 200 each 5  . guaiFENesin (MUCINEX) 600 MG 12 hr tablet Take 1 tablet (600 mg total) by mouth 2 (two) times daily. 30 tablet 3  . Insulin Detemir (LEVEMIR FLEXTOUCH) 100 UNIT/ML Pen Inject 20 units in the morning, and 25 units in the pm. 15 pen 3  . insulin lispro (HUMALOG KWIKPEN) 100 UNIT/ML KiwkPen ADMINISTER 15 TO 26 UNITS UNDER THE SKIN THREE TIMES DAILY 15 pen 3  . Insulin Pen Needle (B-D UF III MINI PEN NEEDLES) 31G X 5 MM MISC USE TO take insulin FIVE TIMES DAILY 200 each 5  . loratadine (CLARITIN) 10 MG tablet TAKE 1 TABLET BY MOUTH DAILY 30 tablet 5  . omeprazole (PRILOSEC) 20 MG capsule Take 1 capsule (20 mg total) by mouth daily. 30 capsule 11  . ONETOUCH DELICA LANCETS FINE MISC USE TO CHECK BLOOD SUGAR FOUR TIMES DAILY 200 each 5  . ONETOUCH DELICA LANCETS FINE MISC TEST BLOOD SUGAR FIVE TIMES DAILY AS NEEDED 200 each 0  . pregabalin (LYRICA) 25 MG capsule Take 3 capsules (75 mg total) by mouth 2 (two) times daily. 180 capsule 0  . sulfaSALAzine (AZULFIDINE) 500 MG EC tablet TAKE 2 TABLETS(1000 MG) BY MOUTH TWICE DAILY 120 tablet 0   No current facility-administered medications on file prior to visit.    Allergies  Allergen Reactions  . Carboplatin Other (See Comments)    Neuropathy  . Remicade [Infliximab] Anaphylaxis  . Ciprofloxacin Rash  . Hydromorphone Rash   Family History  Problem Relation Age of Onset  . Other Mother   . Other Father        failure to thrive  . Hypertension Other   . Stroke Other   . Heart attack Other   . Hemachromatosis Other   . Rheum arthritis Unknown        both sets of grandparents and father   PE: BP (!) 154/98   Pulse (!) 105   Ht 5\' 6"  (1.676 m)   Wt 195 lb 12.8 oz (88.8 kg)   SpO2 96%   BMI 31.60 kg/m  Wt Readings from Last 3 Encounters:  11/17/17 195 lb  12.8 oz (88.8 kg)   10/17/17 196 lb (88.9 kg)  10/17/17 196 lb 12.8 oz (89.3 kg)   Constitutional: overweight, in NAD Eyes: PERRLA, EOMI, no exophthalmos ENT: moist mucous membranes, no thyromegaly, no cervical lymphadenopathy Cardiovascular: RRR, No MRG Respiratory: CTA B Gastrointestinal: abdomen soft, NT, ND, BS+ Musculoskeletal: no deformities, strength intact in all 4 Skin: moist, warm, no rashes Neurological: no tremor with outstretched hands, DTR normal in all 4  ASSESSMENT: 1. LADA, insulin-dependent, uncontrolled, with complications - DKA in the setting of dehydration (- PN) - this may be 2/2 ChTx.  Pulmonologist: Dr. Chase Caller Oncologist: Dr. Earlie Server Cardiologist: Dr. Sallyanne Kuster Ophthalmologist: Dr. Clifton James  2.  Peripheral neuropathy - Unclear if also from Diabetes  PLAN:  1. Patient with long-standing, poorly controlled, LADA, on basal-bolus insulin regimen, with very fluctuating CBGs.  The fluctuations in her blood sugars do not appear with any particular pattern.  At last visit, I advised her to enter comments about the sugars in her log and also to document the insulin doses that she is taking with every meal to be able to find possible causes for her extremely variable blood sugars. - sugars now are even higher due to the holidays.  They are still extremely fluctuating, without clear patterns, therefore, we will increase the Levemir dose in the morning and also increase slightly all her Humalog mealtime doses. - At last visit, we also discussed about the freestyle libre CGM and given info about suppliers. She did not get this yet >> given her a new list of suppliers. - Glucose in the office was in the 80s today and she was given glucose tablets - I suggested to:  Patient Instructions  Please increase: - Levemir 20 units in am and 25 at bedtime - Humalog mealtime dose  - small meal: 18 units  - regular meal: 22 units  - larger meal: 28 units   Please do not skip the insulin if  sugars are 70-100.   If the sugars are 60-70, take 1/2 of the Humalog dose.  If the sugars are <60, skip insulin with that meal   No Humalog at bedtime, unless sugars >300: take 5 units or >400: 8 units   Please return in 3 months with your sugar log.   - today, HbA1c is 8.8% (higher) - continue checking sugars at different times of the day - check 3-4x a day, rotating checks - advised for yearly eye exams >> she is UTD - Return to clinic in 3 mo with sugar log   2. PN - She was previously on Lyrica - She was given a prescription for Neurontin, but did not start it yet.  She wanted my opinion whether to start it or not.  We discussed about possible side effects:  sedation, weight gain.  She will think about starting it.  Philemon Kingdom, MD PhD [facility] Endocr[facility]

## 2017-11-17 NOTE — Patient Instructions (Addendum)
Please increase: - Levemir 20 units in am and 25 at bedtime - Humalog mealtime dose  - small meal: 18 units  - regular meal: 22 units  - larger meal: 28 units   Please do not skip the insulin if sugars are 70-100.   If the sugars are 60-70, take 1/2 of the Humalog dose.  If the sugars are <60, skip insulin with that meal   No Humalog at bedtime, unless sugars >300: take 5 units or >400: 8 units   Please return in 3 months with your sugar log.

## 2017-11-17 NOTE — Addendum Note (Signed)
Addended by: Drucilla Schmidt on: 11/17/2017 04:52 PM   Modules accepted: Orders

## 2017-11-20 ENCOUNTER — Other Ambulatory Visit: Payer: Self-pay | Admitting: Rheumatology

## 2017-11-20 NOTE — Telephone Encounter (Signed)
Last Visit: 09/08/17 Next Visit: 02/07/18 Labs: 09/08/17 elevated glucose and elevated alkaline phophatase  Okay to refill per Dr. Estanislado Pandy

## 2017-11-21 ENCOUNTER — Telehealth: Payer: Self-pay

## 2017-11-21 NOTE — Telephone Encounter (Signed)
Authorization was denied for patient per [employer] for freestyle Kit.  Per insurance not covered  Reference # [id number]

## 2017-11-29 ENCOUNTER — Ambulatory Visit (INDEPENDENT_AMBULATORY_CARE_PROVIDER_SITE_OTHER): Payer: Medicare Other | Admitting: Neurology

## 2017-11-29 ENCOUNTER — Ambulatory Visit: Payer: Medicare Other | Admitting: Neurology

## 2017-11-29 DIAGNOSIS — G5600 Carpal tunnel syndrome, unspecified upper limb: Secondary | ICD-10-CM

## 2017-11-29 DIAGNOSIS — G5602 Carpal tunnel syndrome, left upper limb: Secondary | ICD-10-CM

## 2017-11-29 DIAGNOSIS — Z0289 Encounter for other administrative examinations: Secondary | ICD-10-CM

## 2017-11-29 NOTE — Procedures (Signed)
Full Name: Shelsy Seng Gender: Female MRN #: [id number] Date of Birth: 1941-05-17    Visit Date: 11/29/2017 10:37 Age: 77 Years 68 Months Old Examining Physician: Sarina Ill, MD  Referring Physician: Jaynee Eagles, MD    History: Evaluation for Carpal Tunnel Syndrome  Conclusion:   1. There is moderately-severe right Carpal Tunnel Syndrome. 2. There is left-sided non-localizing Ulnar Neuropathy.  3. There is also concomitant polyneuropathy.   Sarina Ill M.D.  [facility] [place], [place] [address] Tel: [phone] Fax: [phone]        [employer]    Nerve / Sites Muscle Latency Ref. Amplitude Ref. Rel Amp Segments Distance Velocity Ref. Area    ms ms mV mV %  cm m/s m/s mVms  R Median - APB     Wrist APB 4.6 ?4.4 3.7 ?4.0 100 Wrist - APB 7   13.0     Upper arm APB 9.1  3.3  88.2 Upper arm - Wrist 22 49 ?49 12.1  L Median - APB     Wrist APB 4.0 ?4.4 6.0 ?4.0 100 Wrist - APB 7   24.1     Upper arm APB 8.5  5.7  93.5 Upper arm - Wrist 22 49 ?49 24.4  R Ulnar - ADM     Wrist ADM 3.0 ?3.3 9.6 ?6.0 100 Wrist - ADM 7   31.7     B.Elbow ADM 6.3  8.6  89.4 B.Elbow - Wrist 18 54 ?49 30.1     A.Elbow ADM 8.8  8.5  99.1 A.Elbow - B.Elbow 12 49 ?49 30.0         A.Elbow - Wrist      L Ulnar - ADM     Wrist ADM 3.6 ?3.3 3.6 ?6.0 100 Wrist - ADM 7   17.5     B.Elbow ADM 7.9  3.1  85.5 B.Elbow - Wrist 20 47 ?49 17.5     A.Elbow ADM 10.4  3.2  104 A.Elbow - B.Elbow 12 47 ?49 16.9         A.Elbow - Wrist                 SNC    Nerve / Sites Rec. Site Peak Lat Ref.  Amp Ref. Segments Distance Peak Diff Ref.    ms ms V V  cm ms ms  R Radial - Anatomical snuff box (Forearm)     Forearm Wrist 2.9 ?2.9 3 ?15 Forearm - Wrist 10    L Radial - Anatomical snuff box (Forearm)     Forearm Wrist 2.8 ?2.9 6 ?15 Forearm - Wrist 10    R Median, Ulnar - Transcarpal comparison     Median Palm Wrist 3.1 ?2.2 6 ?35 Median Palm - Wrist 8       Ulnar Palm  Wrist 2.2 ?2.2 13 ?12 Ulnar Palm - Wrist 8          Median Palm - Ulnar Palm  0.9 ?0.4  L Median, Ulnar - Transcarpal comparison     Median Palm Wrist 2.5 ?2.2 9 ?35 Median Palm - Wrist 8       Ulnar Palm Wrist NR ?2.2 NR ?12 Ulnar Palm - Wrist 8          Median Palm - Ulnar Palm  NR ?0.4  L Median, Radial - Thumb comparison     Median Wrist Thumb 2.9  5  Median Wrist - Thumb 10  Radial Wrist Thumb 3.0  3  Radial Wrist - Thumb 10          Median Wrist - Radial Wrist  -0.1 ?0.5  R Median - Orthodromic (Dig II, Mid palm)     Dig II Wrist NR ?3.4 NR ?10 Dig II - Wrist 13    L Median - Orthodromic (Dig II, Mid palm)     Dig II Wrist 3.2 ?3.4 6 ?10 Dig II - Wrist 13    R Ulnar - Orthodromic, (Dig V, Mid palm)     Dig V Wrist 2.7 ?3.1 2 ?5 Dig V - Wrist 11    L Ulnar - Orthodromic, (Dig V, Mid palm)     Dig V Wrist NR ?3.1 NR ?5 Dig V - Wrist 39                         F  Wave    Nerve F Lat Ref.   ms ms  R Ulnar - ADM 30.9 ?32.0  L Ulnar - ADM 37.7 ?32.0              EMG full       EMG Summary Table    Spontaneous MUAP Recruitment  Muscle IA Fib PSW Fasc Other Amp Dur. Poly Pattern  L. Deltoid Normal None None None _______ Normal Normal Normal Normal  L. Triceps brachii Normal None None None _______ Normal Normal Normal Normal  L. Pronator teres Normal None None None _______ Normal Normal Normal Normal  L. First dorsal interosseous Normal None None None _______ Normal Normal Normal Normal  L. Opponens pollicis Normal None None None _______ Normal Normal Normal Normal  L. Flexor digitorum profundus (Ulnar) Normal None None None _______ Normal Normal Normal Normal

## 2017-11-29 NOTE — Progress Notes (Signed)
See procedure note.

## 2017-11-29 NOTE — Progress Notes (Signed)
Full Name: Megan [name] Gender: Female MRN #: [id number] Date of Birth: 06/28/1941    Visit Date: 11/29/2017 10:37 Age: 77 Years 29 Months Old Examining Physician: Sarina Ill, MD  Referring Physician: Jaynee Eagles, MD    History: Evaluation for Carpal Tunnel Syndrome  Conclusion:   1. There is moderately-severe right Carpal Tunnel Syndrome. 2. There  left-sided non-localizing Ulnar Neuropathy.  3. There is also concomitant polyneuropathy.   Sarina Ill M.D.  [facility] [place], [place] [address] Tel: [phone] Fax: [phone]        [facility]    Nerve / Sites Muscle Latency Ref. Amplitude Ref. Rel Amp Segments Distance Velocity Ref. Area    ms ms mV mV %  cm m/s m/s mVms  R Median - APB     Wrist APB 4.6 ?4.4 3.7 ?4.0 100 Wrist - APB 7   13.0     Upper arm APB 9.1  3.3  88.2 Upper arm - Wrist 22 49 ?49 12.1  L Median - APB     Wrist APB 4.0 ?4.4 6.0 ?4.0 100 Wrist - APB 7   24.1     Upper arm APB 8.5  5.7  93.5 Upper arm - Wrist 22 49 ?49 24.4  R Ulnar - ADM     Wrist ADM 3.0 ?3.3 9.6 ?6.0 100 Wrist - ADM 7   31.7     B.Elbow ADM 6.3  8.6  89.4 B.Elbow - Wrist 18 54 ?49 30.1     A.Elbow ADM 8.8  8.5  99.1 A.Elbow - B.Elbow 12 49 ?49 30.0         A.Elbow - Wrist      L Ulnar - ADM     Wrist ADM 3.6 ?3.3 3.6 ?6.0 100 Wrist - ADM 7   17.5     B.Elbow ADM 7.9  3.1  85.5 B.Elbow - Wrist 20 47 ?49 17.5     A.Elbow ADM 10.4  3.2  104 A.Elbow - B.Elbow 12 47 ?49 16.9         A.Elbow - Wrist                 SNC    Nerve / Sites Rec. Site Peak Lat Ref.  Amp Ref. Segments Distance Peak Diff Ref.    ms ms V V  cm ms ms  R Radial - Anatomical snuff box (Forearm)     Forearm Wrist 2.9 ?2.9 3 ?15 Forearm - Wrist 10    L Radial - Anatomical snuff box (Forearm)     Forearm Wrist 2.8 ?2.9 6 ?15 Forearm - Wrist 10    R Median, Ulnar - Transcarpal comparison     Median Palm Wrist 3.1 ?2.2 6 ?35 Median Palm - Wrist 8       Ulnar Palm Wrist  2.2 ?2.2 13 ?12 Ulnar Palm - Wrist 8          Median Palm - Ulnar Palm  0.9 ?0.4  L Median, Ulnar - Transcarpal comparison     Median Palm Wrist 2.5 ?2.2 9 ?35 Median Palm - Wrist 8       Ulnar Palm Wrist NR ?2.2 NR ?12 Ulnar Palm - Wrist 8          Median Palm - Ulnar Palm  NR ?0.4  L Median, Radial - Thumb comparison     Median Wrist Thumb 2.9  5  Median Wrist - Thumb 10  Radial Wrist Thumb 3.0  3  Radial Wrist - Thumb 10          Median Wrist - Radial Wrist  -0.1 ?0.5  R Median - Orthodromic (Dig II, Mid palm)     Dig II Wrist NR ?3.4 NR ?10 Dig II - Wrist 13    L Median - Orthodromic (Dig II, Mid palm)     Dig II Wrist 3.2 ?3.4 6 ?10 Dig II - Wrist 13    R Ulnar - Orthodromic, (Dig V, Mid palm)     Dig V Wrist 2.7 ?3.1 2 ?5 Dig V - Wrist 11    L Ulnar - Orthodromic, (Dig V, Mid palm)     Dig V Wrist NR ?3.1 NR ?5 Dig V - Wrist 48                         F  Wave    Nerve F Lat Ref.   ms ms  R Ulnar - ADM 30.9 ?32.0  L Ulnar - ADM 37.7 ?32.0              EMG full       EMG Summary Table    Spontaneous MUAP Recruitment  Muscle IA Fib PSW Fasc Other Amp Dur. Poly Pattern  L. Deltoid Normal None None None _______ Normal Normal Normal Normal  L. Triceps brachii Normal None None None _______ Normal Normal Normal Normal  L. Pronator teres Normal None None None _______ Normal Normal Normal Normal  L. First dorsal interosseous Normal None None None _______ Normal Normal Normal Normal  L. Opponens pollicis Normal None None None _______ Normal Normal Normal Normal  L. Flexor digitorum profundus (Ulnar) Normal None None None _______ Normal Normal Normal Normal

## 2017-11-30 ENCOUNTER — Encounter: Payer: Self-pay | Admitting: Internal Medicine

## 2017-11-30 DIAGNOSIS — G5601 Carpal tunnel syndrome, right upper limb: Secondary | ICD-10-CM | POA: Insufficient documentation

## 2017-12-07 LAB — HM DIABETES EYE EXAM

## 2017-12-15 ENCOUNTER — Encounter: Payer: Self-pay | Admitting: Internal Medicine

## 2017-12-15 ENCOUNTER — Ambulatory Visit: Payer: Medicare Other | Admitting: Internal Medicine

## 2017-12-15 VITALS — BP 140/90 | HR 108 | Ht 66.0 in | Wt 195.0 lb

## 2017-12-15 DIAGNOSIS — R061 Stridor: Secondary | ICD-10-CM

## 2017-12-15 DIAGNOSIS — J439 Emphysema, unspecified: Secondary | ICD-10-CM | POA: Diagnosis not present

## 2017-12-15 DIAGNOSIS — R0600 Dyspnea, unspecified: Secondary | ICD-10-CM | POA: Diagnosis not present

## 2017-12-15 NOTE — Patient Instructions (Signed)
Pulmonary emphysema, unspecified emphysema type ([place]) - glad better  -continue symbicort daily - Please talk to PCP Binnie Rail, MD -  and ensure you get  shingarix vaccine (reportedly inactivated vaccine)    Dyspnea, unspecified type Inspiratory stridor - glad ENT showed no stridor - will hold off [facility] referral for stent placement because this is not bothering you much  Followup - 6 months or sooner

## 2017-12-15 NOTE — Progress Notes (Signed)
Subjective:     Patient ID: Megan [name], female   DOB: [birth year], 77 y.o.   MRN: [id number]  HPI    OV 09/05/2017  Chief Complaint  Patient presents with  . Follow-up    Pt states that she has been doing good except that she has had some chest congestion and a cough with mild SOB.    77 year old female stage IIIB lung cancer was survive the odds and has cancer in complete remission. Currently on expectant follow-up with Dr. Julien Nordmann. She has mild emphysema based on based onreduction in diffusion capacity in January 2015 and CT scan findings.Her course was   complicated by diabetes and neuropathy and depression and weight loss and fatigue all due to chemotherapy. Currently she is on observation therapy. I'm not seen her personally in a while. She returns now because of ongoing cough and congestion and nocturnal [name] the last 1 year. There is no sputum production. She saw my colleague Dr. Lamonte Sakai a few weeks ago and was given Claritin which is helped partially but only a little bit. Symptoms still persist. She is not taking her Symbicort anymore. She does not even remember me prescribing this. FeNO 18ppb 09/05/2017 . [name] She's had significant weight gain since I last saw her . RSI cough score 21 and c/w LPR cough    OV 10/17/2017  Chief Complaint  Patient presents with  . Follow-up    Pt states she has been doing pretty good since last visit. States that she is still coughing since last visit but is some less. Also states that she becomes SOB with exertion. Denies any CP.    Follow-up cough  Last visit on basis of emphysema was started on Symbicort.  She tells me that the cough might be better but she did not fully sure.  Definitely she is not worse.  RSI cough score is the same at 21.  In addition she is complaining of some dyspnea.  This dyspnea is there on exertion but also when she talks.  She has a stridorous noise at the end of inspiration at rest and she tells me that it has been  like this for 3 years.  She has tried some Mucinex without much success.  But there is no symptoms of COPD exacerbation.  Again it is not fully clear if she is compliant with her Symbicort.  Most recently she had CT scan of the chest that shows no evidence of cancer recurrence but there is endobronchial compression of her main bronchial tubes.  She denies any intubation or radiation treatment to the neck but she did get radiation to the upper chest.  She is not very interested and stent replacement or evaluation by interventional pulmonary.  She is open to having a ENT evaluation to evaluate her neck   OV 12/15/2017   Chief Complaint  Patient presents with  . Follow-up    Pt states she is becoming more SOB compared to last visit. Pt was referred to ENT and had a visit x2weeks ago. Pt does still have some complaints of cough but it has improved.  Denies any CP.   77 year old female with mild emphysema and then post cancer chemotherapy radiation obstructive physiology with inspiratory stridor.  Presents for follow-up.  She is doing well with Symbicort.  RSI cough score is dropped to 10.  She still has inspiratory stridor.  She saw ENT 2 weeks ago Dr. Redmond Baseman and apparently there is no head and neck issue.  She  agrees that the stridor is coming from the upper airways in the chest.  She does not want to go to [facility] to evaluate for stent placement..  This is because symptoms are considered mild.  She is having some postnasal drip for the last few weeks and she will try saline nasal spray.  She does not want to do steroid spray. .  Dr Lorenza Cambridge Reflux Symptom Index (> 13-15 suggestive of LPR cough)  09/05/2017  10/17/2017  12/15/2017   Hoarseness of problem with voice 3 3 3   Clearing  Of Throat 4 3 1   Excess throat mucus or feeling of post nasal drip 1 3 4   Difficulty swallowing food, liquid or tablets 1 3 0  Cough after eating or lying down 3 3 0  Breathing difficulties or choking episodes 1 1  0  Troublesome or annoying cough 4 2 2   Sensation of something sticking in throat or lump in throat 0 0 0  Heartburn, chest pain, indigestion, or stomach acid coming up 4 3 0  TOTAL 21 21 10      has a past medical history of Cancer ([place]) (dx'd 09/2013), Concussion (2018), Diabetes ([place]) (09/08/2016), Diabetes mellitus, Diabetic neuropathy ([place]) (09/08/2016), Fall (2018), GERD (gastroesophageal reflux disease), History of colitis, History of hiatal hernia, radiation therapy (12/16/13-01/30/14), Hypertension, Malignant neoplasm of right upper lobe of lung ([place]) (11/28/2013), Neuropathy, Psoriasis, Psoriatic arthritis ([place]) (09/08/2016), Rheumatoid arthritis ([place]), and Shortness of breath dyspnea.   reports that she quit smoking about 20 years ago. Her smoking use included cigarettes. She has a 135.00 pack-year smoking history. she has never used smokeless tobacco.  Past Surgical History:  Procedure Laterality Date  . ABDOMINAL HYSTERECTOMY    . ABDOMINAL SURGERY    . BALLOON DILATION N/A 12/12/2014   Procedure: BALLOON DILATION;  Surgeon: Inda Castle, MD;  Location: [facility];  Service: Endoscopy;  Laterality: N/A;  . CHOLECYSTECTOMY    . COLONOSCOPY N/A 02/07/2016   Procedure: COLONOSCOPY;  Surgeon: Doran Stabler, MD;  Location: [facility] END[facility]COPY;  Service: Endoscopy;  Laterality: N/A;  . DILATION AND CURETTAGE OF UTERUS    . ESOPHAGOGASTRODUODENOSCOPY N/A 12/12/2014   Procedure: ESOPHAGOGASTRODUODENOSCOPY (EGD);  Surgeon: Inda Castle, MD;  Location: [facility];  Service: Endoscopy;  Laterality: N/A;  with dilation  . ESOPHAGOGASTRODUODENOSCOPY (EGD) WITH PROPOFOL N/A 10/09/2014   Procedure: ESOPHAGOGASTRODUODENOSCOPY (EGD) WITH PROPOFOL;  Surgeon: Inda Castle, MD;  Location: [facility];  Service: Endoscopy;  Laterality: N/A;  . ESOPHAGOGASTRODUODENOSCOPY (EGD) WITH PROPOFOL N/A 09/19/2016   Procedure: ESOPHAGOGASTRODUODENOSCOPY (EGD) WITH PROPOFOL;  Surgeon: Doran Stabler, MD;   Location: [facility] ENDOSCOPY;  Service: Gastroenterology;  Laterality: N/A;  with savary dil tt  . FRACTURE SURGERY    . ORIF ANKLE FRACTURE Right 07/04/2015   Procedure: OPEN REDUCTION INTERNAL FIXATION (ORIF) ANKLE FRACTURE;  Surgeon: Newt Minion, MD;  Location: [facility];  Service: Orthopedics;  Laterality: Right;  OPEN REDUCTION, INTERNAL FIXATION OF RIGHT ANKLE FRACTURE.   [name] ORIF ANKLE FRACTURE Left 02/10/2016   Procedure: OPEN REDUCTION INTERNAL FIXATION (ORIF) ANKLE FRACTURE;  Surgeon: Newt Minion, MD;  Location: [facility];  Service: Orthopedics;  Laterality: Left;  . SAVORY DILATION N/A 10/09/2014   Procedure: SAVORY DILATION;  Surgeon: Inda Castle, MD;  Location: [facility];  Service: Endoscopy;  Laterality: N/A;  . SAVORY DILATION N/A 09/19/2016   Procedure: SAVORY DILATION;  Surgeon: Doran Stabler, MD;  Location: [facility];  Service: Gastroenterology;  Laterality:  N/A;  . TONSILLECTOMY      Allergies  Allergen Reactions  . Carboplatin Other (See Comments)    Neuropathy  . Remicade [Infliximab] Anaphylaxis  . Ciprofloxacin Rash  . Hydromorphone Rash    Immunization History  Administered Date(s) Administered  . Influenza, High Dose Seasonal PF 08/12/2016, 08/30/2017  . Influenza,inj,Quad PF,6+ Mos 11/07/2013, 07/14/2014, 08/25/2015  . PPD Test 03/02/2016  . Pneumococcal-Unspecified 12/02/2015  . Tdap 01/11/2017    Family History  Problem Relation Age of Onset  . Other Mother   . Other Father        failure to thrive  . Hypertension Other   . Stroke Other   . Heart attack Other   . Hemachromatosis Other   . Rheum arthritis Unknown        both sets of grandparents and father     Current Outpatient Medications:  .  amLODipine (NORVASC) 2.5 MG tablet, Take 1 tablet (2.5 mg total) by mouth daily., Disp: 90 tablet, Rfl: 3 .  augmented betamethasone dipropionate (DIPROLENE-AF) 0.05 % ointment, APP AA BID PRN, Disp: , Rfl: 1 .  B-D UF III MINI PEN NEEDLES 31G X 5 MM  MISC, USE TO CHECK SUGAR FIVE TIMES DAILY, Disp: 100 each, Rfl: 0 .  budesonide-formoterol (SYMBICORT) 80-4.5 MCG/ACT inhaler, Inhale 2 puffs into the lungs 2 (two) times daily., Disp: 1 Inhaler, Rfl: 6 .  Continuous Blood Gluc Receiver (FREESTYLE LIBRE 14 DAY READER) DEVI, 1 Device by Does not apply route daily., Disp: 1 Device, Rfl: 0 .  Continuous Blood Gluc Sensor (FREESTYLE LIBRE 14 DAY SENSOR) MISC, Inject 1 Device into the skin every 14 (fourteen) days., Disp: 3 each, Rfl: 2 .  DULoxetine (CYMBALTA) 60 MG capsule, Take 1 capsule (60 mg total) by mouth 2 (two) times daily., Disp: 60 capsule, Rfl: 12 .  glucose blood test strip, Use as instructed 4x a day - Verio, Disp: 200 each, Rfl: 5 .  Insulin Detemir (LEVEMIR FLEXTOUCH) 100 UNIT/ML Pen, Inject 20 units in the morning, and 25 units in the pm., Disp: 15 pen, Rfl: 3 .  insulin lispro (HUMALOG KWIKPEN) 100 UNIT/ML KiwkPen, ADMINISTER 18 TO 28 UNITS UNDER THE SKIN THREE TIMES DAILY, Disp: 15 pen, Rfl: 3 .  Insulin Pen Needle (B-D UF III MINI PEN NEEDLES) 31G X 5 MM MISC, USE TO take insulin FIVE TIMES DAILY, Disp: 200 each, Rfl: 5 .  omeprazole (PRILOSEC) 20 MG capsule, Take 1 capsule (20 mg total) by mouth daily., Disp: 30 capsule, Rfl: 11 .  ONETOUCH DELICA LANCETS FINE MISC, USE TO CHECK BLOOD SUGAR FOUR TIMES DAILY, Disp: 200 each, Rfl: 5 .  sulfaSALAzine (AZULFIDINE) 500 MG EC tablet, TAKE 2 TABLETS(1000 MG) BY MOUTH TWICE DAILY, Disp: 120 tablet, Rfl: 2  Review of Systems     Objective:   Physical Exam  Constitutional: She is oriented to person, place, and time. She appears well-developed and well-nourished. No distress.  HENT:  Head: Normocephalic and atraumatic.  Right Ear: External ear normal.  Left Ear: External ear normal.  Mouth/Throat: Oropharynx is clear and moist. No oropharyngeal exudate.  Inspiratory stridor - mild as she talks +  Eyes: Conjunctivae and EOM are normal. Pupils are equal, round, and reactive to light.  Right eye exhibits no discharge. Left eye exhibits no discharge. No scleral icterus.  Neck: Normal range of motion. Neck supple. No JVD present. No tracheal deviation present. No thyromegaly present.  Cardiovascular: Normal rate, regular rhythm, normal heart sounds and intact  distal pulses. Exam reveals no gallop and no friction rub.  No murmur heard. Pulmonary/Chest: Effort normal and breath sounds normal. No respiratory distress. She has no wheezes. She has no rales. She exhibits no tenderness.  Abdominal: Soft. Bowel sounds are normal. She exhibits no distension and no mass. There is no tenderness. There is no rebound and no guarding.  Musculoskeletal: Normal range of motion. She exhibits no edema or tenderness.  Lymphadenopathy:    She has no cervical adenopathy.  Neurological: She is alert and oriented to person, place, and time. She has normal reflexes. No cranial nerve deficit. She exhibits normal muscle tone. Coordination normal.  Skin: Skin is warm and dry. No rash noted. She is not diaphoretic. No erythema. No pallor.  Psychiatric: She has a normal mood and affect. Her behavior is normal. Judgment and thought content normal.  Vitals reviewed.  Vitals:   12/15/17 1115  BP: 140/90  Pulse: (!) 108  SpO2: 96%  Weight: 195 lb (88.5 kg)  Height: 5\' 6"  (1.676 m)    Estimated body mass index is 31.47 kg/m as calculated from the following:   Height as of this encounter: 5\' 6"  (1.676 m).   Weight as of this encounter: 195 lb (88.5 kg).     Assessment:       ICD-10-CM   1. Pulmonary emphysema, unspecified emphysema type ([place]) J43.9   2. Dyspnea, unspecified type R06.00   3. Inspiratory stridor R06.1        Plan:     Pulmonary emphysema, unspecified emphysema type ([place]) - glad better  -continue symbicort daily - Please talk to PCP Quay Burow, [name], MD -  and ensure you get  shingarix vaccine (reportedly inactivated vaccine)    Dyspnea, unspecified type Inspiratory  stridor - glad ENT showed no stridor - will hold off [facility] referral for stent placement because this is not bothering you much  Followup - 6 months or sooner   Dr. Brand Males, M.D., [facility].C.P Pulmonary and Critical Care Medicine Staff Physician, [place] Director - Interstitial Lung Disease  Program  Pulmonary [place] at [place], [place], [address]  Pager: [phone], If no answer or between  15:00h - 7:00h: call [phone] Telephone: [phone]

## 2017-12-18 ENCOUNTER — Ambulatory Visit: Payer: Medicare Other | Admitting: Internal Medicine

## 2017-12-18 ENCOUNTER — Other Ambulatory Visit: Payer: Self-pay | Admitting: Internal Medicine

## 2017-12-23 ENCOUNTER — Encounter: Payer: Self-pay | Admitting: Internal Medicine

## 2017-12-25 ENCOUNTER — Telehealth: Payer: Self-pay | Admitting: Rheumatology

## 2017-12-25 ENCOUNTER — Other Ambulatory Visit: Payer: Self-pay | Admitting: Rheumatology

## 2017-12-25 NOTE — Telephone Encounter (Signed)
Last Visit: 09/08/17  Next Visit: 02/07/18 Labs: 09/08/17 Stable  Left message to advise patient she is due for labs  Okay to refill 30 day supply per Dr. Estanislado Pandy

## 2017-12-25 NOTE — Telephone Encounter (Signed)
Patient called stating that she will be getting her labwork done at [facility] on the March 19.

## 2018-01-11 ENCOUNTER — Inpatient Hospital Stay: Payer: Medicare Other | Attending: Internal Medicine

## 2018-01-11 ENCOUNTER — Other Ambulatory Visit: Payer: Self-pay | Admitting: Medical Oncology

## 2018-01-11 ENCOUNTER — Ambulatory Visit (HOSPITAL_COMMUNITY)
Admission: RE | Admit: 2018-01-11 | Discharge: 2018-01-11 | Disposition: A | Discharge status: Home / Self Care | Payer: Medicare Other | Source: Ambulatory Visit | Attending: Oncology | Admitting: Oncology

## 2018-01-11 DIAGNOSIS — J439 Emphysema, unspecified: Secondary | ICD-10-CM | POA: Insufficient documentation

## 2018-01-11 DIAGNOSIS — I700 Atherosclerosis of aorta: Secondary | ICD-10-CM | POA: Diagnosis not present

## 2018-01-11 DIAGNOSIS — J984 Other disorders of lung: Secondary | ICD-10-CM | POA: Insufficient documentation

## 2018-01-11 DIAGNOSIS — G620 Drug-induced polyneuropathy: Secondary | ICD-10-CM | POA: Diagnosis not present

## 2018-01-11 DIAGNOSIS — Z9221 Personal history of antineoplastic chemotherapy: Secondary | ICD-10-CM | POA: Diagnosis not present

## 2018-01-11 DIAGNOSIS — I10 Essential (primary) hypertension: Secondary | ICD-10-CM | POA: Insufficient documentation

## 2018-01-11 DIAGNOSIS — C3411 Malignant neoplasm of upper lobe, right bronchus or lung: Secondary | ICD-10-CM | POA: Diagnosis present

## 2018-01-11 DIAGNOSIS — Z923 Personal history of irradiation: Secondary | ICD-10-CM | POA: Diagnosis not present

## 2018-01-11 DIAGNOSIS — R197 Diarrhea, unspecified: Secondary | ICD-10-CM | POA: Insufficient documentation

## 2018-01-11 DIAGNOSIS — C3490 Malignant neoplasm of unspecified part of unspecified bronchus or lung: Secondary | ICD-10-CM | POA: Insufficient documentation

## 2018-01-11 DIAGNOSIS — J9819 Other pulmonary collapse: Secondary | ICD-10-CM | POA: Insufficient documentation

## 2018-01-11 DIAGNOSIS — Z85118 Personal history of other malignant neoplasm of bronchus and lung: Secondary | ICD-10-CM | POA: Diagnosis not present

## 2018-01-11 LAB — CMP (CANCER CENTER ONLY)
ALT: 17 U/L (ref 0–55)
ANION GAP: 8 (ref 3–11)
AST: 15 U/L (ref 5–34)
Albumin: 3.7 g/dL (ref 3.5–5.0)
Alkaline Phosphatase: 193 U/L — ABNORMAL HIGH (ref 40–150)
BUN: 13 mg/dL (ref 7–26)
CHLORIDE: 106 mmol/L (ref 98–109)
CO2: 29 mmol/L (ref 22–29)
Calcium: 9.7 mg/dL (ref 8.4–10.4)
Creatinine: 0.89 mg/dL (ref 0.60–1.10)
Glucose, Bld: 112 mg/dL (ref 70–140)
POTASSIUM: 4.1 mmol/L (ref 3.5–5.1)
Sodium: 143 mmol/L (ref 136–145)
Total Bilirubin: 0.5 mg/dL (ref 0.2–1.2)
Total Protein: 6.9 g/dL (ref 6.4–8.3)

## 2018-01-11 LAB — CBC WITH DIFFERENTIAL (CANCER CENTER ONLY)
Basophils Absolute: 0.1 10*3/uL (ref 0.0–0.1)
Basophils Relative: 1 %
Eosinophils Absolute: 0.2 10*3/uL (ref 0.0–0.5)
Eosinophils Relative: 3 %
HCT: 43.6 % (ref 34.8–46.6)
Hemoglobin: 13.7 g/dL (ref 11.6–15.9)
LYMPHS ABS: 1.2 10*3/uL (ref 0.9–3.3)
LYMPHS PCT: 17 %
MCH: 28.2 pg (ref 25.1–34.0)
MCHC: 31.4 g/dL — ABNORMAL LOW (ref 31.5–36.0)
MCV: 89.9 fL (ref 79.5–101.0)
Monocytes Absolute: 0.6 10*3/uL (ref 0.1–0.9)
Monocytes Relative: 9 %
NEUTROS PCT: 70 %
Neutro Abs: 4.8 10*3/uL (ref 1.5–6.5)
Platelet Count: 176 10*3/uL (ref 145–400)
RBC: 4.85 MIL/uL (ref 3.70–5.45)
RDW: 16.7 % — ABNORMAL HIGH (ref 11.2–14.5)
WBC: 7 10*3/uL (ref 3.9–10.3)

## 2018-01-11 MED ORDER — IOPAMIDOL (ISOVUE-300) INJECTION 61%
INTRAVENOUS | Status: AC
  Filled 2018-01-11: qty 75

## 2018-01-11 MED ORDER — IOPAMIDOL (ISOVUE-300) INJECTION 61%
75.0000 mL | Freq: Once | INTRAVENOUS | Status: AC | PRN
  Administered 2018-01-11: 75 mL via INTRAVENOUS

## 2018-01-16 ENCOUNTER — Inpatient Hospital Stay (HOSPITAL_BASED_OUTPATIENT_CLINIC_OR_DEPARTMENT_OTHER): Payer: Medicare Other | Admitting: Internal Medicine

## 2018-01-16 ENCOUNTER — Encounter: Payer: Self-pay | Admitting: Internal Medicine

## 2018-01-16 ENCOUNTER — Telehealth: Payer: Self-pay

## 2018-01-16 DIAGNOSIS — Z923 Personal history of irradiation: Secondary | ICD-10-CM | POA: Diagnosis not present

## 2018-01-16 DIAGNOSIS — Z9221 Personal history of antineoplastic chemotherapy: Secondary | ICD-10-CM

## 2018-01-16 DIAGNOSIS — G620 Drug-induced polyneuropathy: Secondary | ICD-10-CM | POA: Diagnosis not present

## 2018-01-16 DIAGNOSIS — I10 Essential (primary) hypertension: Secondary | ICD-10-CM

## 2018-01-16 DIAGNOSIS — Z85118 Personal history of other malignant neoplasm of bronchus and lung: Secondary | ICD-10-CM

## 2018-01-16 DIAGNOSIS — C3490 Malignant neoplasm of unspecified part of unspecified bronchus or lung: Secondary | ICD-10-CM | POA: Diagnosis not present

## 2018-01-16 DIAGNOSIS — R197 Diarrhea, unspecified: Secondary | ICD-10-CM | POA: Diagnosis not present

## 2018-01-16 NOTE — Telephone Encounter (Signed)
Printed avs and calender of upcoming appointment. Per 3/19 los patient requested following week due to preplanned obligations.

## 2018-01-16 NOTE — Progress Notes (Signed)
[place] Telephone:([phone]) [phone]   [phone]  OFFICE PROGRESS NOTE  Binnie Rail, MD [place] [address]  DIAGNOSIS: Squamous cell lung cancer  Primary site: Lung (Right)  Staging method: AJCC 7th Edition  Clinical: Stage IIIB (T3, N3, M0)  Summary: Stage IIIB (T3, N3, M0)   PRIOR THERAPY:  1) Concurrent chemoradiation with weekly chemotherapy in the form of carboplatin for AUC of 2 and paclitaxel 45 mg/m2. Status post 7 week of treatment. Last dose was given 01/27/2014 no significant response in her disease.  2) Consolidation chemotherapy with carboplatin for AUC of 5 and paclitaxel 175 mg/M2 every 3 weeks with Neulasta support. First dose on 04/15/2014. Status post 3 cycles. Carboplatin was discontinued starting cycle #2 secondary to hypersensitivity reaction. 3) Immunotherapy with Opdivo (Nivolumab) 3mg /kg given every 2 weeks. Status post 33 cycles discontinued secondary to intolerance.  CURRENT THERAPY: Observation.  INTERVAL HISTORY: Megan [name] 77 y.o. female returns to the clinic today for follow-up visit.  The patient is feeling fine today with no specific complaints.  She continues to gain weight and she gained almost 70 pounds since completion of her concurrent chemoradiation after initial loss of 50 pounds.  She denied having any current chest pain, shortness of breath, cough or hemoptysis.  She denied having any fever or chills.  She mentions that her blood pressure is much better at home but she is anxious about the scan results today.  She had repeat CT scan of the chest performed recently and she is here for evaluation and discussion of her scan results.  MEDICAL HISTORY: Past Medical History:  Diagnosis Date  . Cancer ([place]) dx'd 09/2013   Lung ca  . Concussion 2018  . Diabetes ([place]) 09/08/2016   type I diabetic patient reported on 09/13/16   . Diabetes mellitus    insulin  . Diabetic neuropathy ([place])  09/08/2016  . Fall 2018  . GERD (gastroesophageal reflux disease)    no meds for  . History of colitis   . History of hiatal hernia   . Hx of radiation therapy 12/16/13-01/30/14   lung 66Gy  . Hypertension   . Malignant neoplasm of right upper lobe of lung ([place]) 11/28/2013  . Neuropathy   . Psoriasis   . Psoriatic arthritis ([place]) 09/08/2016  . Rheumatoid arthritis ([place])   . Shortness of breath dyspnea    with exertion    ALLERGIES:  is allergic to carboplatin; remicade [infliximab]; ciprofloxacin; and hydromorphone.  MEDICATIONS:  Current Outpatient Medications  Medication Sig Dispense Refill  . amLODipine (NORVASC) 2.5 MG tablet Take 1 tablet (2.5 mg total) by mouth daily. 90 tablet 3  . augmented betamethasone dipropionate (DIPROLENE-AF) 0.05 % ointment APP AA BID PRN  1  . B-D UF III MINI PEN NEEDLES 31G X 5 MM MISC USE TO CHECK SUGAR FIVE TIMES DAILY 100 each 0  . budesonide-formoterol (SYMBICORT) 80-4.5 MCG/ACT inhaler Inhale 2 puffs into the lungs 2 (two) times daily. 1 Inhaler 6  . Continuous Blood Gluc Receiver (FREESTYLE LIBRE 14 DAY READER) DEVI 1 Device by Does not apply route daily. 1 Device 0  . Continuous Blood Gluc Sensor (FREESTYLE LIBRE 14 DAY SENSOR) MISC Inject 1 Device into the skin every 14 (fourteen) days. 3 each 2  . DULoxetine (CYMBALTA) 60 MG capsule Take 1 capsule (60 mg total) by mouth 2 (two) times daily. 60 capsule 12  . glucose blood test strip Use as  instructed 4x a day - Verio 200 each 5  . Insulin Detemir (LEVEMIR FLEXTOUCH) 100 UNIT/ML Pen Inject 20 units in the morning, and 25 units in the pm. 15 pen 3  . insulin lispro (HUMALOG KWIKPEN) 100 UNIT/ML KiwkPen ADMINISTER 18 TO 28 UNITS UNDER THE SKIN THREE TIMES DAILY 15 pen 3  . Insulin Pen Needle (B-D UF III MINI PEN NEEDLES) 31G X 5 MM MISC USE TO take insulin FIVE TIMES DAILY 200 each 5  . omeprazole (PRILOSEC) 20 MG capsule Take 1 capsule (20 mg total) by mouth daily. 30 capsule 11  . ONETOUCH DELICA  LANCETS FINE MISC USE TO CHECK BLOOD SUGAR FOUR TIMES DAILY 200 each 5  . ONETOUCH DELICA LANCETS FINE MISC TEST BLOOD SUGAR FIVE TIMES DAILY AS NEEDED 200 each 0  . ONETOUCH VERIO test strip USE FIVE TIMES DAILY AS DIRECTED 400 each 0  . sulfaSALAzine (AZULFIDINE) 500 MG EC tablet TAKE 2 TABLETS(1000 MG) BY MOUTH TWICE DAILY 120 tablet 0   No current facility-administered medications for this visit.     SURGICAL HISTORY:  Past Surgical History:  Procedure Laterality Date  . ABDOMINAL HYSTERECTOMY    . ABDOMINAL SURGERY    . BALLOON DILATION N/A 12/12/2014   Procedure: BALLOON DILATION;  Surgeon: Inda Castle, MD;  Location: [facility];  Service: Endoscopy;  Laterality: N/A;  . CHOLECYSTECTOMY    . COLONOSCOPY N/A 02/07/2016   Procedure: COLONOSCOPY;  Surgeon: Doran Stabler, MD;  [facility];  Service: Endoscopy;  Laterality: N/A;  . DILATION AND CURETTAGE OF UTERUS    . ESOPHAGOGASTRODUODENOSCOPY N/A 12/12/2014   Procedure: ESOPHAGOGASTRODUODENOSCOPY (EGD);  Surgeon: Inda Castle, MD;  Location: [facility];  Service: Endoscopy;  Laterality: N/A;  with dilation  . ESOPHAGOGASTRODUODENOSCOPY (EGD) WITH PROPOFOL N/A 10/09/2014   Procedure: ESOPHAGOGASTRODUODENOSCOPY (EGD) WITH PROPOFOL;  Surgeon: Inda Castle, MD;  Location: [place];  Service: Endoscopy;  Laterality: N/A;  . ESOPHAGOGASTRODUODENOSCOPY (EGD) WITH PROPOFOL N/A 09/19/2016   Procedure: ESOPHAGOGASTRODUODENOSCOPY (EGD) WITH PROPOFOL;  Surgeon: Doran Stabler, MD;  Location: [facility] ENDOS[facility];  Service: Gastroenterology;  Laterality: N/A;  with savary dil tt  . FRACTURE SURGERY    . ORIF ANKLE FRACTURE Right 07/04/2015   Procedure: OPEN REDUCTION INTERNAL FIXATION (ORIF) ANKLE FRACTURE;  Surgeon: Newt Minion, MD;  Location: [place];  Service: Orthopedics;  Laterality: Right;  OPEN REDUCTION, INTERNAL FIXATION OF RIGHT ANKLE FRACTURE.   [name] ORIF ANKLE FRACTURE Left 02/10/2016   Procedure: OPEN REDUCTION  INTERNAL FIXATION (ORIF) ANKLE FRACTURE;  Surgeon: Newt Minion, MD;  Location: [place];  Service: Orthopedics;  Laterality: Left;  . SAVORY DILATION N/A 10/09/2014   Procedure: SAVORY DILATION;  Surgeon: Inda Castle, MD;  Location: [facility];  Service: Endoscopy;  Laterality: N/A;  . SAVORY DILATION N/A 09/19/2016   Procedure: SAVORY DILATION;  Surgeon: Doran Stabler, MD;  Location: [facility];  Service: Gastroenterology;  Laterality: N/A;  . TONSILLECTOMY      REVIEW OF SYSTEMS:  A comprehensive review of systems was negative.   PHYSICAL EXAMINATION: General appearance: alert, cooperative and no distress Head: Normocephalic, without obvious abnormality, atraumatic Neck: no adenopathy, no JVD, supple, symmetrical, trachea midline and thyroid not enlarged, symmetric, no tenderness/mass/nodules Lymph nodes: Cervical, supraclavicular, and axillary nodes normal. Resp: clear to auscultation bilaterally Back: symmetric, no curvature. ROM normal. No CVA tenderness. Cardio: regular rate and rhythm, S1, S2 normal, no murmur, click, rub or gallop GI: soft, non-tender;  bowel sounds normal; no masses,  no organomegaly Extremities: extremities normal, atraumatic, no cyanosis or edema    ECOG PERFORMANCE STATUS: 1 - Symptomatic but completely ambulatory  Blood pressure (!) 180/92, pulse (!) 105, temperature 97.6 F (36.4 C), temperature source Oral, resp. rate 20, height 5\' 6"  (1.676 m), weight 192 lb 8 oz (87.3 kg), SpO2 97 %.  LABORATORY DATA: Lab Results  Component Value Date   WBC 7.0 01/11/2018   HGB 13.8 09/08/2017   HCT 43.6 01/11/2018   MCV 89.9 01/11/2018   PLT 176 01/11/2018      Chemistry      Component Value Date/Time   NA 143 01/11/2018 1104   NA 141 09/08/2017 1213   K 4.1 01/11/2018 1104   K 4.1 09/08/2017 1213   CL 106 01/11/2018 1104   CO2 29 01/11/2018 1104   CO2 28 09/08/2017 1213   BUN 13 01/11/2018 1104   BUN 13.8 09/08/2017 1213   CREATININE 0.89  01/11/2018 1104   CREATININE 0.9 09/08/2017 1213   GLU 176 03/07/2016      Component Value Date/Time   CALCIUM 9.7 01/11/2018 1104   CALCIUM 9.4 09/08/2017 1213   ALKPHOS 193 (H) 01/11/2018 1104   ALKPHOS 182 (H) 09/08/2017 1213   AST 15 01/11/2018 1104   AST 15 09/08/2017 1213   ALT 17 01/11/2018 1104   ALT 17 09/08/2017 1213   BILITOT 0.5 01/11/2018 1104   BILITOT 0.45 09/08/2017 1213       RADIOGRAPHIC STUDIES: Ct Chest W Contrast  Result Date: 01/11/2018 CLINICAL DATA:  Restaging lung cancer. EXAM: CT CHEST WITH CONTRAST TECHNIQUE: Multidetector CT imaging of the chest was performed during intravenous contrast administration. CONTRAST:  63mL ISOVUE-300 IOPAMIDOL (ISOVUE-300) INJECTION 61% COMPARISON:  09/08/2017 FINDINGS: Cardiovascular: The heart is normal in size. No pericardial effusion. Small amount of fluid in the pericardial recesses. Stable mild tortuosity and calcification of the thoracic aorta. No dissection. Stable coronary artery calcifications. Mediastinum/Nodes: Stable soft tissue density surrounding the esophagus. This is most likely radiation change. It appears unchanged looking back at multiple prior recent CT examinations. No new or progressive mediastinal or hilar lymph nodes. Lungs/Pleura: Chronic right middle lobe and right upper lobe atelectasis with marked narrowing of the upper and middle lobe bronchi. No findings suspicious for recurrent tumor. No new pulmonary nodules to suggest pulmonary metastatic disease. Stable calcified granuloma in the left lower lobe. No acute pulmonary findings and no pleural effusion. Stable underlying emphysematous changes and pulmonary scarring. Upper Abdomen: No significant upper abdominal findings. No worrisome hepatic lesions or adrenal gland lesions. Stable hepatic cysts. Musculoskeletal: No chest wall or breast masses. No supraclavicular or axillary adenopathy. Small scattered lymph nodes are stable. Stable left thyroid gland nodule.  The bony structures are unremarkable. IMPRESSION: 1. Stable CT appearance of the chest when compared to recent prior studies. Chronic right upper lobe and right middle lobe collapse with markedly narrowed bronchi. No findings to suggest recurrent tumor, adenopathy or metastatic disease elsewhere. 2. Stable underlying emphysematous changes and pulmonary scarring. Aortic Atherosclerosis (ICD10-I70.0) and Emphysema (ICD10-J43.9). Electronically Signed   By: Marijo Sanes M.D.   On: 01/11/2018 15:53    ASSESSMENT AND PLAN:  This is a very pleasant 77 years old white female with metastatic non-small cell lung cancer status post a course of concurrent chemoradiation as well as consolidation chemotherapy discontinued secondary to intolerance and significant neuropathy. The patient was treated with immunotherapy with Nivolumab status post 33 cycles and tolerated the treatment  well but it was discontinued secondary to chemotherapy-induced colitis and diarrhea. The patient is currently on observation and she is feeling very well. Her recent CT scan of the chest showed no concerning findings for disease progression. I discussed the scan results with the patient and recommended for her to continue in observation with repeat CT scan of the chest in 6 months. For hypertension, I strongly recommend for the patient to take her blood pressure medication as prescribed and she will call her primary care physician for adjustment of her medication if needed. She was advised to call immediately if she has any concerning symptoms in the interval. The patient voices understanding of current disease status and treatment options and is in agreement with the current care plan. All questions were answered. The patient knows to call the clinic with any problems, questions or concerns. We can certainly see the patient much sooner if necessary. I spent 10 minutes counseling the patient face to face. The total time spent in the  appointment was 15 minutes.  Disclaimer: This note was dictated with voice recognition software. Similar sounding words can inadvertently be transcribed and may not be corrected upon review.

## 2018-01-17 NOTE — Progress Notes (Signed)
Subjective:    Patient ID: Megan [name], female    DOB: 03-13-1941, 77 y.o.   MRN: [id number]  HPI The patient is here for an acute visit for elevated BP.  Hypertension: Her BP has been elevated recently and yesterday it was high at her oncologist's office.  She is taking her medication daily. She is compliant with a low sodium diet.  She denies chest pain, palpitations, shortness of breath and regular headaches.  She does get some mild leg swelling at times.  She is not exercising regularly.  She does not monitor her blood pressure at home.     Medications and allergies reviewed with patient and updated if appropriate.  Patient Active Problem List   Diagnosis Date Noted  . Carpal tunnel syndrome on right 11/30/2017  . Cough 08/22/2017  . Primary osteoarthritis of both hands 03/08/2017  . High risk medication use 03/07/2017  . Poor balance 02/07/2017  . Subarachnoid bleed ([place]) 01/11/2017  . Psoriatic arthropathy ([place]) 09/08/2016  . Essential hypertension 09/08/2016  . Diabetic neuropathy ([place]) 09/08/2016  . LADA (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), managed as type 1 ([place]) 05/04/2016  . Syncope 03/25/2016  . Malnutrition of moderate degree 02/29/2016  . Diabetic ketoacidosis with coma associated with diabetes mellitus due to underlying condition ([place])   . Abnormal finding on GI tract imaging   . Diarrhea   . Hematochezia   . RLS (restless legs syndrome) 12/11/2015  . Liver dysfunction 08/11/2015  . Lumbago 05/06/2015  . Peripheral edema 04/14/2015  . Psoriasis 04/14/2015  . Encounter for antineoplastic immunotherapy 04/06/2015  . Emphysema of lung ([place]) 11/26/2014  . Radiation-induced esophageal stricture 10/01/2014  . Tachycardia 08/27/2014  . Palliative care encounter 08/21/2014  . DNR (do not resuscitate) discussion 08/21/2014  . Cancer associated pain 07/29/2014  . Malignant cachexia ([place]) 07/29/2014  . Lung collapse 07/29/2014  . Fatigue 07/18/2014  .  Neuropathy due to chemotherapeutic drug ([place]) 07/18/2014  . Leg cramps 07/18/2014  . Hypoalbuminemia 07/18/2014  . Thrombocytopenia, unspecified ([place]) 07/18/2014  . Dyspnea 04/28/2014  . Radiation esophagitis 04/10/2014  . Right-sided chest wall pain 04/01/2014  . Cancer of middle lobe of lung ([place]) 01/17/2014  . Malignant neoplasm of right upper lobe of lung ([place]) 11/28/2013    Current Outpatient Medications on File Prior to Visit  Medication Sig Dispense Refill  . amLODipine (NORVASC) 2.5 MG tablet Take 1 tablet (2.5 mg total) by mouth daily. 90 tablet 3  . augmented betamethasone dipropionate (DIPROLENE-AF) 0.05 % ointment APP AA BID PRN  1  . B-D UF III MINI PEN NEEDLES 31G X 5 MM MISC USE TO CHECK SUGAR FIVE TIMES DAILY 100 each 0  . budesonide-formoterol (SYMBICORT) 80-4.5 MCG/ACT inhaler Inhale 2 puffs into the lungs 2 (two) times daily. 1 Inhaler 6  . DULoxetine (CYMBALTA) 60 MG capsule Take 1 capsule (60 mg total) by mouth 2 (two) times daily. 60 capsule 12  . glucose blood test strip Use as instructed 4x a day - Verio 200 each 5  . Insulin Detemir (LEVEMIR FLEXTOUCH) 100 UNIT/ML Pen Inject 20 units in the morning, and 25 units in the pm. 15 pen 3  . insulin lispro (HUMALOG KWIKPEN) 100 UNIT/ML KiwkPen ADMINISTER 18 TO 28 UNITS UNDER THE SKIN THREE TIMES DAILY 15 pen 3  . Insulin Pen Needle (B-D UF III MINI PEN NEEDLES) 31G X 5 MM MISC USE TO take insulin FIVE TIMES DAILY 200 each 5  . omeprazole (PRILOSEC) 20  MG capsule Take 1 capsule (20 mg total) by mouth daily. 30 capsule 11  . ONETOUCH DELICA LANCETS FINE MISC USE TO CHECK BLOOD SUGAR FOUR TIMES DAILY 200 each 5  . ONETOUCH DELICA LANCETS FINE MISC TEST BLOOD SUGAR FIVE TIMES DAILY AS NEEDED 200 each 0  . ONETOUCH VERIO test strip USE FIVE TIMES DAILY AS DIRECTED 400 each 0  . sulfaSALAzine (AZULFIDINE) 500 MG EC tablet TAKE 2 TABLETS(1000 MG) BY MOUTH TWICE DAILY 120 tablet 0   No current facility-administered medications  on file prior to visit.     Past Medical History:  Diagnosis Date  . Cancer ([place]) dx'd 09/2013   Lung ca  . Concussion 2018  . Diabetes ([place]) 09/08/2016   type I diabetic patient reported on 09/13/16   . Diabetes mellitus    insulin  . Diabetic neuropathy ([place]) 09/08/2016  . Fall 2018  . GERD (gastroesophageal reflux disease)    no meds for  . History of colitis   . History of hiatal hernia   . Hx of radiation therapy 12/16/13-01/30/14   lung 66Gy  . Hypertension   . Malignant neoplasm of right upper lobe of lung ([place]) 11/28/2013  . Neuropathy   . Psoriasis   . Psoriatic arthritis ([place]) 09/08/2016  . Rheumatoid arthritis ([place])   . Shortness of breath dyspnea    with exertion    Past Surgical History:  Procedure Laterality Date  . ABDOMINAL HYSTERECTOMY    . ABDOMINAL SURGERY    . BALLOON DILATION N/A 12/12/2014   Procedure: BALLOON DILATION;  Surgeon: Inda Castle, MD;  Location: [facility];  Service: Endoscopy;  Laterality: N/A;  . CHOLECYSTECTOMY    . COLONOSCOPY N/A 02/07/2016   Procedure: COLONOSCOPY;  Surgeon: Doran Stabler, MD;  Location[facility];  Service: Endoscopy;  Laterality: N/A;  . DILATION AND CURETTAGE OF UTERUS    . ESOPHAGOGASTRODUODENOSCOPY N/A 12/12/2014   Procedure: ESOPHAGOGASTRODUODENOSCOPY (EGD);  Surgeon: Inda Castle, MD;  Location: [facility];  Service: Endoscopy;  Laterality: N/A;  with dilation  . ESOPHAGOGASTRODUODENOSCOPY (EGD) WITH PROPOFOL N/A 10/09/2014   Procedure: ESOPHAGOGASTRODUODENOSCOPY (EGD) WITH PROPOFOL;  Surgeon: Inda Castle, MD;  Location: [place];  Service: Endoscopy;  Laterality: N/A;  . ESOPHAGOGASTRODUODENOSCOPY (EGD) WITH PROPOFOL N/A 09/19/2016   Procedure: ESOPHAGOGASTRODUODENOSCOPY (EGD) WITH PROPOFOL;  Surgeon: Doran Stabler, MD;  Location: [facility];  Service: Gastroenterology;  Laterality: N/A;  with savary dil tt  . FRACTURE SURGERY    . ORIF ANKLE FRACTURE Right 07/04/2015   Procedure: OPEN  REDUCTION INTERNAL FIXATION (ORIF) ANKLE FRACTURE;  Surgeon: Newt Minion, MD;  Location: [place];  Service: Orthopedics;  Laterality: Right;  OPEN REDUCTION, INTERNAL FIXATION OF RIGHT ANKLE FRACTURE.   [name] ORIF ANKLE FRACTURE Left 02/10/2016   Procedure: OPEN REDUCTION INTERNAL FIXATION (ORIF) ANKLE FRACTURE;  Surgeon: Newt Minion, MD;  Location: [place];  Service: Orthopedics;  Laterality: Left;  . SAVORY DILATION N/A 10/09/2014   Procedure: SAVORY DILATION;  Surgeon: Inda Castle, MD;  Location: [place];  Service: Endoscopy;  Laterality: N/A;  . SAVORY DILATION N/A 09/19/2016   Procedure: SAVORY DILATION;  Surgeon: Doran Stabler, MD;  Location: [facility];  Service: Gastroenterology;  Laterality: N/A;  . TONSILLECTOMY      Social History   Socioeconomic History  . Marital status: Divorced    Spouse name: Not on file  . Number of children: 0  . Years of education: MA  .  Highest education level: Master's degree (e.g., MA, MS, MEng, MEd, MSW, MBA)  Occupational History  . Occupation: Retired    Fish farm manager: OTHER  Social Needs  . Financial resource strain: Not on file  . Food insecurity:    Worry: Not on file    Inability: Not on file  . Transportation needs:    Medical: Not on file    Non-medical: Not on file  Tobacco Use  . Smoking status: Former Smoker    Packs/day: 3.00    Years: 45.00    Pack years: 135.00    Types: Cigarettes    Last attempt to quit: 12/05/1997    Years since quitting: 20.1  . Smokeless tobacco: Never Used  Substance and Sexual Activity  . Alcohol use: No    Alcohol/week: 0.0 oz    Comment: rarely   . Drug use: No  . Sexual activity: Not Currently  Lifestyle  . Physical activity:    Days per week: Not on file    Minutes per session: Not on file  . Stress: Not on file  Relationships  . Social connections:    Talks on phone: Not on file    Gets together: Not on file    Attends religious service: Not on file    Active member of club or  organization: Not on file    Attends meetings of clubs or organizations: Not on file    Relationship status: Not on file  Other Topics Concern  . Not on file  Social History Narrative   Patient lives at home alone.   Caffeine Use: 1 cups daily   Right handed    Family History  Problem Relation Age of Onset  . Other Mother   . Other Father        failure to thrive  . Hypertension Other   . Stroke Other   . Heart attack Other   . Hemachromatosis Other   . Rheum arthritis Unknown        both sets of grandparents and father    Review of Systems  Constitutional: Negative for chills and fever.  Respiratory: Positive for shortness of breath (chronic) and wheezing. Negative for cough.   Cardiovascular: Positive for leg swelling. Negative for chest pain and palpitations.  Neurological: Positive for headaches (rare). Negative for light-headedness.       Objective:   Vitals:   01/18/18 1103  BP: (!) 152/100  Pulse: (!) 108  Resp: 16  Temp: 97.6 F (36.4 C)  SpO2: 97%   Wt Readings from Last 3 Encounters:  01/18/18 193 lb (87.5 kg)  01/16/18 192 lb 8 oz (87.3 kg)  12/15/17 195 lb (88.5 kg)   Body mass index is 31.15 kg/m.   Physical Exam    Constitutional: Appears well-developed and well-nourished. No distress.  HENT:  Head: Normocephalic and atraumatic.  Neck: Neck supple. No tracheal deviation present. No thyromegaly present.  No cervical lymphadenopathy Cardiovascular: Normal rate, regular rhythm and normal heart sounds.   No murmur heard. No carotid bruit .  No edema Pulmonary/Chest: Effort normal and breath sounds normal. No respiratory distress. No has no wheezes. No rales.  Skin: Skin is warm and dry. Not diaphoretic.  Psychiatric: Normal mood and affect. Behavior is normal.       Assessment & Plan:    See Problem List for Assessment and Plan of chronic medical problems.

## 2018-01-18 ENCOUNTER — Encounter: Payer: Self-pay | Admitting: Internal Medicine

## 2018-01-18 ENCOUNTER — Ambulatory Visit: Payer: Medicare Other | Admitting: Internal Medicine

## 2018-01-18 VITALS — BP 152/100 | HR 108 | Temp 97.6°F | Resp 16 | Wt 193.0 lb

## 2018-01-18 DIAGNOSIS — I10 Essential (primary) hypertension: Secondary | ICD-10-CM

## 2018-01-18 MED ORDER — AMLODIPINE BESYLATE 5 MG PO TABS: 5.0000 mg | ORAL_TABLET | Freq: Every day | ORAL | 3 refills | Status: DC

## 2018-01-18 NOTE — Assessment & Plan Note (Signed)
Blood pressure not ideally controlled Discussed changes in medication and she would ideally like to only take 1 pill Currently taking amlodipine 2.5 mg daily-we will increase to 5 mg daily.  Discussed that this may cause a little increased swelling in her legs and if it does we can switch the medication if bothersome Daily monitor blood pressure Has follow-up in May to recheck blood pressure

## 2018-01-18 NOTE — Patient Instructions (Addendum)
  Medications reviewed and updated.  Changes include increasing amlodipine to 5 mg daily  Your prescription(s) have been submitted to your pharmacy. Please take as directed and contact our office if you believe you are having problem(s) with the medication(s).    Please followup in May as prescribed

## 2018-01-26 NOTE — Progress Notes (Deleted)
Office Visit Note  Patient: Megan [name]             Date of Birth: 11-Jan-1941           MRN: [id number]             PCP: Binnie Rail, MD Referring: Binnie Rail, MD Visit Date: 02/07/2018 Occupation: @GUAROCC @    Subjective:  No chief complaint on file.   History of Present Illness: Megan [name] is a 77 y.o. female ***   Activities of Daily Living:  Patient reports morning stiffness for *** {minute/hour:19697}.   Patient {ACTIONS;DENIES/REPORTS:21021675::"Denies"} nocturnal pain.  Difficulty dressing/grooming: {ACTIONS;DENIES/REPORTS:21021675::"Denies"} Difficulty climbing stairs: {ACTIONS;DENIES/REPORTS:21021675::"Denies"} Difficulty getting out of chair: {ACTIONS;DENIES/REPORTS:21021675::"Denies"} Difficulty using hands for taps, buttons, cutlery, and/or writing: {ACTIONS;DENIES/REPORTS:21021675::"Denies"}   No Rheumatology ROS completed.   PMFS History:  Patient Active Problem List   Diagnosis Date Noted  . Carpal tunnel syndrome on right 11/30/2017  . Cough 08/22/2017  . Primary osteoarthritis of both hands 03/08/2017  . High risk medication use 03/07/2017  . Poor balance 02/07/2017  . Subarachnoid bleed ([place]) 01/11/2017  . Psoriatic arthropathy ([place]) 09/08/2016  . Uncontrolled hypertension 09/08/2016  . Diabetic neuropathy ([place]) 09/08/2016  . LADA (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), managed as type 1 ([place]) 05/04/2016  . Syncope 03/25/2016  . Malnutrition of moderate degree 02/29/2016  . Diabetic ketoacidosis with coma associated with diabetes mellitus due to underlying condition ([facility])   . Abnormal finding on GI tract imaging   . Diarrhea   . Hematochezia   . RLS (restless legs syndrome) 12/11/2015  . Liver dysfunction 08/11/2015  . Lumbago 05/06/2015  . Peripheral edema 04/14/2015  . Psoriasis 04/14/2015  . Encounter for antineoplastic immunotherapy 04/06/2015  . Emphysema of lung ([place]) 11/26/2014  . Radiation-induced esophageal  stricture 10/01/2014  . Tachycardia 08/27/2014  . Palliative care encounter 08/21/2014  . DNR (do not resuscitate) discussion 08/21/2014  . Cancer associated pain 07/29/2014  . Malignant cachexia ([place]) 07/29/2014  . Lung collapse 07/29/2014  . Fatigue 07/18/2014  . Neuropathy due to chemotherapeutic drug ([place]) 07/18/2014  . Leg cramps 07/18/2014  . Hypoalbuminemia 07/18/2014  . Thrombocytopenia, unspecified ([place]) 07/18/2014  . Dyspnea 04/28/2014  . Radiation esophagitis 04/10/2014  . Right-sided chest wall pain 04/01/2014  . Cancer of middle lobe of lung ([place]) 01/17/2014  . Malignant neoplasm of right upper lobe of lung ([place]) 11/28/2013    Past Medical History:  Diagnosis Date  . Cancer ([place]) dx'd 09/2013   Lung ca  . Concussion 2018  . Diabetes ([place]) 09/08/2016   type I diabetic patient reported on 09/13/16   . Diabetes mellitus    insulin  . Diabetic neuropathy ([place]) 09/08/2016  . Fall 2018  . GERD (gastroesophageal reflux disease)    no meds for  . History of colitis   . History of hiatal hernia   . Hx of radiation therapy 12/16/13-01/30/14   lung 66Gy  . Hypertension   . Malignant neoplasm of right upper lobe of lung ([place]) 11/28/2013  . Neuropathy   . Psoriasis   . Psoriatic arthritis ([place]) 09/08/2016  . Rheumatoid arthritis ([place])   . Shortness of breath dyspnea    with exertion    Family History  Problem Relation Age of Onset  . Other Mother   . Other Father        failure to thrive  . Hypertension Other   . Stroke Other   . Heart attack Other   .  Hemachromatosis Other   . Rheum arthritis Unknown        both sets of grandparents and father   Past Surgical History:  Procedure Laterality Date  . ABDOMINAL HYSTERECTOMY    . ABDOMINAL SURGERY    . BALLOON DILATION N/A 12/12/2014   Procedure: BALLOON DILATION;  Surgeon: Inda Castle, MD;  Location: [facility];  Service: Endoscopy;  Laterality: N/A;  . CHOLECYSTECTOMY    . COLONOSCOPY N/A 02/07/2016    Procedure: COLONOSCOPY;  Surgeon: Doran Stabler, MD;  Location: [facility]COPY;  Service: Endoscopy;  Laterality: N/A;  . DILATION AND CURETTAGE OF UTERUS    . ESOPHAGOGASTRODUODENOSCOPY N/A 12/12/2014   Procedure: ESOPHAGOGASTRODUODENOSCOPY (EGD);  Surgeon: Inda Castle, MD;  Location: [facility];  Service: Endoscopy;  Laterality: N/A;  with dilation  . ESOPHAGOGASTRODUODENOSCOPY (EGD) WITH PROPOFOL N/A 10/09/2014   Procedure: ESOPHAGOGASTRODUODENOSCOPY (EGD) WITH PROPOFOL;  Surgeon: Inda Castle, MD;  Location: [facility];  Service: Endoscopy;  Laterality: N/A;  . ESOPHAGOGASTRODUODENOSCOPY (EGD) WITH PROPOFOL N/A 09/19/2016   Procedure: ESOPHAGOGASTRODUODENOSCOPY (EGD) WITH PROPOFOL;  Surgeon: Doran Stabler, MD;  Location: [facility] ENDOSCOPY;  Service: Gastroenterology;  Laterality: N/A;  with savary dil tt  . FRACTURE SURGERY    . ORIF ANKLE FRACTURE Right 07/04/2015   Procedure: OPEN REDUCTION INTERNAL FIXATION (ORIF) ANKLE FRACTURE;  Surgeon: Newt Minion, MD;  Location: [facility];  Service: Orthopedics;  Laterality: Right;  OPEN REDUCTION, INTERNAL FIXATION OF RIGHT ANKLE FRACTURE.   [name] ORIF ANKLE FRACTURE Left 02/10/2016   Procedure: OPEN REDUCTION INTERNAL FIXATION (ORIF) ANKLE FRACTURE;  Surgeon: Newt Minion, MD;  Location: [place];  Service: Orthopedics;  Laterality: Left;  . SAVORY DILATION N/A 10/09/2014   Procedure: SAVORY DILATION;  Surgeon: Inda Castle, MD;  Location: [facility];  Service: Endoscopy;  Laterality: N/A;  . SAVORY DILATION N/A 09/19/2016   Procedure: SAVORY DILATION;  Surgeon: Doran Stabler, MD;  Location: [facility];  Service: Gastroenterology;  Laterality: N/A;  . TONSILLECTOMY     Social History   Social History Narrative   Patient lives at home alone.   Caffeine Use: 1 cups daily   Right handed     Objective: Vital Signs: There were no vitals taken for this visit.   Physical Exam   Musculoskeletal Exam: ***  CDAI Exam: No CDAI exam  completed.    Investigation: No additional findings. CBC Latest Ref Rng & Units 01/11/2018 09/08/2017 05/08/2017  WBC 3.9 - 10.3 K/uL 7.0 7.1 8.1  Hemoglobin 11.6 - 15.9 g/dL - 13.8 13.2  Hematocrit 34.8 - 46.6 % 43.6 43.0 40.1  Platelets 145 - 400 K/uL 176 155 188   CMP Latest Ref Rng & Units 01/11/2018 09/08/2017 05/08/2017  Glucose 70 - 140 mg/dL 112 182(H) 95  BUN 7 - 26 mg/dL 13 13.8 16.8  Creatinine 0.60 - 1.10 mg/dL 0.89 0.9 0.8  Sodium 136 - 145 mmol/L 143 141 144  Potassium 3.5 - 5.1 mmol/L 4.1 4.1 3.6  Chloride 98 - 109 mmol/L 106 - -  CO2 22 - 29 mmol/L 29 28 28   Calcium 8.4 - 10.4 mg/dL 9.7 9.4 9.5  Total Protein 6.4 - 8.3 g/dL 6.9 7.1 6.8  Total Bilirubin 0.2 - 1.2 mg/dL 0.5 0.45 0.45  Alkaline Phos 40 - 150 U/L 193(H) 182(H) 161(H)  AST 5 - 34 U/L 15 15 20   ALT 0 - 55 U/L 17 17 22     Imaging: Ct Chest W Contrast  Result Date: 01/11/2018 CLINICAL DATA:  Restaging lung cancer. EXAM: CT CHEST WITH CONTRAST TECHNIQUE: Multidetector CT imaging of the chest was performed during intravenous contrast administration. CONTRAST:  87mL ISOVUE-300 IOPAMIDOL (ISOVUE-300) INJECTION 61% COMPARISON:  09/08/2017 FINDINGS: Cardiovascular: The heart is normal in size. No pericardial effusion. Small amount of fluid in the pericardial recesses. Stable mild tortuosity and calcification of the thoracic aorta. No dissection. Stable coronary artery calcifications. Mediastinum/Nodes: Stable soft tissue density surrounding the esophagus. This is most likely radiation change. It appears unchanged looking back at multiple prior recent CT examinations. No new or progressive mediastinal or hilar lymph nodes. Lungs/Pleura: Chronic right middle lobe and right upper lobe atelectasis with marked narrowing of the upper and middle lobe bronchi. No findings suspicious for recurrent tumor. No new pulmonary nodules to suggest pulmonary metastatic disease. Stable calcified granuloma in the left lower lobe. No acute  pulmonary findings and no pleural effusion. Stable underlying emphysematous changes and pulmonary scarring. Upper Abdomen: No significant upper abdominal findings. No worrisome hepatic lesions or adrenal gland lesions. Stable hepatic cysts. Musculoskeletal: No chest wall or breast masses. No supraclavicular or axillary adenopathy. Small scattered lymph nodes are stable. Stable left thyroid gland nodule. The bony structures are unremarkable. IMPRESSION: 1. Stable CT appearance of the chest when compared to recent prior studies. Chronic right upper lobe and right middle lobe collapse with markedly narrowed bronchi. No findings to suggest recurrent tumor, adenopathy or metastatic disease elsewhere. 2. Stable underlying emphysematous changes and pulmonary scarring. Aortic Atherosclerosis (ICD10-I70.0) and Emphysema (ICD10-J43.9). Electronically Signed   By: Marijo Sanes M.D.   On: 01/11/2018 15:53    Speciality Comments: No specialty comments available.    Procedures:  No procedures performed Allergies: Carboplatin; Remicade [infliximab]; Ciprofloxacin; and Hydromorphone   Assessment / Plan:     Visit Diagnoses: No diagnosis found.    Orders: No orders of the defined types were placed in this encounter.  No orders of the defined types were placed in this encounter.   Face-to-face time spent with patient was *** minutes. 50% of time was spent in counseling and coordination of care.  Follow-Up Instructions: No follow-ups on file.   Earnestine Mealing, CMA  Note - This record has been created using Editor, commissioning.  Chart creation errors have been sought, but may not always  have been located. Such creation errors do not reflect on  the standard of medical care.

## 2018-02-07 ENCOUNTER — Ambulatory Visit: Payer: Medicare Other | Admitting: Rheumatology

## 2018-02-20 ENCOUNTER — Ambulatory Visit: Payer: Medicare Other | Admitting: Internal Medicine

## 2018-02-20 ENCOUNTER — Encounter: Payer: Self-pay | Admitting: Internal Medicine

## 2018-02-20 VITALS — BP 108/72 | HR 116 | Ht 66.0 in | Wt 192.8 lb

## 2018-02-20 DIAGNOSIS — E139 Other specified diabetes mellitus without complications: Secondary | ICD-10-CM | POA: Diagnosis not present

## 2018-02-20 DIAGNOSIS — E1149 Type 2 diabetes mellitus with other diabetic neurological complication: Secondary | ICD-10-CM | POA: Diagnosis not present

## 2018-02-20 LAB — POCT GLYCOSYLATED HEMOGLOBIN (HGB A1C): HEMOGLOBIN A1C: 8.3

## 2018-02-20 MED ORDER — INSULIN DETEMIR 100 UNIT/ML FLEXPEN: PEN_INJECTOR | SUBCUTANEOUS | 3 refills | Status: DC

## 2018-02-20 MED ORDER — INSULIN LISPRO 100 UNIT/ML (KWIKPEN): PEN_INJECTOR | SUBCUTANEOUS | 3 refills | Status: DC

## 2018-02-20 MED ORDER — ONETOUCH VERIO VI STRP: ORAL_STRIP | 1 refills | Status: DC

## 2018-02-20 MED ORDER — ONETOUCH DELICA LANCETS FINE MISC: 5 refills | Status: DC

## 2018-02-20 NOTE — Progress Notes (Signed)
Patient ID: MILI PILTZ, female   DOB: 1941-08-21, 77 y.o.   MRN: [id number]  HPI: Megan [name] is a 77 y.o.-year-old female, returning for f/u for DM2, dx in 2013, and as LADA in 04/2016, insulin-dependent since 2014, uncontrolled, with complications (DKA, ? Related PN). Last visit  3 months ago.  Reviewed history: She has a complicated medical history, with a diagnosis of ulcerative colitis with significant diarrhea that caused dehydration in the past and episodes of DKA. She had 2 admissions for this in 02/26/2016 and 04/14/2016. While in rehabilitation, her CBGs dropped to 30s on her previous dose of insulin, therefore, her long-acting insulin was decreased.   She also has a history of lung cancer diagnosed 09/2013.  She had chemotherapy and radiation therapy for this.  Last hemoglobin A1c was: Lab Results  Component Value Date   HGBA1C 8.8 11/17/2017   HGBA1C 6.8 08/17/2017   HGBA1C 7.3 05/18/2017  12/30/2015: 12% 09/18/2015: 11.3% 06/17/2015: 7.5% 04/08/2015: 10.9% 09/10/2014: 8.5%  11/04/2013: 6.2%  Now on: - Levemir 20 units in am and 25 at bedtime - Humalog mealtime dose: 20-26 units before meals   Please do not skip the insulin if sugars are 70-100.   If the sugars are 60-70, take 1/2 of the Humalog dose.  If the sugars are <60, skip insulin with that meal  No Humalog at bedtime, unless sugars >300: take 5 units or >400: 8 units She had a SSI before, but was not using it.  Pt checks her sugars 4 times a day (she has neuropathy in R hand >> cannot use the glucometer well) -very fluctuating: - am:  68-323, 419, 536 >> 175-379, 438 >> 136, 188-355, 418 - 2h after b'fast: 171-528 >> n/c >> 59 >> n/c - before lunch:   67-316, 450 >> 100-281, 412 >> 73-262, 335 - 2h after lunch: 134-439 >> n/c >> 47 >> 79 >> 67 - before dinner:   85-364 >> 66, 130-528 >> 99, 166-400 - 2h after dinner: n/c >> 128-479 >> n/c  - bedtime: 88-336, 431, 510 >> 108-352, 430 >> 91,  119-340, 483 - nighttime: n/c >> 57 x1 >> n/c Lowest sugar was 47 >> 66 >> 67; she has hypoglycemia awareness in the 50s.  Highest sugar was 500s >> 528 >> 483  Pt's meals are: - Breakfast: Cereal or oatmeal with blueberries or eggs with sausage - Lunch: Sandwich and soup - Dinner: Meat, vegetables, small amount of starch - Snacks: 3-4 a day: Peanuts  -No CKD, last BUN/creatinine:  Lab Results  Component Value Date   BUN 13 01/11/2018   BUN 13.8 09/08/2017   CREATININE 0.89 01/11/2018   CREATININE 0.9 09/08/2017   -+ HL: last set of lipids: Lab Results  Component Value Date   CHOL 201 (H) 11/14/2016   HDL 57.40 11/14/2016   LDLCALC 128 (H) 11/14/2016   TRIG 79.0 11/14/2016   CHOLHDL 4 11/14/2016   - last eye exam was in 12/2017: No DR Dr. Lady Gary. -+ Numbness and tingling in her feet. She has PN from ChTx.  Off Lyrica, did not start Neurontin yet.  ROS: Constitutional: + weight gain/no weight loss, + fatigue, + subjective hyperthermia, no subjective hypothermia, + nocturia Eyes: + blurry vision, no xerophthalmia ENT: no sore throat, no nodules palpated in throat, no dysphagia, no odynophagia, no hoarseness Cardiovascular: no CP/+ SOB/no palpitations/no leg swelling Respiratory: no cough/+ SOB/+ wheezing Gastrointestinal: no N/no V/no D/no C/no acid reflux Musculoskeletal: no muscle aches/no joint  aches Skin: no rashes, no hair loss Neurological: no tremors/no numbness/no tingling/no dizziness  I reviewed pt's medications, allergies, PMH, social hx, family hx, and changes were documented in the history of present illness. Otherwise, unchanged from my initial visit note. She increased vesicare dose  Past Medical History:  Diagnosis Date  . Cancer ([place]) dx'd 09/2013   Lung ca  . Concussion 2018  . Diabetes ([place]) 09/08/2016   type I diabetic patient reported on 09/13/16   . Diabetes mellitus    insulin  . Diabetic neuropathy ([place]) 09/08/2016  . Fall 2018  . GERD  (gastroesophageal reflux disease)    no meds for  . History of colitis   . History of hiatal hernia   . Hx of radiation therapy 12/16/13-01/30/14   lung 66Gy  . Hypertension   . Malignant neoplasm of right upper lobe of lung ([place]) 11/28/2013  . Neuropathy   . Psoriasis   . Psoriatic arthritis ([place]) 09/08/2016  . Rheumatoid arthritis ([place])   . Shortness of breath dyspnea    with exertion   Past Surgical History:  Procedure Laterality Date  . ABDOMINAL HYSTERECTOMY    . ABDOMINAL SURGERY    . BALLOON DILATION N/A 12/12/2014   Procedure: BALLOON DILATION;  Surgeon: Inda Castle, MD;  Location: [facility];  Service: Endoscopy;  Laterality: N/A;  . CHOLECYSTECTOMY    . COLONOSCOPY N/A 02/07/2016   Procedure: COLONOSCOPY;  Surgeon: Doran Stabler, MD;  Location: [facility] ENDOSCOPY;  Service: Endoscopy;  Laterality: N/A;  . DILATION AND CURETTAGE OF UTERUS    . ESOPHAGOGASTRODUODENOSCOPY N/A 12/12/2014   Procedure: ESOPHAGOGASTRODUODENOSCOPY (EGD);  Surgeon: Inda Castle, MD;  Location: [facility];  Service: Endoscopy;  Laterality: N/A;  with dilation  . ESOPHAGOGASTRODUODENOSCOPY (EGD) WITH PROPOFOL N/A 10/09/2014   Procedure: ESOPHAGOGASTRODUODENOSCOPY (EGD) WITH PROPOFOL;  Surgeon: Inda Castle, MD;  Location: [facility];  Service: Endoscopy;  Laterality: N/A;  . ESOPHAGOGASTRODUODENOSCOPY (EGD) WITH PROPOFOL N/A 09/19/2016   Procedure: ESOPHAGOGASTRODUODENOSCOPY (EGD) WITH PROPOFOL;  Surgeon: Doran Stabler, MD;  Location: [facility];  Service: Gastroenterology;  Laterality: N/A;  with savary dil tt  . FRACTURE SURGERY    . ORIF ANKLE FRACTURE Right 07/04/2015   Procedure: OPEN REDUCTION INTERNAL FIXATION (ORIF) ANKLE FRACTURE;  Surgeon: Newt Minion, MD;  Location: [place];  Service: Orthopedics;  Laterality: Right;  OPEN REDUCTION, INTERNAL FIXATION OF RIGHT ANKLE FRACTURE.   [name] ORIF ANKLE FRACTURE Left 02/10/2016   Procedure: OPEN REDUCTION INTERNAL FIXATION (ORIF) ANKLE  FRACTURE;  Surgeon: Newt Minion, MD;  Location: [facility];  Service: Orthopedics;  Laterality: Left;  . SAVORY DILATION N/A 10/09/2014   Procedure: SAVORY DILATION;  Surgeon: Inda Castle, MD;  Location: [facility];  Service: Endoscopy;  Laterality: N/A;  . SAVORY DILATION N/A 09/19/2016   Procedure: SAVORY DILATION;  Surgeon: Doran Stabler, MD;  Location: [facility];  Service: Gastroenterology;  Laterality: N/A;  . TONSILLECTOMY     Social History   Social History  . Marital Status: Divorced    Spouse Name: N/A  . Number of Children: 0   Occupational History  . Retired Pharmacist, hospital Other   Social History Main Topics  . Smoking status: Former Smoker -- 3.00 packs/day for 45 years    Types: Cigarettes    Quit date: 12/05/1997  . Smokeless tobacco: Never Used  . Alcohol Use: No  . Drug Use: No   Social History Narrative   Patient lives at  home alone.   Caffeine Use: 2 cups daily   Current Outpatient Medications on File Prior to Visit  Medication Sig Dispense Refill  . amLODipine (NORVASC) 5 MG tablet Take 1 tablet (5 mg total) by mouth daily. 90 tablet 3  . augmented betamethasone dipropionate (DIPROLENE-AF) 0.05 % ointment APP AA BID PRN  1  . B-D UF III MINI PEN NEEDLES 31G X 5 MM MISC USE TO CHECK SUGAR FIVE TIMES DAILY 100 each 0  . budesonide-formoterol (SYMBICORT) 80-4.5 MCG/ACT inhaler Inhale 2 puffs into the lungs 2 (two) times daily. 1 Inhaler 6  . DULoxetine (CYMBALTA) 60 MG capsule Take 1 capsule (60 mg total) by mouth 2 (two) times daily. 60 capsule 12  . glucose blood test strip Use as instructed 4x a day - Verio 200 each 5  . Insulin Detemir (LEVEMIR FLEXTOUCH) 100 UNIT/ML Pen Inject 20 units in the morning, and 25 units in the pm. 15 pen 3  . insulin lispro (HUMALOG KWIKPEN) 100 UNIT/ML KiwkPen ADMINISTER 18 TO 28 UNITS UNDER THE SKIN THREE TIMES DAILY 15 pen 3  . Insulin Pen Needle (B-D UF III MINI PEN NEEDLES) 31G X 5 MM MISC USE TO take insulin FIVE TIMES  DAILY 200 each 5  . omeprazole (PRILOSEC) 20 MG capsule Take 1 capsule (20 mg total) by mouth daily. 30 capsule 11  . ONETOUCH DELICA LANCETS FINE MISC USE TO CHECK BLOOD SUGAR FOUR TIMES DAILY 200 each 5  . ONETOUCH DELICA LANCETS FINE MISC TEST BLOOD SUGAR FIVE TIMES DAILY AS NEEDED 200 each 0  . ONETOUCH VERIO test strip USE FIVE TIMES DAILY AS DIRECTED 400 each 0  . sulfaSALAzine (AZULFIDINE) 500 MG EC tablet TAKE 2 TABLETS(1000 MG) BY MOUTH TWICE DAILY 120 tablet 0   No current facility-administered medications on file prior to visit.    Allergies  Allergen Reactions  . Carboplatin Other (See Comments)    Neuropathy  . Remicade [Infliximab] Anaphylaxis  . Ciprofloxacin Rash  . Hydromorphone Rash   Family History  Problem Relation Age of Onset  . Other Mother   . Other Father        failure to thrive  . Hypertension Other   . Stroke Other   . Heart attack Other   . Hemachromatosis Other   . Rheum arthritis Unknown        both sets of grandparents and father   PE: BP 108/72   Pulse (!) 116   Ht 5\' 6"  (1.676 m)   Wt 192 lb 12.8 oz (87.5 kg)   SpO2 95%   BMI 31.12 kg/m  Wt Readings from Last 3 Encounters:  02/20/18 192 lb 12.8 oz (87.5 kg)  01/18/18 193 lb (87.5 kg)  01/16/18 192 lb 8 oz (87.3 kg)   Constitutional: overweight, in NAD Eyes: PERRLA, EOMI, no exophthalmos ENT: moist mucous membranes, no thyromegaly, no cervical lymphadenopathy Cardiovascular: tachycardia, RR, No MRG Respiratory: CTA B Gastrointestinal: abdomen soft, NT, ND, BS+ Musculoskeletal: no deformities, strength intact in all 4 Skin: moist, warm, no rashes Neurological: no tremor with outstretched hands, DTR normal in all 4  ASSESSMENT: 1. LADA, insulin-dependent, uncontrolled, with complications - DKA in the setting of dehydration (- PN) - this may be 2/2 ChTx.  Pulmonologist: Dr. Chase Caller Oncologist: Dr. Earlie Server Cardiologist: Dr. Sallyanne Kuster Ophthalmologist: Dr. Clifton James  2.   Peripheral neuropathy - Unclear if also from Diabetes  PLAN:  1. Patient with long-standing, poorly controlled LADA, on basal-bolus insulin regimen, with very fluctuating CBGs.  At last visit, we slightly increased her insulin doses.  HbA1c was higher, at 8.8%. - Last visit we also discussed about the Freestyle libre CGM and I advised her to try to obtain it. She brings a form for me to fill out so she can obtain it. - sugars are still extremely fluctuating, maybe a little better. As sugars are high in am >> will redistribute the Lnatus >> higher dose at night and lower in am - I suggested to:  Patient Instructions  Please change: - Levemir 17 units in am and 28 at bedtime  Please continue: - Humalog mealtime dose  - small meal: 20 units  - regular meal: 26 units   Please do not skip the insulin if sugars are 70-100.   If the sugars are 60-70, take 1/2 of the Humalog dose.  If the sugars are <60, skip insulin with that meal  No Humalog at bedtime, unless sugars >300: take 5 units or >400: 8 units   Please return in 4 months with your sugar log.   - today, HbA1c is 8.3% (slightly better) - continue checking sugars at different times of the day - check 3x a day, rotating checks - advised for yearly eye exams >> she is UTD - Return to clinic in 4 mo with sugar log   2. PN  - stable, no pain, only numbness - She was previously on Lyrica, she was then recommended Neurontin, but did not start   Philemon Kingdom, MD PhD [facility] Endocrinology

## 2018-02-20 NOTE — Patient Instructions (Addendum)
Please change: - Levemir 17 units in am and 28 at bedtime  Please continue: - Humalog mealtime dose  - small meal: 20 units  - regular meal: 26 units   Please do not skip the insulin if sugars are 70-100.   If the sugars are 60-70, take 1/2 of the Humalog dose.  If the sugars are <60, skip insulin with that meal  No Humalog at bedtime, unless sugars >300: take 5 units or >400: 8 units   Please return in 4 months with your sugar log.

## 2018-03-01 ENCOUNTER — Encounter: Payer: Self-pay | Admitting: Internal Medicine

## 2018-03-01 ENCOUNTER — Ambulatory Visit: Payer: Medicare Other | Admitting: Internal Medicine

## 2018-03-01 VITALS — BP 124/84 | HR 120 | Temp 97.8°F | Resp 16 | Wt 189.0 lb

## 2018-03-01 DIAGNOSIS — B029 Zoster without complications: Secondary | ICD-10-CM

## 2018-03-01 DIAGNOSIS — M25521 Pain in right elbow: Secondary | ICD-10-CM | POA: Insufficient documentation

## 2018-03-01 MED ORDER — BETAMETHASONE DIPROPIONATE 0.05 % EX CREA: TOPICAL_CREAM | Freq: Two times a day (BID) | CUTANEOUS | 0 refills | Status: DC

## 2018-03-01 MED ORDER — AMLODIPINE BESYLATE 10 MG PO TABS: 10.0000 mg | ORAL_TABLET | Freq: Every day | ORAL | 1 refills | Status: DC

## 2018-03-01 MED ORDER — VALACYCLOVIR HCL 1 G PO TABS: 1000.0000 mg | ORAL_TABLET | Freq: Three times a day (TID) | ORAL | 0 refills | Status: DC

## 2018-03-01 NOTE — Assessment & Plan Note (Signed)
Tender, no swelling Advised NSAIDs, ice If no improvement recommended sports medicine/orthopedics

## 2018-03-01 NOTE — Patient Instructions (Addendum)
Start the valtrex for the shingles.  Get the second vaccine but get it 6 months after your first vaccine.     Shingles Shingles, which is also known as herpes zoster, is an infection that causes a painful skin rash and fluid-filled blisters. Shingles is not related to genital herpes, which is a sexually transmitted infection. Shingles only develops in people who:  Have had chickenpox.  Have received the chickenpox vaccine. (This is rare.)  What are the causes? Shingles is caused by varicella-zoster virus (VZV). This is the same virus that causes chickenpox. After exposure to VZV, the virus stays in the body in an inactive (dormant) state. Shingles develops if the virus reactivates. This can happen many years after the initial exposure to VZV. It is not known what causes this virus to reactivate. What increases the risk? People who have had chickenpox or received the chickenpox vaccine are at risk for shingles. Infection is more common in people who:  Are older than age 76.  Have a weakened defense (immune) system, such as those with HIV, AIDS, or cancer.  Are taking medicines that weaken the immune system, such as transplant medicines.  Are under great stress.  What are the signs or symptoms? Early symptoms of this condition include itching, tingling, and pain in an area on your skin. Pain may be described as burning, stabbing, or throbbing. A few days or weeks after symptoms start, a painful red rash appears, usually on one side of the body in a bandlike or beltlike pattern. The rash eventually turns into fluid-filled blisters that break open, scab over, and dry up in about 2-3 weeks. At any time during the infection, you may also develop:  A fever.  Chills.  A headache.  An upset stomach.  How is this diagnosed? This condition is diagnosed with a skin exam. Sometimes, skin or fluid samples are taken from the blisters before a diagnosis is made. These samples are examined  under a microscope or sent to a lab for testing. How is this treated? There is no specific cure for this condition. Your health care provider will probably prescribe medicines to help you manage pain, recover more quickly, and avoid long-term problems. Medicines may include:  Antiviral drugs.  Anti-inflammatory drugs.  Pain medicines.  If the area involved is on your face, you may be referred to a specialist, such as an eye doctor (ophthalmologist) or an ear, nose, and throat (ENT) doctor to help you avoid eye problems, chronic pain, or disability. Follow these instructions at home: Medicines  Take medicines only as directed by your health care provider.  Apply an anti-itch or numbing cream to the affected area as directed by your health care provider. Blister and Rash Care  Take a cool bath or apply cool compresses to the area of the rash or blisters as directed by your health care provider. This may help with pain and itching.  Keep your rash covered with a loose bandage (dressing). Wear loose-fitting clothing to help ease the pain of material rubbing against the rash.  Keep your rash and blisters clean with mild soap and cool water or as directed by your health care provider.  Check your rash every day for signs of infection. These include redness, swelling, and pain that lasts or increases.  Do not pick your blisters.  Do not scratch your rash. General instructions  Rest as directed by your health care provider.  Keep all follow-up visits as directed by your health care provider.  This is important.  Until your blisters scab over, your infection can cause chickenpox in people who have never had it or been vaccinated against it. To prevent this from happening, avoid contact with other people, especially: ? Babies. ? Pregnant women. ? Children who have eczema. ? Elderly people who have transplants. ? People who have chronic illnesses, such as leukemia or AIDS. Contact a  health care provider if:  Your pain is not relieved with prescribed medicines.  Your pain does not get better after the rash heals.  Your rash looks infected. Signs of infection include redness, swelling, and pain that lasts or increases. Get help right away if:  The rash is on your face or nose.  You have facial pain, pain around your eye area, or loss of feeling on one side of your face.  You have ear pain or you have ringing in your ear.  You have loss of taste.  Your condition gets worse. This information is not intended to replace advice given to you by your health care provider. Make sure you discuss any questions you have with your health care provider. Document Released: 10/17/2005 Document Revised: 06/12/2016 Document Reviewed: 08/28/2014 Elsevier Interactive Patient Education  2018 [employer].

## 2018-03-01 NOTE — Progress Notes (Signed)
Subjective:    Patient ID: Megan [name], female    DOB: 1941-07-08, 77 y.o.   MRN: [id number]  HPI The patient is here for an acute visit.  Had shingrix 2 weeks ago.  She noted a rash 3 days ago.  The rash was on her right arm in her antecubital region.  There are a couple of blisters that are very painful.  She has a couple of other red areas on the arm.  She has also noticed soreness behind her right shoulder, under her right breast and right armpit and in the right arm.  She has had some intermittent right lower quadrant pain.  She denies any numbness or tingling, but does have chronic neuropathy.  The area in her right antecubital region feels like it is on fire and there is a burning sensation.  She denies any fevers.  She has chronic fatigue and is unsure if her fatigue is worse.  She did put some steroid cream on the rash and that seemed to help.   Hot flashes:  Off of HRT at 71.  Gets hot.  Thinks it may be inlsuin or related to diabetes.    She has soreness at the right medial elbow.  She thought it might be a pinched nerve.  This has been going on for a few weeks.  The pain is not worse with activity.  Started about 3 weeks ago.  Medications and allergies reviewed with patient and updated if appropriate.  Patient Active Problem List   Diagnosis Date Noted  . Carpal tunnel syndrome on right 11/30/2017  . Cough 08/22/2017  . Primary osteoarthritis of both hands 03/08/2017  . High risk medication use 03/07/2017  . Poor balance 02/07/2017  . Subarachnoid bleed ([place]) 01/11/2017  . Psoriatic arthropathy ([place]) 09/08/2016  . Uncontrolled hypertension 09/08/2016  . Diabetic neuropathy ([place]) 09/08/2016  . LADA (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), managed as type 1 ([place]) 05/04/2016  . Syncope 03/25/2016  . Malnutrition of moderate degree 02/29/2016  . Diabetic ketoacidosis with coma associated with diabetes mellitus due to underlying condition ([place])   . Abnormal finding  on GI tract imaging   . Diarrhea   . Hematochezia   . RLS (restless legs syndrome) 12/11/2015  . Liver dysfunction 08/11/2015  . Lumbago 05/06/2015  . Peripheral edema 04/14/2015  . Psoriasis 04/14/2015  . Encounter for antineoplastic immunotherapy 04/06/2015  . Emphysema of lung ([place]) 11/26/2014  . Radiation-induced esophageal stricture 10/01/2014  . Tachycardia 08/27/2014  . Palliative care encounter 08/21/2014  . DNR (do not resuscitate) discussion 08/21/2014  . Cancer associated pain 07/29/2014  . Malignant cachexia ([place]) 07/29/2014  . Lung collapse 07/29/2014  . Fatigue 07/18/2014  . Neuropathy due to chemotherapeutic drug ([place]) 07/18/2014  . Leg cramps 07/18/2014  . Hypoalbuminemia 07/18/2014  . Thrombocytopenia, unspecified ([place]) 07/18/2014  . Dyspnea 04/28/2014  . Radiation esophagitis 04/10/2014  . Right-sided chest wall pain 04/01/2014  . Cancer of middle lobe of lung ([place]) 01/17/2014  . Malignant neoplasm of right upper lobe of lung ([place]) 11/28/2013    Current Outpatient Medications on File Prior to Visit  Medication Sig Dispense Refill  . amLODipine (NORVASC) 10 MG tablet Take 10 mg by mouth daily.    [name] augmented betamethasone dipropionate (DIPROLENE-AF) 0.05 % ointment APP AA BID PRN  1  . B-D UF III MINI PEN NEEDLES 31G X 5 MM MISC USE TO CHECK SUGAR FIVE TIMES DAILY 100 each 0  . budesonide-formoterol (SYMBICORT)  80-4.5 MCG/ACT inhaler Inhale 2 puffs into the lungs 2 (two) times daily. 1 Inhaler 6  . DULoxetine (CYMBALTA) 60 MG capsule Take 1 capsule (60 mg total) by mouth 2 (two) times daily. 60 capsule 12  . glucose blood test strip Use as instructed 4x a day - Verio 200 each 5  . Insulin Detemir (LEVEMIR FLEXTOUCH) 100 UNIT/ML Pen Inject 17 units in the morning, and 28 units in the pm. 15 pen 3  . insulin lispro (HUMALOG KWIKPEN) 100 UNIT/ML KiwkPen ADMINISTER 18 TO 28 UNITS UNDER THE SKIN THREE TIMES DAILY 15 pen 3  . Insulin Pen Needle (B-D UF III MINI PEN  NEEDLES) 31G X 5 MM MISC USE TO take insulin FIVE TIMES DAILY 200 each 5  . ONETOUCH DELICA LANCETS FINE MISC USE TO CHECK BLOOD SUGAR FOUR TIMES DAILY 300 each 5  . ONETOUCH VERIO test strip USE FIVE TIMES DAILY AS DIRECTED 400 each 1  . sulfaSALAzine (AZULFIDINE) 500 MG EC tablet TAKE 2 TABLETS(1000 MG) BY MOUTH TWICE DAILY 120 tablet 0   No current facility-administered medications on file prior to visit.     Past Medical History:  Diagnosis Date  . Cancer ([place]) dx'd 09/2013   Lung ca  . Concussion 2018  . Diabetes ([place]) 09/08/2016   type I diabetic patient reported on 09/13/16   . Diabetes mellitus    insulin  . Diabetic neuropathy ([place]) 09/08/2016  . Fall 2018  . GERD (gastroesophageal reflux disease)    no meds for  . History of colitis   . History of hiatal hernia   . Hx of radiation therapy 12/16/13-01/30/14   lung 66Gy  . Hypertension   . Malignant neoplasm of right upper lobe of lung ([place]) 11/28/2013  . Neuropathy   . Psoriasis   . Psoriatic arthritis ([place]) 09/08/2016  . Rheumatoid arthritis ([place])   . Shortness of breath dyspnea    with exertion    Past Surgical History:  Procedure Laterality Date  . ABDOMINAL HYSTERECTOMY    . ABDOMINAL SURGERY    . BALLOON DILATION N/A 12/12/2014   Procedure: BALLOON DILATION;  Surgeon: Inda Castle, MD;  Location: [facility];  Service: Endoscopy;  Laterality: N/A;  . CHOLECYSTECTOMY    . COLONOSCOPY N/A 02/07/2016   Procedure: COLONOSCOPY;  Surgeon: Doran Stabler, MD;  Location[facility] ENDOSCOPY;  Service: Endoscopy;  Laterality: N/A;  . DILATION AND CURETTAGE OF UTERUS    . ESOPHAGOGASTRODUODENOSCOPY N/A 12/12/2014   Procedure: ESOPHAGOGASTRODUODENOSCOPY (EGD);  Surgeon: Inda Castle, MD;  Location: [facility];  Service: Endoscopy;  Laterality: N/A;  with dilation  . ESOPHAGOGASTRODUODENOSCOPY (EGD) WITH PROPOFOL N/A 10/09/2014   Procedure: ESOPHAGOGASTRODUODENOSCOPY (EGD) WITH PROPOFOL;  Surgeon: Inda Castle, MD;   Location: [facility];  Service: Endoscopy;  Laterality: N/A;  . ESOPHAGOGASTRODUODENOSCOPY (EGD) WITH PROPOFOL N/A 09/19/2016   Procedure: ESOPHAGOGASTRODUODENOSCOPY (EGD) WITH PROPOFOL;  Surgeon: Doran Stabler, MD;  Location: [facility] ENDOSCOPY;  Service: Gastroenterology;  Laterality: N/A;  with savary dil tt  . FRACTURE SURGERY    . ORIF ANKLE FRACTURE Right 07/04/2015   Procedure: OPEN REDUCTION INTERNAL FIXATION (ORIF) ANKLE FRACTURE;  Surgeon: Newt Minion, MD;  Location: [facility];  Service: Orthopedics;  Laterality: Right;  OPEN REDUCTION, INTERNAL FIXATION OF RIGHT ANKLE FRACTURE.   [name] ORIF ANKLE FRACTURE Left 02/10/2016   Procedure: OPEN REDUCTION INTERNAL FIXATION (ORIF) ANKLE FRACTURE;  Surgeon: Newt Minion, MD;  Location: [facility];  Service: Orthopedics;  Laterality:  Left;  . SAVORY DILATION N/A 10/09/2014   Procedure: SAVORY DILATION;  Surgeon: Inda Castle, MD;  Location: [facility];  Service: Endoscopy;  Laterality: N/A;  . SAVORY DILATION N/A 09/19/2016   Procedure: SAVORY DILATION;  Surgeon: Doran Stabler, MD;  Location: [facility] ENDOSCOPY;  Service: Gastroenterology;  Laterality: N/A;  . TONSILLECTOMY      Social History   Socioeconomic History  . Marital status: Divorced    Spouse name: Not on file  . Number of children: 0  . Years of education: MA  . Highest education level: Master's degree (e.g., MA, MS, MEng, MEd, MSW, MBA)  Occupational History  . Occupation: Retired    Fish farm manager: OTHER  Social Needs  . Financial resource strain: Not on file  . Food insecurity:    Worry: Not on file    Inability: Not on file  . Transportation needs:    Medical: Not on file    Non-medical: Not on file  Tobacco Use  . Smoking status: Former Smoker    Packs/day: 3.00    Years: 45.00    Pack years: 135.00    Types: Cigarettes    Last attempt to quit: 12/05/1997    Years since quitting: 20.2  . Smokeless tobacco: Never Used  Substance and Sexual Activity  . Alcohol use: No     Alcohol/week: 0.0 oz    Comment: rarely   . Drug use: No  . Sexual activity: Not Currently  Lifestyle  . Physical activity:    Days per week: Not on file    Minutes per session: Not on file  . Stress: Not on file  Relationships  . Social connections:    Talks on phone: Not on file    Gets together: Not on file    Attends religious service: Not on file    Active member of club or organization: Not on file    Attends meetings of clubs or organizations: Not on file    Relationship status: Not on file  Other Topics Concern  . Not on file  Social History Narrative   Patient lives at home alone.   Caffeine Use: 1 cups daily   Right handed    Family History  Problem Relation Age of Onset  . Other Mother   . Other Father        failure to thrive  . Hypertension Other   . Stroke Other   . Heart attack Other   . Hemachromatosis Other   . Rheum arthritis Unknown        both sets of grandparents and father    Review of Systems  Constitutional: Positive for fatigue (Chronic). Negative for chills and fever.  Skin: Positive for rash. Negative for wound.  Neurological: Positive for numbness (Related to chronic neuropathy).       Objective:   Vitals:   03/01/18 1125  BP: 124/84  Pulse: (!) 120  Resp: 16  Temp: 97.8 F (36.6 C)  SpO2: 97%   BP Readings from Last 3 Encounters:  03/01/18 124/84  02/20/18 108/72  01/18/18 (!) 152/100   Wt Readings from Last 3 Encounters:  03/01/18 189 lb (85.7 kg)  02/20/18 192 lb 12.8 oz (87.5 kg)  01/18/18 193 lb (87.5 kg)   Body mass index is 30.51 kg/m.   Physical Exam  Constitutional: She appears well-developed and well-nourished. No distress.  HENT:  Head: Normocephalic and atraumatic.  Musculoskeletal: She exhibits tenderness (Right medial epicondyle). She exhibits no edema.  Skin: Skin is warm and dry. Rash (Cluster of blisters right antecubital region with mild surrounding erythema, erythematous macule right distal  anterior forearm and medial right upper arm, no open wounds or drainage.  Rash and no other locations) noted. She is not diaphoretic.           Assessment & Plan:    See Problem List for Assessment and Plan of chronic medical problems.

## 2018-03-01 NOTE — Assessment & Plan Note (Signed)
Symptoms and rash consistent with a shingles infection Received the first shingles vaccine 2 weeks ago-?  May have possibly been the cause.  Did advise getting the second vaccine, but waiting the full 6 months after the first vaccine Valtrex 1 g 3 times daily x7 days Betamethasone cream twice daily as needed No significant neuropathy pain, but difficult to tell because she does have chronic neuropathy Currently taking Cymbalta She will call with any questions or concerns

## 2018-03-19 ENCOUNTER — Ambulatory Visit (INDEPENDENT_AMBULATORY_CARE_PROVIDER_SITE_OTHER): Payer: Medicare Other | Admitting: *Deleted

## 2018-03-19 VITALS — BP 112/62 | HR 104 | Resp 20 | Ht 66.0 in | Wt 190.0 lb

## 2018-03-19 DIAGNOSIS — Z0000 Encounter for general adult medical examination without abnormal findings: Secondary | ICD-10-CM

## 2018-03-19 DIAGNOSIS — E0811 Diabetes mellitus due to underlying condition with ketoacidosis with coma: Secondary | ICD-10-CM | POA: Diagnosis not present

## 2018-03-19 DIAGNOSIS — Z23 Encounter for immunization: Secondary | ICD-10-CM | POA: Diagnosis not present

## 2018-03-19 MED ORDER — AMLODIPINE BESYLATE 10 MG PO TABS: 10.0000 mg | ORAL_TABLET | Freq: Every day | ORAL | 1 refills | Status: DC

## 2018-03-19 MED ORDER — BETAMETHASONE DIPROPIONATE AUG 0.05 % EX OINT: TOPICAL_OINTMENT | CUTANEOUS | 1 refills | Status: DC

## 2018-03-19 MED ORDER — VALACYCLOVIR HCL 1 G PO TABS: 1000.0000 mg | ORAL_TABLET | Freq: Three times a day (TID) | ORAL | 0 refills | Status: DC

## 2018-03-19 NOTE — Progress Notes (Addendum)
Subjective:   Megan [name] is a 77 y.o. female who presents for Medicare Annual (Subsequent) preventive examination.  Review of Systems:  No ROS.  Medicare Wellness Visit. Additional risk factors are reflected in the social history.  Cardiac Risk Factors include: advanced age (>18men, >1 women);diabetes mellitus;hypertension Sleep patterns: has interrupted sleep, gets up 2 times nightly to void and sleeps 6-8 hours nightly.    Home Safety/Smoke Alarms: Feels safe in home. Smoke alarms in place.  Living environment; residence and Firearm Safety: [place], can live on one level, no firearms. Lives with roomate, no needs for DME, good support system Seat Belt Safety/Bike Helmet: Wears seat belt.     Objective:     Vitals: BP 112/62   Pulse (!) 104   Resp 20   Ht 5\' 6"  (1.676 m)   Wt 190 lb (86.2 kg)   SpO2 97%   BMI 30.67 kg/m   Body mass index is 30.67 kg/m.  Advanced Directives 03/19/2018 01/16/2018 05/10/2017 03/22/2017 03/01/2017 01/11/2017 10/01/2016  Does Patient Have a Medical Advance Directive? Yes Yes Yes Yes Yes Yes No  Type of Paramedic of [place];Living will Healthcare Power of [place];Living will [place];Living will [place];Living will Living will -  Does patient want to make changes to medical advance directive? - - - - - No - Patient declined -  Copy of [place] in Chart? No - copy requested No - copy requested No - copy requested - No - copy requested - -  Would patient like information on creating a medical advance directive? - - - - - - -  Pre-existing out of facility DNR order (yellow form or pink MOST form) - - - - - - -    Tobacco Social History   Tobacco Use  Smoking Status Former Smoker  . Packs/day: 3.00  . Years: 45.00  . Pack years: 135.00  . Types: Cigarettes  . Last attempt to quit: 12/05/1997  . Years since quitting:  20.2  Smokeless Tobacco Never Used     Counseling given: Not Answered   Past Medical History:  Diagnosis Date  . Cancer ([place]) dx'd 09/2013   Lung ca  . Concussion 2018  . Diabetes ([place]) 09/08/2016   type I diabetic patient reported on 09/13/16   . Diabetes mellitus    insulin  . Diabetic neuropathy ([place]) 09/08/2016  . Fall 2018  . GERD (gastroesophageal reflux disease)    no meds for  . History of colitis   . History of hiatal hernia   . Hx of radiation therapy 12/16/13-01/30/14   lung 66Gy  . Hypertension   . Malignant neoplasm of right upper lobe of lung ([place]) 11/28/2013  . Neuropathy   . Psoriasis   . Psoriatic arthritis ([place]) 09/08/2016  . Rheumatoid arthritis ([place])   . Shortness of breath dyspnea    with exertion   Past Surgical History:  Procedure Laterality Date  . ABDOMINAL HYSTERECTOMY    . ABDOMINAL SURGERY    . BALLOON DILATION N/A 12/12/2014   Procedure: BALLOON DILATION;  Surgeon: Inda Castle, MD;  Location: [facility];  Service: Endoscopy;  Laterality: N/A;  . CHOLECYSTECTOMY    . COLONOSCOPY N/A 02/07/2016   Procedure: COLONOSCOPY;  Surgeon: Doran Stabler, MD;  Location: [facility] ENDOSCOPY;  Service: Endoscopy;  Laterality: N/A;  . DILATION AND CURETTAGE OF UTERUS    . ESOPHAGOGASTRODUODENOSCOPY N/A  12/12/2014   Procedure: ESOPHAGOGASTRODUODENOSCOPY (EGD);  Surgeon: Inda Castle, MD;  Location: [facility];  Service: Endoscopy;  Laterality: N/A;  with dilation  . ESOPHAGOGASTRODUODENOSCOPY (EGD) WITH PROPOFOL N/A 10/09/2014   Procedure: ESOPHAGOGASTRODUODENOSCOPY (EGD) WITH PROPOFOL;  Surgeon: Inda Castle, MD;  Location: [facility];  Service: Endoscopy;  Laterality: N/A;  . ESOPHAGOGASTRODUODENOSCOPY (EGD) WITH PROPOFOL N/A 09/19/2016   Procedure: ESOPHAGOGASTRODUODENOSCOPY (EGD) WITH PROPOFOL;  Surgeon: Doran Stabler, MD;  Location: [facility] ENDOSCOPY;  Service: Gastroenterology;  Laterality: N/A;  with savary dil tt  . FRACTURE SURGERY    . ORIF  ANKLE FRACTURE Right 07/04/2015   Procedure: OPEN REDUCTION INTERNAL FIXATION (ORIF) ANKLE FRACTURE;  Surgeon: Newt Minion, MD;  Location: [facility];  Service: Orthopedics;  Laterality: Right;  OPEN REDUCTION, INTERNAL FIXATION OF RIGHT ANKLE FRACTURE.   [name] ORIF ANKLE FRACTURE Left 02/10/2016   Procedure: OPEN REDUCTION INTERNAL FIXATION (ORIF) ANKLE FRACTURE;  Surgeon: Newt Minion, MD;  Location: [facility];  Service: Orthopedics;  Laterality: Left;  . SAVORY DILATION N/A 10/09/2014   Procedure: SAVORY DILATION;  Surgeon: Inda Castle, MD;  Location: [facility];  Service: Endoscopy;  Laterality: N/A;  . SAVORY DILATION N/A 09/19/2016   Procedure: SAVORY DILATION;  Surgeon: Doran Stabler, MD;  Location: [facility] ENDOS[facility];  Service: Gastroenterology;  Laterality: N/A;  . TONSILLECTOMY     Family History  Problem Relation Age of Onset  . Other Mother   . Other Father        failure to thrive  . Hypertension Other   . Stroke Other   . Heart attack Other   . Hemachromatosis Other   . Rheum arthritis Unknown        both sets of grandparents and father   Social History   Socioeconomic History  . Marital status: Divorced    Spouse name: Not on file  . Number of children: 0  . Years of education: MA  . Highest education level: Master's degree (e.g., MA, MS, MEng, MEd, MSW, MBA)  Occupational History  . Occupation: Retired    Fish farm manager: OTHER  Social Needs  . Financial resource strain: Not hard at all  . Food insecurity:    Worry: Never true    Inability: Never true  . Transportation needs:    Medical: No    Non-medical: No  Tobacco Use  . Smoking status: Former Smoker    Packs/day: 3.00    Years: 45.00    Pack years: 135.00    Types: Cigarettes    Last attempt to quit: 12/05/1997    Years since quitting: 20.2  . Smokeless tobacco: Never Used  Substance and Sexual Activity  . Alcohol use: No    Alcohol/week: 0.0 oz    Comment: rarely   . Drug use: No  . Sexual activity: Not  Currently  Lifestyle  . Physical activity:    Days per week: 0 days    Minutes per session: 0 min  . Stress: Not at all  Relationships  . Social connections:    Talks on phone: More than three times a week    Gets together: More than three times a week    Attends religious service: More than 4 times per year    Active member of club or organization: Yes    Attends meetings of clubs or organizations: More than 4 times per year    Relationship status: Not on file  Other Topics Concern  . Not on  file  Social History Narrative   Patient lives at home alone.   Caffeine Use: 1 cups daily   Right handed    Outpatient Encounter Medications as of 03/19/2018  Medication Sig  . amLODipine (NORVASC) 10 MG tablet Take 1 tablet (10 mg total) by mouth daily.  [name] augmented betamethasone dipropionate (DIPROLENE-AF) 0.05 % ointment APP AA BID PRN  . B-D UF III MINI PEN NEEDLES 31G X 5 MM MISC USE TO CHECK SUGAR FIVE TIMES DAILY  . betamethasone dipropionate (DIPROLENE) 0.05 % cream Apply topically 2 (two) times daily.  . budesonide-formoterol (SYMBICORT) 80-4.5 MCG/ACT inhaler Inhale 2 puffs into the lungs 2 (two) times daily.  . DULoxetine (CYMBALTA) 60 MG capsule Take 1 capsule (60 mg total) by mouth 2 (two) times daily.  [name] glucose blood test strip Use as instructed 4x a day - Verio  . Insulin Detemir (LEVEMIR FLEXTOUCH) 100 UNIT/ML Pen Inject 17 units in the morning, and 28 units in the pm.  . insulin lispro (HUMALOG KWIKPEN) 100 UNIT/ML KiwkPen ADMINISTER 18 TO 28 UNITS UNDER THE SKIN THREE TIMES DAILY  . Insulin Pen Needle (B-D UF III MINI PEN NEEDLES) 31G X 5 MM MISC USE TO take insulin FIVE TIMES DAILY  . ONETOUCH DELICA LANCETS FINE MISC USE TO CHECK BLOOD SUGAR FOUR TIMES DAILY  . ONETOUCH VERIO test strip USE FIVE TIMES DAILY AS DIRECTED  . sulfaSALAzine (AZULFIDINE) 500 MG EC tablet TAKE 2 TABLETS(1000 MG) BY MOUTH TWICE DAILY  . valACYclovir (VALTREX) 1000 MG tablet Take 1 tablet (1,000  mg total) by mouth 3 (three) times daily.   No facility-administered encounter medications on file as of 03/19/2018.     Activities of Daily Living In your present state of health, do you have any difficulty performing the following activities: 03/19/2018  Hearing? N  Vision? N  Difficulty concentrating or making decisions? N  Walking or climbing stairs? N  Dressing or bathing? N  Doing errands, shopping? N  Preparing Food and eating ? N  Using the Toilet? N  In the past six months, have you accidently leaked urine? Y  Comment followed by Dr. Jeffie Pollock  Do you have problems with loss of bowel control? N  Managing your Medications? N  Managing your Finances? N  Housekeeping or managing your Housekeeping? N  Some recent data might be hidden    Patient Care Team: Binnie Rail, MD as PCP - General (Internal Medicine) Curt Bears, MD as Consulting Physician (Oncology)    Assessment:   This is a routine wellness examination for [place]. Physical assessment deferred to PCP.   Exercise Activities and Dietary recommendations Current Exercise Habits: The patient does not participate in regular exercise at present  Diet (meal preparation, eat out, water intake, caffeinated beverages, dairy products, fruits and vegetables): in general, a "healthy" diet  , well balanced   Reviewed heart healthy and diabetic diet, encouraged patient to increase daily water intake.  Goals    . Patient Stated     I want to increase my physical activity by starting to walk. Continue to enjoy life, travel and [place].       Fall Risk Fall Risk  03/19/2018 09/29/2017 11/11/2016 09/01/2016 12/15/2014  Falls in the past year? No No No No No  Number falls in past yr: - - - - -  Injury with Fall? - - - - No  Risk for fall due to : - - - - -  Risk for fall due  to: Comment - - - - -    Depression Screen PHQ 2/9 Scores 03/19/2018 09/29/2017 11/11/2016 09/01/2016  PHQ - 2 Score 2 0 0 0  PHQ- 9 Score 4 -  - -    Cognitive Function MMSE - Mini Mental State Exam 03/19/2018  Orientation to time 5  Orientation to Place 5  Registration 3  Attention/ Calculation 5  Recall 1  Language- name 2 objects 2  Language- repeat 1  Language- follow 3 step command 3  Language- read & follow direction 1  Write a sentence 1  Copy design 1  Total score 28        Immunization History  Administered Date(s) Administered  . Influenza, High Dose Seasonal PF 08/12/2016, 08/30/2017  . Influenza,inj,Quad PF,6+ Mos 11/07/2013, 07/14/2014, 08/25/2015  . PPD Test 03/02/2016  . Pneumococcal-Unspecified 12/02/2015  . Tdap 01/11/2017   Screening Tests Health Maintenance  Topic Date Due  . FOOT EXAM  12/06/1950  . URINE MICROALBUMIN  12/06/1950  . DEXA SCAN  12/05/2006  . PNA vac Low Risk Adult (2 of 2 - PCV13) 12/01/2016  . INFLUENZA VACCINE  05/31/2018  . HEMOGLOBIN A1C  08/22/2018  . OPHTHALMOLOGY EXAM  12/07/2018  . TETANUS/TDAP  01/12/2027       Plan:     Prescription refills ordered for for amlodipine, diprolene, and valtrex per verbal order PCP.  Continue doing brain stimulating activities (puzzles, reading, adult coloring books, staying active) to keep memory sharp.   Continue to eat heart healthy diet (full of fruits, vegetables, whole grains, lean protein, water--limit salt, fat, and sugar intake) and increase physical activity as tolerated.  I have personally reviewed and noted the following in the patient's chart:   . Medical and social history . Use of alcohol, tobacco or illicit drugs  . Current medications and supplements . Functional ability and status . Nutritional status . Physical activity . Advanced directives . List of other physicians . Vitals . Screenings to include cognitive, depression, and falls . Referrals and appointments  In addition, I have reviewed and discussed with patient certain preventive protocols, quality metrics, and best practice recommendations. A  written personalized care plan for preventive services as well as general preventive health recommendations were provided to patient.     Michiel Cowboy, RN  03/19/2018    Medical screening examination/treatment/procedure(s) were performed by non-physician practitioner and as supervising physician I was immediately available for consultation/collaboration. I agree with above. Binnie Rail, MD

## 2018-03-19 NOTE — Patient Instructions (Addendum)
Continue doing brain stimulating activities (puzzles, reading, adult coloring books, staying active) to keep memory sharp.   Continue to eat heart healthy diet (full of fruits, vegetables, whole grains, lean protein, water--limit salt, fat, and sugar intake) and increase physical activity as tolerated.   [name] , Thank you for taking time to come for your Medicare Wellness Visit. I appreciate your ongoing commitment to your health goals. Please review the following plan we discussed and let me know if I can assist you in the future.   These are the goals we discussed: Goals    . Patient Stated     I want to increase my physical activity by starting to walk. Continue to travel, enjoy life, travel and love [place].       This is a list of the screening recommended for you and due dates:  Health Maintenance  Topic Date Due  . Complete foot exam   12/06/1950  . Urine Protein Check  12/06/1950  . DEXA scan (bone density measurement)  12/05/2006  . Pneumonia vaccines (2 of 2 - PCV13) 12/01/2016  . Flu Shot  05/31/2018  . Hemoglobin A1C  08/22/2018  . Eye exam for diabetics  12/07/2018  . Tetanus Vaccine  01/12/2027  Health Maintenance, Female Adopting a healthy lifestyle and getting preventive care can go a long way to promote health and wellness. Talk with your health care provider about what schedule of regular examinations is right for you. This is a good chance for you to check in with your provider about disease prevention and staying healthy. In between checkups, there are plenty of things you can do on your own. Experts have done a lot of research about which lifestyle changes and preventive measures are most likely to keep you healthy. Ask your health care provider for more information. Weight and diet Eat a healthy diet  Be sure to include plenty of vegetables, fruits, low-fat dairy products, and lean protein.  Do not eat a lot of foods high in solid fats, added sugars, or  salt.  Get regular exercise. This is one of the most important things you can do for your health. ? Most adults should exercise for at least 150 minutes each week. The exercise should increase your heart rate and make you sweat (moderate-intensity exercise). ? Most adults should also do strengthening exercises at least twice a week. This is in addition to the moderate-intensity exercise.  Maintain a healthy weight  Body mass index (BMI) is a measurement that can be used to identify possible weight problems. It estimates body fat based on height and weight. Your health care provider can help determine your BMI and help you achieve or maintain a healthy weight.  For females 42 years of age and older: ? A BMI below 18.5 is considered underweight. ? A BMI of 18.5 to 24.9 is normal. ? A BMI of 25 to 29.9 is considered overweight. ? A BMI of 30 and above is considered obese.  Watch levels of cholesterol and blood lipids  You should start having your blood tested for lipids and cholesterol at 76 years of age, then have this test every 5 years.  You may need to have your cholesterol levels checked more often if: ? Your lipid or cholesterol levels are high. ? You are older than 76 years of age. ? You are at high risk for heart disease.  Cancer screening Lung Cancer  Lung cancer screening is recommended for adults 63-47 years old who  are at high risk for lung cancer because of a history of smoking.  A yearly low-dose CT scan of the lungs is recommended for people who: ? Currently smoke. ? Have quit within the past 15 years. ? Have at least a 30-pack-year history of smoking. A pack year is smoking an average of one pack of cigarettes a day for 1 year.  Yearly screening should continue until it has been 15 years since you quit.  Yearly screening should stop if you develop a health problem that would prevent you from having lung cancer treatment.  Breast Cancer  Practice breast  self-awareness. This means understanding how your breasts normally appear and feel.  It also means doing regular breast self-exams. Let your health care provider know about any changes, no matter how small.  If you are in your 20s or 30s, you should have a clinical breast exam (CBE) by a health care provider every 1-3 years as part of a regular health exam.  If you are 27 or older, have a CBE every year. Also consider having a breast X-ray (mammogram) every year.  If you have a family history of breast cancer, talk to your health care provider about genetic screening.  If you are at high risk for breast cancer, talk to your health care provider about having an MRI and a mammogram every year.  Breast cancer gene (BRCA) assessment is recommended for women who have family members with BRCA-related cancers. BRCA-related cancers include: ? Breast. ? Ovarian. ? Tubal. ? Peritoneal cancers.  Results of the assessment will determine the need for genetic counseling and BRCA1 and BRCA2 testing.  Cervical Cancer Your health care provider may recommend that you be screened regularly for cancer of the pelvic organs (ovaries, uterus, and vagina). This screening involves a pelvic examination, including checking for microscopic changes to the surface of your cervix (Pap test). You may be encouraged to have this screening done every 3 years, beginning at age 44.  For women ages 67-65, health care providers may recommend pelvic exams and Pap testing every 3 years, or they may recommend the Pap and pelvic exam, combined with testing for human papilloma virus (HPV), every 5 years. Some types of HPV increase your risk of cervical cancer. Testing for HPV may also be done on women of any age with unclear Pap test results.  Other health care providers may not recommend any screening for nonpregnant women who are considered low risk for pelvic cancer and who do not have symptoms. Ask your health care provider if a  screening pelvic exam is right for you.  If you have had past treatment for cervical cancer or a condition that could lead to cancer, you need Pap tests and screening for cancer for at least 20 years after your treatment. If Pap tests have been discontinued, your risk factors (such as having a new sexual partner) need to be reassessed to determine if screening should resume. Some women have medical problems that increase the chance of getting cervical cancer. In these cases, your health care provider may recommend more frequent screening and Pap tests.  Colorectal Cancer  This type of cancer can be detected and often prevented.  Routine colorectal cancer screening usually begins at 76 years of age and continues through 76 years of age.  Your health care provider may recommend screening at an earlier age if you have risk factors for colon cancer.  Your health care provider may also recommend using home test kits to  check for hidden blood in the stool.  A small camera at the end of a tube can be used to examine your colon directly (sigmoidoscopy or colonoscopy). This is done to check for the earliest forms of colorectal cancer.  Routine screening usually begins at age 53.  Direct examination of the colon should be repeated every 5-10 years through 76 years of age. However, you may need to be screened more often if early forms of precancerous polyps or small growths are found.  Skin Cancer  Check your skin from head to toe regularly.  Tell your health care provider about any new moles or changes in moles, especially if there is a change in a mole's shape or color.  Also tell your health care provider if you have a mole that is larger than the size of a pencil eraser.  Always use sunscreen. Apply sunscreen liberally and repeatedly throughout the day.  Protect yourself by wearing long sleeves, pants, a wide-brimmed hat, and sunglasses whenever you are outside.  Heart disease, diabetes, and  high blood pressure  High blood pressure causes heart disease and increases the risk of stroke. High blood pressure is more likely to develop in: ? People who have blood pressure in the high end of the normal range (130-139/85-89 mm Hg). ? People who are overweight or obese. ? People who are African American.  If you are 14-73 years of age, have your blood pressure checked every 3-5 years. If you are 8 years of age or older, have your blood pressure checked every year. You should have your blood pressure measured twice-once when you are at a hospital or clinic, and once when you are not at a hospital or clinic. Record the average of the two measurements. To check your blood pressure when you are not at a hospital or clinic, you can use: ? An automated blood pressure machine at a pharmacy. ? A home blood pressure monitor.  If you are between 13 years and 75 years old, ask your health care provider if you should take aspirin to prevent strokes.  Have regular diabetes screenings. This involves taking a blood sample to check your fasting blood sugar level. ? If you are at a normal weight and have a low risk for diabetes, have this test once every three years after 76 years of age. ? If you are overweight and have a high risk for diabetes, consider being tested at a younger age or more often. Preventing infection Hepatitis B  If you have a higher risk for hepatitis B, you should be screened for this virus. You are considered at high risk for hepatitis B if: ? You were born in a country where hepatitis B is common. Ask your health care provider which countries are considered high risk. ? Your parents were born in a high-risk country, and you have not been immunized against hepatitis B (hepatitis B vaccine). ? You have HIV or AIDS. ? You use needles to inject street drugs. ? You live with someone who has hepatitis B. ? You have had sex with someone who has hepatitis B. ? You get hemodialysis  treatment. ? You take certain medicines for conditions, including cancer, organ transplantation, and autoimmune conditions.  Hepatitis C  Blood testing is recommended for: ? Everyone born from [birth year] through 1964. ? Anyone with known risk factors for hepatitis C.  Sexually transmitted infections (STIs)  You should be screened for sexually transmitted infections (STIs) including gonorrhea and chlamydia if: ? You  are sexually active and are younger than 76 years of age. ? You are older than 76 years of age and your health care provider tells you that you are at risk for this type of infection. ? Your sexual activity has changed since you were last screened and you are at an increased risk for chlamydia or gonorrhea. Ask your health care provider if you are at risk.  If you do not have HIV, but are at risk, it may be recommended that you take a prescription medicine daily to prevent HIV infection. This is called pre-exposure prophylaxis (PrEP). You are considered at risk if: ? You are sexually active and do not regularly use condoms or know the HIV status of your partner(s). ? You take drugs by injection. ? You are sexually active with a partner who has HIV.  Talk with your health care provider about whether you are at high risk of being infected with HIV. If you choose to begin PrEP, you should first be tested for HIV. You should then be tested every 3 months for as long as you are taking PrEP. Pregnancy  If you are premenopausal and you may become pregnant, ask your health care provider about preconception counseling.  If you may become pregnant, take 400 to 800 micrograms (mcg) of folic acid every day.  If you want to prevent pregnancy, talk to your health care provider about birth control (contraception). Osteoporosis and menopause  Osteoporosis is a disease in which the bones lose minerals and strength with aging. This can result in serious bone fractures. Your risk for osteoporosis  can be identified using a bone density scan.  If you are 75 years of age or older, or if you are at risk for osteoporosis and fractures, ask your health care provider if you should be screened.  Ask your health care provider whether you should take a calcium or vitamin D supplement to lower your risk for osteoporosis.  Menopause may have certain physical symptoms and risks.  Hormone replacement therapy may reduce some of these symptoms and risks. Talk to your health care provider about whether hormone replacement therapy is right for you. Follow these instructions at home:  Schedule regular health, dental, and eye exams.  Stay current with your immunizations.  Do not use any tobacco products including cigarettes, chewing tobacco, or electronic cigarettes.  If you are pregnant, do not drink alcohol.  If you are breastfeeding, limit how much and how often you drink alcohol.  Limit alcohol intake to no more than 1 drink per day for nonpregnant women. One drink equals 12 ounces of beer, 5 ounces of wine, or 1 ounces of hard liquor.  Do not use street drugs.  Do not share needles.  Ask your health care provider for help if you need support or information about quitting drugs.  Tell your health care provider if you often feel depressed.  Tell your health care provider if you have ever been abused or do not feel safe at home. This information is not intended to replace advice given to you by your health care provider. Make sure you discuss any questions you have with your health care provider. Document Released: 05/02/2011 Document Revised: 03/24/2016 Document Reviewed: 07/21/2015 Elsevier Interactive Patient Education  [employer].

## 2018-03-28 ENCOUNTER — Other Ambulatory Visit: Payer: Self-pay | Admitting: Rheumatology

## 2018-03-29 ENCOUNTER — Encounter: Payer: Self-pay | Admitting: Internal Medicine

## 2018-03-29 ENCOUNTER — Telehealth: Payer: Self-pay | Admitting: Internal Medicine

## 2018-03-29 NOTE — Telephone Encounter (Signed)
Patient is calling on the status of letter that she states Dr Cruzita Lederer would be witting to [facility]. She states that when she came in on 4/23 that the letter would be sent for the freestyle libra  Please advise

## 2018-03-29 NOTE — Telephone Encounter (Signed)
I am sorry, I just wrote this today.... Can you please fax it.

## 2018-03-29 NOTE — Progress Notes (Signed)
Subjective:    Patient ID: Megan [name], female    DOB: [birth year], 77 y.o.   MRN: [id number]  HPI The patient is here for follow up.  Shingles:  She had shingles on her right arm.  The blisters are gone and she has scars.  At night she does not sleep.  She has pain in her right axilla, upper right back, and right elbow.  The pain is sharp and severe at times.    Hypertension: She is taking her medication daily. She is compliant with a low sodium diet.  She denies chest pain, palpitations, and regular headaches. She is not exercising regularly.    Neuropathy - numbness/tingling in hands and feet from chemo:  She is taking cymbalta.  She was taking lyrica and was experiencing weight gain, dizziness and SOB and we tapered her off of this over the past few months.  She thought about restarted gabapentin.  There was no change in the numbness after stopping the lyrica.       Medications and allergies reviewed with patient and updated if appropriate.  Patient Active Problem List   Diagnosis Date Noted  . Herpes zoster without complication 24/40/1027  . Right elbow pain 03/01/2018  . Carpal tunnel syndrome on right 11/30/2017  . Cough 08/22/2017  . Primary osteoarthritis of both hands 03/08/2017  . High risk medication use 03/07/2017  . Poor balance 02/07/2017  . Subarachnoid bleed ([place]) 01/11/2017  . Psoriatic arthropathy ([place]) 09/08/2016  . Uncontrolled hypertension 09/08/2016  . Diabetic neuropathy ([place]) 09/08/2016  . LADA (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), managed as type 1 ([place]) 05/04/2016  . Syncope 03/25/2016  . Malnutrition of moderate degree 02/29/2016  . Diabetic ketoacidosis with coma associated with diabetes mellitus due to underlying condition ([place])   . Abnormal finding on GI tract imaging   . Diarrhea   . Hematochezia   . RLS (restless legs syndrome) 12/11/2015  . Liver dysfunction 08/11/2015  . Lumbago 05/06/2015  . Peripheral edema 04/14/2015  .  Psoriasis 04/14/2015  . Encounter for antineoplastic immunotherapy 04/06/2015  . Emphysema of lung ([place]) 11/26/2014  . Radiation-induced esophageal stricture 10/01/2014  . Tachycardia 08/27/2014  . DNR (do not resuscitate) discussion 08/21/2014  . Cancer associated pain 07/29/2014  . Malignant cachexia ([place]) 07/29/2014  . Lung collapse 07/29/2014  . Fatigue 07/18/2014  . Neuropathy due to chemotherapeutic drug ([place]) 07/18/2014  . Leg cramps 07/18/2014  . Hypoalbuminemia 07/18/2014  . Thrombocytopenia, unspecified ([place]) 07/18/2014  . Dyspnea 04/28/2014  . Radiation esophagitis 04/10/2014  . Right-sided chest wall pain 04/01/2014  . Cancer of middle lobe of lung ([place]) 01/17/2014  . Malignant neoplasm of right upper lobe of lung ([place]) 11/28/2013    Current Outpatient Medications on File Prior to Visit  Medication Sig Dispense Refill  . amLODipine (NORVASC) 10 MG tablet Take 1 tablet (10 mg total) by mouth daily. 90 tablet 1  . augmented betamethasone dipropionate (DIPROLENE-AF) 0.05 % ointment APP AA BID PRN 30 g 1  . B-D UF III MINI PEN NEEDLES 31G X 5 MM MISC USE TO CHECK SUGAR FIVE TIMES DAILY 100 each 0  . betamethasone dipropionate (DIPROLENE) 0.05 % cream Apply topically 2 (two) times daily. 45 g 0  . budesonide-formoterol (SYMBICORT) 80-4.5 MCG/ACT inhaler Inhale 2 puffs into the lungs 2 (two) times daily. 1 Inhaler 6  . DULoxetine (CYMBALTA) 60 MG capsule Take 1 capsule (60 mg total) by mouth 2 (two) times daily. 60 capsule 12  .  glucose blood test strip Use as instructed 4x a day - Verio 200 each 5  . Insulin Detemir (LEVEMIR FLEXTOUCH) 100 UNIT/ML Pen Inject 17 units in the morning, and 28 units in the pm. 15 pen 3  . insulin lispro (HUMALOG KWIKPEN) 100 UNIT/ML KiwkPen ADMINISTER 18 TO 28 UNITS UNDER THE SKIN THREE TIMES DAILY 15 pen 3  . Insulin Pen Needle (B-D UF III MINI PEN NEEDLES) 31G X 5 MM MISC USE TO take insulin FIVE TIMES DAILY 200 each 5  . ONETOUCH DELICA LANCETS  FINE MISC USE TO CHECK BLOOD SUGAR FOUR TIMES DAILY 300 each 5  . ONETOUCH VERIO test strip USE FIVE TIMES DAILY AS DIRECTED 400 each 1  . sulfaSALAzine (AZULFIDINE) 500 MG EC tablet TAKE 2 TABLETS(1000 MG) BY MOUTH TWICE DAILY 120 tablet 0  . valACYclovir (VALTREX) 1000 MG tablet Take 1 tablet (1,000 mg total) by mouth 3 (three) times daily. 21 tablet 0   No current facility-administered medications on file prior to visit.     Past Medical History:  Diagnosis Date  . Cancer ([place]) dx'd 09/2013   Lung ca  . Concussion 2018  . Diabetes ([place]) 09/08/2016   type I diabetic patient reported on 09/13/16   . Diabetes mellitus    insulin  . Diabetic neuropathy ([place]) 09/08/2016  . Fall 2018  . GERD (gastroesophageal reflux disease)    no meds for  . History of colitis   . History of hiatal hernia   . Hx of radiation therapy 12/16/13-01/30/14   lung 66Gy  . Hypertension   . Malignant neoplasm of right upper lobe of lung ([place]) 11/28/2013  . Neuropathy   . Psoriasis   . Psoriatic arthritis ([place]) 09/08/2016  . Rheumatoid arthritis ([place])   . Shortness of breath dyspnea    with exertion    Past Surgical History:  Procedure Laterality Date  . ABDOMINAL HYSTERECTOMY    . ABDOMINAL SURGERY    . BALLOON DILATION N/A 12/12/2014   Procedure: BALLOON DILATION;  Surgeon: Inda Castle, MD;  Location: [facility];  Service: Endoscopy;  Laterality: N/A;  . CHOLECYSTECTOMY    . COLONOSCOPY N/A 02/07/2016   Procedure: COLONOSCOPY;  Surgeon: Doran Stabler, MD;  Location: [facility] ENDOSCOPY;  Service: Endoscopy;  Laterality: N/A;  . DILATION AND CURETTAGE OF UTERUS    . ESOPHAGOGASTRODUODENOSCOPY N/A 12/12/2014   Procedure: ESOPHAGOGASTRODUODENOSCOPY (EGD);  Surgeon: Inda Castle, MD;  Location: [facility];  Service: Endoscopy;  Laterality: N/A;  with dilation  . ESOPHAGOGASTRODUODENOSCOPY (EGD) WITH PROPOFOL N/A 10/09/2014   Procedure: ESOPHAGOGASTRODUODENOSCOPY (EGD) WITH PROPOFOL;  Surgeon: Inda Castle, MD;  Location: [facility];  Service: Endoscopy;  Laterality: N/A;  . ESOPHAGOGASTRODUODENOSCOPY (EGD) WITH PROPOFOL N/A 09/19/2016   Procedure: ESOPHAGOGASTRODUODENOSCOPY (EGD) WITH PROPOFOL;  Surgeon: Doran Stabler, MD;  Location: [facility] ENDOSCOPY;  Service: Gastroenterology;  Laterality: N/A;  with savary dil tt  . FRACTURE SURGERY    . ORIF ANKLE FRACTURE Right 07/04/2015   Procedure: OPEN REDUCTION INTERNAL FIXATION (ORIF) ANKLE FRACTURE;  Surgeon: Newt Minion, MD;  Location: [facility];  Service: Orthopedics;  Laterality: Right;  OPEN REDUCTION, INTERNAL FIXATION OF RIGHT ANKLE FRACTURE.   [name] ORIF ANKLE FRACTURE Left 02/10/2016   Procedure: OPEN REDUCTION INTERNAL FIXATION (ORIF) ANKLE FRACTURE;  Surgeon: Newt Minion, MD;  Location: [facility];  Service: Orthopedics;  Laterality: Left;  . SAVORY DILATION N/A 10/09/2014   Procedure: SAVORY DILATION;  Surgeon: Inda Castle, MD;  Location: [facility];  Service: Endoscopy;  Laterality: N/A;  . SAVORY DILATION N/A 09/19/2016   Procedure: SAVORY DILATION;  Surgeon: Doran Stabler, MD;  Location: [facility] ENDOSCOPY;  Service: Gastroenterology;  Laterality: N/A;  . TONSILLECTOMY      Social History   Socioeconomic History  . Marital status: Divorced    Spouse name: Not on file  . Number of children: 0  . Years of education: MA  . Highest education level: Master's degree (e.g., MA, MS, MEng, MEd, MSW, MBA)  Occupational History  . Occupation: Retired    Fish farm manager: OTHER  Social Needs  . Financial resource strain: Not hard at all  . Food insecurity:    Worry: Never true    Inability: Never true  . Transportation needs:    Medical: No    Non-medical: No  Tobacco Use  . Smoking status: Former Smoker    Packs/day: 3.00    Years: 45.00    Pack years: 135.00    Types: Cigarettes    Last attempt to quit: 12/05/1997    Years since quitting: 20.3  . Smokeless tobacco: Never Used  Substance and Sexual Activity  . Alcohol use: No     Alcohol/week: 0.0 oz    Comment: rarely   . Drug use: No  . Sexual activity: Not Currently  Lifestyle  . Physical activity:    Days per week: 0 days    Minutes per session: 0 min  . Stress: Not at all  Relationships  . Social connections:    Talks on phone: More than three times a week    Gets together: More than three times a week    Attends religious service: More than 4 times per year    Active member of club or organization: Yes    Attends meetings of clubs or organizations: More than 4 times per year    Relationship status: Not on file  Other Topics Concern  . Not on file  Social History Narrative   Patient lives at home alone.   Caffeine Use: 1 cups daily   Right handed    Family History  Problem Relation Age of Onset  . Other Mother   . Other Father        failure to thrive  . Hypertension Other   . Stroke Other   . Heart attack Other   . Hemachromatosis Other   . Rheum arthritis Unknown        both sets of grandparents and father    Review of Systems  Constitutional: Negative for chills and fever.  Respiratory: Positive for shortness of breath.   Cardiovascular: Positive for leg swelling. Negative for chest pain and palpitations.  Neurological: Positive for light-headedness (rare) and numbness (feet, left from chemo induced neuropathy). Negative for headaches.       Objective:   Vitals:   03/30/18 1515  BP: 116/80  Pulse: (!) 116  Resp: 20  Temp: 98.7 F (37.1 C)  SpO2: 95%   BP Readings from Last 3 Encounters:  03/30/18 116/80  03/19/18 112/62  03/01/18 124/84   Wt Readings from Last 3 Encounters:  03/30/18 189 lb (85.7 kg)  03/19/18 190 lb (86.2 kg)  03/01/18 189 lb (85.7 kg)   Body mass index is 30.51 kg/m.   Physical Exam    Constitutional: Appears well-developed and well-nourished. No distress.  HENT:  Head: Normocephalic and atraumatic.  Neck: Neck supple. No tracheal deviation present. No thyromegaly present.  No cervical  lymphadenopathy Cardiovascular: Normal rate, regular rhythm and normal heart sounds.   No murmur heard. No carotid bruit .  2 + b/l LE edema Pulmonary/Chest: Effort normal and breath sounds normal. No respiratory distress. No has no wheezes. No rales.  Skin: Skin is warm and dry. Not diaphoretic. No active shingles lesions - scars only Psychiatric: Normal mood and affect. Behavior is normal.      Assessment & Plan:    See Problem List for Assessment and Plan of chronic medical problems.

## 2018-03-29 NOTE — Telephone Encounter (Signed)
Please advise on below  

## 2018-03-29 NOTE — Patient Instructions (Addendum)
  Medications reviewed and updated.  Changes include starting gabapentin 300 mg three times a day and vicodin for bedtime only.   Your prescription(s) have been submitted to your pharmacy. Please take as directed and contact our office if you believe you are having problem(s) with the medication(s).   Please followup if no improvement or if pain persists

## 2018-03-30 ENCOUNTER — Encounter: Payer: Self-pay | Admitting: Internal Medicine

## 2018-03-30 ENCOUNTER — Ambulatory Visit: Payer: Medicare Other | Admitting: Internal Medicine

## 2018-03-30 VITALS — BP 116/80 | HR 116 | Temp 98.7°F | Resp 20 | Wt 189.0 lb

## 2018-03-30 DIAGNOSIS — T451X5A Adverse effect of antineoplastic and immunosuppressive drugs, initial encounter: Secondary | ICD-10-CM

## 2018-03-30 DIAGNOSIS — B0229 Other postherpetic nervous system involvement: Secondary | ICD-10-CM | POA: Insufficient documentation

## 2018-03-30 DIAGNOSIS — G620 Drug-induced polyneuropathy: Secondary | ICD-10-CM | POA: Diagnosis not present

## 2018-03-30 DIAGNOSIS — I10 Essential (primary) hypertension: Secondary | ICD-10-CM

## 2018-03-30 MED ORDER — GABAPENTIN 300 MG PO CAPS: 300.0000 mg | ORAL_CAPSULE | Freq: Three times a day (TID) | ORAL | 1 refills | Status: DC

## 2018-03-30 MED ORDER — HYDROCODONE-ACETAMINOPHEN 5-325 MG PO TABS: 1.0000 | ORAL_TABLET | Freq: Every evening | ORAL | 0 refills | Status: DC | PRN

## 2018-03-30 NOTE — Assessment & Plan Note (Signed)
Having significant post herpetic nerve pain Start gabapentin 300 mg TID vicodin for nighttime only x 5 nights

## 2018-03-30 NOTE — Telephone Encounter (Signed)
Letter faxed to [facility] who handles [facility] pharmacy benefits and pt is aware

## 2018-03-31 NOTE — Assessment & Plan Note (Signed)
BP well controlled Current regimen effective and well tolerated Continue current medications at current doses  

## 2018-03-31 NOTE — Assessment & Plan Note (Signed)
Stopped lyrica No change in symptoms Continue cymbalta Will start gabapentin for post herpetic neuropathy

## 2018-04-05 ENCOUNTER — Other Ambulatory Visit: Payer: Self-pay | Admitting: Rheumatology

## 2018-04-09 NOTE — Progress Notes (Deleted)
Office Visit Note  Patient: Megan [name]             Date of Birth: 1941-07-12           MRN: [id number]             PCP: Binnie Rail, MD Referring: Binnie Rail, MD Visit Date: 04/10/2018 Occupation: @GUAROCC @    Subjective:  No chief complaint on file.   History of Present Illness: Megan [name] is a 77 y.o. female ***   Activities of Daily Living:  Patient reports morning stiffness for *** {minute/hour:19697}.   Patient {ACTIONS;DENIES/REPORTS:21021675::"Denies"} nocturnal pain.  Difficulty dressing/grooming: {ACTIONS;DENIES/REPORTS:21021675::"Denies"} Difficulty climbing stairs: {ACTIONS;DENIES/REPORTS:21021675::"Denies"} Difficulty getting out of chair: {ACTIONS;DENIES/REPORTS:21021675::"Denies"} Difficulty using hands for taps, buttons, cutlery, and/or writing: {ACTIONS;DENIES/REPORTS:21021675::"Denies"}   No Rheumatology ROS completed.   PMFS History:  Patient Active Problem List   Diagnosis Date Noted  . Post herpetic neuralgia 03/30/2018  . Herpes zoster without complication 78/29/5621  . Right elbow pain 03/01/2018  . Carpal tunnel syndrome on right 11/30/2017  . Cough 08/22/2017  . Primary osteoarthritis of both hands 03/08/2017  . High risk medication use 03/07/2017  . Poor balance 02/07/2017  . Subarachnoid bleed ([place]) 01/11/2017  . Psoriatic arthropathy ([place]) 09/08/2016  . Hypertension 09/08/2016  . Diabetic neuropathy ([place]) 09/08/2016  . LADA (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), managed as type 1 ([place]) 05/04/2016  . Syncope 03/25/2016  . Malnutrition of moderate degree 02/29/2016  . Diabetic ketoacidosis with coma associated with diabetes mellitus due to underlying condition ([place])   . Abnormal finding on GI tract imaging   . Diarrhea   . Hematochezia   . RLS (restless legs syndrome) 12/11/2015  . Liver dysfunction 08/11/2015  . Lumbago 05/06/2015  . Peripheral edema 04/14/2015  . Psoriasis 04/14/2015  . Encounter for  antineoplastic immunotherapy 04/06/2015  . Emphysema of lung ([place]) 11/26/2014  . Radiation-induced esophageal stricture 10/01/2014  . Tachycardia 08/27/2014  . DNR (do not resuscitate) discussion 08/21/2014  . Cancer associated pain 07/29/2014  . Malignant cachexia ([place]) 07/29/2014  . Lung collapse 07/29/2014  . Fatigue 07/18/2014  . Neuropathy due to chemotherapeutic drug ([place]) 07/18/2014  . Leg cramps 07/18/2014  . Hypoalbuminemia 07/18/2014  . Thrombocytopenia, unspecified ([place]) 07/18/2014  . Dyspnea 04/28/2014  . Radiation esophagitis 04/10/2014  . Right-sided chest wall pain 04/01/2014  . Cancer of middle lobe of lung ([place]) 01/17/2014  . Malignant neoplasm of right upper lobe of lung ([place]) 11/28/2013    Past Medical History:  Diagnosis Date  . Cancer ([place]) dx'd 09/2013   Lung ca  . Concussion 2018  . Diabetes ([place]) 09/08/2016   type I diabetic patient reported on 09/13/16   . Diabetes mellitus    insulin  . Diabetic neuropathy ([place]) 09/08/2016  . Fall 2018  . GERD (gastroesophageal reflux disease)    no meds for  . History of colitis   . History of hiatal hernia   . Hx of radiation therapy 12/16/13-01/30/14   lung 66Gy  . Hypertension   . Malignant neoplasm of right upper lobe of lung ([place]) 11/28/2013  . Neuropathy   . Psoriasis   . Psoriatic arthritis ([place]) 09/08/2016  . Rheumatoid arthritis ([place])   . Shortness of breath dyspnea    with exertion    Family History  Problem Relation Age of Onset  . Other Mother   . Other Father        failure to thrive  . Hypertension Other   .  Stroke Other   . Heart attack Other   . Hemachromatosis Other   . Rheum arthritis Unknown        both sets of grandparents and father   Past Surgical History:  Procedure Laterality Date  . ABDOMINAL HYSTERECTOMY    . ABDOMINAL SURGERY    . BALLOON DILATION N/A 12/12/2014   Procedure: BALLOON DILATION;  Surgeon: Inda Castle, MD;  Location: [facility];  Service: Endoscopy;   Laterality: N/A;  . CHOLECYSTECTOMY    . COLONOSCOPY N/A 02/07/2016   Procedure: COLONOSCOPY;  Surgeon: Doran Stabler, MD;  [facility] ENDOSCOPY;  Service: Endoscopy;  Laterality: N/A;  . DILATION AND CURETTAGE OF UTERUS    . ESOPHAGOGASTRODUODENOSCOPY N/A 12/12/2014   Procedure: ESOPHAGOGASTRODUODENOSCOPY (EGD);  Surgeon: Inda Castle, MD;  Location: [facility];  Service: Endoscopy;  Laterality: N/A;  with dilation  . ESOPHAGOGASTRODUODENOSCOPY (EGD) WITH PROPOFOL N/A 10/09/2014   Procedure: ESOPHAGOGASTRODUODENOSCOPY (EGD) WITH PROPOFOL;  Surgeon: Inda Castle, MD;  Location: [facility];  Service: Endoscopy;  Laterality: N/A;  . ESOPHAGOGASTRODUODENOSCOPY (EGD) WITH PROPOFOL N/A 09/19/2016   Procedure: ESOPHAGOGASTRODUODENOSCOPY (EGD) WITH PROPOFOL;  Surgeon: Doran Stabler, MD;  Location: [facility] ENDOSCOPY;  Service: Gastroenterology;  Laterality: N/A;  with savary dil tt  . FRACTURE SURGERY    . ORIF ANKLE FRACTURE Right 07/04/2015   Procedure: OPEN REDUCTION INTERNAL FIXATION (ORIF) ANKLE FRACTURE;  Surgeon: Newt Minion, MD;  Location: [facility];  Service: Orthopedics;  Laterality: Right;  OPEN REDUCTION, INTERNAL FIXATION OF RIGHT ANKLE FRACTURE.   [name] ORIF ANKLE FRACTURE Left 02/10/2016   Procedure: OPEN REDUCTION INTERNAL FIXATION (ORIF) ANKLE FRACTURE;  Surgeon: Newt Minion, MD;  Location: [facility];  Service: Orthopedics;  Laterality: Left;  . SAVORY DILATION N/A 10/09/2014   Procedure: SAVORY DILATION;  Surgeon: Inda Castle, MD;  Location: [facility];  Service: Endoscopy;  Laterality: N/A;  . SAVORY DILATION N/A 09/19/2016   Procedure: SAVORY DILATION;  Surgeon: Doran Stabler, MD;  Location: [facility]OS[facility];  Service: Gastroenterology;  Laterality: N/A;  . TONSILLECTOMY     Social History   Social History Narrative   Patient lives at home alone.   Caffeine Use: 1 cups daily   Right handed     Objective: Vital Signs: There were no vitals taken for this visit.    Physical Exam   Musculoskeletal Exam: ***  CDAI Exam: No CDAI exam completed.    Investigation: No additional findings. CBC Latest Ref Rng & Units 01/11/2018 09/08/2017 05/08/2017  WBC 3.9 - 10.3 K/uL 7.0 7.1 8.1  Hemoglobin 11.6 - 15.9 g/dL 13.7 13.8 13.2  Hematocrit 34.8 - 46.6 % 43.6 43.0 40.1  Platelets 145 - 400 K/uL 176 155 188   CMP Latest Ref Rng & Units 01/11/2018 09/08/2017 05/08/2017  Glucose 70 - 140 mg/dL 112 182(H) 95  BUN 7 - 26 mg/dL 13 13.8 16.8  Creatinine 0.60 - 1.10 mg/dL 0.89 0.9 0.8  Sodium 136 - 145 mmol/L 143 141 144  Potassium 3.5 - 5.1 mmol/L 4.1 4.1 3.6  Chloride 98 - 109 mmol/L 106 - -  CO2 22 - 29 mmol/L 29 28 28   Calcium 8.4 - 10.4 mg/dL 9.7 9.4 9.5  Total Protein 6.4 - 8.3 g/dL 6.9 7.1 6.8  Total Bilirubin 0.2 - 1.2 mg/dL 0.5 0.45 0.45  Alkaline Phos 40 - 150 U/L 193(H) 182(H) 161(H)  AST 5 - 34 U/L 15 15 20   ALT 0 - 55 U/L 17  17 22    Imaging: No results found.  Speciality Comments: No specialty comments available.    Procedures:  No procedures performed Allergies: Carboplatin; Remicade [infliximab]; and Hydromorphone   Assessment / Plan:     Visit Diagnoses: No diagnosis found.    Orders: No orders of the defined types were placed in this encounter.  No orders of the defined types were placed in this encounter.   Face-to-face time spent with patient was *** minutes. 50% of time was spent in counseling and coordination of care.  Follow-Up Instructions: No follow-ups on file.   Earnestine Mealing, CMA  Note - This record has been created using Editor, commissioning.  Chart creation errors have been sought, but may not always  have been located. Such creation errors do not reflect on  the standard of medical care.

## 2018-04-10 ENCOUNTER — Ambulatory Visit: Payer: Medicare Other | Admitting: Physician Assistant

## 2018-04-16 ENCOUNTER — Other Ambulatory Visit: Payer: Self-pay | Admitting: Internal Medicine

## 2018-04-21 ENCOUNTER — Other Ambulatory Visit: Payer: Self-pay | Admitting: Neurology

## 2018-05-03 ENCOUNTER — Other Ambulatory Visit: Payer: Self-pay | Admitting: Internal Medicine

## 2018-05-11 ENCOUNTER — Other Ambulatory Visit: Payer: Self-pay | Admitting: Internal Medicine

## 2018-05-30 ENCOUNTER — Other Ambulatory Visit: Payer: Self-pay | Admitting: Internal Medicine

## 2018-06-12 ENCOUNTER — Other Ambulatory Visit: Payer: Self-pay | Admitting: Internal Medicine

## 2018-06-18 ENCOUNTER — Other Ambulatory Visit: Payer: Self-pay | Admitting: Internal Medicine

## 2018-06-19 NOTE — Telephone Encounter (Signed)
This is per Dr. Quay Burow

## 2018-06-19 NOTE — Telephone Encounter (Signed)
Is this okay to refill? 

## 2018-06-20 ENCOUNTER — Other Ambulatory Visit: Payer: Self-pay | Admitting: Internal Medicine

## 2018-07-06 ENCOUNTER — Telehealth: Payer: Self-pay | Admitting: Internal Medicine

## 2018-07-06 NOTE — Telephone Encounter (Signed)
R/s 10/2 appt to later time per MM - patient is aware of appt date and time.

## 2018-07-11 ENCOUNTER — Other Ambulatory Visit: Payer: Self-pay | Admitting: Neurology

## 2018-07-12 ENCOUNTER — Encounter: Payer: Self-pay | Admitting: *Deleted

## 2018-07-12 ENCOUNTER — Other Ambulatory Visit: Payer: Self-pay | Admitting: Neurology

## 2018-07-16 ENCOUNTER — Telehealth: Payer: Self-pay | Admitting: Internal Medicine

## 2018-07-16 NOTE — Telephone Encounter (Signed)
MM PAL 9/26 - f/u moved to 10/2 - patient aware of new d/t. Other appointments remain the same. See 9/6 telephone note.

## 2018-07-17 ENCOUNTER — Other Ambulatory Visit: Payer: Self-pay | Admitting: Internal Medicine

## 2018-07-17 ENCOUNTER — Telehealth: Payer: Self-pay

## 2018-07-17 NOTE — Telephone Encounter (Signed)
RX for insulin pen faxed.

## 2018-07-20 ENCOUNTER — Other Ambulatory Visit: Payer: Self-pay | Admitting: Internal Medicine

## 2018-07-23 ENCOUNTER — Other Ambulatory Visit: Payer: Self-pay

## 2018-07-23 ENCOUNTER — Other Ambulatory Visit: Payer: Self-pay | Admitting: Internal Medicine

## 2018-07-23 MED ORDER — GABAPENTIN 300 MG PO CAPS: 300.0000 mg | ORAL_CAPSULE | Freq: Three times a day (TID) | ORAL | 1 refills | Status: DC

## 2018-07-23 NOTE — Telephone Encounter (Signed)
OK 

## 2018-07-23 NOTE — Telephone Encounter (Signed)
Ok to fill?  Patient was due for follow up in August, does not have appt scheduled.

## 2018-07-24 ENCOUNTER — Ambulatory Visit (HOSPITAL_COMMUNITY)
Admission: RE | Admit: 2018-07-24 | Discharge: 2018-07-24 | Disposition: A | Discharge status: Home / Self Care | Payer: Medicare Other | Source: Ambulatory Visit | Attending: Internal Medicine | Admitting: Internal Medicine

## 2018-07-24 ENCOUNTER — Inpatient Hospital Stay: Payer: Medicare Other | Attending: Internal Medicine

## 2018-07-24 ENCOUNTER — Encounter (HOSPITAL_COMMUNITY): Payer: Self-pay

## 2018-07-24 DIAGNOSIS — I2510 Atherosclerotic heart disease of native coronary artery without angina pectoris: Secondary | ICD-10-CM | POA: Insufficient documentation

## 2018-07-24 DIAGNOSIS — C3490 Malignant neoplasm of unspecified part of unspecified bronchus or lung: Secondary | ICD-10-CM | POA: Insufficient documentation

## 2018-07-24 DIAGNOSIS — K760 Fatty (change of) liver, not elsewhere classified: Secondary | ICD-10-CM | POA: Diagnosis not present

## 2018-07-24 DIAGNOSIS — I700 Atherosclerosis of aorta: Secondary | ICD-10-CM | POA: Diagnosis not present

## 2018-07-24 LAB — CBC WITH DIFFERENTIAL (CANCER CENTER ONLY)
Basophils Absolute: 0.1 10*3/uL (ref 0.0–0.1)
Basophils Relative: 1 %
Eosinophils Absolute: 0.3 10*3/uL (ref 0.0–0.5)
Eosinophils Relative: 5 %
HCT: 43.1 % (ref 34.8–46.6)
HEMOGLOBIN: 14 g/dL (ref 11.6–15.9)
LYMPHS ABS: 1.4 10*3/uL (ref 0.9–3.3)
LYMPHS PCT: 20 %
MCH: 28.7 pg (ref 25.1–34.0)
MCHC: 32.5 g/dL (ref 31.5–36.0)
MCV: 88.3 fL (ref 79.5–101.0)
Monocytes Absolute: 0.7 10*3/uL (ref 0.1–0.9)
Monocytes Relative: 10 %
NEUTROS PCT: 64 %
Neutro Abs: 4.4 10*3/uL (ref 1.5–6.5)
Platelet Count: 201 10*3/uL (ref 145–400)
RBC: 4.88 MIL/uL (ref 3.70–5.45)
RDW: 15.7 % — ABNORMAL HIGH (ref 11.2–14.5)
WBC Count: 6.9 10*3/uL (ref 3.9–10.3)

## 2018-07-24 LAB — CMP (CANCER CENTER ONLY)
ALBUMIN: 3.6 g/dL (ref 3.5–5.0)
ALT: 15 U/L (ref 0–44)
ANION GAP: 10 (ref 5–15)
AST: 14 U/L — ABNORMAL LOW (ref 15–41)
Alkaline Phosphatase: 157 U/L — ABNORMAL HIGH (ref 38–126)
BUN: 14 mg/dL (ref 8–23)
CO2: 31 mmol/L (ref 22–32)
Calcium: 9.9 mg/dL (ref 8.9–10.3)
Chloride: 100 mmol/L (ref 98–111)
Creatinine: 1.07 mg/dL — ABNORMAL HIGH (ref 0.44–1.00)
GFR, Est AFR Am: 57 mL/min — ABNORMAL LOW (ref >60)
GFR, Estimated: 49 mL/min — ABNORMAL LOW (ref >60)
Glucose, Bld: 124 mg/dL — ABNORMAL HIGH (ref 70–99)
POTASSIUM: 3.6 mmol/L (ref 3.5–5.1)
SODIUM: 141 mmol/L (ref 135–145)
Total Bilirubin: 0.7 mg/dL (ref 0.3–1.2)
Total Protein: 7.2 g/dL (ref 6.5–8.1)

## 2018-07-24 MED ORDER — IOHEXOL 300 MG/ML  SOLN
75.0000 mL | Freq: Once | INTRAMUSCULAR | Status: AC | PRN
  Administered 2018-07-24: 75 mL via INTRAVENOUS

## 2018-07-25 ENCOUNTER — Other Ambulatory Visit: Payer: Self-pay | Admitting: Internal Medicine

## 2018-07-26 ENCOUNTER — Ambulatory Visit: Payer: Medicare Other | Admitting: Internal Medicine

## 2018-08-01 ENCOUNTER — Telehealth: Payer: Self-pay | Admitting: Internal Medicine

## 2018-08-01 ENCOUNTER — Inpatient Hospital Stay: Payer: Medicare Other | Attending: Internal Medicine | Admitting: Internal Medicine

## 2018-08-01 ENCOUNTER — Encounter: Payer: Self-pay | Admitting: Internal Medicine

## 2018-08-01 VITALS — BP 131/65 | HR 101 | Temp 97.7°F | Resp 22 | Ht 66.0 in | Wt 186.2 lb

## 2018-08-01 DIAGNOSIS — C3490 Malignant neoplasm of unspecified part of unspecified bronchus or lung: Secondary | ICD-10-CM

## 2018-08-01 DIAGNOSIS — I700 Atherosclerosis of aorta: Secondary | ICD-10-CM

## 2018-08-01 DIAGNOSIS — K760 Fatty (change of) liver, not elsewhere classified: Secondary | ICD-10-CM | POA: Diagnosis not present

## 2018-08-01 DIAGNOSIS — Z79899 Other long term (current) drug therapy: Secondary | ICD-10-CM | POA: Diagnosis not present

## 2018-08-01 DIAGNOSIS — Z923 Personal history of irradiation: Secondary | ICD-10-CM | POA: Diagnosis not present

## 2018-08-01 DIAGNOSIS — E119 Type 2 diabetes mellitus without complications: Secondary | ICD-10-CM | POA: Diagnosis not present

## 2018-08-01 DIAGNOSIS — C3491 Malignant neoplasm of unspecified part of right bronchus or lung: Secondary | ICD-10-CM | POA: Diagnosis present

## 2018-08-01 DIAGNOSIS — I2510 Atherosclerotic heart disease of native coronary artery without angina pectoris: Secondary | ICD-10-CM | POA: Diagnosis not present

## 2018-08-01 DIAGNOSIS — Z794 Long term (current) use of insulin: Secondary | ICD-10-CM | POA: Diagnosis not present

## 2018-08-01 DIAGNOSIS — I10 Essential (primary) hypertension: Secondary | ICD-10-CM | POA: Diagnosis not present

## 2018-08-01 DIAGNOSIS — Z9221 Personal history of antineoplastic chemotherapy: Secondary | ICD-10-CM

## 2018-08-01 DIAGNOSIS — Z9049 Acquired absence of other specified parts of digestive tract: Secondary | ICD-10-CM | POA: Diagnosis not present

## 2018-08-01 NOTE — Progress Notes (Signed)
[facility] Telephone:([phone]) [phone]   Fax:(336) [phone]  OFFICE PROGRESS NOTE  Binnie Rail, MD [place] [address]  DIAGNOSIS: Squamous cell lung cancer  Primary site: Lung (Right)  Staging method: AJCC 7th Edition  Clinical: Stage IIIB (T3, N3, M0)  Summary: Stage IIIB (T3, N3, M0)   PRIOR THERAPY:  1) Concurrent chemoradiation with weekly chemotherapy in the form of carboplatin for AUC of 2 and paclitaxel 45 mg/m2. Status post 7 week of treatment. Last dose was given 01/27/2014 no significant response in her disease.  2) Consolidation chemotherapy with carboplatin for AUC of 5 and paclitaxel 175 mg/M2 every 3 weeks with Neulasta support. First dose on 04/15/2014. Status post 3 cycles. Carboplatin was discontinued starting cycle #2 secondary to hypersensitivity reaction. 3) Immunotherapy with Opdivo (Nivolumab) 3mg /kg given every 2 weeks. Status post 33 cycles discontinued secondary to intolerance.  CURRENT THERAPY: Observation.  INTERVAL HISTORY: Megan [name] 77 y.o. female returns to the clinic today for follow-up visit.  The patient is feeling fine with no concerning complaints.  She denied having any recent weight loss or night sweats.  She has no nausea, vomiting, diarrhea or constipation.  She denied having any chest pain, shortness of breath, cough or hemoptysis.  She had repeat CT scan of the chest performed recently and she is here for evaluation and discussion of her risk her results.  MEDICAL HISTORY: Past Medical History:  Diagnosis Date  . Cancer ([place]) dx'd 09/2013   Lung ca  . Concussion 2018  . Diabetes ([place]) 09/08/2016   type I diabetic patient reported on 09/13/16   . Diabetes mellitus    insulin  . Diabetic neuropathy ([place]) 09/08/2016  . Fall 2018  . GERD (gastroesophageal reflux disease)    no meds for  . History of colitis   . History of hiatal hernia   . Hx of radiation therapy 12/16/13-01/30/14   lung  66Gy  . Hypertension   . Malignant neoplasm of right upper lobe of lung ([place]) 11/28/2013  . Neuropathy   . Psoriasis   . Psoriatic arthritis ([place]) 09/08/2016  . Rheumatoid arthritis ([place])   . Shortness of breath dyspnea    with exertion    ALLERGIES:  is allergic to carboplatin; remicade [infliximab]; and hydromorphone.  MEDICATIONS:  Current Outpatient Medications  Medication Sig Dispense Refill  . amLODipine (NORVASC) 10 MG tablet Take 1 tablet (10 mg total) by mouth daily. 90 tablet 1  . augmented betamethasone dipropionate (DIPROLENE-AF) 0.05 % ointment APP AA BID PRN 30 g 1  . B-D UF III MINI PEN NEEDLES 31G X 5 MM MISC USE TO CHECK SUGAR FIVE TIMES DAILY 100 each 0  . betamethasone dipropionate (DIPROLENE) 0.05 % cream Apply topically 2 (two) times daily. 45 g 0  . budesonide-formoterol (SYMBICORT) 80-4.5 MCG/ACT inhaler Inhale 2 puffs into the lungs 2 (two) times daily. 1 Inhaler 6  . DULoxetine (CYMBALTA) 60 MG capsule TAKE 1 CAPSULE BY MOUTH TWICE DAILY 180 capsule 0  . gabapentin (NEURONTIN) 300 MG capsule Take 1 capsule (300 mg total) by mouth 3 (three) times daily. 90 capsule 1  . glucose blood test strip Use as instructed 4x a day - Verio 200 each 5  . Insulin Detemir (LEVEMIR FLEXTOUCH) 100 UNIT/ML Pen Inject 17 units in the morning, and 28 units in the pm 45 mL 0  . insulin lispro (HUMALOG KWIKPEN) 100 UNIT/ML KiwkPen INJECT 15 TO 26 UNITS UNDER  THE SKIN THREE TIMES DAILY 45 mL 0  . Insulin Pen Needle (B-D UF III MINI PEN NEEDLES) 31G X 5 MM MISC USE FIVE TIMES DAILY AS DIRECTED NEED APPOINTMENT FOR FURTHER REFILLS 200 each 0  . ONETOUCH DELICA LANCETS FINE MISC USE TO CHECK BLOOD SUGAR FOUR TIMES DAILY 300 each 5  . ONETOUCH VERIO test strip USE FIVE TIMES DAILY AS DIRECTED 400 each 1  . ONETOUCH VERIO test strip USE FIVE TIMES DAILY AS DIRECTED 400 each 0  . sulfaSALAzine (AZULFIDINE) 500 MG EC tablet TAKE 2 TABLETS(1000 MG) BY MOUTH TWICE DAILY 120 tablet 0  .  HYDROcodone-acetaminophen (NORCO/VICODIN) 5-325 MG tablet Take 1 tablet by mouth at bedtime as needed for moderate pain. 5 tablet 0  . valACYclovir (VALTREX) 1000 MG tablet Take 1 tablet (1,000 mg total) by mouth 3 (three) times daily. 21 tablet 0   No current facility-administered medications for this visit.     SURGICAL HISTORY:  Past Surgical History:  Procedure Laterality Date  . ABDOMINAL HYSTERECTOMY    . ABDOMINAL SURGERY    . BALLOON DILATION N/A 12/12/2014   Procedure: BALLOON DILATION;  Surgeon: Inda Castle, MD;  Location: [facility];  Service: Endoscopy;  Laterality: N/A;  . CHOLECYSTECTOMY    . COLONOSCOPY N/A 02/07/2016   Procedure: COLONOSCOPY;  Surgeon: Doran Stabler, MD;  [facility];  Service: Endoscopy;  Laterality: N/A;  . DILATION AND CURETTAGE OF UTERUS    . ESOPHAGOGASTRODUODENOSCOPY N/A 12/12/2014   Procedure: ESOPHAGOGASTRODUODENOSCOPY (EGD);  Surgeon: Inda Castle, MD;  Location: [facility];  Service: Endoscopy;  Laterality: N/A;  with dilation  . ESOPHAGOGASTRODUODENOSCOPY (EGD) WITH PROPOFOL N/A 10/09/2014   Procedure: ESOPHAGOGASTRODUODENOSCOPY (EGD) WITH PROPOFOL;  Surgeon: Inda Castle, MD;  Location: [place];  Service: Endoscopy;  Laterality: N/A;  . ESOPHAGOGASTRODUODENOSCOPY (EGD) WITH PROPOFOL N/A 09/19/2016   Procedure: ESOPHAGOGASTRODUODENOSCOPY (EGD) WITH PROPOFOL;  Surgeon: Doran Stabler, MD;  [facility];  Service: Gastroenterology;  Laterality: N/A;  with savary dil tt  . FRACTURE SURGERY    . ORIF ANKLE FRACTURE Right 07/04/2015   Procedure: OPEN REDUCTION INTERNAL FIXATION (ORIF) ANKLE FRACTURE;  Surgeon: Newt Minion, MD;  Location: [facility];  Service: Orthopedics;  Laterality: Right;  OPEN REDUCTION, INTERNAL FIXATION OF RIGHT ANKLE FRACTURE.   [name] ORIF ANKLE FRACTURE Left 02/10/2016   Procedure: OPEN REDUCTION INTERNAL FIXATION (ORIF) ANKLE FRACTURE;  Surgeon: Newt Minion, MD;  Location: [place];  Service:  Orthopedics;  Laterality: Left;  . SAVORY DILATION N/A 10/09/2014   Procedure: SAVORY DILATION;  Surgeon: Inda Castle, MD;  Location: [place];  Service: Endoscopy;  Laterality: N/A;  . SAVORY DILATION N/A 09/19/2016   Procedure: SAVORY DILATION;  Surgeon: Doran Stabler, MD;  Location: [facility];  Service: Gastroenterology;  Laterality: N/A;  . TONSILLECTOMY      REVIEW OF SYSTEMS:  A comprehensive review of systems was negative.   PHYSICAL EXAMINATION: General appearance: alert, cooperative and no distress Head: Normocephalic, without obvious abnormality, atraumatic Neck: no adenopathy, no JVD, supple, symmetrical, trachea midline and thyroid not enlarged, symmetric, no tenderness/mass/nodules Lymph nodes: Cervical, supraclavicular, and axillary nodes normal. Resp: clear to auscultation bilaterally Back: symmetric, no curvature. ROM normal. No CVA tenderness. Cardio: regular rate and rhythm, S1, S2 normal, no murmur, click, rub or gallop GI: soft, non-tender; bowel sounds normal; no masses,  no organomegaly Extremities: extremities normal, atraumatic, no cyanosis or edema    ECOG PERFORMANCE STATUS:  1 - Symptomatic but completely ambulatory  Blood pressure 131/65, pulse (!) 101, temperature 97.7 F (36.5 C), temperature source Oral, resp. rate (!) 22, height 5\' 6"  (1.676 m), weight 186 lb 3.2 oz (84.5 kg), SpO2 97 %.  LABORATORY DATA: Lab Results  Component Value Date   WBC 6.9 07/24/2018   HGB 14.0 07/24/2018   HCT 43.1 07/24/2018   MCV 88.3 07/24/2018   PLT 201 07/24/2018      Chemistry      Component Value Date/Time   NA 141 07/24/2018 1119   NA 141 09/08/2017 1213   K 3.6 07/24/2018 1119   K 4.1 09/08/2017 1213   CL 100 07/24/2018 1119   CO2 31 07/24/2018 1119   CO2 28 09/08/2017 1213   BUN 14 07/24/2018 1119   BUN 13.8 09/08/2017 1213   CREATININE 1.07 (H) 07/24/2018 1119   CREATININE 0.9 09/08/2017 1213   GLU 176 03/07/2016      Component Value  Date/Time   CALCIUM 9.9 07/24/2018 1119   CALCIUM 9.4 09/08/2017 1213   ALKPHOS 157 (H) 07/24/2018 1119   ALKPHOS 182 (H) 09/08/2017 1213   AST 14 (L) 07/24/2018 1119   AST 15 09/08/2017 1213   ALT 15 07/24/2018 1119   ALT 17 09/08/2017 1213   BILITOT 0.7 07/24/2018 1119   BILITOT 0.45 09/08/2017 1213       RADIOGRAPHIC STUDIES: Ct Chest W Contrast  Result Date: 07/25/2018 CLINICAL DATA:  77 year old female with history of right-sided lung cancer diagnosed in 2015 status post radiation therapy (completed in April 2015). Chemotherapy completed in 2016. EXAM: CT CHEST WITH CONTRAST TECHNIQUE: Multidetector CT imaging of the chest was performed during intravenous contrast administration. CONTRAST:  22mL OMNIPAQUE IOHEXOL 300 MG/ML  SOLN COMPARISON:  Chest CT 01/11/2018. FINDINGS: Cardiovascular: Heart size is normal. There is no significant pericardial fluid, thickening or pericardial calcification. There is aortic atherosclerosis, as well as atherosclerosis of the great vessels of the mediastinum and the coronary arteries, including calcified atherosclerotic plaque in the left anterior descending coronary artery. Mediastinum/Nodes: No pathologically enlarged mediastinal or hilar lymph nodes. Esophagus is unremarkable in appearance. No axillary lymphadenopathy. Lungs/Pleura: Chronic architectural distortion and volume loss in the right lung involving the entire right upper lobe and portions of the right middle and lower lobes, compatible with chronic postradiation mass-like fibrosis. A few scattered 2-4 mm pulmonary nodules are noted throughout the left lung, nonspecific but similar to the prior study. Calcified granuloma in the left lower lobe. Upper Abdomen: Diffuse low attenuation throughout the hepatic parenchyma, indicative of hepatic steatosis. Several low-attenuation lesions are noted throughout the liver, most of which are too small to definitively characterize, but statistically favored to  represent cysts. 2.0 x 1.4 cm low-attenuation lesion in the anterior aspect of segment 2 of the liver, compatible with a simple cyst. Status post cholecystectomy. Calcified granulomas noted in the spleen. Aortic atherosclerosis. Musculoskeletal: There are no aggressive appearing lytic or blastic lesions noted in the visualized portions of the skeleton. IMPRESSION: 1. Chronic postradiation changes in the right lung. No definite findings to suggest locally recurrent or metastatic disease in the thorax. 2. Aortic atherosclerosis, in addition to left anterior descending coronary artery disease. Assessment for potential risk factor modification, dietary therapy or pharmacologic therapy may be warranted, if clinically indicated. 3. Hepatic steatosis. 4. Additional incidental findings, as above. Aortic Atherosclerosis (ICD10-I70.0). Electronically Signed   By: Vinnie Langton M.D.   On: 07/25/2018 08:43    ASSESSMENT AND PLAN:  This  is a very pleasant 77 years old white female with metastatic non-small cell lung cancer status post a course of concurrent chemoradiation as well as consolidation chemotherapy discontinued secondary to intolerance and significant neuropathy. The patient was treated with immunotherapy with Nivolumab status post 33 cycles and tolerated the treatment well but it was discontinued secondary to chemotherapy-induced colitis and diarrhea. She is currently on observation.  The patient is feeling fine with no concerning complaints. She had repeat CT scan of the chest performed recently.  I personally and independently reviewed the scan and discussed the results with the patient today.  Her scan showed no concerning findings for disease progression. I recommended for the patient to continue on observation with repeat CT scan of the chest in 6 months. The patient was advised to call immediately if she has any concerning symptoms in the interval. The patient voices understanding of current  disease status and treatment options and is in agreement with the current care plan. All questions were answered. The patient knows to call the clinic with any problems, questions or concerns. We can certainly see the patient much sooner if necessary. I spent 10 minutes counseling the patient face to face. The total time spent in the appointment was 15 minutes.  Disclaimer: This note was dictated with voice recognition software. Similar sounding words can inadvertently be transcribed and may not be corrected upon review.

## 2018-08-01 NOTE — Telephone Encounter (Signed)
Gave pt avs and calendar  °

## 2018-09-05 ENCOUNTER — Other Ambulatory Visit: Payer: Self-pay | Admitting: Neurology

## 2018-09-05 ENCOUNTER — Ambulatory Visit: Payer: Self-pay | Admitting: Neurology

## 2018-09-05 MED ORDER — DULOXETINE HCL 60 MG PO CPEP
60.0000 mg | ORAL_CAPSULE | Freq: Two times a day (BID) | ORAL | 4 refills | Status: DC
Start: 2018-09-05 — End: 2019-10-02

## 2018-09-05 MED ORDER — GABAPENTIN 300 MG PO CAPS: 300.0000 mg | ORAL_CAPSULE | Freq: Three times a day (TID) | ORAL | 4 refills | Status: DC

## 2018-09-13 ENCOUNTER — Telehealth: Payer: Self-pay | Admitting: Internal Medicine

## 2018-09-13 MED ORDER — INSULIN PEN NEEDLE 31G X 5 MM MISC: 4 refills | Status: DC

## 2018-09-13 NOTE — Telephone Encounter (Signed)
We absolutely have NOT received anything form her pharmacy for this!!

## 2018-09-13 NOTE — Telephone Encounter (Signed)
Patient has called stating [facility] has faxed on 11/8 and 11/10 and her Insulin Pen Needle (B-D UF III MINI PEN NEEDLES) 31G X 5 MM MISC have still not been filled. Patient is currently still using the same pens and states, "this is not healthy." Please advise, thanks

## 2018-09-23 ENCOUNTER — Other Ambulatory Visit: Payer: Self-pay | Admitting: Internal Medicine

## 2018-10-10 ENCOUNTER — Emergency Department (HOSPITAL_COMMUNITY): Payer: Medicare Other

## 2018-10-10 ENCOUNTER — Encounter (HOSPITAL_COMMUNITY): Payer: Self-pay

## 2018-10-10 ENCOUNTER — Inpatient Hospital Stay (HOSPITAL_COMMUNITY)
Admission: EM | Admit: 2018-10-10 | Discharge: 2018-10-13 | DRG: 871 | Disposition: A | Discharge status: Home Health | Payer: Medicare Other | Attending: Internal Medicine | Admitting: Internal Medicine

## 2018-10-10 DIAGNOSIS — Z9181 History of falling: Secondary | ICD-10-CM

## 2018-10-10 DIAGNOSIS — E876 Hypokalemia: Secondary | ICD-10-CM | POA: Diagnosis not present

## 2018-10-10 DIAGNOSIS — G9340 Encephalopathy, unspecified: Secondary | ICD-10-CM | POA: Diagnosis present

## 2018-10-10 DIAGNOSIS — I152 Hypertension secondary to endocrine disorders: Secondary | ICD-10-CM | POA: Diagnosis present

## 2018-10-10 DIAGNOSIS — R739 Hyperglycemia, unspecified: Secondary | ICD-10-CM

## 2018-10-10 DIAGNOSIS — Z794 Long term (current) use of insulin: Secondary | ICD-10-CM

## 2018-10-10 DIAGNOSIS — A419 Sepsis, unspecified organism: Secondary | ICD-10-CM | POA: Diagnosis not present

## 2018-10-10 DIAGNOSIS — R6520 Severe sepsis without septic shock: Secondary | ICD-10-CM | POA: Diagnosis present

## 2018-10-10 DIAGNOSIS — Z7951 Long term (current) use of inhaled steroids: Secondary | ICD-10-CM

## 2018-10-10 DIAGNOSIS — Z888 Allergy status to other drugs, medicaments and biological substances status: Secondary | ICD-10-CM

## 2018-10-10 DIAGNOSIS — C3411 Malignant neoplasm of upper lobe, right bronchus or lung: Secondary | ICD-10-CM | POA: Diagnosis present

## 2018-10-10 DIAGNOSIS — R0902 Hypoxemia: Secondary | ICD-10-CM

## 2018-10-10 DIAGNOSIS — E1040 Type 1 diabetes mellitus with diabetic neuropathy, unspecified: Secondary | ICD-10-CM | POA: Diagnosis present

## 2018-10-10 DIAGNOSIS — E139 Other specified diabetes mellitus without complications: Secondary | ICD-10-CM | POA: Diagnosis present

## 2018-10-10 DIAGNOSIS — L4050 Arthropathic psoriasis, unspecified: Secondary | ICD-10-CM | POA: Diagnosis present

## 2018-10-10 DIAGNOSIS — J9601 Acute respiratory failure with hypoxia: Secondary | ICD-10-CM | POA: Diagnosis present

## 2018-10-10 DIAGNOSIS — E1022 Type 1 diabetes mellitus with diabetic chronic kidney disease: Secondary | ICD-10-CM | POA: Diagnosis present

## 2018-10-10 DIAGNOSIS — C799 Secondary malignant neoplasm of unspecified site: Secondary | ICD-10-CM | POA: Diagnosis present

## 2018-10-10 DIAGNOSIS — J440 Chronic obstructive pulmonary disease with acute lower respiratory infection: Secondary | ICD-10-CM | POA: Diagnosis present

## 2018-10-10 DIAGNOSIS — Z923 Personal history of irradiation: Secondary | ICD-10-CM

## 2018-10-10 DIAGNOSIS — I10 Essential (primary) hypertension: Secondary | ICD-10-CM | POA: Diagnosis present

## 2018-10-10 DIAGNOSIS — Z87891 Personal history of nicotine dependence: Secondary | ICD-10-CM

## 2018-10-10 DIAGNOSIS — Z85118 Personal history of other malignant neoplasm of bronchus and lung: Secondary | ICD-10-CM

## 2018-10-10 DIAGNOSIS — J9819 Other pulmonary collapse: Secondary | ICD-10-CM | POA: Diagnosis present

## 2018-10-10 DIAGNOSIS — Z6829 Body mass index (BMI) 29.0-29.9, adult: Secondary | ICD-10-CM

## 2018-10-10 DIAGNOSIS — R509 Fever, unspecified: Secondary | ICD-10-CM

## 2018-10-10 DIAGNOSIS — M069 Rheumatoid arthritis, unspecified: Secondary | ICD-10-CM | POA: Diagnosis present

## 2018-10-10 DIAGNOSIS — Z885 Allergy status to narcotic agent status: Secondary | ICD-10-CM

## 2018-10-10 DIAGNOSIS — E1065 Type 1 diabetes mellitus with hyperglycemia: Secondary | ICD-10-CM | POA: Diagnosis present

## 2018-10-10 DIAGNOSIS — E1122 Type 2 diabetes mellitus with diabetic chronic kidney disease: Secondary | ICD-10-CM | POA: Diagnosis present

## 2018-10-10 DIAGNOSIS — Z79899 Other long term (current) drug therapy: Secondary | ICD-10-CM

## 2018-10-10 DIAGNOSIS — N183 Chronic kidney disease, stage 3 (moderate): Secondary | ICD-10-CM | POA: Diagnosis present

## 2018-10-10 DIAGNOSIS — R1310 Dysphagia, unspecified: Secondary | ICD-10-CM | POA: Diagnosis present

## 2018-10-10 DIAGNOSIS — R64 Cachexia: Secondary | ICD-10-CM | POA: Diagnosis present

## 2018-10-10 DIAGNOSIS — E872 Acidosis: Secondary | ICD-10-CM | POA: Diagnosis present

## 2018-10-10 DIAGNOSIS — J189 Pneumonia, unspecified organism: Secondary | ICD-10-CM | POA: Diagnosis present

## 2018-10-10 DIAGNOSIS — I129 Hypertensive chronic kidney disease with stage 1 through stage 4 chronic kidney disease, or unspecified chronic kidney disease: Secondary | ICD-10-CM | POA: Diagnosis present

## 2018-10-10 LAB — CBC WITH DIFFERENTIAL/PLATELET
Abs Immature Granulocytes: 0.17 10*3/uL — ABNORMAL HIGH (ref 0.00–0.07)
Basophils Absolute: 0.1 10*3/uL (ref 0.0–0.1)
Basophils Relative: 1 %
Eosinophils Absolute: 0.1 10*3/uL (ref 0.0–0.5)
Eosinophils Relative: 0 %
HCT: 42.6 % (ref 36.0–46.0)
Hemoglobin: 12.6 g/dL (ref 12.0–15.0)
Immature Granulocytes: 1 %
LYMPHS PCT: 5 %
Lymphs Abs: 0.7 10*3/uL (ref 0.7–4.0)
MCH: 27 pg (ref 26.0–34.0)
MCHC: 29.6 g/dL — AB (ref 30.0–36.0)
MCV: 91.2 fL (ref 80.0–100.0)
MONOS PCT: 7 %
Monocytes Absolute: 1 10*3/uL (ref 0.1–1.0)
NRBC: 0 % (ref 0.0–0.2)
Neutro Abs: 12.8 10*3/uL — ABNORMAL HIGH (ref 1.7–7.7)
Neutrophils Relative %: 86 %
Platelets: 225 10*3/uL (ref 150–400)
RBC: 4.67 MIL/uL (ref 3.87–5.11)
RDW: 15.9 % — AB (ref 11.5–15.5)
WBC: 14.8 10*3/uL — ABNORMAL HIGH (ref 4.0–10.5)

## 2018-10-10 LAB — I-STAT CG4 LACTIC ACID, ED: Lactic Acid, Venous: 1.99 mmol/L — ABNORMAL HIGH (ref 0.5–1.9)

## 2018-10-10 LAB — PROTIME-INR
INR: 1.17
PROTHROMBIN TIME: 14.8 s (ref 11.4–15.2)

## 2018-10-10 LAB — COMPREHENSIVE METABOLIC PANEL
ALT: 20 U/L (ref 0–44)
AST: 21 U/L (ref 15–41)
Albumin: 3.1 g/dL — ABNORMAL LOW (ref 3.5–5.0)
Alkaline Phosphatase: 130 U/L — ABNORMAL HIGH (ref 38–126)
Anion gap: 17 — ABNORMAL HIGH (ref 5–15)
BUN: 14 mg/dL (ref 8–23)
CALCIUM: 8.3 mg/dL — AB (ref 8.9–10.3)
CO2: 23 mmol/L (ref 22–32)
Chloride: 95 mmol/L — ABNORMAL LOW (ref 98–111)
Creatinine, Ser: 1.24 mg/dL — ABNORMAL HIGH (ref 0.44–1.00)
GFR calc Af Amer: 49 mL/min — ABNORMAL LOW (ref >60)
GFR calc non Af Amer: 42 mL/min — ABNORMAL LOW (ref >60)
Glucose, Bld: 550 mg/dL (ref 70–99)
Potassium: 3.6 mmol/L (ref 3.5–5.1)
Sodium: 135 mmol/L (ref 135–145)
Total Bilirubin: 1.9 mg/dL — ABNORMAL HIGH (ref 0.3–1.2)
Total Protein: 6.8 g/dL (ref 6.5–8.1)

## 2018-10-10 MED ORDER — SODIUM CHLORIDE 0.9 % IV SOLN
2.0000 g | Freq: Once | INTRAVENOUS | Status: AC
  Administered 2018-10-10: 2 g via INTRAVENOUS
  Filled 2018-10-10: qty 2

## 2018-10-10 MED ORDER — SODIUM CHLORIDE 0.9 % IV BOLUS (SEPSIS)
1000.0000 mL | Freq: Once | INTRAVENOUS | Status: AC
  Administered 2018-10-10: 1000 mL via INTRAVENOUS

## 2018-10-10 MED ORDER — METRONIDAZOLE IN NACL 5-0.79 MG/ML-% IV SOLN
500.0000 mg | Freq: Three times a day (TID) | INTRAVENOUS | Status: DC
  Administered 2018-10-10 – 2018-10-11 (×3): 500 mg via INTRAVENOUS
  Filled 2018-10-10 (×3): qty 100

## 2018-10-10 MED ORDER — VANCOMYCIN HCL IN DEXTROSE 1-5 GM/200ML-% IV SOLN
1000.0000 mg | Freq: Once | INTRAVENOUS | Status: AC
  Administered 2018-10-10: 1000 mg via INTRAVENOUS
  Filled 2018-10-10: qty 200

## 2018-10-10 MED ORDER — INSULIN REGULAR(HUMAN) IN NACL 100-0.9 UT/100ML-% IV SOLN
INTRAVENOUS | Status: DC
  Administered 2018-10-11: 4.5 [IU]/h via INTRAVENOUS
  Filled 2018-10-10: qty 100

## 2018-10-10 NOTE — ED Triage Notes (Signed)
Pt comes from home via [facility], since yesterday has been having fevers, cough, weakness, incontinence, altered, hyperglycemic. Pt had a fall both yesterday and today, unknown LOC, 86% on RA, CBG 560

## 2018-10-10 NOTE — Progress Notes (Signed)
Pharmacy Antibiotic Note  Megan [name] is a 77 y.o. female admitted on 10/10/2018 with sepsis.  Pharmacy has been consulted for vancomycin and cefepime dosing. Cefepime 2gm and vancomycin 1gm ordered in the ED  Plan: Continue cefepime 2gm q24 hours Continue vancomycin 750 mg IV q24 hours F/u renal function, cultures and clinical course  Height: 5\' 6"  (167.6 cm) Weight: 183 lb 7 oz (83.2 kg) IBW/kg (Calculated) : 59.3  Temp (24hrs), Avg:100.9 F (38.3 C), Min:100.9 F (38.3 C), Max:100.9 F (38.3 C)  Recent Labs  Lab 10/10/18 2254 10/10/18 2305  WBC 14.8*  --   LATICACIDVEN  --  1.99*    CrCl cannot be calculated (Patient's most recent lab result is older than the maximum 21 days allowed.).    Allergies  Allergen Reactions  . Carboplatin Other (See Comments)    Neuropathy  . Remicade [Infliximab] Anaphylaxis  . Hydromorphone Rash     Thank you for allowing pharmacy to be a part of this patient's care.  Excell Seltzer [place] 10/10/2018 11:19 PM

## 2018-10-10 NOTE — ED Provider Notes (Signed)
TIME SEEN: 11:19 PM  CHIEF COMPLAINT: Fever, cough  HPI: Patient is a 77 year old female with history of lung cancer not currently receiving treatment, insulin-dependent diabetes, rheumatoid arthritis who presents to the emergency department with fevers, cough, shortness of breath for the past day.  Does not wear oxygen at home.  Is hypoxic here.  Denies any pain currently.  States she did have vomiting and diarrhea yesterday.  She reports she has had an influenza vaccination.  ROS: See HPI Constitutional: fever  Eyes: no drainage  ENT: no runny nose   Cardiovascular:  no chest pain  Resp:  SOB  GI: no vomiting GU: no dysuria Integumentary: no rash  Allergy: no hives  Musculoskeletal: no leg swelling  Neurological: no slurred speech ROS otherwise negative  PAST MEDICAL HISTORY/PAST SURGICAL HISTORY:  Past Medical History:  Diagnosis Date  . Cancer ([place]) dx'd 09/2013   Lung ca  . Concussion 2018  . Diabetes ([place]) 09/08/2016   type I diabetic patient reported on 09/13/16   . Diabetes mellitus    insulin  . Diabetic neuropathy ([place]) 09/08/2016  . Fall 2018  . GERD (gastroesophageal reflux disease)    no meds for  . History of colitis   . History of hiatal hernia   . Hx of radiation therapy 12/16/13-01/30/14   lung 66Gy  . Hypertension   . Malignant neoplasm of right upper lobe of lung ([place]) 11/28/2013  . Neuropathy   . Psoriasis   . Psoriatic arthritis ([place]) 09/08/2016  . Rheumatoid arthritis ([place])   . Shortness of breath dyspnea    with exertion    MEDICATIONS:  Prior to Admission medications   Medication Sig Start Date End Date Taking? Authorizing Provider  augmented betamethasone dipropionate (DIPROLENE-AF) 0.05 % ointment APP AA BID PRN 03/19/18   Binnie Rail, MD  betamethasone dipropionate (DIPROLENE) 0.05 % cream Apply topically 2 (two) times daily. 03/01/18   Binnie Rail, MD  budesonide-formoterol (SYMBICORT) 80-4.5 MCG/ACT inhaler Inhale 2 puffs into the lungs 2  (two) times daily. 10/18/17   Brand Males, MD  DULoxetine (CYMBALTA) 60 MG capsule Take 1 capsule (60 mg total) by mouth 2 (two) times daily. 09/05/18   Melvenia Beam, MD  gabapentin (NEURONTIN) 300 MG capsule Take 1 capsule (300 mg total) by mouth 3 (three) times daily. 09/05/18   Melvenia Beam, MD  glucose blood test strip Use as instructed 4x a day - Verio 08/17/17   Philemon Kingdom, MD  Insulin Detemir (LEVEMIR FLEXTOUCH) 100 UNIT/ML Pen Inject 17 units in the morning, and 28 units in the pm 07/25/18   Philemon Kingdom, MD  insulin lispro (HUMALOG KWIKPEN) 100 UNIT/ML KwikPen INJECT 15 TO 26 UNITS UNDER THE SKIN THREE TIMES DAILY 09/24/18   Philemon Kingdom, MD  Insulin Pen Needle (B-D UF III MINI PEN NEEDLES) 31G X 5 MM MISC USE TO CHECK SUGAR FIVE TIMES DAILY 09/13/18   Philemon Kingdom, MD  [facility] DELICA LANCETS FINE MISC USE TO CHECK BLOOD SUGAR FOUR TIMES DAILY 02/20/18   Philemon Kingdom, MD  [facility] VERIO test strip USE FIVE TIMES DAILY AS DIRECTED 02/20/18   Philemon Kingdom, MD  [facility] VERIO test strip USE FIVE TIMES DAILY AS DIRECTED 05/11/18   Philemon Kingdom, MD    ALLERGIES:  Allergies  Allergen Reactions  . Carboplatin Other (See Comments)    Neuropathy  . Remicade [Infliximab] Anaphylaxis  . Hydromorphone Rash    SOCIAL HISTORY:  Social History   Tobacco Use  .  Smoking status: Former Smoker    Packs/day: 3.00    Years: 45.00    Pack years: 135.00    Types: Cigarettes    Last attempt to quit: 12/05/1997    Years since quitting: 20.8  . Smokeless tobacco: Never Used  Substance Use Topics  . Alcohol use: No    Alcohol/week: 0.0 standard drinks    Comment: rarely     FAMILY HISTORY: Family History  Problem Relation Age of Onset  . Other Mother   . Other Father        failure to thrive  . Hypertension Other   . Stroke Other   . Heart attack Other   . Hemachromatosis Other   . Rheum arthritis Unknown        both sets of grandparents and  father    EXAM: BP 130/67   Pulse (!) 110   Temp (!) 100.9 F (38.3 C) (Rectal)   Resp 15   Ht 5\' 6"  (1.676 m)   Wt 83.2 kg   SpO2 (!) 84%   BMI 29.61 kg/m  CONSTITUTIONAL: Alert and oriented x3 but appears intermittently confused, febrile HEAD: Normocephalic EYES: Conjunctivae clear, pupils appear equal, EOMI ENT: normal nose; dry mucous membranes NECK: Supple, no meningismus, no nuchal rigidity, no LAD  CARD: Regular and tachycardic; S1 and S2 appreciated; no murmurs, no clicks, no rubs, no gallops RESP: No tachypnea, patient is able to speak full sentences, no hypoxia, lungs are clear without rhonchi, rales or wheezes, patient is hypoxic on room air ABD/GI: Normal bowel sounds; non-distended; soft, non-tender, no rebound, no guarding, no peritoneal signs, no hepatosplenomegaly BACK:  The back appears normal and is non-tender to palpation, there is no CVA tenderness EXT: Normal ROM in all joints; non-tender to palpation; no edema; normal capillary refill; no cyanosis, no calf tenderness or swelling    SKIN: Normal color for age and race; warm; no rash, no signs of cellulitis NEURO: Moves all extremities equally PSYCH: The patient's mood and manner are appropriate. Grooming and personal hygiene are appropriate.  MEDICAL DECISION MAKING: Patient here with fevers, cough, new oxygen requirement.  Patient meets sirs criteria.  Will start septic work-up.  Will obtain labs, urine, chest x-ray, flu swab.  Given new oxygen requirement, patient will need admission.  Will give IV fluids and IV antibiotics.  In 2017 patient had a normal ejection fraction.  ED PROGRESS: Labs show leukocytosis of 14,000 with left shift.  Mildly elevated lactate.  Patient's glucose is 550 without DKA.  Will start IV insulin.  She is receiving IV fluids and broad-spectrum antibiotics.  Chest x-ray shows no acute abnormality.  We will proceed with CT imaging to evaluate further for possible pneumonia, PE.  Patient  will need admission.  12:40 AM Discussed patient's case with hospitalist, Dr. Hal Hope.  I have recommended admission and patient (and family if present) agree with this plan. Admitting physician will place admission orders.   I reviewed all nursing notes, vitals, pertinent previous records, EKGs, lab and urine results, imaging (as available).  CT scan shows no pulmonary embolus but is concerning for tumor recurrence.   EKG Interpretation  Date/Time:  Wednesday October 10 2018 22:49:31 EST Ventricular Rate:  109 PR Interval:    QRS Duration: 96 QT Interval:  370 QTC Calculation: 499 R Axis:   24 Text Interpretation:  Sinus tachycardia Probable left atrial enlargement Borderline prolonged QT interval No significant change since last tracing Confirmed by Pryor Curia [phone]) on 10/10/2018 11:19:33  PM        CRITICAL CARE Performed by: Pryor Curia   Total critical care time: 65 minutes  Critical care time was exclusive of separately billable procedures and treating other patients.  Critical care was necessary to treat or prevent imminent or life-threatening deterioration.  Critical care was time spent personally by me on the following activities: development of treatment plan with patient and/or surrogate as well as nursing, discussions with consultants, evaluation of patient's response to treatment, examination of patient, obtaining history from patient or surrogate, ordering and performing treatments and interventions, ordering and review of laboratory studies, ordering and review of radiographic studies, pulse oximetry and re-evaluation of patient's condition.    Baelynn Schmuhl, Delice Bison, DO 10/11/18 [phone]

## 2018-10-10 NOTE — Progress Notes (Deleted)
Subjective:    Patient ID: Megan [name], female    DOB: 09-Mar-1941, 77 y.o.   MRN: [id number]  HPI She is here for an acute visit for cold symptoms.  Her symptoms started   She is experiencing  She has taken  Medications and allergies reviewed with patient and updated if appropriate.  Patient Active Problem List   Diagnosis Date Noted  . Post herpetic neuralgia 03/30/2018  . Herpes zoster without complication 60/07/9322  . Right elbow pain 03/01/2018  . Carpal tunnel syndrome on right 11/30/2017  . Cough 08/22/2017  . Primary osteoarthritis of both hands 03/08/2017  . High risk medication use 03/07/2017  . Poor balance 02/07/2017  . Subarachnoid bleed ([place]) 01/11/2017  . Psoriatic arthropathy ([place]) 09/08/2016  . Hypertension 09/08/2016  . Diabetic neuropathy ([place]) 09/08/2016  . LADA (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), managed as type 1 ([place]) 05/04/2016  . Syncope 03/25/2016  . Malnutrition of moderate degree 02/29/2016  . Diabetic ketoacidosis with coma associated with diabetes mellitus due to underlying condition ([place])   . Abnormal finding on GI tract imaging   . Diarrhea   . Hematochezia   . RLS (restless legs syndrome) 12/11/2015  . Liver dysfunction 08/11/2015  . Lumbago 05/06/2015  . Peripheral edema 04/14/2015  . Psoriasis 04/14/2015  . Encounter for antineoplastic immunotherapy 04/06/2015  . Emphysema of lung ([place]) 11/26/2014  . Radiation-induced esophageal stricture 10/01/2014  . Tachycardia 08/27/2014  . DNR (do not resuscitate) discussion 08/21/2014  . Cancer associated pain 07/29/2014  . Malignant cachexia ([place]) 07/29/2014  . Lung collapse 07/29/2014  . Fatigue 07/18/2014  . Neuropathy due to chemotherapeutic drug ([place]) 07/18/2014  . Leg cramps 07/18/2014  . Hypoalbuminemia 07/18/2014  . Thrombocytopenia, unspecified ([place]) 07/18/2014  . Dyspnea 04/28/2014  . Radiation esophagitis 04/10/2014  . Right-sided chest wall pain 04/01/2014  .  Cancer of middle lobe of lung ([place]) 01/17/2014  . Malignant neoplasm of right upper lobe of lung ([place]) 11/28/2013    Current Outpatient Medications on File Prior to Visit  Medication Sig Dispense Refill  . augmented betamethasone dipropionate (DIPROLENE-AF) 0.05 % ointment APP AA BID PRN 30 g 1  . betamethasone dipropionate (DIPROLENE) 0.05 % cream Apply topically 2 (two) times daily. 45 g 0  . budesonide-formoterol (SYMBICORT) 80-4.5 MCG/ACT inhaler Inhale 2 puffs into the lungs 2 (two) times daily. 1 Inhaler 6  . DULoxetine (CYMBALTA) 60 MG capsule Take 1 capsule (60 mg total) by mouth 2 (two) times daily. 180 capsule 4  . gabapentin (NEURONTIN) 300 MG capsule Take 1 capsule (300 mg total) by mouth 3 (three) times daily. 270 capsule 4  . glucose blood test strip Use as instructed 4x a day - Verio 200 each 5  . Insulin Detemir (LEVEMIR FLEXTOUCH) 100 UNIT/ML Pen Inject 17 units in the morning, and 28 units in the pm 45 mL 0  . insulin lispro (HUMALOG KWIKPEN) 100 UNIT/ML KwikPen INJECT 15 TO 26 UNITS UNDER THE SKIN THREE TIMES DAILY 45 mL 0  . Insulin Pen Needle (B-D UF III MINI PEN NEEDLES) 31G X 5 MM MISC USE TO CHECK SUGAR FIVE TIMES DAILY 100 each 4  . ONETOUCH DELICA LANCETS FINE MISC USE TO CHECK BLOOD SUGAR FOUR TIMES DAILY 300 each 5  . ONETOUCH VERIO test strip USE FIVE TIMES DAILY AS DIRECTED 400 each 1  . ONETOUCH VERIO test strip USE FIVE TIMES DAILY AS DIRECTED 400 each 0   No current facility-administered medications on file  prior to visit.     Past Medical History:  Diagnosis Date  . Cancer ([place]) dx'd 09/2013   Lung ca  . Concussion 2018  . Diabetes ([place]) 09/08/2016   type I diabetic patient reported on 09/13/16   . Diabetes mellitus    insulin  . Diabetic neuropathy ([place]) 09/08/2016  . Fall 2018  . GERD (gastroesophageal reflux disease)    no meds for  . History of colitis   . History of hiatal hernia   . Hx of radiation therapy 12/16/13-01/30/14   lung 66Gy  .  Hypertension   . Malignant neoplasm of right upper lobe of lung ([place]) 11/28/2013  . Neuropathy   . Psoriasis   . Psoriatic arthritis ([place]) 09/08/2016  . Rheumatoid arthritis ([place])   . Shortness of breath dyspnea    with exertion    Past Surgical History:  Procedure Laterality Date  . ABDOMINAL HYSTERECTOMY    . ABDOMINAL SURGERY    . BALLOON DILATION N/A 12/12/2014   Procedure: BALLOON DILATION;  Surgeon: Inda Castle, MD;  Location: [facility];  Service: Endoscopy;  Laterality: N/A;  . CHOLECYSTECTOMY    . COLONOSCOPY N/A 02/07/2016   Procedure: COLONOSCOPY;  Surgeon: Doran Stabler, MD;  Location: [facility]COPY;  Service: Endoscopy;  Laterality: N/A;  . DILATION AND CURETTAGE OF UTERUS    . ESOPHAGOGASTRODUODENOSCOPY N/A 12/12/2014   Procedure: ESOPHAGOGASTRODUODENOSCOPY (EGD);  Surgeon: Inda Castle, MD;  Location: [facility];  Service: Endoscopy;  Laterality: N/A;  with dilation  . ESOPHAGOGASTRODUODENOSCOPY (EGD) WITH PROPOFOL N/A 10/09/2014   Procedure: ESOPHAGOGASTRODUODENOSCOPY (EGD) WITH PROPOFOL;  Surgeon: Inda Castle, MD;  Location: [place];  Service: Endoscopy;  Laterality: N/A;  . ESOPHAGOGASTRODUODENOSCOPY (EGD) WITH PROPOFOL N/A 09/19/2016   Procedure: ESOPHAGOGASTRODUODENOSCOPY (EGD) WITH PROPOFOL;  Surgeon: Doran Stabler, MD;  Location: [facility] ENDOSCOPY;  Service: Gastroenterology;  Laterality: N/A;  with savary dil tt  . FRACTURE SURGERY    . ORIF ANKLE FRACTURE Right 07/04/2015   Procedure: OPEN REDUCTION INTERNAL FIXATION (ORIF) ANKLE FRACTURE;  Surgeon: Newt Minion, MD;  Location: [place];  Service: Orthopedics;  Laterality: Right;  OPEN REDUCTION, INTERNAL FIXATION OF RIGHT ANKLE FRACTURE.   [name] ORIF ANKLE FRACTURE Left 02/10/2016   Procedure: OPEN REDUCTION INTERNAL FIXATION (ORIF) ANKLE FRACTURE;  Surgeon: Newt Minion, MD;  Location: [place];  Service: Orthopedics;  Laterality: Left;  . SAVORY DILATION N/A 10/09/2014   Procedure: SAVORY DILATION;   Surgeon: Inda Castle, MD;  Location: [place];  Service: Endoscopy;  Laterality: N/A;  . SAVORY DILATION N/A 09/19/2016   Procedure: SAVORY DILATION;  Surgeon: Doran Stabler, MD;  Location: [facility] ENDOSCOPY;  Service: Gastroenterology;  Laterality: N/A;  . TONSILLECTOMY      Social History   Socioeconomic History  . Marital status: Divorced    Spouse name: Not on file  . Number of children: 0  . Years of education: MA  . Highest education level: Master's degree (e.g., MA, MS, MEng, MEd, MSW, MBA)  Occupational History  . Occupation: Retired    Fish farm manager: OTHER  Social Needs  . Financial resource strain: Not hard at all  . Food insecurity:    Worry: Never true    Inability: Never true  . Transportation needs:    Medical: No    Non-medical: No  Tobacco Use  . Smoking status: Former Smoker    Packs/day: 3.00    Years: 45.00    Pack years: 135.00  Types: Cigarettes    Last attempt to quit: 12/05/1997    Years since quitting: 20.8  . Smokeless tobacco: Never Used  Substance and Sexual Activity  . Alcohol use: No    Alcohol/week: 0.0 standard drinks    Comment: rarely   . Drug use: No  . Sexual activity: Not Currently  Lifestyle  . Physical activity:    Days per week: 0 days    Minutes per session: 0 min  . Stress: Not at all  Relationships  . Social connections:    Talks on phone: More than three times a week    Gets together: More than three times a week    Attends religious service: More than 4 times per year    Active member of club or organization: Yes    Attends meetings of clubs or organizations: More than 4 times per year    Relationship status: Not on file  Other Topics Concern  . Not on file  Social History Narrative   Patient lives at home alone.   Caffeine Use: 1 cups daily   Right handed    Family History  Problem Relation Age of Onset  . Other Mother   . Other Father        failure to thrive  . Hypertension Other   . Stroke Other   .  Heart attack Other   . Hemachromatosis Other   . Rheum arthritis Unknown        both sets of grandparents and father    Review of Systems     Objective:  There were no vitals filed for this visit. There were no vitals filed for this visit. There is no height or weight on file to calculate BMI.  Wt Readings from Last 3 Encounters:  08/01/18 186 lb 3.2 oz (84.5 kg)  03/30/18 189 lb (85.7 kg)  03/19/18 190 lb (86.2 kg)     Physical Exam GENERAL APPEARANCE: Appears stated age, well appearing, NAD EYES: conjunctiva clear, no icterus HEENT: bilateral tympanic membranes and ear canals normal, oropharynx with mild erythema, no thyromegaly, trachea midline, no cervical or supraclavicular lymphadenopathy LUNGS: Clear to auscultation without wheeze or crackles, unlabored breathing, good air entry bilaterally CARDIOVASCULAR: Normal S1,S2 without murmurs, no edema SKIN: warm, dry        Assessment & Plan:   See Problem List for Assessment and Plan of chronic medical problems.

## 2018-10-11 ENCOUNTER — Ambulatory Visit: Payer: Medicare Other | Admitting: Internal Medicine

## 2018-10-11 ENCOUNTER — Other Ambulatory Visit: Payer: Self-pay

## 2018-10-11 ENCOUNTER — Emergency Department (HOSPITAL_COMMUNITY): Payer: Medicare Other

## 2018-10-11 ENCOUNTER — Encounter (HOSPITAL_COMMUNITY): Payer: Self-pay

## 2018-10-11 ENCOUNTER — Inpatient Hospital Stay (HOSPITAL_COMMUNITY): Payer: Medicare Other

## 2018-10-11 DIAGNOSIS — Z6829 Body mass index (BMI) 29.0-29.9, adult: Secondary | ICD-10-CM | POA: Diagnosis not present

## 2018-10-11 DIAGNOSIS — E139 Other specified diabetes mellitus without complications: Secondary | ICD-10-CM

## 2018-10-11 DIAGNOSIS — R0902 Hypoxemia: Secondary | ICD-10-CM

## 2018-10-11 DIAGNOSIS — J189 Pneumonia, unspecified organism: Secondary | ICD-10-CM | POA: Diagnosis present

## 2018-10-11 DIAGNOSIS — J9819 Other pulmonary collapse: Secondary | ICD-10-CM | POA: Diagnosis present

## 2018-10-11 DIAGNOSIS — C799 Secondary malignant neoplasm of unspecified site: Secondary | ICD-10-CM | POA: Diagnosis present

## 2018-10-11 DIAGNOSIS — E1022 Type 1 diabetes mellitus with diabetic chronic kidney disease: Secondary | ICD-10-CM | POA: Diagnosis present

## 2018-10-11 DIAGNOSIS — J690 Pneumonitis due to inhalation of food and vomit: Secondary | ICD-10-CM

## 2018-10-11 DIAGNOSIS — R739 Hyperglycemia, unspecified: Secondary | ICD-10-CM | POA: Diagnosis present

## 2018-10-11 DIAGNOSIS — R6520 Severe sepsis without septic shock: Secondary | ICD-10-CM | POA: Diagnosis present

## 2018-10-11 DIAGNOSIS — J449 Chronic obstructive pulmonary disease, unspecified: Secondary | ICD-10-CM

## 2018-10-11 DIAGNOSIS — R509 Fever, unspecified: Secondary | ICD-10-CM | POA: Diagnosis not present

## 2018-10-11 DIAGNOSIS — R64 Cachexia: Secondary | ICD-10-CM | POA: Diagnosis present

## 2018-10-11 DIAGNOSIS — J440 Chronic obstructive pulmonary disease with acute lower respiratory infection: Secondary | ICD-10-CM | POA: Diagnosis present

## 2018-10-11 DIAGNOSIS — Z9181 History of falling: Secondary | ICD-10-CM | POA: Diagnosis not present

## 2018-10-11 DIAGNOSIS — Z923 Personal history of irradiation: Secondary | ICD-10-CM | POA: Diagnosis not present

## 2018-10-11 DIAGNOSIS — J9601 Acute respiratory failure with hypoxia: Secondary | ICD-10-CM | POA: Diagnosis present

## 2018-10-11 DIAGNOSIS — J9621 Acute and chronic respiratory failure with hypoxia: Secondary | ICD-10-CM | POA: Diagnosis not present

## 2018-10-11 DIAGNOSIS — M069 Rheumatoid arthritis, unspecified: Secondary | ICD-10-CM | POA: Diagnosis present

## 2018-10-11 DIAGNOSIS — R1310 Dysphagia, unspecified: Secondary | ICD-10-CM | POA: Diagnosis present

## 2018-10-11 DIAGNOSIS — Z87891 Personal history of nicotine dependence: Secondary | ICD-10-CM | POA: Diagnosis not present

## 2018-10-11 DIAGNOSIS — N183 Chronic kidney disease, stage 3 (moderate): Secondary | ICD-10-CM | POA: Diagnosis present

## 2018-10-11 DIAGNOSIS — G9340 Encephalopathy, unspecified: Secondary | ICD-10-CM | POA: Diagnosis present

## 2018-10-11 DIAGNOSIS — Z888 Allergy status to other drugs, medicaments and biological substances status: Secondary | ICD-10-CM | POA: Diagnosis not present

## 2018-10-11 DIAGNOSIS — E1065 Type 1 diabetes mellitus with hyperglycemia: Secondary | ICD-10-CM | POA: Diagnosis present

## 2018-10-11 DIAGNOSIS — E872 Acidosis: Secondary | ICD-10-CM | POA: Diagnosis present

## 2018-10-11 DIAGNOSIS — I129 Hypertensive chronic kidney disease with stage 1 through stage 4 chronic kidney disease, or unspecified chronic kidney disease: Secondary | ICD-10-CM | POA: Diagnosis present

## 2018-10-11 DIAGNOSIS — E1040 Type 1 diabetes mellitus with diabetic neuropathy, unspecified: Secondary | ICD-10-CM | POA: Diagnosis present

## 2018-10-11 DIAGNOSIS — A419 Sepsis, unspecified organism: Secondary | ICD-10-CM | POA: Diagnosis present

## 2018-10-11 DIAGNOSIS — L4050 Arthropathic psoriasis, unspecified: Secondary | ICD-10-CM | POA: Diagnosis present

## 2018-10-11 DIAGNOSIS — E876 Hypokalemia: Secondary | ICD-10-CM | POA: Diagnosis not present

## 2018-10-11 LAB — CBC WITH DIFFERENTIAL/PLATELET
Abs Immature Granulocytes: 0.15 10*3/uL — ABNORMAL HIGH (ref 0.00–0.07)
Basophils Absolute: 0.1 10*3/uL (ref 0.0–0.1)
Basophils Relative: 0 %
EOS ABS: 0 10*3/uL (ref 0.0–0.5)
Eosinophils Relative: 0 %
HCT: 37.3 % (ref 36.0–46.0)
Hemoglobin: 11.2 g/dL — ABNORMAL LOW (ref 12.0–15.0)
Immature Granulocytes: 1 %
Lymphocytes Relative: 7 %
Lymphs Abs: 0.9 10*3/uL (ref 0.7–4.0)
MCH: 27.3 pg (ref 26.0–34.0)
MCHC: 30 g/dL (ref 30.0–36.0)
MCV: 90.8 fL (ref 80.0–100.0)
MONOS PCT: 8 %
Monocytes Absolute: 1 10*3/uL (ref 0.1–1.0)
Neutro Abs: 10.7 10*3/uL — ABNORMAL HIGH (ref 1.7–7.7)
Neutrophils Relative %: 84 %
Platelets: 207 10*3/uL (ref 150–400)
RBC: 4.11 MIL/uL (ref 3.87–5.11)
RDW: 15.9 % — AB (ref 11.5–15.5)
WBC: 12.8 10*3/uL — ABNORMAL HIGH (ref 4.0–10.5)
nRBC: 0 % (ref 0.0–0.2)

## 2018-10-11 LAB — BASIC METABOLIC PANEL
ANION GAP: 10 (ref 5–15)
BUN: 14 mg/dL (ref 8–23)
CALCIUM: 7.5 mg/dL — AB (ref 8.9–10.3)
CHLORIDE: 105 mmol/L (ref 98–111)
CO2: 23 mmol/L (ref 22–32)
Creatinine, Ser: 1.06 mg/dL — ABNORMAL HIGH (ref 0.44–1.00)
GFR calc Af Amer: 59 mL/min — ABNORMAL LOW (ref >60)
GFR calc non Af Amer: 51 mL/min — ABNORMAL LOW (ref >60)
Glucose, Bld: 346 mg/dL — ABNORMAL HIGH (ref 70–99)
Potassium: 3.2 mmol/L — ABNORMAL LOW (ref 3.5–5.1)
SODIUM: 138 mmol/L (ref 135–145)

## 2018-10-11 LAB — LACTIC ACID, PLASMA
Lactic Acid, Venous: 1.1 mmol/L (ref 0.5–1.9)
Lactic Acid, Venous: 1.7 mmol/L (ref 0.5–1.9)

## 2018-10-11 LAB — HEPATIC FUNCTION PANEL
ALT: 18 U/L (ref 0–44)
AST: 16 U/L (ref 15–41)
Albumin: 2.7 g/dL — ABNORMAL LOW (ref 3.5–5.0)
Alkaline Phosphatase: 103 U/L (ref 38–126)
Bilirubin, Direct: 0.1 mg/dL (ref 0.0–0.2)
Indirect Bilirubin: 0.7 mg/dL (ref 0.3–0.9)
Total Bilirubin: 0.8 mg/dL (ref 0.3–1.2)
Total Protein: 5.5 g/dL — ABNORMAL LOW (ref 6.5–8.1)

## 2018-10-11 LAB — INFLUENZA PANEL BY PCR (TYPE A & B)
INFLBPCR: NEGATIVE
Influenza A By PCR: NEGATIVE

## 2018-10-11 LAB — CBG MONITORING, ED
GLUCOSE-CAPILLARY: 254 mg/dL — AB (ref 70–99)
Glucose-Capillary: 193 mg/dL — ABNORMAL HIGH (ref 70–99)
Glucose-Capillary: 233 mg/dL — ABNORMAL HIGH (ref 70–99)
Glucose-Capillary: 242 mg/dL — ABNORMAL HIGH (ref 70–99)
Glucose-Capillary: 256 mg/dL — ABNORMAL HIGH (ref 70–99)
Glucose-Capillary: 273 mg/dL — ABNORMAL HIGH (ref 70–99)
Glucose-Capillary: 353 mg/dL — ABNORMAL HIGH (ref 70–99)
Glucose-Capillary: 468 mg/dL — ABNORMAL HIGH (ref 70–99)
Glucose-Capillary: 509 mg/dL (ref 70–99)

## 2018-10-11 LAB — TROPONIN I: Troponin I: <0.03 ng/mL (ref <0.03)

## 2018-10-11 LAB — CREATININE, SERUM
Creatinine, Ser: 0.88 mg/dL (ref 0.44–1.00)
GFR calc Af Amer: >60 mL/min (ref >60)
GFR calc non Af Amer: >60 mL/min (ref >60)

## 2018-10-11 LAB — MRSA PCR SCREENING: MRSA by PCR: NEGATIVE

## 2018-10-11 LAB — GLUCOSE, CAPILLARY
Glucose-Capillary: 253 mg/dL — ABNORMAL HIGH (ref 70–99)
Glucose-Capillary: 128 mg/dL — ABNORMAL HIGH (ref 70–99)

## 2018-10-11 MED ORDER — SODIUM CHLORIDE 0.9 % IV SOLN: INTRAVENOUS | Status: AC

## 2018-10-11 MED ORDER — SODIUM CHLORIDE 0.9 % IV SOLN
INTRAVENOUS | Status: AC
  Administered 2018-10-11: 04:00:00 via INTRAVENOUS

## 2018-10-11 MED ORDER — ALUM & MAG HYDROXIDE-SIMETH 200-200-20 MG/5ML PO SUSP
30.0000 mL | ORAL | Status: DC | PRN
  Administered 2018-10-11: 30 mL via ORAL
  Filled 2018-10-11: qty 30

## 2018-10-11 MED ORDER — INSULIN DETEMIR 100 UNIT/ML ~~LOC~~ SOLN
10.0000 [IU] | Freq: Two times a day (BID) | SUBCUTANEOUS | Status: DC
  Administered 2018-10-11: 10 [IU] via SUBCUTANEOUS
  Filled 2018-10-11 (×2): qty 0.1

## 2018-10-11 MED ORDER — GABAPENTIN 300 MG PO CAPS
300.0000 mg | ORAL_CAPSULE | Freq: Three times a day (TID) | ORAL | Status: DC
  Administered 2018-10-11 – 2018-10-13 (×7): 300 mg via ORAL
  Filled 2018-10-11 (×7): qty 1

## 2018-10-11 MED ORDER — ACETAMINOPHEN 325 MG PO TABS
650.0000 mg | ORAL_TABLET | Freq: Four times a day (QID) | ORAL | Status: DC | PRN
  Administered 2018-10-11: 650 mg via ORAL
  Filled 2018-10-11 (×2): qty 2

## 2018-10-11 MED ORDER — ACETAMINOPHEN 500 MG PO TABS
1000.0000 mg | ORAL_TABLET | Freq: Once | ORAL | Status: AC
  Administered 2018-10-11: 1000 mg via ORAL
  Filled 2018-10-11: qty 2

## 2018-10-11 MED ORDER — IOPAMIDOL (ISOVUE-370) INJECTION 76%
INTRAVENOUS | Status: AC
  Filled 2018-10-11: qty 100

## 2018-10-11 MED ORDER — DEXTROSE 50 % IV SOLN: 25.0000 mL | INTRAVENOUS | Status: DC | PRN

## 2018-10-11 MED ORDER — IOPAMIDOL (ISOVUE-370) INJECTION 76%
100.0000 mL | Freq: Once | INTRAVENOUS | Status: AC | PRN
  Administered 2018-10-11: 65 mL via INTRAVENOUS

## 2018-10-11 MED ORDER — INSULIN DETEMIR 100 UNIT/ML ~~LOC~~ SOLN
15.0000 [IU] | Freq: Two times a day (BID) | SUBCUTANEOUS | Status: DC
  Administered 2018-10-11 – 2018-10-12 (×2): 15 [IU] via SUBCUTANEOUS
  Filled 2018-10-11 (×2): qty 0.15

## 2018-10-11 MED ORDER — ONDANSETRON HCL 4 MG PO TABS: 4.0000 mg | ORAL_TABLET | Freq: Four times a day (QID) | ORAL | Status: DC | PRN

## 2018-10-11 MED ORDER — IOPAMIDOL (ISOVUE-370) INJECTION 76%
100.0000 mL | Freq: Once | INTRAVENOUS | Status: AC | PRN
  Administered 2018-10-11: 53 mL via INTRAVENOUS

## 2018-10-11 MED ORDER — INSULIN ASPART 100 UNIT/ML ~~LOC~~ SOLN
4.0000 [IU] | Freq: Three times a day (TID) | SUBCUTANEOUS | Status: DC
  Administered 2018-10-11 – 2018-10-13 (×6): 4 [IU] via SUBCUTANEOUS

## 2018-10-11 MED ORDER — MOMETASONE FURO-FORMOTEROL FUM 100-5 MCG/ACT IN AERO
2.0000 | INHALATION_SPRAY | Freq: Two times a day (BID) | RESPIRATORY_TRACT | Status: DC
  Administered 2018-10-11 – 2018-10-13 (×5): 2 via RESPIRATORY_TRACT
  Filled 2018-10-11: qty 8.8

## 2018-10-11 MED ORDER — ONDANSETRON HCL 4 MG/2ML IJ SOLN: 4.0000 mg | Freq: Four times a day (QID) | INTRAMUSCULAR | Status: DC | PRN

## 2018-10-11 MED ORDER — INSULIN ASPART 100 UNIT/ML ~~LOC~~ SOLN
0.0000 [IU] | Freq: Three times a day (TID) | SUBCUTANEOUS | Status: DC
  Administered 2018-10-11: 4 [IU] via SUBCUTANEOUS
  Administered 2018-10-11: 11 [IU] via SUBCUTANEOUS
  Administered 2018-10-11: 7 [IU] via SUBCUTANEOUS
  Administered 2018-10-12 – 2018-10-13 (×4): 4 [IU] via SUBCUTANEOUS
  Administered 2018-10-13: 7 [IU] via SUBCUTANEOUS

## 2018-10-11 MED ORDER — LEVOFLOXACIN IN D5W 500 MG/100ML IV SOLN
500.0000 mg | INTRAVENOUS | Status: DC
  Administered 2018-10-12 – 2018-10-13 (×2): 500 mg via INTRAVENOUS
  Filled 2018-10-11 (×2): qty 100

## 2018-10-11 MED ORDER — ENOXAPARIN SODIUM 40 MG/0.4ML ~~LOC~~ SOLN
40.0000 mg | Freq: Every day | SUBCUTANEOUS | Status: DC
  Administered 2018-10-11 – 2018-10-13 (×3): 40 mg via SUBCUTANEOUS
  Filled 2018-10-11 (×3): qty 0.4

## 2018-10-11 MED ORDER — INSULIN REGULAR BOLUS VIA INFUSION
0.0000 [IU] | Freq: Three times a day (TID) | INTRAVENOUS | Status: DC
  Filled 2018-10-11: qty 10

## 2018-10-11 MED ORDER — SODIUM CHLORIDE 0.9 % IV SOLN
2.0000 g | INTRAVENOUS | Status: DC
  Administered 2018-10-11: 2 g via INTRAVENOUS
  Filled 2018-10-11: qty 2

## 2018-10-11 MED ORDER — VANCOMYCIN HCL IN DEXTROSE 750-5 MG/150ML-% IV SOLN
750.0000 mg | INTRAVENOUS | Status: DC
  Administered 2018-10-11: 750 mg via INTRAVENOUS
  Filled 2018-10-11: qty 150

## 2018-10-11 MED ORDER — ACETAMINOPHEN 650 MG RE SUPP: 650.0000 mg | Freq: Four times a day (QID) | RECTAL | Status: DC | PRN

## 2018-10-11 MED ORDER — INSULIN REGULAR(HUMAN) IN NACL 100-0.9 UT/100ML-% IV SOLN
INTRAVENOUS | Status: DC
  Administered 2018-10-11: 5.9 [IU]/h via INTRAVENOUS
  Filled 2018-10-11: qty 100

## 2018-10-11 NOTE — ED Notes (Signed)
Breakfast tray at bedside 

## 2018-10-11 NOTE — ED Notes (Addendum)
Dr. Hal Hope ( admitting MD ) notified on pt.'s recent CBG result. Advised RN to discontinue insulin drip after giving Levimir insulin .

## 2018-10-11 NOTE — Progress Notes (Signed)
Inpatient Diabetes Program Recommendations  AACE/ADA: New Consensus Statement on Inpatient Glycemic Control (2015)  Target Ranges:  Prepandial:   less than 140 mg/dL      Peak postprandial:   less than 180 mg/dL (1-2 hours)      Critically ill patients:  140 - 180 mg/dL   Lab Results  Component Value Date   GLUCAP 193 (H) 10/11/2018   HGBA1C 8.3 02/20/2018    Review of Glycemic Control Results for Megan [name], Megan [name] (MRN [id number]) as of 10/11/2018 10:15  Ref. Range 10/11/2018 02:50 10/11/2018 04:00 10/11/2018 04:08 10/11/2018 05:28 10/11/2018 07:44  Glucose-Capillary Latest Ref Range: 70 - 99 mg/dL 353 (H) 256 (H) 273 (H) 233 (H) 193 (H)   Diabetes history: LADA- Diabetes Outpatient Diabetes medications:  Levemir 17 units in the AM and 28 units in the PM Humalog 15-26 units tid with meals Current orders for Inpatient glycemic control:  Levemir 10 units bid, Novolog resistant tid with meals Inpatient Diabetes Program Recommendations:   Consider increasing Levemir to 15 units in the AM and 15 units bid.  Also consider adding Novolog 4 units tid with meals for meal coverage. Also please add A1C to current labs.   Thanks,  Adah Perl, RN, BC-ADM Inpatient Diabetes Coordinator Pager [phone] (8a-5p)

## 2018-10-11 NOTE — ED Notes (Signed)
Ordered breakfast tray, heart healthy/carb

## 2018-10-11 NOTE — Progress Notes (Signed)
Pharmacy Antibiotic Note  Megan [name] is a 77 y.o. female admitted on 10/10/2018 with sepsis.  Dr. Nelda Marseille recommended 7d of levaquin. Pt has started on vanc/cefepime/flagyl already. Dr. Hal Hope ok with the plan.   Plan: Dc vanc/cefepime/flagyl Levaquin 500mg  IV q24 x 6 more days  Height: 5\' 6"  (167.6 cm) Weight: 183 lb 7 oz (83.2 kg) IBW/kg (Calculated) : 59.3  Temp (24hrs), Avg:100.8 F (38.2 C), Min:99.9 F (37.7 C), Max:101.6 F (38.7 C)  Recent Labs  Lab 10/10/18 2254 10/10/18 2305 10/11/18 0328 10/11/18 0723 10/11/18 1041  WBC 14.8*  --  12.8*  --   --   CREATININE 1.24*  --  1.06* 0.88  --   LATICACIDVEN  --  1.99*  --  1.1 1.7    Estimated Creatinine Clearance: 59.2 mL/min (by C-G formula based on SCr of 0.88 mg/dL).    Allergies  Allergen Reactions  . Carboplatin Other (See Comments)    Neuropathy  . Remicade [Infliximab] Anaphylaxis  . Hydromorphone Rash    Onnie Boer, PharmD, [place], AAHIVP, CPP Infectious Disease Pharmacist 10/11/2018 10:31 PM

## 2018-10-11 NOTE — H&P (Signed)
History and Physical    Megan [name] OMV:[id number] DOB: 1941/09/28 DOA: 10/10/2018  PCP: Binnie Rail, MD  Patient coming from: Home.  Chief Complaint: Confusion and cough.  HPI: Megan [name] is a 77 y.o. female with history of metastatic lung cancer on observation, COPD, diabetes mellitus was brought to the ER after patient was having confusion and falls.  History provided by the ER physician and later by the patient when patient became more alert awake.  As per the ER patient patient was initially confused and febrile tachycardic.  Patient also has been recently been diagnosed with upper respiratory tract infection after patient recently traveled to [place].  Denies any chest pain nausea vomiting abdominal pain or diarrhea.  ED Course: In the ER patient was febrile tachycardic and on exam patient had cough nonproductive.  Chest x-ray did not show to the acute and UA was pending.  Patient appeared confused at times.  But on my exam patient became more alert awake.  Blood sugar was 550 with anion gap of 17 bicarb of 23.  Started on insulin infusion and fluids.  Was given fluid bolus for possible developing sepsis and empiric antibiotics admitted for further management.  CT angiogram of the chest is pending.  Review of Systems: As per HPI, rest all negative.   Past Medical History:  Diagnosis Date  . Cancer ([place]) dx'd 09/2013   Lung ca  . Concussion 2018  . Diabetes ([place]) 09/08/2016   type I diabetic patient reported on 09/13/16   . Diabetes mellitus    insulin  . Diabetic neuropathy ([place]) 09/08/2016  . Fall 2018  . GERD (gastroesophageal reflux disease)    no meds for  . History of colitis   . History of hiatal hernia   . Hx of radiation therapy 12/16/13-01/30/14   lung 66Gy  . Hypertension   . Malignant neoplasm of right upper lobe of lung ([place]) 11/28/2013  . Neuropathy   . Psoriasis   . Psoriatic arthritis ([place]) 09/08/2016  . Rheumatoid arthritis ([place])     . Shortness of breath dyspnea    with exertion    Past Surgical History:  Procedure Laterality Date  . ABDOMINAL HYSTERECTOMY    . ABDOMINAL SURGERY    . BALLOON DILATION N/A 12/12/2014   Procedure: BALLOON DILATION;  Surgeon: Inda Castle, MD;  Location: [facility];  Service: Endoscopy;  Laterality: N/A;  . CHOLECYSTECTOMY    . COLONOSCOPY N/A 02/07/2016   Procedure: COLONOSCOPY;  Surgeon: Doran Stabler, MD;  Location: [facility] ENDOSCOPY;  Service: Endoscopy;  Laterality: N/A;  . DILATION AND CURETTAGE OF UTERUS    . ESOPHAGOGASTRODUODENOSCOPY N/A 12/12/2014   Procedure: ESOPHAGOGASTRODUODENOSCOPY (EGD);  Surgeon: Inda Castle, MD;  Location: [facility];  Service: Endoscopy;  Laterality: N/A;  with dilation  . ESOPHAGOGASTRODUODENOSCOPY (EGD) WITH PROPOFOL N/A 10/09/2014   Procedure: ESOPHAGOGASTRODUODENOSCOPY (EGD) WITH PROPOFOL;  Surgeon: Inda Castle, MD;  Location: [place];  Service: Endoscopy;  Laterality: N/A;  . ESOPHAGOGASTRODUODENOSCOPY (EGD) WITH PROPOFOL N/A 09/19/2016   Procedure: ESOPHAGOGASTRODUODENOSCOPY (EGD) WITH PROPOFOL;  Surgeon: Doran Stabler, MD;  [facility] END[facility]COPY;  Service: Gastroenterology;  Laterality: N/A;  with savary dil tt  . FRACTURE SURGERY    . ORIF ANKLE FRACTURE Right 07/04/2015   Procedure: OPEN REDUCTION INTERNAL FIXATION (ORIF) ANKLE FRACTURE;  Surgeon: Newt Minion, MD;  Location: [place];  Service: Orthopedics;  Laterality: Right;  OPEN REDUCTION, INTERNAL FIXATION OF RIGHT ANKLE  FRACTURE.   [name] ORIF ANKLE FRACTURE Left 02/10/2016   Procedure: OPEN REDUCTION INTERNAL FIXATION (ORIF) ANKLE FRACTURE;  Surgeon: Newt Minion, MD;  Location: [place];  Service: Orthopedics;  Laterality: Left;  . SAVORY DILATION N/A 10/09/2014   Procedure: SAVORY DILATION;  Surgeon: Inda Castle, MD;  Location: [place];  Service: Endoscopy;  Laterality: N/A;  . SAVORY DILATION N/A 09/19/2016   Procedure: SAVORY DILATION;  Surgeon: Doran Stabler, MD;  [facility] END[facility];  Service: Gastroenterology;  Laterality: N/A;  . TONSILLECTOMY       reports that she quit smoking about 20 years ago. Her smoking use included cigarettes. She has a 135.00 pack-year smoking history. She has never used smokeless tobacco. She reports that she does not drink alcohol or use drugs.  Allergies  Allergen Reactions  . Carboplatin Other (See Comments)    Neuropathy  . Remicade [Infliximab] Anaphylaxis  . Hydromorphone Rash    Family History  Problem Relation Age of Onset  . Other Mother   . Other Father        failure to thrive  . Hypertension Other   . Stroke Other   . Heart attack Other   . Hemachromatosis Other   . Rheum arthritis Unknown        both sets of grandparents and father    Prior to Admission medications   Medication Sig Start Date End Date Taking? Authorizing Provider  augmented betamethasone dipropionate (DIPROLENE-AF) 0.05 % ointment APP AA BID PRN 03/19/18   Binnie Rail, MD  betamethasone dipropionate (DIPROLENE) 0.05 % cream Apply topically 2 (two) times daily. 03/01/18   Binnie Rail, MD  budesonide-formoterol (SYMBICORT) 80-4.5 MCG/ACT inhaler Inhale 2 puffs into the lungs 2 (two) times daily. 10/18/17   Brand Males, MD  DULoxetine (CYMBALTA) 60 MG capsule Take 1 capsule (60 mg total) by mouth 2 (two) times daily. 09/05/18   Melvenia Beam, MD  gabapentin (NEURONTIN) 300 MG capsule Take 1 capsule (300 mg total) by mouth 3 (three) times daily. 09/05/18   Melvenia Beam, MD  glucose blood test strip Use as instructed 4x a day - Verio 08/17/17   Philemon Kingdom, MD  Insulin Detemir (LEVEMIR FLEXTOUCH) 100 UNIT/ML Pen Inject 17 units in the morning, and 28 units in the pm 07/25/18   Philemon Kingdom, MD  insulin lispro (HUMALOG KWIKPEN) 100 UNIT/ML KwikPen INJECT 15 TO 26 UNITS UNDER THE SKIN THREE TIMES DAILY 09/24/18   Philemon Kingdom, MD  Insulin Pen Needle (B-D UF III MINI PEN NEEDLES) 31G X 5 MM MISC  USE TO CHECK SUGAR FIVE TIMES DAILY 09/13/18   Philemon Kingdom, MD  [facility] DELICA LANCETS FINE MISC USE TO CHECK BLOOD SUGAR FOUR TIMES DAILY 02/20/18   Philemon Kingdom, MD  [facility] VERIO test strip USE FIVE TIMES DAILY AS DIRECTED 02/20/18   Philemon Kingdom, MD  [facility] VERIO test strip USE FIVE TIMES DAILY AS DIRECTED 05/11/18   Philemon Kingdom, MD    Physical Exam: Vitals:   10/10/18 2345 10/11/18 0000 10/11/18 0015 10/11/18 0030  BP: 117/67 130/67 (!) 110/53 115/70  Pulse: (!) 110 (!) 109 (!) 104 (!) 103  Resp: 14 12 (!) 21 16  Temp:      TempSrc:      SpO2: 92% 93% 94% 94%  Weight:      Height:          Constitutional: Moderately built and nourished. Vitals:   10/10/18 2345 10/11/18 0000  10/11/18 0015 10/11/18 0030  BP: 117/67 130/67 (!) 110/53 115/70  Pulse: (!) 110 (!) 109 (!) 104 (!) 103  Resp: 14 12 (!) 21 16  Temp:      TempSrc:      SpO2: 92% 93% 94% 94%  Weight:      Height:       Eyes: Anicteric no pallor. ENMT: No discharge from the ears eyes nose or mouth. Neck: No neck rigidity no mass felt. Respiratory: No rhonchi or crepitations. Cardiovascular: S1-S2 heard. Abdomen: Soft nontender bowel sounds present. Musculoskeletal: No edema. Skin: No rash. Neurologic: Alert awake oriented to her name and place moves all extremities. Psychiatric: Appears normal.   Labs on Admission: I have personally reviewed following labs and imaging studies  CBC: Recent Labs  Lab 10/10/18 2254  WBC 14.8*  NEUTROABS 12.8*  HGB 12.6  HCT 42.6  MCV 91.2  PLT 952   Basic Metabolic Panel: Recent Labs  Lab 10/10/18 2254  NA 135  K 3.6  CL 95*  CO2 23  GLUCOSE 550*  BUN 14  CREATININE 1.24*  CALCIUM 8.3*   GFR: Estimated Creatinine Clearance: 42 mL/min (A) (by C-G formula based on SCr of 1.24 mg/dL (H)). Liver Function Tests: Recent Labs  Lab 10/10/18 2254  AST 21  ALT 20  ALKPHOS 130*  BILITOT 1.9*  PROT 6.8  ALBUMIN 3.1*   No results for  input(s): LIPASE, AMYLASE in the last 168 hours. No results for input(s): AMMONIA in the last 168 hours. Coagulation Profile: Recent Labs  Lab 10/10/18 2254  INR 1.17   Cardiac Enzymes: No results for input(s): CKTOTAL, CKMB, CKMBINDEX, TROPONINI in the last 168 hours. BNP (last 3 results) No results for input(s): PROBNP in the last 8760 hours. HbA1C: No results for input(s): HGBA1C in the last 72 hours. CBG: Recent Labs  Lab 10/11/18 0034  GLUCAP 509*   Lipid Profile: No results for input(s): CHOL, HDL, LDLCALC, TRIG, CHOLHDL, LDLDIRECT in the last 72 hours. Thyroid Function Tests: No results for input(s): TSH, T4TOTAL, FREET4, T3FREE, THYROIDAB in the last 72 hours. Anemia Panel: No results for input(s): VITAMINB12, FOLATE, FERRITIN, TIBC, IRON, RETICCTPCT in the last 72 hours. Urine analysis:    Component Value Date/Time   COLORURINE AMBER (A) 03/22/2017 1850   APPEARANCEUR HAZY (A) 03/22/2017 1850   LABSPEC 1.014 03/22/2017 1850   LABSPEC 1.030 08/25/2015 1130   PHURINE 6.0 03/22/2017 1850   GLUCOSEU >=500 (A) 03/22/2017 1850   GLUCOSEU Negative 08/25/2015 1130   HGBUR SMALL (A) 03/22/2017 1850   BILIRUBINUR NEGATIVE 03/22/2017 1850   BILIRUBINUR Negative 08/25/2015 1130   KETONESUR 80 (A) 03/22/2017 1850   PROTEINUR 100 (A) 03/22/2017 1850   UROBILINOGEN 0.2 08/25/2015 1130   NITRITE POSITIVE (A) 03/22/2017 1850   LEUKOCYTESUR MODERATE (A) 03/22/2017 1850   LEUKOCYTESUR Negative 08/25/2015 1130   Sepsis Labs: @LABRCNTIP (procalcitonin:4,lacticidven:4) )No results found for this or any previous visit (from the past 240 hour(s)).   Radiological Exams on Admission: Dg Chest 2 View  Result Date: 10/10/2018 CLINICAL DATA:  Fever EXAM: CHEST - 2 VIEW COMPARISON:  03/22/2017, 02/18/2016 FINDINGS: Cardiomegaly with mild central congestion. Elevation of the right diaphragm. No pleural effusion. No pneumothorax. IMPRESSION: Similar appearance of volume loss in the  right thorax with mild shift of contents to the right. Cardiomegaly with central vascular congestion. Electronically Signed   By: Donavan Foil M.D.   On: 10/10/2018 23:54    EKG: Independently reviewed.  Sinus tachycardia.  Assessment/Plan  Principal Problem:   Acute respiratory failure with hypoxia (HCC) Active Problems:   Malignant neoplasm of right upper lobe of lung (HCC)   Fever   Malignant cachexia (HCC)   LADA (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), managed as type 1 ([place])   Psoriatic arthropathy ([place])   Hypertension    1. Acute respiratory failure with hypoxia with possible developing sepsis and upper respiratory tract symptoms concerning for pneumonia -patient placed on empiric antibiotics.  Follow CT angiogram of the chest.  UA is pending.  Continue hydration. 2. Diabetes mellitus type 2 uncontrolled -patient states he has been compliant with the Levemir and Humalog sliding scale.  Not sure what precipitated uncontrolled diabetes.  Patient was started on glucometer will closely follow CBGs.  And metabolic panel.  Change to Levemir and sliding scale once blood sugars improve and no further anion gap.  If anion gap persist may need to continue with DKA protocol. 3. Acute encephalopathy likely from fever and metabolic reasons.  As improved.  CT head is pending. 4. History of COPD presently not wheezing. 5. Chronic kidney disease stage III creatinine appears to be at baseline. 6. Metastatic lung cancer being observed by oncologist Dr. Julien Nordmann.   DVT prophylaxis: Lovenox. Code Status: DO NOT INTUBATE. Family Communication: No family at the bedside. Disposition Plan: Home. Consults called: None. Admission status: Inpatient.   Rise Patience MD [facility] Pager [phone].  If 7PM-7AM, please contact night-coverage [email or web address] Password [facility]  10/11/2018, 12:55 AM

## 2018-10-11 NOTE — ED Notes (Signed)
Patient was given a cup of ice water. 

## 2018-10-11 NOTE — Consult Note (Addendum)
NAME:  Megan [name], MRN:  [id number], DOB:  Sep 13, 1941, LOS: 0 ADMISSION DATE:  10/10/2018, CONSULTATION DATE:  10/11/18 REFERRING MD: Providence Crosby, CHIEF COMPLAINT:  Cough, confusion  Brief History   Megan [name] is a 77 y.o. female with PMH metastatic lung cancer on observation, COPD, diabetes mellitus who presented to ER 10/10/18 with increasing confusion, non-productive cough x 10 days. Pulmonary was asked to consult by Dr. Providence Crosby for possible post-obstructive PNA.   History of present illness   Megan [name] is a 77 y.o. female with PMH metastatic lung cancer on observation, COPD, diabetes mellitus was who presented to the ER brought to the ER after patient was having confusion and falls.  History provided by the ER physician and later by the patient when patient became more alert awake.  As per the ER patient patient was initially confused and febrile tachycardic.  Patient also has been recently been diagnosed with upper respiratory tract infection after patient recently traveled to [place].  Associated symptoms include fatigue,  constipation, one episode of emesis approximately 7 days ago, persistent nocturnal cough, reflux. Denies chest pain.  Past Medical History   . Cancer ([place]) dx'd 09/2013   Lung ca  . Concussion 2018  . Diabetes ([place]) 09/08/2016   type I diabetic patient reported on 09/13/16   . Diabetes mellitus    insulin  . Diabetic neuropathy ([place]) 09/08/2016  . Fall 2018  . GERD (gastroesophageal reflux disease)    no meds for  . History of colitis   . History of hiatal hernia   . Hx of radiation therapy 12/16/13-01/30/14   lung 66Gy  . Hypertension   . Malignant neoplasm of right upper lobe of lung ([place]) 11/28/2013  . Neuropathy   . Psoriasis   . Psoriatic arthritis ([place]) 09/08/2016  . Rheumatoid arthritis ([name])   . Shortness of breath dyspnea    with exertion     Significant Hospital Events   10/10/18 presented to ED with  confusion and cough x 10 days 12/12 pulm consult for evaluation of cough   Significant Diagnostic Tests:  CXR- reviewed, right sided consolodation CT angiogram chest- reviewed, right lobar consolidation appreciate, no PE appreciated CT head- reviewed, no acute findings appreciated  Micro Data:  10/10/18 Blood cx x2 pending   Antimicrobials:  Vancomycin 12/12-- Cefepime 12/12 --  Flagyl 12/12--  Interim history/subjective:  Upon presentation to ED patient elevated blood glucose treated with insulin gtt and fluids As noted above patient received CT angiogram and CXR for assessment of cough and also has received CT head.   Objective   Blood pressure 126/66, pulse 93, temperature (Abnormal) 100.9 F (38.3 C), temperature source Rectal, resp. rate 20, height 5\' 6"  (1.676 m), weight 83.2 kg, SpO2 98 %.        Intake/Output Summary (Last 24 hours) at 10/11/2018 1028 Last data filed at 10/11/2018 9417 Gross per 24 hour  Intake 3434.01 ml  Output no documentation  Net 3434.01 ml   Filed Weights   10/10/18 2254  Weight: 83.2 kg    Examination: General: WDWN older adult female, ill appearing  HENT: normocephalic, atraumatic, PERRL, dry nasal mucus membranes without discharge Lungs: unlabored, shallow respirations, intermittent cough with minimal green secretions, RML RLL course crackles, no adventitious lung sounds LUL LLL RUL.  Cardiovascular: RRR no r/g/m, 2+ radial pulses Abdomen: Distended, non-tender, hypoactive bowel sounds Extremities: normal ROM BUE BLE Neuro: AAO x4, follows commands   Assessment & Plan:  Megan Agent  VERDIE [name] is a 77 y.o. female with PMH metastatic lung cancer on observation, COPD, diabetes mellitus who presented to ER 10/10/18 with increasing confusion, non-productive cough x 10 days.  Pulm was consulted to evaluate possible PNA.   Right lower lobe PNA? - CAP vs. Aspiration, favor aspiration due to severe chronic GERD.  Plan - Recommend speech  swallow study for evaluation of possible aspiration event - Recommend turn, cough, deep breathing q2hr for airway clearance  - Recommend narrowing of abx coverage; levofloxacin 750mg  PO q24 hours x7 days - Recommend follow up with PCP in 7-10 days  RLL atelectasis/PNA? Post-obstructive process -Possible post-obstructive PNA component, however initial treatment rec is to manage acute PNA process as outlined above and follow up  - Patient not interested in invasive measures to evaluate lung process at this time Plan - Recommend follow up with outpatient oncology   Labs   CBC: Recent Labs  Lab 10/10/18 2254 10/11/18 0328  WBC 14.8* 12.8*  NEUTROABS 12.8* 10.7*  HGB 12.6 11.2*  HCT 42.6 37.3  MCV 91.2 90.8  PLT 225 712    Basic Metabolic Panel: Recent Labs  Lab 10/10/18 2254 10/11/18 0328 10/11/18 0723  NA 135 138  --   K 3.6 3.2*  --   CL 95* 105  --   CO2 23 23  --   GLUCOSE 550* 346*  --   BUN 14 14  --   CREATININE 1.24* 1.06* 0.88  CALCIUM 8.3* 7.5*  --    GFR: Estimated Creatinine Clearance: 59.2 mL/min (by C-G formula based on SCr of 0.88 mg/dL). Recent Labs  Lab 10/10/18 2254 10/10/18 2305 10/11/18 0328 10/11/18 0723  WBC 14.8*  --  12.8*  --   LATICACIDVEN  --  1.99*  --  1.1    Liver Function Tests: Recent Labs  Lab 10/10/18 2254 10/11/18 0328  AST 21 16  ALT 20 18  ALKPHOS 130* 103  BILITOT 1.9* 0.8  PROT 6.8 5.5*  ALBUMIN 3.1* 2.7*   No results for input(s): LIPASE, AMYLASE in the last 168 hours. No results for input(s): AMMONIA in the last 168 hours.  ABG    Component Value Date/Time   PHART 7.496 (H) 02/18/2016 1012   PCO2ART 30.8 (L) 02/18/2016 1012   PO2ART 79.0 (L) 02/18/2016 1012   HCO3 9.7 (L) 02/26/2016 1038   TCO2 11 02/26/2016 1038   ACIDBASEDEF 17.0 (H) 02/26/2016 1038   O2SAT 35.0 02/26/2016 1038     Coagulation Profile: Recent Labs  Lab 10/10/18 2254  INR 1.17    Cardiac Enzymes: Recent Labs  Lab  10/11/18 0723  TROPONINI <0.03    HbA1C: Hemoglobin A1C  Date/Time Value Ref Range Status  02/20/2018 03:35 PM 8.3  Final  11/17/2017 04:51 PM 8.8  Final   Hgb A1c MFr Bld  Date/Time Value Ref Range Status  02/11/2016 10:34 AM 12.8 (H) 4.8 - 5.6 % Final    Comment:    (NOTE)         Pre-diabetes: 5.7 - 6.4         Diabetes: >6.4         Glycemic control for adults with diabetes: <7.0     CBG: Recent Labs  Lab 10/11/18 0250 10/11/18 0400 10/11/18 0408 10/11/18 0528 10/11/18 0744  GLUCAP 353* 256* 273* 233* 193*    Review of Systems:   12 point ROS complete, positive findings as noted in HPI   Past Medical History  She,  has  a past medical history of Cancer ([place]) (dx'd 09/2013), Concussion (2018), Diabetes ([place]) (09/08/2016), Diabetes mellitus, Diabetic neuropathy ([place]) (09/08/2016), Fall (2018), GERD (gastroesophageal reflux disease), History of colitis, History of hiatal hernia, radiation therapy (12/16/13-01/30/14), Hypertension, Malignant neoplasm of right upper lobe of lung ([place]) (11/28/2013), Neuropathy, Psoriasis, Psoriatic arthritis ([place]) (09/08/2016), Rheumatoid arthritis ([place]), and Shortness of breath dyspnea.   Surgical History    Past Surgical History:  Procedure Laterality Date  . ABDOMINAL HYSTERECTOMY    . ABDOMINAL SURGERY    . BALLOON DILATION N/A 12/12/2014   Procedure: BALLOON DILATION;  Surgeon: Inda Castle, MD;  Location: [facility];  Service: Endoscopy;  Laterality: N/A;  . CHOLECYSTECTOMY    . COLONOSCOPY N/A 02/07/2016   Procedure: COLONOSCOPY;  Surgeon: Doran Stabler, MD;  Location: [facility];  Service: Endoscopy;  Laterality: N/A;  . DILATION AND CURETTAGE OF UTERUS    . ESOPHAGOGASTRODUODENOSCOPY N/A 12/12/2014   Procedure: ESOPHAGOGASTRODUODENOSCOPY (EGD);  Surgeon: Inda Castle, MD;  Location[facility];  Service: Endoscopy;  Laterality: N/A;  with dilation  . ESOPHAGOGASTRODUODENOSCOPY (EGD) WITH PROPOFOL N/A 10/09/2014    Procedure: ESOPHAGOGASTRODUODENOSCOPY (EGD) WITH PROPOFOL;  Surgeon: Inda Castle, MD;  Location: [place];  Service: Endoscopy;  Laterality: N/A;  . ESOPHAGOGASTRODUODENOSCOPY (EGD) WITH PROPOFOL N/A 09/19/2016   Procedure: ESOPHAGOGASTRODUODENOSCOPY (EGD) WITH PROPOFOL;  Surgeon: Doran Stabler, MD;  [facility];  Service: Gastroenterology;  Laterality: N/A;  with savary dil tt  . FRACTURE SURGERY    . ORIF ANKLE FRACTURE Right 07/04/2015   Procedure: OPEN REDUCTION INTERNAL FIXATION (ORIF) ANKLE FRACTURE;  Surgeon: Newt Minion, MD;  Location: [place];  Service: Orthopedics;  Laterality: Right;  OPEN REDUCTION, INTERNAL FIXATION OF RIGHT ANKLE FRACTURE.   [name] ORIF ANKLE FRACTURE Left 02/10/2016   Procedure: OPEN REDUCTION INTERNAL FIXATION (ORIF) ANKLE FRACTURE;  Surgeon: Newt Minion, MD;  Location: [place];  Service: Orthopedics;  Laterality: Left;  . SAVORY DILATION N/A 10/09/2014   Procedure: SAVORY DILATION;  Surgeon: Inda Castle, MD;  Location: [place];  Service: Endoscopy;  Laterality: N/A;  . SAVORY DILATION N/A 09/19/2016   Procedure: SAVORY DILATION;  Surgeon: Doran Stabler, MD;  [facility];  Service: Gastroenterology;  Laterality: N/A;  . TONSILLECTOMY       Social History   reports that she quit smoking about 20 years ago. Her smoking use included cigarettes. She has a 135.00 pack-year smoking history. She has never used smokeless tobacco. She reports that she does not drink alcohol or use drugs.   Family History   Her family history includes Heart attack in an other family member; Hemachromatosis in an other family member; Hypertension in an other family member; Other in her father and mother; Rheum arthritis in her unknown relative; Stroke in an other family member.   Allergies Allergies  Allergen Reactions  . Carboplatin Other (See Comments)    Neuropathy  . Remicade [Infliximab] Anaphylaxis  . Hydromorphone Rash     Home  Medications  Prior to Admission medications   Medication Sig Start Date End Date Taking? Authorizing Provider  augmented betamethasone dipropionate (DIPROLENE-AF) 0.05 % ointment APP AA BID PRN 03/19/18   Binnie Rail, MD  betamethasone dipropionate (DIPROLENE) 0.05 % cream Apply topically 2 (two) times daily. 03/01/18   Binnie Rail, MD  budesonide-formoterol (SYMBICORT) 80-4.5 MCG/ACT inhaler Inhale 2 puffs into the lungs 2 (two) times daily. 10/18/17   Brand Males, MD  DULoxetine (CYMBALTA)  60 MG capsule Take 1 capsule (60 mg total) by mouth 2 (two) times daily. 09/05/18   Melvenia Beam, MD  gabapentin (NEURONTIN) 300 MG capsule Take 1 capsule (300 mg total) by mouth 3 (three) times daily. 09/05/18   Melvenia Beam, MD  glucose blood test strip Use as instructed 4x a day - Verio 08/17/17   Philemon Kingdom, MD  Insulin Detemir (LEVEMIR FLEXTOUCH) 100 UNIT/ML Pen Inject 17 units in the morning, and 28 units in the pm 07/25/18   Philemon Kingdom, MD  insulin lispro (HUMALOG KWIKPEN) 100 UNIT/ML KwikPen INJECT 15 TO 26 UNITS UNDER THE SKIN THREE TIMES DAILY 09/24/18   Philemon Kingdom, MD  Insulin Pen Needle (B-D UF III MINI PEN NEEDLES) 31G X 5 MM MISC USE TO CHECK SUGAR FIVE TIMES DAILY 09/13/18   Philemon Kingdom, MD  [facility] DELICA LANCETS FINE MISC USE TO CHECK BLOOD SUGAR FOUR TIMES DAILY 02/20/18   Philemon Kingdom, MD  [facility] VERIO test strip USE FIVE TIMES DAILY AS DIRECTED 02/20/18   Philemon Kingdom, MD  [facility] VERIO test strip USE FIVE TIMES DAILY AS DIRECTED 05/11/18   Philemon Kingdom, MD    Eliseo Gum, AGACNP-BC  Attending Note:  77 year old female with PMH of lung cancer and dilated esophagus who presents with aspiration PNA.  On exam, she is weak but feels better since she was placed on O2 with coarse BS diffusely.  I reviewed chest CT myself, the collapsed component of the right lung is unchanged from prior.  Discussed with PCCM-NP and  TRH-MD.  Pneumonia:  - Cefepime  - Vanc  - F/U on culture  - D/C flagyl  - No need for bronch and patient does not wish for one  Hypoxemia:  - Titrate O2 for sat of 88-92%  - May need an ambulatory desaturation study prior to discharge for home O2 needs  Dysphagia:  - SLP  - Eat with observation for now  COPD:  - Albuterol PRN  PCCM will sign off, please call back if needed.  Patient seen and examined, agree with above note.  I dictated the care and orders written for this patient under my direction.  Rush Farmer, [place]

## 2018-10-11 NOTE — ED Notes (Signed)
Patient currently at CT scan .  

## 2018-10-12 DIAGNOSIS — J9621 Acute and chronic respiratory failure with hypoxia: Secondary | ICD-10-CM

## 2018-10-12 DIAGNOSIS — R509 Fever, unspecified: Secondary | ICD-10-CM

## 2018-10-12 LAB — BASIC METABOLIC PANEL
ANION GAP: 11 (ref 5–15)
BUN: 8 mg/dL (ref 8–23)
CO2: 24 mmol/L (ref 22–32)
Calcium: 7.6 mg/dL — ABNORMAL LOW (ref 8.9–10.3)
Chloride: 101 mmol/L (ref 98–111)
Creatinine, Ser: 1.03 mg/dL — ABNORMAL HIGH (ref 0.44–1.00)
GFR calc Af Amer: >60 mL/min (ref >60)
GFR calc non Af Amer: 53 mL/min — ABNORMAL LOW (ref >60)
Glucose, Bld: 192 mg/dL — ABNORMAL HIGH (ref 70–99)
Potassium: 2.8 mmol/L — ABNORMAL LOW (ref 3.5–5.1)
Sodium: 136 mmol/L (ref 135–145)

## 2018-10-12 LAB — GLUCOSE, CAPILLARY
GLUCOSE-CAPILLARY: 178 mg/dL — AB (ref 70–99)
GLUCOSE-CAPILLARY: 197 mg/dL — AB (ref 70–99)
Glucose-Capillary: 165 mg/dL — ABNORMAL HIGH (ref 70–99)
Glucose-Capillary: 153 mg/dL — ABNORMAL HIGH (ref 70–99)

## 2018-10-12 LAB — MAGNESIUM: Magnesium: 1.7 mg/dL (ref 1.7–2.4)

## 2018-10-12 MED ORDER — DULOXETINE HCL 60 MG PO CPEP
60.0000 mg | ORAL_CAPSULE | Freq: Two times a day (BID) | ORAL | Status: DC
  Administered 2018-10-12 – 2018-10-13 (×3): 60 mg via ORAL
  Filled 2018-10-12 (×3): qty 1

## 2018-10-12 MED ORDER — ORAL CARE MOUTH RINSE
15.0000 mL | Freq: Two times a day (BID) | OROMUCOSAL | Status: DC
  Administered 2018-10-12 – 2018-10-13 (×2): 15 mL via OROMUCOSAL

## 2018-10-12 MED ORDER — INSULIN DETEMIR 100 UNIT/ML ~~LOC~~ SOLN
20.0000 [IU] | Freq: Two times a day (BID) | SUBCUTANEOUS | Status: DC
  Administered 2018-10-12 – 2018-10-13 (×2): 20 [IU] via SUBCUTANEOUS
  Filled 2018-10-12 (×3): qty 0.2

## 2018-10-12 MED ORDER — SODIUM CHLORIDE 0.9 % IV SOLN
INTRAVENOUS | Status: DC | PRN
  Administered 2018-10-12: 500 mL via INTRAVENOUS

## 2018-10-12 MED ORDER — IPRATROPIUM-ALBUTEROL 0.5-2.5 (3) MG/3ML IN SOLN
3.0000 mL | Freq: Four times a day (QID) | RESPIRATORY_TRACT | Status: DC
  Administered 2018-10-12 – 2018-10-13 (×3): 3 mL via RESPIRATORY_TRACT
  Filled 2018-10-12 (×4): qty 3

## 2018-10-12 MED ORDER — ALBUTEROL SULFATE (2.5 MG/3ML) 0.083% IN NEBU: 2.5000 mg | INHALATION_SOLUTION | RESPIRATORY_TRACT | Status: DC | PRN

## 2018-10-12 MED ORDER — POTASSIUM CHLORIDE CRYS ER 20 MEQ PO TBCR
40.0000 meq | EXTENDED_RELEASE_TABLET | ORAL | Status: AC
  Administered 2018-10-12 (×2): 40 meq via ORAL
  Filled 2018-10-12 (×2): qty 2

## 2018-10-12 NOTE — Progress Notes (Signed)
Inpatient Diabetes Program Recommendations  AACE/ADA: New Consensus Statement on Inpatient Glycemic Control (2015)  Target Ranges:  Prepandial:   less than 140 mg/dL      Peak postprandial:   less than 180 mg/dL (1-2 hours)      Critically ill patients:  140 - 180 mg/dL   Lab Results  Component Value Date   GLUCAP 165 (H) 10/12/2018   HGBA1C 8.3 02/20/2018    Review of Glycemic Control Results for Megan [name], Megan [name] (MRN [id number]) as of 10/12/2018 15:28  Ref. Range 10/11/2018 14:16 10/11/2018 17:15 10/11/2018 21:30 10/12/2018 08:41 10/12/2018 11:42  Glucose-Capillary Latest Ref Range: 70 - 99 mg/dL 254 (H) 253 (H) 128 (H) 178 (H) 165 (H)  Diabetes history: LADA- Diabetes Outpatient Diabetes medications:  Levemir 17 units in the AM and 28 units in the PM Humalog 15-26 units tid with meals Current orders for Inpatient glycemic control:  Novolog resistant tid with meals, Novolog 4 units tid with meals, Levemir 20 units bid  Inpatient Diabetes Program Recommendations:    Blood sugars improved today.  Talked briefly with patient regarding Diabetes Management..  She states that's she was taking her medications as ordered by Dr. Cruzita Lederer.  She seemed somewhat frustrated b/c she states that the MD has changed her insulin doses frequently.  Friend states that patient's blood sugars range from 80-425 mg/dL however patient states "no they don't".  Suggested that patient f/u with Dr. Cruzita Lederer.  Patient states that she has no questions about DM.  She says that she tried to get CGM however her insurance would not approve.   Adah Perl, RN, BC-ADM Inpatient Diabetes Coordinator Pager [phone] (8a-5p)

## 2018-10-12 NOTE — Progress Notes (Signed)
   10/12/18 0959  Clinical Encounter Type  Visited With Patient  Visit Type Initial  Referral From Physician  Consult/Referral To Chaplain  Spiritual Encounters  Spiritual Needs Prayer  The chaplain responded to the spiritual care consult for prayer.  The Pt. warmly welcomed the chaplain when the chaplain entered the room.  The Pt. openly affirmed her faith and request for prayer to improve her health and opportunity to return home instead of rehab.  The Pt. recognized her strong relationships with her significant other-George and her dog-Beans.  After sharing prayer, the Pt. invited the chaplain to come back later .  The chaplain accepted the invitation before exiting the spiritual care visit.

## 2018-10-12 NOTE — Progress Notes (Signed)
PROGRESS NOTE    [name]   TIW:[id number]  DOB: [birth year]  DOA: 10/10/2018 PCP: Binnie Rail, MD   Brief Narrative:  Megan [name] is a 77 y.o. female with history of metastatic lung cancer on observation, COPD, diabetes mellitus was brought to the ER after patient was having confusion and falls.  In ED > CBF 550, hypoxic with pulse ox 86% on room air, CT chest noted below, fever 101, WBC count 14  Subjective: Still coughing today. Very week and short of breath when she moves around in the bed. Lives alone at home.   Assessment & Plan:   Principal Problem:   Acute respiratory failure with hypoxia with h/o COPD Severe sepsis with lactic acidosis  - CT  Chest is not much different from prior and thus I requested a pulm eval to help determine if we need to treat for pneumonia or acute bronchitis - pulm recommended to treat for pneumonia - blood cultures neg, MRSA PCR negative - has been transitioned from Vanc/Cefepime to Levaquin per pharmacy on on call physician- will follow at least another 24 hrs.  - still quite symptomatic - will need ambulatory pulse ox today  Active Problems: Acute encephalopathy  - confusion on admission attributed to infection and hypoxia - has resolved  Hypokalemia - replacing- Mg normal    Malignant neoplasm of right upper lobe of lung  - Dr Julien Nordmann has seen her recently- she is under surveillance  DM 2  - sugars uncontrolled on admission likely due to acute infection - increase Levemir to 20 BID  CKD 3 - stable   DVT prophylaxis: Lovenox Code Status: partial code Family Communication:  Disposition Plan: follow for improvement in symptoms, PT eval today Consultants:    Procedures:    Antimicrobials:  Anti-infectives (From admission, onward)   Start     Dose/Rate Route Frequency Ordered Stop   10/12/18 0800  levofloxacin (LEVAQUIN) IVPB 500 mg     500 mg 100 mL/hr over 60 Minutes Intravenous Every 24 hours  10/11/18 2228 10/18/18 0759   10/11/18 2200  ceFEPIme (MAXIPIME) 2 g in sodium chloride 0.9 % 100 mL IVPB  Status:  Discontinued     2 g 200 mL/hr over 30 Minutes Intravenous Every 24 hours 10/11/18 0013 10/11/18 2226   10/11/18 2200  vancomycin (VANCOCIN) IVPB 750 mg/150 ml premix  Status:  Discontinued     750 mg 150 mL/hr over 60 Minutes Intravenous Every 24 hours 10/11/18 0013 10/11/18 2226   10/10/18 2330  ceFEPIme (MAXIPIME) 2 g in sodium chloride 0.9 % 100 mL IVPB     2 g 200 mL/hr over 30 Minutes Intravenous  Once 10/10/18 2318 10/11/18 0042   10/10/18 2330  metroNIDAZOLE (FLAGYL) IVPB 500 mg  Status:  Discontinued     500 mg 100 mL/hr over 60 Minutes Intravenous Every 8 hours 10/10/18 2318 10/11/18 2226   10/10/18 2330  vancomycin (VANCOCIN) IVPB 1000 mg/200 mL premix     1,000 mg 200 mL/hr over 60 Minutes Intravenous  Once 10/10/18 2318 10/11/18 0255       Objective: Vitals:   10/12/18 0345 10/12/18 0600 10/12/18 0736 10/12/18 0744  BP: 128/68  131/79   Pulse: 85  (!) 102 89  Resp: 18  17 18   Temp: 98.7 F (37.1 C) 99.8 F (37.7 C) 98.7 F (37.1 C)   TempSrc: Oral Oral Oral   SpO2: 94%  96% 96%  Weight:      Height:  Intake/Output Summary (Last 24 hours) at 10/12/2018 1134 Last data filed at 10/12/2018 0900 Gross per 24 hour  Intake 970 ml  Output 700 ml  Net 270 ml   Filed Weights   10/10/18 2254  Weight: 83.2 kg    Examination: General exam: Appears comfortable  HEENT: PERRLA, oral mucosa moist, no sclera icterus or thrush Respiratory system: rhonchi, cough, congested Cardiovascular system: S1 & S2 heard, RRR.   Gastrointestinal system: Abdomen soft, non-tender, nondistended. Normal bowel sounds. Central nervous system: Alert and oriented. No focal neurological deficits. Extremities: No cyanosis, clubbing or edema Skin: No rashes or ulcers Psychiatry:  Mood & affect appropriate.     Data Reviewed: I have personally reviewed following labs  and imaging studies  CBC: Recent Labs  Lab 10/10/18 2254 10/11/18 0328  WBC 14.8* 12.8*  NEUTROABS 12.8* 10.7*  HGB 12.6 11.2*  HCT 42.6 37.3  MCV 91.2 90.8  PLT 225 784   Basic Metabolic Panel: Recent Labs  Lab 10/10/18 2254 10/11/18 0328 10/11/18 0723 10/12/18 0831  NA 135 138  --  136  K 3.6 3.2*  --  2.8*  CL 95* 105  --  101  CO2 23 23  --  24  GLUCOSE 550* 346*  --  192*  BUN 14 14  --  8  CREATININE 1.24* 1.06* 0.88 1.03*  CALCIUM 8.3* 7.5*  --  7.6*  MG  --   --   --  1.7   GFR: Estimated Creatinine Clearance: 50.5 mL/min (A) (by C-G formula based on SCr of 1.03 mg/dL (H)). Liver Function Tests: Recent Labs  Lab 10/10/18 2254 10/11/18 0328  AST 21 16  ALT 20 18  ALKPHOS 130* 103  BILITOT 1.9* 0.8  PROT 6.8 5.5*  ALBUMIN 3.1* 2.7*   No results for input(s): LIPASE, AMYLASE in the last 168 hours. No results for input(s): AMMONIA in the last 168 hours. Coagulation Profile: Recent Labs  Lab 10/10/18 2254  INR 1.17   Cardiac Enzymes: Recent Labs  Lab 10/11/18 0723  TROPONINI <0.03   BNP (last 3 results) No results for input(s): PROBNP in the last 8760 hours. HbA1C: No results for input(s): HGBA1C in the last 72 hours. CBG: Recent Labs  Lab 10/11/18 1145 10/11/18 1416 10/11/18 1715 10/11/18 2130 10/12/18 0841  GLUCAP 242* 254* 253* 128* 178*   Lipid Profile: No results for input(s): CHOL, HDL, LDLCALC, TRIG, CHOLHDL, LDLDIRECT in the last 72 hours. Thyroid Function Tests: No results for input(s): TSH, T4TOTAL, FREET4, T3FREE, THYROIDAB in the last 72 hours. Anemia Panel: No results for input(s): VITAMINB12, FOLATE, FERRITIN, TIBC, IRON, RETICCTPCT in the last 72 hours. Urine analysis:    Component Value Date/Time   COLORURINE AMBER (A) 03/22/2017 1850   APPEARANCEUR HAZY (A) 03/22/2017 1850   LABSPEC 1.014 03/22/2017 1850   LABSPEC 1.030 08/25/2015 1130   PHURINE 6.0 03/22/2017 1850   GLUCOSEU >=500 (A) 03/22/2017 1850    GLUCOSEU Negative 08/25/2015 1130   HGBUR SMALL (A) 03/22/2017 1850   BILIRUBINUR NEGATIVE 03/22/2017 1850   BILIRUBINUR Negative 08/25/2015 1130   KETONESUR 80 (A) 03/22/2017 1850   PROTEINUR 100 (A) 03/22/2017 1850   UROBILINOGEN 0.2 08/25/2015 1130   NITRITE POSITIVE (A) 03/22/2017 1850   LEUKOCYTESUR MODERATE (A) 03/22/2017 1850   LEUKOCYTESUR Negative 08/25/2015 1130   Sepsis Labs: @LABRCNTIP (procalcitonin:4,lacticidven:4) ) Recent Results (from the past 240 hour(s))  Culture, blood (Routine x 2)     Status: None (Preliminary result)   Collection Time: 10/10/18  10:54 PM  Result Value Ref Range Status   Specimen Description BLOOD RIGHT ARM  Final   Special Requests   Final    BOTTLES DRAWN AEROBIC AND ANAEROBIC Blood Culture results may not be optimal due to an excessive volume of blood received in culture bottles   Culture   Final    NO GROWTH 2 DAYS Performed at [facility] [address] [place], [place] [address]    Report Status PENDING  Incomplete  Culture, blood (Routine x 2)     Status: None (Preliminary result)   Collection Time: 10/10/18 10:57 PM  Result Value Ref Range Status   Specimen Description BLOOD LEFT HAND  Final   Special Requests   Final    BOTTLES DRAWN AEROBIC AND ANAEROBIC Blood Culture adequate volume   Culture   Final    NO GROWTH 2 DAYS Performed at [facility], [place] [address], [place], [place] [address]    Report Status PENDING  Incomplete  MRSA PCR Screening     Status: None   Collection Time: 10/11/18  4:52 PM  Result Value Ref Range Status   MRSA by PCR NEGATIVE NEGATIVE Final    Comment:        The GeneXpert MRSA Assay (FDA approved for NASAL specimens only), is one component of a comprehensive MRSA colonization surveillance program. It is not intended to diagnose MRSA infection nor to guide or monitor treatment for MRSA infections. Performed at [facility], [place] [address], [place], [place] [address]           Radiology Studies: Dg Chest 2 View  Result Date: 10/10/2018 CLINICAL DATA:  Fever EXAM: CHEST - 2 VIEW COMPARISON:  03/22/2017, 02/18/2016 FINDINGS: Cardiomegaly with mild central congestion. Elevation of the right diaphragm. No pleural effusion. No pneumothorax. IMPRESSION: Similar appearance of volume loss in the right thorax with mild shift of contents to the right. Cardiomegaly with central vascular congestion. Electronically Signed   By: Donavan Foil M.D.   On: 10/10/2018 23:54   Ct Head Wo Contrast  Result Date: 10/11/2018 CLINICAL DATA:  Recent falls and altered level of consciousness, initial encounter EXAM: CT HEAD WITHOUT CONTRAST TECHNIQUE: Contiguous axial images were obtained from the base of the skull through the vertex without intravenous contrast. COMPARISON:  01/11/2017 FINDINGS: Brain: No evidence of acute infarction, hemorrhage, hydrocephalus, extra-axial collection or mass lesion/mass effect. Vascular: No hyperdense vessel or unexpected calcification. Skull: Normal. Negative for fracture or focal lesion. Sinuses/Orbits: No acute finding. Other: Chronic bilateral nasal bone fractures are seen. IMPRESSION: No acute intracranial abnormality noted. Electronically Signed   By: Inez Catalina M.D.   On: 10/11/2018 07:38   Ct Angio Chest Pe W And/or Wo Contrast  Result Date: 10/11/2018 CLINICAL DATA:  Cough, fever, shortness of breath and weakness since yesterday. History of lung cancer. EXAM: CT ANGIOGRAPHY CHEST WITH CONTRAST TECHNIQUE: Multidetector CT imaging of the chest was performed using the standard protocol during bolus administration of intravenous contrast. Multiplanar CT image reconstructions and MIPs were obtained to evaluate the vascular anatomy. CONTRAST:  118 cc Isovue 370 COMPARISON:  Chest radiographs dated 10/10/2018 and chest CT dated 07/24/2018. FINDINGS: Cardiovascular: Normally opacified pulmonary arteries with no pulmonary arterial filling defects seen.  Stable swirling of the vessels in the medial aspect of the right lower lung zone. Small pericardial effusion with a maximum thickness of 12 mm, without significant change. Mediastinum/Nodes: No enlarged lymph nodes. Stable mediastinal shift to the right.  Small hiatal hernia. Lungs/Pleura: Again demonstrated is dense collapsed lung against the mediastinum medially on the right. There is progressive dense collapsed lung in the medial aspect of the right lower lung zone with interval occlusion of the bronchi leading into that area at a location of confluent soft tissue density in the right hilum that has mildly progressed. Progressive perivascular soft tissue thickening extending and in the right suprahilar region. The previously noted 2-4 mm pulmonary nodules in the left lung are not visualized today. Stable left lower lobe calcified granuloma. Upper Abdomen: Diffuse low density of the liver relative to the spleen without significant change. The previously demonstrated 2.0 x 1.4 cm oval area of low density in the anterior aspect of the lateral segment of the left lobe of the liver is not as well visualized, currently measuring approximately 1.5 x 1.2 cm on image number 133 series 7. The previously DISH in all smaller areas of low density in the liver are no longer visualized. Multiple calcified splenic granulomata are again demonstrated. Musculoskeletal: Thoracic spine degenerative changes. Lower cervical spine degenerative changes. Review of the MIP images confirms the above findings. IMPRESSION: 1. Progressive soft tissue density in the right hilum, medial right lower lung zone and right suprahilar region with interval occlusion of the lower lobe bronchi, suspicious for tumor recurrence. Progressive post radiation changes are less likely but possible. 2. No pulmonary emboli seen. 3. Stable diffuse hepatic steatosis. 4. Interval decrease in size and conspicuity of the previously demonstrated probable cyst in the left  lobe of the liver and the other previously demonstrated probable smaller cyst are no longer visible. 5. The previously seen small left lung nodules are no longer visualized. Electronically Signed   By: Claudie Revering M.D.   On: 10/11/2018 02:28      Scheduled Meds: . enoxaparin (LOVENOX) injection  40 mg Subcutaneous Daily  . gabapentin  300 mg Oral TID  . insulin aspart  0-20 Units Subcutaneous TID WC  . insulin aspart  4 Units Subcutaneous TID WC  . insulin detemir  15 Units Subcutaneous BID  . mouth rinse  15 mL Mouth Rinse BID  . mometasone-formoterol  2 puff Inhalation BID  . potassium chloride  40 mEq Oral Q4H   Continuous Infusions: . sodium chloride 500 mL (10/12/18 0857)  . levofloxacin (LEVAQUIN) IV 500 mg (10/12/18 0900)     LOS: 1 day    Time spent in minutes: 35    Debbe Odea, MD [facility] Pager: [email or web address] Password [facility] 10/12/2018, 11:34 AM

## 2018-10-12 NOTE — Progress Notes (Signed)
SATURATION QUALIFICATIONS: (This note is used to comply with regulatory documentation for home oxygen)  Patient Saturations on Room Air at Rest = 90%  Patient Saturations on Room Air while Ambulating = 90%  Patient Saturations on 0 Liters of oxygen while Ambulating = 90%  Please briefly explain why patient needs home oxygen: Patient does NOT need home O2. Patient walked hallway with RN, would desaturate (lowest 87%) but would recover with a couple deep breaths 90% or higher

## 2018-10-13 DIAGNOSIS — A419 Sepsis, unspecified organism: Principal | ICD-10-CM

## 2018-10-13 DIAGNOSIS — R739 Hyperglycemia, unspecified: Secondary | ICD-10-CM

## 2018-10-13 LAB — BASIC METABOLIC PANEL
Anion gap: 10 (ref 5–15)
BUN: 5 mg/dL — AB (ref 8–23)
CO2: 25 mmol/L (ref 22–32)
Calcium: 7.8 mg/dL — ABNORMAL LOW (ref 8.9–10.3)
Chloride: 102 mmol/L (ref 98–111)
Creatinine, Ser: 0.89 mg/dL (ref 0.44–1.00)
GFR calc Af Amer: >60 mL/min (ref >60)
GFR calc non Af Amer: >60 mL/min (ref >60)
GLUCOSE: 208 mg/dL — AB (ref 70–99)
Potassium: 3.3 mmol/L — ABNORMAL LOW (ref 3.5–5.1)
Sodium: 137 mmol/L (ref 135–145)

## 2018-10-13 LAB — GLUCOSE, CAPILLARY
Glucose-Capillary: 190 mg/dL — ABNORMAL HIGH (ref 70–99)
Glucose-Capillary: 239 mg/dL — ABNORMAL HIGH (ref 70–99)

## 2018-10-13 MED ORDER — DM-GUAIFENESIN ER 30-600 MG PO TB12
1.0000 | ORAL_TABLET | Freq: Two times a day (BID) | ORAL | Status: DC
  Administered 2018-10-13: 1 via ORAL
  Filled 2018-10-13: qty 1

## 2018-10-13 MED ORDER — DM-GUAIFENESIN ER 30-600 MG PO TB12: 1.0000 | ORAL_TABLET | Freq: Two times a day (BID) | ORAL | 0 refills | Status: DC | PRN

## 2018-10-13 MED ORDER — POTASSIUM CHLORIDE CRYS ER 20 MEQ PO TBCR
40.0000 meq | EXTENDED_RELEASE_TABLET | ORAL | Status: AC
  Administered 2018-10-13 (×2): 40 meq via ORAL
  Filled 2018-10-13 (×2): qty 2

## 2018-10-13 MED ORDER — ACETAMINOPHEN 325 MG PO TABS: 650.0000 mg | ORAL_TABLET | Freq: Four times a day (QID) | ORAL | Status: DC | PRN

## 2018-10-13 MED ORDER — LEVOFLOXACIN 500 MG PO TABS: 500.0000 mg | ORAL_TABLET | Freq: Every day | ORAL | 0 refills | Status: AC

## 2018-10-13 MED ORDER — ALBUTEROL SULFATE HFA 108 (90 BASE) MCG/ACT IN AERS: 2.0000 | INHALATION_SPRAY | Freq: Four times a day (QID) | RESPIRATORY_TRACT | 2 refills | Status: DC | PRN

## 2018-10-13 NOTE — Evaluation (Signed)
Physical Therapy Evaluation & Discharge Patient Details Name: Megan [name] MRN: [id number] DOB: 1941/05/19 Today's Date: 10/13/2018   History of Present Illness  Pt is a 77 y.o. female admitted 10/11/18 with confusion and falls; found to be hypoxic. Pulmonary workup for PNA. PMH includes metastatic lung CA, COPD, DM.    Clinical Impression  Patient evaluated by Physical Therapy with no further acute PT needs identified. PTA, pt indep and lives with friend available for 24/7 assist. Today, pt ambulatory with RW at supervision-level; Spo2 90% on RA. Educ on energy conservation and fall risk reduction. All education has been completed and the patient has no further questions. Recommend pt follow-up with HHPT services. Acute PT is signing off. Thank you for this referral.    Follow Up Recommendations Home health PT;Supervision for mobility/OOB    Equipment Recommendations  None recommended by PT    Recommendations for Other Services       Precautions / Restrictions Precautions Precautions: Fall Restrictions Weight Bearing Restrictions: No      Mobility  Bed Mobility Overal bed mobility: Independent                Transfers Overall transfer level: Modified independent Equipment used: Rolling walker (2 wheeled)             General transfer comment: Required 1x cues for correct hand placement as pt attempting to pull up on RW  Ambulation/Gait Ambulation/Gait assistance: Supervision Gait Distance (Feet): 150 Feet Assistive device: Rolling walker (2 wheeled) Gait Pattern/deviations: Step-through pattern;Decreased stride length Gait velocity: Decreased Gait velocity interpretation: 1.31 - 2.62 ft/sec, indicative of limited community ambulator General Gait Details: Slow, steady ambulation with RW; supervision for safety, no physical assist required  Stairs            Wheelchair Mobility    Modified Rankin (Stroke Patients Only)       Balance  Overall balance assessment: Needs assistance   Sitting balance-Leahy Scale: Good       Standing balance-Leahy Scale: Fair Standing balance comment: Can static stand and take steps without UE support                             Pertinent Vitals/Pain Pain Assessment: No/denies pain    Home Living Family/patient expects to be discharged to:: Private residence Living Arrangements: Non-relatives/Friends Available Help at Discharge: Friend(s);Available 24 hours/day Type of Home: House Home Access: Stairs to enter Entrance Stairs-Rails: Right Entrance Stairs-Number of Steps: 1 Home Layout: One level Home Equipment: Walker - 4 wheels;Shower seat      Prior Function Level of Independence: Independent               Hand Dominance        Extremity/Trunk Assessment   Upper Extremity Assessment Upper Extremity Assessment: Overall WFL for tasks assessed    Lower Extremity Assessment Lower Extremity Assessment: Overall WFL for tasks assessed       Communication   Communication: No difficulties  Cognition Arousal/Alertness: Awake/alert Behavior During Therapy: WFL for tasks assessed/performed Overall Cognitive Status: Within Functional Limits for tasks assessed                                 General Comments: WFL for simple tasks      General Comments General comments (skin integrity, edema, etc.): SpO2 90% on RA    Exercises  Assessment/Plan    PT Assessment All further PT needs can be met in the next venue of care  PT Problem List Decreased activity tolerance;Decreased balance;Decreased mobility;Cardiopulmonary status limiting activity       PT Treatment Interventions      PT Goals (Current goals can be found in the Care Plan section)  Acute Rehab PT Goals PT Goal Formulation: All assessment and education complete, DC therapy    Frequency     Barriers to discharge        Co-evaluation               AM-PAC PT  "6 Clicks" Mobility  Outcome Measure Help needed turning from your back to your side while in a flat bed without using bedrails?: None Help needed moving from lying on your back to sitting on the side of a flat bed without using bedrails?: None Help needed moving to and from a bed to a chair (including a wheelchair)?: None Help needed standing up from a chair using your arms (e.g., wheelchair or bedside chair)?: None Help needed to walk in hospital room?: A Little Help needed climbing 3-5 steps with a railing? : A Little 6 Click Score: 22    End of Session Equipment Utilized During Treatment: Gait belt Activity Tolerance: Patient tolerated treatment well Patient left: in bed;with call bell/phone within reach Nurse Communication: Mobility status PT Visit Diagnosis: Other abnormalities of gait and mobility (R26.89)    Time: 2707-8675 PT Time Calculation (min) (ACUTE ONLY): 17 min   Charges:   PT Evaluation $PT Eval Moderate Complexity: [place], PT, DPT Acute Rehabilitation Services  Pager [phone] Office [place] 10/13/2018, 10:06 AM

## 2018-10-13 NOTE — Discharge Summary (Addendum)
Physician Discharge Summary  Megan [name] WGN:[id number] DOB: May 05, 1941 DOA: 10/10/2018  PCP: Megan Rail, MD  Admit date: 10/10/2018 Discharge date: 10/13/2018  Admitted From: home Disposition:  home   Recommendations for Outpatient Follow-up:  1. Will need to f/u with PCP by Wednesday to ensure she continues to improve  Home Health:  RN, PT, aid    Discharge Condition:  stable   CODE STATUS:  Full code   Diet recommendation:  Heart healthy Consultations:  none    Discharge Diagnoses:  Principal Problem:   Acute respiratory failure with hypoxia (Megan [name]) Active Problems:   Malignant neoplasm of right upper lobe of lung - in remission     Malignant cachexia (Megan [name])   LADA (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), managed as type 1 (Megan [name])   Psoriatic arthropathy (Megan [name])   Hypertension    Subjective: Coughing has improved. Ambulated in hall yesterday and again today. Feels ready to d/c home to complete treatment.  Brief Summary: Megan [name] is a 77 y.o.femalewithhistory of metastatic lung cancer on observation, COPD, diabetes mellitus was brought to the ER after patient was having confusion and [name].  In ED > CBF 550, hypoxic with pulse ox 86% on room air, , fever 101, WBC count 14 CT chest shows chronic changes- see discussion below with pulm eval  Hospital Course:  Acute respiratory failure with hypoxia with h/o COPD Severe sepsis with lactic acidosis  CAP - CT  Chest is not much different from prior and thus I requested a pulm eval to help determine course of treatment-  - blood cultures neg, MRSA PCR negative - pulm feels whe need to treat this as a pneumonia- they recommended VAnc/Cefepime (I do not feel she is HCAP)- overnight, this was transitioned to Levaquin by on night pharmacist and MD - she is stable on Levaquin after having received 2 doses- I recommend she take 5 more days and f/u with her PCP on day 3 to ensure she continues to improve -  will need ambulatory pulse ox~ 90%- pulse ox at rest 97%- leukocytosis improved  Active Problems: Acute encephalopathy  - confusion on admission attributed to infection and hypoxia - has resolved  Hypokalemia - replacing- Mg normal    Malignant neoplasm of right upper lobe of lung  - Dr Megan [name] has seen her recently- she is in remission and being closely followed for recurrence  DM 2  - sugars uncontrolled on admission likely due to acute infection - increase Levemir to 20 BID  Mild elevated Cr - baseline 0.89 - 1.24 when admitted- has normalized-    Discharge Exam: Vitals:   10/13/18 0748 10/13/18 0755  BP: 129/74   Pulse: 98 99  Resp: 19 20  Temp: 98.4 F (36.9 C)   SpO2: 94% 98%   Vitals:   10/12/18 2314 10/13/18 0739 10/13/18 0748 10/13/18 0755  BP: 140/68 (!) 125/50 129/74   Pulse: 98 91 98 99  Resp: 19 (!) 21 19 20   Temp: 99.7 F (37.6 C)  98.4 F (36.9 C)   TempSrc: Oral  Oral   SpO2: 94% 90% 94% 98%  Weight:      Height:        General: Pt is alert, awake, not in acute distress Cardiovascular: RRR, S1/S2 +, no rubs, no gallops Respiratory: CTA bilaterally, no wheezing, no rhonchi Abdominal: Soft, NT, ND, bowel sounds + Extremities: no edema, no cyanosis   Discharge Instructions  Discharge Instructions    Diet - low  sodium heart healthy   Complete by:  As directed    Increase activity slowly   Complete by:  As directed      Allergies as of 10/13/2018      Reactions   Carboplatin Other (See Comments)   Neuropathy   Remicade [infliximab] Anaphylaxis   Hydromorphone Rash      Medication List    STOP taking these medications   augmented betamethasone dipropionate 0.05 % ointment Commonly known as:  DIPROLENE-AF     TAKE these medications   acetaminophen 325 MG tablet Commonly known as:  TYLENOL Take 2 tablets (650 mg total) by mouth every 6 (six) hours as needed for mild pain (or Fever >/= 101).   albuterol 108 (90 Base) MCG/ACT  inhaler Commonly known as:  PROVENTIL HFA;VENTOLIN HFA Inhale 2 puffs into the lungs every 6 (six) hours as needed for wheezing or shortness of breath.   betamethasone dipropionate 0.05 % cream Commonly known as:  DIPROLENE Apply topically 2 (two) times daily.   budesonide-formoterol 80-4.5 MCG/ACT inhaler Commonly known as:  SYMBICORT Inhale 2 puffs into the lungs 2 (two) times daily. What changed:    when to take this  reasons to take this   dextromethorphan-guaiFENesin 30-600 MG 12hr tablet Commonly known as:  MUCINEX DM Take 1 tablet by mouth 2 (two) times daily as needed for cough.   DULoxetine 60 MG capsule Commonly known as:  CYMBALTA Take 1 capsule (60 mg total) by mouth 2 (two) times daily.   gabapentin 300 MG capsule Commonly known as:  NEURONTIN Take 1 capsule (300 mg total) by mouth 3 (three) times daily.   Insulin Detemir 100 UNIT/ML Pen Commonly known as:  LEVEMIR FLEXTOUCH Inject 17 units in the morning, and 28 units in the pm What changed:    how much to take  how to take this  when to take this  additional instructions   insulin lispro 100 UNIT/ML KwikPen Commonly known as:  HUMALOG KWIKPEN INJECT 15 TO 26 UNITS UNDER THE SKIN THREE TIMES DAILY What changed:    how much to take  how to take this  when to take this   levofloxacin 500 MG tablet Commonly known as:  LEVAQUIN Take 1 tablet (500 mg total) by mouth daily for 5 days.      Follow-up Information    Megan Rail, MD Follow up.   Specialty:  Internal Medicine Why:  by Wednesday. Please see sooner if you are having issues.  Contact information: [place] [id number] [phone]          Allergies  Allergen Reactions  . Carboplatin Other (See Comments)    Neuropathy  . Remicade [Infliximab] Anaphylaxis  . Hydromorphone Rash     Procedures/Studies:    Dg Chest 2 View  Result Date: 10/10/2018 CLINICAL DATA:  Fever EXAM: CHEST - 2 VIEW COMPARISON:   03/22/2017, 02/18/2016 FINDINGS: Cardiomegaly with mild central congestion. Elevation of the right diaphragm. No pleural effusion. No pneumothorax. IMPRESSION: Similar appearance of volume loss in the right thorax with mild shift of contents to the right. Cardiomegaly with central vascular congestion. Electronically Signed   By: Donavan Foil M.D.   On: 10/10/2018 23:54   Ct Head Wo Contrast  Result Date: 10/11/2018 CLINICAL DATA:  Recent [name] and altered level of consciousness, initial encounter EXAM: CT HEAD WITHOUT CONTRAST TECHNIQUE: Contiguous axial images were obtained from the base of the skull through the vertex without intravenous contrast. COMPARISON:  01/11/2017 FINDINGS: Brain: No evidence of acute infarction, hemorrhage, hydrocephalus, extra-axial collection or mass lesion/mass effect. Vascular: No hyperdense vessel or unexpected calcification. Skull: Normal. Negative for fracture or focal lesion. Sinuses/Orbits: No acute finding. Other: Chronic bilateral nasal bone fractures are seen. IMPRESSION: No acute intracranial abnormality noted. Electronically Signed   By: Inez Catalina M.D.   On: 10/11/2018 07:38   Ct Angio Chest Pe W And/or Wo Contrast  Result Date: 10/11/2018 CLINICAL DATA:  Cough, fever, shortness of breath and weakness since yesterday. History of lung cancer. EXAM: CT ANGIOGRAPHY CHEST WITH CONTRAST TECHNIQUE: Multidetector CT imaging of the chest was performed using the standard protocol during bolus administration of intravenous contrast. Multiplanar CT image reconstructions and MIPs were obtained to evaluate the vascular anatomy. CONTRAST:  118 cc Isovue 370 COMPARISON:  Chest radiographs dated 10/10/2018 and chest CT dated 07/24/2018. FINDINGS: Cardiovascular: Normally opacified pulmonary arteries with no pulmonary arterial filling defects seen. Stable swirling of the vessels in the medial aspect of the right lower lung zone. Small pericardial effusion with a maximum  thickness of 12 mm, without significant change. Mediastinum/Nodes: No enlarged lymph nodes. Stable mediastinal shift to the right. Small hiatal hernia. Lungs/Pleura: Again demonstrated is dense collapsed lung against the mediastinum medially on the right. There is progressive dense collapsed lung in the medial aspect of the right lower lung zone with interval occlusion of the bronchi leading into that area at a location of confluent soft tissue density in the right hilum that has mildly progressed. Progressive perivascular soft tissue thickening extending and in the right suprahilar region. The previously noted 2-4 mm pulmonary nodules in the left lung are not visualized today. Stable left lower lobe calcified granuloma. Upper Abdomen: Diffuse low density of the liver relative to the spleen without significant change. The previously demonstrated 2.0 x 1.4 cm oval area of low density in the anterior aspect of the lateral segment of the left lobe of the liver is not as well visualized, currently measuring approximately 1.5 x 1.2 cm on image number 133 series 7. The previously DISH in all smaller areas of low density in the liver are no longer visualized. Multiple calcified splenic granulomata are again demonstrated. Musculoskeletal: Thoracic spine degenerative changes. Lower cervical spine degenerative changes. Review of the MIP images confirms the above findings. IMPRESSION: 1. Progressive soft tissue density in the right hilum, medial right lower lung zone and right suprahilar region with interval occlusion of the lower lobe bronchi, suspicious for tumor recurrence. Progressive post radiation changes are less likely but possible. 2. No pulmonary emboli seen. 3. Stable diffuse hepatic steatosis. 4. Interval decrease in size and conspicuity of the previously demonstrated probable cyst in the left lobe of the liver and the other previously demonstrated probable smaller cyst are no longer visible. 5. The previously seen  small left lung nodules are no longer visualized. Electronically Signed   By: Claudie Revering M.D.   On: 10/11/2018 02:28     The results of significant diagnostics from this hospitalization (including imaging, microbiology, ancillary and laboratory) are listed below for reference.     Microbiology: Recent Results (from the past 240 hour(s))  Culture, blood (Routine x 2)     Status: None (Preliminary result)   Collection Time: 10/10/18 10:54 PM  Result Value Ref Range Status   Specimen Description BLOOD RIGHT ARM  Final   Special Requests   Final    BOTTLES DRAWN AEROBIC AND ANAEROBIC Blood Culture results may not be optimal due to  an excessive volume of blood received in culture bottles   Culture   Final    NO GROWTH 2 DAYS Performed at [facility], [place] [address] [place], [place] [address]    Report Status PENDING  Incomplete  Culture, blood (Routine x 2)     Status: None (Preliminary result)   Collection Time: 10/10/18 10:57 PM  Result Value Ref Range Status   Specimen Description BLOOD LEFT HAND  Final   Special Requests   Final    BOTTLES DRAWN AEROBIC AND ANAEROBIC Blood Culture adequate volume   Culture   Final    NO GROWTH 2 DAYS Performed at [facility], [place] [address] [place], [place] [address]    Report Status PENDING  Incomplete  MRSA PCR Screening     Status: None   Collection Time: 10/11/18  4:52 PM  Result Value Ref Range Status   MRSA by PCR NEGATIVE NEGATIVE Final    Comment:        The GeneXpert MRSA Assay (FDA approved for NASAL specimens only), is one component of a comprehensive MRSA colonization surveillance program. It is not intended to diagnose MRSA infection nor to guide or monitor treatment for MRSA infections. Performed at [facility], [place] [address], [place], [place] [address]      Labs: BNP (last 3 results) No results for input(s): BNP in the last 8760 hours. Basic Metabolic Panel: Recent Labs  Lab 10/10/18 2254  10/11/18 0328 10/11/18 0723 10/12/18 0831 10/13/18 0220  NA 135 138  --  136 137  K 3.6 3.2*  --  2.8* 3.3*  CL 95* 105  --  101 102  CO2 23 23  --  24 25  GLUCOSE 550* 346*  --  192* 208*  BUN 14 14  --  8 5*  CREATININE 1.24* 1.06* 0.88 1.03* 0.89  CALCIUM 8.3* 7.5*  --  7.6* 7.8*  MG  --   --   --  1.7  --    Liver Function Tests: Recent Labs  Lab 10/10/18 2254 10/11/18 0328  AST 21 16  ALT 20 18  ALKPHOS 130* 103  BILITOT 1.9* 0.8  PROT 6.8 5.5*  ALBUMIN 3.1* 2.7*   No results for input(s): LIPASE, AMYLASE in the last 168 hours. No results for input(s): AMMONIA in the last 168 hours. CBC: Recent Labs  Lab 10/10/18 2254 10/11/18 0328  WBC 14.8* 12.8*  NEUTROABS 12.8* 10.7*  HGB 12.6 11.2*  HCT 42.6 37.3  MCV 91.2 90.8  PLT 225 207   Cardiac Enzymes: Recent Labs  Lab 10/11/18 0723  TROPONINI <0.03   BNP: Invalid input(s): POCBNP CBG: Recent Labs  Lab 10/12/18 0841 10/12/18 1142 10/12/18 1730 10/12/18 2152 10/13/18 0749  GLUCAP 178* 165* 153* 197* 190*   D-Dimer No results for input(s): DDIMER in the last 72 hours. Hgb A1c No results for input(s): HGBA1C in the last 72 hours. Lipid Profile No results for input(s): CHOL, HDL, LDLCALC, TRIG, CHOLHDL, LDLDIRECT in the last 72 hours. Thyroid function studies No results for input(s): TSH, T4TOTAL, T3FREE, THYROIDAB in the last 72 hours.  Invalid input(s): FREET3 Anemia work up No results for input(s): VITAMINB12, FOLATE, FERRITIN, TIBC, IRON, RETICCTPCT in the last 72 hours. Urinalysis    Component Value Date/Time   COLORURINE AMBER (A) 03/22/2017 1850   APPEARANCEUR HAZY (A) 03/22/2017 1850   LABSPEC 1.014 03/22/2017 1850   LABSPEC 1.030 08/25/2015 1130   PHURINE 6.0 03/22/2017  [place] >=500 (A) 03/22/2017 1850   GLUCOSEU Negative 08/25/2015 1130   HGBUR SMALL (A) 03/22/2017 1850   BILIRUBINUR NEGATIVE 03/22/2017 1850   BILIRUBINUR Negative 08/25/2015 1130   KETONESUR 80 (A)  03/22/2017 1850   PROTEINUR 100 (A) 03/22/2017 1850   UROBILINOGEN 0.2 08/25/2015 1130   NITRITE POSITIVE (A) 03/22/2017 1850   LEUKOCYTESUR MODERATE (A) 03/22/2017 1850   LEUKOCYTESUR Negative 08/25/2015 1130   Sepsis Labs Invalid input(s): PROCALCITONIN,  WBC,  LACTICIDVEN Microbiology Recent Results (from the past 240 hour(s))  Culture, blood (Routine x 2)     Status: None (Preliminary result)   Collection Time: 10/10/18 10:54 PM  Result Value Ref Range Status   Specimen Description BLOOD RIGHT ARM  Final   Special Requests   Final    BOTTLES DRAWN AEROBIC AND ANAEROBIC Blood Culture results may not be optimal due to an excessive volume of blood received in culture bottles   Culture   Final    NO GROWTH 2 DAYS Performed at [facility], [place] [address], [place], [place] [address]    Report Status PENDING  Incomplete  Culture, blood (Routine x 2)     Status: None (Preliminary result)   Collection Time: 10/10/18 10:57 PM  Result Value Ref Range Status   Specimen Description BLOOD LEFT HAND  Final   Special Requests   Final    BOTTLES DRAWN AEROBIC AND ANAEROBIC Blood Culture adequate volume   Culture   Final    NO GROWTH 2 DAYS Performed at [facility], [place] [address] [place], [place] [address]    Report Status PENDING  Incomplete  MRSA PCR Screening     Status: None   Collection Time: 10/11/18  4:52 PM  Result Value Ref Range Status   MRSA by PCR NEGATIVE NEGATIVE Final    Comment:        The GeneXpert MRSA Assay (FDA approved for NASAL specimens only), is one component of a comprehensive MRSA colonization surveillance program. It is not intended to diagnose MRSA infection nor to guide or monitor treatment for MRSA infections. Performed at [facility], [place] [address] [place],  [address]      Time coordinating discharge in minutes: 65  SIGNED:   Debbe Odea, MD  [facility] 10/13/2018, 11:42 AM Pager   If 7PM-7AM,  please contact night-coverage [email or web address] Password [email or web address]

## 2018-10-13 NOTE — Discharge Instructions (Signed)

## 2018-10-13 NOTE — Care Management Note (Signed)
Case Management Note  Patient Details  Name: Megan [name] MRN: [id number] Date of Birth: 22-Aug-1941  Subjective/Objective:                    Action/Plan:  Spoke w patient at bedside to discuss home plan, she would like to use [facility] for [facility], she states she is familiar with them from past use. Referral made to [facility]. She declines DME needs. Stated she lives at home w friend Megan [name] denies barriers to medication or physician access. No further CM needs.  Expected Discharge Date:  10/13/18               Expected Discharge Plan:  [place]  In-House Referral:     Discharge planning Services  CM Consult  Post Acute Care Choice:  Home Health Choice offered to:  Patient  DME Arranged:    DME Agency:     HH Arranged:  RN, PT, Nurse's Aide, Respirator Therapy Calcium Agency:  [facility]  Status of Service:  Completed, signed off  If discussed at [place] of Stay Meetings, dates discussed:    Additional Comments:  Carles Collet, RN 10/13/2018, 12:04 PM

## 2018-10-15 ENCOUNTER — Telehealth: Payer: Self-pay | Admitting: *Deleted

## 2018-10-15 DIAGNOSIS — J189 Pneumonia, unspecified organism: Secondary | ICD-10-CM | POA: Insufficient documentation

## 2018-10-15 LAB — CULTURE, BLOOD (ROUTINE X 2)
Culture: NO GROWTH
Culture: NO GROWTH
Special Requests: ADEQUATE

## 2018-10-15 NOTE — Telephone Encounter (Signed)
Called pt to confirm appt that has been made for tomorrow for hosp f/u. Pt states she had just called and madr appt for tomorrow. Complete TCM below.../lmb  Transition Care Management Follow-up Telephone Call   Date discharged? 10/13/18   How have you been since you were released from the hospital? Pt states she is feeling ok this morning   Do you understand why you were in the hospital? YES   Do you understand the discharge instructions? YES   Where were you discharged to? Home   Items Reviewed:  Medications reviewed: YES  Allergies reviewed: YES  Dietary changes reviewed: YES  Referrals reviewed: No referral needed   Functional Questionnaire:   Activities of Daily Living (ADLs):   She states she are independent in the following: bathing and hygiene, feeding, continence, grooming, toileting and dressing States they require assistance with the following: ambulation   Any transportation issues/concerns?: NO   Any patient concerns? NO   Confirmed importance and date/time of follow-up visits scheduled YES, appt 10/16/18  Provider Appointment booked with Dr. Quay Burow  Confirmed with patient if condition begins to worsen call PCP or go to the ER.  Patient was given the office number and encouraged to call back with question or concerns.  : YES

## 2018-10-15 NOTE — Progress Notes (Signed)
Subjective:    Patient ID: Megan [name], female    DOB: 04/02/1941, 77 y.o.   MRN: [id number]  HPI The patient is here for follow up from the hospital.     Patient Active Problem List   Diagnosis Date Noted  . Acute respiratory failure with hypoxia ([place]) 10/11/2018  . Post herpetic neuralgia 03/30/2018  . Herpes zoster without complication 09/62/8366  . Right elbow pain 03/01/2018  . Carpal tunnel syndrome on right 11/30/2017  . Cough 08/22/2017  . Primary osteoarthritis of both hands 03/08/2017  . High risk medication use 03/07/2017  . Poor balance 02/07/2017  . Subarachnoid bleed ([place]) 01/11/2017  . Psoriatic arthropathy ([place]) 09/08/2016  . Hypertension 09/08/2016  . Diabetic neuropathy ([place]) 09/08/2016  . 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 Syncope 03/25/2016  . Malnutrition of moderate degree 02/29/2016  . Diabetic ketoacidosis with coma associated with diabetes mellitus due to underlying condition ([place])   . Abnormal finding on GI tract imaging   . Diarrhea   . Hematochezia   . RLS (restless legs syndrome) 12/11/2015  . Liver dysfunction 08/11/2015  . Lumbago 05/06/2015  . Peripheral edema 04/14/2015  . Psoriasis 04/14/2015  . Encounter for antineoplastic immunotherapy 04/06/2015  . Emphysema of lung ([place]) 11/26/2014  . Radiation-induced esophageal stricture 10/01/2014  . Tachycardia 08/27/2014  . DNR (do not resuscitate) discussion 08/21/2014  . Cancer associated pain 07/29/2014  . Malignant cachexia ([place]) 07/29/2014  . Lung collapse 07/29/2014  . Fatigue 07/18/2014  . Neuropathy due to chemotherapeutic drug ([place]) 07/18/2014  . Fever 07/18/2014  . Leg cramps 07/18/2014  . Hypoalbuminemia 07/18/2014  . Thrombocytopenia, unspecified ([place]) 07/18/2014  . Dyspnea 04/28/2014  . Radiation esophagitis 04/10/2014  . Right-sided chest wall pain 04/01/2014  . Cancer of middle lobe of lung ([place]) 01/17/2014  .  Malignant neoplasm of right upper lobe of lung ([place]) 11/28/2013    Current Outpatient Medications on File Prior to Visit  Medication Sig Dispense Refill  . acetaminophen (TYLENOL) 325 MG tablet Take 2 tablets (650 mg total) by mouth every 6 (six) hours as needed for mild pain (or Fever >/= 101).    [name] albuterol (PROVENTIL HFA;VENTOLIN HFA) 108 (90 Base) MCG/ACT inhaler Inhale 2 puffs into the lungs every 6 (six) hours as needed for wheezing or shortness of breath. 1 Inhaler 2  . betamethasone dipropionate (DIPROLENE) 0.05 % cream Apply topically 2 (two) times daily. 45 g 0  . budesonide-formoterol (SYMBICORT) 80-4.5 MCG/ACT inhaler Inhale 2 puffs into the lungs 2 (two) times daily. (Patient taking differently: Inhale 2 puffs into the lungs daily as needed (Wheezing). ) 1 Inhaler 6  . dextromethorphan-guaiFENesin (MUCINEX DM) 30-600 MG 12hr tablet Take 1 tablet by mouth 2 (two) times daily as needed for cough. 30 tablet 0  . DULoxetine (CYMBALTA) 60 MG capsule Take 1 capsule (60 mg total) by mouth 2 (two) times daily. 180 capsule 4  . gabapentin (NEURONTIN) 300 MG capsule Take 1 capsule (300 mg total) by mouth 3 (three) times daily. 270 capsule 4  . Insulin Detemir (LEVEMIR FLEXTOUCH) 100 UNIT/ML Pen Inject 17 units in the morning, and 28 units in the pm (Patient taking differently: Inject 17-27 Units into the skin See admin instructions. Inject 17 units in the morning, and 27 units in the pm on Monday to Friday.) 45 mL 0  . insulin lispro (HUMALOG KWIKPEN) 100 UNIT/ML KwikPen INJECT 15 TO 26 UNITS UNDER THE SKIN  THREE TIMES DAILY (Patient taking differently: Inject 15-26 Units into the skin See admin instructions. INJECT 15 TO 26 UNITS UNDER THE SKIN THREE TIMES DAILY) 45 mL 0  . levofloxacin (LEVAQUIN) 500 MG tablet Take 1 tablet (500 mg total) by mouth daily for 5 days. 10 tablet 0   No current facility-administered medications on file prior to visit.     Past Medical History:  Diagnosis Date    . Cancer ([place]) dx'd 09/2013   Lung ca  . Concussion 2018  . Diabetes ([place]) 09/08/2016   type I diabetic patient reported on 09/13/16   . Diabetes mellitus    insulin  . Diabetic neuropathy ([place]) 09/08/2016  . Fall 2018  . GERD (gastroesophageal reflux disease)    no meds for  . History of colitis   . History of hiatal hernia   . Hx of radiation therapy 12/16/13-01/30/14   lung 66Gy  . Hypertension   . Malignant neoplasm of right upper lobe of lung ([place]) 11/28/2013  . Neuropathy   . Psoriasis   . Psoriatic arthritis ([place]) 09/08/2016  . Rheumatoid arthritis ([place])   . Shortness of breath dyspnea    with exertion    Past Surgical History:  Procedure Laterality Date  . ABDOMINAL HYSTERECTOMY    . ABDOMINAL SURGERY    . BALLOON DILATION N/A 12/12/2014   Procedure: BALLOON DILATION;  Surgeon: Inda Castle, MD;  Location: [facility];  Service: Endoscopy;  Laterality: N/A;  . CHOLECYSTECTOMY    . COLONOSCOPY N/A 02/07/2016   Procedure: COLONOSCOPY;  Surgeon: Doran Stabler, MD;  Location: [facility] ENDOSCOPY;  Service: Endoscopy;  Laterality: N/A;  . DILATION AND CURETTAGE OF UTERUS    . ESOPHAGOGASTRODUODENOSCOPY N/A 12/12/2014   Procedure: ESOPHAGOGASTRODUODENOSCOPY (EGD);  Surgeon: Inda Castle, MD;  Location: [facility];  Service: Endoscopy;  Laterality: N/A;  with dilation  . ESOPHAGOGASTRODUODENOSCOPY (EGD) WITH PROPOFOL N/A 10/09/2014   Procedure: ESOPHAGOGASTRODUODENOSCOPY (EGD) WITH PROPOFOL;  Surgeon: Inda Castle, MD;  Location: [facility];  Service: Endoscopy;  Laterality: N/A;  . ESOPHAGOGASTRODUODENOSCOPY (EGD) WITH PROPOFOL N/A 09/19/2016   Procedure: ESOPHAGOGASTRODUODENOSCOPY (EGD) WITH PROPOFOL;  Surgeon: Doran Stabler, MD;  Location: [facility] ENDOSCOPY;  Service: Gastroenterology;  Laterality: N/A;  with savary dil tt  . FRACTURE SURGERY    . ORIF ANKLE FRACTURE Right 07/04/2015   Procedure: OPEN REDUCTION INTERNAL FIXATION (ORIF) ANKLE FRACTURE;  Surgeon: Newt Minion, MD;  Location: [facility];  Service: Orthopedics;  Laterality: Right;  OPEN REDUCTION, INTERNAL FIXATION OF RIGHT ANKLE FRACTURE.   [name] ORIF ANKLE FRACTURE Left 02/10/2016   Procedure: OPEN REDUCTION INTERNAL FIXATION (ORIF) ANKLE FRACTURE;  Surgeon: Newt Minion, MD;  Location: [facility];  Service: Orthopedics;  Laterality: Left;  . SAVORY DILATION N/A 10/09/2014   Procedure: SAVORY DILATION;  Surgeon: Inda Castle, MD;  Location: [facility];  Service: Endoscopy;  Laterality: N/A;  . SAVORY DILATION N/A 09/19/2016   Procedure: SAVORY DILATION;  Surgeon: Doran Stabler, MD;  Location: [facility] ENDOSCOPY;  Service: Gastroenterology;  Laterality: N/A;  . TONSILLECTOMY      Social History   Socioeconomic History  . Marital status: Divorced    Spouse name: Not on file  . Number of children: 0  . Years of education: MA  . Highest education level: Master's degree (e.g., MA, MS, MEng, MEd, MSW, MBA)  Occupational History  . Occupation: Retired    Fish farm manager: OTHER  Social Needs  . Emergency planning/management officer  strain: Not hard at all  . Food insecurity:    Worry: Never true    Inability: Never true  . Transportation needs:    Medical: No    Non-medical: No  Tobacco Use  . Smoking status: Former Smoker    Packs/day: 3.00    Years: 45.00    Pack years: 135.00    Types: Cigarettes    Last attempt to quit: 12/05/1997    Years since quitting: 20.8  . Smokeless tobacco: Never Used  Substance and Sexual Activity  . Alcohol use: No    Alcohol/week: 0.0 standard drinks    Comment: rarely   . Drug use: No  . Sexual activity: Not Currently  Lifestyle  . Physical activity:    Days per week: 0 days    Minutes per session: 0 min  . Stress: Not at all  Relationships  . Social connections:    Talks on phone: More than three times a week    Gets together: More than three times a week    Attends religious service: More than 4 times per year    Active member of club or organization: Yes    Attends  meetings of clubs or organizations: More than 4 times per year    Relationship status: Not on file  Other Topics Concern  . Not on file  Social History Narrative   Patient lives at home alone.   Caffeine Use: 1 cups daily   Right handed    Family History  Problem Relation Age of Onset  . Other Mother   . Other Father        failure to thrive  . Hypertension Other   . Stroke Other   . Heart attack Other   . Hemachromatosis Other   . Rheum arthritis Unknown        both sets of grandparents and father    Review of Systems     Objective:  There were no vitals filed for this visit. BP Readings from Last 3 Encounters:  10/13/18 129/74  08/01/18 131/65  03/30/18 116/80   Wt Readings from Last 3 Encounters:  10/10/18 183 lb 7 oz (83.2 kg)  08/01/18 186 lb 3.2 oz (84.5 kg)  03/30/18 189 lb (85.7 kg)   There is no height or weight on file to calculate BMI.   Physical Exam       CT HEAD WO CONTRAST CLINICAL DATA:  Recent falls and altered level of consciousness, initial encounter  EXAM: CT HEAD WITHOUT CONTRAST  TECHNIQUE: Contiguous axial images were obtained from the base of the skull through the vertex without intravenous contrast.  COMPARISON:  01/11/2017  FINDINGS: Brain: No evidence of acute infarction, hemorrhage, hydrocephalus, extra-axial collection or mass lesion/mass effect.  Vascular: No hyperdense vessel or unexpected calcification.  Skull: Normal. Negative for fracture or focal lesion.  Sinuses/Orbits: No acute finding.  Other: Chronic bilateral nasal bone fractures are seen.  IMPRESSION: No acute intracranial abnormality noted.  Electronically Signed   By: Inez Catalina M.D.   On: 10/11/2018 07:38 CT Angio Chest PE W and/or Wo Contrast CLINICAL DATA:  Cough, fever, shortness of breath and weakness since yesterday. History of lung cancer.  EXAM: CT ANGIOGRAPHY CHEST WITH CONTRAST  TECHNIQUE: Multidetector CT imaging of the chest was  performed using the standard protocol during bolus administration of intravenous contrast. Multiplanar CT image reconstructions and MIPs were obtained to evaluate the vascular anatomy.  CONTRAST:  118 cc Isovue 370  COMPARISON:  Chest radiographs dated 10/10/2018 and chest CT dated 07/24/2018.  FINDINGS: Cardiovascular: Normally opacified pulmonary arteries with no pulmonary arterial filling defects seen. Stable swirling of the vessels in the medial aspect of the right lower lung zone. Small pericardial effusion with a maximum thickness of 12 mm, without significant change.  Mediastinum/Nodes: No enlarged lymph nodes. Stable mediastinal shift to the right. Small hiatal hernia.  Lungs/Pleura: Again demonstrated is dense collapsed lung against the mediastinum medially on the right. There is progressive dense collapsed lung in the medial aspect of the right lower lung zone with interval occlusion of the bronchi leading into that area at a location of confluent soft tissue density in the right hilum that has mildly progressed. Progressive perivascular soft tissue thickening extending and in the right suprahilar region. The previously noted 2-4 mm pulmonary nodules in the left lung are not visualized today. Stable left lower lobe calcified granuloma.  Upper Abdomen: Diffuse low density of the liver relative to the spleen without significant change. The previously demonstrated 2.0 x 1.4 cm oval area of low density in the anterior aspect of the lateral segment of the left lobe of the liver is not as well visualized, currently measuring approximately 1.5 x 1.2 cm on image number 133 series 7. The previously DISH in all smaller areas of low density in the liver are no longer visualized. Multiple calcified splenic granulomata are again demonstrated.  Musculoskeletal: Thoracic spine degenerative changes. Lower cervical spine degenerative changes.  Review of the MIP images confirms the  above findings.  IMPRESSION: 1. Progressive soft tissue density in the right hilum, medial right lower lung zone and right suprahilar region with interval occlusion of the lower lobe bronchi, suspicious for tumor recurrence. Progressive post radiation changes are less likely but possible. 2. No pulmonary emboli seen. 3. Stable diffuse hepatic steatosis. 4. Interval decrease in size and conspicuity of the previously demonstrated probable cyst in the left lobe of the liver and the other previously demonstrated probable smaller cyst are no longer visible. 5. The previously seen small left lung nodules are no longer visualized.  Electronically Signed   By: Claudie Revering M.D.   On: 10/11/2018 02:28    Lab Results  Component Value Date   WBC 12.8 (H) 10/11/2018   HGB 11.2 (L) 10/11/2018   HCT 37.3 10/11/2018   PLT 207 10/11/2018   GLUCOSE 208 (H) 10/13/2018   CHOL 201 (H) 11/14/2016   TRIG 79.0 11/14/2016   HDL 57.40 11/14/2016   LDLCALC 128 (H) 11/14/2016   ALT 18 10/11/2018   AST 16 10/11/2018   NA 137 10/13/2018   K 3.3 (L) 10/13/2018   CL 102 10/13/2018   CREATININE 0.89 10/13/2018   BUN 5 (L) 10/13/2018   CO2 25 10/13/2018   TSH 8.675 (H) 02/28/2016   INR 1.17 10/10/2018   HGBA1C 8.3 02/20/2018    Assessment & Plan:    See Problem List for Assessment and Plan of chronic medical problems.   This encounter was created in error - please disregard.

## 2018-10-16 ENCOUNTER — Encounter: Payer: Medicare Other | Admitting: Internal Medicine

## 2018-10-16 NOTE — Progress Notes (Signed)
Subjective:    Patient ID: Megan [name], female    DOB: [birth year], 77 y.o.   MRN: [id number]  HPI The patient is here for follow up from the hospital.  Admitted 12/11-12/14/2019 for acute respiratory failure with hypoxia.  She has a history of metastatic lung cancer currently on observation and COPD who went to the emergency room with confusion and falls.  She was febrile and tachycardic when she got to the emergency room and initially confused and was not able to provide much history.  She was experiencing a nonproductive cough.  Her blood sugar was 550 with an anion gap of 17, bicarb 23.  She was hypoxic with an oxygen saturation of 86% on room air.  WBC was 14 she was started on insulin and fluids.  She was started on empiric antibiotics for possible sepsis.  CT of the angiogram chest was ordered and showed a chronic changes.  Acute respiratory failure with hypoxia, history of metastatic lung cancer, history of COPD, sepsis, pneumonia: CT chest unchanged Pulmonary consulted-they advised to treat this as a pneumonia Started on vancomycin and cefepime-transition to Levaquin Discharged home with 5 more days of Levaquin and advised early follow-up to make sure she continues to improve Leukocytosis improved while in the hospital Ambulatory pulse ox 90%, pulse ox at rest 97% Her cough has improved - it is still wet sounding- having difficulty getting it up She is a little SOB - not sure if it has improved, her wheezing is improving She is using her inhalers Her oxygen level here is 97% on room air She is a little lightheaded and feels off a little bit, but thinks this is because of hypoglycemia  Acute encephalopathy: Confused on admission-likely related to infection and hypoxia Resolved  Hypokalemia, hypomagnesia  Repleted She states she is eating and drinking normally  Diabetes, type II: Sugars uncontrolled on admission likely related to acute infection Levemir increased  to 20 mg twice daily upon discharge-she states she is taking her dose prior to going to the hospital.  She had significant difficulty trying to explain to me how she was taking her Levemir Her sugars have been erratic since being home - this morning it was 45 She is a little confused, Her sugar today is 44 here now - given something to drink - CapriSun x 2 -sugar did increase to 55 and she did feel better Advised to call Dr Cruzita Lederer today to review sugars CMP  Mild elevated creatinine Baseline 0.89-1.24 when admitted Mild dehydration Normalized with fluids and treatment of sepsis She is drinking a lot of tea and water - keeps up with the fluids CMP   She is having daily GERD.  It does not matter what she eats.  This is been going on for a while.  She has taken Tums on occasion, but has not taking anything preventatively.  Medications and allergies reviewed with patient and updated if appropriate.  Patient Active Problem List   Diagnosis Date Noted  . GERD (gastroesophageal reflux disease) 10/17/2018  . CAP (community acquired pneumonia) 10/15/2018  . Acute respiratory failure with hypoxia ([place]) 10/11/2018  . Post herpetic neuralgia 03/30/2018  . Herpes zoster without complication 26/71/2458  . Right elbow pain 03/01/2018  . Carpal tunnel syndrome on right 11/30/2017  . Cough 08/22/2017  . Primary osteoarthritis of both hands 03/08/2017  . Poor balance 02/07/2017  . Subarachnoid bleed ([place]) 01/11/2017  . Psoriatic arthropathy ([place]) 09/08/2016  . Hypertension 09/08/2016  . Diabetic  neuropathy ([place]) 09/08/2016  . LADA (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), managed as type 1 ([place]) 05/04/2016  . Syncope 03/25/2016  . Malnutrition of moderate degree 02/29/2016  . Diabetic ketoacidosis with coma associated with diabetes mellitus due to underlying condition ([place])   . Abnormal finding on GI tract imaging   . RLS (restless legs syndrome) 12/11/2015  . Lumbago 05/06/2015  . Peripheral  edema 04/14/2015  . Psoriasis 04/14/2015  . Encounter for antineoplastic immunotherapy 04/06/2015  . Emphysema of lung ([place]) 11/26/2014  . Radiation-induced esophageal stricture 10/01/2014  . Tachycardia 08/27/2014  . DNR (do not resuscitate) discussion 08/21/2014  . Cancer associated pain 07/29/2014  . Malignant cachexia ([place]) 07/29/2014  . Lung collapse 07/29/2014  . Fatigue 07/18/2014  . Neuropathy due to chemotherapeutic drug ([place]) 07/18/2014  . Leg cramps 07/18/2014  . Thrombocytopenia, unspecified ([place]) 07/18/2014  . Dyspnea 04/28/2014  . Radiation esophagitis 04/10/2014  . Right-sided chest wall pain 04/01/2014  . Cancer of middle lobe of lung ([place]) 01/17/2014  . Malignant neoplasm of right upper lobe of lung ([place]) 11/28/2013    Current Outpatient Medications on File Prior to Visit  Medication Sig Dispense Refill  . acetaminophen (TYLENOL) 325 MG tablet Take 2 tablets (650 mg total) by mouth every 6 (six) hours as needed for mild pain (or Fever >/= 101).    [name] albuterol (PROVENTIL HFA;VENTOLIN HFA) 108 (90 Base) MCG/ACT inhaler Inhale 2 puffs into the lungs every 6 (six) hours as needed for wheezing or shortness of breath. 1 Inhaler 2  . betamethasone dipropionate (DIPROLENE) 0.05 % cream Apply topically 2 (two) times daily. 45 g 0  . budesonide-formoterol (SYMBICORT) 80-4.5 MCG/ACT inhaler Inhale 2 puffs into the lungs 2 (two) times daily. (Patient taking differently: Inhale 2 puffs into the lungs daily as needed (Wheezing). ) 1 Inhaler 6  . dextromethorphan-guaiFENesin (MUCINEX DM) 30-600 MG 12hr tablet Take 1 tablet by mouth 2 (two) times daily as needed for cough. 30 tablet 0  . DULoxetine (CYMBALTA) 60 MG capsule Take 1 capsule (60 mg total) by mouth 2 (two) times daily. 180 capsule 4  . gabapentin (NEURONTIN) 300 MG capsule Take 1 capsule (300 mg total) by mouth 3 (three) times daily. 270 capsule 4  . Insulin Detemir (LEVEMIR FLEXTOUCH) 100 UNIT/ML Pen Inject 17 units in the  morning, and 28 units in the pm (Patient taking differently: Inject 17-27 Units into the skin See admin instructions. Inject 17 units in the morning, and 27 units in the pm on Monday to Friday.) 45 mL 0  . insulin lispro (HUMALOG KWIKPEN) 100 UNIT/ML KwikPen INJECT 15 TO 26 UNITS UNDER THE SKIN THREE TIMES DAILY (Patient taking differently: Inject 15-26 Units into the skin See admin instructions. INJECT 15 TO 26 UNITS UNDER THE SKIN THREE TIMES DAILY) 45 mL 0  . levofloxacin (LEVAQUIN) 500 MG tablet Take 1 tablet (500 mg total) by mouth daily for 5 days. 10 tablet 0   No current facility-administered medications on file prior to visit.     Past Medical History:  Diagnosis Date  . Cancer ([place]) dx'd 09/2013   Lung ca  . Concussion 2018  . Diabetes ([place]) 09/08/2016   type I diabetic patient reported on 09/13/16   . Diabetes mellitus    insulin  . Diabetic neuropathy ([place]) 09/08/2016  . Fall 2018  . GERD (gastroesophageal reflux disease)    no meds for  . History of colitis   . History of hiatal hernia   . Hx  of radiation therapy 12/16/13-01/30/14   lung 66Gy  . Hypertension   . Malignant neoplasm of right upper lobe of lung ([place]) 11/28/2013  . Neuropathy   . Psoriasis   . Psoriatic arthritis ([place]) 09/08/2016  . Rheumatoid arthritis ([place])   . Shortness of breath dyspnea    with exertion    Past Surgical History:  Procedure Laterality Date  . ABDOMINAL HYSTERECTOMY    . ABDOMINAL SURGERY    . BALLOON DILATION N/A 12/12/2014   Procedure: BALLOON DILATION;  Surgeon: Inda Castle, MD;  Location: [facility];  Service: Endoscopy;  Laterality: N/A;  . CHOLECYSTECTOMY    . COLONOSCOPY N/A 02/07/2016   Procedure: COLONOSCOPY;  Surgeon: Doran Stabler, MD;  Location: [facility] ENDOSCOPY;  Service: Endoscopy;  Laterality: N/A;  . DILATION AND CURETTAGE OF UTERUS    . ESOPHAGOGASTRODUODENOSCOPY N/A 12/12/2014   Procedure: ESOPHAGOGASTRODUODENOSCOPY (EGD);  Surgeon: Inda Castle, MD;  Location:  [facility];  Service: Endoscopy;  Laterality: N/A;  with dilation  . ESOPHAGOGASTRODUODENOSCOPY (EGD) WITH PROPOFOL N/A 10/09/2014   Procedure: ESOPHAGOGASTRODUODENOSCOPY (EGD) WITH PROPOFOL;  Surgeon: Inda Castle, MD;  Location: [facility];  Service: Endoscopy;  Laterality: N/A;  . ESOPHAGOGASTRODUODENOSCOPY (EGD) WITH PROPOFOL N/A 09/19/2016   Procedure: ESOPHAGOGASTRODUODENOSCOPY (EGD) WITH PROPOFOL;  Surgeon: Doran Stabler, MD;  Location: [facility] ENDOSCOPY;  Service: Gastroenterology;  Laterality: N/A;  with savary dil tt  . FRACTURE SURGERY    . ORIF ANKLE FRACTURE Right 07/04/2015   Procedure: OPEN REDUCTION INTERNAL FIXATION (ORIF) ANKLE FRACTURE;  Surgeon: Newt Minion, MD;  Location: [facility];  Service: Orthopedics;  Laterality: Right;  OPEN REDUCTION, INTERNAL FIXATION OF RIGHT ANKLE FRACTURE.   [name] ORIF ANKLE FRACTURE Left 02/10/2016   Procedure: OPEN REDUCTION INTERNAL FIXATION (ORIF) ANKLE FRACTURE;  Surgeon: Newt Minion, MD;  Location: [facility];  Service: Orthopedics;  Laterality: Left;  . SAVORY DILATION N/A 10/09/2014   Procedure: SAVORY DILATION;  Surgeon: Inda Castle, MD;  Location: [facility];  Service: Endoscopy;  Laterality: N/A;  . SAVORY DILATION N/A 09/19/2016   Procedure: SAVORY DILATION;  Surgeon: Doran Stabler, MD;  Location: [facility] ENDOSCOPY;  Service: Gastroenterology;  Laterality: N/A;  . TONSILLECTOMY      Social History   Socioeconomic History  . Marital status: Divorced    Spouse name: Not on file  . Number of children: 0  . Years of education: MA  . Highest education level: Master's degree (e.g., MA, MS, MEng, MEd, MSW, MBA)  Occupational History  . Occupation: Retired    Fish farm manager: OTHER  Social Needs  . Financial resource strain: Not hard at all  . Food insecurity:    Worry: Never true    Inability: Never true  . Transportation needs:    Medical: No    Non-medical: No  Tobacco Use  . Smoking status: Former Smoker    Packs/day: 3.00     Years: 45.00    Pack years: 135.00    Types: Cigarettes    Last attempt to quit: 12/05/1997    Years since quitting: 20.8  . Smokeless tobacco: Never Used  Substance and Sexual Activity  . Alcohol use: No    Alcohol/week: 0.0 standard drinks    Comment: rarely   . Drug use: No  . Sexual activity: Not Currently  Lifestyle  . Physical activity:    Days per week: 0 days    Minutes per session: 0 min  . Stress: Not at all  Relationships  . Social connections:    Talks on phone: More than three times a week    Gets together: More than three times a week    Attends religious service: More than 4 times per year    Active member of club or organization: Yes    Attends meetings of clubs or organizations: More than 4 times per year    Relationship status: Not on file  Other Topics Concern  . Not on file  Social History Narrative   Patient lives at home alone.   Caffeine Use: 1 cups daily   Right handed    Family History  Problem Relation Age of Onset  . Other Mother   . Other Father        failure to thrive  . Hypertension Other   . Stroke Other   . Heart attack Other   . Hemachromatosis Other   . Rheum arthritis Unknown        both sets of grandparents and father    Review of Systems  Constitutional: Negative for fever (maybe low grade fevers).  Respiratory: Positive for cough, shortness of breath and wheezing.   Cardiovascular: Positive for leg swelling (chronic). Negative for chest pain and palpitations.  Gastrointestinal: Positive for constipation (chronic). Negative for abdominal pain, diarrhea and nausea.       GERD  Neurological: Positive for light-headedness (currently from low sugars). Negative for headaches.       Objective:   Vitals:   10/17/18 0937  BP: 116/68  Pulse: (!) 101  Resp: 18  Temp: 98.2 F (36.8 C)  SpO2: 97%   BP Readings from Last 3 Encounters:  10/17/18 116/68  10/13/18 129/74  08/01/18 131/65   Wt Readings from Last 3 Encounters:    10/17/18 188 lb 12.8 oz (85.6 kg)  10/10/18 183 lb 7 oz (83.2 kg)  08/01/18 186 lb 3.2 oz (84.5 kg)   Body mass index is 30.47 kg/m.   Physical Exam    Constitutional: Appears well-developed and well-nourished. No distress.  HENT:  Head: Normocephalic and atraumatic.  Neck: Neck supple. No tracheal deviation present. No thyromegaly present.  No cervical lymphadenopathy Cardiovascular: Normal rate, regular rhythm and normal heart sounds.   2/6 systolic murmur heard.  1 + non pitting b/l LE edema Pulmonary/Chest: Effort normal. No respiratory distress. Inspiratory and expiratory wheezes b/l - right side more.  No rales. Abdomen: Soft, nontender, nondistended Skin: Skin is warm and dry. Not diaphoretic.  Psychiatric: Normal mood and affect. Behavior is normal.   CT HEAD WO CONTRAST CLINICAL DATA:  Recent falls and altered level of consciousness, initial encounter  EXAM: CT HEAD WITHOUT CONTRAST  TECHNIQUE: Contiguous axial images were obtained from the base of the skull through the vertex without intravenous contrast.  COMPARISON:  01/11/2017  FINDINGS: Brain: No evidence of acute infarction, hemorrhage, hydrocephalus, extra-axial collection or mass lesion/mass effect.  Vascular: No hyperdense vessel or unexpected calcification.  Skull: Normal. Negative for fracture or focal lesion.  Sinuses/Orbits: No acute finding.  Other: Chronic bilateral nasal bone fractures are seen.  IMPRESSION: No acute intracranial abnormality noted.  Electronically Signed   By: Inez Catalina M.D.   On: 10/11/2018 07:38 CT Angio Chest PE W and/or Wo Contrast CLINICAL DATA:  Cough, fever, shortness of breath and weakness since yesterday. History of lung cancer.  EXAM: CT ANGIOGRAPHY CHEST WITH CONTRAST  TECHNIQUE: Multidetector CT imaging of the chest was performed using the standard protocol during bolus administration of intravenous  contrast. Multiplanar CT image reconstructions  and MIPs were obtained to evaluate the vascular anatomy.  CONTRAST:  118 cc Isovue 370  COMPARISON:  Chest radiographs dated 10/10/2018 and chest CT dated 07/24/2018.  FINDINGS: Cardiovascular: Normally opacified pulmonary arteries with no pulmonary arterial filling defects seen. Stable swirling of the vessels in the medial aspect of the right lower lung zone. Small pericardial effusion with a maximum thickness of 12 mm, without significant change.  Mediastinum/Nodes: No enlarged lymph nodes. Stable mediastinal shift to the right. Small hiatal hernia.  Lungs/Pleura: Again demonstrated is dense collapsed lung against the mediastinum medially on the right. There is progressive dense collapsed lung in the medial aspect of the right lower lung zone with interval occlusion of the bronchi leading into that area at a location of confluent soft tissue density in the right hilum that has mildly progressed. Progressive perivascular soft tissue thickening extending and in the right suprahilar region. The previously noted 2-4 mm pulmonary nodules in the left lung are not visualized today. Stable left lower lobe calcified granuloma.  Upper Abdomen: Diffuse low density of the liver relative to the spleen without significant change. The previously demonstrated 2.0 x 1.4 cm oval area of low density in the anterior aspect of the lateral segment of the left lobe of the liver is not as well visualized, currently measuring approximately 1.5 x 1.2 cm on image number 133 series 7. The previously DISH in all smaller areas of low density in the liver are no longer visualized. Multiple calcified splenic granulomata are again demonstrated.  Musculoskeletal: Thoracic spine degenerative changes. Lower cervical spine degenerative changes.  Review of the MIP images confirms the above findings.  IMPRESSION: 1. Progressive soft tissue density in the right hilum, medial right lower lung zone and right  suprahilar region with interval occlusion of the lower lobe bronchi, suspicious for tumor recurrence. Progressive post radiation changes are less likely but possible. 2. No pulmonary emboli seen. 3. Stable diffuse hepatic steatosis. 4. Interval decrease in size and conspicuity of the previously demonstrated probable cyst in the left lobe of the liver and the other previously demonstrated probable smaller cyst are no longer visible. 5. The previously seen small left lung nodules are no longer visualized.  Electronically Signed   By: Claudie Revering M.D.   On: 10/11/2018 02:28    Lab Results  Component Value Date   WBC 11.5 (H) 10/17/2018   HGB 12.5 10/17/2018   HCT 38.2 10/17/2018   PLT 308.0 10/17/2018   GLUCOSE 40 (LL) 10/17/2018   CHOL 201 (H) 11/14/2016   TRIG 79.0 11/14/2016   HDL 57.40 11/14/2016   LDLCALC 128 (H) 11/14/2016   ALT 17 10/17/2018   AST 14 10/17/2018   NA 142 10/17/2018   K 3.4 (L) 10/17/2018   CL 103 10/17/2018   CREATININE 0.79 10/17/2018   BUN 13 10/17/2018   CO2 30 10/17/2018   TSH 8.675 (H) 02/28/2016   INR 1.17 10/10/2018   HGBA1C 8.3 02/20/2018    Assessment & Plan:    See Problem List for Assessment and Plan of chronic medical problems.

## 2018-10-17 ENCOUNTER — Ambulatory Visit: Payer: Medicare Other | Admitting: Internal Medicine

## 2018-10-17 ENCOUNTER — Other Ambulatory Visit (INDEPENDENT_AMBULATORY_CARE_PROVIDER_SITE_OTHER): Payer: Medicare Other

## 2018-10-17 ENCOUNTER — Other Ambulatory Visit: Payer: Medicare Other

## 2018-10-17 ENCOUNTER — Encounter: Payer: Self-pay | Admitting: Internal Medicine

## 2018-10-17 ENCOUNTER — Telehealth: Payer: Self-pay | Admitting: Internal Medicine

## 2018-10-17 VITALS — BP 116/68 | HR 101 | Temp 98.2°F | Resp 18 | Ht 66.0 in | Wt 188.8 lb

## 2018-10-17 DIAGNOSIS — D729 Disorder of white blood cells, unspecified: Secondary | ICD-10-CM

## 2018-10-17 DIAGNOSIS — J9601 Acute respiratory failure with hypoxia: Secondary | ICD-10-CM

## 2018-10-17 DIAGNOSIS — E876 Hypokalemia: Secondary | ICD-10-CM

## 2018-10-17 DIAGNOSIS — E139 Other specified diabetes mellitus without complications: Secondary | ICD-10-CM | POA: Diagnosis not present

## 2018-10-17 DIAGNOSIS — J189 Pneumonia, unspecified organism: Secondary | ICD-10-CM

## 2018-10-17 DIAGNOSIS — K219 Gastro-esophageal reflux disease without esophagitis: Secondary | ICD-10-CM

## 2018-10-17 DIAGNOSIS — I10 Essential (primary) hypertension: Secondary | ICD-10-CM

## 2018-10-17 DIAGNOSIS — E860 Dehydration: Secondary | ICD-10-CM

## 2018-10-17 LAB — COMPREHENSIVE METABOLIC PANEL
ALBUMIN: 3.5 g/dL (ref 3.5–5.2)
ALK PHOS: 147 U/L — AB (ref 39–117)
ALT: 17 U/L (ref 0–35)
AST: 14 U/L (ref 0–37)
BUN: 13 mg/dL (ref 6–23)
CO2: 30 mEq/L (ref 19–32)
Calcium: 9 mg/dL (ref 8.4–10.5)
Chloride: 103 mEq/L (ref 96–112)
Creatinine, Ser: 0.79 mg/dL (ref 0.40–1.20)
GFR: 75.03 mL/min (ref >60.00)
Glucose, Bld: 40 mg/dL — CL (ref 70–99)
Potassium: 3.4 mEq/L — ABNORMAL LOW (ref 3.5–5.1)
Sodium: 142 mEq/L (ref 135–145)
TOTAL PROTEIN: 6.7 g/dL (ref 6.0–8.3)
Total Bilirubin: 0.3 mg/dL (ref 0.2–1.2)

## 2018-10-17 LAB — CBC WITH DIFFERENTIAL/PLATELET
Basophils Absolute: 0.1 10*3/uL (ref 0.0–0.1)
Basophils Relative: 0.9 % (ref 0.0–3.0)
Eosinophils Absolute: 0.3 10*3/uL (ref 0.0–0.7)
Eosinophils Relative: 2.7 % (ref 0.0–5.0)
HEMATOCRIT: 38.2 % (ref 36.0–46.0)
Hemoglobin: 12.5 g/dL (ref 12.0–15.0)
LYMPHS PCT: 11.9 % — AB (ref 12.0–46.0)
Lymphs Abs: 1.4 10*3/uL (ref 0.7–4.0)
MCHC: 32.7 g/dL (ref 30.0–36.0)
MCV: 86.2 fl (ref 78.0–100.0)
Monocytes Absolute: 0.9 10*3/uL (ref 0.1–1.0)
Monocytes Relative: 8 % (ref 3.0–12.0)
Neutro Abs: 8.8 10*3/uL — ABNORMAL HIGH (ref 1.4–7.7)
Neutrophils Relative %: 76.5 % (ref 43.0–77.0)
Platelets: 308 10*3/uL (ref 150.0–400.0)
RBC: 4.44 Mil/uL (ref 3.87–5.11)
RDW: 17.3 % — ABNORMAL HIGH (ref 11.5–15.5)
WBC: 11.5 10*3/uL — ABNORMAL HIGH (ref 4.0–10.5)

## 2018-10-17 MED ORDER — HYDROCODONE-HOMATROPINE 5-1.5 MG/5ML PO SYRP: 5.0000 mL | ORAL_SOLUTION | Freq: Three times a day (TID) | ORAL | 0 refills | Status: DC | PRN

## 2018-10-17 MED ORDER — FAMOTIDINE 20 MG PO TABS: 20.0000 mg | ORAL_TABLET | Freq: Two times a day (BID) | ORAL | 5 refills | Status: DC

## 2018-10-17 NOTE — Assessment & Plan Note (Addendum)
New Having daily GERD Start pepcid 20 mg twice daily Call or return if symptoms do not improve/resolve

## 2018-10-17 NOTE — Assessment & Plan Note (Signed)
Following with Dr. Cruzita Lederer When she was admitted her sugars were very high-likely related to infection Sugars are variable at home prior to hospitalization and since being home they have been on the low side-often 40s-50s in the morning She had a very difficult time explaining to me how she was taking the Levemir and said it was on a rotating basis-it was not the same every day Advised her to call Dr. Cruzita Lederer today for advice regarding adjusting her insulin needs a follow-up visit.  I did send her a note as well She did have a low sugar here today 44-given Capri sun x2 and sugar increased to 55.  She was feeling better and had just eaten a protein bar Glucose on CMP 40 To see Dr. Cruzita Lederer tomorrow Advise very close monitoring of her sugars until she is seen tomorrow

## 2018-10-17 NOTE — Telephone Encounter (Signed)
Stacy, Thank you for the update.  I have not seen her in a while.  We will try to get her in tomorrow.  In the meantime, I will reduce her bedtime insulin. Salena Saner

## 2018-10-17 NOTE — Assessment & Plan Note (Signed)
Not currently on medication-she asked if she needed any and told her that she did not Monitor off medication for now

## 2018-10-17 NOTE — Assessment & Plan Note (Signed)
Slightly elevated creatinine when she went to the hospital-likely related to sepsis and dehydration Normalized with fluids She is currently drinking a lot of liquids at home CMP today

## 2018-10-17 NOTE — Assessment & Plan Note (Addendum)
Received vancomycin and cefepime in the hospital Completing Levaquin at home Overall symptoms have improved Oxygenation better here-97% on room air Cough has improved, wheezes improving Still has shortness of breath, but has chronic shortness of breath and she is unsure if it has improved or not Continue inhalers Advised her that if she does not see continued improvement she needs to come back for evaluation CMP, CBC today

## 2018-10-17 NOTE — Telephone Encounter (Signed)
FYI --   I advised Megan [name] to call your office today.  Her sugars have been running in the 40's in the morning and she just got out of the hospital.  Her sugar here today was in the 40's and she was confused - it improved after juice.    She was not able to explain her insulin regimen to me and I told her you would be best to advise her on changing it.

## 2018-10-17 NOTE — Patient Instructions (Addendum)
Get blood work done today.  Call Dr Cruzita Lederer to get advise regarding your sugars.    Medication changes: starting pepcid twice daily for your reflux.  Use the cough syrup as needed.  Continue all your other medications.    Follow up with me in 2 weeks

## 2018-10-17 NOTE — Assessment & Plan Note (Signed)
Had hypokalemia and hypomagnesemia while in hospital Repleted CMP

## 2018-10-17 NOTE — Assessment & Plan Note (Signed)
Secondary to community-acquired pneumonia Improved with antibiotics, Oxygen saturation here 97% on room air discharged home on Levaquin and will complete that Continue inhalers She did have some mild inspiratory and expiratory wheezes bilaterally-more in the right side, no crackles, good air entry Advised that she needs to monitor her respiratory symptoms closely and if they do not continue to improve she needs to be seen again

## 2018-10-18 ENCOUNTER — Ambulatory Visit: Payer: Medicare Other | Admitting: Internal Medicine

## 2018-10-18 ENCOUNTER — Telehealth: Payer: Self-pay | Admitting: Internal Medicine

## 2018-10-18 ENCOUNTER — Encounter: Payer: Self-pay | Admitting: Internal Medicine

## 2018-10-18 VITALS — BP 102/64 | HR 94 | Ht 66.0 in | Wt 184.0 lb

## 2018-10-18 DIAGNOSIS — E139 Other specified diabetes mellitus without complications: Secondary | ICD-10-CM

## 2018-10-18 LAB — POCT GLYCOSYLATED HEMOGLOBIN (HGB A1C): Hemoglobin A1C: 9 % — AB (ref 4.0–5.6)

## 2018-10-18 LAB — PATHOLOGIST SMEAR REVIEW

## 2018-10-18 MED ORDER — INSULIN DETEMIR 100 UNIT/ML FLEXPEN: PEN_INJECTOR | SUBCUTANEOUS | 3 refills | Status: DC

## 2018-10-18 MED ORDER — GLUCAGON (RDNA) 1 MG IJ KIT: 1.0000 mg | PACK | Freq: Once | INTRAMUSCULAR | 12 refills | Status: DC | PRN

## 2018-10-18 MED ORDER — INSULIN LISPRO (1 UNIT DIAL) 100 UNIT/ML (KWIKPEN): 10.0000 [IU] | PEN_INJECTOR | SUBCUTANEOUS | 3 refills | Status: DC

## 2018-10-18 NOTE — Patient Instructions (Addendum)
Please continue: - Levemir 7 units in am and 27 at bedtime  Please change: - Humalog mealtime dose  - small meal: 10 units  - regular meal: 15 units - large meal: 20 units  Please do not skip the insulin if sugars are 70-100.   If the sugars are 60-70, take 1/2 of the Humalog dose.  If the sugars are <60, skip insulin with that meal  No Humalog at bedtime, unless sugars >300: take 5 units or >400: 8 units  Please call your insurance to see if we can use a Dexcom G6 CGM sensor.  Please get the Glucagon pen from the pharmacy.   Please return in 3 months with your sugar log.

## 2018-10-18 NOTE — Progress Notes (Signed)
Patient ID: Megan [name], female   DOB: 12/11/1940, 77 y.o.   MRN: [id number]  HPI: Megan [name] is a 77 y.o.-year-old female, returning for f/u for DM2, dx in 2013, and as LADA in 04/2016, insulin-dependent since 2014, uncontrolled, with complications (DKA, ? Related PN). Last visit 8 months ago.  This appointment was scheduled emergently after she had an episode of low blood sugar, at 40, in PCPs office yesterday.  She is here with her friend and caregiver, Megan [name], who offers part of the history about her diabetes management and especially the low-dose and asks questions about how to manage her lows.  Of note, she was in the hospital recently for pneumonia and started on levofloxacin.  She continues this now.  Reviewed history: She has a complicated medical history, with a diagnosis of ulcerative colitis with significant diarrhea that caused dehydration in the past and episodes of DKA. She had 2 admissions for this in 02/26/2016 and 04/14/2016. While in rehabilitation, her CBGs dropped to 30s on her previous dose of insulin, therefore, her long-acting insulin was decreased.   She also has a history of lung cancer diagnosed 09/2013.  She had chemotherapy and radiation therapy for this.  Last hemoglobin A1c was: Lab Results  Component Value Date   HGBA1C 8.3 02/20/2018   HGBA1C 8.8 11/17/2017   HGBA1C 6.8 08/17/2017  12/30/2015: 12% 09/18/2015: 11.3% 06/17/2015: 7.5% 04/08/2015: 10.9% 09/10/2014: 8.5%  11/04/2013: 6.2%  Now on: - Levemir 20 >> 7 units in am and 25 >> 27 units at bedtime - Humalog mealtime dose: 20-26 units before meals   Please do not skip the insulin if sugars are 70-100.   If the sugars are 60-70, take 1/2 of the Humalog dose.  If the sugars are <60, skip insulin with that meal  No Humalog at bedtime, unless sugars >300: take 5 units or >400: 8 units She had a SSI before, but was not using it.  Pt checks her sugars 4 times a day but has  neuropathy in her right hand and cannot use the glucometer well) -still very fluctuating: - am: 175-379, 438 >> 136, 188-355, 418 >> 153-363, 401, 448 - 2h after b'fast: 171-528 >> n/c >> 59 >> n/c - before lunch:  100-281, 412 >> 73-262, 335 >> 29, 44, 76-235, 326, 333 - 2h after lunch:  n/c >> 47 >> 79 >> 67 >> 34, 46 - before dinner:  66, 130-528 >> 99, 166-400 >> 47, 138-472, 528 - 2h after dinner: n/c >> 128-479 >> n/c  >> 88 - bedtime:108-352, 430 >> 91, 119-340, 483 >> 125-430 - nighttime: n/c >> 57 x1 >> n/c Lowest sugar was 47 >> 66 >> 67 >> 29; she has hypoglycemia awareness in the 50s. Highest sugar was 500s >> 528 >> 483 >> 528  Pt's meals are: - Breakfast: Cereal or oatmeal with blueberries or eggs with sausage - Lunch: Sandwich and soup - Dinner: Meat, vegetables, small amount of starch - Snacks: 3-4 a day: Peanuts  -No CKD, last BUN/creatinine:  Lab Results  Component Value Date   BUN 13 10/17/2018   BUN 5 (L) 10/13/2018   CREATININE 0.79 10/17/2018   CREATININE 0.89 10/13/2018   -+ HL: last set of lipids: Lab Results  Component Value Date   CHOL 201 (H) 11/14/2016   HDL 57.40 11/14/2016   LDLCALC 128 (H) 11/14/2016   TRIG 79.0 11/14/2016   CHOLHDL 4 11/14/2016   - last eye exam was in 12/2017: No  DR Dr. Lady [name]. -+ Numbness and tingling in feet.  She has PN from ChTx.  Off Lyrica, did not start Neurontin yet.  ROS: Constitutional: no weight gain/no weight loss, + fatigue, no subjective hyperthermia, no subjective hypothermia Eyes: + Blurry vision, no xerophthalmia ENT: no sore throat, no nodules palpated in neck, no dysphagia, no odynophagia, + hoarseness Cardiovascular: no CP/+ SOB/no palpitations/+ leg swelling Respiratory: + cough/+ SOB/+ wheezing Gastrointestinal: no N/no V/no D/+ constipation/+ heartburn Musculoskeletal: no muscle aches/no joint aches Skin: no rashes, no hair loss Neurological: no tremors/no numbness/no tingling/no dizziness  I  reviewed pt's medications, allergies, PMH, social hx, family hx, and changes were documented in the history of present illness. Otherwise, unchanged from my initial visit note.  Past Medical History:  Diagnosis Date  . Cancer ([place]) dx'd 09/2013   Lung ca  . Concussion 2018  . Diabetes ([place]) 09/08/2016   type I diabetic patient reported on 09/13/16   . Diabetes mellitus    insulin  . Diabetic neuropathy ([place]) 09/08/2016  . Fall 2018  . GERD (gastroesophageal reflux disease)    no meds for  . History of colitis   . History of hiatal hernia   . Hx of radiation therapy 12/16/13-01/30/14   lung 66Gy  . Hypertension   . Malignant neoplasm of right upper lobe of lung ([place]) 11/28/2013  . Neuropathy   . Psoriasis   . Psoriatic arthritis ([place]) 09/08/2016  . Rheumatoid arthritis ([place])   . Shortness of breath dyspnea    with exertion   Past Surgical History:  Procedure Laterality Date  . ABDOMINAL HYSTERECTOMY    . ABDOMINAL SURGERY    . BALLOON DILATION N/A 12/12/2014   Procedure: BALLOON DILATION;  Surgeon: Inda Castle, MD;  Location: [facility];  Service: Endoscopy;  Laterality: N/A;  . CHOLECYSTECTOMY    . COLONOSCOPY N/A 02/07/2016   Procedure: COLONOSCOPY;  Surgeon: Doran Stabler, MD;  Location: [facility] ENDOSCOPY;  Service: Endoscopy;  Laterality: N/A;  . DILATION AND CURETTAGE OF UTERUS    . ESOPHAGOGASTRODUODENOSCOPY N/A 12/12/2014   Procedure: ESOPHAGOGASTRODUODENOSCOPY (EGD);  Surgeon: Inda Castle, MD;  Location: [facility];  Service: Endoscopy;  Laterality: N/A;  with dilation  . ESOPHAGOGASTRODUODENOSCOPY (EGD) WITH PROPOFOL N/A 10/09/2014   Procedure: ESOPHAGOGASTRODUODENOSCOPY (EGD) WITH PROPOFOL;  Surgeon: Inda Castle, MD;  Location: [place];  Service: Endoscopy;  Laterality: N/A;  . ESOPHAGOGASTRODUODENOSCOPY (EGD) WITH PROPOFOL N/A 09/19/2016   Procedure: ESOPHAGOGASTRODUODENOSCOPY (EGD) WITH PROPOFOL;  Surgeon: Doran Stabler, MD;  Location: [facility] ENDOSCOPY;   Service: Gastroenterology;  Laterality: N/A;  with savary dil tt  . FRACTURE SURGERY    . ORIF ANKLE FRACTURE Right 07/04/2015   Procedure: OPEN REDUCTION INTERNAL FIXATION (ORIF) ANKLE FRACTURE;  Surgeon: Newt Minion, MD;  Location: [place];  Service: Orthopedics;  Laterality: Right;  OPEN REDUCTION, INTERNAL FIXATION OF RIGHT ANKLE FRACTURE.   [name] ORIF ANKLE FRACTURE Left 02/10/2016   Procedure: OPEN REDUCTION INTERNAL FIXATION (ORIF) ANKLE FRACTURE;  Surgeon: Newt Minion, MD;  Location: [place];  Service: Orthopedics;  Laterality: Left;  . SAVORY DILATION N/A 10/09/2014   Procedure: SAVORY DILATION;  Surgeon: Inda Castle, MD;  Location: [place];  Service: Endoscopy;  Laterality: N/A;  . SAVORY DILATION N/A 09/19/2016   Procedure: SAVORY DILATION;  Surgeon: Doran Stabler, MD;  Location: [facility] ENDOSCOPY;  Service: Gastroenterology;  Laterality: N/A;  . TONSILLECTOMY     Social History   Social History  .  Marital Status: Divorced    Spouse Name: N/A  . Number of Children: 0   Occupational History  . Retired Pharmacist, hospital Other   Social History Main Topics  . Smoking status: Former Smoker -- 3.00 packs/day for 45 years    Types: Cigarettes    Quit date: 12/05/1997  . Smokeless tobacco: Never Used  . Alcohol Use: No  . Drug Use: No   Social History Narrative   Patient lives at home alone.   Caffeine Use: 2 cups daily   Current Outpatient Medications on File Prior to Visit  Medication Sig Dispense Refill  . acetaminophen (TYLENOL) 325 MG tablet Take 2 tablets (650 mg total) by mouth every 6 (six) hours as needed for mild pain (or Fever >/= 101).    [name] albuterol (PROVENTIL HFA;VENTOLIN HFA) 108 (90 Base) MCG/ACT inhaler Inhale 2 puffs into the lungs every 6 (six) hours as needed for wheezing or shortness of breath. 1 Inhaler 2  . betamethasone dipropionate (DIPROLENE) 0.05 % cream Apply topically 2 (two) times daily. 45 g 0  . budesonide-formoterol (SYMBICORT) 80-4.5 MCG/ACT inhaler  Inhale 2 puffs into the lungs 2 (two) times daily. (Patient taking differently: Inhale 2 puffs into the lungs daily as needed (Wheezing). ) 1 Inhaler 6  . dextromethorphan-guaiFENesin (MUCINEX DM) 30-600 MG 12hr tablet Take 1 tablet by mouth 2 (two) times daily as needed for cough. 30 tablet 0  . DULoxetine (CYMBALTA) 60 MG capsule Take 1 capsule (60 mg total) by mouth 2 (two) times daily. 180 capsule 4  . famotidine (PEPCID) 20 MG tablet Take 1 tablet (20 mg total) by mouth 2 (two) times daily. 60 tablet 5  . gabapentin (NEURONTIN) 300 MG capsule Take 1 capsule (300 mg total) by mouth 3 (three) times daily. 270 capsule 4  . HYDROcodone-homatropine (HYCODAN) 5-1.5 MG/5ML syrup Take 5 mLs by mouth every 8 (eight) hours as needed for cough. 120 mL 0  . Insulin Detemir (LEVEMIR FLEXTOUCH) 100 UNIT/ML Pen Inject 17 units in the morning, and 28 units in the pm (Patient taking differently: Inject 17-27 Units into the skin See admin instructions. Inject 17 units in the morning, and 27 units in the pm on Monday to Friday.) 45 mL 0  . insulin lispro (HUMALOG KWIKPEN) 100 UNIT/ML KwikPen INJECT 15 TO 26 UNITS UNDER THE SKIN THREE TIMES DAILY (Patient taking differently: Inject 15-26 Units into the skin See admin instructions. INJECT 15 TO 26 UNITS UNDER THE SKIN THREE TIMES DAILY) 45 mL 0  . levofloxacin (LEVAQUIN) 500 MG tablet Take 1 tablet (500 mg total) by mouth daily for 5 days. 10 tablet 0   No current facility-administered medications on file prior to visit.    Allergies  Allergen Reactions  . Carboplatin Other (See Comments)    Neuropathy  . Remicade [Infliximab] Anaphylaxis  . Hydromorphone Rash   Family History  Problem Relation Age of Onset  . Other Mother   . Other Father        failure to thrive  . Hypertension Other   . Stroke Other   . Heart attack Other   . Hemachromatosis Other   . Rheum arthritis Unknown        both sets of grandparents and father   PE: BP 102/64 (BP Location:  Right Arm, Patient Position: Sitting, Cuff Size: Normal)   Pulse 94   Ht 5\' 6"  (1.676 m)   Wt 184 lb (83.5 kg)   SpO2 98%   BMI 29.70 kg/m  Wt Readings from Last 3 Encounters:  10/18/18 184 lb (83.5 kg)  10/17/18 188 lb 12.8 oz (85.6 kg)  10/10/18 183 lb 7 oz (83.2 kg)   Constitutional: overweight, in NAD Eyes: PERRLA, EOMI, no exophthalmos ENT: moist mucous membranes, no thyromegaly, no cervical lymphadenopathy Cardiovascular: tachycardia, RR, No MRG Respiratory: CTA B Gastrointestinal: abdomen soft, NT, ND, BS+ Musculoskeletal: no deformities, strength intact in all 4 Skin: moist, warm, no rashes Neurological: no tremor with outstretched hands, DTR normal in all 4  ASSESSMENT: 1. LADA, insulin-dependent, uncontrolled, with complications - DKA in the setting of dehydration (- PN) - this may be 2/2 ChTx.  Pulmonologist: Dr. Chase Caller Oncologist: Dr. Earlie Server Cardiologist: Dr. Sallyanne Kuster Ophthalmologist: Dr. Clifton James  2.  Peripheral neuropathy - Unclear if also from Diabetes  PLAN:  1. Patient with longstanding, poorly controlled LADA, with very fluctuating CBGs, returning after an 58-month absence.  I was contacted by PCP yesterday that patient had a hypoglycemic episode while in the office (blood glucose of 40).  At that time, we contacted the patient to decrease her Lantus dose at night (she did not do so).  We also scheduled the appointment today. -At last visit, we redistributed her Lantus to take a lower dose in the morning and the higher dose at night as her sugars are higher in the morning -Reviewed previous HbA1c, which was above target, at 8.3% at last visit, a decrease from 8.8%. -Per review of her log, sugars are still extremely fluctuating, but she had more lows in the last week.  We discussed that this could be due to her treatment with levofloxacin, which is known to cause hypoglycemia. -We decided to decrease the dose of her Humalog with meals since she is taking  a fairly large dose, 26 units with all of her meals.  She wakes up with high blood sugars and then they drop before lunch, as low as 20s.  Her friend, Megan [name], had to call 911 several times about management of her lows. -At this visit, besides decreasing the dose of Humalog, I also demonstrated glucagon pen use to the patient and her friend.  He also practiced by himself and appears to have understood how to administer the injection.  I sent a prescription to her pharmacy.  Discussed situations in which glucagon should be used.  Advised him about timing of recovery and also how to recognize the need to call 911 or to take her to the hospital. -We also discussed about the benefits of a CGM and I advised her to call her insurance to see if a Dexcom CGM is covered.  I explained that this will give her alarms to alert her that her blood sugars are dropping.  I feel this is absolutely mandatory for her right now.  They also inquire about an insulin pump, however, I explained that low blood sugars can also happen while she is on a pump and her insulin regimen will get even more complicated.  Regardless, at next visit, sugars do not improve after starting the CGM, we should definitely try this avenue for treatment - I suggested to:  Patient Instructions  Please continue: - Levemir 7 units in am and 27 at bedtime  Please change: - Humalog mealtime dose  - small meal: 10 units  - regular meal: 15 units - large meal: 20 units  Please do not skip the insulin if sugars are 70-100.   If the sugars are 60-70, take 1/2 of the Humalog dose.  If the  sugars are <60, skip insulin with that meal  No Humalog at bedtime, unless sugars >300: take 5 units or >400: 8 units  Please call your insurance to see if we can use a Dexcom G6 CGM sensor.  Please get the Glucagon pen from the pharmacy.   Please return in 3 months with your sugar log.   - today, HbA1c is 9.0% (higher) - continue checking sugars at different  times of the day - check 3-4x a day, rotating checks - advised for yearly eye exams >> she is UTD - Return to clinic in 3 mo with sugar log  - I strongly advised him to contact me if she continues to have low blood sugars or if her sugars increase significantly  2. PN  -Stable, no pain, only numbness -She was previously on Lyrica, now off  - time spent with the patient and her friend: 40 minutes, of which >50% was spent in obtaining information about her recent hypoglycemia episodes, reviewing her previous labs, evaluations, and treatments, possible causes for recent increase hypoglycemia, counseling her about her condition and also demonstrating glucagon pen use and indication (please see the discussed topics above), discussing about available CGM's and plans on the market; they had a number of questions which I addressed.  Philemon Kingdom, MD PhD [facility] Endocrinology

## 2018-10-18 NOTE — Telephone Encounter (Signed)
Patient wanted to call and let Dr Cruzita Lederer know that her insurance will cover the Dexcom.   She also provided the number for [facility]. [facility] [phone]

## 2018-10-19 ENCOUNTER — Ambulatory Visit: Payer: Medicare Other | Admitting: Internal Medicine

## 2018-10-19 ENCOUNTER — Telehealth: Payer: Self-pay | Admitting: Internal Medicine

## 2018-10-19 NOTE — Telephone Encounter (Signed)
LVM with ok for verbal orders.

## 2018-10-19 NOTE — Telephone Encounter (Signed)
LVM ok for verbal.

## 2018-10-19 NOTE — Telephone Encounter (Signed)
Copied from [place] [id number]. Topic: Quick Communication - Home Health Verbal Orders >> Oct 19, 2018  1:54 PM Yvette Rack wrote: Caller/Agency: Leroy [facility] / [place] Number: [phone] -  may leave message Requesting OT/PT/Skilled Nursing/Social Work: PT Frequency: 1 time a week for 4 weeks - balance and endurance

## 2018-10-19 NOTE — Telephone Encounter (Signed)
Great! Can you please ask her to call [facility] and establish an account with them and then have them fax me the prescription request for the sensor, transmitted and receiver. I will sign and fax back.

## 2018-10-19 NOTE — Telephone Encounter (Signed)
Please place order for pt

## 2018-10-19 NOTE — Telephone Encounter (Signed)
Pt stated that she would call today

## 2018-10-19 NOTE — Telephone Encounter (Signed)
Copied from [place] [id number]. Topic: Quick Communication - Home Health Verbal Orders >> Oct 19, 2018 10:11 AM Vernona Rieger wrote: Caller/Agency: Myriam Jacobson RN with [facility] Number: [phone] Requesting OT/PT/Skilled Nursing/Social Work: Nursing Frequency: 1 time a week for the next 4 weeks

## 2018-10-29 ENCOUNTER — Telehealth: Payer: Self-pay | Admitting: Internal Medicine

## 2018-10-29 NOTE — Telephone Encounter (Signed)
[facility] is calling to confirm we have received the request for CMN and office notes for the patient.    Joylene [phone]

## 2018-10-29 NOTE — Telephone Encounter (Signed)
This has been completed and faxed back.

## 2018-11-01 NOTE — Progress Notes (Signed)
Subjective:    Patient ID: Megan [name], female    DOB: Jun 06, 1941, 78 y.o.   MRN: [id number]  HPI The patient is here for follow up.  Diabetes:  She has followed up with Dr Cruzita Lederer.  Her sugars have been better until two days ago the sugar dropped to 72 - she did get dizzy. This morning her sugar was 214.  She has been eating a high-protein breakfast most mornings and thinks that is why her sugar may have been low the other day.  She has been compliant with a low sugar/carbohydrate diet.  She has been trying to be more active.  H/o lung cancer, chronic SOB, recent PNA:  She feels her breathing is good and at her baseline.  She has a chronic cough that is dry and sometimes productive of brown sputum, shortness of breath with exertion and intermittent wheezing.  She has not had any fevers or chills.  She has seen improvement in her energy level, appetite and cough since being out of the hospital.  She has a CT scan scheduled for April with oncology to monitor her lung cancer.  UTI: She is currently following with urology and taking an antibiotic.  Hypertension: There is no hypertension medication on her list.  She states she is taking the medication on a daily basis for blood pressure-?  Amlodipine.  She is compliant with low-sodium diet.  She denies any chest pain or palpitations.  Medications and allergies reviewed with patient and updated if appropriate.  Patient Active Problem List   Diagnosis Date Noted  . GERD (gastroesophageal reflux disease) 10/17/2018  . CAP (community acquired pneumonia) 10/15/2018  . Acute respiratory failure with hypoxia ([place]) 10/11/2018  . Post herpetic neuralgia 03/30/2018  . Herpes zoster without complication 37/90/2409  . Right elbow pain 03/01/2018  . Carpal tunnel syndrome on right 11/30/2017  . Cough 08/22/2017  . Hypokalemia 03/22/2017  . Primary osteoarthritis of both hands 03/08/2017  . Poor balance 02/07/2017  . Subarachnoid bleed ([place])  01/11/2017  . Psoriatic arthropathy ([place]) 09/08/2016  . Hypertension 09/08/2016  . Diabetic neuropathy ([place]) 09/08/2016  . LADA (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), managed as type 1 ([place]) 05/04/2016  . Syncope 03/25/2016  . Malnutrition of moderate degree 02/29/2016  . Diabetic ketoacidosis with coma associated with diabetes mellitus due to underlying condition ([place])   . RLS (restless legs syndrome) 12/11/2015  . Lumbago 05/06/2015  . Peripheral edema 04/14/2015  . Psoriasis 04/14/2015  . Emphysema of lung ([place]) 11/26/2014  . Radiation-induced esophageal stricture 10/01/2014  . Tachycardia 08/27/2014  . DNR (do not resuscitate) discussion 08/21/2014  . Cancer associated pain 07/29/2014  . Malignant cachexia ([place]) 07/29/2014  . Lung collapse 07/29/2014  . Fatigue 07/18/2014  . Neuropathy due to chemotherapeutic drug ([place]) 07/18/2014  . Dehydration 07/18/2014  . Leg cramps 07/18/2014  . Thrombocytopenia, unspecified ([place]) 07/18/2014  . Dyspnea 04/28/2014  . Radiation esophagitis 04/10/2014  . Right-sided chest wall pain 04/01/2014  . Cancer of middle lobe of lung ([place]) 01/17/2014  . Malignant neoplasm of right upper lobe of lung ([place]) 11/28/2013    Current Outpatient Medications on File Prior to Visit  Medication Sig Dispense Refill  . acetaminophen (TYLENOL) 325 MG tablet Take 2 tablets (650 mg total) by mouth every 6 (six) hours as needed for mild pain (or Fever >/= 101).    [name] albuterol (PROVENTIL HFA;VENTOLIN HFA) 108 (90 Base) MCG/ACT inhaler Inhale 2 puffs into the lungs every 6 (six)  hours as needed for wheezing or shortness of breath. 1 Inhaler 2  . betamethasone dipropionate (DIPROLENE) 0.05 % cream Apply topically 2 (two) times daily. 45 g 0  . budesonide-formoterol (SYMBICORT) 80-4.5 MCG/ACT inhaler Inhale 2 puffs into the lungs 2 (two) times daily. (Patient taking differently: Inhale 2 puffs into the lungs daily as needed (Wheezing). ) 1 Inhaler 6  . cephALEXin  (KEFLEX) 250 MG capsule Take by mouth 4 (four) times daily.    . DULoxetine (CYMBALTA) 60 MG capsule Take 1 capsule (60 mg total) by mouth 2 (two) times daily. 180 capsule 4  . famotidine (PEPCID) 20 MG tablet Take 1 tablet (20 mg total) by mouth 2 (two) times daily. 60 tablet 5  . gabapentin (NEURONTIN) 300 MG capsule Take 1 capsule (300 mg total) by mouth 3 (three) times daily. 270 capsule 4  . glucagon (GLUCAGON EMERGENCY) 1 MG injection Inject 1 mg into the muscle once as needed for up to 1 dose. 1 each 12  . Insulin Detemir (LEVEMIR FLEXTOUCH) 100 UNIT/ML Pen Inject 7 units in the morning, and 27 units in the pm 45 mL 3  . insulin lispro (HUMALOG KWIKPEN) 100 UNIT/ML KwikPen Inject 0.1-0.2 mLs (10-20 Units total) into the skin See admin instructions. 15 pen 3  . sulfamethoxazole-trimethoprim (BACTRIM DS,SEPTRA DS) 800-160 MG tablet Take 1 tablet by mouth 2 (two) times daily.     No current facility-administered medications on file prior to visit.     Past Medical History:  Diagnosis Date  . Cancer ([place]) dx'd 09/2013   Lung ca  . Concussion 2018  . Diabetes ([place]) 09/08/2016   type I diabetic patient reported on 09/13/16   . Diabetes mellitus    insulin  . Diabetic neuropathy ([place]) 09/08/2016  . Fall 2018  . GERD (gastroesophageal reflux disease)    no meds for  . History of colitis   . History of hiatal hernia   . Hx of radiation therapy 12/16/13-01/30/14   lung 66Gy  . Hypertension   . Malignant neoplasm of right upper lobe of lung ([place]) 11/28/2013  . Neuropathy   . Psoriasis   . Psoriatic arthritis ([place]) 09/08/2016  . Rheumatoid arthritis ([place])   . Shortness of breath dyspnea    with exertion    Past Surgical History:  Procedure Laterality Date  . ABDOMINAL HYSTERECTOMY    . ABDOMINAL SURGERY    . BALLOON DILATION N/A 12/12/2014   Procedure: BALLOON DILATION;  Surgeon: Inda Castle, MD;  Location: [facility];  Service: Endoscopy;  Laterality: N/A;  . CHOLECYSTECTOMY      . COLONOSCOPY N/A 02/07/2016   Procedure: COLONOSCOPY;  Surgeon: Doran Stabler, MD;  Location: [facility] ENDOSCOPY;  Service: Endoscopy;  Laterality: N/A;  . DILATION AND CURETTAGE OF UTERUS    . ESOPHAGOGASTRODUODENOSCOPY N/A 12/12/2014   Procedure: ESOPHAGOGASTRODUODENOSCOPY (EGD);  Surgeon: Inda Castle, MD;  Location: [facility];  Service: Endoscopy;  Laterality: N/A;  with dilation  . ESOPHAGOGASTRODUODENOSCOPY (EGD) WITH PROPOFOL N/A 10/09/2014   Procedure: ESOPHAGOGASTRODUODENOSCOPY (EGD) WITH PROPOFOL;  Surgeon: Inda Castle, MD;  Location: [facility];  Service: Endoscopy;  Laterality: N/A;  . ESOPHAGOGASTRODUODENOSCOPY (EGD) WITH PROPOFOL N/A 09/19/2016   Procedure: ESOPHAGOGASTRODUODENOSCOPY (EGD) WITH PROPOFOL;  Surgeon: Doran Stabler, MD;  Location: [facility] ENDOSCOPY;  Service: Gastroenterology;  Laterality: N/A;  with savary dil tt  . FRACTURE SURGERY    . ORIF ANKLE FRACTURE Right 07/04/2015   Procedure: OPEN REDUCTION INTERNAL FIXATION (ORIF) ANKLE  FRACTURE;  Surgeon: Newt Minion, MD;  Location: [facility];  Service: Orthopedics;  Laterality: Right;  OPEN REDUCTION, INTERNAL FIXATION OF RIGHT ANKLE FRACTURE.   [name] ORIF ANKLE FRACTURE Left 02/10/2016   Procedure: OPEN REDUCTION INTERNAL FIXATION (ORIF) ANKLE FRACTURE;  Surgeon: Newt Minion, MD;  Location: [place] [facility];  Service: Orthopedics;  Laterality: Left;  . SAVORY DILATION N/A 10/09/2014   Procedure: SAVORY DILATION;  Surgeon: Inda Castle, MD;  Location: [facility];  Service: Endoscopy;  Laterality: N/A;  . SAVORY DILATION N/A 09/19/2016   Procedure: SAVORY DILATION;  Surgeon: Doran Stabler, MD;  Location: [facility];  Service: Gastroenterology;  Laterality: N/A;  . TONSILLECTOMY      Social History   Socioeconomic History  . Marital status: Divorced    Spouse name: Not on file  . Number of children: 0  . Years of education: MA  . Highest education level: Master's degree (e.g., MA, MS, MEng, MEd, MSW, MBA)   Occupational History  . Occupation: Retired    Fish farm manager: OTHER  Social Needs  . Financial resource strain: Not hard at all  . Food insecurity:    Worry: Never true    Inability: Never true  . Transportation needs:    Medical: No    Non-medical: No  Tobacco Use  . Smoking status: Former Smoker    Packs/day: 3.00    Years: 45.00    Pack years: 135.00    Types: Cigarettes    Last attempt to quit: 12/05/1997    Years since quitting: 20.9  . Smokeless tobacco: Never Used  Substance and Sexual Activity  . Alcohol use: No    Alcohol/week: 0.0 standard drinks    Comment: rarely   . Drug use: No  . Sexual activity: Not Currently  Lifestyle  . Physical activity:    Days per week: 0 days    Minutes per session: 0 min  . Stress: Not at all  Relationships  . Social connections:    Talks on phone: More than three times a week    Gets together: More than three times a week    Attends religious service: More than 4 times per year    Active member of club or organization: Yes    Attends meetings of clubs or organizations: More than 4 times per year    Relationship status: Not on file  Other Topics Concern  . Not on file  Social History Narrative   Patient lives at home alone.   Caffeine Use: 1 cups daily   Right handed    Family History  Problem Relation Age of Onset  . Other Mother   . Other Father        failure to thrive  . Hypertension Other   . Stroke Other   . Heart attack Other   . Hemachromatosis Other   . Rheum arthritis Unknown        both sets of grandparents and father    Review of Systems  Constitutional: Negative for chills and fever.  Respiratory: Positive for cough (productive at times - brown in color - chronic cough), shortness of breath (chronic) and wheezing (chronic, no change).   Cardiovascular: Positive for leg swelling (chronic). Negative for chest pain and palpitations.  Gastrointestinal: Negative for abdominal pain and nausea.  Neurological:  Positive for light-headedness (occ). Negative for headaches.       Objective:   Vitals:   11/02/18 1025  BP: 120/82  Pulse: (!) 105  Resp: 16  Temp: 97.9 F (36.6 C)  SpO2: 98%   BP Readings from Last 3 Encounters:  11/02/18 120/82  10/18/18 102/64  10/17/18 116/68   Wt Readings from Last 3 Encounters:  11/02/18 175 lb (79.4 kg)  10/18/18 184 lb (83.5 kg)  10/17/18 188 lb 12.8 oz (85.6 kg)   Body mass index is 28.25 kg/m.   Physical Exam    Constitutional: Appears well-developed and well-nourished. No distress.  HENT:  Head: Normocephalic and atraumatic.  Neck: Neck supple. No tracheal deviation present. No thyromegaly present.  No cervical lymphadenopathy Cardiovascular: Normal rate, regular rhythm and normal heart sounds.   No murmur heard. No carotid bruit .  Trace bilateral lower extremity edema Pulmonary/Chest: Effort normal and breath sounds normal. No respiratory distress. No has no wheezes. No rales.  Skin: Skin is warm and dry. Not diaphoretic.  Psychiatric: Normal mood and affect. Behavior is normal.      Assessment & Plan:    See Problem List for Assessment and Plan of chronic medical problems.

## 2018-11-02 ENCOUNTER — Encounter: Payer: Self-pay | Admitting: Internal Medicine

## 2018-11-02 ENCOUNTER — Ambulatory Visit: Payer: Medicare Other | Admitting: Internal Medicine

## 2018-11-02 ENCOUNTER — Other Ambulatory Visit: Payer: Self-pay | Admitting: Internal Medicine

## 2018-11-02 ENCOUNTER — Telehealth: Payer: Self-pay | Admitting: Internal Medicine

## 2018-11-02 DIAGNOSIS — C342 Malignant neoplasm of middle lobe, bronchus or lung: Secondary | ICD-10-CM

## 2018-11-02 DIAGNOSIS — I10 Essential (primary) hypertension: Secondary | ICD-10-CM

## 2018-11-02 DIAGNOSIS — E139 Other specified diabetes mellitus without complications: Secondary | ICD-10-CM

## 2018-11-02 NOTE — Telephone Encounter (Signed)
Patient would like some training on her Dexcom that she received in the mail today. She stated she does not know how to work this and would like a call back  if this is possible to set up with [name]   Please advise

## 2018-11-02 NOTE — Patient Instructions (Addendum)
Try vitamin B 12 1000 mcg daily.   Medications reviewed and updated.  Changes include :   none    Please followup in 6 months

## 2018-11-02 NOTE — Telephone Encounter (Signed)
Please schedule with [name].

## 2018-11-02 NOTE — Telephone Encounter (Signed)
Done

## 2018-11-02 NOTE — Assessment & Plan Note (Signed)
Sugars have improved-she did have 1 low sugar of 72 Advised her to call Dr. Cruzita Lederer if there are any concerns regarding her sugars

## 2018-11-02 NOTE — Assessment & Plan Note (Signed)
Blood pressure looks good here today ?  Taking a blood pressure medication or non-she states she is taking something-possibly amlodipine Advised her check her medication list when she gets home and let us know if she is taking some things we can keep her medication list up-to-date

## 2018-11-02 NOTE — Telephone Encounter (Signed)
Dr Megan [name] has to place a referral for her to do training with [name] before she can be scheduled.

## 2018-11-02 NOTE — Assessment & Plan Note (Signed)
Following with oncology - has f/u ct scan in April At baseline with chronic symptoms - cough, wheeze, sob PNA resolved clinically  Continue inhalers Sees pulmonary but has not seen them recently

## 2018-11-13 ENCOUNTER — Encounter: Payer: Medicare Other | Attending: Internal Medicine | Admitting: Nutrition

## 2018-11-13 DIAGNOSIS — E139 Other specified diabetes mellitus without complications: Secondary | ICD-10-CM | POA: Diagnosis not present

## 2018-11-14 NOTE — Progress Notes (Signed)
Patient was trained on how to use the Dexcom 6.  A sensor was inserted into her right upper abdomen, and the scanner was set up to read and record the results.   We tried to get the signal to go to her phone, but it was not able to recognize the password.  She was told to call Dexcom with help for this.  Telephone number was given.  She had no final questions. Alerts were put in for readings lower than 80, and higher than 250.

## 2018-11-14 NOTE — Patient Instructions (Signed)
Bring scanner to office at every visit. Call Dexcom with help for phone app

## 2018-11-27 ENCOUNTER — Encounter: Payer: Medicare Other | Admitting: Nutrition

## 2018-11-27 DIAGNOSIS — E139 Other specified diabetes mellitus without complications: Secondary | ICD-10-CM

## 2018-11-27 NOTE — Progress Notes (Signed)
Patient had put in a new Dexcom sensor correctly, but did not put in the transmitter correctly.  She was shown how to do this, and she did this correctly.  She had no final questions.

## 2018-11-27 NOTE — Patient Instructions (Signed)
Insert small end of the transmitter first, and push larger end in afterwards, until it clicks.  Then press start sensor, on your hand held device

## 2018-11-29 ENCOUNTER — Other Ambulatory Visit: Payer: Self-pay | Admitting: Internal Medicine

## 2018-12-03 ENCOUNTER — Telehealth: Payer: Self-pay | Admitting: Dietician

## 2018-12-03 NOTE — Telephone Encounter (Signed)
Returned patient call. Patient changed her Dexcom G-6 and as having problems getting the transmitter out. Patient stated that she called the Dexcom line and they were able to help her. No further issues at this time.  Antonieta Iba, RD, LDN, CDE

## 2019-01-02 LAB — HM DIABETES EYE EXAM

## 2019-01-03 ENCOUNTER — Telehealth: Payer: Self-pay | Admitting: Internal Medicine

## 2019-01-03 NOTE — Telephone Encounter (Signed)
Pal moved appt from 04/07 to 04/06. Called to confirm with patient.

## 2019-01-18 ENCOUNTER — Encounter: Payer: Self-pay | Admitting: Internal Medicine

## 2019-01-19 ENCOUNTER — Other Ambulatory Visit: Payer: Self-pay | Admitting: Internal Medicine

## 2019-02-01 ENCOUNTER — Encounter (HOSPITAL_COMMUNITY): Payer: Self-pay

## 2019-02-01 ENCOUNTER — Inpatient Hospital Stay: Payer: Medicare Other | Attending: Internal Medicine

## 2019-02-01 ENCOUNTER — Ambulatory Visit (HOSPITAL_COMMUNITY)
Admission: RE | Admit: 2019-02-01 | Discharge: 2019-02-01 | Disposition: A | Discharge status: Home / Self Care | Payer: Medicare Other | Source: Ambulatory Visit | Attending: Internal Medicine | Admitting: Internal Medicine

## 2019-02-01 ENCOUNTER — Other Ambulatory Visit: Payer: Self-pay

## 2019-02-01 DIAGNOSIS — C3490 Malignant neoplasm of unspecified part of unspecified bronchus or lung: Secondary | ICD-10-CM | POA: Diagnosis present

## 2019-02-01 DIAGNOSIS — M069 Rheumatoid arthritis, unspecified: Secondary | ICD-10-CM | POA: Diagnosis not present

## 2019-02-01 DIAGNOSIS — Z885 Allergy status to narcotic agent status: Secondary | ICD-10-CM | POA: Insufficient documentation

## 2019-02-01 DIAGNOSIS — K219 Gastro-esophageal reflux disease without esophagitis: Secondary | ICD-10-CM | POA: Diagnosis not present

## 2019-02-01 DIAGNOSIS — E1040 Type 1 diabetes mellitus with diabetic neuropathy, unspecified: Secondary | ICD-10-CM | POA: Diagnosis not present

## 2019-02-01 DIAGNOSIS — Z794 Long term (current) use of insulin: Secondary | ICD-10-CM | POA: Insufficient documentation

## 2019-02-01 DIAGNOSIS — Z79899 Other long term (current) drug therapy: Secondary | ICD-10-CM | POA: Diagnosis not present

## 2019-02-01 DIAGNOSIS — I10 Essential (primary) hypertension: Secondary | ICD-10-CM | POA: Insufficient documentation

## 2019-02-01 LAB — CBC WITH DIFFERENTIAL (CANCER CENTER ONLY)
Abs Immature Granulocytes: 0.35 10*3/uL — ABNORMAL HIGH (ref 0.00–0.07)
Basophils Absolute: 0.1 10*3/uL (ref 0.0–0.1)
Basophils Relative: 1 %
Eosinophils Absolute: 0.3 10*3/uL (ref 0.0–0.5)
Eosinophils Relative: 3 %
HCT: 44 % (ref 36.0–46.0)
Hemoglobin: 13.8 g/dL (ref 12.0–15.0)
Immature Granulocytes: 4 %
Lymphocytes Relative: 24 %
Lymphs Abs: 2.3 10*3/uL (ref 0.7–4.0)
MCH: 28 pg (ref 26.0–34.0)
MCHC: 31.4 g/dL (ref 30.0–36.0)
MCV: 89.2 fL (ref 80.0–100.0)
Monocytes Absolute: 0.7 10*3/uL (ref 0.1–1.0)
Monocytes Relative: 7 %
Neutro Abs: 5.8 10*3/uL (ref 1.7–7.7)
Neutrophils Relative %: 61 %
Platelet Count: 269 10*3/uL (ref 150–400)
RBC: 4.93 MIL/uL (ref 3.87–5.11)
RDW: 15.9 % — ABNORMAL HIGH (ref 11.5–15.5)
WBC Count: 9.5 10*3/uL (ref 4.0–10.5)
nRBC: 0 % (ref 0.0–0.2)

## 2019-02-01 LAB — CMP (CANCER CENTER ONLY)
ALT: 14 U/L (ref 0–44)
AST: 14 U/L — ABNORMAL LOW (ref 15–41)
Albumin: 3.5 g/dL (ref 3.5–5.0)
Alkaline Phosphatase: 143 U/L — ABNORMAL HIGH (ref 38–126)
Anion gap: 10 (ref 5–15)
BUN: 22 mg/dL (ref 8–23)
CO2: 31 mmol/L (ref 22–32)
Calcium: 9.6 mg/dL (ref 8.9–10.3)
Chloride: 99 mmol/L (ref 98–111)
Creatinine: 1.14 mg/dL — ABNORMAL HIGH (ref 0.44–1.00)
GFR, Est AFR Am: 54 mL/min — ABNORMAL LOW (ref >60)
GFR, Estimated: 46 mL/min — ABNORMAL LOW (ref >60)
Glucose, Bld: 99 mg/dL (ref 70–99)
Potassium: 4.1 mmol/L (ref 3.5–5.1)
Sodium: 140 mmol/L (ref 135–145)
Total Bilirubin: 0.4 mg/dL (ref 0.3–1.2)
Total Protein: 7 g/dL (ref 6.5–8.1)

## 2019-02-04 ENCOUNTER — Encounter: Payer: Self-pay | Admitting: Internal Medicine

## 2019-02-04 ENCOUNTER — Inpatient Hospital Stay (HOSPITAL_BASED_OUTPATIENT_CLINIC_OR_DEPARTMENT_OTHER): Payer: Medicare Other | Admitting: Internal Medicine

## 2019-02-04 DIAGNOSIS — C342 Malignant neoplasm of middle lobe, bronchus or lung: Secondary | ICD-10-CM | POA: Diagnosis not present

## 2019-02-04 DIAGNOSIS — Z9221 Personal history of antineoplastic chemotherapy: Secondary | ICD-10-CM

## 2019-02-04 DIAGNOSIS — C3490 Malignant neoplasm of unspecified part of unspecified bronchus or lung: Secondary | ICD-10-CM

## 2019-02-04 DIAGNOSIS — Z923 Personal history of irradiation: Secondary | ICD-10-CM | POA: Diagnosis not present

## 2019-02-04 NOTE — Progress Notes (Signed)
Virtual Visit via Telephone Note  I connected with Megan [name] on 02/04/19 at 12:15 PM EDT by telephone and verified that I am speaking with the correct person using two identifiers.   I discussed the limitations, risks, security and privacy concerns of performing an evaluation and management service by telephone and the availability of in person appointments. I also discussed with the patient that there may be a patient responsible charge related to this service. The patient expressed understanding and agreed to proceed.       [place] Telephone:([phone]) [phone]   Fax:([phone]) [phone]  OFFICE PROGRESS NOTE  Binnie Rail, MD [place] [address]  DIAGNOSIS: Squamous cell lung cancer  Primary site: Lung (Right)  Staging method: AJCC 7th Edition  Clinical: Stage IIIB (T3, N3, M0)  Summary: Stage IIIB (T3, N3, M0)   PRIOR THERAPY:  1) Concurrent chemoradiation with weekly chemotherapy in the form of carboplatin for AUC of 2 and paclitaxel 45 mg/m2. Status post 7 week of treatment. Last dose was given 01/27/2014 no significant response in her disease.  2) Consolidation chemotherapy with carboplatin for AUC of 5 and paclitaxel 175 mg/M2 every 3 weeks with Neulasta support. First dose on 04/15/2014. Status post 3 cycles. Carboplatin was discontinued starting cycle #2 secondary to hypersensitivity reaction. 3) Immunotherapy with Opdivo (Nivolumab) 3mg /kg given every 2 weeks. Status post 33 cycles discontinued secondary to intolerance.  CURRENT THERAPY: Observation.  History of Present Illness: Megan [name] 78 y.o. female has a visual telephone visit with me today for evaluation and discussion of her scan results.  The patient is feeling fine with no concerning complaints.  The patient denied having any current chest pain, shortness of breath, cough or hemoptysis.  She denied having any fever or chills.  She has no nausea, vomiting, diarrhea  or constipation.  She has no headache or visual changes.  She has been on observation and feeling well.  She had repeat CT scan of the chest performed recently and this phone visit is for evaluation and discussion of her scans and recommendation regarding her condition.  MEDICAL HISTORY: Past Medical History:  Diagnosis Date   Cancer ([place]) dx'd 09/2013   Lung ca   Concussion 2018   Diabetes ([place]) 09/08/2016   type I diabetic patient reported on 09/13/16    Diabetes mellitus    insulin   Diabetic neuropathy ([place]) 09/08/2016   Fall 2018   GERD (gastroesophageal reflux disease)    no meds for   History of colitis    History of hiatal hernia    Hx of radiation therapy 12/16/13-01/30/14   lung 66Gy   Hypertension    Malignant neoplasm of right upper lobe of lung ([place]) 11/28/2013   Neuropathy    Psoriasis    Psoriatic arthritis ([place]) 09/08/2016   Rheumatoid arthritis ([facility])    Shortness of breath dyspnea    with exertion    ALLERGIES:  is allergic to carboplatin; remicade [infliximab]; and hydromorphone.  MEDICATIONS:  Current Outpatient Medications  Medication Sig Dispense Refill   acetaminophen (TYLENOL) 325 MG tablet Take 2 tablets (650 mg total) by mouth every 6 (six) hours as needed for mild pain (or Fever >/= 101).     albuterol (PROVENTIL HFA;VENTOLIN HFA) 108 (90 Base) MCG/ACT inhaler Inhale 2 puffs into the lungs every 6 (six) hours as needed for wheezing or shortness of breath. 1 Inhaler 2   betamethasone dipropionate (DIPROLENE) 0.05 % cream Apply topically  2 (two) times daily. 45 g 0   budesonide-formoterol (SYMBICORT) 80-4.5 MCG/ACT inhaler Inhale 2 puffs into the lungs 2 (two) times daily. (Patient taking differently: Inhale 2 puffs into the lungs daily as needed (Wheezing). ) 1 Inhaler 6   cephALEXin (KEFLEX) 250 MG capsule Take by mouth 4 (four) times daily.     DULoxetine (CYMBALTA) 60 MG capsule Take 1 capsule (60 mg total) by mouth 2 (two) times  daily. 180 capsule 4   famotidine (PEPCID) 20 MG tablet Take 1 tablet (20 mg total) by mouth 2 (two) times daily. 60 tablet 5   gabapentin (NEURONTIN) 300 MG capsule Take 1 capsule (300 mg total) by mouth 3 (three) times daily. 270 capsule 4   glucagon (GLUCAGON EMERGENCY) 1 MG injection Inject 1 mg into the muscle once as needed for up to 1 dose. 1 each 12   HUMALOG KWIKPEN 100 UNIT/ML KwikPen INJECT 15 TO 26 UNITS UNDER THE SKIN THREE TIMES DAILY 45 mL 2   Insulin Detemir (LEVEMIR FLEXTOUCH) 100 UNIT/ML Pen Inject 7 units in the morning, and 27 units in the pm 45 mL 3   Insulin Pen Needle (B-D UF III MINI PEN NEEDLES) 31G X 5 MM MISC USE TO CHECK BLOOD SUGAR FIVE TIMES DAILY 100 each 4   sulfamethoxazole-trimethoprim (BACTRIM DS,SEPTRA DS) 800-160 MG tablet Take 1 tablet by mouth 2 (two) times daily.     No current facility-administered medications for this visit.     SURGICAL HISTORY:  Past Surgical History:  Procedure Laterality Date   ABDOMINAL HYSTERECTOMY     ABDOMINAL SURGERY     BALLOON DILATION N/A 12/12/2014   Procedure: BALLOON DILATION;  Surgeon: Inda Castle, MD;  Location: [facility];  Service: Endoscopy;  Laterality: N/A;   CHOLECYSTECTOMY     COLONOSCOPY N/A 02/07/2016   Procedure: COLONOSCOPY;  Surgeon: Doran Stabler, MD;  Location: [facility] END[facility]COPY;  Service: Endoscopy;  Laterality: N/A;   DILATION AND CURETTAGE OF UTERUS     ESOPHAGOGASTRODUODENOSCOPY N/A 12/12/2014   Procedure: ESOPHAGOGASTRODUODENOSCOPY (EGD);  Surgeon: Inda Castle, MD;  Location: [facility];  Service: Endoscopy;  Laterality: N/A;  with dilation   ESOPHAGOGASTRODUODENOSCOPY (EGD) WITH PROPOFOL N/A 10/09/2014   Procedure: ESOPHAGOGASTRODUODENOSCOPY (EGD) WITH PROPOFOL;  Surgeon: Inda Castle, MD;  Location: [facility];  Service: Endoscopy;  Laterality: N/A;   ESOPHAGOGASTRODUODENOSCOPY (EGD) WITH PROPOFOL N/A 09/19/2016   Procedure: ESOPHAGOGASTRODUODENOSCOPY (EGD) WITH  PROPOFOL;  Surgeon: Doran Stabler, MD;  Location: [facility];  Service: Gastroenterology;  Laterality: N/A;  with savary dil tt   FRACTURE SURGERY     ORIF ANKLE FRACTURE Right 07/04/2015   Procedure: OPEN REDUCTION INTERNAL FIXATION (ORIF) ANKLE FRACTURE;  Surgeon: Newt Minion, MD;  Location: [facility];  Service: Orthopedics;  Laterality: Right;  OPEN REDUCTION, INTERNAL FIXATION OF RIGHT ANKLE FRACTURE.    ORIF ANKLE FRACTURE Left 02/10/2016   Procedure: OPEN REDUCTION INTERNAL FIXATION (ORIF) ANKLE FRACTURE;  Surgeon: Newt Minion, MD;  Location: [facility];  Service: Orthopedics;  Laterality: Left;   SAVORY DILATION N/A 10/09/2014   Procedure: SAVORY DILATION;  Surgeon: Inda Castle, MD;  Location: [facility];  Service: Endoscopy;  Laterality: N/A;   SAVORY DILATION N/A 09/19/2016   Procedure: SAVORY DILATION;  Surgeon: Doran Stabler, MD;  Location: [facility];  Service: Gastroenterology;  Laterality: N/A;   TONSILLECTOMY      REVIEW OF SYSTEMS:  A comprehensive review of systems was negative.   LABORATORY  DATA: Lab Results  Component Value Date   WBC 9.5 02/01/2019   HGB 13.8 02/01/2019   HCT 44.0 02/01/2019   MCV 89.2 02/01/2019   PLT 269 02/01/2019      Chemistry      Component Value Date/Time   NA 140 02/01/2019 1411   NA 141 09/08/2017 1213   K 4.1 02/01/2019 1411   K 4.1 09/08/2017 1213   CL 99 02/01/2019 1411   CO2 31 02/01/2019 1411   CO2 28 09/08/2017 1213   BUN 22 02/01/2019 1411   BUN 13.8 09/08/2017 1213   CREATININE 1.14 (H) 02/01/2019 1411   CREATININE 0.9 09/08/2017 1213   GLU 176 03/07/2016      Component Value Date/Time   CALCIUM 9.6 02/01/2019 1411   CALCIUM 9.4 09/08/2017 1213   ALKPHOS 143 (H) 02/01/2019 1411   ALKPHOS 182 (H) 09/08/2017 1213   AST 14 (L) 02/01/2019 1411   AST 15 09/08/2017 1213   ALT 14 02/01/2019 1411   ALT 17 09/08/2017 1213   BILITOT 0.4 02/01/2019 1411   BILITOT 0.45 09/08/2017 1213       RADIOGRAPHIC  STUDIES: Ct Chest Wo Contrast  Result Date: 02/02/2019 CLINICAL DATA:  Followup indeterminate right lung opacity. Previous chemotherapy and radiation therapy for right lung non-small-cell carcinoma. EXAM: CT CHEST WITHOUT CONTRAST TECHNIQUE: Multidetector CT imaging of the chest was performed following the standard protocol without IV contrast. COMPARISON:  10/11/2018, 07/24/2018, and earlier studies dating back to 05/17/2017 FINDINGS: Cardiovascular: No acute findings. Aortic and coronary artery atherosclerosis. Mediastinum/Nodes: No masses or pathologically enlarged lymph nodes identified on this unenhanced exam. Lungs/Pleura: Stable postop changes from right upper lobectomy. Stable central peribronchial scarring in the central right upper lung zone. Stable atelectasis in the right middle lobe. No centrally obstructing mass is visualized, and these findings remains stable compared to multiple previous studies. Small calcified granuloma in the left lower lobe. A few other tiny less than 5 mm left lung nodules are also stable. No new or enlarging pulmonary nodules or masses identified. No evidence of pleural effusion. Upper Abdomen: Stable small left hepatic lobe cyst. Otherwise unremarkable. Musculoskeletal:  No suspicious bone lesions. IMPRESSION: Stable post treatment changes in right lung and right middle lobe atelectasis compared to multiple prior exams. No evidence of recurrent or metastatic carcinoma, or other acute findings. Aortic Atherosclerosis (ICD10-I70.0). Coronary artery atherosclerosis. Electronically Signed   By: Earle Gell M.D.   On: 02/02/2019 09:13    ASSESSMENT AND PLAN:  This is a very pleasant 78 years old white female with metastatic non-small cell lung cancer status post a course of concurrent chemoradiation as well as consolidation chemotherapy discontinued secondary to intolerance and significant neuropathy. The patient was treated with immunotherapy with Nivolumab status post 33  cycles and tolerated the treatment well but it was discontinued secondary to chemotherapy-induced colitis and diarrhea. The patient is currently on observation and she is feeling fine with no concerning complaints. She had repeat CT scan of the chest performed recently.  I personally and independently reviewed the scans and discussed the results with the patient today. Her scan showed no concerning findings for disease recurrence or progression. I recommended for the patient to continue on observation with repeat CT scan of the chest in 6 months. She was advised to call immediately if she has any concerning symptoms in the interval.  Disclaimer: This note was dictated with voice recognition software. Similar sounding words can inadvertently be transcribed and may not be corrected  upon review.  Follow Up Instructions: Lab, scan and follow-up visit in 6 months.   I discussed the assessment and treatment plan with the patient. The patient was provided an opportunity to ask questions and all were answered. The patient agreed with the plan and demonstrated an understanding of the instructions.   The patient was advised to call back or seek an in-person evaluation if the symptoms worsen or if the condition fails to improve as anticipated.  I provided 13 minutes of non-face-to-face time during this encounter.   Eilleen Kempf, MD

## 2019-02-05 ENCOUNTER — Ambulatory Visit: Payer: Medicare Other | Admitting: Internal Medicine

## 2019-02-07 ENCOUNTER — Ambulatory Visit (INDEPENDENT_AMBULATORY_CARE_PROVIDER_SITE_OTHER): Payer: Medicare Other | Admitting: Internal Medicine

## 2019-02-07 ENCOUNTER — Encounter: Payer: Self-pay | Admitting: Internal Medicine

## 2019-02-07 DIAGNOSIS — G620 Drug-induced polyneuropathy: Secondary | ICD-10-CM

## 2019-02-07 DIAGNOSIS — E139 Other specified diabetes mellitus without complications: Secondary | ICD-10-CM | POA: Diagnosis not present

## 2019-02-07 DIAGNOSIS — T451X5A Adverse effect of antineoplastic and immunosuppressive drugs, initial encounter: Secondary | ICD-10-CM

## 2019-02-07 NOTE — Progress Notes (Signed)
Patient ID: Megan [name], female   DOB: October 31, 1941, 78 y.o.   MRN: [id number]  Patient location: Home My location: Office  Referring Provider: Binnie Rail, MD  I connected with the patient on 02/07/19 at  2:13 PM EDT by telephone and verified that I am speaking with the correct person.   I discussed the limitations of evaluation and management by telephone and the availability of in person appointments. The patient expressed understanding and agreed to proceed.   Details of the encounter are shown below.  HPI: Megan [name] is a 78 y.o.-year-old female, presenting for f/u for DM2, dx in 2013, and as LADA in 04/2016, insulin-dependent since 2014, uncontrolled, with complications (DKA, ? Related PN). Last visit 4 months ago. At last visit, HbA1c was higher, after she was in the hospital for pneumonia.  Reviewed history: She has a complicated medical history, with a diagnosis of ulcerative colitis with significant diarrhea that caused dehydration in the past and episodes of DKA. She had 2 admissions for this in 02/26/2016 and 04/14/2016. While in rehabilitation, her CBGs dropped to 30s on her previous dose of insulin, therefore, her long-acting insulin was decreased.   She also has a history of lung cancer diagnosed 09/2013.  She had chemotherapy and radiation therapy for this.  Last hemoglobin A1c was: Lab Results  Component Value Date   HGBA1C 9.0 (A) 10/18/2018   HGBA1C 8.3 02/20/2018   HGBA1C 8.8 11/17/2017  12/30/2015: 12% 09/18/2015: 11.3% 06/17/2015: 7.5% 04/08/2015: 10.9% 09/10/2014: 8.5%  11/04/2013: 6.2%  Now on: - Levemir 7 units in a.m. and 27 at bedtime - Humalog mealtime dose  - small meal: 10 units  - regular meal: 15 units - large meal: 20 units  Please do not skip the insulin if sugars are 70-100.   If the sugars are 60-70, take 1/2 of the Humalog dose.  If the sugars are <60, skip insulin with that meal  No Humalog at bedtime, unless sugars  >300: take 5 units or >400: 8 units She had a SSI before, but was not using it.  She inquired about an insulin pump in the past, but after we discussed about the complexity of managing this, we decided to continue with insulin pens.  She checks her sugars more than 4 times a day now with her CGM:   Previously: - am:  136, 188-355, 418 >> 153-363, 401, 448 - 2h after b'fast: 171-528 >> n/c >> 59 >> n/c - before lunch: 73-262, 335 >> 29, 44, 76-235, 326, 333 - 2h after lunch: 47 >> 79 >> 67 >> 34, 46 - before dinner: 99, 166-400 >> 47, 138-472, 528 - 2h after dinner: 128-479 >> n/c  >> 88 - bedtime: 91, 119-340, 483 >> 125-430 - nighttime: n/c >> 57 x1 >> n/c Lowest sugar was 29 >> 45; she has hypoglycemia awareness in the 50s. Highest sugar was 528 >> 350.  Pt's meals are: - Breakfast: Cereal or oatmeal with blueberries or eggs with sausage - Lunch: Sandwich and soup - Dinner: Meat, vegetables, small amount of starch - Snacks: 3-4 a day: Peanuts  -No history of CKD, last BUN/creatinine:  Lab Results  Component Value Date   BUN 22 02/01/2019   BUN 13 10/17/2018   CREATININE 1.14 (H) 02/01/2019   CREATININE 0.79 10/17/2018   -+ HL: last set of lipids: Lab Results  Component Value Date   CHOL 201 (H) 11/14/2016   HDL 57.40 11/14/2016   LDLCALC 128 (H) 11/14/2016  TRIG 79.0 11/14/2016   CHOLHDL 4 11/14/2016   - last eye exam was in 12/2018: No DR; Dr. Lady Gary. -+ Numbness in her feet.  She has PN from ChTx and possibly also from diabetes.  She is off Lyrica.  ROS: Constitutional: no weight gain/no weight loss, no fatigue, no subjective hyperthermia, no subjective hypothermia Eyes: no blurry vision, no xerophthalmia ENT: no sore throat, no nodules palpated in neck, no dysphagia, no odynophagia, no hoarseness Cardiovascular: no CP/+ SOB/no palpitations/no leg swelling Respiratory: no cough/+ SOB/+ wheezing Gastrointestinal: no N/no V/no D/no C/no acid  reflux Musculoskeletal: no muscle aches/no joint aches Skin: no rashes, no hair loss Neurological: no tremors/+ numbness/no tingling/no dizziness  I reviewed pt's medications, allergies, PMH, social hx, family hx, and changes were documented in the history of present illness. Otherwise, unchanged from my initial visit note.  Past Medical History:  Diagnosis Date  . Cancer ([place]) dx'd 09/2013   Lung ca  . Concussion 2018  . Diabetes ([place]) 09/08/2016   type I diabetic patient reported on 09/13/16   . Diabetes mellitus    insulin  . Diabetic neuropathy ([place]) 09/08/2016  . Fall 2018  . GERD (gastroesophageal reflux disease)    no meds for  . History of colitis   . History of hiatal hernia   . Hx of radiation therapy 12/16/13-01/30/14   lung 66Gy  . Hypertension   . Malignant neoplasm of right upper lobe of lung ([place]) 11/28/2013  . Neuropathy   . Psoriasis   . Psoriatic arthritis ([place]) 09/08/2016  . Rheumatoid arthritis ([place])   . Shortness of breath dyspnea    with exertion   Past Surgical History:  Procedure Laterality Date  . ABDOMINAL HYSTERECTOMY    . ABDOMINAL SURGERY    . BALLOON DILATION N/A 12/12/2014   Procedure: BALLOON DILATION;  Surgeon: Inda Castle, MD;  Location: [facility];  Service: Endoscopy;  Laterality: N/A;  . CHOLECYSTECTOMY    . COLONOSCOPY N/A 02/07/2016   Procedure: COLONOSCOPY;  Surgeon: Doran Stabler, MD;  Location: [facility] END[facility]COPY;  Service: Endoscopy;  Laterality: N/A;  . DILATION AND CURETTAGE OF UTERUS    . ESOPHAGOGASTRODUODENOSCOPY N/A 12/12/2014   Procedure: ESOPHAGOGASTRODUODENOSCOPY (EGD);  Surgeon: Inda Castle, MD;  Location: [facility];  Service: Endoscopy;  Laterality: N/A;  with dilation  . ESOPHAGOGASTRODUODENOSCOPY (EGD) WITH PROPOFOL N/A 10/09/2014   Procedure: ESOPHAGOGASTRODUODENOSCOPY (EGD) WITH PROPOFOL;  Surgeon: Inda Castle, MD;  Location: [facility];  Service: Endoscopy;  Laterality: N/A;  . ESOPHAGOGASTRODUODENOSCOPY  (EGD) WITH PROPOFOL N/A 09/19/2016   Procedure: ESOPHAGOGASTRODUODENOSCOPY (EGD) WITH PROPOFOL;  Surgeon: Doran Stabler, MD;  Location: [facility];  Service: Gastroenterology;  Laterality: N/A;  with savary dil tt  . FRACTURE SURGERY    . ORIF ANKLE FRACTURE Right 07/04/2015   Procedure: OPEN REDUCTION INTERNAL FIXATION (ORIF) ANKLE FRACTURE;  Surgeon: Newt Minion, MD;  Location: [facility] [facility];  Service: Orthopedics;  Laterality: Right;  OPEN REDUCTION, INTERNAL FIXATION OF RIGHT ANKLE FRACTURE.   [name] ORIF ANKLE FRACTURE Left 02/10/2016   Procedure: OPEN REDUCTION INTERNAL FIXATION (ORIF) ANKLE FRACTURE;  Surgeon: Newt Minion, MD;  Location: [place];  Service: Orthopedics;  Laterality: Left;  . SAVORY DILATION N/A 10/09/2014   Procedure: SAVORY DILATION;  Surgeon: Inda Castle, MD;  Location: [facility];  Service: Endoscopy;  Laterality: N/A;  . SAVORY DILATION N/A 09/19/2016   Procedure: SAVORY DILATION;  Surgeon: Doran Stabler, MD;  Location: [facility];  Service: Gastroenterology;  Laterality: N/A;  . TONSILLECTOMY     Social History   Social History  . Marital Status: Divorced    Spouse Name: N/A  . Number of Children: 0   Occupational History  . Retired Pharmacist, hospital Other   Social History Main Topics  . Smoking status: Former Smoker -- 3.00 packs/day for 45 years    Types: Cigarettes    Quit date: 12/05/1997  . Smokeless tobacco: Never Used  . Alcohol Use: No  . Drug Use: No   Social History Narrative   Patient lives at home alone.   Caffeine Use: 2 cups daily   Current Outpatient Medications on File Prior to Visit  Medication Sig Dispense Refill  . acetaminophen (TYLENOL) 325 MG tablet Take 2 tablets (650 mg total) by mouth every 6 (six) hours as needed for mild pain (or Fever >/= 101).    [name] albuterol (PROVENTIL HFA;VENTOLIN HFA) 108 (90 Base) MCG/ACT inhaler Inhale 2 puffs into the lungs every 6 (six) hours as needed for wheezing or shortness of breath. 1 Inhaler 2  .  betamethasone dipropionate (DIPROLENE) 0.05 % cream Apply topically 2 (two) times daily. 45 g 0  . budesonide-formoterol (SYMBICORT) 80-4.5 MCG/ACT inhaler Inhale 2 puffs into the lungs 2 (two) times daily. (Patient taking differently: Inhale 2 puffs into the lungs daily as needed (Wheezing). ) 1 Inhaler 6  . DULoxetine (CYMBALTA) 60 MG capsule Take 1 capsule (60 mg total) by mouth 2 (two) times daily. 180 capsule 4  . famotidine (PEPCID) 20 MG tablet Take 1 tablet (20 mg total) by mouth 2 (two) times daily. 60 tablet 5  . gabapentin (NEURONTIN) 300 MG capsule Take 1 capsule (300 mg total) by mouth 3 (three) times daily. 270 capsule 4  . glucagon (GLUCAGON EMERGENCY) 1 MG injection Inject 1 mg into the muscle once as needed for up to 1 dose. 1 each 12  . HUMALOG KWIKPEN 100 UNIT/ML KwikPen INJECT 15 TO 26 UNITS UNDER THE SKIN THREE TIMES DAILY 45 mL 2  . Insulin Detemir (LEVEMIR FLEXTOUCH) 100 UNIT/ML Pen Inject 7 units in the morning, and 27 units in the pm 45 mL 3  . Insulin Pen Needle (B-D UF III MINI PEN NEEDLES) 31G X 5 MM MISC USE TO CHECK BLOOD SUGAR FIVE TIMES DAILY 100 each 4  . cephALEXin (KEFLEX) 250 MG capsule Take by mouth 4 (four) times daily.    [name] sulfamethoxazole-trimethoprim (BACTRIM DS,SEPTRA DS) 800-160 MG tablet Take 1 tablet by mouth 2 (two) times daily.     No current facility-administered medications on file prior to visit.    Allergies  Allergen Reactions  . Carboplatin Other (See Comments)    Neuropathy  . Remicade [Infliximab] Anaphylaxis  . Hydromorphone Rash   Family History  Problem Relation Age of Onset  . Other Mother   . Other Father        failure to thrive  . Hypertension Other   . Stroke Other   . Heart attack Other   . Hemachromatosis Other   . Rheum arthritis Unknown        both sets of grandparents and father   PE: There were no vitals taken for this visit. Wt Readings from Last 3 Encounters:  11/02/18 175 lb (79.4 kg)  10/18/18 184 lb (83.5  kg)  10/17/18 188 lb 12.8 oz (85.6 kg)   Constitutional:  in NAD  The physical exam was not performed (telephone visit).  ASSESSMENT: 1. LADA, insulin-dependent, uncontrolled,  with complications - DKA in the setting of dehydration (- PN) - this may be 2/2 ChTx.  Pulmonologist: Dr. Chase Caller Oncologist: Dr. Earlie Server Cardiologist: Dr. Sallyanne Kuster Ophthalmologist: Dr. Clifton James  2.  Peripheral neuropathy -Possibly from Diabetes  PLAN:  1. Patient with longstanding, poorly controlled LADA, with very fluctuating CBGs, on basal-bolus insulin regimen.  At last visit, sugars were higher after she had pneumonia and we adjusted her insulin doses (decreased Humalog) slightly.  She came emergently at that time after she had a low blood sugar in the 40s.  We also discussed at length about how to manage low blood sugars and I also sent a prescription for glucagon to her pharmacy. -Since last visit, she was started on a Dexcom CGM and she brought the CGM to the office for download yesterday -At this visit, we reviewed together the CGM traces and identified the following patterns:  She continues to wake up with high blood sugars  Sugars drop down to the 70s before lunch  After this, sugars increase significantly close to 300 after lunch and remain high after dinner, after which they dropped again, but to 180s -Reviewing the downloads, she does not appear to have any more significantly low blood sugars, lower than 54, and she only has occasional sugars between 54 to 70 (1.6%).  44.8% of the time she is in target range of 70-1 80.  However, she does have high blood sugars >180 53.6% of the time and >250 21.8% of the time.  Also, she is not keeping the CGM on enough, only 30.8% of the time.  I advised her to try to keep it on all the time. - At this visit, we went over the doses of insulin that she is taking with each of her meals and she is taking a much higher dose than recommended with breakfast and  dinner and subsequently she is dropping her sugars after these meals.  I recommended to decrease her insulin with breakfast and dinner and increasing the dose with lunch.  I also think that she would benefit from a higher dose of Levemir at bedtime, but in the meantime, I advised her to stop the lower dose of Levemir in the morning. - I suggested to:  Patient Instructions  Please change: - Levemir 30-32 at bedtime - Humalog mealtime dose  - breakfast and dinner   - smaller meal: 15 units  - larger meal: 20 units  - lunch: 25 units  Please do not skip the insulin if sugars are 70-100.   If the sugars are 60-70, take 1/2 of the Humalog dose.  If the sugars are <60, skip insulin with that meal  No Humalog at bedtime, unless sugars >300: take 5 units or >400: 8 units   Please return in 3 months with your sugar log.   - We will check her HbA1c when she returns to the clinic - continue checking sugars at different times of the day - check 4x a day with CGM - advised for yearly eye exams >> she is UTD - Return to clinic in 3 mo with sugar log   2. PN  -Stable, no pain, only numbness -She was previously on Lyrica, now off  - time spent with the patient: 15 min, of which >50% was spent in obtaining information about her symptoms, reviewing her previous labs, CGM downloads, evaluations, and treatments, counseling her about her condition (please see the discussed topics above), and developing a plan to further investigate and treat it;  she had a number of questions which I addressed.  Philemon Kingdom, MD PhD [facility] Endocrinology

## 2019-02-07 NOTE — Patient Instructions (Addendum)
Please change: - Levemir 30-32 at bedtime - Humalog mealtime dose  - breakfast and dinner   - smaller meal: 15 units  - larger meal: 20 units  - lunch: 25 units  Please do not skip the insulin if sugars are 70-100.   If the sugars are 60-70, take 1/2 of the Humalog dose.  If the sugars are <60, skip insulin with that meal  No Humalog at bedtime, unless sugars >300: take 5 units or >400: 8 units   Please return in 3 months with your sugar log.

## 2019-02-15 ENCOUNTER — Other Ambulatory Visit: Payer: Self-pay | Admitting: Internal Medicine

## 2019-02-15 NOTE — Telephone Encounter (Signed)
Amlodipine is not listed on active meds. 11/02/18 OV note states she may be taking Amlodipine. I called pt- she states she is taking Amlodipine 10 mg 1 qd.

## 2019-05-07 ENCOUNTER — Other Ambulatory Visit: Payer: Self-pay

## 2019-05-07 ENCOUNTER — Encounter: Payer: Medicare Other | Attending: Internal Medicine | Admitting: Nutrition

## 2019-05-07 DIAGNOSIS — Z794 Long term (current) use of insulin: Secondary | ICD-10-CM | POA: Diagnosis present

## 2019-05-07 DIAGNOSIS — E119 Type 2 diabetes mellitus without complications: Secondary | ICD-10-CM | POA: Insufficient documentation

## 2019-05-09 NOTE — Progress Notes (Signed)
Subjective:    Patient ID: Megan [name], female    DOB: Feb 10, 1941, 78 y.o.   MRN: [id number]  HPI The patient is here for follow up.  Diabetes: she follows with Dr Cruzita Lederer.  She is taking her medication daily as prescribed. She is somewhat compliant with a diabetic diet.    Hypertension: She is taking her medication daily. She is compliant with a low sodium diet.  She denies chest pain, palpitations, edema, shortness of breath and regular headaches.    H/o lung cancer, chronic SOB and dry cough:  She is very sedentary so she does not get too SOB, but she will with exertion.  She has a dry cough that is chronic and unchanged.  She denies wheezing.    GERD:  It occurs when she gets in bed and goes to sleep.  She has rare GERD during the day.   She is not taking anything for GERD. She eats late at night.    Headaches:  She has little headaches that started months ago.  She has pain that feels dart like pain that lasts 5 secs and it occurs in different areas all over her head.  It occurs daily.  She does not want to take anything for this because it is tolerable.     Medications and allergies reviewed with patient and updated if appropriate.  Patient Active Problem List   Diagnosis Date Noted  . GERD (gastroesophageal reflux disease) 10/17/2018  . CAP (community acquired pneumonia) 10/15/2018  . Post herpetic neuralgia 03/30/2018  . Herpes zoster without complication 56/31/4970  . Right elbow pain 03/01/2018  . Carpal tunnel syndrome on right 11/30/2017  . Cough 08/22/2017  . Hypokalemia 03/22/2017  . Primary osteoarthritis of both hands 03/08/2017  . Poor balance 02/07/2017  . Subarachnoid bleed ([place]) 01/11/2017  . Psoriatic arthropathy ([place]) 09/08/2016  . Hypertension 09/08/2016  . Diabetic neuropathy ([place]) 09/08/2016  . LADA (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), managed as type 1 ([place]) 05/04/2016  . Syncope 03/25/2016  . Malnutrition of moderate degree 02/29/2016   . Diabetic ketoacidosis with coma associated with diabetes mellitus due to underlying condition ([place])   . RLS (restless legs syndrome) 12/11/2015  . Lumbago 05/06/2015  . Peripheral edema 04/14/2015  . Psoriasis 04/14/2015  . Emphysema of lung ([place]) 11/26/2014  . Radiation-induced esophageal stricture 10/01/2014  . Tachycardia 08/27/2014  . DNR (do not resuscitate) discussion 08/21/2014  . Cancer associated pain 07/29/2014  . Malignant cachexia ([place]) 07/29/2014  . Fatigue 07/18/2014  . Neuropathy due to chemotherapeutic drug ([place]) 07/18/2014  . Leg cramps 07/18/2014  . Thrombocytopenia, unspecified ([place]) 07/18/2014  . Dyspnea 04/28/2014  . Radiation esophagitis 04/10/2014  . Right-sided chest wall pain 04/01/2014  . Cancer of middle lobe of lung ([place]) 01/17/2014  . Malignant neoplasm of right upper lobe of lung ([place]) 11/28/2013    Current Outpatient Medications on File Prior to Visit  Medication Sig Dispense Refill  . albuterol (PROVENTIL HFA;VENTOLIN HFA) 108 (90 Base) MCG/ACT inhaler Inhale 2 puffs into the lungs every 6 (six) hours as needed for wheezing or shortness of breath. 1 Inhaler 2  . amLODipine (NORVASC) 10 MG tablet TAKE 1 TABLET(10 MG) BY MOUTH DAILY 90 tablet 1  . betamethasone dipropionate (DIPROLENE) 0.05 % cream Apply topically 2 (two) times daily. 45 g 0  . budesonide-formoterol (SYMBICORT) 80-4.5 MCG/ACT inhaler Inhale 2 puffs into the lungs 2 (two) times daily. (Patient taking differently: Inhale 2 puffs into the lungs daily  as needed (Wheezing). ) 1 Inhaler 6  . DULoxetine (CYMBALTA) 60 MG capsule Take 1 capsule (60 mg total) by mouth 2 (two) times daily. 180 capsule 4  . gabapentin (NEURONTIN) 300 MG capsule Take 1 capsule (300 mg total) by mouth 3 (three) times daily. 270 capsule 4  . HUMALOG KWIKPEN 100 UNIT/ML KwikPen INJECT 15 TO 26 UNITS UNDER THE SKIN THREE TIMES DAILY 45 mL 2  . Insulin Detemir (LEVEMIR FLEXTOUCH) 100 UNIT/ML Pen Inject 7 units in the  morning, and 27 units in the pm 45 mL 3  . Insulin Pen Needle (B-D UF III MINI PEN NEEDLES) 31G X 5 MM MISC USE TO CHECK BLOOD SUGAR FIVE TIMES DAILY 100 each 4   No current facility-administered medications on file prior to visit.     Past Medical History:  Diagnosis Date  . Cancer ([place]) dx'd 09/2013   Lung ca  . Concussion 2018  . Diabetes ([place]) 09/08/2016   type I diabetic patient reported on 09/13/16   . Diabetes mellitus    insulin  . Diabetic neuropathy ([place]) 09/08/2016  . Fall 2018  . GERD (gastroesophageal reflux disease)    no meds for  . History of colitis   . History of hiatal hernia   . Hx of radiation therapy 12/16/13-01/30/14   lung 66Gy  . Hypertension   . Malignant neoplasm of right upper lobe of lung ([place]) 11/28/2013  . Neuropathy   . Psoriasis   . Psoriatic arthritis ([place]) 09/08/2016  . Rheumatoid arthritis ([place])   . Shortness of breath dyspnea    with exertion    Past Surgical History:  Procedure Laterality Date  . ABDOMINAL HYSTERECTOMY    . ABDOMINAL SURGERY    . BALLOON DILATION N/A 12/12/2014   Procedure: BALLOON DILATION;  Surgeon: Inda Castle, MD;  Location: [facility];  Service: Endoscopy;  Laterality: N/A;  . CHOLECYSTECTOMY    . COLONOSCOPY N/A 02/07/2016   Procedure: COLONOSCOPY;  Surgeon: Doran Stabler, MD;  Location: [facility] ENDOSCOPY;  Service: Endoscopy;  Laterality: N/A;  . DILATION AND CURETTAGE OF UTERUS    . ESOPHAGOGASTRODUODENOSCOPY N/A 12/12/2014   Procedure: ESOPHAGOGASTRODUODENOSCOPY (EGD);  Surgeon: Inda Castle, MD;  Location: [facility];  Service: Endoscopy;  Laterality: N/A;  with dilation  . ESOPHAGOGASTRODUODENOSCOPY (EGD) WITH PROPOFOL N/A 10/09/2014   Procedure: ESOPHAGOGASTRODUODENOSCOPY (EGD) WITH PROPOFOL;  Surgeon: Inda Castle, MD;  Location: [facility];  Service: Endoscopy;  Laterality: N/A;  . ESOPHAGOGASTRODUODENOSCOPY (EGD) WITH PROPOFOL N/A 09/19/2016   Procedure: ESOPHAGOGASTRODUODENOSCOPY (EGD) WITH  PROPOFOL;  Surgeon: Doran Stabler, MD;  Location: [facility] ENDOSCOPY;  Service: Gastroenterology;  Laterality: N/A;  with savary dil tt  . FRACTURE SURGERY    . ORIF ANKLE FRACTURE Right 07/04/2015   Procedure: OPEN REDUCTION INTERNAL FIXATION (ORIF) ANKLE FRACTURE;  Surgeon: Newt Minion, MD;  Location: [facility];  Service: Orthopedics;  Laterality: Right;  OPEN REDUCTION, INTERNAL FIXATION OF RIGHT ANKLE FRACTURE.   [name] ORIF ANKLE FRACTURE Left 02/10/2016   Procedure: OPEN REDUCTION INTERNAL FIXATION (ORIF) ANKLE FRACTURE;  Surgeon: Newt Minion, MD;  Location: [facility];  Service: Orthopedics;  Laterality: Left;  . SAVORY DILATION N/A 10/09/2014   Procedure: SAVORY DILATION;  Surgeon: Inda Castle, MD;  Location: [facility];  Service: Endoscopy;  Laterality: N/A;  . SAVORY DILATION N/A 09/19/2016   Procedure: SAVORY DILATION;  Surgeon: Doran Stabler, MD;  Location: [facility] ENDOSCOPY;  Service: Gastroenterology;  Laterality: N/A;  .  TONSILLECTOMY      Social History   Socioeconomic History  . Marital status: Divorced    Spouse name: Not on file  . Number of children: 0  . Years of education: MA  . Highest education level: Master's degree (e.g., MA, MS, MEng, MEd, MSW, MBA)  Occupational History  . Occupation: Retired    Fish farm manager: OTHER  Social Needs  . Financial resource strain: Not hard at all  . Food insecurity    Worry: Never true    Inability: Never true  . Transportation needs    Medical: No    Non-medical: No  Tobacco Use  . Smoking status: Former Smoker    Packs/day: 3.00    Years: 45.00    Pack years: 135.00    Types: Cigarettes    Quit date: 12/05/1997    Years since quitting: 21.4  . Smokeless tobacco: Never Used  Substance and Sexual Activity  . Alcohol use: No    Alcohol/week: 0.0 standard drinks    Comment: rarely   . Drug use: No  . Sexual activity: Not Currently  Lifestyle  . Physical activity    Days per week: 0 days    Minutes per session: 0 min  . Stress:  Not at all  Relationships  . Social connections    Talks on phone: More than three times a week    Gets together: More than three times a week    Attends religious service: More than 4 times per year    Active member of club or organization: Yes    Attends meetings of clubs or organizations: More than 4 times per year    Relationship status: Not on file  Other Topics Concern  . Not on file  Social History Narrative   Patient lives at home alone.   Caffeine Use: 1 cups daily   Right handed    Family History  Problem Relation Age of Onset  . Other Mother   . Other Father        failure to thrive  . Hypertension Other   . Stroke Other   . Heart attack Other   . Hemachromatosis Other   . Rheum arthritis Unknown        both sets of grandparents and father    Review of Systems  Constitutional: Negative for chills and fever.  Respiratory: Positive for cough and shortness of breath. Negative for wheezing.   Cardiovascular: Positive for leg swelling. Negative for chest pain and palpitations.  Neurological: Negative for dizziness, light-headedness and headaches (head pain).       Objective:   Vitals:   05/10/19 1402  BP: 118/68  Pulse: (!) 107  Resp: 16  Temp: 98.9 F (37.2 C)  SpO2: 95%   BP Readings from Last 3 Encounters:  05/10/19 118/68  11/02/18 120/82  10/18/18 102/64   Wt Readings from Last 3 Encounters:  05/10/19 173 lb 12.8 oz (78.8 kg)  11/02/18 175 lb (79.4 kg)  10/18/18 184 lb (83.5 kg)   Body mass index is 28.05 kg/m.   Physical Exam    Constitutional: Appears well-developed and well-nourished. No distress.  HENT:  Head: Normocephalic and atraumatic.  Neck: Neck supple. No tracheal deviation present. No thyromegaly present.  No cervical lymphadenopathy Cardiovascular: Normal rate, regular rhythm and normal heart sounds.  No murmur heard. No carotid bruit .  1+ b/l anke edema.  B/l varicosities at ankles  L>R Pulmonary/Chest: Effort normal and  breath sounds normal. No respiratory  distress. No has no wheezes. No rales.  Skin: Skin is warm and dry. Not diaphoretic.  Psychiatric: Normal mood and affect. Behavior is normal.    Diabetic Foot Exam - Simple   Simple Foot Form Diabetic Foot exam was performed with the following findings: Yes 05/10/2019  2:42 PM  Visual Inspection No deformities, no ulcerations, no other skin breakdown bilaterally: Yes See comments: Yes Sensation Testing See comments: Yes Pulse Check Posterior Tibialis and Dorsalis pulse intact bilaterally: Yes Comments B/l bunions.  Decreased sensation b/l plantar surfaces of feet      Assessment & Plan:    See Problem List for Assessment and Plan of chronic medical problems.

## 2019-05-10 ENCOUNTER — Other Ambulatory Visit: Payer: Self-pay

## 2019-05-10 ENCOUNTER — Other Ambulatory Visit (INDEPENDENT_AMBULATORY_CARE_PROVIDER_SITE_OTHER): Payer: Medicare Other

## 2019-05-10 ENCOUNTER — Encounter: Payer: Self-pay | Admitting: Internal Medicine

## 2019-05-10 ENCOUNTER — Ambulatory Visit (INDEPENDENT_AMBULATORY_CARE_PROVIDER_SITE_OTHER): Payer: Medicare Other | Admitting: Internal Medicine

## 2019-05-10 VITALS — BP 118/68 | HR 107 | Temp 98.9°F | Resp 16 | Ht 66.0 in | Wt 173.8 lb

## 2019-05-10 DIAGNOSIS — E139 Other specified diabetes mellitus without complications: Secondary | ICD-10-CM | POA: Diagnosis not present

## 2019-05-10 DIAGNOSIS — I10 Essential (primary) hypertension: Secondary | ICD-10-CM | POA: Diagnosis not present

## 2019-05-10 DIAGNOSIS — K219 Gastro-esophageal reflux disease without esophagitis: Secondary | ICD-10-CM | POA: Diagnosis not present

## 2019-05-10 DIAGNOSIS — R519 Headache, unspecified: Secondary | ICD-10-CM | POA: Insufficient documentation

## 2019-05-10 DIAGNOSIS — R51 Headache: Secondary | ICD-10-CM

## 2019-05-10 DIAGNOSIS — Z13820 Encounter for screening for osteoporosis: Secondary | ICD-10-CM

## 2019-05-10 DIAGNOSIS — Z23 Encounter for immunization: Secondary | ICD-10-CM | POA: Diagnosis not present

## 2019-05-10 LAB — CBC WITH DIFFERENTIAL/PLATELET
Basophils Absolute: 0.1 10*3/uL (ref 0.0–0.1)
Basophils Relative: 1.3 % (ref 0.0–3.0)
Eosinophils Absolute: 0.3 10*3/uL (ref 0.0–0.7)
Eosinophils Relative: 3 % (ref 0.0–5.0)
HCT: 43.1 % (ref 36.0–46.0)
Hemoglobin: 14 g/dL (ref 12.0–15.0)
Lymphocytes Relative: 21.6 % (ref 12.0–46.0)
Lymphs Abs: 2.1 10*3/uL (ref 0.7–4.0)
MCHC: 32.6 g/dL (ref 30.0–36.0)
MCV: 87.3 fl (ref 78.0–100.0)
Monocytes Absolute: 0.6 10*3/uL (ref 0.1–1.0)
Monocytes Relative: 6 % (ref 3.0–12.0)
Neutro Abs: 6.8 10*3/uL (ref 1.4–7.7)
Neutrophils Relative %: 68.1 % (ref 43.0–77.0)
Platelets: 240 10*3/uL (ref 150.0–400.0)
RBC: 4.94 Mil/uL (ref 3.87–5.11)
RDW: 16.2 % — ABNORMAL HIGH (ref 11.5–15.5)
WBC: 9.9 10*3/uL (ref 4.0–10.5)

## 2019-05-10 LAB — COMPREHENSIVE METABOLIC PANEL
ALT: 12 U/L (ref 0–35)
AST: 11 U/L (ref 0–37)
Albumin: 4.2 g/dL (ref 3.5–5.2)
Alkaline Phosphatase: 134 U/L — ABNORMAL HIGH (ref 39–117)
BUN: 20 mg/dL (ref 6–23)
CO2: 32 mEq/L (ref 19–32)
Calcium: 8.9 mg/dL (ref 8.4–10.5)
Chloride: 98 mEq/L (ref 96–112)
Creatinine, Ser: 1.18 mg/dL (ref 0.40–1.20)
GFR: 44.37 mL/min — ABNORMAL LOW (ref >60.00)
Glucose, Bld: 191 mg/dL — ABNORMAL HIGH (ref 70–99)
Potassium: 3.5 mEq/L (ref 3.5–5.1)
Sodium: 139 mEq/L (ref 135–145)
Total Bilirubin: 0.4 mg/dL (ref 0.2–1.2)
Total Protein: 6.8 g/dL (ref 6.0–8.3)

## 2019-05-10 LAB — LIPID PANEL
Cholesterol: 216 mg/dL — ABNORMAL HIGH (ref 0–200)
HDL: 48.7 mg/dL (ref >39.00)
NonHDL: 167.24
Total CHOL/HDL Ratio: 4
Triglycerides: 229 mg/dL — ABNORMAL HIGH (ref 0.0–149.0)
VLDL: 45.8 mg/dL — ABNORMAL HIGH (ref 0.0–40.0)

## 2019-05-10 LAB — HEMOGLOBIN A1C: Hgb A1c MFr Bld: 11.3 % — ABNORMAL HIGH (ref 4.6–6.5)

## 2019-05-10 LAB — LDL CHOLESTEROL, DIRECT: Direct LDL: 156 mg/dL

## 2019-05-10 MED ORDER — FAMOTIDINE 40 MG PO TABS: 40.0000 mg | ORAL_TABLET | Freq: Every day | ORAL | 1 refills | Status: DC

## 2019-05-10 NOTE — Assessment & Plan Note (Signed)
Likely nerve pain - throughout head - lasts for 5 sec Does not want treatment monitor

## 2019-05-10 NOTE — Assessment & Plan Note (Addendum)
Having gerd daily Advised to eat earlier - not to lay down w/in three hours of eating GERD diet Start pepcid 40 mg daily

## 2019-05-10 NOTE — Assessment & Plan Note (Signed)
Management per Dr Cruzita Lederer A1c, lipid

## 2019-05-10 NOTE — Assessment & Plan Note (Signed)
BP well controlled Current regimen effective and well tolerated Continue current medications at current doses cmp  

## 2019-05-10 NOTE — Patient Instructions (Signed)
  Tests ordered today. Your results will be released to [facility] (or called to you) after review.  If any changes need to be made, you will be notified at that same time.   Pneumovax immunization administered today.   Medications reviewed and updated.  Changes include :   Start pepcid daily in the evening for her heartburn.   Your prescription(s) have been submitted to your pharmacy. Please take as directed and contact our office if you believe you are having problem(s) with the medication(s).   Please followup in 6 months

## 2019-05-12 ENCOUNTER — Encounter: Payer: Self-pay | Admitting: Internal Medicine

## 2019-05-13 NOTE — Progress Notes (Signed)
Patient requested another sesson how to insert the dexcom.  She had not been doing this for several weeks and had forgotten how to do this.  We reset the time on her reader, and recharged it.  A new sensor was put in and the 2 hour warm up period was started.   WE reviewed the steps on how to insert the sensor again, and she was given the sheet with the steps on it.   We reviewed what to do in 10 days and how to save the transmitter, and she reported good understanding of this.

## 2019-05-13 NOTE — Patient Instructions (Signed)
REad over sheet with steps to follow for inserting a new transmitter.   In 10 days, remove the old sensor and save the transmitter.

## 2019-05-31 ENCOUNTER — Other Ambulatory Visit: Payer: Self-pay | Admitting: Internal Medicine

## 2019-05-31 ENCOUNTER — Other Ambulatory Visit: Payer: Self-pay

## 2019-06-02 ENCOUNTER — Other Ambulatory Visit: Payer: Self-pay | Admitting: Internal Medicine

## 2019-06-03 ENCOUNTER — Telehealth: Payer: Self-pay | Admitting: Neurology

## 2019-06-03 NOTE — Telephone Encounter (Signed)
Megan [name], can you do me a favor? I love this patient. Looks like she wil have a new consult. Would you call and get her on my schedule this week or next week? She can go in any open spot, even any botox spot. If I don't have anything then I can make one, let m e now what you can do with my schedule and let patient kow I qill squeeze her in anytime she is darling! thank you !!!

## 2019-06-03 NOTE — Telephone Encounter (Signed)
I spoke with the patient and advised that she would need to see her pcp for this new issue of headaches and then have a referral sent here if needed as Dr. Jaynee Eagles has never seen her for this. She stated she had seen her PCP. She stated she would call them. I advised our referrals team would then call to schedule an appt when they receive the referral. She verbalized appreciation.

## 2019-06-03 NOTE — Telephone Encounter (Signed)
Pt has called asking for an appointment to see Dr Jaynee Eagles, she was offered the 1st available along with the wait list.  Pt declined and said she would just prefer a call from Dr Jaynee Eagles to discuss her headaches.  Please call

## 2019-06-04 ENCOUNTER — Telehealth: Payer: Self-pay

## 2019-06-04 DIAGNOSIS — R519 Headache, unspecified: Secondary | ICD-10-CM

## 2019-06-04 NOTE — Telephone Encounter (Signed)
Yes that's fine she is long-time patient I'm sure the referral will come thanks

## 2019-06-04 NOTE — Telephone Encounter (Signed)
We haven't received a referral on patient for the headaches as of yet, do you want me to go ahead and schedule her without one?

## 2019-06-04 NOTE — Telephone Encounter (Signed)
Pt aware.

## 2019-06-04 NOTE — Telephone Encounter (Signed)
Copied from [place] [id number]. Topic: Quick Communication - See Telephone Encounter >> Jun 03, 2019  4:16 PM [place] wrote: CRM for notification. See Telephone encounter for: 06/03/19. Pt had appt on 7/10 for a fu/ She wants a neruo referral and wants to know since her headaches were discussed could you give her the referral?

## 2019-06-04 NOTE — Telephone Encounter (Signed)
Pt has called asking to speak with Lexine Baton re: her referral

## 2019-06-04 NOTE — Telephone Encounter (Signed)
Neuro referral ordered

## 2019-06-10 ENCOUNTER — Telehealth: Payer: Self-pay | Admitting: *Deleted

## 2019-06-10 ENCOUNTER — Encounter: Payer: Self-pay | Admitting: Neurology

## 2019-06-10 ENCOUNTER — Institutional Professional Consult (permissible substitution): Payer: Medicare Other | Admitting: Neurology

## 2019-06-10 NOTE — Telephone Encounter (Signed)
Pt no showed appt today

## 2019-07-10 ENCOUNTER — Telehealth: Payer: Self-pay | Admitting: Neurology

## 2019-07-10 ENCOUNTER — Other Ambulatory Visit: Payer: Self-pay

## 2019-07-10 ENCOUNTER — Encounter: Payer: Self-pay | Admitting: Neurology

## 2019-07-10 ENCOUNTER — Ambulatory Visit: Payer: Medicare Other | Admitting: Neurology

## 2019-07-10 VITALS — BP 108/72 | HR 116 | Temp 98.4°F | Ht 66.0 in | Wt 171.0 lb

## 2019-07-10 DIAGNOSIS — R51 Headache: Secondary | ICD-10-CM | POA: Diagnosis not present

## 2019-07-10 DIAGNOSIS — G441 Vascular headache, not elsewhere classified: Secondary | ICD-10-CM | POA: Diagnosis not present

## 2019-07-10 DIAGNOSIS — R519 Headache, unspecified: Secondary | ICD-10-CM

## 2019-07-10 NOTE — Patient Instructions (Signed)
MRI brain Call Dermatologist

## 2019-07-10 NOTE — Telephone Encounter (Signed)
[employer] medicare order faxed to [facility] they will reach out to the patient to schedule.

## 2019-07-10 NOTE — Progress Notes (Signed)
[facility]    Provider:  Dr Jaynee Eagles Referring Provider: Binnie Rail, MD Primary Care Physician:  Binnie Rail, MD   HPI 07/10/2019: This is a wonderful 78 years old white female with history of stage IIIB non-small cell lung cancer status postcarboplatin and paclitaxel with resultant painful neuropathy.I've treated her in the past for neuropathy but here with new onset headaches. She has a flicker but no pain, last 30 secinds to a minute, they start at the temple ans shoots to the eye, sometimes on top, sometimes on the right, sometime in the back of the head, they are gentle, she has had 1-2 stabs but nothing significant, no hx of migraines in the family. Started in March right around the pandemic. No new medications. Nothing has changed. No inciting events. It can come and go for a few days. Eyes get very tired, no eye drooping, no lacrimation or rhinorrhea, her eyes can get very tired, she feels her eyes hurt. She is having symptoms every day multiple times a day.    CT 2019 showed No acute intracranial abnormalities including mass lesion or mass effect, hydrocephalus, extra-axial fluid collection, midline shift, hemorrhage, or acute infarction, large ischemic events (personally reviewed images)    HPI: This is a wonderful 78 years old white female with history of stage IIIB non-small cell lung cancer status postcarboplatin and paclitaxel with resultant painful neuropathy. She is still on the Lyrica. Her left hand feels "sandy", all the fingers, feels like sand paper, she feels weakness in her left hand and is dropping, she has APB muscle wasting. Feet are stable, numb, no significant pain. New issue is the left hand symptoms.  Interval history 10/26/2016: She is here for painful neuropathy. Her feet are numb. Not much pain in the feet anymore. She has pain in the hands. She can grip things and has some strength but the left hand is worse than the right. She has done well  on the medications but today would like to try Lyrica instead of long-acting gabapentin. She is on 1200-1859m of long-acting gabapentin so will start her at a higher dose of Lyrica 157mtwice daily. Discussed side effects.    Interval History 08/12/2015:She has fallen and broken several bones in the right leg. She in a cast currently. She had to go to skilled nursing for several weeks. Her legs just buckled and she fell. It was late at night, she got up to go to the bathroom before she went to sleep. She got lightheaded and she crashed onher ankle. She crawled over to the phone and called 911 and went to the emergency room. She was Dxed with low blood pressure. At a later incident, she was walking around the bed and just went down, no LOC, no SOB, no CP. Not lightheaded. Her legs just buckled, both of them, did not trip. No weakness, she can get out of low seats, she can walk up stairs, no new weakness. She was very active all week, no illnesses or inciting events. Howvere she has had diarrhea for 2 months and she is dehydrated. She was placed on prednisone yesterday, cdiff was negative. She was not taking marinol. She was no taking vicadin or any pain killer at the time. Cymbalta is helping. Her left hand is getting number. She is not in pain with the neuropathy, more numbness. Before she gets up, she puts her head down to help with blood pressure. They decreased her BP medication, then stopped it entirely.  Her glucose has been running very high, 8 days ago 542 and 1 day ago was 210 and she is following with her pcp.   Interval history 06/18/2015:4pm Tuesday feet started burning significantly, like on fire on the bootm. This was new. The left hand also started to burn. She has never had it before. The burning is better. It was very difficult to walk. She took ibuprofen and it helped. The burning went away in 20 minutes. The left more than the right. She had shoes on, no new shoes. She was reclining on  her bed watching TV. Excruciating on the left. Does not feel the Lamictal is helping with the neuropathy or with mood.  Interval History 05/07/2015: This is a wonderful 78 years old white female with history of stage IIIB non-small cell lung cancer status postcarboplatin and paclitaxel with resultant painful neuropathy.The feet are about the same. Still on the Gralise. Lidocaine patches help somewhat. She is tolerating the Lamictal, no rashes. Discussed several options for her neuropathy, decided to slowly increase Lamictal to 62m then 775mand then 10026mThis will hopefully help with her neuropathy and is a great medication for mood. Discussed at length. Ambien ishelping her sleep. Tried Lyrica before the Gralise and that did not work. The Gralise has worked the best so far.   Interval History 11/13/2014: She is feeling better. She is taking the marinol and can sleep at night. The pain is manageable during the day with the Gralise. She has a new diagnosis of diabetes. Her thyroid function is elevated. She takes daily B12. She is having some swelling in her legs which is manageable however it makes it difficult to drive. She still has tingling in the fingers.   Her doctors include  GreCarMaxDr MohHipolito Bayley. kohWilson Singernterval History 09/03/2014:She is still struggling with her peripheral neuropathy, nothing is helping. It is severe. Maybe the Gralise helped but then she ran out and didn't call for a prescription. She was recently given prednisone, cymbalta. Cymbalta made her symptoms worse. Started the Gralise and the 1800 mg worked. Belsomra didn't help. Ambien helps her sleep. Lyrica didn't work either for her peripheral neuropathy. Takes b12 daily and biotin. She is sleeping better with Ambien but the neuropathy is still severe. She can't fall asleep, possibly stress. Paresthesias are in the feet and hands. They occurred in the setting of platin-based  chemotherapy. They are continuous, worse at night when sleeping. She is also very emotional, crying a lot. Her appetite is very poor.  Visit 08/13/14:This is a very pleasant [age]ars old white female with history of stage IIIB non-small cell lung cancer status postcarboplatin and paclitaxel. Patient reports she [place]. 6 weeks later started a second set of chemo, 3 sessions every 3 weeks. The neuropathy started about 3 weeks ago. Feels like burning, pins and needles and lightning strikes. Feet and left hand >right hand. It is in the entire feet to the ankles and entire left hand and digits 3-5 right hand. Severe, cramping in the feet and calfs. She is crying in the office today. Oxycodone not helping with the pain, doesn't even touch it. Didn't try Lyrica. Cymbalta worsened the symptoms. Nothing is helping. She is using a cane. No falls. Balance if off. The pain is severe, 10/10 and keeps her from sleeping at night. Symptoms are continuous and never subside, but are worse at night.  Reviewed notes, labs and  imaging from outside physicians, which showed: 1) Concurrent chemoradiation with weekly chemotherapy in the form of carboplatin for AUC of 2 and paclitaxel 45 mg/m2. Status post 7 week of treatment. Last dose was given 01/27/2014 no significant response in her disease.  2) Consolidation chemotherapy with carboplatin for AUC of 5 and paclitaxel 175 mg/M2 every 3 weeks with Neulasta support. First dose on 04/15/2014. Status post 3 cycles. Carboplatin was discontinued starting cycle #2 secondary to hypersensitivity reaction.  Her restaging scan showed evidence for disease progression and the patient is currently undergoing immunotherapy with Nivolumab 3 mg/KG every 2 weeks is status post 3 cycle   Review of Systems: Patient complains of symptoms per HPI as well as the following symptoms: left hand numbness and tingling. Pertinent negatives and  positives per HPI. All others negative.   Social History   Socioeconomic History  . Marital status: Divorced    Spouse name: Not on file  . Number of children: 0  . Years of education: MA  . Highest education level: Master's degree (e.g., MA, MS, MEng, MEd, MSW, MBA)  Occupational History  . Occupation: Retired    Fish farm manager: OTHER  Social Needs  . Financial resource strain: Not hard at all  . Food insecurity    Worry: Never true    Inability: Never true  . Transportation needs    Medical: No    Non-medical: No  Tobacco Use  . Smoking status: Former Smoker    Packs/day: 3.00    Years: 45.00    Pack years: 135.00    Types: Cigarettes    Quit date: 12/05/1997    Years since quitting: 21.6  . Smokeless tobacco: Never Used  Substance and Sexual Activity  . Alcohol use: No    Alcohol/week: 0.0 standard drinks    Comment: rarely   . Drug use: No  . Sexual activity: Not Currently  Lifestyle  . Physical activity    Days per week: 0 days    Minutes per session: 0 min  . Stress: Not at all  Relationships  . Social connections    Talks on phone: More than three times a week    Gets together: More than three times a week    Attends religious service: More than 4 times per year    Active member of club or organization: Yes    Attends meetings of clubs or organizations: More than 4 times per year    Relationship status: Not on file  . Intimate partner violence    Fear of current or ex partner: Not on file    Emotionally abused: Not on file    Physically abused: Not on file    Forced sexual activity: Not on file  Other Topics Concern  . Not on file  Social History Narrative   Patient lives at home alone.   Caffeine Use: 1 cups daily   Right handed    Family History  Problem Relation Age of Onset  . Other Mother   . Anuerysm Mother   . Other Father        failure to thrive  . Hypertension Other   . Stroke Other   . Heart attack Other   . Hemachromatosis Other   .  Rheum arthritis Other        both sets of grandparents and father  . Migraines Neg Hx     Past Medical History:  Diagnosis Date  . Cancer ([place]) dx'd 09/2013   Lung ca  .  Concussion 2018  . Diabetes ([place]) 09/08/2016   type I diabetic patient reported on 09/13/16   . Diabetes mellitus    insulin  . Diabetic neuropathy ([place]) 09/08/2016  . Fall 2018  . GERD (gastroesophageal reflux disease)    no meds for  . History of colitis   . History of hiatal hernia   . Hx of radiation therapy 12/16/13-01/30/14   lung 66Gy  . Hypertension   . Malignant neoplasm of right upper lobe of lung ([place]) 11/28/2013  . Neuropathy   . Psoriasis   . Psoriatic arthritis ([place]) 09/08/2016  . Rheumatoid arthritis ([place])   . Shortness of breath dyspnea    with exertion    Past Surgical History:  Procedure Laterality Date  . ABDOMINAL HYSTERECTOMY    . ABDOMINAL SURGERY    . BALLOON DILATION N/A 12/12/2014   Procedure: BALLOON DILATION;  Surgeon: Inda Castle, MD;  Location: [facility];  Service: Endoscopy;  Laterality: N/A;  . CHOLECYSTECTOMY    . COLONOSCOPY N/A 02/07/2016   Procedure: COLONOSCOPY;  Surgeon: Doran Stabler, MD;  Location: [facility];  Service: Endoscopy;  Laterality: N/A;  . DILATION AND CURETTAGE OF UTERUS    . ESOPHAGOGASTRODUODENOSCOPY N/A 12/12/2014   Procedure: ESOPHAGOGASTRODUODENOSCOPY (EGD);  Surgeon: Inda Castle, MD;  Location: [facility];  Service: Endoscopy;  Laterality: N/A;  with dilation  . ESOPHAGOGASTRODUODENOSCOPY (EGD) WITH PROPOFOL N/A 10/09/2014   Procedure: ESOPHAGOGASTRODUODENOSCOPY (EGD) WITH PROPOFOL;  Surgeon: Inda Castle, MD;  Location: [facility];  Service: Endoscopy;  Laterality: N/A;  . ESOPHAGOGASTRODUODENOSCOPY (EGD) WITH PROPOFOL N/A 09/19/2016   Procedure: ESOPHAGOGASTRODUODENOSCOPY (EGD) WITH PROPOFOL;  Surgeon: Doran Stabler, MD;  Location: [facility];  Service: Gastroenterology;  Laterality: N/A;  with savary dil tt  . FRACTURE  SURGERY    . ORIF ANKLE FRACTURE Right 07/04/2015   Procedure: OPEN REDUCTION INTERNAL FIXATION (ORIF) ANKLE FRACTURE;  Surgeon: Newt Minion, MD;  Location: [facility];  Service: Orthopedics;  Laterality: Right;  OPEN REDUCTION, INTERNAL FIXATION OF RIGHT ANKLE FRACTURE.   [name] ORIF ANKLE FRACTURE Left 02/10/2016   Procedure: OPEN REDUCTION INTERNAL FIXATION (ORIF) ANKLE FRACTURE;  Surgeon: Newt Minion, MD;  Location: [facility];  Service: Orthopedics;  Laterality: Left;  . SAVORY DILATION N/A 10/09/2014   Procedure: SAVORY DILATION;  Surgeon: Inda Castle, MD;  Location: [facility];  Service: Endoscopy;  Laterality: N/A;  . SAVORY DILATION N/A 09/19/2016   Procedure: SAVORY DILATION;  Surgeon: Doran Stabler, MD;  Location: [facility];  Service: Gastroenterology;  Laterality: N/A;  . TONSILLECTOMY      Current Outpatient Medications  Medication Sig Dispense Refill  . albuterol (PROVENTIL HFA;VENTOLIN HFA) 108 (90 Base) MCG/ACT inhaler Inhale 2 puffs into the lungs every 6 (six) hours as needed for wheezing or shortness of breath. 1 Inhaler 2  . amLODipine (NORVASC) 10 MG tablet TAKE 1 TABLET(10 MG) BY MOUTH DAILY 90 tablet 1  . betamethasone dipropionate (DIPROLENE) 0.05 % cream Apply topically 2 (two) times daily. 45 g 0  . budesonide-formoterol (SYMBICORT) 80-4.5 MCG/ACT inhaler Inhale 2 puffs into the lungs 2 (two) times daily. (Patient taking differently: Inhale 2 puffs into the lungs daily as needed (Wheezing). ) 1 Inhaler 6  . Cyanocobalamin (VITAMIN B-12 PO) Take by mouth.    . DULoxetine (CYMBALTA) 60 MG capsule Take 1 capsule (60 mg total) by mouth 2 (two) times daily. 180 capsule 4  . gabapentin (NEURONTIN) 300 MG capsule Take 1  capsule (300 mg total) by mouth 3 (three) times daily. 270 capsule 4  . HUMALOG KWIKPEN 100 UNIT/ML KwikPen INJECT 15 TO 26 UNITS UNDER THE SKIN THREE TIMES DAILY 45 mL 0  . Insulin Detemir (LEVEMIR FLEXTOUCH) 100 UNIT/ML Pen Inject 7 units in the morning, and  27 units in the pm 45 mL 3  . Insulin Pen Needle (B-D UF III MINI PEN NEEDLES) 31G X 5 MM MISC USE FIVE TIMES DAILY AS DIRECTED 100 each 4  . famotidine (PEPCID) 40 MG tablet Take 1 tablet (40 mg total) by mouth daily. (Patient not taking: Reported on 07/10/2019) 90 tablet 1   No current facility-administered medications for this visit.     Allergies as of 07/10/2019 - Review Complete 07/10/2019  Allergen Reaction Noted  . Carboplatin Other (See Comments) 05/29/2014  . Remicade [infliximab] Anaphylaxis 05/13/2012  . Hydromorphone Rash 08/11/2014    Vitals: BP 108/72 (BP Location: Right Arm, Patient Position: Sitting)   Pulse (!) 116   Temp 98.4 F (36.9 C) Comment: taken by check-in staff  Ht '5\' 6"'  (1.676 m)   Wt 171 lb (77.6 kg)   BMI 27.60 kg/m  Last Weight:  Wt Readings from Last 1 Encounters:  07/10/19 171 lb (77.6 kg)   Last Height:   Ht Readings from Last 1 Encounters:  07/10/19 '5\' 6"'  (1.676 m)    Physical exam: Exam: Gen: NAD, conversant, well nourised, thin , well groomed                     CV: RRR, no MRG. No Carotid Bruits. No peripheral edema, warm, nontender Eyes: Conjunctivae clear without exudates or hemorrhage  Neuro: Detailed Neurologic Exam  Speech:    Speech is normal; fluent and spontaneous with normal comprehension.  Cognition:    The patient is oriented to person, place, and time;     recent and remote memory intact;     language fluent;     normal attention, concentration,     fund of knowledge Cranial Nerves:    The pupils are equal, round, and reactive to light. Visual fields are full to finger confrontation. Extraocular movements are intact. Trigeminal sensation is intact and the muscles of mastication are normal. The face is symmetric. The palate elevates in the midline. Hearing intact. Voice is normal. Shoulder shrug is normal. The tongue has normal motion without fasciculations.    Motor Observation:    No asymmetry, no atrophy, and no  involuntary movements noted. Tone:    Normal muscle tone.    Posture:    Posture is normal. normal erect    Strength:    Strength is V/V in the upper and lower limbs.       Assessment/Plan: This is a wonderful 78 year old white female with history of stage IIIB non-small cell lung cancer status postcarboplatin and paclitaxel with resultant severe neuropathy. Exam shows severe loss of sensation in the distal extremities, +Romberg, Ataxic gait and Allodynia and hyperesthesias. New onset headache since March, vision changes, eye pain.   - She is having plaque psoriasis lesions on the scalp  and also some nerve irritation in the areas of the trigeminal and occipital neres, likely atypical nerve pain secondary to inflammation from the plaque psoriasis  - MRI of the brain w/wo contrast due to new onset headache in a patient with prior cancer to look for mets or other etiology of unusual nerve pain and vision changes and eye pain, check esr/crp  for temporal arteritis however likelihood low.   Orders Placed This Encounter  Procedures  . MR BRAIN W WO CONTRAST  . Basic Metabolic Panel  . Sedimentation rate  . C-reactive protein    - Neuropathy: improved, Doing well can try and titrate off of the lyrica, has gained weight and has peripheral swelling stopping the Lyrica may help. Wouldn't recommend starting the Gabapentin unless needed as this can cause weight gain as well as swelling  - Likely left>right CTS: emg/ncs of bilateral uppers confirmed, conservative measures   Sarina Ill, MD  [facility] [address] [place], [place] [address]  Phone [phone] Fax [phone]  A total of 40 minutes was spent face-to-face with this patient. Over half this time was spent on counseling patient on the neuropathy and CTS diagnosis and different diagnostic and therapeutic options available.

## 2019-07-10 NOTE — Telephone Encounter (Signed)
Scheduled for 07/11/19.

## 2019-07-11 LAB — C-REACTIVE PROTEIN: CRP: 30 mg/L — ABNORMAL HIGH (ref 0–10)

## 2019-07-11 LAB — BASIC METABOLIC PANEL
BUN/Creatinine Ratio: 19 (ref 12–28)
BUN: 18 mg/dL (ref 8–27)
CO2: 23 mmol/L (ref 20–29)
Calcium: 8.8 mg/dL (ref 8.7–10.3)
Chloride: 98 mmol/L (ref 96–106)
Creatinine, Ser: 0.94 mg/dL (ref 0.57–1.00)
GFR calc Af Amer: 68 mL/min/{1.73_m2} (ref >59)
GFR calc non Af Amer: 59 mL/min/{1.73_m2} — ABNORMAL LOW (ref >59)
Glucose: 250 mg/dL — ABNORMAL HIGH (ref 65–99)
Potassium: 3.9 mmol/L (ref 3.5–5.2)
Sodium: 139 mmol/L (ref 134–144)

## 2019-07-11 LAB — SEDIMENTATION RATE: Sed Rate: 28 mm/h (ref 0–40)

## 2019-07-15 ENCOUNTER — Telehealth: Payer: Self-pay | Admitting: *Deleted

## 2019-07-15 NOTE — Telephone Encounter (Addendum)
Spoke with pt. Discussed lab results indicating Glucose 250, extremely elevated. Discussed some common symptoms of DKA and advised this is not a full list but if any symptoms go to ED (excessive thirst/urine output, abdominal pain, lethargy). Questions answered. Pt asked if her MRI results were back yet. I advised we would look into this. She verbalized appreciation.   ----- Message ----- From: Melvenia Beam, MD Sent: 07/11/2019   7:56 PM EDT  Glucose quite elevated at 250. This is extremely elevated needs follow up with primary care. For any symptoms of diabetic ketoacidosis go to the ED. thanks

## 2019-07-15 NOTE — Telephone Encounter (Signed)
Received MRI brain w & w/o contrast from [facility] on [facility].   Impression: Mild cerebral atrophy. Small developmental venous anomaly in the right cerebellar hemisphere. Otherwise essentially normal MRI of the brain.   The full report is ready for Dr. Cathren Laine review.

## 2019-07-17 ENCOUNTER — Other Ambulatory Visit: Payer: Self-pay | Admitting: Neurology

## 2019-07-17 DIAGNOSIS — R519 Headache, unspecified: Secondary | ICD-10-CM

## 2019-07-17 NOTE — Telephone Encounter (Signed)
I'd like to repeat esr and crp. I have very low suspicion for temporal arteritis but lets repeat these labs if she doesn't mind. thanks

## 2019-07-18 ENCOUNTER — Other Ambulatory Visit: Payer: Self-pay

## 2019-07-18 ENCOUNTER — Telehealth: Payer: Self-pay | Admitting: Neurology

## 2019-07-18 ENCOUNTER — Other Ambulatory Visit (INDEPENDENT_AMBULATORY_CARE_PROVIDER_SITE_OTHER): Payer: Self-pay

## 2019-07-18 DIAGNOSIS — R519 Headache, unspecified: Secondary | ICD-10-CM

## 2019-07-18 DIAGNOSIS — Z0289 Encounter for other administrative examinations: Secondary | ICD-10-CM

## 2019-07-18 NOTE — Telephone Encounter (Signed)
I spoke with the patient and gave her Dr. Cathren Laine message. Pt verbalized understanding and will come today around 4 pm for a repeat CRP & ESR. I have placed her on the lab schedule. Pt verbalized appreciation for the call.

## 2019-07-18 NOTE — Telephone Encounter (Signed)
MRI of the brain looks fine (unremarkable) for age, nothing to explain headaches (great news!)

## 2019-07-19 LAB — SEDIMENTATION RATE: Sed Rate: 11 mm/hr (ref 0–40)

## 2019-07-19 LAB — C-REACTIVE PROTEIN: CRP: 9 mg/L (ref 0–10)

## 2019-07-21 ENCOUNTER — Other Ambulatory Visit: Payer: Self-pay | Admitting: Internal Medicine

## 2019-07-22 NOTE — Telephone Encounter (Signed)
I called the pt and LVM asking for call back.   When the patient calls back, please let her know that Dr. Jaynee Eagles said her MRI of the brain looks fine. It's unremarkable for her age and does not show anything to explain the headaches. This is great news. Also, her repeat CRP and Sedimentation rate labs were normal, which Dr. Jaynee Eagles was also very pleased to see.

## 2019-07-23 NOTE — Telephone Encounter (Signed)
Spoke with the patient and she verbalized understanding her results. No questions or concerns at this time.   

## 2019-07-23 NOTE — Telephone Encounter (Signed)
Noted thanks °

## 2019-07-23 NOTE — Telephone Encounter (Signed)
I called the pt again and LVM asking for call back.   When the patient calls back, please let her know that Dr. Jaynee Eagles said her MRI of the brain looks fine. It's unremarkable for her age and does not show anything to explain the headaches. This is great news. Also, her repeat CRP and Sedimentation rate labs were normal, which Dr. Jaynee Eagles was also very pleased to see.

## 2019-08-02 ENCOUNTER — Other Ambulatory Visit: Payer: Self-pay

## 2019-08-02 ENCOUNTER — Ambulatory Visit (HOSPITAL_COMMUNITY)
Admission: RE | Admit: 2019-08-02 | Discharge: 2019-08-02 | Disposition: A | Discharge status: Home / Self Care | Payer: Medicare Other | Source: Ambulatory Visit | Attending: Internal Medicine | Admitting: Internal Medicine

## 2019-08-02 ENCOUNTER — Inpatient Hospital Stay: Payer: Medicare Other | Attending: Internal Medicine

## 2019-08-02 ENCOUNTER — Encounter (HOSPITAL_COMMUNITY): Payer: Self-pay

## 2019-08-02 ENCOUNTER — Telehealth: Payer: Self-pay | Admitting: *Deleted

## 2019-08-02 DIAGNOSIS — M069 Rheumatoid arthritis, unspecified: Secondary | ICD-10-CM | POA: Insufficient documentation

## 2019-08-02 DIAGNOSIS — Z794 Long term (current) use of insulin: Secondary | ICD-10-CM | POA: Diagnosis not present

## 2019-08-02 DIAGNOSIS — J439 Emphysema, unspecified: Secondary | ICD-10-CM | POA: Insufficient documentation

## 2019-08-02 DIAGNOSIS — I10 Essential (primary) hypertension: Secondary | ICD-10-CM | POA: Diagnosis not present

## 2019-08-02 DIAGNOSIS — C3490 Malignant neoplasm of unspecified part of unspecified bronchus or lung: Secondary | ICD-10-CM | POA: Diagnosis present

## 2019-08-02 DIAGNOSIS — E119 Type 2 diabetes mellitus without complications: Secondary | ICD-10-CM | POA: Insufficient documentation

## 2019-08-02 DIAGNOSIS — C3411 Malignant neoplasm of upper lobe, right bronchus or lung: Secondary | ICD-10-CM | POA: Diagnosis present

## 2019-08-02 DIAGNOSIS — Z9221 Personal history of antineoplastic chemotherapy: Secondary | ICD-10-CM | POA: Insufficient documentation

## 2019-08-02 DIAGNOSIS — Z79899 Other long term (current) drug therapy: Secondary | ICD-10-CM | POA: Insufficient documentation

## 2019-08-02 DIAGNOSIS — K219 Gastro-esophageal reflux disease without esophagitis: Secondary | ICD-10-CM | POA: Diagnosis not present

## 2019-08-02 DIAGNOSIS — Z7951 Long term (current) use of inhaled steroids: Secondary | ICD-10-CM | POA: Diagnosis not present

## 2019-08-02 DIAGNOSIS — Z923 Personal history of irradiation: Secondary | ICD-10-CM | POA: Diagnosis not present

## 2019-08-02 LAB — CBC WITH DIFFERENTIAL (CANCER CENTER ONLY)
Abs Immature Granulocytes: 0.21 10*3/uL — ABNORMAL HIGH (ref 0.00–0.07)
Basophils Absolute: 0.1 10*3/uL (ref 0.0–0.1)
Basophils Relative: 1 %
Eosinophils Absolute: 0.4 10*3/uL (ref 0.0–0.5)
Eosinophils Relative: 4 %
HCT: 45.7 % (ref 36.0–46.0)
Hemoglobin: 15.1 g/dL — ABNORMAL HIGH (ref 12.0–15.0)
Immature Granulocytes: 2 %
Lymphocytes Relative: 14 %
Lymphs Abs: 1.3 10*3/uL (ref 0.7–4.0)
MCH: 29.4 pg (ref 26.0–34.0)
MCHC: 33 g/dL (ref 30.0–36.0)
MCV: 89.1 fL (ref 80.0–100.0)
Monocytes Absolute: 0.5 10*3/uL (ref 0.1–1.0)
Monocytes Relative: 5 %
Neutro Abs: 6.8 10*3/uL (ref 1.7–7.7)
Neutrophils Relative %: 74 %
Platelet Count: 227 10*3/uL (ref 150–400)
RBC: 5.13 MIL/uL — ABNORMAL HIGH (ref 3.87–5.11)
RDW: 15.5 % (ref 11.5–15.5)
WBC Count: 9.2 10*3/uL (ref 4.0–10.5)
nRBC: 0 % (ref 0.0–0.2)

## 2019-08-02 LAB — CMP (CANCER CENTER ONLY)
ALT: 14 U/L (ref 0–44)
AST: 12 U/L — ABNORMAL LOW (ref 15–41)
Albumin: 3.9 g/dL (ref 3.5–5.0)
Alkaline Phosphatase: 161 U/L — ABNORMAL HIGH (ref 38–126)
Anion gap: 12 (ref 5–15)
BUN: 20 mg/dL (ref 8–23)
CO2: 26 mmol/L (ref 22–32)
Calcium: 9.9 mg/dL (ref 8.9–10.3)
Chloride: 98 mmol/L (ref 98–111)
Creatinine: 1.3 mg/dL — ABNORMAL HIGH (ref 0.44–1.00)
GFR, Est AFR Am: 46 mL/min — ABNORMAL LOW (ref >60)
GFR, Estimated: 40 mL/min — ABNORMAL LOW (ref >60)
Glucose, Bld: 451 mg/dL — ABNORMAL HIGH (ref 70–99)
Potassium: 3.7 mmol/L (ref 3.5–5.1)
Sodium: 136 mmol/L (ref 135–145)
Total Bilirubin: 0.8 mg/dL (ref 0.3–1.2)
Total Protein: 7.3 g/dL (ref 6.5–8.1)

## 2019-08-02 MED ORDER — IOHEXOL 300 MG/ML  SOLN
75.0000 mL | Freq: Once | INTRAMUSCULAR | Status: AC | PRN
  Administered 2019-08-02: 60 mL via INTRAVENOUS

## 2019-08-02 MED ORDER — SODIUM CHLORIDE (PF) 0.9 % IJ SOLN
INTRAMUSCULAR | Status: AC
  Filled 2019-08-02: qty 50

## 2019-08-02 NOTE — Telephone Encounter (Signed)
TCT patient regarding results from today's labs. Spoke with patient and informed her that her blood sugar was quite elevated today @451 . Per Dr. Julien Nordmann, pt is to follow up with her PCP regarding blood sugar control and any possible insulin/medication/dietary adjustment.

## 2019-08-02 NOTE — Telephone Encounter (Signed)
-----   Message from Curt Bears, MD sent at 08/02/2019  9:50 AM EDT ----- Please call the patient with her high blood glucose level and she needs to check with her primary care physician or take her insulin as recommended by her primary care physician.  Thank you. ----- Message ----- From: Buel Ream, Lab In [place] Sent: 08/02/2019   9:23 AM EDT To: Curt Bears, MD

## 2019-08-06 ENCOUNTER — Inpatient Hospital Stay: Payer: Medicare Other | Admitting: Internal Medicine

## 2019-08-06 ENCOUNTER — Other Ambulatory Visit: Payer: Self-pay | Admitting: Internal Medicine

## 2019-08-06 ENCOUNTER — Other Ambulatory Visit: Payer: Self-pay

## 2019-08-06 ENCOUNTER — Encounter: Payer: Self-pay | Admitting: Internal Medicine

## 2019-08-06 VITALS — BP 129/79 | HR 110 | Temp 98.2°F | Resp 18 | Ht 66.0 in | Wt 172.4 lb

## 2019-08-06 DIAGNOSIS — I10 Essential (primary) hypertension: Secondary | ICD-10-CM

## 2019-08-06 DIAGNOSIS — C3411 Malignant neoplasm of upper lobe, right bronchus or lung: Secondary | ICD-10-CM

## 2019-08-06 DIAGNOSIS — E139 Other specified diabetes mellitus without complications: Secondary | ICD-10-CM

## 2019-08-06 DIAGNOSIS — C3490 Malignant neoplasm of unspecified part of unspecified bronchus or lung: Secondary | ICD-10-CM | POA: Diagnosis not present

## 2019-08-06 NOTE — Progress Notes (Signed)
[place] [phone]   [phone]  OFFICE PROGRESS NOTE  Binnie Rail, MD [place] [address]  DIAGNOSIS: Squamous cell lung cancer  Primary site: Lung (Right)  Staging method: AJCC 7th Edition  Clinical: Stage IIIB (T3, N3, M0)  Summary: Stage IIIB (T3, N3, M0)   PRIOR THERAPY:  1) Concurrent chemoradiation with weekly chemotherapy in the form of carboplatin for AUC of 2 and paclitaxel 45 mg/m2. Status post 7 week of treatment. Last dose was given 01/27/2014 no significant response in her disease.  2) Consolidation chemotherapy with carboplatin for AUC of 5 and paclitaxel 175 mg/M2 every 3 weeks with Neulasta support. First dose on 04/15/2014. Status post 3 cycles. Carboplatin was discontinued starting cycle #2 secondary to hypersensitivity reaction. 3) Immunotherapy with Opdivo (Nivolumab) 3mg /kg given every 2 weeks. Status post 33 cycles discontinued secondary to intolerance.  CURRENT THERAPY: Observation.  INTERVAL HISTORY: Megan [name] 78 y.o. female returns to the clinic today for 6 months follow-up visit.  The patient is feeling fine today with no concerning complaints.  She denied having any chest pain, shortness of breath, cough or hemoptysis.  She denied having any fever or chills.  She has no nausea, vomiting, diarrhea or constipation.  She denied having any headache or visual changes.  She had repeat CT scan of the chest performed recently and she is here for evaluation and discussion of her scan results.  The patient has uncontrolled diabetes mellitus and she is followed by endocrinology.  MEDICAL HISTORY: Past Medical History:  Diagnosis Date  . Cancer ([place]) dx'd 09/2013   Lung ca  . Concussion 2018  . Diabetes ([place]) 09/08/2016   type I diabetic patient reported on 09/13/16   . Diabetes mellitus    insulin  . Diabetic neuropathy ([place]) 09/08/2016  . Fall 2018  . GERD (gastroesophageal reflux  disease)    no meds for  . History of colitis   . History of hiatal hernia   . Hx of radiation therapy 12/16/13-01/30/14   lung 66Gy  . Hypertension   . Malignant neoplasm of right upper lobe of lung ([place]) 11/28/2013  . Neuropathy   . Psoriasis   . Psoriatic arthritis ([place]) 09/08/2016  . Rheumatoid arthritis ([place])   . Shortness of breath dyspnea    with exertion    ALLERGIES:  is allergic to carboplatin; remicade [infliximab]; and hydromorphone.  MEDICATIONS:  Current Outpatient Medications  Medication Sig Dispense Refill  . albuterol (PROVENTIL HFA;VENTOLIN HFA) 108 (90 Base) MCG/ACT inhaler Inhale 2 puffs into the lungs every 6 (six) hours as needed for wheezing or shortness of breath. 1 Inhaler 2  . amLODipine (NORVASC) 10 MG tablet TAKE 1 TABLET(10 MG) BY MOUTH DAILY 90 tablet 1  . betamethasone dipropionate (DIPROLENE) 0.05 % cream Apply topically 2 (two) times daily. 45 g 0  . budesonide-formoterol (SYMBICORT) 80-4.5 MCG/ACT inhaler Inhale 2 puffs into the lungs 2 (two) times daily. (Patient taking differently: Inhale 2 puffs into the lungs daily as needed (Wheezing). ) 1 Inhaler 6  . Cyanocobalamin (VITAMIN B-12 PO) Take by mouth.    . DULoxetine (CYMBALTA) 60 MG capsule Take 1 capsule (60 mg total) by mouth 2 (two) times daily. 180 capsule 4  . famotidine (PEPCID) 40 MG tablet Take 1 tablet (40 mg total) by mouth daily. (Patient not taking: Reported on 07/10/2019) 90 tablet 1  . gabapentin (NEURONTIN) 300 MG capsule Take 1 capsule (300  mg total) by mouth 3 (three) times daily. 270 capsule 4  . HUMALOG KWIKPEN 100 UNIT/ML KwikPen INJECT 15 TO 26 UNITS UNDER THE SKIN THREE TIMES DAILY 45 mL 0  . Insulin Pen Needle (B-D UF III MINI PEN NEEDLES) 31G X 5 MM MISC USE FIVE TIMES DAILY AS DIRECTED 100 each 4  . LEVEMIR FLEXTOUCH 100 UNIT/ML Pen INJECT 17 UNITS UNDER THE SKIN IN THE MORNING AND 28 UNITS IN THE EVENING 45 mL 3   No current facility-administered medications for this visit.      SURGICAL HISTORY:  Past Surgical History:  Procedure Laterality Date  . ABDOMINAL HYSTERECTOMY    . ABDOMINAL SURGERY    . BALLOON DILATION N/A 12/12/2014   Procedure: BALLOON DILATION;  Surgeon: Inda Castle, MD;  Location: [facility];  Service: Endoscopy;  Laterality: N/A;  . CHOLECYSTECTOMY    . COLONOSCOPY N/A 02/07/2016   Procedure: COLONOSCOPY;  Surgeon: Doran Stabler, MD;  Location: [facility];  Service: Endoscopy;  Laterality: N/A;  . DILATION AND CURETTAGE OF UTERUS    . ESOPHAGOGASTRODUODENOSCOPY N/A 12/12/2014   Procedure: ESOPHAGOGASTRODUODENOSCOPY (EGD);  Surgeon: Inda Castle, MD;  Location: [facility];  Service: Endoscopy;  Laterality: N/A;  with dilation  . ESOPHAGOGASTRODUODENOSCOPY (EGD) WITH PROPOFOL N/A 10/09/2014   Procedure: ESOPHAGOGASTRODUODENOSCOPY (EGD) WITH PROPOFOL;  Surgeon: Inda Castle, MD;  Location: [place];  Service: Endoscopy;  Laterality: N/A;  . ESOPHAGOGASTRODUODENOSCOPY (EGD) WITH PROPOFOL N/A 09/19/2016   Procedure: ESOPHAGOGASTRODUODENOSCOPY (EGD) WITH PROPOFOL;  Surgeon: Doran Stabler, MD;  Location: [facility];  Service: Gastroenterology;  Laterality: N/A;  with savary dil tt  . FRACTURE SURGERY    . ORIF ANKLE FRACTURE Right 07/04/2015   Procedure: OPEN REDUCTION INTERNAL FIXATION (ORIF) ANKLE FRACTURE;  Surgeon: Newt Minion, MD;  Location: [place];  Service: Orthopedics;  Laterality: Right;  OPEN REDUCTION, INTERNAL FIXATION OF RIGHT ANKLE FRACTURE.   [name] ORIF ANKLE FRACTURE Left 02/10/2016   Procedure: OPEN REDUCTION INTERNAL FIXATION (ORIF) ANKLE FRACTURE;  Surgeon: Newt Minion, MD;  Location: [place];  Service: Orthopedics;  Laterality: Left;  . SAVORY DILATION N/A 10/09/2014   Procedure: SAVORY DILATION;  Surgeon: Inda Castle, MD;  Location: [place];  Service: Endoscopy;  Laterality: N/A;  . SAVORY DILATION N/A 09/19/2016   Procedure: SAVORY DILATION;  Surgeon: Doran Stabler, MD;  Location: [facility];   Service: Gastroenterology;  Laterality: N/A;  . TONSILLECTOMY      REVIEW OF SYSTEMS:  A comprehensive review of systems was negative.   PHYSICAL EXAMINATION: General appearance: alert, cooperative and no distress Head: Normocephalic, without obvious abnormality, atraumatic Neck: no adenopathy, no JVD, supple, symmetrical, trachea midline and thyroid not enlarged, symmetric, no tenderness/mass/nodules Lymph nodes: Cervical, supraclavicular, and axillary nodes normal. Resp: clear to auscultation bilaterally Back: symmetric, no curvature. ROM normal. No CVA tenderness. Cardio: regular rate and rhythm, S1, S2 normal, no murmur, click, rub or gallop GI: soft, non-tender; bowel sounds normal; no masses,  no organomegaly Extremities: extremities normal, atraumatic, no cyanosis or edema    ECOG PERFORMANCE STATUS: 1 - Symptomatic but completely ambulatory  Blood pressure 129/79, pulse (!) 110, temperature 98.2 F (36.8 C), temperature source Temporal, resp. rate 18, height 5\' 6"  (1.676 m), weight 172 lb 6.4 oz (78.2 kg), SpO2 97 %.  LABORATORY DATA: Lab Results  Component Value Date   WBC 9.2 08/02/2019   HGB 15.1 (H) 08/02/2019   HCT 45.7 08/02/2019   MCV  89.1 08/02/2019   PLT 227 08/02/2019      Chemistry      Component Value Date/Time   NA 136 08/02/2019 0910   NA 139 07/10/2019 0922   NA 141 09/08/2017 1213   K 3.7 08/02/2019 0910   K 4.1 09/08/2017 1213   CL 98 08/02/2019 0910   CO2 26 08/02/2019 0910   CO2 28 09/08/2017 1213   BUN 20 08/02/2019 0910   BUN 18 07/10/2019 0922   BUN 13.8 09/08/2017 1213   CREATININE 1.30 (H) 08/02/2019 0910   CREATININE 0.9 09/08/2017 1213   GLU 176 03/07/2016      Component Value Date/Time   CALCIUM 9.9 08/02/2019 0910   CALCIUM 9.4 09/08/2017 1213   ALKPHOS 161 (H) 08/02/2019 0910   ALKPHOS 182 (H) 09/08/2017 1213   AST 12 (L) 08/02/2019 0910   AST 15 09/08/2017 1213   ALT 14 08/02/2019 0910   ALT 17 09/08/2017 1213   BILITOT  0.8 08/02/2019 0910   BILITOT 0.45 09/08/2017 1213       RADIOGRAPHIC STUDIES: Ct Chest W Contrast  Result Date: 08/02/2019 CLINICAL DATA:  History of right lung cancer, diagnosed 2015, chemotherapy and XRT complete. Shortness of breath. EXAM: CT CHEST WITH CONTRAST TECHNIQUE: Multidetector CT imaging of the chest was performed during intravenous contrast administration. CONTRAST:  60mL OMNIPAQUE IOHEXOL 300 MG/ML  SOLN COMPARISON:  CTA chest dated 10/11/2018 FINDINGS: Cardiovascular: Heart is normal in size. Trace anterior pericardial fluid. No evidence of thoracic aortic aneurysm. Atherosclerotic calcifications of the aortic arch. Mild coronary atherosclerosis of the LAD. Mediastinum/Nodes: No suspicious mediastinal lymphadenopathy. Calcified left perihilar nodes. Visualized thyroid is unremarkable. Lungs/Pleura: Status post right upper lobectomy. Complete atelectasis of the right middle lobe, unchanged. Scarring/radiation changes in the posterior right paramediastinal region along the medial right lower lobe. Mild paraseptal emphysematous changes in the lungs bilaterally. Mild right apical pleural-parenchymal scarring. Calcified granuloma in the left lower lobe (series 7/image 95). No suspicious pulmonary nodules. No focal consolidation. No pleural effusion or pneumothorax. Upper Abdomen: Visualized upper abdomen is notable for benign hepatic cysts, vascular calcifications, and prior cholecystectomy. Musculoskeletal: Degenerative changes of the visualized thoracolumbar spine. IMPRESSION: Status post right upper lobectomy. Scarring/radiation changes in the posterior right paramediastinal region. No evidence of recurrent or metastatic disease. Aortic Atherosclerosis (ICD10-I70.0) and Emphysema (ICD10-J43.9). Electronically Signed   By: Julian Hy M.D.   On: 08/02/2019 11:35    ASSESSMENT AND PLAN:  This is a very pleasant 78 years old white female with metastatic non-small cell lung cancer  status post a course of concurrent chemoradiation as well as consolidation chemotherapy discontinued secondary to intolerance and significant neuropathy. The patient was treated with immunotherapy with Nivolumab status post 33 cycles and tolerated the treatment well but it was discontinued secondary to chemotherapy-induced colitis and diarrhea. The patient has been in observation now for several years.  She is doing fine with no concerning complaints. She had repeat CT scan of the chest performed recently.  I personally and independently reviewed the scans and discussed the results with the patient today. Her scan showed no concerning findings for disease recurrence or progression. I recommended for the patient to continue on observation with repeat CT scan of the chest in 6 months. For the diabetes mellitus, she will continue her routine follow-up visit and evaluation by her endocrinologist. The patient was advised to call immediately if she has any concerning symptoms in the interval. The patient voices understanding of current disease status and treatment  options and is in agreement with the current care plan. All questions were answered. The patient knows to call the clinic with any problems, questions or concerns. We can certainly see the patient much sooner if necessary. I spent 10 minutes counseling the patient face to face. The total time spent in the appointment was 15 minutes.  Disclaimer: This note was dictated with voice recognition software. Similar sounding words can inadvertently be transcribed and may not be corrected upon review.

## 2019-08-07 ENCOUNTER — Telehealth: Payer: Self-pay | Admitting: Internal Medicine

## 2019-08-07 ENCOUNTER — Encounter: Payer: Medicare Other | Attending: Internal Medicine | Admitting: Nutrition

## 2019-08-07 DIAGNOSIS — Z794 Long term (current) use of insulin: Secondary | ICD-10-CM | POA: Insufficient documentation

## 2019-08-07 DIAGNOSIS — E139 Other specified diabetes mellitus without complications: Secondary | ICD-10-CM

## 2019-08-07 DIAGNOSIS — E119 Type 2 diabetes mellitus without complications: Secondary | ICD-10-CM | POA: Insufficient documentation

## 2019-08-07 NOTE — Progress Notes (Signed)
Patient came in today because receiver was alerting her that the sensor had expired, and she did not know what to do.  She did not bring a new sensor in today.  Said she forgot it.  We took off the old sensor, saved the transmitter and reviewed how to put on the new sensor.  She said she understood this.  She did not remember that she had to put in the sensor code for this, and we reviewed the steps for this.   She will call me later if she can not figure it out when she gets home.

## 2019-08-07 NOTE — Telephone Encounter (Signed)
Scheduled appt per 10/6 los - mailed reminder letter with appt date and time

## 2019-08-07 NOTE — Patient Instructions (Signed)
Insert a new sensor and put in the sensor code into your receiver.

## 2019-08-14 ENCOUNTER — Other Ambulatory Visit: Payer: Self-pay | Admitting: Internal Medicine

## 2019-08-15 ENCOUNTER — Telehealth: Payer: Self-pay | Admitting: Internal Medicine

## 2019-08-15 NOTE — Telephone Encounter (Signed)
Patient confirmed date and time of appointments, patient received [facility]'s number.

## 2019-08-23 ENCOUNTER — Telehealth: Payer: Self-pay | Admitting: Internal Medicine

## 2019-08-23 DIAGNOSIS — T849XXA Unspecified complication of internal orthopedic prosthetic device, implant and graft, initial encounter: Secondary | ICD-10-CM | POA: Insufficient documentation

## 2019-08-23 NOTE — Telephone Encounter (Signed)
Called patient to schedule a bone density. She declines at this time.

## 2019-09-16 ENCOUNTER — Other Ambulatory Visit: Payer: Self-pay | Admitting: *Deleted

## 2019-09-16 NOTE — Telephone Encounter (Signed)
Received paper refill request from [facility] on [facility] for Duloxetine DR 60 mg. Per request, pt last filled 07/16/2018. Prescription was last sent to [facility] on [facility] [facility] 09/05/18 by Dr. Jaynee Eagles. This refill was denied and message was faxed back to [facility] asking for pt to call our office to discuss. Received a receipt of confirmation.

## 2019-10-02 ENCOUNTER — Other Ambulatory Visit: Payer: Self-pay | Admitting: Neurology

## 2019-10-02 ENCOUNTER — Telehealth: Payer: Self-pay | Admitting: Neurology

## 2019-10-02 MED ORDER — DULOXETINE HCL 60 MG PO CPEP: 60.0000 mg | ORAL_CAPSULE | Freq: Two times a day (BID) | ORAL | 4 refills | Status: DC

## 2019-10-02 NOTE — Telephone Encounter (Signed)
Pt has called for a refill DULoxetine (CYMBALTA) 60 MG capsule  [facility] [phone]

## 2019-10-30 ENCOUNTER — Other Ambulatory Visit: Payer: Self-pay

## 2019-10-30 MED ORDER — BD PEN NEEDLE MINI U/F 31G X 5 MM MISC: 11 refills | Status: DC

## 2019-11-01 ENCOUNTER — Other Ambulatory Visit: Payer: Self-pay | Admitting: Neurology

## 2019-11-02 ENCOUNTER — Other Ambulatory Visit: Payer: Self-pay | Admitting: Internal Medicine

## 2019-11-06 NOTE — Patient Instructions (Signed)
  Blood work was ordered.     Medications reviewed and updated.  Changes include :     Your prescription(s) have been submitted to your pharmacy. Please take as directed and contact our office if you believe you are having problem(s) with the medication(s).  A referral was ordered for    Please followup in 6 months

## 2019-11-06 NOTE — Progress Notes (Signed)
Subjective:    Patient ID: Megan [name], female    DOB: 1941-09-19, 79 y.o.   MRN: [id number]  HPI    Medications and allergies reviewed with patient and updated if appropriate.  Patient Active Problem List   Diagnosis Date Noted  . Head pain 05/10/2019  . GERD (gastroesophageal reflux disease) 10/17/2018  . CAP (community acquired pneumonia) 10/15/2018  . Post herpetic neuralgia 03/30/2018  . Herpes zoster without complication 15/40/0867  . Right elbow pain 03/01/2018  . Carpal tunnel syndrome on right 11/30/2017  . Cough 08/22/2017  . Hypokalemia 03/22/2017  . Primary osteoarthritis of both hands 03/08/2017  . Poor balance 02/07/2017  . Subarachnoid bleed ([place]) 01/11/2017  . Psoriatic arthropathy ([place]) 09/08/2016  . Hypertension 09/08/2016  . Diabetic neuropathy ([place]) 09/08/2016  . LADA (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), managed as type 1 ([place]) 05/04/2016  . Syncope 03/25/2016  . Malnutrition of moderate degree 02/29/2016  . Diabetic ketoacidosis with coma associated with diabetes mellitus due to underlying condition ([place])   . RLS (restless legs syndrome) 12/11/2015  . Lumbago 05/06/2015  . Peripheral edema 04/14/2015  . Psoriasis 04/14/2015  . Emphysema of lung ([place]) 11/26/2014  . Radiation-induced esophageal stricture 10/01/2014  . Tachycardia 08/27/2014  . DNR (do not resuscitate) discussion 08/21/2014  . Cancer associated pain 07/29/2014  . Malignant cachexia ([place]) 07/29/2014  . Fatigue 07/18/2014  . Neuropathy due to chemotherapeutic drug ([place]) 07/18/2014  . Leg cramps 07/18/2014  . Thrombocytopenia, unspecified ([place]) 07/18/2014  . Dyspnea 04/28/2014  . Radiation esophagitis 04/10/2014  . Right-sided chest wall pain 04/01/2014  . Cancer of middle lobe of lung ([place]) 01/17/2014  . Malignant neoplasm of right upper lobe of lung ([place]) 11/28/2013    Current Outpatient Medications on File Prior to Visit  Medication Sig Dispense Refill  .  albuterol (PROVENTIL HFA;VENTOLIN HFA) 108 (90 Base) MCG/ACT inhaler Inhale 2 puffs into the lungs every 6 (six) hours as needed for wheezing or shortness of breath. 1 Inhaler 2  . amLODipine (NORVASC) 10 MG tablet TAKE 1 TABLET(10 MG) BY MOUTH DAILY 90 tablet 1  . betamethasone dipropionate (DIPROLENE) 0.05 % cream Apply topically 2 (two) times daily. 45 g 0  . budesonide-formoterol (SYMBICORT) 80-4.5 MCG/ACT inhaler Inhale 2 puffs into the lungs 2 (two) times daily. (Patient taking differently: Inhale 2 puffs into the lungs daily as needed (Wheezing). ) 1 Inhaler 6  . Cyanocobalamin (VITAMIN B-12 PO) Take by mouth.    . DULoxetine (CYMBALTA) 60 MG capsule Take 1 capsule (60 mg total) by mouth 2 (two) times daily. 180 capsule 4  . gabapentin (NEURONTIN) 300 MG capsule TAKE 1 CAPSULE(300 MG) BY MOUTH THREE TIMES DAILY 270 capsule 4  . HUMALOG KWIKPEN 100 UNIT/ML KwikPen INJECT 15 TO 26 UNITS UNDER THE SKIN THREE TIMES DAILY 45 mL 0  . Insulin Pen Needle (B-D UF III MINI PEN NEEDLES) 31G X 5 MM MISC Use with insulin pen 4 times a day to inject insulin 360 each 11  . LEVEMIR FLEXTOUCH 100 UNIT/ML Pen INJECT 17 UNITS UNDER THE SKIN IN THE MORNING AND 28 UNITS IN THE EVENING 45 mL 3   No current facility-administered medications on file prior to visit.    Past Medical History:  Diagnosis Date  . Cancer ([place]) dx'd 09/2013   Lung ca  . Concussion 2018  . Diabetes ([place]) 09/08/2016   type I diabetic patient reported on 09/13/16   . Diabetes mellitus    insulin  .  Diabetic neuropathy ([place]) 09/08/2016  . Fall 2018  . GERD (gastroesophageal reflux disease)    no meds for  . History of colitis   . History of hiatal hernia   . Hx of radiation therapy 12/16/13-01/30/14   lung 66Gy  . Hypertension   . Malignant neoplasm of right upper lobe of lung ([place]) 11/28/2013  . Neuropathy   . Psoriasis   . Psoriatic arthritis ([place]) 09/08/2016  . Rheumatoid arthritis ([place])   . Shortness of breath dyspnea    with  exertion    Past Surgical History:  Procedure Laterality Date  . ABDOMINAL HYSTERECTOMY    . ABDOMINAL SURGERY    . BALLOON DILATION N/A 12/12/2014   Procedure: BALLOON DILATION;  Surgeon: Inda Castle, MD;  Location: [facility];  Service: Endoscopy;  Laterality: N/A;  . CHOLECYSTECTOMY    . COLONOSCOPY N/A 02/07/2016   Procedure: COLONOSCOPY;  Surgeon: Doran Stabler, MD;  Location[facility];  Service: Endoscopy;  Laterality: N/A;  . DILATION AND CURETTAGE OF UTERUS    . ESOPHAGOGASTRODUODENOSCOPY N/A 12/12/2014   Procedure: ESOPHAGOGASTRODUODENOSCOPY (EGD);  Surgeon: Inda Castle, MD;  Location: [facility];  Service: Endoscopy;  Laterality: N/A;  with dilation  . ESOPHAGOGASTRODUODENOSCOPY (EGD) WITH PROPOFOL N/A 10/09/2014   Procedure: ESOPHAGOGASTRODUODENOSCOPY (EGD) WITH PROPOFOL;  Surgeon: Inda Castle, MD;  Location: [place];  Service: Endoscopy;  Laterality: N/A;  . ESOPHAGOGASTRODUODENOSCOPY (EGD) WITH PROPOFOL N/A 09/19/2016   Procedure: ESOPHAGOGASTRODUODENOSCOPY (EGD) WITH PROPOFOL;  Surgeon: Doran Stabler, MD;  [facility];  Service: Gastroenterology;  Laterality: N/A;  with savary dil tt  . FRACTURE SURGERY    . ORIF ANKLE FRACTURE Right 07/04/2015   Procedure: OPEN REDUCTION INTERNAL FIXATION (ORIF) ANKLE FRACTURE;  Surgeon: Newt Minion, MD;  Location: [place];  Service: Orthopedics;  Laterality: Right;  OPEN REDUCTION, INTERNAL FIXATION OF RIGHT ANKLE FRACTURE.   [name] ORIF ANKLE FRACTURE Left 02/10/2016   Procedure: OPEN REDUCTION INTERNAL FIXATION (ORIF) ANKLE FRACTURE;  Surgeon: Newt Minion, MD;  Location: [place];  Service: Orthopedics;  Laterality: Left;  . SAVORY DILATION N/A 10/09/2014   Procedure: SAVORY DILATION;  Surgeon: Inda Castle, MD;  Location: [place];  Service: Endoscopy;  Laterality: N/A;  . SAVORY DILATION N/A 09/19/2016   Procedure: SAVORY DILATION;  Surgeon: Doran Stabler, MD;  Location[facility];  Service:  Gastroenterology;  Laterality: N/A;  . TONSILLECTOMY      Social History   Socioeconomic History  . Marital status: Divorced    Spouse name: Not on file  . Number of children: 0  . Years of education: MA  . Highest education level: Master's degree (e.g., MA, MS, MEng, MEd, MSW, MBA)  Occupational History  . Occupation: Retired    Fish farm manager: OTHER  Tobacco Use  . Smoking status: Former Smoker    Packs/day: 3.00    Years: 45.00    Pack years: 135.00    Types: Cigarettes    Quit date: 12/05/1997    Years since quitting: 21.9  . Smokeless tobacco: Never Used  Substance and Sexual Activity  . Alcohol use: No    Alcohol/week: 0.0 standard drinks    Comment: rarely   . Drug use: No  . Sexual activity: Not Currently  Other Topics Concern  . Not on file  Social History Narrative   Patient lives at home alone.   Caffeine Use: 1 cups daily   Right handed   Social Determinants of  Health   Financial Resource Strain:   . Difficulty of Paying Living Expenses: Not on file  Food Insecurity:   . Worried About Charity fundraiser in the Last Year: Not on file  . Ran Out of Food in the Last Year: Not on file  Transportation Needs:   . Lack of Transportation (Medical): Not on file  . Lack of Transportation (Non-Medical): Not on file  Physical Activity:   . Days of Exercise per Week: Not on file  . Minutes of Exercise per Session: Not on file  Stress:   . Feeling of Stress : Not on file  Social Connections:   . Frequency of Communication with Friends and Family: Not on file  . Frequency of Social Gatherings with Friends and Family: Not on file  . Attends Religious Services: Not on file  . Active Member of Clubs or Organizations: Not on file  . Attends Archivist Meetings: Not on file  . Marital Status: Not on file    Family History  Problem Relation Age of Onset  . Other Mother   . Anuerysm Mother   . Other Father        failure to thrive  . Hypertension Other   .  Stroke Other   . Heart attack Other   . Hemachromatosis Other   . Rheum arthritis Other        both sets of grandparents and father  . Migraines Neg Hx     Review of Systems     Objective:  There were no vitals filed for this visit. BP Readings from Last 3 Encounters:  08/06/19 129/79  07/10/19 108/72  05/10/19 118/68   Wt Readings from Last 3 Encounters:  08/06/19 172 lb 6.4 oz (78.2 kg)  07/10/19 171 lb (77.6 kg)  05/10/19 173 lb 12.8 oz (78.8 kg)   There is no height or weight on file to calculate BMI.   Physical Exam        Assessment & Plan:    See Problem List for Assessment and Plan of chronic medical problems.    This visit occurred during the SARS-CoV-2 public health emergency.  Safety protocols were in place, including screening questions prior to the visit, additional usage of staff PPE, and extensive cleaning of exam room while observing appropriate contact time as indicated for disinfecting solutions.    This encounter was created in error - please disregard.

## 2019-11-07 ENCOUNTER — Encounter: Payer: Medicare Other | Admitting: Internal Medicine

## 2019-11-11 DIAGNOSIS — L4059 Other psoriatic arthropathy: Secondary | ICD-10-CM | POA: Diagnosis not present

## 2019-11-11 DIAGNOSIS — K219 Gastro-esophageal reflux disease without esophagitis: Secondary | ICD-10-CM | POA: Diagnosis not present

## 2019-11-11 DIAGNOSIS — E1110 Type 2 diabetes mellitus with ketoacidosis without coma: Secondary | ICD-10-CM | POA: Diagnosis not present

## 2019-11-11 DIAGNOSIS — E1159 Type 2 diabetes mellitus with other circulatory complications: Secondary | ICD-10-CM | POA: Diagnosis not present

## 2019-11-11 DIAGNOSIS — K760 Fatty (change of) liver, not elsewhere classified: Secondary | ICD-10-CM | POA: Diagnosis not present

## 2019-11-11 DIAGNOSIS — C342 Malignant neoplasm of middle lobe, bronchus or lung: Secondary | ICD-10-CM | POA: Diagnosis not present

## 2019-11-11 DIAGNOSIS — N3281 Overactive bladder: Secondary | ICD-10-CM | POA: Diagnosis not present

## 2019-11-11 DIAGNOSIS — F17211 Nicotine dependence, cigarettes, in remission: Secondary | ICD-10-CM | POA: Diagnosis not present

## 2019-11-11 DIAGNOSIS — E1042 Type 1 diabetes mellitus with diabetic polyneuropathy: Secondary | ICD-10-CM | POA: Diagnosis not present

## 2019-11-12 ENCOUNTER — Other Ambulatory Visit: Payer: Self-pay | Admitting: Internal Medicine

## 2019-11-16 NOTE — Patient Instructions (Addendum)
  Blood work was ordered.     Medications reviewed and updated.  Changes include :   None  Your prescription(s) have been submitted to your pharmacy. Please take as directed and contact our office if you believe you are having problem(s) with the medication(s).    Follow up with Dr Cruzita Lederer - call her to schedule an appointment.     Please followup in 6 months

## 2019-11-16 NOTE — Progress Notes (Signed)
Subjective:    Patient ID: Megan [name], female    DOB: 04-May-1941, 79 y.o.   MRN: [id number]  HPI The patient is here for follow up of their chronic medical problems, including htn, diabetes, GERD, h/o lung cancer with chronic SOB and dry cough and headaches.  She is taking all of her medications as prescribed.   She is not exercising regularly.  She is very sedentary.  She sees Dr Cruzita Lederer for her diabetes.  She is currently in observation for her lung cancer.  She has an appointment with Dr. Earlie Server scheduled.  She saw Dr Jaynee Eagles for her headaches and neuropathy.  She is taking Cymbalta and gabapentin for both.  She was on Lyrica in the past, but this caused weight gain and edema.   In October she moved to [place].  In the end of December she had a high sugar of 600.  She was in the hospital for 4 days.  She is not able to provide any specific history except for that they would not give her any food or water because she was vomiting.  They were giving her IV fluids.  Her potassium and calcium were low and they did discharge her home with prescriptions.  She states she did not take the potassium and wondered if she could get a prescription.  She admits she was eating too much sugar.  Since decreasing her sugar intake her sugars often go up to the 200s, but quickly come down with drinking water and taking her insulin.  She has no concerns.    Medications and allergies reviewed with patient and updated if appropriate.  Patient Active Problem List   Diagnosis Date Noted  . Head pain 05/10/2019  . GERD (gastroesophageal reflux disease) 10/17/2018  . CAP (community acquired pneumonia) 10/15/2018  . Post herpetic neuralgia 03/30/2018  . Herpes zoster without complication 95/18/8416  . Right elbow pain 03/01/2018  . Carpal tunnel syndrome on right 11/30/2017  . Cough 08/22/2017  . Hypokalemia 03/22/2017  . Primary osteoarthritis of both hands 03/08/2017  . Poor  balance 02/07/2017  . Subarachnoid bleed ([place]) 01/11/2017  . Psoriatic arthropathy ([place]) 09/08/2016  . Hypertension 09/08/2016  . Diabetic neuropathy ([place]) 09/08/2016  . LADA (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), managed as type 1 ([place]) 05/04/2016  . Syncope 03/25/2016  . Malnutrition of moderate degree 02/29/2016  . Diabetic ketoacidosis with coma associated with diabetes mellitus due to underlying condition ([place])   . RLS (restless legs syndrome) 12/11/2015  . Lumbago 05/06/2015  . Peripheral edema 04/14/2015  . Psoriasis 04/14/2015  . Emphysema of lung ([place]) 11/26/2014  . Radiation-induced esophageal stricture 10/01/2014  . Tachycardia 08/27/2014  . DNR (do not resuscitate) discussion 08/21/2014  . Cancer associated pain 07/29/2014  . Neuropathy due to chemotherapeutic drug ([place]) 07/18/2014  . Leg cramps 07/18/2014  . Thrombocytopenia, unspecified ([place]) 07/18/2014  . Dyspnea 04/28/2014  . Radiation esophagitis 04/10/2014  . Right-sided chest wall pain 04/01/2014  . Cancer of middle lobe of lung ([place]) 01/17/2014  . Malignant neoplasm of right upper lobe of lung ([place]) 11/28/2013    Current Outpatient Medications on File Prior to Visit  Medication Sig Dispense Refill  . albuterol (PROVENTIL HFA;VENTOLIN HFA) 108 (90 Base) MCG/ACT inhaler Inhale 2 puffs into the lungs every 6 (six) hours as needed for wheezing or shortness of breath. 1 Inhaler 2  . betamethasone dipropionate (DIPROLENE) 0.05 % cream Apply topically 2 (two) times daily. 45 g 0  .  budesonide-formoterol (SYMBICORT) 80-4.5 MCG/ACT inhaler Inhale 2 puffs into the lungs 2 (two) times daily. (Patient taking differently: Inhale 2 puffs into the lungs daily as needed (Wheezing). ) 1 Inhaler 6  . Cyanocobalamin (VITAMIN B-12 PO) Take by mouth.    . DULoxetine (CYMBALTA) 60 MG capsule Take 1 capsule (60 mg total) by mouth 2 (two) times daily. 180 capsule 4  . gabapentin (NEURONTIN) 300 MG capsule TAKE 1 CAPSULE(300 MG) BY  MOUTH THREE TIMES DAILY 270 capsule 4  . HUMALOG KWIKPEN 100 UNIT/ML KwikPen INJECT 15 TO 26 UNITS UNDER THE SKIN THREE TIMES DAILY 45 mL 1  . Insulin Pen Needle (B-D UF III MINI PEN NEEDLES) 31G X 5 MM MISC Use with insulin pen 4 times a day to inject insulin 360 each 11  . LEVEMIR FLEXTOUCH 100 UNIT/ML Pen INJECT 17 UNITS UNDER THE SKIN IN THE MORNING AND 28 UNITS IN THE EVENING 45 mL 3   No current facility-administered medications on file prior to visit.    Past Medical History:  Diagnosis Date  . Cancer ([place]) dx'd 09/2013   Lung ca  . Concussion 2018  . Diabetes ([place]) 09/08/2016   type I diabetic patient reported on 09/13/16   . Diabetes mellitus    insulin  . Diabetic neuropathy ([place]) 09/08/2016  . Fall 2018  . GERD (gastroesophageal reflux disease)    no meds for  . History of colitis   . History of hiatal hernia   . Hx of radiation therapy 12/16/13-01/30/14   lung 66Gy  . Hypertension   . Malignant neoplasm of right upper lobe of lung ([place]) 11/28/2013  . Neuropathy   . Psoriasis   . Psoriatic arthritis ([place]) 09/08/2016  . Rheumatoid arthritis ([place])   . Shortness of breath dyspnea    with exertion    Past Surgical History:  Procedure Laterality Date  . ABDOMINAL HYSTERECTOMY    . ABDOMINAL SURGERY    . BALLOON DILATION N/A 12/12/2014   Procedure: BALLOON DILATION;  Surgeon: Inda Castle, MD;  Location: [facility];  Service: Endoscopy;  Laterality: N/A;  . CHOLECYSTECTOMY    . COLONOSCOPY N/A 02/07/2016   Procedure: COLONOSCOPY;  Surgeon: Doran Stabler, MD;  Location: [facility] ENDOSCOPY;  Service: Endoscopy;  Laterality: N/A;  . DILATION AND CURETTAGE OF UTERUS    . ESOPHAGOGASTRODUODENOSCOPY N/A 12/12/2014   Procedure: ESOPHAGOGASTRODUODENOSCOPY (EGD);  Surgeon: Inda Castle, MD;  Location: [facility];  Service: Endoscopy;  Laterality: N/A;  with dilation  . ESOPHAGOGASTRODUODENOSCOPY (EGD) WITH PROPOFOL N/A 10/09/2014   Procedure: ESOPHAGOGASTRODUODENOSCOPY (EGD)  WITH PROPOFOL;  Surgeon: Inda Castle, MD;  Location: [facility];  Service: Endoscopy;  Laterality: N/A;  . ESOPHAGOGASTRODUODENOSCOPY (EGD) WITH PROPOFOL N/A 09/19/2016   Procedure: ESOPHAGOGASTRODUODENOSCOPY (EGD) WITH PROPOFOL;  Surgeon: Doran Stabler, MD;  Location: [facility] ENDOSCOPY;  Service: Gastroenterology;  Laterality: N/A;  with savary dil tt  . FRACTURE SURGERY    . ORIF ANKLE FRACTURE Right 07/04/2015   Procedure: OPEN REDUCTION INTERNAL FIXATION (ORIF) ANKLE FRACTURE;  Surgeon: Newt Minion, MD;  Location: [place];  Service: Orthopedics;  Laterality: Right;  OPEN REDUCTION, INTERNAL FIXATION OF RIGHT ANKLE FRACTURE.   [name] ORIF ANKLE FRACTURE Left 02/10/2016   Procedure: OPEN REDUCTION INTERNAL FIXATION (ORIF) ANKLE FRACTURE;  Surgeon: Newt Minion, MD;  Location: [facility];  Service: Orthopedics;  Laterality: Left;  . SAVORY DILATION N/A 10/09/2014   Procedure: SAVORY DILATION;  Surgeon: Inda Castle, MD;  Location: [facility]  ENDOSCOPY;  Service: Endoscopy;  Laterality: N/A;  . SAVORY DILATION N/A 09/19/2016   Procedure: SAVORY DILATION;  Surgeon: Doran Stabler, MD;  Location: [facility] ENDOSCOPY;  Service: Gastroenterology;  Laterality: N/A;  . TONSILLECTOMY      Social History   Socioeconomic History  . Marital status: Divorced    Spouse name: Not on file  . Number of children: 0  . Years of education: MA  . Highest education level: Master's degree (e.g., MA, MS, MEng, MEd, MSW, MBA)  Occupational History  . Occupation: Retired    Fish farm manager: OTHER  Tobacco Use  . Smoking status: Former Smoker    Packs/day: 3.00    Years: 45.00    Pack years: 135.00    Types: Cigarettes    Quit date: 12/05/1997    Years since quitting: 21.9  . Smokeless tobacco: Never Used  Substance and Sexual Activity  . Alcohol use: No    Alcohol/week: 0.0 standard drinks    Comment: rarely   . Drug use: No  . Sexual activity: Not Currently  Other Topics Concern  . Not on file  Social History Narrative     Patient lives at home alone.   Caffeine Use: 1 cups daily   Right handed   Social Determinants of Health   Financial Resource Strain:   . Difficulty of Paying Living Expenses: Not on file  Food Insecurity:   . Worried About Charity fundraiser in the Last Year: Not on file  . Ran Out of Food in the Last Year: Not on file  Transportation Needs:   . Lack of Transportation (Medical): Not on file  . Lack of Transportation (Non-Medical): Not on file  Physical Activity:   . Days of Exercise per Week: Not on file  . Minutes of Exercise per Session: Not on file  Stress:   . Feeling of Stress : Not on file  Social Connections:   . Frequency of Communication with Friends and Family: Not on file  . Frequency of Social Gatherings with Friends and Family: Not on file  . Attends Religious Services: Not on file  . Active Member of Clubs or Organizations: Not on file  . Attends Archivist Meetings: Not on file  . Marital Status: Not on file    Family History  Problem Relation Age of Onset  . Other Mother   . Anuerysm Mother   . Other Father        failure to thrive  . Hypertension Other   . Stroke Other   . Heart attack Other   . Hemachromatosis Other   . Rheum arthritis Other        both sets of grandparents and father  . Migraines Neg Hx     Review of Systems  Constitutional: Negative for chills and fever.  Respiratory: Positive for cough (chronic, stable), shortness of breath (chronic, stable) and wheezing.   Cardiovascular: Positive for leg swelling. Negative for chest pain and palpitations.  Neurological: Positive for headaches (intermittent, transient).       Objective:   Vitals:   11/18/19 1501  BP: 128/76  Pulse: 100  Resp: 16  Temp: 98 F (36.7 C)  SpO2: 96%   BP Readings from Last 3 Encounters:  11/18/19 128/76  08/06/19 129/79  07/10/19 108/72   Wt Readings from Last 3 Encounters:  11/18/19 169 lb (76.7 kg)  08/06/19 172 lb 6.4 oz (78.2 kg)   07/10/19 171 lb (77.6 kg)  Body mass index is 27.28 kg/m.   Physical Exam    Constitutional: Appears well-developed and well-nourished. No distress.  HENT:  Head: Normocephalic and atraumatic.  Neck: Neck supple. No tracheal deviation present. No thyromegaly present.  No cervical lymphadenopathy Cardiovascular: Normal rate, regular rhythm and normal heart sounds.  No murmur heard. No carotid bruit .  Mild bilateral lower extremity edema.   Pulmonary/Chest: Effort normal and breath sounds normal. No respiratory distress. No has no wheezes. No rales.  Skin: Skin is warm and dry. Not diaphoretic.  Psychiatric: Normal mood and affect. Behavior is normal.      Assessment & Plan:    See Problem List for Assessment and Plan of chronic medical problems.    This visit occurred during the SARS-CoV-2 public health emergency.  Safety protocols were in place, including screening questions prior to the visit, additional usage of staff PPE, and extensive cleaning of exam room while observing appropriate contact time as indicated for disinfecting solutions.

## 2019-11-18 ENCOUNTER — Encounter: Payer: Self-pay | Admitting: Internal Medicine

## 2019-11-18 ENCOUNTER — Ambulatory Visit: Payer: Medicare PPO | Admitting: Internal Medicine

## 2019-11-18 ENCOUNTER — Other Ambulatory Visit: Payer: Self-pay

## 2019-11-18 VITALS — BP 128/76 | HR 100 | Temp 98.0°F | Resp 16 | Ht 66.0 in | Wt 169.0 lb

## 2019-11-18 DIAGNOSIS — R519 Headache, unspecified: Secondary | ICD-10-CM

## 2019-11-18 DIAGNOSIS — E876 Hypokalemia: Secondary | ICD-10-CM

## 2019-11-18 DIAGNOSIS — E139 Other specified diabetes mellitus without complications: Secondary | ICD-10-CM

## 2019-11-18 DIAGNOSIS — T451X5A Adverse effect of antineoplastic and immunosuppressive drugs, initial encounter: Secondary | ICD-10-CM | POA: Diagnosis not present

## 2019-11-18 DIAGNOSIS — I10 Essential (primary) hypertension: Secondary | ICD-10-CM

## 2019-11-18 DIAGNOSIS — G620 Drug-induced polyneuropathy: Secondary | ICD-10-CM | POA: Diagnosis not present

## 2019-11-18 LAB — COMPREHENSIVE METABOLIC PANEL
ALT: 11 U/L (ref 0–35)
AST: 13 U/L (ref 0–37)
Albumin: 3.8 g/dL (ref 3.5–5.2)
Alkaline Phosphatase: 136 U/L — ABNORMAL HIGH (ref 39–117)
BUN: 20 mg/dL (ref 6–23)
CO2: 28 mEq/L (ref 19–32)
Calcium: 9.5 mg/dL (ref 8.4–10.5)
Chloride: 98 mEq/L (ref 96–112)
Creatinine, Ser: 1.09 mg/dL (ref 0.40–1.20)
GFR: 48.55 mL/min — ABNORMAL LOW (ref >60.00)
Glucose, Bld: 240 mg/dL — ABNORMAL HIGH (ref 70–99)
Potassium: 3.6 mEq/L (ref 3.5–5.1)
Sodium: 136 mEq/L (ref 135–145)
Total Bilirubin: 0.4 mg/dL (ref 0.2–1.2)
Total Protein: 7.2 g/dL (ref 6.0–8.3)

## 2019-11-18 LAB — HEMOGLOBIN A1C: Hgb A1c MFr Bld: 9.4 % — ABNORMAL HIGH (ref 4.6–6.5)

## 2019-11-18 MED ORDER — AMLODIPINE BESYLATE 10 MG PO TABS: ORAL_TABLET | ORAL | 1 refills | Status: DC

## 2019-11-18 NOTE — Assessment & Plan Note (Signed)
Sounds to be nerve pain Following with Dr. Loman [place] neurology Taking gabapentin, duloxetine

## 2019-11-18 NOTE — Assessment & Plan Note (Signed)
Chronic Neuropathy secondary to chemotherapy Following with Dr. Lavell Anchors Taking duloxetine and gabapentin

## 2019-11-18 NOTE — Assessment & Plan Note (Signed)
Chronic BP well controlled Current regimen effective and well tolerated Continue current medications at current doses-amlodipine 10 mg daily sent to pharmacy CMP

## 2019-11-18 NOTE — Assessment & Plan Note (Signed)
Acute problem related to recent hospitalization-DKA She was given a prescription for potassium when she left the hospital for 15 days, but did not take it We will check CMP-if potassium is low will need potassium supplementation

## 2019-11-18 NOTE — Assessment & Plan Note (Signed)
She is a very poor historian, but it sounds like last month she had an episode of DKA resulting in being hospitalized for 4 days at the hospital in [place] She has not yet followed up with Dr. Cruzita Lederer and I did stress that she needs to do that She is vague about her sugar control, but states that they go into the 200s, but responded quickly to drinking water and her insulin We will check A1c Discussed the importance of keeping her sugars well controlled and following up with endocrine

## 2019-12-25 ENCOUNTER — Ambulatory Visit: Payer: Medicare PPO | Admitting: Gastroenterology

## 2019-12-25 ENCOUNTER — Encounter: Payer: Self-pay | Admitting: Gastroenterology

## 2019-12-25 VITALS — BP 94/60 | HR 96 | Temp 97.4°F | Ht 66.0 in | Wt 172.0 lb

## 2019-12-25 DIAGNOSIS — Z1159 Encounter for screening for other viral diseases: Secondary | ICD-10-CM

## 2019-12-25 DIAGNOSIS — R1310 Dysphagia, unspecified: Secondary | ICD-10-CM

## 2019-12-25 DIAGNOSIS — K222 Esophageal obstruction: Secondary | ICD-10-CM

## 2019-12-25 DIAGNOSIS — R1319 Other dysphagia: Secondary | ICD-10-CM

## 2019-12-25 NOTE — Progress Notes (Signed)
[facility] Progress Note  Chief Complaint: Dysphagia  Subjective  History: Last seen February 2018 for constipation, and previously for ulcerative pancolitis on budesonide.  Seen on colonoscopy April 2017, patchy distribution.  Biopsies consistent with IBD.  Treating that was challenging due to the patient's memory difficulties.  She has longstanding radiation-induced proximal esophageal stricture.  Last endoscopy with wire-guided dilation November 2017, was able to dilate it from 10 to 14 mm.  Megan [name] has recurrence of solid food dysphagia, mostly with bread products.  She cannot be sure, but thinks it has been going on for perhaps a year now.  She does seem to recall having improvement after the previous endoscopic dilation.  Lately having some more coughing and wheezing.  ROS: Cardiovascular:  no chest pain Respiratory: Coughing and wheezing as noted above.  The patient's Past Medical, Family and Social History were reviewed and are on file in the EMR. Megan [name] tells me she was hospitalized in [place] last spring with a very high blood sugar. Primary care mentioned that in their most recent office note.  Objective:  Med list reviewed  Current Outpatient Medications:  .  albuterol (PROVENTIL HFA;VENTOLIN HFA) 108 (90 Base) MCG/ACT inhaler, Inhale 2 puffs into the lungs every 6 (six) hours as needed for wheezing or shortness of breath., Disp: 1 Inhaler, Rfl: 2 .  amLODipine (NORVASC) 10 MG tablet, TAKE 1 TABLET(10 MG) BY MOUTH DAILY, Disp: 90 tablet, Rfl: 1 .  betamethasone dipropionate (DIPROLENE) 0.05 % cream, Apply topically 2 (two) times daily., Disp: 45 g, Rfl: 0 .  budesonide-formoterol (SYMBICORT) 80-4.5 MCG/ACT inhaler, Inhale 2 puffs into the lungs 2 (two) times daily. (Patient taking differently: Inhale 2 puffs into the lungs daily as needed (Wheezing). ), Disp: 1 Inhaler, Rfl: 6 .  DULoxetine (CYMBALTA) 60 MG capsule, Take 1 capsule (60 mg total) by  mouth 2 (two) times daily., Disp: 180 capsule, Rfl: 4 .  gabapentin (NEURONTIN) 300 MG capsule, TAKE 1 CAPSULE(300 MG) BY MOUTH THREE TIMES DAILY, Disp: 270 capsule, Rfl: 4 .  HUMALOG KWIKPEN 100 UNIT/ML KwikPen, INJECT 15 TO 26 UNITS UNDER THE SKIN THREE TIMES DAILY, Disp: 45 mL, Rfl: 1 .  Insulin Pen Needle (B-D UF [name] MINI PEN NEEDLES) 31G X 5 MM MISC, Use with insulin pen 4 times a day to inject insulin, Disp: 360 each, Rfl: 11 .  LEVEMIR FLEXTOUCH 100 UNIT/ML Pen, INJECT 17 UNITS UNDER THE SKIN IN THE MORNING AND 28 UNITS IN THE EVENING, Disp: 45 mL, Rfl: 3   Vital signs in last 24 hrs: Vitals:   12/25/19 1347  BP: 94/60  Pulse: 96  Temp: (!) 97.4 F (36.3 C)    Physical Exam  Normal vocal quality  HEENT: sclera anicteric, oral mucosa moist without lesions  Neck: supple, no thyromegaly, JVD or lymphadenopathy  Cardiac: RRR without murmurs, S1S2 heard, + peripheral edema  Pulm: End expiratory wheezing bilaterally, normal RR and effort noted  Abdomen: soft, no tenderness, with active bowel sounds. No guarding or palpable hepatosplenomegaly.  Skin; warm and dry, no jaundice or rash  Labs:  CMP Latest Ref Rng & Units 11/18/2019 08/02/2019 07/10/2019  Glucose 70 - 99 mg/dL 240(H) 451(H) 250(H)  BUN 6 - 23 mg/dL 20 20 18   Creatinine 0.40 - 1.20 mg/dL 1.09 1.30(H) 0.94  Sodium 135 - 145 mEq/L 136 136 139  Potassium 3.5 - 5.1 mEq/L 3.6 3.7 3.9  Chloride 96 - 112 mEq/L 98 98 98  CO2 19 -  32 mEq/L 28 26 23   Calcium 8.4 - 10.5 mg/dL 9.5 9.9 8.8  Total Protein 6.0 - 8.3 g/dL 7.2 7.3 -  Total Bilirubin 0.2 - 1.2 mg/dL 0.4 0.8 -  Alkaline Phos 39 - 117 U/L 136(H) 161(H) -  AST 0 - 37 U/L 13 12(L) -  ALT 0 - 35 U/L 11 14 -    ___________________________________________ Radiologic studies:   ____________________________________________ Other:   _____________________________________________ @ASSESSMENTPLANBEGIN @ Assessment: Encounter Diagnoses  Name Primary?  .  Esophageal stricture Yes  . Esophageal dysphagia   . Screening for viral disease    Return of dysphagia from radiation induced esophageal stricture.  She needs a repeat upper endoscopy with wire-guided dilation.  Must be done in the hospital based endoscopy lab due to need for fluoroscopy for this wire-guided dilation of a long stricture.  She was agreeable after discussion of procedure and risks.  The benefits and risks of the planned procedure were described in detail with the patient or (when appropriate) their health care proxy.  Risks were outlined as including, but not limited to, bleeding, infection, perforation, adverse medication reaction leading to cardiac or pulmonary decompensation, pancreatitis (if ERCP).  The limitation of incomplete mucosal visualization was also discussed.  No guarantees or warranties were given.  It is somewhat difficult to follow her history given her memory difficulties.  I will message Dr. Quay Burow to occur aware of Briauna's recent respiratory issues to see if an office appointment may be warranted.   30 minutes were spent on this encounter (including chart review, history/exam, counseling/coordination of care, and documentation)  Megan [name]

## 2019-12-25 NOTE — Patient Instructions (Addendum)
If you are age 78 or older, your body mass index should be between 23-30. Your Body mass index is 27.76 kg/m. If this is out of the aforementioned range listed, please consider follow up with your Primary Care Provider.  If you are age 26 or younger, your body mass index should be between 19-25. Your Body mass index is 27.76 kg/m. If this is out of the aformentioned range listed, please consider follow up with your Primary Care Provider.   You have been scheduled for an endoscopy. Please follow written instructions given to you at your visit today. If you use inhalers (even only as needed), please bring them with you on the day of your procedure.  Due to recent COVID-19 restrictions implemented by our local and state authorities and in an effort to keep both patients and staff as safe as possible, our hospital system now requires COVID-19 testing prior to any scheduled hospital procedure. Please go to our [facility] location drive thru testing site ([address] [place], [place] [address]) on 3-18 at  130 pm . There will be multiple testing areas, the first checkpoint being for pre-procedure/surgery testing. Get into the right (yellow) lane that leads to the PAT testing team. You will not be billed at the time of testing but may receive a bill later depending on your insurance. The approximate cost of the test is $100. You must agree to quarantine from the time of your testing until the procedure date on 01-20-2020 . This should include staying at home with ONLY the people you live with. Avoid take-out, grocery store shopping or leaving the house for any non-emergent reason. Failure to have your COVID-19 test done on the date and time you have been scheduled will result in cancellation of procedure. Please call our office at [phone] if you have any questions.    It was a pleasure to see you today!  Dr. Loletha Carrow

## 2019-12-26 DIAGNOSIS — N3020 Other chronic cystitis without hematuria: Secondary | ICD-10-CM | POA: Diagnosis not present

## 2019-12-26 DIAGNOSIS — N3941 Urge incontinence: Secondary | ICD-10-CM | POA: Diagnosis not present

## 2019-12-26 DIAGNOSIS — R351 Nocturia: Secondary | ICD-10-CM | POA: Diagnosis not present

## 2019-12-30 ENCOUNTER — Telehealth: Payer: Self-pay

## 2019-12-30 NOTE — Telephone Encounter (Signed)
Form for Continous Glucose Monitor and Certificate/Letter of Medical Neccescity have been received, completed, and signed by Dr. Cruzita Lederer.  Completed form has been faxed to [facility] with confirmation.

## 2019-12-31 ENCOUNTER — Telehealth: Payer: Self-pay | Admitting: Internal Medicine

## 2019-12-31 NOTE — Telephone Encounter (Signed)
New message:  Patient states she is returning a call from last week. She states she is not sure what it is in regards to. She states he has been out of town. Please advise.

## 2020-01-02 NOTE — Telephone Encounter (Signed)
LVM letting pt know that she was called in regards to lab work back in Jan. They were sent by mail. Let her know if she has any questions or concerns in regards she can give [place] a call back or send a mychart message.

## 2020-01-16 ENCOUNTER — Other Ambulatory Visit (HOSPITAL_COMMUNITY)
Admission: RE | Admit: 2020-01-16 | Discharge: 2020-01-16 | Disposition: A | Discharge status: Home / Self Care | Payer: Medicare PPO | Source: Ambulatory Visit | Attending: Gastroenterology | Admitting: Gastroenterology

## 2020-01-16 DIAGNOSIS — Z20822 Contact with and (suspected) exposure to covid-19: Secondary | ICD-10-CM | POA: Insufficient documentation

## 2020-01-16 DIAGNOSIS — Z01812 Encounter for preprocedural laboratory examination: Secondary | ICD-10-CM | POA: Diagnosis not present

## 2020-01-16 LAB — SARS CORONAVIRUS 2 (TAT 6-24 HRS): SARS Coronavirus 2: NEGATIVE

## 2020-01-20 ENCOUNTER — Other Ambulatory Visit: Payer: Self-pay

## 2020-01-20 ENCOUNTER — Ambulatory Visit (HOSPITAL_COMMUNITY)
Admission: RE | Admit: 2020-01-20 | Discharge: 2020-01-20 | Disposition: A | Discharge status: Home / Self Care | Payer: Medicare PPO | Attending: Gastroenterology | Admitting: Gastroenterology

## 2020-01-20 ENCOUNTER — Encounter (HOSPITAL_COMMUNITY)
Admission: RE | Disposition: A | Discharge status: Home / Self Care | Payer: Self-pay | Source: Home / Self Care | Attending: Gastroenterology

## 2020-01-20 ENCOUNTER — Ambulatory Visit (HOSPITAL_COMMUNITY): Payer: Medicare PPO | Admitting: Anesthesiology

## 2020-01-20 ENCOUNTER — Encounter (HOSPITAL_COMMUNITY): Payer: Self-pay | Admitting: Gastroenterology

## 2020-01-20 ENCOUNTER — Ambulatory Visit (HOSPITAL_COMMUNITY): Payer: Medicare PPO

## 2020-01-20 DIAGNOSIS — K219 Gastro-esophageal reflux disease without esophagitis: Secondary | ICD-10-CM | POA: Insufficient documentation

## 2020-01-20 DIAGNOSIS — E1040 Type 1 diabetes mellitus with diabetic neuropathy, unspecified: Secondary | ICD-10-CM | POA: Diagnosis not present

## 2020-01-20 DIAGNOSIS — R519 Headache, unspecified: Secondary | ICD-10-CM | POA: Insufficient documentation

## 2020-01-20 DIAGNOSIS — K228 Other specified diseases of esophagus: Secondary | ICD-10-CM | POA: Insufficient documentation

## 2020-01-20 DIAGNOSIS — Z794 Long term (current) use of insulin: Secondary | ICD-10-CM | POA: Diagnosis not present

## 2020-01-20 DIAGNOSIS — Z87891 Personal history of nicotine dependence: Secondary | ICD-10-CM | POA: Diagnosis not present

## 2020-01-20 DIAGNOSIS — G709 Myoneural disorder, unspecified: Secondary | ICD-10-CM | POA: Diagnosis not present

## 2020-01-20 DIAGNOSIS — L4050 Arthropathic psoriasis, unspecified: Secondary | ICD-10-CM | POA: Insufficient documentation

## 2020-01-20 DIAGNOSIS — R0600 Dyspnea, unspecified: Secondary | ICD-10-CM | POA: Diagnosis not present

## 2020-01-20 DIAGNOSIS — J449 Chronic obstructive pulmonary disease, unspecified: Secondary | ICD-10-CM | POA: Diagnosis not present

## 2020-01-20 DIAGNOSIS — Z90710 Acquired absence of both cervix and uterus: Secondary | ICD-10-CM | POA: Diagnosis not present

## 2020-01-20 DIAGNOSIS — R1314 Dysphagia, pharyngoesophageal phase: Secondary | ICD-10-CM | POA: Diagnosis not present

## 2020-01-20 DIAGNOSIS — Z888 Allergy status to other drugs, medicaments and biological substances status: Secondary | ICD-10-CM | POA: Insufficient documentation

## 2020-01-20 DIAGNOSIS — K222 Esophageal obstruction: Secondary | ICD-10-CM | POA: Diagnosis not present

## 2020-01-20 DIAGNOSIS — I10 Essential (primary) hypertension: Secondary | ICD-10-CM | POA: Insufficient documentation

## 2020-01-20 DIAGNOSIS — K209 Esophagitis, unspecified without bleeding: Secondary | ICD-10-CM | POA: Diagnosis not present

## 2020-01-20 DIAGNOSIS — K449 Diaphragmatic hernia without obstruction or gangrene: Secondary | ICD-10-CM | POA: Diagnosis not present

## 2020-01-20 DIAGNOSIS — Z85118 Personal history of other malignant neoplasm of bronchus and lung: Secondary | ICD-10-CM | POA: Diagnosis not present

## 2020-01-20 DIAGNOSIS — M069 Rheumatoid arthritis, unspecified: Secondary | ICD-10-CM | POA: Insufficient documentation

## 2020-01-20 DIAGNOSIS — Z923 Personal history of irradiation: Secondary | ICD-10-CM | POA: Insufficient documentation

## 2020-01-20 DIAGNOSIS — Z885 Allergy status to narcotic agent status: Secondary | ICD-10-CM | POA: Diagnosis not present

## 2020-01-20 DIAGNOSIS — Z9049 Acquired absence of other specified parts of digestive tract: Secondary | ICD-10-CM | POA: Insufficient documentation

## 2020-01-20 DIAGNOSIS — R1319 Other dysphagia: Secondary | ICD-10-CM

## 2020-01-20 DIAGNOSIS — R1310 Dysphagia, unspecified: Secondary | ICD-10-CM

## 2020-01-20 HISTORY — PX: SAVORY DILATION: SHX5439

## 2020-01-20 HISTORY — PX: BIOPSY: SHX5522

## 2020-01-20 HISTORY — PX: ESOPHAGOGASTRODUODENOSCOPY (EGD) WITH PROPOFOL: SHX5813

## 2020-01-20 LAB — GLUCOSE, CAPILLARY
Glucose-Capillary: 333 mg/dL — ABNORMAL HIGH (ref 70–99)
Glucose-Capillary: 279 mg/dL — ABNORMAL HIGH (ref 70–99)
Glucose-Capillary: 158 mg/dL — ABNORMAL HIGH (ref 70–99)
Glucose-Capillary: 185 mg/dL — ABNORMAL HIGH (ref 70–99)

## 2020-01-20 SURGERY — ESOPHAGOGASTRODUODENOSCOPY (EGD) WITH PROPOFOL
Anesthesia: Monitor Anesthesia Care

## 2020-01-20 MED ORDER — PROPOFOL 500 MG/50ML IV EMUL
INTRAVENOUS | Status: AC
  Filled 2020-01-20: qty 50

## 2020-01-20 MED ORDER — INSULIN ASPART 100 UNIT/ML ~~LOC~~ SOLN
SUBCUTANEOUS | Status: AC
  Filled 2020-01-20: qty 1

## 2020-01-20 MED ORDER — LIDOCAINE 2% (20 MG/ML) 5 ML SYRINGE
INTRAMUSCULAR | Status: DC | PRN
  Administered 2020-01-20: 100 mg via INTRAVENOUS

## 2020-01-20 MED ORDER — PROPOFOL 500 MG/50ML IV EMUL
INTRAVENOUS | Status: DC | PRN
  Administered 2020-01-20: 125 ug/kg/min via INTRAVENOUS

## 2020-01-20 MED ORDER — INSULIN ASPART 100 UNIT/ML IV SOLN
10.0000 [IU] | Freq: Once | INTRAVENOUS | Status: AC
  Administered 2020-01-20: 10 [IU] via INTRAVENOUS

## 2020-01-20 MED ORDER — INSULIN ASPART 100 UNIT/ML ~~LOC~~ SOLN
10.0000 [IU] | Freq: Once | SUBCUTANEOUS | Status: AC
  Administered 2020-01-20: 10 [IU] via INTRAVENOUS

## 2020-01-20 MED ORDER — LACTATED RINGERS IV SOLN
INTRAVENOUS | Status: DC
  Administered 2020-01-20: 1000 mL via INTRAVENOUS

## 2020-01-20 MED ORDER — PROPOFOL 10 MG/ML IV BOLUS
INTRAVENOUS | Status: AC
  Filled 2020-01-20: qty 20

## 2020-01-20 MED ORDER — PROPOFOL 10 MG/ML IV BOLUS
INTRAVENOUS | Status: DC | PRN
  Administered 2020-01-20 (×2): 20 mg via INTRAVENOUS

## 2020-01-20 MED ORDER — SODIUM CHLORIDE 0.9 % IV SOLN: INTRAVENOUS | Status: DC

## 2020-01-20 SURGICAL SUPPLY — 15 items

## 2020-01-20 NOTE — Anesthesia Postprocedure Evaluation (Signed)
Anesthesia Post Note  Patient: Megan [name]  Procedure(s) Performed: ESOPHAGOGASTRODUODENOSCOPY (EGD) WITH PROPOFOL (N/A ) SAVORY DILATION (N/A ) BIOPSY     Patient location during evaluation: PACU Anesthesia Type: MAC Level of consciousness: awake and alert Pain management: pain level controlled Vital Signs Assessment: post-procedure vital signs reviewed and stable Respiratory status: spontaneous breathing, nonlabored ventilation, respiratory function stable and patient connected to nasal cannula oxygen Cardiovascular status: stable and blood pressure returned to baseline Postop Assessment: no apparent nausea or vomiting Anesthetic complications: no    Last Vitals:  Vitals:   01/20/20 1225 01/20/20 1230  BP:  117/90  Pulse: 87 85  Resp: (!) 25 17  Temp:    SpO2: 98% 96%    Last Pain:  Vitals:   01/20/20 1230  TempSrc:   PainSc: 0-No pain                 Effie Berkshire

## 2020-01-20 NOTE — Op Note (Signed)
[facility] Patient Name: Megan [name] Procedure Date: 01/20/2020 MRN: [id number] Attending MD: Estill Cotta. Loletha Carrow , MD Date of Birth: Feb 27, 1941 CSN: [id number] Age: 79 Admit Type: Outpatient Procedure:                Upper GI endoscopy Indications:              Esophageal dysphagia, For therapy of esophageal                            stricture (XRT stricture, last endoscopic dilation                            Nov 2017) Providers:                Estill Cotta. Loletha Carrow, MD, Jeanella Cara, RN,                            William Dalton, Technician Referring MD:             Billey Gosling, MD Medicines:                Monitored Anesthesia Care Complications:            No immediate complications. Estimated Blood Loss:     Estimated blood loss was minimal. Procedure:                Pre-Anesthesia Assessment:                           - Prior to the procedure, a History and Physical                            was performed, and patient medications and                            allergies were reviewed. The patient's tolerance of                            previous anesthesia was also reviewed. The risks                            and benefits of the procedure and the sedation                            options and risks were discussed with the patient.                            All questions were answered, and informed consent                            was obtained. Prior Anticoagulants: The patient has                            taken no previous anticoagulant or antiplatelet  agents except for aspirin. ASA Grade Assessment:                            III - A patient with severe systemic disease. After                            reviewing the risks and benefits, the patient was                            deemed in satisfactory condition to undergo the                            procedure.                           After obtaining informed  consent, the endoscope was                            passed under direct vision. Throughout the                            procedure, the patient's blood pressure, pulse, and                            oxygen saturations were monitored continuously. The                            GIF-H190 ([id number] Olympus gastroscope was                            introduced through the mouth, and advanced to the                            second part of duodenum. The upper GI endoscopy was                            accomplished without difficulty. The patient                            tolerated the procedure well. Scope In: Scope Out: Findings:      One moderate stenosis was found 25 to 30 cm from the incisors. This       stenosis measured 1 cm (inner diameter) x 5 cm (in length). The stenosis       was traversed (scope just able to pass without resistance). The       appearance had changed from last EGD, now appeared excavated, with       benign-appearing edges. Biopsies were taken with a cold forceps for       histology (both within and at edge of excavated area). A guidewire was       placed under fluoroscopic guidance and the scope was withdrawn. Dilation       was performed with a Savary dilator with moderate resistance at 14 mm.       Endoscopic re-inspection not performed.      The exam of  the esophagus was otherwise normal.      The stomach was normal.      The cardia and gastric fundus were normal on retroflexion.      The examined duodenum was normal. Impression:               - Esophageal stenosis. Biopsied. Dilated.                           - Normal stomach.                           - Normal examined duodenum. Moderate Sedation:      MAC sedation used Recommendation:           - Patient has a contact number available for                            emergencies. The signs and symptoms of potential                            delayed complications were discussed with the                             patient. Return to normal activities tomorrow.                            Written discharge instructions were provided to the                            patient.                           - Clear liquid diet for 2 hours, then resume soft                            diet(indefinitely)                           - Continue present medications.                           - Await pathology results. Procedure Code(s):        --- Professional ---                           [phone], Esophagogastroduodenoscopy, flexible,                            transoral; with insertion of guide wire followed by                            passage of dilator(s) through esophagus over guide                            wire                           60454, 59, Esophagogastroduodenoscopy,  flexible,                            transoral; with biopsy, single or multiple                           74360, Intraluminal dilation of strictures and/or                            obstructions (eg, esophagus), radiological                            supervision and interpretation Diagnosis Code(s):        --- Professional ---                           K22.2, Esophageal obstruction                           R13.14, Dysphagia, pharyngoesophageal phase CPT copyright 2019 [employer] Association. All rights reserved. The codes documented in this report are preliminary and upon coder review may  be revised to meet current compliance requirements. [name], MD 01/20/2020 12:03:01 PM This report has been signed electronically. Number of Addenda: 0

## 2020-01-20 NOTE — Discharge Instructions (Signed)
YOU HAD AN ENDOSCOPIC PROCEDURE TODAY: Refer to the procedure report and other information in the discharge instructions given to you for any specific questions about what was found during the examination. If this information does not answer your questions, please call [place] office at [phone] to clarify.  ° °YOU SHOULD EXPECT: Some feelings of bloating in the abdomen. Passage of more gas than usual. Walking can help get rid of the air that was put into your GI tract during the procedure and reduce the bloating. If you had a lower endoscopy (such as a colonoscopy or flexible sigmoidoscopy) you may notice spotting of blood in your stool or on the toilet paper. Some abdominal soreness may be present for a day or two, also. ° °DIET: Your first meal following the procedure should be a light meal and then it is ok to progress to your normal diet. A half-sandwich or bowl of soup is an example of a good first meal. Heavy or fried foods are harder to digest and may make you feel nauseous or bloated. Drink plenty of fluids but you should avoid alcoholic beverages for 24 hours. If you had a esophageal dilation, please see attached instructions for diet.   ° °ACTIVITY: Your care partner should take you home directly after the procedure. You should plan to take it easy, moving slowly for the rest of the day. You can resume normal activity the day after the procedure however YOU SHOULD NOT DRIVE, use power tools, machinery or perform tasks that involve climbing or major physical exertion for 24 hours (because of the sedation medicines used during the test).  ° °SYMPTOMS TO REPORT IMMEDIATELY: °A gastroenterologist can be reached at any hour. Please call [phone]  for any of the following symptoms:  °Following lower endoscopy (colonoscopy, flexible sigmoidoscopy) °Excessive amounts of blood in the stool  °Significant tenderness, worsening of abdominal pains  °Swelling of the abdomen that is new, acute  °Fever of 100° or  higher  °Following upper endoscopy (EGD, EUS, ERCP, esophageal dilation) °Vomiting of blood or coffee ground material  °New, significant abdominal pain  °New, significant chest pain or pain under the shoulder blades  °Painful or persistently difficult swallowing  °New shortness of breath  °Black, tarry-looking or red, bloody stools ° °FOLLOW UP:  °If any biopsies were taken you will be contacted by phone or by letter within the next 1-3 weeks. Call [phone]  if you have not heard about the biopsies in 3 weeks.  °Please also call with any specific questions about appointments or follow up tests. ° °

## 2020-01-20 NOTE — Progress Notes (Signed)
CBG-158 Dr. Loletha Carrow made aware. He wants her to have some regular soda so her glucose does not drop lower.. Pt planning to have a meal when she leaves. Informed to keep an eye on her glucose once she gets home.

## 2020-01-20 NOTE — Anesthesia Procedure Notes (Signed)
Date/Time: 01/20/2020 11:22 AM Performed by: Sharlette Dense, CRNA

## 2020-01-20 NOTE — Anesthesia Preprocedure Evaluation (Addendum)
Anesthesia Evaluation  Patient identified by MRN, date of birth, ID band Patient awake    Reviewed: Allergy & Precautions, NPO status , Patient's Chart, lab work & pertinent test results  Airway Mallampati: II  TM Distance: >3 FB Neck ROM: Full    Dental  (+) Teeth Intact, Dental Advisory Given   Pulmonary COPD,  COPD inhaler, former smoker,    breath sounds clear to auscultation       Cardiovascular hypertension, Pt. on medications  Rhythm:Regular Rate:Normal     Neuro/Psych  Headaches,  Neuromuscular disease negative psych ROS   GI/Hepatic Neg liver ROS, hiatal hernia, GERD  Medicated,  Endo/Other  diabetes, Type 1, Insulin Dependent  Renal/GU negative Renal ROS  negative genitourinary   Musculoskeletal  (+) Arthritis , Rheumatoid disorders,    Abdominal Normal abdominal exam  (+)   Peds  Hematology negative hematology ROS (+)   Anesthesia Other Findings   Reproductive/Obstetrics                            Anesthesia Physical Anesthesia Plan  ASA: III  Anesthesia Plan: MAC   Post-op Pain Management:    Induction: Intravenous  PONV Risk Score and Plan: 0 and Propofol infusion  Airway Management Planned: Natural Airway and Nasal Cannula  Additional Equipment: None  Intra-op Plan:   Post-operative Plan:   Informed Consent: I have reviewed the patients History and Physical, chart, labs and discussed the procedure including the risks, benefits and alternatives for the proposed anesthesia with the patient or authorized representative who has indicated his/her understanding and acceptance.       Plan Discussed with: CRNA  Anesthesia Plan Comments: (CBG - 330, 10U IV Recheck CBG - 279, 10U IV)      Anesthesia Quick Evaluation

## 2020-01-20 NOTE — Interval H&P Note (Signed)
History and Physical Interval Note:  01/20/2020 11:06 AM  Megan [name]  has presented today for surgery, with the diagnosis of Dysphagia, Esophageal stricture.  The various methods of treatment have been discussed with the patient and family. After consideration of risks, benefits and other options for treatment, the patient has consented to  Procedure(s) with comments: ESOPHAGOGASTRODUODENOSCOPY (EGD) WITH PROPOFOL (N/A) - Fluoro-Savary SAVORY DILATION (N/A) as a surgical intervention.  The patient's history has been reviewed, patient examined, no change in status, stable for surgery.  I have reviewed the patient's chart and labs.  Questions were answered to the patient's satisfaction.    Glucose down to 279, anesthesia to give additional insulin and proceed with EGD  Nelida Meuse III

## 2020-01-20 NOTE — Progress Notes (Signed)
1150 CBG-185 Dr. Smith Megan [name] made aware of CBG post insulin. No new orders and he was satisfied with result.

## 2020-01-20 NOTE — H&P (Signed)
History:  This patient presents for endoscopic testing for dysphagia, therapy of esophageal stricture.  Megan [name] Referring physician: Binnie Rail, MD  Past Medical History: Past Medical History:  Diagnosis Date  . Cancer ([place]) dx'd 09/2013   Lung ca  . Concussion 2018  . Diabetes ([place]) 09/08/2016   type I diabetic patient reported on 09/13/16   . Diabetes mellitus    insulin  . Diabetic neuropathy ([place]) 09/08/2016  . Fall 2018  . GERD (gastroesophageal reflux disease)    no meds for  . History of colitis   . History of hiatal hernia   . Hx of radiation therapy 12/16/13-01/30/14   lung 66Gy  . Hypertension   . Malignant neoplasm of right upper lobe of lung ([place]) 11/28/2013  . Neuropathy   . Psoriasis   . Psoriatic arthritis ([place]) 09/08/2016  . Rheumatoid arthritis ([place])   . Shortness of breath dyspnea    with exertion     Past Surgical History: Past Surgical History:  Procedure Laterality Date  . ABDOMINAL HYSTERECTOMY    . ABDOMINAL SURGERY    . BALLOON DILATION N/A 12/12/2014   Procedure: BALLOON DILATION;  Surgeon: Inda Castle, MD;  Location: [facility];  Service: Endoscopy;  Laterality: N/A;  . CHOLECYSTECTOMY    . COLONOSCOPY N/A 02/07/2016   Procedure: COLONOSCOPY;  Surgeon: Doran Stabler, MD;  Location: [facility] END[facility]COPY;  Service: Endoscopy;  Laterality: N/A;  . DILATION AND CURETTAGE OF UTERUS    . ESOPHAGOGASTRODUODENOSCOPY N/A 12/12/2014   Procedure: ESOPHAGOGASTRODUODENOSCOPY (EGD);  Surgeon: Inda Castle, MD;  Location: [facility];  Service: Endoscopy;  Laterality: N/A;  with dilation  . ESOPHAGOGASTRODUODENOSCOPY (EGD) WITH PROPOFOL N/A 10/09/2014   Procedure: ESOPHAGOGASTRODUODENOSCOPY (EGD) WITH PROPOFOL;  Surgeon: Inda Castle, MD;  Location: [place];  Service: Endoscopy;  Laterality: N/A;  . ESOPHAGOGASTRODUODENOSCOPY (EGD) WITH PROPOFOL N/A 09/19/2016   Procedure: ESOPHAGOGASTRODUODENOSCOPY (EGD) WITH PROPOFOL;  Surgeon: Doran Stabler, MD;  Location: [facility];  Service: Gastroenterology;  Laterality: N/A;  with savary dil tt  . ORIF ANKLE FRACTURE Right 07/04/2015   Procedure: OPEN REDUCTION INTERNAL FIXATION (ORIF) ANKLE FRACTURE;  Surgeon: Newt Minion, MD;  Location: [place];  Service: Orthopedics;  Laterality: Right;  OPEN REDUCTION, INTERNAL FIXATION OF RIGHT ANKLE FRACTURE.   [name] ORIF ANKLE FRACTURE Left 02/10/2016   Procedure: OPEN REDUCTION INTERNAL FIXATION (ORIF) ANKLE FRACTURE;  Surgeon: Newt Minion, MD;  Location: [place];  Service: Orthopedics;  Laterality: Left;  . SAVORY DILATION N/A 10/09/2014   Procedure: SAVORY DILATION;  Surgeon: Inda Castle, MD;  Location: [place];  Service: Endoscopy;  Laterality: N/A;  . SAVORY DILATION N/A 09/19/2016   Procedure: SAVORY DILATION;  Surgeon: Doran Stabler, MD;  Location: [facility];  Service: Gastroenterology;  Laterality: N/A;  . TONSILLECTOMY      Allergies: Allergies  Allergen Reactions  . Carboplatin Anaphylaxis, Shortness Of Breath and Other (See Comments)    Neuropathy  . Remicade [Infliximab] Anaphylaxis  . Rifampin Other (See Comments)    unknown  . Hydromorphone Rash    Outpatient Meds: Current Facility-Administered Medications  Medication Dose Route Frequency Provider Last Rate Last Admin  . 0.9 %  sodium chloride infusion   Intravenous Continuous Nelida Meuse III, MD      . lactated ringers infusion   Intravenous Continuous Nelida Meuse III, MD 10 mL/hr at 01/20/20 1006 1,000 mL at 01/20/20 1006  ___________________________________________________________________ Objective   Exam:  BP 131/81   Pulse 97   Temp 97.7 F (36.5 C) (Oral)   Resp 18   Ht 5\' 6"  (1.676 m)   Wt 75.3 kg   SpO2 97%   BMI 26.79 kg/m    CV: RRR without murmur, S1/S2, no JVD, no peripheral edema  Resp: clear to auscultation bilaterally, normal RR and effort noted  GI: soft, no tenderness, with active bowel sounds. No guarding or  palpable organomegaly noted.  Neuro: awake, alert and oriented x 3. Normal gross motor function and fluent speech  FSBS 333 pre-op  Assessment:  Dysphagia  Plan:  EGD with wire-guided stricture dilation. Case discussed with anesthesia MD.  IV insulin will be given, procedure performed later this AM when glucose improved.   Nelida Meuse III

## 2020-01-20 NOTE — Transfer of Care (Signed)
Immediate Anesthesia Transfer of Care Note  Patient: Megan [name]  Procedure(s) Performed: ESOPHAGOGASTRODUODENOSCOPY (EGD) WITH PROPOFOL (N/A ) SAVORY DILATION (N/A ) BIOPSY  Patient Location: Endoscopy Unit  Anesthesia Type:MAC  Level of Consciousness: drowsy  Airway & Oxygen Therapy: Patient Spontanous Breathing and Patient connected to face mask oxygen  Post-op Assessment: Report given to RN and Post -op Vital signs reviewed and stable  Post vital signs: Reviewed and stable  Last Vitals:  Vitals Value Taken Time  BP    Temp    Pulse    Resp 14 01/20/20 1203  SpO2    Vitals shown include unvalidated device data.  Last Pain:  Vitals:   01/20/20 0958  TempSrc: Oral  PainSc: 0-No pain         Complications: No apparent anesthesia complications

## 2020-01-21 ENCOUNTER — Encounter: Payer: Self-pay | Admitting: *Deleted

## 2020-01-22 ENCOUNTER — Other Ambulatory Visit: Payer: Self-pay

## 2020-01-22 LAB — SURGICAL PATHOLOGY

## 2020-01-23 ENCOUNTER — Encounter: Payer: Self-pay | Admitting: Gastroenterology

## 2020-01-31 ENCOUNTER — Other Ambulatory Visit: Payer: Self-pay | Admitting: Internal Medicine

## 2020-02-03 ENCOUNTER — Inpatient Hospital Stay: Payer: Medicare PPO | Attending: Internal Medicine

## 2020-02-03 ENCOUNTER — Other Ambulatory Visit: Payer: Self-pay

## 2020-02-03 ENCOUNTER — Encounter (HOSPITAL_COMMUNITY): Payer: Self-pay

## 2020-02-03 ENCOUNTER — Ambulatory Visit (HOSPITAL_COMMUNITY)
Admission: RE | Admit: 2020-02-03 | Discharge: 2020-02-03 | Disposition: A | Discharge status: Home / Self Care | Payer: Medicare PPO | Source: Ambulatory Visit | Attending: Internal Medicine | Admitting: Internal Medicine

## 2020-02-03 DIAGNOSIS — J90 Pleural effusion, not elsewhere classified: Secondary | ICD-10-CM | POA: Insufficient documentation

## 2020-02-03 DIAGNOSIS — Z85118 Personal history of other malignant neoplasm of bronchus and lung: Secondary | ICD-10-CM | POA: Insufficient documentation

## 2020-02-03 DIAGNOSIS — K219 Gastro-esophageal reflux disease without esophagitis: Secondary | ICD-10-CM | POA: Insufficient documentation

## 2020-02-03 DIAGNOSIS — I10 Essential (primary) hypertension: Secondary | ICD-10-CM | POA: Diagnosis not present

## 2020-02-03 DIAGNOSIS — Z9221 Personal history of antineoplastic chemotherapy: Secondary | ICD-10-CM | POA: Diagnosis not present

## 2020-02-03 DIAGNOSIS — Z79899 Other long term (current) drug therapy: Secondary | ICD-10-CM | POA: Diagnosis not present

## 2020-02-03 DIAGNOSIS — Z923 Personal history of irradiation: Secondary | ICD-10-CM | POA: Insufficient documentation

## 2020-02-03 DIAGNOSIS — G629 Polyneuropathy, unspecified: Secondary | ICD-10-CM | POA: Diagnosis not present

## 2020-02-03 DIAGNOSIS — E1040 Type 1 diabetes mellitus with diabetic neuropathy, unspecified: Secondary | ICD-10-CM | POA: Insufficient documentation

## 2020-02-03 DIAGNOSIS — M069 Rheumatoid arthritis, unspecified: Secondary | ICD-10-CM | POA: Diagnosis not present

## 2020-02-03 DIAGNOSIS — K769 Liver disease, unspecified: Secondary | ICD-10-CM | POA: Diagnosis not present

## 2020-02-03 DIAGNOSIS — C3490 Malignant neoplasm of unspecified part of unspecified bronchus or lung: Secondary | ICD-10-CM | POA: Insufficient documentation

## 2020-02-03 LAB — CMP (CANCER CENTER ONLY)
ALT: 19 U/L (ref 0–44)
AST: 16 U/L (ref 15–41)
Albumin: 3.4 g/dL — ABNORMAL LOW (ref 3.5–5.0)
Alkaline Phosphatase: 136 U/L — ABNORMAL HIGH (ref 38–126)
Anion gap: 11 (ref 5–15)
BUN: 16 mg/dL (ref 8–23)
CO2: 29 mmol/L (ref 22–32)
Calcium: 9.2 mg/dL (ref 8.9–10.3)
Chloride: 100 mmol/L (ref 98–111)
Creatinine: 1.09 mg/dL — ABNORMAL HIGH (ref 0.44–1.00)
GFR, Est AFR Am: 56 mL/min — ABNORMAL LOW (ref >60)
GFR, Estimated: 49 mL/min — ABNORMAL LOW (ref >60)
Glucose, Bld: 305 mg/dL — ABNORMAL HIGH (ref 70–99)
Potassium: 4.2 mmol/L (ref 3.5–5.1)
Sodium: 140 mmol/L (ref 135–145)
Total Bilirubin: 0.5 mg/dL (ref 0.3–1.2)
Total Protein: 6.6 g/dL (ref 6.5–8.1)

## 2020-02-03 LAB — CBC WITH DIFFERENTIAL (CANCER CENTER ONLY)
Abs Immature Granulocytes: 0.12 10*3/uL — ABNORMAL HIGH (ref 0.00–0.07)
Basophils Absolute: 0.1 10*3/uL (ref 0.0–0.1)
Basophils Relative: 1 %
Eosinophils Absolute: 0.4 10*3/uL (ref 0.0–0.5)
Eosinophils Relative: 5 %
HCT: 44.1 % (ref 36.0–46.0)
Hemoglobin: 14.4 g/dL (ref 12.0–15.0)
Immature Granulocytes: 2 %
Lymphocytes Relative: 17 %
Lymphs Abs: 1.4 10*3/uL (ref 0.7–4.0)
MCH: 29.8 pg (ref 26.0–34.0)
MCHC: 32.7 g/dL (ref 30.0–36.0)
MCV: 91.3 fL (ref 80.0–100.0)
Monocytes Absolute: 0.5 10*3/uL (ref 0.1–1.0)
Monocytes Relative: 7 %
Neutro Abs: 5.6 10*3/uL (ref 1.7–7.7)
Neutrophils Relative %: 68 %
Platelet Count: 208 10*3/uL (ref 150–400)
RBC: 4.83 MIL/uL (ref 3.87–5.11)
RDW: 15 % (ref 11.5–15.5)
WBC Count: 8.2 10*3/uL (ref 4.0–10.5)
nRBC: 0 % (ref 0.0–0.2)

## 2020-02-03 MED ORDER — IOHEXOL 300 MG/ML  SOLN
75.0000 mL | Freq: Once | INTRAMUSCULAR | Status: AC | PRN
  Administered 2020-02-03: 13:00:00 75 mL via INTRAVENOUS

## 2020-02-03 MED ORDER — SODIUM CHLORIDE (PF) 0.9 % IJ SOLN
INTRAMUSCULAR | Status: AC
  Filled 2020-02-03: qty 50

## 2020-02-05 ENCOUNTER — Encounter: Payer: Self-pay | Admitting: Internal Medicine

## 2020-02-05 ENCOUNTER — Other Ambulatory Visit: Payer: Self-pay

## 2020-02-05 ENCOUNTER — Inpatient Hospital Stay: Payer: Medicare PPO | Admitting: Internal Medicine

## 2020-02-05 ENCOUNTER — Telehealth: Payer: Self-pay | Admitting: Internal Medicine

## 2020-02-05 VITALS — BP 129/78 | HR 108 | Temp 98.3°F | Resp 17 | Ht 66.0 in | Wt 166.1 lb

## 2020-02-05 DIAGNOSIS — K219 Gastro-esophageal reflux disease without esophagitis: Secondary | ICD-10-CM | POA: Diagnosis not present

## 2020-02-05 DIAGNOSIS — C3411 Malignant neoplasm of upper lobe, right bronchus or lung: Secondary | ICD-10-CM

## 2020-02-05 DIAGNOSIS — E0811 Diabetes mellitus due to underlying condition with ketoacidosis with coma: Secondary | ICD-10-CM

## 2020-02-05 DIAGNOSIS — Z9221 Personal history of antineoplastic chemotherapy: Secondary | ICD-10-CM | POA: Diagnosis not present

## 2020-02-05 DIAGNOSIS — C3490 Malignant neoplasm of unspecified part of unspecified bronchus or lung: Secondary | ICD-10-CM | POA: Diagnosis not present

## 2020-02-05 DIAGNOSIS — G629 Polyneuropathy, unspecified: Secondary | ICD-10-CM | POA: Diagnosis not present

## 2020-02-05 DIAGNOSIS — I10 Essential (primary) hypertension: Secondary | ICD-10-CM

## 2020-02-05 DIAGNOSIS — M069 Rheumatoid arthritis, unspecified: Secondary | ICD-10-CM | POA: Diagnosis not present

## 2020-02-05 DIAGNOSIS — Z85118 Personal history of other malignant neoplasm of bronchus and lung: Secondary | ICD-10-CM | POA: Diagnosis not present

## 2020-02-05 DIAGNOSIS — J90 Pleural effusion, not elsewhere classified: Secondary | ICD-10-CM | POA: Diagnosis not present

## 2020-02-05 DIAGNOSIS — K769 Liver disease, unspecified: Secondary | ICD-10-CM | POA: Diagnosis not present

## 2020-02-05 DIAGNOSIS — E1040 Type 1 diabetes mellitus with diabetic neuropathy, unspecified: Secondary | ICD-10-CM | POA: Diagnosis not present

## 2020-02-05 NOTE — Telephone Encounter (Signed)
Scheduled per los. Gave avs and calendar  

## 2020-02-05 NOTE — Progress Notes (Signed)
[place] [phone]   [phone]  OFFICE PROGRESS NOTE  Binnie Rail, MD [place] [address]  DIAGNOSIS: Squamous cell lung cancer  Primary site: Lung (Right)  Staging method: AJCC 7th Edition  Clinical: Stage IIIB (T3, N3, M0)  Summary: Stage IIIB (T3, N3, M0)   PRIOR THERAPY:  1) Concurrent chemoradiation with weekly chemotherapy in the form of carboplatin for AUC of 2 and paclitaxel 45 mg/m2. Status post 7 week of treatment. Last dose was given 01/27/2014 no significant response in her disease.  2) Consolidation chemotherapy with carboplatin for AUC of 5 and paclitaxel 175 mg/M2 every 3 weeks with Neulasta support. First dose on 04/15/2014. Status post 3 cycles. Carboplatin was discontinued starting cycle #2 secondary to hypersensitivity reaction. 3) Immunotherapy with Opdivo (Nivolumab) 3mg /kg given every 2 weeks. Status post 33 cycles discontinued secondary to intolerance.  CURRENT THERAPY: Observation.  INTERVAL HISTORY: Megan [name] 80 y.o. female returns to the clinic today for 6 months follow-up visit.  The patient is feeling fine today with no concerning complaints.  Her blood sugar was high and she does not check it at regular basis.  The patient denied having any current chest pain, shortness of breath, cough or hemoptysis.  She denied having any fever or chills.  She has no nausea, vomiting, diarrhea or constipation.  She denied having any headache or visual changes.  She is here today for evaluation with repeat CT scan of the chest for restaging of her disease.  MEDICAL HISTORY: Past Medical History:  Diagnosis Date  . Cancer ([place]) dx'd 09/2013   Lung ca  . Concussion 2018  . Diabetes ([place]) 09/08/2016   type I diabetic patient reported on 09/13/16   . Diabetes mellitus    insulin  . Diabetic neuropathy ([place]) 09/08/2016  . Fall 2018  . GERD (gastroesophageal reflux disease)    no meds  for  . History of colitis   . History of hiatal hernia   . Hx of radiation therapy 12/16/13-01/30/14   lung 66Gy  . Hypertension   . Malignant neoplasm of right upper lobe of lung ([place]) 11/28/2013  . Neuropathy   . Psoriasis   . Psoriatic arthritis ([place]) 09/08/2016  . Rheumatoid arthritis ([place])   . Shortness of breath dyspnea    with exertion    ALLERGIES:  is allergic to carboplatin; remicade [infliximab]; rifampin; and hydromorphone.  MEDICATIONS:  Current Outpatient Medications  Medication Sig Dispense Refill  . albuterol (PROVENTIL HFA;VENTOLIN HFA) 108 (90 Base) MCG/ACT inhaler Inhale 2 puffs into the lungs every 6 (six) hours as needed for wheezing or shortness of breath. 1 Inhaler 2  . amLODipine (NORVASC) 10 MG tablet TAKE 1 TABLET(10 MG) BY MOUTH DAILY (Patient taking differently: Take 10 mg by mouth daily. ) 90 tablet 1  . betamethasone dipropionate (DIPROLENE) 0.05 % cream Apply topically 2 (two) times daily. (Patient taking differently: Apply 1 application topically 2 (two) times daily. ) 45 g 0  . budesonide-formoterol (SYMBICORT) 80-4.5 MCG/ACT inhaler Inhale 2 puffs into the lungs 2 (two) times daily. (Patient taking differently: Inhale 2 puffs into the lungs daily as needed (Wheezing). ) 1 Inhaler 6  . DULoxetine (CYMBALTA) 60 MG capsule Take 1 capsule (60 mg total) by mouth 2 (two) times daily. (Patient taking differently: Take 60 mg by mouth daily. ) 180 capsule 4  . famotidine (PEPCID) 40 MG tablet Take 40 mg by mouth daily as  needed for heartburn.     . gabapentin (NEURONTIN) 300 MG capsule TAKE 1 CAPSULE(300 MG) BY MOUTH THREE TIMES DAILY (Patient taking differently: Take 300 mg by mouth 3 (three) times daily. ) 270 capsule 4  . glucagon 1 MG injection Inject 1 mg into the skin once.    . insulin aspart (NOVOLOG FLEXPEN) 100 UNIT/ML FlexPen INJECT 15 TO 26 UNITS UNDER THE SKIN THREE TIMES DAILY 15 mL 1  . Insulin Pen Needle (B-D UF III MINI PEN NEEDLES) 31G X 5 MM MISC  Use with insulin pen 4 times a day to inject insulin 360 each 11  . LEVEMIR FLEXTOUCH 100 UNIT/ML Pen INJECT 17 UNITS UNDER THE SKIN IN THE MORNING AND 28 UNITS IN THE EVENING (Patient taking differently: Inject 7-12 Units into the skin at bedtime. Take 7 units in the morning and 27 units in the evening) 45 mL 3  . solifenacin (VESICARE) 10 MG tablet Take 10 mg by mouth daily.     No current facility-administered medications for this visit.    SURGICAL HISTORY:  Past Surgical History:  Procedure Laterality Date  . ABDOMINAL HYSTERECTOMY    . ABDOMINAL SURGERY    . BALLOON DILATION N/A 12/12/2014   Procedure: BALLOON DILATION;  Surgeon: Inda Castle, MD;  Location: [facility];  Service: Endoscopy;  Laterality: N/A;  . BIOPSY  01/20/2020   Procedure: BIOPSY;  Surgeon: Doran Stabler, MD;  Location: [facility];  Service: Gastroenterology;;  . CHOLECYSTECTOMY    . COLONOSCOPY N/A 02/07/2016   Procedure: COLONOSCOPY;  Surgeon: Doran Stabler, MD;  Location: [facility] END[facility]COPY;  Service: Endoscopy;  Laterality: N/A;  . DILATION AND CURETTAGE OF UTERUS    . ESOPHAGOGASTRODUODENOSCOPY N/A 12/12/2014   Procedure: ESOPHAGOGASTRODUODENOSCOPY (EGD);  Surgeon: Inda Castle, MD;  Location: [facility];  Service: Endoscopy;  Laterality: N/A;  with dilation  . ESOPHAGOGASTRODUODENOSCOPY (EGD) WITH PROPOFOL N/A 10/09/2014   Procedure: ESOPHAGOGASTRODUODENOSCOPY (EGD) WITH PROPOFOL;  Surgeon: Inda Castle, MD;  Location: [facility];  Service: Endoscopy;  Laterality: N/A;  . ESOPHAGOGASTRODUODENOSCOPY (EGD) WITH PROPOFOL N/A 09/19/2016   Procedure: ESOPHAGOGASTRODUODENOSCOPY (EGD) WITH PROPOFOL;  Surgeon: Doran Stabler, MD;  Location: [facility];  Service: Gastroenterology;  Laterality: N/A;  with savary dil tt  . ESOPHAGOGASTRODUODENOSCOPY (EGD) WITH PROPOFOL N/A 01/20/2020   Procedure: ESOPHAGOGASTRODUODENOSCOPY (EGD) WITH PROPOFOL;  Surgeon: Doran Stabler, MD;  Location: [facility];  Service: Gastroenterology;  Laterality: N/A;  Fluoro-[place]  . ORIF ANKLE FRACTURE Right 07/04/2015   Procedure: OPEN REDUCTION INTERNAL FIXATION (ORIF) ANKLE FRACTURE;  Surgeon: Newt Minion, MD;  Location: [facility];  Service: Orthopedics;  Laterality: Right;  OPEN REDUCTION, INTERNAL FIXATION OF RIGHT ANKLE FRACTURE.   [name] ORIF ANKLE FRACTURE Left 02/10/2016   Procedure: OPEN REDUCTION INTERNAL FIXATION (ORIF) ANKLE FRACTURE;  Surgeon: Newt Minion, MD;  Location: [facility];  Service: Orthopedics;  Laterality: Left;  . SAVORY DILATION N/A 10/09/2014   Procedure: SAVORY DILATION;  Surgeon: Inda Castle, MD;  Location: [facility];  Service: Endoscopy;  Laterality: N/A;  . SAVORY DILATION N/A 09/19/2016   Procedure: SAVORY DILATION;  Surgeon: Doran Stabler, MD;  Location: [facility];  Service: Gastroenterology;  Laterality: N/A;  . SAVORY DILATION N/A 01/20/2020   Procedure: SAVORY DILATION;  Surgeon: Doran Stabler, MD;  Location: [facility];  Service: Gastroenterology;  Laterality: N/A;  . TONSILLECTOMY      REVIEW OF SYSTEMS:  A comprehensive review  of systems was negative.   PHYSICAL EXAMINATION: General appearance: alert, cooperative and no distress Head: Normocephalic, without obvious abnormality, atraumatic Neck: no adenopathy, no JVD, supple, symmetrical, trachea midline and thyroid not enlarged, symmetric, no tenderness/mass/nodules Lymph nodes: Cervical, supraclavicular, and axillary nodes normal. Resp: clear to auscultation bilaterally Back: symmetric, no curvature. ROM normal. No CVA tenderness. Cardio: regular rate and rhythm, S1, S2 normal, no murmur, click, rub or gallop GI: soft, non-tender; bowel sounds normal; no masses,  no organomegaly Extremities: extremities normal, atraumatic, no cyanosis or edema    ECOG PERFORMANCE STATUS: 1 - Symptomatic but completely ambulatory  Blood pressure 129/78, pulse (!) 108, temperature 98.3 F (36.8 C), temperature  source Temporal, resp. rate 17, height 5\' 6"  (1.676 m), weight 166 lb 1.6 oz (75.3 kg), SpO2 97 %.  LABORATORY DATA: Lab Results  Component Value Date   WBC 8.2 02/03/2020   HGB 14.4 02/03/2020   HCT 44.1 02/03/2020   MCV 91.3 02/03/2020   PLT 208 02/03/2020      Chemistry      Component Value Date/Time   NA 140 02/03/2020 1110   NA 139 07/10/2019 0922   NA 141 09/08/2017 1213   K 4.2 02/03/2020 1110   K 4.1 09/08/2017 1213   CL 100 02/03/2020 1110   CO2 29 02/03/2020 1110   CO2 28 09/08/2017 1213   BUN 16 02/03/2020 1110   BUN 18 07/10/2019 0922   BUN 13.8 09/08/2017 1213   CREATININE 1.09 (H) 02/03/2020 1110   CREATININE 0.9 09/08/2017 1213   GLU 176 03/07/2016 0000      Component Value Date/Time   CALCIUM 9.2 02/03/2020 1110   CALCIUM 9.4 09/08/2017 1213   ALKPHOS 136 (H) 02/03/2020 1110   ALKPHOS 182 (H) 09/08/2017 1213   AST 16 02/03/2020 1110   AST 15 09/08/2017 1213   ALT 19 02/03/2020 1110   ALT 17 09/08/2017 1213   BILITOT 0.5 02/03/2020 1110   BILITOT 0.45 09/08/2017 1213       RADIOGRAPHIC STUDIES: CT Chest W Contrast  Result Date: 02/03/2020 CLINICAL DATA:  History of non-small cell 1 cancer status post systemic therapy and radiation therapy. EXAM: CT CHEST WITH CONTRAST TECHNIQUE: Multidetector CT imaging of the chest was performed during intravenous contrast administration. CONTRAST:  45mL OMNIPAQUE IOHEXOL 300 MG/ML  SOLN COMPARISON:  And 12/20/2018 FINDINGS: Cardiovascular: Calcified and noncalcified plaque in the thoracic aorta without evidence of aneurysm. Small volume pericardial fluid is similar to the prior study. Distortion of the right hilum and central pulmonary vasculature is unchanged. Mediastinum/Nodes: Mid esophageal thickening. Similar to slightly worse than the prior exam. No discrete adenopathy. Soft tissue about the right hilum is unchanged. Lungs/Pleura: Scarring in the right upper lobe an complete volume loss in the right middle lobe  with similar appearance. No pleural nodularity. Trace pleural fluid very slight increased since the previous exam. Best seen in the costodiaphragmatic sulcus posteriorly (image 116, series 2) Calcified granuloma in the left lung base and calcified lymph nodes at the left hilum. Upper Abdomen: Low-density hepatic lesions in the medial and lateral segment of the left hepatic lobe are unchanged. Small low density lesion along the right hepatic margin, incompletely imaged imaged portions are stable. Adrenal glands are normal. Remaining upper abdominal viscera, imaged portions are unremarkable. Musculoskeletal: Spinal degenerative changes without acute or destructive bone finding. IMPRESSION: 1. Stable post radiation changes in the right upper lobe and right middle lobe. 2. Trace right pleural effusion slightly increased from previous  exam. Attention on follow-up, consider short interval follow-up within 3 months to assess further as warranted. 3. Stable to slightly worse mid esophageal thickening, recent EGD is noted. Clinically warranted. 4. Stable low-density lesions in the liver. Aortic Atherosclerosis (ICD10-I70.0). Electronically Signed   By: Zetta Bills M.D.   On: 02/03/2020 13:52   DG ESOPHAGUS DILATION  Result Date: 01/20/2020 ESOPHAGEAL DILATATION: Fluoroscopy was provided for use by the requesting physician.  No images were obtained for radiographic interpretation.   ASSESSMENT AND PLAN:  This is a very pleasant 79 years old white female with metastatic non-small cell lung cancer status post a course of concurrent chemoradiation as well as consolidation chemotherapy discontinued secondary to intolerance and significant neuropathy. The patient was treated with immunotherapy with Nivolumab status post 33 cycles and tolerated the treatment well but it was discontinued secondary to chemotherapy-induced colitis and diarrhea. The patient is currently on observation and has been doing well with no  concerning complaints. She had repeat CT scan of the chest performed recently.  I personally and independently reviewed the scans and discussed the results with the patient today. Her scan showed no concerning findings for disease recurrence or metastasis. I recommended for her to continue on observation with repeat CT scan of the chest in 6 months. For the diabetes mellitus, she will continue to monitor her blood sugar closely at home and discuss with her primary care physician for adjustment of her medication if needed. She was advised to call immediately if she has any concerning symptoms in the interval. The patient voices understanding of current disease status and treatment options and is in agreement with the current care plan. All questions were answered. The patient knows to call the clinic with any problems, questions or concerns. We can certainly see the patient much sooner if necessary.  Disclaimer: This note was dictated with voice recognition software. Similar sounding words can inadvertently be transcribed and may not be corrected upon review.

## 2020-02-07 ENCOUNTER — Other Ambulatory Visit: Payer: Self-pay | Admitting: Internal Medicine

## 2020-02-19 ENCOUNTER — Ambulatory Visit: Payer: Medicare PPO | Admitting: Internal Medicine

## 2020-02-19 ENCOUNTER — Encounter: Payer: Self-pay | Admitting: Internal Medicine

## 2020-02-19 ENCOUNTER — Other Ambulatory Visit: Payer: Self-pay

## 2020-02-19 VITALS — BP 120/78 | HR 107 | Temp 98.8°F | Resp 16 | Ht 66.0 in | Wt 168.0 lb

## 2020-02-19 DIAGNOSIS — T451X5A Adverse effect of antineoplastic and immunosuppressive drugs, initial encounter: Secondary | ICD-10-CM | POA: Diagnosis not present

## 2020-02-19 DIAGNOSIS — S060X0A Concussion without loss of consciousness, initial encounter: Secondary | ICD-10-CM

## 2020-02-19 DIAGNOSIS — L400 Psoriasis vulgaris: Secondary | ICD-10-CM | POA: Diagnosis not present

## 2020-02-19 DIAGNOSIS — G620 Drug-induced polyneuropathy: Secondary | ICD-10-CM | POA: Diagnosis not present

## 2020-02-19 DIAGNOSIS — S060XAA Concussion with loss of consciousness status unknown, initial encounter: Secondary | ICD-10-CM | POA: Insufficient documentation

## 2020-02-19 DIAGNOSIS — S060X9A Concussion with loss of consciousness of unspecified duration, initial encounter: Secondary | ICD-10-CM | POA: Insufficient documentation

## 2020-02-19 NOTE — Patient Instructions (Addendum)
A referral was ordered for Dr Jaynee Eagles.   If your headaches or dizziness get worse or do not improve let me know.  Rest more if you have headaches or dizziness.

## 2020-02-19 NOTE — Assessment & Plan Note (Addendum)
New She fell on Easter-went to pick something up on the driveway and lost her balance and fell-minor injuries to left palm and left knee, which are healing.  She also hit her head and has symptoms of a concussion Experiencing headaches, dizziness No concerning neurological symptoms or findings on exam No confusion and concussion symptoms are improving-no need for imaging at this time Discussed that the headaches and dizziness should improve and that if they do increase she needs to rest more Advised to call with any concerns or if her symptoms do not continue to improve

## 2020-02-19 NOTE — Progress Notes (Signed)
Subjective:    Patient ID: Megan [name], female    DOB: 1941-09-23, 79 y.o.   MRN: [id number]  HPI The patient is here for an acute visit.   She would like a referral for neurology, Dr Jaynee Eagles..  She states she needs to have this-she is not sure if it is for neurology for her insurance.  She has been seeing her for years for neuropathy.  Her neuropathy is worse-is related to having chemotherapy years ago.  She has a history of falls and has fallen several times.  She fell most recently on Easter.  She went to pick something up on the driveway and lost her balance and ended up falling.  She did scrape her left knee and left palm.  She hit her head on the left side.  She has had some mild headaches and dizziness since then.  They have gotten better.  She has noticed that she typically has the headaches and dizziness when she is doing something or is more active, but they improve when she rests.  She does have chronic blurry vision that is intermittent, but has not noticed anything new.  She has not had any nausea    Medications and allergies reviewed with patient and updated if appropriate.  Patient Active Problem List   Diagnosis Date Noted  . Head pain 05/10/2019  . GERD (gastroesophageal reflux disease) 10/17/2018  . CAP (community acquired pneumonia) 10/15/2018  . Post herpetic neuralgia 03/30/2018  . Herpes zoster without complication 20/25/4270  . Right elbow pain 03/01/2018  . Carpal tunnel syndrome on right 11/30/2017  . Cough 08/22/2017  . Hypokalemia 03/22/2017  . Primary osteoarthritis of both hands 03/08/2017  . Poor balance 02/07/2017  . Subarachnoid bleed ([place]) 01/11/2017  . Psoriatic arthropathy ([place]) 09/08/2016  . Hypertension 09/08/2016  . Diabetic neuropathy ([place]) 09/08/2016  . LADA (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), managed as type 1 ([place]) 05/04/2016  . Syncope 03/25/2016  . Malnutrition of moderate degree 02/29/2016  . Diabetic ketoacidosis with  coma associated with diabetes mellitus due to underlying condition ([place])   . RLS (restless legs syndrome) 12/11/2015  . Lumbago 05/06/2015  . Peripheral edema 04/14/2015  . Psoriasis 04/14/2015  . Emphysema of lung ([place]) 11/26/2014  . Radiation-induced esophageal stricture 10/01/2014  . Tachycardia 08/27/2014  . DNR (do not resuscitate) discussion 08/21/2014  . Cancer associated pain 07/29/2014  . Neuropathy due to chemotherapeutic drug ([place]) 07/18/2014  . Leg cramps 07/18/2014  . Thrombocytopenia, unspecified ([place]) 07/18/2014  . Dyspnea 04/28/2014  . Radiation esophagitis 04/10/2014  . Right-sided chest wall pain 04/01/2014  . Cancer of middle lobe of lung ([place]) 01/17/2014  . Malignant neoplasm of right upper lobe of lung ([place]) 11/28/2013    Current Outpatient Medications on File Prior to Visit  Medication Sig Dispense Refill  . albuterol (PROVENTIL HFA;VENTOLIN HFA) 108 (90 Base) MCG/ACT inhaler Inhale 2 puffs into the lungs every 6 (six) hours as needed for wheezing or shortness of breath. 1 Inhaler 2  . amLODipine (NORVASC) 10 MG tablet TAKE 1 TABLET(10 MG) BY MOUTH DAILY (Patient taking differently: Take 10 mg by mouth daily. ) 90 tablet 1  . betamethasone dipropionate (DIPROLENE) 0.05 % cream Apply topically 2 (two) times daily. (Patient taking differently: Apply 1 application topically 2 (two) times daily. ) 45 g 0  . budesonide-formoterol (SYMBICORT) 80-4.5 MCG/ACT inhaler Inhale 2 puffs into the lungs 2 (two) times daily. (Patient taking differently: Inhale 2 puffs into the lungs daily  as needed (Wheezing). ) 1 Inhaler 6  . DULoxetine (CYMBALTA) 60 MG capsule Take 1 capsule (60 mg total) by mouth 2 (two) times daily. (Patient taking differently: Take 60 mg by mouth daily. ) 180 capsule 4  . famotidine (PEPCID) 40 MG tablet Take 40 mg by mouth daily as needed for heartburn.     . gabapentin (NEURONTIN) 300 MG capsule TAKE 1 CAPSULE(300 MG) BY MOUTH THREE TIMES DAILY (Patient  taking differently: Take 300 mg by mouth 3 (three) times daily. ) 270 capsule 4  . glucagon 1 MG injection Inject 1 mg into the skin once.    . insulin aspart (NOVOLOG FLEXPEN) 100 UNIT/ML FlexPen INJECT 15 TO 26 UNITS UNDER THE SKIN THREE TIMES DAILY 15 mL 1  . Insulin Pen Needle (B-D UF III MINI PEN NEEDLES) 31G X 5 MM MISC Use with insulin pen 4 times a day to inject insulin 360 each 11  . LEVEMIR FLEXTOUCH 100 UNIT/ML Pen INJECT 17 UNITS UNDER THE SKIN IN THE MORNING AND 28 UNITS IN THE EVENING (Patient taking differently: Inject 7-12 Units into the skin at bedtime. Take 7 units in the morning and 27 units in the evening) 45 mL 3  . solifenacin (VESICARE) 10 MG tablet Take 10 mg by mouth daily.     No current facility-administered medications on file prior to visit.    Past Medical History:  Diagnosis Date  . Cancer ([place]) dx'd 09/2013   Lung ca  . Concussion 2018  . Diabetes ([place]) 09/08/2016   type I diabetic patient reported on 09/13/16   . Diabetes mellitus    insulin  . Diabetic neuropathy ([place]) 09/08/2016  . Fall 2018  . GERD (gastroesophageal reflux disease)    no meds for  . History of colitis   . History of hiatal hernia   . Hx of radiation therapy 12/16/13-01/30/14   lung 66Gy  . Hypertension   . Malignant neoplasm of right upper lobe of lung ([place]) 11/28/2013  . Neuropathy   . Psoriasis   . Psoriatic arthritis ([place]) 09/08/2016  . Rheumatoid arthritis ([place])   . Shortness of breath dyspnea    with exertion    Past Surgical History:  Procedure Laterality Date  . ABDOMINAL HYSTERECTOMY    . ABDOMINAL SURGERY    . BALLOON DILATION N/A 12/12/2014   Procedure: BALLOON DILATION;  Surgeon: Inda Castle, MD;  Location: [facility];  Service: Endoscopy;  Laterality: N/A;  . BIOPSY  01/20/2020   Procedure: BIOPSY;  Surgeon: Doran Stabler, MD;  Location: [facility];  Service: Gastroenterology;;  . CHOLECYSTECTOMY    . COLONOSCOPY N/A 02/07/2016   Procedure: COLONOSCOPY;   Surgeon: Doran Stabler, MD;  Location: [facility]COPY;  Service: Endoscopy;  Laterality: N/A;  . DILATION AND CURETTAGE OF UTERUS    . ESOPHAGOGASTRODUODENOSCOPY N/A 12/12/2014   Procedure: ESOPHAGOGASTRODUODENOSCOPY (EGD);  Surgeon: Inda Castle, MD;  Location: [facility];  Service: Endoscopy;  Laterality: N/A;  with dilation  . ESOPHAGOGASTRODUODENOSCOPY (EGD) WITH PROPOFOL N/A 10/09/2014   Procedure: ESOPHAGOGASTRODUODENOSCOPY (EGD) WITH PROPOFOL;  Surgeon: Inda Castle, MD;  Location: [facility];  Service: Endoscopy;  Laterality: N/A;  . ESOPHAGOGASTRODUODENOSCOPY (EGD) WITH PROPOFOL N/A 09/19/2016   Procedure: ESOPHAGOGASTRODUODENOSCOPY (EGD) WITH PROPOFOL;  Surgeon: Doran Stabler, MD;  Location: [facility];  Service: Gastroenterology;  Laterality: N/A;  with savary dil tt  . ESOPHAGOGASTRODUODENOSCOPY (EGD) WITH PROPOFOL N/A 01/20/2020   Procedure: ESOPHAGOGASTRODUODENOSCOPY (EGD) WITH PROPOFOL;  Surgeon:  Doran Stabler, MD;  Location: [facility];  Service: Gastroenterology;  Laterality: N/A;  Fluoro-[place]  . ORIF ANKLE FRACTURE Right 07/04/2015   Procedure: OPEN REDUCTION INTERNAL FIXATION (ORIF) ANKLE FRACTURE;  Surgeon: Newt Minion, MD;  Location: [facility];  Service: Orthopedics;  Laterality: Right;  OPEN REDUCTION, INTERNAL FIXATION OF RIGHT ANKLE FRACTURE.   [name] ORIF ANKLE FRACTURE Left 02/10/2016   Procedure: OPEN REDUCTION INTERNAL FIXATION (ORIF) ANKLE FRACTURE;  Surgeon: Newt Minion, MD;  Location: [facility];  Service: Orthopedics;  Laterality: Left;  . SAVORY DILATION N/A 10/09/2014   Procedure: SAVORY DILATION;  Surgeon: Inda Castle, MD;  Location: [facility];  Service: Endoscopy;  Laterality: N/A;  . SAVORY DILATION N/A 09/19/2016   Procedure: SAVORY DILATION;  Surgeon: Doran Stabler, MD;  Location: [facility];  Service: Gastroenterology;  Laterality: N/A;  . SAVORY DILATION N/A 01/20/2020   Procedure: SAVORY DILATION;  Surgeon: Doran Stabler, MD;   Location: [facility];  Service: Gastroenterology;  Laterality: N/A;  . TONSILLECTOMY      Social History   Socioeconomic History  . Marital status: Divorced    Spouse name: Not on file  . Number of children: 0  . Years of education: MA  . Highest education level: Master's degree (e.g., MA, MS, MEng, MEd, MSW, MBA)  Occupational History  . Occupation: Retired    Fish farm manager: OTHER  Tobacco Use  . Smoking status: Former Smoker    Packs/day: 3.00    Years: 45.00    Pack years: 135.00    Types: Cigarettes    Quit date: 12/05/1997    Years since quitting: 22.2  . Smokeless tobacco: Never Used  Substance and Sexual Activity  . Alcohol use: No    Alcohol/week: 0.0 standard drinks    Comment: rarely   . Drug use: No  . Sexual activity: Not Currently  Other Topics Concern  . Not on file  Social History Narrative   Patient lives at home alone.   Caffeine Use: 1 cups daily   Right handed   Social Determinants of Health   Financial Resource Strain:   . Difficulty of Paying Living Expenses:   Food Insecurity:   . Worried About Charity fundraiser in the Last Year:   . Arboriculturist in the Last Year:   Transportation Needs:   . Film/video editor (Medical):   [name] Lack of Transportation (Non-Medical):   Physical Activity:   . Days of Exercise per Week:   . Minutes of Exercise per Session:   Stress:   . Feeling of Stress :   Social Connections:   . Frequency of Communication with Friends and Family:   . Frequency of Social Gatherings with Friends and Family:   . Attends Religious Services:   . Active Member of Clubs or Organizations:   . Attends Archivist Meetings:   [name] Marital Status:     Family History  Problem Relation Age of Onset  . Other Mother   . Anuerysm Mother        brain  . Other Father        failure to thrive  . Hypertension Other   . Stroke Other   . Heart attack Other   . Hemachromatosis Other        great grandfather  . Rheum  arthritis Other        both sets of grandparents and father  . Migraines Neg Hx   .  Colon cancer Neg Hx   . Colon polyps Neg Hx   . Esophageal cancer Neg Hx   . Pancreatic cancer Neg Hx   . Stomach cancer Neg Hx   . Liver disease Neg Hx     Review of Systems  Constitutional: Negative for fever.  Eyes: Positive for visual disturbance (blurriness - chronic, intermittent).  Respiratory: Negative for shortness of breath.   Cardiovascular: Negative for chest pain and palpitations.  Gastrointestinal: Negative for nausea.  Neurological: Positive for dizziness, numbness and headaches.       Objective:   Vitals:   02/19/20 1111  BP: 120/78  Pulse: (!) 107  Resp: 16  Temp: 98.8 F (37.1 C)  SpO2: 97%   BP Readings from Last 3 Encounters:  02/19/20 120/78  02/05/20 129/78  01/20/20 117/90   Wt Readings from Last 3 Encounters:  02/19/20 168 lb (76.2 kg)  02/05/20 166 lb 1.6 oz (75.3 kg)  01/20/20 166 lb 0.1 oz (75.3 kg)   Body mass index is 27.12 kg/m.   Physical Exam Constitutional:      General: She is not in acute distress.    Appearance: Normal appearance. She is not ill-appearing.  HENT:     Head: Normocephalic and atraumatic.  Skin:    General: Skin is warm and dry.     Findings: Rash (Significance for scalp) present.     Comments: Scab on left-either with surrounding erythema, open wound or discharge/bleeding  Neurological:     Mental Status: She is alert. She is disoriented.     Sensory: Sensory deficit (Bilateral lower extremities) present.     Motor: No weakness.     Gait: Gait abnormal (Wide-based-she walks this way to prevent falls).  Psychiatric:        Mood and Affect: Mood normal.        Behavior: Behavior normal.            Assessment & Plan:    20 minutes were spent face-to-face with the patient, most of which was discussed discussing new diagnosis of concussion, which is the result of her fall on Easter and hitting her head.      See  Problem List for Assessment and Plan of chronic medical problems.    This visit occurred during the SARS-CoV-2 public health emergency.  Safety protocols were in place, including screening questions prior to the visit, additional usage of staff PPE, and extensive cleaning of exam room while observing appropriate contact time as indicated for disinfecting solutions.

## 2020-02-19 NOTE — Assessment & Plan Note (Signed)
Chronic Following with Dr. Eyvonne Left referral, which was ordered today Taking duloxetine and gabapentin-continue

## 2020-03-16 ENCOUNTER — Telehealth: Payer: Self-pay | Admitting: Internal Medicine

## 2020-03-16 MED ORDER — DEXCOM G6 TRANSMITTER MISC: 6 refills | Status: DC

## 2020-03-16 MED ORDER — DEXCOM G6 SENSOR MISC: 3 refills | Status: DC

## 2020-03-16 NOTE — Telephone Encounter (Signed)
Patient called stating she needs to have a new Dexcom sensor and transmitter.  Medication Refill Request  Did you call your pharmacy and request this refill first? Yes  . If patient has not contacted pharmacy first, instruct them to do so for future refills.  . Remind them that contacting the pharmacy for their refill is the quickest method to get the refill.  . Refill policy also stated that it will take anywhere between 24-72 hours to receive the refill.    Name of medication? dexcom sensors and transmitter  Is this a 90 day supply? yes  Name and location of pharmacy?   [facility] Phone# [phone]

## 2020-03-16 NOTE — Telephone Encounter (Signed)
Orders sent to [facility].

## 2020-03-20 ENCOUNTER — Other Ambulatory Visit: Payer: Self-pay | Admitting: Internal Medicine

## 2020-03-25 DIAGNOSIS — I10 Essential (primary) hypertension: Secondary | ICD-10-CM | POA: Diagnosis not present

## 2020-03-25 DIAGNOSIS — E1010 Type 1 diabetes mellitus with ketoacidosis without coma: Secondary | ICD-10-CM | POA: Diagnosis not present

## 2020-03-25 DIAGNOSIS — E1059 Type 1 diabetes mellitus with other circulatory complications: Secondary | ICD-10-CM | POA: Diagnosis not present

## 2020-03-25 DIAGNOSIS — R9431 Abnormal electrocardiogram [ECG] [EKG]: Secondary | ICD-10-CM | POA: Diagnosis not present

## 2020-03-25 DIAGNOSIS — I672 Cerebral atherosclerosis: Secondary | ICD-10-CM | POA: Diagnosis not present

## 2020-03-25 DIAGNOSIS — I152 Hypertension secondary to endocrine disorders: Secondary | ICD-10-CM | POA: Diagnosis not present

## 2020-03-25 DIAGNOSIS — R140 Abdominal distension (gaseous): Secondary | ICD-10-CM | POA: Diagnosis not present

## 2020-03-25 DIAGNOSIS — G9341 Metabolic encephalopathy: Secondary | ICD-10-CM | POA: Diagnosis not present

## 2020-03-25 DIAGNOSIS — R000 Tachycardia, unspecified: Secondary | ICD-10-CM | POA: Diagnosis not present

## 2020-03-25 DIAGNOSIS — R0602 Shortness of breath: Secondary | ICD-10-CM | POA: Diagnosis not present

## 2020-03-25 DIAGNOSIS — Z85118 Personal history of other malignant neoplasm of bronchus and lung: Secondary | ICD-10-CM | POA: Diagnosis not present

## 2020-03-25 DIAGNOSIS — K219 Gastro-esophageal reflux disease without esophagitis: Secondary | ICD-10-CM | POA: Diagnosis not present

## 2020-03-25 DIAGNOSIS — N3281 Overactive bladder: Secondary | ICD-10-CM | POA: Diagnosis not present

## 2020-03-25 DIAGNOSIS — R4182 Altered mental status, unspecified: Secondary | ICD-10-CM | POA: Diagnosis not present

## 2020-03-25 DIAGNOSIS — R9089 Other abnormal findings on diagnostic imaging of central nervous system: Secondary | ICD-10-CM | POA: Diagnosis not present

## 2020-03-25 DIAGNOSIS — E872 Acidosis: Secondary | ICD-10-CM | POA: Diagnosis not present

## 2020-03-25 DIAGNOSIS — F05 Delirium due to known physiological condition: Secondary | ICD-10-CM | POA: Diagnosis not present

## 2020-03-25 DIAGNOSIS — N179 Acute kidney failure, unspecified: Secondary | ICD-10-CM | POA: Diagnosis not present

## 2020-03-25 DIAGNOSIS — Z4682 Encounter for fitting and adjustment of non-vascular catheter: Secondary | ICD-10-CM | POA: Diagnosis not present

## 2020-03-25 DIAGNOSIS — E1042 Type 1 diabetes mellitus with diabetic polyneuropathy: Secondary | ICD-10-CM | POA: Diagnosis not present

## 2020-03-25 DIAGNOSIS — E1011 Type 1 diabetes mellitus with ketoacidosis with coma: Secondary | ICD-10-CM | POA: Diagnosis not present

## 2020-03-25 DIAGNOSIS — E870 Hyperosmolality and hypernatremia: Secondary | ICD-10-CM | POA: Diagnosis not present

## 2020-04-03 ENCOUNTER — Ambulatory Visit: Payer: Medicare PPO | Admitting: Internal Medicine

## 2020-04-06 DIAGNOSIS — N3020 Other chronic cystitis without hematuria: Secondary | ICD-10-CM | POA: Diagnosis not present

## 2020-04-06 DIAGNOSIS — N3941 Urge incontinence: Secondary | ICD-10-CM | POA: Diagnosis not present

## 2020-04-06 DIAGNOSIS — R81 Glycosuria: Secondary | ICD-10-CM | POA: Diagnosis not present

## 2020-04-08 NOTE — Progress Notes (Signed)
Subjective:    Patient ID: Megan [name], female    DOB: [birth year], 79 y.o.   MRN: [id number]  HPI The patient is here for follow up from the hospital. She is here with [name].     [facility] - 5/31-6/5  [name] found her on her bed at 1:30 and her face was pink.  There was a coke on the side of the bed.  She was unresponsive and he called EMS.  She is non-compliant with a diabetic diet - she eats a lot of sweets.  She does not take her medication regularly.  EMS said her sugar was 1058 - she was in ICU and in a coma for 5 days.  Then rehab and then home.     [name] has been monitoring her sugars.  Her sugars have been really high only a couple of times - 500's.    Glucose 230 this morning before she ate.  Last night before bed low 200's and 10 units of levemir.    She has not been compliant with a diabetic diet since getting home.  She is taking her other medications.   Medications and allergies reviewed with patient and updated if appropriate.  Patient Active Problem List   Diagnosis Date Noted  . Concussion 02/19/2020  . Head pain 05/10/2019  . GERD (gastroesophageal reflux disease) 10/17/2018  . CAP (community acquired pneumonia) 10/15/2018  . Post herpetic neuralgia 03/30/2018  . Herpes zoster without complication 34/19/3790  . Right elbow pain 03/01/2018  . Carpal tunnel syndrome on right 11/30/2017  . Cough 08/22/2017  . Hypokalemia 03/22/2017  . Primary osteoarthritis of both hands 03/08/2017  . Poor balance 02/07/2017  . Subarachnoid bleed ([place]) 01/11/2017  . Psoriatic arthropathy ([place]) 09/08/2016  . Hypertension 09/08/2016  . Diabetic neuropathy ([place]) 09/08/2016  . LADA (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), managed as type 1 ([place]) 05/04/2016  . Syncope 03/25/2016  . Malnutrition of moderate degree 02/29/2016  . Diabetic ketoacidosis with coma associated with diabetes mellitus due to underlying condition ([place])   . RLS (restless legs  syndrome) 12/11/2015  . Lumbago 05/06/2015  . Peripheral edema 04/14/2015  . Psoriasis 04/14/2015  . Emphysema of lung ([place]) 11/26/2014  . Radiation-induced esophageal stricture 10/01/2014  . Tachycardia 08/27/2014  . DNR (do not resuscitate) discussion 08/21/2014  . Cancer associated pain 07/29/2014  . Neuropathy due to chemotherapeutic drug ([place]) 07/18/2014  . Leg cramps 07/18/2014  . Thrombocytopenia, unspecified ([place]) 07/18/2014  . Dyspnea 04/28/2014  . Radiation esophagitis 04/10/2014  . Right-sided chest wall pain 04/01/2014  . Cancer of middle lobe of lung ([place]) 01/17/2014  . Malignant neoplasm of right upper lobe of lung ([place]) 11/28/2013    Current Outpatient Medications on File Prior to Visit  Medication Sig Dispense Refill  . albuterol (PROVENTIL HFA;VENTOLIN HFA) 108 (90 Base) MCG/ACT inhaler Inhale 2 puffs into the lungs every 6 (six) hours as needed for wheezing or shortness of breath. 1 Inhaler 2  . amLODipine (NORVASC) 10 MG tablet TAKE 1 TABLET(10 MG) BY MOUTH DAILY (Patient taking differently: Take 10 mg by mouth daily. ) 90 tablet 1  . augmented betamethasone dipropionate (DIPROLENE-AF) 0.05 % ointment     . betamethasone dipropionate (DIPROLENE) 0.05 % cream Apply topically 2 (two) times daily. (Patient taking differently: Apply 1 application topically 2 (two) times daily. ) 45 g 0  . budesonide-formoterol (SYMBICORT) 80-4.5 MCG/ACT inhaler Inhale 2 puffs into the lungs 2 (two) times daily. (Patient taking  differently: Inhale 2 puffs into the lungs daily as needed (Wheezing). ) 1 Inhaler 6  . Continuous Blood Gluc Sensor (DEXCOM G6 SENSOR) MISC Apply new sensor every 10 days for continuous glucose monitoring. 3 each 3  . Continuous Blood Gluc Transmit (DEXCOM G6 TRANSMITTER) MISC Use one transmitter every 3 months for continuous glucose monitoring. 1 each 6  . DULoxetine (CYMBALTA) 60 MG capsule Take 1 capsule (60 mg total) by mouth 2 (two) times daily. (Patient taking  differently: Take 60 mg by mouth daily. ) 180 capsule 4  . famotidine (PEPCID) 40 MG tablet Take 40 mg by mouth daily as needed for heartburn.     . Fluocinolone Acetonide Scalp 0.01 % OIL     . fluocinonide (LIDEX) 0.05 % external solution     . gabapentin (NEURONTIN) 300 MG capsule TAKE 1 CAPSULE(300 MG) BY MOUTH THREE TIMES DAILY (Patient taking differently: Take 300 mg by mouth 3 (three) times daily. ) 270 capsule 4  . glucagon 1 MG injection Inject 1 mg into the skin once.    . insulin aspart (NOVOLOG FLEXPEN) 100 UNIT/ML FlexPen INJECT 15 TO 26 UNITS UNDER THE SKIN THREE TIMES DAILY 15 mL 1  . Insulin Pen Needle (B-D UF III MINI PEN NEEDLES) 31G X 5 MM MISC Use with insulin pen 4 times a day to inject insulin 360 each 11  . ketoconazole (NIZORAL) 2 % cream     . LEVEMIR FLEXTOUCH 100 UNIT/ML Pen INJECT 17 UNITS UNDER THE SKIN IN THE MORNING AND 28 UNITS IN THE EVENING (Patient taking differently: Inject 7-12 Units into the skin at bedtime. Take 7 units in the morning and 27 units in the evening) 45 mL 3  . metoprolol tartrate (LOPRESSOR) 50 MG tablet     . solifenacin (VESICARE) 10 MG tablet Take 10 mg by mouth daily.     No current facility-administered medications on file prior to visit.    Past Medical History:  Diagnosis Date  . Cancer ([place]) dx'd 09/2013   Lung ca  . Concussion 2018  . Diabetes ([place]) 09/08/2016   type I diabetic patient reported on 09/13/16   . Diabetes mellitus    insulin  . Diabetic neuropathy ([place]) 09/08/2016  . Fall 2018  . GERD (gastroesophageal reflux disease)    no meds for  . History of colitis   . History of hiatal hernia   . Hx of radiation therapy 12/16/13-01/30/14   lung 66Gy  . Hypertension   . Malignant neoplasm of right upper lobe of lung ([place]) 11/28/2013  . Neuropathy   . Psoriasis   . Psoriatic arthritis ([place]) 09/08/2016  . Rheumatoid arthritis ([place])   . Shortness of breath dyspnea    with exertion    Past Surgical History:  Procedure  Laterality Date  . ABDOMINAL HYSTERECTOMY    . ABDOMINAL SURGERY    . BALLOON DILATION N/A 12/12/2014   Procedure: BALLOON DILATION;  Surgeon: Inda Castle, MD;  Location: [facility];  Service: Endoscopy;  Laterality: N/A;  . BIOPSY  01/20/2020   Procedure: BIOPSY;  Surgeon: Doran Stabler, MD;  Location: [facility];  Service: Gastroenterology;;  . CHOLECYSTECTOMY    . COLONOSCOPY N/A 02/07/2016   Procedure: COLONOSCOPY;  Surgeon: Doran Stabler, MD;  Location: [facility]COPY;  Service: Endoscopy;  Laterality: N/A;  . DILATION AND CURETTAGE OF UTERUS    . ESOPHAGOGASTRODUODENOSCOPY N/A 12/12/2014   Procedure: ESOPHAGOGASTRODUODENOSCOPY (EGD);  Surgeon: Inda Castle, MD;  Location: [facility];  Service: Endoscopy;  Laterality: N/A;  with dilation  . ESOPHAGOGASTRODUODENOSCOPY (EGD) WITH PROPOFOL N/A 10/09/2014   Procedure: ESOPHAGOGASTRODUODENOSCOPY (EGD) WITH PROPOFOL;  Surgeon: Inda Castle, MD;  Location: [facility];  Service: Endoscopy;  Laterality: N/A;  . ESOPHAGOGASTRODUODENOSCOPY (EGD) WITH PROPOFOL N/A 09/19/2016   Procedure: ESOPHAGOGASTRODUODENOSCOPY (EGD) WITH PROPOFOL;  Surgeon: Doran Stabler, MD;  Location: [facility];  Service: Gastroenterology;  Laterality: N/A;  with savary dil tt  . ESOPHAGOGASTRODUODENOSCOPY (EGD) WITH PROPOFOL N/A 01/20/2020   Procedure: ESOPHAGOGASTRODUODENOSCOPY (EGD) WITH PROPOFOL;  Surgeon: Doran Stabler, MD;  Location: [facility];  Service: Gastroenterology;  Laterality: N/A;  Flu[place]  . ORIF ANKLE FRACTURE Right 07/04/2015   Procedure: OPEN REDUCTION INTERNAL FIXATION (ORIF) ANKLE FRACTURE;  Surgeon: Newt Minion, MD;  Location: [facility];  Service: Orthopedics;  Laterality: Right;  OPEN REDUCTION, INTERNAL FIXATION OF RIGHT ANKLE FRACTURE.   [name] ORIF ANKLE FRACTURE Left 02/10/2016   Procedure: OPEN REDUCTION INTERNAL FIXATION (ORIF) ANKLE FRACTURE;  Surgeon: Newt Minion, MD;  Location: [facility];  Service: Orthopedics;   Laterality: Left;  . SAVORY DILATION N/A 10/09/2014   Procedure: SAVORY DILATION;  Surgeon: Inda Castle, MD;  Location: [facility];  Service: Endoscopy;  Laterality: N/A;  . SAVORY DILATION N/A 09/19/2016   Procedure: SAVORY DILATION;  Surgeon: Doran Stabler, MD;  Location: [facility];  Service: Gastroenterology;  Laterality: N/A;  . SAVORY DILATION N/A 01/20/2020   Procedure: SAVORY DILATION;  Surgeon: Doran Stabler, MD;  Location: [facility];  Service: Gastroenterology;  Laterality: N/A;  . TONSILLECTOMY      Social History   Socioeconomic History  . Marital status: Divorced    Spouse name: Not on file  . Number of children: 0  . Years of education: MA  . Highest education level: Master's degree (e.g., MA, MS, MEng, MEd, MSW, MBA)  Occupational History  . Occupation: Retired    Fish farm manager: OTHER  Tobacco Use  . Smoking status: Former Smoker    Packs/day: 3.00    Years: 45.00    Pack years: 135.00    Types: Cigarettes    Quit date: 12/05/1997    Years since quitting: 22.3  . Smokeless tobacco: Never Used  Vaping Use  . Vaping Use: Never used  Substance and Sexual Activity  . Alcohol use: No    Alcohol/week: 0.0 standard drinks    Comment: rarely   . Drug use: No  . Sexual activity: Not Currently  Other Topics Concern  . Not on file  Social History Narrative   Patient lives at home alone.   Caffeine Use: 1 cups daily   Right handed   Social Determinants of Health   Financial Resource Strain:   . Difficulty of Paying Living Expenses:   Food Insecurity:   . Worried About Charity fundraiser in the Last Year:   . Arboriculturist in the Last Year:   Transportation Needs:   . Film/video editor (Medical):   [name] Lack of Transportation (Non-Medical):   Physical Activity:   . Days of Exercise per Week:   . Minutes of Exercise per Session:   Stress:   . Feeling of Stress :   Social Connections:   . Frequency of Communication with Friends and Family:    . Frequency of Social Gatherings with Friends and Family:   . Attends Religious Services:   . Active Member of Clubs or Organizations:   .  Attends Archivist Meetings:   [name] Marital Status:     Family History  Problem Relation Age of Onset  . Other Mother   . Anuerysm Mother        brain  . Other Father        failure to thrive  . Hypertension Other   . Stroke Other   . Heart attack Other   . Hemachromatosis Other        great grandfather  . Rheum arthritis Other        both sets of grandparents and father  . Migraines Neg Hx   . Colon cancer Neg Hx   . Colon polyps Neg Hx   . Esophageal cancer Neg Hx   . Pancreatic cancer Neg Hx   . Stomach cancer Neg Hx   . Liver disease Neg Hx     Review of Systems  Constitutional: Positive for fatigue. Negative for fever.  Eyes: Positive for visual disturbance (intermittent double vision).  Respiratory: Positive for cough, shortness of breath and wheezing.   Cardiovascular: Positive for leg swelling (mild). Negative for chest pain and palpitations.  Neurological: Positive for dizziness, light-headedness and headaches (intermittent).       Objective:   Vitals:   04/09/20 1007  BP: 118/74  Pulse: 71  Temp: 97.8 F (36.6 C)  SpO2: 96%   BP Readings from Last 3 Encounters:  04/09/20 118/74  02/19/20 120/78  02/05/20 129/78   Wt Readings from Last 3 Encounters:  04/09/20 156 lb 9.6 oz (71 kg)  02/19/20 168 lb (76.2 kg)  02/05/20 166 lb 1.6 oz (75.3 kg)   Body mass index is 25.28 kg/m.   Physical Exam    Constitutional: Appears well-developed and well-nourished. No distress.  HENT:  Head: Normocephalic and atraumatic.  Neck: Neck supple. No tracheal deviation present. No thyromegaly present.  No cervical lymphadenopathy Cardiovascular: Normal rate, regular rhythm and normal heart sounds.   No murmur heard. No carotid bruit .  Trace b/l LE edema Pulmonary/Chest: Effort normal and breath sounds normal. No  respiratory distress. No has no wheezes. No rales.  Skin: Skin is warm and dry. Not diaphoretic.  Psychiatric: Normal mood and affect. Behavior is normal.      Assessment & Plan:    See Problem List for Assessment and Plan of chronic medical problems.    This visit occurred during the SARS-CoV-2 public health emergency.  Safety protocols were in place, including screening questions prior to the visit, additional usage of staff PPE, and extensive cleaning of exam room while observing appropriate contact time as indicated for disinfecting solutions.

## 2020-04-09 ENCOUNTER — Encounter: Payer: Self-pay | Admitting: Internal Medicine

## 2020-04-09 ENCOUNTER — Ambulatory Visit: Payer: Medicare PPO | Admitting: Internal Medicine

## 2020-04-09 ENCOUNTER — Other Ambulatory Visit: Payer: Self-pay

## 2020-04-09 VITALS — BP 100/60 | HR 76 | Ht 66.0 in | Wt 159.0 lb

## 2020-04-09 DIAGNOSIS — E139 Other specified diabetes mellitus without complications: Secondary | ICD-10-CM

## 2020-04-09 DIAGNOSIS — I10 Essential (primary) hypertension: Secondary | ICD-10-CM | POA: Diagnosis not present

## 2020-04-09 DIAGNOSIS — E1149 Type 2 diabetes mellitus with other diabetic neurological complication: Secondary | ICD-10-CM | POA: Diagnosis not present

## 2020-04-09 LAB — POCT GLYCOSYLATED HEMOGLOBIN (HGB A1C): Hemoglobin A1C: 9.6 % — AB (ref 4.0–5.6)

## 2020-04-09 MED ORDER — TRESIBA FLEXTOUCH 100 UNIT/ML ~~LOC~~ SOPN: 22.0000 [IU] | PEN_INJECTOR | Freq: Every day | SUBCUTANEOUS | 3 refills | Status: DC

## 2020-04-09 NOTE — Progress Notes (Addendum)
Patient ID: Megan [name], female   DOB: 12-Nov-1940, 79 y.o.   MRN: [id number]  This visit occurred during the SARS-CoV-2 public health emergency.  Safety protocols were in place, including screening questions prior to the visit, additional usage of staff PPE, and extensive cleaning of exam room while observing appropriate contact time as indicated for disinfecting solutions.   HPI: Megan [name] is a 79 y.o.-year-old female, presenting for f/u for DM2, dx in 2013, and as LADA in 04/2016, insulin-dependent since 2014, uncontrolled, with complications (DKA, ? Related PN). Last visit 1 year and 2 months ago (virtual)! She is here with her husband who offers part of the history especially regarding her blood sugars at home, insulin doses, eating habits, and details of her recent hospitalization.  A week ago, husband found her in a coma >> taken to the hospital >> Glu 1058.  Before this, patient was eating a lot of sugary desserts (which he decided to eliminate since then).  Also, she was not taking the insulin as recommended at last visit (see below).  Moreover, her Dexcom CGM was not approved as we could not send quarterly reports of her disease since she was not seen in more than a year.  They would very much like to go back on the Dexcom CGM, on which she felt much safer.  Reviewed history: She has a complicated medical history, with a diagnosis of ulcerative colitis with significant diarrhea that caused dehydration in the past and episodes of DKA. She had 2 admissions for this in 02/26/2016 and 04/14/2016. While in rehabilitation, her CBGs dropped to 30s on her previous dose of insulin, therefore, her long-acting insulin was decreased.   She also has a history of lung cancer diagnosed 09/2013.  She had chemotherapy and radiation therapy for this.  Reviewed HbA1c levels: Lab Results  Component Value Date   HGBA1C 9.4 (H) 11/18/2019   HGBA1C 11.3 (H) 05/10/2019   HGBA1C 9.0 (A)  10/18/2018  12/30/2015: 12% 09/18/2015: 11.3% 06/17/2015: 7.5% 04/08/2015: 10.9% 09/10/2014: 8.5%  11/04/2013: 6.2%  Now on:  (not following the  - Levemir  >> 12 >> 10 units 1-2x a day (may skip am doses)   - Humalog mealtime dose  - breakfast and dinner   - smaller meal: 15 units     Please do not skip the insulin if sugars are 70-100.   If the sugars are 60-70, take 1/2 of the Humalog dose.  If the sugars are <60, skip insulin with that meal  No Humalog at bedtime, unless sugars >300: take 5 units or >400: 8 units She had a SSI before, but was not using it.  She inquired about an insulin pump in the past, but after we discussed about the complexity of managing this, we decided to continue with insulin pens.  She checks her sugars approximately 4 times a day and sometimes even more:    Previously:   Lowest sugar was 29 >> 45 >> 32 (after lunch); she has hypoglycemia awareness in the 50s. Highest sugar was 528 >> 350 >> 1058 - but after hospitalization: 429 (at night).  Pt's meals are: - Breakfast: Cereal or oatmeal with blueberries or eggs with sausage - Lunch: Sandwich and soup - Dinner: Meat, vegetables, small amount of starch - Snacks: 3-4 a day: Peanuts  -+ Mild CKD, last BUN/creatinine:  Lab Results  Component Value Date   BUN 16 02/03/2020   BUN 20 11/18/2019   CREATININE 1.09 (H) 02/03/2020  CREATININE 1.09 11/18/2019   -+ HL: last set of lipids: Lab Results  Component Value Date   CHOL 216 (H) 05/10/2019   HDL 48.70 05/10/2019   LDLCALC 128 (H) 11/14/2016   LDLDIRECT 156.0 05/10/2019   TRIG 229.0 (H) 05/10/2019   CHOLHDL 4 05/10/2019   - last eye exam was in 12/2018: No DR; Dr. Lady Gary. -+ Numbness but no tingling in her feet.  She has PN from ChTx and possibly also from diabetes.  Off Lyrica.  ROS: Constitutional: no weight gain/no weight loss, no fatigue, no subjective hyperthermia, no subjective hypothermia Eyes: no blurry vision, no  xerophthalmia ENT: no sore throat, no nodules palpated in neck, no dysphagia, no odynophagia, no hoarseness Cardiovascular: no CP/+ SOB/no palpitations/no leg swelling Respiratory: no cough/+ SOB/+ wheezing Gastrointestinal: no N/no V/no D/no C/no acid reflux Musculoskeletal: no muscle aches/no joint aches Skin: no rashes, no hair loss Neurological: no tremors/+ numbness/no tingling/no dizziness  I reviewed pt's medications, allergies, PMH, social hx, family hx, and changes were documented in the history of present illness. Otherwise, unchanged from my initial visit note.  Past Medical History:  Diagnosis Date  . Cancer ([place]) dx'd 09/2013   Lung ca  . Concussion 2018  . Diabetes ([place]) 09/08/2016   type I diabetic patient reported on 09/13/16   . Diabetes mellitus    insulin  . Diabetic neuropathy ([place]) 09/08/2016  . Fall 2018  . GERD (gastroesophageal reflux disease)    no meds for  . History of colitis   . History of hiatal hernia   . Hx of radiation therapy 12/16/13-01/30/14   lung 66Gy  . Hypertension   . Malignant neoplasm of right upper lobe of lung ([place]) 11/28/2013  . Neuropathy   . Psoriasis   . Psoriatic arthritis ([place]) 09/08/2016  . Rheumatoid arthritis ([place])   . Shortness of breath dyspnea    with exertion   Past Surgical History:  Procedure Laterality Date  . ABDOMINAL HYSTERECTOMY    . ABDOMINAL SURGERY    . BALLOON DILATION N/A 12/12/2014   Procedure: BALLOON DILATION;  Surgeon: Inda Castle, MD;  Location: [facility];  Service: Endoscopy;  Laterality: N/A;  . BIOPSY  01/20/2020   Procedure: BIOPSY;  Surgeon: Doran Stabler, MD;  Location: [facility];  Service: Gastroenterology;;  . CHOLECYSTECTOMY    . COLONOSCOPY N/A 02/07/2016   Procedure: COLONOSCOPY;  Surgeon: Doran Stabler, MD;  Location: [facility];  Service: Endoscopy;  Laterality: N/A;  . DILATION AND CURETTAGE OF UTERUS    . ESOPHAGOGASTRODUODENOSCOPY N/A 12/12/2014   Procedure:  ESOPHAGOGASTRODUODENOSCOPY (EGD);  Surgeon: Inda Castle, MD;  Location: [facility];  Service: Endoscopy;  Laterality: N/A;  with dilation  . ESOPHAGOGASTRODUODENOSCOPY (EGD) WITH PROPOFOL N/A 10/09/2014   Procedure: ESOPHAGOGASTRODUODENOSCOPY (EGD) WITH PROPOFOL;  Surgeon: Inda Castle, MD;  Location: [facility];  Service: Endoscopy;  Laterality: N/A;  . ESOPHAGOGASTRODUODENOSCOPY (EGD) WITH PROPOFOL N/A 09/19/2016   Procedure: ESOPHAGOGASTRODUODENOSCOPY (EGD) WITH PROPOFOL;  Surgeon: Doran Stabler, MD;  Location: [facility];  Service: Gastroenterology;  Laterality: N/A;  with savary dil tt  . ESOPHAGOGASTRODUODENOSCOPY (EGD) WITH PROPOFOL N/A 01/20/2020   Procedure: ESOPHAGOGASTRODUODENOSCOPY (EGD) WITH PROPOFOL;  Surgeon: Doran Stabler, MD;  Location: [facility];  Service: Gastroenterology;  Laterality: N/A;  Fluoro-Savary  . ORIF ANKLE FRACTURE Right 07/04/2015   Procedure: OPEN REDUCTION INTERNAL FIXATION (ORIF) ANKLE FRACTURE;  Surgeon: Newt Minion, MD;  Location: [facility];  Service: Orthopedics;  Laterality: Right;  OPEN REDUCTION, INTERNAL FIXATION OF RIGHT ANKLE FRACTURE.   [name] ORIF ANKLE FRACTURE Left 02/10/2016   Procedure: OPEN REDUCTION INTERNAL FIXATION (ORIF) ANKLE FRACTURE;  Surgeon: Newt Minion, MD;  Location: [facility];  Service: Orthopedics;  Laterality: Left;  . SAVORY DILATION N/A 10/09/2014   Procedure: SAVORY DILATION;  Surgeon: Inda Castle, MD;  Location: [facility];  Service: Endoscopy;  Laterality: N/A;  . SAVORY DILATION N/A 09/19/2016   Procedure: SAVORY DILATION;  Surgeon: Doran Stabler, MD;  Location: [facility];  Service: Gastroenterology;  Laterality: N/A;  . SAVORY DILATION N/A 01/20/2020   Procedure: SAVORY DILATION;  Surgeon: Doran Stabler, MD;  Location: [facility];  Service: Gastroenterology;  Laterality: N/A;  . TONSILLECTOMY     Social History   Social History  . Marital Status: Divorced    Spouse Name: N/A  . Number of  Children: 0   Occupational History  . Retired Pharmacist, hospital Other   Social History Main Topics  . Smoking status: Former Smoker -- 3.00 packs/day for 45 years    Types: Cigarettes    Quit date: 12/05/1997  . Smokeless tobacco: Never Used  . Alcohol Use: No  . Drug Use: No   Social History Narrative   Patient lives at home alone.   Caffeine Use: 2 cups daily   Current Outpatient Medications on File Prior to Visit  Medication Sig Dispense Refill  . albuterol (PROVENTIL HFA;VENTOLIN HFA) 108 (90 Base) MCG/ACT inhaler Inhale 2 puffs into the lungs every 6 (six) hours as needed for wheezing or shortness of breath. 1 Inhaler 2  . amLODipine (NORVASC) 10 MG tablet TAKE 1 TABLET(10 MG) BY MOUTH DAILY (Patient taking differently: Take 10 mg by mouth daily. ) 90 tablet 1  . augmented betamethasone dipropionate (DIPROLENE-AF) 0.05 % ointment     . betamethasone dipropionate (DIPROLENE) 0.05 % cream Apply topically 2 (two) times daily. (Patient taking differently: Apply 1 application topically 2 (two) times daily. ) 45 g 0  . budesonide-formoterol (SYMBICORT) 80-4.5 MCG/ACT inhaler Inhale 2 puffs into the lungs 2 (two) times daily. (Patient taking differently: Inhale 2 puffs into the lungs daily as needed (Wheezing). ) 1 Inhaler 6  . Continuous Blood Gluc Sensor (DEXCOM G6 SENSOR) MISC Apply new sensor every 10 days for continuous glucose monitoring. 3 each 3  . Continuous Blood Gluc Transmit (DEXCOM G6 TRANSMITTER) MISC Use one transmitter every 3 months for continuous glucose monitoring. 1 each 6  . DULoxetine (CYMBALTA) 60 MG capsule Take 1 capsule (60 mg total) by mouth 2 (two) times daily. (Patient taking differently: Take 60 mg by mouth daily. ) 180 capsule 4  . famotidine (PEPCID) 40 MG tablet Take 40 mg by mouth daily as needed for heartburn.     . Fluocinolone Acetonide Scalp 0.01 % OIL     . fluocinonide (LIDEX) 0.05 % external solution     . gabapentin (NEURONTIN) 300 MG capsule TAKE 1  CAPSULE(300 MG) BY MOUTH THREE TIMES DAILY (Patient taking differently: Take 300 mg by mouth 3 (three) times daily. ) 270 capsule 4  . glucagon 1 MG injection Inject 1 mg into the skin once.    . insulin aspart (NOVOLOG FLEXPEN) 100 UNIT/ML FlexPen INJECT 15 TO 26 UNITS UNDER THE SKIN THREE TIMES DAILY 15 mL 1  . Insulin Pen Needle (B-D UF III MINI PEN NEEDLES) 31G X 5 MM MISC Use with insulin pen 4 times a day to inject insulin 360  each 11  . ketoconazole (NIZORAL) 2 % cream     . LEVEMIR FLEXTOUCH 100 UNIT/ML Pen INJECT 17 UNITS UNDER THE SKIN IN THE MORNING AND 28 UNITS IN THE EVENING (Patient taking differently: Inject 7-12 Units into the skin at bedtime. Take 7 units in the morning and 27 units in the evening) 45 mL 3  . metoprolol tartrate (LOPRESSOR) 50 MG tablet     . solifenacin (VESICARE) 10 MG tablet Take 10 mg by mouth daily.     No current facility-administered medications on file prior to visit.   Allergies  Allergen Reactions  . Carboplatin Anaphylaxis, Shortness Of Breath and Other (See Comments)    Neuropathy  . Remicade [Infliximab] Anaphylaxis  . Rifampin Other (See Comments)    unknown  . Hydromorphone Rash   Family History  Problem Relation Age of Onset  . Other Mother   . Anuerysm Mother        brain  . Other Father        failure to thrive  . Hypertension Other   . Stroke Other   . Heart attack Other   . Hemachromatosis Other        great grandfather  . Rheum arthritis Other        both sets of grandparents and father  . Migraines Neg Hx   . Colon cancer Neg Hx   . Colon polyps Neg Hx   . Esophageal cancer Neg Hx   . Pancreatic cancer Neg Hx   . Stomach cancer Neg Hx   . Liver disease Neg Hx    PE: BP 100/60   Pulse 76   Ht 5\' 6"  (1.676 m)   Wt 159 lb (72.1 kg)   SpO2 98%   BMI 25.66 kg/m  Wt Readings from Last 3 Encounters:  04/09/20 159 lb (72.1 kg)  04/09/20 156 lb 9.6 oz (71 kg)  02/19/20 168 lb (76.2 kg)   Constitutional:  Normal  weight, in NAD Eyes: PERRLA, EOMI, no exophthalmos ENT: moist mucous membranes, no thyromegaly, no cervical lymphadenopathy Cardiovascular: RRR, No MRG, + lower extremity swelling bilaterally-nonpitting Respiratory: CTA B Gastrointestinal: abdomen soft, NT, ND, BS+ Musculoskeletal: no deformities, strength intact in all 4 Skin: moist, warm, no rashes, + multiple bruises Neurological: no tremor with outstretched hands, DTR normal in all 4  ASSESSMENT: 1. LADA, insulin-dependent, uncontrolled, with complications - DKA in the setting of dehydration (- PN) - this may be 2/2 ChTx.  Pulmonologist: Dr. Chase Caller Oncologist: Dr. Earlie Server Cardiologist: Dr. Sallyanne Kuster Ophthalmologist: Dr. Clifton James  2.  Peripheral neuropathy -Possibly from Diabetes  PLAN:  1. Patient with longstanding, poorly controlled diabetes diagnosis LADA, with very fluctuating CBGs, on a basal bolus insulin regimen.  She returns after long absence of more than a year.  She missed the appointment! few days ago as she was in the hospital with a very high blood sugar, above 1000! -At last visit, she just started on the Dexcom CGM and we reviewed the downloads together.  She was woken up at night with high blood sugars and her sugars were dropping to the 70s before lunch and after this, they were increasing to approximately 300 after lunch and very high even after dinner, after which they dropped again, the 180s.  She had less low blood sugars at that time compared to the previous visit.  Upon questioning, she was taking a much higher dose of insulin with breakfast and dinner and subsequently she was dropping her sugars after these  meals.  I advised her to decrease her insulin with breakfast and dinner and increasing the dose with lunch since her sugars are very high after this meal.  I also advised her to take a higher dose of Levemir at bedtime but we stopped her Levemir in the morning. -At this visit, she received a long absence.   She is off the CGM.  I explained that her insurance did not cover it without our office admitting quarterly evaluation records.  Since she was not seen in the office, we could not extend dose.  She is very much interested to get back on the sensor.  We will submit the paperwork ASAP.  I agree that she will need to be on the sensor to benefit from the alarms and the discipline that the sensor promotes. - She and her husband have questions about options for treatment at today's visit.  Her husband is wondering about an insulin pump.  This was discussed with the patient in the past and due to the complexity of the management of her diabetes we decided not to pursue this.  I continue to believe that this is not the best option for her at this point. -We also discussed about other medications but I explained that since she has a variant of type 1 diabetes, not many options are available other than basal-bolus insulin regimen. -I did, however, advised him to try to keep their appointments and continue to be vigilant in checking blood sugars and controlling the diet.  I believe this is the best that she can do at this point. -We did review together her regimen (also reviewed discharge papers from her recent hospitalization) and this is different than the one recommended at last visit.  Her sugars are very high overnight and then they improve during the day.  For now, we discussed about increasing the dose of basal insulin and I also suggested try to change to [place] which is longer acting and he is using less fluctuations in her blood sugars.  I am hoping that this is covered for her.  Also, she is using a fixed dose of Humalog before meals and we discussed that this is not usually conducive to good control.  I advised her to use a range of doses depending on the size of her meals.  Her husband is not giving her the injections if the sugars are at goal before visit and we discussed that she needs to get the insulin if  she plans to eat.  I did advise him about how to decrease the dose if the sugars are lower before a meal. - I suggested to:  Patient Instructions  Please change: - Levemir to [place] 22 units daily - Humalog mealtime dose   - smaller meal: 12 units  - larger meal: 18 units   Please do not skip the insulin if sugars are 70-100.   If the sugars are 60-70, take 1/2 of the Humalog dose.  If the sugars are <60, skip insulin with that meal  No Humalog at bedtime.   Please return in 1 month.   - we checked her HbA1c: 9.6% (higher) - advised to check sugars at different times of the day - 4x a day, rotating check times >> try to get back on the CGM - advised for yearly eye exams >> she is not UTD - return to clinic in 1 month   2. PN  -Stable, without pain, only with numbness -Previously on Lyrica but  came off afterwards.  - time spent for the visit: 40 min, of which >50% was spent in reviewing her meter downloads, discussing her hypo- and hyper-glycemic episodes, reviewing previous labs and insulin doses, reviewing possible options for treatment, discussing insulin pump treatments, and developing a plan to avoid hypo- and hyper-glycemia.  They had a series of questions that I answered to the best of my ability.  Philemon Kingdom, MD PhD [facility] Endocrinology

## 2020-04-09 NOTE — Patient Instructions (Addendum)
Please change: - Levemir to [place] 22 units daily - Humalog mealtime dose   - smaller meal: 12 units  - larger meal: 18 units   Please do not skip the insulin if sugars are 70-100.   If the sugars are 60-70, take 1/2 of the Humalog dose.  If the sugars are <60, skip insulin with that meal  No Humalog at bedtime.   Please return in 1 months.

## 2020-04-09 NOTE — Patient Instructions (Signed)
  Medications reviewed and updated.  Changes include :   none    Please followup in 6 months   

## 2020-04-09 NOTE — Assessment & Plan Note (Signed)
Chronic BP well controlled Current regimen effective and well tolerated Continue current medications at current doses  

## 2020-04-09 NOTE — Assessment & Plan Note (Signed)
Chronic Poorly controlled due to non compliance with medication and diabetic diet Has f/u with Dr Cruzita Lederer today Megan [name] is monitoring her sugars Had a long discussion with her regarding the importance of compliance with a low sugar diet - she does not seem to understand the seriousness of this despite the fact that she knows she is lucky to be alive

## 2020-04-10 ENCOUNTER — Telehealth: Payer: Self-pay

## 2020-04-10 ENCOUNTER — Institutional Professional Consult (permissible substitution): Payer: Medicare Other | Admitting: Neurology

## 2020-04-10 NOTE — Telephone Encounter (Signed)
Faxed referral order for a CGM to [facility].

## 2020-04-15 ENCOUNTER — Telehealth: Payer: Self-pay | Admitting: Internal Medicine

## 2020-04-15 NOTE — Telephone Encounter (Signed)
Please advise in Dr. Arman Filter absence.

## 2020-04-15 NOTE — Telephone Encounter (Signed)
Called pt's fiance [name], and gave him MD message. He wrote all of this information down and did read it back correctly. He verbalized understanding and he was informed that if he starts seeing any blood sugar trends that concern him, please contact this office.

## 2020-04-15 NOTE — Telephone Encounter (Signed)
Increase Tresiba to 26 units now and to 30 if am Sugar > 180 IN 3 DAYS. REDUCE HUMALOG TO 7-9 FOR LO CARB MEALS

## 2020-04-15 NOTE — Telephone Encounter (Signed)
Spoke to [place]:  Morning CBG's running 200-321 since 04/10/2020  04/10/20 7:36 AM before eating 321 gave 22 units Tresiba 12 Humalog              3:16 PM after lunch     237 gave 12 units Humalog              5:14 PM before dinner 102 gave 12 units Humalog              8:49 PM                        56 ate cracker, cantaloupe, small amt cheese, no insulin              10:50 PM                      161 no insulin given  04/11/2020 7:14 AM before eating 257 gave 22 units Tresiba 12 units Humalog                  10:44 AM                     212  No insulin given                  11:48 before lunch      260 gave 12 units Humalog                   2:15 PM                      196  No insulin given                   Before dinner (no time given) 185 gave 12 units Humalog                   8:40 PM                        156 no insulin given  04/12/2020  8:08 AM before eating  201 gave 22 units Tresiba 12 units Humalog                   11:30 AM                      120 gave 12 units Humalog                    2:41                             104 no insulin given                    5:00 before dinner       171 gave 12 units Humalog                    8:00 PM                        91 no insulin given  04/13/2020 6:56 AM before eating     227 gave 22 units Tresiba 12 Humalog                  11:00 AM  283 gave 12 units Humalog                   3:38  PM                         144 no insulin given                   5:44 PM                          140 no insulin given                   8:30 PM                          183 no insulin given  04/14/2020 9:06 AM before eating     237 gave 22 units Tresiba 12 units Humalog                  12:52 before lunch          80 no insulin given                   2:13 PM after lunch        309 (chicken wrap from [employer] that had a lot of sauce)  Gave 12 units Humalog                  4:35 PM before dinner     63 no insulin given                    After dinner           131 no insulin given  04/16/2020 6:35 AM before breakfast 155 gave 22 units Tresiba 12 units Humalog                  Patient had egg sandwich, coffee with Bjorn Pippin creamer                  12:01 PM [name] returned home to find patient on the floor unconscious with blood on the floor.                                  EMS arrived and determined blood was from psoriasis on her scalp but did note a blood sugar of 52

## 2020-04-15 NOTE — Telephone Encounter (Signed)
Patient's friend called stating since patient has started tresiba the other day her sugars have been "all over the place" - friend states the EMS just came to her house and her sugar was high. Patient's friends number is [phone] [name]).

## 2020-04-15 NOTE — Telephone Encounter (Signed)
Patient's fiance [name] requests to be called at ph# [phone] re: questions about medication and patient's blood sugars.

## 2020-04-16 NOTE — Telephone Encounter (Signed)
Attempted to contact [name] and pt separately. Neither answered the phone. Will try to contact again later.

## 2020-04-16 NOTE — Telephone Encounter (Signed)
Returned phone call to [place]. He stated that the pt's blood sugar was over 180 this morning and wanted to confirm if he is to increase [place] today or not. He was told that the [place] Aas is only increased 3 days after beginning the new dose of 26 units daily if blood sugars in am are consistently above 180. He did verbalize understanding of this. Pt was urged to call back with any questions or concerns that he has.

## 2020-04-16 NOTE — Telephone Encounter (Addendum)
[name] is calling back for [employer] - he said they missed calls and that they were returning.  Returned call to [phone]

## 2020-04-21 DIAGNOSIS — E1149 Type 2 diabetes mellitus with other diabetic neurological complication: Secondary | ICD-10-CM | POA: Diagnosis not present

## 2020-04-21 NOTE — Telephone Encounter (Signed)
Patient's husband would like nurse to give him a call at [phone] to discuss Dexcom.

## 2020-04-21 NOTE — Telephone Encounter (Signed)
Per [facility]:  We are still pending the patients consent in order to ship.  We called yesterday and left a voicemail. If you speak to the patient again today, please ask them to call us [phone].  I called the husband and let them know they need to return the call to [facility], number provided.

## 2020-04-27 ENCOUNTER — Telehealth: Payer: Self-pay | Admitting: Internal Medicine

## 2020-04-27 ENCOUNTER — Other Ambulatory Visit: Payer: Self-pay | Admitting: Internal Medicine

## 2020-04-27 MED ORDER — TRESIBA FLEXTOUCH 100 UNIT/ML ~~LOC~~ SOPN: 27.0000 [IU] | PEN_INJECTOR | Freq: Every day | SUBCUTANEOUS | 3 refills | Status: DC

## 2020-04-27 MED ORDER — GVOKE HYPOPEN 1-PACK 1 MG/0.2ML ~~LOC~~ SOAJ: SUBCUTANEOUS | 99 refills | Status: DC

## 2020-04-27 MED ORDER — NOVOLOG FLEXPEN 100 UNIT/ML ~~LOC~~ SOPN: PEN_INJECTOR | SUBCUTANEOUS | 3 refills | Status: DC

## 2020-04-27 NOTE — Telephone Encounter (Signed)
She has been taking 30 units of Tresiba since last 6/16 and 9 units of Humalog with each meal. She is also getting 9 units when her sugar goes above 100.  Patient is still having blood sugar readings 200-300.

## 2020-04-27 NOTE — Telephone Encounter (Signed)
In that case, ok to increase Tresiba to 34 units and may increase by 4 more units in 4 days, if still needed.  Also, stay very well-hydrated and, if not already doing so, please get in some light exercise.

## 2020-04-27 NOTE — Telephone Encounter (Signed)
Called on the After Hours line - d/w friend [name]): Sugars: 12 am: 279 3:40 am: 262 8:15 am: 270 10 am: 225 (Given Tresiba 30 units and Novolog 9 units) 11:30: 215 (Given Novolog 9 units) 5 pm: 49 EMS coming  Prev. Before lunch: 97, 70  Will decrease: - Tresiba: 30 >> 27 units - Novolog: 9 >> 7 units  Also sent Glucagon to the pharmacy.  Will allow her to have her higher sugars in the 200s for now, rather than driving her too low. [name] agrees.

## 2020-04-27 NOTE — Telephone Encounter (Signed)
I am not sure exactly what regimen they are using now, however He is still using 27 units of Tresiba, they may increase to 30. I agree with Dr. Ronnie Derby recommendation to administer: 7-9 units before a low carb meal 10-12 units before a high carb meal or if she eats desserts after that meal. I would also want her to make sure that she is getting a low dose of Humalog if her sugars are 100 or higher (7 to 9 units), since, per review of the sugar log day 7, patient is usually not getting short-acting insulin if her sugars are <180-200.

## 2020-04-27 NOTE — Telephone Encounter (Signed)
Patient's caregiver is calling in stating they still can not get her blood sugar levels to come down states it has been running 250-310.

## 2020-04-28 ENCOUNTER — Telehealth: Payer: Self-pay

## 2020-04-28 ENCOUNTER — Telehealth: Payer: Self-pay | Admitting: Nutrition

## 2020-04-28 NOTE — Telephone Encounter (Signed)
On 04/23/2020 we received a request from [employer] to fill out their CMN form for the CGM we ordered.  On 04/23/2020 Dr. Cruzita Lederer completed and signed form, I printed most recent chart notes and faxed.  The fax number provided on the form for [facility] is NOT working. Since 04/23/2020 I have faxed this form and chart notes 12 different times over different days and to separate fax numbers provided by [facility]. Every single fax attempt has failed.  I have no way to get this sent and patient will need to check with her insurance to find another DME supplier.

## 2020-04-28 NOTE — Telephone Encounter (Signed)
Message left on machine for me to call her.  I tried calling and had to leave a message to call me back.

## 2020-04-28 NOTE — Progress Notes (Signed)
[facility]    Provider:  Dr Jaynee Eagles Referring Provider: Binnie Rail, MD Primary Care Physician:  Binnie Rail, MD   04/29/2020: This is a wonderful 79 years old white female with history of stage IIIB non-small cell lung cancer status postcarboplatin and paclitaxel with resultant painful neuropathy.I've treated her in the past for neuropathy.  She is here for follow-up after hospitalization, she has a past medical history of lung cancer, obstructive sleep apnea, hypertension, polyneuropathy, GERD, hypertension, type 1 diabetes currently on insulin therapy with noncompliance to dietary restrictions, I reviewed notes from her admission and patient was admitted for DKA management/hyperglycemia she was found at home lying in bed unresponsive, to be in acute DKA with increased somnolence and subsequent lactic acid doses and acute metabolic encephalopathy as well as hyponatremia and admitted to the ICU.    Today she is her for new problems, she has had multiple falls and she even knocked herself out and there was a lot of blood. Today her BP is 78/48 and I recommended the ED but she declines. Started 5 years ago with syncope, they changed her blood pressure medication and she became better, she had labile blood glucose after that. She has been going to [facility], she was in the hospital from May to June 5th, I don;t have the records unfortunately. She went into the hospital because she fell, she hit her head, she got up out of bed and just got out of bed and passed out. She was taken to [facility] and sent to the ICU because of her vital signs, she cannot give me much informtion, they mentioned they thought the low glucose caused the fall, , her balance is poor, she is going down hill, worsening neuropathy in the left hand - in 2019 emg/ncs showed right CTS and left Ulnar neuropathy. Physical therapy for imbalance. She had 5 recent falls but not since eaving the hospital. Losing  balance, also passed out when getting up quickly.  Denies any new weakness. Reviewed MRi report 07/2019 w/wo contrast (this is prior to these new symptoms) see below.  [facility] also called [place], [place]: requested records  Addendum: received records from [place]: Patient is in the hospital for over a week, she was admitted at the end of November and discharged the beginning of June.    Reviewed CT of the head without contrast 03/25/2020 - for acute intracranial pathology, mild diffuse cerebral and cerebellar atrophy, slight periventricular white matter change.  Chest XR: 03/25/2020 stable chronic atelectasis or scarring medial lower right lung, lungs otherwise clear, stable chest no evidence for acute process.  Review of MRI of the brain report: 03/27/2020; mild diffuse parenchymal volume loss, ventricles normal in size, no midline shift, no evidence of intracranial hemorrhage, nothing to suggest acute infarct, mass no normal enhancement.  Impression: No acute intracranial hemorrhage or abnormal enhancement, mild diffuse volume loss.   Review of CT of the abdomen and pelvis due to abdominal distention 03/27/2020: Nonobstructive bowel gas pattern, no evidence of pneumatosis or portal vein gas, no renal stone or hydronephrosis, moderate stool throughout may reflect constipation, small nonspecific hypodense lesions in the liver may reflect cysts hemangiomas or possibly metastatic disease in this patient with history of lung cancer, moderate degenerative changes both hips and lumbar spine.  Cholecystectomy clips are present, mild levoconvex lumbar probation, degenerative changes of the spine and hips; impression: Nonobstructive bowel gas pattern.  Review of ultrasound right upper quadrant gallbladder report 10/18/2019:  Diffuse fatty infiltration of the liver, 2 small hepatic cysts, cholecystectomy, no biliary ductal dilatation.  Review of lumbar puncture, 03/26/2020: Indication for  toxic encephalopathy, clear CSF return through the needle, opening pressure 25 cm of water, hemostasis was achieved, no immediate complications.  8 mm clear CSF was collected and placed into 4 tubes.  FINDINGS:  # Ventricles: Normal without dilatation, mass effect, or midline shift.  # Brain parenchyma: Mild cerebral atrophy. There are 2 or 3 small nonspecific T2 hyperintensities in the white matter, essentially within normal limits for patient of this age. These are of doubtful significance. Diffusion-weighted images are normal  indicating no acute infarct. No evidence mass or hemorrhage. There is a developmental venous anomaly in the right cerebellar hemisphere. This is of no clinical significance. No additional area of abnormal enhancement.  # Sella and parasellar region: Unremarkable  # Craniovertebral junction: Normal.  # Mastoids and paranasal sinuses: Unremarkable  # Orbits: Unremarkable  # Major cerebral vessels and dural sinuses: Normal flow voids  # Bony structures and scalp: Unremarkable    IMPRESSION:  Mild cerebral atrophy. Small developmental venous anomaly in the right cerebellar hemisphere. Otherwise essentially normal MRI of the brain   HPI 07/10/2019: This is a wonderful 79 years old white female with history of stage IIIB non-small cell lung cancer status postcarboplatin and paclitaxel with resultant painful neuropathy.I've treated her in the past for neuropathy but here with new onset headaches. She has a flicker but no pain, last 30 secinds to a minute, they start at the temple ans shoots to the eye, sometimes on top, sometimes on the right, sometime in the back of the head, they are gentle, she has had 1-2 stabs but nothing significant, no hx of migraines in the family. Started in March right around the pandemic. No new medications. Nothing has changed. No inciting events. It can come and go for a few days. Eyes get very tired, no eye drooping, no lacrimation or  rhinorrhea, her eyes can get very tired, she feels her eyes hurt. She is having symptoms every day multiple times a day.    CT 2019 showed No acute intracranial abnormalities including mass lesion or mass effect, hydrocephalus, extra-axial fluid collection, midline shift, hemorrhage, or acute infarction, large ischemic events (personally reviewed images)    HPI: This is a wonderful 79 years old white female with history of stage IIIB non-small cell lung cancer status postcarboplatin and paclitaxel with resultant painful neuropathy. She is still on the Lyrica. Her left hand feels "sandy", all the fingers, feels like sand paper, she feels weakness in her left hand and is dropping, she has APB muscle wasting. Feet are stable, numb, no significant pain. New issue is the left hand symptoms.  Interval history 10/26/2016: She is here for painful neuropathy. Her feet are numb. Not much pain in the feet anymore. She has pain in the hands. She can grip things and has some strength but the left hand is worse than the right. She has done well on the medications but today would like to try Lyrica instead of long-acting gabapentin. She is on 1200-1863m of long-acting gabapentin so will start her at a higher dose of Lyrica 1522mtwice daily. Discussed side effects.    Interval History 08/12/2015:She has fallen and broken several bones in the right leg. She in a cast currently. She had to go to skilled nursing for several weeks. Her legs just buckled and she fell. It was late at night, she got  up to go to the bathroom before she went to sleep. She got lightheaded and she crashed onher ankle. She crawled over to the phone and called 911 and went to the emergency room. She was Dxed with low blood pressure. At a later incident, she was walking around the bed and just went down, no LOC, no SOB, no CP. Not lightheaded. Her legs just buckled, both of them, did not trip. No weakness, she can get out of low seats, she can  walk up stairs, no new weakness. She was very active all week, no illnesses or inciting events. Howvere she has had diarrhea for 2 months and she is dehydrated. She was placed on prednisone yesterday, cdiff was negative. She was not taking marinol. She was no taking vicadin or any pain killer at the time. Cymbalta is helping. Her left hand is getting number. She is not in pain with the neuropathy, more numbness. Before she gets up, she puts her head down to help with blood pressure. They decreased her BP medication, then stopped it entirely. Her glucose has been running very high, 8 days ago 542 and 1 day ago was 210 and she is following with her pcp.   Interval history 06/18/2015:4pm Tuesday feet started burning significantly, like on fire on the bootm. This was new. The left hand also started to burn. She has never had it before. The burning is better. It was very difficult to walk. She took ibuprofen and it helped. The burning went away in 20 minutes. The left more than the right. She had shoes on, no new shoes. She was reclining on her bed watching TV. Excruciating on the left. Does not feel the Lamictal is helping with the neuropathy or with mood.  Interval History 05/07/2015: This is a wonderful 79 years old white female with history of stage IIIB non-small cell lung cancer status postcarboplatin and paclitaxel with resultant painful neuropathy.The feet are about the same. Still on the Gralise. Lidocaine patches help somewhat. She is tolerating the Lamictal, no rashes. Discussed several options for her neuropathy, decided to slowly increase Lamictal to 3m then 771mand then 10032mThis will hopefully help with her neuropathy and is a great medication for mood. Discussed at length. Ambien ishelping her sleep. Tried Lyrica before the Gralise and that did not work. The Gralise has worked the best so far.   Interval History 11/13/2014: She is feeling better. She is taking the marinol and can sleep at  night. The pain is manageable during the day with the Gralise. She has a new diagnosis of diabetes. Her thyroid function is elevated. She takes daily B12. She is having some swelling in her legs which is manageable however it makes it difficult to drive. She still has tingling in the fingers.   Her doctors include  GreCarMaxDr MohHipolito Bayley. kohWilson Singernterval History 09/03/2014:She is still struggling with her peripheral neuropathy, nothing is helping. It is severe. Maybe the Gralise helped but then she ran out and didn't call for a prescription. She was recently given prednisone, cymbalta. Cymbalta made her symptoms worse. Started the Gralise and the 1800 mg worked. Belsomra didn't help. Ambien helps her sleep. Lyrica didn't work either for her peripheral neuropathy. Takes b12 daily and biotin. She is sleeping better with Ambien but the neuropathy is still severe. She can't fall asleep, possibly stress. Paresthesias are in the feet and hands. They occurred in the setting of platin-based chemotherapy. They are continuous,  worse at night when sleeping. She is also very emotional, crying a lot. Her appetite is very poor.  Visit 08/13/14:This is a very pleasant 79 years old white female with history of stage IIIB non-small cell lung cancer status postcarboplatin and paclitaxel. Patient reports she had chemotherapy and radiation from [place] - April 2nd. 6 weeks later started a second set of chemo, 3 sessions every 3 weeks. The neuropathy started about 3 weeks ago. Feels like burning, pins and needles and lightning strikes. Feet and left hand >right hand. It is in the entire feet to the ankles and entire left hand and digits 3-5 right hand. Severe, cramping in the feet and calfs. She is crying in the office today. Oxycodone not helping with the pain, doesn't even touch it. Didn't try Lyrica. Cymbalta worsened the symptoms. Nothing is helping. She is using a cane. No  falls. Balance if off. The pain is severe, 10/10 and keeps her from sleeping at night. Symptoms are continuous and never subside, but are worse at night.  Reviewed notes, labs and imaging from outside physicians, which showed: 1) Concurrent chemoradiation with weekly chemotherapy in the form of carboplatin for AUC of 2 and paclitaxel 45 mg/m2. Status post 7 week of treatment. Last dose was given 01/27/2014 no significant response in her disease.  2) Consolidation chemotherapy with carboplatin for AUC of 5 and paclitaxel 175 mg/M2 every 3 weeks with Neulasta support. First dose on 04/15/2014. Status post 3 cycles. Carboplatin was discontinued starting cycle #2 secondary to hypersensitivity reaction.  Her restaging scan showed evidence for disease progression and the patient is currently undergoing immunotherapy with Nivolumab 3 mg/KG every 2 weeks is status post 3 cycle   Review of Systems: Patient complains of symptoms per HPI as well as the following symptoms: falls, head trauma, dizziness . Pertinent negatives and positives per HPI. All others  negative    Social History   Socioeconomic History  . Marital status: Divorced    Spouse name: Not on file  . Number of children: 0  . Years of education: MA  . Highest education level: Master's degree (e.g., MA, MS, MEng, MEd, MSW, MBA)  Occupational History  . Occupation: Retired    Fish farm manager: OTHER  Tobacco Use  . Smoking status: Former Smoker    Packs/day: 3.00    Years: 45.00    Pack years: 135.00    Types: Cigarettes    Quit date: 12/05/1997    Years since quitting: 22.4  . Smokeless tobacco: Never Used  Vaping Use  . Vaping Use: Never used  Substance and Sexual Activity  . Alcohol use: No    Alcohol/week: 0.0 standard drinks    Comment: rarely   . Drug use: No  . Sexual activity: Not Currently  Other Topics Concern  . Not on file  Social History Narrative   Patient lives at home. She has an old friend who is her caregiver,  Gladys Damme, who stays with her 24/7.   Caffeine Use: 1 cups daily   Right handed   Social Determinants of Health   Financial Resource Strain:   . Difficulty of Paying Living Expenses:   Food Insecurity:   . Worried About Charity fundraiser in the Last Year:   . Arboriculturist in the Last Year:   Transportation Needs:   . Film/video editor (Medical):   [name] Lack of Transportation (Non-Medical):   Physical Activity:   . Days of Exercise per Week:   .  Minutes of Exercise per Session:   Stress:   . Feeling of Stress :   Social Connections:   . Frequency of Communication with Friends and Family:   . Frequency of Social Gatherings with Friends and Family:   . Attends Religious Services:   . Active Member of Clubs or Organizations:   . Attends Archivist Meetings:   [name] Marital Status:   Intimate Partner Violence:   . Fear of Current or Ex-Partner:   . Emotionally Abused:   [name] Physically Abused:   . Sexually Abused:     Family History  Problem Relation Age of Onset  . Other Mother   . Anuerysm Mother        brain  . Other Father        failure to thrive  . Hypertension Other   . Stroke Other   . Heart attack Other   . Hemachromatosis Other        great grandfather  . Rheum arthritis Other        both sets of grandparents and father  . Migraines Neg Hx   . Colon cancer Neg Hx   . Colon polyps Neg Hx   . Esophageal cancer Neg Hx   . Pancreatic cancer Neg Hx   . Stomach cancer Neg Hx   . Liver disease Neg Hx     Past Medical History:  Diagnosis Date  . Cancer ([place]) dx'd 09/2013   Lung ca  . Concussion 2018  . Diabetes ([place]) 09/08/2016   type I diabetic patient reported on 09/13/16   . Diabetes mellitus    insulin  . Diabetic neuropathy ([place]) 09/08/2016  . Fall 2018  . GERD (gastroesophageal reflux disease)    no meds for  . History of colitis   . History of hiatal hernia   . Hx of radiation therapy 12/16/13-01/30/14   lung 66Gy  .  Hypertension   . Malignant neoplasm of right upper lobe of lung ([place]) 11/28/2013  . Neuropathy   . Psoriasis   . Psoriatic arthritis ([place]) 09/08/2016  . Rheumatoid arthritis ([place])   . Shortness of breath dyspnea    with exertion    Past Surgical History:  Procedure Laterality Date  . ABDOMINAL HYSTERECTOMY    . ABDOMINAL SURGERY    . BALLOON DILATION N/A 12/12/2014   Procedure: BALLOON DILATION;  Surgeon: Inda Castle, MD;  Location: [facility];  Service: Endoscopy;  Laterality: N/A;  . BIOPSY  01/20/2020   Procedure: BIOPSY;  Surgeon: Doran Stabler, MD;  Location: [facility];  Service: Gastroenterology;;  . CHOLECYSTECTOMY    . COLONOSCOPY N/A 02/07/2016   Procedure: COLONOSCOPY;  Surgeon: Doran Stabler, MD;  Location: [facility];  Service: Endoscopy;  Laterality: N/A;  . DILATION AND CURETTAGE OF UTERUS    . ESOPHAGOGASTRODUODENOSCOPY N/A 12/12/2014   Procedure: ESOPHAGOGASTRODUODENOSCOPY (EGD);  Surgeon: Inda Castle, MD;  Location: [facility];  Service: Endoscopy;  Laterality: N/A;  with dilation  . ESOPHAGOGASTRODUODENOSCOPY (EGD) WITH PROPOFOL N/A 10/09/2014   Procedure: ESOPHAGOGASTRODUODENOSCOPY (EGD) WITH PROPOFOL;  Surgeon: Inda Castle, MD;  Location: [facility];  Service: Endoscopy;  Laterality: N/A;  . ESOPHAGOGASTRODUODENOSCOPY (EGD) WITH PROPOFOL N/A 09/19/2016   Procedure: ESOPHAGOGASTRODUODENOSCOPY (EGD) WITH PROPOFOL;  Surgeon: Doran Stabler, MD;  Location: [facility];  Service: Gastroenterology;  Laterality: N/A;  with savary dil tt  . ESOPHAGOGASTRODUODENOSCOPY (EGD) WITH PROPOFOL N/A 01/20/2020   Procedure: ESOPHAGOGASTRODUODENOSCOPY (EGD) WITH PROPOFOL;  Surgeon:  Doran Stabler, MD;  Location: [facility];  Service: Gastroenterology;  Laterality: N/A;  Fluoro-Savary  . ORIF ANKLE FRACTURE Right 07/04/2015   Procedure: OPEN REDUCTION INTERNAL FIXATION (ORIF) ANKLE FRACTURE;  Surgeon: Newt Minion, MD;  Location: [facility];  Service:  Orthopedics;  Laterality: Right;  OPEN REDUCTION, INTERNAL FIXATION OF RIGHT ANKLE FRACTURE.   [name] ORIF ANKLE FRACTURE Left 02/10/2016   Procedure: OPEN REDUCTION INTERNAL FIXATION (ORIF) ANKLE FRACTURE;  Surgeon: Newt Minion, MD;  Location: [facility];  Service: Orthopedics;  Laterality: Left;  . SAVORY DILATION N/A 10/09/2014   Procedure: SAVORY DILATION;  Surgeon: Inda Castle, MD;  Location: [facility];  Service: Endoscopy;  Laterality: N/A;  . SAVORY DILATION N/A 09/19/2016   Procedure: SAVORY DILATION;  Surgeon: Doran Stabler, MD;  Location: [facility];  Service: Gastroenterology;  Laterality: N/A;  . SAVORY DILATION N/A 01/20/2020   Procedure: SAVORY DILATION;  Surgeon: Doran Stabler, MD;  Location: [facility];  Service: Gastroenterology;  Laterality: N/A;  . TONSILLECTOMY      Current Outpatient Medications  Medication Sig Dispense Refill  . albuterol (PROVENTIL HFA;VENTOLIN HFA) 108 (90 Base) MCG/ACT inhaler Inhale 2 puffs into the lungs every 6 (six) hours as needed for wheezing or shortness of breath. 1 Inhaler 2  . amLODipine (NORVASC) 10 MG tablet TAKE 1 TABLET(10 MG) BY MOUTH DAILY (Patient taking differently: Take 10 mg by mouth daily. ) 90 tablet 1  . augmented betamethasone dipropionate (DIPROLENE-AF) 0.05 % ointment     . betamethasone dipropionate (DIPROLENE) 0.05 % cream Apply topically 2 (two) times daily. (Patient taking differently: Apply 1 application topically 2 (two) times daily. ) 45 g 0  . budesonide-formoterol (SYMBICORT) 80-4.5 MCG/ACT inhaler Inhale 2 puffs into the lungs 2 (two) times daily. (Patient taking differently: Inhale 2 puffs into the lungs daily as needed (Wheezing). ) 1 Inhaler 6  . DULoxetine (CYMBALTA) 60 MG capsule Take 1 capsule (60 mg total) by mouth 2 (two) times daily. (Patient taking differently: Take 60 mg by mouth daily. ) 180 capsule 4  . famotidine (PEPCID) 40 MG tablet Take 40 mg by mouth daily as needed for heartburn.     .  Fluocinolone Acetonide Scalp 0.01 % OIL     . fluocinonide (LIDEX) 0.05 % external solution     . gabapentin (NEURONTIN) 300 MG capsule TAKE 1 CAPSULE(300 MG) BY MOUTH THREE TIMES DAILY (Patient taking differently: Take 300 mg by mouth 3 (three) times daily. ) 270 capsule 4  . GVOKE HYPOPEN 1-PACK 1 MG/0.2ML SOAJ Inject under skin 0.2 ml as needed for hypoglycemia 0.2 mL prn  . insulin aspart (NOVOLOG FLEXPEN) 100 UNIT/ML FlexPen INJECT 7 UNITS UNDER THE SKIN THREE TIMES DAILY 15 mL 3  . insulin degludec (TRESIBA FLEXTOUCH) 100 UNIT/ML FlexTouch Pen Inject 0.27 mLs (27 Units total) into the skin daily. 5 pen 3  . Insulin Pen Needle (B-D UF III MINI PEN NEEDLES) 31G X 5 MM MISC Use with insulin pen 4 times a day to inject insulin 360 each 11  . ketoconazole (NIZORAL) 2 % cream     . metoprolol tartrate (LOPRESSOR) 50 MG tablet     . solifenacin (VESICARE) 10 MG tablet Take 10 mg by mouth daily.    . Continuous Blood Gluc Sensor (DEXCOM G6 SENSOR) MISC Apply new sensor every 10 days for continuous glucose monitoring. 3 each 3  . Continuous Blood Gluc Transmit (DEXCOM G6 TRANSMITTER) MISC Use one transmitter  every 3 months for continuous glucose monitoring. 1 each 6   No current facility-administered medications for this visit.    Allergies as of 04/29/2020 - Review Complete 04/29/2020  Allergen Reaction Noted  . Carboplatin Anaphylaxis, Shortness Of Breath, and Other (See Comments) 05/29/2014  . Remicade [infliximab] Anaphylaxis 05/13/2012  . Rifampin Other (See Comments) 01/16/2020  . Hydromorphone Rash 08/11/2014    Vitals: BP (!) 78/48 (BP Location: Right Arm, Patient Position: Sitting) Comment: manual recheck. MD aware and seeing pt.  Pulse 64   Ht '5\' 6"'  (1.676 m)   Wt 162 lb (73.5 kg)   SpO2 95%   BMI 26.15 kg/m  Last Weight:  Wt Readings from Last 1 Encounters:  04/29/20 162 lb (73.5 kg)   Last Height:   Ht Readings from Last 1 Encounters:  04/29/20 '5\' 6"'  (1.676 m)     Physical exam: Exam: Gen: NAD, conversant, well nourised, thin , well groomed                     CV: RRR, no MRG. No Carotid Bruits. + peripheral edema, warm, nontender Eyes: Conjunctivae clear without exudates or hemorrhage  Neuro: Detailed Neurologic Exam  Speech:    Speech is normal; fluent and spontaneous with normal comprehension.  Cognition:    The patient is oriented to person, place, and time;     recent and remote memory intact;     language fluent;     normal attention, concentration,     fund of knowledge Cranial Nerves:    The pupils are equal, round, and reactive to light. Visual fields are full to finger confrontation. Extraocular movements are intact. Trigeminal sensation is intact and the muscles of mastication are normal. The face is symmetric. The palate elevates in the midline. Hearing intact. Voice is normal. Shoulder shrug is normal. The tongue has normal motion without fasciculations.   Gait: Erect posture, mild imbalance with heel toe and tandem  Motor Observation:    No asymmetry, no atrophy, and no involuntary movements noted. Tone:    Normal muscle tone.    Posture:    Posture is normal. normal erect    Strength: 4/5 bilateral hip flexion otherwise strength is V/V in the upper and lower limbs.   Toes: bilat toe upgoing  Sensation: Decreased to all modalities midcalf, Romberg negative      Assessment/Plan: This is a wonderful 79 year old white female with history of stage IIIB non-small cell lung cancer status postcarboplatin and paclitaxel with resultant severe neuropathy. Exam shows severe loss of sensation in the distal extremities, +Romberg, Ataxic gait and Allodynia and hyperesthesias. This is a wonderful 79 years old white female with history of stage IIIB non-small cell lung cancer status postcarboplatin and paclitaxel with resultant painful neuropathy.I've treated her in the past for neuropathy.  She is here for follow-up after  hospitalization, she has a past medical history of lung cancer, obstructive sleep apnea, hypertension, polyneuropathy, GERD, hypertension, type 1 diabetes currently on insulin therapy with noncompliance to dietary restrictions, I reviewed notes from her admission and patient was admitted for DKA management/hyperglycemia she was found at home lying in bed unresponsive, to be in acute DKA with increased somnolence and subsequent lactic acid doses and acute metabolic encephalopathy as well as hyponatremia and admitted to the ICU.  -As far as her hospitalization is concerned, it appears she was in DKA, noncompliance to medical management and dietary restrictions, with metabolic encephalopathy.  She was noted to  be consistently hypertensive and metoprolol was added to her home dose of amlodipine, but today patient is hypotensive and I highly recommended that she remain well-hydrated (after minimal hydration here in the office her blood pressure was normal) and she follow-up with her primary care physician.  She was also noted to be hypoglycemic in the hospital but her basal insulin was adjusted.  - Otherwise neurologic exam is stable is stable, no worsening of neuropathy, her falls likely due to abnormal glucose as well as hypotension and she needs close management with pcp  -I also recommended physical therapy for her imbalance and follow-up with Dr. Quay Burow and Dr. Earlie Server as soon as possible.     Orthostatic BP Patient Position BP Location Orthostatic Pulse  04/29/20 1152 90/62 Standing Left Arm 64  04/29/20 1149 (!) 71/53 Standing Left Arm 67  04/29/20 1147 (!) 86/60 Sitting Left Arm 63  04/29/20      105/68      Supine       Left arm   61 1146   Patient denied any dizziness/new symptoms with each position change. Repeated sitting BP at 12:11 after pt drank 32 oz water: 120/66 HR 60.  Orders Placed This Encounter  Procedures  . Ambulatory referral to Physical Therapy   Note sent to pcp and  Dr.Mohammed: I recently saw patient for recent hospitalization. She had DKA and was admitted, medical non compliance per hospital notes, syncope and dizziness. She is also very hypotensive, may be experiencing orthostatic hypotension and may need her blood pressure meds adjusted. I asked her to follow up with you asap. Neuro exam stable and MRI brain stable so I do not think this is neurologic in etiology.  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   Orders Placed This Encounter  Procedures  . Ambulatory referral to Physical Therapy    - Neuropathy: improved, Doing well can try and titrate off of the lyrica, has gained weight and has peripheral swelling stopping the Lyrica may help. 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 Imbalance   2. Fall, initial encounter   3. Diabetic ketoacidosis with coma associated with type 1 diabetes mellitus ([place])   4. Hx of noncompliance with medical treatment, presenting hazards to health    diagnosis.  This included previsit chart review, lab review, study review, order entry, electronic health record documentation, patient education on the different  diagnostic and therapeutic options, counseling and coordination of care, risks and benefits of management, compliance, or risk factor reduction

## 2020-04-28 NOTE — Telephone Encounter (Signed)
See other phone note

## 2020-04-29 ENCOUNTER — Ambulatory Visit: Payer: Medicare PPO | Admitting: Neurology

## 2020-04-29 ENCOUNTER — Encounter: Payer: Self-pay | Admitting: Neurology

## 2020-04-29 VITALS — BP 78/48 | HR 64 | Ht 66.0 in | Wt 162.0 lb

## 2020-04-29 DIAGNOSIS — Z9119 Patient's noncompliance with other medical treatment and regimen: Secondary | ICD-10-CM | POA: Diagnosis not present

## 2020-04-29 DIAGNOSIS — R2689 Other abnormalities of gait and mobility: Secondary | ICD-10-CM | POA: Diagnosis not present

## 2020-04-29 DIAGNOSIS — E1011 Type 1 diabetes mellitus with ketoacidosis with coma: Secondary | ICD-10-CM | POA: Diagnosis not present

## 2020-04-29 DIAGNOSIS — Z91199 Patient's noncompliance with other medical treatment and regimen due to unspecified reason: Secondary | ICD-10-CM

## 2020-04-29 DIAGNOSIS — W19XXXA Unspecified fall, initial encounter: Secondary | ICD-10-CM

## 2020-04-29 NOTE — Patient Instructions (Addendum)
Assessment/Plan:  79 year old with COPD, still smoking, with chronic dizziness for 17 years when standing up, never when sitting. Likely orthostatic hypotension, blood pressure sitting 86/60 and standing 90/62 in the office today.. MRi of the brain was completed in 2017 and showed advanced white matter changes due to chronic microvascular disease likely from decades of heavy smoking. Orthostatic hypotension may correlates with well known cardiovascular risk factors such as hypertension, smoking, diabetes and kidney failure. Discussed smoking at length and advised cessation. We'll check carotid Dopplers for stenosis that could lead to dizziness and lightheadedness.    Non-Drug Treatment for Low Blood Pressure on Standing:  1. Changing Postures: . Change posture slowly when getting up, especially in the morning . Hold on to something during the first few minutes after standing up. Do not start walking as soon as you get up from the chair . Avoid prolonged recumbency or lying down . Raise the head of the bed by 10 to 20 degrees  2. Exercise: . Perform Isotonic exercise, e.g.recumbent bike, pedaling movements while sitting in a chair . Avoid exercises where you have to strain   3. Avoid Pooling of blood in legs: . Wear custom-fitted elastic stockings. The ones which extend to the abdomen work even better. Consider wearing an abdominal binder. . Perform physical counter-maneuvers, such as crossing legs and tensing leg muscles.  4. Eating and Drinking: . Small meals are recommended. Avoid large meals. Avoid standing suddenly after a large meal . Avoid alcohol . Increase intake of fluids and regular salt. A daily intake of up to 10 grams of sodium per day and a fluid intake of 2.0 to 2.5 liters per day (8 to 10 glasses of water) is recommended.  . Rapid (over 3 minutes) ingestion of approximately 0.5 liter (2 glasses of water) of tap water, raises blood pressure within 5 to 15 minutes and lasts for  an hour.  5. Other Tips: . Avoid hot baths. Instead take warm baths . Maintain a BP record standing and lying down . If you are only any BP lowering drugs (antihypertensives, diuretics, antidepressants, drugs for prostate, etc) ask your doctor to revisit the need to keep you on these drugs . If all non-drug therapy fails, ask your doctor about drug therapy   Follow-up with primary care physician.

## 2020-04-29 NOTE — Progress Notes (Signed)
Orthostatic VS for the past 72 hrs (Last 3 readings):  Orthostatic BP Patient Position BP Location Orthostatic Pulse  04/29/20 1152 90/62 Standing Left Arm 64  04/29/20 1149 (!) 71/53 Standing Left Arm 67  04/29/20 1147 (!) 86/60 Sitting Left Arm 63  04/29/20      105/68      Supine       Left arm   61 1146   Patient denied any dizziness/new symptoms with each position change.  Repeated sitting BP at 12:11 after pt drank 32 oz water: 120/66 HR 60.

## 2020-05-04 ENCOUNTER — Encounter: Payer: Self-pay | Admitting: Neurology

## 2020-05-04 DIAGNOSIS — W19XXXA Unspecified fall, initial encounter: Secondary | ICD-10-CM | POA: Insufficient documentation

## 2020-05-04 DIAGNOSIS — Z91199 Patient's noncompliance with other medical treatment and regimen due to unspecified reason: Secondary | ICD-10-CM | POA: Insufficient documentation

## 2020-05-05 DIAGNOSIS — H52223 Regular astigmatism, bilateral: Secondary | ICD-10-CM | POA: Diagnosis not present

## 2020-05-05 DIAGNOSIS — H25813 Combined forms of age-related cataract, bilateral: Secondary | ICD-10-CM | POA: Diagnosis not present

## 2020-05-05 DIAGNOSIS — Z794 Long term (current) use of insulin: Secondary | ICD-10-CM | POA: Diagnosis not present

## 2020-05-05 DIAGNOSIS — H5213 Myopia, bilateral: Secondary | ICD-10-CM | POA: Diagnosis not present

## 2020-05-05 DIAGNOSIS — E109 Type 1 diabetes mellitus without complications: Secondary | ICD-10-CM | POA: Diagnosis not present

## 2020-05-05 DIAGNOSIS — H524 Presbyopia: Secondary | ICD-10-CM | POA: Diagnosis not present

## 2020-05-05 LAB — HM DIABETES EYE EXAM

## 2020-05-14 ENCOUNTER — Telehealth: Payer: Self-pay | Admitting: Internal Medicine

## 2020-05-14 DIAGNOSIS — I959 Hypotension, unspecified: Secondary | ICD-10-CM

## 2020-05-14 NOTE — Telephone Encounter (Signed)
Spoke with patient.  She reports she is not checking her BP at home. She said when she went to her last 2 doctors appointments her BP was low. She is still taking her current BP meds as well.

## 2020-05-14 NOTE — Telephone Encounter (Signed)
Referral ordered.   Is she monitoring her BP at home?  If so is it still low?  She is still taking her BP medications which will make her BP low?

## 2020-05-14 NOTE — Telephone Encounter (Signed)
° ° °  Patient requesting referral to [facility]; hypotension She hasn't seen Dr Sallyanne Kuster in 3 years, new referral needed

## 2020-05-15 NOTE — Telephone Encounter (Signed)
She needs to get a BP cuff and check her BP at home.  If her BP is still low we need to decrease her current BP medications.

## 2020-05-15 NOTE — Telephone Encounter (Signed)
Called and left message for patient on home number recommendations per Dr. Quay Burow.

## 2020-05-18 ENCOUNTER — Ambulatory Visit: Payer: Medicare PPO | Admitting: Internal Medicine

## 2020-05-20 ENCOUNTER — Encounter: Payer: Self-pay | Admitting: Internal Medicine

## 2020-05-20 NOTE — Progress Notes (Signed)
Outside notes received. Information abstracted. Notes sent to scan.  

## 2020-05-25 ENCOUNTER — Ambulatory Visit: Payer: Medicare PPO | Admitting: Internal Medicine

## 2020-06-05 DIAGNOSIS — L400 Psoriasis vulgaris: Secondary | ICD-10-CM | POA: Diagnosis not present

## 2020-06-10 ENCOUNTER — Telehealth: Payer: Self-pay | Admitting: Internal Medicine

## 2020-06-10 NOTE — Telephone Encounter (Signed)
Rescheduled appointment per 8/11 scheduling message. Patient is aware of updated appointment date and time.

## 2020-06-30 ENCOUNTER — Other Ambulatory Visit: Payer: Self-pay

## 2020-06-30 ENCOUNTER — Encounter: Payer: Self-pay | Admitting: Cardiovascular Disease

## 2020-06-30 ENCOUNTER — Ambulatory Visit: Payer: Medicare PPO | Admitting: Cardiovascular Disease

## 2020-06-30 VITALS — BP 105/68 | HR 102 | Ht 66.0 in | Wt 158.6 lb

## 2020-06-30 DIAGNOSIS — E782 Mixed hyperlipidemia: Secondary | ICD-10-CM

## 2020-06-30 DIAGNOSIS — E1042 Type 1 diabetes mellitus with diabetic polyneuropathy: Secondary | ICD-10-CM

## 2020-06-30 DIAGNOSIS — R55 Syncope and collapse: Secondary | ICD-10-CM | POA: Diagnosis not present

## 2020-06-30 DIAGNOSIS — I10 Essential (primary) hypertension: Secondary | ICD-10-CM | POA: Diagnosis not present

## 2020-06-30 MED ORDER — PRAVASTATIN SODIUM 40 MG PO TABS
40.0000 mg | ORAL_TABLET | Freq: Every evening | ORAL | 3 refills | Status: DC
Start: 2020-06-30 — End: 2021-07-20

## 2020-06-30 NOTE — Patient Instructions (Addendum)
Medication Instructions:  START Pravastatin 40 mg once daily  *If you need a refill on your cardiac medications before your next appointment, please call your pharmacy*   Lab Work: Your provider would like for you to have the following labs: Lipid  If you have labs (blood work) drawn today and your tests are completely normal, you will receive your results only by: [name] MyChart Message (if you have MyChart) OR . A paper copy in the mail If you have any lab test that is abnormal or we need to change your treatment, we will call you to review the results.   Testing/Procedures: None ordered   Follow-Up: At [facility], you and your health needs are our priority.  As part of our continuing mission to provide you with exceptional heart care, we have created designated Provider Care Teams.  These Care Teams include your primary Cardiologist (physician) and Advanced Practice Providers (APPs -  Physician Assistants and Nurse Practitioners) who all work together to provide you with the care you need, when you need it.  We recommend signing up for the patient portal called "MyChart".  Sign up information is provided on this After Visit Summary.  MyChart is used to connect with patients for Virtual Visits (Telemedicine).  Patients are able to view lab/test results, encounter notes, upcoming appointments, etc.  Non-urgent messages can be sent to your provider as well.   To learn more about what you can do with MyChart, go to [email or web address].    Your next appointment:   12 month(s)  The format for your next appointment:   In Person  Provider:   You may see Sanda Klein, MD or one of the following Advanced Practice Providers on your designated Care Team:    Almyra Deforest, PA-C  Fabian Sharp, PA-C or   Roby Lofts, [place]

## 2020-06-30 NOTE — Progress Notes (Signed)
Patient ID: Megan [name], female   DOB: [birth year], 79 y.o.   MRN: [id number]    Cardiology Office Note    Date:  06/30/2020   ID:  Megan, [name] [birth year], MRN [id number]  PCP:  Binnie Rail, MD  Cardiologist:   Sanda Klein, MD   Chief Complaint  Patient presents with  . Loss of Consciousness    History of Present Illness:  Megan [name] is a 79 y.o. female after experiencing a syncopal event associated with hypotension.  This is the first time we are seeing her since 2017.  She has not had any problems with dyspnea at rest or with exertion and denies orthopnea, lower extremity edema, angina at rest or with activity or intermittent claudication.  She has not had any focal neurological events, but has had several episodes of presyncope and full-blown syncope.  No arrhythmia has been detected during her recent medical evaluations.  After her initial episode of syncope in 2017 she wore an event monitor that showed no arrhythmia and her echocardiogram showed normal findings.  She has late onset type 1 diabetes mellitus (LADA) since 2013 and is followed by Dr. Cruzita Lederer.  Her compliance with dietary restrictions has only recently improved, but historically her diabetes has been poorly controlled.  Most recent hemoglobin A1c 9.6%.  She has mixed hyperlipidemia.  She has a history of stage IIIb non-small cell lung cancer with successful carboplatin/paclitaxel chemotherapy with stable tumor size for about 5 years (Dr. Julien Nordmann).  She has residual pain from neuropathy from chemotherapy but is no longer receiving active treatment.  Additional medical problems include a history of hypertension, obstructive sleep apnea, GERD complicated by stricture requiring recent esophagoplasty (Dr. Loletha Carrow 2021), ulcerative colitis.  She has had multiple falls and these appear to be related to hypotension, which in turn has been associated with severe hyperglycemia and glucosuria with  dehydration.  She was found unconscious by her husband, her glucose was 1058, hospitalized in [place] for about a week in May 2021.  Extensive evaluation included brain CT and MRI and lumbar puncture, without major abnormalities identified.  She does have a family history of coronary artery disease.  She quit  smoking >20 years ago (history of 45 pack year smoking, quit 1999). Multiple chest CTs have shown very little atherosclerotic calcification in her coronary distribution. Her echocardiogram in January 2017 was essentially a normal study.    Past Medical History:  Diagnosis Date  . Cancer ([place]) dx'd 09/2013   Lung ca  . Concussion 2018  . Diabetes ([place]) 09/08/2016   type I diabetic patient reported on 09/13/16   . Diabetes mellitus    insulin  . Diabetic neuropathy ([place]) 09/08/2016  . Fall 2018  . GERD (gastroesophageal reflux disease)    no meds for  . History of colitis   . History of hiatal hernia   . Hx of radiation therapy 12/16/13-01/30/14   lung 66Gy  . Hypertension   . Malignant neoplasm of right upper lobe of lung ([place]) 11/28/2013  . Neuropathy   . Psoriasis   . Psoriatic arthritis ([place]) 09/08/2016  . Rheumatoid arthritis ([place])   . Shortness of breath dyspnea    with exertion    Past Surgical History:  Procedure Laterality Date  . ABDOMINAL HYSTERECTOMY    . ABDOMINAL SURGERY    . BALLOON DILATION N/A 12/12/2014   Procedure: BALLOON DILATION;  Surgeon: Inda Castle, MD;  Location: [facility];  Service: Endoscopy;  Laterality:  N/A;  . BIOPSY  01/20/2020   Procedure: BIOPSY;  Surgeon: Doran Stabler, MD;  Location: [facility];  Service: Gastroenterology;;  . CHOLECYSTECTOMY    . COLONOSCOPY N/A 02/07/2016   Procedure: COLONOSCOPY;  Surgeon: Doran Stabler, MD;  Location: [facility]COPY;  Service: Endoscopy;  Laterality: N/A;  . DILATION AND CURETTAGE OF UTERUS    . ESOPHAGOGASTRODUODENOSCOPY N/A 12/12/2014   Procedure: ESOPHAGOGASTRODUODENOSCOPY (EGD);   Surgeon: Inda Castle, MD;  Location: [facility];  Service: Endoscopy;  Laterality: N/A;  with dilation  . ESOPHAGOGASTRODUODENOSCOPY (EGD) WITH PROPOFOL N/A 10/09/2014   Procedure: ESOPHAGOGASTRODUODENOSCOPY (EGD) WITH PROPOFOL;  Surgeon: Inda Castle, MD;  Location: [facility];  Service: Endoscopy;  Laterality: N/A;  . ESOPHAGOGASTRODUODENOSCOPY (EGD) WITH PROPOFOL N/A 09/19/2016   Procedure: ESOPHAGOGASTRODUODENOSCOPY (EGD) WITH PROPOFOL;  Surgeon: Doran Stabler, MD;  Location: [facility];  Service: Gastroenterology;  Laterality: N/A;  with savary dil tt  . ESOPHAGOGASTRODUODENOSCOPY (EGD) WITH PROPOFOL N/A 01/20/2020   Procedure: ESOPHAGOGASTRODUODENOSCOPY (EGD) WITH PROPOFOL;  Surgeon: Doran Stabler, MD;  Location: [facility];  Service: Gastroenterology;  Laterality: N/A;  Fluoro-Savary  . ORIF ANKLE FRACTURE Right 07/04/2015   Procedure: OPEN REDUCTION INTERNAL FIXATION (ORIF) ANKLE FRACTURE;  Surgeon: Newt Minion, MD;  Location: [facility];  Service: Orthopedics;  Laterality: Right;  OPEN REDUCTION, INTERNAL FIXATION OF RIGHT ANKLE FRACTURE.   [name] ORIF ANKLE FRACTURE Left 02/10/2016   Procedure: OPEN REDUCTION INTERNAL FIXATION (ORIF) ANKLE FRACTURE;  Surgeon: Newt Minion, MD;  Location: [facility];  Service: Orthopedics;  Laterality: Left;  . SAVORY DILATION N/A 10/09/2014   Procedure: SAVORY DILATION;  Surgeon: Inda Castle, MD;  Location: [facility];  Service: Endoscopy;  Laterality: N/A;  . SAVORY DILATION N/A 09/19/2016   Procedure: SAVORY DILATION;  Surgeon: Doran Stabler, MD;  Location: [facility];  Service: Gastroenterology;  Laterality: N/A;  . SAVORY DILATION N/A 01/20/2020   Procedure: SAVORY DILATION;  Surgeon: Doran Stabler, MD;  Location: [facility];  Service: Gastroenterology;  Laterality: N/A;  . TONSILLECTOMY      Current Medications: Outpatient Medications Prior to Visit  Medication Sig Dispense Refill  . albuterol (PROVENTIL HFA;VENTOLIN  HFA) 108 (90 Base) MCG/ACT inhaler Inhale 2 puffs into the lungs every 6 (six) hours as needed for wheezing or shortness of breath. 1 Inhaler 2  . amLODipine (NORVASC) 10 MG tablet TAKE 1 TABLET(10 MG) BY MOUTH DAILY (Patient taking differently: Take 10 mg by mouth daily. ) 90 tablet 1  . augmented betamethasone dipropionate (DIPROLENE-AF) 0.05 % ointment     . betamethasone dipropionate (DIPROLENE) 0.05 % cream Apply topically 2 (two) times daily. (Patient taking differently: Apply 1 application topically 2 (two) times daily. ) 45 g 0  . budesonide-formoterol (SYMBICORT) 80-4.5 MCG/ACT inhaler Inhale 2 puffs into the lungs 2 (two) times daily. (Patient taking differently: Inhale 2 puffs into the lungs daily as needed (Wheezing). ) 1 Inhaler 6  . Continuous Blood Gluc Sensor (DEXCOM G6 SENSOR) MISC Apply new sensor every 10 days for continuous glucose monitoring. 3 each 3  . Continuous Blood Gluc Transmit (DEXCOM G6 TRANSMITTER) MISC Use one transmitter every 3 months for continuous glucose monitoring. 1 each 6  . DULoxetine (CYMBALTA) 60 MG capsule Take 1 capsule (60 mg total) by mouth 2 (two) times daily. (Patient taking differently: Take 60 mg by mouth daily. ) 180 capsule 4  . famotidine (PEPCID) 40 MG tablet Take 40 mg by mouth  daily as needed for heartburn.     . Fluocinolone Acetonide Scalp 0.01 % OIL     . fluocinonide (LIDEX) 0.05 % external solution     . gabapentin (NEURONTIN) 300 MG capsule TAKE 1 CAPSULE(300 MG) BY MOUTH THREE TIMES DAILY (Patient taking differently: Take 300 mg by mouth 3 (three) times daily. ) 270 capsule 4  . GVOKE HYPOPEN 1-PACK 1 MG/0.2ML SOAJ Inject under skin 0.2 ml as needed for hypoglycemia 0.2 mL prn  . insulin aspart (NOVOLOG FLEXPEN) 100 UNIT/ML FlexPen INJECT 7 UNITS UNDER THE SKIN THREE TIMES DAILY 15 mL 3  . insulin degludec (TRESIBA FLEXTOUCH) 100 UNIT/ML FlexTouch Pen Inject 0.27 mLs (27 Units total) into the skin daily. 5 pen 3  . Insulin Pen Needle (B-D  UF III MINI PEN NEEDLES) 31G X 5 MM MISC Use with insulin pen 4 times a day to inject insulin 360 each 11  . ketoconazole (NIZORAL) 2 % cream     . metoprolol tartrate (LOPRESSOR) 50 MG tablet     . solifenacin (VESICARE) 10 MG tablet Take 10 mg by mouth daily.     No facility-administered medications prior to visit.     Allergies:   Carboplatin, Remicade [infliximab], Rifampin, and Hydromorphone   Social History   Socioeconomic History  . Marital status: Divorced    Spouse name: Not on file  . Number of children: 0  . Years of education: MA  . Highest education level: Master's degree (e.g., MA, MS, MEng, MEd, MSW, MBA)  Occupational History  . Occupation: Retired    Fish farm manager: [employer]  Tobacco Use  . Smoking status: Former Smoker    Packs/day: 3.00    Years: 45.00    Pack years: 135.00    Types: Cigarettes    Quit date: 12/05/1997    Years since quitting: 22.5  . Smokeless tobacco: Never Used  Vaping Use  . Vaping Use: Never used  Substance and Sexual Activity  . Alcohol use: No    Alcohol/week: 0.0 standard drinks    Comment: rarely   . Drug use: No  . Sexual activity: Not Currently  Other Topics Concern  . Not on file  Social History Narrative   Patient lives at home. She has an old friend who is her caregiver, Gladys Damme, who stays with her 24/7.   Caffeine Use: 1 cups daily   Right handed   Social Determinants of Health   Financial Resource Strain:   . Difficulty of Paying Living Expenses: Not on file  Food Insecurity:   . Worried About Charity fundraiser in the Last Year: Not on file  . Ran Out of Food in the Last Year: Not on file  Transportation Needs:   . Lack of Transportation (Medical): Not on file  . Lack of Transportation (Non-Medical): Not on file  Physical Activity:   . Days of Exercise per Week: Not on file  . Minutes of Exercise per Session: Not on file  Stress:   . Feeling of Stress : Not on file  Social Connections:   . Frequency of  Communication with Friends and Family: Not on file  . Frequency of Social Gatherings with Friends and Family: Not on file  . Attends Religious Services: Not on file  . Active Member of Clubs or Organizations: Not on file  . Attends Archivist Meetings: Not on file  . Marital Status: Not on file     Family History:  The patient's family history  includes Anuerysm in her mother; Heart attack in an other family member; Hemachromatosis in an other family member; Hypertension in an other family member; Other in her father and mother; Rheum arthritis in an other family member; Stroke in an other family member.   ROS:   Please see the history of present illness.    ROS All other systems reviewed and are negative.   PHYSICAL EXAM:   VS:  BP 105/68   Pulse (!) 102   Ht 5\' 6"  (1.676 m)   Wt 158 lb 9.6 oz (71.9 kg)   SpO2 97%   BMI 25.60 kg/m    GEN: Appears thin and frail, well developed, in no acute distress  HEENT: normal  Neck: no JVD, carotid bruits, or masses Cardiac: RRR; no murmurs, rubs, or gallops,no edema  Respiratory:  clear to auscultation bilaterally, normal work of breathing GI: soft, nontender, nondistended, + BS MS: no deformity or atrophy  Skin: warm and dry, no rash Neuro:  Alert and Oriented x 3, Strength and sensation are intact Psych: euthymic mood, full affect  Wt Readings from Last 3 Encounters:  06/30/20 158 lb 9.6 oz (71.9 kg)  04/29/20 162 lb (73.5 kg)  04/09/20 159 lb (72.1 kg)      Studies/Labs Reviewed:   EKG:  EKG is ordered today.  It shows normal sinus rhythm/mild sinus tachycardia, questionable left atrial abnormality, no acute repolarization abnormalities, borderline QTC 457 ms Recent Labs: 02/03/2020: ALT 19; BUN 16; Creatinine 1.09; Hemoglobin 14.4; Platelet Count 208; Potassium 4.2; Sodium 140   Additional studies/ records that were reviewed today include:  Notes from her recent endocrinology visit with Dr. Cruzita Lederer, her last neurology  visit with Dr. Jaynee Eagles, her EGD/esophagoplasty with Dr. Loletha Carrow and records from her admission in [place].    ASSESSMENT:    1. Syncope Secondary to hypotension      PLAN:  In order of problems listed above:  1.  Syncope: As occurred in 2017, Mrs. Dehaven's syncopal event this highly compatible with hypotension related to hypovolemia in the setting of uncontrolled hyperglycemia.  She had no evidence of structural heart disease on previous echocardiography when she last had syncope.  She should stay very well-hydrated and the focus should be on aggressive maintenance of euglycemia. 2.  Mixed hyperlipidemia: She does not have known CAD or PAD.  Target LDL cholesterol should be less than 100, if possible less than 70.  To avoid any worsening of her hyperglycemia, we will start with pravastatin 40 mg at bedtime daily. .  She is due to have a repeat lipid profile in about 3 months and she can get that at her endocrinology office appointment.  Based on those results we can decide on alternative lipid-lowering therapy if necessary.  I am sure that her mild hypertriglyceridemia will improve with better diabetes control. 3. DM type I, insulin-dependent: Her compliance with dietary recommendations has been extremely poor over the last 5 years, but she tells me that she finally understands her need to avoid sweets and starches.  We discussed this in some length today. 4.  Postchemotherapy polyneuropathy: Not sure if this is not also compounded by diabetic polyneuropathy, which may be compounding her tendency to develop hypotension when hypochloremic. 5. HTN: Well-controlled on amlodipine.  Avoid diuretics.   Medication Adjustments/Labs and Tests Ordered: Current medicines are reviewed at length with the patient today.  Concerns regarding medicines are outlined above.  Medication changes, Labs and Tests ordered today are listed in the Patient Instructions  below. Patient Instructions  Medication  Instructions:  START Pravastatin 40 mg once daily  *If you need a refill on your cardiac medications before your next appointment, please call your pharmacy*   Lab Work: Your provider would like for you to have the following labs: Lipid  If you have labs (blood work) drawn today and your tests are completely normal, you will receive your results only by: [name] MyChart Message (if you have MyChart) OR . A paper copy in the mail If you have any lab test that is abnormal or we need to change your treatment, we will call you to review the results.   Testing/Procedures: None ordered   Follow-Up: At [facility], you and your health needs are our priority.  As part of our continuing mission to provide you with exceptional heart care, we have created designated Provider Care Teams.  These Care Teams include your primary Cardiologist (physician) and Advanced Practice Providers (APPs -  Physician Assistants and Nurse Practitioners) who all work together to provide you with the care you need, when you need it.  We recommend signing up for the patient portal called "MyChart".  Sign up information is provided on this After Visit Summary.  MyChart is used to connect with patients for Virtual Visits (Telemedicine).  Patients are able to view lab/test results, encounter notes, upcoming appointments, etc.  Non-urgent messages can be sent to your provider as well.   To learn more about what you can do with MyChart, go to [email or web address].    Your next appointment:   12 month(s)  The format for your next appointment:   In Person  Provider:   You may see Sanda Klein, MD or one of the following Advanced Practice Providers on your designated Care Team:    Almyra Deforest, PA-C  Fabian Sharp, [place] or   Roby Lofts, PA-C       Signed, Sanda Klein, MD  06/30/2020 4:00 PM    [address], [place], [place]  [address] Phone: [phone]; Fax: [phone]

## 2020-07-02 ENCOUNTER — Other Ambulatory Visit: Payer: Self-pay | Admitting: Internal Medicine

## 2020-07-09 NOTE — Addendum Note (Signed)
Addended by: Zebedee Iba on: 07/09/2020 01:30 PM   Modules accepted: Orders

## 2020-07-13 ENCOUNTER — Telehealth: Payer: Self-pay | Admitting: Neurology

## 2020-07-13 MED ORDER — DULOXETINE HCL 60 MG PO CPEP
60.0000 mg | ORAL_CAPSULE | Freq: Two times a day (BID) | ORAL | 0 refills | Status: DC
Start: 2020-07-13 — End: 2020-11-09

## 2020-07-13 NOTE — Telephone Encounter (Signed)
I called the pt and let her know that I spoke with Dr Jaynee Eagles and we have sent 68 capsules to [facility] [address]. Pt aware of good rx. She was also advised to discuss her depression with Dr Quay Burow and she may need to refer to psychiatry. The pt verbalized appreciation and understanding.

## 2020-07-13 NOTE — Telephone Encounter (Signed)
Pt left voicemail @ 10:21 stating Dr Jaynee Eagles prescribed a medication for her years ago that begins with the letter D(blue and white capsule).  Pt has lost the bottle.  she states neither [facility] or [facility] will fill.  Pt states she very much needs this medication and would like to know if any samples are available.  Pt is asking to be called.

## 2020-07-13 NOTE — Telephone Encounter (Signed)
I checked goodrx. Patient can pay cash, approx $16 for a 30 day supply of Duloxetine at [facility]. Pt would like to use [employer].

## 2020-07-13 NOTE — Addendum Note (Signed)
Addended by: Gildardo Griffes on: 07/13/2020 05:22 PM   Modules accepted: Orders
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
## 2020-07-28 ENCOUNTER — Telehealth: Payer: Self-pay | Admitting: Physical Therapy

## 2020-07-28 ENCOUNTER — Encounter: Payer: Self-pay | Admitting: Family Medicine

## 2020-07-28 ENCOUNTER — Ambulatory Visit (INDEPENDENT_AMBULATORY_CARE_PROVIDER_SITE_OTHER): Payer: Medicare PPO

## 2020-07-28 ENCOUNTER — Ambulatory Visit: Payer: Medicare PPO | Admitting: Family Medicine

## 2020-07-28 ENCOUNTER — Other Ambulatory Visit: Payer: Self-pay

## 2020-07-28 VITALS — BP 100/78 | HR 103 | Ht 66.0 in | Wt 160.0 lb

## 2020-07-28 DIAGNOSIS — M79671 Pain in right foot: Secondary | ICD-10-CM | POA: Diagnosis not present

## 2020-07-28 DIAGNOSIS — S92511A Displaced fracture of proximal phalanx of right lesser toe(s), initial encounter for closed fracture: Secondary | ICD-10-CM | POA: Diagnosis not present

## 2020-07-28 DIAGNOSIS — R52 Pain, unspecified: Secondary | ICD-10-CM
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
## 2020-07-28 NOTE — Addendum Note (Signed)
Addended by: Gregor Hams on: 07/28/2020 04:31 PM   Modules accepted: Orders

## 2020-07-28 NOTE — Telephone Encounter (Signed)
Referral to [facility] sent.  You should hear from them soon.

## 2020-07-28 NOTE — Patient Instructions (Signed)
Thank you for coming in today.  Use supportive comfortable shoes.   Recheck in 2-4 weeks.   Try using Voltaren gel on the foot. It is over the counter.   You should hear from my office the xray results in a day or two.

## 2020-07-28 NOTE — Telephone Encounter (Signed)
Pt would like the name of a good vascular surgeon for some sort of vein issue she has behind her R knee.  Please advise.

## 2020-07-29 NOTE — Progress Notes (Signed)
X-ray does show a recommend using a postop shoe or buddy tape.  Recheck in 1 month

## 2020-07-30 ENCOUNTER — Telehealth: Payer: Self-pay | Admitting: Internal Medicine

## 2020-07-30 NOTE — Telephone Encounter (Signed)
Called and spoke with patient. R/s 10/11 appt - per provider on call. Confirmed new date and time

## 2020-08-04 ENCOUNTER — Other Ambulatory Visit: Payer: Self-pay

## 2020-08-04 ENCOUNTER — Ambulatory Visit (INDEPENDENT_AMBULATORY_CARE_PROVIDER_SITE_OTHER): Payer: Medicare PPO | Admitting: Internal Medicine

## 2020-08-04 ENCOUNTER — Encounter: Payer: Self-pay | Admitting: Internal Medicine

## 2020-08-04 ENCOUNTER — Other Ambulatory Visit: Payer: Self-pay | Admitting: *Deleted

## 2020-08-04 ENCOUNTER — Other Ambulatory Visit: Payer: Medicare PPO

## 2020-08-04 VITALS — BP 102/60 | HR 102 | Ht 66.0 in | Wt 158.0 lb

## 2020-08-04 DIAGNOSIS — E139 Other specified diabetes mellitus without complications: Secondary | ICD-10-CM

## 2020-08-04 DIAGNOSIS — R609 Edema, unspecified: Secondary | ICD-10-CM

## 2020-08-04 DIAGNOSIS — E782 Mixed hyperlipidemia: Secondary | ICD-10-CM

## 2020-08-04 DIAGNOSIS — E1149 Type 2 diabetes mellitus with other diabetic neurological complication: Secondary | ICD-10-CM | POA: Diagnosis not present

## 2020-08-04 LAB — LIPID PANEL
Cholesterol: 164 mg/dL (ref 0–200)
HDL: 49.5 mg/dL (ref >39.00)
LDL Cholesterol: 97 mg/dL (ref 0–99)
NonHDL: 114.51
Total CHOL/HDL Ratio: 3
Triglycerides: 86 mg/dL (ref 0.0–149.0)
VLDL: 17.2 mg/dL (ref 0.0–40.0)

## 2020-08-04 LAB — POCT GLYCOSYLATED HEMOGLOBIN (HGB A1C): Hemoglobin A1C: 9.3 % — AB (ref 4.0–5.6)

## 2020-08-04 MED ORDER — TRESIBA FLEXTOUCH 100 UNIT/ML ~~LOC~~ SOPN
30.0000 [IU] | PEN_INJECTOR | Freq: Every day | SUBCUTANEOUS | 3 refills | Status: DC
Start: 2020-08-04 — End: 2020-11-16

## 2020-08-04 MED ORDER — NOVOLOG FLEXPEN 100 UNIT/ML ~~LOC~~ SOPN
PEN_INJECTOR | SUBCUTANEOUS | 3 refills | Status: DC
Start: 2020-08-04 — End: 2021-01-12

## 2020-08-04 NOTE — Progress Notes (Addendum)
Patient ID: Megan [name], female   DOB: 04/26/1941, 79 y.o.   MRN: [id number]  This visit occurred during the SARS-CoV-2 public health emergency.  Safety protocols were in place, including screening questions prior to the visit, additional usage of staff PPE, and extensive cleaning of exam room while observing appropriate contact time as indicated for disinfecting solutions.   HPI: Megan [name] is a 79 y.o.-year-old female, presenting for f/u for DM2, dx in 2013, and as LADA in 04/2016, insulin-dependent since 2014, uncontrolled, with complications (DKA, ? Related PN).  At last visit, she returned after long absence of more than a year.  Our last visit was 4 months ago-she did not return in 1 month as advised.    A week prior to our last visit, her friend found her in a coma >> taken to the hospital >> Glu 1058.  Before this, patient was eating a lot of sugary desserts (which he decided to eliminate since then).  Also, she was not taking the insulin as recommended at last visit (see below).  Moreover, her Dexcom CGM was not approved as we could not send quarterly reports of her disease since she was not seen in more than a year.  They wanted to go back to her CGM which I sent to her pharmacy.  Since last visit, especially after she started her CGM, sugars have not been as fluctuating as before.  Also, she did not have a severe lows as before.  Reviewed history: She has a complicated medical history, with a diagnosis of ulcerative colitis with significant diarrhea that caused dehydration in the past and episodes of DKA. She had 2 admissions for this in 02/26/2016 and 04/14/2016. While in rehabilitation, her CBGs dropped to 30s on her previous dose of insulin, therefore, her long-acting insulin was decreased.   She also has a history of lung cancer diagnosed 09/2013.  She had chemotherapy and radiation therapy for this.  Reviewed HbA1c levels: Lab Results  Component Value Date   HGBA1C  9.6 (A) 04/09/2020   HGBA1C 9.4 (H) 11/18/2019   HGBA1C 11.3 (H) 05/10/2019  12/30/2015: 12% 09/18/2015: 11.3% 06/17/2015: 7.5% 04/08/2015: 10.9% 09/10/2014: 8.5%  11/04/2013: 6.2%  At last visit she was not following the instructions given: - Levemir  >> 12 >> 10 units 1-2x a day (may skip am doses)   - Humalog mealtime dose  - breakfast and dinner   - smaller meal: 15 units     Please do not skip the insulin if sugars are 70-100.   If the sugars are 60-70, take 1/2 of the Humalog dose.  If the sugars are <60, skip insulin with that meal  No Humalog at bedtime, unless sugars >300: take 5 units or >400: 8 units She had a SSI before, but was not using it. She inquired about an insulin pump in the past, but after we discussed about the complexity of managing this, we decided to continue with insulin pens.  We changed to: - Tresiba 22 >> 27 units daily in am - Humalog >> NovoLog mealtime dose - 7 units before meals    (not using)  (not using)  Please do not skip the insulin if sugars are 70-100.   If the sugars are 60-70, take 1/2 of the Humalog dose.  If the sugars are <60, skip insulin with that meal  No Humalog at bedtime.  She checks her sugars more than 4 times a day with her Dexcom G6: CGM parameters: - Average  from CGM: 243 +/-77 - Percent time CGM active 94.5% - Coefficient of variation: 31.7%  Time in range:  - very low (<54): 0.0% - low (<70): 0.4% - normal range (70-180): 21% - high sugars (>180): 78.7% - very high sugars (>250): 45.5%    Previously:   Previously:   Lowest sugar was 29 >> 45 >> 32 (after lunch) >> 59; she has hypoglycemia awareness in the 50s. Highest sugar was 528 >> 350 >> 1058 - but after hospitalization: 429 (at night) >> 350.  Pt's meals are: - Breakfast: Cereal or oatmeal with blueberries or eggs with sausage - Lunch: Sandwich and soup - Dinner: Meat, vegetables, small amount of starch - Snacks: 3-4 a day: Peanuts  -+ Mild  CKD, last BUN/creatinine:  Lab Results  Component Value Date   BUN 16 02/03/2020   BUN 20 11/18/2019   CREATININE 1.09 (H) 02/03/2020   CREATININE 1.09 11/18/2019   -+ HL: last set of lipids: Lab Results  Component Value Date   CHOL 216 (H) 05/10/2019   HDL 48.70 05/10/2019   LDLCALC 128 (H) 11/14/2016   LDLDIRECT 156.0 05/10/2019   TRIG 229.0 (H) 05/10/2019   CHOLHDL 4 05/10/2019  On pravastatin 40.  - last eye exam was on 05/05/2020: No DR; Dr. Lady Gary. -She has numbness but no tingling in her feet.  She has PN from ChTx and possibly also from diabetes.  Off Lyrica.  ROS: Constitutional: no weight gain/no weight loss, no fatigue, no subjective hyperthermia, no subjective hypothermia Eyes: no blurry vision, no xerophthalmia ENT: no sore throat, no nodules palpated in neck, no dysphagia, no odynophagia, no hoarseness Cardiovascular: no CP/+ improved SOB/no palpitations/no leg swelling Respiratory: no cough/+ improved SOB/+ improved wheezing Gastrointestinal: no N/no V/no D/no C/no acid reflux Musculoskeletal: no muscle aches/no joint aches Skin: no rashes, no hair loss Neurological: no tremors/no numbness/no tingling/no dizziness  I reviewed pt's medications, allergies, PMH, social hx, family hx, and changes were documented in the history of present illness. Otherwise, unchanged from my initial visit note.  Past Medical History:  Diagnosis Date  . Cancer ([place]) dx'd 09/2013   Lung ca  . Concussion 2018  . Diabetes ([place]) 09/08/2016   type I diabetic patient reported on 09/13/16   . Diabetes mellitus    insulin  . Diabetic neuropathy ([place]) 09/08/2016  . Fall 2018  . GERD (gastroesophageal reflux disease)    no meds for  . History of colitis   . History of hiatal hernia   . Hx of radiation therapy 12/16/13-01/30/14   lung 66Gy  . Hypertension   . Malignant neoplasm of right upper lobe of lung ([place]) 11/28/2013  . Neuropathy   . Psoriasis   . Psoriatic arthritis ([place])  09/08/2016  . Rheumatoid arthritis ([place])   . Shortness of breath dyspnea    with exertion   Past Surgical History:  Procedure Laterality Date  . ABDOMINAL HYSTERECTOMY    . ABDOMINAL SURGERY    . BALLOON DILATION N/A 12/12/2014   Procedure: BALLOON DILATION;  Surgeon: Inda Castle, MD;  Location: [facility];  Service: Endoscopy;  Laterality: N/A;  . BIOPSY  01/20/2020   Procedure: BIOPSY;  Surgeon: Doran Stabler, MD;  Location: [facility];  Service: Gastroenterology;;  . CHOLECYSTECTOMY    . COLONOSCOPY N/A 02/07/2016   Procedure: COLONOSCOPY;  Surgeon: Doran Stabler, MD;  Location[facility] END[facility]COPY;  Service: Endoscopy;  Laterality: N/A;  . DILATION AND CURETTAGE OF UTERUS    .  ESOPHAGOGASTRODUODENOSCOPY N/A 12/12/2014   Procedure: ESOPHAGOGASTRODUODENOSCOPY (EGD);  Surgeon: Inda Castle, MD;  Location: [facility];  Service: Endoscopy;  Laterality: N/A;  with dilation  . ESOPHAGOGASTRODUODENOSCOPY (EGD) WITH PROPOFOL N/A 10/09/2014   Procedure: ESOPHAGOGASTRODUODENOSCOPY (EGD) WITH PROPOFOL;  Surgeon: Inda Castle, MD;  Location: [facility];  Service: Endoscopy;  Laterality: N/A;  . ESOPHAGOGASTRODUODENOSCOPY (EGD) WITH PROPOFOL N/A 09/19/2016   Procedure: ESOPHAGOGASTRODUODENOSCOPY (EGD) WITH PROPOFOL;  Surgeon: Doran Stabler, MD;  Location: [facility];  Service: Gastroenterology;  Laterality: N/A;  with savary dil tt  . ESOPHAGOGASTRODUODENOSCOPY (EGD) WITH PROPOFOL N/A 01/20/2020   Procedure: ESOPHAGOGASTRODUODENOSCOPY (EGD) WITH PROPOFOL;  Surgeon: Doran Stabler, MD;  Location: [facility];  Service: Gastroenterology;  Laterality: N/A;  Fluoro-Savary  . ORIF ANKLE FRACTURE Right 07/04/2015   Procedure: OPEN REDUCTION INTERNAL FIXATION (ORIF) ANKLE FRACTURE;  Surgeon: Newt Minion, MD;  Location: [facility];  Service: Orthopedics;  Laterality: Right;  OPEN REDUCTION, INTERNAL FIXATION OF RIGHT ANKLE FRACTURE.   [name] ORIF ANKLE FRACTURE Left 02/10/2016   Procedure:  OPEN REDUCTION INTERNAL FIXATION (ORIF) ANKLE FRACTURE;  Surgeon: Newt Minion, MD;  Location: [facility];  Service: Orthopedics;  Laterality: Left;  . SAVORY DILATION N/A 10/09/2014   Procedure: SAVORY DILATION;  Surgeon: Inda Castle, MD;  Location: [facility];  Service: Endoscopy;  Laterality: N/A;  . SAVORY DILATION N/A 09/19/2016   Procedure: SAVORY DILATION;  Surgeon: Doran Stabler, MD;  Location: [facility];  Service: Gastroenterology;  Laterality: N/A;  . SAVORY DILATION N/A 01/20/2020   Procedure: SAVORY DILATION;  Surgeon: Doran Stabler, MD;  Location: [facility];  Service: Gastroenterology;  Laterality: N/A;  . TONSILLECTOMY     Social History   Social History  . Marital Status: Divorced    Spouse Name: N/A  . Number of Children: 0   Occupational History  . Retired Pharmacist, hospital Other   Social History Main Topics  . Smoking status: Former Smoker -- 3.00 packs/day for 45 years    Types: Cigarettes    Quit date: 12/05/1997  . Smokeless tobacco: Never Used  . Alcohol Use: No  . Drug Use: No   Social History Narrative   Patient lives at home alone.   Caffeine Use: 2 cups daily   Current Outpatient Medications on File Prior to Visit  Medication Sig Dispense Refill  . albuterol (PROVENTIL HFA;VENTOLIN HFA) 108 (90 Base) MCG/ACT inhaler Inhale 2 puffs into the lungs every 6 (six) hours as needed for wheezing or shortness of breath. 1 Inhaler 2  . amLODipine (NORVASC) 10 MG tablet TAKE 1 TABLET(10 MG) BY MOUTH DAILY 90 tablet 1  . augmented betamethasone dipropionate (DIPROLENE-AF) 0.05 % ointment     . betamethasone dipropionate (DIPROLENE) 0.05 % cream Apply topically 2 (two) times daily. (Patient taking differently: Apply 1 application topically 2 (two) times daily. ) 45 g 0  . budesonide-formoterol (SYMBICORT) 80-4.5 MCG/ACT inhaler Inhale 2 puffs into the lungs 2 (two) times daily. (Patient taking differently: Inhale 2 puffs into the lungs daily as needed  (Wheezing). ) 1 Inhaler 6  . Continuous Blood Gluc Sensor (DEXCOM G6 SENSOR) MISC Apply new sensor every 10 days for continuous glucose monitoring. 3 each 3  . Continuous Blood Gluc Transmit (DEXCOM G6 TRANSMITTER) MISC Use one transmitter every 3 months for continuous glucose monitoring. 1 each 6  . DULoxetine (CYMBALTA) 60 MG capsule Take 1 capsule (60 mg total) by mouth 2 (two) times daily. 68 capsule 0  .  famotidine (PEPCID) 40 MG tablet Take 40 mg by mouth daily as needed for heartburn.     . Fluocinolone Acetonide Scalp 0.01 % OIL     . fluocinonide (LIDEX) 0.05 % external solution     . gabapentin (NEURONTIN) 300 MG capsule TAKE 1 CAPSULE(300 MG) BY MOUTH THREE TIMES DAILY (Patient taking differently: Take 300 mg by mouth 3 (three) times daily. ) 270 capsule 4  . GVOKE HYPOPEN 1-PACK 1 MG/0.2ML SOAJ Inject under skin 0.2 ml as needed for hypoglycemia 0.2 mL prn  . insulin aspart (NOVOLOG FLEXPEN) 100 UNIT/ML FlexPen INJECT 7 UNITS UNDER THE SKIN THREE TIMES DAILY 15 mL 3  . insulin degludec (TRESIBA FLEXTOUCH) 100 UNIT/ML FlexTouch Pen Inject 0.27 mLs (27 Units total) into the skin daily. 5 pen 3  . Insulin Pen Needle (B-D UF III MINI PEN NEEDLES) 31G X 5 MM MISC Use with insulin pen 4 times a day to inject insulin 360 each 11  . ketoconazole (NIZORAL) 2 % cream     . metoprolol tartrate (LOPRESSOR) 50 MG tablet  (Patient not taking: Reported on 07/28/2020)    . OTEZLA 30 MG TABS     . pravastatin (PRAVACHOL) 40 MG tablet Take 1 tablet (40 mg total) by mouth every evening. 90 tablet 3  . solifenacin (VESICARE) 10 MG tablet Take 10 mg by mouth daily.     No current facility-administered medications on file prior to visit.   Allergies  Allergen Reactions  . Carboplatin Anaphylaxis, Shortness Of Breath and Other (See Comments)    Neuropathy  . Remicade [Infliximab] Anaphylaxis  . Rifampin Other (See Comments)    unknown  . Hydromorphone Rash   Family History  Problem Relation Age of  Onset  . Other Mother   . Anuerysm Mother        brain  . Other Father        failure to thrive  . Hypertension Other   . Stroke Other   . Heart attack Other   . Hemachromatosis Other        great grandfather  . Rheum arthritis Other        both sets of grandparents and father  . Migraines Neg Hx   . Colon cancer Neg Hx   . Colon polyps Neg Hx   . Esophageal cancer Neg Hx   . Pancreatic cancer Neg Hx   . Stomach cancer Neg Hx   . Liver disease Neg Hx    PE: BP 102/60   Pulse (!) 102   Ht 5\' 6"  (1.676 m)   Wt 158 lb (71.7 kg)   SpO2 96%   BMI 25.50 kg/m  Wt Readings from Last 3 Encounters:  08/04/20 158 lb (71.7 kg)  07/28/20 160 lb (72.6 kg)  06/30/20 158 lb 9.6 oz (71.9 kg)   Constitutional: normal weight, in NAD Eyes: PERRLA, EOMI, no exophthalmos ENT: moist mucous membranes, no thyromegaly, no cervical lymphadenopathy Cardiovascular: tachycardia, RR, No MRG, + B pitting LE edema Respiratory: CTA B Gastrointestinal: abdomen soft, NT, ND, BS+ Musculoskeletal: no deformities, strength intact in all 4 Skin: moist, warm, no rashes Neurological: no tremor with outstretched hands, DTR normal in all 4  ASSESSMENT: 1. LADA, insulin-dependent, uncontrolled, with complications - DKA in the setting of dehydration (- PN) - this may be 2/2 ChTx.  Pulmonologist: Dr. Chase Caller Oncologist: Dr. Earlie Server Cardiologist: Dr. Sallyanne Kuster Ophthalmologist: Dr. Clifton James  2.  Peripheral neuropathy -Possibly from Diabetes  PLAN:  1. Patient with longstanding,  poorly controlled diabetes diagnosed as LADA, with very fluctuating CBGs, on a basal-bolus insulin regimen.  At last visit, she returned after long absence of more than a year, after being admitted to the hospital with a blood sugar higher than 1000.  At that time, she was off her Dexcom CGM and she felt that this would have helped prevent the episodes.  We send a preauthorization for her to restart the CGM ASAP.  At last visit  her friend asked about an insulin pump, but I do not feel the patient would be able to manage it and she agreed.  They also asked about other options for treatment but states she has LADA (a variant of type 1 diabetes), we do not have any other treatment options. -At this visit, she returns after 4 months, not after 1 month as advised her at last visit. -At last visit we reviewed together her insulin regimen and reviewed her discharge papers from her hospitalization.  We increased the dose of basal insulin and also changed to [place] which had a longer duration of action and induced less CBG fluctuations.  Also, she was using fixed dose of Humalog before meals and we discussed that this was not usually conducive to good control.  I advised her to use different doses depending on her meals and her sugars before meals. -Since then, she continued to have fluctuations in the blood sugars and she contacted my colleague when I was out of the office this summer.  Tresiba dose was increased, Humalog dose was decreased. CGM interpretation: -At today's visit, we reviewed together her CGM traces for the last 2 weeks.  There is a clear pattern of sugars decreasing overnight from 250-350 range to 130-220 range around 12 PM.  After lunch, sugars increase to 220-370 after dinner.  There are no lows.  Only 21% of her blood sugars are in target range, and 78.7% of the blood sugars are about 180. -Upon questioning, she is still taking a fixed dose of NovoLog, 7 units before each meal.  We discussed that this is not enough based on the CGM tracings, since her sugars increase throughout the day in a stepwise fashion.  Also, I feel that the Tresiba dose may not be enough since all her sugars are high.  We will increase both [place] and NovoLog doses.  I also explained how to use NovoLog depending on the size of her meals.  She asks whether she should take NovoLog before a snack and I advised her to it would be ideal if she could  take a lower dose if she absolutely has to have a snack. - I suggested to:  Patient Instructions  Please increase: - Tresiba 30 units daily - NovoLog mealtime dose                         - small meal: 8 units             - larger meal: 10-12 units              Please do not skip the insulin if sugars are 70-100.              If the sugars are 60-70, take 1/2 of the Humalog dose.             If the sugars are <60, skip insulin with that meal             No Humalog  at bedtime.  You may need 3 units of Novolog before a snack.              Please stop at the lab.  Please return in 1.5 month.   - we checked her HbA1c: 9.3% (slightly lower) - advised to check sugars at different times of the day - 4x a day, rotating check times - advised for yearly eye exams >> she is UTD - return to clinic in 1.5 month  2. PN  -Stable, without pain, only with numbness -She was previously on Lyrica but came off afterwards.  3.  Hyperlipidemia - Reviewed latest lipid panel from 05/2019: LDL 156, much above target. - Continues pravastatin 40 without side effects. -We will recheck lipids today and send the results to her cardiologist.  Component     Latest Ref Rng & Units 08/04/2020  Cholesterol     0 - 200 mg/dL 164  Triglycerides     0 - 149 mg/dL 86.0  HDL Cholesterol     >39.00 mg/dL 49.50  VLDL     0.0 - 40.0 mg/dL 17.2  LDL (calc)     0 - 99 mg/dL 97  Total CHOL/HDL Ratio      3  NonHDL      114.51  Will forward the results to Dr. Sallyanne Kuster.  Philemon Kingdom, MD PhD [facility] Endocrinology

## 2020-08-04 NOTE — Patient Instructions (Addendum)
Please increase: - Tresiba 30 units daily - NovoLog mealtime dose                         - small meal: 8 units             - larger meal: 10-12 units              Please do not skip the insulin if sugars are 70-100.              If the sugars are 60-70, take 1/2 of the Humalog dose.             If the sugars are <60, skip insulin with that meal             No Humalog at bedtime.  You may need 3 units of Novolog before a snack.              Please stop at the lab.  Please return in 1.5 month.

## 2020-08-05 ENCOUNTER — Ambulatory Visit (HOSPITAL_COMMUNITY)
Admission: RE | Admit: 2020-08-05 | Discharge: 2020-08-05 | Disposition: A | Discharge status: Home / Self Care | Payer: Medicare PPO | Source: Ambulatory Visit | Attending: Internal Medicine | Admitting: Internal Medicine

## 2020-08-05 ENCOUNTER — Other Ambulatory Visit: Payer: Self-pay

## 2020-08-05 ENCOUNTER — Telehealth: Payer: Self-pay | Admitting: Internal Medicine

## 2020-08-05 ENCOUNTER — Inpatient Hospital Stay: Payer: Medicare PPO | Attending: Internal Medicine

## 2020-08-05 DIAGNOSIS — C3490 Malignant neoplasm of unspecified part of unspecified bronchus or lung: Secondary | ICD-10-CM | POA: Insufficient documentation

## 2020-08-05 DIAGNOSIS — I700 Atherosclerosis of aorta: Secondary | ICD-10-CM | POA: Diagnosis not present

## 2020-08-05 LAB — CBC WITH DIFFERENTIAL (CANCER CENTER ONLY)
Abs Immature Granulocytes: 0.09 10*3/uL — ABNORMAL HIGH (ref 0.00–0.07)
Basophils Absolute: 0.1 10*3/uL (ref 0.0–0.1)
Basophils Relative: 1 %
Eosinophils Absolute: 0.3 10*3/uL (ref 0.0–0.5)
Eosinophils Relative: 3 %
HCT: 43.2 % (ref 36.0–46.0)
Hemoglobin: 14 g/dL (ref 12.0–15.0)
Immature Granulocytes: 1 %
Lymphocytes Relative: 21 %
Lymphs Abs: 1.9 10*3/uL (ref 0.7–4.0)
MCH: 28.5 pg (ref 26.0–34.0)
MCHC: 32.4 g/dL (ref 30.0–36.0)
MCV: 87.8 fL (ref 80.0–100.0)
Monocytes Absolute: 0.5 10*3/uL (ref 0.1–1.0)
Monocytes Relative: 6 %
Neutro Abs: 6.2 10*3/uL (ref 1.7–7.7)
Neutrophils Relative %: 68 %
Platelet Count: 221 10*3/uL (ref 150–400)
RBC: 4.92 MIL/uL (ref 3.87–5.11)
RDW: 14.4 % (ref 11.5–15.5)
WBC Count: 9 10*3/uL (ref 4.0–10.5)
nRBC: 0 % (ref 0.0–0.2)

## 2020-08-05 LAB — CMP (CANCER CENTER ONLY)
ALT: 17 U/L (ref 0–44)
AST: 17 U/L (ref 15–41)
Albumin: 3.7 g/dL (ref 3.5–5.0)
Alkaline Phosphatase: 126 U/L (ref 38–126)
Anion gap: 5 (ref 5–15)
BUN: 17 mg/dL (ref 8–23)
CO2: 32 mmol/L (ref 22–32)
Calcium: 9.7 mg/dL (ref 8.9–10.3)
Chloride: 102 mmol/L (ref 98–111)
Creatinine: 1.09 mg/dL — ABNORMAL HIGH (ref 0.44–1.00)
GFR, Estimated: 49 mL/min — ABNORMAL LOW (ref >60)
Glucose, Bld: 135 mg/dL — ABNORMAL HIGH (ref 70–99)
Potassium: 3.7 mmol/L (ref 3.5–5.1)
Sodium: 139 mmol/L (ref 135–145)
Total Bilirubin: 0.6 mg/dL (ref 0.3–1.2)
Total Protein: 7 g/dL (ref 6.5–8.1)

## 2020-08-05 MED ORDER — IOHEXOL 300 MG/ML  SOLN
75.0000 mL | Freq: Once | INTRAMUSCULAR | Status: AC | PRN
  Administered 2020-08-05: 75 mL via INTRAVENOUS

## 2020-08-05 NOTE — Telephone Encounter (Signed)
Patient called stating she has no more sensors and yesterday she thought we were going to send the documentation over to [facility], but she called [facility] and they do not have anything. Patient ph# [phone]

## 2020-08-05 NOTE — Progress Notes (Signed)
Merci!

## 2020-08-05 NOTE — Telephone Encounter (Signed)
F/u  Checking on the status of previous messages from today .

## 2020-08-06 ENCOUNTER — Ambulatory Visit: Payer: Medicare PPO | Admitting: Internal Medicine

## 2020-08-06 ENCOUNTER — Telehealth: Payer: Self-pay | Admitting: Physician Assistant

## 2020-08-06 ENCOUNTER — Other Ambulatory Visit: Payer: Self-pay

## 2020-08-06 ENCOUNTER — Inpatient Hospital Stay: Payer: Medicare PPO | Admitting: Physician Assistant

## 2020-08-06 ENCOUNTER — Other Ambulatory Visit: Payer: Self-pay | Admitting: Internal Medicine

## 2020-08-06 MED ORDER — DEXCOM G6 SENSOR MISC: 3 refills | Status: DC

## 2020-08-06 MED ORDER — DEXCOM G6 SENSOR MISC
3 refills | Status: DC
Start: 2020-08-06 — End: 2020-08-06

## 2020-08-06 MED ORDER — DEXCOM G6 TRANSMITTER MISC: 3 refills | Status: DC

## 2020-08-06 MED ORDER — DEXCOM G6 TRANSMITTER MISC
3 refills | Status: DC
Start: 2020-08-06 — End: 2020-08-06

## 2020-08-06 MED ORDER — DEXCOM G6 SENSOR MISC: 3 refills | Status: AC

## 2020-08-06 NOTE — Telephone Encounter (Signed)
I called the patient and left a voicemail about her no show appointment today. Encouraged her to call back to reschedule. Also discussed that if transportation is an issue, she can request a virtual or telephone visit. I sent a scheduling message as well.

## 2020-08-06 NOTE — Telephone Encounter (Signed)
Dexcom rx sent along with chart notes.

## 2020-08-07 ENCOUNTER — Telehealth: Payer: Self-pay | Admitting: Physician Assistant

## 2020-08-07 ENCOUNTER — Ambulatory Visit: Payer: Medicare PPO | Admitting: Physician Assistant

## 2020-08-07 NOTE — Telephone Encounter (Signed)
I called and confirmed the appointment time with the patient. She expressed understanding.

## 2020-08-07 NOTE — Telephone Encounter (Signed)
error 

## 2020-08-08 NOTE — Telephone Encounter (Signed)
Patient came into the office and received Dexcom samples.  Office notes sent to [facility] as well.

## 2020-08-10 ENCOUNTER — Ambulatory Visit: Payer: Medicare PPO | Admitting: Internal Medicine

## 2020-08-10 DIAGNOSIS — E1149 Type 2 diabetes mellitus with other diabetic neurological complication: Secondary | ICD-10-CM | POA: Diagnosis not present

## 2020-08-10 NOTE — Progress Notes (Signed)
[facility] HEMATOLOGY-ONCOLOGY TeleHEALTH VISIT PROGRESS NOTE   I connected with Megan [name] on 08/12/20 at  2:00 PM EDT by telephone and verified that I am speaking with the correct person using two identifiers.  I discussed the limitations, risks, security and privacy concerns of performing an evaluation and management service by telemedicine and the availability of in-person appointments. I also discussed with the patient that there may be a patient responsible charge related to this service. The patient expressed understanding and agreed to proceed.  Other persons participating in the visit and their role in the encounter: Dr. Julien Nordmann  Patient's location: Home Provider's location: Office  Burns, [name], MD [place] [address]  DIAGNOSIS:  Squamous cell lung cancer   Primary site: Lung (Right)   Staging method: AJCC 7th Edition   Clinical: Stage IIIB (T3, N3, M0)   Summary: Stage IIIB (T3, N3, M0)    PRIOR THERAPY:  1) Concurrent chemoradiation with weekly chemotherapy in the form of carboplatin for AUC of 2 and paclitaxel 45 mg/m2. Status post 7 week of treatment. Last dose was given 01/27/2014 no significant response in her disease.   2) Consolidation chemotherapy with carboplatin for AUC of 5 and paclitaxel 175 mg/M2 every 3 weeks with Neulasta support. First dose on 04/15/2014. Status post 3 cycles. Carboplatin was discontinued starting cycle #2 secondary to hypersensitivity reaction. 3) Immunotherapy with Opdivo (Nivolumab) 3mg /kg given every 2 weeks. Status post 33 cycles discontinued secondary to intolerance.  CURRENT THERAPY: Observation  INTERVAL HISTORY: Dr. Julien Nordmann and I connected with the patient,  Megan [name] 79 y.o. female, via a telephone visit today. The patient is feeling well today without any concerning complaints except in the interval since her last appointment she broke her toe. The patient has been on  observation since 2015. Her last follow up appointment was 6 months ago. Denies any fever, chills, night sweats, or weight loss. Denies any chest pain, shortness of breath, cough, or hemoptysis. She actually states her breathing has been getting better. Denies any nausea, vomiting, diarrhea, or constipation. Denies any headache or visual changes. The patient recently had a restaging CT scan performed. The visit today is for evaluation and to review her scan results.   MEDICAL HISTORY: Past Medical History:  Diagnosis Date  . Cancer ([place]) dx'd 09/2013   Lung ca  . Concussion 2018  . Diabetes ([place]) 09/08/2016   type I diabetic patient reported on 09/13/16   . Diabetes mellitus    insulin  . Diabetic neuropathy ([place]) 09/08/2016  . Fall 2018  . GERD (gastroesophageal reflux disease)    no meds for  . History of colitis   . History of hiatal hernia   . Hx of radiation therapy 12/16/13-01/30/14   lung 66Gy  . Hypertension   . Malignant neoplasm of right upper lobe of lung ([place]) 11/28/2013  . Neuropathy   . Psoriasis   . Psoriatic arthritis ([place]) 09/08/2016  . Rheumatoid arthritis ([name]    with exertion    ALLERGIES:  is allergic to carboplatin, remicade [infliximab], rifampin, and hydromorphone.  MEDICATIONS:  Current Outpatient Medications  Medication Sig Dispense Refill  . albuterol (PROVENTIL HFA;VENTOLIN HFA) 108 (90 Base) MCG/ACT inhaler Inhale 2 puffs into the lungs every 6 (six) hours as needed for wheezing or shortness of breath. 1 Inhaler 2  . amLODipine (NORVASC) 10 MG tablet TAKE 1 TABLET(10 MG) BY MOUTH DAILY 90  tablet 1  . augmented betamethasone dipropionate (DIPROLENE-AF) 0.05 % ointment     . betamethasone dipropionate (DIPROLENE) 0.05 % cream Apply topically 2 (two) times daily. (Patient taking differently: Apply 1 application topically 2 (two) times daily. ) 45 g 0  . budesonide-formoterol (SYMBICORT) 80-4.5 MCG/ACT inhaler Inhale 2 puffs  into the lungs 2 (two) times daily. (Patient taking differently: Inhale 2 puffs into the lungs daily as needed (Wheezing). ) 1 Inhaler 6  . Continuous Blood Gluc Sensor (DEXCOM G6 SENSOR) MISC Apply new sensor every 10 days for continuous glucose monitoring. 9 each 3  . Continuous Blood Gluc Transmit (DEXCOM G6 TRANSMITTER) MISC Use one transmitter every 3 months for continuous glucose monitoring. 1 each 3  . DULoxetine (CYMBALTA) 60 MG capsule Take 1 capsule (60 mg total) by mouth 2 (two) times daily. 68 capsule 0  . famotidine (PEPCID) 40 MG tablet Take 40 mg by mouth daily as needed for heartburn.     . Fluocinolone Acetonide Scalp 0.01 % OIL     . fluocinonide (LIDEX) 0.05 % external solution     . gabapentin (NEURONTIN) 300 MG capsule TAKE 1 CAPSULE(300 MG) BY MOUTH THREE TIMES DAILY (Patient taking differently: Take 300 mg by mouth 3 (three) times daily. ) 270 capsule 4  . GVOKE HYPOPEN 1-PACK 1 MG/0.2ML SOAJ Inject under skin 0.2 ml as needed for hypoglycemia 0.2 mL prn  . insulin aspart (NOVOLOG FLEXPEN) 100 UNIT/ML FlexPen INJECT 8-12 UNITS UNDER THE SKIN THREE TIMES DAILY 45 mL 3  . insulin degludec (TRESIBA FLEXTOUCH) 100 UNIT/ML FlexTouch Pen Inject 30 Units into the skin daily. 9 mL 3  . Insulin Pen Needle (B-D UF III MINI PEN NEEDLES) 31G X 5 MM MISC Use with insulin pen 4 times a day to inject insulin 360 each 11  . ketoconazole (NIZORAL) 2 % cream     . metoprolol tartrate (LOPRESSOR) 50 MG tablet     . OTEZLA 30 MG TABS     . pravastatin (PRAVACHOL) 40 MG tablet Take 1 tablet (40 mg total) by mouth every evening. 90 tablet 3  . solifenacin (VESICARE) 10 MG tablet Take 10 mg by mouth daily.     No current facility-administered medications for this visit.    SURGICAL HISTORY:  Past Surgical History:  Procedure Laterality Date  . ABDOMINAL HYSTERECTOMY    . ABDOMINAL SURGERY    . BALLOON DILATION N/A 12/12/2014   Procedure: BALLOON DILATION;  Surgeon: Inda Castle, MD;   Location: [facility];  Service: Endoscopy;  Laterality: N/A;  . BIOPSY  01/20/2020   Procedure: BIOPSY;  Surgeon: Doran Stabler, MD;  Location: [facility];  Service: Gastroenterology;;  . CHOLECYSTECTOMY    . COLONOSCOPY N/A 02/07/2016   Procedure: COLONOSCOPY;  Surgeon: Doran Stabler, MD;  Location: [facility];  Service: Endoscopy;  Laterality: N/A;  . DILATION AND CURETTAGE OF UTERUS    . ESOPHAGOGASTRODUODENOSCOPY N/A 12/12/2014   Procedure: ESOPHAGOGASTRODUODENOSCOPY (EGD);  Surgeon: Inda Castle, MD;  Location: [facility];  Service: Endoscopy;  Laterality: N/A;  with dilation  . ESOPHAGOGASTRODUODENOSCOPY (EGD) WITH PROPOFOL N/A 10/09/2014   Procedure: ESOPHAGOGASTRODUODENOSCOPY (EGD) WITH PROPOFOL;  Surgeon: Inda Castle, MD;  Location: [facility];  Service: Endoscopy;  Laterality: N/A;  . ESOPHAGOGASTRODUODENOSCOPY (EGD) WITH PROPOFOL N/A 09/19/2016   Procedure: ESOPHAGOGASTRODUODENOSCOPY (EGD) WITH PROPOFOL;  Surgeon: Doran Stabler, MD;  Location: [facility];  Service: Gastroenterology;  Laterality: N/A;  with savary dil tt  .  ESOPHAGOGASTRODUODENOSCOPY (EGD) WITH PROPOFOL N/A 01/20/2020   Procedure: ESOPHAGOGASTRODUODENOSCOPY (EGD) WITH PROPOFOL;  Surgeon: Doran Stabler, MD;  Location: [facility];  Service: Gastroenterology;  Laterality: N/A;  Fluoro-Savary  . ORIF ANKLE FRACTURE Right 07/04/2015   Procedure: OPEN REDUCTION INTERNAL FIXATION (ORIF) ANKLE FRACTURE;  Surgeon: Newt Minion, MD;  Location: [facility];  Service: Orthopedics;  Laterality: Right;  OPEN REDUCTION, INTERNAL FIXATION OF RIGHT ANKLE FRACTURE.   [name] ORIF ANKLE FRACTURE Left 02/10/2016   Procedure: OPEN REDUCTION INTERNAL FIXATION (ORIF) ANKLE FRACTURE;  Surgeon: Newt Minion, MD;  Location: [facility];  Service: Orthopedics;  Laterality: Left;  . SAVORY DILATION N/A 10/09/2014   Procedure: SAVORY DILATION;  Surgeon: Inda Castle, MD;  Location: [facility];  Service: Endoscopy;  Laterality:  N/A;  . SAVORY DILATION N/A 09/19/2016   Procedure: SAVORY DILATION;  Surgeon: Doran Stabler, MD;  Location: [facility];  Service: Gastroenterology;  Laterality: N/A;  . SAVORY DILATION N/A 01/20/2020   Procedure: SAVORY DILATION;  Surgeon: Doran Stabler, MD;  Location: [facility];  Service: Gastroenterology;  Laterality: N/A;  . TONSILLECTOMY      REVIEW OF SYSTEMS:   Review of Systems  Constitutional: Negative for appetite change, chills, fatigue, fever and unexpected weight change.  HENT:   Negative for mouth sores, nosebleeds, sore throat and trouble swallowing.   Eyes: Negative for eye problems and icterus.  Respiratory: Negative for cough, hemoptysis, shortness of breath and wheezing.   Cardiovascular: Negative for chest pain and leg swelling.  Gastrointestinal: Negative for abdominal pain, constipation, diarrhea, nausea and vomiting.  Genitourinary: Negative for bladder incontinence, difficulty urinating, dysuria, frequency and hematuria.   Musculoskeletal: Negative for back pain, gait problem, neck pain and neck stiffness.  Skin: Negative for itching and rash.  Neurological: Negative for dizziness, extremity weakness, gait problem, headaches, light-headedness and seizures.  Hematological: Negative for adenopathy. Does not bruise/bleed easily.  Psychiatric/Behavioral: Negative for confusion, depression and sleep disturbance. The patient is not nervous/anxious.     PHYSICAL EXAMINATION:  There were no vitals taken for this visit.  Physical exam: Not obtained due to telephone visit.   LABORATORY DATA: Lab Results  Component Value Date   WBC 9.0 08/05/2020   HGB 14.0 08/05/2020   HCT 43.2 08/05/2020   MCV 87.8 08/05/2020   PLT 221 08/05/2020      Chemistry      Component Value Date/Time   NA 139 08/05/2020 1326   NA 139 07/10/2019 0922   NA 141 09/08/2017 1213   K 3.7 08/05/2020 1326   K 4.1 09/08/2017 1213   CL 102 08/05/2020 1326   CO2 32 08/05/2020  1326   CO2 28 09/08/2017 1213   BUN 17 08/05/2020 1326   BUN 18 07/10/2019 0922   BUN 13.8 09/08/2017 1213   CREATININE 1.09 (H) 08/05/2020 1326   CREATININE 0.9 09/08/2017 1213   GLU 176 03/07/2016 0000      Component Value Date/Time   CALCIUM 9.7 08/05/2020 1326   CALCIUM 9.4 09/08/2017 1213   ALKPHOS 126 08/05/2020 1326   ALKPHOS 182 (H) 09/08/2017 1213   AST 17 08/05/2020 1326   AST 15 09/08/2017 1213   ALT 17 08/05/2020 1326   ALT 17 09/08/2017 1213   BILITOT 0.6 08/05/2020 1326   BILITOT 0.45 09/08/2017 1213       RADIOGRAPHIC STUDIES:  CT Chest W Contrast  Result Date: 08/05/2020 CLINICAL DATA:  Lung cancer. EXAM: CT CHEST WITH CONTRAST  TECHNIQUE: Multidetector CT imaging of the chest was performed during intravenous contrast administration. CONTRAST:  102mL OMNIPAQUE IOHEXOL 300 MG/ML  SOLN COMPARISON:  02/03/2020 and 05/17/2017. FINDINGS: Cardiovascular: Atherosclerotic calcification of the aorta and coronary arteries. Heart size normal. Small amount of pericardial fluid appears similar and is likely physiologic. Mediastinum/Nodes: No pathologically enlarged mediastinal, hilar or axillary lymph nodes. Calcified left hilar lymph nodes. Proximal esophagus is dilated. Mid esophagus remains thickened, as before. Lungs/Pleura: Chronic right upper and right middle lobe collapse. Small area of rounded consolidation and surrounding architectural distortion in the right lower lobe, also chronically stable. Calcified granuloma in the left lower lobe. Mild volume loss in the medial left lower lobe. 4 mm subpleural apical left upper lobe nodule (7/30), unchanged. No pleural fluid. Airway is otherwise unremarkable. Upper Abdomen: Low-attenuation lesions in the liver measure up to 1.7 cm, as before. Cholecystectomy. Adrenal glands and visualized portions of the kidneys, spleen, pancreas, stomach and bowel are unremarkable. No upper abdominal adenopathy. Musculoskeletal: Degenerative changes in  the spine. No worrisome lytic or sclerotic lesions. IMPRESSION: 1. Chronic right upper/right middle lobe collapse and volume loss/architectural distortion in the right lower lobe, findings indicative of prior radiation therapy. No evidence of recurrent or metastatic disease. 2. Mid esophageal wall thickening and probable associated narrowing, given upper esophageal dilatation. Findings are similar to prior and may be related to radiation therapy. Difficult to exclude a mass. Patient underwent upper endoscopy 01/20/2020. 3. Aortic atherosclerosis (ICD10-I70.0). Coronary artery calcification. Electronically Signed   By: Lorin Picket M.D.   On: 08/05/2020 15:11   DG Foot Complete Right  Result Date: 07/29/2020 CLINICAL DATA:  Right foot pain after injury 2 weeks ago. EXAM: RIGHT FOOT COMPLETE - 3+ VIEW COMPARISON:  January 05, 2017. FINDINGS: Status post surgical fusion of the first tarsometatarsal joint. Mildly displaced fracture is seen involving the distal portion of the fourth proximal phalanx. No soft tissue abnormality is noted. IMPRESSION: Mildly displaced fourth proximal phalangeal fracture. Electronically Signed   By: Marijo Conception M.D.   On: 07/29/2020 15:07     ASSESSMENT/PLAN:  This is a very pleasant 79 year old Caucasian female with a history of recurrent non-small cell lung cancer, squamous cell carcinoma initially diagnosed as a stage IIIb.  The patient is status post a course of concurrent chemoradiation as well as consolidation chemotherapy.  This was discontinued secondary to intolerance and significant peripheral neuropathy.  The patient was then treated with 33 cycles of immunotherapy with nivolumab and tolerated this well but it was also discontinued secondary to immunotherapy mediated colitis and diarrhea.  The patient has been on observation since that time.  The patient recently had a restaging CT scan performed.  Dr. Julien Nordmann personally and independently reviewed the scan and  discussed results with patient today.  The scan did not show any evidence of disease progression.   Dr. Julien Nordmann recommends that the patient continue on observation with a repeat CT scan of the chest in 6 months.   We will see the patient back for follow-up visit visit in 6 months for evaluation and to review her scan results.  I discussed the assessment and treatment plan with the patient. The patient was provided an opportunity to ask questions and all were answered. The patient agreed with the plan and demonstrated an understanding of the instructions.  The patient was advised to call back or seek an in-person evaluation if the symptoms worsen or if the condition fails to improve as anticipated.  I provided 12  minutes of non face-to-face telephone visit time during this encounter, and > 50% was spent counseling as documented under my assessment & plan.  Vonne Mcdanel L Joycelynn Fritsche, PA-C 08/12/2020 2:07 PM  Orders Placed This Encounter  Procedures  . CT Chest W Contrast    Standing Status:   Future    Standing Expiration Date:   08/12/2021    Order Specific Question:   If indicated for the ordered procedure, I authorize the administration of contrast media per Radiology protocol    Answer:   Yes    Order Specific Question:   Preferred imaging location?    Answer:   [facility]  . CBC with Differential ([facility] Only)    Standing Status:   Future    Standing Expiration Date:   08/12/2021  . CMP ([facility] only)    Standing Status:   Future    Standing Expiration Date:   08/12/2021     Tobe Sos Devontaye Ground, PA-C 08/12/20  ADDENDUM: Hematology/Oncology Attending: I had a telephone visit with the patient today for evaluation and discussion of her scan results.  This is a very pleasant 78 years old white female with history of stage IIIb non-small cell lung cancer status post a course of concurrent chemoradiation followed by consolidation chemotherapy.  The patient  was also treated with second line treatment with nivolumab status post 33 cycles discontinued secondary to colitis. The patient has been in observation now for several years and she is feeling fine. She had repeat CT scan of the chest performed recently.  I personally and independently reviewed the scans and discussed the results with the patient today. Her scan showed no concerning findings for disease progression. I recommended for the patient to continue on observation with repeat CT scan of the chest in 6 months. The patient was advised to call immediately if she has any concerning symptoms in the interval.  Disclaimer: This note was dictated with voice recognition software. Similar sounding words can inadvertently be transcribed and may be missed upon review. Eilleen Kempf, MD 08/12/20

## 2020-08-11 ENCOUNTER — Telehealth: Payer: Self-pay | Admitting: Physician Assistant

## 2020-08-11 NOTE — Telephone Encounter (Signed)
Contacted patient to verify telephone visit for pre reg °

## 2020-08-12 ENCOUNTER — Other Ambulatory Visit: Payer: Self-pay

## 2020-08-12 ENCOUNTER — Ambulatory Visit: Payer: Medicare PPO | Admitting: Family Medicine

## 2020-08-12 ENCOUNTER — Inpatient Hospital Stay (HOSPITAL_BASED_OUTPATIENT_CLINIC_OR_DEPARTMENT_OTHER): Payer: Medicare PPO | Admitting: Physician Assistant

## 2020-08-12 ENCOUNTER — Encounter: Payer: Self-pay | Admitting: Physician Assistant

## 2020-08-12 ENCOUNTER — Encounter: Payer: Self-pay | Admitting: Family Medicine

## 2020-08-12 VITALS — BP 102/70 | HR 102 | Ht 66.0 in | Wt 160.0 lb

## 2020-08-12 DIAGNOSIS — M79671 Pain in right foot: Secondary | ICD-10-CM

## 2020-08-12 DIAGNOSIS — C3411 Malignant neoplasm of upper lobe, right bronchus or lung: Secondary | ICD-10-CM

## 2020-08-12 DIAGNOSIS — Z85118 Personal history of other malignant neoplasm of bronchus and lung: Secondary | ICD-10-CM

## 2020-08-12 NOTE — Patient Instructions (Addendum)
Thank you for coming in today.  I think that the toe looks ok.  If not better we can do a repeat xray.  Continue current treatment.  Recheck as needed.

## 2020-08-12 NOTE — Progress Notes (Signed)
   Rito Ehrlich, am serving as a Education administrator for Dr. Lynne Leader.  Megan [name] is a 79 y.o. female who presents to [place] at [facility] today for f/u of R foot pain after a table slid out of her trunk and hit her on the top of her foot.  She was last seen by Dr. Georgina Snell on 07/28/20 and was advised to use Voltaren gel and to wear comfortable shoes. Since last visit patient states that her foot is feeling better, it is still bruised but not as bad as it was at her last visit.   Diagnostic imaging: R foot XR- 07/28/20  Pertinent review of systems: No fevers or chills  Relevant historical information: Right upper lobe lung cancer status post radiation treatment.   Exam:  BP 102/70 (BP Location: Left Arm, Patient Position: Sitting, Cuff Size: Normal)   Pulse (!) 102   Ht 5\' 6"  (1.676 m)   Wt 160 lb (72.6 kg)   SpO2 96%   BMI 25.82 kg/m  General: Well Developed, well nourished, and in no acute distress.   MSK: Right foot slight swelling forefoot.  Minimal tender palpation fourth toe.  No erythema.    Lab and Radiology Results EXAM: RIGHT FOOT COMPLETE - 3+ VIEW  COMPARISON:  January 05, 2017.  FINDINGS: Status post surgical fusion of the first tarsometatarsal joint. Mildly displaced fracture is seen involving the distal portion of the fourth proximal phalanx. No soft tissue abnormality is noted.  IMPRESSION: Mildly displaced fourth proximal phalangeal fracture.   Electronically Signed   By: Marijo Conception M.D.   On: 07/29/2020 15:07  I, Lynne Leader, personally (independently) visualized and performed the interpretation of the images attached in this note.     Assessment and Plan: 79 y.o. female with foot pain due to contusion.  Patient has probable fourth toe fracture on x-ray of foot July 28, 2020.  Clinically she is doing quite well.  Her foot shape naturally ask is a bit of a buddy splint to the fourth toe.  Discussed the need to  repeat x-ray.  Clinically she is almost completely asymptomatic.  Discussed that likely work not going to do much more than observation for her toe fracture at this point.  She would like to avoid excessive radiation if possible and I think this is reasonable.  Plan for watchful waiting and advancing activity as tolerated if not improving or if worsening will repeat x-ray.     Discussed warning signs or symptoms. Please see discharge instructions. Patient expresses understanding.   The above documentation has been reviewed and is accurate and complete Lynne Leader, M.D.  Total encounter time 20 minutes including face-to-face time with the patient and, reviewing past medical record, and charting on the date of service.  Plan and options.

## 2020-08-24 ENCOUNTER — Other Ambulatory Visit: Payer: Self-pay

## 2020-08-24 ENCOUNTER — Ambulatory Visit: Payer: Medicare PPO | Admitting: Physician Assistant

## 2020-08-24 ENCOUNTER — Encounter: Payer: Self-pay | Admitting: Physician Assistant

## 2020-08-24 ENCOUNTER — Ambulatory Visit (HOSPITAL_COMMUNITY)
Admission: RE | Admit: 2020-08-24 | Discharge: 2020-08-24 | Disposition: A | Discharge status: Home / Self Care | Payer: Medicare PPO | Source: Ambulatory Visit | Attending: Physician Assistant | Admitting: Physician Assistant

## 2020-08-24 VITALS — BP 119/68 | HR 97 | Temp 98.7°F | Resp 20 | Ht 66.0 in | Wt 161.5 lb

## 2020-08-24 DIAGNOSIS — I872 Venous insufficiency (chronic) (peripheral): Secondary | ICD-10-CM

## 2020-08-24 DIAGNOSIS — R609 Edema, unspecified: Secondary | ICD-10-CM | POA: Diagnosis not present

## 2020-08-24 NOTE — Progress Notes (Signed)
Requested by:  Gregor Hams, MD [place],  [place] [address]  Reason for consultation: painful varicose vein behind right knee   History of Present Illness   Megan [name] is a 79 y.o. (Dec 24, 1940) female who presents for evaluation of painful varicose vein behind right knee. States she has has had varicose veins in her legs for many years. They do not bother her. She has very minimal swelling that is improved with elevation. She however just about 2 months ago noticed a knot behind her right knee. It has become more bothersome and sometimes is tender to touch and she gets burning and stinging in the area. She tries to avoid touching it or putting too much pressure on it. She denies any bleeding. She has tried compression stockings but physically cannot get them on and says that she does not live with anyone who can assist her. She does elevate and also takes her dog on a walk daily. She does not have any history of DVT. She does have family history of venous disease  Venous symptoms include: burning, stinging, swelling Onset/duration:  2 months Occupation: retired Aggravating factors: sitting, standing Alleviating factors: elevation Compression:  Yes, cannot tolerate Helps: no Pain medications: none Previous vein procedures:  none History of DVT:  No  Past Medical History:  Diagnosis Date   Cancer ([place]) dx'd 09/2013   Lung ca   Concussion 2018   Diabetes ([place]) 09/08/2016   type I diabetic patient reported on 09/13/16    Diabetes mellitus    insulin   Diabetic neuropathy ([place]) 09/08/2016   Fall 2018   GERD (gastroesophageal reflux disease)    no meds for   History of colitis    History of hiatal hernia    Hx of radiation therapy 12/16/13-01/30/14   lung 66Gy   Hypertension    Malignant neoplasm of right upper lobe of lung ([place]) 11/28/2013   Neuropathy    Psoriasis    Psoriatic arthritis ([place]) 09/08/2016   Rheumatoid arthritis ([place])     Shortness of breath dyspnea    with exertion    Past Surgical History:  Procedure Laterality Date   ABDOMINAL HYSTERECTOMY     ABDOMINAL SURGERY     BALLOON DILATION N/A 12/12/2014   Procedure: BALLOON DILATION;  Surgeon: Inda Castle, MD;  Location: [facility];  Service: Endoscopy;  Laterality: N/A;   BIOPSY  01/20/2020   Procedure: BIOPSY;  Surgeon: Doran Stabler, MD;  Location: [facility];  Service: Gastroenterology;;   CHOLECYSTECTOMY     COLONOSCOPY N/A 02/07/2016   Procedure: COLONOSCOPY;  Surgeon: Doran Stabler, MD;  Location: [facility];  Service: Endoscopy;  Laterality: N/A;   DILATION AND CURETTAGE OF UTERUS     ESOPHAGOGASTRODUODENOSCOPY N/A 12/12/2014   Procedure: ESOPHAGOGASTRODUODENOSCOPY (EGD);  Surgeon: Inda Castle, MD;  Location: [facility];  Service: Endoscopy;  Laterality: N/A;  with dilation   ESOPHAGOGASTRODUODENOSCOPY (EGD) WITH PROPOFOL N/A 10/09/2014   Procedure: ESOPHAGOGASTRODUODENOSCOPY (EGD) WITH PROPOFOL;  Surgeon: Inda Castle, MD;  Location: [facility];  Service: Endoscopy;  Laterality: N/A;   ESOPHAGOGASTRODUODENOSCOPY (EGD) WITH PROPOFOL N/A 09/19/2016   Procedure: ESOPHAGOGASTRODUODENOSCOPY (EGD) WITH PROPOFOL;  Surgeon: Doran Stabler, MD;  [facility]: [facility];  Service: Gastroenterology;  Laterality: N/A;  with savary dil tt   ESOPHAGOGASTRODUODENOSCOPY (EGD) WITH PROPOFOL N/A 01/20/2020   Procedure: ESOPHAGOGASTRODUODENOSCOPY (EGD) WITH PROPOFOL;  Surgeon: Doran Stabler, MD;  [facility];  Service: Gastroenterology;  Laterality: N/A;  Fluoro-Savary   ORIF ANKLE FRACTURE Right 07/04/2015   Procedure: OPEN REDUCTION INTERNAL FIXATION (ORIF) ANKLE FRACTURE;  Surgeon: Newt Minion, MD;  Location: [facility];  Service: Orthopedics;  Laterality: Right;  OPEN REDUCTION, INTERNAL FIXATION OF RIGHT ANKLE FRACTURE.    ORIF ANKLE FRACTURE Left 02/10/2016   Procedure: OPEN REDUCTION INTERNAL FIXATION (ORIF) ANKLE  FRACTURE;  Surgeon: Newt Minion, MD;  Location: [facility];  Service: Orthopedics;  Laterality: Left;   SAVORY DILATION N/A 10/09/2014   Procedure: SAVORY DILATION;  Surgeon: Inda Castle, MD;  Location: [facility];  Service: Endoscopy;  Laterality: N/A;   SAVORY DILATION N/A 09/19/2016   Procedure: SAVORY DILATION;  Surgeon: Doran Stabler, MD;  [facility];  Service: Gastroenterology;  Laterality: N/A;   SAVORY DILATION N/A 01/20/2020   Procedure: SAVORY DILATION;  Surgeon: Doran Stabler, MD;  [facility];  Service: Gastroenterology;  Laterality: N/A;   TONSILLECTOMY      Social History   Socioeconomic History   Marital status: Divorced    Spouse name: Not on file   Number of children: 0   Years of education: MA   Highest education level: Master's degree (e.g., MA, MS, MEng, MEd, MSW, MBA)  Occupational History   Occupation: Retired    Fish farm manager: OTHER  Tobacco Use   Smoking status: Former Smoker    Packs/day: 3.00    Years: 45.00    Pack years: 135.00    Types: Cigarettes    Quit date: 12/05/1997    Years since quitting: 22.7   Smokeless tobacco: Never Used  Vaping Use   Vaping Use: Never used  Substance and Sexual Activity   Alcohol use: No    Alcohol/week: 0.0 standard drinks    Comment: rarely    Drug use: No   Sexual activity: Not Currently  Other Topics Concern   Not on file  Social History Narrative   Patient lives at home. She has an old friend who is her caregiver, Gladys Damme, who stays with her 24/7.   Caffeine Use: 1 cups daily   Right handed   Social Determinants of Health   Financial Resource Strain:    Difficulty of Paying Living Expenses: Not on file  Food Insecurity:    Worried About [place] in the Last Year: Not on file   Ran Out of Food in the Last Year: Not on file  Transportation Needs:    Lack of Transportation (Medical): Not on file   Lack of Transportation (Non-Medical):  Not on file  Physical Activity:    Days of Exercise per Week: Not on file   Minutes of Exercise per Session: Not on file  Stress:    Feeling of Stress : Not on file  Social Connections:    Frequency of Communication with Friends and Family: Not on file   Frequency of Social Gatherings with Friends and Family: Not on file   Attends Religious Services: Not on file   Active Member of Clubs or Organizations: Not on file   Attends Archivist Meetings: Not on file   Marital Status: Not on file  Intimate Partner Violence:    Fear of Current or Ex-Partner: Not on file   Emotionally Abused: Not on file   Physically Abused: Not on file   Sexually Abused: Not on file    Family History  Problem Relation Age of Onset   Other Mother  Anuerysm Mother        brain   Other Father        failure to thrive   Hypertension Other    Stroke Other    Heart attack Other    Hemachromatosis Other        great grandfather   Rheum arthritis Other        both sets of grandparents and father   Migraines Neg Hx    Colon cancer Neg Hx    Colon polyps Neg Hx    Esophageal cancer Neg Hx    Pancreatic cancer Neg Hx    Stomach cancer Neg Hx    Liver disease Neg Hx     Current Outpatient Medications  Medication Sig Dispense Refill   albuterol (PROVENTIL HFA;VENTOLIN HFA) 108 (90 Base) MCG/ACT inhaler Inhale 2 puffs into the lungs every 6 (six) hours as needed for wheezing or shortness of breath. 1 Inhaler 2   amLODipine (NORVASC) 10 MG tablet TAKE 1 TABLET(10 MG) BY MOUTH DAILY 90 tablet 1   augmented betamethasone dipropionate (DIPROLENE-AF) 0.05 % ointment      betamethasone dipropionate (DIPROLENE) 0.05 % cream Apply topically 2 (two) times daily. (Patient taking differently: Apply 1 application topically 2 (two) times daily. ) 45 g 0   budesonide-formoterol (SYMBICORT) 80-4.5 MCG/ACT inhaler Inhale 2 puffs into the lungs 2 (two) times daily. (Patient taking  differently: Inhale 2 puffs into the lungs daily as needed (Wheezing). ) 1 Inhaler 6   Continuous Blood Gluc Sensor (DEXCOM G6 SENSOR) MISC Apply new sensor every 10 days for continuous glucose monitoring. 9 each 3   Continuous Blood Gluc Transmit (DEXCOM G6 TRANSMITTER) MISC Use one transmitter every 3 months for continuous glucose monitoring. 1 each 3   DULoxetine (CYMBALTA) 60 MG capsule Take 1 capsule (60 mg total) by mouth 2 (two) times daily. 68 capsule 0   famotidine (PEPCID) 40 MG tablet Take 40 mg by mouth daily as needed for heartburn.      Fluocinolone Acetonide Scalp 0.01 % OIL      fluocinonide (LIDEX) 0.05 % external solution      gabapentin (NEURONTIN) 300 MG capsule TAKE 1 CAPSULE(300 MG) BY MOUTH THREE TIMES DAILY (Patient taking differently: Take 300 mg by mouth 3 (three) times daily. ) 270 capsule 4   GVOKE HYPOPEN 1-PACK 1 MG/0.2ML SOAJ Inject under skin 0.2 ml as needed for hypoglycemia 0.2 mL prn   insulin aspart (NOVOLOG FLEXPEN) 100 UNIT/ML FlexPen INJECT 8-12 UNITS UNDER THE SKIN THREE TIMES DAILY 45 mL 3   insulin degludec (TRESIBA FLEXTOUCH) 100 UNIT/ML FlexTouch Pen Inject 30 Units into the skin daily. 9 mL 3   Insulin Pen Needle (B-D UF III MINI PEN NEEDLES) 31G X 5 MM MISC Use with insulin pen 4 times a day to inject insulin 360 each 11   ketoconazole (NIZORAL) 2 % cream      metoprolol tartrate (LOPRESSOR) 50 MG tablet      OTEZLA 30 MG TABS      pravastatin (PRAVACHOL) 40 MG tablet Take 1 tablet (40 mg total) by mouth every evening. 90 tablet 3   solifenacin (VESICARE) 10 MG tablet Take 10 mg by mouth daily.     No current facility-administered medications for this visit.    Allergies  Allergen Reactions   Carboplatin Anaphylaxis, Shortness Of Breath and Other (See Comments)    Neuropathy   Remicade [Infliximab] Anaphylaxis   Other    Rifampin Other (See Comments)  unknown   Hydromorphone Rash    REVIEW OF SYSTEMS (negative unless  checked):  Cardiac:  []  Chest pain or chest pressure? []  Shortness of breath upon activity? []  Shortness of breath when lying flat? []  Irregular heart rhythm?  Vascular:  []  Pain in calf, thigh, or hip brought on by walking? []  Pain in feet at night that wakes you up from your sleep? []  Blood clot in your veins? []  Leg swelling?  Pulmonary:  []  Oxygen at home? []  Productive cough? []  Wheezing?  Neurologic:  []  Sudden weakness in arms or legs? []  Sudden numbness in arms or legs? []  Sudden onset of difficult speaking or slurred speech? []  Temporary loss of vision in one eye? []  Problems with dizziness?  Gastrointestinal:  []  Blood in stool? []  Vomited blood?  Genitourinary:  []  Burning when urinating? []  Blood in urine?  Psychiatric:  []  Major depression  Hematologic:  []  Bleeding problems? []  Problems with blood clotting?  Dermatologic:  []  Rashes or ulcers?  Constitutional:  []  Fever or chills?  Ear/Nose/Throat:  []  Change in hearing? []  Nose bleeds? []  Sore throat?  Musculoskeletal:  []  Back pain? []  Joint pain? []  Muscle pain?   Physical Examination     Vitals:   08/24/20 1235  BP: 119/68  Pulse: 97  Resp: 20  Temp: 98.7 F (37.1 C)  TempSrc: Temporal  SpO2: 97%  Weight: 161 lb 8 oz (73.3 kg)  Height: 5\' 6"  (1.676 m)   Body mass index is 26.07 kg/m.  General:  WDWN in NAD; vital signs documented above Gait: Normal HENT: WNL, normocephalic Pulmonary: normal non-labored breathing , without wheezing Cardiac: regular HR, without  Murmurs without carotid bruit Abdomen: soft, NT, no masses Vascular Exam/Pulses:  Right Left  Radial 2+ (normal) 2+ (normal)  Femoral 2+ (normal) 2+ (normal)  Popliteal 2+ (normal) 2+ (normal)  DP 2+ (normal) 2+ (normal)  PT 2+ (normal) 2+ (normal)   Extremities: with varicose veins, with reticular veins, without edema, without stasis pigmentation, without lipodermatosclerosis, without ulcers. There is a  1.5 cm x 1.5 cm dilated area of varicose vein in the posteromedial right leg with very thinned overlying skin   Musculoskeletal: no muscle wasting or atrophy  Neurologic: A&O X 3;  No focal weakness or paresthesias are detected Psychiatric:  The pt has Normal affect.  Non-invasive Vascular Imaging   BLE Venous Insufficiency Duplex (08/24/20):   RLE:   No DVT and SVT  GSV reflux mid thigh to SFJ  GSV diameter 0.40-0.75  No SSV reflux   CFV deep venous reflux  The GSV leaves the fascia in the distal thigh  Medical Decision Making   Megan [name] is a 79 y.o. female who presents with: RLE chronic venous insufficiency, with painful varicose vein. On duplex evaluation she has CFV deep reflux as well as superficial reflux. Her GSV is of adequate size throughout. It does leave the fascia in the distal thigh however I feel that she would be a good candidate for further evaluation for venous ablation. She is not interested in any intervention aside from treatment of the area she has superficial varicosity. She would like this area excised or injected. She otherwise says she will not tolerate compression stockings and she does not care to have any other procedure done. I have encouraged her to continue to elevate and exercise. I will schedule her to have a visit at the next availability with Dr. Oneida Alar or Dr. Scot Dock for further evaluation and management.  Karoline Caldwell, PA-C [facility] Office: [phone]  08/24/2020, 1:12 PM  Clinic MD: Dr. Trula Slade

## 2020-09-07 DIAGNOSIS — L400 Psoriasis vulgaris: Secondary | ICD-10-CM | POA: Diagnosis not present

## 2020-10-01 ENCOUNTER — Ambulatory Visit: Payer: Medicare PPO | Admitting: Internal Medicine

## 2020-10-01 ENCOUNTER — Encounter: Payer: Self-pay | Admitting: Internal Medicine

## 2020-10-01 ENCOUNTER — Other Ambulatory Visit: Payer: Self-pay

## 2020-10-01 VITALS — BP 130/80 | HR 110 | Ht 66.0 in | Wt 167.2 lb

## 2020-10-01 DIAGNOSIS — E782 Mixed hyperlipidemia: Secondary | ICD-10-CM | POA: Diagnosis not present

## 2020-10-01 DIAGNOSIS — E139 Other specified diabetes mellitus without complications: Secondary | ICD-10-CM

## 2020-10-01 DIAGNOSIS — Z23 Encounter for immunization: Secondary | ICD-10-CM | POA: Diagnosis not present

## 2020-10-01 NOTE — Progress Notes (Signed)
Patient ID: Megan [name], female   DOB: [birth year], 79 y.o.   MRN: [id number]  This visit occurred during the SARS-CoV-2 public health emergency.  Safety protocols were in place, including screening questions prior to the visit, additional usage of staff PPE, and extensive cleaning of exam room while observing appropriate contact time as indicated for disinfecting solutions.   HPI: Megan [name] is a 79 y.o.-year-old female, presenting for f/u for DM2, dx in 2013, and as LADA in 04/2016, insulin-dependent since 2014, uncontrolled, with complications (DKA, ? Related PN).  Last visit 2 months ago.  In 03/2020: Her friend found her in a coma>> taken to the hospital >> Glu 1058.  Before this, patient was eating a lot of sugary desserts (which he decided to eliminate since then).  Also, she was not taking the insulin as recommended. Before last visit, especially after she started her CGM sugars have not been as fluctuating as before.  Also, she did not have such severe lows.   Reviewed history: She has a complicated medical history, with a diagnosis of ulcerative colitis with significant diarrhea that caused dehydration in the past and episodes of DKA. She had 2 admissions for this in 02/26/2016 and 04/14/2016. While in rehabilitation, her CBGs dropped to 30s on her previous dose of insulin, therefore, her long-acting insulin was decreased.   She also has a history of lung cancer diagnosed 09/2013.  She had chemotherapy and radiation therapy for this.  Reviewed HbA1c levels: Lab Results  Component Value Date   HGBA1C 9.3 (A) 08/04/2020   HGBA1C 9.6 (A) 04/09/2020   HGBA1C 9.4 (H) 11/18/2019  12/30/2015: 12% 09/18/2015: 11.3% 06/17/2015: 7.5% 04/08/2015: 10.9% 09/10/2014: 8.5%  11/04/2013: 6.2%  Previously on: - Levemir  >> 12 >> 10 units 1-2x a day (may skip am doses)   - Humalog mealtime dose  - breakfast and dinner   - smaller meal: 15 units     Please do not skip the  insulin if sugars are 70-100.   If the sugars are 60-70, take 1/2 of the Humalog dose.  If the sugars are <60, skip insulin with that meal  No Humalog at bedtime, unless sugars >300: take 5 units or >400: 8 units She had a SSI before, but was not using it. She inquired about an insulin pump in the past, but after we discussed about the complexity of managing this, we decided to continue with insulin pens.  Currently on: - Tresiba 27-30 units daily - NovoLog mealtime dose:  B - 15 units L - 10 units D - 15 units              Please do not skip the insulin if sugars are 70-100.              If the sugars are 60-70, take 1/2 of the Humalog dose.             If the sugars are <60, skip insulin with that meal             No Novolog at bedtime.   You may need 3 units of Novolog before a snack.  She checks her sugars more than 4 times a day with her Dexcom G6 CGM: CGM parameters: - Average from CGM: 243 +/-77 >> 222 +/-87 - Percent time CGM active 94.5% >> 98.8% - Coefficient of variation: 31.7% >> 39.2%  Time in range:  - very low (<54): 0.0% >> 0% - low (<70): 0.4% >>  0.2% - normal range (70-180): 21% >> 34.7% - high sugars (>180): 78.7% >> 65.1% - very high sugars (>250): 45.5% >> 38.1%     Previously:   Previously:   Lowest sugar was 29 >> 45 >> 32 (after lunch) >> 59; she has hypoglycemia awareness in the 50s. Highest sugar was 528 >> 350 >> 1058 - but after hospitalization: 429 (at night) >> 350.  Pt's meals are: - Breakfast: Cereal or oatmeal with blueberries or eggs with sausage - Lunch: Sandwich and soup - Dinner: Meat, vegetables, small amount of starch - Snacks: 3-4 a day: Peanuts  -+ Mild CKD, last BUN/creatinine:  Lab Results  Component Value Date   BUN 17 08/05/2020   BUN 16 02/03/2020   CREATININE 1.09 (H) 08/05/2020   CREATININE 1.09 (H) 02/03/2020   -+ HL: last set of lipids: Lab Results  Component Value Date   CHOL 164 08/04/2020   HDL 49.50  08/04/2020   LDLCALC 97 08/04/2020   LDLDIRECT 156.0 05/10/2019   TRIG 86.0 08/04/2020   CHOLHDL 3 08/04/2020  On pravastatin 40.  - last eye exam was in 04/2020: No DR; Dr. Lady Gary. - + numbness but no tingling in her feet.  She has PN from ChTx and possibly also from diabetes.  Off Lyrica.  ROS: Constitutional: no weight gain/no weight loss, no fatigue, no subjective hyperthermia, no subjective hypothermia Eyes: no blurry vision, no xerophthalmia ENT: no sore throat, no nodules palpated in neck, no dysphagia, no odynophagia, no hoarseness Cardiovascular: no CP/+ SOB/no palpitations/no leg swelling Respiratory: no cough/+ SOB/+ wheezing Gastrointestinal: no N/no V/no D/no C/no acid reflux Musculoskeletal: no muscle aches/no joint aches Skin: no rashes, no hair loss Neurological: no tremors/no numbness/no tingling/no dizziness  I reviewed pt's medications, allergies, PMH, social hx, family hx, and changes were documented in the history of present illness. Otherwise, unchanged from my initial visit note.  Past Medical History:  Diagnosis Date  . Cancer ([place]) dx'd 09/2013   Lung ca  . Concussion 2018  . Diabetes ([place]) 09/08/2016   type I diabetic patient reported on 09/13/16   . Diabetes mellitus    insulin  . Diabetic neuropathy ([place]) 09/08/2016  . Fall 2018  . GERD (gastroesophageal reflux disease)    no meds for  . History of colitis   . History of hiatal hernia   . Hx of radiation therapy 12/16/13-01/30/14   lung 66Gy  . Hypertension   . Malignant neoplasm of right upper lobe of lung (IXL) 11/28/2013  . Neuropathy   . Psoriasis   . Psoriatic arthritis ([place]) 09/08/2016  . Rheumatoid arthritis ([place])   . Shortness of breath dyspnea    with exertion   Past Surgical History:  Procedure Laterality Date  . ABDOMINAL HYSTERECTOMY    . ABDOMINAL SURGERY    . BALLOON DILATION N/A 12/12/2014   Procedure: BALLOON DILATION;  Surgeon: Inda Castle, MD;  Location: [facility];   Service: Endoscopy;  Laterality: N/A;  . BIOPSY  01/20/2020   Procedure: BIOPSY;  Surgeon: Doran Stabler, MD;  Location: [facility];  Service: Gastroenterology;;  . CHOLECYSTECTOMY    . COLONOSCOPY N/A 02/07/2016   Procedure: COLONOSCOPY;  Surgeon: Doran Stabler, MD;  Location: [facility];  Service: Endoscopy;  Laterality: N/A;  . DILATION AND CURETTAGE OF UTERUS    . ESOPHAGOGASTRODUODENOSCOPY N/A 12/12/2014   Procedure: ESOPHAGOGASTRODUODENOSCOPY (EGD);  Surgeon: Inda Castle, MD;  Location: [facility];  Service: Endoscopy;  Laterality: N/A;  with dilation  . ESOPHAGOGASTRODUODENOSCOPY (EGD) WITH PROPOFOL N/A 10/09/2014   Procedure: ESOPHAGOGASTRODUODENOSCOPY (EGD) WITH PROPOFOL;  Surgeon: Inda Castle, MD;  Location: [place];  Service: Endoscopy;  Laterality: N/A;  . ESOPHAGOGASTRODUODENOSCOPY (EGD) WITH PROPOFOL N/A 09/19/2016   Procedure: ESOPHAGOGASTRODUODENOSCOPY (EGD) WITH PROPOFOL;  Surgeon: Doran Stabler, MD;  Location: [facility];  Service: Gastroenterology;  Laterality: N/A;  with savary dil tt  . ESOPHAGOGASTRODUODENOSCOPY (EGD) WITH PROPOFOL N/A 01/20/2020   Procedure: ESOPHAGOGASTRODUODENOSCOPY (EGD) WITH PROPOFOL;  Surgeon: Doran Stabler, MD;  Location: [facility];  Service: Gastroenterology;  Laterality: N/A;  Fluoro-Savary  . ORIF ANKLE FRACTURE Right 07/04/2015   Procedure: OPEN REDUCTION INTERNAL FIXATION (ORIF) ANKLE FRACTURE;  Surgeon: Newt Minion, MD;  Location: [place];  Service: Orthopedics;  Laterality: Right;  OPEN REDUCTION, INTERNAL FIXATION OF RIGHT ANKLE FRACTURE.   [name] ORIF ANKLE FRACTURE Left 02/10/2016   Procedure: OPEN REDUCTION INTERNAL FIXATION (ORIF) ANKLE FRACTURE;  Surgeon: Newt Minion, MD;  Location: [place];  Service: Orthopedics;  Laterality: Left;  . SAVORY DILATION N/A 10/09/2014   Procedure: SAVORY DILATION;  Surgeon: Inda Castle, MD;  Location: [place];  Service: Endoscopy;  Laterality: N/A;  . SAVORY DILATION  N/A 09/19/2016   Procedure: SAVORY DILATION;  Surgeon: Doran Stabler, MD;  Location: [facility];  Service: Gastroenterology;  Laterality: N/A;  . SAVORY DILATION N/A 01/20/2020   Procedure: SAVORY DILATION;  Surgeon: Doran Stabler, MD;  Location: [facility];  Service: Gastroenterology;  Laterality: N/A;  . TONSILLECTOMY     Social History   Social History  . Marital Status: Divorced    Spouse Name: N/A  . Number of Children: 0   Occupational History  . Retired Pharmacist, hospital Other   Social History Main Topics  . Smoking status: Former Smoker -- 3.00 packs/day for 45 years    Types: Cigarettes    Quit date: 12/05/1997  . Smokeless tobacco: Never Used  . Alcohol Use: No  . Drug Use: No   Social History Narrative   Patient lives at home alone.   Caffeine Use: 2 cups daily   Current Outpatient Medications on File Prior to Visit  Medication Sig Dispense Refill  . albuterol (PROVENTIL HFA;VENTOLIN HFA) 108 (90 Base) MCG/ACT inhaler Inhale 2 puffs into the lungs every 6 (six) hours as needed for wheezing or shortness of breath. 1 Inhaler 2  . amLODipine (NORVASC) 10 MG tablet TAKE 1 TABLET(10 MG) BY MOUTH DAILY 90 tablet 1  . augmented betamethasone dipropionate (DIPROLENE-AF) 0.05 % ointment     . betamethasone dipropionate (DIPROLENE) 0.05 % cream Apply topically 2 (two) times daily. (Patient taking differently: Apply 1 application topically 2 (two) times daily. ) 45 g 0  . budesonide-formoterol (SYMBICORT) 80-4.5 MCG/ACT inhaler Inhale 2 puffs into the lungs 2 (two) times daily. (Patient taking differently: Inhale 2 puffs into the lungs daily as needed (Wheezing). ) 1 Inhaler 6  . Continuous Blood Gluc Sensor (DEXCOM G6 SENSOR) MISC Apply new sensor every 10 days for continuous glucose monitoring. 9 each 3  . Continuous Blood Gluc Transmit (DEXCOM G6 TRANSMITTER) MISC Use one transmitter every 3 months for continuous glucose monitoring. 1 each 3  . DULoxetine (CYMBALTA) 60 MG  capsule Take 1 capsule (60 mg total) by mouth 2 (two) times daily. 68 capsule 0  . famotidine (PEPCID) 40 MG tablet Take 40 mg by mouth daily as needed for heartburn.     . Fluocinolone Acetonide Scalp  0.01 % OIL     . fluocinonide (LIDEX) 0.05 % external solution     . gabapentin (NEURONTIN) 300 MG capsule TAKE 1 CAPSULE(300 MG) BY MOUTH THREE TIMES DAILY (Patient taking differently: Take 300 mg by mouth 3 (three) times daily. ) 270 capsule 4  . GVOKE HYPOPEN 1-PACK 1 MG/0.2ML SOAJ Inject under skin 0.2 ml as needed for hypoglycemia 0.2 mL prn  . insulin aspart (NOVOLOG FLEXPEN) 100 UNIT/ML FlexPen INJECT 8-12 UNITS UNDER THE SKIN THREE TIMES DAILY 45 mL 3  . insulin degludec (TRESIBA FLEXTOUCH) 100 UNIT/ML FlexTouch Pen Inject 30 Units into the skin daily. 9 mL 3  . Insulin Pen Needle (B-D UF III MINI PEN NEEDLES) 31G X 5 MM MISC Use with insulin pen 4 times a day to inject insulin 360 each 11  . ketoconazole (NIZORAL) 2 % cream     . metoprolol tartrate (LOPRESSOR) 50 MG tablet     . OTEZLA 30 MG TABS     . pravastatin (PRAVACHOL) 40 MG tablet Take 1 tablet (40 mg total) by mouth every evening. 90 tablet 3  . solifenacin (VESICARE) 10 MG tablet Take 10 mg by mouth daily.     No current facility-administered medications on file prior to visit.   Allergies  Allergen Reactions  . Carboplatin Anaphylaxis, Shortness Of Breath and Other (See Comments)    Neuropathy  . Remicade [Infliximab] Anaphylaxis  . Other   . Rifampin Other (See Comments)    unknown  . Hydromorphone Rash   Family History  Problem Relation Age of Onset  . Other Mother   . Anuerysm Mother        brain  . Other Father        failure to thrive  . Hypertension Other   . Stroke Other   . Heart attack Other   . Hemachromatosis Other        great grandfather  . Rheum arthritis Other        both sets of grandparents and father  . Migraines Neg Hx   . Colon cancer Neg Hx   . Colon polyps Neg Hx   . Esophageal  cancer Neg Hx   . Pancreatic cancer Neg Hx   . Stomach cancer Neg Hx   . Liver disease Neg Hx    PE: There were no vitals taken for this visit. Wt Readings from Last 3 Encounters:  08/24/20 161 lb 8 oz (73.3 kg)  08/12/20 160 lb (72.6 kg)  08/04/20 158 lb (71.7 kg)   Constitutional: normal weight, in NAD Eyes: PERRLA, EOMI, no exophthalmos ENT: moist mucous membranes, no thyromegaly, no cervical lymphadenopathy Cardiovascular: RRR, No MRG, + B LE edema Respiratory: CTA B Gastrointestinal: abdomen soft, NT, ND, BS+ Musculoskeletal: no deformities, strength intact in all 4 Skin: moist, warm, no rashes Neurological: no tremor with outstretched hands, DTR normal in all 4  ASSESSMENT: 1. LADA, insulin-dependent, uncontrolled, with complications - DKA in the setting of dehydration (- PN) - this may be 2/2 ChTx.  Pulmonologist: Dr. Chase Caller Oncologist: Dr. Earlie Server Cardiologist: Dr. Sallyanne Kuster Ophthalmologist: Dr. Clifton James  2.  Peripheral neuropathy -Possibly from Diabetes  PLAN:  1. Patient with longstanding, poorly controlled diabetes, diagnosed as LADA, with very fluctuating CBGs, on a basal/bolus insulin regimen.  In 03/2020, she returns after long absence of more than a year and after being admitted to the hospital with a blood sugar higher than 1000.  At that time, she was off her CGM but afterwards,  she obtained it again.  Sugars started to improve afterwards but they are still quite fluctuating.  No significant lows recently. -At last visit, she was still taking fixed doses of NovoLog, 7 units before each meal.  We discussed that this was not enough.  We increased both  NovoLog and Tresiba doses at that time CGM interpretation: -At today's visit, we reviewed her CGM downloads: It appears that only 34.7% of values are in target range, but this is an improvement from last visit, when only 21% of the values were in range.  65.1% of her sugars are higher than 180, and 0.2% are  lower than 70.  The calculated average blood sugar is 222.   -Reviewing the CGM trends, sugars are high overnight and then they drop after initial increase after breakfast.  At lunchtime, she has the blood sugars of the day, but even these can be up to 200s.  After lunch her sugars increase precipitously and they remain high until approximately 9 AM the next morning. -Therefore, at this visit, we discussed about the importance of taking more NovoLog insulin with lunch.  Since she prefers fixed doses, I advised her to increase the dose from 10 units to 15 units before lunch. -We also discussed about decreasing the  dose of Tresiba to 36 units. -If after these changes, her sugars are lower after breakfast, I advised her to decrease the dose of NovoLog before this meal - I suggested to:  Patient Instructions  Please  increase: - Tresiba 30 >> 36 units daily - NovoLog mealtime dose B - 15 units (decrease the dose to 12 units if sugars continue to drop after changing lunchtime dose) L - 10 units >> 15 units D - 15 units              Please do not skip the insulin if sugars are 70-100.              If the sugars are 60-70, take 1/2 of the Humalog dose.             If the sugars are <60, skip insulin with that meal             No Novolog at bedtime.  You may need 3 units of Novolog before a snack.             Please return in 3 months.   - advised to check sugars at different times of the day - 4x a day, rotating check times - advised for yearly eye exams >> she is UTD - return to clinic in 3 months  2. PN  -Stable, without pain but she does have numbness -She was previously on Lyrica but she came off  3.  Hyperlipidemia - Reviewed latest lipid panel from last visit: LDL above goal but improved, the rest of the fractions at goal: Lab Results  Component Value Date   CHOL 164 08/04/2020   HDL 49.50 08/04/2020   LDLCALC 97 08/04/2020   LDLDIRECT 156.0 05/10/2019   TRIG 86.0 08/04/2020    CHOLHDL 3 08/04/2020  -Continues pravastatin 40 without side effects  + Flu shot today  Philemon Kingdom, MD PhD [facility] Endocrinology

## 2020-10-01 NOTE — Patient Instructions (Addendum)
Please  increase: - Tresiba 30 >> 36 units daily - NovoLog mealtime dose B - 15 units (decrease the dose to 12 units if sugars continue to drop after changing lunchtime dose) L - 10 units >> 15 units D - 15 units              Please do not skip the insulin if sugars are 70-100.              If the sugars are 60-70, take 1/2 of the Humalog dose.             If the sugars are <60, skip insulin with that meal             No Novolog at bedtime.  You may need 3 units of Novolog before a snack.             Please return in 3 months.

## 2020-10-08 ENCOUNTER — Other Ambulatory Visit: Payer: Self-pay

## 2020-10-08 NOTE — Progress Notes (Signed)
Subjective:    Patient ID: Megan [name], female    DOB: 1941/02/12, 79 y.o.   MRN: [id number]  HPI The patient is here for follow up of their chronic medical problems, including htn, DM, diabetic neuropathy, GERD  She is not compliant with a diabetic diet.  She is not exercising regularly.  [facility].  Medications and allergies reviewed with patient and updated if appropriate.  Patient Active Problem List   Diagnosis Date Noted  . Aortic atherosclerosis ([place]) 10/09/2020  . Fall 05/04/2020  . Hx of noncompliance with medical treatment, presenting hazards to health 05/04/2020  . Concussion 02/19/2020  . Head pain 05/10/2019  . GERD (gastroesophageal reflux disease) 10/17/2018  . CAP (community acquired pneumonia) 10/15/2018  . Post herpetic neuralgia 03/30/2018  . Herpes zoster without complication 11/94/1740  . Right elbow pain 03/01/2018  . Carpal tunnel syndrome on right 11/30/2017  . Cough 08/22/2017  . Hypokalemia 03/22/2017  . Primary osteoarthritis of both hands 03/08/2017  . Poor balance 02/07/2017  . Subarachnoid bleed ([place]) 01/11/2017  . Psoriatic arthropathy ([place]) 09/08/2016  . Hypertension 09/08/2016  . Diabetic neuropathy ([place]) 09/08/2016  . LADA (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), managed as type 1 ([place]) 05/04/2016  . Syncope 03/25/2016  . Malnutrition of moderate degree 02/29/2016  . RLS (restless legs syndrome) 12/11/2015  . Lumbago 05/06/2015  . Peripheral edema 04/14/2015  . Psoriasis 04/14/2015  . Emphysema of lung ([place]) 11/26/2014  . Radiation-induced esophageal stricture 10/01/2014  . Tachycardia 08/27/2014  . DNR (do not resuscitate) discussion 08/21/2014  . Cancer associated pain 07/29/2014  . Neuropathy due to chemotherapeutic drug ([place]) 07/18/2014  . Leg cramps 07/18/2014  . Dyspnea 04/28/2014  . Radiation esophagitis 04/10/2014  . Cancer of middle lobe of lung ([place]) 01/17/2014  . Malignant neoplasm of right  upper lobe of lung ([place]) 11/28/2013    Current Outpatient Medications on File Prior to Visit  Medication Sig Dispense Refill  . albuterol (PROVENTIL HFA;VENTOLIN HFA) 108 (90 Base) MCG/ACT inhaler Inhale 2 puffs into the lungs every 6 (six) hours as needed for wheezing or shortness of breath. 1 Inhaler 2  . amLODipine (NORVASC) 10 MG tablet TAKE 1 TABLET(10 MG) BY MOUTH DAILY 90 tablet 1  . augmented betamethasone dipropionate (DIPROLENE-AF) 0.05 % ointment     . betamethasone dipropionate (DIPROLENE) 0.05 % cream Apply topically 2 (two) times daily. (Patient taking differently: Apply 1 application topically 2 (two) times daily.) 45 g 0  . budesonide-formoterol (SYMBICORT) 80-4.5 MCG/ACT inhaler Inhale 2 puffs into the lungs 2 (two) times daily. (Patient taking differently: Inhale 2 puffs into the lungs daily as needed (Wheezing).) 1 Inhaler 6  . Continuous Blood Gluc Sensor (DEXCOM G6 SENSOR) MISC Apply new sensor every 10 days for continuous glucose monitoring. 9 each 3  . Continuous Blood Gluc Transmit (DEXCOM G6 TRANSMITTER) MISC Use one transmitter every 3 months for continuous glucose monitoring. 1 each 3  . DULoxetine (CYMBALTA) 60 MG capsule Take 1 capsule (60 mg total) by mouth 2 (two) times daily. 68 capsule 0  . Fluocinolone Acetonide Scalp 0.01 % OIL     . fluocinonide (LIDEX) 0.05 % external solution     . gabapentin (NEURONTIN) 300 MG capsule TAKE 1 CAPSULE(300 MG) BY MOUTH THREE TIMES DAILY (Patient taking differently: Take 300 mg by mouth 3 (three) times daily.) 270 capsule 4  . GVOKE HYPOPEN 1-PACK 1 MG/0.2ML SOAJ Inject under skin 0.2 ml as needed for hypoglycemia 0.2  mL prn  . insulin aspart (NOVOLOG FLEXPEN) 100 UNIT/ML FlexPen INJECT 8-12 UNITS UNDER THE SKIN THREE TIMES DAILY 45 mL 3  . insulin degludec (TRESIBA FLEXTOUCH) 100 UNIT/ML FlexTouch Pen Inject 30 Units into the skin daily. 9 mL 3  . Insulin Pen Needle (B-D UF III MINI PEN NEEDLES) 31G X 5 MM MISC Use with insulin  pen 4 times a day to inject insulin 360 each 11  . ketoconazole (NIZORAL) 2 % cream     . metoprolol tartrate (LOPRESSOR) 50 MG tablet     . OTEZLA 30 MG TABS     . solifenacin (VESICARE) 10 MG tablet Take 10 mg by mouth daily.    . pravastatin (PRAVACHOL) 40 MG tablet Take 1 tablet (40 mg total) by mouth every evening. 90 tablet 3   No current facility-administered medications on file prior to visit.    Past Medical History:  Diagnosis Date  . Cancer ([place]) dx'd 09/2013   Lung ca  . Concussion 2018  . Diabetes ([place]) 09/08/2016   type I diabetic patient reported on 09/13/16   . Diabetes mellitus    insulin  . Diabetic neuropathy ([place]) 09/08/2016  . Fall 2018  . GERD (gastroesophageal reflux disease)    no meds for  . History of colitis   . History of hiatal hernia   . Hx of radiation therapy 12/16/13-01/30/14   lung 66Gy  . Hypertension   . Malignant neoplasm of right upper lobe of lung ([place]) 11/28/2013  . Neuropathy   . Psoriasis   . Psoriatic arthritis ([place]) 09/08/2016  . Rheumatoid arthritis ([place])   . Shortness of breath dyspnea    with exertion    Past Surgical History:  Procedure Laterality Date  . ABDOMINAL HYSTERECTOMY    . ABDOMINAL SURGERY    . BALLOON DILATION N/A 12/12/2014   Procedure: BALLOON DILATION;  Surgeon: Inda Castle, MD;  Location: [facility];  Service: Endoscopy;  Laterality: N/A;  . BIOPSY  01/20/2020   Procedure: BIOPSY;  Surgeon: Doran Stabler, MD;  Location: [facility];  Service: Gastroenterology;;  . CHOLECYSTECTOMY    . COLONOSCOPY N/A 02/07/2016   Procedure: COLONOSCOPY;  Surgeon: Doran Stabler, MD;  Location: [facility];  Service: Endoscopy;  Laterality: N/A;  . DILATION AND CURETTAGE OF UTERUS    . ESOPHAGOGASTRODUODENOSCOPY N/A 12/12/2014   Procedure: ESOPHAGOGASTRODUODENOSCOPY (EGD);  Surgeon: Inda Castle, MD;  Location: [facility];  Service: Endoscopy;  Laterality: N/A;  with dilation  . ESOPHAGOGASTRODUODENOSCOPY (EGD)  WITH PROPOFOL N/A 10/09/2014   Procedure: ESOPHAGOGASTRODUODENOSCOPY (EGD) WITH PROPOFOL;  Surgeon: Inda Castle, MD;  Location: [facility];  Service: Endoscopy;  Laterality: N/A;  . ESOPHAGOGASTRODUODENOSCOPY (EGD) WITH PROPOFOL N/A 09/19/2016   Procedure: ESOPHAGOGASTRODUODENOSCOPY (EGD) WITH PROPOFOL;  Surgeon: Doran Stabler, MD;  Location: [facility];  Service: Gastroenterology;  Laterality: N/A;  with savary dil tt  . ESOPHAGOGASTRODUODENOSCOPY (EGD) WITH PROPOFOL N/A 01/20/2020   Procedure: ESOPHAGOGASTRODUODENOSCOPY (EGD) WITH PROPOFOL;  Surgeon: Doran Stabler, MD;  Location: [facility];  Service: Gastroenterology;  Laterality: N/A;  Fluoro-Savary  . ORIF ANKLE FRACTURE Right 07/04/2015   Procedure: OPEN REDUCTION INTERNAL FIXATION (ORIF) ANKLE FRACTURE;  Surgeon: Newt Minion, MD;  Location: [facility];  Service: Orthopedics;  Laterality: Right;  OPEN REDUCTION, INTERNAL FIXATION OF RIGHT ANKLE FRACTURE.   [name] ORIF ANKLE FRACTURE Left 02/10/2016   Procedure: OPEN REDUCTION INTERNAL FIXATION (ORIF) ANKLE FRACTURE;  Surgeon: Newt Minion, MD;  Location: [facility];  Service: Orthopedics;  Laterality: Left;  . SAVORY DILATION N/A 10/09/2014   Procedure: SAVORY DILATION;  Surgeon: Inda Castle, MD;  Location: [facility];  Service: Endoscopy;  Laterality: N/A;  . SAVORY DILATION N/A 09/19/2016   Procedure: SAVORY DILATION;  Surgeon: Doran Stabler, MD;  Location: [facility];  Service: Gastroenterology;  Laterality: N/A;  . SAVORY DILATION N/A 01/20/2020   Procedure: SAVORY DILATION;  Surgeon: Doran Stabler, MD;  Location: [facility];  Service: Gastroenterology;  Laterality: N/A;  . TONSILLECTOMY      Social History   Socioeconomic History  . Marital status: Divorced    Spouse name: Not on file  . Number of children: 0  . Years of education: MA  . Highest education level: Master's degree (e.g., MA, MS, MEng, MEd, MSW, MBA)  Occupational History  . Occupation: Retired     Fish farm manager: OTHER  Tobacco Use  . Smoking status: Former Smoker    Packs/day: 3.00    Years: 45.00    Pack years: 135.00    Types: Cigarettes    Quit date: 12/05/1997    Years since quitting: 22.8  . Smokeless tobacco: Never Used  Vaping Use  . Vaping Use: Never used  Substance and Sexual Activity  . Alcohol use: No    Alcohol/week: 0.0 standard drinks    Comment: rarely   . Drug use: No  . Sexual activity: Not Currently  Other Topics Concern  . Not on file  Social History Narrative   Patient lives at home. She has an old friend who is her caregiver, Gladys Damme, who stays with her 24/7.   Caffeine Use: 1 cups daily   Right handed   Social Determinants of Health   Financial Resource Strain: Not on file  Food Insecurity: Not on file  Transportation Needs: Not on file  Physical Activity: Not on file  Stress: Not on file  Social Connections: Not on file    Family History  Problem Relation Age of Onset  . Other Mother   . Anuerysm Mother        brain  . Other Father        failure to thrive  . Hypertension Other   . Stroke Other   . Heart attack Other   . Hemachromatosis Other        great grandfather  . Rheum arthritis Other        both sets of grandparents and father  . Migraines Neg Hx   . Colon cancer Neg Hx   . Colon polyps Neg Hx   . Esophageal cancer Neg Hx   . Pancreatic cancer Neg Hx   . Stomach cancer Neg Hx   . Liver disease Neg Hx     Review of Systems  Constitutional: Negative for chills and fever.  Respiratory: Positive for cough (dry), shortness of breath (with exertion) and wheezing.   Cardiovascular: Positive for leg swelling (mild). Negative for chest pain and palpitations.  Neurological: Positive for headaches. Negative for light-headedness.       Objective:   Vitals:   10/09/20 1002  BP: 126/70  Pulse: 92  Temp: 97.9 F (36.6 C)  SpO2: 97%   BP Readings from Last 3 Encounters:  10/09/20 126/70  10/01/20 130/80   08/24/20 119/68   Wt Readings from Last 3 Encounters:  10/09/20 166 lb (75.3 kg)  10/01/20 167 lb 3.2 oz (75.8 kg)  08/24/20 161 lb 8 oz (73.3  kg)   Body mass index is 26.79 kg/m.   Physical Exam    Constitutional: Appears well-developed and well-nourished. No distress.  HENT:  Head: Normocephalic and atraumatic.  Neck: Neck supple. No tracheal deviation present. No thyromegaly present.  No cervical lymphadenopathy Cardiovascular: Normal rate, regular rhythm and normal heart sounds.   No murmur heard. No carotid bruit .  No edema Pulmonary/Chest: Effort normal and breath sounds normal. No respiratory distress. No has no wheezes. No rales.  Skin: Skin is warm and dry. Not diaphoretic.  Psychiatric: Normal mood and affect. Behavior is normal.      Assessment & Plan:    See Problem List for Assessment and Plan of chronic medical problems.    This visit occurred during the SARS-CoV-2 public health emergency.  Safety protocols were in place, including screening questions prior to the visit, additional usage of staff PPE, and extensive cleaning of exam room while observing appropriate contact time as indicated for disinfecting solutions.

## 2020-10-08 NOTE — Patient Instructions (Addendum)
  Blood work was ordered.     Start using your inhalers.   Work on your diet to improve your heartburn.    Medications changes include :   none    Please followup in 6 months

## 2020-10-09 ENCOUNTER — Encounter: Payer: Self-pay | Admitting: Internal Medicine

## 2020-10-09 ENCOUNTER — Ambulatory Visit (INDEPENDENT_AMBULATORY_CARE_PROVIDER_SITE_OTHER): Payer: Medicare PPO | Admitting: Internal Medicine

## 2020-10-09 VITALS — BP 126/70 | HR 92 | Temp 97.9°F | Ht 66.0 in | Wt 166.0 lb

## 2020-10-09 DIAGNOSIS — J439 Emphysema, unspecified: Secondary | ICD-10-CM

## 2020-10-09 DIAGNOSIS — I10 Essential (primary) hypertension: Secondary | ICD-10-CM

## 2020-10-09 DIAGNOSIS — E1149 Type 2 diabetes mellitus with other diabetic neurological complication: Secondary | ICD-10-CM | POA: Diagnosis not present

## 2020-10-09 DIAGNOSIS — L4050 Arthropathic psoriasis, unspecified: Secondary | ICD-10-CM

## 2020-10-09 DIAGNOSIS — K219 Gastro-esophageal reflux disease without esophagitis: Secondary | ICD-10-CM | POA: Diagnosis not present

## 2020-10-09 DIAGNOSIS — E139 Other specified diabetes mellitus without complications: Secondary | ICD-10-CM | POA: Diagnosis not present

## 2020-10-09 DIAGNOSIS — I700 Atherosclerosis of aorta: Secondary | ICD-10-CM

## 2020-10-09 DIAGNOSIS — Z1159 Encounter for screening for other viral diseases: Secondary | ICD-10-CM | POA: Diagnosis not present

## 2020-10-09 LAB — COMPREHENSIVE METABOLIC PANEL
ALT: 12 U/L (ref 0–35)
AST: 13 U/L (ref 0–37)
Albumin: 4.1 g/dL (ref 3.5–5.2)
Alkaline Phosphatase: 116 U/L (ref 39–117)
BUN: 20 mg/dL (ref 6–23)
CO2: 30 mEq/L (ref 19–32)
Calcium: 9.5 mg/dL (ref 8.4–10.5)
Chloride: 100 mEq/L (ref 96–112)
Creatinine, Ser: 0.97 mg/dL (ref 0.40–1.20)
GFR: 55.84 mL/min — ABNORMAL LOW (ref >60.00)
Glucose, Bld: 219 mg/dL — ABNORMAL HIGH (ref 70–99)
Potassium: 3.7 mEq/L (ref 3.5–5.1)
Sodium: 138 mEq/L (ref 135–145)
Total Bilirubin: 0.5 mg/dL (ref 0.2–1.2)
Total Protein: 7.2 g/dL (ref 6.0–8.3)

## 2020-10-09 LAB — CBC WITH DIFFERENTIAL/PLATELET
Basophils Absolute: 0.1 10*3/uL (ref 0.0–0.1)
Basophils Relative: 0.7 % (ref 0.0–3.0)
Eosinophils Absolute: 0.2 10*3/uL (ref 0.0–0.7)
Eosinophils Relative: 1.9 % (ref 0.0–5.0)
HCT: 42.4 % (ref 36.0–46.0)
Hemoglobin: 13.9 g/dL (ref 12.0–15.0)
Lymphocytes Relative: 14.8 % (ref 12.0–46.0)
Lymphs Abs: 1.5 10*3/uL (ref 0.7–4.0)
MCHC: 32.9 g/dL (ref 30.0–36.0)
MCV: 86.4 fl (ref 78.0–100.0)
Monocytes Absolute: 0.6 10*3/uL (ref 0.1–1.0)
Monocytes Relative: 6.1 % (ref 3.0–12.0)
Neutro Abs: 7.7 10*3/uL (ref 1.4–7.7)
Neutrophils Relative %: 76.5 % (ref 43.0–77.0)
Platelets: 248 10*3/uL (ref 150.0–400.0)
RBC: 4.91 Mil/uL (ref 3.87–5.11)
RDW: 15.8 % — ABNORMAL HIGH (ref 11.5–15.5)
WBC: 10.1 10*3/uL (ref 4.0–10.5)

## 2020-10-09 LAB — LIPID PANEL
Cholesterol: 179 mg/dL (ref 0–200)
HDL: 58.5 mg/dL (ref >39.00)
LDL Cholesterol: 105 mg/dL — ABNORMAL HIGH (ref 0–99)
NonHDL: 120.2
Total CHOL/HDL Ratio: 3
Triglycerides: 78 mg/dL (ref 0.0–149.0)
VLDL: 15.6 mg/dL (ref 0.0–40.0)

## 2020-10-09 LAB — MICROALBUMIN / CREATININE URINE RATIO
Creatinine,U: 161.6 mg/dL
Microalb Creat Ratio: 5.8 mg/g (ref 0.0–30.0)
Microalb, Ur: 9.4 mg/dL — ABNORMAL HIGH (ref 0.0–1.9)

## 2020-10-09 NOTE — Assessment & Plan Note (Signed)
Chronic States dry cough, shortness of breath with exertion wheezing Not currently using her inhalers Advised restarting her inhalers-Symbicort 80-4.5 mcg per ACT twice daily and albuterol inhaler as needed

## 2020-10-09 NOTE — Assessment & Plan Note (Signed)
Chronic Blood pressure well controlled Continue amlodipine 10 mg daily twice daily CMP

## 2020-10-09 NOTE — Assessment & Plan Note (Signed)
Chronic Management per Dr. Cruzita Lederer Stressed the importance of a diabetic diet

## 2020-10-09 NOTE — Assessment & Plan Note (Addendum)
Chronic Started on pravastatin 40 mg daily in August '21 Check lipid panel today Encouraged regular exercise and heart healthy diet

## 2020-10-09 NOTE — Assessment & Plan Note (Signed)
Chronic Denies any active pain Not currently on any medication and not currently following with rheumatology

## 2020-10-09 NOTE — Assessment & Plan Note (Signed)
Chronic Encouraged better compliance with a diabetic diet Following with Dr. Jaynee Eagles Continue gabapentin 300 mg 3 times daily and Cymbalta 60 mg twice daily

## 2020-10-09 NOTE — Assessment & Plan Note (Signed)
Chronic GERD not controlled - having it almost day for the past week Currently only taking tums as needed No dysphagia Declines medication  Will work on diet changes

## 2020-10-12 LAB — HEPATITIS C ANTIBODY
Hepatitis C Ab: NONREACTIVE
SIGNAL TO CUT-OFF: 0.02 (ref <1.00)

## 2020-10-22 ENCOUNTER — Other Ambulatory Visit: Payer: Self-pay

## 2020-10-22 ENCOUNTER — Encounter: Payer: Self-pay | Admitting: Vascular Surgery

## 2020-10-22 ENCOUNTER — Ambulatory Visit (INDEPENDENT_AMBULATORY_CARE_PROVIDER_SITE_OTHER): Payer: Medicare PPO | Admitting: Vascular Surgery

## 2020-10-22 VITALS — BP 123/72 | HR 110 | Temp 98.1°F | Resp 20 | Ht 66.0 in | Wt 168.8 lb

## 2020-10-22 DIAGNOSIS — I872 Venous insufficiency (chronic) (peripheral): Secondary | ICD-10-CM

## 2020-10-22 NOTE — Progress Notes (Signed)
REASON FOR VISIT:   Follow-up of chronic venous insufficiency  MEDICAL ISSUES:   CHRONIC VENOUS INSUFFICIENCY: This patient has CEAP C4a venous disease.  We have discussed the importance of intermittent leg elevation and the proper positioning for this.  She does not like to wear compression stockings.  I did discuss the potential use of a stocking donner to help her get her stockings on.  I encouraged her to exercise we specifically talked about walking and water aerobics.  I encouraged her to avoid prolonged sitting and standing.  We also discussed the importance of maintaining a healthy weight.  If her symptoms progress then I think she would be a candidate for laser ablation of the right great saphenous vein in the thigh.  She has reflux from the saphenofemoral junction to the mid thigh and the vein is significantly dilated.  However for now she would like to continue with conservative measures and I think this is perfectly reasonable given her age.    HPI:   Megan [name] is a pleasant 79 y.o. female who was seen by Karoline Caldwell, PA on 08/24/2020 for evaluation of varicose veins behind the right knee.  She subsequently developed an episode of what sounds like phlebitis in this area.  She has been having some burning pain stinging and swelling associated with her venous disease.  She had no previous history of DVT and no previous venous procedures.  She had an unusual appearing varicosity adjacent to her right knee which is documented in the photograph on 08/24/2020.  She had reflux in her deep system involving the common femoral vein and also superficial venous reflux.  She was encouraged to elevate her legs and exercise.  She states that she did not tolerate compression stockings.  She comes in for 104-month follow-up visit.  Since her last visit the area behind her right knee has improved significantly.  The induration has softened up and the erythema has resolved.  She has a  resolving phlebitis here.  She does have varicose veins bilaterally but is really asymptomatic.  She does not describe any significant symptoms associated with her varicose veins.  She has some mild leg swelling which is chronic.  She denies any previous history of DVT and she has had no previous venous procedures.  She states that she cannot get on compression stockings.  She does elevate her legs.  She does have a history of lung cancer.  Past Medical History:  Diagnosis Date  . Cancer ([place]) dx'd 09/2013   Lung ca  . Concussion 2018  . Diabetes ([place]) 09/08/2016   type I diabetic patient reported on 09/13/16   . Diabetes mellitus    insulin  . Diabetic neuropathy ([place]) 09/08/2016  . Fall 2018  . GERD (gastroesophageal reflux disease)    no meds for  . History of colitis   . History of hiatal hernia   . Hx of radiation therapy 12/16/13-01/30/14   lung 66Gy  . Hypertension   . Malignant neoplasm of right upper lobe of lung ([place]) 11/28/2013  . Neuropathy   . Psoriasis   . Psoriatic arthritis ([place]) 09/08/2016  . Rheumatoid arthritis ([place])   . Shortness of breath dyspnea    with exertion    Family History  Problem Relation Age of Onset  . Other Mother   . Anuerysm Mother        brain  . Other Father        failure to thrive  .  Hypertension Other   . Stroke Other   . Heart attack Other   . Hemachromatosis Other        great grandfather  . Rheum arthritis Other        both sets of grandparents and father  . Migraines Neg Hx   . Colon cancer Neg Hx   . Colon polyps Neg Hx   . Esophageal cancer Neg Hx   . Pancreatic cancer Neg Hx   . Stomach cancer Neg Hx   . Liver disease Neg Hx     SOCIAL HISTORY: Social History   Tobacco Use  . Smoking status: Former Smoker    Packs/day: 3.00    Years: 45.00    Pack years: 135.00    Types: Cigarettes    Quit date: 12/05/1997    Years since quitting: 22.8  . Smokeless tobacco: Never Used  Substance Use Topics  . Alcohol use: No     Alcohol/week: 0.0 standard drinks    Comment: rarely     Allergies  Allergen Reactions  . Carboplatin Anaphylaxis, Shortness Of Breath and Other (See Comments)    Neuropathy  . Remicade [Infliximab] Anaphylaxis  . Other   . Rifampin Other (See Comments)    unknown  . Hydromorphone Rash    Current Outpatient Medications  Medication Sig Dispense Refill  . albuterol (PROVENTIL HFA;VENTOLIN HFA) 108 (90 Base) MCG/ACT inhaler Inhale 2 puffs into the lungs every 6 (six) hours as needed for wheezing or shortness of breath. 1 Inhaler 2  . amLODipine (NORVASC) 10 MG tablet TAKE 1 TABLET(10 MG) BY MOUTH DAILY 90 tablet 1  . augmented betamethasone dipropionate (DIPROLENE-AF) 0.05 % ointment     . betamethasone dipropionate (DIPROLENE) 0.05 % cream Apply topically 2 (two) times daily. (Patient taking differently: Apply 1 application topically 2 (two) times daily.) 45 g 0  . budesonide-formoterol (SYMBICORT) 80-4.5 MCG/ACT inhaler Inhale 2 puffs into the lungs 2 (two) times daily. (Patient taking differently: Inhale 2 puffs into the lungs daily as needed (Wheezing).) 1 Inhaler 6  . Continuous Blood Gluc Sensor (DEXCOM G6 SENSOR) MISC Apply new sensor every 10 days for continuous glucose monitoring. 9 each 3  . Continuous Blood Gluc Transmit (DEXCOM G6 TRANSMITTER) MISC Use one transmitter every 3 months for continuous glucose monitoring. 1 each 3  . DULoxetine (CYMBALTA) 60 MG capsule Take 1 capsule (60 mg total) by mouth 2 (two) times daily. 68 capsule 0  . Fluocinolone Acetonide Scalp 0.01 % OIL     . fluocinonide (LIDEX) 0.05 % external solution     . gabapentin (NEURONTIN) 300 MG capsule TAKE 1 CAPSULE(300 MG) BY MOUTH THREE TIMES DAILY (Patient taking differently: Take 300 mg by mouth 3 (three) times daily.) 270 capsule 4  . GVOKE HYPOPEN 1-PACK 1 MG/0.2ML SOAJ Inject under skin 0.2 ml as needed for hypoglycemia 0.2 mL prn  . insulin aspart (NOVOLOG FLEXPEN) 100 UNIT/ML FlexPen INJECT 8-12  UNITS UNDER THE SKIN THREE TIMES DAILY 45 mL 3  . insulin degludec (TRESIBA FLEXTOUCH) 100 UNIT/ML FlexTouch Pen Inject 30 Units into the skin daily. 9 mL 3  . Insulin Pen Needle (B-D UF III MINI PEN NEEDLES) 31G X 5 MM MISC Use with insulin pen 4 times a day to inject insulin 360 each 11  . ketoconazole (NIZORAL) 2 % cream     . metoprolol tartrate (LOPRESSOR) 50 MG tablet     . OTEZLA 30 MG TABS     . pravastatin (PRAVACHOL) 40 MG  tablet Take 1 tablet (40 mg total) by mouth every evening. 90 tablet 3  . solifenacin (VESICARE) 10 MG tablet Take 10 mg by mouth daily.     No current facility-administered medications for this visit.    REVIEW OF SYSTEMS:  [X]  denotes positive finding, [ ]  denotes negative finding Cardiac  Comments:  Chest pain or chest pressure:    Shortness of breath upon exertion:    Short of breath when lying flat:    Irregular heart rhythm:        Vascular    Pain in calf, thigh, or hip brought on by ambulation:    Pain in feet at night that wakes you up from your sleep:     Blood clot in your veins:    Leg swelling:  x       Pulmonary    Oxygen at home:    Productive cough:     Wheezing:         Neurologic    Sudden weakness in arms or legs:     Sudden numbness in arms or legs:     Sudden onset of difficulty speaking or slurred speech:    Temporary loss of vision in one eye:     Problems with dizziness:         Gastrointestinal    Blood in stool:     Vomited blood:         Genitourinary    Burning when urinating:     Blood in urine:        Psychiatric    Major depression:         Hematologic    Bleeding problems:    Problems with blood clotting too easily:        Skin    Rashes or ulcers:        Constitutional    Fever or chills:     PHYSICAL EXAM:   Vitals:   10/22/20 1403  BP: 123/72  Pulse: (!) 110  Resp: 20  Temp: 98.1 F (36.7 C)  TempSrc: Temporal  SpO2: 96%  Weight: 168 lb 12.8 oz (76.6 kg)  Height: 5\' 6"  (1.676 m)     GENERAL: The patient is a well-nourished female, in no acute distress. The vital signs are documented above. CARDIAC: There is a regular rate and rhythm.  VASCULAR: I do not detect carotid bruits. I cannot palpate pedal pulses. On the right side she has a dorsalis pedis and posterior tibial signal with a Doppler which is brisk. On the left side she has a biphasic dorsalis pedis signal but I cannot get a posterior tibial signal She has varicose veins bilaterally and some hyperpigmentation bilaterally. The area of phlebitis has resolved.    PULMONARY: There is good air exchange bilaterally without wheezing or rales. ABDOMEN: Soft and non-tender with normal pitched bowel sounds.  MUSCULOSKELETAL: There are no major deformities or cyanosis. NEUROLOGIC: No focal weakness or paresthesias are detected. SKIN: There are no ulcers or rashes noted. PSYCHIATRIC: The patient has a normal affect.  DATA:    VENOUS DUPLEX: I reviewed her venous duplex scan from 08/24/2020.  She had no evidence of DVT.  She did have superficial venous thrombosis involving the varicosity adjacent to her right knee.  She had deep venous reflux involving the common femoral vein.  She had superficial venous reflux in the great saphenous vein from the saphenofemoral junction to the mid thigh.  In the distal thigh the vein exited the  fascia.  The diameters of the vein in the proximal and mid thigh were 0.59-0.60 cm.  Deitra Mayo [facility] [phone]

## 2020-10-29 DIAGNOSIS — N3941 Urge incontinence: Secondary | ICD-10-CM | POA: Diagnosis not present

## 2020-10-29 DIAGNOSIS — N3020 Other chronic cystitis without hematuria: Secondary | ICD-10-CM | POA: Diagnosis not present

## 2020-10-29 DIAGNOSIS — R351 Nocturia: Secondary | ICD-10-CM | POA: Diagnosis not present

## 2020-11-02 DIAGNOSIS — E1149 Type 2 diabetes mellitus with other diabetic neurological complication: Secondary | ICD-10-CM | POA: Diagnosis not present

## 2020-11-09 ENCOUNTER — Other Ambulatory Visit: Payer: Self-pay | Admitting: Neurology

## 2020-11-14 ENCOUNTER — Other Ambulatory Visit: Payer: Self-pay | Admitting: Internal Medicine

## 2020-11-25 ENCOUNTER — Telehealth: Payer: Self-pay | Admitting: Internal Medicine

## 2020-11-25 DIAGNOSIS — E139 Other specified diabetes mellitus without complications: Secondary | ICD-10-CM

## 2020-11-25 DIAGNOSIS — E1149 Type 2 diabetes mellitus with other diabetic neurological complication: Secondary | ICD-10-CM

## 2020-11-25 DIAGNOSIS — N3020 Other chronic cystitis without hematuria: Secondary | ICD-10-CM | POA: Diagnosis not present

## 2020-11-25 MED ORDER — DEXCOM G6 TRANSMITTER MISC: 0 refills | Status: AC

## 2020-11-25 NOTE — Telephone Encounter (Signed)
Pt called stating she lost her Dexcom transmitter and needs a prescription for a new one. She would like it sent to:  [facility] [address] [place], [place], [place] [address] Phone: [phone]

## 2020-11-25 NOTE — Telephone Encounter (Signed)
Rx sent to preferred pharmacy. Pt notified via MyChart message.

## 2020-12-07 DIAGNOSIS — R351 Nocturia: Secondary | ICD-10-CM | POA: Diagnosis not present

## 2020-12-07 DIAGNOSIS — N3020 Other chronic cystitis without hematuria: Secondary | ICD-10-CM | POA: Diagnosis not present

## 2020-12-08 DIAGNOSIS — L400 Psoriasis vulgaris: Secondary | ICD-10-CM | POA: Diagnosis not present

## 2020-12-24 ENCOUNTER — Other Ambulatory Visit: Payer: Self-pay | Admitting: Internal Medicine

## 2021-01-12 ENCOUNTER — Other Ambulatory Visit: Payer: Self-pay

## 2021-01-12 ENCOUNTER — Encounter: Payer: Self-pay | Admitting: Internal Medicine

## 2021-01-12 ENCOUNTER — Ambulatory Visit: Payer: Medicare PPO | Admitting: Internal Medicine

## 2021-01-12 VITALS — BP 110/80 | HR 105 | Ht 66.0 in | Wt 171.6 lb

## 2021-01-12 DIAGNOSIS — E782 Mixed hyperlipidemia: Secondary | ICD-10-CM

## 2021-01-12 DIAGNOSIS — E1349 Other specified diabetes mellitus with other diabetic neurological complication: Secondary | ICD-10-CM | POA: Diagnosis not present

## 2021-01-12 DIAGNOSIS — E1149 Type 2 diabetes mellitus with other diabetic neurological complication: Secondary | ICD-10-CM

## 2021-01-12 DIAGNOSIS — E139 Other specified diabetes mellitus without complications: Secondary | ICD-10-CM

## 2021-01-12 LAB — POCT GLYCOSYLATED HEMOGLOBIN (HGB A1C): Hemoglobin A1C: 8.3 % — AB (ref 4.0–5.6)

## 2021-01-12 MED ORDER — TRESIBA FLEXTOUCH 100 UNIT/ML ~~LOC~~ SOPN: PEN_INJECTOR | SUBCUTANEOUS | 3 refills | Status: DC

## 2021-01-12 MED ORDER — NOVOLOG FLEXPEN 100 UNIT/ML ~~LOC~~ SOPN: PEN_INJECTOR | SUBCUTANEOUS | 3 refills | Status: DC

## 2021-01-12 NOTE — Patient Instructions (Addendum)
Please  increase: - Tresiba 32 units daily  Change: - NovoLog mealtime dose B - 12 units L - 12 units D - 15 units   You may need 5 units of Novolog before a snack or dessert.              Please do not skip the insulin if sugars are 70-100.              If the sugars are 60-70, take 1/2 of the Humalog dose.             If the sugars are <60, skip insulin with that meal             No Novolog at bedtime.             Please return in 3 months.

## 2021-01-12 NOTE — Progress Notes (Signed)
Patient ID: Megan [name], female   DOB: [birth year], 80 y.o.   MRN: [id number]  This visit occurred during the SARS-CoV-2 public health emergency.  Safety protocols were in place, including screening questions prior to the visit, additional usage of staff PPE, and extensive cleaning of exam room while observing appropriate contact time as indicated for disinfecting solutions.   HPI: Megan [name] is a 80 y.o.-year-old female, presenting for f/u for DM2, dx in 2013, and as LADA in 04/2016, insulin-dependent since 2014, uncontrolled, with complications (DKA, ? Related PN).  Last visit 3 months ago.  Interim history: She feels better at this visit with no complaints today. She felt that her sugars are dropping after breakfast.  Reviewed history:  She also has a history of lung cancer diagnosed 09/2013.  She had chemotherapy and radiation therapy for this.  She has a complicated medical history, with a diagnosis of ulcerative colitis with significant diarrhea that caused dehydration in the past and episodes of DKA. She had 2 admissions for this in 02/26/2016 and 04/14/2016. While in rehabilitation, her CBGs dropped to 30s on her previous dose of insulin, therefore, her long-acting insulin was decreased.   In 03/2020: Her friend found her in a coma>> taken to the hospital >> Glu 1058.  Before this, patient was eating a lot of sugary desserts (which he decided to eliminate since then).  Also, she was not taking the insulin as recommended.  Reviewed HbA1c levels: Lab Results  Component Value Date   HGBA1C 9.3 (A) 08/04/2020   HGBA1C 9.6 (A) 04/09/2020   HGBA1C 9.4 (H) 11/18/2019  12/30/2015: 12% 09/18/2015: 11.3% 06/17/2015: 7.5% 04/08/2015: 10.9% 09/10/2014: 8.5%  11/04/2013: 6.2%  Previously on: - Levemir  >> 12 >> 10 units 1-2x a day (may skip am doses)   - Humalog mealtime dose  - breakfast and dinner   - smaller meal: 15 units     Please do not skip the insulin if  sugars are 70-100.   If the sugars are 60-70, take 1/2 of the Humalog dose.  If the sugars are <60, skip insulin with that meal  No Humalog at bedtime, unless sugars >300: take 5 units or >400: 8 units She had a SSI before, but was not using it. She inquired about an insulin pump in the past, but after we discussed about the complexity of managing this, we decided to continue with insulin pens.  Currently on: Not using all of the recommended changes at last OV: - Tresiba 30 >>  - NovoLog mealtime dose B - 15 units  L - 10 units >> 15 units D - 15 units             Please do not skip the insulin if sugars are 70-100.              If the sugars are 60-70, take 1/2 of the Humalog dose.             If the sugars are <60, skip insulin with that meal             No Novolog at bedtime.  She checks her sugars more than 4 times a day with her Dexcom G6 CGM: CGM parameters: - Average from CGM: 243 +/-77 >> 222 +/-87 >> 201 +/-76 - Percent time CGM active 94.5% >> 98.8% >> 99.4% - Coefficient of variation: 31.7% >> 39.2% >> 37.9%  Time in range:  - very low (<54): 0.0% >> 0% >> 0% -  low (<70): 0.4% >> 0.2% >> 0.9% - normal range (70-180): 21% >> 34.7% >> 44.6 % - high sugars (>180): 78.7% >> 65.1% >> 54.5% - very high sugars (>250): 45.5% >> 38.1% >> 27.2%   Previously:   Previously:   Lowest sugar was 29 >> 45 >> 32 (after lunch) >> 59 >> 55; she has hypoglycemia awareness in the 50s. Highest sugar was 528 >> 350 >> 1058 - but after hospitalization: 429 (at night) >> 350 >> 300s.  Pt's meals are: - Breakfast: Cereal or oatmeal with blueberries or eggs with sausage - Lunch: Sandwich and soup - Dinner: Meat, vegetables, small amount of starch - Snacks: 3-4 a day: Peanuts  -+ Mild CKD, last BUN/creatinine:  Lab Results  Component Value Date   BUN 20 10/09/2020   BUN 17 08/05/2020   CREATININE 0.97 10/09/2020   CREATININE 1.09 (H) 08/05/2020   -+ HL: last set of lipids: Lab  Results  Component Value Date   CHOL 179 10/09/2020   HDL 58.50 10/09/2020   LDLCALC 105 (H) 10/09/2020   LDLDIRECT 156.0 05/10/2019   TRIG 78.0 10/09/2020   CHOLHDL 3 10/09/2020  On pravastatin 40.  - last eye exam was in 04/2020: No DR; Dr. Lady Gary. - + numbness but no tingling in her feet.  She has PN from ChTx and possibly also from diabetes.  Off Lyrica.  ROS: Constitutional: no weight gain/no weight loss, no fatigue, no subjective hyperthermia, no subjective hypothermia Eyes: no blurry vision, no xerophthalmia ENT: no sore throat, no nodules palpated in neck, no dysphagia, no odynophagia, no hoarseness Cardiovascular: no CP/+ SOB/no palpitations/+ leg swelling Respiratory: no cough/+ SOB/+ wheezing Gastrointestinal: no N/no V/no D/no C/no acid reflux Musculoskeletal: no muscle aches/no joint aches Skin: no rashes, no hair loss Neurological: no tremors/no numbness/no tingling/no dizziness  I reviewed pt's medications, allergies, PMH, social hx, family hx, and changes were documented in the history of present illness. Otherwise, unchanged from my initial visit note.  Past Medical History:  Diagnosis Date  . Cancer ([place]) dx'd 09/2013   Lung ca  . Concussion 2018  . Diabetes ([place]) 09/08/2016   type I diabetic patient reported on 09/13/16   . Diabetes mellitus    insulin  . Diabetic neuropathy ([place]) 09/08/2016  . Fall 2018  . GERD (gastroesophageal reflux disease)    no meds for  . History of colitis   . History of hiatal hernia   . Hx of radiation therapy 12/16/13-01/30/14   lung 66Gy  . Hypertension   . Malignant neoplasm of right upper lobe of lung ([place]) 11/28/2013  . Neuropathy   . Psoriasis   . Psoriatic arthritis ([place] [address]) 09/08/2016  . Rheumatoid arthritis ([place])   . Shortness of breath dyspnea    with exertion   Past Surgical History:  Procedure Laterality Date  . ABDOMINAL HYSTERECTOMY    . ABDOMINAL SURGERY    . BALLOON DILATION N/A 12/12/2014   Procedure:  BALLOON DILATION;  Surgeon: Inda Castle, MD;  Location: [facility];  Service: Endoscopy;  Laterality: N/A;  . BIOPSY  01/20/2020   Procedure: BIOPSY;  Surgeon: Doran Stabler, MD;  Location: [facility];  Service: Gastroenterology;;  . CHOLECYSTECTOMY    . COLONOSCOPY N/A 02/07/2016   Procedure: COLONOSCOPY;  Surgeon: Doran Stabler, MD;  Location: [facility];  Service: Endoscopy;  Laterality: N/A;  . DILATION AND CURETTAGE OF UTERUS    . ESOPHAGOGASTRODUODENOSCOPY N/A 12/12/2014   Procedure: ESOPHAGOGASTRODUODENOSCOPY (EGD);  Surgeon: Inda Castle, MD;  Location: [facility];  Service: Endoscopy;  Laterality: N/A;  with dilation  . ESOPHAGOGASTRODUODENOSCOPY (EGD) WITH PROPOFOL N/A 10/09/2014   Procedure: ESOPHAGOGASTRODUODENOSCOPY (EGD) WITH PROPOFOL;  Surgeon: Inda Castle, MD;  Location: [facility];  Service: Endoscopy;  Laterality: N/A;  . ESOPHAGOGASTRODUODENOSCOPY (EGD) WITH PROPOFOL N/A 09/19/2016   Procedure: ESOPHAGOGASTRODUODENOSCOPY (EGD) WITH PROPOFOL;  Surgeon: Doran Stabler, MD;  Location: [facility];  Service: Gastroenterology;  Laterality: N/A;  with savary dil tt  . ESOPHAGOGASTRODUODENOSCOPY (EGD) WITH PROPOFOL N/A 01/20/2020   Procedure: ESOPHAGOGASTRODUODENOSCOPY (EGD) WITH PROPOFOL;  Surgeon: Doran Stabler, MD;  Location: [facility];  Service: Gastroenterology;  Laterality: N/A;  Fluoro-Savary  . ORIF ANKLE FRACTURE Right 07/04/2015   Procedure: OPEN REDUCTION INTERNAL FIXATION (ORIF) ANKLE FRACTURE;  Surgeon: Newt Minion, MD;  Location: [place] [facility];  Service: Orthopedics;  Laterality: Right;  OPEN REDUCTION, INTERNAL FIXATION OF RIGHT ANKLE FRACTURE.   [name] ORIF ANKLE FRACTURE Left 02/10/2016   Procedure: OPEN REDUCTION INTERNAL FIXATION (ORIF) ANKLE FRACTURE;  Surgeon: Newt Minion, MD;  Location: [place];  Service: Orthopedics;  Laterality: Left;  . SAVORY DILATION N/A 10/09/2014   Procedure: SAVORY DILATION;  Surgeon: Inda Castle, MD;  Location:  [place];  Service: Endoscopy;  Laterality: N/A;  . SAVORY DILATION N/A 09/19/2016   Procedure: SAVORY DILATION;  Surgeon: Doran Stabler, MD;  Location: [facility];  Service: Gastroenterology;  Laterality: N/A;  . SAVORY DILATION N/A 01/20/2020   Procedure: SAVORY DILATION;  Surgeon: Doran Stabler, MD;  Location: [facility];  Service: Gastroenterology;  Laterality: N/A;  . TONSILLECTOMY     Social History   Social History  . Marital Status: Divorced    Spouse Name: N/A  . Number of Children: 0   Occupational History  . Retired Pharmacist, hospital Other   Social History Main Topics  . Smoking status: Former Smoker -- 3.00 packs/day for 45 years    Types: Cigarettes    Quit date: 12/05/1997  . Smokeless tobacco: Never Used  . Alcohol Use: No  . Drug Use: No   Social History Narrative   Patient lives at home alone.   Caffeine Use: 2 cups daily   Current Outpatient Medications on File Prior to Visit  Medication Sig Dispense Refill  . albuterol (PROVENTIL HFA;VENTOLIN HFA) 108 (90 Base) MCG/ACT inhaler Inhale 2 puffs into the lungs every 6 (six) hours as needed for wheezing or shortness of breath. 1 Inhaler 2  . amLODipine (NORVASC) 10 MG tablet TAKE 1 TABLET(10 MG) BY MOUTH DAILY 90 tablet 1  . augmented betamethasone dipropionate (DIPROLENE-AF) 0.05 % ointment     . betamethasone dipropionate (DIPROLENE) 0.05 % cream Apply topically 2 (two) times daily. (Patient taking differently: Apply 1 application topically 2 (two) times daily.) 45 g 0  . budesonide-formoterol (SYMBICORT) 80-4.5 MCG/ACT inhaler Inhale 2 puffs into the lungs 2 (two) times daily. (Patient taking differently: Inhale 2 puffs into the lungs daily as needed (Wheezing).) 1 Inhaler 6  . Continuous Blood Gluc Sensor (DEXCOM G6 SENSOR) MISC Apply new sensor every 10 days for continuous glucose monitoring. 9 each 3  . Continuous Blood Gluc Transmit (DEXCOM G6 TRANSMITTER) MISC Use one transmitter every 3 months for  continuous glucose monitoring. 1 each 0  . DULoxetine (CYMBALTA) 60 MG capsule TAKE ONE CAPSULE BY MOUTH TWICE DAILY 180 capsule 3  . Fluocinolone Acetonide Scalp 0.01 % OIL     . fluocinonide (LIDEX) 0.05 %  external solution     . gabapentin (NEURONTIN) 300 MG capsule TAKE 1 CAPSULE(300 MG) BY MOUTH THREE TIMES DAILY (Patient taking differently: Take 300 mg by mouth 3 (three) times daily.) 270 capsule 4  . GVOKE HYPOPEN 1-PACK 1 MG/0.2ML SOAJ Inject under skin 0.2 ml as needed for hypoglycemia 0.2 mL prn  . insulin aspart (NOVOLOG FLEXPEN) 100 UNIT/ML FlexPen INJECT 8-12 UNITS UNDER THE SKIN THREE TIMES DAILY 45 mL 3  . insulin degludec (TRESIBA FLEXTOUCH) 100 UNIT/ML FlexTouch Pen Inject up to 30 units into the skin daily 15 mL 1  . Insulin Pen Needle (B-D UF III MINI PEN NEEDLES) 31G X 5 MM MISC Use with insulin pen 4 times a day to inject insulin 360 each 11  . ketoconazole (NIZORAL) 2 % cream     . metoprolol tartrate (LOPRESSOR) 50 MG tablet     . OTEZLA 30 MG TABS     . pravastatin (PRAVACHOL) 40 MG tablet Take 1 tablet (40 mg total) by mouth every evening. 90 tablet 3  . solifenacin (VESICARE) 10 MG tablet Take 10 mg by mouth daily.     No current facility-administered medications on file prior to visit.   Allergies  Allergen Reactions  . Carboplatin Anaphylaxis, Shortness Of Breath and Other (See Comments)    Neuropathy  . Remicade [Infliximab] Anaphylaxis  . Other   . Rifampin Other (See Comments)    unknown  . Hydromorphone Rash   Family History  Problem Relation Age of Onset  . Other Mother   . Anuerysm Mother        brain  . Other Father        failure to thrive  . Hypertension Other   . Stroke Other   . Heart attack Other   . Hemachromatosis Other        great grandfather  . Rheum arthritis Other        both sets of grandparents and father  . Migraines Neg Hx   . Colon cancer Neg Hx   . Colon polyps Neg Hx   . Esophageal cancer Neg Hx   . Pancreatic cancer  Neg Hx   . Stomach cancer Neg Hx   . Liver disease Neg Hx    PE: BP 110/80 (BP Location: Right Arm, Patient Position: Sitting, Cuff Size: Small)   Pulse (!) 105   Ht 5\' 6"  (1.676 m)   Wt 171 lb 9.6 oz (77.8 kg)   SpO2 96%   BMI 27.70 kg/m  Wt Readings from Last 3 Encounters:  01/12/21 171 lb 9.6 oz (77.8 kg)  10/22/20 168 lb 12.8 oz (76.6 kg)  10/09/20 166 lb (75.3 kg)   Constitutional: normal weight, in NAD Eyes: PERRLA, EOMI, no exophthalmos ENT: moist mucous membranes, no thyromegaly, no cervical lymphadenopathy Cardiovascular: RRR, No MRG, + B LE edema and varicose veins Respiratory: CTA B Gastrointestinal: abdomen soft, NT, ND, BS+ Musculoskeletal: no deformities, strength intact in all 4 Skin: moist, warm, no rashes Neurological: no tremor with outstretched hands, DTR normal in all 4  ASSESSMENT: 1. LADA, insulin-dependent, uncontrolled, with complications - DKA in the setting of dehydration (- PN) - this may be 2/2 ChTx.  Pulmonologist: Dr. Chase Caller Oncologist: Dr. Earlie Server Cardiologist: Dr. Sallyanne Kuster Ophthalmologist: Dr. Clifton James  2.  Peripheral neuropathy -Possibly from Diabetes  PLAN:  1. Patient with longstanding, poorly controlled diabetes, diagnosed as LADA, with very fluctuating CBGs, on basal-bolus insulin regimen.  In 03/2020, she returns after long absence of more  than a year and after being admitted to the hospital with a blood sugar higher than 1000.  At that time she was off her CGM but she restarted it afterwards.  At last visit, her sugars were high overnight, and then increasing after breakfast, after which they were dropping.  At lunchtime, she had the best blood sugars of the day, but even these were occasionally up to 200s.  After lunch, sugars were increasing precipitously and they were remaining high until approximately 9 AM the next morning.  Therefore, at last visit, we discussed about the importance of taking more NovoLog insulin before lunch.   She prefered fixed doses, rather than doses calculated based on insulin to carb ratios.  I advised her to increase this dose from 10 units to 15 units before lunch.  We decreased her Tyler Aas to 36 units and since sugars were lower after breakfast, I also advised her to decrease her NovoLog before this meal.  Latest HbA1c was reviewed and this was high: Lab Results  Component Value Date   HGBA1C 9.3 (A) 08/04/2020  CGM interpretation: -At today's visit, we reviewed her CGM downloads: It appears that 44.6% of values are in target range (goal >70%), while 54.5% are higher than 180 (goal <25%), and 0.9% are lower than 70 (goal <4%).  The calculated average blood sugar is 201.  The projected HbA1c for the next 3 months ([facility]) is 8.1%. -Reviewing the CGM trends, it appears that her sugars drop after 10 AM with a nadir around 2 PM.  They started to increase significantly after dinner around 5-6 PM.  She tells me that she has dessert after this meal or snacks later on.  Sugars remain stably high throughout the night peaking around 9 AM.  We discussed about the importance of stopping sweets and other snacks at night, however, but I did advise her again to cover the snacks with NovoLog, if she continues to eat.  I advised her to use approximately 5 units of NovoLog before a snack.  Due to the very high blood sugars during the night, will increase the Tresiba slightly (she forgot to increase the dose as recommended at last visit) but we will also drop her NovoLog with breakfast and lunch due to the significant drops after 9 AM up until dinnertime. - I suggested to:  Patient Instructions  Please  increase: - Tresiba 32 units daily  Change: - NovoLog mealtime dose B - 12 units L - 12 units D - 15 units   You may need 5 units of Novolog before a snack or dessert.              Please do not skip the insulin if sugars are 70-100.              If the sugars are 60-70, take 1/2 of the Humalog dose.             If  the sugars are <60, skip insulin with that meal             No Novolog at bedtime.             Please return in 3 months.   - we checked her HbA1c: 8.3% (better) - advised to check sugars at different times of the day - 4x a day, rotating check times - advised for yearly eye exams >> she is UTD - return to clinic in 3 months  2. PN  -No pain but she does  have numbness, stable -Previously on Lyrica but came off  3.  Hyperlipidemia -Reviewed latest lipid panel from 09/2020: LDL much improved, but still above goal Lab Results  Component Value Date   CHOL 179 10/09/2020   HDL 58.50 10/09/2020   LDLCALC 105 (H) 10/09/2020   LDLDIRECT 156.0 05/10/2019   TRIG 78.0 10/09/2020   CHOLHDL 3 10/09/2020  -We will continue pravastatin 40 mg daily-no side effects from it  Philemon Kingdom, MD PhD [facility] Endocrinology

## 2021-01-21 DIAGNOSIS — E1149 Type 2 diabetes mellitus with other diabetic neurological complication: Secondary | ICD-10-CM | POA: Diagnosis not present

## 2021-01-25 DIAGNOSIS — R351 Nocturia: Secondary | ICD-10-CM | POA: Diagnosis not present

## 2021-01-25 DIAGNOSIS — N3020 Other chronic cystitis without hematuria: Secondary | ICD-10-CM | POA: Diagnosis not present

## 2021-01-25 DIAGNOSIS — N3941 Urge incontinence: Secondary | ICD-10-CM | POA: Diagnosis not present

## 2021-02-10 ENCOUNTER — Other Ambulatory Visit: Payer: Medicare PPO

## 2021-02-11 ENCOUNTER — Ambulatory Visit (HOSPITAL_COMMUNITY)
Admission: RE | Admit: 2021-02-11 | Discharge: 2021-02-11 | Disposition: A | Discharge status: Home / Self Care | Payer: Medicare PPO | Source: Ambulatory Visit | Attending: Physician Assistant | Admitting: Physician Assistant

## 2021-02-11 ENCOUNTER — Inpatient Hospital Stay: Payer: Medicare PPO | Attending: Internal Medicine

## 2021-02-11 ENCOUNTER — Other Ambulatory Visit: Payer: Self-pay

## 2021-02-11 ENCOUNTER — Encounter (HOSPITAL_COMMUNITY): Payer: Self-pay

## 2021-02-11 DIAGNOSIS — L409 Psoriasis, unspecified: Secondary | ICD-10-CM | POA: Insufficient documentation

## 2021-02-11 DIAGNOSIS — I313 Pericardial effusion (noninflammatory): Secondary | ICD-10-CM | POA: Diagnosis not present

## 2021-02-11 DIAGNOSIS — C3411 Malignant neoplasm of upper lobe, right bronchus or lung: Secondary | ICD-10-CM | POA: Diagnosis not present

## 2021-02-11 DIAGNOSIS — C3490 Malignant neoplasm of unspecified part of unspecified bronchus or lung: Secondary | ICD-10-CM | POA: Diagnosis not present

## 2021-02-11 DIAGNOSIS — Z794 Long term (current) use of insulin: Secondary | ICD-10-CM | POA: Diagnosis not present

## 2021-02-11 DIAGNOSIS — Z923 Personal history of irradiation: Secondary | ICD-10-CM | POA: Insufficient documentation

## 2021-02-11 DIAGNOSIS — E119 Type 2 diabetes mellitus without complications: Secondary | ICD-10-CM | POA: Insufficient documentation

## 2021-02-11 DIAGNOSIS — J9811 Atelectasis: Secondary | ICD-10-CM | POA: Diagnosis not present

## 2021-02-11 DIAGNOSIS — I2510 Atherosclerotic heart disease of native coronary artery without angina pectoris: Secondary | ICD-10-CM | POA: Diagnosis not present

## 2021-02-11 LAB — CBC WITH DIFFERENTIAL (CANCER CENTER ONLY)
Abs Immature Granulocytes: 0.09 10*3/uL — ABNORMAL HIGH (ref 0.00–0.07)
Basophils Absolute: 0.1 10*3/uL (ref 0.0–0.1)
Basophils Relative: 1 %
Eosinophils Absolute: 0.3 10*3/uL (ref 0.0–0.5)
Eosinophils Relative: 4 %
HCT: 44 % (ref 36.0–46.0)
Hemoglobin: 14.3 g/dL (ref 12.0–15.0)
Immature Granulocytes: 1 %
Lymphocytes Relative: 17 %
Lymphs Abs: 1.3 10*3/uL (ref 0.7–4.0)
MCH: 28.5 pg (ref 26.0–34.0)
MCHC: 32.5 g/dL (ref 30.0–36.0)
MCV: 87.8 fL (ref 80.0–100.0)
Monocytes Absolute: 0.4 10*3/uL (ref 0.1–1.0)
Monocytes Relative: 5 %
Neutro Abs: 5.5 10*3/uL (ref 1.7–7.7)
Neutrophils Relative %: 72 %
Platelet Count: 218 10*3/uL (ref 150–400)
RBC: 5.01 MIL/uL (ref 3.87–5.11)
RDW: 14.6 % (ref 11.5–15.5)
WBC Count: 7.7 10*3/uL (ref 4.0–10.5)
nRBC: 0 % (ref 0.0–0.2)

## 2021-02-11 LAB — CMP (CANCER CENTER ONLY)
ALT: 13 U/L (ref 0–44)
AST: 16 U/L (ref 15–41)
Albumin: 3.6 g/dL (ref 3.5–5.0)
Alkaline Phosphatase: 110 U/L (ref 38–126)
Anion gap: 13 (ref 5–15)
BUN: 13 mg/dL (ref 8–23)
CO2: 28 mmol/L (ref 22–32)
Calcium: 8.8 mg/dL — ABNORMAL LOW (ref 8.9–10.3)
Chloride: 100 mmol/L (ref 98–111)
Creatinine: 1.12 mg/dL — ABNORMAL HIGH (ref 0.44–1.00)
GFR, Estimated: 50 mL/min — ABNORMAL LOW (ref >60)
Glucose, Bld: 324 mg/dL — ABNORMAL HIGH (ref 70–99)
Potassium: 3.9 mmol/L (ref 3.5–5.1)
Sodium: 141 mmol/L (ref 135–145)
Total Bilirubin: 0.6 mg/dL (ref 0.3–1.2)
Total Protein: 6.9 g/dL (ref 6.5–8.1)

## 2021-02-11 MED ORDER — IOHEXOL 300 MG/ML  SOLN
75.0000 mL | Freq: Once | INTRAMUSCULAR | Status: AC | PRN
  Administered 2021-02-11: 75 mL via INTRAVENOUS

## 2021-02-15 ENCOUNTER — Other Ambulatory Visit: Payer: Self-pay

## 2021-02-15 ENCOUNTER — Inpatient Hospital Stay: Payer: Medicare PPO | Admitting: Internal Medicine

## 2021-02-15 ENCOUNTER — Telehealth: Payer: Self-pay | Admitting: Internal Medicine

## 2021-02-15 VITALS — BP 131/72 | HR 102 | Temp 97.6°F | Resp 20 | Ht 66.0 in | Wt 171.9 lb

## 2021-02-15 DIAGNOSIS — Z923 Personal history of irradiation: Secondary | ICD-10-CM | POA: Diagnosis not present

## 2021-02-15 DIAGNOSIS — E119 Type 2 diabetes mellitus without complications: Secondary | ICD-10-CM | POA: Diagnosis not present

## 2021-02-15 DIAGNOSIS — C3490 Malignant neoplasm of unspecified part of unspecified bronchus or lung: Secondary | ICD-10-CM | POA: Diagnosis not present

## 2021-02-15 DIAGNOSIS — Z794 Long term (current) use of insulin: Secondary | ICD-10-CM | POA: Diagnosis not present

## 2021-02-15 DIAGNOSIS — C3411 Malignant neoplasm of upper lobe, right bronchus or lung: Secondary | ICD-10-CM | POA: Diagnosis not present

## 2021-02-15 DIAGNOSIS — L409 Psoriasis, unspecified: Secondary | ICD-10-CM | POA: Diagnosis not present

## 2021-02-15 NOTE — Progress Notes (Signed)
[facility] [phone]) [phone]   [phone]  OFFICE PROGRESS NOTE  Binnie Rail, MD [place] [address]  DIAGNOSIS: Squamous cell lung cancer  Primary site: Lung (Right)  Staging method: AJCC 7th Edition  Clinical: Stage IIIB (T3, N3, M0)  Summary: Stage IIIB (T3, N3, M0) diagnosed in January 2015.  PRIOR THERAPY:  1) Concurrent chemoradiation with weekly chemotherapy in the form of carboplatin for AUC of 2 and paclitaxel 45 mg/m2. Status post 7 week of treatment. Last dose was given 01/27/2014 no significant response in her disease.  2) Consolidation chemotherapy with carboplatin for AUC of 5 and paclitaxel 175 mg/M2 every 3 weeks with Neulasta support. First dose on 04/15/2014. Status post 3 cycles. Carboplatin was discontinued starting cycle #2 secondary to hypersensitivity reaction. 3) Immunotherapy with Opdivo (Nivolumab) 3mg /kg given every 2 weeks. Status post 33 cycles discontinued secondary to intolerance.  CURRENT THERAPY: Observation.  INTERVAL HISTORY: Megan [name] 80 y.o. female returns to the clinic today for 60-month follow-up visit.  The patient is feeling fine today with no concerning complaints.  She moved back to [place] and currently lives in [place] after selling her house in [place].  She denied having any current chest pain, shortness of breath, cough or hemoptysis.  She denied having any fever or chills.  She has no nausea, vomiting, diarrhea or constipation.  She has no headache or visual changes.  She was recently diagnosed with psoriasis and currently on [place].  The patient had repeat CT scan of the chest performed recently and she is here for evaluation and discussion of her scan results.  MEDICAL HISTORY: Past Medical History:  Diagnosis Date  . Cancer ([place]) dx'd 09/2013   Lung ca  . Concussion 2018  . Diabetes ([place]) 09/08/2016   type I diabetic patient reported on 09/13/16   .  Diabetes mellitus    insulin  . Diabetic neuropathy ([place]) 09/08/2016  . Fall 2018  . GERD (gastroesophageal reflux disease)    no meds for  . History of colitis   . History of hiatal hernia   . Hx of radiation therapy 12/16/13-01/30/14   lung 66Gy  . Hypertension   . Malignant neoplasm of right upper lobe of lung ([place]) 11/28/2013  . Neuropathy   . Psoriasis   . Psoriatic arthritis ([place]) 09/08/2016  . Rheumatoid arthritis ([place])   . Shortness of breath dyspnea    with exertion    ALLERGIES:  is allergic to carboplatin, remicade [infliximab], other, rifampin, and hydromorphone.  MEDICATIONS:  Current Outpatient Medications  Medication Sig Dispense Refill  . albuterol (PROVENTIL HFA;VENTOLIN HFA) 108 (90 Base) MCG/ACT inhaler Inhale 2 puffs into the lungs every 6 (six) hours as needed for wheezing or shortness of breath. 1 Inhaler 2  . amLODipine (NORVASC) 10 MG tablet TAKE 1 TABLET(10 MG) BY MOUTH DAILY 90 tablet 1  . augmented betamethasone dipropionate (DIPROLENE-AF) 0.05 % ointment     . betamethasone dipropionate (DIPROLENE) 0.05 % cream Apply topically 2 (two) times daily. (Patient taking differently: Apply 1 application topically 2 (two) times daily.) 45 g 0  . budesonide-formoterol (SYMBICORT) 80-4.5 MCG/ACT inhaler Inhale 2 puffs into the lungs 2 (two) times daily. (Patient taking differently: Inhale 2 puffs into the lungs daily as needed (Wheezing).) 1 Inhaler 6  . Continuous Blood Gluc Sensor (DEXCOM G6 SENSOR) MISC Apply new sensor every 10 days for continuous glucose monitoring. 9 each 3  . Continuous  Blood Gluc Transmit (DEXCOM G6 TRANSMITTER) MISC Use one transmitter every 3 months for continuous glucose monitoring. 1 each 0  . DULoxetine (CYMBALTA) 60 MG capsule TAKE ONE CAPSULE BY MOUTH TWICE DAILY 180 capsule 3  . Fluocinolone Acetonide Scalp 0.01 % OIL     . fluocinonide (LIDEX) 0.05 % external solution     . gabapentin (NEURONTIN) 300 MG capsule TAKE 1 CAPSULE(300 MG) BY  MOUTH THREE TIMES DAILY (Patient taking differently: Take 300 mg by mouth 3 (three) times daily.) 270 capsule 4  . GVOKE HYPOPEN 1-PACK 1 MG/0.2ML SOAJ Inject under skin 0.2 ml as needed for hypoglycemia 0.2 mL prn  . insulin aspart (NOVOLOG FLEXPEN) 100 UNIT/ML FlexPen INJECT 12-18 UNITS UNDER THE SKIN THREE TIMES DAILY 45 mL 3  . insulin degludec (TRESIBA FLEXTOUCH) 100 UNIT/ML FlexTouch Pen Inject up to 32 units into the skin daily 30 mL 3  . Insulin Pen Needle (B-D UF III MINI PEN NEEDLES) 31G X 5 MM MISC Use with insulin pen 4 times a day to inject insulin 360 each 11  . ketoconazole (NIZORAL) 2 % cream     . metoprolol tartrate (LOPRESSOR) 50 MG tablet     . OTEZLA 30 MG TABS     . pravastatin (PRAVACHOL) 40 MG tablet Take 1 tablet (40 mg total) by mouth every evening. 90 tablet 3  . solifenacin (VESICARE) 10 MG tablet Take 10 mg by mouth daily.     No current facility-administered medications for this visit.    SURGICAL HISTORY:  Past Surgical History:  Procedure Laterality Date  . ABDOMINAL HYSTERECTOMY    . ABDOMINAL SURGERY    . BALLOON DILATION N/A 12/12/2014   Procedure: BALLOON DILATION;  Surgeon: Inda Castle, MD;  Location: [facility];  Service: Endoscopy;  Laterality: N/A;  . BIOPSY  01/20/2020   Procedure: BIOPSY;  Surgeon: Doran Stabler, MD;  Location: [facility];  Service: Gastroenterology;;  . CHOLECYSTECTOMY    . COLONOSCOPY N/A 02/07/2016   Procedure: COLONOSCOPY;  Surgeon: Doran Stabler, MD;  Location: [facility];  Service: Endoscopy;  Laterality: N/A;  . DILATION AND CURETTAGE OF UTERUS    . ESOPHAGOGASTRODUODENOSCOPY N/A 12/12/2014   Procedure: ESOPHAGOGASTRODUODENOSCOPY (EGD);  Surgeon: Inda Castle, MD;  Location: [facility];  Service: Endoscopy;  Laterality: N/A;  with dilation  . ESOPHAGOGASTRODUODENOSCOPY (EGD) WITH PROPOFOL N/A 10/09/2014   Procedure: ESOPHAGOGASTRODUODENOSCOPY (EGD) WITH PROPOFOL;  Surgeon: Inda Castle, MD;   Location: [facility];  Service: Endoscopy;  Laterality: N/A;  . ESOPHAGOGASTRODUODENOSCOPY (EGD) WITH PROPOFOL N/A 09/19/2016   Procedure: ESOPHAGOGASTRODUODENOSCOPY (EGD) WITH PROPOFOL;  Surgeon: Doran Stabler, MD;  Location: [facility];  Service: Gastroenterology;  Laterality: N/A;  with savary dil tt  . ESOPHAGOGASTRODUODENOSCOPY (EGD) WITH PROPOFOL N/A 01/20/2020   Procedure: ESOPHAGOGASTRODUODENOSCOPY (EGD) WITH PROPOFOL;  Surgeon: Doran Stabler, MD;  Location: [facility];  Service: Gastroenterology;  Laterality: N/A;  Fluoro-Savary  . ORIF ANKLE FRACTURE Right 07/04/2015   Procedure: OPEN REDUCTION INTERNAL FIXATION (ORIF) ANKLE FRACTURE;  Surgeon: Newt Minion, MD;  Location: [facility];  Service: Orthopedics;  Laterality: Right;  OPEN REDUCTION, INTERNAL FIXATION OF RIGHT ANKLE FRACTURE.   [name] ORIF ANKLE FRACTURE Left 02/10/2016   Procedure: OPEN REDUCTION INTERNAL FIXATION (ORIF) ANKLE FRACTURE;  Surgeon: Newt Minion, MD;  Location: [facility];  Service: Orthopedics;  Laterality: Left;  . SAVORY DILATION N/A 10/09/2014   Procedure: SAVORY DILATION;  Surgeon: Inda Castle, MD;  Location:  [facility];  Service: Endoscopy;  Laterality: N/A;  . SAVORY DILATION N/A 09/19/2016   Procedure: SAVORY DILATION;  Surgeon: Doran Stabler, MD;  Location: [facility];  Service: Gastroenterology;  Laterality: N/A;  . SAVORY DILATION N/A 01/20/2020   Procedure: SAVORY DILATION;  Surgeon: Doran Stabler, MD;  Location: [facility];  Service: Gastroenterology;  Laterality: N/A;  . TONSILLECTOMY      REVIEW OF SYSTEMS:  A comprehensive review of systems was negative.   PHYSICAL EXAMINATION: General appearance: alert, cooperative and no distress Head: Normocephalic, without obvious abnormality, atraumatic Neck: no adenopathy, no JVD, supple, symmetrical, trachea midline and thyroid not enlarged, symmetric, no tenderness/mass/nodules Lymph nodes: Cervical, supraclavicular, and axillary nodes  normal. Resp: clear to auscultation bilaterally Back: symmetric, no curvature. ROM normal. No CVA tenderness. Cardio: regular rate and rhythm, S1, S2 normal, no murmur, click, rub or gallop GI: soft, non-tender; bowel sounds normal; no masses,  no organomegaly Extremities: extremities normal, atraumatic, no cyanosis or edema    ECOG PERFORMANCE STATUS: 1 - Symptomatic but completely ambulatory  Blood pressure 131/72, pulse (!) 102, temperature 97.6 F (36.4 C), temperature source Tympanic, resp. rate 20, height 5\' 6"  (1.676 m), weight 171 lb 14.4 oz (78 kg), SpO2 98 %.  LABORATORY DATA: Lab Results  Component Value Date   WBC 7.7 02/11/2021   HGB 14.3 02/11/2021   HCT 44.0 02/11/2021   MCV 87.8 02/11/2021   PLT 218 02/11/2021      Chemistry      Component Value Date/Time   NA 141 02/11/2021 0934   NA 139 07/10/2019 0922   NA 141 09/08/2017 1213   K 3.9 02/11/2021 0934   K 4.1 09/08/2017 1213   CL 100 02/11/2021 0934   CO2 28 02/11/2021 0934   CO2 28 09/08/2017 1213   BUN 13 02/11/2021 0934   BUN 18 07/10/2019 0922   BUN 13.8 09/08/2017 1213   CREATININE 1.12 (H) 02/11/2021 0934   CREATININE 0.9 09/08/2017 1213   GLU 176 03/07/2016 0000      Component Value Date/Time   CALCIUM 8.8 (L) 02/11/2021 0934   CALCIUM 9.4 09/08/2017 1213   ALKPHOS 110 02/11/2021 0934   ALKPHOS 182 (H) 09/08/2017 1213   AST 16 02/11/2021 0934   AST 15 09/08/2017 1213   ALT 13 02/11/2021 0934   ALT 17 09/08/2017 1213   BILITOT 0.6 02/11/2021 0934   BILITOT 0.45 09/08/2017 1213       RADIOGRAPHIC STUDIES: CT Chest W Contrast  Result Date: 02/12/2021 CLINICAL DATA:  Metastatic non-small cell lung cancer diagnosed in 2015. Chemotherapy and radiation therapy completed. Assess treatment response. EXAM: CT CHEST WITH CONTRAST TECHNIQUE: Multidetector CT imaging of the chest was performed during intravenous contrast administration. CONTRAST:  58mL OMNIPAQUE IOHEXOL 300 MG/ML  SOLN COMPARISON:   CT chest 08/05/2020 and 02/03/2020. FINDINGS: Cardiovascular: No acute vascular findings. Mild aortic and coronary artery atherosclerosis. There is a stable small pericardial effusion. The heart size is normal. Mediastinum/Nodes: There are no enlarged mediastinal, hilar or axillary lymph nodes.Stable calcified left hilar lymph nodes. There is stable increased density throughout the central mediastinal fat associated with wall thickening of the midesophagus. The thyroid gland and trachea demonstrate no significant findings. Lungs/Pleura: No pleural effusion or pneumothorax. Chronic collapse of the right middle and upper lobes with associated volume loss, similar to previous studies. Stable chronic perihilar opacity and architectural distortion in the right lower lobe. Small calcified granulomas are stable. There are no new  or enlarging pulmonary nodules. Upper abdomen: The visualized upper abdomen appears stable without significant findings. There is a stable cyst in the left hepatic lobe and calcified splenic granulomas. No adrenal mass. Musculoskeletal/Chest wall: There is no chest wall mass or suspicious osseous finding. IMPRESSION: 1. Stable chest CT without local recurrence or evidence of metastatic disease. 2. Stable chronic right upper and middle lobe collapse with architectural distortion in the right perihilar region. 3. Stable probable radiation changes in the central mediastinum and mid esophagus. 4. Sequela of prior granulomatous disease. 5. Coronary and Aortic Atherosclerosis (ICD10-I70.0). Electronically Signed   By: Richardean Sale M.D.   On: 02/12/2021 15:32    ASSESSMENT AND PLAN:  This is a very pleasant 80 years old white female with metastatic non-small cell lung cancer status post a course of concurrent chemoradiation as well as consolidation chemotherapy discontinued secondary to intolerance and significant neuropathy. The patient was treated with immunotherapy with Nivolumab status post 33  cycles and tolerated the treatment well but it was discontinued secondary to chemotherapy-induced colitis and diarrhea. The patient has been in observation since completion of her treatment with immunotherapy.  She has no concerning complaints. She had repeat CT scan of the chest performed recently.  I personally and independently reviewed the scans and discussed the results with the patient today. Her scan showed no concerning findings for disease recurrence or progression. I recommended for her to continue on observation with repeat CT scan of the chest in 1 year. For the diabetes mellitus, she was advised to take her medication as prescribed and to monitor it closely with her primary care physician. She was advised to call immediately if she has any concerning symptoms in the interval. The patient voices understanding of current disease status and treatment options and is in agreement with the current care plan. All questions were answered. The patient knows to call the clinic with any problems, questions or concerns. We can certainly see the patient much sooner if necessary.  Disclaimer: This note was dictated with voice recognition software. Similar sounding words can inadvertently be transcribed and may not be corrected upon review.

## 2021-02-15 NOTE — Telephone Encounter (Signed)
Scheduled follow-up appointments per 4/18 los. Patient is aware.

## 2021-03-01 ENCOUNTER — Other Ambulatory Visit: Payer: Self-pay | Admitting: Internal Medicine

## 2021-03-03 DIAGNOSIS — N3941 Urge incontinence: Secondary | ICD-10-CM | POA: Diagnosis not present

## 2021-03-03 DIAGNOSIS — R351 Nocturia: Secondary | ICD-10-CM | POA: Diagnosis not present

## 2021-03-03 DIAGNOSIS — N183 Chronic kidney disease, stage 3 unspecified: Secondary | ICD-10-CM | POA: Diagnosis not present

## 2021-03-03 DIAGNOSIS — N3020 Other chronic cystitis without hematuria: Secondary | ICD-10-CM | POA: Diagnosis not present

## 2021-03-31 ENCOUNTER — Telehealth: Payer: Self-pay | Admitting: Internal Medicine

## 2021-03-31 NOTE — Telephone Encounter (Signed)
Type of form received: Medical Clearance   Additional comments: Cataract Surgery  Received by: Mail   Form should be Faxed to: [phone]  Form should be mailed to: [facility]  Jericho Dunmor  Is patient requesting call for pickup: N   Form placed in the Provider's box.  *Attach charge sheet.  Provider will determine charge.*  Was patient informed of  7-10 business day turn around (Y/N)? N

## 2021-04-01 NOTE — Telephone Encounter (Signed)
Given to Dr. Quay Burow.  Awaiting signature.

## 2021-04-02 NOTE — Telephone Encounter (Signed)
Paperwork signed yesterday.  Attempted to fax paperwork but fax machine busy.  Will try to refax today.

## 2021-04-13 NOTE — Telephone Encounter (Signed)
Patient provided envelop with paperwork to mail to [facility].    After attempting to fax paperwork several times with no response paperwork placed in mail today to go to [facility] for her appointment next month.  Copy of paperwork kept.

## 2021-04-30 ENCOUNTER — Other Ambulatory Visit: Payer: Self-pay | Admitting: Internal Medicine

## 2021-05-17 DIAGNOSIS — E119 Type 2 diabetes mellitus without complications: Secondary | ICD-10-CM | POA: Diagnosis not present

## 2021-05-17 DIAGNOSIS — H5213 Myopia, bilateral: Secondary | ICD-10-CM | POA: Diagnosis not present

## 2021-05-17 DIAGNOSIS — H2513 Age-related nuclear cataract, bilateral: Secondary | ICD-10-CM | POA: Diagnosis not present

## 2021-05-18 ENCOUNTER — Telehealth: Payer: Self-pay | Admitting: Internal Medicine

## 2021-05-18 NOTE — Telephone Encounter (Signed)
Pt called to let us know [facility] has faxed [place] the request for Dexcom sensors 3 pack. She wanted to let us know she is completely out so is requesting a refill. Please send the refill to [place].

## 2021-05-20 NOTE — Telephone Encounter (Signed)
Patient called to check status of fax from [place] - provided return fax # [phone].  Was advised again of 7/10 business day turn around

## 2021-05-21 NOTE — Telephone Encounter (Signed)
Pt's paperwork completed and faxed.

## 2021-05-25 ENCOUNTER — Telehealth: Payer: Self-pay | Admitting: Dietician

## 2021-05-25 NOTE — Telephone Encounter (Signed)
Returned patient call. Patient states that she is out of her Dexom Sensors. Informed patient that we do not have additional sensors. Informed patient that her MD's medical assistant has sent the prescription for the sensors and this will take a 7-10 day turn around. Patient is currently using finger stick to monitor her blood glucose. She has no further questions at this time.  Antonieta Iba, RD, LDN, CDCES

## 2021-05-31 DIAGNOSIS — E1149 Type 2 diabetes mellitus with other diabetic neurological complication: Secondary | ICD-10-CM | POA: Diagnosis not present

## 2021-06-01 ENCOUNTER — Telehealth: Payer: Self-pay | Admitting: *Deleted

## 2021-06-01 DIAGNOSIS — R351 Nocturia: Secondary | ICD-10-CM | POA: Diagnosis not present

## 2021-06-01 DIAGNOSIS — N3941 Urge incontinence: Secondary | ICD-10-CM | POA: Diagnosis not present

## 2021-06-01 DIAGNOSIS — N3020 Other chronic cystitis without hematuria: Secondary | ICD-10-CM | POA: Diagnosis not present

## 2021-06-01 NOTE — Chronic Care Management (AMB) (Signed)
  Chronic Care Management   Outreach Note  06/01/2021 Name: [name] MRN: [id number] DOB: 06/20/1941  Megan [name] is a 80 y.o. year old female who is a primary care patient of Burns, [name], MD. I reached out to Megan [name] by phone today in response to a referral sent by [name]'s PCP, Dr. Quay Burow.      An unsuccessful telephone outreach was attempted today. The patient was referred to the case management team for assistance with care management and care coordination.   Follow Up Plan: A HIPAA compliant phone message was left for the patient providing contact information and requesting a return call. The care management team will reach out to the patient again over the next 7-15 days.  If patient returns call to provider office, please advise to call [place] at [phone].  [facility] Management  Direct Dial: [phone]

## 2021-06-06 ENCOUNTER — Other Ambulatory Visit: Payer: Self-pay | Admitting: Internal Medicine

## 2021-06-09 DIAGNOSIS — L400 Psoriasis vulgaris: Secondary | ICD-10-CM | POA: Diagnosis not present

## 2021-06-10 DIAGNOSIS — H25811 Combined forms of age-related cataract, right eye: Secondary | ICD-10-CM | POA: Diagnosis not present

## 2021-06-10 DIAGNOSIS — H2511 Age-related nuclear cataract, right eye: Secondary | ICD-10-CM | POA: Diagnosis not present

## 2021-06-14 ENCOUNTER — Other Ambulatory Visit: Payer: Self-pay | Admitting: Internal Medicine

## 2021-06-16 NOTE — Chronic Care Management (AMB) (Signed)
  Chronic Care Management   Note  06/16/2021 Name: Megan [name] MRN: [id number] DOB: 10-18-1941  Megan [name] is a 80 y.o. year old female who is a primary care patient of Burns, [name], MD. I reached out to Megan [name] by phone today in response to a referral sent by [name]'s PCP, Dr. Quay Burow.      [name] was given information about Chronic Care Management services today including:  CCM service includes personalized support from designated clinical staff supervised by her physician, including individualized plan of care and coordination with other care providers 24/7 contact phone numbers for assistance for urgent and routine care needs. Service will only be billed when office clinical staff spend 20 minutes or more in a month to coordinate care. Only one practitioner may furnish and bill the service in a calendar month. The patient may stop CCM services at any time (effective at the end of the month) by phone call to the office staff. The patient will be responsible for cost sharing (co-pay) of up to 20% of the service fee (after annual deductible is met).  Patient agreed to services and verbal consent obtained.   Follow up plan: Telephone appointment with care management team member scheduled for:06/29/21  [facility] Management  Direct Dial: [phone]

## 2021-06-29 ENCOUNTER — Telehealth: Payer: Medicare PPO

## 2021-06-29 ENCOUNTER — Telehealth: Payer: Self-pay | Admitting: *Deleted

## 2021-06-29 ENCOUNTER — Encounter: Payer: Self-pay | Admitting: *Deleted

## 2021-06-29 NOTE — Telephone Encounter (Signed)
  Chronic Care Management   Follow Up Note   06/29/2021 Name: Megan [name] MRN: [id number] DOB: [birth year]  Referred by: Binnie Rail, MD Reason for referral : Chronic Care Management (CCM RN CM Initial Outreach attempt- Unsuccessful Outreach)  An unsuccessful telephone outreach was attempted today. The patient was referred to the case management team for assistance with care management and care coordination.   Follow Up Plan:  A HIPPA compliant phone message was left for the patient providing contact information and requesting a return call- patient did not return phone call Will place request with scheduling care guide to contact patient to re-schedule today's missed CCM RN initial telephone appointment  Oneta Rack, RN, BSN, [place] [phone]: direct office [phone]: mobile

## 2021-07-07 ENCOUNTER — Other Ambulatory Visit: Payer: Self-pay | Admitting: Neurology

## 2021-07-13 ENCOUNTER — Ambulatory Visit: Payer: Medicare PPO | Admitting: Internal Medicine

## 2021-07-13 ENCOUNTER — Encounter: Payer: Self-pay | Admitting: Internal Medicine

## 2021-07-13 ENCOUNTER — Other Ambulatory Visit: Payer: Self-pay

## 2021-07-13 VITALS — BP 110/68 | HR 107 | Ht 66.0 in | Wt 167.4 lb

## 2021-07-13 DIAGNOSIS — E1349 Other specified diabetes mellitus with other diabetic neurological complication: Secondary | ICD-10-CM | POA: Diagnosis not present

## 2021-07-13 DIAGNOSIS — E782 Mixed hyperlipidemia: Secondary | ICD-10-CM

## 2021-07-13 DIAGNOSIS — E1149 Type 2 diabetes mellitus with other diabetic neurological complication: Secondary | ICD-10-CM

## 2021-07-13 DIAGNOSIS — E139 Other specified diabetes mellitus without complications: Secondary | ICD-10-CM

## 2021-07-13 LAB — POCT GLYCOSYLATED HEMOGLOBIN (HGB A1C): Hemoglobin A1C: 8.9 % — AB (ref 4.0–5.6)

## 2021-07-13 MED ORDER — GVOKE HYPOPEN 1-PACK 1 MG/0.2ML ~~LOC~~ SOAJ: SUBCUTANEOUS | 99 refills | Status: DC

## 2021-07-13 NOTE — Progress Notes (Signed)
Patient ID: Megan [name], female   DOB: 13-Feb-1941, 80 y.o.   MRN: [id number]  This visit occurred during the SARS-CoV-2 public health emergency.  Safety protocols were in place, including screening questions prior to the visit, additional usage of staff PPE, and extensive cleaning of exam room while observing appropriate contact time as indicated for disinfecting solutions.   HPI: Megan [name] is a 80 y.o.-year-old female, presenting for f/u for DM2, dx in 2013, and as LADA in 04/2016, insulin-dependent since 2014, uncontrolled, with complications (DKA, ? Related PN).  Last visit 6 months ago.  Interim history: No increased urination, blurry vision, nausea, chest pain. She has pruritus from her psoriasis.  She started [place] and pruritus improved. Since last visit, she sold her house in [place] and moved to [facility] assisted living facility.  She likes her meals there and she feels that her sugars worsened after she moved.  Reviewed history:  She also has a history of lung cancer diagnosed 09/2013.  She had chemotherapy and radiation therapy for this.  She has a complicated medical history, with a diagnosis of ulcerative colitis with significant diarrhea that caused dehydration in the past and episodes of DKA. She had 2 admissions for this in 02/26/2016 and 04/14/2016. While in rehabilitation, her CBGs dropped to 30s on her previous dose of insulin, therefore, her long-acting insulin was decreased.   In 03/2020: Her friend found her in a coma>> taken to the hospital >> Glu 1058.  Before this, patient was eating a lot of sugary desserts (which he decided to eliminate since then).  Also, she was not taking the insulin as recommended.  Reviewed HbA1c levels: Lab Results  Component Value Date   HGBA1C 8.3 (A) 01/12/2021   HGBA1C 9.3 (A) 08/04/2020   HGBA1C 9.6 (A) 04/09/2020  12/30/2015: 12% 09/18/2015: 11.3% 06/17/2015: 7.5% 04/08/2015: 10.9% 09/10/2014: 8.5%   11/04/2013: 6.2%  Previously on: - Levemir  >> 12 >> 10 units 1-2x a day (may skip am doses)   - Humalog mealtime dose  - breakfast and dinner   - smaller meal: 15 units   Please do not skip the insulin if sugars are 70-100.   If the sugars are 60-70, take 1/2 of the Humalog dose.  If the sugars are <60, skip insulin with that meal  No Humalog at bedtime, unless sugars >300: take 5 units or >400: 8 units She had a SSI before, but was not using it. She inquired about an insulin pump in the past, but after we discussed about the complexity of managing this, we decided to continue with insulin pens.  Currently on: - Tresiba 32 units daily - NovoLog mealtime dose B - 12 >> 15 units L - 12 >> 15 units D - 15 units  You may need 5 units of Novolog before a snack or dessert.              Please do not skip the insulin if sugars are 70-100.              If the sugars are 60-70, take 1/2 of the Humalog dose.             If the sugars are <60, skip insulin with that meal             No Novolog at bedtime.  She checks her sugars more than 4 times a day with her Dexcom G6 CGM- from [facility]:   Previously:   Previously:  Lowest sugar was 29 >> .[name].32 (after lunch) >> 59 >> 55 >> 70s; she has hypoglycemia awareness in the 50s. Highest sugar was 1058 >> 429 (at night) >> 350 >> 300s >>  >400.  Pt's meals are: - Breakfast: Cereal or oatmeal with blueberries or eggs with sausage - Lunch: Sandwich and soup - Dinner: Meat, vegetables, small amount of starch - Snacks: 3-4 a day: Peanuts  -+ Mild CKD, last BUN/creatinine:  Lab Results  Component Value Date   BUN 13 02/11/2021   BUN 20 10/09/2020   CREATININE 1.12 (H) 02/11/2021   CREATININE 0.97 10/09/2020   -+ HL: last set of lipids: Lab Results  Component Value Date   CHOL 179 10/09/2020   HDL 58.50 10/09/2020   LDLCALC 105 (H) 10/09/2020   LDLDIRECT 156.0 05/10/2019   TRIG 78.0 10/09/2020   CHOLHDL 3 10/09/2020  On  pravastatin 40.  - last eye exam was in 04/2020: No DR; Dr. Lady Gary.  - + numbness but no tingling in her feet.  She has PN from ChTx and possibly also from diabetes.  Off Lyrica.  ROS: Constitutional: no weight gain/no weight loss, no fatigue, no subjective hyperthermia, no subjective hypothermia Eyes: no blurry vision, no xerophthalmia ENT: no sore throat, no nodules palpated in neck, no dysphagia, no odynophagia, no hoarseness Cardiovascular: no CP/+ SOB/no palpitations/+ leg swelling Respiratory: no cough/+ SOB/+ wheezing Gastrointestinal: no N/no V/no D/no C/no acid reflux Musculoskeletal: no muscle aches/no joint aches Skin: + rash, no hair loss Neurological: no tremors/no numbness/no tingling/no dizziness  I reviewed pt's medications, allergies, PMH, social hx, family hx, and changes were documented in the history of present illness. Otherwise, unchanged from my initial visit note.  Past Medical History:  Diagnosis Date   Cancer ([place]) dx'd 09/2013   Lung ca   Concussion 2018   Diabetes ([place]) 09/08/2016   type I diabetic patient reported on 09/13/16    Diabetes mellitus    insulin   Diabetic neuropathy ([place]) 09/08/2016   Fall 2018   GERD (gastroesophageal reflux disease)    no meds for   History of colitis    History of hiatal hernia    Hx of radiation therapy 12/16/13-01/30/14   lung 66Gy   Hypertension    Malignant neoplasm of right upper lobe of lung ([place]) 11/28/2013   Neuropathy    Psoriasis    Psoriatic arthritis ([place]) 09/08/2016   Rheumatoid arthritis ([place])    Shortness of breath dyspnea    with exertion   Past Surgical History:  Procedure Laterality Date   ABDOMINAL HYSTERECTOMY     ABDOMINAL SURGERY     BALLOON DILATION N/A 12/12/2014   Procedure: BALLOON DILATION;  Surgeon: Inda Castle, MD;  Location: [facility];  Service: Endoscopy;  Laterality: N/A;   BIOPSY  01/20/2020   Procedure: BIOPSY;  Surgeon: Doran Stabler, MD;  Location: [facility];   Service: Gastroenterology;;   CHOLECYSTECTOMY     COLONOSCOPY N/A 02/07/2016   Procedure: COLONOSCOPY;  Surgeon: Doran Stabler, MD;  Location: [facility];  Service: Endoscopy;  Laterality: N/A;   DILATION AND CURETTAGE OF UTERUS     ESOPHAGOGASTRODUODENOSCOPY N/A 12/12/2014   Procedure: ESOPHAGOGASTRODUODENOSCOPY (EGD);  Surgeon: Inda Castle, MD;  Location: [facility];  Service: Endoscopy;  Laterality: N/A;  with dilation   ESOPHAGOGASTRODUODENOSCOPY (EGD) WITH PROPOFOL N/A 10/09/2014   Procedure: ESOPHAGOGASTRODUODENOSCOPY (EGD) WITH PROPOFOL;  Surgeon: Inda Castle, MD;  Location: [facility];  Service: Endoscopy;  Laterality: N/A;   ESOPHAGOGASTRODUODENOSCOPY (EGD) WITH PROPOFOL N/A 09/19/2016   Procedure: ESOPHAGOGASTRODUODENOSCOPY (EGD) WITH PROPOFOL;  Surgeon: Doran Stabler, MD;  [facility]: [facility];  Service: Gastroenterology;  Laterality: N/A;  with savary dil tt   ESOPHAGOGASTRODUODENOSCOPY (EGD) WITH PROPOFOL N/A 01/20/2020   Procedure: ESOPHAGOGASTRODUODENOSCOPY (EGD) WITH PROPOFOL;  Surgeon: Doran Stabler, MD;  Location: [facility];  Service: Gastroenterology;  Laterality: N/A;  Fluoro-Savary   ORIF ANKLE FRACTURE Right 07/04/2015   Procedure: OPEN REDUCTION INTERNAL FIXATION (ORIF) ANKLE FRACTURE;  Surgeon: Newt Minion, MD;  Location: [facility];  Service: Orthopedics;  Laterality: Right;  OPEN REDUCTION, INTERNAL FIXATION OF RIGHT ANKLE FRACTURE.    ORIF ANKLE FRACTURE Left 02/10/2016   Procedure: OPEN REDUCTION INTERNAL FIXATION (ORIF) ANKLE FRACTURE;  Surgeon: Newt Minion, MD;  Location: [facility];  Service: Orthopedics;  Laterality: Left;   SAVORY DILATION N/A 10/09/2014   Procedure: SAVORY DILATION;  Surgeon: Inda Castle, MD;  Location: [facility];  Service: Endoscopy;  Laterality: N/A;   SAVORY DILATION N/A 09/19/2016   Procedure: SAVORY DILATION;  Surgeon: Doran Stabler, MD;  Location: [facility];  Service: Gastroenterology;  Laterality: N/A;    SAVORY DILATION N/A 01/20/2020   Procedure: SAVORY DILATION;  Surgeon: Doran Stabler, MD;  Location: [facility];  Service: Gastroenterology;  Laterality: N/A;   TONSILLECTOMY     Social History   Social History   Marital Status: Divorced    Spouse Name: N/A   Number of Children: 0   Occupational History   Retired Pharmacist, hospital Other   Social History Main Topics   Smoking status: Former Smoker -- 3.00 packs/day for 45 years    Types: Cigarettes    Quit date: 12/05/1997   Smokeless tobacco: Never Used   Alcohol Use: No   Drug Use: No   Social History Narrative   Patient lives at home alone.   Caffeine Use: 2 cups daily   Current Outpatient Medications on File Prior to Visit  Medication Sig Dispense Refill   albuterol (PROVENTIL HFA;VENTOLIN HFA) 108 (90 Base) MCG/ACT inhaler Inhale 2 puffs into the lungs every 6 (six) hours as needed for wheezing or shortness of breath. 1 Inhaler 2   amLODipine (NORVASC) 10 MG tablet TAKE 1 TABLET(10 MG) BY MOUTH DAILY 90 tablet 0   augmented betamethasone dipropionate (DIPROLENE-AF) 0.05 % ointment      betamethasone dipropionate (DIPROLENE) 0.05 % cream Apply topically 2 (two) times daily. (Patient taking differently: Apply 1 application topically 2 (two) times daily.) 45 g 0   budesonide-formoterol (SYMBICORT) 80-4.5 MCG/ACT inhaler Inhale 2 puffs into the lungs 2 (two) times daily. (Patient taking differently: Inhale 2 puffs into the lungs daily as needed (Wheezing).) 1 Inhaler 6   Continuous Blood Gluc Sensor (DEXCOM G6 SENSOR) MISC Apply new sensor every 10 days for continuous glucose monitoring. 9 each 3   Continuous Blood Gluc Transmit (DEXCOM G6 TRANSMITTER) MISC Use one transmitter every 3 months for continuous glucose monitoring. 1 each 0   DULoxetine (CYMBALTA) 60 MG capsule TAKE ONE CAPSULE BY MOUTH TWICE DAILY 180 capsule 3   Fluocinolone Acetonide Scalp 0.01 % OIL      fluocinonide (LIDEX) 0.05 % external solution      gabapentin  (NEURONTIN) 300 MG capsule TAKE 1 CAPSULE(300 MG) BY MOUTH THREE TIMES DAILY (Patient taking differently: Take 300 mg by mouth 3 (three) times daily.) 270 capsule 4   GVOKE HYPOPEN 1-PACK 1 MG/0.2ML SOAJ Inject under  skin 0.2 ml as needed for hypoglycemia 0.2 mL prn   insulin aspart (NOVOLOG FLEXPEN) 100 UNIT/ML FlexPen INJECT 12-18 UNITS UNDER THE SKIN THREE TIMES DAILY 45 mL 3   insulin degludec (TRESIBA FLEXTOUCH) 100 UNIT/ML FlexTouch Pen Inject up to 32 units into the skin daily 30 mL 3   Insulin Pen Needle (B-D UF III MINI PEN NEEDLES) 31G X 5 MM MISC USE AS DIRECTED WITH INSULIN FOUR TIMES DAILY 400 each 0   ketoconazole (NIZORAL) 2 % cream      metoprolol tartrate (LOPRESSOR) 50 MG tablet      OTEZLA 30 MG TABS      pravastatin (PRAVACHOL) 40 MG tablet Take 1 tablet (40 mg total) by mouth every evening. 90 tablet 3   solifenacin (VESICARE) 10 MG tablet Take 10 mg by mouth daily.     No current facility-administered medications on file prior to visit.   Allergies  Allergen Reactions   Carboplatin Anaphylaxis, Shortness Of Breath and Other (See Comments)    Neuropathy   Remicade [Infliximab] Anaphylaxis   Other    Rifampin Other (See Comments)    unknown   Hydromorphone Rash   Family History  Problem Relation Age of Onset   Other Mother    Anuerysm Mother        brain   Other Father        failure to thrive   Hypertension Other    Stroke Other    Heart attack Other    Hemachromatosis Other        great grandfather   Rheum arthritis Other        both sets of grandparents and father   Migraines Neg Hx    Colon cancer Neg Hx    Colon polyps Neg Hx    Esophageal cancer Neg Hx    Pancreatic cancer Neg Hx    Stomach cancer Neg Hx    Liver disease Neg Hx    PE: BP 110/68 (BP Location: Right Arm, Patient Position: Sitting, Cuff Size: Normal)   Pulse (!) 107   Ht 5\' 6"  (1.676 m)   Wt 167 lb 6.4 oz (75.9 kg)   SpO2 94%   BMI 27.02 kg/m  Wt Readings from Last 3  Encounters:  07/13/21 167 lb 6.4 oz (75.9 kg)  02/15/21 171 lb 14.4 oz (78 kg)  01/12/21 171 lb 9.6 oz (77.8 kg)   Constitutional: normal weight, in NAD Eyes: PERRLA, EOMI, no exophthalmos ENT: moist mucous membranes, no thyromegaly, no cervical lymphadenopathy Cardiovascular: RRR, No MRG, + B LE edema and varicose veins Respiratory: CTA B Gastrointestinal: abdomen soft, NT, ND, BS+ Musculoskeletal: no deformities, strength intact in all 4 Skin: moist, warm, no rashes Neurological: no tremor with outstretched hands, DTR normal in all 4  ASSESSMENT: 1. LADA, insulin-dependent, uncontrolled, with complications - DKA in the setting of dehydration (- PN) - this may be 2/2 ChTx.  Pulmonologist: Dr. Chase Caller Oncologist: Dr. Earlie Server Cardiologist: Dr. Sallyanne Kuster Ophthalmologist: Dr. Clifton James  2.  Peripheral neuropathy -Possibly from Diabetes  PLAN:  1. Patient with longstanding, poorly controlled diabetes, diagnosed as LADA, with very fluctuating CBGs, on basal-bolus insulin regimen.  In 03/2020, she returned after long absence of more than a year after being admitted to the hospital with a blood sugar higher than 1000.  At that time, she was off her CGM but she restarted it afterwards.  At last visit, her sugars were dropping after 10 AM with a nadir around 2 PM.  They started to increase significantly after dinner, around 5:55 PM.  She had dessert after this meal and snacks later on.  Sugars are otherwise stable throughout the night and peaking around 9 AM.  We discussed about the importance of stopping sweets and other snacks at night, but I did advise again to cover the snacks with insulin if she does eat.  We also increased her Tresiba dose slightly.  We decreased her NovoLog with breakfast and lunch to the significant drops after 9 AM up until dinnertime.  HbA1c at last visit was slightly better, at 8.3%. CGM interpretation: -At today's visit, we reviewed her CGM downloads: It appears  that 21.2% of values are in target range (goal >70%), while 78.8% are higher than 180 (goal <25%), and 0% are lower than 70 (goal <4%).  The calculated average blood sugar is 265.  The projected HbA1c for the next 3 months ([facility]) is 9.7%. -Reviewing the CGM trends, they have been much higher than before.  Almost all of her blood sugars are now above goal, with significant increase after lunch.  She [facility] her meals at [facility] and feels that she is eating more.  She also continues to eat at night, having snacks.  She occasionally forgets to take her insulin before dinner and back if she takes it afterwards.  As a consequence, sugars can abruptly decrease after dinner, but usually still within normal ranges.  Sugars overnight are usually very high, but she does have instances when they are almost all at goal.  Therefore, for now, we will not increase the [place] dose.  To cover her meals better, we will increase her insulin with lunch and dinner and I advised her to take a lower dose of NovoLog if she has to take it after a meal but to try her best to take it before the meal. -She absolutely needs to improve her meals, but she is not very motivated to cut down on her sweets for now. - I suggested to:  Patient Instructions  Please use the following insulin doses: - Tresiba 32 units daily  Increase: - NovoLog mealtime dose - take this 15 minutes before a meal B - 15 units L - 18-20 units D - 15-18 units  You may need 5 units of Novolog before a snack or dessert.              Please do not skip the insulin if sugars are 70-100.              If the sugars are 60-70, take 1/2 of the Humalog dose.             If the sugars are <60, skip insulin with that meal If you take Novolog after a meal, take no more than 10 units.             No Novolog at bedtime.             Please return in 3-4 months.   - we checked her HbA1c: 8.9% (higher) - advised to check sugars at different times of the day - 4x a  day, rotating check times - advised for yearly eye exams >> she is not UTD - return to clinic in 3-4 months  2. PN  -No pain, but she does have numbness, stable -She was previously on Lyrica but came off  3.  Hyperlipidemia -Reviewed latest lipid panel from 09/2020: LDL much improved, still above goal, while the rest of the  fractions are at goal: Lab Results  Component Value Date   CHOL 179 10/09/2020   HDL 58.50 10/09/2020   LDLCALC 105 (H) 10/09/2020   LDLDIRECT 156.0 05/10/2019   TRIG 78.0 10/09/2020   CHOLHDL 3 10/09/2020  -She continues on pravastatin 40 mg daily without side effects  Philemon Kingdom, MD PhD [facility] Endocrinology

## 2021-07-13 NOTE — Addendum Note (Signed)
Addended by: Lauralyn Primes on: 07/13/2021 01:03 PM   Modules accepted: Orders

## 2021-07-13 NOTE — Patient Instructions (Addendum)
Please use the following insulin doses: - Tresiba 32 units daily  Increase: - NovoLog mealtime dose - take this 15 minutes before a meal B - 15 units L - 18-20 units D - 15-18 units  You may need 5 units of Novolog before a snack or dessert.              Please do not skip the insulin if sugars are 70-100.              If the sugars are 60-70, take 1/2 of the Humalog dose.             If the sugars are <60, skip insulin with that meal If you take Novolog after a meal, take no more than 10 units.             No Novolog at bedtime.             Please return in 3-4 months.

## 2021-07-19 ENCOUNTER — Other Ambulatory Visit: Payer: Self-pay | Admitting: Cardiovascular Disease

## 2021-08-04 ENCOUNTER — Ambulatory Visit (INDEPENDENT_AMBULATORY_CARE_PROVIDER_SITE_OTHER): Payer: Medicare PPO | Admitting: *Deleted

## 2021-08-04 DIAGNOSIS — E782 Mixed hyperlipidemia: Secondary | ICD-10-CM

## 2021-08-04 DIAGNOSIS — I10 Essential (primary) hypertension: Secondary | ICD-10-CM

## 2021-08-04 DIAGNOSIS — E139 Other specified diabetes mellitus without complications: Secondary | ICD-10-CM

## 2021-08-05 DIAGNOSIS — H25812 Combined forms of age-related cataract, left eye: Secondary | ICD-10-CM | POA: Diagnosis not present

## 2021-08-05 DIAGNOSIS — H2512 Age-related nuclear cataract, left eye: Secondary | ICD-10-CM | POA: Diagnosis not present

## 2021-08-05 NOTE — Chronic Care Management (AMB) (Signed)
Chronic Care Management   CCM RN Visit Note  08/05/2021 Name: Megan [name] MRN: [id number] DOB: 10-11-1941  Subjective: Megan [name] is a 80 y.o. year old female who is a primary care patient of Burns, [name], MD. The care management team was consulted for assistance with disease management and care coordination needs.    Engaged with patient by telephone for initial visit in response to provider referral for case management and/or care coordination services.   Consent to Services:  The patient was given information about Chronic Care Management services, agreed to services, and gave verbal consent 06/16/21 prior to initiation of services.  Please see initial visit note for detailed documentation.  Patient agreed to services and verbal consent obtained.   Assessment: Review of patient past medical history, allergies, medications, health status, including review of consultants reports, laboratory and other test data, was performed as part of comprehensive evaluation and provision of chronic care management services.   SDOH (Social Determinants of Health) assessments and interventions performed:  SDOH Interventions    Flowsheet Row Most Recent Value  SDOH Interventions   Food Insecurity Interventions Intervention Not Indicated  Housing Interventions Intervention Not Indicated  [Patient moved into apartment at [facility] in April 2022]  Transportation Interventions Intervention Not Indicated  [Patient drives self]       CCM Care Plan Allergies  Allergen Reactions   Carboplatin Anaphylaxis, Shortness Of Breath and Other (See Comments)    Neuropathy   Remicade [Infliximab] Anaphylaxis   Other    Rifampin Other (See Comments)    unknown   Hydromorphone Rash   Outpatient Encounter Medications as of 08/04/2021  Medication Sig   albuterol (PROVENTIL HFA;VENTOLIN HFA) 108 (90 Base) MCG/ACT inhaler Inhale 2 puffs into the lungs every 6 (six) hours as needed for wheezing or  shortness of breath.   amLODipine (NORVASC) 10 MG tablet TAKE 1 TABLET(10 MG) BY MOUTH DAILY   augmented betamethasone dipropionate (DIPROLENE-AF) 0.05 % ointment    betamethasone dipropionate (DIPROLENE) 0.05 % cream Apply topically 2 (two) times daily. (Patient taking differently: Apply 1 application topically 2 (two) times daily.)   budesonide-formoterol (SYMBICORT) 80-4.5 MCG/ACT inhaler Inhale 2 puffs into the lungs 2 (two) times daily. (Patient taking differently: Inhale 2 puffs into the lungs daily as needed (Wheezing).)   Continuous Blood Gluc Sensor (DEXCOM G6 SENSOR) MISC Apply new sensor every 10 days for continuous glucose monitoring.   Continuous Blood Gluc Transmit (DEXCOM G6 TRANSMITTER) MISC Use one transmitter every 3 months for continuous glucose monitoring.   DULoxetine (CYMBALTA) 60 MG capsule TAKE ONE CAPSULE BY MOUTH TWICE DAILY   Fluocinolone Acetonide Scalp 0.01 % OIL    fluocinonide (LIDEX) 0.05 % external solution    gabapentin (NEURONTIN) 300 MG capsule TAKE 1 CAPSULE(300 MG) BY MOUTH THREE TIMES DAILY (Patient not taking: Reported on 08/04/2021)   GVOKE HYPOPEN 1-PACK 1 MG/0.2ML SOAJ Inject under skin 0.2 ml as needed for hypoglycemia   insulin aspart (NOVOLOG FLEXPEN) 100 UNIT/ML FlexPen INJECT 12-18 UNITS UNDER THE SKIN THREE TIMES DAILY   insulin degludec (TRESIBA FLEXTOUCH) 100 UNIT/ML FlexTouch Pen Inject up to 32 units into the skin daily   Insulin Pen Needle (B-D UF III MINI PEN NEEDLES) 31G X 5 MM MISC USE AS DIRECTED WITH INSULIN FOUR TIMES DAILY   ketoconazole (NIZORAL) 2 % cream    metoprolol tartrate (LOPRESSOR) 50 MG tablet    OTEZLA 30 MG TABS    pravastatin (PRAVACHOL) 40 MG tablet TAKE 1 TABLET(40  MG) BY MOUTH EVERY EVENING   solifenacin (VESICARE) 10 MG tablet Take 10 mg by mouth daily.   No facility-administered encounter medications on file as of 08/04/2021.   Patient Active Problem List   Diagnosis Date Noted   Aortic atherosclerosis ([place])  10/09/2020   Fall 05/04/2020   Hx of noncompliance with medical treatment, presenting hazards to health 05/04/2020   Concussion 02/19/2020   Head pain 05/10/2019   GERD (gastroesophageal reflux disease) 10/17/2018   Herpes zoster without complication 72/62/0355   Right elbow pain 03/01/2018   Carpal tunnel syndrome on right 11/30/2017   Cough 08/22/2017   Hypokalemia 03/22/2017   Primary osteoarthritis of both hands 03/08/2017   Poor balance 02/07/2017   Subarachnoid bleed ([place]) 01/11/2017   Psoriatic arthropathy ([place]) 09/08/2016   Hypertension 09/08/2016   Diabetic neuropathy ([place]) 09/08/2016   LADA (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), managed as type 1 ([place]) 05/04/2016   Syncope 03/25/2016   Malnutrition of moderate degree 02/29/2016   RLS (restless legs syndrome) 12/11/2015   Lumbago 05/06/2015   Peripheral edema 04/14/2015   Psoriasis 04/14/2015   Emphysema of lung ([place]) 11/26/2014   Radiation-induced esophageal stricture 10/01/2014   DNR (do not resuscitate) discussion 08/21/2014   Neuropathy due to chemotherapeutic drug ([place]) 07/18/2014   Dyspnea 04/28/2014   Radiation esophagitis 04/10/2014   Cancer of middle lobe of lung ([place]) 01/17/2014   Malignant neoplasm of right upper lobe of lung ([place]) 11/28/2013   Conditions to be addressed/monitored:  HTN and DMII  Care Plan : RN Care Manager Plan of Care  Updates made by Knox Royalty, RN since 08/05/2021 12:00 AM     Problem: Chronic Disease Management Needs   Priority: Medium     Long-Range Goal: Development of plan of care for long term chronic disease management   Start Date: 08/04/2021  Expected End Date: 08/04/2022  Note:   Current Barriers:  Chronic Disease Management support and education needs related to HTN and DMII History of frequent falls- no falls reported in > 2 years, but reports ongoing balance issues: does not routinely use assistive devices for ambulation  RNCM Clinical Goal(s):  Patient will  demonstrate ongoing health management independence HTN; DMII  through collaboration with RN Care manager, provider, and care team.   Interventions: 1:1 collaboration with primary care provider regarding development and update of comprehensive plan of care as evidenced by provider attestation and co-signature Inter-disciplinary care team collaboration (see longitudinal plan of care) Evaluation of current treatment plan related to  self management and patient's adherence to plan as established by provider  Diabetes:  (Status: New goal.) Lab Results  Component Value Date   HGBA1C 8.9 (A) 07/13/2021  Assessed patient's understanding of A1c goal: <8% Provided education to patient about basic DM disease process; Reviewed prescribed diet with patient low salt/ heart healthy, carbohydrate-modified/ low sugar; Counseled on importance of regular laboratory monitoring as prescribed;        Discussed plans with patient for ongoing care management follow up and provided patient with direct contact information for care management team;      Reviewed scheduled/upcoming provider appointments including: 08/18/21- cardiology provider;         Review of patient status, including review of consultants reports, relevant laboratory and other test results, and medications completed;       Screening for signs and symptoms of depression related to chronic disease state;        Assessed social determinant of health barriers;  Reviewed with patient recent endocrinology provider office visit 07/13/21- patient has good general understanding of plan of care at home Confirmed patient monitors blood sugars using Dexcon- CGM: monitors several times each day Discussed with patient current dietary habits- she reports "being addicted to sugar;" states she does her best to control portion sizes, but admits she struggles around discretion, given she lives at [facility] and there are always sweets and deserts available:  encouraged patient to continue efforts at portion control, discretion Confirmed patient able to verbalize accurate understanding of current insulin dosing post- endocrinology provider office visit: reports she is currently taking Tresiba 32 U QD as well as mealtime Novolog Patient reports to have cataract surgery "on second eye" tomorrow Discussed personal A1-C trends, she reports her A1-C levels are always between 8-9, based on type of DMII she has, and ongoing consumption of sweet/ sugary food   Hypertension: (Status: New goal.) Last practice recorded BP readings:  BP Readings from Last 3 Encounters:  07/13/21 110/68  02/15/21 131/72  01/12/21 110/80  Most recent eGFR/CrCl:  Lab Results  Component Value Date   EGFR 60 (L) 09/08/2017    No components found for: CRCL  Evaluation of current treatment plan related to hypertension self management and patient's adherence to plan as established by provider;   Discussed plans with patient for ongoing care management follow up and provided patient with direct contact information for care management team; Discussed benefit of following heart healthy, low salt diet in setting of HTN: patient reports she does not consume salt, but there may be salt in food prepared at [place]: she does not add salt to prepared food Confirmed patient does not routinely monitor blood pressures at home Confirmed patient tries to stay active- takes her dog to dog park each morning; she has a balance issue which limits her ability to perform exercise-- she is hoping to begin getting structured activity at [employer]- reports they have pool available- this was encouraged Patient declines need for full medication review: denies concerns, issues, problems around medications and reports independently self-manages medications Encouraged to schedule annual PCP office visit- last visit noted 10/09/20 Confirmed patient obtained flu vaccine and COVID booster #2 (has had 4  total covid vaccines) "last week;"- positive reinforcement provided  Patient Goals/Self-Care Activities: Patient will self administer medications as prescribed as evidenced by self report/primary caregiver report  Patient will attend all scheduled provider appointments as evidenced by clinician review of documented attendance to scheduled appointments and patient/caregiver report Patient will call pharmacy for medication refills as evidenced by patient report and review of pharmacy fill history as appropriate Patient will call provider office for new concerns or questions as evidenced by review of documented incoming telephone call notes and patient report Patient will continue to monitor blood sugars at home and take insulin and other medications as prescribed Patient will try to begin going to the gym at [place] that is available for her use Patient will continue following heart healthy, low salt diet Patient will begin to try to manage portion sizes when she eats sweets and carbohydrates Patient will continue to make every effort to prevent falls      Plan: Telephone follow up appointment with care management team member scheduled for:  Monday, January 24, 2021 at 10:00 am The patient has been provided with contact information for the care management team and has been advised to call with any health related questions or concerns  Oneta Rack, RN, BSN, [employer]  RN Care Coordination- [place] [phone]: direct office [phone]: mobile

## 2021-08-05 NOTE — Patient Instructions (Signed)
Visit Information   Megan [name], it was nice talking with you today.    I look forward to talking to you again for an update on Monday, January 24, 2021 at 10:00 am - please be listening out for my call that day.  I will call as close to 10:00 am as possible.   If you need to cancel or re-schedule our telephone visit, please call [phone] and one of our care guides will be happy to assist you.   I look forward to hearing about your progress.   Please don't hesitate to contact me if I can be of assistance to you before our next scheduled telephone appointment.   Oneta Rack, RN, BSN, [facility] RN Care Coordination- [employer] [phone]: direct office [phone]: mobile   PATIENT GOALS:   Goals Addressed             This Visit's Progress    Patient Self-Care Activities   On track    Timeframe:  Long-Range Goal Priority:  Medium Start Date:        08/04/21                     Expected End Date:     08/04/22                  Patient will self administer medications as prescribed as evidenced by self report/primary caregiver report  Patient will attend all scheduled provider appointments as evidenced by clinician review of documented attendance to scheduled appointments and patient/caregiver report Patient will call pharmacy for medication refills as evidenced by patient report and review of pharmacy fill history as appropriate Patient will call provider office for new concerns or questions as evidenced by review of documented incoming telephone call notes and patient report Patient will continue to monitor blood sugars at home and take insulin and other medications as prescribed Patient will try to begin going to the gym at [place] that is available for her use Patient will continue following heart healthy, low salt diet Patient will begin to try to manage portion sizes when she eats sweets and carbohydrates Patient will continue to make  every effort to prevent falls       Understanding Your Risk for Falls Each year, millions of people have serious injuries from falls. It is important to understand your risk for falling. Talk with your health care provider about your risk and what you can do to lower it. There are actions you can take at home to lower your risk and prevent falls. If you do have a serious fall, make sure to tell your health care provider. Falling once raises your risk of falling again. How can falls affect me? Serious injuries from falls are common. These include: Broken bones, such as hip fractures. Head injuries, such as traumatic brain injuries (TBI) or concussion. A fear of falling can cause you to avoid activities and stay at home. This can make your muscles weaker and actually raise your risk for a fall. What can increase my risk? There are a number of risk factors that increase your risk for falling. The more risk factors you have, the higher your risk of falling. Serious injuries from a fall happen most often to people older than age 13. Children and young adults ages 24-29 are also at higher risk. Common risk factors include: Weakness in the lower body. Lack (deficiency) of vitamin D. Being generally weak or confused  due to long-term (chronic) illness. Dizziness or balance problems. Poor vision. Medicines that cause dizziness or drowsiness. These can include medicines for your blood pressure, heart, anxiety, insomnia, or edema, as well as pain medicines and muscle relaxants. Other risk factors include: Drinking alcohol. Having had a fall in the past. Having depression. Having foot pain or wearing improper footwear. Working at a dangerous job. Having any of the following in your home: Tripping hazards, such as floor clutter or loose rugs. Poor lighting. Pets. Having dementia or memory loss. What actions can I take to lower my risk of falling?   Physical activity Maintain physical fitness.  Do strength and balance exercises. Consider taking a regular class to build strength and balance. Yoga and tai chi are good options. Vision Have your eyes checked every year and your vision prescription updated as needed. Walking aids and footwear Wear nonskid shoes. Do not wear high heels. Do not walk around the house in socks or slippers. Use a cane or walker as told by your health care provider. Home safety Attach secure railings on both sides of your stairs. Install grab bars for your tub, shower, and toilet. Use a bath mat in your tub or shower. Use good lighting in all rooms. Keep a flashlight near your bed. Make sure there is a clear path from your bed to the bathroom. Use night-lights. Do not use throw rugs. Make sure all carpeting is taped or tacked down securely. Remove all clutter from walkways and stairways, including extension cords. Repair uneven or broken steps. Avoid walking on icy or slippery surfaces. Walk on the grass instead of on icy or slick sidewalks. Use ice melt to get rid of ice on walkways. Use a cordless phone. Questions to ask your health care provider Can you help me check my risk for a fall? Do any of my medicines make me more likely to fall? Should I take a vitamin D supplement? What exercises can I do to improve my strength and balance? Should I make an appointment to have my vision checked? Do I need a bone density test to check for weak bones or osteoporosis? Would it help to use a cane or a walker? Where to find more information Centers for Disease Control and Prevention, STEADI: http://www.wolf.info/ Community-Based Fall Prevention Programs: http://www.wolf.info/ National Institute on Aging: http://kim-miller.com/ Contact a health care provider if: You fall at home. You are afraid of falling at home. You feel weak, drowsy, or dizzy. Summary Serious injuries from a fall happen most often to people older than age 66. Children and young adults ages 5-29 are also at higher  risk. Talk with your health care provider about your risks for falling and how to lower those risks. Taking certain precautions at home can lower your risk for falling. If you fall, always tell your health care provider. This information is not intended to replace advice given to you by your health care provider. Make sure you discuss any questions you have with your health care provider. Document Revised: 05/20/2020 Document Reviewed: 05/20/2020 Elsevier Patient Education  2022 [employer].   Consent to CCM Services: Megan [name] was given information about Chronic Care Management services including:  CCM service includes personalized support from designated clinical staff supervised by her physician, including individualized plan of care and coordination with other care providers 24/7 contact phone numbers for assistance for urgent and routine care needs. Service will only be billed when office clinical staff spend 20 minutes or more in a month  to coordinate care. Only one practitioner may furnish and bill the service in a calendar month. The patient may stop CCM services at any time (effective at the end of the month) by phone call to the office staff. The patient will be responsible for cost sharing (co-pay) of up to 20% of the service fee (after annual deductible is met).  Patient agreed to services and verbal consent obtained.   The patient verbalized understanding of instructions, educational materials, and care plan provided today and agreed to receive a mailed copy of patient instructions, educational materials, and care plan Telephone follow up appointment with care management team member scheduled for:  Monday, January 24, 2021 at 10:00 am The patient has been provided with contact information for the care management team and has been advised to call with any health related questions or concerns  Oneta Rack, RN, BSN, [place] [phone]: direct office [phone]: mobile   CLINICAL CARE PLAN: Patient Care Plan: RN Care Manager Plan of Care     Problem Identified: Chronic Disease Management Needs   Priority: Medium     Long-Range Goal: Development of plan of care for long term chronic disease management   Start Date: 08/04/2021  Expected End Date: 08/04/2022  Note:   Current Barriers:  Chronic Disease Management support and education needs related to HTN and DMII History of frequent falls- no falls reported in > 2 years, but reports ongoing balance issues: does not routinely use assistive devices for ambulation  RNCM Clinical Goal(s):  Patient will demonstrate ongoing health management independence HTN; DMII  through collaboration with RN Care manager, provider, and care team.   Interventions: 1:1 collaboration with primary care provider regarding development and update of comprehensive plan of care as evidenced by provider attestation and co-signature Inter-disciplinary care team collaboration (see longitudinal plan of care) Evaluation of current treatment plan related to  self management and patient's adherence to plan as established by provider  Diabetes:  (Status: New goal.) Lab Results  Component Value Date   HGBA1C 8.9 (A) 07/13/2021  Assessed patient's understanding of A1c goal: <8% Provided education to patient about basic DM disease process; Reviewed prescribed diet with patient low salt/ heart healthy, carbohydrate-modified/ low sugar; Counseled on importance of regular laboratory monitoring as prescribed;        Discussed plans with patient for ongoing care management follow up and provided patient with direct contact information for care management team;      Reviewed scheduled/upcoming provider appointments including: 08/18/21- cardiology provider;         Review of patient status, including review of consultants reports, relevant laboratory and other test results, and  medications completed;       Screening for signs and symptoms of depression related to chronic disease state;        Assessed social determinant of health barriers;        Reviewed with patient recent endocrinology provider office visit 07/13/21- patient has good general understanding of plan of care at home Confirmed patient monitors blood sugars using Dexcon- CGM: monitors several times each day Discussed with patient current dietary habits- she reports "being addicted to sugar;" states she does her best to control portion sizes, but admits she struggles around discretion, given she lives at [place] and there are always sweets and deserts available: encouraged patient to continue efforts at portion control, discretion Confirmed patient able to verbalize accurate understanding of current insulin dosing  post- endocrinology provider office visit: reports she is currently taking Tresiba 32 U QD as well as mealtime Novolog Patient reports to have cataract surgery "on second eye" tomorrow Discussed personal A1-C trends, she reports her A1-C levels are always between 8-9, based on type of DMII she has, and ongoing consumption of sweet/ sugary food   Hypertension: (Status: New goal.) Last practice recorded BP readings:  BP Readings from Last 3 Encounters:  07/13/21 110/68  02/15/21 131/72  01/12/21 110/80  Most recent eGFR/CrCl:  Lab Results  Component Value Date   EGFR 60 (L) 09/08/2017    No components found for: CRCL  Evaluation of current treatment plan related to hypertension self management and patient's adherence to plan as established by provider;   Discussed plans with patient for ongoing care management follow up and provided patient with direct contact information for care management team; Discussed benefit of following heart healthy, low salt diet in setting of HTN: patient reports she does not consume salt, but there may be salt in food prepared at [place]: she does not add salt  to prepared food Confirmed patient does not routinely monitor blood pressures at home Confirmed patient tries to stay active- takes her dog to dog park each morning; she has a balance issue which limits her ability to perform exercise-- she is hoping to begin getting structured activity at [employer]- reports they have pool available- this was encouraged Patient declines need for full medication review: denies concerns, issues, problems around medications and reports independently self-manages medications Encouraged to schedule annual PCP office visit- last visit noted 10/09/20 Confirmed patient obtained flu vaccine and COVID booster #2 (has had 4 total covid vaccines) "last week;"- positive reinforcement provided  Patient Goals/Self-Care Activities: Patient will self administer medications as prescribed as evidenced by self report/primary caregiver report  Patient will attend all scheduled provider appointments as evidenced by clinician review of documented attendance to scheduled appointments and patient/caregiver report Patient will call pharmacy for medication refills as evidenced by patient report and review of pharmacy fill history as appropriate Patient will call provider office for new concerns or questions as evidenced by review of documented incoming telephone call notes and patient report Patient will continue to monitor blood sugars at home and take insulin and other medications as prescribed Patient will try to begin going to the gym at [place] that is available for her use Patient will continue following heart healthy, low salt diet Patient will begin to try to manage portion sizes when she eats sweets and carbohydrates Patient will continue to make every effort to prevent falls

## 2021-08-18 ENCOUNTER — Ambulatory Visit: Payer: Medicare PPO | Admitting: Cardiovascular Disease

## 2021-08-19 DIAGNOSIS — E1149 Type 2 diabetes mellitus with other diabetic neurological complication: Secondary | ICD-10-CM | POA: Diagnosis not present

## 2021-08-24 ENCOUNTER — Other Ambulatory Visit: Payer: Self-pay

## 2021-08-24 ENCOUNTER — Other Ambulatory Visit (HOSPITAL_BASED_OUTPATIENT_CLINIC_OR_DEPARTMENT_OTHER): Payer: Self-pay

## 2021-08-24 MED ORDER — INFLUENZA VAC A&B SA ADJ QUAD 0.5 ML IM PRSY
PREFILLED_SYRINGE | INTRAMUSCULAR | 0 refills | Status: DC
  Filled 2021-08-24: qty 0.5, 1d supply, fill #0

## 2021-08-30 DIAGNOSIS — I10 Essential (primary) hypertension: Secondary | ICD-10-CM | POA: Diagnosis not present

## 2021-08-30 DIAGNOSIS — E782 Mixed hyperlipidemia: Secondary | ICD-10-CM

## 2021-08-30 DIAGNOSIS — E139 Other specified diabetes mellitus without complications: Secondary | ICD-10-CM | POA: Diagnosis not present

## 2021-08-31 DIAGNOSIS — Z794 Long term (current) use of insulin: Secondary | ICD-10-CM | POA: Diagnosis not present

## 2021-08-31 DIAGNOSIS — E1165 Type 2 diabetes mellitus with hyperglycemia: Secondary | ICD-10-CM | POA: Diagnosis not present

## 2021-08-31 DIAGNOSIS — E1142 Type 2 diabetes mellitus with diabetic polyneuropathy: Secondary | ICD-10-CM | POA: Diagnosis not present

## 2021-08-31 DIAGNOSIS — E785 Hyperlipidemia, unspecified: Secondary | ICD-10-CM | POA: Diagnosis not present

## 2021-08-31 DIAGNOSIS — K219 Gastro-esophageal reflux disease without esophagitis: Secondary | ICD-10-CM | POA: Diagnosis not present

## 2021-08-31 DIAGNOSIS — C3490 Malignant neoplasm of unspecified part of unspecified bronchus or lung: Secondary | ICD-10-CM | POA: Diagnosis not present

## 2021-08-31 DIAGNOSIS — J439 Emphysema, unspecified: Secondary | ICD-10-CM | POA: Diagnosis not present

## 2021-08-31 DIAGNOSIS — I951 Orthostatic hypotension: Secondary | ICD-10-CM | POA: Diagnosis not present

## 2021-08-31 DIAGNOSIS — I10 Essential (primary) hypertension: Secondary | ICD-10-CM | POA: Diagnosis not present

## 2021-09-15 ENCOUNTER — Telehealth: Payer: Self-pay | Admitting: Internal Medicine

## 2021-09-15 DIAGNOSIS — E139 Other specified diabetes mellitus without complications: Secondary | ICD-10-CM

## 2021-09-15 MED ORDER — TRESIBA FLEXTOUCH 100 UNIT/ML ~~LOC~~ SOPN: PEN_INJECTOR | SUBCUTANEOUS | 3 refills | Status: DC

## 2021-09-15 MED ORDER — NOVOLOG FLEXPEN 100 UNIT/ML ~~LOC~~ SOPN: PEN_INJECTOR | SUBCUTANEOUS | 3 refills | Status: DC

## 2021-09-15 NOTE — Telephone Encounter (Signed)
Patient called re: Patient has a change of PHARM and the requests RX's for the following:[name] MEDICATION:  insulin aspart (NOVOLOG FLEXPEN) 100 UNIT/ML FlexPen   insulin degludec (TRESIBA FLEXTOUCH) 100 UNIT/ML FlexTouch Pen  PHARMACY:  [facility] located at [place], [place] [phone] (Patient states this is now her preferred PHARM)  HAS THE PATIENT CONTACTED THEIR PHARMACY?  Yes  IS THIS A 90 DAY SUPPLY : Yes  IS PATIENT OUT OF MEDICATION: Almost  IF NOT; HOW MUCH IS LEFT: Approx. 3 days   LAST APPOINTMENT DATE: @9 /13/2022  NEXT APPOINTMENT DATE:@11 /18/2022  DO WE HAVE YOUR PERMISSION TO LEAVE A DETAILED MESSAGE?: Yes  OTHER COMMENTS:    **Let patient know to contact pharmacy at the end of the day to make sure medication is ready. **  ** Please notify patient to allow 48-72 hours to process**  **Encourage patient to contact the pharmacy for refills or they can request refills through [facility]**

## 2021-09-15 NOTE — Telephone Encounter (Signed)
Patients pharmacy has now been changed as well as Novolog and [place] sent in

## 2021-09-17 ENCOUNTER — Other Ambulatory Visit: Payer: Self-pay

## 2021-09-17 ENCOUNTER — Ambulatory Visit: Payer: Medicare PPO | Admitting: Internal Medicine

## 2021-09-17 ENCOUNTER — Encounter: Payer: Self-pay | Admitting: Internal Medicine

## 2021-09-17 VITALS — BP 118/82 | HR 109 | Ht 66.0 in | Wt 165.2 lb

## 2021-09-17 DIAGNOSIS — E1149 Type 2 diabetes mellitus with other diabetic neurological complication: Secondary | ICD-10-CM

## 2021-09-17 DIAGNOSIS — E1349 Other specified diabetes mellitus with other diabetic neurological complication: Secondary | ICD-10-CM | POA: Diagnosis not present

## 2021-09-17 DIAGNOSIS — E139 Other specified diabetes mellitus without complications: Secondary | ICD-10-CM

## 2021-09-17 DIAGNOSIS — E782 Mixed hyperlipidemia: Secondary | ICD-10-CM

## 2021-09-17 LAB — POCT GLYCOSYLATED HEMOGLOBIN (HGB A1C): Hemoglobin A1C: 8.3 % — AB (ref 4.0–5.6)

## 2021-09-17 NOTE — Addendum Note (Signed)
Addended by: Lauralyn Primes on: 09/17/2021 01:29 PM   Modules accepted: Orders

## 2021-09-17 NOTE — Progress Notes (Signed)
Patient ID: [name], female   DOB: February 23, 1941, 80 y.o.   MRN: [id number]  This visit occurred during the SARS-CoV-2 public health emergency.  Safety protocols were in place, including screening questions prior to the visit, additional usage of staff PPE, and extensive cleaning of exam room while observing appropriate contact time as indicated for disinfecting solutions.   HPI: [name] is a 80 y.o.-year-old female, presenting for f/u for DM2, dx in 2013, and as LADA in 04/2016, insulin-dependent since 2014, uncontrolled, with complications (DKA, ? Related PN).  Last visit 2 months ago.  Interim history: No increased urination, blurry vision, nausea, chest pain. She has pruritus from her psoriasis.  Pruritus improved on [place]. Before last visit, she sold her house in [name] and [place] [facility] [name] [facility].  She felt her sugars worsened after this. She has UTIs - saw Dr. Jeffie Pollock.  Reviewed history:  She also has a history of lung cancer diagnosed 09/2013.  She had chemotherapy and radiation therapy for this.  She has a complicated medical history, with a diagnosis of ulcerative colitis with significant diarrhea that caused dehydration in the past and episodes of DKA. She had 2 admissions for this in 02/26/2016 and 04/14/2016. While in rehabilitation, her CBGs dropped to 30s on her previous dose of insulin, therefore, her long-acting insulin was decreased.   In 03/2020: Her friend found her in a coma>> taken to the hospital >> Glu 1058.  Before this, patient was eating a lot of sugary desserts (which he decided to eliminate since then).  Also, she was not taking the insulin as recommended.  Reviewed HbA1c levels: Lab Results  Component Value Date   HGBA1C 8.9 (A) 07/13/2021   HGBA1C 8.3 (A) 01/12/2021   HGBA1C 9.3 (A) 08/04/2020  12/30/2015: 12% 09/18/2015: 11.3% 06/17/2015: 7.5% 04/08/2015: 10.9% 09/10/2014: 8.5%  11/04/2013: 6.2%  She inquired  about an insulin pump in the past, but after we discussed about the complexity of managing this, we decided to continue with insulin pens.  She is on: - Tresiba 32 units daily - NovoLog 15 minutes before a meal: B - 15 units L - 18-20 units >> actually taking 15 units D - 15-18 units  You may need 5 units of Novolog before a snack or dessert.              Please do not skip the insulin if sugars are 70-100.              If the sugars are 60-70, take 1/2 of the Humalog dose.             If the sugars are <60, skip insulin with that meal If you take Novolog after a meal, take no more than 10 units.             No Novolog at bedtime.  She checks her sugars more than 4 times a day with her Dexcom G6 CGM- from [facility]:   Prev.:   Previously:   Lowest sugar was 29 >> ...55 >> 70s >> 70 she has hypoglycemia awareness in the 51s. Highest sugar was 1058 >> 429 (at night) >> ...  >400 >> 400.  Pt's meals are: - Breakfast: Cereal or oatmeal with blueberries or eggs with sausage - Lunch: Sandwich and soup - Dinner: Meat, vegetables, small amount of starch - Snacks: 3-4 a day: Peanuts  -+ Mild CKD, last BUN/creatinine:  Lab Results  Component Value Date   BUN  13 02/11/2021   BUN 20 10/09/2020   CREATININE 1.12 (H) 02/11/2021   CREATININE 0.97 10/09/2020   -+ HL: last set of lipids: Lab Results  Component Value Date   CHOL 179 10/09/2020   HDL 58.50 10/09/2020   LDLCALC 105 (H) 10/09/2020   LDLDIRECT 156.0 05/10/2019   TRIG 78.0 10/09/2020   CHOLHDL 3 10/09/2020  On pravastatin 40.  - last eye exam was in 04/2020: No DR; Dr. Lady Gary.  - + numbness but no tingling in her feet.  She has PN from ChTx and possibly also from diabetes.  Off Lyrica.  ROS: + see HPI Cardiovascular: no CP/+ SOB/no palpitations/+ leg swelling Respiratory: no cough/+ SOB/+ wheezing  I reviewed pt's medications, allergies, PMH, social hx, family hx, and changes were documented in the  history of present illness. Otherwise, unchanged from my initial visit note.  Past Medical History:  Diagnosis Date   Cancer ([place]) dx'd 09/2013   Lung ca   Concussion 2018   Diabetes ([place]) 09/08/2016   type I diabetic patient reported on 09/13/16    Diabetes mellitus    insulin   Diabetic neuropathy ([place]) 09/08/2016   Fall 2018   GERD (gastroesophageal reflux disease)    no meds for   History of colitis    History of hiatal hernia    Hx of radiation therapy 12/16/13-01/30/14   lung 66Gy   Hypertension    Malignant neoplasm of right upper lobe of lung ([place]) 11/28/2013   Neuropathy    Psoriasis    Psoriatic arthritis ([place]) 09/08/2016   Rheumatoid arthritis ([place])    Shortness of breath dyspnea    with exertion   Past Surgical History:  Procedure Laterality Date   ABDOMINAL HYSTERECTOMY     ABDOMINAL SURGERY     BALLOON DILATION N/A 12/12/2014   Procedure: BALLOON DILATION;  Surgeon: Inda Castle, MD;  Location: [facility];  Service: Endoscopy;  Laterality: N/A;   BIOPSY  01/20/2020   Procedure: BIOPSY;  Surgeon: Doran Stabler, MD;  Location: [facility];  Service: Gastroenterology;;   CHOLECYSTECTOMY     COLONOSCOPY N/A 02/07/2016   Procedure: COLONOSCOPY;  Surgeon: Doran Stabler, MD;  Location: [place];  Service: Endoscopy;  Laterality: N/A;   DILATION AND CURETTAGE OF UTERUS     ESOPHAGOGASTRODUODENOSCOPY N/A 12/12/2014   Procedure: ESOPHAGOGASTRODUODENOSCOPY (EGD);  Surgeon: Inda Castle, MD;  Location: [facility];  Service: Endoscopy;  Laterality: N/A;  with dilation   ESOPHAGOGASTRODUODENOSCOPY (EGD) WITH PROPOFOL N/A 10/09/2014   Procedure: ESOPHAGOGASTRODUODENOSCOPY (EGD) WITH PROPOFOL;  Surgeon: Inda Castle, MD;  Location: [place];  Service: Endoscopy;  Laterality: N/A;   ESOPHAGOGASTRODUODENOSCOPY (EGD) WITH PROPOFOL N/A 09/19/2016   Procedure: ESOPHAGOGASTRODUODENOSCOPY (EGD) WITH PROPOFOL;  Surgeon: Doran Stabler, MD;  [facility]: [facility];  Service: Gastroenterology;  Laterality: N/A;  with savary dil tt   ESOPHAGOGASTRODUODENOSCOPY (EGD) WITH PROPOFOL N/A 01/20/2020   Procedure: ESOPHAGOGASTRODUODENOSCOPY (EGD) WITH PROPOFOL;  Surgeon: Doran Stabler, MD;  [facility]: [facility];  Service: Gastroenterology;  Laterality: N/A;  Fluoro-[place]   ORIF ANKLE FRACTURE Right 07/04/2015   Procedure: OPEN REDUCTION INTERNAL FIXATION (ORIF) ANKLE FRACTURE;  Surgeon: Newt Minion, MD;  Location: [place];  Service: Orthopedics;  Laterality: Right;  OPEN REDUCTION, INTERNAL FIXATION OF RIGHT ANKLE FRACTURE.    ORIF ANKLE FRACTURE Left 02/10/2016   Procedure: OPEN REDUCTION INTERNAL FIXATION (ORIF) ANKLE FRACTURE;  Surgeon: Newt Minion, MD;  Location: [facility] [facility];  Service: Orthopedics;  Laterality: Left;   SAVORY DILATION N/A 10/09/2014   Procedure: SAVORY DILATION;  Surgeon: Inda Castle, MD;  Location: [facility];  Service: Endoscopy;  Laterality: N/A;   SAVORY DILATION N/A 09/19/2016   Procedure: SAVORY DILATION;  Surgeon: Doran Stabler, MD;  [facility];  Service: Gastroenterology;  Laterality: N/A;   SAVORY DILATION N/A 01/20/2020   Procedure: SAVORY DILATION;  Surgeon: Doran Stabler, MD;  [facility];  Service: Gastroenterology;  Laterality: N/A;   TONSILLECTOMY     Social History   Social History   Marital Status: Divorced    Spouse Name: N/A   Number of Children: 0   Occupational History   Retired Pharmacist, hospital Other   Social History Main Topics   Smoking status: Former Smoker -- 3.00 packs/day for 45 years    Types: Cigarettes    Quit date: 12/05/1997   Smokeless tobacco: Never Used   Alcohol Use: No   Drug Use: No   Social History Narrative   Patient lives at home alone.   Caffeine Use: 2 cups daily   Current Outpatient Medications on File Prior to Visit  Medication Sig Dispense Refill   albuterol (PROVENTIL HFA;VENTOLIN HFA) 108 (90 Base) MCG/ACT inhaler Inhale 2 puffs into the  lungs every 6 (six) hours as needed for wheezing or shortness of breath. 1 Inhaler 2   amLODipine (NORVASC) 10 MG tablet TAKE 1 TABLET(10 MG) BY MOUTH DAILY 90 tablet 0   augmented betamethasone dipropionate (DIPROLENE-AF) 0.05 % ointment      betamethasone dipropionate (DIPROLENE) 0.05 % cream Apply topically 2 (two) times daily. (Patient taking differently: Apply 1 application topically 2 (two) times daily.) 45 g 0   budesonide-formoterol (SYMBICORT) 80-4.5 MCG/ACT inhaler Inhale 2 puffs into the lungs 2 (two) times daily. (Patient taking differently: Inhale 2 puffs into the lungs daily as needed (Wheezing).) 1 Inhaler 6   Continuous Blood Gluc Sensor (DEXCOM G6 SENSOR) MISC Apply new sensor every 10 days for continuous glucose monitoring. 9 each 3   Continuous Blood Gluc Transmit (DEXCOM G6 TRANSMITTER) MISC Use one transmitter every 3 months for continuous glucose monitoring. 1 each 0   DULoxetine (CYMBALTA) 60 MG capsule TAKE ONE CAPSULE BY MOUTH TWICE DAILY 180 capsule 3   Fluocinolone Acetonide Scalp 0.01 % OIL      fluocinonide (LIDEX) 0.05 % external solution      gabapentin (NEURONTIN) 300 MG capsule TAKE 1 CAPSULE(300 MG) BY MOUTH THREE TIMES DAILY (Patient not taking: Reported on 08/04/2021) 270 capsule 4   GVOKE HYPOPEN 1-PACK 1 MG/0.2ML SOAJ Inject under skin 0.2 ml as needed for hypoglycemia 0.2 mL prn   influenza vaccine adjuvanted (FLUAD) 0.5 ML injection Inject into the muscle. 0.5 mL 0   insulin aspart (NOVOLOG FLEXPEN) 100 UNIT/ML FlexPen INJECT 12-18 UNITS UNDER THE SKIN THREE TIMES DAILY 45 mL 3   insulin degludec (TRESIBA FLEXTOUCH) 100 UNIT/ML FlexTouch Pen Inject up to 32 units into the skin daily 30 mL 3   Insulin Pen Needle (B-D UF III MINI PEN NEEDLES) 31G X 5 MM MISC USE AS DIRECTED WITH INSULIN FOUR TIMES DAILY 400 each 0   ketoconazole (NIZORAL) 2 % cream      metoprolol tartrate (LOPRESSOR) 50 MG tablet      OTEZLA 30 MG TABS      pravastatin (PRAVACHOL) 40 MG tablet  TAKE 1 TABLET(40 MG) BY MOUTH EVERY EVENING 90 tablet 0   solifenacin (VESICARE) 10 MG  tablet Take 10 mg by mouth daily.     No current facility-administered medications on file prior to visit.   Allergies  Allergen Reactions   Carboplatin Anaphylaxis, Shortness Of Breath and Other (See Comments)    Neuropathy   Remicade [Infliximab] Anaphylaxis   Other    Rifampin Other (See Comments)    unknown   Hydromorphone Rash   Family History  Problem Relation Age of Onset   Other Mother    Anuerysm Mother        brain   Other Father        failure to thrive   Hypertension Other    Stroke Other    Heart attack Other    Hemachromatosis Other        great grandfather   Rheum arthritis Other        both sets of grandparents and father   Migraines Neg Hx    Colon cancer Neg Hx    Colon polyps Neg Hx    Esophageal cancer Neg Hx    Pancreatic cancer Neg Hx    Stomach cancer Neg Hx    Liver disease Neg Hx    PE: There were no vitals taken for this visit. Wt Readings from Last 3 Encounters:  07/13/21 167 lb 6.4 oz (75.9 kg)  02/15/21 171 lb 14.4 oz (78 kg)  01/12/21 171 lb 9.6 oz (77.8 kg)   Constitutional: normal weight, in NAD Eyes: PERRLA, EOMI, no exophthalmos ENT: moist mucous membranes, no thyromegaly, no cervical lymphadenopathy Cardiovascular: RRR, No MRG, + B LE edema and varicose veins Respiratory: CTA B Gastrointestinal: abdomen soft, NT, ND, BS+ Musculoskeletal: no deformities, strength intact in all 4 Skin: moist, warm, no rashes Neurological: no tremor with outstretched hands, DTR normal in all 4  ASSESSMENT: 1. LADA, insulin-dependent, uncontrolled, with complications - DKA in the setting of dehydration (- PN) - this may be 2/2 ChTx.  Pulmonologist: Dr. Chase Caller Oncologist: Dr. Earlie Server Cardiologist: Dr. Sallyanne Kuster Ophthalmologist: Dr. [place] James  2.  Peripheral neuropathy -Possibly from Diabetes  PLAN:  1. Patient with longstanding, poorly controlled  LADA, with very fluctuating CBGs, on basal-bolus insulin regimen.  At last visit, reviewing the CGM trends, they were much higher compared to before.  She mentions that she moved into an assisted living facility [facility]) and she was eating more.  She was still eating at night, having snacks.  She was not very motivated to cut down on her sweets at that time.  She also was forgetting to take her insulin doses before dinner and she was taking it afterwards.  As a consequence, sugars were decreasing abruptly after dinner at times, but usually not to hypoglycemic values.  Sugars overnight were usually very high but she had instances when they were almost at goal.  At last visit, I again underlined the importance of taking NovoLog 15 minutes before a meal and we also increased her insulin with lunch and dinner and I advised her to take a lower dose of NovoLog if she had to take it after a meal, we continued the same dose of [place]. -At this visit, she tells me that she lost the paper that they gave you at last visit and did not make the suggested changes. CGM interpretation: -At today's visit, we reviewed her CGM downloads: It appears that 16.5% of values are in target range (goal >70%), while 83.5% are higher than 180 (goal <25%), and 0% are lower than 70 (goal <4%).  The calculated average blood  sugar is 263.  The projected HbA1c for the next 3 months ([facility]) is 9.6%. -Reviewing the CGM trends, it appears that for the last 2 weeks, sugars are worse compared to the previous 2 weeks.  Upon questioning, she has a UTI right now.  She sees urology.  Her sugars appear to be increasing significantly after lunch, but she has tremendous variability in her blood sugars in the afternoon.  Upon questioning, she feels that she is taking NovoLog 15 minutes before meals, but she is taking a lower dose.  I did advise her to increase the dose to 18 to 20 units.  At night, her sugars are also increasing to the 300s after dinner  and especially after a snack.  I advised her to increase the insulin with dinner (currently taking approximately 15 units) and to add 5 units of NovoLog before a snack.  We will continue the same dose of Tresiba for now. - I suggested to:  Patient Instructions  Please use the following insulin doses: - Tresiba 32 units daily - NovoLog 15 minutes before a meal: B - 15 units L - 18-20 units D - 18 units   You may need 5 units of Novolog before a snack or dessert.              Please do not skip the insulin if sugars are 70-100.              If the sugars are 60-70, take 1/2 of the Humalog dose.             If the sugars are <60, skip insulin with that meal If you take Novolog after a meal, take no more than 10 units.             No Novolog at bedtime.             Please return in 3-4 months.   - we checked her HbA1c: 8.3% - better - advised to check sugars at different times of the day - 4x a day, rotating check times - advised for yearly eye exams >> she is not UTD - return to clinic in 3-4 months  2. PN  -She has stable numbness in her feet, no pain -Previously on Lyrica, now as  3.  Hyperlipidemia -Reviewed latest lipid panel from 09/2020: LDL much improved, but still above goal, while the rest of the fractions are at goal: Lab Results  Component Value Date   CHOL 179 10/09/2020   HDL 58.50 10/09/2020   LDLCALC 105 (H) 10/09/2020   LDLDIRECT 156.0 05/10/2019   TRIG 78.0 10/09/2020   CHOLHDL 3 10/09/2020  -She continues on pravastatin 40 mg daily without side effects.  Philemon Kingdom, MD PhD [facility] Endocrinology

## 2021-09-17 NOTE — Patient Instructions (Addendum)
Please use the following insulin doses: - Tresiba 32 units daily - NovoLog 15 minutes before a meal: B - 15 units L - 18-20 units D - 18 units   You may need 5 units of Novolog before a snack or dessert.              Please do not skip the insulin if sugars are 70-100.              If the sugars are 60-70, take 1/2 of the Humalog dose.             If the sugars are <60, skip insulin with that meal If you take Novolog after a meal, take no more than 10 units.             No Novolog at bedtime.             Please return in 3-4 months.

## 2021-09-21 ENCOUNTER — Telehealth: Payer: Self-pay | Admitting: Endocrinology

## 2021-09-21 DIAGNOSIS — E139 Other specified diabetes mellitus without complications: Secondary | ICD-10-CM

## 2021-09-21 MED ORDER — NOVOLOG FLEXPEN 100 UNIT/ML ~~LOC~~ SOPN: PEN_INJECTOR | SUBCUTANEOUS | 3 refills | Status: DC

## 2021-09-21 NOTE — Telephone Encounter (Signed)
Rx has been sent to preferred pharmacy.  

## 2021-09-21 NOTE — Telephone Encounter (Signed)
PT is requesting a refill on insulin aspart (NOVOLOG FLEXPEN) 100 UNIT/ML FlexPen  Pt is asking for 1 year supply of this medication.  PT has one left. Please forward to pharmacy   [id number] - [place], [place] Phone:  [phone]  Fax:  [phone]

## 2021-09-22 MED ORDER — NOVOLOG FLEXPEN 100 UNIT/ML ~~LOC~~ SOPN: PEN_INJECTOR | SUBCUTANEOUS | 3 refills | Status: DC

## 2021-09-22 NOTE — Addendum Note (Signed)
Addended by: Lauralyn Primes on: 09/22/2021 10:51 AM   Modules accepted: Orders

## 2021-09-30 ENCOUNTER — Other Ambulatory Visit: Payer: Self-pay | Admitting: Internal Medicine

## 2021-10-07 DIAGNOSIS — N3000 Acute cystitis without hematuria: Secondary | ICD-10-CM | POA: Diagnosis not present

## 2021-10-07 DIAGNOSIS — N3020 Other chronic cystitis without hematuria: Secondary | ICD-10-CM | POA: Diagnosis not present

## 2021-10-11 NOTE — Progress Notes (Signed)
Subjective:    Patient ID: Megan [name], female    DOB: 22-Aug-1941, 80 y.o.   MRN: [id number]  This visit occurred during the SARS-CoV-2 public health emergency.  Safety protocols were in place, including screening questions prior to the visit, additional usage of staff PPE, and extensive cleaning of exam room while observing appropriate contact time as indicated for disinfecting solutions.    HPI The patient is here for an acute visit.   She has had dysuria - has freq UTI's.  Has been seeing urology.  Just finished antibiotics.    Has low energy, fatigue.    Has not been taking gabapentin - not sure if she needs it.   Walks dog.     Medications and allergies reviewed with patient and updated if appropriate.  Patient Active Problem List   Diagnosis Date Noted   Aortic atherosclerosis ([place]) 10/09/2020   Fall 05/04/2020   Hx of noncompliance with medical treatment, presenting hazards to health 05/04/2020   Concussion 02/19/2020   Head pain 05/10/2019   GERD (gastroesophageal reflux disease) 10/17/2018   Herpes zoster without complication 92/33/0076   Right elbow pain 03/01/2018   Carpal tunnel syndrome on right 11/30/2017   Cough 08/22/2017   Hypokalemia 03/22/2017   Primary osteoarthritis of both hands 03/08/2017   Poor balance 02/07/2017   Subarachnoid bleed ([place]) 01/11/2017   Psoriatic arthropathy ([place]) 09/08/2016   Hypertension 09/08/2016   Diabetic neuropathy ([place]) 09/08/2016   LADA (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), managed as type 1 ([place]) 05/04/2016   Syncope 03/25/2016   Malnutrition of moderate degree 02/29/2016   RLS (restless legs syndrome) 12/11/2015   Lumbago 05/06/2015   Peripheral edema 04/14/2015   Psoriasis 04/14/2015   Emphysema of lung ([place]) 11/26/2014   Radiation-induced esophageal stricture 10/01/2014   DNR (do not resuscitate) discussion 08/21/2014   Neuropathy due to chemotherapeutic drug ([place]) 07/18/2014   Dyspnea 04/28/2014    Radiation esophagitis 04/10/2014   Cancer of middle lobe of lung ([place]) 01/17/2014   Malignant neoplasm of right upper lobe of lung ([place]) 11/28/2013    Current Outpatient Medications on File Prior to Visit  Medication Sig Dispense Refill   albuterol (PROVENTIL HFA;VENTOLIN HFA) 108 (90 Base) MCG/ACT inhaler Inhale 2 puffs into the lungs every 6 (six) hours as needed for wheezing or shortness of breath. 1 Inhaler 2   amLODipine (NORVASC) 10 MG tablet TAKE 1 TABLET(10 MG) BY MOUTH DAILY 90 tablet 0   augmented betamethasone dipropionate (DIPROLENE-AF) 0.05 % ointment      betamethasone dipropionate (DIPROLENE) 0.05 % cream Apply topically 2 (two) times daily. (Patient taking differently: Apply 1 application topically 2 (two) times daily.) 45 g 0   budesonide-formoterol (SYMBICORT) 80-4.5 MCG/ACT inhaler Inhale 2 puffs into the lungs 2 (two) times daily. (Patient taking differently: Inhale 2 puffs into the lungs daily as needed (Wheezing).) 1 Inhaler 6   Continuous Blood Gluc Sensor (DEXCOM G6 SENSOR) MISC Apply new sensor every 10 days for continuous glucose monitoring. 9 each 3   Continuous Blood Gluc Transmit (DEXCOM G6 TRANSMITTER) MISC Use one transmitter every 3 months for continuous glucose monitoring. 1 each 0   DULoxetine (CYMBALTA) 60 MG capsule TAKE ONE CAPSULE BY MOUTH TWICE DAILY 180 capsule 3   Fluocinolone Acetonide Scalp 0.01 % OIL      fluocinonide (LIDEX) 0.05 % external solution      gabapentin (NEURONTIN) 300 MG capsule TAKE 1 CAPSULE(300 MG) BY MOUTH THREE TIMES DAILY 270 capsule 4  GVOKE HYPOPEN 1-PACK 1 MG/0.2ML SOAJ Inject under skin 0.2 ml as needed for hypoglycemia 0.2 mL prn   influenza vaccine adjuvanted (FLUAD) 0.5 ML injection Inject into the muscle. 0.5 mL 0   insulin aspart (NOVOLOG FLEXPEN) 100 UNIT/ML FlexPen INJECT 12-18 UNITS UNDER THE SKIN THREE TIMES DAILY 45 mL 3   insulin degludec (TRESIBA FLEXTOUCH) 100 UNIT/ML FlexTouch Pen Inject up to 32 units into the  skin daily 30 mL 3   Insulin Pen Needle (B-D UF III MINI PEN NEEDLES) 31G X 5 MM MISC USE AS DIRECTED WITH INSULIN FOUR TIMES DAILY 400 each 0   ketoconazole (NIZORAL) 2 % cream      metoprolol tartrate (LOPRESSOR) 50 MG tablet      OTEZLA 30 MG TABS      pravastatin (PRAVACHOL) 40 MG tablet TAKE 1 TABLET(40 MG) BY MOUTH EVERY EVENING 90 tablet 0   solifenacin (VESICARE) 10 MG tablet Take 10 mg by mouth daily.     No current facility-administered medications on file prior to visit.    Past Medical History:  Diagnosis Date   Cancer ([place]) dx'd 09/2013   Lung ca   Concussion 2018   Diabetes ([place]) 09/08/2016   type I diabetic patient reported on 09/13/16    Diabetes mellitus    insulin   Diabetic neuropathy ([place]) 09/08/2016   Fall 2018   GERD (gastroesophageal reflux disease)    no meds for   History of colitis    History of hiatal hernia    Hx of radiation therapy 12/16/13-01/30/14   lung 66Gy   Hypertension    Malignant neoplasm of right upper lobe of lung ([place]) 11/28/2013   Neuropathy    Psoriasis    Psoriatic arthritis ([place]) 09/08/2016   Rheumatoid arthritis ([place])    Shortness of breath dyspnea    with exertion    Past Surgical History:  Procedure Laterality Date   ABDOMINAL HYSTERECTOMY     ABDOMINAL SURGERY     BALLOON DILATION N/A 12/12/2014   Procedure: BALLOON DILATION;  Surgeon: Inda Castle, MD;  Location: [facility];  Service: Endoscopy;  Laterality: N/A;   BIOPSY  01/20/2020   Procedure: BIOPSY;  Surgeon: Doran Stabler, MD;  Location: [facility];  Service: Gastroenterology;;   CHOLECYSTECTOMY     COLONOSCOPY N/A 02/07/2016   Procedure: COLONOSCOPY;  Surgeon: Doran Stabler, MD;  Location: [facility];  Service: Endoscopy;  Laterality: N/A;   DILATION AND CURETTAGE OF UTERUS     ESOPHAGOGASTRODUODENOSCOPY N/A 12/12/2014   Procedure: ESOPHAGOGASTRODUODENOSCOPY (EGD);  Surgeon: Inda Castle, MD;  Location: [facility];  Service: Endoscopy;  Laterality:  N/A;  with dilation   ESOPHAGOGASTRODUODENOSCOPY (EGD) WITH PROPOFOL N/A 10/09/2014   Procedure: ESOPHAGOGASTRODUODENOSCOPY (EGD) WITH PROPOFOL;  Surgeon: Inda Castle, MD;  Location: [facility];  Service: Endoscopy;  Laterality: N/A;   ESOPHAGOGASTRODUODENOSCOPY (EGD) WITH PROPOFOL N/A 09/19/2016   Procedure: ESOPHAGOGASTRODUODENOSCOPY (EGD) WITH PROPOFOL;  Surgeon: Doran Stabler, MD;  [facility];  Service: Gastroenterology;  Laterality: N/A;  with savary dil tt   ESOPHAGOGASTRODUODENOSCOPY (EGD) WITH PROPOFOL N/A 01/20/2020   Procedure: ESOPHAGOGASTRODUODENOSCOPY (EGD) WITH PROPOFOL;  Surgeon: Doran Stabler, MD;  [facility];  Service: Gastroenterology;  Laterality: N/A;  Fluoro-Savary   ORIF ANKLE FRACTURE Right 07/04/2015   Procedure: OPEN REDUCTION INTERNAL FIXATION (ORIF) ANKLE FRACTURE;  Surgeon: Newt Minion, MD;  Location: [facility] [place];  Service: Orthopedics;  Laterality: Right;  OPEN REDUCTION, INTERNAL FIXATION  OF RIGHT ANKLE FRACTURE.    ORIF ANKLE FRACTURE Left 02/10/2016   Procedure: OPEN REDUCTION INTERNAL FIXATION (ORIF) ANKLE FRACTURE;  Surgeon: Newt Minion, MD;  Location: [facility];  Service: Orthopedics;  Laterality: Left;   SAVORY DILATION N/A 10/09/2014   Procedure: SAVORY DILATION;  Surgeon: Inda Castle, MD;  Location: [facility];  Service: Endoscopy;  Laterality: N/A;   SAVORY DILATION N/A 09/19/2016   Procedure: SAVORY DILATION;  Surgeon: Doran Stabler, MD;  [facility];  Service: Gastroenterology;  Laterality: N/A;   SAVORY DILATION N/A 01/20/2020   Procedure: SAVORY DILATION;  Surgeon: Doran Stabler, MD;  [facility];  Service: Gastroenterology;  Laterality: N/A;   TONSILLECTOMY      Social History   Socioeconomic History   Marital status: Divorced    Spouse name: Not on file   Number of children: 0   Years of education: MA   Highest education level: Master's degree (e.g., MA, MS, MEng, MEd, MSW, MBA)   Occupational History   Occupation: Retired    Fish farm manager: OTHER  Tobacco Use   Smoking status: Former    Packs/day: 3.00    Years: 45.00    Pack years: 135.00    Types: Cigarettes    Quit date: 12/05/1997    Years since quitting: 23.8   Smokeless tobacco: Never  Vaping Use   Vaping Use: Never used  Substance and Sexual Activity   Alcohol use: No    Alcohol/week: 0.0 standard drinks    Comment: rarely    Drug use: No   Sexual activity: Not Currently  Other Topics Concern   Not on file  Social History Narrative   Patient lives at home. She has an old friend who is her caregiver, Gladys Damme, who stays with her 24/7.   Caffeine Use: 1 cups daily   Right handed   Social Determinants of Health   Financial Resource Strain: Not on file  Food Insecurity: No Food Insecurity   Worried About Charity fundraiser in the Last Year: Never true   Ran Out of Food in the Last Year: Never true  Transportation Needs: No Transportation Needs   Lack of Transportation (Medical): No   Lack of Transportation (Non-Medical): No  Physical Activity: Not on file  Stress: Not on file  Social Connections: Not on file    Family History  Problem Relation Age of Onset   Other Mother    Anuerysm Mother        brain   Other Father        failure to thrive   Hypertension Other    Stroke Other    Heart attack Other    Hemachromatosis Other        great grandfather   Rheum arthritis Other        both sets of grandparents and father   Migraines Neg Hx    Colon cancer Neg Hx    Colon polyps Neg Hx    Esophageal cancer Neg Hx    Pancreatic cancer Neg Hx    Stomach cancer Neg Hx    Liver disease Neg Hx     Review of Systems  Constitutional:  Positive for fatigue. Negative for chills and fever.  Respiratory:  Positive for cough, shortness of breath and wheezing.   Cardiovascular:  Positive for leg swelling. Negative for chest pain and palpitations.  Neurological:  Positive for numbness.  Negative for light-headedness and headaches.  Objective:   Vitals:   10/12/21 1549  BP: 118/70  Pulse: (!) 102  Temp: 98.4 F (36.9 C)  SpO2: 94%   BP Readings from Last 3 Encounters:  10/12/21 118/70  09/17/21 118/82  07/13/21 110/68   Wt Readings from Last 3 Encounters:  10/12/21 165 lb (74.8 kg)  09/17/21 165 lb 3.2 oz (74.9 kg)  07/13/21 167 lb 6.4 oz (75.9 kg)   Body mass index is 26.63 kg/m.   Physical Exam    Constitutional: Appears well-developed and well-nourished. No distress.  Head: Normocephalic and atraumatic.  Neck: Neck supple. No tracheal deviation present. No thyromegaly present.  No cervical lymphadenopathy Cardiovascular: Normal rate, regular rhythm and normal heart sounds.  No murmur heard. No carotid bruit .  Mild b/l LE edema Pulmonary/Chest: Effort normal and breath sounds normal. No respiratory distress. No has no wheezes. No rales.  Skin: Skin is warm and dry. Not diaphoretic.  Psychiatric: Normal mood and affect. Behavior is normal.        Assessment & Plan:    See Problem List for Assessment and Plan of chronic medical problems.

## 2021-10-12 ENCOUNTER — Ambulatory Visit: Payer: Medicare PPO | Admitting: Internal Medicine

## 2021-10-12 ENCOUNTER — Encounter: Payer: Self-pay | Admitting: Internal Medicine

## 2021-10-12 ENCOUNTER — Other Ambulatory Visit: Payer: Self-pay

## 2021-10-12 VITALS — BP 118/70 | HR 102 | Temp 98.4°F | Ht 66.0 in | Wt 165.0 lb

## 2021-10-12 DIAGNOSIS — R300 Dysuria: Secondary | ICD-10-CM

## 2021-10-12 DIAGNOSIS — G620 Drug-induced polyneuropathy: Secondary | ICD-10-CM

## 2021-10-12 DIAGNOSIS — T451X5A Adverse effect of antineoplastic and immunosuppressive drugs, initial encounter: Secondary | ICD-10-CM | POA: Diagnosis not present

## 2021-10-12 DIAGNOSIS — I10 Essential (primary) hypertension: Secondary | ICD-10-CM

## 2021-10-12 DIAGNOSIS — E1169 Type 2 diabetes mellitus with other specified complication: Secondary | ICD-10-CM | POA: Insufficient documentation

## 2021-10-12 DIAGNOSIS — R5383 Other fatigue: Secondary | ICD-10-CM

## 2021-10-12 DIAGNOSIS — E559 Vitamin D deficiency, unspecified: Secondary | ICD-10-CM | POA: Diagnosis not present

## 2021-10-12 DIAGNOSIS — E782 Mixed hyperlipidemia: Secondary | ICD-10-CM | POA: Diagnosis not present

## 2021-10-12 DIAGNOSIS — N390 Urinary tract infection, site not specified: Secondary | ICD-10-CM | POA: Diagnosis not present

## 2021-10-12 DIAGNOSIS — R252 Cramp and spasm: Secondary | ICD-10-CM

## 2021-10-12 MED ORDER — PRAVASTATIN SODIUM 40 MG PO TABS: ORAL_TABLET | ORAL | 1 refills | Status: DC

## 2021-10-12 MED ORDER — AMLODIPINE BESYLATE 10 MG PO TABS: ORAL_TABLET | ORAL | 1 refills | Status: DC

## 2021-10-12 NOTE — Assessment & Plan Note (Signed)
Acute States fatigue Check labs - cbc, cmp, tsh, vitamin d, b12 level Advised starting her vitamin d daily and B12 daily

## 2021-10-12 NOTE — Assessment & Plan Note (Signed)
Acute Has had some dysuria Had uti 6 weeks ago - took keflex x 30 days.  Took azo.  Still had symptoms and rx'd bactrim x 1 week.   Still having dysuria Check UA, UCx

## 2021-10-12 NOTE — Assessment & Plan Note (Signed)
Chronic Check lipid panel  Continue pravastatin 40 mg daily Regular exercise and healthy diet encouraged  

## 2021-10-12 NOTE — Assessment & Plan Note (Signed)
Chronic No longer taking gabapentin  Continue cymbalta 60 mg bid

## 2021-10-12 NOTE — Patient Instructions (Addendum)
° ° °  Blood work was ordered.     Medications changes include :  none   Your prescription(s) have been submitted to your pharmacy. Please take as directed and contact our office if you believe you are having problem(s) with the medication(s).   A referral was ordered for [facility].       Someone from their office will call you to schedule an appointment.    Please followup in 6 months

## 2021-10-12 NOTE — Assessment & Plan Note (Signed)
Chronic BP well controlled Continue amlodipine 10 mg daily, metoprolol 50 mg bid cmp

## 2021-10-12 NOTE — Assessment & Plan Note (Signed)
Acute States muscle cramping in hands, legs Ck magnesium level, cmp

## 2021-10-12 NOTE — Assessment & Plan Note (Signed)
Chronic Not taking vitamin D daily Check vitamin D level

## 2021-10-12 NOTE — Assessment & Plan Note (Signed)
Chronic Has seen urology in past -- would like to see urology again - would like to go someplace else

## 2021-11-08 DIAGNOSIS — E1149 Type 2 diabetes mellitus with other diabetic neurological complication: Secondary | ICD-10-CM | POA: Diagnosis not present

## 2021-11-09 ENCOUNTER — Other Ambulatory Visit: Payer: Self-pay | Admitting: Internal Medicine

## 2021-11-23 ENCOUNTER — Telehealth: Payer: Self-pay | Admitting: Internal Medicine

## 2021-11-23 NOTE — Telephone Encounter (Signed)
Since pt has mychart, message about this has been sent to pt via mychart. Nothing further needed.

## 2021-11-23 NOTE — Telephone Encounter (Signed)
Megan [name]  Pls call [facility]UK[facility] MCGILLIVRAY and wish her well and thanks for referring  a patient to [place] 11/23/2021 and let her know just last month I was thinking of her   Thanks   SIGNATURE    Dr. Brand Males, M.D., F.C.C.P,  Pulmonary and Critical Care Medicine Staff Physician, [place] Director - Interstitial Lung Disease  Program  [facility] [place], [place], [address]  NPI Number:  NPI #[id number]  Pager: [phone], If no answer  -> Check AMION or Try [phone] Telephone (clinical office): [phone] Telephone (research): [phone]  3:49 PM 11/23/2021

## 2021-11-24 DIAGNOSIS — N952 Postmenopausal atrophic vaginitis: Secondary | ICD-10-CM | POA: Insufficient documentation

## 2021-11-24 DIAGNOSIS — R351 Nocturia: Secondary | ICD-10-CM | POA: Insufficient documentation

## 2021-11-24 DIAGNOSIS — R800 Isolated proteinuria: Secondary | ICD-10-CM | POA: Diagnosis not present

## 2021-11-24 DIAGNOSIS — R3915 Urgency of urination: Secondary | ICD-10-CM | POA: Insufficient documentation

## 2021-11-24 DIAGNOSIS — N3941 Urge incontinence: Secondary | ICD-10-CM | POA: Insufficient documentation

## 2021-11-24 DIAGNOSIS — N3281 Overactive bladder: Secondary | ICD-10-CM | POA: Diagnosis not present

## 2021-11-24 DIAGNOSIS — Z87440 Personal history of urinary (tract) infections: Secondary | ICD-10-CM | POA: Diagnosis not present

## 2021-11-25 ENCOUNTER — Other Ambulatory Visit: Payer: Self-pay | Admitting: Internal Medicine

## 2021-12-06 ENCOUNTER — Other Ambulatory Visit: Payer: Self-pay | Admitting: Internal Medicine

## 2021-12-08 ENCOUNTER — Other Ambulatory Visit: Payer: Self-pay | Admitting: Internal Medicine

## 2021-12-15 DIAGNOSIS — L814 Other melanin hyperpigmentation: Secondary | ICD-10-CM | POA: Diagnosis not present

## 2021-12-15 DIAGNOSIS — L400 Psoriasis vulgaris: Secondary | ICD-10-CM | POA: Diagnosis not present

## 2021-12-15 DIAGNOSIS — L988 Other specified disorders of the skin and subcutaneous tissue: Secondary | ICD-10-CM | POA: Diagnosis not present

## 2021-12-15 DIAGNOSIS — L719 Rosacea, unspecified: Secondary | ICD-10-CM | POA: Diagnosis not present

## 2021-12-15 DIAGNOSIS — D692 Other nonthrombocytopenic purpura: Secondary | ICD-10-CM | POA: Diagnosis not present

## 2021-12-15 DIAGNOSIS — L218 Other seborrheic dermatitis: Secondary | ICD-10-CM | POA: Diagnosis not present

## 2021-12-17 ENCOUNTER — Other Ambulatory Visit: Payer: Self-pay | Admitting: Internal Medicine

## 2021-12-21 ENCOUNTER — Other Ambulatory Visit: Payer: Self-pay | Admitting: Internal Medicine

## 2021-12-21 DIAGNOSIS — B351 Tinea unguium: Secondary | ICD-10-CM | POA: Diagnosis not present

## 2021-12-22 ENCOUNTER — Telehealth: Payer: Self-pay | Admitting: Neurology

## 2021-12-22 MED ORDER — DULOXETINE HCL 60 MG PO CPEP: 60.0000 mg | ORAL_CAPSULE | Freq: Two times a day (BID) | ORAL | 0 refills | Status: DC

## 2021-12-22 NOTE — Telephone Encounter (Signed)
Pt has scheduled her annual f/u and Pt has called for a refill on her  DULoxetine (CYMBALTA) 60 MG capsule (90 day) to [id number]

## 2021-12-22 NOTE — Telephone Encounter (Signed)
Ok one refill [facility] to [phone].

## 2021-12-24 ENCOUNTER — Ambulatory Visit: Payer: Medicare PPO | Admitting: Internal Medicine

## 2021-12-28 ENCOUNTER — Other Ambulatory Visit: Payer: Self-pay | Admitting: Cardiovascular Disease

## 2021-12-28 DIAGNOSIS — B351 Tinea unguium: Secondary | ICD-10-CM | POA: Diagnosis not present

## 2021-12-29 ENCOUNTER — Ambulatory Visit: Payer: Medicare PPO | Admitting: Internal Medicine

## 2021-12-29 ENCOUNTER — Ambulatory Visit (INDEPENDENT_AMBULATORY_CARE_PROVIDER_SITE_OTHER): Payer: Medicare PPO | Admitting: Family Medicine

## 2021-12-29 ENCOUNTER — Other Ambulatory Visit: Payer: Self-pay

## 2021-12-29 ENCOUNTER — Ambulatory Visit: Payer: Self-pay

## 2021-12-29 ENCOUNTER — Ambulatory Visit (INDEPENDENT_AMBULATORY_CARE_PROVIDER_SITE_OTHER): Payer: Medicare PPO

## 2021-12-29 VITALS — BP 110/70 | HR 106 | Ht 66.0 in | Wt 168.0 lb

## 2021-12-29 DIAGNOSIS — M545 Low back pain, unspecified: Secondary | ICD-10-CM | POA: Diagnosis not present

## 2021-12-29 DIAGNOSIS — M25562 Pain in left knee: Secondary | ICD-10-CM

## 2021-12-29 DIAGNOSIS — G8929 Other chronic pain: Secondary | ICD-10-CM

## 2021-12-29 NOTE — Patient Instructions (Addendum)
Good to see you  ? ?Injection given in left knee today  ?Call or go to the ER if you develop a large red swollen joint with extreme pain or oozing puss.  ? ?I've referred you to Physical Therapy at [facility].  Let us know if you don't hear from them in one week.  ? ?Follow up in 6 weeks ?

## 2021-12-29 NOTE — Progress Notes (Signed)
? ?I, Peterson Lombard, LAT, ATC acting as a scribe for Lynne Leader, MD. ? ?Megan [name] is a 81 y.o. female who presents to [place] at [facility] today for L knee pain. Pt was previously seen by Dr. Georgina Snell on 08/12/20 for a contusion on her R foot. Today, pt c/o L knee pain for years but has gotten worse and has had a injection in the past that helped a lot. Patient locates pain to lateral Left knee. Patient states that her bilateral low back yesterday and also having left hip pain that has been going on for years and getting worse.  ? ?L knee swelling: no ?Mechanical symptoms: no ?Aggravates: sleeping on the hips cause the hip pain to be worse.  ?Treatments tried: Tylenol, ibuprofen, heat  ? ?Dx imaging: 07/22/14 L knee XR ? ?Pertinent review of systems: No fevers or chills ? ?Relevant historical information: History of lung cancer  ?Tender palpation medial joint line. ? ? ?Exam:  ?BP 110/70   Pulse (!) 106   Ht 5\' 6"  (1.676 m)   Wt 168 lb (76.2 kg)   SpO2 94%   BMI 27.12 kg/m?  ?General: Well Developed, well nourished, and in no acute distress.  ? ?MSK: left knee: Mild joint effusion. ?Normal motion with crepitation. ? ?L-spine: Nontender midline. ?Mildly tender palpation lumbar paraspinal musculature. ?Decreased lumbar motion. ?Hips bilaterally normal-appearing ?Nontender greater trochanter.  Decreased hip motion. ? ? ? ?Lab and Radiology Results ? ?Procedure: Real-time Ultrasound Guided Injection of left knee superior lateral patellar space ?Device: Philips Affiniti 50G ?Images permanently stored and available for review in PACS ?Verbal informed consent obtained.  Discussed risks and benefits of procedure. Warned about infection bleeding damage to structures skin hypopigmentation and fat atrophy among others. ?Patient expresses understanding and agreement ?Time-out conducted.   ?Noted no overlying erythema, induration, or other signs of local infection.   ?Skin prepped in a  sterile fashion.   ?Local anesthesia: Topical Ethyl chloride.   ?With sterile technique and under real time ultrasound guidance: 40 mg of Kenalog and 2 mL of Marcaine injected into knee joint. Fluid seen entering the joint capsule.   ?Completed without difficulty   ?Pain immediately resolved suggesting accurate placement of the medication.   ?Advised to call if fevers/chills, erythema, induration, drainage, or persistent bleeding.   ?Images permanently stored and available for review in the ultrasound unit.  ?Impression: Technically successful ultrasound guided injection. ? ? ?X-ray images left knee and L-spine obtained today personally and independently interpreted ? ?Left knee: Moderate to severe medial compartment DJD.  No acute fractures are visible. ? ?L-spine: Anterolisthesis L4-L5.  Facet DJD worse present L4-5 L5-S1 no acute fractures. ? ?Await formal radiology review ? ? ? ? ?Assessment and Plan: ?81 y.o. female with left knee pain thought to be due to exacerbation of DJD.  Plan for steroid injection and physical therapy. ? ?Additionally she notes an acute exacerbation of bilateral low back pain.  She causes hip pain but she points to the base of her lumbar spine/buttocks area.  Again physical therapy should be first step treatment for this.  She lives at [facility] so that would be an obvious choice for physical therapy. ? ?Recheck in 1 month. ? ? ?PDMP not reviewed this encounter. ?Orders Placed This Encounter  ?Procedures  ? [place] LIMITED JOINT SPACE STRUCTURES LOW LEFT(NO LINKED CHARGES)  ?  Standing Status:   Future  ?  Number of Occurrences:   1  ?  Standing Expiration Date:   07/01/2022  ?  Order Specific Question:   Reason for Exam (SYMPTOM  OR DIAGNOSIS REQUIRED)  ?  Answer:   Left knee pain  ?  Order Specific Question:   Preferred imaging location?  ?  Answer:   [facility]  ? DG Knee AP/LAT W/[place] Left  ?  Standing Status:   Future  ?  Number of Occurrences:   1  ?   Standing Expiration Date:   01/29/2022  ?  Order Specific Question:   Reason for Exam (SYMPTOM  OR DIAGNOSIS REQUIRED)  ?  Answer:   left knee pain  ?  Order Specific Question:   Preferred imaging location?  ?  Answer:   [facility]  ? DG Lumbar Spine 2-3 Views  ?  Standing Status:   Future  ?  Number of Occurrences:   1  ?  Standing Expiration Date:   01/29/2022  ?  Order Specific Question:   Reason for Exam (SYMPTOM  OR DIAGNOSIS REQUIRED)  ?  Answer:   low back pain  ?  Order Specific Question:   Preferred imaging location?  ?  Answer:   [facility]  ? Ambulatory referral to Physical Therapy  ?  Referral Priority:   Routine  ?  Referral Type:   Physical Medicine  ?  Referral Reason:   Specialty Services Required  ?  Requested Specialty:   Physical Therapy  ?  Number of Visits Requested:   1  ? ?No orders of the defined types were placed in this encounter. ? ? ? ?Discussed warning signs or symptoms. Please see discharge instructions. Patient expresses understanding. ? ? ?The above documentation has been reviewed and is accurate and complete Lynne Leader, M.D. ? ? ?

## 2021-12-31 ENCOUNTER — Telehealth: Payer: Self-pay | Admitting: Internal Medicine

## 2021-12-31 NOTE — Progress Notes (Signed)
Lumbar spine x-ray shows arthritis changes present especially from L4-S1.

## 2021-12-31 NOTE — Telephone Encounter (Signed)
Mendel Ryder w/ [facility] is requesting all ov notes that supports referral ? ?Fax [phone] attn: Mendel Ryder ?

## 2021-12-31 NOTE — Progress Notes (Signed)
Left knee x-ray shows mild arthritis.

## 2022-01-05 DIAGNOSIS — M25562 Pain in left knee: Secondary | ICD-10-CM | POA: Diagnosis not present

## 2022-01-05 DIAGNOSIS — N390 Urinary tract infection, site not specified: Secondary | ICD-10-CM | POA: Diagnosis not present

## 2022-01-05 DIAGNOSIS — R5383 Other fatigue: Secondary | ICD-10-CM | POA: Diagnosis not present

## 2022-01-05 DIAGNOSIS — R2689 Other abnormalities of gait and mobility: Secondary | ICD-10-CM | POA: Diagnosis not present

## 2022-01-05 DIAGNOSIS — R2681 Unsteadiness on feet: Secondary | ICD-10-CM | POA: Diagnosis not present

## 2022-01-10 ENCOUNTER — Telehealth: Payer: Self-pay | Admitting: Family Medicine

## 2022-01-10 NOTE — Telephone Encounter (Signed)
error 

## 2022-01-11 DIAGNOSIS — B351 Tinea unguium: Secondary | ICD-10-CM | POA: Diagnosis not present

## 2022-01-12 DIAGNOSIS — M25562 Pain in left knee: Secondary | ICD-10-CM | POA: Diagnosis not present

## 2022-01-12 DIAGNOSIS — R5383 Other fatigue: Secondary | ICD-10-CM | POA: Diagnosis not present

## 2022-01-12 DIAGNOSIS — R2681 Unsteadiness on feet: Secondary | ICD-10-CM | POA: Diagnosis not present

## 2022-01-12 DIAGNOSIS — R2689 Other abnormalities of gait and mobility: Secondary | ICD-10-CM | POA: Diagnosis not present

## 2022-01-19 DIAGNOSIS — R2681 Unsteadiness on feet: Secondary | ICD-10-CM | POA: Diagnosis not present

## 2022-01-19 DIAGNOSIS — R5383 Other fatigue: Secondary | ICD-10-CM | POA: Diagnosis not present

## 2022-01-19 DIAGNOSIS — R2689 Other abnormalities of gait and mobility: Secondary | ICD-10-CM | POA: Diagnosis not present

## 2022-01-19 DIAGNOSIS — M25562 Pain in left knee: Secondary | ICD-10-CM | POA: Diagnosis not present

## 2022-01-24 ENCOUNTER — Other Ambulatory Visit: Payer: Self-pay

## 2022-01-24 ENCOUNTER — Encounter: Payer: Self-pay | Admitting: Internal Medicine

## 2022-01-24 ENCOUNTER — Ambulatory Visit (INDEPENDENT_AMBULATORY_CARE_PROVIDER_SITE_OTHER): Payer: Medicare PPO | Admitting: *Deleted

## 2022-01-24 ENCOUNTER — Ambulatory Visit: Payer: Medicare PPO | Admitting: Internal Medicine

## 2022-01-24 DIAGNOSIS — E782 Mixed hyperlipidemia: Secondary | ICD-10-CM

## 2022-01-24 DIAGNOSIS — I10 Essential (primary) hypertension: Secondary | ICD-10-CM

## 2022-01-24 DIAGNOSIS — E139 Other specified diabetes mellitus without complications: Secondary | ICD-10-CM

## 2022-01-24 LAB — POCT GLYCOSYLATED HEMOGLOBIN (HGB A1C): Hemoglobin A1C: 8.4 % — AB (ref 4.0–5.6)

## 2022-01-24 MED ORDER — TRESIBA FLEXTOUCH 100 UNIT/ML ~~LOC~~ SOPN: PEN_INJECTOR | SUBCUTANEOUS | 3 refills | Status: AC

## 2022-01-24 MED ORDER — NOVOLOG FLEXPEN 100 UNIT/ML ~~LOC~~ SOPN: PEN_INJECTOR | SUBCUTANEOUS | 3 refills | Status: AC

## 2022-01-24 NOTE — Patient Instructions (Addendum)
Please use the following insulin doses: ?- Tresiba 36 units daily ?- NovoLog 15 minutes before a meal: ?B - 18 units ?L - 20-22 units ?D - 18-22 units  ?You may need 5 units of Novolog before a snack or dessert. ? ?            Please do not skip the insulin if sugars are 70-100.  ?            If the sugars are 60-70, take 1/2 of the Humalog dose. ?            If the sugars are <60, skip insulin with that meal ?If you take Novolog after a meal, take no more than 10 units. ?            No Novolog at bedtime. ?            ?Please return in 3-4 months.  ?

## 2022-01-24 NOTE — Patient Instructions (Addendum)
Visit Information ? ?Megan [name], thank you for taking time to talk with me today. Please don't hesitate to contact me if I can be of assistance to you before our next scheduled telephone appointment ? ?Below are the goals we discussed today:  ?Patient Self-Care Activities: ?Patient Megan [name] will: ?Take medications as prescribed  ?Attend all scheduled provider appointments  ?Call pharmacy for medication refills  ?Call provider office for new concerns or questions  ?Continue to monitor blood sugars at home and take insulin and other medications as prescribed ?Continue working with your physical therapist at [facility] and going to the gym  ?Continue doing your best to follow a heart healthy, low salt, low cholesterol diet ?Keep trying to manage portion sizes when she eats sweets and carbohydrates ?Keep up the great work to prevent falls ? ?Our next scheduled telephone follow up visit/ appointment is scheduled on: Monday, July 25, 2022 at 9:45 am- This is a PHONE CALL appointment ? ?If you need to cancel or re-schedule our visit, please call [phone] and our care guide team will be happy to assist you. ?  ?I look forward to hearing about your progress. ?  ?Oneta Rack, RN, BSN, CCRN Alumnus ?[place] ?([phone]: direct office ? ?If you are experiencing a Mental Health or [place] or need someone to talk to, please  ?call the Suicide and Crisis Lifeline: 988 ?call the [place] National Suicide Prevention Lifeline: 613-505-7539 or TTY: 7041117684 TTY (708) 123-1730) to talk to a trained counselor ?call 1-800-273-TALK (toll free, 24 hour hotline) ?go to [facility] [address], [place] [phone]) ?call 911  ? ?The patient verbalized understanding of instructions, educational materials, and care plan provided today and agreed to receive a mailed copy of patient instructions, educational materials, and  care plan ? ?Cholesterol Content in Foods ?Cholesterol is a waxy, fat-like substance that helps to carry fat in the blood. The body needs cholesterol in small amounts, but too much cholesterol can cause damage to the arteries and heart. ?What foods have cholesterol? ?Cholesterol is found in animal-based foods, such as meat, seafood, and dairy. Generally, low-fat dairy and lean meats have less cholesterol than full-fat dairy and fatty meats. The milligrams of cholesterol per serving (mg per serving) of common cholesterol-containing foods are listed below. ?Meats and other proteins ?Egg -- one large whole egg has 186 mg. ?Veal shank -- 4 oz (113 g) has 141 mg. ?Lean ground [place] (93% lean) -- 4 oz (113 g) has 118 mg. ?Fat-trimmed lamb loin -- 4 oz (113 g) has 106 mg. ?Lean ground beef (90% lean) -- 4 oz (113 g) has 100 mg. ?Lobster -- 3.5 oz (99 g) has 90 mg. ?Pork loin chops -- 4 oz (113 g) has 86 mg. ?Canned salmon -- 3.5 oz (99 g) has 83 mg. ?Fat-trimmed beef top loin -- 4 oz (113 g) has 78 mg. ?Frankfurter -- 1 frank (3.5 oz or 99 g) has 77 mg. ?Crab -- 3.5 oz (99 g) has 71 mg. ?Roasted chicken without skin, white meat -- 4 oz (113 g) has 66 mg. ?Light bologna -- 2 oz (57 g) has 45 mg. ?Deli-cut [place] -- 2 oz (57 g) has 31 mg. ?Canned tuna -- 3.5 oz (99 g) has 31 mg. ?Berniece Salines -- 1 oz (28 g) has 29 mg. ?Oysters and mussels (raw) -- 3.5 oz (99 g) has 25 mg. ?Mackerel -- 1 oz (28 g) has 22 mg. ?Trout --  1 oz (28 g) has 20 mg. ?Pork sausage -- 1 link (1 oz or 28 g) has 17 mg. ?Salmon -- 1 oz (28 g) has 16 mg. ?Tilapia -- 1 oz (28 g) has 14 mg. ?Dairy ?Soft-serve ice cream -- ? cup (4 oz or 86 g) has 103 mg. ?Whole-milk yogurt -- 1 cup (8 oz or 245 g) has 29 mg. ?Cheddar cheese -- 1 oz (28 g) has 28 mg. ?American cheese -- 1 oz (28 g) has 28 mg. ?Whole milk -- 1 cup (8 oz or 250 mL) has 23 mg. ?2% milk -- 1 cup (8 oz or 250 mL) has 18 mg. ?Cream cheese -- 1 tablespoon (Tbsp) (14.5 g) has 15 mg. ?Cottage cheese -- ? cup  (4 oz or 113 g) has 14 mg. ?Low-fat (1%) milk -- 1 cup (8 oz or 250 mL) has 10 mg. ?Sour cream -- 1 Tbsp (12 g) has 8.5 mg. ?Low-fat yogurt -- 1 cup (8 oz or 245 g) has 8 mg. ?Nonfat Greek yogurt -- 1 cup (8 oz or 228 g) has 7 mg. ?Half-and-half cream -- 1 Tbsp (15 mL) has 5 mg. ?Fats and oils ?Cod liver oil -- 1 tablespoon (Tbsp) (13.6 g) has 82 mg. ?Butter -- 1 Tbsp (14 g) has 15 mg. ?Lard -- 1 Tbsp (12.8 g) has 14 mg. ?Bacon grease -- 1 Tbsp (12.9 g) has 14 mg. ?Mayonnaise -- 1 Tbsp (13.8 g) has 5-10 mg. ?Margarine -- 1 Tbsp (14 g) has 3-10 mg. ?The items listed above may not be a complete list of foods with cholesterol. Exact amounts of cholesterol in these foods may vary depending on specific ingredients and brands. Contact a dietitian for more information. ?What foods do not have cholesterol? ?Most plant-based foods do not have cholesterol unless you combine them with a food that has cholesterol. Foods without cholesterol include: ?Grains and cereals. ?Vegetables. ?Fruits. ?Vegetable oils, such as olive, canola, and sunflower oil. ?Legumes, such as peas, beans, and lentils. ?Nuts and seeds. ?Egg whites. ?The items listed above may not be a complete list of foods that do not have cholesterol. Contact a dietitian for more information. ?Summary ?The body needs cholesterol in small amounts, but too much cholesterol can cause damage to the arteries and heart. ?Cholesterol is found in animal-based foods, such as meat, seafood, and dairy. Generally, low-fat dairy and lean meats have less cholesterol than full-fat dairy and fatty meats. ?This information is not intended to replace advice given to you by your health care provider. Make sure you discuss any questions you have with your health care provider. ?Document Revised: 02/26/2021 Document Reviewed: 02/26/2021 ?Elsevier Patient Education ? [place]. ? ?

## 2022-01-24 NOTE — Progress Notes (Signed)
Patient ID: Megan [name], female   DOB: December 24, 1940, 81 y.o.   MRN: [id number] ? ?This visit occurred during the SARS-CoV-2 public health emergency.  Safety protocols were in place, including screening questions prior to the visit, additional usage of staff PPE, and extensive cleaning of exam room while observing appropriate contact time as indicated for disinfecting solutions.  ? ?HPI: ?Megan [name] is a 81 y.o.-year-old female, presenting for f/u for DM2, dx in 2013, and as LADA in 04/2016, insulin-dependent since 2014, uncontrolled, with complications (DKA, ? Related PN).  Last visit 4 months ago. ? ?Interim history: ?No increased urination, blurry vision, nausea, chest pain. She has UTIs - sees Dr. Jeffie Pollock. ?She has pruritus from her psoriasis.  Pruritus improved on [place]. ?She resides in [facility].  She felt her sugars worsened after this. ?She is now doing PT  - started 1 week ago. She now exercises 5 days a week. ? ?Reviewed history: ? She also has a history of lung cancer diagnosed 09/2013.  She had chemotherapy and radiation therapy for this. ? ?She has a complicated medical history, with a diagnosis of ulcerative colitis with significant diarrhea that caused dehydration in the past and episodes of DKA. She had 2 admissions for this in 02/26/2016 and 04/14/2016. While in rehabilitation, her CBGs dropped to 30s on her previous dose of insulin, therefore, her long-acting insulin was decreased.  ? ?In 03/2020: Her friend found her in a coma>> taken to the hospital >> Glu 1058.  Before this, patient was eating a lot of sugary desserts.  Also, she was not taking the insulin as recommended. ? ?Reviewed HbA1c levels: ?Lab Results  ?Component Value Date  ? HGBA1C 8.3 (A) 09/17/2021  ? HGBA1C 8.9 (A) 07/13/2021  ? HGBA1C 8.3 (A) 01/12/2021  ?12/30/2015: 12% ?09/18/2015: 11.3% ?06/17/2015: 7.5% ?04/08/2015: 10.9% ?09/10/2014: 8.5%  ?11/04/2013: 6.2% ? ?She inquired about an  insulin pump in the past, but after we discussed about the complexity of managing this, we decided to continue with insulin pens. ? ?She is on: ?- Tresiba 32 >> 30 units daily ?- NovoLog 15 minutes before a meal: ?B - 15 units ?L -20 units ?D - 18 >> but forgot about this and still taking 15-16 units  ?You may need 5 units of Novolog before a snack or dessert. ? ?            Please do not skip the insulin if sugars are 70-100.  ?            If the sugars are 60-70, take 1/2 of the Humalog dose. ?            If the sugars are <60, skip insulin with that meal ?If you take Novolog after a meal, take no more than 10 units. ?            No Novolog at bedtime. ? ?She checks her sugars more than 4 times a day with her Dexcom G6 CGM- from [facility]: ? ? ?Previously: ? ? ?Prev.: ? ? ?Lowest sugar was 29 >> ...55 >> 70s >> 70 >> 55 x1, 82  she has hypoglycemia awareness in the 50s. ?Highest sugar was 1058 >> 429 (at night) >> ...  >400 >> 400 >> HI. ? ?Pt's meals are: ?- Breakfast: Cereal or oatmeal with blueberries or eggs with sausage ?- Lunch: Sandwich and soup ?- Dinner: Meat, vegetables, small amount of starch ?- Snacks: 3-4 a day: Peanuts ? ?-+  Mild CKD, last BUN/creatinine:  ?Lab Results  ?Component Value Date  ? BUN 13 02/11/2021  ? BUN 20 10/09/2020  ? CREATININE 1.12 (H) 02/11/2021  ? CREATININE 0.97 10/09/2020  ? ?-+ HL: last set of lipids: ?Lab Results  ?Component Value Date  ? CHOL 179 10/09/2020  ? HDL 58.50 10/09/2020  ? LDLCALC 105 (H) 10/09/2020  ? LDLDIRECT 156.0 05/10/2019  ? TRIG 78.0 10/09/2020  ? CHOLHDL 3 10/09/2020  ?On pravastatin 40. ? ?- last eye exam was in 04/2020: No DR; Dr. Lady Gary. ? ?- + numbness but no tingling in her feet.  She has PN from ChTx and possibly also from diabetes.  Off Lyrica.  On Cymbalta. ? ?ROS: ?+ see HPI ?Cardiovascular: no CP/+ SOB/no palpitations/+ leg swelling ?Respiratory: no cough/+ SOB/+ wheezing ? ?I reviewed pt's medications, allergies, PMH, social hx, family  hx, and changes were documented in the history of present illness. Otherwise, unchanged from my initial visit note. ? ?Past Medical History:  ?Diagnosis Date  ? Cancer [facility]) dx'd 09/2013  ? Lung ca  ? Concussion 2018  ? Diabetes ([place]) 09/08/2016  ? type I diabetic patient reported on 09/13/16   ? Diabetes mellitus   ? insulin  ? Diabetic neuropathy ([place]) 09/08/2016  ? Fall 2018  ? GERD (gastroesophageal reflux disease)   ? no meds for  ? History of colitis   ? History of hiatal hernia   ? Hx of radiation therapy 12/16/13-01/30/14  ? lung 66Gy  ? Hypertension   ? Malignant neoplasm of right upper lobe of lung ([place]) 11/28/2013  ? Neuropathy   ? Psoriasis   ? Psoriatic arthritis ([place]) 09/08/2016  ? Rheumatoid arthritis ([place])   ? Shortness of breath dyspnea   ? with exertion  ? ?Past Surgical History:  ?Procedure Laterality Date  ? ABDOMINAL HYSTERECTOMY    ? ABDOMINAL SURGERY    ? BALLOON DILATION N/A 12/12/2014  ? Procedure: BALLOON DILATION;  Surgeon: Inda Castle, MD;  Location: [facility];  Service: Endoscopy;  Laterality: N/A;  ? BIOPSY  01/20/2020  ? Procedure: BIOPSY;  Surgeon: Doran Stabler, MD;  Location: [facility];  Service: Gastroenterology;;  ? CHOLECYSTECTOMY    ? COLONOSCOPY N/A 02/07/2016  ? Procedure: COLONOSCOPY;  Surgeon: Doran Stabler, MD;  Location: [facility];  Service: Endoscopy;  Laterality: N/A;  ? DILATION AND CURETTAGE OF UTERUS    ? ESOPHAGOGASTRODUODENOSCOPY N/A 12/12/2014  ? Procedure: ESOPHAGOGASTRODUODENOSCOPY (EGD);  Surgeon: Inda Castle, MD;  Location: [facility];  Service: Endoscopy;  Laterality: N/A;  with dilation  ? ESOPHAGOGASTRODUODENOSCOPY (EGD) WITH PROPOFOL N/A 10/09/2014  ? Procedure: ESOPHAGOGASTRODUODENOSCOPY (EGD) WITH PROPOFOL;  Surgeon: Inda Castle, MD;  Location: [facility];  Service: Endoscopy;  Laterality: N/A;  ? ESOPHAGOGASTRODUODENOSCOPY (EGD) WITH PROPOFOL N/A 09/19/2016  ? Procedure: ESOPHAGOGASTRODUODENOSCOPY (EGD) WITH PROPOFOL;  Surgeon:  Doran Stabler, MD;  Location: [facility];  Service: Gastroenterology;  Laterality: N/A;  with savary dil tt  ? ESOPHAGOGASTRODUODENOSCOPY (EGD) WITH PROPOFOL N/A 01/20/2020  ? Procedure: ESOPHAGOGASTRODUODENOSCOPY (EGD) WITH PROPOFOL;  Surgeon: Doran Stabler, MD;  Location: [facility];  Service: Gastroenterology;  Laterality: N/A;  Fluoro-Savary  ? ORIF ANKLE FRACTURE Right 07/04/2015  ? Procedure: OPEN REDUCTION INTERNAL FIXATION (ORIF) ANKLE FRACTURE;  Surgeon: Newt Minion, MD;  Location: [facility];  Service: Orthopedics;  Laterality: Right;  OPEN REDUCTION, INTERNAL FIXATION OF RIGHT ANKLE FRACTURE.   ? ORIF ANKLE FRACTURE Left 02/10/2016  ? Procedure: OPEN  REDUCTION INTERNAL FIXATION (ORIF) ANKLE FRACTURE;  Surgeon: Newt Minion, MD;  Location: [facility];  Service: Orthopedics;  Laterality: Left;  ? SAVORY DILATION N/A 10/09/2014  ? Procedure: SAVORY DILATION;  Surgeon: Inda Castle, MD;  Location: [facility];  Service: Endoscopy;  Laterality: N/A;  ? SAVORY DILATION N/A 09/19/2016  ? Procedure: SAVORY DILATION;  Surgeon: Doran Stabler, MD;  Location: [facility];  Service: Gastroenterology;  Laterality: N/A;  ? SAVORY DILATION N/A 01/20/2020  ? Procedure: SAVORY DILATION;  Surgeon: Doran Stabler, MD;  Location: [facility];  Service: Gastroenterology;  Laterality: N/A;  ? TONSILLECTOMY    ? ?Social History  ? ?Social History  ? Marital Status: Divorced  ?  Spouse Name: N/A  ? Number of Children: 0  ? ?Occupational History  ? Retired Pharmacist, hospital Other  ? ?Social History Main Topics  ? Smoking status: Former Smoker -- 3.00 packs/day for 45 years  ?  Types: Cigarettes  ?  Quit date: 12/05/1997  ? Smokeless tobacco: Never Used  ? Alcohol Use: No  ? Drug Use: No  ? ?Social History Narrative  ? Patient lives at home alone.  ? Caffeine Use: 2 cups daily  ? ?Current Outpatient Medications on File Prior to Visit  ?Medication Sig Dispense Refill  ? albuterol (PROVENTIL HFA;VENTOLIN HFA) 108 (90 Base)  MCG/ACT inhaler Inhale 2 puffs into the lungs every 6 (six) hours as needed for wheezing or shortness of breath. 1 Inhaler 2  ? amLODipine (NORVASC) 10 MG tablet TAKE 1 TABLET(10 MG) BY MOUTH DAILY 90 tablet 1  ? au

## 2022-01-24 NOTE — Chronic Care Management (AMB) (Signed)
?Chronic Care Management  ? ?CCM RN Visit Note ? ?01/24/2022 ?Name: Megan [name] MRN: [id number] DOB: 09/13/1941 ? ?Subjective: ?Megan [name] is a 81 y.o. year old female who is a primary care patient of Burns, [name], MD. The care management team was consulted for assistance with disease management and care coordination needs.   ? ?Engaged with patient by telephone for follow up visit in response to provider referral for case management and/or care coordination services.  ? ?Consent to Services:  ?The patient was given information about Chronic Care Management services, agreed to services, and gave verbal consent prior to initiation of services.  Please see initial visit note for detailed documentation.  ?Patient agreed to services and verbal consent obtained.  ? ?Assessment: Review of patient past medical history, allergies, medications, health status, including review of consultants reports, laboratory and other test data, was performed as part of comprehensive evaluation and provision of chronic care management services.  ? ?SDOH (Social Determinants of Health) assessments and interventions performed:  ?SDOH Interventions   ? ?Flowsheet Row Most Recent Value  ?SDOH Interventions   ?Food Insecurity Interventions Intervention Not Indicated  [Denies food insecurity]  ?Housing Interventions Intervention Not Indicated  [Continues to live at [facility] facility]  ?Transportation Interventions Intervention Not Indicated  [Patient continues to drive self]  ? ?  ? ?CCM Care Plan ? ?Allergies  ?Allergen Reactions  ? Carboplatin Anaphylaxis, Shortness Of Breath and Other (See Comments)  ?  Neuropathy  ? Remicade [Infliximab] Anaphylaxis  ? Other   ? Rifampin Other (See Comments)  ?  unknown  ? Hydromorphone Rash  ? ?Outpatient Encounter Medications as of 01/24/2022  ?Medication Sig Note  ? albuterol (PROVENTIL HFA;VENTOLIN HFA) 108 (90 Base) MCG/ACT inhaler Inhale 2 puffs into the lungs  every 6 (six) hours as needed for wheezing or shortness of breath.   ? amLODipine (NORVASC) 10 MG tablet TAKE 1 TABLET(10 MG) BY MOUTH DAILY   ? augmented betamethasone dipropionate (DIPROLENE-AF) 0.05 % ointment    ? betamethasone dipropionate (DIPROLENE) 0.05 % cream Apply topically 2 (two) times daily. (Patient taking differently: Apply 1 application. topically 2 (two) times daily.)   ? budesonide-formoterol (SYMBICORT) 80-4.5 MCG/ACT inhaler Inhale 2 puffs into the lungs 2 (two) times daily. (Patient taking differently: Inhale 2 puffs into the lungs daily as needed (Wheezing).) 01/24/2022: Reports uses as needed  ? Continuous Blood Gluc Sensor (DEXCOM G6 SENSOR) MISC Apply new sensor every 10 days for continuous glucose monitoring.   ? Continuous Blood Gluc Transmit (DEXCOM G6 TRANSMITTER) MISC Use one transmitter every 3 months for continuous glucose monitoring.   ? DULoxetine (CYMBALTA) 60 MG capsule Take 1 capsule (60 mg total) by mouth 2 (two) times daily.   ? Fluocinolone Acetonide Scalp 0.01 % OIL    ? fluocinonide (LIDEX) 0.05 % external solution    ? GVOKE HYPOPEN 1-PACK 1 MG/0.2ML SOAJ Inject under skin 0.2 ml as needed for hypoglycemia 01/24/2022: Reports has not needed "in a long time"  ? insulin aspart (NOVOLOG FLEXPEN) 100 UNIT/ML FlexPen INJECT 18-22 UNITS UNDER THE SKIN THREE TIMES DAILY   ? insulin degludec (TRESIBA FLEXTOUCH) 100 UNIT/ML FlexTouch Pen Inject up to 36-38 units into the skin daily   ? Insulin Pen Needle (B-D UF III MINI PEN NEEDLES) 31G X 5 MM MISC USE AS DIRECTED WITH INSULIN 4 TIMES A DAY   ? ketoconazole (NIZORAL) 2 % cream    ? OTEZLA 30 MG TABS    ?  pravastatin (PRAVACHOL) 40 MG tablet TAKE 1 TABLET(40 MG) BY MOUTH EVERY EVENING   ? metoprolol tartrate (LOPRESSOR) 50 MG tablet  (Patient not taking: Reported on 01/24/2022)   ? solifenacin (VESICARE) 10 MG tablet Take 10 mg by mouth daily. (Patient not taking: Reported on 01/24/2022)   ? [DISCONTINUED] insulin aspart (NOVOLOG  FLEXPEN) 100 UNIT/ML FlexPen INJECT 12-18 UNITS UNDER THE SKIN THREE TIMES DAILY   ? [DISCONTINUED] insulin degludec (TRESIBA FLEXTOUCH) 100 UNIT/ML FlexTouch Pen Inject up to 32 units into the skin daily   ? ?No facility-administered encounter medications on file as of 01/24/2022.  ? ?Patient Active Problem List  ? Diagnosis Date Noted  ? Vitamin D deficiency 10/12/2021  ? Mixed hyperlipidemia 10/12/2021  ? Frequent UTI 10/12/2021  ? Aortic atherosclerosis ([place]) 10/09/2020  ? Fall 05/04/2020  ? Hx of noncompliance with medical treatment, presenting hazards to health 05/04/2020  ? Head pain 05/10/2019  ? GERD (gastroesophageal reflux disease) 10/17/2018  ? Herpes zoster without complication 62/69/4854  ? Right elbow pain 03/01/2018  ? Carpal tunnel syndrome on right 11/30/2017  ? Cough 08/22/2017  ? Hypokalemia 03/22/2017  ? Primary osteoarthritis of both hands 03/08/2017  ? Poor balance 02/07/2017  ? Subarachnoid bleed ([place]) 01/11/2017  ? Psoriatic arthropathy ([place]) 09/08/2016  ? Hypertension 09/08/2016  ? Diabetic neuropathy ([place]) 09/08/2016  ? LADA (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), managed as type 1 ([place]) 05/04/2016  ? Syncope 03/25/2016  ? RLS (restless legs syndrome) 12/11/2015  ? Dysuria 07/07/2015  ? Lumbago 05/06/2015  ? Peripheral edema 04/14/2015  ? Psoriasis 04/14/2015  ? Emphysema of lung ([place]) 11/26/2014  ? Radiation-induced esophageal stricture 10/01/2014  ? DNR (do not resuscitate) discussion 08/21/2014  ? Fatigue 07/18/2014  ? Neuropathy due to chemotherapeutic drug ([place]) 07/18/2014  ? Muscle cramping 07/18/2014  ? Dyspnea 04/28/2014  ? Radiation esophagitis 04/10/2014  ? Cancer of middle lobe of lung ([place]) 01/17/2014  ? Malignant neoplasm of right upper lobe of lung ([place]) 11/28/2013  ? ?Conditions to be addressed/monitored:  HTN, HLD, and DMII ? ?Care Plan : RN Care Manager Plan of Care  ?Updates made by Knox Royalty, RN since 01/24/2022 12:00 AM  ?  ? ?Problem: Chronic Disease Management  Needs   ?Priority: Medium  ?  ? ?Long-Range Goal: Ongoing adherence to established plan of care for long term chronic disease management   ?Start Date: 08/04/2021  ?Expected End Date: 08/04/2022  ?Note:   ?Current Barriers:  ?Chronic Disease Management support and education needs related to HTN and DMII ?History of frequent falls- no falls reported in > 2 years, but reports ongoing balance issues: does not routinely use assistive devices for ambulation ?01/24/22: reports today no new/ recent falls since last outreach 08/04/21; does not use assistive devices; currently participating in physical therapy at [facility] and goes to gym regularly- trying to preserve balance ? ?RNCM Clinical Goal(s):  ?Patient will demonstrate ongoing health management independence HTN; DMII  through collaboration with RN Care manager, provider, and care team.  ? ?Interventions: ?1:1 collaboration with primary care provider regarding development and update of comprehensive plan of care as evidenced by provider attestation and co-signature ?Inter-disciplinary care team collaboration (see longitudinal plan of care) ?Evaluation of current treatment plan related to  self management and patient's adherence to plan as established by provider ?CCM RN CM Initial assessment completed 08/04/22 ?01/24/22: ?Review of patient status, including review of consultants reports, relevant laboratory and other test results, and medications completed ?SDOH updated: no new/  unmet concerns identified ?Pain assessment updated: denies pain today; reports development of "arthritis joint pain," has started PT at [facility] and is attending appointments with sports medicine provider for knee injections as needed ?Falls assessment updated: denies new/ recent falls x > 12 months- does not use assistive devices;  positive reinforcement provided with encouragement to continue efforts at fall prevention; previously provided education around fall risks/ prevention  reinforced ?Confirmed patient had her cataract surgery as reported at last outreach 08/04/22- reports "did well;" denies concerns, problems post- surgery; stated she may need glasses for close-up vision, will follow up wit

## 2022-01-24 NOTE — Addendum Note (Signed)
Addended by: Lauralyn Primes on: 01/24/2022 09:30 AM ? ? Modules accepted: Orders ? ?

## 2022-01-26 ENCOUNTER — Ambulatory Visit: Payer: Medicare PPO | Admitting: Family Medicine

## 2022-01-26 VITALS — BP 128/80 | HR 100 | Ht 66.0 in | Wt 171.6 lb

## 2022-01-26 DIAGNOSIS — G8929 Other chronic pain: Secondary | ICD-10-CM

## 2022-01-26 DIAGNOSIS — M1712 Unilateral primary osteoarthritis, left knee: Secondary | ICD-10-CM | POA: Diagnosis not present

## 2022-01-26 DIAGNOSIS — M25562 Pain in left knee: Secondary | ICD-10-CM

## 2022-01-26 NOTE — Patient Instructions (Addendum)
Thank you for coming in today.  ? ?We will work to get approval from [employer] company for the gel shots. ? ?OK to take Tylenol arthritis ? ?You will hear from [facility] once we get the gel shots approved. ?

## 2022-01-26 NOTE — Progress Notes (Signed)
? ?  I, Peterson Lombard, LAT, ATC acting as a scribe for Lynne Leader, MD. ? ?Megan [name] is a 81 y.o. female who presents to [place] at [facility] today for f/u L knee pain and LBP. Pt was last seen by Dr. Georgina Snell on 12/29/21 and was given a L knee steroid injection and referred to PT [facility]. Today, pt reports not much relief from the prior steroid injection in her L knee. Pt has been working w/ PT for her LBP and feels it is helping a little bit.  ? ?Dx imaging: 31/23 L knee & L-spine XR ?07/22/14 L knee XR ? ?Pertinent review of systems: No fevers or chills ? ?Relevant historical information: History of lung cancer.  Diabetes ? ? ?Exam:  ?BP 128/80   Pulse 100   Ht 5\' 6"  (1.676 m)   Wt 171 lb 9.6 oz (77.8 kg)   SpO2 96%   BMI 27.70 kg/m?  ?General: Well Developed, well nourished, and in no acute distress.  ? ?MSK: Left knee mild effusion normal motion with crepitation.  Tender palpation medial joint line. ? ? ? ?Lab and Radiology Results ? ?EXAM: ?LEFT KNEE 3 VIEWS ?  ?COMPARISON:  None. ?  ?FINDINGS: ?Normal alignment. No acute fracture or dislocation. Mild ?bicompartmental degenerative arthritis, most severe within the ?medial compartment. Lateral compartment joint space appears ?preserved. Trace left knee effusion. Soft tissues are unremarkable. ?  ?IMPRESSION: ?Mild bicompartmental degenerative arthritis.  Trace effusion. ?  ?  ?Electronically Signed ?  By: Fidela Salisbury M.D. ?  On: 12/31/2021 00:20 ?  ?I, Lynne Leader, personally (independently) visualized and performed the interpretation of the images attached in this note. ? ? ? ? ?Assessment and Plan: ?81 y.o. female with left knee pain primarily due to DJD.  Patient had a steroid injection about a month ago with minimal benefit.  Plan to work on authorization for hyaluronic acid injections.  We will recheck once the gel shots are authorized. ? ?Discussed treatment plan and options going forward.  She would  like to avoid a knee replacement if possible. ? ? ? ? ?Discussed warning signs or symptoms. Please see discharge instructions. Patient expresses understanding. ? ? ?The above documentation has been reviewed and is accurate and complete Lynne Leader, M.D. ? ? ?

## 2022-01-27 ENCOUNTER — Telehealth: Payer: Self-pay

## 2022-01-27 DIAGNOSIS — E1149 Type 2 diabetes mellitus with other diabetic neurological complication: Secondary | ICD-10-CM | POA: Diagnosis not present

## 2022-01-27 NOTE — Telephone Encounter (Signed)
Spoke to pt to informed her she's approved for the gel shots for her knee. Pt reported she will call back to schedule a visit. ?

## 2022-01-28 DIAGNOSIS — I10 Essential (primary) hypertension: Secondary | ICD-10-CM | POA: Diagnosis not present

## 2022-01-28 DIAGNOSIS — E1169 Type 2 diabetes mellitus with other specified complication: Secondary | ICD-10-CM

## 2022-01-28 DIAGNOSIS — Z794 Long term (current) use of insulin: Secondary | ICD-10-CM | POA: Diagnosis not present

## 2022-01-28 DIAGNOSIS — E785 Hyperlipidemia, unspecified: Secondary | ICD-10-CM | POA: Diagnosis not present

## 2022-02-01 DIAGNOSIS — B351 Tinea unguium: Secondary | ICD-10-CM | POA: Diagnosis not present

## 2022-02-03 ENCOUNTER — Other Ambulatory Visit (HOSPITAL_COMMUNITY): Payer: Self-pay

## 2022-02-03 ENCOUNTER — Telehealth: Payer: Self-pay

## 2022-02-03 NOTE — Telephone Encounter (Signed)
Tried to submit a PA for this item and it says PA not needed. Pt next payable fill is on 4.22.23. Will submit another PA if requested after this fill at the pharmacy.  ? ? ?

## 2022-02-03 NOTE — Telephone Encounter (Signed)
Pt received a letter from insurance advising her Megan [name] was not covered under her formulary. Letter advised a prior auth was needed for further refills. ?

## 2022-02-09 DIAGNOSIS — R5383 Other fatigue: Secondary | ICD-10-CM | POA: Diagnosis not present

## 2022-02-09 DIAGNOSIS — M25562 Pain in left knee: Secondary | ICD-10-CM | POA: Diagnosis not present

## 2022-02-09 DIAGNOSIS — R2689 Other abnormalities of gait and mobility: Secondary | ICD-10-CM | POA: Diagnosis not present

## 2022-02-09 DIAGNOSIS — R2681 Unsteadiness on feet: Secondary | ICD-10-CM | POA: Diagnosis not present

## 2022-02-11 ENCOUNTER — Other Ambulatory Visit: Payer: Self-pay

## 2022-02-11 ENCOUNTER — Ambulatory Visit (HOSPITAL_COMMUNITY)
Admission: RE | Admit: 2022-02-11 | Discharge: 2022-02-11 | Disposition: A | Discharge status: Home / Self Care | Payer: Medicare PPO | Source: Ambulatory Visit | Attending: Internal Medicine | Admitting: Internal Medicine

## 2022-02-11 ENCOUNTER — Inpatient Hospital Stay: Payer: Medicare PPO | Attending: Internal Medicine

## 2022-02-11 DIAGNOSIS — I10 Essential (primary) hypertension: Secondary | ICD-10-CM | POA: Insufficient documentation

## 2022-02-11 DIAGNOSIS — Z923 Personal history of irradiation: Secondary | ICD-10-CM | POA: Insufficient documentation

## 2022-02-11 DIAGNOSIS — Z7951 Long term (current) use of inhaled steroids: Secondary | ICD-10-CM | POA: Insufficient documentation

## 2022-02-11 DIAGNOSIS — Z9221 Personal history of antineoplastic chemotherapy: Secondary | ICD-10-CM | POA: Insufficient documentation

## 2022-02-11 DIAGNOSIS — J8410 Pulmonary fibrosis, unspecified: Secondary | ICD-10-CM | POA: Diagnosis not present

## 2022-02-11 DIAGNOSIS — I700 Atherosclerosis of aorta: Secondary | ICD-10-CM | POA: Insufficient documentation

## 2022-02-11 DIAGNOSIS — C3490 Malignant neoplasm of unspecified part of unspecified bronchus or lung: Secondary | ICD-10-CM | POA: Insufficient documentation

## 2022-02-11 DIAGNOSIS — Z85118 Personal history of other malignant neoplasm of bronchus and lung: Secondary | ICD-10-CM | POA: Diagnosis not present

## 2022-02-11 DIAGNOSIS — E1142 Type 2 diabetes mellitus with diabetic polyneuropathy: Secondary | ICD-10-CM | POA: Insufficient documentation

## 2022-02-11 DIAGNOSIS — L4050 Arthropathic psoriasis, unspecified: Secondary | ICD-10-CM | POA: Insufficient documentation

## 2022-02-11 DIAGNOSIS — Z79899 Other long term (current) drug therapy: Secondary | ICD-10-CM | POA: Insufficient documentation

## 2022-02-11 DIAGNOSIS — Z794 Long term (current) use of insulin: Secondary | ICD-10-CM | POA: Insufficient documentation

## 2022-02-11 DIAGNOSIS — K219 Gastro-esophageal reflux disease without esophagitis: Secondary | ICD-10-CM | POA: Insufficient documentation

## 2022-02-11 DIAGNOSIS — J9811 Atelectasis: Secondary | ICD-10-CM | POA: Diagnosis not present

## 2022-02-11 DIAGNOSIS — M069 Rheumatoid arthritis, unspecified: Secondary | ICD-10-CM | POA: Insufficient documentation

## 2022-02-11 DIAGNOSIS — I2510 Atherosclerotic heart disease of native coronary artery without angina pectoris: Secondary | ICD-10-CM | POA: Insufficient documentation

## 2022-02-11 LAB — CBC WITH DIFFERENTIAL (CANCER CENTER ONLY)
Abs Immature Granulocytes: 0.22 10*3/uL — ABNORMAL HIGH (ref 0.00–0.07)
Basophils Absolute: 0.1 10*3/uL (ref 0.0–0.1)
Basophils Relative: 1 %
Eosinophils Absolute: 0.4 10*3/uL (ref 0.0–0.5)
Eosinophils Relative: 5 %
HCT: 43.3 % (ref 36.0–46.0)
Hemoglobin: 13.8 g/dL (ref 12.0–15.0)
Immature Granulocytes: 2 %
Lymphocytes Relative: 15 %
Lymphs Abs: 1.4 10*3/uL (ref 0.7–4.0)
MCH: 28 pg (ref 26.0–34.0)
MCHC: 31.9 g/dL (ref 30.0–36.0)
MCV: 87.8 fL (ref 80.0–100.0)
Monocytes Absolute: 0.6 10*3/uL (ref 0.1–1.0)
Monocytes Relative: 6 %
Neutro Abs: 6.4 10*3/uL (ref 1.7–7.7)
Neutrophils Relative %: 71 %
Platelet Count: 217 10*3/uL (ref 150–400)
RBC: 4.93 MIL/uL (ref 3.87–5.11)
RDW: 14.6 % (ref 11.5–15.5)
WBC Count: 9.2 10*3/uL (ref 4.0–10.5)
nRBC: 0 % (ref 0.0–0.2)

## 2022-02-11 LAB — CMP (CANCER CENTER ONLY)
ALT: 11 U/L (ref 0–44)
AST: 13 U/L — ABNORMAL LOW (ref 15–41)
Albumin: 3.9 g/dL (ref 3.5–5.0)
Alkaline Phosphatase: 127 U/L — ABNORMAL HIGH (ref 38–126)
Anion gap: 5 (ref 5–15)
BUN: 15 mg/dL (ref 8–23)
CO2: 31 mmol/L (ref 22–32)
Calcium: 10.4 mg/dL — ABNORMAL HIGH (ref 8.9–10.3)
Chloride: 100 mmol/L (ref 98–111)
Creatinine: 0.93 mg/dL (ref 0.44–1.00)
GFR, Estimated: >60 mL/min (ref >60)
Glucose, Bld: 364 mg/dL — ABNORMAL HIGH (ref 70–99)
Potassium: 4.1 mmol/L (ref 3.5–5.1)
Sodium: 136 mmol/L (ref 135–145)
Total Bilirubin: 0.5 mg/dL (ref 0.3–1.2)
Total Protein: 7 g/dL (ref 6.5–8.1)

## 2022-02-11 MED ORDER — IOHEXOL 300 MG/ML  SOLN
75.0000 mL | Freq: Once | INTRAMUSCULAR | Status: AC | PRN
  Administered 2022-02-11: 75 mL via INTRAVENOUS

## 2022-02-15 ENCOUNTER — Encounter (INDEPENDENT_AMBULATORY_CARE_PROVIDER_SITE_OTHER): Payer: Self-pay

## 2022-02-15 ENCOUNTER — Other Ambulatory Visit: Payer: Self-pay

## 2022-02-15 ENCOUNTER — Inpatient Hospital Stay: Payer: Medicare PPO | Admitting: Internal Medicine

## 2022-02-15 ENCOUNTER — Encounter: Payer: Self-pay | Admitting: Internal Medicine

## 2022-02-15 VITALS — BP 121/69 | HR 106 | Temp 98.0°F | Resp 19 | Ht 66.0 in | Wt 172.7 lb

## 2022-02-15 DIAGNOSIS — I2510 Atherosclerotic heart disease of native coronary artery without angina pectoris: Secondary | ICD-10-CM | POA: Diagnosis not present

## 2022-02-15 DIAGNOSIS — L4050 Arthropathic psoriasis, unspecified: Secondary | ICD-10-CM | POA: Diagnosis not present

## 2022-02-15 DIAGNOSIS — C3490 Malignant neoplasm of unspecified part of unspecified bronchus or lung: Secondary | ICD-10-CM | POA: Diagnosis not present

## 2022-02-15 DIAGNOSIS — C3411 Malignant neoplasm of upper lobe, right bronchus or lung: Secondary | ICD-10-CM

## 2022-02-15 DIAGNOSIS — Z9221 Personal history of antineoplastic chemotherapy: Secondary | ICD-10-CM | POA: Diagnosis not present

## 2022-02-15 DIAGNOSIS — Z7951 Long term (current) use of inhaled steroids: Secondary | ICD-10-CM | POA: Diagnosis not present

## 2022-02-15 DIAGNOSIS — K219 Gastro-esophageal reflux disease without esophagitis: Secondary | ICD-10-CM | POA: Diagnosis not present

## 2022-02-15 DIAGNOSIS — Z794 Long term (current) use of insulin: Secondary | ICD-10-CM | POA: Diagnosis not present

## 2022-02-15 DIAGNOSIS — I700 Atherosclerosis of aorta: Secondary | ICD-10-CM | POA: Diagnosis not present

## 2022-02-15 DIAGNOSIS — Z923 Personal history of irradiation: Secondary | ICD-10-CM | POA: Diagnosis not present

## 2022-02-15 DIAGNOSIS — E1142 Type 2 diabetes mellitus with diabetic polyneuropathy: Secondary | ICD-10-CM | POA: Diagnosis not present

## 2022-02-15 DIAGNOSIS — M069 Rheumatoid arthritis, unspecified: Secondary | ICD-10-CM | POA: Diagnosis not present

## 2022-02-15 DIAGNOSIS — Z79899 Other long term (current) drug therapy: Secondary | ICD-10-CM | POA: Diagnosis not present

## 2022-02-15 DIAGNOSIS — I10 Essential (primary) hypertension: Secondary | ICD-10-CM | POA: Diagnosis not present

## 2022-02-15 NOTE — Progress Notes (Signed)
?    [facility] ?[phone]   [phone] ? ?OFFICE PROGRESS NOTE ? ?Binnie Rail, MD ?[place] [address] ? ?DIAGNOSIS: Squamous cell lung cancer   ?Primary site: Lung (Right)   ?Staging method: AJCC 7th Edition   ?Clinical: Stage IIIB (T3, N3, M0)   ?Summary: Stage IIIB (T3, N3, M0) diagnosed in January 2015. ? ?PRIOR THERAPY:   ?1) Concurrent chemoradiation with weekly chemotherapy in the form of carboplatin for AUC of 2 and paclitaxel 45 mg/m2. Status post 7 week of treatment. Last dose was given 01/27/2014 no significant response in her disease.   ?2) Consolidation chemotherapy with carboplatin for AUC of 5 and paclitaxel 175 mg/M2 every 3 weeks with Neulasta support. First dose on 04/15/2014. Status post 3 cycles. Carboplatin was discontinued starting cycle #2 secondary to hypersensitivity reaction. ?3) Immunotherapy with Opdivo (Nivolumab) 3mg /kg given every 2 weeks. Status post 33 cycles discontinued secondary to intolerance. ? ?CURRENT THERAPY: Observation. ? ?INTERVAL HISTORY: ?Megan [name] 81 y.o. female returns to the clinic today for annual follow-up visit.  The patient is feeling fine today with no concerning complaints.  She denied having any current chest pain, shortness of breath, cough or hemoptysis.  She has no nausea, vomiting, diarrhea or constipation.  She has no headache or visual changes.  She has no recent weight loss or night sweats.  She is here today for evaluation with repeat CT scan of the chest for restaging of her disease. ? ?MEDICAL HISTORY: ?Past Medical History:  ?Diagnosis Date  ? Cancer [facility]) dx'd 09/2013  ? Lung ca  ? Concussion 2018  ? Diabetes ([place]) 09/08/2016  ? type I diabetic patient reported on 09/13/16   ? Diabetes mellitus   ? insulin  ? Diabetic neuropathy ([place]) 09/08/2016  ? Fall 2018  ? GERD (gastroesophageal reflux disease)   ? no meds for  ? History of colitis   ? History of hiatal hernia   ? Hx of  radiation therapy 12/16/13-01/30/14  ? lung 66Gy  ? Hypertension   ? Malignant neoplasm of right upper lobe of lung ([place]) 11/28/2013  ? Neuropathy   ? Psoriasis   ? Psoriatic arthritis ([place]) 09/08/2016  ? Rheumatoid arthritis ([place])   ? Shortness of breath dyspnea   ? with exertion  ? ? ?ALLERGIES:  is allergic to carboplatin, remicade [infliximab], other, rifampin, and hydromorphone. ? ?MEDICATIONS:  ?Current Outpatient Medications  ?Medication Sig Dispense Refill  ? albuterol (PROVENTIL HFA;VENTOLIN HFA) 108 (90 Base) MCG/ACT inhaler Inhale 2 puffs into the lungs every 6 (six) hours as needed for wheezing or shortness of breath. 1 Inhaler 2  ? amLODipine (NORVASC) 10 MG tablet TAKE 1 TABLET(10 MG) BY MOUTH DAILY 90 tablet 1  ? augmented betamethasone dipropionate (DIPROLENE-AF) 0.05 % ointment     ? betamethasone dipropionate (DIPROLENE) 0.05 % cream Apply topically 2 (two) times daily. (Patient taking differently: Apply 1 application. topically 2 (two) times daily.) 45 g 0  ? budesonide-formoterol (SYMBICORT) 80-4.5 MCG/ACT inhaler Inhale 2 puffs into the lungs 2 (two) times daily. (Patient taking differently: Inhale 2 puffs into the lungs daily as needed (Wheezing).) 1 Inhaler 6  ? Continuous Blood Gluc Sensor (DEXCOM G6 SENSOR) MISC Apply new sensor every 10 days for continuous glucose monitoring. 9 each 3  ? Continuous Blood Gluc Transmit (DEXCOM G6 TRANSMITTER) MISC Use one transmitter every 3 months for continuous glucose monitoring. 1 each 0  ? DULoxetine (CYMBALTA) 60 MG  capsule Take 1 capsule (60 mg total) by mouth 2 (two) times daily. 180 capsule 0  ? Fluocinolone Acetonide Scalp 0.01 % OIL     ? fluocinonide (LIDEX) 0.05 % external solution     ? GVOKE HYPOPEN 1-PACK 1 MG/0.2ML SOAJ Inject under skin 0.2 ml as needed for hypoglycemia 0.2 mL prn  ? insulin aspart (NOVOLOG FLEXPEN) 100 UNIT/ML FlexPen INJECT 18-22 UNITS UNDER THE SKIN THREE TIMES DAILY 45 mL 3  ? insulin degludec (TRESIBA FLEXTOUCH) 100 UNIT/ML  FlexTouch Pen Inject up to 36-38 units into the skin daily 30 mL 3  ? Insulin Pen Needle (B-D UF III MINI PEN NEEDLES) 31G X 5 MM MISC USE AS DIRECTED WITH INSULIN 4 TIMES A DAY 100 each 3  ? ketoconazole (NIZORAL) 2 % cream     ? metoprolol tartrate (LOPRESSOR) 50 MG tablet  (Patient not taking: Reported on 01/24/2022)    ? OTEZLA 30 MG TABS     ? pravastatin (PRAVACHOL) 40 MG tablet TAKE 1 TABLET(40 MG) BY MOUTH EVERY EVENING 90 tablet 1  ? solifenacin (VESICARE) 10 MG tablet Take 10 mg by mouth daily. (Patient not taking: Reported on 01/24/2022)    ? ?No current facility-administered medications for this visit.  ? ? ?SURGICAL HISTORY:  ?Past Surgical History:  ?Procedure Laterality Date  ? ABDOMINAL HYSTERECTOMY    ? ABDOMINAL SURGERY    ? BALLOON DILATION N/A 12/12/2014  ? Procedure: BALLOON DILATION;  Surgeon: Inda Castle, MD;  Location: [facility];  Service: Endoscopy;  Laterality: N/A;  ? BIOPSY  01/20/2020  ? Procedure: BIOPSY;  Surgeon: Doran Stabler, MD;  Location: [facility];  Service: Gastroenterology;;  ? CHOLECYSTECTOMY    ? COLONOSCOPY N/A 02/07/2016  ? Procedure: COLONOSCOPY;  Surgeon: Doran Stabler, MD;  Location: [facility];  Service: Endoscopy;  Laterality: N/A;  ? DILATION AND CURETTAGE OF UTERUS    ? ESOPHAGOGASTRODUODENOSCOPY N/A 12/12/2014  ? Procedure: ESOPHAGOGASTRODUODENOSCOPY (EGD);  Surgeon: Inda Castle, MD;  Location: [facility];  Service: Endoscopy;  Laterality: N/A;  with dilation  ? ESOPHAGOGASTRODUODENOSCOPY (EGD) WITH PROPOFOL N/A 10/09/2014  ? Procedure: ESOPHAGOGASTRODUODENOSCOPY (EGD) WITH PROPOFOL;  Surgeon: Inda Castle, MD;  Location: [facility];  Service: Endoscopy;  Laterality: N/A;  ? ESOPHAGOGASTRODUODENOSCOPY (EGD) WITH PROPOFOL N/A 09/19/2016  ? Procedure: ESOPHAGOGASTRODUODENOSCOPY (EGD) WITH PROPOFOL;  Surgeon: Doran Stabler, MD;  Location: [facility];  Service: Gastroenterology;  Laterality: N/A;  with savary dil tt  ?  ESOPHAGOGASTRODUODENOSCOPY (EGD) WITH PROPOFOL N/A 01/20/2020  ? Procedure: ESOPHAGOGASTRODUODENOSCOPY (EGD) WITH PROPOFOL;  Surgeon: Doran Stabler, MD;  Location: [facility];  Service: Gastroenterology;  Laterality: N/A;  Fluoro-[place]  ? ORIF ANKLE FRACTURE Right 07/04/2015  ? Procedure: OPEN REDUCTION INTERNAL FIXATION (ORIF) ANKLE FRACTURE;  Surgeon: Newt Minion, MD;  Location: [facility];  Service: Orthopedics;  Laterality: Right;  OPEN REDUCTION, INTERNAL FIXATION OF RIGHT ANKLE FRACTURE.   ? ORIF ANKLE FRACTURE Left 02/10/2016  ? Procedure: OPEN REDUCTION INTERNAL FIXATION (ORIF) ANKLE FRACTURE;  Surgeon: Newt Minion, MD;  Location: [facility];  Service: Orthopedics;  Laterality: Left;  ? SAVORY DILATION N/A 10/09/2014  ? Procedure: SAVORY DILATION;  Surgeon: Inda Castle, MD;  Location: [facility];  Service: Endoscopy;  Laterality: N/A;  ? SAVORY DILATION N/A 09/19/2016  ? Procedure: SAVORY DILATION;  Surgeon: Doran Stabler, MD;  Location: [facility];  Service: Gastroenterology;  Laterality: N/A;  ? SAVORY DILATION N/A 01/20/2020  ?  Procedure: SAVORY DILATION;  Surgeon: Doran Stabler, MD;  Location: [facility];  Service: Gastroenterology;  Laterality: N/A;  ? TONSILLECTOMY    ? ? ?REVIEW OF SYSTEMS:  A comprehensive review of systems was negative.  ? ?PHYSICAL EXAMINATION: General appearance: alert, cooperative and no distress ?Head: Normocephalic, without obvious abnormality, atraumatic ?Neck: no adenopathy, no JVD, supple, symmetrical, trachea midline and thyroid not enlarged, symmetric, no tenderness/mass/nodules ?Lymph nodes: Cervical, supraclavicular, and axillary nodes normal. ?Resp: clear to auscultation bilaterally ?Back: symmetric, no curvature. ROM normal. No CVA tenderness. ?Cardio: regular rate and rhythm, S1, S2 normal, no murmur, click, rub or gallop ?GI: soft, non-tender; bowel sounds normal; no masses,  no organomegaly ?Extremities: extremities normal, atraumatic, no cyanosis or  edema  ?  ?ECOG PERFORMANCE STATUS: 1 - Symptomatic but completely ambulatory ? ?Blood pressure 121/69, pulse (!) 106, resp. rate 19, height 5\' 6"  (1.676 m), weight 172 lb 11.2 oz (78.3 kg), SpO2 97 %. ? ?LABORATORY DATA

## 2022-02-22 DIAGNOSIS — B351 Tinea unguium: Secondary | ICD-10-CM | POA: Diagnosis not present

## 2022-02-23 ENCOUNTER — Ambulatory Visit (INDEPENDENT_AMBULATORY_CARE_PROVIDER_SITE_OTHER): Payer: Self-pay | Admitting: Neurology

## 2022-02-23 DIAGNOSIS — G620 Drug-induced polyneuropathy: Secondary | ICD-10-CM

## 2022-02-23 DIAGNOSIS — T451X5A Adverse effect of antineoplastic and immunosuppressive drugs, initial encounter: Secondary | ICD-10-CM

## 2022-02-23 NOTE — Progress Notes (Signed)
?[facility] ? ? ? ?Provider:  Dr Jaynee Eagles ?Referring Provider: Binnie Rail, MD ?Primary Care Physician:  Binnie Rail, MD  ? ?02/23/2022: Patient doing well, she does not have to come to appointmentin office, we spoke otp, I will fill prescriptions if she needs it, spoke otp, no charge.  ? ?04/29/2020: This is a wonderful 81 years old white female with history of stage IIIB non-small cell lung cancer status postcarboplatin and paclitaxel with resultant painful neuropathy.I've treated her in the past for neuropathy.  She is here for follow-up after hospitalization, she has a past medical history of lung cancer, obstructive sleep apnea, hypertension, polyneuropathy, GERD, hypertension, type 1 diabetes currently on insulin therapy with noncompliance to dietary restrictions, I reviewed notes from her admission and patient was admitted for DKA management/hyperglycemia she was found at home lying in bed unresponsive, to be in acute DKA with increased somnolence and subsequent lactic acid doses and acute metabolic encephalopathy as well as hyponatremia and admitted to the ICU. ? ? ? Today she is her for new problems, she has had multiple falls and she even knocked herself out and there was a lot of blood. Today her BP is 78/48 and I recommended the ED but she declines. Started 5 years ago with syncope, they changed her blood pressure medication and she became better, she had labile blood glucose after that. She has been going to [facility], she was in the hospital from May to June 5th, I don;t have the records unfortunately. She went into the hospital because she fell, she hit her head, she got up out of bed and just got out of bed and passed out. She was taken to [facility] and sent to the ICU because of her vital signs, she cannot give me much informtion, they mentioned they thought the low glucose caused the fall, , her balance is poor, she is going down hill, worsening neuropathy in the left hand  - in 2019 emg/ncs showed right CTS and left Ulnar neuropathy. Physical therapy for imbalance. She had 5 recent falls but not since eaving the hospital. Losing balance, also passed out when getting up quickly.  Denies any new weakness. Reviewed MRi report 07/2019 w/wo contrast (this is prior to these new symptoms) see below. ? ?[facility] also called [place], [place]: requested records ? ?Addendum: received records from [place]: Patient is in the hospital for over a week, she was admitted at the end of November and discharged the beginning of June.   ? ?Reviewed CT of the head without contrast 03/25/2020 - for acute intracranial pathology, mild diffuse cerebral and cerebellar atrophy, slight periventricular white matter change. ? ?Chest XR: 03/25/2020 stable chronic atelectasis or scarring medial lower right lung, lungs otherwise clear, stable chest no evidence for acute process. ? ?Review of MRI of the brain report: 03/27/2020; mild diffuse parenchymal volume loss, ventricles normal in size, no midline shift, no evidence of intracranial hemorrhage, nothing to suggest acute infarct, mass no normal enhancement.  Impression: No acute intracranial hemorrhage or abnormal enhancement, mild diffuse volume loss.  ? ?Review of CT of the abdomen and pelvis due to abdominal distention 03/27/2020: Nonobstructive bowel gas pattern, no evidence of pneumatosis or portal vein gas, no renal stone or hydronephrosis, moderate stool throughout may reflect constipation, small nonspecific hypodense lesions in the liver may reflect cysts hemangiomas or possibly metastatic disease in this patient with history of lung cancer, moderate degenerative changes both hips and lumbar spine.  Cholecystectomy clips are present, mild levoconvex lumbar probation, degenerative changes of the spine and hips; impression: Nonobstructive bowel gas pattern. ? ?Review of ultrasound right upper quadrant gallbladder report 10/18/2019: Diffuse  fatty infiltration of the liver, 2 small hepatic cysts, cholecystectomy, no biliary ductal dilatation. ? ?Review of lumbar puncture, 03/26/2020: Indication for toxic encephalopathy, clear CSF return through the needle, opening pressure 25 cm of water, hemostasis was achieved, no immediate complications.  8 mm clear CSF was collected and placed into 4 tubes. ? ?FINDINGS:  ?#  Ventricles: Normal without dilatation, mass effect, or midline shift.  ?#  Brain parenchyma: Mild cerebral atrophy. There are 2 or 3 small nonspecific T2 hyperintensities in the white matter, essentially within normal limits for patient of this age. These are of doubtful significance.  Diffusion-weighted images are normal  ?indicating no acute infarct.  No evidence mass or hemorrhage.  There is a developmental venous anomaly in the right cerebellar hemisphere. This is of no clinical significance. No additional area of abnormal enhancement.  ?#  Sella and parasellar region: Unremarkable  ?#  Craniovertebral junction: Normal.  ?#  Mastoids and paranasal sinuses: Unremarkable  ?#  Orbits: Unremarkable  ?#  Major cerebral vessels and dural sinuses: Normal flow voids  ?#  Bony structures and scalp: Unremarkable  ? ? ?IMPRESSION:  ?Mild cerebral atrophy. Small developmental venous anomaly in the right cerebellar hemisphere. Otherwise essentially normal MRI of the brain ? ? ?HPI 07/10/2019: This is a wonderful 81 years old white female with history of stage IIIB non-small cell lung cancer status postcarboplatin and paclitaxel with resultant painful neuropathy.I've treated her in the past for neuropathy but here with new onset headaches. She has a flicker but no pain, last 30 secinds to a minute, they start at the temple ans shoots to the eye, sometimes on top, sometimes on the right, sometime in the back of the head, they are gentle, she has had 1-2 stabs but nothing significant, no hx of migraines in the family. Started in March right around the  pandemic. No new medications. Nothing has changed. No inciting events. It can come and go for a few days. Eyes get very tired, no eye drooping, no lacrimation or rhinorrhea, her eyes can get very tired, she feels her eyes hurt. She is having symptoms every day multiple times a day.  ? ? ?CT 2019 showed No acute intracranial abnormalities including mass lesion or mass effect, hydrocephalus, extra-axial fluid collection, midline shift, hemorrhage, or acute infarction, large ischemic events (personally reviewed images) ? ? ? ?HPI:  This is a wonderful 81 years old white female with history of stage IIIB non-small cell lung cancer status postcarboplatin and paclitaxel with resultant painful neuropathy. She is still on the Lyrica. Her left hand feels "sandy", all the fingers, feels like sand paper, she feels weakness in her left hand and is dropping, she has APB muscle wasting. Feet are stable, numb, no significant pain. New issue is the left hand symptoms. ?  ?Interval history 10/26/2016: She is here for painful neuropathy. Her feet are numb. Not much pain in the feet anymore. She has pain in the hands. She can grip things and has some strength but the left hand is worse than the right. She has done well on the medications but today would like to try Lyrica instead of long-acting gabapentin. She is on 1200-1800mg  of long-acting gabapentin so will start her at a higher dose of Lyrica 150mg  twice daily. Discussed side effects.  ?  ?  ?  Interval History 08/12/2015: She has fallen and broken several bones in the right leg. She in a cast currently. She had to go to skilled nursing for several weeks. Her legs just buckled and she fell. It was late at night, she got up to go to the bathroom before she went to sleep. She got lightheaded and she crashed onher ankle. She crawled over to the phone and called 911 and went to the emergency room. She was Dxed with low blood pressure. At a later incident, she was walking around the bed  and just went down, no LOC, no SOB, no CP. Not lightheaded. Her legs just buckled, both of them, did not trip. No weakness, she can get out of low seats, she can walk up stairs, no new weakness. She was very

## 2022-03-02 ENCOUNTER — Telehealth: Payer: Self-pay | Admitting: Internal Medicine

## 2022-03-02 NOTE — Telephone Encounter (Signed)
Called and left message for patient on answering machine. If she calls back please have her make appointment if she wants the medication and if she doesn't want medication please still schedule appointment for June. ? ?Ok to let her know Dr. Quay Burow recommendations.  ?

## 2022-03-02 NOTE — Telephone Encounter (Signed)
Pt states she was treated by Dr. Fredna Dow for finger nail yeast or fungus ? ?Pt states she was advice by Dr. Fredna Dow to call her provider and request a rx for itracorazole or slucorazole to treat the yeast/ fungus ? ?Inquired why Dr could not prescribe medication, pt states she does not know ? ?Pharmacy [id number] - [place], [place] ? ?

## 2022-03-02 NOTE — Telephone Encounter (Signed)
She should be coming in for a follow-up appointment in [place] will likely be seen every 6 months, but if she does want to consider this medication she needs to come in to discuss it because there are side effects and risks with it and she may not be a good candidate for it. ?

## 2022-03-03 ENCOUNTER — Encounter: Payer: Self-pay | Admitting: Internal Medicine

## 2022-03-03 DIAGNOSIS — B351 Tinea unguium: Secondary | ICD-10-CM | POA: Insufficient documentation

## 2022-03-03 NOTE — Progress Notes (Signed)
? ? ?Subjective:  ? ? Patient ID: Megan [name], female    DOB: [birth year], 81 y.o.   MRN: [id number] ? ?This visit occurred during the SARS-CoV-2 public health emergency.  Safety protocols were in place, including screening questions prior to the visit, additional usage of staff PPE, and extensive cleaning of exam room while observing appropriate contact time as indicated for disinfecting solutions. ? ? ? ?HPI ?[name] is here for  ?Chief Complaint  ?Patient presents with  ? Hypertension  ? Nail Problem  ? ? ?She has onychomycosis of her finger.  She has seen orthopedics.  Topical treatments have not been effective.  Dr Fredna Dow advised either itraconazole or fluconazole for treatment and wanted me to prescribe.  She did have a biopsy that confirmed candidia.  The only finger involved is the left ring finger. ? ?She is taking her medication as prescribed.  She would like to get a refill of her cholesterol medication.  She does not feel that she needs to see the cardiologist and would prefer me to refill. ? ?Does not sleep good - gts up every 2 hrs to sleep.    Had med from dr Roni Bread - helped during day. Stopped med.  2 times during day.  More at night.  1/2 cup coffee in am.  Drinks ice tea for dinner.   ? ? ?Medications and allergies reviewed with patient and updated if appropriate. ? ?Current Outpatient Medications on File Prior to Visit  ?Medication Sig Dispense Refill  ? albuterol (PROVENTIL HFA;VENTOLIN HFA) 108 (90 Base) MCG/ACT inhaler Inhale 2 puffs into the lungs every 6 (six) hours as needed for wheezing or shortness of breath. 1 Inhaler 2  ? amLODipine (NORVASC) 10 MG tablet TAKE 1 TABLET(10 MG) BY MOUTH DAILY 90 tablet 1  ? augmented betamethasone dipropionate (DIPROLENE-AF) 0.05 % ointment     ? betamethasone dipropionate (DIPROLENE) 0.05 % cream Apply topically 2 (two) times daily. (Patient taking differently: Apply 1 application. topically 2 (two) times daily.) 45 g 0  ? budesonide-formoterol  (SYMBICORT) 80-4.5 MCG/ACT inhaler Inhale 2 puffs into the lungs 2 (two) times daily. (Patient taking differently: Inhale 2 puffs into the lungs daily as needed (Wheezing).) 1 Inhaler 6  ? Continuous Blood Gluc Sensor (DEXCOM G6 SENSOR) MISC Apply new sensor every 10 days for continuous glucose monitoring. 9 each 3  ? Continuous Blood Gluc Transmit (DEXCOM G6 TRANSMITTER) MISC Use one transmitter every 3 months for continuous glucose monitoring. 1 each 0  ? DULoxetine (CYMBALTA) 60 MG capsule Take 1 capsule (60 mg total) by mouth 2 (two) times daily. 180 capsule 0  ? Fluocinolone Acetonide Scalp 0.01 % OIL     ? fluocinonide (LIDEX) 0.05 % external solution     ? GVOKE HYPOPEN 1-PACK 1 MG/0.2ML SOAJ Inject under skin 0.2 ml as needed for hypoglycemia 0.2 mL prn  ? insulin aspart (NOVOLOG FLEXPEN) 100 UNIT/ML FlexPen INJECT 18-22 UNITS UNDER THE SKIN THREE TIMES DAILY 45 mL 3  ? insulin degludec (TRESIBA FLEXTOUCH) 100 UNIT/ML FlexTouch Pen Inject up to 36-38 units into the skin daily 30 mL 3  ? Insulin Pen Needle (B-D UF III MINI PEN NEEDLES) 31G X 5 MM MISC USE AS DIRECTED WITH INSULIN 4 TIMES A DAY 100 each 3  ? ketoconazole (NIZORAL) 2 % cream     ? OTEZLA 30 MG TABS     ? pravastatin (PRAVACHOL) 40 MG tablet TAKE 1 TABLET(40 MG) BY MOUTH EVERY EVENING 90 tablet 1  ? ?  No current facility-administered medications on file prior to visit.  ? ? ?Review of Systems ? ?   ?Objective:  ?There were no vitals filed for this visit. ?BP Readings from Last 3 Encounters:  ?02/15/22 121/69  ?01/26/22 128/80  ?01/24/22 130/82  ? ?Wt Readings from Last 3 Encounters:  ?02/15/22 172 lb 11.2 oz (78.3 kg)  ?01/26/22 171 lb 9.6 oz (77.8 kg)  ?01/24/22 170 lb (77.1 kg)  ? ?There is no height or weight on file to calculate BMI. ? ?  ?Physical Exam ?   ? ? ? ? ? ?Assessment & Plan:  ? ? ?See Problem List for Assessment and Plan of chronic medical problems.  ? ? ? ? ?

## 2022-03-04 ENCOUNTER — Ambulatory Visit: Payer: Medicare PPO | Admitting: Internal Medicine

## 2022-03-04 VITALS — BP 132/72 | HR 102 | Temp 98.7°F | Ht 66.0 in | Wt 174.2 lb

## 2022-03-04 DIAGNOSIS — I700 Atherosclerosis of aorta: Secondary | ICD-10-CM

## 2022-03-04 DIAGNOSIS — E782 Mixed hyperlipidemia: Secondary | ICD-10-CM | POA: Diagnosis not present

## 2022-03-04 DIAGNOSIS — I10 Essential (primary) hypertension: Secondary | ICD-10-CM

## 2022-03-04 DIAGNOSIS — B351 Tinea unguium: Secondary | ICD-10-CM | POA: Diagnosis not present

## 2022-03-04 MED ORDER — PRAVASTATIN SODIUM 40 MG PO TABS: ORAL_TABLET | ORAL | 1 refills | Status: DC

## 2022-03-04 MED ORDER — ITRACONAZOLE 100 MG PO CAPS: ORAL_CAPSULE | ORAL | 0 refills | Status: DC

## 2022-03-04 NOTE — Assessment & Plan Note (Signed)
Chronic ?Regular exercise and healthy diet encouraged ?Check lipid panel with next blood draw ?Continue pravastatin 40 mg daily-she would like me to prescribe this for her-no longer sees cardiology  ?

## 2022-03-04 NOTE — Patient Instructions (Addendum)
? ? ? ?  Medications changes include :   itraconzole 2 tabs twice daily for 7 days, then no medication for 21 days and then repeat. ? ? ?Your prescription(s) have been sent to your pharmacy.  ? ? ? ?Return for schedule lab appt end of May. ? ?

## 2022-03-04 NOTE — Assessment & Plan Note (Signed)
Chronic ?Continue pravastatin 40 mg daily ?Lipids not checked in a while-we will order for next blood draw ?Encouraged improving diet, exercise ? ?

## 2022-03-04 NOTE — Assessment & Plan Note (Signed)
Chronic ?Blood pressure well controlled ?CMP ?Continue amlodipine 10 mg daily ?

## 2022-03-04 NOTE — Assessment & Plan Note (Signed)
Chronic ?Left ring finger with onychomycosis that is severe in nature ?Had biopsy by Dr. Fredna Dow and confirmed Candida infection ?She has failed topical treatments ?She is here to discuss oral medications-discussed possible side effects and concerns with oral medication, especially liver toxicity ?Recent liver tests were normal ?She understands above and would like to try medication ?Start itraconazole-200 mg twice daily x1 week, 21 days off and then repeat once ?She will have her liver enzymes rechecked before starting the next round ?

## 2022-03-15 ENCOUNTER — Other Ambulatory Visit: Payer: Self-pay | Admitting: Internal Medicine

## 2022-03-15 ENCOUNTER — Other Ambulatory Visit: Payer: Self-pay | Admitting: Neurology

## 2022-03-16 DIAGNOSIS — L570 Actinic keratosis: Secondary | ICD-10-CM | POA: Diagnosis not present

## 2022-03-16 DIAGNOSIS — L400 Psoriasis vulgaris: Secondary | ICD-10-CM | POA: Diagnosis not present

## 2022-03-16 DIAGNOSIS — L719 Rosacea, unspecified: Secondary | ICD-10-CM | POA: Diagnosis not present

## 2022-03-16 DIAGNOSIS — B351 Tinea unguium: Secondary | ICD-10-CM | POA: Diagnosis not present

## 2022-03-25 ENCOUNTER — Other Ambulatory Visit: Payer: Self-pay | Admitting: Neurology

## 2022-03-25 ENCOUNTER — Other Ambulatory Visit (INDEPENDENT_AMBULATORY_CARE_PROVIDER_SITE_OTHER): Payer: Medicare PPO

## 2022-03-25 ENCOUNTER — Ambulatory Visit: Payer: Medicare PPO | Admitting: Internal Medicine

## 2022-03-25 DIAGNOSIS — E782 Mixed hyperlipidemia: Secondary | ICD-10-CM | POA: Diagnosis not present

## 2022-03-25 DIAGNOSIS — B351 Tinea unguium: Secondary | ICD-10-CM

## 2022-03-25 DIAGNOSIS — I10 Essential (primary) hypertension: Secondary | ICD-10-CM

## 2022-03-25 LAB — HEPATIC FUNCTION PANEL
ALT: 23 U/L (ref 0–35)
AST: 24 U/L (ref 0–37)
Albumin: 4.2 g/dL (ref 3.5–5.2)
Alkaline Phosphatase: 146 U/L — ABNORMAL HIGH (ref 39–117)
Bilirubin, Direct: 0.2 mg/dL (ref 0.0–0.3)
Total Bilirubin: 0.7 mg/dL (ref 0.2–1.2)
Total Protein: 7.3 g/dL (ref 6.0–8.3)

## 2022-03-25 LAB — LIPID PANEL
Cholesterol: 143 mg/dL (ref 0–200)
HDL: 55.6 mg/dL (ref >39.00)
LDL Cholesterol: 74 mg/dL (ref 0–99)
NonHDL: 87.69
Total CHOL/HDL Ratio: 3
Triglycerides: 67 mg/dL (ref 0.0–149.0)
VLDL: 13.4 mg/dL (ref 0.0–40.0)

## 2022-03-29 DIAGNOSIS — N390 Urinary tract infection, site not specified: Secondary | ICD-10-CM | POA: Diagnosis not present

## 2022-04-05 DIAGNOSIS — B351 Tinea unguium: Secondary | ICD-10-CM | POA: Diagnosis not present

## 2022-04-20 DIAGNOSIS — L570 Actinic keratosis: Secondary | ICD-10-CM | POA: Diagnosis not present

## 2022-04-20 DIAGNOSIS — L400 Psoriasis vulgaris: Secondary | ICD-10-CM | POA: Diagnosis not present

## 2022-04-20 DIAGNOSIS — B351 Tinea unguium: Secondary | ICD-10-CM | POA: Diagnosis not present

## 2022-05-04 ENCOUNTER — Ambulatory Visit (INDEPENDENT_AMBULATORY_CARE_PROVIDER_SITE_OTHER): Payer: Medicare PPO

## 2022-05-04 DIAGNOSIS — Z0000 Encounter for general adult medical examination without abnormal findings: Secondary | ICD-10-CM

## 2022-05-04 NOTE — Progress Notes (Signed)
I connected with Megan [name] today by telephone and verified that I am speaking with the correct person using two identifiers. Location patient: home Location provider: work Persons participating in the virtual visit: patient, provider.   I discussed the limitations, risks, security and privacy concerns of performing an evaluation and management service by telephone and the availability of in person appointments. I also discussed with the patient that there may be a patient responsible charge related to this service. The patient expressed understanding and verbally consented to this telephonic visit.    Interactive audio and video telecommunications were attempted between this provider and patient, however failed, due to patient having technical difficulties OR patient did not have access to video capability.  We continued and completed visit with audio only.  Some vital signs may be absent or patient reported.   Time Spent with patient on telephone encounter: 30 minutes  Subjective:   Megan [name] is a 81 y.o. female who presents for Medicare Annual (Subsequent) preventive examination.  Review of Systems     Cardiac Risk Factors include: advanced age (>56men, >49 women);diabetes mellitus;dyslipidemia;family history of premature cardiovascular disease;hypertension;sedentary lifestyle     Objective:    There were no vitals filed for this visit. There is no height or weight on file to calculate BMI.     05/04/2022    1:49 PM 02/15/2022    3:24 PM 01/20/2020    9:52 AM 10/11/2018    4:00 PM 08/01/2018    1:59 PM 03/19/2018   11:49 AM 01/16/2018    2:40 PM  Advanced Directives  Does Patient Have a Medical Advance Directive? Yes No Yes No Yes Yes Yes  Type of Materials engineer of [place];Living will  [place];Living will [place];Living will [place]  Does patient  want to make changes to medical advance directive? No - Patient declined        Copy of [place] in Chart? Yes - validated most recent copy scanned in chart (See row information)    No - copy requested No - copy requested No - copy requested  Would patient like information on creating a medical advance directive?  No - Patient declined  No - Patient declined       Current Medications (verified) Outpatient Encounter Medications as of 05/04/2022  Medication Sig   albuterol (PROVENTIL HFA;VENTOLIN HFA) 108 (90 Base) MCG/ACT inhaler Inhale 2 puffs into the lungs every 6 (six) hours as needed for wheezing or shortness of breath.   amLODipine (NORVASC) 10 MG tablet TAKE 1 TABLET BY MOUTH EVERY DAY   augmented betamethasone dipropionate (DIPROLENE-AF) 0.05 % ointment    B-D UF III MINI PEN NEEDLES 31G X 5 MM MISC USE AS DIRECTED WITH INSULIN 4 TIMES A DAY   betamethasone dipropionate (DIPROLENE) 0.05 % cream Apply topically 2 (two) times daily. (Patient taking differently: Apply 1 application. topically 2 (two) times daily.)   budesonide-formoterol (SYMBICORT) 80-4.5 MCG/ACT inhaler Inhale 2 puffs into the lungs 2 (two) times daily. (Patient taking differently: Inhale 2 puffs into the lungs daily as needed (Wheezing).)   Continuous Blood Gluc Sensor (DEXCOM G6 SENSOR) MISC Apply new sensor every 10 days for continuous glucose monitoring.   Continuous Blood Gluc Transmit (DEXCOM G6 TRANSMITTER) MISC Use one transmitter every 3 months for continuous glucose monitoring.   DULoxetine (CYMBALTA) 60 MG capsule TAKE 1 CAPSULE BY MOUTH 2 TIMES DAILY.  Fluocinolone Acetonide Scalp 0.01 % OIL    fluocinonide (LIDEX) 0.05 % external solution    GVOKE HYPOPEN 1-PACK 1 MG/0.2ML SOAJ Inject under skin 0.2 ml as needed for hypoglycemia   insulin aspart (NOVOLOG FLEXPEN) 100 UNIT/ML FlexPen INJECT 18-22 UNITS UNDER THE SKIN THREE TIMES DAILY   insulin degludec (TRESIBA FLEXTOUCH) 100 UNIT/ML  FlexTouch Pen Inject up to 36-38 units into the skin daily   itraconazole (SPORANOX) 100 MG capsule Take 2 tabs po bid x 1 week, then no medications for 3 weeks. Then repeat   ketoconazole (NIZORAL) 2 % cream    OTEZLA 30 MG TABS    pravastatin (PRAVACHOL) 40 MG tablet TAKE 1 TABLET(40 MG) BY MOUTH EVERY EVENING   No facility-administered encounter medications on file as of 05/04/2022.    Allergies (verified) Carboplatin, Remicade [infliximab], Other, Rifampin, and Hydromorphone   History: Past Medical History:  Diagnosis Date   Cancer ([place]) dx'd 09/2013   Lung ca   Concussion 2018   Diabetes ([place]) 09/08/2016   type I diabetic patient reported on 09/13/16    Diabetes mellitus    insulin   Diabetic neuropathy ([place]) 09/08/2016   Fall 2018   GERD (gastroesophageal reflux disease)    no meds for   History of colitis    History of hiatal hernia    Hx of radiation therapy 12/16/13-01/30/14   lung 66Gy   Hypertension    Malignant neoplasm of right upper lobe of lung ([place]) 11/28/2013   Neuropathy    Psoriasis    Psoriatic arthritis ([place]) 09/08/2016   Rheumatoid arthritis ([place])    Shortness of breath dyspnea    with exertion   Past Surgical History:  Procedure Laterality Date   ABDOMINAL HYSTERECTOMY     ABDOMINAL SURGERY     BALLOON DILATION N/A 12/12/2014   Procedure: BALLOON DILATION;  Surgeon: Inda Castle, MD;  Location: [facility];  Service: Endoscopy;  Laterality: N/A;   BIOPSY  01/20/2020   Procedure: BIOPSY;  Surgeon: Doran Stabler, MD;  Location: [facility];  Service: Gastroenterology;;   CHOLECYSTECTOMY     COLONOSCOPY N/A 02/07/2016   Procedure: COLONOSCOPY;  Surgeon: Doran Stabler, MD;  Location: [facility];  Service: Endoscopy;  Laterality: N/A;   DILATION AND CURETTAGE OF UTERUS     ESOPHAGOGASTRODUODENOSCOPY N/A 12/12/2014   Procedure: ESOPHAGOGASTRODUODENOSCOPY (EGD);  Surgeon: Inda Castle, MD;  Location: [facility];  Service: Endoscopy;   Laterality: N/A;  with dilation   ESOPHAGOGASTRODUODENOSCOPY (EGD) WITH PROPOFOL N/A 10/09/2014   Procedure: ESOPHAGOGASTRODUODENOSCOPY (EGD) WITH PROPOFOL;  Surgeon: Inda Castle, MD;  Location: [facility];  Service: Endoscopy;  Laterality: N/A;   ESOPHAGOGASTRODUODENOSCOPY (EGD) WITH PROPOFOL N/A 09/19/2016   Procedure: ESOPHAGOGASTRODUODENOSCOPY (EGD) WITH PROPOFOL;  Surgeon: Doran Stabler, MD;  [facility];  Service: Gastroenterology;  Laterality: N/A;  with savary dil tt   ESOPHAGOGASTRODUODENOSCOPY (EGD) WITH PROPOFOL N/A 01/20/2020   Procedure: ESOPHAGOGASTRODUODENOSCOPY (EGD) WITH PROPOFOL;  Surgeon: Doran Stabler, MD;  [facility];  Service: Gastroenterology;  Laterality: N/A;  Fluoro-Savary   ORIF ANKLE FRACTURE Right 07/04/2015   Procedure: OPEN REDUCTION INTERNAL FIXATION (ORIF) ANKLE FRACTURE;  Surgeon: Newt Minion, MD;  Location: [facility];  Service: Orthopedics;  Laterality: Right;  OPEN REDUCTION, INTERNAL FIXATION OF RIGHT ANKLE FRACTURE.    ORIF ANKLE FRACTURE Left 02/10/2016   Procedure: OPEN REDUCTION INTERNAL FIXATION (ORIF) ANKLE FRACTURE;  Surgeon: Newt Minion, MD;  Location: [facility];  Service: Orthopedics;  Laterality: Left;   SAVORY DILATION N/A 10/09/2014   Procedure: SAVORY DILATION;  Surgeon: Inda Castle, MD;  Location: [facility];  Service: Endoscopy;  Laterality: N/A;   SAVORY DILATION N/A 09/19/2016   Procedure: SAVORY DILATION;  Surgeon: Doran Stabler, MD;  [facility];  Service: Gastroenterology;  Laterality: N/A;   SAVORY DILATION N/A 01/20/2020   Procedure: SAVORY DILATION;  Surgeon: Doran Stabler, MD;  [facility];  Service: Gastroenterology;  Laterality: N/A;   TONSILLECTOMY     Family History  Problem Relation Age of Onset   Other Mother    Anuerysm Mother        brain   Other Father        failure to thrive   Hypertension Other    Stroke Other    Heart attack Other    Hemachromatosis  Other        great grandfather   Rheum arthritis Other        both sets of grandparents and father   Migraines Neg Hx    Colon cancer Neg Hx    Colon polyps Neg Hx    Esophageal cancer Neg Hx    Pancreatic cancer Neg Hx    Stomach cancer Neg Hx    Liver disease Neg Hx    Social History   Socioeconomic History   Marital status: Divorced    Spouse name: Not on file   Number of children: 0   Years of education: MA   Highest education level: Master's degree (e.g., MA, MS, MEng, MEd, MSW, MBA)  Occupational History   Occupation: Retired    Fish farm manager: OTHER  Tobacco Use   Smoking status: Former    Packs/day: 3.00    Years: 45.00    Total pack years: 135.00    Types: Cigarettes    Quit date: 12/05/1997    Years since quitting: 24.4   Smokeless tobacco: Never  Vaping Use   Vaping Use: Never used  Substance and Sexual Activity   Alcohol use: No    Alcohol/week: 0.0 standard drinks of alcohol    Comment: rarely    Drug use: No   Sexual activity: Not Currently  Other Topics Concern   Not on file  Social History Narrative   Patient lives at home. She has an old friend who is her caregiver, Gladys Damme, who stays with her 24/7.   Caffeine Use: 1 cups daily   Right handed   Social Determinants of Health   Financial Resource Strain: Low Risk  (05/04/2022)   Overall Financial Resource Strain (CARDIA)    Difficulty of Paying Living Expenses: Not hard at all  Food Insecurity: No Food Insecurity (05/04/2022)   Hunger Vital Sign    Worried About Running Out of Food in the Last Year: Never true    Ran Out of Food in the Last Year: Never true  Transportation Needs: No Transportation Needs (05/04/2022)   PRAPARE - Hydrologist (Medical): No    Lack of Transportation (Non-Medical): No  Physical Activity: Inactive (05/04/2022)   Exercise Vital Sign    Days of Exercise per Week: 0 days    Minutes of Exercise per Session: 0 min  Stress: No Stress Concern  Present (05/04/2022)   [place]    Feeling of Stress : Not at all  Social Connections: Moderately Integrated (05/04/2022)   Social Connection and  Isolation Panel [NHANES]    Frequency of Communication with Friends and Family: More than three times a week    Frequency of Social Gatherings with Friends and Family: More than three times a week    Attends Religious Services: More than 4 times per year    Active Member of [employer] or Organizations: Yes    Attends Music therapist: More than 4 times per year    Marital Status: Divorced    Tobacco Counseling Counseling given: Not Answered   Clinical Intake:  Pre-visit preparation completed: Yes  Pain : No/denies pain     BMI - recorded: 28.13 Nutritional Status: BMI 25 -29 Overweight Nutritional Risks: None Diabetes: No  How often do you need to have someone help you when you read instructions, pamphlets, or other written materials from your doctor or pharmacy?: 1 - Never What is the last grade level you completed in school?: Master's Degree  Diabetic? yes  Interpreter Needed?: No  Information entered by :: Lisette Abu, LPN.   Activities of Daily Living    05/04/2022    1:51 PM 10/12/2021    3:53 PM  In your present state of health, do you have any difficulty performing the following activities:  Hearing? 1 0  Vision? 0 0  Difficulty concentrating or making decisions? 0 0  Walking or climbing stairs? 0 0  Dressing or bathing? 0 0  Doing errands, shopping? 0 0  Preparing Food and eating ? N   Using the Toilet? N   In the past six months, have you accidently leaked urine? N   Do you have problems with loss of bowel control? N   Managing your Medications? N   Managing your Finances? N   Housekeeping or managing your Housekeeping? N     Patient Care Team: Binnie Rail, MD as PCP - General (Internal Medicine) Curt Bears, MD as  Consulting Physician (Oncology) Knox Royalty, RN as Case Manager Luberta Mutter, MD as Consulting Physician (Ophthalmology)  Indicate any recent Medical Services you may have received from other than [facility] providers in the past year (date may be approximate).     Assessment:   This is a routine wellness examination for [place].  Hearing/Vision screen Hearing Screening - Comments:: Patient wears hearing aids. Vision Screening - Comments:: Patient does not wear any corrective lenses/contacts.   Eye exam done by:  Luberta Mutter, MD.    Dietary issues and exercise activities discussed: Current Exercise Habits: The patient does not participate in regular exercise at present, Exercise limited by: respiratory conditions(s)   Goals Addressed             This Visit's Progress    Patient declined health goal at this time.        Depression Screen    05/04/2022    1:42 PM 08/04/2021    3:30 PM 10/09/2020    9:55 AM 03/19/2018   11:50 AM 09/29/2017    3:59 PM 11/11/2016    5:42 PM 09/01/2016    2:50 PM  PHQ 2/9 Scores  PHQ - 2 Score 0 0 0 2 0 0 0  PHQ- 9 Score    4       Fall Risk    05/04/2022    1:39 PM 01/24/2022   10:00 AM 08/04/2021    3:30 PM 11/18/2019    3:16 PM 03/19/2018   11:50 AM  [place] in the past year? 0 0 0 0  No  Number falls in past yr: 0 0 0 0   Injury with Fall? 0 0 0    Comment  N/A- no falls reported N/A- no falls reported over last 12 months    Risk for fall due to : No Fall Risks Orthopedic patient;Impaired balance/gait Impaired balance/gait;History of fall(s);Medication side effect    Follow up Falls evaluation completed Falls prevention discussed Falls prevention discussed      FALL RISK PREVENTION PERTAINING TO THE HOME:  Any stairs in or around the home? No  If so, are there any without handrails? No  Home free of loose throw rugs in walkways, pet beds, electrical cords, etc? Yes  Adequate lighting in your home to reduce risk  of falls? Yes   ASSISTIVE DEVICES UTILIZED TO PREVENT FALLS:  Life alert? No  Use of a cane, walker or w/c? No  Grab bars in the bathroom? Yes  Shower chair or bench in shower? Yes  Elevated toilet seat or a handicapped toilet? No   TIMED UP AND GO:  Was the test performed? No .  Length of time to ambulate 10 feet: n/a sec.   Appearance of gait: Gait not evaluated during this visit.  Cognitive Function:    03/19/2018   11:54 AM  MMSE - Mini Mental State Exam  Orientation to time 5  Orientation to Place 5  Registration 3  Attention/ Calculation 5  Recall 1  Language- name 2 objects 2  Language- repeat 1  Language- follow 3 step command 3  Language- read & follow direction 1  Write a sentence 1  Copy design 1  Total score 28        05/04/2022    1:52 PM  6CIT Screen  What Year? 0 points  What month? 0 points  What time? 0 points  Count back from 20 0 points  Months in reverse 0 points  Repeat phrase 0 points  Total Score 0 points    Immunizations Immunization History  Administered Date(s) Administered   Fluad Quad(high Dose 65+) 07/31/2019, 10/01/2020   Influenza, High Dose Seasonal PF 08/12/2016, 08/30/2017, 09/07/2018, 08/24/2021   Influenza,inj,Quad PF,6+ Mos 11/07/2013, 07/14/2014, 08/25/2015   Influenza-Unspecified 08/07/2018, 09/07/2018   PFIZER(Purple Top)SARS-COV-2 Vaccination 11/28/2019, 12/25/2019, 06/18/2020   PPD Test 03/02/2016   Pfizer Covid-19 Vaccine Bivalent Booster 52yrs & up 07/27/2021   Pneumococcal Conjugate-13 05/10/2019   Pneumococcal Polysaccharide-23 03/19/2018   Pneumococcal-Unspecified 12/02/2015   Tdap 01/11/2017   Zoster Recombinat (Shingrix) 02/14/2018, 08/07/2018    TDAP status: Up to date  Flu Vaccine status: Up to date  Pneumococcal vaccine status: Up to date  Covid-19 vaccine status: Completed vaccines  Qualifies for Shingles Vaccine? Yes   Zostavax completed No   Shingrix Completed?: Yes  Screening  Tests Health Maintenance  Topic Date Due   DEXA SCAN  Never done   OPHTHALMOLOGY EXAM  05/05/2021   URINE MICROALBUMIN  10/09/2021   INFLUENZA VACCINE  05/31/2022   HEMOGLOBIN A1C  07/27/2022   FOOT EXAM  01/25/2023   TETANUS/TDAP  01/12/2027   Pneumonia Vaccine 79+ Years old  Completed   COVID-19 Vaccine  Completed   Zoster Vaccines- Shingrix  Completed   HPV VACCINES  Aged Out    Health Maintenance  Health Maintenance Due  Topic Date Due   DEXA SCAN  Never done   OPHTHALMOLOGY EXAM  05/05/2021   URINE MICROALBUMIN  10/09/2021    Colorectal cancer screening: No longer required.   Mammogram status: No longer  required due to age.  Bone Density status: never done/no record  Lung Cancer Screening: (Low Dose CT Chest recommended if Age 22-80 years, 30 pack-year currently smoking OR have quit w/in 15years.) does not qualify.   Lung Cancer Screening Referral: no  Additional Screening:  Hepatitis C Screening: does not qualify; Completed no  Vision Screening: Recommended annual ophthalmology exams for early detection of glaucoma and other disorders of the eye. Is the patient up to date with their annual eye exam?  Yes  Who is the provider or what is the name of the office in which the patient attends annual eye exams? Luberta Mutter, MD. If pt is not established with a provider, would they like to be referred to a provider to establish care? No .   Dental Screening: Recommended annual dental exams for proper oral hygiene  Community Resource Referral / Chronic Care Management: CRR required this visit?  No   CCM required this visit?  No      Plan:     I have personally reviewed and noted the following in the patient's chart:   Medical and social history Use of alcohol, tobacco or illicit drugs  Current medications and supplements including opioid prescriptions.  Functional ability and status Nutritional status Physical activity Advanced directives List of  other physicians Hospitalizations, surgeries, and ER visits in previous 12 months Vitals Screenings to include cognitive, depression, and falls Referrals and appointments  In addition, I have reviewed and discussed with patient certain preventive protocols, quality metrics, and best practice recommendations. A written personalized care plan for preventive services as well as general preventive health recommendations were provided to patient.     Sheral Flow, LPN   01/05/487   Nurse Notes:  There were no vitals filed for this visit. There is no height or weight on file to calculate BMI. Patient stated that she has no issues with gait or balance; does not use any assistive devices.

## 2022-05-04 NOTE — Patient Instructions (Signed)
[name] , Thank you for taking time to come for your Medicare Wellness Visit. I appreciate your ongoing commitment to your health goals. Please review the following plan we discussed and let me know if I can assist you in the future.   Screening recommendations/referrals: Colonoscopy: Discontinued due to age 81: Discontinued due to age (gets CT Scan once a year) Bone Density: Never done/No record Recommended yearly ophthalmology/optometry visit for glaucoma screening and checkup Recommended yearly dental visit for hygiene and checkup  Vaccinations: Influenza vaccine: 08/24/2021 Pneumococcal vaccine: 03/19/2018, 05/10/2019 Tdap vaccine: 01/11/2017; due every 10 years (due 01/12/2027) Shingles vaccine: 02/14/2018, 08/07/2018   Covid-19: 11/28/2019, 12/25/2019, 06/18/2020, 07/27/2021  Advanced directives: Yes; documents on file.  Conditions/risks identified: Yes  Next appointment: Please schedule your next Medicare Wellness Visit with your Nurse Health Advisor in 1 year by calling [phone].   Preventive Care 61 Years and Older, Female Preventive care refers to lifestyle choices and visits with your health care provider that can promote health and wellness. What does preventive care include? A yearly physical exam. This is also called an annual well check. Dental exams once or twice a year. Routine eye exams. Ask your health care provider how often you should have your eyes checked. Personal lifestyle choices, including: Daily care of your teeth and gums. Regular physical activity. Eating a healthy diet. Avoiding tobacco and drug use. Limiting alcohol use. Practicing safe sex. Taking low-dose aspirin every day. Taking vitamin and mineral supplements as recommended by your health care provider. What happens during an annual well check? The services and screenings done by your health care provider during your annual well check will depend on your age, overall health, lifestyle  risk factors, and family history of disease. Counseling  Your health care provider may ask you questions about your: Alcohol use. Tobacco use. Drug use. Emotional well-being. Home and relationship well-being. Sexual activity. Eating habits. History of falls. Memory and ability to understand (cognition). Work and work Statistician. Reproductive health. Screening  You may have the following tests or measurements: Height, weight, and BMI. Blood pressure. Lipid and cholesterol levels. These may be checked every 5 years, or more frequently if you are over 54 years old. Skin check. Lung cancer screening. You may have this screening every year starting at age 15 if you have a 30-pack-year history of smoking and currently smoke or have quit within the past 15 years. Fecal occult blood test (FOBT) of the stool. You may have this test every year starting at age 53. Flexible sigmoidoscopy or colonoscopy. You may have a sigmoidoscopy every 5 years or a colonoscopy every 10 years starting at age 25. Hepatitis C blood test. Hepatitis B blood test. Sexually transmitted disease (STD) testing. Diabetes screening. This is done by checking your blood sugar (glucose) after you have not eaten for a while (fasting). You may have this done every 1-3 years. Bone density scan. This is done to screen for osteoporosis. You may have this done starting at age 68. Mammogram. This may be done every 1-2 years. Talk to your health care provider about how often you should have regular mammograms. Talk with your health care provider about your test results, treatment options, and if necessary, the need for more tests. Vaccines  Your health care provider may recommend certain vaccines, such as: Influenza vaccine. This is recommended every year. Tetanus, diphtheria, and acellular pertussis (Tdap, Td) vaccine. You may need a Td booster every 10 years. Zoster vaccine. You may need this after age  60. Pneumococcal  13-valent conjugate (PCV13) vaccine. One dose is recommended after age 45. Pneumococcal polysaccharide (PPSV23) vaccine. One dose is recommended after age 70. Talk to your health care provider about which screenings and vaccines you need and how often you need them. This information is not intended to replace advice given to you by your health care provider. Make sure you discuss any questions you have with your health care provider. Document Released: 11/13/2015 Document Revised: 07/06/2016 Document Reviewed: 08/18/2015 Elsevier Interactive Patient Education  2017 [employer] Prevention in the Home Falls can cause injuries. They can happen to people of all ages. There are many things you can do to make your home safe and to help prevent falls. What can I do on the outside of my home? Regularly fix the edges of walkways and driveways and fix any cracks. Remove anything that might make you trip as you walk through a door, such as a raised step or threshold. Trim any bushes or trees on the path to your home. Use bright outdoor lighting. Clear any walking paths of anything that might make someone trip, such as rocks or tools. Regularly check to see if handrails are loose or broken. Make sure that both sides of any steps have handrails. Any raised decks and porches should have guardrails on the edges. Have any leaves, snow, or ice cleared regularly. Use sand or salt on walking paths during winter. Clean up any spills in your garage right away. This includes oil or grease spills. What can I do in the bathroom? Use night lights. Install grab bars by the toilet and in the tub and shower. Do not use towel bars as grab bars. Use non-skid mats or decals in the tub or shower. If you need to sit down in the shower, use a plastic, non-slip stool. Keep the floor dry. Clean up any water that spills on the floor as soon as it happens. Remove soap buildup in the tub or shower regularly. Attach  bath mats securely with double-sided non-slip rug tape. Do not have throw rugs and other things on the floor that can make you trip. What can I do in the bedroom? Use night lights. Make sure that you have a light by your bed that is easy to reach. Do not use any sheets or blankets that are too big for your bed. They should not hang down onto the floor. Have a firm chair that has side arms. You can use this for support while you get dressed. Do not have throw rugs and other things on the floor that can make you trip. What can I do in the kitchen? Clean up any spills right away. Avoid walking on wet floors. Keep items that you use a lot in easy-to-reach places. If you need to reach something above you, use a strong step stool that has a grab bar. Keep electrical cords out of the way. Do not use floor polish or wax that makes floors slippery. If you must use wax, use non-skid floor wax. Do not have throw rugs and other things on the floor that can make you trip. What can I do with my stairs? Do not leave any items on the stairs. Make sure that there are handrails on both sides of the stairs and use them. Fix handrails that are broken or loose. Make sure that handrails are as long as the stairways. Check any carpeting to make sure that it is firmly attached to the stairs. Fix  any carpet that is loose or worn. Avoid having throw rugs at the top or bottom of the stairs. If you do have throw rugs, attach them to the floor with carpet tape. Make sure that you have a light switch at the top of the stairs and the bottom of the stairs. If you do not have them, ask someone to add them for you. What else can I do to help prevent falls? Wear shoes that: Do not have high heels. Have rubber bottoms. Are comfortable and fit you well. Are closed at the toe. Do not wear sandals. If you use a stepladder: Make sure that it is fully opened. Do not climb a closed stepladder. Make sure that both sides of the  stepladder are locked into place. Ask someone to hold it for you, if possible. Clearly mark and make sure that you can see: Any grab bars or handrails. First and last steps. Where the edge of each step is. Use tools that help you move around (mobility aids) if they are needed. These include: Canes. Walkers. Scooters. Crutches. Turn on the lights when you go into a dark area. Replace any light bulbs as soon as they burn out. Set up your furniture so you have a clear path. Avoid moving your furniture around. If any of your floors are uneven, fix them. If there are any pets around you, be aware of where they are. Review your medicines with your doctor. Some medicines can make you feel dizzy. This can increase your chance of falling. Ask your doctor what other things that you can do to help prevent falls. This information is not intended to replace advice given to you by your health care provider. Make sure you discuss any questions you have with your health care provider. Document Released: 08/13/2009 Document Revised: 03/24/2016 Document Reviewed: 11/21/2014 Elsevier Interactive Patient Education  2017 [employer].

## 2022-05-10 ENCOUNTER — Telehealth: Payer: Self-pay

## 2022-05-10 NOTE — Telephone Encounter (Signed)
Inbound call from patient/DME supplier requesting form be completed and faxed with clinical notes. DME supplies ordered via [employer] through online portal.

## 2022-05-18 DIAGNOSIS — L821 Other seborrheic keratosis: Secondary | ICD-10-CM | POA: Diagnosis not present

## 2022-05-18 DIAGNOSIS — L400 Psoriasis vulgaris: Secondary | ICD-10-CM | POA: Diagnosis not present

## 2022-05-18 DIAGNOSIS — B351 Tinea unguium: Secondary | ICD-10-CM | POA: Diagnosis not present

## 2022-05-23 ENCOUNTER — Telehealth: Payer: Self-pay | Admitting: Internal Medicine

## 2022-05-23 DIAGNOSIS — E139 Other specified diabetes mellitus without complications: Secondary | ICD-10-CM

## 2022-05-23 NOTE — Telephone Encounter (Signed)
Pt called in and states she needs a new referral for Endocrinology.   I informed pt that typically an appt is needed before that could be done. Pt states "she knows me and I am not a new pt."   Requesting call back from assistant. Please let her know if appt is needed.

## 2022-05-24 ENCOUNTER — Ambulatory Visit: Payer: Medicare PPO | Admitting: Internal Medicine

## 2022-05-24 ENCOUNTER — Encounter: Payer: Self-pay | Admitting: Internal Medicine

## 2022-05-24 VITALS — BP 120/76 | HR 105 | Ht 66.0 in | Wt 169.4 lb

## 2022-05-24 DIAGNOSIS — E1349 Other specified diabetes mellitus with other diabetic neurological complication: Secondary | ICD-10-CM

## 2022-05-24 DIAGNOSIS — E1365 Other specified diabetes mellitus with hyperglycemia: Secondary | ICD-10-CM | POA: Diagnosis not present

## 2022-05-24 DIAGNOSIS — E1149 Type 2 diabetes mellitus with other diabetic neurological complication: Secondary | ICD-10-CM

## 2022-05-24 DIAGNOSIS — E782 Mixed hyperlipidemia: Secondary | ICD-10-CM | POA: Diagnosis not present

## 2022-05-24 DIAGNOSIS — E139 Other specified diabetes mellitus without complications: Secondary | ICD-10-CM

## 2022-05-24 LAB — POCT GLYCOSYLATED HEMOGLOBIN (HGB A1C): Hemoglobin A1C: 8.1 % — AB (ref 4.0–5.6)

## 2022-05-24 NOTE — Patient Instructions (Addendum)
Please increase: - Tresiba 36 units daily - NovoLog 15 minutes before a meal: B - 18 units L - 18-20 units D - 18-22 units  You may need 5 units of Novolog before a snack or dessert.              Please do not skip the insulin if sugars are 70-100.              If the sugars are 60-70, take 1/2 of the Humalog dose.             If the sugars are <60, skip insulin with that meal If you take Novolog after a meal, take no more than 10 units.             No Novolog at bedtime.             Please return in 3-4 months.

## 2022-05-24 NOTE — Telephone Encounter (Signed)
Referral ordered for [facility] which is associated with [facility].  There is no [facility].

## 2022-05-24 NOTE — Progress Notes (Signed)
Patient ID: VEE BAHE, female   DOB: [birth year], 81 y.o.   MRN: [id number]  HPI: Megan [name] is a 81 y.o.-year-old female, presenting for f/u for DM2, dx in 2013, and as LADA in 04/2016, insulin-dependent since 2014, uncontrolled, with complications (DKA, ? Related PN).  Last visit 4 months ago. She resides in [facility]  Interim history: No increased urination, blurry vision, nausea, chest pain.  She has UTIs - sees Dr. Jeffie Pollock. She continues to do physical therapy.  Exercising 5 days a week.  Reviewed history:  She also has a history of lung cancer diagnosed 09/2013.  She had chemotherapy and radiation therapy for this.  She has a complicated medical history, with a diagnosis of ulcerative colitis with significant diarrhea that caused dehydration in the past and episodes of DKA. She had 2 admissions for this in 02/26/2016 and 04/14/2016. While in rehabilitation, her CBGs dropped to 30s on her previous dose of insulin, therefore, her long-acting insulin was decreased.   In 03/2020: Her friend found her in a coma>> taken to the hospital >> Glu 1058.  Before this, patient was eating a lot of sugary desserts.  Also, she was not taking the insulin as recommended.  Reviewed HbA1c levels: Lab Results  Component Value Date   HGBA1C 8.4 (A) 01/24/2022   HGBA1C 8.3 (A) 09/17/2021   HGBA1C 8.9 (A) 07/13/2021  12/30/2015: 12% 09/18/2015: 11.3% 06/17/2015: 7.5% 04/08/2015: 10.9% 09/10/2014: 8.5%  11/04/2013: 6.2%  She inquired about an insulin pump in the past, but after we discussed about the complexity of managing this, we decided to continue with insulin pens.  She is on: In red, doses that she is actually taking compared to the recommended doses: - Tresiba 30 >> 36 >> 32 units daily - NovoLog 15 minutes before a meal: B - 18 units L - 20-22 >> 15-16 units D - 18-22 units >> 18 units You may need 5 units of Novolog before a snack or dessert.              Please do not skip the insulin if sugars are 70-100.              If the sugars are 60-70, take 1/2 of the Humalog dose.             If the sugars are <60, skip insulin with that meal If you take Novolog after a meal, take no more than 10 units.             No Novolog at bedtime.  She checks her sugars more than 4 times a day with her Dexcom G6 CGM- from [facility]:     Previously:   Previously:   Lowest sugar was 29 >> ... 55 x1, 82 >> 68.  she has hypoglycemia awareness in the 50s. Highest sugar was 1058 >> .Marland KitchenMarland Kitchen 400 >> HI >> 300s  Pt's meals are: - Breakfast: Cereal or oatmeal with blueberries or eggs with sausage or fruit cup >> fruit + yoghurt - Lunch: Sandwich and soup - Dinner: Meat/fish, vegetables, small amount of starch - Snacks: 3-4 a day: Peanuts  -+ Mild CKD, last BUN/creatinine:  Lab Results  Component Value Date   BUN 15 02/11/2022   BUN 13 02/11/2021   CREATININE 0.93 02/11/2022   CREATININE 1.12 (H) 02/11/2021   -+ HL: last set of lipids: Lab Results  Component Value Date   CHOL 143 03/25/2022   HDL 55.60 03/25/2022  LDLCALC 74 03/25/2022   LDLDIRECT 156.0 05/10/2019   TRIG 67.0 03/25/2022   CHOLHDL 3 03/25/2022  On pravastatin 40.  - last eye exam was in 04/2020: No DR; Dr. Lady Gary. Coming up in 09/2022. Had cataract sx.  - + numbness but no tingling in her feet.  She has PN from ChTx and possibly also from diabetes.  Off Lyrica.  On Cymbalta.  Last foot exam 01/24/2022.  She has pruritus from her psoriasis.  Pruritus improved on [place].  ROS: + see HPI Cardiovascular: no CP/+ SOB/no palpitations/+ leg swelling Respiratory: no cough/+ SOB/+ wheezing  I reviewed pt's medications, allergies, PMH, social hx, family hx, and changes were documented in the history of present illness. Otherwise, unchanged from my initial visit note.  Past Medical History:  Diagnosis Date   Cancer ([place]) dx'd 09/2013   Lung ca   Concussion 2018    Diabetes ([place]) 09/08/2016   type I diabetic patient reported on 09/13/16    Diabetes mellitus    insulin   Diabetic neuropathy ([place]) 09/08/2016   Fall 2018   GERD (gastroesophageal reflux disease)    no meds for   History of colitis    History of hiatal hernia    Hx of radiation therapy 12/16/13-01/30/14   lung 66Gy   Hypertension    Malignant neoplasm of right upper lobe of lung ([place]) 11/28/2013   Neuropathy    Psoriasis    Psoriatic arthritis ([place]) 09/08/2016   Rheumatoid arthritis ([place])    Shortness of breath dyspnea    with exertion   Past Surgical History:  Procedure Laterality Date   ABDOMINAL HYSTERECTOMY     ABDOMINAL SURGERY     BALLOON DILATION N/A 12/12/2014   Procedure: BALLOON DILATION;  Surgeon: Inda Castle, MD;  Location: [facility];  Service: Endoscopy;  Laterality: N/A;   BIOPSY  01/20/2020   Procedure: BIOPSY;  Surgeon: Doran Stabler, MD;  Location: [facility];  Service: Gastroenterology;;   CHOLECYSTECTOMY     COLONOSCOPY N/A 02/07/2016   Procedure: COLONOSCOPY;  Surgeon: Doran Stabler, MD;  Location: [facility];  Service: Endoscopy;  Laterality: N/A;   DILATION AND CURETTAGE OF UTERUS     ESOPHAGOGASTRODUODENOSCOPY N/A 12/12/2014   Procedure: ESOPHAGOGASTRODUODENOSCOPY (EGD);  Surgeon: Inda Castle, MD;  Location: [facility];  Service: Endoscopy;  Laterality: N/A;  with dilation   ESOPHAGOGASTRODUODENOSCOPY (EGD) WITH PROPOFOL N/A 10/09/2014   Procedure: ESOPHAGOGASTRODUODENOSCOPY (EGD) WITH PROPOFOL;  Surgeon: Inda Castle, MD;  Location: [facility];  Service: Endoscopy;  Laterality: N/A;   ESOPHAGOGASTRODUODENOSCOPY (EGD) WITH PROPOFOL N/A 09/19/2016   Procedure: ESOPHAGOGASTRODUODENOSCOPY (EGD) WITH PROPOFOL;  Surgeon: Doran Stabler, MD;  [facility];  Service: Gastroenterology;  Laterality: N/A;  with savary dil tt   ESOPHAGOGASTRODUODENOSCOPY (EGD) WITH PROPOFOL N/A 01/20/2020   Procedure: ESOPHAGOGASTRODUODENOSCOPY (EGD)  WITH PROPOFOL;  Surgeon: Doran Stabler, MD;  [facility];  Service: Gastroenterology;  Laterality: N/A;  Fluoro-Savary   ORIF ANKLE FRACTURE Right 07/04/2015   Procedure: OPEN REDUCTION INTERNAL FIXATION (ORIF) ANKLE FRACTURE;  Surgeon: Newt Minion, MD;  Location: [facility];  Service: Orthopedics;  Laterality: Right;  OPEN REDUCTION, INTERNAL FIXATION OF RIGHT ANKLE FRACTURE.    ORIF ANKLE FRACTURE Left 02/10/2016   Procedure: OPEN REDUCTION INTERNAL FIXATION (ORIF) ANKLE FRACTURE;  Surgeon: Newt Minion, MD;  Location: [place];  Service: Orthopedics;  Laterality: Left;   SAVORY DILATION N/A 10/09/2014   Procedure: SAVORY DILATION;  Surgeon:  Inda Castle, MD;  Location: [place];  Service: Endoscopy;  Laterality: N/A;   SAVORY DILATION N/A 09/19/2016   Procedure: SAVORY DILATION;  Surgeon: Doran Stabler, MD;  [facility];  Service: Gastroenterology;  Laterality: N/A;   SAVORY DILATION N/A 01/20/2020   Procedure: SAVORY DILATION;  Surgeon: Doran Stabler, MD;  [facility];  Service: Gastroenterology;  Laterality: N/A;   TONSILLECTOMY     Social History   Social History   Marital Status: Divorced    Spouse Name: N/A   Number of Children: 0   Occupational History   Retired Pharmacist, hospital Other   Social History Main Topics   Smoking status: Former Smoker -- 3.00 packs/day for 45 years    Types: Cigarettes    Quit date: 12/05/1997   Smokeless tobacco: Never Used   Alcohol Use: No   Drug Use: No   Social History Narrative   Patient lives at home alone.   Caffeine Use: 2 cups daily   Current Outpatient Medications on File Prior to Visit  Medication Sig Dispense Refill   albuterol (PROVENTIL HFA;VENTOLIN HFA) 108 (90 Base) MCG/ACT inhaler Inhale 2 puffs into the lungs every 6 (six) hours as needed for wheezing or shortness of breath. 1 Inhaler 2   amLODipine (NORVASC) 10 MG tablet TAKE 1 TABLET BY MOUTH EVERY DAY 90 tablet 1   augmented  betamethasone dipropionate (DIPROLENE-AF) 0.05 % ointment      B-D UF III MINI PEN NEEDLES 31G X 5 MM MISC USE AS DIRECTED WITH INSULIN 4 TIMES A DAY 100 each 3   betamethasone dipropionate (DIPROLENE) 0.05 % cream Apply topically 2 (two) times daily. (Patient taking differently: Apply 1 application. topically 2 (two) times daily.) 45 g 0   budesonide-formoterol (SYMBICORT) 80-4.5 MCG/ACT inhaler Inhale 2 puffs into the lungs 2 (two) times daily. (Patient taking differently: Inhale 2 puffs into the lungs daily as needed (Wheezing).) 1 Inhaler 6   Continuous Blood Gluc Sensor (DEXCOM G6 SENSOR) MISC Apply new sensor every 10 days for continuous glucose monitoring. 9 each 3   Continuous Blood Gluc Transmit (DEXCOM G6 TRANSMITTER) MISC Use one transmitter every 3 months for continuous glucose monitoring. 1 each 0   DULoxetine (CYMBALTA) 60 MG capsule TAKE 1 CAPSULE BY MOUTH 2 TIMES DAILY. 180 capsule 0   Fluocinolone Acetonide Scalp 0.01 % OIL      fluocinonide (LIDEX) 0.05 % external solution      GVOKE HYPOPEN 1-PACK 1 MG/0.2ML SOAJ Inject under skin 0.2 ml as needed for hypoglycemia 0.2 mL prn   insulin aspart (NOVOLOG FLEXPEN) 100 UNIT/ML FlexPen INJECT 18-22 UNITS UNDER THE SKIN THREE TIMES DAILY 45 mL 3   insulin degludec (TRESIBA FLEXTOUCH) 100 UNIT/ML FlexTouch Pen Inject up to 36-38 units into the skin daily 30 mL 3   itraconazole (SPORANOX) 100 MG capsule Take 2 tabs po bid x 1 week, then no medications for 3 weeks. Then repeat 56 capsule 0   ketoconazole (NIZORAL) 2 % cream      OTEZLA 30 MG TABS      pravastatin (PRAVACHOL) 40 MG tablet TAKE 1 TABLET(40 MG) BY MOUTH EVERY EVENING 90 tablet 1   No current facility-administered medications on file prior to visit.   Allergies  Allergen Reactions   Carboplatin Anaphylaxis, Shortness Of Breath and Other (See Comments)    Neuropathy   Remicade [Infliximab] Anaphylaxis   Other    Rifampin Other (See Comments)    unknown  Hydromorphone  Rash   Family History  Problem Relation Age of Onset   Other Mother    Anuerysm Mother        brain   Other Father        failure to thrive   Hypertension Other    Stroke Other    Heart attack Other    Hemachromatosis Other        great grandfather   Rheum arthritis Other        both sets of grandparents and father   Migraines Neg Hx    Colon cancer Neg Hx    Colon polyps Neg Hx    Esophageal cancer Neg Hx    Pancreatic cancer Neg Hx    Stomach cancer Neg Hx    Liver disease Neg Hx    PE: BP 120/76 (BP Location: Right Arm, Patient Position: Sitting, Cuff Size: Normal)   Pulse (!) 105   Ht 5\' 6"  (1.676 m)   Wt 169 lb 6.4 oz (76.8 kg)   SpO2 96%   BMI 27.34 kg/m  Wt Readings from Last 3 Encounters:  05/24/22 169 lb 6.4 oz (76.8 kg)  03/04/22 174 lb 3.2 oz (79 kg)  02/15/22 172 lb 11.2 oz (78.3 kg)   Constitutional: normal weight, in NAD Eyes:EOMI, no exophthalmos ENT: moist mucous membranes, no thyromegaly, no cervical lymphadenopathy Cardiovascular: tachycardia, RR, No MRG, + B LE edema and varicose veins Respiratory: CTA B Musculoskeletal: no deformities, strength intact in all 4 Skin: moist, warm, no rashes Neurological: no tremor with outstretched hands  ASSESSMENT: 1. LADA, insulin-dependent, uncontrolled, with complications - DKA in the setting of dehydration (- PN) - this may be 2/2 ChTx.  Pulmonologist: Dr. Chase Caller Oncologist: Dr. Earlie Server Cardiologist: Dr. Sallyanne Kuster Ophthalmologist: Dr. Clifton James  2.  Peripheral neuropathy -Possibly from Diabetes  PLAN:  1. Patient with longstanding, poorly controlled LADA, with very fluctuating CBGs, on basal/bolus insulin regimen.  At last visit, almost all of her blood sugars are higher than target, with instances of blood sugars in the target range especially before breakfast and in the afternoon.  However, sugars are continuing to increase after every meal.  She was forgetting insulin frequently or taking the  NovoLog after the meal.  We discussed about the importance of taking it 15 minutes before the meals.  She was also eating a lot of sweets.  I advised her to increase [place] and take 5 units of NovoLog before a snack or dessert. CGM interpretation: -At today's visit, we reviewed her CGM downloads: It appears that 39% of values are in target range (goal >70%), while 61% are higher than 180 (goal <25%), and 0% are lower than 70 (goal <4%).  The calculated average blood sugar is 207.  The projected HbA1c for the next 3 months (GMI) is 8.3%. -Reviewing the CGM trends, it appears that sugars are slightly better than before, with the best blood sugars after breakfast.  Upon questioning, in the last 2 weeks, she changed her breakfast from cereals or fruit cup to yogurt with fresh fruit.  This made a dramatic change in her blood sugars.  I strongly advised her to continue.  We will continue the same dose of NovoLog before this meal.  Before lunch, she is taking a lower dose of NovoLog and recommended.  As a consequence, sugars after this meal are higher.  I advised her to increase the lunchtime NovoLog dose.  Before dinner, she is taking the lower dose recommended and I advised  her that she can go up on this dose depending on the size of her meal.  As of now, sugars are usually higher at night and she would benefit from more insulin before dinner.  To bring all of her blood sugars lower, I also advised her to increase Tresiba to the dose recommended at last visit, states she is taking less now. - I suggested to:  Patient Instructions  Please increase: - Tresiba 36 units daily - NovoLog 15 minutes before a meal: B - 18 units L - 18-20 units D - 18-22 units  You may need 5 units of Novolog before a snack or dessert.              Please do not skip the insulin if sugars are 70-100.              If the sugars are 60-70, take 1/2 of the Humalog dose.             If the sugars are <60, skip insulin with that  meal If you take Novolog after a meal, take no more than 10 units.             No Novolog at bedtime.             Please return in 3-4 months.   - we checked her HbA1c: 8.1% (a little lower) - advised to check sugars at different times of the day - 4x a day, rotating check times - advised for yearly eye exams >> she is UTD - return to clinic in 3-4 months  2. PN  -She has numbness in her feet-stable, no pain -Previously on Lyrica, then came off.  On Cymbalta  3.  Hyperlipidemia -Reviewed latest lipid panel from 02/2022: LDL close to our goal of less than 70, the rest of the fractions at goal: Lab Results  Component Value Date   CHOL 143 03/25/2022   HDL 55.60 03/25/2022   LDLCALC 74 03/25/2022   LDLDIRECT 156.0 05/10/2019   TRIG 67.0 03/25/2022   CHOLHDL 3 03/25/2022  -She continues on pravastatin 40 mg daily without side effects  Megan Kingdom, MD PhD [facility]

## 2022-05-25 DIAGNOSIS — E1140 Type 2 diabetes mellitus with diabetic neuropathy, unspecified: Secondary | ICD-10-CM | POA: Diagnosis not present

## 2022-06-02 ENCOUNTER — Other Ambulatory Visit: Payer: Self-pay | Admitting: *Deleted

## 2022-06-02 NOTE — Patient Outreach (Signed)
  Care Coordination   06/02/2022 Name: Megan [name] MRN: [id number] DOB: 19-Mar-1941   Care Coordination Outreach Attempts:  An unsuccessful telephone outreach was attempted today to offer the patient information about available care coordination services as a benefit of their health plan.   Follow Up Plan:  Additional outreach attempts will be made to offer the patient care coordination information and services.   Encounter Outcome:  No Answer  Care Coordination Interventions Activated:  Yes   Care Coordination Interventions:  No, not indicated    [facility] [phone]

## 2022-06-02 NOTE — Progress Notes (Signed)
Subjective:    Patient ID: Megan [name], female    DOB: 1941-03-30, 81 y.o.   MRN: [id number]     HPI Megan [name] is here for follow up of her chronic medical problems, including DM  LADA type 1 - currently following with endocrine, but would like to change to a different endocrinologist.  She is using Dexcom.  Her last A1c was 8.1%.  She admits she is not always compliant with a diabetic diet.  She is taking her medication as prescribed.  Currently she is taking [place] 36-38 units once daily    Medications and allergies reviewed with patient and updated if appropriate.  Current Outpatient Medications on File Prior to Visit  Medication Sig Dispense Refill   albuterol (PROVENTIL HFA;VENTOLIN HFA) 108 (90 Base) MCG/ACT inhaler Inhale 2 puffs into the lungs every 6 (six) hours as needed for wheezing or shortness of breath. 1 Inhaler 2   amLODipine (NORVASC) 10 MG tablet TAKE 1 TABLET BY MOUTH EVERY DAY 90 tablet 1   augmented betamethasone dipropionate (DIPROLENE-AF) 0.05 % ointment      B-D UF III MINI PEN NEEDLES 31G X 5 MM MISC USE AS DIRECTED WITH INSULIN 4 TIMES A DAY 100 each 3   betamethasone dipropionate (DIPROLENE) 0.05 % cream Apply topically 2 (two) times daily. (Patient taking differently: Apply 1 application  topically 2 (two) times daily.) 45 g 0   budesonide-formoterol (SYMBICORT) 80-4.5 MCG/ACT inhaler Inhale 2 puffs into the lungs 2 (two) times daily. (Patient taking differently: Inhale 2 puffs into the lungs daily as needed (Wheezing).) 1 Inhaler 6   Continuous Blood Gluc Sensor (DEXCOM G6 SENSOR) MISC Apply new sensor every 10 days for continuous glucose monitoring. 9 each 3   Continuous Blood Gluc Transmit (DEXCOM G6 TRANSMITTER) MISC Use one transmitter every 3 months for continuous glucose monitoring. 1 each 0   DULoxetine (CYMBALTA) 60 MG capsule TAKE 1 CAPSULE BY MOUTH 2 TIMES DAILY. 180 capsule 0   Fluocinolone Acetonide Scalp 0.01 % OIL       fluocinonide (LIDEX) 0.05 % external solution      GVOKE HYPOPEN 1-PACK 1 MG/0.2ML SOAJ Inject under skin 0.2 ml as needed for hypoglycemia 0.2 mL prn   influenza vac split quadrivalent PF (FLUARIX) 0.5 ML injection PHARMACY ADMINISTERED     Influenza vac split quadrivalent PF (FLUZONE HIGH-DOSE) 0.5 ML injection ADM 0.5ML IM UTD     influenza vaccine adjuvanted (FLUAD QUADRIVALENT) 0.5 ML injection PHARMACY ADMINISTERED     insulin aspart (NOVOLOG FLEXPEN) 100 UNIT/ML FlexPen INJECT 18-22 UNITS UNDER THE SKIN THREE TIMES DAILY 45 mL 3   insulin degludec (TRESIBA FLEXTOUCH) 100 UNIT/ML FlexTouch Pen Inject up to 36-38 units into the skin daily 30 mL 3   itraconazole (SPORANOX) 100 MG capsule Take 2 tabs po bid x 1 week, then no medications for 3 weeks. Then repeat 56 capsule 0   ketoconazole (NIZORAL) 2 % cream      OTEZLA 30 MG TABS      pravastatin (PRAVACHOL) 40 MG tablet TAKE 1 TABLET(40 MG) BY MOUTH EVERY EVENING 90 tablet 1   No current facility-administered medications on file prior to visit.     Review of Systems     Objective:   Vitals:   06/03/22 1422  BP: 102/60  Pulse: 100  Temp: 98.6 F (37 C)  SpO2: 95%   BP Readings from Last 3 Encounters:  06/03/22 102/60  05/24/22 120/76  03/04/22 132/72  Wt Readings from Last 3 Encounters:  06/03/22 179 lb (81.2 kg)  05/24/22 169 lb 6.4 oz (76.8 kg)  03/04/22 174 lb 3.2 oz (79 kg)   Body mass index is 28.89 kg/m.    Physical Exam Constitutional:      Appearance: Normal appearance.  HENT:     Head: Normocephalic and atraumatic.  Musculoskeletal:     Right lower leg: Edema (Trace) present.     Left lower leg: Edema (Trace) present.  Skin:    General: Skin is warm and dry.  Neurological:     Mental Status: She is alert.  Psychiatric:        Mood and Affect: Mood normal.        Lab Results  Component Value Date   WBC 9.2 02/11/2022   HGB 13.8 02/11/2022   HCT 43.3 02/11/2022   PLT 217 02/11/2022    GLUCOSE 364 (H) 02/11/2022   CHOL 143 03/25/2022   TRIG 67.0 03/25/2022   HDL 55.60 03/25/2022   LDLDIRECT 156.0 05/10/2019   LDLCALC 74 03/25/2022   ALT 23 03/25/2022   AST 24 03/25/2022   NA 136 02/11/2022   K 4.1 02/11/2022   CL 100 02/11/2022   CREATININE 0.93 02/11/2022   BUN 15 02/11/2022   CO2 31 02/11/2022   TSH 8.675 (H) 02/28/2016   INR 1.17 10/10/2018   HGBA1C 8.1 (A) 05/24/2022   MICROALBUR 9.4 (H) 10/09/2020     Assessment & Plan:    See Problem List for Assessment and Plan of chronic medical problems.

## 2022-06-03 ENCOUNTER — Ambulatory Visit: Payer: Medicare PPO | Admitting: *Deleted

## 2022-06-03 ENCOUNTER — Ambulatory Visit: Payer: Medicare PPO | Admitting: Internal Medicine

## 2022-06-03 ENCOUNTER — Encounter: Payer: Self-pay | Admitting: Internal Medicine

## 2022-06-03 VITALS — BP 102/60 | HR 100 | Temp 98.6°F | Ht 66.0 in | Wt 179.0 lb

## 2022-06-03 DIAGNOSIS — I10 Essential (primary) hypertension: Secondary | ICD-10-CM

## 2022-06-03 DIAGNOSIS — E782 Mixed hyperlipidemia: Secondary | ICD-10-CM

## 2022-06-03 DIAGNOSIS — E139 Other specified diabetes mellitus without complications: Secondary | ICD-10-CM | POA: Diagnosis not present

## 2022-06-03 NOTE — Chronic Care Management (AMB) (Signed)
Care Management    RN Visit Note  06/03/2022 Name: Megan [name] MRN: [id number] DOB: May 03, 1941  Subjective: Megan [name] is a 81 y.o. year old female who is a primary care patient of Burns, Bobette Mo, MD. The care management team was consulted for assistance with disease management and care coordination needs.    Engaged with patient by telephone for follow up visit/ RN CM case closure in response to provider referral for case management and/or care coordination services.   Consent to Services:   Ms. Warriner was given information about Care Management services 06/16/21 including:  Care Management services includes personalized support from designated clinical staff supervised by her physician, including individualized plan of care and coordination with other care providers 24/7 contact phone numbers for assistance for urgent and routine care needs. The patient may stop case management services at any time by phone call to the office staff.  Patient agreed to services and consent obtained.   Assessment: Review of patient past medical history, allergies, medications, health status, including review of consultants reports, laboratory and other test data, was performed as part of comprehensive evaluation and provision of chronic care management services.   SDOH (Social Determinants of Health) assessments and interventions performed:  SDOH Interventions    Flowsheet Row Most Recent Value  SDOH Interventions   Food Insecurity Interventions Intervention Not Indicated  Transportation Interventions Intervention Not Indicated  [reports continues to drive self]     Care Plan  Allergies  Allergen Reactions   Carboplatin Anaphylaxis, Shortness Of Breath and Other (See Comments)    Neuropathy   Remicade [Infliximab] Anaphylaxis   Other    Rifampin Other (See Comments)    unknown   Hydromorphone Rash   Outpatient Encounter Medications as of 06/03/2022  Medication Sig Note    albuterol (PROVENTIL HFA;VENTOLIN HFA) 108 (90 Base) MCG/ACT inhaler Inhale 2 puffs into the lungs every 6 (six) hours as needed for wheezing or shortness of breath.    amLODipine (NORVASC) 10 MG tablet TAKE 1 TABLET BY MOUTH EVERY DAY    augmented betamethasone dipropionate (DIPROLENE-AF) 0.05 % ointment     B-D UF III MINI PEN NEEDLES 31G X 5 MM MISC USE AS DIRECTED WITH INSULIN 4 TIMES A DAY    betamethasone dipropionate (DIPROLENE) 0.05 % cream Apply topically 2 (two) times daily. (Patient taking differently: Apply 1 application. topically 2 (two) times daily.)    budesonide-formoterol (SYMBICORT) 80-4.5 MCG/ACT inhaler Inhale 2 puffs into the lungs 2 (two) times daily. (Patient taking differently: Inhale 2 puffs into the lungs daily as needed (Wheezing).) 01/24/2022: Reports uses as needed   Continuous Blood Gluc Sensor (DEXCOM G6 SENSOR) MISC Apply new sensor every 10 days for continuous glucose monitoring.    Continuous Blood Gluc Transmit (DEXCOM G6 TRANSMITTER) MISC Use one transmitter every 3 months for continuous glucose monitoring.    DULoxetine (CYMBALTA) 60 MG capsule TAKE 1 CAPSULE BY MOUTH 2 TIMES DAILY.    Fluocinolone Acetonide Scalp 0.01 % OIL     fluocinonide (LIDEX) 0.05 % external solution     GVOKE HYPOPEN 1-PACK 1 MG/0.2ML SOAJ Inject under skin 0.2 ml as needed for hypoglycemia 01/24/2022: Reports has not needed "in a long time"   insulin aspart (NOVOLOG FLEXPEN) 100 UNIT/ML FlexPen INJECT 18-22 UNITS UNDER THE SKIN THREE TIMES DAILY    insulin degludec (TRESIBA FLEXTOUCH) 100 UNIT/ML FlexTouch Pen Inject up to 36-38 units into the skin daily    itraconazole (SPORANOX) 100 MG capsule  Take 2 tabs po bid x 1 week, then no medications for 3 weeks. Then repeat    ketoconazole (NIZORAL) 2 % cream     OTEZLA 30 MG TABS     pravastatin (PRAVACHOL) 40 MG tablet TAKE 1 TABLET(40 MG) BY MOUTH EVERY EVENING    No facility-administered encounter medications on file as of 06/03/2022.    Patient Active Problem List   Diagnosis Date Noted   Onychomycosis of nail of digit of hand 03/03/2022   Vitamin D deficiency 10/12/2021   Mixed hyperlipidemia 10/12/2021   Frequent UTI 10/12/2021   Aortic atherosclerosis ([place]) 10/09/2020   Fall 05/04/2020   Head pain 05/10/2019   GERD (gastroesophageal reflux disease) 10/17/2018   Herpes zoster without complication 34/19/6222   Right elbow pain 03/01/2018   Carpal tunnel syndrome on right 11/30/2017   Cough 08/22/2017   Hypokalemia 03/22/2017   Primary osteoarthritis of both hands 03/08/2017   Poor balance 02/07/2017   Subarachnoid bleed ([place]) 01/11/2017   Psoriatic arthropathy ([place]) 09/08/2016   Hypertension 09/08/2016   Diabetic neuropathy ([place]) 09/08/2016   LADA (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), managed as type 1 ([place]) 05/04/2016   Syncope 03/25/2016   RLS (restless legs syndrome) 12/11/2015   Dysuria 07/07/2015   Lumbago 05/06/2015   Peripheral edema 04/14/2015   Psoriasis 04/14/2015   Emphysema of lung ([place]) 11/26/2014   Radiation-induced esophageal stricture 10/01/2014   DNR (do not resuscitate) discussion 08/21/2014   Fatigue 07/18/2014   Neuropathy due to chemotherapeutic drug ([place]) 07/18/2014   Muscle cramping 07/18/2014   Dyspnea 04/28/2014   Radiation esophagitis 04/10/2014   Cancer of middle lobe of lung ([place]) 01/17/2014   Malignant neoplasm of right upper lobe of lung ([place]) 11/28/2013   Conditions to be addressed/monitored: HTN, HLD, and DMII  Care Plan : RN Care Manager Plan of Care  Updates made by Knox Royalty, RN since 06/03/2022 12:00 AM     Problem: Chronic Disease Management Needs   Priority: Medium     Long-Range Goal: Ongoing adherence to established plan of care for long term chronic disease management   Start Date: 08/04/2021  Expected End Date: 08/04/2022  Note:   Current Barriers:  Chronic Disease Management support and education needs related to HTN and DMII History of  frequent falls- no falls reported in > 2 years, but reports ongoing balance issues: does not routinely use assistive devices for ambulation 01/24/22: reports today no new/ recent falls since last outreach 08/04/21; does not use assistive devices; currently participating in physical therapy at [facility] and goes to gym regularly- trying to preserve balance 06/03/22: continues to deny new/ recent falls  RNCM Clinical Goal(s):  Patient will demonstrate ongoing health management independence HTN; DMII  through collaboration with RN Care manager, provider, and care team.   Interventions: 1:1 collaboration with primary care provider regarding development and update of comprehensive plan of care as evidenced by provider attestation and co-signature Inter-disciplinary care team collaboration (see longitudinal plan of care) Evaluation of current treatment plan related to  self management and patient's adherence to plan as established by provider CCM RN CM Initial assessment completed 08/04/22 Review of patient status, including review of consultants reports, relevant laboratory and other test results, and medications completed SDOH updated: no new/ unmet concerns identified Pain assessment updated: denies pain today Falls assessment updated: denies new/ recent falls x > 12 months- does not use assistive devices;  positive reinforcement provided with encouragement to continue efforts at fall prevention; previously provided  education around fall risks/ prevention reinforced Medications:  reports continues to independently self-manage and denies current concerns/ issues/ questions around medications; endorses adherence to taking all medications as prescribed Reviewed upcoming scheduled provider appointments: 06/03/22- PCP- states "need to have referral to new endocrinologist;" patient confirms is aware of all and has plans to attend as scheduled Reviewed recent endocrinology provider office visit 05/24/22;  patient denies questions and verbalizes good understanding of post-office visit instructions Discussed plans with patient for ongoing care management follow up- patient denies current care coordination/ care management needs and is agreeable to RN CM case closure today; verbalizes understanding to contact PCP or other care providers for any needs that arise in the future, and confirms she has contact information for all care providers     Diabetes:  (Status: 06/03/22: Goal Met.) Long Term Goal Lab Results  Component Value Date   HGBA1C 8.1 (A) 05/24/2022  Provided education to patient about basic DM disease process; Reviewed prescribed diet with patient low salt/ heart healthy, carbohydrate-modified/ low sugar; Counseled on importance of regular laboratory monitoring as prescribed;        Assessed social determinant of health barriers;        Reviewed with patient recent endocrinology provider office visit 05/24/22 Confirmed patient continues to monitor blood sugars using Dexcon- CGM: monitors several times each day; states "blood sugars are about right where they have been for many years now;" continues administering insulin independently Discussed with patient current/ ongoing dietary habits- she again admits to ongoing struggle around discretion, given she lives at [facility] and there are always sweets and deserts available: encouraged patient to continue efforts at portion control, discretion- she states "I do the best I can; I am glad my A1-C is holding steady" Assessed patient's understanding of meaning/ significance of A1-C values: good baseline understanding of same- Reviewed individual historical A1-C trends and provided education around correlation of A1-C value to blood sugar levels at home over 3 months  Hypertension/ HLD: (Status: 06/03/22: Goal Met.) Long Term Goal Last practice recorded BP readings:  BP Readings from Last 3 Encounters:  05/24/22 120/76  03/04/22 132/72  02/15/22  121/69  Most recent eGFR/CrCl:  Lab Results  Component Value Date   EGFR 60 (L) 09/08/2017    No components found for: CRCL  Evaluation of current treatment plan related to hypertension self management and patient's adherence to plan as established by provider;   Counseled on the importance of exercise goals with target of 150 minutes per week Discussed complications of poorly controlled blood pressure such as heart disease, stroke, circulatory complications, vision complications, kidney impairment, sexual dysfunction;  Reinforced previously provided education around benefit of following heart healthy, low salt, low cholesterol diet in setting of HTN; continues to endorse that she does not add salt to prepared food Confirmed patient does not routinely monitor blood pressures at home, and remains not interested in starting to do so Confirmed patient continues to stay active- takes her dog to dog park each morning; she has an ongoing balance issue which limits her ability to perform exercise-- but she has started working with PT at [facility] and has started getting structured activity at [employer], also reports walking the halls inside where there is air conditioning during hot weather      Plan:  No further follow up required: patient denies current care coordination/ care management needs and is agreeable to RN CM case closure today; RN CM case closure accordingly     Richarda Osmond  Jamse Arn, RN, BSN, [facility] RN Care Coordination- [place] [phone]: direct office

## 2022-06-03 NOTE — Patient Instructions (Addendum)
    A referral was ordered for [facility]..     Someone from that office will call you to schedule an appointment.    Return in about 6 months (around 12/04/2022) for follow up.

## 2022-06-03 NOTE — Assessment & Plan Note (Signed)
Chronic Complications of referral neuropathy, which may also be secondary to chemotherapy Management per endocrine Last A1c 8.1%-sugars not ideally controlled Unfortunately she is not compliant with a diabetic diet She is taking her medication as prescribed-currently on Tresiba 36-38 units once daily Would like to transfer her to a different endocrinologist-referral ordered

## 2022-06-21 DIAGNOSIS — N390 Urinary tract infection, site not specified: Secondary | ICD-10-CM | POA: Diagnosis not present

## 2022-06-27 DIAGNOSIS — E118 Type 2 diabetes mellitus with unspecified complications: Secondary | ICD-10-CM | POA: Diagnosis not present

## 2022-06-27 DIAGNOSIS — E782 Mixed hyperlipidemia: Secondary | ICD-10-CM | POA: Diagnosis not present

## 2022-06-27 DIAGNOSIS — E139 Other specified diabetes mellitus without complications: Secondary | ICD-10-CM | POA: Diagnosis not present

## 2022-06-27 DIAGNOSIS — I10 Essential (primary) hypertension: Secondary | ICD-10-CM | POA: Diagnosis not present

## 2022-06-27 DIAGNOSIS — E1165 Type 2 diabetes mellitus with hyperglycemia: Secondary | ICD-10-CM | POA: Diagnosis not present

## 2022-07-04 ENCOUNTER — Other Ambulatory Visit: Payer: Self-pay | Admitting: Neurology

## 2022-07-14 ENCOUNTER — Encounter: Payer: Self-pay | Admitting: Internal Medicine

## 2022-07-14 NOTE — Progress Notes (Unsigned)
    Subjective:    Patient ID: Megan [name], female    DOB: 1940-12-13, 81 y.o.   MRN: [id number]      HPI Megan [name] is here for No chief complaint on file.    Growth on abdomen    Medications and allergies reviewed with patient and updated if appropriate.  Current Outpatient Medications on File Prior to Visit  Medication Sig Dispense Refill   albuterol (PROVENTIL HFA;VENTOLIN HFA) 108 (90 Base) MCG/ACT inhaler Inhale 2 puffs into the lungs every 6 (six) hours as needed for wheezing or shortness of breath. 1 Inhaler 2   amLODipine (NORVASC) 10 MG tablet TAKE 1 TABLET BY MOUTH EVERY DAY 90 tablet 1   augmented betamethasone dipropionate (DIPROLENE-AF) 0.05 % ointment      B-D UF III MINI PEN NEEDLES 31G X 5 MM MISC USE AS DIRECTED WITH INSULIN 4 TIMES A DAY 100 each 3   betamethasone dipropionate (DIPROLENE) 0.05 % cream Apply topically 2 (two) times daily. (Patient taking differently: Apply 1 application  topically 2 (two) times daily.) 45 g 0   budesonide-formoterol (SYMBICORT) 80-4.5 MCG/ACT inhaler Inhale 2 puffs into the lungs 2 (two) times daily. (Patient taking differently: Inhale 2 puffs into the lungs daily as needed (Wheezing).) 1 Inhaler 6   Continuous Blood Gluc Sensor (DEXCOM G6 SENSOR) MISC Apply new sensor every 10 days for continuous glucose monitoring. 9 each 3   Continuous Blood Gluc Transmit (DEXCOM G6 TRANSMITTER) MISC Use one transmitter every 3 months for continuous glucose monitoring. 1 each 0   DULoxetine (CYMBALTA) 60 MG capsule TAKE 1 CAPSULE BY MOUTH TWICE A DAY 180 capsule 0   Fluocinolone Acetonide Scalp 0.01 % OIL      fluocinonide (LIDEX) 0.05 % external solution      GVOKE HYPOPEN 1-PACK 1 MG/0.2ML SOAJ Inject under skin 0.2 ml as needed for hypoglycemia 0.2 mL prn   influenza vac split quadrivalent PF (FLUARIX) 0.5 ML injection PHARMACY ADMINISTERED     Influenza vac split quadrivalent PF (FLUZONE HIGH-DOSE) 0.5 ML injection ADM 0.5ML IM UTD      influenza vaccine adjuvanted (FLUAD QUADRIVALENT) 0.5 ML injection PHARMACY ADMINISTERED     insulin aspart (NOVOLOG FLEXPEN) 100 UNIT/ML FlexPen INJECT 18-22 UNITS UNDER THE SKIN THREE TIMES DAILY 45 mL 3   insulin degludec (TRESIBA FLEXTOUCH) 100 UNIT/ML FlexTouch Pen Inject up to 36-38 units into the skin daily 30 mL 3   ketoconazole (NIZORAL) 2 % cream      OTEZLA 30 MG TABS      pravastatin (PRAVACHOL) 40 MG tablet TAKE 1 TABLET(40 MG) BY MOUTH EVERY EVENING 90 tablet 1   No current facility-administered medications on file prior to visit.    Review of Systems     Objective:  There were no vitals filed for this visit. BP Readings from Last 3 Encounters:  06/03/22 102/60  05/24/22 120/76  03/04/22 132/72   Wt Readings from Last 3 Encounters:  06/03/22 179 lb (81.2 kg)  05/24/22 169 lb 6.4 oz (76.8 kg)  03/04/22 174 lb 3.2 oz (79 kg)   There is no height or weight on file to calculate BMI.    Physical Exam         Assessment & Plan:    See Problem List for Assessment and Plan of chronic medical problems.

## 2022-07-15 ENCOUNTER — Ambulatory Visit: Payer: Medicare PPO | Admitting: Internal Medicine

## 2022-07-15 VITALS — BP 132/68 | HR 110 | Temp 97.9°F | Ht 66.0 in | Wt 172.0 lb

## 2022-07-15 DIAGNOSIS — I10 Essential (primary) hypertension: Secondary | ICD-10-CM | POA: Diagnosis not present

## 2022-07-15 DIAGNOSIS — Z23 Encounter for immunization: Secondary | ICD-10-CM | POA: Diagnosis not present

## 2022-07-15 DIAGNOSIS — L570 Actinic keratosis: Secondary | ICD-10-CM | POA: Diagnosis not present

## 2022-07-15 NOTE — Assessment & Plan Note (Signed)
Chronic BP well controlled Continue amlodipine 10 mg daily

## 2022-07-15 NOTE — Patient Instructions (Addendum)
      Medications changes include :   none   Return for follow up as scheduled.

## 2022-07-15 NOTE — Assessment & Plan Note (Signed)
Acute Large keratic lesion on left mid abdomen - would advise seeing derm whom she sees regularly - likely benign but given growth and her wanting it removed - needs to see derm - she agrees

## 2022-07-20 DIAGNOSIS — N3281 Overactive bladder: Secondary | ICD-10-CM | POA: Diagnosis not present

## 2022-07-20 DIAGNOSIS — N3941 Urge incontinence: Secondary | ICD-10-CM | POA: Diagnosis not present

## 2022-07-20 DIAGNOSIS — R351 Nocturia: Secondary | ICD-10-CM | POA: Diagnosis not present

## 2022-07-20 DIAGNOSIS — Z87440 Personal history of urinary (tract) infections: Secondary | ICD-10-CM | POA: Diagnosis not present

## 2022-07-20 DIAGNOSIS — N952 Postmenopausal atrophic vaginitis: Secondary | ICD-10-CM | POA: Diagnosis not present

## 2022-07-25 ENCOUNTER — Telehealth: Payer: Medicare PPO

## 2022-08-15 ENCOUNTER — Other Ambulatory Visit: Payer: Self-pay | Admitting: Internal Medicine

## 2022-08-15 DIAGNOSIS — E1140 Type 2 diabetes mellitus with diabetic neuropathy, unspecified: Secondary | ICD-10-CM | POA: Diagnosis not present

## 2022-08-17 ENCOUNTER — Other Ambulatory Visit: Payer: Self-pay | Admitting: Internal Medicine

## 2022-08-17 ENCOUNTER — Other Ambulatory Visit: Payer: Self-pay | Admitting: Neurology

## 2022-09-16 ENCOUNTER — Other Ambulatory Visit: Payer: Self-pay | Admitting: Internal Medicine

## 2022-10-13 ENCOUNTER — Telehealth: Payer: Self-pay | Admitting: Internal Medicine

## 2022-10-13 NOTE — Telephone Encounter (Signed)
PT calls today in regards to an RX request for HYDROcodone-acetaminophen (HYCET) 7.5-325 MG/15 ml solution.  I informed PT that this would not be able to be prescribed without a visit. I informed PT of next available options for visits and they had declined setting anything up. Stated they would try to reach out to the nurses at the living facility she was at.  She did want to make sure Dr.Burns was aware of current symptoms due to PT's history with lung cancer. PT has been dealing with dry cough, minimal mucus build up, loss of voice for the last 24 hours. No body ache/pain or fever.  CB if needed: [phone]

## 2022-10-13 NOTE — Telephone Encounter (Signed)
I believe at [facility] they have a nurse practitioner that might be able to evaluate her and treat her appropriately which would be the best approach

## 2022-10-19 DIAGNOSIS — L820 Inflamed seborrheic keratosis: Secondary | ICD-10-CM | POA: Diagnosis not present

## 2022-10-19 DIAGNOSIS — E119 Type 2 diabetes mellitus without complications: Secondary | ICD-10-CM | POA: Diagnosis not present

## 2022-10-19 DIAGNOSIS — Z961 Presence of intraocular lens: Secondary | ICD-10-CM | POA: Diagnosis not present

## 2022-10-19 DIAGNOSIS — H524 Presbyopia: Secondary | ICD-10-CM | POA: Diagnosis not present

## 2022-10-19 DIAGNOSIS — L400 Psoriasis vulgaris: Secondary | ICD-10-CM | POA: Diagnosis not present

## 2022-10-19 LAB — HM DIABETES EYE EXAM

## 2022-10-27 ENCOUNTER — Encounter: Payer: Self-pay | Admitting: Internal Medicine

## 2022-10-27 NOTE — Progress Notes (Signed)
Outside notes received. Information abstracted. Notes sent to scan.  

## 2022-11-02 DIAGNOSIS — E1165 Type 2 diabetes mellitus with hyperglycemia: Secondary | ICD-10-CM | POA: Diagnosis not present

## 2022-11-02 DIAGNOSIS — E139 Other specified diabetes mellitus without complications: Secondary | ICD-10-CM | POA: Diagnosis not present

## 2022-11-02 DIAGNOSIS — E782 Mixed hyperlipidemia: Secondary | ICD-10-CM | POA: Diagnosis not present

## 2022-11-02 DIAGNOSIS — Z23 Encounter for immunization: Secondary | ICD-10-CM | POA: Diagnosis not present

## 2022-11-02 DIAGNOSIS — E118 Type 2 diabetes mellitus with unspecified complications: Secondary | ICD-10-CM | POA: Diagnosis not present

## 2022-11-09 ENCOUNTER — Telehealth: Payer: Self-pay | Admitting: Internal Medicine

## 2022-11-09 NOTE — Telephone Encounter (Signed)
Spoke with patient today and she will wait until appointment next month to address.

## 2022-11-09 NOTE — Telephone Encounter (Signed)
Patient called and said that she needs a new referral put in because she hadnt been seen in 3 years for Rheumatology. She said Dr Estanislado Pandy. Sciatic rheumatoid arthritis

## 2022-11-10 DIAGNOSIS — L400 Psoriasis vulgaris: Secondary | ICD-10-CM | POA: Diagnosis not present

## 2022-11-13 DIAGNOSIS — E1140 Type 2 diabetes mellitus with diabetic neuropathy, unspecified: Secondary | ICD-10-CM | POA: Diagnosis not present

## 2022-11-30 DIAGNOSIS — I10 Essential (primary) hypertension: Secondary | ICD-10-CM | POA: Diagnosis not present

## 2022-11-30 DIAGNOSIS — E139 Other specified diabetes mellitus without complications: Secondary | ICD-10-CM | POA: Diagnosis not present

## 2022-11-30 DIAGNOSIS — E782 Mixed hyperlipidemia: Secondary | ICD-10-CM | POA: Diagnosis not present

## 2022-11-30 DIAGNOSIS — E1165 Type 2 diabetes mellitus with hyperglycemia: Secondary | ICD-10-CM | POA: Diagnosis not present

## 2022-12-05 ENCOUNTER — Encounter: Payer: Self-pay | Admitting: Internal Medicine

## 2022-12-05 DIAGNOSIS — N390 Urinary tract infection, site not specified: Secondary | ICD-10-CM | POA: Diagnosis not present

## 2022-12-05 NOTE — Patient Instructions (Addendum)
      Blood work was ordered.   The lab is on the first floor.    Medications changes include :       A referral was ordered for XXX.     Someone will call you to schedule an appointment.    Return in about 6 months (around 06/06/2023) for Physical Exam.

## 2022-12-05 NOTE — Progress Notes (Signed)
      Subjective:    Patient ID: Megan [name], female    DOB: 12-Mar-1941, 82 y.o.   MRN: [id number]     HPI [place] is here for follow up of her chronic medical problems, including DM, htn, hld, vit d def  Lumbar radiculopathy-  ?  Prescribing Otezla  Medications and allergies reviewed with patient and updated if appropriate.  Current Outpatient Medications on File Prior to Visit  Medication Sig Dispense Refill  . augmented betamethasone dipropionate (DIPROLENE-AF) 0.05 % ointment     . B-D UF III MINI PEN NEEDLES 31G X 5 MM MISC USE AS DIRECTED WITH INSULIN 4 TIMES A DAY 100 each 3  . betamethasone dipropionate (DIPROLENE) 0.05 % cream Apply topically 2 (two) times daily. 45 g 0  . Continuous Blood Gluc Sensor (DEXCOM G6 SENSOR) MISC Apply new sensor every 10 days for continuous glucose monitoring. 9 each 3  . Continuous Blood Gluc Transmit (DEXCOM G6 TRANSMITTER) MISC Use one transmitter every 3 months for continuous glucose monitoring. 1 each 0  . Fluocinolone Acetonide Scalp 0.01 % OIL     . fluocinonide (LIDEX) 0.05 % external solution     . GVOKE HYPOPEN 1-PACK 1 MG/0.2ML SOAJ Inject under skin 0.2 ml as needed for hypoglycemia 0.2 mL prn  . insulin aspart (NOVOLOG FLEXPEN) 100 UNIT/ML FlexPen INJECT 18-22 UNITS UNDER THE SKIN THREE TIMES DAILY 45 mL 3  . insulin degludec (TRESIBA FLEXTOUCH) 100 UNIT/ML FlexTouch Pen Inject up to 36-38 units into the skin daily 30 mL 3  . ketoconazole (NIZORAL) 2 % cream     . OTEZLA 30 MG TABS      No current facility-administered medications on file prior to visit.     Review of Systems     Objective:  There were no vitals filed for this visit. BP Readings from Last 3 Encounters:  06/14/23 114/74  04/06/23 104/74  02/15/23 129/80   Wt Readings from Last 3 Encounters:  06/14/23 167 lb (75.8 kg)  04/06/23 172 lb (78 kg)  02/15/23 172 lb 3.2 oz (78.1 kg)   There is no height or weight on file to calculate BMI.     Physical Exam     Lab Results  Component Value Date   WBC 11.2 (H) 06/14/2023   HGB 14.0 06/14/2023   HCT 43.7 06/14/2023   PLT 284.0 06/14/2023   GLUCOSE 80 06/14/2023   CHOL 170 06/14/2023   TRIG 171.0 (H) 06/14/2023   HDL 47.50 06/14/2023   LDLDIRECT 156.0 05/10/2019   LDLCALC 88 06/14/2023   ALT 15 06/14/2023   AST 18 06/14/2023   NA 141 06/14/2023   K 3.4 (L) 06/14/2023   CL 101 06/14/2023   CREATININE 0.94 06/14/2023   BUN 17 06/14/2023   CO2 32 06/14/2023   TSH 2.47 06/14/2023   INR 1.17 10/10/2018   HGBA1C 7.8 (H) 06/14/2023   MICROALBUR 1.3 06/14/2023     Assessment & Plan:    See Problem List for Assessment and Plan of chronic medical problems.    This encounter was created in error - please disregard.

## 2022-12-06 ENCOUNTER — Encounter: Payer: Medicare PPO | Admitting: Internal Medicine

## 2022-12-06 DIAGNOSIS — E782 Mixed hyperlipidemia: Secondary | ICD-10-CM

## 2022-12-06 DIAGNOSIS — E139 Other specified diabetes mellitus without complications: Secondary | ICD-10-CM

## 2022-12-06 DIAGNOSIS — E559 Vitamin D deficiency, unspecified: Secondary | ICD-10-CM

## 2022-12-06 DIAGNOSIS — I10 Essential (primary) hypertension: Secondary | ICD-10-CM

## 2022-12-06 NOTE — Assessment & Plan Note (Signed)
Chronic Management per endocrine-following with Eilene Ghazi

## 2022-12-06 NOTE — Assessment & Plan Note (Signed)
Chronic On [place]

## 2022-12-06 NOTE — Assessment & Plan Note (Signed)
Chronic Regular exercise and healthy diet encouraged Check lipid panel  Continue pravastatin 40 mg daily

## 2022-12-06 NOTE — Assessment & Plan Note (Signed)
Chronic Taking vitamin D daily Check vitamin D level  

## 2022-12-06 NOTE — Assessment & Plan Note (Signed)
Chronic Blood pressure well controlled CMP Continue amlodipine 10 mg daily

## 2022-12-13 ENCOUNTER — Telehealth: Payer: Self-pay

## 2022-12-13 NOTE — Telephone Encounter (Signed)
No, she is not a good candidate for a pump.

## 2022-12-13 NOTE — Telephone Encounter (Signed)
Inbound request from DME supplier pt is requesting rx for medtronic pump.

## 2022-12-15 NOTE — Telephone Encounter (Signed)
Attempted to contact pt. Requested a call back.

## 2022-12-16 NOTE — Telephone Encounter (Signed)
Message sent to DME supplier provider dose not believe pt is good candidate for an insulin pump.

## 2022-12-23 NOTE — Progress Notes (Signed)
Office Visit Note  Patient: Megan [name]             Date of Birth: [birth year]           MRN: [id number]             PCP: Binnie Rail, MD Referring: Melvenia Beam, MD Visit Date: 01/06/2023 Occupation: '@GUAROCC'$ @  Subjective:  Pain in joints  History of Present Illness: Megan [name] is a 82 y.o. female she of psoriatic arthritis, psoriasis and osteoarthritis.  She returns today after her last visit in November 2018.  She was initially seen by me in 2014.  Patient stated that her symptoms started in 2003 with rash on her face and her feet.  She was initially treated with topical agents.  Then she started seeing Dr. Justine Null who diagnosed her with psoriatic arthritis and started on Remicade infusions.  She does not recall taking any other medications prior to Remicade.  He switched her to Humira in 2010 after she had anaphylactic reaction to Remicade and she did well on Humira.  She had mild flares of psoriasis only.  For the psoriasis she was followed by Dr. Ronnald Ramp.  At the time she was experiencing pain and popping of the right hip joint at that time.  She also had a recent diagnosis of diabetes at that time and diabetic neuropathy.  We continued Humira after she joined our practice in 2014.  In 2015 she was evaluated by Dr. Lynford Citizen due to cough.  She was referred to Dr. Julien Nordmann and for lung cancer.  She received chemotherapy and radiation therapy.  She was on Otezla by the dermatologist.  She last follows up in 2016.  She was on sulfasalazine 500 mg 2 tablets twice a day.  She came in after her initial visit in June 2016.  She gave history of right ankle joint fracture and left fibular fracture later metatarsal fractures associated with syncopal episodes and falls.  She had right foot reconstruction surgery in 2017 in.  At the time she was also on chemotherapy for lung cancer.  She was seeing Dr. Sharol Given for left knee joint severe osteoarthritis.  Does not recall when she stopped  sulfasalazine.  She continues to see Dr. Elvera Lennox at [facility] dermatology and receives [place].  She goes to Dr. Georgina Snell  and gets knee joint injections.  She states she continues to have discomfort in her lower back, bilateral hands which she describes over the [facility] joints, bilateral hip joints, bilateral knee joints and her feet.  She also has psoriasis on her scalp, back and her extremities.  She denies any history of joint swelling.  Any history of [place] fasciitis, Achilles tendinitis or uveitis.    Activities of Daily Living:  Patient reports morning stiffness for 0 minutes.   Patient Denies nocturnal pain.  Difficulty dressing/grooming: Denies Difficulty climbing stairs: Reports Difficulty getting out of chair: Denies Difficulty using hands for taps, buttons, cutlery, and/or writing: Reports  Review of Systems  Constitutional:  Positive for fatigue.  HENT: Negative.  Negative for mouth sores and mouth dryness.   Eyes: Negative.  Negative for dryness.  Respiratory:  Positive for shortness of breath.   Cardiovascular: Negative.  Negative for chest pain and palpitations.  Gastrointestinal:  Positive for constipation. Negative for blood in stool and diarrhea.  Endocrine: Negative for increased urination.  Genitourinary:  Positive for involuntary urination.  Musculoskeletal:  Positive for joint pain, gait problem and joint pain. Negative for joint  swelling, myalgias, muscle weakness, morning stiffness, muscle tenderness and myalgias.  Skin:  Positive for rash and hair loss. Negative for color change and sensitivity to sunlight.  Allergic/Immunologic: Positive for susceptible to infections.  Neurological:  Positive for dizziness. Negative for headaches.  Hematological: Negative.  Negative for swollen glands.  Psychiatric/Behavioral:  Positive for sleep disturbance. Negative for depressed mood. The patient is not nervous/anxious.     PMFS History:  Patient Active Problem List    Diagnosis Date Noted   Keratosis 07/15/2022   Onychomycosis of nail of digit of hand 03/03/2022   Nocturia 11/24/2021   OAB (overactive bladder) 11/24/2021   Urge incontinence 11/24/2021   Urinary urgency 11/24/2021   Vaginal atrophy 11/24/2021   Vitamin D deficiency 10/12/2021   Mixed hyperlipidemia 10/12/2021   Frequent UTI 10/12/2021   Aortic atherosclerosis ([place]) 10/09/2020   Fall [id number]   Complication associated with orthopedic device ([place]) 08/23/2019   Head pain 05/10/2019   GERD (gastroesophageal reflux disease) 10/17/2018   Herpes zoster without complication [id number]   Right elbow pain 03/01/2018   Carpal tunnel syndrome on right 11/30/2017   Cough 08/22/2017   Hypokalemia 03/22/2017   Primary osteoarthritis of both hands 03/08/2017   Poor balance 02/07/2017   Subarachnoid bleed ([place]) 01/11/2017   Psoriatic arthropathy ([place]) 09/08/2016   Hypertension 09/08/2016   Diabetic neuropathy ([place]) 09/08/2016   LADA (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), managed as type 1 ([facility]) 05/04/2016   Syncope 03/25/2016   RLS (restless legs syndrome) 12/11/2015   Lumbago 05/06/2015   Peripheral edema 04/14/2015   Psoriasis 04/14/2015   Emphysema of lung ([place]) 11/26/2014   Radiation-induced esophageal stricture 10/01/2014   DNR (do not resuscitate) discussion 08/21/2014   Fatigue 07/18/2014   Neuropathy due to chemotherapeutic drug ([place]) 07/18/2014   Muscle cramping 07/18/2014   Dyspnea 04/28/2014   Radiation esophagitis 04/10/2014   Cancer of middle lobe of lung ([employer]) 01/17/2014   Malignant neoplasm of right upper lobe of lung ([place]) 11/28/2013    Past Medical History:  Diagnosis Date   Cancer ([place]) dx'd 09/2013   Lung ca   Concussion 2018   Diabetes ([place]) 09/08/2016   type I diabetic patient reported on 09/13/16    Diabetes mellitus    insulin   Diabetic neuropathy ([place]) 09/08/2016   Fall 2018   GERD (gastroesophageal reflux disease)    no meds for   History of  colitis    History of hiatal hernia    Hx of radiation therapy 12/16/13-01/30/14   lung 66Gy   Hypertension    Malignant neoplasm of right upper lobe of lung ([place]) 11/28/2013   Neuropathy    Psoriasis    Psoriatic arthritis ([place]) 09/08/2016   Rheumatoid arthritis (HCC)    Shortness of breath dyspnea    with exertion    Family History  Problem Relation Age of Onset   Other Mother    Anuerysm Mother        brain   Other Father        failure to thrive   Hypertension Other    Stroke Other    Heart attack Other    Hemachromatosis Other        great grandfather   Rheum arthritis Other        both sets of grandparents and father   Migraines Neg Hx    Colon cancer Neg Hx    Colon polyps Neg Hx    Esophageal cancer Neg Hx  Pancreatic cancer Neg Hx    Stomach cancer Neg Hx    Liver disease Neg Hx    Past Surgical History:  Procedure Laterality Date   ABDOMINAL HYSTERECTOMY     ABDOMINAL SURGERY     BALLOON DILATION N/A 12/12/2014   Procedure: BALLOON DILATION;  Surgeon: Inda Castle, MD;  Location: [facility];  Service: Endoscopy;  Laterality: N/A;   BIOPSY  01/20/2020   Procedure: BIOPSY;  Surgeon: Doran Stabler, MD;  Location: [facility];  Service: Gastroenterology;;   CHOLECYSTECTOMY     COLONOSCOPY N/A 02/07/2016   Procedure: COLONOSCOPY;  Surgeon: Doran Stabler, MD;  Location: [place];  Service: Endoscopy;  Laterality: N/A;   DILATION AND CURETTAGE OF UTERUS     ESOPHAGOGASTRODUODENOSCOPY N/A 12/12/2014   Procedure: ESOPHAGOGASTRODUODENOSCOPY (EGD);  Surgeon: Inda Castle, MD;  Location: [facility];  Service: Endoscopy;  Laterality: N/A;  with dilation   ESOPHAGOGASTRODUODENOSCOPY (EGD) WITH PROPOFOL N/A 10/09/2014   Procedure: ESOPHAGOGASTRODUODENOSCOPY (EGD) WITH PROPOFOL;  Surgeon: Inda Castle, MD;  Location: [place];  Service: Endoscopy;  Laterality: N/A;   ESOPHAGOGASTRODUODENOSCOPY (EGD) WITH PROPOFOL N/A 09/19/2016   Procedure:  ESOPHAGOGASTRODUODENOSCOPY (EGD) WITH PROPOFOL;  Surgeon: Doran Stabler, MD;  [facility];  Service: Gastroenterology;  Laterality: N/A;  with savary dil tt   ESOPHAGOGASTRODUODENOSCOPY (EGD) WITH PROPOFOL N/A 01/20/2020   Procedure: ESOPHAGOGASTRODUODENOSCOPY (EGD) WITH PROPOFOL;  Surgeon: Doran Stabler, MD;  [facility];  Service: Gastroenterology;  Laterality: N/A;  Fluoro-[place]   ORIF ANKLE FRACTURE Right 07/04/2015   Procedure: OPEN REDUCTION INTERNAL FIXATION (ORIF) ANKLE FRACTURE;  Surgeon: Newt Minion, MD;  Location: [place];  Service: Orthopedics;  Laterality: Right;  OPEN REDUCTION, INTERNAL FIXATION OF RIGHT ANKLE FRACTURE.    ORIF ANKLE FRACTURE Left 02/10/2016   Procedure: OPEN REDUCTION INTERNAL FIXATION (ORIF) ANKLE FRACTURE;  Surgeon: Newt Minion, MD;  Location: [place];  Service: Orthopedics;  Laterality: Left;   SAVORY DILATION N/A 10/09/2014   Procedure: SAVORY DILATION;  Surgeon: Inda Castle, MD;  Location: [facility] [facility];  Service: Endoscopy;  Laterality: N/A;   SAVORY DILATION N/A 09/19/2016   Procedure: SAVORY DILATION;  Surgeon: Doran Stabler, MD;  [facility];  Service: Gastroenterology;  Laterality: N/A;   SAVORY DILATION N/A 01/20/2020   Procedure: SAVORY DILATION;  Surgeon: Doran Stabler, MD;  [facility];  Service: Gastroenterology;  Laterality: N/A;   TONSILLECTOMY     Social History   Social History Narrative   Patient lives at home. She has an old friend who is her caregiver, Gladys Damme, who stays with her 24/7.   Caffeine Use: 1 cups daily   Right handed   Immunization History  Administered Date(s) Administered   Fluad Quad(high Dose 65+) 07/31/2019, 10/01/2020, 07/15/2022   Influenza, High Dose Seasonal PF 08/12/2016, 08/30/2017, 09/07/2018, 08/24/2021   Influenza,inj,Quad PF,6+ Mos 11/07/2013, 07/14/2014, 08/25/2015   Influenza-Unspecified 08/07/2018, 09/07/2018   PFIZER(Purple  Top)SARS-COV-2 Vaccination 11/28/2019, 12/25/2019, 06/18/2020   PPD Test 03/02/2016   Pfizer Covid-19 Vaccine Bivalent Booster 70yr & up 07/27/2021   Pneumococcal Conjugate-13 05/10/2019   Pneumococcal Polysaccharide-23 03/19/2018   Pneumococcal-Unspecified 12/02/2015   Tdap 01/11/2017   Zoster Recombinat (Shingrix) 02/14/2018, 08/07/2018     Objective: Vital Signs: BP 105/69 (BP Location: Right Arm, Patient Position: Sitting, Cuff Size: Large)   Pulse (!) 116   Resp 18   Ht '5\' 5"'$  (1.651 m)   Wt 169 lb 9.6  oz (76.9 kg)   BMI 28.22 kg/m    Physical Exam Vitals and nursing note reviewed.  Constitutional:      Appearance: She is well-developed.  HENT:     Head: Normocephalic and atraumatic.  Eyes:     Conjunctiva/sclera: Conjunctivae normal.  Cardiovascular:     Rate and Rhythm: Normal rate and regular rhythm.     Heart sounds: Normal heart sounds.  Pulmonary:     Effort: Pulmonary effort is normal.     Breath sounds: Normal breath sounds.  Abdominal:     General: Bowel sounds are normal.     Palpations: Abdomen is soft.  Musculoskeletal:     Cervical back: Normal range of motion.  Lymphadenopathy:     Cervical: No cervical adenopathy.  Skin:    General: Skin is warm and dry.     Capillary Refill: Capillary refill takes less than 2 seconds.     Comments: Plaque psoriasis was noted on the scalp, on entire back, elbows and extremities  Neurological:     Mental Status: She is alert and oriented to person, place, and time.  Psychiatric:        Behavior: Behavior normal.      Musculoskeletal Exam: Cervical spine was in good range of motion.  She had thoracic kyphosis.  She symptoms limitation with range of motion of her lumbar spine.  She had tenderness over left SI joint.  Shoulder joints, elbow joints and wrist joints were in good range of motion.  She had bilateral CMC subluxation.  No MCP PIP or DIP swelling or synovitis was noted.  She had limited range of motion of  her left hip joint.  Right hip joint was in good range of motion.  Knee joints were in good range of motion with warmth on palpation.  She has some warmth on palpation of her ankle joints.  Bilateral ankle joints were subluxed.  She had bilateral pes planus send changes from previous surgeries.  CDAI Exam: CDAI Score: -- Patient Global: --; Provider Global: -- Swollen: --; Tender: -- Joint Exam 01/06/2023   No joint exam has been documented for this visit   There is currently no information documented on the homunculus. Go to the Rheumatology activity and complete the homunculus joint exam.  Investigation: No additional findings.  Imaging: No results found.  Recent Labs: Lab Results  Component Value Date   WBC 9.2 02/11/2022   HGB 13.8 02/11/2022   PLT 217 02/11/2022   NA 136 02/11/2022   K 4.1 02/11/2022   CL 100 02/11/2022   CO2 31 02/11/2022   GLUCOSE 364 (H) 02/11/2022   BUN 15 02/11/2022   CREATININE 0.93 02/11/2022   BILITOT 0.7 03/25/2022   ALKPHOS 146 (H) 03/25/2022   AST 24 03/25/2022   ALT 23 03/25/2022   PROT 7.3 03/25/2022   ALBUMIN 4.2 03/25/2022   CALCIUM 10.4 (H) 02/11/2022   GFRAA 56 (L) 02/03/2020    Speciality Comments: Remicade-anaphylaxis  Procedures:  No procedures performed Allergies: Carboplatin, Remicade [infliximab], Other, Rifampin, and Hydromorphone   Assessment / Plan:     Visit Diagnoses: Psoriatic arthropathy ([place]) - Last seen 09/08/17-lost f/u while undergoing tx for lung cancer.  Patient was initially diagnosed with psoriasis and psoriatic arthritis by Dr. Justine Null in 2004.  She was started on IV Remicade infusions and then later switched to Humira in 2010 after she had an anaphylactic reaction to IV Remicade.  She came to see me in 2014 at the time  she did not have any synovitis and we continued Humira.  She was diagnosed with lung cancer in 2015 by Dr. Julien Nordmann.  At that time Humira was stopped.  She underwent chemotherapy and radiation  therapy.  Patient states she did not finish chemotherapy as she had a lot of side effects.  She was on sulfasalazine for some time and saw me intermittently 1 time in 2016 and once in 2017 and then in 2018.  She has not been back since 2018.  She states she had been under care of Dr. Elvera Lennox at [facility] dermatology and has been getting [place] Nail through his office.  She states his [place] Nail has not been working and she has psoriasis rash all over her body.  She also experiences some discomfort in her joints which she describes in her bilateral CMC's, left hip, knee joints, feet and lower back.  She has not noticed any joint swelling.  She has been seeing Dr. Georgina Snell at [facility] gives her cortisone injections frequently.  She states she had reconstruction surgery on her feet which continues to bother her.  She is looking for other treatment options as her dermatologist did not offer any other treatments.  Patient did not have much synovitis except some warmth in her knee joints.  She also had some tenderness on palpation over left SI joint.  Her psoriasis is the most aggressive disease currently.  She may benefit from medications like a Restaurant manager, fast food.  Side effects affect his Orson Ape were discussed at length.  A handout on Orson Ape was given.  I advised her to discuss this further with Dr. Julien Nordmann as all immunosuppressive agents to some extent will increase the risk of lung cancer.  Patient has an appointment coming up with Dr. Julien Nordmann next month.  Once she gets an approval for [employer] she can get the prescription through her dermatologist.  Psoriasis-she had extensive psoriasis on her back, extremities and her scalp.  High risk medication use -Otezla 30 mg tablet by mouth twice a day prescribed by Dr. Elvera Lennox.  Primary osteoarthritis of both hands-she has severe osteoarthritis in her bilateral hands with CMC subluxation.  Primary osteoarthritis of both knees-patient has been followed by Dr.Corey and gets  cortisone injections as needed.  Pes planus of both feet-bilateral pes planus and also subluxation of bilateral ankles.  She had reconstruction surgery in bilateral feet by Dr. Sharol Given in the past.  She has difficulty walking due to feet problems.  No synovitis or dactylitis was noted.  Chronic midline low back pain without sciatica-she continues to have some lower back pain and some left SI joint pain.  She has been followed by [facility].  Other medical problems are listed as follows:  LADA (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), managed as type 1 ([place])  Malignant neoplasm of right upper lobe of lung ([facility])-diagnosed by Dr. Earlie Server in 2015.  She was treated with chemotherapy and radiation therapy.  Patient states she did not finish chemotherapy.  She has been followed by Dr. Julien Nordmann closely.  She has an appointment coming up next month.  Radiation-induced esophageal stricture  History of esophagitis  Neuropathy due to chemotherapeutic drug ([facility])  RLS (restless legs syndrome)  History of hypertension  History of depression  History of subarachnoid hemorrhage  Aortic atherosclerosis ([facility])  Former smoker - 135-pack-year smoking history  Family history of psoriasis in maternal grandmother  Orders: No orders of the defined types were placed in this encounter.  No orders of the defined types were placed in  this encounter.  Face-to-face time spent patient was 60 minutes.  More than 50% time was spent in counseling and coordination of care.  All the previous office records from 2014 were reviewed today.  Previous records from her visits with the pulmonologist and oncologist were reviewed today.  Follow-Up Instructions: Return in about 6 months (around 07/09/2023) for Psoriatic arthritis, Osteoarthritis.   Bo Merino, MD  Note - This record has been created using Editor, commissioning.  Chart creation errors have been sought, but may not always  have been located. Such creation  errors do not reflect on  the standard of medical care.

## 2022-12-28 ENCOUNTER — Telehealth: Payer: Self-pay

## 2022-12-28 NOTE — Patient Outreach (Signed)
  Care Coordination   12/28/2022 Name: Megan [name] MRN: [id number] DOB: 1941-06-17   Care Coordination Outreach Attempts:  An unsuccessful telephone outreach was attempted today to offer the patient information about available care coordination services as a benefit of their health plan.   Follow Up Plan:  Additional outreach attempts will be made to offer the patient care coordination information and services.   Encounter Outcome:  No Answer   Care Coordination Interventions:  No, not indicated    Thea Silversmith, RN, MSN, BSN, [place] Coordinator [phone]

## 2023-01-02 DIAGNOSIS — E139 Other specified diabetes mellitus without complications: Secondary | ICD-10-CM | POA: Diagnosis not present

## 2023-01-06 ENCOUNTER — Ambulatory Visit: Payer: Medicare PPO | Attending: Rheumatology | Admitting: Rheumatology

## 2023-01-06 ENCOUNTER — Encounter: Payer: Self-pay | Admitting: Rheumatology

## 2023-01-06 VITALS — BP 105/69 | HR 116 | Resp 18 | Ht 65.0 in | Wt 169.6 lb

## 2023-01-06 DIAGNOSIS — Z79899 Other long term (current) drug therapy: Secondary | ICD-10-CM

## 2023-01-06 DIAGNOSIS — Z8679 Personal history of other diseases of the circulatory system: Secondary | ICD-10-CM

## 2023-01-06 DIAGNOSIS — L409 Psoriasis, unspecified: Secondary | ICD-10-CM | POA: Diagnosis not present

## 2023-01-06 DIAGNOSIS — I700 Atherosclerosis of aorta: Secondary | ICD-10-CM

## 2023-01-06 DIAGNOSIS — K222 Esophageal obstruction: Secondary | ICD-10-CM

## 2023-01-06 DIAGNOSIS — Z862 Personal history of diseases of the blood and blood-forming organs and certain disorders involving the immune mechanism: Secondary | ICD-10-CM

## 2023-01-06 DIAGNOSIS — E139 Other specified diabetes mellitus without complications: Secondary | ICD-10-CM | POA: Diagnosis not present

## 2023-01-06 DIAGNOSIS — Z8719 Personal history of other diseases of the digestive system: Secondary | ICD-10-CM

## 2023-01-06 DIAGNOSIS — C3411 Malignant neoplasm of upper lobe, right bronchus or lung: Secondary | ICD-10-CM | POA: Diagnosis not present

## 2023-01-06 DIAGNOSIS — M792 Neuralgia and neuritis, unspecified: Secondary | ICD-10-CM

## 2023-01-06 DIAGNOSIS — G620 Drug-induced polyneuropathy: Secondary | ICD-10-CM

## 2023-01-06 DIAGNOSIS — M19041 Primary osteoarthritis, right hand: Secondary | ICD-10-CM

## 2023-01-06 DIAGNOSIS — M2141 Flat foot [pes planus] (acquired), right foot: Secondary | ICD-10-CM

## 2023-01-06 DIAGNOSIS — G2581 Restless legs syndrome: Secondary | ICD-10-CM

## 2023-01-06 DIAGNOSIS — Z87891 Personal history of nicotine dependence: Secondary | ICD-10-CM

## 2023-01-06 DIAGNOSIS — M19042 Primary osteoarthritis, left hand: Secondary | ICD-10-CM

## 2023-01-06 DIAGNOSIS — G8929 Other chronic pain: Secondary | ICD-10-CM

## 2023-01-06 DIAGNOSIS — G5601 Carpal tunnel syndrome, right upper limb: Secondary | ICD-10-CM

## 2023-01-06 DIAGNOSIS — M2142 Flat foot [pes planus] (acquired), left foot: Secondary | ICD-10-CM

## 2023-01-06 DIAGNOSIS — T451X5A Adverse effect of antineoplastic and immunosuppressive drugs, initial encounter: Secondary | ICD-10-CM

## 2023-01-06 DIAGNOSIS — L4050 Arthropathic psoriasis, unspecified: Secondary | ICD-10-CM

## 2023-01-06 DIAGNOSIS — M545 Low back pain, unspecified: Secondary | ICD-10-CM

## 2023-01-06 DIAGNOSIS — M170 Bilateral primary osteoarthritis of knee: Secondary | ICD-10-CM

## 2023-01-06 DIAGNOSIS — Z8659 Personal history of other mental and behavioral disorders: Secondary | ICD-10-CM

## 2023-01-06 DIAGNOSIS — Z840 Family history of diseases of the skin and subcutaneous tissue: Secondary | ICD-10-CM

## 2023-01-06 NOTE — Patient Instructions (Signed)
Risankizumab Injection What is this medication? RISANKIZUMAB (RIS an KIZ ue mab) treats autoimmune conditions, such as psoriasis, arthritis, and Crohn's disease. It works by slowing down an overactive immune system. It is a monoclonal antibody. This medicine may be used for other purposes; ask your health care provider or pharmacist if you have questions. COMMON BRAND NAME(S): Skyrizi What should I tell my care team before I take this medication? They need to know if you have any of these conditions: Hepatic disease Immune system problems Infection, such as tuberculosis (TB), bacterial, fungal or viral infections Recent or upcoming vaccine An unusual or allergic reaction to risankizumab, other medications, foods, dyes, or preservatives Pregnant or trying to get pregnant Breast-feeding How should I use this medication? This medication is injected into a vein or under the skin. It is given by your care team in a hospital or clinic setting. It may also be given at home. If you get this medication at home, you will be taught how to prepare and give it. Use exactly as directed. Take it as directed on the prescription label. Keep taking it unless your care team tells you to stop. If you use a pen, be sure to take off the outer needle cover before using the dose. It is important that you put your used needles and syringes in a special sharps container. Do not put them in a trash can. If you do not have a sharps container, call your pharmacist or care team to get one. A special MedGuide will be given to you by the pharmacist with each prescription and refill. Be sure to read this information carefully each time. This medication comes with INSTRUCTIONS FOR USE. Ask your pharmacist for directions on how to use this medication. Read the information carefully. Talk to your pharmacist or care team if you have questions. Talk to your care team about the use of this medication in children. Special care may be  needed. Overdosage: If you think you have taken too much of this medicine contact a poison control center or emergency room at once. NOTE: This medicine is only for you. Do not share this medicine with others. What if I miss a dose? It is important not to miss any doses. Talk to your care team about what to do if you miss a dose. What may interact with this medication? Do not take this medication with any of the following: Live vaccines This list may not describe all possible interactions. Give your health care provider a list of all the medicines, herbs, non-prescription drugs, or dietary supplements you use. Also tell them if you smoke, drink alcohol, or use illegal drugs. Some items may interact with your medicine. What should I watch for while using this medication? Visit your care team for regular checks on your progress. Tell your care team if your symptoms do not start to get better or if they get worse. You will be tested for tuberculosis (TB) before you start this medication. If your care team prescribes any medication for TB, you should start taking the TB medication before starting this medication. Make sure to finish the full course of TB medication. This medication may increase your risk of getting an infection. Call your care team for advice if you get a fever, chills, sore throat, or other symptoms of a cold or flu. Do not treat yourself. Try to avoid being around people who are sick. This medication can decrease the response to a vaccine. If you need to get   vaccinated, tell your care team if you have received this medication. Extra booster doses may be needed. Talk to your care team to see if a different vaccination schedule is needed. What side effects may I notice from receiving this medication? Side effects that you should report to your care team as soon as possible: Allergic reactions--skin rash, itching, hives, swelling of the face, lips, tongue, or throat Infection--fever,  chills, cough, sore throat, wounds that don't heal, pain or trouble when passing urine, general feeling of discomfort or being unwell Liver injury--right upper belly pain, loss of appetite, nausea, light-colored stool, dark yellow or brown urine, yellowing skin or eyes, unusual weakness or fatigue Side effects that usually do not require medical attention (report to your care team if they continue or are bothersome): Fatigue Headache Pain, redness, or irritation at injection site Runny or stuffy nose Sore throat This list may not describe all possible side effects. Call your doctor for medical advice about side effects. You may report side effects to FDA at 1-800-FDA-1088. Where should I keep my medication? Keep out of the reach of children and pets. Store in a refrigerator. Do not freeze. Protect from light. Keep it in the original carton until you are ready to take it. See product for storage information. Each product may have different instructions. Remove the dose from the carton about 30 to 45 minutes before it is time for you to take it. Get rid of any unused medication after the expiration date. To get rid of medications that are no longer needed or have expired: Take the medication to a medication take-back program. Check with your pharmacy or law enforcement to find a location. If you cannot return the medication, ask your pharmacist or care team how to get rid of this medication safely. NOTE: This sheet is a summary. It may not cover all possible information. If you have questions about this medicine, talk to your doctor, pharmacist, or health care provider.  2023 Elsevier/Gold Standard (2021-12-29 00:00:00)  

## 2023-01-09 ENCOUNTER — Telehealth: Payer: Self-pay

## 2023-01-09 NOTE — Patient Outreach (Signed)
  Care Coordination   01/09/2023 Name: Megan [name] MRN: [id number] DOB: 22-Apr-1941   Care Coordination Outreach Attempts:  A second unsuccessful outreach was attempted today to offer the patient with information about available care coordination services as a benefit of their health plan.     Follow Up Plan:  Additional outreach attempts will be made to offer the patient care coordination information and services.   Encounter Outcome:  No Answer   Care Coordination Interventions:  No, not indicated    Thea Silversmith, RN, MSN, BSN, [place] Coordinator [phone]

## 2023-01-13 DIAGNOSIS — R399 Unspecified symptoms and signs involving the genitourinary system: Secondary | ICD-10-CM | POA: Diagnosis not present

## 2023-01-17 ENCOUNTER — Telehealth: Payer: Self-pay | Admitting: *Deleted

## 2023-01-17 NOTE — Telephone Encounter (Signed)
[facility] [phone]  Called stating Dr. Trellis Moment reviewed the office notes. Dr. Pablo Ledger is requesting if patient has Psoriatic Arthritis we prescribe the Talt[employer] or [employer]. Please advise.

## 2023-01-24 ENCOUNTER — Other Ambulatory Visit: Payer: Self-pay | Admitting: Internal Medicine

## 2023-01-24 ENCOUNTER — Telehealth: Payer: Self-pay

## 2023-01-24 NOTE — Patient Outreach (Signed)
  Care Coordination   Initial Visit Note   01/24/2023 Name: Megan [name] MRN: [id number] DOB: 14-Jul-1941  Megan [name] is a 82 y.o. year old female who sees Burns, [name], MD for primary care. I spoke with  Megan [name] by phone today.  What matters to the patients health and wellness today?  Megan [name] states she does not have any care coordination needs and declines to complete telephone assessment, adding all her doctors take good care of her. RNCM reinforced Care coordination services available in future if needed.  Goals Addressed             This Visit's Progress    COMPLETED: Care coordination Activities       Interventions Today    Flowsheet Row Most Recent Value  General Interventions   General Interventions Discussed/Reviewed General Interventions Discussed  [briefly discussed care coordination program-Patient denies any needs and declines to complete assessment.]            SDOH assessments and interventions completed:  No  Care Coordination Interventions:  Yes, provided   Follow up plan: No further intervention required.   Encounter Outcome:  Pt. Visit Completed   Megan Silversmith, RN, MSN, BSN, [place] Coordinator [phone]

## 2023-01-25 NOTE — Telephone Encounter (Signed)
Left message for Megan [name] to return call to the office to discuss.

## 2023-01-25 NOTE — Telephone Encounter (Signed)
This patient gets clearance from her oncologist as requested at the prior visit, we can scribe Skyrizi.

## 2023-01-26 NOTE — Telephone Encounter (Signed)
Spoke with [facility] and advised when patient gets clearance from her oncologist as requested at the prior visit, we can scribe Skyrizi.

## 2023-02-02 DIAGNOSIS — E139 Other specified diabetes mellitus without complications: Secondary | ICD-10-CM | POA: Diagnosis not present

## 2023-02-10 ENCOUNTER — Ambulatory Visit (HOSPITAL_COMMUNITY)
Admission: RE | Admit: 2023-02-10 | Discharge: 2023-02-10 | Disposition: A | Discharge status: Home / Self Care | Payer: Medicare PPO | Source: Ambulatory Visit | Attending: Internal Medicine | Admitting: Internal Medicine

## 2023-02-10 ENCOUNTER — Inpatient Hospital Stay: Payer: Medicare PPO | Attending: Internal Medicine

## 2023-02-10 ENCOUNTER — Other Ambulatory Visit: Payer: Self-pay

## 2023-02-10 DIAGNOSIS — L4050 Arthropathic psoriasis, unspecified: Secondary | ICD-10-CM | POA: Diagnosis not present

## 2023-02-10 DIAGNOSIS — Z794 Long term (current) use of insulin: Secondary | ICD-10-CM | POA: Diagnosis not present

## 2023-02-10 DIAGNOSIS — C3490 Malignant neoplasm of unspecified part of unspecified bronchus or lung: Secondary | ICD-10-CM

## 2023-02-10 DIAGNOSIS — E109 Type 1 diabetes mellitus without complications: Secondary | ICD-10-CM | POA: Diagnosis not present

## 2023-02-10 DIAGNOSIS — E876 Hypokalemia: Secondary | ICD-10-CM | POA: Insufficient documentation

## 2023-02-10 DIAGNOSIS — C3491 Malignant neoplasm of unspecified part of right bronchus or lung: Secondary | ICD-10-CM | POA: Insufficient documentation

## 2023-02-10 DIAGNOSIS — Z79899 Other long term (current) drug therapy: Secondary | ICD-10-CM | POA: Diagnosis not present

## 2023-02-10 DIAGNOSIS — J449 Chronic obstructive pulmonary disease, unspecified: Secondary | ICD-10-CM | POA: Insufficient documentation

## 2023-02-10 DIAGNOSIS — R918 Other nonspecific abnormal finding of lung field: Secondary | ICD-10-CM | POA: Diagnosis not present

## 2023-02-10 DIAGNOSIS — Z9221 Personal history of antineoplastic chemotherapy: Secondary | ICD-10-CM | POA: Insufficient documentation

## 2023-02-10 DIAGNOSIS — Z923 Personal history of irradiation: Secondary | ICD-10-CM | POA: Insufficient documentation

## 2023-02-10 DIAGNOSIS — Z885 Allergy status to narcotic agent status: Secondary | ICD-10-CM | POA: Diagnosis not present

## 2023-02-10 LAB — CMP (CANCER CENTER ONLY)
ALT: 12 U/L (ref 0–44)
AST: 16 U/L (ref 15–41)
Albumin: 3.9 g/dL (ref 3.5–5.0)
Alkaline Phosphatase: 112 U/L (ref 38–126)
Anion gap: 7 (ref 5–15)
BUN: 16 mg/dL (ref 8–23)
CO2: 33 mmol/L — ABNORMAL HIGH (ref 22–32)
Calcium: 9.8 mg/dL (ref 8.9–10.3)
Chloride: 101 mmol/L (ref 98–111)
Creatinine: 0.99 mg/dL (ref 0.44–1.00)
GFR, Estimated: 57 mL/min — ABNORMAL LOW (ref >60)
Glucose, Bld: 250 mg/dL — ABNORMAL HIGH (ref 70–99)
Potassium: 3.1 mmol/L — ABNORMAL LOW (ref 3.5–5.1)
Sodium: 141 mmol/L (ref 135–145)
Total Bilirubin: 0.5 mg/dL (ref 0.3–1.2)
Total Protein: 7.1 g/dL (ref 6.5–8.1)

## 2023-02-10 LAB — CBC WITH DIFFERENTIAL (CANCER CENTER ONLY)
Abs Immature Granulocytes: 0.13 10*3/uL — ABNORMAL HIGH (ref 0.00–0.07)
Basophils Absolute: 0.1 10*3/uL (ref 0.0–0.1)
Basophils Relative: 1 %
Eosinophils Absolute: 0.4 10*3/uL (ref 0.0–0.5)
Eosinophils Relative: 5 %
HCT: 41.6 % (ref 36.0–46.0)
Hemoglobin: 13.6 g/dL (ref 12.0–15.0)
Immature Granulocytes: 2 %
Lymphocytes Relative: 19 %
Lymphs Abs: 1.5 10*3/uL (ref 0.7–4.0)
MCH: 28 pg (ref 26.0–34.0)
MCHC: 32.7 g/dL (ref 30.0–36.0)
MCV: 85.6 fL (ref 80.0–100.0)
Monocytes Absolute: 0.5 10*3/uL (ref 0.1–1.0)
Monocytes Relative: 6 %
Neutro Abs: 5.2 10*3/uL (ref 1.7–7.7)
Neutrophils Relative %: 67 %
Platelet Count: 228 10*3/uL (ref 150–400)
RBC: 4.86 MIL/uL (ref 3.87–5.11)
RDW: 15.1 % (ref 11.5–15.5)
WBC Count: 7.8 10*3/uL (ref 4.0–10.5)
nRBC: 0 % (ref 0.0–0.2)

## 2023-02-10 MED ORDER — IOHEXOL 300 MG/ML  SOLN
75.0000 mL | Freq: Once | INTRAMUSCULAR | Status: AC | PRN
  Administered 2023-02-10: 75 mL via INTRAVENOUS

## 2023-02-10 MED ORDER — SODIUM CHLORIDE (PF) 0.9 % IJ SOLN
INTRAMUSCULAR | Status: AC
  Filled 2023-02-10: qty 50

## 2023-02-13 ENCOUNTER — Other Ambulatory Visit: Payer: Medicare PPO

## 2023-02-15 ENCOUNTER — Other Ambulatory Visit: Payer: Self-pay

## 2023-02-15 ENCOUNTER — Inpatient Hospital Stay: Payer: Medicare PPO | Admitting: Internal Medicine

## 2023-02-15 VITALS — BP 129/80 | Temp 98.1°F | Resp 16 | Wt 172.2 lb

## 2023-02-15 DIAGNOSIS — C3490 Malignant neoplasm of unspecified part of unspecified bronchus or lung: Secondary | ICD-10-CM | POA: Diagnosis not present

## 2023-02-15 DIAGNOSIS — E876 Hypokalemia: Secondary | ICD-10-CM | POA: Diagnosis not present

## 2023-02-15 DIAGNOSIS — J449 Chronic obstructive pulmonary disease, unspecified: Secondary | ICD-10-CM | POA: Diagnosis not present

## 2023-02-15 DIAGNOSIS — Z794 Long term (current) use of insulin: Secondary | ICD-10-CM | POA: Diagnosis not present

## 2023-02-15 DIAGNOSIS — Z79899 Other long term (current) drug therapy: Secondary | ICD-10-CM | POA: Diagnosis not present

## 2023-02-15 DIAGNOSIS — E109 Type 1 diabetes mellitus without complications: Secondary | ICD-10-CM | POA: Diagnosis not present

## 2023-02-15 DIAGNOSIS — Z9221 Personal history of antineoplastic chemotherapy: Secondary | ICD-10-CM | POA: Diagnosis not present

## 2023-02-15 DIAGNOSIS — Z923 Personal history of irradiation: Secondary | ICD-10-CM | POA: Diagnosis not present

## 2023-02-15 DIAGNOSIS — L4050 Arthropathic psoriasis, unspecified: Secondary | ICD-10-CM | POA: Diagnosis not present

## 2023-02-15 DIAGNOSIS — C3491 Malignant neoplasm of unspecified part of right bronchus or lung: Secondary | ICD-10-CM | POA: Diagnosis not present

## 2023-02-15 NOTE — Progress Notes (Signed)
[facility] [phone]   [phone]  OFFICE PROGRESS NOTE  Pincus Sanes, MD [address] [place] [address]  DIAGNOSIS: Squamous cell lung cancer   Primary site: Lung (Right)   Staging method: AJCC 7th Edition   Clinical: Stage IIIB (T3, N3, M0)   Summary: Stage IIIB (T3, N3, M0) diagnosed in January 2015.  PRIOR THERAPY:   1) Concurrent chemoradiation with weekly chemotherapy in the form of carboplatin for AUC of 2 and paclitaxel 45 mg/m2. Status post 7 week of treatment. Last dose was given 01/27/2014 no significant response in her disease.   2) Consolidation chemotherapy with carboplatin for AUC of 5 and paclitaxel 175 mg/M2 every 3 weeks with Neulasta support. First dose on 04/15/2014. Status post 3 cycles. Carboplatin was discontinued starting cycle #2 secondary to hypersensitivity reaction. 3) Immunotherapy with Opdivo (Nivolumab) /kg given every 2 weeks. Status post 33 cycles discontinued secondary to intolerance.  CURRENT THERAPY: Observation.  INTERVAL HISTORY: Megan [name] 82 y.o. female returns to the clinic today for follow-up visit.  The patient is feeling fine today with no concerning complaints except for mild shortness of breath with exertion.  She was diagnosed with COPD and she is followed by Dr. Marchelle Gearing but has not seen him for a while.  She denied having any significant chest pain, cough or hemoptysis.  She has no nausea, vomiting, diarrhea or constipation.  She has no headache or visual changes.  She denied having any weight loss or night sweats.  She is here today for evaluation with repeat CT scan of the chest for restaging of her disease.  MEDICAL HISTORY: Past Medical History:  Diagnosis Date   Cancer (HCC) dx'd 09/2013   Lung ca   Concussion 2018   Diabetes (HCC) 09/08/2016   type I diabetic patient reported on 09/13/16    Diabetes mellitus    insulin   Diabetic neuropathy (HCC) 09/08/2016    Fall 2018   GERD (gastroesophageal reflux disease)    no meds for   History of colitis    History of hiatal hernia    Hx of radiation therapy 12/16/13-01/30/14   lung 66Gy   Hypertension    Malignant neoplasm of right upper lobe of lung (HCC) 11/28/2013   Neuropathy    Psoriasis    Psoriatic arthritis (HCC) 09/08/2016   Rheumatoid arthritis (HCC)    Shortness of breath dyspnea    with exertion    ALLERGIES:  is allergic to carboplatin, remicade [infliximab], other, rifampin, and hydromorphone.  MEDICATIONS:  Current Outpatient Medications  Medication Sig Dispense Refill   albuterol (PROVENTIL HFA;VENTOLIN HFA) 108 (90 Base) MCG/ACT inhaler Inhale 2 puffs into the lungs every 6 (six) hours as needed for wheezing or shortness of breath. 1 Inhaler 2   amLODipine (NORVASC) 10 MG tablet TAKE 1 TABLET BY MOUTH EVERY DAY 90 tablet 1   augmented betamethasone dipropionate (DIPROLENE-AF) 0.05 % ointment      B-D UF III MINI PEN NEEDLES 31G X 5 MM MISC USE AS DIRECTED WITH INSULIN 4 TIMES A DAY 100 each 3   betamethasone dipropionate (DIPROLENE) 0.05 % cream Apply topically 2 (two) times daily. (Patient not taking: Reported on 01/06/2023) 45 g 0   budesonide-formoterol (SYMBICORT) 80-4.5 MCG/ACT inhaler Inhale 2 puffs into the lungs 2 (two) times daily. (Patient taking differently: Inhale 2 puffs into the lungs daily as needed (Wheezing).) 1 Inhaler 6   Continuous Blood Gluc Sensor (DEXCOM  G6 SENSOR) MISC Apply new sensor every 10 days for continuous glucose monitoring. 9 each 3   Continuous Blood Gluc Transmit (DEXCOM G6 TRANSMITTER) MISC Use one transmitter every 3 months for continuous glucose monitoring. 1 each 0   DULoxetine (CYMBALTA) 60 MG capsule TAKE 1 CAPSULE BY MOUTH TWICE A DAY 180 capsule 1   Fluocinolone Acetonide Scalp 0.01 % OIL      fluocinonide (LIDEX) 0.05 % external solution  (Patient not taking: Reported on 01/06/2023)     GVOKE HYPOPEN 1-PACK 1 MG/0.2ML SOAJ Inject under skin 0.2  ml as needed for hypoglycemia (Patient not taking: Reported on 01/06/2023) 0.2 mL prn   insulin aspart (NOVOLOG FLEXPEN) 100 UNIT/ML FlexPen INJECT 18-22 UNITS UNDER THE SKIN THREE TIMES DAILY 45 mL 3   insulin degludec (TRESIBA FLEXTOUCH) 100 UNIT/ML FlexTouch Pen Inject up to 36-38 units into the skin daily 30 mL 3   ketoconazole (NIZORAL) 2 % cream      OTEZLA 30 MG TABS      pravastatin (PRAVACHOL) 40 MG tablet TAKE 1 TABLET(40 MG) BY MOUTH EVERY EVENING 90 tablet 1   No current facility-administered medications for this visit.    SURGICAL HISTORY:  Past Surgical History:  Procedure Laterality Date   ABDOMINAL HYSTERECTOMY     ABDOMINAL SURGERY     BALLOON DILATION N/A 12/12/2014   Procedure: BALLOON DILATION;  Surgeon: Louis Meckel, MD;  Location: [facility] END[facility]COPY;  Service: Endoscopy;  Laterality: N/A;   BIOPSY  01/20/2020   Procedure: BIOPSY;  Surgeon: Sherrilyn Rist, MD;  Location: [facility];  Service: Gastroenterology;;   CHOLECYSTECTOMY     COLONOSCOPY N/A 02/07/2016   Procedure: COLONOSCOPY;  Surgeon: Sherrilyn Rist, MD;  Location[facility] END[facility]COPY;  Service: Endoscopy;  Laterality: N/A;   DILATION AND CURETTAGE OF UTERUS     ESOPHAGOGASTRODUODENOSCOPY N/A 12/12/2014   Procedure: ESOPHAGOGASTRODUODENOSCOPY (EGD);  Surgeon: Louis Meckel, MD;  Location: [facility] ENDOSCOPY;  Service: Endoscopy;  Laterality: N/A;  with dilation   ESOPHAGOGASTRODUODENOSCOPY (EGD) WITH PROPOFOL N/A 10/09/2014   Procedure: ESOPHAGOGASTRODUODENOSCOPY (EGD) WITH PROPOFOL;  Surgeon: Louis Meckel, MD;  Location[facility]COPY;  Service: Endoscopy;  Laterality: N/A;   ESOPHAGOGASTRODUODENOSCOPY (EGD) WITH PROPOFOL N/A 09/19/2016   Procedure: ESOPHAGOGASTRODUODENOSCOPY (EGD) WITH PROPOFOL;  Surgeon: Sherrilyn Rist, MD;  Location: [facility] ENDOSCOPY;  Service: Gastroenterology;  Laterality: N/A;  with savary dil tt   ESOPHAGOGASTRODUODENOSCOPY (EGD) WITH PROPOFOL N/A 01/20/2020   Procedure:  ESOPHAGOGASTRODUODENOSCOPY (EGD) WITH PROPOFOL;  Surgeon: Sherrilyn Rist, MD;  Location: [facility] ENDOSCOPY;  Service: Gastroenterology;  Laterality: N/A;  Fluoro-Savary   ORIF ANKLE FRACTURE Right 07/04/2015   Procedure: OPEN REDUCTION INTERNAL FIXATION (ORIF) ANKLE FRACTURE;  Surgeon: Nadara Mustard, MD;  Location: [facility] OR;  Service: Orthopedics;  Laterality: Right;  OPEN REDUCTION, INTERNAL FIXATION OF RIGHT ANKLE FRACTURE.    ORIF ANKLE FRACTURE Left 02/10/2016   Procedure: OPEN REDUCTION INTERNAL FIXATION (ORIF) ANKLE FRACTURE;  Surgeon: Nadara Mustard, MD;  Location: [facility] OR;  Service: Orthopedics;  Laterality: Left;   SAVORY DILATION N/A 10/09/2014   Procedure: SAVORY DILATION;  Surgeon: Louis Meckel, MD;  Location: [facility] ENDOSCOPY;  Service: Endoscopy;  Laterality: N/A;   SAVORY DILATION N/A 09/19/2016   Procedure: SAVORY DILATION;  Surgeon: Sherrilyn Rist, MD;  Location: [facility]OS[facility];  Service: Gastroenterology;  Laterality: N/A;   SAVORY DILATION N/A 01/20/2020   Procedure: SAVORY DILATION;  Surgeon: Sherrilyn Rist, MD;  Location: [facility]  ENDOSCOPY;  Service: Gastroenterology;  Laterality: N/A;   TONSILLECTOMY      REVIEW OF SYSTEMS:  A comprehensive review of systems was negative except for: Respiratory: positive for dyspnea on exertion   PHYSICAL EXAMINATION: General appearance: alert, cooperative and no distress Head: Normocephalic, without obvious abnormality, atraumatic Neck: no adenopathy, no JVD, supple, symmetrical, trachea midline and thyroid not enlarged, symmetric, no tenderness/mass/nodules Lymph nodes: Cervical, supraclavicular, and axillary nodes normal. Resp: clear to auscultation bilaterally Back: symmetric, no curvature. ROM normal. No CVA tenderness. Cardio: regular rate and rhythm, S1, S2 normal, no murmur, click, rub or gallop GI: soft, non-tender; bowel sounds normal; no masses,  no organomegaly Extremities: extremities normal, atraumatic, no cyanosis or edema    ECOG  PERFORMANCE STATUS: 1 - Symptomatic but completely ambulatory  Blood pressure 129/80, temperature 98.1 F (36.7 C), temperature source Oral, resp. rate 16, weight 172 lb 3.2 oz (78.1 kg), SpO2 96 %.  LABORATORY DATA: Lab Results  Component Value Date   WBC 7.8 02/10/2023   HGB 13.6 02/10/2023   HCT 41.6 02/10/2023   MCV 85.6 02/10/2023   PLT 228 02/10/2023      Chemistry      Component Value Date/Time   NA 141 02/10/2023 1330   NA 139 07/10/2019 0922   NA 141 09/08/2017 1213   K 3.1 (L) 02/10/2023 1330   K 4.1 09/08/2017 1213   CL 101 02/10/2023 1330   CO2 33 (H) 02/10/2023 1330   CO2 28 09/08/2017 1213   BUN 16 02/10/2023 1330   BUN 18 07/10/2019 0922   BUN 13.8 09/08/2017 1213   CREATININE 0.99 02/10/2023 1330   CREATININE 0.9 09/08/2017 1213   GLU 176 03/07/2016 0000      Component Value Date/Time   CALCIUM 9.8 02/10/2023 1330   CALCIUM 9.4 09/08/2017 1213   ALKPHOS 112 02/10/2023 1330   ALKPHOS 182 (H) 09/08/2017 1213   AST 16 02/10/2023 1330   AST 15 09/08/2017 1213   ALT 12 02/10/2023 1330   ALT 17 09/08/2017 1213   BILITOT 0.5 02/10/2023 1330   BILITOT 0.45 09/08/2017 1213       RADIOGRAPHIC STUDIES: CT Chest W Contrast  Result Date: 02/14/2023 CLINICAL DATA:  Non-small cell lung cancer. Initial diagnosis in 2015. * Tracking Code: BO * EXAM: CT CHEST WITH CONTRAST TECHNIQUE: Multidetector CT imaging of the chest was performed during intravenous contrast administration. RADIATION DOSE REDUCTION: This exam was performed according to the departmental dose-optimization program which includes automated exposure control, adjustment of the mA and/or kV according to patient size and/or use of iterative reconstruction technique. CONTRAST:  75mL OMNIPAQUE IOHEXOL 300 MG/ML  SOLN COMPARISON:  Multiple prior imaging studies. The most recent chest CT is 02/11/2022 FINDINGS: Cardiovascular: The heart is normal in size. Stable scattered pericardial fluid without overt  effusion. The aorta is within normal limits in caliber. No dissection. Stable scattered atherosclerotic calcifications. Stable coronary artery calcifications. Mediastinum/Nodes: Stable soft tissue density in the subcarinal region possibly a combination of thickening of the esophagus and treated tumor/radiation changes. No new or progressive findings in this area. No discrete enlarged mediastinal or hilar lymph nodes. Stable small scattered nodes. Lungs/Pleura: Stable surgical changes from right upper lobe lobectomy and right middle lobe collapse. No findings suspicious for recurrent tumor. No new pulmonary lesions or pulmonary nodules to suggest metastatic disease. Stable left lower lobe calcified granuloma. No pleural effusions or pleural nodules. Upper Abdomen: No significant upper abdominal findings. Stable left hepatic lobe cyst. No  adrenal gland lesions. No upper abdominal adenopathy. Stable vascular calcifications and stable calcified granulomas in the spleen. Musculoskeletal: No worrisome bone lesions. IMPRESSION: 1. Stable surgical changes from right upper lobe lobectomy and right middle lobe collapse. No findings suspicious for recurrent tumor. 2. Stable soft tissue density in the subcarinal region possibly a combination of thickening of the esophagus and treated tumor/radiation changes. No new or progressive findings in this area. 3. No new pulmonary lesions or pulmonary nodules to suggest metastatic disease. Electronically Signed   By: Rudie Meyer M.D.   On: 02/14/2023 09:15     ASSESSMENT AND PLAN:  This is a very pleasant 82 years old white female with metastatic non-small cell lung cancer status post a course of concurrent chemoradiation as well as consolidation chemotherapy discontinued secondary to intolerance and significant neuropathy. The patient was treated with immunotherapy with Nivolumab status post 33 cycles and tolerated the treatment well but it was discontinued secondary to  chemotherapy-induced colitis and diarrhea. The patient has been on observation now for several years and she is feeling fine with no concerning complaints. She had repeat CT scan of the chest performed recently.  I personally and independently reviewed the scan and discussed the result with the patient today. Her scan showed no concerning findings for disease progression. I recommended for her to continue on observation with repeat CT scan of the chest in 1 year. For the hypokalemia, she was advised to increase her potassium rich diet. The patient was advised to call immediately if she has any other concerning symptoms in the interval. The patient voices understanding of current disease status and treatment options and is in agreement with the current care plan. All questions were answered. The patient knows to call the clinic with any problems, questions or concerns. We can certainly see the patient much sooner if necessary.  Disclaimer: This note was dictated with voice recognition software. Similar sounding words can inadvertently be transcribed and may not be corrected upon review.

## 2023-02-22 DIAGNOSIS — E1140 Type 2 diabetes mellitus with diabetic neuropathy, unspecified: Secondary | ICD-10-CM | POA: Diagnosis not present

## 2023-03-01 DIAGNOSIS — E139 Other specified diabetes mellitus without complications: Secondary | ICD-10-CM | POA: Diagnosis not present

## 2023-03-01 DIAGNOSIS — E782 Mixed hyperlipidemia: Secondary | ICD-10-CM | POA: Diagnosis not present

## 2023-03-01 DIAGNOSIS — I10 Essential (primary) hypertension: Secondary | ICD-10-CM | POA: Diagnosis not present

## 2023-03-04 DIAGNOSIS — E139 Other specified diabetes mellitus without complications: Secondary | ICD-10-CM | POA: Diagnosis not present

## 2023-03-21 ENCOUNTER — Other Ambulatory Visit: Payer: Self-pay | Admitting: Neurology

## 2023-04-04 DIAGNOSIS — E139 Other specified diabetes mellitus without complications: Secondary | ICD-10-CM | POA: Diagnosis not present

## 2023-04-06 ENCOUNTER — Other Ambulatory Visit: Payer: Self-pay

## 2023-04-06 ENCOUNTER — Ambulatory Visit: Payer: Medicare PPO | Admitting: Family Medicine

## 2023-04-06 ENCOUNTER — Telehealth: Payer: Self-pay

## 2023-04-06 VITALS — BP 104/74 | HR 104 | Ht 65.0 in | Wt 172.0 lb

## 2023-04-06 DIAGNOSIS — M1712 Unilateral primary osteoarthritis, left knee: Secondary | ICD-10-CM

## 2023-04-06 DIAGNOSIS — M25562 Pain in left knee: Secondary | ICD-10-CM

## 2023-04-06 DIAGNOSIS — G8929 Other chronic pain: Secondary | ICD-10-CM

## 2023-04-06 NOTE — Telephone Encounter (Signed)
ORTHOVISC for LEFT knee OA  APPROVED - not required PA# [id number] Valid: 04/06/23-10/31/23

## 2023-04-06 NOTE — Telephone Encounter (Signed)
Prior Authorization initiated for ORTHOVISC via [email or web address] KEY: [id number]

## 2023-04-06 NOTE — Patient Instructions (Signed)
Thank you for coming in today.   You received an injection today. Seek immediate medical attention if the joint becomes red, extremely painful, or is oozing fluid.   Please use Voltaren gel (Generic Diclofenac Gel) up to 4x daily for pain as needed.  This is available over-the-counter as both the name brand Voltaren gel and the generic diclofenac gel.

## 2023-04-06 NOTE — Progress Notes (Signed)
Megan Payor, PhD, LAT, ATC acting as a scribe for Clementeen Graham, MD.  Megan [name] is a 82 y.o. female who presents to [facility] today for exacerbation of her L knee pain. Pt was last seen by Dr. Denyse Amass on 01/26/22 and was advised we would work to authorize gel shots. Insurance authorization was obtained on 01/27/22, but pt never called back to proceed.   Today, pt reports she was uncertain about the gel shot. L knee pain worsened over the last 2 month. No swelling. She thinks the steroid injection worked pretty well for longer than 6 months.   Dx imaging: 12/29/21 L knee  07/22/14 L knee XR  Pertinent review of systems: no fever or chills  Relevant historical information: history of lung cancer Diabetes  Exam:  BP 104/74   Pulse (!) 104   Ht 5\' 5"  (1.651 m)   Wt 172 lb (78 kg)   SpO2 97%   BMI 28.62 kg/m  General: Well Developed, well nourished, and in no acute distress.   MSK: Left knee mild effusion normal-appearing otherwise.  Normal motion.  Tender palpation medial joint line.    Lab and Radiology Results  Procedure: Real-time Ultrasound Guided Injection of left knee joint superior lateral patellar space Device: Philips Affiniti 50G Images permanently stored and available for review in PACS Verbal informed consent obtained.  Discussed risks and benefits of procedure. Warned about infection, bleeding, hyperglycemia damage to structures among others. Patient expresses understanding and agreement Time-out conducted.   Noted no overlying erythema, induration, or other signs of local infection.   Skin prepped in a sterile fashion.   Local anesthesia: Topical Ethyl chloride.   With sterile technique and under real time ultrasound guidance: 40 mg of Kenalog and 2 mL Marcaine injected into knee joint. Fluid seen entering the joint capsule.   Completed without difficulty   Pain immediately resolved suggesting accurate placement of the  medication.   Advised to call if fevers/chills, erythema, induration, drainage, or persistent bleeding.   Images permanently stored and available for review in the ultrasound unit.  Impression: Technically successful ultrasound guided injection.    EXAM: LEFT KNEE 3 VIEWS   COMPARISON:  None.   FINDINGS: Normal alignment. No acute fracture or dislocation. Mild bicompartmental degenerative arthritis, most severe within the medial compartment. Lateral compartment joint space appears preserved. Trace left knee effusion. Soft tissues are unremarkable.   IMPRESSION: Mild bicompartmental degenerative arthritis.  Trace effusion.     Electronically Signed   By: Helyn Numbers M.D.   On: 12/31/2021 00:20 I, Clementeen Graham, personally (independently) visualized and performed the interpretation of the images attached in this note.      Assessment and Plan: 82 y.o. female with left knee pain due to exacerbation of DJD.  Plan for repeat steroid injection today.  We started authorization for hyaluronic acid injections before she even came in and they are approved now.  However they do not make sense to do right now since steroid shots worked pretty well.  If this shot does not last 3 months we certainly could do gel shots if needed.[name]   PDMP not reviewed this encounter. Orders Placed This Encounter  Procedures   [place] LIMITED JOINT SPACE STRUCTURES LOW LEFT(NO LINKED CHARGES)    Order Specific Question:   Reason for Exam (SYMPTOM  OR DIAGNOSIS REQUIRED)    Answer:   left knee pain    Order Specific Question:   Preferred imaging  location?    Answer:   [facility]   No orders of the defined types were placed in this encounter.    Discussed warning signs or symptoms. Please see discharge instructions. Patient expresses understanding.   The above documentation has been reviewed and is accurate and complete Clementeen Graham, M.D.

## 2023-04-07 ENCOUNTER — Other Ambulatory Visit: Payer: Self-pay | Admitting: Neurology

## 2023-04-07 ENCOUNTER — Other Ambulatory Visit: Payer: Self-pay | Admitting: Internal Medicine

## 2023-04-07 NOTE — Telephone Encounter (Signed)
Benefits still pending - per [employer] portal, working through issues with payer.

## 2023-04-10 DIAGNOSIS — M1712 Unilateral primary osteoarthritis, left knee: Secondary | ICD-10-CM | POA: Insufficient documentation

## 2023-04-10 NOTE — Telephone Encounter (Addendum)
Orthovisc for LEFT knee OA  Primary Insurance: [employer] [facility] Co-Pay: $40 Co-insurance: n/a Deductible: does not apply Prior Auth NOT required   LEFT knee inj history 12/29/21 - Kenalog 04/06/23 - Kenalog

## 2023-04-12 NOTE — Telephone Encounter (Signed)
Holding if needed by patient.

## 2023-04-28 DIAGNOSIS — E1140 Type 2 diabetes mellitus with diabetic neuropathy, unspecified: Secondary | ICD-10-CM | POA: Diagnosis not present

## 2023-05-23 ENCOUNTER — Telehealth: Payer: Self-pay | Admitting: Internal Medicine

## 2023-05-23 NOTE — Telephone Encounter (Signed)
Patient called to request a referral and was informed she would need an appointment since she was last seen 07/15/2022. She said she would like a referral to a dermatologist within [place]. Patient has an OV scheduled for 06/14/2023. She said she would still like for a nurse to call her back. Best callback is [phone].

## 2023-05-24 NOTE — Telephone Encounter (Signed)
Tried to call pt it states the # you called is not excepting your call then hang up. Tried calling again received the same msg../lmb

## 2023-05-30 ENCOUNTER — Ambulatory Visit (INDEPENDENT_AMBULATORY_CARE_PROVIDER_SITE_OTHER): Payer: Medicare PPO

## 2023-05-30 VITALS — Ht 65.5 in

## 2023-05-30 DIAGNOSIS — Z0000 Encounter for general adult medical examination without abnormal findings: Secondary | ICD-10-CM

## 2023-05-30 NOTE — Progress Notes (Signed)
Subjective:   Megan [name] is a 82 y.o. female who presents for Medicare Annual (Subsequent) preventive examination.  Visit Complete: Virtual  I connected with  Megan [name] on 05/30/23 by a audio enabled telemedicine application and verified that I am speaking with the correct person using two identifiers.  Patient Location: Home  Provider Location: Home Office  I discussed the limitations of evaluation and management by telemedicine. The patient expressed understanding and agreed to proceed.  Vital Signs: Per patient no change in vitals since last visit.  Review of Systems    Cardiac Risk Factors include: advanced age (>62men, >89 women);diabetes mellitus;dyslipidemia;hypertension;Other (see comment), Risk factor comments: Neuropathy     Objective:    Today's Vitals   05/30/23 1317  Height: 5' 5.5" (1.664 m)   Body mass index is 28.19 kg/m.     05/30/2023    1:20 PM 05/04/2022    1:49 PM 02/15/2022    3:24 PM 01/20/2020    9:52 AM 10/11/2018    4:00 PM 08/01/2018    1:59 PM 03/19/2018   11:49 AM  Advanced Directives  Does Patient Have a Medical Advance Directive? Yes Yes No Yes No Yes Yes  Type of Estate agent of [place];Living will Healthcare Power of [employer] of [place];Living will  Healthcare Power of [place];Living will Healthcare Power of [place];Living will  Does patient want to make changes to medical advance directive? No - Patient declined No - Patient declined       Copy of Healthcare Power of Attorney in Chart? Yes - validated most recent copy scanned in chart (See row information) Yes - validated most recent copy scanned in chart (See row information)    No - copy requested No - copy requested  Would patient like information on creating a medical advance directive?   No - Patient declined  No - Patient declined      Current Medications (verified) Outpatient Encounter Medications as of 05/30/2023   Medication Sig   albuterol (PROVENTIL HFA;VENTOLIN HFA) 108 (90 Base) MCG/ACT inhaler Inhale 2 puffs into the lungs every 6 (six) hours as needed for wheezing or shortness of breath.   amLODipine (NORVASC) 10 MG tablet TAKE 1 TABLET BY MOUTH EVERY DAY   augmented betamethasone dipropionate (DIPROLENE-AF) 0.05 % ointment    B-D UF III MINI PEN NEEDLES 31G X 5 MM MISC USE AS DIRECTED WITH INSULIN 4 TIMES A DAY   betamethasone dipropionate (DIPROLENE) 0.05 % cream Apply topically 2 (two) times daily.   budesonide-formoterol (SYMBICORT) 80-4.5 MCG/ACT inhaler Inhale 2 puffs into the lungs 2 (two) times daily. (Patient taking differently: Inhale 2 puffs into the lungs daily as needed (Wheezing).)   Continuous Blood Gluc Sensor (DEXCOM G6 SENSOR) MISC Apply new sensor every 10 days for continuous glucose monitoring.   Continuous Blood Gluc Transmit (DEXCOM G6 TRANSMITTER) MISC Use one transmitter every 3 months for continuous glucose monitoring.   DULoxetine (CYMBALTA) 60 MG capsule TAKE 1 CAPSULE BY MOUTH TWICE A DAY   Fluocinolone Acetonide Scalp 0.01 % OIL    fluocinonide (LIDEX) 0.05 % external solution    GVOKE HYPOPEN 1-PACK 1 MG/0.2ML SOAJ Inject under skin 0.2 ml as needed for hypoglycemia   insulin aspart (NOVOLOG FLEXPEN) 100 UNIT/ML FlexPen INJECT 18-22 UNITS UNDER THE SKIN THREE TIMES DAILY   insulin degludec (TRESIBA FLEXTOUCH) 100 UNIT/ML FlexTouch Pen Inject up to 36-38 units into the skin daily   ketoconazole (NIZORAL) 2 % cream  OTEZLA 30 MG TABS    pravastatin (PRAVACHOL) 40 MG tablet TAKE 1 TABLET(40 MG) BY MOUTH EVERY EVENING   No facility-administered encounter medications on file as of 05/30/2023.    Allergies (verified) Carboplatin, Remicade [infliximab], Other, Rifampin, and Hydromorphone   History: Past Medical History:  Diagnosis Date   Cancer (HCC) dx'd 09/2013   Lung ca   Concussion 2018   Diabetes (HCC) 09/08/2016   type I diabetic patient reported on 09/13/16     Diabetes mellitus    insulin   Diabetic neuropathy (HCC) 09/08/2016   Fall 2018   GERD (gastroesophageal reflux disease)    no meds for   History of colitis    History of hiatal hernia    Hx of radiation therapy 12/16/13-01/30/14   lung 66Gy   Hypertension    Malignant neoplasm of right upper lobe of lung (HCC) 11/28/2013   Neuropathy    Psoriasis    Psoriatic arthritis (HCC) 09/08/2016   Rheumatoid arthritis (HCC)    Shortness of breath dyspnea    with exertion   Past Surgical History:  Procedure Laterality Date   ABDOMINAL HYSTERECTOMY     ABDOMINAL SURGERY     BALLOON DILATION N/A 12/12/2014   Procedure: BALLOON DILATION;  Surgeon: Louis Meckel, MD;  Location: [facility] ENDOSCOPY;  Service: Endoscopy;  Laterality: N/A;   BIOPSY  01/20/2020   Procedure: BIOPSY;  Surgeon: Sherrilyn Rist, MD;  Location: [facility] ENDOSCOPY;  Service: Gastroenterology;;   CHOLECYSTECTOMY     COLONOSCOPY N/A 02/07/2016   Procedure: COLONOSCOPY;  Surgeon: Sherrilyn Rist, MD;  Location[facility] ENDOSCOPY;  Service: Endoscopy;  Laterality: N/A;   DILATION AND CURETTAGE OF UTERUS     ESOPHAGOGASTRODUODENOSCOPY N/A 12/12/2014   Procedure: ESOPHAGOGASTRODUODENOSCOPY (EGD);  Surgeon: Louis Meckel, MD;  Location: [facility] ENDOSCOPY;  Service: Endoscopy;  Laterality: N/A;  with dilation   ESOPHAGOGASTRODUODENOSCOPY (EGD) WITH PROPOFOL N/A 10/09/2014   Procedure: ESOPHAGOGASTRODUODENOSCOPY (EGD) WITH PROPOFOL;  Surgeon: Louis Meckel, MD;  Location: [facility] ENDOSCOPY;  Service: Endoscopy;  Laterality: N/A;   ESOPHAGOGASTRODUODENOSCOPY (EGD) WITH PROPOFOL N/A 09/19/2016   Procedure: ESOPHAGOGASTRODUODENOSCOPY (EGD) WITH PROPOFOL;  Surgeon: Sherrilyn Rist, MD;  Location: [facility] ENDOSCOPY;  Service: Gastroenterology;  Laterality: N/A;  with savary dil tt   ESOPHAGOGASTRODUODENOSCOPY (EGD) WITH PROPOFOL N/A 01/20/2020   Procedure: ESOPHAGOGASTRODUODENOSCOPY (EGD) WITH PROPOFOL;  Surgeon: Sherrilyn Rist, MD;  Location: [facility]  ENDOSCOPY;  Service: Gastroenterology;  Laterality: N/A;  Fluoro-Savary   ORIF ANKLE FRACTURE Right 07/04/2015   Procedure: OPEN REDUCTION INTERNAL FIXATION (ORIF) ANKLE FRACTURE;  Surgeon: Nadara Mustard, MD;  Location: [facility] OR;  Service: Orthopedics;  Laterality: Right;  OPEN REDUCTION, INTERNAL FIXATION OF RIGHT ANKLE FRACTURE.    ORIF ANKLE FRACTURE Left 02/10/2016   Procedure: OPEN REDUCTION INTERNAL FIXATION (ORIF) ANKLE FRACTURE;  Surgeon: Nadara Mustard, MD;  Location: [facility] OR;  Service: Orthopedics;  Laterality: Left;   SAVORY DILATION N/A 10/09/2014   Procedure: SAVORY DILATION;  Surgeon: Louis Meckel, MD;  Location: [facility] ENDOSCOPY;  Service: Endoscopy;  Laterality: N/A;   SAVORY DILATION N/A 09/19/2016   Procedure: SAVORY DILATION;  Surgeon: Sherrilyn Rist, MD;  Location: [facility] ENDOSCOPY;  Service: Gastroenterology;  Laterality: N/A;   SAVORY DILATION N/A 01/20/2020   Procedure: SAVORY DILATION;  Surgeon: Sherrilyn Rist, MD;  Location: [facility] ENDOSCOPY;  Service: Gastroenterology;  Laterality: N/A;   TONSILLECTOMY     Family History  Problem Relation Age of Onset  Other Mother    Anuerysm Mother        brain   Other Father        failure to thrive   Hypertension Other    Stroke Other    Heart attack Other    Hemachromatosis Other        great grandfather   Rheum arthritis Other        both sets of grandparents and father   Migraines Neg Hx    Colon cancer Neg Hx    Colon polyps Neg Hx    Esophageal cancer Neg Hx    Pancreatic cancer Neg Hx    Stomach cancer Neg Hx    Liver disease Neg Hx    Social History   Socioeconomic History   Marital status: Divorced    Spouse name: Not on file   Number of children: 0   Years of education: MA   Highest education level: Master's degree (e.g., MA, MS, MEng, MEd, MSW, MBA)  Occupational History   Occupation: Retired    Associate Professor: OTHER  Tobacco Use   Smoking status: Former    Current packs/day: 0.00    Average packs/day: 3.0  packs/day for 45.0 years (135.0 ttl pk-yrs)    Types: Cigarettes    Start date: 12/05/1952    Quit date: 12/05/1997    Years since quitting: 25.4   Smokeless tobacco: Never  Vaping Use   Vaping status: Never Used  Substance and Sexual Activity   Alcohol use: No    Alcohol/week: 0.0 standard drinks of alcohol    Comment: rarely    Drug use: No   Sexual activity: Not Currently  Other Topics Concern   Not on file  Social History Narrative   Patient lives at home. She has an old friend who is her caregiver, Madelaine Bhat, who stays with her 24/7.   Caffeine Use: 1 cups daily   Right handed   Social Determinants of Health   Financial Resource Strain: Low Risk  (05/30/2023)   Overall Financial Resource Strain (CARDIA)    Difficulty of Paying Living Expenses: Not hard at all  Food Insecurity: No Food Insecurity (05/30/2023)   Hunger Vital Sign    Worried About Running Out of Food in the Last Year: Never true    Ran Out of Food in the Last Year: Never true  Transportation Needs: No Transportation Needs (05/30/2023)   PRAPARE - Administrator, Civil Service (Medical): No    Lack of Transportation (Non-Medical): No  Physical Activity: Inactive (05/30/2023)   Exercise Vital Sign    Days of Exercise per Week: 0 days    Minutes of Exercise per Session: 0 min  Stress: No Stress Concern Present (05/30/2023)   [employer] of Occupational Health - Occupational Stress Questionnaire    Feeling of Stress : Not at all  Social Connections: Moderately Integrated (05/30/2023)   Social Connection and Isolation Panel [NHANES]    Frequency of Communication with Friends and Family: Once a week    Frequency of Social Gatherings with Friends and Family: More than three times a week    Attends Religious Services: 1 to 4 times per year    Active Member of [employer] or Organizations: Yes    Attends Banker Meetings: 1 to 4 times per year    Marital Status: Divorced    Tobacco  Counseling Counseling given: Not Answered   Clinical Intake:  Pre-visit preparation completed: Yes  Pain : No/denies  pain     Nutritional Risks: None  How often do you need to have someone help you when you read instructions, pamphlets, or other written materials from your doctor or pharmacy?: 1 - Never  Interpreter Needed?: No  Information entered by :: Michai Dieppa, RMA   Activities of Daily Living    05/30/2023    2:16 PM  In your present state of health, do you have any difficulty performing the following activities:  Hearing? 0  Difficulty concentrating or making decisions? 0  Walking or climbing stairs? 0  Dressing or bathing? 0  Doing errands, shopping? 0  Preparing Food and eating ? N  Using the Toilet? N  In the past six months, have you accidently leaked urine? N  Do you have problems with loss of bowel control? N  Managing your Medications? N  Managing your Finances? N  Housekeeping or managing your Housekeeping? N    Patient Care Team: Pincus Sanes, MD as PCP - General (Internal Medicine) Si Gaul, MD as Consulting Physician (Oncology) Maris Berger, MD as Consulting Physician (Ophthalmology)  Indicate any recent Medical Services you may have received from other than [facility] providers in the past year (date may be approximate).     Assessment:   This is a routine wellness examination for [place].  Hearing/Vision screen Hearing Screening - Comments:: Per patient- has hearing aides but does not wear them Vision Screening - Comments:: Does not were eyeglasses by choice  Dietary issues and exercise activities discussed:     Goals Addressed               This Visit's Progress     Patient declined health goal at this time. (pt-stated)   On track     Depression Screen    05/30/2023    1:26 PM 07/15/2022    8:31 AM 05/04/2022    1:42 PM 08/04/2021    3:30 PM 10/09/2020    9:55 AM 03/19/2018   11:50 AM 09/29/2017    3:59 PM  PHQ  2/9 Scores  PHQ - 2 Score 0 0 0 0 0 2 0  PHQ- 9 Score 4     4     Fall Risk    05/30/2023    1:21 PM 07/15/2022    8:31 AM 06/03/2022    9:40 AM 05/04/2022    1:39 PM 01/24/2022   10:00 AM  Fall Risk   Falls in the past year? 0 0 0 0 0  Comment   continues to deny falls x last 12 months; not currently using assistive devices    Number falls in past yr: 0 0 0 0 0  Injury with Fall? 0 0 0 0 0  Comment   N/A- no falls reported  N/A- no falls reported  Risk for fall due to : No Fall Risks No Fall Risks Medication side effect;Impaired balance/gait No Fall Risks Orthopedic patient;Impaired balance/gait  Follow up Falls prevention discussed Falls evaluation completed Falls prevention discussed Falls evaluation completed Falls prevention discussed    MEDICARE RISK AT HOME:  Medicare Risk at Home - 05/30/23 1321     Any stairs in or around the home? No    Home free of loose throw rugs in walkways, pet beds, electrical cords, etc? Yes    Adequate lighting in your home to reduce risk of falls? Yes    Life alert? No    Use of a cane, walker or w/c? No    Grab  bars in the bathroom? Yes    Shower chair or bench in shower? Yes    Elevated toilet seat or a handicapped toilet? No             TIMED UP AND GO:  Was the test performed?  No    Cognitive Function:    03/19/2018   11:54 AM  MMSE - Mini Mental State Exam  Orientation to time 5  Orientation to Place 5  Registration 3  Attention/ Calculation 5  Recall 1  Language- name 2 objects 2  Language- repeat 1  Language- follow 3 step command 3  Language- read & follow direction 1  Write a sentence 1  Copy design 1  Total score 28        05/30/2023    1:22 PM 05/04/2022    1:52 PM  6CIT Screen  What Year? 0 points 0 points  What month? 0 points 0 points  What time? 0 points 0 points  Count back from 20 0 points 0 points  Months in reverse 0 points 0 points  Repeat phrase 0 points 0 points  Total Score 0 points 0 points     Immunizations Immunization History  Administered Date(s) Administered   Fluad Quad(high Dose 65+) 07/31/2019, 10/01/2020, 07/15/2022   Influenza, High Dose Seasonal PF 08/12/2016, 08/30/2017, 09/07/2018, 08/24/2021   Influenza,inj,Quad PF,6+ Mos 11/07/2013, 07/14/2014, 08/25/2015   Influenza-Unspecified 08/07/2018, 09/07/2018   PFIZER(Purple Top)SARS-COV-2 Vaccination 11/28/2019, 12/25/2019, 06/18/2020   PPD Test 03/02/2016   Pfizer Covid-19 Vaccine Bivalent Booster 73yrs & up 07/27/2021   Pneumococcal Conjugate-13 05/10/2019   Pneumococcal Polysaccharide-23 03/19/2018   Pneumococcal-Unspecified 12/02/2015   Tdap 01/11/2017   Zoster Recombinant(Shingrix) 02/14/2018, 08/07/2018    TDAP status: Up to date  Flu Vaccine status: Up to date  Pneumococcal vaccine status: Up to date  Covid-19 vaccine status: Completed vaccines  Qualifies for Shingles Vaccine? Yes   Zostavax completed No   Shingrix Completed?: Yes  Screening Tests Health Maintenance  Topic Date Due   Diabetic kidney evaluation - Urine ACR  10/09/2021   COVID-19 Vaccine (5 - 2023-24 season) 07/01/2022   HEMOGLOBIN A1C  11/24/2022   FOOT EXAM  01/25/2023   INFLUENZA VACCINE  06/01/2023   OPHTHALMOLOGY EXAM  10/20/2023   Diabetic kidney evaluation - eGFR measurement  02/10/2024   Medicare Annual Wellness (AWV)  05/29/2024   DTaP/Tdap/Td (2 - Td or Tdap) 01/12/2027   Pneumonia Vaccine 69+ Years old  Completed   Zoster Vaccines- Shingrix  Completed   HPV VACCINES  Aged Out   DEXA SCAN  Discontinued   Hepatitis C Screening  Discontinued    Health Maintenance  Health Maintenance Due  Topic Date Due   Diabetic kidney evaluation - Urine ACR  10/09/2021   COVID-19 Vaccine (5 - 2023-24 season) 07/01/2022   HEMOGLOBIN A1C  11/24/2022   FOOT EXAM  01/25/2023    Colorectal cancer screening: No longer required.   Mammogram status: No longer required due to age.  Bone Density: Never done/Patient  declined  Lung Cancer Screening: (Low Dose CT Chest recommended if Age 23-80 years, 20 pack-year currently smoking OR have quit w/in 15years.) does not qualify.   Lung Cancer Screening Referral: N/A  Additional Screening:  Hepatitis C Screening: does not qualify;  Vision Screening: Recommended annual ophthalmology exams for early detection of glaucoma and other disorders of the eye. Is the patient up to date with their annual eye exam?  Yes  Who is the provider or  what is the name of the office in which the patient attends annual eye exams? Dr. Charlotte Sanes If pt is not established with a provider, would they like to be referred to a provider to establish care? No .   Dental Screening: Recommended annual dental exams for proper oral hygiene  Diabetic Foot Exam: Diabetic Foot Exam: Completed 01/24/2022  Community Resource Referral / Chronic Care Management: CRR required this visit?  No   CCM required this visit?  No     Plan:     I have personally reviewed and noted the following in the patient's chart:   Medical and social history Use of alcohol, tobacco or illicit drugs  Current medications and supplements including opioid prescriptions. Patient is not currently taking opioid prescriptions. Functional ability and status Nutritional status Physical activity Advanced directives List of other physicians Hospitalizations, surgeries, and ER visits in previous 12 months Vitals Screenings to include cognitive, depression, and falls Referrals and appointments  In addition, I have reviewed and discussed with patient certain preventive protocols, quality metrics, and best practice recommendations. A written personalized care plan for preventive services as well as general preventive health recommendations were provided to patient.     Alessa Mazur L Janyia Guion, CMA   05/30/2023   After Visit Summary: (Declined) Due to this being a telephonic visit, with patients personalized plan was offered  to patient but patient Declined AVS at this time   Nurse Notes: Patient had no concerns today.

## 2023-05-30 NOTE — Patient Instructions (Signed)
Megan [name] , Thank you for taking time to come for your Medicare Wellness Visit. I appreciate your ongoing commitment to your health goals. Please review the following plan we discussed and let me know if I can assist you in the future.   Referrals/Orders/Follow-Ups/Clinician Recommendations: Each day, aim for 6 glasses of water, plenty of protein in your diet and try to get up and walk/ stretch every hour for 5-10 minutes at a time.    This is a list of the screening recommended for you and due dates:  Health Maintenance  Topic Date Due   Yearly kidney health urinalysis for diabetes  10/09/2021   COVID-19 Vaccine (5 - 2023-24 season) 07/01/2022   Hemoglobin A1C  11/24/2022   Complete foot exam   01/25/2023   Flu Shot  06/01/2023   Eye exam for diabetics  10/20/2023   Yearly kidney function blood test for diabetes  02/10/2024   Medicare Annual Wellness Visit  05/29/2024   DTaP/Tdap/Td vaccine (2 - Td or Tdap) 01/12/2027   Pneumonia Vaccine  Completed   Zoster (Shingles) Vaccine  Completed   HPV Vaccine  Aged Out   DEXA scan (bone density measurement)  Discontinued   Hepatitis C Screening  Discontinued    Advanced directives: (In Chart) A copy of your advanced directives are scanned into your chart should your provider ever need it.  Next Medicare Annual Wellness Visit scheduled for next year: Yes  Preventive Care 5 Years and Older, Female Preventive care refers to lifestyle choices and visits with your health care provider that can promote health and wellness. What does preventive care include? A yearly physical exam. This is also called an annual well check. Dental exams once or twice a year. Routine eye exams. Ask your health care provider how often you should have your eyes checked. Personal lifestyle choices, including: Daily care of your teeth and gums. Regular physical activity. Eating a healthy diet. Avoiding tobacco and drug use. Limiting alcohol use. Practicing  safe sex. Taking low-dose aspirin every day. Taking vitamin and mineral supplements as recommended by your health care provider. What happens during an annual well check? The services and screenings done by your health care provider during your annual well check will depend on your age, overall health, lifestyle risk factors, and family history of disease. Counseling  Your health care provider may ask you questions about your: Alcohol use. Tobacco use. Drug use. Emotional well-being. Home and relationship well-being. Sexual activity. Eating habits. History of falls. Memory and ability to understand (cognition). Work and work Astronomer. Reproductive health. Screening  You may have the following tests or measurements: Height, weight, and BMI. Blood pressure. Lipid and cholesterol levels. These may be checked every 5 years, or more frequently if you are over 6 years old. Skin check. Lung cancer screening. You may have this screening every year starting at age 69 if you have a 30-pack-year history of smoking and currently smoke or have quit within the past 15 years. Fecal occult blood test (FOBT) of the stool. You may have this test every year starting at age 92. Flexible sigmoidoscopy or colonoscopy. You may have a sigmoidoscopy every 5 years or a colonoscopy every 10 years starting at age 38. Hepatitis C blood test. Hepatitis B blood test. Sexually transmitted disease (STD) testing. Diabetes screening. This is done by checking your blood sugar (glucose) after you have not eaten for a while (fasting). You may have this done every 1-3 years. Bone density scan. This is  done to screen for osteoporosis. You may have this done starting at age 60. Mammogram. This may be done every 1-2 years. Talk to your health care provider about how often you should have regular mammograms. Talk with your health care provider about your test results, treatment options, and if necessary, the need for more  tests. Vaccines  Your health care provider may recommend certain vaccines, such as: Influenza vaccine. This is recommended every year. Tetanus, diphtheria, and acellular pertussis (Tdap, Td) vaccine. You may need a Td booster every 10 years. Zoster vaccine. You may need this after age 26. Pneumococcal 13-valent conjugate (PCV13) vaccine. One dose is recommended after age 51. Pneumococcal polysaccharide (PPSV23) vaccine. One dose is recommended after age 70. Talk to your health care provider about which screenings and vaccines you need and how often you need them. This information is not intended to replace advice given to you by your health care provider. Make sure you discuss any questions you have with your health care provider. Document Released: 11/13/2015 Document Revised: 07/06/2016 Document Reviewed: 08/18/2015 Elsevier Interactive Patient Education  2017 [employer].  Fall Prevention in the Home Falls can cause injuries. They can happen to people of all ages. There are many things you can do to make your home safe and to help prevent falls. What can I do on the outside of my home? Regularly fix the edges of walkways and driveways and fix any cracks. Remove anything that might make you trip as you walk through a door, such as a raised step or threshold. Trim any bushes or trees on the path to your home. Use bright outdoor lighting. Clear any walking paths of anything that might make someone trip, such as rocks or tools. Regularly check to see if handrails are loose or broken. Make sure that both sides of any steps have handrails. Any raised decks and porches should have guardrails on the edges. Have any leaves, snow, or ice cleared regularly. Use sand or salt on walking paths during winter. Clean up any spills in your garage right away. This includes oil or grease spills. What can I do in the bathroom? Use night lights. Install grab bars by the toilet and in the tub and shower.  Do not use towel bars as grab bars. Use non-skid mats or decals in the tub or shower. If you need to sit down in the shower, use a plastic, non-slip stool. Keep the floor dry. Clean up any water that spills on the floor as soon as it happens. Remove soap buildup in the tub or shower regularly. Attach bath mats securely with double-sided non-slip rug tape. Do not have throw rugs and other things on the floor that can make you trip. What can I do in the bedroom? Use night lights. Make sure that you have a light by your bed that is easy to reach. Do not use any sheets or blankets that are too big for your bed. They should not hang down onto the floor. Have a firm chair that has side arms. You can use this for support while you get dressed. Do not have throw rugs and other things on the floor that can make you trip. What can I do in the kitchen? Clean up any spills right away. Avoid walking on wet floors. Keep items that you use a lot in easy-to-reach places. If you need to reach something above you, use a strong step stool that has a grab bar. Keep electrical cords out of  the way. Do not use floor polish or wax that makes floors slippery. If you must use wax, use non-skid floor wax. Do not have throw rugs and other things on the floor that can make you trip. What can I do with my stairs? Do not leave any items on the stairs. Make sure that there are handrails on both sides of the stairs and use them. Fix handrails that are broken or loose. Make sure that handrails are as long as the stairways. Check any carpeting to make sure that it is firmly attached to the stairs. Fix any carpet that is loose or worn. Avoid having throw rugs at the top or bottom of the stairs. If you do have throw rugs, attach them to the floor with carpet tape. Make sure that you have a light switch at the top of the stairs and the bottom of the stairs. If you do not have them, ask someone to add them for you. What else  can I do to help prevent falls? Wear shoes that: Do not have high heels. Have rubber bottoms. Are comfortable and fit you well. Are closed at the toe. Do not wear sandals. If you use a stepladder: Make sure that it is fully opened. Do not climb a closed stepladder. Make sure that both sides of the stepladder are locked into place. Ask someone to hold it for you, if possible. Clearly mark and make sure that you can see: Any grab bars or handrails. First and last steps. Where the edge of each step is. Use tools that help you move around (mobility aids) if they are needed. These include: Canes. Walkers. Scooters. Crutches. Turn on the lights when you go into a dark area. Replace any light bulbs as soon as they burn out. Set up your furniture so you have a clear path. Avoid moving your furniture around. If any of your floors are uneven, fix them. If there are any pets around you, be aware of where they are. Review your medicines with your doctor. Some medicines can make you feel dizzy. This can increase your chance of falling. Ask your doctor what other things that you can do to help prevent falls. This information is not intended to replace advice given to you by your health care provider. Make sure you discuss any questions you have with your health care provider. Document Released: 08/13/2009 Document Revised: 03/24/2016 Document Reviewed: 11/21/2014 Elsevier Interactive Patient Education  2017 [employer].

## 2023-06-07 DIAGNOSIS — E139 Other specified diabetes mellitus without complications: Secondary | ICD-10-CM | POA: Diagnosis not present

## 2023-06-07 DIAGNOSIS — I10 Essential (primary) hypertension: Secondary | ICD-10-CM | POA: Diagnosis not present

## 2023-06-07 DIAGNOSIS — E782 Mixed hyperlipidemia: Secondary | ICD-10-CM | POA: Diagnosis not present

## 2023-06-13 ENCOUNTER — Encounter: Payer: Self-pay | Admitting: Internal Medicine

## 2023-06-13 DIAGNOSIS — E038 Other specified hypothyroidism: Secondary | ICD-10-CM | POA: Insufficient documentation

## 2023-06-13 NOTE — Patient Instructions (Addendum)
Blood work was ordered.   The lab is on the first floor.    Medications changes include :   inhalers    A referral was ordered for Dermatology specialists and cardiology and someone will call you to schedule an appointment.     Return in about 6 months (around 12/15/2023) for follow up.   Health Maintenance, Female Adopting a healthy lifestyle and getting preventive care are important in promoting health and wellness. Ask your health care provider about: The right schedule for you to have regular tests and exams. Things you can do on your own to prevent diseases and keep yourself healthy. What should I know about diet, weight, and exercise? Eat a healthy diet  Eat a diet that includes plenty of vegetables, fruits, low-fat dairy products, and lean protein. Do not eat a lot of foods that are high in solid fats, added sugars, or sodium. Maintain a healthy weight Body mass index (BMI) is used to identify weight problems. It estimates body fat based on height and weight. Your health care provider can help determine your BMI and help you achieve or maintain a healthy weight. Get regular exercise Get regular exercise. This is one of the most important things you can do for your health. Most adults should: Exercise for at least 150 minutes each week. The exercise should increase your heart rate and make you sweat (moderate-intensity exercise). Do strengthening exercises at least twice a week. This is in addition to the moderate-intensity exercise. Spend less time sitting. Even light physical activity can be beneficial. Watch cholesterol and blood lipids Have your blood tested for lipids and cholesterol at 82 years of age, then have this test every 5 years. Have your cholesterol levels checked more often if: Your lipid or cholesterol levels are high. You are older than 81 years of age. You are at high risk for heart disease. What should I know about cancer screening? Depending  on your health history and family history, you may need to have cancer screening at various ages. This may include screening for: Breast cancer. Cervical cancer. Colorectal cancer. Skin cancer. Lung cancer. What should I know about heart disease, diabetes, and high blood pressure? Blood pressure and heart disease High blood pressure causes heart disease and increases the risk of stroke. This is more likely to develop in people who have high blood pressure readings or are overweight. Have your blood pressure checked: Every 3-5 years if you are 39-47 years of age. Every year if you are 64 years old or older. Diabetes Have regular diabetes screenings. This checks your fasting blood sugar level. Have the screening done: Once every three years after age 68 if you are at a normal weight and have a low risk for diabetes. More often and at a younger age if you are overweight or have a high risk for diabetes. What should I know about preventing infection? Hepatitis B If you have a higher risk for hepatitis B, you should be screened for this virus. Talk with your health care provider to find out if you are at risk for hepatitis B infection. Hepatitis C Testing is recommended for: Everyone born from [birth year] through 1964. Anyone with known risk factors for hepatitis C. Sexually transmitted infections (STIs) Get screened for STIs, including gonorrhea and chlamydia, if: You are sexually active and are younger than 81 years of age. You are older than 81 years of age and your health care provider tells you that you are  at risk for this type of infection. Your sexual activity has changed since you were last screened, and you are at increased risk for chlamydia or gonorrhea. Ask your health care provider if you are at risk. Ask your health care provider about whether you are at high risk for HIV. Your health care provider may recommend a prescription medicine to help prevent HIV infection. If you choose to  take medicine to prevent HIV, you should first get tested for HIV. You should then be tested every 3 months for as long as you are taking the medicine. Pregnancy If you are about to stop having your period (premenopausal) and you may become pregnant, seek counseling before you get pregnant. Take 400 to 800 micrograms (mcg) of folic acid every day if you become pregnant. Ask for birth control (contraception) if you want to prevent pregnancy. Osteoporosis and menopause Osteoporosis is a disease in which the bones lose minerals and strength with aging. This can result in bone fractures. If you are 42 years old or older, or if you are at risk for osteoporosis and fractures, ask your health care provider if you should: Be screened for bone loss. Take a calcium or vitamin D supplement to lower your risk of fractures. Be given hormone replacement therapy (HRT) to treat symptoms of menopause. Follow these instructions at home: Alcohol use Do not drink alcohol if: Your health care provider tells you not to drink. You are pregnant, may be pregnant, or are planning to become pregnant. If you drink alcohol: Limit how much you have to: 0-1 drink a day. Know how much alcohol is in your drink. In the [place] one drink equals one 12 oz bottle of beer (355 mL), one 5 oz glass of wine (148 mL), or one 1 oz glass of hard liquor (44 mL). Lifestyle Do not use any products that contain nicotine or tobacco. These products include cigarettes, chewing tobacco, and vaping devices, such as e-cigarettes. If you need help quitting, ask your health care provider. Do not use street drugs. Do not share needles. Ask your health care provider for help if you need support or information about quitting drugs. General instructions Schedule regular health, dental, and eye exams. Stay current with your vaccines. Tell your health care provider if: You often feel depressed. You have ever been abused or do not feel safe at  home. Summary Adopting a healthy lifestyle and getting preventive care are important in promoting health and wellness. Follow your health care provider's instructions about healthy diet, exercising, and getting tested or screened for diseases. Follow your health care provider's instructions on monitoring your cholesterol and blood pressure. This information is not intended to replace advice given to you by your health care provider. Make sure you discuss any questions you have with your health care provider. Document Revised: 03/08/2021 Document Reviewed: 03/08/2021 Elsevier Patient Education  2024 [employer].

## 2023-06-13 NOTE — Progress Notes (Unsigned)
Subjective:    Patient ID: Megan [name], female    DOB: [birth year], 82 y.o.   MRN: [id number]      HPI Megan [name] is here for a Physical exam and her chronic medical problems.   Having a lot of SOB and lack of energy - it started about 10 years ago.  It has gotten worse - just progressively.  She has no stamina and zero energy.  Needs referral to derm for her psoriasis   Medications and allergies reviewed with patient and updated if appropriate.  Current Outpatient Medications on File Prior to Visit  Medication Sig Dispense Refill   albuterol (PROVENTIL HFA;VENTOLIN HFA) 108 (90 Base) MCG/ACT inhaler Inhale 2 puffs into the lungs every 6 (six) hours as needed for wheezing or shortness of breath. 1 Inhaler 2   amLODipine (NORVASC) 10 MG tablet TAKE 1 TABLET BY MOUTH EVERY DAY 90 tablet 1   augmented betamethasone dipropionate (DIPROLENE-AF) 0.05 % ointment      B-D UF III MINI PEN NEEDLES 31G X 5 MM MISC USE AS DIRECTED WITH INSULIN 4 TIMES A DAY 100 each 3   betamethasone dipropionate (DIPROLENE) 0.05 % cream Apply topically 2 (two) times daily. 45 g 0   budesonide-formoterol (SYMBICORT) 80-4.5 MCG/ACT inhaler Inhale 2 puffs into the lungs 2 (two) times daily. (Patient taking differently: Inhale 2 puffs into the lungs daily as needed (Wheezing).) 1 Inhaler 6   Continuous Blood Gluc Sensor (DEXCOM G6 SENSOR) MISC Apply new sensor every 10 days for continuous glucose monitoring. 9 each 3   Continuous Blood Gluc Transmit (DEXCOM G6 TRANSMITTER) MISC Use one transmitter every 3 months for continuous glucose monitoring. 1 each 0   DULoxetine (CYMBALTA) 60 MG capsule TAKE 1 CAPSULE BY MOUTH TWICE A DAY 180 capsule 1   Fluocinolone Acetonide Scalp 0.01 % OIL      fluocinonide (LIDEX) 0.05 % external solution      GVOKE HYPOPEN 1-PACK 1 MG/0.2ML SOAJ Inject under skin 0.2 ml as needed for hypoglycemia 0.2 mL prn   insulin aspart (NOVOLOG FLEXPEN) 100 UNIT/ML FlexPen INJECT 18-22  UNITS UNDER THE SKIN THREE TIMES DAILY 45 mL 3   insulin degludec (TRESIBA FLEXTOUCH) 100 UNIT/ML FlexTouch Pen Inject up to 36-38 units into the skin daily 30 mL 3   ketoconazole (NIZORAL) 2 % cream      OTEZLA 30 MG TABS      pravastatin (PRAVACHOL) 40 MG tablet TAKE 1 TABLET(40 MG) BY MOUTH EVERY EVENING 90 tablet 1   No current facility-administered medications on file prior to visit.    Review of Systems  Constitutional:  Positive for fatigue. Negative for fever.       Decreased stamina  Eyes:  Negative for visual disturbance.  Respiratory:  Positive for cough, shortness of breath and wheezing.   Cardiovascular:  Positive for palpitations and leg swelling (right leg > left leg). Negative for chest pain.  Gastrointestinal:  Negative for abdominal pain, blood in stool, constipation and diarrhea.       No gerd  Genitourinary:  Negative for dysuria.  Musculoskeletal:  Positive for arthralgias (osteoarthritis) and back pain.  Skin:  Positive for rash (psoriasis).  Neurological:  Positive for light-headedness (occ - fuzziness). Negative for headaches.  Psychiatric/Behavioral:  Positive for sleep disturbance (interrupted). Negative for dysphoric mood. The patient is not nervous/anxious.        Objective:   Vitals:   06/14/23 1303  BP: 114/74  Pulse: (!) 116  Temp:  97.6 F (36.4 C)  SpO2: 97%   Filed Weights   06/14/23 1303  Weight: 167 lb (75.8 kg)   Body mass index is 27.37 kg/m.  BP Readings from Last 3 Encounters:  06/14/23 114/74  04/06/23 104/74  02/15/23 129/80    Wt Readings from Last 3 Encounters:  06/14/23 167 lb (75.8 kg)  04/06/23 172 lb (78 kg)  02/15/23 172 lb 3.2 oz (78.1 kg)       Physical Exam Constitutional: She appears well-developed and well-nourished. No distress.  HENT:  Head: Normocephalic and atraumatic.  Right Ear: External ear normal. Normal ear canal and TM Left Ear: External ear normal.  Normal ear canal and TM Mouth/Throat:  Oropharynx is clear and moist.  Eyes: Conjunctivae normal.  Neck: Neck supple. No tracheal deviation present. No thyromegaly present.  No carotid bruit  Cardiovascular: Tachycardic, regular rhythm and normal heart sounds.   No murmur heard.  No edema. Pulmonary/Chest: Effort normal and breath sounds normal. No respiratory distress. She has no wheezes. She has no rales.  Breast: deferred   Abdominal: Soft. She exhibits no distension. There is no tenderness.  Lymphadenopathy: She has no cervical adenopathy.  Skin: Skin is warm and dry. She is not diaphoretic.  Psychiatric: She has a normal mood and affect. Her behavior is normal.     Lab Results  Component Value Date   WBC 7.8 02/10/2023   HGB 13.6 02/10/2023   HCT 41.6 02/10/2023   PLT 228 02/10/2023   GLUCOSE 250 (H) 02/10/2023   CHOL 143 03/25/2022   TRIG 67.0 03/25/2022   HDL 55.60 03/25/2022   LDLDIRECT 156.0 05/10/2019   LDLCALC 74 03/25/2022   ALT 12 02/10/2023   AST 16 02/10/2023   NA 141 02/10/2023   K 3.1 (L) 02/10/2023   CL 101 02/10/2023   CREATININE 0.99 02/10/2023   BUN 16 02/10/2023   CO2 33 (H) 02/10/2023   TSH 8.675 (H) 02/28/2016   INR 1.17 10/10/2018   HGBA1C 8.1 (A) 05/24/2022   MICROALBUR 9.4 (H) 10/09/2020         Assessment & Plan:   Physical exam: Screening blood work  ordered Exercise  walks a little-encouraged increased exercise Weight  is ok Substance abuse  none   Reviewed recommended immunizations.   Health Maintenance  Topic Date Due   Diabetic kidney evaluation - Urine ACR  10/09/2021   COVID-19 Vaccine (5 - 2023-24 season) 07/01/2022   HEMOGLOBIN A1C  11/24/2022   FOOT EXAM  01/25/2023   INFLUENZA VACCINE  06/01/2023   OPHTHALMOLOGY EXAM  10/20/2023   Diabetic kidney evaluation - eGFR measurement  02/10/2024   Medicare Annual Wellness (AWV)  05/29/2024   DTaP/Tdap/Td (2 - Td or Tdap) 01/12/2027   Pneumonia Vaccine 68+ Years old  Completed   Zoster Vaccines- Shingrix   Completed   HPV VACCINES  Aged Out   DEXA SCAN  Discontinued   Hepatitis C Screening  Discontinued          See Problem List for Assessment and Plan of chronic medical problems.

## 2023-06-14 ENCOUNTER — Ambulatory Visit (INDEPENDENT_AMBULATORY_CARE_PROVIDER_SITE_OTHER): Payer: Medicare PPO | Admitting: Internal Medicine

## 2023-06-14 VITALS — BP 114/74 | HR 116 | Temp 97.6°F | Ht 65.5 in | Wt 167.0 lb

## 2023-06-14 DIAGNOSIS — L409 Psoriasis, unspecified: Secondary | ICD-10-CM | POA: Diagnosis not present

## 2023-06-14 DIAGNOSIS — E538 Deficiency of other specified B group vitamins: Secondary | ICD-10-CM

## 2023-06-14 DIAGNOSIS — E038 Other specified hypothyroidism: Secondary | ICD-10-CM

## 2023-06-14 DIAGNOSIS — Z0000 Encounter for general adult medical examination without abnormal findings: Secondary | ICD-10-CM

## 2023-06-14 DIAGNOSIS — J45909 Unspecified asthma, uncomplicated: Secondary | ICD-10-CM | POA: Insufficient documentation

## 2023-06-14 DIAGNOSIS — E559 Vitamin D deficiency, unspecified: Secondary | ICD-10-CM

## 2023-06-14 DIAGNOSIS — G620 Drug-induced polyneuropathy: Secondary | ICD-10-CM

## 2023-06-14 DIAGNOSIS — R0602 Shortness of breath: Secondary | ICD-10-CM | POA: Diagnosis not present

## 2023-06-14 DIAGNOSIS — I10 Essential (primary) hypertension: Secondary | ICD-10-CM | POA: Diagnosis not present

## 2023-06-14 DIAGNOSIS — E782 Mixed hyperlipidemia: Secondary | ICD-10-CM | POA: Diagnosis not present

## 2023-06-14 DIAGNOSIS — I700 Atherosclerosis of aorta: Secondary | ICD-10-CM

## 2023-06-14 DIAGNOSIS — E139 Other specified diabetes mellitus without complications: Secondary | ICD-10-CM | POA: Diagnosis not present

## 2023-06-14 DIAGNOSIS — J4530 Mild persistent asthma, uncomplicated: Secondary | ICD-10-CM

## 2023-06-14 LAB — CBC WITH DIFFERENTIAL/PLATELET
Basophils Absolute: 0.1 10*3/uL (ref 0.0–0.1)
Basophils Relative: 1 % (ref 0.0–3.0)
Eosinophils Absolute: 0.7 10*3/uL (ref 0.0–0.7)
Eosinophils Relative: 6.3 % — ABNORMAL HIGH (ref 0.0–5.0)
HCT: 43.7 % (ref 36.0–46.0)
Hemoglobin: 14 g/dL (ref 12.0–15.0)
Lymphocytes Relative: 15.2 % (ref 12.0–46.0)
Lymphs Abs: 1.7 10*3/uL (ref 0.7–4.0)
MCHC: 32 g/dL (ref 30.0–36.0)
MCV: 87.7 fl (ref 78.0–100.0)
Monocytes Absolute: 0.8 10*3/uL (ref 0.1–1.0)
Monocytes Relative: 6.8 % (ref 3.0–12.0)
Neutro Abs: 7.9 10*3/uL — ABNORMAL HIGH (ref 1.4–7.7)
Neutrophils Relative %: 70.7 % (ref 43.0–77.0)
Platelets: 284 10*3/uL (ref 150.0–400.0)
RBC: 4.99 Mil/uL (ref 3.87–5.11)
RDW: 15.7 % — ABNORMAL HIGH (ref 11.5–15.5)
WBC: 11.2 10*3/uL — ABNORMAL HIGH (ref 4.0–10.5)

## 2023-06-14 LAB — COMPREHENSIVE METABOLIC PANEL
ALT: 15 U/L (ref 0–35)
AST: 18 U/L (ref 0–37)
Albumin: 4.1 g/dL (ref 3.5–5.2)
Alkaline Phosphatase: 111 U/L (ref 39–117)
BUN: 17 mg/dL (ref 6–23)
CO2: 32 mEq/L (ref 19–32)
Calcium: 10.3 mg/dL (ref 8.4–10.5)
Chloride: 101 mEq/L (ref 96–112)
Creatinine, Ser: 0.94 mg/dL (ref 0.40–1.20)
GFR: 56.9 mL/min — ABNORMAL LOW (ref >60.00)
Glucose, Bld: 80 mg/dL (ref 70–99)
Potassium: 3.4 mEq/L — ABNORMAL LOW (ref 3.5–5.1)
Sodium: 141 mEq/L (ref 135–145)
Total Bilirubin: 0.5 mg/dL (ref 0.2–1.2)
Total Protein: 7.1 g/dL (ref 6.0–8.3)

## 2023-06-14 LAB — LIPID PANEL
Cholesterol: 170 mg/dL (ref 0–200)
HDL: 47.5 mg/dL (ref >39.00)
LDL Cholesterol: 88 mg/dL (ref 0–99)
NonHDL: 122.08
Total CHOL/HDL Ratio: 4
Triglycerides: 171 mg/dL — ABNORMAL HIGH (ref 0.0–149.0)
VLDL: 34.2 mg/dL (ref 0.0–40.0)

## 2023-06-14 LAB — HEMOGLOBIN A1C: Hgb A1c MFr Bld: 7.8 % — ABNORMAL HIGH (ref 4.6–6.5)

## 2023-06-14 LAB — VITAMIN D 25 HYDROXY (VIT D DEFICIENCY, FRACTURES): VITD: 13.28 ng/mL — ABNORMAL LOW (ref 30.00–100.00)

## 2023-06-14 LAB — VITAMIN B12: Vitamin B-12: 304 pg/mL (ref 211–911)

## 2023-06-14 LAB — TSH: TSH: 2.47 u[IU]/mL (ref 0.35–5.50)

## 2023-06-14 MED ORDER — ALBUTEROL SULFATE HFA 108 (90 BASE) MCG/ACT IN AERS: 2.0000 | INHALATION_SPRAY | Freq: Four times a day (QID) | RESPIRATORY_TRACT | 2 refills | Status: DC | PRN

## 2023-06-14 MED ORDER — BUDESONIDE-FORMOTEROL FUMARATE 80-4.5 MCG/ACT IN AERO: 2.0000 | INHALATION_SPRAY | Freq: Two times a day (BID) | RESPIRATORY_TRACT | 6 refills | Status: DC

## 2023-06-14 NOTE — Assessment & Plan Note (Signed)
Chronic Per neurology Continue cymbalta 60 mg bid

## 2023-06-14 NOTE — Assessment & Plan Note (Signed)
Chronic ?  Ideally controlled or not-sounds like it is not She is not taking her inhalers regularly Start Symbicort inhaler twice daily every day-reminded to rinse after use Continue albuterol as needed

## 2023-06-14 NOTE — Assessment & Plan Note (Signed)
History of B12 deficiency Will check level

## 2023-06-14 NOTE — Assessment & Plan Note (Signed)
Chronic Blood pressure well controlled CMP, cbc Continue amlodipine 10 mg daily

## 2023-06-14 NOTE — Assessment & Plan Note (Signed)
Chronic Continue pravastatin 40 mg daily Check lipids Encouraged improving diet, exercise

## 2023-06-14 NOTE — Assessment & Plan Note (Signed)
Chronic Management per endocrine-following with Dennie Maizes Check urine microalbumin, A1c today

## 2023-06-14 NOTE — Assessment & Plan Note (Signed)
Chronic Not currently controlled Does not have any active psoriatic arthritis-recently saw rheumatology Would like to start medication for her psoriasis, which her previous dermatologist did not feel comfortable doing Referral to dermatology ordered

## 2023-06-14 NOTE — Assessment & Plan Note (Addendum)
Chronic ?  Contributing to fatigue Check tsh

## 2023-06-14 NOTE — Assessment & Plan Note (Signed)
Chronic Regular exercise and healthy diet encouraged Check lipid panel  Continue pravastatin 40 mg daily 

## 2023-06-14 NOTE — Assessment & Plan Note (Signed)
Chronic Progressively worsening, but not new Did have lobectomy for lung cancer years ago which is likely contributing Deconditioning is likely contributing-encouraged her to increase her exercise Asthma may not be ideally controlled-start Symbicort twice daily and use albuterol as needed ?  Cardiac Has not seen cardiology in a while-will refer back to cardiology to rule out cardiac cause

## 2023-06-14 NOTE — Assessment & Plan Note (Addendum)
Chronic Not taking vitamin D daily Check vitamin D level-advised that she needs to start taking this on a daily basis

## 2023-06-15 LAB — MICROALBUMIN / CREATININE URINE RATIO
Creatinine,U: 40.6 mg/dL
Microalb Creat Ratio: 3.2 mg/g (ref 0.0–30.0)
Microalb, Ur: 1.3 mg/dL (ref 0.0–1.9)

## 2023-06-30 NOTE — Progress Notes (Signed)
Office Visit Note  Patient: Megan [name]             Date of Birth: December 28, 1940           MRN: [id number]             PCP: Pincus Sanes, MD Referring: Pincus Sanes, MD Visit Date: 07/12/2023 Occupation: @GUAROCC @  Subjective:  Pain in multiple joints  History of Present Illness: Megan [name] is a 82 y.o. female with psoriatic arthritis, psoriasis and osteoarthritis.  She states she has been experiencing increased pain and discomfort in her bilateral hands, hip joints and knee joints.  She has been taking Otezla 30 mg p.o. twice daily prescribed by Dr. Doreen Beam.  She had no interruption in the therapy.  She notices some swelling in her left knee and right ankle.  She states she had left knee joint injection Dr. Gracelyn Nurse in July.  She stated did not help her much.  Patient states that her dermatologist did not start her on Skyrizi.    Activities of Daily Living:  Patient reports morning stiffness for all day.   Patient Reports nocturnal pain.  Difficulty dressing/grooming: Denies Difficulty climbing stairs: Denies Difficulty getting out of chair: Denies Difficulty using hands for taps, buttons, cutlery, and/or writing: Reports  Review of Systems  Constitutional:  Positive for fatigue.  HENT:  Negative for mouth sores and mouth dryness.   Eyes:  Negative for dryness.  Respiratory:  Positive for shortness of breath.   Cardiovascular:  Negative for chest pain and palpitations.  Gastrointestinal:  Negative for blood in stool, constipation and diarrhea.  Endocrine: Negative for increased urination.  Genitourinary:  Positive for nocturia. Negative for involuntary urination.  Musculoskeletal:  Positive for joint pain, gait problem, joint pain, joint swelling, myalgias, muscle weakness, morning stiffness, muscle tenderness and myalgias.  Skin:  Positive for rash and hair loss. Negative for color change and sensitivity to sunlight.  Allergic/Immunologic: Negative for  susceptible to infections.  Neurological:  Negative for dizziness and headaches.  Hematological:  Negative for swollen glands.  Psychiatric/Behavioral:  Positive for sleep disturbance. Negative for depressed mood. The patient is not nervous/anxious.     PMFS History:  Patient Active Problem List   Diagnosis Date Noted   Vitamin B12 deficiency 06/14/2023   Asthma 06/14/2023   Subclinical hypothyroidism 06/13/2023   Primary osteoarthritis of left knee 04/10/2023   Keratosis 07/15/2022   Onychomycosis of nail of digit of hand 03/03/2022   Nocturia 11/24/2021   OAB (overactive bladder) 11/24/2021   Urge incontinence 11/24/2021   Urinary urgency 11/24/2021   Vaginal atrophy 11/24/2021   Vitamin D deficiency 10/12/2021   Mixed hyperlipidemia 10/12/2021   Frequent UTI 10/12/2021   Aortic atherosclerosis (HCC) 10/09/2020   Fall 05/04/2020   Head pain 05/10/2019   GERD (gastroesophageal reflux disease) 10/17/2018   Herpes zoster without complication 03/01/2018   Right elbow pain 03/01/2018   Carpal tunnel syndrome on right 11/30/2017   Cough 08/22/2017   Primary osteoarthritis of both hands 03/08/2017   Poor balance 02/07/2017   Subarachnoid bleed (HCC) 01/11/2017   Psoriatic arthropathy (HCC) 09/08/2016   Hypertension 09/08/2016   Diabetic neuropathy (HCC) 09/08/2016   LADA (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), managed as type 1 (HCC) 05/04/2016   Syncope 03/25/2016   RLS (restless legs syndrome) 12/11/2015   Lumbago 05/06/2015   Peripheral edema 04/14/2015   Psoriasis 04/14/2015   Emphysema of lung (HCC) 11/26/2014   Radiation-induced esophageal stricture 10/01/2014  DNR (do not resuscitate) discussion 08/21/2014   Fatigue 07/18/2014   Neuropathy due to chemotherapeutic drug (HCC) 07/18/2014   Muscle cramping 07/18/2014   SOB (shortness of breath) 04/28/2014   Radiation esophagitis 04/10/2014   Cancer of middle lobe of lung (HCC) 01/17/2014   Malignant neoplasm of  right upper lobe of lung (HCC) 11/28/2013    Past Medical History:  Diagnosis Date   Cancer (HCC) dx'd 09/2013   Lung ca   Concussion 2018   Diabetes (HCC) 09/08/2016   type I diabetic patient reported on 09/13/16    Diabetes mellitus    insulin   Diabetic neuropathy (HCC) 09/08/2016   Fall 2018   GERD (gastroesophageal reflux disease)    no meds for   History of colitis    History of hiatal hernia    Hx of radiation therapy 12/16/13-01/30/14   lung 66Gy   Hypertension    Malignant neoplasm of right upper lobe of lung (HCC) 11/28/2013   Neuropathy    Psoriasis    Psoriatic arthritis (HCC) 09/08/2016   Rheumatoid arthritis (HCC)    Shortness of breath dyspnea    with exertion    Family History  Problem Relation Age of Onset   Other Mother    Anuerysm Mother        brain   Other Father        failure to thrive   Hypertension Other    Stroke Other    Heart attack Other    Hemachromatosis Other        great grandfather   Rheum arthritis Other        both sets of grandparents and father   Migraines Neg Hx    Colon cancer Neg Hx    Colon polyps Neg Hx    Esophageal cancer Neg Hx    Pancreatic cancer Neg Hx    Stomach cancer Neg Hx    Liver disease Neg Hx    Past Surgical History:  Procedure Laterality Date   ABDOMINAL HYSTERECTOMY     ABDOMINAL SURGERY     BALLOON DILATION N/A 12/12/2014   Procedure: BALLOON DILATION;  Surgeon: Louis Meckel, MD;  Location: [facility];  Service: Endoscopy;  Laterality: N/A;   BIOPSY  01/20/2020   Procedure: BIOPSY;  Surgeon: Sherrilyn Rist, MD;  Location: [facility];  Service: Gastroenterology;;   CHOLECYSTECTOMY     COLONOSCOPY N/A 02/07/2016   Procedure: COLONOSCOPY;  Surgeon: Sherrilyn Rist, MD;  Location: [facility] ENDOSCOPY;  Service: Endoscopy;  Laterality: N/A;   DILATION AND CURETTAGE OF UTERUS     ESOPHAGOGASTRODUODENOSCOPY N/A 12/12/2014   Procedure: ESOPHAGOGASTRODUODENOSCOPY (EGD);  Surgeon: Louis Meckel, MD;   Location: [facility];  Service: Endoscopy;  Laterality: N/A;  with dilation   ESOPHAGOGASTRODUODENOSCOPY (EGD) WITH PROPOFOL N/A 10/09/2014   Procedure: ESOPHAGOGASTRODUODENOSCOPY (EGD) WITH PROPOFOL;  Surgeon: Louis Meckel, MD;  Location: [facility] ENDOSCOPY;  Service: Endoscopy;  Laterality: N/A;   ESOPHAGOGASTRODUODENOSCOPY (EGD) WITH PROPOFOL N/A 09/19/2016   Procedure: ESOPHAGOGASTRODUODENOSCOPY (EGD) WITH PROPOFOL;  Surgeon: Sherrilyn Rist, MD;  Location: [facility];  Service: Gastroenterology;  Laterality: N/A;  with savary dil tt   ESOPHAGOGASTRODUODENOSCOPY (EGD) WITH PROPOFOL N/A 01/20/2020   Procedure: ESOPHAGOGASTRODUODENOSCOPY (EGD) WITH PROPOFOL;  Surgeon: Sherrilyn Rist, MD;  Location: [facility];  Service: Gastroenterology;  Laterality: N/A;  Fluoro-Savary   ORIF ANKLE FRACTURE Right 07/04/2015   Procedure: OPEN REDUCTION INTERNAL FIXATION (ORIF) ANKLE FRACTURE;  Surgeon: Nadara Mustard,  MD;  Location: [facility] OR;  Service: Orthopedics;  Laterality: Right;  OPEN REDUCTION, INTERNAL FIXATION OF RIGHT ANKLE FRACTURE.    ORIF ANKLE FRACTURE Left 02/10/2016   Procedure: OPEN REDUCTION INTERNAL FIXATION (ORIF) ANKLE FRACTURE;  Surgeon: Nadara Mustard, MD;  Location: [facility] OR;  Service: Orthopedics;  Laterality: Left;   SAVORY DILATION N/A 10/09/2014   Procedure: SAVORY DILATION;  Surgeon: Louis Meckel, MD;  Location: [facility] ENDOSCOPY;  Service: Endoscopy;  Laterality: N/A;   SAVORY DILATION N/A 09/19/2016   Procedure: SAVORY DILATION;  Surgeon: Sherrilyn Rist, MD;  Location: [facility];  Service: Gastroenterology;  Laterality: N/A;   SAVORY DILATION N/A 01/20/2020   Procedure: SAVORY DILATION;  Surgeon: Sherrilyn Rist, MD;  Location: [facility];  Service: Gastroenterology;  Laterality: N/A;   TONSILLECTOMY     Social History   Social History Narrative   Patient lives at home. She has an old friend who is her caregiver, Madelaine Bhat, who stays with her 24/7.   Caffeine Use:  1 cups daily   Right handed   Immunization History  Administered Date(s) Administered   Fluad Quad(high Dose 65+) 07/31/2019, 10/01/2020, 07/15/2022   Influenza, High Dose Seasonal PF 08/12/2016, 08/30/2017, 09/07/2018, 08/24/2021   Influenza,inj,Quad PF,6+ Mos 11/07/2013, 07/14/2014, 08/25/2015   Influenza-Unspecified 08/07/2018, 09/07/2018   PFIZER(Purple Top)SARS-COV-2 Vaccination 11/28/2019, 12/25/2019, 06/18/2020   PPD Test 03/02/2016   Pfizer Covid-19 Vaccine Bivalent Booster 40yrs & up 07/27/2021   Pneumococcal Conjugate-13 05/10/2019   Pneumococcal Polysaccharide-23 03/19/2018   Pneumococcal-Unspecified 12/02/2015   Tdap 01/11/2017   Zoster Recombinant(Shingrix) 02/14/2018, 08/07/2018     Objective: Vital Signs: BP 105/72 (BP Location: Left Arm, Patient Position: Sitting, Cuff Size: Normal)   Pulse (!) 120   Resp 15   Ht 5\' 5"  (1.651 m)   Wt 172 lb 3.2 oz (78.1 kg)   BMI 28.66 kg/m    Physical Exam Vitals and nursing note reviewed.  Constitutional:      Appearance: She is well-developed.  HENT:     Head: Normocephalic and atraumatic.  Eyes:     Conjunctiva/sclera: Conjunctivae normal.  Cardiovascular:     Rate and Rhythm: Normal rate and regular rhythm.     Heart sounds: Normal heart sounds.  Pulmonary:     Effort: Pulmonary effort is normal.     Breath sounds: Normal breath sounds.  Abdominal:     General: Bowel sounds are normal.     Palpations: Abdomen is soft.  Musculoskeletal:     Cervical back: Normal range of motion.  Lymphadenopathy:     Cervical: No cervical adenopathy.  Skin:    General: Skin is warm and dry.     Capillary Refill: Capillary refill takes less than 2 seconds.     Comments: Extensive psoriasis was noted on her scalp, chest, back and extremities.  Neurological:     Mental Status: She is alert and oriented to person, place, and time.  Psychiatric:        Behavior: Behavior normal.      Musculoskeletal Exam: Cervical spine was  in good range of motion.  Thoracic kyphosis was noted.  She had no tenderness over thoracic or lumbar spine.  She had tenderness over left SI joint.  Shoulders, elbows, wrist joints with good range of motion.  She had bilateral CMC PIP and DIP thickening with no synovitis.  She had discomfort with range of motion of her hip joints.  She had limitation range of motion of her left  hip joint.  Knee joints were in good range of motion with warmth on palpation of bilateral knee joints.  She had warmth on palpation of bilateral ankle joints.  She had no tenderness over MTPs or PIPs.  CDAI Exam: CDAI Score: -- Patient Global: --; Provider Global: -- Swollen: --; Tender: -- Joint Exam 07/12/2023   No joint exam has been documented for this visit   There is currently no information documented on the homunculus. Go to the Rheumatology activity and complete the homunculus joint exam.  Investigation: No additional findings.  Imaging: No results found.  Recent Labs: Lab Results  Component Value Date   WBC 11.2 (H) 06/14/2023   HGB 14.0 06/14/2023   PLT 284.0 06/14/2023   NA 141 06/14/2023   K 3.4 (L) 06/14/2023   CL 101 06/14/2023   CO2 32 06/14/2023   GLUCOSE 80 06/14/2023   BUN 17 06/14/2023   CREATININE 0.94 06/14/2023   BILITOT 0.5 06/14/2023   ALKPHOS 111 06/14/2023   AST 18 06/14/2023   ALT 15 06/14/2023   PROT 7.1 06/14/2023   ALBUMIN 4.1 06/14/2023   CALCIUM 10.3 06/14/2023   GFRAA 56 (L) 02/03/2020    Speciality Comments: Remicade-anaphylaxis  Procedures:  No procedures performed Allergies: Carboplatin, Remicade [infliximab], Other, Rifampin, and Hydromorphone   Assessment / Plan:     Visit Diagnoses: Psoriatic arthropathy (HCC) - Patient was initially diagnosed with psoriasis and psoriatic arthritis by Dr. Phylliss Bob in 2004.  She was initially treated with IV Remicade and then was switched to Humira in 2010 after an anaphylactic reaction to IV Remicade.  Humira was  discontinued in 2015 after patient developed lung cancer.  She underwent chemotherapy and radiation therapy.  She was on sulfasalazine until 2018 intermittently.  She had been on Otezla by Dr. Marlana Salvage fourth since 2018.  Patient states she has an inadequate response to [place].  She continues to have pain and swelling in multiple joints.  She complains of pain and discomfort in the bilateral knee joints, left hip and her ankles.  She states she has had cortisone injection to her knee joints without an adequate response.  She continues to have pain and swelling in her knees and ankles.  She had warmth on palpation today.  Her plan was to go on Skyrizi by Dr. Doreen Beam.  She states Dr. Izetta Dakin did not prescribe Skyrizi.  She is trying to find another dermatologist with [facility].  I did detailed discussion with the patient regarding Skyrizi.  I have sent a message to Dr. Arbutus Ped to get approval to start her on Skyrizi.  Indications side effects contraindications including increased risk of infection was discussed at length.  A handout was given and consent was taken.  We will apply for Skyrizi once we get approval from Dr. Arbutus Ped.  Plan: Sedimentation rate  Psoriasis-she has extensive psoriasis involving her scalp, extremities and back.  High risk medication use - Otezla 30 mg tablet by mouth twice a day prescribed by Dr. Doreen Beam. -In anticipation to start on Skyrizi I will obtain following labs today.  Plan: CBC with Differential/Platelet, Hepatitis B core antibody, IgM, Hepatitis B surface antigen, Hepatitis C antibody, QuantiFERON-TB Gold Plus, Serum protein electrophoresis with reflex, IgG, IgA, IgM.  Patient was advised to get labs in a month after starting Skyrizi and then every 3 months.  Information or immunization was placed in AVS.  She was advised to hold Skyrizi if she develops an infection resume after the infection resolves.  Primary  osteoarthritis of both hands - she has severe osteoarthritis  in her bilateral hands with CMC subluxation.  She continues to have stiffness and discomfort in her hands.  No synovitis was noted.  I will obtain baseline x-rays today.- Plan: XR Hand 2 View Right, XR Hand 2 View Left.  X-rays were suggestive of osteoarthritis and inflammatory arthritis overlap.  Pes planus of both feet - She had reconstruction surgery in bilateral feet by Dr. Lajoyce Corners in the past.  Primary osteoarthritis of both knees -she complains of pain and discomfort in the bilateral knee joints.  Warmth and swelling was noted in bilateral knee joints.  Patient states she had cortisone injection to her left knee joint by Dr. Denyse Amass in July.  She states she continues to have pain and swelling without a good response to cortisone injection.  Patient has been followed by Dr.Corey and gets cortisone injections as needed.  Pain in both feet -she complains of pain and swelling in her bilateral ankles and her feet.  Her ankles were swollen and warm.  Plan: XR Foot 2 Views Right, XR Foot 2 Views Left.  X-rays were suggestive of inflammatory arthritis and osteoarthritis overlap.  Chronic midline low back pain without sciatica - She has been followed by [facility].  LADA (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), managed as type 1 (HCC)  Malignant neoplasm of right upper lobe of lung (HCC) - diagnosed by Dr. Shirline Frees in 2015.  She was treated with chemotherapy and radiation therapy.  Patient states she did not finish chemotherapy.  We will need clearance from Dr. Arbutus Ped before starting her on immunosuppressive therapy.  Radiation-induced esophageal stricture  History of esophagitis  Neuropathy due to chemotherapeutic drug (HCC)  History of hypertension-blood pressure was normal today at 105/72.  RLS (restless legs syndrome)  History of depression  History of subarachnoid hemorrhage  Aortic atherosclerosis (HCC)  Former smoker - 135-pack-year smoking history  Family history of psoriasis in  maternal grandmother  Orders: Orders Placed This Encounter  Procedures   XR Foot 2 Views Right   XR Foot 2 Views Left   XR Hand 2 View Right   XR Hand 2 View Left   CBC with Differential/Platelet   Sedimentation rate   Hepatitis B core antibody, IgM   Hepatitis B surface antigen   Hepatitis C antibody   QuantiFERON-TB Gold Plus   Serum protein electrophoresis with reflex   IgG, IgA, IgM   No orders of the defined types were placed in this encounter.    Follow-Up Instructions: Return for Psoriatic arthritis.   Pollyann Savoy, MD  Note - This record has been created using Animal nutritionist.  Chart creation errors have been sought, but may not always  have been located. Such creation errors do not reflect on  the standard of medical care.

## 2023-07-12 ENCOUNTER — Ambulatory Visit (INDEPENDENT_AMBULATORY_CARE_PROVIDER_SITE_OTHER): Payer: Medicare PPO

## 2023-07-12 ENCOUNTER — Encounter: Payer: Self-pay | Admitting: Rheumatology

## 2023-07-12 ENCOUNTER — Ambulatory Visit: Payer: Medicare PPO | Attending: Rheumatology | Admitting: Rheumatology

## 2023-07-12 ENCOUNTER — Ambulatory Visit: Payer: Medicare PPO

## 2023-07-12 VITALS — BP 105/72 | HR 120 | Resp 15 | Ht 65.0 in | Wt 172.2 lb

## 2023-07-12 DIAGNOSIS — M19041 Primary osteoarthritis, right hand: Secondary | ICD-10-CM | POA: Diagnosis not present

## 2023-07-12 DIAGNOSIS — Z8659 Personal history of other mental and behavioral disorders: Secondary | ICD-10-CM

## 2023-07-12 DIAGNOSIS — Z8679 Personal history of other diseases of the circulatory system: Secondary | ICD-10-CM

## 2023-07-12 DIAGNOSIS — M170 Bilateral primary osteoarthritis of knee: Secondary | ICD-10-CM | POA: Diagnosis not present

## 2023-07-12 DIAGNOSIS — M79671 Pain in right foot: Secondary | ICD-10-CM

## 2023-07-12 DIAGNOSIS — Z79899 Other long term (current) drug therapy: Secondary | ICD-10-CM | POA: Diagnosis not present

## 2023-07-12 DIAGNOSIS — I700 Atherosclerosis of aorta: Secondary | ICD-10-CM

## 2023-07-12 DIAGNOSIS — L4050 Arthropathic psoriasis, unspecified: Secondary | ICD-10-CM

## 2023-07-12 DIAGNOSIS — M79672 Pain in left foot: Secondary | ICD-10-CM

## 2023-07-12 DIAGNOSIS — G8929 Other chronic pain: Secondary | ICD-10-CM

## 2023-07-12 DIAGNOSIS — M19042 Primary osteoarthritis, left hand: Secondary | ICD-10-CM

## 2023-07-12 DIAGNOSIS — M2141 Flat foot [pes planus] (acquired), right foot: Secondary | ICD-10-CM

## 2023-07-12 DIAGNOSIS — M545 Low back pain, unspecified: Secondary | ICD-10-CM | POA: Diagnosis not present

## 2023-07-12 DIAGNOSIS — C3411 Malignant neoplasm of upper lobe, right bronchus or lung: Secondary | ICD-10-CM

## 2023-07-12 DIAGNOSIS — E139 Other specified diabetes mellitus without complications: Secondary | ICD-10-CM | POA: Diagnosis not present

## 2023-07-12 DIAGNOSIS — G2581 Restless legs syndrome: Secondary | ICD-10-CM

## 2023-07-12 DIAGNOSIS — L409 Psoriasis, unspecified: Secondary | ICD-10-CM

## 2023-07-12 DIAGNOSIS — K222 Esophageal obstruction: Secondary | ICD-10-CM

## 2023-07-12 DIAGNOSIS — Z8719 Personal history of other diseases of the digestive system: Secondary | ICD-10-CM

## 2023-07-12 DIAGNOSIS — Z87891 Personal history of nicotine dependence: Secondary | ICD-10-CM

## 2023-07-12 DIAGNOSIS — T451X5A Adverse effect of antineoplastic and immunosuppressive drugs, initial encounter: Secondary | ICD-10-CM

## 2023-07-12 DIAGNOSIS — Z1159 Encounter for screening for other viral diseases: Secondary | ICD-10-CM | POA: Diagnosis not present

## 2023-07-12 DIAGNOSIS — M2142 Flat foot [pes planus] (acquired), left foot: Secondary | ICD-10-CM

## 2023-07-12 DIAGNOSIS — G620 Drug-induced polyneuropathy: Secondary | ICD-10-CM

## 2023-07-12 DIAGNOSIS — Z840 Family history of diseases of the skin and subcutaneous tissue: Secondary | ICD-10-CM

## 2023-07-12 NOTE — Patient Instructions (Addendum)
Risankizumab Injection What is this medication? RISANKIZUMAB (RIS an KIZ ue mab) treats autoimmune conditions, such as psoriasis, arthritis, and Crohn's disease. It works by slowing down an overactive immune system. It is a monoclonal antibody. This medicine may be used for other purposes; ask your health care provider or pharmacist if you have questions. COMMON BRAND NAME(S): Skyrizi What should I tell my care team before I take this medication? They need to know if you have any of these conditions: Hepatic disease Immune system problems Infection, such as tuberculosis (TB), bacterial, fungal or viral infections Recent or upcoming vaccine An unusual or allergic reaction to risankizumab, other medications, foods, dyes, or preservatives Pregnant or trying to get pregnant Breast-feeding How should I use this medication? This medication is injected into a vein or under the skin. It is given by your care team in a hospital or clinic setting. It may also be given at home. If you get this medication at home, you will be taught how to prepare and give it. Use exactly as directed. Take it as directed on the prescription label. Keep taking it unless your care team tells you to stop. If you use a pen, be sure to take off the outer needle cover before using the dose. It is important that you put your used needles and syringes in a special sharps container. Do not put them in a trash can. If you do not have a sharps container, call your pharmacist or care team to get one. A special MedGuide will be given to you by the pharmacist with each prescription and refill. Be sure to read this information carefully each time. This medication comes with INSTRUCTIONS FOR USE. Ask your pharmacist for directions on how to use this medication. Read the information carefully. Talk to your pharmacist or care team if you have questions. Talk to your care team about the use of this medication in children. Special care may be  needed. Overdosage: If you think you have taken too much of this medicine contact a poison control center or emergency room at once. NOTE: This medicine is only for you. Do not share this medicine with others. What if I miss a dose? It is important not to miss any doses. Talk to your care team about what to do if you miss a dose. What may interact with this medication? Do not take this medication with any of the following: Live vaccines This list may not describe all possible interactions. Give your health care provider a list of all the medicines, herbs, non-prescription drugs, or dietary supplements you use. Also tell them if you smoke, drink alcohol, or use illegal drugs. Some items may interact with your medicine. What should I watch for while using this medication? Visit your care team for regular checks on your progress. Tell your care team if your symptoms do not start to get better or if they get worse. You will be tested for tuberculosis (TB) before you start this medication. If your care team prescribes any medication for TB, you should start taking the TB medication before starting this medication. Make sure to finish the full course of TB medication. This medication may increase your risk of getting an infection. Call your care team for advice if you get a fever, chills, sore throat, or other symptoms of a cold or flu. Do not treat yourself. Try to avoid being around people who are sick. This medication can decrease the response to a vaccine. If you need to get  vaccinated, tell your care team if you have received this medication. Extra booster doses may be needed. Talk to your care team to see if a different vaccination schedule is needed. What side effects may I notice from receiving this medication? Side effects that you should report to your care team as soon as possible: Allergic reactions--skin rash, itching, hives, swelling of the face, lips, tongue, or throat Infection--fever,  chills, cough, sore throat, wounds that don't heal, pain or trouble when passing urine, general feeling of discomfort or being unwell Liver injury--right upper belly pain, loss of appetite, nausea, light-colored stool, dark yellow or brown urine, yellowing skin or eyes, unusual weakness or fatigue Side effects that usually do not require medical attention (report to your care team if they continue or are bothersome): Fatigue Headache Pain, redness, or irritation at injection site Runny or stuffy nose Sore throat This list may not describe all possible side effects. Call your doctor for medical advice about side effects. You may report side effects to FDA at 1-800-FDA-1088. Where should I keep my medication? Keep out of the reach of children and pets. Store in a refrigerator. Do not freeze. Protect from light. Keep it in the original carton until you are ready to take it. See product for storage information. Each product may have different instructions. Remove the dose from the carton about 30 to 45 minutes before it is time for you to take it. Get rid of any unused medication after the expiration date. To get rid of medications that are no longer needed or have expired: Take the medication to a medication take-back program. Check with your pharmacy or law enforcement to find a location. If you cannot return the medication, ask your pharmacist or care team how to get rid of this medication safely. NOTE: This sheet is a summary. It may not cover all possible information. If you have questions about this medicine, talk to your doctor, pharmacist, or health care provider.  2024 Elsevier/Gold Standard (2022-01-05 00:00:00)  Standing Labs We placed an order today for your standing lab work.   Please have your standing labs drawn in 1 month after starting Skyrizi and then every 3 months.  Please have your labs drawn 2 weeks prior to your appointment so that the provider can discuss your lab results  at your appointment, if possible.  Please note that you may see your imaging and lab results in MyChart before we have reviewed them. We will contact you once all results are reviewed. Please allow our office up to 72 hours to thoroughly review all of the results before contacting the office for clarification of your results.  WALK-IN LAB HOURS  Monday through Thursday from 8:00 am -12:30 pm and 1:00 pm-5:00 pm and Friday from 8:00 am-12:00 pm.  Patients with office visits requiring labs will be seen before walk-in labs.  You may encounter longer than normal wait times. Please allow additional time. Wait times may be shorter on  Monday and Thursday afternoons.  We do not book appointments for walk-in labs. We appreciate your patience and understanding with our staff.   Labs are drawn by [facility]. Please bring your co-pay at the time of your lab draw.  You may receive a bill from [facility] for your lab work.  Please note if you are on Hydroxychloroquine and and an order has been placed for a Hydroxychloroquine level,  you will need to have it drawn 4 hours or more after your last dose.  If you  wish to have your labs drawn at another location, please call the office 24 hours in advance so we can fax the orders.  The office is located at [address], [address], [place], [place] [address]   If you have any questions regarding directions or hours of operation,  please call [phone].   As a reminder, please drink plenty of water prior to coming for your lab work. Thanks!   Vaccines You are taking a medication(s) that can suppress your immune system.  The following immunizations are recommended: Flu annually Covid-19  RSV Td/Tdap (tetanus, diphtheria, pertussis) every 10 years Pneumonia (Prevnar 15 then Pneumovax 23 at least 1 year apart.  Alternatively, can take Prevnar 20 without needing additional dose) Shingrix: 2 doses from 4 weeks to 6 months apart  Please check with your PCP to  make sure you are up to date.   If you have signs or symptoms of an infection or start antibiotics: First, call your PCP for workup of your infection. Hold your medication through the infection, until you complete your antibiotics, and until symptoms resolve if you take the following: Injectable medication (Actemra, Benlysta, Cimzia, Cosentyx, Enbrel, Humira, Kevzara, Orencia, Remicade, Simponi, Stelara, Taltz, Tremfya) Methotrexate Leflunomide (Arava) Mycophenolate (Cellcept) Harriette Ohara, Olumiant, or Rinvoq

## 2023-07-13 ENCOUNTER — Telehealth: Payer: Self-pay | Admitting: Pharmacist

## 2023-07-13 DIAGNOSIS — L409 Psoriasis, unspecified: Secondary | ICD-10-CM

## 2023-07-13 DIAGNOSIS — L4050 Arthropathic psoriasis, unspecified: Secondary | ICD-10-CM

## 2023-07-13 DIAGNOSIS — Z79899 Other long term (current) drug therapy: Secondary | ICD-10-CM

## 2023-07-13 NOTE — Telephone Encounter (Signed)
Pending clearance from oncology (Dr. Shirline Frees), patient will be new start to [facility] SQ.  Dose: 150 mg at Week 0, Week 4, then every 12 weeks.  Consent obtained and sent to the scan center. Patient took home Abbvie PAP form for herself. Provider form placed in Dr. Fatima Sanger folder for signature  Chesley Mires, PharmD, MPH, BCPS, CPP Clinical Pharmacist (Rheumatology and Pulmonology)

## 2023-07-15 LAB — CBC WITH DIFFERENTIAL/PLATELET
Absolute Monocytes: 869 {cells}/uL (ref 200–950)
Basophils Absolute: 127 {cells}/uL (ref 0–200)
Basophils Relative: 1.2 %
Eosinophils Absolute: 509 {cells}/uL — ABNORMAL HIGH (ref 15–500)
Eosinophils Relative: 4.8 %
HCT: 44.3 % (ref 35.0–45.0)
Hemoglobin: 14.3 g/dL (ref 11.7–15.5)
Lymphs Abs: 1887 {cells}/uL (ref 850–3900)
MCH: 28.1 pg (ref 27.0–33.0)
MCHC: 32.3 g/dL (ref 32.0–36.0)
MCV: 87 fL (ref 80.0–100.0)
MPV: 9.6 fL (ref 7.5–12.5)
Monocytes Relative: 8.2 %
Neutro Abs: 7208 {cells}/uL (ref 1500–7800)
Neutrophils Relative %: 68 %
Platelets: 260 10*3/uL (ref 140–400)
RBC: 5.09 10*6/uL (ref 3.80–5.10)
RDW: 14.2 % (ref 11.0–15.0)
Total Lymphocyte: 17.8 %
WBC: 10.6 10*3/uL (ref 3.8–10.8)

## 2023-07-15 LAB — PROTEIN ELECTROPHORESIS, SERUM, WITH REFLEX
Albumin ELP: 4 g/dL (ref 3.8–4.8)
Alpha 1: 0.3 g/dL (ref 0.2–0.3)
Alpha 2: 0.8 g/dL (ref 0.5–0.9)
Beta 2: 0.4 g/dL (ref 0.2–0.5)
Beta Globulin: 0.5 g/dL (ref 0.4–0.6)
Gamma Globulin: 0.8 g/dL (ref 0.8–1.7)
Total Protein: 6.8 g/dL (ref 6.1–8.1)

## 2023-07-15 LAB — IGG, IGA, IGM
IgG (Immunoglobin G), Serum: 970 mg/dL (ref 600–1540)
IgM, Serum: 79 mg/dL (ref 50–300)
Immunoglobulin A: 286 mg/dL (ref 70–320)

## 2023-07-15 LAB — SEDIMENTATION RATE: Sed Rate: 17 mm/h (ref 0–30)

## 2023-07-15 LAB — QUANTIFERON-TB GOLD PLUS
Mitogen-NIL: 6.47 [IU]/mL
NIL: 0.04 [IU]/mL
QuantiFERON-TB Gold Plus: NEGATIVE
TB1-NIL: <0 [IU]/mL
TB2-NIL: <0 [IU]/mL

## 2023-07-15 LAB — HEPATITIS B CORE ANTIBODY, IGM: Hep B C IgM: NONREACTIVE

## 2023-07-15 LAB — HEPATITIS B SURFACE ANTIGEN: Hepatitis B Surface Ag: NONREACTIVE

## 2023-07-15 LAB — HEPATITIS C ANTIBODY: Hepatitis C Ab: NONREACTIVE

## 2023-07-16 NOTE — Progress Notes (Signed)
CBC, sed rate, immunoglobulins, hepatitis B hepatitis C, TB Gold, SPEP and immunoglobulins are within normal limits. I received a response from Dr. Arbutus Ped.  According to him her lung cancer has been under control for several years with no evidence of recurrence. He mentioned discussing risk of malignancy with specific immunosuppressive agent with the patient.  Megan [name] has known malignancy warning. Devki, would you please discuss Dr. Asa Lente recommendation with [name] and we can apply for [facility] if she is in agreement.

## 2023-07-17 ENCOUNTER — Encounter: Payer: Self-pay | Admitting: Pharmacist

## 2023-07-17 NOTE — Telephone Encounter (Signed)
Submitted a Prior Authorization request to [facility] for SKYRIZI SQ via CoverMyMeds. Will update once we receive a response.  Key: [id number]

## 2023-07-17 NOTE — Telephone Encounter (Signed)
ATC patient to discuss that Dr. Arbutus Ped ok with her starting Cristy Folks as long as pt aware of risk for cancers with any immunosuppressive agent. Left VM for pt requesting return call  Chesley Mires, PharmD, MPH, BCPS, CPP Clinical Pharmacist (Rheumatology and Pulmonology)

## 2023-07-17 NOTE — Telephone Encounter (Signed)
Spoke with patient. Discussed the risk for immunosuppressive agents and possible malignancy risk generally. There is nothing specific listed in prescribing information for Cristy Folks but there is theoretical risk. Patient verbalized understanding and agreeable to moving forward with Cristy Folks  OK to start [employer] [facility]  Chesley Mires, PharmD, MPH, BCPS, CPP Clinical Pharmacist (Rheumatology and Pulmonology)

## 2023-07-19 ENCOUNTER — Other Ambulatory Visit (HOSPITAL_COMMUNITY): Payer: Self-pay

## 2023-07-19 ENCOUNTER — Other Ambulatory Visit: Payer: Self-pay

## 2023-07-19 MED ORDER — SKYRIZI PEN 150 MG/ML ~~LOC~~ SOAJ
SUBCUTANEOUS | 0 refills | Status: DC
Start: 2023-07-19 — End: 2023-07-25
  Filled 2023-07-19: qty 1, fill #0
  Filled 2023-07-20: qty 1, 28d supply, fill #0

## 2023-07-19 MED ORDER — SKYRIZI PEN 150 MG/ML ~~LOC~~ SOAJ
SUBCUTANEOUS | 0 refills | Status: DC
Start: 2023-07-19 — End: 2023-07-19

## 2023-07-19 NOTE — Telephone Encounter (Signed)
Received notification from [facility] regarding a prior authorization for SKYRIZI SQ. Authorization has been APPROVED from 07/17/2023 to 10/31/2023. Approval letter sent to scan center.  Per test claim, copay for 28 days supply is $0  Patient can fill through [facility]: [phone]   Authorization # [id number] Phone # [phone]  Patient scheduled for Skyrizi new start on 07/25/2023. Pt denies signs/symptoms of infection and active abx use, Routing to [facility] for onboarding  Chesley Mires, PharmD, MPH, BCPS, CPP Clinical Pharmacist (Rheumatology and Pulmonology)

## 2023-07-20 ENCOUNTER — Other Ambulatory Visit (HOSPITAL_COMMUNITY): Payer: Self-pay

## 2023-07-20 ENCOUNTER — Other Ambulatory Visit: Payer: Self-pay

## 2023-07-20 NOTE — Progress Notes (Signed)
Pharmacy Note  Subjective:   Patient presents to clinic today to receive first dose of Skyrizi for PsA + PsO. Patient currently takes [place]. Remicade trial led to anaphylaxis.   Patient running a fever or have signs/symptoms of infection? No  Patient currently on antibiotics for the treatment of infection? No  Patient have any upcoming invasive procedures/surgeries? No  Objective: CMP     Component Value Date/Time   NA 141 06/14/2023 1345   NA 139 07/10/2019 0922   NA 141 09/08/2017 1213   K 3.4 (L) 06/14/2023 1345   K 4.1 09/08/2017 1213   CL 101 06/14/2023 1345   CO2 32 06/14/2023 1345   CO2 28 09/08/2017 1213   GLUCOSE 80 06/14/2023 1345   GLUCOSE 182 (H) 09/08/2017 1213   BUN 17 06/14/2023 1345   BUN 18 07/10/2019 0922   BUN 13.8 09/08/2017 1213   CREATININE 0.94 06/14/2023 1345   CREATININE 0.99 02/10/2023 1330   CREATININE 0.9 09/08/2017 1213   CALCIUM 10.3 06/14/2023 1345   CALCIUM 9.4 09/08/2017 1213   PROT 6.8 07/12/2023 1348   PROT 7.1 09/08/2017 1213   ALBUMIN 4.1 06/14/2023 1345   ALBUMIN 3.8 09/08/2017 1213   AST 18 06/14/2023 1345   AST 16 02/10/2023 1330   AST 15 09/08/2017 1213   ALT 15 06/14/2023 1345   ALT 12 02/10/2023 1330   ALT 17 09/08/2017 1213   ALKPHOS 111 06/14/2023 1345   ALKPHOS 182 (H) 09/08/2017 1213   BILITOT 0.5 06/14/2023 1345   BILITOT 0.5 02/10/2023 1330   BILITOT 0.45 09/08/2017 1213   GFRNONAA 57 (L) 02/10/2023 1330   GFRAA 56 (L) 02/03/2020 1110    CBC    Component Value Date/Time   WBC 10.6 07/12/2023 1348   RBC 5.09 07/12/2023 1348   HGB 14.3 07/12/2023 1348   HGB 13.6 02/10/2023 1330   HGB 13.8 09/08/2017 1213   HCT 44.3 07/12/2023 1348   HCT 43.0 09/08/2017 1213   PLT 260 07/12/2023 1348   PLT 228 02/10/2023 1330   PLT 155 09/08/2017 1213   MCV 87.0 07/12/2023 1348   MCV 90.1 09/08/2017 1213   MCH 28.1 07/12/2023 1348   MCHC 32.3 07/12/2023 1348   RDW 14.2 07/12/2023 1348   RDW 16.8 (H) 09/08/2017 1213    LYMPHSABS 1,887 07/12/2023 1348   LYMPHSABS 1.3 09/08/2017 1213   MONOABS 0.8 06/14/2023 1345   MONOABS 0.6 09/08/2017 1213   EOSABS 509 (H) 07/12/2023 1348   EOSABS 0.3 09/08/2017 1213   BASOSABS 127 07/12/2023 1348   BASOSABS 0.1 09/08/2017 1213    Baseline Immunosuppressant Therapy Labs TB GOLD    Latest Ref Rng & Units 07/12/2023    1:48 PM  Quantiferon TB Gold  Quantiferon TB Gold Plus NEGATIVE NEGATIVE    Hepatitis Panel    Latest Ref Rng & Units 07/12/2023    1:48 PM  Hepatitis  Hep B Surface Ag NON-REACTIVE NON-REACTIVE   Hep B IgM NON-REACTIVE NON-REACTIVE   Hep C Ab NON-REACTIVE NON-REACTIVE    HIV No results found for: "HIV" Immunoglobulins    Latest Ref Rng & Units 07/12/2023    1:48 PM  Immunoglobulin Electrophoresis  IgA  70 - 320 mg/dL 604   IgG 540 - 9,811 mg/dL 914   IgM 50 - 782 mg/dL 79    SPEP    Latest Ref Rng & Units 07/12/2023    1:48 PM  Serum Protein Electrophoresis  Total Protein 6.1 - 8.1 g/dL 6.8  Albumin 3.8 - 4.8 g/dL 4.0   Alpha-1 0.2 - 0.3 g/dL 0.3   Alpha-2 0.5 - 0.9 g/dL 0.8   Beta Globulin 0.4 - 0.6 g/dL 0.5   Beta 2 0.2 - 0.5 g/dL 0.4   Gamma Globulin 0.8 - 1.7 g/dL 0.8   Interpretation  --    G6PD Lab Results  Component Value Date   G6PDH 13.0 09/14/2016   TPMT No results found for: "TPMT"   02/10/2023 Chest x-ray Impression: 1. Stable surgical changes from right upper lobe lobectomy and right middle lobe collapse. No findings suspicious for recurrent tumor. 2. Stable soft tissue density in the subcarinal region possibly a combination of thickening of the esophagus and treated tumor/radiation changes. No new or progressive findings in this area. 3. No new pulmonary lesions or pulmonary nodules to suggest metastatic disease.  Assessment/Plan:  Reviewed importance of holding Skyrizi with signs/symptoms of an infections, if antibiotics are prescribed to treat an active infection, and with invasive  procedures  Demonstrated proper injection technique with Skyrizi demo device  Patient able to demonstrate proper injection technique using the teach back method.  Patient self injected in the right lower abdomen with:  Prescription Medication: Skyrizi 150mg /mL pen injector NDC: [id number]  Lot: [id number] Expiration: 09/29/2024  Patient tolerated well.  Observed for 30 mins in office for adverse reaction. Patient denies itchiness and irritation at injection., No swelling or redness noted., and Reviewed injection site reaction management with patient verbally and printed information for review in AVS  Patient is to return in 1 month for labs and 6-8 weeks for follow-up appointment.  Standing orders for CBC/CMP placed.  TB gold will be monitored yearly.  Patient states she has found a dermatologist at [facility] to follow for yearly skin checks while on TNF inhibitor due to risk for non melanoma skin cancer. She follows an oncologist for her history of lung cancer.   Skyrizi approved through Community education officer .   Rx sent to: [facility]: [phone] .  Patient provided with pharmacy phone number and advised that they will call to schedule shipments to home.  Patient will continue Skyrizi 150mg  subcut at Week 0 (administered in clinic today), Week 4, then every 12 weeks.  All questions encouraged and answered.  Instructed patient to call with any further questions or concerns.  Chesley Mires, PharmD, MPH, BCPS, CPP Clinical Pharmacist (Rheumatology and Pulmonology)  Sofie Rower, PharmD, [facility] PGY1  07/20/2023 4:23 PM

## 2023-07-20 NOTE — Telephone Encounter (Signed)
Delivery instructions have been updated in [place], medication will be couriered to [facility] on 07/24/23.  Rx has been processed in [facility] and the patient has no copay at this time.

## 2023-07-24 NOTE — Telephone Encounter (Signed)
Skyrizi received from [facility]. Placed in fridge

## 2023-07-25 ENCOUNTER — Other Ambulatory Visit (HOSPITAL_COMMUNITY): Payer: Self-pay

## 2023-07-25 ENCOUNTER — Other Ambulatory Visit: Payer: Self-pay

## 2023-07-25 ENCOUNTER — Ambulatory Visit: Payer: Medicare PPO | Attending: Rheumatology | Admitting: Pharmacist

## 2023-07-25 ENCOUNTER — Other Ambulatory Visit: Payer: Self-pay | Admitting: Pharmacist

## 2023-07-25 DIAGNOSIS — L409 Psoriasis, unspecified: Secondary | ICD-10-CM

## 2023-07-25 DIAGNOSIS — L4050 Arthropathic psoriasis, unspecified: Secondary | ICD-10-CM

## 2023-07-25 DIAGNOSIS — Z79899 Other long term (current) drug therapy: Secondary | ICD-10-CM

## 2023-07-25 MED ORDER — SKYRIZI PEN 150 MG/ML ~~LOC~~ SOAJ
SUBCUTANEOUS | 0 refills | Status: DC
  Filled 2023-07-25: qty 1, fill #0
  Filled 2023-08-15: qty 1, 84d supply, fill #0

## 2023-07-25 NOTE — Patient Instructions (Addendum)
Your next SKYRIZI SQ dose is due on 08/22/2023, 11/14/2023, and every 12 weeks thereafter  HOLD SKYRIZI SQ if you have signs or symptoms of an infection. You can resume once you feel better or back to your baseline. HOLD SKYRIZI SQ if you start antibiotics to treat an infection. HOLD SKYRIZI SQ around the time of surgery/procedures. Your surgeon will be able to provide recommendations on when to hold BEFORE and when you are cleared to RESUME.  Pharmacy information: Your prescription will be shipped from [facility]. Their phone number is [phone] Please call to schedule shipment and confirm address. They will mail your medication to your home.  Cost information: Your copay should be affordable. If you call the pharmacy and it is not affordable, please double-check that they are billing through your copay card as secondary coverage.  Labs are due in 1 month then every 3 months. Lab hours are from Monday to Thursday 8am-12:30pm and 1pm-5pm and Friday 8am-12pm. You do not need an appointment if you come for labs during these times. If you'd like to go to a [facility] or [facility] closer to home, please call our clinic 48 hours prior to lab date so we can release orders in a timely manner.  How to manage an injection site reaction: Remember the 5 C's: COUNTER - leave on the counter at least 30 minutes but up to overnight to bring medication to room temperature. This may help prevent stinging COLD - place something cold (like an ice gel pack or cold water bottle) on the injection site just before cleansing with alcohol. This may help reduce pain CLARITIN - use Claritin (generic name is loratadine) for the first two weeks of treatment or the day of, the day before, and the day after injecting. This will help to minimize injection site reactions CORTISONE CREAM - apply if injection site is irritated and itching CALL ME - if injection site reaction is bigger than the size of your fist,  looks infected, blisters, or if you develop hives

## 2023-07-25 NOTE — Telephone Encounter (Signed)
Patient dropped of patient assistance application for Abbvie which may need to be submitted in Jan if copay increases and is unaffordable for pt  Megan [name], PharmD, MPH, BCPS, CPP Clinical Pharmacist (Rheumatology and Pulmonology)

## 2023-07-25 NOTE — Addendum Note (Signed)
Addended by: Gwenlyn Found on: 07/25/2023 11:31 AM   Modules accepted: Orders

## 2023-07-27 DIAGNOSIS — E1140 Type 2 diabetes mellitus with diabetic neuropathy, unspecified: Secondary | ICD-10-CM | POA: Diagnosis not present

## 2023-08-02 DIAGNOSIS — L409 Psoriasis, unspecified: Secondary | ICD-10-CM | POA: Diagnosis not present

## 2023-08-02 DIAGNOSIS — L400 Psoriasis vulgaris: Secondary | ICD-10-CM | POA: Diagnosis not present

## 2023-08-10 ENCOUNTER — Other Ambulatory Visit: Payer: Self-pay

## 2023-08-10 ENCOUNTER — Telehealth: Payer: Self-pay | Admitting: Pharmacist

## 2023-08-10 ENCOUNTER — Other Ambulatory Visit (HOSPITAL_COMMUNITY): Payer: Self-pay

## 2023-08-10 NOTE — Telephone Encounter (Signed)
Megan [name] called regarding a Skyrizi injection. [facility] pharmacy called her stating the injection is due every 4 weeks and her next would be approximately 10/9. She requested a call back for clarity and can be reached at [phone].

## 2023-08-10 NOTE — Telephone Encounter (Signed)
Spoke with patient regarding her Skyrizi refill. Pt originally thought her first dose was given on 07/12/23. However, confirmed with [facility], first dose and injection training was given on 07/25/23. Her next dose (week 4) is due on Tuesday, 10/22. Called patient and left a HIPAA compliant voicemail confirming Skyrizi delivery for next week and injection due date pending renewal prescription from clinic.

## 2023-08-10 NOTE — Telephone Encounter (Signed)
Spoke with patient and advised her next injection is due on 08/22/2023 and then every 12 weeks after that. Patient asked when she was suppose to come to the office that day. Advised patient she will not receive her next injection at our office. Patient advised she will need to contact the pharmacy so her medication can be delivered. Patient expressed understanding.

## 2023-08-14 ENCOUNTER — Other Ambulatory Visit: Payer: Self-pay

## 2023-08-15 ENCOUNTER — Other Ambulatory Visit: Payer: Self-pay

## 2023-08-15 NOTE — Progress Notes (Signed)
Specialty Pharmacy Refill Coordination Note  Megan [name] is a 82 y.o. female contacted today regarding refills of specialty medication(s) Risankizumab-Rzaa (Antipsoriatics)   Patient requested Delivery   Delivery date: 08/15/23   Verified address: [address]   Medication will be filled on 08/15/23.

## 2023-08-17 ENCOUNTER — Other Ambulatory Visit (HOSPITAL_BASED_OUTPATIENT_CLINIC_OR_DEPARTMENT_OTHER): Payer: Self-pay

## 2023-08-17 MED ORDER — COMIRNATY 30 MCG/0.3ML IM SUSY
0.3000 mL | PREFILLED_SYRINGE | Freq: Once | INTRAMUSCULAR | 0 refills | Status: AC
  Filled 2023-08-17: qty 0.3, 1d supply, fill #0

## 2023-08-30 NOTE — Progress Notes (Unsigned)
Office Visit Note  Patient: Megan [name]             Date of Birth: [birth year]           MRN: [id number]             PCP: Pincus Sanes, MD Referring: Pincus Sanes, MD Visit Date: 09/13/2023 Occupation: @GUAROCC @  Subjective:  Medication monitoring   History of Present Illness: Megan [name] is a 82 y.o. female with history of psoriatic arthritis and osteoarthritis.  Patient has been initiated on Skyrizi due to an inadequate response to [employer]. The first dose of skyrizi was administered in the office on 07/25/23.  According to the patient she has had a total of 3 injections so far.  She states that she had 2 injections while in the office and then has had 1 shot at home.  Discussed that Cristy Folks is approved for injection at weeks 0, week 4, then every 12 weeks.  Patient was sent a total of 1 injection of Skyrizi by [employer].  She is unsure of the exact date of her last Skyrizi injection. Patient states that she has noticed about 10% improvement in her psoriasis since initiating Skyrizi.  Patient continues to experience intermittent arthralgias and joint stiffness.    Activities of Daily Living:  Patient reports morning stiffness for all day. Patient Reports nocturnal pain.  Difficulty dressing/grooming: Denies Difficulty climbing stairs: Denies Difficulty getting out of chair: Denies Difficulty using hands for taps, buttons, cutlery, and/or writing: Reports  Review of Systems  Constitutional:  Positive for fatigue.  HENT:  Negative for mouth sores and mouth dryness.   Eyes:  Negative for dryness.  Respiratory:  Positive for cough, shortness of breath and wheezing.   Cardiovascular:  Negative for chest pain and palpitations.  Gastrointestinal:  Negative for blood in stool, constipation and diarrhea.  Endocrine: Positive for increased urination.  Genitourinary:  Positive for involuntary urination.  Musculoskeletal:  Positive for joint pain, gait problem,  joint pain, joint swelling, muscle weakness and morning stiffness. Negative for myalgias, muscle tenderness and myalgias.  Skin:  Positive for rash and hair loss. Negative for color change and sensitivity to sunlight.  Allergic/Immunologic: Negative for susceptible to infections.  Neurological:  Negative for dizziness and headaches.  Hematological:  Negative for swollen glands.  Psychiatric/Behavioral:  Positive for sleep disturbance. Negative for depressed mood. The patient is not nervous/anxious.     PMFS History:  Patient Active Problem List   Diagnosis Date Noted   Vitamin B12 deficiency 06/14/2023   Asthma 06/14/2023   Subclinical hypothyroidism 06/13/2023   Primary osteoarthritis of left knee 04/10/2023   Keratosis 07/15/2022   Onychomycosis of nail of digit of hand 03/03/2022   Nocturia 11/24/2021   OAB (overactive bladder) 11/24/2021   Urge incontinence 11/24/2021   Urinary urgency 11/24/2021   Vaginal atrophy 11/24/2021   Vitamin D deficiency 10/12/2021   Mixed hyperlipidemia 10/12/2021   Frequent UTI 10/12/2021   Aortic atherosclerosis (HCC) 10/09/2020   Fall 05/04/2020   Head pain 05/10/2019   GERD (gastroesophageal reflux disease) 10/17/2018   Herpes zoster without complication 03/01/2018   Right elbow pain 03/01/2018   Carpal tunnel syndrome on right 11/30/2017   Cough 08/22/2017   Primary osteoarthritis of both hands 03/08/2017   Poor balance 02/07/2017   Subarachnoid bleed (HCC) 01/11/2017   Psoriatic arthropathy (HCC) 09/08/2016   Hypertension 09/08/2016   Diabetic neuropathy (HCC) 09/08/2016   LADA (latent autoimmune diabetes in  adults), managed as type 1 (HCC) 05/04/2016   Syncope 03/25/2016   RLS (restless legs syndrome) 12/11/2015   Lumbago 05/06/2015   Peripheral edema 04/14/2015   Psoriasis 04/14/2015   Emphysema of lung (HCC) 11/26/2014   Radiation-induced esophageal stricture 10/01/2014   DNR (do not resuscitate) discussion 08/21/2014   Fatigue  07/18/2014   Neuropathy due to chemotherapeutic drug (HCC) 07/18/2014   Muscle cramping 07/18/2014   SOB (shortness of breath) 04/28/2014   Radiation esophagitis 04/10/2014   Cancer of middle lobe of lung (HCC) 01/17/2014   Malignant neoplasm of right upper lobe of lung (HCC) 11/28/2013    Past Medical History:  Diagnosis Date   Cancer (HCC) dx'd 09/2013   Lung ca   Concussion 2018   Diabetes (HCC) 09/08/2016   type I diabetic patient reported on 09/13/16    Diabetes mellitus    insulin   Diabetic neuropathy (HCC) 09/08/2016   Fall 2018   GERD (gastroesophageal reflux disease)    no meds for   History of colitis    History of hiatal hernia    Hx of radiation therapy 12/16/13-01/30/14   lung 66Gy   Hypertension    Malignant neoplasm of right upper lobe of lung (HCC) 11/28/2013   Neuropathy    Psoriasis    Psoriatic arthritis (HCC) 09/08/2016   Rheumatoid arthritis (HCC)    Shortness of breath dyspnea    with exertion    Family History  Problem Relation Age of Onset   Other Mother    Anuerysm Mother        brain   Other Father        failure to thrive   Hypertension Other    Stroke Other    Heart attack Other    Hemachromatosis Other        great grandfather   Rheum arthritis Other        both sets of grandparents and father   Migraines Neg Hx    Colon cancer Neg Hx    Colon polyps Neg Hx    Esophageal cancer Neg Hx    Pancreatic cancer Neg Hx    Stomach cancer Neg Hx    Liver disease Neg Hx    Past Surgical History:  Procedure Laterality Date   ABDOMINAL HYSTERECTOMY     ABDOMINAL SURGERY     BALLOON DILATION N/A 12/12/2014   Procedure: BALLOON DILATION;  Surgeon: Louis Meckel, MD;  Location: [facility];  Service: Endoscopy;  Laterality: N/A;   BIOPSY  01/20/2020   Procedure: BIOPSY;  Surgeon: Sherrilyn Rist, MD;  Location: [facility];  Service: Gastroenterology;;   CHOLECYSTECTOMY     COLONOSCOPY N/A 02/07/2016   Procedure: COLONOSCOPY;  Surgeon:  Sherrilyn Rist, MD;  Location: [facility] ENDOSCOPY;  Service: Endoscopy;  Laterality: N/A;   DILATION AND CURETTAGE OF UTERUS     ESOPHAGOGASTRODUODENOSCOPY N/A 12/12/2014   Procedure: ESOPHAGOGASTRODUODENOSCOPY (EGD);  Surgeon: Louis Meckel, MD;  Location: [facility];  Service: Endoscopy;  Laterality: N/A;  with dilation   ESOPHAGOGASTRODUODENOSCOPY (EGD) WITH PROPOFOL N/A 10/09/2014   Procedure: ESOPHAGOGASTRODUODENOSCOPY (EGD) WITH PROPOFOL;  Surgeon: Louis Meckel, MD;  Location: [facility] ENDOSCOPY;  Service: Endoscopy;  Laterality: N/A;   ESOPHAGOGASTRODUODENOSCOPY (EGD) WITH PROPOFOL N/A 09/19/2016   Procedure: ESOPHAGOGASTRODUODENOSCOPY (EGD) WITH PROPOFOL;  Surgeon: Sherrilyn Rist, MD;  Location: [facility] ENDOSCOPY;  Service: Gastroenterology;  Laterality: N/A;  with savary dil tt   ESOPHAGOGASTRODUODENOSCOPY (EGD) WITH PROPOFOL N/A 01/20/2020  Procedure: ESOPHAGOGASTRODUODENOSCOPY (EGD) WITH PROPOFOL;  Surgeon: Sherrilyn Rist, MD;  Location: [facility] ENDOSCOPY;  Service: Gastroenterology;  Laterality: N/A;  Fluoro-Savary   ORIF ANKLE FRACTURE Right 07/04/2015   Procedure: OPEN REDUCTION INTERNAL FIXATION (ORIF) ANKLE FRACTURE;  Surgeon: Nadara Mustard, MD;  Location: [facility] OR;  Service: Orthopedics;  Laterality: Right;  OPEN REDUCTION, INTERNAL FIXATION OF RIGHT ANKLE FRACTURE.    ORIF ANKLE FRACTURE Left 02/10/2016   Procedure: OPEN REDUCTION INTERNAL FIXATION (ORIF) ANKLE FRACTURE;  Surgeon: Nadara Mustard, MD;  Location: [facility] OR;  Service: Orthopedics;  Laterality: Left;   SAVORY DILATION N/A 10/09/2014   Procedure: SAVORY DILATION;  Surgeon: Louis Meckel, MD;  Location: [facility] ENDOSCOPY;  Service: Endoscopy;  Laterality: N/A;   SAVORY DILATION N/A 09/19/2016   Procedure: SAVORY DILATION;  Surgeon: Sherrilyn Rist, MD;  Location: [facility] ENDOS[facility];  Service: Gastroenterology;  Laterality: N/A;   SAVORY DILATION N/A 01/20/2020   Procedure: SAVORY DILATION;  Surgeon: Sherrilyn Rist, MD;  Location: [facility]  ENDOSCOPY;  Service: Gastroenterology;  Laterality: N/A;   TONSILLECTOMY     Social History   Social History Narrative   Patient lives at home. She has an old friend who is her caregiver, Madelaine Bhat, who stays with her 24/7.   Caffeine Use: 1 cups daily   Right handed   Immunization History  Administered Date(s) Administered   Fluad Quad(high Dose 65+) 07/31/2019, 10/01/2020, 07/15/2022   Influenza, High Dose Seasonal PF 08/12/2016, 08/30/2017, 09/07/2018, 08/24/2021   Influenza,inj,Quad PF,6+ Mos 11/07/2013, 07/14/2014, 08/25/2015   Influenza-Unspecified 08/07/2018, 09/07/2018   PFIZER(Purple Top)SARS-COV-2 Vaccination 11/28/2019, 12/25/2019, 06/18/2020   PPD Test 03/02/2016   Pfizer Covid-19 Vaccine Bivalent Booster 86yrs & up 07/27/2021   Pfizer(Comirnaty)Fall Seasonal Vaccine 12 years and older 08/17/2023   Pneumococcal Conjugate-13 05/10/2019   Pneumococcal Polysaccharide-23 03/19/2018   Pneumococcal-Unspecified 12/02/2015   Tdap 01/11/2017   Zoster Recombinant(Shingrix) 02/14/2018, 08/07/2018     Objective: Vital Signs: BP 115/80 (BP Location: Left Arm, Patient Position: Sitting, Cuff Size: Normal)   Pulse (!) 103   Resp 17   Ht 5\' 5"  (1.651 m)   Wt 179 lb (81.2 kg)   BMI 29.79 kg/m    Physical Exam Vitals and nursing note reviewed.  Constitutional:      Appearance: She is well-developed.  HENT:     Head: Normocephalic and atraumatic.  Eyes:     Conjunctiva/sclera: Conjunctivae normal.  Cardiovascular:     Rate and Rhythm: Normal rate and regular rhythm.     Heart sounds: Normal heart sounds.  Pulmonary:     Effort: Pulmonary effort is normal.     Breath sounds: Normal breath sounds.  Abdominal:     General: Bowel sounds are normal.     Palpations: Abdomen is soft.  Musculoskeletal:     Cervical back: Normal range of motion.  Lymphadenopathy:     Cervical: No cervical adenopathy.  Skin:    General: Skin is warm and dry.     Capillary Refill:  Capillary refill takes less than 2 seconds.  Neurological:     Mental Status: She is alert and oriented to person, place, and time.  Psychiatric:        Behavior: Behavior normal.      Musculoskeletal Exam: C-spine has good range of motion.  Thoracic kyphosis noted.  No midline spinal tenderness.  No SI joint tenderness.  Shoulder joints, elbow joints, wrist joints, MCPs, PIPs, DIPs have good range of motion with no  synovitis.  CMC, PIP, DIP thickening consistent with osteoarthritis of both hands.  Hip joints have slightly limited ROM.  Knee joints have good ROM with no warmth or effusion.  Ankle joints have good ROM with some warmth bilaterally.  Pes planus noted bilaterally.   CDAI Exam: CDAI Score: -- Patient Global: --; Provider Global: -- Swollen: --; Tender: -- Joint Exam 09/13/2023   No joint exam has been documented for this visit   There is currently no information documented on the homunculus. Go to the Rheumatology activity and complete the homunculus joint exam.  Investigation: No additional findings.  Imaging: No results found.  Recent Labs: Lab Results  Component Value Date   WBC 10.6 07/12/2023   HGB 14.3 07/12/2023   PLT 260 07/12/2023   NA 141 06/14/2023   K 3.4 (L) 06/14/2023   CL 101 06/14/2023   CO2 32 06/14/2023   GLUCOSE 80 06/14/2023   BUN 17 06/14/2023   CREATININE 0.94 06/14/2023   BILITOT 0.5 06/14/2023   ALKPHOS 111 06/14/2023   AST 18 06/14/2023   ALT 15 06/14/2023   PROT 6.8 07/12/2023   ALBUMIN 4.1 06/14/2023   CALCIUM 10.3 06/14/2023   GFRAA 56 (L) 02/03/2020   QFTBGOLDPLUS NEGATIVE 07/12/2023    Speciality Comments: Remicade-anaphylaxis  Procedures:  No procedures performed Allergies: Carboplatin, Remicade [infliximab], Other, Rifampin, and Hydromorphone     Assessment / Plan:     Visit Diagnoses: Psoriatic arthropathy (HCC): Patient was initially diagnosed with psoriasis and psoriatic arthritis by Dr. Phylliss Bob in 2004.  She was  initially treated with IV Remicade and then was switched to Humira in 2010 after an anaphylactic reaction to IV Remicade.  Humira was discontinued in 2015 after patient developed lung cancer.  She underwent chemotherapy and radiation therapy.  She was on sulfasalazine until 2018 intermittently.  She had been on Otezla by Dr. Doreen Beam since 2018 but had an inadequate response. Patient was initiated on Skyrizi on 07/25/2023.  She has tolerated Skyrizi without any side effects or injection site reactions.  She has noticed about a 10% improvement in her psoriasis since initiating therapy.  She has started to notice a gradual improvement in her joint pain but continues to have intermittent arthralgias and joint stiffness.  Patient is willing to give Cristy Folks more time and for [place] to reassess for the full efficacy in 3 months.  Psoriasis: Extensive psoriasis on scalp, extremities, and torso.  Patient was initiated on Skyrizi on 07/25/2023.  She has noticed about a 10% improvement in skin clearance.  She will remain on Skyrizi as prescribed.  High risk medication use - Skyrizi 150 mg sq injections every 12 weeks Cristy Folks was initiated on 07/25/23.   Previous therapy: Otezla, remicade, and humira  Baseline immunosuppressive lab work negative on 07/12/23.  CBC updated on 07/12/23. BMP updated on 06/14/23.  Orders for CBC and CMP released today.  Her next lab work will be due in February and every 3 months.  TB gold negative on 07/12/23.  - Plan: CBC with Differential/Platelet, COMPLETE METABOLIC PANEL WITH GFR  Primary osteoarthritis of both hands: X-rays of both hands were consistent with osteoarthritis and inflammatory arthritis overlap.  CMC, PIP, DIP thickening noted.  No dactylitis noted on exam.  Pes planus of both feet: Chronic pain.  Warmth of both ankles noted.    Primary osteoarthritis of both knees - Cortisone injection performed by Dr. Denyse Amass in July 2024.  She has good range of motion of both knee joints  on examination today.  No warmth or effusion noted.  Pain in both feet - Dr. Lucilla Lame consistent with inflammatory arthritis and osteoarthritis overlap.  Previous surgeries performed by Dr. Lajoyce Corners.  Chronic pain.   Chronic midline low back pain without sciatica: No symptoms of sciatica currently.  Other medical conditions are listed as follows:  LADA (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), managed as type 1 (HCC)  Malignant neoplasm of right upper lobe of lung (HCC) - diagnosed by Dr. Shirline Frees in 2015.  She was treated with chemotherapy and radiation therapy.  Patient states she did not finish chemotherapy.  Radiation-induced esophageal stricture  History of esophagitis  Neuropathy due to chemotherapeutic drug (HCC)  History of hypertension: Blood pressure is 115/80 today in the office.  RLS (restless legs syndrome)  History of depression  History of subarachnoid hemorrhage  Aortic atherosclerosis (HCC)  Former smoker  Family history of psoriasis in maternal grandmother  Orders: Orders Placed This Encounter  Procedures   CBC with Differential/Platelet   COMPLETE METABOLIC PANEL WITH GFR   No orders of the defined types were placed in this encounter.     Follow-Up Instructions: Return in about 3 months (around 12/14/2023) for Psoriatic arthritis, Osteoarthritis.   Gearldine Bienenstock, PA-C  Note - This record has been created using Dragon software.  Chart creation errors have been sought, but may not always  have been located. Such creation errors do not reflect on  the standard of medical care.

## 2023-09-04 ENCOUNTER — Ambulatory Visit: Payer: Medicare PPO | Attending: Cardiovascular Disease

## 2023-09-04 ENCOUNTER — Ambulatory Visit: Payer: Medicare PPO | Attending: Cardiovascular Disease | Admitting: Cardiovascular Disease

## 2023-09-04 ENCOUNTER — Encounter: Payer: Self-pay | Admitting: Cardiovascular Disease

## 2023-09-04 VITALS — BP 112/74 | HR 100 | Ht 65.0 in | Wt 183.0 lb

## 2023-09-04 DIAGNOSIS — I10 Essential (primary) hypertension: Secondary | ICD-10-CM

## 2023-09-04 DIAGNOSIS — E139 Other specified diabetes mellitus without complications: Secondary | ICD-10-CM

## 2023-09-04 DIAGNOSIS — E782 Mixed hyperlipidemia: Secondary | ICD-10-CM

## 2023-09-04 DIAGNOSIS — E1149 Type 2 diabetes mellitus with other diabetic neurological complication: Secondary | ICD-10-CM

## 2023-09-04 DIAGNOSIS — R000 Tachycardia, unspecified: Secondary | ICD-10-CM

## 2023-09-04 DIAGNOSIS — R0602 Shortness of breath: Secondary | ICD-10-CM

## 2023-09-04 DIAGNOSIS — I700 Atherosclerosis of aorta: Secondary | ICD-10-CM

## 2023-09-04 MED ORDER — AMLODIPINE BESYLATE 5 MG PO TABS: ORAL_TABLET | ORAL | 3 refills | Status: DC

## 2023-09-04 NOTE — Progress Notes (Signed)
Cardiology Office Note:    Date:  09/10/2023   ID:  Megan [name], DOB Jun 09, 1941, MRN [id number]  PCP:  Pincus Sanes, MD   [facility] Providers Cardiologist:  Thurmon Fair, MD     Referring MD: Pincus Sanes, MD   Chief Complaint  Patient presents with   Tachycardia    History of Present Illness:    Megan [name] is a 82 y.o. female with a hx of hypertensive syncope, late onset type 1 diabetes mellitus, non-small cell lung cancer stage IIIb successfully treated with carboplatin/paclitaxel (residual neuropathy), GERD with esophageal stricture requiring esophageal plasty in 2021, HTN, OSA, psoriatic/rheumatoid arthritis, colitis, presenting for recent persistent tachycardia.  She rarely has palpitations but has noticed that recent visits with her physicians that her heart rate is fast in the 100-120 bpm range.  Today her ECG shows sinus tachycardia at 100 bpm.   She has a history of orthostatic dizziness that led to falls and bilateral leg fractures in the past.  Since switching from losartan to amlodipine she has not had any further falls, although she does occasionally feel dizzy and "foggy headed".  She is currently taking amlodipine 10 mg once daily.  She reports that she has recorded recent 5 pound weight gain in about a week.  She denies chest pain at rest or with activity, recent syncope, claudication, focal neurological events.  She takes Symbicort intermittently.  She has recently been taking it more regularly since she otherwise finds it hard to sleep at night due to her "raspy" breath.  It does not sound like she is describing true orthopnea or PND.  She has intermittent problems with lower extremity edema and has previously seen Dr. Edilia Bo for peripheral venous insufficiency.  Doppler studies have shown evidence of venous insufficiency at the level of the right common femoral and right greater saphenous vein and she has previously had superficial  venous thrombosis.  She wore a rhythm monitor in August 2017 that showed sinus rhythm/mild sinus tachycardia, no arrhythmia.  Her echocardiogram in 2017 was performed for "tachycardia" and did not show significant structural abnormalities.  LVEF was 60 to 65% and there were no regional wall motion abnormalities.  Diastolic function could not be assessed due to E-A fusion. The heart rate was 109 bpm.  Past Medical History:  Diagnosis Date   Cancer (HCC) dx'd 09/2013   Lung ca   Concussion 2018   Diabetes (HCC) 09/08/2016   type I diabetic patient reported on 09/13/16    Diabetes mellitus    insulin   Diabetic neuropathy (HCC) 09/08/2016   Fall 2018   GERD (gastroesophageal reflux disease)    no meds for   History of colitis    History of hiatal hernia    Hx of radiation therapy 12/16/13-01/30/14   lung 66Gy   Hypertension    Malignant neoplasm of right upper lobe of lung (HCC) 11/28/2013   Neuropathy    Psoriasis    Psoriatic arthritis (HCC) 09/08/2016   Rheumatoid arthritis (HCC)    Shortness of breath dyspnea    with exertion    Past Surgical History:  Procedure Laterality Date   ABDOMINAL HYSTERECTOMY     ABDOMINAL SURGERY     BALLOON DILATION N/A 12/12/2014   Procedure: BALLOON DILATION;  Surgeon: Louis Meckel, MD;  Location: [facility] ENDOSCOPY;  Service: Endoscopy;  Laterality: N/A;   BIOPSY  01/20/2020   Procedure: BIOPSY;  Surgeon: Sherrilyn Rist, MD;  Location:  [facility]OSCOPY;  Service: Gastroenterology;;   CHOLECYSTECTOMY     COLONOSCOPY N/A 02/07/2016   Procedure: COLONOSCOPY;  Surgeon: Sherrilyn Rist, MD;  Location: [facility] ENDOSCOPY;  Service: Endoscopy;  Laterality: N/A;   DILATION AND CURETTAGE OF UTERUS     ESOPHAGOGASTRODUODENOSCOPY N/A 12/12/2014   Procedure: ESOPHAGOGASTRODUODENOSCOPY (EGD);  Surgeon: Louis Meckel, MD;  Location: [facility];  Service: Endoscopy;  Laterality: N/A;  with dilation   ESOPHAGOGASTRODUODENOSCOPY (EGD) WITH PROPOFOL N/A 10/09/2014    Procedure: ESOPHAGOGASTRODUODENOSCOPY (EGD) WITH PROPOFOL;  Surgeon: Louis Meckel, MD;  Location: [facility] ENDOSCOPY;  Service: Endoscopy;  Laterality: N/A;   ESOPHAGOGASTRODUODENOSCOPY (EGD) WITH PROPOFOL N/A 09/19/2016   Procedure: ESOPHAGOGASTRODUODENOSCOPY (EGD) WITH PROPOFOL;  Surgeon: Sherrilyn Rist, MD;  Location: [facility]OSCOPY;  Service: Gastroenterology;  Laterality: N/A;  with savary dil tt   ESOPHAGOGASTRODUODENOSCOPY (EGD) WITH PROPOFOL N/A 01/20/2020   Procedure: ESOPHAGOGASTRODUODENOSCOPY (EGD) WITH PROPOFOL;  Surgeon: Sherrilyn Rist, MD;  Location: [facility]OS[facility];  Service: Gastroenterology;  Laterality: N/A;  Fluoro-Savary   ORIF ANKLE FRACTURE Right 07/04/2015   Procedure: OPEN REDUCTION INTERNAL FIXATION (ORIF) ANKLE FRACTURE;  Surgeon: Nadara Mustard, MD;  Location: [facility] OR;  Service: Orthopedics;  Laterality: Right;  OPEN REDUCTION, INTERNAL FIXATION OF RIGHT ANKLE FRACTURE.    ORIF ANKLE FRACTURE Left 02/10/2016   Procedure: OPEN REDUCTION INTERNAL FIXATION (ORIF) ANKLE FRACTURE;  Surgeon: Nadara Mustard, MD;  Location: [facility] OR;  Service: Orthopedics;  Laterality: Left;   SAVORY DILATION N/A 10/09/2014   Procedure: SAVORY DILATION;  Surgeon: Louis Meckel, MD;  Location: [facility] ENDOSCOPY;  Service: Endoscopy;  Laterality: N/A;   SAVORY DILATION N/A 09/19/2016   Procedure: SAVORY DILATION;  Surgeon: Sherrilyn Rist, MD;  Location: [facility]OS[facility];  Service: Gastroenterology;  Laterality: N/A;   SAVORY DILATION N/A 01/20/2020   Procedure: SAVORY DILATION;  Surgeon: Sherrilyn Rist, MD;  Location: [facility] ENDOS[facility];  Service: Gastroenterology;  Laterality: N/A;   TONSILLECTOMY      Current Medications: Current Meds  Medication Sig   albuterol (VENTOLIN HFA) 108 (90 Base) MCG/ACT inhaler Inhale 2 puffs into the lungs every 6 (six) hours as needed for wheezing or shortness of breath.   B-D UF III MINI PEN NEEDLES 31G X 5 MM MISC USE AS DIRECTED WITH INSULIN 4 TIMES A DAY    budesonide-formoterol (SYMBICORT) 80-4.5 MCG/ACT inhaler Inhale 2 puffs into the lungs 2 (two) times daily.   Continuous Blood Gluc Sensor (DEXCOM G6 SENSOR) MISC Apply new sensor every 10 days for continuous glucose monitoring.   Continuous Blood Gluc Transmit (DEXCOM G6 TRANSMITTER) MISC Use one transmitter every 3 months for continuous glucose monitoring.   DULoxetine (CYMBALTA) 60 MG capsule TAKE 1 CAPSULE BY MOUTH TWICE A DAY   Fluocinolone Acetonide Scalp 0.01 % OIL    fluocinonide (LIDEX) 0.05 % external solution    GVOKE HYPOPEN 1-PACK 1 MG/0.2ML SOAJ Inject under skin 0.2 ml as needed for hypoglycemia   insulin aspart (NOVOLOG FLEXPEN) 100 UNIT/ML FlexPen INJECT 18-22 UNITS UNDER THE SKIN THREE TIMES DAILY   insulin degludec (TRESIBA FLEXTOUCH) 100 UNIT/ML FlexTouch Pen Inject up to 36-38 units into the skin daily   ketoconazole (NIZORAL) 2 % cream    ketoconazole (NIZORAL) 2 % shampoo Apply 1 Application topically 2 (two) times a week.   pravastatin (PRAVACHOL) 40 MG tablet TAKE 1 TABLET(40 MG) BY MOUTH EVERY EVENING   risankizumab-rzaa (SKYRIZI PEN) 150 MG/ML pen Inject 150mg  into the skin at Week  4.   [DISCONTINUED] amLODipine (NORVASC) 10 MG tablet TAKE 1 TABLET BY MOUTH EVERY DAY     Allergies:   Carboplatin, Remicade [infliximab], Other, Rifampin, and Hydromorphone   Social History   Socioeconomic History   Marital status: Divorced    Spouse name: Not on file   Number of children: 0   Years of education: MA   Highest education level: Master's degree (e.g., MA, MS, MEng, MEd, MSW, MBA)  Occupational History   Occupation: Retired    Associate Professor: OTHER  Tobacco Use   Smoking status: Former    Current packs/day: 0.00    Average packs/day: 3.0 packs/day for 45.0 years (135.0 ttl pk-yrs)    Types: Cigarettes    Start date: 12/05/1952    Quit date: 12/05/1997    Years since quitting: 25.7    Passive exposure: Never   Smokeless tobacco: Never  Vaping Use   Vaping status: Never  Used  Substance and Sexual Activity   Alcohol use: Yes    Comment: rarely    Drug use: No   Sexual activity: Not Currently  Other Topics Concern   Not on file  Social History Narrative   Patient lives at home. She has an old friend who is her caregiver, Madelaine Bhat, who stays with her 24/7.   Caffeine Use: 1 cups daily   Right handed   Social Determinants of Health   Financial Resource Strain: Low Risk  (05/30/2023)   Overall Financial Resource Strain (CARDIA)    Difficulty of Paying Living Expenses: Not hard at all  Food Insecurity: No Food Insecurity (05/30/2023)   Hunger Vital Sign    Worried About Running Out of Food in the Last Year: Never true    Ran Out of Food in the Last Year: Never true  Transportation Needs: No Transportation Needs (05/30/2023)   PRAPARE - Administrator, Civil Service (Medical): No    Lack of Transportation (Non-Medical): No  Physical Activity: Inactive (05/30/2023)   Exercise Vital Sign    Days of Exercise per Week: 0 days    Minutes of Exercise per Session: 0 min  Stress: No Stress Concern Present (05/30/2023)   [employer] of Occupational Health - Occupational Stress Questionnaire    Feeling of Stress : Not at all  Social Connections: Moderately Integrated (05/30/2023)   Social Connection and Isolation Panel [NHANES]    Frequency of Communication with Friends and Family: Once a week    Frequency of Social Gatherings with Friends and Family: More than three times a week    Attends Religious Services: 1 to 4 times per year    Active Member of [employer] or Organizations: Yes    Attends Banker Meetings: 1 to 4 times per year    Marital Status: Divorced     Family History: The patient's family history includes Anuerysm in her mother; Heart attack in an other family member; Hemachromatosis in an other family member; Hypertension in an other family member; Other in her father and mother; Rheum arthritis in an other family  member; Stroke in an other family member. There is no history of Migraines, Colon cancer, Colon polyps, Esophageal cancer, Pancreatic cancer, Stomach cancer, or Liver disease.  ROS:   Please see the history of present illness.     All other systems reviewed and are negative.  EKGs/Labs/Other Studies Reviewed:    The following studies were reviewed today: Echo and event monitor from 2017 EKG Interpretation Date/Time:  Monday September 04 2023 08:47:46 EST Ventricular Rate:  100 PR Interval:  204 QRS Duration:  92 QT Interval:  356 QTC Calculation: 459 R Axis:   8  Text Interpretation: Normal sinus rhythm Possible Left atrial enlargement Cannot rule out Anterior infarct , age undetermined When compared with ECG of 10-Oct-2018 22:49, QT has shortened Confirmed by Leianne Callins [id number]) on 09/04/2023 9:01:04 AM    Recent Labs: 06/14/2023: ALT 15; BUN 17; Creatinine, Ser 0.94; Potassium 3.4; Sodium 141; TSH 2.47 07/12/2023: Hemoglobin 14.3; Platelets 260  Recent Lipid Panel    Component Value Date/Time   CHOL 170 06/14/2023 1345   TRIG 171.0 (H) 06/14/2023 1345   HDL 47.50 06/14/2023 1345   CHOLHDL 4 06/14/2023 1345   VLDL 34.2 06/14/2023 1345   LDLCALC 88 06/14/2023 1345   LDLDIRECT 156.0 05/10/2019 1454    Studies Reviewed: [name]   EKG Interpretation Date/Time:  Monday September 04 2023 08:47:46 EST Ventricular Rate:  100 PR Interval:  204 QRS Duration:  92 QT Interval:  356 QTC Calculation: 459 R Axis:   8  Text Interpretation: Normal sinus rhythm Possible Left atrial enlargement Cannot rule out Anterior infarct , age undetermined When compared with ECG of 10-Oct-2018 22:49, QT has shortened Confirmed by Varnika Butz [phone]) on 09/04/2023 9:01:04 AM     Risk Assessment/Calculations:          Physical Exam:   VS:  BP 112/74 (BP Location: Left Arm, Patient Position: Sitting, Cuff Size: Normal)   Pulse 100   Ht 5\' 5"  (1.651 m)   Wt 183 lb (83 kg)   SpO2 95%   BMI 30.45  kg/m    Wt Readings from Last 3 Encounters:  09/04/23 183 lb (83 kg)  07/12/23 172 lb 3.2 oz (78.1 kg)  06/14/23 167 lb (75.8 kg)    GEN: Well nourished, well developed in no acute distress NECK: No JVD; No carotid bruits CARDIAC: RRR, no murmurs, rubs, gallops RESPIRATORY:  Clear to auscultation without rales, wheezing or rhonchi  ABDOMEN: Soft, non-tender, non-distended EXTREMITIES: Prominent varicose veins, 1+ ankle edema, wrinkling of the skin suggest that edema has been worse in the past.  ASSESSMENT AND PLAN: .    Sinus tachycardia: Sounds like all her episodes of rapid heart rate are due to sinus tachycardia, which is usually a physiological response/adaptive mechanism, rather than a true arrhythmia.  She rarely has palpitations and is mostly asymptomatic as far as her fast heart rate is concerned.  It is conceivable that this is a response to relative hypotension and I think we should decrease her amlodipine.  She does have some symptoms of orthostatic hypotension and falls in the past, which led to discontinuation of her losartan.  Also, she takes beta-1 agonists (Symbicort, albuterol) which could well explain her tachycardia.  Finally she may have some degree of autonomic dysfunction related to diabetes mellitus-related neuropathy.  Will check a monitor to make sure that we are dealing just with sinus tachycardia.  Will also check an echocardiogram to make sure that her problems with tachycardia are not due to LV dysfunction or other structural cardiac abnormalities (she has shortness of breath but this could be explained by her lung problems.). HTN: Her blood pressure is very well-controlled, maybe even excessively so.  Reduce the amlodipine to 5 mg once daily.  Avoid diuretics. DM type 1: Insulin-dependent.  Hemoglobin A1c shows an acceptable control for an octogenarian at 7.8%.  However, she has had diabetes for many years so is quite  possible she has developed autonomic neuropathy  (superimposed on her chemotherapy related neuropathy).  This would increase her odds of developing orthostatic hypotension and tachycardia. HLP: All lipid parameters are in desirable range based on her known medical issues.  She does not have CAD or PAD that we are aware of, but aortic atherosclerosis has been seen on imaging studies..  Target LDL < 100 is acceptable.  Continue pravastatin.       Dispo:  Patient Instructions  Medication Instructions:  Decrease: Amlodipine (Norvasc) to 5 mg once Daily  *If you need a refill on your cardiac medications before your next appointment, please call your pharmacy*  Lab Work: None  Testing/Procedures: Your physician has requested that you have an echocardiogram. Echocardiography is a painless test that uses sound waves to create images of your heart. It provides your doctor with information about the size and shape of your heart and how well your heart's chambers and valves are working. This procedure takes approximately one hour. There are no restrictions for this procedure. Please do NOT wear cologne, perfume, aftershave, or lotions (deodorant is allowed). Please arrive 15 minutes prior to your appointment time. This will take place at [address]   *Do not have your 3 day heart monitor on during Echocardiogram  ZIO XT- Long Term Monitor Instructions  Your physician has requested you wear a ZIO patch monitor for 3 days.  This is a single patch monitor. [employer] supplies one patch monitor per enrollment. Additional stickers are not available. Please do not apply patch if you will be having a Nuclear Stress Test,  Echocardiogram, Cardiac CT, MRI, or Chest Xray during the period you would be wearing the  monitor. The patch cannot be worn during these tests. You cannot remove and re-apply the  ZIO XT patch monitor.  Your ZIO patch monitor will be mailed 3 day USPS to your address on file. It may take 3-5 days  to receive your monitor  after you have been enrolled.  Once you have received your monitor, please review the enclosed instructions. Your monitor  has already been registered assigning a specific monitor serial # to you.  Billing and Patient Assistance Program Information  We have supplied [employer] with any of your insurance information on file for billing purposes. [employer] offers a sliding scale Patient Assistance Program for patients that do not have  insurance, or whose insurance does not completely cover the cost of the ZIO monitor.  You must apply for the Patient Assistance Program to qualify for this discounted rate.  To apply, please call [employer] at [phone], select option 4, select option 2, ask to apply for  Patient Assistance Program. Meredeth Ide will ask your household income, and how many people  are in your household. They will quote your out-of-pocket cost based on that information.  [employer] will also be able to set up a 68-month, interest-free payment plan if needed.  Applying the monitor   Shave hair from upper left chest.  Hold abrader disc by orange tab. Rub abrader in 40 strokes over the upper left chest as  indicated in your monitor instructions.  Clean area with 4 enclosed alcohol pads. Let dry.  Apply patch as indicated in monitor instructions. Patch will be placed under collarbone on left  side of chest with arrow pointing upward.  Rub patch adhesive wings for 2 minutes. Remove white label marked "1". Remove the white  label marked "2". Rub patch adhesive wings for 2 additional  minutes.  While looking in a mirror, press and release button in center of patch. A small green light will  flash 3-4 times. This will be your only indicator that the monitor has been turned on.  Do not shower for the first 24 hours. You may shower after the first 24 hours.  Press the button if you feel a symptom. You will hear a small click. Record Date, Time and  Symptom in the Patient Logbook.  When you are  ready to remove the patch, follow instructions on the last 2 pages of Patient  Logbook. Stick patch monitor onto the last page of Patient Logbook.  Place Patient Logbook in the blue and white box. Use locking tab on box and tape box closed  securely. The blue and white box has prepaid postage on it. Please place it in the mailbox as  soon as possible. Your physician should have your test results approximately 7 days after the  monitor has been mailed back to [facility].  Call [facility] Customer Care at [phone] if you have questions regarding  your ZIO XT patch monitor. Call them immediately if you see an orange light blinking on your  monitor.  If your monitor falls off in less than 4 days, contact our Monitor department at [phone].  If your monitor becomes loose or falls off after 4 days call [employer] at [phone] for  suggestions on securing your monitor    Follow-Up: At [facility], you and your health needs are our priority.  As part of our continuing mission to provide you with exceptional heart care, we have created designated Provider Care Teams.  These Care Teams include your primary Cardiologist (physician) and Advanced Practice Providers (APPs -  Physician Assistants and Nurse Practitioners) who all work together to provide you with the care you need, when you need it.  We recommend signing up for the patient portal called "MyChart".  Sign up information is provided on this After Visit Summary.  MyChart is used to connect with patients for Virtual Visits (Telemedicine).  Patients are able to view lab/test results, encounter notes, upcoming appointments, etc.  Non-urgent messages can be sent to your provider as well.   To learn more about what you can do with MyChart, go to [email or web address].    Your next appointment:   As Needed   Provider:   Thurmon Fair, MD     Signed, Thurmon Fair, MD

## 2023-09-04 NOTE — Patient Instructions (Addendum)
Medication Instructions:  Decrease: Amlodipine (Norvasc) to 5 mg once Daily  *If you need a refill on your cardiac medications before your next appointment, please call your pharmacy*  Lab Work: None  Testing/Procedures: Your physician has requested that you have an echocardiogram. Echocardiography is a painless test that uses sound waves to create images of your heart. It provides your doctor with information about the size and shape of your heart and how well your heart's chambers and valves are working. This procedure takes approximately one hour. There are no restrictions for this procedure. Please do NOT wear cologne, perfume, aftershave, or lotions (deodorant is allowed). Please arrive 15 minutes prior to your appointment time. This will take place at [address]   *Do not have your 3 day heart monitor on during Echocardiogram  ZIO XT- Long Term Monitor Instructions  Your physician has requested you wear a ZIO patch monitor for 3 days.  This is a single patch monitor. [employer] supplies one patch monitor per enrollment. Additional stickers are not available. Please do not apply patch if you will be having a Nuclear Stress Test,  Echocardiogram, Cardiac CT, MRI, or Chest Xray during the period you would be wearing the  monitor. The patch cannot be worn during these tests. You cannot remove and re-apply the  ZIO XT patch monitor.  Your ZIO patch monitor will be mailed 3 day USPS to your address on file. It may take 3-5 days  to receive your monitor after you have been enrolled.  Once you have received your monitor, please review the enclosed instructions. Your monitor  has already been registered assigning a specific monitor serial # to you.  Billing and Patient Assistance Program Information  We have supplied [employer] with any of your insurance information on file for billing purposes. [employer] offers a sliding scale Patient Assistance Program for patients that do not  have  insurance, or whose insurance does not completely cover the cost of the ZIO monitor.  You must apply for the Patient Assistance Program to qualify for this discounted rate.  To apply, please call [employer] at [phone], select option 4, select option 2, ask to apply for  Patient Assistance Program. Meredeth Ide will ask your household income, and how many people  are in your household. They will quote your out-of-pocket cost based on that information.  [employer] will also be able to set up a 16-month, interest-free payment plan if needed.  Applying the monitor   Shave hair from upper left chest.  Hold abrader disc by orange tab. Rub abrader in 40 strokes over the upper left chest as  indicated in your monitor instructions.  Clean area with 4 enclosed alcohol pads. Let dry.  Apply patch as indicated in monitor instructions. Patch will be placed under collarbone on left  side of chest with arrow pointing upward.  Rub patch adhesive wings for 2 minutes. Remove white label marked "1". Remove the white  label marked "2". Rub patch adhesive wings for 2 additional minutes.  While looking in a mirror, press and release button in center of patch. A small green light will  flash 3-4 times. This will be your only indicator that the monitor has been turned on.  Do not shower for the first 24 hours. You may shower after the first 24 hours.  Press the button if you feel a symptom. You will hear a small click. Record Date, Time and  Symptom in the Patient Logbook.  When you  are ready to remove the patch, follow instructions on the last 2 pages of Patient  Logbook. Stick patch monitor onto the last page of Patient Logbook.  Place Patient Logbook in the blue and white box. Use locking tab on box and tape box closed  securely. The blue and white box has prepaid postage on it. Please place it in the mailbox as  soon as possible. Your physician should have your test results approximately 7 days after the   monitor has been mailed back to [facility].  Call [facility] Customer Care at [phone] if you have questions regarding  your ZIO XT patch monitor. Call them immediately if you see an orange light blinking on your  monitor.  If your monitor falls off in less than 4 days, contact our Monitor department at [phone].  If your monitor becomes loose or falls off after 4 days call [employer] at [phone] for  suggestions on securing your monitor    Follow-Up: At [facility], you and your health needs are our priority.  As part of our continuing mission to provide you with exceptional heart care, we have created designated Provider Care Teams.  These Care Teams include your primary Cardiologist (physician) and Advanced Practice Providers (APPs -  Physician Assistants and Nurse Practitioners) who all work together to provide you with the care you need, when you need it.  We recommend signing up for the patient portal called "MyChart".  Sign up information is provided on this After Visit Summary.  MyChart is used to connect with patients for Virtual Visits (Telemedicine).  Patients are able to view lab/test results, encounter notes, upcoming appointments, etc.  Non-urgent messages can be sent to your provider as well.   To learn more about what you can do with MyChart, go to [email or web address].    Your next appointment:   As Needed   Provider:   Thurmon Fair, MD

## 2023-09-04 NOTE — Progress Notes (Unsigned)
Enrolled for [employer] to mail a ZIO XT long term holter monitor to the patients address on file.  

## 2023-09-09 DIAGNOSIS — R000 Tachycardia, unspecified: Secondary | ICD-10-CM | POA: Diagnosis not present

## 2023-09-09 DIAGNOSIS — R0602 Shortness of breath: Secondary | ICD-10-CM

## 2023-09-13 ENCOUNTER — Ambulatory Visit: Payer: Medicare PPO | Attending: Physician Assistant | Admitting: Physician Assistant

## 2023-09-13 ENCOUNTER — Encounter: Payer: Self-pay | Admitting: Physician Assistant

## 2023-09-13 VITALS — BP 115/80 | HR 103 | Resp 17 | Ht 65.0 in | Wt 179.0 lb

## 2023-09-13 DIAGNOSIS — M79671 Pain in right foot: Secondary | ICD-10-CM

## 2023-09-13 DIAGNOSIS — Z79899 Other long term (current) drug therapy: Secondary | ICD-10-CM | POA: Diagnosis not present

## 2023-09-13 DIAGNOSIS — C3411 Malignant neoplasm of upper lobe, right bronchus or lung: Secondary | ICD-10-CM

## 2023-09-13 DIAGNOSIS — K222 Esophageal obstruction: Secondary | ICD-10-CM

## 2023-09-13 DIAGNOSIS — M79672 Pain in left foot: Secondary | ICD-10-CM

## 2023-09-13 DIAGNOSIS — Z87891 Personal history of nicotine dependence: Secondary | ICD-10-CM

## 2023-09-13 DIAGNOSIS — M2141 Flat foot [pes planus] (acquired), right foot: Secondary | ICD-10-CM | POA: Diagnosis not present

## 2023-09-13 DIAGNOSIS — Z8659 Personal history of other mental and behavioral disorders: Secondary | ICD-10-CM

## 2023-09-13 DIAGNOSIS — M545 Low back pain, unspecified: Secondary | ICD-10-CM

## 2023-09-13 DIAGNOSIS — L4050 Arthropathic psoriasis, unspecified: Secondary | ICD-10-CM

## 2023-09-13 DIAGNOSIS — M170 Bilateral primary osteoarthritis of knee: Secondary | ICD-10-CM | POA: Diagnosis not present

## 2023-09-13 DIAGNOSIS — T451X5A Adverse effect of antineoplastic and immunosuppressive drugs, initial encounter: Secondary | ICD-10-CM

## 2023-09-13 DIAGNOSIS — L409 Psoriasis, unspecified: Secondary | ICD-10-CM

## 2023-09-13 DIAGNOSIS — G8929 Other chronic pain: Secondary | ICD-10-CM

## 2023-09-13 DIAGNOSIS — M19041 Primary osteoarthritis, right hand: Secondary | ICD-10-CM | POA: Diagnosis not present

## 2023-09-13 DIAGNOSIS — Z8719 Personal history of other diseases of the digestive system: Secondary | ICD-10-CM

## 2023-09-13 DIAGNOSIS — E139 Other specified diabetes mellitus without complications: Secondary | ICD-10-CM

## 2023-09-13 DIAGNOSIS — G2581 Restless legs syndrome: Secondary | ICD-10-CM

## 2023-09-13 DIAGNOSIS — Z840 Family history of diseases of the skin and subcutaneous tissue: Secondary | ICD-10-CM

## 2023-09-13 DIAGNOSIS — Z8679 Personal history of other diseases of the circulatory system: Secondary | ICD-10-CM

## 2023-09-13 DIAGNOSIS — G620 Drug-induced polyneuropathy: Secondary | ICD-10-CM

## 2023-09-13 DIAGNOSIS — M2142 Flat foot [pes planus] (acquired), left foot: Secondary | ICD-10-CM

## 2023-09-13 DIAGNOSIS — I700 Atherosclerosis of aorta: Secondary | ICD-10-CM

## 2023-09-13 DIAGNOSIS — M19042 Primary osteoarthritis, left hand: Secondary | ICD-10-CM

## 2023-09-13 NOTE — Patient Instructions (Signed)
 Standing Labs We placed an order today for your standing lab work.   Please have your standing labs drawn in February and every 3 months   Please have your labs drawn 2 weeks prior to your appointment so that the provider can discuss your lab results at your appointment, if possible.  Please note that you may see your imaging and lab results in MyChart before we have reviewed them. We will contact you once all results are reviewed. Please allow our office up to 72 hours to thoroughly review all of the results before contacting the office for clarification of your results.  WALK-IN LAB HOURS  Monday through Thursday from 8:00 am -12:30 pm and 1:00 pm-5:00 pm and Friday from 8:00 am-12:00 pm.  Patients with office visits requiring labs will be seen before walk-in labs.  You may encounter longer than normal wait times. Please allow additional time. Wait times may be shorter on  Monday and Thursday afternoons.  We do not book appointments for walk-in labs. We appreciate your patience and understanding with our staff.   Labs are drawn by [facility]. Please bring your co-pay at the time of your lab draw.  You may receive a bill from [facility] for your lab work.  Please note if you are on Hydroxychloroquine and and an order has been placed for a Hydroxychloroquine level,  you will need to have it drawn 4 hours or more after your last dose.  If you wish to have your labs drawn at another location, please call the office 24 hours in advance so we can fax the orders.  The office is located at [address], [place], [place] [address]   If you have any questions regarding directions or hours of operation,  please call [phone].   As a reminder, please drink plenty of water prior to coming for your lab work. Thanks!

## 2023-09-14 LAB — CBC WITH DIFFERENTIAL/PLATELET
Absolute Lymphocytes: 2042 {cells}/uL (ref 850–3900)
Absolute Monocytes: 888 {cells}/uL (ref 200–950)
Basophils Absolute: 89 {cells}/uL (ref 0–200)
Basophils Relative: 0.8 %
Eosinophils Absolute: 577 {cells}/uL — ABNORMAL HIGH (ref 15–500)
Eosinophils Relative: 5.2 %
HCT: 43.7 % (ref 35.0–45.0)
Hemoglobin: 14.1 g/dL (ref 11.7–15.5)
MCH: 28 pg (ref 27.0–33.0)
MCHC: 32.3 g/dL (ref 32.0–36.0)
MCV: 86.7 fL (ref 80.0–100.0)
MPV: 9.6 fL (ref 7.5–12.5)
Monocytes Relative: 8 %
Neutro Abs: 7504 {cells}/uL (ref 1500–7800)
Neutrophils Relative %: 67.6 %
Platelets: 242 10*3/uL (ref 140–400)
RBC: 5.04 10*6/uL (ref 3.80–5.10)
RDW: 13.9 % (ref 11.0–15.0)
Total Lymphocyte: 18.4 %
WBC: 11.1 10*3/uL — ABNORMAL HIGH (ref 3.8–10.8)

## 2023-09-14 LAB — COMPLETE METABOLIC PANEL WITH GFR
AG Ratio: 1.8 (calc) (ref 1.0–2.5)
ALT: 14 U/L (ref 6–29)
AST: 15 U/L (ref 10–35)
Albumin: 4.2 g/dL (ref 3.6–5.1)
Alkaline phosphatase (APISO): 117 U/L (ref 37–153)
BUN/Creatinine Ratio: 21 (calc) (ref 6–22)
BUN: 21 mg/dL (ref 7–25)
CO2: 29 mmol/L (ref 20–32)
Calcium: 9.2 mg/dL (ref 8.6–10.4)
Chloride: 101 mmol/L (ref 98–110)
Creat: 1 mg/dL — ABNORMAL HIGH (ref 0.60–0.95)
Globulin: 2.4 g/dL (ref 1.9–3.7)
Glucose, Bld: 131 mg/dL — ABNORMAL HIGH (ref 65–99)
Potassium: 3.5 mmol/L (ref 3.5–5.3)
Sodium: 139 mmol/L (ref 135–146)
Total Bilirubin: 0.6 mg/dL (ref 0.2–1.2)
Total Protein: 6.6 g/dL (ref 6.1–8.1)
eGFR: 57 mL/min/{1.73_m2} — ABNORMAL LOW (ref >=60)

## 2023-09-14 NOTE — Progress Notes (Signed)
CMC and CBC are stable.  Glucose is elevated probably not a fasting sample.  Creatinine is mildly elevated.  Please forward results to her PCP.

## 2023-09-19 ENCOUNTER — Other Ambulatory Visit: Payer: Self-pay | Admitting: Internal Medicine

## 2023-09-19 DIAGNOSIS — H2513 Age-related nuclear cataract, bilateral: Secondary | ICD-10-CM | POA: Diagnosis not present

## 2023-09-19 DIAGNOSIS — E119 Type 2 diabetes mellitus without complications: Secondary | ICD-10-CM | POA: Diagnosis not present

## 2023-09-20 DIAGNOSIS — R0602 Shortness of breath: Secondary | ICD-10-CM | POA: Diagnosis not present

## 2023-09-20 DIAGNOSIS — R000 Tachycardia, unspecified: Secondary | ICD-10-CM | POA: Diagnosis not present

## 2023-09-20 DIAGNOSIS — E139 Other specified diabetes mellitus without complications: Secondary | ICD-10-CM | POA: Diagnosis not present

## 2023-09-27 ENCOUNTER — Telehealth: Payer: Self-pay | Admitting: Emergency Medicine

## 2023-09-27 DIAGNOSIS — E139 Other specified diabetes mellitus without complications: Secondary | ICD-10-CM | POA: Diagnosis not present

## 2023-09-27 DIAGNOSIS — I10 Essential (primary) hypertension: Secondary | ICD-10-CM | POA: Diagnosis not present

## 2023-09-27 DIAGNOSIS — E782 Mixed hyperlipidemia: Secondary | ICD-10-CM | POA: Diagnosis not present

## 2023-09-27 NOTE — Telephone Encounter (Signed)
Called to go over results of Long term monitor- results too long to leave over machine. Left call back number. (Results below)  Croitoru, Rachelle Hora, MD  Pincus Sanes, MD Cc: Scheryl Marten, RN As we suspected at the time of her clinic visit, her relatively fast heartbeat represents what we call sinus tachycardia ("normal fast heartbeat"). Most commonly, this does not indicate a problem with the heart itself, but a normal response of the heart rate to external stimulation (anemia, dehydration, overactive thyroid, infection, anxiety, many other causes). 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  In other words, due to slow longstanding damage to the peripheral nerve endings from diabetes or chemotherapy, the body has a more difficult time maintaining normal regulation of blood vessel tone and maintenance of normal blood pressure.  The solution is to make sure she is always very well-hydrated and avoid diuretics, change position gradually, avoid prolonged standing without moving.   I hope that reducing her dose of amlodipine will help a little bit. If she starts developing symptoms of low blood pressure when standing up (orthostatic hypotension), dizziness, etc., we can decrease amlodipine further and it may be helpful to wear external compression stockings.

## 2023-10-02 NOTE — Telephone Encounter (Signed)
Spoke with pt, aware of dr croitoru's recommendations. She reports she will not wear compression hose.

## 2023-10-02 NOTE — Telephone Encounter (Signed)
Pt returning call for results

## 2023-10-03 DIAGNOSIS — R399 Unspecified symptoms and signs involving the genitourinary system: Secondary | ICD-10-CM | POA: Diagnosis not present

## 2023-10-04 ENCOUNTER — Ambulatory Visit (HOSPITAL_COMMUNITY): Payer: Medicare PPO | Attending: Internal Medicine

## 2023-10-04 DIAGNOSIS — R000 Tachycardia, unspecified: Secondary | ICD-10-CM | POA: Diagnosis not present

## 2023-10-04 DIAGNOSIS — R0602 Shortness of breath: Secondary | ICD-10-CM | POA: Diagnosis not present

## 2023-10-04 LAB — ECHOCARDIOGRAM COMPLETE
Area-P 1/2: 4.29 cm2
P 1/2 time: 304 ms
S' Lateral: 2.3 cm

## 2023-10-05 ENCOUNTER — Telehealth: Payer: Self-pay | Admitting: Emergency Medicine

## 2023-10-05 NOTE — Telephone Encounter (Signed)
Heart pumping strength is excellent.  However, the heart muscle walls are thickened.  The most common reason for this (by far) is insufficiently treated high blood pressure over many years.  The heart muscle can then remain excessively thickened even after the blood pressure has been better controlled.  Sometimes, this causes issues with filling of the heart pumping chamber, which in turn can cause shortness of breath.  There is a mild-moderate leak in the aortic valve that is unlikely to be clinically important. How is she feeling and how is her heart rate after we cut back on the dose of amlodipine?  I went over the information above with the patient.   She states that she did not cut back on the dose yet. (I asked her why, and she just didn't do it) She finished out the prescription that she had and will start on the decreased dose tomorrow. She said to let Dr C know that she will be more diligent from now on.

## 2023-10-17 ENCOUNTER — Other Ambulatory Visit: Payer: Self-pay | Admitting: Neurology

## 2023-10-24 ENCOUNTER — Other Ambulatory Visit: Payer: Self-pay

## 2023-10-24 ENCOUNTER — Telehealth: Payer: Self-pay | Admitting: Pharmacist

## 2023-10-24 NOTE — Telephone Encounter (Signed)
Submitted a Prior Authorization RENEWAL request to [facility] for SKYRIZI SQ via CoverMyMeds. Will update once we receive a response.  Key: [id number]   Per automated response: Authorization already on file for this request. Authorization starting on 08/01/2023 and ending on 10/30/2024.  Chesley Mires, PharmD, MPH, BCPS, CPP Clinical Pharmacist (Rheumatology and Pulmonology)

## 2023-10-25 DIAGNOSIS — E1140 Type 2 diabetes mellitus with diabetic neuropathy, unspecified: Secondary | ICD-10-CM | POA: Diagnosis not present

## 2023-11-02 NOTE — Telephone Encounter (Signed)
 VOB initiated for 2025 coverage of Orthovisc.

## 2023-11-03 ENCOUNTER — Other Ambulatory Visit: Payer: Self-pay

## 2023-11-06 ENCOUNTER — Other Ambulatory Visit (HOSPITAL_COMMUNITY): Payer: Self-pay

## 2023-11-06 ENCOUNTER — Other Ambulatory Visit: Payer: Self-pay

## 2023-11-06 ENCOUNTER — Other Ambulatory Visit: Payer: Self-pay | Admitting: Rheumatology

## 2023-11-06 DIAGNOSIS — Z79899 Other long term (current) drug therapy: Secondary | ICD-10-CM

## 2023-11-06 DIAGNOSIS — L4050 Arthropathic psoriasis, unspecified: Secondary | ICD-10-CM

## 2023-11-06 DIAGNOSIS — L409 Psoriasis, unspecified: Secondary | ICD-10-CM

## 2023-11-06 MED ORDER — SKYRIZI PEN 150 MG/ML ~~LOC~~ SOAJ
SUBCUTANEOUS | 0 refills | Status: DC
  Filled 2023-11-06: qty 1, 84d supply, fill #0

## 2023-11-06 NOTE — Progress Notes (Unsigned)
 Clinical Intervention Note  Clinical Intervention Notes: Patient had questions about dosing frequency, she confirmed that she has had the loading dose in the office and then the dose at week 4.  I reviewed the office visit notes and let her know that she should now be on every 12 week dosing.  We faxed the MD's office for a refill with updated directions.  Patient was understanding and all questions were answered.   Clinical Intervention Outcomes: Improved therapy adherence   Silvano LOISE Dolly Specialty Pharmacist

## 2023-11-06 NOTE — Progress Notes (Signed)
 Specialty Pharmacy Refill Coordination Note  Megan [name] is a 83 y.o. female contacted today regarding refills of specialty medication(s) Risankizumab -rzaa (Skyrizi  Pen)   Patient requested Delivery   Delivery date: 11/08/23   Verified address: [address]  [place] [address]   Medication will be filled on 11/07/23.   Pending refill request

## 2023-11-06 NOTE — Telephone Encounter (Signed)
 Last Fill: 07/25/2023  Labs: 09/13/2023 CMC and CBC are stable.  Glucose is elevated probably not a fasting sample.  Creatinine is mildly elevated.   TB Gold: 07/12/2023 Neg    Next Visit: 12/13/2023  Last Visit: 09/13/2023  IK:Ednmpjupr arthropathy   Current Dose per office note 09/13/2023: Skyrizi  150 mg sq injections every 12 weeks   Okay to refill Skyrizi ?

## 2023-11-07 ENCOUNTER — Other Ambulatory Visit (HOSPITAL_COMMUNITY): Payer: Self-pay

## 2023-11-07 ENCOUNTER — Telehealth: Payer: Self-pay | Admitting: Rheumatology

## 2023-11-07 ENCOUNTER — Other Ambulatory Visit: Payer: Self-pay

## 2023-11-07 ENCOUNTER — Telehealth: Payer: Self-pay | Admitting: Pharmacist

## 2023-11-07 DIAGNOSIS — L4050 Arthropathic psoriasis, unspecified: Secondary | ICD-10-CM

## 2023-11-07 DIAGNOSIS — L409 Psoriasis, unspecified: Secondary | ICD-10-CM

## 2023-11-07 DIAGNOSIS — Z79899 Other long term (current) drug therapy: Secondary | ICD-10-CM

## 2023-11-07 NOTE — Telephone Encounter (Signed)
 Unclear - that was date of last OV. Patient appears to be poor historian of injection history  Chesley Mires, PharmD, MPH, BCPS, CPP Clinical Pharmacist (Rheumatology and Pulmonology)

## 2023-11-07 NOTE — Telephone Encounter (Signed)
 Pt came in to pick up her sample. Pt wanted Devki to know the first time she gave herself the [facility] medication was November 13th 2024

## 2023-11-07 NOTE — Telephone Encounter (Signed)
 Received notice from [facility] that patient's Skyrizi  copay has increased to $300. Will submit PAP for Skyrizi  through [employer]  Spoke with patient. She is picking up sample this afternoon from clinic. Patient states she isn't even sure when she is due for next dose  Skyrizi  pen sample in fridge labeled for pt.  Sherry Pennant, PharmD, MPH, BCPS, CPP Clinical Pharmacist (Rheumatology and Pulmonology)

## 2023-11-07 NOTE — Telephone Encounter (Signed)
 PAP: Application for Cristy Folks has been submitted to PAP Companies: [employer], via fax  Fax# [phone] Phone# [phone]

## 2023-11-07 NOTE — Progress Notes (Signed)
 Patient's copay has jumped to $300, which is unaffordable. Patient Assistance has been initiated for her by the office.

## 2023-11-07 NOTE — Telephone Encounter (Signed)
 Medication Samples have been provided to the patient.  Drug name: Skyrizi        Strength: 150 mg        Qty: 1  LOT: [id number]  Exp.Date: Feb. 2026  Dosing instructions: Inject one pen into skin every 12 weeks.   Patient states she gave herself an injection on 09/13/2023. Patient advised based on that injection her next one would be due 12/06/2023. Patient expressed understanding.

## 2023-11-08 ENCOUNTER — Other Ambulatory Visit: Payer: Self-pay

## 2023-11-08 DIAGNOSIS — L738 Other specified follicular disorders: Secondary | ICD-10-CM | POA: Diagnosis not present

## 2023-11-08 DIAGNOSIS — L409 Psoriasis, unspecified: Secondary | ICD-10-CM | POA: Diagnosis not present

## 2023-11-08 DIAGNOSIS — L400 Psoriasis vulgaris: Secondary | ICD-10-CM | POA: Diagnosis not present

## 2023-11-08 DIAGNOSIS — L720 Epidermal cyst: Secondary | ICD-10-CM | POA: Diagnosis not present

## 2023-11-14 ENCOUNTER — Other Ambulatory Visit: Payer: Self-pay | Admitting: Neurology

## 2023-11-17 ENCOUNTER — Other Ambulatory Visit: Payer: Self-pay | Admitting: Neurology

## 2023-11-17 NOTE — Telephone Encounter (Signed)
ORTHOVISC for LEFT knee OA   [employer]  Primary Insurance: [employer] adv [employer] Co-pay: $40 Co-insurance: undisclosed Deductible: does not apply Prior Auth: NOT required   Knee Injection History 12/29/21 - Kenalog LEFT 04/06/23 - Kenalog LEFT

## 2023-11-17 NOTE — Telephone Encounter (Signed)
 [employer] Stable - Prior Authorization NOT required

## 2023-11-29 NOTE — Progress Notes (Unsigned)
Office Visit Note  Patient: Megan [name]             Date of Birth: 02-09-1941           MRN: [id number]             PCP: Pincus Sanes, MD Referring: Pincus Sanes, MD Visit Date: 12/13/2023 Occupation: @GUAROCC @  Subjective:  Medication monitoring   History of Present Illness: Megan [name] is a 83 y.o. female with history of psoriatic arthritis and osteoarthritis.  Patient remains on  Skyrizi 150 mg sq injections every 12 weeks.  Cristy Folks was initiated on 07/25/23.  She is tolerating Skyrizi without any side effects or injection site reactions.  She denies any recent gaps in therapy.  Patient reports that she has had a 95% improvement in her symptoms since initiating Skyrizi.  Patient states that her psoriasis has almost completely cleared.  She continues to experience intermittent discomfort in her hands but denies any joint swelling.  She denies taking any prednisone recently.  She has occasional discomfort in her left knee and uses an Ace wrap at times which alleviates her discomfort.  Patient states that she has noticed some increased weight gain and is unsure if it is related to the use of Skyrizi.  She denies any other medication changes since her last office visit.    Activities of Daily Living:  Patient reports morning stiffness for 0  none .   Patient Denies nocturnal pain.  Difficulty dressing/grooming: Denies Difficulty climbing stairs: Denies Difficulty getting out of chair: Denies Difficulty using hands for taps, buttons, cutlery, and/or writing: Denies  Review of Systems  Constitutional:  Positive for fatigue.  HENT:  Negative for mouth sores and mouth dryness.   Eyes:  Negative for dryness.  Respiratory:  Positive for shortness of breath.   Cardiovascular:  Negative for chest pain and palpitations.  Gastrointestinal:  Negative for blood in stool, constipation and diarrhea.  Endocrine: Positive for increased urination.  Genitourinary:  Positive for  involuntary urination.  Musculoskeletal:  Positive for joint pain, gait problem, joint pain, joint swelling and muscle tenderness. Negative for myalgias, muscle weakness, morning stiffness and myalgias.  Skin:  Negative for color change, rash, hair loss and sensitivity to sunlight.  Allergic/Immunologic: Positive for susceptible to infections.  Neurological:  Negative for dizziness and headaches.  Hematological:  Negative for swollen glands.  Psychiatric/Behavioral:  Positive for sleep disturbance. Negative for depressed mood. The patient is not nervous/anxious.     PMFS History:  Patient Active Problem List   Diagnosis Date Noted   Vitamin B12 deficiency 06/14/2023   Asthma 06/14/2023   Subclinical hypothyroidism 06/13/2023   Primary osteoarthritis of left knee 04/10/2023   Keratosis 07/15/2022   Onychomycosis of nail of digit of hand 03/03/2022   Nocturia 11/24/2021   OAB (overactive bladder) 11/24/2021   Urge incontinence 11/24/2021   Urinary urgency 11/24/2021   Vaginal atrophy 11/24/2021   Vitamin D deficiency 10/12/2021   Mixed hyperlipidemia 10/12/2021   Frequent UTI 10/12/2021   Aortic atherosclerosis (HCC) 10/09/2020   Fall 05/04/2020   Head pain 05/10/2019   GERD (gastroesophageal reflux disease) 10/17/2018   Herpes zoster without complication 03/01/2018   Right elbow pain 03/01/2018   Carpal tunnel syndrome on right 11/30/2017   Cough 08/22/2017   Primary osteoarthritis of both hands 03/08/2017   Poor balance 02/07/2017   Subarachnoid bleed (HCC) 01/11/2017   Psoriatic arthropathy (HCC) 09/08/2016   Hypertension 09/08/2016   Diabetic  neuropathy (HCC) 09/08/2016   LADA (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), managed as type 1 (HCC) 05/04/2016   Syncope 03/25/2016   RLS (restless legs syndrome) 12/11/2015   Lumbago 05/06/2015   Peripheral edema 04/14/2015   Psoriasis 04/14/2015   Emphysema of lung (HCC) 11/26/2014   Radiation-induced esophageal stricture  10/01/2014   DNR (do not resuscitate) discussion 08/21/2014   Fatigue 07/18/2014   Neuropathy due to chemotherapeutic drug (HCC) 07/18/2014   Muscle cramping 07/18/2014   SOB (shortness of breath) 04/28/2014   Radiation esophagitis 04/10/2014   Cancer of middle lobe of lung (HCC) 01/17/2014   Malignant neoplasm of right upper lobe of lung (HCC) 11/28/2013    Past Medical History:  Diagnosis Date   Cancer (HCC) dx'd 09/2013   Lung ca   Concussion 2018   Diabetes (HCC) 09/08/2016   type I diabetic patient reported on 09/13/16    Diabetes mellitus    insulin   Diabetic neuropathy (HCC) 09/08/2016   Fall 2018   GERD (gastroesophageal reflux disease)    no meds for   History of colitis    History of hiatal hernia    Hx of radiation therapy 12/16/13-01/30/14   lung 66Gy   Hypertension    Malignant neoplasm of right upper lobe of lung (HCC) 11/28/2013   Neuropathy    Psoriasis    Psoriatic arthritis (HCC) 09/08/2016   Rheumatoid arthritis (HCC)    Shortness of breath dyspnea    with exertion    Family History  Problem Relation Age of Onset   Other Mother    Anuerysm Mother        brain   Other Father        failure to thrive   Hypertension Other    Stroke Other    Heart attack Other    Hemachromatosis Other        great grandfather   Rheum arthritis Other        both sets of grandparents and father   Migraines Neg Hx    Colon cancer Neg Hx    Colon polyps Neg Hx    Esophageal cancer Neg Hx    Pancreatic cancer Neg Hx    Stomach cancer Neg Hx    Liver disease Neg Hx    Past Surgical History:  Procedure Laterality Date   ABDOMINAL HYSTERECTOMY     ABDOMINAL SURGERY     BALLOON DILATION N/A 12/12/2014   Procedure: BALLOON DILATION;  Surgeon: Louis Meckel, MD;  Location: [facility] ENDOSCOPY;  Service: Endoscopy;  Laterality: N/A;   BIOPSY  01/20/2020   Procedure: BIOPSY;  Surgeon: Sherrilyn Rist, MD;  Location: [facility];  Service: Gastroenterology;;   CHOLECYSTECTOMY      COLONOSCOPY N/A 02/07/2016   Procedure: COLONOSCOPY;  Surgeon: Sherrilyn Rist, MD;  Location[facility] ENDOSCOPY;  Service: Endoscopy;  Laterality: N/A;   DILATION AND CURETTAGE OF UTERUS     ESOPHAGOGASTRODUODENOSCOPY N/A 12/12/2014   Procedure: ESOPHAGOGASTRODUODENOSCOPY (EGD);  Surgeon: Louis Meckel, MD;  Location: [facility];  Service: Endoscopy;  Laterality: N/A;  with dilation   ESOPHAGOGASTRODUODENOSCOPY (EGD) WITH PROPOFOL N/A 10/09/2014   Procedure: ESOPHAGOGASTRODUODENOSCOPY (EGD) WITH PROPOFOL;  Surgeon: Louis Meckel, MD;  Location: [facility] ENDOSCOPY;  Service: Endoscopy;  Laterality: N/A;   ESOPHAGOGASTRODUODENOSCOPY (EGD) WITH PROPOFOL N/A 09/19/2016   Procedure: ESOPHAGOGASTRODUODENOSCOPY (EGD) WITH PROPOFOL;  Surgeon: Sherrilyn Rist, MD;  Location: [facility];  Service: Gastroenterology;  Laterality: N/A;  with savary dil tt  ESOPHAGOGASTRODUODENOSCOPY (EGD) WITH PROPOFOL N/A 01/20/2020   Procedure: ESOPHAGOGASTRODUODENOSCOPY (EGD) WITH PROPOFOL;  Surgeon: Sherrilyn Rist, MD;  Location: [facility] ENDOSCOPY;  Service: Gastroenterology;  Laterality: N/A;  Fluoro-Savary   ORIF ANKLE FRACTURE Right 07/04/2015   Procedure: OPEN REDUCTION INTERNAL FIXATION (ORIF) ANKLE FRACTURE;  Surgeon: Nadara Mustard, MD;  Location: [facility] OR;  Service: Orthopedics;  Laterality: Right;  OPEN REDUCTION, INTERNAL FIXATION OF RIGHT ANKLE FRACTURE.    ORIF ANKLE FRACTURE Left 02/10/2016   Procedure: OPEN REDUCTION INTERNAL FIXATION (ORIF) ANKLE FRACTURE;  Surgeon: Nadara Mustard, MD;  Location: [facility] OR;  Service: Orthopedics;  Laterality: Left;   SAVORY DILATION N/A 10/09/2014   Procedure: SAVORY DILATION;  Surgeon: Louis Meckel, MD;  Location: [facility] ENDOSCOPY;  Service: Endoscopy;  Laterality: N/A;   SAVORY DILATION N/A 09/19/2016   Procedure: SAVORY DILATION;  Surgeon: Sherrilyn Rist, MD;  Location: [facility];  Service: Gastroenterology;  Laterality: N/A;   SAVORY DILATION N/A 01/20/2020   Procedure:  SAVORY DILATION;  Surgeon: Sherrilyn Rist, MD;  Location: [facility] ENDOSCOPY;  Service: Gastroenterology;  Laterality: N/A;   TONSILLECTOMY     Social History   Social History Narrative   Patient lives at home. She has an old friend who is her caregiver, Madelaine Bhat, who stays with her 24/7.   Caffeine Use: 1 cups daily   Right handed   Immunization History  Administered Date(s) Administered   Fluad Quad(high Dose 65+) 07/31/2019, 10/01/2020, 07/15/2022   Influenza, High Dose Seasonal PF 08/12/2016, 08/30/2017, 09/07/2018, 08/24/2021   Influenza,inj,Quad PF,6+ Mos 11/07/2013, 07/14/2014, 08/25/2015   Influenza-Unspecified 08/07/2018, 09/07/2018   PFIZER(Purple Top)SARS-COV-2 Vaccination 11/28/2019, 12/25/2019, 06/18/2020   PPD Test 03/02/2016   Pfizer Covid-19 Vaccine Bivalent Booster 46yrs & up 07/27/2021   Pfizer(Comirnaty)Fall Seasonal Vaccine 12 years and older 08/17/2023   Pneumococcal Conjugate-13 05/10/2019   Pneumococcal Polysaccharide-23 03/19/2018   Pneumococcal-Unspecified 12/02/2015   Tdap 01/11/2017   Zoster Recombinant(Shingrix) 02/14/2018, 08/07/2018     Objective: Vital Signs: BP 138/77 (BP Location: Left Arm, Patient Position: Sitting, Cuff Size: Normal)   Pulse (!) 106   Resp 17   Ht 5' 5.5" (1.664 m)   Wt 185 lb (83.9 kg)   BMI 30.32 kg/m    Physical Exam Vitals and nursing note reviewed.  Constitutional:      Appearance: She is well-developed.  HENT:     Head: Normocephalic and atraumatic.  Eyes:     Conjunctiva/sclera: Conjunctivae normal.  Cardiovascular:     Rate and Rhythm: Normal rate and regular rhythm.     Heart sounds: Normal heart sounds.  Pulmonary:     Effort: Pulmonary effort is normal.     Breath sounds: Normal breath sounds.  Abdominal:     General: Bowel sounds are normal.     Palpations: Abdomen is soft.  Musculoskeletal:     Cervical back: Normal range of motion.  Lymphadenopathy:     Cervical: No cervical adenopathy.   Skin:    General: Skin is warm and dry.     Capillary Refill: Capillary refill takes less than 2 seconds.  Neurological:     Mental Status: She is alert and oriented to person, place, and time.  Psychiatric:        Behavior: Behavior normal.      Musculoskeletal Exam: C-spine has good range of motion.  Thoracic kyphosis noted.  No midline spinal tenderness.  Shoulder joints, elbow joints, wrist joints, MCPs, PIPs, DIPs have good range of  motion with no synovitis.  CMC, PIP, DIP thickening consistent with osteoarthritis of both hands.  Hip joints have slightly limited range of motion.  Knee joints have good range of motion with no warmth or effusion.  Ankle joints have good range of motion with no tenderness or joint swelling.  Pes planus noted bilaterally.  CDAI Exam: CDAI Score: -- Patient Global: --; Provider Global: -- Swollen: --; Tender: -- Joint Exam 12/13/2023   No joint exam has been documented for this visit   There is currently no information documented on the homunculus. Go to the Rheumatology activity and complete the homunculus joint exam.  Investigation: No additional findings.  Imaging: No results found.  Recent Labs: Lab Results  Component Value Date   WBC 11.1 (H) 09/13/2023   HGB 14.1 09/13/2023   PLT 242 09/13/2023   NA 139 09/13/2023   K 3.5 09/13/2023   CL 101 09/13/2023   CO2 29 09/13/2023   GLUCOSE 131 (H) 09/13/2023   BUN 21 09/13/2023   CREATININE 1.00 (H) 09/13/2023   BILITOT 0.6 09/13/2023   ALKPHOS 111 06/14/2023   AST 15 09/13/2023   ALT 14 09/13/2023   PROT 6.6 09/13/2023   ALBUMIN 4.1 06/14/2023   CALCIUM 9.2 09/13/2023   GFRAA 56 (L) 02/03/2020   QFTBGOLDPLUS NEGATIVE 07/12/2023    Speciality Comments: Remicade-anaphylaxis  Procedures:  No procedures performed Allergies: Carboplatin, Remicade [infliximab], Other, Rifampin, and Hydromorphone   Assessment / Plan:     Visit Diagnoses: Psoriatic arthropathy (HCC) - initially  diagnosed by Dr. Phylliss Bob in 2004.  She was initially treated with IV Remicade, switched to Humira 2010-anaphylactic IV Remicade.Humira d/c 2015 after being diagnosed with lung cancer--underwent chemotherapy and radiation therapy.  Patient used sulfasalazine intermittently in 2018.  She was then prescribed Otezla by Dr. Doreen Beam in 2018 with an adequate response. Patient has noticed a 95% improvement in her symptoms since initiating Skyrizi on 07/25/2023.  She has been tolerating Skyrizi without any side effects or injection site reactions.  She has noticed some increased weight gain and is unsure if it is related to the use of Skyrizi.  Her psoriasis has almost completely cleared.  She has no synovitis or dactylitis on examination today.  No evidence of Achilles tendinitis or plantar fasciitis.  Patient will remain on Skyrizi as prescribed.  She will follow-up in the office in 5 months or sooner if needed.  Psoriasis: She has noticed a 95% improvement in skin color urine since initiating Skyrizi.  She is no longer needing to use topical agents.  No medication changes will be made at this time.  High risk medication use - Skyrizi 150 mg sq injections every 12 weeks--Skyrizi was initiated on 07/25/23.  Previous therapy: Otezla, remicade, and humira  CBC and CMP updated on 09/13/23.  Orders for CBC and CMP released today.  Her next lab work will be due in May and every 3 months to monitor for drug toxicity TB gold negative on 07/12/23. Discussed the importance of holding skyrizi if she develops signs or symptoms of an infection and to resume once the infection has completely cleared.  - Plan: CBC with Differential/Platelet, COMPLETE METABOLIC PANEL WITH GFR  Primary osteoarthritis of both hands: CMC, PIP, DIP thickening consistent with osteoarthritis of both hands.  No synovitis or dactylitis noted.  She experiences intermittent discomfort in both hands but has no active inflammation at this time.  Pes planus  of both feet - Dr. Lucilla Lame consistent with inflammatory arthritis and  osteoarthritis overlap.  Previous surgeries performed by Dr. Lajoyce Corners. Pes planus noted bilaterally.   Primary osteoarthritis of both knees - Cortisone injection performed by Dr. Denyse Amass in July 2024.  She experiences intermittent discomfort in the left knee and uses an Ace wrap for support at times.  She has no warmth or effusion on examination today.  Chronic midline low back pain without sciatica: She experiences intermittent discomfort in her lower back.  No symptoms of radiculopathy at this time.  Other medical conditions are listed as follows:  LADA (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), managed as type 1 (HCC)  Malignant neoplasm of right upper lobe of lung (HCC) - diagnosed by Dr. Shirline Frees in 2015.  She was treated with chemotherapy and radiation therapy.  Patient states she did not finish chemotherapy.  Radiation-induced esophageal stricture  History of esophagitis  Neuropathy due to chemotherapeutic drug (HCC)  History of hypertension: Blood pressure was 138/77 today in the office.  RLS (restless legs syndrome)  History of depression  Aortic atherosclerosis (HCC)  History of subarachnoid hemorrhage  Former smoker  Family history of psoriasis in maternal grandmother  Orders: Orders Placed This Encounter  Procedures   CBC with Differential/Platelet   COMPLETE METABOLIC PANEL WITH GFR   No orders of the defined types were placed in this encounter.  Follow-Up Instructions: Return in about 5 months (around 05/11/2024) for Psoriatic arthritis, Osteoarthritis.   Gearldine Bienenstock, PA-C  Note - This record has been created using Dragon software.  Chart creation errors have been sought, but may not always  have been located. Such creation errors do not reflect on  the standard of medical care.

## 2023-12-01 ENCOUNTER — Other Ambulatory Visit: Payer: Self-pay | Admitting: Neurology

## 2023-12-05 ENCOUNTER — Telehealth: Payer: Self-pay | Admitting: Neurology

## 2023-12-05 MED ORDER — DULOXETINE HCL 60 MG PO CPEP: 60.0000 mg | ORAL_CAPSULE | Freq: Two times a day (BID) | ORAL | 1 refills | Status: DC

## 2023-12-05 NOTE — Telephone Encounter (Signed)
Yes, I would be happy to refill.   Prescription sent to pharmacy.

## 2023-12-05 NOTE — Addendum Note (Signed)
Addended by: Pincus Sanes on: 12/05/2023 01:00 PM   Modules accepted: Orders

## 2023-12-05 NOTE — Telephone Encounter (Signed)
I adore this patient. But I have not seen her in over 2 years  Dr. Lawerance Bach, can you refill her meds for her? Let me know, thank you !  Pod 4: let patient know I haven't seen her in years but I am reaching out to Dr. Lawerance Bach thanks

## 2023-12-05 NOTE — Telephone Encounter (Signed)
 Patient left voicemail stating Dr. Ines you prescribed Duloxetine  for me and [facility] refilled it the other day, but only gave me 1 bottle of 60 caps. I need to have more, but I have no refills. I want more than 60 at a time, just like the old days the way you used to prescribe it, please do that and I miss you Dr. Ines Patient would like a call back to discuss please.

## 2023-12-05 NOTE — Telephone Encounter (Signed)
Returned pt's call and LVM (Ok per [facility]) advising that Dr Lucia Gaskins asked Dr Lawerance Bach to takeover the Cymbalta prescription. Six months' worth of refills were sent by Dr [facility] to [facility] [address]. If any questions, she can call Dr Lawerance Bach or [place].

## 2023-12-11 DIAGNOSIS — N390 Urinary tract infection, site not specified: Secondary | ICD-10-CM | POA: Diagnosis not present

## 2023-12-13 ENCOUNTER — Ambulatory Visit: Payer: Medicare PPO | Attending: Physician Assistant | Admitting: Physician Assistant

## 2023-12-13 ENCOUNTER — Encounter: Payer: Self-pay | Admitting: Physician Assistant

## 2023-12-13 VITALS — BP 138/77 | HR 106 | Resp 17 | Ht 65.5 in | Wt 185.0 lb

## 2023-12-13 DIAGNOSIS — K222 Esophageal obstruction: Secondary | ICD-10-CM

## 2023-12-13 DIAGNOSIS — L4050 Arthropathic psoriasis, unspecified: Secondary | ICD-10-CM | POA: Diagnosis not present

## 2023-12-13 DIAGNOSIS — M2142 Flat foot [pes planus] (acquired), left foot: Secondary | ICD-10-CM

## 2023-12-13 DIAGNOSIS — M19041 Primary osteoarthritis, right hand: Secondary | ICD-10-CM

## 2023-12-13 DIAGNOSIS — Z840 Family history of diseases of the skin and subcutaneous tissue: Secondary | ICD-10-CM

## 2023-12-13 DIAGNOSIS — E139 Other specified diabetes mellitus without complications: Secondary | ICD-10-CM | POA: Diagnosis not present

## 2023-12-13 DIAGNOSIS — T451X5A Adverse effect of antineoplastic and immunosuppressive drugs, initial encounter: Secondary | ICD-10-CM

## 2023-12-13 DIAGNOSIS — Z79899 Other long term (current) drug therapy: Secondary | ICD-10-CM

## 2023-12-13 DIAGNOSIS — M545 Low back pain, unspecified: Secondary | ICD-10-CM | POA: Diagnosis not present

## 2023-12-13 DIAGNOSIS — M170 Bilateral primary osteoarthritis of knee: Secondary | ICD-10-CM

## 2023-12-13 DIAGNOSIS — Z8679 Personal history of other diseases of the circulatory system: Secondary | ICD-10-CM

## 2023-12-13 DIAGNOSIS — M19042 Primary osteoarthritis, left hand: Secondary | ICD-10-CM

## 2023-12-13 DIAGNOSIS — G620 Drug-induced polyneuropathy: Secondary | ICD-10-CM

## 2023-12-13 DIAGNOSIS — L409 Psoriasis, unspecified: Secondary | ICD-10-CM | POA: Diagnosis not present

## 2023-12-13 DIAGNOSIS — G8929 Other chronic pain: Secondary | ICD-10-CM

## 2023-12-13 DIAGNOSIS — G2581 Restless legs syndrome: Secondary | ICD-10-CM

## 2023-12-13 DIAGNOSIS — M2141 Flat foot [pes planus] (acquired), right foot: Secondary | ICD-10-CM | POA: Diagnosis not present

## 2023-12-13 DIAGNOSIS — Z87891 Personal history of nicotine dependence: Secondary | ICD-10-CM

## 2023-12-13 DIAGNOSIS — Z8659 Personal history of other mental and behavioral disorders: Secondary | ICD-10-CM

## 2023-12-13 DIAGNOSIS — C3411 Malignant neoplasm of upper lobe, right bronchus or lung: Secondary | ICD-10-CM

## 2023-12-13 DIAGNOSIS — I700 Atherosclerosis of aorta: Secondary | ICD-10-CM

## 2023-12-13 DIAGNOSIS — Z8719 Personal history of other diseases of the digestive system: Secondary | ICD-10-CM

## 2023-12-13 NOTE — Patient Instructions (Signed)
Standing Labs We placed an order today for your standing lab work.   Please have your standing labs drawn in May and every 3 months   Please have your labs drawn 2 weeks prior to your appointment so that the provider can discuss your lab results at your appointment, if possible.  Please note that you may see your imaging and lab results in MyChart before we have reviewed them. We will contact you once all results are reviewed. Please allow our office up to 72 hours to thoroughly review all of the results before contacting the office for clarification of your results.  WALK-IN LAB HOURS  Monday through Thursday from 8:00 am -12:30 pm and 1:00 pm-5:00 pm and Friday from 8:00 am-12:00 pm.  Patients with office visits requiring labs will be seen before walk-in labs.  You may encounter longer than normal wait times. Please allow additional time. Wait times may be shorter on  Monday and Thursday afternoons.  We do not book appointments for walk-in labs. We appreciate your patience and understanding with our staff.   Labs are drawn by [facility]. Please bring your co-pay at the time of your lab draw.  You may receive a bill from [facility] for your lab work.  Please note if you are on Hydroxychloroquine and and an order has been placed for a Hydroxychloroquine level,  you will need to have it drawn 4 hours or more after your last dose.  If you wish to have your labs drawn at another location, please call the office 24 hours in advance so we can fax the orders.  The office is located at [address], [address], [place], [place] [address]   If you have any questions regarding directions or hours of operation,  please call [phone].   As a reminder, please drink plenty of water prior to coming for your lab work. Thanks!

## 2023-12-14 LAB — CBC WITH DIFFERENTIAL/PLATELET
Absolute Lymphocytes: 2180 {cells}/uL (ref 850–3900)
Absolute Monocytes: 698 {cells}/uL (ref 200–950)
Basophils Absolute: 87 {cells}/uL (ref 0–200)
Basophils Relative: 0.8 %
Eosinophils Absolute: 491 {cells}/uL (ref 15–500)
Eosinophils Relative: 4.5 %
HCT: 43.4 % (ref 35.0–45.0)
Hemoglobin: 13.8 g/dL (ref 11.7–15.5)
MCH: 27.9 pg (ref 27.0–33.0)
MCHC: 31.8 g/dL — ABNORMAL LOW (ref 32.0–36.0)
MCV: 87.9 fL (ref 80.0–100.0)
MPV: 10.1 fL (ref 7.5–12.5)
Monocytes Relative: 6.4 %
Neutro Abs: 7445 {cells}/uL (ref 1500–7800)
Neutrophils Relative %: 68.3 %
Platelets: 231 10*3/uL (ref 140–400)
RBC: 4.94 10*6/uL (ref 3.80–5.10)
RDW: 14.3 % (ref 11.0–15.0)
Total Lymphocyte: 20 %
WBC: 10.9 10*3/uL — ABNORMAL HIGH (ref 3.8–10.8)

## 2023-12-14 LAB — COMPLETE METABOLIC PANEL WITH GFR
AG Ratio: 1.5 (calc) (ref 1.0–2.5)
ALT: 13 U/L (ref 6–29)
AST: 17 U/L (ref 10–35)
Albumin: 4 g/dL (ref 3.6–5.1)
Alkaline phosphatase (APISO): 109 U/L (ref 37–153)
BUN/Creatinine Ratio: 24 (calc) — ABNORMAL HIGH (ref 6–22)
BUN: 23 mg/dL (ref 7–25)
CO2: 31 mmol/L (ref 20–32)
Calcium: 9.3 mg/dL (ref 8.6–10.4)
Chloride: 101 mmol/L (ref 98–110)
Creat: 0.97 mg/dL — ABNORMAL HIGH (ref 0.60–0.95)
Globulin: 2.6 g/dL (ref 1.9–3.7)
Glucose, Bld: 122 mg/dL — ABNORMAL HIGH (ref 65–99)
Potassium: 3.7 mmol/L (ref 3.5–5.3)
Sodium: 140 mmol/L (ref 135–146)
Total Bilirubin: 0.5 mg/dL (ref 0.2–1.2)
Total Protein: 6.6 g/dL (ref 6.1–8.1)
eGFR: 58 mL/min/{1.73_m2} — ABNORMAL LOW (ref >=60)

## 2023-12-14 NOTE — Progress Notes (Signed)
Creatinine remains borderline elevated but has improved. GFR remains slightly low-58-stable.  Rest of CMP stable. Cbc stable.

## 2023-12-18 ENCOUNTER — Other Ambulatory Visit: Payer: Self-pay | Admitting: Internal Medicine

## 2023-12-19 ENCOUNTER — Other Ambulatory Visit: Payer: Self-pay | Admitting: Internal Medicine

## 2023-12-19 NOTE — Telephone Encounter (Signed)
Called [employer] for update on Skyrizi SQ PAP application. Per rep, they reached out to patient on 12/07/2023. Per rep, the documents that were submitted by patient were not accepted as proof of income. Patient can submit a 1040 tax return or 1099 or Social Security Award Letter. She also needs to call [employer] and confirm household size and how many people at home are 82 y/o and over  ATC patient - left VM advising of above. Recommended she call [employer] for what information is needed  Phone# [phone]  Chesley Mires, PharmD, MPH, BCPS, CPP Clinical Pharmacist (Rheumatology and Pulmonology)

## 2023-12-20 ENCOUNTER — Ambulatory Visit: Payer: Medicare PPO | Admitting: Internal Medicine

## 2023-12-25 ENCOUNTER — Other Ambulatory Visit (HOSPITAL_COMMUNITY): Payer: Self-pay

## 2023-12-25 NOTE — Telephone Encounter (Signed)
 Reached back out to patient about income documents needed for [employer]. She states she will file taxes and reach out to clinic once done. I advise that this is time-sensitive as Denyse Amass will close case within 3 months if no action taken.  Please do not provide additional Skyrizi samples to patient as she needs to complete this step.  Chesley Mires, PharmD, MPH, BCPS, CPP Clinical Pharmacist (Rheumatology and Pulmonology)

## 2023-12-27 NOTE — Patient Instructions (Addendum)
      Blood work was ordered.       Medications changes include :   None    A referral was ordered and someone will call you to schedule an appointment.     Return in about 6 months (around 06/26/2024) for Physical Exam.

## 2023-12-27 NOTE — Progress Notes (Unsigned)
 Subjective:    Patient ID: Megan [name], female    DOB: 02/21/1941, 83 y.o.   MRN: [id number]     HPI Megan [name] is here for follow up of her chronic medical problems.  Has been gaining weight  - since moving to [place] and since starting skyrizi.  She is not eating sugary foods and not eating desserts.   May get in a 1/2 mile a day of walking.  She gets winded easily and has low stamina    Medications and allergies reviewed with patient and updated if appropriate.  Current Outpatient Medications on File Prior to Visit  Medication Sig Dispense Refill   ACCU-CHEK GUIDE TEST test strip      albuterol (VENTOLIN HFA) 108 (90 Base) MCG/ACT inhaler Inhale 2 puffs into the lungs every 6 (six) hours as needed for wheezing or shortness of breath. 1 each 2   amLODipine (NORVASC) 5 MG tablet TAKE 1 TABLET BY MOUTH EVERY DAY 90 tablet 3   B-D UF III MINI PEN NEEDLES 31G X 5 MM MISC USE AS DIRECTED WITH INSULIN 4 TIMES A DAY 100 each 3   budesonide-formoterol (SYMBICORT) 80-4.5 MCG/ACT inhaler Inhale 2 puffs into the lungs 2 (two) times daily. (Patient taking differently: Inhale 2 puffs into the lungs as needed.) 1 each 6   Continuous Blood Gluc Sensor (DEXCOM G6 SENSOR) MISC Apply new sensor every 10 days for continuous glucose monitoring. 9 each 3   Continuous Blood Gluc Transmit (DEXCOM G6 TRANSMITTER) MISC Use one transmitter every 3 months for continuous glucose monitoring. 1 each 0   DULoxetine (CYMBALTA) 60 MG capsule Take 1 capsule (60 mg total) by mouth 2 (two) times daily. 180 capsule 1   GVOKE HYPOPEN 1-PACK 1 MG/0.2ML SOAJ Inject under skin 0.2 ml as needed for hypoglycemia 0.2 mL prn   insulin aspart (NOVOLOG FLEXPEN) 100 UNIT/ML FlexPen INJECT 18-22 UNITS UNDER THE SKIN THREE TIMES DAILY 45 mL 3   insulin degludec (TRESIBA FLEXTOUCH) 100 UNIT/ML FlexTouch Pen Inject up to 36-38 units into the skin daily 30 mL 3   pravastatin (PRAVACHOL) 40 MG tablet TAKE 1 TABLET(40  MG) BY MOUTH EVERY EVENING 90 tablet 1   risankizumab-rzaa (SKYRIZI PEN) 150 MG/ML pen Inject 150mg  into the skin at Week 4. 1 mL 0   No current facility-administered medications on file prior to visit.     Review of Systems  Constitutional:  Negative for fever.  Respiratory:  Positive for cough (occ), shortness of breath (chronic) and wheezing.   Cardiovascular:  Positive for leg swelling (rght leg - chronic). Negative for chest pain and palpitations.  Neurological:  Positive for light-headedness (occ). Negative for headaches.       Objective:   Vitals:   12/28/23 0828  BP: 122/74  Pulse: (!) 106  Temp: 97.9 F (36.6 C)  SpO2: 96%   BP Readings from Last 3 Encounters:  12/28/23 122/74  12/13/23 138/77  09/13/23 115/80   Wt Readings from Last 3 Encounters:  12/28/23 183 lb 9.6 oz (83.3 kg)  12/13/23 185 lb (83.9 kg)  09/13/23 179 lb (81.2 kg)   Body mass index is 30.09 kg/m.    Physical Exam     Lab Results  Component Value Date   WBC 10.9 (H) 12/13/2023   HGB 13.8 12/13/2023   HCT 43.4 12/13/2023   PLT 231 12/13/2023   GLUCOSE 122 (H) 12/13/2023   CHOL 170 06/14/2023   TRIG 171.0 (H) 06/14/2023  HDL 47.50 06/14/2023   LDLDIRECT 156.0 05/10/2019   LDLCALC 88 06/14/2023   ALT 13 12/13/2023   AST 17 12/13/2023   NA 140 12/13/2023   K 3.7 12/13/2023   CL 101 12/13/2023   CREATININE 0.97 (H) 12/13/2023   BUN 23 12/13/2023   CO2 31 12/13/2023   TSH 2.47 06/14/2023   INR 1.17 10/10/2018   HGBA1C 7.8 (H) 06/14/2023   MICROALBUR 1.3 06/14/2023     Assessment & Plan:    See Problem List for Assessment and Plan of chronic medical problems.

## 2023-12-28 ENCOUNTER — Encounter: Payer: Self-pay | Admitting: Internal Medicine

## 2023-12-28 ENCOUNTER — Ambulatory Visit: Payer: Medicare PPO | Admitting: Internal Medicine

## 2023-12-28 VITALS — BP 122/74 | HR 106 | Temp 97.9°F | Ht 65.5 in | Wt 183.6 lb

## 2023-12-28 DIAGNOSIS — J4530 Mild persistent asthma, uncomplicated: Secondary | ICD-10-CM

## 2023-12-28 DIAGNOSIS — E782 Mixed hyperlipidemia: Secondary | ICD-10-CM

## 2023-12-28 DIAGNOSIS — E559 Vitamin D deficiency, unspecified: Secondary | ICD-10-CM

## 2023-12-28 DIAGNOSIS — T451X5A Adverse effect of antineoplastic and immunosuppressive drugs, initial encounter: Secondary | ICD-10-CM

## 2023-12-28 DIAGNOSIS — R635 Abnormal weight gain: Secondary | ICD-10-CM

## 2023-12-28 DIAGNOSIS — I10 Essential (primary) hypertension: Secondary | ICD-10-CM

## 2023-12-28 DIAGNOSIS — E538 Deficiency of other specified B group vitamins: Secondary | ICD-10-CM

## 2023-12-28 DIAGNOSIS — E139 Other specified diabetes mellitus without complications: Secondary | ICD-10-CM | POA: Diagnosis not present

## 2023-12-28 DIAGNOSIS — L4050 Arthropathic psoriasis, unspecified: Secondary | ICD-10-CM | POA: Diagnosis not present

## 2023-12-28 DIAGNOSIS — G620 Drug-induced polyneuropathy: Secondary | ICD-10-CM | POA: Diagnosis not present

## 2023-12-28 LAB — HEMOGLOBIN A1C: Hgb A1c MFr Bld: 9.1 % — ABNORMAL HIGH (ref 4.6–6.5)

## 2023-12-28 LAB — LIPID PANEL
Cholesterol: 150 mg/dL (ref 0–200)
HDL: 49 mg/dL (ref >39.00)
LDL Cholesterol: 79 mg/dL (ref 0–99)
NonHDL: 101.2
Total CHOL/HDL Ratio: 3
Triglycerides: 111 mg/dL (ref 0.0–149.0)
VLDL: 22.2 mg/dL (ref 0.0–40.0)

## 2023-12-28 LAB — VITAMIN B12: Vitamin B-12: 258 pg/mL (ref 211–911)

## 2023-12-28 LAB — TSH: TSH: 5.87 u[IU]/mL — ABNORMAL HIGH (ref 0.35–5.50)

## 2023-12-28 LAB — VITAMIN D 25 HYDROXY (VIT D DEFICIENCY, FRACTURES): VITD: 38.46 ng/mL (ref 30.00–100.00)

## 2023-12-28 MED ORDER — BUDESONIDE-FORMOTEROL FUMARATE 80-4.5 MCG/ACT IN AERO: 2.0000 | INHALATION_SPRAY | RESPIRATORY_TRACT | 5 refills | Status: DC | PRN

## 2023-12-28 NOTE — Assessment & Plan Note (Signed)
 Chronic Management per rheumatology On [employer]

## 2023-12-28 NOTE — Assessment & Plan Note (Signed)
Chronic Check vitamin B12 level 

## 2023-12-28 NOTE — Assessment & Plan Note (Signed)
 Chronic No longer seeing neurology Continue cymbalta 60 mg bid

## 2023-12-28 NOTE — Assessment & Plan Note (Signed)
 Chronic Taking vitamin D daily Check vitamin D level

## 2023-12-28 NOTE — Assessment & Plan Note (Signed)
Chronic Regular exercise and healthy diet encouraged Check lipid panel  Continue pravastatin 40 mg daily 

## 2023-12-28 NOTE — Assessment & Plan Note (Signed)
 Chronic Blood pressure well controlled CMP, cbc done recently-reviewed Continue amlodipine 5 mg daily

## 2023-12-28 NOTE — Assessment & Plan Note (Signed)
 Chronic Management per endocrine-following with Dennie Maizes Check A1c today

## 2023-12-29 DIAGNOSIS — R635 Abnormal weight gain: Secondary | ICD-10-CM | POA: Insufficient documentation

## 2023-12-29 NOTE — Assessment & Plan Note (Signed)
 Has had some weight gain and she is not exactly sure why She is fairly sedentary but that has not changed Likely consuming more calories than she realizes Will check TFTs Encouraged her to increase activity Will discuss GLP-1 for better with endocrine

## 2023-12-29 NOTE — Assessment & Plan Note (Signed)
 Chronic controlled Start Symbicort inhaler twice daily every day-reminded to rinse after use Continue albuterol as needed - refilled

## 2024-01-08 ENCOUNTER — Telehealth: Payer: Self-pay | Admitting: Internal Medicine

## 2024-01-08 NOTE — Telephone Encounter (Signed)
 LM for pt with results as requested. Provided office number for questions/concerns.

## 2024-01-08 NOTE — Telephone Encounter (Signed)
 Copied from CRM [id number]. Topic: Clinical - Lab/Test Results >> Jan 08, 2024  9:26 AM Kathryne Eriksson wrote: Reason for CRM: Lab Results >> Jan 08, 2024  9:28 AM Kathryne Eriksson wrote: [phone] "Camay" states she missed a call from the lab in regards to her lab results, patient is requesting a call back. Patient states if she doesn't answer it's okay to leave a detailed message on her voicemail.

## 2024-01-09 DIAGNOSIS — E118 Type 2 diabetes mellitus with unspecified complications: Secondary | ICD-10-CM | POA: Diagnosis not present

## 2024-01-12 DIAGNOSIS — R3915 Urgency of urination: Secondary | ICD-10-CM | POA: Diagnosis not present

## 2024-01-16 DIAGNOSIS — E782 Mixed hyperlipidemia: Secondary | ICD-10-CM | POA: Diagnosis not present

## 2024-01-16 DIAGNOSIS — E139 Other specified diabetes mellitus without complications: Secondary | ICD-10-CM | POA: Diagnosis not present

## 2024-01-16 DIAGNOSIS — I10 Essential (primary) hypertension: Secondary | ICD-10-CM | POA: Diagnosis not present

## 2024-01-23 DIAGNOSIS — E1140 Type 2 diabetes mellitus with diabetic neuropathy, unspecified: Secondary | ICD-10-CM | POA: Diagnosis not present

## 2024-02-06 DIAGNOSIS — R399 Unspecified symptoms and signs involving the genitourinary system: Secondary | ICD-10-CM | POA: Diagnosis not present

## 2024-02-14 ENCOUNTER — Encounter (HOSPITAL_COMMUNITY): Payer: Self-pay

## 2024-02-14 ENCOUNTER — Ambulatory Visit (HOSPITAL_COMMUNITY)
Admission: RE | Admit: 2024-02-14 | Discharge: 2024-02-14 | Disposition: A | Discharge status: Home / Self Care | Payer: Medicare PPO | Source: Ambulatory Visit | Attending: Internal Medicine | Admitting: Internal Medicine

## 2024-02-14 ENCOUNTER — Inpatient Hospital Stay: Payer: Medicare PPO | Attending: Internal Medicine

## 2024-02-14 DIAGNOSIS — L821 Other seborrheic keratosis: Secondary | ICD-10-CM | POA: Diagnosis not present

## 2024-02-14 DIAGNOSIS — J90 Pleural effusion, not elsewhere classified: Secondary | ICD-10-CM | POA: Diagnosis not present

## 2024-02-14 DIAGNOSIS — C3490 Malignant neoplasm of unspecified part of unspecified bronchus or lung: Secondary | ICD-10-CM

## 2024-02-14 DIAGNOSIS — L820 Inflamed seborrheic keratosis: Secondary | ICD-10-CM | POA: Diagnosis not present

## 2024-02-14 DIAGNOSIS — L720 Epidermal cyst: Secondary | ICD-10-CM | POA: Diagnosis not present

## 2024-02-14 DIAGNOSIS — L814 Other melanin hyperpigmentation: Secondary | ICD-10-CM | POA: Diagnosis not present

## 2024-02-14 DIAGNOSIS — I700 Atherosclerosis of aorta: Secondary | ICD-10-CM | POA: Diagnosis not present

## 2024-02-14 LAB — CMP (CANCER CENTER ONLY)
ALT: 10 U/L (ref 0–44)
AST: 12 U/L — ABNORMAL LOW (ref 15–41)
Albumin: 3.8 g/dL (ref 3.5–5.0)
Alkaline Phosphatase: 110 U/L (ref 38–126)
Anion gap: 7 (ref 5–15)
BUN: 16 mg/dL (ref 8–23)
CO2: 30 mmol/L (ref 22–32)
Calcium: 8.7 mg/dL — ABNORMAL LOW (ref 8.9–10.3)
Chloride: 103 mmol/L (ref 98–111)
Creatinine: 0.91 mg/dL (ref 0.44–1.00)
GFR, Estimated: >60 mL/min (ref >60)
Glucose, Bld: 271 mg/dL — ABNORMAL HIGH (ref 70–99)
Potassium: 3.4 mmol/L — ABNORMAL LOW (ref 3.5–5.1)
Sodium: 140 mmol/L (ref 135–145)
Total Bilirubin: 0.6 mg/dL (ref 0.0–1.2)
Total Protein: 6.6 g/dL (ref 6.5–8.1)

## 2024-02-14 LAB — CBC WITH DIFFERENTIAL (CANCER CENTER ONLY)
Abs Immature Granulocytes: 0.11 10*3/uL — ABNORMAL HIGH (ref 0.00–0.07)
Basophils Absolute: 0.1 10*3/uL (ref 0.0–0.1)
Basophils Relative: 1 %
Eosinophils Absolute: 0.5 10*3/uL (ref 0.0–0.5)
Eosinophils Relative: 6 %
HCT: 40.7 % (ref 36.0–46.0)
Hemoglobin: 13.4 g/dL (ref 12.0–15.0)
Immature Granulocytes: 2 %
Lymphocytes Relative: 18 %
Lymphs Abs: 1.3 10*3/uL (ref 0.7–4.0)
MCH: 28.3 pg (ref 26.0–34.0)
MCHC: 32.9 g/dL (ref 30.0–36.0)
MCV: 85.9 fL (ref 80.0–100.0)
Monocytes Absolute: 0.5 10*3/uL (ref 0.1–1.0)
Monocytes Relative: 7 %
Neutro Abs: 4.8 10*3/uL (ref 1.7–7.7)
Neutrophils Relative %: 66 %
Platelet Count: 184 10*3/uL (ref 150–400)
RBC: 4.74 MIL/uL (ref 3.87–5.11)
RDW: 14.6 % (ref 11.5–15.5)
WBC Count: 7.3 10*3/uL (ref 4.0–10.5)
nRBC: 0 % (ref 0.0–0.2)

## 2024-02-14 MED ORDER — SODIUM CHLORIDE (PF) 0.9 % IJ SOLN
INTRAMUSCULAR | Status: AC
  Filled 2024-02-14: qty 50

## 2024-02-14 MED ORDER — IOHEXOL 300 MG/ML  SOLN
75.0000 mL | Freq: Once | INTRAMUSCULAR | Status: AC | PRN
  Administered 2024-02-14: 75 mL via INTRAVENOUS

## 2024-02-20 NOTE — Telephone Encounter (Addendum)
 Received income documents. Faxed to [employer] for Skyrizi  PAP  Geraldene Kleine, PharmD, MPH, BCPS, CPP Clinical Pharmacist (Rheumatology and Pulmonology)

## 2024-02-21 ENCOUNTER — Inpatient Hospital Stay: Payer: Medicare PPO | Admitting: Internal Medicine

## 2024-02-21 VITALS — BP 131/77 | HR 97 | Temp 97.6°F | Resp 18 | Ht 65.5 in | Wt 182.6 lb

## 2024-02-21 DIAGNOSIS — C3411 Malignant neoplasm of upper lobe, right bronchus or lung: Secondary | ICD-10-CM

## 2024-02-22 NOTE — Progress Notes (Signed)
 No show

## 2024-02-27 DIAGNOSIS — R399 Unspecified symptoms and signs involving the genitourinary system: Secondary | ICD-10-CM | POA: Diagnosis not present

## 2024-03-05 ENCOUNTER — Other Ambulatory Visit (HOSPITAL_COMMUNITY): Payer: Self-pay

## 2024-03-05 MED ORDER — SKYRIZI PEN 150 MG/ML ~~LOC~~ SOAJ
SUBCUTANEOUS | 0 refills | Status: DC
  Filled 2024-03-08: qty 1, 84d supply, fill #0

## 2024-03-05 NOTE — Addendum Note (Signed)
 Addended by: Thais Fill on: 03/05/2024 10:57 AM   Modules accepted: Orders

## 2024-03-05 NOTE — Telephone Encounter (Signed)
 Spoke with [employer] representative to discuss status of patient assistance application for Skyrizi . Per representative, patient's application was denied on 02/21/2024. Patient was informed of the denial and a denial letter was faxed to the office. They had received and reviewed the updated income documents provided prior to denial.   Called patient to discuss this decision. She was aware of the denial. I explained that without the patient assistance program her co-pay for Skyrizi  would be $300 ($100/month). However, she has an out-of-pocket maximum of $2000/year. She agreed to pay this price and will await further instructions from pharmacy.   Tolu Key Cen, PharmD [facility] PGY-1

## 2024-03-08 ENCOUNTER — Other Ambulatory Visit: Payer: Self-pay

## 2024-03-08 NOTE — Progress Notes (Addendum)
 Specialty Pharmacy Initial Fill Coordination Note  Megan [name] is a 84 y.o. female contacted today regarding initial fill of specialty medication(s) Risankizumab -rzaa (Skyrizi  Pen)   Patient requested Delivery   Delivery date: 03/12/24   Verified address: [address]   [place] [address]   Medication will be filled on 03/11/24.   Patient is aware of $300 copayment. Payment information collected and forwarded to [facility].  Pt has not enabled [employer] will need to be mailed to pt.

## 2024-03-08 NOTE — Progress Notes (Signed)
 Patient on Skyrizi  150mg  subcut every 12 weeks (on maintenance). Denied patient assistance application and will pay copay through insurance.  Geraldene Kleine, PharmD, MPH, BCPS, CPP Clinical Pharmacist (Rheumatology and Pulmonology)

## 2024-03-11 ENCOUNTER — Other Ambulatory Visit: Payer: Self-pay

## 2024-03-18 DIAGNOSIS — R399 Unspecified symptoms and signs involving the genitourinary system: Secondary | ICD-10-CM | POA: Diagnosis not present

## 2024-03-20 ENCOUNTER — Telehealth: Payer: Self-pay | Admitting: *Deleted

## 2024-03-20 NOTE — Telephone Encounter (Signed)
 Received call from pt stating she had an appt recently for lab & MD but waited 45" & had to leave due to another appt without seeing MD.  She asked for results of CT & lab.  Reviewed & informed that both looked OK-scan stable.  She wants to know if she has to r/s to see Dr Marguerita Shih or can she r/s for next year.  Message to Dr Mohamed/pod

## 2024-03-31 DIAGNOSIS — N390 Urinary tract infection, site not specified: Secondary | ICD-10-CM

## 2024-03-31 HISTORY — DX: Urinary tract infection, site not specified: N39.0

## 2024-04-03 ENCOUNTER — Ambulatory Visit: Admitting: Family Medicine

## 2024-04-03 ENCOUNTER — Encounter: Payer: Self-pay | Admitting: Family Medicine

## 2024-04-03 ENCOUNTER — Other Ambulatory Visit: Payer: Self-pay

## 2024-04-03 ENCOUNTER — Ambulatory Visit (INDEPENDENT_AMBULATORY_CARE_PROVIDER_SITE_OTHER)

## 2024-04-03 VITALS — BP 122/82 | HR 112 | Ht 65.5 in | Wt 179.0 lb

## 2024-04-03 DIAGNOSIS — M1712 Unilateral primary osteoarthritis, left knee: Secondary | ICD-10-CM

## 2024-04-03 DIAGNOSIS — G8929 Other chronic pain: Secondary | ICD-10-CM

## 2024-04-03 DIAGNOSIS — M25562 Pain in left knee: Secondary | ICD-10-CM

## 2024-04-03 NOTE — Patient Instructions (Addendum)
 Thank you for coming in today.   Please get an Xray today before you leave   You received an injection today. Seek immediate medical attention if the joint becomes red, extremely painful, or is oozing fluid.   See you back as needed. Let us  know ahead of time if they injection didn't help.

## 2024-04-03 NOTE — Progress Notes (Signed)
 I, Miquel Amen, CMA acting as a scribe for Garlan Juniper, MD.  DARCIA LAMPI is a 83 y.o. female who presents to [facility] today for exacerbation of her L knee pain. Pt was last seen by Dr. Alease Hunter on 04/06/23 and was given a L knee steroid injection.  Today, pt reports exacerbation of left knee pain over the past 6 weeks. Unable to ambulate 2 days ago. Denies visible swelling, instability, mechanical sx. Locates pain to medial aspect of the knee. Has tried compression with short-term relief. Takes Tylenol  Arthritis.   Dx imaging: 12/29/21 L knee  07/22/14 L knee XR  Pertinent review of systems: No fevers or chills  Relevant historical information: Diabetes.  Patient has a continuous glucometer and is well aware how to manage her blood sugar in response to hyperglycemia with boluses of short acting insulin . Emphysema.  History of lung cancer.  Radiation esophagitis and stricture.   Exam:  BP 122/82   Pulse (!) 112   Ht 5' 5.5" (1.664 m)   Wt 179 lb (81.2 kg)   SpO2 96%   BMI 29.33 kg/m  General: Well Developed, well nourished, and in no acute distress.   MSK: Left knee normal-appearing minimal swelling.  Tender palpation medial joint line.    Lab and Radiology Results  Procedure: Real-time Ultrasound Guided Injection of left knee joint superior lateral patella space Device: Philips Affiniti 50G/GE Logiq Images permanently stored and available for review in PACS Verbal informed consent obtained.  Discussed risks and benefits of procedure. Warned about infection, bleeding, hyperglycemia damage to structures among others. Patient expresses understanding and agreement Time-out conducted.   Noted no overlying erythema, induration, or other signs of local infection.   Skin prepped in a sterile fashion.   Local anesthesia: Topical Ethyl chloride.   With sterile technique and under real time ultrasound guidance: 40 mg of Kenalog and 2 mL of  Marcaine  injected into knee joint. Fluid seen entering the joint capsule.   Completed without difficulty   Pain immediately resolved suggesting accurate placement of the medication.   Advised to call if fevers/chills, erythema, induration, drainage, or persistent bleeding.   Images permanently stored and available for review in the ultrasound unit.  Impression: Technically successful ultrasound guided injection.   X-ray images left knee obtained today personally and independently interpreted. Mild to moderate medial DJD.  No acute fractures are visible. Await formal radiology review     Assessment and Plan: 83 y.o. female with left knee pain due to DJD exacerbation.  Plan for steroid injection today. Recheck as needed.  Consider Zilretta  or gel injections with advanced morning next time if this injection does not last very long.  PDMP not reviewed this encounter. Orders Placed This Encounter  Procedures   US  LIMITED JOINT SPACE STRUCTURES LOW LEFT(NO LINKED CHARGES)    Reason for Exam (SYMPTOM  OR DIAGNOSIS REQUIRED):   left knee pain    Preferred imaging location?:   [facility] K[facility] AP/LAT W/[place] Left    Standing Status:   Future    Number of Occurrences:   1    Expiration Date:   05/03/2024    Reason for Exam (SYMPTOM  OR DIAGNOSIS REQUIRED):   left knee pain    Preferred imaging location?:   [facility]   No orders of the defined types were placed in this encounter.    Discussed warning signs or symptoms. Please see discharge instructions.  Patient expresses understanding.   The above documentation has been reviewed and is accurate and complete Garlan Juniper, M.D.

## 2024-04-09 ENCOUNTER — Ambulatory Visit: Payer: Self-pay | Admitting: Family Medicine

## 2024-04-09 NOTE — Progress Notes (Signed)
 Left knee x-ray shows medium arthritis that looks a little worse from previous x-ray.

## 2024-04-11 DIAGNOSIS — R399 Unspecified symptoms and signs involving the genitourinary system: Secondary | ICD-10-CM | POA: Diagnosis not present

## 2024-04-17 DIAGNOSIS — E1065 Type 1 diabetes mellitus with hyperglycemia: Secondary | ICD-10-CM | POA: Diagnosis not present

## 2024-04-24 DIAGNOSIS — E1065 Type 1 diabetes mellitus with hyperglycemia: Secondary | ICD-10-CM | POA: Diagnosis not present

## 2024-04-29 DIAGNOSIS — R399 Unspecified symptoms and signs involving the genitourinary system: Secondary | ICD-10-CM | POA: Diagnosis not present

## 2024-04-29 DIAGNOSIS — E1065 Type 1 diabetes mellitus with hyperglycemia: Secondary | ICD-10-CM | POA: Diagnosis not present

## 2024-05-02 NOTE — Telephone Encounter (Signed)
 LVM for patient re: ABTX sent to pharmacy on file. Left main clinic number for return call with any questions. Electronically signed by: Chiquita Anette Marion, LPN 12/06/7972 8:60 PM

## 2024-05-08 NOTE — Progress Notes (Signed)
 Office Visit Note  Patient: Megan [name]             Date of Birth: 08-25-1941           MRN: [id number]             PCP: Geofm Glade PARAS, MD Referring: Geofm Glade PARAS, MD Visit Date: 05/22/2024 Occupation: @GUAROCC @  Subjective:  Medication monitoring  History of Present Illness: Megan [name] is a 83 y.o. female with psoriatic arthritis, psoriasis and osteoarthritis.  She returns today after her last visit in February 2025.  She states she has had recurrent UTIs since the last visit and finally the UTI resolved.  She has been having some discomfort in her right trochanteric and right SI joint per patient.  She also gets cortisone injection in her left knee joint by Dr. Joane.  She states that injections have been helpful.  She has not had a flare of psoriatic arthritis or psoriasis.  She is on Skyrizi  150 mg subcu every 12 weeks which she is tolerating well.    Activities of Daily Living:  Patient reports morning stiffness for  none.   Patient Denies nocturnal pain.  Difficulty dressing/grooming: Denies Difficulty climbing stairs: Denies Difficulty getting out of chair: Denies Difficulty using hands for taps, buttons, cutlery, and/or writing: Reports  Review of Systems  Constitutional:  Positive for fatigue.  HENT:  Negative for mouth sores and mouth dryness.   Eyes:  Negative for dryness.  Respiratory:  Negative for difficulty breathing.   Cardiovascular:  Negative for chest pain and palpitations.  Gastrointestinal:  Negative for blood in stool, constipation and diarrhea.  Endocrine: Negative for increased urination.  Genitourinary:  Negative for involuntary urination.  Musculoskeletal:  Positive for joint pain, gait problem and joint pain. Negative for joint swelling, myalgias, muscle weakness, morning stiffness, muscle tenderness and myalgias.  Skin:  Positive for hair loss. Negative for color change, rash and sensitivity to sunlight.  Allergic/Immunologic:  Negative for susceptible to infections.  Neurological:  Negative for dizziness and headaches.  Hematological:  Negative for swollen glands.  Psychiatric/Behavioral:  Positive for sleep disturbance. Negative for depressed mood. The patient is not nervous/anxious.     PMFS History:  Patient Active Problem List   Diagnosis Date Noted   Weight gain 12/29/2023   Vitamin B12 deficiency 06/14/2023   Asthma 06/14/2023   Subclinical hypothyroidism 06/13/2023   Primary osteoarthritis of left knee 04/10/2023   Keratosis 07/15/2022   Onychomycosis of nail of digit of hand 03/03/2022   Nocturia 11/24/2021   OAB (overactive bladder) 11/24/2021   Urge incontinence 11/24/2021   Urinary urgency 11/24/2021   Vaginal atrophy 11/24/2021   Vitamin D  deficiency 10/12/2021   Mixed hyperlipidemia 10/12/2021   Frequent UTI 10/12/2021   Aortic atherosclerosis (HCC) 10/09/2020   Fall 05/04/2020   Head pain 05/10/2019   GERD (gastroesophageal reflux disease) 10/17/2018   Herpes zoster without complication 03/01/2018   Right elbow pain 03/01/2018   Carpal tunnel syndrome on right 11/30/2017   Cough 08/22/2017   Primary osteoarthritis of both hands 03/08/2017   Poor balance 02/07/2017   Subarachnoid bleed (HCC) 01/11/2017   Psoriatic arthropathy (HCC) 09/08/2016   Hypertension 09/08/2016   Diabetic neuropathy (HCC) 09/08/2016   LADA (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), managed as type 1 (HCC) 05/04/2016   Syncope 03/25/2016   RLS (restless legs syndrome) 12/11/2015   Lumbago 05/06/2015   Peripheral edema 04/14/2015   Psoriasis 04/14/2015   Emphysema of lung (  HCC) 11/26/2014   Radiation-induced esophageal stricture 10/01/2014   DNR (do not resuscitate) discussion 08/21/2014   Fatigue 07/18/2014   Neuropathy due to chemotherapeutic drug (HCC) 07/18/2014   Muscle cramping 07/18/2014   SOB (shortness of breath) 04/28/2014   Radiation esophagitis 04/10/2014   Cancer of middle lobe of lung (HCC)  01/17/2014   Malignant neoplasm of right upper lobe of lung (HCC) 11/28/2013    Past Medical History:  Diagnosis Date   Cancer (HCC) dx'd 09/2013   Lung ca   Concussion 2018   Diabetes (HCC) 09/08/2016   type I diabetic patient reported on 09/13/16    Diabetes mellitus    insulin    Diabetic neuropathy (HCC) 09/08/2016   Fall 2018   GERD (gastroesophageal reflux disease)    no meds for   History of colitis    History of hiatal hernia    Hx of radiation therapy 12/16/13-01/30/14   lung 66Gy   Hypertension    Malignant neoplasm of right upper lobe of lung (HCC) 11/28/2013   Neuropathy    Psoriasis    Psoriatic arthritis (HCC) 09/08/2016   Rheumatoid arthritis (HCC)    Shortness of breath dyspnea    with exertion   UTI (urinary tract infection) 03/31/2024   on going for 2 months    Family History  Problem Relation Age of Onset   Other Mother    Anuerysm Mother        brain   Other Father        failure to thrive   Hypertension Other    Stroke Other    Heart attack Other    Hemachromatosis Other        great grandfather   Rheum arthritis Other        both sets of grandparents and father   Migraines Neg Hx    Colon cancer Neg Hx    Colon polyps Neg Hx    Esophageal cancer Neg Hx    Pancreatic cancer Neg Hx    Stomach cancer Neg Hx    Liver disease Neg Hx    Past Surgical History:  Procedure Laterality Date   ABDOMINAL HYSTERECTOMY     ABDOMINAL SURGERY     BALLOON DILATION N/A 12/12/2014   Procedure: BALLOON DILATION;  Surgeon: Lamar JONETTA Aho, MD;  Location: [facility];  Service: Endoscopy;  Laterality: N/A;   BIOPSY  01/20/2020   Procedure: BIOPSY;  Surgeon: Legrand Victory LITTIE DOUGLAS, MD;  Location: [facility];  Service: Gastroenterology;;   CHOLECYSTECTOMY     COLONOSCOPY N/A 02/07/2016   Procedure: COLONOSCOPY;  Surgeon: Victory LITTIE Legrand DOUGLAS, MD;  Location: [facility];  Service: Endoscopy;  Laterality: N/A;   DILATION AND CURETTAGE OF UTERUS      ESOPHAGOGASTRODUODENOSCOPY N/A 12/12/2014   Procedure: ESOPHAGOGASTRODUODENOSCOPY (EGD);  Surgeon: Lamar JONETTA Aho, MD;  Location: [facility];  Service: Endoscopy;  Laterality: N/A;  with dilation   ESOPHAGOGASTRODUODENOSCOPY (EGD) WITH PROPOFOL  N/A 10/09/2014   Procedure: ESOPHAGOGASTRODUODENOSCOPY (EGD) WITH PROPOFOL ;  Surgeon: Lamar JONETTA Aho, MD;  Location: [facility]COPY;  Service: Endoscopy;  Laterality: N/A;   ESOPHAGOGASTRODUODENOSCOPY (EGD) WITH PROPOFOL  N/A 09/19/2016   Procedure: ESOPHAGOGASTRODUODENOSCOPY (EGD) WITH PROPOFOL ;  Surgeon: Victory LITTIE Legrand DOUGLAS, MD;  Location: [facility];  Service: Gastroenterology;  Laterality: N/A;  with savary dil tt   ESOPHAGOGASTRODUODENOSCOPY (EGD) WITH PROPOFOL  N/A 01/20/2020   Procedure: ESOPHAGOGASTRODUODENOSCOPY (EGD) WITH PROPOFOL ;  Surgeon: Legrand Victory LITTIE DOUGLAS, MD;  Location: [facility];  Service: Gastroenterology;  Laterality: N/A;  Fluoro-Savary   ORIF ANKLE FRACTURE Right 07/04/2015   Procedure: OPEN REDUCTION INTERNAL FIXATION (ORIF) ANKLE FRACTURE;  Surgeon: Jerona Harden GAILS, MD;  Location: [facility];  Service: Orthopedics;  Laterality: Right;  OPEN REDUCTION, INTERNAL FIXATION OF RIGHT ANKLE FRACTURE.    ORIF ANKLE FRACTURE Left 02/10/2016   Procedure: OPEN REDUCTION INTERNAL FIXATION (ORIF) ANKLE FRACTURE;  Surgeon: Jerona Harden GAILS, MD;  Location: [facility];  Service: Orthopedics;  Laterality: Left;   SAVORY DILATION N/A 10/09/2014   Procedure: SAVORY DILATION;  Surgeon: Lamar JONETTA Aho, MD;  Location: [facility] ENDOSCOPY;  Service: Endoscopy;  Laterality: N/A;   SAVORY DILATION N/A 09/19/2016   Procedure: SAVORY DILATION;  Surgeon: Victory LITTIE Legrand DOUGLAS, MD;  Location: [facility];  Service: Gastroenterology;  Laterality: N/A;   SAVORY DILATION N/A 01/20/2020   Procedure: SAVORY DILATION;  Surgeon: Legrand Victory LITTIE DOUGLAS, MD;  Location: [facility];  Service: Gastroenterology;  Laterality: N/A;   TONSILLECTOMY     Social History   Social History Narrative   Patient  lives at home. She has an old friend who is her caregiver, Zachary Bevel, who stays with her 24/7.   Caffeine Use: 1 cups daily   Right handed   Immunization History  Administered Date(s) Administered   Fluad Quad(high Dose 65+) 07/31/2019, 10/01/2020, 07/15/2022   Influenza, High Dose Seasonal PF 08/12/2016, 08/30/2017, 09/07/2018, 08/24/2021   Influenza,inj,Quad PF,6+ Mos 11/07/2013, 07/14/2014, 08/25/2015   Influenza-Unspecified 08/07/2018, 09/07/2018   PFIZER(Purple Top)SARS-COV-2 Vaccination 11/28/2019, 12/25/2019, 06/18/2020   PPD Test 03/02/2016   Pfizer Covid-19 Vaccine Bivalent Booster 42yrs & up 07/27/2021   Pfizer(Comirnaty )Fall Seasonal Vaccine 12 years and older 08/17/2023   Pneumococcal Conjugate-13 05/10/2019   Pneumococcal Polysaccharide-23 03/19/2018   Pneumococcal-Unspecified 12/02/2015   Tdap 01/11/2017   Zoster Recombinant(Shingrix) 02/14/2018, 08/07/2018     Objective: Vital Signs: BP 116/73 (BP Location: Left Arm, Patient Position: Sitting, Cuff Size: Normal)   Pulse (!) 102   Resp 20   Ht 5' 5 (1.651 m)   Wt 181 lb 9.6 oz (82.4 kg)   BMI 30.22 kg/m    Physical Exam Vitals and nursing note reviewed.  Constitutional:      Appearance: She is well-developed.  HENT:     Head: Normocephalic and atraumatic.  Eyes:     Conjunctiva/sclera: Conjunctivae normal.  Cardiovascular:     Rate and Rhythm: Normal rate and regular rhythm.     Heart sounds: Normal heart sounds.  Pulmonary:     Effort: Pulmonary effort is normal.     Breath sounds: Normal breath sounds.  Abdominal:     General: Bowel sounds are normal.     Palpations: Abdomen is soft.  Musculoskeletal:     Cervical back: Normal range of motion.  Lymphadenopathy:     Cervical: No cervical adenopathy.  Skin:    General: Skin is warm and dry.     Capillary Refill: Capillary refill takes less than 2 seconds.  Neurological:     Mental Status: She is alert and oriented to person, place, and  time.  Psychiatric:        Behavior: Behavior normal.      Musculoskeletal Exam: She has some limitation with lateral rotation of the cervical spine.  Thoracic kyphosis was noted without any tenderness.  She had no tenderness over lumbar spine or SI joints.  Shoulders, elbows, wrists were in good range of motion without any synovitis.  She had no synovitis over MCPs.  She has subluxation of bilateral CMC and  first MCP joints.  PIP and DIP thickening with no synovitis was noted.  Hip joints were in good range of motion.  She good range of motion of bilateral knee joints without any warmth swelling or effusion.  There was no tenderness over her ankles or MTPs.  She had thickening of bilateral ankles.  CDAI Exam: CDAI Score: -- Patient Global: --; Provider Global: -- Swollen: --; Tender: -- Joint Exam 05/22/2024   No joint exam has been documented for this visit   There is currently no information documented on the homunculus. Go to the Rheumatology activity and complete the homunculus joint exam.  Investigation: No additional findings.  Imaging: No results found.  Recent Labs: Lab Results  Component Value Date   WBC 7.3 02/14/2024   HGB 13.4 02/14/2024   PLT 184 02/14/2024   NA 140 02/14/2024   K 3.4 (L) 02/14/2024   CL 103 02/14/2024   CO2 30 02/14/2024   GLUCOSE 271 (H) 02/14/2024   BUN 16 02/14/2024   CREATININE 0.91 02/14/2024   BILITOT 0.6 02/14/2024   ALKPHOS 110 02/14/2024   AST 12 (L) 02/14/2024   ALT 10 02/14/2024   PROT 6.6 02/14/2024   ALBUMIN 3.8 02/14/2024   CALCIUM  8.7 (L) 02/14/2024   GFRAA 56 (L) 02/03/2020   QFTBGOLDPLUS NEGATIVE 07/12/2023    Speciality Comments: Remicade-anaphylaxis Previous treatment Otezla, sulfasalazine , Humira Skyrizi  July 25, 2023  Procedures:  No procedures performed Allergies: Carboplatin , Remicade [infliximab], Other, Rifampin, and Hydromorphone    Assessment / Plan:     Visit Diagnoses: Psoriatic arthropathy (HCC)  - dxd Dr. Ethel 2004.  initially treated with IV Remicade, switched to Humira 2010-anaphylactic IV Remicade.Humira d/c 2015 dxd with lung cancer-CTX,RTX.  She has been on Skyrizi  since July 25, 2023.  She has been tolerating Skyrizi  well.  She had good response to Skyrizi  as regards to psoriatic arthritis and psoriasis.  No synovitis was noted on the examination.  Psoriasis -she had no active psoriasis lesions.  She states she gets occasional lesions which respond to topical agents.  High risk medication use - Skyrizi  150 mg sq injections every 12 weeks--Skyrizi  was initiated on 07/25/23. Previous therapy: Otezla,SSZ, remicade, and humira.  Labs from April 2025 reviewed.  Potassium was low at 3.4, calcium  was low at 8.7, GFR 56.  Will recheck labs today.  Plan: CBC with Differential/Platelet, Comprehensive metabolic panel with GFR.  TB Gold was negative on July 12, 2023.  She will get repeat TB Gold with her next labs.  Information reimmunization was placed in the AVS.  She was advised to hold Skyrizi  if she develops an infection and resume after the infection resolves.  Primary osteoarthritis of both hands-she had bilateral PIP and DIP thickening.  Joint protection muscle strengthening was discussed.  Primary osteoarthritis of both knees-she continues to have some discomfort in her knee joints.  No warmth swelling or effusion was noted.  Pes planus of both feet  Chronic midline low back pain without sciatica-she has been having some intermittent lower back discomfort.  Malignant neoplasm of right upper lobe of lung (HCC) - Dr. Gatha in 2015.  She was treated with chemotherapy and radiation therapy.  Patient states she did not finish chemotherapy.  Radiation-induced esophageal stricture  LADA (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), managed as type 1 (HCC)  History of esophagitis  Neuropathy due to chemotherapeutic drug (HCC)-patient is on Cymbalta .  History of hypertension-blood  pressure was normal today.  Aortic atherosclerosis (HCC)  History of subarachnoid hemorrhage  RLS (restless legs syndrome)  History of depression  Former smoker  Family history of psoriasis in maternal grandmother    Orders: Orders Placed This Encounter  Procedures   CBC with Differential/Platelet   Comprehensive metabolic panel with GFR   QuantiFERON-TB Gold Plus   No orders of the defined types were placed in this encounter.   Follow-Up Instructions: Return in about 5 months (around 10/22/2024) for Psoriatic arthritis, Osteoarthritis.   Maya Nash, MD  Note - This record has been created using Animal nutritionist.  Chart creation errors have been sought, but may not always  have been located. Such creation errors do not reflect on  the standard of medical care.

## 2024-05-13 NOTE — Progress Notes (Unsigned)
 Cardiology Office Note   Date:  05/14/2024  ID:  Megan [name] 1941/07/20, MRN [id number] PCP: Geofm Glade PARAS, MD  [facility] Providers Cardiologist:  Jerel Balding, MD {   History of Present Illness Megan [name] is a 83 y.o. female with a past medical history of hypertensive syncope, late onset type 1 diabetes mellitus, non-small cell lung cancer stage IIIb successfully treated with carboplatin /Paclitaxel ) residual neuropathy) with a collapsed lobe, GERD with esophageal stricture requiring esophageal plasty in 2021, HTN, OSA, psoriatic/rheumatoid arthritis, colitis, presenting for recent persistent tachycardia.  Rarely has palpitations but noticed that recently her heart was 100 to 120 bpm when she visited with another physician.  EKG at her last visit with us  11//24 shows sinus tachycardia 100 bpm.  History of orthostatic dizziness that leads to falls and bilateral leg fractures in the past.  Send switching from losartan  to amlodipine  she has not had any further falls although she does occasionally feel dizzy and foggy headed.  Currently taking 1.2 mg once a day.  Reports she had a recent 5 pound weight gain.  Denies any chest pain with rest or activity, recent syncope, claudication, focal neurologic events.  Takes Symbicort  intermittently.  Has been taking more regularly otherwise finds it hard to sleep at night due to her raspy breath.  Does not seem like she is describing true orthopnea or PND.  Has intermittent problems with lower extremity edema and has previously seen Dr. Eliza for peripheral venous insufficiency.  Doppler studies that showed evidence of venous insufficiency at the level of right common femoral and right greater saphenous vein and she has previously had superficial venous thrombosis.   She wore a cardiac monitor August 2017 that showed sinus rhythm/mild sinus tachycardia, no arrhythmia.  Echo 2017 was performed for tachycardia and did not  show significant structural abnormalities.  LV EF was 65% with no regional wall motion abnormalities.  Diastolic dysfunction could not be assessed.  Heart rate was 109 bpm.   Today, she  presents with  hypertension and elevated heart rate. She has experienced elevated heart rates with recent recordings of 101 bpm during this visit, 117 bpm at her last visit, 112 bpm a month ago, and 97 bpm in April. She is on amlodipine  5 mg daily for hypertension. Previous falls were attributed to losartan , which was changed to amlodipine . She has diabetes with an A1c of 9 and uses a continuous glucose monitor for insulin  dosing. She is on pravastatin  40 mg for cholesterol management, with recent labs showing good control. She lives in a retirement community and manages her health conditions independently. She has had several bouts of UTIs and has been on an antibiotic for 2 months (this is her 6th one). Elevated HR started around the same time as her infection.   Reports no shortness of breath nor dyspnea on exertion. Reports no chest pain, pressure, or tightness. No edema, orthopnea, PND. Reports no palpitations.    Discussed the use of AI scribe software for clinical note transcription with the patient, who gave verbal consent to proceed.   ROS: pertinent ROS in HPI  Studies Reviewed     Echo 10/04/23 IMPRESSIONS     1. Left ventricular ejection fraction, by estimation, is 60 to 65%. The  left ventricle has normal function. The left ventricle has no regional  wall motion abnormalities. There is moderate concentric left ventricular  hypertrophy. Left ventricular  diastolic parameters are indeterminate.   2. Right ventricular systolic function  is normal. The right ventricular  size is normal. Tricuspid regurgitation signal is inadequate for assessing  PA pressure.   3. The mitral valve is normal in structure. No evidence of mitral valve  regurgitation.   4. The aortic valve is tricuspid. Aortic valve  regurgitation is mild to  moderate.   5. The inferior vena cava is normal in size with greater than 50%  respiratory variability, suggesting right atrial pressure of 3 mmHg.   FINDINGS   Left Ventricle: Left ventricular ejection fraction, by estimation, is 60  to 65%. The left ventricle has normal function. The left ventricle has no  regional wall motion abnormalities. The left ventricular internal cavity  size was normal in size. There is   moderate concentric left ventricular hypertrophy. Left ventricular  diastolic parameters are indeterminate.   Right Ventricle: The right ventricular size is normal. Right ventricular  systolic function is normal. Tricuspid regurgitation signal is inadequate  for assessing PA pressure.   Left Atrium: Left atrial size was normal in size.   Right Atrium: Right atrial size was normal in size.   Pericardium: There is no evidence of pericardial effusion.   Mitral Valve: The mitral valve is normal in structure. No evidence of  mitral valve regurgitation.   Tricuspid Valve: Tricuspid valve regurgitation is not demonstrated.   Aortic Valve: The aortic valve is tricuspid. Aortic valve regurgitation is  mild to moderate. Aortic regurgitation PHT measures 304 msec.   Pulmonic Valve: Pulmonic valve regurgitation is not visualized.   Aorta: The aortic root and ascending aorta are structurally normal, with  no evidence of dilitation.   Venous: The inferior vena cava is normal in size with greater than 50%  respiratory variability, suggesting right atrial pressure of 3 mmHg.   IAS/Shunts: No atrial level shunt detected by color flow Doppler   Physical Exam VS:  BP 110/80   Pulse (!) 101   Ht 5' 5 (1.651 m)   Wt 180 lb (81.6 kg)   SpO2 96%   BMI 29.95 kg/m        Wt Readings from Last 3 Encounters:  05/14/24 180 lb (81.6 kg)  04/03/24 179 lb (81.2 kg)  02/21/24 182 lb 9.6 oz (82.8 kg)    GEN: Well nourished, well developed in no acute  distress NECK: No JVD; No carotid bruits CARDIAC: RRR, no murmurs, rubs, gallops RESPIRATORY:  ++ wheezing in all fields, no rhonchi  ABDOMEN: Soft, non-tender, non-distended EXTREMITIES:  No edema; No deformity   ASSESSMENT AND PLAN Elevated heart rate Heart rate elevated to 101 bpm, likely related to recent UTI. - Monitor heart rate after UTI resolution. - Consider beta blocker if heart rate remains elevated post-UTI.  Urinary tract infection Recurrent UTI with resistant bacteria, symptoms resolved. Infection may have contributed to elevated heart rate. - Consult primary care physician for referral to specialist for recurrent UTIs. - Obtain clean urine analysis to confirm infection resolution.  Type I Diabetes mellitus with hyperglycemia, insulin  dependent Poorly controlled diabetes with A1c of 9. High sugar intake admitted. - Continue current insulin  regimen with receiver monitoring. - Encourage dietary modifications to reduce sugar intake.  Wheezing Wheezing noted. Not using prescribed Symbicort  due to steroid side effects concerns. Discussed benefits of Symbicort  and option of albuterol  for short-term relief. - Encourage use of Symbicort  to improve breathing. - Encouraged to use prescribed albuterol  as needed  Non-small cell lung cancer Stage 3B with one collapsed lobe.  Fractures due to fall Fractures in ankle  and leg due to falls, previously attributed to losartan  causing hypotension. - Continue amlodipine  for blood pressure management.   HLP -continue current medication regimen -LDL 79      Dispo: She can follow-up in 6 months with Dr. Francyne   Signed, Orren LOISE Fabry, PA-C

## 2024-05-14 ENCOUNTER — Encounter: Payer: Self-pay | Admitting: Physician Assistant

## 2024-05-14 ENCOUNTER — Ambulatory Visit: Attending: Physician Assistant | Admitting: Physician Assistant

## 2024-05-14 VITALS — BP 110/80 | HR 101 | Ht 65.0 in | Wt 180.0 lb

## 2024-05-14 DIAGNOSIS — R000 Tachycardia, unspecified: Secondary | ICD-10-CM | POA: Diagnosis not present

## 2024-05-14 DIAGNOSIS — I10 Essential (primary) hypertension: Secondary | ICD-10-CM | POA: Diagnosis not present

## 2024-05-14 DIAGNOSIS — E782 Mixed hyperlipidemia: Secondary | ICD-10-CM | POA: Diagnosis not present

## 2024-05-14 DIAGNOSIS — R0602 Shortness of breath: Secondary | ICD-10-CM | POA: Diagnosis not present

## 2024-05-14 MED ORDER — AMLODIPINE BESYLATE 5 MG PO TABS
ORAL_TABLET | ORAL | 2 refills | Status: AC
Start: 2024-05-14 — End: ?

## 2024-05-14 MED ORDER — PRAVASTATIN SODIUM 40 MG PO TABS: ORAL_TABLET | ORAL | 2 refills | Status: AC

## 2024-05-14 NOTE — Patient Instructions (Addendum)
 Medication Instructions:   Your physician recommends that you continue on your current medications as directed. Please refer to the Current Medication list given to you today.  *If you need a refill on your cardiac medications before your next appointment, please call your pharmacy*    Follow-Up: At [facility], you and your health needs are our priority.  As part of our continuing mission to provide you with exceptional heart care, our providers are all part of one team.  This team includes your primary Cardiologist (physician) and Advanced Practice Providers or APPs (Physician Assistants and Nurse Practitioners) who all work together to provide you with the care you need, when you need it.  Your next appointment:    6 month(s)  Provider:   Jerel Balding, MD     Please continue monitoring/tracking your blood pressures and heart rates at home  Blood Pressure Record Sheet To take your blood pressure, you will need a blood pressure machine. You can buy a blood pressure machine (blood pressure monitor) at your clinic, drug store, or online. When choosing one, consider: An automatic monitor that has an arm cuff. A cuff that wraps snugly around your upper arm. You should be able to fit only one finger between your arm and the cuff. A device that stores blood pressure reading results. Do not choose a monitor that measures your blood pressure from your wrist or finger. Follow your health care provider's instructions for how to take your blood pressure. To use this form: Take your blood pressure medications every day These measurements should be taken when you have been at rest for at least 10-15 min Take at least 2 readings with each blood pressure check. This makes sure the results are correct. Wait 1-2 minutes between measurements. Write down the results in the spaces on this form. Keep in mind it should always be recorded systolic over diastolic. Both numbers are important.   Repeat this every day for 2-3 weeks, or as told by your health care provider.  Make a follow-up appointment with your health care provider to discuss the results.  Blood Pressure Log Date Medications taken? (Y/N) Blood Pressure Time of Day

## 2024-05-16 ENCOUNTER — Telehealth: Payer: Self-pay

## 2024-05-16 DIAGNOSIS — N390 Urinary tract infection, site not specified: Secondary | ICD-10-CM

## 2024-05-16 DIAGNOSIS — R399 Unspecified symptoms and signs involving the genitourinary system: Secondary | ICD-10-CM | POA: Diagnosis not present

## 2024-05-16 NOTE — Telephone Encounter (Signed)
 Copied from CRM [id number]. Topic: Referral - Question >> May 16, 2024  2:46 PM Mia F wrote: Reason for CRM: Pt is asking to be referred to a specialist in urology  because she has had 8 UTI's in the last two months. She is very concerned and would like to get to find the cause and a resolution.  Eu#[id number]

## 2024-05-17 NOTE — Telephone Encounter (Signed)
 Message left for patient that Dr. Geofm is currently out of the office.

## 2024-05-18 NOTE — Addendum Note (Signed)
 Addended by: GEOFM GLADE PARAS on: 05/18/2024 03:17 PM   Modules accepted: Orders

## 2024-05-18 NOTE — Telephone Encounter (Signed)
 Referral ordered

## 2024-05-21 ENCOUNTER — Telehealth: Payer: Self-pay | Admitting: *Deleted

## 2024-05-21 ENCOUNTER — Other Ambulatory Visit: Payer: Self-pay

## 2024-05-21 ENCOUNTER — Other Ambulatory Visit: Payer: Self-pay | Admitting: Rheumatology

## 2024-05-21 DIAGNOSIS — L4050 Arthropathic psoriasis, unspecified: Secondary | ICD-10-CM

## 2024-05-21 DIAGNOSIS — L409 Psoriasis, unspecified: Secondary | ICD-10-CM

## 2024-05-21 DIAGNOSIS — Z79899 Other long term (current) drug therapy: Secondary | ICD-10-CM

## 2024-05-21 MED ORDER — SKYRIZI PEN 150 MG/ML ~~LOC~~ SOAJ
SUBCUTANEOUS | 0 refills | Status: DC
  Filled 2024-05-22: qty 1, 84d supply, fill #0

## 2024-05-21 NOTE — Telephone Encounter (Signed)
 Last Fill: 03/05/2024  Labs: 02/14/2024 Abs Immature Granulocytes 0.11, Potassium 3.4, Glucose 271, Calcium  8.7,AST 12  TB Gold: 07/12/2023 Neg    Next Visit: 05/22/2024  Last Visit: 12/13/2023  IK:Ednmpjupr arthropathy   Current Dose per office note 12/13/2023: Skyrizi  150 mg sq injections every 12 weeks   Patient to update labs at upcoming appointment tomorrow.   Okay to refill Skyrizi ?

## 2024-05-21 NOTE — Telephone Encounter (Signed)
 Called pt & left message informing that she should reschedule visit with Dr Sherrod but could be phone/virtual visit if she prefers.  Suggested she call scheduling to get r/s.

## 2024-05-21 NOTE — Progress Notes (Signed)
 Specialty Pharmacy Refill Coordination Note  ECHO PROPP is a 83 y.o. female contacted today regarding refills of specialty medication(s) Risankizumab -rzaa (Skyrizi  Pen)   Patient requested Delivery   Delivery date: 05/28/24   Verified address: [address]   [place] [address]   Medication will be filled on 05/27/24.   This fill date is pending response to refill request from provider. Patient is aware and if they have not received fill by intended date they must follow up with pharmacy.

## 2024-05-22 ENCOUNTER — Encounter: Payer: Self-pay | Admitting: Rheumatology

## 2024-05-22 ENCOUNTER — Ambulatory Visit: Payer: Medicare PPO | Attending: Rheumatology | Admitting: Rheumatology

## 2024-05-22 ENCOUNTER — Other Ambulatory Visit: Payer: Self-pay

## 2024-05-22 VITALS — BP 116/73 | HR 102 | Resp 20 | Ht 65.0 in | Wt 181.6 lb

## 2024-05-22 DIAGNOSIS — I700 Atherosclerosis of aorta: Secondary | ICD-10-CM

## 2024-05-22 DIAGNOSIS — Z8659 Personal history of other mental and behavioral disorders: Secondary | ICD-10-CM

## 2024-05-22 DIAGNOSIS — K222 Esophageal obstruction: Secondary | ICD-10-CM

## 2024-05-22 DIAGNOSIS — M170 Bilateral primary osteoarthritis of knee: Secondary | ICD-10-CM

## 2024-05-22 DIAGNOSIS — C3411 Malignant neoplasm of upper lobe, right bronchus or lung: Secondary | ICD-10-CM

## 2024-05-22 DIAGNOSIS — Z8679 Personal history of other diseases of the circulatory system: Secondary | ICD-10-CM

## 2024-05-22 DIAGNOSIS — L409 Psoriasis, unspecified: Secondary | ICD-10-CM

## 2024-05-22 DIAGNOSIS — T451X5A Adverse effect of antineoplastic and immunosuppressive drugs, initial encounter: Secondary | ICD-10-CM

## 2024-05-22 DIAGNOSIS — L4050 Arthropathic psoriasis, unspecified: Secondary | ICD-10-CM

## 2024-05-22 DIAGNOSIS — Z79899 Other long term (current) drug therapy: Secondary | ICD-10-CM | POA: Diagnosis not present

## 2024-05-22 DIAGNOSIS — M2142 Flat foot [pes planus] (acquired), left foot: Secondary | ICD-10-CM

## 2024-05-22 DIAGNOSIS — Z8719 Personal history of other diseases of the digestive system: Secondary | ICD-10-CM

## 2024-05-22 DIAGNOSIS — E139 Other specified diabetes mellitus without complications: Secondary | ICD-10-CM

## 2024-05-22 DIAGNOSIS — M545 Low back pain, unspecified: Secondary | ICD-10-CM | POA: Diagnosis not present

## 2024-05-22 DIAGNOSIS — G620 Drug-induced polyneuropathy: Secondary | ICD-10-CM

## 2024-05-22 DIAGNOSIS — M19042 Primary osteoarthritis, left hand: Secondary | ICD-10-CM

## 2024-05-22 DIAGNOSIS — M19041 Primary osteoarthritis, right hand: Secondary | ICD-10-CM | POA: Diagnosis not present

## 2024-05-22 DIAGNOSIS — M2141 Flat foot [pes planus] (acquired), right foot: Secondary | ICD-10-CM | POA: Diagnosis not present

## 2024-05-22 DIAGNOSIS — Z840 Family history of diseases of the skin and subcutaneous tissue: Secondary | ICD-10-CM

## 2024-05-22 DIAGNOSIS — G8929 Other chronic pain: Secondary | ICD-10-CM

## 2024-05-22 DIAGNOSIS — G2581 Restless legs syndrome: Secondary | ICD-10-CM

## 2024-05-22 DIAGNOSIS — Z87891 Personal history of nicotine dependence: Secondary | ICD-10-CM

## 2024-05-22 LAB — COMPREHENSIVE METABOLIC PANEL WITH GFR
AG Ratio: 1.8 (calc) (ref 1.0–2.5)
ALT: 13 U/L (ref 6–29)
AST: 15 U/L (ref 10–35)
Albumin: 3.9 g/dL (ref 3.6–5.1)
Alkaline phosphatase (APISO): 98 U/L (ref 37–153)
BUN: 18 mg/dL (ref 7–25)
CO2: 31 mmol/L (ref 20–32)
Calcium: 8.8 mg/dL (ref 8.6–10.4)
Chloride: 102 mmol/L (ref 98–110)
Creat: 0.84 mg/dL (ref 0.60–0.95)
Globulin: 2.2 g/dL (ref 1.9–3.7)
Glucose, Bld: 137 mg/dL — ABNORMAL HIGH (ref 65–99)
Potassium: 3.8 mmol/L (ref 3.5–5.3)
Sodium: 142 mmol/L (ref 135–146)
Total Bilirubin: 0.5 mg/dL (ref 0.2–1.2)
Total Protein: 6.1 g/dL (ref 6.1–8.1)
eGFR: 69 mL/min/1.73m2 (ref >=60)

## 2024-05-22 LAB — CBC WITH DIFFERENTIAL/PLATELET
Absolute Lymphocytes: 1883 {cells}/uL (ref 850–3900)
Absolute Monocytes: 608 {cells}/uL (ref 200–950)
Basophils Absolute: 90 {cells}/uL (ref 0–200)
Basophils Relative: 1.2 %
Eosinophils Absolute: 428 {cells}/uL (ref 15–500)
Eosinophils Relative: 5.7 %
HCT: 41.7 % (ref 35.0–45.0)
Hemoglobin: 13.6 g/dL (ref 11.7–15.5)
MCH: 29.4 pg (ref 27.0–33.0)
MCHC: 32.6 g/dL (ref 32.0–36.0)
MCV: 90.1 fL (ref 80.0–100.0)
MPV: 10 fL (ref 7.5–12.5)
Monocytes Relative: 8.1 %
Neutro Abs: 4493 {cells}/uL (ref 1500–7800)
Neutrophils Relative %: 59.9 %
Platelets: 202 Thousand/uL (ref 140–400)
RBC: 4.63 Million/uL (ref 3.80–5.10)
RDW: 14.8 % (ref 11.0–15.0)
Total Lymphocyte: 25.1 %
WBC: 7.5 Thousand/uL (ref 3.8–10.8)

## 2024-05-22 NOTE — Patient Instructions (Signed)
 Standing Labs We placed an order today for your standing lab work.   Please have your standing labs drawn in  October and every 3 months  Please have your labs drawn 2 weeks prior to your appointment so that the provider can discuss your lab results at your appointment, if possible.  Please note that you may see your imaging and lab results in MyChart before we have reviewed them. We will contact you once all results are reviewed. Please allow our office up to 72 hours to thoroughly review all of the results before contacting the office for clarification of your results.  WALK-IN LAB HOURS  Monday through Thursday from 8:00 am -12:30 pm and 1:00 pm-4:30 pm and Friday from 8:00 am-12:00 pm.  Patients with office visits requiring labs will be seen before walk-in labs.  You may encounter longer than normal wait times. Please allow additional time. Wait times may be shorter on  Monday and Thursday afternoons.  We do not book appointments for walk-in labs. We appreciate your patience and understanding with our staff.   Labs are drawn by [facility]. Please bring your co-pay at the time of your lab draw.  You may receive a bill from [facility] for your lab work.  Please note if you are on Hydroxychloroquine and and an order has been placed for a Hydroxychloroquine level,  you will need to have it drawn 4 hours or more after your last dose.  If you wish to have your labs drawn at another location, please call the office 24 hours in advance so we can fax the orders.  The office is located at [address], [place], [place] [address]   If you have any questions regarding directions or hours of operation,  please call [phone].   As a reminder, please drink plenty of water  prior to coming for your lab work. Thanks!  Vaccines You are taking a medication(s) that can suppress your immune system.  The following immunizations are recommended: Flu annually Covid-19  Td/Tdap (tetanus,  diphtheria, pertussis) every 10 years Pneumonia (Prevnar 15 then Pneumovax 23 at least 1 year apart.  Alternatively, can take Prevnar 20 without needing additional dose) Shingrix: 2 doses from 4 weeks to 6 months apart  Please check with your PCP to make sure you are up to date.   If you have signs or symptoms of an infection or start antibiotics: First, call your PCP for workup of your infection. Hold your medication through the infection, until you complete your antibiotics, and until symptoms resolve if you take the following: Injectable medication (Actemra, Benlysta, Cimzia, Cosentyx, Enbrel, Humira, Kevzara, Orencia , Remicade, Simponi, Stelara, Taltz, Tremfya) Methotrexate  Leflunomide  (Arava ) Mycophenolate (Cellcept) Xeljanz, Olumiant, or Rinvoq

## 2024-05-23 ENCOUNTER — Ambulatory Visit: Payer: Self-pay | Admitting: Rheumatology

## 2024-05-23 NOTE — Progress Notes (Signed)
CBC and CMP normal except glucose is mildly elevated.

## 2024-05-27 ENCOUNTER — Other Ambulatory Visit: Payer: Self-pay

## 2024-05-27 DIAGNOSIS — R399 Unspecified symptoms and signs involving the genitourinary system: Secondary | ICD-10-CM | POA: Diagnosis not present

## 2024-05-30 ENCOUNTER — Ambulatory Visit: Admitting: Family

## 2024-05-30 ENCOUNTER — Ambulatory Visit: Payer: Medicare PPO

## 2024-05-30 VITALS — BP 110/76 | HR 105 | Temp 98.3°F | Ht 65.0 in | Wt 179.4 lb

## 2024-05-30 VITALS — Ht 65.0 in | Wt 179.0 lb

## 2024-05-30 DIAGNOSIS — R350 Frequency of micturition: Secondary | ICD-10-CM | POA: Diagnosis not present

## 2024-05-30 DIAGNOSIS — Z0000 Encounter for general adult medical examination without abnormal findings: Secondary | ICD-10-CM

## 2024-05-30 DIAGNOSIS — N390 Urinary tract infection, site not specified: Secondary | ICD-10-CM | POA: Diagnosis not present

## 2024-05-30 DIAGNOSIS — A499 Bacterial infection, unspecified: Secondary | ICD-10-CM

## 2024-05-30 LAB — POC URINALSYSI DIPSTICK (AUTOMATED)
Bilirubin, UA: NEGATIVE
Blood, UA: NEGATIVE
Glucose, UA: NEGATIVE
Ketones, UA: NEGATIVE
Nitrite, UA: POSITIVE
Protein, UA: NEGATIVE
Spec Grav, UA: <=1.005 — AB (ref 1.010–1.025)
Urobilinogen, UA: 0.2 U/dL
pH, UA: 7 (ref 5.0–8.0)

## 2024-05-30 NOTE — Patient Instructions (Addendum)
 Ms. Skarda , Thank you for taking time out of your busy schedule to complete your Annual Wellness Visit with me. I enjoyed our conversation and look forward to speaking with you again next year. I, as well as your care team,  appreciate your ongoing commitment to your health goals. Please review the following plan we discussed and let me know if I can assist you in the future. Your Game plan/ To Do List    Referrals: If you haven't heard from the office you've been referred to, please reach out to them at the phone provided.   Follow up Visits: We will see or speak with you next year for your Next Medicare AWV with our clinical staff Have you seen your provider in the last 6 months (3 months if uncontrolled diabetes)? Yes  Clinician Recommendations:  Aim for 30 minutes of exercise or brisk walking, 6-8 glasses of water , and 5 servings of fruits and vegetables each day.       This is a list of the screenings recommended for you:  Health Maintenance  Topic Date Due   Yearly kidney health urinalysis for diabetes  Never done   Complete foot exam   01/25/2023   Eye exam for diabetics  10/20/2023   COVID-19 Vaccine (6 - Pfizer risk 2024-25 season) 02/15/2024   Flu Shot  05/31/2024   Hemoglobin A1C  06/26/2024   Yearly kidney function blood test for diabetes  05/22/2025   Medicare Annual Wellness Visit  05/30/2025   DTaP/Tdap/Td vaccine (2 - Td or Tdap) 01/12/2027   Pneumococcal Vaccine for age over 23  Completed   Zoster (Shingles) Vaccine  Completed   Hepatitis B Vaccine  Aged Out   HPV Vaccine  Aged Out   Meningitis B Vaccine  Aged Out   DEXA scan (bone density measurement)  Discontinued   Hepatitis C Screening  Discontinued    Advanced directives: (In Chart) A copy of your advanced directives are scanned into your chart should your provider ever need it. Advance Care Planning is important because it:  [x]  Makes sure you receive the medical care that is consistent with your values,  goals, and preferences  [x]  It provides guidance to your family and loved ones and reduces their decisional burden about whether or not they are making the right decisions based on your wishes.  Follow the link provided in your after visit summary or read over the paperwork we have mailed to you to help you started getting your Advance Directives in place. If you need assistance in completing these, please reach out to us  so that we can help you!

## 2024-05-30 NOTE — Progress Notes (Unsigned)
 Acute Office Visit  Subjective:     Patient ID: Megan [name], female    DOB: [birth year], 83 y.o.   MRN: [id number]  Chief Complaint  Patient presents with   Urinary Tract Infection    Burning sensation and urgency pt stated it been an on off issue since march of this year     HPI Patient is an 83 year old patient of Dr. Geofm presenting today with concerns of inability to see her urology specialist.  She dropped a urine off with [facility] and was prescribed Macrobid.  She has not started the medication.  She is concerned that since March she has had a urinary tract infection monthly.  Today she reports with concerns of pressure.  Denies any frequency, urgency or burning.  She has not called to request an appointment with [facility].    In the past she has also seen [facility] but felt it was too crowded and does not want to return.  Denies any fever or chills.  Review of Systems  Constitutional: Negative.   Respiratory: Negative.    Cardiovascular: Negative.   Genitourinary:  Negative for frequency.       Pelvic pressure  Musculoskeletal: Negative.   Neurological: Negative.   Psychiatric/Behavioral: Negative.    All other systems reviewed and are negative.  Past Medical History:  Diagnosis Date   Cancer (HCC) dx'd 09/2013   Lung ca   Concussion 2018   Diabetes (HCC) 09/08/2016   type I diabetic patient reported on 09/13/16    Diabetes mellitus    insulin    Diabetic neuropathy (HCC) 09/08/2016   Fall 2018   GERD (gastroesophageal reflux disease)    no meds for   History of colitis    History of hiatal hernia    Hx of radiation therapy 12/16/13-01/30/14   lung 66Gy   Hypertension    Malignant neoplasm of right upper lobe of lung (HCC) 11/28/2013   Neuropathy    Psoriasis    Psoriatic arthritis (HCC) 09/08/2016   Rheumatoid arthritis (HCC)    Shortness of breath dyspnea    with exertion   UTI (urinary tract infection) 03/31/2024   on going for 2 months     Social History   Socioeconomic History   Marital status: Divorced    Spouse name: Not on file   Number of children: 0   Years of education: MA   Highest education level: Master's degree (e.g., MA, MS, MEng, MEd, MSW, MBA)  Occupational History   Occupation: Retired    Associate Professor: OTHER  Tobacco Use   Smoking status: Former    Current packs/day: 0.00    Average packs/day: 3.0 packs/day for 45.0 years (135.0 ttl pk-yrs)    Types: Cigarettes    Start date: 12/05/1952    Quit date: 12/05/1997    Years since quitting: 26.5    Passive exposure: Never   Smokeless tobacco: Never  Vaping Use   Vaping status: Never Used  Substance and Sexual Activity   Alcohol use: Yes    Comment: rarely    Drug use: No   Sexual activity: Not Currently  Other Topics Concern   Not on file  Social History Narrative   Patient lives at home. She has an old friend who is her caregiver, Zachary Bevel, who stays with her 24/7.   Caffeine Use: 1 cups daily   Right handed   Social Drivers of Health   Financial Resource Strain: Low Risk  (05/30/2023)   Overall  Financial Resource Strain (CARDIA)    Difficulty of Paying Living Expenses: Not hard at all  Food Insecurity: No Food Insecurity (05/30/2023)   Hunger Vital Sign    Worried About Running Out of Food in the Last Year: Never true    Ran Out of Food in the Last Year: Never true  Transportation Needs: No Transportation Needs (05/30/2023)   PRAPARE - Administrator, Civil Service (Medical): No    Lack of Transportation (Non-Medical): No  Physical Activity: Inactive (05/30/2023)   Exercise Vital Sign    Days of Exercise per Week: 0 days    Minutes of Exercise per Session: 0 min  Stress: No Stress Concern Present (05/30/2023)   [employer] of Occupational Health - Occupational Stress Questionnaire    Feeling of Stress : Not at all  Social Connections: Moderately Integrated (05/30/2023)   Social Connection and Isolation Panel     Frequency of Communication with Friends and Family: Once a week    Frequency of Social Gatherings with Friends and Family: More than three times a week    Attends Religious Services: 1 to 4 times per year    Active Member of [employer] or Organizations: Yes    Attends Banker Meetings: 1 to 4 times per year    Marital Status: Divorced  Intimate Partner Violence: Not At Risk (05/30/2023)   Humiliation, Afraid, Rape, and Kick questionnaire    Fear of Current or Ex-Partner: No    Emotionally Abused: No    Physically Abused: No    Sexually Abused: No    Past Surgical History:  Procedure Laterality Date   ABDOMINAL HYSTERECTOMY     ABDOMINAL SURGERY     BALLOON DILATION N/A 12/12/2014   Procedure: BALLOON DILATION;  Surgeon: Lamar JONETTA Aho, MD;  Location: [facility];  Service: Endoscopy;  Laterality: N/A;   BIOPSY  01/20/2020   Procedure: BIOPSY;  Surgeon: Legrand Victory LITTIE DOUGLAS, MD;  Location: [facility];  Service: Gastroenterology;;   CHOLECYSTECTOMY     COLONOSCOPY N/A 02/07/2016   Procedure: COLONOSCOPY;  Surgeon: Victory LITTIE Legrand DOUGLAS, MD;  Location: [facility];  Service: Endoscopy;  Laterality: N/A;   DILATION AND CURETTAGE OF UTERUS     ESOPHAGOGASTRODUODENOSCOPY N/A 12/12/2014   Procedure: ESOPHAGOGASTRODUODENOSCOPY (EGD);  Surgeon: Lamar JONETTA Aho, MD;  Location: [facility];  Service: Endoscopy;  Laterality: N/A;  with dilation   ESOPHAGOGASTRODUODENOSCOPY (EGD) WITH PROPOFOL  N/A 10/09/2014   Procedure: ESOPHAGOGASTRODUODENOSCOPY (EGD) WITH PROPOFOL ;  Surgeon: Lamar JONETTA Aho, MD;  Location: [facility]COPY;  Service: Endoscopy;  Laterality: N/A;   ESOPHAGOGASTRODUODENOSCOPY (EGD) WITH PROPOFOL  N/A 09/19/2016   Procedure: ESOPHAGOGASTRODUODENOSCOPY (EGD) WITH PROPOFOL ;  Surgeon: Victory LITTIE Legrand DOUGLAS, MD;  Location: [facility];  Service: Gastroenterology;  Laterality: N/A;  with savary dil tt   ESOPHAGOGASTRODUODENOSCOPY (EGD) WITH PROPOFOL  N/A 01/20/2020   Procedure:  ESOPHAGOGASTRODUODENOSCOPY (EGD) WITH PROPOFOL ;  Surgeon: Legrand Victory LITTIE DOUGLAS, MD;  Location: [facility];  Service: Gastroenterology;  Laterality: N/A;  Fluoro-Savary   ORIF ANKLE FRACTURE Right 07/04/2015   Procedure: OPEN REDUCTION INTERNAL FIXATION (ORIF) ANKLE FRACTURE;  Surgeon: Jerona Harden GAILS, MD;  Location: [facility];  Service: Orthopedics;  Laterality: Right;  OPEN REDUCTION, INTERNAL FIXATION OF RIGHT ANKLE FRACTURE.    ORIF ANKLE FRACTURE Left 02/10/2016   Procedure: OPEN REDUCTION INTERNAL FIXATION (ORIF) ANKLE FRACTURE;  Surgeon: Jerona Harden GAILS, MD;  Location: [facility];  Service: Orthopedics;  Laterality: Left;   SAVORY DILATION N/A 10/09/2014   Procedure:  SAVORY DILATION;  Surgeon: Lamar JONETTA Aho, MD;  Location[facility] END[facility]COPY;  Service: Endoscopy;  Laterality: N/A;   SAVORY DILATION N/A 09/19/2016   Procedure: SAVORY DILATION;  Surgeon: Victory LITTIE Legrand DOUGLAS, MD;  Location: [facility];  Service: Gastroenterology;  Laterality: N/A;   SAVORY DILATION N/A 01/20/2020   Procedure: SAVORY DILATION;  Surgeon: Legrand Victory LITTIE DOUGLAS, MD;  Location: [facility];  Service: Gastroenterology;  Laterality: N/A;   TONSILLECTOMY      Family History  Problem Relation Age of Onset   Other Mother    Anuerysm Mother        brain   Other Father        failure to thrive   Hypertension Other    Stroke Other    Heart attack Other    Hemachromatosis Other        great grandfather   Rheum arthritis Other        both sets of grandparents and father   Migraines Neg Hx    Colon cancer Neg Hx    Colon polyps Neg Hx    Esophageal cancer Neg Hx    Pancreatic cancer Neg Hx    Stomach cancer Neg Hx    Liver disease Neg Hx     Allergies  Allergen Reactions   Carboplatin  Anaphylaxis, Shortness Of Breath and Other (See Comments)    Neuropathy   Remicade [Infliximab] Anaphylaxis   Other    Rifampin Other (See Comments)    unknown   Hydromorphone  Rash    Current Outpatient Medications on File Prior to Visit   Medication Sig Dispense Refill   ACCU-CHEK GUIDE TEST test strip      albuterol  (VENTOLIN  HFA) 108 (90 Base) MCG/ACT inhaler Inhale 2 puffs into the lungs every 6 (six) hours as needed for wheezing or shortness of breath. 1 each 2   amLODipine  (NORVASC ) 5 MG tablet TAKE 1 TABLET BY MOUTH EVERY DAY 90 tablet 2   B-D UF III MINI PEN NEEDLES 31G X 5 MM MISC USE AS DIRECTED WITH INSULIN  4 TIMES A DAY 100 each 3   budesonide -formoterol  (SYMBICORT ) 80-4.5 MCG/ACT inhaler Inhale 2 puffs into the lungs as needed. 6.9 g 5   Cholecalciferol (VITAMIN D -3 PO) Take by mouth.     Continuous Blood Gluc Sensor (DEXCOM G6 SENSOR) MISC Apply new sensor every 10 days for continuous glucose monitoring. 9 each 3   Continuous Blood Gluc Transmit (DEXCOM G6 TRANSMITTER) MISC Use one transmitter every 3 months for continuous glucose monitoring. 1 each 0   Cyanocobalamin  (VITAMIN B12) 1000 MCG TABS Take by mouth.     DULoxetine  (CYMBALTA ) 60 MG capsule Take 1 capsule (60 mg total) by mouth 2 (two) times daily. (Patient taking differently: Take 60 mg by mouth as needed.) 180 capsule 1   fluocinolone (SYNALAR) 0.01 % external solution 1 Application.     GVOKE HYPOPEN  1-PACK 1 MG/0.2ML SOAJ Inject under skin 0.2 ml as needed for hypoglycemia 0.2 mL prn   insulin  aspart (NOVOLOG  FLEXPEN) 100 UNIT/ML FlexPen INJECT 18-22 UNITS UNDER THE SKIN THREE TIMES DAILY 45 mL 3   insulin  degludec (TRESIBA  FLEXTOUCH) 100 UNIT/ML FlexTouch Pen Inject up to 36-38 units into the skin daily 30 mL 3   ketoconazole  (NIZORAL ) 2 % shampoo AS DIRECTED EXTERNALLY 2-3 TIME A WEEK 30 DAYS; Duration: 30     pravastatin  (PRAVACHOL ) 40 MG tablet TAKE 1 TABLET(40 MG) BY MOUTH EVERY EVENING 90 tablet 2   risankizumab -rzaa (SKYRIZI  PEN) 150  MG/ML pen Inject 150mg  into the skin every 12 weeks 1 mL 0   No current facility-administered medications on file prior to visit.    BP 110/76   Pulse (!) 105   Temp 98.3 F (36.8 C) (Temporal)   Ht 5' 5  (1.651 m)   Wt 179 lb 6 oz (81.4 kg)   SpO2 96%   BMI 29.85 kg/m chart       Objective:    BP 110/76   Pulse (!) 105   Temp 98.3 F (36.8 C) (Temporal)   Ht 5' 5 (1.651 m)   Wt 179 lb 6 oz (81.4 kg)   SpO2 96%   BMI 29.85 kg/m  {Vitals History (Optional):23777}  Physical Exam Constitutional:      Appearance: Normal appearance. She is normal weight.  Cardiovascular:     Rate and Rhythm: Normal rate and regular rhythm.  Pulmonary:     Effort: Pulmonary effort is normal.     Breath sounds: Normal breath sounds.  Neurological:     Mental Status: She is alert.  Psychiatric:        Mood and Affect: Mood normal.        Behavior: Behavior normal.        Thought Content: Thought content normal.     No results found for any visits on 05/30/24.      Assessment & Plan:   Problem List Items Addressed This Visit   None Visit Diagnoses       Frequency of urination    -  Primary   Relevant Orders   POCT Urinalysis Dipstick (Automated)   Urine Culture     Bacterial urinary tract infection          I spoke with [facility] who will call patient to make an appointment.  I have encouraged her to start the Macrobid twice a day x 10 days that was prescribed by nurse practitioner did well there.  Call the office with any questions or concerns.  Recheck as scheduled or sooner as needed.   No follow-ups on file.  Syrai Gladwin B Samirah Scarpati, FNP

## 2024-05-30 NOTE — Progress Notes (Signed)
 Subjective:  Please attest and cosign this visit due to patients primary care provider not being in the office at the time the visit was completed.  (Pt of Dr Glade Hope)   Megan [name] is a 83 y.o. who presents for a Medicare Wellness preventive visit.  As a reminder, Annual Wellness Visits don't include a physical exam, and some assessments may be limited, especially if this visit is performed virtually. We may recommend an in-person follow-up visit with your provider if needed.  Visit Complete: Virtual I connected with  Megan [name] on 05/30/24 by a audio enabled telemedicine application and verified that I am speaking with the correct person using two identifiers.  Patient Location: Home  Provider Location: Office/Clinic  I discussed the limitations of evaluation and management by telemedicine. The patient expressed understanding and agreed to proceed.  Vital Signs: Because this visit was a virtual/telehealth visit, some criteria may be missing or patient reported. Any vitals not documented were not able to be obtained and vitals that have been documented are patient reported.  VideoDeclined- This patient declined Librarian, academic. Therefore the visit was completed with audio only.  Persons Participating in Visit: Patient.  AWV Questionnaire: No: Patient Medicare AWV questionnaire was not completed prior to this visit.  Cardiac Risk Factors include: advanced age (>47men, >3 women);dyslipidemia;diabetes mellitus;hypertension     Objective:    Today's Vitals   05/30/24 1408  Weight: 179 lb (81.2 kg)  Height: 5' 5 (1.651 m)   Body mass index is 29.79 kg/m.     05/30/2024    2:08 PM 05/30/2023    1:20 PM 05/04/2022    1:49 PM 02/15/2022    3:24 PM 01/20/2020    9:52 AM 10/11/2018    4:00 PM 08/01/2018    1:59 PM  Advanced Directives  Does Patient Have a Medical Advance Directive? Yes Yes Yes No Yes No  Yes   Type of Sports coach of [place];Living will Healthcare Power of [place];Living will Healthcare Power of [employer] of [place];Living will  Healthcare Power of [place];Living will  Does patient want to make changes to medical advance directive? No - Patient declined No - Patient declined No - Patient declined      Copy of Healthcare Power of Attorney in Chart? Yes - validated most recent copy scanned in chart (See row information) Yes - validated most recent copy scanned in chart (See row information) Yes - validated most recent copy scanned in chart (See row information)    No - copy requested   Would patient like information on creating a medical advance directive?    No - Patient declined  No - Patient declined       Data saved with a previous flowsheet row definition    Current Medications (verified) Outpatient Encounter Medications as of 05/30/2024  Medication Sig   ACCU-CHEK GUIDE TEST test strip    albuterol  (VENTOLIN  HFA) 108 (90 Base) MCG/ACT inhaler Inhale 2 puffs into the lungs every 6 (six) hours as needed for wheezing or shortness of breath.   amLODipine  (NORVASC ) 5 MG tablet TAKE 1 TABLET BY MOUTH EVERY DAY   B-D UF III MINI PEN NEEDLES 31G X 5 MM MISC USE AS DIRECTED WITH INSULIN  4 TIMES A DAY   budesonide -formoterol  (SYMBICORT ) 80-4.5 MCG/ACT inhaler Inhale 2 puffs into the lungs as needed.   Cholecalciferol (VITAMIN D -3 PO) Take by mouth.   Continuous Blood Gluc Sensor (DEXCOM  G6 SENSOR) MISC Apply new sensor every 10 days for continuous glucose monitoring.   Continuous Blood Gluc Transmit (DEXCOM G6 TRANSMITTER) MISC Use one transmitter every 3 months for continuous glucose monitoring.   Cyanocobalamin  (VITAMIN B12) 1000 MCG TABS Take by mouth.   DULoxetine  (CYMBALTA ) 60 MG capsule Take 1 capsule (60 mg total) by mouth 2 (two) times daily.   fluocinolone (SYNALAR) 0.01 % external solution 1 Application.   GVOKE HYPOPEN  1-PACK 1 MG/0.2ML SOAJ Inject  under skin 0.2 ml as needed for hypoglycemia   insulin  aspart (NOVOLOG  FLEXPEN) 100 UNIT/ML FlexPen INJECT 18-22 UNITS UNDER THE SKIN THREE TIMES DAILY   insulin  degludec (TRESIBA  FLEXTOUCH) 100 UNIT/ML FlexTouch Pen Inject up to 36-38 units into the skin daily   ketoconazole  (NIZORAL ) 2 % shampoo AS DIRECTED EXTERNALLY 2-3 TIME A WEEK 30 DAYS; Duration: 30   pravastatin  (PRAVACHOL ) 40 MG tablet TAKE 1 TABLET(40 MG) BY MOUTH EVERY EVENING   risankizumab -rzaa (SKYRIZI  PEN) 150 MG/ML pen Inject 150mg  into the skin every 12 weeks   No facility-administered encounter medications on file as of 05/30/2024.    Allergies (verified) Carboplatin , Remicade [infliximab], Other, Rifampin, and Hydromorphone    History: Past Medical History:  Diagnosis Date   Cancer (HCC) dx'd 09/2013   Lung ca   Concussion 2018   Diabetes (HCC) 09/08/2016   type I diabetic patient reported on 09/13/16    Diabetes mellitus    insulin    Diabetic neuropathy (HCC) 09/08/2016   Fall 2018   GERD (gastroesophageal reflux disease)    no meds for   History of colitis    History of hiatal hernia    Hx of radiation therapy 12/16/13-01/30/14   lung 66Gy   Hypertension    Malignant neoplasm of right upper lobe of lung (HCC) 11/28/2013   Neuropathy    Psoriasis    Psoriatic arthritis (HCC) 09/08/2016   Rheumatoid arthritis (HCC)    Shortness of breath dyspnea    with exertion   UTI (urinary tract infection) 03/31/2024   on going for 2 months   Past Surgical History:  Procedure Laterality Date   ABDOMINAL HYSTERECTOMY     ABDOMINAL SURGERY     BALLOON DILATION N/A 12/12/2014   Procedure: BALLOON DILATION;  Surgeon: Lamar JONETTA Aho, MD;  Location: [facility];  Service: Endoscopy;  Laterality: N/A;   BIOPSY  01/20/2020   Procedure: BIOPSY;  Surgeon: Legrand Victory LITTIE DOUGLAS, MD;  Location: [facility];  Service: Gastroenterology;;   CHOLECYSTECTOMY     COLONOSCOPY N/A 02/07/2016   Procedure: COLONOSCOPY;  Surgeon: Victory LITTIE Legrand DOUGLAS, MD;  Location: [facility];  Service: Endoscopy;  Laterality: N/A;   DILATION AND CURETTAGE OF UTERUS     ESOPHAGOGASTRODUODENOSCOPY N/A 12/12/2014   Procedure: ESOPHAGOGASTRODUODENOSCOPY (EGD);  Surgeon: Lamar JONETTA Aho, MD;  Location: [facility];  Service: Endoscopy;  Laterality: N/A;  with dilation   ESOPHAGOGASTRODUODENOSCOPY (EGD) WITH PROPOFOL  N/A 10/09/2014   Procedure: ESOPHAGOGASTRODUODENOSCOPY (EGD) WITH PROPOFOL ;  Surgeon: Lamar JONETTA Aho, MD;  Location: [facility] END[facility]COPY;  Service: Endoscopy;  Laterality: N/A;   ESOPHAGOGASTRODUODENOSCOPY (EGD) WITH PROPOFOL  N/A 09/19/2016   Procedure: ESOPHAGOGASTRODUODENOSCOPY (EGD) WITH PROPOFOL ;  Surgeon: Victory LITTIE Legrand DOUGLAS, MD;  Location: [facility];  Service: Gastroenterology;  Laterality: N/A;  with savary dil tt   ESOPHAGOGASTRODUODENOSCOPY (EGD) WITH PROPOFOL  N/A 01/20/2020   Procedure: ESOPHAGOGASTRODUODENOSCOPY (EGD) WITH PROPOFOL ;  Surgeon: Legrand Victory LITTIE DOUGLAS, MD;  Location: [facility];  Service: Gastroenterology;  Laterality: N/A;  Fluoro-Savary   ORIF ANKLE  FRACTURE Right 07/04/2015   Procedure: OPEN REDUCTION INTERNAL FIXATION (ORIF) ANKLE FRACTURE;  Surgeon: Jerona Harden GAILS, MD;  Location: [facility] OR;  Service: Orthopedics;  Laterality: Right;  OPEN REDUCTION, INTERNAL FIXATION OF RIGHT ANKLE FRACTURE.    ORIF ANKLE FRACTURE Left 02/10/2016   Procedure: OPEN REDUCTION INTERNAL FIXATION (ORIF) ANKLE FRACTURE;  Surgeon: Jerona Harden GAILS, MD;  Location: [facility] OR;  Service: Orthopedics;  Laterality: Left;   SAVORY DILATION N/A 10/09/2014   Procedure: SAVORY DILATION;  Surgeon: Lamar JONETTA Aho, MD;  Location: [facility] ENDOSCOPY;  Service: Endoscopy;  Laterality: N/A;   SAVORY DILATION N/A 09/19/2016   Procedure: SAVORY DILATION;  Surgeon: Victory LITTIE Legrand DOUGLAS, MD;  Location: [facility];  Service: Gastroenterology;  Laterality: N/A;   SAVORY DILATION N/A 01/20/2020   Procedure: SAVORY DILATION;  Surgeon: Legrand Victory LITTIE DOUGLAS, MD;  Location: [facility];   Service: Gastroenterology;  Laterality: N/A;   TONSILLECTOMY     Family History  Problem Relation Age of Onset   Other Mother    Anuerysm Mother        brain   Other Father        failure to thrive   Hypertension Other    Stroke Other    Heart attack Other    Hemachromatosis Other        great grandfather   Rheum arthritis Other        both sets of grandparents and father   Migraines Neg Hx    Colon cancer Neg Hx    Colon polyps Neg Hx    Esophageal cancer Neg Hx    Pancreatic cancer Neg Hx    Stomach cancer Neg Hx    Liver disease Neg Hx    Social History   Socioeconomic History   Marital status: Divorced    Spouse name: Not on file   Number of children: 0   Years of education: MA   Highest education level: Master's degree (e.g., MA, MS, MEng, MEd, MSW, MBA)  Occupational History   Occupation: Retired    Associate Professor: OTHER  Tobacco Use   Smoking status: Former    Current packs/day: 0.00    Average packs/day: 3.0 packs/day for 45.0 years (135.0 ttl pk-yrs)    Types: Cigarettes    Start date: 12/05/1952    Quit date: 12/05/1997    Years since quitting: 26.5    Passive exposure: Never   Smokeless tobacco: Never  Vaping Use   Vaping status: Never Used  Substance and Sexual Activity   Alcohol use: Yes    Comment: rarely    Drug use: No   Sexual activity: Not Currently  Other Topics Concern   Not on file  Social History Narrative   Patient lives at home. She has an old friend who is her caregiver, Zachary Bevel, who stays with her 24/7.   Caffeine Use: 1 cups daily   Right handed   Social Drivers of Health   Financial Resource Strain: Low Risk  (05/30/2024)   Overall Financial Resource Strain (CARDIA)    Difficulty of Paying Living Expenses: Not hard at all  Food Insecurity: No Food Insecurity (05/30/2024)   Hunger Vital Sign    Worried About Running Out of Food in the Last Year: Never true    Ran Out of Food in the Last Year: Never true  Transportation  Needs: No Transportation Needs (05/30/2024)   PRAPARE - Transportation    Lack of Transportation (Medical): No    Lack of  Transportation (Non-Medical): No  Physical Activity: Insufficiently Active (05/30/2024)   Exercise Vital Sign    Days of Exercise per Week: 5 days    Minutes of Exercise per Session: 10 min  Stress: No Stress Concern Present (05/30/2024)   [employer] of Occupational Health - Occupational Stress Questionnaire    Feeling of Stress: Not at all  Social Connections: Moderately Integrated (05/30/2024)   Social Connection and Isolation Panel    Frequency of Communication with Friends and Family: Once a week    Frequency of Social Gatherings with Friends and Family: More than three times a week    Attends Religious Services: 1 to 4 times per year    Active Member of [employer] or Organizations: Yes    Attends Banker Meetings: 1 to 4 times per year    Marital Status: Divorced    Tobacco Counseling Counseling given: No    Clinical Intake:  Pre-visit preparation completed: Yes  Pain : No/denies pain     BMI - recorded: 29.79 Nutritional Status: BMI 25 -29 Overweight Nutritional Risks: None Diabetes: Yes CBG done?: Yes CBG resulted in Enter/ Edit results?: Yes (fasting - 98) Did pt. bring in CBG monitor from home?: No  Lab Results  Component Value Date   HGBA1C 9.1 (H) 12/28/2023   HGBA1C 7.8 (H) 06/14/2023   HGBA1C 8.1 (A) 05/24/2022     How often do you need to have someone help you when you read instructions, pamphlets, or other written materials from your doctor or pharmacy?: 1 - Never  Interpreter Needed?: No  Information entered by :: Verdie Saba, CMA   Activities of Daily Living     05/30/2024    2:10 PM  In your present state of health, do you have any difficulty performing the following activities:  Hearing? 1  Comment wears hearing aids  Vision? 0  Difficulty concentrating or making decisions? 0  Walking or climbing  stairs? 0  Dressing or bathing? 0  Doing errands, shopping? 0  Preparing Food and eating ? N  Using the Toilet? N  In the past six months, have you accidently leaked urine? N  Do you have problems with loss of bowel control? N  Managing your Medications? N  Managing your Finances? N  Housekeeping or managing your Housekeeping? N    Patient Care Team: Geofm Glade PARAS, MD as PCP - General (Internal Medicine) Francyne Headland, MD as PCP - Cardiology (Cardiology) Sherrod Sherrod, MD as Consulting Physician (Oncology) Leslee Reusing, MD as Consulting Physician (Ophthalmology)  I have updated your Care Teams any recent Medical Services you may have received from other providers in the past year.     Assessment:   This is a routine wellness examination for Megan [name].  Hearing/Vision screen Hearing Screening - Comments:: Denies hearing difficulties   Vision Screening - Comments:: Denies vision concerns - up-to-date with Dr Leslee   Goals Addressed               This Visit's Progress     Patient Stated (pt-stated)        Patient stated she plans to stay active       Depression Screen     05/30/2024    2:11 PM 06/14/2023    1:40 PM 05/30/2023    1:26 PM 07/15/2022    8:31 AM 05/04/2022    1:42 PM 08/04/2021    3:30 PM 10/09/2020    9:55 AM  PHQ 2/9 Scores  PHQ - 2  Score 0 0 0 0 0 0 0  PHQ- 9 Score 0 9 4        Fall Risk     05/30/2024    2:10 PM 12/28/2023    8:33 AM 06/14/2023    1:40 PM 05/30/2023    1:21 PM 07/15/2022    8:31 AM  Fall Risk   Falls in the past year? 0 0 0 0 0  Number falls in past yr: 0 0  0 0  Injury with Fall? 0 0 0 0 0  Risk for fall due to : No Fall Risks No Fall Risks No Fall Risks No Fall Risks No Fall Risks  Follow up Falls evaluation completed;Falls prevention discussed Falls evaluation completed Falls evaluation completed Falls prevention discussed Falls evaluation completed      Data saved with a previous flowsheet row definition     MEDICARE RISK AT HOME:  Medicare Risk at Home Any stairs in or around the home?: No If so, are there any without handrails?: No Home free of loose throw rugs in walkways, pet beds, electrical cords, etc?: Yes Adequate lighting in your home to reduce risk of falls?: Yes Life alert?: No Use of a cane, walker or w/c?: No Grab bars in the bathroom?: Yes Shower chair or bench in shower?: Yes Elevated toilet seat or a handicapped toilet?: Yes  TIMED UP AND GO:  Was the test performed?  No  Cognitive Function: 6CIT completed    03/19/2018   11:54 AM  MMSE - Mini Mental State Exam  Orientation to time 5  Orientation to Place 5  Registration 3  Attention/ Calculation 5  Recall 1  Language- name 2 objects 2  Language- repeat 1  Language- follow 3 step command 3  Language- read & follow direction 1  Write a sentence 1  Copy design 1  Total score 28        05/30/2024    2:16 PM 05/30/2023    1:22 PM 05/04/2022    1:52 PM  6CIT Screen  What Year? 0 points 0 points 0 points  What month? 0 points 0 points 0 points  What time? 0 points 0 points 0 points  Count back from 20 0 points 0 points 0 points  Months in reverse 0 points 0 points 0 points  Repeat phrase 0 points 0 points 0 points  Total Score 0 points 0 points 0 points    Immunizations Immunization History  Administered Date(s) Administered   Fluad Quad(high Dose 65+) 07/31/2019, 10/01/2020, 07/15/2022   Influenza, High Dose Seasonal PF 08/12/2016, 08/30/2017, 09/07/2018, 08/24/2021   Influenza,inj,Quad PF,6+ Mos 11/07/2013, 07/14/2014, 08/25/2015   Influenza-Unspecified 08/07/2018, 09/07/2018   PFIZER(Purple Top)SARS-COV-2 Vaccination 11/28/2019, 12/25/2019, 06/18/2020   PPD Test 03/02/2016   Pfizer Covid-19 Vaccine Bivalent Booster 82yrs & up 07/27/2021   Pfizer(Comirnaty )Fall Seasonal Vaccine 12 years and older 08/17/2023   Pneumococcal Conjugate-13 05/10/2019   Pneumococcal Polysaccharide-23 03/19/2018    Pneumococcal-Unspecified 12/02/2015   Tdap 01/11/2017   Zoster Recombinant(Shingrix) 02/14/2018, 08/07/2018    Screening Tests Health Maintenance  Topic Date Due   Diabetic kidney evaluation - Urine ACR  Never done   FOOT EXAM  01/25/2023   OPHTHALMOLOGY EXAM  10/20/2023   COVID-19 Vaccine (6 - Pfizer risk 2024-25 season) 02/15/2024   INFLUENZA VACCINE  05/31/2024   HEMOGLOBIN A1C  06/26/2024   Diabetic kidney evaluation - eGFR measurement  05/22/2025   Medicare Annual Wellness (AWV)  05/30/2025   DTaP/Tdap/Td (2 - Td  or Tdap) 01/12/2027   Pneumococcal Vaccine: 50+ Years  Completed   Zoster Vaccines- Shingrix  Completed   Hepatitis B Vaccines  Aged Out   HPV VACCINES  Aged Out   Meningococcal B Vaccine  Aged Out   DEXA SCAN  Discontinued   Hepatitis C Screening  Discontinued    Health Maintenance  Health Maintenance Due  Topic Date Due   Diabetic kidney evaluation - Urine ACR  Never done   FOOT EXAM  01/25/2023   OPHTHALMOLOGY EXAM  10/20/2023   COVID-19 Vaccine (6 - Pfizer risk 2024-25 season) 02/15/2024   Health Maintenance Items Addressed: 05/30/2024   Additional Screening:  Vision Screening: Recommended annual ophthalmology exams for early detection of glaucoma and other disorders of the eye. Would you like a referral to an eye doctor? No    Dental Screening: Recommended annual dental exams for proper oral hygiene  Community Resource Referral / Chronic Care Management: CRR required this visit?  No   CCM required this visit?  No   Plan:    I have personally reviewed and noted the following in the patient's chart:   Medical and social history Use of alcohol, tobacco or illicit drugs  Current medications and supplements including opioid prescriptions. Patient is not currently taking opioid prescriptions. Functional ability and status Nutritional status Physical activity Advanced directives List of other physicians Hospitalizations, surgeries, and ER  visits in previous 12 months Vitals Screenings to include cognitive, depression, and falls Referrals and appointments  In addition, I have reviewed and discussed with patient certain preventive protocols, quality metrics, and best practice recommendations. A written personalized care plan for preventive services as well as general preventive health recommendations were provided to patient.   Verdie CHRISTELLA Saba, CMA   05/30/2024   After Visit Summary: (Declined) Due to this being a telephonic visit, with patients personalized plan was offered to patient but patient Declined AVS at this time   Notes: Nothing significant to report at this time.

## 2024-06-05 ENCOUNTER — Other Ambulatory Visit: Payer: Self-pay | Admitting: Internal Medicine

## 2024-06-05 DIAGNOSIS — N3941 Urge incontinence: Secondary | ICD-10-CM | POA: Diagnosis not present

## 2024-06-05 DIAGNOSIS — N3020 Other chronic cystitis without hematuria: Secondary | ICD-10-CM | POA: Diagnosis not present

## 2024-06-05 DIAGNOSIS — N952 Postmenopausal atrophic vaginitis: Secondary | ICD-10-CM | POA: Diagnosis not present

## 2024-06-25 NOTE — Patient Instructions (Incomplete)
      Medications changes include :   None       Return in about 1 year (around 06/26/2025) for Physical Exam.

## 2024-06-25 NOTE — Progress Notes (Unsigned)
 Subjective:    Patient ID: Megan [name], female    DOB: 1941/10/10, 83 y.o.   MRN: [id number]     HPI Megan [name] is here for follow up of her chronic medical problems.  Q 6 mo or 1 yr  Medications and allergies reviewed with patient and updated if appropriate.  Current Outpatient Medications on File Prior to Visit  Medication Sig Dispense Refill   ACCU-CHEK GUIDE TEST test strip      albuterol  (VENTOLIN  HFA) 108 (90 Base) MCG/ACT inhaler Inhale 2 puffs into the lungs every 6 (six) hours as needed for wheezing or shortness of breath. 1 each 2   amLODipine  (NORVASC ) 5 MG tablet TAKE 1 TABLET BY MOUTH EVERY DAY 90 tablet 2   B-D UF III MINI PEN NEEDLES 31G X 5 MM MISC USE AS DIRECTED WITH INSULIN  4 TIMES A DAY 100 each 3   budesonide -formoterol  (SYMBICORT ) 80-4.5 MCG/ACT inhaler Inhale 2 puffs into the lungs as needed. 6.9 g 5   Cholecalciferol (VITAMIN D -3 PO) Take by mouth.     Continuous Blood Gluc Sensor (DEXCOM G6 SENSOR) MISC Apply new sensor every 10 days for continuous glucose monitoring. 9 each 3   Continuous Blood Gluc Transmit (DEXCOM G6 TRANSMITTER) MISC Use one transmitter every 3 months for continuous glucose monitoring. 1 each 0   Cyanocobalamin  (VITAMIN B12) 1000 MCG TABS Take by mouth.     DULoxetine  (CYMBALTA ) 60 MG capsule TAKE 1 CAPSULE BY MOUTH 2 TIMES DAILY. 180 capsule 1   fluocinolone (SYNALAR) 0.01 % external solution 1 Application.     GVOKE HYPOPEN  1-PACK 1 MG/0.2ML SOAJ Inject under skin 0.2 ml as needed for hypoglycemia 0.2 mL prn   insulin  aspart (NOVOLOG  FLEXPEN) 100 UNIT/ML FlexPen INJECT 18-22 UNITS UNDER THE SKIN THREE TIMES DAILY 45 mL 3   insulin  degludec (TRESIBA  FLEXTOUCH) 100 UNIT/ML FlexTouch Pen Inject up to 36-38 units into the skin daily 30 mL 3   ketoconazole  (NIZORAL ) 2 % shampoo AS DIRECTED EXTERNALLY 2-3 TIME A WEEK 30 DAYS; Duration: 30     pravastatin  (PRAVACHOL ) 40 MG tablet TAKE 1 TABLET(40 MG) BY MOUTH EVERY EVENING 90  tablet 2   risankizumab -rzaa (SKYRIZI  PEN) 150 MG/ML pen Inject 150mg  into the skin every 12 weeks 1 mL 0   No current facility-administered medications on file prior to visit.     Review of Systems     Objective:  There were no vitals filed for this visit. BP Readings from Last 3 Encounters:  05/30/24 110/76  05/22/24 116/73  05/14/24 110/80   Wt Readings from Last 3 Encounters:  05/30/24 179 lb (81.2 kg)  05/30/24 179 lb 6 oz (81.4 kg)  05/22/24 181 lb 9.6 oz (82.4 kg)   There is no height or weight on file to calculate BMI.    Physical Exam     Lab Results  Component Value Date   WBC 7.5 05/22/2024   HGB 13.6 05/22/2024   HCT 41.7 05/22/2024   PLT 202 05/22/2024   GLUCOSE 137 (H) 05/22/2024   CHOL 150 12/28/2023   TRIG 111.0 12/28/2023   HDL 49.00 12/28/2023   LDLDIRECT 156.0 05/10/2019   LDLCALC 79 12/28/2023   ALT 13 05/22/2024   AST 15 05/22/2024   NA 142 05/22/2024   K 3.8 05/22/2024   CL 102 05/22/2024   CREATININE 0.84 05/22/2024   BUN 18 05/22/2024   CO2 31 05/22/2024   TSH 5.87 (H) 12/28/2023   INR  1.17 10/10/2018   HGBA1C 9.1 (H) 12/28/2023     Assessment & Plan:    See Problem List for Assessment and Plan of chronic medical problems.

## 2024-06-26 ENCOUNTER — Ambulatory Visit: Payer: Medicare PPO | Admitting: Internal Medicine

## 2024-06-26 ENCOUNTER — Encounter: Payer: Self-pay | Admitting: Internal Medicine

## 2024-06-26 VITALS — BP 112/64 | HR 68 | Temp 98.0°F | Ht 65.0 in | Wt 181.0 lb

## 2024-06-26 DIAGNOSIS — J438 Other emphysema: Secondary | ICD-10-CM

## 2024-06-26 DIAGNOSIS — E038 Other specified hypothyroidism: Secondary | ICD-10-CM | POA: Diagnosis not present

## 2024-06-26 DIAGNOSIS — L4050 Arthropathic psoriasis, unspecified: Secondary | ICD-10-CM | POA: Diagnosis not present

## 2024-06-26 DIAGNOSIS — E1349 Other specified diabetes mellitus with other diabetic neurological complication: Secondary | ICD-10-CM | POA: Diagnosis not present

## 2024-06-26 DIAGNOSIS — E782 Mixed hyperlipidemia: Secondary | ICD-10-CM | POA: Diagnosis not present

## 2024-06-26 DIAGNOSIS — G620 Drug-induced polyneuropathy: Secondary | ICD-10-CM | POA: Diagnosis not present

## 2024-06-26 DIAGNOSIS — I10 Essential (primary) hypertension: Secondary | ICD-10-CM

## 2024-06-26 DIAGNOSIS — T451X5A Adverse effect of antineoplastic and immunosuppressive drugs, initial encounter: Secondary | ICD-10-CM | POA: Diagnosis not present

## 2024-06-26 DIAGNOSIS — E1149 Type 2 diabetes mellitus with other diabetic neurological complication: Secondary | ICD-10-CM

## 2024-06-26 DIAGNOSIS — J439 Emphysema, unspecified: Secondary | ICD-10-CM

## 2024-06-26 DIAGNOSIS — E139 Other specified diabetes mellitus without complications: Secondary | ICD-10-CM

## 2024-06-26 MED ORDER — BUDESONIDE-FORMOTEROL FUMARATE 80-4.5 MCG/ACT IN AERO: 2.0000 | INHALATION_SPRAY | RESPIRATORY_TRACT | 5 refills | Status: AC | PRN

## 2024-06-26 MED ORDER — ALBUTEROL SULFATE HFA 108 (90 BASE) MCG/ACT IN AERS: 2.0000 | INHALATION_SPRAY | Freq: Four times a day (QID) | RESPIRATORY_TRACT | 2 refills | Status: AC | PRN

## 2024-06-26 MED ORDER — DULOXETINE HCL 60 MG PO CPEP: 60.0000 mg | ORAL_CAPSULE | Freq: Two times a day (BID) | ORAL | 3 refills | Status: AC

## 2024-06-26 NOTE — Assessment & Plan Note (Signed)
 Chronic euthyroid

## 2024-06-26 NOTE — Assessment & Plan Note (Signed)
 Chronic No longer seeing neurology Continue cymbalta 60 mg bid

## 2024-06-26 NOTE — Assessment & Plan Note (Signed)
 Chronic Management per endocrine-following with Megan [name] Avg sugars 221 for the past three months - encouraged her to try to get this less than 200 Currently on insulin 

## 2024-06-26 NOTE — Assessment & Plan Note (Signed)
 Chronic ? Slightly worse in left hand Encouraged better diabetic control Continue Cymbalta  60 mg twice daily

## 2024-06-26 NOTE — Assessment & Plan Note (Signed)
 Chronic States dry cough, shortness of breath with exertion wheezing - chronic Not currently using her inhalers, but feels she should restart them Restart Symbicort  80-4.5 mcg per ACT twice daily and albuterol  inhaler as needed - sent to pharmacy

## 2024-06-26 NOTE — Assessment & Plan Note (Signed)
 Chronic Management per rheumatology On [employer]

## 2024-06-26 NOTE — Assessment & Plan Note (Signed)
Chronic Blood pressure well controlled Continue amlodipine 5 mg daily 

## 2024-06-26 NOTE — Assessment & Plan Note (Signed)
Chronic Regular exercise and healthy diet encouraged Continue pravastatin 40 mg daily

## 2024-07-28 DIAGNOSIS — E1065 Type 1 diabetes mellitus with hyperglycemia: Secondary | ICD-10-CM | POA: Diagnosis not present

## 2024-07-31 DIAGNOSIS — E1065 Type 1 diabetes mellitus with hyperglycemia: Secondary | ICD-10-CM | POA: Diagnosis not present

## 2024-07-31 DIAGNOSIS — E782 Mixed hyperlipidemia: Secondary | ICD-10-CM | POA: Diagnosis not present

## 2024-07-31 DIAGNOSIS — I10 Essential (primary) hypertension: Secondary | ICD-10-CM | POA: Diagnosis not present

## 2024-08-05 ENCOUNTER — Telehealth: Payer: Self-pay

## 2024-08-05 ENCOUNTER — Other Ambulatory Visit: Payer: Self-pay

## 2024-08-05 DIAGNOSIS — C3490 Malignant neoplasm of unspecified part of unspecified bronchus or lung: Secondary | ICD-10-CM

## 2024-08-05 NOTE — Telephone Encounter (Signed)
 Spoke with patient in regards to follow up for next year.  Scheduled patient on 02/12/25 for labs @ 9 AM and CT scan at 10 AM then a follow up visit with Dr. Sherrod on 02/19/25 @ 9 AM. She voiced understanding.

## 2024-08-06 ENCOUNTER — Encounter: Payer: Self-pay | Admitting: Emergency Medicine

## 2024-08-06 ENCOUNTER — Ambulatory Visit: Admitting: Emergency Medicine

## 2024-08-06 VITALS — BP 126/86 | HR 115 | Temp 98.1°F | Ht 65.0 in | Wt 178.0 lb

## 2024-08-06 DIAGNOSIS — B029 Zoster without complications: Secondary | ICD-10-CM

## 2024-08-06 MED ORDER — VALACYCLOVIR HCL 1 G PO TABS: 1000.0000 mg | ORAL_TABLET | Freq: Two times a day (BID) | ORAL | 0 refills | Status: AC

## 2024-08-06 MED ORDER — CLOBETASOL PROPIONATE 0.05 % EX CREA: 1.0000 | TOPICAL_CREAM | Freq: Two times a day (BID) | CUTANEOUS | 0 refills | Status: AC

## 2024-08-06 NOTE — Patient Instructions (Signed)
 Shingles  Shingles, or herpes zoster, is an infection. It gives you a skin rash and blisters. These infected areas may hurt a lot. Shingles only happens if: You've had chickenpox. You've been given a shot called a vaccine to protect you from getting chickenpox. Shingles is rare in this case. What are the causes? Shingles is caused by a germ called the varicella-zoster virus. This is the same germ that causes chickenpox. After you're exposed to the germ, it stays in your body but is dormant. This means it isn't active. Shingles happens if the germ becomes active again. This can happen years after you're first exposed to the germ. What increases the risk? You may be more likely to get shingles if: You're older than 82 years of age. You're under a lot of stress. You have a weak immune system. The immune system is your body's defense system. It may be weak if: You have human immunodeficiency virus (HIV). You have acquired immunodeficiency syndrome (AIDS). You have cancer. You take medicines that weaken your immune system. These include organ transplant medicines. What are the signs or symptoms? The first symptoms of shingles may be itching, tingling, or pain. Your skin may feel like it's burning. A few days or weeks later, you'll get a rash. Here's what you can expect: The rash is likely to be on one side of your body. The rash may be shaped like a belt or a band. Over time, it will turn into blisters filled with fluid. The blisters will break open and change into scabs. The scabs will dry up in about 2-3 weeks. You may also have: A fever. Chills. A headache. Nausea. How is this diagnosed? Shingles is diagnosed with a skin exam. A sample called a culture may be taken from one of your blisters and sent to a lab. This will show if you have shingles. How is this treated? The rash may last for several weeks. There's no cure for shingles, but your health care provider may give you medicines.  These medicines may: Help with pain. Help with itching. Help with irritation and swelling. Help you get better sooner. Help to prevent long-term problems. If the rash is on your face, you may need to see an eye doctor or an ear, nose, and throat (ENT) doctor. Follow these instructions at home: Medicines Take your medicines only as told by your provider. Put an anti-itch cream or numbing cream on the rash or blisters as told by your provider. Relieving itching and discomfort  To help with itching: Put cold, wet cloths called cold compresses on the rash or blisters. Take a cool bath. Try adding baking soda or dry oatmeal to the water. Do not bathe in hot water. Use calamine lotion on the rash or blisters. You can get this type of lotion at the store. Blister and rash care Keep your rash covered with a loose bandage. Wear loose clothes that don't rub on your rash. Take care of your rash as told by your provider. Make sure you: Wash your hands with soap and water for at least 20 seconds before and after you change your bandage. If you can't use soap and water, use hand sanitizer. Keep your rash and blisters clean by washing them with mild soap and cool water. Change your bandage. Check your rash every day for signs of infection. Check for: More redness, swelling, or pain. Fluid or blood. Warmth. Pus or a bad smell. Do not scratch your rash. Do not pick at your  blisters. To help you not scratch: Keep your fingernails clean and cut short. Try to wear gloves or mittens when you sleep. General instructions Rest. Wash your hands often with soap and water for at least 20 seconds. If you can't use soap and water, use hand sanitizer. Washing your hands lowers your chance of getting a skin infection. Your infection can cause chickenpox in others. If you have blisters that aren't scabs yet, stay away from: Babies. Pregnant people. Children who have eczema. Older people who have organ  transplants. People who have a long-term, or chronic, illness. Anyone who hasn't had chickenpox before. Anyone who hasn't gotten the chickenpox vaccine. How is this prevented? Vaccines are the best way to prevent you from getting chickenpox or shingles. Talk with your provider about getting these shots. Where to find more information Centers for Disease Control and Prevention (CDC): TonerPromos.no Contact a health care provider if: Your pain doesn't get better with medicine. Your pain doesn't get better after the rash heals. You have any signs of infection around the rash. Your rash or blisters get worse. You have a fever or chills. Get help right away if: The rash is on your face or nose. You have pain in your face or by your eye. You lose feeling on one side of your face. You have trouble seeing. You have ear pain or ringing in your ear. This information is not intended to replace advice given to you by your health care provider. Make sure you discuss any questions you have with your health care provider. Document Revised: 07/20/2023 Document Reviewed: 12/02/2022 Elsevier Patient Education  2024 [employer].

## 2024-08-06 NOTE — Addendum Note (Signed)
 Addended by: Jocee Kissick J on: 08/06/2024 01:48 PM   Modules accepted: Orders

## 2024-08-06 NOTE — Progress Notes (Signed)
 Megan [name] 83 y.o.   Chief Complaint  Patient presents with   Rash    Patient here for rash on right breast. Pt says been there for 8 days, red, itchy, and painful. Patient has been putting hydrocortisone  cream says it helped the first couple of days      HISTORY OF PRESENT ILLNESS: Acute problem visit today. This is a 83 y.o. female complaining of pain to right breast area for couple days followed by itchy red painful rash No other associated symptoms. No other complaints or medical concerns today.  Rash Pertinent negatives include no congestion, cough, diarrhea, fever, shortness of breath, sore throat or vomiting.     Prior to Admission medications   Medication Sig Start Date End Date Taking? Authorizing Provider  ACCU-CHEK GUIDE TEST test strip  07/13/23  Yes [provider]  albuterol  (VENTOLIN  HFA) 108 (90 Base) MCG/ACT inhaler Inhale 2 puffs into the lungs every 6 (six) hours as needed for wheezing or shortness of breath. 06/26/24  Yes Burns, Glade PARAS, MD  amLODipine  (NORVASC ) 5 MG tablet TAKE 1 TABLET BY MOUTH EVERY DAY 05/14/24  Yes Conte, Tessa N, PA-C  B-D UF III MINI PEN NEEDLES 31G X 5 MM MISC USE AS DIRECTED WITH INSULIN  4 TIMES A DAY 03/17/22  Yes Trixie File, MD  budesonide -formoterol  (SYMBICORT ) 80-4.5 MCG/ACT inhaler Inhale 2 puffs into the lungs as needed. 06/26/24  Yes Burns, Glade PARAS, MD  Cholecalciferol (VITAMIN D -3 PO) Take by mouth.   Yes [provider]  Continuous Blood Gluc Sensor (DEXCOM G6 SENSOR) MISC Apply new sensor every 10 days for continuous glucose monitoring. 08/06/20  Yes Trixie File, MD  Continuous Blood Gluc Transmit (DEXCOM G6 TRANSMITTER) MISC Use one transmitter every 3 months for continuous glucose monitoring. 11/25/20  Yes Trixie File, MD  Cyanocobalamin  (VITAMIN B12) 1000 MCG TABS Take by mouth.   Yes [provider]  DULoxetine  (CYMBALTA ) 60 MG capsule Take 1 capsule (60 mg total) by mouth 2  (two) times daily. 06/26/24  Yes Burns, Glade PARAS, MD  fluocinolone (SYNALAR) 0.01 % external solution 1 Application.   Yes [provider]  GVOKE HYPOPEN  1-PACK 1 MG/0.2ML SOAJ Inject under skin 0.2 ml as needed for hypoglycemia 07/13/21  Yes Trixie File, MD  insulin  aspart (NOVOLOG  FLEXPEN) 100 UNIT/ML FlexPen INJECT 18-22 UNITS UNDER THE SKIN THREE TIMES DAILY 01/24/22  Yes Trixie File, MD  insulin  degludec (TRESIBA  FLEXTOUCH) 100 UNIT/ML FlexTouch Pen Inject up to 36-38 units into the skin daily 01/24/22  Yes Trixie File, MD  ketoconazole  (NIZORAL ) 2 % shampoo AS DIRECTED EXTERNALLY 2-3 TIME A WEEK 30 DAYS; Duration: 30   Yes [provider]  pravastatin  (PRAVACHOL ) 40 MG tablet TAKE 1 TABLET(40 MG) BY MOUTH EVERY EVENING 05/14/24  Yes Lucien, Tessa N, PA-C  risankizumab -rzaa (SKYRIZI  PEN) 150 MG/ML pen Inject 150mg  into the skin every 12 weeks 05/21/24  Yes Deveshwar, Maya, MD  trimethoprim (TRIMPEX) 100 MG tablet Take 100 mg by mouth at bedtime. 06/05/24  Yes [provider]  YUVAFEM 10 MCG TABS vaginal tablet SMARTSIG:Tablet(s) Vaginal Twice a Week 06/05/24  Yes [provider]    Allergies  Allergen Reactions   Carboplatin  Anaphylaxis, Shortness Of Breath and Other (See Comments)    Neuropathy   Remicade [Infliximab] Anaphylaxis   Other    Rifampin Other (See Comments)    unknown   Hydromorphone  Rash    Patient Active Problem List   Diagnosis Date Noted   Weight  gain 12/29/2023   Vitamin B12 deficiency 06/14/2023   Asthma 06/14/2023   Subclinical hypothyroidism 06/13/2023   Primary osteoarthritis of left knee 04/10/2023   Keratosis 07/15/2022   Onychomycosis of nail of digit of hand 03/03/2022   Nocturia 11/24/2021   OAB (overactive bladder) 11/24/2021   Urge incontinence 11/24/2021   Urinary urgency 11/24/2021   Vaginal atrophy 11/24/2021   Vitamin D  deficiency 10/12/2021   Mixed hyperlipidemia 10/12/2021   Frequent UTI  10/12/2021   Aortic atherosclerosis 10/09/2020   Fall 05/04/2020   Head pain 05/10/2019   GERD (gastroesophageal reflux disease) 10/17/2018   Herpes zoster without complication 03/01/2018   Right elbow pain 03/01/2018   Carpal tunnel syndrome on right 11/30/2017   Cough 08/22/2017   Primary osteoarthritis of both hands 03/08/2017   Poor balance 02/07/2017   Subarachnoid bleed (HCC) 01/11/2017   Psoriatic arthropathy (HCC) 09/08/2016   Hypertension 09/08/2016   Diabetic neuropathy (HCC) 09/08/2016   LADA (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), managed as type 1 (HCC) 05/04/2016   Syncope 03/25/2016   RLS (restless legs syndrome) 12/11/2015   Lumbago 05/06/2015   Peripheral edema 04/14/2015   Psoriasis 04/14/2015   Emphysema of lung (HCC) 11/26/2014   Radiation-induced esophageal stricture 10/01/2014   DNR (do not resuscitate) discussion 08/21/2014   Fatigue 07/18/2014   Neuropathy due to chemotherapeutic drug 07/18/2014   Muscle cramping 07/18/2014   SOB (shortness of breath) 04/28/2014   Radiation esophagitis 04/10/2014   Cancer of middle lobe of lung (HCC) 01/17/2014   Malignant neoplasm of right upper lobe of lung (HCC) 11/28/2013    Past Medical History:  Diagnosis Date   Cancer (HCC) dx'd 09/2013   Lung ca   Concussion 2018   Diabetes (HCC) 09/08/2016   type I diabetic patient reported on 09/13/16    Diabetes mellitus    insulin    Diabetic neuropathy (HCC) 09/08/2016   Fall 2018   GERD (gastroesophageal reflux disease)    no meds for   History of colitis    History of hiatal hernia    Hx of radiation therapy 12/16/13-01/30/14   lung 66Gy   Hypertension    Malignant neoplasm of right upper lobe of lung (HCC) 11/28/2013   Neuropathy    Psoriasis    Psoriatic arthritis (HCC) 09/08/2016   Rheumatoid arthritis (HCC)    Shortness of breath dyspnea    with exertion   UTI (urinary tract infection) 03/31/2024   on going for 2 months    Past Surgical History:   Procedure Laterality Date   ABDOMINAL HYSTERECTOMY     ABDOMINAL SURGERY     BALLOON DILATION N/A 12/12/2014   Procedure: BALLOON DILATION;  Surgeon: Lamar JONETTA Aho, MD;  Location: [facility];  Service: Endoscopy;  Laterality: N/A;   BIOPSY  01/20/2020   Procedure: BIOPSY;  Surgeon: Legrand Victory LITTIE DOUGLAS, MD;  Location: [facility];  Service: Gastroenterology;;   CHOLECYSTECTOMY     COLONOSCOPY N/A 02/07/2016   Procedure: COLONOSCOPY;  Surgeon: Victory LITTIE Legrand DOUGLAS, MD;  Location: [facility];  Service: Endoscopy;  Laterality: N/A;   DILATION AND CURETTAGE OF UTERUS     ESOPHAGOGASTRODUODENOSCOPY N/A 12/12/2014   Procedure: ESOPHAGOGASTRODUODENOSCOPY (EGD);  Surgeon: Lamar JONETTA Aho, MD;  Location: [facility];  Service: Endoscopy;  Laterality: N/A;  with dilation   ESOPHAGOGASTRODUODENOSCOPY (EGD) WITH PROPOFOL  N/A 10/09/2014   Procedure: ESOPHAGOGASTRODUODENOSCOPY (EGD) WITH PROPOFOL ;  Surgeon: Lamar JONETTA Aho, MD;  Location: [facility]COPY;  Service: Endoscopy;  Laterality: N/A;  ESOPHAGOGASTRODUODENOSCOPY (EGD) WITH PROPOFOL  N/A 09/19/2016   Procedure: ESOPHAGOGASTRODUODENOSCOPY (EGD) WITH PROPOFOL ;  Surgeon: Victory LITTIE Legrand DOUGLAS, MD;  Location: [facility];  Service: Gastroenterology;  Laterality: N/A;  with savary dil tt   ESOPHAGOGASTRODUODENOSCOPY (EGD) WITH PROPOFOL  N/A 01/20/2020   Procedure: ESOPHAGOGASTRODUODENOSCOPY (EGD) WITH PROPOFOL ;  Surgeon: Legrand Victory LITTIE DOUGLAS, MD;  Location: [facility];  Service: Gastroenterology;  Laterality: N/A;  Fluoro-Savary   ORIF ANKLE FRACTURE Right 07/04/2015   Procedure: OPEN REDUCTION INTERNAL FIXATION (ORIF) ANKLE FRACTURE;  Surgeon: Jerona Harden GAILS, MD;  Location: [facility] OR;  Service: Orthopedics;  Laterality: Right;  OPEN REDUCTION, INTERNAL FIXATION OF RIGHT ANKLE FRACTURE.    ORIF ANKLE FRACTURE Left 02/10/2016   Procedure: OPEN REDUCTION INTERNAL FIXATION (ORIF) ANKLE FRACTURE;  Surgeon: Jerona Harden GAILS, MD;  Location: [facility] OR;  Service: Orthopedics;   Laterality: Left;   SAVORY DILATION N/A 10/09/2014   Procedure: SAVORY DILATION;  Surgeon: Lamar JONETTA Aho, MD;  Location: [facility] ENDOSCOPY;  Service: Endoscopy;  Laterality: N/A;   SAVORY DILATION N/A 09/19/2016   Procedure: SAVORY DILATION;  Surgeon: Victory LITTIE Legrand DOUGLAS, MD;  Location: [facility];  Service: Gastroenterology;  Laterality: N/A;   SAVORY DILATION N/A 01/20/2020   Procedure: SAVORY DILATION;  Surgeon: Legrand Victory LITTIE DOUGLAS, MD;  Location: [facility];  Service: Gastroenterology;  Laterality: N/A;   TONSILLECTOMY      Social History   Socioeconomic History   Marital status: Divorced    Spouse name: Not on file   Number of children: 0   Years of education: MA   Highest education level: Master's degree (e.g., MA, MS, MEng, MEd, MSW, MBA)  Occupational History   Occupation: Retired    Associate Professor: OTHER  Tobacco Use   Smoking status: Former    Current packs/day: 0.00    Average packs/day: 3.0 packs/day for 45.0 years (135.0 ttl pk-yrs)    Types: Cigarettes    Start date: 12/05/1952    Quit date: 12/05/1997    Years since quitting: 26.6    Passive exposure: Never   Smokeless tobacco: Never  Vaping Use   Vaping status: Never Used  Substance and Sexual Activity   Alcohol use: Yes    Comment: rarely    Drug use: No   Sexual activity: Not Currently  Other Topics Concern   Not on file  Social History Narrative   Patient lives at home. She has an old friend who is her caregiver, Zachary Bevel, who stays with her 24/7.   Caffeine Use: 1 cups daily   Right handed   Social Drivers of Health   Financial Resource Strain: Low Risk  (05/30/2024)   Overall Financial Resource Strain (CARDIA)    Difficulty of Paying Living Expenses: Not hard at all  Food Insecurity: No Food Insecurity (05/30/2024)   Hunger Vital Sign    Worried About Running Out of Food in the Last Year: Never true    Ran Out of Food in the Last Year: Never true  Transportation Needs: No Transportation Needs  (05/30/2024)   PRAPARE - Administrator, Civil Service (Medical): No    Lack of Transportation (Non-Medical): No  Physical Activity: Insufficiently Active (05/30/2024)   Exercise Vital Sign    Days of Exercise per Week: 5 days    Minutes of Exercise per Session: 10 min  Stress: No Stress Concern Present (05/30/2024)   [employer] of Occupational Health - Occupational Stress Questionnaire    Feeling of Stress: Not at all  Social Connections: Moderately Integrated (05/30/2024)   Social Connection and Isolation Panel    Frequency of Communication with Friends and Family: Once a week    Frequency of Social Gatherings with Friends and Family: More than three times a week    Attends Religious Services: 1 to 4 times per year    Active Member of [employer] or Organizations: Yes    Attends Banker Meetings: 1 to 4 times per year    Marital Status: Divorced  Catering manager Violence: Not At Risk (05/30/2024)   Humiliation, Afraid, Rape, and Kick questionnaire    Fear of Current or Ex-Partner: No    Emotionally Abused: No    Physically Abused: No    Sexually Abused: No    Family History  Problem Relation Age of Onset   Other Mother    Anuerysm Mother        brain   Other Father        failure to thrive   Hypertension Other    Stroke Other    Heart attack Other    Hemachromatosis Other        great grandfather   Rheum arthritis Other        both sets of grandparents and father   Migraines Neg Hx    Colon cancer Neg Hx    Colon polyps Neg Hx    Esophageal cancer Neg Hx    Pancreatic cancer Neg Hx    Stomach cancer Neg Hx    Liver disease Neg Hx      Review of Systems  Constitutional: Negative.  Negative for chills and fever.  HENT:  Negative for congestion and sore throat.   Respiratory: Negative.  Negative for cough and shortness of breath.   Cardiovascular: Negative.  Negative for chest pain and palpitations.  Gastrointestinal:  Negative for abdominal  pain, diarrhea, nausea and vomiting.  Genitourinary: Negative.  Negative for dysuria and hematuria.  Skin:  Positive for itching and rash.  Neurological: Negative.  Negative for dizziness and headaches.    Today's Vitals   08/06/24 1330  BP: 126/86  Pulse: (!) 115  Temp: 98.1 F (36.7 C)  TempSrc: Oral  SpO2: 94%  Weight: 178 lb (80.7 kg)  Height: 5' 5 (1.651 m)   Body mass index is 29.62 kg/m.   Physical Exam Constitutional:      Appearance: Normal appearance.  HENT:     Head: Normocephalic.  Eyes:     Extraocular Movements: Extraocular movements intact.  Cardiovascular:     Rate and Rhythm: Normal rate.  Pulmonary:     Effort: Pulmonary effort is normal.  Skin:    General: Skin is warm and dry.     Findings: Rash present.     Comments: Erythematous vesicular rash right T5 dermatome distribution mostly on breast area Nipple not affected  Neurological:     Mental Status: She is alert and oriented to person, place, and time.  Psychiatric:        Mood and Affect: Mood normal.        Behavior: Behavior normal.      ASSESSMENT & PLAN: Problem List Items Addressed This Visit       Nervous and Auditory   Herpes zoster without complication - Primary   T5 dermatome distribution right side Pain management discussed Recommend valacyclovir  1000 mg twice a day for 7 days Symptom management discussed Advised to contact the office if no better or worse during the next several days.  Relevant Medications   valACYclovir  (VALTREX ) 1000 MG tablet   Patient Instructions  Shingles  Shingles, or herpes zoster, is an infection. It gives you a skin rash and blisters. These infected areas may hurt a lot. Shingles only happens if: You've had chickenpox. You've been given a shot called a vaccine to protect you from getting chickenpox. Shingles is rare in this case. What are the causes? Shingles is caused by a germ called the varicella-zoster virus. This is the same  germ that causes chickenpox. After you're exposed to the germ, it stays in your body but is dormant. This means it isn't active. Shingles happens if the germ becomes active again. This can happen years after you're first exposed to the germ. What increases the risk? You may be more likely to get shingles if: You're older than 82 years of age. You're under a lot of stress. You have a weak immune system. The immune system is your body's defense system. It may be weak if: You have human immunodeficiency virus (HIV). You have acquired immunodeficiency syndrome (AIDS). You have cancer. You take medicines that weaken your immune system. These include organ transplant medicines. What are the signs or symptoms? The first symptoms of shingles may be itching, tingling, or pain. Your skin may feel like it's burning. A few days or weeks later, you'll get a rash. Here's what you can expect: The rash is likely to be on one side of your body. The rash may be shaped like a belt or a band. Over time, it will turn into blisters filled with fluid. The blisters will break open and change into scabs. The scabs will dry up in about 2-3 weeks. You may also have: A fever. Chills. A headache. Nausea. How is this diagnosed? Shingles is diagnosed with a skin exam. A sample called a culture may be taken from one of your blisters and sent to a lab. This will show if you have shingles. How is this treated? The rash may last for several weeks. There's no cure for shingles, but your health care provider may give you medicines. These medicines may: Help with pain. Help with itching. Help with irritation and swelling. Help you get better sooner. Help to prevent long-term problems. If the rash is on your face, you may need to see an eye doctor or an ear, nose, and throat (ENT) doctor. Follow these instructions at home: Medicines Take your medicines only as told by your provider. Put an anti-itch cream or numbing  cream on the rash or blisters as told by your provider. Relieving itching and discomfort  To help with itching: Put cold, wet cloths called cold compresses on the rash or blisters. Take a cool bath. Try adding baking soda or dry oatmeal to the water . Do not bathe in hot water . Use calamine lotion on the rash or blisters. You can get this type of lotion at the store. Blister and rash care Keep your rash covered with a loose bandage. Wear loose clothes that don't rub on your rash. Take care of your rash as told by your provider. Make sure you: Wash your hands with soap and water  for at least 20 seconds before and after you change your bandage. If you can't use soap and water , use hand sanitizer. Keep your rash and blisters clean by washing them with mild soap and cool water . Change your bandage. Check your rash every day for signs of infection. Check for: More redness, swelling, or pain. Fluid or blood. Warmth.  Pus or a bad smell. Do not scratch your rash. Do not pick at your blisters. To help you not scratch: Keep your fingernails clean and cut short. Try to wear gloves or mittens when you sleep. General instructions Rest. Wash your hands often with soap and water  for at least 20 seconds. If you can't use soap and water , use hand sanitizer. Washing your hands lowers your chance of getting a skin infection. Your infection can cause chickenpox in others. If you have blisters that aren't scabs yet, stay away from: Babies. Pregnant people. Children who have eczema. Older people who have organ transplants. People who have a long-term, or chronic, illness. Anyone who hasn't had chickenpox before. Anyone who hasn't gotten the chickenpox vaccine. How is this prevented? Vaccines are the best way to prevent you from getting chickenpox or shingles. Talk with your provider about getting these shots. Where to find more information Centers for Disease Control and Prevention (CDC):  TonerPromos.no Contact a health care provider if: Your pain doesn't get better with medicine. Your pain doesn't get better after the rash heals. You have any signs of infection around the rash. Your rash or blisters get worse. You have a fever or chills. Get help right away if: The rash is on your face or nose. You have pain in your face or by your eye. You lose feeling on one side of your face. You have trouble seeing. You have ear pain or ringing in your ear. This information is not intended to replace advice given to you by your health care provider. Make sure you discuss any questions you have with your health care provider. Document Revised: 07/20/2023 Document Reviewed: 12/02/2022 Elsevier Patient Education  2024 Elsevier Inc.    Emil Schaumann, MD [facility]

## 2024-08-06 NOTE — Assessment & Plan Note (Signed)
 T5 dermatome distribution right side Pain management discussed Recommend valacyclovir  1000 mg twice a day for 7 days Symptom management discussed Advised to contact the office if no better or worse during the next several days.

## 2024-08-12 ENCOUNTER — Other Ambulatory Visit: Payer: Self-pay

## 2024-08-16 ENCOUNTER — Other Ambulatory Visit: Payer: Self-pay | Admitting: Rheumatology

## 2024-08-16 ENCOUNTER — Other Ambulatory Visit: Payer: Self-pay

## 2024-08-16 DIAGNOSIS — L4050 Arthropathic psoriasis, unspecified: Secondary | ICD-10-CM

## 2024-08-16 DIAGNOSIS — Z79899 Other long term (current) drug therapy: Secondary | ICD-10-CM

## 2024-08-16 DIAGNOSIS — L409 Psoriasis, unspecified: Secondary | ICD-10-CM

## 2024-08-16 MED ORDER — SKYRIZI PEN 150 MG/ML ~~LOC~~ SOAJ
SUBCUTANEOUS | 0 refills | Status: DC
  Filled 2024-08-16: qty 1, fill #0
  Filled 2024-08-20: qty 1, 84d supply, fill #0

## 2024-08-16 NOTE — Telephone Encounter (Signed)
 Last Fill: 05/21/2024  Labs: 05/22/2024 CBC and CMP normal except glucose is mildly elevated.   TB Gold: 07/12/2023 Neg    Next Visit: 11/13/2024  Last Visit: 05/22/2024  DX: Psoriatic arthropathy   Current Dose per office note 05/22/2024: Skyrizi  150 mg sq injections every 12 weeks   Attempted to contact the patient and left message to advise patient she is due for labs.   Okay to refill Skyrizi ?

## 2024-08-20 ENCOUNTER — Other Ambulatory Visit (HOSPITAL_COMMUNITY): Payer: Self-pay

## 2024-08-20 ENCOUNTER — Other Ambulatory Visit: Payer: Self-pay

## 2024-08-20 NOTE — Progress Notes (Signed)
 Specialty Pharmacy Refill Coordination Note  Megan [name] is a 83 y.o. female contacted today regarding refills of specialty medication(s) Risankizumab -rzaa (Skyrizi  Pen)   Patient requested Delivery   Delivery date: 08/22/24   Verified address: [address]   [name] [id number]   Medication will be filled on 08/21/24.

## 2024-08-28 ENCOUNTER — Other Ambulatory Visit: Payer: Self-pay | Admitting: *Deleted

## 2024-08-28 DIAGNOSIS — Z79899 Other long term (current) drug therapy: Secondary | ICD-10-CM

## 2024-08-29 ENCOUNTER — Encounter: Payer: Self-pay | Admitting: Internal Medicine

## 2024-08-29 ENCOUNTER — Ambulatory Visit: Payer: Self-pay | Admitting: Physician Assistant

## 2024-08-29 DIAGNOSIS — N1831 Chronic kidney disease, stage 3a: Secondary | ICD-10-CM | POA: Insufficient documentation

## 2024-08-29 NOTE — Patient Instructions (Incomplete)
      Get your sugars better controlled.

## 2024-08-29 NOTE — Progress Notes (Signed)
 Glucose is elevated-184. Creatinine is elevated-1.34 and GFR is low-40-please clarify if she has been taking any NSAIDs?  CBC WNL

## 2024-08-29 NOTE — Progress Notes (Signed)
 Subjective:    Patient ID: Megan [name], female    DOB: 19-Sep-1941, 83 y.o.   MRN: [id number]     HPI Jamiesha is here for follow up of her chronic medical problems.  Had blood work 10/29 with Rheum  Has ckd 3a - she wanted to review this  Nightly she takes two tylenol  - does not take any other otc pain meds.   Right ear pain x 1 week.  Decreased hearing.    Pain down left pinky finger - feel like electric shock x 3 days.  It hurts when something touches it.  Denies any injury.      Medications and allergies reviewed with patient and updated if appropriate.  Current Outpatient Medications on File Prior to Visit  Medication Sig Dispense Refill   ACCU-CHEK GUIDE TEST test strip      albuterol  (VENTOLIN  HFA) 108 (90 Base) MCG/ACT inhaler Inhale 2 puffs into the lungs every 6 (six) hours as needed for wheezing or shortness of breath. 1 each 2   amLODipine  (NORVASC ) 5 MG tablet TAKE 1 TABLET BY MOUTH EVERY DAY 90 tablet 2   B-D UF III MINI PEN NEEDLES 31G X 5 MM MISC USE AS DIRECTED WITH INSULIN  4 TIMES A DAY 100 each 3   budesonide -formoterol  (SYMBICORT ) 80-4.5 MCG/ACT inhaler Inhale 2 puffs into the lungs as needed. 6.9 g 5   Cholecalciferol (VITAMIN D -3 PO) Take by mouth.     clobetasol  cream (TEMOVATE ) 0.05 % Apply 1 Application topically 2 (two) times daily. 30 g 0   Continuous Blood Gluc Sensor (DEXCOM G6 SENSOR) MISC Apply new sensor every 10 days for continuous glucose monitoring. 9 each 3   Continuous Blood Gluc Transmit (DEXCOM G6 TRANSMITTER) MISC Use one transmitter every 3 months for continuous glucose monitoring. 1 each 0   Cyanocobalamin  (VITAMIN B12) 1000 MCG TABS Take by mouth.     DULoxetine  (CYMBALTA ) 60 MG capsule Take 1 capsule (60 mg total) by mouth 2 (two) times daily. 180 capsule 3   fluocinolone (SYNALAR) 0.01 % external solution 1 Application.     insulin  aspart (NOVOLOG  FLEXPEN) 100 UNIT/ML FlexPen INJECT 18-22 UNITS UNDER THE SKIN THREE  TIMES DAILY 45 mL 3   insulin  degludec (TRESIBA  FLEXTOUCH) 100 UNIT/ML FlexTouch Pen Inject up to 36-38 units into the skin daily 30 mL 3   ketoconazole  (NIZORAL ) 2 % shampoo AS DIRECTED EXTERNALLY 2-3 TIME A WEEK 30 DAYS; Duration: 30     pravastatin  (PRAVACHOL ) 40 MG tablet TAKE 1 TABLET(40 MG) BY MOUTH EVERY EVENING 90 tablet 2   risankizumab -rzaa (SKYRIZI  PEN) 150 MG/ML pen Inject 150mg  into the skin every 12 weeks 1 mL 0   trimethoprim (TRIMPEX) 100 MG tablet Take 100 mg by mouth at bedtime.     YUVAFEM 10 MCG TABS vaginal tablet SMARTSIG:Tablet(s) Vaginal Twice a Week     No current facility-administered medications on file prior to visit.     Review of Systems     Objective:   Vitals:   08/30/24 1507  BP: 126/74  Pulse: (!) 110  Temp: 98 F (36.7 C)  SpO2: 95%   BP Readings from Last 3 Encounters:  08/30/24 126/74  08/06/24 126/86  06/26/24 112/64   Wt Readings from Last 3 Encounters:  08/06/24 178 lb (80.7 kg)  06/26/24 181 lb (82.1 kg)  05/30/24 179 lb (81.2 kg)   Body mass index is 29.62 kg/m.    Physical Exam Constitutional:  General: She is not in acute distress.    Appearance: Normal appearance.  HENT:     Head: Normocephalic and atraumatic.     Right Ear: Tympanic membrane, ear canal and external ear normal. There is no impacted cerumen.     Left Ear: Tympanic membrane, ear canal and external ear normal. There is no impacted cerumen.  Eyes:     Conjunctiva/sclera: Conjunctivae normal.  Cardiovascular:     Rate and Rhythm: Normal rate and regular rhythm.     Heart sounds: Normal heart sounds.  Pulmonary:     Effort: Pulmonary effort is normal. No respiratory distress.     Breath sounds: Normal breath sounds. No wheezing.  Musculoskeletal:        General: No swelling or deformity.     Cervical back: Neck supple.     Right lower leg: No edema.     Left lower leg: No edema.     Comments: Left pinky finger very tender to palpation posterior  surface   Lymphadenopathy:     Cervical: No cervical adenopathy.  Skin:    General: Skin is warm and dry.     Findings: No erythema or rash.  Neurological:     Mental Status: She is alert. Mental status is at baseline.  Psychiatric:        Mood and Affect: Mood normal.        Behavior: Behavior normal.        Lab Results  Component Value Date   WBC 9.4 08/28/2024   HGB 14.8 08/28/2024   HCT 45.3 (H) 08/28/2024   PLT 224 08/28/2024   GLUCOSE 184 (H) 08/28/2024   CHOL 150 12/28/2023   TRIG 111.0 12/28/2023   HDL 49.00 12/28/2023   LDLDIRECT 156.0 05/10/2019   LDLCALC 79 12/28/2023   ALT 19 08/28/2024   AST 18 08/28/2024   NA 141 08/28/2024   K 3.8 08/28/2024   CL 101 08/28/2024   CREATININE 1.34 (H) 08/28/2024   BUN 24 08/28/2024   CO2 30 08/28/2024   TSH 5.87 (H) 12/28/2023   INR 1.17 10/10/2018   HGBA1C 9.1 (H) 12/28/2023     Assessment & Plan:    See Problem List for Assessment and Plan of chronic medical problems.

## 2024-08-30 ENCOUNTER — Ambulatory Visit: Admitting: Internal Medicine

## 2024-08-30 VITALS — BP 126/74 | HR 110 | Temp 98.0°F | Ht 65.0 in

## 2024-08-30 DIAGNOSIS — E1122 Type 2 diabetes mellitus with diabetic chronic kidney disease: Secondary | ICD-10-CM | POA: Diagnosis not present

## 2024-08-30 DIAGNOSIS — H9201 Otalgia, right ear: Secondary | ICD-10-CM | POA: Diagnosis not present

## 2024-08-30 DIAGNOSIS — N1831 Chronic kidney disease, stage 3a: Secondary | ICD-10-CM

## 2024-08-30 DIAGNOSIS — E785 Hyperlipidemia, unspecified: Secondary | ICD-10-CM | POA: Diagnosis not present

## 2024-08-30 DIAGNOSIS — M792 Neuralgia and neuritis, unspecified: Secondary | ICD-10-CM

## 2024-08-30 DIAGNOSIS — I152 Hypertension secondary to endocrine disorders: Secondary | ICD-10-CM | POA: Diagnosis not present

## 2024-08-30 DIAGNOSIS — Z794 Long term (current) use of insulin: Secondary | ICD-10-CM

## 2024-08-30 DIAGNOSIS — E1169 Type 2 diabetes mellitus with other specified complication: Secondary | ICD-10-CM | POA: Diagnosis not present

## 2024-08-30 DIAGNOSIS — E1159 Type 2 diabetes mellitus with other circulatory complications: Secondary | ICD-10-CM | POA: Diagnosis not present

## 2024-08-30 DIAGNOSIS — E1149 Type 2 diabetes mellitus with other diabetic neurological complication: Secondary | ICD-10-CM

## 2024-08-30 LAB — CBC WITH DIFFERENTIAL/PLATELET
Absolute Lymphocytes: 2002 {cells}/uL (ref 850–3900)
Absolute Monocytes: 630 {cells}/uL (ref 200–950)
Basophils Absolute: 103 {cells}/uL (ref 0–200)
Basophils Relative: 1.1 %
Eosinophils Absolute: 197 {cells}/uL (ref 15–500)
Eosinophils Relative: 2.1 %
HCT: 45.3 % — ABNORMAL HIGH (ref 35.0–45.0)
Hemoglobin: 14.8 g/dL (ref 11.7–15.5)
MCH: 30 pg (ref 27.0–33.0)
MCHC: 32.7 g/dL (ref 32.0–36.0)
MCV: 91.7 fL (ref 80.0–100.0)
MPV: 10 fL (ref 7.5–12.5)
Monocytes Relative: 6.7 %
Neutro Abs: 6467 {cells}/uL (ref 1500–7800)
Neutrophils Relative %: 68.8 %
Platelets: 224 Thousand/uL (ref 140–400)
RBC: 4.94 Million/uL (ref 3.80–5.10)
RDW: 14.5 % (ref 11.0–15.0)
Total Lymphocyte: 21.3 %
WBC: 9.4 Thousand/uL (ref 3.8–10.8)

## 2024-08-30 LAB — COMPREHENSIVE METABOLIC PANEL WITH GFR
AG Ratio: 1.9 (calc) (ref 1.0–2.5)
ALT: 19 U/L (ref 6–29)
AST: 18 U/L (ref 10–35)
Albumin: 4 g/dL (ref 3.6–5.1)
Alkaline phosphatase (APISO): 111 U/L (ref 37–153)
BUN/Creatinine Ratio: 18 (calc) (ref 6–22)
BUN: 24 mg/dL (ref 7–25)
CO2: 30 mmol/L (ref 20–32)
Calcium: 9 mg/dL (ref 8.6–10.4)
Chloride: 101 mmol/L (ref 98–110)
Creat: 1.34 mg/dL — ABNORMAL HIGH (ref 0.60–0.95)
Globulin: 2.1 g/dL (ref 1.9–3.7)
Glucose, Bld: 184 mg/dL — ABNORMAL HIGH (ref 65–99)
Potassium: 3.8 mmol/L (ref 3.5–5.3)
Sodium: 141 mmol/L (ref 135–146)
Total Bilirubin: 0.6 mg/dL (ref 0.2–1.2)
Total Protein: 6.1 g/dL (ref 6.1–8.1)
eGFR: 40 mL/min/1.73m2 — ABNORMAL LOW (ref >=60)

## 2024-08-30 LAB — QUANTIFERON-TB GOLD PLUS
Mitogen-NIL: 5.25 [IU]/mL
NIL: 0.01 [IU]/mL
QuantiFERON-TB Gold Plus: NEGATIVE
TB1-NIL: 0 [IU]/mL
TB2-NIL: 0 [IU]/mL

## 2024-09-01 DIAGNOSIS — H9201 Otalgia, right ear: Secondary | ICD-10-CM | POA: Insufficient documentation

## 2024-09-01 DIAGNOSIS — M792 Neuralgia and neuritis, unspecified: Secondary | ICD-10-CM | POA: Insufficient documentation

## 2024-09-01 NOTE — Assessment & Plan Note (Signed)
 Chronic Associated with CKD 3a Management per endocrine-following with Harlene Bill Stressed compliance with a diabetic diet Currently on insulin 

## 2024-09-01 NOTE — Assessment & Plan Note (Signed)
Chronic Regular exercise and healthy diet encouraged Continue pravastatin 40 mg daily

## 2024-09-01 NOTE — Progress Notes (Signed)
 Recommend having BMP with GFR reached with us  or PCP within the next 2 weeks.

## 2024-09-01 NOTE — Progress Notes (Signed)
TB Gold negative

## 2024-09-01 NOTE — Assessment & Plan Note (Signed)
 Chronic Stable stage 3a Reviewed CKD Stressed better sugar control

## 2024-09-01 NOTE — Assessment & Plan Note (Signed)
 Acute Exam is normal - no excessive cerumen monitor

## 2024-09-01 NOTE — Assessment & Plan Note (Addendum)
 New Left pinky pain - nerve pain  - started 3 days ago ? Pinched nerve or diabetic neuropathy flare which she has No injury - tender to touch Discussed referral to ortho - she will monitor for now and if pain persists see hand Ortho

## 2024-09-01 NOTE — Assessment & Plan Note (Signed)
Chronic Blood pressure well controlled Continue amlodipine 5 mg daily 

## 2024-09-01 NOTE — Assessment & Plan Note (Signed)
 Chronic Stable - nerve pain in left had - ? Related to diabetes or pinched nerve Encouraged better diabetic control Continue Cymbalta  60 mg twice daily

## 2024-09-02 ENCOUNTER — Other Ambulatory Visit: Payer: Self-pay | Admitting: *Deleted

## 2024-09-02 DIAGNOSIS — Z79899 Other long term (current) drug therapy: Secondary | ICD-10-CM

## 2024-09-02 DIAGNOSIS — L409 Psoriasis, unspecified: Secondary | ICD-10-CM

## 2024-09-02 DIAGNOSIS — L4050 Arthropathic psoriasis, unspecified: Secondary | ICD-10-CM

## 2024-09-04 DIAGNOSIS — N952 Postmenopausal atrophic vaginitis: Secondary | ICD-10-CM | POA: Diagnosis not present

## 2024-09-04 DIAGNOSIS — N3020 Other chronic cystitis without hematuria: Secondary | ICD-10-CM | POA: Diagnosis not present

## 2024-09-04 DIAGNOSIS — R399 Unspecified symptoms and signs involving the genitourinary system: Secondary | ICD-10-CM | POA: Diagnosis not present

## 2024-09-09 ENCOUNTER — Other Ambulatory Visit (HOSPITAL_BASED_OUTPATIENT_CLINIC_OR_DEPARTMENT_OTHER): Payer: Self-pay

## 2024-09-09 DIAGNOSIS — G5622 Lesion of ulnar nerve, left upper limb: Secondary | ICD-10-CM | POA: Diagnosis not present

## 2024-09-09 DIAGNOSIS — R2232 Localized swelling, mass and lump, left upper limb: Secondary | ICD-10-CM | POA: Diagnosis not present

## 2024-09-09 DIAGNOSIS — M79645 Pain in left finger(s): Secondary | ICD-10-CM | POA: Diagnosis not present

## 2024-09-09 MED ORDER — COMIRNATY 30 MCG/0.3ML IM SUSY
0.3000 mL | PREFILLED_SYRINGE | Freq: Once | INTRAMUSCULAR | 0 refills | Status: AC
  Filled 2024-09-09: qty 0.3, 1d supply, fill #0

## 2024-09-16 ENCOUNTER — Other Ambulatory Visit (HOSPITAL_BASED_OUTPATIENT_CLINIC_OR_DEPARTMENT_OTHER): Payer: Self-pay | Admitting: Orthopedic Surgery

## 2024-09-16 DIAGNOSIS — R2232 Localized swelling, mass and lump, left upper limb: Secondary | ICD-10-CM

## 2024-09-18 DIAGNOSIS — H0014 Chalazion left upper eyelid: Secondary | ICD-10-CM | POA: Diagnosis not present

## 2024-09-18 DIAGNOSIS — E119 Type 2 diabetes mellitus without complications: Secondary | ICD-10-CM | POA: Diagnosis not present

## 2024-09-18 DIAGNOSIS — Z961 Presence of intraocular lens: Secondary | ICD-10-CM | POA: Diagnosis not present

## 2024-09-20 ENCOUNTER — Encounter (HOSPITAL_BASED_OUTPATIENT_CLINIC_OR_DEPARTMENT_OTHER): Payer: Self-pay

## 2024-09-20 ENCOUNTER — Ambulatory Visit (HOSPITAL_BASED_OUTPATIENT_CLINIC_OR_DEPARTMENT_OTHER)
Admission: RE | Admit: 2024-09-20 | Discharge: 2024-09-20 | Disposition: A | Discharge status: Home / Self Care | Source: Ambulatory Visit | Attending: Orthopedic Surgery | Admitting: Orthopedic Surgery

## 2024-09-20 DIAGNOSIS — R2232 Localized swelling, mass and lump, left upper limb: Secondary | ICD-10-CM

## 2024-09-27 ENCOUNTER — Other Ambulatory Visit (HOSPITAL_BASED_OUTPATIENT_CLINIC_OR_DEPARTMENT_OTHER): Payer: Self-pay

## 2024-09-27 MED ORDER — FLUZONE HIGH-DOSE 0.5 ML IM SUSY
0.5000 mL | PREFILLED_SYRINGE | Freq: Once | INTRAMUSCULAR | 0 refills | Status: AC
  Filled 2024-09-27: qty 0.5, 1d supply, fill #0

## 2024-10-27 ENCOUNTER — Other Ambulatory Visit: Payer: Self-pay | Admitting: Emergency Medicine

## 2024-10-27 DIAGNOSIS — B029 Zoster without complications: Secondary | ICD-10-CM

## 2024-11-04 NOTE — Progress Notes (Signed)
 "  Office Visit Note  Patient: Megan [name]             Date of Birth: 1941/03/26           MRN: [id number]             PCP: Geofm Glade PARAS, MD Referring: Geofm Glade PARAS, MD Visit Date: 11/13/2024 Occupation: Data Unavailable  Subjective:  Medication management  History of Present Illness: Megan [name] is a 84 y.o. female with psoriatic arthritis, psoriasis, and osteoarthritis.  She returns today after her last visit in July 2025.  She states Skyrizi  has been working well.  Her joints continue to improve.  She has not had any rash from psoriasis.  She continues to have some discomfort in the left knee joint.  She states she has fluid retention in her lower extremities but they are not painful.  She states she had mild shingles about 6 weeks ago under her right breast.  Use topical agent.    Activities of Daily Living:  Patient reports morning stiffness for 0 minutes.   Patient Reports nocturnal pain.  Difficulty dressing/grooming: Denies Difficulty climbing stairs: Denies Difficulty getting out of chair: Denies Difficulty using hands for taps, buttons, cutlery, and/or writing: Reports  Review of Systems  Constitutional:  Positive for fatigue.  HENT:  Negative for mouth sores and mouth dryness.   Eyes:  Negative for dryness.  Respiratory:  Positive for shortness of breath.   Cardiovascular:  Positive for swelling in legs/feet. Negative for chest pain and palpitations.  Gastrointestinal:  Negative for blood in stool, constipation and diarrhea.  Endocrine: Negative for increased urination.  Genitourinary:  Negative for involuntary urination.  Musculoskeletal:  Positive for joint pain, gait problem and joint pain. Negative for joint swelling, myalgias, muscle weakness, morning stiffness, muscle tenderness and myalgias.  Skin:  Positive for color change and hair loss. Negative for rash and sensitivity to sunlight.  Allergic/Immunologic: Negative for susceptible to  infections.  Neurological:  Positive for numbness. Negative for dizziness and headaches.  Hematological:  Negative for swollen glands.  Psychiatric/Behavioral:  Negative for depressed mood and sleep disturbance. The patient is not nervous/anxious.     PMFS History:  Patient Active Problem List   Diagnosis Date Noted   Neuropathic pain of finger of left hand 09/01/2024   Right ear pain 09/01/2024   Chronic kidney disease, stage 3a (HCC) 08/29/2024   Weight gain 12/29/2023   Vitamin B12 deficiency 06/14/2023   Asthma 06/14/2023   Subclinical hypothyroidism 06/13/2023   Primary osteoarthritis of left knee 04/10/2023   Keratosis 07/15/2022   Onychomycosis of nail of digit of hand 03/03/2022   Nocturia 11/24/2021   OAB (overactive bladder) 11/24/2021   Urge incontinence 11/24/2021   Urinary urgency 11/24/2021   Vaginal atrophy 11/24/2021   Vitamin D  deficiency 10/12/2021   Hyperlipidemia associated with type 2 diabetes mellitus (HCC) 10/12/2021   Frequent UTI 10/12/2021   Aortic atherosclerosis 10/09/2020   Fall 05/04/2020   Head pain 05/10/2019   GERD (gastroesophageal reflux disease) 10/17/2018   Herpes zoster without complication 03/01/2018   Right elbow pain 03/01/2018   Carpal tunnel syndrome on right 11/30/2017   Cough 08/22/2017   Primary osteoarthritis of both hands 03/08/2017   Poor balance 02/07/2017   Subarachnoid bleed (HCC) 01/11/2017   Psoriatic arthropathy (HCC) 09/08/2016   Hypertension associated with type 2 diabetes mellitus (HCC) 09/08/2016   Diabetic neuropathy (HCC) 09/08/2016   Diabetes mellitus with stage 3a chronic kidney  disease, with long-term current use of insulin  (HCC) 05/04/2016   Syncope 03/25/2016   RLS (restless legs syndrome) 12/11/2015   Lumbago 05/06/2015   Peripheral edema 04/14/2015   Psoriasis 04/14/2015   Emphysema of lung (HCC) 11/26/2014   Radiation-induced esophageal stricture 10/01/2014   DNR (do not resuscitate) discussion  08/21/2014   Fatigue 07/18/2014   Neuropathy due to chemotherapeutic drug 07/18/2014   Muscle cramping 07/18/2014   SOB (shortness of breath) 04/28/2014   Radiation esophagitis 04/10/2014   Cancer of middle lobe of lung (HCC) 01/17/2014   Malignant neoplasm of right upper lobe of lung (HCC) 11/28/2013    Past Medical History:  Diagnosis Date   Cancer (HCC) dx'd 09/2013   Lung ca   Concussion 2018   Diabetes (HCC) 09/08/2016   type I diabetic patient reported on 09/13/16    Diabetes mellitus    insulin    Diabetic neuropathy (HCC) 09/08/2016   Fall 2018   GERD (gastroesophageal reflux disease)    no meds for   History of colitis    History of hiatal hernia    Hx of radiation therapy 12/16/13-01/30/14   lung 66Gy   Hypertension    Malignant neoplasm of right upper lobe of lung (HCC) 11/28/2013   Neuropathy    Psoriasis    Psoriatic arthritis (HCC) 09/08/2016   Rheumatoid arthritis (HCC)    Shortness of breath dyspnea    with exertion   UTI (urinary tract infection) 03/31/2024   on going for 2 months    Family History  Problem Relation Age of Onset   Other Mother    Anuerysm Mother        brain   Other Father        failure to thrive   Hypertension Other    Stroke Other    Heart attack Other    Hemachromatosis Other        great grandfather   Rheum arthritis Other        both sets of grandparents and father   Migraines Neg Hx    Colon cancer Neg Hx    Colon polyps Neg Hx    Esophageal cancer Neg Hx    Pancreatic cancer Neg Hx    Stomach cancer Neg Hx    Liver disease Neg Hx    Past Surgical History:  Procedure Laterality Date   ABDOMINAL HYSTERECTOMY     ABDOMINAL SURGERY     BALLOON DILATION N/A 12/12/2014   Procedure: BALLOON DILATION;  Surgeon: Lamar JONETTA Aho, MD;  Location: [facility];  Service: Endoscopy;  Laterality: N/A;   BIOPSY  01/20/2020   Procedure: BIOPSY;  Surgeon: Legrand Victory LITTIE DOUGLAS, MD;  Location: [facility];  Service: Gastroenterology;;    CHOLECYSTECTOMY     COLONOSCOPY N/A 02/07/2016   Procedure: COLONOSCOPY;  Surgeon: Victory LITTIE Legrand DOUGLAS, MD;  Location: [facility] ENDOSCOPY;  Service: Endoscopy;  Laterality: N/A;   DILATION AND CURETTAGE OF UTERUS     ESOPHAGOGASTRODUODENOSCOPY N/A 12/12/2014   Procedure: ESOPHAGOGASTRODUODENOSCOPY (EGD);  Surgeon: Lamar JONETTA Aho, MD;  Location: [facility];  Service: Endoscopy;  Laterality: N/A;  with dilation   ESOPHAGOGASTRODUODENOSCOPY (EGD) WITH PROPOFOL  N/A 10/09/2014   Procedure: ESOPHAGOGASTRODUODENOSCOPY (EGD) WITH PROPOFOL ;  Surgeon: Lamar JONETTA Aho, MD;  Location: [facility]COPY;  Service: Endoscopy;  Laterality: N/A;   ESOPHAGOGASTRODUODENOSCOPY (EGD) WITH PROPOFOL  N/A 09/19/2016   Procedure: ESOPHAGOGASTRODUODENOSCOPY (EGD) WITH PROPOFOL ;  Surgeon: Victory LITTIE Legrand DOUGLAS, MD;  Location: [facility];  Service: Gastroenterology;  Laterality: N/A;  with savary dil tt   ESOPHAGOGASTRODUODENOSCOPY (EGD) WITH PROPOFOL  N/A 01/20/2020   Procedure: ESOPHAGOGASTRODUODENOSCOPY (EGD) WITH PROPOFOL ;  Surgeon: Legrand Victory LITTIE DOUGLAS, MD;  Location: [facility];  Service: Gastroenterology;  Laterality: N/A;  Fluoro-Savary   ORIF ANKLE FRACTURE Right 07/04/2015   Procedure: OPEN REDUCTION INTERNAL FIXATION (ORIF) ANKLE FRACTURE;  Surgeon: Jerona Harden GAILS, MD;  Location: [facility] OR;  Service: Orthopedics;  Laterality: Right;  OPEN REDUCTION, INTERNAL FIXATION OF RIGHT ANKLE FRACTURE.    ORIF ANKLE FRACTURE Left 02/10/2016   Procedure: OPEN REDUCTION INTERNAL FIXATION (ORIF) ANKLE FRACTURE;  Surgeon: Jerona Harden GAILS, MD;  Location: [facility] OR;  Service: Orthopedics;  Laterality: Left;   SAVORY DILATION N/A 10/09/2014   Procedure: SAVORY DILATION;  Surgeon: Lamar JONETTA Aho, MD;  Location: [facility] ENDOSCOPY;  Service: Endoscopy;  Laterality: N/A;   SAVORY DILATION N/A 09/19/2016   Procedure: SAVORY DILATION;  Surgeon: Victory LITTIE Legrand DOUGLAS, MD;  Location: [facility];  Service: Gastroenterology;  Laterality: N/A;   SAVORY DILATION N/A 01/20/2020    Procedure: SAVORY DILATION;  Surgeon: Legrand Victory LITTIE DOUGLAS, MD;  Location: [facility];  Service: Gastroenterology;  Laterality: N/A;   TONSILLECTOMY     Social History[1] Social History   Social History Narrative   Patient lives at home. She has an old friend who is her caregiver, Zachary Bevel, who stays with her 24/7.   Caffeine Use: 1 cups daily   Right handed     Immunization History  Administered Date(s) Administered   Fluad Quad(high Dose 65+) 07/31/2019, 10/01/2020, 07/15/2022   INFLUENZA, HIGH DOSE SEASONAL PF 08/12/2016, 08/30/2017, 09/07/2018, 08/24/2021, 09/27/2024   Influenza,inj,Quad PF,6+ Mos 11/07/2013, 07/14/2014, 08/25/2015   Influenza-Unspecified 08/07/2018, 09/07/2018   PFIZER(Purple Top)SARS-COV-2 Vaccination 11/28/2019, 12/25/2019, 06/18/2020   PPD Test 03/02/2016   Pfizer Covid-19 Vaccine Bivalent Booster 19yrs & up 07/27/2021   Pfizer(Comirnaty )Fall Seasonal Vaccine 12 years and older 08/17/2023, 09/09/2024   Pneumococcal Conjugate-13 05/10/2019   Pneumococcal Polysaccharide-23 03/19/2018   Pneumococcal-Unspecified 12/02/2015   Tdap 01/11/2017   Zoster Recombinant(Shingrix) 02/14/2018, 08/07/2018     Objective: Vital Signs: BP 110/73   Pulse (!) 111   Temp 97.6 F (36.4 C)   Resp 17   Ht 5' 5 (1.651 m)   Wt 178 lb 3.2 oz (80.8 kg)   BMI 29.65 kg/m    Physical Exam Vitals and nursing note reviewed.  Constitutional:      Appearance: She is well-developed.  HENT:     Head: Normocephalic and atraumatic.  Eyes:     Conjunctiva/sclera: Conjunctivae normal.  Cardiovascular:     Rate and Rhythm: Normal rate and regular rhythm.     Heart sounds: Normal heart sounds.  Pulmonary:     Effort: Pulmonary effort is normal.     Breath sounds: Normal breath sounds.  Abdominal:     General: Bowel sounds are normal.     Palpations: Abdomen is soft.  Musculoskeletal:     Cervical back: Normal range of motion.     Right lower leg: Edema present.      Left lower leg: Edema present.  Skin:    General: Skin is warm and dry.     Capillary Refill: Capillary refill takes less than 2 seconds.  Neurological:     Mental Status: She is alert and oriented to person, place, and time.  Psychiatric:        Behavior: Behavior normal.      Musculoskeletal Exam: She had limited lateral rotation of the cervical spine.  Thoracic kyphosis was noted without any tenderness.  She had no tenderness over the lumbar region.  Shoulders, elbows, wrist joints in good range of motion without any tenderness or synovitis.  She had bilateral CMC thickening and subluxation.  PIP and DIP thickening was noted.  She had limited range of motion of bilateral hip joints.  Knee joints in good range of motion without any warmth swelling or effusion.  She had bilateral lower extremity edema without any tenderness.  CDAI Exam: CDAI Score: -- Patient Global: --; Provider Global: -- Swollen: --; Tender: -- Joint Exam 11/13/2024   No joint exam has been documented for this visit   There is currently no information documented on the homunculus. Go to the Rheumatology activity and complete the homunculus joint exam.  Investigation: No additional findings.  Imaging: No results found.  Recent Labs: Lab Results  Component Value Date   WBC 9.4 08/28/2024   HGB 14.8 08/28/2024   PLT 224 08/28/2024   NA 141 08/28/2024   K 3.8 08/28/2024   CL 101 08/28/2024   CO2 30 08/28/2024   GLUCOSE 184 (H) 08/28/2024   BUN 24 08/28/2024   CREATININE 1.34 (H) 08/28/2024   BILITOT 0.6 08/28/2024   ALKPHOS 110 02/14/2024   AST 18 08/28/2024   ALT 19 08/28/2024   PROT 6.1 08/28/2024   ALBUMIN 3.8 02/14/2024   CALCIUM  9.0 08/28/2024   GFRAA 56 (L) 02/03/2020   QFTBGOLDPLUS NEGATIVE 08/28/2024    Speciality Comments: Remicade-anaphylaxis Previous treatment Otezla, sulfasalazine , Humira Skyrizi  July 25, 2023  Procedures:  No procedures performed Allergies:  Carboplatin , Remicade [infliximab], Other, Rifampin, and Hydromorphone    Assessment / Plan:     Visit Diagnoses: Psoriatic arthropathy (HCC) - dxd Dr. Ethel 2004.  initially treated with IV Remicade, switched to Humira 2010-anaphylactic IV Remicade.Humira d/c 2015 dxd with lung cancer-CTX,RTX.  Patient has been on Skyrizi  since September 2024.  It has been working well for her.  She has not had any joint swelling or joint inflammation.  No synovitis was noted on the examination today.  Psoriasis-she has not experienced any psoriasis flares.  High risk medication use - Skyrizi  150 mg sq injections every 12 weeks--Skyrizi  was initiated on 07/25/23. Previous therapy: Otezla,SSZ, remicade, and humira.  August 28, 2024 CBC and CMP were stable with creatinine slightly elevated at 1.34.  Will recheck labs today.  TB Gold was negative on August 28, 2024.  She was advised to get repeat labs in April and every 3 months.  Information reimmunization was placed in the AVS.  She was advised to hold Skyrizi  if she develops an infection resume after the infection resolves.  Primary osteoarthritis of both hands-she had bilateral PIP and DIP thickening with no synovitis.  Primary osteoarthritis of both knees-she had good range of motion of bilateral knee joints without any warmth swelling or effusion.  She has shuffling gait.  Lower extremity muscle strength exercises were demonstrated in the office.  Pes planus of both feet  Chronic midline low back pain without sciatica-she has some intermittent lower back discomfort.  No tenderness was noted on the examination.  Order strength exercises were demonstrated in the office.  Other medical problems are listed as follows:  Malignant neoplasm of right upper lobe of lung (HCC) - Dr. Gatha in 2015.  She was treated with chemotherapy and radiation therapy.  Radiation-induced esophageal stricture  LADA (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), managed as type 1  (HCC)  Neuropathy due to chemotherapeutic drug  History  of esophagitis  Aortic atherosclerosis  History of hypertension  History of subarachnoid hemorrhage  RLS (restless legs syndrome)  History of depression  Former smoker  Family history of psoriasis in maternal grandmother  Orders: Orders Placed This Encounter  Procedures   CBC with Differential/Platelet   Comprehensive metabolic panel with GFR   No orders of the defined types were placed in this encounter.    Follow-Up Instructions: Return in about 5 months (around 04/13/2025) for Psoriatic arthritis.   Maya Nash, MD  Note - This record has been created using Animal nutritionist.  Chart creation errors have been sought, but may not always  have been located. Such creation errors do not reflect on  the standard of medical care.     [1]  Social History Tobacco Use   Smoking status: Former    Current packs/day: 0.00    Average packs/day: 3.0 packs/day for 45.0 years (135.0 ttl pk-yrs)    Types: Cigarettes    Start date: 12/05/1952    Quit date: 12/05/1997    Years since quitting: 26.9    Passive exposure: Never   Smokeless tobacco: Never  Vaping Use   Vaping status: Never Used  Substance Use Topics   Alcohol use: Yes    Comment: rarely    Drug use: No   "

## 2024-11-07 ENCOUNTER — Other Ambulatory Visit: Payer: Self-pay

## 2024-11-07 ENCOUNTER — Other Ambulatory Visit: Payer: Self-pay | Admitting: Physician Assistant

## 2024-11-07 DIAGNOSIS — L409 Psoriasis, unspecified: Secondary | ICD-10-CM

## 2024-11-07 DIAGNOSIS — Z79899 Other long term (current) drug therapy: Secondary | ICD-10-CM

## 2024-11-07 DIAGNOSIS — L4050 Arthropathic psoriasis, unspecified: Secondary | ICD-10-CM

## 2024-11-07 MED ORDER — SKYRIZI PEN 150 MG/ML ~~LOC~~ SOAJ
SUBCUTANEOUS | 0 refills | Status: AC
  Filled 2024-11-08: qty 1, fill #0
  Filled 2024-11-11: qty 1, 84d supply, fill #0

## 2024-11-07 NOTE — Telephone Encounter (Signed)
 Last Fill: 08/16/2024  Labs: 08/28/2024 Glucose is elevated-184. Creatinine is elevated-1.34 and GFR is low-40-please clarify if she has been taking any NSAIDs?  CBC WNL  TB Gold: 08/28/2024  negative  Next Visit: 11/13/2024  Last Visit: 05/22/2024  IK:Ednmpjupr arthropathy   Current Dose per office note 05/22/2024: Skyrizi  150 mg sq injections every 12 weeks   Okay to refill Skyrizi ?

## 2024-11-08 ENCOUNTER — Other Ambulatory Visit: Payer: Self-pay

## 2024-11-08 ENCOUNTER — Other Ambulatory Visit (HOSPITAL_COMMUNITY): Payer: Self-pay

## 2024-11-11 ENCOUNTER — Other Ambulatory Visit: Payer: Self-pay

## 2024-11-13 ENCOUNTER — Ambulatory Visit: Attending: Rheumatology | Admitting: Rheumatology

## 2024-11-13 ENCOUNTER — Other Ambulatory Visit (HOSPITAL_COMMUNITY): Payer: Self-pay

## 2024-11-13 ENCOUNTER — Encounter: Payer: Self-pay | Admitting: Rheumatology

## 2024-11-13 VITALS — BP 110/73 | HR 111 | Temp 97.6°F | Resp 17 | Ht 65.0 in | Wt 178.2 lb

## 2024-11-13 DIAGNOSIS — M545 Low back pain, unspecified: Secondary | ICD-10-CM

## 2024-11-13 DIAGNOSIS — Z87891 Personal history of nicotine dependence: Secondary | ICD-10-CM

## 2024-11-13 DIAGNOSIS — G620 Drug-induced polyneuropathy: Secondary | ICD-10-CM

## 2024-11-13 DIAGNOSIS — Z8719 Personal history of other diseases of the digestive system: Secondary | ICD-10-CM | POA: Diagnosis not present

## 2024-11-13 DIAGNOSIS — M19042 Primary osteoarthritis, left hand: Secondary | ICD-10-CM

## 2024-11-13 DIAGNOSIS — L409 Psoriasis, unspecified: Secondary | ICD-10-CM

## 2024-11-13 DIAGNOSIS — E139 Other specified diabetes mellitus without complications: Secondary | ICD-10-CM | POA: Diagnosis not present

## 2024-11-13 DIAGNOSIS — C3411 Malignant neoplasm of upper lobe, right bronchus or lung: Secondary | ICD-10-CM

## 2024-11-13 DIAGNOSIS — M170 Bilateral primary osteoarthritis of knee: Secondary | ICD-10-CM

## 2024-11-13 DIAGNOSIS — M2141 Flat foot [pes planus] (acquired), right foot: Secondary | ICD-10-CM | POA: Diagnosis not present

## 2024-11-13 DIAGNOSIS — G8929 Other chronic pain: Secondary | ICD-10-CM

## 2024-11-13 DIAGNOSIS — Z8659 Personal history of other mental and behavioral disorders: Secondary | ICD-10-CM

## 2024-11-13 DIAGNOSIS — L4050 Arthropathic psoriasis, unspecified: Secondary | ICD-10-CM

## 2024-11-13 DIAGNOSIS — M2142 Flat foot [pes planus] (acquired), left foot: Secondary | ICD-10-CM

## 2024-11-13 DIAGNOSIS — Z840 Family history of diseases of the skin and subcutaneous tissue: Secondary | ICD-10-CM

## 2024-11-13 DIAGNOSIS — M19041 Primary osteoarthritis, right hand: Secondary | ICD-10-CM | POA: Diagnosis not present

## 2024-11-13 DIAGNOSIS — G2581 Restless legs syndrome: Secondary | ICD-10-CM

## 2024-11-13 DIAGNOSIS — K222 Esophageal obstruction: Secondary | ICD-10-CM

## 2024-11-13 DIAGNOSIS — Z8679 Personal history of other diseases of the circulatory system: Secondary | ICD-10-CM

## 2024-11-13 DIAGNOSIS — Z79899 Other long term (current) drug therapy: Secondary | ICD-10-CM

## 2024-11-13 DIAGNOSIS — T451X5A Adverse effect of antineoplastic and immunosuppressive drugs, initial encounter: Secondary | ICD-10-CM

## 2024-11-13 DIAGNOSIS — I700 Atherosclerosis of aorta: Secondary | ICD-10-CM

## 2024-11-13 NOTE — Patient Instructions (Signed)
 Standing Labs We placed an order today for your standing lab work.   Please have your standing labs drawn in April and every 3 months  Please have your labs drawn 2 weeks prior to your appointment so that the provider can discuss your lab results at your appointment, if possible.  Please note that you may see your imaging and lab results in MyChart before we have reviewed them. We will contact you once all results are reviewed. Please allow our office up to 72 hours to thoroughly review all of the results before contacting the office for clarification of your results.  WALK-IN LAB HOURS  Monday through Thursday from 8:00 am - 4:30 pm and Friday from 8:00 am-12:00 pm.  Patients with office visits requiring labs will be seen before walk-in labs.  You may encounter longer than normal wait times. Please allow additional time. Wait times may be shorter on  Monday and Thursday afternoons.  We do not book appointments for walk-in labs. We appreciate your patience and understanding with our staff.   Labs are drawn by [facility]. Please bring your co-pay at the time of your lab draw.  You may receive a bill from [facility] for your lab work.  Please note if you are on Hydroxychloroquine and and an order has been placed for a Hydroxychloroquine level,  you will need to have it drawn 4 hours or more after your last dose.  If you wish to have your labs drawn at another location, please call the office 24 hours in advance so we can fax the orders.  The office is located at [address], [place], [place] [address]   If you have any questions regarding directions or hours of operation,  please call [phone].   As a reminder, please drink plenty of water prior to coming for your lab work. Thanks!   Vaccines You are taking a medication(s) that can suppress your immune system.  The following immunizations are recommended: Flu annually Covid-19  RSV Td/Tdap (tetanus, diphtheria, pertussis)  every 10 years Pneumonia (Prevnar 15 then Pneumovax 23 at least 1 year apart.  Alternatively, can take Prevnar 20 without needing additional dose) Shingrix: 2 doses from 4 weeks to 6 months apart  Please check with your PCP to make sure you are up to date.   If you have signs or symptoms of an infection or start antibiotics: First, call your PCP for workup of your infection. Hold your medication through the infection, until you complete your antibiotics, and until symptoms resolve if you take the following: Injectable medication (Actemra, Benlysta, Cimzia, Cosentyx, Enbrel, Humira, Kevzara, Orencia, Remicade, Simponi, Stelara, Taltz , Tremfya) Methotrexate Leflunomide (Arava) Mycophenolate (Cellcept) Earma, Olumiant, or Rinvoq

## 2024-11-14 ENCOUNTER — Ambulatory Visit: Payer: Self-pay | Admitting: Rheumatology

## 2024-11-14 LAB — COMPREHENSIVE METABOLIC PANEL WITH GFR
AG Ratio: 1.8 (calc) (ref 1.0–2.5)
ALT: 15 U/L (ref 6–29)
AST: 16 U/L (ref 10–35)
Albumin: 4.1 g/dL (ref 3.6–5.1)
Alkaline phosphatase (APISO): 106 U/L (ref 37–153)
BUN/Creatinine Ratio: 16 (calc) (ref 6–22)
BUN: 18 mg/dL (ref 7–25)
CO2: 32 mmol/L (ref 20–32)
Calcium: 9.8 mg/dL (ref 8.6–10.4)
Chloride: 99 mmol/L (ref 98–110)
Creat: 1.14 mg/dL — ABNORMAL HIGH (ref 0.60–0.95)
Globulin: 2.3 g/dL (ref 1.9–3.7)
Glucose, Bld: 223 mg/dL — ABNORMAL HIGH (ref 65–99)
Potassium: 4 mmol/L (ref 3.5–5.3)
Sodium: 140 mmol/L (ref 135–146)
Total Bilirubin: 0.6 mg/dL (ref 0.2–1.2)
Total Protein: 6.4 g/dL (ref 6.1–8.1)
eGFR: 48 mL/min/1.73m2 — ABNORMAL LOW

## 2024-11-14 LAB — CBC WITH DIFFERENTIAL/PLATELET
Absolute Lymphocytes: 1838 {cells}/uL (ref 850–3900)
Absolute Monocytes: 582 {cells}/uL (ref 200–950)
Basophils Absolute: 91 {cells}/uL (ref 0–200)
Basophils Relative: 1 %
Eosinophils Absolute: 237 {cells}/uL (ref 15–500)
Eosinophils Relative: 2.6 %
HCT: 45.3 % (ref 35.9–46.0)
Hemoglobin: 14.7 g/dL (ref 11.7–15.5)
MCH: 29.7 pg (ref 27.0–33.0)
MCHC: 32.5 g/dL (ref 31.6–35.4)
MCV: 91.5 fL (ref 81.4–101.7)
MPV: 10.2 fL (ref 7.5–12.5)
Monocytes Relative: 6.4 %
Neutro Abs: 6352 {cells}/uL (ref 1500–7800)
Neutrophils Relative %: 69.8 %
Platelets: 220 Thousand/uL (ref 140–400)
RBC: 4.95 Million/uL (ref 3.80–5.10)
RDW: 13.8 % (ref 11.0–15.0)
Total Lymphocyte: 20.2 %
WBC: 9.1 Thousand/uL (ref 3.8–10.8)

## 2024-11-14 NOTE — Progress Notes (Signed)
 Glucose is elevated, creatinine is elevated but better.  CBC is normal.  Please forward results to her PCP.

## 2024-11-15 ENCOUNTER — Other Ambulatory Visit: Payer: Self-pay

## 2024-11-15 ENCOUNTER — Other Ambulatory Visit: Payer: Self-pay | Admitting: Pharmacy Technician

## 2024-11-15 NOTE — Progress Notes (Signed)
 Specialty Pharmacy Refill Coordination Note  Megan [name] is a 84 y.o. female contacted today regarding refills of specialty medication(s) Risankizumab -rzaa (Skyrizi  Pen)   Patient requested Delivery   Delivery date: 11/20/24   Verified address: [address]   [place] [address]   Medication will be filled on: 11/19/24

## 2024-11-18 ENCOUNTER — Other Ambulatory Visit: Payer: Self-pay

## 2024-11-19 ENCOUNTER — Other Ambulatory Visit: Payer: Self-pay

## 2024-11-20 ENCOUNTER — Other Ambulatory Visit: Payer: Self-pay

## 2024-11-21 ENCOUNTER — Other Ambulatory Visit: Payer: Self-pay

## 2024-11-22 ENCOUNTER — Other Ambulatory Visit (HOSPITAL_COMMUNITY): Payer: Self-pay

## 2025-02-12 ENCOUNTER — Inpatient Hospital Stay

## 2025-02-12 ENCOUNTER — Ambulatory Visit (HOSPITAL_COMMUNITY)

## 2025-02-19 ENCOUNTER — Inpatient Hospital Stay: Admitting: Internal Medicine

## 2025-04-16 ENCOUNTER — Ambulatory Visit: Admitting: Rheumatology

## 2025-06-06 ENCOUNTER — Ambulatory Visit
